# Patient Record
Sex: Male | Born: 1985 | Race: White | Hispanic: No | Marital: Single | Smoking: Current every day smoker
Health system: Southern US, Community
[De-identification: ages and names within clinical notes are randomized; demographics above are authoritative.]

## PROBLEM LIST (undated history)

## (undated) DIAGNOSIS — F319 Bipolar disorder, unspecified: Secondary | ICD-10-CM

## (undated) DIAGNOSIS — F32A Depression, unspecified: Secondary | ICD-10-CM

## (undated) DIAGNOSIS — G919 Hydrocephalus, unspecified: Secondary | ICD-10-CM

## (undated) DIAGNOSIS — E119 Type 2 diabetes mellitus without complications: Secondary | ICD-10-CM

## (undated) DIAGNOSIS — R569 Unspecified convulsions: Secondary | ICD-10-CM

## (undated) DIAGNOSIS — F329 Major depressive disorder, single episode, unspecified: Secondary | ICD-10-CM

## (undated) DIAGNOSIS — N200 Calculus of kidney: Secondary | ICD-10-CM

## (undated) DIAGNOSIS — F419 Anxiety disorder, unspecified: Secondary | ICD-10-CM

## (undated) DIAGNOSIS — E1042 Type 1 diabetes mellitus with diabetic polyneuropathy: Secondary | ICD-10-CM

## (undated) DIAGNOSIS — R278 Other lack of coordination: Secondary | ICD-10-CM

## (undated) HISTORY — PX: KIDNEY STONE SURGERY: SHX686

## (undated) HISTORY — DX: Other lack of coordination: R27.8

## (undated) HISTORY — PX: BRAIN SURGERY: SHX531

## (undated) NOTE — *Deleted (*Deleted)
Physical Medicine and Rehabilitation Admission H&P    Chief Complaint  Patient presents with  . Shortness of Breath  . Blood Sugar Problem  : HPI: Mark [name] is a 44 year old right-handed male with history of bipolar disorder, diabetes mellitus with frequent episodes of DKA, history of seizures maintained on Depakote, hydrocephalus, tobacco abuse.  At baseline patient reports being independent.  Presented 09/02/2020 after being found down by roommates.  Cranial CT scan showed persistent marked hydrocephalus involving the third and lateral ventricles with normal fourth ventricle and right frontal ventriculostomy site with drain.  Findings consistent with aqueductal stenosis.  EEG suggestive of severe diffuse encephalopathy no seizure activity.  Patient did require intubation for airway protection.  Admission chemistries potassium 7.0 glucose 646 creatinine 2.25, sodium 130, WBC 33,100, lactic acid 6.9, urine drug screen negative, ammonia level 38, AST 364, ALT 157, alcohol negative.  Neurology follow-up he was extubated 09/04/2020.  He remains on Depakote as prior to admission.  His diet has been advanced to mechanical soft.  Insulin therapy as directed with close monitoring of blood sugars.  His renal function is greatly improved with gentle IV hydration latest creatinine 0.94.  Initially maintained on broad-spectrum antibiotics leukocytosis improved 10,100.  Blood cultures no growth to date.  Therapy evaluations completed and patient was admitted for a comprehensive rehab program.  Review of Systems  Constitutional: Positive for diaphoresis. Negative for chills and fever.  HENT: Negative for hearing loss.   Eyes: Negative for blurred vision and double vision.  Respiratory: Negative for cough.   Cardiovascular: Positive for leg swelling. Negative for chest pain and palpitations.  Gastrointestinal: Positive for constipation. Negative for heartburn, nausea and vomiting.  Genitourinary:  Negative for dysuria, flank pain and hematuria.  Musculoskeletal: Positive for myalgias.  Skin: Negative for rash.  Neurological: Positive for seizures.  Psychiatric/Behavioral: Positive for depression. The patient has insomnia.        Anxiety, bipolar disorder  All other systems reviewed and are negative.  Past Medical History:  Diagnosis Date  . Anxiety   . Bipolar disorder (HCC)   . Depression   . Diabetes mellitus without complication (HCC)   . Diabetic polyneuropathy associated with type 1 diabetes mellitus (HCC)   . Hydrocephalus (HCC)   . Kidney stones   . Seizures (HCC)    Past Surgical History:  Procedure Laterality Date  . KIDNEY STONE SURGERY     Family History  Problem Relation Age of Onset  . Breast cancer Mother    Social History:  reports that he has been smoking cigarettes. He has a 20.00 pack-year smoking history. He has never used smokeless tobacco. He reports current alcohol use. He reports current drug use. Frequency: 7.00 times per week. Drug: Marijuana. Allergies:  Allergies  Allergen Reactions  . Mushroom Extract Complex Hives, Itching and Nausea And Vomiting  . Penicillins Anaphylaxis, Hives and Swelling    Has patient had a PCN reaction causing immediate rash, facial/tongue/throat swelling, SOB or lightheadedness with hypotension: Yes Has patient had a PCN reaction causing severe rash involving mucus membranes or skin necrosis: No Has patient had a PCN reaction that required hospitalization: Yes Has patient had a PCN reaction occurring within the last 10 years: Yes If all of the above answers are "NO", then may proceed with Cephalosporin use. Tolerated ceftriaxone 03/24/20  . Sulfa Antibiotics Anaphylaxis and Hives  . Clindamycin/Lincomycin Hives   Medications Prior to Admission  Medication Sig Dispense Refill  . insulin aspart protamine-  aspart (NOVOLOG MIX 70/30) (70-30) 100 UNIT/ML injection Inject 0.35 mLs (35 Units total) into the skin 2 (two)  times daily with a meal. 10 mL 0  . atorvastatin (LIPITOR) 20 MG tablet Take 1 tablet (20 mg total) by mouth daily. 30 tablet 1  . blood glucose meter kit and supplies KIT Dispense based on patient and insurance preference. Use up to four times daily as directed. (FOR ICD-9 250.00, 250.01). (Patient not taking: Reported on 06/20/2020) 1 each 0  . divalproex (DEPAKOTE) 250 MG DR tablet Take 3 tablets (750 mg total) by mouth every evening. For mood stabilization (Patient not taking: Reported on 06/20/2020) 90 tablet 1  . divalproex (DEPAKOTE) 500 MG DR tablet Take 1 tablet (500 mg total) by mouth daily. For mood stabilization (Patient not taking: Reported on 06/20/2020) 30 tablet 0  . escitalopram (LEXAPRO) 20 MG tablet Take 1 tablet (20 mg total) by mouth daily. For depression (Patient not taking: Reported on 06/20/2020) 30 tablet 0  . gabapentin (NEURONTIN) 100 MG capsule Take 100 mg by mouth 2 (two) times daily. (Patient not taking: Reported on 06/20/2020)    . Insulin Pen Needle (PEN NEEDLES) 31G X 5 MM MISC 1 Container by Does not apply route 4 (four) times daily -  with meals and at bedtime. 100 each 0  . Insulin Syringes, Disposable, U-100 0.5 ML MISC For insulin 100 each 0  . levothyroxine (SYNTHROID) 100 MCG tablet Take 1 tablet (100 mcg total) by mouth daily before breakfast. (Patient not taking: Reported on 06/20/2020) 30 tablet 0  . QUEtiapine (SEROQUEL) 300 MG tablet Take 1 tablet (300 mg total) by mouth at bedtime. For mood control (Patient not taking: Reported on 06/20/2020) 30 tablet 0    Drug Regimen Review Drug regimen was reviewed and remains appropriate with no significant issues identified  Home: Home Living Family/patient expects to be discharged to:: Shelter/Homeless Available Help at Discharge: Friend(s), Available PRN/intermittently (unsure of level of help at discharge) Type of Home: House Additional Comments: pt reporting to PT start of session he is homeless, later in session  reports was living with his mother until she passed approx 6 months ago   Lives With: Other (Comment) (housemate)   Functional History: Prior Function Level of Independence: Independent  Functional Status:  Mobility: Bed Mobility Overal bed mobility: Needs Assistance Bed Mobility: Rolling, Sidelying to Sit Rolling: Mod assist, +2 for physical assistance Sidelying to sit: Mod assist, +2 for physical assistance General bed mobility comments: Max assist trunk and LE management, scooting to EOB. Transfers Overall transfer level: Needs assistance Equipment used: Ambulation equipment used Transfer via Lift Equipment: Stedy Transfers: Sit to/from Stand Sit to Stand: Mod assist, +2 physical assistance Stand pivot transfers: Total assist, +2 physical assistance, +2 safety/equipment General transfer comment: max +2 for power up, hip extension, steadying, and sustained standing via posterior support. Stand x1 from EOB, x1 from stedy seat. Ambulation/Gait General Gait Details: unable this day    ADL: ADL Overall ADL's : Needs assistance/impaired Eating/Feeding: NPO Grooming: Maximal assistance, Sitting, Wash/dry face, Brushing hair Grooming Details (indicate cue type and reason): requires assist to support elbow/forearm for basic face washing task; requires hand over hand assist for combing hair due to decreased fine motor. using LUE to perform today  Upper Body Bathing: Moderate assistance, Sitting Lower Body Bathing: Total assistance, +2 for physical assistance, Bed level Lower Body Bathing Details (indicate cue type and reason): pt. incontinent of large BM, was unaware until reviewed with him.  Upper Body Dressing : Maximal assistance, Sitting, Bed level Upper Body Dressing Details (indicate cue type and reason): max inst. and hand over hand assistance to place arms in sleeves of gown Lower Body Dressing: Total assistance, +2 for physical assistance, Sitting/lateral leans, Sit to/from  stand, +2 for safety/equipment Toilet Transfer: Total assistance, +2 for physical assistance, +2 for safety/equipment, BSC Toilet Transfer Details (indicate cue type and reason): via Stedy, simulated via transfer to recliner  Toileting- Clothing Manipulation and Hygiene: Total assistance, +2 for physical assistance, +2 for safety/equipment, Bed level Functional mobility during ADLs: Maximal assistance, +2 for physical assistance, +2 for safety/equipment, Total assistance  Cognition: Cognition Overall Cognitive Status: No family/caregiver present to determine baseline cognitive functioning Arousal/Alertness: Awake/alert Orientation Level: Oriented to person, Oriented to situation, Disoriented to time, Disoriented to situation Attention: Sustained Sustained Attention: Impaired Sustained Attention Impairment: Verbal basic, Functional basic Memory: Impaired Memory Impairment: Other (comment) (recalled 1/5 words after 3 minute delay) Awareness: Impaired Awareness Impairment: Emergent impairment Problem Solving: Impaired Problem Solving Impairment: Verbal basic Behaviors: Impulsive, Verbal agitation Safety/Judgment: Impaired Cognition Arousal/Alertness: Awake/alert Behavior During Therapy: Flat affect Overall Cognitive Status: No family/caregiver present to determine baseline cognitive functioning General Comments: Pt did not respond to verbal cues alone. He required tactile cues and assistance physically to initiate all tasks. Pt smiles occasionally and laughs appropriately during session.   Physical Exam: Blood pressure 115/71, pulse 80, temperature 98.2 F (36.8 C), temperature source Oral, resp. rate 18, height 6\' 2"  (1.88 m), weight 97.4 kg, SpO2 96 %. Physical Exam Neurological:     Comments: Patient is alert.  Makes eye contact with examiner.  He  needed cues for day of the week but appropriate for year and month.  Follows simple commands.     Results for orders placed or  performed during the hospital encounter of 09/02/20 (from the past 48 hour(s))  Glucose, capillary     Status: Abnormal   Collection Time: 09/10/20 12:11 PM  Result Value Ref Range   Glucose-Capillary 270 (H) 70 - 99 mg/dL    Comment: Glucose reference range applies only to samples taken after fasting for at least 8 hours.  Glucose, capillary     Status: Abnormal   Collection Time: 09/10/20  4:17 PM  Result Value Ref Range   Glucose-Capillary 356 (H) 70 - 99 mg/dL    Comment: Glucose reference range applies only to samples taken after fasting for at least 8 hours.  Glucose, capillary     Status: Abnormal   Collection Time: 09/10/20  8:34 PM  Result Value Ref Range   Glucose-Capillary 247 (H) 70 - 99 mg/dL    Comment: Glucose reference range applies only to samples taken after fasting for at least 8 hours.  Basic metabolic panel     Status: Abnormal   Collection Time: 09/11/20  1:58 AM  Result Value Ref Range   Sodium 136 135 - 145 mmol/L   Potassium 4.2 3.5 - 5.1 mmol/L   Chloride 103 98 - 111 mmol/L   CO2 25 22 - 32 mmol/L   Glucose, Bld 326 (H) 70 - 99 mg/dL    Comment: Glucose reference range applies only to samples taken after fasting for at least 8 hours.   BUN 14 6 - 20 mg/dL   Creatinine, Ser 1.61 0.61 - 1.24 mg/dL   Calcium 9.3 8.9 - 09.6 mg/dL   GFR, Estimated >04 >54 mL/min    Comment: (NOTE) Calculated using the CKD-EPI Creatinine Equation (2021)  Anion gap 8 5 - 15    Comment: Performed at [facility], [address] [place], [place] [address]  CBC     Status: Abnormal   Collection Time: 09/11/20  1:58 AM  Result Value Ref Range   WBC 10.1 4.0 - 10.5 K/uL   RBC 3.93 (L) 4.22 - 5.81 MIL/uL   Hemoglobin 11.8 (L) 13.0 - 17.0 g/dL   HCT 56.2 (L) 39 - 52 %   MCV 89.1 80.0 - 100.0 fL   MCH 30.0 26.0 - 34.0 pg   MCHC 33.7 30.0 - 36.0 g/dL   RDW 13.0 86.5 - 78.4 %   Platelets 383 150 - 400 K/uL   nRBC 0.0 0.0 - 0.2 %    Comment: Performed at [facility], [address], [place], [place] [address]  Magnesium     Status: None   Collection Time: 09/11/20  1:58 AM  Result Value Ref Range   Magnesium 1.7 1.7 - 2.4 mg/dL    Comment: Performed at [facility], [address] [place], [place] [address]  Glucose, capillary     Status: Abnormal   Collection Time: 09/11/20  6:04 AM  Result Value Ref Range   Glucose-Capillary 335 (H) 70 - 99 mg/dL    Comment: Glucose reference range applies only to samples taken after fasting for at least 8 hours.  Glucose, capillary     Status: Abnormal   Collection Time: 09/11/20  8:27 AM  Result Value Ref Range   Glucose-Capillary 433 (H) 70 - 99 mg/dL    Comment: Glucose reference range applies only to samples taken after fasting for at least 8 hours.  Glucose, random     Status: Abnormal   Collection Time: 09/11/20  9:51 AM  Result Value Ref Range   Glucose, Bld 588 (HH) 70 - 99 mg/dL    Comment: Glucose reference range applies only to samples taken after fasting for at least 8 hours. CRITICAL RESULT CALLED TO, READ BACK BY AND VERIFIED WITH: [name],M RN @ 1038 09/11/20 LEONARD,A Performed at [facility], [address], [place], [place] [address]   Glucose, capillary     Status: Abnormal   Collection Time: 09/11/20 10:23 AM  Result Value Ref Range   Glucose-Capillary 468 (H) 70 - 99 mg/dL    Comment: Glucose reference range applies only to samples taken after fasting for at least 8 hours.  Glucose, capillary     Status: Abnormal   Collection Time: 09/11/20  2:02 PM  Result Value Ref Range   Glucose-Capillary 398 (H) 70 - 99 mg/dL    Comment: Glucose reference range applies only to samples taken after fasting for at least 8 hours.  Glucose, capillary     Status: Abnormal   Collection Time: 09/11/20  3:41 PM  Result Value Ref Range   Glucose-Capillary 175 (H) 70 - 99 mg/dL    Comment: Glucose reference range applies only to samples taken after fasting for at least 8 hours.   Glucose, capillary     Status: Abnormal   Collection Time: 09/11/20  6:35 PM  Result Value Ref Range   Glucose-Capillary 128 (H) 70 - 99 mg/dL    Comment: Glucose reference range applies only to samples taken after fasting for at least 8 hours.   Comment 1 Notify RN   Glucose, capillary     Status: Abnormal   Collection Time: 09/11/20  9:16 PM  Result Value Ref  Range   Glucose-Capillary 107 (H) 70 - 99 mg/dL    Comment: Glucose reference range applies only to samples taken after fasting for at least 8 hours.   Comment 1 Notify RN    Comment 2 Document in Chart   Glucose, capillary     Status: None   Collection Time: 09/12/20  6:08 AM  Result Value Ref Range   Glucose-Capillary 87 70 - 99 mg/dL    Comment: Glucose reference range applies only to samples taken after fasting for at least 8 hours.   Comment 1 Notify RN    Comment 2 Document in Chart   Glucose, capillary     Status: Abnormal   Collection Time: 09/12/20  9:13 AM  Result Value Ref Range   Glucose-Capillary 217 (H) 70 - 99 mg/dL    Comment: Glucose reference range applies only to samples taken after fasting for at least 8 hours.   Comment 1 Notify RN    Comment 2 Document in Chart    No results found.     Medical Problem List and Plan: 1.  Altered mental status secondary to metabolic encephalopathy chronic hydrocephalus/acute hypoxic respiratory failure compromised airway and aspiration pneumonitis  -patient may *** shower  -ELOS/Goals: *** 2.  Antithrombotics: -DVT/anticoagulation: SCDs  -antiplatelet therapy: N/A 3. Pain Management: Tylenol as needed 4. Mood: Lexapro 20 mg daily  -antipsychotic agents: Seroquel 300 mg nightly 5. Neuropsych: This patient is not capable of making decisions on his own behalf. 6. Skin/Wound Care: Routine skin checks 7. Fluids/Electrolytes/Nutrition: Routine in and outs with follow-up chemistries 8.  Seizure disorder.  Depakote 500 mg daily and 750 mg nightly 9.  Diabetes  mellitus peripheral neuropathy with multiple episodes of DKA.  NovoLog 70/30 35 units twice daily.  Check blood sugars before meals and at bedtime.  Diabetic teaching 10.  Hypothyroidism.  Synthroid 11.  Hyperlipidemia.  Lipitor 12.  History of tobacco abuse.  Counseling   ***  Charlton Amor, PA-C 09/12/2020

---

## 2004-08-18 ENCOUNTER — Emergency Department: Payer: Self-pay | Admitting: Emergency Medicine

## 2005-04-11 ENCOUNTER — Emergency Department: Payer: Self-pay | Admitting: Unknown Physician Specialty

## 2006-01-16 ENCOUNTER — Emergency Department: Payer: Self-pay | Admitting: Emergency Medicine

## 2006-01-17 ENCOUNTER — Emergency Department: Payer: Self-pay | Admitting: Internal Medicine

## 2006-02-07 ENCOUNTER — Emergency Department: Payer: Self-pay | Admitting: Emergency Medicine

## 2006-03-01 ENCOUNTER — Emergency Department: Payer: Self-pay | Admitting: Emergency Medicine

## 2006-04-28 ENCOUNTER — Emergency Department: Payer: Self-pay | Admitting: Emergency Medicine

## 2006-04-30 ENCOUNTER — Emergency Department: Payer: Self-pay | Admitting: Unknown Physician Specialty

## 2006-09-13 ENCOUNTER — Emergency Department: Payer: Self-pay | Admitting: Emergency Medicine

## 2006-10-30 ENCOUNTER — Emergency Department: Payer: Self-pay

## 2007-01-01 ENCOUNTER — Emergency Department: Payer: Self-pay | Admitting: Emergency Medicine

## 2007-01-09 ENCOUNTER — Emergency Department: Payer: Self-pay | Admitting: Emergency Medicine

## 2007-04-22 ENCOUNTER — Emergency Department: Payer: Self-pay | Admitting: Internal Medicine

## 2007-04-28 ENCOUNTER — Emergency Department: Payer: Self-pay | Admitting: Internal Medicine

## 2007-06-29 ENCOUNTER — Emergency Department: Payer: Self-pay | Admitting: Emergency Medicine

## 2007-07-20 ENCOUNTER — Emergency Department: Payer: Self-pay | Admitting: Emergency Medicine

## 2007-07-30 ENCOUNTER — Emergency Department: Payer: Self-pay | Admitting: Unknown Physician Specialty

## 2007-07-31 ENCOUNTER — Emergency Department: Payer: Self-pay | Admitting: Emergency Medicine

## 2007-08-02 ENCOUNTER — Emergency Department: Payer: Self-pay | Admitting: Emergency Medicine

## 2007-08-04 ENCOUNTER — Emergency Department: Payer: Self-pay | Admitting: Emergency Medicine

## 2007-10-18 ENCOUNTER — Emergency Department: Payer: Self-pay | Admitting: Emergency Medicine

## 2007-11-19 ENCOUNTER — Emergency Department: Payer: Self-pay | Admitting: Emergency Medicine

## 2007-12-06 ENCOUNTER — Emergency Department: Payer: Self-pay | Admitting: Emergency Medicine

## 2008-01-18 ENCOUNTER — Emergency Department: Payer: Self-pay | Admitting: Emergency Medicine

## 2008-03-05 ENCOUNTER — Emergency Department: Payer: Self-pay | Admitting: Unknown Physician Specialty

## 2008-04-07 ENCOUNTER — Emergency Department: Payer: Self-pay | Admitting: Unknown Physician Specialty

## 2008-05-30 ENCOUNTER — Emergency Department: Payer: Self-pay | Admitting: Emergency Medicine

## 2008-06-21 ENCOUNTER — Emergency Department: Payer: Self-pay | Admitting: Emergency Medicine

## 2008-07-04 ENCOUNTER — Emergency Department: Payer: Self-pay | Admitting: Emergency Medicine

## 2008-07-05 ENCOUNTER — Emergency Department: Payer: Self-pay | Admitting: Emergency Medicine

## 2008-07-31 ENCOUNTER — Emergency Department: Payer: Self-pay | Admitting: Emergency Medicine

## 2008-08-01 ENCOUNTER — Emergency Department: Payer: Self-pay | Admitting: Internal Medicine

## 2009-03-23 ENCOUNTER — Emergency Department: Payer: Self-pay | Admitting: Emergency Medicine

## 2009-04-12 ENCOUNTER — Emergency Department: Payer: Self-pay | Admitting: Emergency Medicine

## 2009-05-26 ENCOUNTER — Emergency Department: Payer: Self-pay | Admitting: Emergency Medicine

## 2009-05-30 ENCOUNTER — Emergency Department: Payer: Self-pay | Admitting: Emergency Medicine

## 2009-06-03 ENCOUNTER — Emergency Department: Payer: Self-pay | Admitting: Emergency Medicine

## 2009-06-10 ENCOUNTER — Emergency Department: Payer: Self-pay | Admitting: Emergency Medicine

## 2009-06-14 ENCOUNTER — Emergency Department: Payer: Self-pay

## 2009-06-19 ENCOUNTER — Emergency Department: Payer: Self-pay | Admitting: Unknown Physician Specialty

## 2009-06-28 ENCOUNTER — Emergency Department: Payer: Self-pay | Admitting: Emergency Medicine

## 2009-07-03 ENCOUNTER — Emergency Department: Payer: Self-pay | Admitting: Emergency Medicine

## 2009-07-06 ENCOUNTER — Emergency Department: Payer: Self-pay | Admitting: Emergency Medicine

## 2009-07-06 ENCOUNTER — Emergency Department: Payer: Self-pay | Admitting: Unknown Physician Specialty

## 2009-07-28 ENCOUNTER — Emergency Department: Payer: Self-pay | Admitting: Emergency Medicine

## 2009-08-06 ENCOUNTER — Emergency Department: Payer: Self-pay | Admitting: Emergency Medicine

## 2009-08-09 ENCOUNTER — Emergency Department: Payer: Self-pay | Admitting: Emergency Medicine

## 2009-08-18 ENCOUNTER — Ambulatory Visit: Payer: Self-pay | Admitting: Urology

## 2009-08-19 ENCOUNTER — Emergency Department: Payer: Self-pay | Admitting: Unknown Physician Specialty

## 2009-08-22 ENCOUNTER — Emergency Department: Payer: Self-pay | Admitting: Emergency Medicine

## 2009-09-12 ENCOUNTER — Emergency Department: Payer: Self-pay | Admitting: Emergency Medicine

## 2009-10-02 ENCOUNTER — Emergency Department: Payer: Self-pay | Admitting: Unknown Physician Specialty

## 2009-11-15 ENCOUNTER — Emergency Department: Payer: Self-pay | Admitting: Emergency Medicine

## 2009-11-18 ENCOUNTER — Emergency Department: Payer: Self-pay | Admitting: Emergency Medicine

## 2009-11-28 ENCOUNTER — Emergency Department: Payer: Self-pay | Admitting: Emergency Medicine

## 2009-12-10 ENCOUNTER — Emergency Department: Payer: Self-pay | Admitting: Emergency Medicine

## 2010-01-19 ENCOUNTER — Emergency Department: Payer: Self-pay | Admitting: Emergency Medicine

## 2010-02-14 ENCOUNTER — Emergency Department: Payer: Self-pay | Admitting: Emergency Medicine

## 2010-03-05 ENCOUNTER — Emergency Department: Payer: Self-pay | Admitting: Internal Medicine

## 2010-03-30 ENCOUNTER — Emergency Department: Payer: Self-pay | Admitting: Emergency Medicine

## 2010-04-10 ENCOUNTER — Emergency Department: Payer: Self-pay | Admitting: Emergency Medicine

## 2010-04-12 ENCOUNTER — Emergency Department: Payer: Self-pay | Admitting: Emergency Medicine

## 2010-07-12 ENCOUNTER — Emergency Department: Payer: Self-pay | Admitting: Emergency Medicine

## 2010-08-12 ENCOUNTER — Emergency Department: Payer: Self-pay | Admitting: Emergency Medicine

## 2010-08-13 ENCOUNTER — Emergency Department: Payer: Self-pay | Admitting: Emergency Medicine

## 2010-08-18 ENCOUNTER — Emergency Department: Payer: Self-pay | Admitting: Emergency Medicine

## 2010-08-19 ENCOUNTER — Emergency Department: Payer: Self-pay | Admitting: Unknown Physician Specialty

## 2010-09-07 ENCOUNTER — Emergency Department: Payer: Self-pay | Admitting: Emergency Medicine

## 2010-09-13 ENCOUNTER — Emergency Department: Payer: Self-pay | Admitting: Emergency Medicine

## 2010-09-19 ENCOUNTER — Emergency Department: Payer: Self-pay | Admitting: Emergency Medicine

## 2010-09-24 ENCOUNTER — Inpatient Hospital Stay: Payer: Self-pay | Admitting: Internal Medicine

## 2010-09-26 ENCOUNTER — Emergency Department: Payer: Self-pay | Admitting: Emergency Medicine

## 2010-10-03 ENCOUNTER — Emergency Department: Payer: Self-pay | Admitting: Emergency Medicine

## 2010-10-05 ENCOUNTER — Emergency Department: Payer: Self-pay

## 2010-10-09 ENCOUNTER — Emergency Department: Payer: Self-pay | Admitting: Unknown Physician Specialty

## 2010-10-12 ENCOUNTER — Emergency Department: Payer: Self-pay | Admitting: Emergency Medicine

## 2010-10-23 ENCOUNTER — Emergency Department: Payer: Self-pay | Admitting: Emergency Medicine

## 2010-11-11 ENCOUNTER — Emergency Department: Payer: Self-pay | Admitting: Unknown Physician Specialty

## 2010-11-25 ENCOUNTER — Inpatient Hospital Stay: Payer: Self-pay | Admitting: *Deleted

## 2010-12-10 ENCOUNTER — Emergency Department: Payer: Self-pay | Admitting: Emergency Medicine

## 2010-12-11 ENCOUNTER — Emergency Department: Payer: Self-pay | Admitting: Emergency Medicine

## 2010-12-18 ENCOUNTER — Emergency Department: Payer: Self-pay | Admitting: Emergency Medicine

## 2011-01-22 ENCOUNTER — Emergency Department: Payer: Self-pay | Admitting: Emergency Medicine

## 2011-03-11 ENCOUNTER — Emergency Department: Payer: Self-pay | Admitting: Emergency Medicine

## 2011-03-18 ENCOUNTER — Emergency Department: Payer: Self-pay | Admitting: Emergency Medicine

## 2011-03-26 ENCOUNTER — Emergency Department: Payer: Self-pay | Admitting: Emergency Medicine

## 2011-04-21 ENCOUNTER — Emergency Department: Payer: Self-pay | Admitting: Unknown Physician Specialty

## 2011-05-10 ENCOUNTER — Emergency Department: Payer: Self-pay | Admitting: Emergency Medicine

## 2011-05-11 ENCOUNTER — Emergency Department: Payer: Self-pay | Admitting: *Deleted

## 2011-06-06 ENCOUNTER — Emergency Department: Payer: Self-pay | Admitting: *Deleted

## 2011-06-12 ENCOUNTER — Emergency Department: Payer: Self-pay | Admitting: Emergency Medicine

## 2011-06-15 ENCOUNTER — Emergency Department: Payer: Self-pay | Admitting: *Deleted

## 2011-09-15 ENCOUNTER — Inpatient Hospital Stay: Payer: Self-pay | Admitting: Internal Medicine

## 2011-10-13 ENCOUNTER — Emergency Department: Payer: Self-pay | Admitting: *Deleted

## 2011-12-16 ENCOUNTER — Emergency Department: Payer: Self-pay | Admitting: Emergency Medicine

## 2012-02-10 ENCOUNTER — Inpatient Hospital Stay: Payer: Self-pay | Admitting: Internal Medicine

## 2012-02-10 LAB — URINALYSIS, COMPLETE
Bacteria: NONE SEEN
Bilirubin,UR: NEGATIVE
Squamous Epithelial: <1
WBC UR: 1 /HPF (ref 0–5)

## 2012-02-10 LAB — BASIC METABOLIC PANEL
Chloride: 92 mmol/L — ABNORMAL LOW (ref 98–107)
Creatinine: 1.69 mg/dL — ABNORMAL HIGH (ref 0.60–1.30)
EGFR (African American): >60
EGFR (Non-African Amer.): 54 — ABNORMAL LOW
Glucose: 579 mg/dL (ref 65–99)
Osmolality: 290 (ref 275–301)
Potassium: 5.5 mmol/L — ABNORMAL HIGH (ref 3.5–5.1)
Sodium: 130 mmol/L — ABNORMAL LOW (ref 136–145)

## 2012-02-10 LAB — CBC
HGB: 16 g/dL (ref 13.0–18.0)
MCV: 101 fL — ABNORMAL HIGH (ref 80–100)
Platelet: 335 10*3/uL (ref 150–440)
RBC: 5.09 10*6/uL (ref 4.40–5.90)

## 2012-02-11 LAB — DRUG SCREEN, URINE
Amphetamines, Ur Screen: NEGATIVE (ref ?–1000)
Barbiturates, Ur Screen: NEGATIVE (ref ?–200)
Benzodiazepine, Ur Scrn: NEGATIVE (ref ?–200)
Cannabinoid 50 Ng, Ur ~~LOC~~: POSITIVE (ref ?–50)
Cocaine Metabolite,Ur ~~LOC~~: NEGATIVE (ref ?–300)
Opiate, Ur Screen: POSITIVE (ref ?–300)
Tricyclic, Ur Screen: NEGATIVE (ref ?–1000)

## 2012-02-11 LAB — COMPREHENSIVE METABOLIC PANEL
Alkaline Phosphatase: 88 U/L (ref 50–136)
Anion Gap: 12 (ref 7–16)
BUN: 9 mg/dL (ref 7–18)
Bilirubin,Total: 0.5 mg/dL (ref 0.2–1.0)
Calcium, Total: 8.1 mg/dL — ABNORMAL LOW (ref 8.5–10.1)
Chloride: 106 mmol/L (ref 98–107)
Creatinine: 1.25 mg/dL (ref 0.60–1.30)
EGFR (African American): >60
Osmolality: 277 (ref 275–301)
Potassium: 3.7 mmol/L (ref 3.5–5.1)
SGPT (ALT): 17 U/L
Sodium: 137 mmol/L (ref 136–145)

## 2012-02-11 LAB — CBC WITH DIFFERENTIAL/PLATELET
Basophil #: 0.1 10*3/uL (ref 0.0–0.1)
Basophil %: 0.9 %
Eosinophil %: 0.8 %
HCT: 37.2 % — ABNORMAL LOW (ref 40.0–52.0)
HGB: 12.4 g/dL — ABNORMAL LOW (ref 13.0–18.0)
Lymphocyte #: 2.9 10*3/uL (ref 1.0–3.6)
Lymphocyte %: 21.3 %
MCH: 31.3 pg (ref 26.0–34.0)
MCHC: 33.4 g/dL (ref 32.0–36.0)
MCV: 94 fL (ref 80–100)
Monocyte #: 0.8 x10 3/mm (ref 0.2–1.0)
Neutrophil #: 9.9 10*3/uL — ABNORMAL HIGH (ref 1.4–6.5)
RDW: 12.7 % (ref 11.5–14.5)
WBC: 13.8 10*3/uL — ABNORMAL HIGH (ref 3.8–10.6)

## 2012-02-12 LAB — CBC WITH DIFFERENTIAL/PLATELET
Basophil %: 0.2 %
Eosinophil #: 0.2 10*3/uL (ref 0.0–0.7)
Eosinophil %: 1.7 %
HCT: 37 % — ABNORMAL LOW (ref 40.0–52.0)
HGB: 12.5 g/dL — ABNORMAL LOW (ref 13.0–18.0)
Lymphocyte #: 2.6 10*3/uL (ref 1.0–3.6)
MCH: 31.7 pg (ref 26.0–34.0)
MCHC: 33.9 g/dL (ref 32.0–36.0)
Monocyte #: 0.5 x10 3/mm (ref 0.2–1.0)
Monocyte %: 5.5 %
Neutrophil #: 6.5 10*3/uL (ref 1.4–6.5)
Neutrophil %: 66 %
Platelet: 165 10*3/uL (ref 150–440)
RDW: 12.8 % (ref 11.5–14.5)
WBC: 9.8 10*3/uL (ref 3.8–10.6)

## 2012-02-12 LAB — LIPID PANEL
HDL Cholesterol: 36 mg/dL — ABNORMAL LOW (ref 40–60)
Triglycerides: 332 mg/dL — ABNORMAL HIGH (ref 0–200)

## 2012-02-12 LAB — HEMOGLOBIN A1C: Hemoglobin A1C: 10.3 % — ABNORMAL HIGH (ref 4.2–6.3)

## 2012-02-12 LAB — GLUCOSE, RANDOM: Glucose: 298 mg/dL — ABNORMAL HIGH (ref 65–99)

## 2012-02-12 LAB — CALCIUM: Calcium, Total: 8.5 mg/dL (ref 8.5–10.1)

## 2012-02-20 ENCOUNTER — Inpatient Hospital Stay: Payer: Self-pay | Admitting: Internal Medicine

## 2012-02-20 LAB — BASIC METABOLIC PANEL
Anion Gap: 6 — ABNORMAL LOW (ref 7–16)
BUN: 16 mg/dL (ref 7–18)
Calcium, Total: 8.6 mg/dL (ref 8.5–10.1)
Chloride: 95 mmol/L — ABNORMAL LOW (ref 98–107)
Creatinine: 0.87 mg/dL (ref 0.60–1.30)
Glucose: 231 mg/dL — ABNORMAL HIGH (ref 65–99)
Osmolality: 271 (ref 275–301)
Sodium: 131 mmol/L — ABNORMAL LOW (ref 136–145)

## 2012-02-20 LAB — COMPREHENSIVE METABOLIC PANEL
BUN: 32 mg/dL — ABNORMAL HIGH (ref 7–18)
Bilirubin,Total: 0.4 mg/dL (ref 0.2–1.0)
Co2: 26 mmol/L (ref 21–32)
Creatinine: 1.14 mg/dL (ref 0.60–1.30)
EGFR (African American): >60
EGFR (Non-African Amer.): >60
Potassium: 4.9 mmol/L (ref 3.5–5.1)
SGPT (ALT): 40 U/L
Total Protein: 8.6 g/dL — ABNORMAL HIGH (ref 6.4–8.2)

## 2012-02-20 LAB — DRUG SCREEN, URINE
Barbiturates, Ur Screen: NEGATIVE (ref ?–200)
Cocaine Metabolite,Ur ~~LOC~~: NEGATIVE (ref ?–300)
MDMA (Ecstasy)Ur Screen: NEGATIVE (ref ?–500)
Tricyclic, Ur Screen: NEGATIVE (ref ?–1000)

## 2012-02-20 LAB — URINALYSIS, COMPLETE
Bacteria: NONE SEEN
Blood: NEGATIVE
Glucose,UR: >=500 mg/dL (ref 0–75)
Leukocyte Esterase: NEGATIVE
Nitrite: NEGATIVE
Ph: 7 (ref 4.5–8.0)
Protein: NEGATIVE
Specific Gravity: 1.026 (ref 1.003–1.030)

## 2012-02-20 LAB — CBC
HCT: 44.9 % (ref 40.0–52.0)
MCHC: 32.3 g/dL (ref 32.0–36.0)
MCV: 100 fL (ref 80–100)
WBC: 7.8 10*3/uL (ref 3.8–10.6)

## 2012-02-21 LAB — BASIC METABOLIC PANEL
Anion Gap: 6 — ABNORMAL LOW (ref 7–16)
BUN: 10 mg/dL (ref 7–18)
Calcium, Total: 8.5 mg/dL (ref 8.5–10.1)
Co2: 27 mmol/L (ref 21–32)
Creatinine: 0.71 mg/dL (ref 0.60–1.30)
EGFR (African American): >60
Glucose: 212 mg/dL — ABNORMAL HIGH (ref 65–99)
Osmolality: 272 (ref 275–301)
Sodium: 133 mmol/L — ABNORMAL LOW (ref 136–145)

## 2012-02-21 LAB — CBC WITH DIFFERENTIAL/PLATELET
Basophil %: 0.2 %
Eosinophil #: 0.2 10*3/uL (ref 0.0–0.7)
Eosinophil %: 2.5 %
HCT: 34.9 % — ABNORMAL LOW (ref 40.0–52.0)
Lymphocyte %: 37 %
MCH: 32.1 pg (ref 26.0–34.0)
MCHC: 33.6 g/dL (ref 32.0–36.0)
MCV: 95 fL (ref 80–100)
Monocyte #: 0.6 x10 3/mm (ref 0.2–1.0)
Monocyte %: 8.1 %
Neutrophil %: 52.2 %
Platelet: 244 10*3/uL (ref 150–440)
RDW: 13.3 % (ref 11.5–14.5)

## 2012-02-21 LAB — HEMOGLOBIN A1C: Hemoglobin A1C: 10.7 % — ABNORMAL HIGH (ref 4.2–6.3)

## 2012-02-22 LAB — LIPID PANEL
Cholesterol: 220 mg/dL — ABNORMAL HIGH (ref 0–200)
HDL Cholesterol: 45 mg/dL (ref 40–60)
Triglycerides: 207 mg/dL — ABNORMAL HIGH (ref 0–200)
VLDL Cholesterol, Calc: 41 mg/dL — ABNORMAL HIGH (ref 5–40)

## 2012-03-14 ENCOUNTER — Inpatient Hospital Stay: Payer: Self-pay | Admitting: Internal Medicine

## 2012-03-14 LAB — CBC WITH DIFFERENTIAL/PLATELET
HCT: 40.8 % (ref 40.0–52.0)
HGB: 13.3 g/dL (ref 13.0–18.0)
Lymphocyte %: 10.9 %
MCHC: 32.6 g/dL (ref 32.0–36.0)
MCV: 95 fL (ref 80–100)
Monocyte #: 1 x10 3/mm (ref 0.2–1.0)
Neutrophil #: 14.7 10*3/uL — ABNORMAL HIGH (ref 1.4–6.5)
Neutrophil %: 82.7 %
RDW: 13.6 % (ref 11.5–14.5)
WBC: 17.7 10*3/uL — ABNORMAL HIGH (ref 3.8–10.6)

## 2012-03-14 LAB — COMPREHENSIVE METABOLIC PANEL
Albumin: 3.7 g/dL (ref 3.4–5.0)
Alkaline Phosphatase: 116 U/L (ref 50–136)
Anion Gap: 10 (ref 7–16)
Calcium, Total: 9.3 mg/dL (ref 8.5–10.1)
Co2: 25 mmol/L (ref 21–32)
EGFR (African American): >60
Glucose: 385 mg/dL — ABNORMAL HIGH (ref 65–99)
Potassium: 4.2 mmol/L (ref 3.5–5.1)
Sodium: 132 mmol/L — ABNORMAL LOW (ref 136–145)
Total Protein: 8.8 g/dL — ABNORMAL HIGH (ref 6.4–8.2)

## 2012-03-15 LAB — BASIC METABOLIC PANEL
Calcium, Total: 8.6 mg/dL (ref 8.5–10.1)
Chloride: 96 mmol/L — ABNORMAL LOW (ref 98–107)
Co2: 28 mmol/L (ref 21–32)
Creatinine: 0.58 mg/dL — ABNORMAL LOW (ref 0.60–1.30)
EGFR (African American): >60
Glucose: 265 mg/dL — ABNORMAL HIGH (ref 65–99)
Osmolality: 271 (ref 275–301)
Potassium: 3.9 mmol/L (ref 3.5–5.1)

## 2012-03-15 LAB — HEPATIC FUNCTION PANEL A (ARMC)
Bilirubin, Direct: 0.1 mg/dL (ref 0.00–0.20)
Bilirubin,Total: 0.5 mg/dL (ref 0.2–1.0)
SGPT (ALT): 10 U/L — ABNORMAL LOW

## 2012-03-15 LAB — CBC WITH DIFFERENTIAL/PLATELET
Basophil #: 0 10*3/uL (ref 0.0–0.1)
Eosinophil %: 1.6 %
HCT: 36.9 % — ABNORMAL LOW (ref 40.0–52.0)
HGB: 12.4 g/dL — ABNORMAL LOW (ref 13.0–18.0)
Lymphocyte %: 19.6 %
MCH: 31.4 pg (ref 26.0–34.0)
WBC: 11.7 10*3/uL — ABNORMAL HIGH (ref 3.8–10.6)

## 2012-03-16 LAB — BASIC METABOLIC PANEL
Anion Gap: 9 (ref 7–16)
BUN: 6 mg/dL — ABNORMAL LOW (ref 7–18)
Calcium, Total: 8.7 mg/dL (ref 8.5–10.1)
Chloride: 100 mmol/L (ref 98–107)
Co2: 28 mmol/L (ref 21–32)
Creatinine: 0.61 mg/dL (ref 0.60–1.30)
EGFR (African American): >60
EGFR (Non-African Amer.): >60
Glucose: 124 mg/dL — ABNORMAL HIGH (ref 65–99)
Osmolality: 273 (ref 275–301)
Potassium: 3.9 mmol/L (ref 3.5–5.1)
Sodium: 137 mmol/L (ref 136–145)

## 2012-03-16 LAB — CBC WITH DIFFERENTIAL/PLATELET
Basophil #: 0 10*3/uL (ref 0.0–0.1)
Basophil %: 0.2 %
Eosinophil #: 0.2 10*3/uL (ref 0.0–0.7)
Eosinophil %: 2.5 %
HCT: 34.4 % — ABNORMAL LOW (ref 40.0–52.0)
HGB: 11.8 g/dL — ABNORMAL LOW (ref 13.0–18.0)
Lymphocyte #: 2.2 10*3/uL (ref 1.0–3.6)
Lymphocyte %: 31.2 %
MCH: 31.8 pg (ref 26.0–34.0)
MCHC: 34.3 g/dL (ref 32.0–36.0)
MCV: 93 fL (ref 80–100)
Monocyte %: 6.5 %
Neutrophil #: 4.1 10*3/uL (ref 1.4–6.5)
Neutrophil %: 59.6 %
Platelet: 313 10*3/uL (ref 150–440)
RDW: 13.3 % (ref 11.5–14.5)
WBC: 6.9 10*3/uL (ref 3.8–10.6)

## 2012-03-18 LAB — PROT IMMUNOELECTROPHORES(ARMC)

## 2012-03-19 LAB — WOUND CULTURE

## 2012-04-20 ENCOUNTER — Inpatient Hospital Stay: Payer: Self-pay | Admitting: Internal Medicine

## 2012-04-20 LAB — CBC WITH DIFFERENTIAL/PLATELET
Basophil #: 0.1 10*3/uL (ref 0.0–0.1)
Basophil %: 0.5 %
Eosinophil #: 0.2 10*3/uL (ref 0.0–0.7)
Eosinophil %: 1.8 %
HCT: 39.4 % — ABNORMAL LOW (ref 40.0–52.0)
HGB: 12.5 g/dL — ABNORMAL LOW (ref 13.0–18.0)
Lymphocyte #: 2.1 10*3/uL (ref 1.0–3.6)
Lymphocyte %: 15.5 %
MCH: 31.1 pg (ref 26.0–34.0)
MCHC: 31.7 g/dL — ABNORMAL LOW (ref 32.0–36.0)
MCV: 98 fL (ref 80–100)
Monocyte #: 1.1 x10 3/mm — ABNORMAL HIGH (ref 0.2–1.0)
Monocyte %: 7.8 %
WBC: 13.5 10*3/uL — ABNORMAL HIGH (ref 3.8–10.6)

## 2012-04-20 LAB — COMPREHENSIVE METABOLIC PANEL
Alkaline Phosphatase: 93 U/L (ref 50–136)
Anion Gap: 24 — ABNORMAL HIGH (ref 7–16)
BUN: 24 mg/dL — ABNORMAL HIGH (ref 7–18)
Bilirubin,Total: 0.5 mg/dL (ref 0.2–1.0)
Chloride: 91 mmol/L — ABNORMAL LOW (ref 98–107)
Co2: 15 mmol/L — ABNORMAL LOW (ref 21–32)
EGFR (African American): >60
EGFR (Non-African Amer.): >60
Glucose: 684 mg/dL (ref 65–99)
Osmolality: 297 (ref 275–301)
Potassium: 4.7 mmol/L (ref 3.5–5.1)
SGOT(AST): 14 U/L — ABNORMAL LOW (ref 15–37)

## 2012-04-20 LAB — DRUG SCREEN, URINE
Barbiturates, Ur Screen: NEGATIVE (ref ?–200)
Cocaine Metabolite,Ur ~~LOC~~: NEGATIVE (ref ?–300)
MDMA (Ecstasy)Ur Screen: NEGATIVE (ref ?–500)
Methadone, Ur Screen: NEGATIVE (ref ?–300)
Tricyclic, Ur Screen: NEGATIVE (ref ?–1000)

## 2012-04-20 LAB — URINALYSIS, COMPLETE
Bilirubin,UR: NEGATIVE
Glucose,UR: >=500 mg/dL (ref 0–75)
Ph: 6 (ref 4.5–8.0)
Protein: NEGATIVE
Specific Gravity: 1.021 (ref 1.003–1.030)
WBC UR: <1 /HPF (ref 0–5)

## 2012-04-20 LAB — PROTIME-INR: Prothrombin Time: 12.8 secs (ref 11.5–14.7)

## 2012-04-21 LAB — CBC WITH DIFFERENTIAL/PLATELET
Basophil %: 0.6 %
Eosinophil %: 2.7 %
HGB: 10.5 g/dL — ABNORMAL LOW (ref 13.0–18.0)
Lymphocyte %: 26.5 %
MCH: 29.4 pg (ref 26.0–34.0)
MCHC: 31.5 g/dL — ABNORMAL LOW (ref 32.0–36.0)
MCV: 93 fL (ref 80–100)
Monocyte %: 8 %
Neutrophil #: 6.7 10*3/uL — ABNORMAL HIGH (ref 1.4–6.5)
Neutrophil %: 62.2 %
Platelet: 366 10*3/uL (ref 150–440)
RBC: 3.56 10*6/uL — ABNORMAL LOW (ref 4.40–5.90)
WBC: 10.8 10*3/uL — ABNORMAL HIGH (ref 3.8–10.6)

## 2012-04-21 LAB — COMPREHENSIVE METABOLIC PANEL
Anion Gap: 7 (ref 7–16)
BUN: 11 mg/dL (ref 7–18)
Calcium, Total: 8.4 mg/dL — ABNORMAL LOW (ref 8.5–10.1)
Co2: 26 mmol/L (ref 21–32)
EGFR (Non-African Amer.): >60
Osmolality: 270 (ref 275–301)
Potassium: 3.4 mmol/L — ABNORMAL LOW (ref 3.5–5.1)
SGOT(AST): 13 U/L — ABNORMAL LOW (ref 15–37)
Sodium: 136 mmol/L (ref 136–145)

## 2012-04-22 LAB — CBC WITH DIFFERENTIAL/PLATELET
Basophil #: 0 10*3/uL (ref 0.0–0.1)
Basophil %: 0.4 %
Eosinophil #: 0.3 10*3/uL (ref 0.0–0.7)
Eosinophil %: 2.7 %
HCT: 32.8 % — ABNORMAL LOW (ref 40.0–52.0)
HGB: 10.9 g/dL — ABNORMAL LOW (ref 13.0–18.0)
Lymphocyte %: 24.8 %
MCH: 31.3 pg (ref 26.0–34.0)
Monocyte #: 0.8 x10 3/mm (ref 0.2–1.0)
Neutrophil #: 6.3 10*3/uL (ref 1.4–6.5)
Neutrophil %: 64.3 %
RBC: 3.5 10*6/uL — ABNORMAL LOW (ref 4.40–5.90)

## 2012-04-22 LAB — FERRITIN: Ferritin (ARMC): 188 ng/mL (ref 8–388)

## 2012-04-22 LAB — IRON AND TIBC
Iron Saturation: 19 %
Iron: 41 ug/dL — ABNORMAL LOW (ref 65–175)

## 2012-06-17 ENCOUNTER — Inpatient Hospital Stay: Payer: Self-pay | Admitting: Internal Medicine

## 2012-06-17 LAB — BASIC METABOLIC PANEL
Anion Gap: 17 — ABNORMAL HIGH (ref 7–16)
Anion Gap: 10 (ref 7–16)
BUN: 22 mg/dL — ABNORMAL HIGH (ref 7–18)
BUN: 16 mg/dL (ref 7–18)
Calcium, Total: 9.8 mg/dL (ref 8.5–10.1)
Calcium, Total: 9.8 mg/dL (ref 8.5–10.1)
Chloride: 97 mmol/L — ABNORMAL LOW (ref 98–107)
Co2: 18 mmol/L — ABNORMAL LOW (ref 21–32)
Co2: 19 mmol/L — ABNORMAL LOW (ref 21–32)
Co2: 25 mmol/L (ref 21–32)
Creatinine: 1.33 mg/dL — ABNORMAL HIGH (ref 0.60–1.30)
EGFR (African American): >60
EGFR (African American): >60
EGFR (Non-African Amer.): >60
EGFR (Non-African Amer.): 60 — ABNORMAL LOW
Glucose: 927 mg/dL (ref 65–99)
Glucose: 255 mg/dL — ABNORMAL HIGH (ref 65–99)
Osmolality: 297 (ref 275–301)
Osmolality: 283 (ref 275–301)
Potassium: 3.7 mmol/L (ref 3.5–5.1)
Potassium: 4.1 mmol/L (ref 3.5–5.1)
Sodium: 123 mmol/L — ABNORMAL LOW (ref 136–145)
Sodium: 132 mmol/L — ABNORMAL LOW (ref 136–145)

## 2012-06-17 LAB — CBC
HCT: 42.1 % (ref 40.0–52.0)
HGB: 13.4 g/dL (ref 13.0–18.0)
MCH: 31 pg (ref 26.0–34.0)
MCV: 98 fL (ref 80–100)
Platelet: 377 10*3/uL (ref 150–440)
RBC: 4.32 10*6/uL — ABNORMAL LOW (ref 4.40–5.90)
WBC: 18.4 10*3/uL — ABNORMAL HIGH (ref 3.8–10.6)

## 2012-06-17 LAB — URINALYSIS, COMPLETE
Leukocyte Esterase: NEGATIVE
Nitrite: NEGATIVE
Ph: 5 (ref 4.5–8.0)
Protein: NEGATIVE
RBC,UR: NONE SEEN /HPF (ref 0–5)
WBC UR: NONE SEEN /HPF (ref 0–5)

## 2012-06-18 LAB — BASIC METABOLIC PANEL
Anion Gap: 8 (ref 7–16)
BUN: 11 mg/dL (ref 7–18)
Calcium, Total: 9 mg/dL (ref 8.5–10.1)
Chloride: 101 mmol/L (ref 98–107)
Co2: 28 mmol/L (ref 21–32)
Creatinine: 0.75 mg/dL (ref 0.60–1.30)
EGFR (Non-African Amer.): >60
Glucose: 174 mg/dL — ABNORMAL HIGH (ref 65–99)
Osmolality: 277 (ref 275–301)
Potassium: 3.3 mmol/L — ABNORMAL LOW (ref 3.5–5.1)
Sodium: 137 mmol/L (ref 136–145)

## 2012-07-04 LAB — CBC WITH DIFFERENTIAL/PLATELET
Basophil #: 0 10*3/uL (ref 0.0–0.1)
Basophil %: 0.3 %
Eosinophil %: 0.6 %
HCT: 44.3 % (ref 40.0–52.0)
HGB: 14.8 g/dL (ref 13.0–18.0)
Lymphocyte #: 1.8 10*3/uL (ref 1.0–3.6)
Lymphocyte %: 12.7 %
MCH: 32.1 pg (ref 26.0–34.0)
MCV: 96 fL (ref 80–100)
Monocyte %: 2.9 %
Neutrophil #: 12.2 10*3/uL — ABNORMAL HIGH (ref 1.4–6.5)
RBC: 4.6 10*6/uL (ref 4.40–5.90)
WBC: 14.6 10*3/uL — ABNORMAL HIGH (ref 3.8–10.6)

## 2012-07-04 LAB — BASIC METABOLIC PANEL
Creatinine: 1.05 mg/dL (ref 0.60–1.30)
EGFR (African American): >60
EGFR (Non-African Amer.): >60
Osmolality: 284 (ref 275–301)
Sodium: 129 mmol/L — ABNORMAL LOW (ref 136–145)

## 2012-07-05 ENCOUNTER — Inpatient Hospital Stay: Payer: Self-pay | Admitting: Student

## 2012-07-05 LAB — CBC WITH DIFFERENTIAL/PLATELET
Basophil %: 0.2 %
Eosinophil #: 0 10*3/uL (ref 0.0–0.7)
Eosinophil %: 0 %
HCT: 43.6 % (ref 40.0–52.0)
HGB: 14.8 g/dL (ref 13.0–18.0)
Lymphocyte #: 2.3 10*3/uL (ref 1.0–3.6)
Lymphocyte %: 11.7 %
MCH: 31.6 pg (ref 26.0–34.0)
MCHC: 34 g/dL (ref 32.0–36.0)
MCV: 93 fL (ref 80–100)
Neutrophil #: 16.6 10*3/uL — ABNORMAL HIGH (ref 1.4–6.5)
Neutrophil %: 83 %
Platelet: 452 10*3/uL — ABNORMAL HIGH (ref 150–440)
RDW: 13.2 % (ref 11.5–14.5)

## 2012-07-05 LAB — BASIC METABOLIC PANEL
BUN: 19 mg/dL — ABNORMAL HIGH (ref 7–18)
Calcium, Total: 9.4 mg/dL (ref 8.5–10.1)
Creatinine: 1.16 mg/dL (ref 0.60–1.30)
EGFR (African American): >60
EGFR (Non-African Amer.): >60
Glucose: 133 mg/dL — ABNORMAL HIGH (ref 65–99)
Osmolality: 272 (ref 275–301)
Potassium: 4.5 mmol/L (ref 3.5–5.1)
Sodium: 134 mmol/L — ABNORMAL LOW (ref 136–145)

## 2012-07-05 LAB — URINALYSIS, COMPLETE
Blood: NEGATIVE
Ph: 5 (ref 4.5–8.0)
Protein: NEGATIVE
RBC,UR: <1 /HPF (ref 0–5)
WBC UR: <1 /HPF (ref 0–5)

## 2012-07-06 LAB — COMPREHENSIVE METABOLIC PANEL
Albumin: 3.2 g/dL — ABNORMAL LOW (ref 3.4–5.0)
Alkaline Phosphatase: 121 U/L (ref 50–136)
Anion Gap: 7 (ref 7–16)
BUN: 4 mg/dL — ABNORMAL LOW (ref 7–18)
Bilirubin,Total: 0.4 mg/dL (ref 0.2–1.0)
Calcium, Total: 9 mg/dL (ref 8.5–10.1)
Creatinine: 0.89 mg/dL (ref 0.60–1.30)
Glucose: 209 mg/dL — ABNORMAL HIGH (ref 65–99)
Osmolality: 273 (ref 275–301)
Potassium: 4.1 mmol/L (ref 3.5–5.1)
SGPT (ALT): 12 U/L (ref 12–78)
Sodium: 135 mmol/L — ABNORMAL LOW (ref 136–145)
Total Protein: 7.4 g/dL (ref 6.4–8.2)

## 2012-07-06 LAB — CBC WITH DIFFERENTIAL/PLATELET
Basophil %: 0.2 %
Eosinophil #: 0.1 10*3/uL (ref 0.0–0.7)
Eosinophil %: 0.8 %
HCT: 36.2 % — ABNORMAL LOW (ref 40.0–52.0)
HGB: 12.3 g/dL — ABNORMAL LOW (ref 13.0–18.0)
Lymphocyte #: 2.5 10*3/uL (ref 1.0–3.6)
Lymphocyte %: 19.5 %
MCV: 93 fL (ref 80–100)
Monocyte %: 5.8 %
Neutrophil #: 9.5 10*3/uL — ABNORMAL HIGH (ref 1.4–6.5)
Platelet: 298 10*3/uL (ref 150–440)
RBC: 3.88 10*6/uL — ABNORMAL LOW (ref 4.40–5.90)
WBC: 12.8 10*3/uL — ABNORMAL HIGH (ref 3.8–10.6)

## 2012-07-21 ENCOUNTER — Emergency Department: Payer: Self-pay | Admitting: Emergency Medicine

## 2012-08-19 ENCOUNTER — Inpatient Hospital Stay: Payer: Self-pay | Admitting: Internal Medicine

## 2012-08-19 LAB — URINALYSIS, COMPLETE
Bacteria: NONE SEEN
Glucose,UR: >=500 mg/dL (ref 0–75)
Leukocyte Esterase: NEGATIVE
Nitrite: NEGATIVE
Ph: 5 (ref 4.5–8.0)
Protein: NEGATIVE
Specific Gravity: 1.021 (ref 1.003–1.030)
WBC UR: NONE SEEN /HPF (ref 0–5)

## 2012-08-19 LAB — COMPREHENSIVE METABOLIC PANEL
Albumin: 4.1 g/dL (ref 3.4–5.0)
Alkaline Phosphatase: 148 U/L — ABNORMAL HIGH (ref 50–136)
Anion Gap: 29 — ABNORMAL HIGH (ref 7–16)
BUN: 20 mg/dL — ABNORMAL HIGH (ref 7–18)
Bilirubin,Total: 0.6 mg/dL (ref 0.2–1.0)
Co2: 8 mmol/L — CL (ref 21–32)
EGFR (African American): >60
EGFR (Non-African Amer.): >60
Glucose: 750 mg/dL (ref 65–99)
Osmolality: 296 (ref 275–301)
Potassium: 5.3 mmol/L — ABNORMAL HIGH (ref 3.5–5.1)
SGOT(AST): 14 U/L — ABNORMAL LOW (ref 15–37)
SGPT (ALT): 13 U/L (ref 12–78)

## 2012-08-19 LAB — CBC WITH DIFFERENTIAL/PLATELET
Basophil #: 0 10*3/uL (ref 0.0–0.1)
Basophil %: 0.2 %
Eosinophil #: 0 10*3/uL (ref 0.0–0.7)
Eosinophil %: 0.1 %
Lymphocyte #: 1.3 10*3/uL (ref 1.0–3.6)
Lymphocyte %: 5.5 %
MCH: 31.5 pg (ref 26.0–34.0)
MCHC: 31.8 g/dL — ABNORMAL LOW (ref 32.0–36.0)
MCV: 99 fL (ref 80–100)
Monocyte #: 0.3 x10 3/mm (ref 0.2–1.0)
Monocyte %: 1.5 %
Neutrophil #: 21.2 10*3/uL — ABNORMAL HIGH (ref 1.4–6.5)
Neutrophil %: 92.7 %
Platelet: 391 10*3/uL (ref 150–440)
RBC: 4.55 10*6/uL (ref 4.40–5.90)
RDW: 13.1 % (ref 11.5–14.5)
WBC: 22.8 10*3/uL — ABNORMAL HIGH (ref 3.8–10.6)

## 2012-08-20 LAB — BASIC METABOLIC PANEL
Anion Gap: 22 — ABNORMAL HIGH (ref 7–16)
Anion Gap: 12 (ref 7–16)
BUN: 20 mg/dL — ABNORMAL HIGH (ref 7–18)
Calcium, Total: 9.3 mg/dL (ref 8.5–10.1)
Calcium, Total: 9.3 mg/dL (ref 8.5–10.1)
Calcium, Total: 8.7 mg/dL (ref 8.5–10.1)
Chloride: 105 mmol/L (ref 98–107)
Chloride: 110 mmol/L — ABNORMAL HIGH (ref 98–107)
Co2: 14 mmol/L — ABNORMAL LOW (ref 21–32)
Co2: 20 mmol/L — ABNORMAL LOW (ref 21–32)
EGFR (African American): >60
EGFR (Non-African Amer.): 59 — ABNORMAL LOW
EGFR (Non-African Amer.): >60
Glucose: 315 mg/dL — ABNORMAL HIGH (ref 65–99)
Glucose: 211 mg/dL — ABNORMAL HIGH (ref 65–99)
Osmolality: 296 (ref 275–301)
Osmolality: 286 (ref 275–301)
Osmolality: 281 (ref 275–301)
Potassium: 3.9 mmol/L (ref 3.5–5.1)
Potassium: 4 mmol/L (ref 3.5–5.1)

## 2012-08-20 LAB — CBC WITH DIFFERENTIAL/PLATELET
Basophil #: 0.2 10*3/uL — ABNORMAL HIGH (ref 0.0–0.1)
Eosinophil %: 0.1 %
HGB: 13.7 g/dL (ref 13.0–18.0)
Lymphocyte %: 9.8 %
MCH: 31.6 pg (ref 26.0–34.0)
MCV: 92 fL (ref 80–100)
Monocyte #: 1.8 x10 3/mm — ABNORMAL HIGH (ref 0.2–1.0)
Monocyte %: 7.1 %
Platelet: 373 10*3/uL (ref 150–440)
RBC: 4.34 10*6/uL — ABNORMAL LOW (ref 4.40–5.90)
RDW: 12.2 % (ref 11.5–14.5)
WBC: 25.8 10*3/uL — ABNORMAL HIGH (ref 3.8–10.6)

## 2012-08-21 LAB — VANCOMYCIN, TROUGH: Vancomycin, Trough: 12 ug/mL (ref 10–20)

## 2012-08-22 LAB — CBC WITH DIFFERENTIAL/PLATELET
Basophil #: 0 10*3/uL (ref 0.0–0.1)
Basophil %: 0.3 %
Eosinophil #: 0.1 10*3/uL (ref 0.0–0.7)
HCT: 37.4 % — ABNORMAL LOW (ref 40.0–52.0)
HGB: 13.2 g/dL (ref 13.0–18.0)
MCH: 32.1 pg (ref 26.0–34.0)
MCHC: 35.3 g/dL (ref 32.0–36.0)
MCV: 91 fL (ref 80–100)
Monocyte #: 0.5 x10 3/mm (ref 0.2–1.0)
Neutrophil #: 4.6 10*3/uL (ref 1.4–6.5)
Neutrophil %: 62.4 %
RDW: 12.6 % (ref 11.5–14.5)

## 2012-08-24 LAB — CULTURE, BLOOD (SINGLE)

## 2012-09-06 ENCOUNTER — Inpatient Hospital Stay: Payer: Self-pay | Admitting: Internal Medicine

## 2012-09-06 LAB — BASIC METABOLIC PANEL
BUN: 14 mg/dL (ref 7–18)
Chloride: 101 mmol/L (ref 98–107)
Creatinine: 0.96 mg/dL (ref 0.60–1.30)
Osmolality: 273 (ref 275–301)
Potassium: 4 mmol/L (ref 3.5–5.1)
Sodium: 134 mmol/L — ABNORMAL LOW (ref 136–145)

## 2012-09-06 LAB — COMPREHENSIVE METABOLIC PANEL
Albumin: 4.7 g/dL (ref 3.4–5.0)
Alkaline Phosphatase: 130 U/L (ref 50–136)
Bilirubin,Total: 0.9 mg/dL (ref 0.2–1.0)
Calcium, Total: 9.8 mg/dL (ref 8.5–10.1)
Co2: 13 mmol/L — ABNORMAL LOW (ref 21–32)
Creatinine: 1.55 mg/dL — ABNORMAL HIGH (ref 0.60–1.30)
EGFR (African American): >60
EGFR (Non-African Amer.): >60
Glucose: 839 mg/dL (ref 65–99)
Osmolality: 298 (ref 275–301)
SGPT (ALT): 15 U/L (ref 12–78)

## 2012-09-06 LAB — URINALYSIS, COMPLETE
Bacteria: NONE SEEN
Bilirubin,UR: NEGATIVE
Leukocyte Esterase: NEGATIVE
Nitrite: NEGATIVE
Protein: NEGATIVE
RBC,UR: NONE SEEN /HPF (ref 0–5)
WBC UR: NONE SEEN /HPF (ref 0–5)

## 2012-09-06 LAB — CBC
HCT: 47.2 % (ref 40.0–52.0)
HGB: 14.7 g/dL (ref 13.0–18.0)
MCH: 31.4 pg (ref 26.0–34.0)
MCHC: 31.2 g/dL — ABNORMAL LOW (ref 32.0–36.0)
MCV: 101 fL — ABNORMAL HIGH (ref 80–100)
RDW: 13.5 % (ref 11.5–14.5)

## 2012-09-06 LAB — TSH: Thyroid Stimulating Horm: 4.83 u[IU]/mL — ABNORMAL HIGH

## 2012-09-06 LAB — CK TOTAL AND CKMB (NOT AT ARMC)
CK, Total: 41 U/L (ref 35–232)
CK-MB: <0.5 ng/mL — ABNORMAL LOW (ref 0.5–3.6)

## 2012-09-06 LAB — TROPONIN I: Troponin-I: <0.02 ng/mL

## 2012-09-07 LAB — BASIC METABOLIC PANEL
BUN: 11 mg/dL (ref 7–18)
Calcium, Total: 9 mg/dL (ref 8.5–10.1)
Chloride: 99 mmol/L (ref 98–107)
EGFR (African American): >60
Glucose: 202 mg/dL — ABNORMAL HIGH (ref 65–99)
Potassium: 3.7 mmol/L (ref 3.5–5.1)
Sodium: 132 mmol/L — ABNORMAL LOW (ref 136–145)

## 2012-09-07 LAB — CBC WITH DIFFERENTIAL/PLATELET
Basophil #: 0 x10 3/mm 3
Basophil %: 0.4 %
Eosinophil #: 0.2 x10 3/mm 3
Eosinophil %: 1.7 %
HCT: 37.3 % — ABNORMAL LOW
HGB: 12.1 g/dL — ABNORMAL LOW
Lymphocyte %: 26.4 %
Lymphs Abs: 3.1 x10 3/mm 3
MCH: 30.3 pg
MCHC: 32.4 g/dL
MCV: 93 fL
Monocyte #: 0.8 "x10 3/mm "
Monocyte %: 7.1 %
Neutrophil #: 7.5 x10 3/mm 3 — ABNORMAL HIGH
Neutrophil %: 64.4 %
Platelet: 335 x10 3/mm 3
RBC: 4 x10 6/mm 3 — ABNORMAL LOW
RDW: 13.3 %
WBC: 11.6 x10 3/mm 3 — ABNORMAL HIGH

## 2012-09-08 LAB — URINE CULTURE

## 2012-09-12 LAB — CULTURE, BLOOD (SINGLE)

## 2012-10-23 ENCOUNTER — Inpatient Hospital Stay: Payer: Self-pay | Admitting: Family Medicine

## 2012-10-23 LAB — URINALYSIS, COMPLETE
Bacteria: NONE SEEN
Bilirubin,UR: NEGATIVE
Glucose,UR: >=500 mg/dL (ref 0–75)
Nitrite: NEGATIVE
RBC,UR: 1 /HPF (ref 0–5)
Specific Gravity: 1.023 (ref 1.003–1.030)
Squamous Epithelial: NONE SEEN
WBC UR: 1 /HPF (ref 0–5)

## 2012-10-23 LAB — COMPREHENSIVE METABOLIC PANEL
Bilirubin,Total: 0.8 mg/dL (ref 0.2–1.0)
Calcium, Total: 9.5 mg/dL (ref 8.5–10.1)
Chloride: 90 mmol/L — ABNORMAL LOW (ref 98–107)
Co2: 9 mmol/L — CL (ref 21–32)
Creatinine: 1.8 mg/dL — ABNORMAL HIGH (ref 0.60–1.30)
EGFR (African American): 58 — ABNORMAL LOW
Potassium: 5.1 mmol/L (ref 3.5–5.1)
SGPT (ALT): 17 U/L (ref 12–78)
Sodium: 132 mmol/L — ABNORMAL LOW (ref 136–145)
Total Protein: 9.3 g/dL — ABNORMAL HIGH (ref 6.4–8.2)

## 2012-10-23 LAB — BASIC METABOLIC PANEL
Anion Gap: 10 (ref 7–16)
Calcium, Total: 9.5 mg/dL (ref 8.5–10.1)
Chloride: 104 mmol/L (ref 98–107)
Co2: 13 mmol/L — ABNORMAL LOW (ref 21–32)
Co2: 24 mmol/L (ref 21–32)
Creatinine: 1.63 mg/dL — ABNORMAL HIGH (ref 0.60–1.30)
Creatinine: 1.6 mg/dL — ABNORMAL HIGH (ref 0.60–1.30)
EGFR (Non-African Amer.): 56 — ABNORMAL LOW
EGFR (Non-African Amer.): 58 — ABNORMAL LOW
Glucose: 524 mg/dL (ref 65–99)
Osmolality: 302 (ref 275–301)
Osmolality: 286 (ref 275–301)
Potassium: 4.1 mmol/L (ref 3.5–5.1)
Potassium: 3 mmol/L — ABNORMAL LOW (ref 3.5–5.1)
Sodium: 135 mmol/L — ABNORMAL LOW (ref 136–145)
Sodium: 138 mmol/L (ref 136–145)

## 2012-10-23 LAB — CBC WITH DIFFERENTIAL/PLATELET
Basophil #: 0.1 10*3/uL (ref 0.0–0.1)
Basophil %: 0.3 %
Eosinophil #: 0 10*3/uL (ref 0.0–0.7)
Eosinophil %: 0 %
HCT: 49.6 % (ref 40.0–52.0)
Lymphocyte #: 1.6 10*3/uL (ref 1.0–3.6)
Lymphocyte %: 6.8 %
MCH: 31.5 pg (ref 26.0–34.0)
MCHC: 31.6 g/dL — ABNORMAL LOW (ref 32.0–36.0)
MCV: 100 fL (ref 80–100)
Neutrophil #: 21.1 10*3/uL — ABNORMAL HIGH (ref 1.4–6.5)
Neutrophil %: 90.3 %
Platelet: 356 10*3/uL (ref 150–440)

## 2012-10-24 LAB — COMPREHENSIVE METABOLIC PANEL
Albumin: 3.9 g/dL (ref 3.4–5.0)
Anion Gap: 15 (ref 7–16)
BUN: 23 mg/dL — ABNORMAL HIGH (ref 7–18)
Bilirubin,Total: 0.7 mg/dL (ref 0.2–1.0)
Calcium, Total: 9.2 mg/dL (ref 8.5–10.1)
Chloride: 100 mmol/L (ref 98–107)
Creatinine: 1.32 mg/dL — ABNORMAL HIGH (ref 0.60–1.30)
EGFR (African American): >60
EGFR (Non-African Amer.): >60
Glucose: 335 mg/dL — ABNORMAL HIGH (ref 65–99)
Potassium: 3.9 mmol/L (ref 3.5–5.1)
SGOT(AST): 11 U/L — ABNORMAL LOW (ref 15–37)
SGPT (ALT): 13 U/L (ref 12–78)
Sodium: 136 mmol/L (ref 136–145)
Total Protein: 7.5 g/dL (ref 6.4–8.2)

## 2012-10-24 LAB — CBC WITH DIFFERENTIAL/PLATELET
Basophil #: 0.1 10*3/uL (ref 0.0–0.1)
Basophil %: 0.3 %
Eosinophil %: 0.1 %
HCT: 40.7 % (ref 40.0–52.0)
HGB: 13.2 g/dL (ref 13.0–18.0)
Lymphocyte %: 12 %
Monocyte #: 1.3 x10 3/mm — ABNORMAL HIGH (ref 0.2–1.0)
Monocyte %: 5.3 %
Neutrophil #: 19.7 10*3/uL — ABNORMAL HIGH (ref 1.4–6.5)
Platelet: 250 10*3/uL (ref 150–440)
RBC: 4.37 10*6/uL — ABNORMAL LOW (ref 4.40–5.90)
WBC: 24 10*3/uL — ABNORMAL HIGH (ref 3.8–10.6)

## 2012-10-24 LAB — URINE CULTURE

## 2012-10-24 LAB — MAGNESIUM: Magnesium: 1.8 mg/dL

## 2012-11-28 ENCOUNTER — Inpatient Hospital Stay: Payer: Self-pay | Admitting: Internal Medicine

## 2012-11-28 LAB — DIFFERENTIAL
Basophil #: 0.1 10*3/uL (ref 0.0–0.1)
Eosinophil #: 0 10*3/uL (ref 0.0–0.7)
Lymphocyte #: 2.7 10*3/uL (ref 1.0–3.6)
Monocyte #: 0.7 x10 3/mm (ref 0.2–1.0)
Neutrophil #: 23.5 10*3/uL — ABNORMAL HIGH (ref 1.4–6.5)

## 2012-11-28 LAB — COMPREHENSIVE METABOLIC PANEL
Albumin: 4.8 g/dL (ref 3.4–5.0)
Alkaline Phosphatase: 170 U/L — ABNORMAL HIGH (ref 50–136)
Anion Gap: 30 — ABNORMAL HIGH (ref 7–16)
BUN: 27 mg/dL — ABNORMAL HIGH (ref 7–18)
Bilirubin,Total: 0.8 mg/dL (ref 0.2–1.0)
Calcium, Total: 9.4 mg/dL (ref 8.5–10.1)
Chloride: 87 mmol/L — ABNORMAL LOW (ref 98–107)
Creatinine: 1.52 mg/dL — ABNORMAL HIGH (ref 0.60–1.30)
EGFR (African American): >60
Glucose: 695 mg/dL (ref 65–99)
Osmolality: 293 (ref 275–301)
Potassium: 4.5 mmol/L (ref 3.5–5.1)
SGOT(AST): 13 U/L — ABNORMAL LOW (ref 15–37)
SGPT (ALT): 17 U/L (ref 12–78)
Sodium: 127 mmol/L — ABNORMAL LOW (ref 136–145)

## 2012-11-28 LAB — CBC
HCT: 50.2 % (ref 40.0–52.0)
HGB: 16.4 g/dL (ref 13.0–18.0)
MCH: 31.4 pg (ref 26.0–34.0)
MCV: 96 fL (ref 80–100)
Platelet: 366 10*3/uL (ref 150–440)
RBC: 5.22 10*6/uL (ref 4.40–5.90)
RDW: 12.7 % (ref 11.5–14.5)

## 2012-11-29 LAB — CBC WITH DIFFERENTIAL/PLATELET
Basophil #: 0 10*3/uL (ref 0.0–0.1)
Eosinophil %: 0.1 %
HGB: 13.3 g/dL (ref 13.0–18.0)
Lymphocyte #: 2.7 10*3/uL (ref 1.0–3.6)
MCH: 31.2 pg (ref 26.0–34.0)
Neutrophil #: 16.4 10*3/uL — ABNORMAL HIGH (ref 1.4–6.5)
Platelet: 268 10*3/uL (ref 150–440)
RBC: 4.27 10*6/uL — ABNORMAL LOW (ref 4.40–5.90)
WBC: 20.4 10*3/uL — ABNORMAL HIGH (ref 3.8–10.6)

## 2012-11-29 LAB — BASIC METABOLIC PANEL
BUN: 15 mg/dL (ref 7–18)
Calcium, Total: 8.1 mg/dL — ABNORMAL LOW (ref 8.5–10.1)
Chloride: 103 mmol/L (ref 98–107)
Co2: 16 mmol/L — ABNORMAL LOW (ref 21–32)
Creatinine: 1.18 mg/dL (ref 0.60–1.30)
EGFR (Non-African Amer.): >60
Glucose: 98 mg/dL (ref 65–99)
Osmolality: 269 (ref 275–301)
Potassium: 3.8 mmol/L (ref 3.5–5.1)
Sodium: 134 mmol/L — ABNORMAL LOW (ref 136–145)

## 2012-11-29 LAB — MAGNESIUM: Magnesium: 1.6 mg/dL — ABNORMAL LOW

## 2012-11-29 LAB — HEMOGLOBIN A1C: Hemoglobin A1C: 11.5 % — ABNORMAL HIGH (ref 4.2–6.3)

## 2013-02-06 ENCOUNTER — Emergency Department: Payer: Self-pay | Admitting: Emergency Medicine

## 2013-02-06 LAB — CBC
HGB: 15.8 g/dL (ref 13.0–18.0)
MCH: 32.4 pg (ref 26.0–34.0)
MCHC: 33.9 g/dL (ref 32.0–36.0)
MCV: 96 fL (ref 80–100)
Platelet: 298 10*3/uL (ref 150–440)
RBC: 4.88 10*6/uL (ref 4.40–5.90)
RDW: 13.1 % (ref 11.5–14.5)

## 2013-02-06 LAB — COMPREHENSIVE METABOLIC PANEL
Albumin: 4.4 g/dL (ref 3.4–5.0)
Alkaline Phosphatase: 123 U/L (ref 50–136)
BUN: 12 mg/dL (ref 7–18)
Chloride: 99 mmol/L (ref 98–107)
Co2: 29 mmol/L (ref 21–32)
Creatinine: 0.94 mg/dL (ref 0.60–1.30)
EGFR (African American): >60
EGFR (Non-African Amer.): >60
Glucose: 399 mg/dL — ABNORMAL HIGH (ref 65–99)
Osmolality: 281 (ref 275–301)
Potassium: 5.1 mmol/L (ref 3.5–5.1)
Sodium: 132 mmol/L — ABNORMAL LOW (ref 136–145)

## 2013-05-17 ENCOUNTER — Emergency Department: Payer: Self-pay | Admitting: Emergency Medicine

## 2013-05-22 ENCOUNTER — Inpatient Hospital Stay: Payer: Self-pay | Admitting: Internal Medicine

## 2013-05-22 LAB — COMPREHENSIVE METABOLIC PANEL
Albumin: 4.7 g/dL (ref 3.4–5.0)
Alkaline Phosphatase: 158 U/L — ABNORMAL HIGH (ref 50–136)
BUN: 26 mg/dL — ABNORMAL HIGH (ref 7–18)
Bilirubin,Total: 0.6 mg/dL (ref 0.2–1.0)
Calcium, Total: 10.1 mg/dL (ref 8.5–10.1)
Chloride: 88 mmol/L — ABNORMAL LOW (ref 98–107)
Creatinine: 1.37 mg/dL — ABNORMAL HIGH (ref 0.60–1.30)
EGFR (Non-African Amer.): >60
Glucose: 691 mg/dL (ref 65–99)
Potassium: 5.4 mmol/L — ABNORMAL HIGH (ref 3.5–5.1)
SGOT(AST): 12 U/L — ABNORMAL LOW (ref 15–37)
SGPT (ALT): 14 U/L (ref 12–78)
Sodium: 127 mmol/L — ABNORMAL LOW (ref 136–145)

## 2013-05-22 LAB — CBC
HCT: 48 % (ref 40.0–52.0)
MCHC: 32.3 g/dL (ref 32.0–36.0)
MCV: 100 fL (ref 80–100)
Platelet: 470 10*3/uL — ABNORMAL HIGH (ref 150–440)
WBC: 26.2 10*3/uL — ABNORMAL HIGH (ref 3.8–10.6)

## 2013-05-22 LAB — DRUG SCREEN, URINE
Amphetamines, Ur Screen: NEGATIVE (ref ?–1000)
Barbiturates, Ur Screen: NEGATIVE (ref ?–200)
Benzodiazepine, Ur Scrn: NEGATIVE (ref ?–200)
Opiate, Ur Screen: NEGATIVE (ref ?–300)
Phencyclidine (PCP) Ur S: NEGATIVE (ref ?–25)
Tricyclic, Ur Screen: NEGATIVE (ref ?–1000)

## 2013-05-22 LAB — BASIC METABOLIC PANEL
Anion Gap: 13 (ref 7–16)
Chloride: 106 mmol/L (ref 98–107)
Co2: 16 mmol/L — ABNORMAL LOW (ref 21–32)
Creatinine: 1 mg/dL (ref 0.60–1.30)
EGFR (Non-African Amer.): >60
Glucose: 158 mg/dL — ABNORMAL HIGH (ref 65–99)
Potassium: 4.4 mmol/L (ref 3.5–5.1)

## 2013-05-22 LAB — URINALYSIS, COMPLETE
Bilirubin,UR: NEGATIVE
Leukocyte Esterase: NEGATIVE
Nitrite: NEGATIVE
Ph: 5 (ref 4.5–8.0)
Specific Gravity: 1.02 (ref 1.003–1.030)
WBC UR: <1 /HPF (ref 0–5)

## 2013-05-22 LAB — PHOSPHORUS: Phosphorus: 7.6 mg/dL — ABNORMAL HIGH (ref 2.5–4.9)

## 2013-05-22 LAB — HEMOGLOBIN A1C: Hemoglobin A1C: 11.2 % — ABNORMAL HIGH (ref 4.2–6.3)

## 2013-05-22 LAB — BETA-HYDROXYBUTYRIC ACID: Beta-Hydroxybutyrate: >46 mg/dL (ref 0.2–2.8)

## 2013-05-22 LAB — MAGNESIUM: Magnesium: 2.5 mg/dL — ABNORMAL HIGH

## 2013-05-22 LAB — LIPASE, BLOOD: Lipase: 69 U/L — ABNORMAL LOW (ref 73–393)

## 2013-05-23 LAB — CBC WITH DIFFERENTIAL/PLATELET
Basophil %: 0.1 %
Eosinophil #: 0.2 10*3/uL (ref 0.0–0.7)
HCT: 34.3 % — ABNORMAL LOW (ref 40.0–52.0)
HGB: 12.1 g/dL — ABNORMAL LOW (ref 13.0–18.0)
Lymphocyte #: 3.1 10*3/uL (ref 1.0–3.6)
Lymphocyte %: 23.3 %
MCHC: 35.4 g/dL (ref 32.0–36.0)
MCV: 93 fL (ref 80–100)
Monocyte #: 0.8 x10 3/mm (ref 0.2–1.0)
Neutrophil #: 9.1 10*3/uL — ABNORMAL HIGH (ref 1.4–6.5)
Neutrophil %: 69 %
Platelet: 298 10*3/uL (ref 150–440)
RBC: 3.69 10*6/uL — ABNORMAL LOW (ref 4.40–5.90)
RDW: 12.7 % (ref 11.5–14.5)

## 2013-05-23 LAB — BASIC METABOLIC PANEL
Anion Gap: 8 (ref 7–16)
Calcium, Total: 8.7 mg/dL (ref 8.5–10.1)
Chloride: 102 mmol/L (ref 98–107)
Co2: 24 mmol/L (ref 21–32)
EGFR (Non-African Amer.): >60
Osmolality: 270 (ref 275–301)

## 2013-06-22 ENCOUNTER — Emergency Department: Payer: Self-pay | Admitting: Emergency Medicine

## 2013-06-22 LAB — URINALYSIS, COMPLETE
Glucose,UR: >=500 mg/dL (ref 0–75)
Squamous Epithelial: NONE SEEN
WBC UR: <1 /HPF (ref 0–5)

## 2013-06-22 LAB — COMPREHENSIVE METABOLIC PANEL
Albumin: 4 g/dL (ref 3.4–5.0)
Anion Gap: 19 — ABNORMAL HIGH (ref 7–16)
BUN: 17 mg/dL (ref 7–18)
Bilirubin,Total: 1.1 mg/dL — ABNORMAL HIGH (ref 0.2–1.0)
Chloride: 92 mmol/L — ABNORMAL LOW (ref 98–107)
Co2: 15 mmol/L — ABNORMAL LOW (ref 21–32)
Creatinine: 1.05 mg/dL (ref 0.60–1.30)
EGFR (African American): >60
Glucose: 800 mg/dL (ref 65–99)
SGOT(AST): 15 U/L (ref 15–37)
SGPT (ALT): 15 U/L (ref 12–78)
Total Protein: 7.6 g/dL (ref 6.4–8.2)

## 2013-06-22 LAB — BASIC METABOLIC PANEL
Anion Gap: 15 (ref 7–16)
Co2: 15 mmol/L — ABNORMAL LOW (ref 21–32)
Creatinine: 1.05 mg/dL (ref 0.60–1.30)
EGFR (African American): >60
EGFR (Non-African Amer.): >60
Glucose: 451 mg/dL — ABNORMAL HIGH (ref 65–99)
Osmolality: 291 (ref 275–301)
Potassium: 4.2 mmol/L (ref 3.5–5.1)

## 2013-06-22 LAB — CBC
HCT: 42.3 % (ref 40.0–52.0)
HGB: 14.3 g/dL (ref 13.0–18.0)
MCHC: 33.8 g/dL (ref 32.0–36.0)
Platelet: 231 10*3/uL (ref 150–440)
WBC: 11.1 10*3/uL — ABNORMAL HIGH (ref 3.8–10.6)

## 2013-07-09 ENCOUNTER — Inpatient Hospital Stay: Payer: Self-pay | Admitting: Internal Medicine

## 2013-07-09 LAB — URINALYSIS, COMPLETE
Bacteria: NONE SEEN
Bilirubin,UR: NEGATIVE
Glucose,UR: >=500 mg/dL (ref 0–75)
Nitrite: NEGATIVE
Ph: 5 (ref 4.5–8.0)
Protein: 30
RBC,UR: 3 /HPF (ref 0–5)
Specific Gravity: 1.022 (ref 1.003–1.030)
Squamous Epithelial: <1
WBC UR: <1 /HPF (ref 0–5)

## 2013-07-09 LAB — DRUG SCREEN, URINE
Amphetamines, Ur Screen: NEGATIVE (ref ?–1000)
Cannabinoid 50 Ng, Ur ~~LOC~~: NEGATIVE (ref ?–50)
Cocaine Metabolite,Ur ~~LOC~~: NEGATIVE (ref ?–300)
Methadone, Ur Screen: NEGATIVE (ref ?–300)
Opiate, Ur Screen: NEGATIVE (ref ?–300)
Phencyclidine (PCP) Ur S: NEGATIVE (ref ?–25)
Tricyclic, Ur Screen: NEGATIVE (ref ?–1000)

## 2013-07-09 LAB — BASIC METABOLIC PANEL
Anion Gap: 9 (ref 7–16)
BUN: 17 mg/dL (ref 7–18)
Calcium, Total: 9.7 mg/dL (ref 8.5–10.1)
Calcium, Total: 9.7 mg/dL (ref 8.5–10.1)
Chloride: 102 mmol/L (ref 98–107)
Chloride: 102 mmol/L (ref 98–107)
Co2: 18 mmol/L — ABNORMAL LOW (ref 21–32)
Creatinine: 1.3 mg/dL (ref 0.60–1.30)
Creatinine: 1.32 mg/dL — ABNORMAL HIGH (ref 0.60–1.30)
EGFR (African American): >60
EGFR (Non-African Amer.): >60
EGFR (Non-African Amer.): >60
Glucose: 270 mg/dL — ABNORMAL HIGH (ref 65–99)
Glucose: 85 mg/dL (ref 65–99)
Osmolality: 277 (ref 275–301)
Osmolality: 267 (ref 275–301)
Potassium: 3.8 mmol/L (ref 3.5–5.1)
Sodium: 133 mmol/L — ABNORMAL LOW (ref 136–145)

## 2013-07-09 LAB — COMPREHENSIVE METABOLIC PANEL
Albumin: 4.2 g/dL (ref 3.4–5.0)
Alkaline Phosphatase: 149 U/L — ABNORMAL HIGH (ref 50–136)
Anion Gap: 23 — ABNORMAL HIGH (ref 7–16)
BUN: 17 mg/dL (ref 7–18)
Bilirubin,Total: 1 mg/dL (ref 0.2–1.0)
Calcium, Total: 9.7 mg/dL (ref 8.5–10.1)
Osmolality: 289 (ref 275–301)
SGOT(AST): 23 U/L (ref 15–37)
Sodium: 129 mmol/L — ABNORMAL LOW (ref 136–145)
Total Protein: 8.2 g/dL (ref 6.4–8.2)

## 2013-07-09 LAB — LIPASE, BLOOD: Lipase: 39 U/L — ABNORMAL LOW (ref 73–393)

## 2013-07-09 LAB — CBC
HCT: 46.1 % (ref 40.0–52.0)
HGB: 15 g/dL (ref 13.0–18.0)
MCH: 32.6 pg (ref 26.0–34.0)
MCHC: 32.6 g/dL (ref 32.0–36.0)
MCV: 100 fL (ref 80–100)
RDW: 12.7 % (ref 11.5–14.5)
WBC: 15.4 10*3/uL — ABNORMAL HIGH (ref 3.8–10.6)

## 2013-07-09 LAB — BETA-HYDROXYBUTYRIC ACID: Beta-Hydroxybutyrate: >46 mg/dL (ref 0.2–2.8)

## 2013-07-10 LAB — BASIC METABOLIC PANEL
Anion Gap: 8 (ref 7–16)
Co2: 24 mmol/L (ref 21–32)
EGFR (Non-African Amer.): >60
Osmolality: 273 (ref 275–301)
Potassium: 3.7 mmol/L (ref 3.5–5.1)

## 2013-07-10 LAB — CBC WITH DIFFERENTIAL/PLATELET
Basophil #: 0 10*3/uL (ref 0.0–0.1)
Basophil %: 0.2 %
HCT: 37.4 % — ABNORMAL LOW (ref 40.0–52.0)
MCH: 33.2 pg (ref 26.0–34.0)
MCHC: 34.8 g/dL (ref 32.0–36.0)
Monocyte %: 7.8 %
Neutrophil %: 60.4 %
Platelet: 266 10*3/uL (ref 150–440)
WBC: 8.5 10*3/uL (ref 3.8–10.6)

## 2013-08-06 ENCOUNTER — Emergency Department: Payer: Self-pay | Admitting: Emergency Medicine

## 2013-08-06 LAB — CBC WITH DIFFERENTIAL/PLATELET
Basophil #: 0 10*3/uL (ref 0.0–0.1)
Basophil %: 0.5 %
HCT: 43.5 % (ref 40.0–52.0)
HGB: 14.8 g/dL (ref 13.0–18.0)
MCH: 33.4 pg (ref 26.0–34.0)
Monocyte #: 0.3 x10 3/mm (ref 0.2–1.0)
Neutrophil %: 77 %
Platelet: 291 10*3/uL (ref 150–440)
RBC: 4.43 10*6/uL (ref 4.40–5.90)
WBC: 10.2 10*3/uL (ref 3.8–10.6)

## 2013-08-06 LAB — URINALYSIS, COMPLETE
Bacteria: NONE SEEN
Hyaline Cast: 2
Leukocyte Esterase: NEGATIVE
Nitrite: NEGATIVE
Ph: 5 (ref 4.5–8.0)
Squamous Epithelial: 1

## 2013-08-06 LAB — BASIC METABOLIC PANEL
Anion Gap: 23 — ABNORMAL HIGH (ref 7–16)
Calcium, Total: 9.5 mg/dL (ref 8.5–10.1)
Chloride: 93 mmol/L — ABNORMAL LOW (ref 98–107)
Co2: 19 mmol/L — ABNORMAL LOW (ref 21–32)
Creatinine: 0.98 mg/dL (ref 0.60–1.30)
Creatinine: 1.08 mg/dL (ref 0.60–1.30)
EGFR (African American): >60
EGFR (African American): >60
EGFR (Non-African Amer.): >60
Glucose: 481 mg/dL — ABNORMAL HIGH (ref 65–99)
Osmolality: 282 (ref 275–301)
Osmolality: 279 (ref 275–301)
Potassium: 4.7 mmol/L (ref 3.5–5.1)
Sodium: 133 mmol/L — ABNORMAL LOW (ref 136–145)

## 2013-08-08 ENCOUNTER — Inpatient Hospital Stay: Payer: Self-pay | Admitting: Internal Medicine

## 2013-08-08 LAB — COMPREHENSIVE METABOLIC PANEL
Albumin: 4.7 g/dL (ref 3.4–5.0)
Alkaline Phosphatase: 149 U/L — ABNORMAL HIGH (ref 50–136)
Anion Gap: 19 — ABNORMAL HIGH (ref 7–16)
BUN: 12 mg/dL (ref 7–18)
Calcium, Total: 9.7 mg/dL (ref 8.5–10.1)
Chloride: 95 mmol/L — ABNORMAL LOW (ref 98–107)
Co2: 16 mmol/L — ABNORMAL LOW (ref 21–32)
EGFR (African American): >60
EGFR (Non-African Amer.): >60
Glucose: 639 mg/dL (ref 65–99)
Potassium: 4.7 mmol/L (ref 3.5–5.1)
SGOT(AST): 12 U/L — ABNORMAL LOW (ref 15–37)
SGPT (ALT): 16 U/L (ref 12–78)
Sodium: 130 mmol/L — ABNORMAL LOW (ref 136–145)
Total Protein: 8.4 g/dL — ABNORMAL HIGH (ref 6.4–8.2)

## 2013-08-08 LAB — URINALYSIS, COMPLETE
Bilirubin,UR: NEGATIVE
Glucose,UR: >=500 mg/dL (ref 0–75)
Leukocyte Esterase: NEGATIVE
Nitrite: NEGATIVE
Ph: 6 (ref 4.5–8.0)
Protein: NEGATIVE
RBC,UR: <1 /HPF (ref 0–5)
Specific Gravity: 1.025 (ref 1.003–1.030)
Squamous Epithelial: NONE SEEN

## 2013-08-08 LAB — CBC WITH DIFFERENTIAL/PLATELET
Basophil #: 0.1 10*3/uL (ref 0.0–0.1)
Basophil %: 0.6 %
Eosinophil #: 0 10*3/uL (ref 0.0–0.7)
Eosinophil %: 0.1 %
HCT: 42.3 % (ref 40.0–52.0)
HGB: 14.1 g/dL (ref 13.0–18.0)
Lymphocyte #: 1.7 10*3/uL (ref 1.0–3.6)
Lymphocyte %: 10 %
MCH: 33.3 pg (ref 26.0–34.0)
MCV: 100 fL (ref 80–100)
Monocyte #: 1 x10 3/mm (ref 0.2–1.0)
Monocyte %: 5.7 %
Neutrophil %: 83.6 %
Platelet: 314 10*3/uL (ref 150–440)
RBC: 4.24 10*6/uL — ABNORMAL LOW (ref 4.40–5.90)
WBC: 17.2 10*3/uL — ABNORMAL HIGH (ref 3.8–10.6)

## 2013-08-09 LAB — COMPREHENSIVE METABOLIC PANEL
Albumin: 3.5 g/dL (ref 3.4–5.0)
BUN: 7 mg/dL (ref 7–18)
Bilirubin,Total: 0.4 mg/dL (ref 0.2–1.0)
Chloride: 108 mmol/L — ABNORMAL HIGH (ref 98–107)
Co2: 23 mmol/L (ref 21–32)
Creatinine: 0.95 mg/dL (ref 0.60–1.30)
EGFR (African American): >60
EGFR (Non-African Amer.): >60
Glucose: 196 mg/dL — ABNORMAL HIGH (ref 65–99)
SGOT(AST): 11 U/L — ABNORMAL LOW (ref 15–37)
SGPT (ALT): 12 U/L (ref 12–78)
Sodium: 138 mmol/L (ref 136–145)
Total Protein: 6.9 g/dL (ref 6.4–8.2)

## 2013-08-09 LAB — CBC WITH DIFFERENTIAL/PLATELET
Basophil #: 0 10*3/uL (ref 0.0–0.1)
Basophil %: 0.4 %
Eosinophil #: 0 10*3/uL (ref 0.0–0.7)
Eosinophil %: 0.4 %
HGB: 12.1 g/dL — ABNORMAL LOW (ref 13.0–18.0)
Lymphocyte #: 2.2 10*3/uL (ref 1.0–3.6)
Lymphocyte %: 19.2 %
Monocyte #: 0.6 x10 3/mm (ref 0.2–1.0)
Neutrophil #: 8.5 10*3/uL — ABNORMAL HIGH (ref 1.4–6.5)
Neutrophil %: 74.3 %
Platelet: 256 10*3/uL (ref 150–440)
RDW: 13.8 % (ref 11.5–14.5)

## 2013-08-09 LAB — HEMOGLOBIN A1C: Hemoglobin A1C: 11 % — ABNORMAL HIGH (ref 4.2–6.3)

## 2013-08-12 ENCOUNTER — Emergency Department: Payer: Self-pay

## 2013-08-12 LAB — BASIC METABOLIC PANEL
Anion Gap: 12 (ref 7–16)
BUN: 12 mg/dL (ref 7–18)
BUN: 9 mg/dL (ref 7–18)
Calcium, Total: 8.8 mg/dL (ref 8.5–10.1)
Co2: 15 mmol/L — ABNORMAL LOW (ref 21–32)
Creatinine: 1.24 mg/dL (ref 0.60–1.30)
Creatinine: 1.32 mg/dL — ABNORMAL HIGH (ref 0.60–1.30)
EGFR (African American): >60
EGFR (African American): >60
EGFR (Non-African Amer.): >60
EGFR (Non-African Amer.): >60
Glucose: 931 mg/dL (ref 65–99)
Glucose: 381 mg/dL — ABNORMAL HIGH (ref 65–99)
Osmolality: 303 (ref 275–301)
Potassium: 4.6 mmol/L (ref 3.5–5.1)
Potassium: 3.4 mmol/L — ABNORMAL LOW (ref 3.5–5.1)

## 2013-08-12 LAB — CBC
HCT: 42.7 % (ref 40.0–52.0)
HGB: 13.7 g/dL (ref 13.0–18.0)
MCH: 33.2 pg (ref 26.0–34.0)
MCHC: 32.1 g/dL (ref 32.0–36.0)
Platelet: 257 10*3/uL (ref 150–440)
RBC: 4.13 10*6/uL — ABNORMAL LOW (ref 4.40–5.90)

## 2013-08-12 LAB — URINALYSIS, COMPLETE
Bacteria: NONE SEEN
Bilirubin,UR: NEGATIVE
Blood: NEGATIVE
Glucose,UR: >=500 mg/dL (ref 0–75)
Nitrite: NEGATIVE
Ph: 6 (ref 4.5–8.0)
RBC,UR: <1 /HPF (ref 0–5)
WBC UR: <1 /HPF (ref 0–5)

## 2013-08-12 LAB — TROPONIN I
Troponin-I: <0.02 ng/mL
Troponin-I: <0.02 ng/mL

## 2013-08-13 ENCOUNTER — Inpatient Hospital Stay: Payer: Self-pay | Admitting: Internal Medicine

## 2013-08-13 LAB — CBC
HCT: 42.8 % (ref 40.0–52.0)
MCH: 33.2 pg (ref 26.0–34.0)
MCV: 101 fL — ABNORMAL HIGH (ref 80–100)
Platelet: 339 10*3/uL (ref 150–440)
WBC: 15.3 10*3/uL — ABNORMAL HIGH (ref 3.8–10.6)

## 2013-08-13 LAB — URINALYSIS, COMPLETE
Bacteria: NONE SEEN
Bilirubin,UR: NEGATIVE
Glucose,UR: >=500 mg/dL (ref 0–75)
Nitrite: NEGATIVE
Ph: 6 (ref 4.5–8.0)
RBC,UR: NONE SEEN /HPF (ref 0–5)
Specific Gravity: 1.021 (ref 1.003–1.030)
Squamous Epithelial: NONE SEEN

## 2013-08-13 LAB — COMPREHENSIVE METABOLIC PANEL
Albumin: 4 g/dL (ref 3.4–5.0)
Alkaline Phosphatase: 132 U/L (ref 50–136)
Anion Gap: 24 — ABNORMAL HIGH (ref 7–16)
BUN: 12 mg/dL (ref 7–18)
Bilirubin,Total: 0.6 mg/dL (ref 0.2–1.0)
Chloride: 97 mmol/L — ABNORMAL LOW (ref 98–107)
Co2: 11 mmol/L — ABNORMAL LOW (ref 21–32)
EGFR (Non-African Amer.): >60
Glucose: 547 mg/dL (ref 65–99)
Osmolality: 289 (ref 275–301)
Sodium: 132 mmol/L — ABNORMAL LOW (ref 136–145)
Total Protein: 7.4 g/dL (ref 6.4–8.2)

## 2013-08-13 LAB — DRUG SCREEN, URINE
Amphetamines, Ur Screen: NEGATIVE (ref ?–1000)
Barbiturates, Ur Screen: NEGATIVE (ref ?–200)
Cannabinoid 50 Ng, Ur ~~LOC~~: NEGATIVE (ref ?–50)
MDMA (Ecstasy)Ur Screen: NEGATIVE (ref ?–500)
Methadone, Ur Screen: NEGATIVE (ref ?–300)
Opiate, Ur Screen: NEGATIVE (ref ?–300)
Phencyclidine (PCP) Ur S: NEGATIVE (ref ?–25)

## 2013-08-13 LAB — CULTURE, BLOOD (SINGLE)

## 2013-08-13 LAB — BASIC METABOLIC PANEL
BUN: 9 mg/dL (ref 7–18)
Co2: 16 mmol/L — ABNORMAL LOW (ref 21–32)
EGFR (Non-African Amer.): >60
Osmolality: 277 (ref 275–301)

## 2013-08-13 LAB — CLOSTRIDIUM DIFFICILE BY PCR

## 2013-08-13 LAB — OCCULT BLOOD X 1 CARD TO LAB, STOOL: Occult Blood, Feces: NEGATIVE

## 2013-08-13 LAB — TSH: Thyroid Stimulating Horm: 2.02 u[IU]/mL

## 2013-08-14 LAB — LIPID PANEL
Cholesterol: 150 mg/dL (ref 0–200)
HDL Cholesterol: 33 mg/dL — ABNORMAL LOW (ref 40–60)
Ldl Cholesterol, Calc: 73 mg/dL (ref 0–100)
Triglycerides: 221 mg/dL — ABNORMAL HIGH (ref 0–200)
VLDL Cholesterol, Calc: 44 mg/dL — ABNORMAL HIGH (ref 5–40)

## 2013-08-14 LAB — COMPREHENSIVE METABOLIC PANEL
Albumin: 3 g/dL — ABNORMAL LOW (ref 3.4–5.0)
Anion Gap: 6 — ABNORMAL LOW (ref 7–16)
Creatinine: 1.1 mg/dL (ref 0.60–1.30)
EGFR (Non-African Amer.): >60
Glucose: 163 mg/dL — ABNORMAL HIGH (ref 65–99)
Osmolality: 274 (ref 275–301)
Potassium: 3.2 mmol/L — ABNORMAL LOW (ref 3.5–5.1)
SGOT(AST): 13 U/L — ABNORMAL LOW (ref 15–37)
Sodium: 136 mmol/L (ref 136–145)
Total Protein: 5.9 g/dL — ABNORMAL LOW (ref 6.4–8.2)

## 2013-08-14 LAB — CBC WITH DIFFERENTIAL/PLATELET
Basophil %: 0.2 %
Eosinophil #: 0.2 10*3/uL (ref 0.0–0.7)
Eosinophil %: 1.9 %
HCT: 37.6 % — ABNORMAL LOW (ref 40.0–52.0)
HGB: 13 g/dL (ref 13.0–18.0)
Lymphocyte #: 3.7 10*3/uL — ABNORMAL HIGH (ref 1.0–3.6)
Lymphocyte %: 33.9 %
MCHC: 34.5 g/dL (ref 32.0–36.0)
MCV: 97 fL (ref 80–100)
Monocyte #: 0.6 x10 3/mm (ref 0.2–1.0)
Monocyte %: 5.5 %
Neutrophil #: 6.4 10*3/uL (ref 1.4–6.5)
Neutrophil %: 58.5 %
Platelet: 263 10*3/uL (ref 150–440)
RBC: 3.9 10*6/uL — ABNORMAL LOW (ref 4.40–5.90)
RDW: 13.5 % (ref 11.5–14.5)
WBC: 11 10*3/uL — ABNORMAL HIGH (ref 3.8–10.6)

## 2013-08-14 LAB — BASIC METABOLIC PANEL
BUN: 6 mg/dL — ABNORMAL LOW (ref 7–18)
Chloride: 108 mmol/L — ABNORMAL HIGH (ref 98–107)
Potassium: 3.4 mmol/L — ABNORMAL LOW (ref 3.5–5.1)
Sodium: 138 mmol/L (ref 136–145)

## 2013-08-15 LAB — STOOL CULTURE

## 2013-08-16 LAB — BASIC METABOLIC PANEL
Anion Gap: 6 — ABNORMAL LOW (ref 7–16)
BUN: 9 mg/dL (ref 7–18)
Calcium, Total: 8.9 mg/dL (ref 8.5–10.1)
Chloride: 106 mmol/L (ref 98–107)
Co2: 31 mmol/L (ref 21–32)
Creatinine: 0.64 mg/dL (ref 0.60–1.30)
EGFR (African American): >60
EGFR (Non-African Amer.): >60
Glucose: 58 mg/dL — ABNORMAL LOW (ref 65–99)
Osmolality: 281 (ref 275–301)
Potassium: 3.2 mmol/L — ABNORMAL LOW (ref 3.5–5.1)
Sodium: 143 mmol/L (ref 136–145)

## 2013-08-30 ENCOUNTER — Emergency Department: Payer: Self-pay | Admitting: Emergency Medicine

## 2013-08-30 LAB — COMPREHENSIVE METABOLIC PANEL
Albumin: 4.3 g/dL (ref 3.4–5.0)
Alkaline Phosphatase: 135 U/L (ref 50–136)
BUN: 20 mg/dL — ABNORMAL HIGH (ref 7–18)
Bilirubin,Total: 0.7 mg/dL (ref 0.2–1.0)
Chloride: 91 mmol/L — ABNORMAL LOW (ref 98–107)
Co2: 19 mmol/L — ABNORMAL LOW (ref 21–32)
EGFR (Non-African Amer.): >60
Potassium: 5.1 mmol/L (ref 3.5–5.1)
SGOT(AST): 17 U/L (ref 15–37)
Sodium: 122 mmol/L — ABNORMAL LOW (ref 136–145)

## 2013-08-30 LAB — CBC
HGB: 14.5 g/dL (ref 13.0–18.0)
MCH: 33.2 pg (ref 26.0–34.0)
MCHC: 32.3 g/dL (ref 32.0–36.0)
WBC: 8.9 10*3/uL (ref 3.8–10.6)

## 2013-08-30 LAB — URINALYSIS, COMPLETE
Bacteria: NONE SEEN
Bilirubin,UR: NEGATIVE
Leukocyte Esterase: NEGATIVE
Nitrite: NEGATIVE
WBC UR: NONE SEEN /HPF (ref 0–5)

## 2013-08-31 ENCOUNTER — Inpatient Hospital Stay: Payer: Self-pay | Admitting: Internal Medicine

## 2013-08-31 LAB — DRUG SCREEN, URINE
Barbiturates, Ur Screen: NEGATIVE (ref ?–200)
Cannabinoid 50 Ng, Ur ~~LOC~~: NEGATIVE (ref ?–50)
Opiate, Ur Screen: NEGATIVE (ref ?–300)
Phencyclidine (PCP) Ur S: NEGATIVE (ref ?–25)
Tricyclic, Ur Screen: NEGATIVE (ref ?–1000)

## 2013-08-31 LAB — URINALYSIS, COMPLETE
Bacteria: NONE SEEN
Glucose,UR: >=500 mg/dL (ref 0–75)
Hyaline Cast: 2
Leukocyte Esterase: NEGATIVE
Nitrite: NEGATIVE
Ph: 5 (ref 4.5–8.0)
Protein: 30
RBC,UR: 19 /HPF (ref 0–5)
Squamous Epithelial: <1
WBC UR: 1 /HPF (ref 0–5)

## 2013-08-31 LAB — BASIC METABOLIC PANEL
Anion Gap: 20 — ABNORMAL HIGH (ref 7–16)
BUN: 21 mg/dL — ABNORMAL HIGH (ref 7–18)
BUN: 17 mg/dL (ref 7–18)
Calcium, Total: 8.8 mg/dL (ref 8.5–10.1)
Chloride: 110 mmol/L — ABNORMAL HIGH (ref 98–107)
Chloride: 109 mmol/L — ABNORMAL HIGH (ref 98–107)
Creatinine: 1.5 mg/dL — ABNORMAL HIGH (ref 0.60–1.30)
EGFR (African American): >60
EGFR (African American): >60
EGFR (Non-African Amer.): >60
EGFR (Non-African Amer.): >60
Glucose: 252 mg/dL — ABNORMAL HIGH (ref 65–99)
Glucose: 131 mg/dL — ABNORMAL HIGH (ref 65–99)
Osmolality: 291 (ref 275–301)
Osmolality: 279 (ref 275–301)
Potassium: 3.9 mmol/L (ref 3.5–5.1)
Potassium: 3.9 mmol/L (ref 3.5–5.1)
Sodium: 140 mmol/L (ref 136–145)

## 2013-08-31 LAB — CBC
HGB: 15.1 g/dL (ref 13.0–18.0)
MCHC: 32.1 g/dL (ref 32.0–36.0)
MCV: 104 fL — ABNORMAL HIGH (ref 80–100)
RDW: 13.6 % (ref 11.5–14.5)
WBC: 25.4 10*3/uL — ABNORMAL HIGH (ref 3.8–10.6)

## 2013-08-31 LAB — COMPREHENSIVE METABOLIC PANEL
Alkaline Phosphatase: 154 U/L — ABNORMAL HIGH (ref 50–136)
BUN: 23 mg/dL — ABNORMAL HIGH (ref 7–18)
Bilirubin,Total: 0.5 mg/dL (ref 0.2–1.0)
Calcium, Total: 9.5 mg/dL (ref 8.5–10.1)
Chloride: 94 mmol/L — ABNORMAL LOW (ref 98–107)
Creatinine: 1.73 mg/dL — ABNORMAL HIGH (ref 0.60–1.30)
Glucose: 680 mg/dL (ref 65–99)
Osmolality: 297 (ref 275–301)
SGOT(AST): 14 U/L — ABNORMAL LOW (ref 15–37)
SGPT (ALT): 15 U/L (ref 12–78)
Sodium: 130 mmol/L — ABNORMAL LOW (ref 136–145)
Total Protein: 8.2 g/dL (ref 6.4–8.2)

## 2013-09-01 LAB — BASIC METABOLIC PANEL
Anion Gap: 9 (ref 7–16)
BUN: 14 mg/dL (ref 7–18)
BUN: 12 mg/dL (ref 7–18)
Calcium, Total: 8.8 mg/dL (ref 8.5–10.1)
Chloride: 108 mmol/L — ABNORMAL HIGH (ref 98–107)
Chloride: 106 mmol/L (ref 98–107)
Creatinine: 1.14 mg/dL (ref 0.60–1.30)
Creatinine: 1.17 mg/dL (ref 0.60–1.30)
EGFR (African American): >60
EGFR (Non-African Amer.): >60
EGFR (Non-African Amer.): >60
Glucose: 134 mg/dL — ABNORMAL HIGH (ref 65–99)
Glucose: 162 mg/dL — ABNORMAL HIGH (ref 65–99)
Osmolality: 276 (ref 275–301)
Potassium: 3.9 mmol/L (ref 3.5–5.1)
Potassium: 3.9 mmol/L (ref 3.5–5.1)
Sodium: 135 mmol/L — ABNORMAL LOW (ref 136–145)

## 2013-09-01 LAB — CBC WITH DIFFERENTIAL/PLATELET
Eosinophil %: 0.4 %
HCT: 41.3 % (ref 40.0–52.0)
HGB: 14 g/dL (ref 13.0–18.0)
MCH: 33.4 pg (ref 26.0–34.0)
Monocyte #: 1 x10 3/mm (ref 0.2–1.0)
Monocyte %: 6.3 %
Neutrophil #: 11.6 10*3/uL — ABNORMAL HIGH (ref 1.4–6.5)
Neutrophil %: 71.2 %
RBC: 4.19 10*6/uL — ABNORMAL LOW (ref 4.40–5.90)

## 2013-09-15 ENCOUNTER — Inpatient Hospital Stay: Payer: Self-pay | Admitting: Internal Medicine

## 2013-09-15 LAB — URINALYSIS, COMPLETE
Bacteria: NONE SEEN
Bilirubin,UR: NEGATIVE
Ketone: NEGATIVE
Leukocyte Esterase: NEGATIVE
Nitrite: NEGATIVE
Ph: 7 (ref 4.5–8.0)
WBC UR: <1 /HPF (ref 0–5)

## 2013-09-15 LAB — COMPREHENSIVE METABOLIC PANEL
Albumin: 4.2 g/dL (ref 3.4–5.0)
Anion Gap: 10 (ref 7–16)
Bilirubin,Total: 0.4 mg/dL (ref 0.2–1.0)
Calcium, Total: 9.4 mg/dL (ref 8.5–10.1)
Chloride: 90 mmol/L — ABNORMAL LOW (ref 98–107)
EGFR (African American): >60
EGFR (Non-African Amer.): >60
Osmolality: 312 (ref 275–301)
Potassium: 5.5 mmol/L — ABNORMAL HIGH (ref 3.5–5.1)
Sodium: 124 mmol/L — ABNORMAL LOW (ref 136–145)
Total Protein: 7.5 g/dL (ref 6.4–8.2)

## 2013-09-15 LAB — CBC
HGB: 13.4 g/dL (ref 13.0–18.0)
MCH: 33.4 pg (ref 26.0–34.0)
MCHC: 31.1 g/dL — ABNORMAL LOW (ref 32.0–36.0)
MCV: 107 fL — ABNORMAL HIGH (ref 80–100)
Platelet: 252 10*3/uL (ref 150–440)
WBC: 5.8 10*3/uL (ref 3.8–10.6)

## 2013-09-15 LAB — BASIC METABOLIC PANEL
Anion Gap: 7 (ref 7–16)
Chloride: 105 mmol/L (ref 98–107)
Co2: 28 mmol/L (ref 21–32)
Creatinine: 0.71 mg/dL (ref 0.60–1.30)
EGFR (African American): >60
Osmolality: 282 (ref 275–301)
Sodium: 140 mmol/L (ref 136–145)

## 2013-09-16 LAB — BASIC METABOLIC PANEL
Glucose: 178 mg/dL — ABNORMAL HIGH (ref 65–99)
Osmolality: 273 (ref 275–301)
Potassium: 3.7 mmol/L (ref 3.5–5.1)
Sodium: 135 mmol/L — ABNORMAL LOW (ref 136–145)

## 2013-10-08 ENCOUNTER — Inpatient Hospital Stay: Payer: Self-pay | Admitting: Internal Medicine

## 2013-10-08 LAB — BASIC METABOLIC PANEL
Anion Gap: 9 (ref 7–16)
BUN: 12 mg/dL (ref 7–18)
Calcium, Total: 8.8 mg/dL (ref 8.5–10.1)
Chloride: 108 mmol/L — ABNORMAL HIGH (ref 98–107)
Co2: 20 mmol/L — ABNORMAL LOW (ref 21–32)
EGFR (Non-African Amer.): >60
Osmolality: 277 (ref 275–301)
Potassium: 3.7 mmol/L (ref 3.5–5.1)

## 2013-10-08 LAB — CBC
HCT: 48.4 % (ref 40.0–52.0)
HGB: 15.5 g/dL (ref 13.0–18.0)
MCHC: 32.1 g/dL (ref 32.0–36.0)
MCV: 100 fL (ref 80–100)
Platelet: 505 10*3/uL — ABNORMAL HIGH (ref 150–440)

## 2013-10-08 LAB — COMPREHENSIVE METABOLIC PANEL
Albumin: 4.3 g/dL (ref 3.4–5.0)
BUN: 17 mg/dL (ref 7–18)
Creatinine: 1.25 mg/dL (ref 0.60–1.30)
EGFR (African American): >60
EGFR (Non-African Amer.): >60
Glucose: 435 mg/dL — ABNORMAL HIGH (ref 65–99)
Potassium: 4.5 mmol/L (ref 3.5–5.1)
SGPT (ALT): 18 U/L (ref 12–78)
Sodium: 132 mmol/L — ABNORMAL LOW (ref 136–145)

## 2013-10-08 LAB — URINALYSIS, COMPLETE
Leukocyte Esterase: NEGATIVE
Nitrite: NEGATIVE
RBC,UR: 148 /HPF (ref 0–5)
Specific Gravity: 1.018 (ref 1.003–1.030)
Squamous Epithelial: NONE SEEN

## 2013-10-08 LAB — BETA-HYDROXYBUTYRIC ACID: Beta-Hydroxybutyrate: >46 mg/dL (ref 0.2–2.8)

## 2013-10-08 LAB — LIPASE, BLOOD: Lipase: 51 U/L — ABNORMAL LOW (ref 73–393)

## 2013-10-08 LAB — HEMOGLOBIN A1C: Hemoglobin A1C: 9.7 % — ABNORMAL HIGH (ref 4.2–6.3)

## 2013-10-08 LAB — TROPONIN I: Troponin-I: <0.02 ng/mL

## 2013-10-09 LAB — COMPREHENSIVE METABOLIC PANEL
Albumin: 3.2 g/dL — ABNORMAL LOW (ref 3.4–5.0)
Alkaline Phosphatase: 97 U/L
Bilirubin,Total: 0.5 mg/dL (ref 0.2–1.0)
Calcium, Total: 8.7 mg/dL (ref 8.5–10.1)
Creatinine: 0.95 mg/dL (ref 0.60–1.30)
EGFR (Non-African Amer.): >60
Glucose: 112 mg/dL — ABNORMAL HIGH (ref 65–99)
Osmolality: 270 (ref 275–301)
SGOT(AST): 15 U/L (ref 15–37)
SGPT (ALT): 12 U/L (ref 12–78)
Sodium: 135 mmol/L — ABNORMAL LOW (ref 136–145)

## 2013-10-09 LAB — TSH: Thyroid Stimulating Horm: 1.17 u[IU]/mL

## 2013-10-09 LAB — CBC WITH DIFFERENTIAL/PLATELET
Basophil #: 0 10*3/uL (ref 0.0–0.1)
Basophil %: 0.3 %
Eosinophil %: 1.5 %
HCT: 37.5 % — ABNORMAL LOW (ref 40.0–52.0)
Lymphocyte #: 3 10*3/uL (ref 1.0–3.6)
Lymphocyte %: 28.2 %
MCHC: 34 g/dL (ref 32.0–36.0)
Monocyte #: 0.9 x10 3/mm (ref 0.2–1.0)
Monocyte %: 8.3 %
Neutrophil #: 6.6 10*3/uL — ABNORMAL HIGH (ref 1.4–6.5)
Neutrophil %: 61.7 %
WBC: 10.7 10*3/uL — ABNORMAL HIGH (ref 3.8–10.6)

## 2013-10-12 ENCOUNTER — Inpatient Hospital Stay: Payer: Self-pay | Admitting: Internal Medicine

## 2013-10-12 LAB — CBC WITH DIFFERENTIAL/PLATELET
Basophil %: 0.3 %
Eosinophil #: 0 10*3/uL (ref 0.0–0.7)
Eosinophil %: 0 %
HCT: 46.9 % (ref 40.0–52.0)
Lymphocyte #: 1.1 10*3/uL (ref 1.0–3.6)
MCH: 32 pg (ref 26.0–34.0)
MCV: 103 fL — ABNORMAL HIGH (ref 80–100)
Monocyte #: 0.5 x10 3/mm (ref 0.2–1.0)
Monocyte %: 3 %
Neutrophil %: 90.7 %
Platelet: 399 10*3/uL (ref 150–440)
RBC: 4.54 10*6/uL (ref 4.40–5.90)
RDW: 13.5 % (ref 11.5–14.5)
WBC: 17.7 10*3/uL — ABNORMAL HIGH (ref 3.8–10.6)

## 2013-10-12 LAB — COMPREHENSIVE METABOLIC PANEL
Anion Gap: 29 — ABNORMAL HIGH (ref 7–16)
BUN: 24 mg/dL — ABNORMAL HIGH (ref 7–18)
Bilirubin,Total: 0.6 mg/dL (ref 0.2–1.0)
Calcium, Total: 9.6 mg/dL (ref 8.5–10.1)
Chloride: 94 mmol/L — ABNORMAL LOW (ref 98–107)
Co2: 7 mmol/L — CL (ref 21–32)
EGFR (African American): >60
Osmolality: 299 (ref 275–301)
Potassium: 5.6 mmol/L — ABNORMAL HIGH (ref 3.5–5.1)

## 2013-10-12 LAB — URINALYSIS, COMPLETE
Bilirubin,UR: NEGATIVE
Glucose,UR: >=500 mg/dL (ref 0–75)
Nitrite: NEGATIVE
Ph: 5 (ref 4.5–8.0)
RBC,UR: 1 /HPF (ref 0–5)
Squamous Epithelial: NONE SEEN
WBC UR: 2 /HPF (ref 0–5)

## 2013-10-12 LAB — BASIC METABOLIC PANEL
Anion Gap: 20 — ABNORMAL HIGH (ref 7–16)
BUN: 26 mg/dL — ABNORMAL HIGH (ref 7–18)
Co2: 15 mmol/L — ABNORMAL LOW (ref 21–32)
EGFR (African American): >60
EGFR (Non-African Amer.): 53 — ABNORMAL LOW
Glucose: 337 mg/dL — ABNORMAL HIGH (ref 65–99)
Potassium: 4.5 mmol/L (ref 3.5–5.1)
Sodium: 139 mmol/L (ref 136–145)

## 2013-10-13 LAB — BASIC METABOLIC PANEL
Anion Gap: 7 (ref 7–16)
BUN: 18 mg/dL (ref 7–18)
Chloride: 107 mmol/L (ref 98–107)
Co2: 23 mmol/L (ref 21–32)
Creatinine: 1.42 mg/dL — ABNORMAL HIGH (ref 0.60–1.30)
EGFR (African American): >60
Glucose: 143 mg/dL — ABNORMAL HIGH (ref 65–99)
Osmolality: 278 (ref 275–301)
Potassium: 3.8 mmol/L (ref 3.5–5.1)

## 2013-10-13 LAB — CBC WITH DIFFERENTIAL/PLATELET
Basophil #: 0 10*3/uL (ref 0.0–0.1)
Basophil #: 0.1 10*3/uL (ref 0.0–0.1)
Eosinophil #: 0 10*3/uL (ref 0.0–0.7)
Eosinophil #: 0.1 10*3/uL (ref 0.0–0.7)
Eosinophil %: 0 %
Eosinophil %: 0.9 %
HCT: 40.1 % (ref 40.0–52.0)
HGB: 13.3 g/dL (ref 13.0–18.0)
Lymphocyte #: 1.9 10*3/uL (ref 1.0–3.6)
Lymphocyte #: 1.9 10*3/uL (ref 1.0–3.6)
Lymphocyte %: 14.8 %
MCH: 31.8 pg (ref 26.0–34.0)
MCHC: 33.2 g/dL (ref 32.0–36.0)
MCHC: 32.9 g/dL (ref 32.0–36.0)
Monocyte #: 1.3 x10 3/mm — ABNORMAL HIGH (ref 0.2–1.0)
Monocyte #: 0.8 x10 3/mm (ref 0.2–1.0)
Monocyte %: 7.9 %
Monocyte %: 6.2 %
Neutrophil #: 12.8 10*3/uL — ABNORMAL HIGH (ref 1.4–6.5)
Neutrophil %: 77.7 %
Platelet: 335 10*3/uL (ref 150–440)
Platelet: 244 10*3/uL (ref 150–440)
RBC: 4.16 10*6/uL — ABNORMAL LOW (ref 4.40–5.90)
RDW: 12.8 % (ref 11.5–14.5)
WBC: 12.7 10*3/uL — ABNORMAL HIGH (ref 3.8–10.6)

## 2013-10-13 LAB — CREATININE, SERUM
Creatinine: 1.1 mg/dL (ref 0.60–1.30)
EGFR (African American): >60
EGFR (Non-African Amer.): >60

## 2013-10-13 LAB — HEMOGLOBIN A1C: Hemoglobin A1C: 9.4 % — ABNORMAL HIGH (ref 4.2–6.3)

## 2013-10-14 ENCOUNTER — Emergency Department: Payer: Self-pay | Admitting: Emergency Medicine

## 2013-10-14 LAB — COMPREHENSIVE METABOLIC PANEL
Alkaline Phosphatase: 109 U/L
Anion Gap: 8 (ref 7–16)
BUN: 6 mg/dL — ABNORMAL LOW (ref 7–18)
Bilirubin,Total: 0.6 mg/dL (ref 0.2–1.0)
Calcium, Total: 9 mg/dL (ref 8.5–10.1)
Chloride: 99 mmol/L (ref 98–107)
Co2: 27 mmol/L (ref 21–32)
EGFR (Non-African Amer.): >60
Glucose: 336 mg/dL — ABNORMAL HIGH (ref 65–99)
Osmolality: 279 (ref 275–301)
Potassium: 3.6 mmol/L (ref 3.5–5.1)
SGOT(AST): 21 U/L (ref 15–37)
SGPT (ALT): 16 U/L (ref 12–78)

## 2013-10-14 LAB — SALICYLATE LEVEL: Salicylates, Serum: <1.7 mg/dL

## 2013-10-14 LAB — CBC
HCT: 40.4 % (ref 40.0–52.0)
MCH: 30.5 pg (ref 26.0–34.0)
MCV: 96 fL (ref 80–100)
Platelet: 216 10*3/uL (ref 150–440)
RDW: 12.7 % (ref 11.5–14.5)
WBC: 6.5 10*3/uL (ref 3.8–10.6)

## 2013-10-14 LAB — ETHANOL: Ethanol %: <0.003 % (ref 0.000–0.080)

## 2013-10-14 LAB — TSH: Thyroid Stimulating Horm: 3.03 u[IU]/mL

## 2013-10-15 LAB — DRUG SCREEN, URINE
Amphetamines, Ur Screen: NEGATIVE (ref ?–1000)
Barbiturates, Ur Screen: NEGATIVE (ref ?–200)
Benzodiazepine, Ur Scrn: NEGATIVE (ref ?–200)
Methadone, Ur Screen: POSITIVE (ref ?–300)
Opiate, Ur Screen: NEGATIVE (ref ?–300)
Phencyclidine (PCP) Ur S: NEGATIVE (ref ?–25)
Tricyclic, Ur Screen: NEGATIVE (ref ?–1000)

## 2013-10-15 LAB — URINALYSIS, COMPLETE
Bilirubin,UR: NEGATIVE
Blood: NEGATIVE
Glucose,UR: >=500 mg/dL (ref 0–75)
Leukocyte Esterase: NEGATIVE
Protein: NEGATIVE
RBC,UR: <1 /HPF (ref 0–5)
Squamous Epithelial: NONE SEEN

## 2013-11-26 ENCOUNTER — Inpatient Hospital Stay: Payer: Self-pay | Admitting: Internal Medicine

## 2013-11-26 LAB — BASIC METABOLIC PANEL
ANION GAP: 8 (ref 7–16)
Anion Gap: 4 — ABNORMAL LOW (ref 7–16)
BUN: 17 mg/dL (ref 7–18)
BUN: 14 mg/dL (ref 7–18)
CALCIUM: 8.2 mg/dL — AB (ref 8.5–10.1)
CALCIUM: 8.5 mg/dL (ref 8.5–10.1)
CHLORIDE: 103 mmol/L (ref 98–107)
CHLORIDE: 106 mmol/L (ref 98–107)
CO2: 21 mmol/L (ref 21–32)
CREATININE: 1 mg/dL (ref 0.60–1.30)
Co2: 24 mmol/L (ref 21–32)
Creatinine: 1.36 mg/dL — ABNORMAL HIGH (ref 0.60–1.30)
EGFR (African American): >60
EGFR (Non-African Amer.): >60
EGFR (Non-African Amer.): >60
GLUCOSE: 201 mg/dL — AB (ref 65–99)
Glucose: 76 mg/dL (ref 65–99)
Osmolality: 272 (ref 275–301)
Osmolality: 267 (ref 275–301)
Potassium: 3.6 mmol/L (ref 3.5–5.1)
Potassium: 4.1 mmol/L (ref 3.5–5.1)
Sodium: 132 mmol/L — ABNORMAL LOW (ref 136–145)
Sodium: 134 mmol/L — ABNORMAL LOW (ref 136–145)

## 2013-11-26 LAB — COMPREHENSIVE METABOLIC PANEL
Albumin: 5.1 g/dL — ABNORMAL HIGH (ref 3.4–5.0)
Alkaline Phosphatase: 154 U/L — ABNORMAL HIGH
Anion Gap: 25 — ABNORMAL HIGH (ref 7–16)
BUN: 26 mg/dL — AB (ref 7–18)
Bilirubin,Total: 0.6 mg/dL (ref 0.2–1.0)
CHLORIDE: 91 mmol/L — AB (ref 98–107)
CO2: 13 mmol/L — AB (ref 21–32)
Calcium, Total: 10 mg/dL (ref 8.5–10.1)
Creatinine: 1.91 mg/dL — ABNORMAL HIGH (ref 0.60–1.30)
GFR CALC AF AMER: 54 — AB
GFR CALC NON AF AMER: 46 — AB
Glucose: 789 mg/dL (ref 65–99)
Osmolality: 302 (ref 275–301)
POTASSIUM: 4.9 mmol/L (ref 3.5–5.1)
SGOT(AST): 16 U/L (ref 15–37)
SGPT (ALT): 12 U/L (ref 12–78)
Sodium: 129 mmol/L — ABNORMAL LOW (ref 136–145)
Total Protein: 9.3 g/dL — ABNORMAL HIGH (ref 6.4–8.2)

## 2013-11-26 LAB — CBC
HCT: 47.4 % (ref 40.0–52.0)
HGB: 15.2 g/dL (ref 13.0–18.0)
MCH: 32.6 pg (ref 26.0–34.0)
MCHC: 32.1 g/dL (ref 32.0–36.0)
MCV: 102 fL — ABNORMAL HIGH (ref 80–100)
Platelet: 398 10*3/uL (ref 150–440)
RBC: 4.67 10*6/uL (ref 4.40–5.90)
RDW: 13.5 % (ref 11.5–14.5)
WBC: 25.2 10*3/uL — ABNORMAL HIGH (ref 3.8–10.6)

## 2013-11-26 LAB — URINALYSIS, COMPLETE
BACTERIA: NONE SEEN
Bilirubin,UR: NEGATIVE
Leukocyte Esterase: NEGATIVE
NITRITE: NEGATIVE
PH: 5 (ref 4.5–8.0)
Protein: 30
RBC,UR: 1 /HPF (ref 0–5)
SPECIFIC GRAVITY: 1.02 (ref 1.003–1.030)
Squamous Epithelial: NONE SEEN
WBC UR: 1 /HPF (ref 0–5)

## 2013-11-27 LAB — BASIC METABOLIC PANEL
Anion Gap: 9 (ref 7–16)
Anion Gap: 9 (ref 7–16)
BUN: 12 mg/dL (ref 7–18)
BUN: 10 mg/dL (ref 7–18)
CHLORIDE: 98 mmol/L (ref 98–107)
CO2: 20 mmol/L — AB (ref 21–32)
Calcium, Total: 8.5 mg/dL (ref 8.5–10.1)
Calcium, Total: 8.8 mg/dL (ref 8.5–10.1)
Chloride: 98 mmol/L (ref 98–107)
Co2: 22 mmol/L (ref 21–32)
Creatinine: 1.29 mg/dL (ref 0.60–1.30)
Creatinine: 1.19 mg/dL (ref 0.60–1.30)
EGFR (African American): >60
GLUCOSE: 244 mg/dL — AB (ref 65–99)
Glucose: 386 mg/dL — ABNORMAL HIGH (ref 65–99)
OSMOLALITY: 271 (ref 275–301)
Osmolality: 266 (ref 275–301)
POTASSIUM: 4.8 mmol/L (ref 3.5–5.1)
POTASSIUM: 3.6 mmol/L (ref 3.5–5.1)
Sodium: 127 mmol/L — ABNORMAL LOW (ref 136–145)
Sodium: 129 mmol/L — ABNORMAL LOW (ref 136–145)

## 2013-11-27 LAB — CBC WITH DIFFERENTIAL/PLATELET
BASOS ABS: 0 10*3/uL (ref 0.0–0.1)
Basophil %: 0.2 %
EOS ABS: 0.1 10*3/uL (ref 0.0–0.7)
EOS PCT: 0.5 %
HCT: 38.5 % — AB (ref 40.0–52.0)
HGB: 12.8 g/dL — ABNORMAL LOW (ref 13.0–18.0)
Lymphocyte #: 2.9 10*3/uL (ref 1.0–3.6)
Lymphocyte %: 18.6 %
MCH: 32.4 pg (ref 26.0–34.0)
MCHC: 33.1 g/dL (ref 32.0–36.0)
MCV: 98 fL (ref 80–100)
MONOS PCT: 5.1 %
Monocyte #: 0.8 x10 3/mm (ref 0.2–1.0)
Neutrophil #: 11.8 10*3/uL — ABNORMAL HIGH (ref 1.4–6.5)
Neutrophil %: 75.6 %
Platelet: 275 10*3/uL (ref 150–440)
RBC: 3.93 10*6/uL — ABNORMAL LOW (ref 4.40–5.90)
RDW: 13.4 % (ref 11.5–14.5)
WBC: 15.7 10*3/uL — ABNORMAL HIGH (ref 3.8–10.6)

## 2013-11-28 ENCOUNTER — Emergency Department: Payer: Self-pay | Admitting: Emergency Medicine

## 2013-11-28 LAB — CBC
HCT: 41 % (ref 40.0–52.0)
HGB: 13.5 g/dL (ref 13.0–18.0)
MCH: 31.9 pg (ref 26.0–34.0)
MCHC: 32.8 g/dL (ref 32.0–36.0)
MCV: 97 fL (ref 80–100)
Platelet: 279 10*3/uL (ref 150–440)
RBC: 4.22 10*6/uL — AB (ref 4.40–5.90)
RDW: 13.3 % (ref 11.5–14.5)
WBC: 10 10*3/uL (ref 3.8–10.6)

## 2013-11-28 LAB — COMPREHENSIVE METABOLIC PANEL
ALBUMIN: 3.8 g/dL (ref 3.4–5.0)
AST: 16 U/L (ref 15–37)
Alkaline Phosphatase: 104 U/L
Anion Gap: 7 (ref 7–16)
BUN: 12 mg/dL (ref 7–18)
Bilirubin,Total: 0.4 mg/dL (ref 0.2–1.0)
CALCIUM: 9.1 mg/dL (ref 8.5–10.1)
CHLORIDE: 97 mmol/L — AB (ref 98–107)
CREATININE: 1.02 mg/dL (ref 0.60–1.30)
Co2: 26 mmol/L (ref 21–32)
EGFR (African American): >60
EGFR (Non-African Amer.): >60
Glucose: 455 mg/dL — ABNORMAL HIGH (ref 65–99)
Osmolality: 280 (ref 275–301)
Potassium: 3.9 mmol/L (ref 3.5–5.1)
SGPT (ALT): 9 U/L — ABNORMAL LOW (ref 12–78)
Sodium: 130 mmol/L — ABNORMAL LOW (ref 136–145)
Total Protein: 7.3 g/dL (ref 6.4–8.2)

## 2013-11-28 LAB — URINALYSIS, COMPLETE
BILIRUBIN, UR: NEGATIVE
BLOOD: NEGATIVE
Bacteria: NEGATIVE
Glucose,UR: >=500 mg/dL (ref 0–75)
Leukocyte Esterase: NEGATIVE
Nitrite: NEGATIVE
PROTEIN: NEGATIVE
Ph: 6 (ref 4.5–8.0)
SQUAMOUS EPITHELIAL: NONE SEEN
Specific Gravity: 1.027 (ref 1.003–1.030)

## 2013-11-28 LAB — DRUG SCREEN, URINE

## 2013-11-28 LAB — LIPASE, BLOOD: LIPASE: 105 U/L (ref 73–393)

## 2013-11-28 LAB — BETA-HYDROXYBUTYRIC ACID: Beta-Hydroxybutyrate: 19 mg/dL — ABNORMAL HIGH (ref 0.2–2.8)

## 2013-11-28 LAB — ETHANOL: Ethanol: <3 mg/dL

## 2013-12-01 LAB — CULTURE, BLOOD (SINGLE)

## 2014-01-19 ENCOUNTER — Inpatient Hospital Stay: Payer: Self-pay | Admitting: Internal Medicine

## 2014-01-19 LAB — URINALYSIS, COMPLETE
BACTERIA: NONE SEEN
Glucose,UR: >=500 mg/dL (ref 0–75)
Leukocyte Esterase: NEGATIVE
Nitrite: NEGATIVE
PH: 6 (ref 4.5–8.0)
PROTEIN: NEGATIVE
Specific Gravity: 1.022 (ref 1.003–1.030)
Squamous Epithelial: NONE SEEN
WBC UR: NONE SEEN /HPF (ref 0–5)

## 2014-01-19 LAB — COMPREHENSIVE METABOLIC PANEL
Albumin: 3.8 g/dL (ref 3.4–5.0)
Alkaline Phosphatase: 110 U/L
Anion Gap: 16 (ref 7–16)
BUN: 20 mg/dL — ABNORMAL HIGH (ref 7–18)
Bilirubin,Total: 0.7 mg/dL (ref 0.2–1.0)
CALCIUM: 8.2 mg/dL — AB (ref 8.5–10.1)
Chloride: 93 mmol/L — ABNORMAL LOW (ref 98–107)
Co2: 17 mmol/L — ABNORMAL LOW (ref 21–32)
Creatinine: 1.22 mg/dL (ref 0.60–1.30)
EGFR (African American): >60
Glucose: 924 mg/dL (ref 65–99)
Osmolality: 302 (ref 275–301)
POTASSIUM: 5.5 mmol/L — AB (ref 3.5–5.1)
SGOT(AST): 11 U/L — ABNORMAL LOW (ref 15–37)
SGPT (ALT): 11 U/L — ABNORMAL LOW (ref 12–78)
Sodium: 126 mmol/L — ABNORMAL LOW (ref 136–145)
TOTAL PROTEIN: 7.1 g/dL (ref 6.4–8.2)

## 2014-01-19 LAB — CBC
HCT: 41.3 % (ref 40.0–52.0)
HGB: 13 g/dL (ref 13.0–18.0)
MCH: 32.7 pg (ref 26.0–34.0)
MCHC: 31.5 g/dL — ABNORMAL LOW (ref 32.0–36.0)
MCV: 104 fL — AB (ref 80–100)
PLATELETS: 224 10*3/uL (ref 150–440)
RBC: 3.97 10*6/uL — ABNORMAL LOW (ref 4.40–5.90)
RDW: 14.4 % (ref 11.5–14.5)
WBC: 7.9 10*3/uL (ref 3.8–10.6)

## 2014-01-20 LAB — BASIC METABOLIC PANEL
Anion Gap: 8 (ref 7–16)
BUN: 15 mg/dL (ref 7–18)
CALCIUM: 8.2 mg/dL — AB (ref 8.5–10.1)
CO2: 25 mmol/L (ref 21–32)
CREATININE: 1.27 mg/dL (ref 0.60–1.30)
Chloride: 102 mmol/L (ref 98–107)
EGFR (Non-African Amer.): >60
GLUCOSE: 303 mg/dL — AB (ref 65–99)
Osmolality: 282 (ref 275–301)
Potassium: 3.5 mmol/L (ref 3.5–5.1)
SODIUM: 135 mmol/L — AB (ref 136–145)

## 2014-02-12 ENCOUNTER — Inpatient Hospital Stay: Payer: Self-pay | Admitting: Student

## 2014-02-12 LAB — URINALYSIS, COMPLETE
BLOOD: NEGATIVE
Bacteria: NONE SEEN
Bilirubin,UR: NEGATIVE
Glucose,UR: >=500 mg/dL (ref 0–75)
Leukocyte Esterase: NEGATIVE
Nitrite: NEGATIVE
PH: 6 (ref 4.5–8.0)
Protein: NEGATIVE
RBC,UR: NONE SEEN /HPF (ref 0–5)
SPECIFIC GRAVITY: 1.023 (ref 1.003–1.030)
Squamous Epithelial: NONE SEEN
WBC UR: NONE SEEN /HPF (ref 0–5)

## 2014-02-12 LAB — CBC WITH DIFFERENTIAL/PLATELET
BASOS PCT: 0.3 %
Basophil #: 0 10*3/uL (ref 0.0–0.1)
EOS PCT: 1.4 %
Eosinophil #: 0.1 10*3/uL (ref 0.0–0.7)
HCT: 46.4 % (ref 40.0–52.0)
HGB: 13.9 g/dL (ref 13.0–18.0)
LYMPHS ABS: 1.2 10*3/uL (ref 1.0–3.6)
Lymphocyte %: 18.3 %
MCH: 32.6 pg (ref 26.0–34.0)
MCHC: 30 g/dL — ABNORMAL LOW (ref 32.0–36.0)
MCV: 109 fL — AB (ref 80–100)
Monocyte #: 0.4 x10 3/mm (ref 0.2–1.0)
Monocyte %: 5.9 %
NEUTROS PCT: 74.1 %
Neutrophil #: 4.7 10*3/uL (ref 1.4–6.5)
Platelet: 205 10*3/uL (ref 150–440)
RBC: 4.27 10*6/uL — AB (ref 4.40–5.90)
RDW: 14.3 % (ref 11.5–14.5)
WBC: 6.3 10*3/uL (ref 3.8–10.6)

## 2014-02-12 LAB — BASIC METABOLIC PANEL
Anion Gap: 8 (ref 7–16)
BUN: 12 mg/dL (ref 7–18)
Calcium, Total: 9.3 mg/dL (ref 8.5–10.1)
Chloride: 87 mmol/L — ABNORMAL LOW (ref 98–107)
Co2: 26 mmol/L (ref 21–32)
Creatinine: 1.46 mg/dL — ABNORMAL HIGH (ref 0.60–1.30)
EGFR (Non-African Amer.): >60
GLUCOSE: 1314 mg/dL — AB (ref 65–99)
OSMOLALITY: 311 (ref 275–301)
POTASSIUM: 5.4 mmol/L — AB (ref 3.5–5.1)
Sodium: 121 mmol/L — ABNORMAL LOW (ref 136–145)

## 2014-02-13 LAB — CBC WITH DIFFERENTIAL/PLATELET
BASOS ABS: 0.1 10*3/uL (ref 0.0–0.1)
BASOS PCT: 0.6 %
Eosinophil #: 0.1 10*3/uL (ref 0.0–0.7)
Eosinophil %: 1.4 %
HCT: 42.7 % (ref 40.0–52.0)
HGB: 14 g/dL (ref 13.0–18.0)
LYMPHS PCT: 27.4 %
Lymphocyte #: 2.7 10*3/uL (ref 1.0–3.6)
MCH: 32.3 pg (ref 26.0–34.0)
MCHC: 32.9 g/dL (ref 32.0–36.0)
MCV: 98 fL (ref 80–100)
MONO ABS: 0.6 x10 3/mm (ref 0.2–1.0)
MONOS PCT: 5.8 %
Neutrophil #: 6.5 10*3/uL (ref 1.4–6.5)
Neutrophil %: 64.8 %
PLATELETS: 263 10*3/uL (ref 150–440)
RBC: 4.35 10*6/uL — ABNORMAL LOW (ref 4.40–5.90)
RDW: 13.3 % (ref 11.5–14.5)
WBC: 10 10*3/uL (ref 3.8–10.6)

## 2014-02-13 LAB — BASIC METABOLIC PANEL
ANION GAP: 7 (ref 7–16)
BUN: 10 mg/dL (ref 7–18)
CALCIUM: 9.6 mg/dL (ref 8.5–10.1)
CREATININE: 1.11 mg/dL (ref 0.60–1.30)
Chloride: 102 mmol/L (ref 98–107)
Co2: 27 mmol/L (ref 21–32)
Glucose: 350 mg/dL — ABNORMAL HIGH (ref 65–99)
Osmolality: 285 (ref 275–301)
POTASSIUM: 3.3 mmol/L — AB (ref 3.5–5.1)
Sodium: 136 mmol/L (ref 136–145)

## 2014-04-20 ENCOUNTER — Inpatient Hospital Stay: Payer: Self-pay | Admitting: Internal Medicine

## 2014-04-20 LAB — COMPREHENSIVE METABOLIC PANEL
AST: 10 U/L — AB (ref 15–37)
Albumin: 4.5 g/dL (ref 3.4–5.0)
Alkaline Phosphatase: 147 U/L — ABNORMAL HIGH
Anion Gap: 33 — ABNORMAL HIGH (ref 7–16)
BUN: 19 mg/dL — AB (ref 7–18)
Bilirubin,Total: 0.6 mg/dL (ref 0.2–1.0)
CALCIUM: 9.6 mg/dL (ref 8.5–10.1)
CO2: 11 mmol/L — AB (ref 21–32)
Chloride: 92 mmol/L — ABNORMAL LOW (ref 98–107)
Creatinine: 1.37 mg/dL — ABNORMAL HIGH (ref 0.60–1.30)
EGFR (African American): >60
EGFR (Non-African Amer.): >60
GLUCOSE: 727 mg/dL — AB (ref 65–99)
Osmolality: 309 (ref 275–301)
Potassium: 4.7 mmol/L (ref 3.5–5.1)
SGPT (ALT): 15 U/L (ref 12–78)
SODIUM: 136 mmol/L (ref 136–145)
TOTAL PROTEIN: 8.7 g/dL — AB (ref 6.4–8.2)

## 2014-04-20 LAB — BASIC METABOLIC PANEL
ANION GAP: 8 (ref 7–16)
Anion Gap: 13 (ref 7–16)
BUN: 16 mg/dL (ref 7–18)
BUN: 9 mg/dL (ref 7–18)
CALCIUM: 9.1 mg/dL (ref 8.5–10.1)
CHLORIDE: 104 mmol/L (ref 98–107)
Calcium, Total: 8.2 mg/dL — ABNORMAL LOW (ref 8.5–10.1)
Chloride: 108 mmol/L — ABNORMAL HIGH (ref 98–107)
Co2: 20 mmol/L — ABNORMAL LOW (ref 21–32)
Co2: 22 mmol/L (ref 21–32)
Creatinine: 1.33 mg/dL — ABNORMAL HIGH (ref 0.60–1.30)
Creatinine: 1.16 mg/dL (ref 0.60–1.30)
EGFR (African American): >60
EGFR (Non-African Amer.): >60
GLUCOSE: 158 mg/dL — AB (ref 65–99)
Glucose: 157 mg/dL — ABNORMAL HIGH (ref 65–99)
OSMOLALITY: 270 (ref 275–301)
Osmolality: 286 (ref 275–301)
Potassium: 3.6 mmol/L (ref 3.5–5.1)
Potassium: 3.1 mmol/L — ABNORMAL LOW (ref 3.5–5.1)
Sodium: 141 mmol/L (ref 136–145)
Sodium: 134 mmol/L — ABNORMAL LOW (ref 136–145)

## 2014-04-20 LAB — URINALYSIS, COMPLETE
Bacteria: NONE SEEN
Bilirubin,UR: NEGATIVE
Glucose,UR: >=500 mg/dL (ref 0–75)
Leukocyte Esterase: NEGATIVE
NITRITE: NEGATIVE
PH: 6 (ref 4.5–8.0)
PROTEIN: NEGATIVE
RBC,UR: 1 /HPF (ref 0–5)
SPECIFIC GRAVITY: 1.023 (ref 1.003–1.030)
SQUAMOUS EPITHELIAL: NONE SEEN
WBC UR: 1 /HPF (ref 0–5)

## 2014-04-20 LAB — CBC
HCT: 48.5 % (ref 40.0–52.0)
HGB: 14.9 g/dL (ref 13.0–18.0)
MCH: 31.6 pg (ref 26.0–34.0)
MCHC: 30.8 g/dL — ABNORMAL LOW (ref 32.0–36.0)
MCV: 103 fL — ABNORMAL HIGH (ref 80–100)
PLATELETS: 315 10*3/uL (ref 150–440)
RBC: 4.73 10*6/uL (ref 4.40–5.90)
RDW: 12.7 % (ref 11.5–14.5)
WBC: 18 10*3/uL — ABNORMAL HIGH (ref 3.8–10.6)

## 2014-04-20 LAB — LIPASE, BLOOD: Lipase: 51 U/L — ABNORMAL LOW (ref 73–393)

## 2014-04-21 LAB — CBC WITH DIFFERENTIAL/PLATELET
Basophil #: 0.1 10*3/uL (ref 0.0–0.1)
Basophil %: 0.5 %
EOS ABS: 0.2 10*3/uL (ref 0.0–0.7)
EOS PCT: 1.5 %
HCT: 36.5 % — AB (ref 40.0–52.0)
HGB: 12.4 g/dL — AB (ref 13.0–18.0)
LYMPHS PCT: 24.3 %
Lymphocyte #: 3.9 10*3/uL — ABNORMAL HIGH (ref 1.0–3.6)
MCH: 32.9 pg (ref 26.0–34.0)
MCHC: 33.8 g/dL (ref 32.0–36.0)
MCV: 97 fL (ref 80–100)
MONO ABS: 1 x10 3/mm (ref 0.2–1.0)
Monocyte %: 6.2 %
NEUTROS ABS: 10.7 10*3/uL — AB (ref 1.4–6.5)
NEUTROS PCT: 67.5 %
PLATELETS: 220 10*3/uL (ref 150–440)
RBC: 3.76 10*6/uL — ABNORMAL LOW (ref 4.40–5.90)
RDW: 12.4 % (ref 11.5–14.5)
WBC: 15.9 10*3/uL — ABNORMAL HIGH (ref 3.8–10.6)

## 2014-04-21 LAB — BASIC METABOLIC PANEL
Anion Gap: 9 (ref 7–16)
BUN: 7 mg/dL (ref 7–18)
CALCIUM: 8.3 mg/dL — AB (ref 8.5–10.1)
CHLORIDE: 106 mmol/L (ref 98–107)
CO2: 20 mmol/L — AB (ref 21–32)
CREATININE: 1.03 mg/dL (ref 0.60–1.30)
Glucose: 163 mg/dL — ABNORMAL HIGH (ref 65–99)
Osmolality: 272 (ref 275–301)
Potassium: 3.5 mmol/L (ref 3.5–5.1)
Sodium: 135 mmol/L — ABNORMAL LOW (ref 136–145)

## 2014-05-18 ENCOUNTER — Inpatient Hospital Stay: Payer: Self-pay | Admitting: Internal Medicine

## 2014-05-18 LAB — COMPREHENSIVE METABOLIC PANEL
ALBUMIN: 3.7 g/dL (ref 3.4–5.0)
ANION GAP: 12 (ref 7–16)
Alkaline Phosphatase: 96 U/L
BUN: 14 mg/dL (ref 7–18)
Bilirubin,Total: 0.9 mg/dL (ref 0.2–1.0)
CALCIUM: 8.6 mg/dL (ref 8.5–10.1)
CHLORIDE: 92 mmol/L — AB (ref 98–107)
CREATININE: 1.26 mg/dL (ref 0.60–1.30)
Co2: 24 mmol/L (ref 21–32)
EGFR (African American): >60
EGFR (Non-African Amer.): >60
Glucose: 910 mg/dL (ref 65–99)
Osmolality: 303 (ref 275–301)
POTASSIUM: 4.7 mmol/L (ref 3.5–5.1)
SGOT(AST): 10 U/L — ABNORMAL LOW (ref 15–37)
SGPT (ALT): 14 U/L
SODIUM: 128 mmol/L — AB (ref 136–145)
TOTAL PROTEIN: 7.3 g/dL (ref 6.4–8.2)

## 2014-05-18 LAB — CBC WITH DIFFERENTIAL/PLATELET
Basophil #: 0 10*3/uL (ref 0.0–0.1)
Basophil %: 0.4 %
EOS PCT: 0.7 %
Eosinophil #: 0 10*3/uL (ref 0.0–0.7)
HCT: 41.4 % (ref 40.0–52.0)
HGB: 13.4 g/dL (ref 13.0–18.0)
LYMPHS ABS: 1.1 10*3/uL (ref 1.0–3.6)
Lymphocyte %: 15.3 %
MCH: 33.2 pg (ref 26.0–34.0)
MCHC: 32.4 g/dL (ref 32.0–36.0)
MCV: 103 fL — AB (ref 80–100)
MONO ABS: 0.3 x10 3/mm (ref 0.2–1.0)
Monocyte %: 4.3 %
Neutrophil #: 5.6 10*3/uL (ref 1.4–6.5)
Neutrophil %: 79.3 %
PLATELETS: 226 10*3/uL (ref 150–440)
RBC: 4.04 10*6/uL — AB (ref 4.40–5.90)
RDW: 13.4 % (ref 11.5–14.5)
WBC: 7 10*3/uL (ref 3.8–10.6)

## 2014-05-18 LAB — URINALYSIS, COMPLETE
BILIRUBIN, UR: NEGATIVE
BLOOD: NEGATIVE
Bacteria: NONE SEEN
Glucose,UR: >=500 mg/dL (ref 0–75)
LEUKOCYTE ESTERASE: NEGATIVE
Nitrite: NEGATIVE
PH: 7 (ref 4.5–8.0)
PROTEIN: NEGATIVE
RBC, UR: NONE SEEN /HPF (ref 0–5)
Specific Gravity: 1.022 (ref 1.003–1.030)
Squamous Epithelial: NONE SEEN
WBC UR: NONE SEEN /HPF (ref 0–5)

## 2014-05-19 LAB — CBC WITH DIFFERENTIAL/PLATELET
BASOS ABS: 0 10*3/uL (ref 0.0–0.1)
Basophil %: 0.4 %
EOS ABS: 0.1 10*3/uL (ref 0.0–0.7)
Eosinophil %: 2 %
HCT: 38.4 % — AB (ref 40.0–52.0)
HGB: 12.9 g/dL — ABNORMAL LOW (ref 13.0–18.0)
LYMPHS PCT: 32.3 %
Lymphocyte #: 2.3 10*3/uL (ref 1.0–3.6)
MCH: 32.9 pg (ref 26.0–34.0)
MCHC: 33.6 g/dL (ref 32.0–36.0)
MCV: 98 fL (ref 80–100)
MONO ABS: 0.6 x10 3/mm (ref 0.2–1.0)
Monocyte %: 7.8 %
NEUTROS PCT: 57.5 %
Neutrophil #: 4.1 10*3/uL (ref 1.4–6.5)
PLATELETS: 223 10*3/uL (ref 150–440)
RBC: 3.92 10*6/uL — AB (ref 4.40–5.90)
RDW: 13 % (ref 11.5–14.5)
WBC: 7.1 10*3/uL (ref 3.8–10.6)

## 2014-05-19 LAB — BASIC METABOLIC PANEL
Anion Gap: 6 — ABNORMAL LOW (ref 7–16)
BUN: 10 mg/dL (ref 7–18)
CREATININE: 0.86 mg/dL (ref 0.60–1.30)
Calcium, Total: 8.4 mg/dL — ABNORMAL LOW (ref 8.5–10.1)
Chloride: 102 mmol/L (ref 98–107)
Co2: 29 mmol/L (ref 21–32)
EGFR (African American): >60
EGFR (Non-African Amer.): >60
Glucose: 209 mg/dL — ABNORMAL HIGH (ref 65–99)
Osmolality: 279 (ref 275–301)
POTASSIUM: 3.2 mmol/L — AB (ref 3.5–5.1)
Sodium: 137 mmol/L (ref 136–145)

## 2014-05-19 LAB — MAGNESIUM: Magnesium: 1.6 mg/dL — ABNORMAL LOW

## 2014-05-31 LAB — COMPREHENSIVE METABOLIC PANEL
ALBUMIN: 4.4 g/dL (ref 3.4–5.0)
ALT: 17 U/L
Alkaline Phosphatase: 121 U/L — ABNORMAL HIGH
Anion Gap: 16 (ref 7–16)
BUN: 18 mg/dL (ref 7–18)
Bilirubin,Total: 1.3 mg/dL — ABNORMAL HIGH (ref 0.2–1.0)
CALCIUM: 9.3 mg/dL (ref 8.5–10.1)
CHLORIDE: 86 mmol/L — AB (ref 98–107)
CREATININE: 1.68 mg/dL — AB (ref 0.60–1.30)
Co2: 21 mmol/L (ref 21–32)
EGFR (African American): >60
EGFR (Non-African Amer.): 54 — ABNORMAL LOW
Glucose: 1039 mg/dL (ref 65–99)
Osmolality: 302 (ref 275–301)
Potassium: 4.9 mmol/L (ref 3.5–5.1)
SGOT(AST): 16 U/L (ref 15–37)
Sodium: 123 mmol/L — ABNORMAL LOW (ref 136–145)
Total Protein: 8.4 g/dL — ABNORMAL HIGH (ref 6.4–8.2)

## 2014-05-31 LAB — URINALYSIS, COMPLETE
BACTERIA: NONE SEEN
BILIRUBIN, UR: NEGATIVE
Blood: NEGATIVE
Glucose,UR: >=500 mg/dL (ref 0–75)
LEUKOCYTE ESTERASE: NEGATIVE
NITRITE: NEGATIVE
Ph: 6 (ref 4.5–8.0)
Protein: NEGATIVE
RBC,UR: NONE SEEN /HPF (ref 0–5)
Specific Gravity: 1.026 (ref 1.003–1.030)
Squamous Epithelial: NONE SEEN
WBC UR: <1 /HPF (ref 0–5)

## 2014-05-31 LAB — CBC
HCT: 47.9 % (ref 40.0–52.0)
HGB: 15 g/dL (ref 13.0–18.0)
MCH: 32.8 pg (ref 26.0–34.0)
MCHC: 31.3 g/dL — ABNORMAL LOW (ref 32.0–36.0)
MCV: 105 fL — ABNORMAL HIGH (ref 80–100)
Platelet: 274 10*3/uL (ref 150–440)
RBC: 4.56 10*6/uL (ref 4.40–5.90)
RDW: 13.7 % (ref 11.5–14.5)
WBC: 9.1 10*3/uL (ref 3.8–10.6)

## 2014-06-01 ENCOUNTER — Inpatient Hospital Stay: Payer: Self-pay | Admitting: Internal Medicine

## 2014-06-01 LAB — BASIC METABOLIC PANEL
ANION GAP: 5 — AB (ref 7–16)
Anion Gap: 8 (ref 7–16)
BUN: 11 mg/dL (ref 7–18)
BUN: 12 mg/dL (ref 7–18)
CHLORIDE: 100 mmol/L (ref 98–107)
CREATININE: 0.93 mg/dL (ref 0.60–1.30)
Calcium, Total: 8.8 mg/dL (ref 8.5–10.1)
Calcium, Total: 8.6 mg/dL (ref 8.5–10.1)
Chloride: 101 mmol/L (ref 98–107)
Co2: 27 mmol/L (ref 21–32)
Co2: 28 mmol/L (ref 21–32)
Creatinine: 0.96 mg/dL (ref 0.60–1.30)
EGFR (African American): >60
EGFR (African American): >60
EGFR (Non-African Amer.): >60
GLUCOSE: 265 mg/dL — AB (ref 65–99)
Glucose: 191 mg/dL — ABNORMAL HIGH (ref 65–99)
OSMOLALITY: 275 (ref 275–301)
Osmolality: 277 (ref 275–301)
Potassium: 3.5 mmol/L (ref 3.5–5.1)
Potassium: 3.7 mmol/L (ref 3.5–5.1)
SODIUM: 134 mmol/L — AB (ref 136–145)
Sodium: 135 mmol/L — ABNORMAL LOW (ref 136–145)

## 2014-06-01 LAB — MRSA PCR SCREENING

## 2014-06-02 LAB — CBC WITH DIFFERENTIAL/PLATELET
Basophil #: 0 10*3/uL
Basophil %: 0.4 %
Eosinophil #: 0.3 10*3/uL
Eosinophil %: 3 %
HCT: 39.3 % — ABNORMAL LOW
HGB: 13.5 g/dL
Lymphocyte %: 35.3 %
Lymphs Abs: 3 10*3/uL
MCH: 33.3 pg
MCHC: 34.5 g/dL
MCV: 97 fL
Monocyte #: 0.4 10*3/uL
Monocyte %: 4.7 %
Neutrophil #: 4.8 10*3/uL
Neutrophil %: 56.6 %
Platelet: 223 10*3/uL
RBC: 4.06 x10 6/mm 3 — ABNORMAL LOW
RDW: 13.3 %
WBC: 8.6 10*3/uL

## 2014-06-02 LAB — BASIC METABOLIC PANEL WITH GFR
Anion Gap: 9
BUN: 9 mg/dL
Calcium, Total: 8.1 mg/dL — ABNORMAL LOW
Chloride: 104 mmol/L
Co2: 25 mmol/L
Creatinine: 0.72 mg/dL
EGFR (African American): >60
EGFR (Non-African Amer.): >60
Glucose: 214 mg/dL — ABNORMAL HIGH
Osmolality: 281
Potassium: 3.5 mmol/L
Sodium: 138 mmol/L

## 2014-06-02 LAB — MAGNESIUM: MAGNESIUM: 1.6 mg/dL — AB

## 2014-06-04 ENCOUNTER — Inpatient Hospital Stay: Payer: Self-pay | Admitting: Internal Medicine

## 2014-06-04 LAB — CBC
HCT: 44 % (ref 40.0–52.0)
HGB: 13.7 g/dL (ref 13.0–18.0)
MCH: 32.2 pg (ref 26.0–34.0)
MCHC: 31.1 g/dL — ABNORMAL LOW (ref 32.0–36.0)
MCV: 104 fL — ABNORMAL HIGH (ref 80–100)
Platelet: 240 10*3/uL (ref 150–440)
RBC: 4.25 10*6/uL — AB (ref 4.40–5.90)
RDW: 13.6 % (ref 11.5–14.5)
WBC: 7.5 10*3/uL (ref 3.8–10.6)

## 2014-06-04 LAB — URINALYSIS, COMPLETE
BLOOD: NEGATIVE
Bacteria: NONE SEEN
Bilirubin,UR: NEGATIVE
Glucose,UR: >=500 mg/dL (ref 0–75)
Ketone: NEGATIVE
Leukocyte Esterase: NEGATIVE
Nitrite: NEGATIVE
PH: 7 (ref 4.5–8.0)
PROTEIN: NEGATIVE
RBC,UR: <1 /HPF (ref 0–5)
SPECIFIC GRAVITY: 1.018 (ref 1.003–1.030)
Squamous Epithelial: NONE SEEN
WBC UR: NONE SEEN /HPF (ref 0–5)

## 2014-06-04 LAB — COMPREHENSIVE METABOLIC PANEL
ANION GAP: 7 (ref 7–16)
Albumin: 4.5 g/dL (ref 3.4–5.0)
Alkaline Phosphatase: 109 U/L
BUN: 14 mg/dL (ref 7–18)
Bilirubin,Total: 0.5 mg/dL (ref 0.2–1.0)
CHLORIDE: 86 mmol/L — AB (ref 98–107)
CREATININE: 1.34 mg/dL — AB (ref 0.60–1.30)
Calcium, Total: 9.5 mg/dL (ref 8.5–10.1)
Co2: 26 mmol/L (ref 21–32)
EGFR (Non-African Amer.): >60
Glucose: 1105 mg/dL (ref 65–99)
Osmolality: 297 (ref 275–301)
Potassium: 5.9 mmol/L — ABNORMAL HIGH (ref 3.5–5.1)
SGOT(AST): 6 U/L — ABNORMAL LOW (ref 15–37)
SGPT (ALT): 15 U/L
SODIUM: 119 mmol/L — AB (ref 136–145)
Total Protein: 8.3 g/dL — ABNORMAL HIGH (ref 6.4–8.2)

## 2014-06-04 LAB — BASIC METABOLIC PANEL
Anion Gap: 10 (ref 7–16)
BUN: 14 mg/dL (ref 7–18)
CALCIUM: 9 mg/dL (ref 8.5–10.1)
CHLORIDE: 95 mmol/L — AB (ref 98–107)
CO2: 28 mmol/L (ref 21–32)
CREATININE: 1.14 mg/dL (ref 0.60–1.30)
EGFR (Non-African Amer.): >60
Glucose: 685 mg/dL (ref 65–99)
Osmolality: 299 (ref 275–301)
Potassium: 4.4 mmol/L (ref 3.5–5.1)
Sodium: 133 mmol/L — ABNORMAL LOW (ref 136–145)

## 2014-06-04 LAB — TROPONIN I: Troponin-I: <0.02 ng/mL

## 2014-06-05 LAB — BASIC METABOLIC PANEL
ANION GAP: 5 — AB (ref 7–16)
BUN: 8 mg/dL (ref 7–18)
CALCIUM: 9 mg/dL (ref 8.5–10.1)
CO2: 29 mmol/L (ref 21–32)
CREATININE: 0.84 mg/dL (ref 0.60–1.30)
Chloride: 101 mmol/L (ref 98–107)
EGFR (African American): >60
EGFR (Non-African Amer.): >60
Glucose: 217 mg/dL — ABNORMAL HIGH (ref 65–99)
Osmolality: 275 (ref 275–301)
Potassium: 3.8 mmol/L (ref 3.5–5.1)
Sodium: 135 mmol/L — ABNORMAL LOW (ref 136–145)

## 2014-06-05 LAB — CBC WITH DIFFERENTIAL/PLATELET
BASOS ABS: 0 10*3/uL (ref 0.0–0.1)
BASOS PCT: 0.6 %
EOS ABS: 0.3 10*3/uL (ref 0.0–0.7)
Eosinophil %: 3.9 %
HCT: 40.5 % (ref 40.0–52.0)
HGB: 13.8 g/dL (ref 13.0–18.0)
LYMPHS ABS: 2.5 10*3/uL (ref 1.0–3.6)
Lymphocyte %: 33.4 %
MCH: 33.2 pg (ref 26.0–34.0)
MCHC: 34 g/dL (ref 32.0–36.0)
MCV: 98 fL (ref 80–100)
MONO ABS: 0.5 x10 3/mm (ref 0.2–1.0)
Monocyte %: 6.3 %
NEUTROS PCT: 55.8 %
Neutrophil #: 4.2 10*3/uL (ref 1.4–6.5)
Platelet: 249 10*3/uL (ref 150–440)
RBC: 4.14 10*6/uL — AB (ref 4.40–5.90)
RDW: 13.3 % (ref 11.5–14.5)
WBC: 7.6 10*3/uL (ref 3.8–10.6)

## 2014-07-15 ENCOUNTER — Inpatient Hospital Stay: Payer: Self-pay | Admitting: Internal Medicine

## 2014-07-15 LAB — DRUG SCREEN, URINE

## 2014-07-15 LAB — CBC
HCT: 46.1 % (ref 40.0–52.0)
HGB: 14.5 g/dL (ref 13.0–18.0)
MCH: 32.4 pg (ref 26.0–34.0)
MCHC: 31.6 g/dL — ABNORMAL LOW (ref 32.0–36.0)
MCV: 103 fL — ABNORMAL HIGH (ref 80–100)
Platelet: 357 10*3/uL (ref 150–440)
RBC: 4.49 10*6/uL (ref 4.40–5.90)
RDW: 13.8 % (ref 11.5–14.5)
WBC: 23.1 10*3/uL — ABNORMAL HIGH (ref 3.8–10.6)

## 2014-07-15 LAB — URINALYSIS, COMPLETE
BILIRUBIN, UR: NEGATIVE
Bacteria: NONE SEEN
Glucose,UR: >=500 mg/dL (ref 0–75)
LEUKOCYTE ESTERASE: NEGATIVE
Nitrite: NEGATIVE
PH: 5 (ref 4.5–8.0)
Protein: NEGATIVE
RBC,UR: <1 /HPF (ref 0–5)
SPECIFIC GRAVITY: 1.022 (ref 1.003–1.030)
Squamous Epithelial: <1

## 2014-07-15 LAB — CBC WITH DIFFERENTIAL/PLATELET
BASOS PCT: 0.1 %
Basophil #: 0 10*3/uL (ref 0.0–0.1)
EOS ABS: 0 10*3/uL (ref 0.0–0.7)
Eosinophil %: 0.1 %
HCT: 37.6 % — ABNORMAL LOW (ref 40.0–52.0)
HGB: 12.4 g/dL — ABNORMAL LOW (ref 13.0–18.0)
LYMPHS ABS: 3.3 10*3/uL (ref 1.0–3.6)
LYMPHS PCT: 16.8 %
MCH: 31.5 pg (ref 26.0–34.0)
MCHC: 33 g/dL (ref 32.0–36.0)
MCV: 96 fL (ref 80–100)
MONO ABS: 1.4 x10 3/mm — AB (ref 0.2–1.0)
MONOS PCT: 7 %
Neutrophil #: 14.8 10*3/uL — ABNORMAL HIGH (ref 1.4–6.5)
Neutrophil %: 76 %
Platelet: 290 10*3/uL (ref 150–440)
RBC: 3.94 10*6/uL — AB (ref 4.40–5.90)
RDW: 13 % (ref 11.5–14.5)
WBC: 19.5 10*3/uL — AB (ref 3.8–10.6)

## 2014-07-15 LAB — COMPREHENSIVE METABOLIC PANEL
ANION GAP: 27 — AB (ref 7–16)
Albumin: 4.7 g/dL (ref 3.4–5.0)
Alkaline Phosphatase: 135 U/L — ABNORMAL HIGH
BILIRUBIN TOTAL: 0.8 mg/dL (ref 0.2–1.0)
BUN: 25 mg/dL — ABNORMAL HIGH (ref 7–18)
CREATININE: 1.67 mg/dL — AB (ref 0.60–1.30)
Calcium, Total: 9.2 mg/dL (ref 8.5–10.1)
Chloride: 84 mmol/L — ABNORMAL LOW (ref 98–107)
Co2: 11 mmol/L — ABNORMAL LOW (ref 21–32)
GFR CALC NON AF AMER: 52 — AB
Glucose: 960 mg/dL (ref 65–99)
OSMOLALITY: 298 (ref 275–301)
POTASSIUM: 5.7 mmol/L — AB (ref 3.5–5.1)
SGOT(AST): 8 U/L — ABNORMAL LOW (ref 15–37)
SGPT (ALT): 14 U/L
Sodium: 122 mmol/L — ABNORMAL LOW (ref 136–145)
TOTAL PROTEIN: 8.3 g/dL — AB (ref 6.4–8.2)

## 2014-07-15 LAB — BASIC METABOLIC PANEL
ANION GAP: 17 — AB (ref 7–16)
Anion Gap: 8 (ref 7–16)
BUN: 21 mg/dL — ABNORMAL HIGH (ref 7–18)
BUN: 14 mg/dL (ref 7–18)
CALCIUM: 8.2 mg/dL — AB (ref 8.5–10.1)
CHLORIDE: 100 mmol/L (ref 98–107)
CO2: 24 mmol/L (ref 21–32)
Calcium, Total: 8.5 mg/dL (ref 8.5–10.1)
Chloride: 101 mmol/L (ref 98–107)
Co2: 16 mmol/L — ABNORMAL LOW (ref 21–32)
Creatinine: 1.44 mg/dL — ABNORMAL HIGH (ref 0.60–1.30)
Creatinine: 1.16 mg/dL (ref 0.60–1.30)
EGFR (African American): >60
Glucose: 315 mg/dL — ABNORMAL HIGH (ref 65–99)
Glucose: 158 mg/dL — ABNORMAL HIGH (ref 65–99)
OSMOLALITY: 270 (ref 275–301)
Osmolality: 281 (ref 275–301)
POTASSIUM: 3.6 mmol/L (ref 3.5–5.1)
Potassium: 3.2 mmol/L — ABNORMAL LOW (ref 3.5–5.1)
SODIUM: 133 mmol/L — AB (ref 136–145)
SODIUM: 133 mmol/L — AB (ref 136–145)

## 2014-07-17 LAB — URINALYSIS, COMPLETE
BACTERIA: NONE SEEN
BILIRUBIN, UR: NEGATIVE
Leukocyte Esterase: NEGATIVE
Nitrite: NEGATIVE
PH: 5 (ref 4.5–8.0)
Protein: NEGATIVE
Specific Gravity: 1.022 (ref 1.003–1.030)
WBC UR: NONE SEEN /HPF (ref 0–5)

## 2014-07-17 LAB — CBC
HCT: 48.4 % (ref 40.0–52.0)
HGB: 15.2 g/dL (ref 13.0–18.0)
MCH: 32.1 pg (ref 26.0–34.0)
MCHC: 31.3 g/dL — ABNORMAL LOW (ref 32.0–36.0)
MCV: 103 fL — AB (ref 80–100)
PLATELETS: 387 10*3/uL (ref 150–440)
RBC: 4.72 10*6/uL (ref 4.40–5.90)
RDW: 13.7 % (ref 11.5–14.5)
WBC: 18 10*3/uL — ABNORMAL HIGH (ref 3.8–10.6)

## 2014-07-17 LAB — COMPREHENSIVE METABOLIC PANEL
ALT: 18 U/L
AST: 16 U/L (ref 15–37)
Albumin: 4.4 g/dL (ref 3.4–5.0)
Alkaline Phosphatase: 151 U/L — ABNORMAL HIGH
Anion Gap: 30 — ABNORMAL HIGH (ref 7–16)
BUN: 26 mg/dL — ABNORMAL HIGH (ref 7–18)
Bilirubin,Total: 0.8 mg/dL (ref 0.2–1.0)
CO2: 10 mmol/L — AB (ref 21–32)
Calcium, Total: 9.3 mg/dL (ref 8.5–10.1)
Chloride: 89 mmol/L — ABNORMAL LOW (ref 98–107)
Creatinine: 1.36 mg/dL — ABNORMAL HIGH (ref 0.60–1.30)
EGFR (African American): >60
Glucose: 795 mg/dL (ref 65–99)
OSMOLALITY: 302 (ref 275–301)
POTASSIUM: 5.4 mmol/L — AB (ref 3.5–5.1)
Sodium: 129 mmol/L — ABNORMAL LOW (ref 136–145)
TOTAL PROTEIN: 8.6 g/dL — AB (ref 6.4–8.2)

## 2014-07-17 LAB — LIPASE, BLOOD: Lipase: 55 U/L — ABNORMAL LOW (ref 73–393)

## 2014-07-18 ENCOUNTER — Inpatient Hospital Stay: Payer: Self-pay | Admitting: Internal Medicine

## 2014-07-18 LAB — BASIC METABOLIC PANEL
ANION GAP: 19 — AB (ref 7–16)
Anion Gap: 11 (ref 7–16)
Anion Gap: 12 (ref 7–16)
BUN: 24 mg/dL — ABNORMAL HIGH (ref 7–18)
BUN: 18 mg/dL (ref 7–18)
BUN: 13 mg/dL (ref 7–18)
CALCIUM: 8.9 mg/dL (ref 8.5–10.1)
CHLORIDE: 108 mmol/L — AB (ref 98–107)
CREATININE: 1.42 mg/dL — AB (ref 0.60–1.30)
CREATININE: 0.95 mg/dL (ref 0.60–1.30)
Calcium, Total: 8.4 mg/dL — ABNORMAL LOW (ref 8.5–10.1)
Calcium, Total: 8.1 mg/dL — ABNORMAL LOW (ref 8.5–10.1)
Chloride: 105 mmol/L (ref 98–107)
Chloride: 107 mmol/L (ref 98–107)
Co2: 16 mmol/L — ABNORMAL LOW (ref 21–32)
Co2: 18 mmol/L — ABNORMAL LOW (ref 21–32)
Co2: 16 mmol/L — ABNORMAL LOW (ref 21–32)
Creatinine: 1.2 mg/dL (ref 0.60–1.30)
EGFR (African American): >60
EGFR (African American): >60
EGFR (African American): >60
EGFR (Non-African Amer.): >60
GLUCOSE: 232 mg/dL — AB (ref 65–99)
Glucose: 254 mg/dL — ABNORMAL HIGH (ref 65–99)
Glucose: 134 mg/dL — ABNORMAL HIGH (ref 65–99)
OSMOLALITY: 292 (ref 275–301)
OSMOLALITY: 278 (ref 275–301)
OSMOLALITY: 278 (ref 275–301)
POTASSIUM: 3.9 mmol/L (ref 3.5–5.1)
Potassium: 4.2 mmol/L (ref 3.5–5.1)
Potassium: 4.3 mmol/L (ref 3.5–5.1)
SODIUM: 140 mmol/L (ref 136–145)
SODIUM: 135 mmol/L — AB (ref 136–145)
Sodium: 137 mmol/L (ref 136–145)

## 2014-07-19 LAB — CBC WITH DIFFERENTIAL/PLATELET
Basophil #: 0 10*3/uL (ref 0.0–0.1)
Basophil %: 0.1 %
Eosinophil #: 0.1 10*3/uL (ref 0.0–0.7)
Eosinophil %: 1.3 %
HCT: 39.9 % — ABNORMAL LOW (ref 40.0–52.0)
HGB: 13.3 g/dL (ref 13.0–18.0)
LYMPHS PCT: 28.2 %
Lymphocyte #: 2.7 10*3/uL (ref 1.0–3.6)
MCH: 32.7 pg (ref 26.0–34.0)
MCHC: 33.2 g/dL (ref 32.0–36.0)
MCV: 99 fL (ref 80–100)
Monocyte #: 0.4 x10 3/mm (ref 0.2–1.0)
Monocyte %: 4.7 %
Neutrophil #: 6.4 10*3/uL (ref 1.4–6.5)
Neutrophil %: 65.7 %
PLATELETS: 243 10*3/uL (ref 150–440)
RBC: 4.06 10*6/uL — AB (ref 4.40–5.90)
RDW: 13.4 % (ref 11.5–14.5)
WBC: 9.7 10*3/uL (ref 3.8–10.6)

## 2014-07-19 LAB — BASIC METABOLIC PANEL
Anion Gap: 14 (ref 7–16)
BUN: 10 mg/dL (ref 7–18)
CO2: 18 mmol/L — AB (ref 21–32)
Calcium, Total: 8.1 mg/dL — ABNORMAL LOW (ref 8.5–10.1)
Chloride: 101 mmol/L (ref 98–107)
Creatinine: 0.9 mg/dL (ref 0.60–1.30)
EGFR (Non-African Amer.): >60
Glucose: 438 mg/dL — ABNORMAL HIGH (ref 65–99)
OSMOLALITY: 284 (ref 275–301)
Potassium: 4.4 mmol/L (ref 3.5–5.1)
SODIUM: 133 mmol/L — AB (ref 136–145)

## 2015-01-06 ENCOUNTER — Inpatient Hospital Stay: Payer: Self-pay | Admitting: Internal Medicine

## 2015-01-20 ENCOUNTER — Emergency Department: Admit: 2015-01-20 | Disposition: A | Discharge status: Home / Self Care | Payer: Self-pay | Admitting: Student

## 2015-01-20 LAB — CBC
HCT: 47.3 % (ref 40.0–52.0)
HGB: 15.2 g/dL (ref 13.0–18.0)
MCH: 32.7 pg (ref 26.0–34.0)
MCHC: 32.1 g/dL (ref 32.0–36.0)
MCV: 102 fL — ABNORMAL HIGH (ref 80–100)
Platelet: 313 10*3/uL (ref 150–440)
RBC: 4.64 10*6/uL (ref 4.40–5.90)
RDW: 12.9 % (ref 11.5–14.5)
WBC: 9.4 10*3/uL (ref 3.8–10.6)

## 2015-01-20 LAB — COMPREHENSIVE METABOLIC PANEL
ALBUMIN: 4.7 g/dL
ANION GAP: 12 (ref 7–16)
AST: 30 U/L
Alkaline Phosphatase: 123 U/L
BUN: 12 mg/dL
Bilirubin,Total: 0.9 mg/dL
CALCIUM: 9.3 mg/dL
Chloride: 91 mmol/L — ABNORMAL LOW
Co2: 20 mmol/L — ABNORMAL LOW
Creatinine: 1.22 mg/dL
EGFR (African American): >60
EGFR (Non-African Amer.): >60
GLUCOSE: 847 mg/dL — AB
POTASSIUM: 4.3 mmol/L
SGPT (ALT): 16 U/L — ABNORMAL LOW
Sodium: 123 mmol/L — ABNORMAL LOW
Total Protein: 8.1 g/dL

## 2015-01-20 LAB — URINALYSIS, COMPLETE
Bacteria: NONE SEEN
Bilirubin,UR: NEGATIVE
Blood: NEGATIVE
Glucose,UR: >=500 mg/dL (ref 0–75)
LEUKOCYTE ESTERASE: NEGATIVE
Nitrite: NEGATIVE
PROTEIN: NEGATIVE
Ph: 6 (ref 4.5–8.0)
RBC, UR: NONE SEEN /HPF (ref 0–5)
Specific Gravity: 1.023 (ref 1.003–1.030)
Squamous Epithelial: NONE SEEN
WBC UR: <1 /HPF (ref 0–5)

## 2015-01-20 LAB — LIPASE, BLOOD: LIPASE: 44 U/L

## 2015-01-20 LAB — BETA-HYDROXYBUTYRIC ACID: BETA-HYDROXYBUTYRATE: 0.37 mmol/L — AB

## 2015-01-20 LAB — MAGNESIUM: Magnesium: 2 mg/dL

## 2015-01-20 LAB — LACTIC ACID, PLASMA: Lactic Acid, Venous: 4.1 mmol/L

## 2015-02-08 NOTE — Op Note (Signed)
PATIENT NAME:  Mark [name], Mark [name] MR#:  [id number] DATE OF BIRTH:  11-26-1985  DATE OF PROCEDURE:  08/22/2012  PREOPERATIVE DIAGNOSIS: Left buttock abscess.  POSTOPERATIVE DIAGNOSIS:  Left buttock abscess.  PROCEDURE:  Incision and drainage of buttock abscess.  SURGEON:  Tiney Rougealph Ely, MD  ANESTHESIA:  Monitored anesthetic care.  OPERATIVE PROCEDURE:  With the patient in the supine position after the induction of appropriate intravenous sedation, the patient was then moved into the lateral position. The abscess was prepped with Betadine and draped with sterile towels. It was opened bluntly. There did not appear to be any evidence of any retained packing. The area was explored and appeared to track laterally. A counter incision was not performed as the abscess cavity did not appear to be that big.  A Penrose drain was placed to ensure that the skin remained separated.   The drain was secured with 3-0 nylon. Sterile dressings were applied. The patient was returned to the recovery room having tolerated the procedure well. Sponge, instrument, and needle counts were correct times two in the operating room.    ____________________________ Quentin Orealph L. Ely III, MD rle:bjt D: 08/22/2012 10:23:45 ET T: 08/22/2012 10:34:31 ET JOB#: [id number]  cc: Quentin Orealph L. Ely III, MD, <Dictator> Quentin OreALPH L ELY MD ELECTRONICALLY SIGNED 08/22/2012 18:15

## 2015-02-08 NOTE — H&P (Signed)
PATIENT NAME:  Mark [name], CHENAULT MR#:  [id number] DATE OF BIRTH:  [birth year]  DATE OF ADMISSION:  06/17/2012  PRIMARY CARE PHYSICIAN: None. Scheduled to see [facility], but was unable to see them due to transportation issues.   REQUESTING PHYSICIAN: Governor Rooks, M.D.   CHIEF COMPLAINT: Nausea, vomiting, and fall.   HISTORY OF PRESENT ILLNESS: The patient is a 29 year old male with a known history of type 2 diabetes who ran out of insulin for about five days, started having nausea and vomiting for the last few days but was able to manage himself. He started feeling worse this morning. His mouth was hurting. He fell twice as he was feeling very dizzy and blacked out. He came down to the Emergency Department as he was feeling terrible. While in the ED he was found to be in DKA with blood sugar of 927 with anion gap of 21. He is being admitted for further evaluation and management.   PAST MEDICAL HISTORY:    1. Type 2 diabetes on insulin drip.  2. Chronic low back pain.  3. Hyperlipidemia.  4. Hypertension.  5. Bipolar disorder. 6. History of methicillin-resistant Staphylococcus aureus. 7. Nephrolithiasis.   ALLERGIES: Penicillin, Augmentin, and sulfa.   SOCIAL HISTORY: He smokes 1 pack of cigarettes daily for at least 10 to 15 years. No alcohol use.   FAMILY HISTORY: Positive for diabetes and hypertension.   REVIEW OF SYSTEMS: CONSTITUTIONAL: No fever. Positive fatigue and weakness. EYES: No blurred or double vision. ENT: No tinnitus or ear pain. RESPIRATORY: No cough, wheezing or hemoptysis. CARDIOVASCULAR: No chest pain, orthopnea, or edema. GASTROINTESTINAL: Positive for nausea and vomiting. No diarrhea or abdominal pain. GENITOURINARY: No dysuria or hematuria. ENDOCRINE: Positive for uncontrolled diabetes. HEMATOLOGY: No anemia or easy bruising. SKIN: No rash or lesion. MUSCULOSKELETAL: No arthritis. Positive for muscle ache. NEUROLOGIC: No tingling, numbness, or weakness.  PSYCHOLOGIC: No history of anxiety or depression.   MEDICATION AT HOME: Novolin 70/30, fifty-five units subcutaneous in the morning, thirty-five units subcutaneous every evening.   PHYSICAL EXAMINATION:  VITAL SIGNS: Temperature 98.2, heart rate 114 per minute, respirations 16 per minute, blood pressure 156/90. He s saturating 97% on room air.   GENERAL: The patient is a 26-yer-old male lying in the bed comfortably without any acute distress.   EYES: Pupils equal, round, reactive to light and accommodation. No scleral icterus. Extraocular muscles intact.   HEENT: Head atraumatic, normocephalic. Oropharynx and nasopharynx clear.   NECK: Supple. No jugular venous distention. No thyroid enlargement or tenderness.   LUNGS: Clear to auscultation bilaterally. No wheezing, rales, rhonchi, or crepitation.   ABDOMEN: Soft, nontender, nondistended. Bowel sounds present. No organomegaly or mass.   EXTREMITIES: No pedal edema, cyanosis, or clubbing.   NEUROLOGIC: Nonfocal examination. Cranial nerves III through XII intact. Muscle strength 5/5 in extremities. Sensation intact.   PSYCHIATRIC: The patient is oriented to time, place and person x3.   SKIN:  He has some red rash, seems kind of bug-bite and surrounding erythema. There is no open wound. Similar healing lesions on his left buttock.   LABORATORY, RADIOLOGICAL AND DIAGNOSTIC DATA: Sodium 123, potassium 4.1, chloride 84, CO2 18, BUN 22, creatinine 1.55, blood pressure 927. Anion gap of 21. CBC showed white count of 18.4, otherwise within normal limits. Urinalysis is negative. PH of 7.24, pCO2 35.   IMPRESSION AND PLAN:  1. DKA. Will start him on insulin drip. DKA protocol. Consult endocrinology, dietitian and also diabetic educator. Switch him over to  subcutaneous insulin once anion gap is closed. Monitor BMP every four hours.  2. Pseudohyponatremia, likely due to uncontrolled blood sugar. Will monitor basic metabolic panel every four hours.  Aggressive IV hydration with normal saline.  3. Acute renal failure, likely prerenal. Avoid any nephrotoxins. Hydrate him with IV fluids. Monitor renal function.  4. Leukocytosis, likely due to diabetic ketoacidosis. We will monitor his white count. Urinalysis is negative. He does not have any other infectious signs or symptoms. He does not have any signs of infection signs or symptoms. He does have some rash on his extremities and the buttocks which do not seem infected. We will get MRSA nasal swab to make sure whether he is a carrier as he does report history of methicillin-resistant Staphylococcus aureus.  5. Tobacco abuse. The patient smokes about 1 pack of cigarettes daily for the last several years, at least 10 to 15 years. He does report need of nicotine patch and will order one. Also, provide smoking cessation material. Smoking cessation counseling was provided for about three minutes. He denies any intention of quitting in the near future.   TOTAL TIME TAKING CARE OF THIS PATIENT including co-ordination of care and review of records and discussion with ED physician: 55 minutes.   ____________________________ Ellamae SiaVipul S. Sherryll BurgerShah, MD vss:ap D: 06/17/2012 15:03:42 ET T: 06/17/2012 15:49:51 ET JOB#: [id number]  cc: Samauri Kellenberger S. Sherryll BurgerShah, MD, <Dictator> [facility] Ellamae SiaVIPUL S [facility] SeriesHAH MD ELECTRONICALLY SIGNED 06/17/2012 18:41

## 2015-02-08 NOTE — Discharge Summary (Signed)
PATIENT NAME:  Mark [name], Mark [name] MR#:  [id number] DATE OF BIRTH:  [birth year]  DATE OF ADMISSION:  06/17/2012 DATE OF DISCHARGE:  06/18/2012  FINAL DIAGNOSES: 1. Diabetic ketoacidosis.  2. Pseudohyponatremia.  3. Acute renal failure.  4. Leukocytosis.  5. Tobacco abuse.   MEDICATIONS ON DISCHARGE:  70/30 insulin, 50 units with supper, 35 units with breakfast subcutaneous injection.   DIET: Carbohydrate-controlled diet, regular consistency.   ACTIVITY: Activity as tolerated.   FOLLOWUP:  He can follow up at the [facility] if he chooses to do so. Follow up in 1 to 2 weeks with the [facility].   REASON FOR ADMISSION: The patient was admitted with nausea, vomiting, and fall.   HISTORY OF PRESENT ILLNESS: 29 year old man with type 2 diabetes ran out of his insulin for five days. He had nausea and vomiting, started to feel worse. He came to the ER, was in DKA with a blood sugar 927, anion gap of 21. He was admitted to the Critical Care Unit for insulin drip.  Endocrine consultation was done by Dr. Tedd SiasSolum. The patient had pseudohyponatremia from the DKA and acute renal failure and was given IV fluid hydration.   LABORATORY AND RADIOLOGICAL DATA: First blood glucose fingerstick greater than 600. Sugar on the chemistry was 927, BUN 22, creatinine 1.55, sodium 123, potassium 4.1, chloride 84, CO2 18, calcium 9.8, anion gap 21. White blood cell count 18.4, hemoglobin and hematocrit 13.4 and 42.1, platelet count 377.  Urinalysis:  2+ ketones. ABG: venous pH of 7.24. Creatinine upon discharge 0.75. Glucose upon discharge 180.    HOSPITAL COURSE PER PROBLEM LIST:  1. Diabetic ketoacidosis: The patient was put on insulin drip protocol, was switched over to Lantus by the nursing staff on conversion once the gap closed and sugars remained low. For cost reasons the patient was switched back to his 70/30 insulin twice a day. I told him that he must take his insulin or he will end up back  in the hospital. The patient does have transportation issues, so I am not sure if he will follow up, but he is able to buy insulin over-the-counter in order to control his diabetes. Overall prognosis is poor with lack of compliance with medications, high risk for complications in the future.  2. Pseudohyponatremia: Improved with IV fluid hydration and controlling of the sugars.  3. Acute renal failure: Creatinine improved upon discharge.  4. Leukocytosis: Most likely secondary to the diabetic ketoacidosis.   5. Tobacco abuse: The patient refused a nicotine patch.   TIME SPENT ON DISCHARGE: 35 minutes.   ____________________________ Herschell Dimesichard J. Renae GlossWieting, MD rjw:bjt D: 06/18/2012 16:39:30 ET T: 06/19/2012 12:25:15 ET JOB#: [id number]  cc: Herschell Dimesichard J. Renae GlossWieting, MD, <Dictator> [facility] Salley ScarletICHARD J Shaquelle Hernon MD ELECTRONICALLY SIGNED 06/28/2012 14:36

## 2015-02-08 NOTE — Consult Note (Signed)
PATIENT NAME:  Mark [name], Mark [name] MR#:  [id number] DATE OF BIRTH:  12-27-1985  DATE OF CONSULTATION:  08/21/2012  REFERRING PHYSICIAN:   Shaune Pollack, MD CONSULTING PHYSICIAN:  Rosalyn Gess. Romario Tith, MD  REASON FOR CONSULTATION: Buttock abscess in DKA.   HISTORY OF PRESENT ILLNESS: The patient is a 29 year old white man with a past history significant for type 1 diabetes and bipolar disorder who was admitted on 08/19/2012 with nausea, vomiting, and malaise. The patient had run out of his insulin several days earlier and began having symptoms of the nausea and vomiting. He had been having pain in his left buttock as well. He has history of noncompliance with his insulin and multiple episodes of DKA. He also reports multiple boils on the buttock and was diagnosed with MRSA in 2009. On admission his white count was 22,000. He underwent incision and drainage in the ER and the cultures are currently growing Staphylococcus aureus. Blood cultures are growing a gram-positive rod and coagulase-negative staph. He is currently on vancomycin. He states he had some fevers and chills but these have resolved. He still has pain in the left buttock. His nausea and vomiting symptoms have resolved.   ALLERGIES: Augmentin, penicillin and sulfa.   PAST MEDICAL HISTORY:  1. Type 1 diabetes.  2. Bipolar disorder.  3. Hypercholesterolemia.  4. Hypertension.  5. MRSA in 2009.  6. Nephrolithiasis status post lithotripsy.   SOCIAL HISTORY: The patient lives with his mother. He smokes half pack cigarettes per day. No alcohol. No injecting drug use. He has a dog and a cat at home, but has not had no scratches or bites from the animals.   FAMILY HISTORY: Positive for diabetes and hypertension.   REVIEW OF SYSTEMS: GENERAL: Positive fevers, chills, and malaise. General weakness. HEENT: No headaches. No sinus congestion. No sore throat. NECK: No stiffness. No swollen glands. RESPIRATORY: No cough or shortness of breath. No sputum  production. CARDIAC: No chest pains or palpitations. GI: Positive nausea and vomiting. No abdominal pain. No change in his bowels. GENITOURINARY: No urinary symptoms. MUSCULOSKELETAL: He had generalized malaise and achiness but no focal pain. NEUROLOGIC: No focal weakness. SKIN: Positive redness, swelling over pain in the left buttock area. PSYCHIATRIC: No complaints. He was somewhat confused on admission, but this has improved. All other systems are negative.   PHYSICAL EXAMINATION:   VITAL SIGNS: T-max 98.6, T-current 97.9, pulse 73, blood pressure 116/75, and saturation 100% on room air.   GENERAL: [name] white man in no acute distress.   HEENT: Normocephalic, atraumatic. Pupils are equal and reactive to light. Extraocular motion intact. Sclerae, conjunctivae, and lids are without evidence for emboli or petechiae. Oropharynx shows and no erythema or exudate and gums are in poor condition.  NECK: Supple. Full range of motion. Midline trachea. No lymphadenopathy. No thyromegaly.   LUNGS: Clear to auscultation bilaterally with good air movement. No focal consolidation.   HEART: Regular rate and rhythm without murmur, rub, or gallop.   ABDOMEN: Soft, nontender, and nondistended. No hepatosplenomegaly. No hernia is noted.   EXTREMITIES: No evidence for tenosynovitis.   SKIN: He has multiple scars over both buttocks and lower back consistent with prior abscesses. There is an area on the left buttock that appears to have a fresh incision. The area was tender but there was no significant erythema. There was some firmness too but no fluctuance.   NEUROLOGIC: The patient was awake and interactive, moving all four extremities.   PSYCHIATRIC: Mood and affect appeared  normal.   LABS/RADIOLOGIC STUDIES: BUN 14, creatinine 1.25, bicarbonate 20, and anion gap 12. White count yesterday was 25.8 with a hemoglobin 13.7 and platelet count of 373 and ANC of 21.3. White count on admission was  22.8.  Wound culture from and incision and drainage performed in the emergency room is growing Staphylococcus aureus, sensitivities of which are pending.   One set of blood cultures is growing a coagulase negative staph in the aerobic bottle only. The other is growing a gram-positive rod in the aerobic bottle only.   Urinalysis had greater than 500 glucose, 2+ ketones 1+ blood, negative nitrites, negative leukocyte esterase, and no white cells were seen.   Chest x-ray from admission shows no infiltrates.   IMPRESSION: This is a 29 year old white man with a history of type 1 diabetes and recurrent DKA admitted with left buttock Staphylococcus aureus abscess, DKA and coagulase-negative staph and gram-positive rods in the blood cultures.   RECOMMENDATIONS:  1. He ran out of insulin and went into DKA. This may have been triggered by the abscess or his poor glucose control could have allowed the abscess to progress faster. His blood sugar is now under control and he is out of DKA.  2. He has a history of MRSA. We will continue vancomycin until the isolate is identified. We will likely be able to change him to p.o. therapy tomorrow.  3. We will place on contact isolation until the sensitivities return.  4. The abscess area is still tender but there is no fluctuance or significant erythema.  5. His blood cultures are growing gram-positive rods and coagulase-negative Staphylococcus. These are likely contaminants and do not require any treatment.   This is a moderately complex infectious disease consult. Thank you very much for involving me in Mr. Tawana ScaleWilson's care.  ____________________________ Rosalyn GessMichael E. Bradie Sangiovanni, MD meb:slb D: 08/21/2012 14:28:56 ET     T: 08/21/2012 15:19:13 ET        JOB#: [id number] cc: Rosalyn GessMichael E. Marynell Bies, MD, <Dictator> Doc E Bevan Disney MD ELECTRONICALLY SIGNED 08/22/2012 10:41

## 2015-02-08 NOTE — Op Note (Signed)
PATIENT NAME:  Mark [name], Mark [name] MR#:  [id number] DATE OF BIRTH:  1986-03-08  DATE OF PROCEDURE:  07/05/2012  PREOPERATIVE DIAGNOSIS:  Buttock abscesses times two.  POSTOPERATIVE DIAGNOSIS:  Buttock abscesses times two.  PROCEDURE PERFORMED: Bedside incision and drainage of left 1 x 2-cm buttock abscess and 1x1-cm buttock abscess.  ANESTHESIA: 50 mL of 1% lidocaine with epinephrine and 4 mg of morphine IV.    ESTIMATED BLOOD LOSS:  Minimal.  SPECIMENS: None.   COMPLICATIONS: None.   INDICATION FOR SURGERY: [name] is a pleasant and unfortunate 29 year old male who has a history of multiple buttock abscesses who comes in now with diabetic ketoacidosis. He complains of two areas on his buttocks which have been bothering him and thus having evaluated him I felt that his buttock abscesses needed drainage.   DETAILS OF PROCEDURE:  The patient was evaluated, informed consent was obtained. He was then laid supine on his ER table. Dry chucks were placed and he was given morphine. He abscesses were then prepped with Betadine. I then numbed the abscess cavity and the skin surrounding with 1% lidocaine with epinephrine. I then placed sterile gauze and incised into the cavities. There was small amount of purulence from each cavity. These were then irrigated and packed with 1% iodoform gauze. There were no immediate complications. Needle, sponge, and instrument counts were correct at the end of the procedure. I was present for the entirety of the procedure.    ____________________________ Si Raiderhristopher A. Annalyse Langlais, MD cal:bjt D:  07/05/2012 11:16:51 ET          T: 07/05/2012 12:08:18 ET          JOB#: [id number]  Cristal DeerHRISTOPHER A Janneth Krasner MD ELECTRONICALLY SIGNED 07/05/2012 15:02

## 2015-02-08 NOTE — Consult Note (Signed)
Impression: 29yo WM w/ h/o type I DM, recurrent DKA admitted with left buttock Staph aureus abscess, DKA and CNS and GPR in blood cultures.  He ran out of insulin and went into DKA.  This may have been triggered by the abscess or his poor glucose control could have allowed the abscess to progress faster.  His BS is now under control and he is out of DKA. He has a history of Methacillin Resistant Staph aureus.  Will continue vanco until the isolate is identified.  Will likely be able to change to po therapy tomorrow. Will place on contact isolation until the sensitivities return. The abscess area is still tender, but there is no fluctuance or significant erythema. His BCx are growing GPR and CNS.  These are likely contaminants and do not require treatment.  Electronic Signatures: Jaeven Wanzer, Rosalyn GessMichael E (MD)  (Signed on 31-Oct-13 14:21)  Authored  Last Updated: 31-Oct-13 14:21 by Ciara Kagan, Rosalyn GessMichael E (MD)

## 2015-02-08 NOTE — Discharge Summary (Signed)
PATIENT NAME:  Mark [name], Mark [name] MR#:  [id number] DATE OF BIRTH:  [birth year]  DATE OF ADMISSION:  07/05/2012 DATE OF DISCHARGE:  07/07/2012   CONSULTANTS:  1. Dr. Guss Bunde from Psychiatry  2. Dr. Juliann Pulse from Surgery    CHIEF COMPLAINT: Nausea, vomiting.   DISCHARGE DIAGNOSES:  1. Diabetic ketoacidosis secondary to medication noncompliance with poor diet compliance while in the hospital.   2. Leukocytosis from buttock abscess, status post I and D.  3. Hyponatremia.  4. Bipolar disorder, not on any medications.  5. History of MRSA.  6. History of nephrolithiasis, status post lithotripsy.  7. Chronic low back pain.   DISCHARGE MEDICATIONS:  1. Doxycycline 100 mg p.o. q.12 hours for nine days.  2. Tylenol 650 mg q.4 hours p.r.n. for pain.  3. Humulin 70/30 55 units sub-Q in the a.m. and 35 units sub-Q in the p.m.   DISPOSITION: Discharge home with home nurse for wound care.  TREATMENTS: Back out gauze wick awake approximately 0.5 cm per day and apply dressing changes daily.   CODE STATUS: The patient is a FULL CODE.   DIET: Diabetic low fat, low cholesterol without any concentrated sweets.   ACTIVITY: As tolerated.   FOLLOW-UP: Please follow-up with [facility] within a week.   DISCHARGE INSTRUCTIONS:  1. Please stop smoking.  2. Take your insulin.   3. Follow-up with your doctor as discussed multiple times.   HISTORY OF PRESENT ILLNESS: For full details of the history and physical, please see the dictation on 07/05/2012 by Dr. Judithann Sheen. Briefly, this is a 29 year old male with diabetes, medical noncompliance, hypertension, and history of bipolar disorder who presented with elevated blood sugars and was noted to be in DKA, dehydrated, having arthralgia and myalgia. He was admitted to CCU with DKA protocol and admitted to the hospitalist service.   SIGNIFICANT LABS AND IMAGING: Initial blood glucose was 575 with potassium of 5.2, sodium 129, anion gap 25. LFTs on September  15th albumin 3.2, AST 10, otherwise within normal limits. Initial WBC 14.6, peak WBC of 20, last WBC of 12.8 as of yesterday. Last sodium of 135. Last blood glucose of 117 this morning. Venous blood gas pH of 7.15 and pCO2 of 21.   HOSPITAL COURSE: The patient was admitted to the hospitalist service with DKA protocol and started on an insulin drip. The patient was also started on some IV fluids and did well and insulin drip was transitioned into his insulin 70/30. The patient has had multiple admissions this year for DKA. The patient appears to be noncompliant and stopped taking medications and provided multiple reasons for not taking them including cost, not able to follow with physician, inability to afford yet he does smoke cigarettes and can afford the cigarettes, as well as showed severe dietary indiscretion while he was in the hospital resorting to drinking sodas regularly even when he was counseled not to several times. The patient was also noted to have leukocytosis and found to have buttock abscesses and Surgery was consulted and the patient underwent incision and drainage. One of them appeared to be more of a sebaceous cyst, however, had a deep cavity. Currently iodoform gauze is applied and he will be discharged with home nurse for wound care. Tobacco cessation was strongly recommended again. His hyponatremia likely is from the elevated blood glucose and did trend lower with resolution of the DKA. At this point he will be discharged with a home nurse, counseled to take his medications, stop smoking, and  follow with [facility] which he has shown noncompliance with in the past.   DISPOSITION: Home with home health.   TOTAL TIME SPENT: 40 minutes.   CODE STATUS: The patient is FULL CODE.  ____________________________ Mark EatonShayiq Javeria Briski, MD sa:drc D: 07/07/2012 11:49:18 ET T: 07/07/2012 12:00:15 ET JOB#: [id number]  cc: Mark EatonShayiq Jc Veron, MD, <Dictator> [facility] Saverio Kader  MD ELECTRONICALLY SIGNED 07/11/2012 14:22

## 2015-02-08 NOTE — Discharge Summary (Signed)
PATIENT NAME:  Mark [name], Mark [name] MR#:  [id number] DATE OF BIRTH:  Oct 25, 1985  DATE OF ADMISSION:  09/06/2012 DATE OF DISCHARGE:  09/08/2012  DISPOSITION:  To [facility] whenever a bed is available. [facility]. We do not have neurosurgery here.   DISCHARGE DIAGNOSES:  1. Diabetic ketoacidosis with hyperkalemia, hyponatremia, and acute renal failure, resolved.  2. Periodontal disease.  3. Obstructive hydrocephalus.  4. Diabetes mellitus type 1.   MEDICATIONS:  1. Cipro 500 mg p.o. [name]. 8 hours.  2. Percocet 5/325 mg every six hours as needed for pain. 3. Cipro 500 mg p.o. b.i.d.  4. Novolin 70/30 29 units subq b.i.d.  [facility] Hampton AbbotBhowmik [facility].   LABORATORY, DIAGNOSTIC AND RADIOLOGICAL DATA: Blood glucose on admission  more than 600, troponin less than 0.02, TSH 4.83, which is slightly up. WBC 19.3, hemoglobin 14.7, hematocrit 47.2, platelets 528.  Electrolytes on admission: Sodium 126, potassium 6, chloride 88, bicarbonate 13. BUN  23, creatinine 1.55, glucose 839, anion gap of 25. The patient's chest x-ray showed no acute abnormality. Blood cultures have been negative. CT of the maxillofacial area showed dental disease, severe hydrocephalus, cervical adenopathy. CT of the head showed severe hydrocephalus, fourth ventricle not dilated, third ventricle not dilated. Cerebral aqueduct stenosis cannot be excluded.  Electrolytes 11/16: Sodium 134, potassium 4, chloride 101, bicarbonate 21, BUN 14, creatinine 0.96, glucose 182. WBC yesterday 11.6, hemoglobin 12.1, hematocrit 37.2, platelets 335.  CONSULTATIONS: None.     HOSPITAL COURSE: The patient is a 29 year old male with multiple medical admissions to this hospital for DKA.  He came in with chief complaint that he had diabetic ketoacidosis.  The patient was admitted in November for left buttock abscess and diabetic ketoacidosis  at that time. It was drained and he was discharged with doxycycline. Cultures from buttock showed methicillin-resistant Staphylococcus aureus. The patient finished doxycycline. The patient was not taking insulin because he ran out of insulin for the past three days, so he was admitted for diabetic ketoacidosis. The patient had diabetic ketoacidosis secondary to not taking insulin. The patient was started on insulin drip along with fluids. He also had poor dentition with pain in the right upper molar area. CT showed no abscess but dental caries. He was started on Cipro and Flagyl. The patient still has pain, getting Percocet, but WBC is down. Blood cultures have been negative.  The patient needs dental work-up.  [facility].  The patient has acute renal failure, hyponatremia, hyperkalemia due to diabetic ketoacidosis. All that resolved.   The patient had an incidental finding of hydrocephalus, but upon questioning him further he said he has been having a headache for a week. He also said that he has no blurred vision.  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 Ideally  would do the consultation but because of his poor compliance with no insurance issues and a relatively young age, [facility].  The patient would also benefit [facility]. The patient's condition is stable.  TIME SPENT ON DISCHARGE PREPARATION: More than 30 minutes.    ____________________________ Katha HammingSnehalatha Priscilla Finklea, MD sk:bjt D: 09/08/2012 14:54:06 ET T: 09/08/2012 15:35:56 ET JOB#: [id number]  cc: Katha HammingSnehalatha Jerrick Farve, MD, <Dictator> Katha HammingSNEHALATHA Ovida Delagarza MD ELECTRONICALLY SIGNED 10/07/2012 14:55

## 2015-02-08 NOTE — H&P (Signed)
PATIENT NAME:  Mark [name], Mark [name] MR#:  [id number] DATE OF BIRTH:  1986-04-12  DATE OF ADMISSION:  09/06/2012  PRIMARY CARE PHYSICIAN: None REFERRING PHYSICIAN: Dr. Enedina Finner  CHIEF COMPLAINT: "I think I have DKA."   HISTORY OF PRESENT ILLNESS: The patient is a 29 year old Caucasian male with past medical history of long-standing type 1 diabetes melitis, medication nonadherence, bipolar disorder, hyperlipidemia, tobacco abuse, hypertension, recurrent MRSA soft tissue skin infections, history of nephrolithiasis with history of lithotripsy, chronic back pain who presented to [facility] with DKA. He reports to me that he was recently discharged from [facility] on 08/22/2012. At that point in time he presented with DKA as well as a left buttock abscess. He was prescribed doxycycline upon discharge and reports that he completed the course of this. In addition, he reports that he recently ran out of his insulin 70/30. He reports that he had a recent funeral to go to in [place], [place] and he had a to pay a friend to be taken down to the funeral. After having paid to go down to the funeral he stated that he did not have money for his insulin 70/30. He reports that he has been out of his insulin 70/30 for the past three days. Upon presentation here his initial blood glucose was found to be 839. He had hyperkalemia with a serum potassium of 6.0. Serum bicarbonate was quite low at 13. Anion gap was quite high at 25. Urine showed 2+ ketones. These findings are consistent with DKA. Patient reports that his left buttock abscess healed quite well. This was confirmed on exam. When asked about any other potential infections patient reports that he has had some right facial swelling and has a number of dental caries. Patient was found to have point tenderness in the right maxillofacial area. He has quite high white count at 19,300. His pH was quite low at 7.14, pCO2 was also  low at 31. The patient was started on insulin drip and was receiving IV fluid hydration in the Emergency Department. We were called subsequently for admission.   PAST MEDICAL HISTORY:  1. Diabetes mellitus type 1.  2. Hyperlipidemia.  3. Tobacco abuse.  4. Hypertension.  5. History of recurrent MRSA soft tissue infections.  6. Nephrolithiasis status post lithotripsy in the past.  7. Chronic back pain.  8. Bipolar disorder, patient not on treatment.  9. Recent left buttock abscess status post incision and drainage, treated with doxycycline as an outpatient.   ALLERGIES: Augmentin XR, penicillin, and sulfa drugs.   MEDICATIONS: Medications from discharge on 08/22/2012 included:  1. Humulin 70/30, 55 units in the morning and 35 units at bedtime, which the patient has been out of for the past three days.  2. Percocet 10/325, 1 tablet p.o. every six hours p.r.n. which were given enough for three days.  3. Doxycycline 100 mg p.o. q.12 hours for 10 days which the patient states that he completed   SOCIAL HISTORY: Patient lives in [place]. He is single. He has no children. He reports that he is trying to apply for disability but has no income at this time. He has active tobacco abuse and smokes 3/4 of a pack of cigarettes per day. He denies alcohol or illicit drug use.   FAMILY HISTORY: Mother is alive and has history of breast cancer and also has history of coronary artery disease. He reports that his father is alive, however, he does not know his history.  REVIEW OF SYSTEMS: CONSTITUTIONAL: Patient denies fevers but reports he has had some chills. EYES: Denies diplopia, blurry vision. HENT: Reports right maxillofacial swelling. Denies epistaxis, sore throat. CARDIOVASCULAR: Denies chest pain. Does have some palpitations at present. RESPIRATORY: Denies cough, shortness of breath, hemoptysis. GASTROINTESTINAL: Has had some nausea and vomiting. Denies diarrhea. Has some lower abdominal pain.  GENITOURINARY: Denies dysuria. Has had some polyuria. MUSCULOSKELETAL: Has history of chronic low back pain. INTEGUMENTARY: Had a recent left buttock abscess, which is healing. NEUROLOGIC: Denies focal weakness. Does have numbness in his feet. PSYCHIATRIC: Has history of bipolar disorder. ENDOCRINE: Endorses polyuria and polydipsia. Has history of long-standing type 1 diabetes mellitus. HEMATOLOGIC/LYMPHATIC: Denies easy bruisability, bleeding, or swollen lymph nodes. ALLERGY/IMMUNOLOGIC: Denies seasonal allergies or history of immunodeficiency.   PHYSICAL EXAMINATION:  VITAL SIGNS: Temperature 98.2, pulse 133, respirations 20, blood pressure 136/86, pulse oximetry 96% on room air.   GENERAL: Disheveled appearing Caucasian male currently in no acute distress.   HEENT: Normocephalic, atraumatic. Extraocular movements are intact. Pupils are equal, round, reactive to light. No scleral icterus. Conjunctivae are pink. No epistaxis noted. Gross hearing intact. There is right maxillofacial swelling noted. There is tenderness to touch over the right maxillofacial region. In regards to his dentition he has quite poor dentition. He has a number of dental caries. There is obvious decay in the right upper canine region.   NECK: Supple and without JVD.   LUNGS: Clear to auscultation bilaterally with normal respiratory effort.   CARDIOVASCULAR: S1, S2. Patient noted to be tachycardic. No murmurs or rubs appreciated.   ABDOMEN: Soft. There is lower abdominal tenderness noted. No rebound or guarding. No gross organomegaly appreciated.   EXTREMITIES: No clubbing, cyanosis, or edema.   BUTTOCKS: The old left buttock abscess appears to be healing quite well. No other obvious buttock abscess at this time.   NEUROLOGICAL: Patient is alert, oriented to time, person, place. Strength is 5/5 in both upper and lower extremities.   GENITOURINARY: No suprapubic tenderness noted at this time.   MUSCULOSKELETAL: No joint  redness, swelling or tenderness appreciated.   PSYCHIATRIC: Patient with appropriate affect and appears to have some insight into his current illness.   LABORATORY, DIAGNOSTIC AND RADIOLOGICAL DATA: Sodium 126, potassium 6, chloride 88, CO2 13, BUN 23, creatinine 1.55, glucose 839. LFTs show total protein 9.2, albumin 4.7, total bilirubin 0.9, alkaline phosphatase 130, AST 16, ALT 15. Troponin less than 0.02. CBC shows WBC 19.3, hemoglobin 14.7, hematocrit 47, platelets 528. Urinalysis shows urine glucose greater than 500 mg/dL, no protein in the urine, no RBCs noted on urinalysis, no WBCs, specific gravity was noted as being 1.022, ketones were 2+. ABG shows pH 7.14, pCO2 31, however, this was a venous sample.  IMPRESSION AND PLAN: This is a 29 year old Caucasian male with past medical history of type 1 diabetes mellitus, bipolar disorder, hyperlipidemia, tobacco abuse, hypertension, history of recurrent MRSA soft tissue skin infections, nephrolithiasis status post lithotripsy, chronic back pain, recent left buttock abscess status post incision and drainage and subsequent outpatient treatment with doxycycline 100 mg p.o. b.i.d. x10 days who presents to [facility] with recurrent DKA. Patient ran out of his insulin three days ago.  1. Diabetic ketoacidosis. The patient reports to me that he ran out of his insulin three days ago. He did not have money to pay for this as he states that he was taken to a funeral in [place] and had to pay a friend to take him down. He  subsequently did not have money to pay for his insulin 70/30. Patient's blood sugar is quite high at 839 on metabolic profile. The patient will be started on DKA protocol and he will be admitted to the Critical Care Unit. He will require close monitoring of his electrolytes. Once his anion gap is closed he can be transitioned to subcutaneous insulin. Patient was again counseled on the importance of regularly taking his  insulin as prescribed.  2. Right maxillofacial swelling. The patient has a number of dental caries. He had point tenderness in the right maxillofacial region therefore we will obtain CT scan of the maxillofacial region without contrast given his acute renal failure. To treat any underlying infection we will start the patient on Cipro and Flagyl as the patient is unable to take penicillin or Augmentin given history of allergy. Depending on findings on the CT scan we may need to consider oral maxillofacial surgery consultation versus ENT consultation.  3. Acute renal failure most likely due to DKA. Will provide the patient with IV fluid hydration and will follow the patient's renal function. Obviously no indication for dialysis at this time.  4. Hyperkalemia likely induced by his underlying acidosis. This should improve with use of insulin drip. As above we will be following electrolytes closely.  5. Recent left buttock abscess status post incision and drainage. The patient had this as an issue during his last admission. The patient underwent left buttock abscess incision and drainage. I inspected this area and there was no obvious recurrent abscess or residual abscess in this area. This area, however, will need periodic observation.  6. CODE STATUS is FULL.  7. Deep vein thrombosis prophylaxis. Will provide the patient heparin 5000 units sub-Q q.8 hours.   TIME SPENT: One hour.   ____________________________ Lennox Pippins, MD mnl:cms D: 09/06/2012 17:05:34 ET T: 09/07/2012 07:49:48 ET  JOB#: [id number] cc: Lennox Pippins, MD, <Dictator> Ria Comment Makinlee Awwad MD ELECTRONICALLY SIGNED 10/04/2012 20:35

## 2015-02-08 NOTE — Consult Note (Signed)
Chief Complaint and History:   Referring Physician Dr Sherryll BurgerShah    Chief Complaint uncontrolled diabetes   Allergies:  Sulfa drugs: Rash  Augmentin XR: Itching, SOB, Hives  Penicillin: Swelling  Assessment/Plan:   Assessment/Plan Patinet was seen, examined, and chart reviewed. He is a chornically non-compliant type 1 diabetic admitted with DKA due to not taking his insulin. He claims he cannot afford insulin or cannot get transportation to go and buy it. Out patient DM regimen was 70/30 Mix 35 units at b-fast and 50 units at supper. Sugars improving on IV insulin.  A/ 1. DKA 2. Uncontrolled type 1 DM 3. Tobacco dependence  P/ Continue as you are with IV insulin and then transition per protocol to SQ insulin. Tomorrow I can assist with a change to 70/30 Mix, as we should try and assess his out-pt regimen and titrate thoses doses if need to give reasonable sugar control.  Counseled patient on the importance of taking medications to avoid DKA, diabetes complications including heart attack, stroke, coma, and death.  Full consult has been dictated.   Electronic Signatures: Raj JanusSolum, [name] M (MD)  (Signed 27-Aug-13 17:29)  Authored: Chief Complaint and History, ALLERGIES, Assessment/Plan   Last Updated: 27-Aug-13 17:29 by Raj JanusSolum, [name] M (MD)

## 2015-02-08 NOTE — H&P (Signed)
    Subjective/Chief Complaint Left buttock abscess s/p I and D in ER on 10/29, persistent pain, possible retained gauze    History of Present Illness Ms. Mark [name] is an unfortunate 29 yo diabetic male who has been admitted multiple times for diabetic ketoacidosis.  I have incised and drained multiple buttock abscesses on him in the past.  He presented to the ER with nausea/vomiting and elevated BG on 10/27.  He had [name] left buttock abscess drained at that time in the ER and he says that he believes that they packed gauze and he has not had it removed.  It is still tender to palpation and he would not tolerate removal at bedside earlier today.  Otherwise is doing better.    Past History Diabetes Bipolar disorder Hypercholesterolemia HTN H/o MRSA Recurrent soft tissue abscesses H/o kidney stones s/p lithotripsy    Past Medical Health Hypertension, Diabetes Mellitus, Smoking   Past Med/Surgical Hx:  IDDM:   Chronic Back Pain:   bipolar:   Hypercholesterolemia:   HTN:   MRSA: wound cullture  kidney stones:   Lithotripsy:   ALLERGIES:  Sulfa drugs: Rash  Augmentin XR: Itching, SOB, Hives  Penicillin: Swelling    Medications Tylenol Heparin Zofran Percocet Benzonatate Novolin SSI Prilosec Vancomycin   Family and Social History:   Family History Hypertension  Diabetes Mellitus    Social History positive  tobacco, positive ETOH, negative Illicit drugs, 0.5 ppd    Place of Living Home   Review of Systems:   Subjective/Chief Complaint Left buttock pain    Fever/Chills No    Cough No    Sputum No    Abdominal Pain No    Diarrhea No    Constipation No    Nausea/Vomiting No    SOB/DOE No    Chest Pain No    Dysuria No    Medications/Allergies Reviewed Medications/Allergies reviewed   Physical Exam:   GEN no acute distress    HEENT pink conjunctivae, PERRL    NECK No masses    RESP normal resp effort  no use of accessory muscles  wheezing  rhonchi   crackles    CARD regular rate  no murmur  no thrills  No LE edema    ABD denies tenderness  positive liver/spleen enlargement  no hernia  soft  normal BS  + approx 8 x 8 cm left buttock abscess with closed site of prior drainage, tender to palpation    LYMPH negative neck, negative axillae    EXTR negative cyanosis/clubbing, negative edema    SKIN No rashes, No ulcers, No overlying skin changes    NEURO cranial nerves intact, negative rigidity, negative tremor, follows commands    PSYCH [name]+O to time, place, person, good insight     Assessment/Admission Diagnosis [name] is [name] pleasant 29 yo M with [name] history of multiple abscesses s/p left buttock abscess removal. Still tender, not draining, concern for retained foreign body.  Will not tolerate exam and I and D at bedside    Plan To or in am for I and D of buttock abscess and removal of possible retained foreign body.   Electronic Signatures: Mark [name], Mark [name] (MD)  (Signed 31-Oct-13 22:59)  Authored: CHIEF COMPLAINT and HISTORY, PAST MEDICAL/SURGIAL HISTORY, ALLERGIES, OTHER MEDICATIONS, FAMILY AND SOCIAL HISTORY, REVIEW OF SYSTEMS, PHYSICAL EXAM, ASSESSMENT AND PLAN   Last Updated: 31-Oct-13 22:59 by Mark [name], Mark [name] (MD)

## 2015-02-08 NOTE — H&P (Signed)
PATIENT NAME:  Mark [name], Mark [name] MR#:  [id number] DATE OF BIRTH:  June 28, 1986  DATE OF ADMISSION:  07/05/2012  REFERRING PHYSICIAN: Dr. Zenda Alpers  FAMILY PHYSICIAN: Non-local  REASON FOR ADMISSION: Diabetic ketoacidosis.   HISTORY OF PRESENT ILLNESS: The patient is a 29 year old male with a history of type 2 diabetes on insulin and medical noncompliance.  He also has a history of hypertension and bipolar disorder. He has not been taking his insulin as directed.  He presents to the Emergency Room with a 2 to 3-day history of abdominal pain, nausea, vomiting, and elevated blood sugars, having diffuse arthralgias and myalgias. In the Emergency Room, the patient was noted to be dehydrated with hyperglycemia and an anion gap of 25 consistent with diabetic ketoacidosis. He is now admitted for further evaluation. He has been having nausea and vomiting.   PAST MEDICAL/SURGICAL HISTORY:  1. Bipolar disorder.  2. Type 2 diabetes, on insulin.  3. Chronic low back pain.  4. Hyperlipidemia.  5. Benign hypertension.  6. History of MRSA.  7. History of nephrolithiasis status post lithotripsy.   MEDICATIONS:  Humulin 70/30, 55 units subcutaneous q. a.m. and 35 units subcutaneous q. p.m.   ALLERGIES: Penicillin, sulfa, and Augmentin.   SOCIAL HISTORY: The patient smokes at least 1/2 pack per day. Denies alcohol abuse.   FAMILY HISTORY: Positive for diabetes and hypertension.   REVIEW OF SYSTEMS: CONSTITUTIONAL: No fever or change in weight. EYES: No blurred or double vision. No glaucoma. ENT: No tinnitus or hearing loss. No nasal discharge or bleeding. No difficulty swallowing. RESPIRATORY: No cough or wheezing. Denies hemoptysis. No painful respiration. CARDIOVASCULAR: No chest pain or orthopnea. No palpitations or syncope. GI: No diarrhea. No change in bowel habits. Denies rectal bleeding.  GU: No dysuria or hematuria. No incontinence. ENDOCRINE: The patient has had polyuria and polydipsia. No heat or cold  intolerance. HEMATOLOGIC: The patient denies anemia, easy bruising, or bleeding. LYMPHATIC: No swollen glands. MUSCULOSKELETAL: The patient has pain in his back and knees. Denies pain in his neck, shoulders, or hips. No gout. NEUROLOGIC: No numbness, although he does have weakness. Denies migraines, stroke, or seizures. PSYCH: The patient denies anxiety, insomnia, or depression.   PHYSICAL EXAMINATION:  GENERAL: The patient is in no acute distress.   VITAL SIGNS: Vital signs are remarkable for a blood pressure of 119/94 with a heart rate of 107 and respiratory rate of 22. He is afebrile.   HEENT: Normocephalic, atraumatic. Pupils equally round and reactive to light and accommodation. Extraocular movements are intact. Sclerae are anicteric. Conjunctivae are clear.  Oropharynx is dry but clear. Dentition is poor.   NECK: Supple without jugular venous distention or bruits. No adenopathy or thyromegaly is noted.   LUNGS: Clear to auscultation and percussion without wheezes, rales, or rhonchi. No dullness.   CARDIAC: Rapid rate with a regular rhythm. Normal S1 and S2. No significant rubs, murmurs, or gallops. PMI is nondisplaced. Chest wall is nontender.   ABDOMEN: Diffusely tender with no rebound or guarding. Normoactive bowel sounds. No organomegaly or masses were appreciated. No hernias or bruits were noted.   EXTREMITIES: Without clubbing, cyanosis, or edema. Pulses were 2+ bilaterally.   SKIN: Warm and dry without rash or lesions.   NEUROLOGIC: Cranial nerves II through XII grossly intact. Deep tendon reflexes were symmetric. Motor and sensory exams nonfocal.   PSYCH: The patient was alert and oriented to person, place, and time. He was cooperative and used good judgment.   LABORATORY, DIAGNOSTIC, AND RADIOLOGICAL  DATA: Urinalysis was unremarkable except for 2+ ketones. Glucose was 517 with a BUN of 19 a creatinine of 1.05 with a GFR of greater than 60. Sodium was 129 with a potassium of  5.2, and anion gap of 25. White count was 14.6 with a hemoglobin of 14.8.   ASSESSMENT:  1. Diabetic ketoacidosis.  2. Abdominal pain.  3. Nausea and vomiting.  4. Dehydration.  5. Hyponatremia.  6. Hyperkalemia.  7. Bipolar disorder.  8. Tachycardia.  9. Benign hypertension.   PLAN: The patient will be admitted to the Intensive Care Unit on IV fluids as well as an insulin drip. We will monitor his sugars hourly and adjust the drip as needed. We will use Zofran for nausea as needed. We will begin nicotine patch and empiric beta blocker therapy. We will consult psychiatry for his bipolar disorder. Clear liquid diet for now. Further treatment and evaluation will depend upon the patient's progress.   TOTAL TIME SPENT ON THIS PATIENT: 45 minutes.     ____________________________ Duane LopeJeffrey D. Judithann SheenSparks, MD jds:bjt D: 07/05/2012 01:06:54 ET T: 07/05/2012 07:58:52 ET JOB#: [id number]  cc: Duane LopeJeffrey D. Judithann SheenSparks, MD, <Dictator> Aidan Moten Rodena Medin Zayveon Raschke MD ELECTRONICALLY SIGNED 07/07/2012 20:33

## 2015-02-08 NOTE — Consult Note (Signed)
PATIENT NAME:  Mark [name], SHARTZER MR#:  [id number] DATE OF BIRTH:  03/23/1986  DATE OF CONSULTATION:  06/17/2012  REFERRING PHYSICIAN:  Delfino Lovett, MD  CONSULTING PHYSICIAN:  A. Wendall Mola, MD  CHIEF COMPLAINT: Diabetic ketoacidosis.   HISTORY OF PRESENT ILLNESS: This is a 29 year old male with a history of type I diabetes admitted today with diabetic ketoacidosis. His presenting blood sugar was 927, bicarb 18, anion gap 21, with 2+ urinary ketones. Additionally, he had an elevated creatinine of 1.55 and an elevated white count of 18.4. He was treated with IV insulin and IV fluids and blood sugars have been improving and most recently sugar was 353. He is on a regular diet. He initially had nausea and vomiting but those issues have resolved. He has had a history of recurrent diabetic ketoacidosis typically attributed to noncompliance with his insulin regimen. He claims his outpatient regimen is mix of 70/30 and he takes a varying dose, typically 35 units with breakfast and 50 units with supper, but sometimes more or less. He attributes noncompliance due to difficulty with transportation as well as difficulty with finances and purchasing the insulin. He purchases it over-the-counter. He has not ever had outpatient diabetes follow-up. He tells me he was enrolled at the [facility] and had several appointments scheduled but was never able to make those appointments. Currently he complains of mouth pain. He states his dentition is very poor and he needs some dental work. He also complains of blurred vision. He has had polyuria and increased thirst. Polyuria seems to be improving as blood sugars come down. He believes his weight is down a bit, average weight is 230 and currently we have him at 160 which is a weight loss of 70 pounds. He complains of headache. He denies chest pain.   PAST MEDICAL HISTORY:  1. Type I diabetes.  2. Bipolar disorder.  3. Hypertension.  4. Hyperlipidemia.   SOCIAL  HISTORY: The patient lives with his mother. She is disabled and has a history of breast cancer. He denies alcohol use. He smokes about 1 pack per day of cigarettes. He rarely uses alcohol.   FAMILY HISTORY: Positive for diabetes in several aunts and uncles. Mother had breast cancer.   REVIEW OF SYSTEMS: GENERAL: He has had weight loss. No fevers. HEENT: He has had blurred vision. He has oral pain. NECK: Denies neck pain or dysphagia. CARDIAC: Denies chest pain or palpitations. PULMONARY: Denies cough or shortness of breath. ABDOMEN: He had nausea and vomiting, this is improved. Appetite is fair. EXTREMITIES: He denies lower extremity swelling. SKIN: He denies rash or pruritus. NEUROLOGICAL: He denies tremor or falls or headache. HEME: Denies easy bruisability or recent bleeding. ENDOCRINE: Denies heat or cold intolerance.   PHYSICAL EXAMINATION:   VITAL SIGNS: Height 71 inches, weight 160 pounds, BMI 21.1, temperature 98.2, pulse 118, respirations 15, blood pressure 148/75, pulse oximetry 96%.   GENERAL: This is a thin white male in no acute distress.   HEENT: Extraocular movements are intact. Oropharynx is clear. Dentition is poor.   NECK: Supple. No thyroid bruit.   CARDIAC: Tachycardic without murmur.   PULMONARY: Clear to auscultation bilaterally. No wheeze.   ABDOMEN: Diffusely soft, nontender, nondistended.   EXTREMITIES: No edema is present.   NEUROLOGIC: No sensory deficits are present. No tremors present.   SKIN: No rash or dermatopathy is present.   PSYCHIATRIC: Alert and oriented x3, calm and cooperative.   LABORATORY, DIAGNOSTIC, AND RADIOLOGICAL DATA: Labs today at  3 p.m. blood glucose 255, BUN 19, creatinine 1.56, sodium 136, potassium 3.7, bicarb 19. 03/15/2012 hemoglobin A1c 10.4%.   ASSESSMENT:  1. Diabetic ketoacidosis.  2. Longstanding uncontrolled diabetes.  3. Medication noncompliance.  4. Tobacco dependence.   RECOMMENDATIONS:  1. Agree with current plan  of treating with IV insulin and then transition to sub-Q insulin once stable. It would be reasonable to start with a regimen of 70/30 mix 35 units in the morning and 50 units at supper rather than transition to basal-bolus therapy. Instead we could titrate his mix insulin to the best of our ability to get him started once he leaves here as he likely will continue this outside the hospital at least for a short while.  2. Counseled him on the importance of compliance. Recommended regular outpatient follow-up. Counseled that he will have lifelong diabetes and this is a typically outpatient managed disease and typically well managed with regular use of medications and clinic follow-up.  3. Recommended tobacco cessation.   I will follow along with you.   ____________________________ A. Wendall MolaMelissa Equan Cogbill, MD ams:drc D: 06/17/2012 17:25:11 ET T: 06/18/2012 09:44:13 ET JOB#: [id number]  cc: A. Wendall MolaMelissa Sharlie Shreffler, MD, <Dictator> Macy MisA. MELISSA Loreley Schwall MD ELECTRONICALLY SIGNED 06/21/2012 11:11

## 2015-02-08 NOTE — Consult Note (Signed)
PATIENT NAME:  Mark [name], Mark [name] DATE OF BIRTH:  July 17, 1986  DATE OF CONSULTATION:  07/05/2012  REFERRING PHYSICIAN:   CONSULTING PHYSICIAN:  Cristal Deerhristopher A. Kathaleen Dudziak, MD  REASON FOR ADMISSION: Recurrent buttock abscesses.  HISTORY OF PRESENT ILLNESS: [name] is a pleasant 29 year old male who has a history of type 2 diabetes with medical noncompliance. He presents with abdominal pain, nausea, vomiting, and blood sugars in the 500s and a large anion gap. He was admitted for resuscitation and for blood sugar control. I was being asked to evaluate his buttock abscesses. According to him he had history of multiple axillary and buttock abscesses. He says that he had one on his bottom that expressed purulence earlier and has another one on his opposite side of buttocks which is tender and appears to be ready to rupture as well. Otherwise, no fevers, chills, night sweats, shortness of breath, cough, current abdominal pain, nausea, vomiting or dysuria, hematuria.   PAST MEDICAL HISTORY:  1. Bipolar.  2. Type 2 diabetes on insulin.  3. History of recurrent axillary and buttock abscesses.   4. Chronic low back pain.  5. Hyperlipidemia.  6. Hypertension.  7. MRSA.  8. History of nephrolithiasis.   HOME MEDICATIONS: Humulin 70/30.   ALLERGIES: Penicillin, sulfa, and Augmentin.   SOCIAL HISTORY: Half pack a day smoker. Denies alcohol abuse.   FAMILY HISTORY: Diabetes and hypertension.   REVIEW OF SYSTEMS: As above, a total 12 point review of systems was obtained. Pertinent positives and negatives are above.   PHYSICAL EXAMINATION:  VITAL SIGNS: Temperature 98.1, pulse 96, blood pressure 120/75, respirations 22, pulse oximetry 99.   GENERAL: No acute distress, alert and oriented x3.   HEAD: Normocephalic, atraumatic.   EYES: No scleral icterus. No conjunctivitis.   FACE: Normal external nose, normal external ears.   NECK: No obvious masses visualized.   CHEST: Lungs  clear to auscultation, moving air well.   HEART: Regular rate and rhythm. No murmurs rubs or gallops  ABDOMEN: Soft, nontender, nondistended.   EXTREMITIES: Moves all extremities well, strength 5/5 all 4 extremities  BUTTOCKS: There is approximately at his upper left gluteal cleft a 1 x 2 cm erythematous area with a punctum at the tip which appears to have been draining. He also has a fluctuant nodule on his right buttocks more caudate to the previous which appears to have a thin covering which is about ready to rupture. The left is more tender than the right but the right is slightly tender. There are no obvious other lesions.    LABORATORY, RADIOLOGICAL AND DIAGNOSTIC DATA: Labs are significant this morning for a white cell count of 20, a creatinine of 1.16, blood sugars are better controlled at 133.   ASSESSMENT AND PLAN: [name] is an unfortunate 29 year old admitted for diabetic ketoacidosis who appears to have bilateral buttock abscesses. I have incised and drained these. See attached procedure note. There was minimal purulence but there was a sizable cavity and the right one appeared to essentially be an infected sebaceous cyst because it did appear that sebum had come out. I have packed these with iodoform gauze. I will slowly remove the gauze over the next few days. Please keep these dry. Please call if any questions.  I appreciate the opportunity to consult on this interesting patient.   ____________________________ Si Raiderhristopher A. Raquan Iannone, MD cal:cms D: 07/05/2012 11:22:11 ET T: 07/05/2012 12:10:30 ET JOB#: [id number]  cc: Cristal Deerhristopher A. Luba Matzen, MD, <Dictator> Jarvis NewcomerHRISTOPHER A Shealyn Sean MD  ELECTRONICALLY SIGNED 07/05/2012 15:01

## 2015-02-08 NOTE — H&P (Signed)
PATIENT NAME:  Mark [name], Mark [name] MR#:  [id number] DATE OF BIRTH:  [birth year]  DATE OF ADMISSION:  08/19/2012  PRIMARY CARE PHYSICIAN: None  CHIEF COMPLAINT: Nausea, vomiting, not feeling well.   HISTORY OF PRESENT ILLNESS: Mark [name] is a 29 year old Caucasian gentleman with history of type 1 diabetes who has history of diabetes requiring insulin and significant history of medical noncompliance. He has history of hypertension and bipolar disorder. Patient was recently discharged 09/16 with similar symptoms when he came in with ketoacidosis and comes in today with nausea, vomiting and elevated sugars more than 600. He was found again to be in diabetic ketoacidosis. Patient is being admitted with diabetic ketoacidosis. He has not been able to take his insulin for the last 4 to 5 days since he ran out of it. He is being admitted for further evaluation and management. Patient is on IV fluids. He is going to be started on insulin drip. Next, in the Emergency Room patient was found to have elevated white count of 22,000. He has history of multiple buttock abscesses and recurrence in the past; the most recent one underwent incision and drainage by Dr. Rexene Edison on 07/05/2012. He was at that time discharged on 10 days treatment of doxycycline. Patient has history of MRSA listed for 2009. Currently he has an abscess on his left buttock which is going to be incised by ER physician. Patient denies any fever.   PAST MEDICAL HISTORY: 1. Type 1 diabetes.  2. Bipolar disorder. Patient not on any medication.  3. Hypercholesterolemia.  4. Tobacco abuse.  5. Hypertension.  6. History of MRSA infection September 2009.   7. History of kidney stones status post lithotripsy in the past.  8. Chronic back pain.   MEDICATIONS: He is supposed to be on insulin Humulin 70/30, 55 units in the morning and 35 units in the evening.   ALLERGIES: Augmentin XR, penicillin and sulfa.   SOCIAL HISTORY: Smokes about 1/2 pack a  day. Denies alcohol use or any other drug use.   FAMILY HISTORY: Positive for hypertension and diabetes.   REVIEW OF SYSTEMS: CONSTITUTIONAL: No fever. Positive for fatigue and weakness. EYES: No blurred or double vision or glaucoma. ENT: No tinnitus, ear pain, hearing loss. RESPIRATORY: No cough, wheeze, hemoptysis, or dyspnea. CARDIOVASCULAR: No chest pain, orthopnea, edema. GASTROINTESTINAL: Positive for nausea, vomiting. No history of diarrhea, abdominal pain, gastroesophageal reflux disease or rectal bleeding. GENITOURINARY: No dysuria, hematuria. ENDOCRINE: No polyuria or nocturia. HEMATOLOGY: No anemia or easy bruising. SKIN: Positive for multiple abscesses over the buttock. There is about a 3 to 4 cm abscess on the left buttock. NEUROLOGICAL: No cerebrovascular accident, transient ischemic attack. PSYCH: Positive for bipolar disorder. All other systems reviewed and negative.   PHYSICAL EXAMINATION:  GENERAL: Patient is awake, alert, oriented x3. He is agitated, verbally abusive and in mild to moderate distress secondary to vomiting.   VITAL SIGNS: He is afebrile, pulse 122, blood pressure 147/94, sats 93% on room air.    HEENT: Atraumatic, normocephalic. Pupils are equal, round, and reactive to light and accommodation. Extraocular movements intact. Oral mucosa is dry.   NECK: Supple. No JVD. No carotid bruit.   RESPIRATORY: Clear to auscultation bilaterally. No rales, rhonchi, respiratory distress, or labored breathing.   CARDIOVASCULAR: Both the heart sounds are normal. Tachycardia present. No murmur heard. PMI not lateralized. Chest nontender.   EXTREMITIES: Good pedal pulses, good femoral pulses. No lower extremity edema.   ABDOMEN: Soft, benign, nontender. No organomegaly. Positive bowel  sounds.   NEUROLOGIC: Grossly intact cranial nerves II through XII. No motor or sensory deficit.   SKIN: Patient does about a 3 to 4 cm abscess on the left buttock and he has got some  hyperpigmented scarring on the buttocks bilaterally due to old abscesses in the past.   PSYCH: Patient is awake. He is alert, oriented x3. He is somewhat irritable and verbally abusive.   LABORATORY, DIAGNOSTIC AND RADIOLOGICAL DATA: Urinalysis positive for 2+ ketones, more than 500 glucose. pH venous 7.12, pCO2 22. WBC count 22,000, glucose 750, sodium 128, potassium 5.3, bicarbonate 8. Anion gap 29. Creatinine 1.44   ASSESSMENT AND PLAN: 29 year old Mark [name] with history of type 1 diabetes, bipolar disorder, hypertension who has significant history of medical noncompliance comes in with:  1. Acute recurrent diabetic ketoacidosis due to noncompliance, non affordability of the insulin. Patient has not taken insulin for four to five days. Will admit patient to Intensive Care Unit. Keep patient n.p.o. except medications. Continue IV fluids for hydration. Patient is already started on insulin drip, will continue it at this time. Check metabolic panel after four to five hours and one in the morning and adjust fluids accordingly.  2. Pseudohyponatremia due to uncontrolled sugars. Will continue IV fluids and insulin drip.  3. Hyperkalemia in the setting of diabetic ketoacidosis. Will continue to monitor MET B closely.  4. Acute renal failure due to nausea, vomiting in the setting of DKA. Will continue IV hydration aggressively.  5. Systemic inflammatory response syndrome due to recurrent buttock abscesses. Patient presents with tachycardia, leukocytosis, has one large on the left buttock which will be incised and drained by ER M.D. Will send aerobic, anaerobic cultures. Send blood cultures I will start patient on vancomycin given his recurrence of buttock abscesses. Most treatment is September 2013 was by doxycycline. He has had MRSA positive in 2009.   6. Tobacco abuse. Patient refused nicotine patch, continues to smoke. Smoking cessation advised about three minutes spent. Patient does not appear to be  motivated.  7. History of bipolar disorder, not on any medications.   8. Deep vein thrombosis prophylaxis with heparin.  9. Further work-up according to patient's clinical course. Hospital admission plan was discussed with patient. No family members were present in the Emergency Room.   CRITICAL TIME SPENT: 50 minutes.  ____________________________ Hart Rochester Posey Pronto, MD sap:cms D: 08/19/2012 20:21:27 ET T: 08/20/2012 05:54:37 ET  JOB#: [id number] Tia Gelb A Taronda Comacho MD ELECTRONICALLY SIGNED 08/23/2012 12:51

## 2015-02-08 NOTE — Consult Note (Signed)
PATIENT NAME:  Mark [name], Mark [name] MR#:  [id number] DATE OF BIRTH:  [birth year]  DATE OF CONSULTATION:  07/05/2012  REFERRING PHYSICIAN:   CONSULTING PHYSICIAN:  Alesandra Smart K. 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 Go find out."  The patient had worked for them one and a half years and they let him go.  The patient is single, never married, and lives with his mother who is 3 years old.  The patient comes to [facility] Emergency Room with a chief complaint of "diabetes problems".  HISTORY OF PRESENT ILLNESS:  The patient reports that he started having vomiting, unable to keep anything down, and he knew that he had leg pains in relation to the same and he has not been taking insulin as recommended for his diabetes and so he called the ambulance and they brought him for admission.  The patient reports that he is not able to afford the medications and so he has not been taking his medications and has been noncompliant.  He knew that he has complications from the same.    PAST PSYCHIATRIC HISTORY:  History of inpatient hospitalization on psychiatry twice before for depression.  First inpatient hospitalization on psychiatry was at age 58 years for one month for depression.  Second inpatient hospitalization on psychiatry was for six weeks at [facility] when he was 29 years old for depression.  History of suicide attempt once when he slashed his wrists. He reports that he was followed by various doctors and psychiatrists at various mental health clinics and was last seen several years ago and was on several medications which include Prozac, Celexa, and Seroquel. None of the medications that they prescribed were helpful.  He reports that consultation, ventilating feelings, and therapy helped but he is not able to  afford to go for the same.    ALCOHOL AND DRUGS:  Denied.  MENTAL STATUS EXAMINATION:  The patient is dressed in hospital clothes.  Alert and oriented to place, person, and time, fully aware of the situation that brought him for admission to [facility].  Affect is appropriate with his mood, which is low and down and depressed.  He admits that he does feel depressed and feels low and down.  Admits feeling hopeless and helpless about his situation and life in general.  He admits feeling worthless and useless about his life in general.  He denies any suicidal or homicidal ideas or plans and wants to get help.  No evidence of psychosis.  Denies auditory or visual hallucinations.  Denies hearing voices or seeing things.  Does admit feeling paranoid about people in general and people around.  Denies thought insertion or thought control.  Denies having any grandiose ideas.  Memory is intact.  Recall is good.  He could spell the word "world" forward and backward without any problems.  He could name the capital of [place], the capital of the [place], name the current president and previous few presidents.  Abstract interpretation is good. Does admit feeling low and down, admits that ventilation of feelings helps but he is not able to afford the same.  Insight and judgment guarded.  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  Occupational, financial problems leading  to depression.  Recommend no medications at this time as the patient refuses to take any antidepressants as they have not helped him in the past.  The patient wishes to get counseling to help coping skills and also help ventilate his feelings so that he can talk to somebody.  The patient reported, "All I want to do is talk to somebody."   Recommend that the patient go to counseling for diabetes and issues related to the same after discharge from the  hospital at the local mental health clinic or local diabetes clinic.  Social services is to be contacted to find out resources in the community where he can go for therapy on a regular basis where he can get support and coping skills in dealing with diabetes and complications and issues related to the same.   ____________________________ Jannet MantisSurya K. Guss Bundehalla, MD skc:bjt D: 07/05/2012 18:11:02 ET T: 07/06/2012 08:04:12 ET JOB#: [id number]  cc: Monika SalkSurya K. Guss Bundehalla, MD, <Dictator> Beau FannySURYA K Nyliah Nierenberg MD ELECTRONICALLY SIGNED 07/06/2012 18:14

## 2015-02-08 NOTE — Discharge Summary (Signed)
PATIENT NAME:  Mark [name], Larenzo Q MR#:  [id number] DATE OF BIRTH:  26-Jan-1986  DATE OF ADMISSION:  08/19/2012 DATE OF DISCHARGE:  08/22/2012  PRIMARY CARE PHYSICIAN: None local.  CONSULTANTS: Tiney Rougealph Ely, MD  DISCHARGE DIAGNOSES:  1. Diabetic ketoacidosis. 2. Uncontrolled diabetes. 3. Left buttock abscess.  4. Leukocytosis possibly due to reaction.  5. Acute renal failure.   PROCEDURE: Left buttock abscess incision and drainage.   CONDITION: Stable.   CODE STATUS: FULL CODE.   HOME MEDICATIONS:  1. Humulin 70/30 subcutaneous 55 units in the morning and 35 units at bedtime. 2. Percocet 325 mg/10 mg p.o. tablets 1 tablet every six hours p.r.n. for three days.  3. Doxycycline 100 mg p.o. every 12 hours for 10 days.   DIET: ADA diet.   ACTIVITY: As tolerated.   FOLLOW-UP CARE: Follow-up with PCP within 1 to 2 weeks. Follow-up with Dr. Michela PitcherEly within one week. The patient also needs wound care nurse to teach wound care dressing.   REASON FOR ADMISSION: Nausea and vomiting, not feeling well.  HOSPITAL COURSE: The patient is a 29 year old Caucasian male with history of diabetes type-1, significant history of medical noncompliance, hypertension, and bipolar disorder who presented to the ED with nausea and  vomiting. The patient was noted to have blood sugar more than 600 and again in diabetic ketoacidosis status. The patient has not been able to take his insulin for the last 4 to 5 days before admission since he ran out of it. The patient has history for multiple buttock abscesses and recurrence in the past and history of MRSA. Currently the patient has an abscess on his left buttock and got incision and drainage in the ED. For detailed history and physical examination, please refer to the admission note dictated by Dr. Enedina FinnerSona Patel.   1. The patient was admitted to the Critical Care Unit for DKA and buttock abscess. The patient's white count was 22,000, glucose 750, sodium 128, potassium 5.3,  bicarbonate 8, anion gap 29, and creatinine 1.44. Urinalysis showed 2 plus ketones, pH 7.12, and pCO2 22. The patient was treated with insulin drip, NPO, and IV fluids in the Critical Care Unit. Blood sugar became under-controlled so the patient was converted to Novolin 70/30 50 units before breakfast and 35 units before supper. The insulin drip was discontinued. The patient's sugar was controlled after converting to Novolin 70/30. However, since the patient did not feel well, he did not eat his meal and developed hypoglycemia yesterday. Blood sugar decreased to about 40s. He was treated with D50 and a snack. Blood sugar became normal. The patient's blood sugar today is 146.  2. Buttock abscess, SIRS, and bacteremia. The patient got incision and drainage in the ED. However, the patient still feels something inside the buttock abscess area so wound care nurse changed the dressing for the patient and she is not sure if there is dressing inside so Dr. Michela PitcherEly evaluated the patient who did incision and drainage again and did not find any packing inside. The patient's blood culture is positive for MRSA, so Dr. Leavy CellaBlocker suggested to continue vancomycin and change to doxycycline today and then continue doxycycline for 10 days. The patient's white count decreased to 7.4 today.  3. Acute renal failure. The patient has been treated with IV fluid support. Renal function became normal.           The patient is clinically stable and will be discharged to home today. I discussed the patient's discharge plan with the  patient, the case manager, and the nurse. I advised the patient to be compliant with medication. We will give Humulin, doxycycline, and Percocet prescriptions.   TIME SPENT: About 42 minutes.  ____________________________ Shaune Pollack, MD qc:slb D: 08/22/2012 16:02:18 ET T: 08/23/2012 11:34:43 ET JOB#: [id number]  cc: Shaune Pollack, MD, <Dictator> Carmie End, MD Shaune Pollack MD ELECTRONICALLY SIGNED  08/27/2012 16:55

## 2015-02-11 NOTE — Discharge Summary (Signed)
PATIENT NAME:  Mark [name], Mark [name] MR#:  [id number] DATE OF BIRTH:  03-09-1986  DATE OF ADMISSION:  10/08/2013  DATE OF DISCHARGE:  10/09/2013  ADMISSION DIAGNOSES: Diabetic ketoacidosis.  DISCHARGE DIAGNOSES:  1.  Diabetic ketoacidosis.   2.  Acute bronchitis.  3.  Tobacco dependence.   CONSULTATIONS: None.   LABORATORIES AT DISCHARGE: White blood cells 10, hemoglobin 12.8, hematocrit 37.5, platelets 308. Sodium 135, potassium 3.6, chloride 106, bicarb 24, BUN 9, creatinine 0.95, glucose is 112, calcium 8.7. Bilirubin 0.5, alk phos 97, ALT 12, AST 15, total protein 6.5, albumin 3.2. Troponins negative.   HOSPITAL COURSE: [name] male who presented in DKA, with nausea and vomiting. For further details, please refer to the H and P.   1.  Diabetic ketoacidosis. The patient was admitted to ICU, started on insulin drip. His BMP was checked, as per DKA protocol, he was weaned off the insulin drip. His sugars have improved. The patient will be arranged for outpatient diabetes education as well as Dr. Gabriel Carina for further evaluation of his medications and diabetic needs.   2.  Sinus tachycardia. Improved with IV fluids.   3.  Smoker.  The patient does not want a patch at discharge. He does not want to quit.   4.  Acute bronchitis. The patient will continue Levaquin for 3 more days.   5.  Leukocytosis. Improved; this is likely secondary to stress DKA.   DISCHARGE MEDICATIONS: 1.  Insulin 70/30, 65 units in the morning, 45 at bedtime.  2.  Levaquin 500 mg q. 24 hours x 3 days.   INSTRUCTIONS: Discharge diet:  ADA diet. Discharge activity as tolerated. Discharge referral: Diabetes education and Dr. Gabriel Carina.    TIME SPENT: Approximately 35 minutes.   The patient is medically stable for discharge.    ____________________________ Christon Parada P. Benjie Karvonen, MD spm:mr D: 10/09/2013 14:21:08 ET T: 10/09/2013 22:53:03 ET JOB#: [id number]  cc: Natayah Warmack P. Benjie Karvonen, MD, <Dictator> A. Lavone Orn, MD  Jaxxson Cavanah P  Oluwatomiwa Kinyon MD ELECTRONICALLY SIGNED 10/15/2013 2:01

## 2015-02-11 NOTE — Consult Note (Signed)
PATIENT NAME:  Mark [name], Mark [name] MR#:  [id number] DATE OF BIRTH:  [birth year]  DATE OF CONSULTATION:  10/15/2013  REFERRING PHYSICIAN:  Sharman Cheek, MD CONSULTING PHYSICIAN:  Ardeen Fillers. Garnetta Buddy, MD  REASON FOR CONSULTATION:  "I wasn't meant to be here."   HISTORY OF PRESENT ILLNESS:  The patient is a 29 year old single Caucasian male who was discharged from the medical floor on 10/13/2013, and he presented to the ED after being discharged and was waiting for the right home, but then no ride showed up, he came back to the waiting area and stated that he is having suicidal thoughts. He does not have a plan. He stated that he blurted out to get attention and was not meant to be here. He reported that he wants to now go back home to take care of his elderly mother, who is 71 years old and in a wheelchair with lung cancer. During my evaluation, the patient was noted to be lying in the bed. He reported that he does not want to be in the hospital as he was waiting for the ride and since the ride did not show up, he wants to go back home. He reported that he does not have any history of mental illness and does not feel depressed, anxious and does not have any mood or anxiety symptoms. He reported that he wants to go home and wants to take care of his mother. Reported that his mother lives by herself at this time. He is feeling sorry and is excited about going home. The patient stated that he is applying for disability and is waiting for the same. He currently denied having any suicidal or homicidal ideations or plans.   PAST PSYCHIATRIC HISTORY:  The patient reported that he does not take any psychotropic medications. He has never attempted suicide and has never been in a psychiatric hospital.   PAST MEDICAL HISTORY: 1.  Type 1 diabetes.  2.  Hypertension.  3.  Hyperlipidemia.   FAMILY HISTORY:  Positive for diabetes in several family members of the family.   SOCIAL HISTORY:  The patient currently lives  with his mother. He has been smoking since the early teenage years. He denies any use of alcohol but has been drinking one or twice a month.   PAST SURGICAL HISTORY:  None.   ALLERGIES:  AUGMENTIN, PENICILLIN AND SULFA DRUGS.   HOME MEDICATIONS:  Insulin 70/30, 65 units in the morning and 45 units at night. He buys over-the-counter insulin at [employer].   PHYSICAL EXAMINATION:  VITAL SIGNS:  Temperature 96, pulse 81, respirations 16, blood pressure 113/66.  LABORATORY, DIAGNOSTIC, AND RADIOLOGICAL DATA:   Glucose 281, BUN 6, creatinine 0.82, sodium 134, potassium 3.6, chloride 99, bicarbonate 27, anion gap 8, phosphorous 9.0, magnesium 1.8, hemoglobin A1c 9.4. UDS is positive for methadone. WBC 6.5, RBC 4.20, platelet count is 215, MCV 96, RDW is 12.7.   MENTAL STATUS EXAMINATION:  The patient is a disheveled-appearing male who was lying in the bed. He appears somewhat tired as his blood sugar is poorly controlled. He maintained fair eye contact. His mood was fine. Affect was congruent. Thought process was logical, goal-directed. Thought content was nondelusional. He currently denied having any suicidal or homicidal ideations or plans. He denied having any perceptual disturbances. He reported that he is happy that he will be discharged back home. He is currently living with his mother. He contracted for safety.   DIAGNOSTIC IMPRESSION:  AXIS I: Mood disorder, not otherwise  specified.  AXIS II: None.  AXIS III: Please review the medical history.   TREATMENT PLAN:  I discussed the patient at length about the discharge plan and advised him that the patient can be discharged home if he will call a ride who can take him back home as he does not have a ride to go back home. He demonstrated understanding. The patient will not be allowed to walk back home at this time due to his history of diabetic ketoacidosis and high-risk of having ulcers in his feet. The patient is unhappy with this decision, but he  will not be allowed to walk back home in this cold weather. He will wait for the ride in the ED. I will discontinue the involuntary commitment at this time.   Thank you for allowing me to participate in the care of this patient.   ____________________________ Ardeen FillersUzma S. Garnetta BuddyFaheem, MD usf:jm D: 10/15/2013 11:59:00 ET T: 10/15/2013 13:12:23 ET JOB#: [id number]  cc: Ardeen FillersUzma S. Garnetta BuddyFaheem, MD, <Dictator> Rhunette CroftUZMA S Shannon Balthazar MD ELECTRONICALLY SIGNED 10/19/2013 9:31

## 2015-02-11 NOTE — Discharge Summary (Signed)
PATIENT NAME:  Mark [name], Mark [name] MR#:  [id number] DATE OF BIRTH:  [birth year]  DICTATION ENDS HERE  ____________________________ Shaune PollackQing Jaanvi Fizer, MD qc:np D: 07/10/2013 15:47:00 ET T: 07/10/2013 17:06:05 ET JOB#: [id number]  cc: Shaune PollackQing Jaquell Seddon, MD, <Dictator> Shaune PollackQING Johanna Stafford MD ELECTRONICALLY SIGNED 07/10/2013 17:26

## 2015-02-11 NOTE — H&P (Signed)
PATIENT NAME:  Mark [name], Draper Q MR#:  [id number] DATE OF BIRTH:  14-Oct-1986  DATE OF ADMISSION:  05/22/2013  PRIMARY CARE PHYSICIAN:  None.  He is trying to get on Medicaid.   CHIEF COMPLAINT: Nausea, vomiting.   HISTORY OF PRESENT ILLNESS: This is a 29 year old man with multiple admissions in the past for diabetic ketoacidosis secondary to noncompliance with insulin. The past 3 days, he has not taken his insulin. He did get it this morning and took a shot, but he was still nauseous and vomiting since 8:00 a.m. This morning he also had some blood tinge in the vomit. He does have some cramping abdominal pain and some diarrhea. In the ER, he was found to be in diabetic ketoacidosis with a pH of 7.07, elevated white count of 26.2, sugar 691, sodium was down at 127, potassium up to 5.4, CO2 down 10. Hospitalist services were contacted for further evaluation.   PAST MEDICAL HISTORY: Diabetes, tooth abscess, history of kidney stone, severe [facility].   PAST SURGICAL HISTORY: Lithotripsy and abscesses that needed drainage.   ALLERGIES: PENICILLIN AND SULFA.   MEDICATIONS: Include Tylenol, 70/30 insulin 60 units in the a.m., 55 units in the p.m.   SOCIAL HISTORY: Smokes half pack per day. No alcohol. No drug use. Currently not working.   FAMILY HISTORY: Mother with breast cancer now with lung cancer. Father unknown history, but they do know COPD.  Sister with a sacrococcyx teratoma.  REVIEW OF SYSTEMS:  CONSTITUTIONAL: Positive for chills. Positive for sweats. Positive for weight loss. Positive for fatigue.  EYES: Positive for blurry vision.  EARS, NOSE, MOUTH, AND THROAT: Born with a left ear closed,  positive for runny nose. No sore throat.  CARDIOVASCULAR: Positive for chest pain.  RESPIRATORY: Positive for shortness of breath. No cough. No sputum. No hemoptysis.  GASTROINTESTINAL: Positive for nausea. Positive for vomiting. Positive for hematemesis. Positive for abdominal  cramping positive for diarrhea.  GENITOURINARY: No burning on urination or hematuria.  MUSCULOSKELETAL: No joint pain or muscle pain.  INTEGUMENT: No rashes or eruptions.  NEUROLOGIC: No fainting or blackouts.  PSYCHIATRIC: No anxiety or depression.  ENDOCRINE: No thyroid problems.  HEMATOLOGIC/LYMPHATIC: No anemia, no easy bruising or bleeding.   PHYSICAL EXAMINATION: VITAL SIGNS: Temperature 97.7, pulse 122, respirations 18, blood pressure 148/76, pulse oximetry 100%.  GENERAL: No respiratory distress.  EYES: Conjunctivae and lids normal. Pupils equal, round, and reactive to light. Extraocular muscles intact. No nystagmus.  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 Nasal mucosa: No erythema.  Throat: No erythema, no exudate seen. Poor dentition with swelling of the right upper gum.  NECK: No JVD. No bruits. No lymphadenopathy. No thyromegaly. No thyroid nodules palpated.  RESPIRATORY:  Lungs clear to auscultation. No use of accessory muscles to breathe. No rhonchi, rales, or wheeze heard.  CARDIOVASCULAR: S1 and S2, tachycardic. No gallops, rubs or murmurs heard. Carotid upstroke 2+ bilaterally. No bruits.  EXTREMITIES: Dorsalis pedis pulses 2+ bilaterally and no edema of the lower extremity. ABDOMEN: Soft. Slight tenderness in the epigastric area. No organomegaly/splenomegaly. Normoactive bowel sounds. No masses felt.  LYMPHATIC: No lymph nodes in the neck.  MUSCULOSKELETAL: No clubbing, edema or cyanosis.  SKIN: No ulcers or lesions seen.  NEUROLOGIC: Cranial nerves II through XII grossly intact. Deep tendon reflexes 1+ bilateral lower extremities.  PSYCHIATRIC: The patient is oriented to person, place and time.  LABORATORY AND RADIOLOGICAL DATA: Urine toxicology negative. Urinalysis 3+ blood, 2+ ketones, greater than 500 mg/dL of glucose. I added on a hemoglobin A1c which is 11.2.  Lipase 69. Beta hydroxybutyrate greater than 46, phosphorus 7.6, magnesium 2.5, glucose 691, BUN 26, creatinine 1.37, sodium 127, potassium 5.4, chloride 88, CO2 of 10, calcium 10.1, alkaline phosphatase 158, ALT 14, AST 12. White blood cell count 26.2, hemoglobin and hematocrit 15.5 and 48.0, platelet count of 470, venous pH 7.07.   EKG: Sinus tachycardia, no acute ST-T wave changes.   ASSESSMENT AND PLAN: 1.  Diabetic ketoacidosis. We will admit to the intensive care unit. Put on hyperglycemia protocol with insulin drip and continue to monitor sugars q.1 hour and adjust insulin based on that. Give vigorous IV fluid with 2 liters wide open.  2.  Nausea and vomiting, slight hematemesis. We will put on a Protonix, get serial hemoglobins, and guaiac stools.  3.  Hyponatremia secondary to diabetic ketoacidosis, will improve with sugars coming down.  4.  Hyperkalemia. Will watch potassium closely. Should come down with the diabetic ketoacidosis.  5.  Systemic inflammatory response syndrome with tachycardia and leukocytosis. Will hold off on antibiotics at this point likely secondary to diabetic ketoacidosis, nausea and vomiting.  6.  Tobacco abuse. Smoking cessation counseling done 3 minutes by me. Nicotine patch applied.  7.  Tooth abscess.  We will put on IV clindamycin at this point. We will need to follow up at the [facility] dental clinic.  8.  Severe hydrocephalus. Follow up with [facility] neurosurgery.  9.  Overall prognosis poor. Insight poor. The patient must take his insulin or else he will keep on ending up in the hospital.   TIME SPENT ON ADMISSION: 50 minutes of critical care time    ____________________________ Herschell Dimes. Renae Gloss, MD rjw:dp D: 05/22/2013 16:00:33 ET T: 05/22/2013 16:14:58 ET JOB#: [id number]  cc: Herschell Dimes. Renae Gloss, MD, <Dictator> Salley Scarlet MD ELECTRONICALLY SIGNED 06/05/2013 16:31

## 2015-02-11 NOTE — Consult Note (Signed)
[name] NAME:  Mark [name], Mark [name] [id number] OF BIRTH:  06/01/1986 OF CONSULTATION:  08/31/2013 PHYSICIAN: Krystal Eaton, MDPHYSICIAN:  Kristine Linea, MD FOR CONSULTATION: To evaluate for bipolar disorder.  IDENTIFYING DATA: Mark [name] is a 29 year old man with a history of depression and diabetes.  COMPLAINT: "I do my best."  OF PRESENT ILLNESS:  Mark [name] has a history of depression. During his recent hospitalization for DKA, Dr. Toni Amend started Mark [name] on Celexa. Unfortunately, Mark [name] could not afford it. He is in Mark hospital for DKA again. Apparently, Mark [name] has not been able to afford Mark insulin either. He has no income and even with Medicaid he needs 50 dollars a month to pay for his medications. He applied for disability but was refused and is in Mark process of appealing. He has no income and his mother, who is now diagnosed with cancer, pays for his medications. Recently Mark family experienced financial difficulties. He believes that next month could be a little better financially. Mark [name] reports that he is able to manage Mark insulin when it is available. Maintaining proper diet in Mark face of food shortage is also a problem.  Mark [name] reports many symptoms of depression with poor sleep, decreased interest, feelings of guilt, hopelessness, worthlessness, poor energy, memory and concentration, anhedonia and social isolation. He denies suicidal ideation but frequently feels overwhelmed and powerless. He denies excessive anxiety, psychosis or symptoms suggestive of bipolar mania. In Mark past, he was diagnosed with bipolar illness but there is no history of manias. He denies alcohol or illicit substance use. PSYCHIATRIC HISTORY: There is one psychiatric hospitalization when a teenager for "wild behavior". There were no other hospitalizations. He was reportedly diagnosed with bipolar disorder when he was an adolescent and was treated with antidepressants and Risperdal. This was  at Mark time when he was using substances, mostly cannabis. He does not drink and does not use drugs regularly now.  PSYCHIATRIC HISTORY: None reported.  MEDICAL HISTORY: Insulin-dependent diabetes with frequent DKA, Hydrocephalus, history of nephrolithiasis.    MEDICATIONS: Insulin Levemir 35 Units daily, SSI, Pentoprazole 40 mg daily, Lovenox 40 mg s.c. daily.    AUGMENTIN, PENICILLIN AND SULFA DRUGS.  HISTORY: He lives with his ailing mother and a dog. He is Mark youngest of 4 children. He does not have any income. He has applied for disability and was refused once already. He is in Mark process of appealing.  OF SYSTEMS:  No fevers or chills. No weight changes.  No double or blurred vision. No hearing loss. No shortness of breath or cough. No chest pain or orthopnea. No abdominal pain, nausea, vomiting or diarrhea. No incontinence or frequency. No heat or cold intolerance. No anemia or easy bruising. No acne or rash. No muscle or joint pain. No tingling or weakness.  See history of present illness for details.  EXAMINATION:SIGNS: Blood pressure 114/53, pulse 128, respirations 17, temperature 99.2. This is a well developed young male in no acute distress. rest of Mark physical examination is deferred to his primary attending.  DATA: Chemistries: blood glucose 680, BUN 23, Cr 1.73, Na 130, K 5.5. LFTs are within normal limits except AP 154. Blood alcohol level not done. Urine tox screen negative for substances. CBC within normal limits except WBC 25.4. Urinalysis is not suggestive of urinary tract infection.  STATUS EXAMINATION: Mark [name] is alert and oriented to person, place, time and situation. He is pleasant, polite and cooperative. He maintains good eye contact. He is adequately groomed. He  wears a hospital gown. His speech is soft. Mood is "sad" with full affect. Thought process: Logical and goal oriented. Thought content: He denies suicidal or homicidal ideation. There are no psychotic symptoms.  His cognition is grossly intact. His insight and judgment are fair.   DIAGNOSES: I:   Major depressive episode, severe, recurrent. II:  Deferred.  III:  Diabetic ketoacidosis, insulin-dependent diabetes, hydrocephalus.  IV:  Mental and physical illness, financial, access to care, primary support, sick mother.  V:  GAF 45.    1. Mood: Bipolar disorder seems unlikely. No antidepressant is going to solve his problems and will only add to Mark cost. Providing access to medications could be life saving.  Hydrocephalus: In November of 2013 Mark [name] was diagnosed with significant ventriculomegaly still present in October 2014. [facility] but Mark family has no transportation to make necessary appointments. Is there a way [facility]?    3. I will follow along.  Electronic Signatures: Kristine LineaPucilowska, Kendelle Schweers (MD)  (Signed on 11-Nov-14 04:31)  Authored  Last Updated: 11-Nov-14 04:31 by Kristine LineaPucilowska, Ariatna Jester (MD)

## 2015-02-11 NOTE — H&P (Signed)
PATIENT NAME:  Mark [name], Mark [name] MR#:  [id number] DATE OF BIRTH:  [birth year]  DATE OF ADMISSION:  09/15/2013  REFERRING PHYSICIAN: Dr. Zenda Alpers.   PRIMARY CARE PHYSICIAN: None.   CHIEF COMPLAINT: Nausea and vomiting.   HISTORY OF PRESENT ILLNESS: This is a 29 year old Caucasian gentleman with past medical history of diabetes that is insulin-dependent, who is presenting with a two-day duration of nausea, vomiting, as well as polyuria. States he has been not taking his insulin for at least two days secondary to inability to afford his medications. Of note, he was recently admitted earlier this month for similar symptoms, found to be in DKA at that time. On arrival he was found to have a glucose greater than 1200. He has thus far received 3 liters of normal saline and then subsequently started on insulin drip. He is without complaint other than polyuria, polydipsia. Currently, his nausea and vomiting are controlled.  REVIEW OF SYSTEMS:  CONSTITUTIONAL: Denies fever, fatigue, weakness, pain.  EYES: Denies blurred vision, double vision, eye pain.  EARS, NOSE, THROAT: Denies tinnitus, ear pain, hearing loss.  RESPIRATORY: Denies cough, wheeze, shortness of breath.  CARDIOVASCULAR: Denies chest pain, palpitations, edema.  GASTROINTESTINAL: Positive for nausea, vomiting. Denies abdominal pain, hematemesis.  GENITOURINARY: Denies dysuria, hematuria. Positive for polyuria.  ENDOCRINE: Positive for polydipsia. Denies thyroid problems.  HEMATOLOGY AND LYMPHATIC: Denies easy bruising or bleeding.  SKIN: Denies rash or lesions.  MUSCULOSKELETAL: Denies pain in neck, back, shoulder, knees or hips, any arthritic symptoms.  NEUROLOGIC: Denies paralysis, paresthesias.  PSYCHIATRIC: No anxiety or depressive symptoms.  Otherwise, full review of systems performed by me is negative.   PAST MEDICAL HISTORY: Diabetes insulin-dependent, hypertension, hyperlipidemia and bipolar disorder.   SOCIAL HISTORY: Every day  smoker, denies any alcohol or tobacco usage. Lives with his mother.   FAMILY HISTORY: Positive for diabetes.   ALLERGIES: AUGMENTIN, PENICILLIN AND SULFA DRUGS.   HOME MEDICATIONS: Include 70/30 insulin 45 units at night, 65 units in the morning.   PHYSICAL EXAMINATION: VITAL SIGNS: Temperature 98.4, heart rate 80, respirations 20, blood pressure 133/71, saturating 96% on room air. Weight 95.3 kg, BMI 27.  GENERAL: Well-nourished, well-developed, Caucasian gentleman who is currently in no acute distress.  HEAD: Normocephalic, atraumatic.  EYES: Pupils equal, round, reactive to light. Extraocular muscles intact. No scleral icterus.  MOUTH: Dry mucosal membranes. Dentition intact. No abscess noted.  EARS, NOSE, AND THROAT: Throat clear, without exudates. No external lesions.  NECK: Supple. No thyromegaly. No nodules. No JVD.  PULMONARY: Clear to auscultation bilaterally without wheeze, rubs or rhonchi. No use of accessory muscles. Good respiratory effort.  CHEST: Nontender to palpation.  CARDIOVASCULAR: S1, S2, regular rate and rhythm. No murmurs, rubs, or gallops. No edema. Pedal pulses 2+ bilaterally.  GASTROINTESTINAL: Soft, nontender, nondistended. No masses. Positive bowel sounds. No hepatosplenomegaly.  MUSCULOSKELETAL: No swelling, clubbing or edema. Range of motion full in all extremities.  NEUROLOGIC: Cranial nerves II through XII intact. No gross focal neurological deficits. Sensation intact. Reflexes intact.  SKIN: No ulcerations, lesions, rashes or cyanosis. Does have multiple tattoos. Skin is warm and dry. Turgor is intact.  PSYCHIATRIC: Mood and affect within normal limits. The patient awake, alert, oriented x3. Insight and judgment intact though poor.   LABORATORY DATA: Sodium 124, potassium 5.5, chloride 90, bicarbonate 24, BUN 15, creatinine 1.49, glucose 1202. LFTs: Alkaline phosphatase 131, remainder of LFTs within normal limits. WBC 5.8, hemoglobin 13.4, hematocrit 43.2,  platelets 252. Urinalysis negative for evidence of infection; however, glucosuria  greater than 500.   ASSESSMENT AND PLAN: [name] Caucasian gentleman with history of diabetes, which is insulin-dependent, presenting with nausea and vomiting, found to have glucose greater than 1200 because he has not been taking his medications in over two days. 1.  HHS. He has received 3 liters normal saline thus far, as well as started on insulin drip 0.1 unit//kg/hour. We will continue insulin drip at 0.1 unit/kg/hour per hour as well as initiate normal saline at 250 mL with Accu-Cheks q.1 hour, MP q.4 hour with a goal blood glucose 250 to 300. When reaching glucose of 300, will change IV fluids to D5 half normal saline at 200 mL/hour and will change insulin drip to 0.5 units/kg/hour until he is able to tolerate p.o. When he can tolerate p.o., we can change his insulin to long acting and then turning off his insulin drip hour after that dose. He has no evidence of infection; this is most likely caused by medical noncompliance.  2.  Hyponatremia. IV fluid hydration. 3.  Elevated alkaline phosphatase of unclear etiology. Will follow the trend. May need a right upper quadrant ultrasound, but he is completely asymptomatic and nontender.  4.  Deep venous thrombosis prophylaxis with heparin subcutaneous.   CODE STATUS: The patient is full code.   CRITICAL CARE TIME SPENT: 45 minutes.    ____________________________ Cletis Athensavid K. Hower, MD dkh:cg D: 09/15/2013 04:30:28 ET T: 09/15/2013 05:03:21 ET JOB#: [id number]  cc: Cletis Athensavid K. Hower, MD, <Dictator> DAVID Synetta ShadowK HOWER MD ELECTRONICALLY SIGNED 09/15/2013 23:23

## 2015-02-11 NOTE — Discharge Summary (Signed)
PATIENT NAME:  Mark [name], Mark [name] MR#:  [id number] DATE OF BIRTH:  1986-02-19  DATE OF ADMISSION:  11/28/2012 DATE OF DISCHARGE:  11/29/2012  PR[facility]IC[facility]  FINAL DIAGNOSES:  1.  Diabetic ketoacidosis.  2.  Severe hydrocephalus.  3.  Pseudohyponatremia.  4.  Acute renal failure.  5.  Systemic inflammatory response syndrome.  6.  Tobacco abuse.  7.  Bipolar disorder.   MEDICATIONS ON DISCHARGE: Include: 70/30 insulin 55 units subcutaneous injection in the morning; 70/30 insulin 35 units subcutaneous injection in the evening.   ACTIVITY: As tolerated.   diet: 1800 ADA diet.   referrals: To [facility] in 1 to 2 months. Follow up  with your medical doctor in 1 to 2 weeks.   HOSPITAL COURSE:  The patient was admitted November 29, 2011,  with nausea, vomiting, high sugars.   HISTORY OF PRESENT ILLNESS: [name] man with uncontrolled diabetes, multiple episodes of DKAs, numerous hospitalizations secondary to noncompliance with insulin regimen. He states that he ran out of insulin three days ago. He was found to have elevated blood sugars, more than 600. He was tachycardic, in acute renal failure, leukocytosis, and hospitalist services were contacted for evaluation. He was admitted to the Critical Care Unit, started on the hyperglycemia protocol with insulin drip and IV fluid hydration.   Laboratory and radiological data during the hospital course included for a sugar greater than 600, white blood cell count 26.9, hemoglobin 16.4 and hematocrit 50.2, platelet count of 366. Glucose 695, BUN 27, creatinine 1.52, sodium 127, potassium 4.5, chloride 87, CO2 of 10, calcium 9.4. Liver function tests normal range. Total protein up at 9.2. ABG showed a pH of 7.10, pCO2 of 23, pO2 of 103, oxygen saturation 99.3. Chest x-ray showed no acute abnormality. Hemoglobin A1c 11.5.  Creatinine came down to 1.18. White count improved to 20.4. An MRI of the brain showed hydrocephalus,  which is severe, stable, dilation of the third and lateral ventricles. Fourth ventricle is normal. No mass in the region of the cerebral aqueduct.  Neurosurgical consultation recommended.   Hospital course per problem list:  1.  For the patient's diabetic ketoacidosis, the patient was converted off the insulin drip, was given Lantus insulin, but I think he can go back to his 70/30 insulin secondary to cost.  Regular regimen recommended, 55 units in the a.m. and 35 units in the p.m. This he can get over-the-counter, but I did write him a script for this. The care manager actually filled this script for him. This gentleman comes in to the hospital numerous times. I think just restarting his medications, then he is able to go home. Noncompliance is his biggest issue.  2.  Severe hydrocephalus: I did speak with the neurosurgeon resident on-call, [facility] Apparently he was hospitalized there and they recommended followup within 4 to 6 months at the neurosurgical service. He did have the hydrocephalus on imaging tests there.  Since it has been within the timeframe that they recommended, he still has 1 to 2 months to get to this followup appointment.  Dr. 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 The patient states that he will follow up. I do not have any neurosurgical services here at this hospital. I did walk him around and he was wide-based gait but was steady without any walking devices. No urgent need for transfer at this  point in time since this patient has had this for a long period of time.  3.  Pseudohyponatremia: This was secondary to elevated glucose. Repeat sodium 134 upon discharge.  4.  Acute renal failure: This improved to a creatinine of 1.18 and a GFR of greater than 60.  5.  Systemic inflammatory response syndrome: Could be secondary to nausea, vomiting secondary to DKA. No signs of  infection were seen. No antibiotics were given.  6.  Tobacco abuse: Smoking cessation counseling done during the hospitalization.  7.  Bipolar: Not on any medication for this.   The patient was discharged on November 29, 2012; computers were down at that point in time so the instructions were handwritten and note was handwritten.  Dictation needed to be done on the next day when the computers were up in order to have complete coverage of the hospital stay.   Time Spent ON discharge: On November 29, 2012, over 40 minutes.    ____________________________ Herschell Dimes. Renae Gloss, MD rjw:th D: 11/30/2012 14:39:42 ET T: 11/30/2012 16:41:08 ET JOB#: [id number]  cc: Herschell Dimes. Renae Gloss, MD, <Dictator> Salley Scarlet MD ELECTRONICALLY SIGNED 12/10/2012 13:32

## 2015-02-11 NOTE — Discharge Summary (Signed)
PATIENT NAME:  Mark [name], Trueman Q MR#:  [id number] DATE OF BIRTH:  1986/08/07  DISCHARGE DIAGNOSES:  1.  Diabetic ketoacidosis, resolved. 2.  Type 1 diabetes mellitus.  3.  Depression. 4.  Tobacco abuse.   DISCHARGE MEDICATIONS:  1.  Celexa 20 mg daily.  2.  Cleocin  300 mg every 6 hours.  3.  Insulin NPH 70/30 at 60 units daily before breakfast, 40 units at bedtime.  4.  The patient received clindamycin until November 5th, that is 10 days .   CONSULTATION: With Dr. Toni Amendlapacs from psychiatry.   HOSPITAL COURSE: The patient is a 29 year old male with diabetes mellitus, type 1, frequent admissions for DKA, admitted on the 23rd. The patient was discharged recently on the 19th October, went home, came back because of problem controlling his blood sugars, included nausea, vomiting and feverish sensation. The patient says that he did not get enough food to eat from the 19th of October to the 22nd, so the patient came in with elevated sugars with nausea and vomiting. The patient says that at the end of the month he does not have enough money to buy food. Lives with the mother and the patient came in because of high sugars.   The patient had a history of syncope and also had some cough. Glucose 547, bicarb 11, and the patient was admitted for DKA with hyponatremia. Admitted to the hospitalist service with DKA and started insulin drip, admitted to ICU. Initial sugars were more than 600. EKG showed sinus tachycardia, anion gap was 20 on admission. Sodium 128. On Chem-7, sugars were 931.   The patient was started on IV fluids. Sodium nicely improved to 136. Potassium was low at 4.6 which was  replaced. Magnesium 1.62, replaced. Urine tox was negative. The patient was started on antibiotics for bronchitis. The patient started on Levaquin.   He also had some tooth pain, a bad tooth, dentition. The patient, Levaquin helped him but because Levaquin is expensive, he was discharged on clindamycin. Got diarrhea  stool. C. difficile was negative.   The patient was seen by Dr. Toni Amendlapacs for depression, started on Celexa, and also seen by case manager for helping with financial situation. The patient lives with the mom, and the patient advised to cut down smoking and use the money for food. The patient continues to get connected with therapist and community, people to support him.   The patient discharged with ( home insulinregimen along with clindamycin for his bronchitis and tooth infection. Also I have advised him to follow up with dentist.   Depression. The patient started on Celexa. Discharge vital signs stable. The patient went home in stable condition.   TIME SPENT ON DISCHARGE PREPARATION: More than 30 minutes.   DISCHARGE VITAL SIGNS: Temperature 98.2. Heart 74, blood pressure 122/70, sats 98% on room air.   ____________________________ Katha HammingSnehalatha Sebrena Engh, MD sk:np D: 08/18/2013 22:14:00 ET T: 08/18/2013 22:48:07 ET JOB#: [id number]   cc: Katha HammingSnehalatha Kahner Yanik, MD, <Dictator> Katha HammingSNEHALATHA Kailon Treese MD ELECTRONICALLY SIGNED 09/03/2013 21:56

## 2015-02-11 NOTE — Discharge Summary (Signed)
PATIENT NAME:  Mark [name], Mark [name] MR#:  [id number] DATE OF BIRTH:  12/20/1985  DATE OF ADMISSION:  10/12/2013 DATE OF DISCHARGE:  10/13/2013  PRIMARY CARE PHYSICIAN:  Nonlocal.  DISCHARGE DIAGNOSES: 1.  Diabetic ketoacidosis. 2.  Uncontrolled diabetes 1.  3.  Acute renal failure.  4.  Leukocytosis.   CONDITION: Stable.   CODE STATUS: FULL CODE.   HOME MEDICATION:  NPH 70/30, 65 units once a day in the morning and 45 units  at bedtime.   DIET: Low fat, low cholesterol, ADA diet.   ACTIVITY: As tolerated.   FOLLOW-UP CARE: Follow with PCP within 1 to 2 weeks. Compliance to medications.   REASON FOR ADMISSION: Severe nausea and vomiting.   HOSPITAL COURSE: The patient is a 29 year old Caucasian male with a history of multiple admissions with DKA, uncontrolled, diabetes 1,  presented to the ED with severe nausea and vomiting.  The patient was noted to have DKA in the ED and has been treated with insulin drip and aggressive IV fluid support. For detailed history and physical examination, please refer to the admission note dictated by Dr. Mordecai MaesSanchez.   On admission date, the patient's BUN 24, creatinine 1.54, blood sugar 717, sodium 130, potassium 5.6, bicarbonate 7. Anion gap 29. WBC 16, hemoglobin 14.6, pH of 7.02, bicarbonate 19. Chest x-ray did not show any significant abnormality.   Diabetic ketoacidosis. The patient was admitted for Critical Care Unit. After admission, the patient has been treated with insulin drip and aggressive IV fluid support. The patient's anion gap closed to 7 this morning. In addition the patient's blood sugar has been around 100 on insulin drip. We resumed the patient's diet and stopped the insulin drip and gave the patient NovoLog 70/30 with home doses.  So far, the patient's blood sugar has been controlled. The last blood sugar is 75. The patient has no complaint.  He tolerated diet well. We repeated his creatinine just now, it decreased to 1.1 and WBC decreased to  12.4. The patient's vital signs are stable.   PHYSICAL EXAMINATION: He is unremarkable. He is clinically stable.  He will be discharged to home today.  Otherwise, the patient is in compliance to medication. I gave prescription to the patient. I discussed the discharge plan with the patient, nurse and case manager.   TIME SPENT: About 36 minutes.   ____________________________ Shaune PollackQing Qais Jowers, MD qc:dp D: 10/13/2013 14:49:09 ET T: 10/13/2013 15:06:26 ET JOB#: [id number]  cc: Shaune PollackQing Aldwin Micalizzi, MD, <Dictator> Shaune PollackQING Isel Skufca MD ELECTRONICALLY SIGNED 10/13/2013 17:06

## 2015-02-11 NOTE — H&P (Signed)
PATIENT NAME:  Mark [name], Glenard Q MR#:  [id number] DATE OF BIRTH:  October 09, 1986  PRIMARY CARE PHYSICIAN: He does not have one.   CHIEF COMPLAINT: Intractable nausea, vomiting and acute diabetic ketoacidosis.   HISTORY OF PRESENT ILLNESS: This is a 29 year old male who presents to the hospital due to ongoing nausea and vomiting now for the past 2 days. The patient said that he has not been feeling well now for the past 2 days. Despite not feeling well, the patient has been taking his insulin, he says. The patient also complains of shortness of breath and a cough but nonproductive. His shortness of breath is even on minimal exertion. He admits to multiple episodes of vomiting over the past 2 days which has  been consistent with nonbloody and bilious in nature. He has been able to eat a little bit but also has not been able to keep any fluids down.   The patient presented to the ER as his symptoms are not improving, and he was noted to be severely hyperglycemic and noted to be in acute diabetic ketoacidosis. Hospitalist services were contacted for further treatment and evaluation.   REVIEW OF SYSTEMS:  CONSTITUTIONAL: No documented fever. No weight gain or weight loss.  EYES: No blurred or double vision.  ENT: No tinnitus. No postnasal drip. No redness of the oropharynx.  RESPIRATORY: Positive cough. No wheeze. No hemoptysis. Positive dyspnea.  CARDIOVASCULAR: No chest pain. No orthopnea. No palpitations. No syncope.  GASTROINTESTINAL: Positive nausea. Positive vomiting. No diarrhea. Positive generalized abdominal pain. No melena or hematochezia.  GENITOURINARY: No dysuria or hematuria.  ENDOCRINE: No polyuria or nocturia. No heat or cold intolerance.  HEMATOLOGIC: No anemia. No bruising. No bleeding.  INTEGUMENTARY: No rashes. No lesions.  MUSCULOSKELETAL: No arthritis. No swelling. No gout.  NEUROLOGIC: No numbness or tingling. No ataxia. No seizure activity.  PSYCHIATRIC: No anxiety. No insomnia.  No ADD.   PAST MEDICAL HISTORY: Consistent with diabetes, history of DKA, history of medical noncompliance, tobacco abuse.   ALLERGIES: AUGMENTIN, PENICILLIN, AND SULFA DRUGS.   SOCIAL HISTORY: Does smoke about 1/2 pack per day. Has been smoking for the past 15 years. Occasional alcohol use. No illicit drug abuse although did say he did smoke marijuana 6 months ago.   FAMILY HISTORY: Mother has breast cancer and lung cancer. She lives with the patient. The patient does not recall anything on his father's history.   CURRENT MEDICATIONS: He is on Humulin 70/30 at 60 units in the morning and 45 units at bedtime.   PHYSICAL EXAMINATION:  VITAL SIGNS: Temperature is 98.1. Pulse 110, respirations 18, blood pressure 150/67, sats 96% on room air.  GENERAL: A pleasant-appearing male but in no apparent distress.  HEENT: Atraumatic, normocephalic. Extraocular muscles are intact. Pupils are equal and reactive to light. Sclerae anicteric. No conjunctival injection. No pharyngeal erythema.  NECK: Supple. There is no jugular venous distention. No bruits. No lymphadenopathy. No thyromegaly.  HEART: Regular rate and rhythm. Tachycardic. No murmurs. No rubs. No clicks.  LUNGS: He has coarse rhonchi and wheezing diffusely. Negative use of accessory muscles and no dullness to percussion. Good air entry bilaterally.  ABDOMEN: Soft, flat, nontender, nondistended. Has good bowel sounds. No hepatosplenomegaly appreciated.  EXTREMITIES: No evidence of any cyanosis, clubbing, or peripheral edema. Has +2 pedal and radial pulses bilaterally.  NEUROLOGICAL: Alert, awake and oriented x3 with no focal motor or sensory deficits appreciated bilaterally.  SKIN: Moist and warm with no rashes appreciated.  LYMPHATIC: There is no  cervical or axillary lymphadenopathy.   LABORATORY DATA: Serum glucose of 608, BUN 17, creatinine 1.1, sodium 129, potassium 5.1, chloride 95, bicarbonate 11, anion gap of 23. LFTs are within normal  limits. Troponin less than 0.02. White cell count of 15.4, hemoglobin 15, hematocrit 46.1, platelet count 307. ABG showed a pH of 7.17, pCO2 25, pO2 153 , sats 99%.   The patient did have a chest x-ray done which showed findings consistent with reactive airway disease or COPD with superimposed acute bronchitis.   ASSESSMENT AND PLAN: This is a 29 year old male with a history of diabetes, diabetic ketoacidosis, medical noncompliance, and tobacco abuse who presents to the hospital due to intractable nausea, vomiting and noted to be in acute diabetic ketoacidosis.  1.  Acute diabetic ketoacidosis. Most likely this is secondary to medical noncompliance. I will start the patient on aggressive IV fluids, also start the patient on insulin drip. Follow serial metabolic panel's  until anion gap closes. Will replace his potassium once it falls below 4.5. Will check a hemoglobin A1c and follow him clinically.  2.  Intractable nausea and vomiting. This is likely related to the acute diabetic ketoacidosis. Continue supportive care with intravenous fluids, antiemetics for now.  3.  Hyponatremia. This is likely pseudohyponatremia secondary to hyperglycemia. It should correct as the sugars are improved. Will follow his sodium.  4.  Leukocytosis. This is likely stress-mediated from diabetic ketoacidosis. I will follow his white cell count as his diabetic ketoacidosis improves. Hold off on antibiotics for now.  5.  Shortness of breath. This is likely secondary to acute bronchitis. The patient's chest x-ray shows no evidence of acute pneumonia. I will place the patient on p.r.n. DuoNebs. I would recommend starting the patient on a prednisone taper once his diabetic ketoacidosis resolves. I will start the patient on a Z-Pak for now.   CODE STATUS: FULL CODE.   CRITICAL CARE TIME SPENT: 50 minutes.    ____________________________ Rolly Pancake. Cherlynn Kaiser, MD vjs:np D: 07/09/2013 14:39:51 ET T: 07/09/2013 15:04:21  ET JOB#: [id number]  cc: Rolly Pancake. Cherlynn Kaiser, MD, <Dictator> Houston Siren MD ELECTRONICALLY SIGNED 07/09/2013 17:41

## 2015-02-11 NOTE — Discharge Summary (Signed)
PATIENT NAME:  Mark [name], [name] MR#:  [id number] DATE OF BIRTH:  1986-03-09  DATE OF ADMISSION:  05/22/2013 DATE OF DISCHARGE:  05/23/2013  PRESENTING COMPLAINTS: Vomiting and elevated sugars.   DISCHARGE DIAGNOSES: 1.  Acute diabetic ketoacidosis, secondary to noncompliance with insulin.  2.  Nausea and vomiting, resolved.  3. Leukocytosis; suspected, secondary to diabetic ketoacidosis and reactive from vomiting likely due to stress.  4.  Poor dental hygiene, with suspected gingivitis and possible tooth cavities in the teeth.  5.  Type 1 diabetes, insulin-dependent.   MEDICATIONS AT DISCHARGE: 1.  Insulin 70/30, 65 units before breakfast, 45 units at bedtime.  2.  Tylenol 325, 2 tablets q.4 as needed.  3.  Cleocin 300 mg, 2 capsules every eight hours for poor dental hygiene.   FOLLOWUP: With your dentist as soon as possible.   Carbohydrate-controlled diet.   FOLLOWUP: With your PCP in 1 to 2 weeks.   Tereasa CoopMichael Cartwright is a 29 year old Caucasian gentleman known to our service from previous many similar admissions in the past, came in with nausea, vomiting and was admitted with:   1.  Acute diabetic ketoacidosis, recurrent, due to noncompliance to insulin. The patient received an IV insulin drip, IV fluids, and anion gap closed. Sugars better. He was changed to 70/30, his home dose, prior to discharge.  2.  Nausea and vomiting: IV fluids were given; antiemetics were given as well. The patient was tolerating a regular diet prior to discharge.  3.  Hyponatremia, secondary to diabetic ketoacidosis; improved with insulin drip.  4.  Hyperkalemia, resolved.  5.  Systemic inflammatory response syndrome, (SIRS) with tachycardia and leukocytosis due to diabetic ketoacidosis, and the patient also has poor dentition with gingivitis and possible dental caries. He was started on p.o. clindamycin; asked to follow up with his dentist.  6.  Smoking cessation was advised.   Hospital stay otherwise  remained stable.   THE PATIENT REMAINED A FULL CODE.   Time spent: Forty minutes.     ____________________________ Wylie HailSona A. Allena KatzPatel, MD sap:dm D: 05/24/2013 06:44:58 ET T: 05/24/2013 07:32:59 ET JOB#: [id number]  cc: Toretto Tingler A. Allena KatzPatel, MD, <Dictator> Willow OraSONA A Keatin Benham MD ELECTRONICALLY SIGNED 06/11/2013 7:29

## 2015-02-11 NOTE — H&P (Signed)
PATIENT NAME:  Mark [name], Mark [name] MR#:  [id number] DATE OF BIRTH:  January 04, 1986  DATE OF ADMISSION:  10/23/2012  REASON FOR ADMISSION: Nausea, vomiting, abdominal pain, dehydration. The patient says that he is in diabetic ketoacidosis.   HISTORY OF PRESENT ILLNESS: The patient is a 29 year old gentleman with history of type 1 diabetes with a problem of noncompliance, bipolar disorder, hyperlipidemia, tobacco abuse, hypertension, recurrent MRSA infections, history of nephrolithiasis in the past, chronic back pain, who comes today with DKA due to not taking his medications for 3 to 4 days. The patient states that last time he took his medication was about 3 or 4 days ago. He is not able to afford them because he wanted to buy presents for Christmas. He has been feeling very tired, started with nausea and mild abdominal pain this morning, and his legs feel like they are on fire with pain that is burning sensation. He has been taking 55 units of 70/30 in the morning and 35 units at night. He states that he was able to afford them for a month, although looking at the records he was discharged with free medication the last time that he was here in November which should have lasted for a month, so that medication is gone. At this moment, his results are back showing a glucose which is 858, creatinine of 1.8, anion gap of 33, and a CO2 of 9.0. He has received 2 boluses of NS, and insulin drip has been ordered. He has an elevated white blood count which is likely to be reactive. There is no sign of infection, and he states he has not had any upper respiratory infections or other issues. He is in acute kidney injury. His creatinine is around 0.9 on baseline, today is 1.8. UA shows increased protein but no signs of infection. His chest x-ray did not show any new infiltrate. The patient was admitted to the intensive care unit for DKA protocol.   REVIEW OF SYSTEMS: A 12-system review of systems is done.  CONSTITUTIONAL: The  patient denies any fevers or chills. He denies any significant weight loss or weight gain. He states he has been eating normally; he is just not using his insulin.  EYES: Denies any diplopia, blurry vision or pain in his eyes.  ENT: No difficulty swallowing. No epistaxis. No sore throat. No tinnitus.  CARDIOVASCULAR: Negative chest pain. Negative palpitations. Negative edema.  RESPIRATORY: No cough. No shortness of breath. No hemoptysis. No sputum. GASTROINTESTINAL: The patient states he had some nausea and epigastric pain which is usual whenever he has DKA. No melena. No hematemesis. No changes in his bowel habits. GENITOURINARY: No dysuria. No hematuria. Positive polyuria.  MUSCULOSKELETAL: Positive low back pain which is chronic, also significant pain on his feet and legs which is burning due to neuropathy.  SKIN: No new rashes. No new abscesses.  NEUROLOGIC: No focal weakness. No significant headaches. Positive burning pains due to neuropathy.  PSYCHIATRIC: The patient has a bipolar diagnosis, but he does not take medications. No signs of mania at this moment.  ENDOCRINE: Positive polyuria, polydipsia. Positive type 1 diabetes, noncompliant,  and blood sugars in the 800s.  HEMATOLOGIC/LYMPHATIC: No easy bruising. No swollen lymph nodes.  ALLERGIES: The patient denies any seasonal allergies.   PAST MEDICAL HISTORY: 1. Type 1 diabetes.  2. Ongoing tobacco abuse.  3. Hyperlipidemia.  4. Hypertension.  5. History of MRSA.  6. Bipolar disorder, not adhering to medications.  7. Nephrolithiasis status post lithotripsy.  PAST SURGICAL HISTORY: Positive lithotripsies but no other major surgeries.   ALLERGIES: AUGMENTIN, PENICILLIN AND SULFA DRUGS.   CURRENT MEDICATIONS: Humulin 70/30, 55 units in the morning, 35 units at bedtime. The patient denies taking any other medications.   SOCIAL HISTORY: [place] He is single. He has a sister who also has type 1 diabetes.  He is applying for disability. He smokes about  to 3/4 pack a day. He has been trying to cut down; smoking cessation education given. The patient does not want to quit smoking. He actually wants to get a cigarette from me.   FAMILY HISTORY: Positive with breast cancer in his mom.  The patient says that he is taking care of her because she is really sick. There is also history of coronary disease in her.  His father apparently is healthy. His sister has diabetes as well.     PHYSICAL EXAMINATION: VITAL SIGNS: Blood pressure 144/80, pulse 123, respirations 20, temperature 98.2, oxygen saturation 97% on room air.  GENERAL: The patient is alert, oriented x3, acutely ill-looking, dehydrated-looking. He is comfortable on bed.  HEENT: Pupils are equal and reactive. Extraocular movements are intact. Mucosa are dry. No oral lesions. No oropharyngeal exudates.  NECK: Supple. No JVD. No thyromegaly. No adenopathy. Normal range of motion. No carotid bruits. No rigidity.  CARDIOVASCULAR: Regular rate and rhythm, tachycardic. No murmurs, rubs, or gallops. No displacement of PMI. No tenderness to palpation of the anterior chest wall.  LUNGS: Clear without any wheezing or crepitus. No use of accessory muscles.  ABDOMEN: Soft, mildly tender to palpation on the epigastric area but no rebound or guarding. Bowel movements are positive.  GENITAL: Deferred.  EXTREMITIES: No edema, no cyanosis, no clubbing. Tenderness to palpation of anterior areas of the leg pretibial area due to neuropathy.  SKIN: Very dry skin, peeling on the lower extremities with some scratches on the anterior chest. No signs of infection. No abscesses at this moment. No rashes or petechiae.  PSYCHIATRIC: The patient has bipolar disorder. At this moment he seems to be stable, alert and oriented x 3. No signs of mania at this moment.  NEUROLOGIC: Cranial nerves II through XII are intact. No focal findings. Strength is 5 out of 5 in 4 extremities.   LYMPHATICS: Negative for lymphadenopathy in the neck or supraclavicular areas.  MUSCULOSKELETAL: No significant joint effusions or joint deformity.   LABORATORY, DIAGNOSTIC AND RADIOLOGICAL DATA:  Glucose is 858. Creatinine is 1.8; his normal creatinine is 0.9. His chloride is 90, CO2 is 9 and an anion gap of 33.0. LFTs: Mild increase of alkaline phosphatase at 181, AST is low. Total protein 9.3. White count is 23,000. Hemoglobin is 15, platelets are 356. UA: Positive for protein, negative for white blood cells or red blood cells.   Chest x-ray: Normal.  EKG: Sinus tachycardia. There is likely right ventricle or right atrium enlargement with peaked P waves.   ASSESSMENT/PLAN: The patient is a [age] year old noncompliant diabetic type I who comes with DKJA. He also has history of bipolar disorder, hypertension. He does not take any medications for this. He also has history of MRSA abscesses which, at this moment, are not active. He is admitted for treatment of DKA.  1. Diabetes, ketoacidosis: The patient has elevation of glucose in the 800s. He had an anion gap of 33 and a pCO2 of 9.0. The patient was admitted on an insulin drip. We are going to check serial BMPs. The heparin drip  is going to be stopped once his anion gap closes and his blood sugars are better. Continue IV fluids due to dehydration at 250 mL/h, as per protocol, and change to D5 half NS whenever the patient reaches blood sugars below 250. The patient is going to be started on his home dose of insulin as soon as the drip is over, and a sliding scale is going to be initiated as well. At this moment, we have to work on getting medications for the patient as an outpatient so he can continue taking them and not get into DKA. There is no sign of infection that will be treated, and the DKA mostly is noncompliance. The patient is admitted to the critical care unit.  2. Hypertension: Stable.  3. Proteinuria: The patient will benefit from an ACE  inhibitor, although he will not take it.  4. Bipolar disorder: Continued to reinforce the need of medication, but the patient does not want to take it.  5. Elevated white blood count: Likely related to his DKA. No signs of infection. No MRSA abscesses at this moment.  6. Acute kidney injury: Likely due to dehydration. The patient looks very dehydrated. He has received 2 liters of NS, and now he is going to be on half-rate NS due to the DKA protocol. We are going to follow up on this in the morning.   7. Deep vein thrombosis prophylaxis: Heparin.  8. Gastrointestinal prophylaxis: Protonix.  9. Pain: We are going to use Percocet and consider adding on gabapentin, although the patient says that he might not take it as an outpatient.   CODE STATUS: The patient is a FULL CODE.     TIME SPENT: About 45 minutes with this admission, critical care time 45 minutes.    ____________________________ Felipa Furnace, MD rsg:cb D: 10/23/2012 10:43:57 ET T: 10/23/2012 11:07:23 ET JOB#: [id number]  cc: Felipa Furnace, MD, <Dictator> Talya Quain Juanda Chance MD ELECTRONICALLY SIGNED 10/31/2012 13:36

## 2015-02-11 NOTE — Discharge Summary (Signed)
PATIENT NAME:  Mark [name], Mark [name] MR#:  [id number] DATE OF BIRTH:  1985/12/19  DATE OF ADMISSION:  08/08/2013 DATE OF DISCHARGE:  08/09/2013  PRESENTING COMPLAINTS: Elevated sugars.   DISCHARGE DIAGNOSES: 1. Acute diabetic ketoacidosis, secondary to medical noncompliance.  2. Poor dental hygiene.  3. Type 1 diabetes.   CODE STATUS: FULL CODE.   MEDICATIONS: 1. Acetaminophen 325, 2 tablets every four hours as needed.  2. Insulin 70/30 Novolin 40 units at bedtime, 16 units at breakfast.  DIET:  Carbohydrate -controlled diet.   FOLLOWUP: With [facility] in 1 to 2 weeks.   LABORATORY DATA: At discharge: Glucose is 71., BUN is 7, creatinine 0.95, sodium is 138, potassium is 3.6. A1c is 11.0.   CHEST X-RAY: No acute cardiopulmonary disease.   Blood cultures negative in 18 to 24 hours.   Tereasa CoopMichael Yarbrough is 29 year old Caucasian gentleman with history of type 1 diabetes, well known to our service from previous many admissions due to DKA, secondary to medical noncompliance, comes in with:  1. Acute recurrent DKA. The patient states he ran out of his insulin 3 to 4 days ago. He was started on IV fluids with aggressive hydration and IV insulin drip. His anion gap gap closed. The patient is off insulin drip. He is started back on his insulin home dose Novolin 70/30. He tolerated p.o. diet prior to discharge.  2. Pseudohyponatremia due to hypoglycemia, resolved.  3. Leukocytosis likely due to DKA.  4. Poor dental hygiene. The patient refuses to take any antibiotics. He is recommended to follow up with a dentist.  5. Tobacco abuse, counseled. The patient refuses to listen.   Hospital stay otherwise remained stable.   CODE STATUS: THE PATIENT REMAINED A FULL CODE.   TIME SPENT: 40 minutes.   ____________________________ Wylie HailSona A. Allena KatzPatel, MD sap:sg D: 08/10/2013 07:25:17 ET T: 08/10/2013 07:51:27 ET JOB#: [id number]  cc: Hermann Dottavio A. Allena KatzPatel, MD, <Dictator> Willow OraSONA A Emlyn Maves MD ELECTRONICALLY  SIGNED 08/16/2013 10:50

## 2015-02-11 NOTE — H&P (Signed)
PATIENT NAME:  Mark [name], Mark [name] MR#:  [id number] DATE OF BIRTH:  January 20, 1986  DATE OF ADMISSION:  08/31/2013  REFERRING PHYSICIAN: Dr. Thomasene Lot.  PRIMARY CARE PHYSICIAN: None.  CHIEF COMPLAINT: Nausea, vomiting, not feeling good.   HISTORY OF PRESENT ILLNESS: The patient is a 29 year old Caucasian male with history of insulin-dependent diabetes mellitus, neuropathy and bipolar disorder who has had multiple hospitalizations in the last several months for DKA. He came into the hospital yesterday and was noted to have DKA it looks like with sugars close to 950 and anion gap metabolic acidosis. He was treated and apparently in the ER was discharged and was given some insulin as he had stated that he had run out of his insulin for 4 days. However, he left the ER apparently after he requested an ambulance which he did not get as a ride for his home. He got upset and he just walked out of the hospital without taking his insulin per staff. He came in today after not feeling well. He has been having nausea and vomiting, cannot keep any food down, and also has not had any insulin. He is noted to have again severe DKA and hospitalist services were contacted for further evaluation and management.   PAST MEDICAL HISTORY:  1.  Ventriculomegaly and he states that he is going to have some kind of a shunt procedure in the next 3 or 4 months. 2.  Nephrolithiasis status post lithotripsy in the past.  3.  Diabetes.  4.  Multiple DKA.  5.  Neuropathy.  6.  Bipolar, per chart. 7.  Hypertension. 8.  Hyperlipidemia, per chart.  9.  Depression.  MEDICATIONS: Tylenol p.r.n., insulin NPH 70/30, 45 units at bedtime and 65 units in the morning, and also he was recently started on Celexa but he stated that he did not have any money and did not take. He stated that he has been having some issues with his accounts and could not afford insulin and has not taken any insulin for 4 or 5 days or so.   ALLERGIES: SULFA AND  PENICILLIN. HE STATED THAT HE ALSO HAD A RASH WITH CLINDAMYCIN.   FAMILY HISTORY: Diabetes on the father's side. Mother had breast cancer. Sister with some bone marrow cancer.   SOCIAL HISTORY: Still smokes, occasional alcohol per chart, is unemployed and waiting for disability, lives with his mom and he states he takes care of her.   REVIEW OF SYSTEMS:  CONSTITUTIONAL: Has had feeling of hot and cold, fatigue, weakness.  EYES: Has had blurry vision for 4 or 5 days.  ENT: No tinnitus or hearing loss. No sore throat or runny nose. Has poor teeth any he states he does not want to talk about it.  RESPIRATORY: Has a smoker's cough he states, sometimes productive. No shortness of breath or wheezing. No painful respirations.  CARDIOVASCULAR: No chest pain or swelling in the legs. No dyspnea on exertion.  GASTROINTESTINAL: Positive for nausea, vomiting and epigastric pain for 4 to 5 days and has been vomiting up to 5 times a day, cannot keep food down. No bloody stools or dark stools.  GENITOURINARY: Has polyuria and polydipsia.  HEMATOLOGIC AND LYMPHATIC: Denies anemia or easy bruising.  SKIN: No rashes.  MUSCULOSKELETAL: Has some chronic leg pain.  NEUROLOGIC: Has pin and needle sensation in his feet bilaterally. No focal weakness or numbness.  PSYCHIATRIC: Has a history of depression and bipolar.   PHYSICAL EXAMINATION: VITAL SIGNS: Temperature on arrival 98.9, pulse rate  122 on admission, respiratory rate 18, blood pressure on arrival 107/63, O2 sat 97% on room air.  GENERAL: The patient is an unkempt-looking young male, lying in bed, no obvious distress.  HEENT: Normocephalic, atraumatic. Pupils are equal and reactive. Anicteric sclerae. Extraocular muscles intact. Dry mucous membranes. Poor dentition.  NECK: Supple. No thyroid tenderness. No cervical lymphadenopathy.  HEART: S1 and S2, tachycardic. No significant murmurs, rubs or gallops.  LUNGS: Clear to auscultation without wheezing,  rhonchi or rales.  ABDOMEN: Soft, nontender and nondistended. Hyperactive bowel sounds. No organomegaly appreciated.  EXTREMITIES: No significant lower extremity edema.  SKIN: No obvious rashes. The patient has multiple tattoos.  NEUROLOGIC: Cranial nerves II through XII grossly intact. Strength is 5/5 in all extremities. Sensation is intact to light touch in the upper and lower extremities.  PSYCHIATRIC: Awake, alert, oriented, cooperative.   LABORATORY AND DIAGNOSTICS: Glucose 680, BUN 23, creatinine 1.73, sodium 130, potassium 5.5. Serum CO2 was 10. Alk phos 154. U-tox negative. White count today is 25.4, yesterday it 8.9. Today hemoglobin is 15.1 and platelets are 467, which is higher than yesterday. Also yesterday BUN was 20, creatinine was 1.26 and sodium of 122. Urinalysis: 2+ blood and ketones. No nitrites or leukocyte esterase, 19 RBCs and 1 WBC.   Yesterday ABG showed pH of 7.27.   Today EKG shows sinus tach, rate of 124, left atrial enlargement and also some peaked T waves in V2 and V3.   ASSESSMENT AND PLAN: We have a 29 year old male with history of multiple episodes of diabetic ketoacidosis, ventriculomegaly, bipolar depression and history of noncompliance who again appears to be noncompliant with insulin and comes in with severe diabetic ketoacidosis with anion gap metabolic acidosis, acute renal failure, hyponatremia and hyperkalemia. He appears to be severely dehydrated with a bump in his BUN and creatinine from yesterday and sugars of close to 700. At this point, he will be admitted to the CCU with aggressive IV fluid resuscitation and insulin drip as diabetic ketoacidosis again appears to be secondary to noncompliance. He stated that he did not have a ride to go pick up and refill his insulin, which I do not know if this is the case, but perhaps case management can look into finding ways such as perhaps home health nurse or some kind of a delivery system so he has accessed to his  insulin, but at this point we will give him also some insulin R as his insulin drip has not been started yet. Would follow with frequent BMPs and look for anion gap closure. The patient does have hyperkalemia and some peaked T waves. Hopefully with insulin drip the potassium should improve and also we will go ahead and order some calcium and an amp of bicarb and recheck the potassium in a few hours. Hopefully with insulin drip the potassium should improve. He does have acute renal failure likely secondary to intravascular volume depletion, volume loss, severe diabetic ketoacidosis and poor p.o. intake. Would go ahead and give the patient some normal saline bolus and put him on 200 mL of normal saline. He has significant systemic antiinflammatory response syndrome criteria with leukocytosis and tachycardia, but I believe the systemic antiinflammatory response syndrome is likely reactive secondary to severe diabetic ketoacidosis and dehydration. His urinalysis is clear. He does have some cough and I will go ahead and order an x-ray of the chest, but he states that his cough is more chronic and he is a smoker and I would hold on starting him  on some antibiotics at this point before looking at the x-ray. He does have mild hyponatremia, but I think that is more of a pseudotype hyponatremia in the setting of hyperglycemia. In regards to his depression and repetitive behavior would also obtain a psych consult. He was recently started on Celexa per psychiatry, but I do not know if he can afford that and perhaps there are other alternatives. I do not know if theses repetitive diabetic ketoacidosis episodes are secondary to psych issues and bipolar depression and hopefully psych can give [place] some insight into that. Of note, his hemoglobin A1c was 11 from October 19. Overall the patient is critically ill and at high risk of cardiopulmonary complications. The patient is FULL CODE.   TOTAL TIME SPENT: 65 minutes.    ____________________________ Vivien Presto, MD sa:sb D: 08/31/2013 13:24:31 ET T: 08/31/2013 13:47:08 ET JOB#: [id number]  cc: Vivien Presto, MD, <Dictator> Vivien Presto MD ELECTRONICALLY SIGNED 09/10/2013 7:04

## 2015-02-11 NOTE — Discharge Summary (Signed)
PATIENT NAME:  Mark [name], Mark [name] MR#:  [id number] DATE OF BIRTH:  08-19-1986  DATE OF ADMISSION:  10/24/2012 DATE OF DISCHARGE:  10/24/2012  The patient left AMA (against medical advice).  HOSPITAL COURSE: The patient was admitted with a history of feeling really bad, nauseous with epigastric pain and being in DKA. At the moment of the admission, the patient had acute kidney failure, anion gap of 33, CO2 of 9. Received 2 boluses of NS, and he was put on insulin drip. After that, he was transferred to the ICU, where he remained overnight until his anion gap closed and his blood sugar normalized. By the morning of the 3rd, his blood sugar was 80, he was transferred to the floor on his current insulin dosage. He received a dosage of Lantus, which we stopped, and continued his insulin NovoLog. At that moment, since he received the Lantus, we only gave him 50% of his regular dosage, which was originally 55 of 70/30 NovoLog, and we gave him 27. His blood sugar at some point during the day was back to 500. The patient stated that he was having a sore throat. Due to his sore throat, we checked his throat. It looked very good. There were no signs of exudate or infection. It was a little bit irritated on the posterior wall due to all the vomiting that he was doing the previous day. He stated that it was the vomiting that caused him to have sore throat. I offered him to test him for strep throat or other infection, and he said "no, it is not necessary, I don't want to." Then, during the day, he started to feel a little bit bad and started complaining about not being cared for in an appropriate manner because nobody has looked into his throat. As I mentioned above, I looked into his throat in the morning and offered him several treatments, and he refused them.   The patient is bipolar, noncompliant, and always was looking for an exit to the hospital. He decided to leave AMA, so I sent a prescription for his insulin to  the pharmacy, although he was on DKA because he was not able to afford his prescription. I hope he is able to get this filled. I was not able to stop him from going out of the hospital.    ____________________________ Felipa Furnaceoberto Sanchez Gutierrez, MD rsg:OSi D: 10/24/2012 21:03:31 ET T: 10/25/2012 11:47:48 ET JOB#: [id number]  cc: Felipa Furnaceoberto Sanchez Gutierrez, MD, <Dictator> Lethia Donlon Juanda ChanceSANCHEZ GUTIERRE MD ELECTRONICALLY SIGNED 10/31/2012 13:37

## 2015-02-11 NOTE — H&P (Signed)
PATIENT NAME:  Derek JackWILSON, Savion Q MR#:  [id number] DATE OF BIRTH:  12-06-1985  DATE OF ADMISSION:  08/13/2013  PRIMARY CARE PHYSICIAN: None.    HISTORY OF PRESENT ILLNESS:  The patient is a 29 year old Caucasian male with a past medical history significant for history of insulin-dependent diabetes mellitus, likely diabetes mellitus type 1 for the past 4 years, history of neuropathy, bipolar disorder, hypertension, hyperlipidemia, who presents to the hospital with feeling sick over the past 1 week. Apparently he has been seen in the Emergency Room for a few times (Dictation Anomaly)  starting 08/06/2013, October 18, 19th, 22nd and now 08/13/2013.  He tells me that he has been having problems with intermittent nausea and vomiting. His sugars are up and down. He admits to having some lower extremity pain, but he feels that it is likely due to neuropathy. A few days ago, he syncopized and hit his left knee and has been having problems with that. He admits to  feeling feverish and chilly, and fatigued and weak for the past 1 week. Admits to having some weight loss, 10 pounds in the past 1 week. Admits to some blurring of vision. Admits to cough with green-brownish phlegm, as well as wheezes, as well as shortness of breath. He admits to an  arrhythmia and episodes of syncope. He admits to nausea, vomiting, diarrhea, brown-yellow-greenish phlegm with cough, feeling feverish and chilly and not feeling well. He decided to come to the Emergency Room again on the 08/13/2013. In the Emergency Room, he was noted to be in DKA with bicarbonate level of only 11. His blood glucose levels were also running at 550s. He tells me that he does not have much to eat at home and just feels lightheaded and dizzy. He had CT scan of his head done after his fall, 08/17/2013. At that time, it showed just massive ventriculomegaly which was unchanged, but no acute intracranial abnormality. Over the past 1 week, he has been sent back and forth  from home and he would come back to the Emergency Room for further evaluation.    PAST MEDICAL HISTORY:  Significant for ventriculomegaly, for which [facility] and that is in the works; history of kidney stones,  status post lithotripsy in the past; history of diabetes mellitus, insulin-dependent, likely diabetes mellitus type 1 for the past 4 years with neuropathy, but not nephropathy or problems with eyes; history of bipolar disorder; hypertension; hyperlipidemia.   MEDICATIONS: The patient is on Tylenol 325 mg capsules, 2 capsules every 4 hours as needed; insulin 70/30 at 40 units at bedtime and 60 units in the morning. He tells me that he did not take his insulin today, since he did not feel well.   PAST SURGICAL HISTORY: [facility], but not yet done.   ALLERGIES:  SULFA, (Dictation Anomaly)Augmentin, AS WELL AS PENICILLIN.  FAMILY HISTORY: Diabetes mellitus on father's side.  The patient's mother had breast cancer as well as lung cancer.  She is a smoker.  The patient's sister has bone marrow cancer.   SOCIAL HISTORY: The patient is single, no children. He smokes half pack a day for the past 13 years. Lives with this mother. Occasional alcohol use. He used to work in [employer] in 2011, but now he is not working and is helping his mother and waiting for his disability.   REVIEW OF SYSTEMS:  CONSTITUTIONAL:  Positive for feeling feverish and chilly, fatigue  and weak for the past 1 week. Pains in his legs and especially now since he injured his left knee. He has been having pain, weight loss of approximately 10 pounds in the past 1 week, sinus congestion, but he feels this is due to smoking. Admits to having some cough, as well as wheezes and phlegm production with greenish-brownish phlegm, also dyspnea and arrhythmias, episodes of syncope a few days ago, nausea, vomiting, intermittent diarrhea, as well as abdominal pains in the upper  abdomen for the past 1-1/2 weeks. He admits to having black stools 3 days ago, which he describes as diarrheal stool; however, no fresh  rectal bleeding was noted. (Dictation Anomaly)GI tract, no dysuria, nocturia, frequency, incontinence. He admits of having some achiness, and weakness in his muscles, all body,  also (Dictation Anomaly) increased urinary output. He denies any weight gain.  EYES: Denies any double vision, glaucoma or cataracts.  EARS, NOSE, THROAT: Denies any tinnitus, allergies, epistaxis, sinus pain, dentures, difficulty swallowing.  RESPIRATORY:  Denies any hemoptysis, asthma, COPD.  CARDIOVASCULAR: Denies any chest pain, orthopnea, edema, palpitations.  GASTROINTESTINAL: Denies any hematemesis, rectal bleeding, change in bowel habits.  GENITOURINARY: (Dictation Anomaly) Denies dysuria, hematuria, frequency, incontinence.   ENDOCRINE: Denies any polydipsia, thyroid problems, heat or cold intolerance, or thirst.  HEMATOLOGIC: Denies anemia, easy bruising, bleeding, swollen glands.  SKIN: Denies any acne, rashes, change in moles.  MUSCULOSKELETAL: Denies arthritis, cramps, swelling, gout.  NEUROLOGIC: No numbness, epilepsy or tremor.  PSYCHIATRY:  Denies anxiety, insomnia or depression.   PHYSICAL EXAMINATION: VITAL SIGNS: On arrival to the hospital, temperature was 98.3, pulse 118, respirations 18, blood pressure 133/65, saturation was 97% on room air.  GENERAL: This is a well-developed, well-nourished Caucasian male in mild to moderate distress, sitting on the stretcher.  HEENT: Pupils are equal, react to light. Extraocular movements are intact. No icterus or conjunctivitis. He has normal hearing. No pharyngeal erythema. Mucosa is moist. NECK: No masses. Supple, nontender. Thyroid is not enlarged. No adenopathy. No JVD or carotid bruits bilaterally. Full range of motion.  LUNGS: Rhonchi as well as rales were heard bilaterally,  somewhat diminished breath sounds; otherwise, no  wheezing, no labored inspirations or increased effort to breath. No dullness to percussion. Not in overt respiratory distress.  CARDIOVASCULAR: S1, S2 appreciated. No murmurs, gallops or rubs noted. PMI not lateralized.   CHEST:  Nontender to palpation.  EXTREMITIES: 1+ pedal pulses. No lower extremity edema, calf tenderness or cyanosis was noted.  ABDOMEN: Soft, nontender. Bowel sounds were present. No hepatosplenomegaly or masses were noted.  RECTAL: Deferred.  MUSCLE STRENGTH: Able to move all extremities. No cyanosis, degenerative joint disease or kyphosis.  SKIN: Did not reveal any rashes, lesions, erythema, nodularity or induration. Multiple tattoos were noted. The skin was otherwise warm and dry to palpation.  LYMPHATIC: No adenopathy in the cervical region.  NEUROLOGIC: Cranial nerves grossly intact. Sensory is intact. No dysarthria or aphasia. The patient is alert, oriented to time, person and place, cooperative. Memory is good.  PSYCHIATRIC: No significant confusion, agitation or depression noted. The patient is somewhat anxious.   LABORATORY, DIAGNOSTIC AND RADIOLOGIC DATA:  Done in the Emergency Room today, 08/13/2013, showed glucose of 547, sodium 132, bicarbonate 11, lipase level of 46. Liver enzymes were normal. The patient's urine drug screen was negative. White cell count was elevated to 15.3, hemoglobin 14.1, platelet count 339. Red blood cell, size is high at 101.   Urinalysis: Colorless, clear urine. More than 500 glucose. Negative  for bilirubin; 2+ ketones. Specific gravity 1.021; pH was 6.0; negative for blood, protein, nitrites or leukocyte esterase. No red blood cells, less than 1 white blood cell. No bacteria or epithelial cells. Mucus was present.   EKG was not done today; however, it was performed on 08/12/2013. At that time, the patient had sinus tach at 112 beats per minute, otherwise normal EKG. Nonspecific abnormalities were apparently noted in the past.   The patient  had a few x-rays done: Chest x-ray, portable single view, 08/08/2013, revealed no acute cardiopulmonary disease.   CT scan of the head, 08/12/2013, without contrast showed massive ventriculomegaly, which was unchanged. No acute intracranial abnormalities were noted.   The patient had left knee AP and lateral, 08/13/2013, showed no osseous injury in left knee.   ASSESSMENT AND PLAN: 1.  Diabetic ketoacidosis.  Admit the patient to critical care unit for insulin drip. Will continue checking the patient's glucose every 1 hour and BMP every 4 hours, following bicarbonate level. Will initiate the patient on diabetic diet when his bicarbonate level is 18 to 20.  2.  Hyponatremia due to severe dehydration. Continue the patient on IV fluids with normal saline.  3.  Acute bronchitis. Initiate the patient on Levaquin. Get sputum cultures.  4.  Elevated white blood cell count. Follow with antibiotic therapy.  5.  Tobacco abuse. Nicotine replacement therapy discussed with the patient for approximately 4 minutes.  6.  History of hypertension. The patient is hypotensive at this time. We will not initiate any blood pressure medication.  7.  History of hyperlipidemia. We will check the patient's lipid panel.  8.  History of bipolar disorder. The patient would benefit from psych evaluation. He is not on any (Dictation Anomaly) psychiatric medications.   9. Syncope. Very likely due to acidosis, as well as possibly hypotension. Will follow the patient and will get orthostatic vital signs.  TIME SPENT: One hour.    ____________________________ Katharina Caper, MD rv:cc D: 08/13/2013 17:38:00 ET T: 08/13/2013 18:18:20 ET JOB#: [id number]  cc: Katharina Caper, MD, <Dictator>   Elly Haffey MD ELECTRONICALLY SIGNED 09/16/2013 17:30

## 2015-02-11 NOTE — Consult Note (Signed)
Brief Consult Note: Diagnosis: major depression.   Patient was seen by consultant.   Consult note dictated.   Orders entered.   Comments: Psychiatry: Patient seen. Note dictated and orders entered. Patient is experienceing multiple symptoms of depression. Not, however, suicidal. Has major life stresses. Will start celexa 20mg  a day for depression. Advise follow up referral to [facility] or [facility].  Electronic Signatures: Audery Amellapacs, Ashika Apuzzo T (MD)  (Signed 24-Oct-14 19:06)  Authored: Brief Consult Note   Last Updated: 24-Oct-14 19:06 by Audery Amellapacs, Quantel Mcinturff T (MD)

## 2015-02-11 NOTE — Consult Note (Signed)
Brief Consult Note: Diagnosis: major depression.   Patient was seen by consultant.   Consult note dictated.   Recommend further assessment or treatment.   Comments: [name] returns for another DKA. He has no money to buy insulin or antidepressant. He has no income and $50/month for his medication is more than the family can afford. Patient is experienceing multiple symptoms of depression but is not suicidal.   PLAN: 1. No antidepressant is going to solve his problems and will add to the cost.   2. The patient believes that next month his financial situation will improve.   3. I will follow along.  Electronic Signatures: Kristine LineaPucilowska, Jolanta (MD)  (Signed [id number]-Nov-14 18:08)  Authored: Brief Consult Note   Last Updated: 10-Nov-14 18:08 by Kristine LineaPucilowska, Jolanta (MD)

## 2015-02-11 NOTE — Discharge Summary (Signed)
PATIENT NAME:  Mark [name], Mark [name] MR#:  [id number] DATE OF BIRTH:  1986/02/15  DATE OF ADMISSION:  08/31/2013 DATE OF DISCHARGE:  09/01/2013  PRIMARY CARE PHYSICIAN: None local.  CONSULTING PHYSICIAN: Dr. Jennet MaduroPucilowska, behavior medicine physician.   DISCHARGE DIAGNOSES:   1.  Diabetic ketoacidosis.  2.  Acute renal failure. 3.  Leukocytosis due to reaction.  4.  Major depressive episode, severe and recurrent.   CODE STATUS: Full code.   CONDITION: Stable.   HOME MEDICATIONS:  1.  Acetaminophen 325 mg p.o. 2 tablets every 4 hours p.r.n.  2.  NPH 70/30, 45 units subcutaneous at bedtime, 65 units subcutaneous in the morning.   DIET: Low-sodium, low-fat, low-cholesterol diet.   ACTIVITY: As tolerated.   FOLLOWUP CARE: Follow up with PCP within 1 to 2 weeks. Follow up with Dr. Jennet MaduroPucilowska within 1 to 2 weeks.   REASON FOR ADMISSION: Nausea, vomiting, not feeling good.   HOSPITAL COURSE: The patient is a 29 year old Caucasian male with a history of diabetes 1, neuropathy, bipolar disorder and recurrent DKA, presented to the ED with nausea, vomiting and not feeling well. The patient came to the hospital the day before this admission and noted to have DKA. The patient was treated in the ED and was discharged to home with insulin, but the patient forgot to get insulin. The patient came back for nausea, vomiting and not feeling good. The patient was diagnosed with DKA. For detailed history and physical examination, please refer to the admission note dictated by Dr. Jacques NavyAhmadzia. On admission date, the patient's laboratory data showed glucose of 680, BUN 23, creatinine 1.73, sodium 130, potassium 5.5, bicarbonate 10. White count 25.4. Urinalysis was negative. The patient was admitted for DKA and acute renal failure. After admission, the patient was placed on insulin drip with aggressive  IV fluid support. The patient's anion gap decreased to normal range, so insulin drip was discontinued early this  morning. The patient was given Novolin 70/30, 25 units in the morning. The patient's last blood sugar was 75. The patient has no symptoms. He ate breakfast well and no symptoms. I advised the patient diet control and compliance to medication.   Dr. Jennet MaduroPucilowska evaluated the patient. She suggested the patient has major depressive episode, severe and recurrent, and also the patient has bipolar mood, severe and recurrent. She suggested no antidepressant is going to solve this problem and will only add to the cost.   The patient also has hydrocephalus, for which the [facility] 3 to 4 months.   The patient has no complaints. Vital signs are stable. He is clinically stable. He will be discharged to home today. I discussed the patient's discharge plan with the patient, nurse and case manager.   TIME SPENT: About 37 minutes.   ____________________________ Mark PollackQing Amory Simonetti, MD qc:jcm D: 09/01/2013 13:26:25 ET T: 09/01/2013 14:39:15 ET JOB#: [id number]  cc: Mark PollackQing Naoki Migliaccio, MD, <Dictator> Mark PollackQING Kurtis Anastasia MD ELECTRONICALLY SIGNED 09/03/2013 16:06

## 2015-02-11 NOTE — Discharge Summary (Signed)
PATIENT NAME:  Mark [name], Mark [name] MR#:  [id number] DATE OF BIRTH:  04/04/1986  PRIMARY CARE PHYSICIAN: Nonlocal.  DISCHARGE DIAGNOSES: Acute diabetic ketoacidosis, uncontrolled diabetes, and acute bronchitis.   CONDITION: Stable.   CODE STATUS: FULL CODE.  DATE  HOME MEDICATIONS: Humulin 70/30 subcu 60 units in the morning, 45 units at bedtime.   DIET: ADA diet.   ACTIVITY: As tolerated.   FOLLOW-UP CARE: Follow with PCP within 1 to 2 weeks.   REASON FOR ADMISSION: Intractable nausea and vomiting.   HOSPITAL COURSE: The patient is a [age] year old Caucasian male with a history of diabetes,  DKA, medical noncompliance, tobacco abuse, who came to the ED for not feeling well for the past 2 days. In addition, the patient has shortness of breath with nonproductive cough. Also has nausea, vomiting. In the ED, the patient noted to have severe hyperglycemia and DKA.   For detailed history and physical examination, please refer to the admission note dictated by Dr. Cherlynn KaiserSainani.   LABORATORY DATA ON ADMISSION DATE: Glucose 608, BUN 17, creatinine 1.1, sodium 129, potassium 5.1, chloride 95, bicarb 11, anion gap 23, troponin less than 0.02. WBC 15.4, hemoglobin 15. ABG showed pH 7.17, pCO2 25, pO2 153.   Chest x-ray showed reactive air disease of COPD with a superimposed acute bronchitis.   ASSESSMENT AND PLAN: Diabetic ketoacidosis and uncontrolled diabetes. The patient was admitted to critical care unit with insulin drip. In addition, the patient has been treated with aggressive IV fluid support. The patient's anion gap close to normal range at 8. Sodium increased to 133. Since the patient's anion gap is close to normal range and blood sugar is under controlled with insulin drip, the patient's insulin drip was discontinued. The patient was treated with Lantus 59 units last night. The patient's blood sugar today is 167, 235, and 245. In addition to Lantus, the patient has been treated with sliding scale. The  patient tolerated diet well. No complaints. Vital signs are stable. Also WBC decreased to 8.5.   DISPOSITION: The patient is clinically stable and will be discharged to home today.   I discussed the patient's discharge plan with the patient, nurse, and case manager; in addition, I have advised the patient's compliance to his diabetes medication.   TIME SPENT: About 37 minutes.    ____________________________ Shaune PollackQing Sharmin Foulk, MD qc:np D: 07/10/2013 14:25:45 ET T: 07/10/2013 15:39:55 ET JOB#: [id number]  cc: Shaune PollackQing Veleta Yamamoto, MD, <Dictator> Shaune PollackQING Lolly Glaus MD ELECTRONICALLY SIGNED 07/14/2013 11:17

## 2015-02-11 NOTE — H&P (Signed)
PATIENT NAME:  Mark [name], [name] MR#:  [id number] DATE OF BIRTH:  Feb 10, 1986  DATE OF ADMISSION:  11/28/2012  REFERRING PHYSICIAN: Dr. Brien MatesBraud.   PRIMARY CARE PHYSICIAN: None.   CHIEF COMPLAINT: Nausea, vomiting, high sugars.   HISTORY OF PRESENT ILLNESS: The patient is a 29 year old Caucasian male with uncontrolled diabetes and multiple DKAs in the past, well known to our service with multiple hospitalizations in the last year for DKA noncompliance with insulin regimen, history of MRSA,  bipolar, who presents with nausea, vomiting, and elevated sugars. The patient stated that four days ago his glucometer battery ran out and it has been pretty much nonfunctional and he ran out of his insulin three days ago. Sugars, he states, were pretty high and that is why he came in. He has been having nausea and vomiting today of up to 15 times. He started to also have some indigestion today after vomiting started. He has had polyuria, polydipsia, and chills without any fevers. On arrival he was noted to have blood sugars of more than 600, tachycardic, and an anion gap metabolic acidosis, renal failure, and leukocytosis; and hospitalist service was contacted for further evaluation and management.   PAST MEDICAL HISTORY:  1.  Diabetes with multiple admissions for DKA.  2.  Ongoing tobacco abuse.  3.  Hyperlipidemia.  4.  History of elevated blood pressure per chart.  5.  History of methicillin-resistant Staphylococcus aureus.  6.  Boils and abscesses in the past.  7.  History of bipolar disorder not on any medications.  8.  History of nephrolithiasis status post lithotripsy.  9.  History of severe hydrocephalus, which he states [facility] November for further followup, which he has not followed up since, but he is planning on upon discharge.   ALLERGIES TO Augmentin, penicillin, and sulfa.   CURRENT MEDICATIONS: Insulin 70/30 fifty-five units in the morning and 35 units at  bedtime.   SOCIAL HISTORY: Still smokes about 1/2 pack a day. No alcohol or drug use.   FAMILY HISTORY: Positive for breast cancer in mom as well as coronary artery disease; sister with diabetes.   Review of systems:  CONSTITUTIONAL: No fever. Positive for fatigue, weakness, and some chills.  EYES: Positive for chronic blurry vision.  EARS, NOSE, AND THROAT: No tinnitus or hearing loss. No snoring.  RESPIRATORY: Dry cough without wheezing, shortness of breath or chronic obstructive pulmonary disease.  CARDIOVASCULAR: Denies chest pain, orthopnea or swelling in the legs.  GASTROINTESTINAL: Positive for nausea and vomiting as above. Epigastric, abdominal pain, and some indigestion today.  GENITOURINARY: Denies dysuria, hematuria.  No incontinence.  ENDOCRINE: Positive for polyuria and nocturia.  HEME/LYMPH: No anemia or easy bruising.  SKIN: No new rashes.  NEUROLOGIC: Global weakness.  MUSCULOSKELETAL: Has been having muscle pains in the lower extremities.  PSYCHIATRIC: Is bipolar, not taking any medications.   PHYSICAL EXAMINATION:  VITAL SIGNS: Temperature on arrival was 97.8, pulse rate of 123, respiratory rate 22, blood pressure 117/72, oxygen sat 100% on room air.  GENERAL: The patient is a well-developed Caucasian male lying in bed, somewhat belligerent with the staff.  HEAD, EYES, EARS, NOSE, AND THROAT: Normocephalic, atraumatic. Pupils are equal and reactive. Anicteric sclerae. Extraocular muscles intact. Very dry mucous membranes and very poor dentition.  NECK: Supple. No thyroid tenderness or cervical lymphadenopathy.  CARDIOVASCULAR: S1, S2, tachycardic. No murmurs, rubs, or gallops.  LUNGS: Clear to auscultation without wheezing or rhonchi. Good air entry.  ABDOMEN: Soft.  Positive for tenderness, more generalized, but increased epigastric. Positive bowel sounds in all quadrants. No organomegaly appreciated.  EXTREMITIES: No significant lower extremity edema.  SKIN: No  obvious rashes.  MUSCULOSKELETAL: Has some point tenderness to muscle palpitations in the thigh area.  NEUROLOGIC: Cranial nerves II through XII grossly intact. Strength is 5/5 in all extremities. Sensation is intact to light touch.  PSYCH: Awake, alert, oriented, cooperative, anxious, belligerent to the staff.   labS: Initial glucose over 600.  Of note, he has had elevated blood sugars on January 3rd, as well.  On the BMP, blood sugar was 695, BUN was 27, creatinine 1.52, sodium 127. Chloride 87, serum CO2 of 10, anion gap 30, alkaline phosphatase 170, AST 13, total protein 9.2. WBC 26.9, hemoglobin 16.4, platelets 366. ABG showing pH of 7.1, pCO2 of 23, pO2 of 103.  Chest x-ray pending. UA pending.   ASSESSMENT AND PLAN: We have a 29 year old Caucasian male with multiple admissions for diabetic ketoacidosis in the setting of insulin noncompliance, diabetes type 1, ongoing tobacco abuse, history of methicillin-resistant Staphylococcus aureus, abscesses and boils in the past with severe hydrocephalus noted first in November, who then was transferred to [facility] at that time for further care, who presents with recurrent diabetic ketoacidosis. The patient has ran out of insulin again and presents with anion gap metabolic acidosis, a significant elevation of blood sugars, renal failure. At this point, we would admit the patient to the Critical Care Unit, give her one time dose of insulin aspart and resume insulin drip with aggressive IV fluid resuscitation. He has had 3 liters of IV fluids and is still tachycardic. Would check frequent BMPs, monitor electrolytes, frequent blood glucose checks, and replete lytes as needed. Would follow the DKA protocol pathway and change the fluids to D5 one half once blood sugars are less than 250 or so. He does have severe dehydration based on exam as well as hemoconcentration. We would start normal saline at 200 mL per hour and p.r.n. bolus if needed and monitor renal function. The  patient also has had some gastrointestinal losses secondary to nausea and vomiting. He does have SIRS criteria for tachycardia and leukocytosis but no clear source at this point. Although he has a cough, he has good oxygen sats.  Cough is dry. He has no shortness of breath.  No urinary tract infection or skin issues at this point. We would also check an x-ray of the chest and get a manual differential. Of note, he has not followed up with [facility] for his severe hydronephrosis seen on a CAT scan in November.  He states he has dizzy spells but neurologically he is intact at this point. I have discussed proper followup with him. Initially, he said he will follow up within a month, then two months, and then later said he will follow up after discharge. To me, he does not have a clear plan and we have to see if the hydrocephalus is improving. We would obtain an MRI of the brain and get frequent neuro checks. I strongly encouraged followup with [facility]. He still smokes tobacco and he was counseled for three minutes and he does want a patch, which we will order. He has not been started on an bipolar medications but he stated that he has been following with the social services and perhaps medications might be started soon for him. We will start him on heparin for deep vein thrombosis prophylaxis. The patient is full code.  Total Time Spent: Fifty-five minutes of critical care time.     ____________________________ Krystal Eaton, MD sa:th D: 11/28/2012 20:26:18 ET T: 11/29/2012 09:48:16 ET JOB#: [id number]  cc: Krystal Eaton, MD, <Dictator> Krystal Eaton MD ELECTRONICALLY SIGNED 12/08/2012 15:48

## 2015-02-11 NOTE — Consult Note (Signed)
PATIENT NAME:  Mark [name], Mark [name] MR#:  [id number] DATE OF BIRTH:  1986-01-24  DATE OF CONSULTATION:  08/14/2013  CONSULTING PHYSICIAN:  Audery Amel, MD  IDENTIFYING INFORMATION AND REASON FOR CONSULT: [name] man with a history of diabetes, in the hospital for diabetic ketoacidosis and bronchitis. Consultation for possibility of bipolar disorder.   HISTORY OF PRESENT ILLNESS:  Information obtained from the patient and the chart. The patient is here in the hospital for the latest in a series of admissions with diabetic ketoacidosis. He reports that he has a lot of difficulty controlling his blood sugars. He does manage to get his insulin, but his food supply at home is inconsistent, and despite his best effort, he is still having occasions with spikes up into the 600s. Additionally, he reports that his mood stays down and depressed most of the time. He has a sense of hopelessness about his life. Very frustrated about his medical problems. Also feels overwhelmed at the stresses, including the fact that his mother is dying of cancer, he has no current income, he feels like his family will not help him with anything. He sleeps poorly and his energy level is very low. Takes little interest in much of anything. Has little social activity or interaction outside of his mother and his dog. He denies any suicidal ideation, although he says at times he wishes that he were dead and feels like other people would be better off without him. He denies any psychotic symptoms. The patient says that he does have an anger problem and sometimes will lose his temper and break things. During these episodes, he feels bad consistently.  He does not describe any episodes of euphoria or elevated mood or anything that sounds like a mania. He has cut way back on his substance use, and says he has not smoked pot in several months. He is not currently taking any psychiatric medicine or getting any psychiatric treatment.   PAST  PSYCHIATRIC HISTORY: At age 89, he was hospitalized for wild behavior. He was diagnosed at some point with bipolar disorder when he was an adolescent and was treated with antidepressants and also with Risperdal. He was not very compliant and says he never felt medicines helped him. He has not had any psychiatric hospitalizations or treatment since being an adult. No history of psychotic symptoms. The diagnosis of bipolar disorder seems to be rather vaguely arrived at and only when he was an adolescent.   SUBSTANCE ABUSE HISTORY: As an adolescent, he smoked a lot of pot and had significant substance abuse, but says he has cut way back on it. Does not drink and does not use drugs regularly now.   PAST MEDICAL HISTORY: Insulin-dependent diabetes with multiple episodes of DKA. Recent bronchitis.   SOCIAL HISTORY: Does not have an income. He has applied for disability, but has not gotten it yet. Lives with his mother, who is very sick with cancer. He is the youngest of 4 children. The others are all daughters. He has no contact with his biological father. He does not have a girlfriend, is not in any relationship.   CURRENT MEDICATIONS: Levaquin 750 mg IV q.24 hours, Protonix 40 mg IV every day, insulin drip and standing insulin.   ALLERGIES: AUGMENTIN, PENICILLIN AND SULFA DRUGS.   REVIEW OF SYSTEMS: Depressed mood, sadness, feeling of hopelessness. No active suicidal ideation. No hallucinations. Feels tired, run down, very hungry currently.   MENTAL STATUS EXAMINATION: Disheveled gentleman, looks younger than his stated  age, cooperative with the interview. Good eye contact, normal psychomotor activity. Speech normal in rate, tone and volume. Thoughts are lucid. He shows evidence of an ability to take care of himself and manage normal phone calls and to advocate for himself, which is all to the good. Mood is stated as being kind of depressed. Thoughts generally are lucid. No sign of delusional thinking.  Denies auditory or visual hallucinations. Passive thoughts of death, but no active plan to harm himself or wish to kill himself. No homicidal ideation. Judgment and insight are pretty good. Intelligence: Probably average.   LABORATORY RESULTS: Lots of labs done due to his medical condition. Glucose obviously very elevated. He had a drug screen done a couple of days ago that was all negative.   ASSESSMENT: [name] man with multiple symptoms of major depression. Major stresses including illness and lots of social problems. No evidence of psychosis. Past diagnosis of bipolar disorder, no longer should be considered valid in the transition from early adolescence to adulthood. Does not give a history that would be suggestive of bipolar disorder as an adult. Diagnosis currently  probably major depression.   TREATMENT PLAN: I have suggested to him that we start an antidepressant, such as citalopram 20 mg a day to start with. Counseling done including education about how long it takes antidepressants to work and potential side effects. Encouragement to him to continue applying for disability. Supportive therapy. No need for suicide precautions. I am glad to see that social work was involved with him. Anything that can be done to help with his social situation and financial situation would be a real boon.   DIAGNOSIS, PRINCIPAL AND PRIMARY: AXIS I:  Major depressive episode, severe, recurrent.   SECONDARY DIAGNOSES: AXIS I: No further.  AXIS II: No diagnosis.  AXIS III: Diabetic ketoacidosis, insulin-dependent diabetes, bronchitis.  AXIS IV: Severe, financial problems and illness of his mother and of himself.  AXIS V: Functioning at time of evaluation, 33.     ____________________________ Audery AmelJohn T. Clapacs, MD jtc:dmm D: 08/14/2013 19:15:06 ET T: 08/14/2013 21:04:32 ET JOB#: [id number]  cc: Audery AmelJohn T. Clapacs, MD, <Dictator> Audery AmelJOHN T CLAPACS MD ELECTRONICALLY SIGNED 08/14/2013 22:39

## 2015-02-11 NOTE — H&P (Signed)
PATIENT NAME:  Mark [name], Mark [name] MR#:  [id number] DATE OF BIRTH:  Mar 13, 1986  DATE OF ADMISSION:  08/08/2013  REFERRING PHYSICIAN: Dr. Darnelle Catalan.   FAMILY PHYSICIAN: Nonlocal.   REASON FOR ADMISSION: Diabetic ketoacidosis.   HISTORY OF PRESENT ILLNESS: The patient is a 29 year old male with a history of type 1 diabetes, bipolar disorder and nephrolithiasis who presents to the Emergency Room with nausea, vomiting, headache and severe mouth pain. In the Emergency Room, the patient was noted to be in DKA with a severe mouth infection. He is now admitted for further evaluation.   PAST MEDICAL HISTORY: 1.  Type 1 diabetes, on insulin.  2.  Chronic back pain.  3.  Bipolar disorder. 4.  Hyperlipidemia.  5.  Benign hypertension.  6.  History of MRSA. 7.  Nephrolithiasis.   MEDICATIONS ON ADMISSION: Humulin 70/30, 60 units subcutaneous q.a.m. and 45 units subcutaneous q.p.m.   ALLERGIES: AUGMENTIN, SULFA AND PENICILLIN.   SOCIAL HISTORY: The patient has a long-standing history of smoking and smokes 1 pack per day. Denies alcohol abuse.   FAMILY HISTORY: Positive for diabetes, hypertension, coronary artery disease, seizures and stroke.   REVIEW OF SYSTEMS:    CONSTITUTIONAL: No fever or change in weight.  EYES: No blurred or double vision. No glaucoma.  EARS, NOSE, THROAT: No tinnitus or hearing loss. No nasal discharge or bleeding. No difficulty swallowing.  RESPIRATORY: Some cough but no wheezing. Denies hemoptysis. No painful respiration.  CARDIOVASCULAR: No chest pain or orthopnea. No palpitations or syncope.  GASTROINTESTINAL: No abdominal pain or diarrhea. No change in bowel habits.  GENITOURINARY: No dysuria or hematuria. No incontinence.  ENDOCRINE: No polyuria or polydipsia. No heat or cold intolerance.  HEMATOLOGIC: The patient denies anemia, easy bruising or bleeding.  LYMPHATIC: No swollen glands.  MUSCULOSKELETAL: The patient denies pain in his neck, back, shoulders, knees or  hips. No gout.  NEUROLOGIC: No numbness or weakness. Denies migraines or stroke.  PSYCHIATRIC: The patient denies anxiety, insomnia or depression.   PHYSICAL EXAMINATION: GENERAL: The patient is in no acute distress.  VITAL SIGNS: Remarkable for a blood pressure of 115/67 with a heart rate of 95, respiratory rate of 20. Temperature 98.  HEENT: Normocephalic, atraumatic. Pupils equally round and reactive to light and accommodation. Extraocular movements are intact. Sclerae are anicteric. Conjunctivae are clear. Oropharynx is clear, but severely poor dentition is noted with abscesses noted on the left lower teeth.  NECK: Supple, with some shotty cervical adenopathy.  LUNGS: Scattered rhonchi without wheezes. No rales. No dullness. Respiratory effort is normal.  CARDIAC: Rapid rate with a regular rhythm. Normal S1 and S2. No significant rubs, murmurs or gallops. PMI is nondisplaced. Chest wall is nontender.  ABDOMEN: Soft, nontender, with normoactive bowel sounds. No organomegaly or masses were appreciated. No hernias or bruits were noted.  EXTREMITIES: Without clubbing, cyanosis or edema. Pulses were 2+ bilaterally.  SKIN: Warm and dry without rash or lesions.  NEUROLOGIC: Cranial nerves II through XII grossly intact. Deep tendon reflexes were symmetric. Motor and sensory exams nonfocal.  PSYCHIATRIC: Revealed a patient who is alert and oriented to person, place and time. Was cooperative and used good judgment.   LABORATORY DATA: Glucose was 639 with a BUN of 12, creatinine 1.15 with a sodium of 130 and a potassium of 4.7, with GFR of greater than 60 and an anion gap of 19. Urinalysis was unremarkable.   ASSESSMENT: 1.  Diabetic ketoacidosis with anion gap.  2.  Hyponatremia.  3.  Severe  dental infection.  4.  Mouth pain.  5.  Bipolar disorder.  6.  Benign hypertension, stable.  7.  History of nephrolithiasis.   PLAN: The patient will be admitted to the Intensive Care Unit on an insulin  drip and IV normal saline. Will follow his sugars according to the protocol and wean the drip as tolerated. Will send off blood cultures and begin IV antibiotics with Magic mouthwash. Will obtain a dental consult because of his mouth infection. Will also obtain psych consult because of his history of bipolar disorder. Will use Ativan in the meantime as needed for agitation. Follow up routine labs in the morning. Clear liquid diet for now. Further treatment and evaluation will depend upon the patient's progress.   Total time spent on this patient was 50 minutes.    ____________________________ Duane LopeJeffrey D. Judithann SheenSparks, MD jds:jm D: 08/08/2013 19:04:55 ET T: 08/08/2013 20:31:14 ET JOB#: [id number]  cc: Duane LopeJeffrey D. Judithann SheenSparks, MD, <Dictator> Katarzyna Wolven Rodena Medin Brenson Hartman MD ELECTRONICALLY SIGNED 08/10/2013 8:12

## 2015-02-11 NOTE — H&P (Signed)
PATIENT NAME:  Mark [name], Mark [name] MR#:  [id number] DATE OF BIRTH:  05-08-1986  DATE OF ADMISSION:  10/12/2013  REASON FOR ADMISSION: Severe nausea, vomiting; patient with DKA.    REFERRING PHYSICIAN: Dr. Janalyn Harderavid Kaminski.   HISTORY OF PRESENT ILLNESS: This is a 29 year old gentleman who has been admitted on many multiple occasions due to DKA. The patient keeps telling me that he is compliant with treatment, and he has been using his insulin 70/30, 45 units at night and 65 units in the morning, but he continues to have these issues. He denies any signs of infection. He denies any alcohol use, drug use. He states he is being his normal self. He has not been dehydrated up until recently.  The patient states that within a month he has been feeling bad, nauseous, but everything started to get worse up until the last 3 days. He has been vomiting multiple occasions, he states more than 20 a day, and today has been again more than 20. The patient says he is not able to hold anything down, not solids. Fluids stay down for a little while, and then he is vomiting within 10 to 20 minutes. The patient denies any bloody stools. He denies any significant fever or cough. No dysuria or hematuria, and he states that his heart rate is just pounding. He has been breathing pretty fast, and he noticed that, and this is likely secondary to his acidosis. The patient is admitted for treatment of his DKA.   REVIEW OF SYSTEMS:   CONSTITUTIONAL: No fever. Positive fatigue. Positive weakness. No significant weight loss or weight gain.  EYES: No double vision. Positive blurry vision. No inflammation.  EARS, NOSE, THROAT: No difficulty swallowing or tinnitus.  RESPIRATORY: No cough. No wheezing. No hemoptysis. Positive tachypnea. The patient states that he is a smoker. He smokes 1/2 to 1 pack a day. Smoking cessation has been given to the patient for over 3 minutes. The patient states he is not ready to quit. He does not have any  significant COPD that he can tell.  CARDIOVASCULAR: No chest pain, orthopnea. Positive palpitations. Positive tachycardia.  GASTROINTESTINAL: Positive nausea, positive vomiting, as mentioned above. Positive mild epigastric pain. No guarding. No jaundice. No rectal bleeding or melena.  GENITOURINARY: No dysuria, hematuria, changes in frequency. He says he always urinates a lot, always has polyuria.  ENDOCRINE: No polyuria, polydipsia, polyphagia, cold or heat intolerance.  HEMATOLOGIC AND LYMPHATIC: No anemia, easy bruising or bleeding.  SKIN: No rashes or petechiae.  MUSCULOSKELETAL: No significant neck pain or back pain.  NEUROLOGIC: No numbness or tingling, or CVA.  PSYCHIATRIC: No insomnia or depression. No suicide thoughts.   PAST MEDICAL HISTORY: 1.  Type 1 diabetes.  2.  Hypertension.  3.  Hyperlipidemia.  4.  Bipolar disorder.   FAMILY HISTORY: Positive for diabetes in multiple members of his family.   SOCIAL HISTORY: The patient smokes 1/2 to 1 pack a day. He has been smoking since his early teenage years. He denies any alcohol. He says that he drinks one beer once a month. He lives with his mother.   PAST SURGICAL HISTORY: None.   ALLERGIES: AUGMENTIN, PENICILLIN, and SULFA DRUGS.   HOME MEDICATIONS: 70/30, 65 units in the morning, 45 units at night. He buys over-the-counter insulin at [employer].   PHYSICAL EXAMINATION: VITAL SIGNS: Blood pressure is 112/70, pulse is 125 to 135, respirations 22 to 24, temperature 98, pulse ox 95% on room air.  GENERAL: The patient is  alert, oriented x 3. Mild distress due to DKA, tachypneic.  HEENT:  His pupils are equal and reactive. Extraocular movements are intact. Mucosa dry. Anicteric sclerae, pink conjunctivae. No oral lesions. No oropharyngeal exudates.  NECK: Supple. No JVD. No thyromegaly. No adenopathy. No carotid bruits.  CARDIOVASCULAR: Tachycardic. No murmurs, rubs or gallops. Regular rate and rhythm. The patient has no  displacement of PMI. No tenderness to palpation of wall  LUNGS: Clear, without any wheezing or crepitus. No use of accessory muscles.  ABDOMEN: Soft. Mildly tender to palpation in epigastric area, but no rebound or guarding. No hepatosplenomegaly.  GENITAL: Exam deferred.  EXTREMITIES: No edema, cyanosis or clubbing.  VASCULAR: Pulses +2. Capillary refill less than 3.  SKIN: No rashes, petechiae, or new lesions.  LYMPHATIC: Negative for lymphadenopathy in neck or supraclavicular areas.  NEUROLOGIC: Cranial nerves II to XII intact. No focal findings. His strength is 5/5 in 4 extremities.  PSYCHIATRIC: No significant depression or agitation.   RESULTS:  Blood sugar is 717, BUN 24, creatinine 1.54, sodium is 130, potassium 5.6, CO2 is 7, anion gap is 29. His total protein is 8.6, AST is 143. Other LFTs within normal limits. White count is 1700, hemoglobin 14.6, platelet count 399. Urinalysis: No signs of urinary tract infection. pH is 7.02, pCO2 is 19, PO2 is 115. His chest x-ray did not show any significant abnormalities. His EKG shows sinus tachycardia, no significant ST depression or elevation.   ASSESSMENT AND PLAN: This is a 29 year old gentleman who has been admitted on multiple occasions with diabetic ketoacidosis. At this moment, his DKA is severe. He is severely acidotic, with a pH of 7 and a CO2 serum less than 7. We are going to continue IV fluid resuscitation. I am going to increase his IV fluids to 200 mL an hour. Put him on an insulin drip for DKA protocol. His potassium is 5.6. At this moment, we are going to just repeat it in the next hour. No need for correction at this moment unless it persists above 5. The patient is going to be monitored closely under telemetry. In CCU, he has significant tachycardia, he looks very dehydrated, needs a lot of volume expansion. Add on IV fluids with bicarbonate, if his serum CO2 remains low, despite expansion of volume. His white count is slightly  elevated at 17.7, but this is likely due to hemoconcentration, and DKA. The patient was given smoking cessation counseling for over 3 minutes. The patient is critically ill. I am going to admit him to the CCU. Critical care time over 50 minutes.    ____________________________ Felipa Furnace, MD rsg:mr D: 10/12/2013 19:19:27 ET T: 10/12/2013 20:03:43 ET JOB#: [id number]  cc: Felipa Furnace, MD, <Dictator> Kalinda Romaniello Juanda Chance MD ELECTRONICALLY SIGNED 10/18/2013 12:48

## 2015-02-11 NOTE — Discharge Summary (Signed)
PATIENT NAME:  Mark [name], Mark [name] MR#:  [id number] DATE OF BIRTH:  08-May-1986  DATE OF ADMISSION:  09/15/2013 DATE OF DISCHARGE:  09/16/2013  ADMISSION DIAGNOSIS: Hyperosmolar nonketotic hyperglycemia.   DISCHARGE DIAGNOSES:  1.  Hyperosmolar nonketotic hyperglycemia.  2.  Hyponatremia.  3.  Tobacco abuse.   CONSULTATIONS: None.   LABORATORIES AT DISCHARGE: Sodium 135, potassium 3.7, chloride 103, bicarb 26, BUN 8, creatinine 0.72. Glucose is 178.   HOSPITAL COURSE: This is a 29 year old male who was brought in for hyperosmolar nonketotic hyperglycemia. For further details, please refer to the H and P.  1.  Hyperosmolar nonketotic hyperglycemia. The patient was given 3 liters of fluids, placed on an insulin drip. His glucose was monitored carefully. As he improved, his insulin drip was stopped. The IV fluids were also discontinued. He says that he ran out of his outpatient medications. He will restart on his outpatient medications. He also says he has a primary care physician, but he does not recall the name, so he will follow up with that MD as an outpatient.  2.  Hyponatremia, secondary to problem #1, which improved.  3.  Tobacco abuse. The patient was counseled and he did not want to quit. He does not want a nicotine patch.  4.  Acute renal failure from dehydration, which improved with IV fluids.   DISCHARGE MEDICATIONS: 70/30 at 45 units in the morning and 65 units at night.   DISCHARGE DIET: ADA diet.   DISCHARGE ACTIVITY: As tolerated.   DISCHARGE FOLLOW-UP: The patient says he has a primary care physician. He does not recall the name. He will follow up with that physician.   TIME SPENT: Approximately 35 minutes.   The patient is medically stable for discharge.    ____________________________ Renetta Suman P. Juliene PinaMody, MD spm:np D: 09/16/2013 14:11:55 ET T: 09/16/2013 15:50:09 ET JOB#: [id number]  cc: Tiamarie Furnari P. Juliene PinaMody, MD, <Dictator> Janyth ContesSITAL P Zaquan Duffner MD ELECTRONICALLY SIGNED 09/16/2013  21:23

## 2015-02-12 NOTE — Discharge Summary (Signed)
PATIENT NAME:  Mark [name], Ramesh Q MR#:  [id number] DATE OF BIRTH:  10-19-1986  DATE OF ADMISSION:  07/15/2014 DATE OF DISCHARGE:  07/15/2014  ADMISSION DIAGNOSIS: Diabetic ketoacidosis.   DISCHARGE DIAGNOSES:  1.  Diabetic ketoacidosis.  2.  Noncompliance.  3.  Leukocytosis.    PERTINENT LABORATORIES AT DISCHARGE:  1.  Sodium 132, potassium 3.6, chloride 101, bicarbonate 24, BUN 14, creatinine 1.16, glucose is 158.  2.  White blood cells 19.5, hemoglobin 12.4, hematocrit 38. Platelets are 290,000.  3.  Chest x-ray showed no cardiopulmonary disease and no change.   HOSPITAL COURSE: This is a 29 year old male who presented to the hospital on September 24 in DKA. For further details please refer to the H and P.   1.  DKA. The patient has had several hospitalizations for DKA, he was treated with DKA protocol using the CCU protocol. The DKA was secondary to medical noncompliance. His DKA has resolved as mentioned above. He was placed back on his outpatient dose of medications as well as diet.  2.  Leukocytosis from DKA, which is improved.  3.  Noncompliance. We have encouraged the patient to be compliant with his medications and take initiative for his own health care. I talked to the patient's mother as well. Case management approached me and said that the patient has utilized all of his resources here at the hospital, we have help him several times in the past, he does not follow up with any of the scheduled appointment that the case managers have diligently scheduled for him. The patient himself says that he did mail in some information regarding assistance with medications. I asked him that he should follow up on this and call the place that he actually mailed this information to and to see where he is on the waiting list for medical assistance.   DISCHARGE MEDICATIONS:  Insulin 70/30, 65 units at bedtime and 45 units in the morning.  DISCHARGE DIET: ADA diet.   DISCHARGE ACTIVITY: As  tolerated.   DISCHARGE FOLLOWUP: The patient will need to find a primary care physician and follow up within the next 1-2 weeks.  Again medical noncompliance is a big issue for this patient. I reiterated the fact that the patient should take initiative of his own health care  and should follow up with a physician. The patient is stable for discharge.   TIME SPENT: Approximately 45 minutes.    ____________________________ Janyth ContesSital P. Juliene PinaMody, MD spm:bu D: 07/15/2014 11:45:00 ET T: 07/15/2014 13:01:58 ET JOB#: [id number]  cc: Samantha Ragen P. Juliene PinaMody, MD, <Dictator> Rocquel Askren P Antonio Creswell MD ELECTRONICALLY SIGNED 07/15/2014 21:01

## 2015-02-12 NOTE — Consult Note (Signed)
PATIENT NAME:  Mark [name], Mark [name] MR#:  [id number] DATE OF BIRTH:  1986/07/15  DATE OF CONSULTATION:  07/18/2014  CONSULTING PHYSICIAN:  Arvel Oquinn K. Mandel Seiden, MD  SUBJECTIVE: The patient was seen in [facility]. The patient is a 29 year old, white male who is not employed, not married and lives with his mother who is 31 years old. The patient, according to the information obtained from the staff, has diabetes and he is noncompliant with medications and does not take medications as prescribed. He gets into ketoacidosis and comes here for help. The patient reports that he has no insurance and he does not get followup appointments with any physicians and he tries to buy insulin over-the-counter because that is the only way and the cheaper way to get the same.   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 He is not being followed by any psychiatrist at this time.  SOCIAL HISTORY: Alcohol and drugs denied.   MENTAL STATUS: The patient is alert and oriented to place, person and time. Calm, pleasant and cooperative. No agitation. Denies feeling depressed. Denies feeling hopeless or helpless. No psychosis. Does not appear to be responding to internal stimuli. When he was confronted about his behavior of peeing all over, he reported that "accidents happen at any time." He denies any ideas or plans to hurt himself or others. Insight and judgment are guarded. Impulse control is poor.   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 Recommend follow up with social services so that he can get medication and this can be monitored with home health services, or being in a structured facility where medications can be monitored.    ____________________________ Jannet MantisSurya K. Guss Bundehalla, MD skc:TT D: 07/18/2014 12:08:07 ET T: 07/18/2014 14:40:02  ET JOB#: [id number]  cc: Monika SalkSurya K. Guss Bundehalla, MD, <Dictator> Beau FannySURYA K Desman Polak MD ELECTRONICALLY SIGNED 08/06/2014 14:25

## 2015-02-12 NOTE — Discharge Summary (Signed)
PATIENT NAME:  Mark [name], Mark [name] MR#:  [id number] DATE OF BIRTH:  [birth year]  DATE OF ADMISSION:  05/18/2014 DATE OF DISCHARGE:  05/19/2014  PRESENTING COMPLAINT: Elevated sugars.   DISCHARGE DIAGNOSES: 1.  Hyperosmolar nonketotic hyperglycemia due to medical noncompliance with running out of insulin.  2.  Type 1 diabetes. 3.  Tobacco abuse.   DISCHARGE MEDICATIONS:  1.  Insulin 70/30 45 units once in the morning and 65 units at bedtime.  2.  Gabapentin 300 mg 1 capsule b.i.d.   DISCHARGE INSTRUCTIONS: Follow up with your primary care physician in 1 to 2 weeks.   DISCHARGE DIET: Carbohydrate-controlled diet.   HOSPITAL COURSE: Tereasa CoopMichael Goodner is a 29 year old Caucasian gentleman well known to our service from previous many admissions who comes in with:  1.  Severe hyperglycemia which was this time suspected to be hyperosmolar, hyperglycemic, nonketotic syndrome. His sugars were in the 900s. He received aggressive IV fluids and insulin drip. Sugars were at baseline. Prior to discharge he was resumed back on his insulin 70/30. The patient was counseled about importance not to interrupt his insulin treatment.  2.  Nausea and vomiting, resolved.  3.  Hyponatremia, appeared to be pseudohyponatremia.  4.  Tobacco abuse, counseled on cessation.   Hospital stay otherwise remained stable. The patient remained a FULL.  TIME SPENT: 40 minutes.   ____________________________ Wylie HailSona A. Allena KatzPatel, MD sap:sb D: 05/21/2014 07:47:50 ET T: 05/21/2014 08:29:00 ET JOB#: [id number]  cc: Gage Treiber A. Allena KatzPatel, MD, <Dictator> Willow OraSONA A Layana Konkel MD ELECTRONICALLY SIGNED 06/02/2014 11:37

## 2015-02-12 NOTE — H&P (Signed)
PATIENT NAME:  Mark [name], Mark [name] MR#:  [id number] DATE OF BIRTH:  06-Feb-1986  DATE OF ADMISSION:  05/18/2014  REFERRING PHYISICNA:  Dr. Delman Kitten.  PRIMARY CARE PHYSICIAN:  None.   CHIEF COMPLAINT:  Elevated blood sugar, nausea, vomiting.   HISTORY OF PRESENT ILLNESS:  This is a 29 year old male with known history of diabetes mellitus type 1 with multiple hospital admissions in the past secondary to hyperglycemia, DKA/hyperosmolar hyperglycemic nonketotic syndrome, the patient presents as his fingersticks were too high to be read at home, which he presented to the ED, the patient reports he ran out of his insulin yesterday, as he reports some nausea, vomiting, as well denies fever, chills, denies cough, productive sputum as well.  The patient's blood sugar on BMP was 910, but the patient had normal anion gap with CO2 level of 24, osmolality was 303, hospitalist requested to admit the patient for hyperosmolar hyperglycemic nonketotic syndrome.  The patient given multiple fluid boluses in ED and started on insulin drip.   PAST MEDICAL HISTORY:  Multiple admissions for DKA, hyperglycemia in the past.  Last admission was in June.   ALLERGIES:  AUGMENTIN, CLINDAMYCIN, PENICILLIN, SULFA.   SOCIAL HISTORY:  Continues to smoke half pack per day.  No alcohol.  No drugs.  Lives at home with his mom.   FAMILY HISTORY:  Significant for lung cancer and diabetes.   PAST SURGICAL HISTORY:  None.   HOME MEDICATIONS:  The patient currently is out of his medication, but he is on gabapentin 300 mg oral 2 times a day, insulin NPH 65 subQ units at bedtime and 45 subQ units in the morning.   REVIEW OF SYSTEMS: CONSTITUTIONAL:  Denies fever, chills.  Reports fatigue, weakness, weight gain, weight loss.  EYES:  Denies blurred vision, inflammation.  EARS, NOSE, AND THROAT:  Denies tinnitus, ear pain, hearing loss.  RESPIRATORY:  Denies cough, wheezing, shortness of breath.  CARDIOVASCULAR:  Denies chest pain,  orthopnea, syncope.  GASTROINTESTINAL:  Reports nausea, vomiting for the last 24 hours.  No coffee-ground emesis.  No melena.  No bright red blood per rectum.  GENITOURINARY:  Denies dysuria, hematuria, renal colic.  ENDOCRINE:  Denies polyuria, polydipsia, heat or cold intolerance.  HEMATOLOGY:  Denies anemia, easy bruising, bleeding diathesis.  INTEGUMENT:  Denies acne, rash or skin lesion.  MUSCULOSKELETAL:  Denies any arthritis or gout.  Reports cramps.  NEUROLOGIC:  Denies any focal deficits, tingling, numbness, dizziness, or vertigo.  PSYCHIATRIC:  Denies anxiety, insomnia, depression.   PHYSICAL EXAMINATION: VITAL SIGNS:  Temperature 98, pulse 90, respiratory rate 18, blood pressure 132/84, saturating 97% on room air.  GENERAL:  Well-nourished male, looks comfortable in bed, in no apparent distress.  HEENT:  Head atraumatic, normocephalic.  Pupils are equal and reactive to light.  Pink conjunctivae.  Anicteric sclerae.  Dry oral mucosa.  NECK:  Supple.  No thyromegaly.  No JVD.  CHEST:  Good air entry bilaterally.  No wheezing, rales, rhonchi.  CARDIOVASCULAR:  S1, S2 heard.  No rubs, murmurs, or gallops.  ABDOMEN:  Soft, nontender, nondistended.  Bowel sounds present.  EXTREMITIES:  No edema.  No clubbing.  No cyanosis.  Pedal pulses +2 bilaterally.  PSYCHIATRIC:  Appropriate affect.  Awake, alert x 3.  Intact judgment and insight.  NEUROLOGIC:  Cranial nerves grossly intact.  Motor 5 out of 5.  No focal deficits.  SKIN:  Slightly decreased skin turgor.  No rash.   PERTINENT LABORATORY DATA:  Glucose 910, BUN 14,  creatinine 1.26, sodium 128, potassium 4.7, chloride 92, CO2 24, ALT 14, AST 10, alk phos 96.  White blood cells 7, hemoglobin 13.4, hematocrit 41.4, platelets 226,000.  Urinalysis significant for a glucose urea, negative for UTI, pH 7.33.   ASSESSMENT AND PLAN: 1.  Hyperglycemia, The patient's pH is more than 7.3, has elevated osmolality level, CO2 within normal limits.   This is hyperosmolar hyperglycemic nonketotic syndrome, the patient will be started on an insulin drip, aggressive intravenous fluid hydration.  We will monitor electrolytes frequently, replace as needed, when the patient's blood sugar is controlled we will resume him back on his NPH.  The patient was counseled about the importance not to interrupt his insulin treatment.  2.  Nausea and vomiting.  We will continue with as needed Zofran.  3.  Tobacco abuse.  The patient was counseled.  4.  Hyponatremia.  This is pseudohyponatremia within normal limits once corrected to glucose level.  5.  Deep vein thrombosis prophylaxis.  SubQ heparin.  6.  CODE STATUS:  FULL CODE.   Total time spent on admission and patient care 55 minutes.     ____________________________ Albertine Patricia, MD dse:ea D: 05/19/2014 00:45:42 ET T: 05/19/2014 00:57:30 ET JOB#: [id number]  cc: Albertine Patricia, MD, <Dictator> Ersa Delaney Graciela Husbands MD ELECTRONICALLY SIGNED 05/26/2014 2:32

## 2015-02-12 NOTE — Discharge Summary (Signed)
PATIENT NAME:  Mark [name], Harout Q MR#:  [id number] DATE OF BIRTH:  Dec 13, 1985  DATE OF ADMISSION:  04/20/2014 DATE OF DISCHARGE:  04/23/2014  PRIMARY CARE PHYSICIAN: None.  DISCHARGE DIAGNOSES: 1. Diabetic ketoacidosis, resolved.  2. Diabetes mellitus type 2. 3. Diabetic neuropathy.  4. Nausea and vomiting.  5. Acute renal failure due to diabetic ketoacidosis, resolved.  DISCHARGE MEDICATIONS: Insulin NPH 70/30. Patient is on 44 units in the morning and 65 units at bedtime.  Neurontin 300 mg p.o. b.i.d.   HOSPITAL COURSE: [name] male came in because of abdominal pain, nausea and vomiting. The patient has a history of diabetes mellitus type 1; has a history of multiple episodes of DKA and was here in the hospital multiple times. He has had nausea, vomiting, abdominal pain and the patient's blood sugar was 727 at home, anion gap of 33, and the patient's pH is 7.1. He was admitted to ICU for DKA, started on IV fluids and also IV insulin drip. The patient's nausea and vomiting nicely resolved and the patient's anion gap was closed and we changed the insulin drip to his NPH 70/30 and the patient was moved to a regular floor. The patient right now feels much better. Usually at the end of the month, he runs out of his insulin and he does not have any more money to buy insulin, so that is when he comes to the hospital and he said that he cannot afford the insulin and he buys usually over the counter insulin and he said he is going to buy and adjust that insulin. Regarding neuropathy, we started him on Neurontin and he said it is working for him. I gave a prescription for Neurontin.   CONDITION: Stable.   TIME SPENT ON DISCHARGE PREPARATION: More than 30 minutes.   LABORATORY DATA: During the hospital stay: Blood sugar was 727, sodium 136, potassium 4.7, chloride 92, bicarb 11, BUN 19, creatinine 1.37. LFTs within normal limits. WBC 18, hemoglobin 14.2, hematocrit 48.3, platelets 315,000.  The  patient wbc  repeat was 13 on June 30th.     ____________________________ Katha HammingSnehalatha Blakeleigh Domek, MD sk:lm D: 04/23/2014 10:23:36 ET T: 04/23/2014 20:55:14 ET JOB#: [id number]  cc: Katha HammingSnehalatha Tashianna Broome, MD, <Dictator> Katha HammingSNEHALATHA Doria Fern MD ELECTRONICALLY SIGNED 05/06/2014 19:54

## 2015-02-12 NOTE — Discharge Summary (Signed)
PATIENT NAME:  Mark [name], Mark [name] MR#:  [id number] DATE OF BIRTH:  [birth year]  DATE OF ADMISSION:  11/26/2013 DATE OF DISCHARGE:  11/27/2013  ADMISSION DIAGNOSIS: Diabetic ketoacidosis.   DISCHARGE DIAGNOSES: 1.  Diabetic ketoacidosis secondary to noncompliance with medication and unable to obtain his medications due to financial reasons.  2.  Leukocytosis secondary to diabetic ketoacidosis. 3.  Acute renal failure secondary to diabetic ketoacidosis. 4.  Hyponatremia secondary to diabetic ketoacidosis.  HOSPITAL COURSE: This is a 29 year old male who is well known to the hospitalist service who presents frequently for medical noncompliance and running out of his medication who presented with DKA. For further details, please refer to the H and P.  1.  DKA. The patient was admitted to the hospitalist service. BMP was checked frequently. He was placed on an insulin drip. This is discontinued as his anion gap closed. He was placed on his outpatient medications, encouraged to follow up with his primary care physician, [facility] or [facility]. I did call case management to help with his disposition plan. The patient says he has no transport to these clinics and she will provide some bus information so the patient can take a bus to the clinic. 2.  Acute renal failure due to dehydration and DKA, which resolved.  3.  Hyponatremia secondary to elevated blood sugars and DKA. 4.  Leukocytosis. Stress mediated from DKA. Blood cultures negative to date.   DISCHARGE MEDICATIONS: Insulin 70/30, 65 units in the morning and 45 units in the p.m.   DISCHARGE DIET: ADA.  DISCHARGE ACTIVITY: As tolerated.  DISCHARGE FOLLOWUP: The patient will follow up with [facility] or [facility].   TIME SPENT: Approximately 40 minutes.   ____________________________ Janyth ContesSital P. Juliene PinaMody, MD spm:sb D: 11/27/2013 12:05:32 ET T: 11/27/2013 12:34:31 ET JOB#: [id number]  cc: Natale Thoma P.  Juliene PinaMody, MD, <Dictator> [facility] Preslynn Bier P Ryson Bacha MD ELECTRONICALLY SIGNED 11/27/2013 13:04

## 2015-02-12 NOTE — H&P (Signed)
PATIENT NAME:  Mark [name], Mark [name] MR#:  [id number] DATE OF BIRTH:  05-29-1986  DATE OF ADMISSION:  02/12/2014  PRIMARY DOCTOR:  None.   EMERGENCY ROOM PHYSICIAN:  Dr. Minna Antis.   CHIEF COMPLAINT:  Nausea, vomiting.   HISTORY OF PRESENT ILLNESS:  The patient is a 29 year old male with history of diabetes mellitus type I who comes to the hospital multiple times with the same kind of presentation, happened to have nausea, vomiting since this morning associated with shaking.  The patient said he lost a vial  of insulin while he was trying to get the insulin vial  from the refrigerator.  His hands were shaky and he dropped the insulin vial  two days ago.  The patient not taking insulin for the last two days and had a lot of nausea and vomiting today.  Denies any diarrhea.  No cough.  No fever.  The patient's blood sugar was high according to glucometer at home.  In the ER, the patient was found to have blood sugar of 1314 with an anion gap of 8.  The patient right now feels hungry and wants a diet soda.   PAST MEDICAL HISTORY:  Significant for a history of multiple admissions for hyperosmolar nonketotic state and also DKA.  Recent admission being on March 31st.  He was here on March 31st and discharged on April 1st.  Bipolar disorder not otherwise specified.   ALLERGIES:  AUGMENTIN, CLINDAMYCIN AND PENICILLINS AND SULFA DRUGS.   SOCIAL HISTORY:  Continues to smoke and smokes about 10 cigars a day.  No alcohol.  No drugs.  Lives with the mother.    FAMILY HISTORY:  Significant for lung cancer and diabetes.   PAST SURGICAL HISTORY:  None.   HOME MEDICATIONS:  Includes insulin 70/30.  He was discharged the last time on April 1st with insulin 70/30, 45 units at bedtime and 65 units in the morning.   REVIEW OF SYSTEMS: CONSTITUTIONAL:  He has no fever.  No fatigue.  EYES:  No blurred vision.  EARS, NOSE, THROAT:  No tinnitus.  No epistaxis.  No difficulty swallowing.  RESPIRATORY:  No cough.   No wheezing.  CARDIOVASCULAR:  No chest pain, orthopnea, no PND. GASTROINTESTINAL:  No nausea.  No vomiting.  No abdominal pain.  GENITOURINARY:  No dysuria.  ENDOCRINE:  No polyuria or nocturia.  He does have uncontrolled diabetes.  HEMATOLOGIC:  No anemia.  INTEGUMENTARY:  No skin rashes.  MUSCULOSKELETAL:  No joint pain.  NEUROLOGIC:  No numbness or weakness.  PSYCHIATRIC:  History of bipolar disorder.   PHYSICAL EXAMINATION: VITAL SIGNS:  Temperature 98.2, heart rate 90, blood pressure 132/60, sats 95% on room air.  GENERAL:  The patient is alert, awake, oriented, 29 year old male not in distress, responds to questions appropriately. HEAD:  Atraumatic, normocephalic.  EYES:  Pupils equally reacting to light.  Extraocular movements are intact.  EARS, NOSE, THROAT:  No tympanic membrane congestion.  No turbinate hypertrophy.  No oropharyngeal erythema. NECK:  Supple.  No JVD.  No carotid bruit.   RESPIRATORY:  Good respiratory effort.  Clear to auscultation.  No wheeze noted.  CARDIOVASCULAR:  S1, S2 regular.  No murmurs.  No gallops.  GASTROINTESTINAL:  Abdomen is nontender, nondistended.  Bowel sounds present.  The patient has no organomegaly.  MUSCULOSKELETAL:  Able to move extremities x 4.  SKIN:  Warm and dry.  LYMPHATIC:  No lymphadenopathy.  NEUROLOGICAL:  Cranial nerves II through XII intact.  Power 5  out of 5 upper and lower extremities.  Sensation intact.  DTRs 2+ bilaterally.  PSYCHIATRIC:  Mood and affect are within normal limits.   LABORATORY DATA:  Glucose 1314, BUN 12, creatinine 1.45, sodium is 121, potassium 5.4, chloride 87, bicarb 26, BUN 12, creatinine 1.46.  WBC 6.3, hemoglobin 13.9, hematocrit 46.4, platelets 205.  PH is 7.30, pCO2 is 46.    ASSESSMENT AND PLAN:   1.  This patient is a 29 year old male patient with history of diabetes, medical noncompliance, previous admissions significant for diabetic ketoacidosis, comes in with nausea, vomiting, uncontrolled  blood sugars, found to have hyperosmolar nonketotic state secondary to noncompliance.  The patient ran out of medication three days ago.  Start the patient on insulin drip, along with diabetic ketoacidosis protocol and aggressive hydration.  Continue to check BMP every eight hours and replace the potassium if it falls below 4.5.  2.  Acute renal failure secondary to dehydration and hyperosmolar state.  Continue fluids and check labs in the morning.  3.  Hyponatremia secondary to hyperglycemia.  Continue with fluids.  4.  Gastrointestinal prophylaxis with proton pump inhibitors.  5.  The patient has nausea, we will give Zofran for him.   TIME SPENT:  About 60 minutes.     ____________________________ Katha HammingSnehalatha Lanea Vankirk, MD sk:ea D: 02/12/2014 21:30:04 ET T: 02/12/2014 23:09:40 ET JOB#: [id number]  cc: Katha HammingSnehalatha Caleigha Zale, MD, <Dictator> Katha HammingSNEHALATHA Mayra Jolliffe MD ELECTRONICALLY SIGNED 03/16/2014 19:30

## 2015-02-12 NOTE — Discharge Summary (Signed)
PATIENT NAME:  Mark [name], Coyt Q MR#:  [id number] DATE OF BIRTH:  [birth year]  DATE OF ADMISSION:  02/12/2014 DATE OF DISCHARGE:  02/13/2014  CHIEF COMPLAINT: Nausea, vomiting.  DISCHARGE DIAGNOSES: 1. Hyperosmolar, nonketotic state due to insulin noncompliance.  2. Acute renal failure due to dehydration and hyperosmolar state, resolved.  3. Pseudohyponatremia, corrected.  4. Hyperkalemia, improved.  5. History of diabetes with noncompliance.  6. History of multiple diabetic ketoacidosis.  7. History of bipolar disorder, otherwise not specified.   DISCHARGE MEDICATIONS: Insulin NPH 70/30, 65 units in the morning and 45 units at bedtime.   DIET: Regular ADA diet.   ACTIVITY: As tolerated.   FOLLOWUP: Please follow with PCP with [facility] within 1 to 2 weeks. If you do not have an appointment, please make one.   DISPOSITION: Home.   SIGNIFICANT LABORATORY AND IMAGING: Initial glucose 1314, BUN 12, creatinine 1.46, sodium 121, potassium 5.4. Initial white count of 6.3. UA did not suggest an infection.   HISTORY OF PRESENT ILLNESS AND HOSPITAL COURSE: For full details of H and P, please see the dictation on April 24, by Dr. Luberta MutterKonidena, but briefly, this is a 29 year old, well known to our service with history of diabetes with multiple admissions for hyperosmolar, nonketotic state in DKA, who was just discharged on April 1st. He presented to the hospital with the above chief complaint after he states he accidentally broke his insulin vial a couple of days before admission and was without insulin. He was noted to have blood sugars in excess of 1300 and renal failure and hyperkalemia. He was admitted to the ICU with IV fluid resuscitation and insulin drip. The potassium did correct. He was converted from insulin drip to his outpatient regimen. His sugars have significantly improved; last three sticks are 159, 142 and 114. His acute renal failure has resolved. His pseudohyponatremia has resolved as  well with the correction of the blood sugars. He does not have any significant nausea, vomiting, tolerated diet and at this point, we will discharge.   PHYSICAL EXAMINATION: VITAL SIGNS: On the day of discharge, temperature is 98.4, pulse 86, respiratory rate 12, blood pressure 121/45, oxygen saturation 95%.  GENERAL: The patient is a well-developed male lying in bed, unkempt. HEART: Normal S1, S2. Not tachycardic.  LUNGS: Clear.  ABDOMEN: Soft, nontender.  EXTREMITIES: No pitting edema. The patient has some tattoos.   At this point, he will be discharged with outpatient followup as dictated above.   TOTAL TIME SPENT: About 40 minutes.   ____________________________ Mark EatonShayiq Idell Hissong, MD sa:dr D: 02/13/2014 16:03:58 ET T: 02/14/2014 03:24:14 ET JOB#: [id number]  cc: Mark EatonShayiq Joaopedro Eschbach, MD, <Dictator> Mark EatonSHAYIQ Stefanie Hodgens MD ELECTRONICALLY SIGNED 02/18/2014 14:34

## 2015-02-12 NOTE — H&P (Signed)
PATIENT NAME:  Mark [name], Mark [name] MR#:  [id number] DATE OF BIRTH:  10-24-1985  DATE OF ADMISSION:  06/04/2014  PRIMARY CARE PHYSICIAN: [facility]   HISTORY OF PRESENT ILLNESS: The patient is 29 year old Caucasian male with past medical history significant for history of DKA, hyperosmolar state and admission for the same on August 11th through the 13th, discharged yesterday, presents back to the hospital with complaints of nausea and vomiting which started yesterday and continued today. Apparently the patient could not afford insulin and he was not using insulin after discharge from the hospital yesterday. He started having recurrent nausea and vomiting as well as left upper quadrant abdominal pain so he presented back to the hospital, to the Emergency Room, for further evaluation where he was noted to have significant hyponatremia with sodium level of 119 and glucose level of 1105. He is complaining of lower extremity cramps as well as abdominal pain as well as nausea and vomiting and wants to be admitted. However, he tells me that he would like to drink some fluids and he prefers diet soda. He is not sure why he is having abdominal pains and he does not no of any alleviating or aggravating factors.   PAST MEDICAL HISTORY: Significant for history of multiple admissions for DKA, hyperosmolar state, last admission from August 11th to the 13th, discharged yesterday, abdominal pain with nausea and vomiting, history of renal failure in the past as well as ongoing tobacco abuse.   MEDICATIONS: The patient was discharged on NovoLog mix 70/30, 45 units at bedtime and 65 units in the morning, nicotine patch 21 mg topically daily, omeprazole 40 mg p.o. daily and Zofran 4 mg every 8 hours as needed. It is unclear which medications he is taking at all at present.   ALLERGIES: AUGMENTIN, CLINDAMYCIN, PENICILLIN, SULFA.   SOCIAL HISTORY: Smokes 1/2 pack a day, has been smoking for 15 years. No alcohol or drug  abuse. Lives at home with his mother.   FAMILY HISTORY: Significant for lung cancer as well as diabetes, according to medical records.  PAST SURGICAL HISTORY: None.  REVIEW OF SYSTEMS: The patient admits of feeling chilly, especially with nausea and vomiting, some fatigue and weakness, admits of intermittent left upper quadrant abdominal pains as well as weight loss, tells me that he lost approximately 20 pounds in the past 1 week, admits of having some blurring of vision, yellowish-brownish phlegm with cough as well as intermittent wheezing. Has been having this dark phlegm for the past few months. Admits of intermittent arrhythmias and palpitations as well as nausea, vomiting and left upper quadrant abdominal pain with no alleviating or aggravating factors. Admits of having urinary incontinence as well as urinating a lot and has to wake up at nighttime at least 4 or 5 times at night to urinate.  CONSTITUTIONAL: Denies any high fever, weight gain. EYES: Denies double vision, glaucoma or cataracts.  EARS, NOSE, THROAT: Denies tinnitus, allergies, epistaxis, sinus pain, dentures or difficulty swallowing. RESPIRATORY: Denies any hemoptysis, asthma or COPD. CARDIOVASCULAR: Denies chest pain, arrhythmia, palpitations, edema or syncope.  GASTROINTESTINAL: Denies any diarrhea, rectal bleeding, change in bowel habits.  GENITOURINARY: Denies dysuria, hematuria, frequency. ENDOCRINOLOGY: Denies polydipsia. Admits of nocturia. Denies any thyroid problems, heat or cold intolerance or thirst.  HEMATOLOGIC: Denies anemia, easy bruising or bleeding or swollen glands.  SKIN: Denies any acne, rash, lesions or change in moles.  MUSCULOSKELETAL: Denies arthritis, cramps, swelling. NEUROLOGIC: No numbness, epilepsy or tremor.  PSYCHIATRY: Denies anxiety, insomnia, or  depression.   PHYSICAL EXAMINATION: VITAL SIGNS: On arrival to the hospital, temperature was 98.6, pulse 100, respiration 18, blood pressure 127/91,  saturation 96% on room air.  GENERAL: This is well-developed, well-nourished Caucasian male in no significant distress, sitting on the stretcher.  HEENT: Pupils are equal and reactive to light. Extraocular movements intact. No conjunctivitis. Has normal hearing. No pharyngeal erythema. Mucosa is dry.  NECK: No masses. Supple and nontender. Thyroid not enlarged. No adenopathy. No JVD or carotid bruits bilaterally. Full range of motion.  LUNGS: Clear to auscultation in all fields. No rales, rhonchi or diminished breath sounds, trouble, labored inspirations, increased effort, dullness to percussion or overt respiratory distress.  CARDIOVASCULAR: S1, S2 appreciated. Rhythm was regular. PMI not lateralized. Chest is nontender to palpation. 1+ pedal pulses.  EXTREMITIES: No lower extremity edema, calf tenderness or cyanosis was noted.  ABDOMEN: Soft and nontender, minimal discomfort was noted with no rebound or guarding in the left upper quadrant. No hepatosplenomegaly or masses were noted.  RECTAL: Deferred.  MUSCLE STRENGTH: Able to move all extremities. No cyanosis, degenerative joint disease or kyphosis. Gait was not tested.  SKIN: Did not reveal any rashes, lesions, erythema, nodularity or induration. It was warm and dry to palpation.  LYMPHATIC: No adenopathy in the cervical region. The patient does have multiple tattoos. No adenopathy in the cervical region.  NEUROLOGIC: Cranial nerves grossly intact. Sensory is intact. No dysarthria or aphasia. The patient is alert and oriented to time, person and place, cooperative, poor judgment. Memory is not impaired. No significant confusion, agitation or depression was noted.  DIAGNOSTIC DATA: BMP showed a glucose level of 1,105, creatinine 1.34, sodium 119, potassium 5.9; otherwise, BMP was unremarkable. The patient's liver enzymes were remarkable for a total protein of 8.3, otherwise unremarkable. The patient's white blood cell count was normal at 7.5,  hemoglobin 13.7, platelet count 240,000, and MCV was high at 104. Urinalysis: Colorless, clear urine. More than 500 glucose. Negative for bilirubin or ketones. Specific gravity 1.018. PH was 7. Negative for blood, protein, nitrites or leukocyte esterase. Less than 1 red blood cell. No white blood cells. No bacteria was seen or epithelial cells.   EKG is not done.   Radiologic studies are not done.  ASSESSMENT AND PLAN: 1.  Diabetes mellitus with hyperosmolar state. Admit the patient to the medical floor. Start him on insulin IV drip as well as IV fluid administration. We will continue therapy. We will get social service consultation.  2.  Renal insufficiency. Continue IV fluids. Get creatinine in the morning.  3.  Hyponatremia, likely pseudohyponatremia due to hyperglycemia as well as significant dehydration. We will continue IV fluids. We will follow sodium level in the morning.  4.  Nausea and vomiting, questionable gastritis. We will initiate the patient on PPIs. We will follow symptoms with therapy. The patient may benefit from EGD if his symptoms continue. 5.  Tobacco abuse. Discussed with the patient for 5 minutes. Nicotine replacement therapy with patch will be initiated. He is agreeable for that.   TIME SPENT: 50 minutes.   ____________________________ Katharina Caper, MD rv:sb D: 06/04/2014 13:35:51 ET T: 06/04/2014 14:08:19 ET JOB#: [id number]  cc: Katharina Caper, MD, <Dictator> [facility] Chablis Losh MD ELECTRONICALLY SIGNED 06/13/2014 22:50

## 2015-02-12 NOTE — H&P (Signed)
PATIENT NAME:  Mark [name], Mark [name] MR#:  [id number] DATE OF BIRTH:  April 11, 1986  DATE OF ADMISSION:  11/26/2013  PRIMARY CARE PHYSICIAN:  Does not have one.   CHIEF COMPLAINT:  Elevated blood sugars and nausea and vomiting.   HISTORY OF PRESENT ILLNESS:  This is a 29 year old male who presents to the hospital due to uncontrolled blood sugars and having increasing nausea, vomiting over the past 3 to 4 days. The patient apparently is on insulin, but ran out of it about 3 to 4 days ago. His blood sugars have been running critically high although he has not really been checking it at home. He presented to the hospital, was noted to have significantly elevated blood sugars and noted to have an anion gap and noted to be in acute diabetic ketoacidosis. The hospitalist services were contacted for further treatment and evaluation. The patient denies any fevers, any chills, any abdominal pain, diarrhea, any other associated symptoms presently.   REVIEW OF SYSTEMS: CONSTITUTIONAL:  No documented fever. No weight gain or weight loss.  EYES:  No blurred or double vision.  ENT:  No tinnitus. No postnasal drip. No redness of the oropharynx.  RESPIRATORY:  No cough, no wheeze, hemoptysis, no dyspnea.  CARDIOVASCULAR:  No chest pain. No orthopnea. No palpitations. No syncope.  GASTROINTESTINAL:  Positive nausea. Positive vomiting. No diarrhea. No abdominal pain. No melena or hematochezia.  GENITOURINARY:  No dysuria, no hematuria.  ENDOCRINE:  No polyuria or nocturia. Heat or cold intolerance.   HEMATOLOGIC:  No anemia, no bruising, no bleeding.  INTEGUMENTARY:  No rashes. No lesions.  MUSCULOSKELETAL:  No arthritis. No swelling. No gout.  NEUROLOGIC:  No numbness or tingling. No ataxia. No seizure activity.  PSYCHIATRIC:  No anxiety, no insomnia, no ADD.   PAST MEDICAL HISTORY:  Consistent with type 1 diabetes, a history of previous DKA and medical noncompliance.   ALLERGIES:  AUGMENTIN, PENICILLIN and SULFA  DRUGS.   SOCIAL HISTORY:  Does smoke about a pack per day, has been smoking for the past 20+ years. No alcohol abuse. No illicit drug abuse. Lives at home with his mother.  FAMILY HISTORY:  The patient's mother is alive. She has a history of lung cancer and breast cancer. Father is healthy.   CURRENT MEDICATIONS:  Insulin 70/30, 65 units in the morning and 45 units in the evening.   PHYSICAL EXAMINATION: VITAL SIGNS:  Temperature 98.6, pulse 120, respirations 20, blood pressure 120/60, sats 96% on room air.  GENERAL:  A pleasant-appearing male in no apparent distress.  HEENT:  Atraumatic, normocephalic. Extraocular muscles are intact. Pupils equal and reactive to light. Sclerae anicteric. No conjunctival injection. No pharyngeal erythema.  NECK:  Supple. No jugular venous distention, no bruits, no lymphadenopathy, no thyromegaly.  HEART:  Regular rate and rhythm, tachycardic. No murmurs. No rubs. No clicks.  LUNGS:  Clear to auscultation bilaterally. No rales or rhonchi. No wheezes.  ABDOMEN:  Soft, flat, nontender, nondistended. Has good bowel sounds. No hepatosplenomegaly appreciated.  EXTREMITIES:  No evidence of any cyanosis, clubbing or peripheral edema. Has +2 pedal and radial pulses bilaterally.  NEUROLOGICAL:  Alert, awake and oriented x 3 with no focal motor or sensory deficits appreciated bilaterally.  SKIN:  Moist and warm with no rashes appreciated.  LYMPHATIC:  No cervical or axillary lymphadenopathy.   LABORATORY DATA:  Serum glucose of 79, BUN 26, creatinine 1.9, sodium 129, potassium 4.9, chloride 91, bicarb 13, anion gap is 25. LFTs are within normal limits.  White cell count 25.2, hemoglobin 15.2, hematocrit 47.4, platelet count 398. ABG showed a pH of 7.11, pCO2 of 23, pO2 of 109, sats 99%.   The patient did have a chest x-ray done which showed no evidence of any acute cardiopulmonary disease.   ASSESSMENT AND PLAN:  This is a 29 year old male with a history of diabetes,  medical noncompliance, a previous history of diabetic ketoacidosis, presents to the hospital with nausea, vomiting and uncontrolled blood sugars and noted to be in acute diabetic ketoacidosis.    1.  Diabetic ketoacidosis. This is likely secondary to medical noncompliance as the patient ran out of his meds about 3 to 4 days ago. For now, I will start the patient on an insulin drip, diabetic ketoacidosis protocol. Give him aggressive IV fluids. Follow serial MET-B's until his anion gap closes. We will replace his potassium once it falls below 4.5 and follow him clinically.  2.  Acute renal failure. This is likely in the setting of dehydration and from significant hyperglycemia. I will hydrate him with IV fluids, follow his BUN and creatinine and urine output. 3.  Hyponatremia. This is likely pseudohyponatremia secondary to the severe hyperglycemia. It should correct once his blood sugars are corrected, too.  4.  Leukocytosis. I suspect this is likely stress-mediated, no evidence of any acute infectious source. Hold off any antibiotics at this time. Blood cultures have been drawn. We will follow them up. For now, we will follow white cell count in the morning.   CODE STATUS:  The patient is a FULL CODE.   CRITICAL CARE TIME SPENT WITH THE ADMISSION: 50 minutes.   ____________________________ Belia Heman. Verdell Carmine, MD vjs:jm D: 11/26/2013 14:52:25 ET T: 11/26/2013 15:15:42 ET JOB#: [id number]  cc: Belia Heman. Verdell Carmine, MD, <Dictator> Henreitta Leber MD ELECTRONICALLY SIGNED 12/05/2013 17:54

## 2015-02-12 NOTE — H&P (Signed)
PATIENT NAME:  Mark [name], Xai Q MR#:  [id number] DATE OF BIRTH:  25-Jun-1986  DATE OF ADMISSION:  04/20/2014.  PRIMARY PHYSICIAN:  None.   EMERGENCY ROOM PHYSICIAN:  Antony BlackbirdJames Kinard, MD.  CHIEF COMPLAINT: Nausea, vomiting, abdominal pain.   HISTORY OF PRESENT ILLNESS: The patient is a 29 year old male patient with history of diabetes mellitus type I, who has come to the hospital multiple times with hyperglycemia, DKA, comes back today for the same with nausea, vomiting and abdominal pain. The patient started to have nausea, vomiting and abdominal pain since yesterday.  The patient ran out of his insulin 2 days ago. According to him his sugars were high and he had to take extra doses of insulin so he ran out of insulin 2 days ago. Blood sugar here in the ER was 727 with anion gap metabolic acidosis with anion gap of 33 and pH of 7.12.  The patient recently was here in April and was discharged on April 25.  The patient denies any cough, fever.   PAST MEDICAL HISTORY: Significant for multiple admissions for DKA previously.  His last admission was in April.   ALLERGIES:  AUGMENTIN, CLINDAMYCIN, PENICILLIN, AND SULFA.   SOCIAL HISTORY: Continues to smoke about 10 cigarettes a day. No alcohol. No drugs. Lives with his mother.   FAMILY HISTORY: Significant for lung cancer and diabetes.   PAST SURGICAL HISTORY: None.   HOME MEDICATIONS: According to his last discharge summary:  NPH 70/30, he takes 65 units in the morning and 45 units at bedtime.   REVIEW OF SYSTEMS: CONSTITUTIONAL:  The patient complains of fatigue and generalized weakness. Denies any fever.  EYES: No blurred vision.  ENT: No tinnitus. No epistaxis. No difficulty swallowing.  RESPIRATORY:  The patient has no cough. No wheezing.  CARDIOVASCULAR: No chest pain. No orthopnea.  GASTROINTESTINAL: Has nausea, vomiting and abdominal pain since yesterday.  GENITOURINARY: No dysuria.  ENDOCRINE: No polyuria or nocturia. He does have  history of DKA and frequent admissions for DKA.  INTEGUMENTARY: No skin rashes.  MUSCULOSKELETAL: No rash. NEUROLOGIC: No numbness or weakness or dysarthria.  PSYCHIATRIC: No anxiety or insomnia.   PHYSICAL EXAMINATION:   VITAL SIGNS:  Temperature 98.2, heart rate heart rate 103, blood pressure 133/81, oxygen saturation 98% on room air.  GENERAL: The patient is alert, awake, oriented.  [name] male, not in distress, answers questions appropriately.  HEAD: Normocephalic, atraumatic.  EYES: Pupils equal, reacting to light. Extraocular muscles are intact.  ENT: No tympanic membrane congestion. No turbinate hypertrophy. No oropharyngeal erythema.  NECK: Supple. No JVD. No carotid bruit.  RESPIRATORY:  Clear to auscultation. No wheeze. No rales.  CARDIOVASCULAR: S1, S2 regular, slightly tachycardic. Cardiac palpation within normal limits. Pulses are equal at carotid, femoral, and dorsalis pedis. No pedal edema.  GASTROINTESTINAL: Right abdominal tenderness in epigastric region. No rebound tenderness. No hernias. Bowel sounds present in all 4 quadrants. No rebound tenderness.  MUSCULOSKELETAL: The patient's extremities move x4. No tenderness or effusion.  SKIN: Inspection is within normal limits. There slightly decreased skin turgor.  LYPMHATIC:  No lymphadenopathy.  VASCULAR:  Good pedal pulses.  NEUROLOGIC: Alert, awake, oriented. Cranial nerves II-XII are intact.  EXTREMITIES: Power 5/5 in upper and lower extremities, motor and sensory intact.  PSYCHIATRIC: Mood and affect are within normal limits   LABORATORY DATA: Sodium 136, potassium 4.7, chloride 92, bicarbonate 11, BUN 19, creatinine 1.37, glucose 727.  LFTs within normal limits, anion gap 33. Lipase 51, WBC 18 hemoglobin 14.9,  hematocrit 48.5, platelets 315,000, venous pH 7.12, pCO2 28.   The patient's urine is clear, no leukocyte esterase.   ASSESSMENT AND PLAN:  The patient is a 29 year old male patient with type 1 diabetes  presenting with diabetic ketoacidosis.  1.  Diabetic ketoacidosis.  The patient is admitted to Critical Care Unit. Start IV fluids, normal saline at 250 mL/hour and start insulin drip at 0.1 unit per kg per hour. Check  (acucheck every one hour.  The patient's BMP will be checked 6 hours. The patient's IV fluids will be changed D5/half at 200 per hour if his sugars are less than 200. The patient will be converted back to his insulin 70/30 when anion gap closes.  2.  Acute renal failure secondary to diabetic ketoacidosis.  Continue IV fluids.  3.  Nausea, vomiting, abdominal pain, likely secondary to be diabetic ketoacidosis. Continue fluids and continue Zofran and PPIs.  4.  Deep vein thrombosis prophylaxis with early ambulation and subcutaneous heparin.  5.  Leukocytosis. No evidence of infection at this time so watch closely and follow the trend.   TIME SPENT ON HISTORY AND PHYSICAL:  About 60 minutes.    ____________________________ Katha Hamming, MD sk:lt D: 04/20/2014 10:29:10 ET T: 04/20/2014 11:28:19 ET JOB#: [id number]  cc: Katha Hamming, MD, <Dictator> Katha Hamming MD ELECTRONICALLY SIGNED 05/06/2014 19:53

## 2015-02-12 NOTE — Discharge Summary (Signed)
PATIENT NAME:  Mark [name], Mark [name] MR#:  [id number] DATE OF BIRTH:  Nov 04, 1985  DATE OF ADMISSION:  06/04/2014 DATE OF DISCHARGE:  06/06/2014  PRIMARY CARE PHYSICIAN: [facility].  CHIEF COMPLAINT AT THE TIME OF ADMISSION: High blood sugar.   ADMITTING DIAGNOSES: Diabetes mellitus with hyperosmolar state, acute renal insufficiency, hyponatremia.   DISCHARGE DIAGNOSES:  1. Diabetes mellitus with hyperosmolar is resolved.  2. Insulin-dependent diabetes mellitus. The patient is very noncompliant and not cooperate.  3. Acute renal insufficiency resolved with IV fluids.  4. Pseudohyponatremia from hyperglycemia, resolved.  5. Nausea, vomiting resolved.  6. Tobacco abuse. Counseled patient for 5 minutes and advised to take over-the-counter nicotine patch.   CONSULTATIONS: None.   PROCEDURES: None.   BRIEF HISTORY AND HOSPITAL COURSE: The patient is a 29 year old Caucasian male with a past medical history of DKA and multiple admissions in the past few weeks with similar complaint of hyperglycemia, was just recently admitted from August 11th through August 13th. The patient was discharged and came back to the hospital with nausea and vomiting. Please review history and physical for details. The patient states that he could not afford insulin. The patient was admitted to the intensive care unit. He was placed on IV fluid hydration and insulin drip.   During the hospital course, the patient's condition clinically significantly improved by next day. Insulin drip was weaned off and patient's IV fluids were changed to D5 half normal with 20 of K. The patient's home dose of insulin was resumed. He was transferred to stepdown unit. Subsequently, his renal insufficiency, nausea, vomiting were resolved. Case management consult was placed regarding medical assistance. In the interim, the patient was found to be the uncooperative to the hospital staff. He was refusing DVT prophylaxis and Protonix. He left the  building for some time, and came back. Before leaving the hospital building he pulled his IV line also. The patient is reporting that he does not have insurance, he does not have income. He cannot afford any medications. He is also stating that he does not have transportation to go to doctors' offices. However, he is very demanding and asking for pain medication and threatening the hospital staff regarding the pain medications. Case management was consulted and Novolin 70/30, his home medication, is arranged through our hospital pharmacy. The patient's vital signs are stable and he is getting discharged under stable conditions.   DISCHARGE MEDICATIONS: Novolin 70/30 at 45 units subcutaneous once daily in a.m. and 55 units subcutaneous daily at bedtime. Insulin syringes were also arranged by the case Production designer, theatre/television/filmmanager and Child psychotherapistsocial worker. Nicotine patch 20 mg topically once daily.   ACTIVITY: As tolerated.   DIET: ADA diet.   FOLLOWUP: Follow up with [facility] in 1 week. The patient is to call and make an appointment. The patient was also provided information with the outpatient diabetic clinic for followup. The patient was counseled to quit smoking.   LABORATORY AND IMAGING STUDIES: On August 15th: BUN 8, creatinine 0.84, sodium 135, potassium 3.8, chloride 101, CO2 of 29. GFR greater than 60. Anion gap 5. Serum osmolality and calcium are normal. Troponin less than 0.02. CBC is normal.   Plan of care was discussed in detail with the patient. RN Trey PaulaJeff was present during the discussion.  TOTAL TIME SPENT ON DISCHARGE: 45 minutes.    ____________________________ Ramonita LabAruna Tiquan Bouch, MD ag:lt D: 06/06/2014 14:53:24 ET T: 06/06/2014 15:31:03 ET JOB#: [id number]  cc: Ramonita LabAruna Vania Rosero, MD, <Dictator> Ramonita LabARUNA Jaythen Hamme MD ELECTRONICALLY SIGNED 06/10/2014  16:10 

## 2015-02-12 NOTE — Discharge Summary (Signed)
PATIENT NAME:  Mark [name], [name] MR#:  [id number] DATE OF BIRTH:  03/26/1986  DATE OF ADMISSION:  01/19/2014 DATE OF DISCHARGE:  01/20/2014  ADMITTING DIAGNOSIS: Nausea and vomiting.   DISCHARGE DIAGNOSES: 1.  Nausea and vomiting due to diabetic ketoacidosis, now resolved.  2.  Medication noncompliance.  3.  Hyponatremia due to hyperglycemia.  4.  History of bipolar disorder.  5.  Hyperkalemia, now resolved.   LABORATORIES: Admitting WBC 7.9, hemoglobin 13, platelet count 224,000. Urinalysis was negative. LFTs were normal. BMP: Glucose 924, BUN 20, creatinine 1.22, sodium 126, potassium 5.5, chloride 93, CO2 17. Anion gap 16. Calcium 8.2.   HOSPITAL COURSE: Please refer to H and P done by the admitting physician. The patient is a 29 year old white male who is very familiar to us with his multiple admissions. The patient refuses to see a physician. We have made multiple attempts to get him an appointment at [facility], but he has never shown up there. Apparently, he states that he is getting over the counter insulin from [employer] that is prescribed for the animals, who presented with complaint of nausea and vomiting for 2 days duration. As previously, he is noted to be in DKA again. He was placed on aggressive IV fluids, insulin drip, after that treatment overnight his anion gap resolved. His nausea and vomiting is resolved and at this point he is stable for discharge.   DISCHARGE MEDICATIONS: Insulin 70/30 45 units at bedtime and 65 units in the morning.   DIET: Carbohydrate-controlled.  ACTIVITY: As tolerated.   DISCHARGE FOLLOWUP: The patient is strongly recommended to follow with a primary MD.  TIME SPENT ON DISCHARGE: 32 minutes.  ____________________________ Lacie ScottsShreyang H. Allena KatzPatel, MD shp:sb D: 01/21/2014 08:41:35 ET T: 01/21/2014 08:59:23 ET JOB#: [id number]  cc: Aquan Kope H. Allena KatzPatel, MD, <Dictator> Charise CarwinSHREYANG H Alise Calais MD ELECTRONICALLY SIGNED 01/29/2014 8:49

## 2015-02-12 NOTE — H&P (Signed)
PATIENT NAME:  Mark [name], Mark [name] MR#:  [id number] DATE OF BIRTH:  1986/03/28  DATE OF ADMISSION:  10/08/2013  PRIMARY CARE PHYSICIAN: None.   HISTORY OF PRESENT ILLNESS:  The patient is a 29 year old Caucasian male with past medical history significant for a history of diabetes mellitus type 1 who presents to the hospital with complaints of nausea, vomiting. According to the patient, he was doing well up until approximately two days ago when he started having nausea and vomiting. He is able to drink some, but he is not able to hold it down. He is having also upper abdominal pain for the past one day. He is describing this pain as intermittent, mostly coming before he needs to vomit and somewhat gets better after he vomits.  He denies any fevers or chills; however, admits to having significant sweats.  Denies any diarrheal stools. He in fact, feels constipated. He did not have any bowel movements over the past two days. He denies any vomiting with blood or bloody stools or black or tarry-looking stools. He admits of feeling fatigued. He feels that he lost some weight. He also admits of having some blurring of vision and palpitations. On arrival to the Emergency Room, he was tachycardia with heart rate of 150 and his lab studies revealed that he is acidotic. He was in DKA, and hospitalist services were contacted for admission. Now after a few liters of normal saline infusion, his heart rate is in the 120s and hospitalist services were contacted for admission.   PAST MEDICAL HISTORY: Significant for history of diabetes mellitus type 1, hypertension, hyperlipidemia, bipolar disorder.   SOCIAL HISTORY: He smokes daily. He denies alcohol, tobacco abuse. Lives with his mother.   FAMILY HISTORY: Positive for diabetes.   ALLERGIES:  AUGMENTIN, PENICILLIN, AS WELL AS SULFA DRUGS.   HOME MEDICATIONS:  Insulin 70/30 65 units in the morning and 45 units at night. His hemoglobin [facility]; however, the patient does not know exactly know how high or low it was. Hemoglobin A1c was checked our hospital in August 2014 and at that time it was 11.2   REVIEW OF SYSTEMS:  CONSTITUTIONAL:  Positive for feeling fatigued and weak. No fevers. He  admits of having some weight loss, some blurring of vision. Admits to having some cough with yellowish phlegm production. Denies any significant difference of  his phlegm over the past few weeks. Admits of having palpitations, denies any chest pains. Admits of having abdominal pains, nausea, vomiting, but no diarrhea, some sweats, especially with vomiting. Denies any hematemesis or hematochezia. Denies fevers or chills.   EYES: No double vision, glaucoma or cataracts.  EARS, NOSE, THROAT:  No tinnitus, allergies, difficulty swallowing.  LUNGS:  Denies any wheezes, asthma, chronic obstructive pulmonary disease.  CARDIOVASCULAR: Denies chest pains, orthopnea, edema, arrhythmias or presyncopal feeling.  GASTROINTESTINAL:  Admit to abdominal pain. Denies any hematemesis. He denies any (Dictation Anomaly)  pain.  GENITOURINARY: Denies any dysuria, hematuria, frequency, incontinence, admits of having increased frequency of urination prior to today.  Today he has somewhat decreased frequency of urination.  MUSCULOSKELETAL: Denies arthritis, cramps, or swelling.  NEUROLOGIC:   No numbness, epilepsy, tremors.  PSYCHIATRIC: Denies anxiety, insomnia or depression.   PHYSICAL EXAMINATION: VITAL SIGNS: On arrival to the hospital, the patient's temperature was 97.6. Pulse was 130, respiration 72, blood pressure 118/78, saturation was 98% on room air.  GENERAL: This is a well-developed, well-nourished, pale  and uncomfortable with dry mucosa young  gentleman sitting on  the stretcher.  HEENT: His pupils equal, reactive to light. Extraocular movements are intact.  No icterus or conjunctivitis.  He has  normal hearing. No pharyngeal erythema.  Mucosa is very dry.  NECK: No masses, nontender. Thyroid is not enlarged. No adenopathy. No JVD or carotid bruits bilaterally. Full range of motion.  LUNGS: Diminished breath sounds as well as rhonchi were heard. A few wheezes were heard at bases, especially on the left side and chest nontender to palpation.  EXTREMITIES: 1+ pedal pulses, no lower extremity edema, clubbing or cyanosis was noted.  CARDIOVASCULAR: S1, S2 appreciated. No rubs, murmurs.  PMI not displaced. Chest is nontender to palpation.  ABDOMEN: Soft and minimally tender in the epigastrium area, but no rebound or guarding were noted. No hepatosplenomegaly or masses were noted.  RECTAL: Deferred.  MUSCULOSKELATAL: The patient was able to move all extremities. No cyanosis, degenerative joint disease or kyphosis. Gait is not tested.   SKIN: Did not reveal any rashes, lesions, erythema, nodularity or induration. It was warm and dry to palpation.   LYMPHATIC: No adenopathy in the cervical region.  NEUROLOGICAL: Cranial nerves grossly intact. Sensory is intact. No dysarthria or aphasia.  PSYCHIATRIC:  The is alert, oriented to time, person and place, cooperative. Memory is good. No significant confusion, agitation or depression noted.   LABORATORY AND RADIOLOGICAL DATA: BMP reveals a glucose of 435. BUN and creatinine were within normal limits, sodium 132, potassium 5.4, bicarbonate level of 112, anion gap 20. Beta hydroxybutyrate is more than 46. Magnesium level is normal to 2.2. The patient's total protein was 8.9, alkaline phosphatase 154 enzymes were normal. White blood cell count is up to 21.7, hemoglobin was 15.5, platelet count was 105.   Urinalysis: Straw colored urine, more than 500 glucose, negative for bilirubin, 2+ ketones, specific gravity 1.018 and pH was 5.0, 3+ blood, 30 mg/dL protein, negative for nitrites, leukocyte esterase.  148. Red blood cells,  2 white blood cells , no bacteria and no epithelial cells, pH was 7.16 on  venous test and, pCO2 was 35.   EKG showed sinus tachy at 113 beats per minute, normal axis, possible left atrial enlargement according to EKG criteria. No acute ST-T changes were noted.   Radiologic Studies: None done.   ASSESSMENT AND PLAN: 1.  Diabetic ketoacidosis. Admit the patient to Critical Care Unit start him on IV drip, as well as IV fluids at a high rate. Get hemoglobin A1c, we will check his blood glucose levels frequently get, diabetic teaching as well as dietary consultation.  2.  Nausea, vomiting. Start clear liquids and the patient's bicarbonate level is between 18 to 20. We will continue proton pump inhibitor IV for now as well as ice chips if needed.   3.  Leukocytosis likely stress related, however, we could not rule out bronchitis symptoms. We will follow with antibiotic therapy.  4.  Acute bronchitis versus, smoker's cough. We will get sputum cultures and will start the patient on Levaquin.  5.  Abdominal pain. We will get lipase level.  6.  Tobacco abuse. Nicotine replacement therapy will be initiated. Discussed with the patient quitting issues for  approximately 3 to 4 minutes.   TIME SPENT: 50 minutes   ____________________________ Katharina Caper, MD rv:cc D: 10/08/2013 17:54:24 ET T: 10/08/2013 19:23:34 ET JOB#: [id number]  cc: Katharina Caper, MD, <Dictator> Kendyll Huettner MD ELECTRONICALLY SIGNED 10/30/2013 16:31

## 2015-02-12 NOTE — H&P (Signed)
PATIENT NAME:  Mark [name], Ettore Q MR#:  [id number] DATE OF BIRTH:  17-Dec-1985  DATE OF ADMISSION:  07/18/2014  REFERRING PHYSICIAN: Rebecka ApleyAllison P. Webster, MD  PRIMARY CARE PHYSICIAN: [facility].   CHIEF COMPLAINT: Nausea, vomiting, abdominal pain.   HISTORY OF PRESENT ILLNESS: This is a 29 year old Caucasian gentleman with a past medical history of type 1 diabetes, originally diagnosed around age 29, as well as a history of medical noncompliance, presenting with nausea, vomiting, abdominal pain. He was recently admitted in the hospital. Please refer to my history and physical performed on 07/15/2014. He at that time was admitted with DK, placed on an insulin drip, his gap closed, placed on long-acting insulin and subsequently discharged. He states he was discharged "without shoes or insulin," and thus he has not taken any insulin and comes back with similar symptoms, once again complaining of nausea, vomiting, abdominal pain. Describes abdominal pain, periumbilical, nonradiating, 4/10 in intensity, with associated nausea and vomiting. Denies fevers, chills, further symptomatology.  REVIEW OF SYSTEMS:  CONSTITUTIONAL: Denies fevers, chills, fatigue, or weakness.  EYES: Denies blurry vision, double vision, or eye pain. EARS: Denies tinnitus, ear pain, hearing loss.  RESPIRATORY: Denies cough, wheeze, shortness of breath.  CARDIOVASCULAR: Denies chest pain, palpitations, edema.  GASTROINTESTINAL: Positive for nausea, vomiting, abdominal pain as described above.  GENITOURINARY: Denies dysuria, hematuria.  ENDOCRINE: Denies nocturia or thyroid problems. HEMATOLOGIC AND LYMPHATIC: Denies easy bruising, bleeding.  SKIN: Denies rash or lesion.  MUSCULOSKELETAL: Denies pain in neck, back, shoulder, knees, hips, or arthritic symptoms.  NEUROLOGIC: Denies paralysis, paresthesias.  PSYCHIATRIC: Denies anxiety or depressive symptoms.  Otherwise, full review of systems performed by me is negative.    PAST MEDICAL HISTORY: Type 1 diabetes, originally diagnosed around age 29, multiple hospitalization for DKA secondary to medical noncompliance, most recent hospitalization 2 days ago, as well as history of tobacco abuse.   SOCIAL HISTORY: Continued daily tobacco usage. Denies any alcohol or drug use.   FAMILY HISTORY: Positive for diabetes.   ALLERGIES: AUGMENTIN, CLINDAMYCIN, PENICILLIN, AS WELL AS SULFA DRUGS.   HOME MEDICATIONS: Insulin 70/30, 45 units in the morning, 65 units in the evening.  PHYSICAL EXAMINATION:  VITAL SIGNS: Temperature 98.2, heart rate 120, respirations 22, blood pressure 163/94, saturating 90% on room air. Weight 95.3 kg, BMI 27.  GENERAL: Somewhat disheveled Caucasian gentleman, currently in no acute distress.  HEAD: Normocephalic, atraumatic.  EYES: Pupils equal, round, reactive to light. Extraocular muscles intact. No scleral icterus.  MOUTH: Dry mucosal membrane. Dentition poor but intact. No abscess noted. EARS, NOSE, THROAT: Clear without exudates. No external lesions.  NECK: Supple. No thyromegaly or nodules. No JVD.  PULMONARY: Clear to auscultation bilaterally without wheezes, rales, or rhonchi. No use of accessory muscles. Good respiratory effort.  CHEST: Nontender to palpation. CARDIOVASCULAR: S1, S2, regular rate and rhythm. No murmurs, rubs, or gallops. No edema. Pedal pulses 2+ bilaterally. GASTROINTESTINAL: Soft, nontender, nondistended. No masses. Positive bowel sounds. No hepatosplenomegaly. MUSCULOSKELETAL: No swelling, clubbing, or edema. Range of motion full in all extremities.  NEUROLOGIC: Cranial nerves II through XII intact. No gross focal neurological deficits. Sensation intact. Reflexes intact. SKIN: No ulcerations, lesions, or rashes noted. Skin warm and dry. Turgor intact. PSYCHIATRIC: Mood and affect within normal limits. Awake, alert, oriented x 3. Insight and judgment intact.   LABORATORY DATA: Sodium 129, potassium 5.4, chloride  89, bicarbonate 10, anion gap of 30, BUN 26, creatinine 1.36, glucose 795. LFTs: Total protein 8.6, alkaline phosphatase 151, otherwise within normal  limits. WBC 18, hemoglobin 15.2, platelets of 387,000. Urinalysis negative for evidence of infection. VBG performed, pH of 7.1.   ASSESSMENT AND PLAN: [name] Caucasian gentleman with a history of type 1 diabetes, originally diagnosed at age 48, with a multitude of admissions for diabetic ketoacidosis secondary to medical noncompliance, most recent performed by myself 2 days ago. He was apparently discharged from [facility] with "no shoes or insulin." Since that time he has taken no insulin, thus re-presenting in diabetic ketoacidosis. 1.  Diabetic ketoacidosis. Will admit to the CCU, provide intravenous fluid hydration with normal saline 250 mL an hour. Initiate insulin drip at 0.1 units/kg per hour. Fingersticks q. 1 hour. BMP q. 4 hours. When glucose less than 200, will change intravenous fluids to D5 in half normal saline at 200 mL an hour and decrease insulin drip to 0.05 units/kg per hour. When the gap closes, administer subcutaneous insulin 0.5 to 0.8 units/kg per day or his home basal insulin which is 70/30, 45 units in the a.m., 65 units in the p.m.; whatever is greater, and then discontinue insulin drip 1 hour after that. In addition, we will also add 20 mEq of potassium to his intravenous fluids whenever his potassium level falls between 3.3 and 5.3. Currently it does not. 2.  Venous thromboembolism prophylaxis with heparin subcutaneous.  CODE STATUS: The patient is full code.  CRITICAL CARE TIME SPENT: 45 minutes.    ____________________________ Cletis Athens. Jaimey Franchini, MD dkh:ST D: 07/18/2014 00:15:33 ET T: 07/18/2014 01:31:49 ET JOB#: [id number]  cc: Cletis Athens. Even Budlong, MD, <Dictator> Amaziah Ghosh Synetta Shadow MD ELECTRONICALLY SIGNED 07/18/2014 23:39

## 2015-02-12 NOTE — H&P (Signed)
PATIENT NAME:  Mark [name], REAP MR#:  [id number] DATE OF BIRTH:  [birth year]  DATE OF ADMISSION:  06/01/2014  REFERRING PHYSICIAN: [name] R. Fanny Bien, MD  PRIMARY CARE PHYSICIAN: None.   CHIEF COMPLAINT: Elevated blood sugar, nausea, vomiting.   HISTORY OF PRESENT ILLNESS: This is a 29 year old male who is known to [place] with multiple admissions for hyperglycemia. The patient was recently discharged on 05/19/2014 for hyperglycemia episodes. The patient had multiple admissions in the past for DKA/hyperosmolar hyperglycemic nonketotic syndrome. The patient presents as his fingersticks were too high at home to read; reports he has been out of his insulin for the last 48 hours due to financial reason, as he had to pay an electricity bill where he could not afford to buy his insulin any more. The patient reports some nausea and vomiting. Denies fever, chills, fatigue, dysuria, cough productive of sputum. Upon presentation, the patient blood glucose was 1039, with acute renal failure with creatinine of 1.68 with pseudo hyponatremia, with sodium of 123. The patient was given Lispro x 2, 10 mg each, subcutaneous, as well, in the Emergency Room, and was given fluid boluses. The patient's anion gap was 16, and his CO2 was 21.  Hospitalist requested to admit the patient for hyperosmolar hyperglycemic nonketotic syndrome.   PAST MEDICAL HISTORY:  1.  Multiple admissions for DKA, hyperglycemia, and hyperosmolar hyperglycemic nonketotic syndrome in the past.  2.  Tobacco abuse. 3.  Type 1 diabetes mellitus.   ALLERGIES: AUGMENTIN, CLINDAMYCIN, PENICILLIN, SULFA.   SOCIAL HISTORY: Continues to smoke half pack per day. No alcohol. No drugs. Lives at home with his mother.   FAMILY HISTORY: Significant for lung cancer and diabetes.   PAST SURGICAL HISTORY: None.   HOME MEDICATIONS: The patient currently is not taking any home medications. He ran out of his medications but he is supposed to be on insulin 70/30, 45  units once in the morning and 65 at bedtime; gabapentin 300 mg oral b.i.d.  REVIEW OF SYSTEMS: CONSTITUTIONAL: Denies fever, chills. Reports fatigue, weakness. Denies weight gain, weight loss.  EYES: Denies blurry vision, double vision, inflammation, glaucoma.  ENT: Denies tinnitus, ear pain, hearing loss, epistaxis.  RESPIRATORY: Denies cough, wheezing, hemoptysis, shortness of breath.  CARDIOVASCULAR: Denies chest pain, orthopnea, syncope.  GASTROINTESTINAL: Reports nausea, vomiting. Denies coffee-ground emesis, diarrhea, bright red blood per rectum, melena.  GENITOURINARY: Denies dysuria. Reports polyuria. Denies renal colic.  ENDOCRINE: Reports history of diabetes. Denies heat or cold intolerance. Denies polydipsia. Reports polyuria. HEMATOLOGIC: Denies anemia, easy bruising, bleeding, diathesis.  INTEGUMENT: Denies acne, rash, or skin lesion.  MUSCULOSKELETAL: Denies any arthritis, gout, or cramps.  NEUROLOGIC: Denies any history of CVA, TIA, any focal deficits, tingling, numbness. 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  PSYCHIATRIC: Denies anxiety, insomnia, or depression.   PHYSICAL EXAMINATION:  VITAL SIGNS: Temperature 97.3, pulse 90, respiratory rate 18, blood pressure 122/78, saturating 98% on room air.  GENERAL: Well-nourished male who looks comfortable in bed, in no apparent distress.  HEENT: Head atraumatic, normocephalic. Pupils equal, reactive to light. Pink conjunctivae. Anicteric sclerae. Moist oral mucosa.  NECK: Supple. No thyromegaly. No JVD. No carotid bruits. No lymphadenopathy.  CHEST: Good air entry bilaterally. No wheezing, rales, rhonchi. No use of accessory muscles.  CARDIOVASCULAR: S1, S2 heard. No rubs, murmur, or gallops.  ABDOMEN: Soft, nontender, nondistended. Bowel sounds present. No hepatosplenomegaly.  EXTREMITIES: No edema. No clubbing. No cyanosis. Good capillary refill. Pedal pulses +2 bilaterally.  PSYCHIATRIC: Appropriate affect. Awake, alert x 3. Intact  judgment and  insight and insight. Neurology: Cranial nerve  grossly intact. Motor 5/5. No focal deficits. Sensation symmetrical and intact to light touch.  MUSCULOSKELETAL: No joint effusion or erythema.  SKIN: Delayed skin turgor, dry.   PERTINENT LABORATORY DATA: Glucose 1039, BUN 18, creatinine 1.68, sodium 123, potassium 4.9, chloride 86, CO2 of 21. White blood cells 9.1, hemoglobin 15, hematocrit 47.9, platelet 274,000.   ASSESSMENT AND PLAN:  1.  Hyperglycemia. The patient presents with osmolality of 302, but no anion gap and CO2 within normal limits. Much of this is due to hyperosmolar hyperglycemic nonketotic syndrome. The patient will be started on insulin drip. Continue with aggressive intravenous fluid hydration. We will monitor his electrolytes frequently. We will check BNP in the a.m. and one in the afternoon and we will replete electrolytes as needed. Once the blood sugar is controlled, the patient will be changed back to his home NPH. Had a lengthy discussion with the patient, about medication compliance and importance to continue insulin and explained side effects of noncompliance.  2.  Nausea and vomiting. This is due to his hyperglycemia. We will continue with as-needed Zofran.  3.  Tobacco abuse. The patient was counseled. We will start him on nicotine patch. 4.  Acute renal failure. This is due to volume depletion and dehydration. We will start the patient on aggressive intravenous fluid hydration.  5.  Deep vein thrombosis prophylaxis. Subcutaneous heparin.  CODE STATUS: Full code.  TOTAL TIME SPENT ON ADMISSION AND PATIENT CARE: 55 minutes.   ____________________________ Starleen Armsawood S. Avey Mcmanamon, MD dse:sk D: 06/01/2014 01:10:21 ET T: 06/01/2014 02:10:08 ET JOB#: [id number]  cc: Starleen Armsawood S. Aloise Copus, MD, <Dictator> Emonii Wienke Teena IraniS Jillayne Witte MD ELECTRONICALLY SIGNED 06/01/2014 3:29

## 2015-02-12 NOTE — Discharge Summary (Signed)
PATIENT NAME:  Mark [name], Mark [name] MR#:  [id number] DATE OF BIRTH:  1986/06/21  DATE OF ADMISSION:  07/18/2014 DATE OF DISCHARGE:  07/20/2014  DISCHARGE DIAGNOSES:  1. Diabetic ketoacidosis, resolved. 2. Medication noncompliance.   SECONDARY DIAGNOSES: 1. Diabetes mellitus.  2. Substance abuse.  3. Tobacco abuse.   CONSULTATIONS: Psychiatry, Dr. Mordecai RasmussenJohn Clapacs.   PROCEDURES/RADIOLOGY: None.   HISTORY AND SHORT HOSPITAL COURSE: The patient is a 29 year old diabetic, who has had multiple admissions for diabetic ketoacidosis due to medication noncompliance and was admitted one more time for the same issue with diabetic ketoacidosis on  27th of September, was started on insulin drip for diabetic ketoacidosis protocol. Please see Dr. Deatra Inaavid Hower's dictated history and physical for further details. Once his ketoacidosis was resolved, he was transitioned to the floor care. He was counseled very strongly to make sure he continues taking his medication. Last time he was discharged he did have all the insulin free available at his clinic made sure by care management, although he never went to pick it up. This time this was delivered at his bedside with 4 vials of insulin, and he was agreeable to take it for now. He also agreed to pick up his prescription as he has his check of Social Security coming up on the 3rd of October when he usually takes care of his monthly medications and pays his bills.  He was feeling much better and was discharged home on the 29th of September in stable condition.   PHYSICAL EXAMINATION: On the date of discharge, his vital signs are as follows:  VITAL SIGNS: Temperature 97.5, heart rate 73 per minute, respirations 18 per minute, blood pressure 123-76.  He was saturating 98% on room air. Pertinent physical examination on the date of discharge:  CARDIOVASCULAR: S1, S2 normal. No murmurs, rubs, or gallops.  LUNGS: Clear to auscultation bilaterally. No wheezing, rales, rhonchi, or  crepitation.  ABDOMEN: Soft, benign.  NEUROLOGIC: Nonfocal examination. All other physical examination remained at baseline.   DISCHARGE MEDICATIONS: Insulin 70/30, 65 subcutaneously at bedtime and 45 units subcutaneously every morning.   DISCHARGE DIET: 1800 ADA.   DISCHARGE ACTIVITY: As tolerated.   DISCHARGE INSTRUCTIONS AND FOLLOWUP: The patient was instructed to follow up at [facility] in 1 to 2 weeks. He was also instructed to follow up at the diabetes clinic at [facility] and try to avoid being readmitted to the hospital and make sure he takes all of his medications and follows with his doctor.   TOTAL TIME DISCHARGING THIS PATIENT: 45 minutes.   Please note, the patient remains at very high risk for readmission due to his psychosocial issues and poor medication compliance or following with anyone as an outpatient.    ____________________________ Ellamae SiaVipul S. Sherryll BurgerShah, MD vss:hh D: 07/22/2014 23:18:20 ET T: 07/22/2014 23:48:45 ET JOB#: [id number]  cc: Tallie Hevia S. Sherryll BurgerShah, MD, <Dictator> Audery AmelJohn T. Clapacs, MD [facility]  Ellamae SiaVIPUL S [facility] MD ELECTRONICALLY SIGNED 07/27/2014 15:48

## 2015-02-12 NOTE — H&P (Signed)
PATIENT NAME:  Mark [name], Mark [name] MR#:  [id number] DATE OF BIRTH:  11/11/1985  DATE OF ADMISSION:  01/19/2014  REFERRING PHYSICIAN: Dr. Lenard Lance.   PRIMARY CARE PHYSICIAN: None.   CHIEF COMPLAINT: Nausea and vomiting.    HISTORY OF PRESENT ILLNESS: [name] Caucasian gentleman with past medical history of type 1 diabetes, presenting with nausea and vomiting. He describes not having any insulin for 2 days' duration. Now having nausea and vomiting of 1 day duration with associated abdominal pain described as cramping abdominal pain, periumbilical, nonradiating, intensity 4 to 5 out of 10. No worsening or relieving factors. Had a few episodes of emesis, nonbloody, nonbilious. On arrival to the Emergency Department, noted to have glucose of 924. Currently no further complaints.   REVIEW OF SYSTEMS:   CONSTITUTIONAL: Denies fever, fatigue, weakness.  EYES: Denies blurred vision, double vision, eye pain.  EARS, NOSE, THROAT: Denies tinnitus, ear pain, hearing loss.  RESPIRATORY: Denies cough, wheeze, shortness of breath.  CARDIOVASCULAR: Denies chest pain, palpitations, edema.  GASTROINTESTINAL: Positive for nausea and vomiting. Denies any diarrhea. Positive for abdominal pain.  GENITOURINARY: Denies dysuria or hematuria.  ENDOCRINE: Denies nocturia or thyroid problems.  HEMATOLOGIC AND LYMPHATIC: Denies easy bruising or bleeding.  SKIN: Denies rash or lesion.  MUSCULOSKELETAL: Denies pain in the neck, back, shoulders, knees, hips or arthritic symptoms.  NEUROLOGIC: Denies paralysis, paresthesias.  PSYCHIATRIC: Denies anxiety or depressive symptoms.   Otherwise, full review of systems performed by me is negative.   PAST MEDICAL HISTORY: Type 1 diabetes originally diagnosed at age 13, has had previous episodes of diabetic ketoacidosis requiring hospitalization, also states has history of bipolar disorder not otherwise specified.   SOCIAL HISTORY: Positive for tobacco abuse. Denies any  alcohol or drug use.   FAMILY HISTORY: Positive for lung cancer as well as diabetes.   ALLERGIES: AUGMENTIN, CLINDAMYCIN, PENICILLIN AND SULFA DRUGS.   HOME MEDICATIONS: Include insulin 70/30 45 units at bedtime and 65 units in the morning.   PHYSICAL EXAMINATION:  VITAL SIGNS: Temperature 98.3, heart rate 101, respirations 14, blood pressure 140/67, saturating 96% on room air. Weight 102.1 kg, BMI 28.9.  GENERAL: Well-nourished, well-developed, Caucasian gentleman who appears somewhat disheveled, though in no acute distress.  HEAD: Normocephalic, atraumatic.  EYES: Pupils equal, round and reactive to light. Extraocular muscles intact. No scleral icterus.  MOUTH: Dry mucosal membranes. Dentition intact. No abscess noted.  EARS, NOSE, THROAT: Throat is clear without exudates. No external lesions.  NECK: Supple. No thyromegaly. No nodules. No JVD.  PULMONARY: Clear to auscultation bilaterally without wheeze, rubs or rhonchi. No use of accessory muscles. Good respiratory effort.   CHEST: Nontender to palpation.  CARDIOVASCULAR: S1, S2, regular rate and rhythm. No murmurs, rubs or gallops. No edema. Pedal pulses 2+ bilaterally.  GASTROINTESTINAL: Soft, nontender, nondistended. No masses. Positive bowel sounds. No hepatosplenomegaly.  MUSCULOSKELETAL: No swelling, clubbing, edema. Range of motion full in all extremities.  NEUROLOGIC: Cranial nerves II through XII intact. No gross focal neurological deficits. Sensation intact. Reflexes intact.  SKIN: No ulcerations, lesions, rash or cyanosis. Skin warm, dry. Turgor intact.  PSYCHIATRIC: Mood and affect within normal limits. The patient is awake, alert and oriented x 3. Insight and judgment intact.   LABORATORY DATA: Sodium 126, potassium5.5, chloride 96, bicarb 17, anion gap of 16, BUN 20, creatinine 1.22, glucose 924. LFTs within normal limits. WBC 7.9, hemoglobin 13, platelets of 224. Urinalysis negative for evidence of infection.   ASSESSMENT  AND PLAN: [name] Caucasian gentleman with  history of type 1 diabetes, presenting with diabetic ketoacidosis.  1. Diabetic ketoacidosis: Admit to critical care unit. Run intravenous fluid normal saline at 250 mL an hour. Insulin drip at 0.1 units/kg per hour. Fingersticks q.1 hour. BMP q.4 hours. When glucose is less than 200, will change intravenous fluid to D5 half normal saline at 200 mL an hour. Decrease insulin drip to 0.05 units/kg per hour at that time. When anion gap closes, will administer subcutaneous insulin at 0.5 to 0.8 units/kg per day and discontinue insulin drip 1 hour after.  2. His potassium is greater than 5.3. He does not require any potassium supplementation at this time. If it, however, does drop on the next BMP, will add 20 mEq to the intravenous fluid.  3. Venous thromboembolism prophylaxis: Heparin subcutaneous.   The patient is FULL CODE.   CRITICAL CARE TIME SPENT: 45 minutes.   ____________________________ Cletis Athensavid K. Hower, MD dkh:gb D: 01/19/2014 22:54:10 ET T: 01/20/2014 00:06:38 ET JOB#: [id number]  cc: Cletis Athensavid K. Hower, MD, <Dictator> DAVID Synetta ShadowK HOWER MD ELECTRONICALLY SIGNED 01/20/2014 1:07

## 2015-02-12 NOTE — Discharge Summary (Signed)
PATIENT NAME:  Mark [name], Mark [name] MR#:  [id number] DATE OF BIRTH:  29-Feb-1986  DATE OF ADMISSION:  06/01/2014 DATE OF DISCHARGE:  06/03/2014  PRIMARY CARE PHYSICIAN:  Will be at the [facility].   REFERRAL: [facility].   FINAL DIAGNOSES: 1.  Hyperosmolar state.  2.  Abdominal pain, nausea, and vomiting.  3.  Acute renal failure, which improved.  4.  Tobacco abuse.   MEDICATIONS ON DISCHARGE: Include, NovoLog Mix 70/30 of 45 units subcutaneous injection at bedtime, 65 units subcutaneous injection during the day; nicotine patch 21 mg chest wall film one patch daily; omeprazole 40 mg daily, Zofran 4 mg every 8 hours as needed for nausea and vomiting.   DIET: Carbohydrate-controlled diet, regular consistency.   FOLLOW-UP: At the [facility] 1 to 2 weeks with the [facility].   HOSPITAL COURSE: The patient was admitted 06/01/2014, and discharged 06/03/2014; came in with elevated blood sugar, nausea, vomiting; admitted with hyperglycemia, hyperosmolar state. The patient was given IV fluid hydration, started on insulin drip, and then switch back to his usual 70/30. Patient ran out of his insulin he states.   LABORATORY AND RADIOLOGICAL DATA DURING THE HOSPITAL COURSE: Included a glucose of 1039, BUN 18, creatinine 1.68, sodium 123, potassium 4.9, chloride 86, CO2 of 21, calcium 9.3; liver function tests, total bilirubin 1.3, alkaline phosphatase 121, ALT 17, AST 16; white blood cell count 9.1, hemoglobin and hematocrit 15.0 and 47.9, platelet count of 274,000, urinalysis glucose greater than 500, glucose greater than 600.  EKG, normal sinus rhythm, anterior infarct, age undetermined; a repeat chemistry on the 11th showed a glucose 191; BUN 11, creatinine 0.93, sodium 135, potassium 3.5, chloride 100, CO2 of 27, calcium 8.8; white blood cell count upon discharge 8.6, hemoglobin 13.5, platelet count of 223,000, glucose 0.72, potassium 3.5.   HOSPITAL COURSE PER  PROBLEM LIST:  1.  For the hyperosmolar state with diabetes. The patient states that he runs out of insulin; whether  it is him running out or noncompliance, he comes into the hospital way too often. We will refer to the [facility] for better control and help out with his medications. Manager did set him up with vials of insulin, I also wrote a script for his glucometer. His overall prognosis is poor since he keeps on coming in with the same issue.  2.  Abdominal pain, nausea, and vomiting. The patient stated he wanted to figure out what his abdominal pain was all about. I told him I can do a CAT scan, if he did not eat breakfast; the patient then proceeded with eating breakfast, I guess his abdominal pain was not that bad. He was discharged home after eating breakfast.  3.  Acute renal failure. This improved with intravenous fluid hydration.  4.  Tobacco abuse. Smoking cessation counseling done on presentation, patient was given a nicotine patch to go home with.  5.  Hyponatremia secondary to hyperosmolar coma.   TIME SPENT ON DISCHARGE:  Was 35 minutes.    ____________________________ Herschell Dimesichard J. Renae GlossWieting, MD rjw:nt D: 06/03/2014 14:08:23 ET T: 06/03/2014 15:32:46 ET JOB#: [id number]  cc: Herschell Dimesichard J. Renae GlossWieting, MD, <Dictator> [facility] Salley ScarletICHARD J Elise Gladden MD ELECTRONICALLY SIGNED 06/25/2014 15:09

## 2015-02-12 NOTE — H&P (Signed)
PATIENT NAME:  Mark [name], Mark [name] MR#:  [id number] DATE OF BIRTH:  1986-07-03  DATE OF ADMISSION:  07/15/2014  REFERRING PHYSICIAN: Enedina Finner. Manson Passey, MD   PRIMARY CARE PHYSICIAN: [facility].   CHIEF COMPLAINT: Nausea, vomiting, abdominal pain.   HISTORY OF PRESENT ILLNESS:  [name] Caucasian gentleman with a past medical history of type 1 diabetes originally diagnosed around age 18 as well as a history of medical noncompliance presenting with nausea, vomiting, abdominal pain. States that he has had decreased dosages of insulin for the last 2 days in total secondary to monetary issues. He is actually unsure of the exact amount he has been taking. He states that he usually takes "3 vials" however he has only been taking 2 vials lately once again secondary to monetary issues, the last full dose of insulin about 2 to 3 days ago, now presenting with nausea, vomiting, abdominal pain. Describes abdominal pain as crampy, periumbilical, nonradiating, 4/10 intensity with as well as associated denies blurred vision. Denies fevers, chills, further symptomatology.   REVIEW OF SYSTEMS:  CONSTITUTIONAL: Denies fevers, chills. Positive for fatigue, weakness.  EYES: Positive for blurred vision. Denies double vision, eye pain.  EARS, NOSE, THROAT: Denies tinnitus, ear pain, hearing loss.  RESPIRATORY: Denies cough, wheeze, shortness of breath.  CARDIOVASCULAR: Denies chest pain, palpitations, edema.  GASTROINTESTINAL: Positive for nausea, vomiting, abdominal pain as described above.  GENITOURINARY: Denies dysuria, hematuria.  ENDOCRINE: Positive for increased urination as well as thirst.   HEMATOLOGY/LYMPHATICS: Denies easy bruising, bleeding.  SKIN: Denies rashes or lesions.  MUSCULOSKELETAL: Denies pain in neck, back, shoulder, knees, hips or arthritic symptoms.  NEUROLOGIC: Denies paralysis, paresthesias.  PSYCHIATRIC: Denies anxiety or depressive symptoms.   Otherwise, full review of systems  performed by me is negative.   PAST MEDICAL HISTORY: Type 1 diabetes originally diagnosed around age 55, multiple hospitalizations with DKA secondary to medical noncompliance, as well as tobacco abuse.   SOCIAL HISTORY: Continued daily tobacco usage. Denies any alcohol or drug usage.   FAMILY HISTORY: Positive for diabetes.   ALLERGIES: AUGMENTIN, CLINDAMYCIN, PENICILLIN AND SULFA DRUGS.   HOME MEDICATIONS: Include insulin 70/30 requiring 45 units in the morning, 65 units in the evening.   PHYSICAL EXAMINATION:  VITAL SIGNS: Temperature 98, heart rate 116, respirations 20, blood pressure 117/70, saturating 99% on room air. Weight 95.3 kg. BMI 27.  GENERAL: Somewhat disheveled Caucasian gentleman currently in no acute distress.  HEAD: Normocephalic, atraumatic.  EYES: Pupils equal, round, reactive to light. Extraocular muscles intact. No scleral icterus.  MOUTH: Dry mucosal membrane. Dentition intact. No abscess noted.  EARS, NOSE, THROAT: Clear without exudates. No external lesions.  NECK: Supple. No thyromegaly. No nodules. No JVD.  PULMONARY: Clear to auscultation bilaterally without wheezes, rubs, rhonchi. No use of accessory muscles. Good respiratory effort.  CHEST: Nontender to palpation.  CARDIOVASCULAR: S1, S2, tachycardic without murmurs, rubs, gallops. No edema. Pedal pulses 2+ bilaterally.  GASTROINTESTINAL: Soft, nontender, nondistended. No masses. Positive bowel sounds. No hepatosplenomegaly.  MUSCULOSKELETAL: No swelling, clubbing, or edema. Range of motion full in all extremities.  NEUROLOGIC: Cranial nerves II through XII intact. No gross focal neurologic deficits. Sensation intact.  Reflexes intact.    SKIN: No ulceration, lesions, rash, cyanosis. Skin warm, dry. Turgor intact  PSYCHIATRIC: Mood and affect within normal limits. The patient is awake, alert, oriented x3. Insight, judgment intact.   LABORATORY DATA: Sodium 122, potassium 5.7, chloride 88, bicarbonate of 11,  anion gap of 27, BUN 25, creatinine 1.67,  glucose 960. LFTs: Total protein 8.3, alkaline phosphatase 135, AST 3, otherwise, within normal limits. WBC of 23.1, hemoglobin 14.5, platelets of 357. Urinalysis negative for evidence of infection, however, positive for 2+ ketones.   ASSESSMENT AND PLAN: [name] gentleman with history of type 1 diabetes originally diagnosed around age 29 with a history of medical noncompliance requiring multiple admissions secondary to diabetic ketoacidosis presenting with diabetic ketoacidosis.   1.  Diabetic ketoacidosis.  We will admit to CCU under step-down status. Provide IV fluid hydration with normal saline 250 mL an hour as well as the initiation of insulin starting insulin drip at 0.1 units/kg per hour.  We will follow fingersticks q.1 hour, BMP q.4 hours. When glucose is less than 200, we will change IV fluids to D5 half normal saline with 20 mEq running at 200 mL an hour and decrease insulin drip at that time to 0.05 units/kg per hour. When anion gap closes, we will administer his subcutaneous insulin 0.5 to 0.8 units/kg per day and discontinue insulin drip about 1 hour after that. In addition to the fluids mentioned above, we will add 20 mEq of potassiumto the IV fluids when potassium is between 3.3 and 5.3 and continue to follow this with BMPs as needed.  2.  Venous thromboembolism prophylaxis, with heparin subcutaneous.  3.  The patient is full code.   CRITICAL CARE TIME SPENT: 45 minutes   ____________________________ Cletis Athensavid K. Mayetta Castleman, MD dkh:AT D: 07/15/2014 02:03:08 ET T: 07/15/2014 03:01:10 ET JOB#: [id number]  cc: Cletis Athensavid K. Rheanna Sergent, MD, <Dictator> Karisa Nesser Synetta ShadowK Nikki Glanzer MD ELECTRONICALLY SIGNED 07/15/2014 20:32

## 2015-02-12 NOTE — Consult Note (Signed)
PATIENT NAME:  Mark [name], Mark [name] MR#:  [id number] DATE OF BIRTH:  1986/01/16  DATE OF CONSULTATION:  07/19/2014  REFERRING PHYSICIAN:   CONSULTING PHYSICIAN:  Audery AmelJohn T. Coriana Angello, MD  IDENTIFYING INFORMATION AND REASON FOR CONSULTATION: [name] man currently in the hospital for diabetic ketoacidosis. Consult out of concern about his noncompliance with treatment.   HISTORY OF PRESENT ILLNESS: Information obtained from the patient and the chart. The patient states that this is his fourth time in a row coming into the hospital with diabetic ketoacidosis. He is aware that this is happening and states that recently he and his mother have fallen on hard times. They have had a lot of difficulties with finances. He has not been able to get his insulin filled as usual and as a result has come into the hospital in diabetic ketoacidosis. He says that he has hoped that he could get some outpatient insulin from his inpatient hospitalizations, but that so far this has not happened. He says that his mood currently is feeling all right. Denies significant depression. Does have some difficulty chronically with sleep at home. Appetite is okay. Denies auditory or visual hallucinations. Denies suicidal or homicidal ideation. Denies racing thoughts, grandiosity or any symptoms of mania. The patient is able to articulate an understanding of diabetic ketoacidosis, most importantly that it is a potentially fatal condition and is preventable in most cases by close adherence to prescribed treatment of diabetes.   PAST PSYCHIATRIC HISTORY: The patient has a distant history of a diagnosis of bipolar disorder. The patient says that he believes that that was based around anger management problems that he had in the past. He said all of those of have cleared up ever since his mother has had cancer and he has had to take care of her at home. He has not been getting any outpatient psychiatric treatment or taking any psychiatric medicine  in a long time. It does not look like in any of the recent admissions, there had been previous concern about psychiatric illness. He denies any past history of suicide attempts. Denies having been violent in the past. Admits that he has a history of marijuana abuse.   SUBSTANCE ABUSE HISTORY: Continues to use marijuana on a regular basis. Says it is the only thing that makes him really happy. Denies that he is abusing any other drugs. Denies alcohol use.   SOCIAL HISTORY: The patient lives with his mother. She has cancer and he says she requires a significant amount of care from him. He is the primary caretaker for her. This is his main emotional attachment. He does have several older siblings who sometimes come by and provide some assistance as well. Otherwise limited social activity.   FAMILY HISTORY: No known family history of mental illness.   PAST MEDICAL HISTORY: The patient has insulin-dependent diabetes and has had multiple diabetic ketoacidosis hospitalizations, as well as other complications from diabetes.   CURRENT MEDICATIONS: Currently in the hospital receiving insulin 40 units 70/30 mix at suppertime along with sliding scale insulin, Zofran p.r.n. for nausea.   ALLERGIES: AUGMENTIN, CLINDAMYCIN, PENICILLIN AND SULFA DRUGS.   REVIEW OF SYSTEMS: Denies depression. Denies suicidal or homicidal ideation. Denies hallucinations. Denies mania or racing thoughts. Has some chronic difficulty sleeping. Nausea is improving. Has been able to eat a little bit better today. Less pain in the abdomen. Less dizzy. Has been able to get up and move around. Otherwise, full review of systems negative.   MENTAL STATUS EXAMINATION: A  somewhat disheveled gentleman who looks his stated age. He was cooperative with the interview. Good eye contact. Normal psychomotor activity. Speech normal rate, tone and volume. Affect is euthymic, reactive, appropriate. Mood is stated as being okay. Thoughts are lucid without  any loosening of associations. No sign of delusions. Denies auditory or visual hallucinations. Denies suicidal or homicidal ideation. Judgment and insight currently improved. Short and long-term memory intact. Able to remember 3/3 objects immediately and at 3 minutes.   LABORATORY RESULTS: Multiple laboratories obtained during his hospitalization. On admission, lipase low, creatinine elevated at 1.36, glucose on admission 795. This has come down gradually as would be expected. Multiple repeat drawings of the chemistry panels. Continues to run some high glucoses today, up in the 400s.   ASSESSMENT: [name] man with diabetes with repeated episodes of diabetic ketoacidosis. Today, he is lucid, alert, oriented and does not show any signs of delirium. Denies symptoms of major depression or mania. Denies any psychotic symptoms. The patient's rationale for his repeated admissions with DKA are understandable, even if perhaps not consistent with the decisions everyone would make. He does have no insurance and no way to pay for insulin. I  refrained from pointing out to him that perhaps money spent on marijuana could be better spent on insulin. Nevertheless, there does not seem to be any reason to think that he is suicidal or intentionally trying to have DKA. He understands that this is a very dangerous condition. He tells me that if he had his insulin he would certainly use it regularly. He was asked about his urinating in an adult diaper earlier. He tells me that when he had first been admitted he was unable to control his urination because of his medical condition. Condom catheter was not working. He did transiently require an adult diaper. He tells me very clearly that he has no preference to wear adult diapers and has no intention of continuing to do so at home. The patient does seem to have some dependent features which is a pretty common issue in some patients with chronic severe medical illnesses. He does  not appear to have, however, a major axis I disorder.   TREATMENT PLAN: Supportive and educational counseling. Encouraged him to make use of all resources to try and get some insulin and some way to check his blood sugars at home. Repeated education about diabetes. No indication for any psychiatric medicine or any psychiatric treatment at this point.   DIAGNOSIS, PRINCIPAL AND PRIMARY:  AXIS I: Adjustment disorder with disturbance of mood.   SECONDARY DIAGNOSES:  AXIS I: Marijuana abuse.  AXIS II: Dependent features.    ____________________________ Audery Amel, MD jtc:TT D: 07/19/2014 16:51:45 ET T: 07/19/2014 17:12:46 ET JOB#: [id number]  cc: Audery Amel, MD, <Dictator> Audery Amel MD ELECTRONICALLY SIGNED 07/29/2014 10:33

## 2015-02-12 NOTE — Consult Note (Signed)
Brief Consult Note: Diagnosis: adjustment disorder.   Patient was seen by consultant.   Consult note dictated.   Comments: Psychiatry: Consult for this gentleman with insulin-dependent diabetes and multiple hospitalizations for diabetic ketoacidosis.  Concern about noncompliance with treatment.  Current examination does not support any specific psychiatric diagnosis.  Patient is calm and pleasant in conversation.  Appears to be rational.  Exhibits a appropriate understanding of his illness.  Full note completed.  Chart reviewed.  No recommendation for any specific psychiatric treatment at this point.  Electronic Signatures: Roxana Lai, Jackquline DenmarkJohn T (MD)  (Signed 28-Sep-15 16:42)  Authored: Brief Consult Note   Last Updated: 28-Sep-15 16:42 by Audery Amellapacs, Shontez Sermon T (MD)

## 2015-02-13 NOTE — Discharge Summary (Signed)
PATIENT NAME:  Mark [name], Mark [name] MR#:  [id number] DATE OF BIRTH:  [birth year]  DATE OF ADMISSION:  02/10/2012 DATE OF DISCHARGE:  02/12/2012  PRIMARY CARE PHYSICIAN: None at the time of admission. The patient is being referred to the [facility] for establishment and continuation of medical care.    REASON FOR ADMISSION: Back pain, nausea, vomiting and diabetic ketoacidosis.   DISCHARGE DIAGNOSES:  1. Diabetic ketoacidosis secondary to medication noncompliance.  2. Medication noncompliance.  3. Acute renal failure, prerenal etiology, resolved.  4. Leukocytosis, felt to be stress induced, resolved.  5. Hypocalcemia, mild, resolved.  6. History of hypertension, however, the patient has been persistently normotensive during this hospitalization without any blood pressure medications and also not taking any blood pressure medication at home. 7. Dyslipidemia with hypercholesterolemia and hypertriglyceridemia for which the patient has been started on a trial of diet and exercise control.  8. Insulin-dependent diabetes mellitus presenting with diabetic ketoacidosis with poorly controlled diabetes mellitus. Hemoglobin A1c 10.3 due to medication noncompliance. 9. Chronic back pain. 10. History of narcotic abuse.  11. Tobacco abuse.  12. Bipolar disorder. 13. 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 14. History of nephrolithiasis.  15. History of diabetic ketoacidosis in the past, approximately five episodes in the past per the patient. 16. History of substance abuse.   CONSULTATIONS: None.   DISCHARGE DISPOSITION: Home.    DISCHARGE MEDICATIONS:  1. Novolin 70/30, take 50 units subcutaneous q.a.m. and 35 units subcutaneous q. p.m.  2. Aspirin 81 mg p.o. daily. 3. Tylenol 325 mg 1 to 2 tablets p.o. every 4 to 6 hours p.r.n. pain.   DISCHARGE CONDITION: Improved, stable.   DISCHARGE ACTIVITY: As tolerated.   DISCHARGE DIET: Low sodium, ADA, low fat,  low cholesterol, low triglyceride.   DISCHARGE INSTRUCTIONS:  1. Take medications as prescribed.  2. Return to the Emergency Department for recurrence of symptoms or for any development of abdominal pain, nausea, vomiting, fevers, chills or worsening of chronic back pain.   FOLLOW-UP INSTRUCTIONS: Follow-up at [facility] within 1 to 2 weeks.   PROCEDURES: Portable chest x-ray 02/10/2012: No acute cardiopulmonary abnormalities are noted.   PERTINENT LABORATORY DATA: Serum glucose 579 on admission with BUN 21, creatinine 1.69, sodium 130, potassium 5.5, anion gap 29, bicarbonate of 9. Repeat BMP 02/10/2012 with serum glucose 176, BUN 9, creatinine 1.25, sodium 137, potassium 3.7, bicarbonate 19, anion gap 12. Serum calcium 9.1 on admission, 8.1 on 02/11/2012, normal at 8.5 on 02/12/2012. Lipid panel: total cholesterol 232, triglycerides 332, HDL 36, LDL 130. Hemoglobin A1c 10.3. Liver function tests on admission normal. CBC on admission normal except for WBC of 18.7. WBC 9.8 on the day of discharge. Urine drug screen was positive for opiates and cannabinoids. Urinalysis on admission greater than 500 mg/dl glucose, 2+ ketones with 30 mg/dL of protein, negative nitrite, leukocyte esterase, 1 WBC, no bacteria.   BRIEF HISTORY/HOSPITAL COURSE: The patient is a 29 year old male with past medical history of insulin-dependent diabetes mellitus with recurrent episodes of diabetic ketoacidosis due to medication noncompliance, substance and tobacco abuse, hypertension, hyperlipidemia, bipolar disorder, nephrolithiasis, chronic back pain, who presents to the Emergency Department for complaints of nausea, vomiting and back pain. Please see dictated admission history and physical for pertinent details surrounding the onset of this hospitalization. Please see below for further details.  1. Diabetic ketoacidosis, as evidenced by profoundly elevated serum glucose levels in addition to anion gap metabolic acidosis  and ketones in his urine consistent  with diabetic ketoacidosis for which the patient was admitted to the Critical Care Unit with management of his diabetic ketoacidosis in standard fashion with initially being maintained n.p.o. and starting him on insulin drip as well as IV fluids. He was also given some antiemetics and pain control. Thereafter, his serum glucose levels became much better controlled. His nausea and vomiting have resolved. His back pain has significantly improved and he is without any back pain at the time of discharge. He does have a history of chronic back pain. His diabetic ketoacidosis was due to medication noncompliance as he stated that he had not taken his insulin in approximately a week. Once his anion gap had closed, he was transitioned off insulin drip onto subcutaneous insulin and was also started on a diet. He has been restarted back on 70/30, the dose that he was prescribed in the past and sugars have been much better controlled thereafter. He was also given some sliding scale insulin once he was off the insulin drip. He will need close outpatient follow-up and management and was advised to check his sugars often and was advised to not miss his insulin. He was strongly counseled and advised to remain compliant and adherent with his prescribed insulin regimen. Due to poorly controlled diabetes mellitus as evidenced by hemoglobin A1c of 10.3 as well as hypercholesterolemia and previous history of hypertension, he has been started on aspirin for primary prevention. Another risk factor will be tobacco abuse. He was noted to be dyslipidemic with hypercholesterolemia and hypertriglyceridemia for which the patient wishes to start a trial of diet and exercise control before being started on medications which was reasonable. He was advised to remain adherent to a low fat, low cholesterol, and low triglyceride diet.  2. History of hypertension. The patient has been persistently normotensive  throughout the entirety of this hospitalization, off of any blood pressure medications. Therefore, he has not been started on any antihypertensives at the time of discharge and he was advised a low sodium diet and will need close outpatient follow-up in regards to blood pressure management.  3. Leukocytosis, felt to be stress induced from diabetic ketoacidosis. The patient was afebrile on admission. There are no obvious signs or source of infection. Urinalysis was not indicative of urinary tract infection and chest x-ray was benign. He denied any fevers or chills. He was initially placed on empiric antibiotics, but infectious in etiology of his leukocytosis felt to be less likely at this time and WBC count has normalized. Therefore, his antibiotics have been discontinued at the time of discharge.  4. Acute renal failure, of prerenal etiology from volume depletion caused by vomiting and decreased p.o. intake due to diabetic ketoacidosis and after rehydration with IV fluids, his renal function has normalized and he is nonoliguric.  5. Narcotic abuse. The patient stated that he was buying Percocet off the streets from his friends and was taking these medications for his chronic back pain and he denied any intravenous drug use or abuse. I had counseled and advised the patient to take only those medications which are prescribed to him by his primary care physician and stated to him that he should not be buying any narcotics off the streets and he was recommended p.r.n. Tylenol at the time of discharge for pain control. He was without any back pain or any pain whatsoever at the time of discharge.  6. Hypocalcemia, mild with normal albumin level and calcium was supplemented with calcium carbonate and thereafter serum calcium level  had normalized.  7. Chronic back pain, stable. The patient was without any back pain once his diabetic ketoacidosis had resolved and he is without any back pain at the time of discharge.  Therefore, he was advised to stop taking narcotics and only use p.r.n. Tylenol.  8. Tobacco abuse. The patient was strongly counseled on the importance of smoking cessation.   On 02/12/2012, the patient was hemodynamically stable and without any back pain, nausea or vomiting and his sugars were much better controlled and his renal function and WBC count had normalized and he was felt to be stable for discharge home with close outpatient follow-up to which the patient was agreeable.  TIME SPENT ON DISCHARGE: Greater than 30 minutes.   ____________________________ Elon Alas, MD knl:ap D: 02/16/2012 12:06:24 ET T: 02/17/2012 11:46:36 ET JOB#: [id number]  cc: Elon Alas, MD, <Dictator> [facility] Elon Alas MD ELECTRONICALLY SIGNED 02/19/2012 18:52

## 2015-02-13 NOTE — Discharge Summary (Signed)
PATIENT NAME:  Mark [name], Mark [name] MR#:  [id number] DATE OF BIRTH:  1986/05/24  DATE OF ADMISSION:  02/20/2012 DATE OF DISCHARGE:  02/22/2012   ADMITTING DIAGNOSES: Nausea, vomiting, and hyperglycemia.   DISCHARGE DIAGNOSES:  1. Nausea, vomiting, hyperglycemia due to patient running out of insulin with severe hyperglycemia, now blood glucose much improved.  2. Diabetes, insulin dependent, very poorly controlled due to medication noncompliance.  3. Chronic back pain.  4. Bipolar disorder.   5. History of hypertension but blood pressure has been normal.  6. Hyperlipidemia.  7. History of MRSA.  8. History of nephrolithiasis.  9. History of substance abuse.  10. Medical noncompliance.  ADMITTING LABS: Glucose 875, BUN 32, creatinine 1.14, sodium 137, potassium 4.9, chloride 100, CO2 26, anion gap 11. LFTs total protein 8.6, otherwise LFTs were normal. WBC 7.8, hemoglobin 14.5, platelet count 317. Hemoglobin A1c 10.7. Fasting lipid panel total cholesterol 220, triglycerides 207, HDL 45, LDL 130. TUDS positive for THC. Admitting WBC count 7.8, hemoglobin 14.5, platelet count 317.   HOSPITAL COURSE: Please see history and physical done by me. The patient is a 29 year old white male with recurrent admissions for DKA, hyperglycemia who is on 70/30 insulin. The patient presented to the ED with complaint of nausea and vomiting. In the ED he was noted to have a blood glucose of 875. Initially the patient told me that he did not take his evening dose of insulin and then subsequently he tells me that he did not have the money to buy the insulin. The patient also has [facility] for medical follow-up and he has not kept them. The patient was started on insulin drip and subsequently switched to 70/30. He did not have evidence of DKA but did have severe hypertriglyceridemia. The patient's blood sugars have improved. I have encouraged him to take his  medications and try to follow-up. At this time he is stable for discharge.   DISCHARGE MEDICATIONS:  1. 70/30 50 units sub-[name] [name].a.m. and 35 units in the evening. 2. Aspirin 81 mg 1 tab daily. 3. Tylenol 650 1 to 2 tabs [name].4 to 6 p.r.n. pain.   DIET: ADA, low fat.   ACTIVITY: As tolerated.   TIMEFRAME FOR FOLLOW-UP: [facility]. The patient needs to make sure he follows up at the [facility] and is to make sure he takes his medications. Will provide him with some insulin for short-term from the hospital.   TIME SPENT: 35 minutes.   ____________________________ Lacie ScottsShreyang H. Allena KatzPatel, MD shp:drc D: 02/22/2012 08:54:00 ET T: 02/22/2012 13:33:04 ET JOB#: [id number]  cc: Haddy Mullinax H. Allena KatzPatel, MD, <Dictator> [facility] Charise CarwinSHREYANG H Elpidia Karn MD ELECTRONICALLY SIGNED 02/22/2012 15:27

## 2015-02-13 NOTE — Discharge Summary (Signed)
PATIENT NAME:  Mark [name], Mark [name] MR#:  [id number] DATE OF BIRTH:  08/26/1986  DATE OF ADMISSION:  03/14/2012 DATE OF DISCHARGE:  03/17/2012  ADMITTING DIAGNOSIS: Pain in the buttocks region with abscesses.    DISCHARGE DIAGNOSES:  1. Multiple buttock abscesses status post incision and drainage of multiple buttock abscesses, status post drain placed to the left large buttock abscess.  2. Medication noncompliance.  3. Diabetes type 1 with very poor diabetes control.  4. Tobacco abuse.  5. Bipolar disorder.  6. Hyponatremia due to volume depletion, now normalized.  7. Multiple admissions for diabetic ketoacidosis.   CONSULTANT: Dr. Excell Seltzerooper.   PROCEDURE: Incision and drainage of buttock abscess.   LABORATORY, DIAGNOSTIC AND RADIOLOGICAL DATA: BMP: Glucose 385, BUN 9, creatinine 0.86, sodium 132, potassium 4.2, chloride 97, CO2 25, hemoglobin A1c 10.4. LFTs showed total protein 8.8, albumin 3.7, AST 10. With hydration total protein came down to 6.8. WBC on presentation 17.7, hemoglobin 13.3. Most recent WBC on 05/26 was 6.9. Blood cultures: No growth. Wound culture shows unidentified organism heavy growth.   HOSPITAL COURSE: Please refer to history and physical done by the admitting physician. Patient is a 29 year old white male who has been admitted multiple times due to noncompliance with his diabetic regimen presented to the ED with complaint of having pain in his buttocks region with multiple boils. Patient had incision and drainage done in the ED. We were asked to admit the patient. Based on Dr. Mathews RobinsonsWieting's evaluation who admitted the patient felt that he needed further incision and drainage therefore surgical consult was obtained. Dr. Excell Seltzerooper came and saw the patient and did an incision and drainage of abscesses and put a drain in one of the larger abscesses. Patient was continued on antibiotics. His wound is improving and has been cleared by Dr. Excell Seltzerooper for discharge with a follow up with him to  remove the drain.   DISCHARGE MEDICATIONS:  1. Aspirin 81, 1 tab p.o. daily.  2. Tylenol 650 [name].6 p.r.n. pain.  3. Novolin 70/30, 35 units in the morning. 4. Novolin 70/30, 50 units at bedtime.  5. Clindamycin 600 p.o. [name].8 x7 days. 6. Percocet 5/325 p.o. [name].6 p.r.n. pain.  7. Motrin 400 p.o. [name].8 p.r.n. pain. 8. Cipro 500 mg p.o. [name].12 x7 days.  DRESSING CARE: Place ABD pad to buttocks daily.   HOME OXYGEN: None.   DIET: ADA diet, low fat.   ACTIVITY: As tolerated.   TIMEFRAME FOR FOLLOW UP: 1 to 2 weeks with Dr. Excell Seltzerooper for follow up for the drain removal.   TIME SPENT: 35 minutes.   ____________________________ Lacie ScottsShreyang H. Allena KatzPatel, MD shp:cms D: 03/17/2012 12:07:19 ET T: 03/18/2012 10:04:44 ET JOB#: [id number]  cc: Pranit Owensby H. Allena KatzPatel, MD, <Dictator> Charise CarwinSHREYANG H Docie Abramovich MD ELECTRONICALLY SIGNED 03/20/2012 13:37

## 2015-02-13 NOTE — H&P (Signed)
PATIENT NAME:  Mark [name], Mark [name] MR#:  [id number] DATE OF BIRTH:  05-25-1986  DATE OF ADMISSION:  04/20/2012  REFERRING PHYSICIAN: Maricela Bo, MD  FAMILY PHYSICIAN: None local.   REASON FOR ADMISSION: Intractable nausea and vomiting with dehydration and diabetic ketoacidosis.   HISTORY OF PRESENT ILLNESS: The patient is a 29 year old male with a history of type 2 diabetes on insulin as well as chronic pain and bipolar disorder. The patient was recently hospitalized apparently in [place] approximately 1 to 2 weeks ago with pneumonia and respiratory failure requiring intubation. He had DKA at the time. He presents now with nausea, vomiting, diarrhea, and dehydration. He has had blurry vision and increased thirst. In the Emergency Room, the patient was found to have a blood sugar greater than 600 with an anion gap consistent with diabetic ketoacidosis. He is now admitted for further evaluation.   PAST MEDICAL HISTORY:  1. Type 2 diabetes, on insulin.  2. Chronic low back pain.  3. Hyperlipidemia.  4. Benign hypertension.  5. Bipolar disorder.  6. History of MRSA.  7. Nephrolithiasis.  8. Recent pneumonia with respiratory failure requiring intubation.   MEDICATIONS:  1. Aspirin 81 mg p.o. daily.  2. Clindamycin 600 mg p.o. every 8 hours.  3. Novolin 70/30 insulin 35 units subcutaneous every a.m. and 50 units subcutaneous every p.m.  4. Percocet 5/325 mg one p.o. every 6 hours p.r.n. pain.   ALLERGIES: Penicillin, Augmentin, and sulfa.   SOCIAL HISTORY: The patient does smoke. Denies alcohol abuse.   FAMILY HISTORY: Positive for diabetes and hypertension.   REVIEW OF SYSTEMS: CONSTITUTIONAL: The patient does admit to low-grade fever. Denies change in weight. EYES: Has had blurred vision but no glaucoma. ENT: No tinnitus or hearing loss. No nasal discharge or bleeding. No difficulty swallowing. RESPIRATORY: No cough or wheezing. Denies hemoptysis. CARDIOVASCULAR: No chest pain or  orthopnea. No syncope although he does have palpitations. GI: Nausea, vomiting, and diarrhea as per history of present illness. Some generalized abdominal pain. GU: No dysuria or hematuria. No incontinence. ENDOCRINE: No polyuria or polydipsia. No heat or cold intolerance. HEMATOLOGIC: The patient denies anemia, easy bruising, or bleeding. LYMPHATIC: No swollen glands. MUSCULOSKELETAL: The patient denies pain in his neck, back, shoulders, knees, or hips. No gout. NEUROLOGIC: The patient has had numbness and weakness. Denies migraines, stroke, or seizures. PSYCH: The patient denies anxiety, insomnia, or depression.   PHYSICAL EXAMINATION:   GENERAL: The patient is in no acute distress.   VITAL SIGNS: Currently remarkable for a blood pressure of 116/61 with a heart rate of 97 and a respiratory rate of 18. He is afebrile.   HEENT: Normocephalic, atraumatic. Pupils are equally round and reactive to light and accommodation. Extraocular movements are intact. Sclerae are anicteric. Conjunctivae are clear. Oropharynx is dry but clear.   NECK: Supple without jugular venous distention or bruits. No adenopathy or thyromegaly was noted.   LUNGS: Basilar rhonchi without wheezes or rales. No dullness.   CARDIAC: Regular rate and rhythm with normal S1 and S2. No significant rubs, murmurs, or gallops. PMI is nondisplaced. Chest wall is nontender.   ABDOMEN: Soft and nontender with normoactive bowel sounds. No organomegaly or masses were appreciated. No hernias or bruits were noted.   EXTREMITIES: No clubbing, cyanosis or edema. Pulses were 2+ bilaterally.   SKIN: Warm and dry without rash or lesions.   NEUROLOGIC: Cranial nerves II through XII grossly intact. Deep tendon reflexes were symmetric. Motor and sensory exam is nonfocal.  PSYCH: The patient was alert and oriented to person, place, and time. He was cooperative and used good judgment.   LABORATORY DATA: White count was 13.5 with a hemoglobin of  12.5. Glucose was 684 with a BUN of 24 and a creatinine of 1.09 with a sodium of 130 and an anion gap of 24. His albumin was 3.2. 2+ ketones in the urine. pH on venous stick was 7.27.   ASSESSMENT:  1. Diabetic ketoacidosis.  2. Intractable nausea and vomiting with dehydration.  3. Hyponatremia.  4. Low albumin consistent with mild malnutrition.  5. Unspecified anemia.  6. Diarrhea.  7. Current antibiotic use with recent pneumonia and respiratory failure.   PLAN: The patient will be admitted to Intensive Care Unit on IV fluids and an insulin drip. We will monitor his sugars hourly and wean the drip as tolerated. We will send off stool for C. difficile and blood cultures. We will continue IV antibiotics. We will check a chest x-ray at this time given his recent history of respiratory failure and pneumonia. We will follow his oxygenation closely. We will use Zofran as needed for nausea and vomiting. Follow-up routine            labs in the morning. We will obtain diabetic teaching for the patient while he is in the hospital. Further treatment and evaluation will depend upon the patient's progress.   TOTAL TIME SPENT ON THIS PATIENT: 50 minutes.  ____________________________ Duane LopeJeffrey D. Judithann SheenSparks, MD jds:slb D: 04/20/2012 12:34:43 ET T: 04/20/2012 13:04:32 ET JOB#: [id number]  cc: Duane LopeJeffrey D. Judithann SheenSparks, MD, <Dictator> Aritza Brunet Rodena Medin Briahna Pescador MD ELECTRONICALLY SIGNED 04/20/2012 14:59

## 2015-02-13 NOTE — Op Note (Signed)
PATIENT NAME:  Mark [name], Kellan Q MR#:  [id number] DATE OF BIRTH:  August 20, 1986  DATE OF PROCEDURE:  03/15/2012  PREOPERATIVE DIAGNOSIS: Multiple buttock abscesses.   POSTOPERATIVE DIAGNOSES: Multiple buttock abscesses.    PROCEDURE: Incision and drainage of multiple buttock abscesses.   SURGEON: Dionne Miloichard Cooper, MD   ANESTHESIA: Monitored anesthesia care.   INDICATION: This is a patient with multiple buttock abscesses. He has had these happen in the past as well. Preoperatively, we discussed the rationale for surgery, the options of observation, risk of bleeding, infection, recurrence, and open wound. He understood and agreed to proceed.   FINDINGS: On the left buttock a rather large abscess, singular in nature.  On the right buttock, there were two abscesses smaller and more superficial.   DRAINS: Drains were placed in the left buttock and the larger of the right buttock abscesses. The smaller right buttock abscess was not drained with a Penrose drain.   DESCRIPTION OF PROCEDURE: The patient was induced to monitored anesthesia care in a right lateral recumbent position which was well padded. He was prepped and draped in a sterile fashion. An incision was performed first on the left buttock large abscess. Cultures were taken, and the abscess cavity was probed and inspected; and then a quarter-inch Penrose drain was placed into the cavity, tied in with 3-0 nylon. Attention was turned to the right buttock where both abscesses were incised. No debridement was performed on any of these, only drainage. A quarter-inch Penrose was placed into the larger cavity and tied in with 3-0 nylon. The smaller one was not drained with  a Penrose but just left open. ABD pads were placed. He tolerated the procedure well. Sponge, lap and needle counts were correct. He was taken to the recovery room in stable condition to be admitted for continued care.  ____________________________ Adah Salvageichard E. Excell Seltzerooper,  MD rec:cbb D: 03/15/2012 09:25:25 ET T: 03/15/2012 15:06:17 ET JOB#: [id number] Lattie HawICHARD E COOPER MD ELECTRONICALLY SIGNED 03/16/2012 8:33

## 2015-02-13 NOTE — H&P (Signed)
PATIENT NAME:  Mark [name], Sebert Q MR#:  [id number] DATE OF BIRTH:  09/08/1986  DATE OF ADMISSION:  03/14/2012  PRIMARY CARE PHYSICIAN: None.   REASON FOR ADMISSION: Asked to admit a patient for abscesses on the buttock.   CHIEF COMPLAINT: Pain in the buttock area and boils.    HISTORY OF PRESENT ILLNESS: The patient is a 29 year old man with recurrent severe boils on the buttock, going on now for the last three days, started on the left side and now over on the right side. He feels that they are very large, very painful. He came to the Emergency Room for further evaluation. He does feel dizzy and feels faint. Pain is nine out of 10 in intensity. He cannot get comfortable. He can't sit on them. The ER physician asked me to admit him medically. I asked if they needed to be drained, and they said no. When I did see the patient, I looked at them and they will need surgical drainage. EKG was ordered by me. The patient in the Emergency Room was found to have an elevated white count but no fever. Hospitalist Services were contacted for admission.   PAST MEDICAL HISTORY:  1. Diabetes. 2. Kidney stones. 3. Bipolar disorder. 4. Recurrent boils.   PAST SURGICAL HISTORY: Lithotripsy.   ALLERGIES: Sulfa and penicillin.   MEDICATIONS:  1. 70/30 insulin, 35 units in the a.m., 50 in the p.m.  2. Tylenol p.r.n.   FAMILY HISTORY: Mother with breast cancer. Sister with a bone marrow cancer and another sister with asthma. Father unknown.   SOCIAL HISTORY: He has smoked 3/4 pack per day for the last 12 years. No alcohol. No drug use. He is trying to get Disability.   REVIEW OF SYSTEMS: CONSTITUTIONAL: Negative for fever, negative for chills. Negative for sweats. Positive for fatigue. No weight gain. No weight loss. EYES: No eye issues. 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 No trouble swallowing. CARDIOVASCULAR: No chest pain. Positive for palpitations. RESPIRATORY: Positive for occasional  shortness of breath. Occasional wheeze. No sputum. No hemoptysis. GASTROINTESTINAL: No nausea. No vomiting. No abdominal pain, no diarrhea, no bright red blood per rectum. No melena. GENITOURINARY: No burning on urination. No hematuria. MUSCULOSKELETAL: Positive for joint pains and buttock pain. INTEGUMENTARY: Positive for boils on the buttock. NEUROLOGICAL: Feels faint. PSYCHIATRIC: Positive for bipolar. ENDOCRINE: No thyroid problems. HEMATOLOGIC/LYMPHATIC: No anemia. No easy bruising or bleeding.   PHYSICAL EXAMINATION:  VITAL SIGNS: Temperature 98.8, pulse tachycardic, listed as 133, respirations 20, blood pressure 121/58, pulse oximetry 96% on room air.   GENERAL: No respiratory distress.   HEENT: Eyes: Conjunctivae and lids normal. Pupils are equal, round, and reactive to light. Extraocular muscles are intact. No nystagmus. Ears, nose, mouth, and throat: Left ear closed. Right ear tympanic membrane blocked by wax. Nasal mucosa no erythema. Throat no erythema. No exudate seen. Lips and gums no lesions. Poor dentition.   NECK: No JVD. No bruits. No lymphadenopathy. No thyromegaly. No thyroid nodules palpated.   RESPIRATORY: Lungs are positive wheeze to auscultation. No rhonchi or rales heard. No  use of accessory muscles to breathe.   CARDIOVASCULAR: S1, S2 tachycardic. No gallops, rubs, or murmurs heard. Carotid upstroke 2+ bilaterally. No bruits. Dorsalis pedis pulses 2+ bilaterally. No edema of the lower extremity.   ABDOMEN: Soft and nontender. No organomegaly/splenomegaly. Normoactive bowel sounds. No masses felt.   LYMPHATIC: Positive lymph nodes in the groin, none in the neck.   MUSCULOSKELETAL: No clubbing, edema,  or cyanosis.   SKIN: Positive for buttock abscesses bilaterally. On each buttock, there were two large abscesses that are in need of drainage. Other smaller ones are also felt.   NEUROLOGICAL: Cranial nerves II through XII are grossly intact. Deep tendon reflexes are 1+  bilateral lower extremities.   PSYCHIATRIC: The patient is oriented to person, place, and time.   LABORATORY, DIAGNOSTIC AND RADIOLOGICAL DATA:  White blood cell count 17.7, hemoglobin and hematocrit 13.3 and 40.8, platelet count of 394. Glucose 395, BUN 9, creatinine 0.86, sodium 132, potassium 4.2, chloride 97, CO2 9.3.  Liver function tests: Total protein up at 8.8.   ASSESSMENT AND PLAN:  1. Abscess with leukocytosis and severe pain: I spoke with Dr. Excell Seltzer, who will take the patient to the Operating Room tomorrow morning since he ate today. I will add IV Cipro to the Zyvox that was already started in the Emergency Room. I will continue that. I will get an EKG stat. Tachycardia could be related to pain, but this could also be systemic inflammatory response syndrome.  2. Diabetes, uncontrolled: I will check a hemoglobin A1c. Since the patient is going to the OR tomorrow, we will hold the morning insulin and decrease the evening dose of insulin a little bit and cover with sliding scale. I would rather have the sugars on the higher side than too low.  3. Tobacco abuse: Smoking cessation counseling done three minutes by me. The patient does have a wheeze. We will give albuterol inhaler and a nicotine patch.  4. Bipolar: Not on any medications for this.  5. Hyponatremia: Could be secondary to dehydration. Give IV fluid hydration.  6. Elevated total protein: Could be secondary to dehydration. We will hydrate and recheck again in the a.m. and check a serum protein electrophoresis.   The case was discussed with Surgery.  Dr. Excell Seltzer is to take him to the Operating Room tomorrow.    TIME SPENT ON ADMISSION: 55 minutes.    ____________________________ Herschell Dimes. Renae Gloss, MD rjw:cbb D: 03/14/2012 16:02:17 ET T: 03/14/2012 16:51:41 ET JOB#: [id number]  cc: Herschell Dimes. Renae Gloss, MD, <Dictator>  No primary care physician  Salley Scarlet MD ELECTRONICALLY SIGNED 03/23/2012 14:05

## 2015-02-13 NOTE — H&P (Signed)
PATIENT NAME:  Mark [name], Mark [name] MR#:  [id number] DATE OF BIRTH:  01-Jun-1986  DATE OF ADMISSION:  02/10/2012  REFERRING PHYSICIAN: Dr. Clemens Catholicagsdale  FAMILY PHYSICIAN: None   REASON FOR ADMISSION: Back pain with nausea, vomiting, and diabetic ketoacidosis.   HISTORY OF PRESENT ILLNESS: The patient is a 29 year old male with a significant history of insulin-dependent diabetes, chronic back pain, and kidney stones as well as a history of substance abuse.  He was in prison recently. He has been off his insulin for a week due to financial reasons.  He presents to the Emergency Room with intractable nausea, vomiting, and back pain. In the Emergency Room, the patient was noted to be in diabetic ketoacidosis. He is now admitted for further evaluation.   PAST MEDICAL HISTORY:  1. Chronic low back pain.  2. Bipolar disorder.  3. Benign hypertension.  4. Hyperlipidemia.  5. Insulin-dependent diabetes mellitus.  6. History of MRSA.  7. Nephrolithiasis.   MEDICATIONS:  1. The patient is supposed to be on the Novolin 70/30, 50 units subcutaneous [name]. a.m. and 35 units subcutaneous [name]. p.m.  2. The patient states that he uses Percocet that he buys off the street as needed for pain.   ALLERGIES: Tylenol, penicillin, sulfa, and Augmentin. Ultram causes nausea.   SOCIAL HISTORY: The patient has a history of both alcohol and tobacco abuse.   FAMILY HISTORY: Positive for hypertension and diabetes.   REVIEW OF SYSTEMS: CONSTITUTIONAL: No fever or change in weight. EYES: No blurred or double vision. No glaucoma. ENT: No tinnitus or hearing loss. No nasal discharge or bleeding. No difficulty swallowing. RESPIRATORY: No cough or wheezing. No painful respiration. CARDIOVASCULAR: No chest pain or palpitations. No syncope. GI: No diarrhea. No change in bowel habits. GU: No dysuria. No hematuria. No incontinence. ENDOCRINE: No polyuria or polydipsia. No heat or cold intolerance. HEMATOLOGIC: The patient denies anemia, easy  bruising, or bleeding. LYMPHATIC: No swollen glands. MUSCULOSKELETAL: The patient has pain in his back but none in his neck or shoulders. No knee pain. No gout. NEUROLOGIC: No numbness although he does complain of being weak. No migraines, stroke, or seizures. PSYCH: The patient denies anxiety, insomnia, or depression.   PHYSICAL EXAMINATION:  GENERAL: The patient is acutely ill-appearing, in moderate distress.   VITAL SIGNS: Vital signs remarkable for a blood pressure of 154/83 with a heart rate of 108 and a respiratory rate of 20. He is afebrile.   HEENT: Normocephalic, atraumatic. Pupils equally round and reactive to light and accommodation. Extraocular movements are intact. Sclerae are anicteric. Conjunctivae are clear.  Oropharynx is dry but clear.   NECK: Supple without jugular venous distention or bruits. No adenopathy or thyromegaly is noted.   LUNGS: Clear to auscultation and percussion without wheezes, rales, or rhonchi. No dullness.   CARDIAC: Rapid rate with a regular rhythm. Normal S1 and S2. No significant rubs, murmurs, or gallops. PMI is nondisplaced. Chest wall is nontender.   ABDOMEN: Soft but diffusely tender. No rebound or guarding. Normoactive bowel sounds. No organomegaly or mass was appreciated. No hernias or bruits were noted.   EXTREMITIES: Without clubbing, cyanosis, or edema. Pulses were 2+ bilaterally.   SKIN: Warm and dry without rash or lesions.   NEUROLOGIC: Cranial nerves II through XII grossly intact. Deep tendon reflexes were symmetric. Motor and sensory examination was nonfocal.   PSYCH: The patient was alert and oriented to person, place, and time. He was cooperative and used good judgment.   LABORATORY, DIAGNOSTIC, AND RADIOLOGICAL  DATA: White count was 18.7 with a hemoglobin of 16. Urinalysis revealed 1+ blood with no bacteria. Glucose was 579 with a BUN of 21 and a creatinine of 1.69 with a sodium of 130 and a potassium of 5.5. Anion gap was 29. GFR  was 54.   ASSESSMENT:  1. Diabetic ketoacidosis.  2. Medical noncompliance.  3. Intractable nausea and vomiting.  4. Acute on chronic back pain.  5. Dehydration with renal insufficiency.  6. Hyponatremia.  7. Hyperkalemia.   PLAN: The patient will be admitted to the Intensive Care Unit on an insulin drip with IV normal saline. We will follow his sugars [name]. 1 hour and adjust the drip as needed. We will resume his b.i.d. insulin schedule once he is able to come off the drip.  We will use morphine for pain and Zofran for nausea. We will check a chest x-ray at this time because of his back pain. We will also begin empiric antibiotics. Follow up routine labs in the morning. Further treatment and evaluation will depend upon the patient's progress.   TOTAL TIME SPENT ON ADMISSION: 50 minutes.   ____________________________ Duane Lope Judithann Sheen, MD jds:bjt D: 02/10/2012 15:52:51 ET T: 02/10/2012 16:27:43 ET JOB#: [id number]  cc: Duane Lope. Judithann Sheen, MD, <Dictator> Marvella Jenning Rodena Medin MD ELECTRONICALLY SIGNED 02/10/2012 19:52

## 2015-02-13 NOTE — H&P (Signed)
PATIENT NAME:  Mark [name], Mark [name] MR#:  [id number] DATE OF BIRTH:  03-27-1986  DATE OF ADMISSION:  02/20/2012  PRIMARY CARE PHYSICIAN: He is supposed to go to the [facility].  ED REFERRING PHYSICIAN: Joseph Art, MD  CHIEF COMPLAINT: Nausea, vomiting, and hyperglycemia.   HISTORY OF PRESENT ILLNESS: The patient is a 29 year old white male with diabetes type 1 who is on insulin and has medical noncompliance, he has been admitted numerous times for DKA, who reports that he could not find his insulin yesterday evening and then throughout the night he started becoming nauseous and throwing up. Therefore, he called EMS. When he arrived here, he was noted to have a blood glucose around 875. The patient reports also that since last night he has been urinating a lot and has been feeling thirsty. He complains of subjective fevers and chills. He denies any chest pain. He reports that he is having some shortness of breath. He denies any abdominal pain or diarrhea. When asked about whether he checks his blood glucose he reports that he has no glucose strips but has a Glucometer.   PAST MEDICAL HISTORY:  1. Chronic back pain.  2. Bipolar disorder. 3. Insulin-dependent diabetes.  4. Hypertension.  5. Hyperlipidemia.  6. History of MRSA.  7. History of nephrolithiasis. 8. History of substance abuse. 9. Medical noncompliance.   ALLERGIES: Augmentin, penicillin, sulfa, Tylenol, and Ultram.   CURRENT MEDICATIONS:  1. Aspirin 81 mg one tab p.o. daily.  2. Insulin 70/30, 50 units in the morning and 35 units in the evening. 3. Tylenol 1 to 2 tabs every four hours p.r.n.   SOCIAL HISTORY: The patient has a history of alcohol and tobacco abuse, but reports he has not been drinking. He continues to smoke.   FAMILY HISTORY: Positive for hypertension and diabetes.   REVIEW OF SYSTEMS: CONSTITUTIONAL: Complains of subjective fever, fatigue, and  weakness. Has chronic back pain. Denies any weight loss or  weight gain. EYES: No blurred or double vision. No redness. No inflammation. No glaucoma. No cataracts. ENT: No tinnitus. No ear pain. No hearing loss. No allergies, seasonal or year-round. No epistaxis. No nasal discharge. No snoring. No postnasal drip. No sinus pain. No redness of the oropharynx. No difficulty swallowing. RESPIRATORY: Denies any cough, wheezing, or hemoptysis. No chronic obstructive pulmonary disease. No tuberculosis. CARDIOVASCULAR: No chest pain. No orthopnea. No edema. No arrhythmia. GASTROINTESTINAL: Complains of nausea and vomiting. No abdominal pain. No hematemesis. No melena. No gastroesophageal reflux disease. No irritable bowel syndrome. GENITOURINARY: Denies any dysuria, hematuria, renal calculus, or frequency. ENDOCRINE: Complains of polyuria and nocturia. Denies any thyroid problems. No increase in sweating, heat or cold intolerance. HEME/LYMPH: Denies anemia, easy bruisability, or bleeding. SKIN: No acne. No rash. No changes in mole, hair or skin. MUSCULOSKELETAL: Has chronic back pain. Denies any erythema or gout. NEUROLOGIC: No numbness. No weakness. No cerebrovascular accident. No transient ischemic attack. No seizures. PSYCHIATRIC: Has history of bipolar disorder. Denies anxiety or insomnia.   PHYSICAL EXAMINATION:   VITAL SIGNS: Temperature 97.8, pulse 94, respirations 14, blood pressure 120/69, and O2 96%.   GENERAL: The patient is a 29 year old unkempt male in no acute distress.   HEENT: Head atraumatic, normocephalic. Pupils are equal, round, and reactive to light and accommodation. Extraocular movements intact. There is no conjunctival pallor. No scleral icterus. Oropharynx is dry. There is no exudate.   NECK: No thyromegaly. No carotid bruits.   CARDIOVASCULAR: Regular rate and rhythm. No murmurs, rubs, clicks,  or gallops. PMI is not displaced.   LUNGS: Clear to auscultation bilaterally without any rales, rhonchi, or wheezing. No accessory muscle usage.    ABDOMEN: Soft, nontender, and nondistended. Positive bowel sounds x4. There is no hepatosplenomegaly.   EXTREMITIES: No clubbing, cyanosis, or edema.   SKIN: No rash.   LYMPHATICS: No lymph nodes palpable.   MUSCULOSKELETAL: There is no swelling or erythema.   VASCULAR: Good DP and PT pulses.   PSYCHIATRIC: Not anxious or depressed.   NEUROLOGICAL: Awake, alert, and oriented x3. No focal deficits.  LYMPH: No lymph nodes palpable.  LABS/STUDIES: In the ED glucose was 875, BUN 32, creatinine 1.14, sodium 137, potassium 4.9, chloride 100, CO2 26, and anion gap 11. LFTs: Total protein 8.6, otherwise LFTs were normal. WBC 7.8, hemoglobin 14.5, and platelet count 317.   Urinalysis showed nitrites negative, leukocytes negative.   ASSESSMENT AND PLAN: The patient is a 29 year old white male with history of medication noncompliance and recurrent admissions to the hospital who presents with nausea and vomiting and noted to have severe hyperglycemia,  1. Severe hyperglycemia with blood sugars in the 800s due to medical noncompliance, dietary indiscretion. At this time, the patient will need insulin drip to decrease his blood glucose due to severity of the elevation. We will start him on insulin drip, give him aggressive IV fluids. Once his blood glucose is less than 250, we will go ahead and restart his 70/30. I will also get a dietary consult. We will get case manager to assist with discharge planning.  2. History of hypertension. Blood pressure is currently normal. He is not on any antihypertensives. We will follow his blood pressure.  3. Medication noncompliance. The patient is encouraged to follow-up with a MD on discharge at the [facility].  4. Bipolar disorder. Currently not on any treatment. The patient currently has no symptoms of mania or depression.  5. History of hyperlipidemia. Not on any treatment. At this time, we will not repeat a fasting lipid panel until his blood sugars  are lower because his triglycerides will be affected by the hyperglycemia falsely.  6. Nicotine addiction. The patient is counseled and recommended to start the nicotine patch as needed.   TIME SPENT: 40 minutes. ____________________________ Lacie ScottsShreyang H. Allena KatzPatel, MD shp:slb D: 02/20/2012 10:00:09 ET T: 02/20/2012 10:12:10 ET JOB#: [id number]  cc: Deliliah Spranger H. Allena KatzPatel, MD, <Dictator> [facility] Charise CarwinSHREYANG H Chase Arnall MD ELECTRONICALLY SIGNED 02/21/2012 8:17

## 2015-02-13 NOTE — Consult Note (Signed)
Brief Consult Note: Diagnosis: multiple buttock abscesses.   Patient was seen by consultant.   Consult note dictated.   Recommend to proceed with surgery or procedure.   Orders entered.   Discussed with Attending MD.   Comments: Pt ate today, will proceed to OR in am for  I&D of mult buttock abscesses.  Electronic Signatures: Lattie Hawooper, Yehonatan Grandison E (MD)  (Signed [phone]-May-13 15:48)  Authored: Brief Consult Note   Last Updated: 24-May-13 15:48 by Lattie Hawooper, Nathan Moctezuma E (MD)

## 2015-02-13 NOTE — Consult Note (Signed)
PATIENT NAME:  Mark [name], [name] MR#:  [id number] DATE OF BIRTH:  June 09, 1986  DATE OF CONSULTATION:  03/14/2012  REFERRING PHYSICIAN:   Alford Highlandichard Wieting, MD   CONSULTING PHYSICIAN:  Adah Salvageichard E. Excell Seltzerooper, MD  CHIEF COMPLAINT: Buttock abscesses.   HISTORY OF PRESENT ILLNESS: The patient is a 29 year old Caucasian male patient with a history of buttock abscesses in the past that have been drained surgically. He presents with approximately one week of worsening buttock pain with low-grade fevers. He has had episodes like this before. He denies nausea, vomiting, or weight loss.   PAST MEDICAL HISTORY: Diabetes.   PAST SURGICAL HISTORY: Drainage of buttock abscesses.   ALLERGIES: Augmentin, penicillin and sulfa.   MEDICATIONS: Insulin and aspirin.   FAMILY HISTORY: Noncontributory.   SOCIAL HISTORY: He is unemployed. He smokes about 1 pack of cigarettes per day. He does not drink much alcohol.   REVIEW OF SYSTEMS: Ten-system review is performed and negative with the exception of that mentioned in the history of present illness. Also of note, the patient ate lunch today.   PHYSICAL EXAMINATION:  GENERAL: The patient smells of smoke.   VITAL SIGNS: Temperature 98.8, pulse 133, respirations 20, blood pressure 121/58, 96% room air saturation. Pain scale of 7, BMI of 28.   HEENT: No scleral icterus. Poor dentition.   NECK: No palpable neck nodes.  CHEST: Clear to auscultation.   CARDIAC: Regular rate and rhythm.   ABDOMEN: Soft, nontender.   EXTREMITIES: Without edema. Calves are nontender.   NEUROLOGICAL: Grossly intact.   INTEGUMENT: The buttock shows multiple scars as well as a large abscess on the left and two smaller abscesses on the right buttock, neither of which are spontaneously draining but are quite indurated and tender and erythematous.   LABORATORY DATA: Laboratory values demonstrate a white blood cell count of 17.7, hemoglobin and hematocrit of 13 and 41, with a platelet  count of 394. Glucose is 385, sodium 132, chloride of 97.   ASSESSMENT AND PLAN: This is a patient with bilateral buttock abscesses. He will require incision and drainage. His electrolytes require correction at this point, and he ate lunch. Therefore, I have planned incision and drainage tomorrow morning. I discussed this with Dr. Renae GlossWieting, who is admitting the patient and who is starting antibiotics on him. The patient was understanding of this plan.   ____________________________ Adah Salvageichard E. Excell Seltzerooper, MD rec:cbb D: 03/14/2012 16:09:23 ET T: 03/15/2012 11:38:21 ET JOB#: [id number]  cc: Adah Salvageichard E. Excell Seltzerooper, MD, <Dictator> Lattie HawICHARD E Riad Wagley MD ELECTRONICALLY SIGNED 03/15/2012 14:04

## 2015-02-13 NOTE — Discharge Summary (Signed)
PATIENT NAME:  Mark [name], Mark [name] MR#:  [id number] DATE OF BIRTH:  October 20, 1986  DATE OF ADMISSION:  04/20/2012 DATE OF DISCHARGE:  04/22/2012  PRIMARY CARE PHYSICIAN: [facility]   REASON FOR ADMISSION: Nausea and vomiting.   DISCHARGE DIAGNOSES:  1. Diabetic ketoacidosis due to medication noncompliance and running out of insulin.  2. Leukocytosis, resolved and felt to be stress induced.  3. History of hypertension. 4. History of dyslipidemia with hypercholesterolemia and hypertriglyceridemia.  5. History of insulin-dependent diabetes mellitus with recent hemoglobin A1c of 10.3 indicating poor control. 6. Pseudohyponatremia due to hyperglycemia.  7. Bilateral lower extremity numbness, pain, and tingling felt to be due to diabetic neuropathy.  8. Tobacco abuse. 9. Recently diagnosed pneumonia which has clinically resolved and also radiographically resolved.  10. Anemia.  11. History of bipolar disorder. 12. History of nephrolithiasis. 13. History of chronic back pain. 14. History of substance abuse.  15. History of medication noncompliance.  16. Previous history of diabetic ketoacidosis requiring hospital admission.   CONSULTANTS: None.   DISCHARGE DISPOSITION: Home.  DISCHARGE MEDICATIONS: 1. Insulin 70/30, 35 units subcutaneously in the a.m. and 50 units subcutaneously every p.m.  2. Aspirin 81 mg p.o. daily.  3. Neurontin 300 mg p.o. twice a day. 4. Tylenol 325 mg 1 to 2 tablets p.o. every 4 to 6 hours p.r.n. pain.   DISCHARGE CONDITION: Improved, stable.   DISCHARGE ACTIVITY: As tolerated.   DISCHARGE DIET: Low sodium, ADA, low fat, low cholesterol, low triglyceride.   DISCHARGE INSTRUCTIONS:  1. Take medications as prescribed.  2. Return to the emergency department for recurrence of symptoms or recurrence of nausea, vomiting, abdominal pain, fever or chills.  FOLLOWUP INSTRUCTIONS: Followup with the [facility] within 1 to 2 weeks. The patient needs repeat  hemoglobin and hematocrit check within one week.   PROCEDURES: Chest x-ray PA and lateral on 04/20/2012: No acute cardiopulmonary abnormalities are noted. There are no infiltrates noted.  PERTINENT LABORATORY DATA: Sodium 130 on admission with serum glucose 684 on admission with serum glucose 76 from 04/21/2012 and sodium 136 on 04/21/2012.  Serum ketones were positive from 04/20/2012 and the patient had an anion gap of 24 at that time with serum bicarbonate of 15 and anion gap 7 with serum bicarbonate 26 from 04/21/2012.   Urine drug screen was negative.  CBC on admission: WBC 13.5, hemoglobin 12.5, hematocrit 39.4, and platelets 457.  CBC from 04/22/2012: WBC 9.7, hemoglobin 10.9, hematocrit 32.8, and platelets 404.  Blood cultures x2 from 04/20/2012: No growth to date.   BRIEF HISTORY/HOSPITAL COURSE: The patient is a 29 year old male with past medical history of insulin-dependent diabetes mellitus and previous hospital admissions for DKA due to medication noncompliance and running out of his insulin with history of dyslipidemia with hypercholesterolemia and hypertriglyceridemia, substance abuse and tobacco abuse, bipolar disorder, nephrolithiasis, chronic back pain, and recently diagnosed pneumonia who presented to the emergency department with complaints of nausea and vomiting. Please see the dictated admission history and physical for pertinent details surrounding the onset of this hospitalization and please see below for further details. 1. DKA - as evidenced by profoundly elevated serum glucose levels with anion gap metabolic acidosis consistent with diabetic ketoacidosis due to medication noncompliance as the patient stated that he had missed and ran out of his insulin over the past week prior to hospital admission. He was admitted to the Critical Care Unit and diabetic ketoacidosis was managed in standard fashion by initially keeping the patient on IV  fluids and IV insulin drip and  thereafter his sugars became much better controlled and his nausea and vomiting had eventually resolved. He was initially maintained n.p.o. except for medications. Once his anion gap had closed, he was transitioned off IV insulin drip back onto subcutaneous insulin. He was started back on his insulin regimen which he was supposed to have been taking at home consisting of Novolin 70/30 and he was strongly counseled and advised to remain compliant and adherent to his prescribed insulin regimen. He was also advised that he will need close outpatient follow up with the [facility] for continuation of medical care and also to renew his prescription for insulin and the remainder of his prescribed medications. After DKA had resolved, he was transferred out of the Intensive Care Unit to the medical floor. His sugars have stabilized.  2. He had leukocytosis and this was felt to be stress induced from DKA and he has remained afebrile and WBC count has normalized at the time of discharge. There was recently diagnosed with pneumonia, but there is no evidence of pneumonia at this time as he had no shortness of breath, cough, fever, or chills and radiographically there were no infiltrates or abnormalities noted on chest x-ray. Urinalysis was not indicative of a urinary tract infection. He was initially started on empiric IV antibiotics in the ER, but infectious cause of leukocytosis is felt to be less likely and thereafter his antibiotics were stopped/discontinued. Blood cultures do not reveal any growth to date.  3. History of hypertension - the patient has been persistently normotensive off medications, noted during last hospitalization as well and also during this hospitalization and he is on any blood pressure medications at home and his blood pressure continues to run normal off of any blood pressure medications and he will need close outpatient follow-up in regards to blood pressure management and monitoring.   4. Dyslipidemia with hypercholesterolemia and hypertriglyceridemia - the patient wishes to continue trial of diet and exercise control before being started on medications and he has been advised to adhere to a low fat, low cholesterol, low triglyceride diet. 5. Insulin-dependent diabetes mellitus - poorly controlled with recent hemoglobin A1c of 10.3, due to medication noncompliance for which the patient was strongly counseled. He has also been started on aspirin for primary prevention of myocardial infarction and CVA given concomitant hyperlipidemia and tobacco abuse.  6. Pseudohyponatremia - this is due to hyperglycemia and serum sodium level is normal at the time of discharge. His corrected sodium level was within normal limits for his level of hyperglycemia.  7. Bilateral lower extremity pain, numbness, tingling - which is intermittent and felt to be due to neuropathy from poorly controlled diabetes mellitus and the patient was started on Neurontin and he showed good response and at the time of discharge he is no longer having pain or numbness or tingling in his legs or feet. Therefore, he has been given a prescription for Neurontin upon hospital discharge.  8. Tobacco abuse - the patient was strongly counseled on the importance of smoking cessation.  9. Anemia - this is a new diagnosis. There may have also been a dilutional component and suspect he may have possible anemia of chronic disease and there were no indications for blood transfusion during this hospitalization as the patient has been asymptomatic and hemodynamically stable and his anemia can be further worked up and evaluated and managed as an outpatient by his primary physician at the [facility]. The patient  was in agreement with this plan. The patient actually preferred having anemia worked up in the outpatient setting rather than in the inpatient setting. There is low suspicion for active bleeding at this time. The patient was advised  by myself to return to the ER immediately if he experiences any weakness, fatigue, lethargy, malaise, shortness of breath, chest pain, presyncope, or feeling faint and he verbally agreed to do so if he experienced any of these symptoms and he was also advised to return to the ER if he experiences any blood in his stool or dark or black stools or any hemoptysis, hematemesis, or any bleeding complications whatsoever to which the patient was agreeable.   On 03/23/2012, the patient was hemodynamically stable and without any pain, nausea or vomiting and he had no evidence of diabetic ketoacidosis and is felt to be stable for discharge home with close outpatient follow-up to which the patient is agreeable.   TIME SPENT ON DISCHARGE: Greater than 30 minutes.  ___________________________ Elon Alas, MD knl:slb D: 04/26/2012 19:16:25 ET     T: 04/28/2012 13:03:33 ET        JOB#: [id number] cc: Elon Alas, MD, <Dictator> [facility] Elon Alas MD ELECTRONICALLY SIGNED 05/02/2012 17:34

## 2015-02-20 NOTE — H&P (Signed)
PATIENT NAME:  Mark [name], Otniel Q MR#:  [id number] DATE OF BIRTH:  February 09, 1986  DATE OF ADMISSION:  01/06/2015  PRIMARY CARE PROVIDER: None.   EMERGENCY DEPARTMENT REFERRING DOCTOR:  Dr. Mindi JunkerGottlieb.  CHIEF COMPLAINT: Nausea, vomiting, abdominal pain, ran out of his insulin.   HISTORY OF PRESENT ILLNESS: The patient is a 29 year old well known to our service with recurrent admissions who has a history of medication noncompliance, and has refused to go to the [facility] who presents with nausea and vomiting. States that he ran out of his insulin a few days ago because his mother has cancer and she bought the chemotherapy and he was not able to buy his insulin. The patient otherwise denies any chest pain, fever, chills. Denies any urinary symptoms.   PAST MEDICAL HISTORY: Diabetes type 1 with recurrent DKA, diagnosis of diabetes at age 29, history of medication noncompliance.  History of tobacco abuse.   SOCIAL HISTORY: Smokes 1 pack per day, denies alcohol or drug use.   FAMILY HISTORY: Positive for diabetes.  ALLERGIES:  AUGMENTIN, CLINDAMYCIN, PENICILLIN AS WELL AS SULFA DRUGS.   HOME MEDICATIONS: He is on insulin 70/30 45 units in the morning and 65 units in the evening.   REVIEW OF SYSTEMS: CONSTITUTIONAL: Denies any fevers, chills. No fatigue, no significant weakness.  EYES: No blurred or double vision. No eye pain. EARS: Denies any tinnitus, ear pain, or hearing loss.  RESPIRATORY: Denies any cough, wheezing, shortness of breath.  CARDIOVASCULAR: Denies any chest pain, palpitations, edema. GASTROINTESTINAL:  Complains of nausea, vomiting, abdominal pain.   GENITOURINARY: Denies any dysuria or hematuria.  ENDOCRINE: Denies any nocturia or polyuria.  HEMATOLOGY AND LYMPHATIC: Denies any easy bruisability or bleeding.  SKIN: Denies any rash or lesion.  MUSCULOSKELETAL: Denies any pain in the neck, back, or shoulder.  NEUROLOGIC: No CVA, TIA, or seizures.  PSYCHIATRIC: No anxiety,  no depression.   PHYSICAL EXAMINATION:  VITAL SIGNS: Temperature 98.4, pulse 94, respirations 15, blood pressure 151/138, O2 of 98%.  GENERAL: The patient is a well-developed male.  HEENT: Head atraumatic, normocephalic. Pupils equally round, reactive to light and accommodation. There is no conjunctival pallor. Nasal exam shows no drainage or ulceration.   Oropharynx is dry.  Eyes:  Pupils equally round, reactive to light and accommodation. There is no conjunctival pallor.  Ear, nose, and throat clear without any exudate.  No external ear lesions. No drainage.  NECK:  Supple, no thyromegaly, no JVD.  CARDIOVASCULAR: Regular rate and rhythm. No murmurs, rubs, clicks, or gallops.  LUNGS: Clear to auscultation bilaterally without any rales, wheezing, or rhonchi.  ABDOMEN: Soft, nontender, nondistended. Positive bowel sounds x 4.  MUSCULOSKELETAL: There is no erythema or swelling.  VASCULAR: Good DP, PT pulses.  PSYCHIATRIC: Not anxious or depressed. NEUROLOGIC:  Awake, alert, oriented x 3.  No focal deficits. LYMPH NODES:  Nonpalpable.   LABORATORY EVALUATIONS:  Glucose 944, BUN 26, creatinine 1.32, sodium 124, potassium 5.0, chloride 90, CO2 is 21, anion gap 13. LFTs are normal except a slightly low ALT.  ASSESSMENT AND PLAN:  1.  The patient is a 29 year old with history of type 1 diabetes, medication noncompliance, who presents with nausea, vomiting, abdominal pain, noted to have very high blood glucose in the 900 range. His anion gap is normal, however, his CO2 is slightly low. He likely has low-grade diabetic ketoacidosis, as well as severe hyperglycemia.  At this time, patient will be on aggressive IV fluids. He will be started on an  insulin drip. We will check his BMP every 6 hours and will start him on an insulin drip. Once his blood sugars are less than 200, we can restart his 70/30 at 65 units in the evening and the insulin drip can be discontinued. We will monitor his potassium, add  potassium to the IV fluids as needed.  2.  Nicotine addiction. The patient given smoking cessation, 4 minutes spent on smoking cessation. Strongly recommend the patient stop smoking. He is not interested in quitting. I offered him nicotine patch.  He is not interested in that.   Time spent on this patient's H and P is 50 minutes of critical care time.    ____________________________ Lacie Scotts Allena Katz, MD shp:sp D: 01/06/2015 15:28:06 ET T: 01/06/2015 16:35:05 ET JOB#: [id number]  cc: Nikeya Maxim H. Allena Katz, MD, <Dictator> Charise Carwin MD ELECTRONICALLY SIGNED 01/21/2015 14:11

## 2015-02-20 NOTE — Discharge Summary (Signed)
PATIENT NAME:  Mark [name], Artur Q MR#:  [id number] DATE OF BIRTH:  [birth year]  DATE OF ADMISSION:  01/06/2015 DATE OF DISCHARGE:  01/07/2015  DISCHARGE DIAGNOSES:  1.  Diabetic ketoacidosis, noncompliance to medication.  2.  Toothache.   CONDITION ON DISCHARGE: Stable.   MEDICATIONS ON DISCHARGE: NovoLog mix 70/30 subcutaneous 45 units subcutaneous once a day at bedtime.   DIET: Advised to have carbohydrate-controlled ADA diet and diet consistency regular.   ACTIVITY: As tolerated.   TIMEFRAME TO FOLLOWUP: Within 1-2 weeks in dentist office and 2-4 weeks with PMD.   HISTORY OF PRESENTING ILLNESS: [name] well known to the service with recurrent admissions who has a history of medication noncompliance and referred to go to [facility]. Says that he ran out of his insulin a few days ago, as his mother has cancer and he has to buy chemotherapy medications for her, so he did not have the money to buy insulin and so ended up in the Emergency Room with his high sugar with complaint of nausea, vomiting, abdominal pain.   HOSPITAL COURSE AND STAY:  1.  The patient was found to have DKA. This was due to noncompliance. It resolved with insulin drip. Care manager helped in arranging insulin for him.  2.  Toothache. The patient has poor dental hygiene. Advised to follow with dentist in the office.   3.  Elevated TSH. It may be due to stress of DKA. Need to check his baseline, in his normal health and advised to follow with PMD in a few days.   IMPORTANT LABORATORY RESULTS IN THE HOSPITAL ADMISSION: Glucose level 944 on admission, BUN 26, creatinine 1.32, sodium 124, potassium 5, chloride 90, CO2 of 21. WBC 12.7, hemoglobin 14.5, platelet 290,000, MCV is 103. Urinalysis is grossly negative. ABG: PH 7.35, pCO2 of 38, and pO2 of 55, and FiO2 21. TSH was 14.704 in the hospital.   TOTAL TIME SPENT ON THIS DISCHARGE: 35 minutes.    ____________________________ Hope PigeonVaibhavkumar G. Elisabeth PigeonVachhani,  MD vgv:bm D: 01/11/2015 17:30:59 ET T: 01/12/2015 02:52:46 ET JOB#: [id number]  cc: Hope PigeonVaibhavkumar G. Elisabeth PigeonVachhani, MD, <Dictator> Altamese DillingVAIBHAVKUMAR Onix Jumper MD ELECTRONICALLY SIGNED 01/27/2015 0:19

## 2015-10-23 ENCOUNTER — Emergency Department: Payer: Self-pay

## 2015-10-23 ENCOUNTER — Inpatient Hospital Stay
Admission: EM | Admit: 2015-10-23 | Discharge: 2015-10-24 | DRG: 638 | Disposition: A | Discharge status: Home / Self Care | Payer: Self-pay | Attending: Internal Medicine | Admitting: Internal Medicine

## 2015-10-23 DIAGNOSIS — E876 Hypokalemia: Secondary | ICD-10-CM | POA: Diagnosis present

## 2015-10-23 DIAGNOSIS — E875 Hyperkalemia: Secondary | ICD-10-CM | POA: Diagnosis present

## 2015-10-23 DIAGNOSIS — E1010 Type 1 diabetes mellitus with ketoacidosis without coma: Principal | ICD-10-CM | POA: Diagnosis present

## 2015-10-23 DIAGNOSIS — N289 Disorder of kidney and ureter, unspecified: Secondary | ICD-10-CM | POA: Diagnosis present

## 2015-10-23 DIAGNOSIS — Z803 Family history of malignant neoplasm of breast: Secondary | ICD-10-CM

## 2015-10-23 DIAGNOSIS — E1110 Type 2 diabetes mellitus with ketoacidosis without coma: Secondary | ICD-10-CM | POA: Diagnosis present

## 2015-10-23 DIAGNOSIS — Z881 Allergy status to other antibiotic agents status: Secondary | ICD-10-CM

## 2015-10-23 DIAGNOSIS — Z794 Long term (current) use of insulin: Secondary | ICD-10-CM

## 2015-10-23 DIAGNOSIS — F17210 Nicotine dependence, cigarettes, uncomplicated: Secondary | ICD-10-CM | POA: Diagnosis present

## 2015-10-23 DIAGNOSIS — Z880 Allergy status to penicillin: Secondary | ICD-10-CM

## 2015-10-23 DIAGNOSIS — E871 Hypo-osmolality and hyponatremia: Secondary | ICD-10-CM | POA: Diagnosis present

## 2015-10-23 DIAGNOSIS — E860 Dehydration: Secondary | ICD-10-CM | POA: Diagnosis present

## 2015-10-23 DIAGNOSIS — Z87442 Personal history of urinary calculi: Secondary | ICD-10-CM

## 2015-10-23 DIAGNOSIS — Z882 Allergy status to sulfonamides status: Secondary | ICD-10-CM

## 2015-10-23 DIAGNOSIS — Z9114 Patient's other noncompliance with medication regimen: Secondary | ICD-10-CM

## 2015-10-23 HISTORY — DX: Hydrocephalus, unspecified: G91.9

## 2015-10-23 HISTORY — DX: Type 2 diabetes mellitus without complications: E11.9

## 2015-10-23 LAB — BASIC METABOLIC PANEL
Anion gap: 8 (ref 5–15)
Anion gap: 8 (ref 5–15)
BUN: 19 mg/dL (ref 6–20)
BUN: 14 mg/dL (ref 6–20)
CALCIUM: 9.4 mg/dL (ref 8.9–10.3)
CHLORIDE: 100 mmol/L — AB (ref 101–111)
CO2: 19 mmol/L — AB (ref 22–32)
CO2: 27 mmol/L (ref 22–32)
CREATININE: 1.31 mg/dL — AB (ref 0.61–1.24)
CREATININE: 0.87 mg/dL (ref 0.61–1.24)
Calcium: 9.3 mg/dL (ref 8.9–10.3)
Chloride: 105 mmol/L (ref 101–111)
GFR calc Af Amer: >60 mL/min (ref >60)
GFR calc non Af Amer: >60 mL/min (ref >60)
GFR calc non Af Amer: >60 mL/min (ref >60)
GLUCOSE: 141 mg/dL — AB (ref 65–99)
Glucose, Bld: 499 mg/dL — ABNORMAL HIGH (ref 65–99)
POTASSIUM: 3.3 mmol/L — AB (ref 3.5–5.1)
Potassium: 3.1 mmol/L — ABNORMAL LOW (ref 3.5–5.1)
SODIUM: 127 mmol/L — AB (ref 135–145)
Sodium: 140 mmol/L (ref 135–145)

## 2015-10-23 LAB — CBC
HCT: 43.6 % (ref 40.0–52.0)
HCT: 39.4 % — ABNORMAL LOW (ref 40.0–52.0)
HEMOGLOBIN: 14.1 g/dL (ref 13.0–18.0)
HEMOGLOBIN: 13.1 g/dL (ref 13.0–18.0)
MCH: 32.9 pg (ref 26.0–34.0)
MCH: 32.3 pg (ref 26.0–34.0)
MCHC: 32.4 g/dL (ref 32.0–36.0)
MCHC: 33.2 g/dL (ref 32.0–36.0)
MCV: 101.5 fL — AB (ref 80.0–100.0)
MCV: 97.3 fL (ref 80.0–100.0)
PLATELETS: 357 10*3/uL (ref 150–440)
Platelets: 342 10*3/uL (ref 150–440)
RBC: 4.29 MIL/uL — ABNORMAL LOW (ref 4.40–5.90)
RBC: 4.05 MIL/uL — AB (ref 4.40–5.90)
RDW: 13 % (ref 11.5–14.5)
RDW: 12.9 % (ref 11.5–14.5)
WBC: 10 10*3/uL (ref 3.8–10.6)
WBC: 13.8 10*3/uL — ABNORMAL HIGH (ref 3.8–10.6)

## 2015-10-23 LAB — COMPREHENSIVE METABOLIC PANEL
ALK PHOS: 138 U/L — AB (ref 38–126)
ALT: 18 U/L (ref 17–63)
AST: 22 U/L (ref 15–41)
Albumin: 5.2 g/dL — ABNORMAL HIGH (ref 3.5–5.0)
Anion gap: 18 — ABNORMAL HIGH (ref 5–15)
BUN: 21 mg/dL — AB (ref 6–20)
CHLORIDE: 88 mmol/L — AB (ref 101–111)
CO2: 20 mmol/L — AB (ref 22–32)
Calcium: 9.3 mg/dL (ref 8.9–10.3)
Creatinine, Ser: 1.5 mg/dL — ABNORMAL HIGH (ref 0.61–1.24)
GFR calc Af Amer: >60 mL/min (ref >60)
GFR calc non Af Amer: >60 mL/min (ref >60)
Glucose, Bld: 1140 mg/dL (ref 65–99)
Potassium: 6 mmol/L — ABNORMAL HIGH (ref 3.5–5.1)
SODIUM: 126 mmol/L — AB (ref 135–145)
Total Bilirubin: 1.9 mg/dL — ABNORMAL HIGH (ref 0.3–1.2)
Total Protein: 8.3 g/dL — ABNORMAL HIGH (ref 6.5–8.1)

## 2015-10-23 LAB — CREATININE, SERUM
CREATININE: 1.29 mg/dL — AB (ref 0.61–1.24)
GFR calc Af Amer: >60 mL/min (ref >60)
GFR calc non Af Amer: >60 mL/min (ref >60)

## 2015-10-23 LAB — GLUCOSE, CAPILLARY
GLUCOSE-CAPILLARY: 274 mg/dL — AB (ref 65–99)
GLUCOSE-CAPILLARY: 44 mg/dL — AB (ref 65–99)
GLUCOSE-CAPILLARY: 75 mg/dL (ref 65–99)
GLUCOSE-CAPILLARY: 76 mg/dL (ref 65–99)
Glucose-Capillary: 415 mg/dL — ABNORMAL HIGH (ref 65–99)
Glucose-Capillary: 252 mg/dL — ABNORMAL HIGH (ref 65–99)
Glucose-Capillary: 210 mg/dL — ABNORMAL HIGH (ref 65–99)
Glucose-Capillary: 152 mg/dL — ABNORMAL HIGH (ref 65–99)
Glucose-Capillary: 110 mg/dL — ABNORMAL HIGH (ref 65–99)
Glucose-Capillary: 168 mg/dL — ABNORMAL HIGH (ref 65–99)
Glucose-Capillary: 88 mg/dL (ref 65–99)
Glucose-Capillary: 63 mg/dL — ABNORMAL LOW (ref 65–99)

## 2015-10-23 LAB — MRSA PCR SCREENING: MRSA BY PCR: NEGATIVE

## 2015-10-23 LAB — URINALYSIS COMPLETE WITH MICROSCOPIC (ARMC ONLY)
BACTERIA UA: NONE SEEN
Bilirubin Urine: NEGATIVE
Glucose, UA: >500 mg/dL — AB
Hgb urine dipstick: NEGATIVE
Leukocytes, UA: NEGATIVE
Nitrite: NEGATIVE
PH: 6 (ref 5.0–8.0)
Protein, ur: NEGATIVE mg/dL
SQUAMOUS EPITHELIAL / LPF: NONE SEEN
Specific Gravity, Urine: 1.024 (ref 1.005–1.030)

## 2015-10-23 LAB — LIPASE, BLOOD: LIPASE: 17 U/L (ref 11–51)

## 2015-10-23 MED ORDER — ONDANSETRON HCL 4 MG/2ML IJ SOLN: 4.0000 mg | Freq: Four times a day (QID) | INTRAMUSCULAR | Status: DC | PRN

## 2015-10-23 MED ORDER — NICOTINE 21 MG/24HR TD PT24
21.0000 mg | MEDICATED_PATCH | Freq: Every day | TRANSDERMAL | Status: DC
  Administered 2015-10-23 – 2015-10-24 (×2): 21 mg via TRANSDERMAL
  Filled 2015-10-23 (×2): qty 1

## 2015-10-23 MED ORDER — ONDANSETRON HCL 4 MG/2ML IJ SOLN
4.0000 mg | Freq: Once | INTRAMUSCULAR | Status: AC
  Administered 2015-10-23: 4 mg via INTRAVENOUS
  Filled 2015-10-23: qty 2

## 2015-10-23 MED ORDER — FAMOTIDINE IN NACL 20-0.9 MG/50ML-% IV SOLN
20.0000 mg | INTRAVENOUS | Status: DC
  Administered 2015-10-23 – 2015-10-24 (×2): 20 mg via INTRAVENOUS
  Filled 2015-10-23 (×3): qty 50

## 2015-10-23 MED ORDER — SODIUM CHLORIDE 0.9 % IJ SOLN
3.0000 mL | Freq: Two times a day (BID) | INTRAMUSCULAR | Status: DC
  Administered 2015-10-24: 3 mL via INTRAVENOUS

## 2015-10-23 MED ORDER — ACETAMINOPHEN 650 MG RE SUPP: 650.0000 mg | Freq: Four times a day (QID) | RECTAL | Status: DC | PRN

## 2015-10-23 MED ORDER — SODIUM CHLORIDE 0.9 % IV BOLUS (SEPSIS)
1000.0000 mL | Freq: Once | INTRAVENOUS | Status: AC
  Administered 2015-10-23: 1000 mL via INTRAVENOUS

## 2015-10-23 MED ORDER — DEXTROSE-NACL 5-0.45 % IV SOLN
INTRAVENOUS | Status: DC
  Administered 2015-10-23: 16:00:00 via INTRAVENOUS

## 2015-10-23 MED ORDER — ENOXAPARIN SODIUM 40 MG/0.4ML ~~LOC~~ SOLN
40.0000 mg | SUBCUTANEOUS | Status: DC
  Administered 2015-10-23 – 2015-10-24 (×2): 40 mg via SUBCUTANEOUS
  Filled 2015-10-23 (×3): qty 0.4

## 2015-10-23 MED ORDER — INSULIN ASPART 100 UNIT/ML ~~LOC~~ SOLN: 0.0000 [IU] | Freq: Every day | SUBCUTANEOUS | Status: DC

## 2015-10-23 MED ORDER — INSULIN ASPART PROT & ASPART (70-30 MIX) 100 UNIT/ML ~~LOC~~ SUSP
45.0000 [IU] | Freq: Every day | SUBCUTANEOUS | Status: DC
  Filled 2015-10-23: qty 45

## 2015-10-23 MED ORDER — DEXTROSE-NACL 5-0.45 % IV SOLN: INTRAVENOUS | Status: DC

## 2015-10-23 MED ORDER — IPRATROPIUM-ALBUTEROL 0.5-2.5 (3) MG/3ML IN SOLN: 3.0000 mL | Freq: Four times a day (QID) | RESPIRATORY_TRACT | Status: DC | PRN

## 2015-10-23 MED ORDER — POTASSIUM CHLORIDE IN NACL 20-0.9 MEQ/L-% IV SOLN
INTRAVENOUS | Status: DC
  Administered 2015-10-23: 50 mL via INTRAVENOUS
  Filled 2015-10-23 (×2): qty 1000

## 2015-10-23 MED ORDER — INSULIN ASPART 100 UNIT/ML ~~LOC~~ SOLN
SUBCUTANEOUS | Status: AC
  Administered 2015-10-23: 20 [IU] via INTRAVENOUS
  Filled 2015-10-23: qty 20

## 2015-10-23 MED ORDER — ACETAMINOPHEN 325 MG PO TABS: 650.0000 mg | ORAL_TABLET | Freq: Four times a day (QID) | ORAL | Status: DC | PRN

## 2015-10-23 MED ORDER — ONDANSETRON HCL 4 MG PO TABS: 4.0000 mg | ORAL_TABLET | Freq: Four times a day (QID) | ORAL | Status: DC | PRN

## 2015-10-23 MED ORDER — INSULIN ASPART PROT & ASPART (70-30 MIX) 100 UNIT/ML ~~LOC~~ SUSP: 65.0000 [IU] | Freq: Once | SUBCUTANEOUS | Status: DC

## 2015-10-23 MED ORDER — SODIUM CHLORIDE 0.9 % IV SOLN
1000.0000 mL | Freq: Once | INTRAVENOUS | Status: AC
  Administered 2015-10-23: 1000 mL via INTRAVENOUS

## 2015-10-23 MED ORDER — INSULIN ASPART 100 UNIT/ML ~~LOC~~ SOLN
0.0000 [IU] | Freq: Three times a day (TID) | SUBCUTANEOUS | Status: DC
  Administered 2015-10-24: 2 [IU] via SUBCUTANEOUS
  Filled 2015-10-23: qty 2

## 2015-10-23 MED ORDER — SODIUM CHLORIDE 0.9 % IV SOLN
INTRAVENOUS | Status: DC
  Administered 2015-10-23: 14:00:00 via INTRAVENOUS

## 2015-10-23 MED ORDER — INSULIN ASPART 100 UNIT/ML ~~LOC~~ SOLN
20.0000 [IU] | Freq: Once | SUBCUTANEOUS | Status: AC
  Administered 2015-10-23: 20 [IU] via INTRAVENOUS
  Filled 2015-10-23: qty 20

## 2015-10-23 MED ORDER — INSULIN ASPART PROT & ASPART (70-30 MIX) 100 UNIT/ML ~~LOC~~ SUSP
65.0000 [IU] | Freq: Every day | SUBCUTANEOUS | Status: DC
  Administered 2015-10-23: 65 [IU] via SUBCUTANEOUS
  Filled 2015-10-23: qty 65

## 2015-10-23 MED ORDER — DIPHENHYDRAMINE HCL 25 MG PO CAPS
50.0000 mg | ORAL_CAPSULE | Freq: Every evening | ORAL | Status: DC | PRN
  Administered 2015-10-24: 50 mg via ORAL
  Filled 2015-10-23: qty 2

## 2015-10-23 MED ORDER — SODIUM CHLORIDE 0.9 % IV SOLN
INTRAVENOUS | Status: DC
  Administered 2015-10-23: 5.8 [IU]/h via INTRAVENOUS
  Administered 2015-10-23: 7.1 [IU]/h via INTRAVENOUS
  Administered 2015-10-23: 6 [IU]/h via INTRAVENOUS
  Administered 2015-10-23: 1.5 [IU]/h via INTRAVENOUS
  Administered 2015-10-23: 3.7 [IU]/h via INTRAVENOUS
  Administered 2015-10-23: 5.4 [IU]/h via INTRAVENOUS
  Administered 2015-10-23: 4.3 [IU]/h via INTRAVENOUS
  Administered 2015-10-23: 10.4 [IU]/h via INTRAVENOUS
  Filled 2015-10-23: qty 2.5

## 2015-10-23 NOTE — Plan of Care (Signed)
Called lab needing metb to review potassium level

## 2015-10-23 NOTE — ED Notes (Signed)
Glucose level 1,140. MD aware.

## 2015-10-23 NOTE — Clinical Social Work Note (Signed)
Clinical Social Work Assessment  Patient Details  Name: Derek JackMichael Q Maddalena MRN: [id number] Date of Birth: 05/11/1986  Date of referral:  10/23/15               Reason for consult:  Medication Concerns                Permission sought to share information with:    Permission granted to share information::     Name::        Agency::     Relationship::     Contact Information:     Housing/Transportation Living arrangements for the past 2 months:  Single Family Home Source of Information:  Patient Patient Interpreter Needed:  None Criminal Activity/Legal Involvement Pertinent to Current Situation/Hospitalization:    Significant Relationships:  Parents Lives with:  Parents Do you feel safe going back to the place where you live?  Yes Need for family participation in patient care:  No (Coment)  Care giving concerns:  None at this time   Office managerocial Worker assessment / plan:  CSW consult due to patient not able to get meds. CSW in to assess patient for medical needs and concerns.  Patient was pleasant and engaged in conversation with CSW.  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  Patient last worked 2010, states he is unable to stand up.  Patient has applied for disability but was denied.  Only house hold income is his mother's SSA of $780 per month.   Patients states [facility]. This is a struggle with their limited income.     Per patient he ran out of insulin as he had to double up due to wanting to enjoy some of the holiday food that he should not have eaten.  Patient states he does not have a PCP, states he has no money to get to the appointments as he cannot afford to take a taxi.    CSW discussed the importance of having a PCP.  Patient is open to receiving information and resources.  CSW provided patient with a community resources guide, list of medical clinic with medication assistance and an  application for [facility].   Employment status:  Disabled (Comment on whether or not currently receiving Disability), Unemployed Insurance information:  Self Pay (Medicaid Pending) PT Recommendations:  Not assessed at this time Information / Referral to community resources:  Other (Comment Required) (community resources, medical clinics list for PCP, & application for [facility])  Patient/Family's Response to care:  Patient was appreciative of information provided by CSW and will follow up.   Patient/Family's Understanding of and Emotional Response to Diagnosis, Current Treatment, and Prognosis:  Patient understands that he is under continued medical work up at this time.  Once medically stable he will discharge home.  Emotional Assessment Appearance:  Appears stated age Attitude/Demeanor/Rapport:    Affect (typically observed):  Accepting, Adaptable, Pleasant, Calm Orientation:  Oriented to Self, Oriented to Place, Oriented to  Time, Oriented to Situation Alcohol / Substance use:  Tobacco Use Psych involvement (Current and /or in the community):  No (Comment)  Discharge Needs  Concerns to be addressed:  Lack of Support, Compliance Issues Concerns, Medication Concerns Readmission within the last 30 days:  No Current discharge risk:  Chronically ill, Lack of support system Barriers to Discharge:  Inadequate or no insurance, Continued Medical Work up   [employer], [employer] RxDeborah H,  LCSW 10/23/2015, 4:06 PM

## 2015-10-23 NOTE — Progress Notes (Signed)
Spoke to Dr. Clint GuyHower that patient wants benadryl for sleep. MD will put order in.

## 2015-10-23 NOTE — Progress Notes (Signed)
FSBS 44, 1 40z can regular soda given and will recheck in 15 minutes.

## 2015-10-23 NOTE — Progress Notes (Signed)
FSBS up to 76. Will continue to monitor. On q 1 hour accuchecks until midnight.

## 2015-10-23 NOTE — ED Notes (Signed)
Pt came via EMS from home c/o high blood sugar. Pt is insulin dependent diabetic and has not had insulin since Christmas Eve due to cost. Pt presents with vomiting, increased thirst, and increased urine output. Pt sts vomiting started 2 days ago.

## 2015-10-23 NOTE — ED Provider Notes (Signed)
[facility] Emergency Department Provider Note  ____________________________________________  Time seen: On arrival, via EMS  I have reviewed the triage vital signs and the nursing notes.   HISTORY  Chief Complaint Hyperglycemia    HPI Mark [name] is a 30 y.o. male who presents with complaints of nausea and vomiting and feels that his sugar is high. He has a history of multiple admissions for diabetic ketoacidosis. He has not had his insulin since Christmas Eve apparently because of financial reasons. He denies fevers chills. He has had a mild cough. He denies dysuria. No abdominal pain.     Past Medical History  Diagnosis Date  . Diabetes mellitus without complication [facility])     Patient Active Problem List   Diagnosis Date Noted  . Diabetic keto-acidosis (HCC) 10/23/2015    Past Surgical History  Procedure Laterality Date  . Kidney stone surgery      Current Outpatient Rx  Name  Route  Sig  Dispense  Refill  . insulin NPH-regular Human (NOVOLIN 70/30) (70-30) 100 UNIT/ML injection   Subcutaneous   Inject 45-65 Units into the skin See admin instructions. Inject 45 units subcutaneous every morning and inject 65 units subcutaneous every night at bedtime.           Allergies Penicillins; Sulfa antibiotics; and Clindamycin/lincomycin  No family history on file.  Social History Social History  Substance Use Topics  . Smoking status: Current Every Day Smoker    Types: Cigarettes  . Smokeless tobacco: None  . Alcohol Use: Yes     Comment: ocasional     Review of Systems  Constitutional: Negative for fever. Eyes: Negative for visual changes. ENT: Negative for sore throat Cardiovascular: Negative for chest pain. Respiratory: Negative for shortness of breath. For cough Gastrointestinal: Nausea for nausea Genitourinary: Negative for dysuria. Musculoskeletal: Negative for back pain. Skin: Negative for rash. Neurological: Negative  for headaches or focal weakness Psychiatric: No anxiety    ____________________________________________   PHYSICAL EXAM:  VITAL SIGNS: BP 109/70 mmHg  Pulse 98  Temp(Src) 98.9 F (37.2 C) (Oral)  Resp 14  Ht 6\' 2"  (1.88 m)  Wt 104.327 kg  BMI 29.52 kg/m2  SpO2 95%   ED Triage Vitals  Enc Vitals Group     BP --      Pulse --      Resp --      Temp --      Temp src --      SpO2 --      Weight --      Height --      Head Cir --      Peak Flow --      Pain Score --      Pain Loc --      Pain Edu? --      Excl. in GC? --    Constitutional: Alert and oriented. Disheveled but no acute distress Eyes: Conjunctivae are normal.  ENT   Head: Normocephalic and atraumatic.   Mouth/Throat: Mucous membranes are moist. Cardiovascular: Tachycardic, regular rhythm. Normal and symmetric distal pulses are present in all extremities. No murmurs, rubs, or gallops. Respiratory: Normal respiratory effort without tachypnea nor retractions. Breath sounds are clear and equal bilaterally.  Gastrointestinal: Soft and non-tender in all quadrants. No distention. There is no CVA tenderness. Genitourinary: deferred Musculoskeletal: Nontender with normal range of motion in all extremities. No lower extremity tenderness nor edema. Neurologic:  Normal speech and language. No gross focal  neurologic deficits are appreciated. Skin:  Skin is warm, dry and intact. No rash noted. Psychiatric: Mood and affect are normal. Patient exhibits appropriate insight and judgment.  ____________________________________________    LABS (pertinent positives/negatives)  Labs Reviewed  CBC - Abnormal; Notable for the following:    RBC 4.29 (*)    MCV 101.5 (*)    All other components within normal limits  COMPREHENSIVE METABOLIC PANEL - Abnormal; Notable for the following:    Sodium 126 (*)    Potassium 6.0 (*)    Chloride 88 (*)    CO2 20 (*)    Glucose, Bld 1140 (*)    BUN 21 (*)    Creatinine,  Ser 1.50 (*)    Total Protein 8.3 (*)    Albumin 5.2 (*)    Alkaline Phosphatase 138 (*)    Total Bilirubin 1.9 (*)    Anion gap 18 (*)    All other components within normal limits  BLOOD GAS, VENOUS - Abnormal; Notable for the following:    pCO2, Ven 38 (*)    Bicarbonate 20.5 (*)    Acid-base deficit 4.8 (*)    All other components within normal limits  URINALYSIS COMPLETEWITH MICROSCOPIC ([facility] ONLY) - Abnormal; Notable for the following:    Color, Urine COLORLESS (*)    APPearance CLEAR (*)    Glucose, UA >500 (*)    Ketones, ur 1+ (*)    All other components within normal limits  CBC  CREATININE, SERUM  CBG MONITORING, ED    ____________________________________________   EKG  ED ECG REPORT I, Jene Every, the attending physician, personally viewed and interpreted this ECG.  Date: 10/23/2015 EKG Time: 8:26 AM Rate: 98 Rhythm: normal sinus rhythm QRS Axis: normal Intervals: normal ST/T Wave abnormalities: normal Conduction Disutrbances: none Narrative Interpretation: unremarkable   ____________________________________________    RADIOLOGY I have personally reviewed any xrays that were ordered on this patient: Chest x-ray unremarkable  ____________________________________________   PROCEDURES  Procedure(s) performed: none  Critical Care performed: yes  CRITICAL CARE Performed by: Jene Every   Total critical care time: 35 minutes  Critical care time was exclusive of separately billable procedures and treating other patients.  Critical care was necessary to treat or prevent imminent or life-threatening deterioration.  Critical care was time spent personally by me on the following activities: development of treatment plan with patient and/or surrogate as well as nursing, discussions with consultants, evaluation of patient's response to treatment, examination of patient, obtaining history from patient or surrogate, ordering and performing  treatments and interventions, ordering and review of laboratory studies, ordering and review of radiographic studies, pulse oximetry and re-evaluation of patient's condition.  ____________________________________________   INITIAL IMPRESSION / ASSESSMENT AND PLAN / ED COURSE  Pertinent labs & imaging results that were available during my care of the patient were reviewed by me and considered in my medical decision making (see chart for details).  Patient presents with nausea vomiting and elevated blood glucose. He reports he feels like he is in DKA. He should know because he has had multiple admissions for this in the past. He denies fevers or chills. No abdominal pain. No dysuria. Mild cough. We will start normal saline IV bolus, given Zofran IV check labs and ABG and chest x-ray urine and reevaluate  Patient's glucose is over 1000. He is acidotic with an elevated anion gap, most consistent with DKA. We will give another liter of fluid and then start insulin drip.  I will  Admit the patient to the medicine service ____________________________________________   FINAL CLINICAL IMPRESSION(S) / ED DIAGNOSES  Final diagnoses:  Diabetic ketoacidosis without coma associated with type 1 diabetes mellitus (HCC)     Jene Everyobert Gerhardt Gleed, MD 10/23/15 1014

## 2015-10-23 NOTE — H&P (Signed)
[facility] [place] [facility]   PATIENT NAME: Mark [name]    MR#:  [id number]  DATE OF BIRTH:  08/28/1986  DATE OF ADMISSION:  10/23/2015  PRIMARY CARE PHYSICIAN: No PCP Per Patient   REQUESTING/REFERRING PHYSICIAN: Jene Everyobert Kinner  CHIEF COMPLAINT:   Chief Complaint  Patient presents with  . Hyperglycemia    HISTORY OF PRESENT ILLNESS:  Mark [name]  is a 30 y.o. male with a known history of diabetes and noncompliance with insulin regimen. He states that he had required more insulin over the holidays and he ran out. [facility] but could not afford [facility]. He has not taken his insulin since Christmas Eve. He's been vomiting constantly and unable to keep anything down. He's had increased urination and blurry vision and very fatigued. In the ER, he was found to be in diabetic ketoacidosis with a glucose greater than thousand and an anion gap.  PAST MEDICAL HISTORY:   Past Medical History  Diagnosis Date  . Diabetes mellitus without complication (HCC)   . Hydrocephalus     PAST SURGICAL HISTORY:   Past Surgical History  Procedure Laterality Date  . Kidney stone surgery      SOCIAL HISTORY:   Social History  Substance Use Topics  . Smoking status: Current Every Day Smoker -- 1.00 packs/day    Types: Cigarettes  . Smokeless tobacco: Not on file  . Alcohol Use: No     Comment: ocasional     FAMILY HISTORY:   Family History  Problem Relation Age of Onset  . Breast cancer Mother     DRUG ALLERGIES:   Allergies  Allergen Reactions  . Penicillins Anaphylaxis, Hives and Swelling    Has patient had a PCN reaction causing immediate rash, facial/tongue/throat swelling, SOB or lightheadedness with hypotension: Yes Has patient had a PCN reaction causing severe rash involving mucus membranes or skin necrosis: No Has patient had a PCN reaction that required hospitalization Yes Has patient had a PCN reaction  occurring within the last 10 years: Yes If all of the above answers are "NO", then may proceed with Cephalosporin use.  . Sulfa Antibiotics Anaphylaxis and Hives  . Clindamycin/Lincomycin Hives    REVIEW OF SYSTEMS:  CONSTITUTIONAL: No fever. Positive for fatigue. Positive for weight loss EYES: Positive for blurry vision  EARS, NOSE, AND THROAT: No tinnitus or ear pain. No sore throat RESPIRATORY: Positive for cough and shortness of breath, no wheezing or hemoptysis.  CARDIOVASCULAR: No chest pain, orthopnea, edema.  GASTROINTESTINAL: Positive for nausea, vomiting, and abdominal pain. No blood in bowel movements. No diarrhea or constipation GENITOURINARY: No dysuria, hematuria.  ENDOCRINE: No polyuria, nocturia,  HEMATOLOGY: No anemia, easy bruising or bleeding SKIN: No rash or lesion. MUSCULOSKELETAL: No joint pain or arthritis.   NEUROLOGIC: No tingling, numbness, weakness.  PSYCHIATRY: Positive for depression.   MEDICATIONS AT HOME:   Prior to Admission medications   Medication Sig Start Date End Date Taking? Authorizing Provider  insulin NPH-regular Human (NOVOLIN 70/30) (70-30) 100 UNIT/ML injection Inject 45-65 Units into the skin See admin instructions. Inject 45 units subcutaneous every morning and inject 65 units subcutaneous every night at bedtime. 09/19/13  Yes Historical Provider, MD      VITAL SIGNS:  Blood pressure 109/70, pulse 98, temperature 98.9 F (37.2 C), temperature source Oral, resp. rate 14, height 6\' 2"  (1.88 m), weight 104.327 kg (230 lb), SpO2 95 %.  PHYSICAL EXAMINATION:  GENERAL:  30 y.o.-year-old patient lying in the bed with no acute distress.  EYES: Pupils equal, round, reactive to light and accommodation. No scleral icterus. Extraocular muscles intact.  HEENT: Head atraumatic, normocephalic. Oropharynx and nasopharynx clear.  NECK:  Supple, no jugular venous distention. No thyroid enlargement, no tenderness.  LUNGS: Decreased breath sounds  bilaterally, no wheezing or rales, scattered rhonchi. No use of accessory muscles of respiration.  CARDIOVASCULAR: S1, S2 normal. No murmurs, rubs, or gallops.  ABDOMEN: Soft, slight tenderness epigastric area, nondistended. Bowel sounds present. No organomegaly or mass.  EXTREMITIES: No pedal edema, cyanosis, or clubbing.  NEUROLOGIC: Cranial nerves II through XII are intact. Muscle strength 5/5 in all extremities. Sensation intact. Gait not checked.  PSYCHIATRIC: The patient is alert and oriented x 3.  SKIN: No rash, lesion, or ulcer.   LABORATORY PANEL:   CBC  Recent Labs Lab 10/23/15 0829  WBC 10.0  HGB 14.1  HCT 43.6  PLT 357   ------------------------------------------------------------------------------------------------------------------  Chemistries   Recent Labs Lab 10/23/15 0829  NA 126*  K 6.0*  CL 88*  CO2 20*  GLUCOSE 1140*  BUN 21*  CREATININE 1.50*  CALCIUM 9.3  AST 22  ALT 18  ALKPHOS 138*  BILITOT 1.9*   ------------------------------------------------------------------------------------------------------------------   RADIOLOGY:  Dg Chest Portable 1 View  10/23/2015  CLINICAL DATA:  Weakness. EXAM: PORTABLE CHEST 1 VIEW COMPARISON:  July 15, 2014. FINDINGS: The heart size and mediastinal contours are within normal limits. Both lungs are clear. No pneumothorax or pleural effusion is noted. The visualized skeletal structures are unremarkable. IMPRESSION: No acute cardiopulmonary abnormality seen. Electronically Signed   By: Lupita Raider, M.D.   On: 10/23/2015 09:00    EKG:   Normal sinus rhythm 98 bpm, early repolarization   IMPRESSION AND PLAN:   1. Diabetic ketoacidosis secondary to noncompliance with insulin regimen. He is well known to our service in the past. Admit to CCU stepdown and keep nothing by mouth. Give 20 units of insulin IV stat and start insulin drip and follow hyperglycemia protocol. On conversion will have to do his  70/30 insulin 45 units in the morning and 65 units in the p.m. 2. Hyperkalemia- this should improve with IV fluid hydration and insulin drip. Serial BMPs. 3. Hyponatremia- this is secondary to diabetic ketoacidosis and should improve with hydration and controlling sugars 4. History of hydrocephalus follows at [facility]surgery. I have not seen a appointment in a while in the care everywhere section of Epic 5. Tobacco abuse smoking cessation counseling done 3 minutes by me and nicotine patch applied. 6. Renal insufficiency and dehydration. Hydrate. 7. Increased total bilirubin likely secondary from nausea vomiting and not eating. 8. Intractable nausea vomiting and abdominal pain- supportive care. I will also check a lipase.  All the records are reviewed and case discussed with ED provider. Management plans discussed with the patient, and he is  in agreement.  CODE STATUS: full code  TOTAL TIME TAKING CARE OF THIS PATIENT: 50 minutes, admission to the CCU stepdown on this critically ill patient.Renae Gloss, Duke Salvia.D on 10/23/2015 at 11:01 AM  Between 7am to 6pm - Pager - [phone]  After 6pm call admission pager [phone]  [facility]  Office  [phone]  CC: Primary care physician; No PCP Per Patient

## 2015-10-24 LAB — CBC
HEMATOCRIT: 36.3 % — AB (ref 40.0–52.0)
HEMOGLOBIN: 12.7 g/dL — AB (ref 13.0–18.0)
MCH: 33.8 pg (ref 26.0–34.0)
MCHC: 34.9 g/dL (ref 32.0–36.0)
MCV: 97 fL (ref 80.0–100.0)
Platelets: 291 10*3/uL (ref 150–440)
RBC: 3.74 MIL/uL — ABNORMAL LOW (ref 4.40–5.90)
RDW: 12.5 % (ref 11.5–14.5)
WBC: 11 10*3/uL — AB (ref 3.8–10.6)

## 2015-10-24 LAB — BASIC METABOLIC PANEL
ANION GAP: 7 (ref 5–15)
BUN: 9 mg/dL (ref 6–20)
CHLORIDE: 102 mmol/L (ref 101–111)
CO2: 26 mmol/L (ref 22–32)
Calcium: 8.8 mg/dL — ABNORMAL LOW (ref 8.9–10.3)
Creatinine, Ser: 0.71 mg/dL (ref 0.61–1.24)
GFR calc Af Amer: >60 mL/min (ref >60)
Glucose, Bld: 94 mg/dL (ref 65–99)
POTASSIUM: 2.9 mmol/L — AB (ref 3.5–5.1)
SODIUM: 135 mmol/L (ref 135–145)

## 2015-10-24 LAB — GLUCOSE, CAPILLARY
GLUCOSE-CAPILLARY: 145 mg/dL — AB (ref 65–99)
GLUCOSE-CAPILLARY: 409 mg/dL — AB (ref 65–99)
GLUCOSE-CAPILLARY: 99 mg/dL (ref 65–99)
Glucose-Capillary: 585 mg/dL (ref 65–99)
Glucose-Capillary: >600 mg/dL (ref 65–99)
Glucose-Capillary: 157 mg/dL — ABNORMAL HIGH (ref 65–99)

## 2015-10-24 LAB — MAGNESIUM: Magnesium: 1.6 mg/dL — ABNORMAL LOW (ref 1.7–2.4)

## 2015-10-24 LAB — POTASSIUM: Potassium: 3.9 mmol/L (ref 3.5–5.1)

## 2015-10-24 LAB — GLUCOSE, RANDOM: Glucose, Bld: 443 mg/dL — ABNORMAL HIGH (ref 65–99)

## 2015-10-24 MED ORDER — INSULIN ASPART PROT & ASPART (70-30 MIX) 100 UNIT/ML ~~LOC~~ SUSP: 35.0000 [IU] | Freq: Two times a day (BID) | SUBCUTANEOUS | Status: DC

## 2015-10-24 MED ORDER — KCL IN DEXTROSE-NACL 20-5-0.45 MEQ/L-%-% IV SOLN
INTRAVENOUS | Status: DC
  Administered 2015-10-24: 02:00:00 via INTRAVENOUS
  Filled 2015-10-24 (×4): qty 1000

## 2015-10-24 MED ORDER — MAGNESIUM SULFATE 2 GM/50ML IV SOLN
2.0000 g | Freq: Once | INTRAVENOUS | Status: AC
  Administered 2015-10-24: 2 g via INTRAVENOUS
  Filled 2015-10-24: qty 50

## 2015-10-24 MED ORDER — NICOTINE 21 MG/24HR TD PT24: 21.0000 mg | MEDICATED_PATCH | Freq: Every day | TRANSDERMAL | Status: DC

## 2015-10-24 MED ORDER — POTASSIUM CHLORIDE CRYS ER 20 MEQ PO TBCR
40.0000 meq | EXTENDED_RELEASE_TABLET | ORAL | Status: AC
  Administered 2015-10-24 (×2): 40 meq via ORAL
  Filled 2015-10-24 (×2): qty 2

## 2015-10-24 MED ORDER — INSULIN NPH ISOPHANE & REGULAR (70-30) 100 UNIT/ML ~~LOC~~ SUSP: 45.0000 [IU] | SUBCUTANEOUS | Status: DC

## 2015-10-24 MED ORDER — INSULIN ASPART 100 UNIT/ML ~~LOC~~ SOLN
15.0000 [IU] | Freq: Once | SUBCUTANEOUS | Status: AC
  Administered 2015-10-24: 15 [IU] via SUBCUTANEOUS
  Filled 2015-10-24: qty 15

## 2015-10-24 NOTE — Progress Notes (Signed)
MD order to discharge.  Per Dr Juliene PinaMody okay to discharge after one time insulin given.  Reviewed discharge instructions with pt.  Pt  Verbalized understanding

## 2015-10-24 NOTE — Progress Notes (Addendum)
Inpatient Diabetes Program Recommendations  AACE/ADA: New Consensus Statement on Inpatient Glycemic Control (2015)  Target Ranges:  Prepandial:   less than 140 mg/dL      Peak postprandial:   less than 180 mg/dL (1-2 hours)      Critically ill patients:  140 - 180 mg/dL  Results for Mark [name], Mark [name] (MRN [id number]) as of 10/24/2015 10:19  Ref. Range 10/23/2015 21:14 10/23/2015 21:38 10/23/2015 22:26 10/23/2015 23:22 10/24/2015 00:06 10/24/2015 04:31 10/24/2015 07:35  Glucose-Capillary Latest Ref Range: 65-99 mg/dL 44 (LL) 76 75 63 (L) 99 145 (H) 157 (H)  Results for Mark [name], Mark [name] (MRN [id number]) as of 10/24/2015 10:19  Ref. Range 10/23/2015 08:29  Glucose Latest Ref Range: 65-99 mg/dL [id number] (HH)   Review of Glycemic Control  Outpatient Diabetes medications: 70/30 45 units QAM, 70/30 65 units QPM Current orders for Inpatient glycemic control: 70/30 45 units QAM, 70/30 65 units QPM, Novolog 0-9 units TID with meals, Novolog 0-5 units HS  Inpatient Diabetes Program Recommendations: Insulin - Basal: Patient received 70/30 65 units at 18:26 on 10/23/15 at time of transition off IV insulin drip. Glucose down to 44 mg/dl at 27:2521:14 on 3/6/641/1/17 likely from too much 70/30. Fasting glucose 157 mg/dl this morning. If patient is eating, recommend decreasing 70/30 to 35 units BID (which will provide 49 units for basal and 21 units for meal coverage per 24 hours).  Thanks, Orlando PennerMarie Aubrionna Istre, RN, MSN, CDE Diabetes Coordinator Inpatient Diabetes Program [phone] (Team Pager from 8am to 5pm) [phone] ([facility] office) [phone] [facility] office) [phone] [name] office)

## 2015-10-24 NOTE — Progress Notes (Signed)
Fbs back down to 62. Called MD to see if patient can be restarted on D5. Also explained patient received the 65 units of 70/30 before this shift started and has been drinking sodas and juice, following the hypoglycemic protocol. Dr. Clint GuyHower will enter order for IVF.

## 2015-10-24 NOTE — Progress Notes (Signed)
CRITICAL VALUE ALERT  Critical value received:  K 2.9  Date of notification:  10/24/2015  Time of notification:  0348  Critical value read back: yes  Nurse who received alert:  Carma LairFelicia Daisee Centner RN  MD notified (1st page):  0400   Responding MD:  Sudini  Time MD responded:  0400  Inquired if pt taking po, will add supplement.

## 2015-10-24 NOTE — Care Management (Signed)
[name] unable to arrange a ride home. Taxi voucher issued. [facility] Lelon MastLorrie Holt at [facility]. [facility] application, Medication Management, and resource information given to [name] during this visit. Discharge to home today.  Gwenette GreetBrenda S Oma Marzan RN MSN CCM Care Management [phone]

## 2015-10-24 NOTE — Progress Notes (Signed)
Pharmacy Consult for Electrolyte Monitoring and Replacement  Allergies  Allergen Reactions  . Penicillins Anaphylaxis, Hives and Swelling    Has patient had a PCN reaction causing immediate rash, facial/tongue/throat swelling, SOB or lightheadedness with hypotension: Yes Has patient had a PCN reaction causing severe rash involving mucus membranes or skin necrosis: No Has patient had a PCN reaction that required hospitalization Yes Has patient had a PCN reaction occurring within the last 10 years: Yes If all of the above answers are "NO", then may proceed with Cephalosporin use.  . Sulfa Antibiotics Anaphylaxis and Hives  . Clindamycin/Lincomycin Hives    Patient Measurements: Height: 5\' 8"  (172.7 cm) Weight: 227 lb (102.967 kg) IBW/kg (Calculated) : 68.4  Vital Signs: Temp: 98.6 F (37 C) (01/02 1214) Temp Source: Oral (01/02 1214) BP: 119/75 mmHg (01/02 1214) Pulse Rate: 84 (01/02 1214) Intake/Output from previous day: 01/01 0701 - 01/02 0700 In: 1623.8 [P.O.:1000; I.V.:623.8] Out: 2200 [Urine:2200] Intake/Output from this shift: Total I/O In: 290 [P.O.:240; IV Piggyback:50] Out: 425 [Urine:225; Stool:200]  Labs:  Recent Labs  10/23/15 0829 10/23/15 1240 10/24/15 0241  WBC 10.0 13.8* 11.0*  HGB 14.1 13.1 12.7*  HCT 43.6 39.4* 36.3*  PLT 357 342 291     Recent Labs  10/23/15 0829 10/23/15 1240 10/23/15 1700 10/24/15 0241  NA 126* 127* 140 135  K 6.0* 3.3* 3.1* 2.9*  CL 88* 100* 105 102  CO2 20* 19* 27 26  GLUCOSE 1140* 499* 141* 94  BUN 21* 19 14 9   CREATININE 1.50* 1.31*  1.29* 0.87 0.71  CALCIUM 9.3 9.3 9.4 8.8*  MG  --   --   --  1.6*  PROT 8.3*  --   --   --   ALBUMIN 5.2*  --   --   --   AST 22  --   --   --   ALT 18  --   --   --   ALKPHOS 138*  --   --   --   BILITOT 1.9*  --   --   --    Estimated Creatinine Clearance: 155.6 mL/min (by C-G formula based on Cr of 0.71).    Recent Labs  10/24/15 0006 10/24/15 0431 10/24/15 0735   GLUCAP 99 145* 157*    Medical History: Past Medical History  Diagnosis Date  . Diabetes mellitus without complication (HCC)   . Hydrocephalus     Medications:  Scheduled:  . enoxaparin (LOVENOX) injection  40 mg Subcutaneous Q24H  . famotidine (PEPCID) IV  20 mg Intravenous Q24H  . insulin aspart  0-5 Units Subcutaneous QHS  . insulin aspart  0-9 Units Subcutaneous TID WC  . insulin aspart protamine- aspart  35 Units Subcutaneous BID WC  . nicotine  21 mg Transdermal Daily  . sodium chloride  3 mL Intravenous Q12H   Infusions:    Assessment: Pharmacy consulted to assist in replacing electrolytes in this 30 y/o M with DKA.   K= 2.9, Mg= 1.6  Plan:  Will replace magnesium with magnesium sulfate 2 g iv once. Patient is receiving about 36 meq of K daily in IVF. In addition patient has orders for KCl 40 meq x 2 this AM. Will recheck K at 1200. Will not order am labs at this point as patient has discharge orders.   Mark [name], Mark [name] D 10/24/2015,12:38 PM

## 2015-10-24 NOTE — Discharge Summary (Addendum)
[facility] - [place] at [facility]   PATIENT NAME: Mark CoopMichael [name]    MR#:  [id number]  DATE OF BIRTH:  10/29/1985  DATE OF ADMISSION:  10/23/2015 ADMITTING PHYSICIAN: Alford Highlandichard Wieting, MD  DATE OF DISCHARGE: 10/24/2015  PRIMARY CARE PHYSICIAN: No PCP Per Patient    ADMISSION DIAGNOSIS:  Diabetic ketoacidosis without coma associated with type 1 diabetes mellitus (HCC) [E10.10]  DISCHARGE DIAGNOSIS:  Active Problems:   Diabetic keto-acidosis (HCC)   SECONDARY DIAGNOSIS:   Past Medical History  Diagnosis Date  . Diabetes mellitus without complication (HCC)   . Hydrocephalus     HOSPITAL COURSE:   30 year old male with type 1 diabetes on insulin who presented in mild DKA. For further details please refer the H&P.   1. DKA: This is secondary to noncompliance with insulin management. Diabetes coordinator was consulted during this admission. She recommends discharging the patient on insulin 70/33 5 units twice a day. He was initially presented DKA protocol. His DKA has resolved. He is tolerating his diet and blood sugars are acceptable. Patient reports that he is not able to drive and therefore cannot get to the drugstore to get his medications until he has a ride and this is what led him to be hyperglycemic.   2. Hypokalemia and magnesium: This was repleted prior to discharge.  3. Hyponatremia: This is secondary to DKA and improved with IV fluids.  4. Renal insufficiency: The secondary dehydration and DKA.  DISCHARGE CONDITIONS AND DIET:  Stable for discharge home on a diabetic diet  CONSULTS OBTAINED:  Treatment Team:  Alford Highlandichard Wieting, MD  DRUG ALLERGIES:   Allergies  Allergen Reactions  . Penicillins Anaphylaxis, Hives and Swelling    Has patient had a PCN reaction causing immediate rash, facial/tongue/throat swelling, SOB or lightheadedness with hypotension: Yes Has patient had a PCN reaction causing severe rash involving mucus membranes or  skin necrosis: No Has patient had a PCN reaction that required hospitalization Yes Has patient had a PCN reaction occurring within the last 10 years: Yes If all of the above answers are "NO", then may proceed with Cephalosporin use.  . Sulfa Antibiotics Anaphylaxis and Hives  . Clindamycin/Lincomycin Hives    DISCHARGE MEDICATIONS:   Current Discharge Medication List    START taking these medications   Details  insulin aspart protamine- aspart (NOVOLOG MIX 70/30) (70-30) 100 UNIT/ML injection Inject 0.35 mLs (35 Units total) into the skin 2 (two) times daily with a meal. Qty: 10 mL, Refills: 11    nicotine (NICODERM CQ - DOSED IN MG/24 HOURS) 21 mg/24hr patch Place 1 patch (21 mg total) onto the skin daily. Qty: 28 patch, Refills: 0      STOP taking these medications     insulin NPH-regular Human (NOVOLIN 70/30) (70-30) 100 UNIT/ML injection               Today   CHIEF COMPLAINT:  Patient is doing well this point. Patient reports no blurred vision or headache. No abdominal pain.   VITAL SIGNS:  Blood pressure 117/68, pulse 87, temperature 98.4 F (36.9 C), temperature source Oral, resp. rate 16, height 5\' 8"  (1.727 m), weight 102.967 kg (227 lb), SpO2 98 %.   REVIEW OF SYSTEMS:  Review of Systems  Constitutional: Negative for fever, chills and malaise/fatigue.  HENT: Negative for sore throat.   Eyes: Negative for blurred vision.  Respiratory: Negative for cough, hemoptysis, shortness of breath and wheezing.   Cardiovascular: Negative for chest pain, palpitations  and leg swelling.  Gastrointestinal: Negative for nausea, vomiting, abdominal pain, diarrhea and blood in stool.  Genitourinary: Negative for dysuria.  Musculoskeletal: Negative for back pain.  Neurological: Negative for dizziness, tremors and headaches.  Endo/Heme/Allergies: Does not bruise/bleed easily.     PHYSICAL EXAMINATION:  GENERAL:  30 y.o.-year-old patient lying in the bed with no acute  distress.  NECK:  Supple, no jugular venous distention. No thyroid enlargement, no tenderness.  LUNGS: Normal breath sounds bilaterally, no wheezing, rales,rhonchi  No use of accessory muscles of respiration.  CARDIOVASCULAR: S1, S2 normal. No murmurs, rubs, or gallops.  ABDOMEN: Soft, non-tender, non-distended. Bowel sounds present. No organomegaly or mass.  EXTREMITIES: No pedal edema, cyanosis, or clubbing.  PSYCHIATRIC: The patient is alert and oriented x 3.  SKIN: No obvious rash, lesion, or ulcer.   DATA REVIEW:   CBC  Recent Labs Lab 10/24/15 0241  WBC 11.0*  HGB 12.7*  HCT 36.3*  PLT 291    Chemistries   Recent Labs Lab 10/23/15 0829  10/24/15 0241 10/24/15 1218  NA 126*  < > 135  --   K 6.0*  < > 2.9* 3.9  CL 88*  < > 102  --   CO2 20*  < > 26  --   GLUCOSE 1140*  < > 94  --   BUN 21*  < > 9  --   CREATININE 1.50*  < > 0.71  --   CALCIUM 9.3  < > 8.8*  --   MG  --   --  1.6*  --   AST 22  --   --   --   ALT 18  --   --   --   ALKPHOS 138*  --   --   --   BILITOT 1.9*  --   --   --   < > = values in this interval not displayed.  Cardiac Enzymes No results for input(s): TROPONINI in the last 168 hours.  Microbiology Results  @[email or web address] @  RADIOLOGY:  Dg Chest Portable 1 View  10/23/2015  CLINICAL DATA:  Weakness. EXAM: PORTABLE CHEST 1 VIEW COMPARISON:  July 15, 2014. FINDINGS: The heart size and mediastinal contours are within normal limits. Both lungs are clear. No pneumothorax or pleural effusion is noted. The visualized skeletal structures are unremarkable. IMPRESSION: No acute cardiopulmonary abnormality seen. Electronically Signed   By: Lupita Raider, M.D.   On: 10/23/2015 09:00      Management plans discussed with the patient and he is in agreement. Stable for discharge home  Patient should follow up with M.D. in 1 week  CODE STATUS:     Code Status Orders        Start     Ordered   10/23/15 1012  Full code   Continuous      10/23/15 1012      TOTAL TIME TAKING CARE OF THIS PATIENT: 35 minutes.    Note: This dictation was prepared with Dragon dictation along with smaller phrase technology. Any transcriptional errors that result from this process are unintentional.  Caley Volkert M.D on 10/24/2015 at 2:30 PM  Between 7am to 6pm - Pager - [phone] After 6pm go to [email or web address] - password [email or web address]  [facility] [facility]  Office  [phone]  CC: Primary care physician; No PCP Per Patient

## 2015-10-24 NOTE — Clinical Social Work Note (Signed)
LCSW met with patient. He reports that he has attempted to apply for SSDI 3x and was denied. He explained his Mom does assist with his support and picks up 3 vials of insulin per month at [facility].  Patient is aggreeable to [facility]. LCSW with interview completed, Med Mg[facility] Application is complete It was explained that patient to follow up when discharged. He was hoping he could leave with a vial from here. It will be up to his medication/nurse team.  Enis Slipper LCSW [phone]

## 2015-10-25 LAB — BLOOD GAS, VENOUS
ACID-BASE DEFICIT: 4.8 mmol/L — AB (ref 0.0–2.0)
BICARBONATE: 20.5 meq/L — AB (ref 21.0–28.0)
PATIENT TEMPERATURE: 37
pCO2, Ven: 38 mmHg — ABNORMAL LOW (ref 44.0–60.0)
pH, Ven: 7.34 (ref 7.320–7.430)

## 2015-11-24 ENCOUNTER — Encounter: Payer: Self-pay | Admitting: Emergency Medicine

## 2015-11-24 ENCOUNTER — Inpatient Hospital Stay: Payer: Self-pay

## 2015-11-24 ENCOUNTER — Inpatient Hospital Stay
Admission: EM | Admit: 2015-11-24 | Discharge: 2015-11-25 | DRG: 638 | Disposition: A | Discharge status: Home / Self Care | Payer: Self-pay | Attending: Internal Medicine | Admitting: Internal Medicine

## 2015-11-24 DIAGNOSIS — F17210 Nicotine dependence, cigarettes, uncomplicated: Secondary | ICD-10-CM | POA: Diagnosis present

## 2015-11-24 DIAGNOSIS — R109 Unspecified abdominal pain: Secondary | ICD-10-CM

## 2015-11-24 DIAGNOSIS — E1110 Type 2 diabetes mellitus with ketoacidosis without coma: Secondary | ICD-10-CM | POA: Diagnosis present

## 2015-11-24 DIAGNOSIS — Z880 Allergy status to penicillin: Secondary | ICD-10-CM

## 2015-11-24 DIAGNOSIS — N179 Acute kidney failure, unspecified: Secondary | ICD-10-CM | POA: Diagnosis present

## 2015-11-24 DIAGNOSIS — Z79899 Other long term (current) drug therapy: Secondary | ICD-10-CM

## 2015-11-24 DIAGNOSIS — Z881 Allergy status to other antibiotic agents status: Secondary | ICD-10-CM

## 2015-11-24 DIAGNOSIS — Z882 Allergy status to sulfonamides status: Secondary | ICD-10-CM

## 2015-11-24 DIAGNOSIS — Z794 Long term (current) use of insulin: Secondary | ICD-10-CM

## 2015-11-24 DIAGNOSIS — Z9114 Patient's other noncompliance with medication regimen: Secondary | ICD-10-CM

## 2015-11-24 DIAGNOSIS — E871 Hypo-osmolality and hyponatremia: Secondary | ICD-10-CM | POA: Diagnosis present

## 2015-11-24 DIAGNOSIS — E1010 Type 1 diabetes mellitus with ketoacidosis without coma: Principal | ICD-10-CM

## 2015-11-24 DIAGNOSIS — E860 Dehydration: Secondary | ICD-10-CM | POA: Diagnosis present

## 2015-11-24 LAB — BASIC METABOLIC PANEL
ANION GAP: 11 (ref 5–15)
BUN: 20 mg/dL (ref 6–20)
CALCIUM: 9.5 mg/dL (ref 8.9–10.3)
CHLORIDE: 102 mmol/L (ref 101–111)
CO2: 22 mmol/L (ref 22–32)
Creatinine, Ser: 1.15 mg/dL (ref 0.61–1.24)
GFR calc non Af Amer: >60 mL/min (ref >60)
Glucose, Bld: 179 mg/dL — ABNORMAL HIGH (ref 65–99)
POTASSIUM: 3.6 mmol/L (ref 3.5–5.1)
Sodium: 135 mmol/L (ref 135–145)

## 2015-11-24 LAB — GLUCOSE, CAPILLARY
GLUCOSE-CAPILLARY: 355 mg/dL — AB (ref 65–99)
GLUCOSE-CAPILLARY: 155 mg/dL — AB (ref 65–99)
GLUCOSE-CAPILLARY: 155 mg/dL — AB (ref 65–99)
Glucose-Capillary: >600 mg/dL (ref 65–99)
Glucose-Capillary: 234 mg/dL — ABNORMAL HIGH (ref 65–99)
Glucose-Capillary: 150 mg/dL — ABNORMAL HIGH (ref 65–99)
Glucose-Capillary: 203 mg/dL — ABNORMAL HIGH (ref 65–99)
Glucose-Capillary: 198 mg/dL — ABNORMAL HIGH (ref 65–99)
Glucose-Capillary: 163 mg/dL — ABNORMAL HIGH (ref 65–99)

## 2015-11-24 LAB — URINALYSIS COMPLETE WITH MICROSCOPIC (ARMC ONLY)
BILIRUBIN URINE: NEGATIVE
GLUCOSE, UA: 500 mg/dL — AB
Hgb urine dipstick: NEGATIVE
LEUKOCYTES UA: NEGATIVE
NITRITE: NEGATIVE
Protein, ur: NEGATIVE mg/dL
SPECIFIC GRAVITY, URINE: 1.005 (ref 1.005–1.030)
SQUAMOUS EPITHELIAL / LPF: NONE SEEN
pH: 5 (ref 5.0–8.0)

## 2015-11-24 LAB — CBC
HCT: 44.3 % (ref 40.0–52.0)
Hemoglobin: 14.3 g/dL (ref 13.0–18.0)
MCH: 32.5 pg (ref 26.0–34.0)
MCHC: 32.3 g/dL (ref 32.0–36.0)
MCV: 100.9 fL — AB (ref 80.0–100.0)
PLATELETS: 422 10*3/uL (ref 150–440)
RBC: 4.39 MIL/uL — ABNORMAL LOW (ref 4.40–5.90)
RDW: 13.7 % (ref 11.5–14.5)
WBC: 16.1 10*3/uL — ABNORMAL HIGH (ref 3.8–10.6)

## 2015-11-24 LAB — BLOOD GAS, VENOUS
ACID-BASE DEFICIT: 13 mmol/L — AB (ref 0.0–2.0)
BICARBONATE: 14.5 meq/L — AB (ref 21.0–28.0)
FIO2: 21
PATIENT TEMPERATURE: 37
PCO2 VEN: 38 mmHg — AB (ref 44.0–60.0)
pH, Ven: 7.19 — CL (ref 7.320–7.430)

## 2015-11-24 LAB — LIPASE, BLOOD: LIPASE: 15 U/L (ref 11–51)

## 2015-11-24 LAB — COMPREHENSIVE METABOLIC PANEL
ALT: 17 U/L (ref 17–63)
ANION GAP: 22 — AB (ref 5–15)
AST: 18 U/L (ref 15–41)
Albumin: 4.7 g/dL (ref 3.5–5.0)
Alkaline Phosphatase: 128 U/L — ABNORMAL HIGH (ref 38–126)
BUN: 23 mg/dL — ABNORMAL HIGH (ref 6–20)
CHLORIDE: 92 mmol/L — AB (ref 101–111)
CO2: 15 mmol/L — AB (ref 22–32)
Calcium: 9.4 mg/dL (ref 8.9–10.3)
Creatinine, Ser: 1.54 mg/dL — ABNORMAL HIGH (ref 0.61–1.24)
GFR, EST NON AFRICAN AMERICAN: 59 mL/min — AB (ref >60)
Glucose, Bld: 769 mg/dL (ref 65–99)
POTASSIUM: 4.8 mmol/L (ref 3.5–5.1)
SODIUM: 129 mmol/L — AB (ref 135–145)
Total Bilirubin: 1.8 mg/dL — ABNORMAL HIGH (ref 0.3–1.2)
Total Protein: 8.5 g/dL — ABNORMAL HIGH (ref 6.5–8.1)

## 2015-11-24 LAB — MRSA PCR SCREENING: MRSA BY PCR: NEGATIVE

## 2015-11-24 MED ORDER — SODIUM CHLORIDE 0.9 % IV SOLN
INTRAVENOUS | Status: DC
  Administered 2015-11-24: 14:00:00 via INTRAVENOUS

## 2015-11-24 MED ORDER — ONDANSETRON HCL 4 MG/2ML IJ SOLN
4.0000 mg | INTRAMUSCULAR | Status: DC | PRN
  Administered 2015-11-24 – 2015-11-25 (×3): 4 mg via INTRAVENOUS
  Filled 2015-11-24 (×3): qty 2

## 2015-11-24 MED ORDER — ENOXAPARIN SODIUM 40 MG/0.4ML ~~LOC~~ SOLN
40.0000 mg | SUBCUTANEOUS | Status: DC
  Administered 2015-11-24: 40 mg via SUBCUTANEOUS
  Filled 2015-11-24 (×2): qty 0.4

## 2015-11-24 MED ORDER — SODIUM CHLORIDE 0.9 % IV BOLUS (SEPSIS)
1000.0000 mL | Freq: Once | INTRAVENOUS | Status: AC
  Administered 2015-11-24: 1000 mL via INTRAVENOUS

## 2015-11-24 MED ORDER — SODIUM CHLORIDE 0.9 % IV SOLN
INTRAVENOUS | Status: DC
Start: 2015-11-24 — End: 2015-11-24

## 2015-11-24 MED ORDER — PNEUMOCOCCAL VAC POLYVALENT 25 MCG/0.5ML IJ INJ: 0.5000 mL | INJECTION | INTRAMUSCULAR | Status: DC

## 2015-11-24 MED ORDER — MORPHINE SULFATE (PF) 4 MG/ML IV SOLN
4.0000 mg | Freq: Once | INTRAVENOUS | Status: AC
  Administered 2015-11-24: 4 mg via INTRAVENOUS
  Filled 2015-11-24: qty 1

## 2015-11-24 MED ORDER — POTASSIUM CHLORIDE 10 MEQ/100ML IV SOLN
10.0000 meq | INTRAVENOUS | Status: AC
  Administered 2015-11-24 (×2): 10 meq via INTRAVENOUS
  Filled 2015-11-24 (×2): qty 100

## 2015-11-24 MED ORDER — SODIUM CHLORIDE 0.9 % IV SOLN
INTRAVENOUS | Status: DC
  Administered 2015-11-24: 10.8 [IU]/h via INTRAVENOUS
  Filled 2015-11-24: qty 2.5

## 2015-11-24 MED ORDER — MORPHINE SULFATE (PF) 4 MG/ML IV SOLN
INTRAVENOUS | Status: AC
  Administered 2015-11-24: 4 mg
  Filled 2015-11-24: qty 1

## 2015-11-24 MED ORDER — INSULIN ASPART PROT & ASPART (70-30 MIX) 100 UNIT/ML ~~LOC~~ SUSP
35.0000 [IU] | Freq: Two times a day (BID) | SUBCUTANEOUS | Status: DC
  Administered 2015-11-24 – 2015-11-25 (×2): 35 [IU] via SUBCUTANEOUS
  Filled 2015-11-24 (×2): qty 35

## 2015-11-24 MED ORDER — ONDANSETRON HCL 4 MG/2ML IJ SOLN
4.0000 mg | Freq: Once | INTRAMUSCULAR | Status: AC
  Administered 2015-11-24: 4 mg via INTRAVENOUS
  Filled 2015-11-24: qty 2

## 2015-11-24 MED ORDER — DEXTROSE-NACL 5-0.45 % IV SOLN
INTRAVENOUS | Status: DC
  Administered 2015-11-24: 14:00:00 via INTRAVENOUS

## 2015-11-24 MED ORDER — MORPHINE SULFATE (PF) 2 MG/ML IV SOLN
0.5000 mg | Freq: Four times a day (QID) | INTRAVENOUS | Status: DC | PRN
  Administered 2015-11-24 (×2): 0.5 mg via INTRAVENOUS
  Filled 2015-11-24 (×2): qty 1

## 2015-11-24 MED ORDER — MORPHINE SULFATE (PF) 4 MG/ML IV SOLN
4.0000 mg | Freq: Once | INTRAVENOUS | Status: AC
  Administered 2015-11-24: 4 mg via INTRAVENOUS

## 2015-11-24 MED ORDER — INSULIN ASPART 100 UNIT/ML ~~LOC~~ SOLN
0.0000 [IU] | SUBCUTANEOUS | Status: DC
  Administered 2015-11-24 – 2015-11-25 (×2): 3 [IU] via SUBCUTANEOUS
  Administered 2015-11-25: 8 [IU] via SUBCUTANEOUS
  Administered 2015-11-25: 5 [IU] via SUBCUTANEOUS
  Administered 2015-11-25: 11 [IU] via SUBCUTANEOUS
  Filled 2015-11-24: qty 8
  Filled 2015-11-24: qty 11
  Filled 2015-11-24: qty 5
  Filled 2015-11-24 (×2): qty 3

## 2015-11-24 MED ORDER — PROMETHAZINE HCL 25 MG/ML IJ SOLN
INTRAMUSCULAR | Status: AC
  Administered 2015-11-24: 25 mg
  Filled 2015-11-24: qty 1

## 2015-11-24 NOTE — ED Notes (Signed)
Pt awaiting ICU bed at this time, continue to monitor.

## 2015-11-24 NOTE — Progress Notes (Signed)
Re-paged Dr. Juliene Pina about patient's pain and nausea (and previous vomiting). MD ordered 0.5 mg of morphine every  6 hours prn, 4 mg of zofran every four hours prn, and a portable abdominal (KUB) x-ray for patient due to patient's continued pain.

## 2015-11-24 NOTE — ED Notes (Signed)
Report given to Lauryn, RN.  

## 2015-11-24 NOTE — ED Notes (Signed)
RN attempted x 2 to start second IV, pt became agitated, cursing at RN, stated "Yall don't know what you're doing, you're making everything worse" RN explained to pt that second IV needed for pt's current condition and ICU placement. Pt states "i don't care, I don't want it and i don't need it" RN verbalized understanding at this time

## 2015-11-24 NOTE — Progress Notes (Signed)
Spoke with MD on the phone about patient's BMET results with CO2 22 and Anion Gap 11. CBGs in 150s. Patient normally takes 70/30 insulin at home so MD ordered 35 units of 70/30 bid, CBGs every 4 hours with sliding scale coverage, and a carb modified diet for patient. RN also paged Diabetes Coordinator per MD (and RN request) to get second confirmation about patient's insulin coverage. Diabetes Coordinator confirmed with RN to not cover carbs with glucostabilizer since patient to receive 70/30 insulin and to make sure patient has tray.

## 2015-11-24 NOTE — ED Provider Notes (Signed)
[facility] Emergency Department Provider Note  Time seen: 7:13 AM  I have reviewed the triage vital signs and the nursing notes.   HISTORY  Chief Complaint Hyperglycemia    HPI Mark [name] is a 30 y.o. male with type 1 diabetes, presents the emergency department with abdominal pain and nausea and vomiting. According to the patient he ran out of insulin yesterday morning. Since running out of insulin he states he has felt dehydrated, urinating more than normal, today he awoke with diffuse abdominal cramping/discomfort, nausea and vomiting so came to the emergency department for evaluation. Patient states he has not noticed blood sugar has been as the battery died in his glucometer last month. Patient states he ran out of insulin yesterday morning, and cannot give more insulin until tomorrow when they receive his mother social security check. Describes abdominal pain as moderate diffuse cramping. Describes nausea and vomiting as moderate.     Past Medical History  Diagnosis Date  . Diabetes mellitus without complication (HCC)   . Hydrocephalus     Patient Active Problem List   Diagnosis Date Noted  . Diabetic keto-acidosis (HCC) 10/23/2015    Past Surgical History  Procedure Laterality Date  . Kidney stone surgery      Current Outpatient Rx  Name  Route  Sig  Dispense  Refill  . insulin aspart protamine- aspart (NOVOLOG MIX 70/30) (70-30) 100 UNIT/ML injection   Subcutaneous   Inject 0.35 mLs (35 Units total) into the skin 2 (two) times daily with a meal.   10 mL   11   . nicotine (NICODERM CQ - DOSED IN MG/24 HOURS) 21 mg/24hr patch   Transdermal   Place 1 patch (21 mg total) onto the skin daily.   28 patch   0     Allergies Penicillins; Sulfa antibiotics; and Clindamycin/lincomycin  Family History  Problem Relation Age of Onset  . Breast cancer Mother     Social History Social History  Substance Use Topics  . Smoking status:  Current Every Day Smoker -- 1.00 packs/day    Types: Cigarettes  . Smokeless tobacco: Not on file  . Alcohol Use: No     Comment: ocasional     Review of Systems Constitutional: Negative for fever. Cardiovascular: Negative for chest pain. Respiratory: Negative for shortness of breath. Gastrointestinal: Positive for diffuse abdominal discomfort, nausea and vomiting. Negative diarrhea. Genitourinary: Negative for dysuria. Positive urinary frequency. Musculoskeletal: Negative for back pain. Neurological: Negative for headache 10-point ROS otherwise negative.  ____________________________________________   PHYSICAL EXAM:  Constitutional: Alert and oriented. Well appearing and in no distress. Eyes: Normal exam ENT   Head: Normocephalic and atraumatic.   Mouth/Throat: Mucous membranes are moist. Cardiovascular: Normal rate, regular rhythm. No murmur Respiratory: Normal respiratory effort without tachypnea nor retractions. Breath sounds are clear and equal bilaterally. No wheezes/rales/rhonchi. Gastrointestinal: Soft, mild diffuse abdominal tenderness. No rebound or guarding. No distention. Musculoskeletal: Nontender with normal range of motion in all extremities.  Neurologic:  Normal speech and language. No gross focal neurologic deficits Skin:  Skin is warm, dry and intact.  Psychiatric: Mood and affect are normal. Speech and behavior are normal.  ____________________________________________    INITIAL IMPRESSION / ASSESSMENT AND PLAN / ED COURSE  Pertinent labs & imaging results that were available during my care of the patient were reviewed by me and considered in my medical decision making (see chart for details).  Patient presents the emergency department with diffuse abdominal cramping/discomfort, nausea  and vomiting. Patient is type I diabetic and has been out of insulin since yesterday morning, approximately 24 hours. We will check labs, IV hydrate, treat pain and  nausea while awaiting lab results.  Patient's labs are consistent with significantly elevated blood glucose, anion gap of 22, 2+ ketones in his urine. We will start the patient on an insulin infusion and admitted to the hospital. We will check a VBG as well. Clinical picture most consistent with diabetic ketoacidosis.  CRITICAL CARE Performed by: Minna Antis   Total critical care time: 30 minutes  Critical care time was exclusive of separately billable procedures and treating other patients.  Critical care was necessary to treat or prevent imminent or life-threatening deterioration.  Critical care was time spent personally by me on the following activities: development of treatment plan with patient and/or surrogate as well as nursing, discussions with consultants, evaluation of patient's response to treatment, examination of patient, obtaining history from patient or surrogate, ordering and performing treatments and interventions, ordering and review of laboratory studies, ordering and review of radiographic studies, pulse oximetry and re-evaluation of patient's condition.   ____________________________________________   FINAL CLINICAL IMPRESSION(S) / ED DIAGNOSES  Hyperglycemia Diabetic ketoacidosis  Minna Antis, MD 11/24/15 [phone]

## 2015-11-24 NOTE — Progress Notes (Signed)
Patient vomited again despite zofran, MD paged.

## 2015-11-24 NOTE — Care Management (Signed)
Patient admitted frequently for DKA due to compliance issues.  He has been set up at the [facility] to receive free meds and does not pick them up.  Says that his insulin dose was increased and he ran out of his insulin and did not go to get it refilled.  Contacted [facility] and patient last filled his novolg 70/30.  Spoke with [facility] and it is reported that all of patient's prescriptions are on hold.  Insulin was last obtained 11/2013.  There is a script that was called i 10/24/2015 but has not been picked up.

## 2015-11-24 NOTE — ED Notes (Signed)
Pharmacy notified for insulin drip.

## 2015-11-24 NOTE — ED Notes (Signed)
Pt to ed via ems from home with high blood sugar. Pt last took his insulin yesterday. Also c/o n/v and abd pain. Ems reports vss. Pt a/ox3 on arrival to ed, edp at bedside on arrival.

## 2015-11-24 NOTE — H&P (Signed)
[facility] - [place] at [facility]   PATIENT NAME: Mark [name]    MR#:  [id number]  DATE OF BIRTH:  [birth year]  DATE OF ADMISSION:  11/24/2015  PRIMARY CARE PHYSICIAN: No PCP Per Patient   REQUESTING/REFERRING PHYSICIAN: Dr Lenard Lance  CHIEF COMPLAINT:  Elevated blood sugars and nausea  HISTORY OF PRESENT ILLNESS:  Mark [name]  is a 30 y.o. male with a known history of insulin requiring diabetes  who presents with above complaint. Patient is well-known to the hospital service. His last admission was 1 month ago for DKA. He reports that he has had to use more insulin than usual and ran out of his medications. He is supposed to pick up his insulin from [facility] tomorrow. He is noted to be in DKA in the emergency room. He has received 2 L of normal saline and will be placed on insulin drip.  PAST MEDICAL HISTORY:   Past Medical History  Diagnosis Date  . Diabetes mellitus without complication (HCC)   . Hydrocephalus     PAST SURGICAL HISTORY:   Past Surgical History  Procedure Laterality Date  . Kidney stone surgery      SOCIAL HISTORY:   Social History  Substance Use Topics  . Smoking status: Current Every Day Smoker -- 1.00 packs/day    Types: Cigarettes  . Smokeless tobacco: Not on file  . Alcohol Use: No     Comment: ocasional     FAMILY HISTORY:   Family History  Problem Relation Age of Onset  . Breast cancer Mother     DRUG ALLERGIES:   Allergies  Allergen Reactions  . Penicillins Anaphylaxis, Hives and Swelling    Has patient had a PCN reaction causing immediate rash, facial/tongue/throat swelling, SOB or lightheadedness with hypotension: Yes Has patient had a PCN reaction causing severe rash involving mucus membranes or skin necrosis: No Has patient had a PCN reaction that required hospitalization Yes Has patient had a PCN reaction occurring within the last 10 years: Yes If all of the above answers are "NO", then may  proceed with Cephalosporin use.  . Sulfa Antibiotics Anaphylaxis and Hives  . Clindamycin/Lincomycin Hives     REVIEW OF SYSTEMS:  CONSTITUTIONAL: No fever, fatigue or weakness.  EYES: No blurred or double vision.  EARS, NOSE, AND THROAT: No tinnitus or ear pain.  RESPIRATORY: No cough, shortness of breath, wheezing or hemoptysis.  CARDIOVASCULAR: No chest pain, orthopnea, edema.  GASTROINTESTINAL: ++ nausea, vomiting,NO diarrhea or abdominal pain.  GENITOURINARY: No dysuria, hematuria.  ENDOCRINE: ++ polyuria, NO nocturia,  HEMATOLOGY: No anemia, easy bruising or bleeding SKIN: No rash or lesion. MUSCULOSKELETAL: No joint pain or arthritis.   NEUROLOGIC: No tingling, numbness, weakness.  PSYCHIATRY: No anxiety or depression.   MEDICATIONS AT HOME:   Prior to Admission medications   Medication Sig Start Date End Date Taking? Authorizing Provider  insulin aspart protamine- aspart (NOVOLOG MIX 70/30) (70-30) 100 UNIT/ML injection  65 units in the morning and 45 units at night  10/24/15  Yes Dima Mini, MD  nicotine (NICODERM CQ - DOSED IN MG/24 HOURS) 21 mg/24hr patch Place 1 patch (21 mg total) onto the skin daily. 10/24/15  Yes Adrian Saran, MD      VITAL SIGNS:  Blood pressure 138/63, pulse 120, temperature 97.9 F (36.6 C), temperature source Oral, resp. rate 20, height  (1.88 m), weight 104.327 kg (230 lb), SpO2 96 %.  PHYSICAL EXAMINATION:  GENERAL:  30 y.o.-year-old patient lying  in the bed with no acute distress.  EYES: Pupils equal, round, reactive to light and accommodation. No scleral icterus. Extraocular muscles intact.  HEENT: Head atraumatic, normocephalic. Oropharynx and nasopharynx clear.  NECK:  Supple, no jugular venous distention. No thyroid enlargement, no tenderness.  LUNGS: Normal breath sounds bilaterally, no wheezing, rales,rhonchi or crepitation. No use of accessory muscles of respiration.  CARDIOVASCULAR: S1, S2 normal. No murmurs, rubs, or gallops.   ABDOMEN: Soft, nontender, nondistended. Bowel sounds present. No organomegaly or mass.  EXTREMITIES: No pedal edema, cyanosis, or clubbing.  NEUROLOGIC: Cranial nerves II through XII are grossly intact. No focal deficits. PSYCHIATRIC: The patient is alert and oriented x 3.  SKIN: No obvious rash, lesion, or ulcer.   LABORATORY PANEL:   CBC  Recent Labs Lab 11/24/15 0716  WBC 16.1*  HGB 14.3  HCT 44.3  PLT 422   ------------------------------------------------------------------------------------------------------------------  Chemistries   Recent Labs Lab 11/24/15 0716  NA 129*  K 4.8  CL 92*  CO2 15*  GLUCOSE 769*  BUN 23*  CREATININE 1.54*  CALCIUM 9.4  AST 18  ALT 17  ALKPHOS 128*  BILITOT 1.8*   ------------------------------------------------------------------------------------------------------------------  Cardiac Enzymes No results for input(s): TROPONINI in the last 168 hours. ------------------------------------------------------------------------------------------------------------------  RADIOLOGY:  No results found.  EKG:    IMPRESSION AND PLAN:    30 year old male with history of insulin requiring diabetes who presents in DKA.    1. DKA: Patient has a significant elevation in blood glucose, anion gap of 22 and 2+ ketones in urine CONSISTENT with DKA. Patient was given 2 L of normal saline in the emergency room. I will continue with aggressive hydration and insulin drip. He will be placed on DKA protocol. BMP every 4 hours add Accu-Cheks every hour. Patient is currently nothing by mouth. Once blood glucose level is less than 200 patient may be switched from normal saline to D5. Once anion gap closes and CO2 is greater than 18, patient will be initiated onto his home regimen of insulin and will be able to eat a diet.   2. Hyponatremia: This is pseudohyponatremia from elevated blood sugar.  3. Acute kidney injury: This is from DKA and  dehydration. Continue IV fluids and repeat BMP in a.m.  4. Leukocytosis: This is due to dehydration and  artificially elevated. Peak CBC in a.m. No signs of infection. All the records are reviewed and case discussed with ED provider. Management plans discussed with the patient and he is in agreement.  CODE STATUS: FULL  Critical care TOTAL TIME TAKING CARE OF THIS PATIENT: 50 minutes. s   Hjalmar Ballengee M.D on 11/24/2015 at 10:27 AM  Between 7am to 6pm - Pager - [phone] After 6pm go to [email or web address] - password [email or web address] [facility]  [place] [facility]  Office  [phone]  CC: Primary care physician; No PCP Per Patient

## 2015-11-24 NOTE — Progress Notes (Signed)
Spoke with MD about patient's nausea and vomiting despite previous zofran dose. Read x-ray results to MD. MD ordered one time extra dose of zofran 4 mg.

## 2015-11-24 NOTE — Progress Notes (Signed)
Paged Admitting MD, Dr. Juliene Pina, about patient's pain level (10/10 abdominal pain) and vomiting and continued nausea.

## 2015-11-25 LAB — HEMOGLOBIN A1C: HEMOGLOBIN A1C: 9.2 % — AB (ref 4.0–6.0)

## 2015-11-25 LAB — GLUCOSE, CAPILLARY
GLUCOSE-CAPILLARY: 153 mg/dL — AB (ref 65–99)
GLUCOSE-CAPILLARY: 216 mg/dL — AB (ref 65–99)
GLUCOSE-CAPILLARY: 263 mg/dL — AB (ref 65–99)
Glucose-Capillary: 226 mg/dL — ABNORMAL HIGH (ref 65–99)
Glucose-Capillary: 338 mg/dL — ABNORMAL HIGH (ref 65–99)

## 2015-11-25 LAB — BASIC METABOLIC PANEL
Anion gap: 7 (ref 5–15)
BUN: 9 mg/dL (ref 6–20)
CHLORIDE: 98 mmol/L — AB (ref 101–111)
CO2: 27 mmol/L (ref 22–32)
CREATININE: 0.72 mg/dL (ref 0.61–1.24)
Calcium: 9 mg/dL (ref 8.9–10.3)
Glucose, Bld: 164 mg/dL — ABNORMAL HIGH (ref 65–99)
POTASSIUM: 3.9 mmol/L (ref 3.5–5.1)
SODIUM: 132 mmol/L — AB (ref 135–145)

## 2015-11-25 MED ORDER — LIVING WELL WITH DIABETES BOOK
Freq: Once | Status: AC
  Administered 2015-11-25: 13:00:00
  Filled 2015-11-25: qty 1

## 2015-11-25 MED ORDER — INSULIN ASPART PROT & ASPART (70-30 MIX) 100 UNIT/ML ~~LOC~~ SUSP: 35.0000 [IU] | Freq: Two times a day (BID) | SUBCUTANEOUS | Status: DC

## 2015-11-25 NOTE — Discharge Summary (Signed)
Mark [name], 30 y.o., DOB 08-12-1986, MRN [id number]. Admission date: 11/24/2015 Discharge Date 11/25/2015 Primary MD No PCP Per Patient Admitting Physician Adrian Saran, MD  Admission Diagnosis  Diabetic ketoacidosis without coma associated with type 1 diabetes mellitus (HCC) [E10.10]  Discharge Diagnosis   Active Problems:   DKA (diabetic ketoacidoses) [facility]) Medical noncompliance        Hospital Course patient is a 30 year old male with recurrent admissions to the hospital for DKA due to med noncompliance presented with nausea and elevated blood sugars. He was admitted to the ICU given IV fluids placed on insulin drip. Patient's anion gap resolved his blood sugars are stable he was tolerating lunch well be stable for discharge.            Consults  None  Significant Tests:  See full reports for all details    Dg Abd Portable 1v  11/24/2015  CLINICAL DATA:  Increase generalized abdominal pain for the past 2-3 days. EXAM: PORTABLE ABDOMEN - 1 VIEW COMPARISON:  CT abdomen and pelvis 10/09/2010. FINDINGS: Mild motion artifact. Gas is present throughout small and large bowel to the level of the rectum. No dilated loops of bowel are seen to suggest obstruction. Limited evaluation for free air on this supine study. Small calcification versus artifact projecting over the lateral right hepatic lobe/posterior eleventh rib. IMPRESSION: Nonobstructed bowel-gas pattern. Electronically Signed   By: Sebastian Ache M.D.   On: 11/24/2015 15:15       Today   Subjective:   [name]  Feels ok no complaits  Objective:   Blood pressure 122/72, pulse 103, temperature 98.5 F (36.9 C), temperature source Oral, resp. rate 14, height  (1.88 m), weight 103.1 kg (227 lb 4.7 oz), SpO2 96 %.  .  Intake/Output Summary (Last 24 hours) at 11/25/15 1429 Last data filed at 11/25/15 1020  Gross per 24 hour  Intake 1741.96 ml  Output   1750 ml  Net  -8.04 ml    Exam VITAL SIGNS: Blood  pressure 122/72, pulse 103, temperature 98.5 F (36.9 C), temperature source Oral, resp. rate 14, height  (1.88 m), weight 103.1 kg (227 lb 4.7 oz), SpO2 96 %.  GENERAL:  30 y.o.-year-old patient lying in the bed with no acute distress.  EYES: Pupils equal, round, reactive to light and accommodation. No scleral icterus. Extraocular muscles intact.  HEENT: Head atraumatic, normocephalic. Oropharynx and nasopharynx clear.  NECK:  Supple, no jugular venous distention. No thyroid enlargement, no tenderness.  LUNGS: Normal breath sounds bilaterally, no wheezing, rales,rhonchi or crepitation. No use of accessory muscles of respiration.  CARDIOVASCULAR: S1, S2 normal. No murmurs, rubs, or gallops.  ABDOMEN: Soft, nontender, nondistended. Bowel sounds present. No organomegaly or mass.  EXTREMITIES: No pedal edema, cyanosis, or clubbing.  NEUROLOGIC: Cranial nerves II through XII are intact. Muscle strength 5/5 in all extremities. Sensation intact. Gait not checked.  PSYCHIATRIC: The patient is alert and oriented x 3.  SKIN: No obvious rash, lesion, or ulcer.   Data Review     CBC w Diff: Lab Results  Component Value Date   WBC 16.1* 11/24/2015   WBC 9.4 01/20/2015   HGB 14.3 11/24/2015   HGB 15.2 01/20/2015   HCT 44.3 11/24/2015   HCT 47.3 01/20/2015   PLT 422 11/24/2015   PLT 313 01/20/2015   LYMPHOPCT 28.2 07/19/2014   MONOPCT 4.7 07/19/2014   EOSPCT 1.3 07/19/2014   BASOPCT 0.1 07/19/2014   CMP: Lab Results  Component Value  Date   NA 132* 11/25/2015   NA 123* 01/20/2015   K 3.9 11/25/2015   K 4.3 01/20/2015   CL 98* 11/25/2015   CL 91* 01/20/2015   CO2 27 11/25/2015   CO2 20* 01/20/2015   BUN 9 11/25/2015   BUN 12 01/20/2015   CREATININE 0.72 11/25/2015   CREATININE 1.22 01/20/2015   PROT 8.5* 11/24/2015   PROT 8.1 01/20/2015   ALBUMIN 4.7 11/24/2015   ALBUMIN 4.7 01/20/2015   BILITOT 1.8* 11/24/2015   BILITOT 0.9 01/20/2015   ALKPHOS 128* 11/24/2015   ALKPHOS  123 01/20/2015   AST 18 11/24/2015   AST 30 01/20/2015   ALT 17 11/24/2015   ALT 16* 01/20/2015  .  Micro Results Recent Results (from the past 240 hour(s))  MRSA PCR Screening     Status: None   Collection Time: 11/24/15  2:15 PM  Result Value Ref Range Status   MRSA by PCR NEGATIVE NEGATIVE Final    Comment:        The GeneXpert MRSA Assay (FDA approved for NASAL specimens only), is one component of a comprehensive MRSA colonization surveillance program. It is not intended to diagnose MRSA infection nor to guide or monitor treatment for MRSA infections.         Code Status Orders        Start     Ordered   11/24/15 1357  Full code   Continuous     11/24/15 1356    Code Status History    Date Active Date Inactive Code Status Order ID Comments User Context   10/23/2015 10:12 AM 10/24/2015  8:27 PM Full Code [id number]  Alford Highland, MD ED          Follow-up Information    Follow up with pcp In 7 days.      Discharge Medications     Medication List    TAKE these medications        insulin aspart protamine- aspart (70-30) 100 UNIT/ML injection  Commonly known as:  NOVOLOG MIX 70/30  Inject 0.35 mLs (35 Units total) into the skin 2 (two) times daily with a meal.     nicotine 21 mg/24hr patch  Commonly known as:  NICODERM CQ - dosed in mg/24 hours  Place 1 patch (21 mg total) onto the skin daily.           Total Time in preparing paper work, data evaluation and todays exam - 35 minutes  Auburn Bilberry M.D on 11/25/2015 at 2:29 [facility]  [facility]   Office  [phone]

## 2015-11-25 NOTE — Care Management (Signed)
Care Manager spoke with patient prior to his discharge and informed patient that [facility] has one vial of insulin that he could use in stock.  He will go to the clinic and get it at discharge.  Patient is adamant that "I get all of my over the counter drugs like clock work.  I get three vials of my insulin.  I do not need a prescription- I just get it.He denies that he is noncompliant with his insulin regime.

## 2015-11-25 NOTE — Progress Notes (Signed)
Inpatient Diabetes Program Recommendations  AACE/ADA: New Consensus Statement on Inpatient Glycemic Control (2015)  Target Ranges:  Prepandial:   less than 140 mg/dL      Peak postprandial:   less than 180 mg/dL (1-2 hours)      Critically ill patients:  140 - 180 mg/dL  Results for Mark [name], Mark [name] (MRN [id number]) as of 11/25/2015 09:26  Ref. Range 11/24/2015 16:32 11/24/2015 17:30 11/24/2015 18:34 11/24/2015 19:35 11/25/2015 00:13 11/25/2015 03:49 11/25/2015 08:02  Glucose-Capillary Latest Ref Range: 65-99 mg/dL 629 (H) 528 (H)  41/32 35 units 198 (H)   163 (H)  Novolog 3 units 263 (H)  Novolog 8 units 226 (H)  Novolog 5 units 338 (H)  Novolog 11 units  70/30 35 units  Results for Mark [name], Mark [name] (MRN [id number]) as of 11/25/2015 09:26  Ref. Range 11/24/2015 07:16  Glucose Latest Ref Range: 65-99 mg/dL 366 (HH)   Review of Glycemic Control  Outpatient Diabetes medications: 70/30 35 units BID Current orders for Inpatient glycemic control: 70/30 35 units BID, Novolog 0-15 units TID with meals, Novolog 0-5 units QHS  Inpatient Diabetes Program Recommendations: Insulin - Basal: Please consider increasing 70/30 to 40 units BID (with breakfast and supper).  NOTE: Patient admitted with DKA and was on an insulin drip which was transitioned off yesterday afternoon. Patient received 70/30 35 units at 17:26 on 11/24/15 and Glucose 338 mg/dl this morning.  Thanks, [name], RN, MSN, CDE Diabetes Coordinator Inpatient Diabetes Program [phone] (Team Pager from 8am to 5pm) [phone] ([facility] office) [phone] [facility] office) [phone] [facility] office)'

## 2015-11-25 NOTE — Progress Notes (Addendum)
Pt discharge instructions reviewed. Pt stated he did not want to discuss discharge instructions. Nurse attempted to finish discharge instructions when Pt tried to remove IV. Nurse was able to convince Pt to allow nurse to remove IV. IV removed with catheter intact. Pt signed discharge instructions and pt himself a cab. [facility] notified of pt discharge.  Pt dressed himself and stated he would not be wheeled to any waiting area because he called his own cab. Pt walked out of ICU with pt's clothing. Prescription was handed to pt and he was encouraged to pick up prescription.

## 2015-11-25 NOTE — Discharge Instructions (Signed)
°  DIET:  Diabetic diet  DISCHARGE CONDITION:  Stable  ACTIVITY:  Activity as tolerated  OXYGEN:  Home Oxygen: No.   Oxygen Delivery: room air  DISCHARGE LOCATION:  home    ADDITIONAL DISCHARGE INSTRUCTION:take ur insulin as prescribed   If you experience worsening of your admission symptoms, develop shortness of breath, life threatening emergency, suicidal or homicidal thoughts you must seek medical attention immediately by calling 911 or calling your MD immediately  if symptoms less severe.  You Must read complete instructions/literature along with all the possible adverse reactions/side effects for all the Medicines you take and that have been prescribed to you. Take any new Medicines after you have completely understood and accpet all the possible adverse reactions/side effects.   Please note  You were cared for by a hospitalist during your hospital stay. If you have any questions about your discharge medications or the care you received while you were in the hospital after you are discharged, you can call the unit and asked to speak with the hospitalist on call if the hospitalist that took care of you is not available. Once you are discharged, your primary care physician will handle any further medical issues. Please note that NO REFILLS for any discharge medications will be authorized once you are discharged, as it is imperative that you return to your primary care physician (or establish a relationship with a primary care physician if you do not have one) for your aftercare needs so that they can reassess your need for medications and monitor your lab values.

## 2015-11-26 ENCOUNTER — Ambulatory Visit (HOSPITAL_COMMUNITY)
Admission: AD | Admit: 2015-11-26 | Discharge: 2015-11-26 | Disposition: A | Discharge status: Home / Self Care | Payer: Self-pay | Source: Other Acute Inpatient Hospital | Attending: Emergency Medicine | Admitting: Emergency Medicine

## 2015-11-26 ENCOUNTER — Emergency Department
Admission: EM | Admit: 2015-11-26 | Discharge: 2015-11-26 | Disposition: A | Discharge status: Other Acute Inpatient Hospital | Payer: Self-pay | Attending: Emergency Medicine | Admitting: Emergency Medicine

## 2015-11-26 ENCOUNTER — Encounter: Payer: Self-pay | Admitting: Emergency Medicine

## 2015-11-26 ENCOUNTER — Emergency Department: Payer: Self-pay

## 2015-11-26 DIAGNOSIS — R42 Dizziness and giddiness: Secondary | ICD-10-CM | POA: Insufficient documentation

## 2015-11-26 DIAGNOSIS — R739 Hyperglycemia, unspecified: Secondary | ICD-10-CM | POA: Insufficient documentation

## 2015-11-26 DIAGNOSIS — R51 Headache: Secondary | ICD-10-CM | POA: Insufficient documentation

## 2015-11-26 DIAGNOSIS — R112 Nausea with vomiting, unspecified: Secondary | ICD-10-CM

## 2015-11-26 DIAGNOSIS — R1033 Periumbilical pain: Secondary | ICD-10-CM

## 2015-11-26 DIAGNOSIS — E1010 Type 1 diabetes mellitus with ketoacidosis without coma: Secondary | ICD-10-CM | POA: Insufficient documentation

## 2015-11-26 DIAGNOSIS — Z794 Long term (current) use of insulin: Secondary | ICD-10-CM | POA: Insufficient documentation

## 2015-11-26 DIAGNOSIS — Z880 Allergy status to penicillin: Secondary | ICD-10-CM | POA: Insufficient documentation

## 2015-11-26 DIAGNOSIS — R509 Fever, unspecified: Secondary | ICD-10-CM | POA: Insufficient documentation

## 2015-11-26 DIAGNOSIS — F17210 Nicotine dependence, cigarettes, uncomplicated: Secondary | ICD-10-CM | POA: Insufficient documentation

## 2015-11-26 DIAGNOSIS — R079 Chest pain, unspecified: Secondary | ICD-10-CM | POA: Insufficient documentation

## 2015-11-26 LAB — URINALYSIS COMPLETE WITH MICROSCOPIC (ARMC ONLY)
BILIRUBIN URINE: NEGATIVE
Glucose, UA: >500 mg/dL — AB
HGB URINE DIPSTICK: NEGATIVE
Leukocytes, UA: NEGATIVE
Nitrite: NEGATIVE
PH: 6 (ref 5.0–8.0)
PROTEIN: NEGATIVE mg/dL
SQUAMOUS EPITHELIAL / LPF: NONE SEEN
Specific Gravity, Urine: 1.021 (ref 1.005–1.030)

## 2015-11-26 LAB — CBC
HCT: 43.6 % (ref 40.0–52.0)
Hemoglobin: 14.5 g/dL (ref 13.0–18.0)
MCH: 33.9 pg (ref 26.0–34.0)
MCHC: 33.1 g/dL (ref 32.0–36.0)
MCV: 102.2 fL — ABNORMAL HIGH (ref 80.0–100.0)
PLATELETS: 442 10*3/uL — AB (ref 150–440)
RBC: 4.27 MIL/uL — AB (ref 4.40–5.90)
RDW: 13.9 % (ref 11.5–14.5)
WBC: 19 10*3/uL — AB (ref 3.8–10.6)

## 2015-11-26 LAB — BASIC METABOLIC PANEL
Anion gap: 27 — ABNORMAL HIGH (ref 5–15)
BUN: 22 mg/dL — ABNORMAL HIGH (ref 6–20)
CALCIUM: 9.4 mg/dL (ref 8.9–10.3)
CO2: 12 mmol/L — ABNORMAL LOW (ref 22–32)
CREATININE: 1.38 mg/dL — AB (ref 0.61–1.24)
Chloride: 94 mmol/L — ABNORMAL LOW (ref 101–111)
GFR calc non Af Amer: >60 mL/min (ref >60)
Glucose, Bld: 723 mg/dL (ref 65–99)
Potassium: 5.5 mmol/L — ABNORMAL HIGH (ref 3.5–5.1)
SODIUM: 133 mmol/L — AB (ref 135–145)

## 2015-11-26 LAB — BLOOD GAS, VENOUS
Acid-base deficit: 18.2 mmol/L — ABNORMAL HIGH (ref 0.0–2.0)
Bicarbonate: 10.2 mEq/L — ABNORMAL LOW (ref 21.0–28.0)
PATIENT TEMPERATURE: 37
PCO2 VEN: 32 mmHg — AB (ref 44.0–60.0)
PH VEN: 7.11 — AB (ref 7.320–7.430)

## 2015-11-26 LAB — GLUCOSE, CAPILLARY
GLUCOSE-CAPILLARY: 468 mg/dL — AB (ref 65–99)
GLUCOSE-CAPILLARY: 301 mg/dL — AB (ref 65–99)
Glucose-Capillary: >600 mg/dL (ref 65–99)

## 2015-11-26 LAB — HEPATIC FUNCTION PANEL
ALBUMIN: 4.6 g/dL (ref 3.5–5.0)
ALK PHOS: 116 U/L (ref 38–126)
ALT: 17 U/L (ref 17–63)
AST: 19 U/L (ref 15–41)
BILIRUBIN TOTAL: 1.6 mg/dL — AB (ref 0.3–1.2)
Bilirubin, Direct: 0.2 mg/dL (ref 0.1–0.5)
Indirect Bilirubin: 1.4 mg/dL — ABNORMAL HIGH (ref 0.3–0.9)
TOTAL PROTEIN: 8.4 g/dL — AB (ref 6.5–8.1)

## 2015-11-26 LAB — LIPASE, BLOOD: LIPASE: 15 U/L (ref 11–51)

## 2015-11-26 LAB — TROPONIN I: Troponin I: <0.03 ng/mL (ref <0.031)

## 2015-11-26 MED ORDER — SODIUM CHLORIDE 0.9 % IV BOLUS (SEPSIS)
1000.0000 mL | Freq: Once | INTRAVENOUS | Status: AC
  Administered 2015-11-26: 1000 mL via INTRAVENOUS

## 2015-11-26 MED ORDER — SODIUM CHLORIDE 0.9 % IV SOLN
INTRAVENOUS | Status: DC
  Administered 2015-11-26: 5.4 [IU]/h via INTRAVENOUS
  Filled 2015-11-26: qty 2.5

## 2015-11-26 MED ORDER — MORPHINE SULFATE (PF) 4 MG/ML IV SOLN
4.0000 mg | Freq: Once | INTRAVENOUS | Status: AC
  Administered 2015-11-26: 4 mg via INTRAVENOUS
  Filled 2015-11-26: qty 1

## 2015-11-26 MED ORDER — METOCLOPRAMIDE HCL 5 MG/ML IJ SOLN
10.0000 mg | Freq: Once | INTRAMUSCULAR | Status: AC
  Administered 2015-11-26: 10 mg via INTRAVENOUS
  Filled 2015-11-26: qty 2

## 2015-11-26 MED ORDER — ONDANSETRON HCL 4 MG/2ML IJ SOLN
INTRAMUSCULAR | Status: AC
  Administered 2015-11-26: 4 mg via INTRAVENOUS
  Filled 2015-11-26: qty 2

## 2015-11-26 MED ORDER — ONDANSETRON HCL 4 MG/2ML IJ SOLN
4.0000 mg | Freq: Once | INTRAMUSCULAR | Status: AC | PRN
  Administered 2015-11-26: 4 mg via INTRAVENOUS

## 2015-11-26 NOTE — ED Notes (Signed)
MD at bedside. 

## 2015-11-26 NOTE — ED Notes (Signed)
Pt presents to ED via EMS from personal home with c/o of hyperglycemia (too high). EMS states pt was recently discharged yesterday from [facility] and has continued to experience progressing worsening sx. EMS states pt has a hx of presenting sx and non-compliance. Pt arrived to ER self-inducing vomiting with fingers. Pt states this helps presenting sx. Pt states he has lower abdominal pain 10/10. Pt arrived to ER alert and oriented x4.

## 2015-11-26 NOTE — ED Notes (Signed)
Continuing to make patient aware of fall risk regarding presenting sx and NPO status, pt refusing medical advice and continuing to drink water from water sink.

## 2015-11-26 NOTE — ED Notes (Signed)
Patient made aware of NPO status. Pt noted walking to water fountain and drinking water. MD made aware.

## 2015-11-26 NOTE — ED Notes (Addendum)
Pt continues to drink from bedside water sink, aware of negative consequences.

## 2015-11-26 NOTE — ED Provider Notes (Signed)
[facility] Emergency Department Provider Note  ____________________________________________  Time seen: Approximately 4:01 AM  I have reviewed the triage vital signs and the nursing notes.   HISTORY  Chief Complaint Hyperglycemia    HPI Mark [name] is a 30 y.o. male who comes into the hospital today with vomiting, abdominal pain and elevated blood sugar. The patient was admitted to the hospital in the ICU for DKA and discharged home today. The patient reports that he was not feeling well and still having stomach issues when he was discharged. He reports that he has been vomiting all day long. The patient reports he called the hospital 90 minutes ago to try to get his insulin but he reports that he has not been receiving help. The patient reports that he could not take the bottle of insulin that was offered the ICU because the taxi would not allow him to wait. According to the previous ICU note the patient stated he had enough insulin at home. The patient reports that this same abdominal pain he had when he was admitted previously. He reports that he has been vomiting a yellowish color. He reports that he's had some chest pain and shortness of breath as well as some headache dizziness and fever. During the exam the patient says can we hurry this along because he just needs some insulin. The patient rates his pain as a 10/10 in intensity.   Past Medical History  Diagnosis Date  . Diabetes mellitus without complication (HCC)   . Hydrocephalus     Patient Active Problem List   Diagnosis Date Noted  . DKA (diabetic ketoacidoses) (HCC) 11/24/2015  . Diabetic keto-acidosis (HCC) 10/23/2015    Past Surgical History  Procedure Laterality Date  . Kidney stone surgery      Current Outpatient Rx  Name  Route  Sig  Dispense  Refill  . insulin aspart protamine- aspart (NOVOLOG MIX 70/30) (70-30) 100 UNIT/ML injection   Subcutaneous   Inject 0.35 mLs (35 Units  total) into the skin 2 (two) times daily with a meal. Patient not taking: Reported on 11/26/2015   10 mL   11   . nicotine (NICODERM CQ - DOSED IN MG/24 HOURS) 21 mg/24hr patch   Transdermal   Place 1 patch (21 mg total) onto the skin daily. Patient not taking: Reported on 11/26/2015   28 patch   0     Allergies Penicillins; Sulfa antibiotics; and Clindamycin/lincomycin  Family History  Problem Relation Age of Onset  . Breast cancer Mother     Social History Social History  Substance Use Topics  . Smoking status: Current Every Day Smoker -- 1.00 packs/day    Types: Cigarettes  . Smokeless tobacco: None  . Alcohol Use: No     Comment: ocasional     Review of Systems Constitutional: No fever/chills Eyes: No visual changes. ENT: No sore throat. Cardiovascular:  chest pain. Respiratory:  shortness of breath. Gastrointestinal: Abdominal pain nausea and vomiting Genitourinary: Negative for dysuria. Musculoskeletal: Negative for back pain. Skin: Negative for rash. Neurological: Headache, dizziness and lightheadedness  10-point ROS otherwise negative.  ____________________________________________   PHYSICAL EXAM:  VITAL SIGNS: ED Triage Vitals  Enc Vitals Group     BP 11/26/15 0323 102/73 mmHg     Pulse Rate 11/26/15 0323 60     Resp 11/26/15 0323 26     Temp 11/26/15 0323 97.8 F (36.6 C)     Temp Source 11/26/15 0323 Oral  SpO2 11/26/15 0323 95 %     Weight 11/26/15 0323 218 lb (98.884 kg)     Height 11/26/15 0323  (1.88 m)     Head Cir --      Peak Flow --      Pain Score 11/26/15 0326 10     Pain Loc --      Pain Edu? --      Excl. in GC? --     Constitutional: Alert and oriented. Well appearing and in moderate distress. Eyes: Conjunctivae are normal. PERRL. EOMI. Head: Atraumatic. Nose: No congestion/rhinnorhea. Mouth/Throat: Mucous membranes are moist.  Oropharynx non-erythematous. Cardiovascular: Normal rate, regular rhythm. Grossly  normal heart sounds.  Good peripheral circulation. Respiratory: Normal respiratory effort.  No retractions. Lungs CTAB. Gastrointestinal: Soft with diffuse tenderness to palpation. No distention. Musculoskeletal: No lower extremity tenderness nor edema.  Neurologic:  Normal speech and language.  Skin:  Skin is warm, dry and intact. Psychiatric: Mood and affect are normal.   ____________________________________________   LABS (all labs ordered are listed, but only abnormal results are displayed)  Labs Reviewed  BASIC METABOLIC PANEL - Abnormal; Notable for the following:    Sodium 133 (*)    Potassium 5.5 (*)    Chloride 94 (*)    CO2 12 (*)    Glucose, Bld 723 (*)    BUN 22 (*)    Creatinine, Ser 1.38 (*)    Anion gap 27 (*)    All other components within normal limits  CBC - Abnormal; Notable for the following:    WBC 19.0 (*)    RBC 4.27 (*)    MCV 102.2 (*)    Platelets 442 (*)    All other components within normal limits  URINALYSIS COMPLETEWITH MICROSCOPIC ([facility] ONLY) - Abnormal; Notable for the following:    Color, Urine COLORLESS (*)    APPearance CLEAR (*)    Glucose, UA >500 (*)    Ketones, ur 2+ (*)    Bacteria, UA RARE (*)    All other components within normal limits  GLUCOSE, CAPILLARY - Abnormal; Notable for the following:    Glucose-Capillary >600 (*)    All other components within normal limits  BLOOD GAS, VENOUS - Abnormal; Notable for the following:    pH, Ven 7.11 (*)    pCO2, Ven 32 (*)    Bicarbonate 10.2 (*)    Acid-base deficit 18.2 (*)    All other components within normal limits  HEPATIC FUNCTION PANEL - Abnormal; Notable for the following:    Total Protein 8.4 (*)    Total Bilirubin 1.6 (*)    Indirect Bilirubin 1.4 (*)    All other components within normal limits  GLUCOSE, CAPILLARY - Abnormal; Notable for the following:    Glucose-Capillary >600 (*)    All other components within normal limits  GLUCOSE, CAPILLARY - Abnormal; Notable  for the following:    Glucose-Capillary >600 (*)    All other components within normal limits  LIPASE, BLOOD  TROPONIN I  CBG MONITORING, ED   ____________________________________________  EKG  ED ECG REPORT I, Rebecka Apley, the attending physician, personally viewed and interpreted this ECG.   Date: 11/26/2015  EKG Time: 431  Rate: 127  Rhythm: sinus tachycardia  Axis: normal  Intervals:none  ST&T Change: none  ____________________________________________  RADIOLOGY  CXR: negative portable chest ____________________________________________   PROCEDURES  Procedure(s) performed: None  Critical Care performed: Yes, see critical care note(s)  CRITICAL  CARE Performed by: Lucrezia Europe P   Total critical care time: 30 minutes  Critical care time was exclusive of separately billable procedures and treating other patients.  Critical care was necessary to treat or prevent imminent or life-threatening deterioration.  Critical care was time spent personally by me on the following activities: development of treatment plan with patient and/or surrogate as well as nursing, discussions with consultants, evaluation of patient's response to treatment, examination of patient, obtaining history from patient or surrogate, ordering and performing treatments and interventions, ordering and review of laboratory studies, ordering and review of radiographic studies, pulse oximetry and re-evaluation of patient's condition.  ____________________________________________   INITIAL IMPRESSION / ASSESSMENT AND PLAN / ED COURSE  Pertinent labs & imaging results that were available during my care of the patient were reviewed by me and considered in my medical decision making (see chart for details).  This is a 30 year old male who comes into the hospital with a history of type 1 diabetes who was admitted to the hospital in the intensive care unit and comes back in today. The  patient does have a history of noncompliance and according to the ICU offered him insulin and he turned it down saying that he had multiple bottles at home. I will give the patient a liter of normal saline while he awaits the results of his blood work. The patient is drinking water from the sink although we have informed him that he should not. The patient has been vomiting due to the water.  After looking at the blood work appears that the patient has DKA. I did start the patient on insulin drip and give him another liter of normal saline. As we do not have any beds in the hospital that are capable of caring for the patient I will have to transfer him to another facility. I contacted [facility] as the patient reports that he would prefer to go to [facility]. [facility] reports that they will accept the patient and recommends continuing to hydrate the patient with IV fluids. The patient did receive a dose of morphine as well as Reglan for his pain and his vomiting. The patient was accepted to the medicine intensive care unit. He will be transferred by [facility]. ____________________________________________   FINAL CLINICAL IMPRESSION(S) / ED DIAGNOSES  Final diagnoses:  Diabetic ketoacidosis without coma associated with type 1 diabetes mellitus (HCC)  Non-intractable vomiting with nausea, vomiting of unspecified type  Periumbilical abdominal pain      Rebecka Apley, MD 11/26/15 [id number]

## 2015-11-26 NOTE — ED Notes (Addendum)
Pt continues to drink from bedside water sink, aware of negative consequences.  

## 2015-11-29 LAB — GLUCOSE, CAPILLARY: GLUCOSE-CAPILLARY: 235 mg/dL — AB (ref 65–99)

## 2015-12-21 ENCOUNTER — Encounter: Payer: Self-pay | Admitting: Emergency Medicine

## 2015-12-21 ENCOUNTER — Emergency Department
Admission: EM | Admit: 2015-12-21 | Discharge: 2015-12-21 | Disposition: A | Discharge status: Other Acute Inpatient Hospital | Payer: Self-pay | Attending: Emergency Medicine | Admitting: Emergency Medicine

## 2015-12-21 ENCOUNTER — Emergency Department: Payer: Self-pay

## 2015-12-21 ENCOUNTER — Ambulatory Visit (HOSPITAL_COMMUNITY)
Admission: AD | Admit: 2015-12-21 | Discharge: 2015-12-21 | Disposition: A | Discharge status: Home / Self Care | Payer: MEDICAID | Source: Other Acute Inpatient Hospital | Attending: Emergency Medicine | Admitting: Emergency Medicine

## 2015-12-21 DIAGNOSIS — Z794 Long term (current) use of insulin: Secondary | ICD-10-CM | POA: Insufficient documentation

## 2015-12-21 DIAGNOSIS — Z880 Allergy status to penicillin: Secondary | ICD-10-CM | POA: Insufficient documentation

## 2015-12-21 DIAGNOSIS — E1310 Other specified diabetes mellitus with ketoacidosis without coma: Secondary | ICD-10-CM | POA: Insufficient documentation

## 2015-12-21 DIAGNOSIS — E0810 Diabetes mellitus due to underlying condition with ketoacidosis without coma: Secondary | ICD-10-CM | POA: Insufficient documentation

## 2015-12-21 DIAGNOSIS — R112 Nausea with vomiting, unspecified: Secondary | ICD-10-CM | POA: Insufficient documentation

## 2015-12-21 DIAGNOSIS — F17210 Nicotine dependence, cigarettes, uncomplicated: Secondary | ICD-10-CM | POA: Insufficient documentation

## 2015-12-21 DIAGNOSIS — M79659 Pain in unspecified thigh: Secondary | ICD-10-CM | POA: Insufficient documentation

## 2015-12-21 DIAGNOSIS — L0291 Cutaneous abscess, unspecified: Secondary | ICD-10-CM | POA: Insufficient documentation

## 2015-12-21 LAB — BASIC METABOLIC PANEL
ANION GAP: 29 — AB (ref 5–15)
ANION GAP: 24 — AB (ref 5–15)
ANION GAP: 16 — AB (ref 5–15)
BUN: 28 mg/dL — AB (ref 6–20)
BUN: 30 mg/dL — ABNORMAL HIGH (ref 6–20)
BUN: 23 mg/dL — ABNORMAL HIGH (ref 6–20)
CALCIUM: 9.1 mg/dL (ref 8.9–10.3)
CALCIUM: 8.5 mg/dL — AB (ref 8.9–10.3)
CALCIUM: 8.6 mg/dL — AB (ref 8.9–10.3)
CHLORIDE: 102 mmol/L (ref 101–111)
CO2: 9 mmol/L — ABNORMAL LOW (ref 22–32)
CO2: 8 mmol/L — AB (ref 22–32)
CO2: 13 mmol/L — ABNORMAL LOW (ref 22–32)
Chloride: 95 mmol/L — ABNORMAL LOW (ref 101–111)
Chloride: 111 mmol/L (ref 101–111)
Creatinine, Ser: 2.14 mg/dL — ABNORMAL HIGH (ref 0.61–1.24)
Creatinine, Ser: 2.17 mg/dL — ABNORMAL HIGH (ref 0.61–1.24)
Creatinine, Ser: 1.67 mg/dL — ABNORMAL HIGH (ref 0.61–1.24)
GFR calc Af Amer: 46 mL/min — ABNORMAL LOW (ref >60)
GFR calc non Af Amer: 39 mL/min — ABNORMAL LOW (ref >60)
GFR, EST AFRICAN AMERICAN: 45 mL/min — AB (ref >60)
GFR, EST NON AFRICAN AMERICAN: 39 mL/min — AB (ref >60)
GFR, EST NON AFRICAN AMERICAN: 53 mL/min — AB (ref >60)
GLUCOSE: 853 mg/dL — AB (ref 65–99)
GLUCOSE: 353 mg/dL — AB (ref 65–99)
Glucose, Bld: 916 mg/dL (ref 65–99)
POTASSIUM: 6.2 mmol/L — AB (ref 3.5–5.1)
POTASSIUM: 5.5 mmol/L — AB (ref 3.5–5.1)
POTASSIUM: 3.8 mmol/L (ref 3.5–5.1)
SODIUM: 133 mmol/L — AB (ref 135–145)
SODIUM: 140 mmol/L (ref 135–145)
Sodium: 134 mmol/L — ABNORMAL LOW (ref 135–145)

## 2015-12-21 LAB — URINALYSIS COMPLETE WITH MICROSCOPIC (ARMC ONLY)
BACTERIA UA: NONE SEEN
Bilirubin Urine: NEGATIVE
LEUKOCYTES UA: NEGATIVE
NITRITE: NEGATIVE
Protein, ur: NEGATIVE mg/dL
SPECIFIC GRAVITY, URINE: 1.021 (ref 1.005–1.030)
Squamous Epithelial / LPF: NONE SEEN
WBC, UA: NONE SEEN WBC/hpf (ref 0–5)
pH: 5 (ref 5.0–8.0)

## 2015-12-21 LAB — GLUCOSE, CAPILLARY
GLUCOSE-CAPILLARY: 596 mg/dL — AB (ref 65–99)
GLUCOSE-CAPILLARY: 482 mg/dL — AB (ref 65–99)
Glucose-Capillary: >600 mg/dL (ref 65–99)
Glucose-Capillary: 324 mg/dL — ABNORMAL HIGH (ref 65–99)
Glucose-Capillary: 278 mg/dL — ABNORMAL HIGH (ref 65–99)
Glucose-Capillary: 212 mg/dL — ABNORMAL HIGH (ref 65–99)

## 2015-12-21 LAB — CBC
HEMATOCRIT: 43.5 % (ref 40.0–52.0)
HEMOGLOBIN: 13.3 g/dL (ref 13.0–18.0)
MCH: 32.6 pg (ref 26.0–34.0)
MCHC: 30.5 g/dL — AB (ref 32.0–36.0)
MCV: 107.1 fL — ABNORMAL HIGH (ref 80.0–100.0)
Platelets: 446 10*3/uL — ABNORMAL HIGH (ref 150–440)
RBC: 4.06 MIL/uL — ABNORMAL LOW (ref 4.40–5.90)
RDW: 14.7 % — AB (ref 11.5–14.5)
WBC: 27.8 10*3/uL — AB (ref 3.8–10.6)

## 2015-12-21 LAB — BETA-HYDROXYBUTYRIC ACID: Beta-Hydroxybutyric Acid: >8 mmol/L — ABNORMAL HIGH (ref 0.05–0.27)

## 2015-12-21 MED ORDER — VANCOMYCIN HCL IN DEXTROSE 1-5 GM/200ML-% IV SOLN
1000.0000 mg | Freq: Once | INTRAVENOUS | Status: AC
  Administered 2015-12-21: 1000 mg via INTRAVENOUS
  Filled 2015-12-21: qty 200

## 2015-12-21 MED ORDER — INSULIN REGULAR HUMAN 100 UNIT/ML IJ SOLN
INTRAMUSCULAR | Status: DC
  Administered 2015-12-21: 5.4 [IU]/h via INTRAVENOUS
  Filled 2015-12-21: qty 2.5

## 2015-12-21 MED ORDER — DEXTROSE-NACL 5-0.45 % IV SOLN
INTRAVENOUS | Status: DC
  Administered 2015-12-21: 17:00:00 via INTRAVENOUS

## 2015-12-21 MED ORDER — SODIUM CHLORIDE 0.9 % IV SOLN
Freq: Once | INTRAVENOUS | Status: AC
  Administered 2015-12-21: 16:00:00 via INTRAVENOUS

## 2015-12-21 MED ORDER — SODIUM CHLORIDE 0.9 % IV SOLN
Freq: Once | INTRAVENOUS | Status: AC
  Administered 2015-12-21: 12:00:00 via INTRAVENOUS

## 2015-12-21 NOTE — ED Notes (Addendum)
Pt arrived via EMS for complaints nausea,vomiting and elevated blood sugar. Pt reports has been out of insulin for three days. EMS reports VSS, CBG reads high. Pt alert and oriented on arrival. Pt noted with strong fruity odor to breath.

## 2015-12-21 NOTE — ED Notes (Signed)
Patient asking for a diet sprite.  Informed the patient that his sugar is still elevated and he is on a insulin drip to help bring it down.  Informed him that water would be a better choice at this time.  Patient states "get the fuck out of my room and go get me what I want.  This is why I should have gone to [facility]".  Informed him I would speak with the MD.

## 2015-12-21 NOTE — ED Provider Notes (Signed)
[facility] Emergency Department Provider Note  ____________________________________________  Time seen: Approximately 11:57 AM  I have reviewed the triage vital signs and the nursing notes.   HISTORY  Chief Complaint Nausea; hypergylcemia ; and Emesis    HPI Mark [name] is a 30 y.o. male who reports he's been out of his insulin for 3 days. He's been vomiting since yesterday. And after vomiting developed pain in the left side of his chest. It hurts to push on the left side of his ribs. Patient's smells ketotic on arrival. He is breathing fast. He says his belly doesn't hurt. Patient reports his mother is on her death bed and he cannot get her money to get his insulin. Patient has been here repeatedly in DKA. Patient denies any fever or cough or other complaints except for above vomiting and pain in the left side of his chest.   Past Medical History  Diagnosis Date  . Diabetes mellitus without complication (HCC)   . Hydrocephalus     Patient Active Problem List   Diagnosis Date Noted  . DKA (diabetic ketoacidoses) (HCC) 11/24/2015  . Diabetic keto-acidosis (HCC) 10/23/2015    Past Surgical History  Procedure Laterality Date  . Kidney stone surgery      Current Outpatient Rx  Name  Route  Sig  Dispense  Refill  . insulin aspart protamine- aspart (NOVOLOG MIX 70/30) (70-30) 100 UNIT/ML injection   Subcutaneous   Inject 0.35 mLs (35 Units total) into the skin 2 (two) times daily with a meal.   10 mL   11   . nicotine (NICODERM CQ - DOSED IN MG/24 HOURS) 21 mg/24hr patch   Transdermal   Place 1 patch (21 mg total) onto the skin daily. Patient not taking: Reported on 11/26/2015   28 patch   0     Allergies Penicillins; Sulfa antibiotics; and Clindamycin/lincomycin  Family History  Problem Relation Age of Onset  . Breast cancer Mother     Social History Social History  Substance Use Topics  . Smoking status: Current Every Day Smoker  -- 1.00 packs/day    Types: Cigarettes  . Smokeless tobacco: None  . Alcohol Use: No     Comment: ocasional     Review of Systems Constitutional: No fever/chills Eyes: No visual changes. ENT: No sore throat. Cardiovascular: See history of present illness Respiratory: Denies shortness of breath. Gastrointestinal: No abdominal pain.  No nausea, no vomiting.  No diarrhea.  No constipation. Genitourinary: Negative for dysuria. Musculoskeletal: Negative for back pain. Skin: Negative for rash. Neurological: Negative for headaches, focal weakness or numbness.  10-point ROS otherwise negative.  ____________________________________________   PHYSICAL EXAM:  VITAL SIGNS: ED Triage Vitals  Enc Vitals Group     BP 12/21/15 1030 131/63 mmHg     Pulse Rate 12/21/15 1031 129     Resp 12/21/15 1030 24     Temp 12/21/15 1031 98.6 F (37 C)     Temp Source 12/21/15 1031 Oral     SpO2 12/21/15 1031 95 %     Weight 12/21/15 1031 230 lb (104.327 kg)     Height 12/21/15 1031  (1.88 m)     Head Cir --      Peak Flow --      Pain Score 12/21/15 1033 7     Pain Loc --      Pain Edu? --      Excl. in GC? --     Constitutional:  Alert and oriented. Well appearing and in no acute distress. Eyes: Conjunctivae are normal. PERRL. EOMI. Head: Atraumatic. Nose: No congestion/rhinnorhea. Mouth/Throat: Mucous membranes are moist.  Oropharynx non-erythematous. Neck: No stridor Cardiovascular: Normal rate, regular rhythm. Grossly normal heart sounds.  Good peripheral circulation. Respiratory: Normal respiratory effort.  No retractions. Lungs CTAB. Chest wall is remarkable for tenderness on palpation of the left side of her chest and axillary line and he also has some skin lesions there that look like healing skin abscesses about 3-4 tender in the area around the abscesses Gastrointestinal: Soft and nontender. No distention. No abdominal bruits. No CVA tenderness. Musculoskeletal: Patient  complains of some crampy pain in his thighs. Neurologic:  Normal speech and language. No gross focal neurologic deficits are appreciated. No gait instability. Skin:  Skin is warm, dry and intact. No rash noted. Psychiatric: Mood and affect are normal. Speech and behavior are normal.  ____________________________________________   LABS (all labs ordered are listed, but only abnormal results are displayed)  Labs Reviewed  GLUCOSE, CAPILLARY - Abnormal; Notable for the following:    Glucose-Capillary >600 (*)    All other components within normal limits  BASIC METABOLIC PANEL - Abnormal; Notable for the following:    Sodium 133 (*)    Potassium 6.2 (*)    Chloride 95 (*)    CO2 9 (*)    Glucose, Bld 916 (*)    BUN 28 (*)    Creatinine, Ser 2.14 (*)    GFR calc non Af Amer 39 (*)    GFR calc Af Amer 46 (*)    Anion gap 29 (*)    All other components within normal limits  CBC - Abnormal; Notable for the following:    WBC 27.8 (*)    RBC 4.06 (*)    MCV 107.1 (*)    MCHC 30.5 (*)    RDW 14.7 (*)    Platelets 446 (*)    All other components within normal limits  URINALYSIS COMPLETEWITH MICROSCOPIC ([facility] ONLY) - Abnormal; Notable for the following:    Color, Urine STRAW (*)    APPearance CLEAR (*)    Glucose, UA >500 (*)    Ketones, ur 2+ (*)    Hgb urine dipstick 1+ (*)    All other components within normal limits  BLOOD GAS, ARTERIAL - Abnormal; Notable for the following:    pH, Arterial 7.16 (*)    pCO2 arterial 19 (*)    pO2, Arterial 116 (*)    Bicarbonate 6.8 (*)    Acid-base deficit 19.9 (*)    All other components within normal limits  BETA-HYDROXYBUTYRIC ACID - Abnormal; Notable for the following:    Beta-Hydroxybutyric Acid >8.00 (*)    All other components within normal limits  BASIC METABOLIC PANEL - Abnormal; Notable for the following:    Sodium 134 (*)    Potassium 5.5 (*)    CO2 8 (*)    Glucose, Bld 853 (*)    BUN 30 (*)    Creatinine, Ser 2.17 (*)     Calcium 8.5 (*)    GFR calc non Af Amer 39 (*)    GFR calc Af Amer 45 (*)    Anion gap 24 (*)    All other components within normal limits  GLUCOSE, CAPILLARY - Abnormal; Notable for the following:    Glucose-Capillary >600 (*)    All other components within normal limits  GLUCOSE, CAPILLARY - Abnormal; Notable for the following:  Glucose-Capillary 596 (*)    All other components within normal limits  GLUCOSE, CAPILLARY - Abnormal; Notable for the following:    Glucose-Capillary 482 (*)    All other components within normal limits  GLUCOSE, CAPILLARY - Abnormal; Notable for the following:    Glucose-Capillary 324 (*)    All other components within normal limits  BASIC METABOLIC PANEL  CBG MONITORING, ED   ____________________________________________  EKG   ____________________________________________  RADIOLOGY  Chest x-ray read by me as normal waiting for the radiologist report ____________________________________________   PROCEDURES Pa  Transfer patient is lying in bed he has been sipping fluids which she insisted on doing his sugars coming down slowly his remains awake alert oriented 3tient is getting normal saline IV. We will start an insulin drip. During his blood gas and then we don't have any ICU beds currently available and patient asked to go to [facility] anyway soon as I get the blood gas we will begin making arrangements to transfer him. Because of the patient's left-sided rib pain and the skin abscesses I will begin him on some vancomycin ____________________________________________   INITIAL IMPRESSION / ASSESSMENT AND PLAN / ED COURSE  Pertinent labs & imaging results that were available during my care of the patient were reviewed by me and considered in my medical decision making (see chart for details). Blood gas shows a pH 7.16 CO2 of 1902 of 116 [facility] has no beds we have no beds in the ICU I [facility] bed but lost it while I was talking to them [facility] has no beds in [place] has no beds. At [facility] had one bed open up we will transfer the patient to determine regional which is also call [facility] Critical care time one and a half hours including phone calls to multiple different hospitals and repeated evaluations of the patient's electrolytes etc. ____________________________________________   FINAL CLINICAL IMPRESSION(S) / ED DIAGNOSES  Final diagnoses:  Diabetic ketoacidosis without coma associated with diabetes mellitus due to underlying condition [facility])      Arnaldo Natal, MD 12/21/15 [phone]

## 2015-12-21 NOTE — ED Notes (Signed)
Dr. Darnelle Catalan notified of blood glucose 853

## 2015-12-21 NOTE — ED Notes (Signed)
CK added on to existing specimen, lab notified.

## 2015-12-21 NOTE — ED Notes (Signed)
Blood Glu = 482

## 2015-12-21 NOTE — ED Notes (Signed)
Dr. Darnelle Catalan notified of pH 7.16, pCO2 19, pO2 116, HCO3 6.8, acid-base deficit 19.9

## 2015-12-21 NOTE — ED Notes (Signed)
Dr. Darnelle Catalan notified of glucose 916

## 2015-12-21 NOTE — Care Management Note (Signed)
Case Management Note  Patient Details  Name: Mark [name] MRN: [id number] Date of Birth: [birth year]  Subjective/Objective:     Spoke to patient about recent transfer to [facility], and he states although he does have a relationship with [facility] and medication managaement clinic, he has missed the last appt. He had. He states this is because his mother is very ill/ on her deathbed with Hospice at home. I have explained that we are in a bed crunch again and may have to transfer him out, he is amenable and asking for transfer out. I have let him know I will notify the MD, but that we still have to work him up. I have let the MD know of the patient request.               Action/Plan:   Expected Discharge Date:                  Expected Discharge Plan:     In-House Referral:     Discharge planning Services     Post Acute Care Choice:    Choice offered to:     DME Arranged:    DME Agency:     HH Arranged:    HH Agency:     Status of Service:     Medicare Important Message Given:    Date Medicare IM Given:    Medicare IM give by:    Date Additional Medicare IM Given:    Additional Medicare Important Message give by:     If discussed at Long Length of Stay Meetings, dates discussed:    Additional Comments:  Berna Bue, RN 12/21/2015, 12:31 PM

## 2015-12-21 NOTE — ED Notes (Signed)
Dr. Darnelle Catalan reassessing patient prior to departure.

## 2015-12-21 NOTE — ED Notes (Addendum)
Bed assignment [facility]  cc22 Report # [phone] [address], [address]

## 2016-01-02 LAB — BLOOD GAS, ARTERIAL
Acid-base deficit: 19.9 mmol/L — ABNORMAL HIGH (ref 0.0–2.0)
Bicarbonate: 6.8 mEq/L — ABNORMAL LOW (ref 21.0–28.0)
O2 Saturation: 97.2 %
Patient temperature: 37
pCO2 arterial: 19 mmHg — CL (ref 32.0–48.0)
pH, Arterial: 7.16 — CL (ref 7.350–7.450)
pO2, Arterial: 116 mmHg — ABNORMAL HIGH (ref 83.0–108.0)

## 2016-01-27 ENCOUNTER — Telehealth: Payer: Self-pay

## 2016-01-27 NOTE — Telephone Encounter (Signed)
[facility]  Casimiro NeedleMichael has not been established with [facility].  I gave him information and link to get application and eligibility requirements from our website to get him established.

## 2016-02-24 ENCOUNTER — Emergency Department
Admission: EM | Admit: 2016-02-24 | Discharge: 2016-02-25 | Disposition: A | Discharge status: Home / Self Care | Payer: Self-pay | Attending: Emergency Medicine | Admitting: Emergency Medicine

## 2016-02-24 DIAGNOSIS — F17210 Nicotine dependence, cigarettes, uncomplicated: Secondary | ICD-10-CM | POA: Insufficient documentation

## 2016-02-24 DIAGNOSIS — F1193 Opioid use, unspecified with withdrawal: Secondary | ICD-10-CM | POA: Insufficient documentation

## 2016-02-24 DIAGNOSIS — E1165 Type 2 diabetes mellitus with hyperglycemia: Secondary | ICD-10-CM | POA: Insufficient documentation

## 2016-02-24 DIAGNOSIS — R739 Hyperglycemia, unspecified: Secondary | ICD-10-CM

## 2016-02-24 DIAGNOSIS — R45851 Suicidal ideations: Secondary | ICD-10-CM | POA: Insufficient documentation

## 2016-02-24 DIAGNOSIS — F1120 Opioid dependence, uncomplicated: Secondary | ICD-10-CM

## 2016-02-24 LAB — CBC
HEMATOCRIT: 40.5 % (ref 40.0–52.0)
Hemoglobin: 13.4 g/dL (ref 13.0–18.0)
MCH: 30.7 pg (ref 26.0–34.0)
MCHC: 33.2 g/dL (ref 32.0–36.0)
MCV: 92.6 fL (ref 80.0–100.0)
PLATELETS: 307 10*3/uL (ref 150–440)
RBC: 4.38 MIL/uL — AB (ref 4.40–5.90)
RDW: 12.7 % (ref 11.5–14.5)
WBC: 12.3 10*3/uL — AB (ref 3.8–10.6)

## 2016-02-24 LAB — COMPREHENSIVE METABOLIC PANEL
ALBUMIN: 4.4 g/dL (ref 3.5–5.0)
ALT: 14 U/L — ABNORMAL LOW (ref 17–63)
AST: 21 U/L (ref 15–41)
Alkaline Phosphatase: 96 U/L (ref 38–126)
Anion gap: 13 (ref 5–15)
BILIRUBIN TOTAL: 1.5 mg/dL — AB (ref 0.3–1.2)
BUN: 17 mg/dL (ref 6–20)
CHLORIDE: 99 mmol/L — AB (ref 101–111)
CO2: 20 mmol/L — ABNORMAL LOW (ref 22–32)
Calcium: 9 mg/dL (ref 8.9–10.3)
Creatinine, Ser: 1.25 mg/dL — ABNORMAL HIGH (ref 0.61–1.24)
GFR calc Af Amer: >60 mL/min (ref >60)
GFR calc non Af Amer: >60 mL/min (ref >60)
GLUCOSE: 624 mg/dL — AB (ref 65–99)
POTASSIUM: 4.9 mmol/L (ref 3.5–5.1)
Sodium: 132 mmol/L — ABNORMAL LOW (ref 135–145)
Total Protein: 7.5 g/dL (ref 6.5–8.1)

## 2016-02-24 LAB — URINALYSIS COMPLETE WITH MICROSCOPIC (ARMC ONLY)
BACTERIA UA: NONE SEEN
Bilirubin Urine: NEGATIVE
Glucose, UA: >500 mg/dL — AB
Leukocytes, UA: NEGATIVE
Nitrite: NEGATIVE
PROTEIN: NEGATIVE mg/dL
SQUAMOUS EPITHELIAL / LPF: NONE SEEN
Specific Gravity, Urine: 1.021 (ref 1.005–1.030)
pH: 6 (ref 5.0–8.0)

## 2016-02-24 LAB — GLUCOSE, CAPILLARY
GLUCOSE-CAPILLARY: 542 mg/dL — AB (ref 65–99)
GLUCOSE-CAPILLARY: 301 mg/dL — AB (ref 65–99)

## 2016-02-24 LAB — URINE DRUG SCREEN, QUALITATIVE (ARMC ONLY)
AMPHETAMINES, UR SCREEN: NOT DETECTED
Barbiturates, Ur Screen: NOT DETECTED
Benzodiazepine, Ur Scrn: NOT DETECTED
Cannabinoid 50 Ng, Ur ~~LOC~~: NOT DETECTED
Cocaine Metabolite,Ur ~~LOC~~: NOT DETECTED
MDMA (ECSTASY) UR SCREEN: NOT DETECTED
METHADONE SCREEN, URINE: NOT DETECTED
Opiate, Ur Screen: NOT DETECTED
Phencyclidine (PCP) Ur S: NOT DETECTED
TRICYCLIC, UR SCREEN: POSITIVE — AB

## 2016-02-24 LAB — BLOOD GAS, VENOUS
Acid-base deficit: 2.2 mmol/L — ABNORMAL HIGH (ref 0.0–2.0)
Bicarbonate: 23.7 mEq/L (ref 21.0–28.0)
O2 Saturation: 54.9 %
PCO2 VEN: 44 mmHg (ref 44.0–60.0)
PH VEN: 7.34 (ref 7.320–7.430)
Patient temperature: 37
pO2, Ven: 31 mmHg (ref 31.0–45.0)

## 2016-02-24 LAB — ACETAMINOPHEN LEVEL

## 2016-02-24 LAB — ETHANOL

## 2016-02-24 LAB — SALICYLATE LEVEL: Salicylate Lvl: <4 mg/dL (ref 2.8–30.0)

## 2016-02-24 MED ORDER — INSULIN ASPART 100 UNIT/ML ~~LOC~~ SOLN
10.0000 [IU] | Freq: Once | SUBCUTANEOUS | Status: AC
  Administered 2016-02-24: 10 [IU] via INTRAVENOUS
  Filled 2016-02-24: qty 10

## 2016-02-24 MED ORDER — INSULIN ASPART 100 UNIT/ML ~~LOC~~ SOLN
0.0000 [IU] | SUBCUTANEOUS | Status: DC
  Filled 2016-02-24: qty 0.24

## 2016-02-24 MED ORDER — PROMETHAZINE HCL 25 MG PO TABS
25.0000 mg | ORAL_TABLET | Freq: Once | ORAL | Status: AC
  Administered 2016-02-24: 25 mg via ORAL
  Filled 2016-02-24: qty 1

## 2016-02-24 MED ORDER — LORAZEPAM 2 MG PO TABS
2.0000 mg | ORAL_TABLET | Freq: Once | ORAL | Status: AC
  Administered 2016-02-24: 2 mg via ORAL

## 2016-02-24 MED ORDER — SODIUM CHLORIDE 0.9 % IV BOLUS (SEPSIS)
1000.0000 mL | Freq: Once | INTRAVENOUS | Status: AC
  Administered 2016-02-24: 1000 mL via INTRAVENOUS

## 2016-02-24 MED ORDER — CLONIDINE HCL 0.1 MG PO TABS
0.1000 mg | ORAL_TABLET | Freq: Once | ORAL | Status: AC
  Administered 2016-02-24: 0.1 mg via ORAL
  Filled 2016-02-24: qty 1

## 2016-02-24 MED ORDER — LORAZEPAM 2 MG PO TABS
ORAL_TABLET | ORAL | Status: AC
  Administered 2016-02-24: 2 mg via ORAL
  Filled 2016-02-24: qty 1

## 2016-02-24 NOTE — ED Notes (Signed)
Pt sates that he is addicted to pills; pt last took oxycodone last night. Unprescribed pills. Pt states he has sweats in AM upon awakening, chills. Pt eating and drinking upon arrival to ER. Denies SI, denies HI. No hx of going through detox.

## 2016-02-24 NOTE — ED Notes (Signed)
Pt CBG at home 180.

## 2016-02-24 NOTE — Progress Notes (Signed)
Report received from April, RN.  

## 2016-02-24 NOTE — ED Notes (Signed)
Pt presents stating that he is addicted to percocet and he is very depressed. He states he began taking percocet 3 months ago for a dental problem and has been buying them on the streets. Pt desires "help with the pills" but did not affirm that he wants to quit taking the pills. Pt alert & oriented with NAD noted.

## 2016-02-24 NOTE — ED Notes (Signed)
Pt watching tv in no acute distress.  

## 2016-02-24 NOTE — BH Assessment (Signed)
Per [place] at [facility] they do have open beds.  Write rfaxed a referral to [phone].  Writer consulted with the ER MD regarding the referral.

## 2016-02-24 NOTE — Progress Notes (Signed)
Patient is currently sitting calmly in a chair in the dayroom speaking with a Engineer, materialssecurity officer.Will continue to monitor.

## 2016-02-24 NOTE — ED Notes (Signed)
Pt dressed out by male staff and security.

## 2016-02-24 NOTE — ED Notes (Signed)
Report to [place], rn

## 2016-02-24 NOTE — Progress Notes (Signed)
Patient entered BHU cursing and yelling upset that he has been admitted to unit. Patient began trying to break the glass with his fist to the nurse's station and attempted to pick up a chair in the dayroom. Patient began yelling and cursing at staff while yelling "get me the f**ck out of here." Patient began hitting his fist against and throwing his shoulder against various doors in BHU. Dr. Derrill KayGoodman and additional security called for safety measures.Will continue to monitor for safety.

## 2016-02-24 NOTE — ED Notes (Signed)
Pt to bhu before 2300 blood sugar obtained, report to cynthia, rn. Aram BeechamCynthia, rn ackowledges need for fsbs when pt arrives to bhu.

## 2016-02-24 NOTE — ED Notes (Signed)
Security and police moving pt to bhu at this time.

## 2016-02-24 NOTE — ED Notes (Signed)
TTS speaking with pt.  

## 2016-02-24 NOTE — ED Provider Notes (Addendum)
[facility] Emergency Department Provider Note  Time seen: 6:04 PM  I have reviewed the triage vital signs and the nursing notes.   HISTORY  Chief Complaint Addiction Problem    HPI Mark GONGAWARE is a 30 y.o. male with a past medical history of diabetes, presents the emergency department hoping to detox off opioids. According to the patient he has been using Percocet several times a day for the past 3 months. He has been buying them off the street. He states he last used Percocet last night. He really wishes to detox but states he feels like his heart is racing, he is having abdominal cramping, was hoping we could help him with detox. He has never tried detoxing in the past. Denies alcohol or other drug use. Denies any medical complaints currently besides a fast heart rate. Patient's last use was last night.     Past Medical History  Diagnosis Date  . Diabetes mellitus without complication (HCC)   . Hydrocephalus     Patient Active Problem List   Diagnosis Date Noted  . DKA (diabetic ketoacidoses) (HCC) 11/24/2015  . Diabetic keto-acidosis (HCC) 10/23/2015    Past Surgical History  Procedure Laterality Date  . Kidney stone surgery      Current Outpatient Rx  Name  Route  Sig  Dispense  Refill  . insulin aspart protamine- aspart (NOVOLOG MIX 70/30) (70-30) 100 UNIT/ML injection   Subcutaneous   Inject 0.35 mLs (35 Units total) into the skin 2 (two) times daily with a meal.   10 mL   11   . nicotine (NICODERM CQ - DOSED IN MG/24 HOURS) 21 mg/24hr patch   Transdermal   Place 1 patch (21 mg total) onto the skin daily. Patient not taking: Reported on 11/26/2015   28 patch   0     Allergies Penicillins; Sulfa antibiotics; and Clindamycin/lincomycin  Family History  Problem Relation Age of Onset  . Breast cancer Mother     Social History Social History  Substance Use Topics  . Smoking status: Current Every Day Smoker -- 1.00 packs/day     Types: Cigarettes  . Smokeless tobacco: None  . Alcohol Use: No     Comment: ocasional     Review of Systems Constitutional: Negative for fever. Cardiovascular: Negative for chest pain.Fast heart rate. Respiratory: Negative for shortness of breath. Gastrointestinal: Mild abdominal cramping. Positive for nausea. Negative for vomiting or diarrhea. Genitourinary: Negative for dysuria. Musculoskeletal: Negative for back pain Neurological: Negative for headache 10-point ROS otherwise negative.  ____________________________________________   PHYSICAL EXAM:  VITAL SIGNS: ED Triage Vitals  Enc Vitals Group     BP 02/24/16 1639 141/96 mmHg     Pulse Rate 02/24/16 1639 111     Resp 02/24/16 1639 18     Temp 02/24/16 1639 99 F (37.2 C)     Temp Source 02/24/16 1639 Oral     SpO2 02/24/16 1639 97 %     Weight 02/24/16 1639 240 lb (108.863 kg)     Height 02/24/16 1639  (1.88 m)     Head Cir --      Peak Flow --      Pain Score 02/24/16 1640 8     Pain Loc --      Pain Edu? --      Excl. in GC? --     Constitutional: Alert and oriented. Well appearing and in no distress. Eyes: Normal exam ENT   Head:  Normocephalic and atraumatic   Mouth/Throat: Mucous membranes are moist. Cardiovascular: Normal rate, regular rhythm. No murmur Respiratory: Normal respiratory effort without tachypnea nor retractions. Breath sounds are clear  Gastrointestinal: Soft and nontender. No distention.  Musculoskeletal: Nontender with normal range of motion in all extremities. Neurologic:  Normal speech and language. No gross focal neurologic deficits  Skin:  Skin is warm, dry and intact.  Psychiatric: Mood and affect are normal. Denies SI or HI. Does state mild depression.  ____________________________________________    INITIAL IMPRESSION / ASSESSMENT AND PLAN / ED COURSE  Pertinent labs & imaging results that were available during my care of the patient were reviewed by me and  considered in my medical decision making (see chart for details).  Patient presents the emergency department with opioid withdrawal hoping to detox. Patient overall appears well mild tachycardia around 100-110 bpm. No abdominal tenderness. Patient's last opioid use was last night. We will dose Phenergan and clonidine in the emergency department. We will discuss with PTT has for possible [facility] treatment. Patient denies any SI or HI.  Asians blood glucose has resulted significantly elevated. Patient states she has not used any insulin since last night for unknown reasons. We will check a VBG, begin IV hydration, and administer insulin.  VBG pH is within normal limits along with a normal anion gap. No signs of DKA at this time. We will continue IV fluids and insulin to decrease the patient's blood glucose. I discussed with the behavioral health nurse, they will work on placing the patient at [facility] if possible.  [facility] is not able to accept the patient as he does not currently have insulin with him or her glucometer. Patient states he lost his insulin 2 days ago and does not know where it is. I discussed with the patient I can prescribe him more insulin but he will need to get it filled on his own as we cannot provide him with insulin from the emergency department. Patient then states he's going to go home and kill himself, by hanging himself. At this point I believe the patient to be medically clear. We will involuntarily commit him to the hospital to be evaluated by psychiatry.   Patient became upset when we moved him to the behavioral health unit. Stating he wanted to get [facility] however no one was able to obtain the patient's insulin or glucometer for the patient. Given the patient suicidal ideation expressed to myself I placed the patient under involuntary commitment. Patient now states he did not mean that he had no other options as he is homeless. Patient states he is sorry he really needs help with his opioid  abuse. Given these conflicting statements and discussed with the patient having psychiatry see him tomorrow, we will keep him under an involuntary commitment until he can see evaluated by psychiatry, they feel the patient could be treated by [facility] at that time then he will arrange someone to get his insulin and glucometer. Patient is agreeable, I discuss he needs to remain calm and cooperative in the emergency department, the patient is agreeable to this as well.   ----------------------------------------- 12:08 AM on 02/25/2016 -----------------------------------------  Patient now states he does not wish to stay in the emergency department. Denies any suicidal intent or ideation. States he just said that because he is homeless and did not want to be discharged. He has now reconsidered and states he wants to be discharged she does not want to stay in the emergency department overnight. We  will resend the patient's ABC as he is calm, cooperative currently denies any SI or HI and states he just said that so we wouldn't discharge him, but now he wishes to be discharged. At this time I believe the patient is medically cleared for discharge. I discussed with the patient he can follow-up with [facility] once he is able to obtain his insulin and glucometer which she states are currently with an acquaintance ane he is going to try to go get it tonight. I offered to prescribe the patient insulin, he states he already has a he just needs to go get it. I'm discharging the patient from the emergency department this time.  ____________________________________________   FINAL CLINICAL IMPRESSION(S) / ED DIAGNOSES  Opioid withdrawal Opioid use disorder Hyperglycemia Suicidal ideation  Minna AntisKevin Ayah Cozzolino, MD 02/24/16 [phone]  Minna AntisKevin Tulani Kidney, MD 02/24/16 2358  Minna AntisKevin Cheetara Hoge, MD 02/25/16 [id number]

## 2016-02-24 NOTE — ED Notes (Signed)
Pt ringing call bell approx every 2-5 minutes for small requests.

## 2016-02-25 NOTE — Discharge Instructions (Signed)
You have been seen in the emergency department today for an elderly blood sugar and it opioid use disorder. As we have discussed please call the number provided for [facility] once you're able to obtain your insulin a glucometer and they may be able to help you. Return to the emergency department for any acute medical concerns.   Hyperglycemia High blood sugar (hyperglycemia) means that the level of sugar in your blood is higher than it should be. Signs of high blood sugar include:  Feeling thirsty.  Frequent peeing (urinating).  Feeling tired or sleepy.  Dry mouth.  Vision changes.  Feeling weak.  Feeling hungry but losing weight.  Numbness and tingling in your hands or feet.  Headache. When you ignore these signs, your blood sugar may keep going up. These problems may get worse, and other problems may begin. HOME CARE  Check your blood sugars as told by your doctor. Write down the numbers with the date and time.  Take the right amount of insulin or diabetes pills at the right time. Write down the dose with date and time.  Refill your insulin or diabetes pills before running out.  Watch what you eat. Follow your meal plan.  Drink liquids without sugar, such as water. Check with your doctor if you have kidney or heart disease.  Follow your doctor's orders for exercise. Exercise at the same time of day.  Keep your doctor's appointments. GET HELP RIGHT AWAY IF:   You have trouble thinking or are confused.  You have fast breathing with fruity smelling breath.  You pass out (faint).  You have 2 to 3 days of high blood sugars and you do not know why.  You have chest pain.  You are feeling sick to your stomach (nauseous) or throwing up (vomiting).  You have sudden vision changes. MAKE SURE YOU:   Understand these instructions.  Will watch your condition.  Will get help right away if you are not doing well or get worse.   This information is not intended to replace  advice given to you by your health care provider. Make sure you discuss any questions you have with your health care provider.   Document Released: 08/05/2009 Document Revised: 10/29/2014 Document Reviewed: 06/14/2015 Elsevier Interactive Patient Education 2016 Elsevier Inc.  Opioid Use Disorder Opioid use disorder is a mental disorder. It is the continued nonmedical use of opioids in spite of risks to health and well-being. Misused opioids include the street drug heroin. They also include pain medicines such as morphine, hydrocodone, oxycodone, and fentanyl. Opioids are very addictive. People who misuse opioids get an exaggerated feeling of well-being. Opioid use disorder often disrupts activities at home, work, or school. It may cause mental or physical problems.  A family history of opioid use disorder puts you at higher risk of it. People with opioid use disorder often misuse other drugs or have mental illness such as depression, posttraumatic stress disorder, or antisocial personality disorder. They also are at risk of suicide and death from overdose. SIGNS AND SYMPTOMS  Signs and symptoms of opioid use disorder include:  Use of opioids in larger amounts or over a longer period than intended.  Unsuccessful attempts to cut down or control opioid use.  A lot of time spent obtaining, using, or recovering from the effects of opioids.  A strong desire or urge to use opioids (craving).  Continued use of opioids in spite of major problems at work, school, or home because of use.  Continued  use of opioids in spite of relationship problems because of use.  Giving up or cutting down on important life activities because of opioid use.  Use of opioids over and over in situations when it is physically hazardous, such as driving a car.  Continued use of opioids in spite of a physical problem that is likely related to use. Physical problems can include:  Severe constipation.  Poor  nutrition.  Infertility.  Tuberculosis.  Aspiration pneumonia.  Infections such as human immunodeficiency virus (HIV) and hepatitis (from injecting opioids).  Continued use of opioids in spite of a mental problem that is likely related to use. Mental problems can include:  Depression.  Anxiety.  Hallucinations.  Sleep problems.  Loss of sexual function.  Need to use more and more opioids to get the same effect, or lessened effect over time with use of the same amount (tolerance).  Having withdrawal symptoms when opioid use is stopped, or using opioids to reduce or avoid withdrawal symptoms. Withdrawal symptoms include:  Depressed, anxious, or irritable mood.  Nausea, vomiting, diarrhea, or intestinal cramping.  Muscle aches or spasms.  Excessive tearing or runny nose.  Dilated pupils, sweating, or hairs standing on end.  Yawning.  Fever, raised blood pressure, or fast pulse.  Restlessness or trouble sleeping. This does not apply to people taking opioids for medical reasons only. DIAGNOSIS Opioid use disorder is diagnosed by your health care provider. You may be asked questions about your opioid use and and how it affects your life. A physical exam may be done. A drug screen may be ordered. You may be referred to a mental health professional. The diagnosis of opioid use disorder requires at least two symptoms within 12 months. The type of opioid use disorder you have depends on the number of signs and symptoms you have. The type may be:  Mild. Two or three signs and symptoms.   Moderate. Four or five signs and symptoms.   Severe. Six or more signs and symptoms. TREATMENT  Treatment is usually provided by mental health professionals with training in substance use disorders.The following options are available:  Detoxification.This is the first step in treatment for withdrawal. It is medically supervised withdrawal with the use of medicines. These medicines lessen  withdrawal symptoms. They also raise the chance of becoming opioid free.  Counseling, also known as talk therapy. Talk therapy addresses the reasons you use opioids. It also addresses ways to keep you from using again (relapse). The goals of talk therapy are to avoid relapse by:  Identifying and avoiding triggers for use.  Finding healthy ways to cope with stress.  Learning how to handle cravings.  Support groups. Support groups provide emotional support, advice, and guidance.  A medicine that blocks opioid receptors in your brain. This medicine can reduce opioid cravings that lead to relapse. This medicine also blocks the desired opioid effect when relapse occurs.  Opioids that are taken by mouth in place of the misused opioid (opioid maintenance treatment). These medicines satisfy cravings but are safer than commonly misused opioids. This often is the best option for people who continue to relapse with other treatments. HOME CARE INSTRUCTIONS   Take medicines only as directed by your health care provider.  Check with your health care provider before starting new medicines.  Keep all follow-up visits as directed by your health care provider. SEEK MEDICAL CARE IF:  You are not able to take your medicines as directed.  Your symptoms get worse. SEEK IMMEDIATE MEDICAL  CARE IF:  You have serious thoughts about hurting yourself or others.  You may have taken an overdose of opioids. FOR MORE INFORMATION  National Institute on Drug Abuse: http://www.price-smith.com/  Substance Abuse and Mental Health Services Administration: SkateOasis.com.pt   This information is not intended to replace advice given to you by your health care provider. Make sure you discuss any questions you have with your health care provider.   Document Released: 08/05/2007 Document Revised: 10/29/2014 Document Reviewed: 10/21/2013 Elsevier Interactive Patient Education [employer]

## 2016-02-25 NOTE — Progress Notes (Signed)
Patient requesting to be discharged, MD notified.

## 2016-02-25 NOTE — Progress Notes (Signed)
Discharge instructions reviewed. Patient denies SI, questions or concerns at this time.Patient escorted with belongings to lobby and discharged in stable condition.

## 2016-11-11 ENCOUNTER — Encounter: Payer: Self-pay | Admitting: Emergency Medicine

## 2016-11-11 ENCOUNTER — Emergency Department
Admission: EM | Admit: 2016-11-11 | Discharge: 2016-11-11 | Disposition: A | Discharge status: Home / Self Care | Payer: Self-pay | Attending: Emergency Medicine | Admitting: Emergency Medicine

## 2016-11-11 DIAGNOSIS — E1165 Type 2 diabetes mellitus with hyperglycemia: Secondary | ICD-10-CM | POA: Insufficient documentation

## 2016-11-11 DIAGNOSIS — Z794 Long term (current) use of insulin: Secondary | ICD-10-CM | POA: Insufficient documentation

## 2016-11-11 DIAGNOSIS — Z79899 Other long term (current) drug therapy: Secondary | ICD-10-CM | POA: Insufficient documentation

## 2016-11-11 DIAGNOSIS — F17210 Nicotine dependence, cigarettes, uncomplicated: Secondary | ICD-10-CM | POA: Insufficient documentation

## 2016-11-11 DIAGNOSIS — R739 Hyperglycemia, unspecified: Secondary | ICD-10-CM

## 2016-11-11 LAB — BASIC METABOLIC PANEL
ANION GAP: 7 (ref 5–15)
BUN: 12 mg/dL (ref 6–20)
CALCIUM: 9.4 mg/dL (ref 8.9–10.3)
CO2: 27 mmol/L (ref 22–32)
Chloride: 100 mmol/L — ABNORMAL LOW (ref 101–111)
Creatinine, Ser: 1 mg/dL (ref 0.61–1.24)
GFR calc Af Amer: >60 mL/min (ref >60)
GLUCOSE: 437 mg/dL — AB (ref 65–99)
POTASSIUM: 3.9 mmol/L (ref 3.5–5.1)
SODIUM: 134 mmol/L — AB (ref 135–145)

## 2016-11-11 LAB — BLOOD GAS, VENOUS
Acid-Base Excess: 2.7 mmol/L — ABNORMAL HIGH (ref 0.0–2.0)
Bicarbonate: 27.9 mmol/L (ref 20.0–28.0)
O2 SAT: 81.2 %
PCO2 VEN: 44 mmHg (ref 44.0–60.0)
PH VEN: 7.41 (ref 7.250–7.430)
PO2 VEN: 45 mmHg (ref 32.0–45.0)
Patient temperature: 37

## 2016-11-11 LAB — CBC
HEMATOCRIT: 41.1 % (ref 40.0–52.0)
HEMOGLOBIN: 14.2 g/dL (ref 13.0–18.0)
MCH: 31.3 pg (ref 26.0–34.0)
MCHC: 34.6 g/dL (ref 32.0–36.0)
MCV: 90.4 fL (ref 80.0–100.0)
Platelets: 273 10*3/uL (ref 150–440)
RBC: 4.55 MIL/uL (ref 4.40–5.90)
RDW: 13.8 % (ref 11.5–14.5)
WBC: 10.8 10*3/uL — AB (ref 3.8–10.6)

## 2016-11-11 LAB — GLUCOSE, CAPILLARY
GLUCOSE-CAPILLARY: 289 mg/dL — AB (ref 65–99)
Glucose-Capillary: 381 mg/dL — ABNORMAL HIGH (ref 65–99)

## 2016-11-11 MED ORDER — POTASSIUM CHLORIDE CRYS ER 20 MEQ PO TBCR
20.0000 meq | EXTENDED_RELEASE_TABLET | Freq: Once | ORAL | Status: AC
  Administered 2016-11-11: 20 meq via ORAL
  Filled 2016-11-11: qty 1

## 2016-11-11 MED ORDER — ONDANSETRON HCL 4 MG/2ML IJ SOLN
4.0000 mg | Freq: Once | INTRAMUSCULAR | Status: AC
  Administered 2016-11-11: 4 mg via INTRAVENOUS
  Filled 2016-11-11: qty 2

## 2016-11-11 MED ORDER — SODIUM CHLORIDE 0.9 % IV BOLUS (SEPSIS)
1000.0000 mL | Freq: Once | INTRAVENOUS | Status: AC
  Administered 2016-11-11: 1000 mL via INTRAVENOUS

## 2016-11-11 MED ORDER — INSULIN ASPART 100 UNIT/ML ~~LOC~~ SOLN
5.0000 [IU] | Freq: Once | SUBCUTANEOUS | Status: AC
  Administered 2016-11-11: 5 [IU] via INTRAVENOUS
  Filled 2016-11-11: qty 5

## 2016-11-11 NOTE — ED Triage Notes (Signed)
[facility]. Per patient his meter read "high", EMS reports cbg of 492. Patient states that he has not missed any doses of insulin. Patient recently discharged from [facility] with DKA.

## 2016-11-11 NOTE — ED Provider Notes (Signed)
[facility] Emergency Department Provider Note  ____________________________________________   First MD Initiated Contact with Patient 11/11/16 1721     (approximate)  I have reviewed the triage vital signs and the nursing notes.   HISTORY  Chief Complaint Hyperglycemia   HPI Mark [name] is a 31 y.o. male with a history of diabetes and hydrocephalus who is presenting to the emergency department today with high blood sugar. He says that he took his blood sugar homeless shelter and it read high. EMS rated as 492 on their glucometer. The patient says that he has nausea but no other symptoms. Denies any pain. Says that he has been compliant with his insulin dosing and has not missed any doses. He also says that he has not eaten an excessive amount of sweet foods. He says that his sugar "just does this sometimes." was recently admitted to [facility] for DKA.   Past Medical History:  Diagnosis Date  . Diabetes mellitus without complication (HCC)   . Hydrocephalus     Patient Active Problem List   Diagnosis Date Noted  . DKA (diabetic ketoacidoses) (HCC) 11/24/2015  . Diabetic keto-acidosis (HCC) 10/23/2015    Past Surgical History:  Procedure Laterality Date  . KIDNEY STONE SURGERY      Prior to Admission medications   Medication Sig Start Date End Date Taking? Authorizing Provider  insulin aspart protamine- aspart (NOVOLOG MIX 70/30) (70-30) 100 UNIT/ML injection Inject 0.35 mLs (35 Units total) into the skin 2 (two) times daily with a meal. 11/25/15   Auburn Bilberry, MD  nicotine (NICODERM CQ - DOSED IN MG/24 HOURS) 21 mg/24hr patch Place 1 patch (21 mg total) onto the skin daily. Patient not taking: Reported on 11/26/2015 10/24/15   Adrian Saran, MD    Allergies Penicillins; Sulfa antibiotics; and Clindamycin/lincomycin  Family History  Problem Relation Age of Onset  . Breast cancer Mother     Social History Social History  Substance Use  Topics  . Smoking status: Current Every Day Smoker    Packs/day: 1.00    Types: Cigarettes  . Smokeless tobacco: Never Used  . Alcohol use No     Comment: ocasional     Review of Systems Constitutional: No fever/chills Eyes: No visual changes. ENT: No sore throat. Cardiovascular: Denies chest pain. Respiratory: Denies shortness of breath. Gastrointestinal: No abdominal pain.  no vomiting.  No diarrhea.  No constipation. Genitourinary: Negative for dysuria. Musculoskeletal: Negative for back pain. Skin: Negative for rash. Neurological: Negative for headaches, focal weakness or numbness.  10-point ROS otherwise negative.  ____________________________________________   PHYSICAL EXAM:  VITAL SIGNS: ED Triage Vitals  Enc Vitals Group     BP 11/11/16 1718 128/78     Pulse Rate 11/11/16 1718 84     Resp 11/11/16 1718 16     Temp 11/11/16 1718 98.6 F (37 C)     Temp Source 11/11/16 1718 Oral     SpO2 11/11/16 1718 97 %     Weight 11/11/16 1719 229 lb 9.6 oz (104.1 kg)     Height --      Head Circumference --      Peak Flow --      Pain Score 11/11/16 1719 5     Pain Loc --      Pain Edu? --      Excl. in GC? --     Constitutional: Alert and oriented. Well appearing and in no acute distress. Eyes: Conjunctivae are normal.  PERRL. EOMI. Head: Atraumatic. Nose: No congestion/rhinnorhea. Mouth/Throat: Mucous membranes are moist.   Neck: No stridor.   Cardiovascular: Normal rate, regular rhythm. Grossly normal heart sounds.  Good peripheral circulation. Respiratory: Normal respiratory effort.  No retractions. Lungs CTAB. Gastrointestinal: Soft with mild and diffuse ttp. No distention.  Musculoskeletal: No lower extremity tenderness nor edema.  No joint effusions. Neurologic:  Normal speech and language. No gross focal neurologic deficits are appreciated.  Skin:  Skin is warm, dry and intact. No rash noted. Psychiatric: Mood and affect are normal. Speech and behavior are  normal.  ____________________________________________   LABS (all labs ordered are listed, but only abnormal results are displayed)  Labs Reviewed  GLUCOSE, CAPILLARY - Abnormal; Notable for the following:       Result Value   Glucose-Capillary 381 (*)    All other components within normal limits  BASIC METABOLIC PANEL - Abnormal; Notable for the following:    Sodium 134 (*)    Chloride 100 (*)    Glucose, Bld 437 (*)    All other components within normal limits  CBC - Abnormal; Notable for the following:    WBC 10.8 (*)    All other components within normal limits  BLOOD GAS, VENOUS - Abnormal; Notable for the following:    Acid-Base Excess 2.7 (*)    All other components within normal limits  GLUCOSE, CAPILLARY - Abnormal; Notable for the following:    Glucose-Capillary 289 (*)    All other components within normal limits  URINALYSIS, COMPLETE (UACMP) WITH MICROSCOPIC  CBG MONITORING, ED  CBG MONITORING, ED   ____________________________________________  EKG   ____________________________________________  RADIOLOGY   ____________________________________________   PROCEDURES  Procedure(s) performed:   Procedures  Critical Care performed:   ____________________________________________   INITIAL IMPRESSION / ASSESSMENT AND PLAN / ED COURSE  Pertinent labs & imaging results that were available during my care of the patient were reviewed by me and considered in my medical decision making (see chart for details).  ----------------------------------------- 7:15 PM on 11/11/2016 -----------------------------------------  Patient's glucose down to 289. No signs of DKA on his lab work. Patient says that he is feeling improved. Says that his abdominal pain has been chronic and unchanged. White blood cell count is chronically elevated and actually lower than his last several draws here in the emergency department. He says that he has plenty of insulin/diabetes  medications at home. [facility] where he [facility] had management in the past. He is understandable plan for discharge and willing to comply.      ____________________________________________   FINAL CLINICAL IMPRESSION(S) / ED DIAGNOSES  Upper glycemia.    NEW MEDICATIONS STARTED DURING THIS VISIT:  New Prescriptions   No medications on file     Note:  This document was prepared using Dragon voice recognition software and may include unintentional dictation errors.    Myrna Blazeravid Matthew Jhon Mallozzi, MD 11/11/16 [phone]

## 2016-11-11 NOTE — ED Notes (Signed)
Pt. Needs ride to shelter.

## 2017-07-13 ENCOUNTER — Emergency Department: Payer: Self-pay

## 2017-07-13 ENCOUNTER — Emergency Department
Admission: EM | Admit: 2017-07-13 | Discharge: 2017-07-13 | Disposition: A | Discharge status: Home / Self Care | Payer: Self-pay | Attending: Emergency Medicine | Admitting: Emergency Medicine

## 2017-07-13 DIAGNOSIS — Z9114 Patient's other noncompliance with medication regimen: Secondary | ICD-10-CM | POA: Insufficient documentation

## 2017-07-13 DIAGNOSIS — E1165 Type 2 diabetes mellitus with hyperglycemia: Secondary | ICD-10-CM | POA: Insufficient documentation

## 2017-07-13 DIAGNOSIS — Z91148 Patient's other noncompliance with medication regimen for other reason: Secondary | ICD-10-CM

## 2017-07-13 DIAGNOSIS — R739 Hyperglycemia, unspecified: Secondary | ICD-10-CM

## 2017-07-13 DIAGNOSIS — N200 Calculus of kidney: Secondary | ICD-10-CM

## 2017-07-13 DIAGNOSIS — Z794 Long term (current) use of insulin: Secondary | ICD-10-CM | POA: Insufficient documentation

## 2017-07-13 DIAGNOSIS — F17210 Nicotine dependence, cigarettes, uncomplicated: Secondary | ICD-10-CM | POA: Insufficient documentation

## 2017-07-13 LAB — BLOOD GAS, VENOUS
Acid-base deficit: 1.5 mmol/L (ref 0.0–2.0)
BICARBONATE: 22.9 mmol/L (ref 20.0–28.0)
O2 SAT: 85.7 %
PCO2 VEN: 37 mmHg — AB (ref 44.0–60.0)
PO2 VEN: 51 mmHg — AB (ref 32.0–45.0)
Patient temperature: 37
pH, Ven: 7.4 (ref 7.250–7.430)

## 2017-07-13 LAB — BASIC METABOLIC PANEL
Anion gap: 12 (ref 5–15)
BUN: 24 mg/dL — AB (ref 6–20)
CALCIUM: 9 mg/dL (ref 8.9–10.3)
CO2: 23 mmol/L (ref 22–32)
CREATININE: 1.02 mg/dL (ref 0.61–1.24)
Chloride: 93 mmol/L — ABNORMAL LOW (ref 101–111)
GFR calc non Af Amer: >60 mL/min (ref >60)
GLUCOSE: 715 mg/dL — AB (ref 65–99)
Potassium: 5.8 mmol/L — ABNORMAL HIGH (ref 3.5–5.1)
Sodium: 128 mmol/L — ABNORMAL LOW (ref 135–145)

## 2017-07-13 LAB — URINALYSIS, COMPLETE (UACMP) WITH MICROSCOPIC
Bacteria, UA: NONE SEEN
Bilirubin Urine: NEGATIVE
Ketones, ur: 5 mg/dL — AB
Leukocytes, UA: NEGATIVE
Nitrite: NEGATIVE
Protein, ur: NEGATIVE mg/dL
SPECIFIC GRAVITY, URINE: 1.022 (ref 1.005–1.030)
SQUAMOUS EPITHELIAL / LPF: NONE SEEN
pH: 6 (ref 5.0–8.0)

## 2017-07-13 LAB — CBC
HCT: 41.3 % (ref 40.0–52.0)
Hemoglobin: 13.7 g/dL (ref 13.0–18.0)
MCH: 32.6 pg (ref 26.0–34.0)
MCHC: 33.2 g/dL (ref 32.0–36.0)
MCV: 98.2 fL (ref 80.0–100.0)
PLATELETS: 293 10*3/uL (ref 150–440)
RBC: 4.21 MIL/uL — AB (ref 4.40–5.90)
RDW: 14.4 % (ref 11.5–14.5)
WBC: 10.2 10*3/uL (ref 3.8–10.6)

## 2017-07-13 LAB — GLUCOSE, CAPILLARY
Glucose-Capillary: >600 mg/dL (ref 65–99)
Glucose-Capillary: 276 mg/dL — ABNORMAL HIGH (ref 65–99)

## 2017-07-13 LAB — BETA-HYDROXYBUTYRIC ACID: Beta-Hydroxybutyric Acid: 2.4 mmol/L — ABNORMAL HIGH (ref 0.05–0.27)

## 2017-07-13 MED ORDER — SODIUM CHLORIDE 0.9 % IV BOLUS (SEPSIS)
1000.0000 mL | Freq: Once | INTRAVENOUS | Status: AC
  Administered 2017-07-13: 1000 mL via INTRAVENOUS

## 2017-07-13 MED ORDER — INSULIN ASPART 100 UNIT/ML ~~LOC~~ SOLN
20.0000 [IU] | Freq: Once | SUBCUTANEOUS | Status: AC
  Administered 2017-07-13: 20 [IU] via SUBCUTANEOUS
  Filled 2017-07-13: qty 1

## 2017-07-13 MED ORDER — INSULIN ASPART PROT & ASPART (70-30 MIX) 100 UNIT/ML ~~LOC~~ SUSP: 35.0000 [IU] | Freq: Two times a day (BID) | SUBCUTANEOUS | 0 refills | Status: DC

## 2017-07-13 MED ORDER — HALOPERIDOL LACTATE 5 MG/ML IJ SOLN
2.5000 mg | Freq: Once | INTRAMUSCULAR | Status: AC
  Administered 2017-07-13: 2.5 mg via INTRAVENOUS
  Filled 2017-07-13: qty 1

## 2017-07-13 NOTE — ED Notes (Signed)
CGB is not WNL. RN, Celine Mans has been notified.

## 2017-07-13 NOTE — ED Provider Notes (Signed)
[facility] Emergency Department Provider Note  ____________________________________________   First MD Initiated Contact with Patient 07/13/17 1941     (approximate)  I have reviewed the triage vital signs and the nursing notes.   HISTORY  Chief Complaint Hyperglycemia and Hematuria   HPI Mark [name] is a 31 y.o. male who self presents to the emergency department with with generalized malaise and fatigue. The patient says he was at work tonight when he began to feel nauseated lightheaded and weak since she came to the emergency department. He has a long-standing history of type 2 diabetes for which she is insulin-dependent. He ran out of his insulin 2 days ago because he cannot afford it. He does report polyuria and polydipsia. His symptoms of being gradual onset and slowly progressive. He also reports hematuria with no dysuria. No back pain. No fevers or chills. No flank pain.   Past Medical History:  Diagnosis Date  . Diabetes mellitus without complication (HCC)   . Hydrocephalus     Patient Active Problem List   Diagnosis Date Noted  . DKA (diabetic ketoacidoses) (HCC) 11/24/2015  . Diabetic keto-acidosis (HCC) 10/23/2015    Past Surgical History:  Procedure Laterality Date  . KIDNEY STONE SURGERY      Prior to Admission medications   Medication Sig Start Date End Date Taking? Authorizing Provider  insulin aspart protamine- aspart (NOVOLOG MIX 70/30) (70-30) 100 UNIT/ML injection Inject 0.35 mLs (35 Units total) into the skin 2 (two) times daily with a meal. 07/13/17   Merrily Brittle, MD  nicotine (NICODERM CQ - DOSED IN MG/24 HOURS) 21 mg/24hr patch Place 1 patch (21 mg total) onto the skin daily. Patient not taking: Reported on 11/26/2015 10/24/15   Adrian Saran, MD    Allergies Penicillins; Sulfa antibiotics; and Clindamycin/lincomycin  Family History  Problem Relation Age of Onset  . Breast cancer Mother     Social History Social  History  Substance Use Topics  . Smoking status: Current Every Day Smoker    Packs/day: 1.00    Types: Cigarettes  . Smokeless tobacco: Never Used  . Alcohol use No     Comment: ocasional     Review of Systems Constitutional: No fever/chills Eyes: No visual changes. ENT: No sore throat. Cardiovascular: Denies chest pain. Respiratory: Denies shortness of breath. Gastrointestinal: No abdominal pain.  Positive nausea, no vomiting.  No diarrhea.  No constipation. Genitourinary: Positive for hematuria Musculoskeletal: Negative for back pain. Skin: Negative for rash. Neurological: Negative for headaches, focal weakness or numbness.   ____________________________________________   PHYSICAL EXAM:  VITAL SIGNS: ED Triage Vitals  Enc Vitals Group     BP 07/13/17 1931 (!) 144/68     Pulse Rate 07/13/17 1931 (!) 115     Resp 07/13/17 1931 20     Temp 07/13/17 1931 98.9 F (37.2 C)     Temp Source 07/13/17 1931 Oral     SpO2 07/13/17 1931 97 %     Weight 07/13/17 1932 240 lb (108.9 kg)     Height 07/13/17 1932  (1.88 m)     Head Circumference --      Peak Flow --      Pain Score 07/13/17 1931 7     Pain Loc --      Pain Edu? --      Excl. in GC? --     Constitutional: Alert and oriented 4 disheveled no ketones on his breath normal respiratory rate Eyes:  PERRL EOMI. Head: Atraumatic. Nose: No congestion/rhinnorhea. Mouth/Throat: No trismus Neck: No stridor.   Cardiovascular: Tachycardic rate, regular rhythm. Grossly normal heart sounds.  Good peripheral circulation. Respiratory: Normal respiratory effort.  No retractions. Lungs CTAB and moving good air Gastrointestinal: Soft nondistended nontender no rebound no guarding or peritonitis no McBurney's tenderness no costovertebral tenderness Musculoskeletal: No lower extremity edema   Neurologic:  Normal speech and language. No gross focal neurologic deficits are appreciated. Skin:  Skin is warm, dry and intact. No rash  noted. Psychiatric: Mood and affect are normal. Speech and behavior are normal.    ____________________________________________   DIFFERENTIAL includes but not limited to  Diabetic ketoacidosis, HHS, asymptomatic hyperglycemia, dehydration, kidney stone ____________________________________________   LABS (all labs ordered are listed, but only abnormal results are displayed)  Labs Reviewed  BASIC METABOLIC PANEL - Abnormal; Notable for the following:       Result Value   Sodium 128 (*)    Potassium 5.8 (*)    Chloride 93 (*)    Glucose, Bld 715 (*)    BUN 24 (*)    All other components within normal limits  CBC - Abnormal; Notable for the following:    RBC 4.21 (*)    All other components within normal limits  URINALYSIS, COMPLETE (UACMP) WITH MICROSCOPIC - Abnormal; Notable for the following:    Color, Urine STRAW (*)    APPearance CLEAR (*)    Glucose, UA >=500 (*)    Hgb urine dipstick LARGE (*)    Ketones, ur 5 (*)    All other components within normal limits  GLUCOSE, CAPILLARY - Abnormal; Notable for the following:    Glucose-Capillary >600 (*)    All other components within normal limits  BLOOD GAS, VENOUS - Abnormal; Notable for the following:    pCO2, Ven 37 (*)    pO2, Ven 51.0 (*)    All other components within normal limits  BETA-HYDROXYBUTYRIC ACID - Abnormal; Notable for the following:    Beta-Hydroxybutyric Acid 2.40 (*)    All other components within normal limits  GLUCOSE, CAPILLARY - Abnormal; Notable for the following:    Glucose-Capillary >600 (*)    All other components within normal limits  GLUCOSE, CAPILLARY - Abnormal; Notable for the following:    Glucose-Capillary 276 (*)    All other components within normal limits  CBG MONITORING, ED    Blood work reviewed and interpreted by me: Elevated blood glucose although normal anion gap and no signs of diabetic ketoacidosis. He does have hematuria although no signs of infection. The white blood  cells in his urine are likely secondary to a reactive process __________________________________________  EKG   ____________________________________________  RADIOLOGY  CT scan shows proximal 6 mm right-sided kidney stone obstruction ____________________________________________   PROCEDURES  Procedure(s) performed: no  Procedures  Critical Care performed: no  Observation: no ____________________________________________   INITIAL IMPRESSION / ASSESSMENT AND PLAN / ED COURSE  Pertinent labs & imaging results that were available during my care of the patient were reviewed by me and considered in my medical decision making (see chart for details).  The patient arrives with elevated blood glucose and tachycardic although no ketones on his breath and kussmal breathing. We'll start with 2 L of fluid and reevaluate. Regarding his hematuria he'll require a CT scan to evaluate for kidney stone.  The patient's blood glucose came back at 7:15 although with a normal anion gap. He is given 20 units of insulin subcutaneously we  will continue to monitor.  The patient's CT scan confirms a proximal right-sided kidney stone with no obstruction. I appreciate he does have some white blood cells in his urine however this is likely reactive as he is nitrite negative bacteria and she has no symptoms of pyelonephritis. His blood glucose is improved after fluids and insulin and I will refill his prescription as well as provided him information for low cost medications. He is discharged home in improved condition.      ____________________________________________   FINAL CLINICAL IMPRESSION(S) / ED DIAGNOSES  Final diagnoses:  Hyperglycemia  Noncompliance with medication regimen  Kidney stone      NEW MEDICATIONS STARTED DURING THIS VISIT:  Current Discharge Medication List       Note:  This document was prepared using Dragon voice recognition software and may include unintentional  dictation errors.     Merrily Brittle, MD 07/13/17 [phone]

## 2017-07-13 NOTE — ED Notes (Signed)
Patient was given an urinal per request. NAD. NS infusing without difficulty.

## 2017-07-13 NOTE — Discharge Instructions (Addendum)
Today your blood sugar was extremely high, however you were not in diabetic ketoacidosis. You also had a kidney stone, however it is not causing any issues with your kidney. The most important thing free to do is to follow-up with your primary care physician tomorrow for recheck. Please return to the emergency department for any concerns.  It was a pleasure to take care of you today, and thank you for coming to our emergency department.  If you have any questions or concerns before leaving please ask the nurse to grab me and I'm more than happy to go through your aftercare instructions again.  If you were prescribed any opioid pain medication today such as Norco, Vicodin, Percocet, morphine, hydrocodone, or oxycodone please make sure you do not drive when you are taking this medication as it can alter your ability to drive safely.  If you have any concerns once you are home that you are not improving or are in fact getting worse before you can make it to your follow-up appointment, please do not hesitate to call 911 and come back for further evaluation.  Merrily Brittle, MD  Results for orders placed or performed during the hospital encounter of 07/13/17  Basic metabolic panel  Result Value Ref Range   Sodium 128 (L) 135 - 145 mmol/L   Potassium 5.8 (H) 3.5 - 5.1 mmol/L   Chloride 93 (L) 101 - 111 mmol/L   CO2 23 22 - 32 mmol/L   Glucose, Bld 715 (HH) 65 - 99 mg/dL   BUN 24 (H) 6 - 20 mg/dL   Creatinine, Ser 5.40 0.61 - 1.24 mg/dL   Calcium 9.0 8.9 - 98.1 mg/dL   GFR calc non Af Amer >60 >60 mL/min   GFR calc Af Amer >60 >60 mL/min   Anion gap 12 5 - 15  CBC  Result Value Ref Range   WBC 10.2 3.8 - 10.6 K/uL   RBC 4.21 (L) 4.40 - 5.90 MIL/uL   Hemoglobin 13.7 13.0 - 18.0 g/dL   HCT 19.1 47.8 - 29.5 %   MCV 98.2 80.0 - 100.0 fL   MCH 32.6 26.0 - 34.0 pg   MCHC 33.2 32.0 - 36.0 g/dL   RDW 62.1 30.8 - 65.7 %   Platelets 293 150 - 440 K/uL  Urinalysis, Complete w Microscopic  Result  Value Ref Range   Color, Urine STRAW (A) YELLOW   APPearance CLEAR (A) CLEAR   Specific Gravity, Urine 1.022 1.005 - 1.030   pH 6.0 5.0 - 8.0   Glucose, UA >=500 (A) NEGATIVE mg/dL   Hgb urine dipstick LARGE (A) NEGATIVE   Bilirubin Urine NEGATIVE NEGATIVE   Ketones, ur 5 (A) NEGATIVE mg/dL   Protein, ur NEGATIVE NEGATIVE mg/dL   Nitrite NEGATIVE NEGATIVE   Leukocytes, UA NEGATIVE NEGATIVE   RBC / HPF 6-30 0 - 5 RBC/hpf   WBC, UA TOO NUMEROUS TO COUNT 0 - 5 WBC/hpf   Bacteria, UA NONE SEEN NONE SEEN   Squamous Epithelial / LPF NONE SEEN NONE SEEN  Glucose, capillary  Result Value Ref Range   Glucose-Capillary >600 (HH) 65 - 99 mg/dL  Blood gas, venous  Result Value Ref Range   pH, Ven 7.40 7.250 - 7.430   pCO2, Ven 37 (L) 44.0 - 60.0 mmHg   pO2, Ven 51.0 (H) 32.0 - 45.0 mmHg   Bicarbonate 22.9 20.0 - 28.0 mmol/L   Acid-base deficit 1.5 0.0 - 2.0 mmol/L   O2 Saturation 85.7 %  Patient temperature 37.0    Collection site LINE    Sample type VENOUS   Beta-hydroxybutyric acid  Result Value Ref Range   Beta-Hydroxybutyric Acid 2.40 (H) 0.05 - 0.27 mmol/L  Glucose, capillary  Result Value Ref Range   Glucose-Capillary >600 (HH) 65 - 99 mg/dL  Glucose, capillary  Result Value Ref Range   Glucose-Capillary 276 (H) 65 - 99 mg/dL   Ct Renal Stone Study  Result Date: 07/13/2017 CLINICAL DATA:  Right lower quadrant pain 1 week. EXAM: CT ABDOMEN AND PELVIS WITHOUT CONTRAST TECHNIQUE: Multidetector CT imaging of the abdomen and pelvis was performed following the standard protocol without IV contrast. COMPARISON:  10/09/2010 FINDINGS: Lower chest: No acute abnormality. Hepatobiliary: Within normal. Pancreas: Within normal. Spleen: Within normal. Adrenals/Urinary Tract: Adrenal glands are normal. Kidneys are normal in size with mild bilateral nephrolithiasis. No significant hydronephrosis. There is a 6- 7 mm stone over the right renal pelvis near the UPJ. Ureters and bladder are normal.  Stomach/Bowel: Stomach and small bowel are normal. Appendix is normal. Colon is normal. Vascular/Lymphatic: Within normal. Reproductive: Within normal. Other: No free fluid or focal inflammatory change. Musculoskeletal: Minimal degenerate change of the spine and hips. IMPRESSION: Mild bilateral nephrolithiasis. 6-7 mm stone over the right renal pelvis near the UPJ. No significant obstruction. No ureteral stones. Electronically Signed   By: Elberta Fortis M.D.   On: 07/13/2017 21:09

## 2017-07-13 NOTE — ED Triage Notes (Signed)
Patient reports he has been out of his insulin for 2 days. Pt POCT CBG > 600 in triage.   Patient c/o nausea, emesis, abdominal pain.    Patient also c/o hematuria. Pt reports he passed a kidney stone 1 1/2 weeks ago, reports hx of the same.

## 2017-07-13 NOTE — ED Notes (Signed)
Pt says he just walked from [employer] to ED; c/o hyperglycemia and hematuria; pt says he's out of his diabetes medication and test strips for some time and so he is unable to report any glucose readings at home; "I know it's high"; pt assisted to wheelchair upon arrival; talking in complete coherent sentences

## 2017-07-13 NOTE — ED Notes (Signed)
Patient was given another Diet Coke. Patient is alert and oriented. Patient states he doesn't have a ride home.

## 2017-07-13 NOTE — ED Notes (Signed)
Dr. Lamont Snowball aware of blood sugar of 715.

## 2017-07-14 NOTE — ED Notes (Addendum)
Reviewed d/c instructions, follow-up care, prescription. Pt given information on medication assistance programs. Pt verbalized understanding.

## 2017-08-18 ENCOUNTER — Inpatient Hospital Stay
Admission: EM | Admit: 2017-08-18 | Discharge: 2017-08-22 | DRG: 638 | Disposition: A | Discharge status: Other Acute Inpatient Hospital | Payer: Medicaid Other | Attending: Internal Medicine | Admitting: Internal Medicine

## 2017-08-18 ENCOUNTER — Encounter: Payer: Self-pay | Admitting: Emergency Medicine

## 2017-08-18 DIAGNOSIS — F17210 Nicotine dependence, cigarettes, uncomplicated: Secondary | ICD-10-CM | POA: Diagnosis present

## 2017-08-18 DIAGNOSIS — Z880 Allergy status to penicillin: Secondary | ICD-10-CM

## 2017-08-18 DIAGNOSIS — Z915 Personal history of self-harm: Secondary | ICD-10-CM

## 2017-08-18 DIAGNOSIS — R45851 Suicidal ideations: Secondary | ICD-10-CM | POA: Diagnosis present

## 2017-08-18 DIAGNOSIS — F1910 Other psychoactive substance abuse, uncomplicated: Secondary | ICD-10-CM | POA: Diagnosis present

## 2017-08-18 DIAGNOSIS — E1010 Type 1 diabetes mellitus with ketoacidosis without coma: Principal | ICD-10-CM | POA: Diagnosis present

## 2017-08-18 DIAGNOSIS — Z818 Family history of other mental and behavioral disorders: Secondary | ICD-10-CM

## 2017-08-18 DIAGNOSIS — F332 Major depressive disorder, recurrent severe without psychotic features: Secondary | ICD-10-CM | POA: Diagnosis present

## 2017-08-18 DIAGNOSIS — E860 Dehydration: Secondary | ICD-10-CM | POA: Diagnosis present

## 2017-08-18 DIAGNOSIS — F418 Other specified anxiety disorders: Secondary | ICD-10-CM | POA: Diagnosis present

## 2017-08-18 DIAGNOSIS — R112 Nausea with vomiting, unspecified: Secondary | ICD-10-CM

## 2017-08-18 DIAGNOSIS — E871 Hypo-osmolality and hyponatremia: Secondary | ICD-10-CM

## 2017-08-18 DIAGNOSIS — E10649 Type 1 diabetes mellitus with hypoglycemia without coma: Secondary | ICD-10-CM | POA: Diagnosis present

## 2017-08-18 DIAGNOSIS — D72829 Elevated white blood cell count, unspecified: Secondary | ICD-10-CM | POA: Diagnosis present

## 2017-08-18 DIAGNOSIS — Z590 Homelessness: Secondary | ICD-10-CM

## 2017-08-18 DIAGNOSIS — E876 Hypokalemia: Secondary | ICD-10-CM | POA: Diagnosis present

## 2017-08-18 DIAGNOSIS — Z9119 Patient's noncompliance with other medical treatment and regimen: Secondary | ICD-10-CM

## 2017-08-18 DIAGNOSIS — E1110 Type 2 diabetes mellitus with ketoacidosis without coma: Secondary | ICD-10-CM | POA: Diagnosis present

## 2017-08-18 DIAGNOSIS — F0390 Unspecified dementia without behavioral disturbance: Secondary | ICD-10-CM | POA: Diagnosis present

## 2017-08-18 DIAGNOSIS — Z882 Allergy status to sulfonamides status: Secondary | ICD-10-CM

## 2017-08-18 DIAGNOSIS — R109 Unspecified abdominal pain: Secondary | ICD-10-CM

## 2017-08-18 DIAGNOSIS — T383X6A Underdosing of insulin and oral hypoglycemic [antidiabetic] drugs, initial encounter: Secondary | ICD-10-CM | POA: Diagnosis present

## 2017-08-18 DIAGNOSIS — F333 Major depressive disorder, recurrent, severe with psychotic symptoms: Secondary | ICD-10-CM

## 2017-08-18 DIAGNOSIS — Z881 Allergy status to other antibiotic agents status: Secondary | ICD-10-CM

## 2017-08-18 LAB — COMPREHENSIVE METABOLIC PANEL
ALBUMIN: 4.2 g/dL (ref 3.5–5.0)
ALK PHOS: 173 U/L — AB (ref 38–126)
ALT: 110 U/L — ABNORMAL HIGH (ref 17–63)
AST: 98 U/L — ABNORMAL HIGH (ref 15–41)
Anion gap: 25 — ABNORMAL HIGH (ref 5–15)
BILIRUBIN TOTAL: 2.7 mg/dL — AB (ref 0.3–1.2)
BUN: 23 mg/dL — AB (ref 6–20)
CO2: 10 mmol/L — ABNORMAL LOW (ref 22–32)
Calcium: 8.9 mg/dL (ref 8.9–10.3)
Chloride: 90 mmol/L — ABNORMAL LOW (ref 101–111)
Creatinine, Ser: 1.4 mg/dL — ABNORMAL HIGH (ref 0.61–1.24)
GFR calc Af Amer: >60 mL/min (ref >60)
GFR calc non Af Amer: >60 mL/min (ref >60)
GLUCOSE: 922 mg/dL — AB (ref 65–99)
POTASSIUM: 4.7 mmol/L (ref 3.5–5.1)
Sodium: 125 mmol/L — ABNORMAL LOW (ref 135–145)
TOTAL PROTEIN: 7.4 g/dL (ref 6.5–8.1)

## 2017-08-18 LAB — URINE DRUG SCREEN, QUALITATIVE (ARMC ONLY)
AMPHETAMINES, UR SCREEN: NOT DETECTED
BENZODIAZEPINE, UR SCRN: NOT DETECTED
Barbiturates, Ur Screen: NOT DETECTED
CANNABINOID 50 NG, UR ~~LOC~~: POSITIVE — AB
Cocaine Metabolite,Ur ~~LOC~~: NOT DETECTED
MDMA (ECSTASY) UR SCREEN: NOT DETECTED
Methadone Scn, Ur: NOT DETECTED
OPIATE, UR SCREEN: NOT DETECTED
PHENCYCLIDINE (PCP) UR S: NOT DETECTED
Tricyclic, Ur Screen: NOT DETECTED

## 2017-08-18 LAB — BASIC METABOLIC PANEL
ANION GAP: 23 — AB (ref 5–15)
ANION GAP: 13 (ref 5–15)
BUN: 19 mg/dL (ref 6–20)
BUN: 14 mg/dL (ref 6–20)
CHLORIDE: 102 mmol/L (ref 101–111)
CHLORIDE: 101 mmol/L (ref 101–111)
CO2: 11 mmol/L — ABNORMAL LOW (ref 22–32)
CO2: 19 mmol/L — ABNORMAL LOW (ref 22–32)
Calcium: 8.9 mg/dL (ref 8.9–10.3)
Calcium: 8.7 mg/dL — ABNORMAL LOW (ref 8.9–10.3)
Creatinine, Ser: 1.38 mg/dL — ABNORMAL HIGH (ref 0.61–1.24)
Creatinine, Ser: 1.07 mg/dL (ref 0.61–1.24)
GFR calc Af Amer: >60 mL/min (ref >60)
GFR calc Af Amer: >60 mL/min (ref >60)
Glucose, Bld: 157 mg/dL — ABNORMAL HIGH (ref 65–99)
Glucose, Bld: 116 mg/dL — ABNORMAL HIGH (ref 65–99)
POTASSIUM: 4.6 mmol/L (ref 3.5–5.1)
POTASSIUM: 3.8 mmol/L (ref 3.5–5.1)
SODIUM: 136 mmol/L (ref 135–145)
SODIUM: 133 mmol/L — AB (ref 135–145)

## 2017-08-18 LAB — URINALYSIS, COMPLETE (UACMP) WITH MICROSCOPIC
BILIRUBIN URINE: NEGATIVE
Bacteria, UA: NONE SEEN
Ketones, ur: 80 mg/dL — AB
LEUKOCYTES UA: NEGATIVE
NITRITE: NEGATIVE
PROTEIN: NEGATIVE mg/dL
Specific Gravity, Urine: 1.022 (ref 1.005–1.030)
pH: 6 (ref 5.0–8.0)

## 2017-08-18 LAB — BLOOD GAS, VENOUS
Acid-base deficit: 16.1 mmol/L — ABNORMAL HIGH (ref 0.0–2.0)
Bicarbonate: 10.2 mmol/L — ABNORMAL LOW (ref 20.0–28.0)
FIO2: 0.21
O2 SAT: 92.5 %
PATIENT TEMPERATURE: 37
PO2 VEN: 80 mmHg — AB (ref 32.0–45.0)
pCO2, Ven: 26 mmHg — ABNORMAL LOW (ref 44.0–60.0)
pH, Ven: 7.2 — ABNORMAL LOW (ref 7.250–7.430)

## 2017-08-18 LAB — GLUCOSE, CAPILLARY
GLUCOSE-CAPILLARY: 122 mg/dL — AB (ref 65–99)
GLUCOSE-CAPILLARY: 187 mg/dL — AB (ref 65–99)
GLUCOSE-CAPILLARY: 247 mg/dL — AB (ref 65–99)
GLUCOSE-CAPILLARY: 391 mg/dL — AB (ref 65–99)
Glucose-Capillary: >600 mg/dL (ref 65–99)
Glucose-Capillary: >600 mg/dL (ref 65–99)
Glucose-Capillary: 187 mg/dL — ABNORMAL HIGH (ref 65–99)
Glucose-Capillary: 130 mg/dL — ABNORMAL HIGH (ref 65–99)

## 2017-08-18 LAB — CBC WITH DIFFERENTIAL/PLATELET
BASOS ABS: 0.1 10*3/uL (ref 0–0.1)
BASOS PCT: 0 %
EOS ABS: 0.1 10*3/uL (ref 0–0.7)
EOS PCT: 0 %
HCT: 45.4 % (ref 40.0–52.0)
HEMOGLOBIN: 14.3 g/dL (ref 13.0–18.0)
LYMPHS ABS: 2.5 10*3/uL (ref 1.0–3.6)
Lymphocytes Relative: 12 %
MCH: 32.9 pg (ref 26.0–34.0)
MCHC: 31.4 g/dL — ABNORMAL LOW (ref 32.0–36.0)
MCV: 104.7 fL — ABNORMAL HIGH (ref 80.0–100.0)
Monocytes Absolute: 0.7 10*3/uL (ref 0.2–1.0)
Monocytes Relative: 3 %
NEUTROS PCT: 85 %
Neutro Abs: 18.4 10*3/uL — ABNORMAL HIGH (ref 1.4–6.5)
PLATELETS: 427 10*3/uL (ref 150–440)
RBC: 4.34 MIL/uL — AB (ref 4.40–5.90)
RDW: 16.2 % — ABNORMAL HIGH (ref 11.5–14.5)
WBC: 21.7 10*3/uL — ABNORMAL HIGH (ref 3.8–10.6)

## 2017-08-18 LAB — PHOSPHORUS: PHOSPHORUS: 1.9 mg/dL — AB (ref 2.5–4.6)

## 2017-08-18 LAB — ETHANOL: Alcohol, Ethyl (B): <10 mg/dL (ref <10)

## 2017-08-18 LAB — MRSA PCR SCREENING: MRSA by PCR: NEGATIVE

## 2017-08-18 LAB — MAGNESIUM: MAGNESIUM: 1.8 mg/dL (ref 1.7–2.4)

## 2017-08-18 LAB — BETA-HYDROXYBUTYRIC ACID

## 2017-08-18 MED ORDER — ONDANSETRON HCL 4 MG PO TABS: 4.0000 mg | ORAL_TABLET | Freq: Four times a day (QID) | ORAL | Status: DC | PRN

## 2017-08-18 MED ORDER — NICOTINE 21 MG/24HR TD PT24
21.0000 mg | MEDICATED_PATCH | Freq: Every day | TRANSDERMAL | Status: DC
Start: 2017-08-18 — End: 2017-08-22
  Administered 2017-08-18 – 2017-08-22 (×5): 21 mg via TRANSDERMAL
  Filled 2017-08-18 (×6): qty 1

## 2017-08-18 MED ORDER — DOCUSATE SODIUM 100 MG PO CAPS
100.0000 mg | ORAL_CAPSULE | Freq: Two times a day (BID) | ORAL | Status: DC
  Administered 2017-08-18 – 2017-08-22 (×7): 100 mg via ORAL
  Filled 2017-08-18 (×8): qty 1

## 2017-08-18 MED ORDER — SODIUM CHLORIDE 0.9 % IV BOLUS (SEPSIS)
1000.0000 mL | Freq: Once | INTRAVENOUS | Status: AC
  Administered 2017-08-18: 1000 mL via INTRAVENOUS

## 2017-08-18 MED ORDER — ACETAMINOPHEN 650 MG RE SUPP: 650.0000 mg | Freq: Four times a day (QID) | RECTAL | Status: DC | PRN

## 2017-08-18 MED ORDER — ONDANSETRON HCL 4 MG/2ML IJ SOLN
4.0000 mg | Freq: Once | INTRAMUSCULAR | Status: AC
  Administered 2017-08-18: 4 mg via INTRAVENOUS

## 2017-08-18 MED ORDER — SODIUM CHLORIDE 0.9 % IV SOLN
INTRAVENOUS | Status: DC
  Administered 2017-08-18 – 2017-08-19 (×2): via INTRAVENOUS

## 2017-08-18 MED ORDER — HEPARIN SODIUM (PORCINE) 5000 UNIT/ML IJ SOLN
5000.0000 [IU] | Freq: Three times a day (TID) | INTRAMUSCULAR | Status: DC
  Administered 2017-08-18 – 2017-08-22 (×10): 5000 [IU] via SUBCUTANEOUS
  Filled 2017-08-18 (×11): qty 1

## 2017-08-18 MED ORDER — GABAPENTIN 300 MG PO CAPS
600.0000 mg | ORAL_CAPSULE | Freq: Three times a day (TID) | ORAL | Status: DC
  Administered 2017-08-18 – 2017-08-22 (×11): 600 mg via ORAL
  Filled 2017-08-18 (×11): qty 2

## 2017-08-18 MED ORDER — SODIUM CHLORIDE 0.9 % IV SOLN: INTRAVENOUS | Status: DC

## 2017-08-18 MED ORDER — ONDANSETRON HCL 4 MG/2ML IJ SOLN
INTRAMUSCULAR | Status: AC
  Administered 2017-08-18: 4 mg via INTRAVENOUS
  Filled 2017-08-18: qty 2

## 2017-08-18 MED ORDER — BISACODYL 10 MG RE SUPP: 10.0000 mg | Freq: Every day | RECTAL | Status: DC | PRN

## 2017-08-18 MED ORDER — ALUM & MAG HYDROXIDE-SIMETH 200-200-20 MG/5ML PO SUSP
30.0000 mL | ORAL | Status: DC | PRN
  Administered 2017-08-18: 30 mL via ORAL
  Filled 2017-08-18 (×2): qty 30

## 2017-08-18 MED ORDER — LORAZEPAM 2 MG/ML IJ SOLN: 0.5000 mg | INTRAMUSCULAR | Status: DC | PRN

## 2017-08-18 MED ORDER — DEXTROSE-NACL 5-0.45 % IV SOLN
INTRAVENOUS | Status: DC
  Administered 2017-08-18: 21:00:00 via INTRAVENOUS

## 2017-08-18 MED ORDER — PANTOPRAZOLE SODIUM 40 MG IV SOLR
40.0000 mg | Freq: Two times a day (BID) | INTRAVENOUS | Status: DC
  Administered 2017-08-18: 40 mg via INTRAVENOUS
  Filled 2017-08-18: qty 40

## 2017-08-18 MED ORDER — IPRATROPIUM-ALBUTEROL 0.5-2.5 (3) MG/3ML IN SOLN
3.0000 mL | Freq: Four times a day (QID) | RESPIRATORY_TRACT | Status: DC
  Administered 2017-08-18 – 2017-08-19 (×2): 3 mL via RESPIRATORY_TRACT
  Filled 2017-08-18 (×2): qty 3

## 2017-08-18 MED ORDER — SODIUM CHLORIDE 0.9 % IV SOLN
INTRAVENOUS | Status: DC
  Administered 2017-08-18: 5.4 [IU]/h via INTRAVENOUS
  Filled 2017-08-18: qty 1

## 2017-08-18 MED ORDER — ONDANSETRON HCL 4 MG/2ML IJ SOLN
4.0000 mg | Freq: Four times a day (QID) | INTRAMUSCULAR | Status: DC | PRN
  Administered 2017-08-18: 4 mg via INTRAVENOUS
  Filled 2017-08-18: qty 2

## 2017-08-18 MED ORDER — ACETAMINOPHEN 325 MG PO TABS
650.0000 mg | ORAL_TABLET | Freq: Four times a day (QID) | ORAL | Status: DC | PRN
  Administered 2017-08-18: 650 mg via ORAL
  Filled 2017-08-18: qty 2

## 2017-08-18 MED ORDER — DIPHENHYDRAMINE HCL 50 MG PO CAPS
50.0000 mg | ORAL_CAPSULE | Freq: Every evening | ORAL | Status: DC | PRN
  Administered 2017-08-18: 50 mg via ORAL
  Filled 2017-08-18 (×2): qty 1

## 2017-08-18 NOTE — ED Notes (Signed)
Patient give ice chips. Ok with Dr. Alphonzo LemmingsMcshane.

## 2017-08-18 NOTE — ED Notes (Signed)
After completing IV access patient kicked extra IV supplies off the stretcher and on to the ground. Patient states, "ops. You will have to pick that up".

## 2017-08-18 NOTE — ED Notes (Addendum)
Patient pacing in room. Patient yelling "I'm about to fucking leave". Patient verbally de-escalated by this RN. Patient notified and educated on plan of care. This RN states "I am going to start an IV". Patient goes into the bathroom and shuts the door. MD notified.

## 2017-08-18 NOTE — ED Provider Notes (Addendum)
[facility] Emergency Department Provider Note  ____________________________________________   I have reviewed the triage vital signs and the nursing notes.   HISTORY  Chief Complaint Hyperglycemia    HPI Mark [name] is a 31 y.o. male who presents today complaining of vomiting for 3 days he is a poorly compliant substance abuser with a history of type 1 diabetes who has been intermittently homeless he states he is now homeless at this time but he ran out of his is on 3 days ago has been vomiting ever since.  Denies fever or any focal pains.  Denies headache or stiff neck denies chest pain or shortness of breath, states he just out of his insulin and feels that he is in DKA.  No melena no bright red blood per rectum no hematemesis nothing makes symptoms better nothing makes his symptoms worse been going on for 2-3 days, no other associated symptoms     Past Medical History:  Diagnosis Date  . Diabetes mellitus without complication (HCC)   . Hydrocephalus     Patient Active Problem List   Diagnosis Date Noted  . DKA (diabetic ketoacidoses) (HCC) 11/24/2015  . Diabetic keto-acidosis (HCC) 10/23/2015    Past Surgical History:  Procedure Laterality Date  . KIDNEY STONE SURGERY      Prior to Admission medications   Medication Sig Start Date End Date Taking? Authorizing Provider  insulin aspart protamine- aspart (NOVOLOG MIX 70/30) (70-30) 100 UNIT/ML injection Inject 0.35 mLs (35 Units total) into the skin 2 (two) times daily with a meal. 07/13/17   Merrily Brittleifenbark, Neil, MD  nicotine (NICODERM CQ - DOSED IN MG/24 HOURS) 21 mg/24hr patch Place 1 patch (21 mg total) onto the skin daily. Patient not taking: Reported on 11/26/2015 10/24/15   Adrian SaranMody, Sital, MD    Allergies Penicillins; Sulfa antibiotics; and Clindamycin/lincomycin  Family History  Problem Relation Age of Onset  . Breast cancer Mother     Social History Social History  Substance Use Topics   . Smoking status: Current Every Day Smoker    Packs/day: 1.00    Types: Cigarettes  . Smokeless tobacco: Never Used  . Alcohol use No     Comment: ocasional     Review of Systems Constitutional: No fever/chills Eyes: No visual changes. ENT: No sore throat. No stiff neck no neck pain Cardiovascular: Denies chest pain. Respiratory: Denies shortness of breath. Gastrointestinal:   Positive vomiting.  No diarrhea.  No constipation. Genitourinary: Negative for dysuria. Musculoskeletal: Negative lower extremity swelling Skin: Negative for rash. Neurological: Negative for severe headaches, focal weakness or numbness.   ____________________________________________   PHYSICAL EXAM:  VITAL SIGNS: ED Triage Vitals  Enc Vitals Group     BP 08/18/17 1420 140/81     Pulse Rate 08/18/17 1420 (!) 111     Resp 08/18/17 1420 15     Temp 08/18/17 1420 98.2 F (36.8 C)     Temp Source 08/18/17 1420 Oral     SpO2 08/18/17 1420 98 %     Weight 08/18/17 1422 240 lb (108.9 kg)     Height 08/18/17 1422 6\' 2"  (1.88 m)     Head Circumference --      Peak Flow --      Pain Score 08/18/17 1420 7     Pain Loc --      Pain Edu? --      Excl. in GC? --     Constitutional: Alert and oriented. Well appearing and  in no acute distress. Eyes: Conjunctivae are normal Head: Atraumatic HEENT: No congestion/rhinnorhea. Mucous membranes are dry.  Oropharynx non-erythematous Neck:   Nontender with no meningismus, no masses, no stridor Cardiovascular: Mild tachycardia noted regular rhythm. Grossly normal heart sounds.  Good peripheral circulation. Respiratory: Normal respiratory effort.  No retractions. Lungs CTAB. Abdominal: Soft and nontender. No distention. No guarding no rebound Back:  There is no focal tenderness or step off.  there is no midline tenderness there are no lesions noted. there is no CVA tenderness Musculoskeletal: No lower extremity tenderness, no upper extremity tenderness. No joint  effusions, no DVT signs strong distal pulses no edema Neurologic:  Normal speech and language. No gross focal neurologic deficits are appreciated.  Skin:  Skin is warm, dry and intact. No rash noted. Psychiatric: Mood and affect are normal. Speech and behavior are normal.  ____________________________________________   LABS (all labs ordered are listed, but only abnormal results are displayed)  Labs Reviewed  BLOOD GAS, VENOUS - Abnormal; Notable for the following:       Result Value   pH, Ven 7.20 (*)    pCO2, Ven 26 (*)    pO2, Ven 80.0 (*)    Bicarbonate 10.2 (*)    Acid-base deficit 16.1 (*)    All other components within normal limits  CBC WITH DIFFERENTIAL/PLATELET - Abnormal; Notable for the following:    WBC 21.7 (*)    RBC 4.34 (*)    MCV 104.7 (*)    MCHC 31.4 (*)    RDW 16.2 (*)    Neutro Abs 18.4 (*)    All other components within normal limits  URINALYSIS, COMPLETE (UACMP) WITH MICROSCOPIC - Abnormal; Notable for the following:    Color, Urine COLORLESS (*)    APPearance CLEAR (*)    Glucose, UA >=500 (*)    Hgb urine dipstick MODERATE (*)    Ketones, ur 80 (*)    Squamous Epithelial / LPF 0-5 (*)    All other components within normal limits  COMPREHENSIVE METABOLIC PANEL  BETA-HYDROXYBUTYRIC ACID  URINE DRUG SCREEN, QUALITATIVE ([facility] ONLY)  ETHANOL    Pertinent labs  results that were available during my care of the patient were reviewed by me and considered in my medical decision making (see chart for details). ____________________________________________  EKG  I personally interpreted any EKGs ordered by me or triage  ____________________________________________  RADIOLOGY  Pertinent labs & imaging results that were available during my care of the patient were reviewed by me and considered in my medical decision making (see chart for details). If possible, patient and/or family made aware of any abnormal  findings. ____________________________________________    PROCEDURES  Procedure(s) performed: None  Procedures  Critical Care performed: CRITICAL CARE Performed by: Jeanmarie Plant   Total critical care time: 40 minutes  Critical care time was exclusive of separately billable procedures and treating other patients.  Critical care was necessary to treat or prevent imminent or life-threatening deterioration.  Critical care was time spent personally by me on the following activities: development of treatment plan with patient and/or surrogate as well as nursing, discussions with consultants, evaluation of patient's response to treatment, examination of patient, obtaining history from patient or surrogate, ordering and performing treatments and interventions, ordering and review of laboratory studies, ordering and review of radiographic studies, pulse oximetry and re-evaluation of patient's condition.   ____________________________________________   INITIAL IMPRESSION / ASSESSMENT AND PLAN / ED COURSE  Pertinent labs & imaging results that  were available during my care of the patient were reviewed by me and considered in my medical decision making (see chart for details).  Patient here with evidence of DKA vomiting for several days, persistent, all in the context of noncompliance with his insulin, again, has been admitted for DKA in the past.  Blood work shows mild to moderate acidemia on the ABG with a lower bicarb, white count elevated, no obvious source of infection this is most likely secondary to noncompliance, however would we will continue to observe him here.  No evidence of sepsis, very slight tachycardia.  Patient very poorly kempt, he states he is not homeless but he certainly does not appear to be taking good care of himself.  Unfortunately, he will require admission to the hospital for his DKA, perhaps social worker talk to him at that time     ____________________________________________   FINAL CLINICAL IMPRESSION(S) / ED DIAGNOSES  Final diagnoses:  None      This chart was dictated using voice recognition software.  Despite best efforts to proofread,  errors can occur which can change meaning.      Jeanmarie Plant, MD 08/18/17 1547    Jeanmarie Plant, MD 08/18/17 [phone]

## 2017-08-18 NOTE — ED Triage Notes (Signed)
Patient presents to ED via [facility] from home with c/o hyperglycemia. Patient is a type 1 diabetic, normally takes novolog at home. Patient has been out of his insulin for 3 days. Patient states, "I don't have transportation to get my medication". Patient vomiting in triage. EMS report CBG of 370.

## 2017-08-18 NOTE — H&P (Signed)
History and Physical    Mark [name] [id number] DOB: 09/14/1986 DOA: 08/18/2017  Referring physician: Dr. Alphonzo LemmingsMcShane PCP: Patient, No Pcp Per  Specialists: none  Chief Complaint: abdominal pain with N/V  HPI: Mark [name] is a 31 y.o. male has a past medical history significant for DM, non-compliance, and DKA here with abdominal pain and N/V. Sugar>900 in ER with acidosis. Has not been compliant with insulin at home. He is now admitted. No fever. Denies CP or SOB. No diarrhea. Has had poor appetite.  Review of Systems: The patient denies fever, weight loss,, vision loss, decreased hearing, hoarseness, chest pain, syncope, dyspnea on exertion, peripheral edema, balance deficits, hemoptysis, melena, hematochezia, severe indigestion/heartburn, hematuria, incontinence, genital sores, muscle weakness, suspicious skin lesions, transient blindness, difficulty walking, depression, unusual weight change, abnormal bleeding, enlarged lymph nodes, angioedema, and breast masses.   Past Medical History:  Diagnosis Date  . Diabetes mellitus without complication (HCC)   . Hydrocephalus    Past Surgical History:  Procedure Laterality Date  . KIDNEY STONE SURGERY     Social History:  reports that he has been smoking Cigarettes.  He has been smoking about 1.00 pack per day. He has never used smokeless tobacco. He reports that he does not drink alcohol or use drugs.  Allergies  Allergen Reactions  . Penicillins Anaphylaxis, Hives and Swelling    Has patient had a PCN reaction causing immediate rash, facial/tongue/throat swelling, SOB or lightheadedness with hypotension: Yes Has patient had a PCN reaction causing severe rash involving mucus membranes or skin necrosis: No Has patient had a PCN reaction that required hospitalization Yes Has patient had a PCN reaction occurring within the last 10 years: Yes If all of the above answers are "NO", then may proceed with Cephalosporin use.  . Sulfa  Antibiotics Anaphylaxis and Hives  . Clindamycin/Lincomycin Hives    Family History  Problem Relation Age of Onset  . Breast cancer Mother     Prior to Admission medications   Medication Sig Start Date End Date Taking? Authorizing Provider  insulin glargine (LANTUS) 100 UNIT/ML injection Inject 50 Units into the skin at bedtime.    Yes [provider]  insulin aspart protamine- aspart (NOVOLOG MIX 70/30) (70-30) 100 UNIT/ML injection Inject 0.35 mLs (35 Units total) into the skin 2 (two) times daily with a meal. 07/13/17   Merrily Brittleifenbark, Neil, MD  nicotine (NICODERM CQ - DOSED IN MG/24 HOURS) 21 mg/24hr patch Place 1 patch (21 mg total) onto the skin daily. Patient not taking: Reported on 11/26/2015 10/24/15   Adrian SaranMody, Sital, MD   Physical Exam: Vitals:   08/18/17 1420 08/18/17 1422 08/18/17 1539  BP: 140/81  (!) 108/58  Pulse: (!) 111  (!) 106  Resp: 15  17  Temp: 98.2 F (36.8 C)    TempSrc: Oral    SpO2: 98%  100%  Weight:  108.9 kg (240 lb)   Height:  6\' 2"  (1.88 m)      General:  No apparent distress, WDWN, [place]/AT  Eyes: PERRL, EOMI, no scleral icterus, conjunctiva clear  ENT:  Dry oropharynx without exudate, TM's benign, dentition fair  Neck: supple, no lymphadenopathy. No bruits or thyromegaly  Cardiovascular: rapid rate regular rhythm without MRG; 2+ peripheral pulses, no JVD, no peripheral edema  Respiratory: CTA biL, good air movement without wheezing, rhonchi or crackled. Respiratory effort normal  Abdomen: soft, non tender to palpation, positive bowel sounds, no guarding, no rebound  Skin: no rashes or lesions  Musculoskeletal: normal bulk and tone, no joint swelling  Psychiatric: normal mood and affect, A&OX3  Neurologic: CN 2-12 grossly intact, Motor strength 5/5 in all 4 groups with symmetric DTR's and non-focal sensory exam  Labs on Admission:  Basic Metabolic Panel:  Recent Labs Lab 08/18/17 1454  NA 125*  K 4.7  CL 90*  CO2 10*  GLUCOSE  922*  BUN 23*  CREATININE 1.40*  CALCIUM 8.9   Liver Function Tests:  Recent Labs Lab 08/18/17 1454  AST 98*  ALT 110*  ALKPHOS 173*  BILITOT 2.7*  PROT 7.4  ALBUMIN 4.2   No results for input(s): LIPASE, AMYLASE in the last 168 hours. No results for input(s): AMMONIA in the last 168 hours. CBC:  Recent Labs Lab 08/18/17 1454  WBC 21.7*  NEUTROABS 18.4*  HGB 14.3  HCT 45.4  MCV 104.7*  PLT 427   Cardiac Enzymes: No results for input(s): CKTOTAL, CKMB, CKMBINDEX, TROPONINI in the last 168 hours.  BNP (last 3 results) No results for input(s): BNP in the last 8760 hours.  ProBNP (last 3 results) No results for input(s): PROBNP in the last 8760 hours.  CBG:  Recent Labs Lab 08/18/17 1546  GLUCAP >600*    Radiological Exams on Admission: No results found.  EKG: Independently reviewed.  Assessment/Plan Principal Problem:   DKA (diabetic ketoacidoses) (HCC) Active Problems:   Nausea & vomiting   Abdominal pain   Hyponatremia   Will admit to Stepdown on insulin drip and follow sugars hourly. Begin IV NS. Consult Endocrinology. Repeat labs in AM.  Diet: low carb Fluids: NS@125  DVT Prophylaxis: SQ Heparin  Code Status: FULL  Family Communication: none  Disposition Plan: home  Time spent: 50 min

## 2017-08-19 DIAGNOSIS — F333 Major depressive disorder, recurrent, severe with psychotic symptoms: Secondary | ICD-10-CM

## 2017-08-19 DIAGNOSIS — F332 Major depressive disorder, recurrent severe without psychotic features: Secondary | ICD-10-CM

## 2017-08-19 LAB — COMPREHENSIVE METABOLIC PANEL
ALT: 78 U/L — ABNORMAL HIGH (ref 17–63)
AST: 68 U/L — AB (ref 15–41)
Albumin: 3.1 g/dL — ABNORMAL LOW (ref 3.5–5.0)
Alkaline Phosphatase: 123 U/L (ref 38–126)
Anion gap: 11 (ref 5–15)
BUN: 10 mg/dL (ref 6–20)
CHLORIDE: 102 mmol/L (ref 101–111)
CO2: 21 mmol/L — AB (ref 22–32)
Calcium: 8.3 mg/dL — ABNORMAL LOW (ref 8.9–10.3)
Creatinine, Ser: 0.88 mg/dL (ref 0.61–1.24)
Glucose, Bld: 106 mg/dL — ABNORMAL HIGH (ref 65–99)
POTASSIUM: 3.4 mmol/L — AB (ref 3.5–5.1)
SODIUM: 134 mmol/L — AB (ref 135–145)
Total Bilirubin: 0.9 mg/dL (ref 0.3–1.2)
Total Protein: 5.8 g/dL — ABNORMAL LOW (ref 6.5–8.1)

## 2017-08-19 LAB — GLUCOSE, CAPILLARY
GLUCOSE-CAPILLARY: 102 mg/dL — AB (ref 65–99)
GLUCOSE-CAPILLARY: 106 mg/dL — AB (ref 65–99)
GLUCOSE-CAPILLARY: 212 mg/dL — AB (ref 65–99)
Glucose-Capillary: 137 mg/dL — ABNORMAL HIGH (ref 65–99)
Glucose-Capillary: 151 mg/dL — ABNORMAL HIGH (ref 65–99)
Glucose-Capillary: 237 mg/dL — ABNORMAL HIGH (ref 65–99)
Glucose-Capillary: 250 mg/dL — ABNORMAL HIGH (ref 65–99)
Glucose-Capillary: 79 mg/dL (ref 65–99)

## 2017-08-19 LAB — CBC
HEMATOCRIT: 34.1 % — AB (ref 40.0–52.0)
Hemoglobin: 12.1 g/dL — ABNORMAL LOW (ref 13.0–18.0)
MCH: 34.1 pg — ABNORMAL HIGH (ref 26.0–34.0)
MCHC: 35.6 g/dL (ref 32.0–36.0)
MCV: 95.9 fL (ref 80.0–100.0)
Platelets: 266 10*3/uL (ref 150–440)
RBC: 3.56 MIL/uL — AB (ref 4.40–5.90)
RDW: 14.9 % — ABNORMAL HIGH (ref 11.5–14.5)
WBC: 15 10*3/uL — AB (ref 3.8–10.6)

## 2017-08-19 MED ORDER — INSULIN ASPART 100 UNIT/ML ~~LOC~~ SOLN
0.0000 [IU] | Freq: Three times a day (TID) | SUBCUTANEOUS | Status: DC
  Administered 2017-08-19 (×2): 3 [IU] via SUBCUTANEOUS
  Administered 2017-08-20 – 2017-08-21 (×2): 9 [IU] via SUBCUTANEOUS
  Administered 2017-08-21: 3 [IU] via SUBCUTANEOUS
  Administered 2017-08-22: 2 [IU] via SUBCUTANEOUS
  Administered 2017-08-22: 1 [IU] via SUBCUTANEOUS
  Filled 2017-08-19 (×7): qty 1

## 2017-08-19 MED ORDER — INSULIN ASPART 100 UNIT/ML ~~LOC~~ SOLN
0.0000 [IU] | Freq: Every day | SUBCUTANEOUS | Status: DC
  Administered 2017-08-21: 3 [IU] via SUBCUTANEOUS
  Filled 2017-08-19: qty 1

## 2017-08-19 MED ORDER — IPRATROPIUM-ALBUTEROL 0.5-2.5 (3) MG/3ML IN SOLN: 3.0000 mL | RESPIRATORY_TRACT | Status: DC | PRN

## 2017-08-19 MED ORDER — INSULIN ASPART 100 UNIT/ML ~~LOC~~ SOLN
2.0000 [IU] | SUBCUTANEOUS | Status: DC
  Administered 2017-08-19: 6 [IU] via SUBCUTANEOUS
  Filled 2017-08-19: qty 1

## 2017-08-19 MED ORDER — OXYCODONE-ACETAMINOPHEN 5-325 MG PO TABS
1.0000 | ORAL_TABLET | Freq: Four times a day (QID) | ORAL | Status: DC | PRN
  Administered 2017-08-20 – 2017-08-22 (×8): 1 via ORAL
  Filled 2017-08-19 (×8): qty 1

## 2017-08-19 MED ORDER — TRAZODONE HCL 100 MG PO TABS
100.0000 mg | ORAL_TABLET | Freq: Every day | ORAL | Status: DC
  Administered 2017-08-19 – 2017-08-21 (×4): 100 mg via ORAL
  Filled 2017-08-19 (×4): qty 1

## 2017-08-19 MED ORDER — PANTOPRAZOLE SODIUM 40 MG PO TBEC: 40.0000 mg | DELAYED_RELEASE_TABLET | Freq: Two times a day (BID) | ORAL | Status: DC

## 2017-08-19 MED ORDER — POTASSIUM CHLORIDE 20 MEQ PO PACK
40.0000 meq | PACK | Freq: Once | ORAL | Status: AC
Start: 2017-08-19 — End: 2017-08-19
  Administered 2017-08-19: 40 meq via ORAL
  Filled 2017-08-19: qty 2

## 2017-08-19 MED ORDER — INSULIN GLARGINE 100 UNIT/ML ~~LOC~~ SOLN
35.0000 [IU] | SUBCUTANEOUS | Status: DC
  Administered 2017-08-19: 35 [IU] via SUBCUTANEOUS
  Filled 2017-08-19: qty 0.35

## 2017-08-19 MED ORDER — DEXTROSE 10 % IV SOLN: INTRAVENOUS | Status: DC | PRN

## 2017-08-19 MED ORDER — INSULIN GLARGINE 100 UNIT/ML ~~LOC~~ SOLN
38.0000 [IU] | Freq: Every day | SUBCUTANEOUS | Status: DC
  Administered 2017-08-20 – 2017-08-21 (×2): 38 [IU] via SUBCUTANEOUS
  Filled 2017-08-19 (×4): qty 0.38

## 2017-08-19 MED ORDER — INSULIN ASPART 100 UNIT/ML ~~LOC~~ SOLN
4.0000 [IU] | Freq: Three times a day (TID) | SUBCUTANEOUS | Status: DC
  Administered 2017-08-19 – 2017-08-20 (×3): 4 [IU] via SUBCUTANEOUS
  Filled 2017-08-19 (×3): qty 1

## 2017-08-19 MED ORDER — LIVING WELL WITH DIABETES BOOK
Freq: Once | Status: AC
  Administered 2017-08-19: 16:00:00
  Filled 2017-08-19: qty 1

## 2017-08-19 MED ORDER — INSULIN GLARGINE 100 UNIT/ML ~~LOC~~ SOLN: 38.0000 [IU] | Freq: Every day | SUBCUTANEOUS | Status: DC

## 2017-08-19 NOTE — Progress Notes (Signed)
[facility] pt will transport via wheelchair, no supp O2 needed, VSS, no complaints at this time, chart, meds, and belongings to transport with him

## 2017-08-19 NOTE — Care Management Note (Signed)
Case Management Note  Patient Details  Name: Mark [name] MRN: [id number] Date of Birth: 02/18/1986  Subjective/Objective:                 CM consult present to address medication needs.  This patient has had many referrals to [facility]. He has not complied with application process foe either clinic.  Patient does not actively participate with his plan of care referrals - which would meet his needs for medical follow up and obtaining his medications.  Action/Plan:      Expected Discharge Date:                  Expected Discharge Plan:     In-House Referral:     Discharge planning Services     Post Acute Care Choice:    Choice offered to:     DME Arranged:    DME Agency:     HH Arranged:    HH Agency:     Status of Service:     If discussed at [employer] Length of Stay Meetings, dates discussed:    Additional Comments:  Mark [name], Mark Schnitzler R, RN 08/19/2017, 8:42 AM

## 2017-08-19 NOTE — Progress Notes (Signed)
Inpatient Diabetes Program Recommendations  AACE/ADA: New Consensus Statement on Inpatient Glycemic Control (2015)  Target Ranges:  Prepandial:   less than 140 mg/dL      Peak postprandial:   less than 180 mg/dL (1-2 hours)      Critically ill patients:  140 - 180 mg/dL   Results for Mark [name], Mark [name] (MRN [id number]) as of 08/19/2017 09:14  Ref. Range 08/19/2017 00:35 08/19/2017 02:25 08/19/2017 03:37 08/19/2017 04:45 08/19/2017 07:19  Glucose-Capillary Latest Ref Range: 65 - 99 mg/dL 409151 (H) 811102 (H) 914106 (H)  Lantus 35 units @ 3:44 237 (H)  Novolog 6 units @ 4:50 79  Results for Mark [name], Mark [name] (MRN [id number]) as of 08/19/2017 09:14  Ref. Range 08/18/2017 14:54  Glucose Latest Ref Range: 65 - 99 mg/dL 086922 [facility](HH)   Results for Mark [name], Mark [name] (MRN [id number]) as of 08/19/2017 09:14  Ref. Range 11/25/2015 11:30  Hemoglobin A1C Latest Ref Range: 4.0 - 6.0 % 9.2 (H)   Review of Glycemic Control  Outpatient Diabetes medications: Lantus 50 units QHS, 7/30 35 units BID Current orders for Inpatient glycemic control: Lantus 35 units Q24H, Novolog 2-6 units Q4H  Inpatient Diabetes Program Recommendations: Correction (SSI): Please consider discontinuing current Novolog 2-6 units Q4H and ordering CBGs ACHS with Novolog 0-9 units TID with meals and Novolog 0-5 units QHS. Insulin - Meal Coverage: Please consider ordering Novolog 4 units TID with meals for meal coverage if patient eats at least 50% of meal. HgbA1C: Please add on an A1C to blood in lab to evaluate glycemic control over the past 2-3 months.  NOTE: Noted CM note on 08/19/17 "This patient has had many referrals to [facility]. He has not complied with application process foe either clinic.  Patient does not actively participate with his plan of care referrals - which would meet his needs for medical follow up and obtaining his medications."  Thanks, Orlando PennerMarie Miquan Tandon, RN, MSN, CDE Diabetes  Coordinator Inpatient Diabetes Program [phone] (Team Pager from 8am to 5pm)

## 2017-08-19 NOTE — Progress Notes (Signed)
Per Dr Belia HemanKasa, DC continuous fluids as patient has adequate oral intake.

## 2017-08-19 NOTE — Progress Notes (Signed)
[facility] - [place] [facility]   PATIENT NAME: Mark [name]    MR#:  [id number]  DATE OF BIRTH:  September 26, 1986  SUBJECTIVE:  CHIEF COMPLAINT:   Chief Complaint  Patient presents with  . Hyperglycemia   Better abdominal pain and nausea, no vomiting. Off insulin drip. REVIEW OF SYSTEMS:  Review of Systems  Constitutional: Positive for malaise/fatigue. Negative for chills and fever.  HENT: Negative for sore throat.   Eyes: Negative for blurred vision and double vision.  Respiratory: Negative for cough, hemoptysis, shortness of breath, wheezing and stridor.   Cardiovascular: Negative for chest pain, palpitations, orthopnea and leg swelling.  Gastrointestinal: Positive for abdominal pain, diarrhea and nausea. Negative for blood in stool, melena and vomiting.  Genitourinary: Negative for dysuria, flank pain and hematuria.  Musculoskeletal: Negative for back pain and joint pain.  Neurological: Positive for weakness. Negative for dizziness, sensory change, focal weakness, seizures, loss of consciousness and headaches.  Endo/Heme/Allergies: Negative for polydipsia.  Psychiatric/Behavioral: Positive for depression. The patient is nervous/anxious.     DRUG ALLERGIES:   Allergies  Allergen Reactions  . Penicillins Anaphylaxis, Hives and Swelling    Has patient had a PCN reaction causing immediate rash, facial/tongue/throat swelling, SOB or lightheadedness with hypotension: Yes Has patient had a PCN reaction causing severe rash involving mucus membranes or skin necrosis: No Has patient had a PCN reaction that required hospitalization Yes Has patient had a PCN reaction occurring within the last 10 years: Yes If all of the above answers are "NO", then may proceed with Cephalosporin use.  . Sulfa Antibiotics Anaphylaxis and Hives  . Clindamycin/Lincomycin Hives   VITALS:  Blood pressure 97/66, pulse 86, temperature 97.8 F (36.6 C), temperature source Oral, resp.  rate 15, height 6\' 2"  (1.88 m), weight 194 lb 10.7 oz (88.3 kg), SpO2 95 %. PHYSICAL EXAMINATION:  Physical Exam  Constitutional: He is oriented to person, place, and time and well-developed, well-nourished, and in no distress.  HENT:  Head: Normocephalic.  Mouth/Throat: Oropharynx is clear and moist.  Eyes: Pupils are equal, round, and reactive to light. Conjunctivae and EOM are normal. No scleral icterus.  Neck: Normal range of motion. Neck supple. No JVD present. No tracheal deviation present.  Cardiovascular: Normal rate, regular rhythm and normal heart sounds.  Exam reveals no gallop.   No murmur heard. Pulmonary/Chest: Effort normal and breath sounds normal. No respiratory distress. He has no wheezes. He has no rales.  Abdominal: Soft. Bowel sounds are normal. He exhibits no distension. There is tenderness. There is no rebound.  Musculoskeletal: Normal range of motion. He exhibits no edema or tenderness.  Neurological: He is alert and oriented to person, place, and time. No cranial nerve deficit.  Skin: No rash noted. No erythema.  Psychiatric: Affect normal.   LABORATORY PANEL:  Male CBC  Recent Labs Lab 08/19/17 0324  WBC 15.0*  HGB 12.1*  HCT 34.1*  PLT 266   ------------------------------------------------------------------------------------------------------------------ Chemistries   Recent Labs Lab 08/18/17 2323 08/19/17 0324  NA 133* 134*  K 3.8 3.4*  CL 101 102  CO2 19* 21*  GLUCOSE 116* 106*  BUN 14 10  CREATININE 1.07 0.88  CALCIUM 8.7* 8.3*  MG 1.8  --   AST  --  68*  ALT  --  78*  ALKPHOS  --  123  BILITOT  --  0.9   RADIOLOGY:  No results found. ASSESSMENT AND PLAN:    DKA (diabetic ketoacidoses) Resolved. Off insulin drip.  T1DM, Lantus 38 units daily and sliding scale.  Nausea & vomiting and Abdominal pain. Due to DKA, improving. Hyponatremia, improving. Treated with normal saline IV and follow-up BMP. Hypokalemia. Give potassium  supplement and follow-up level. Leukocytosis. Due to reaction. Improving. Depression and anxiety. Follow-up with psych consult. Tobacco abuse. Smoking cessation was counseled for 4 minutes.  All the records are reviewed and case discussed with Care Management/Social Worker. Management plans discussed with the patient, family and they are in agreement.  CODE STATUS: Full Code  TOTAL TIME TAKING CARE OF THIS PATIENT: 37 minutes.   More than 50% of the time was spent in counseling/coordination of care: YES  POSSIBLE D/C IN 2 DAYS, DEPENDING ON CLINICAL CONDITION.   Shaune Pollackhen, Channelle Bottger M.D on 08/19/2017 at 12:44 PM  Between 7am to 6pm - Pager - [phone]  After 6pm go to [email or web address] - Therapist, nutritionalpassword [email or web address]  [facility]  [facility]

## 2017-08-19 NOTE — Consult Note (Signed)
[facility] Face-to-Face Psychiatry Consult   Reason for Consult: Consult for 31 year old man with a past history of depression and severe medical problems.  Question about depression Referring Physician: Sparks Patient Identification: WASHINGTON WHEDBEE MRN:  [id number] Principal Diagnosis: Severe recurrent major depression without psychotic features [facility]) Diagnosis:   Patient Active Problem List   Diagnosis Date Noted  . Severe recurrent major depression without psychotic features ([place]) [F33.2] 08/19/2017  . Nausea & vomiting [R11.2] 08/18/2017  . Abdominal pain [R10.9] 08/18/2017  . Hyponatremia [E87.1] 08/18/2017  . DKA (diabetic ketoacidoses) ([place]) [E13.10] 11/24/2015  . Diabetic keto-acidosis ([place]) [E13.10] 10/23/2015    Total Time spent with patient: 1 hour  Subjective:   CHASON MCIVER is a 31 y.o. male patient admitted with "I am not feeling good at all".  HPI: Patient interviewed chart reviewed.  31 year old man with a history of poorly controlled diabetes came into the hospital with extraordinarily high blood sugars and diabetic ketoacidosis.  Expressed some symptoms of depression.  On interview today the patient states that his mood has been very down and low for 1-2 months.  He has had multiple losses this year.  His mother died last year after an extended illness, he lost his most recent job in a manner that he thinks was unfair, and a few days ago a friend of his died of a narcotic overdose.  Patient says he feels sad and negative most of the time.  Feels hopeless about his situation.  He is currently staying in a boardinghouse and he does very little but sit around staring at the walls.  He has thoughts about wishing he would die but says he has no intention of doing anything actively to harm himself.  Denies hallucinations.  Currently not receiving any specific psychiatric treatment.  Smokes marijuana occasionally when he can get it but denies other drug abuse.  Patient has very  minimal support and does not get disability.  Social history: Currently not working.  Has been living in a boardinghouse but because he is running out of money he thinks he will have to go to the shelter.  Looking back over old notes, there have been references to the patient considering applying for disability for at least the past 5 years.  Evidently he has never seriously pursued it despite his severe medical problems.  Patient has a sister with whom he has some contact but apparently he is does not see her as being able to provide serious support for him.  Medical history: Patient has diabetes and is dependent on insulin.  He admits that he had been completely off of insulin for several days before coming into the hospital because he ran out of it.  Looking back over several old notes it looks like providers have made an effort to refill his medicines but the patient has come up with excuses and reasons why he cannot go and pick it up.  He has a long history of multiple hospitalizations for diabetic ketoacidosis with poor efforts at taking care of his sugars.  Substance abuse history: Smokes weed.  Otherwise does not have major substance abuse issues.  Past Psychiatric History: The last few years his psychiatric consultations were mostly around his capacity and behavior with his diabetes but he does have a well established past history of depression.  He had several admissions when he was much younger.  He has at least one past suicide attempt by wrist cutting.  He has been tried on several medicines  including Prozac Celexa Seroquel and Risperdal that he can remember.  He denies in a blanket manner that any medicines were ever of any help for him although he then admits that may be Klonopin was good.  Risk to Self: Is patient at risk for suicide?: No Risk to Others:   Prior Inpatient Therapy:   Prior Outpatient Therapy:    Past Medical History:  Past Medical History:  Diagnosis Date  . Diabetes  mellitus without complication ([place])   . Hydrocephalus     Past Surgical History:  Procedure Laterality Date  . KIDNEY STONE SURGERY     Family History:  Family History  Problem Relation Age of Onset  . Breast cancer Mother    Family Psychiatric  History: Positive for depression Social History:  History  Alcohol Use No    Comment: ocasional      History  Drug Use No    Social History   Social History  . Marital status: Single    Spouse name: N/A  . Number of children: N/A  . Years of education: N/A   Social History Main Topics  . Smoking status: Current Every Day Smoker    Packs/day: 1.00    Types: Cigarettes  . Smokeless tobacco: Never Used  . Alcohol use No     Comment: ocasional   . Drug use: No  . Sexual activity: Not Asked   Other Topics Concern  . None   Social History Narrative  . None   Additional Social History:    Allergies:   Allergies  Allergen Reactions  . Penicillins Anaphylaxis, Hives and Swelling    Has patient had a PCN reaction causing immediate rash, facial/tongue/throat swelling, SOB or lightheadedness with hypotension: Yes Has patient had a PCN reaction causing severe rash involving mucus membranes or skin necrosis: No Has patient had a PCN reaction that required hospitalization Yes Has patient had a PCN reaction occurring within the last 10 years: Yes If all of the above answers are "NO", then may proceed with Cephalosporin use.  . Sulfa Antibiotics Anaphylaxis and Hives  . Clindamycin/Lincomycin Hives    Labs:  Results for orders placed or performed during the hospital encounter of 08/18/17 (from the past 48 hour(s))  Blood gas, venous     Status: Abnormal   Collection Time: 08/18/17  2:32 PM  Result Value Ref Range   FIO2 0.21    pH, Ven 7.20 (L) 7.250 - 7.430   pCO2, Ven 26 (L) 44.0 - 60.0 mmHg   pO2, Ven 80.0 (H) 32.0 - 45.0 mmHg   Bicarbonate 10.2 (L) 20.0 - 28.0 mmol/L   Acid-base deficit 16.1 (H) 0.0 - 2.0 mmol/L   O2  Saturation 92.5 %   Patient temperature 37.0    Collection site VEIN    Sample type VENOUS   Urinalysis, Complete w Microscopic     Status: Abnormal   Collection Time: 08/18/17  2:32 PM  Result Value Ref Range   Color, Urine COLORLESS (A) YELLOW   APPearance CLEAR (A) CLEAR   Specific Gravity, Urine 1.022 1.005 - 1.030   pH 6.0 5.0 - 8.0   Glucose, UA >=500 (A) NEGATIVE mg/dL   Hgb urine dipstick MODERATE (A) NEGATIVE   Bilirubin Urine NEGATIVE NEGATIVE   Ketones, ur 80 (A) NEGATIVE mg/dL   Protein, ur NEGATIVE NEGATIVE mg/dL   Nitrite NEGATIVE NEGATIVE   Leukocytes, UA NEGATIVE NEGATIVE   RBC / HPF 0-5 0 - 5 RBC/hpf   WBC, UA  0-5 0 - 5 WBC/hpf   Bacteria, UA NONE SEEN NONE SEEN   Squamous Epithelial / LPF 0-5 (A) NONE SEEN  Comprehensive metabolic panel     Status: Abnormal   Collection Time: 08/18/17  2:54 PM  Result Value Ref Range   Sodium 125 (L) 135 - 145 mmol/L   Potassium 4.7 3.5 - 5.1 mmol/L   Chloride 90 (L) 101 - 111 mmol/L   CO2 10 (L) 22 - 32 mmol/L   Glucose, Bld 922 (HH) 65 - 99 mg/dL    Comment: CRITICAL RESULT CALLED TO, READ BACK BY AND VERIFIED WITH EMMA HUNTER 08/18/17 @ 1602  MLK    BUN 23 (H) 6 - 20 mg/dL   Creatinine, Ser 1.40 (H) 0.61 - 1.24 mg/dL   Calcium 8.9 8.9 - 10.3 mg/dL   Total Protein 7.4 6.5 - 8.1 g/dL   Albumin 4.2 3.5 - 5.0 g/dL   AST 98 (H) 15 - 41 U/L   ALT 110 (H) 17 - 63 U/L   Alkaline Phosphatase 173 (H) 38 - 126 U/L   Total Bilirubin 2.7 (H) 0.3 - 1.2 mg/dL   GFR calc non Af Amer >60 >60 mL/min   GFR calc Af Amer >60 >60 mL/min    Comment: (NOTE) The eGFR has been calculated using the CKD EPI equation. This calculation has not been validated in all clinical situations. eGFR's persistently <60 mL/min signify possible Chronic Kidney Disease.    Anion gap 25 (H) 5 - 15  CBC with Differential     Status: Abnormal   Collection Time: 08/18/17  2:54 PM  Result Value Ref Range   WBC 21.7 (H) 3.8 - 10.6 K/uL   RBC 4.34 (L) 4.40 -  5.90 MIL/uL   Hemoglobin 14.3 13.0 - 18.0 g/dL   HCT 45.4 40.0 - 52.0 %   MCV 104.7 (H) 80.0 - 100.0 fL   MCH 32.9 26.0 - 34.0 pg   MCHC 31.4 (L) 32.0 - 36.0 g/dL   RDW 16.2 (H) 11.5 - 14.5 %   Platelets 427 150 - 440 K/uL   Neutrophils Relative % 85 %   Neutro Abs 18.4 (H) 1.4 - 6.5 K/uL   Lymphocytes Relative 12 %   Lymphs Abs 2.5 1.0 - 3.6 K/uL   Monocytes Relative 3 %   Monocytes Absolute 0.7 0.2 - 1.0 K/uL   Eosinophils Relative 0 %   Eosinophils Absolute 0.1 0 - 0.7 K/uL   Basophils Relative 0 %   Basophils Absolute 0.1 0 - 0.1 K/uL  Beta-hydroxybutyric acid     Status: Abnormal   Collection Time: 08/18/17  2:54 PM  Result Value Ref Range   Beta-Hydroxybutyric Acid >8.00 (H) 0.05 - 0.27 mmol/L    Comment: RESULT CONFIRMED BY MANUAL DILUTION  Urine Drug Screen, Qualitative     Status: Abnormal   Collection Time: 08/18/17  2:54 PM  Result Value Ref Range   Tricyclic, Ur Screen NONE DETECTED NONE DETECTED   Amphetamines, Ur Screen NONE DETECTED NONE DETECTED   MDMA (Ecstasy)Ur Screen NONE DETECTED NONE DETECTED   Cocaine Metabolite,Ur [place] NONE DETECTED NONE DETECTED   Opiate, Ur Screen NONE DETECTED NONE DETECTED   Phencyclidine (PCP) Ur S NONE DETECTED NONE DETECTED   Cannabinoid 50 Ng, Ur [place] POSITIVE (A) NONE DETECTED   Barbiturates, Ur Screen NONE DETECTED NONE DETECTED   Benzodiazepine, Ur Scrn NONE DETECTED NONE DETECTED   Methadone Scn, Ur NONE DETECTED NONE DETECTED    Comment: (NOTE) 100  Tricyclics, urine               Cutoff 1000 ng/mL 200  Amphetamines, urine             Cutoff 1000 ng/mL 300  MDMA (Ecstasy), urine           Cutoff 500 ng/mL 400  Cocaine Metabolite, urine       Cutoff 300 ng/mL 500  Opiate, urine                   Cutoff 300 ng/mL 600  Phencyclidine (PCP), urine      Cutoff 25 ng/mL 700  Cannabinoid, urine              Cutoff 50 ng/mL 800  Barbiturates, urine             Cutoff 200 ng/mL 900  Benzodiazepine, urine           Cutoff 200  ng/mL 1000 Methadone, urine                Cutoff 300 ng/mL 1100 1200 The urine drug screen provides only a preliminary, unconfirmed 1300 analytical test result and should not be used for non-medical 1400 purposes. Clinical consideration and professional judgment should 1500 be applied to any positive drug screen result due to possible 1600 interfering substances. A more specific alternate chemical method 1700 must be used in order to obtain a confirmed analytical result.  1800 Gas chromato graphy / mass spectrometry (GC/MS) is the preferred 1900 confirmatory method.   Ethanol     Status: None   Collection Time: 08/18/17  2:54 PM  Result Value Ref Range   Alcohol, Ethyl (B) <10 <10 mg/dL    Comment:        LOWEST DETECTABLE LIMIT FOR SERUM ALCOHOL IS 10 mg/dL FOR MEDICAL PURPOSES ONLY   Glucose, capillary     Status: Abnormal   Collection Time: 08/18/17  3:46 PM  Result Value Ref Range   Glucose-Capillary >600 (HH) 65 - 99 mg/dL  Glucose, capillary     Status: Abnormal   Collection Time: 08/18/17  4:43 PM  Result Value Ref Range   Glucose-Capillary >600 (HH) 65 - 99 mg/dL  MRSA PCR Screening     Status: None   Collection Time: 08/18/17  5:22 PM  Result Value Ref Range   MRSA by PCR NEGATIVE NEGATIVE    Comment:        The GeneXpert MRSA Assay (FDA approved for NASAL specimens only), is one component of a comprehensive MRSA colonization surveillance program. It is not intended to diagnose MRSA infection nor to guide or monitor treatment for MRSA infections.   Glucose, capillary     Status: Abnormal   Collection Time: 08/18/17  5:30 PM  Result Value Ref Range   Glucose-Capillary >600 (HH) 65 - 99 mg/dL  Glucose, capillary     Status: Abnormal   Collection Time: 08/18/17  6:33 PM  Result Value Ref Range   Glucose-Capillary 391 (H) 65 - 99 mg/dL  Glucose, capillary     Status: Abnormal   Collection Time: 08/18/17  7:34 PM  Result Value Ref Range   Glucose-Capillary  247 (H) 65 - 99 mg/dL   Comment 1 Notify RN    Comment 2 Document in Chart   Basic metabolic panel     Status: Abnormal   Collection Time: 08/18/17  7:52 PM  Result Value Ref Range   Sodium 136 135 - 145 mmol/L  Comment: RESULTS VERIFIED BY REPEAT TESTING   Potassium 4.6 3.5 - 5.1 mmol/L   Chloride 102 101 - 111 mmol/L   CO2 11 (L) 22 - 32 mmol/L   Glucose, Bld 157 (H) 65 - 99 mg/dL   BUN 19 6 - 20 mg/dL   Creatinine, Ser 1.38 (H) 0.61 - 1.24 mg/dL   Calcium 8.9 8.9 - 10.3 mg/dL   GFR calc non Af Amer >60 >60 mL/min   GFR calc Af Amer >60 >60 mL/min    Comment: (NOTE) The eGFR has been calculated using the CKD EPI equation. This calculation has not been validated in all clinical situations. eGFR's persistently <60 mL/min signify possible Chronic Kidney Disease.    Anion gap 23 (H) 5 - 15  Glucose, capillary     Status: Abnormal   Collection Time: 08/18/17  8:32 PM  Result Value Ref Range   Glucose-Capillary 187 (H) 65 - 99 mg/dL  Glucose, capillary     Status: Abnormal   Collection Time: 08/18/17  9:33 PM  Result Value Ref Range   Glucose-Capillary 187 (H) 65 - 99 mg/dL  Glucose, capillary     Status: Abnormal   Collection Time: 08/18/17 10:36 PM  Result Value Ref Range   Glucose-Capillary 130 (H) 65 - 99 mg/dL  Basic metabolic panel     Status: Abnormal   Collection Time: 08/18/17 11:23 PM  Result Value Ref Range   Sodium 133 (L) 135 - 145 mmol/L   Potassium 3.8 3.5 - 5.1 mmol/L   Chloride 101 101 - 111 mmol/L   CO2 19 (L) 22 - 32 mmol/L   Glucose, Bld 116 (H) 65 - 99 mg/dL   BUN 14 6 - 20 mg/dL   Creatinine, Ser 1.07 0.61 - 1.24 mg/dL   Calcium 8.7 (L) 8.9 - 10.3 mg/dL   GFR calc non Af Amer >60 >60 mL/min   GFR calc Af Amer >60 >60 mL/min    Comment: (NOTE) The eGFR has been calculated using the CKD EPI equation. This calculation has not been validated in all clinical situations. eGFR's persistently <60 mL/min signify possible Chronic Kidney Disease.     Anion gap 13 5 - 15  Magnesium     Status: None   Collection Time: 08/18/17 11:23 PM  Result Value Ref Range   Magnesium 1.8 1.7 - 2.4 mg/dL  Phosphorus     Status: Abnormal   Collection Time: 08/18/17 11:23 PM  Result Value Ref Range   Phosphorus 1.9 (L) 2.5 - 4.6 mg/dL  Glucose, capillary     Status: Abnormal   Collection Time: 08/18/17 11:39 PM  Result Value Ref Range   Glucose-Capillary 122 (H) 65 - 99 mg/dL  Glucose, capillary     Status: Abnormal   Collection Time: 08/19/17 12:35 AM  Result Value Ref Range   Glucose-Capillary 151 (H) 65 - 99 mg/dL  Glucose, capillary     Status: Abnormal   Collection Time: 08/19/17  2:25 AM  Result Value Ref Range   Glucose-Capillary 102 (H) 65 - 99 mg/dL   Comment 1 Notify RN    Comment 2 Document in Chart   Comprehensive metabolic panel     Status: Abnormal   Collection Time: 08/19/17  3:24 AM  Result Value Ref Range   Sodium 134 (L) 135 - 145 mmol/L   Potassium 3.4 (L) 3.5 - 5.1 mmol/L   Chloride 102 101 - 111 mmol/L   CO2 21 (L) 22 - 32 mmol/L  Glucose, Bld 106 (H) 65 - 99 mg/dL   BUN 10 6 - 20 mg/dL   Creatinine, Ser 0.88 0.61 - 1.24 mg/dL   Calcium 8.3 (L) 8.9 - 10.3 mg/dL   Total Protein 5.8 (L) 6.5 - 8.1 g/dL   Albumin 3.1 (L) 3.5 - 5.0 g/dL   AST 68 (H) 15 - 41 U/L   ALT 78 (H) 17 - 63 U/L   Alkaline Phosphatase 123 38 - 126 U/L   Total Bilirubin 0.9 0.3 - 1.2 mg/dL   GFR calc non Af Amer >60 >60 mL/min   GFR calc Af Amer >60 >60 mL/min    Comment: (NOTE) The eGFR has been calculated using the CKD EPI equation. This calculation has not been validated in all clinical situations. eGFR's persistently <60 mL/min signify possible Chronic Kidney Disease.    Anion gap 11 5 - 15  CBC     Status: Abnormal   Collection Time: 08/19/17  3:24 AM  Result Value Ref Range   WBC 15.0 (H) 3.8 - 10.6 K/uL   RBC 3.56 (L) 4.40 - 5.90 MIL/uL   Hemoglobin 12.1 (L) 13.0 - 18.0 g/dL   HCT 34.1 (L) 40.0 - 52.0 %   MCV 95.9 80.0 - 100.0  fL   MCH 34.1 (H) 26.0 - 34.0 pg   MCHC 35.6 32.0 - 36.0 g/dL   RDW 14.9 (H) 11.5 - 14.5 %   Platelets 266 150 - 440 K/uL  Glucose, capillary     Status: Abnormal   Collection Time: 08/19/17  3:37 AM  Result Value Ref Range   Glucose-Capillary 106 (H) 65 - 99 mg/dL  Glucose, capillary     Status: Abnormal   Collection Time: 08/19/17  4:45 AM  Result Value Ref Range   Glucose-Capillary 237 (H) 65 - 99 mg/dL  Glucose, capillary     Status: None   Collection Time: 08/19/17  7:19 AM  Result Value Ref Range   Glucose-Capillary 79 65 - 99 mg/dL  Glucose, capillary     Status: Abnormal   Collection Time: 08/19/17 11:23 AM  Result Value Ref Range   Glucose-Capillary 250 (H) 65 - 99 mg/dL  Glucose, capillary     Status: Abnormal   Collection Time: 08/19/17  4:57 PM  Result Value Ref Range   Glucose-Capillary 212 (H) 65 - 99 mg/dL   Comment 1 Notify RN     Current Facility-Administered Medications  Medication Dose Route Frequency Provider Last Rate Last Dose  . acetaminophen (TYLENOL) tablet 650 mg  650 mg Oral Q6H PRN Idelle Crouch, MD   650 mg at 08/18/17 1755   Or  . acetaminophen (TYLENOL) suppository 650 mg  650 mg Rectal Q6H PRN Idelle Crouch, MD      . alum & mag hydroxide-simeth (MAALOX/MYLANTA) 200-200-20 MG/5ML suspension 30 mL  30 mL Oral Q4H PRN Idelle Crouch, MD   30 mL at 08/18/17 1848  . bisacodyl (DULCOLAX) suppository 10 mg  10 mg Rectal Daily PRN Idelle Crouch, MD      . diphenhydrAMINE (BENADRYL) capsule 50 mg  50 mg Oral QHS PRN Mikael Spray, NP   50 mg at 08/18/17 2158  . docusate sodium (COLACE) capsule 100 mg  100 mg Oral BID Idelle Crouch, MD   100 mg at 08/18/17 2145  . gabapentin (NEURONTIN) capsule 600 mg  600 mg Oral TID Idelle Crouch, MD   600 mg at 08/19/17 1643  . heparin injection  5,000 Units  5,000 Units Subcutaneous Q8H Idelle Crouch, MD   5,000 Units at 08/19/17 1333  . insulin aspart (novoLOG) injection 0-5 Units  0-5  Units Subcutaneous QHS Kasa, Kurian, MD      . insulin aspart (novoLOG) injection 0-9 Units  0-9 Units Subcutaneous TID WC Flora Lipps, MD   3 Units at 08/19/17 1720  . insulin aspart (novoLOG) injection 4 Units  4 Units Subcutaneous TID WC Flora Lipps, MD   4 Units at 08/19/17 1720  . [START ON 08/20/2017] insulin glargine (LANTUS) injection 38 Units  38 Units Subcutaneous Daily Demetrios Loll, MD      . ipratropium-albuterol (DUONEB) 0.5-2.5 (3) MG/3ML nebulizer solution 3 mL  3 mL Nebulization Q4H PRN Flora Lipps, MD      . LORazepam (ATIVAN) injection 0.5 mg  0.5 mg Intravenous Q4H PRN Idelle Crouch, MD      . nicotine (NICODERM CQ - dosed in mg/24 hours) patch 21 mg  21 mg Transdermal Daily Idelle Crouch, MD   21 mg at 08/19/17 6720  . ondansetron (ZOFRAN) tablet 4 mg  4 mg Oral Q6H PRN Idelle Crouch, MD       Or  . ondansetron [facility]) injection 4 mg  4 mg Intravenous Q6H PRN Idelle Crouch, MD   4 mg at 08/18/17 1755  . oxyCODONE-acetaminophen (PERCOCET/ROXICET) 5-325 MG per tablet 1 tablet  1 tablet Oral Q6H PRN Demetrios Loll, MD      . traZODone (DESYREL) tablet 100 mg  100 mg Oral QHS Dorene Sorrow S, NP   100 mg at 08/19/17 0119    Musculoskeletal: Strength & Muscle Tone: decreased Gait & Station: unsteady Patient leans: N/A  Psychiatric Specialty Exam: Physical Exam  Nursing note and vitals reviewed. Constitutional: He appears well-developed and well-nourished.  HENT:  Head: Normocephalic and atraumatic.  Eyes: Pupils are equal, round, and reactive to light. Conjunctivae are normal.  Neck: Normal range of motion.  Cardiovascular: Regular rhythm and normal heart sounds.   Respiratory: Effort normal. No respiratory distress.  GI: Soft.  Musculoskeletal: Normal range of motion.  Neurological: He is alert.  Skin: Skin is warm and dry.  Psychiatric: His affect is blunt. His speech is delayed. He is slowed and withdrawn. Cognition and memory are impaired. He  expresses impulsivity. He exhibits a depressed mood. He expresses suicidal ideation. He expresses no suicidal plans.    Review of Systems  Constitutional: Negative.   HENT: Negative.   Eyes: Negative.   Respiratory: Negative.   Cardiovascular: Negative.   Gastrointestinal: Negative.   Musculoskeletal: Negative.   Skin: Negative.   Neurological: Negative.   Psychiatric/Behavioral: Positive for depression and suicidal ideas. Negative for hallucinations, memory loss and substance abuse. The patient is nervous/anxious and has insomnia.     Blood pressure 106/70, pulse 63, temperature 98.3 F (36.8 C), temperature source Oral, resp. rate 14, height 6' 2"  (1.88 m), weight 88.3 kg (194 lb 10.7 oz), SpO2 100 %.Body mass index is 24.99 kg/m.  General Appearance: Disheveled  Eye Contact:  Minimal  Speech:  Slow  Volume:  Decreased  Mood:  Depressed  Affect:  Constricted  Thought Process:  Goal Directed  Orientation:  Full (Time, Place, and Person)  Thought Content:  Rumination  Suicidal Thoughts:  Yes.  without intent/plan  Homicidal Thoughts:  No  Memory:  Immediate;   Good Recent;   Fair Remote;   Fair  Judgement:  Impaired  Insight:  Shallow  Psychomotor  Activity:  Decreased  Concentration:  Concentration: Fair  Recall:  [employer] of Knowledge:  Fair  Language:  Fair  Akathisia:  No  Handed:  Right  AIMS (if indicated):     Assets:  Desire for Improvement  ADL's:  Impaired  Cognition:  Impaired,  Mild  Sleep:        Treatment Plan Summary: Plan 31 year old man with major depression severe recurrent without psychotic features.  Very passive personality.  I suggested to him that with his current symptoms I think it would be appropriate to admit him to the psychiatric ward.  Patient initially refused out right saying that the ward here at our hospital was "a dungeon".  By the end of our conversation he seemed to be reconsidering this a little bit although he still was coming  up with excuses.  I do not think he meets commitment criteria as he is not actively threatening to kill himself and is currently calm and rational.  I offered to start him on an antidepressant specifically mirtazapine but he would not commit to agreeing to that either.  I am therefore going to defer starting any medicine.  I will check in with him again tomorrow.  If he were to agree to psychiatric admission and we had a bed available I would be happy to admit him downstairs.  Otherwise I will rediscuss medication with him and refer him to outpatient treatment.  Disposition: Recommend psychiatric Inpatient admission when medically cleared.  Alethia Berthold, MD 08/19/2017 8:46 PM

## 2017-08-19 NOTE — Progress Notes (Signed)
Inpatient Diabetes Program Recommendations  AACE/ADA: New Consensus Statement on Inpatient Glycemic Control (2015)  Target Ranges:  Prepandial:   less than 140 mg/dL      Peak postprandial:   less than 180 mg/dL (1-2 hours)      Critically ill patients:  140 - 180 mg/dL   Results for Mark [name], Dredyn Q (MRN [id number]) as of 08/19/2017 13:56  Ref. Range 08/18/2017 22:36 08/18/2017 23:39 08/19/2017 00:35 08/19/2017 02:25 08/19/2017 03:37 08/19/2017 04:45 08/19/2017 07:19 08/19/2017 11:23  Glucose-Capillary Latest Ref Range: 65 - 99 mg/dL 409130 (H) 811122 (H) 914151 (H) 102 (H) 106 (H) 237 (H) 79 250 (H)  Results for Mark [name], Joshva Q (MRN [id number]) as of 08/19/2017 13:56  Ref. Range 08/18/2017 14:54  Glucose Latest Ref Range: 65 - 99 mg/dL 086922 (HH)   Review of Glycemic Control  Diabetes history: DM2 Outpatient Diabetes medications: Lantus 50 units QHS, Novolog 35 units QID Current orders for Inpatient glycemic control: Lantus 38 units daily, Novolog 0-9 units TID with meals, Novolog 0-5 units QHS, Novolog 4 units TID with meals  NOTE: Spoke with patient about diabetes and home regimen for diabetes control. Patient reports that he does not have any insurance nor does he have a PCP at this time. Patient states [facility] and was able to get medical care and medication through program there. Patient states that he has ran out of Lantus and Novolog insulin which he was given through the program. He ran out of both insulins about 4-5 days ago. Inquired about glucose monitoring and patient states he has not checked his glucose in over a year but he reports that he has a glucometer at home but NO test strips. Patient states that he can tell if his sugar is high or low by how he feels. Inquired about prior A1C and patient reports that he does not recall his last A1C value.  Discussed glucose and A1C goals. Discussed importance of checking CBGs and maintaining good CBG control to prevent long-term  and short-term complications. Explained importance of checking glucose in order to know exactly what his glucose is and how glucose monitoring helps doctors to know what insulin adjustments to make. Explained how hyperglycemia leads to damage within blood vessels which lead to the common complications seen with uncontrolled diabetes. Stressed to the patient the importance of improving glycemic control to prevent further complications from uncontrolled diabetes. Discussed impact of nutrition, exercise, stress, sickness, and medications on diabetes control.Inquired about if CM has talked with patient about [facility] and [facility]. Patient stated that someone did talk to him about both. Patient stated that [facility] and get his medications but he can not go to the [facility]. Inquired about why he can not go to the [facility] and patient states that he does not have any transportation. Explained that at discharge, [facility] could only provide him with 30 day supply of medications. He would need to fill out the application to see if he qualifies for Medication Assistance and that he will have to have a doctor to provide prescriptions for medications and therefore he would need to see a doctor at the [facility] regularly. Encouraged patient to start checking his glucose and to take gluocometer with him to doctor appointments for follow up. Patient states that he will try to work on getting help with transportation so he could follow up at the [facility].  Patient  verbalized understanding of information discussed and he states that he has no further questions at this time related to diabetes.  [facility] and was told that they have Lantus (pens) and Humalog (vials). At time of discharge, please provide patient with Rx for: test strips, Lantus pens, Humalog vials, insulin pen needles, and insulin  syringes.  Thanks, [name], RN, MSN, CDE Diabetes Coordinator Inpatient Diabetes Program [phone] (Team Pager)

## 2017-08-19 NOTE — Progress Notes (Signed)
Initial Nutrition Assessment  DOCUMENTATION CODES:   Not applicable  INTERVENTION:  Provided patient with section of [employer] that provides information on food resources available in the county including [employer]), [employer], and food pantries. Encouraged use of community resources after discharge.  Encouraged intake of three well-balanced meals daily. Discussed importance of obtaining adequate calories and protein to prevent further unintentional weight loss or loss of lean body mass.  NUTRITION DIAGNOSIS:   Inadequate oral intake related to social / environmental circumstances (limited access to food) as evidenced by per patient/family report, 15.2 percent weight loss over 9 months.  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   Spoke with patient at bedside. He was resting in dark room, but woke up for assessment. Patient reports he has not been eating well PTA. He eats "when he can." Confirms he has some difficulty obtaining food at home. When able he shops for food at [employer] and has some income from [employer]. He does not currently have Food Stamps or any other food resources. He is amenable to getting information on food resources in the community and confirms he stays in [place]. He reports he is hungry and has been eating well here. He does not want an oral nutrition supplement. Denies any current N/V, abdominal pain, constipation/diarrhea, difficulty chewing/swallowing.  UBW 250 lbs. Patient reports he has had weight loss, but he did not realize it until he got weighed  on admission. Per review of weight history in chart patient was only 227 lbs in 2017. On 11/11/2016 he was 229.6 lbs. He has lost 34.9 lbs (15.2% body weight) over the past 9 months, which is significant for time frame.  Medications reviewed and include: Colace, Novolog 0-9 units TID, Novolog 0-5 units QHS, Novolog 4 units TID.  Labs reviewed: CBG 79-237, Sodium 134, Potassium 3.4, CO2 21.  I/O: 1750 mL UOP yesterday; 6 occurrences emesis yesterday  Discussed with RN.  Patient is at risk for malnutrition in setting of social/environmental circumstances, but does not meet criteria at this time.  NUTRITION - FOCUSED PHYSICAL EXAM:    Most Recent Value  Orbital Region  No depletion  Upper Arm Region  No depletion  Thoracic and Lumbar Region  No depletion  Buccal Region  No depletion  Temple Region  No depletion  Clavicle Bone Region  No depletion  Clavicle and Acromion Bone Region  No depletion  Scapular Bone Region  No depletion  Dorsal Hand  No depletion  Patellar Region  No depletion  Anterior Thigh Region  No depletion  Posterior Calf Region  No depletion  Edema (RD Assessment)  None  Hair  Reviewed  Eyes  Reviewed  Mouth  Reviewed [poor dentition]  Skin  Reviewed  Nails  Reviewed       Diet Order:  Diet Carb Modified Fluid consistency: Thin; Room service appropriate? Yes  EDUCATION NEEDS:   Education needs have been addressed  Skin:  Skin Assessment: Reviewed RN Assessment (no issues)  Last BM:  PTA (08/17/2017 per chart)  Height:   Ht Readings from  Last 1 Encounters:  08/18/17 6\' 2"  (1.88 m)    Weight:   Wt Readings from Last 1 Encounters:  08/18/17 194 lb 10.7 oz (88.3 kg)    Ideal Body Weight:  86.4 kg  BMI:  Body mass index is 24.99 kg/m.  Estimated Nutritional Needs:   Kcal:  2280-2500 (MSJ x 1.2-1.3)  Protein:  90-105 grams (1-1.2 grams/kg)  Fluid:  2.6-3 L/day (30-35 mL/kg)  Helane RimaLeanne Glennon Kopko, MS, RD, LDN Office: [phone] Pager:  [phone] After Hours/Weekend Pager: [phone]

## 2017-08-19 NOTE — Consult Note (Signed)
PULMONARY / CRITICAL CARE MEDICINE   Name: Mark JackMichael Q Chenevert MRN: [id number] DOB: 05/19/1986    ADMISSION DATE:  08/18/2017   CONSULTATION DATE:  08/18/2017  REFERRING MD:  Dr. Judithann SheenSparks  REASON: Diabetic ketoacidosis   CHIEF COMPLAINT: Hypoglycemia   HISTORY OF PRESENT ILLNESS:  This is a 31 year old Caucasian male with a history of type 1 diabetes, polysubstance abuse and not adherence who presented to the ED via EMS with complaints of hypoglycemia. Patient stated that he ran out of his insulin and felt that he could control his diabetes with diet alone. Also complained of vomiting 3 days. His blood sugar in the ED was 922 mg/dL, his CO2 was 10 and his anion gap was 25. He was admitted for further management of DKA. Patient is complaining of mild nausea, but reports significant improvement and vomiting.  PAST MEDICAL HISTORY :  He  has a past medical history of Diabetes mellitus without complication (HCC) and Hydrocephalus.  PAST SURGICAL HISTORY: He  has a past surgical history that includes Kidney stone surgery.  Allergies  Allergen Reactions  . Penicillins Anaphylaxis, Hives and Swelling    Has patient had a PCN reaction causing immediate rash, facial/tongue/throat swelling, SOB or lightheadedness with hypotension: Yes Has patient had a PCN reaction causing severe rash involving mucus membranes or skin necrosis: No Has patient had a PCN reaction that required hospitalization Yes Has patient had a PCN reaction occurring within the last 10 years: Yes If all of the above answers are "NO", then may proceed with Cephalosporin use.  . Sulfa Antibiotics Anaphylaxis and Hives  . Clindamycin/Lincomycin Hives    No current facility-administered medications on file prior to encounter.    Current Outpatient Prescriptions on File Prior to Encounter  Medication Sig  . insulin aspart protamine- aspart (NOVOLOG MIX 70/30) (70-30) 100 UNIT/ML injection Inject 0.35 mLs (35 Units total) into  the skin 2 (two) times daily with a meal.  . nicotine (NICODERM CQ - DOSED IN MG/24 HOURS) 21 mg/24hr patch Place 1 patch (21 mg total) onto the skin daily. (Patient not taking: Reported on 11/26/2015)    FAMILY HISTORY:  His indicated that his mother is alive. He indicated that his father is alive.    SOCIAL HISTORY: He  reports that he has been smoking Cigarettes.  He has been smoking about 1.00 pack per day. He has never used smokeless tobacco. He reports that he does not drink alcohol or use drugs.  REVIEW OF SYSTEMS:   Constitutional: Negative for fever and chills.  HENT: Negative for congestion and rhinorrhea.  Eyes: Negative for redness and visual disturbance.  Respiratory: Negative for shortness of breath and wheezing.  Cardiovascular: Negative for chest pain and palpitations.  Gastrointestinal: Positive for nausea , vomiting and abdominal pain , but negative for Loose stools Genitourinary: Negative for dysuria and urgency.  Endocrine: Positive polyuria, polyphagia but negative for heat intolerance Musculoskeletal: Negative for myalgias and arthralgias.  Skin: Negative for pallor and wound.  Neurological: Negative for dizziness and headaches   SUBJECTIVE:   VITAL SIGNS: BP (!) 142/88   Pulse (!) 110   Temp 97.8 F (36.6 C)   Resp 19   Ht 6\' 2"  (1.88 m)   Wt 194 lb 10.7 oz (88.3 kg)   SpO2 100%   BMI 24.99 kg/m   HEMODYNAMICS:    VENTILATOR SETTINGS:    INTAKE / OUTPUT: I/O last 3 completed shifts: In: 1564.2 [P.O.:200; I.V.:364.2; IV Piggyback:1000] Out: 900 [Urine:900]  PHYSICAL  EXAMINATION: General:  Well-nourished, well-developed, no acute distress Neuro:  Alert and oriented 4, cranial nerves intact HEENT:  PERRLA, no JVD, trachea midline Cardiovascular:  RRR, S1, S2, no murmur, regurg or gallop, no edema Lungs:  Bilateral breath sounds, no wheezing Abdomen:  Soft, nontender, positive bowel sounds in all 4 quadrants Musculoskeletal:  Positive range  of motion in upper and lower extremities, no deformities Skin:  Warm and dry  LABS:  BMET  Recent Labs Lab 08/18/17 1952 08/18/17 2323 08/19/17 0324  NA 136 133* 134*  K 4.6 3.8 3.4*  CL 102 101 102  CO2 11* 19* 21*  BUN 19 14 10   CREATININE 1.38* 1.07 0.88  GLUCOSE 157* 116* 106*    Electrolytes  Recent Labs Lab 08/18/17 1952 08/18/17 2323 08/19/17 0324  CALCIUM 8.9 8.7* 8.3*  MG  --  1.8  --   PHOS  --  1.9*  --     CBC  Recent Labs Lab 08/18/17 1454 08/19/17 0324  WBC 21.7* 15.0*  HGB 14.3 12.1*  HCT 45.4 34.1*  PLT 427 266    Coag's No results for input(s): APTT, INR in the last 168 hours.  Sepsis Markers No results for input(s): LATICACIDVEN, PROCALCITON, O2SATVEN in the last 168 hours.  ABG No results for input(s): PHART, PCO2ART, PO2ART in the last 168 hours.  Liver Enzymes  Recent Labs Lab 08/18/17 1454 08/19/17 0324  AST 98* 68*  ALT 110* 78*  ALKPHOS 173* 123  BILITOT 2.7* 0.9  ALBUMIN 4.2 3.1*    Cardiac Enzymes No results for input(s): TROPONINI, PROBNP in the last 168 hours.  Glucose  Recent Labs Lab 08/18/17 2236 08/18/17 2339 08/19/17 0035 08/19/17 0225 08/19/17 0337 08/19/17 0445  GLUCAP 130* 122* 151* 102* 106* 237*    Imaging No results found.  SIGNIFICANT EVENTS: 10/28>admitted  LINES/TUBES: Peripheral IVs  DISCUSSION: 31 year old Caucasian male presenting with DKA secondary to nonadherence  ASSESSMENT Diabetic ketoacidosis Hyponatremia Medication nonadherence. Hypokalemia   PLAN Hemodynamic monitoring per ICU protocol DKA protocol. IV fluids Keep nothing by mouth except for clearly quits and low carb, low to fluids Monitor and correct electrolytes Care management consult for medication assistance  FAMILY  - Updates: Family at bedside. Updated when available  - Inter-disciplinary family meet or Palliative Care meeting due by:  day 7   Melayna Robarts S. [facility] ANP-BC Pulmonary and Critical  Care Medicine [facility] Pager [phone] or [phone]  08/19/2017, 5:02 AM

## 2017-08-20 DIAGNOSIS — F332 Major depressive disorder, recurrent severe without psychotic features: Secondary | ICD-10-CM | POA: Diagnosis not present

## 2017-08-20 LAB — BASIC METABOLIC PANEL
ANION GAP: 12 (ref 5–15)
BUN: 21 mg/dL — AB (ref 6–20)
CHLORIDE: 94 mmol/L — AB (ref 101–111)
CO2: 21 mmol/L — AB (ref 22–32)
Calcium: 8.7 mg/dL — ABNORMAL LOW (ref 8.9–10.3)
Creatinine, Ser: 0.85 mg/dL (ref 0.61–1.24)
GFR calc Af Amer: >60 mL/min (ref >60)
GFR calc non Af Amer: >60 mL/min (ref >60)
GLUCOSE: 393 mg/dL — AB (ref 65–99)
POTASSIUM: 4.1 mmol/L (ref 3.5–5.1)
Sodium: 127 mmol/L — ABNORMAL LOW (ref 135–145)

## 2017-08-20 LAB — GLUCOSE, CAPILLARY
GLUCOSE-CAPILLARY: 96 mg/dL (ref 65–99)
GLUCOSE-CAPILLARY: 111 mg/dL — AB (ref 65–99)
Glucose-Capillary: 238 mg/dL — ABNORMAL HIGH (ref 65–99)
Glucose-Capillary: 376 mg/dL — ABNORMAL HIGH (ref 65–99)
Glucose-Capillary: 182 mg/dL — ABNORMAL HIGH (ref 65–99)

## 2017-08-20 LAB — CBC
HEMATOCRIT: 37.2 % — AB (ref 40.0–52.0)
Hemoglobin: 12.6 g/dL — ABNORMAL LOW (ref 13.0–18.0)
MCH: 33 pg (ref 26.0–34.0)
MCHC: 34 g/dL (ref 32.0–36.0)
MCV: 97.1 fL (ref 80.0–100.0)
Platelets: 238 10*3/uL (ref 150–440)
RBC: 3.83 MIL/uL — AB (ref 4.40–5.90)
RDW: 15.1 % — ABNORMAL HIGH (ref 11.5–14.5)
WBC: 8.1 10*3/uL (ref 3.8–10.6)

## 2017-08-20 LAB — MAGNESIUM: Magnesium: 1.6 mg/dL — ABNORMAL LOW (ref 1.7–2.4)

## 2017-08-20 LAB — PHOSPHORUS: Phosphorus: 2.8 mg/dL (ref 2.5–4.6)

## 2017-08-20 LAB — HIV ANTIBODY (ROUTINE TESTING W REFLEX): HIV Screen 4th Generation wRfx: NONREACTIVE

## 2017-08-20 MED ORDER — MAGNESIUM SULFATE 2 GM/50ML IV SOLN
2.0000 g | Freq: Once | INTRAVENOUS | Status: AC
  Administered 2017-08-20: 2 g via INTRAVENOUS
  Filled 2017-08-20: qty 50

## 2017-08-20 MED ORDER — INSULIN ASPART 100 UNIT/ML ~~LOC~~ SOLN: 6.0000 [IU] | Freq: Three times a day (TID) | SUBCUTANEOUS | Status: DC

## 2017-08-20 MED ORDER — MIRTAZAPINE 15 MG PO TABS
7.5000 mg | ORAL_TABLET | Freq: Every day | ORAL | Status: DC
  Administered 2017-08-20: 7.5 mg via ORAL
  Filled 2017-08-20: qty 1

## 2017-08-20 NOTE — Progress Notes (Signed)
Inpatient Diabetes Program Recommendations  AACE/ADA: New Consensus Statement on Inpatient Glycemic Control (2015)  Target Ranges:  Prepandial:   less than 140 mg/dL      Peak postprandial:   less than 180 mg/dL (1-2 hours)      Critically ill patients:  140 - 180 mg/dL   Results for Derek JackWILSON, Dene Q (MRN [id number]) as of 08/20/2017 07:38  Ref. Range 08/19/2017 07:19 08/19/2017 11:23 08/19/2017 16:57 08/19/2017 20:59 08/20/2017 02:18  Glucose-Capillary Latest Ref Range: 65 - 99 mg/dL 79 784250 (H) 696212 (H) 295137 (H) 238 (H)   Review of Glycemic Control  Diabetes history: DM2 Outpatient Diabetes medications: Lantus 50 units QHS, Novolog 35 units QID Current orders for Inpatient glycemic control: Lantus 38 units daily, Novolog 0-9 units TID with meals, Novolog 0-5 units QHS, Novolog 4 units TID with meals  Inpatient Diabetes Program Recommendations:  Insulin - Basal: Noted Lantus increased to 38 units on 10/29 to start this morning. Insulin - Meal Coverage: Please consider increasing meal coverage to Novolog 6 units TID with meals. HgbA1C: Please add an A1C to blood in lab to evaluate glycemic control over the past 2-3 months.  Thanks, Orlando PennerMarie Sher Shampine, RN, MSN, CDE Diabetes Coordinator Inpatient Diabetes Program [phone] (Team Pager from 8am to 5pm)

## 2017-08-20 NOTE — Consult Note (Signed)
[place] [facility] Consult   Reason for Consult: Follow-up consult 31 year old man with history of major depression and diabetes Referring Physician: Vallarie Mare Patient Identification: Mark [name] MRN:  [id number] Principal Diagnosis: Severe recurrent major depression without psychotic features [facility]) Diagnosis:   Patient Active Problem List   Diagnosis Date Noted  . Severe recurrent major depression without psychotic features ([place]) [F33.2] 08/19/2017  . Nausea & vomiting [R11.2] 08/18/2017  . Abdominal pain [R10.9] 08/18/2017  . Hyponatremia [E87.1] 08/18/2017  . DKA (diabetic ketoacidoses) ([place]) [E13.10] 11/24/2015  . Diabetic keto-acidosis ([place]) [E13.10] 10/23/2015    Total Time spent with patient: 30 minutes  Subjective:   Mark [name] is a 31 y.o. male patient admitted with "I am still not feeling good".  HPI: Patient interviewed.  Continues to complain of depressed mood.  Passive suicidal thoughts without plan.  No complaints of current psychotic symptoms.  Patient is still feeling hopeless negative and down.  Physically still feeling weak and tired.  Blood sugars still running intermittently very high.  Poor sleep at night.  Poor appetite.  Patient remains socially isolated with no clear place to live  Family history: Positive for depression  Substance abuse history: None reported of any current substance abuse  Past Psychiatric History: Past history of multiple episodes of major depression and suicidality  Risk to Self: Is patient at risk for suicide?: No Risk to Others:   Prior Inpatient Therapy:   Prior Outpatient Therapy:    Past Medical History:  Past Medical History:  Diagnosis Date  . Diabetes mellitus without complication ([place])   . Hydrocephalus     Past Surgical History:  Procedure Laterality Date  . KIDNEY STONE SURGERY     Family History:  Family History  Problem Relation Age of Onset  . Breast cancer Mother    Family Psychiatric   History: Family history positive for depression Social History:  History  Alcohol Use No    Comment: ocasional      History  Drug Use No    Social History   Social History  . Marital status: Single    Spouse name: N/A  . Number of children: N/A  . Years of education: N/A   Social History Main Topics  . Smoking status: Current Every Day Smoker    Packs/day: 1.00    Types: Cigarettes  . Smokeless tobacco: Never Used  . Alcohol use No     Comment: ocasional   . Drug use: No  . Sexual activity: Not Asked   Other Topics Concern  . None   Social History Narrative  . None   Additional Social History:    Allergies:   Allergies  Allergen Reactions  . Penicillins Anaphylaxis, Hives and Swelling    Has patient had a PCN reaction causing immediate rash, facial/tongue/throat swelling, SOB or lightheadedness with hypotension: Yes Has patient had a PCN reaction causing severe rash involving mucus membranes or skin necrosis: No Has patient had a PCN reaction that required hospitalization Yes Has patient had a PCN reaction occurring within the last 10 years: Yes If all of the above answers are "NO", then may proceed with Cephalosporin use.  . Sulfa Antibiotics Anaphylaxis and Hives  . Clindamycin/Lincomycin Hives    Labs:  Results for orders placed or performed during the hospital encounter of 08/18/17 (from the past 48 hour(s))  MRSA PCR Screening     Status: None   Collection Time: 08/18/17  5:22 PM  Result Value Ref Range  MRSA by PCR NEGATIVE NEGATIVE    Comment:        The GeneXpert MRSA Assay (FDA approved for NASAL specimens only), is one component of a comprehensive MRSA colonization surveillance program. It is not intended to diagnose MRSA infection nor to guide or monitor treatment for MRSA infections.   Glucose, capillary     Status: Abnormal   Collection Time: 08/18/17  5:30 PM  Result Value Ref Range   Glucose-Capillary >600 (HH) 65 - 99 mg/dL   Glucose, capillary     Status: Abnormal   Collection Time: 08/18/17  6:33 PM  Result Value Ref Range   Glucose-Capillary 391 (H) 65 - 99 mg/dL  Glucose, capillary     Status: Abnormal   Collection Time: 08/18/17  7:34 PM  Result Value Ref Range   Glucose-Capillary 247 (H) 65 - 99 mg/dL   Comment 1 Notify RN    Comment 2 Document in Chart   Basic metabolic panel     Status: Abnormal   Collection Time: 08/18/17  7:52 PM  Result Value Ref Range   Sodium 136 135 - 145 mmol/L    Comment: RESULTS VERIFIED BY REPEAT TESTING   Potassium 4.6 3.5 - 5.1 mmol/L   Chloride 102 101 - 111 mmol/L   CO2 11 (L) 22 - 32 mmol/L   Glucose, Bld 157 (H) 65 - 99 mg/dL   BUN 19 6 - 20 mg/dL   Creatinine, Ser 1.38 (H) 0.61 - 1.24 mg/dL   Calcium 8.9 8.9 - 10.3 mg/dL   GFR calc non Af Amer >60 >60 mL/min   GFR calc Af Amer >60 >60 mL/min    Comment: (NOTE) The eGFR has been calculated using the CKD EPI equation. This calculation has not been validated in all clinical situations. eGFR's persistently <60 mL/min signify possible Chronic Kidney Disease.    Anion gap 23 (H) 5 - 15  Glucose, capillary     Status: Abnormal   Collection Time: 08/18/17  8:32 PM  Result Value Ref Range   Glucose-Capillary 187 (H) 65 - 99 mg/dL  Glucose, capillary     Status: Abnormal   Collection Time: 08/18/17  9:33 PM  Result Value Ref Range   Glucose-Capillary 187 (H) 65 - 99 mg/dL  Glucose, capillary     Status: Abnormal   Collection Time: 08/18/17 10:36 PM  Result Value Ref Range   Glucose-Capillary 130 (H) 65 - 99 mg/dL  Basic metabolic panel     Status: Abnormal   Collection Time: 08/18/17 11:23 PM  Result Value Ref Range   Sodium 133 (L) 135 - 145 mmol/L   Potassium 3.8 3.5 - 5.1 mmol/L   Chloride 101 101 - 111 mmol/L   CO2 19 (L) 22 - 32 mmol/L   Glucose, Bld 116 (H) 65 - 99 mg/dL   BUN 14 6 - 20 mg/dL   Creatinine, Ser 1.07 0.61 - 1.24 mg/dL   Calcium 8.7 (L) 8.9 - 10.3 mg/dL   GFR calc non Af Amer >60  >60 mL/min   GFR calc Af Amer >60 >60 mL/min    Comment: (NOTE) The eGFR has been calculated using the CKD EPI equation. This calculation has not been validated in all clinical situations. eGFR's persistently <60 mL/min signify possible Chronic Kidney Disease.    Anion gap 13 5 - 15  Magnesium     Status: None   Collection Time: 08/18/17 11:23 PM  Result Value Ref Range   Magnesium 1.8 1.7 -  2.4 mg/dL  Phosphorus     Status: Abnormal   Collection Time: 08/18/17 11:23 PM  Result Value Ref Range   Phosphorus 1.9 (L) 2.5 - 4.6 mg/dL  Glucose, capillary     Status: Abnormal   Collection Time: 08/18/17 11:39 PM  Result Value Ref Range   Glucose-Capillary 122 (H) 65 - 99 mg/dL  Glucose, capillary     Status: Abnormal   Collection Time: 08/19/17 12:35 AM  Result Value Ref Range   Glucose-Capillary 151 (H) 65 - 99 mg/dL  Glucose, capillary     Status: Abnormal   Collection Time: 08/19/17  2:25 AM  Result Value Ref Range   Glucose-Capillary 102 (H) 65 - 99 mg/dL   Comment 1 Notify RN    Comment 2 Document in Chart   Comprehensive metabolic panel     Status: Abnormal   Collection Time: 08/19/17  3:24 AM  Result Value Ref Range   Sodium 134 (L) 135 - 145 mmol/L   Potassium 3.4 (L) 3.5 - 5.1 mmol/L   Chloride 102 101 - 111 mmol/L   CO2 21 (L) 22 - 32 mmol/L   Glucose, Bld 106 (H) 65 - 99 mg/dL   BUN 10 6 - 20 mg/dL   Creatinine, Ser 0.88 0.61 - 1.24 mg/dL   Calcium 8.3 (L) 8.9 - 10.3 mg/dL   Total Protein 5.8 (L) 6.5 - 8.1 g/dL   Albumin 3.1 (L) 3.5 - 5.0 g/dL   AST 68 (H) 15 - 41 U/L   ALT 78 (H) 17 - 63 U/L   Alkaline Phosphatase 123 38 - 126 U/L   Total Bilirubin 0.9 0.3 - 1.2 mg/dL   GFR calc non Af Amer >60 >60 mL/min   GFR calc Af Amer >60 >60 mL/min    Comment: (NOTE) The eGFR has been calculated using the CKD EPI equation. This calculation has not been validated in all clinical situations. eGFR's persistently <60 mL/min signify possible Chronic Kidney Disease.     Anion gap 11 5 - 15  CBC     Status: Abnormal   Collection Time: 08/19/17  3:24 AM  Result Value Ref Range   WBC 15.0 (H) 3.8 - 10.6 K/uL   RBC 3.56 (L) 4.40 - 5.90 MIL/uL   Hemoglobin 12.1 (L) 13.0 - 18.0 g/dL   HCT 34.1 (L) 40.0 - 52.0 %   MCV 95.9 80.0 - 100.0 fL   MCH 34.1 (H) 26.0 - 34.0 pg   MCHC 35.6 32.0 - 36.0 g/dL   RDW 14.9 (H) 11.5 - 14.5 %   Platelets 266 150 - 440 K/uL  HIV antibody (Routine Testing)     Status: None   Collection Time: 08/19/17  3:24 AM  Result Value Ref Range   HIV Screen 4th Generation wRfx Non Reactive Non Reactive    Comment: (NOTE) Performed At: [facility] [address] [place], [place] [id number] Lindon Romp MD YO:[id number]   Glucose, capillary     Status: Abnormal   Collection Time: 08/19/17  3:37 AM  Result Value Ref Range   Glucose-Capillary 106 (H) 65 - 99 mg/dL  Glucose, capillary     Status: Abnormal   Collection Time: 08/19/17  4:45 AM  Result Value Ref Range   Glucose-Capillary 237 (H) 65 - 99 mg/dL  Glucose, capillary     Status: None   Collection Time: 08/19/17  7:19 AM  Result Value Ref Range   Glucose-Capillary 79 65 - 99 mg/dL  Glucose, capillary  Status: Abnormal   Collection Time: 08/19/17 11:23 AM  Result Value Ref Range   Glucose-Capillary 250 (H) 65 - 99 mg/dL  Glucose, capillary     Status: Abnormal   Collection Time: 08/19/17  4:57 PM  Result Value Ref Range   Glucose-Capillary 212 (H) 65 - 99 mg/dL   Comment 1 Notify RN   Glucose, capillary     Status: Abnormal   Collection Time: 08/19/17  8:59 PM  Result Value Ref Range   Glucose-Capillary 137 (H) 65 - 99 mg/dL  Glucose, capillary     Status: Abnormal   Collection Time: 08/20/17  2:18 AM  Result Value Ref Range   Glucose-Capillary 238 (H) 65 - 99 mg/dL   Comment 1 Notify RN   Glucose, capillary     Status: Abnormal   Collection Time: 08/20/17  7:52 AM  Result Value Ref Range   Glucose-Capillary 376 (H) 65 - 99 mg/dL   Comment 1  Notify RN   CBC     Status: Abnormal   Collection Time: 08/20/17  8:18 AM  Result Value Ref Range   WBC 8.1 3.8 - 10.6 K/uL   RBC 3.83 (L) 4.40 - 5.90 MIL/uL   Hemoglobin 12.6 (L) 13.0 - 18.0 g/dL   HCT 37.2 (L) 40.0 - 52.0 %   MCV 97.1 80.0 - 100.0 fL   MCH 33.0 26.0 - 34.0 pg   MCHC 34.0 32.0 - 36.0 g/dL   RDW 15.1 (H) 11.5 - 14.5 %   Platelets 238 150 - 440 K/uL  Basic metabolic panel     Status: Abnormal   Collection Time: 08/20/17  8:18 AM  Result Value Ref Range   Sodium 127 (L) 135 - 145 mmol/L   Potassium 4.1 3.5 - 5.1 mmol/L   Chloride 94 (L) 101 - 111 mmol/L   CO2 21 (L) 22 - 32 mmol/L   Glucose, Bld 393 (H) 65 - 99 mg/dL   BUN 21 (H) 6 - 20 mg/dL   Creatinine, Ser 0.85 0.61 - 1.24 mg/dL   Calcium 8.7 (L) 8.9 - 10.3 mg/dL   GFR calc non Af Amer >60 >60 mL/min   GFR calc Af Amer >60 >60 mL/min    Comment: (NOTE) The eGFR has been calculated using the CKD EPI equation. This calculation has not been validated in all clinical situations. eGFR's persistently <60 mL/min signify possible Chronic Kidney Disease.    Anion gap 12 5 - 15  Phosphorus     Status: None   Collection Time: 08/20/17  8:18 AM  Result Value Ref Range   Phosphorus 2.8 2.5 - 4.6 mg/dL  Magnesium     Status: Abnormal   Collection Time: 08/20/17  8:18 AM  Result Value Ref Range   Magnesium 1.6 (L) 1.7 - 2.4 mg/dL  Glucose, capillary     Status: None   Collection Time: 08/20/17 11:37 AM  Result Value Ref Range   Glucose-Capillary 96 65 - 99 mg/dL   Comment 1 Notify RN   Glucose, capillary     Status: Abnormal   Collection Time: 08/20/17  4:46 PM  Result Value Ref Range   Glucose-Capillary 111 (H) 65 - 99 mg/dL    Current Facility-Administered Medications  Medication Dose Route Frequency Provider Last Rate Last Dose  . acetaminophen (TYLENOL) tablet 650 mg  650 mg Oral Q6H PRN Idelle Crouch, MD   650 mg at 08/18/17 1755   Or  . acetaminophen (TYLENOL) suppository 650 mg  650 mg Rectal Q6H  PRN Idelle Crouch, MD      . alum & mag hydroxide-simeth (MAALOX/MYLANTA) 200-200-20 MG/5ML suspension 30 mL  30 mL Oral Q4H PRN Idelle Crouch, MD   30 mL at 08/18/17 1848  . bisacodyl (DULCOLAX) suppository 10 mg  10 mg Rectal Daily PRN Idelle Crouch, MD      . diphenhydrAMINE (BENADRYL) capsule 50 mg  50 mg Oral QHS PRN Mikael Spray, NP   50 mg at 08/18/17 2158  . docusate sodium (COLACE) capsule 100 mg  100 mg Oral BID Idelle Crouch, MD   100 mg at 08/20/17 0914  . gabapentin (NEURONTIN) capsule 600 mg  600 mg Oral TID Idelle Crouch, MD   600 mg at 08/20/17 1559  . heparin injection 5,000 Units  5,000 Units Subcutaneous Q8H Idelle Crouch, MD   5,000 Units at 08/20/17 1559  . insulin aspart (novoLOG) injection 0-5 Units  0-5 Units Subcutaneous QHS Flora Lipps, MD      . insulin aspart (novoLOG) injection 0-9 Units  0-9 Units Subcutaneous TID WC Flora Lipps, MD   9 Units at 08/20/17 0808  . insulin aspart (novoLOG) injection 6 Units  6 Units Subcutaneous TID WC Demetrios Loll, MD      . insulin glargine (LANTUS) injection 38 Units  38 Units Subcutaneous Daily Demetrios Loll, MD   38 Units at 08/20/17 0810  . ipratropium-albuterol (DUONEB) 0.5-2.5 (3) MG/3ML nebulizer solution 3 mL  3 mL Nebulization Q4H PRN Flora Lipps, MD      . LORazepam (ATIVAN) injection 0.5 mg  0.5 mg Intravenous Q4H PRN Idelle Crouch, MD      . nicotine (NICODERM CQ - dosed in mg/24 hours) patch 21 mg  21 mg Transdermal Daily Idelle Crouch, MD   21 mg at 08/20/17 0916  . ondansetron (ZOFRAN) tablet 4 mg  4 mg Oral Q6H PRN Idelle Crouch, MD       Or  . ondansetron [facility]) injection 4 mg  4 mg Intravenous Q6H PRN Idelle Crouch, MD   4 mg at 08/18/17 1755  . oxyCODONE-acetaminophen (PERCOCET/ROXICET) 5-325 MG per tablet 1 tablet  1 tablet Oral Q6H PRN Demetrios Loll, MD   1 tablet at 08/20/17 1559  . traZODone (DESYREL) tablet 100 mg  100 mg Oral QHS Dorene Sorrow S, NP   100 mg at  08/19/17 2115    Musculoskeletal: Strength & Muscle Tone: within normal limits Gait & Station: normal Patient leans: N/A  Psychiatric Specialty Exam: Physical Exam  Nursing note and vitals reviewed. Constitutional: He appears well-developed and well-nourished.  HENT:  Head: Normocephalic and atraumatic.  Eyes: Pupils are equal, round, and reactive to light. Conjunctivae are normal.  Neck: Normal range of motion.  Cardiovascular: Regular rhythm and normal heart sounds.   Respiratory: Effort normal and breath sounds normal. No respiratory distress.  GI: Soft. He exhibits no distension.  Musculoskeletal: Normal range of motion.  Neurological: He is alert.  Skin: Skin is warm and dry.  Psychiatric: Judgment normal. His affect is blunt. His speech is delayed. He is slowed. Cognition and memory are normal. He expresses suicidal ideation. He expresses no suicidal plans.    Review of Systems  Constitutional: Negative.   HENT: Negative.   Eyes: Negative.   Respiratory: Negative.   Cardiovascular: Negative.   Gastrointestinal: Negative.   Musculoskeletal: Negative.   Skin: Negative.   Neurological: Negative.   Psychiatric/Behavioral: Positive for depression,  memory loss and suicidal ideas. Negative for hallucinations and substance abuse. The patient is nervous/anxious and has insomnia.     Blood pressure 140/84, pulse 85, temperature 98.1 F (36.7 C), temperature source Oral, resp. rate 18, height 6' 2" (1.88 m), weight 93.9 kg (207 lb 0.2 oz), SpO2 98 %.Body mass index is 26.58 kg/m.  General Appearance: Disheveled  Eye Contact:  Fair  Speech:  Normal Rate  Volume:  Normal  Mood:  Dysphoric  Affect:  Depressed  Thought Process:  Goal Directed  Orientation:  Full (Time, Place, and Person)  Thought Content:  Logical  Suicidal Thoughts:  Yes.  without intent/plan  Homicidal Thoughts:  No  Memory:  Immediate;   Fair Recent;   Fair Remote;   Fair  Judgement:  Fair  Insight:   Fair  Psychomotor Activity:  Decreased  Concentration:  Concentration: Fair  Recall:  [employer] of Knowledge:  Fair  Language:  Fair  Akathisia:  No  Handed:  Right  AIMS (if indicated):     Assets:  Desire for Improvement Resilience  ADL's:  Impaired  Cognition:  Impaired,  Mild  Sleep:        Treatment Plan Summary: Daily contact with patient to assess and evaluate symptoms and progress in treatment, Medication management and Plan 31 year old man with major depression recurrent severe and diabetes.  Patient is being kept on the medical unit for another day for stabilization of blood sugars.  On interview today he states that he would be agreeable andWilling to go to the psychiatry ward for further stabilization.  Case reviewed with TTS.  I am going to start him on mirtazapine low-dose and we will keep an eye on him with a plan for likely transfer tomorrow  Disposition: Recommend psychiatric Inpatient admission when medically cleared.  Alethia Berthold, MD 08/20/2017 5:00 PM

## 2017-08-20 NOTE — Clinical Social Work Note (Signed)
CSW has not opened case at this time. Patient is continuing to receive medical workup and has been evaluated by psychiatry and recommendation has been made for patient to transfer to Behavioral Med when medically clear. [place] SpanielMonica Anylah [name] MSW,LCSW [phone]

## 2017-08-20 NOTE — Progress Notes (Signed)
[facility] [place] [facility]   PATIENT NAME: Mark [name]    MR#:  [id number]  DATE OF BIRTH:  1986-05-07  SUBJECTIVE:  CHIEF COMPLAINT:   Chief Complaint  Patient presents with  . Hyperglycemia   No abdominal pain and nausea, no vomiting. REVIEW OF SYSTEMS:  Review of Systems  Constitutional: Positive for malaise/fatigue. Negative for chills and fever.  HENT: Negative for sore throat.   Eyes: Negative for blurred vision and double vision.  Respiratory: Negative for cough, hemoptysis, shortness of breath, wheezing and stridor.   Cardiovascular: Negative for chest pain, palpitations, orthopnea and leg swelling.  Gastrointestinal: Negative for abdominal pain, blood in stool, diarrhea, melena, nausea and vomiting.  Genitourinary: Negative for dysuria, flank pain and hematuria.  Musculoskeletal: Negative for back pain and joint pain.  Neurological: Positive for weakness. Negative for dizziness, sensory change, focal weakness, seizures, loss of consciousness and headaches.  Endo/Heme/Allergies: Negative for polydipsia.  Psychiatric/Behavioral: Positive for depression. The patient is nervous/anxious.     DRUG ALLERGIES:   Allergies  Allergen Reactions  . Penicillins Anaphylaxis, Hives and Swelling    Has patient had a PCN reaction causing immediate rash, facial/tongue/throat swelling, SOB or lightheadedness with hypotension: Yes Has patient had a PCN reaction causing severe rash involving mucus membranes or skin necrosis: No Has patient had a PCN reaction that required hospitalization Yes Has patient had a PCN reaction occurring within the last 10 years: Yes If all of the above answers are "NO", then may proceed with Cephalosporin use.  . Sulfa Antibiotics Anaphylaxis and Hives  . Clindamycin/Lincomycin Hives   VITALS:  Blood pressure 140/84, pulse 85, temperature 98.1 F (36.7 C), temperature source Oral, resp. rate 18, height 6\' 2"  (1.88 m), weight  207 lb 0.2 oz (93.9 kg), SpO2 98 %. PHYSICAL EXAMINATION:  Physical Exam  Constitutional: He is oriented to person, place, and time and well-developed, well-nourished, and in no distress.  HENT:  Head: Normocephalic.  Mouth/Throat: Oropharynx is clear and moist.  Eyes: Pupils are equal, round, and reactive to light. Conjunctivae and EOM are normal. No scleral icterus.  Neck: Normal range of motion. Neck supple. No JVD present. No tracheal deviation present.  Cardiovascular: Normal rate, regular rhythm and normal heart sounds.  Exam reveals no gallop.   No murmur heard. Pulmonary/Chest: Effort normal and breath sounds normal. No respiratory distress. He has no wheezes. He has no rales.  Abdominal: Soft. Bowel sounds are normal. He exhibits no distension. There is tenderness. There is no rebound.  Musculoskeletal: Normal range of motion. He exhibits no edema or tenderness.  Neurological: He is alert and oriented to person, place, and time. No cranial nerve deficit.  Skin: No rash noted. No erythema.  Psychiatric: Affect normal.   LABORATORY PANEL:  Male CBC  Recent Labs Lab 08/20/17 0818  WBC 8.1  HGB 12.6*  HCT 37.2*  PLT 238   ------------------------------------------------------------------------------------------------------------------ Chemistries   Recent Labs Lab 08/19/17 0324 08/20/17 0818  NA 134* 127*  K 3.4* 4.1  CL 102 94*  CO2 21* 21*  GLUCOSE 106* 393*  BUN 10 21*  CREATININE 0.88 0.85  CALCIUM 8.3* 8.7*  MG  --  1.6*  AST 68*  --   ALT 78*  --   ALKPHOS 123  --   BILITOT 0.9  --    RADIOLOGY:  No results found. ASSESSMENT AND PLAN:    DKA (diabetic ketoacidoses) Resolved. Off insulin drip.  T1DM with hypoglycemia, Lantus 38  units daily, increase NovoLog to  6 units before meals and sliding scale.  Nausea & vomiting and Abdominal pain. Due to DKA, improved. Hyponatremia due to hyperglycemia. Treated with normal saline IV and follow-up  BMP.  Hypokalemia. Improved with potassium supplement. Leukocytosis. Due to reaction. Improved. Depression and anxiety. Per Dr. Toni Amendlapacs, defer starting any medication, reevaluate today. Tobacco abuse. Smoking cessation was counseled for 4 minutes.  All the records are reviewed and case discussed with Care Management/Social Worker. Management plans discussed with the patient, family and they are in agreement.  CODE STATUS: Full Code  TOTAL TIME TAKING CARE OF THIS PATIENT: 33 minutes.   More than 50% of the time was spent in counseling/coordination of care: YES  POSSIBLE D/C IN 1-2 DAYS, DEPENDING ON CLINICAL CONDITION.   Shaune Pollackhen, Cordie Beazley M.D on 08/20/2017 at 2:58 PM  Between 7am to 6pm - Pager - [phone]  After 6pm go to [email or web address] - Therapist, nutritionalpassword EPAS [facility]  [facility] [place] [facility]

## 2017-08-21 DIAGNOSIS — F332 Major depressive disorder, recurrent severe without psychotic features: Secondary | ICD-10-CM | POA: Diagnosis not present

## 2017-08-21 LAB — BASIC METABOLIC PANEL
Anion gap: 8 (ref 5–15)
BUN: 19 mg/dL (ref 6–20)
CALCIUM: 9 mg/dL (ref 8.9–10.3)
CO2: 26 mmol/L (ref 22–32)
Chloride: 100 mmol/L — ABNORMAL LOW (ref 101–111)
Creatinine, Ser: 0.75 mg/dL (ref 0.61–1.24)
GFR calc Af Amer: >60 mL/min (ref >60)
Glucose, Bld: 305 mg/dL — ABNORMAL HIGH (ref 65–99)
POTASSIUM: 4.4 mmol/L (ref 3.5–5.1)
SODIUM: 134 mmol/L — AB (ref 135–145)

## 2017-08-21 LAB — GLUCOSE, CAPILLARY
GLUCOSE-CAPILLARY: 205 mg/dL — AB (ref 65–99)
GLUCOSE-CAPILLARY: 120 mg/dL — AB (ref 65–99)
Glucose-Capillary: 398 mg/dL — ABNORMAL HIGH (ref 65–99)
Glucose-Capillary: 295 mg/dL — ABNORMAL HIGH (ref 65–99)

## 2017-08-21 LAB — MAGNESIUM: MAGNESIUM: 1.7 mg/dL (ref 1.7–2.4)

## 2017-08-21 MED ORDER — MIRTAZAPINE 7.5 MG PO TABS: 7.5000 mg | ORAL_TABLET | Freq: Every day | ORAL | Status: DC

## 2017-08-21 MED ORDER — TRAZODONE HCL 100 MG PO TABS: 100.0000 mg | ORAL_TABLET | Freq: Every day | ORAL | Status: DC

## 2017-08-21 MED ORDER — INSULIN GLARGINE 100 UNIT/ML ~~LOC~~ SOLN: 38.0000 [IU] | Freq: Every day | SUBCUTANEOUS | 11 refills | Status: DC

## 2017-08-21 MED ORDER — MIRTAZAPINE 15 MG PO TABS
15.0000 mg | ORAL_TABLET | Freq: Every day | ORAL | Status: DC
  Administered 2017-08-21: 15 mg via ORAL
  Filled 2017-08-21: qty 1

## 2017-08-21 NOTE — Progress Notes (Signed)
Inpatient Diabetes Program Recommendations  AACE/ADA: New Consensus Statement on Inpatient Glycemic Control (2015)  Target Ranges:  Prepandial:   less than 140 mg/dL      Peak postprandial:   less than 180 mg/dL (1-2 hours)      Critically ill patients:  140 - 180 mg/dL   Lab Results  Component Value Date   GLUCAP 182 (H) 08/20/2017   HGBA1C 9.2 (H) 11/25/2015    Review of Glycemic Control  Results for Mark [name], [name] (MRN [id number]) as of 08/21/2017 08:20  Ref. Range 08/20/2017 07:52 08/20/2017 11:37 08/20/2017 16:46 08/20/2017 21:17 08/21/2017 07:57  Glucose-Capillary Latest Ref Range: 65 - 99 mg/dL 409376 (H) 96 811111 (H) 914182 (H) 205 (H)    Diabetes history:DM2 Outpatient Diabetes medications: Lantus 50 units QHS, Novolog 35 units QID  Current orders for Inpatient glycemic control: Lantus 38 units daily, Novolog 0-9 units TID with meals, Novolog 0-5 units QHS, Novolog 6 units TID with meals  Inpatient Diabetes Program Recommendations: This patient received no insulin at either lunch or supper yesterday but there is documentation that he ate 100% at lunch.   Patient cannot remember if he ate at all yesterday although MD note states he has a poor appetite.  Patient denies poor appetite.  Consider holding mealtime Novolog today- I will follow. Continue Novolog correction as ordered.  Spoke with dining services to accurately document intake so we can make a more informed medication recommendation.    Per ADA recommendations "consider performing an A1C on all patients with diabetes or hyperglycemia admitted to the hospital if not performed in the prior 3 months".   Susette RacerJulie Celester Morgan, RN, BA, MHA, CDE Diabetes Coordinator Inpatient Diabetes Program  [phone] (Team Pager) [phone] [facility] Office) 08/21/2017 8:30 AM

## 2017-08-21 NOTE — Care Management (Signed)
Patient to discharge to behavorial medication.  RNCM will notify behavorial RNCM of potential need for medication assistance.

## 2017-08-21 NOTE — Discharge Summary (Addendum)
[facility] [place] [facility]   PATIENT NAME: Mark [name]    MR#:  [id number]  DATE OF BIRTH:  12/05/1985  DATE OF ADMISSION:  08/18/2017 ADMITTING PHYSICIAN: Marguarite ArbourJeffrey D Sparks, MD  DATE OF DISCHARGE: 10/31/20118  PRIMARY CARE PHYSICIAN: Patient, No Pcp Per    ADMISSION DIAGNOSIS:  Diabetic ketoacidosis without coma associated with type 1 diabetes mellitus (HCC) [E10.10]  DISCHARGE DIAGNOSIS:  Principal Problem:   Severe recurrent major depression without psychotic features (HCC) Active Problems:   DKA (diabetic ketoacidoses) (HCC)   Nausea & vomiting   Abdominal pain   Hyponatremia   SECONDARY DIAGNOSIS:   Past Medical History:  Diagnosis Date  . Diabetes mellitus without complication (HCC)   . Hydrocephalus     HOSPITAL COURSE:  31 year old male with known diabetes who presents in DKA.  1.  DKA: Patient was admitted to the stepdown unit on DKA protocol.  DKA has resolved.  He will continue his Lantus.  He was evaluated by diabetes nurse.  2.  Hypokalemia: This is improved with potassium supplementation  3.  Leukocytosis due to dehydration and this is improved 4.  Depression anxiety: Patient is on Remeron and trazodone as per psychiatry consultation.  He will need further psychiatric evaluation and will be discharged to behavioral health.  5. Tobacco dependence: Patient is encouraged to quit smoking. Counseling was provided for 4 minutes.    DISCHARGE CONDITIONS AND DIET:   Stable for discharge on diabetic diet  CONSULTS OBTAINED:  Treatment Team:  Clapacs, Jackquline DenmarkJohn T, MD  DRUG ALLERGIES:   Allergies  Allergen Reactions  . Penicillins Anaphylaxis, Hives and Swelling    Has patient had a PCN reaction causing immediate rash, facial/tongue/throat swelling, SOB or lightheadedness with hypotension: Yes Has patient had a PCN reaction causing severe rash involving mucus membranes or skin necrosis: No Has patient had a PCN reaction that  required hospitalization Yes Has patient had a PCN reaction occurring within the last 10 years: Yes If all of the above answers are "NO", then may proceed with Cephalosporin use.  . Sulfa Antibiotics Anaphylaxis and Hives  . Clindamycin/Lincomycin Hives    DISCHARGE MEDICATIONS:   Current Discharge Medication List    START taking these medications   Details  mirtazapine (REMERON) 15 MG tablet Take 1 tablet (15 mg total) by mouth at bedtime.    traZODone (DESYREL) 100 MG tablet Take 1 tablet (100 mg total) by mouth at bedtime.      CONTINUE these medications which have CHANGED   Details  insulin glargine (LANTUS) 100 UNIT/ML injection Inject 0.38 mLs (38 Units total) into the skin at bedtime. Qty: 10 mL, Refills: 11      CONTINUE these medications which have NOT CHANGED   Details  nicotine (NICODERM CQ - DOSED IN MG/24 HOURS) 21 mg/24hr patch Place 1 patch (21 mg total) onto the skin daily. Qty: 28 patch, Refills: 0      STOP taking these medications     insulin aspart (NOVOLOG) 100 UNIT/ML injection           Today   CHIEF COMPLAINT:  No acute issues overnight.   VITAL SIGNS:  Blood pressure 116/70, pulse 90, temperature 97.9 F (36.6 C), temperature source Oral, resp. rate 20, height 6\' 2"  (1.88 m), weight 91.9 kg (202 lb 9.6 oz), SpO2 100 %.   REVIEW OF SYSTEMS:  Review of Systems  Constitutional: Negative.  Negative for chills, fever and malaise/fatigue.  HENT: Negative.  Negative  for ear discharge, ear pain, hearing loss, nosebleeds and sore throat.   Eyes: Negative.  Negative for blurred vision and pain.  Respiratory: Negative.  Negative for cough, hemoptysis, shortness of breath and wheezing.   Cardiovascular: Negative.  Negative for chest pain, palpitations and leg swelling.  Gastrointestinal: Negative.  Negative for abdominal pain, blood in stool, diarrhea, nausea and vomiting.  Genitourinary: Negative.  Negative for dysuria.  Musculoskeletal:  Negative.  Negative for back pain.  Skin: Negative.   Neurological: Negative for dizziness, tremors, speech change, focal weakness, seizures and headaches.  Endo/Heme/Allergies: Negative.  Does not bruise/bleed easily.  Psychiatric/Behavioral: Positive for depression. Negative for hallucinations and suicidal ideas.     PHYSICAL EXAMINATION:  GENERAL:  31 y.o.-year-old patient lying in the bed with no acute distress.  NECK:  Supple, no jugular venous distention. No thyroid enlargement, no tenderness.  LUNGS: Normal breath sounds bilaterally, no wheezing, rales,rhonchi  No use of accessory muscles of respiration.  CARDIOVASCULAR: S1, S2 normal. No murmurs, rubs, or gallops.  ABDOMEN: Soft, non-tender, non-distended. Bowel sounds present. No organomegaly or mass.  EXTREMITIES: No pedal edema, cyanosis, or clubbing.  PSYCHIATRIC: The patient is alert and oriented x 3.  SKIN: No obvious rash, lesion, or ulcer.   DATA REVIEW:   CBC  Recent Labs Lab 08/20/17 0818  WBC 8.1  HGB 12.6*  HCT 37.2*  PLT 238    Chemistries   Recent Labs Lab 08/19/17 0324  08/21/17 0450  NA 134*  < > 134*  K 3.4*  < > 4.4  CL 102  < > 100*  CO2 21*  < > 26  GLUCOSE 106*  < > 305*  BUN 10  < > 19  CREATININE 0.88  < > 0.75  CALCIUM 8.3*  < > 9.0  MG  --   < > 1.7  AST 68*  --   --   ALT 78*  --   --   ALKPHOS 123  --   --   BILITOT 0.9  --   --   < > = values in this interval not displayed.  Cardiac Enzymes No results for input(s): TROPONINI in the last 168 hours.  Microbiology Results  @MICRORSLT48 @  RADIOLOGY:  No results found.    Current Discharge Medication List    START taking these medications   Details  mirtazapine (REMERON) 15 MG tablet Take 1 tablet (15 mg total) by mouth at bedtime.    traZODone (DESYREL) 100 MG tablet Take 1 tablet (100 mg total) by mouth at bedtime.      CONTINUE these medications which have CHANGED   Details  insulin glargine (LANTUS) 100  UNIT/ML injection Inject 0.38 mLs (38 Units total) into the skin at bedtime. Qty: 10 mL, Refills: 11      CONTINUE these medications which have NOT CHANGED   Details  nicotine (NICODERM CQ - DOSED IN MG/24 HOURS) 21 mg/24hr patch Place 1 patch (21 mg total) onto the skin daily. Qty: 28 patch, Refills: 0      STOP taking these medications     insulin aspart (NOVOLOG) 100 UNIT/ML injection            Management plans discussed with the patient and he is in agreement. Stable for discharge   Patient should follow up with psych  CODE STATUS:     Code Status Orders        Start     Ordered   08/18/17 1726  Full code  Continuous     08/18/17 1725    Code Status History    Date Active Date Inactive Code Status Order ID Comments User Context   11/24/2015  1:56 PM 11/25/2015  6:28 PM Full Code [id number]  Adrian Saran, MD Inpatient   10/23/2015 10:12 AM 10/24/2015  8:27 PM Full Code [id number]  Alford Highland, MD ED      TOTAL TIME TAKING CARE OF THIS PATIENT: 38 minutes.    Note: This dictation was prepared with Dragon dictation along with smaller phrase technology. Any transcriptional errors that result from this process are unintentional.  Jacinta Penalver M.D on 08/22/2017 at 8:09 AM  Between 7am to 6pm - Pager - [phone] After 6pm go to [email or web address] - password [email or web address]  [facility]  Office  [phone]  CC: Primary care physician; Patient, No Pcp Per

## 2017-08-21 NOTE — Consult Note (Signed)
[facility]iat[facility] Consult   Reason for Consult: Follow-up 31 year old man with major depression.  Patient has been on the medical unit for stabilization of his diabetic ketoacidosis and blood sugars.  According to notes he has been judged medically stable and ready for discharge from a medical standpoint.  Patient today tells me his mood is still down depressed and negative.  Suicidal ideation without specific plan.  General hopelessness.  Affect is flat blunted and withdrawn. Referring Physician:  Mody Patient Identification: Mark [name] MRN:  [id number] Principal Diagnosis: Severe recurrent major depression without psychotic features [facility]) Diagnosis:   Patient Active Problem List   Diagnosis Date Noted  . Severe recurrent major depression without psychotic features ([place]) [F33.2] 08/19/2017  . Nausea & vomiting [R11.2] 08/18/2017  . Abdominal pain [R10.9] 08/18/2017  . Hyponatremia [E87.1] 08/18/2017  . DKA (diabetic ketoacidoses) ([place]) [E13.10] 11/24/2015  . Diabetic keto-acidosis ([place]) [E13.10] 10/23/2015    Total Time spent with patient: 30 minutes  Subjective:   Mark [name] is a 31 y.o. male patient admitted with patient was admitted with severely abnormal blood sugars and diabetic ketoacidosis.  He was observed also be depressed and has given me a history consistent with severe recurrent major depression.  Patient is lacking resources and is not receiving outpatient follow-up.  High risk on multiple levels for suicide and poor outcome.  Has been compliant with treatment in the hospital..  HPI: See note above.  Came into the hospital with progressive dementia.  Patient has been cooperative with treatment and has been agreeable to the idea of admission to the psychiatric ward  Past Psychiatric History: Long-standing depression and multiple prior hospitalizations.  Positive for past suicide attempts.  Recent noncompliance with medication and treatment  Risk to Self:  Is patient at risk for suicide?: No Risk to Others:   Prior Inpatient Therapy:   Prior Outpatient Therapy:    Past Medical History:  Past Medical History:  Diagnosis Date  . Diabetes mellitus without complication ([place])   . Hydrocephalus     Past Surgical History:  Procedure Laterality Date  . KIDNEY STONE SURGERY     Family History:  Family History  Problem Relation Age of Onset  . Breast cancer Mother    Family Psychiatric  History: Positive for depression Social History:  History  Alcohol Use No    Comment: ocasional      History  Drug Use No    Social History   Social History  . Marital status: Single    Spouse name: N/A  . Number of children: N/A  . Years of education: N/A   Social History Main Topics  . Smoking status: Current Every Day Smoker    Packs/day: 1.00    Types: Cigarettes  . Smokeless tobacco: Never Used  . Alcohol use No     Comment: ocasional   . Drug use: No  . Sexual activity: Not Asked   Other Topics Concern  . None   Social History Narrative  . None   Additional Social History:    Allergies:   Allergies  Allergen Reactions  . Penicillins Anaphylaxis, Hives and Swelling    Has patient had a PCN reaction causing immediate rash, facial/tongue/throat swelling, SOB or lightheadedness with hypotension: Yes Has patient had a PCN reaction causing severe rash involving mucus membranes or skin necrosis: No Has patient had a PCN reaction that required hospitalization Yes Has patient had a PCN reaction occurring within the last 10 years: Yes  If all of the above answers are "NO", then may proceed with Cephalosporin use.  . Sulfa Antibiotics Anaphylaxis and Hives  . Clindamycin/Lincomycin Hives    Labs:  Results for orders placed or performed during the hospital encounter of 08/18/17 (from the past 48 hour(s))  Glucose, capillary     Status: Abnormal   Collection Time: 08/19/17  4:57 PM  Result Value Ref Range   Glucose-Capillary 212  (H) 65 - 99 mg/dL   Comment 1 Notify RN   Glucose, capillary     Status: Abnormal   Collection Time: 08/19/17  8:59 PM  Result Value Ref Range   Glucose-Capillary 137 (H) 65 - 99 mg/dL  Glucose, capillary     Status: Abnormal   Collection Time: 08/20/17  2:18 AM  Result Value Ref Range   Glucose-Capillary 238 (H) 65 - 99 mg/dL   Comment 1 Notify RN   Glucose, capillary     Status: Abnormal   Collection Time: 08/20/17  7:52 AM  Result Value Ref Range   Glucose-Capillary 376 (H) 65 - 99 mg/dL   Comment 1 Notify RN   CBC     Status: Abnormal   Collection Time: 08/20/17  8:18 AM  Result Value Ref Range   WBC 8.1 3.8 - 10.6 K/uL   RBC 3.83 (L) 4.40 - 5.90 MIL/uL   Hemoglobin 12.6 (L) 13.0 - 18.0 g/dL   HCT 37.2 (L) 40.0 - 52.0 %   MCV 97.1 80.0 - 100.0 fL   MCH 33.0 26.0 - 34.0 pg   MCHC 34.0 32.0 - 36.0 g/dL   RDW 15.1 (H) 11.5 - 14.5 %   Platelets 238 150 - 440 K/uL  Basic metabolic panel     Status: Abnormal   Collection Time: 08/20/17  8:18 AM  Result Value Ref Range   Sodium 127 (L) 135 - 145 mmol/L   Potassium 4.1 3.5 - 5.1 mmol/L   Chloride 94 (L) 101 - 111 mmol/L   CO2 21 (L) 22 - 32 mmol/L   Glucose, Bld 393 (H) 65 - 99 mg/dL   BUN 21 (H) 6 - 20 mg/dL   Creatinine, Ser 0.85 0.61 - 1.24 mg/dL   Calcium 8.7 (L) 8.9 - 10.3 mg/dL   GFR calc non Af Amer >60 >60 mL/min   GFR calc Af Amer >60 >60 mL/min    Comment: (NOTE) The eGFR has been calculated using the CKD EPI equation. This calculation has not been validated in all clinical situations. eGFR's persistently <60 mL/min signify possible Chronic Kidney Disease.    Anion gap 12 5 - 15  Phosphorus     Status: None   Collection Time: 08/20/17  8:18 AM  Result Value Ref Range   Phosphorus 2.8 2.5 - 4.6 mg/dL  Magnesium     Status: Abnormal   Collection Time: 08/20/17  8:18 AM  Result Value Ref Range   Magnesium 1.6 (L) 1.7 - 2.4 mg/dL  Glucose, capillary     Status: None   Collection Time: 08/20/17 11:37 AM   Result Value Ref Range   Glucose-Capillary 96 65 - 99 mg/dL   Comment 1 Notify RN   Glucose, capillary     Status: Abnormal   Collection Time: 08/20/17  4:46 PM  Result Value Ref Range   Glucose-Capillary 111 (H) 65 - 99 mg/dL  Glucose, capillary     Status: Abnormal   Collection Time: 08/20/17  9:17 PM  Result Value Ref Range   Glucose-Capillary 182 (  H) 65 - 99 mg/dL  Magnesium     Status: None   Collection Time: 08/21/17  4:50 AM  Result Value Ref Range   Magnesium 1.7 1.7 - 2.4 mg/dL  Basic metabolic panel     Status: Abnormal   Collection Time: 08/21/17  4:50 AM  Result Value Ref Range   Sodium 134 (L) 135 - 145 mmol/L   Potassium 4.4 3.5 - 5.1 mmol/L   Chloride 100 (L) 101 - 111 mmol/L   CO2 26 22 - 32 mmol/L   Glucose, Bld 305 (H) 65 - 99 mg/dL   BUN 19 6 - 20 mg/dL   Creatinine, Ser 0.75 0.61 - 1.24 mg/dL   Calcium 9.0 8.9 - 10.3 mg/dL   GFR calc non Af Amer >60 >60 mL/min   GFR calc Af Amer >60 >60 mL/min    Comment: (NOTE) The eGFR has been calculated using the CKD EPI equation. This calculation has not been validated in all clinical situations. eGFR's persistently <60 mL/min signify possible Chronic Kidney Disease.    Anion gap 8 5 - 15  Glucose, capillary     Status: Abnormal   Collection Time: 08/21/17  7:57 AM  Result Value Ref Range   Glucose-Capillary 205 (H) 65 - 99 mg/dL   Comment 1 Notify RN   Glucose, capillary     Status: Abnormal   Collection Time: 08/21/17 11:50 AM  Result Value Ref Range   Glucose-Capillary 120 (H) 65 - 99 mg/dL   Comment 1 Notify RN     Current Facility-Administered Medications  Medication Dose Route Frequency Provider Last Rate Last Dose  . acetaminophen (TYLENOL) tablet 650 mg  650 mg Oral Q6H PRN Idelle Crouch, MD   650 mg at 08/18/17 1755   Or  . acetaminophen (TYLENOL) suppository 650 mg  650 mg Rectal Q6H PRN Idelle Crouch, MD      . alum & mag hydroxide-simeth (MAALOX/MYLANTA) 200-200-20 MG/5ML suspension 30  mL  30 mL Oral Q4H PRN Idelle Crouch, MD   30 mL at 08/18/17 1848  . bisacodyl (DULCOLAX) suppository 10 mg  10 mg Rectal Daily PRN Idelle Crouch, MD      . diphenhydrAMINE (BENADRYL) capsule 50 mg  50 mg Oral QHS PRN Mikael Spray, NP   50 mg at 08/18/17 2158  . docusate sodium (COLACE) capsule 100 mg  100 mg Oral BID Idelle Crouch, MD   100 mg at 08/21/17 1109  . gabapentin (NEURONTIN) capsule 600 mg  600 mg Oral TID Idelle Crouch, MD   600 mg at 08/21/17 1526  . heparin injection 5,000 Units  5,000 Units Subcutaneous Q8H Idelle Crouch, MD   5,000 Units at 08/21/17 0459  . insulin aspart (novoLOG) injection 0-5 Units  0-5 Units Subcutaneous QHS Kasa, Kurian, MD      . insulin aspart (novoLOG) injection 0-9 Units  0-9 Units Subcutaneous TID WC Flora Lipps, MD   3 Units at 08/21/17 [phone]  . insulin glargine (LANTUS) injection 38 Units  38 Units Subcutaneous Daily Demetrios Loll, MD   38 Units at 08/20/17 0810  . ipratropium-albuterol (DUONEB) 0.5-2.5 (3) MG/3ML nebulizer solution 3 mL  3 mL Nebulization Q4H PRN Flora Lipps, MD      . LORazepam (ATIVAN) injection 0.5 mg  0.5 mg Intravenous Q4H PRN Idelle Crouch, MD      . mirtazapine (REMERON) tablet 7.5 mg  7.5 mg Oral QHS Vernita Tague, Madie Reno, MD  7.5 mg at 08/20/17 2127  . nicotine (NICODERM CQ - dosed in mg/24 hours) patch 21 mg  21 mg Transdermal Daily Idelle Crouch, MD   21 mg at 08/21/17 1112  . ondansetron (ZOFRAN) tablet 4 mg  4 mg Oral Q6H PRN Idelle Crouch, MD       Or  . ondansetron [facility]) injection 4 mg  4 mg Intravenous Q6H PRN Idelle Crouch, MD   4 mg at 08/18/17 1755  . oxyCODONE-acetaminophen (PERCOCET/ROXICET) 5-325 MG per tablet 1 tablet  1 tablet Oral Q6H PRN Demetrios Loll, MD   1 tablet at 08/21/17 1526  . traZODone (DESYREL) tablet 100 mg  100 mg Oral QHS Dorene Sorrow S, NP   100 mg at 08/20/17 2127    Musculoskeletal: Strength & Muscle Tone: within normal limits Gait & Station:  normal Patient leans: N/A  Psychiatric Specialty Exam: Physical Exam  Nursing note and vitals reviewed. Constitutional: He appears well-developed and well-nourished.  HENT:  Head: Normocephalic and atraumatic.  Eyes: Pupils are equal, round, and reactive to light. Conjunctivae are normal.  Neck: Normal range of motion.  Cardiovascular: Regular rhythm and normal heart sounds.   Respiratory: Effort normal. No respiratory distress.  GI: Soft.  Musculoskeletal: Normal range of motion.  Neurological: He is alert.  Skin: Skin is warm and dry.  Psychiatric: Judgment normal. His affect is blunt. His speech is delayed. He is slowed. Cognition and memory are normal. He exhibits a depressed mood. He expresses suicidal ideation. He expresses no suicidal plans.    Review of Systems  Constitutional: Negative.   HENT: Negative.   Eyes: Negative.   Respiratory: Negative.   Cardiovascular: Negative.   Gastrointestinal: Negative.   Musculoskeletal: Negative.   Skin: Negative.   Neurological: Negative.   Psychiatric/Behavioral: Positive for depression and suicidal ideas. Negative for hallucinations, memory loss and substance abuse. The patient is nervous/anxious and has insomnia.     Blood pressure 130/72, pulse 80, temperature 98.3 F (36.8 C), temperature source Oral, resp. rate 16, height 6' 2"  (0.17 m), weight 93.9 kg (207 lb 0.2 oz), SpO2 99 %.Body mass index is 26.58 kg/m.  General Appearance: Disheveled  Eye Contact:  Fair  Speech:  Slow  Volume:  Decreased  Mood:  Depressed and Dysphoric  Affect:  Constricted  Thought Process:  Coherent  Orientation:  Full (Time, Place, and Person)  Thought Content:  Logical  Suicidal Thoughts:  Yes.  without intent/plan  Homicidal Thoughts:  No  Memory:  Immediate;   Fair Recent;   Fair Remote;   Fair  Judgement:  Fair  Insight:  Fair  Psychomotor Activity:  Decreased  Concentration:  Concentration: Fair  Recall:  [employer] of Knowledge:   Fair  Language:  Fair  Akathisia:  No  Handed:  Right  AIMS (if indicated):     Assets:  Desire for Improvement Resilience  ADL's:  Intact  Cognition:  Impaired,  Mild  Sleep:        Treatment Plan Summary: Daily contact with patient to assess and evaluate symptoms and progress in treatment, Medication management and Plan 31 year old man with severe recurrent major depression with some melancholic features and suicidal ideation who lacks resources are no established outpatient care.  Patient is at high risk for suicide.  He has reasonably good insight and is agreeable to treatment.  Unfortunately nursing staff levels prohibit admission downstairs today.  Patient was very disappointed about this but is willing to stay in  the hospital awaiting admission.  Case reviewed with TTS.  I will go ahead and put in transfer orders so that if a bed becomes available tomorrow he will be able to go as soon as possible.  Increased dose of mirtazapine slightly for depression today.  Disposition: Recommend psychiatric Inpatient admission when medically cleared. Supportive therapy provided about ongoing stressors.  Alethia Berthold, MD 08/21/2017 3:36 PM

## 2017-08-22 ENCOUNTER — Inpatient Hospital Stay
Admission: AD | Admit: 2017-08-22 | Discharge: 2017-08-29 | DRG: 885 | Disposition: A | Discharge status: Home / Self Care | Payer: No Typology Code available for payment source | Source: Intra-hospital | Attending: Psychiatry | Admitting: Psychiatry

## 2017-08-22 DIAGNOSIS — E1065 Type 1 diabetes mellitus with hyperglycemia: Secondary | ICD-10-CM | POA: Diagnosis present

## 2017-08-22 DIAGNOSIS — F17210 Nicotine dependence, cigarettes, uncomplicated: Secondary | ICD-10-CM | POA: Diagnosis present

## 2017-08-22 DIAGNOSIS — R45851 Suicidal ideations: Secondary | ICD-10-CM | POA: Diagnosis present

## 2017-08-22 DIAGNOSIS — F329 Major depressive disorder, single episode, unspecified: Secondary | ICD-10-CM | POA: Diagnosis present

## 2017-08-22 DIAGNOSIS — E1040 Type 1 diabetes mellitus with diabetic neuropathy, unspecified: Secondary | ICD-10-CM | POA: Diagnosis present

## 2017-08-22 DIAGNOSIS — F333 Major depressive disorder, recurrent, severe with psychotic symptoms: Secondary | ICD-10-CM | POA: Diagnosis present

## 2017-08-22 DIAGNOSIS — F332 Major depressive disorder, recurrent severe without psychotic features: Principal | ICD-10-CM

## 2017-08-22 DIAGNOSIS — G4700 Insomnia, unspecified: Secondary | ICD-10-CM | POA: Diagnosis present

## 2017-08-22 DIAGNOSIS — Z794 Long term (current) use of insulin: Secondary | ICD-10-CM

## 2017-08-22 DIAGNOSIS — Z915 Personal history of self-harm: Secondary | ICD-10-CM | POA: Diagnosis not present

## 2017-08-22 LAB — GLUCOSE, CAPILLARY
Glucose-Capillary: 153 mg/dL — ABNORMAL HIGH (ref 65–99)
Glucose-Capillary: 131 mg/dL — ABNORMAL HIGH (ref 65–99)
Glucose-Capillary: 409 mg/dL — ABNORMAL HIGH (ref 65–99)
Glucose-Capillary: 182 mg/dL — ABNORMAL HIGH (ref 65–99)

## 2017-08-22 MED ORDER — GABAPENTIN 300 MG PO CAPS: 600.0000 mg | ORAL_CAPSULE | Freq: Three times a day (TID) | ORAL | Status: DC

## 2017-08-22 MED ORDER — INSULIN ASPART 100 UNIT/ML ~~LOC~~ SOLN
0.0000 [IU] | Freq: Every day | SUBCUTANEOUS | Status: DC
  Administered 2017-08-23 – 2017-08-24 (×2): 5 [IU] via SUBCUTANEOUS
  Administered 2017-08-25 – 2017-08-26 (×2): 2 [IU] via SUBCUTANEOUS
  Administered 2017-08-28: 4 [IU] via SUBCUTANEOUS
  Filled 2017-08-22 (×5): qty 1

## 2017-08-22 MED ORDER — DIPHENHYDRAMINE HCL 25 MG PO CAPS: 50.0000 mg | ORAL_CAPSULE | Freq: Every evening | ORAL | Status: DC | PRN

## 2017-08-22 MED ORDER — ACETAMINOPHEN 650 MG RE SUPP
650.0000 mg | Freq: Four times a day (QID) | RECTAL | Status: DC | PRN
  Filled 2017-08-22: qty 1

## 2017-08-22 MED ORDER — ACETAMINOPHEN 325 MG PO TABS: 650.0000 mg | ORAL_TABLET | Freq: Four times a day (QID) | ORAL | Status: DC | PRN

## 2017-08-22 MED ORDER — ONDANSETRON HCL 4 MG/2ML IJ SOLN: 4.0000 mg | Freq: Four times a day (QID) | INTRAMUSCULAR | Status: DC | PRN

## 2017-08-22 MED ORDER — DOCUSATE SODIUM 100 MG PO CAPS
100.0000 mg | ORAL_CAPSULE | Freq: Two times a day (BID) | ORAL | Status: DC
  Administered 2017-08-22 – 2017-08-29 (×14): 100 mg via ORAL
  Filled 2017-08-22 (×16): qty 1

## 2017-08-22 MED ORDER — HYDROXYZINE HCL 50 MG PO TABS
50.0000 mg | ORAL_TABLET | Freq: Three times a day (TID) | ORAL | Status: DC | PRN
  Administered 2017-08-24 – 2017-08-29 (×7): 50 mg via ORAL
  Filled 2017-08-22 (×8): qty 1

## 2017-08-22 MED ORDER — GABAPENTIN 300 MG PO CAPS
900.0000 mg | ORAL_CAPSULE | Freq: Three times a day (TID) | ORAL | Status: DC
  Administered 2017-08-22 – 2017-08-25 (×8): 900 mg via ORAL
  Filled 2017-08-22 (×8): qty 3

## 2017-08-22 MED ORDER — INSULIN ASPART 100 UNIT/ML ~~LOC~~ SOLN
3.0000 [IU] | Freq: Three times a day (TID) | SUBCUTANEOUS | Status: DC
  Administered 2017-08-22 – 2017-08-24 (×6): 3 [IU] via SUBCUTANEOUS
  Filled 2017-08-22 (×6): qty 1

## 2017-08-22 MED ORDER — ALUM & MAG HYDROXIDE-SIMETH 200-200-20 MG/5ML PO SUSP: 30.0000 mL | ORAL | Status: DC | PRN

## 2017-08-22 MED ORDER — OLANZAPINE 5 MG PO TABS
5.0000 mg | ORAL_TABLET | Freq: Four times a day (QID) | ORAL | Status: DC | PRN
  Administered 2017-08-22 – 2017-08-28 (×6): 5 mg via ORAL
  Filled 2017-08-22 (×6): qty 1

## 2017-08-22 MED ORDER — ONDANSETRON HCL 4 MG PO TABS
4.0000 mg | ORAL_TABLET | Freq: Four times a day (QID) | ORAL | Status: DC | PRN
  Filled 2017-08-22: qty 1

## 2017-08-22 MED ORDER — BISACODYL 10 MG RE SUPP
10.0000 mg | Freq: Every day | RECTAL | Status: DC | PRN
  Filled 2017-08-22: qty 1

## 2017-08-22 MED ORDER — MAGNESIUM HYDROXIDE 400 MG/5ML PO SUSP: 30.0000 mL | Freq: Every day | ORAL | Status: DC | PRN

## 2017-08-22 MED ORDER — MIRTAZAPINE 15 MG PO TABS: 15.0000 mg | ORAL_TABLET | Freq: Every day | ORAL | Status: DC

## 2017-08-22 MED ORDER — TRAZODONE HCL 100 MG PO TABS
100.0000 mg | ORAL_TABLET | Freq: Every day | ORAL | Status: DC
  Administered 2017-08-22 – 2017-08-26 (×5): 100 mg via ORAL
  Filled 2017-08-22 (×5): qty 1

## 2017-08-22 MED ORDER — NICOTINE 21 MG/24HR TD PT24
21.0000 mg | MEDICATED_PATCH | Freq: Every day | TRANSDERMAL | Status: DC
  Administered 2017-08-23 – 2017-08-29 (×7): 21 mg via TRANSDERMAL
  Filled 2017-08-22 (×7): qty 1

## 2017-08-22 MED ORDER — MIRTAZAPINE 15 MG PO TABS
15.0000 mg | ORAL_TABLET | Freq: Every day | ORAL | Status: DC
  Administered 2017-08-22 – 2017-08-26 (×5): 15 mg via ORAL
  Filled 2017-08-22 (×5): qty 1

## 2017-08-22 MED ORDER — INSULIN ASPART 100 UNIT/ML ~~LOC~~ SOLN
0.0000 [IU] | Freq: Three times a day (TID) | SUBCUTANEOUS | Status: DC
  Administered 2017-08-22 – 2017-08-23 (×2): 9 [IU] via SUBCUTANEOUS
  Administered 2017-08-23: 3 [IU] via SUBCUTANEOUS
  Administered 2017-08-24: 9 [IU] via SUBCUTANEOUS
  Administered 2017-08-24: 7 [IU] via SUBCUTANEOUS
  Administered 2017-08-24: 3 [IU] via SUBCUTANEOUS
  Administered 2017-08-25: 9 [IU] via SUBCUTANEOUS
  Administered 2017-08-26: 1 [IU] via SUBCUTANEOUS
  Administered 2017-08-26: 3 [IU] via SUBCUTANEOUS
  Administered 2017-08-26: 9 [IU] via SUBCUTANEOUS
  Administered 2017-08-27: 7 [IU] via SUBCUTANEOUS
  Administered 2017-08-27: 3 [IU] via SUBCUTANEOUS
  Administered 2017-08-28: 9 [IU] via SUBCUTANEOUS
  Administered 2017-08-29: 2 [IU] via SUBCUTANEOUS
  Administered 2017-08-29: 1 [IU] via SUBCUTANEOUS
  Filled 2017-08-22 (×10): qty 1

## 2017-08-22 NOTE — BH Assessment (Signed)
Pt pending bmu bed assignment.

## 2017-08-22 NOTE — Progress Notes (Signed)
Patient admitted from medical floor.Patient was little unsteady on his feet,states "I am like this because  I was in the bed for few days."Patient got upset that there is no TV in his room.Patient started yelling and cursing.Asking staff "give me a gun I can shoot myself."Tried to calm down and made comfortable patient in the room.Skin assessment and body search done.No contraband found.

## 2017-08-22 NOTE — H&P (Signed)
Psychiatric Admission Assessment Adult  Patient Identification: ALA CAPRI MRN:  [id number] Date of Evaluation:  08/22/2017 Chief Complaint:  depression Principal Diagnosis: Severe recurrent major depression without psychotic features [facility]) Diagnosis:   Patient Active Problem List   Diagnosis Date Noted  . MDD (major depressive disorder) [F32.9] 08/22/2017  . Severe recurrent major depression without psychotic features ([place]) [F33.2] 08/19/2017  . Nausea & vomiting [R11.2] 08/18/2017  . Abdominal pain [R10.9] 08/18/2017  . Hyponatremia [E87.1] 08/18/2017  . DKA (diabetic ketoacidoses) ([place]) [E13.10] 11/24/2015  . Diabetic keto-acidosis ([place]) [E13.10] 10/23/2015   History of Present Illness: 31 yo male with long history of depression and suicidal thoughts. He initially presented to the ED for extremely elevated blood sugars and found to be in DKA and was admitted medically. Psychiatry was consulted for depression and SI and recommended inpatient admission. Pt is very irritable when he arrives on the unit. He states that he was getting percocet on the unit and is demanding this. HE is also angry because there is not TV in each room. He states, "This is an awful hospital. [facility] had TVs. This place wants to make me kill myself even more." Pt states that he has been extremely depressed for at least 5-6 years and cannot remember a time that he was happy. He states that he has suicidal thoughts "Everyday." He states that depression has been worsening for the past year after his mother passed away from breast cancer. He states that he was the primary caregiver for her and has been grieving that. He states that his best friend passed away  Last week by a heroin overdose. He states that he has no other support or family. He states that he has been isolating and states, "I just want to be alone. I don't need friends." He states, "I might as well just kill myself. I'm scared that I will really do it when I  get out." He does not have a plan of how he would do it at this time. Pt states that he has tried multiple anti-depressants and mood stabilizer in the past and nothing has helped "except sedatives like Klonopin." He states that has trouble sleeping and wakes up a lot with nightmares about his friend who died. He denies AH or VH. He reports very vague history of manic symptoms. He states that he was diagnosis with bipolar disorder when he was 12 because "I was out of control and rebelling and running away all the time." He reports "too many to count" inpatient hospitalizations for suicidal thoughts. He also reports many suicide attempts in the past including cutting his wrists in 2008. He does not have any outpatient providers. Pt is very irritable and perseverative on medications and wanting narcotics for pain. While he states that he does not want to live he is extremely worried about his blood sugars and wanting them to be controlled. He would be open to looking into therapy when he leaves the hospital.   Associated Signs/Symptoms: Depression Symptoms:  depressed mood, anhedonia, insomnia, psychomotor retardation, fatigue, feelings of worthlessness/guilt, difficulty concentrating, hopelessness, suicidal thoughts without plan, (Hypo) Manic Symptoms:  denies Anxiety Symptoms:  Excessive Worry, Psychotic Symptoms:  Denies PTSD Symptoms: Negative Total Time spent with patient: 45 minutes  Past Psychiatric History: Pt reports history of bipolar disorder at age 38. He does not have outpatient providers or therapist. He reports "too many to count" hospitalizations for suicidal thoughts. Last was 1 year ago at [facility]. He reports multiple suicide  attempts in the past including cutting his wrists in 2008. He reports trying "every medication possible" in the past. But unable to name.   Is the patient at risk to self? Yes.    Has the patient been a risk to self in the past 6 months? Yes.    Has the patient  been a risk to self within the distant past? Yes.    Is the patient a risk to others? No.  Has the patient been a risk to others in the past 6 months? No.  Has the patient been a risk to others within the distant past? No.   Alcohol Screening: 1. How often do you have a drink containing alcohol?: Monthly or less 2. How many drinks containing alcohol do you have on a typical day when you are drinking?: 1 or 2 3. How often do you have six or more drinks on one occasion?: Never Preliminary Score: 0 4. How often during the last year have you found that you were not able to stop drinking once you had started?: Never 5. How often during the last year have you failed to do what was normally expected from you becasue of drinking?: Never 6. How often during the last year have you needed a first drink in the morning to get yourself going after a heavy drinking session?: Never 7. How often during the last year have you had a feeling of guilt of remorse after drinking?: Never 8. How often during the last year have you been unable to remember what happened the night before because you had been drinking?: Never 9. Have you or someone else been injured as a result of your drinking?: No 10. Has a relative or friend or a doctor or another health worker been concerned about your drinking or suggested you cut down?: No Alcohol Use Disorder Identification Test Final Score (AUDIT): 1 Brief Intervention: AUDIT score less than 7 or less-screening does not suggest unhealthy drinking-brief intervention not indicated Substance Abuse History in the last 12 months:  Yes.  , daily marijuana use. Denies other drug use and denies alcohol use Consequences of Substance Abuse: Negative Previous Psychotropic Medications: Yes  Psychological Evaluations: Yes  Past Medical History:  Past Medical History:  Diagnosis Date  . Diabetes mellitus without complication ([place])   . Hydrocephalus     Past Surgical History:  Procedure  Laterality Date  . KIDNEY STONE SURGERY     Family History:  Family History  Problem Relation Age of Onset  . Breast cancer Mother    Family Psychiatric  History: Unknown Tobacco Screening: Have you used any form of tobacco in the last 30 days? (Cigarettes, Smokeless Tobacco, Cigars, and/or Pipes): Yes Tobacco use, Select all that apply: 5 or more cigarettes per day Are you interested in Tobacco Cessation Medications?: No, patient refused Counseled patient on smoking cessation including recognizing danger situations, developing coping skills and basic information about quitting provided: Yes Social History:  From [place]. He currently lives in a boarding house where he pays rent. He has been there for 8 months and enjoys it there. He is in the process of applying for disability. 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 He denies having any support system.  Allergies:   Allergies  Allergen Reactions  . Penicillins Anaphylaxis, Hives and Swelling    Has patient had a PCN reaction causing immediate rash, facial/tongue/throat swelling, SOB or lightheadedness with hypotension: Yes Has patient had a PCN reaction  causing severe rash involving mucus membranes or skin necrosis: No Has patient had a PCN reaction that required hospitalization Yes Has patient had a PCN reaction occurring within the last 10 years: Yes If all of the above answers are "NO", then may proceed with Cephalosporin use.  . Sulfa Antibiotics Anaphylaxis and Hives  . Clindamycin/Lincomycin Hives   Lab Results:  Results for orders placed or performed during the hospital encounter of 08/18/17 (from the past 48 hour(s))  Glucose, capillary     Status: Abnormal   Collection Time: 08/20/17  4:46 PM  Result Value Ref Range   Glucose-Capillary 111 (H) 65 - 99 mg/dL  Glucose, capillary     Status: Abnormal   Collection Time: 08/20/17  9:17 PM  Result Value Ref Range   Glucose-Capillary 182 (H) 65 -  99 mg/dL  Magnesium     Status: None   Collection Time: 08/21/17  4:50 AM  Result Value Ref Range   Magnesium 1.7 1.7 - 2.4 mg/dL  Basic metabolic panel     Status: Abnormal   Collection Time: 08/21/17  4:50 AM  Result Value Ref Range   Sodium 134 (L) 135 - 145 mmol/L   Potassium 4.4 3.5 - 5.1 mmol/L   Chloride 100 (L) 101 - 111 mmol/L   CO2 26 22 - 32 mmol/L   Glucose, Bld 305 (H) 65 - 99 mg/dL   BUN 19 6 - 20 mg/dL   Creatinine, Ser 0.75 0.61 - 1.24 mg/dL   Calcium 9.0 8.9 - 10.3 mg/dL   GFR calc non Af Amer >60 >60 mL/min   GFR calc Af Amer >60 >60 mL/min    Comment: (NOTE) The eGFR has been calculated using the CKD EPI equation. This calculation has not been validated in all clinical situations. eGFR's persistently <60 mL/min signify possible Chronic Kidney Disease.    Anion gap 8 5 - 15  Glucose, capillary     Status: Abnormal   Collection Time: 08/21/17  7:57 AM  Result Value Ref Range   Glucose-Capillary 205 (H) 65 - 99 mg/dL   Comment 1 Notify RN   Glucose, capillary     Status: Abnormal   Collection Time: 08/21/17 11:50 AM  Result Value Ref Range   Glucose-Capillary 120 (H) 65 - 99 mg/dL   Comment 1 Notify RN   Glucose, capillary     Status: Abnormal   Collection Time: 08/21/17  4:39 PM  Result Value Ref Range   Glucose-Capillary 398 (H) 65 - 99 mg/dL   Comment 1 Notify RN   Glucose, capillary     Status: Abnormal   Collection Time: 08/21/17  9:07 PM  Result Value Ref Range   Glucose-Capillary 295 (H) 65 - 99 mg/dL  Glucose, capillary     Status: Abnormal   Collection Time: 08/22/17  7:49 AM  Result Value Ref Range   Glucose-Capillary 153 (H) 65 - 99 mg/dL   Comment 1 Notify RN   Glucose, capillary     Status: Abnormal   Collection Time: 08/22/17 11:32 AM  Result Value Ref Range   Glucose-Capillary 131 (H) 65 - 99 mg/dL    Blood Alcohol level:  Lab Results  Component Value Date   ETH <10 08/18/2017   ETH <5 31/51/7616    Metabolic Disorder Labs:   Lab Results  Component Value Date   HGBA1C 9.2 (H) 11/25/2015   No results found for: PROLACTIN Lab Results  Component Value Date   CHOL 150 08/14/2013  TRIG 221 (H) 08/14/2013   HDL 33 (L) 08/14/2013   VLDL 44 (H) 08/14/2013   LDLCALC 73 08/14/2013   LDLCALC 134 (H) 02/22/2012    Current Medications: Current Facility-Administered Medications  Medication Dose Route Frequency Provider Last Rate Last Dose  . acetaminophen (TYLENOL) tablet 650 mg  650 mg Oral Q6H PRN Clapacs, Madie Reno, MD       Or  . acetaminophen (TYLENOL) suppository 650 mg  650 mg Rectal Q6H PRN Clapacs, Madie Reno, MD      . acetaminophen (TYLENOL) tablet 650 mg  650 mg Oral Q6H PRN Clapacs, [name] T, MD      . alum & mag hydroxide-simeth (MAALOX/MYLANTA) 200-200-20 MG/5ML suspension 30 mL  30 mL Oral Q4H PRN Clapacs, [name] T, MD      . alum & mag hydroxide-simeth (MAALOX/MYLANTA) 200-200-20 MG/5ML suspension 30 mL  30 mL Oral Q4H PRN Jaeshawn Silvio, Tyson Babinski, MD      . bisacodyl (DULCOLAX) suppository 10 mg  10 mg Rectal Daily PRN Clapacs, [name] T, MD      . diphenhydrAMINE (BENADRYL) capsule 50 mg  50 mg Oral QHS PRN Clapacs, [name] T, MD      . docusate sodium (COLACE) capsule 100 mg  100 mg Oral BID Clapacs, [name] T, MD   100 mg at 08/22/17 1641  . gabapentin (NEURONTIN) capsule 900 mg  900 mg Oral TID Marylin Crosby, MD   900 mg at 08/22/17 1643  . hydrOXYzine (ATARAX/VISTARIL) tablet 50 mg  50 mg Oral TID PRN Landree Fernholz, Tyson Babinski, MD      . insulin aspart (novoLOG) injection 0-5 Units  0-5 Units Subcutaneous QHS Clapacs, [name] T, MD      . insulin aspart (novoLOG) injection 0-9 Units  0-9 Units Subcutaneous TID WC Clapacs, Madie Reno, MD   9 Units at 08/22/17 1642  . insulin aspart (novoLOG) injection 3 Units  3 Units Subcutaneous TID WC Abria Vannostrand, Tyson Babinski, MD   3 Units at 08/22/17 1643  . magnesium hydroxide (MILK OF MAGNESIA) suspension 30 mL  30 mL Oral Daily PRN Kamille Toomey, Tyson Babinski, MD      . mirtazapine (REMERON) tablet 15 mg  15 mg Oral QHS  Clapacs, [name] T, MD      . nicotine (NICODERM CQ - dosed in mg/24 hours) patch 21 mg  21 mg Transdermal Daily Clapacs, [name] T, MD      . OLANZapine (ZYPREXA) tablet 5 mg  5 mg Oral Q6H PRN Crew Goren, Tyson Babinski, MD   5 mg at 08/22/17 1641  . ondansetron (ZOFRAN) tablet 4 mg  4 mg Oral Q6H PRN Clapacs, [name] T, MD      . traZODone (DESYREL) tablet 100 mg  100 mg Oral QHS Clapacs, Madie Reno, MD       PTA Medications: Prescriptions Prior to Admission  Medication Sig Dispense Refill Last Dose  . insulin glargine (LANTUS) 100 UNIT/ML injection Inject 0.38 mLs (38 Units total) into the skin at bedtime. 10 mL 11   . mirtazapine (REMERON) 15 MG tablet Take 1 tablet (15 mg total) by mouth at bedtime.     . nicotine (NICODERM CQ - DOSED IN MG/24 HOURS) 21 mg/24hr patch Place 1 patch (21 mg total) onto the skin daily. (Patient not taking: Reported on 11/26/2015) 28 patch 0 Not Taking at Unknown time  . traZODone (DESYREL) 100 MG tablet Take 1 tablet (100 mg total) by mouth at bedtime.       Musculoskeletal: Strength &  Muscle Tone: within normal limits Gait & Station: normal Patient leans: N/A  Psychiatric Specialty Exam: Physical Exam  Nursing note and vitals reviewed.   Review of Systems  All other systems reviewed and are negative.   Blood pressure 118/78, pulse 96, temperature 98.2 F (36.8 C), temperature source Oral, resp. rate 18, height 6' 2"  (1.88 m), weight 90.7 kg (200 lb), SpO2 97 %.Body mass index is 25.68 kg/m.  General Appearance: Disheveled  Eye Contact:  Poor  Speech:  Clear and Coherent  Volume:  Decreased  Mood:  Depressed and Irritable  Affect:  Constricted  Thought Process:  Goal Directed  Orientation:  Full (Time, Place, and Person)  Thought Content:  Negative  Suicidal Thoughts:  Yes.  without intent/plan  Homicidal Thoughts:  No  Memory:  Immediate;   Poor  Judgement:  Fair  Insight:  Fair  Psychomotor Activity:  Normal  Concentration:  Concentration: Poor  Recall:  [employer] of Knowledge:  Fair  Language:  Fair  Akathisia:  No      Assets:  Communication Skills Social Support  ADL's:  Intact  Cognition:  WNL  Sleep:       Treatment Plan Summary: 31 yo male with hsitory of depression admitted due to worsening depression and SI. Pt reports chronic history of these symptoms that have been exacerbated by the death of his mother last year and the recent death of his best friend a week ago. He has had several hospitalizations and has been on several medications and states that nothing has ever been helpful. He reports diagnosis of bipolar disorder at age 33 but only reports very vague history of manic symptoms. Pt was started on Remeron while on the medication floor. He is very medication seeking for narcotics and ambivalent about psychiatric medications because he states he has tried everything. We agreed to increase Gabapentin to help with pain and anxiety.   Plan: 1. MDD -Continue Remeron 15 mg qhs  2. Anxiety -Increase gabapentin to 900 mg TID  3. Neuropathic pain -Increase Gabapentin to 900 mg TID  4. Type I diabetes -Diabetes care manager on board and adjusting insulin  5. Insomnia -Trazodone 100 mg qhs  6. Social/Dispo-he lives at boarding house where he pays weekly rent. He states that he does not have family or friends that are supportive.   Observation Level/Precautions:  15 minute checks  Laboratory:  Done on medical floor  Psychotherapy:    Medications:    Consultations:    Discharge Concerns:    Estimated LOS:5-7 days  Other:     Physician Treatment Plan for Primary Diagnosis: Severe recurrent major depression without psychotic features ([place]) Long Term Goal(s): Improvement in symptoms so as ready for discharge  Short Term Goals: Ability to verbalize feelings will improve and Ability to disclose and discuss suicidal ideas  Physician Treatment Plan for Secondary Diagnosis: Active Problems:   Severe recurrent major depression  without psychotic features ([place])   MDD (major depressive disorder)  Long Term Goal(s): Improvement in symptoms so as ready for discharge  Short Term Goals: Ability to identify changes in lifestyle to reduce recurrence of condition will improve, Ability to verbalize feelings will improve, Ability to maintain clinical measurements within normal limits will improve and Compliance with prescribed medications will improve  I certify that inpatient services furnished can reasonably be expected to improve the patient's condition.    Marylin Crosby, MD 11/1/20184:45 PM

## 2017-08-22 NOTE — Tx Team (Signed)
Initial Treatment Plan 08/22/2017 4:55 PM Mark JackMichael Q Rowles AVW:[id number]    PATIENT STRESSORS: Health problems Occupational concerns Traumatic event   PATIENT STRENGTHS: Capable of independent living Communication skills Work skills   PATIENT IDENTIFIED PROBLEMS: Suicidal ideations 08/22/2017  Major depression 08/22/2017                   DISCHARGE CRITERIA:  Ability to meet basic life and health needs Adequate post-discharge living arrangements Medical problems require only outpatient monitoring  PRELIMINARY DISCHARGE PLAN: Attend aftercare/continuing care group Return to previous living arrangement  PATIENT/FAMILY INVOLVEMENT: This treatment plan has been presented to and reviewed with the patient, Mark [name], and/or family member,   The patient and family have been given the opportunity to ask questions and make suggestions.  Leonarda SalonGigi George Christel Bai, RN 08/22/2017, 4:55 PM

## 2017-08-22 NOTE — Progress Notes (Signed)
Inpatient Diabetes Program Recommendations  AACE/ADA: New Consensus Statement on Inpatient Glycemic Control (2015)  Target Ranges:  Prepandial:   less than 140 mg/dL      Peak postprandial:   less than 180 mg/dL (1-2 hours)      Critically ill patients:  140 - 180 mg/dL   Lab Results  Component Value Date   GLUCAP 295 (H) 08/21/2017   HGBA1C 9.2 (H) 11/25/2015    Review of Glycemic Control  Results for Mark [name], Rohin Q (MRN [id number]) as of 08/22/2017 07:38  Ref. Range 08/20/2017 21:17 08/21/2017 07:57 08/21/2017 11:50 08/21/2017 16:39 08/21/2017 21:07  Glucose-Capillary Latest Ref Range: 65 - 99 mg/dL 562182 (H) 130205 (H) 865120 (H) 398 (H) 295 (H)    Diabetes history:DM2 Outpatient Diabetes medications: Lantus 50 units QHS, Novolog 35 units QID  Current orders for Inpatient glycemic control: Lantus 38 units daily, Novolog 0-9 units TID with meals, Novolog 0-5 units QHS,   Inpatient Diabetes Program Recommendations: Please order Novolog 3 units tid and continue Novolog correction as ordered.  (did not receive correction Novolog at 11:50am for lunch- pre-supper CBG 398mg /dl indicative that he needs Novolog at each meal.    Per ADA recommendations "consider performing an A1C on all patients with diabetes or hyperglycemia admitted to the hospital if not performed in the prior 3 months".  Susette RacerJulie Willey Due, RN, BA, MHA, CDE Diabetes Coordinator Inpatient Glycemic Control Team [phone] (Team Pager) [phone] [facility] Office) 08/22/2017 7:39 AM

## 2017-08-22 NOTE — BH Assessment (Signed)
[facility] by Dr. Toni Amendlapacs.  Attending Physician will be Dr. Johnella MoloneyMcNew.   Patient [facility] to room 303, [facility] [place].   Intake Paper Work has been signed and placed on patient chart.  Pt RN Almira Coaster(Gina) has been informed of pt admission. Pt access [facility](Carla) informed for pre-admit.

## 2017-08-22 NOTE — BHH Suicide Risk Assessment (Signed)
[facility]  Current Mental Status:   Depressed, suicidal Loss Factors:   Loss of best friend last week Historical Factors:   long history of depression and multiple suicide attempts Risk Reduction Factors:   medications  Total Time spent with patient: 45 minutes Principal Problem: Severe recurrent major depression without psychotic features ([facility]) Diagnosis:   Patient Active Problem List   Diagnosis Date Noted  . MDD (major depressive disorder) [F32.9] 08/22/2017  . Severe recurrent major depression without psychotic features ([facility]) [F33.2] 08/19/2017  . Nausea & vomiting [R11.2] 08/18/2017  . Abdominal pain [R10.9] 08/18/2017  . Hyponatremia [E87.1] 08/18/2017  . DKA (diabetic ketoacidoses) ([facility]) [E13.10] 11/24/2015  . Diabetic keto-acidosis ([facility]) [E13.10] 10/23/2015   Subjective Data: See H&P  Continued Clinical Symptoms:  Alcohol Use Disorder Identification Test Final Score (AUDIT): 1 The "Alcohol Use Disorders Identification Test", Guidelines for Use in Primary Care, Second Edition.  World Science writerHealth Organization [facility](WHO). Score between 0-7:  no or low risk or alcohol related problems. Score between 8-15:  moderate risk of alcohol related problems. Score between 16-19:  high risk of alcohol related problems. Score 20 or above:  warrants further diagnostic evaluation for alcohol dependence and treatment.   CLINICAL FACTORS:   Depression:   Anhedonia Hopelessness Insomnia   Musculoskeletal: Strength & Muscle Tone: within normal limits Gait & Station: normal Patient leans: n/a  Psychiatric Specialty Exam: Physical Exam  Nursing note and vitals reviewed.   ROS  Blood pressure 118/78, pulse 96, temperature 98.2 F (36.8 C), temperature source Oral, resp. rate 18, height 6\' 2"  (1.88 m), weight 90.7 kg (200 lb), SpO2 97 %.Body mass index is 25.68 kg/m.  See H&P      COGNITIVE FEATURES THAT CONTRIBUTE TO RISK:  Closed-mindedness    SUICIDE RISK:    Severe:  Frequent, intense, and enduring suicidal ideation, specific plan, no subjective intent, but some objective markers of intent (i.e., choice of lethal method), the method is accessible, some limited preparatory behavior, evidence of impaired self-control, severe dysphoria/symptomatology, multiple risk factors present, and few if any protective factors, particularly a lack of social support.  PLAN OF CARE:  -Medication adjustments  I certify that inpatient services furnished can reasonably be expected to improve the patient's condition.   Haskell RilingHolly R Aryan Sparks, MD 08/22/2017, 5:40 PM

## 2017-08-23 LAB — CBC WITH DIFFERENTIAL/PLATELET
BASOS ABS: 0.1 10*3/uL (ref 0–0.1)
BASOS PCT: 1 %
EOS ABS: 0.3 10*3/uL (ref 0–0.7)
EOS PCT: 4 %
HCT: 44.9 % (ref 40.0–52.0)
HEMOGLOBIN: 15.3 g/dL (ref 13.0–18.0)
LYMPHS PCT: 26 %
Lymphs Abs: 2.1 10*3/uL (ref 1.0–3.6)
MCH: 33.3 pg (ref 26.0–34.0)
MCHC: 34 g/dL (ref 32.0–36.0)
MCV: 97.8 fL (ref 80.0–100.0)
MONO ABS: 0.5 10*3/uL (ref 0.2–1.0)
Monocytes Relative: 6 %
Neutro Abs: 5.1 10*3/uL (ref 1.4–6.5)
Neutrophils Relative %: 63 %
PLATELETS: 283 10*3/uL (ref 150–440)
RBC: 4.59 MIL/uL (ref 4.40–5.90)
RDW: 15.1 % — ABNORMAL HIGH (ref 11.5–14.5)
WBC: 8 10*3/uL (ref 3.8–10.6)

## 2017-08-23 LAB — T4, FREE: FREE T4: 0.77 ng/dL (ref 0.61–1.12)

## 2017-08-23 LAB — LIPID PANEL
CHOLESTEROL: 340 mg/dL — AB (ref 0–200)
HDL: 81 mg/dL (ref >40)
LDL Cholesterol: 216 mg/dL — ABNORMAL HIGH (ref 0–99)
Total CHOL/HDL Ratio: 4.2 RATIO
Triglycerides: 217 mg/dL — ABNORMAL HIGH (ref <150)
VLDL: 43 mg/dL — ABNORMAL HIGH (ref 0–40)

## 2017-08-23 LAB — GLUCOSE, CAPILLARY
GLUCOSE-CAPILLARY: 521 mg/dL — AB (ref 65–99)
GLUCOSE-CAPILLARY: 232 mg/dL — AB (ref 65–99)
GLUCOSE-CAPILLARY: 183 mg/dL — AB (ref 65–99)
GLUCOSE-CAPILLARY: 270 mg/dL — AB (ref 65–99)
Glucose-Capillary: 64 mg/dL — ABNORMAL LOW (ref 65–99)

## 2017-08-23 LAB — BASIC METABOLIC PANEL
ANION GAP: 12 (ref 5–15)
BUN: 26 mg/dL — AB (ref 6–20)
CALCIUM: 9.8 mg/dL (ref 8.9–10.3)
CO2: 24 mmol/L (ref 22–32)
Chloride: 95 mmol/L — ABNORMAL LOW (ref 101–111)
Creatinine, Ser: 0.82 mg/dL (ref 0.61–1.24)
GFR calc Af Amer: >60 mL/min (ref >60)
GLUCOSE: 474 mg/dL — AB (ref 65–99)
POTASSIUM: 4.8 mmol/L (ref 3.5–5.1)
SODIUM: 131 mmol/L — AB (ref 135–145)

## 2017-08-23 LAB — BLOOD GAS, ARTERIAL
ACID-BASE DEFICIT: 4.6 mmol/L — AB (ref 0.0–2.0)
BICARBONATE: 20.4 mmol/L (ref 20.0–28.0)
FIO2: 0.21
O2 Saturation: 97 %
PH ART: 7.35 (ref 7.350–7.450)
Patient temperature: 37
pCO2 arterial: 37 mmHg (ref 32.0–48.0)
pO2, Arterial: 95 mmHg (ref 83.0–108.0)

## 2017-08-23 LAB — TSH: TSH: 20.961 u[IU]/mL — AB (ref 0.350–4.500)

## 2017-08-23 MED ORDER — INSULIN GLARGINE 100 UNIT/ML ~~LOC~~ SOLN
38.0000 [IU] | Freq: Every day | SUBCUTANEOUS | Status: DC
  Administered 2017-08-23: 38 [IU] via SUBCUTANEOUS
  Filled 2017-08-23 (×2): qty 0.38

## 2017-08-23 MED ORDER — INSULIN GLARGINE 100 UNIT/ML ~~LOC~~ SOLN
38.0000 [IU] | Freq: Every day | SUBCUTANEOUS | Status: DC
  Filled 2017-08-23: qty 0.38

## 2017-08-23 MED ORDER — VENLAFAXINE HCL ER 75 MG PO CP24
75.0000 mg | ORAL_CAPSULE | Freq: Every day | ORAL | Status: DC
  Administered 2017-08-23 – 2017-08-24 (×2): 75 mg via ORAL
  Filled 2017-08-23 (×2): qty 1

## 2017-08-23 MED ORDER — INSULIN GLARGINE 100 UNIT/ML ~~LOC~~ SOLN: 38.0000 [IU] | Freq: Every day | SUBCUTANEOUS | Status: DC

## 2017-08-23 MED ORDER — INSULIN ASPART 100 UNIT/ML IV SOLN
20.0000 [IU] | Freq: Once | INTRAVENOUS | Status: AC
  Administered 2017-08-23: 20 [IU] via INTRAVENOUS
  Filled 2017-08-23: qty 0.2

## 2017-08-23 NOTE — Progress Notes (Signed)
Recreation Therapy Notes  Date: 11.02.18  Time: 3:00pm  Location: Craft room  Behavioral response: Did not attend  Group Type: Art/Craft  Participation level: Did not attend  Communication: Patient did not attend group.  Comments: N/A  Preet Mangano LRT/CTRS        Saria Haran 08/23/2017 4:14 PM

## 2017-08-23 NOTE — Progress Notes (Signed)
Inpatient Diabetes Program Recommendations  AACE/ADA: New Consensus Statement on Inpatient Glycemic Control (2015)  Target Ranges:  Prepandial:   less than 140 mg/dL      Peak postprandial:   less than 180 mg/dL (1-2 hours)      Critically ill patients:  140 - 180 mg/dL   Results for Mark [name], Mark [name] (MRN [id number]) as of 08/23/2017 09:18  Ref. Range 08/21/2017 07:57 08/21/2017 11:50 08/21/2017 16:39 08/21/2017 21:07  Glucose-Capillary Latest Ref Range: 65 - 99 mg/dL 409205 (H)  3 units Novolog 120 (H)  0 units Novolog 398 (H)  9 units Novolog 295 (H)  3 units Novolog +   38 units Lantus   Results for Mark [name], Mark [name] (MRN [id number]) as of 08/23/2017 09:18  Ref. Range 08/22/2017 07:49 08/22/2017 11:32 08/22/2017 16:36 08/22/2017 21:12  Glucose-Capillary Latest Ref Range: 65 - 99 mg/dL 956153 (H)  2 units Novolog 131 (H)  1 unit Novolog  409 (H)  12 units Novolog 182 (H)  NO Lantus given    Results for Mark [name], Mark [name] (MRN [id number]) as of 08/23/2017 09:18  Ref. Range 08/23/2017 07:19  Glucose-Capillary Latest Ref Range: 65 - 99 mg/dL 469521 (HH)  12 units Novolog    Home DM Meds: Lantus 50 units QHS         Novolog 35 units QID  Current Insulin Orders: Lantus 38 units daily      Novolog Sensitive Correction Scale/ SSI (0-9 units) TID AC + HS        Novolog 3 units TID with meals      MD- Note patient did NOT receive any Lantus yesterday.  Not sure why??  This is likely the reason pt's CBG was extremely elevated this AM: 521 mg/dl.  BMET from 6:30am shows CO2 OK at 24 and Anion Gap OK at 12.  Note Lantus to start this AM and patient given Novolog SSI and Novolog Meal Coverage this AM.      --Will follow patient during hospitalization--  Ambrose FinlandJeannine Johnston Shantelle Alles RN, MSN, CDE Diabetes Coordinator Inpatient Glycemic Control Team Team Pager: [phone] (8a-5p)

## 2017-08-23 NOTE — Plan of Care (Signed)
Problem: Activity: Goal: Sleeping patterns will improve Outcome: Progressing Patient is monitored while sleeping every 15 minutes with out disturbance   Problem: Coping: Goal: Ability to demonstrate self-control will improve Outcome: Progressing Blood sugar has improved and controled with sliding scale and patient is responding well to treatment

## 2017-08-23 NOTE — Progress Notes (Signed)
Patient's BS 522 this a.m. On-call MD notified of such. Per Dr. Daleen Boavi, give the 3 units scheduled plus the 9 units of sliding scale coverage and contact the hospitalist. Dr. Johnella MoloneyMcNew also notified of BS reading. The hospital on call MD came down to assess patient. Lantus 38 units QAM ordered and administered. Patient up ad lib with steady gait. Complaint with meals and medication. Will continue to monitor and notify MD if any acute changes occur.

## 2017-08-23 NOTE — Plan of Care (Signed)
Problem: Role Relationship: Goal: Ability to demonstrate positive changes in social behaviors and relationships will improve Outcome: Not Progressing Patient does not interact well with staff nor peers  Problem: Self-Concept: Goal: Ability to verbalize positive feelings about self will improve Outcome: Not Progressing Patient does not verbalize positive feelings about himself. Goal: Level of anxiety will decrease Outcome: Progressing Patient's anxiety level is improving.
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
## 2017-08-23 NOTE — Plan of Care (Signed)
Problem: Activity: Goal: Sleeping patterns will improve Outcome: Progressing Patient slept for Estimated Hours of 7.15; Precautionary checks every 15 minutes for safety maintained, room free of safety hazards, patient sustains no injury or falls during this shift.    

## 2017-08-23 NOTE — Tx Team (Addendum)
Interdisciplinary Treatment and Diagnostic Plan Update  08/23/2017 Time of Session: 1100 Mark [name] MRN: [id number]  Principal Diagnosis: Severe recurrent major depression without psychotic features [facility])  Secondary Diagnoses: Principal Problem:   Severe recurrent major depression without psychotic features ([place]) Active Problems:   MDD (major depressive disorder)   Current Medications:  Current Facility-Administered Medications  Medication Dose Route Frequency Provider Last Rate Last Dose  . acetaminophen (TYLENOL) tablet 650 mg  650 mg Oral Q6H PRN Clapacs, Madie Reno, MD       Or  . acetaminophen (TYLENOL) suppository 650 mg  650 mg Rectal Q6H PRN Clapacs, Madie Reno, MD      . acetaminophen (TYLENOL) tablet 650 mg  650 mg Oral Q6H PRN Clapacs, [name] T, MD      . alum & mag hydroxide-simeth (MAALOX/MYLANTA) 200-200-20 MG/5ML suspension 30 mL  30 mL Oral Q4H PRN Clapacs, [name] T, MD      . alum & mag hydroxide-simeth (MAALOX/MYLANTA) 200-200-20 MG/5ML suspension 30 mL  30 mL Oral Q4H PRN McNew, Tyson Babinski, MD      . bisacodyl (DULCOLAX) suppository 10 mg  10 mg Rectal Daily PRN Clapacs, [name] T, MD      . diphenhydrAMINE (BENADRYL) capsule 50 mg  50 mg Oral QHS PRN Clapacs, [name] T, MD      . docusate sodium (COLACE) capsule 100 mg  100 mg Oral BID Clapacs, Madie Reno, MD   100 mg at 08/23/17 0741  . gabapentin (NEURONTIN) capsule 900 mg  900 mg Oral TID Marylin Crosby, MD   900 mg at 08/23/17 0740  . hydrOXYzine (ATARAX/VISTARIL) tablet 50 mg  50 mg Oral TID PRN McNew, Tyson Babinski, MD      . insulin aspart (novoLOG) injection 0-5 Units  0-5 Units Subcutaneous QHS Clapacs, [name] T, MD      . insulin aspart (novoLOG) injection 0-9 Units  0-9 Units Subcutaneous TID WC Clapacs, Madie Reno, MD   9 Units at 08/23/17 0740  . insulin aspart (novoLOG) injection 3 Units  3 Units Subcutaneous TID WC McNew, Tyson Babinski, MD   3 Units at 08/23/17 0740  . [START ON 08/24/2017] insulin glargine (LANTUS) injection 38 Units  38  Units Subcutaneous QHS McNew, Holly R, MD      . magnesium hydroxide (MILK OF MAGNESIA) suspension 30 mL  30 mL Oral Daily PRN McNew, Holly R, MD      . mirtazapine (REMERON) tablet 15 mg  15 mg Oral QHS Clapacs, Madie Reno, MD   15 mg at 08/22/17 2109  . nicotine (NICODERM CQ - dosed in mg/24 hours) patch 21 mg  21 mg Transdermal Daily Clapacs, Madie Reno, MD   21 mg at 08/23/17 0741  . OLANZapine (ZYPREXA) tablet 5 mg  5 mg Oral Q6H PRN Marylin Crosby, MD   5 mg at 08/22/17 1641  . ondansetron (ZOFRAN) tablet 4 mg  4 mg Oral Q6H PRN Clapacs, [name] T, MD      . traZODone (DESYREL) tablet 100 mg  100 mg Oral QHS Clapacs, Madie Reno, MD   100 mg at 08/22/17 2109  . venlafaxine XR (EFFEXOR-XR) 24 hr capsule 75 mg  75 mg Oral Q breakfast McNew, Tyson Babinski, MD       PTA Medications: Prescriptions Prior to Admission  Medication Sig Dispense Refill Last Dose  . insulin glargine (LANTUS) 100 UNIT/ML injection Inject 0.38 mLs (38 Units total) into the skin at bedtime. 10 mL 11   .  mirtazapine (REMERON) 15 MG tablet Take 1 tablet (15 mg total) by mouth at bedtime.     . nicotine (NICODERM CQ - DOSED IN MG/24 HOURS) 21 mg/24hr patch Place 1 patch (21 mg total) onto the skin daily. (Patient not taking: Reported on 11/26/2015) 28 patch 0 Not Taking at Unknown time  . traZODone (DESYREL) 100 MG tablet Take 1 tablet (100 mg total) by mouth at bedtime.       Patient Stressors: Health problems Occupational concerns Traumatic event  Patient Strengths: Capable of independent living Communication skills Work skills  Treatment Modalities: Medication Management, Group therapy, Case management,  1 to 1 session with clinician, Psychoeducation, Recreational therapy.   Physician Treatment Plan for Primary Diagnosis: Severe recurrent major depression without psychotic features ([place]) Long Term Goal(s): Improvement in symptoms so as ready for discharge Improvement in symptoms so as ready for discharge   Short Term Goals:  Ability to verbalize feelings will improve Ability to disclose and discuss suicidal ideas Ability to identify changes in lifestyle to reduce recurrence of condition will improve Ability to verbalize feelings will improve Ability to maintain clinical measurements within normal limits will improve Compliance with prescribed medications will improve  Medication Management: Evaluate patient's response, side effects, and tolerance of medication regimen.  Therapeutic Interventions: 1 to 1 sessions, Unit Group sessions and Medication administration.  Evaluation of Outcomes: Not Met  Physician Treatment Plan for Secondary Diagnosis: Principal Problem:   Severe recurrent major depression without psychotic features ([place]) Active Problems:   MDD (major depressive disorder)  Long Term Goal(s): Improvement in symptoms so as ready for discharge Improvement in symptoms so as ready for discharge   Short Term Goals: Ability to verbalize feelings will improve Ability to disclose and discuss suicidal ideas Ability to identify changes in lifestyle to reduce recurrence of condition will improve Ability to verbalize feelings will improve Ability to maintain clinical measurements within normal limits will improve Compliance with prescribed medications will improve     Medication Management: Evaluate patient's response, side effects, and tolerance of medication regimen.  Therapeutic Interventions: 1 to 1 sessions, Unit Group sessions and Medication administration.  Evaluation of Outcomes: Not Met   RN Treatment Plan for Primary Diagnosis: Severe recurrent major depression without psychotic features ([place]) Long Term Goal(s): Knowledge of disease and therapeutic regimen to maintain health will improve  Short Term Goals: Ability to verbalize feelings will improve, Ability to identify and develop effective coping behaviors will improve and Compliance with prescribed medications will improve  Medication  Management: RN will administer medications as ordered by provider, will assess and evaluate patient's response and provide education to patient for prescribed medication. RN will report any adverse and/or side effects to prescribing provider.  Therapeutic Interventions: 1 on 1 counseling sessions, Psychoeducation, Medication administration, Evaluate responses to treatment, Monitor vital signs and CBGs as ordered, Perform/monitor CIWA, COWS, AIMS and Fall Risk screenings as ordered, Perform wound care treatments as ordered.  Evaluation of Outcomes: Not Met   LCSW Treatment Plan for Primary Diagnosis: Severe recurrent major depression without psychotic features ([place]) Long Term Goal(s): Safe transition to appropriate next level of care at discharge, Engage patient in therapeutic group addressing interpersonal concerns.  Short Term Goals: Engage patient in aftercare planning with referrals and resources, Increase social support and Increase skills for wellness and recovery  Therapeutic Interventions: Assess for all discharge needs, 1 to 1 time with Social worker, Explore available resources and support systems, Assess for adequacy in community support network, Educate family  and significant other(s) on suicide prevention, Complete Psychosocial Assessment, Interpersonal group therapy.  Evaluation of Outcomes: Not Met   Progress in Treatment: Attending groups: No. Participating in groups: No. Taking medication as prescribed: Yes. Toleration medication: Yes. Family/Significant other contact made: No, will contact:  when given permission Patient understands diagnosis: Yes. Discussing patient identified problems/goals with staff: Yes. Medical problems stabilized or resolved: Yes. Denies suicidal/homicidal ideation: Yes. Issues/concerns per patient self-inventory: No. Other: none  New problem(s) identified: No, Describe:  none  New Short Term/Long Term Goal(s):Pt goal: I need my medication  changed.  Discharge Plan or Barriers: CSW assessing for appropriate plan.  Reason for Continuation of Hospitalization: Depression Medication stabilization  Estimated Length of Stay: 5-7 days.  Recreational Therapy: Patient Stressors: N/A Patient Goal: STG-The Patient will attend and participate in Recreation Therapy Group Sessions x5 days.   Attendees: Patient: Mark [name] 08/23/2017   Physician: Dr Wonda Olds, MD 08/23/2017   Nursing: Elige Radon, RN 08/23/2017   RN Care Manager: 08/23/2017   Social Worker: Lurline Idol, LCSW 08/23/2017   Recreational Therapist: Roanna Epley, LRT/CTRS 08/23/2017   Other:  08/23/2017   Other:  08/23/2017   Other: 08/23/2017        Scribe for Treatment Team: Joanne Chars, [place] 08/23/2017 11:37 AM

## 2017-08-23 NOTE — Progress Notes (Signed)
Recreation Therapy Notes  Date: 11.02.18  Time: 9:30 am  Location: Craft Room  Behavioral response: N/A  Intervention Topic: Stress  Discussion/Intervention: Patient did not attend group.  Clinical Observations/Feedback:  Patient did not attend group.  Aneesa Romey LRT/CTRS       Jjesus Dingley 08/23/2017 11:48 AM

## 2017-08-23 NOTE — Progress Notes (Signed)
Recreation Therapy Notes  INPATIENT RECREATION THERAPY ASSESSMENT  Patient Details Name: Mark [name] MRN: [id number] DOB: 07/31/1986 Today's Date: 08/23/2017  Patient Stressors:  (refused) Patient refused assessment. Coping Skills:    (refused)  Personal Challenges:  (refused)  Leisure Interests (2+):   (refused)  Awareness of Community Resources:    Patient refused  Community Resources:    Patient refused  Current Use:   Patient refused  If no, Barriers?:   Patient refused  Patient Strengths:  Patient refused  Patient Identified Areas of Improvement:  Patient refused  Current Recreation Participation:  Patient refused  Patient Goal for Hospitalization:  Patient refused  [place]  [place]  [place]  Patient refused   Current SI (including self-harm):   (Patient refused)  Current HI:   (Patient refused)  Consent to Intern Participation:  (Patient refused)   Juliana Boling 08/23/2017, 2:07 PM

## 2017-08-23 NOTE — Consult Note (Signed)
Medical Consultation  Mark [name]:[id number] DOB: 10-Sep-1986 DOA: 08/22/2017 PCP: Patient, No Pcp Per   Requesting physician: dr Toni Amend Date of consultation: 08/23/2017 Reason for consultation: elevated BS  Impression/Recommendations  31 y/o male with DM, tobaccoe dependence and depression admitted to Psych unti for depression.  1. Diabetes/hyperglycemia: HE was NOT GIVEN LANTUS as instructed on discharge summary. Start lantus now and continue SSI BMP does not show evidence of DKA Diabetes Nurse consult  2. Tobacco dependence: Patient is encouraged to quit smoking. Counseling was provided for 4 minutes.  3. Depression : As per Psych    Chief Complaint: elevated BS  HPI:  31 year old male with history diabetes who is transferred to behavioral health for treatment for depression. Hospital service was consulted due to elevated blood sugar. It is noted the patient did not receive Lantus last night.  Review of Systems  Constitutional: Negative for fever, chills weight loss HENT: Negative for ear pain, nosebleeds, congestion, facial swelling, rhinorrhea, neck pain, neck stiffness and ear discharge.   Respiratory: Negative for cough, shortness of breath, wheezing  Cardiovascular: Negative for chest pain, palpitations and leg swelling.  Gastrointestinal: Negative for heartburn, abdominal pain, vomiting, diarrhea or consitpation Genitourinary: Negative for dysuria, urgency, frequency, hematuria Musculoskeletal: Negative for back pain or joint pain Neurological: Negative for dizziness, seizures, syncope, focal weakness,  numbness and headaches.  Hematological: Does not bruise/bleed easily.  Psychiatric/Behavioral: Positive for depression   Past Medical History:  Diagnosis Date  . Diabetes mellitus without complication (HCC)   . Hydrocephalus    Past Surgical History:  Procedure Laterality Date  . KIDNEY STONE SURGERY     Social History:  reports that he has been smoking  Cigarettes.  He has been smoking about 1.00 pack per day. He has never used smokeless tobacco. He reports that he does not drink alcohol or use drugs.  Allergies  Allergen Reactions  . Penicillins Anaphylaxis, Hives and Swelling    Has patient had a PCN reaction causing immediate rash, facial/tongue/throat swelling, SOB or lightheadedness with hypotension: Yes Has patient had a PCN reaction causing severe rash involving mucus membranes or skin necrosis: No Has patient had a PCN reaction that required hospitalization Yes Has patient had a PCN reaction occurring within the last 10 years: Yes If all of the above answers are "NO", then may proceed with Cephalosporin use.  . Sulfa Antibiotics Anaphylaxis and Hives  . Clindamycin/Lincomycin Hives   Family History  Problem Relation Age of Onset  . Breast cancer Mother     Prior to Admission medications   Medication Sig Start Date End Date Taking? Authorizing Provider  insulin glargine (LANTUS) 100 UNIT/ML injection Inject 0.38 mLs (38 Units total) into the skin at bedtime. 08/21/17   Adrian Saran, MD  mirtazapine (REMERON) 15 MG tablet Take 1 tablet (15 mg total) by mouth at bedtime. 08/22/17   Adrian Saran, MD  nicotine (NICODERM CQ - DOSED IN MG/24 HOURS) 21 mg/24hr patch Place 1 patch (21 mg total) onto the skin daily. Patient not taking: Reported on 11/26/2015 10/24/15   Adrian Saran, MD  traZODone (DESYREL) 100 MG tablet Take 1 tablet (100 mg total) by mouth at bedtime. 08/21/17   Adrian Saran, MD    Physical Exam: Blood pressure 107/83, pulse 79, temperature 98.4 F (36.9 C), temperature source Oral, resp. rate 18, height 6\' 2"  (1.88 m), weight 90.7 kg (200 lb), SpO2 99 %. @VITALS2 @ Filed Weights   08/22/17 1543  Weight: 90.7 kg (200 lb)  Intake/Output Summary (Last 24 hours) at 08/23/17 0816 Last data filed at 08/22/17 1738  Gross per 24 hour  Intake              240 ml  Output                0 ml  Net              240 ml      Constitutional: Appears well-developed and well-nourished. No distress. HENT: Normocephalic. Marland Kitchen. Oropharynx is clear and moist.  Eyes: Conjunctivae and EOM are normal. PERRLA, no scleral icterus.  Neck: Normal ROM. Neck supple. No JVD. No tracheal deviation. CVS: RRR, S1/S2 +, no murmurs, no gallops, no carotid bruit.  Pulmonary: Effort and breath sounds normal, no stridor, rhonchi, wheezes, rales.  Abdominal: Soft. BS +,  no distension, tenderness, rebound or guarding.  Musculoskeletal: Normal range of motion. No edema and no tenderness.  Neuro: Alert. CN 2-12 grossly intact. No focal deficits. Skin: Skin is warm and dry. No rash noted. Psychiatric: Normal mood and affect.    Labs  Basic Metabolic Panel:  Recent Labs Lab 08/20/17 0818 08/21/17 0450  NA 127* 134*  K 4.1 4.4  CL 94* 100*  CO2 21* 26  GLUCOSE 393* 305*  BUN 21* 19  CREATININE 0.85 0.75  CALCIUM 8.7* 9.0  MG 1.6* 1.7  PHOS 2.8  --    Liver Function Tests:  Recent Labs Lab 08/19/17 0324  AST 68*  ALT 78*  ALKPHOS 123  BILITOT 0.9  PROT 5.8*  ALBUMIN 3.1*   No results for input(s): LIPASE, AMYLASE in the last 168 hours.  CBC:  Recent Labs Lab 08/18/17 1454  08/20/17 0818  WBC 21.7*  < > 8.1  NEUTROABS 18.4*  --   --   HGB 14.3  < > 12.6*  HCT 45.4  < > 37.2*  MCV 104.7*  < > 97.1  PLT 427  < > 238  < > = values in this interval not displayed. Cardiac Enzymes: No results for input(s): CKTOTAL, CKMB, CKMBINDEX, TROPONINI in the last 168 hours. BNP: Invalid input(s): POCBNP CBG:  Recent Labs Lab 08/23/17 0719  GLUCAP 521*    Radiological Exams: No results found.  EKG: none   Thank you for allowing me to participate in the care of your patient. We will continue to follow.   Note: This dictation was prepared with Dragon dictation along with smaller phrase technology. Any transcriptional errors that result from this process are unintentional.  Time spent: 40  minutes  Jovanka Westgate, MD

## 2017-08-23 NOTE — Progress Notes (Signed)
CBG=521, Oncoming Nurse aware; patient said, "I don't care... That's your problem..." Efforts to dissuade patient from using many packets of sugar in his coffee unsuccessful; this patient was admitted for DKA, considered stable prior to his admission to BMU.

## 2017-08-23 NOTE — Progress Notes (Signed)
[facility] MD Progress Note  08/23/2017 2:00 PM OBE Mark [name]  MRN:  [id number] Subjective:  Pt states that he continues to be depressed and hopeless. He states that he "wants things to go my way for once." We processed through what kinds of things would he like to see in his life and he states, "I want to get on disability that would help a lot." He states that his sister has been helping him with that. He states that he does not think any medications will be helpful because he has tried many of them and they have never worked. We discussed that therapy will be a big piece of it and he was understanding of this. He is less irritable towards this Probation officer today but still demanding towards the nursing staff at times. He states that he did not sleep well last night because he had to urinate so much. He was seen napping through the morning. He continues to endorse SI with no plan at this time. He states that his sister Suanne Marker is supportive. I did attempt to contact her and left her a message to call back. Pt lives at a boarding house currently.  BS was extremely elevated this morning. IM came to evaluate and pt did not receive Lantus injection yesterday as it was ordered for day time and did not get on the unit until later. Pt was also using multiple sugar packets for his coffee and would not respond to encouragement to use less sugar.   Principal Problem: Severe recurrent major depression without psychotic features ([place]) Diagnosis:   Patient Active Problem List   Diagnosis Date Noted  . MDD (major depressive disorder) [F32.9] 08/22/2017  . Severe recurrent major depression without psychotic features ([place]) [F33.2] 08/19/2017  . Nausea & vomiting [R11.2] 08/18/2017  . Abdominal pain [R10.9] 08/18/2017  . Hyponatremia [E87.1] 08/18/2017  . DKA (diabetic ketoacidoses) ([place]) [E13.10] 11/24/2015  . Diabetic keto-acidosis ([place]) [E13.10] 10/23/2015   Total Time spent with patient: 20 minutes  Past Psychiatric  History: See H&P  Past Medical History:  Past Medical History:  Diagnosis Date  . Diabetes mellitus without complication ([place])   . Hydrocephalus     Past Surgical History:  Procedure Laterality Date  . KIDNEY STONE SURGERY     Family History:  Family History  Problem Relation Age of Onset  . Breast cancer Mother    Family Psychiatric  History: See H&P Social History:  History  Alcohol Use No    Comment: ocasional      History  Drug Use No    Social History   Social History  . Marital status: Single    Spouse name: N/A  . Number of children: N/A  . Years of education: N/A   Social History Main Topics  . Smoking status: Current Every Day Smoker    Packs/day: 1.00    Types: Cigarettes  . Smokeless tobacco: Never Used  . Alcohol use No     Comment: ocasional   . Drug use: No  . Sexual activity: Not Asked   Other Topics Concern  . None   Social History Narrative  . None   Sleep: Poor  Appetite:  Fair  Current Medications: Current Facility-Administered Medications  Medication Dose Route Frequency Provider Last Rate Last Dose  . acetaminophen (TYLENOL) tablet 650 mg  650 mg Oral Q6H PRN Clapacs, [name] T, MD       Or  . acetaminophen (TYLENOL) suppository 650 mg  650 mg Rectal Q6H  PRN Clapacs, Madie Reno, MD      . acetaminophen (TYLENOL) tablet 650 mg  650 mg Oral Q6H PRN Clapacs, [name] T, MD      . alum & mag hydroxide-simeth (MAALOX/MYLANTA) 200-200-20 MG/5ML suspension 30 mL  30 mL Oral Q4H PRN Clapacs, [name] T, MD      . alum & mag hydroxide-simeth (MAALOX/MYLANTA) 200-200-20 MG/5ML suspension 30 mL  30 mL Oral Q4H PRN Sonali Wivell, Tyson Babinski, MD      . bisacodyl (DULCOLAX) suppository 10 mg  10 mg Rectal Daily PRN Clapacs, [name] T, MD      . diphenhydrAMINE (BENADRYL) capsule 50 mg  50 mg Oral QHS PRN Clapacs, [name] T, MD      . docusate sodium (COLACE) capsule 100 mg  100 mg Oral BID Clapacs, Madie Reno, MD   100 mg at 08/23/17 0741  . gabapentin (NEURONTIN) capsule 900 mg   900 mg Oral TID Marylin Crosby, MD   900 mg at 08/23/17 1155  . hydrOXYzine (ATARAX/VISTARIL) tablet 50 mg  50 mg Oral TID PRN Mackynzie Woolford, Tyson Babinski, MD      . insulin aspart (novoLOG) injection 0-5 Units  0-5 Units Subcutaneous QHS Clapacs, [name] T, MD      . insulin aspart (novoLOG) injection 0-9 Units  0-9 Units Subcutaneous TID WC Clapacs, Madie Reno, MD   3 Units at 08/23/17 1154  . insulin aspart (novoLOG) injection 3 Units  3 Units Subcutaneous TID WC Moe Brier, Tyson Babinski, MD   3 Units at 08/23/17 1158  . [START ON 08/24/2017] insulin glargine (LANTUS) injection 38 Units  38 Units Subcutaneous QHS Apryll Hinkle R, MD      . magnesium hydroxide (MILK OF MAGNESIA) suspension 30 mL  30 mL Oral Daily PRN Janica Eldred R, MD      . mirtazapine (REMERON) tablet 15 mg  15 mg Oral QHS Clapacs, Madie Reno, MD   15 mg at 08/22/17 2109  . nicotine (NICODERM CQ - dosed in mg/24 hours) patch 21 mg  21 mg Transdermal Daily Clapacs, Madie Reno, MD   21 mg at 08/23/17 0741  . OLANZapine (ZYPREXA) tablet 5 mg  5 mg Oral Q6H PRN Marylin Crosby, MD   5 mg at 08/22/17 1641  . ondansetron (ZOFRAN) tablet 4 mg  4 mg Oral Q6H PRN Clapacs, [name] T, MD      . traZODone (DESYREL) tablet 100 mg  100 mg Oral QHS Clapacs, Madie Reno, MD   100 mg at 08/22/17 2109  . venlafaxine XR (EFFEXOR-XR) 24 hr capsule 75 mg  75 mg Oral Q breakfast Kari Montero, Tyson Babinski, MD   75 mg at 08/23/17 1155    Lab Results:  Results for orders placed or performed during the hospital encounter of 08/22/17 (from the past 48 hour(s))  Glucose, capillary     Status: Abnormal   Collection Time: 08/22/17  4:36 PM  Result Value Ref Range   Glucose-Capillary 409 (H) 65 - 99 mg/dL  Glucose, capillary     Status: Abnormal   Collection Time: 08/22/17  9:12 PM  Result Value Ref Range   Glucose-Capillary 182 (H) 65 - 99 mg/dL  Lipid panel     Status: Abnormal   Collection Time: 08/23/17  6:36 AM  Result Value Ref Range   Cholesterol 340 (H) 0 - 200 mg/dL   Triglycerides 217 (H) <150  mg/dL   HDL 81 >40 mg/dL   Total CHOL/HDL Ratio 4.2 RATIO   VLDL 43 (  H) 0 - 40 mg/dL   LDL Cholesterol 216 (H) 0 - 99 mg/dL    Comment:        Total Cholesterol/HDL:CHD Risk Coronary Heart Disease Risk Table                     Men   Women  1/2 Average Risk   3.4   3.3  Average Risk       5.0   4.4  2 X Average Risk   9.6   7.1  3 X Average Risk  23.4   11.0        Use the calculated Patient Ratio above and the CHD Risk Table to determine the patient's CHD Risk.        ATP III CLASSIFICATION (LDL):  <100     mg/dL   Optimal  100-129  mg/dL   Near or Above                    Optimal  130-159  mg/dL   Borderline  160-189  mg/dL   High  >190     mg/dL   Very High   TSH     Status: Abnormal   Collection Time: 08/23/17  6:36 AM  Result Value Ref Range   TSH 20.961 (H) 0.350 - 4.500 uIU/mL    Comment: Performed by a 3rd Generation assay with a functional sensitivity of <=0.01 uIU/mL.  Basic metabolic panel     Status: Abnormal   Collection Time: 08/23/17  6:36 AM  Result Value Ref Range   Sodium 131 (L) 135 - 145 mmol/L   Potassium 4.8 3.5 - 5.1 mmol/L   Chloride 95 (L) 101 - 111 mmol/L   CO2 24 22 - 32 mmol/L   Glucose, Bld 474 (H) 65 - 99 mg/dL   BUN 26 (H) 6 - 20 mg/dL   Creatinine, Ser 0.82 0.61 - 1.24 mg/dL   Calcium 9.8 8.9 - 10.3 mg/dL   GFR calc non Af Amer >60 >60 mL/min   GFR calc Af Amer >60 >60 mL/min    Comment: (NOTE) The eGFR has been calculated using the CKD EPI equation. This calculation has not been validated in all clinical situations. eGFR's persistently <60 mL/min signify possible Chronic Kidney Disease.    Anion gap 12 5 - 15  CBC with Differential/Platelet     Status: Abnormal   Collection Time: 08/23/17  6:36 AM  Result Value Ref Range   WBC 8.0 3.8 - 10.6 K/uL   RBC 4.59 4.40 - 5.90 MIL/uL   Hemoglobin 15.3 13.0 - 18.0 g/dL   HCT 44.9 40.0 - 52.0 %   MCV 97.8 80.0 - 100.0 fL   MCH 33.3 26.0 - 34.0 pg   MCHC 34.0 32.0 - 36.0 g/dL   RDW  15.1 (H) 11.5 - 14.5 %   Platelets 283 150 - 440 K/uL   Neutrophils Relative % 63 %   Neutro Abs 5.1 1.4 - 6.5 K/uL   Lymphocytes Relative 26 %   Lymphs Abs 2.1 1.0 - 3.6 K/uL   Monocytes Relative 6 %   Monocytes Absolute 0.5 0.2 - 1.0 K/uL   Eosinophils Relative 4 %   Eosinophils Absolute 0.3 0 - 0.7 K/uL   Basophils Relative 1 %   Basophils Absolute 0.1 0 - 0.1 K/uL  T4, free     Status: None   Collection Time: 08/23/17  6:36 AM  Result Value Ref Range  Free T4 0.77 0.61 - 1.12 ng/dL    Comment: (NOTE) Biotin ingestion may interfere with free T4 tests. If the results are inconsistent with the TSH level, previous test results, or the clinical presentation, then consider biotin interference. If needed, order repeat testing after stopping biotin.   Glucose, capillary     Status: Abnormal   Collection Time: 08/23/17  7:19 AM  Result Value Ref Range   Glucose-Capillary 521 (HH) 65 - 99 mg/dL  Blood gas, arterial     Status: Abnormal   Collection Time: 08/23/17  8:07 AM  Result Value Ref Range   FIO2 0.21    pH, Arterial 7.35 7.350 - 7.450   pCO2 arterial 37 32.0 - 48.0 mmHg   pO2, Arterial 95 83.0 - 108.0 mmHg   Bicarbonate 20.4 20.0 - 28.0 mmol/L   Acid-base deficit 4.6 (H) 0.0 - 2.0 mmol/L   O2 Saturation 97.0 %   Patient temperature 37.0    Collection site RIGHT BRACHIAL    Sample type ARTERIAL DRAW    Allens test (pass/fail) PASS PASS  Glucose, capillary     Status: Abnormal   Collection Time: 08/23/17 11:22 AM  Result Value Ref Range   Glucose-Capillary 232 (H) 65 - 99 mg/dL    Blood Alcohol level:  Lab Results  Component Value Date   ETH <10 08/18/2017   ETH <5 54/00/8676    Metabolic Disorder Labs: Lab Results  Component Value Date   HGBA1C 9.2 (H) 11/25/2015   No results found for: PROLACTIN Lab Results  Component Value Date   CHOL 340 (H) 08/23/2017   TRIG 217 (H) 08/23/2017   HDL 81 08/23/2017   CHOLHDL 4.2 08/23/2017   VLDL 43 (H) 08/23/2017    LDLCALC 216 (H) 08/23/2017   LDLCALC 73 08/14/2013    Physical Findings:  Musculoskeletal: Strength & Muscle Tone: within normal limits Gait & Station: normal Patient leans: N/A  Psychiatric Specialty Exam: Physical Exam  Nursing note and vitals reviewed.   Review of Systems  All other systems reviewed and are negative.   Blood pressure 107/83, pulse 79, temperature 98.4 F (36.9 C), temperature source Oral, resp. rate 18, height _0  (1.88 m), weight 90.7 kg (200 lb), SpO2 99 %.Body mass index is 25.68 kg/m.  General Appearance: Disheveled  Eye Contact:  Poor  Speech:  Clear and Coherent  Volume:  Decreased  Mood:  Depressed and Irritable  Affect:  Depressed  Thought Process:  Goal Directed  Orientation:  Full (Time, Place, and Person)  Thought Content:  Negative  Suicidal Thoughts:  Yes.  with intent/plan  Homicidal Thoughts:  No  Memory:  Immediate;   Fair  Judgement:  Fair  Insight:  Fair  Psychomotor Activity:  Negative and NA  Concentration:  Concentration: Poor  Recall:  [employer] of Knowledge:  Fair  Language:  Fair  Akathisia:  No      Assets:  Communication Skills Resilience  ADL's:  Intact  Cognition:  WNL  Sleep:  Number of Hours: 7.15     Treatment Plan Summary: 31 yo male with history of depression. Pt continues to feel depressed and have suicidal thoughts with no plan at this time. He is hopeless that nothing will help him. He reports that he has felt depressed for many years and has had chronic suicidal thoughts. He agrees to a trial of Effexor in addition to continuing Remeron at this time.   Depression -Start Effexor XR 75 mg daily -Continue  Remeron 15 mg daily  Anxiety -Continue Gabapentin 900 mg TID  Insomnia -Continue Trazodone 100 mg qhs  Type I diabetes -Lantus 38 units qhs -Apart 3 units TID and sliding scale  Neuropathy -Continue Gabapentin 900 mg TID  Social/Dispo-he lives at boarding house where he pays weekly rent.  I attempted to call his sister who he identifies as his support system and left message-Rhonda [phone]    Marylin Crosby, MD 08/23/2017, 2:00 PM

## 2017-08-23 NOTE — [facility] Group Notes (Signed)
[facility] Group Notes:  (Nursing/MHT/Case Management/Adjunct)  Date:  08/23/2017  Time:  4:38 AM  Type of Therapy:  Psychoeducational Skills  Participation Level:  Did Not Attend  Summary of Progress/Problems:  Mark MilroyLaquanda Y Yanette [name] 08/23/2017, 4:38 AM

## 2017-08-23 NOTE — Progress Notes (Signed)
Patient ID: Mark [name], male   DOB: 07/12/1986, 31 y.o.   MRN: [id number]  Observed in room, unpleasant on approach, irritable, guarded, dismissive, refused to answer questions and was left alone. Refused to come out for POCT, CBG=182 obtained at bedside; medications given at this time.

## 2017-08-24 LAB — GLUCOSE, CAPILLARY
GLUCOSE-CAPILLARY: 250 mg/dL — AB (ref 65–99)
GLUCOSE-CAPILLARY: 221 mg/dL — AB (ref 65–99)
Glucose-Capillary: 347 mg/dL — ABNORMAL HIGH (ref 65–99)
Glucose-Capillary: 305 mg/dL — ABNORMAL HIGH (ref 65–99)

## 2017-08-24 MED ORDER — VENLAFAXINE HCL ER 75 MG PO CP24
150.0000 mg | ORAL_CAPSULE | Freq: Every day | ORAL | Status: DC
  Administered 2017-08-25 – 2017-08-29 (×5): 150 mg via ORAL
  Filled 2017-08-24 (×5): qty 2

## 2017-08-24 MED ORDER — INSULIN ASPART 100 UNIT/ML ~~LOC~~ SOLN
10.0000 [IU] | Freq: Three times a day (TID) | SUBCUTANEOUS | Status: DC
  Administered 2017-08-24 – 2017-08-25 (×3): 10 [IU] via SUBCUTANEOUS
  Filled 2017-08-24 (×2): qty 1

## 2017-08-24 MED ORDER — INSULIN GLARGINE 100 UNIT/ML ~~LOC~~ SOLN
45.0000 [IU] | Freq: Every day | SUBCUTANEOUS | Status: DC
  Administered 2017-08-24: 45 [IU] via SUBCUTANEOUS
  Filled 2017-08-24 (×2): qty 0.45

## 2017-08-24 NOTE — Plan of Care (Signed)
Problem: Education: Goal: Knowledge of [place] [facility] Education information/materials will improve Outcome: Progressing Patient Verbalized Goal: Emotional status will improve Outcome: Progressing Patient stated that he "felt better" during assessment.  Goal: Verbalization of understanding the information provided will improve Outcome: Progressing Patient verbalized

## 2017-08-24 NOTE — Progress Notes (Signed)
[facility] [place] [facility]   PATIENT NAME: Mark [name]    MR#:  [id number]  DATE OF BIRTH:  10/13/1986  SUBJECTIVE:   Patient was admitted to behavioral medicine secondary to depression with suicidal ideations. Hospitalist services were contacted for management of his diabetes. Patient complaining of increased thirst. Blood sugars have been somewhat labile.  REVIEW OF SYSTEMS:    Review of Systems  Constitutional: Negative for chills and fever.  HENT: Negative for congestion and tinnitus.   Eyes: Negative for blurred vision and double vision.  Respiratory: Negative for cough, shortness of breath and wheezing.   Cardiovascular: Negative for chest pain, orthopnea and PND.  Gastrointestinal: Negative for abdominal pain, diarrhea, nausea and vomiting.  Genitourinary: Negative for dysuria and hematuria.  Neurological: Negative for dizziness, sensory change and focal weakness.  All other systems reviewed and are negative.   Nutrition: Carb control Tolerating Diet: Yes Tolerating PT: Ambulatory  DRUG ALLERGIES:   Allergies  Allergen Reactions  . Penicillins Anaphylaxis, Hives and Swelling    Has patient had a PCN reaction causing immediate rash, facial/tongue/throat swelling, SOB or lightheadedness with hypotension: Yes Has patient had a PCN reaction causing severe rash involving mucus membranes or skin necrosis: No Has patient had a PCN reaction that required hospitalization Yes Has patient had a PCN reaction occurring within the last 10 years: Yes If all of the above answers are "NO", then may proceed with Cephalosporin use.  . Sulfa Antibiotics Anaphylaxis and Hives  . Clindamycin/Lincomycin Hives    VITALS:  Blood pressure 129/79, pulse (!) 105, temperature 98.8 F (37.1 C), temperature source Oral, resp. rate 18, height 6\' 2"  (1.88 m), weight 90.7 kg (200 lb), SpO2 99 %.  PHYSICAL EXAMINATION:   Physical Exam  GENERAL:  31 y.o.-year-old  patient sitting up in bed in NAD.  EYES: Pupils equal, round, reactive to light and accommodation. No scleral icterus. Extraocular muscles intact.  HEENT: Head atraumatic, normocephalic. Oropharynx and nasopharynx clear.  NECK:  Supple, no jugular venous distention. No thyroid enlargement, no tenderness.  LUNGS: Normal breath sounds bilaterally, no wheezing, rales, rhonchi. No use of accessory muscles of respiration.  CARDIOVASCULAR: S1, S2 normal. No murmurs, rubs, or gallops.  ABDOMEN: Soft, nontender, nondistended. Bowel sounds present. No organomegaly or mass.  EXTREMITIES: No cyanosis, clubbing or edema b/l.    NEUROLOGIC: Cranial nerves II through XII are intact. No focal Motor or sensory deficits b/l.   PSYCHIATRIC: The patient is alert and oriented x 3.  SKIN: No obvious rash, lesion, or ulcer.    LABORATORY PANEL:   CBC  Recent Labs Lab 08/23/17 0636  WBC 8.0  HGB 15.3  HCT 44.9  PLT 283   ------------------------------------------------------------------------------------------------------------------  Chemistries   Recent Labs Lab 08/19/17 0324  08/21/17 0450 08/23/17 0636  NA 134*  < > 134* 131*  K 3.4*  < > 4.4 4.8  CL 102  < > 100* 95*  CO2 21*  < > 26 24  GLUCOSE 106*  < > 305* 474*  BUN 10  < > 19 26*  CREATININE 0.88  < > 0.75 0.82  CALCIUM 8.3*  < > 9.0 9.8  MG  --   < > 1.7  --   AST 68*  --   --   --   ALT 78*  --   --   --   ALKPHOS 123  --   --   --   BILITOT 0.9  --   --   --   < > =  values in this interval not displayed. ------------------------------------------------------------------------------------------------------------------  Cardiac Enzymes No results for input(s): TROPONINI in the last 168 hours. ------------------------------------------------------------------------------------------------------------------  RADIOLOGY:  No results found.   ASSESSMENT AND PLAN:   31 year old male with past medical history of diabetes,  tobacco abuse, depression admitted to behavioral medicine due to suicidal ideations and depression.  1. Diabetes type 2 without complication with hyperglycemia-blood sugars are still somewhat labile. -We will increase his Lantus, increase his NovoLog coverage with meals, continue sliding scale insulin for now. -No evidence of DKA. Appreciated diabetes coordinator input.  2. Depression with suicidal ideations-continue further care as per psychiatry. -Continue Zyprexa, Remeron, Effexor, trazodone  3. Neuropathy-continue gabapentin.  4. Tobacco abuse-continue nicotine patch.     All the records are reviewed and case discussed with Care Management/Social Worker. Management plans discussed with the patient, family and they are in agreement.  CODE STATUS: Full  DVT Prophylaxis: Ambulatory  TOTAL TIME TAKING CARE OF THIS PATIENT: 30 minutes.   POSSIBLE D/C IN 2-3 DAYS, DEPENDING ON CLINICAL CONDITION.   Houston Siren M.D on 08/24/2017 at 12:23 PM  Between 7am to 6pm - Pager - [phone]  After 6pm go to [email or web address] - Social research officer, government  [facility] [place] [facility]  Office  [phone]  CC: Primary care physician; Patient, No Pcp Per

## 2017-08-24 NOTE — Progress Notes (Signed)
Data: Patient is alert and oriented, reports no AVH, and has denies SI/HI at this time. Patient is appropriate for mood and affect this morning, patient also has periods of agitation and anger. Patient has no physical complaints and a pain rating of 0/10. Patient reports "good" sleep for 8H25M, appetite is "fair". Patient rates depression "10/10" , Feelings of hopelessness "10/10" and Anxiety "9/10" Patients goal for today is "not to flip out."  Patient is frequently at nursing station door with demands, patient voiced that he was "aggitated" with staff because "they are telling me what I can and cannot have."  Action:  Q x 15 minute observation checks were completed for safety, Patient is on moderate fall risk. Patient was provided with education on medications. Patient educated on DM management and diet. Patient seen by medical physician to discuss insulin management. Patient was offered support and encouragement. Patient was given scheduled medications. Patient  was encourage to attend groups, participate in unit activities and continue with plan of care.    Response: Patient is compliant with medications, patient attended groups/unit activites. Patient has no complaints at this time. Patient is receptive to treatment and safety maintained on unit.

## 2017-08-24 NOTE — Progress Notes (Signed)
[facility] MD Progress Note  08/24/2017 2:29 PM Mark [name]  MRN:  [id number]  Subjective:   We had a long discussion with Mark [name] about his diabetes. He is asking for pain medication, the same he received in the ER. He is on neurontin here. He apparently had [facility] for his diabetes but lost it and needs to reapply. He has been out of Lantus for several months using Novolog that he obtaines from [place] without prescription. He is still anxious and depressed and does not believe any medications are helpful. Poor sleep due to polyuria.  Treatment plan. We will continue Remeron, Effexor and prn Zyprexa. Medicine input is apprecaited.  Social/disposition. He will return to his boarding house. He will follow up with [facility].  Principal Problem: Severe recurrent major depression without psychotic features ([place]) Diagnosis:   Patient Active Problem List   Diagnosis Date Noted  . MDD (major depressive disorder) [F32.9] 08/22/2017  . Severe recurrent major depression without psychotic features ([place]) [F33.2] 08/19/2017  . Nausea & vomiting [R11.2] 08/18/2017  . Abdominal pain [R10.9] 08/18/2017  . Hyponatremia [E87.1] 08/18/2017  . DKA (diabetic ketoacidoses) ([place]) [E13.10] 11/24/2015  . Diabetic keto-acidosis ([place]) [E13.10] 10/23/2015   Total Time spent with patient: 20 minutes  Past Psychiatric History: depression, mood instability.  Past Medical History:  Past Medical History:  Diagnosis Date  . Diabetes mellitus without complication ([place])   . Hydrocephalus     Past Surgical History:  Procedure Laterality Date  . KIDNEY STONE SURGERY     Family History:  Family History  Problem Relation Age of Onset  . Breast cancer Mother    Family Psychiatric  History: none reported. Social History:  History  Alcohol Use No    Comment: ocasional      History  Drug Use No    Social History   Social History  . Marital status: Single    Spouse name: N/A  . Number of children:  N/A  . Years of education: N/A   Social History Main Topics  . Smoking status: Current Every Day Smoker    Packs/day: 1.00    Types: Cigarettes  . Smokeless tobacco: Never Used  . Alcohol use No     Comment: ocasional   . Drug use: No  . Sexual activity: Not Asked   Other Topics Concern  . None   Social History Narrative  . None   Additional Social History:                         Sleep: Poor  Appetite:  Fair  Current Medications: Current Facility-Administered Medications  Medication Dose Route Frequency Provider Last Rate Last Dose  . acetaminophen (TYLENOL) tablet 650 mg  650 mg Oral Q6H PRN Clapacs, [name] T, MD      . alum & mag hydroxide-simeth (MAALOX/MYLANTA) 200-200-20 MG/5ML suspension 30 mL  30 mL Oral Q4H PRN Clapacs, [name] T, MD      . bisacodyl (DULCOLAX) suppository 10 mg  10 mg Rectal Daily PRN Clapacs, [name] T, MD      . diphenhydrAMINE (BENADRYL) capsule 50 mg  50 mg Oral QHS PRN Clapacs, [name] T, MD      . docusate sodium (COLACE) capsule 100 mg  100 mg Oral BID Clapacs, Madie Reno, MD   100 mg at 08/24/17 0727  . gabapentin (NEURONTIN) capsule 900 mg  900 mg Oral TID McNew, Tyson Babinski, MD   900 mg  at 08/24/17 1145  . hydrOXYzine (ATARAX/VISTARIL) tablet 50 mg  50 mg Oral TID PRN Marylin Crosby, MD   50 mg at 08/24/17 1146  . insulin aspart (novoLOG) injection 0-5 Units  0-5 Units Subcutaneous QHS Clapacs, Madie Reno, MD   5 Units at 08/23/17 2213  . insulin aspart (novoLOG) injection 0-9 Units  0-9 Units Subcutaneous TID WC Clapacs, Madie Reno, MD   7 Units at 08/24/17 1145  . insulin aspart (novoLOG) injection 10 Units  10 Units Subcutaneous TID WC Sainani, Vivek J, MD      . insulin glargine (LANTUS) injection 45 Units  45 Units Subcutaneous QHS Sainani, Vivek J, MD      . magnesium hydroxide (MILK OF MAGNESIA) suspension 30 mL  30 mL Oral Daily PRN McNew, Tyson Babinski, MD      . mirtazapine (REMERON) tablet 15 mg  15 mg Oral QHS Clapacs, Madie Reno, MD   15 mg at 08/23/17  2128  . nicotine (NICODERM CQ - dosed in mg/24 hours) patch 21 mg  21 mg Transdermal Daily Clapacs, [name] T, MD   21 mg at 08/24/17 0730  . OLANZapine (ZYPREXA) tablet 5 mg  5 mg Oral Q6H PRN Marylin Crosby, MD   5 mg at 08/22/17 1641  . ondansetron (ZOFRAN) tablet 4 mg  4 mg Oral Q6H PRN Clapacs, [name] T, MD      . traZODone (DESYREL) tablet 100 mg  100 mg Oral QHS Clapacs, Madie Reno, MD   100 mg at 08/23/17 2128  . venlafaxine XR (EFFEXOR-XR) 24 hr capsule 75 mg  75 mg Oral Q breakfast McNew, Tyson Babinski, MD   75 mg at 08/24/17 9323    Lab Results:  Results for orders placed or performed during the hospital encounter of 08/22/17 (from the past 48 hour(s))  Glucose, capillary     Status: Abnormal   Collection Time: 08/22/17  4:36 PM  Result Value Ref Range   Glucose-Capillary 409 (H) 65 - 99 mg/dL  Glucose, capillary     Status: Abnormal   Collection Time: 08/22/17  9:12 PM  Result Value Ref Range   Glucose-Capillary 182 (H) 65 - 99 mg/dL  Lipid panel     Status: Abnormal   Collection Time: 08/23/17  6:36 AM  Result Value Ref Range   Cholesterol 340 (H) 0 - 200 mg/dL   Triglycerides 217 (H) <150 mg/dL   HDL 81 >40 mg/dL   Total CHOL/HDL Ratio 4.2 RATIO   VLDL 43 (H) 0 - 40 mg/dL   LDL Cholesterol 216 (H) 0 - 99 mg/dL    Comment:        Total Cholesterol/HDL:CHD Risk Coronary Heart Disease Risk Table                     Men   Women  1/2 Average Risk   3.4   3.3  Average Risk       5.0   4.4  2 X Average Risk   9.6   7.1  3 X Average Risk  23.4   11.0        Use the calculated Patient Ratio above and the CHD Risk Table to determine the patient's CHD Risk.        ATP III CLASSIFICATION (LDL):  <100     mg/dL   Optimal  100-129  mg/dL   Near or Above  Optimal  130-159  mg/dL   Borderline  160-189  mg/dL   High  >190     mg/dL   Very High   TSH     Status: Abnormal   Collection Time: 08/23/17  6:36 AM  Result Value Ref Range   TSH 20.961 (H) 0.350 - 4.500  uIU/mL    Comment: Performed by a 3rd Generation assay with a functional sensitivity of <=0.01 uIU/mL.  Basic metabolic panel     Status: Abnormal   Collection Time: 08/23/17  6:36 AM  Result Value Ref Range   Sodium 131 (L) 135 - 145 mmol/L   Potassium 4.8 3.5 - 5.1 mmol/L   Chloride 95 (L) 101 - 111 mmol/L   CO2 24 22 - 32 mmol/L   Glucose, Bld 474 (H) 65 - 99 mg/dL   BUN 26 (H) 6 - 20 mg/dL   Creatinine, Ser 0.82 0.61 - 1.24 mg/dL   Calcium 9.8 8.9 - 10.3 mg/dL   GFR calc non Af Amer >60 >60 mL/min   GFR calc Af Amer >60 >60 mL/min    Comment: (NOTE) The eGFR has been calculated using the CKD EPI equation. This calculation has not been validated in all clinical situations. eGFR's persistently <60 mL/min signify possible Chronic Kidney Disease.    Anion gap 12 5 - 15  CBC with Differential/Platelet     Status: Abnormal   Collection Time: 08/23/17  6:36 AM  Result Value Ref Range   WBC 8.0 3.8 - 10.6 K/uL   RBC 4.59 4.40 - 5.90 MIL/uL   Hemoglobin 15.3 13.0 - 18.0 g/dL   HCT 44.9 40.0 - 52.0 %   MCV 97.8 80.0 - 100.0 fL   MCH 33.3 26.0 - 34.0 pg   MCHC 34.0 32.0 - 36.0 g/dL   RDW 15.1 (H) 11.5 - 14.5 %   Platelets 283 150 - 440 K/uL   Neutrophils Relative % 63 %   Neutro Abs 5.1 1.4 - 6.5 K/uL   Lymphocytes Relative 26 %   Lymphs Abs 2.1 1.0 - 3.6 K/uL   Monocytes Relative 6 %   Monocytes Absolute 0.5 0.2 - 1.0 K/uL   Eosinophils Relative 4 %   Eosinophils Absolute 0.3 0 - 0.7 K/uL   Basophils Relative 1 %   Basophils Absolute 0.1 0 - 0.1 K/uL  T4, free     Status: None   Collection Time: 08/23/17  6:36 AM  Result Value Ref Range   Free T4 0.77 0.61 - 1.12 ng/dL    Comment: (NOTE) Biotin ingestion may interfere with free T4 tests. If the results are inconsistent with the TSH level, previous test results, or the clinical presentation, then consider biotin interference. If needed, order repeat testing after stopping biotin.   Glucose, capillary     Status:  Abnormal   Collection Time: 08/23/17  7:19 AM  Result Value Ref Range   Glucose-Capillary 521 (HH) 65 - 99 mg/dL  Blood gas, arterial     Status: Abnormal   Collection Time: 08/23/17  8:07 AM  Result Value Ref Range   FIO2 0.21    pH, Arterial 7.35 7.350 - 7.450   pCO2 arterial 37 32.0 - 48.0 mmHg   pO2, Arterial 95 83.0 - 108.0 mmHg   Bicarbonate 20.4 20.0 - 28.0 mmol/L   Acid-base deficit 4.6 (H) 0.0 - 2.0 mmol/L   O2 Saturation 97.0 %   Patient temperature 37.0    Collection site RIGHT BRACHIAL  Sample type ARTERIAL DRAW    Allens test (pass/fail) PASS PASS  Glucose, capillary     Status: Abnormal   Collection Time: 08/23/17 11:22 AM  Result Value Ref Range   Glucose-Capillary 232 (H) 65 - 99 mg/dL  Glucose, capillary     Status: Abnormal   Collection Time: 08/23/17  4:43 PM  Result Value Ref Range   Glucose-Capillary 64 (L) 65 - 99 mg/dL  Glucose, capillary     Status: Abnormal   Collection Time: 08/23/17  5:36 PM  Result Value Ref Range   Glucose-Capillary 183 (H) 65 - 99 mg/dL  Glucose, capillary     Status: Abnormal   Collection Time: 08/23/17 10:06 PM  Result Value Ref Range   Glucose-Capillary 270 (H) 65 - 99 mg/dL  Glucose, capillary     Status: Abnormal   Collection Time: 08/24/17  7:02 AM  Result Value Ref Range   Glucose-Capillary 347 (H) 65 - 99 mg/dL  Glucose, capillary     Status: Abnormal   Collection Time: 08/24/17 11:41 AM  Result Value Ref Range   Glucose-Capillary 305 (H) 65 - 99 mg/dL    Blood Alcohol level:  Lab Results  Component Value Date   ETH <10 08/18/2017   ETH <5 81/85/6314    Metabolic Disorder Labs: Lab Results  Component Value Date   HGBA1C 9.2 (H) 11/25/2015   No results found for: PROLACTIN Lab Results  Component Value Date   CHOL 340 (H) 08/23/2017   TRIG 217 (H) 08/23/2017   HDL 81 08/23/2017   CHOLHDL 4.2 08/23/2017   VLDL 43 (H) 08/23/2017   LDLCALC 216 (H) 08/23/2017   LDLCALC 73 08/14/2013    Physical  Findings: AIMS:  , ,  ,  ,    CIWA:    COWS:     Musculoskeletal: Strength & Muscle Tone: within normal limits Gait & Station: normal Patient leans: N/A  Psychiatric Specialty Exam: Physical Exam  Nursing note and vitals reviewed. Psychiatric: His speech is normal and behavior is normal. Thought content normal. His mood appears anxious. His affect is blunt. Cognition and memory are normal. He expresses impulsivity. He exhibits a depressed mood.    Review of Systems  Neurological: Positive for sensory change.  Psychiatric/Behavioral: Positive for depression. The patient is nervous/anxious.   All other systems reviewed and are negative.   Blood pressure 129/79, pulse (!) 105, temperature 98.8 F (37.1 C), temperature source Oral, resp. rate 18, height 6' 2"  (1.88 m), weight 90.7 kg (200 lb), SpO2 99 %.Body mass index is 25.68 kg/m.  General Appearance: Casual  Eye Contact:  Good  Speech:  Clear and Coherent  Volume:  Normal  Mood:  Anxious and Depressed  Affect:  Blunt  Thought Process:  Goal Directed and Descriptions of Associations: Intact  Orientation:  Full (Time, Place, and Person)  Thought Content:  WDL  Suicidal Thoughts:  Yes.  with intent/plan  Homicidal Thoughts:  No  Memory:  Immediate;   Fair Recent;   Fair Remote;   Fair  Judgement:  Poor  Insight:  Lacking  Psychomotor Activity:  Decreased  Concentration:  Concentration: Fair and Attention Span: Fair  Recall:  [employer] of Knowledge:  Fair  Language:  Fair  Akathisia:  No  Handed:  Right  AIMS (if indicated):     Assets:  Communication Skills Desire for Improvement Housing Resilience Social Support  ADL's:  Intact  Cognition:  WNL  Sleep:  Number of Hours: 8.3  Treatment Plan Summary:  Daily contact with patient to assess and evaluate symptoms and progress in treatment and Medication management   31 yo male with history of depression. Pt continues to feel depressed and have suicidal  thoughts with no plan at this time. He is hopeless that nothing will help him. He reports that he has felt depressed for many years and has had chronic suicidal thoughts. He agrees to a trial of Effexor in addition to continuing Remeron at this time. Still depressed, suicidal and somatic. Tolerates medications well.  Depression -Increase Effexor to 150 mg daily -Continue Remeron 15 mg daily -Continue as needed Zyprexa  Anxiety -Continue Gabapentin 900 mg TID  Insomnia -Continue Trazodone 100 mg qhs  Type I diabetes -Lantus 45 units QHS per medicine consult -Novolog 10 units with meal -SSI, ADA diet  Neuropathy -Continue Gabapentin 900 mg TID  Social/Dispo -he lives at boarding house where he pays weekly rent. I attempted to call his sister who he identifies as his support system and left message-Rhonda [phone] -patient needs referral to our free pharmacy  Orson Slick, MD 08/24/2017, 2:29 PM

## 2017-08-24 NOTE — [facility] Group Notes (Signed)
[facility] LCSW Group Therapy 08/24/2017 1:15pm  Type of Therapy: Group Therapy- Feelings Around Discharge & Establishing a Supportive Framework  Participation Level:  ACTIVE  Description of Group:   What is a supportive framework? What does it look like feel like and how do I discern it from and unhealthy non-supportive network? Learn how to cope when supports are not helpful and don't support you. Discuss what to do when your family/friends are not supportive.  Summary of Patient Progress; Patient actively participated in group discussion. He reports he feels "a little worse". He states he is going through some family issues. Patient believes that an unhealthy non-supportive network is usually  people that take you away from your goals.    Therapeutic Modalities:   Cognitive Behavioral Therapy Person-Centered Therapy Motivational Interviewing   Zubin Pontillo  CUEBAS-COLON, LCWAS

## 2017-08-24 NOTE — [facility] Group Notes (Signed)
[facility] Group Notes:  (Nursing/MHT/Case Management/Adjunct)  Date:  08/24/2017  Time:  4:57 AM  Type of Therapy:  Psychoeducational Skills  Participation Level:  Did Not Attend  Summary of Progress/Problems:  Mark MilroyLaquanda Y Daphane [name] 08/24/2017, 4:57 AM

## 2017-08-24 NOTE — [facility] Group Notes (Signed)
[facility] Group Notes:  (Nursing/MHT/Case Management/Adjunct)  Date:  08/24/2017  Time:  11:03 PM  Type of Therapy:  Psychoeducational Skills  Participation Level:  None  Participation Quality:  Inattentive  Affect:  Appropriate  Cognitive:  Appropriate  Insight:  None  Engagement in Group:  None  Modes of Intervention:  Discussion, Socialization and Support  Summary of Progress/Problems:  Chancy MilroyLaquanda Y Luigi Stuckey 08/24/2017, 11:03 PM

## 2017-08-25 LAB — GLUCOSE, CAPILLARY
GLUCOSE-CAPILLARY: 114 mg/dL — AB (ref 65–99)
GLUCOSE-CAPILLARY: 371 mg/dL — AB (ref 65–99)
GLUCOSE-CAPILLARY: 228 mg/dL — AB (ref 65–99)
Glucose-Capillary: 65 mg/dL (ref 65–99)
Glucose-Capillary: 110 mg/dL — ABNORMAL HIGH (ref 65–99)

## 2017-08-25 MED ORDER — INSULIN ASPART 100 UNIT/ML ~~LOC~~ SOLN
12.0000 [IU] | Freq: Three times a day (TID) | SUBCUTANEOUS | Status: DC
  Administered 2017-08-25 – 2017-08-26 (×3): 12 [IU] via SUBCUTANEOUS
  Filled 2017-08-25: qty 1

## 2017-08-25 MED ORDER — INSULIN GLARGINE 100 UNIT/ML ~~LOC~~ SOLN
40.0000 [IU] | Freq: Every day | SUBCUTANEOUS | Status: DC
  Filled 2017-08-25: qty 0.4

## 2017-08-25 MED ORDER — GABAPENTIN 400 MG PO CAPS
1200.0000 mg | ORAL_CAPSULE | Freq: Three times a day (TID) | ORAL | Status: DC
  Administered 2017-08-25 – 2017-08-29 (×13): 1200 mg via ORAL
  Filled 2017-08-25 (×13): qty 3

## 2017-08-25 MED ORDER — INSULIN GLARGINE 100 UNIT/ML ~~LOC~~ SOLN
42.0000 [IU] | Freq: Every day | SUBCUTANEOUS | Status: DC
  Administered 2017-08-25 – 2017-08-27 (×3): 42 [IU] via SUBCUTANEOUS
  Filled 2017-08-25 (×4): qty 0.42

## 2017-08-25 NOTE — Progress Notes (Signed)
Patient attends groups; appetite good; communicates needs and wants to staff; denies SI, HI and hallucinations. Patient has chronic pain in bilateral lower extremities due to diabetes which has been long standing. Patient remains compliant with medications. Patient can be anxious and aggressive at times.

## 2017-08-25 NOTE — [facility] Group Notes (Signed)
LCSW Group Therapy Note  08/25/2017 1:15pm  Type of Therapy/Topic:  Group Therapy:  Balance in Life  Participation Level:  Active  Description of Group:    This group will address the concept of balance and how it feels and looks when one is unbalanced. Patients will be encouraged to process areas in their lives that are out of balance and identify reasons for remaining unbalanced. Facilitators will guide patients in utilizing problem-solving interventions to address and correct the stressor making their life unbalanced. Understanding and applying boundaries will be explored and addressed for obtaining and maintaining a balanced life. Patients will be encouraged to explore ways to assertively make their unbalanced needs known to significant others in their lives, using other group members and facilitator for support and feedback.  Therapeutic Goals: 1. Patient will identify two or more emotions or situations they have that consume much of in their lives. 2. Patient will identify signs/triggers that life has become out of balance:  3. Patient will identify two ways to set boundaries in order to achieve balance in their lives:  4. Patient will demonstrate ability to communicate their needs through discussion and/or role plays  Summary of Patient Progress: Pt was present for the duration of the group. Pt reports that he feels his life is currently unbalanced. Pt went on to explain that he has never felt balance in his life because his life has always been unpleasant. Pt reports that he worries excessively and struggles with anxiety which makes it difficult for him to experience balance.    Therapeutic Modalities:   Cognitive Behavioral Therapy Solution-Focused Therapy Assertiveness Training  Mark JordanLynn B Bethany Cumming, MSW, LCSWA 08/25/2017 2:36 PM

## 2017-08-25 NOTE — Progress Notes (Signed)
[facility] [place] [facility]   PATIENT NAME: Mark [name]    MR#:  [id number]  DATE OF BIRTH:  January 07, 1986  SUBJECTIVE:   Patient was admitted to behavioral medicine secondary to depression with suicidal ideations. Hospitalist services were contacted for management of his diabetes. BS improved since yesterday.  No other acute events overnight.   REVIEW OF SYSTEMS:    Review of Systems  Constitutional: Negative for chills and fever.  HENT: Negative for congestion and tinnitus.   Eyes: Negative for blurred vision and double vision.  Respiratory: Negative for cough, shortness of breath and wheezing.   Cardiovascular: Negative for chest pain, orthopnea and PND.  Gastrointestinal: Negative for abdominal pain, diarrhea, nausea and vomiting.  Genitourinary: Negative for dysuria and hematuria.  Neurological: Negative for dizziness, sensory change and focal weakness.  All other systems reviewed and are negative.   Nutrition: Carb control Tolerating Diet: Yes Tolerating PT: Ambulatory  DRUG ALLERGIES:   Allergies  Allergen Reactions  . Penicillins Anaphylaxis, Hives and Swelling    Has patient had a PCN reaction causing immediate rash, facial/tongue/throat swelling, SOB or lightheadedness with hypotension: Yes Has patient had a PCN reaction causing severe rash involving mucus membranes or skin necrosis: No Has patient had a PCN reaction that required hospitalization Yes Has patient had a PCN reaction occurring within the last 10 years: Yes If all of the above answers are "NO", then may proceed with Cephalosporin use.  . Sulfa Antibiotics Anaphylaxis and Hives  . Clindamycin/Lincomycin Hives    VITALS:  Blood pressure 127/77, pulse 90, temperature 98.1 F (36.7 C), temperature source Oral, resp. rate 18, height 6\' 2"  (1.88 m), weight 90.7 kg (200 lb), SpO2 100 %.  PHYSICAL EXAMINATION:   Physical Exam  GENERAL:  31 y.o.-year-old patient sitting up in bed  in NAD.  EYES: Pupils equal, round, reactive to light and accommodation. No scleral icterus. Extraocular muscles intact.  HEENT: Head atraumatic, normocephalic. Oropharynx and nasopharynx clear.  NECK:  Supple, no jugular venous distention. No thyroid enlargement, no tenderness.  LUNGS: Normal breath sounds bilaterally, no wheezing, rales, rhonchi. No use of accessory muscles of respiration.  CARDIOVASCULAR: S1, S2 normal. No murmurs, rubs, or gallops.  ABDOMEN: Soft, nontender, nondistended. Bowel sounds present. No organomegaly or mass.  EXTREMITIES: No cyanosis, clubbing or edema b/l.    NEUROLOGIC: Cranial nerves II through XII are intact. No focal Motor or sensory deficits b/l.   PSYCHIATRIC: The patient is alert and oriented x 3.  SKIN: No obvious rash, lesion, or ulcer.    LABORATORY PANEL:   CBC Recent Labs  Lab 08/23/17 0636  WBC 8.0  HGB 15.3  HCT 44.9  PLT 283   ------------------------------------------------------------------------------------------------------------------  Chemistries  Recent Labs  Lab 08/19/17 0324  08/21/17 0450 08/23/17 0636  NA 134*   < > 134* 131*  K 3.4*   < > 4.4 4.8  CL 102   < > 100* 95*  CO2 21*   < > 26 24  GLUCOSE 106*   < > 305* 474*  BUN 10   < > 19 26*  CREATININE 0.88   < > 0.75 0.82  CALCIUM 8.3*   < > 9.0 9.8  MG  --    < > 1.7  --   AST 68*  --   --   --   ALT 78*  --   --   --   ALKPHOS 123  --   --   --  BILITOT 0.9  --   --   --    < > = values in this interval not displayed.   ------------------------------------------------------------------------------------------------------------------  Cardiac Enzymes No results for input(s): TROPONINI in the last 168 hours. ------------------------------------------------------------------------------------------------------------------  RADIOLOGY:  No results found.   ASSESSMENT AND PLAN:   [age] year old male with past medical history of diabetes, tobacco abuse,  depression admitted to behavioral medicine due to suicidal ideations and depression.  1. Diabetes type 2 without complication with hyperglycemia- BS improved since yesterday.  - a.m. BS was in the 60's. Will decrease Lantus to 42 units as per Diabetes Coordinator, cont. SSI and will advance Novolog to 12 units TID w/ meals.  - cont. Carb control diet. Social work to help with getting his INsulin prescriptions upon discharge.   2. Depression with suicidal ideations-continue further care as per psychiatry. -Continue Zyprexa, Remeron, Effexor, trazodone  3. Neuropathy-continue gabapentin.  4. Tobacco abuse-continue nicotine patch.  Discussed w/ Dr. Jennet MaduroPucilowska and will sign off for now.  Call back if any further help needed.    All the records are reviewed and case discussed with Care Management/Social Worker. Management plans discussed with the patient, family and they are in agreement.  CODE STATUS: Full  DVT Prophylaxis: Ambulatory  TOTAL TIME TAKING CARE OF THIS PATIENT: 25 minutes.   POSSIBLE D/C IN 1-2 DAYS, DEPENDING ON CLINICAL CONDITION.   Houston SirenSAINANI,Spurgeon Gancarz J M.D on 08/25/2017 at 12:03 PM  Between 7am to 6pm - Pager - [phone]  After 6pm go to [email or web address] - Social research officer, governmentpassword EPAS [facility]  [facility] [place] [facility]  Office  [phone]  CC: Primary care physician; Patient, No Pcp Per

## 2017-08-25 NOTE — Progress Notes (Signed)
Pt. Turned on light for assistance.  Pt. Requested new sheet and change of clothing.  Pt. States he urinated some in bed.  Pt. Able to ambulate without assistance.  Pt. was able to take clothes to washing machine.

## 2017-08-25 NOTE — Plan of Care (Signed)
Problem:Engagement in activities will improve.  Goal: Patient will participate in groups  Outcome: Patient able to attend group meetings today and interact positively with other patients.

## 2017-08-25 NOTE — Progress Notes (Signed)
Patient called for change of bed sheets stating that he accidentally wet the bed, he is stable and responsive no further issues noted.

## 2017-08-25 NOTE — Progress Notes (Signed)
Results for Mark [name], [name] (MRN [id number]) as of 08/25/2017 10:53  Ref. Range 08/24/2017 11:41 08/24/2017 16:24 08/24/2017 21:16 08/25/2017 06:58 08/25/2017 07:18  Glucose-Capillary Latest Ref Range: 65 - 99 mg/dL 409305 (H) 811250 (H) 914221 (H) 65 114 (H)  Noted that fasting blood sugar was 65 mg/dl.  Recommend decreasing Lantus to 42 units daily.  Will continue to monitor blood sugars while in the hospital.   Smith MinceKendra Riyaan Heroux RN BSN CDE Diabetes Coordinator Pager: [phone]  8am-5pm

## 2017-08-25 NOTE — Progress Notes (Signed)
[facility] Progress Note  08/25/2017 6:36 PM Mark [name]  MRN:  [id number]  Subjective:  Mark [name] is much calmer today. He no longer asks for pain medications and is not as upset about his glucose control. Spoke with Dr. Cherlynn KaiserSainani who adjusted insulin again. The patient denies any symptoms of depression, anxiety or psychosis. He is not suicidal or homicidal. He hopes to be discharged tomorrow.  Treatment plan. We will continue Remeron, and Effexor 150 mg. Insulin dose was adjusted per medicine consultant.   Social/disposition. He will return to boarding house. Follow up with [facility]. Needs medication management application.  Principal Problem: Severe recurrent major depression without psychotic features (HCC) Diagnosis:   Patient Active Problem List   Diagnosis Date Noted  . MDD (major depressive disorder) [F32.9] 08/22/2017  . Severe recurrent major depression without psychotic features (HCC) [F33.2] 08/19/2017  . Nausea & vomiting [R11.2] 08/18/2017  . Abdominal pain [R10.9] 08/18/2017  . Hyponatremia [E87.1] 08/18/2017  . DKA (diabetic ketoacidoses) (HCC) [E13.10] 11/24/2015  . Diabetic keto-acidosis (HCC) [E13.10] 10/23/2015   Total Time spent with patient: 20 minutes  Past Psychiatric History: deression.  Past Medical History:  Past Medical History:  Diagnosis Date  . Diabetes mellitus without complication (HCC)   . Hydrocephalus     Past Surgical History:  Procedure Laterality Date  . KIDNEY STONE SURGERY     Family History:  Family History  Problem Relation Age of Onset  . Breast cancer Mother    Family Psychiatric  History: See H&P Social History:  Social History   Substance and Sexual Activity  Alcohol Use No   Comment: ocasional      Social History   Substance and Sexual Activity  Drug Use No    Social History   Socioeconomic History  . Marital status: Single    Spouse name: None  . Number of children: None  . Years of education: None  . Highest  education level: None  Social Needs  . Financial resource strain: None  . Food insecurity - worry: None  . Food insecurity - inability: None  . Transportation needs - medical: None  . Transportation needs - non-medical: None  Occupational History  . None  Tobacco Use  . Smoking status: Current Every Day Smoker    Packs/day: 1.00    Types: Cigarettes  . Smokeless tobacco: Never Used  Substance and Sexual Activity  . Alcohol use: No    Comment: ocasional   . Drug use: No  . Sexual activity: None  Other Topics Concern  . None  Social History Narrative  . None   Additional Social History:                         Sleep: Fair  Appetite:  Fair  Current Medications: Current Facility-Administered Medications  Medication Dose Route Frequency Provider Last Rate Last Dose  . acetaminophen (TYLENOL) tablet 650 mg  650 mg Oral Q6H PRN Clapacs, [name] T, MD      . alum & mag hydroxide-simeth (MAALOX/MYLANTA) 200-200-20 MG/5ML suspension 30 mL  30 mL Oral Q4H PRN Clapacs, [name] T, MD      . bisacodyl (DULCOLAX) suppository 10 mg  10 mg Rectal Daily PRN Clapacs, [name] T, MD      . diphenhydrAMINE (BENADRYL) capsule 50 mg  50 mg Oral QHS PRN Clapacs, [name] T, MD      . docusate sodium (COLACE) capsule 100 mg  100 mg Oral BID  Clapacs, Jackquline Denmark, MD   100 mg at 08/25/17 1605  . gabapentin (NEURONTIN) capsule 1,200 mg  1,200 mg Oral TID Jhoanna Heyde B, MD   1,200 mg at 08/25/17 1605  . hydrOXYzine (ATARAX/VISTARIL) tablet 50 mg  50 mg Oral TID PRN Haskell Riling, MD   50 mg at 08/24/17 1146  . insulin aspart (novoLOG) injection 0-5 Units  0-5 Units Subcutaneous QHS Clapacs, Jackquline Denmark, MD   5 Units at 08/24/17 2121  . insulin aspart (novoLOG) injection 0-9 Units  0-9 Units Subcutaneous TID WC Clapacs, Jackquline Denmark, MD   9 Units at 08/25/17 1131  . insulin aspart (novoLOG) injection 12 Units  12 Units Subcutaneous TID WC Houston Siren, MD   12 Units at 08/25/17 1603  . insulin glargine  (LANTUS) injection 42 Units  42 Units Subcutaneous QHS Sainani, Vivek J, MD      . magnesium hydroxide (MILK OF MAGNESIA) suspension 30 mL  30 mL Oral Daily PRN McNew, Ileene Hutchinson, MD      . mirtazapine (REMERON) tablet 15 mg  15 mg Oral QHS Clapacs, Jackquline Denmark, MD   15 mg at 08/24/17 2120  . nicotine (NICODERM CQ - dosed in mg/24 hours) patch 21 mg  21 mg Transdermal Daily Clapacs, Jackquline Denmark, MD   21 mg at 08/25/17 0754  . OLANZapine (ZYPREXA) tablet 5 mg  5 mg Oral Q6H PRN Haskell Riling, MD   5 mg at 08/25/17 1605  . ondansetron (ZOFRAN) tablet 4 mg  4 mg Oral Q6H PRN Clapacs, [name] T, MD      . traZODone (DESYREL) tablet 100 mg  100 mg Oral QHS Clapacs, Jackquline Denmark, MD   100 mg at 08/24/17 2120  . venlafaxine XR (EFFEXOR-XR) 24 hr capsule 150 mg  150 mg Oral Q breakfast Avyonna Wagoner B, MD   150 mg at 08/25/17 0745    Lab Results:  Results for orders placed or performed during the hospital encounter of 08/22/17 (from the past 48 hour(s))  Glucose, capillary     Status: Abnormal   Collection Time: 08/23/17 10:06 PM  Result Value Ref Range   Glucose-Capillary 270 (H) 65 - 99 mg/dL  Glucose, capillary     Status: Abnormal   Collection Time: 08/24/17  7:02 AM  Result Value Ref Range   Glucose-Capillary 347 (H) 65 - 99 mg/dL  Glucose, capillary     Status: Abnormal   Collection Time: 08/24/17 11:41 AM  Result Value Ref Range   Glucose-Capillary 305 (H) 65 - 99 mg/dL  Glucose, capillary     Status: Abnormal   Collection Time: 08/24/17  4:24 PM  Result Value Ref Range   Glucose-Capillary 250 (H) 65 - 99 mg/dL  Glucose, capillary     Status: Abnormal   Collection Time: 08/24/17  9:16 PM  Result Value Ref Range   Glucose-Capillary 221 (H) 65 - 99 mg/dL  Glucose, capillary     Status: None   Collection Time: 08/25/17  6:58 AM  Result Value Ref Range   Glucose-Capillary 65 65 - 99 mg/dL  Glucose, capillary     Status: Abnormal   Collection Time: 08/25/17  7:18 AM  Result Value Ref Range    Glucose-Capillary 114 (H) 65 - 99 mg/dL  Glucose, capillary     Status: Abnormal   Collection Time: 08/25/17 11:28 AM  Result Value Ref Range   Glucose-Capillary 371 (H) 65 - 99 mg/dL  Glucose, capillary  Status: Abnormal   Collection Time: 08/25/17  4:01 PM  Result Value Ref Range   Glucose-Capillary 110 (H) 65 - 99 mg/dL    Blood Alcohol level:  Lab Results  Component Value Date   ETH <10 08/18/2017   ETH <5 02/24/2016    Metabolic Disorder Labs: Lab Results  Component Value Date   HGBA1C 9.2 (H) 11/25/2015   No results found for: PROLACTIN Lab Results  Component Value Date   CHOL 340 (H) 08/23/2017   TRIG 217 (H) 08/23/2017   HDL 81 08/23/2017   CHOLHDL 4.2 08/23/2017   VLDL 43 (H) 08/23/2017   LDLCALC 216 (H) 08/23/2017   LDLCALC 73 08/14/2013    Physical Findings: AIMS:  , ,  ,  ,    CIWA:    COWS:     Musculoskeletal: Strength & Muscle Tone: within normal limits Gait & Station: normal Patient leans: N/A  Psychiatric Specialty Exam: Physical Exam  Nursing note and vitals reviewed. Psychiatric: He has a normal mood and affect. His speech is normal and behavior is normal. Thought content normal. Cognition and memory are normal. He expresses impulsivity.    Review of Systems  Neurological: Negative.   Psychiatric/Behavioral: Negative.   All other systems reviewed and are negative.   Blood pressure 127/77, pulse 90, temperature 98.1 F (36.7 C), temperature source Oral, resp. rate 18, height 6\' 2"  (1.88 m), weight 90.7 kg (200 lb), SpO2 100 %.Body mass index is 25.68 kg/m.  General Appearance: Casual  Eye Contact:  Good  Speech:  Clear and Coherent  Volume:  Normal  Mood:  Euthymic  Affect:  Appropriate  Thought Process:  Goal Directed and Descriptions of Associations: Intact  Orientation:  Full (Time, Place, and Person)  Thought Content:  WDL  Suicidal Thoughts:  No  Homicidal Thoughts:  No  Memory:  Immediate;   Fair Recent;   Fair Remote;    Fair  Judgement:  Impaired  Insight:  Shallow  Psychomotor Activity:  Normal  Concentration:  Concentration: Fair and Attention Span: Fair  Recall:  [employer] of Knowledge:  Fair  Language:  Fair  Akathisia:  No  Handed:  Right  AIMS (if indicated):     Assets:  Communication Skills Desire for Improvement Housing Resilience Social Support  ADL's:  Intact  Cognition:  WNL  Sleep:  Number of Hours: 7.15     Treatment Plan Summary: Daily contact with patient to assess and evaluate symptoms and progress in treatment and Medication management   31 yo male with history of depression. Pt continues to feel depressed and have suicidal thoughts with no plan at this time. He is hopeless that nothing will help him. He reports that he has felt depressed for many years and has had chronic suicidal thoughts. He agrees to a trial of Effexor in addition to continuing Remeron at this time. Still depressed, suicidal and somatic. Tolerates medications well.  Depression -Continue Effexor 150 mg daily  -Continue Remeron 15 mg daily -Continue as needed Zyprexa  Anxiety -Increase Gabapentin to 1200 mg TID  Insomnia -Continue Trazodone 100 mg qhs  Type I diabetes -Lantus 45 units QHS per medicine consult -Novolog 10 units with meal -SSI, ADA diet  Neuropathy -Increase Gabapentin to 1200 mg TID   Social/Dispo -he lives at boarding house where he pays weekly rent. I attempted to call his sister who he identifies as his support system and left message-Rhonda [phone] -patient needs referral to our free pharmacy  Kristine Linea, MD 08/25/2017, 6:36 PM

## 2017-08-26 LAB — GLUCOSE, CAPILLARY
Glucose-Capillary: 380 mg/dL — ABNORMAL HIGH (ref 65–99)
Glucose-Capillary: 236 mg/dL — ABNORMAL HIGH (ref 65–99)
Glucose-Capillary: 147 mg/dL — ABNORMAL HIGH (ref 65–99)
Glucose-Capillary: 219 mg/dL — ABNORMAL HIGH (ref 65–99)

## 2017-08-26 MED ORDER — INSULIN ASPART 100 UNIT/ML ~~LOC~~ SOLN
15.0000 [IU] | Freq: Three times a day (TID) | SUBCUTANEOUS | Status: DC
  Administered 2017-08-26 – 2017-08-29 (×6): 15 [IU] via SUBCUTANEOUS
  Filled 2017-08-26 (×4): qty 1

## 2017-08-26 NOTE — Progress Notes (Signed)
[facility] LCSW Group Therapy Note  Date/Time:08/26/17, 0930  Type of Therapy and Topic:  Group Therapy:  Overcoming Obstacles  Participation Level:  active  Description of Group:    In this group patients will be encouraged to explore what they see as obstacles to their own wellness and recovery. They will be guided to discuss their thoughts, feelings, and behaviors related to these obstacles. The group will process together ways to cope with barriers, with attention given to specific choices patients can make. Each patient will be challenged to identify changes they are motivated to make in order to overcome their obstacles. This group will be process-oriented, with patients participating in exploration of their own experiences as well as giving and receiving support and challenge from other group members.  Therapeutic Goals: 1. Patient will identify personal and current obstacles as they relate to admission. 2. Patient will identify barriers that currently interfere with their wellness or overcoming obstacles.  3. Patient will identify feelings, thought process and behaviors related to these barriers. 4. Patient will identify two changes they are willing to make to overcome these obstacles:    Summary of Patient Progress: Pt identified his ability to cope with mental health issues, grief/loss, and a lost job as current obstacles he is dealing with.  Pt was active participant in group discussion about difficult obstacles, how to not lose hope, and possible steps to take to overcome these obstacles.      Therapeutic Modalities:   Cognitive Behavioral Therapy Solution Focused Therapy Motivational Interviewing Relapse Prevention Therapy  Daleen SquibbGreg Jourden Gilson, LCSW

## 2017-08-26 NOTE — Progress Notes (Signed)
D: Affect cheerful Compliant  With unit programing . Interacting with his peers and staff  Patient stated slept good last night .Stated appetite is fair and energy level low. Stated concentration is poor . Stated on Depression scale  9, hopeless 10 and anxiety 10 .( low 0-10 high) Denies suicidal  homicidal ideations  .  No auditory hallucinations  No pain concerns . Appropriate ADL'S. Interacting with peers and staff.  Behavior on unit incongruent with statements above. Patient cheerful , appetite good , Recognize medications   A: Encourage patient participation with unit programming . Instruction  Given on  Medication , verbalize understanding. R: Voice no other concerns. Staff continue to monitor

## 2017-08-26 NOTE — BH Assessment (Signed)
This nurse was able to talk to patient in room.  Pt. States "I went to all group meeting today and feels like they are helping.  Pt. States "I feel I am getting better control of my diabetes while here".  Pt. States he wants more control over pych. meds  Pt. Appears to be in better more cooperative mood today and more willing to engage in conversation.

## 2017-08-26 NOTE — Plan of Care (Signed)
Attending  unit programing  voice no concerns around sleep , verbalize understanding of  information given from staff regarding [facility]  Mood less high  strung patient  thinking more logical. No acting out   on  unit . Noted to use coping skills  No isues around safety . Patient know to call staff for assistance . Feeling better about self doing hygiene.  Deceased anxiety

## 2017-08-26 NOTE — [facility] Suicide Risk Assessment (Signed)
[facility] INPATIENT:  Family/Significant Other Suicide Prevention Education  Suicide Prevention Education:  Education Completed; Blain PaisRhonda Gowin, sister, [phone], has been identified by the patient as the family member/significant other with whom the patient will be residing, and identified as the person(s) who will aid the patient in the event of a mental health crisis (suicidal ideations/suicide attempt).  With written consent from the patient, the family member/significant other has been provided the following suicide prevention education, prior to the and/or following the discharge of the patient.  The suicide prevention education provided includes the following:  Suicide risk factors  Suicide prevention and interventions  National Suicide Hotline telephone number  [facility] assessment telephone number  [facility] Assistance 911  [facility] and/or [facility]idential [facility] telephone number  Request made of family/significant other to:  Remove weapons (e.g., guns, rifles, knives), all items previously/currently identified as safety concern.  No guns in pt home, per [place].  Remove drugs/medications (over-the-counter, prescriptions, illicit drugs), all items previously/currently identified as a safety concern. Bjorn LoserRhonda does not believe pt has any excess medications.  The family member/significant other verbalizes understanding of the suicide prevention education information provided.  The family member/significant other agrees to remove the items of safety concern listed above.  Bjorn LoserRhonda reports she is currently at a medical rehab facility herself due to some health issues she is having.  She reports that pt has been "lost" since their mother died.  Pt lived with her and took care of her.  Pt has significant anxiety and trouble sleeping.  She talks to him on the phone frequently.  [facility] and she wants him to get follow up  afterwards.  Lorri FrederickWierda, Mekiah Wahler Jon, LCSW 08/26/2017, 2:17 PM

## 2017-08-26 NOTE — Progress Notes (Signed)
[facility]  08/26/2017 2:32 PM Derek JackMichael Q Tonelli  MRN:  [id number] Subjective:  Pt states that he is feeling slightly better. He has been getting out of his room and going to groups which he found helpful. He is taking care of himself better by showering and eating better. He admits that he wasn't eating too much outside of the hospital. He goes to the shelter for lunch and goes to food pantries but his food does not last him that long. He states that he is starting to feeling slightly more hopeful and "I want to really give things a try." He states that he is interested in therapy and is motivated to do what it takes to get better. He states that he hopes the medications will be helpful. We discussed that the medications can take up to 6 weeks to start working and he is understanding of this. He states that he has an upcoming court hearing that he needs to take care of. We looked up when his date is and it is in January. He states that he will probably choose to do the time which he thinks will be 45 days instead of probation because probation is much longer. He states that he has done time in jail and is not worried about it. It is for misdemeanor possession of marijuana. Pt is much less somatic today and states that he feels gabapentin has helped slightly for pain. This was increased to max dose recent and he is glad about this. He has been social with peers and seen laughing and smiling with them in the milieu. He does appear slightly brighter in affect today. He states that he has applied for disability and is hoping that he gets this. He also is expecting a check from unemployment soon. He enjoys the boarding house that he lives in. He rents a room in a big house and likes the people he lives with. He states that his sister is very supportive and they are close and they rely on each other for support.  Pt reports passive SI and states, "I don't really want to live but I also am scared to die and I  want to give living a try." He states that he is nervous that his blood sugars will be very high when he gets out and he won't notice because he is used to living so high. He states, "I'm really scared that next time my blood sugars go that high that I might die." He was encouraged to regularly check his sugars at home to monitor this and also it will be very important to see a PCP. He did meet with Lorella NimrodHarvey with [facility] and states that the meeting went very well and is looking forward [facility].  He denies, VH. He was able to identify that he enjoys volunteering at homeless shelters and really likes helping people and wants to continue this.   Principal Problem: Severe recurrent major depression without psychotic features (HCC) Diagnosis:   Patient Active Problem List   Diagnosis Date Noted  . MDD (major depressive disorder) [F32.9] 08/22/2017  . Severe recurrent major depression without psychotic features (HCC) [F33.2] 08/19/2017  . Nausea & vomiting [R11.2] 08/18/2017  . Abdominal pain [R10.9] 08/18/2017  . Hyponatremia [E87.1] 08/18/2017  . DKA (diabetic ketoacidoses) (HCC) [E13.10] 11/24/2015  . Diabetic keto-acidosis (HCC) [E13.10] 10/23/2015   Total Time spent with patient: 20 minutes  Past Psychiatric History: See H&P  Past Medical  History:  Past Medical History:  Diagnosis Date  . Diabetes mellitus without complication (HCC)   . Hydrocephalus     Past Surgical History:  Procedure Laterality Date  . KIDNEY STONE SURGERY     Family History:  Family History  Problem Relation Age of Onset  . Breast cancer Mother    Family Psychiatric  History: See H&P Social History:  Social History   Substance and Sexual Activity  Alcohol Use No   Comment: ocasional      Social History   Substance and Sexual Activity  Drug Use No    Social History   Socioeconomic History  . Marital status: Single    Spouse name: None  . Number of children: None  . Years of  education: None  . Highest education level: None  Social Needs  . Financial resource strain: None  . Food insecurity - worry: None  . Food insecurity - inability: None  . Transportation needs - medical: None  . Transportation needs - non-medical: None  Occupational History  . None  Tobacco Use  . Smoking status: Current Every Day Smoker    Packs/day: 1.00    Types: Cigarettes  . Smokeless tobacco: Never Used  Substance and Sexual Activity  . Alcohol use: No    Comment: ocasional   . Drug use: No  . Sexual activity: None  Other Topics Concern  . None  Social History Narrative  . None    Sleep: Good  Appetite:  Improving  Current Medications: Current Facility-Administered Medications  Medication Dose Route Frequency Provider Last Rate Last Dose  . acetaminophen (TYLENOL) tablet 650 mg  650 mg Oral Q6H PRN Clapacs, [name] T, MD      . alum & mag hydroxide-simeth (MAALOX/MYLANTA) 200-200-20 MG/5ML suspension 30 mL  30 mL Oral Q4H PRN Clapacs, [name] T, MD      . bisacodyl (DULCOLAX) suppository 10 mg  10 mg Rectal Daily PRN Clapacs, [name] T, MD      . diphenhydrAMINE (BENADRYL) capsule 50 mg  50 mg Oral QHS PRN Clapacs, [name] T, MD      . docusate sodium (COLACE) capsule 100 mg  100 mg Oral BID Clapacs, Jackquline Denmark, MD   100 mg at 08/26/17 0805  . gabapentin (NEURONTIN) capsule 1,200 mg  1,200 mg Oral TID Pucilowska, Jolanta B, MD   1,200 mg at 08/26/17 1145  . hydrOXYzine (ATARAX/VISTARIL) tablet 50 mg  50 mg Oral TID PRN Haskell Riling, MD   50 mg at 08/26/17 0829  . insulin aspart (novoLOG) injection 0-5 Units  0-5 Units Subcutaneous QHS Clapacs, Jackquline Denmark, MD   2 Units at 08/25/17 2206  . insulin aspart (novoLOG) injection 0-9 Units  0-9 Units Subcutaneous TID WC Clapacs, Jackquline Denmark, MD   3 Units at 08/26/17 1143  . insulin aspart (novoLOG) injection 15 Units  15 Units Subcutaneous TID WC Chalisa Kobler R, MD      . insulin glargine (LANTUS) injection 42 Units  42 Units Subcutaneous QHS  Houston Siren, MD   42 Units at 08/25/17 2207  . magnesium hydroxide (MILK OF MAGNESIA) suspension 30 mL  30 mL Oral Daily PRN Daley Gosse, Ileene Hutchinson, MD      . mirtazapine (REMERON) tablet 15 mg  15 mg Oral QHS Clapacs, Jackquline Denmark, MD   15 mg at 08/25/17 2206  . nicotine (NICODERM CQ - dosed in mg/24 hours) patch 21 mg  21 mg Transdermal Daily Clapacs, Jackquline Denmark, MD  21 mg at 08/26/17 0823  . OLANZapine (ZYPREXA) tablet 5 mg  5 mg Oral Q6H PRN Haskell Riling, MD   5 mg at 08/25/17 1605  . ondansetron (ZOFRAN) tablet 4 mg  4 mg Oral Q6H PRN Clapacs, [name] T, MD      . traZODone (DESYREL) tablet 100 mg  100 mg Oral QHS Clapacs, Jackquline Denmark, MD   100 mg at 08/25/17 2205  . venlafaxine XR (EFFEXOR-XR) 24 hr capsule 150 mg  150 mg Oral Q breakfast Pucilowska, Jolanta B, MD   150 mg at 08/26/17 0805    Lab Results:  Results for orders placed or performed during the hospital encounter of 08/22/17 (from the past 48 hour(s))  Glucose, capillary     Status: Abnormal   Collection Time: 08/24/17  4:24 PM  Result Value Ref Range   Glucose-Capillary 250 (H) 65 - 99 mg/dL  Glucose, capillary     Status: Abnormal   Collection Time: 08/24/17  9:16 PM  Result Value Ref Range   Glucose-Capillary 221 (H) 65 - 99 mg/dL  Glucose, capillary     Status: None   Collection Time: 08/25/17  6:58 AM  Result Value Ref Range   Glucose-Capillary 65 65 - 99 mg/dL  Glucose, capillary     Status: Abnormal   Collection Time: 08/25/17  7:18 AM  Result Value Ref Range   Glucose-Capillary 114 (H) 65 - 99 mg/dL  Glucose, capillary     Status: Abnormal   Collection Time: 08/25/17 11:28 AM  Result Value Ref Range   Glucose-Capillary 371 (H) 65 - 99 mg/dL  Glucose, capillary     Status: Abnormal   Collection Time: 08/25/17  4:01 PM  Result Value Ref Range   Glucose-Capillary 110 (H) 65 - 99 mg/dL  Glucose, capillary     Status: Abnormal   Collection Time: 08/25/17  9:37 PM  Result Value Ref Range   Glucose-Capillary 228 (H) 65 - 99  mg/dL  Glucose, capillary     Status: Abnormal   Collection Time: 08/26/17  7:57 AM  Result Value Ref Range   Glucose-Capillary 380 (H) 65 - 99 mg/dL   Comment 1 Document in Chart   Glucose, capillary     Status: Abnormal   Collection Time: 08/26/17 11:40 AM  Result Value Ref Range   Glucose-Capillary 236 (H) 65 - 99 mg/dL   Comment 1 Document in Chart     Blood Alcohol level:  Lab Results  Component Value Date   ETH <10 08/18/2017   ETH <5 02/24/2016    Metabolic Disorder Labs: Lab Results  Component Value Date   HGBA1C 9.2 (H) 11/25/2015   No results found for: PROLACTIN Lab Results  Component Value Date   CHOL 340 (H) 08/23/2017   TRIG 217 (H) 08/23/2017   HDL 81 08/23/2017   CHOLHDL 4.2 08/23/2017   VLDL 43 (H) 08/23/2017   LDLCALC 216 (H) 08/23/2017   LDLCALC 73 08/14/2013    Physical Findings: AIMS:  , ,  ,  ,    CIWA:    COWS:     Musculoskeletal: Strength & Muscle Tone: within normal limits Gait & Station: normal Patient leans: N/A  Psychiatric Specialty Exam: Physical Exam  Nursing note and vitals reviewed.   Review of Systems  All other systems reviewed and are negative.   Blood pressure 127/65, pulse 83, temperature 98.2 F (36.8 C), temperature source Oral, resp. rate 18, height 6\' 2"  (1.88 m), weight 90.7 kg (  200 lb), SpO2 100 %.Body mass index is 25.68 kg/m.  General Appearance: Casual  Eye Contact:  Good  Speech:  Clear and Coherent  Volume:  Normal  Mood:  Depressed  Affect:  Congruent  Thought Process:  Coherent  Orientation:  Full (Time, Place, and Person)  Thought Content:  Negative  Suicidal Thoughts:  Yes.  without intent/plan  Homicidal Thoughts:  No  Memory:  Immediate;   Fair  Judgement:  Fair  Insight:  Fair  Psychomotor Activity:  Normal  Concentration:  Concentration: Fair  Recall:  [employer] of Knowledge:  Fair  Language:  Fair  Akathisia:  No      Assets:  Communication Skills Desire for Improvement  ADL's:   Intact  Cognition:  WNL  Sleep:  Number of Hours: 7.15     Treatment Plan Summary: 31 yo male with history of depression admitted for SI. Pt reports slight improvement in mood and starting to gain back some hope for the future. Affect is appearing brighter. He is attending groups and socializing with peers. He is tolerating Effexor and Remeron and is hoping this will be effective. Somatic complaints are improving and less focused on this.   Depression -Continue Effexor XR 150 mg daily. This was increased over the weekend -Continue Remeron 15 mg qhs  Anxiety -Continue Gabapentin 1200 mg TID. This was increased over the weekend  Type I diabetes -Lantus was decreased to 42 Units  -Novolog increased to 15 units TID if pt eats at least 50 percent of meal  Insomnia -Continue trazodone 100 mg qhs  Neuropathy -Continue Gabapentin 1200 mg TID  Social/Dispo -He lives at boarding house where he pays weekly rent and will return there on discharge. He will follow up with [facility]. He identifies his sister, Bjorn Loser as his support system.  -He will need referral to free pharmacy  Haskell Riling, MD 08/26/2017, 2:32 PM

## 2017-08-26 NOTE — Progress Notes (Signed)
Recreation Therapy Notes  Date: 11.05.18  Time: 1:00 pm   Location: Craft Room  Behavioral response: Appropriate  Intervention Topic: Problem solving   Discussion/Intervention: Group content on today was focused on problem solving. The group described what problem solving is. Patients expressed how problems affect them and how they deal with problems. Individuals identified healthy ways to deal with problems. Patients explained what normally happens to them when they do not deal with problems. The group expressed reoccurring problems for them. The group participated in the intervention "Put the story together" with their peers and worked together to put a story that was broken up in the correct order.  Clinical Observations/Feedback:  Patient came to group and stated that when problem solving it is important to talk it out. Individual expressed making a pro and con list can be a positive way to solve problems. He was positive with peers and staff while engaged in group.   Teagan Heidrick LRT/CTRS         Makeda Peeks 08/26/2017 2:04 PM

## 2017-08-26 NOTE — Progress Notes (Signed)
Recreation Therapy Notes  Date: 11.05.18  Time: 3:00pm  Location: Craft room  Behavioral response: Appropriate  Group Type: Art/Craft,Game  Participation level: Active  Communication: Patient was social with peers and staff.  Comments: N/A  Samadhi Mahurin LRT/CTRS        Mark [name] 08/26/2017 4:09 PM 

## 2017-08-26 NOTE — Progress Notes (Signed)
Inpatient Diabetes Program Recommendations  AACE/ADA: New Consensus Statement on Inpatient Glycemic Control (2015)  Target Ranges:  Prepandial:   less than 140 mg/dL      Peak postprandial:   less than 180 mg/dL (1-2 hours)      Critically ill patients:  140 - 180 mg/dL   Results for Derek JackWILSON, Thorin Q (MRN [id number]) as of 08/26/2017 13:27  Ref. Range 08/25/2017 06:58 08/25/2017 07:18 08/25/2017 11:28 08/25/2017 16:01 08/25/2017 21:37  Glucose-Capillary Latest Ref Range: 65 - 99 mg/dL 65 409114 (H) 811371 (H) 914110 (H) 228 (H)   Results for Derek JackWILSON, Sonia Q (MRN [id number]) as of 08/26/2017 13:27  Ref. Range 08/26/2017 07:57 08/26/2017 11:40  Glucose-Capillary Latest Ref Range: 65 - 99 mg/dL 086380 (H) 578236 (H)    Home DM Meds: Lantus 50 units QHS                             Novolog 35 units QID  Current Insulin Orders: Lantus 42 units QHS                                       Novolog Sensitive Correction Scale/ SSI (0-9 units) TID AC + HS                                                    Novolog 12 units TID with meals      MD- Note patient was Hypoglycemic yesterday AM.  Lantus reduced slightly to 42 units QHS last PM.  Note that Novolog Meal Coverage increased to 12 units TID yesterday at 12pm.  Still having issues with postprandial hyperglycemia.  Please consider increasing Novolog Meal Coverage to: Novolog 15 units TID with meals (hold if pt eats <50% of meal)      --Will follow patient during hospitalization--  Ambrose FinlandJeannine Johnston Christe Tellez RN, MSN, CDE Diabetes Coordinator Inpatient Glycemic Control Team Team Pager: [phone] (8a-5p)

## 2017-08-27 LAB — GLUCOSE, CAPILLARY
GLUCOSE-CAPILLARY: 309 mg/dL — AB (ref 65–99)
GLUCOSE-CAPILLARY: 73 mg/dL (ref 65–99)
GLUCOSE-CAPILLARY: 181 mg/dL — AB (ref 65–99)
Glucose-Capillary: 125 mg/dL — ABNORMAL HIGH (ref 65–99)
Glucose-Capillary: 344 mg/dL — ABNORMAL HIGH (ref 65–99)
Glucose-Capillary: 216 mg/dL — ABNORMAL HIGH (ref 65–99)

## 2017-08-27 MED ORDER — MIRTAZAPINE 15 MG PO TABS
30.0000 mg | ORAL_TABLET | Freq: Every day | ORAL | Status: DC
  Administered 2017-08-27 – 2017-08-28 (×2): 30 mg via ORAL
  Filled 2017-08-27 (×2): qty 2

## 2017-08-27 MED ORDER — ZOLPIDEM TARTRATE 5 MG PO TABS
5.0000 mg | ORAL_TABLET | Freq: Every day | ORAL | Status: DC
  Administered 2017-08-27: 5 mg via ORAL
  Filled 2017-08-27: qty 1

## 2017-08-27 MED ORDER — TRAZODONE HCL 100 MG PO TABS: 100.0000 mg | ORAL_TABLET | Freq: Every evening | ORAL | Status: DC | PRN

## 2017-08-27 NOTE — [facility] Group Notes (Signed)
[facility] Group Notes:  (Nursing/MHT/Case Management/Adjunct)  Date:  08/27/2017  Time:  5:54 AM  Type of Therapy:  Psychoeducational Skills  Participation Level:  Active  Participation Quality:  Appropriate, Attentive and Sharing  Affect:  Appropriate  Cognitive:  Appropriate  Insight:  Appropriate and Good  Engagement in Group:  Engaged  Modes of Intervention:  Discussion, Socialization and Support  Summary of Progress/Problems:  Chancy MilroyLaquanda Y Arnold Depinto 08/27/2017, 5:54 AM

## 2017-08-27 NOTE — Plan of Care (Signed)
Patient was little irritable this morning because he did not have a good sleep.Patient is working on his discharge plan to have control over his anger and anxiety.

## 2017-08-27 NOTE — Progress Notes (Signed)
Recreation Therapy Notes  Date: 11.06.18  Time: 2:00pm  Location: Craft Room  Behavioral response: Appropriate  Intervention Topic: Anger Management  Discussion/Intervention: Group content on today was focused on anger management. The group defined anger and reasons they become angry. Individuals expressed negative way they have dealt with anger in the past. Patients stated some positive ways they could deal with anger in the future. The group described how anger can affect your health and daily plans. Individuals participated in the intervention "Score your anger" where they had a chance to answer questions about themselves and get a score of their anger.   Patient came to group and stated that anger is an emotion. He expressed that when he is angry he normally draws to calm down. Individual was positive with his peers and staff while in group.   Jotham Ahn LRT/CTRS        Leatrice Parilla 08/27/2017 4:13 PM

## 2017-08-27 NOTE — Progress Notes (Signed)
Inpatient Diabetes Program Recommendations  AACE/ADA: New Consensus Statement on Inpatient Glycemic Control (2015)  Target Ranges:  Prepandial:   less than 140 mg/dL      Peak postprandial:   less than 180 mg/dL (1-2 hours)      Critically ill patients:  140 - 180 mg/dL   Results for Mark [name], Mark [name] (MRN [id number]) as of 08/27/2017 13:12  Ref. Range 08/27/2017 07:11 08/27/2017 08:54 08/27/2017 11:29  Glucose-Capillary Latest Ref Range: 65 - 99 mg/dL 409125 (H) 73 811344 (H)    Review of Glycemic Control  Diabetes history: DM 2 Home DM Meds:Lantus 50 units QHS Novolog 35 units QID  Current Insulin Orders:Lantus 42 units QHS Novolog Sensitive Correction Scale/ SSI (0-9 units) TID AC + HS Novolog 15 units TID with meals     Insulin appropriately not given this am based on glucose levels (73 mg/dl). Glucose increased to 344 mg/dl at lunchtime and 0% of meal recorded in chart. Patient more than likely ate. Will watch to see trends after this dose of Novolog for lunchtime. No changes in regimen at this time.  Thanks,  Christena DeemShannon Johnye Kist RN, MSN, [facility] Inpatient Diabetes Coordinator Team Pager [phone] (8a-5p)

## 2017-08-27 NOTE — [facility] Group Notes (Signed)
LCSW Group Therapy Note 08/27/2017 9:00am  Type of Therapy and Topic:  Group Therapy:  Setting Goals  Participation Level:  Active  Description of Group: In this process group, patients discussed using strengths to work toward goals and address challenges.  Patients identified two positive things about themselves and one goal they were working on.  Patients were given the opportunity to share openly and support each other's plan for self-empowerment.  The group discussed the value of gratitude and were encouraged to have a daily reflection of positive characteristics or circumstances.  Patients were encouraged to identify a plan to utilize their strengths to work on current challenges and goals.  Therapeutic Goals 1. Patient will verbalize personal strengths/positive qualities and relate how these can assist with achieving desired personal goals 2. Patients will verbalize affirmation of peers plans for personal change and goal setting 3. Patients will explore the value of gratitude and positive focus as related to successful achievement of goals 4. Patients will verbalize a plan for regular reinforcement of personal positive qualities and circumstances.  Summary of Patient Progress:  Pt able to meet therapeutic goals listed above. His goal is to "get some rest as he has been up all night and doesn't feel well"     Therapeutic Modalities Cognitive Behavioral Therapy Motivational Interviewing    Glennon MacSara P Landon Truax, LCSW 08/27/2017 10:40 AM

## 2017-08-27 NOTE — Plan of Care (Signed)
  Progressing Activity: Interest or engagement in activities will improve 08/27/2017 2205 - Progressing by Addison Naegelieynolds, Jennett Tarbell I, RN Sleeping patterns will improve 08/27/2017 2205 - Progressing by Berkley Harveyeynolds, Harmoni Lucus I, RN Note Patient requested sleep medications Education: Knowledge of [facility] [facility] Education information/materials will improve 08/27/2017 2205 - Progressing by Addison Naegelieynolds, Culley Hedeen I, RN Emotional status will improve 08/27/2017 2205 - Progressing by Addison Naegelieynolds, Chelbi Herber I, RN Mental status will improve 08/27/2017 2205 - Progressing by Addison Naegelieynolds, Shahin Knierim I, RN Verbalization of understanding the information provided will improve 08/27/2017 2205 - Progressing by Addison Naegelieynolds, Pixie Burgener I, RN Coping: Ability to verbalize frustrations and anger appropriately will improve 08/27/2017 2205 - Progressing by Berkley Harveyeynolds, Pavneet Markwood I, RN Note Patient verbalized frustration with staff Ability to demonstrate self-control will improve 08/27/2017 2205 - Progressing by Berkley Harveyeynolds, Vee Bahe I, RN Health Behavior/Discharge Planning: Identification of resources available to assist in meeting health care needs will improve 08/27/2017 2205 - Progressing by Addison Naegelieynolds, Samanda Buske I, RN Compliance with treatment plan for underlying cause of condition will improve 08/27/2017 2205 - Progressing by Addison Naegelieynolds, Akbar Sacra I, RN Physical Regulation: Ability to maintain clinical measurements within normal limits will improve 08/27/2017 2205 - Progressing by Addison Naegelieynolds, Hrishikesh Hoeg I, RN Safety: Periods of time without injury will increase 08/27/2017 2205 - Progressing by Addison Naegelieynolds, Lee Kuang I, RN Role Relationship: Ability to demonstrate positive changes in social behaviors and relationships will improve 08/27/2017 2205 - Progressing by Addison Naegelieynolds, Luisalberto Beegle I, RN Self-Concept: Ability to verbalize positive feelings about self will improve 08/27/2017 2205 - Progressing by Addison Naegelieynolds, Latrenda Irani I, RN Level of anxiety will decrease 08/27/2017 2205 - Progressing by Berkley Harveyeynolds,  Sol Englert I, RN

## 2017-08-27 NOTE — [facility] Group Notes (Signed)
[facility] Group Notes:  (Nursing/MHT/Case Management/Adjunct)  Date:  08/27/2017  Time:  9:59 PM  Type of Therapy:  Group Therapy  Participation Level:  Active  Participation Quality:  Appropriate  Affect:  Appropriate  Cognitive:  Alert  Insight:  Good  Engagement in Group:  Engaged  Modes of Intervention:  Support  Summary of Progress/Problems:  Mark [name] 08/27/2017, 9:59 PM

## 2017-08-27 NOTE — Progress Notes (Signed)
D: Pt denies SI/HI/AVH. Pt is pleasant and cooperative. Patient has complaints of restlessness and interrupted sleep. Patient educated on new medications and DM control through diet.  A: Q x 15 minute observation checks were completed for safety, moderate fall precausions. Patient was provided with education on medications. Patient was offered support and encouragement. Patient was given scheduled medications. Patient  was encourage to attend groups, participate in unit activities and continue with plan of care.   R: Patient is complaint with medication. Patient has no complaints at this time. Patient  receptive to treatment and safety maintained on unit.

## 2017-08-27 NOTE — [facility] Group Notes (Signed)
[facility] Group Notes:  (Nursing/MHT/Case Management/Adjunct)  Date:  08/27/2017  Time:  2:07 PM  Type of Therapy:  Psychoeducational Skills  Participation Level:  Active  Participation Quality:  Appropriate  Affect:  Appropriate  Cognitive:  Appropriate  Insight:  Appropriate  Engagement in Group:  Engaged  Modes of Intervention:  Discussion and Education  Summary of Progress/Problems:  Mark [name] M Mark [name] 08/27/2017, 2:07 PM

## 2017-08-27 NOTE — Progress Notes (Signed)
[facility] MD Progress Note  08/27/2017 2:20 PM Mark [name]  MRN:  [id number]   Subjective:  Pt states that he is "about the same." He states that he is still feeling depressed with "no difference in mood." Despite this, pt does appear to have brighter affect and is much more social with peers and attending groups and participating. He feels that groups have been helpful. He states that he wants to discharge soon but is also anxious about this because he "wants something to go right." he states that he applied for disability and is hoping to get this. He does have unemployment and thinks he has a check waiting for him. He is excited to meet with Lorella Nimrod on Friday and states, "he is a really good guy." He is happy that Lorella Nimrod brought him some shoes. Pt states that he is still not sleeping well at all. He wakes up multiple times to go to the bathroom and can't get back to sleep. He states that he is feeling exhausted during the day due to this. He endorses passive SI with no plan. He states, "These thoughts are always there." He is upset because he wrote on his paper yesterday that he has high levels of suicidal thoughts and his nurse questioned him. HE states that he did not understand the question and explained to her that he always has suicidal thoughts and this is not a new thing. Pt states that he will feel ready to discharge on Thursday so he will be able to meet with Lorella Nimrod the next day.   Principal Problem: Severe recurrent major depression without psychotic features (HCC) Diagnosis:   Patient Active Problem List   Diagnosis Date Noted  . MDD (major depressive disorder) [F32.9] 08/22/2017  . Severe recurrent major depression without psychotic features (HCC) [F33.2] 08/19/2017  . Nausea & vomiting [R11.2] 08/18/2017  . Abdominal pain [R10.9] 08/18/2017  . Hyponatremia [E87.1] 08/18/2017  . DKA (diabetic ketoacidoses) (HCC) [E13.10] 11/24/2015  . Diabetic keto-acidosis (HCC) [E13.10] 10/23/2015    Total Time spent with patient: 20 minutes  Past Psychiatric History: See H&P  Past Medical History:  Past Medical History:  Diagnosis Date  . Diabetes mellitus without complication (HCC)   . Hydrocephalus     Past Surgical History:  Procedure Laterality Date  . KIDNEY STONE SURGERY     Family History:  Family History  Problem Relation Age of Onset  . Breast cancer Mother    Family Psychiatric  History: See H&P Social History:  Social History   Substance and Sexual Activity  Alcohol Use No   Comment: ocasional      Social History   Substance and Sexual Activity  Drug Use No    Social History   Socioeconomic History  . Marital status: Single    Spouse name: None  . Number of children: None  . Years of education: None  . Highest education level: None  Social Needs  . Financial resource strain: None  . Food insecurity - worry: None  . Food insecurity - inability: None  . Transportation needs - medical: None  . Transportation needs - non-medical: None  Occupational History  . None  Tobacco Use  . Smoking status: Current Every Day Smoker    Packs/day: 1.00    Types: Cigarettes  . Smokeless tobacco: Never Used  Substance and Sexual Activity  . Alcohol use: No    Comment: ocasional   . Drug use: No  . Sexual activity: None  Other Topics  Concern  . None  Social History Narrative  . None    Sleep: Poor  Appetite:  Fair  Current Medications: Current Facility-Administered Medications  Medication Dose Route Frequency Provider Last Rate Last Dose  . acetaminophen (TYLENOL) tablet 650 mg  650 mg Oral Q6H PRN Clapacs, [name] T, MD      . alum & mag hydroxide-simeth (MAALOX/MYLANTA) 200-200-20 MG/5ML suspension 30 mL  30 mL Oral Q4H PRN Clapacs, [name] T, MD      . bisacodyl (DULCOLAX) suppository 10 mg  10 mg Rectal Daily PRN Clapacs, [name] T, MD      . diphenhydrAMINE (BENADRYL) capsule 50 mg  50 mg Oral QHS PRN Clapacs, [name] T, MD      . docusate sodium  (COLACE) capsule 100 mg  100 mg Oral BID Clapacs, Jackquline DenmarkJohn T, MD   100 mg at 08/27/17 0854  . gabapentin (NEURONTIN) capsule 1,200 mg  1,200 mg Oral TID Pucilowska, Jolanta B, MD   1,200 mg at 08/27/17 1132  . hydrOXYzine (ATARAX/VISTARIL) tablet 50 mg  50 mg Oral TID PRN Haskell RilingMcNew, Holly R, MD   50 mg at 08/26/17 1753  . insulin aspart (novoLOG) injection 0-5 Units  0-5 Units Subcutaneous QHS Clapacs, Jackquline DenmarkJohn T, MD   2 Units at 08/26/17 2122  . insulin aspart (novoLOG) injection 0-9 Units  0-9 Units Subcutaneous TID WC Clapacs, Jackquline DenmarkJohn T, MD   7 Units at 08/27/17 1134  . insulin aspart (novoLOG) injection 15 Units  15 Units Subcutaneous TID WC McNew, Ileene HutchinsonHolly R, MD   15 Units at 08/27/17 1134  . insulin glargine (LANTUS) injection 42 Units  42 Units Subcutaneous QHS Houston SirenSainani, Vivek J, MD   42 Units at 08/26/17 2121  . magnesium hydroxide (MILK OF MAGNESIA) suspension 30 mL  30 mL Oral Daily PRN McNew, Ileene HutchinsonHolly R, MD      . mirtazapine (REMERON) tablet 30 mg  30 mg Oral QHS McNew, Holly R, MD      . nicotine (NICODERM CQ - dosed in mg/24 hours) patch 21 mg  21 mg Transdermal Daily Clapacs, Jackquline DenmarkJohn T, MD   21 mg at 08/27/17 0857  . OLANZapine (ZYPREXA) tablet 5 mg  5 mg Oral Q6H PRN Haskell RilingMcNew, Holly R, MD   5 mg at 08/26/17 2120  . ondansetron (ZOFRAN) tablet 4 mg  4 mg Oral Q6H PRN Clapacs, [name] T, MD      . traZODone (DESYREL) tablet 100 mg  100 mg Oral QHS PRN McNew, Ileene HutchinsonHolly R, MD      . venlafaxine XR (EFFEXOR-XR) 24 hr capsule 150 mg  150 mg Oral Q breakfast Pucilowska, Jolanta B, MD   150 mg at 08/27/17 0853  . zolpidem (AMBIEN) tablet 5 mg  5 mg Oral QHS McNew, Ileene HutchinsonHolly R, MD        Lab Results:  Results for orders placed or performed during the hospital encounter of 08/22/17 (from the past 48 hour(s))  Glucose, capillary     Status: Abnormal   Collection Time: 08/25/17  4:01 PM  Result Value Ref Range   Glucose-Capillary 110 (H) 65 - 99 mg/dL  Glucose, capillary     Status: Abnormal   Collection Time: 08/25/17  9:37  PM  Result Value Ref Range   Glucose-Capillary 228 (H) 65 - 99 mg/dL  Glucose, capillary     Status: Abnormal   Collection Time: 08/26/17  7:57 AM  Result Value Ref Range   Glucose-Capillary 380 (H) 65 - 99 mg/dL  Comment 1 Document in Chart   Glucose, capillary     Status: Abnormal   Collection Time: 08/26/17 11:40 AM  Result Value Ref Range   Glucose-Capillary 236 (H) 65 - 99 mg/dL   Comment 1 Document in Chart   Glucose, capillary     Status: Abnormal   Collection Time: 08/26/17  4:28 PM  Result Value Ref Range   Glucose-Capillary 147 (H) 65 - 99 mg/dL  Glucose, capillary     Status: Abnormal   Collection Time: 08/26/17  9:12 PM  Result Value Ref Range   Glucose-Capillary 219 (H) 65 - 99 mg/dL  Glucose, capillary     Status: Abnormal   Collection Time: 08/27/17  2:15 AM  Result Value Ref Range   Glucose-Capillary 309 (H) 65 - 99 mg/dL   Comment 1 Notify RN   Glucose, capillary     Status: Abnormal   Collection Time: 08/27/17  7:11 AM  Result Value Ref Range   Glucose-Capillary 125 (H) 65 - 99 mg/dL   Comment 1 Notify RN   Glucose, capillary     Status: None   Collection Time: 08/27/17  8:54 AM  Result Value Ref Range   Glucose-Capillary 73 65 - 99 mg/dL  Glucose, capillary     Status: Abnormal   Collection Time: 08/27/17 11:29 AM  Result Value Ref Range   Glucose-Capillary 344 (H) 65 - 99 mg/dL    Blood Alcohol level:  Lab Results  Component Value Date   ETH <10 08/18/2017   ETH <5 02/24/2016    Metabolic Disorder Labs: Lab Results  Component Value Date   HGBA1C 9.2 (H) 11/25/2015   No results found for: PROLACTIN Lab Results  Component Value Date   CHOL 340 (H) 08/23/2017   TRIG 217 (H) 08/23/2017   HDL 81 08/23/2017   CHOLHDL 4.2 08/23/2017   VLDL 43 (H) 08/23/2017   LDLCALC 216 (H) 08/23/2017   LDLCALC 73 08/14/2013    Physical Findings: AIMS:  , ,  ,  ,    CIWA:    COWS:     Musculoskeletal: Strength & Muscle Tone: within normal  limits Gait & Station: normal Patient leans: N/A  Psychiatric Specialty Exam: Physical Exam  Nursing note and vitals reviewed.   Review of Systems  All other systems reviewed and are negative.   Blood pressure 122/74, pulse 80, temperature 98.2 F (36.8 C), temperature source Oral, resp. rate 18, height 6\' 2"  (1.88 m), weight 90.7 kg (200 lb), SpO2 99 %.Body mass index is 25.68 kg/m.  General Appearance: Casual  Eye Contact:  Fair  Speech:  Clear and Coherent  Volume:  Normal  Mood:  Depressed  Affect:  Congruent but brighter than on admission  Thought Process:  Coherent  Orientation:  Full (Time, Place, and Person)  Thought Content:  Negative  Suicidal Thoughts:  Yes.  without intent/plan  Homicidal Thoughts:  No  Memory:  Immediate;   Fair  Judgement:  Fair  Insight:  Fair  Psychomotor Activity:  Negative  Concentration:  Concentration: Fair  Recall:  [employer] of Knowledge:  Fair  Language:  Fair  Akathisia:  No      Assets:  Communication Skills Desire for Improvement Resilience  ADL's:  Intact  Cognition:  WNL  Sleep:  Number of Hours: 5     Treatment Plan Summary: 31 yo male with history of depression admitted due to SI. Pt reports no change in mood however yesterday he did say  he felt slightly better. His affect does appear much brighter than on admission and he is much less irritable. He is participating in groups and has found them very helpful. He reports passive SI with no plan or intent. He Is future oriented and looking forward to meeting harvey with [facility] on Friday and feels this will be helpful. He is tolerating Effexor and Remeron. He is still sleeping poorly and this has affected his mood.  MDD -Continue Effexor XR 150 mg daily -Increase Remeron to 30 mg daily  Anxiety -Continue Gabapentin 1200 mg TID  Type I Diabetes -Diabetes care coordinators on board -Continue Lantus 42 units -Continue Novolog 15 units TID if pt eats at least 50 percent of  meal  Insomnia -Start Ambien 5 mg qhs -trazodone prn if Ambien not effective  Neuropathy -Continue Gabapentin 1200 mg TID  Social/Dispo -He lives at boarding house where he pays weekly rent and will return there on discharge. He will follow up with [facility]. He identifies his sister, Bjorn LoserRhonda as his support system.  -He will need referral to free pharmacy    Haskell RilingHolly R McNew, MD 08/27/2017, 2:20 PM

## 2017-08-27 NOTE — Progress Notes (Signed)
Recreation Therapy Notes  Date: 11.06.18  Time: 3:00pm  Location: Craft room  Behavioral response: Appropriate  Group Type: Art/Craft  Participation level: Active  Communication: Patient was social with peers and staff.  Comments: N/A  Kennette Cuthrell LRT/CTRS        Brok Stocking 08/27/2017 4:32 PM

## 2017-08-28 LAB — GLUCOSE, CAPILLARY
Glucose-Capillary: 56 mg/dL — ABNORMAL LOW (ref 65–99)
Glucose-Capillary: 62 mg/dL — ABNORMAL LOW (ref 65–99)
Glucose-Capillary: 132 mg/dL — ABNORMAL HIGH (ref 65–99)
Glucose-Capillary: 371 mg/dL — ABNORMAL HIGH (ref 65–99)
Glucose-Capillary: 59 mg/dL — ABNORMAL LOW (ref 65–99)
Glucose-Capillary: 69 mg/dL (ref 65–99)
Glucose-Capillary: 110 mg/dL — ABNORMAL HIGH (ref 65–99)
Glucose-Capillary: 342 mg/dL — ABNORMAL HIGH (ref 65–99)

## 2017-08-28 MED ORDER — MIRTAZAPINE 30 MG PO TABS: 30.0000 mg | ORAL_TABLET | Freq: Every day | ORAL | 2 refills | Status: DC

## 2017-08-28 MED ORDER — GABAPENTIN 400 MG PO CAPS: 1200.0000 mg | ORAL_CAPSULE | Freq: Three times a day (TID) | ORAL | 2 refills | Status: DC

## 2017-08-28 MED ORDER — TRAZODONE HCL 100 MG PO TABS: 100.0000 mg | ORAL_TABLET | Freq: Every evening | ORAL | 2 refills | Status: DC | PRN

## 2017-08-28 MED ORDER — INSULIN ASPART 100 UNIT/ML ~~LOC~~ SOLN: 0.0000 [IU] | Freq: Every day | SUBCUTANEOUS | 0 refills | Status: DC

## 2017-08-28 MED ORDER — INSULIN ASPART 100 UNIT/ML ~~LOC~~ SOLN: 0.0000 [IU] | Freq: Three times a day (TID) | SUBCUTANEOUS | 0 refills | Status: DC

## 2017-08-28 MED ORDER — INSULIN GLARGINE 100 UNIT/ML ~~LOC~~ SOLN: 38.0000 [IU] | Freq: Every day | SUBCUTANEOUS | 1 refills | Status: DC

## 2017-08-28 MED ORDER — VENLAFAXINE HCL ER 150 MG PO CP24: 150.0000 mg | ORAL_CAPSULE | Freq: Every day | ORAL | 2 refills | Status: DC

## 2017-08-28 MED ORDER — ZOLPIDEM TARTRATE 5 MG PO TABS
10.0000 mg | ORAL_TABLET | Freq: Every day | ORAL | Status: DC
  Administered 2017-08-28: 10 mg via ORAL
  Filled 2017-08-28: qty 2

## 2017-08-28 MED ORDER — INSULIN GLARGINE 100 UNIT/ML ~~LOC~~ SOLN: 38.0000 [IU] | Freq: Every day | SUBCUTANEOUS | 0 refills | Status: DC

## 2017-08-28 MED ORDER — INSULIN ASPART 100 UNIT/ML ~~LOC~~ SOLN: 15.0000 [IU] | Freq: Three times a day (TID) | SUBCUTANEOUS | 0 refills | Status: DC

## 2017-08-28 MED ORDER — ZOLPIDEM TARTRATE 10 MG PO TABS: 10.0000 mg | ORAL_TABLET | Freq: Every day | ORAL | 1 refills | Status: DC

## 2017-08-28 MED ORDER — INSULIN GLARGINE 100 UNIT/ML ~~LOC~~ SOLN
38.0000 [IU] | Freq: Every day | SUBCUTANEOUS | Status: DC
  Administered 2017-08-28: 38 [IU] via SUBCUTANEOUS
  Filled 2017-08-28 (×2): qty 0.38

## 2017-08-28 NOTE — Progress Notes (Signed)
Inpatient Diabetes Program Recommendations  AACE/ADA: New Consensus Statement on Inpatient Glycemic Control (2015)  Target Ranges:  Prepandial:   less than 140 mg/dL      Peak postprandial:   less than 180 mg/dL (1-2 hours)      Critically ill patients:  140 - 180 mg/dL   Results for Mark [name], Mark [name] (MRN [id number]) as of 08/28/2017 09:23  Ref. Range 08/27/2017 07:11 08/27/2017 08:54 08/27/2017 11:29 08/27/2017 16:38 08/27/2017 21:06 08/28/2017 07:04 08/28/2017 07:22 08/28/2017 07:42  Glucose-Capillary Latest Ref Range: 65 - 99 mg/dL 782125 (H) 73 956344 (H) 213216 (H) 181 (H) 56 (L) 62 (L) 132 (H)   Review of Glycemic Control  Diabetes history: DM 2 Home DM Meds:Lantus 50 units QHS Novolog 35 units QID  Current Insulin Orders:Lantus42units QHS Novolog Sensitive Correction Scale/ SSI (0-9 units) TID AC + HS Novolog15units TID with meals  Inpatient Diabetes Program Recommendations:    Hypoglycemia with fasting glucose this am. Please consider decreasing Lantus to 38 units. Continue same meal coverage dose.  Due to hypoglycemia, meal coverage will most likely be held, expect to see elevation at lunchtime due to absence of meal coverage, which would be appropriately held due to hypoglycemia this am.   Thanks,  Christena DeemShannon Kelcie Currie RN, MSN, [facility] Inpatient Diabetes Coordinator Team Pager [phone] (8a-5p)

## 2017-08-28 NOTE — Progress Notes (Signed)
[facility] MD Progress Note  08/28/2017 1:37 PM Mark [name]  MRN:  [id number] Subjective:  Pt states that he is doing pretty well. He states that he is getting frustrated on the unit because he does not like having rules and being told what to do. He is looking forward to discharging tomorrow. He is going to have a friend that he met on the unit stay with him through the weekend and likes to help friends out. He felt Ambien was effective through the night but would like to increase the dose slightly. He has been tolerating all medications well with no side effects. Increase in Gabapentin has helped with neuropathy. He continues to endorse very passive SI with no plan or intent. He states that at baseline he always has passive SI. He is future oriented and is looking forward to meeting Lanae Boast with [facility] on Friday. He denies AH, VH. Affect is brighter than on admission. He has been active and participating in groups and found them helpful.   Principal Problem: Severe recurrent major depression without psychotic features ([place]) Diagnosis:   Patient Active Problem List   Diagnosis Date Noted  . MDD (major depressive disorder) [F32.9] 08/22/2017  . Severe recurrent major depression without psychotic features ([place]) [F33.2] 08/19/2017  . Nausea & vomiting [R11.2] 08/18/2017  . Abdominal pain [R10.9] 08/18/2017  . Hyponatremia [E87.1] 08/18/2017  . DKA (diabetic ketoacidoses) ([place]) [E13.10] 11/24/2015  . Diabetic keto-acidosis ([place]) [E13.10] 10/23/2015   Total Time spent with patient: 20 minutes  Past Psychiatric History: See H&P  Past Medical History:  Past Medical History:  Diagnosis Date  . Diabetes mellitus without complication ([place])   . Hydrocephalus     Past Surgical History:  Procedure Laterality Date  . KIDNEY STONE SURGERY     Family History:  Family History  Problem Relation Age of Onset  . Breast cancer Mother    Family Psychiatric  History: See H&P Social History:  Social History    Substance and Sexual Activity  Alcohol Use No   Comment: ocasional      Social History   Substance and Sexual Activity  Drug Use No    Social History   Socioeconomic History  . Marital status: Single    Spouse name: None  . Number of children: None  . Years of education: None  . Highest education level: None  Social Needs  . Financial resource strain: None  . Food insecurity - worry: None  . Food insecurity - inability: None  . Transportation needs - medical: None  . Transportation needs - non-medical: None  Occupational History  . None  Tobacco Use  . Smoking status: Current Every Day Smoker    Packs/day: 1.00    Types: Cigarettes  . Smokeless tobacco: Never Used  Substance and Sexual Activity  . Alcohol use: No    Comment: ocasional   . Drug use: No  . Sexual activity: None  Other Topics Concern  . None  Social History Narrative  . None   Sleep: Fair  Appetite:  Fair  Current Medications: Current Facility-Administered Medications  Medication Dose Route Frequency Provider Last Rate Last Dose  . acetaminophen (TYLENOL) tablet 650 mg  650 mg Oral Q6H PRN Clapacs, [name] T, MD      . alum & mag hydroxide-simeth (MAALOX/MYLANTA) 200-200-20 MG/5ML suspension 30 mL  30 mL Oral Q4H PRN Clapacs, [name] T, MD      . bisacodyl (DULCOLAX) suppository 10 mg  10 mg Rectal Daily  PRN Clapacs, Madie Reno, MD      . diphenhydrAMINE (BENADRYL) capsule 50 mg  50 mg Oral QHS PRN Clapacs, [name] T, MD      . docusate sodium (COLACE) capsule 100 mg  100 mg Oral BID Clapacs, Madie Reno, MD   100 mg at 08/28/17 0856  . gabapentin (NEURONTIN) capsule 1,200 mg  1,200 mg Oral TID Pucilowska, Jolanta B, MD   1,200 mg at 08/28/17 1110  . hydrOXYzine (ATARAX/VISTARIL) tablet 50 mg  50 mg Oral TID PRN Marylin Crosby, MD   50 mg at 08/28/17 1110  . insulin aspart (novoLOG) injection 0-5 Units  0-5 Units Subcutaneous QHS Clapacs, Madie Reno, MD   2 Units at 08/26/17 2122  . insulin aspart (novoLOG)  injection 0-9 Units  0-9 Units Subcutaneous TID WC Clapacs, Madie Reno, MD   9 Units at 08/28/17 1152  . insulin aspart (novoLOG) injection 15 Units  15 Units Subcutaneous TID WC McNew, Tyson Babinski, MD   15 Units at 08/28/17 1153  . insulin glargine (LANTUS) injection 38 Units  38 Units Subcutaneous QHS McNew, Holly R, MD      . magnesium hydroxide (MILK OF MAGNESIA) suspension 30 mL  30 mL Oral Daily PRN McNew, Holly R, MD      . mirtazapine (REMERON) tablet 30 mg  30 mg Oral QHS McNew, Tyson Babinski, MD   30 mg at 08/27/17 2131  . nicotine (NICODERM CQ - dosed in mg/24 hours) patch 21 mg  21 mg Transdermal Daily Clapacs, Madie Reno, MD   21 mg at 08/28/17 0857  . OLANZapine (ZYPREXA) tablet 5 mg  5 mg Oral Q6H PRN McNew, Tyson Babinski, MD   5 mg at 08/28/17 1110  . ondansetron (ZOFRAN) tablet 4 mg  4 mg Oral Q6H PRN Clapacs, [name] T, MD      . traZODone (DESYREL) tablet 100 mg  100 mg Oral QHS PRN McNew, Tyson Babinski, MD      . venlafaxine XR (EFFEXOR-XR) 24 hr capsule 150 mg  150 mg Oral Q breakfast Pucilowska, Jolanta B, MD   150 mg at 08/28/17 0856  . zolpidem (AMBIEN) tablet 10 mg  10 mg Oral QHS McNew, Tyson Babinski, MD        Lab Results:  Results for orders placed or performed during the hospital encounter of 08/22/17 (from the past 48 hour(s))  Glucose, capillary     Status: Abnormal   Collection Time: 08/26/17  4:28 PM  Result Value Ref Range   Glucose-Capillary 147 (H) 65 - 99 mg/dL  Glucose, capillary     Status: Abnormal   Collection Time: 08/26/17  9:12 PM  Result Value Ref Range   Glucose-Capillary 219 (H) 65 - 99 mg/dL  Glucose, capillary     Status: Abnormal   Collection Time: 08/27/17  2:15 AM  Result Value Ref Range   Glucose-Capillary 309 (H) 65 - 99 mg/dL   Comment 1 Notify RN   Glucose, capillary     Status: Abnormal   Collection Time: 08/27/17  7:11 AM  Result Value Ref Range   Glucose-Capillary 125 (H) 65 - 99 mg/dL   Comment 1 Notify RN   Glucose, capillary     Status: None   Collection Time:  08/27/17  8:54 AM  Result Value Ref Range   Glucose-Capillary 73 65 - 99 mg/dL  Glucose, capillary     Status: Abnormal   Collection Time: 08/27/17 11:29 AM  Result Value Ref Range  Glucose-Capillary 344 (H) 65 - 99 mg/dL  Glucose, capillary     Status: Abnormal   Collection Time: 08/27/17  4:38 PM  Result Value Ref Range   Glucose-Capillary 216 (H) 65 - 99 mg/dL  Glucose, capillary     Status: Abnormal   Collection Time: 08/27/17  9:06 PM  Result Value Ref Range   Glucose-Capillary 181 (H) 65 - 99 mg/dL   Comment 1 Notify RN   Glucose, capillary     Status: Abnormal   Collection Time: 08/28/17  7:04 AM  Result Value Ref Range   Glucose-Capillary 56 (L) 65 - 99 mg/dL   Comment 1 Notify RN   Glucose, capillary     Status: Abnormal   Collection Time: 08/28/17  7:22 AM  Result Value Ref Range   Glucose-Capillary 62 (L) 65 - 99 mg/dL   Comment 1 Notify RN   Glucose, capillary     Status: Abnormal   Collection Time: 08/28/17  7:42 AM  Result Value Ref Range   Glucose-Capillary 132 (H) 65 - 99 mg/dL  Glucose, capillary     Status: Abnormal   Collection Time: 08/28/17 11:14 AM  Result Value Ref Range   Glucose-Capillary 371 (H) 65 - 99 mg/dL    Blood Alcohol level:  Lab Results  Component Value Date   ETH <10 08/18/2017   ETH <5 82/50/0370    Metabolic Disorder Labs: Lab Results  Component Value Date   HGBA1C 9.2 (H) 11/25/2015   No results found for: PROLACTIN Lab Results  Component Value Date   CHOL 340 (H) 08/23/2017   TRIG 217 (H) 08/23/2017   HDL 81 08/23/2017   CHOLHDL 4.2 08/23/2017   VLDL 43 (H) 08/23/2017   LDLCALC 216 (H) 08/23/2017   LDLCALC 73 08/14/2013    Physical Findings: AIMS:  , ,  ,  ,    CIWA:    COWS:     Musculoskeletal: Strength & Muscle Tone: within normal limits Gait & Station: normal Patient leans: N/A  Psychiatric Specialty Exam: Physical Exam  Nursing note and vitals reviewed.   Review of Systems  All other systems  reviewed and are negative.   Blood pressure 132/86, pulse 81, temperature 97.8 F (36.6 C), temperature source Oral, resp. rate 18, height 6' 2"  (1.88 m), weight 90.7 kg (200 lb), SpO2 100 %.Body mass index is 25.68 kg/m.  General Appearance: Casual  Eye Contact:  Fair  Speech:  Clear and Coherent  Volume:  Normal  Mood:  Euthymic  Affect:  Constricted  Thought Process:  Coherent  Orientation:  Full (Time, Place, and Person)  Thought Content:  Negative  Suicidal Thoughts:  Yes.  without intent/plan  Homicidal Thoughts:  No  Memory:  Immediate;   Fair  Judgement:  Fair  Insight:  Fair  Psychomotor Activity:  Normal  Concentration:  Concentration: Fair  Recall:  [employer] of Knowledge:  Fair  Language:  Fair  Akathisia:  No      Assets:  Communication Skills Desire for Improvement Social Support  ADL's:  Intact  Cognition:  WNL  Sleep:  Number of Hours: 5     Treatment Plan Summary: 31 yo male with history of depression admitted due to [place]. Mood has been improving and pt appears brighter than on admission. He has been social and participating in groups and feels he has been getting a lot out of them. He continues to report passive SI with no plan or intent and feels they  are at baseline. He is future oriented and looks forward to meeting with Lanae Boast on Friday.   MDD -Continue Effexor XR 150 mg daily -Continue Remeron 30 mg daily-this was increased last night  Anxiety -continue Gabapentin 1200 mg TID  Type I Diabetes -Decrease Lantus to 38 units qhs -Continue Novolog 15 units TID  Insomnia -Increase Ambien to 10 mg qhs  Neuropathy -Continue Gabapentin 1200 mg TID  Social/Dispo -Lives at boarding house and will return there upon discharge. He will follow up with [facility] on Friday. He will be referred tot he free pharmacy and be given 7 day supply of medications from our pharmacy. Likely discharge tomorrow.   Marylin Crosby, MD 08/28/2017, 1:37 PM

## 2017-08-28 NOTE — Progress Notes (Signed)
Recreation Therapy Notes  Date: 11.07.18  Time: 3:00pm  Location: Outside  Behavioral response: Appropriate  Group Type: Leisure  Participation level: Active  Communication: Patient was social with peers and staff.  Comments: N/A  Mark [name] LRT/CTRS        Mark [name] 08/28/2017 4:02 PM

## 2017-08-28 NOTE — Progress Notes (Signed)
Recreation Therapy Notes  Date: 11.07.18  Time: 1:00pm   Location: Craft Room  Behavioral response: Appropriate  Intervention Topic: Goals  Discussion/Intervention: Group content on today was focused on goals. Patients described what goals are and how they define goals. Individuals expressed how they go about setting goals and reaching them. The group identified how important goals are and if they make short term goals to reach long term goals. Patients described how many goals they work on at a time and what affects them not reaching their goal. Individuals described how much time they put into planning and obtaining their goals. The group participated in the intervention "My Goal Board" and made personal goal boards to help them achieve their goal. Clinical Observations/Feedback:  Patient came to group and stated his goal was to get in touch with peer support and get things straight. He expressed that he normally make a mental note of things that he needs to get done when he has goals for himself. Patient was social with peers and staff while attending group.   Filomeno Cromley LRT/CTRS         Kember Boch 08/28/2017 2:14 PM

## 2017-08-28 NOTE — Plan of Care (Signed)
Patient continues to have passive si, he states he is just hopeless. That there is no reason to go on.

## 2017-08-28 NOTE — Progress Notes (Signed)
Reported off Hypoglycemic event to on coming nurse. Will continue protocol.

## 2017-08-28 NOTE — Progress Notes (Signed)
  [facility] Discharge [facility] :  Will you be returning to the same living situation after discharge:  Yes,    At discharge, do you have transportation home?: Yes,    Do you have the ability to pay for your medications: Yes,  Referral made for Medication management clinic  Release of information consent forms completed and in the chart;  Patient's signature needed at discharge.  Patient to Follow up at: Follow-up Information    [facility] Follow up on 08/30/2017.   Why:  Unk PintoHarvey Bryant, Peer Support Services, will pick you up at 7:00am on Friday, 08/30/17, and bring you to your aftercare appointment. Contact information: [address] [place] [address] [phone]        [facility] Follow up on 08/31/2017.   Specialty:  General Practice Why:  You have an appointment at 8:40 am. Please bring proof of income and your discharge paperwork to this appointment. Contact information: [address] [place] [address] [phone]           Next level of care provider has [facility] to [facility] Link:no  Safety Planning and Suicide Prevention discussed: Yes,     Have you used any form of tobacco in the last 30 days? (Cigarettes, Smokeless Tobacco, Cigars, and/or Pipes): Yes  Has patient been referred to the Quitline?: Patient refused referral  Patient has been referred for addiction treatment: Yes  Glennon MacSara P Quantavius Humm, LCSW 08/28/2017, 3:20 PM

## 2017-08-28 NOTE — Progress Notes (Signed)
Hypoglycemic Event  CBG: 56  Treatment: 15 GM carbohydrate snack  Symptoms: Shaky  Follow-up CBG: Time:0720 CBG Result:62  Possible Reasons for Event: Unknown  Comments/MD notified: Monitored    Berkley HarveySlade I Leveda Kendrix

## 2017-08-29 LAB — GLUCOSE, CAPILLARY
Glucose-Capillary: 197 mg/dL — ABNORMAL HIGH (ref 65–99)
Glucose-Capillary: 148 mg/dL — ABNORMAL HIGH (ref 65–99)

## 2017-08-29 NOTE — Plan of Care (Signed)
Patient discharged per MD order. Patient denies any thoughts of self harm at this time.

## 2017-08-29 NOTE — Discharge Summary (Signed)
Physician Discharge Summary Note  Patient:  Mark [name] is an 31 y.o., male MRN:  [id number] DOB:  12-03-1985 Patient phone:  [phone] (home)  Patient address:   [address] [place] [address],  Total Time spent with patient: 20 minutes Plus 20 minutes of medication reconciliation, discharge planning, and discharge documentation  Date of Admission:  08/22/2017 Date of Discharge: 08/29/17  Reason for Admission:  Depression and SI.   Principal Problem: Severe recurrent major depression without psychotic features [facility]) Discharge Diagnoses: Patient Active Problem List   Diagnosis Date Noted  . MDD (major depressive disorder) [F32.9] 08/22/2017  . Severe recurrent major depression without psychotic features (HCC) [F33.2] 08/19/2017  . Nausea & vomiting [R11.2] 08/18/2017  . Abdominal pain [R10.9] 08/18/2017  . Hyponatremia [E87.1] 08/18/2017  . DKA (diabetic ketoacidoses) (HCC) [E13.10] 11/24/2015  . Diabetic keto-acidosis (HCC) [E13.10] 10/23/2015    Past Psychiatric History: See H&P  Past Medical History:  Past Medical History:  Diagnosis Date  . Diabetes mellitus without complication (HCC)   . Hydrocephalus     Past Surgical History:  Procedure Laterality Date  . KIDNEY STONE SURGERY     Family History:  Family History  Problem Relation Age of Onset  . Breast cancer Mother    Family Psychiatric  History: See H&P Social History:  Social History   Substance and Sexual Activity  Alcohol Use No   Comment: ocasional      Social History   Substance and Sexual Activity  Drug Use No    Social History   Socioeconomic History  . Marital status: Single    Spouse name: None  . Number of children: None  . Years of education: None  . Highest education level: None  Social Needs  . Financial resource strain: None  . Food insecurity - worry: None  . Food insecurity - inability: None  . Transportation needs - medical: None  . Transportation needs -  non-medical: None  Occupational History  . None  Tobacco Use  . Smoking status: Current Every Day Smoker    Packs/day: 1.00    Types: Cigarettes  . Smokeless tobacco: Never Used  Substance and Sexual Activity  . Alcohol use: No    Comment: ocasional   . Drug use: No  . Sexual activity: None  Other Topics Concern  . None  Social History Narrative  . None    Hospital Course:  Pt was started on Remeron while on the medication floor and this was titrated up to 30 mg daily. He was also started on Effexor XR and titrated to 150 mg daily. He tolerated these medications well and actually had a much brighter affect by day of discharge. He complained of chronic insomnia that really affected his mood. He was started on Ambien and this was very helpful. Pt has diabetic neuropathy and Gabapentin was titrated up to 1200 mg TID and this was also helpful for anxiety. Diabetes care coordinators were on board to help adjust his insulin and this was managed throughout his stay. On day of discharge, pt was bright, euthymic, denying SI or any thoughts of self harm. He was future oriented and stated that he looked forward to meeting Lorella Nimrod with [facility] tomorrow. We also discussed that he has a PCP appointment on Saturday which he plans to attend. He was referred to the free pharmacy and he was grateful for this. He has a friend coming to stay with him this weekend. Pt appeared much less  depressed than on admission and was much more hopeful. He was active and participated in all groups and really got a lot out of them.   The patient is at low risk of imminent suicide. Patient denied thoughts, intent, or plan for harm to self or others, expressed significant future orientation, and expressed an ability to mobilize assistance for his needs. he is presently void of any contributing psychiatric symptoms, cognitive difficulties, or substance use which would elevate his risk for lethality. Chronic risk for lethality is elevated  in light of personality traits, and learned helplessness and poor coping skills. The chronic risk is presently mitigated by his ongoing [facility] and [facility] and mobilization of support from family and friends. Chronic risk may elevate if he experiences any significant loss or worsening of symptoms, which can be managed and monitored through outpatient providers. At this time, acute risk for lethality is low and he is stable for ongoing outpatient management.   Modifiable risk factors were addressed during this hospitalization through appropriate pharmacotherapy and establishment of outpatient follow-up treatment. Some risk factors for suicide are situational (i.e. Unstable housing) or related personality pathology (i.e. Poor coping mechanisms) and thus cannot be further mitigated by continued hospitalization in this setting.   The patient demonstrates the following risk factors for suicide: Chronic risk factors for suicide include: psychiatric disorder of depression. Acute risk factors for suicide include: recent discharge from inpatient psychiatry. Protective factors for this patient include: positive therapeutic relationship, hope for the future and life satisfaction. Considering these factors, the overall suicide risk at this point appears to be low. Patient is appropriate for outpatient follow up.   Musculoskeletal: Strength & Muscle Tone: within normal limits Gait & Station: normal Patient leans: N/A  Psychiatric Specialty Exam: Physical Exam  Nursing note and vitals reviewed.   Review of Systems  All other systems reviewed and are negative.   Blood pressure 119/75, pulse 73, temperature 98.2 F (36.8 C), resp. rate 18, height 6\' 2"  (1.88 m), weight 90.7 kg (200 lb), SpO2 100 %.Body mass index is 25.68 kg/m.  General Appearance: Casual  Eye Contact:  Good  Speech:  Clear and Coherent  Volume:  Normal  Mood:  Euthymic  Affect:  Congruent  Thought Process:  Goal  Directed  Orientation:  Full (Time, Place, and Person)  Thought Content:  Negative  Suicidal Thoughts:  No  Homicidal Thoughts:  No  Memory:  Immediate;   Fair  Judgement:  Fair  Insight:  Fair  Psychomotor Activity:  Normal  Concentration:  Concentration: Fair  Recall:  [employer]Fair  Fund of Knowledge:  Fair  Language:  Fair  Akathisia:  No      Assets:  Communication Skills Desire for Improvement Resilience Social Support  ADL's:  Intact  Cognition:  WNL  Sleep:  Number of Hours: 6.15     Have you used any form of tobacco in the last 30 days? (Cigarettes, Smokeless Tobacco, Cigars, and/or Pipes): Yes  Has this patient used any form of tobacco in the last 30 days? (Cigarettes, Smokeless Tobacco, Cigars, and/or Pipes) Yes, Yes, A prescription for an FDA-approved tobacco cessation medication was offered at discharge and the patient refused  Blood Alcohol level:  Lab Results  Component Value Date   [facility] 08/18[facility]2017   ETH <5 02/24/2016    Metabolic Disorder Labs:  Lab Results  Component Value Date   HGBA1C 9.2 (H) 11/25/2015   No results found for: PROLACTIN Lab Results  Component  Value Date   CHOL 340 (H) 08/23/2017   TRIG 217 (H) 08/23/2017   HDL 81 08/23/2017   CHOLHDL 4.2 08/23/2017   VLDL 43 (H) 08/23/2017   LDLCALC 216 (H) 08/23/2017   LDLCALC 73 08/14/2013    See Psychiatric Specialty Exam and Suicide Risk Assessment completed by Attending Physician prior to discharge.  Discharge destination:  Home  Is patient on multiple antipsychotic therapies at discharge:  No   Has Patient had three or more failed trials of antipsychotic monotherapy by history:  No  Recommended Plan for Multiple Antipsychotic Therapies: NA  Discharge Instructions    Increase activity slowly   Complete by:  As directed      Allergies as of 08/29/2017      Reactions   Penicillins Anaphylaxis, Hives, Swelling   Has patient had a PCN reaction causing immediate rash,  facial/tongue/throat swelling, SOB or lightheadedness with hypotension: Yes Has patient had a PCN reaction causing severe rash involving mucus membranes or skin necrosis: No Has patient had a PCN reaction that required hospitalization Yes Has patient had a PCN reaction occurring within the last 10 years: Yes If all of the above answers are "NO", then may proceed with Cephalosporin use.   Sulfa Antibiotics Anaphylaxis, Hives   Clindamycin/lincomycin Hives      Medication List    STOP taking these medications   nicotine 21 mg/24hr patch Commonly known as:  NICODERM CQ - dosed in mg/24 hours     TAKE these medications     Indication  gabapentin 400 MG capsule Commonly known as:  NEURONTIN Take 3 capsules (1,200 mg total) 3 (three) times daily by mouth.  Indication:  Neuropathic Pain   insulin aspart 100 UNIT/ML injection Commonly known as:  novoLOG Inject 0-9 Units 3 (three) times daily with meals into the skin. Sliding Scale CBG 70 - 120: 0 units  CBG 121 - 150: 1 unit  CBG 151 - 200: 2 units  CBG 201 - 250: 3 units  CBG 251 - 300: 5 units  CBG 301 - 350: 7 units  CBG 351 - 400 9 units  Indication:  Insulin-Dependent Diabetes   insulin aspart 100 UNIT/ML injection Commonly known as:  novoLOG Inject 0-5 Units at bedtime into the skin. Bedtime Sliding Scale  CBG 70 - 120: 0 units  CBG 121 - 150: 0 units  CBG 151 - 200: 0 units  CBG 201 - 250: 2 units  CBG 251 - 300: 3 units  CBG 301 - 350: 4 units  CBG 351 - 400: 5 units  Indication:  Insulin-Dependent Diabetes   insulin aspart 100 UNIT/ML injection Commonly known as:  novoLOG Inject 15 Units 3 (three) times daily with meals into the skin. Do not take if you eat less than 50% of you meal  Indication:  Insulin-Dependent Diabetes   insulin glargine 100 UNIT/ML injection Commonly known as:  LANTUS Inject 0.38 mLs (38 Units total) at bedtime into the skin.  Indication:  Insulin-Dependent Diabetes   mirtazapine 30 MG  tablet Commonly known as:  REMERON Take 1 tablet (30 mg total) at bedtime by mouth. What changed:    medication strength  how much to take  Indication:  Major Depressive Disorder   traZODone 100 MG tablet Commonly known as:  DESYREL Take 1 tablet (100 mg total) at bedtime as needed by mouth for sleep. What changed:    when to take this  reasons to take this  Indication:  Trouble Sleeping  venlafaxine XR 150 MG 24 hr capsule Commonly known as:  EFFEXOR-XR Take 1 capsule (150 mg total) daily with breakfast by mouth.  Indication:  Major Depressive Disorder   zolpidem 10 MG tablet Commonly known as:  AMBIEN Take 1 tablet (10 mg total) at bedtime by mouth.  Indication:  Trouble Sleeping      Follow-up Information    [facility] Follow up on 08/30/2017.   Why:  Unk PintoHarvey Bryant, Peer Support Services, will pick you up at 7:00am on Friday, 08/30/17, and bring you to your aftercare appointment. Contact information: [address] [place] [address] [phone]        [facility] Follow up on 08/31/2017.   Specialty:  General Practice Why:  You have an appointment at 8:40 am. Please bring proof of income and your discharge paperwork to this appointment. Contact information: [address] [place] [address] [phone]           Follow-up recommendations:  [facility] with [facility] Signed: Haskell RilingHolly R Avett Reineck, MD 08/29/2017, 10:42 AM

## 2017-08-29 NOTE — Progress Notes (Signed)
Inpatient Diabetes Program Recommendations  AACE/ADA: New Consensus Statement on Inpatient Glycemic Control (2015)  Target Ranges:  Prepandial:   less than 140 mg/dL      Peak postprandial:   less than 180 mg/dL (1-2 hours)      Critically ill patients:  140 - 180 mg/dL   Lab Results  Component Value Date   GLUCAP 197 (H) 08/29/2017   HGBA1C 9.2 (H) 11/25/2015    Review of Glycemic Control  Results for Mark [name], [name] (MRN [id number]) as of 08/29/2017 08:53  Ref. Range 08/28/2017 16:20 08/28/2017 16:22 08/28/2017 17:10 08/28/2017 20:26 08/29/2017 07:03  Glucose-Capillary Latest Ref Range: 65 - 99 mg/dL 69 59 (L) 132110 (H) 440342 (H) 197 (H)    Diabetes history:DM 2 Home DM Meds:Lantus 50 units QHS Novolog 35 units QID  Current Insulin Orders:Lantus38units QHS Novolog Sensitive Correction Scale/ SSI (0-9 units) TID AC + HS Novolog15units TID with meals  Inpatient Diabetes Program Recommendations:  Agree with current orders. Give insulin regularly and timely for best CBG control.  Susette RacerJulie Mindel Friscia, RN, BA, MHA, CDE Diabetes Coordinator Inpatient Diabetes Program  [phone] (Team Pager) [phone] [facility] Office) 08/29/2017 8:58 AM

## 2017-08-29 NOTE — Progress Notes (Signed)
Discharge Note: Patient discharged to courtesy car transport. Medications and prescriptions provided. Medication education completed with patient and all questions from the patient have been addressed and answered. Belongings returned (cell phone, keys, clothing). AVS and follow up appointments reviewed with patient and verbalizes understanding of materials. Bus pass provided. Denies SI/HI and AVH at this time. Patient safe and ambulatory at time of discharge.

## 2017-08-29 NOTE — Plan of Care (Signed)
Patient slept for Estimated Hours of 6.15; Precautionary checks every 15 minutes for safety maintained, room free of safety hazards, patient sustains no injury or falls during this shift.  

## 2017-08-29 NOTE — Progress Notes (Signed)
Recreation Therapy Notes  INPATIENT RECREATION TR PLAN  Patient Details Name: BENICIO MANNA MRN: [id number] DOB: June 07, 1986 Today's Date: 08/29/2017  Rec Therapy Plan Is patient appropriate for Therapeutic Recreation?: Yes Treatment times per week: at least 3 Estimated Length of Stay: 5-7 days TR Treatment/Interventions: Group participation (Comment)(Appropriate participation in recreation therapy tx.)  Discharge Criteria Pt will be discharged from therapy if:: Discharged Treatment plan/goals/alternatives discussed and agreed upon by:: Patient/family  Discharge Summary Short term goals set: The Patient will attend and participate in Recreation Therapy Group Sessions x5 days.  Short term goals met: Complete Progress toward goals comments: Groups attended Which groups?: Anger management, Goal setting(Problem Solving) Reason goals not met: N/A Therapeutic equipment acquired: N/A Reason patient discharged from therapy: Discharge from hospital Pt/family agrees with progress & goals achieved: Yes Date patient discharged from therapy: 08/29/17   Darrelyn Morro 08/29/2017, 2:47 PM

## 2017-08-29 NOTE — [facility] Suicide Risk Assessment (Signed)
[facility]   Principal Problem: Severe recurrent major depression without psychotic features [facility](HCC) Discharge Diagnoses:  Patient Active Problem List   Diagnosis Date Noted  . MDD (major depressive disorder) [F32.9] 08/22/2017  . Severe recurrent major depression without psychotic features (HCC) [F33.2] 08/19/2017  . Nausea & vomiting [R11.2] 08/18/2017  . Abdominal pain [R10.9] 08/18/2017  . Hyponatremia [E87.1] 08/18/2017  . DKA (diabetic ketoacidoses) (HCC) [E13.10] 11/24/2015  . Diabetic keto-acidosis (HCC) [E13.10] 10/23/2015    Total Time spent with patient: 20 minutes  Musculoskeletal: Strength & Muscle Tone: within normal limits Gait & Station: normal Patient leans: N/A  Psychiatric Specialty Exam: ROS  Blood pressure 119/75, pulse 73, temperature 98.2 F (36.8 C), resp. rate 18, height 6\' 2"  (1.88 m), weight 90.7 kg (200 lb), SpO2 100 %.Body mass index is 25.68 kg/m.   Demographic Factors:  Male, Low socioeconomic status and Unemployed  Loss Factors: Decline in physical health  Historical Factors: Impulsivity  Risk Reduction Factors:   Positive therapeutic relationship and Positive coping skills or problem solving skills  Continued Clinical Symptoms:  Depression:   Anhedonia, apathy  Cognitive Features That Contribute To Risk:  None    Suicide Risk:  Mild:  Suicidal ideation of limited frequency, intensity, duration, and specificity.  There are no identifiable plans, no associated intent, mild dysphoria and related symptoms, good self-control (both objective and subjective assessment), few other risk factors, and identifiable protective factors, including available and accessible social support.  Follow-up Information    [facility] Follow up on 08/30/2017.   Why:  Unk PintoHarvey Bryant, Peer Support Services, will pick you up at 7:00am on Friday, 08/30/17, and bring you to your aftercare appointment. Contact  information: [address] [place] [address] [phone]        [facility] Follow up on 08/31/2017.   Specialty:  General Practice Why:  You have an appointment at 8:40 am. Please bring proof of income and your discharge paperwork to this appointment. Contact information: [address] [place] [address] [phone]           Plan Of Care/Follow-up recommendations:  Follow up With [facility]  Haskell RilingHolly R McNew, MD 08/29/2017, 10:41 AM

## 2017-08-29 NOTE — Progress Notes (Signed)
Patient ID: Mark [name], male   DOB: 12/20/1985, 31 y.o.   MRN: [id number] Irritable, angry, hostile, guarded, grumpy, poorly kept appearance, staff splitting behavior, CBG=342 "I don't want to talk to you .Marland Kitchen.Marland Kitchen.Marland Kitchen."

## 2017-09-08 ENCOUNTER — Other Ambulatory Visit: Payer: Self-pay

## 2017-09-08 ENCOUNTER — Emergency Department
Admission: EM | Admit: 2017-09-08 | Discharge: 2017-09-08 | Disposition: A | Discharge status: Home / Self Care | Payer: Self-pay | Attending: Emergency Medicine | Admitting: Emergency Medicine

## 2017-09-08 DIAGNOSIS — F17210 Nicotine dependence, cigarettes, uncomplicated: Secondary | ICD-10-CM | POA: Insufficient documentation

## 2017-09-08 DIAGNOSIS — E1142 Type 2 diabetes mellitus with diabetic polyneuropathy: Secondary | ICD-10-CM | POA: Insufficient documentation

## 2017-09-08 DIAGNOSIS — Z794 Long term (current) use of insulin: Secondary | ICD-10-CM | POA: Insufficient documentation

## 2017-09-08 DIAGNOSIS — R45851 Suicidal ideations: Secondary | ICD-10-CM | POA: Insufficient documentation

## 2017-09-08 DIAGNOSIS — Z79899 Other long term (current) drug therapy: Secondary | ICD-10-CM | POA: Insufficient documentation

## 2017-09-08 HISTORY — DX: Calculus of kidney: N20.0

## 2017-09-08 LAB — URINALYSIS, COMPLETE (UACMP) WITH MICROSCOPIC
BACTERIA UA: NONE SEEN
BILIRUBIN URINE: NEGATIVE
Glucose, UA: >=500 mg/dL — AB
KETONES UR: NEGATIVE mg/dL
Leukocytes, UA: NEGATIVE
Nitrite: NEGATIVE
PH: 6 (ref 5.0–8.0)
PROTEIN: NEGATIVE mg/dL
SQUAMOUS EPITHELIAL / LPF: NONE SEEN
Specific Gravity, Urine: 1.01 (ref 1.005–1.030)

## 2017-09-08 LAB — URINE DRUG SCREEN, QUALITATIVE (ARMC ONLY)
AMPHETAMINES, UR SCREEN: NOT DETECTED
BENZODIAZEPINE, UR SCRN: NOT DETECTED
Barbiturates, Ur Screen: NOT DETECTED
COCAINE METABOLITE, UR ~~LOC~~: NOT DETECTED
Cannabinoid 50 Ng, Ur ~~LOC~~: POSITIVE — AB
MDMA (Ecstasy)Ur Screen: NOT DETECTED
Methadone Scn, Ur: NOT DETECTED
OPIATE, UR SCREEN: NOT DETECTED
PHENCYCLIDINE (PCP) UR S: NOT DETECTED
Tricyclic, Ur Screen: NOT DETECTED

## 2017-09-08 LAB — COMPREHENSIVE METABOLIC PANEL
ALBUMIN: 4.1 g/dL (ref 3.5–5.0)
ALK PHOS: 119 U/L (ref 38–126)
ALT: 33 U/L (ref 17–63)
AST: 47 U/L — AB (ref 15–41)
Anion gap: 10 (ref 5–15)
BILIRUBIN TOTAL: 0.7 mg/dL (ref 0.3–1.2)
BUN: 8 mg/dL (ref 6–20)
CALCIUM: 8.9 mg/dL (ref 8.9–10.3)
CO2: 26 mmol/L (ref 22–32)
Chloride: 102 mmol/L (ref 101–111)
Creatinine, Ser: 0.73 mg/dL (ref 0.61–1.24)
GFR calc Af Amer: >60 mL/min (ref >60)
GFR calc non Af Amer: >60 mL/min (ref >60)
GLUCOSE: 204 mg/dL — AB (ref 65–99)
Potassium: 4.4 mmol/L (ref 3.5–5.1)
Sodium: 138 mmol/L (ref 135–145)
TOTAL PROTEIN: 7.5 g/dL (ref 6.5–8.1)

## 2017-09-08 LAB — CBC
HEMATOCRIT: 44.1 % (ref 40.0–52.0)
HEMOGLOBIN: 14.7 g/dL (ref 13.0–18.0)
MCH: 32.7 pg (ref 26.0–34.0)
MCHC: 33.4 g/dL (ref 32.0–36.0)
MCV: 98.1 fL (ref 80.0–100.0)
Platelets: 393 10*3/uL (ref 150–440)
RBC: 4.5 MIL/uL (ref 4.40–5.90)
RDW: 14.6 % — AB (ref 11.5–14.5)
WBC: 12.5 10*3/uL — AB (ref 3.8–10.6)

## 2017-09-08 LAB — ACETAMINOPHEN LEVEL: Acetaminophen (Tylenol), Serum: <10 ug/mL — ABNORMAL LOW (ref 10–30)

## 2017-09-08 LAB — GLUCOSE, CAPILLARY: GLUCOSE-CAPILLARY: 99 mg/dL (ref 65–99)

## 2017-09-08 LAB — SALICYLATE LEVEL: Salicylate Lvl: <7 mg/dL (ref 2.8–30.0)

## 2017-09-08 LAB — ETHANOL: Alcohol, Ethyl (B): <10 mg/dL (ref <10)

## 2017-09-08 MED ORDER — GABAPENTIN 400 MG PO CAPS: 1200.0000 mg | ORAL_CAPSULE | Freq: Three times a day (TID) | ORAL | 0 refills | Status: DC

## 2017-09-08 MED ORDER — ZOLPIDEM TARTRATE 10 MG PO TABS: 10.0000 mg | ORAL_TABLET | Freq: Every day | ORAL | Status: DC

## 2017-09-08 MED ORDER — INSULIN GLARGINE 100 UNIT/ML ~~LOC~~ SOLN: 38.0000 [IU] | Freq: Every day | SUBCUTANEOUS | Status: DC

## 2017-09-08 MED ORDER — TRAZODONE HCL 100 MG PO TABS: 100.0000 mg | ORAL_TABLET | Freq: Every evening | ORAL | Status: DC | PRN

## 2017-09-08 MED ORDER — GABAPENTIN 400 MG PO CAPS
1200.0000 mg | ORAL_CAPSULE | Freq: Three times a day (TID) | ORAL | Status: DC
  Administered 2017-09-08: 1200 mg via ORAL
  Filled 2017-09-08 (×2): qty 3

## 2017-09-08 MED ORDER — INSULIN ASPART 100 UNIT/ML ~~LOC~~ SOLN: 0.0000 [IU] | Freq: Every day | SUBCUTANEOUS | Status: DC

## 2017-09-08 MED ORDER — INSULIN ASPART 100 UNIT/ML ~~LOC~~ SOLN: 15.0000 [IU] | Freq: Three times a day (TID) | SUBCUTANEOUS | Status: DC

## 2017-09-08 MED ORDER — VENLAFAXINE HCL ER 150 MG PO CP24: 150.0000 mg | ORAL_CAPSULE | Freq: Every day | ORAL | Status: DC

## 2017-09-08 MED ORDER — INSULIN ASPART 100 UNIT/ML ~~LOC~~ SOLN: 0.0000 [IU] | Freq: Three times a day (TID) | SUBCUTANEOUS | Status: DC

## 2017-09-08 NOTE — ED Notes (Signed)
SOC on screen. 

## 2017-09-08 NOTE — ED Notes (Signed)
Pt was brought to first nurse desk by EMS and was told he would need to wait for another patient to finish getting registered before he could be registered. At this point pt asked this RN, "would it make me more of an emergency if I said I wanted to kill myself." This RN then asked pt if he was suicidal and pt verbalized that he wanted to kill himself. No further questions asked since pt was in lobby at this time.

## 2017-09-08 NOTE — ED Notes (Signed)
EDP at bedside  

## 2017-09-08 NOTE — ED Triage Notes (Addendum)
Pt comes into the ED via EMS from the homeless [facility] with c/o neuropathy pain, states he has been prescribed gabapentin but it is not helping with the pain. Pt states he has fallen about 5 times today due to the pain and numbness in BL leg,. Incontinent of urine due to unable to get the BR in time . Pt expresses thoughts of SI without intent.

## 2017-09-08 NOTE — ED Notes (Signed)
Pt dressed out in paper scrubs, belongings stored

## 2017-09-08 NOTE — ED Notes (Signed)
Pt states when he tried to get out of bed this AM his legs were too weak and burning and itching, hx of neuropathy, pt states SI with a vague plan, denies HI, states hx of SI attempt and when asked how pt pointed to wrist where he has scars, given diet coke, asking for warm blanket, denies any etoh or drugs

## 2017-09-08 NOTE — ED Notes (Signed)
[facility]  [phone]

## 2017-09-08 NOTE — ED Provider Notes (Addendum)
[facility] Emergency Department Provider Note  ____________________________________________  Time seen: Approximately 4:06 PM  I have reviewed the triage vital signs and the nursing notes.   HISTORY  Chief Complaint Leg Pain and Suicidal  Level 5 Caveat: Portions of the History and Physical are unable to be obtained due to patient being a poor historian   HPI Mark [name] is a 31 y.o. male with a history of diabetes, peripheral neuropathy, and depression, who complains of suicidal ideation with a plan to cut his wrists. He reports a history of suicide attempt by cutting his wrists. He has not actually harmed himself yet today. He reports is been off his medicines for the past 2 days and today he "just gave up." He states that he wants to die because he has nothing to live for. He is not willing to go into detail about any of that at this time.Symptoms are constant for the past day. No aggravating or alleviating factors that I can ascertain. Moderate to severe in intensity   Past Medical History:  Diagnosis Date  . Diabetes mellitus without complication (HCC)   . Hydrocephalus   . Kidney stones      Patient Active Problem List   Diagnosis Date Noted  . MDD (major depressive disorder) 08/22/2017  . Severe recurrent major depression without psychotic features (HCC) 08/19/2017  . Nausea & vomiting 08/18/2017  . Abdominal pain 08/18/2017  . Hyponatremia 08/18/2017  . DKA (diabetic ketoacidoses) (HCC) 11/24/2015  . Diabetic keto-acidosis (HCC) 10/23/2015     Past Surgical History:  Procedure Laterality Date  . KIDNEY STONE SURGERY       Prior to Admission medications   Medication Sig Start Date End Date Taking? Authorizing Provider  gabapentin (NEURONTIN) 400 MG capsule Take 3 capsules (1,200 mg total) 3 (three) times daily by mouth. 08/28/17   McNew, Ileene HutchinsonHolly R, MD  insulin aspart (NOVOLOG) 100 UNIT/ML injection Inject 0-9 Units 3 (three) times  daily with meals into the skin. Sliding Scale CBG 70 - 120: 0 units  CBG 121 - 150: 1 unit  CBG 151 - 200: 2 units  CBG 201 - 250: 3 units  CBG 251 - 300: 5 units  CBG 301 - 350: 7 units  CBG 351 - 400 9 units 08/28/17   McNew, Ileene HutchinsonHolly R, MD  insulin aspart (NOVOLOG) 100 UNIT/ML injection Inject 0-5 Units at bedtime into the skin. Bedtime Sliding Scale  CBG 70 - 120: 0 units  CBG 121 - 150: 0 units  CBG 151 - 200: 0 units  CBG 201 - 250: 2 units  CBG 251 - 300: 3 units  CBG 301 - 350: 4 units  CBG 351 - 400: 5 units 08/28/17   McNew, Ileene HutchinsonHolly R, MD  insulin aspart (NOVOLOG) 100 UNIT/ML injection Inject 15 Units 3 (three) times daily with meals into the skin. Do not take if you eat less than 50% of you meal 08/28/17 09/27/17  McNew, Ileene HutchinsonHolly R, MD  insulin glargine (LANTUS) 100 UNIT/ML injection Inject 0.38 mLs (38 Units total) at bedtime into the skin. 08/28/17 09/27/17  McNew, Ileene HutchinsonHolly R, MD  mirtazapine (REMERON) 30 MG tablet Take 1 tablet (30 mg total) at bedtime by mouth. 08/28/17   McNew, Ileene HutchinsonHolly R, MD  traZODone (DESYREL) 100 MG tablet Take 1 tablet (100 mg total) at bedtime as needed by mouth for sleep. 08/28/17   McNew, Ileene HutchinsonHolly R, MD  venlafaxine XR (EFFEXOR-XR) 150 MG 24 hr capsule Take 1 capsule (  150 mg total) daily with breakfast by mouth. 08/29/17   McNew, Ileene Hutchinson, MD  zolpidem (AMBIEN) 10 MG tablet Take 1 tablet (10 mg total) at bedtime by mouth. 08/28/17   McNew, Ileene Hutchinson, MD     Allergies Penicillins; Sulfa antibiotics; and Clindamycin/lincomycin   Family History  Problem Relation Age of Onset  . Breast cancer Mother     Social History Social History   Tobacco Use  . Smoking status: Current Every Day Smoker    Packs/day: 1.00    Types: Cigarettes  . Smokeless tobacco: Never Used  Substance Use Topics  . Alcohol use: No    Comment: ocasional   . Drug use: No    Review of Systems  Constitutional:   No fever or chills.  ENT:   No sore throat. No rhinorrhea. Cardiovascular:   No  chest pain or syncope. Respiratory:   No dyspnea or cough. Gastrointestinal:   Negative for abdominal pain, vomiting and diarrhea.  Musculoskeletal:   Chronic peripheral neuropathy due to diabetes  All other systems reviewed and are negative except as documented above in ROS and HPI.  ____________________________________________   PHYSICAL EXAM:  VITAL SIGNS: ED Triage Vitals  Enc Vitals Group     BP 09/08/17 1351 124/89     Pulse Rate 09/08/17 1351 87     Resp 09/08/17 1351 18     Temp 09/08/17 1351 97.9 F (36.6 C)     Temp Source 09/08/17 1351 Oral     SpO2 09/08/17 1351 100 %     Weight 09/08/17 1352 220 lb (99.8 kg)     Height 09/08/17 1352 6\' 2"  (1.88 m)     Head Circumference --      Peak Flow --      Pain Score 09/08/17 1351 7     Pain Loc --      Pain Edu? --      Excl. in GC? --     Vital signs reviewed, nursing assessments reviewed.   Constitutional:   Alert and oriented. Well appearing and in no distress. Eyes:   No scleral icterus.  EOMI. No nystagmus. No conjunctival pallor. PERRL. ENT   Head:   Normocephalic and atraumatic.   Nose:   No congestion/rhinnorhea.    Mouth/Throat:  Moist mucous membranes. Diffuse dental decay..    Neck:   No meningismus. Full ROM. Hematological/Lymphatic/Immunilogical:   No cervical lymphadenopathy. Cardiovascular:   RRR. Symmetric bilateral radial and DP pulses.  No murmurs.  Respiratory:   Normal respiratory effort without tachypnea/retractions. Breath sounds are clear and equal bilaterally. No wheezes/rales/rhonchi. Gastrointestinal:   Soft and nontender. Non distended. There is no CVA tenderness.  No rebound, rigidity, or guarding. Genitourinary:   deferred Musculoskeletal:   Normal range of motion in all extremities. No joint effusions.  No lower extremity tenderness.  No edema. Neurologic:   Normal speech and language.  Motor grossly intact. No gross focal neurologic deficits are appreciated.  Skin:     Skin is warm, dry and intact. No rash noted.  No petechiae, purpura, or bullae. No wounds  ____________________________________________    LABS (pertinent positives/negatives) (all labs ordered are listed, but only abnormal results are displayed) Labs Reviewed  COMPREHENSIVE METABOLIC PANEL - Abnormal; Notable for the following components:      Result Value   Glucose, Bld 204 (*)    AST 47 (*)    All other components within normal limits  ACETAMINOPHEN LEVEL - Abnormal; Notable for the  following components:   Acetaminophen (Tylenol), Serum <10 (*)    All other components within normal limits  CBC - Abnormal; Notable for the following components:   WBC 12.5 (*)    RDW 14.6 (*)    All other components within normal limits  URINE DRUG SCREEN, QUALITATIVE ([facility] ONLY) - Abnormal; Notable for the following components:   Cannabinoid 50 Ng, Ur [place] POSITIVE (*)    All other components within normal limits  URINALYSIS, COMPLETE (UACMP) WITH MICROSCOPIC - Abnormal; Notable for the following components:   Color, Urine YELLOW (*)    APPearance CLEAR (*)    Glucose, UA >=500 (*)    Hgb urine dipstick MODERATE (*)    All other components within normal limits  ETHANOL  SALICYLATE LEVEL   ____________________________________________   EKG    ____________________________________________    RADIOLOGY  No results found.  ____________________________________________   PROCEDURES Procedures  ____________________________________________     CLINICAL IMPRESSION / ASSESSMENT AND PLAN / ED COURSE  Pertinent labs & imaging results that were available during my care of the patient were reviewed by me and considered in my medical decision making (see chart for details).   Is well-appearing no acute distress, medically stable, presents with suicidal ideation with a plan, history of suicide attempt, off meds, high risk for suicide. Involuntary commitment initiated pending psychiatry  evaluation here at the hospital. I'll continue his medications including his gabapentin for chronic neuropathic pain, insulin, and antidepressants.       ----------------------------------------- 5:35 PM on 09/08/2017 -----------------------------------------  Discussed with Dr. Maricela BoSprague of psychiatry after his evaluation of the patient. Feels the patient is psychiatrically stable. He is medically stable with normal vital signs. Psychiatry has reverse the IVC and recommends discharge. I will refill the patient's gabapentin for control of his neuropathy. ____________________________________________   FINAL CLINICAL IMPRESSION(S) / ED DIAGNOSES    Final diagnoses:  Suicidal ideation  Type 2 diabetes mellitus with diabetic polyneuropathy, with long-term current use of insulin (HCC)      This SmartLink is deprecated. Use AVSMEDLIST instead to display the medication list for a patient.   Portions of this note were generated with dragon dictation software. Dictation errors may occur despite best attempts at proofreading.    Sharman CheekStafford, Fradel Baldonado, MD 09/08/17 1617    Sharman CheekStafford, Brytni Dray, MD 09/08/17 [phone]

## 2017-09-08 NOTE — ED Notes (Signed)
Pt resting in bed, eyes closed, resp even and unlabored

## 2017-09-09 ENCOUNTER — Encounter: Payer: Self-pay | Admitting: Emergency Medicine

## 2017-09-09 ENCOUNTER — Other Ambulatory Visit: Payer: Self-pay

## 2017-09-09 ENCOUNTER — Inpatient Hospital Stay
Admission: EM | Admit: 2017-09-09 | Discharge: 2017-09-10 | DRG: 918 | Disposition: A | Discharge status: Other Acute Inpatient Hospital | Payer: Self-pay | Attending: Internal Medicine | Admitting: Internal Medicine

## 2017-09-09 DIAGNOSIS — E162 Hypoglycemia, unspecified: Secondary | ICD-10-CM

## 2017-09-09 DIAGNOSIS — F333 Major depressive disorder, recurrent, severe with psychotic symptoms: Secondary | ICD-10-CM | POA: Diagnosis present

## 2017-09-09 DIAGNOSIS — Z882 Allergy status to sulfonamides status: Secondary | ICD-10-CM

## 2017-09-09 DIAGNOSIS — R42 Dizziness and giddiness: Secondary | ICD-10-CM

## 2017-09-09 DIAGNOSIS — R109 Unspecified abdominal pain: Secondary | ICD-10-CM | POA: Diagnosis present

## 2017-09-09 DIAGNOSIS — Z881 Allergy status to other antibiotic agents status: Secondary | ICD-10-CM

## 2017-09-09 DIAGNOSIS — Z590 Homelessness: Secondary | ICD-10-CM

## 2017-09-09 DIAGNOSIS — Z9119 Patient's noncompliance with other medical treatment and regimen: Secondary | ICD-10-CM

## 2017-09-09 DIAGNOSIS — Z794 Long term (current) use of insulin: Secondary | ICD-10-CM

## 2017-09-09 DIAGNOSIS — Z803 Family history of malignant neoplasm of breast: Secondary | ICD-10-CM

## 2017-09-09 DIAGNOSIS — F332 Major depressive disorder, recurrent severe without psychotic features: Secondary | ICD-10-CM | POA: Diagnosis present

## 2017-09-09 DIAGNOSIS — Z87442 Personal history of urinary calculi: Secondary | ICD-10-CM

## 2017-09-09 DIAGNOSIS — E160 Drug-induced hypoglycemia without coma: Secondary | ICD-10-CM | POA: Diagnosis present

## 2017-09-09 DIAGNOSIS — R45851 Suicidal ideations: Secondary | ICD-10-CM

## 2017-09-09 DIAGNOSIS — Z818 Family history of other mental and behavioral disorders: Secondary | ICD-10-CM

## 2017-09-09 DIAGNOSIS — Z79899 Other long term (current) drug therapy: Secondary | ICD-10-CM

## 2017-09-09 DIAGNOSIS — Z880 Allergy status to penicillin: Secondary | ICD-10-CM

## 2017-09-09 DIAGNOSIS — E876 Hypokalemia: Secondary | ICD-10-CM | POA: Diagnosis present

## 2017-09-09 DIAGNOSIS — F17210 Nicotine dependence, cigarettes, uncomplicated: Secondary | ICD-10-CM | POA: Diagnosis present

## 2017-09-09 DIAGNOSIS — E1110 Type 2 diabetes mellitus with ketoacidosis without coma: Secondary | ICD-10-CM | POA: Diagnosis present

## 2017-09-09 DIAGNOSIS — T383X2A Poisoning by insulin and oral hypoglycemic [antidiabetic] drugs, intentional self-harm, initial encounter: Principal | ICD-10-CM

## 2017-09-09 LAB — COMPREHENSIVE METABOLIC PANEL
ALBUMIN: 3.8 g/dL (ref 3.5–5.0)
ALT: 38 U/L (ref 17–63)
ANION GAP: 11 (ref 5–15)
AST: 62 U/L — ABNORMAL HIGH (ref 15–41)
Alkaline Phosphatase: 102 U/L (ref 38–126)
BUN: 9 mg/dL (ref 6–20)
CO2: 24 mmol/L (ref 22–32)
Calcium: 9.1 mg/dL (ref 8.9–10.3)
Chloride: 104 mmol/L (ref 101–111)
Creatinine, Ser: 0.81 mg/dL (ref 0.61–1.24)
GFR calc non Af Amer: >60 mL/min (ref >60)
GLUCOSE: 49 mg/dL — AB (ref 65–99)
POTASSIUM: 3.4 mmol/L — AB (ref 3.5–5.1)
SODIUM: 139 mmol/L (ref 135–145)
TOTAL PROTEIN: 7.1 g/dL (ref 6.5–8.1)
Total Bilirubin: 0.4 mg/dL (ref 0.3–1.2)

## 2017-09-09 LAB — CBC WITH DIFFERENTIAL/PLATELET
BASOS PCT: 0 %
Basophils Absolute: 0 10*3/uL (ref 0–0.1)
EOS ABS: 0 10*3/uL (ref 0–0.7)
EOS PCT: 0 %
HCT: 39.8 % — ABNORMAL LOW (ref 40.0–52.0)
Hemoglobin: 13.5 g/dL (ref 13.0–18.0)
Lymphocytes Relative: 14 %
Lymphs Abs: 1.7 10*3/uL (ref 1.0–3.6)
MCH: 32.8 pg (ref 26.0–34.0)
MCHC: 33.8 g/dL (ref 32.0–36.0)
MCV: 97.1 fL (ref 80.0–100.0)
MONO ABS: 0.5 10*3/uL (ref 0.2–1.0)
MONOS PCT: 4 %
Neutro Abs: 10.1 10*3/uL — ABNORMAL HIGH (ref 1.4–6.5)
Neutrophils Relative %: 82 %
Platelets: 346 10*3/uL (ref 150–440)
RBC: 4.1 MIL/uL — ABNORMAL LOW (ref 4.40–5.90)
RDW: 14.4 % (ref 11.5–14.5)
WBC: 12.3 10*3/uL — ABNORMAL HIGH (ref 3.8–10.6)

## 2017-09-09 LAB — BLOOD GAS, VENOUS
Acid-Base Excess: 4.8 mmol/L — ABNORMAL HIGH (ref 0.0–2.0)
BICARBONATE: 32 mmol/L — AB (ref 20.0–28.0)
PATIENT TEMPERATURE: 37
PCO2 VEN: 58 mmHg (ref 44.0–60.0)
PO2 VEN: UNDETERMINED mmHg (ref 32.0–45.0)
pH, Ven: 7.35 (ref 7.250–7.430)

## 2017-09-09 LAB — URINALYSIS, COMPLETE (UACMP) WITH MICROSCOPIC
BACTERIA UA: NONE SEEN
BILIRUBIN URINE: NEGATIVE
KETONES UR: NEGATIVE mg/dL
LEUKOCYTES UA: NEGATIVE
NITRITE: NEGATIVE
PROTEIN: NEGATIVE mg/dL
SQUAMOUS EPITHELIAL / LPF: NONE SEEN
Specific Gravity, Urine: 1.015 (ref 1.005–1.030)
pH: 5 (ref 5.0–8.0)

## 2017-09-09 LAB — GLUCOSE, CAPILLARY
GLUCOSE-CAPILLARY: 50 mg/dL — AB (ref 65–99)
GLUCOSE-CAPILLARY: 65 mg/dL (ref 65–99)
GLUCOSE-CAPILLARY: 51 mg/dL — AB (ref 65–99)
GLUCOSE-CAPILLARY: 218 mg/dL — AB (ref 65–99)
Glucose-Capillary: 230 mg/dL — ABNORMAL HIGH (ref 65–99)
Glucose-Capillary: 38 mg/dL — CL (ref 65–99)
Glucose-Capillary: 61 mg/dL — ABNORMAL LOW (ref 65–99)
Glucose-Capillary: 56 mg/dL — ABNORMAL LOW (ref 65–99)

## 2017-09-09 LAB — MAGNESIUM: MAGNESIUM: 1.9 mg/dL (ref 1.7–2.4)

## 2017-09-09 LAB — MRSA PCR SCREENING: MRSA BY PCR: NEGATIVE

## 2017-09-09 MED ORDER — ACETAMINOPHEN 650 MG RE SUPP: 650.0000 mg | Freq: Four times a day (QID) | RECTAL | Status: DC | PRN

## 2017-09-09 MED ORDER — POLYETHYLENE GLYCOL 3350 17 G PO PACK: 17.0000 g | PACK | Freq: Every day | ORAL | Status: DC | PRN

## 2017-09-09 MED ORDER — ACETAMINOPHEN 325 MG PO TABS: 650.0000 mg | ORAL_TABLET | Freq: Four times a day (QID) | ORAL | Status: DC | PRN

## 2017-09-09 MED ORDER — DEXTROSE 10 % IV SOLN
INTRAVENOUS | Status: DC
  Administered 2017-09-09: 100 mL via INTRAVENOUS

## 2017-09-09 MED ORDER — ONDANSETRON HCL 4 MG/2ML IJ SOLN: 4.0000 mg | Freq: Four times a day (QID) | INTRAMUSCULAR | Status: DC | PRN

## 2017-09-09 MED ORDER — VENLAFAXINE HCL ER 75 MG PO CP24
150.0000 mg | ORAL_CAPSULE | Freq: Every day | ORAL | Status: DC
  Administered 2017-09-09: 150 mg via ORAL
  Filled 2017-09-09: qty 2

## 2017-09-09 MED ORDER — ONDANSETRON HCL 4 MG PO TABS: 4.0000 mg | ORAL_TABLET | Freq: Four times a day (QID) | ORAL | Status: DC | PRN

## 2017-09-09 MED ORDER — ENOXAPARIN SODIUM 40 MG/0.4ML ~~LOC~~ SOLN
40.0000 mg | SUBCUTANEOUS | Status: DC
  Administered 2017-09-09: 40 mg via SUBCUTANEOUS
  Filled 2017-09-09: qty 0.4

## 2017-09-09 MED ORDER — KCL IN DEXTROSE-NACL 20-5-0.9 MEQ/L-%-% IV SOLN
INTRAVENOUS | Status: DC
  Administered 2017-09-09: 21:00:00 via INTRAVENOUS
  Filled 2017-09-09 (×4): qty 1000

## 2017-09-09 MED ORDER — SODIUM CHLORIDE 0.9% FLUSH
3.0000 mL | Freq: Two times a day (BID) | INTRAVENOUS | Status: DC
  Administered 2017-09-09 – 2017-09-10 (×2): 3 mL via INTRAVENOUS

## 2017-09-09 MED ORDER — DEXTROSE 50 % IV SOLN
INTRAVENOUS | Status: AC
  Filled 2017-09-09: qty 50

## 2017-09-09 MED ORDER — DEXTROSE 5 % AND 0.45 % NACL IV BOLUS
1000.0000 mL | Freq: Once | INTRAVENOUS | Status: AC
  Administered 2017-09-09: 1000 mL via INTRAVENOUS
  Filled 2017-09-09: qty 1000

## 2017-09-09 MED ORDER — MIRTAZAPINE 15 MG PO TABS
30.0000 mg | ORAL_TABLET | Freq: Every day | ORAL | Status: DC
  Administered 2017-09-09: 30 mg via ORAL
  Filled 2017-09-09: qty 2

## 2017-09-09 MED ORDER — DEXTROSE 50 % IV SOLN
1.0000 | Freq: Once | INTRAVENOUS | Status: AC
  Administered 2017-09-09: 50 mL via INTRAVENOUS
  Filled 2017-09-09: qty 50

## 2017-09-09 MED ORDER — INSULIN ASPART 100 UNIT/ML ~~LOC~~ SOLN: 0.0000 [IU] | Freq: Three times a day (TID) | SUBCUTANEOUS | Status: DC

## 2017-09-09 MED ORDER — POTASSIUM CHLORIDE 20 MEQ PO PACK
10.0000 meq | PACK | Freq: Once | ORAL | Status: AC
  Administered 2017-09-09: 10 meq via ORAL
  Filled 2017-09-09: qty 1

## 2017-09-09 MED ORDER — DEXTROSE 50 % IV SOLN
1.0000 | Freq: Once | INTRAVENOUS | Status: AC
  Administered 2017-09-09: 50 mL via INTRAVENOUS

## 2017-09-09 MED ORDER — TRAZODONE HCL 100 MG PO TABS
100.0000 mg | ORAL_TABLET | Freq: Every evening | ORAL | Status: DC | PRN
  Administered 2017-09-09: 100 mg via ORAL
  Filled 2017-09-09: qty 1

## 2017-09-09 NOTE — H&P (Signed)
[employer] [facility] [place] [facility]   PATIENT NAME: Mark [name]    MR#:  [id number]  DATE OF BIRTH:  03/04/1986  DATE OF ADMISSION:  09/09/2017  PRIMARY CARE PHYSICIAN: Patient, No Pcp Per   REQUESTING/REFERRING PHYSICIAN:   CHIEF COMPLAINT:   Chief Complaint  Patient presents with  . Dizziness    HISTORY OF PRESENT ILLNESS: Mark [name]  is a 31 y.o. male with a known history of frequent hospital admissions to the emergency room/hospital regarding DKA, admitted today for acute dizziness/lightheadedness earlier this morning after intentional overdose with insulin-patient took 50 units of Lantus, in the emergency room patient was treated appropriately with plans for possible discharge, however patient took insulin which was on his person with-unknown dose, blood sugars dropped to 36, behavioral health/psychiatry to see patient while in house-recommended inpatient admission to psychiatric facility after patient is medically stable, patient placed on D10, patient seen in emergency room, patient states that he wants to die, patient is now being admitted for acute hypoglycemia secondary to intentional suicide attempt with insulin injection, patient's medications were then taken from the patient.  PAST MEDICAL HISTORY:   Past Medical History:  Diagnosis Date  . Diabetes mellitus without complication (HCC)   . Hydrocephalus   . Kidney stones     PAST SURGICAL HISTORY:  Past Surgical History:  Procedure Laterality Date  . KIDNEY STONE SURGERY      SOCIAL HISTORY:  Social History   Tobacco Use  . Smoking status: Current Every Day Smoker    Packs/day: 1.00    Types: Cigarettes  . Smokeless tobacco: Never Used  Substance Use Topics  . Alcohol use: No    Comment: ocasional     FAMILY HISTORY:  Family History  Problem Relation Age of Onset  . Breast cancer Mother     DRUG ALLERGIES:  Allergies  Allergen Reactions  . Penicillins Anaphylaxis, Hives and  Swelling    Has patient had a PCN reaction causing immediate rash, facial/tongue/throat swelling, SOB or lightheadedness with hypotension: Yes Has patient had a PCN reaction causing severe rash involving mucus membranes or skin necrosis: No Has patient had a PCN reaction that required hospitalization Yes Has patient had a PCN reaction occurring within the last 10 years: Yes If all of the above answers are "NO", then may proceed with Cephalosporin use.  . Sulfa Antibiotics Anaphylaxis and Hives  . Clindamycin/Lincomycin Hives    REVIEW OF SYSTEMS: Poor historian, patient states that he just wants to die  CONSTITUTIONAL: No fever, fatigue or weakness.  EYES: No blurred or double vision.  EARS, NOSE, AND THROAT: No tinnitus or ear pain.  RESPIRATORY: No cough, shortness of breath, wheezing or hemoptysis.  CARDIOVASCULAR: No chest pain, orthopnea, edema.  GASTROINTESTINAL: No nausea, vomiting, diarrhea or abdominal pain.  GENITOURINARY: No dysuria, hematuria.  ENDOCRINE: No polyuria, nocturia,  HEMATOLOGY: No anemia, easy bruising or bleeding SKIN: No rash or lesion. MUSCULOSKELETAL: No joint pain or arthritis.   NEUROLOGIC: No tingling, numbness, weakness.  PSYCHIATRY: Depression, suicidal ideation  MEDICATIONS AT HOME:  Prior to Admission medications   Medication Sig Start Date End Date Taking? Authorizing Provider  gabapentin (NEURONTIN) 400 MG capsule Take 3 capsules (1,200 mg total) 3 (three) times daily by mouth. 09/08/17  Yes Sharman CheekStafford, Phillip, MD  insulin aspart (NOVOLOG) 100 UNIT/ML injection Inject 0-9 Units 3 (three) times daily with meals into the skin. Sliding Scale CBG 70 - 120: 0 units  CBG 121 - 150: 1  unit  CBG 151 - 200: 2 units  CBG 201 - 250: 3 units  CBG 251 - 300: 5 units  CBG 301 - 350: 7 units  CBG 351 - 400 9 units 08/28/17  Yes McNew, Ileene HutchinsonHolly R, MD  insulin glargine (LANTUS) 100 UNIT/ML injection Inject 0.38 mLs (38 Units total) at bedtime into the skin.  08/28/17 09/27/17 Yes McNew, Ileene HutchinsonHolly R, MD  mirtazapine (REMERON) 30 MG tablet Take 1 tablet (30 mg total) at bedtime by mouth. 08/28/17  Yes McNew, Ileene HutchinsonHolly R, MD  traZODone (DESYREL) 100 MG tablet Take 1 tablet (100 mg total) at bedtime as needed by mouth for sleep. 08/28/17  Yes McNew, Ileene HutchinsonHolly R, MD  venlafaxine XR (EFFEXOR-XR) 150 MG 24 hr capsule Take 1 capsule (150 mg total) daily with breakfast by mouth. 08/29/17  Yes McNew, Ileene HutchinsonHolly R, MD  insulin aspart (NOVOLOG) 100 UNIT/ML injection Inject 0-5 Units at bedtime into the skin. Bedtime Sliding Scale  CBG 70 - 120: 0 units  CBG 121 - 150: 0 units  CBG 151 - 200: 0 units  CBG 201 - 250: 2 units  CBG 251 - 300: 3 units  CBG 301 - 350: 4 units  CBG 351 - 400: 5 units Patient not taking: Reported on 09/09/2017 08/28/17   McNew, Ileene HutchinsonHolly R, MD  insulin aspart (NOVOLOG) 100 UNIT/ML injection Inject 15 Units 3 (three) times daily with meals into the skin. Do not take if you eat less than 50% of you meal Patient not taking: Reported on 09/09/2017 08/28/17 09/27/17  Haskell RilingMcNew, Holly R, MD  zolpidem (AMBIEN) 10 MG tablet Take 1 tablet (10 mg total) at bedtime by mouth. Patient not taking: Reported on 09/09/2017 08/28/17   McNew, Ileene HutchinsonHolly R, MD      PHYSICAL EXAMINATION:   VITAL SIGNS: Blood pressure 130/81, pulse 96, temperature 98 F (36.7 C), temperature source Oral, resp. rate 16, height 6\' 2"  (1.88 m), weight 99.8 kg (220 lb), SpO2 99 %.  GENERAL:  31 y.o.-year-old patient lying in the bed with no acute distress.  Nontoxic-appearing depression, suicidal ideation EYES: Pupils equal, round, reactive to light and accommodation. No scleral icterus. Extraocular muscles intact.  HEENT: Head atraumatic, normocephalic. Oropharynx and nasopharynx clear.  NECK:  Supple, no jugular venous distention. No thyroid enlargement, no tenderness.  LUNGS: Normal breath sounds bilaterally, no wheezing, rales,rhonchi or crepitation. No use of accessory muscles of respiration.   CARDIOVASCULAR: S1, S2 normal. No murmurs, rubs, or gallops.  ABDOMEN: Soft, nontender, nondistended. Bowel sounds present. No organomegaly or mass.  EXTREMITIES: No pedal edema, cyanosis, or clubbing.  NEUROLOGIC: Cranial nerves II through XII are intact. MAEZS. Gait not checked.  PSYCHIATRIC: Depression with suicidal ideation SKIN: No obvious rash, lesion, or ulcer.   LABORATORY PANEL:   CBC Recent Labs  Lab 09/08/17 1356 09/09/17 0732  WBC 12.5* 12.3*  HGB 14.7 13.5  HCT 44.1 39.8*  PLT 393 346  MCV 98.1 97.1  MCH 32.7 32.8  MCHC 33.4 33.8  RDW 14.6* 14.4  LYMPHSABS  --  1.7  MONOABS  --  0.5  EOSABS  --  0.0  BASOSABS  --  0.0   ------------------------------------------------------------------------------------------------------------------  Chemistries  Recent Labs  Lab 09/08/17 1356 09/09/17 0732  NA 138 139  K 4.4 3.4*  CL 102 104  CO2 26 24  GLUCOSE 204* 49*  BUN 8 9  CREATININE 0.73 0.81  CALCIUM 8.9 9.1  AST 47* 62*  ALT 33 38  ALKPHOS 119 102  BILITOT 0.7 0.4   ------------------------------------------------------------------------------------------------------------------ estimated creatinine clearance is 163.7 mL/min (by C-G formula based on SCr of 0.81 mg/dL). ------------------------------------------------------------------------------------------------------------------ No results for input(s): TSH, T4TOTAL, T3FREE, THYROIDAB in the last 72 hours.  Invalid input(s): FREET3   Coagulation profile No results for input(s): INR, PROTIME in the last 168 hours. ------------------------------------------------------------------------------------------------------------------- No results for input(s): DDIMER in the last 72 hours. -------------------------------------------------------------------------------------------------------------------  Cardiac Enzymes No results for input(s): CKMB, TROPONINI, MYOGLOBIN in the last 168  hours.  Invalid input(s): CK ------------------------------------------------------------------------------------------------------------------ Invalid input(s): POCBNP  ---------------------------------------------------------------------------------------------------------------  Urinalysis    Component Value Date/Time   COLORURINE YELLOW (A) 09/09/2017 0907   APPEARANCEUR CLEAR (A) 09/09/2017 0907   APPEARANCEUR Clear 01/20/2015 2136   LABSPEC 1.015 09/09/2017 0907   LABSPEC 1.023 01/20/2015 2136   PHURINE 5.0 09/09/2017 0907   GLUCOSEU >=500 (A) 09/09/2017 0907   GLUCOSEU >=500 01/20/2015 2136   HGBUR SMALL (A) 09/09/2017 0907   BILIRUBINUR NEGATIVE 09/09/2017 0907   BILIRUBINUR Negative 01/20/2015 2136   KETONESUR NEGATIVE 09/09/2017 0907   PROTEINUR NEGATIVE 09/09/2017 0907   NITRITE NEGATIVE 09/09/2017 0907   LEUKOCYTESUR NEGATIVE 09/09/2017 0907   LEUKOCYTESUR Negative 01/20/2015 2136     RADIOLOGY: No results found.  EKG: Orders placed or performed during the hospital encounter of 09/09/17  . ED EKG  . ED EKG  . EKG 12-Lead  . EKG 12-Lead    IMPRESSION AND PLAN: 1 acute hypoglycemia Secondary to intentional suicide with long-acting insulin/Lantus Noted patient refusing to eat Admit to stepdown unit for close observation, hold insulin regiment, will continue IV fluids with dextrose, check blood sugar every 2 hours, neuro checks per routine, fall precautions, suicide precautions, low-dose sliding scale insulin for now, and continue close medical monitoring  2 acute on chronic major depression with acute suicidal attempt, intentional with long-acting insulin Continue IVC, sitter at the bedside, suicide precautions, psychiatry input appreciated-for inpatient psychiatric admission once stable, and check urine drug screen  3 chronic diabetes mellitus type 2, insulin-dependent Noted history of DKA, noncompliance with medical monitoring Plan of care as stated  above  4 acute hypokalemia Replete with IV fluids, check magnesium level, and repeat BMP in the morning  Full code Condition guarded Prognosis fair DVT prophylaxis with Lovenox subcu Disposition to inpatient psychiatric facility in 1-2 days once medically stable    All the records are reviewed and case discussed with ED provider. Management plans discussed with the patient, family and they are in agreement.  CODE STATUS: Code Status History    Date Active Date Inactive Code Status Order ID Comments User Context   08/22/2017 16:05 08/29/2017 16:13 Full Code [id number]  Haskell Riling, MD Inpatient   08/22/2017 15:18 08/22/2017 16:05 Full Code [id number]  Audery Amel, MD Inpatient   08/22/2017 15:18 08/22/2017 15:18 Full Code [id number]  Audery Amel, MD Inpatient   08/18/2017 17:25 08/22/2017 15:15 Full Code [id number]  Marguarite Arbour, MD Inpatient   11/24/2015 13:56 11/25/2015 18:28 Full Code [id number]  Adrian Saran, MD Inpatient   10/23/2015 10:12 10/24/2015 20:27 Full Code [id number]  Alford Highland, MD ED       TOTAL TIME TAKING CARE OF THIS PATIENT: 40 minutes.    Evelena Asa Wesam Gearhart M.D on 09/09/2017   Between 7am to 6pm - Pager - [phone]  After 6pm go to [email or web address] - password [email or web address] [facility]  [facility]  Office  [phone]  CC: Primary care physician; Patient, No Pcp Per   Note: This dictation was prepared with  Dragon dictation along with smaller Company secretary. Any transcriptional errors that result from this process are unintentional.

## 2017-09-09 NOTE — ED Notes (Signed)
Pt blood sugar checked by this tech first one was 36; and second was 38

## 2017-09-09 NOTE — ED Provider Notes (Signed)
[facility] 2 Emergency Department Provider Note    None    (approximate)  I have reviewed the triage vital signs and the nursing notes.   HISTORY  Chief Complaint Dizziness    HPI Mark [name] is a 31 y.o. male with a history of insulin-dependent diabetes as well as psychiatric illness presents to the ER with chief complaint of dizziness and lightheadedness particularly worse after standing and taking a few steps.  Patient is coming from homeless shelter.  EMS was called and found that his blood sugar was 26.  Patient has had similar symptoms in the past secondary to low blood sugar.  he states that he has had "normal appetite" but has not had access to food as the supplies are limited at the homeless shelter when not there he is responsible for his own food.  Denies any chest pain.  No numbness or tingling.  Denies any shortness of breath.  No SI or HI.  No changes to his medications.  Past Medical History:  Diagnosis Date  . Diabetes mellitus without complication (HCC)   . Hydrocephalus   . Kidney stones    Family History  Problem Relation Age of Onset  . Breast cancer Mother    Past Surgical History:  Procedure Laterality Date  . KIDNEY STONE SURGERY     Patient Active Problem List   Diagnosis Date Noted  . MDD (major depressive disorder) 08/22/2017  . Severe recurrent major depression without psychotic features (HCC) 08/19/2017  . Nausea & vomiting 08/18/2017  . Abdominal pain 08/18/2017  . Hyponatremia 08/18/2017  . DKA (diabetic ketoacidoses) (HCC) 11/24/2015  . Diabetic keto-acidosis (HCC) 10/23/2015      Prior to Admission medications   Medication Sig Start Date End Date Taking? Authorizing Provider  gabapentin (NEURONTIN) 400 MG capsule Take 3 capsules (1,200 mg total) 3 (three) times daily by mouth. 09/08/17  Yes Sharman Cheek, MD  insulin aspart (NOVOLOG) 100 UNIT/ML injection Inject 0-9 Units 3 (three) times daily with  meals into the skin. Sliding Scale CBG 70 - 120: 0 units  CBG 121 - 150: 1 unit  CBG 151 - 200: 2 units  CBG 201 - 250: 3 units  CBG 251 - 300: 5 units  CBG 301 - 350: 7 units  CBG 351 - 400 9 units 08/28/17  Yes McNew, Ileene Hutchinson, MD  insulin glargine (LANTUS) 100 UNIT/ML injection Inject 0.38 mLs (38 Units total) at bedtime into the skin. 08/28/17 09/27/17 Yes McNew, Ileene Hutchinson, MD  mirtazapine (REMERON) 30 MG tablet Take 1 tablet (30 mg total) at bedtime by mouth. 08/28/17  Yes McNew, Ileene Hutchinson, MD  traZODone (DESYREL) 100 MG tablet Take 1 tablet (100 mg total) at bedtime as needed by mouth for sleep. 08/28/17  Yes McNew, Ileene Hutchinson, MD  venlafaxine XR (EFFEXOR-XR) 150 MG 24 hr capsule Take 1 capsule (150 mg total) daily with breakfast by mouth. 08/29/17  Yes McNew, Ileene Hutchinson, MD  insulin aspart (NOVOLOG) 100 UNIT/ML injection Inject 0-5 Units at bedtime into the skin. Bedtime Sliding Scale  CBG 70 - 120: 0 units  CBG 121 - 150: 0 units  CBG 151 - 200: 0 units  CBG 201 - 250: 2 units  CBG 251 - 300: 3 units  CBG 301 - 350: 4 units  CBG 351 - 400: 5 units Patient not taking: Reported on 09/09/2017 08/28/17   McNew, Ileene Hutchinson, MD  insulin aspart (NOVOLOG) 100 UNIT/ML injection Inject 15 Units  3 (three) times daily with meals into the skin. Do not take if you eat less than 50% of you meal Patient not taking: Reported on 09/09/2017 08/28/17 09/27/17  Haskell RilingMcNew, Holly R, MD  zolpidem (AMBIEN) 10 MG tablet Take 1 tablet (10 mg total) at bedtime by mouth. Patient not taking: Reported on 09/09/2017 08/28/17   Haskell RilingMcNew, Holly R, MD    Allergies Penicillins; Sulfa antibiotics; and Clindamycin/lincomycin    Social History Social History   Tobacco Use  . Smoking status: Current Every Day Smoker    Packs/day: 1.00    Types: Cigarettes  . Smokeless tobacco: Never Used  Substance Use Topics  . Alcohol use: No    Comment: ocasional   . Drug use: Yes    Types: Marijuana    Comment: "as often as I can"    Review  of Systems Patient denies headaches, rhinorrhea, blurry vision, numbness, shortness of breath, chest pain, edema, cough, abdominal pain, nausea, vomiting, diarrhea, dysuria, fevers, rashes or hallucinations unless otherwise stated above in HPI. ____________________________________________   PHYSICAL EXAM:  VITAL SIGNS: Vitals:   09/09/17 0930 09/09/17 1141  BP: 125/86 130/81  Pulse: 76 96  Resp: 13 16  Temp:  98 F (36.7 C)  SpO2: 100% 99%    Constitutional: Alert and oriented.  in no acute distress. Eyes: Conjunctivae are normal.  Head: Atraumatic. Nose: No congestion/rhinnorhea. Mouth/Throat: Mucous membranes are moist. Poor dentition  Neck: No stridor. Painless ROM.  Cardiovascular: Normal rate, regular rhythm. Grossly normal heart sounds.  Good peripheral circulation. Respiratory: Normal respiratory effort.  No retractions. Lungs CTAB. Gastrointestinal: Soft and nontender. No distention. No abdominal bruits. No CVA tenderness. Musculoskeletal: No lower extremity tenderness nor edema.  No joint effusions. Neurologic:  Normal speech and language. No gross focal neurologic deficits are appreciated. No facial droop Skin:  Skin is warm, dry and intact. No rash noted. Psychiatric: Mood and affect are normal. Speech and behavior are normal.  ____________________________________________   LABS (all labs ordered are listed, but only abnormal results are displayed)  Results for orders placed or performed during the hospital encounter of 09/09/17 (from the past 24 hour(s))  Glucose, capillary     Status: Abnormal   Collection Time: 09/09/17  7:31 AM  Result Value Ref Range   Glucose-Capillary 50 (L) 65 - 99 mg/dL  CBC with Differential/Platelet     Status: Abnormal   Collection Time: 09/09/17  7:32 AM  Result Value Ref Range   WBC 12.3 (H) 3.8 - 10.6 K/uL   RBC 4.10 (L) 4.40 - 5.90 MIL/uL   Hemoglobin 13.5 13.0 - 18.0 g/dL   HCT 16.139.8 (L) 09.640.0 - 04.552.0 %   MCV 97.1 80.0 - 100.0  fL   MCH 32.8 26.0 - 34.0 pg   MCHC 33.8 32.0 - 36.0 g/dL   RDW 40.914.4 81.111.5 - 91.414.5 %   Platelets 346 150 - 440 K/uL   Neutrophils Relative % 82 %   Neutro Abs 10.1 (H) 1.4 - 6.5 K/uL   Lymphocytes Relative 14 %   Lymphs Abs 1.7 1.0 - 3.6 K/uL   Monocytes Relative 4 %   Monocytes Absolute 0.5 0.2 - 1.0 K/uL   Eosinophils Relative 0 %   Eosinophils Absolute 0.0 0 - 0.7 K/uL   Basophils Relative 0 %   Basophils Absolute 0.0 0 - 0.1 K/uL  Comprehensive metabolic panel     Status: Abnormal   Collection Time: 09/09/17  7:32 AM  Result Value Ref Range  Sodium 139 135 - 145 mmol/L   Potassium 3.4 (L) 3.5 - 5.1 mmol/L   Chloride 104 101 - 111 mmol/L   CO2 24 22 - 32 mmol/L   Glucose, Bld 49 (L) 65 - 99 mg/dL   BUN 9 6 - 20 mg/dL   Creatinine, Ser 1.610.81 0.61 - 1.24 mg/dL   Calcium 9.1 8.9 - 09.610.3 mg/dL   Total Protein 7.1 6.5 - 8.1 g/dL   Albumin 3.8 3.5 - 5.0 g/dL   AST 62 (H) 15 - 41 U/L   ALT 38 17 - 63 U/L   Alkaline Phosphatase 102 38 - 126 U/L   Total Bilirubin 0.4 0.3 - 1.2 mg/dL   GFR calc non Af Amer >60 >60 mL/min   GFR calc Af Amer >60 >60 mL/min   Anion gap 11 5 - 15  Blood gas, venous     Status: Abnormal   Collection Time: 09/09/17  7:32 AM  Result Value Ref Range   pH, Ven 7.35 7.250 - 7.430   pCO2, Ven 58 44.0 - 60.0 mmHg   pO2, Ven UNABLE TO CALCULATE. 32.0 - 45.0 mmHg   Bicarbonate 32.0 (H) 20.0 - 28.0 mmol/L   Acid-Base Excess 4.8 (H) 0.0 - 2.0 mmol/L   Patient temperature 37.0    Collection site VEIN    Sample type VENIPUNCTURE   Urinalysis, Complete w Microscopic     Status: Abnormal   Collection Time: 09/09/17  9:07 AM  Result Value Ref Range   Color, Urine YELLOW (A) YELLOW   APPearance CLEAR (A) CLEAR   Specific Gravity, Urine 1.015 1.005 - 1.030   pH 5.0 5.0 - 8.0   Glucose, UA >=500 (A) NEGATIVE mg/dL   Hgb urine dipstick SMALL (A) NEGATIVE   Bilirubin Urine NEGATIVE NEGATIVE   Ketones, ur NEGATIVE NEGATIVE mg/dL   Protein, ur NEGATIVE NEGATIVE  mg/dL   Nitrite NEGATIVE NEGATIVE   Leukocytes, UA NEGATIVE NEGATIVE   RBC / HPF 6-30 0 - 5 RBC/hpf   WBC, UA 0-5 0 - 5 WBC/hpf   Bacteria, UA NONE SEEN NONE SEEN   Squamous Epithelial / LPF NONE SEEN NONE SEEN   Mucus PRESENT    Ca Oxalate Crys, UA PRESENT   Glucose, capillary     Status: Abnormal   Collection Time: 09/09/17  9:38 AM  Result Value Ref Range   Glucose-Capillary 230 (H) 65 - 99 mg/dL   ____________________________________________  EKG My review and personal interpretation at Time: 7:38   Indication: dizziness  Rate: 80  Rhythm: sinus Axis: normal Other: no stemi, normal intervals,  ____________________________________________ ____________________________________________   PROCEDURES  Procedure(s) performed:  Procedures    Critical Care performed: no ____________________________________________   INITIAL IMPRESSION / ASSESSMENT AND PLAN / ED COURSE  Pertinent labs & imaging results that were available during my care of the patient were reviewed by me and considered in my medical decision making (see chart for details).  DDX: hypoglycemia, dehydration, orthostasis, electrolyte abnormality, medication noncompliance, CVA, hydrocephalus, dysrhythmia  Mark [name] is a 31 y.o. who presents to the ED with lightheadedness and dizziness consistent with acute hypoglycemia as described above.  Currently the patient is well-appearing he is tolerating oral hydration.  Does appear slightly dehydrated therefore we will give IV fluids.  We will check blood work for the above differential.  This not clinically consistent with CVA worsening hydrocephalus or dysrhythmia.  The patient will be placed on continuous pulse oximetry and telemetry for monitoring.  Laboratory  evaluation will be sent to evaluate for the above complaints.     Clinical Course as of Sep 10 1503  Mon Sep 09, 2017  9562 Blood sugar is now over 200.  Patient remains in no acute distress.  Neuro exam  is nonfocal.  Likely secondary to decreased oral intake with continuation of his insulin.  There is no evidence of DKA or HHS.  He denies any SI or HI.  This does not appear consistent with an intentional overdose.  Will give referral to PCP for further titration of management of his insulin.  Have discussed with the patient and available family all diagnostics and treatments performed thus far and all questions were answered to the best of my ability. The patient demonstrates understanding and agreement with plan.   [PR]  1146 Patient now stating he wants to kill himself.  I have a low suspicion for this though patient does have a history of depression.  I do suspect this is most likely secondary to patient being homeless and refusing to go back to the homeless shelter.  Will consult psychiatry further evaluation.  [PR]  1251 Patient's blood sugar did drop and therefore I do suspect some component of intentional insulin injection.  Patient still tolerating oral hydration.  We will continue to monitor.  [PR]    Clinical Course User Index [PR] Willy Eddy, MD   ----------------------------------------- 3:05 PM on 09/09/2017 -----------------------------------------  Patient with persistent low blood sugar but mentating appropriately.  I do suspect patient has intentionally overdosed on insulin therefore will IVC.  Will give additional dose of D50.  Patient is also refusing to eat and becoming more agitated with staff.  ____________________________________________   FINAL CLINICAL IMPRESSION(S) / ED DIAGNOSES  Final diagnoses:  Hypoglycemia  Dizziness  Suicidal ideation      NEW MEDICATIONS STARTED DURING THIS VISIT:  This SmartLink is deprecated. Use AVSMEDLIST instead to display the medication list for a patient.   Note:  This document was prepared using Dragon voice recognition software and may include unintentional dictation errors.    Willy Eddy, MD 09/09/17  [phone]

## 2017-09-09 NOTE — Consult Note (Signed)
Name: Mark [name] MRN: [id number] DOB: 07/27/1986    ADMISSION DATE:  09/09/2017 CONSULTATION DATE: 09/09/2017  REFERRING MD : Dr. Katheren [name]  CHIEF COMPLAINT: Intentional Overdose   BRIEF PATIENT DESCRIPTION:  31 yo male admitted 11/19 following intentional overdose after taking unknown dose of insulin injection developing hypoglycemia   SIGNIFICANT EVENTS  11[facility]   STUDIES:  None   HISTORY OF PRESENT ILLNESS:   This is a 31 yo male with a PMH of Kidney Stones, Hydrocephalus, Multiple Suicide Attempts, Frequent hospital admissions with DKA, and Diabetes Mellitus.  He [facility] ER [facility]/19 from a homeless shelter with dizziness/lightheadedness following intentional overdose of 50 units of subcutaneous Lantus CBG's 30's, he was treated in the ER with plans for discharge.  However, unable to discharge pt per ER notes the pt had additional insulin in his possession and took an unknown dose in the ER in another suicide attempt resulting is hypoglycemia.  [facility] team for further workup and treatment PCCM consulted.   PAST MEDICAL HISTORY :   has a past medical history of Diabetes mellitus without complication (HCC), Hydrocephalus, and Kidney stones.  has a past surgical history that includes Kidney stone surgery. Prior to Admission medications   Medication Sig Start Date End Date Taking? Authorizing Provider  gabapentin (NEURONTIN) 400 MG capsule Take 3 capsules (1,200 mg total) 3 (three) times daily by mouth. 09/08/17  Yes Sharman CheekStafford, Phillip, MD  insulin aspart (NOVOLOG) 100 UNIT/ML injection Inject 0-9 Units 3 (three) times daily with meals into the skin. Sliding Scale CBG 70 - 120: 0 units  CBG 121 - 150: 1 unit  CBG 151 - 200: 2 units  CBG 201 - 250: 3 units  CBG 251 - 300: 5 units  CBG 301 - 350: 7 units  CBG 351 - 400 9 units 08/28/17  Yes Mark [name], Mark HutchinsonHolly R, MD  insulin glargine (LANTUS) 100  UNIT/ML injection Inject 0.38 mLs (38 Units total) at bedtime into the skin. 08/28/17 09/27/17 Yes Mark [name], Mark HutchinsonHolly R, MD  mirtazapine (REMERON) 30 MG tablet Take 1 tablet (30 mg total) at bedtime by mouth. 08/28/17  Yes Mark [name], Mark HutchinsonHolly R, MD  traZODone (DESYREL) 100 MG tablet Take 1 tablet (100 mg total) at bedtime as needed by mouth for sleep. 08/28/17  Yes Mark [name], Mark HutchinsonHolly R, MD  venlafaxine XR (EFFEXOR-XR) 150 MG 24 hr capsule Take 1 capsule (150 mg total) daily with breakfast by mouth. 08/29/17  Yes Mark [name], Mark HutchinsonHolly R, MD  insulin aspart (NOVOLOG) 100 UNIT/ML injection Inject 0-5 Units at bedtime into the skin. Bedtime Sliding Scale  CBG 70 - 120: 0 units  CBG 121 - 150: 0 units  CBG 151 - 200: 0 units  CBG 201 - 250: 2 units  CBG 251 - 300: 3 units  CBG 301 - 350: 4 units  CBG 351 - 400: 5 units Patient not taking: Reported on 09/09/2017 08/28/17   Mark [name], Mark HutchinsonHolly R, MD  insulin aspart (NOVOLOG) 100 UNIT/ML injection Inject 15 Units 3 (three) times daily with meals into the skin. Do not take if you eat less than 50% of you meal Patient not taking: Reported on 09/09/2017 08/28/17 09/27/17  Mark [name], Mark R, MD  zolpidem (AMBIEN) 10 MG tablet Take 1 tablet (10 mg total) at bedtime by mouth. Patient not taking: Reported on 09/09/2017 08/28/17   Mark [name], Mark R, MD   Allergies  Allergen Reactions  . Penicillins Anaphylaxis,  Hives and Swelling    Has patient had a PCN reaction causing immediate rash, facial/tongue/throat swelling, SOB or lightheadedness with hypotension: Yes Has patient had a PCN reaction causing severe rash involving mucus membranes or skin necrosis: No Has patient had a PCN reaction that required hospitalization Yes Has patient had a PCN reaction occurring within the last 10 years: Yes If all of the above answers are "NO", then may proceed with Cephalosporin use.  . Sulfa Antibiotics Anaphylaxis and Hives  . Clindamycin/Lincomycin Hives    FAMILY HISTORY:  family history includes Breast cancer  in his mother. SOCIAL HISTORY:  reports that he has been smoking cigarettes.  He has been smoking about 1.00 pack per day. he has never used smokeless tobacco. He reports that he uses drugs. Drug: Marijuana. He reports that he does not drink alcohol.  REVIEW OF SYSTEMS: Positives in BOLD  Constitutional: Negative for fever, chills, weight loss, malaise/fatigue and diaphoresis.  Psych: suicidal ideation and attempt  HENT: Negative for hearing loss, ear pain, nosebleeds, congestion, sore throat, neck pain, tinnitus and ear discharge.   Eyes: Negative for blurred vision, double vision, photophobia, pain, discharge and redness.  Respiratory: Negative for cough, hemoptysis, sputum production, shortness of breath, wheezing and stridor.   Cardiovascular: Negative for chest pain, palpitations, orthopnea, claudication, leg swelling and PND.  Gastrointestinal: Negative for heartburn, nausea, vomiting, abdominal pain, diarrhea, constipation, blood in stool and melena.  Genitourinary: Negative for dysuria, urgency, frequency, hematuria and flank pain.  Musculoskeletal: Negative for myalgias, back pain, joint pain and falls.  Skin: Negative for itching and rash.  Neurological: Negative for dizziness, tingling, tremors, sensory change, speech change, focal weakness, seizures, loss of consciousness, weakness and headaches.  Endo/Heme/Allergies: Negative for environmental allergies and polydipsia. Does not bruise/bleed easily.  SUBJECTIVE:  Pt cussing stating "I was not trying to kill myself yelling get out of my room"   VITAL SIGNS: Temp:  [98 F (36.7 C)-98.3 F (36.8 C)] 98.3 F (36.8 C) (11/19 1939) Pulse Rate:  [76-96] 83 (11/19 1939) Resp:  [13-17] 17 (11/19 1939) BP: (118-136)/(76-92) 118/76 (11/19 1939) SpO2:  [99 %-100 %] 99 % (11/19 1939) Weight:  [99.8 kg (220 lb)] 99.8 kg (220 lb) (11/19 0734)  PHYSICAL EXAMINATION: General: well developed, well nourished, NAD  Neuro: alert and  oriented, follows commands  HEENT: supple, no JVD  Cardiovascular: nsr, s1s2, no M/[name]/G Lungs: clear throughout, even, non labored  Abdomen: +BS x4, soft, non tender, non distended  Musculoskeletal: normal bulk and tone, no edema Skin: intact no rashes or lesions   Recent Labs  Lab 09/08/17 1356 09/09/17 0732  NA 138 139  K 4.4 3.4*  CL 102 104  CO2 26 24  BUN 8 9  CREATININE 0.73 0.81  GLUCOSE 204* 49*   Recent Labs  Lab 09/08/17 1356 09/09/17 0732  HGB 14.7 13.5  HCT 44.1 39.8*  WBC 12.5* 12.3*  PLT 393 346   No results found.  ASSESSMENT / PLAN: Intentional Insulin Overdose  Hypoglycemia secondary to insulin overdose  Hypokalemia  Hx: Depression and Diabetes Mellitus  P: D5W1/2NS with 20 meq K+ @75  ml/hr CBG's q2hr for now Follow hypoglycemic protocol  Encourage po intake  Hold outpatient insulin for now  Suicide and Fall precautions Sitter at bedside at all times Psychiatry consulted appreciate input Urine drug screen pending  Continuous telemetry monitoring  Trend BMP Replace electrolytes as indicated Monitor UOP   Sonda Rumbleana Blakeney, AGNP  Pulmonary/Critical Care Pager [phone] (please enter 7  digits) PCCM Consult Pager [phone] (please enter 7 digits)

## 2017-09-09 NOTE — Discharge Instructions (Signed)
Please follow-up with PCP return for any additional questions or concerns.  On nights you not able to get as much food I would recommend you decrease your nightly Lantus injection to 30 units to keep yourself from bottoming out and low sugars.

## 2017-09-09 NOTE — ED Triage Notes (Signed)
Pt from [facility]. EMS called out b/c pt dizzy. CBG was 26 before EMS arrived, pt given coffee and cbg 76 upon their arrival. Pt c/o dizziness. Pt arrives with skin abrasion to left arm from a fall last night. Pt had urinated on himself before arriva. Pt alert & oriented with NAD noted.

## 2017-09-09 NOTE — Progress Notes (Signed)
Bag of belongings gone through with Cassell ClementAmber Collins, RN. 3 bottles of gabapentin removed and sent to pharmacy for storage. No other medications in with belongings, only insulin syringes.

## 2017-09-09 NOTE — ED Notes (Signed)
ED Is the patient under IVC or is there intent for IVC:  No - voluntary   Is the patient medically cleared: Yes.   Is there vacancy in the ED BHU: Yes.   Is the population mix appropriate for patient: Yes.   Is the patient awaiting placement in inpatient or outpatient setting:    Has the patient had a psychiatric consult: Yes.   - [facility] is getting ready to evaluate  Survey of unit performed for contraband, proper placement and condition of furniture, tampering with fixtures in bathroom, shower, and each patient room: Yes.  ; Findings:  APPEARANCE/BEHAVIOR Calm and cooperative NEURO ASSESSMENT Orientation: oriented x3  Denies pain Hallucinations: No.None noted (Hallucinations) Speech: Normal Gait: normal RESPIRATORY ASSESSMENT Even  Unlabored respirations  CARDIOVASCULAR ASSESSMENT Pulses equal   regular rate  Skin warm and dry   GASTROINTESTINAL ASSESSMENT no GI complaint EXTREMITIES Full ROM  PLAN OF CARE Provide calm/safe environment. Vital signs assessed twice daily. ED BHU Assessment once each 12-hour shift. Collaborate with TTS when available or as condition indicates. Assure the ED provider has rounded once each shift. Provide and encourage hygiene. Provide redirection as needed. Assess for escalating behavior; address immediately and inform ED provider.  Assess family dynamic and appropriateness for visitation as needed: Yes.  ; If necessary, describe findings:  Educate the patient/family about BHU procedures/visitation: Yes.  ; If necessary, describe findings:

## 2017-09-09 NOTE — ED Notes (Signed)
Pt yelled and cursed " Im not F*cking entertaining you, I dont care how f*cking prim and proper you are aint nobody f*cking talking to you" at this tech while pt was talking to the ods officer; amy, rn amd greg, rn came to talk to pt to inform to please stop cursing and yelling. This tech moved in front room 25 to stay out of pts sight.

## 2017-09-09 NOTE — ED Notes (Signed)
Meal tray placed on pt's bed, pt advised of same. Pt states he doesn't want it, he just wants to die.

## 2017-09-09 NOTE — ED Notes (Signed)
BEHAVIORAL HEALTH ROUNDING Patient sleeping: No. Patient alert and oriented: yes Behavior appropriate: Yes.  ; If no, describe:  Nutrition and fluids offered: yes Toileting and hygiene offered: Yes  Sitter present: q15 minute observations and security  monitoring Law enforcement present: Yes  ODS  

## 2017-09-09 NOTE — ED Notes (Signed)
Psychiatrist is currently at the bedside

## 2017-09-09 NOTE — ED Notes (Signed)
He can be heard stating to the doctor  "it don't matter if you make me leave today, tomorrow or in days -* it is not going to change anything - I will be killing myself the day you make me leave."

## 2017-09-09 NOTE — ED Notes (Signed)
When reviewing D/C instructions pt became angry asking repeatedly what he is supposed to do with his stuff when he gets on the bus. Pt then voices suicidal ideation with a plan. Pt states "I just want to fucking die, I don't care about no one or nothing." When asked about a plan pt states "when I get back to the homeless shelter I'm going to find the sharpest thing I can and just start stabbing the fuck out of my stomach until I die". Pt voices repeatedly he does not want to go back to the homeless shelter at this time. MD aware, [facility], Amy, RN made aware, Tammy SoursGreg, Charge RN made aware. Pt placed into purple scrubs by [name], RN and this RN. Pt's clothes, cell phone, wallet, and a big trash bag that patient came in with last night taken.

## 2017-09-09 NOTE — ED Notes (Addendum)

## 2017-09-09 NOTE — ED Provider Notes (Signed)
The patient has been in our emergency department for 10 hours and has persistently low blood sugars.  He gave himself 50 units of Lantus prior to arrival in an unknown dose while here in our emergency department.  At this point he is refusing to eat and his blood sugars remain dangerously low.  He is involuntarily committed.  He requires D10 drip to keep his blood sugars up as well as inpatient admission for monitoring given the long duration of action of Lantus.  CRITICAL CARE Performed by: Merrily BrittleNeil Deina Lipsey   Total critical care time: 30 minutes  Critical care time was exclusive of separately billable procedures and treating other patients.  Critical care was necessary to treat or prevent imminent or life-threatening deterioration.  Critical care was time spent personally by me on the following activities: development of treatment plan with patient and/or surrogate as well as nursing, discussions with consultants, evaluation of patient's response to treatment, examination of patient, obtaining history from patient or surrogate, ordering and performing treatments and interventions, ordering and review of laboratory studies, ordering and review of radiographic studies, pulse oximetry and re-evaluation of patient's condition.    Merrily Brittleifenbark, Bruce Churilla, MD 09/09/17 [phone]

## 2017-09-09 NOTE — ED Notes (Signed)
Pt given a bus pass to go back to the homeless shelter. Pt also given blue paper scrubs and mesh underwear due patient reports of his clothes being wet.

## 2017-09-09 NOTE — ED Notes (Addendum)
Pt had ods officer throw his food tray away; this tech gave pt another food tray and explained the importance of eating when blood sugar is dropping; pt stated " wouldn't that defeat the purpose of being here at the hospital."

## 2017-09-09 NOTE — Progress Notes (Addendum)
Inpatient Diabetes Program Recommendations  AACE/ADA: New Consensus Statement on Inpatient Glycemic Control (2015)  Target Ranges:  Prepandial:   less than 140 mg/dL      Peak postprandial:   less than 180 mg/dL (1-2 hours)      Critically ill patients:  140 - 180 mg/dL   Lab Results  Component Value Date   GLUCAP 230 (H) 09/09/2017   HGBA1C 9.2 (H) 11/25/2015    Review of Glycemic ControlResults for Mark [name], Mark [name] (MRN [id number]) as of 09/09/2017 10:24  Ref. Range 09/08/2017 16:10 09/09/2017 07:31 09/09/2017 09:38  Glucose-Capillary Latest Ref Range: 65 - 99 mg/dL 99 50 (L) 409230 (H)    Diabetes history:  Outpatient Diabetes medications: Lantus 38 units [name] HS, Novolog 15 units tid with meals Current orders for Inpatient glycemic control:  None Inpatient Diabetes Program Recommendations:    Referral received due to low blood sugars.  It appears that patient was on basal insulin prior to admit which likely has contributed to low blood sugar this morning.  Spoke with RN.  She states that patient is being d/c'd back to homeless shelter today.  May consider reducing Lantus to 30 units daily. Based on medication reconciliation, patient was not taking Novolog prior to admit.    Patient will need close follow-up with PCP and close monitoring of blood sugars to prevent hypoglycemia.   Will follow.  Thanks, Beryl MeagerJenny Jaylani Mcguinn, RN, BC-ADM Inpatient Diabetes Coordinator Pager [phone] (8a-5p)

## 2017-09-09 NOTE — ED Notes (Signed)
Attempted to call report - Mark MuirJamie - charge CCU states that we must give them some time before she can approve him -   He has ambulated to and from the Br with a steady gait  NAD assessed

## 2017-09-09 NOTE — ED Notes (Signed)
Pt given breakfast tray

## 2017-09-09 NOTE — ED Notes (Signed)
Pt verbalizing to me  "its okay of that psychiatrist comes to talk to me but when he makes me leave then I am going to stab myself with a knife - this will be all y'alls fault - you better not send me out or I will kill myself and then it will be on y'all."

## 2017-09-09 NOTE — ED Notes (Signed)
Pt given soda to due to cbg 50

## 2017-09-09 NOTE — ED Notes (Signed)
Pt is refusing to eat food tray this tech gave him; pt has laid the food tray on the floor beside bed; will continue to monitor pt and check blood glucose at 2 pm

## 2017-09-09 NOTE — Consult Note (Signed)
Mark [name]   Reason for [name]: [name] for 31 year old man with a history of diabetes and depression Referring Physician: Quentin Cornwall Patient Identification: Mark [name] MRN:  [id number] Principal Diagnosis: Severe recurrent major depression without psychotic features [facility]) Diagnosis:   Patient Active Problem List   Diagnosis Date Noted  . MDD (major depressive disorder) [F32.9] 08/22/2017  . Severe recurrent major depression without psychotic features ([place]) [F33.2] 08/19/2017  . Nausea & vomiting [R11.2] 08/18/2017  . Abdominal pain [R10.9] 08/18/2017  . Hyponatremia [E87.1] 08/18/2017  . DKA (diabetic ketoacidoses) ([place]) [E13.10] 11/24/2015  . Diabetic keto-acidosis ([place]) [E13.10] 10/23/2015    Total Time spent with patient: 1 hour  Subjective:   Mark [name] is a 31 y.o. male patient admitted with "I am going to kill myself".  HPI: 31 year old man presented to the emergency room initially with physical complaints but when discharge was discussed told the emergency room physician that he intended to kill himself.  On interview with me the patient says that his life is awful, he has lost everything, he has absolutely nothing to live for, and that he absolutely intends to kill himself.  He states that if he leaves the emergency room he will go find the first sharp object he can find and stab himself in the abdomen with it.  He says he will do this because he has lost everything.  Since his last discharge here he no longer is living in his boardinghouse.  He has been staying at the shelter.  He has his medication but it sounds like he is been haphazard taking care of his illness.  Denies substance abuse.  Sleep is poor.  Mood is irritable and angry.  Does not report delusions or hallucinations.  No homicidal ideation.  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  He used to support himself by working but currently does not have a  job.  He is convinced that he will never be able to keep a job again.  Not staying anywhere except the shelter right now.  Very few supports.  Medical history: Patient has diabetes which she does a chronically poor job taking care of as a result has had multiple episodes of diabetic ketoacidosis  Substance abuse history: Denies regular alcohol or drug abuse  Past Psychiatric History: Patient has a past history of depression and a past history of suicide attempts.  Past history of hospitalization and treatment for depression.  Just discharged from the hospital about a week ago.  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  Risk to Self: Is patient at risk for suicide?: No Risk to Others:   Prior Inpatient Therapy:   Prior Outpatient Therapy:    Past Medical History:  Past Medical History:  Diagnosis Date  . Diabetes mellitus without complication ([place])   . Hydrocephalus   . Kidney stones     Past Surgical History:  Procedure Laterality Date  . KIDNEY STONE SURGERY     Family History:  Family History  Problem Relation Age of Onset  . Breast cancer Mother    Family Psychiatric  History: Positive for depression Social History:  Social History   Substance and Sexual Activity  Alcohol Use No   Comment: ocasional      Social History   Substance and Sexual Activity  Drug Use Yes  . Types: Marijuana   Comment: "as often as I can"    Social History   Socioeconomic History  .  Marital status: Single    Spouse name: None  . Number of children: None  . Years of education: None  . Highest education level: None  Social Needs  . Financial resource strain: None  . Food insecurity - worry: None  . Food insecurity - inability: None  . Transportation needs - medical: None  . Transportation needs - non-medical: None  Occupational History  . None  Tobacco Use  . Smoking status: Current Every Day Smoker    Packs/day: 1.00    Types: Cigarettes  .  Smokeless tobacco: Never Used  Substance and Sexual Activity  . Alcohol use: No    Comment: ocasional   . Drug use: Yes    Types: Marijuana    Comment: "as often as I can"  . Sexual activity: None  Other Topics Concern  . None  Social History Narrative  . None   Additional Social History:    Allergies:   Allergies  Allergen Reactions  . Penicillins Anaphylaxis, Hives and Swelling    Has patient had a PCN reaction causing immediate rash, facial/tongue/throat swelling, SOB or lightheadedness with hypotension: Yes Has patient had a PCN reaction causing severe rash involving mucus membranes or skin necrosis: No Has patient had a PCN reaction that required hospitalization Yes Has patient had a PCN reaction occurring within the last 10 years: Yes If all of the above answers are "NO", then may proceed with Cephalosporin use.  . Sulfa Antibiotics Anaphylaxis and Hives  . Clindamycin/Lincomycin Hives    Labs:  Results for orders placed or performed during the hospital encounter of 09/09/17 (from the past 48 hour(s))  Glucose, capillary     Status: Abnormal   Collection Time: 09/09/17  7:31 AM  Result Value Ref Range   Glucose-Capillary 50 (L) 65 - 99 mg/dL  CBC with Differential/Platelet     Status: Abnormal   Collection Time: 09/09/17  7:32 AM  Result Value Ref Range   WBC 12.3 (H) 3.8 - 10.6 K/uL   RBC 4.10 (L) 4.40 - 5.90 MIL/uL   Hemoglobin 13.5 13.0 - 18.0 g/dL   HCT 39.8 (L) 40.0 - 52.0 %   MCV 97.1 80.0 - 100.0 fL   MCH 32.8 26.0 - 34.0 pg   MCHC 33.8 32.0 - 36.0 g/dL   RDW 14.4 11.5 - 14.5 %   Platelets 346 150 - 440 K/uL   Neutrophils Relative % 82 %   Neutro Abs 10.1 (H) 1.4 - 6.5 K/uL   Lymphocytes Relative 14 %   Lymphs Abs 1.7 1.0 - 3.6 K/uL   Monocytes Relative 4 %   Monocytes Absolute 0.5 0.2 - 1.0 K/uL   Eosinophils Relative 0 %   Eosinophils Absolute 0.0 0 - 0.7 K/uL   Basophils Relative 0 %   Basophils Absolute 0.0 0 - 0.1 K/uL  Comprehensive  metabolic panel     Status: Abnormal   Collection Time: 09/09/17  7:32 AM  Result Value Ref Range   Sodium 139 135 - 145 mmol/L   Potassium 3.4 (L) 3.5 - 5.1 mmol/L   Chloride 104 101 - 111 mmol/L   CO2 24 22 - 32 mmol/L   Glucose, Bld 49 (L) 65 - 99 mg/dL   BUN 9 6 - 20 mg/dL   Creatinine, Ser 0.81 0.61 - 1.24 mg/dL   Calcium 9.1 8.9 - 10.3 mg/dL   Total Protein 7.1 6.5 - 8.1 g/dL   Albumin 3.8 3.5 - 5.0 g/dL   AST 62 (H)  15 - 41 U/L   ALT 38 17 - 63 U/L   Alkaline Phosphatase 102 38 - 126 U/L   Total Bilirubin 0.4 0.3 - 1.2 mg/dL   GFR calc non Af Amer >60 >60 mL/min   GFR calc Af Amer >60 >60 mL/min    Comment: (NOTE) The eGFR has been calculated using the CKD EPI equation. This calculation has not been validated in all clinical situations. eGFR's persistently <60 mL/min signify possible Chronic Kidney Disease.    Anion gap 11 5 - 15  Blood gas, venous     Status: Abnormal   Collection Time: 09/09/17  7:32 AM  Result Value Ref Range   pH, Ven 7.35 7.250 - 7.430   pCO2, Ven 58 44.0 - 60.0 mmHg   pO2, Ven UNABLE TO CALCULATE. 32.0 - 45.0 mmHg   Bicarbonate 32.0 (H) 20.0 - 28.0 mmol/L   Acid-Base Excess 4.8 (H) 0.0 - 2.0 mmol/L   Patient temperature 37.0    Collection site VEIN    Sample type VENIPUNCTURE   Urinalysis, Complete w Microscopic     Status: Abnormal   Collection Time: 09/09/17  9:07 AM  Result Value Ref Range   Color, Urine YELLOW (A) YELLOW   APPearance CLEAR (A) CLEAR   Specific Gravity, Urine 1.015 1.005 - 1.030   pH 5.0 5.0 - 8.0   Glucose, UA >=500 (A) NEGATIVE mg/dL   Hgb urine dipstick SMALL (A) NEGATIVE   Bilirubin Urine NEGATIVE NEGATIVE   Ketones, ur NEGATIVE NEGATIVE mg/dL   Protein, ur NEGATIVE NEGATIVE mg/dL   Nitrite NEGATIVE NEGATIVE   Leukocytes, UA NEGATIVE NEGATIVE   RBC / HPF 6-30 0 - 5 RBC/hpf   WBC, UA 0-5 0 - 5 WBC/hpf   Bacteria, UA NONE SEEN NONE SEEN   Squamous Epithelial / LPF NONE SEEN NONE SEEN   Mucus PRESENT    Ca  Oxalate Crys, UA PRESENT   Glucose, capillary     Status: Abnormal   Collection Time: 09/09/17  9:38 AM  Result Value Ref Range   Glucose-Capillary 230 (H) 65 - 99 mg/dL  Glucose, capillary     Status: None   Collection Time: 09/09/17 12:47 PM  Result Value Ref Range   Glucose-Capillary 65 65 - 99 mg/dL   Comment 1 Notify RN   Glucose, capillary     Status: Abnormal   Collection Time: 09/09/17  2:03 PM  Result Value Ref Range   Glucose-Capillary 36 (LL) 65 - 99 mg/dL   Comment 1 Notify RN    Comment 2 Call MD NNP PA CNM   Glucose, capillary     Status: Abnormal   Collection Time: 09/09/17  2:04 PM  Result Value Ref Range   Glucose-Capillary 38 (LL) 65 - 99 mg/dL   Comment 1 Notify RN    Comment 2 Document in Chart    Comment 3 Repeat Test     No current facility-administered medications for this encounter.    Current Outpatient Medications  Medication Sig Dispense Refill  . gabapentin (NEURONTIN) 400 MG capsule Take 3 capsules (1,200 mg total) 3 (three) times daily by mouth. 270 capsule 0  . insulin aspart (NOVOLOG) 100 UNIT/ML injection Inject 0-9 Units 3 (three) times daily with meals into the skin. Sliding Scale CBG 70 - 120: 0 units  CBG 121 - 150: 1 unit  CBG 151 - 200: 2 units  CBG 201 - 250: 3 units  CBG 251 - 300: 5 units  CBG  301 - 350: 7 units  CBG 351 - 400 9 units 10 mL 0  . insulin glargine (LANTUS) 100 UNIT/ML injection Inject 0.38 mLs (38 Units total) at bedtime into the skin. 11.4 mL 0  . mirtazapine (REMERON) 30 MG tablet Take 1 tablet (30 mg total) at bedtime by mouth. 30 tablet 2  . traZODone (DESYREL) 100 MG tablet Take 1 tablet (100 mg total) at bedtime as needed by mouth for sleep. 30 tablet 2  . venlafaxine XR (EFFEXOR-XR) 150 MG 24 hr capsule Take 1 capsule (150 mg total) daily with breakfast by mouth. 30 capsule 2  . insulin aspart (NOVOLOG) 100 UNIT/ML injection Inject 0-5 Units at bedtime into the skin. Bedtime Sliding Scale  CBG 70 - 120: 0  units  CBG 121 - 150: 0 units  CBG 151 - 200: 0 units  CBG 201 - 250: 2 units  CBG 251 - 300: 3 units  CBG 301 - 350: 4 units  CBG 351 - 400: 5 units (Patient not taking: Reported on 09/09/2017) 10 mL 0  . insulin aspart (NOVOLOG) 100 UNIT/ML injection Inject 15 Units 3 (three) times daily with meals into the skin. Do not take if you eat less than 50% of you meal (Patient not taking: Reported on 09/09/2017) 13.5 mL 0  . zolpidem (AMBIEN) 10 MG tablet Take 1 tablet (10 mg total) at bedtime by mouth. (Patient not taking: Reported on 09/09/2017) 30 tablet 1    Musculoskeletal: Strength & Muscle Tone: within normal limits Gait & Station: normal Patient leans: N/A  Psychiatric Specialty Exam: Physical Exam  Nursing note and vitals reviewed. Constitutional: He appears well-developed and well-nourished.  HENT:  Head: Normocephalic and atraumatic.  Eyes: Conjunctivae are normal. Pupils are equal, round, and reactive to light.  Neck: Normal range of motion.  Cardiovascular: Normal heart sounds.  Respiratory: Effort normal and breath sounds normal.  GI: Soft.  Musculoskeletal: Normal range of motion.  Neurological: He is alert.  Skin: Skin is warm and dry.  Psychiatric: His speech is normal. His affect is angry. He is agitated. He is not aggressive and not combative. He expresses impulsivity and inappropriate judgment. He exhibits a depressed mood. He expresses suicidal ideation. He expresses suicidal plans. He exhibits abnormal recent memory.    Review of Systems  Constitutional: Negative.   HENT: Negative.   Eyes: Negative.   Respiratory: Negative.   Cardiovascular: Negative.   Gastrointestinal: Positive for abdominal pain.  Musculoskeletal: Negative.   Skin: Negative.   Neurological: Negative.   Psychiatric/Behavioral: Positive for depression and suicidal ideas. Negative for hallucinations, memory loss and substance abuse. The patient is nervous/anxious. The patient does not have  insomnia.     Blood pressure 130/81, pulse 96, temperature 98 F (36.7 C), temperature source Oral, resp. rate 16, height 6' 2"  (1.88 m), weight 99.8 kg (220 lb), SpO2 99 %.Body mass index is 28.25 kg/m.  General Appearance: Disheveled  Eye Contact:  Good  Speech:  Clear and Coherent  Volume:  Increased  Mood:  Depressed and Irritable  Affect:  Depressed  Thought Process:  Goal Directed  Orientation:  Full (Time, Place, and Person)  Thought Content:  Illogical, Rumination and Tangential  Suicidal Thoughts:  Yes.  with intent/plan  Homicidal Thoughts:  No  Memory:  Immediate;   Fair Recent;   Fair Remote;   Fair  Judgement:  Poor  Insight:  Shallow  Psychomotor Activity:  Normal  Concentration:  Concentration: Fair  Recall:  Fair  Fund of Knowledge:  Fair  Language:  Fair  Akathisia:  No  Handed:  Right  AIMS (if indicated):     Assets:  Desire for Improvement  ADL's:  Impaired  Cognition:  WNL  Sleep:        Treatment Plan Summary: Daily contact with patient to assess and evaluate symptoms and progress in treatment, Medication management and Plan 31 year old man who is currently behaving in a very angry and self-destructive manner.  Very passive aggressive behavior.  Insisting that he will kill himself if we release him from the emergency room.  On the other hand, patient's history and behavior and multiple stresses indicate that he is at high risk for suicide and other self injury meaning that they are actually probably is a reasonably high chance that he would hurt himself if released from the emergency room.  Specialist on call saw the patient earlier and deemed him to be "stable" but at this time the patient seems quite irritable and angry and demanding.  Unlikely to be safe if discharged.  Made attempts to engage him in therapeutic conversation with minimal benefit.  We currently do not have any beds available as far as I know and I will recommend he be referred out to other  hospitals for admission.  Disposition: Recommend psychiatric Inpatient admission when medically cleared. Supportive therapy provided about ongoing stressors.  Alethia Berthold, MD 09/09/2017 2:46 PM

## 2017-09-09 NOTE — ED Notes (Signed)
This RN informed that pt has deceased mothers ashes in belongings bag. Pt asked if he had family to come get and per pt, he does not have anyone to come and get remains.

## 2017-09-09 NOTE — ED Notes (Signed)
Pt given ginger ale.

## 2017-09-09 NOTE — Progress Notes (Signed)
eLink Physician-Brief Progress Note Patient Name: Mark JackMichael Q [name] DOB: 07/01/1986 MRN: [id number]   Date of Service  09/09/2017  HPI/Events of Note  31 yo male s/p intentional insulin OD with subsequent hypoglycemia. PCCM already managing the patient in the ICU. VSS. Last blood glucose = 218.  eICU Interventions  No new orders.      Intervention Category Evaluation Type: New Patient Evaluation  Lenell AntuSommer,Samona [place] 09/09/2017, 9:17 PM

## 2017-09-10 ENCOUNTER — Other Ambulatory Visit: Payer: Self-pay

## 2017-09-10 ENCOUNTER — Inpatient Hospital Stay
Admission: AD | Admit: 2017-09-10 | Discharge: 2017-09-26 | DRG: 885 | Disposition: A | Discharge status: Home / Self Care | Payer: No Typology Code available for payment source | Attending: Psychiatry | Admitting: Psychiatry

## 2017-09-10 ENCOUNTER — Encounter: Payer: Self-pay | Admitting: Internal Medicine

## 2017-09-10 DIAGNOSIS — Z882 Allergy status to sulfonamides status: Secondary | ICD-10-CM

## 2017-09-10 DIAGNOSIS — T383X2A Poisoning by insulin and oral hypoglycemic [antidiabetic] drugs, intentional self-harm, initial encounter: Secondary | ICD-10-CM | POA: Diagnosis present

## 2017-09-10 DIAGNOSIS — Z794 Long term (current) use of insulin: Secondary | ICD-10-CM | POA: Diagnosis not present

## 2017-09-10 DIAGNOSIS — F333 Major depressive disorder, recurrent, severe with psychotic symptoms: Principal | ICD-10-CM | POA: Diagnosis present

## 2017-09-10 DIAGNOSIS — F332 Major depressive disorder, recurrent severe without psychotic features: Secondary | ICD-10-CM | POA: Diagnosis present

## 2017-09-10 DIAGNOSIS — Z881 Allergy status to other antibiotic agents status: Secondary | ICD-10-CM | POA: Diagnosis not present

## 2017-09-10 DIAGNOSIS — Z590 Homelessness: Secondary | ICD-10-CM | POA: Diagnosis not present

## 2017-09-10 DIAGNOSIS — E10649 Type 1 diabetes mellitus with hypoglycemia without coma: Secondary | ICD-10-CM | POA: Diagnosis present

## 2017-09-10 DIAGNOSIS — F17210 Nicotine dependence, cigarettes, uncomplicated: Secondary | ICD-10-CM | POA: Diagnosis present

## 2017-09-10 DIAGNOSIS — R270 Ataxia, unspecified: Secondary | ICD-10-CM | POA: Diagnosis present

## 2017-09-10 DIAGNOSIS — Z79899 Other long term (current) drug therapy: Secondary | ICD-10-CM

## 2017-09-10 DIAGNOSIS — G911 Obstructive hydrocephalus: Secondary | ICD-10-CM | POA: Diagnosis present

## 2017-09-10 DIAGNOSIS — E119 Type 2 diabetes mellitus without complications: Secondary | ICD-10-CM

## 2017-09-10 DIAGNOSIS — F419 Anxiety disorder, unspecified: Secondary | ICD-10-CM | POA: Diagnosis present

## 2017-09-10 DIAGNOSIS — G4700 Insomnia, unspecified: Secondary | ICD-10-CM | POA: Diagnosis present

## 2017-09-10 DIAGNOSIS — R278 Other lack of coordination: Secondary | ICD-10-CM | POA: Diagnosis present

## 2017-09-10 DIAGNOSIS — Z880 Allergy status to penicillin: Secondary | ICD-10-CM

## 2017-09-10 DIAGNOSIS — Z765 Malingerer [conscious simulation]: Secondary | ICD-10-CM

## 2017-09-10 DIAGNOSIS — Z87442 Personal history of urinary calculi: Secondary | ICD-10-CM

## 2017-09-10 DIAGNOSIS — E1065 Type 1 diabetes mellitus with hyperglycemia: Secondary | ICD-10-CM | POA: Diagnosis present

## 2017-09-10 DIAGNOSIS — Z9119 Patient's noncompliance with other medical treatment and regimen: Secondary | ICD-10-CM | POA: Diagnosis not present

## 2017-09-10 LAB — GLUCOSE, CAPILLARY
GLUCOSE-CAPILLARY: 150 mg/dL — AB (ref 65–99)
GLUCOSE-CAPILLARY: 156 mg/dL — AB (ref 65–99)
GLUCOSE-CAPILLARY: 135 mg/dL — AB (ref 65–99)
GLUCOSE-CAPILLARY: 98 mg/dL (ref 65–99)
GLUCOSE-CAPILLARY: 92 mg/dL (ref 65–99)
GLUCOSE-CAPILLARY: 102 mg/dL — AB (ref 65–99)
Glucose-Capillary: 103 mg/dL — ABNORMAL HIGH (ref 65–99)
Glucose-Capillary: 106 mg/dL — ABNORMAL HIGH (ref 65–99)
Glucose-Capillary: 112 mg/dL — ABNORMAL HIGH (ref 65–99)
Glucose-Capillary: 125 mg/dL — ABNORMAL HIGH (ref 65–99)
Glucose-Capillary: 149 mg/dL — ABNORMAL HIGH (ref 65–99)
Glucose-Capillary: 207 mg/dL — ABNORMAL HIGH (ref 65–99)
Glucose-Capillary: 222 mg/dL — ABNORMAL HIGH (ref 65–99)
Glucose-Capillary: 36 mg/dL — CL (ref 65–99)
Glucose-Capillary: 79 mg/dL (ref 65–99)
Glucose-Capillary: 89 mg/dL (ref 65–99)
Glucose-Capillary: 92 mg/dL (ref 65–99)
Glucose-Capillary: 97 mg/dL (ref 65–99)
Glucose-Capillary: 99 mg/dL (ref 65–99)

## 2017-09-10 LAB — CBC WITH DIFFERENTIAL/PLATELET
BASOS PCT: 1 %
Basophils Absolute: 0 10*3/uL (ref 0–0.1)
EOS ABS: 0.3 10*3/uL (ref 0–0.7)
Eosinophils Relative: 4 %
HCT: 36.8 % — ABNORMAL LOW (ref 40.0–52.0)
HEMOGLOBIN: 12.6 g/dL — AB (ref 13.0–18.0)
Lymphocytes Relative: 34 %
Lymphs Abs: 2.9 10*3/uL (ref 1.0–3.6)
MCH: 33.7 pg (ref 26.0–34.0)
MCHC: 34.1 g/dL (ref 32.0–36.0)
MCV: 98.8 fL (ref 80.0–100.0)
MONO ABS: 0.5 10*3/uL (ref 0.2–1.0)
MONOS PCT: 6 %
NEUTROS ABS: 4.7 10*3/uL (ref 1.4–6.5)
NEUTROS PCT: 55 %
Platelets: 300 10*3/uL (ref 150–440)
RBC: 3.73 MIL/uL — ABNORMAL LOW (ref 4.40–5.90)
RDW: 15 % — AB (ref 11.5–14.5)
WBC: 8.5 10*3/uL (ref 3.8–10.6)

## 2017-09-10 LAB — BASIC METABOLIC PANEL
Anion gap: 5 (ref 5–15)
BUN: 6 mg/dL (ref 6–20)
CHLORIDE: 109 mmol/L (ref 101–111)
CO2: 26 mmol/L (ref 22–32)
CREATININE: 0.66 mg/dL (ref 0.61–1.24)
Calcium: 8.4 mg/dL — ABNORMAL LOW (ref 8.9–10.3)
GFR calc non Af Amer: >60 mL/min (ref >60)
Glucose, Bld: 98 mg/dL (ref 65–99)
POTASSIUM: 3.8 mmol/L (ref 3.5–5.1)
SODIUM: 140 mmol/L (ref 135–145)

## 2017-09-10 MED ORDER — TRAZODONE HCL 100 MG PO TABS
100.0000 mg | ORAL_TABLET | Freq: Every evening | ORAL | Status: DC | PRN
  Administered 2017-09-10: 100 mg via ORAL
  Filled 2017-09-10: qty 1

## 2017-09-10 MED ORDER — INSULIN ASPART 100 UNIT/ML ~~LOC~~ SOLN
0.0000 [IU] | Freq: Three times a day (TID) | SUBCUTANEOUS | Status: DC
  Administered 2017-09-11: 20 [IU] via SUBCUTANEOUS
  Administered 2017-09-12: 11 [IU] via SUBCUTANEOUS
  Administered 2017-09-12: 2 [IU] via SUBCUTANEOUS
  Administered 2017-09-12: 8 [IU] via SUBCUTANEOUS
  Administered 2017-09-13: 15 [IU] via SUBCUTANEOUS
  Administered 2017-09-13: 3 [IU] via SUBCUTANEOUS
  Filled 2017-09-10 (×2): qty 1

## 2017-09-10 MED ORDER — POLYETHYLENE GLYCOL 3350 17 G PO PACK: 17.0000 g | PACK | Freq: Every day | ORAL | Status: DC | PRN

## 2017-09-10 MED ORDER — INSULIN ASPART 100 UNIT/ML ~~LOC~~ SOLN
0.0000 [IU] | Freq: Three times a day (TID) | SUBCUTANEOUS | Status: DC
  Administered 2017-09-10: 5 [IU] via SUBCUTANEOUS
  Filled 2017-09-10: qty 1

## 2017-09-10 MED ORDER — INSULIN ASPART 100 UNIT/ML ~~LOC~~ SOLN: 0.0000 [IU] | Freq: Every day | SUBCUTANEOUS | Status: DC

## 2017-09-10 MED ORDER — VENLAFAXINE HCL ER 75 MG PO CP24
150.0000 mg | ORAL_CAPSULE | Freq: Every day | ORAL | Status: DC
  Administered 2017-09-12 – 2017-09-13 (×2): 150 mg via ORAL
  Filled 2017-09-10 (×3): qty 2

## 2017-09-10 MED ORDER — INFLUENZA VAC SPLIT QUAD 0.5 ML IM SUSY: 0.5000 mL | PREFILLED_SYRINGE | INTRAMUSCULAR | Status: DC

## 2017-09-10 MED ORDER — INSULIN GLARGINE 100 UNIT/ML ~~LOC~~ SOLN
15.0000 [IU] | Freq: Every day | SUBCUTANEOUS | Status: DC
  Administered 2017-09-10: 15 [IU] via SUBCUTANEOUS
  Filled 2017-09-10 (×2): qty 0.15

## 2017-09-10 MED ORDER — MAGNESIUM HYDROXIDE 400 MG/5ML PO SUSP: 30.0000 mL | Freq: Every day | ORAL | Status: DC | PRN

## 2017-09-10 MED ORDER — ACETAMINOPHEN 325 MG PO TABS
650.0000 mg | ORAL_TABLET | Freq: Four times a day (QID) | ORAL | Status: DC | PRN
  Administered 2017-09-17: 650 mg via ORAL
  Filled 2017-09-10: qty 2

## 2017-09-10 MED ORDER — INSULIN GLARGINE 100 UNIT/ML ~~LOC~~ SOLN
15.0000 [IU] | Freq: Every day | SUBCUTANEOUS | Status: DC
  Administered 2017-09-11: 15 [IU] via SUBCUTANEOUS
  Filled 2017-09-10: qty 0.15

## 2017-09-10 MED ORDER — ALUM & MAG HYDROXIDE-SIMETH 200-200-20 MG/5ML PO SUSP: 30.0000 mL | ORAL | Status: DC | PRN

## 2017-09-10 MED ORDER — MIRTAZAPINE 15 MG PO TABS
30.0000 mg | ORAL_TABLET | Freq: Every day | ORAL | Status: DC
  Administered 2017-09-10 – 2017-09-25 (×16): 30 mg via ORAL
  Filled 2017-09-10 (×16): qty 2

## 2017-09-10 MED ORDER — NICOTINE 21 MG/24HR TD PT24
21.0000 mg | MEDICATED_PATCH | Freq: Every day | TRANSDERMAL | Status: DC
  Administered 2017-09-12 – 2017-09-26 (×13): 21 mg via TRANSDERMAL
  Filled 2017-09-10 (×15): qty 1

## 2017-09-10 MED ORDER — KCL IN DEXTROSE-NACL 40-5-0.45 MEQ/L-%-% IV SOLN
INTRAVENOUS | Status: DC
  Filled 2017-09-10: qty 1000

## 2017-09-10 MED ORDER — INSULIN GLARGINE 100 UNIT/ML ~~LOC~~ SOLN: 38.0000 [IU] | SUBCUTANEOUS | 0 refills | Status: DC

## 2017-09-10 MED ORDER — INSULIN ASPART 100 UNIT/ML ~~LOC~~ SOLN
0.0000 [IU] | Freq: Every day | SUBCUTANEOUS | Status: DC
Start: 2017-09-10 — End: 2017-09-14
  Administered 2017-09-10: 2 [IU] via SUBCUTANEOUS
  Administered 2017-09-12: 5 [IU] via SUBCUTANEOUS
  Filled 2017-09-10 (×2): qty 1

## 2017-09-10 NOTE — Plan of Care (Signed)
Patient medically cleared today and ready for admission to Behavioral health. IV fluids stopped and patient transitioned back onto insulin regimen. Patient denies any pain this shift and denies any needs. Patient denies any suicidal or homicidal ideations to this nurse, however patient is IVC ed and per Pysch MD did verbalized these thoughts with plan to doctor. Patient has bee calm and cooperative, 1:1 sitter and bedside for continuous monitoring. VSS. [facility].

## 2017-09-10 NOTE — Progress Notes (Signed)
[facility]   PATIENT NAME: Mark [name]    MR#:  [id number]  DATE OF BIRTH:  01/11/1986  SUBJECTIVE:  CHIEF COMPLAINT:   Chief Complaint  Patient presents with  . Dizziness   Patient laying in bed.  Flat affect.  REVIEW OF SYSTEMS:    Review of Systems  Constitutional: Positive for malaise/fatigue. Negative for chills and fever.  HENT: Negative for sore throat.   Eyes: Negative for blurred vision, double vision and pain.  Respiratory: Negative for cough, hemoptysis, shortness of breath and wheezing.   Cardiovascular: Negative for chest pain, palpitations, orthopnea and leg swelling.  Gastrointestinal: Negative for abdominal pain, constipation, diarrhea, heartburn, nausea and vomiting.  Genitourinary: Negative for dysuria and hematuria.  Musculoskeletal: Negative for back pain and joint pain.  Skin: Negative for rash.  Neurological: Negative for sensory change, speech change, focal weakness and headaches.  Endo/Heme/Allergies: Does not bruise/bleed easily.  Psychiatric/Behavioral: Negative for depression. The patient is not nervous/anxious.     DRUG ALLERGIES:   Allergies  Allergen Reactions  . Penicillins Anaphylaxis, Hives and Swelling    Has patient had a PCN reaction causing immediate rash, facial/tongue/throat swelling, SOB or lightheadedness with hypotension: Yes Has patient had a PCN reaction causing severe rash involving mucus membranes or skin necrosis: No Has patient had a PCN reaction that required hospitalization Yes Has patient had a PCN reaction occurring within the last 10 years: Yes If all of the above answers are "NO", then may proceed with Cephalosporin use.  . Sulfa Antibiotics Anaphylaxis and Hives  . Clindamycin/Lincomycin Hives    VITALS:  Blood pressure 107/72, pulse 63, temperature 98.1 F (36.7 C), temperature source Oral, resp. rate 10, height 6\' 2"  (1.88 m), weight 97.1 kg (214 lb 1.1 oz), SpO2 98  %.  PHYSICAL EXAMINATION:   Physical Exam  GENERAL:  31 y.o.-year-old patient lying in the bed with no acute distress.  EYES: Pupils equal, round, reactive to light and accommodation. No scleral icterus. Extraocular muscles intact.  HEENT: Head atraumatic, normocephalic. Oropharynx and nasopharynx clear.  NECK:  Supple, no jugular venous distention. No thyroid enlargement, no tenderness.  LUNGS: Normal breath sounds bilaterally, no wheezing, rales, rhonchi. No use of accessory muscles of respiration.  CARDIOVASCULAR: S1, S2 normal. No murmurs, rubs, or gallops.  ABDOMEN: Soft, nontender, nondistended. Bowel sounds present. No organomegaly or mass.  EXTREMITIES: No cyanosis, clubbing or edema b/l.    NEUROLOGIC: Cranial nerves II through XII are intact. No focal Motor or sensory deficits b/l.   PSYCHIATRIC: The patient is alert and oriented x 3.  SKIN: No obvious rash, lesion, or ulcer.   LABORATORY PANEL:   CBC Recent Labs  Lab 09/10/17 0428  WBC 8.5  HGB 12.6*  HCT 36.8*  PLT 300   ------------------------------------------------------------------------------------------------------------------ Chemistries  Recent Labs  Lab 09/09/17 0732 09/09/17 2012 09/10/17 0428  NA 139  --  140  K 3.4*  --  3.8  CL 104  --  109  CO2 24  --  26  GLUCOSE 49*  --  98  BUN 9  --  6  CREATININE 0.81  --  0.66  CALCIUM 9.1  --  8.4*  MG  --  1.9  --   AST 62*  --   --   ALT 38  --   --   ALKPHOS 102  --   --   BILITOT 0.4  --   --    ------------------------------------------------------------------------------------------------------------------  Cardiac Enzymes No results for input(s): TROPONINI in the last 168 hours. ------------------------------------------------------------------------------------------------------------------  RADIOLOGY:  No results found.   ASSESSMENT AND PLAN:   * Hypoglycemia due to intentional insulin overdose Blood sugars are improved.  Stop  D5.  Restart lower dose of home insulin to prevent patient going into DKA. Accu-Cheks.  *Depression with suicidal ideation.  Transfer to inpatient behavioral unit  His hypokalemia has resolved  All the records are reviewed and case discussed with Care Management/Social Worker Management plans discussed with the patient, family and they are in agreement.  CODE STATUS: FULL CODE  DVT Prophylaxis: SCDs  TOTAL TIME TAKING CARE OF THIS PATIENT: 30 minutes.   POSSIBLE D/C IN 1-2 DAYS, DEPENDING ON CLINICAL CONDITION.  Molinda BailiffSrikar R Raymond Azure M.D on 09/10/2017 at 12:43 PM  Between 7am to 6pm - Pager - [phone]  After 6pm go to [email or web address] - password [email or web address]  [facility] [place] [facility]  Office  [phone]  CC: Primary care physician; Patient, No Pcp Per  Note: This dictation was prepared with Dragon dictation along with smaller phrase technology. Any transcriptional errors that result from this process are unintentional.

## 2017-09-10 NOTE — Tx Team (Signed)
Initial Treatment Plan 09/10/2017 11:14 PM Mark JackMichael Q Colquitt ZOX:[id number]    PATIENT STRESSORS: Health problems Loss of Mother Medication change or noncompliance Substance abuse   PATIENT STRENGTHS: Average or above average intelligence Capable of independent living   PATIENT IDENTIFIED PROBLEMS: Mood Instabilty  Ineffective Coping Skills  [place]  Medication NonCompliance  DM-I             DISCHARGE CRITERIA:  Adequate post-discharge living arrangements Improved stabilization in mood, thinking, and/or behavior Motivation to continue treatment in a less acute level of care Reduction of life-threatening or endangering symptoms to within safe limits Verbal commitment to aftercare and medication compliance  PRELIMINARY DISCHARGE PLAN: Outpatient therapy Placement in alternative living arrangements  PATIENT/FAMILY INVOLVEMENT: This treatment plan has been presented to and reviewed with the patient, Mark [name],.  The patient have been given the opportunity to ask questions and make suggestions.  Cleotis NipperAbiodun T Rosalinda Seaman, RN 09/10/2017, 11:14 PM

## 2017-09-10 NOTE — Consult Note (Signed)
[facility] Face-to-Face Psychiatry Consult   Reason for Consult: Consult for 31 year old man who was transferred to the intensive care unit yesterday evening after his blood sugar had dropped.  He had been in the emergency room being held for psychiatric admission at the time Referring Physician:  [place] Patient Identification: Mark [name] MRN:  [id number] Principal Diagnosis: Severe recurrent major depression without psychotic features [facility]) Diagnosis:   Patient Active Problem List   Diagnosis Date Noted  . Hypoglycemia [E16.2] 09/09/2017  . MDD (major depressive disorder) [F32.9] 08/22/2017  . Severe recurrent major depression without psychotic features ([place]) [F33.2] 08/19/2017  . Nausea & vomiting [R11.2] 08/18/2017  . Abdominal pain [R10.9] 08/18/2017  . Hyponatremia [E87.1] 08/18/2017  . DKA (diabetic ketoacidoses) ([place]) [E13.10] 11/24/2015  . Diabetic keto-acidosis ([place]) [E13.10] 10/23/2015    Total Time spent with patient: 1 hour  Subjective:   Mark [name] is a 31 y.o. male patient admitted with "I do not feel good" patient interviewed.  Chart reviewed.Marland Kitchen  HPI: Patient reviewed was seen in the emergency room yesterday where I had evaluated him for suicidal thoughts.  See yesterday's note.  At that time he was very dramatically stating that he would kill himself without any possibility of changing his mind.  Subsequently apparently his blood sugar was found to be dropping and dropped down into life-threatening range.  From what I can see in the chart it appears that at least someone thought it likely that he had injected himself intentionally with excessive insulin.  I asked him about that today and he absolutely denies it very angrily.  He still however reports being very depressed and having suicidal thoughts.  Not reporting hallucinations.  Not sleeping well.  Eating a little bit better so he is a little more cooperative with treatment.  Blood sugars appear to be stabilizing  somewhat.  All other labs appear to be stabilizing.  Social history: Patient emphasizes how little social support he has.  His mother died not long ago and that was his last contact with anyone supportive in the world.  He does not get disability and cannot hold a job and has no place to live.  Medical history: Patient has diabetes insulin-dependent and a long history of poor self-care that has led to multiple admissions for diabetic ketoacidosis.  Substance abuse history: Denies alcohol or drug abuse  Past Psychiatric History: Patient has been seen for depression previously and in fact was admitted to the psychiatric service just earlier this month.  He was discharged with signs of improvement but rapidly came back to the hospital after losing his room at the boardinghouse and not functioning well at the shelter.  He does have a history of suicidal behavior and threats in the past.  History of noncompliance.  Risk to Self: Is patient at risk for suicide?: No Risk to Others:   Prior Inpatient Therapy:   Prior Outpatient Therapy:    Past Medical History:  Past Medical History:  Diagnosis Date  . Diabetes mellitus without complication ([place])   . Hydrocephalus   . Kidney stones     Past Surgical History:  Procedure Laterality Date  . KIDNEY STONE SURGERY     Family History:  Family History  Problem Relation Age of Onset  . Breast cancer Mother    Family Psychiatric  History: Depression Social History:  Social History   Substance and Sexual Activity  Alcohol Use No   Comment: ocasional      Social History  Substance and Sexual Activity  Drug Use Yes  . Types: Marijuana   Comment: "as often as I can"    Social History   Socioeconomic History  . Marital status: Single    Spouse name: None  . Number of children: None  . Years of education: None  . Highest education level: None  Social Needs  . Financial resource strain: None  . Food insecurity - worry: None  . Food  insecurity - inability: None  . Transportation needs - medical: None  . Transportation needs - non-medical: None  Occupational History  . None  Tobacco Use  . Smoking status: Current Every Day Smoker    Packs/day: 1.00    Types: Cigarettes  . Smokeless tobacco: Never Used  Substance and Sexual Activity  . Alcohol use: No    Comment: ocasional   . Drug use: Yes    Types: Marijuana    Comment: "as often as I can"  . Sexual activity: None  Other Topics Concern  . None  Social History Narrative  . None   Additional Social History:    Allergies:   Allergies  Allergen Reactions  . Penicillins Anaphylaxis, Hives and Swelling    Has patient had a PCN reaction causing immediate rash, facial/tongue/throat swelling, SOB or lightheadedness with hypotension: Yes Has patient had a PCN reaction causing severe rash involving mucus membranes or skin necrosis: No Has patient had a PCN reaction that required hospitalization Yes Has patient had a PCN reaction occurring within the last 10 years: Yes If all of the above answers are "NO", then may proceed with Cephalosporin use.  . Sulfa Antibiotics Anaphylaxis and Hives  . Clindamycin/Lincomycin Hives    Labs:  Results for orders placed or performed during the hospital encounter of 09/09/17 (from the past 48 hour(s))  Glucose, capillary     Status: Abnormal   Collection Time: 09/09/17  7:31 AM  Result Value Ref Range   Glucose-Capillary 50 (L) 65 - 99 mg/dL  CBC with Differential/Platelet     Status: Abnormal   Collection Time: 09/09/17  7:32 AM  Result Value Ref Range   WBC 12.3 (H) 3.8 - 10.6 K/uL   RBC 4.10 (L) 4.40 - 5.90 MIL/uL   Hemoglobin 13.5 13.0 - 18.0 g/dL   HCT 39.8 (L) 40.0 - 52.0 %   MCV 97.1 80.0 - 100.0 fL   MCH 32.8 26.0 - 34.0 pg   MCHC 33.8 32.0 - 36.0 g/dL   RDW 14.4 11.5 - 14.5 %   Platelets 346 150 - 440 K/uL   Neutrophils Relative % 82 %   Neutro Abs 10.1 (H) 1.4 - 6.5 K/uL   Lymphocytes Relative 14 %    Lymphs Abs 1.7 1.0 - 3.6 K/uL   Monocytes Relative 4 %   Monocytes Absolute 0.5 0.2 - 1.0 K/uL   Eosinophils Relative 0 %   Eosinophils Absolute 0.0 0 - 0.7 K/uL   Basophils Relative 0 %   Basophils Absolute 0.0 0 - 0.1 K/uL  Comprehensive metabolic panel     Status: Abnormal   Collection Time: 09/09/17  7:32 AM  Result Value Ref Range   Sodium 139 135 - 145 mmol/L   Potassium 3.4 (L) 3.5 - 5.1 mmol/L   Chloride 104 101 - 111 mmol/L   CO2 24 22 - 32 mmol/L   Glucose, Bld 49 (L) 65 - 99 mg/dL   BUN 9 6 - 20 mg/dL   Creatinine, Ser 0.81 0.61 - 1.24  mg/dL   Calcium 9.1 8.9 - 10.3 mg/dL   Total Protein 7.1 6.5 - 8.1 g/dL   Albumin 3.8 3.5 - 5.0 g/dL   AST 62 (H) 15 - 41 U/L   ALT 38 17 - 63 U/L   Alkaline Phosphatase 102 38 - 126 U/L   Total Bilirubin 0.4 0.3 - 1.2 mg/dL   GFR calc non Af Amer >60 >60 mL/min   GFR calc Af Amer >60 >60 mL/min    Comment: (NOTE) The eGFR has been calculated using the CKD EPI equation. This calculation has not been validated in all clinical situations. eGFR's persistently <60 mL/min signify possible Chronic Kidney Disease.    Anion gap 11 5 - 15  Blood gas, venous     Status: Abnormal   Collection Time: 09/09/17  7:32 AM  Result Value Ref Range   pH, Ven 7.35 7.250 - 7.430   pCO2, Ven 58 44.0 - 60.0 mmHg   pO2, Ven UNABLE TO CALCULATE. 32.0 - 45.0 mmHg   Bicarbonate 32.0 (H) 20.0 - 28.0 mmol/L   Acid-Base Excess 4.8 (H) 0.0 - 2.0 mmol/L   Patient temperature 37.0    Collection site VEIN    Sample type VENIPUNCTURE   Urinalysis, Complete w Microscopic     Status: Abnormal   Collection Time: 09/09/17  9:07 AM  Result Value Ref Range   Color, Urine YELLOW (A) YELLOW   APPearance CLEAR (A) CLEAR   Specific Gravity, Urine 1.015 1.005 - 1.030   pH 5.0 5.0 - 8.0   Glucose, UA >=500 (A) NEGATIVE mg/dL   Hgb urine dipstick SMALL (A) NEGATIVE   Bilirubin Urine NEGATIVE NEGATIVE   Ketones, ur NEGATIVE NEGATIVE mg/dL   Protein, ur NEGATIVE  NEGATIVE mg/dL   Nitrite NEGATIVE NEGATIVE   Leukocytes, UA NEGATIVE NEGATIVE   RBC / HPF 6-30 0 - 5 RBC/hpf   WBC, UA 0-5 0 - 5 WBC/hpf   Bacteria, UA NONE SEEN NONE SEEN   Squamous Epithelial / LPF NONE SEEN NONE SEEN   Mucus PRESENT    Ca Oxalate Crys, UA PRESENT   Glucose, capillary     Status: Abnormal   Collection Time: 09/09/17  9:38 AM  Result Value Ref Range   Glucose-Capillary 230 (H) 65 - 99 mg/dL  Glucose, capillary     Status: None   Collection Time: 09/09/17 12:47 PM  Result Value Ref Range   Glucose-Capillary 65 65 - 99 mg/dL   Comment 1 Notify RN   Glucose, capillary     Status: Abnormal   Collection Time: 09/09/17  2:03 PM  Result Value Ref Range   Glucose-Capillary 36 (LL) 65 - 99 mg/dL   Comment 1 Notify RN    Comment 2 Call MD NNP PA CNM    Comment 3 REPEATED TO VERIFY, CHARGE CREDITED     Comment: Performed at [facility], [address] [place], [place] [address]  Glucose, capillary     Status: Abnormal   Collection Time: 09/09/17  2:04 PM  Result Value Ref Range   Glucose-Capillary 38 (LL) 65 - 99 mg/dL   Comment 1 Notify RN    Comment 2 Document in Chart    Comment 3 Repeat Test   Glucose, capillary     Status: Abnormal   Collection Time: 09/09/17  2:50 PM  Result Value Ref Range   Glucose-Capillary 61 (L) 65 - 99 mg/dL   Comment 1 Notify RN   Glucose, capillary  Status: Abnormal   Collection Time: 09/09/17  4:11 PM  Result Value Ref Range   Glucose-Capillary 51 (L) 65 - 99 mg/dL  Glucose, capillary     Status: Abnormal   Collection Time: 09/09/17  6:17 PM  Result Value Ref Range   Glucose-Capillary 56 (L) 65 - 99 mg/dL  Glucose, capillary     Status: Abnormal   Collection Time: 09/09/17  7:15 PM  Result Value Ref Range   Glucose-Capillary 218 (H) 65 - 99 mg/dL  MRSA PCR Screening     Status: None   Collection Time: 09/09/17  8:06 PM  Result Value Ref Range   MRSA by PCR NEGATIVE NEGATIVE    Comment:        The GeneXpert MRSA  Assay (FDA approved for NASAL specimens only), is one component of a comprehensive MRSA colonization surveillance program. It is not intended to diagnose MRSA infection nor to guide or monitor treatment for MRSA infections.   Magnesium     Status: None   Collection Time: 09/09/17  8:12 PM  Result Value Ref Range   Magnesium 1.9 1.7 - 2.4 mg/dL  Glucose, capillary     Status: Abnormal   Collection Time: 09/09/17  8:15 PM  Result Value Ref Range   Glucose-Capillary 150 (H) 65 - 99 mg/dL  Glucose, capillary     Status: Abnormal   Collection Time: 09/09/17  9:56 PM  Result Value Ref Range   Glucose-Capillary 156 (H) 65 - 99 mg/dL  Glucose, capillary     Status: Abnormal   Collection Time: 09/10/17 12:02 AM  Result Value Ref Range   Glucose-Capillary 125 (H) 65 - 99 mg/dL  Glucose, capillary     Status: Abnormal   Collection Time: 09/10/17  2:39 AM  Result Value Ref Range   Glucose-Capillary 112 (H) 65 - 99 mg/dL  Glucose, capillary     Status: None   Collection Time: 09/10/17  4:06 AM  Result Value Ref Range   Glucose-Capillary 97 65 - 99 mg/dL  Basic metabolic panel     Status: Abnormal   Collection Time: 09/10/17  4:28 AM  Result Value Ref Range   Sodium 140 135 - 145 mmol/L   Potassium 3.8 3.5 - 5.1 mmol/L   Chloride 109 101 - 111 mmol/L   CO2 26 22 - 32 mmol/L   Glucose, Bld 98 65 - 99 mg/dL   BUN 6 6 - 20 mg/dL   Creatinine, Ser 0.66 0.61 - 1.24 mg/dL   Calcium 8.4 (L) 8.9 - 10.3 mg/dL   GFR calc non Af Amer >60 >60 mL/min   GFR calc Af Amer >60 >60 mL/min    Comment: (NOTE) The eGFR has been calculated using the CKD EPI equation. This calculation has not been validated in all clinical situations. eGFR's persistently <60 mL/min signify possible Chronic Kidney Disease.    Anion gap 5 5 - 15  CBC with Differential/Platelet     Status: Abnormal   Collection Time: 09/10/17  4:28 AM  Result Value Ref Range   WBC 8.5 3.8 - 10.6 K/uL   RBC 3.73 (L) 4.40 - 5.90  MIL/uL   Hemoglobin 12.6 (L) 13.0 - 18.0 g/dL   HCT 36.8 (L) 40.0 - 52.0 %   MCV 98.8 80.0 - 100.0 fL   MCH 33.7 26.0 - 34.0 pg   MCHC 34.1 32.0 - 36.0 g/dL   RDW 15.0 (H) 11.5 - 14.5 %   Platelets 300 150 - 440 K/uL  Neutrophils Relative % 55 %   Neutro Abs 4.7 1.4 - 6.5 K/uL   Lymphocytes Relative 34 %   Lymphs Abs 2.9 1.0 - 3.6 K/uL   Monocytes Relative 6 %   Monocytes Absolute 0.5 0.2 - 1.0 K/uL   Eosinophils Relative 4 %   Eosinophils Absolute 0.3 0 - 0.7 K/uL   Basophils Relative 1 %   Basophils Absolute 0.0 0 - 0.1 K/uL  Glucose, capillary     Status: None   Collection Time: 09/10/17  5:00 AM  Result Value Ref Range   Glucose-Capillary 99 65 - 99 mg/dL  Glucose, capillary     Status: Abnormal   Collection Time: 09/10/17  6:20 AM  Result Value Ref Range   Glucose-Capillary 106 (H) 65 - 99 mg/dL  Glucose, capillary     Status: Abnormal   Collection Time: 09/10/17  7:00 AM  Result Value Ref Range   Glucose-Capillary 103 (H) 65 - 99 mg/dL   Comment 1 Document in Chart   Glucose, capillary     Status: Abnormal   Collection Time: 09/10/17  7:58 AM  Result Value Ref Range   Glucose-Capillary 135 (H) 65 - 99 mg/dL  Glucose, capillary     Status: None   Collection Time: 09/10/17  9:13 AM  Result Value Ref Range   Glucose-Capillary 98 65 - 99 mg/dL  Glucose, capillary     Status: None   Collection Time: 09/10/17 10:17 AM  Result Value Ref Range   Glucose-Capillary 92 65 - 99 mg/dL  Glucose, capillary     Status: Abnormal   Collection Time: 09/10/17 11:04 AM  Result Value Ref Range   Glucose-Capillary 102 (H) 65 - 99 mg/dL  Glucose, capillary     Status: None   Collection Time: 09/10/17 12:00 PM  Result Value Ref Range   Glucose-Capillary 92 65 - 99 mg/dL  Glucose, capillary     Status: None   Collection Time: 09/10/17 12:59 PM  Result Value Ref Range   Glucose-Capillary 89 65 - 99 mg/dL    Current Facility-Administered Medications  Medication Dose Route  Frequency Provider Last Rate Last Dose  . acetaminophen (TYLENOL) tablet 650 mg  650 mg Oral Q6H PRN Salary, Montell D, MD       Or  . acetaminophen (TYLENOL) suppository 650 mg  650 mg Rectal Q6H PRN Salary, Montell D, MD      . enoxaparin (LOVENOX) injection 40 mg  40 mg Subcutaneous Q24H Salary, Montell D, MD   40 mg at 09/09/17 2100  . insulin aspart (novoLOG) injection 0-15 Units  0-15 Units Subcutaneous TID WC Wilhelmina Mcardle, MD      . insulin aspart (novoLOG) injection 0-5 Units  0-5 Units Subcutaneous QHS Wilhelmina Mcardle, MD      . insulin glargine (LANTUS) injection 15 Units  15 Units Subcutaneous Daily Wilhelmina Mcardle, MD   15 Units at 09/10/17 1122  . mirtazapine (REMERON) tablet 30 mg  30 mg Oral QHS Salary, Holly Bodily D, MD   30 mg at 09/09/17 2059  . ondansetron (ZOFRAN) injection 4 mg  4 mg Intravenous Q6H PRN Salary, Montell D, MD      . polyethylene glycol (MIRALAX / GLYCOLAX) packet 17 g  17 g Oral Daily PRN Salary, Montell D, MD      . sodium chloride flush (NS) 0.9 % injection 3 mL  3 mL Intravenous Q12H Salary, Montell D, MD   3 mL at 09/10/17 1124  .  traZODone (DESYREL) tablet 100 mg  100 mg Oral QHS PRN Loney Hering D, MD   100 mg at 09/09/17 2104  . venlafaxine XR (EFFEXOR-XR) 24 hr capsule 150 mg  150 mg Oral Q breakfast Salary, Montell D, MD   150 mg at 09/09/17 2059    Musculoskeletal: Strength & Muscle Tone: within normal limits Gait & Station: unsteady Patient leans: N/A  Psychiatric Specialty Exam: Physical Exam  Nursing note and vitals reviewed. Constitutional: He appears well-developed and well-nourished.  HENT:  Head: Normocephalic and atraumatic.  Eyes: Conjunctivae are normal. Pupils are equal, round, and reactive to light.  Neck: Normal range of motion.  Cardiovascular: Regular rhythm and normal heart sounds.  Respiratory: Effort normal and breath sounds normal. No respiratory distress.  GI: Soft.  Musculoskeletal: Normal range of motion.   Neurological: He is alert.  Skin: Skin is warm and dry.  Psychiatric: His affect is angry and blunt. He is slowed and withdrawn. Cognition and memory are impaired. He expresses impulsivity and inappropriate judgment. He exhibits a depressed mood. He expresses suicidal ideation.    Review of Systems  Constitutional: Negative.   HENT: Negative.   Eyes: Negative.   Respiratory: Negative.   Cardiovascular: Negative.   Gastrointestinal: Negative.   Musculoskeletal: Negative.   Skin: Negative.   Neurological: Negative.   Psychiatric/Behavioral: Positive for depression and suicidal ideas. Negative for hallucinations, memory loss and substance abuse. The patient has insomnia. The patient is not nervous/anxious.     Blood pressure 111/71, pulse 69, temperature 98.1 F (36.7 C), temperature source Oral, resp. rate 11, height _0  (1.88 m), weight 97.1 kg (214 lb 1.1 oz), SpO2 95 %.Body mass index is 27.48 kg/m.  General Appearance: Disheveled  Eye Contact:  Fair  Speech:  Garbled and Slow  Volume:  Decreased  Mood:  Depressed, Dysphoric and Irritable  Affect:  Congruent  Thought Process:  Disorganized  Orientation:  Full (Time, Place, and Person)  Thought Content:  Illogical, Rumination and Tangential  Suicidal Thoughts:  Yes.  with intent/plan  Homicidal Thoughts:  No  Memory:  Immediate;   Good Recent;   Fair Remote;   Fair  Judgement:  Intact  Insight:  Shallow  Psychomotor Activity:  Decreased  Concentration:  Concentration: Poor  Recall:  [employer] of Knowledge:  Fair  Language:  Fair  Akathisia:  No  Handed:  Right  AIMS (if indicated):     Assets:  Desire for Improvement  ADL's:  Impaired  Cognition:  Impaired,  Mild  Sleep:        Treatment Plan Summary: Daily contact with patient to assess and evaluate symptoms and progress in treatment, Medication management and Plan As far as his depression he had been restarted on his antidepressant medicine.  That medicine  will continue.  He also currently has a sitter because of his suicidality.  That was assessed and judged to need to be continued.  As far as his diabetes he appears to be coming under control.  Patient has been educated.  He will continue on current regimen once he gets downstairs I would recommend a medicine consult  to continue with his treatment of his diabetes.  Case reviewed with TTS and nursing.  IVC reviewed.  Bed hopefully will be available later today.  Disposition: Recommend psychiatric Inpatient admission when medically cleared. Supportive therapy provided about ongoing stressors.  Alethia Berthold, MD 09/10/2017 4:07 PM

## 2017-09-10 NOTE — BH Assessment (Signed)
[facility] by Dr. Toni Amendlapacs.  Attending Physician will be Dr. Jennet MaduroPucilowska.   [facility] 303[facility] [place].

## 2017-09-10 NOTE — Clinical Social Work Note (Signed)
CSW consulted for homeless issues. Patient has been a resident at the homeless shelter for some time now and will return to the homeless shelter at discharge. Please re consult CSW if needed but at this time there are no needs. Mark [name] MSW,LCSW [phone]

## 2017-09-10 NOTE — Progress Notes (Signed)
Hypoglycemia has resolved. I initiated Lantus at a much reduced dose with sliding scale insulin to prevent the development of DKA. I have placed orders for transfer to MedSurg floor with a sitter. Plan is for him to be transferred to behavioral health when a bed is available. After transfer out of the ICU, PCCM will sign off. Please call if we can be of further assistance    Mark Fischeravid Nayib Remer, MD PCCM service Mobile [phone] Pager [phone] 09/10/2017 3:01 PM

## 2017-09-10 NOTE — Progress Notes (Signed)
CBG 125 at this time glucometer not transferring patient results. Previous value 156 at 2200. Continues D5 with potassium

## 2017-09-11 DIAGNOSIS — E119 Type 2 diabetes mellitus without complications: Secondary | ICD-10-CM

## 2017-09-11 LAB — GLUCOSE, CAPILLARY
GLUCOSE-CAPILLARY: 282 mg/dL — AB (ref 65–99)
GLUCOSE-CAPILLARY: 228 mg/dL — AB (ref 65–99)
GLUCOSE-CAPILLARY: 412 mg/dL — AB (ref 65–99)
GLUCOSE-CAPILLARY: 488 mg/dL — AB (ref 65–99)
Glucose-Capillary: 111 mg/dL — ABNORMAL HIGH (ref 65–99)

## 2017-09-11 LAB — LIPID PANEL
Cholesterol: 217 mg/dL — ABNORMAL HIGH (ref 0–200)
HDL: 49 mg/dL (ref >40)
LDL CALC: 141 mg/dL — AB (ref 0–99)
Total CHOL/HDL Ratio: 4.4 RATIO
Triglycerides: 135 mg/dL (ref <150)
VLDL: 27 mg/dL (ref 0–40)

## 2017-09-11 LAB — HEMOGLOBIN A1C
HEMOGLOBIN A1C: 8.7 % — AB (ref 4.8–5.6)
Mean Plasma Glucose: 202.99 mg/dL

## 2017-09-11 LAB — TSH: TSH: 11.747 u[IU]/mL — AB (ref 0.350–4.500)

## 2017-09-11 MED ORDER — INSULIN GLARGINE 100 UNIT/ML ~~LOC~~ SOLN
10.0000 [IU] | Freq: Once | SUBCUTANEOUS | Status: AC
  Administered 2017-09-11: 10 [IU] via SUBCUTANEOUS
  Filled 2017-09-11: qty 0.1

## 2017-09-11 MED ORDER — GLUCERNA SHAKE PO LIQD
237.0000 mL | Freq: Three times a day (TID) | ORAL | Status: DC
  Administered 2017-09-11 – 2017-09-25 (×17): 237 mL via ORAL

## 2017-09-11 MED ORDER — GABAPENTIN 400 MG PO CAPS
1200.0000 mg | ORAL_CAPSULE | Freq: Three times a day (TID) | ORAL | Status: DC
  Administered 2017-09-11 – 2017-09-26 (×43): 1200 mg via ORAL
  Filled 2017-09-11 (×43): qty 3

## 2017-09-11 MED ORDER — ZOLPIDEM TARTRATE 5 MG PO TABS
10.0000 mg | ORAL_TABLET | Freq: Every day | ORAL | Status: DC
  Administered 2017-09-11 – 2017-09-16 (×6): 10 mg via ORAL
  Filled 2017-09-11 (×6): qty 2

## 2017-09-11 MED ORDER — GABAPENTIN 300 MG PO CAPS: 300.0000 mg | ORAL_CAPSULE | Freq: Three times a day (TID) | ORAL | Status: DC

## 2017-09-11 MED ORDER — INSULIN GLARGINE 100 UNIT/ML ~~LOC~~ SOLN
30.0000 [IU] | Freq: Every day | SUBCUTANEOUS | Status: DC
  Administered 2017-09-12 – 2017-09-13 (×2): 30 [IU] via SUBCUTANEOUS
  Filled 2017-09-11 (×2): qty 0.3

## 2017-09-11 MED ORDER — INSULIN GLARGINE 100 UNIT/ML ~~LOC~~ SOLN: 36.0000 [IU] | Freq: Every day | SUBCUTANEOUS | Status: DC

## 2017-09-11 MED ORDER — DIVALPROEX SODIUM 500 MG PO DR TAB
500.0000 mg | DELAYED_RELEASE_TABLET | Freq: Three times a day (TID) | ORAL | Status: DC
  Administered 2017-09-11 – 2017-09-25 (×41): 500 mg via ORAL
  Filled 2017-09-11 (×42): qty 1

## 2017-09-11 MED ORDER — INSULIN GLARGINE 100 UNIT/ML ~~LOC~~ SOLN
20.0000 [IU] | SUBCUTANEOUS | Status: DC
  Filled 2017-09-11: qty 0.2

## 2017-09-11 MED ORDER — HALOPERIDOL 5 MG PO TABS
5.0000 mg | ORAL_TABLET | Freq: Two times a day (BID) | ORAL | Status: DC
  Administered 2017-09-11 – 2017-09-26 (×29): 5 mg via ORAL
  Filled 2017-09-11 (×10): qty 1
  Filled 2017-09-11: qty 3
  Filled 2017-09-11 (×2): qty 1
  Filled 2017-09-11: qty 2.5
  Filled 2017-09-11: qty 1
  Filled 2017-09-11: qty 3
  Filled 2017-09-11 (×2): qty 1
  Filled 2017-09-11: qty 2.5
  Filled 2017-09-11 (×7): qty 1
  Filled 2017-09-11: qty 3
  Filled 2017-09-11 (×2): qty 1
  Filled 2017-09-11: qty 3
  Filled 2017-09-11 (×2): qty 1

## 2017-09-11 NOTE — Progress Notes (Signed)
Patient is very defiant and refusing all medications. Patient refuses to eat meals, shower or get out of bed. Dr, Jennet MaduroPucilowska notified due to patient refusing all medications including insulin.

## 2017-09-11 NOTE — [facility] Suicide Risk Assessment (Signed)
[facility] INPATIENT:  Family/Significant Other Suicide Prevention Education  Suicide Prevention Education: Pt declined to involve supports in his treatment. SPE was completed with pt and SPE pamphlet was reviewed. Pt reports not having access to weapons/other means of self harm in the community.   Patient Refusal for Family/Significant Other Suicide Prevention Education: The patient Mark [name] has refused to provide written consent for family/significant other to be provided Family/Significant Other Suicide Prevention Education during admission and/or prior to discharge.  Physician notified.  Heidi DachKelsey Malikah Lakey, LCSW 09/11/2017, 10:22 AM

## 2017-09-11 NOTE — Tx Team (Addendum)
Interdisciplinary Treatment and Diagnostic Plan Update  09/11/2017 Time of Session: 1055 Mark [name] MRN: [id number]  Principal Diagnosis: Severe recurrent major depression without psychotic features [facility])  Secondary Diagnoses: Principal Problem:   Severe recurrent major depression without psychotic features ([place]) Active Problems:   Diabetes ([place])   Current Medications:  Current Facility-Administered Medications  Medication Dose Route Frequency Provider Last Rate Last Dose  . acetaminophen (TYLENOL) tablet 650 mg  650 mg Oral Q6H PRN Clapacs, [name] T, MD      . alum & mag hydroxide-simeth (MAALOX/MYLANTA) 200-200-20 MG/5ML suspension 30 mL  30 mL Oral Q4H PRN Clapacs, [name] T, MD      . feeding supplement (GLUCERNA SHAKE) (GLUCERNA SHAKE) liquid 237 mL  237 mL Oral TID BM Pucilowska, Jolanta B, MD      . Influenza vac split quadrivalent PF (FLUARIX) injection 0.5 mL  0.5 mL Intramuscular Tomorrow-1000 Pucilowska, Jolanta B, MD      . insulin aspart (novoLOG) injection 0-15 Units  0-15 Units Subcutaneous TID WC Clapacs, [name] T, MD      . insulin aspart (novoLOG) injection 0-5 Units  0-5 Units Subcutaneous QHS Clapacs, Madie Reno, MD   2 Units at 09/10/17 2329  . insulin glargine (LANTUS) injection 20 Units  20 Units Subcutaneous STAT Sudini, Alveta Heimlich, MD      . Derrill Memo ON 09/12/2017] insulin glargine (LANTUS) injection 36 Units  36 Units Subcutaneous Daily Sudini, Srikar, MD      . magnesium hydroxide (MILK OF MAGNESIA) suspension 30 mL  30 mL Oral Daily PRN Clapacs, [name] T, MD      . mirtazapine (REMERON) tablet 30 mg  30 mg Oral QHS Clapacs, Madie Reno, MD   30 mg at 09/10/17 2332  . nicotine (NICODERM CQ - dosed in mg/24 hours) patch 21 mg  21 mg Transdermal Daily Pucilowska, Jolanta B, MD      . polyethylene glycol (MIRALAX / GLYCOLAX) packet 17 g  17 g Oral Daily PRN Clapacs, [name] T, MD      . traZODone (DESYREL) tablet 100 mg  100 mg Oral QHS PRN Clapacs, Madie Reno, MD   100 mg at 09/10/17  2333  . venlafaxine XR (EFFEXOR-XR) 24 hr capsule 150 mg  150 mg Oral Q breakfast Clapacs, Madie Reno, MD       PTA Medications: Medications Prior to Admission  Medication Sig Dispense Refill Last Dose  . gabapentin (NEURONTIN) 400 MG capsule Take 3 capsules (1,200 mg total) 3 (three) times daily by mouth. 270 capsule 0 09/09/2017 at 0630  . insulin aspart (NOVOLOG) 100 UNIT/ML injection Inject 0-9 Units 3 (three) times daily with meals into the skin. Sliding Scale CBG 70 - 120: 0 units  CBG 121 - 150: 1 unit  CBG 151 - 200: 2 units  CBG 201 - 250: 3 units  CBG 251 - 300: 5 units  CBG 301 - 350: 7 units  CBG 351 - 400 9 units 10 mL 0 PRN at PRN  . insulin aspart (NOVOLOG) 100 UNIT/ML injection Inject 0-5 Units at bedtime into the skin. Bedtime Sliding Scale  CBG 70 - 120: 0 units  CBG 121 - 150: 0 units  CBG 151 - 200: 0 units  CBG 201 - 250: 2 units  CBG 251 - 300: 3 units  CBG 301 - 350: 4 units  CBG 351 - 400: 5 units (Patient not taking: Reported on 09/09/2017) 10 mL 0 Not Taking at Unknown time  . insulin aspart (  NOVOLOG) 100 UNIT/ML injection Inject 15 Units 3 (three) times daily with meals into the skin. Do not take if you eat less than 50% of you meal (Patient not taking: Reported on 09/09/2017) 13.5 mL 0 Not Taking at Unknown time  . insulin glargine (LANTUS) 100 UNIT/ML injection Inject 0.38 mLs (38 Units total) into the skin every morning. 11.4 mL 0   . mirtazapine (REMERON) 30 MG tablet Take 1 tablet (30 mg total) at bedtime by mouth. 30 tablet 2 09/08/2017 at 2000  . traZODone (DESYREL) 100 MG tablet Take 1 tablet (100 mg total) at bedtime as needed by mouth for sleep. 30 tablet 2 09/08/2017 at 2000  . venlafaxine XR (EFFEXOR-XR) 150 MG 24 hr capsule Take 1 capsule (150 mg total) daily with breakfast by mouth. 30 capsule 2 09/08/2017 at 0800  . zolpidem (AMBIEN) 10 MG tablet Take 1 tablet (10 mg total) at bedtime by mouth. (Patient not taking: Reported on 09/09/2017) 30 tablet 1  Completed Course at Unknown time    Patient Stressors: Health problems Loss of Mother Medication change or noncompliance Substance abuse  Patient Strengths: Average or above average intelligence Capable of independent living  Treatment Modalities: Medication Management, Group therapy, Case management,  1 to 1 session with clinician, Psychoeducation, Recreational therapy.   Physician Treatment Plan for Primary Diagnosis: Severe recurrent major depression without psychotic features ([place]) Long Term Goal(s): Improvement in symptoms so as ready for discharge Improvement in symptoms so as ready for discharge   Short Term Goals: Ability to identify changes in lifestyle to reduce recurrence of condition will improve Ability to verbalize feelings will improve Ability to disclose and discuss suicidal ideas Ability to demonstrate self-control will improve Ability to identify and develop effective coping behaviors will improve Ability to maintain clinical measurements within normal limits will improve Compliance with prescribed medications will improve Ability to identify triggers associated with substance abuse/mental health issues will improve NA  Medication Management: Evaluate patient's response, side effects, and tolerance of medication regimen.  Therapeutic Interventions: 1 to 1 sessions, Unit Group sessions and Medication administration.  Evaluation of Outcomes: Not Met  Physician Treatment Plan for Secondary Diagnosis: Principal Problem:   Severe recurrent major depression without psychotic features ([place]) Active Problems:   Diabetes ([place])  Long Term Goal(s): Improvement in symptoms so as ready for discharge Improvement in symptoms so as ready for discharge   Short Term Goals: Ability to identify changes in lifestyle to reduce recurrence of condition will improve Ability to verbalize feelings will improve Ability to disclose and discuss suicidal ideas Ability to demonstrate  self-control will improve Ability to identify and develop effective coping behaviors will improve Ability to maintain clinical measurements within normal limits will improve Compliance with prescribed medications will improve Ability to identify triggers associated with substance abuse/mental health issues will improve NA     Medication Management: Evaluate patient's response, side effects, and tolerance of medication regimen.  Therapeutic Interventions: 1 to 1 sessions, Unit Group sessions and Medication administration.  Evaluation of Outcomes: Not Met   RN Treatment Plan for Primary Diagnosis: Severe recurrent major depression without psychotic features ([place]) Long Term Goal(s): Knowledge of disease and therapeutic regimen to maintain health will improve  Short Term Goals: Ability to verbalize feelings will improve, Ability to identify and develop effective coping behaviors will improve and Compliance with prescribed medications will improve  Medication Management: RN will administer medications as ordered by provider, will assess and evaluate patient's response and provide education to patient  for prescribed medication. RN will report any adverse and/or side effects to prescribing provider.  Therapeutic Interventions: 1 on 1 counseling sessions, Psychoeducation, Medication administration, Evaluate responses to treatment, Monitor vital signs and CBGs as ordered, Perform/monitor CIWA, COWS, AIMS and Fall Risk screenings as ordered, Perform wound care treatments as ordered.  Evaluation of Outcomes: Not Met   LCSW Treatment Plan for Primary Diagnosis: Severe recurrent major depression without psychotic features ([place]) Long Term Goal(s): Safe transition to appropriate next level of care at discharge, Engage patient in therapeutic group addressing interpersonal concerns.  Short Term Goals: Engage patient in aftercare planning with referrals and resources, Increase social support and Increase  skills for wellness and recovery  Therapeutic Interventions: Assess for all discharge needs, 1 to 1 time with Social worker, Explore available resources and support systems, Assess for adequacy in community support network, Educate family and significant other(s) on suicide prevention, Complete Psychosocial Assessment, Interpersonal group therapy.  Evaluation of Outcomes: Not Met   Progress in Treatment: Attending groups: No. Participating in groups: No. Taking medication as prescribed: Yes. Toleration medication: Yes. Family/Significant other contact made: No, will contact:  pt refused consent Patient understands diagnosis: Yes. Discussing patient identified problems/goals with staff: No. Medical problems stabilized or resolved: Yes. Denies suicidal/homicidal ideation: Yes. Issues/concerns per patient self-inventory: No. Other: none  New problem(s) identified: No, Describe:  none  New Short Term/Long Term Goal(s): Pt refused to attend meeting.  Discharge Plan or Barriers: CSW assessing for appropriate plan.  Reason for Continuation of Hospitalization: Depression Medication stabilization  Estimated Length of Stay: 5-7 days.  Recreational Therapy: Patient Stressors: Family, Relationship, Death, Friends, Work, School Patient Goal: Patient will identify 3 benefits of self-care x7 days.   Attendees: Patient: 09/11/2017   Physician: Dr. Bary Leriche, MD 09/11/2017   Nursing: Polly Cobia, RN 09/11/2017   RN Care Manager: 09/11/2017   Social Worker: Lurline Idol, LCSW 09/11/2017   Recreational Therapist: Roanna Epley, LRT/CTRS 09/11/2017  Other:  09/11/2017   Other:  09/11/2017   Other: 09/11/2017      Scribe for Treatment Team: Joanne Chars, [place] 09/11/2017 1:02 PM

## 2017-09-11 NOTE — Plan of Care (Signed)
Patient slept for Estimated Hours of 4.15; Precautionary checks every 15 minutes for safety maintained, room free of safety hazards, patient sustains no injury or falls during this shift.

## 2017-09-11 NOTE — Progress Notes (Signed)
Recreation Therapy Notes   Date: 11.21.18  Time: 9:30 am  Location: Craft Room  Behavioral response: N/A  Intervention Topic: Leisure  Discussion/Intervention: Patient did not attend group. Clinical Observations/Feedback:  Patient did not attend group. Zerick Prevette LRT/CTRS         Taos Tapp 09/11/2017 11:38 AM

## 2017-09-11 NOTE — Progress Notes (Signed)
D: Patient reports passive SI, denies HI/AVH. Patient presents with agitation, anxiety, depression, helpless/hopeless affect is blunted and depressed during assessment. Patient has no physical complaints at this time and denies pain. Patient states "I am just done man, I just want it to end, I have nothing left."   A: Patient was assessed by this nurse. Patient CBG monitored. Patient contracts for safety verbally. Patient encouraged to report any thoughts of SI/HI/AVH to staff.  Patient received scheduled medications. Q x 15 minute observation checks were completed for safety, patient is on high fall risk precausions. Patient was provided with verbal education on provided medications. Patient care plan was reviewed. Patient was offered support and encouragement. Patient was encourage to attend groups, participate in unit activities and continue with plan of care.   R: Patient adheres with scheduled medication. Patient has no complaints of pain at this time. Patient is receptive to treatment and safety maintained on unit. Patient had an estimated 7.15 hours of restful sleep.

## 2017-09-11 NOTE — Progress Notes (Signed)
Patient ID: Mark [name], male   DOB: 03/15/1986, 31 y.o.   MRN: [id number] Admission Note: BIB EMS after attempted suicide by insulin overdose that led to severe hypoglycemic events requiring ICU admission; UDS+ Cannabinoid. Patient, after medically cleared and stable, declared/threatened by a suicide mission statement to kill himself by stabbing if discharged back to the community. IVC'd, [facility]; he was last discharged by Dr. Johnella MoloneyMcNew in 11//8/18 to his boardinghouse to be f/u with [facility], "I did not follow up ..." Patient rated his depression and anxiety as 8/10, mood and affect irritable and angry. CBG=282, denied SI/SIB/HI/AVH, placed on high falls risk for a recent fall prior to this admission and for unsteady gait, now using assistive device - wheelchair to move about.  Skin assessment and Contraband Search Completed; gross skin intact except for multiple tattoo sites; contraband search completed by Gilman SchmidtSlades, RN, Aurther Lofterry, RN and me. No contraband found on patient and in his belongings. "I have my mom with me, I want to make sure that she is okay.." referring to his mother remains in his belongings, "she is all I got.Marland Kitchen.Marland Kitchen."

## 2017-09-11 NOTE — Progress Notes (Signed)
Chaplain received an OR for a pt in [place] who presented suicidal thoughts. [place] met pt in pt's consult Rm. Pt told [place] that he lost his mother to death, his house, and his job. Pt states he has nothing to live for. Pt further states and I quote, "I am going to kill myself as soon as I live this place." Chaplain asked pt to clarify to him if he had a plan set of how he will kill himself. Pt responded, "I will kill myself," but never presented a precise plan on how he will kill himself. Pt appeared angry, down and uncooperative chaplain's encounter with him. [place] to follow up with pt as needed.    09/11/17 1600  Clinical Encounter Type  Visited With Patient  Visit Type Initial;Psychological support;Other (Comment)  Referral From Nurse  Consult/Referral To Chaplain  Spiritual Encounters  Spiritual Needs Other (Comment)

## 2017-09-11 NOTE — [facility] Counselor (Signed)
CSW attempted to meet with pt to complete psychosocial assessment. Pt was observed laying in bed, and would not respond to CSW when prompted. Pt was observed moving his feet, but would not respond when addressed by CSW. CSW will reattempt meeting with pt to complete PSA.   Heidi DachKelsey Jordi Kamm, MSW, LCSW 09/11/2017 9:17 AM

## 2017-09-11 NOTE — Discharge Summary (Signed)
[facility] - [place] at [facility]   PATIENT NAME: Mark CoopMichael [name]    MR#:  [id number]  DATE OF BIRTH:  03/22/1986  DATE OF ADMISSION:  09/09/2017 ADMITTING PHYSICIAN: Evelena AsaMontell D Salary, MD  DATE OF DISCHARGE: 09/10/2017  9:49 PM  PRIMARY CARE PHYSICIAN: Patient, No Pcp Per   ADMISSION DIAGNOSIS:  Suicidal ideation [R45.851] Dizziness [R42] Hypoglycemia [E16.2] Insulin overdose, intentional self-harm, initial encounter (HCC) [T38.3X2A]  DISCHARGE DIAGNOSIS:  Principal Problem:   Severe recurrent major depression without psychotic features (HCC) Active Problems:   Diabetic keto-acidosis (HCC)   Abdominal pain   Hypoglycemia   SECONDARY DIAGNOSIS:   Past Medical History:  Diagnosis Date  . Diabetes mellitus without complication (HCC)   . Hydrocephalus   . Kidney stones      ADMITTING HISTORY  HISTORY OF PRESENT ILLNESS: Mark CoopMichael [name]  is a 31 y.o. male with a known history of frequent hospital admissions to the emergency room/hospital regarding DKA, admitted today for acute dizziness/lightheadedness earlier this morning after intentional overdose with insulin-patient took 50 units of Lantus, in the emergency room patient was treated appropriately with plans for possible discharge, however patient took insulin which was on his person with-unknown dose, blood sugars dropped to 36, behavioral health/psychiatry to see patient while in house-recommended inpatient admission to psychiatric facility after patient is medically stable, patient placed on D10, patient seen in emergency room, patient states that he wants to die, patient is now being admitted for acute hypoglycemia secondary to intentional suicide attempt with insulin injection, patient's medications were then taken from the patient.    HOSPITAL COURSE:   *Severe hypoglycemia due to intentional overdose of Lantus in a diabetic patient with suicidal ideation. Patient was admitted to ICU after giving D50.   Continued on D5.  Accu-Chek showed blood sugars have improved.  Later his D5 was stopped restarted on lower dose of Lantus.  Patient is doing well.  Psychiatry saw the patient and suggested admission to behavioral health unit where he was transferred.  The  CONSULTS OBTAINED:  Treatment Team:  Pccm, Raymond GurneyArmc-Glidden, MD Shari ProwsPucilowska, Jolanta B, MD Clapacs, Jackquline DenmarkJohn T, MD  DRUG ALLERGIES:   Allergies  Allergen Reactions  . Penicillins Anaphylaxis, Hives and Swelling    Has patient had a PCN reaction causing immediate rash, facial/tongue/throat swelling, SOB or lightheadedness with hypotension: Yes Has patient had a PCN reaction causing severe rash involving mucus membranes or skin necrosis: No Has patient had a PCN reaction that required hospitalization Yes Has patient had a PCN reaction occurring within the last 10 years: Yes If all of the above answers are "NO", then may proceed with Cephalosporin use.  . Sulfa Antibiotics Anaphylaxis and Hives  . Clindamycin/Lincomycin Hives    DISCHARGE MEDICATIONS:   Discharge Medication List as of 09/09/2017  9:46 AM    CONTINUE these medications which have NOT CHANGED   Details  gabapentin (NEURONTIN) 400 MG capsule Take 3 capsules (1,200 mg total) 3 (three) times daily by mouth., Starting Sun 09/08/2017, Print    !! insulin aspart (NOVOLOG) 100 UNIT/ML injection Inject 0-9 Units 3 (three) times daily with meals into the skin. Sliding Scale CBG 70 - 120: 0 units  CBG 121 - 150: 1 unit  CBG 151 - 200: 2 units  CBG 201 - 250: 3 units  CBG 251 - 300: 5 units  CBG 301 - 350: 7 units  CBG 351 - 400 9 units, Sta rting Wed 08/28/2017, Print    mirtazapine (REMERON) 30  MG tablet Take 1 tablet (30 mg total) at bedtime by mouth., Starting Wed 08/28/2017, Print    traZODone (DESYREL) 100 MG tablet Take 1 tablet (100 mg total) at bedtime as needed by mouth for sleep., Starting Wed 08/28/2017, Print    venlafaxine XR (EFFEXOR-XR) 150 MG 24 hr capsule Take 1  capsule (150 mg total) daily with breakfast by mouth., Starting Thu 08/29/2017, Print    insulin glargine (LANTUS) 100 UNIT/ML injection Inject 0.38 mLs (38 Units total) at bedtime into the skin., Starting Wed 08/28/2017, Until Fri 09/27/2017, Print    !! insulin aspart (NOVOLOG) 100 UNIT/ML injection Inject 0-5 Units at bedtime into the skin. Bedtime Sliding Scale  CBG 70 - 120: 0 units  CBG 121 - 150: 0 units  CBG 151 - 200: 0 units  CBG 201 - 250: 2 units  CBG 251 - 300: 3 units  CBG 301 - 350: 4 units  CBG 351 - 400: 5 units, Starting Wed  08/28/2017, Print    !! insulin aspart (NOVOLOG) 100 UNIT/ML injection Inject 15 Units 3 (three) times daily with meals into the skin. Do not take if you eat less than 50% of you meal, Starting Wed 08/28/2017, Until Fri 09/27/2017, Print    zolpidem (AMBIEN) 10 MG tablet Take 1 tablet (10 mg total) at bedtime by mouth., Starting Wed 08/28/2017, Print     !! - Potential duplicate medications found. Please discuss with provider.      Today   VITAL SIGNS:  Blood pressure 125/77, pulse 65, temperature 98.5 F (36.9 C), resp. rate 11, height 6\' 2"  (1.88 m), weight 97.1 kg (214 lb 1.1 oz), SpO2 97 %.  I/O:    Intake/Output Summary (Last 24 hours) at 09/11/2017 1312 Last data filed at 09/10/2017 2137 Gross per 24 hour  Intake 360 ml  Output 1150 ml  Net -790 ml    PHYSICAL EXAMINATION:  Physical Exam  GENERAL:  31 y.o.-year-old patient lying in the bed with no acute distress.  LUNGS: Normal breath sounds bilaterally, no wheezing, rales,rhonchi or crepitation. No use of accessory muscles of respiration.  CARDIOVASCULAR: S1, S2 normal. No murmurs, rubs, or gallops.  ABDOMEN: Soft, non-tender, non-distended. Bowel sounds present. No organomegaly or mass.  NEUROLOGIC: Moves all 4 extremities. PSYCHIATRIC: The patient is alert and oriented x 3.  SKIN: No obvious rash, lesion, or ulcer.   DATA REVIEW:   CBC Recent Labs  Lab 09/10/17 0428   WBC 8.5  HGB 12.6*  HCT 36.8*  PLT 300    Chemistries  Recent Labs  Lab 09/09/17 0732 09/09/17 2012 09/10/17 0428  NA 139  --  140  K 3.4*  --  3.8  CL 104  --  109  CO2 24  --  26  GLUCOSE 49*  --  98  BUN 9  --  6  CREATININE 0.81  --  0.66  CALCIUM 9.1  --  8.4*  MG  --  1.9  --   AST 62*  --   --   ALT 38  --   --   ALKPHOS 102  --   --   BILITOT 0.4  --   --     Cardiac Enzymes No results for input(s): TROPONINI in the last 168 hours.  Microbiology Results  Results for orders placed or performed during the hospital encounter of 09/09/17  MRSA PCR Screening     Status: None   Collection Time: 09/09/17  8:06 PM  Result  Value Ref Range Status   MRSA by PCR NEGATIVE NEGATIVE Final    Comment:        The GeneXpert MRSA Assay (FDA approved for NASAL specimens only), is one component of a comprehensive MRSA colonization surveillance program. It is not intended to diagnose MRSA infection nor to guide or monitor treatment for MRSA infections.     RADIOLOGY:  No results found.  Follow up with PCP in 1 week.  Management plans discussed with the patient, family and they are in agreement.  CODE STATUS:  Code Status History    Date Active Date Inactive Code Status Order ID Comments User Context   09/10/2017 22:30 09/10/2017 22:30 Full Code [id number]  Audery Amel, MD Inpatient   09/09/2017 20:04 09/10/2017 21:53 Full Code [id number]  Bertrum Sol, MD Inpatient   08/22/2017 16:05 08/29/2017 16:13 Full Code [id number]  Haskell Riling, MD Inpatient   08/22/2017 15:18 08/22/2017 16:05 Full Code [id number]  Audery Amel, MD Inpatient   08/22/2017 15:18 08/22/2017 15:18 Full Code [id number]  Audery Amel, MD Inpatient   08/18/2017 17:25 08/22/2017 15:15 Full Code [id number]  Marguarite Arbour, MD Inpatient   11/24/2015 13:56 11/25/2015 18:28 Full Code [id number]  Adrian Saran, MD Inpatient   10/23/2015 10:12 10/24/2015 20:27 Full Code [id number]  Alford Highland, MD ED       TOTAL TIME TAKING CARE OF THIS PATIENT ON DAY OF DISCHARGE: more than 30 minutes.   Molinda Bailiff Konnor Jorden M.D on 09/11/2017 at 1:12 PM  Between 7am to 6pm - Pager - [phone]  After 6pm go to [email or web address] - password [email or web address]  [facility] [place] [facility]  Office  [phone]  CC: Primary care physician; Patient, No Pcp Per  Note: This dictation was prepared with Dragon dictation along with smaller phrase technology. Any transcriptional errors that result from this process are unintentional.

## 2017-09-11 NOTE — Progress Notes (Addendum)
Patient known from ICU admissions.  Received 15 units lantus earlier at 12 PM Will increase daily lantus to 30 units from tomorrow. He takes 45 units at home( not sure if this is true). Give 10 units lantus now. Continue SSI.  Full consult to follow

## 2017-09-11 NOTE — Plan of Care (Signed)
  Progressing Activity: Sleeping patterns will improve 09/11/2017 2018 - Progressing by Berkley Harveyeynolds, Zayvian Mcmurtry I, RN Education: Ability to make informed decisions regarding treatment will improve 09/11/2017 2018 - Progressing by Addison Naegelieynolds, Nastasia Kage I, RN Self-Concept: Ability to verbalize positive feelings about self will improve 09/11/2017 2018 - Progressing by Berkley Harveyeynolds, Pranathi Winfree I, RN

## 2017-09-11 NOTE — Consult Note (Addendum)
[employer] - [place] at [facility]   PATIENT NAME: Mark [name]    MR#:  [id number]  DATE OF BIRTH:  03-14-1986  DATE OF ADMISSION:  09/10/2017  PRIMARY CARE PHYSICIAN: Patient, No Pcp Per   CONSULT REQUESTING/REFERRING PHYSICIAN: Dr. Fredia Beets  REASON FOR CONSULT: Uncontrolled diabetes  CHIEF COMPLAINT:  No chief complaint on file.   HISTORY OF PRESENT ILLNESS:  Mark [name]  is a 31 y.o. male with a known history of diabetes mellitus, noncompliance, bipolar disorder admitted to behavioral health unit with suicidal attempt with insulin overdose.  Patient known from recent ICU admission.  Patient unable to provide exact dose of his Levemir at home.  He has told me waiting fingers of 38 units, 45 units, 50 units.  Today morning he refused to take the 15 units ordered.  Later he agreed to take this at lunch.  Patient was admitted to ICU due to insulin overdose with hypoglycemia.  He has been refusing to eat or take his medications today. Blood sugars uncontrolled at greater than 400.  PAST MEDICAL HISTORY:   Past Medical History:  Diagnosis Date  . Diabetes mellitus without complication (HCC)   . Hydrocephalus   . Kidney stones     PAST SURGICAL HISTOIRY:   Past Surgical History:  Procedure Laterality Date  . KIDNEY STONE SURGERY      SOCIAL HISTORY:   Social History   Tobacco Use  . Smoking status: Current Every Day Smoker    Packs/day: 1.00    Types: Cigarettes  . Smokeless tobacco: Never Used  Substance Use Topics  . Alcohol use: No    Comment: ocasional     FAMILY HISTORY:   Family History  Problem Relation Age of Onset  . Breast cancer Mother     DRUG ALLERGIES:   Allergies  Allergen Reactions  . Penicillins Anaphylaxis, Hives and Swelling    Has patient had a PCN reaction causing immediate rash, facial/tongue/throat swelling, SOB or lightheadedness with hypotension: Yes Has patient had a PCN reaction causing severe rash  involving mucus membranes or skin necrosis: No Has patient had a PCN reaction that required hospitalization Yes Has patient had a PCN reaction occurring within the last 10 years: Yes If all of the above answers are "NO", then may proceed with Cephalosporin use.  . Sulfa Antibiotics Anaphylaxis and Hives  . Clindamycin/Lincomycin Hives    REVIEW OF SYSTEMS:   ROS  CONSTITUTIONAL: No fever, fatigue or weakness.  EYES: No blurred or double vision.  EARS, NOSE, AND THROAT: No tinnitus or ear pain.  RESPIRATORY: No cough, shortness of breath, wheezing or hemoptysis.  CARDIOVASCULAR: No chest pain, orthopnea, edema.  GASTROINTESTINAL: No nausea, vomiting, diarrhea or abdominal pain.  GENITOURINARY: No dysuria, hematuria.  ENDOCRINE: No polyuria, nocturia,  HEMATOLOGY: No anemia, easy bruising or bleeding SKIN: No rash or lesion. MUSCULOSKELETAL: No joint pain or arthritis.   NEUROLOGIC: No tingling, numbness, weakness.  PSYCHIATRY: No anxiety or depression.   MEDICATIONS AT HOME:   Prior to Admission medications   Medication Sig Start Date End Date Taking? Authorizing Provider  gabapentin (NEURONTIN) 400 MG capsule Take 3 capsules (1,200 mg total) 3 (three) times daily by mouth. 09/08/17   Sharman Cheek, MD  insulin aspart (NOVOLOG) 100 UNIT/ML injection Inject 0-9 Units 3 (three) times daily with meals into the skin. Sliding Scale CBG 70 - 120: 0 units  CBG 121 - 150: 1 unit  CBG 151 - 200: 2 units  CBG 201 -  250: 3 units  CBG 251 - 300: 5 units  CBG 301 - 350: 7 units  CBG 351 - 400 9 units 08/28/17   McNew, Ileene HutchinsonHolly R, MD  insulin aspart (NOVOLOG) 100 UNIT/ML injection Inject 0-5 Units at bedtime into the skin. Bedtime Sliding Scale  CBG 70 - 120: 0 units  CBG 121 - 150: 0 units  CBG 151 - 200: 0 units  CBG 201 - 250: 2 units  CBG 251 - 300: 3 units  CBG 301 - 350: 4 units  CBG 351 - 400: 5 units Patient not taking: Reported on 09/09/2017 08/28/17   McNew, Ileene HutchinsonHolly R, MD   insulin aspart (NOVOLOG) 100 UNIT/ML injection Inject 15 Units 3 (three) times daily with meals into the skin. Do not take if you eat less than 50% of you meal Patient not taking: Reported on 09/09/2017 08/28/17 09/27/17  Haskell RilingMcNew, Holly R, MD  insulin glargine (LANTUS) 100 UNIT/ML injection Inject 0.38 mLs (38 Units total) into the skin every morning. 09/10/17 10/10/17  Milagros LollSudini, Mariah Harn, MD  mirtazapine (REMERON) 30 MG tablet Take 1 tablet (30 mg total) at bedtime by mouth. 08/28/17   McNew, Ileene HutchinsonHolly R, MD  traZODone (DESYREL) 100 MG tablet Take 1 tablet (100 mg total) at bedtime as needed by mouth for sleep. 08/28/17   McNew, Ileene HutchinsonHolly R, MD  venlafaxine XR (EFFEXOR-XR) 150 MG 24 hr capsule Take 1 capsule (150 mg total) daily with breakfast by mouth. 08/29/17   McNew, Ileene HutchinsonHolly R, MD  zolpidem (AMBIEN) 10 MG tablet Take 1 tablet (10 mg total) at bedtime by mouth. Patient not taking: Reported on 09/09/2017 08/28/17   Haskell RilingMcNew, Holly R, MD      VITAL SIGNS:  Blood pressure 126/83, pulse 88, temperature 98.4 F (36.9 C), temperature source Oral, resp. rate 18, height 6\' 2"  (1.88 m), weight 97.1 kg (214 lb), SpO2 99 %.  PHYSICAL EXAMINATION:  GENERAL:  31 y.o.-year-old patient lying in the bed with no acute distress.  EYES: Pupils equal, round, reactive to light and accommodation. No scleral icterus. Extraocular muscles intact.  HEENT: Head atraumatic, normocephalic. Oropharynx and nasopharynx clear.  NECK:  Supple, no jugular venous distention. No thyroid enlargement, no tenderness.  LUNGS: Normal breath sounds bilaterally, no wheezing, rales,rhonchi or crepitation. No use of accessory muscles of respiration.  CARDIOVASCULAR: S1, S2 normal. No murmurs, rubs, or gallops.  ABDOMEN: Soft, nontender, nondistended. Bowel sounds present. No organomegaly or mass.  EXTREMITIES: No pedal edema, cyanosis, or clubbing.  NEUROLOGIC: Cranial nerves II through XII are intact. Muscle strength 5/5 in all extremities. Sensation  intact. Gait not checked.  PSYCHIATRIC: The patient is alert and oriented x 3. Flat affect SKIN: No obvious rash, lesion, or ulcer.   LABORATORY PANEL:   CBC Recent Labs  Lab 09/10/17 0428  WBC 8.5  HGB 12.6*  HCT 36.8*  PLT 300   ------------------------------------------------------------------------------------------------------------------  Chemistries  Recent Labs  Lab 09/09/17 0732 09/09/17 2012 09/10/17 0428  NA 139  --  140  K 3.4*  --  3.8  CL 104  --  109  CO2 24  --  26  GLUCOSE 49*  --  98  BUN 9  --  6  CREATININE 0.81  --  0.66  CALCIUM 9.1  --  8.4*  MG  --  1.9  --   AST 62*  --   --   ALT 38  --   --   ALKPHOS 102  --   --  BILITOT 0.4  --   --    ------------------------------------------------------------------------------------------------------------------  Cardiac Enzymes No results for input(s): TROPONINI in the last 168 hours. ------------------------------------------------------------------------------------------------------------------  RADIOLOGY:  No results found.  EKG:   Orders placed or performed during the hospital encounter of 09/09/17  . ED EKG  . ED EKG  . EKG 12-Lead  . EKG 12-Lead    IMPRESSION AND PLAN:   *Uncontrolled diabetes mellitus with hyperglycemia.  Will give additional dose of 10 units Lantus to 15 units he had received earlier.  Continue sliding scale insulin.  The patient refusing to eat would be at high risk for hypoglycemia.  Will increase Lantus  tomorrow if blood sugars remain uncontrolled.  Would titrate up slowly as his home dose is unknown.  Blood sugars will likely remain in control over the next 2 days.  Counseled patient regarding being compliant with insulin and diet.  * Severe depression with suicidal ideation.  Management as per psychiatry.*    All the records are reviewed and case discussed with Consulting provider. Management plans discussed with the patient and they are in  agreement.  TOTAL TIME TAKING CARE OF THIS PATIENT: 40 minutes.    Orie FishermanSrikar R Arian Murley M.D on 09/11/2017 at 6:19 PM  Between 7am to 6pm - Pager - [phone]  After 6pm go to [email or web address] - password [email or web address] [facility]  [facility] [place] [facility]  Office  [phone]  CC: Primary care Physician: Patient, No Pcp Per     Note: This dictation was prepared with Dragon dictation along with smaller phrase technology. Any transcriptional errors that result from this process are unintentional.

## 2017-09-11 NOTE — Progress Notes (Signed)
This morning patient refused to take all medications. Dr. Jennet MaduroPucilowska was notified. Dr. Jennet MaduroPucilowska contacted the hospitalist. Dr. Elpidio AnisSudini rounded on the patient. Patient is now compliant with medications. Blood glucose 488 at 16:04. RN called Dr. Elpidio AnisSudini who informed this RN to give 20 units of novolog. Patient received 20 units of novolog before eating dinner. Patient now more active in the Dayroom with other peers. Patient denies SI, HI and AVH. Patient has received 25 Units of Lantus on first shift.

## 2017-09-11 NOTE — Progress Notes (Signed)
Recreation Therapy Notes   Date: 11.21.2018  Time: 3:00pm  Location: Craft room  Behavioral response: Appropriate, Restless  Group Type: Craft  Participation level: Moderate  Communication: Patient was social with peers and staff.  Comments: N/A  Jacari Kirsten LRT/CTRS        Wilberta Dorvil 09/11/2017 4:24 PM

## 2017-09-11 NOTE — H&P (Signed)
Psychiatric Admission Assessment Adult  Patient Identification: Mark [name] MRN:  [id number] Date of Evaluation:  09/11/2017 Chief Complaint:  Major Deprission Principal Diagnosis: Severe recurrent major depression without psychotic features [facility]) Diagnosis:   Patient Active Problem List   Diagnosis Date Noted  . Diabetes (HCC) [E11.9] 09/11/2017  . Hypoglycemia [E16.2] 09/09/2017  . MDD (major depressive disorder) [F32.9] 08/22/2017  . Severe recurrent major depression without psychotic features (HCC) [F33.2] 08/19/2017  . Nausea & vomiting [R11.2] 08/18/2017  . Abdominal pain [R10.9] 08/18/2017  . Hyponatremia [E87.1] 08/18/2017  . DKA (diabetic ketoacidoses) (HCC) [E13.10] 11/24/2015  . Diabetic keto-acidosis (HCC) [E13.10] 10/23/2015   History of Present Illness:   Identifying data. Mark [name] is a 31 year old male with a history of depression.  Chief complaint. "I did not take my medications."  History of present illness. Information was obtained from the patient and the chart. The patient was transferred from ICU where he was admitted for hypoglycemia. We suspected that he overdosed on Insulin but the patient adamantly and angrily denies stating tha to the contrary, he has not been taking medivations. He can not explain how he ended up in the hospital "someone called". He was marginally cooperative on medical floor but since transfer to psychiatry, he refuses food, drink or any medications. He is irritable and uncooperative. He "wants to die". He understands that we will treat him in the hospital even against his will and comments "you got to do what you got to do". He warms up a little as we go on. He reports all symptoms of depression with poor sleep, decreased appetite, anhedonia, feeling of hopelessness and helplessness, poor energy and concentration, crying spells and suicidal ideation. He faces multiple severe stressors. He was recently discharged from [facility] to his boarding  house but lost this housing three days later unable to pay the rent. Hd has been staying at the homeless shelter. He has not been compliant with treatment. He denies psychotic symptoms or symptoms suggestive of bipolar mania. Reports severe anxiety. No substance use.  Past psychiatric history. He was diagnosed with bipolar disorder at the age of 15. There were multiple hospitalizations and medication trial. There is history of treatment noncompliance. There were several suicide attempts including by cutting.   Family psychiatric history. None reported.  Social history. 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 He has no income, insurance, housing or social support. His siter who has been somewhat helpful suffers health problems herself. The patient lost his mother recently. He carries her ashes with him and was making sure that the urn was in his possession on admission.   Total Time spent with patient: 1 hour  Is the patient at risk to self? Yes.    Has the patient been a risk to self in the past 6 months? Yes.    Has the patient been a risk to self within the distant past? Yes.    Is the patient a risk to others? No.  Has the patient been a risk to others in the past 6 months? No.  Has the patient been a risk to others within the distant past? No.   Prior Inpatient Therapy:   Prior Outpatient Therapy:    Alcohol Screening: 1. How often do you have a drink containing alcohol?: Never 2. How many drinks containing alcohol do you have on a typical day when you are drinking?: 1 or 2 3. How often do you have six or  more drinks on one occasion?: Never AUDIT-C Score: 0 4. How often during the last year have you found that you were not able to stop drinking once you had started?: Never 5. How often during the last year have you failed to do what was normally expected from you becasue of drinking?: Never 6. How often during the last year have you needed a first  drink in the morning to get yourself going after a heavy drinking session?: Never 7. How often during the last year have you had a feeling of guilt of remorse after drinking?: Never 8. How often during the last year have you been unable to remember what happened the night before because you had been drinking?: Never 9. Have you or someone else been injured as a result of your drinking?: No 10. Has a relative or friend or a doctor or another health worker been concerned about your drinking or suggested you cut down?: No Alcohol Use Disorder Identification Test Final Score (AUDIT): 0 Intervention/Follow-up: Brief Advice Substance Abuse History in the last 12 months:  No. Consequences of Substance Abuse: NA Previous Psychotropic Medications: Yes  Psychological Evaluations: No  Past Medical History:  Past Medical History:  Diagnosis Date  . Diabetes mellitus without complication (HCC)   . Hydrocephalus   . Kidney stones     Past Surgical History:  Procedure Laterality Date  . KIDNEY STONE SURGERY     Family History:  Family History  Problem Relation Age of Onset  . Breast cancer Mother     Tobacco Screening: Have you used any form of tobacco in the last 30 days? (Cigarettes, Smokeless Tobacco, Cigars, and/or Pipes): Yes Tobacco use, Select all that apply: 5 or more cigarettes per day Are you interested in Tobacco Cessation Medications?: Yes, will notify MD for an order(Nicotine 21 mg recommended) Counseled patient on smoking cessation including recognizing danger situations, developing coping skills and basic information about quitting provided: Yes Social History:  Social History   Substance and Sexual Activity  Alcohol Use No   Comment: ocasional      Social History   Substance and Sexual Activity  Drug Use Yes  . Types: Marijuana   Comment: "as often as I can"    Additional Social History:                           Allergies:   Allergies  Allergen  Reactions  . Penicillins Anaphylaxis, Hives and Swelling    Has patient had a PCN reaction causing immediate rash, facial/tongue/throat swelling, SOB or lightheadedness with hypotension: Yes Has patient had a PCN reaction causing severe rash involving mucus membranes or skin necrosis: No Has patient had a PCN reaction that required hospitalization Yes Has patient had a PCN reaction occurring within the last 10 years: Yes If all of the above answers are "NO", then may proceed with Cephalosporin use.  . Sulfa Antibiotics Anaphylaxis and Hives  . Clindamycin/Lincomycin Hives   Lab Results:  Results for orders placed or performed during the hospital encounter of 09/10/17 (from the past 48 hour(s))  Glucose, capillary     Status: Abnormal   Collection Time: 09/10/17 10:29 PM  Result Value Ref Range   Glucose-Capillary 282 (H) 65 - 99 mg/dL   Comment 1 Notify RN   Lipid panel     Status: Abnormal   Collection Time: 09/11/17  7:32 AM  Result Value Ref Range   Cholesterol 217 (H) 0 -  200 mg/dL   Triglycerides 962 <952 mg/dL   HDL 49 >84 mg/dL   Total CHOL/HDL Ratio 4.4 RATIO   VLDL 27 0 - 40 mg/dL   LDL Cholesterol 132 (H) 0 - 99 mg/dL    Comment:        Total Cholesterol/HDL:CHD Risk Coronary Heart Disease Risk Table                     Men   Women  1/2 Average Risk   3.4   3.3  Average Risk       5.0   4.4  2 X Average Risk   9.6   7.1  3 X Average Risk  23.4   11.0        Use the calculated Patient Ratio above and the CHD Risk Table to determine the patient's CHD Risk.        ATP III CLASSIFICATION (LDL):  <100     mg/dL   Optimal  440-102  mg/dL   Near or Above                    Optimal  130-159  mg/dL   Borderline  725-366  mg/dL   High  >440     mg/dL   Very High   TSH     Status: Abnormal   Collection Time: 09/11/17  7:32 AM  Result Value Ref Range   TSH 11.747 (H) 0.350 - 4.500 uIU/mL    Comment: Performed by a 3rd Generation assay with a functional sensitivity of  <=0.01 uIU/mL.  Glucose, capillary     Status: Abnormal   Collection Time: 09/11/17  7:37 AM  Result Value Ref Range   Glucose-Capillary 228 (H) 65 - 99 mg/dL   Comment 1 Notify RN     Blood Alcohol level:  Lab Results  Component Value Date   ETH <10 09/08/2017   ETH <10 08/18/2017    Metabolic Disorder Labs:  Lab Results  Component Value Date   HGBA1C 9.2 (H) 11/25/2015   No results found for: PROLACTIN Lab Results  Component Value Date   CHOL 217 (H) 09/11/2017   TRIG 135 09/11/2017   HDL 49 09/11/2017   CHOLHDL 4.4 09/11/2017   VLDL 27 09/11/2017   LDLCALC 141 (H) 09/11/2017   LDLCALC 216 (H) 08/23/2017    Current Medications: Current Facility-Administered Medications  Medication Dose Route Frequency Provider Last Rate Last Dose  . acetaminophen (TYLENOL) tablet 650 mg  650 mg Oral Q6H PRN Clapacs, [name] T, MD      . alum & mag hydroxide-simeth (MAALOX/MYLANTA) 200-200-20 MG/5ML suspension 30 mL  30 mL Oral Q4H PRN Clapacs, [name] T, MD      . feeding supplement (GLUCERNA SHAKE) (GLUCERNA SHAKE) liquid 237 mL  237 mL Oral TID BM Carmelo Reidel B, MD      . Influenza vac split quadrivalent PF (FLUARIX) injection 0.5 mL  0.5 mL Intramuscular Tomorrow-1000 Raijon Lindfors B, MD      . insulin aspart (novoLOG) injection 0-15 Units  0-15 Units Subcutaneous TID WC Clapacs, [name] T, MD      . insulin aspart (novoLOG) injection 0-5 Units  0-5 Units Subcutaneous QHS Clapacs, Jackquline Denmark, MD   2 Units at 09/10/17 2329  . insulin glargine (LANTUS) injection 20 Units  20 Units Subcutaneous STAT Sudini, Wardell Heath, MD      . Melene Muller ON 09/12/2017] insulin glargine (LANTUS) injection 36 Units  36 Units Subcutaneous  Daily Sudini, Forensic scientistrikar, MD      . magnesium hydroxide (MILK OF MAGNESIA) suspension 30 mL  30 mL Oral Daily PRN Clapacs, [name] T, MD      . mirtazapine (REMERON) tablet 30 mg  30 mg Oral QHS Clapacs, Jackquline DenmarkJohn T, MD   30 mg at 09/10/17 2332  . nicotine (NICODERM CQ - dosed in mg/24  hours) patch 21 mg  21 mg Transdermal Daily Ceirra Belli B, MD      . polyethylene glycol (MIRALAX / GLYCOLAX) packet 17 g  17 g Oral Daily PRN Clapacs, [name] T, MD      . traZODone (DESYREL) tablet 100 mg  100 mg Oral QHS PRN Clapacs, Jackquline DenmarkJohn T, MD   100 mg at 09/10/17 2333  . venlafaxine XR (EFFEXOR-XR) 24 hr capsule 150 mg  150 mg Oral Q breakfast Clapacs, Jackquline DenmarkJohn T, MD       PTA Medications: Medications Prior to Admission  Medication Sig Dispense Refill Last Dose  . gabapentin (NEURONTIN) 400 MG capsule Take 3 capsules (1,200 mg total) 3 (three) times daily by mouth. 270 capsule 0 09/09/2017 at 0630  . insulin aspart (NOVOLOG) 100 UNIT/ML injection Inject 0-9 Units 3 (three) times daily with meals into the skin. Sliding Scale CBG 70 - 120: 0 units  CBG 121 - 150: 1 unit  CBG 151 - 200: 2 units  CBG 201 - 250: 3 units  CBG 251 - 300: 5 units  CBG 301 - 350: 7 units  CBG 351 - 400 9 units 10 mL 0 PRN at PRN  . insulin aspart (NOVOLOG) 100 UNIT/ML injection Inject 0-5 Units at bedtime into the skin. Bedtime Sliding Scale  CBG 70 - 120: 0 units  CBG 121 - 150: 0 units  CBG 151 - 200: 0 units  CBG 201 - 250: 2 units  CBG 251 - 300: 3 units  CBG 301 - 350: 4 units  CBG 351 - 400: 5 units (Patient not taking: Reported on 09/09/2017) 10 mL 0 Not Taking at Unknown time  . insulin aspart (NOVOLOG) 100 UNIT/ML injection Inject 15 Units 3 (three) times daily with meals into the skin. Do not take if you eat less than 50% of you meal (Patient not taking: Reported on 09/09/2017) 13.5 mL 0 Not Taking at Unknown time  . insulin glargine (LANTUS) 100 UNIT/ML injection Inject 0.38 mLs (38 Units total) into the skin every morning. 11.4 mL 0   . mirtazapine (REMERON) 30 MG tablet Take 1 tablet (30 mg total) at bedtime by mouth. 30 tablet 2 09/08/2017 at 2000  . traZODone (DESYREL) 100 MG tablet Take 1 tablet (100 mg total) at bedtime as needed by mouth for sleep. 30 tablet 2 09/08/2017 at 2000  .  venlafaxine XR (EFFEXOR-XR) 150 MG 24 hr capsule Take 1 capsule (150 mg total) daily with breakfast by mouth. 30 capsule 2 09/08/2017 at 0800  . zolpidem (AMBIEN) 10 MG tablet Take 1 tablet (10 mg total) at bedtime by mouth. (Patient not taking: Reported on 09/09/2017) 30 tablet 1 Completed Course at Unknown time    Musculoskeletal: Strength & Muscle Tone: within normal limits Gait & Station: normal Patient leans: N/A  Psychiatric Specialty Exam: I reviewed physical exam performed on medical floor and agree with the findings. Physical Exam  Nursing note and vitals reviewed. Psychiatric: His affect is angry. His speech is rapid and/or pressured. He is withdrawn. Cognition and memory are impaired. He expresses impulsivity. He expresses suicidal ideation. He  expresses suicidal plans.    Review of Systems  Constitutional: Positive for malaise/fatigue and weight loss.  Neurological: Negative.   Psychiatric/Behavioral: Positive for depression and suicidal ideas.  All other systems reviewed and are negative.   Blood pressure 126/83, pulse 88, temperature 98.4 F (36.9 C), temperature source Oral, resp. rate 18, height 6\' 2"  (1.88 m), weight 97.1 kg (214 lb), SpO2 99 %.Body mass index is 27.48 kg/m.  See SRA                                                  Sleep:  Number of Hours: 4.15    Treatment Plan Summary: Daily contact with patient to assess and evaluate symptoms and progress in treatment and Medication management   Mr. Susman is a 31 year old male with a history of depression,, type 1 diabetes and ttreatment noncompliance transferred from ICU where he was hospitalized for severe hypoglycemia from presumed insulin overdose.  #Suicidal ideation -the patient wants to die following discharge -able to contract for safety in the hospital  #Mood -restart Effexor 150 mg daily -restart Remeron 30 mg nightly  #Diabetes -Lantus 15 units daily, ADA diet, SSI,  blood glucose monitoring -refused food and insulin today -blood glucose 451at 12:00 -offer Glucerna -medicine consult is greatly appreciated  #Insomnia -Trazodone 100 mg nightly  #Social -patient has no income, health insurance, housing or support  #Disposition -TBE    Observation Level/Precautions:  15 minute checks  Laboratory:  CBC Chemistry Profile UDS UA  Psychotherapy:    Medications:    Consultations:    Discharge Concerns:    Estimated LOS:  Other:     Physician Treatment Plan for Primary Diagnosis: Severe recurrent major depression without psychotic features (HCC) Long Term Goal(s): Improvement in symptoms so as ready for discharge  Short Term Goals: Ability to identify changes in lifestyle to reduce recurrence of condition will improve, Ability to verbalize feelings will improve, Ability to disclose and discuss suicidal ideas, Ability to demonstrate self-control will improve, Ability to identify and develop effective coping behaviors will improve, Ability to maintain clinical measurements within normal limits will improve, Compliance with prescribed medications will improve and Ability to identify triggers associated with substance abuse/mental health issues will improve  Physician Treatment Plan for Secondary Diagnosis: Principal Problem:   Severe recurrent major depression without psychotic features (HCC) Active Problems:   Diabetes (HCC)  Long Term Goal(s): Improvement in symptoms so as ready for discharge  Short Term Goals: NA  I certify that inpatient services furnished can reasonably be expected to improve the patient's condition.    Kristine Linea, MD 11/21/201812:27 PM

## 2017-09-11 NOTE — [facility] Suicide Risk Assessment (Signed)
[facility]   Nursing information obtained from:    Demographic factors:    Current Mental Status:    Loss Factors:    Historical Factors:    Risk Reduction Factors:     Total Time spent with patient: 1 hour Principal Problem: Severe recurrent major depression without psychotic features [facility](HCC) Diagnosis:   Patient Active Problem List   Diagnosis Date Noted  . Diabetes (HCC) [E11.9] 09/11/2017  . Hypoglycemia [E16.2] 09/09/2017  . MDD (major depressive disorder) [F32.9] 08/22/2017  . Severe recurrent major depression without psychotic features (HCC) [F33.2] 08/19/2017  . Nausea & vomiting [R11.2] 08/18/2017  . Abdominal pain [R10.9] 08/18/2017  . Hyponatremia [E87.1] 08/18/2017  . DKA (diabetic ketoacidoses) (HCC) [E13.10] 11/24/2015  . Diabetic keto-acidosis (HCC) [E13.10] 10/23/2015   Subjective Data: overdose  Continued Clinical Symptoms:  Alcohol Use Disorder Identification Test Final Score (AUDIT): 0 The "Alcohol Use Disorders Identification Test", Guidelines for Use in Primary Care, Second Edition.  World Science writerHealth Organization [facility](WHO). Score between 0-7:  no or low risk or alcohol related problems. Score between 8-15:  moderate risk of alcohol related problems. Score between 16-19:  high risk of alcohol related problems. Score 20 or above:  warrants further diagnostic evaluation for alcohol dependence and treatment.   CLINICAL FACTORS:   Depression:   Hopelessness Impulsivity Severe Previous Psychiatric Diagnoses and Treatments Medical Diagnoses and Treatments/Surgeries   Musculoskeletal: Strength & Muscle Tone: within normal limits Gait & Station: normal Patient leans: N/A  Psychiatric Specialty Exam: Physical Exam  Nursing note and vitals reviewed. Psychiatric: His affect is angry and labile. His speech is rapid and/or pressured. He is withdrawn. Cognition and memory are impaired. He expresses impulsivity. He expresses suicidal ideation.  He expresses suicidal plans.    Review of Systems  Constitutional: Positive for malaise/fatigue and weight loss.  Neurological: Negative.   Psychiatric/Behavioral: Positive for depression and suicidal ideas. The patient is nervous/anxious.   All other systems reviewed and are negative.   Blood pressure 126/83, pulse 88, temperature 98.4 F (36.9 C), temperature source Oral, resp. rate 18, height 6\' 2"  (1.88 m), weight 97.1 kg (214 lb), SpO2 99 %.Body mass index is 27.48 kg/m.  General Appearance: Disheveled  Eye Contact:  None  Speech:  Pressured  Volume:  Increased  Mood:  Angry, Dysphoric and Irritable  Affect:  Congruent  Thought Process:  Goal Directed and Descriptions of Associations: Intact  Orientation:  Full (Time, Place, and Person)  Thought Content:  WDL  Suicidal Thoughts:  Yes.  with intent/plan  Homicidal Thoughts:  No  Memory:  Immediate;   Fair Recent;   Fair Remote;   Fair  Judgement:  Poor  Insight:  Lacking  Psychomotor Activity:  Psychomotor Retardation  Concentration:  Concentration: Fair and Attention Span: Fair  Recall:  FiservFair  Fund of Knowledge:  Fair  Language:  Fair  Akathisia:  No  Handed:  Right  AIMS (if indicated):     Assets:  Communication Skills  ADL's:  Impaired  Cognition:  Impaired,  Mild  Sleep:  Number of Hours: 4.15      COGNITIVE FEATURES THAT CONTRIBUTE TO RISK:  None    SUICIDE RISK:   Extreme:  Frequent, intense, and enduring suicidal ideation, specific plans, clear subjective and objective intent, impaired self-control, severe dysphoria/symptomatology, many risk factors and no protective factors.  PLAN OF CARE: hospital admission, medication management, discharge planning.  Mark [name] is a 31 year old male with a history of depression,, type  1 diabetes and ttreatment noncompliance transferred from ICU where he was hospitalized for severe hypoglycemia from presumed insulin overdose.  #Suicidal ideation -the patient wants  to die following discharge -able to contract for safety in the hospital  #Mood -restart Effexor 150 mg daily -restart Remeron 30 mg nightly  #Diabetes -Lantus 15 units daily, ADA diet, SSI, blood glucose monitoring -refused food and insulin today -blood glucose 451at 12:00 -medicine consult is greatly appreciated  #Insomnia -Trazodone 100 mg nightly  #Social -patient has no income, health insurance, housing or support  #Disposition -TBE   I certify that inpatient services furnished can reasonably be expected to improve the patient's condition.   Kristine LineaJolanta Loribeth Katich, MD 09/11/2017, 12:18 PM
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
## 2017-09-12 DIAGNOSIS — F332 Major depressive disorder, recurrent severe without psychotic features: Secondary | ICD-10-CM

## 2017-09-12 LAB — GLUCOSE, CAPILLARY
GLUCOSE-CAPILLARY: 415 mg/dL — AB (ref 65–99)
GLUCOSE-CAPILLARY: 451 mg/dL — AB (ref 65–99)
Glucose-Capillary: 322 mg/dL — ABNORMAL HIGH (ref 65–99)
Glucose-Capillary: 266 mg/dL — ABNORMAL HIGH (ref 65–99)
Glucose-Capillary: 142 mg/dL — ABNORMAL HIGH (ref 65–99)

## 2017-09-12 LAB — GLUCOSE, RANDOM: Glucose, Bld: 427 mg/dL — ABNORMAL HIGH (ref 65–99)

## 2017-09-12 MED ORDER — HYDROXYZINE HCL 25 MG PO TABS
25.0000 mg | ORAL_TABLET | ORAL | Status: DC | PRN
  Administered 2017-09-12 – 2017-09-25 (×15): 25 mg via ORAL
  Filled 2017-09-12 (×15): qty 1

## 2017-09-12 NOTE — Progress Notes (Signed)
Patient up ad lib with steady gait, A&O x 3 with steady gait. Compliant with medication and meals. Present in the milieu with appropriate but selective interaction with his peers. Complaints of anxiety to nurse, MD notified of such with new medication ordered. BS will be monitored with coverage provided if needed. Denies SI/HI/AVH. Milieu remains safe with q 15 minute safety checks. 

## 2017-09-12 NOTE — [facility] Group Notes (Signed)
[facility] Group Notes:  (Nursing/MHT/Case Management/Adjunct)  Date:  09/12/2017  Time:  1:22 AM  Type of Therapy:  Group Therapy  Participation Level:  Did Not Attend  Participation Quality: Summary of Progress/Problems:  Mark [name] 09/12/2017, 1:22 AM

## 2017-09-12 NOTE — Progress Notes (Signed)
[facility] MD Progress Note  09/12/2017 3:22 PM Mark [name]  MRN:  [id number] Subjective: Patient states that he feels a little better, but it is a slow process.  He also takes him a while to comprehend stuff.  Endorses that he would kill himself when he gets discharged from the hospital because he has nothing to live for.  Has lost his job, lost his mom and lost his house.  Endorses that he has been through a unique combination of psychosocial circumstances which leaves him nothing to look forward to.  He tells me that he is on the edge all the time and would be thankful if I could put him on Vistaril.  Since sleep has been difficult.  And also sadness of mood, hopelessness and inability to enjoy any activity.  Tells me he does not have a plan but will kill himself when he gets out of here. Principal Problem: Severe recurrent major depression without psychotic features (HCC) Diagnosis:   Patient Active Problem List   Diagnosis Date Noted  . Diabetes (HCC) [E11.9] 09/11/2017  . Hypoglycemia [E16.2] 09/09/2017  . MDD (major depressive disorder) [F32.9] 08/22/2017  . Severe recurrent major depression without psychotic features (HCC) [F33.2] 08/19/2017  . Nausea & vomiting [R11.2] 08/18/2017  . Abdominal pain [R10.9] 08/18/2017  . Hyponatremia [E87.1] 08/18/2017  . DKA (diabetic ketoacidoses) (HCC) [E13.10] 11/24/2015  . Diabetic keto-acidosis (HCC) [E13.10] 10/23/2015   Total Time spent with patient: 30 minutes  Past Psychiatric History: He was diagnosed with bipolar disorder at the age of 30. There were multiple hospitalizations and medication trial. There is history of treatment noncompliance. There were several suicide attempts including by cutting.     Past Medical History:  Past Medical History:  Diagnosis Date  . Diabetes mellitus without complication (HCC)   . Hydrocephalus   . Kidney stones     Past Surgical History:  Procedure Laterality Date  . KIDNEY STONE SURGERY      Family History:  Family History  Problem Relation Age of Onset  . Breast cancer Mother    Family Psychiatric  History:  Social History:  Social History   Substance and Sexual Activity  Alcohol Use No   Comment: ocasional      Social History   Substance and Sexual Activity  Drug Use Yes  . Types: Marijuana   Comment: "as often as I can"    Social History   Socioeconomic History  . Marital status: Single    Spouse name: None  . Number of children: None  . Years of education: None  . Highest education level: None  Social Needs  . Financial resource strain: None  . Food insecurity - worry: None  . Food insecurity - inability: None  . Transportation needs - medical: None  . Transportation needs - non-medical: None  Occupational History  . None  Tobacco Use  . Smoking status: Current Every Day Smoker    Packs/day: 1.00    Types: Cigarettes  . Smokeless tobacco: Never Used  Substance and Sexual Activity  . Alcohol use: No    Comment: ocasional   . Drug use: Yes    Types: Marijuana    Comment: "as often as I can"  . Sexual activity: None  Other Topics Concern  . None  Social History Narrative  . None   Additional Social History:  Sleep: Poor  Appetite:  Poor  Current Medications: Current Facility-Administered Medications  Medication Dose Route Frequency Provider Last Rate Last Dose  . acetaminophen (TYLENOL) tablet 650 mg  650 mg Oral Q6H PRN Clapacs, [name] T, MD      . alum & mag hydroxide-simeth (MAALOX/MYLANTA) 200-200-20 MG/5ML suspension 30 mL  30 mL Oral Q4H PRN Clapacs, [name] T, MD      . divalproex (DEPAKOTE) DR tablet 500 mg  500 mg Oral Q8H Pucilowska, Jolanta B, MD   500 mg at 09/12/17 1437  . feeding supplement (GLUCERNA SHAKE) (GLUCERNA SHAKE) liquid 237 mL  237 mL Oral TID BM Pucilowska, Jolanta B, MD   237 mL at 09/12/17 1415  . gabapentin (NEURONTIN) capsule 1,200 mg  1,200 mg Oral TID Pucilowska, Jolanta B,  MD   1,200 mg at 09/12/17 1139  . haloperidol (HALDOL) tablet 5 mg  5 mg Oral BID Pucilowska, Jolanta B, MD   5 mg at 09/12/17 0843  . hydrOXYzine (ATARAX/VISTARIL) tablet 25 mg  25 mg Oral Q4H PRN Aundria Rud, MD   25 mg at 09/12/17 1139  . Influenza vac split quadrivalent PF (FLUARIX) injection 0.5 mL  0.5 mL Intramuscular Tomorrow-1000 Pucilowska, Jolanta B, MD      . insulin aspart (novoLOG) injection 0-15 Units  0-15 Units Subcutaneous TID WC Clapacs, Jackquline Denmark, MD   8 Units at 09/12/17 1145  . insulin aspart (novoLOG) injection 0-5 Units  0-5 Units Subcutaneous QHS Clapacs, Jackquline Denmark, MD   2 Units at 09/10/17 2329  . insulin glargine (LANTUS) injection 30 Units  30 Units Subcutaneous Daily Milagros Loll, MD   30 Units at 09/12/17 0749  . magnesium hydroxide (MILK OF MAGNESIA) suspension 30 mL  30 mL Oral Daily PRN Clapacs, [name] T, MD      . mirtazapine (REMERON) tablet 30 mg  30 mg Oral QHS Clapacs, [name] T, MD   30 mg at 09/11/17 2126  . nicotine (NICODERM CQ - dosed in mg/24 hours) patch 21 mg  21 mg Transdermal Daily Pucilowska, Jolanta B, MD   21 mg at 09/12/17 0748  . polyethylene glycol (MIRALAX / GLYCOLAX) packet 17 g  17 g Oral Daily PRN Clapacs, [name] T, MD      . venlafaxine XR (EFFEXOR-XR) 24 hr capsule 150 mg  150 mg Oral Q breakfast Clapacs, Jackquline Denmark, MD   150 mg at 09/12/17 0748  . zolpidem (AMBIEN) tablet 10 mg  10 mg Oral QHS Pucilowska, Jolanta B, MD   10 mg at 09/11/17 2126    Lab Results:  Results for orders placed or performed during the hospital encounter of 09/10/17 (from the past 48 hour(s))  Glucose, capillary     Status: Abnormal   Collection Time: 09/10/17 10:29 PM  Result Value Ref Range   Glucose-Capillary 282 (H) 65 - 99 mg/dL   Comment 1 Notify RN   Hemoglobin A1c     Status: Abnormal   Collection Time: 09/11/17  7:32 AM  Result Value Ref Range   Hgb A1c MFr Bld 8.7 (H) 4.8 - 5.6 %    Comment: (NOTE) Pre diabetes:          5.7%-6.4% Diabetes:               >6.4% Glycemic control for   <7.0% adults with diabetes    Mean Plasma Glucose 202.99 mg/dL    Comment: Performed at [facility], [address] [place], [place] [address]  Lipid  panel     Status: Abnormal   Collection Time: 09/11/17  7:32 AM  Result Value Ref Range   Cholesterol 217 (H) 0 - 200 mg/dL   Triglycerides 161135 <096<150 mg/dL   HDL 49 >04>40 mg/dL   Total CHOL/HDL Ratio 4.4 RATIO   VLDL 27 0 - 40 mg/dL   LDL Cholesterol 540141 (H) 0 - 99 mg/dL    Comment:        Total Cholesterol/HDL:CHD Risk Coronary Heart Disease Risk Table                     Men   Women  1/2 Average Risk   3.4   3.3  Average Risk       5.0   4.4  2 X Average Risk   9.6   7.1  3 X Average Risk  23.4   11.0        Use the calculated Patient Ratio above and the CHD Risk Table to determine the patient's CHD Risk.        ATP III CLASSIFICATION (LDL):  <100     mg/dL   Optimal  [id number]  mg/dL   Near or Above                    Optimal  130-159  mg/dL   Borderline  [id number]  mg/dL   High  >621>190     mg/dL   Very High   TSH     Status: Abnormal   Collection Time: 09/11/17  7:32 AM  Result Value Ref Range   TSH 11.747 (H) 0.350 - 4.500 uIU/mL    Comment: Performed by a 3rd Generation assay with a functional sensitivity of <=0.01 uIU/mL.  Glucose, capillary     Status: Abnormal   Collection Time: 09/11/17  7:37 AM  Result Value Ref Range   Glucose-Capillary 228 (H) 65 - 99 mg/dL   Comment 1 Notify RN   Glucose, capillary     Status: Abnormal   Collection Time: 09/11/17 12:10 PM  Result Value Ref Range   Glucose-Capillary 412 (H) 65 - 99 mg/dL  Glucose, capillary     Status: Abnormal   Collection Time: 09/11/17  4:08 PM  Result Value Ref Range   Glucose-Capillary 488 (H) 65 - 99 mg/dL   Comment 1 Notify RN   Glucose, capillary     Status: Abnormal   Collection Time: 09/11/17  9:03 PM  Result Value Ref Range   Glucose-Capillary 111 (H) 65 - 99 mg/dL  Glucose, capillary     Status: Abnormal    Collection Time: 09/12/17  6:47 AM  Result Value Ref Range   Glucose-Capillary 322 (H) 65 - 99 mg/dL  Glucose, capillary     Status: Abnormal   Collection Time: 09/12/17 11:43 AM  Result Value Ref Range   Glucose-Capillary 266 (H) 65 - 99 mg/dL    Blood Alcohol level:  Lab Results  Component Value Date   ETH <10 09/08/2017   ETH <10 08/18/2017    Metabolic Disorder Labs: Lab Results  Component Value Date   HGBA1C 8.7 (H) 09/11/2017   MPG 202.99 09/11/2017   No results found for: PROLACTIN Lab Results  Component Value Date   CHOL 217 (H) 09/11/2017   TRIG 135 09/11/2017   HDL 49 09/11/2017   CHOLHDL 4.4 09/11/2017   VLDL 27 09/11/2017   LDLCALC 141 (H) 09/11/2017   LDLCALC 216 (H) 08/23/2017    Physical  Findings: AIMS:  , ,  ,  ,    CIWA:    COWS:     Musculoskeletal: Strength & Muscle Tone: within normal limits Gait & Station: normal Patient leans: N/A  Psychiatric Specialty Exam: Physical Exam  ROS  Blood pressure (!) 102/57, pulse 71, temperature 98.6 F (37 C), temperature source Oral, resp. rate 18, height 6\' 2"  (1.88 m), weight 214 lb (97.1 kg), SpO2 99 %.Body mass index is 27.48 kg/m.  General Appearance: Disheveled  Eye Contact:  Poor  Speech:  Slow  Volume:  Decreased  Mood:  Depressed  Affect:  Constricted  Thought Process:  Coherent  Orientation:  Full (Time, Place, and Person)  Thought Content:  Logical  Suicidal Thoughts:  Yes.  with intent/plan  Homicidal Thoughts:  No  Memory:  NA  Judgement:  Poor  Insight:  Shallow  Psychomotor Activity:  Decreased  AIMS (if indicated):     Assets:  Others:  Minimal  ADL's:  Intact  Cognition: Not tested  Sleep:  Number of Hours: 7.15     Treatment Plan Summary: Daily contact with patient to assess and evaluate symptoms and progress in treatment and Medication management\  [name] is a 31 year old male with a history of depression,, type 1 diabetes and ttreatment noncompliance transferred  from ICU where he was hospitalized for severe hypoglycemia from presumed insulin overdose.  His blood glucose continues to have a between 200-400.  #Suicidal ideation -the patient wants to die following discharge -able to contract for safety in the hospital -Could consider electroconvulsive therapy if there is no resolution in suicidal ideation.  #Mood -Continue Effexor 150 mg daily -Continue Remeron 30 mg nightly  #Diabetes -Lantus 15 units daily, ADA diet, SSI, blood glucose monitoring -refused food and insulin today -blood glucose 451at 12:00 -offer Glucerna -medicine consult is greatly appreciated  #Insomnia -Trazodone 100 mg nightly  #Social -patient has no income, health insurance, housing or support  #Disposition -TBE   Aundria RudGopalkumar Laurielle Selmon, MD 09/12/2017, 3:22 PM

## 2017-09-12 NOTE — Progress Notes (Signed)
Up in the dayroom much of the shift. Endorses anxiety and depression rated 9/10. Admits to passive SI , no HI or AVH. CS at 2100 was 415 and on repeat it was 451. Novolog 5units was given.  MD notified, Stat lab draw ordered. Result is 427. Urine for ketones was ordered. Staff and peers report that the pt eats a lot of food. He drank 3 cartons of milk after seeing that his blood sugar was 415. Pt was instructed to stop eating for now. Is in bed sleeping at this time. Remains on routine obs for safety.

## 2017-09-12 NOTE — Plan of Care (Signed)
Patient up ad lib with steady gait, A&O x 3 with steady gait. Compliant with medication and meals. Present in the milieu with appropriate but selective interaction with his peers. Complaints of anxiety to nurse, MD notified of such with new medication ordered. BS will be monitored with coverage provided if needed. Denies SI/HI/AVH. Milieu remains safe with q 15 minute safety checks.

## 2017-09-13 LAB — KETONES, URINE: Ketones, ur: 5 mg/dL — AB

## 2017-09-13 LAB — GLUCOSE, CAPILLARY
GLUCOSE-CAPILLARY: 397 mg/dL — AB (ref 65–99)
GLUCOSE-CAPILLARY: 170 mg/dL — AB (ref 65–99)
Glucose-Capillary: 252 mg/dL — ABNORMAL HIGH (ref 65–99)
Glucose-Capillary: 167 mg/dL — ABNORMAL HIGH (ref 65–99)

## 2017-09-13 MED ORDER — INSULIN GLARGINE 100 UNIT/ML ~~LOC~~ SOLN
38.0000 [IU] | Freq: Every day | SUBCUTANEOUS | Status: DC
  Administered 2017-09-14: 38 [IU] via SUBCUTANEOUS
  Filled 2017-09-13: qty 0.38

## 2017-09-13 MED ORDER — INSULIN GLARGINE 100 UNIT/ML ~~LOC~~ SOLN
36.0000 [IU] | Freq: Every day | SUBCUTANEOUS | Status: DC
  Filled 2017-09-13: qty 0.36

## 2017-09-13 MED ORDER — VENLAFAXINE HCL ER 75 MG PO CP24
225.0000 mg | ORAL_CAPSULE | Freq: Every day | ORAL | Status: DC
  Administered 2017-09-14 – 2017-09-20 (×7): 225 mg via ORAL
  Filled 2017-09-13 (×7): qty 3

## 2017-09-13 NOTE — [facility] Group Notes (Signed)
[facility] Group Notes:  (Nursing/MHT/Case Management/Adjunct)  Date:  09/13/2017  Time:  6:26 PM  Type of Therapy:  Psychoeducational Skills  Participation Level:  Did Not Attend  Mark [name] 09/13/2017, 6:26 PM   

## 2017-09-13 NOTE — Progress Notes (Signed)
Recreation Therapy Notes  Date: 11.23.18  Time: 1:00 pm  Location: Craft Room  Behavioral response: N/A  Intervention Topic: Communication   Discussion/Intervention: Patient did not attend group. Clinical Observations/Feedback:  Patient did not attend group.  Lorean Ekstrand LRT/CTRS         [facility] 09/13/2017 1:52 PM

## 2017-09-13 NOTE — Progress Notes (Signed)
Patient was sad and depressed.  Patient was isolative but did come to dayroom to eat lunch and dinner.   Due to his carb modified diet, patient carb intake was limited causing patient to become upset and threaten to refuse all further meals.  Therapeutic discussion between patient and staff helped patient gain understanding of the reason behind the diet modification.

## 2017-09-13 NOTE — [facility] Group Notes (Signed)
[facility] Group Notes:  (Nursing/MHT/Case Management/Adjunct)  Date:  09/13/2017  Time:  9:23 PM  Type of Therapy:  Group Therapy  Participation Level:  Minimal  Participation Quality:  Appropriate  Affect:  Appropriate  Cognitive:  Appropriate  Insight:  Good  Engagement in Group:  Engaged  Modes of Intervention:  Support  Summary of Progress/Problems:  Mark [name] 09/13/2017, 9:23 PM

## 2017-09-13 NOTE — Progress Notes (Signed)
Inpatient Diabetes Program Recommendations  AACE/ADA: New Consensus Statement on Inpatient Glycemic Control (2015)  Target Ranges:  Prepandial:   less than 140 mg/dL      Peak postprandial:   less than 180 mg/dL (1-2 hours)      Critically ill patients:  140 - 180 mg/dL   Lab Results  Component Value Date   GLUCAP 167 (H) 09/13/2017   HGBA1C 8.7 (H) 09/11/2017    Review of Glycemic ControlResults for Mark [name], Derelle Q (MRN [id number]) as of 09/13/2017 14:19  Ref. Range 09/12/2017 06:47 09/12/2017 11:43 09/12/2017 16:24 09/12/2017 21:09 09/12/2017 21:15 09/13/2017 06:39 09/13/2017 11:51  Glucose-Capillary Latest Ref Range: 65 - 99 mg/dL 409322 (H) 811266 (H) 914142 (H) 415 (H) 451 (H) 252 (H) 167 (H)   Diabetes history: DM 2 Outpatient Diabetes medications: Lantus 38 units q HS, Novolog 15 units tid with meals Current orders for Inpatient glycemic control:  Lantus 36 units daily, Novolog moderate tid with meals and HS  Inpatient Diabetes Program Recommendations:    Note wide fluctuations in blood sugars.  Patient likely needs resumption of Novolog with meals.  Consider restarting Novolog 6 units tid with meals (hold if patient eats less than 50% or if he refuses meal).  This will hopefully help cover CHO intake.  Agree with increase in Lantus to 36 units.  May [facility] "Hospitalist Consult" [facility]    Thanks, Beryl MeagerJenny Tonie Elsey, RN, BC-ADM Inpatient Diabetes Coordinator Pager [phone] (8a-5p)

## 2017-09-13 NOTE — Progress Notes (Signed)
Visible in the milieu, has an irritable edge at times. Urine for ketones sent to the lab.At snack time foods with high carbs were named. Asked that he limit his intake. Argued that what he eats has nothing to do with his blood sugar. CS was 170 and no insulin was given. Pt ate a moderate amount of carbs in spite of our conversation. Endorses anxiety and depression and rates them 7/10. (9/10 yesterday). Still has suicidal thoughts "all the time". Contracts for safety. Continues with unsteady gate. No other physical symptoms. Remains on routine obs for safety.

## 2017-09-13 NOTE — Progress Notes (Signed)
Recreation Therapy Notes  INPATIENT RECREATION THERAPY ASSESSMENT  Patient Details Name: Mark [name] MRN: [id number] DOB: 12/02/1985 Today's Date: 09/13/2017  Patient Stressors: Family, Relationship, Death, Friends, Work, School  Coping Skills:   Isolate  Personal Challenges: Anger  Leisure Interests (2+):  Art - Draw(Sleep)  Awareness of Community Resources:  Product managerYes  Community Resources:  Other (Comment)([facility], Shelter)  Current Use: Yes  If no, Barriers?:    Patient Strengths:  N/A  Patient Identified Areas of Improvement:  Everything  Current Recreation Participation:  N/A  Patient Goal for Hospitalization:  Get the help I need.   [place]   County of Residence:     Current SI (including self-harm):  No  Current HI:  No  Consent to Intern Participation: N/A   Elyanna Wallick 09/13/2017, 9:09 AM

## 2017-09-13 NOTE — [facility] Group Notes (Signed)
[facility] LCSW Group Therapy Note  Date/Time: 09/13/17, 0930  Type of Therapy and Topic:  Group Therapy:  Feelings around Relapse and Recovery  Participation Level:  Did Not Attend   Mood:  Description of Group:    Patients in this group will discuss emotions they experience before and after a relapse. They will process how experiencing these feelings, or avoidance of experiencing them, relates to having a relapse. Facilitator will guide patients to explore emotions they have related to recovery. Patients will be encouraged to process which emotions are more powerful. They will be guided to discuss the emotional reaction significant others in their lives may have to patients' relapse or recovery. Patients will be assisted in exploring ways to respond to the emotions of others without this contributing to a relapse.  Therapeutic Goals: 1. Patient will identify two or more emotions that lead to relapse for them:  2. Patient will identify two emotions that result when they relapse:  3. Patient will identify two emotions related to recovery:  4. Patient will demonstrate ability to communicate their needs through discussion and/or role plays.   Summary of Patient Progress:     Therapeutic Modalities:   Cognitive Behavioral Therapy Solution-Focused Therapy Assertiveness Training Relapse Prevention Therapy  Greg Shawnika Pepin, LCSW       

## 2017-09-13 NOTE — Progress Notes (Addendum)
[facility] MD Progress Note  09/13/2017 1:10 PM Mark [name]  MRN:  [id number] Subjective: Patient endorses that he feels down and depressed.  Does not feel like getting out of his bed.  Skipped breakfast this morning.  Last night's blood glucose was in the 400s.  Nursing staff reported that patient binges at night and this consequently resulted in elevated blood glucose.  Tells me today that he will kill himself once he gets out of the hospital.  Denies any side effects from the venlafaxine or mirtazapine. Principal Problem: Severe recurrent major depression without psychotic features (HCC) Diagnosis:   Patient Active Problem List   Diagnosis Date Noted  . Diabetes (HCC) [E11.9] 09/11/2017  . Hypoglycemia [E16.2] 09/09/2017  . MDD (major depressive disorder) [F32.9] 08/22/2017  . Severe recurrent major depression without psychotic features (HCC) [F33.2] 08/19/2017  . Nausea & vomiting [R11.2] 08/18/2017  . Abdominal pain [R10.9] 08/18/2017  . Hyponatremia [E87.1] 08/18/2017  . DKA (diabetic ketoacidoses) (HCC) [E13.10] 11/24/2015  . Diabetic keto-acidosis (HCC) [E13.10] 10/23/2015   Total Time spent with patient: 30 minutes  Past Psychiatric History: He was diagnosed with bipolar disorder at the age of 65. There were multiple hospitalizations and medication trial. There is history of treatment noncompliance. There were several suicide attempts including by cutting.     Past Medical History:  Past Medical History:  Diagnosis Date  . Diabetes mellitus without complication (HCC)   . Hydrocephalus   . Kidney stones     Past Surgical History:  Procedure Laterality Date  . KIDNEY STONE SURGERY     Family History:  Family History  Problem Relation Age of Onset  . Breast cancer Mother    Family Psychiatric  History:  Social History:  Social History   Substance and Sexual Activity  Alcohol Use No   Comment: ocasional      Social History   Substance and Sexual Activity  Drug  Use Yes  . Types: Marijuana   Comment: "as often as I can"    Social History   Socioeconomic History  . Marital status: Single    Spouse name: None  . Number of children: None  . Years of education: None  . Highest education level: None  Social Needs  . Financial resource strain: None  . Food insecurity - worry: None  . Food insecurity - inability: None  . Transportation needs - medical: None  . Transportation needs - non-medical: None  Occupational History  . None  Tobacco Use  . Smoking status: Current Every Day Smoker    Packs/day: 1.00    Types: Cigarettes  . Smokeless tobacco: Never Used  Substance and Sexual Activity  . Alcohol use: No    Comment: ocasional   . Drug use: Yes    Types: Marijuana    Comment: "as often as I can"  . Sexual activity: None  Other Topics Concern  . None  Social History Narrative  . None   Additional Social History:                         Sleep: Poor  Appetite:  Poor  Current Medications: Current Facility-Administered Medications  Medication Dose Route Frequency Provider Last Rate Last Dose  . acetaminophen (TYLENOL) tablet 650 mg  650 mg Oral Q6H PRN Clapacs, [name] T, MD      . alum & mag hydroxide-simeth (MAALOX/MYLANTA) 200-200-20 MG/5ML suspension 30 mL  30 mL Oral Q4H PRN Clapacs, [name] T,  MD      . divalproex (DEPAKOTE) DR tablet 500 mg  500 mg Oral Q8H Pucilowska, Jolanta B, MD   500 mg at 09/13/17 0620  . feeding supplement (GLUCERNA SHAKE) (GLUCERNA SHAKE) liquid 237 mL  237 mL Oral TID BM Pucilowska, Jolanta B, MD   237 mL at 09/12/17 1415  . gabapentin (NEURONTIN) capsule 1,200 mg  1,200 mg Oral TID Pucilowska, Jolanta B, MD   1,200 mg at 09/13/17 0859  . haloperidol (HALDOL) tablet 5 mg  5 mg Oral BID Pucilowska, Jolanta B, MD   5 mg at 09/13/17 0859  . hydrOXYzine (ATARAX/VISTARIL) tablet 25 mg  25 mg Oral Q4H PRN Aundria Rud, MD   25 mg at 09/12/17 2104  . Influenza vac split quadrivalent PF (FLUARIX)  injection 0.5 mL  0.5 mL Intramuscular Tomorrow-1000 Pucilowska, Jolanta B, MD      . insulin aspart (novoLOG) injection 0-15 Units  0-15 Units Subcutaneous TID WC Clapacs, Jackquline Denmark, MD   3 Units at 09/13/17 1154  . insulin aspart (novoLOG) injection 0-5 Units  0-5 Units Subcutaneous QHS Clapacs, Jackquline Denmark, MD   5 Units at 09/12/17 2133  . [START ON 09/14/2017] insulin glargine (LANTUS) injection 36 Units  36 Units Subcutaneous Daily Sudini, Srikar, MD      . magnesium hydroxide (MILK OF MAGNESIA) suspension 30 mL  30 mL Oral Daily PRN Clapacs, [name] T, MD      . mirtazapine (REMERON) tablet 30 mg  30 mg Oral QHS Clapacs, Jackquline Denmark, MD   30 mg at 09/12/17 2104  . nicotine (NICODERM CQ - dosed in mg/24 hours) patch 21 mg  21 mg Transdermal Daily Pucilowska, Jolanta B, MD   21 mg at 09/13/17 0900  . polyethylene glycol (MIRALAX / GLYCOLAX) packet 17 g  17 g Oral Daily PRN Clapacs, [name] T, MD      . venlafaxine XR (EFFEXOR-XR) 24 hr capsule 150 mg  150 mg Oral Q breakfast Clapacs, [name] T, MD   150 mg at 09/13/17 0900  . zolpidem (AMBIEN) tablet 10 mg  10 mg Oral QHS Pucilowska, Jolanta B, MD   10 mg at 09/12/17 2104    Lab Results:  Results for orders placed or performed during the hospital encounter of 09/10/17 (from the past 48 hour(s))  Glucose, capillary     Status: Abnormal   Collection Time: 09/11/17  4:08 PM  Result Value Ref Range   Glucose-Capillary 488 (H) 65 - 99 mg/dL   Comment 1 Notify RN   Glucose, capillary     Status: Abnormal   Collection Time: 09/11/17  9:03 PM  Result Value Ref Range   Glucose-Capillary 111 (H) 65 - 99 mg/dL  Glucose, capillary     Status: Abnormal   Collection Time: 09/12/17  6:47 AM  Result Value Ref Range   Glucose-Capillary 322 (H) 65 - 99 mg/dL  Glucose, capillary     Status: Abnormal   Collection Time: 09/12/17 11:43 AM  Result Value Ref Range   Glucose-Capillary 266 (H) 65 - 99 mg/dL  Glucose, capillary     Status: Abnormal   Collection Time: 09/12/17   4:24 PM  Result Value Ref Range   Glucose-Capillary 142 (H) 65 - 99 mg/dL  Glucose, capillary     Status: Abnormal   Collection Time: 09/12/17  9:09 PM  Result Value Ref Range   Glucose-Capillary 415 (H) 65 - 99 mg/dL  Glucose, capillary     Status: Abnormal  Collection Time: 09/12/17  9:15 PM  Result Value Ref Range   Glucose-Capillary 451 (H) 65 - 99 mg/dL  Glucose, random     Status: Abnormal   Collection Time: 09/12/17 10:40 PM  Result Value Ref Range   Glucose, Bld 427 (H) 65 - 99 mg/dL  Glucose, capillary     Status: Abnormal   Collection Time: 09/13/17  6:39 AM  Result Value Ref Range   Glucose-Capillary 252 (H) 65 - 99 mg/dL  Glucose, capillary     Status: Abnormal   Collection Time: 09/13/17 11:51 AM  Result Value Ref Range   Glucose-Capillary 167 (H) 65 - 99 mg/dL    Blood Alcohol level:  Lab Results  Component Value Date   ETH <10 09/08/2017   ETH <10 08/18/2017    Metabolic Disorder Labs: Lab Results  Component Value Date   HGBA1C 8.7 (H) 09/11/2017   MPG 202.99 09/11/2017   No results found for: PROLACTIN Lab Results  Component Value Date   CHOL 217 (H) 09/11/2017   TRIG 135 09/11/2017   HDL 49 09/11/2017   CHOLHDL 4.4 09/11/2017   VLDL 27 09/11/2017   LDLCALC 141 (H) 09/11/2017   LDLCALC 216 (H) 08/23/2017    Physical Findings: AIMS:  , ,  ,  ,    CIWA:    COWS:     Musculoskeletal: Strength & Muscle Tone: within normal limits Gait & Station: normal Patient leans: N/A  Psychiatric Specialty Exam: Physical Exam  ROS  Blood pressure 98/64, pulse 75, temperature 97.6 F (36.4 C), temperature source Oral, resp. rate 18, height 6\' 2"  (1.88 m), weight 214 lb (97.1 kg), SpO2 99 %.Body mass index is 27.48 kg/m.  General Appearance: Disheveled  Eye Contact:  Poor  Speech:  Slow  Volume:  Decreased  Mood:  Depressed  Affect:  Constricted  Thought Process:  Coherent  Orientation:  Full (Time, Place, and Person)  Thought Content:  Logical   Suicidal Thoughts:  Yes.  with intent/plan  Homicidal Thoughts:  No  Memory:  NA  Judgement:  Poor  Insight:  Shallow  Psychomotor Activity:  Decreased  AIMS (if indicated):     Assets:  Others:  Minimal  ADL's:  Intact  Cognition: Not tested  Sleep:  Number of Hours: 6.3     Treatment Plan Summary: Daily contact with patient to assess and evaluate symptoms and progress in treatment and Medication management\  Mark [name] is a 31 year old male with a history of depression,, type 1 diabetes and ttreatment noncompliance transferred from ICU where he was hospitalized for severe hypoglycemia from presumed insulin overdose.  His blood glucose continues to have a between 200-400.  #Suicidal ideation -the patient wants to die following discharge -able to contract for safety in the hospital -Could consider electroconvulsive therapy if there is no resolution in suicidal ideation with venlafaxine mirtazapine combination.  #Mood -Increase Effexor dose to 225 mg from tomorrow -Continue Remeron 30 mg nightly  #Diabetes -Lantus increased to 38 units daily from 36 units which is consistent with a outpatient dosing, ADA diet, SSI, blood glucose monitoring -Diabetes coordinator consulted for optimization of Lantus dosing. -Nursing advised to provide sliding scale with correction even if patient skips the meals. -NovoLog insulin 0-15 units thrice daily, hold if patient does not eat a meal or eats less than 50% of his meal.  #Insomnia -Trazodone 100 mg nightly  #Social -patient has no income, health insurance, housing or support  #Disposition -TBE   Aundria RudGopalkumar Nataliah Hatlestad,  MD 09/13/2017, 1:10 PM

## 2017-09-14 LAB — URINE DRUGS OF ABUSE SCREEN W ALC, ROUTINE (REF LAB)
Amphetamines, Urine: NEGATIVE ng/mL
BARBITURATE, UR: NEGATIVE ng/mL
BENZODIAZEPINE QUANT UR: NEGATIVE ng/mL
COCAINE (METAB.): NEGATIVE ng/mL
ETHANOL U, QUAN: NEGATIVE %
Methadone Screen, Urine: NEGATIVE ng/mL
Opiate Quant, Ur: NEGATIVE ng/mL
PROPOXYPHENE, URINE: NEGATIVE ng/mL
Phencyclidine, Ur: NEGATIVE ng/mL

## 2017-09-14 LAB — PANEL 799049
CANNABINOID GC/MS UR: POSITIVE — AB
CARBOXY THC GC/MS CONF: 346 ng/mL

## 2017-09-14 LAB — GLUCOSE, CAPILLARY
GLUCOSE-CAPILLARY: 266 mg/dL — AB (ref 65–99)
GLUCOSE-CAPILLARY: 302 mg/dL — AB (ref 65–99)
Glucose-Capillary: 363 mg/dL — ABNORMAL HIGH (ref 65–99)
Glucose-Capillary: 264 mg/dL — ABNORMAL HIGH (ref 65–99)

## 2017-09-14 LAB — T4: T4 TOTAL: 4.9 ug/dL (ref 4.5–12.0)

## 2017-09-14 MED ORDER — INSULIN ASPART 100 UNIT/ML ~~LOC~~ SOLN
5.0000 [IU] | Freq: Three times a day (TID) | SUBCUTANEOUS | Status: DC
  Administered 2017-09-14 (×2): 5 [IU] via SUBCUTANEOUS
  Filled 2017-09-14: qty 1

## 2017-09-14 MED ORDER — INSULIN ASPART 100 UNIT/ML ~~LOC~~ SOLN
6.0000 [IU] | Freq: Three times a day (TID) | SUBCUTANEOUS | Status: DC
  Administered 2017-09-14 – 2017-09-25 (×23): 6 [IU] via SUBCUTANEOUS
  Filled 2017-09-14 (×16): qty 1

## 2017-09-14 MED ORDER — INSULIN GLARGINE 100 UNIT/ML ~~LOC~~ SOLN
42.0000 [IU] | Freq: Every day | SUBCUTANEOUS | Status: DC
  Administered 2017-09-15 – 2017-09-17 (×3): 42 [IU] via SUBCUTANEOUS
  Filled 2017-09-14 (×3): qty 0.42

## 2017-09-14 NOTE — Progress Notes (Signed)
[facility] MD Progress Note  09/14/2017 3:21 PM Mark [name]  MRN:  [id number] Subjective: Patient endorses that he could not sleep well last night and stayed in bed till 10 AM this morning.  Took his Lantus dose late this morning.  Endorses that his mood continues to be sad, but has begun to start feeling a little better.  I communicated to him about the dose increase in venlafaxine to 225 mg.  Continues to endorse suicidal ideation. Principal Problem: Severe recurrent major depression without psychotic features (HCC) Diagnosis:   Patient Active Problem List   Diagnosis Date Noted  . Diabetes (HCC) [E11.9] 09/11/2017  . Hypoglycemia [E16.2] 09/09/2017  . MDD (major depressive disorder) [F32.9] 08/22/2017  . Severe recurrent major depression without psychotic features (HCC) [F33.2] 08/19/2017  . Nausea & vomiting [R11.2] 08/18/2017  . Abdominal pain [R10.9] 08/18/2017  . Hyponatremia [E87.1] 08/18/2017  . DKA (diabetic ketoacidoses) (HCC) [E13.10] 11/24/2015  . Diabetic keto-acidosis (HCC) [E13.10] 10/23/2015   Total Time spent with patient: 30 minutes  Past Psychiatric History: He was diagnosed with bipolar disorder at the age of 70. There were multiple hospitalizations and medication trial. There is history of treatment noncompliance. There were several suicide attempts including by cutting.     Past Medical History:  Past Medical History:  Diagnosis Date  . Diabetes mellitus without complication (HCC)   . Hydrocephalus   . Kidney stones     Past Surgical History:  Procedure Laterality Date  . KIDNEY STONE SURGERY     Family History:  Family History  Problem Relation Age of Onset  . Breast cancer Mother    Family Psychiatric  History:  Social History:  Social History   Substance and Sexual Activity  Alcohol Use No   Comment: ocasional      Social History   Substance and Sexual Activity  Drug Use Yes  . Types: Marijuana   Comment: "as often as I can"    Social  History   Socioeconomic History  . Marital status: Single    Spouse name: None  . Number of children: None  . Years of education: None  . Highest education level: None  Social Needs  . Financial resource strain: None  . Food insecurity - worry: None  . Food insecurity - inability: None  . Transportation needs - medical: None  . Transportation needs - non-medical: None  Occupational History  . None  Tobacco Use  . Smoking status: Current Every Day Smoker    Packs/day: 1.00    Types: Cigarettes  . Smokeless tobacco: Never Used  Substance and Sexual Activity  . Alcohol use: No    Comment: ocasional   . Drug use: Yes    Types: Marijuana    Comment: "as often as I can"  . Sexual activity: None  Other Topics Concern  . None  Social History Narrative  . None   Additional Social History:                         Sleep: Poor  Appetite:  Poor  Current Medications: Current Facility-Administered Medications  Medication Dose Route Frequency Provider Last Rate Last Dose  . acetaminophen (TYLENOL) tablet 650 mg  650 mg Oral Q6H PRN Clapacs, [name] T, MD      . alum & mag hydroxide-simeth (MAALOX/MYLANTA) 200-200-20 MG/5ML suspension 30 mL  30 mL Oral Q4H PRN Clapacs, Jackquline Denmark, MD      . divalproex (DEPAKOTE) DR  tablet 500 mg  500 mg Oral Q8H Pucilowska, Jolanta B, MD   500 mg at 09/14/17 1435  . feeding supplement (GLUCERNA SHAKE) (GLUCERNA SHAKE) liquid 237 mL  237 mL Oral TID BM Pucilowska, Jolanta B, MD   237 mL at 09/12/17 1415  . gabapentin (NEURONTIN) capsule 1,200 mg  1,200 mg Oral TID Pucilowska, Jolanta B, MD   1,200 mg at 09/14/17 1200  . haloperidol (HALDOL) tablet 5 mg  5 mg Oral BID Pucilowska, Jolanta B, MD   5 mg at 09/14/17 1127  . hydrOXYzine (ATARAX/VISTARIL) tablet 25 mg  25 mg Oral Q4H PRN Aundria Rudakesh, Sally Menard, MD   25 mg at 09/14/17 1132  . Influenza vac split quadrivalent PF (FLUARIX) injection 0.5 mL  0.5 mL Intramuscular Tomorrow-1000 Pucilowska, Jolanta  B, MD      . insulin aspart (novoLOG) injection 6 Units  6 Units Subcutaneous TID WC Aundria Rudakesh, Evana Runnels, MD      . Melene Muller[START ON 09/15/2017] insulin glargine (LANTUS) injection 42 Units  42 Units Subcutaneous Daily Sudini, Srikar, MD      . magnesium hydroxide (MILK OF MAGNESIA) suspension 30 mL  30 mL Oral Daily PRN Clapacs, [name] T, MD      . mirtazapine (REMERON) tablet 30 mg  30 mg Oral QHS Clapacs, Jackquline DenmarkJohn T, MD   30 mg at 09/13/17 2106  . nicotine (NICODERM CQ - dosed in mg/24 hours) patch 21 mg  21 mg Transdermal Daily Pucilowska, Jolanta B, MD   21 mg at 09/14/17 1136  . polyethylene glycol (MIRALAX / GLYCOLAX) packet 17 g  17 g Oral Daily PRN Clapacs, [name] T, MD      . venlafaxine XR (EFFEXOR-XR) 24 hr capsule 225 mg  225 mg Oral Q breakfast Aundria Rudakesh, Sarahgrace Broman, MD   225 mg at 09/14/17 1127  . zolpidem (AMBIEN) tablet 10 mg  10 mg Oral QHS Pucilowska, Jolanta B, MD   10 mg at 09/13/17 2106    Lab Results:  Results for orders placed or performed during the hospital encounter of 09/10/17 (from the past 48 hour(s))  Glucose, capillary     Status: Abnormal   Collection Time: 09/12/17  4:24 PM  Result Value Ref Range   Glucose-Capillary 142 (H) 65 - 99 mg/dL  Ketones, urine     Status: Abnormal   Collection Time: 09/12/17  8:31 PM  Result Value Ref Range   Ketones, ur 5 (A) NEGATIVE mg/dL  Glucose, capillary     Status: Abnormal   Collection Time: 09/12/17  9:09 PM  Result Value Ref Range   Glucose-Capillary 415 (H) 65 - 99 mg/dL  Glucose, capillary     Status: Abnormal   Collection Time: 09/12/17  9:15 PM  Result Value Ref Range   Glucose-Capillary 451 (H) 65 - 99 mg/dL  Glucose, random     Status: Abnormal   Collection Time: 09/12/17 10:40 PM  Result Value Ref Range   Glucose, Bld 427 (H) 65 - 99 mg/dL  Glucose, capillary     Status: Abnormal   Collection Time: 09/13/17  6:39 AM  Result Value Ref Range   Glucose-Capillary 252 (H) 65 - 99 mg/dL  T4     Status: None   Collection  Time: 09/13/17  6:51 AM  Result Value Ref Range   T4, Total 4.9 4.5 - 12.0 ug/dL    Comment: (NOTE) Performed At: [facility] [address] [place], [place] [id number] Jolene SchimkeNagendra Sanjai MD NW:[id number]:[phone]   Glucose, capillary  Status: Abnormal   Collection Time: 09/13/17 11:51 AM  Result Value Ref Range   Glucose-Capillary 167 (H) 65 - 99 mg/dL  Glucose, capillary     Status: Abnormal   Collection Time: 09/13/17  4:35 PM  Result Value Ref Range   Glucose-Capillary 397 (H) 65 - 99 mg/dL  Glucose, capillary     Status: Abnormal   Collection Time: 09/13/17  9:04 PM  Result Value Ref Range   Glucose-Capillary 170 (H) 65 - 99 mg/dL  Glucose, capillary     Status: Abnormal   Collection Time: 09/14/17  7:07 AM  Result Value Ref Range   Glucose-Capillary 363 (H) 65 - 99 mg/dL   Comment 1 Notify RN   Glucose, capillary     Status: Abnormal   Collection Time: 09/14/17 11:26 AM  Result Value Ref Range   Glucose-Capillary 264 (H) 65 - 99 mg/dL   Comment 1 Notify RN     Blood Alcohol level:  Lab Results  Component Value Date   ETH <10 09/08/2017   ETH <10 08/18/2017    Metabolic Disorder Labs: Lab Results  Component Value Date   HGBA1C 8.7 (H) 09/11/2017   MPG 202.99 09/11/2017   No results found for: PROLACTIN Lab Results  Component Value Date   CHOL 217 (H) 09/11/2017   TRIG 135 09/11/2017   HDL 49 09/11/2017   CHOLHDL 4.4 09/11/2017   VLDL 27 09/11/2017   LDLCALC 141 (H) 09/11/2017   LDLCALC 216 (H) 08/23/2017    Physical Findings: AIMS:  , ,  ,  ,    CIWA:    COWS:     Musculoskeletal: Strength & Muscle Tone: within normal limits Gait & Station: normal Patient leans: N/A  Psychiatric Specialty Exam: Physical Exam  ROS  Blood pressure 120/70, pulse 89, temperature 98.6 F (37 C), temperature source Oral, resp. rate 18, height 6\' 2"  (1.88 m), weight 214 lb (97.1 kg), SpO2 98 %.Body mass index is 27.48 kg/m.  General Appearance: Disheveled  Eye  Contact:  Poor  Speech:  Slow  Volume:  Decreased  Mood:  Depressed  Affect:  Constricted  Thought Process:  Coherent  Orientation:  Full (Time, Place, and Person)  Thought Content:  Logical  Suicidal Thoughts:  Yes.  with intent/plan  Homicidal Thoughts:  No  Memory:  NA  Judgement:  Poor  Insight:  Shallow  Psychomotor Activity:  Decreased  AIMS (if indicated):     Assets:  Others:  Minimal  ADL's:  Intact  Cognition: Not tested  Sleep:  Number of Hours: 6     Treatment Plan Summary: Daily contact with patient to assess and evaluate symptoms and progress in treatment and Medication management\  Mark [name] is a 31 year old male with a history of MDD, severe with suicidal ideation, type 1 diabetes and treatment noncompliance transferred from ICU where he was hospitalized for severe hypoglycemia from presumed insulin overdose.  Blood sugars have been controlled relatively with his new regimen which approximates his outpatient insulin regimen closely.  Lantus insulin increased to 38 units with scheduled NovoLog 6 units dosing with meals.  Venlafaxine increased to 225 mg  #Suicidal ideation -the patient wants to die following discharge -able to contract for safety in the hospital -Could consider electroconvulsive therapy if there is no resolution in suicidal ideation with venlafaxine mirtazapine combination.  #Mood -Continue Effexor dose at 225 mg  -Continue Remeron 30 mg nightly  #Diabetes -Lantus increased to 38 units daily from 36 units which  is consistent with a outpatient dosing, ADA diet, SSI, blood glucose monitoring -Appreciate recommendations from diabetes coordinator. -NovoLog insulin 6 units thrice daily as scheduled, hold if patient does not eat a meal or eats less than 50% of his meal. -D/C sliding scale  #Insomnia -Trazodone 100 mg nightly -Ambien 10 mg at bedtime  #Social -patient has no income, health insurance, housing or  support  #Disposition -TBE   Aundria Rud, MD 09/14/2017, 3:21 PM

## 2017-09-14 NOTE — [facility] Group Notes (Signed)
[facility] Group Notes:  (Nursing/MHT/Case Management/Adjunct)  Date:  09/14/2017  Time:  10:21 PM  Type of Therapy:  Psychoeducational Skills  Participation Level:  Minimal  Participation Quality:  Appropriate  Affect:  Appropriate  Cognitive:  Appropriate  Insight:  Appropriate  Engagement in Group:  Engaged  Modes of Intervention:  Discussion, Socialization and Support  Summary of Progress/Problems:  Sansone SingerJustin  [name] 09/14/2017, 10:21 PM

## 2017-09-14 NOTE — Progress Notes (Signed)
[facility] MD Progress Note  09/14/2017 3:26 PM Mark [name]  MRN:  [id number] Subjective: Patient endorses that he could not sleep well last night and stayed in bed till 10 AM this morning.  Took his Lantus dose late this morning.  Endorses that his mood continues to be sad, but has begun to start feeling a little better.  I communicated to him about the dose increase in venlafaxine to 225 mg.  Continues to endorse suicidal ideation. Principal Problem: Severe recurrent major depression without psychotic features (HCC) Diagnosis:   Patient Active Problem List   Diagnosis Date Noted  . Diabetes (HCC) [E11.9] 09/11/2017  . Hypoglycemia [E16.2] 09/09/2017  . MDD (major depressive disorder) [F32.9] 08/22/2017  . Severe recurrent major depression without psychotic features (HCC) [F33.2] 08/19/2017  . Nausea & vomiting [R11.2] 08/18/2017  . Abdominal pain [R10.9] 08/18/2017  . Hyponatremia [E87.1] 08/18/2017  . DKA (diabetic ketoacidoses) (HCC) [E13.10] 11/24/2015  . Diabetic keto-acidosis (HCC) [E13.10] 10/23/2015   Total Time spent with patient: 30 minutes  Past Psychiatric History: He was diagnosed with bipolar disorder at the age of 42. There were multiple hospitalizations and medication trial. There is history of treatment noncompliance. There were several suicide attempts including by cutting.     Past Medical History:  Past Medical History:  Diagnosis Date  . Diabetes mellitus without complication (HCC)   . Hydrocephalus   . Kidney stones     Past Surgical History:  Procedure Laterality Date  . KIDNEY STONE SURGERY     Family History:  Family History  Problem Relation Age of Onset  . Breast cancer Mother    Family Psychiatric  History:  Social History:  Social History   Substance and Sexual Activity  Alcohol Use No   Comment: ocasional      Social History   Substance and Sexual Activity  Drug Use Yes  . Types: Marijuana   Comment: "as often as I can"    Social  History   Socioeconomic History  . Marital status: Single    Spouse name: None  . Number of children: None  . Years of education: None  . Highest education level: None  Social Needs  . Financial resource strain: None  . Food insecurity - worry: None  . Food insecurity - inability: None  . Transportation needs - medical: None  . Transportation needs - non-medical: None  Occupational History  . None  Tobacco Use  . Smoking status: Current Every Day Smoker    Packs/day: 1.00    Types: Cigarettes  . Smokeless tobacco: Never Used  Substance and Sexual Activity  . Alcohol use: No    Comment: ocasional   . Drug use: Yes    Types: Marijuana    Comment: "as often as I can"  . Sexual activity: None  Other Topics Concern  . None  Social History Narrative  . None   Additional Social History:                         Sleep: Poor  Appetite:  Poor  Current Medications: Current Facility-Administered Medications  Medication Dose Route Frequency Provider Last Rate Last Dose  . acetaminophen (TYLENOL) tablet 650 mg  650 mg Oral Q6H PRN Clapacs, [name] T, MD      . alum & mag hydroxide-simeth (MAALOX/MYLANTA) 200-200-20 MG/5ML suspension 30 mL  30 mL Oral Q4H PRN Clapacs, Jackquline Denmark, MD      . divalproex (DEPAKOTE) DR  tablet 500 mg  500 mg Oral Q8H Pucilowska, Jolanta B, MD   500 mg at 09/14/17 1435  . feeding supplement (GLUCERNA SHAKE) (GLUCERNA SHAKE) liquid 237 mL  237 mL Oral TID BM Pucilowska, Jolanta B, MD   237 mL at 09/12/17 1415  . gabapentin (NEURONTIN) capsule 1,200 mg  1,200 mg Oral TID Pucilowska, Jolanta B, MD   1,200 mg at 09/14/17 1200  . haloperidol (HALDOL) tablet 5 mg  5 mg Oral BID Pucilowska, Jolanta B, MD   5 mg at 09/14/17 1127  . hydrOXYzine (ATARAX/VISTARIL) tablet 25 mg  25 mg Oral Q4H PRN Aundria Rudakesh, Benz Vandenberghe, MD   25 mg at 09/14/17 1132  . Influenza vac split quadrivalent PF (FLUARIX) injection 0.5 mL  0.5 mL Intramuscular Tomorrow-1000 Pucilowska, Jolanta  B, MD      . insulin aspart (novoLOG) injection 6 Units  6 Units Subcutaneous TID WC Aundria Rudakesh, Nazair Fortenberry, MD      . Melene Muller[START ON 09/15/2017] insulin glargine (LANTUS) injection 42 Units  42 Units Subcutaneous Daily Sudini, Srikar, MD      . magnesium hydroxide (MILK OF MAGNESIA) suspension 30 mL  30 mL Oral Daily PRN Clapacs, [name] T, MD      . mirtazapine (REMERON) tablet 30 mg  30 mg Oral QHS Clapacs, Jackquline DenmarkJohn T, MD   30 mg at 09/13/17 2106  . nicotine (NICODERM CQ - dosed in mg/24 hours) patch 21 mg  21 mg Transdermal Daily Pucilowska, Jolanta B, MD   21 mg at 09/14/17 1136  . polyethylene glycol (MIRALAX / GLYCOLAX) packet 17 g  17 g Oral Daily PRN Clapacs, [name] T, MD      . venlafaxine XR (EFFEXOR-XR) 24 hr capsule 225 mg  225 mg Oral Q breakfast Aundria Rudakesh, Birt Reinoso, MD   225 mg at 09/14/17 1127  . zolpidem (AMBIEN) tablet 10 mg  10 mg Oral QHS Pucilowska, Jolanta B, MD   10 mg at 09/13/17 2106    Lab Results:  Results for orders placed or performed during the hospital encounter of 09/10/17 (from the past 48 hour(s))  Glucose, capillary     Status: Abnormal   Collection Time: 09/12/17  4:24 PM  Result Value Ref Range   Glucose-Capillary 142 (H) 65 - 99 mg/dL  Ketones, urine     Status: Abnormal   Collection Time: 09/12/17  8:31 PM  Result Value Ref Range   Ketones, ur 5 (A) NEGATIVE mg/dL  Glucose, capillary     Status: Abnormal   Collection Time: 09/12/17  9:09 PM  Result Value Ref Range   Glucose-Capillary 415 (H) 65 - 99 mg/dL  Glucose, capillary     Status: Abnormal   Collection Time: 09/12/17  9:15 PM  Result Value Ref Range   Glucose-Capillary 451 (H) 65 - 99 mg/dL  Glucose, random     Status: Abnormal   Collection Time: 09/12/17 10:40 PM  Result Value Ref Range   Glucose, Bld 427 (H) 65 - 99 mg/dL  Glucose, capillary     Status: Abnormal   Collection Time: 09/13/17  6:39 AM  Result Value Ref Range   Glucose-Capillary 252 (H) 65 - 99 mg/dL  T4     Status: None   Collection  Time: 09/13/17  6:51 AM  Result Value Ref Range   T4, Total 4.9 4.5 - 12.0 ug/dL    Comment: (NOTE) Performed At: [facility] [address] [place], [place] [id number] Jolene SchimkeNagendra Sanjai MD VH:[id number]:[phone]   Glucose, capillary  Status: Abnormal   Collection Time: 09/13/17 11:51 AM  Result Value Ref Range   Glucose-Capillary 167 (H) 65 - 99 mg/dL  Glucose, capillary     Status: Abnormal   Collection Time: 09/13/17  4:35 PM  Result Value Ref Range   Glucose-Capillary 397 (H) 65 - 99 mg/dL  Glucose, capillary     Status: Abnormal   Collection Time: 09/13/17  9:04 PM  Result Value Ref Range   Glucose-Capillary 170 (H) 65 - 99 mg/dL  Glucose, capillary     Status: Abnormal   Collection Time: 09/14/17  7:07 AM  Result Value Ref Range   Glucose-Capillary 363 (H) 65 - 99 mg/dL   Comment 1 Notify RN   Glucose, capillary     Status: Abnormal   Collection Time: 09/14/17 11:26 AM  Result Value Ref Range   Glucose-Capillary 264 (H) 65 - 99 mg/dL   Comment 1 Notify RN     Blood Alcohol level:  Lab Results  Component Value Date   ETH <10 09/08/2017   ETH <10 08/18/2017    Metabolic Disorder Labs: Lab Results  Component Value Date   HGBA1C 8.7 (H) 09/11/2017   MPG 202.99 09/11/2017   No results found for: PROLACTIN Lab Results  Component Value Date   CHOL 217 (H) 09/11/2017   TRIG 135 09/11/2017   HDL 49 09/11/2017   CHOLHDL 4.4 09/11/2017   VLDL 27 09/11/2017   LDLCALC 141 (H) 09/11/2017   LDLCALC 216 (H) 08/23/2017    Physical Findings: AIMS:  , ,  ,  ,    CIWA:    COWS:     Musculoskeletal: Strength & Muscle Tone: within normal limits Gait & Station: normal Patient leans: N/A  Psychiatric Specialty Exam: Physical Exam  GI: There is no guarding.    ROS  Blood pressure 120/70, pulse 89, temperature 98.6 F (37 C), temperature source Oral, resp. rate 18, height 6\' 2"  (1.88 m), weight 214 lb (97.1 kg), SpO2 98 %.Body mass index is 27.48 kg/m.  General  Appearance: Disheveled  Eye Contact:  Poor  Speech:  Slow  Volume:  Decreased  Mood:  Depressed  Affect:  Constricted  Thought Process:  Coherent  Orientation:  Full (Time, Place, and Person)  Thought Content:  Logical  Suicidal Thoughts:  Yes.  with intent/plan  Homicidal Thoughts:  No  Memory:  NA  Judgement:  Poor  Insight:  Shallow  Psychomotor Activity:  Decreased  AIMS (if indicated):     Assets:  Others:  Minimal  ADL's:  Intact  Cognition: Not tested  Sleep:  Number of Hours: 6     Treatment Plan Summary: Daily contact with patient to assess and evaluate symptoms and progress in treatment and Medication management\  Mark [name] is a 31 year old male with a history of MDD, severe with suicidal ideation, type 1 diabetes and treatment noncompliance transferred from ICU where he was hospitalized for severe hypoglycemia from presumed insulin overdose.  Blood sugars have been controlled relatively with his new regimen which approximates his outpatient insulin regimen closely.  Lantus insulin increased to 38 units with scheduled NovoLog 6 units dosing with meals.  Venlafaxine increased to 225 mg  #Suicidal ideation -the patient wants to die following discharge -able to contract for safety in the hospital -Could consider electroconvulsive therapy if there is no resolution in suicidal ideation with venlafaxine mirtazapine combination.  #Mood -Continue Effexor dose at 225 mg  -Continue Remeron 30 mg nightly  #Diabetes (A1c of  8.7) -Lantus increased to 38 units daily from 36 units which is consistent with a outpatient dosing, ADA diet, SSI, blood glucose monitoring -Appreciate recommendations from diabetes coordinator. -NovoLog insulin 6 units thrice daily as scheduled, hold if patient does not eat a meal or eats less than 50% of his meal. -D/C sliding scale  #Insomnia -Trazodone 100 mg nightly -Ambien 10 mg at bedtime  #Abnormal TSH (11.747) -T4 is in the upper normal  limit. -We will consult hospitalist on whether he needs thyroxine supplementation.  #Social -patient has no income, health insurance, housing or support  #Disposition -TBE   Aundria RudGopalkumar Jaidon Sponsel, MD 09/14/2017, 3:26 PM

## 2017-09-14 NOTE — Plan of Care (Signed)
Patient is functioning at an adequate level and understands his treatment plan/information provided to him. Patient is compliant with treatment plan/medication regimen. Patient has been free from injury during this visit and has remained safe on the unit at this time. Patient's vital signs have are stabilized at this time. Patient has been participating in some unit activities and interacting well with other members on the unit. Patient endorses passive SI without a plan and denies HI/AVH at this time. Patient's clinical measurements are within normal limits at this time besides maintaining his CBG. Patient has remained free of infection and any other complications. Patient nutrition has been maintained. Patient complains of not getting enough sleep last night so he wants to rest. Patient rates his anxiety level as a "9/10" and states that "everything" contributes to his anxiety. Patient states that "exercising helps sometimes" when it comes to his anxiety and depression.

## 2017-09-14 NOTE — [facility] Group Notes (Signed)
09/14/2017 1:15pm  Type of Therapy and Topic: Group Therapy: Feelings Around Returning Home & Establishing a Supportive Framework and Supporting Oneself When Supports Not Available  Participation Level: Did Not Attend    Virgilio BellingNNIA  CUEBAS-COLON, LCSWA 09/14/2017 12:48 PM

## 2017-09-14 NOTE — Progress Notes (Signed)
Visible in the milieu, social with peers. Continues to endorse anxiety and depression rated 8/10. Admits to thoughts of harming himself but has no plan and is able to contract for safety. HS blood sugar was 302. Rakesh notified. No orders given. Was med compliant, asked for trazodone for sleep. Settled for atarax since there is no order for it. States he has trouble sleeping. Was irritable at times but pleasant mostly. In bed at this time.

## 2017-09-14 NOTE — Progress Notes (Signed)
D- Patient alert and oriented. Patient states that he did not sleep well last night and all he wanted to do this morning is sleep. Patient presents in a groggy/depressed mood and states that "waking up" makes him depressed. Patient endorses passive SI without a plan stating that  Patient denies, HI, AVH, and pain at this time. Patient reports that he is in pain on his self-inventory stating that his pain is in his legs, but patient doesn't express to this writer these complaints.    A- Scheduled medications administered to patient, per MD orders. Support and encouragement provided.  Routine safety checks conducted every 15 minutes.  Patient informed to notify staff with problems or concerns.  R- No adverse drug reactions noted. Patient contracts for safety at this time. Patient compliant with medications and treatment plan. Patient receptive, calm, and cooperative. Patient interacts well with others on the unit.  Patient remains safe at this time.

## 2017-09-15 LAB — GLUCOSE, CAPILLARY
Glucose-Capillary: 250 mg/dL — ABNORMAL HIGH (ref 65–99)
Glucose-Capillary: 266 mg/dL — ABNORMAL HIGH (ref 65–99)
Glucose-Capillary: 323 mg/dL — ABNORMAL HIGH (ref 65–99)
Glucose-Capillary: 392 mg/dL — ABNORMAL HIGH (ref 65–99)
Glucose-Capillary: 406 mg/dL — ABNORMAL HIGH (ref 65–99)

## 2017-09-15 MED ORDER — TRAZODONE HCL 100 MG PO TABS
100.0000 mg | ORAL_TABLET | Freq: Every day | ORAL | Status: DC
  Administered 2017-09-15 – 2017-09-16 (×2): 100 mg via ORAL
  Filled 2017-09-15 (×2): qty 1

## 2017-09-15 NOTE — Plan of Care (Signed)
  Activity: Sleeping patterns will improve 09/15/2017 1837 - Progressing by Angela AdamBeaudry, Aiyonna Lucado E, RN   Education: Knowledge of [facility] Education information/materials will improve 09/15/2017 1837 - Progressing by Angela AdamBeaudry, Wendell Fiebig E, RN

## 2017-09-15 NOTE — Progress Notes (Addendum)
[facility] MD Progress Note  09/15/2017 11:09 AM CASPIAN [name]  MRN:  [id number] Subjective: Patient continues to have difficulty sleeping at night after increasing dose of venlafaxine to 225 mg.  Took his morning medications today.  Has not voiced suicidal ideation to me for 2 days.  His blood sugars oscillate between 200s and 300s.  Endorses being sad, but starting to feel a little better compared to before.  Is not able to tell me where he would go after discharge. Has been seen to be interacting with patients outside his room.  Has been walking about in the hallway as well and more interactive with staff than before. Principal Problem: Severe recurrent major depression without psychotic features (HCC) Diagnosis:   Patient Active Problem List   Diagnosis Date Noted  . Diabetes (HCC) [E11.9] 09/11/2017  . Hypoglycemia [E16.2] 09/09/2017  . MDD (major depressive disorder) [F32.9] 08/22/2017  . Severe recurrent major depression without psychotic features (HCC) [F33.2] 08/19/2017  . Nausea & vomiting [R11.2] 08/18/2017  . Abdominal pain [R10.9] 08/18/2017  . Hyponatremia [E87.1] 08/18/2017  . DKA (diabetic ketoacidoses) (HCC) [E13.10] 11/24/2015  . Diabetic keto-acidosis (HCC) [E13.10] 10/23/2015   Total Time spent with patient: 30 minutes  Past Psychiatric History: He was diagnosed with bipolar disorder at the age of 76. There were multiple hospitalizations and medication trial. There is history of treatment noncompliance. There were several suicide attempts including by cutting.     Past Medical History:  Past Medical History:  Diagnosis Date  . Diabetes mellitus without complication (HCC)   . Hydrocephalus   . Kidney stones     Past Surgical History:  Procedure Laterality Date  . KIDNEY STONE SURGERY     Family History:  Family History  Problem Relation Age of Onset  . Breast cancer Mother    Family Psychiatric  History:  Social History:  Social History   Substance and  Sexual Activity  Alcohol Use No   Comment: ocasional      Social History   Substance and Sexual Activity  Drug Use Yes  . Types: Marijuana   Comment: "as often as I can"    Social History   Socioeconomic History  . Marital status: Single    Spouse name: None  . Number of children: None  . Years of education: None  . Highest education level: None  Social Needs  . Financial resource strain: None  . Food insecurity - worry: None  . Food insecurity - inability: None  . Transportation needs - medical: None  . Transportation needs - non-medical: None  Occupational History  . None  Tobacco Use  . Smoking status: Current Every Day Smoker    Packs/day: 1.00    Types: Cigarettes  . Smokeless tobacco: Never Used  Substance and Sexual Activity  . Alcohol use: No    Comment: ocasional   . Drug use: Yes    Types: Marijuana    Comment: "as often as I can"  . Sexual activity: None  Other Topics Concern  . None  Social History Narrative  . None   Additional Social History:                         Sleep: Poor  Appetite:  Poor  Current Medications: Current Facility-Administered Medications  Medication Dose Route Frequency Provider Last Rate Last Dose  . acetaminophen (TYLENOL) tablet 650 mg  650 mg Oral Q6H PRN Clapacs, Jackquline Denmark, MD      .  alum & mag hydroxide-simeth (MAALOX/MYLANTA) 200-200-20 MG/5ML suspension 30 mL  30 mL Oral Q4H PRN Clapacs, [name] T, MD      . divalproex (DEPAKOTE) DR tablet 500 mg  500 mg Oral Q8H Pucilowska, Jolanta B, MD   500 mg at 09/15/17 0552  . feeding supplement (GLUCERNA SHAKE) (GLUCERNA SHAKE) liquid 237 mL  237 mL Oral TID BM Pucilowska, Jolanta B, MD   237 mL at 09/14/17 2119  . gabapentin (NEURONTIN) capsule 1,200 mg  1,200 mg Oral TID Pucilowska, Jolanta B, MD   1,200 mg at 09/15/17 1051  . haloperidol (HALDOL) tablet 5 mg  5 mg Oral BID Pucilowska, Jolanta B, MD   5 mg at 09/15/17 1052  . hydrOXYzine (ATARAX/VISTARIL) tablet 25 mg   25 mg Oral Q4H PRN Aundria Rud, MD   25 mg at 09/14/17 2210  . Influenza vac split quadrivalent PF (FLUARIX) injection 0.5 mL  0.5 mL Intramuscular Tomorrow-1000 Pucilowska, Jolanta B, MD      . insulin aspart (novoLOG) injection 6 Units  6 Units Subcutaneous TID WC Aundria Rud, MD   6 Units at 09/14/17 1658  . insulin glargine (LANTUS) injection 42 Units  42 Units Subcutaneous Daily Milagros Loll, MD   42 Units at 09/15/17 1102  . magnesium hydroxide (MILK OF MAGNESIA) suspension 30 mL  30 mL Oral Daily PRN Clapacs, [name] T, MD      . mirtazapine (REMERON) tablet 30 mg  30 mg Oral QHS Clapacs, [name] T, MD   30 mg at 09/14/17 2119  . nicotine (NICODERM CQ - dosed in mg/24 hours) patch 21 mg  21 mg Transdermal Daily Pucilowska, Jolanta B, MD   21 mg at 09/15/17 1052  . polyethylene glycol (MIRALAX / GLYCOLAX) packet 17 g  17 g Oral Daily PRN Clapacs, [name] T, MD      . venlafaxine XR (EFFEXOR-XR) 24 hr capsule 225 mg  225 mg Oral Q breakfast Aundria Rud, MD   225 mg at 09/15/17 1051  . zolpidem (AMBIEN) tablet 10 mg  10 mg Oral QHS Pucilowska, Jolanta B, MD   10 mg at 09/14/17 2119    Lab Results:  Results for orders placed or performed during the hospital encounter of 09/10/17 (from the past 48 hour(s))  Glucose, capillary     Status: Abnormal   Collection Time: 09/13/17 11:51 AM  Result Value Ref Range   Glucose-Capillary 167 (H) 65 - 99 mg/dL  Glucose, capillary     Status: Abnormal   Collection Time: 09/13/17  4:35 PM  Result Value Ref Range   Glucose-Capillary 397 (H) 65 - 99 mg/dL  Glucose, capillary     Status: Abnormal   Collection Time: 09/13/17  9:04 PM  Result Value Ref Range   Glucose-Capillary 170 (H) 65 - 99 mg/dL  Glucose, capillary     Status: Abnormal   Collection Time: 09/14/17  7:07 AM  Result Value Ref Range   Glucose-Capillary 363 (H) 65 - 99 mg/dL   Comment 1 Notify RN   Glucose, capillary     Status: Abnormal   Collection Time: 09/14/17 11:26  AM  Result Value Ref Range   Glucose-Capillary 264 (H) 65 - 99 mg/dL   Comment 1 Notify RN   Glucose, capillary     Status: Abnormal   Collection Time: 09/14/17  4:55 PM  Result Value Ref Range   Glucose-Capillary 266 (H) 65 - 99 mg/dL  Glucose, capillary     Status: Abnormal   Collection  Time: 09/14/17  9:26 PM  Result Value Ref Range   Glucose-Capillary 302 (H) 65 - 99 mg/dL  Glucose, capillary     Status: Abnormal   Collection Time: 09/15/17  6:46 AM  Result Value Ref Range   Glucose-Capillary 250 (H) 65 - 99 mg/dL    Blood Alcohol level:  Lab Results  Component Value Date   ETH <10 09/08/2017   ETH <10 08/18/2017    Metabolic Disorder Labs: Lab Results  Component Value Date   HGBA1C 8.7 (H) 09/11/2017   MPG 202.99 09/11/2017   No results found for: PROLACTIN Lab Results  Component Value Date   CHOL 217 (H) 09/11/2017   TRIG 135 09/11/2017   HDL 49 09/11/2017   CHOLHDL 4.4 09/11/2017   VLDL 27 09/11/2017   LDLCALC 141 (H) 09/11/2017   LDLCALC 216 (H) 08/23/2017    Physical Findings: AIMS:  , ,  ,  ,    CIWA:    COWS:     Musculoskeletal: Strength & Muscle Tone: within normal limits Gait & Station: normal Patient leans: N/A  Psychiatric Specialty Exam: Physical Exam  ROS  Blood pressure 102/71, pulse 89, temperature 98 F (36.7 C), temperature source Oral, resp. rate 18, height 6\' 2"  (1.88 m), weight 214 lb (97.1 kg), SpO2 98 %.Body mass index is 27.48 kg/m.  General Appearance: Disheveled  Eye Contact:  Poor  Speech:  Slow  Volume:  Decreased  Mood: Dysthymic  Affect:  Constricted  Thought Process:  Coherent  Orientation:  Full (Time, Place, and Person)  Thought Content:  Logical  Suicidal Thoughts: Transient and fleeting but still present  Homicidal Thoughts:  No  Memory:  NA  Judgement:  Poor  Insight:  Shallow  Psychomotor Activity: Normal  AIMS (if indicated):     Assets:  Others:  Minimal  ADL's:  Intact  Cognition: Not tested   Sleep:  Number of Hours: 7     Treatment Plan Summary: Daily contact with patient to assess and evaluate symptoms and progress in treatment and Medication management\  [name] is a 31 year old male with a history of MDD, severe with suicidal ideation, type 1 diabetes and treatment noncompliance transferred from ICU where he was hospitalized for severe hypoglycemia from presumed insulin overdose.  Blood sugars have been controlled relatively with his new regimen which approximates his outpatient insulin regimen closely.  Lantus insulin increased to 38 units with scheduled NovoLog 6 units dosing with meals.  Venlafaxine increased to 225 mg  #Suicidal ideation -Has had reduction in severity of suicidal ideation after admission. -able to contract for safety in the hospital -Could consider electroconvulsive therapy if there is no resolution in suicidal ideation with venlafaxine mirtazapine combination.  #Mood -Continue Effexor dose at 225 mg  -Continue Remeron 30 mg nightly -Continue Depakote 500 mg 3 times daily. -Valproic acid level tomorrow morning.  #Diabetes (A1c of 8.7) -Lantus increased to 38 units daily from 36 units which is consistent with a outpatient dosing, ADA diet, SSI, blood glucose monitoring -Appreciate recommendations from diabetes coordinator. -NovoLog insulin 6 units thrice daily as scheduled, hold if patient does not eat a meal or eats less than 50% of his meal. -D/C sliding scale  #Insomnia -Unfortunately patient has not been getting trazodone at night.  Has been getting only Ambien.  Restarted on trazodone 100 mg at night in addition to the Ambien (09/15/17) -Ambien 10 mg at bedtime  #Abnormal TSH (11.747) -T4 is in the upper normal limit. -We will  consult hospitalist on whether he needs thyroxine supplementation.  #Social -patient has no income, health insurance, housing or support  #Disposition -TBE   Aundria RudGopalkumar Lexxi Koslow, MD 09/15/2017, 11:09  AM

## 2017-09-15 NOTE — [facility] Group Notes (Signed)
09/15/2017 1:15pm  Type of Therapy and Topic: Group Therapy: Holding on to Grudges   Participation Level: Did Not Attend   Description of Group:  In this group patients will be asked to explore and define a grudge. Patients will be guided to discuss their thoughts, feelings, and reasons as to why people have grudges. Patients will process the impact grudges have on daily life and identify thoughts and feelings related to holding grudges. Facilitator will challenge patients to identify ways to let go of grudges and the benefits this provides. Patients will be confronted to address why one struggles letting go of grudges. Lastly, patients will identify feelings and thoughts related to what life would look like without grudges. This group will be process-oriented, with patients participating in exploration of their own experiences, giving and receiving support, and processing challenge from other group members.  Therapeutic Goals:  1. Patient will identify specific grudges related to their personal life.  2. Patient will identify feelings, thoughts, and beliefs around grudges.  3. Patient will identify how one releases grudges appropriately.  4. Patient will identify situations where they could have let go of the grudge, but instead chose to hold on.   Summary of Patient Progress:   Therapeutic Modalities:  Cognitive Behavioral Therapy  Solution Focused Therapy  Motivational Interviewing  Brief Therapy   Scottlyn Mchaney  CUEBAS-COLON, LCSWA 09/15/2017 11:36 AM

## 2017-09-15 NOTE — Progress Notes (Signed)
D: Patient continues to report poor sleep at night and is sleeping during the day.  Patient becomes irritable at times when he is asked to come get his meds.  He will come get them and has been compliant with them.  Patient has passive thoughts of self harm, however, he contracts for safety on the unit.  Patient appears anxious and irritable at times, however, becomes talkative after a while.  Patient is homeless at this time.   A: Continue to monitor medication management and MD orders.  Safety checks completed every 15 minutes per protocol.  Offer support and encouragement as needed. R: Patient is receptive to staff; his behavior is appropriate.

## 2017-09-15 NOTE — Progress Notes (Signed)
Notified Dr. Kathy Breachakesh of CBG of 406 @ 1630.  No new orders placed.  MD asked that 6 U of novolog standing be given.  No additional insulin to be given.  Re-evaluate tomorrow.

## 2017-09-16 LAB — GLUCOSE, CAPILLARY
GLUCOSE-CAPILLARY: 38 mg/dL — AB (ref 65–99)
GLUCOSE-CAPILLARY: 132 mg/dL — AB (ref 65–99)
GLUCOSE-CAPILLARY: 228 mg/dL — AB (ref 65–99)
GLUCOSE-CAPILLARY: 246 mg/dL — AB (ref 65–99)
Glucose-Capillary: 226 mg/dL — ABNORMAL HIGH (ref 65–99)

## 2017-09-16 LAB — VALPROIC ACID LEVEL: Valproic Acid Lvl: 93 ug/mL (ref 50.0–100.0)

## 2017-09-16 NOTE — Progress Notes (Signed)
Was pleasant on contact, Endorses anxiety and depression rated 6/10. Is social with select peers and was visible in the milieu. CS at 2030 was 323. Pt reminded to limit his intake. Was seen eat several crackers, cereal and milk. Is not compliant with his diet though he agrees to decrease carb intake.  Pt was assisted to bed via wc at 2230. He was asleep in the dayroom. Remains on routine obs for safety.

## 2017-09-16 NOTE — Progress Notes (Addendum)
At 2330 pt pulled his BR alarm and stated he had fallen. Pt was found by staff sitting on the BR floor. Stated he fell trying to urinate. States he hit his head "a little", hehad no clo pain, no redness or bump was noted and there were no other injuries. VSS, blood sugar was 352. He was not sedated at this time. NVS are WNL.  Dr Kathy Breachakesh was notified, advised to monitor the pt closely. A bed alarm was applied. VS as per protocol are stable. Pt is asleep at this time and Q15 minute obs continue.   0130 VSS, pupils are equal. Pt is asleep. Opened his eyes on command. Skin is warm and dry. 0530 VSS, awakens easily, is alert and oriented x4. Skin is warm and dry. PERL. Reminded the pt that he has the bed alarm on and not to get up alone. No clo pain.

## 2017-09-16 NOTE — Progress Notes (Signed)
Inpatient Diabetes Program Recommendations  AACE/ADA: New Consensus Statement on Inpatient Glycemic Control (2015)  Target Ranges:  Prepandial:   less than 140 mg/dL      Peak postprandial:   less than 180 mg/dL (1-2 hours)      Critically ill patients:  140 - 180 mg/dL    Results for Mark [name], Hendry Q (MRN [id number]) as of 09/16/2017 11:42  Ref. Range 09/15/2017 06:46 09/15/2017 11:31 09/15/2017 16:25 09/15/2017 20:51 09/15/2017 23:36  Glucose-Capillary Latest Ref Range: 65 - 99 mg/dL 119250 (H)    Novolog held 266 (H)  6 units Novolog +  42 units Lantus  406 (H)  6 units Novolog 323 (H) 392 (H)   Results for Mark [name], [name] (MRN [id number]) as of 09/16/2017 11:42  Ref. Range 09/16/2017 06:34 09/16/2017 11:24  Glucose-Capillary Latest Ref Range: 65 - 99 mg/dL 130226 (H)  6 units Novolog +   42 units Lantus 38 (LL)    Home DM Meds: Lantus 38 units QHS       Novolog 15 units TID  Current Insulin Orders: Lantus 42 units daily       Novolog 6 units TID       MD- Note patient with extremely labile CBGs.  CBGs elevated all day yesterday.  Now patient with severe Hypoglycemia at 11am today.    Please consider the following adjustments:  1. Start back Novolog Sensitive Correction Scale/ SSI (0-9 units) TID AC + HS (Use Glycemic Control Order set)  2. Reduce Lantus slightly to 40 units daily      --Will follow patient during hospitalization--  Ambrose FinlandJeannine Johnston Jillane Po RN, MSN, CDE Diabetes Coordinator Inpatient Glycemic Control Team Team Pager: [phone] (8a-5p)

## 2017-09-16 NOTE — Plan of Care (Signed)
Patient verbalized suicidal thoughts.Contracts for safety in the unit.

## 2017-09-16 NOTE — Progress Notes (Addendum)
CSW and Cassie, Peer Support at [facility], [phone], met with pt to discuss options.  Cassie spoke to [employer], who is saying pt cannot return.  Cassie also spoke to [employer] and this is a potential option for pt.  He can only be on 2 psychotropic medicines and there are a lot of rules there.  Cassie can pick pt up and transport him as soon as tomorrow if this works out.  CSW and pt called [employer], [phone].  We were directed to call [facility], [phone].  Mr. Nyoka Cowden reports he just admitted 2 people, does not have a bed, but he spoke to pt and asked that we call back at 4pm.  He is going to "move some people around" and may be able to make room for pt. Winferd Humphrey, MSW, LCSW Clinical Social Worker 09/16/2017 12:46 PM   CSW and pt called [facility].  Mr. Nyoka Cowden asked more questions and appears to be working on the situation but again asked the pt to call him back tomorrow at [place] left message with Cassie from [facility] with update that we would look to discharge tomorrow if the housing comes through. Winferd Humphrey, MSW, LCSW Clinical Social Worker 09/16/2017 4:16 PM

## 2017-09-16 NOTE — Progress Notes (Signed)
Patient is irritable and depressed this morning.Patient verbalized "I am suicidal I am going to stab myself when I get out from here."Contracts safety in the hospital.Patints mood is brighter by evening.Visible in the milieu.Appropriate with staff & peers.Compliant with medications.Appetite and energy level good.Support and encouragement given.

## 2017-09-16 NOTE — [facility] Group Notes (Signed)
[facility] Group Notes:  (Nursing/MHT/Case Management/Adjunct)  Date:  09/16/2017  Time:  2:59 PM  Type of Therapy:  Psychoeducational Skills  Participation Level:  Did Not Attend  Mark [name] [facility] 09/16/2017, 2:59 PM

## 2017-09-16 NOTE — Progress Notes (Addendum)
[facility]  09/16/2017 4:20 PM Mark [name]  MRN:  [id number]  Subjective:   Mark [name] is still depressed, withdrawn and suicidal. Blood sugar low this morning. Patient does not engage.  Treatment plan. We continue haldol 5 mg twice daily, Depakote, Neurontin, Remeron 30 mg nightly and higher dose of Effexor 225 mg daily.   Social/disposition. He is homeless, jobless and uninsured. He has no social support. He is not allowed to return to the homeless shelter. [place] [facility]  Principal Problem: Severe recurrent major depression without psychotic features (HCC) Diagnosis:   Patient Active Problem List   Diagnosis Date Noted  . Diabetes (HCC) [E11.9] 09/11/2017  . Hypoglycemia [E16.2] 09/09/2017  . MDD (major depressive disorder) [F32.9] 08/22/2017  . Severe recurrent major depression without psychotic features (HCC) [F33.2] 08/19/2017  . Nausea & vomiting [R11.2] 08/18/2017  . Abdominal pain [R10.9] 08/18/2017  . Hyponatremia [E87.1] 08/18/2017  . DKA (diabetic ketoacidoses) (HCC) [E13.10] 11/24/2015  . Diabetic keto-acidosis (HCC) [E13.10] 10/23/2015   Total Time spent with patient: 20 minutes  Past Psychiatric History: bipolar depressiion  Past Medical History:  Past Medical History:  Diagnosis Date  . Diabetes mellitus without complication (HCC)   . Hydrocephalus   . Kidney stones     Past Surgical History:  Procedure Laterality Date  . KIDNEY STONE SURGERY     Family History:  Family History  Problem Relation Age of Onset  . Breast cancer Mother    Family Psychiatric  History: bipolar Social History:  Social History   Substance and Sexual Activity  Alcohol Use No   Comment: ocasional      Social History   Substance and Sexual Activity  Drug Use Yes  . Types: Marijuana   Comment: "as often as I can"    Social History   Socioeconomic History  . Marital status: Single     Spouse name: None  . Number of children: None  . Years of education: None  . Highest education level: None  Social Needs  . Financial resource strain: None  . Food insecurity - worry: None  . Food insecurity - inability: None  . Transportation needs - medical: None  . Transportation needs - non-medical: None  Occupational History  . None  Tobacco Use  . Smoking status: Current Every Day Smoker    Packs/day: 1.00    Types: Cigarettes  . Smokeless tobacco: Never Used  Substance and Sexual Activity  . Alcohol use: No    Comment: ocasional   . Drug use: Yes    Types: Marijuana    Comment: "as often as I can"  . Sexual activity: None  Other Topics Concern  . None  Social History Narrative  . None   Additional Social History:                         Sleep: Fair  Appetite:  Fair  Current Medications: Current Facility-Administered Medications  Medication Dose Route Frequency Provider Last Rate Last Dose  . acetaminophen (TYLENOL) tablet 650 mg  650 mg Oral Q6H PRN Clapacs, [name] T, MD      . alum & mag hydroxide-simeth (MAALOX/MYLANTA) 200-200-20 MG/5ML suspension 30 mL  30 mL Oral Q4H PRN Clapacs, [name] T, MD      . divalproex (DEPAKOTE) DR tablet 500 mg  500 mg Oral Q8H Arpi Diebold B, MD   500  mg at 09/16/17 1229  . feeding supplement (GLUCERNA SHAKE) (GLUCERNA SHAKE) liquid 237 mL  237 mL Oral TID BM Garrie Woodin B, MD   237 mL at 09/14/17 2119  . gabapentin (NEURONTIN) capsule 1,200 mg  1,200 mg Oral TID Yerachmiel Spinney B, MD   1,200 mg at 09/16/17 1202  . haloperidol (HALDOL) tablet 5 mg  5 mg Oral BID Linkon Siverson B, MD   5 mg at 09/16/17 0809  . hydrOXYzine (ATARAX/VISTARIL) tablet 25 mg  25 mg Oral Q4H PRN Aundria Rud, MD   25 mg at 09/15/17 2134  . Influenza vac split quadrivalent PF (FLUARIX) injection 0.5 mL  0.5 mL Intramuscular Tomorrow-1000 Anzley Dibbern B, MD      . insulin aspart (novoLOG) injection 6 Units  6 Units  Subcutaneous TID WC Aundria Rud, MD   6 Units at 09/16/17 0809  . insulin glargine (LANTUS) injection 42 Units  42 Units Subcutaneous Daily Milagros Loll, MD   42 Units at 09/16/17 [phone]  . magnesium hydroxide (MILK OF MAGNESIA) suspension 30 mL  30 mL Oral Daily PRN Clapacs, [name] T, MD      . mirtazapine (REMERON) tablet 30 mg  30 mg Oral QHS Clapacs, Jackquline Denmark, MD   30 mg at 09/15/17 2133  . nicotine (NICODERM CQ - dosed in mg/24 hours) patch 21 mg  21 mg Transdermal Daily Erin Obando B, MD   21 mg at 09/16/17 0812  . polyethylene glycol (MIRALAX / GLYCOLAX) packet 17 g  17 g Oral Daily PRN Clapacs, [name] T, MD      . traZODone (DESYREL) tablet 100 mg  100 mg Oral QHS Aundria Rud, MD   100 mg at 09/15/17 2133  . venlafaxine XR (EFFEXOR-XR) 24 hr capsule 225 mg  225 mg Oral Q breakfast Aundria Rud, MD   225 mg at 09/16/17 0809  . zolpidem (AMBIEN) tablet 10 mg  10 mg Oral QHS Tinita Brooker B, MD   10 mg at 09/15/17 2134    Lab Results:  Results for orders placed or performed during the hospital encounter of 09/10/17 (from the past 48 hour(s))  Glucose, capillary     Status: Abnormal   Collection Time: 09/14/17  4:55 PM  Result Value Ref Range   Glucose-Capillary 266 (H) 65 - 99 mg/dL  Glucose, capillary     Status: Abnormal   Collection Time: 09/14/17  9:26 PM  Result Value Ref Range   Glucose-Capillary 302 (H) 65 - 99 mg/dL  Glucose, capillary     Status: Abnormal   Collection Time: 09/15/17  6:46 AM  Result Value Ref Range   Glucose-Capillary 250 (H) 65 - 99 mg/dL  Glucose, capillary     Status: Abnormal   Collection Time: 09/15/17 11:31 AM  Result Value Ref Range   Glucose-Capillary 266 (H) 65 - 99 mg/dL  Glucose, capillary     Status: Abnormal   Collection Time: 09/15/17  4:25 PM  Result Value Ref Range   Glucose-Capillary 406 (H) 65 - 99 mg/dL   Comment 1 Notify RN    Comment 2 Document in Chart   Glucose, capillary     Status: Abnormal    Collection Time: 09/15/17  8:51 PM  Result Value Ref Range   Glucose-Capillary 323 (H) 65 - 99 mg/dL  Glucose, capillary     Status: Abnormal   Collection Time: 09/15/17 11:36 PM  Result Value Ref Range   Glucose-Capillary 392 (H) 65 - 99 mg/dL  Glucose,  capillary     Status: Abnormal   Collection Time: 09/16/17  6:34 AM  Result Value Ref Range   Glucose-Capillary 226 (H) 65 - 99 mg/dL  Valproic acid level     Status: None   Collection Time: 09/16/17  7:31 AM  Result Value Ref Range   Valproic Acid Lvl 93 50.0 - 100.0 ug/mL  Glucose, capillary     Status: Abnormal   Collection Time: 09/16/17 11:24 AM  Result Value Ref Range   Glucose-Capillary 38 (LL) 65 - 99 mg/dL  Glucose, capillary     Status: Abnormal   Collection Time: 09/16/17 12:28 PM  Result Value Ref Range   Glucose-Capillary 132 (H) 65 - 99 mg/dL    Blood Alcohol level:  Lab Results  Component Value Date   ETH <10 09/08/2017   ETH <10 08/18/2017    Metabolic Disorder Labs: Lab Results  Component Value Date   HGBA1C 8.7 (H) 09/11/2017   MPG 202.99 09/11/2017   No results found for: PROLACTIN Lab Results  Component Value Date   CHOL 217 (H) 09/11/2017   TRIG 135 09/11/2017   HDL 49 09/11/2017   CHOLHDL 4.4 09/11/2017   VLDL 27 09/11/2017   LDLCALC 141 (H) 09/11/2017   LDLCALC 216 (H) 08/23/2017    Physical Findings: AIMS:  , ,  ,  ,    CIWA:    COWS:     Musculoskeletal: Strength & Muscle Tone: within normal limits Gait & Station: normal Patient leans: N/A  Psychiatric Specialty Exam: Physical Exam  Nursing note and vitals reviewed. Psychiatric: His speech is normal. Judgment normal. His affect is blunt. He is slowed and withdrawn. Cognition and memory are normal. He exhibits a depressed mood. He expresses suicidal ideation.    Review of Systems  Neurological: Negative.   Psychiatric/Behavioral: Positive for depression and suicidal ideas. The patient has insomnia.   All other systems  reviewed and are negative.   Blood pressure 123/74, pulse 65, temperature 98 F (36.7 C), temperature source Oral, resp. rate 20, height 6\' 2"  (1.88 m), weight 97.1 kg (214 lb), SpO2 96 %.Body mass index is 27.48 kg/m.  General Appearance: Casual  Eye Contact:  Good  Speech:  Clear and Coherent  Volume:  Normal  Mood:  Depressed and Hopeless  Affect:  Blunt  Thought Process:  Goal Directed and Descriptions of Associations: Intact  Orientation:  Full (Time, Place, and Person)  Thought Content:  WDL  Suicidal Thoughts:  Yes.  with intent/plan  Homicidal Thoughts:  No  Memory:  Immediate;   Fair Recent;   Fair Remote;   Fair  Judgement:  Poor  Insight:  Lacking  Psychomotor Activity:  Normal  Concentration:  Concentration: Fair and Attention Span: Fair  Recall:  [employer] of Knowledge:  Fair  Language:  Fair  Akathisia:  No  Handed:  Right  AIMS (if indicated):     Assets:  Communication Skills Desire for Improvement Resilience  ADL's:  Intact  Cognition:  WNL  Sleep:  Number of Hours: 6.15     Treatment Plan Summary: Daily contact with patient to assess and evaluate symptoms and progress in treatment and Medication management   Mark [name] is a 31 year old male with a history of MDD, severe with suicidal ideation, type 1 diabetes and treatment noncompliance transferred from ICU where he was hospitalized for severe hypoglycemia from presumed insulin overdose.   #Suicidal ideation -the patient wants to die following discharge -able to contract for  safety in the hospital -Could consider electroconvulsive therapy if there is no resolution in suicidal ideation with venlafaxine mirtazapine combination.  #Mood -continue Haldol 5 mg BID -continue Depakote 500 mg TID, VPA level 93 -Continue Effexor dose at 225 mg  -Continue Remeron 30 mg nightly  #Diabetes -Lantus increased to 38 units daily from 36 units which is consistent with a outpatient dosing, ADA diet, SSI,  blood glucose monitoring, BG low this morning -Appreciate recommendations from diabetes coordinator. -NovoLog insulin 6 units thrice daily as scheduled, hold if patient does not eat a meal or eats less than 50% of his meal -diabetes nurse coordinator consult  #Insomnia -Trazodone 100 mg nightly -Ambien 10 mg at bedtime  #Social -patient has no income, health insurance, housing or support  #Disposition -TBE    Kristine LineaJolanta Myca Perno, MD 09/16/2017, 4:20 PM

## 2017-09-16 NOTE — Progress Notes (Signed)
Recreation Therapy Notes    Date: 11.26.18    Time: 1:00pm    Location: Craft Room    Behavioral response:  N/A   Intervention Topic: Values    Discussion/Intervention:  Patient did not attend group.   Clinical Observations/Feedback:   Patient did not attend group.   Jafeth Mustin LRT/CTRS         Atticus Lemberger 09/16/2017 1:51 PM

## 2017-09-16 NOTE — Tx Team (Signed)
Interdisciplinary Treatment and Diagnostic Plan Update  09/16/2017 Time of Session: 1030 Mark [name] MRN: [id number]  Principal Diagnosis: Severe recurrent major depression without psychotic features [name])  Secondary Diagnoses: Principal Problem:   Severe recurrent major depression without psychotic features ([name]) Active Problems:   Diabetes ([name])   Current Medications:  Current Facility-Administered Medications  Medication Dose Route Frequency Provider Last Rate Last Dose  . acetaminophen (TYLENOL) tablet 650 mg  650 mg Oral Q6H PRN Clapacs, [name] T, MD      . alum & mag hydroxide-simeth (MAALOX/MYLANTA) 200-200-20 MG/5ML suspension 30 mL  30 mL Oral Q4H PRN Clapacs, [name] T, MD      . divalproex (DEPAKOTE) DR tablet 500 mg  500 mg Oral Q8H Pucilowska, Jolanta B, MD   500 mg at 09/16/17 0524  . feeding supplement (GLUCERNA SHAKE) (GLUCERNA SHAKE) liquid 237 mL  237 mL Oral TID BM Pucilowska, Jolanta B, MD   237 mL at 09/14/17 2119  . gabapentin (NEURONTIN) capsule 1,200 mg  1,200 mg Oral TID Pucilowska, Jolanta B, MD   1,200 mg at 09/16/17 0809  . haloperidol (HALDOL) tablet 5 mg  5 mg Oral BID Pucilowska, Jolanta B, MD   5 mg at 09/16/17 0809  . hydrOXYzine (ATARAX/VISTARIL) tablet 25 mg  25 mg Oral Q4H PRN Ramond Dial, MD   25 mg at 09/15/17 2134  . Influenza vac split quadrivalent PF (FLUARIX) injection 0.5 mL  0.5 mL Intramuscular Tomorrow-1000 Pucilowska, Jolanta B, MD      . insulin aspart (novoLOG) injection 6 Units  6 Units Subcutaneous TID WC Ramond Dial, MD   6 Units at 09/16/17 0809  . insulin glargine (LANTUS) injection 42 Units  42 Units Subcutaneous Daily Hillary Bow, MD   42 Units at 09/16/17 [id number]  . magnesium hydroxide (MILK OF MAGNESIA) suspension 30 mL  30 mL Oral Daily PRN Clapacs, [name] T, MD      . mirtazapine (REMERON) tablet 30 mg  30 mg Oral QHS Clapacs, Madie Reno, MD   30 mg at 09/15/17 2133  . nicotine (NICODERM CQ - dosed in mg/24 hours) patch  21 mg  21 mg Transdermal Daily Pucilowska, Jolanta B, MD   21 mg at 09/16/17 0812  . polyethylene glycol (MIRALAX / GLYCOLAX) packet 17 g  17 g Oral Daily PRN Clapacs, [name] T, MD      . traZODone (DESYREL) tablet 100 mg  100 mg Oral QHS Ramond Dial, MD   100 mg at 09/15/17 2133  . venlafaxine XR (EFFEXOR-XR) 24 hr capsule 225 mg  225 mg Oral Q breakfast Ramond Dial, MD   225 mg at 09/16/17 0809  . zolpidem (AMBIEN) tablet 10 mg  10 mg Oral QHS Pucilowska, Jolanta B, MD   10 mg at 09/15/17 2134   PTA Medications: Medications Prior to Admission  Medication Sig Dispense Refill Last Dose  . gabapentin (NEURONTIN) 400 MG capsule Take 3 capsules (1,200 mg total) 3 (three) times daily by mouth. 270 capsule 0 09/09/2017 at 0630  . insulin aspart (NOVOLOG) 100 UNIT/ML injection Inject 0-9 Units 3 (three) times daily with meals into the skin. Sliding Scale CBG 70 - 120: 0 units  CBG 121 - 150: 1 unit  CBG 151 - 200: 2 units  CBG 201 - 250: 3 units  CBG 251 - 300: 5 units  CBG 301 - 350: 7 units  CBG 351 - 400 9 units 10 mL 0 PRN at PRN  . insulin aspart (NOVOLOG) 100  UNIT/ML injection Inject 0-5 Units at bedtime into the skin. Bedtime Sliding Scale  CBG 70 - 120: 0 units  CBG 121 - 150: 0 units  CBG 151 - 200: 0 units  CBG 201 - 250: 2 units  CBG 251 - 300: 3 units  CBG 301 - 350: 4 units  CBG 351 - 400: 5 units (Patient not taking: Reported on 09/09/2017) 10 mL 0 Not Taking at Unknown time  . insulin aspart (NOVOLOG) 100 UNIT/ML injection Inject 15 Units 3 (three) times daily with meals into the skin. Do not take if you eat less than 50% of you meal (Patient not taking: Reported on 09/09/2017) 13.5 mL 0 Not Taking at Unknown time  . insulin glargine (LANTUS) 100 UNIT/ML injection Inject 0.38 mLs (38 Units total) into the skin every morning. 11.4 mL 0   . mirtazapine (REMERON) 30 MG tablet Take 1 tablet (30 mg total) at bedtime by mouth. 30 tablet 2 09/08/2017 at 2000  . traZODone  (DESYREL) 100 MG tablet Take 1 tablet (100 mg total) at bedtime as needed by mouth for sleep. 30 tablet 2 09/08/2017 at 2000  . venlafaxine XR (EFFEXOR-XR) 150 MG 24 hr capsule Take 1 capsule (150 mg total) daily with breakfast by mouth. 30 capsule 2 09/08/2017 at 0800  . zolpidem (AMBIEN) 10 MG tablet Take 1 tablet (10 mg total) at bedtime by mouth. (Patient not taking: Reported on 09/09/2017) 30 tablet 1 Completed Course at Unknown time    Patient Stressors: Health problems Loss of Mother Medication change or noncompliance Substance abuse  Patient Strengths: Average or above average intelligence Capable of independent living  Treatment Modalities: Medication Management, Group therapy, Case management,  1 to 1 session with clinician, Psychoeducation, Recreational therapy.   Physician Treatment Plan for Primary Diagnosis: Severe recurrent major depression without psychotic features ([name]) Long Term Goal(s): Improvement in symptoms so as ready for discharge Improvement in symptoms so as ready for discharge   Short Term Goals: Ability to identify changes in lifestyle to reduce recurrence of condition will improve Ability to verbalize feelings will improve Ability to disclose and discuss suicidal ideas Ability to demonstrate self-control will improve Ability to identify and develop effective coping behaviors will improve Ability to maintain clinical measurements within normal limits will improve Compliance with prescribed medications will improve Ability to identify triggers associated with substance abuse/mental health issues will improve NA  Medication Management: Evaluate patient's response, side effects, and tolerance of medication regimen.  Therapeutic Interventions: 1 to 1 sessions, Unit Group sessions and Medication administration.  Evaluation of Outcomes: Not Met  Physician Treatment Plan for Secondary Diagnosis: Principal Problem:   Severe recurrent major depression without  psychotic features ([name]) Active Problems:   Diabetes ([name])  Long Term Goal(s): Improvement in symptoms so as ready for discharge Improvement in symptoms so as ready for discharge   Short Term Goals: Ability to identify changes in lifestyle to reduce recurrence of condition will improve Ability to verbalize feelings will improve Ability to disclose and discuss suicidal ideas Ability to demonstrate self-control will improve Ability to identify and develop effective coping behaviors will improve Ability to maintain clinical measurements within normal limits will improve Compliance with prescribed medications will improve Ability to identify triggers associated with substance abuse/mental health issues will improve NA     Medication Management: Evaluate patient's response, side effects, and tolerance of medication regimen.  Therapeutic Interventions: 1 to 1 sessions, Unit Group sessions and Medication administration.  Evaluation of Outcomes: Not  Met   RN Treatment Plan for Primary Diagnosis: Severe recurrent major depression without psychotic features ([place]) Long Term Goal(s): Knowledge of disease and therapeutic regimen to maintain health will improve  Short Term Goals: Ability to verbalize feelings will improve, Ability to identify and develop effective coping behaviors will improve and Compliance with prescribed medications will improve  Medication Management: RN will administer medications as ordered by provider, will assess and evaluate patient's response and provide education to patient for prescribed medication. RN will report any adverse and/or side effects to prescribing provider.  Therapeutic Interventions: 1 on 1 counseling sessions, Psychoeducation, Medication administration, Evaluate responses to treatment, Monitor vital signs and CBGs as ordered, Perform/monitor CIWA, COWS, AIMS and Fall Risk screenings as ordered, Perform wound care treatments as ordered.  Evaluation of  Outcomes: Not Met   LCSW Treatment Plan for Primary Diagnosis: Severe recurrent major depression without psychotic features ([place]) Long Term Goal(s): Safe transition to appropriate next level of care at discharge, Engage patient in therapeutic group addressing interpersonal concerns.  Short Term Goals: Engage patient in aftercare planning with referrals and resources, Increase social support and Increase skills for wellness and recovery  Therapeutic Interventions: Assess for all discharge needs, 1 to 1 time with Social worker, Explore available resources and support systems, Assess for adequacy in community support network, Educate family and significant other(s) on suicide prevention, Complete Psychosocial Assessment, Interpersonal group therapy.  Evaluation of Outcomes: Not Met   Progress in Treatment: Attending groups: No. Participating in groups: No. Taking medication as prescribed: Yes. Toleration medication: Yes. Family/Significant other contact made: No, will contact:  pt refused consent Patient understands diagnosis: Yes. Discussing patient identified problems/goals with staff: No. Medical problems stabilized or resolved: Yes. Denies suicidal/homicidal ideation: Yes. Issues/concerns per patient self-inventory: No. Other: none  New problem(s) identified: No, Describe:  none  New Short Term/Long Term Goal(s): Pt refused to attend meeting.  Discharge Plan or Barriers: Housing is currently a barrier for discharge. Pt is unsure if he is able to return to the shelter upon discharge. CSW will continue to assess for appropriate discharge plan.   Reason for Continuation of Hospitalization: Depression Medication stabilization  Estimated Length of Stay: 1-2 days  Recreational Therapy: Patient Stressors: Family, Relationship, Death, Friends, Work, School Patient Goal: Patient will identify 3 benefits of self-care x7 days.   Attendees: Patient: 09/16/2017  Physician: Dr.  Bary Leriche, MD 09/16/2017  Nursing: Elige Radon, RN 09/16/2017  RN Care Manager: 09/16/2017  Social Worker: Lurline Idol, LCSW 09/16/2017  Recreational Therapist:  09/16/2017  Other: Alden Hipp, LCSW 09/16/2017  Other:  09/16/2017  Other: 09/16/2017     Scribe for Treatment Team: Alden Hipp, LCSW 09/16/2017 10:55 AM

## 2017-09-16 NOTE — [facility] Group Notes (Signed)
LCSW Group Therapy Note   09/16/2017 9:30am   Type of Therapy and Topic:  Group Therapy:  Overcoming Obstacles   Participation Level:  Did Not Attend   Description of Group:    In this group patients will be encouraged to explore what they see as obstacles to their own wellness and recovery. They will be guided to discuss their thoughts, feelings, and behaviors related to these obstacles. The group will process together ways to cope with barriers, with attention given to specific choices patients can make. Each patient will be challenged to identify changes they are motivated to make in order to overcome their obstacles. This group will be process-oriented, with patients participating in exploration of their own experiences as well as giving and receiving support and challenge from other group members.   Therapeutic Goals: 1. Patient will identify personal and current obstacles as they relate to admission. 2. Patient will identify barriers that currently interfere with their wellness or overcoming obstacles.  3. Patient will identify feelings, thought process and behaviors related to these barriers. 4. Patient will identify two changes they are willing to make to overcome these obstacles:      Summary of Patient Progress      Therapeutic Modalities:   Cognitive Behavioral Therapy Solution Focused Therapy Motivational Interviewing Relapse Prevention Therapy  Glennon MacSara P Jearlean Demauro, LCSW 09/16/2017 4:31 PM

## 2017-09-17 LAB — GLUCOSE, CAPILLARY
GLUCOSE-CAPILLARY: 333 mg/dL — AB (ref 65–99)
GLUCOSE-CAPILLARY: 58 mg/dL — AB (ref 65–99)
GLUCOSE-CAPILLARY: 95 mg/dL (ref 65–99)
Glucose-Capillary: 218 mg/dL — ABNORMAL HIGH (ref 65–99)
Glucose-Capillary: 34 mg/dL — CL (ref 65–99)
Glucose-Capillary: 346 mg/dL — ABNORMAL HIGH (ref 65–99)
Glucose-Capillary: 482 mg/dL — ABNORMAL HIGH (ref 65–99)
Glucose-Capillary: 238 mg/dL — ABNORMAL HIGH (ref 65–99)

## 2017-09-17 MED ORDER — INSULIN GLARGINE 100 UNIT/ML ~~LOC~~ SOLN
36.0000 [IU] | Freq: Every day | SUBCUTANEOUS | Status: DC
  Administered 2017-09-18: 36 [IU] via SUBCUTANEOUS
  Filled 2017-09-17 (×2): qty 0.36

## 2017-09-17 MED ORDER — INSULIN GLARGINE 100 UNIT/ML ~~LOC~~ SOLN: 42.0000 [IU] | Freq: Every day | SUBCUTANEOUS | 11 refills | Status: DC

## 2017-09-17 MED ORDER — BLOOD GLUCOSE MONITOR KIT: PACK | 0 refills | Status: DC

## 2017-09-17 MED ORDER — GABAPENTIN 400 MG PO CAPS: 1200.0000 mg | ORAL_CAPSULE | Freq: Three times a day (TID) | ORAL | 1 refills | Status: DC

## 2017-09-17 MED ORDER — INSULIN ASPART 100 UNIT/ML ~~LOC~~ SOLN
0.0000 [IU] | Freq: Every day | SUBCUTANEOUS | Status: DC
  Administered 2017-09-17: 3 [IU] via SUBCUTANEOUS
  Administered 2017-09-19: 4 [IU] via SUBCUTANEOUS
  Administered 2017-09-20: 5 [IU] via SUBCUTANEOUS
  Administered 2017-09-22: 4 [IU] via SUBCUTANEOUS
  Administered 2017-09-24: 2 [IU] via SUBCUTANEOUS
  Administered 2017-09-24: 5 [IU] via SUBCUTANEOUS
  Filled 2017-09-17 (×3): qty 1

## 2017-09-17 MED ORDER — HALOPERIDOL 5 MG PO TABS: 5.0000 mg | ORAL_TABLET | Freq: Two times a day (BID) | ORAL | 1 refills | Status: DC

## 2017-09-17 MED ORDER — TRAZODONE HCL 100 MG PO TABS
200.0000 mg | ORAL_TABLET | Freq: Every day | ORAL | Status: DC
  Administered 2017-09-17 – 2017-09-25 (×9): 200 mg via ORAL
  Filled 2017-09-17 (×9): qty 2

## 2017-09-17 MED ORDER — INSULIN ASPART 100 UNIT/ML ~~LOC~~ SOLN
0.0000 [IU] | Freq: Three times a day (TID) | SUBCUTANEOUS | Status: DC
  Administered 2017-09-18: 9 [IU] via SUBCUTANEOUS
  Administered 2017-09-18: 2 [IU] via SUBCUTANEOUS
  Administered 2017-09-18: 1 [IU] via SUBCUTANEOUS
  Administered 2017-09-19: 2 [IU] via SUBCUTANEOUS
  Administered 2017-09-19: 7 [IU] via SUBCUTANEOUS
  Administered 2017-09-19 – 2017-09-20 (×2): 3 [IU] via SUBCUTANEOUS
  Administered 2017-09-20: 7 [IU] via SUBCUTANEOUS
  Administered 2017-09-21: 1 [IU] via SUBCUTANEOUS
  Administered 2017-09-21: 2 [IU] via SUBCUTANEOUS
  Administered 2017-09-22: 7 [IU] via SUBCUTANEOUS
  Administered 2017-09-22: 3 [IU] via SUBCUTANEOUS
  Administered 2017-09-23: 5 [IU] via SUBCUTANEOUS
  Administered 2017-09-23: 1 [IU] via SUBCUTANEOUS
  Administered 2017-09-23 – 2017-09-24 (×2): 2 [IU] via SUBCUTANEOUS
  Administered 2017-09-24: 5 [IU] via SUBCUTANEOUS
  Administered 2017-09-25: 2 [IU] via SUBCUTANEOUS
  Filled 2017-09-17 (×10): qty 1

## 2017-09-17 MED ORDER — VENLAFAXINE HCL ER 75 MG PO CP24: 225.0000 mg | ORAL_CAPSULE | Freq: Every day | ORAL | 1 refills | Status: DC

## 2017-09-17 MED ORDER — TRAZODONE HCL 100 MG PO TABS: 100.0000 mg | ORAL_TABLET | Freq: Every evening | ORAL | 2 refills | Status: DC | PRN

## 2017-09-17 MED ORDER — INSULIN ASPART 100 UNIT/ML ~~LOC~~ SOLN: 6.0000 [IU] | Freq: Three times a day (TID) | SUBCUTANEOUS | 11 refills | Status: DC

## 2017-09-17 NOTE — Progress Notes (Signed)
Inpatient Diabetes Program Recommendations  AACE/ADA: New Consensus Statement on Inpatient Glycemic Control (2015)  Target Ranges:  Prepandial:   less than 140 mg/dL      Peak postprandial:   less than 180 mg/dL (1-2 hours)      Critically ill patients:  140 - 180 mg/dL   Lab Results  Component Value Date   GLUCAP 34 (LL) 09/17/2017   HGBA1C 8.7 (H) 09/11/2017    Inpatient Diabetes Program Recommendations:     Recommend Lantus 38 units qhs and Novolog 15 units tid with meals- do not take Novolog if you are not eating the meal.  Although it would be ideal to haver this patient leave the hospital with appropriate supplies, the likelihood of him going to purchase any are remote.  Patient should bring his meter to his next MD visit so appropriate strips and lancets can be given to him. ([facility] and [facility] both have there own meters if his is not appropriate)  Susette RacerJulie Merrin Mcvicker, RN, BA, [place], CDE Diabetes Coordinator Inpatient Diabetes Program  [phone] (Team Pager) [phone] [facility] Office) 09/17/2017 11:38 AM

## 2017-09-17 NOTE — Discharge Summary (Addendum)
Physician Discharge Summary Note  Patient:  Mark [name] is an 31 y.o., male MRN:  [id number] DOB:  Mar 03, 1986 Patient phone:  [phone] (home)  Patient address:   [address] [place] [address],  Total Time spent with patient: 30 minutes  Date of Admission:  09/10/2017 Date of Discharge: 09/26/2017  Reason for Admission:  Overdose  Identifying data. Mark [name] is a 31 year old male with a history of depression.  Chief complaint. "I did not take my medications."  History of present illness. Information was obtained from the patient and the chart. The patient was transferred from ICU where he was admitted for hypoglycemia. We suspected that he overdosed on Insulin but the patient adamantly and angrily denies stating tha to the contrary, he has not been taking medivations. He can not explain how he ended up in the hospital "someone called". He was marginally cooperative on medical floor but since transfer to psychiatry, he refuses food, drink or any medications. He is irritable and uncooperative. He "wants to die". He understands that we will treat him in the hospital even against his will and comments "you got to do what you got to do". He warms up a little as we go on. He reports all symptoms of depression with poor sleep, decreased appetite, anhedonia, feeling of hopelessness and helplessness, poor energy and concentration, crying spells and suicidal ideation. He faces multiple severe stressors. He was recently discharged from [facility] to his boarding house but lost this housing three days later unable to pay the rent. [name] has been staying at the homeless shelter. He has not been compliant with treatment. He denies psychotic symptoms or symptoms suggestive of bipolar mania. Reports severe anxiety. No substance use.  Past psychiatric history. He was diagnosed with bipolar disorder at the age of 20. There were multiple hospitalizations and medication trial. There is history of  treatment noncompliance. There were several suicide attempts including by cutting.   Family psychiatric history. None reported.  Social history. 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 He has no income, insurance, housing or social support. His siter who has been somewhat helpful suffers health problems herself. The patient lost his mother recently. He carries her ashes with him and was making sure that the urn was in his possession on admission.   Principal Problem: Severe recurrent major depression with psychotic features [facility]) Discharge Diagnoses: Patient Active Problem List   Diagnosis Date Noted  . Severe recurrent major depression with psychotic features ([place]) [F33.3] 08/19/2017    Priority: High  . Diabetes ([place]) [E11.9] 09/11/2017    Priority: Medium  . 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 Obstructive hydrocephalus [G91.1] 09/19/2017  . Hypoglycemia [E16.2] 09/09/2017  . MDD (major depressive disorder) [F32.9] 08/22/2017  . Nausea & vomiting [R11.2] 08/18/2017  . Abdominal pain [R10.9] 08/18/2017  . Hyponatremia [E87.1] 08/18/2017  . DKA (diabetic ketoacidoses) ([place]) [E13.10] 11/24/2015  . Diabetic keto-acidosis ([place]) [E13.10] 10/23/2015    Past Medical History:  Past Medical History:  Diagnosis Date  . Diabetes mellitus without complication ([place])   . Hydrocephalus   . Kidney stones     Past Surgical History:  Procedure Laterality Date  . KIDNEY STONE SURGERY     Family History:  Family History  Problem Relation Age of Onset  . Breast cancer Mother    Social History:  Social History   Substance and Sexual Activity  Alcohol Use No   Comment: ocasional  Social History   Substance and Sexual Activity  Drug Use Yes  . Types: Marijuana   Comment: "as often as I can"    Social History   Socioeconomic History  . Marital status: Single    Spouse name: None  . Number of children: None  . Years of education: None  .  Highest education level: None  Social Needs  . Financial resource strain: None  . Food insecurity - worry: None  . Food insecurity - inability: None  . Transportation needs - medical: None  . Transportation needs - non-medical: None  Occupational History  . None  Tobacco Use  . Smoking status: Current Every Day Smoker    Packs/day: 1.00    Types: Cigarettes  . Smokeless tobacco: Never Used  Substance and Sexual Activity  . Alcohol use: No    Comment: ocasional   . Drug use: Yes    Types: Marijuana    Comment: "as often as I can"  . Sexual activity: None  Other Topics Concern  . None  Social History Narrative  . None    Hospital Course:    Mark [name] is a 32 year old male with a history ofMDD, severe with suicidal ideation, type 1 diabetes and treatment noncompliance transferred from ICU where he was hospitalized for hypoglycemia from presumed insulin overdose which the patient denies.  Depressed.The patient is no longer actively suicidal. He has chronic, passive thought of suicide without a plan when overwhelmed with his difficult social situation.  #Suicidal ideation, resoled  #Mood -continue 100 mg of Haldol decanoate injection, next on 10/23/2017 -continue Effexor 300 mg daily -continue Neurontin 1,200 mg TID  #Diabetes, poorly controlled -Lantus38units at night -NovoLog insulin6units thrice daily as with meals, hold if patient does not eat a meal or eats less than 50% of his meal  #Insomnia -Trazodone 100 mg nightly  #Social -patient has no income, health insurance, housing or support -application for SSDI completed in the hospital but waiting time is 3-4 months -patient does not qualify for Medicaid  #Falls, truncal ataxia, hydrocephalus -Head Ct scan revealedSevere obstructive hydrocephalus unchanged from 2013. Findings consistent with aqueductal stenosis. -No intervention recommended by consulting neurosurgeon -patient work with physical therapy  here, they recommended SNF but there is no payer source -rolling walker was provided at discharge  #Disposition -the patient was discharged to the homeless shelter in [place] as local shelter has no beds -follow up at [facility]  Physical Findings: AIMS: Facial and Oral Movements Muscles of Facial Expression: None, normal Lips and Perioral Area: None, normal Jaw: None, normal Tongue: None, normal,Extremity Movements Upper (arms, wrists, hands, fingers): None, normal Lower (legs, knees, ankles, toes): None, normal, Trunk Movements Neck, shoulders, hips: None, normal, Overall Severity Severity of abnormal movements (highest score from questions above): None, normal Incapacitation due to abnormal movements: None, normal Patient's awareness of abnormal movements (rate only patient's report): No Awareness, Dental Status Current problems with teeth and/or dentures?: No Does patient usually wear dentures?: No  CIWA:    COWS:     Musculoskeletal: Strength & Muscle Tone: within normal limits Gait & Station: unsteady, ataxic, broad based Patient leans: N/A  Psychiatric Specialty Exam: Physical Exam  Nursing note and vitals reviewed. Psychiatric: His speech is normal and behavior is normal. Thought content normal. His mood appears anxious. Cognition and memory are normal. He expresses impulsivity.    Review of Systems  Musculoskeletal: Positive for falls.  Neurological: Positive for headaches.  Psychiatric/Behavioral: Positive for depression.  All other  systems reviewed and are negative.   Blood pressure 104/62, pulse 73, temperature 97.9 F (36.6 C), temperature source Oral, resp. rate 20, height 6' 2"  (1.88 m), weight 97.1 kg (214 lb), SpO2 99 %.Body mass index is 27.48 kg/m.  General Appearance: Casual  Eye Contact:  Good  Speech:  Clear and Coherent  Volume:  Normal  Mood:  Anxious  Affect:  Appropriate  Thought Process:  Goal Directed and Descriptions of Associations:  Intact  Orientation:  Full (Time, Place, and Person)  Thought Content:  WDL  Suicidal Thoughts:  No  Homicidal Thoughts:  No  Memory:  Immediate;   Fair Recent;   Fair Remote;   Fair  Judgement:  Impaired  Insight:  Shallow  Psychomotor Activity:  Normal  Concentration:  Concentration: Fair and Attention Span: Fair  Recall:  [employer] of Knowledge:  Fair  Language:  Fair  Akathisia:  No  Handed:  Right  AIMS (if indicated):     Assets:  Communication Skills Desire for Improvement Resilience  ADL's:  Intact  Cognition:  WNL  Sleep:  Number of Hours: 6.3     Have you used any form of tobacco in the last 30 days? (Cigarettes, Smokeless Tobacco, Cigars, and/or Pipes): Yes  Has this patient used any form of tobacco in the last 30 days? (Cigarettes, Smokeless Tobacco, Cigars, and/or Pipes) Yes, Yes, A prescription for an FDA-approved tobacco cessation medication was offered at discharge and the patient refused  Blood Alcohol level:  Lab Results  Component Value Date   [facility] <10 09/08/2017   ETH <10 16/07/9603    Metabolic Disorder Labs:  Lab Results  Component Value Date   HGBA1C 8.7 (H) 09/11/2017   MPG 202.99 09/11/2017   No results found for: PROLACTIN Lab Results  Component Value Date   CHOL 217 (H) 09/11/2017   TRIG 135 09/11/2017   HDL 49 09/11/2017   CHOLHDL 4.4 09/11/2017   VLDL 27 09/11/2017   LDLCALC 141 (H) 09/11/2017   LDLCALC 216 (H) 08/23/2017    See Psychiatric Specialty Exam and Suicide Risk Assessment completed by Attending Physician prior to discharge.  Discharge destination:  Other:  homeless shelter  Is patient on multiple antipsychotic therapies at discharge:  No   Has Patient had three or more failed trials of antipsychotic monotherapy by history:  No  Recommended Plan for Multiple Antipsychotic Therapies: NA  Discharge Instructions    Diet - low sodium heart healthy   Complete by:  As directed    Diet - low sodium heart healthy    Complete by:  As directed    Diet - low sodium heart healthy   Complete by:  As directed    Increase activity slowly   Complete by:  As directed    Increase activity slowly   Complete by:  As directed    Increase activity slowly   Complete by:  As directed      Allergies as of 09/26/2017      Reactions   Penicillins Anaphylaxis, Hives, Swelling   Has patient had a PCN reaction causing immediate rash, facial/tongue/throat swelling, SOB or lightheadedness with hypotension: Yes Has patient had a PCN reaction causing severe rash involving mucus membranes or skin necrosis: No Has patient had a PCN reaction that required hospitalization Yes Has patient had a PCN reaction occurring within the last 10 years: Yes If all of the above answers are "NO", then may proceed with Cephalosporin use.   Sulfa Antibiotics  Anaphylaxis, Hives   Clindamycin/lincomycin Hives      Medication List    STOP taking these medications   mirtazapine 30 MG tablet Commonly known as:  REMERON   zolpidem 10 MG tablet Commonly known as:  AMBIEN     TAKE these medications     Indication  blood glucose meter kit and supplies Kit Dispense based on patient and insurance preference. Use up to four times daily as directed. (FOR ICD-9 250.00, 250.01).  Indication:  type 1 diabetes   gabapentin 400 MG capsule Commonly known as:  NEURONTIN Take 3 capsules (1,200 mg total) by mouth 3 (three) times daily.  Indication:  Neuropathic Pain   haloperidol decanoate 100 MG/ML injection Commonly known as:  HALDOL DECANOATE Inject 1 mL (100 mg total) into the muscle every 28 (twenty-eight) days.  Indication:  Psychosis   insulin aspart 100 UNIT/ML injection Commonly known as:  novoLOG Inject 6 Units into the skin 3 (three) times daily with meals. What changed:    how much to take  additional instructions  Another medication with the same name was removed. Continue taking this medication, and follow the directions  you see here.  Indication:  Insulin-Dependent Diabetes   insulin glargine 100 UNIT/ML injection Commonly known as:  LANTUS Inject 0.38 mLs (38 Units total) into the skin every morning.  Indication:  Insulin-Dependent Diabetes   traZODone 100 MG tablet Commonly known as:  DESYREL Take 1 tablet (100 mg total) by mouth at bedtime as needed for sleep.  Indication:  Trouble Sleeping   venlafaxine XR 150 MG 24 hr capsule Commonly known as:  EFFEXOR-XR Take 2 capsules (300 mg total) by mouth daily with breakfast. What changed:  how much to take  Indication:  Major Depressive Disorder            Durable Medical Equipment  (From admission, onward)        Start     Ordered   09/25/17 1417  For home use only DME Walker rolling  Once    Question:  Patient needs a walker to treat with the following condition  Answer:  Falls   09/25/17 1416     Follow-up Information    [place] Follow up on 09/17/2017.   Why:  Cassie, Interior and spatial designer, at [facility], will pick you up and resume services immediately. Contact information: [place] [address] [place], [place] Follow up on 09/24/2017.   Why:  Your appointment is at 11:40 with Catarina Hartshorn NP. Please bring a copy of your discharge paperwork with you. Contact information: [place] [address] [phone]           Follow-up recommendations:  Activity:  as tolerated Diet:  low sodium heart healthy ADA diet Other:  keep follow up appointment  Comments:    Signed: Orson Slick, MD 09/26/2017, 11:38 AM

## 2017-09-17 NOTE — Progress Notes (Signed)
D:Pt denies AVH and denies HI. Pt. Reports suicidal ideations stating, "I want to kill myself when I discharge". When asked if he had a plan for discharge he stated that, "I want to stab myself in the stomach with a knife". Pt. Contracts for safety saying, "I won't try to do anything here, I feel safe here". Pt is upon interaction presents flat and blunted. Pt. Complains of mismanagement of his diabetic medications. Consult ordered to see patient about diabetes. Pt. Complains about hopelessness. Pt. Reports feeling worse today than yesterday. Pt. During snacks and meal times declines to follow dietary recommendations and consumes excessive carbohydrates spiking blood sugar levels. Pt. Education on dietary recommendations given.   A: Q x 15 minute observation checks were completed for safety. Patient was provided with education. Patient was given ordered medications. Patient  was encourage to attend groups, participate in unit activities and continue with plan of care.   R:Patient is complaint with medication and unit procedures. Pt. Frequently seen in the milieu interacting with peers and socializing attending unit activities.            Patient slept for Estimated Hours of 6; Precautionary checks every 15 minutes for safety maintained, room free of safety hazards, patient sustains no injury or falls during this shift.

## 2017-09-17 NOTE — [facility] Group Notes (Signed)
[facility] Group Notes:  (Nursing/MHT/Case Management/Adjunct)  Date:  09/17/2017  Time:  9:08 PM  Type of Therapy:  Psychoeducational Skills  Participation Level:  Active  Participation Quality:  Appropriate and Attentive  Affect:  Anxious and Appropriate  Cognitive:  Appropriate  Insight:  Appropriate and Good  Engagement in Group:  Engaged  Modes of Intervention:  Discussion and Education  Summary of Progress/Problems:  Judene CompanionMary  Elania Crowl 09/17/2017, 9:08 PM

## 2017-09-17 NOTE — Progress Notes (Signed)
Recreation Therapy Notes          Mark [name] 09/17/2017 4:40 PM

## 2017-09-17 NOTE — Progress Notes (Signed)
[facility]  09/17/2017 2:52 PM Mark [name]  MRN:  [id number]  Subjective:   [name] had a long conversation with me last night to discuss discharge plan. He was accepted to [facility] homeless shelter, his only housing option. He completed interview with the staff member at the shelter and agreed with the plan. In the afternoon however, he refused to go explaining that he will not be able to apply for disability as this is a "workEconomist" program. When explained that this is not a good reason to stay in the hospital and that he may lose housing opportunity he stated cursing, yelling, calling names. He states that he does not care anymore and will kill himself if discharged. This morning, he took Novolog but did not eat breakfast causing his sugar to drop.  Treatment plan. We will not discharge today. We will continue current regimen. Treatment team meeting tomorrow to formulate a plan for this patient who is malingering.  Social/disposition. It is unlijkely that the patient will be accepted to the shelter. He is out of options.  Principal Problem: Severe recurrent major depression without psychotic features (HCC) Diagnosis:   Patient Active Problem List   Diagnosis Date Noted  . Diabetes (HCC) [E11.9] 09/11/2017  . Hypoglycemia [E16.2] 09/09/2017  . MDD (major depressive disorder) [F32.9] 08/22/2017  . Severe recurrent major depression without psychotic features (HCC) [F33.2] 08/19/2017  . Nausea & vomiting [R11.2] 08/18/2017  . Abdominal pain [R10.9] 08/18/2017  . Hyponatremia [E87.1] 08/18/2017  . DKA (diabetic ketoacidoses) (HCC) [E13.10] 11/24/2015  . Diabetic keto-acidosis (HCC) [E13.10] 10/23/2015   Total Time spent with patient: 30 minutes  Past Psychiatric History: mood instability  Past Medical History:  Past Medical History:  Diagnosis Date  . Diabetes mellitus without complication (HCC)   . Hydrocephalus   . Kidney stones     Past Surgical  History:  Procedure Laterality Date  . KIDNEY STONE SURGERY     Family History:  Family History  Problem Relation Age of Onset  . Breast cancer Mother    Family Psychiatric  History: bipolar disorder Social History:  Social History   Substance and Sexual Activity  Alcohol Use No   Comment: ocasional      Social History   Substance and Sexual Activity  Drug Use Yes  . Types: Marijuana   Comment: "as often as I can"    Social History   Socioeconomic History  . Marital status: Single    Spouse name: None  . Number of children: None  . Years of education: None  . Highest education level: None  Social Needs  . Financial resource strain: None  . Food insecurity - worry: None  . Food insecurity - inability: None  . Transportation needs - medical: None  . Transportation needs - non-medical: None  Occupational History  . None  Tobacco Use  . Smoking status: Current Every Day Smoker    Packs/day: 1.00    Types: Cigarettes  . Smokeless tobacco: Never Used  Substance and Sexual Activity  . Alcohol use: No    Comment: ocasional   . Drug use: Yes    Types: Marijuana    Comment: "as often as I can"  . Sexual activity: None  Other Topics Concern  . None  Social History Narrative  . None   Additional Social History:  Sleep: Fair  Appetite:  Fair  Current Medications: Current Facility-Administered Medications  Medication Dose Route Frequency Provider Last Rate Last Dose  . acetaminophen (TYLENOL) tablet 650 mg  650 mg Oral Q6H PRN Clapacs, [name] T, MD      . alum & mag hydroxide-simeth (MAALOX/MYLANTA) 200-200-20 MG/5ML suspension 30 mL  30 mL Oral Q4H PRN Clapacs, [name] T, MD      . divalproex (DEPAKOTE) DR tablet 500 mg  500 mg Oral Q8H Lantz Hermann B, MD   500 mg at 09/17/17 0602  . feeding supplement (GLUCERNA SHAKE) (GLUCERNA SHAKE) liquid 237 mL  237 mL Oral TID BM Geremy Rister B, MD   237 mL at 09/16/17 2117  .  gabapentin (NEURONTIN) capsule 1,200 mg  1,200 mg Oral TID Manuela Halbur B, MD   1,200 mg at 09/17/17 1254  . haloperidol (HALDOL) tablet 5 mg  5 mg Oral BID Brenda Samano B, MD   5 mg at 09/17/17 0747  . hydrOXYzine (ATARAX/VISTARIL) tablet 25 mg  25 mg Oral Q4H PRN Aundria Rud, MD   25 mg at 09/16/17 1708  . Influenza vac split quadrivalent PF (FLUARIX) injection 0.5 mL  0.5 mL Intramuscular Tomorrow-1000 Art Levan B, MD      . insulin aspart (novoLOG) injection 6 Units  6 Units Subcutaneous TID WC Aundria Rud, MD   6 Units at 09/17/17 0748  . insulin glargine (LANTUS) injection 42 Units  42 Units Subcutaneous Daily Milagros Loll, MD   42 Units at 09/17/17 0748  . magnesium hydroxide (MILK OF MAGNESIA) suspension 30 mL  30 mL Oral Daily PRN Clapacs, [name] T, MD      . mirtazapine (REMERON) tablet 30 mg  30 mg Oral QHS Clapacs, Jackquline Denmark, MD   30 mg at 09/16/17 2103  . nicotine (NICODERM CQ - dosed in mg/24 hours) patch 21 mg  21 mg Transdermal Daily Letcher Schweikert B, MD   21 mg at 09/17/17 0749  . polyethylene glycol (MIRALAX / GLYCOLAX) packet 17 g  17 g Oral Daily PRN Clapacs, [name] T, MD      . traZODone (DESYREL) tablet 100 mg  100 mg Oral QHS Aundria Rud, MD   100 mg at 09/16/17 2103  . venlafaxine XR (EFFEXOR-XR) 24 hr capsule 225 mg  225 mg Oral Q breakfast Aundria Rud, MD   225 mg at 09/17/17 0747  . zolpidem (AMBIEN) tablet 10 mg  10 mg Oral QHS Shavelle Runkel B, MD   10 mg at 09/16/17 2103    Lab Results:  Results for orders placed or performed during the hospital encounter of 09/10/17 (from the past 48 hour(s))  Glucose, capillary     Status: Abnormal   Collection Time: 09/15/17  4:25 PM  Result Value Ref Range   Glucose-Capillary 406 (H) 65 - 99 mg/dL   Comment 1 Notify RN    Comment 2 Document in Chart   Glucose, capillary     Status: Abnormal   Collection Time: 09/15/17  8:51 PM  Result Value Ref Range    Glucose-Capillary 323 (H) 65 - 99 mg/dL  Glucose, capillary     Status: Abnormal   Collection Time: 09/15/17 11:36 PM  Result Value Ref Range   Glucose-Capillary 392 (H) 65 - 99 mg/dL  Glucose, capillary     Status: Abnormal   Collection Time: 09/16/17  6:34 AM  Result Value Ref Range   Glucose-Capillary 226 (H) 65 - 99 mg/dL  Valproic acid level  Status: None   Collection Time: 09/16/17  7:31 AM  Result Value Ref Range   Valproic Acid Lvl 93 50.0 - 100.0 ug/mL  Glucose, capillary     Status: Abnormal   Collection Time: 09/16/17 11:24 AM  Result Value Ref Range   Glucose-Capillary 38 (LL) 65 - 99 mg/dL  Glucose, capillary     Status: Abnormal   Collection Time: 09/16/17 12:28 PM  Result Value Ref Range   Glucose-Capillary 132 (H) 65 - 99 mg/dL  Glucose, capillary     Status: Abnormal   Collection Time: 09/16/17  4:20 PM  Result Value Ref Range   Glucose-Capillary 228 (H) 65 - 99 mg/dL   Comment 1 Notify RN   Glucose, capillary     Status: Abnormal   Collection Time: 09/16/17  8:49 PM  Result Value Ref Range   Glucose-Capillary 246 (H) 65 - 99 mg/dL   Comment 1 Notify RN    Comment 2 Document in Chart   Glucose, capillary     Status: Abnormal   Collection Time: 09/17/17  1:21 AM  Result Value Ref Range   Glucose-Capillary 333 (H) 65 - 99 mg/dL  Glucose, capillary     Status: Abnormal   Collection Time: 09/17/17  7:03 AM  Result Value Ref Range   Glucose-Capillary 218 (H) 65 - 99 mg/dL  Glucose, capillary     Status: Abnormal   Collection Time: 09/17/17 11:01 AM  Result Value Ref Range   Glucose-Capillary 39 (LL) 65 - 99 mg/dL   Comment 1 Repeat Test   Glucose, capillary     Status: Abnormal   Collection Time: 09/17/17 11:07 AM  Result Value Ref Range   Glucose-Capillary 34 (LL) 65 - 99 mg/dL   Comment 1 Document in Chart    Comment 2 Repeat Test   Glucose, capillary     Status: Abnormal   Collection Time: 09/17/17 11:30 AM  Result Value Ref Range    Glucose-Capillary 58 (L) 65 - 99 mg/dL  Glucose, capillary     Status: None   Collection Time: 09/17/17 11:43 AM  Result Value Ref Range   Glucose-Capillary 95 65 - 99 mg/dL    Blood Alcohol level:  Lab Results  Component Value Date   ETH <10 09/08/2017   ETH <10 08/18/2017    Metabolic Disorder Labs: Lab Results  Component Value Date   HGBA1C 8.7 (H) 09/11/2017   MPG 202.99 09/11/2017   No results found for: PROLACTIN Lab Results  Component Value Date   CHOL 217 (H) 09/11/2017   TRIG 135 09/11/2017   HDL 49 09/11/2017   CHOLHDL 4.4 09/11/2017   VLDL 27 09/11/2017   LDLCALC 141 (H) 09/11/2017   LDLCALC 216 (H) 08/23/2017    Physical Findings: AIMS: Facial and Oral Movements Muscles of Facial Expression: None, normal Lips and Perioral Area: None, normal Jaw: None, normal Tongue: None, normal,Extremity Movements Upper (arms, wrists, hands, fingers): None, normal Lower (legs, knees, ankles, toes): None, normal, Trunk Movements Neck, shoulders, hips: None, normal, Overall Severity Severity of abnormal movements (highest score from questions above): None, normal Incapacitation due to abnormal movements: None, normal Patient's awareness of abnormal movements (rate only patient's report): No Awareness, Dental Status Current problems with teeth and/or dentures?: No Does patient usually wear dentures?: No  CIWA:    COWS:     Musculoskeletal: Strength & Muscle Tone: within normal limits Gait & Station: normal Patient leans: N/A  Psychiatric Specialty Exam: Physical Exam  Nursing note  and vitals reviewed. Psychiatric: His affect is angry and labile. His speech is rapid and/or pressured. He is agitated. Cognition and memory are normal. He expresses impulsivity. He expresses suicidal ideation.    Review of Systems  Neurological: Negative.   Psychiatric/Behavioral: Negative.   All other systems reviewed and are negative.   Blood pressure 128/71, pulse 71, temperature  98.4 F (36.9 C), temperature source Oral, resp. rate 16, height 6\' 2"  (1.88 m), weight 97.1 kg (214 lb), SpO2 100 %.Body mass index is 27.48 kg/m.  General Appearance: Fairly Groomed  Eye Contact:  Good  Speech:  Pressured  Volume:  Increased  Mood:  Angry and Irritable  Affect:  Congruent  Thought Process:  Goal Directed and Descriptions of Associations: Intact  Orientation:  Full (Time, Place, and Person)  Thought Content:  WDL  Suicidal Thoughts:  Yes.  with intent/plan  Homicidal Thoughts:  No  Memory:  Immediate;   Fair Recent;   Fair Remote;   Fair  Judgement:  Impaired  Insight:  Lacking  Psychomotor Activity:  Normal  Concentration:  Concentration: Fair and Attention Span: Fair  Recall:  [employer] of Knowledge:  Fair  Language:  Fair  Akathisia:  No  Handed:  Right  AIMS (if indicated):     Assets:  Communication Skills Desire for Improvement Resilience  ADL's:  Intact  Cognition:  WNL  Sleep:  Number of Hours: 6.15     Treatment Plan Summary: Daily contact with patient to assess and evaluate symptoms and progress in treatment and Medication management   Mark [name] is a 31 year old male with a history ofMDD, severe with suicidal ideation, type 1 diabetes and treatment noncompliance transferred from ICU where he was hospitalized for severe hypoglycemia from presumed insulin overdose.  #Suicidal ideation -the patient threatens to kill himself if discharged -able to contract for safety in the hospital  #Mood -continue Haldol 5 mg BID -continue Depakote 500 mg TID, VPA level 93, check ammonia level -ContinueEffexor dose at225 mg  -Continue Remeron 30 mg nightly -if discharged to the homeless shelter he may only take 2 psychotropic medications  #Diabetes, poorly controlled -Lantus decresed to 36 units, ADA diet, SSI, blood glucose monitoring, BG low this morning -Appreciate recommendations from diabetes coordinator. -NovoLog insulin6units thrice  daily as scheduled, hold if patient does not eat a meal or eats less than 50% of his meal  #Insomnia -Trazodone 100 mg nightly  #Social -patient has no income, health insurance, housing or support  #R/O malingering -will meet with treatment team to formulate a plan  #Disposition -[facility]        Kristine Linea, MD 09/17/2017, 2:52 PM

## 2017-09-17 NOTE — B[facility] Group Notes (Signed)
[facility] Group Notes:  (Nursing/MHT/Case Management/Adjunct)  Date:  09/17/2017  Time:  4:03 AM  Type of Therapy:  Psychoeducational Skills  Participation Level:  Active  Participation Quality:  Appropriate  Affect:  Appropriate  Cognitive:  Appropriate  Insight:  Appropriate  Engagement in Group:  Limited  Modes of Intervention:  Discussion, Socialization and Support  Summary of Progress/Problems:  Mark SingerJustin  Mark [name] 09/17/2017, 4:03 AM

## 2017-09-17 NOTE — Plan of Care (Signed)
Pt. Sleeping better per sleep reports. Pt. Able to remain safe while on the unit. Pt. Able to contract for safety stating he can remain safe on the unit. Pt. Reports Emotional status and mental statuses are not progressing. Pt. Reports feeling worse today. Pt. Reports SI with plan for discharge. Pt. Reports, "I want to stab myself in the stomach with a knife".

## 2017-09-17 NOTE — Progress Notes (Signed)
Inpatient Diabetes Program Recommendations  AACE/ADA: New Consensus Statement on Inpatient Glycemic Control (2015)  Target Ranges:  Prepandial:   less than 140 mg/dL      Peak postprandial:   less than 180 mg/dL (1-2 hours)      Critically ill patients:  140 - 180 mg/dL   Lab Results  Component Value Date   GLUCAP 218 (H) 09/17/2017   HGBA1C 8.7 (H) 09/11/2017    Review of Glycemic Control  Results for Mark [name], Mark [name] (MRN [id number]) as of 09/17/2017 08:04  Ref. Range 09/16/2017 12:28 09/16/2017 16:20 09/16/2017 20:49 09/17/2017 01:21 09/17/2017 07:03  Glucose-Capillary Latest Ref Range: 65 - 99 mg/dL 409132 (H) 811228 (H) 914246 (H) 333 (H) 218 (H)   Home DM Meds: Lantus 38 units QHS                             Novolog 15 units TID  Current Insulin Orders: Lantus 42 units daily                                        Novolog 6 units TID    Low blood sugar yesterday- likely because he only ate 50% of his meal and received meal coverage insulin (should be held if patient eats 50% or less)  Please consider the following adjustments:  1. Start back Novolog Sensitive Correction Scale/ SSI (0-9 units) TID AC + Novolog 0-5 units QHS (Use Glycemic Control Order set)  2. Please be sure staff are giving insulin appropriately- following guidelines.  If sliding scale (correction insulin) is being given- it must be given within 1 hour of the blood sugar being checked), meal time Novolog should only be given if he eats more than 50%.  Susette Racer.Caprisha Bridgett, RN, BA, MHA, CDE Diabetes Coordinator Inpatient Diabetes Program  [phone] (Team Pager) [phone] [facility] Office) 09/17/2017 8:07 AM

## 2017-09-17 NOTE — Progress Notes (Addendum)
Patient was found sitting on the floor after an unwitnessed fall. Patient states that "I was getting up to go get a snack and I fell". Patient reports that he didn't hit his head, patient states "I fell on my tail bone" and rates his pain level as a "5/10" but does not request any pain medication from this writer at this time. Patient states that "I'm fine, I don't feel dizzy, my tail bone just hurts". Gait remains unsteady and staff assisted him into a wheelchair. Patient was given instructions to call staff for his needs. Dr. Jennet MaduroPucilowska, MD was notified on 09/17/2017 at 11:15 am. Patient vital signs after fall were: blood pressure 122/74, temperature 98 degrees Farenheit, respirations 17, oxygen saturation 100% on room air, pulse rate 104 and CBG 39. CBG was taken again and patient's CBG had dropped to 34. Patient reports that he did not eat breakfast this morning after being given his morning dose of insulin. Patient was given carbohydrates (graham crackers, peanut butter, orange juice) and CBG was taken after waiting fifteen minutes and results were as follows: 58. More carbohydrates (banana, orange juice) were given to the patient and after fifteen minutes of waiting CBG was taken again and the results were as follows: 95.

## 2017-09-17 NOTE — Progress Notes (Addendum)
Pt. At 0120 found in his room confused and appearing disoriented. This Clinical research associatewriter had the patient take a seat as vital signs were gathered along with blood sugar CBG to make sure patient was stable. Pt. Vital signs WDL and blood sugar not hypoglycemic. Pt. Urinated on himself attempting to use the bathroom and this writer with BHT performed linen changes and helped the patient change and put on fresh cloths. This Clinical research associatewriter performed orientation assessment and confirmed Alert and oriented x4 with no pain. Pt. Denies pain. Will continue to monitor.

## 2017-09-17 NOTE — Progress Notes (Signed)
Recreation Therapy Notes    Date: 11.27.18  Time: 1:00 pm  Location: Craft Room  Behavioral response: N/A  Intervention Topic: Self- esteem  Discussion/Intervention: Patient did not attend group.   Clinical Observations/Feedback:  Patient did not attend group. Yaslene Lindamood LRT/CTRS      Abelardo Seidner 09/17/2017 2:19 PM

## 2017-09-17 NOTE — BHH Group Notes (Signed)
09/17/2017 9:30am  Type of Therapy/Topic:  Group Therapy:  Feelings about Diagnosis  Participation Level:  Did Not Attend   Description of Group:   This group will allow patients to explore their thoughts and feelings about diagnoses they have received. Patients will be guided to explore their level of understanding and acceptance of these diagnoses. Facilitator will encourage patients to process their thoughts and feelings about the reactions of others to their diagnosis and will guide patients in identifying ways to discuss their diagnosis with significant others in their lives. This group will be process-oriented, with patients participating in exploration of their own experiences, giving and receiving support, and processing challenge from other group members.   Therapeutic Goals: 1. Patient will demonstrate understanding of diagnosis as evidenced by identifying two or more symptoms of the disorder 2. Patient will be able to express two feelings regarding the diagnosis 3. Patient will demonstrate their ability to communicate their needs through discussion and/or role play  Summary of Patient Progress:  Did not attend    Therapeutic Modalities:   Cognitive Behavioral Therapy Brief Therapy Feelings Identification    Mark ShearsCassandra  Tedrick Port, LCSW 09/17/2017 10:34 AM

## 2017-09-17 NOTE — [facility] Suicide Risk Assessment (Signed)
[facility]   Principal Problem: Severe recurrent major depression without psychotic features [facility](HCC) Discharge Diagnoses:  Patient Active Problem List   Diagnosis Date Noted  . Diabetes (HCC) [E11.9] 09/11/2017  . Hypoglycemia [E16.2] 09/09/2017  . MDD (major depressive disorder) [F32.9] 08/22/2017  . Severe recurrent major depression without psychotic features (HCC) [F33.2] 08/19/2017  . Nausea & vomiting [R11.2] 08/18/2017  . Abdominal pain [R10.9] 08/18/2017  . Hyponatremia [E87.1] 08/18/2017  . DKA (diabetic ketoacidoses) (HCC) [E13.10] 11/24/2015  . Diabetic keto-acidosis (HCC) [E13.10] 10/23/2015    Total Time spent with patient: 30 minutes  Musculoskeletal: Strength & Muscle Tone: within normal limits Gait & Station: normal Patient leans: N/A  Psychiatric Specialty Exam: Review of Systems  Neurological: Negative.   Psychiatric/Behavioral: Positive for depression.  All other systems reviewed and are negative.   Blood pressure (!) 93/53, pulse 70, temperature 97.8 F (36.6 C), temperature source Oral, resp. rate 16, height 6\' 2"  (1.88 m), weight 97.1 kg (214 lb), SpO2 98 %.Body mass index is 27.48 kg/m.  General Appearance: Fairly Groomed  Patent attorneyye Contact::  Good  Speech:  Clear and Coherent409  Volume:  Normal  Mood:  Depressed  Affect:  Blunt  Thought Process:  Goal Directed and Descriptions of Associations: Intact  Orientation:  Full (Time, Place, and Person)  Thought Content:  WDL  Suicidal Thoughts:  No  Homicidal Thoughts:  No  Memory:  Immediate;   Fair Recent;   Fair Remote;   Fair  Judgement:  Impaired  Insight:  Shallow  Psychomotor Activity:  Normal  Concentration:  Fair  Recall:  [employer]Fair  Fund of Knowledge:Fair  Language: Fair  Akathisia:  No  Handed:  Right  AIMS (if indicated):     Assets:  Communication Skills Desire for Improvement Resilience  Sleep:  Number of Hours: 6.15  Cognition: WNL  ADL's:  Intact   Mental  Status Per Nursing Assessment::   On Admission:     Demographic Factors:  Male, Caucasian, Low socioeconomic status and Unemployed  Loss Factors: Loss of significant relationship, Decline in physical health and Financial problems/change in socioeconomic status  Historical Factors: Prior suicide attempts, Family history of mental illness or substance abuse and Impulsivity  Risk Reduction Factors:   Sense of responsibility to family and Positive therapeutic relationship  Continued Clinical Symptoms:  Depression:   Severe Previous Psychiatric Diagnoses and Treatments Medical Diagnoses and Treatments/Surgeries  Cognitive Features That Contribute To Risk:  None    Suicide Risk:  Minimal: No identifiable suicidal ideation.  Patients presenting with no risk factors but with morbid ruminations; may be classified as minimal risk based on the severity of the depressive symptoms  Follow-up Information    [facility] Follow up on 09/17/2017.   Why:  Cassie, Office managereer Support Services, at [facility], will pick you up and resume services immediately. Contact information: [address] [place] [address] [phone]           Plan Of Care/Follow-up recommendations:  Activity:  as tolerated Diet:  heart healthy ADA diet Other:  keep follow up appointments  Kristine LineaJolanta Damieon Armendariz, MD 09/17/2017, 10:40 AM

## 2017-09-17 NOTE — Progress Notes (Signed)
  [facility] :  Will you be returning to the same living situation after discharge:  No. Pt will discharge to [facility].  At discharge, do you have transportation home?: Yes,  Pt's peer support specialist, Cassie, from [facility] is picking pt up upon discharge. Do you have the ability to pay for your medications: Yes,  [facility]  Release of information consent forms completed and in the chart;  Patient's signature needed at discharge.  Patient to Follow up at: Follow-up Information    [facility] Follow up on 09/17/2017.   Why:  Cassie, Office managereer Support Services[facility] at [facility], will pick you up and resume services immediately. Contact information: [address] [place] [address] [phone]           Next level of care provider [facility] Link:no  Safety Planning and Suicide Prevention discussed: Yes,  with pt. Pt declined to involve other supports.  Have you used any form of tobacco in the last 30 days? (Cigarettes, Smokeless Tobacco, Cigars, and/or Pipes): Yes  Has patient been referred to the Quitline?: Patient refused referral  Patient has been referred for addiction treatment: Yes, referred to [facility].  Heidi DachKelsey Ceniya Fowers, LCSW 09/17/2017, 10:30 AM

## 2017-09-17 NOTE — [facility] Group Notes (Signed)
LCSW Group Therapy Note 09/17/2017 9:00am  Type of Therapy and Topic:  Group Therapy:  Setting Goals  Participation Level:  Did Not Attend  Description of Group: In this process group, patients discussed using strengths to work toward goals and address challenges.  Patients identified two positive things about themselves and one goal they were working on.  Patients were given the opportunity to share openly and support each other's plan for self-empowerment.  The group discussed the value of gratitude and were encouraged to have a daily reflection of positive characteristics or circumstances.  Patients were encouraged to identify a plan to utilize their strengths to work on current challenges and goals.  Therapeutic Goals 1. Patient will verbalize personal strengths/positive qualities and relate how these can assist with achieving desired personal goals 2. Patients will verbalize affirmation of peers plans for personal change and goal setting 3. Patients will explore the value of gratitude and positive focus as related to successful achievement of goals 4. Patients will verbalize a plan for regular reinforcement of personal positive qualities and circumstances.  Summary of Patient Progress:       Therapeutic Modalities Cognitive Behavioral Therapy Motivational Interviewing    Glennon MacSara P Gloriana Piltz, LCSW 09/17/2017 12:27 PM

## 2017-09-17 NOTE — Progress Notes (Signed)
CSW contacted pt's peer specialist, Cassie with [facility] at  [phone] to inform her that pt's discharge has been postponed due to pt's behavior. Cassie expressed understanding of this information and reported that she would be coming to see pt tonight during visiting hours. CSW will continue to coordinate with pt's Peer Specialist as needed.   CSW also cotnacted Courtney HeysWilliam Green at [facility] at [phone] to inform him that pt would not discharged today. Mr. Chilton SiGreen reported, "I can't promise he's going to have a bed tomorrow. I was ready for him to come today." CSW expressed understanding and will continue to coordinate with Courtney HeysWilliam Green as needed for further discharge planning.   Heidi DachKelsey Assyria Morreale, MSW, LCSW 09/17/2017 3:12 PM

## 2017-09-17 NOTE — Progress Notes (Signed)
App[facility] for this patient  For 09/24/17 @ 11:40 pm. Added to AV summary.

## 2017-09-18 LAB — GLUCOSE, CAPILLARY
GLUCOSE-CAPILLARY: 158 mg/dL — AB (ref 65–99)
GLUCOSE-CAPILLARY: 197 mg/dL — AB (ref 65–99)
Glucose-Capillary: 39 mg/dL — CL (ref 65–99)
Glucose-Capillary: 138 mg/dL — ABNORMAL HIGH (ref 65–99)
Glucose-Capillary: 369 mg/dL — ABNORMAL HIGH (ref 65–99)
Glucose-Capillary: 174 mg/dL — ABNORMAL HIGH (ref 65–99)

## 2017-09-18 NOTE — Tx Team (Signed)
Interdisciplinary Treatment and Diagnostic Plan Update  09/18/2017 Time of Session: 1030 Mark [name] MRN: [id number]  Principal Diagnosis: Severe recurrent major depression without psychotic features [facility])  Secondary Diagnoses: Principal Problem:   Severe recurrent major depression without psychotic features ([place]) Active Problems:   Diabetes ([place])   Current Medications:  Current Facility-Administered Medications  Medication Dose Route Frequency Provider Last Rate Last Dose  . acetaminophen (TYLENOL) tablet 650 mg  650 mg Oral Q6H PRN Clapacs, Madie Reno, MD   650 mg at 09/17/17 2127  . alum & mag hydroxide-simeth (MAALOX/MYLANTA) 200-200-20 MG/5ML suspension 30 mL  30 mL Oral Q4H PRN Clapacs, [name] T, MD      . divalproex (DEPAKOTE) DR tablet 500 mg  500 mg Oral Q8H Pucilowska, Jolanta B, MD   500 mg at 09/18/17 0859  . feeding supplement (GLUCERNA SHAKE) (GLUCERNA SHAKE) liquid 237 mL  237 mL Oral TID BM Pucilowska, Jolanta B, MD   237 mL at 09/17/17 2000  . gabapentin (NEURONTIN) capsule 1,200 mg  1,200 mg Oral TID Pucilowska, Jolanta B, MD   1,200 mg at 09/18/17 0900  . haloperidol (HALDOL) tablet 5 mg  5 mg Oral BID Pucilowska, Jolanta B, MD   5 mg at 09/18/17 0900  . hydrOXYzine (ATARAX/VISTARIL) tablet 25 mg  25 mg Oral Q4H PRN Ramond Dial, MD   25 mg at 09/16/17 1708  . Influenza vac split quadrivalent PF (FLUARIX) injection 0.5 mL  0.5 mL Intramuscular Tomorrow-1000 Pucilowska, Jolanta B, MD      . insulin aspart (novoLOG) injection 0-5 Units  0-5 Units Subcutaneous QHS Pucilowska, Jolanta B, MD   3 Units at 09/17/17 2130  . insulin aspart (novoLOG) injection 0-9 Units  0-9 Units Subcutaneous TID WC Pucilowska, Jolanta B, MD   1 Units at 09/18/17 0859  . insulin aspart (novoLOG) injection 6 Units  6 Units Subcutaneous TID WC Ramond Dial, MD   6 Units at 09/18/17 0900  . insulin glargine (LANTUS) injection 36 Units  36 Units Subcutaneous QHS Pucilowska, Jolanta B, MD       . magnesium hydroxide (MILK OF MAGNESIA) suspension 30 mL  30 mL Oral Daily PRN Clapacs, [name] T, MD      . mirtazapine (REMERON) tablet 30 mg  30 mg Oral QHS Clapacs, [name] T, MD   30 mg at 09/17/17 2127  . nicotine (NICODERM CQ - dosed in mg/24 hours) patch 21 mg  21 mg Transdermal Daily Pucilowska, Jolanta B, MD   21 mg at 09/18/17 0900  . polyethylene glycol (MIRALAX / GLYCOLAX) packet 17 g  17 g Oral Daily PRN Clapacs, [name] T, MD      . traZODone (DESYREL) tablet 200 mg  200 mg Oral QHS Pucilowska, Jolanta B, MD   200 mg at 09/17/17 2127  . venlafaxine XR (EFFEXOR-XR) 24 hr capsule 225 mg  225 mg Oral Q breakfast Ramond Dial, MD   225 mg at 09/18/17 0900   PTA Medications: Medications Prior to Admission  Medication Sig Dispense Refill Last Dose  . insulin aspart (NOVOLOG) 100 UNIT/ML injection Inject 0-9 Units 3 (three) times daily with meals into the skin. Sliding Scale CBG 70 - 120: 0 units  CBG 121 - 150: 1 unit  CBG 151 - 200: 2 units  CBG 201 - 250: 3 units  CBG 251 - 300: 5 units  CBG 301 - 350: 7 units  CBG 351 - 400 9 units 10 mL 0 PRN at PRN  . insulin  aspart (NOVOLOG) 100 UNIT/ML injection Inject 0-5 Units at bedtime into the skin. Bedtime Sliding Scale  CBG 70 - 120: 0 units  CBG 121 - 150: 0 units  CBG 151 - 200: 0 units  CBG 201 - 250: 2 units  CBG 251 - 300: 3 units  CBG 301 - 350: 4 units  CBG 351 - 400: 5 units (Patient not taking: Reported on 09/09/2017) 10 mL 0 Not Taking at Unknown time  . insulin aspart (NOVOLOG) 100 UNIT/ML injection Inject 15 Units 3 (three) times daily with meals into the skin. Do not take if you eat less than 50% of you meal (Patient not taking: Reported on 09/09/2017) 13.5 mL 0 Not Taking at Unknown time  . insulin glargine (LANTUS) 100 UNIT/ML injection Inject 0.38 mLs (38 Units total) into the skin every morning. 11.4 mL 0   . mirtazapine (REMERON) 30 MG tablet Take 1 tablet (30 mg total) at bedtime by mouth. 30 tablet 2  09/08/2017 at 2000  . venlafaxine XR (EFFEXOR-XR) 150 MG 24 hr capsule Take 1 capsule (150 mg total) daily with breakfast by mouth. 30 capsule 2 09/08/2017 at 0800  . zolpidem (AMBIEN) 10 MG tablet Take 1 tablet (10 mg total) at bedtime by mouth. (Patient not taking: Reported on 09/09/2017) 30 tablet 1 Completed Course at Unknown time    Patient Stressors: Health problems Loss of Mother Medication change or noncompliance Substance abuse  Patient Strengths: Average or above average intelligence Capable of independent living  Treatment Modalities: Medication Management, Group therapy, Case management,  1 to 1 session with clinician, Psychoeducation, Recreational therapy.   Physician Treatment Plan for Primary Diagnosis: Severe recurrent major depression without psychotic features ([place]) Long Term Goal(s): Improvement in symptoms so as ready for discharge Improvement in symptoms so as ready for discharge   Short Term Goals: Ability to identify changes in lifestyle to reduce recurrence of condition will improve Ability to verbalize feelings will improve Ability to disclose and discuss suicidal ideas Ability to demonstrate self-control will improve Ability to identify and develop effective coping behaviors will improve Ability to maintain clinical measurements within normal limits will improve Compliance with prescribed medications will improve Ability to identify triggers associated with substance abuse/mental health issues will improve NA  Medication Management: Evaluate patient's response, side effects, and tolerance of medication regimen.  Therapeutic Interventions: 1 to 1 sessions, Unit Group sessions and Medication administration.  Evaluation of Outcomes: Not Met  Physician Treatment Plan for Secondary Diagnosis: Principal Problem:   Severe recurrent major depression without psychotic features ([place]) Active Problems:   Diabetes ([place])  Long Term Goal(s): Improvement in  symptoms so as ready for discharge Improvement in symptoms so as ready for discharge   Short Term Goals: Ability to identify changes in lifestyle to reduce recurrence of condition will improve Ability to verbalize feelings will improve Ability to disclose and discuss suicidal ideas Ability to demonstrate self-control will improve Ability to identify and develop effective coping behaviors will improve Ability to maintain clinical measurements within normal limits will improve Compliance with prescribed medications will improve Ability to identify triggers associated with substance abuse/mental health issues will improve NA     Medication Management: Evaluate patient's response, side effects, and tolerance of medication regimen.  Therapeutic Interventions: 1 to 1 sessions, Unit Group sessions and Medication administration.  Evaluation of Outcomes: Not Met   RN Treatment Plan for Primary Diagnosis: Severe recurrent major depression without psychotic features ([place]) Long Term Goal(s): Knowledge of disease and  therapeutic regimen to maintain health will improve  Short Term Goals: Ability to verbalize feelings will improve, Ability to identify and develop effective coping behaviors will improve and Compliance with prescribed medications will improve  Medication Management: RN will administer medications as ordered by provider, will assess and evaluate patient's response and provide education to patient for prescribed medication. RN will report any adverse and/or side effects to prescribing provider.  Therapeutic Interventions: 1 on 1 counseling sessions, Psychoeducation, Medication administration, Evaluate responses to treatment, Monitor vital signs and CBGs as ordered, Perform/monitor CIWA, COWS, AIMS and Fall Risk screenings as ordered, Perform wound care treatments as ordered.  Evaluation of Outcomes: Not Met   LCSW Treatment Plan for Primary Diagnosis: Severe recurrent major depression  without psychotic features ([place]) Long Term Goal(s): Safe transition to appropriate next level of care at discharge, Engage patient in therapeutic group addressing interpersonal concerns.  Short Term Goals: Engage patient in aftercare planning with referrals and resources, Increase social support and Increase skills for wellness and recovery  Therapeutic Interventions: Assess for all discharge needs, 1 to 1 time with Social worker, Explore available resources and support systems, Assess for adequacy in community support network, Educate family and significant other(s) on suicide prevention, Complete Psychosocial Assessment, Interpersonal group therapy.  Evaluation of Outcomes: Not Met   Progress in Treatment: Attending groups: No. Participating in groups: No. Taking medication as prescribed: Yes. Toleration medication: Yes. Family/Significant other contact made: No, will contact:  pt refused consent Patient understands diagnosis: Yes. Discussing patient identified problems/goals with staff: No. Medical problems stabilized or resolved: Yes. Denies suicidal/homicidal ideation: Yes. Issues/concerns per patient self-inventory: No. Other: none  New problem(s) identified: No, Describe:  none  New Short Term/Long Term Goal(s): Pt reported that his goal is to "have a plan and have somewhere to live."   Discharge Plan or Barriers: Housing remains a barrier to discharge. Medication management has also presented a problem for pt's safety outside of the hospital. Pt continues to work towards his treatment plans.   Reason for Continuation of Hospitalization: Depression Medication stabilization  Estimated Length of Stay: 1-2 days  Recreational Therapy: Patient Stressors: Family, Relationship, Death, Friends, Work, School Patient Goal: Patient will identify 3 benefits of self-care x7 days.   Attendees: Patient: Mark [name] 09/18/2017  Physician: Dr. Bary Leriche, MD 09/18/2017  Nursing:  Tami Lin, RN 09/18/2017  RN Care Manager: 09/18/2017  Social Worker: Lurline Idol, LCSW 09/18/2017  Recreational Therapist:  09/18/2017  Other: Alden Hipp, LCSW 09/18/2017  Other: Darin Engels, [place] 09/18/2017  Other: 09/18/2017     Scribe for Treatment Team: Alden Hipp, LCSW 09/18/2017 11:33 AM

## 2017-09-18 NOTE — [facility] Group Notes (Signed)
09/18/2017 9:30am  Type of Therapy/Topic:  Group Therapy:  Emotion Regulation  Participation Level:  Active   Description of Group:   The purpose of this group is to assist patients in learning to regulate negative emotions and experience positive emotions. Patients will be guided to discuss ways in which they have been vulnerable to their negative emotions. These vulnerabilities will be juxtaposed with experiences of positive emotions or situations, and patients will be challenged to use positive emotions to combat negative ones. Special emphasis will be placed on coping with negative emotions in conflict situations, and patients will process healthy conflict resolution skills.  Therapeutic Goals: 1. Patient will identify two positive emotions or experiences to reflect on in order to balance out negative emotions 2. Patient will label two or more emotions that they find the most difficult to experience 3. Patient will demonstrate positive conflict resolution skills through discussion and/or role plays  Summary of Patient Progress: Stayed entire time, actively engaged. Mark NeedleMichael states that he is "feeling concerned about getting discharged to the streets".  He was able to identify negative emotions that he experiences and people such as his family who assist him in regulating those emotions when he is in need.  Mood was good.  Therapeutic Modalities:   Cognitive Behavioral Therapy Feelings Identification Dialectical Behavioral Therapy   Johny ShearsCassandra  Tamu Golz, LCSW 09/18/2017 11:24 AM

## 2017-09-18 NOTE — Progress Notes (Signed)
Patient's gait is unsteady and observed to be shuffling. This Clinical research associatewriter asked the patient if he needs the wheelchair and patient stated "no, my joints are just stiff from laying down". This writer continues to advise patient to notify staff if he feels dizzy and feels like he may fall.

## 2017-09-18 NOTE — Progress Notes (Signed)
Hypoglycemic Event  CBG: 39  Treatment: 15 grams   Symptoms: asymptomatic   Follow-up CBG: Time: 11:30 CBG Result: 58  Possible Reasons for Event: Patient received medication and then refused to eat breakfast.  Comments/MD notified: 11:15 am; This Clinical research associatewriter gave an additional 15 grams to patient and CBG was rechecked at 11:40 am and the result was 95.    Laurice Kimmons

## 2017-09-18 NOTE — Progress Notes (Signed)
Patient ID: Mark [name], male   DOB: 10/27/1985, 31 y.o.   MRN: [id number] Disheveled, poor body maintenance, poor hygiene, flat, sad, depressed affect and mood, hopeless, feel safe here on the unit, "[facility] that[facility]. [facility] at least for 3 days until I can pick up my disability check." CBG=238 @ bedtime, c/o 5/10 "tailbone" pain, received Tylenol 650 mg PO PRN; no neuro deficit, wc provided after bedtime medication, encouraged to call for help prior to getting OOB.

## 2017-09-18 NOTE — Progress Notes (Signed)
Patient experienced another fall this morning on the unit in the day room. Patient states that "I was getting coffee and turned around and tripped over her wheelchair" regarding another patient on the unit. Patient states that "I spilled coffee on my left foot" and rates his pain level as a "9/10". Patient's left foot is light pink/red in color. Patient's vitals were taken after fall and were stable. Patient's CBG was 158. Patient was advised to use a wheelchair to help prevent another fall.

## 2017-09-18 NOTE — Progress Notes (Signed)
Patient was seen ambulating on the unit down to the day room and this writer asked the patient why wasn't he in the wheelchair and patient states "It's better for me not to use it". This Clinical research associatewriter make it clear that the wheelchair is for a precaution and to help prevent another fall and patient stated "I'm not going to fall and if I do I'm not going to sue the hospital".

## 2017-09-18 NOTE — [facility] Group Notes (Signed)
[facility] Group Notes:  (Nursing/MHT/Case Management/Adjunct)  Date:  09/18/2017  Time:  9:31 PM  Type of Therapy:  Group Therapy  Participation Level:  Active  Participation Quality:  Appropriate  Affect:  Appropriate  Cognitive:  Alert  Insight:  Good  Engagement in Group:  Engaged  Modes of Intervention:  Support  Summary of Progress/Problems:  Mayra NeerJackie L [name] 09/18/2017, 9:31 PM

## 2017-09-18 NOTE — Progress Notes (Signed)
D- Patient alert and oriented. Patient presents in a sad/depressed mood. Patient states that his anxiety is at a "10/10" and his depression is at a "7/10 because of my situation". Patient endorses passive SI without a plan at this time, but does agree to come to staff if this changes. Patient denies HI, AVH, and pain at this time.  A- Scheduled medications administered to patient, per MD orders. Support and encouragement provided.  Routine safety checks conducted every 15 minutes.  Patient informed to notify staff with problems or concerns.  R- No adverse drug reactions noted. Patient contracts for safety at this time. Patient compliant with medications and treatment plan. Patient receptive, calm, and cooperative. Patient interacts well with others on the unit.  Patient remains safe at this time.

## 2017-09-18 NOTE — Progress Notes (Signed)
Recreation Therapy Notes  Date: 11.28.18  Time: 1:00pm  Location: Craft Room  Behavioral response: N/A  Intervention Topic: Stress  Discussion/Intervention: Patient did not attend group. Clinical Observations/Feedback:  Patient did not attend group.  Lucresha Dismuke LRT/CTRS         Domingue Coltrain 09/18/2017 2:36 PM

## 2017-09-18 NOTE — Progress Notes (Signed)
[facility] MD Progress Note  09/18/2017 1:50 PM MATH BRAZIE  MRN:  [id number]  Subjective:   Mr. Golladay torpedoed his discharge yesterday yelling in the hallway that he will kill himself if discharged. He met with treatment team today to discuss discharge options. He now reluctantly agrees to [facility] but it is unclear if they still accept him. Patient still threatens suicide and becomes agitated and rude easily. He will not survive one day at the shelter with this type of behavior.  Treatment plan. We will continue all his medications of Haldol, Effexor, Neurontin, Depakote, remeron and Trazodone. He is only allowed two psychotropic medications at the shelter. I will ask for second opinion.  Social/disposition. The patient is homeless with no resources or support.  Principal Problem: Severe recurrent major depression without psychotic features ([place]) Diagnosis:   Patient Active Problem List   Diagnosis Date Noted  . Diabetes ([place]) [E11.9] 09/11/2017  . Hypoglycemia [E16.2] 09/09/2017  . MDD (major depressive disorder) [F32.9] 08/22/2017  . Severe recurrent major depression without psychotic features ([place]) [F33.2] 08/19/2017  . Nausea & vomiting [R11.2] 08/18/2017  . Abdominal pain [R10.9] 08/18/2017  . Hyponatremia [E87.1] 08/18/2017  . DKA (diabetic ketoacidoses) ([place]) [E13.10] 11/24/2015  . Diabetic keto-acidosis ([place]) [E13.10] 10/23/2015   Total Time spent with patient: 30 minutes  Past Psychiatric History: bipolar disorder  Past Medical History:  Past Medical History:  Diagnosis Date  . Diabetes mellitus without complication ([place])   . Hydrocephalus   . Kidney stones     Past Surgical History:  Procedure Laterality Date  . KIDNEY STONE SURGERY     Family History:  Family History  Problem Relation Age of Onset  . Breast cancer Mother    Family Psychiatric  History: bipolar Social History:  Social History   Substance and Sexual Activity   Alcohol Use No   Comment: ocasional      Social History   Substance and Sexual Activity  Drug Use Yes  . Types: Marijuana   Comment: "as often as I can"    Social History   Socioeconomic History  . Marital status: Single    Spouse name: None  . Number of children: None  . Years of education: None  . Highest education level: None  Social Needs  . Financial resource strain: None  . Food insecurity - worry: None  . Food insecurity - inability: None  . Transportation needs - medical: None  . Transportation needs - non-medical: None  Occupational History  . None  Tobacco Use  . Smoking status: Current Every Day Smoker    Packs/day: 1.00    Types: Cigarettes  . Smokeless tobacco: Never Used  Substance and Sexual Activity  . Alcohol use: No    Comment: ocasional   . Drug use: Yes    Types: Marijuana    Comment: "as often as I can"  . Sexual activity: None  Other Topics Concern  . None  Social History Narrative  . None   Additional Social History:                         Sleep: Fair  Appetite:  Fair  Current Medications: Current Facility-Administered Medications  Medication Dose Route Frequency Provider Last Rate Last Dose  . acetaminophen (TYLENOL) tablet 650 mg  650 mg Oral Q6H PRN Clapacs, Madie Reno, MD   650 mg at 09/17/17 2127  . alum & mag hydroxide-simeth (MAALOX/MYLANTA) 200-200-20 MG/5ML  suspension 30 mL  30 mL Oral Q4H PRN Clapacs, [name] T, MD      . divalproex (DEPAKOTE) DR tablet 500 mg  500 mg Oral Q8H Paddy Neis B, MD   500 mg at 09/18/17 0859  . feeding supplement (GLUCERNA SHAKE) (GLUCERNA SHAKE) liquid 237 mL  237 mL Oral TID BM Ezrael Sam B, MD   237 mL at 09/17/17 2000  . gabapentin (NEURONTIN) capsule 1,200 mg  1,200 mg Oral TID Falon Flinchum B, MD   1,200 mg at 09/18/17 1253  . haloperidol (HALDOL) tablet 5 mg  5 mg Oral BID Sole Lengacher B, MD   5 mg at 09/18/17 0900  . hydrOXYzine (ATARAX/VISTARIL) tablet  25 mg  25 mg Oral Q4H PRN Ramond Dial, MD   25 mg at 09/16/17 1708  . Influenza vac split quadrivalent PF (FLUARIX) injection 0.5 mL  0.5 mL Intramuscular Tomorrow-1000 Rossie Bretado B, MD      . insulin aspart (novoLOG) injection 0-5 Units  0-5 Units Subcutaneous QHS Safina Huard B, MD   3 Units at 09/17/17 2130  . insulin aspart (novoLOG) injection 0-9 Units  0-9 Units Subcutaneous TID WC Evangelina Delancey B, MD   9 Units at 09/18/17 1258  . insulin aspart (novoLOG) injection 6 Units  6 Units Subcutaneous TID WC Ramond Dial, MD   6 Units at 09/18/17 1256  . insulin glargine (LANTUS) injection 36 Units  36 Units Subcutaneous QHS Alegria Dominique B, MD      . magnesium hydroxide (MILK OF MAGNESIA) suspension 30 mL  30 mL Oral Daily PRN Clapacs, [name] T, MD      . mirtazapine (REMERON) tablet 30 mg  30 mg Oral QHS Clapacs, [name] T, MD   30 mg at 09/17/17 2127  . nicotine (NICODERM CQ - dosed in mg/24 hours) patch 21 mg  21 mg Transdermal Daily Samarion Ehle B, MD   21 mg at 09/18/17 0900  . polyethylene glycol (MIRALAX / GLYCOLAX) packet 17 g  17 g Oral Daily PRN Clapacs, [name] T, MD      . traZODone (DESYREL) tablet 200 mg  200 mg Oral QHS Jezabel Lecker B, MD   200 mg at 09/17/17 2127  . venlafaxine XR (EFFEXOR-XR) 24 hr capsule 225 mg  225 mg Oral Q breakfast Ramond Dial, MD   225 mg at 09/18/17 0900    Lab Results:  Results for orders placed or performed during the hospital encounter of 09/10/17 (from the past 48 hour(s))  Glucose, capillary     Status: Abnormal   Collection Time: 09/16/17  4:20 PM  Result Value Ref Range   Glucose-Capillary 228 (H) 65 - 99 mg/dL   Comment 1 Notify RN   Glucose, capillary     Status: Abnormal   Collection Time: 09/16/17  8:49 PM  Result Value Ref Range   Glucose-Capillary 246 (H) 65 - 99 mg/dL   Comment 1 Notify RN    Comment 2 Document in Chart   Glucose, capillary     Status: Abnormal   Collection Time:  09/17/17  1:21 AM  Result Value Ref Range   Glucose-Capillary 333 (H) 65 - 99 mg/dL  Glucose, capillary     Status: Abnormal   Collection Time: 09/17/17  7:03 AM  Result Value Ref Range   Glucose-Capillary 218 (H) 65 - 99 mg/dL  Glucose, capillary     Status: Abnormal   Collection Time: 09/17/17 11:01 AM  Result Value Ref Range   Glucose-Capillary  39 (LL) 65 - 99 mg/dL   Comment 1 Repeat Test    Comment 2 CHARGE CREDITED     Comment: Performed at [facility], [place] [address] [place], [place] [address]  Glucose, capillary     Status: Abnormal   Collection Time: 09/17/17 11:07 AM  Result Value Ref Range   Glucose-Capillary 34 (LL) 65 - 99 mg/dL   Comment 1 Document in Chart    Comment 2 Repeat Test   Glucose, capillary     Status: Abnormal   Collection Time: 09/17/17 11:30 AM  Result Value Ref Range   Glucose-Capillary 58 (L) 65 - 99 mg/dL  Glucose, capillary     Status: None   Collection Time: 09/17/17 11:43 AM  Result Value Ref Range   Glucose-Capillary 95 65 - 99 mg/dL  Glucose, capillary     Status: Abnormal   Collection Time: 09/17/17  3:39 PM  Result Value Ref Range   Glucose-Capillary 346 (H) 65 - 99 mg/dL  Glucose, capillary     Status: Abnormal   Collection Time: 09/17/17  5:59 PM  Result Value Ref Range   Glucose-Capillary 482 (H) 65 - 99 mg/dL  Glucose, capillary     Status: Abnormal   Collection Time: 09/17/17  8:15 PM  Result Value Ref Range   Glucose-Capillary 238 (H) 65 - 99 mg/dL   Comment 1 Notify RN   Glucose, capillary     Status: Abnormal   Collection Time: 09/18/17  7:18 AM  Result Value Ref Range   Glucose-Capillary 138 (H) 65 - 99 mg/dL   Comment 1 Notify RN   Glucose, capillary     Status: Abnormal   Collection Time: 09/18/17 10:31 AM  Result Value Ref Range   Glucose-Capillary 158 (H) 65 - 99 mg/dL  Glucose, capillary     Status: Abnormal   Collection Time: 09/18/17 12:52 PM  Result Value Ref Range   Glucose-Capillary 369 (H) 65 - 99  mg/dL    Blood Alcohol level:  Lab Results  Component Value Date   ETH <10 09/08/2017   ETH <10 32/54/9826    Metabolic Disorder Labs: Lab Results  Component Value Date   HGBA1C 8.7 (H) 09/11/2017   MPG 202.99 09/11/2017   No results found for: PROLACTIN Lab Results  Component Value Date   CHOL 217 (H) 09/11/2017   TRIG 135 09/11/2017   HDL 49 09/11/2017   CHOLHDL 4.4 09/11/2017   VLDL 27 09/11/2017   LDLCALC 141 (H) 09/11/2017   LDLCALC 216 (H) 08/23/2017    Physical Findings: AIMS: Facial and Oral Movements Muscles of Facial Expression: None, normal Lips and Perioral Area: None, normal Jaw: None, normal Tongue: None, normal,Extremity Movements Upper (arms, wrists, hands, fingers): None, normal Lower (legs, knees, ankles, toes): None, normal, Trunk Movements Neck, shoulders, hips: None, normal, Overall Severity Severity of abnormal movements (highest score from questions above): None, normal Incapacitation due to abnormal movements: None, normal Patient's awareness of abnormal movements (rate only patient's report): No Awareness, Dental Status Current problems with teeth and/or dentures?: No Does patient usually wear dentures?: No  CIWA:    COWS:     Musculoskeletal: Strength & Muscle Tone: decreased Gait & Station: normal, unsteady Patient leans: N/A  Psychiatric Specialty Exam: Physical Exam  Nursing note and vitals reviewed. Psychiatric: His affect is angry, labile and inappropriate. His speech is rapid and/or pressured. He is agitated. Cognition and memory are normal. He expresses impulsivity. He expresses suicidal ideation.  Review of Systems  Musculoskeletal: Positive for falls.  Neurological: Negative.   Psychiatric/Behavioral: Positive for depression and suicidal ideas.  All other systems reviewed and are negative.   Blood pressure 125/75, pulse 78, temperature 98 F (36.7 C), temperature source Oral, resp. rate 17, height _0  (1.88 m),  weight 97.1 kg (214 lb), SpO2 100 %.Body mass index is 27.48 kg/m.  General Appearance: Fairly Groomed  Eye Contact:  Good  Speech:  Pressured  Volume:  Increased  Mood:  Angry and Irritable  Affect:  Congruent  Thought Process:  Goal Directed and Descriptions of Associations: Intact  Orientation:  Full (Time, Place, and Person)  Thought Content:  WDL  Suicidal Thoughts:  Yes.  with intent/plan  Homicidal Thoughts:  No  Memory:  Immediate;   Fair Recent;   Fair Remote;   Fair  Judgement:  Poor  Insight:  Lacking  Psychomotor Activity:  Restlessness  Concentration:  Concentration: Fair and Attention Span: Fair  Recall:  [employer] of Knowledge:  Fair  Language:  Fair  Akathisia:  No  Handed:  Right  AIMS (if indicated):     Assets:  Communication Skills Desire for Improvement Resilience  ADL's:  Intact  Cognition:  WNL  Sleep:  Number of Hours: 7     Treatment Plan Summary: Daily contact with patient to assess and evaluate symptoms and progress in treatment and Medication management   Mr. Heatwole is a 31 year old male with a history ofMDD, severe with suicidal ideation, type 1 diabetes and treatment noncompliance transferred from ICU where he was hospitalized for severe hypoglycemia from presumed insulin overdose.  #Suicidal ideation -the patient threatens to kill himself if discharged -able to contract for safety in the hospital  #Mood -continue Haldol 5 mg BID -continue Depakote 500 mg TID, VPA level 93, will check ammonia level -ContinueEffexor dose at225 mg  -Continue Remeron 30 mg nightly -if discharged to the homeless shelter he may only take 2 psychotropic medications  #Diabetes, poorly controlled -Lantus decresed to 36 units, ADA diet, SSI, blood glucose monitoring, BG low this morning -Appreciate recommendations from diabetes coordinator. -NovoLog insulin6units thrice daily as scheduled, hold if patient does not eat a meal or eats less than 50% of  his meal  #Insomnia -Trazodone 100 mg nightly  #Social -patient has no income, health insurance, housing or support  #Falls -PT consult  #R/O malingering -will meet with treatment team to formulate a plan  #Disposition -TBE    Orson Slick, MD 09/18/2017, 1:50 PM

## 2017-09-19 ENCOUNTER — Inpatient Hospital Stay: Payer: No Typology Code available for payment source

## 2017-09-19 DIAGNOSIS — G911 Obstructive hydrocephalus: Secondary | ICD-10-CM | POA: Diagnosis present

## 2017-09-19 LAB — GLUCOSE, CAPILLARY
Glucose-Capillary: 305 mg/dL — ABNORMAL HIGH (ref 65–99)
Glucose-Capillary: 161 mg/dL — ABNORMAL HIGH (ref 65–99)
Glucose-Capillary: 238 mg/dL — ABNORMAL HIGH (ref 65–99)
Glucose-Capillary: 323 mg/dL — ABNORMAL HIGH (ref 65–99)

## 2017-09-19 MED ORDER — INSULIN GLARGINE 100 UNIT/ML ~~LOC~~ SOLN
38.0000 [IU] | Freq: Every day | SUBCUTANEOUS | Status: DC
  Administered 2017-09-19 – 2017-09-25 (×7): 38 [IU] via SUBCUTANEOUS
  Filled 2017-09-19 (×8): qty 0.38

## 2017-09-19 NOTE — Plan of Care (Signed)
Patient states he has passive si, no plan currently. He is weak and reinforced to use wheelchair. No hi, avh. He is cooperative with physical therapy staff. He is med compliant.

## 2017-09-19 NOTE — Plan of Care (Signed)
Patient slept for Estimated Hours of 6.15; Precautionary checks every 15 minutes for safety maintained, room free of safety hazards, patient sustains no injury or falls during this shift.  

## 2017-09-19 NOTE — Progress Notes (Signed)
Recreation Therapy Notes          Shamarion Coots 09/19/2017 2:12 PM

## 2017-09-19 NOTE — Progress Notes (Signed)
Recreation Therapy Notes         Mark [name] 09/19/2017 3:57 PM 

## 2017-09-19 NOTE — Progress Notes (Signed)
D:Pt denies Homicidal ideation and denies AVH, but indorses passive SI. Pt. States, "I want to just kill myself when I leave here, but when I'm here I don't", "I don't want to hurt myself while I'm here". Pt. Contracts verbally for safety.  Pt. Upon interaction is cooperative and appropriate with speaking to this Clinical research associatewriter.  Pt. Complains of low levels of anxiety and depression, "about a 5 for both".   A: Q x 15 minute observation checks were completed for safety. Patient was provided with education. Patient was given scheduled medications. Patient  was encourage to attend groups, participate in unit activities and continue with plan of care. Pt. Frequently seen in the milieu socializing with peers and participating in unit activities. Pt. Reports feeling, "better" today. Pt. Nutrition reports show pt. Is eating adequate. Pt. This evening refused his shake and continues to refuse this evening the recommendations to utilize assistive device given by PT. Pt. Education on remaining free from falls while on the unit given.    R:Patient is complaint with medications and unit procedures.            Patient slept for Estimated Hours of 6.30; Precautionary checks every 15 minutes for safety maintained, room free of safety hazards, patient sustains no injury or falls during this shift.

## 2017-09-19 NOTE — Progress Notes (Signed)
Patient ID: Mark [name], male   DOB: 03/02/1986, 31 y.o.   MRN: [id number] Neurological intact post fall re-assessment, continues to be noncompliant with assistive device, observed (high risk behaviors/potential for injury) pushing and runing with the wheel chair, his pant coming off, irritable and closed the doors on us when re-directed; he will go to [facility] on discharge; mixed mood and affect; pleasant and irritable, refused to follow directions. CBG=197 at bedtime. Patient wet the bed overnight, assisted with making his bed. He will not attempt suicide while is here but can not promise to keep himself from self harm after discharge.

## 2017-09-19 NOTE — Consult Note (Signed)
Referring Physician:  Audery Amellapacs, [name] T, MD [address] [place], [place] [address]  Primary Physician:  Patient, No Pcp Per  Chief Complaint:  Truncal ataxia, hydrocephalus  History of Present Illness: 09/19/2017 Mark [name] is a 31 y.o. male who [facility] for suicidal ideation secondary to major depression.  He has been admitted to the behavioral health ward.  He has had a functional decline in his mobility, so imaging was performed that showed hydrocephalus, prompting neurosurgical consultation.  On my examination, Mark [name] says he has been having headaches for several years.  He reports some blurry vision that is longstanding.  He denies current nausea or vomiting.  His headaches are low level, and do not wake him at night.  It does not seem that they have changed recently.  Over an unclear period of time, he has had functional decline that has now limited his balance.    Of note, he had a recent suicide attempt, and has very poor social support.  He is currently homeless.   Review of Systems:  A 10 point review of systems is negative, except for the pertinent positives and negatives detailed in the HPI.  Past Medical History: Past Medical History:  Diagnosis Date  . Diabetes mellitus without complication ([facility])   . Hydrocephalus   . Kidney stones     Past Surgical History: Past Surgical History:  Procedure Laterality Date  . KIDNEY STONE SURGERY      Allergies: Allergies as of 09/10/2017 - Review Complete 09/10/2017  Allergen Reaction Noted  . Penicillins Anaphylaxis, Hives, and Swelling 10/23/2015  . Sulfa antibiotics Anaphylaxis and Hives 10/23/2015  . Clindamycin/lincomycin Hives 10/23/2015    Medications:  Current Facility-Administered Medications:  .  acetaminophen (TYLENOL) tablet 650 mg, 650 mg, Oral, Q6H PRN, Clapacs, Jackquline DenmarkJohn T, MD, 650 mg at 09/17/17 2127 .  alum & mag hydroxide-simeth (MAALOX/MYLANTA)  200-200-20 MG/5ML suspension 30 mL, 30 mL, Oral, Q4H PRN, Clapacs, [name] T, MD .  divalproex (DEPAKOTE) DR tablet 500 mg, 500 mg, Oral, Q8H, Pucilowska, Jolanta B, MD, 500 mg at 09/19/17 0903 .  feeding supplement (GLUCERNA SHAKE) (GLUCERNA SHAKE) liquid 237 mL, 237 mL, Oral, TID BM, Pucilowska, Jolanta B, MD, 237 mL at 09/19/17 1038 .  gabapentin (NEURONTIN) capsule 1,200 mg, 1,200 mg, Oral, TID, Pucilowska, Jolanta B, MD, 1,200 mg at 09/19/17 1223 .  haloperidol (HALDOL) tablet 5 mg, 5 mg, Oral, BID, Pucilowska, Jolanta B, MD, 5 mg at 09/19/17 0902 .  hydrOXYzine (ATARAX/VISTARIL) tablet 25 mg, 25 mg, Oral, Q4H PRN, Aundria Rudakesh, Gopalkumar, MD, 25 mg at 09/16/17 1708 .  Influenza vac split quadrivalent PF (FLUARIX) injection 0.5 mL, 0.5 mL, Intramuscular, Tomorrow-1000, Pucilowska, Jolanta B, MD .  insulin aspart (novoLOG) injection 0-5 Units, 0-5 Units, Subcutaneous, QHS, Pucilowska, Jolanta B, MD, 3 Units at 09/17/17 2130 .  insulin aspart (novoLOG) injection 0-9 Units, 0-9 Units, Subcutaneous, TID WC, Pucilowska, Jolanta B, MD, 2 Units at 09/19/17 1223 .  insulin aspart (novoLOG) injection 6 Units, 6 Units, Subcutaneous, TID WC, Aundria Rudakesh, Gopalkumar, MD, 6 Units at 09/19/17 1224 .  insulin glargine (LANTUS) injection 38 Units, 38 Units, Subcutaneous, QHS, Pucilowska, Jolanta B, MD .  magnesium hydroxide (MILK OF MAGNESIA) suspension 30 mL, 30 mL, Oral, Daily PRN, Clapacs, [name] T, MD .  mirtazapine (REMERON) tablet 30 mg, 30 mg, Oral, QHS, Clapacs, [name] T, MD, 30 mg at 09/18/17 2145 .  nicotine (NICODERM CQ - dosed in mg/24 hours) patch  21 mg, 21 mg, Transdermal, Daily, Pucilowska, Jolanta B, MD, 21 mg at 09/19/17 0905 .  polyethylene glycol (MIRALAX / GLYCOLAX) packet 17 g, 17 g, Oral, Daily PRN, Clapacs, [name] T, MD .  traZODone (DESYREL) tablet 200 mg, 200 mg, Oral, QHS, Pucilowska, Jolanta B, MD, 200 mg at 09/18/17 2145 .  venlafaxine XR (EFFEXOR-XR) 24 hr capsule 225 mg, 225 mg, Oral, Q breakfast,  Aundria Rudakesh, Gopalkumar, MD, 225 mg at 09/19/17 [id number]   Social History: Social History   Tobacco Use  . Smoking status: Current Every Day Smoker    Packs/day: 1.00    Types: Cigarettes  . Smokeless tobacco: Never Used  Substance Use Topics  . Alcohol use: No    Comment: ocasional   . Drug use: Yes    Types: Marijuana    Comment: "as often as I can"    Family Medical History: Family History  Problem Relation Age of Onset  . Breast cancer Mother     Physical Examination: Vitals:   09/18/17 2030 09/19/17 0700  BP: 120/68 113/65  Pulse: 86 73  Resp: 18 20  Temp: 97.6 F (36.4 C) 97.8 F (36.6 C)  SpO2: 97% 98%     General: Patient is well developed, well nourished, calm, collected, and in no apparent distress.  Psychiatric: Patient is non-anxious currently.  Head:  Pupils equal, round, and reactive to light.  ENT:  Oral mucosa appears well hydrated. Dentition is poor  Neck:   Supple.  Full range of motion.  Respiratory: Patient is breathing without any difficulty.  Extremities: No edema.  Vascular: Palpable pulses in dorsal pedal vessels.  Skin:   On exposed skin, there are no abnormal skin lesions.  NEUROLOGICAL:  General: In no acute distress.   Awake, alert, oriented to person, place, and time.  Pupils equal round and reactive to light.  Facial tone is symmetric.  Tongue protrusion is midline.  There is no pronator drift.  ROM of spine: full.  Palpation of spine: nontender.    Strength: Side Biceps Triceps Deltoid Interossei Grip Wrist Ext. Wrist Flex.  R 5 5 5 5 5 5 5   L 5 5 5 5 5 5 5    Side Iliopsoas Quads Hamstring PF DF EHL  R 5 5 5 5 5 5   L 5 5 5 5 5 5    Reflexes are 2+ and symmetric at the biceps, triceps, brachioradialis, patella and achilles.   Bilateral upper and lower extremity sensation is intact to light touch and pin prick.  Clonus is not present.  Toes are down-going.  Gait is abnormal and wide-based.  Cannot perform tandem gait.   Hoffman's is absent.  RAM are mildly slowed.    Imaging: CT Head 09/19/17 IMPRESSION: Severe obstructive hydrocephalus unchanged from 2013. Findings consistent with aqueductal stenosis. Neurosurgical consultation recommended.   Electronically Signed   By: Marlan Palauharles  [name] M.D.   On: 09/19/2017 10:10  I have personally reviewed the images and agree with the above interpretation.  Assessment and Plan: Mark [name] is a pleasant 31 y.o. 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  This is chronic, and has been present for at least 5 years.  I suspect he has had this since birth.    At this point, I do not think intervention is likely to improve his symptoms.  He has a multitude of social challenges and mental health challenges that are severely impacting his life.  I would not recommend intervention on his hydrocephalus currently.  When he is discharged, I am happy to see him at the [facility] in follow-up.  [phone] for appointment    Annia Friendly K. Myer Haff MD, MPHS Dept. of Neurosurgery

## 2017-09-19 NOTE — Progress Notes (Signed)
CSW spoke to Courtney HeysWilliam Green, [facility], [phone], and gave him update on pt discharge being delayed.  He has filled the bed that was available.  CSW will call back on Monday to check bed availability. Garner NashGregory Chaquetta Schlottman, MSW, LCSW Clinical Social Worker 09/19/2017 12:27 PM

## 2017-09-19 NOTE — [facility] Group Notes (Signed)
09/19/2017  Time: 0930  Type of Therapy/Topic:  Group Therapy:  Balance in Life  Participation Level:  Did Not Attend  Description of Group:   This group will address the concept of balance and how it feels and looks when one is unbalanced. Patients will be encouraged to process areas in their lives that are out of balance and identify reasons for remaining unbalanced. Facilitators will guide patients in utilizing problem-solving interventions to address and correct the stressor making their life unbalanced. Understanding and applying boundaries will be explored and addressed for obtaining and maintaining a balanced life. Patients will be encouraged to explore ways to assertively make their unbalanced needs known to significant others in their lives, using other group members and facilitator for support and feedback.  Therapeutic Goals: 1. Patient will identify two or more emotions or situations they have that consume much of in their lives. 2. Patient will identify signs/triggers that life has become out of balance:  3. Patient will identify two ways to set boundaries in order to achieve balance in their lives:  4. Patient will demonstrate ability to communicate their needs through discussion and/or role plays  Summary of Patient Progress: Pt was invited to attend group but chose not to attend. CSW will continue to encourage pt to attend groups throughout his admission.    Therapeutic Modalities:   Cognitive Behavioral Therapy Solution-Focused Therapy Assertiveness Training   Heidi DachKelsey Bekki Tavenner, MSW, LCSW 09/19/2017 10:26 AM

## 2017-09-19 NOTE — Evaluation (Signed)
Physical Therapy Evaluation Patient Details Name: Mark [name] MRN: [id number] DOB: 11/16/1985 Today's Date: 09/19/2017   History of Present Illness  [name] is a 31 year old male with a history ofMDD, severe with suicidal ideation, type 1 diabetes and treatment noncompliance transferred from ICU where he was hospitalized for severe hypoglycemia from presumed insulin overdose.CT head revealed severe obstructive hydrocephalus unchanged since 2013.  Neuro consult pending.     Clinical Impression  Pt admitted with above diagnosis. Pt currently with functional limitations due to the deficits listed below (see PT Problem List). BLE strength grossly 4/5 with MMT and gross motor coordination testing WNL.  However, pt presents with ataxic gait with forward trunk lean while ambulating, placing him at an increased fall risk, requiring min assist several times while ambulating 50 ft to prevent LOB and fall. Given pt's current mobility status, recommending SNF at d/c; however, pt without insurance and current plan is to d/c to shelter.  Voicemail left with CSW regarding rehab recommendations and hopeful that CSW will be able to provide any resources available at d/c for therapy. [facility] may be an option but the patient would need assist with transportation to the clinic.  Appreciate CSW input.  Pt will benefit from skilled PT to increase their independence and safety with mobility to allow discharge to the venue listed below.      Follow Up Recommendations SNF;Supervision for mobility/OOB    Equipment Recommendations  Rolling walker with 5" wheels    Recommendations for Other Services       Precautions / Restrictions Precautions Precautions: Fall Restrictions Weight Bearing Restrictions: No      Mobility  Bed Mobility Overal bed mobility: Independent             General bed mobility comments: No physical assist or cues needed.  Pt performs independently.    Transfers Overall transfer level: Needs assistance Equipment used: None Transfers: Sit to/from Stand Sit to Stand: Min guard         General transfer comment: Pt stood from bed slowly and with mild instability, requiring close min guard assist.  To sit he demonstrates poor eccentric control.   Ambulation/Gait Ambulation/Gait assistance: Min assist Ambulation Distance (Feet): 50 Feet Assistive device: Rolling walker (2 wheeled);None Gait Pattern/deviations: Ataxic;Shuffle;Decreased stride length;Antalgic;Trunk flexed;Staggering left;Staggering right Gait velocity: decreased Gait velocity interpretation: Below normal speed for age/gender General Gait Details: Pt ambulates initially without RW demonstrating anterior trunk lean and shuffling steps with ataxic gait, requiring min assist at times due to LOB anterior, L, and R.  Mild improvement with introduction of RW as pt pushes it too far ahead despite verbal cues and assist for management of RW.  Pt reports BLE fatigue after ambulating 30 ft.   Stairs            Wheelchair Mobility    Modified Rankin (Stroke Patients Only)       Balance Overall balance assessment: Needs assistance Sitting-balance support: No upper extremity supported;Feet supported Sitting balance-Leahy Scale: Fair Sitting balance - Comments: After supine>sit at start of session the pt demonstrates good postural control without signs of instability; however, after ambulating and return to bed the pt demonstrates a posterior lean and LOB posteriorly x2 with ability to correct but with much effort.                                      Pertinent Vitals/Pain  Pain Assessment: No/denies pain    Home Living Family/patient expects to be discharged to:: Shelter/Homeless                 Additional Comments: Pt was recently living at a boarding house but can no longer pay rent.  Plan is to d/c to a [facility].     Prior  Function Level of Independence: Independent         Comments: Pt able to ambulate without AD.  No falls before just PTA.       Hand Dominance        Extremity/Trunk Assessment   Upper Extremity Assessment Upper Extremity Assessment: Overall WFL for tasks assessed    Lower Extremity Assessment Lower Extremity Assessment: (BLE strength grossly 4/5)       Communication   Communication: No difficulties  Cognition Arousal/Alertness: Awake/alert Behavior During Therapy: Flat affect Overall Cognitive Status: Within Functional Limits for tasks assessed                                        General Comments General comments (skin integrity, edema, etc.): BLE strength grossly 4/5 with MMT and gross motor coordination testing WNL.  However, pt presents with ataxic gait with forward trunk lean while ambulating, placing his at an increased fall risk, requiring min assist several times while ambulating 50 ft.     Exercises     Assessment/Plan    PT Assessment Patient needs continued PT services  PT Problem List Decreased strength;Decreased activity tolerance;Decreased balance;Decreased knowledge of use of DME;Decreased safety awareness       PT Treatment Interventions DME instruction;Stair training;Gait training;Functional mobility training;Therapeutic activities;Balance training;Therapeutic exercise;Neuromuscular re-education;Patient/family education;Wheelchair mobility training    PT Goals (Current goals can be found in the Care Plan section)  Acute Rehab PT Goals Patient Stated Goal: none stated PT Goal Formulation: With patient Time For Goal Achievement: 10/03/17 Potential to Achieve Goals: Good    Frequency Min 2X/week   Barriers to discharge Other (comment) Current plan is to d/c to shelter where he will not have assist    Co-evaluation               AM-PAC PT "6 Clicks" Daily Activity  Outcome Measure Difficulty turning over in bed  (including adjusting bedclothes, sheets and blankets)?: None Difficulty moving from lying on back to sitting on the side of the bed? : A Little Difficulty sitting down on and standing up from a chair with arms (e.g., wheelchair, bedside commode, etc,.)?: A Little Help needed moving to and from a bed to chair (including a wheelchair)?: A Little Help needed walking in hospital room?: A Lot Help needed climbing 3-5 steps with a railing? : A Lot 6 Click Score: 17    End of Session Equipment Utilized During Treatment: Gait belt Activity Tolerance: Patient limited by fatigue Patient left: in bed;with nursing/sitter in room Nurse Communication: Mobility status PT Visit Diagnosis: Unsteadiness on feet (R26.81);Repeated falls (R29.6);Other abnormalities of gait and mobility (R26.89);History of falling (Z91.81);Difficulty in walking, not elsewhere classified (R26.2)    Time: [id number] PT Time Calculation (min) (ACUTE ONLY): 13 min   Charges:   PT Evaluation $PT Eval Moderate Complexity: 1 Mod     PT G Codes:   PT G-Codes **NOT FOR INPATIENT CLASS** Functional Assessment Tool Used: AM-PAC 6 Clicks Basic Mobility;Clinical judgement Functional Limitation: Mobility: Walking and moving around  Mobility: Walking and Moving Around Current Status [phone]): At least 40 percent but less than 60 percent impaired, limited or restricted Mobility: Walking and Moving Around Goal Status [phone]): At least 1 percent but less than 20 percent impaired, limited or restricted    Encarnacion Chu PT, DPT 09/19/2017, 11:28 AM

## 2017-09-19 NOTE — Progress Notes (Signed)
[facility] Progress Note  09/19/2017 10:59 AM Mark [name]  MRN:  [id number]  Subjective:   [name] still complains of depression today but there are no behavioral problems. He is cool, collected and apologetic about his behavior yesterday. He worked well with PT during assessment today. The patient has been falling during this admission and is using a wheel chair now. Head CT scan revealed old obstructive hydrocephalus. Spoke with Dr. Myer HaffYarbrough, neurosurgery, who will see the patient.   Treatment plan. We will continue Depakote, Haldol, Effexor, Remeron, Trazodone and Neurontin for depression and mood stabilization. Dose of Lantus was adjusted to 38 units at night.  Social/disposition. The patient is homeless with no resources or support. On Monday, 12/3, he will meet with financial services representative to apply for SSDI and Medicaid.   Principal Problem: Severe recurrent major depression without psychotic features (HCC) Diagnosis:   Patient Active Problem List   Diagnosis Date Noted  . Obstructive hydrocephalus [G91.1] 09/19/2017  . Diabetes (HCC) [E11.9] 09/11/2017  . Hypoglycemia [E16.2] 09/09/2017  . MDD (major depressive disorder) [F32.9] 08/22/2017  . Severe recurrent major depression without psychotic features (HCC) [F33.2] 08/19/2017  . Nausea & vomiting [R11.2] 08/18/2017  . Abdominal pain [R10.9] 08/18/2017  . Hyponatremia [E87.1] 08/18/2017  . DKA (diabetic ketoacidoses) (HCC) [E13.10] 11/24/2015  . Diabetic keto-acidosis (HCC) [E13.10] 10/23/2015   Total Time spent with patient: 30 minutes  Past Psychiatric History: bipolar disorder.  Past Medical History:  Past Medical History:  Diagnosis Date  . Diabetes mellitus without complication (HCC)   . Hydrocephalus   . Kidney stones     Past Surgical History:  Procedure Laterality Date  . KIDNEY STONE SURGERY     Family History:  Family History  Problem Relation Age of Onset  . Breast cancer Mother     Family Psychiatric  History: bipolar disorder. Social History:  Social History   Substance and Sexual Activity  Alcohol Use No   Comment: ocasional      Social History   Substance and Sexual Activity  Drug Use Yes  . Types: Marijuana   Comment: "as often as I can"    Social History   Socioeconomic History  . Marital status: Single    Spouse name: None  . Number of children: None  . Years of education: None  . Highest education level: None  Social Needs  . Financial resource strain: None  . Food insecurity - worry: None  . Food insecurity - inability: None  . Transportation needs - medical: None  . Transportation needs - non-medical: None  Occupational History  . None  Tobacco Use  . Smoking status: Current Every Day Smoker    Packs/day: 1.00    Types: Cigarettes  . Smokeless tobacco: Never Used  Substance and Sexual Activity  . Alcohol use: No    Comment: ocasional   . Drug use: Yes    Types: Marijuana    Comment: "as often as I can"  . Sexual activity: None  Other Topics Concern  . None  Social History Narrative  . None   Additional Social History:                         Sleep: Fair  Appetite:  Poor  Current Medications: Current Facility-Administered Medications  Medication Dose Route Frequency Provider Last Rate Last Dose  . acetaminophen (TYLENOL) tablet 650 mg  650 mg Oral Q6H PRN Clapacs, Jackquline DenmarkJohn T, MD  650 mg at 09/17/17 2127  . alum & mag hydroxide-simeth (MAALOX/MYLANTA) 200-200-20 MG/5ML suspension 30 mL  30 mL Oral Q4H PRN Clapacs, [name] T, MD      . divalproex (DEPAKOTE) DR tablet 500 mg  500 mg Oral Q8H Josefina Rynders B, MD   500 mg at 09/19/17 0903  . feeding supplement (GLUCERNA SHAKE) (GLUCERNA SHAKE) liquid 237 mL  237 mL Oral TID BM Najae Filsaime B, MD   237 mL at 09/19/17 1038  . gabapentin (NEURONTIN) capsule 1,200 mg  1,200 mg Oral TID Leniya Breit B, MD   1,200 mg at 09/19/17 0902  . haloperidol (HALDOL)  tablet 5 mg  5 mg Oral BID Mats Jeanlouis B, MD   5 mg at 09/19/17 0902  . hydrOXYzine (ATARAX/VISTARIL) tablet 25 mg  25 mg Oral Q4H PRN Aundria Rudakesh, Gopalkumar, MD   25 mg at 09/16/17 1708  . Influenza vac split quadrivalent PF (FLUARIX) injection 0.5 mL  0.5 mL Intramuscular Tomorrow-1000 Samiya Mervin B, MD      . insulin aspart (novoLOG) injection 0-5 Units  0-5 Units Subcutaneous QHS Justyne Roell B, MD   3 Units at 09/17/17 2130  . insulin aspart (novoLOG) injection 0-9 Units  0-9 Units Subcutaneous TID WC Tremel Setters B, MD   7 Units at 09/19/17 0904  . insulin aspart (novoLOG) injection 6 Units  6 Units Subcutaneous TID WC Aundria Rudakesh, Gopalkumar, MD   6 Units at 09/18/17 1705  . insulin glargine (LANTUS) injection 38 Units  38 Units Subcutaneous QHS Derwood Becraft B, MD      . magnesium hydroxide (MILK OF MAGNESIA) suspension 30 mL  30 mL Oral Daily PRN Clapacs, [name] T, MD      . mirtazapine (REMERON) tablet 30 mg  30 mg Oral QHS Clapacs, Jackquline DenmarkJohn T, MD   30 mg at 09/18/17 2145  . nicotine (NICODERM CQ - dosed in mg/24 hours) patch 21 mg  21 mg Transdermal Daily Grafton Warzecha B, MD   21 mg at 09/19/17 0905  . polyethylene glycol (MIRALAX / GLYCOLAX) packet 17 g  17 g Oral Daily PRN Clapacs, [name] T, MD      . traZODone (DESYREL) tablet 200 mg  200 mg Oral QHS Eleshia Wooley B, MD   200 mg at 09/18/17 2145  . venlafaxine XR (EFFEXOR-XR) 24 hr capsule 225 mg  225 mg Oral Q breakfast Aundria Rudakesh, Gopalkumar, MD   225 mg at 09/19/17 [id number]    Lab Results:  Results for orders placed or performed during the hospital encounter of 09/10/17 (from the past 48 hour(s))  Glucose, capillary     Status: Abnormal   Collection Time: 09/17/17 11:01 AM  Result Value Ref Range   Glucose-Capillary 39 (LL) 65 - 99 mg/dL   Comment 1 Repeat Test    Comment 2 CHARGE CREDITED     Comment: Performed at [facility], [address] [place], [place] [address]  Glucose, capillary      Status: Abnormal   Collection Time: 09/17/17 11:07 AM  Result Value Ref Range   Glucose-Capillary 34 (LL) 65 - 99 mg/dL   Comment 1 Document in Chart    Comment 2 Repeat Test   Glucose, capillary     Status: Abnormal   Collection Time: 09/17/17 11:30 AM  Result Value Ref Range   Glucose-Capillary 58 (L) 65 - 99 mg/dL  Glucose, capillary     Status: None   Collection Time: 09/17/17 11:43 AM  Result Value Ref  Range   Glucose-Capillary 95 65 - 99 mg/dL  Glucose, capillary     Status: Abnormal   Collection Time: 09/17/17  3:39 PM  Result Value Ref Range   Glucose-Capillary 346 (H) 65 - 99 mg/dL  Glucose, capillary     Status: Abnormal   Collection Time: 09/17/17  5:59 PM  Result Value Ref Range   Glucose-Capillary 482 (H) 65 - 99 mg/dL  Glucose, capillary     Status: Abnormal   Collection Time: 09/17/17  8:15 PM  Result Value Ref Range   Glucose-Capillary 238 (H) 65 - 99 mg/dL   Comment 1 Notify RN   Glucose, capillary     Status: Abnormal   Collection Time: 09/18/17  7:18 AM  Result Value Ref Range   Glucose-Capillary 138 (H) 65 - 99 mg/dL   Comment 1 Notify RN   Glucose, capillary     Status: Abnormal   Collection Time: 09/18/17 10:31 AM  Result Value Ref Range   Glucose-Capillary 158 (H) 65 - 99 mg/dL  Glucose, capillary     Status: Abnormal   Collection Time: 09/18/17 12:52 PM  Result Value Ref Range   Glucose-Capillary 369 (H) 65 - 99 mg/dL  Glucose, capillary     Status: Abnormal   Collection Time: 09/18/17  4:27 PM  Result Value Ref Range   Glucose-Capillary 174 (H) 65 - 99 mg/dL   Comment 1 Notify RN   Glucose, capillary     Status: Abnormal   Collection Time: 09/18/17  8:36 PM  Result Value Ref Range   Glucose-Capillary 197 (H) 65 - 99 mg/dL  Glucose, capillary     Status: Abnormal   Collection Time: 09/19/17  7:14 AM  Result Value Ref Range   Glucose-Capillary 305 (H) 65 - 99 mg/dL   Comment 1 Notify RN     Blood Alcohol level:  Lab Results  Component  Value Date   ETH <10 09/08/2017   ETH <10 08/18/2017    Metabolic Disorder Labs: Lab Results  Component Value Date   HGBA1C 8.7 (H) 09/11/2017   MPG 202.99 09/11/2017   No results found for: PROLACTIN Lab Results  Component Value Date   CHOL 217 (H) 09/11/2017   TRIG 135 09/11/2017   HDL 49 09/11/2017   CHOLHDL 4.4 09/11/2017   VLDL 27 09/11/2017   LDLCALC 141 (H) 09/11/2017   LDLCALC 216 (H) 08/23/2017    Physical Findings: AIMS: Facial and Oral Movements Muscles of Facial Expression: None, normal Lips and Perioral Area: None, normal Jaw: None, normal Tongue: None, normal,Extremity Movements Upper (arms, wrists, hands, fingers): None, normal Lower (legs, knees, ankles, toes): None, normal, Trunk Movements Neck, shoulders, hips: None, normal, Overall Severity Severity of abnormal movements (highest score from questions above): None, normal Incapacitation due to abnormal movements: None, normal Patient's awareness of abnormal movements (rate only patient's report): No Awareness, Dental Status Current problems with teeth and/or dentures?: No Does patient usually wear dentures?: No  CIWA:    COWS:     Musculoskeletal: Strength & Muscle Tone: decreased Gait & Station: unsteady, ataxic Patient leans: Backward  Psychiatric Specialty Exam: Physical Exam  Nursing note and vitals reviewed. Psychiatric: His speech is normal. Thought content normal. His affect is blunt. He is withdrawn. Cognition and memory are impaired. He expresses impulsivity.    Review of Systems  Musculoskeletal: Positive for falls.  Neurological: Positive for weakness and headaches.  Psychiatric/Behavioral: Positive for depression.  All other systems reviewed and are negative.  Blood pressure 113/65, pulse 73, temperature 97.8 F (36.6 C), temperature source Oral, resp. rate 20, height 6\' 2"  (1.88 m), weight 97.1 kg (214 lb), SpO2 98 %.Body mass index is 27.48 kg/m.  General Appearance: Fairly  Groomed  Eye Contact:  Good  Speech:  Clear and Coherent  Volume:  Normal  Mood:  Depressed  Affect:  Blunt  Thought Process:  Goal Directed and Descriptions of Associations: Intact  Orientation:  Full (Time, Place, and Person)  Thought Content:  WDL  Suicidal Thoughts:  Yes.  without intent/plan  Homicidal Thoughts:  No  Memory:  Immediate;   Fair Recent;   Fair Remote;   Fair  Judgement:  Impaired  Insight:  Shallow  Psychomotor Activity:  Decreased  Concentration:  Concentration: Fair and Attention Span: Fair  Recall:  [employer] of Knowledge:  Fair  Language:  Fair  Akathisia:  No  Handed:  Right  AIMS (if indicated):     Assets:  Communication Skills Desire for Improvement Resilience  ADL's:  Intact  Cognition:  WNL  Sleep:  Number of Hours: 6.15     Treatment Plan Summary: Daily contact with patient to assess and evaluate symptoms and progress in treatment and Medication management   Mark [name] is a 31 year old male with a history ofMDD, severe with suicidal ideation, type 1 diabetes and treatment noncompliance transferred from ICU where he was hospitalized for severe hypoglycemia from presumed insulin overdose.  #Suicidal ideation -patient able to contract for safety in the hospital  #Mood -continue Haldol 5 mg BID -continue Depakote 500 mg TID, VPA level 93 -ContinueEffexor225 mg daily -Continue Remeron 30 mg nightly -if discharged to the homeless shelter he may only take 2 psychotropic medications  #Diabetes, poorly controlled -Lantusincreased to 38 units, ADA diet, SSI, blood glucose monitoring -Appreciate recommendations from diabetes coordinator. -NovoLog insulin6units thrice daily as scheduled, hold if patient does not eat a meal or eats less than 50% of his meal  #Insomnia -Trazodone 100 mg nightly  #Social -patient has no income, health insurance, housing or support -will apply for SSDI and Medicaid on Monday with  help  #Falls -PT consult is appreciated -Head Ct scan revealed Severe obstructive hydrocephalus unchanged from 2013. Findings consistent with aqueductal stenosis.  -Neurosurgical consultation pending  #Disposition -TBE  Kristine Linea, MD 09/19/2017, 10:59 AM

## 2017-09-19 NOTE — Progress Notes (Signed)
CSW spoke with with Elita Booneachel Wade from financial counseling at [facility] [phone].  She has been in contact with Bonita QuinLinda at [facility], [phone], who is willing to meet with pt to initiate disability application.  CSW spoke with pt, who gave permission for CSW to contact [facility].  Bonita QuinLinda at [facility] said they can meet with pt at the financial counseling office to start the process on Monday, 12/3, at 10am.  CSW to make arrangements. Garner NashGregory Facundo Allemand, MSW, LCSW Clinical Social Worker 09/19/2017 10:09 AM

## 2017-09-19 NOTE — Progress Notes (Signed)
Inpatient Diabetes Program Recommendations  AACE/ADA: New Consensus Statement on Inpatient Glycemic Control (2015)  Target Ranges:  Prepandial:   less than 140 mg/dL      Peak postprandial:   less than 180 mg/dL (1-2 hours)      Critically ill patients:  140 - 180 mg/dL   Lab Results  Component Value Date   GLUCAP 305 (H) 09/19/2017   HGBA1C 8.7 (H) 09/11/2017    Review of Glycemic Control  Results for Mark [name], Mark Q (MRN [id number]) as of 09/19/2017 08:42  Ref. Range 09/18/2017 10:31 09/18/2017 12:52 09/18/2017 16:27 09/18/2017 20:36 09/19/2017 07:14  Glucose-Capillary Latest Ref Range: 65 - 99 mg/dL 829158 (H) 562369 (H) 130174 (H) 197 (H) 305 (H)    Home DM Meds:Lantus 38 units QHS Novolog 15 units TID  Current Insulin Orders:Lantus 36 units daily, Novolog 6 units TID, Novolog Sensitive Correction Scale/ SSI (0-9 units) TID AC + Novolog 0-5 units QHS  No changes recommended based on recent history.  At discharge consider Novolog 2 units at meal time if he skips a meal or eats less than 50%, 6 units Novolog if he eats more than 50% of a meal. Consider Lantus 38 units qhs.   Susette RacerJulie Harshil Cavallaro, RN, BA, MHA, CDE Diabetes Coordinator Inpatient Diabetes Program  [phone] (Team Pager) [phone] [facility] Office) 09/19/2017 8:43 AM

## 2017-09-19 NOTE — Plan of Care (Signed)
Pt. Able to remain safe while on the unit. Pt. Able to verbally contract for safety and states, "I don't want to hurt myself while I'm here". Pt. When asked states today that he feels, "better" than he did yesterday. Pt. Sleep reports indicate he is sleeping normally. Pt. Able to discuss emotions to this Clinical research associatewriter.

## 2017-09-20 DIAGNOSIS — R278 Other lack of coordination: Secondary | ICD-10-CM | POA: Diagnosis present

## 2017-09-20 LAB — GLUCOSE, CAPILLARY
GLUCOSE-CAPILLARY: 218 mg/dL — AB (ref 65–99)
GLUCOSE-CAPILLARY: 95 mg/dL (ref 65–99)
GLUCOSE-CAPILLARY: 314 mg/dL — AB (ref 65–99)
Glucose-Capillary: 383 mg/dL — ABNORMAL HIGH (ref 65–99)

## 2017-09-20 MED ORDER — VENLAFAXINE HCL ER 75 MG PO CP24
300.0000 mg | ORAL_CAPSULE | Freq: Every day | ORAL | Status: DC
  Administered 2017-09-21 – 2017-09-26 (×5): 300 mg via ORAL
  Filled 2017-09-20 (×6): qty 4

## 2017-09-20 NOTE — Progress Notes (Signed)
[facility]  09/20/2017 2:07 PM Mark [name]  MRN:  [id number]  Subjective:   Mark [name] is still depressed and unable to contract for safety upon discharge. He has been in bed most of the time as walking became increasingly problematic due to truncal ataxia and falls. Sleeps well. No side effects from medications. No group participation. Diabetes better controlled.  Treatment plan. We will continue all his medications: Haldol, Depakote, Effexor, Remeron, Neurontin for depression and mood stabilization. No need to adjust insulin today.  Social/disposition. Homeless, with no benefits or support. Meeting with financial services on Monday at 10:00 amd to apply for SSDI and Medicaid.  Principal Problem: Severe recurrent major depression without psychotic features (HCC) Diagnosis:   Patient Active Problem List   Diagnosis Date Noted  . 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 Obstructive hydrocephalus [G91.1] 09/19/2017  . Diabetes (HCC) [E11.9] 09/11/2017  . Hypoglycemia [E16.2] 09/09/2017  . MDD (major depressive disorder) [F32.9] 08/22/2017  . Severe recurrent major depression without psychotic features (HCC) [F33.2] 08/19/2017  . Nausea & vomiting [R11.2] 08/18/2017  . Abdominal pain [R10.9] 08/18/2017  . Hyponatremia [E87.1] 08/18/2017  . DKA (diabetic ketoacidoses) (HCC) [E13.10] 11/24/2015  . Diabetic keto-acidosis (HCC) [E13.10] 10/23/2015   Total Time spent with patient: 20 minutes  Past Psychiatric History: bipolar disorder  Past Medical History:  Past Medical History:  Diagnosis Date  . Diabetes mellitus without complication (HCC)   . Hydrocephalus   . Kidney stones     Past Surgical History:  Procedure Laterality Date  . KIDNEY STONE SURGERY     Family History:  Family History  Problem Relation Age of Onset  . Breast cancer Mother    Family Psychiatric  History: none reported Social History:  Social History   Substance and Sexual Activity  Alcohol  Use No   Comment: ocasional      Social History   Substance and Sexual Activity  Drug Use Yes  . Types: Marijuana   Comment: "as often as I can"    Social History   Socioeconomic History  . Marital status: Single    Spouse name: None  . Number of children: None  . Years of education: None  . Highest education level: None  Social Needs  . Financial resource strain: None  . Food insecurity - worry: None  . Food insecurity - inability: None  . Transportation needs - medical: None  . Transportation needs - non-medical: None  Occupational History  . None  Tobacco Use  . Smoking status: Current Every Day Smoker    Packs/day: 1.00    Types: Cigarettes  . Smokeless tobacco: Never Used  Substance and Sexual Activity  . Alcohol use: No    Comment: ocasional   . Drug use: Yes    Types: Marijuana    Comment: "as often as I can"  . Sexual activity: None  Other Topics Concern  . None  Social History Narrative  . None   Additional Social History:                         Sleep: Fair  Appetite:  Poor  Current Medications: Current Facility-Administered Medications  Medication Dose Route Frequency Provider Last Rate Last Dose  . acetaminophen (TYLENOL) tablet 650 mg  650 mg Oral Q6H PRN Clapacs, Jackquline DenmarkJohn T, MD   650 mg at 09/17/17 2127  . alum & mag hydroxide-simeth (MAALOX/MYLANTA) 200-200-20 MG/5ML suspension 30 mL  30 mL  Oral Q4H PRN Clapacs, [name] T, MD      . divalproex (DEPAKOTE) DR tablet 500 mg  500 mg Oral Q8H Leesha Veno B, MD   500 mg at 09/20/17 0611  . feeding supplement (GLUCERNA SHAKE) (GLUCERNA SHAKE) liquid 237 mL  237 mL Oral TID BM Jazsmine Macari B, MD   237 mL at 09/20/17 1000  . gabapentin (NEURONTIN) capsule 1,200 mg  1,200 mg Oral TID Tayelor Osborne B, MD   1,200 mg at 09/20/17 1157  . haloperidol (HALDOL) tablet 5 mg  5 mg Oral BID Dhani Dannemiller B, MD   5 mg at 09/20/17 0818  . hydrOXYzine (ATARAX/VISTARIL) tablet 25 mg  25  mg Oral Q4H PRN Aundria Rudakesh, Gopalkumar, MD   25 mg at 09/16/17 1708  . Influenza vac split quadrivalent PF (FLUARIX) injection 0.5 mL  0.5 mL Intramuscular Tomorrow-1000 Jamiesha Victoria B, MD      . insulin aspart (novoLOG) injection 0-5 Units  0-5 Units Subcutaneous QHS Auston Halfmann B, MD   4 Units at 09/19/17 2115  . insulin aspart (novoLOG) injection 0-9 Units  0-9 Units Subcutaneous TID WC Varetta Chavers B, MD   3 Units at 09/20/17 0826  . insulin aspart (novoLOG) injection 6 Units  6 Units Subcutaneous TID WC Aundria Rudakesh, Gopalkumar, MD   6 Units at 09/19/17 1708  . insulin glargine (LANTUS) injection 38 Units  38 Units Subcutaneous QHS Symphony Demuro B, MD   38 Units at 09/19/17 2111  . magnesium hydroxide (MILK OF MAGNESIA) suspension 30 mL  30 mL Oral Daily PRN Clapacs, [name] T, MD      . mirtazapine (REMERON) tablet 30 mg  30 mg Oral QHS Clapacs, Jackquline DenmarkJohn T, MD   30 mg at 09/19/17 2111  . nicotine (NICODERM CQ - dosed in mg/24 hours) patch 21 mg  21 mg Transdermal Daily Yadir Zentner B, MD   21 mg at 09/20/17 0820  . polyethylene glycol (MIRALAX / GLYCOLAX) packet 17 g  17 g Oral Daily PRN Clapacs, [name] T, MD      . traZODone (DESYREL) tablet 200 mg  200 mg Oral QHS Naziya Hegwood B, MD   200 mg at 09/19/17 2111  . venlafaxine XR (EFFEXOR-XR) 24 hr capsule 225 mg  225 mg Oral Q breakfast Aundria Rudakesh, Gopalkumar, MD   225 mg at 09/20/17 0818    Lab Results:  Results for orders placed or performed during the hospital encounter of 09/10/17 (from the past 48 hour(s))  Glucose, capillary     Status: Abnormal   Collection Time: 09/18/17  4:27 PM  Result Value Ref Range   Glucose-Capillary 174 (H) 65 - 99 mg/dL   Comment 1 Notify RN   Glucose, capillary     Status: Abnormal   Collection Time: 09/18/17  8:36 PM  Result Value Ref Range   Glucose-Capillary 197 (H) 65 - 99 mg/dL  Glucose, capillary     Status: Abnormal   Collection Time: 09/19/17  7:14 AM  Result Value Ref Range    Glucose-Capillary 305 (H) 65 - 99 mg/dL   Comment 1 Notify RN   Glucose, capillary     Status: Abnormal   Collection Time: 09/19/17 11:33 AM  Result Value Ref Range   Glucose-Capillary 161 (H) 65 - 99 mg/dL  Glucose, capillary     Status: Abnormal   Collection Time: 09/19/17  4:27 PM  Result Value Ref Range   Glucose-Capillary 238 (H) 65 - 99 mg/dL   Comment 1 Notify  RN   Glucose, capillary     Status: Abnormal   Collection Time: 09/19/17  9:06 PM  Result Value Ref Range   Glucose-Capillary 323 (H) 65 - 99 mg/dL  Glucose, capillary     Status: Abnormal   Collection Time: 09/20/17  7:01 AM  Result Value Ref Range   Glucose-Capillary 218 (H) 65 - 99 mg/dL  Glucose, capillary     Status: None   Collection Time: 09/20/17 11:31 AM  Result Value Ref Range   Glucose-Capillary 95 65 - 99 mg/dL   Comment 1 Notify RN     Blood Alcohol level:  Lab Results  Component Value Date   ETH <10 09/08/2017   ETH <10 08/18/2017    Metabolic Disorder Labs: Lab Results  Component Value Date   HGBA1C 8.7 (H) 09/11/2017   MPG 202.99 09/11/2017   No results found for: PROLACTIN Lab Results  Component Value Date   CHOL 217 (H) 09/11/2017   TRIG 135 09/11/2017   HDL 49 09/11/2017   CHOLHDL 4.4 09/11/2017   VLDL 27 09/11/2017   LDLCALC 141 (H) 09/11/2017   LDLCALC 216 (H) 08/23/2017    Physical Findings: AIMS: Facial and Oral Movements Muscles of Facial Expression: None, normal Lips and Perioral Area: None, normal Jaw: None, normal Tongue: None, normal,Extremity Movements Upper (arms, wrists, hands, fingers): None, normal Lower (legs, knees, ankles, toes): None, normal, Trunk Movements Neck, shoulders, hips: None, normal, Overall Severity Severity of abnormal movements (highest score from questions above): None, normal Incapacitation due to abnormal movements: None, normal Patient's awareness of abnormal movements (rate only patient's report): No Awareness, Dental Status Current  problems with teeth and/or dentures?: No Does patient usually wear dentures?: No  CIWA:    COWS:     Musculoskeletal: Strength & Muscle Tone: decreased Gait & Station: unsteady, ataxic, broad based Patient leans: Backward  Psychiatric Specialty Exam: Physical Exam  Nursing note and vitals reviewed. Psychiatric: His speech is normal. His affect is blunt. He is slowed and withdrawn. Cognition and memory are normal. He expresses impulsivity. He expresses suicidal ideation.    Review of Systems  Musculoskeletal: Positive for falls.  Neurological: Positive for weakness and headaches.  Psychiatric/Behavioral: Positive for depression and suicidal ideas.  All other systems reviewed and are negative.   Blood pressure 98/60, pulse 67, temperature 98 F (36.7 C), temperature source Oral, resp. rate 18, height 6\' 2"  (1.88 m), weight 97.1 kg (214 lb), SpO2 98 %.Body mass index is 27.48 kg/m.  General Appearance: Disheveled  Eye Contact:  Minimal  Speech:  Slow  Volume:  Decreased  Mood:  Depressed and Hopeless  Affect:  Blunt  Thought Process:  Goal Directed and Descriptions of Associations: Intact  Orientation:  Full (Time, Place, and Person)  Thought Content:  WDL  Suicidal Thoughts:  Yes.  with intent/plan  Homicidal Thoughts:  No  Memory:  Immediate;   Fair Recent;   Fair Remote;   Fair  Judgement:  Poor  Insight:  Lacking  Psychomotor Activity:  Psychomotor Retardation  Concentration:  Concentration: Fair and Attention Span: Fair  Recall:  [employer] of Knowledge:  Fair  Language:  Fair  Akathisia:  No  Handed:  Right  AIMS (if indicated):     Assets:  Communication Skills Desire for Improvement Resilience  ADL's:  Intact  Cognition:  WNL  Sleep:  Number of Hours: 6.15     Treatment Plan Summary: Daily contact with patient to assess and evaluate symptoms and  progress in treatment and Medication management   Mark [name] is a 31 year old male with a history ofMDD,  severe with suicidal ideation, type 1 diabetes and treatment noncompliance transferred from ICU where he was hospitalized for hypoglycemia from presumed insulin overdose which the patient denies. He is still suicidal and depressed.  #Suicidal ideation -patient able to contract for safety in the hospital  #Mood -continue Haldol 5 mg BID -continue Depakote 500 mg TID, VPA level 93 -increase Effexor to 300 mg daily -Continue Remeron 30 mg nightly -if discharged to the homeless shelter he may only take 2 psychotropic medications  #Diabetes, poorly controlled -Lantusincreased to 38 units, ADA diet, SSI, blood glucose monitoring -Appreciate recommendations from diabetes coordinator. -NovoLog insulin6units thrice daily as scheduled, hold if patient does not eat a meal or eats less than 50% of his meal  #Insomnia -Trazodone 100 mg nightly  #Social -patient has no income, health insurance, housing or support -will apply for SSDI and Medicaid on Monday with help  #Falls, truncal ataxia, hydrocephalus -PT consult is appreciated -Neurosurgery consult is appreciated -Head Ct scan revealed Severe obstructive hydrocephalus unchanged from 2013. Findings consistent with aqueductal stenosis.   #Disposition -TBE   Kristine Linea, MD 09/20/2017, 2:07 PM

## 2017-09-20 NOTE — Progress Notes (Signed)
Patient presented with 2 pm depakote. He states im not taking that til you find my wallet. He was told that every effort was made to find the wallet. This Clinical research associatewriter called several units to try to see if it was there in belongings. He states he is missing 20 dollars, his wallet and ID. He not states he is on a hunger strike and refusing all medications. He is helped to his room. Related to falling and being unsteady on his feet after refusing to use the wheel chair. In his room he apologized and states I'm just mad at the world right now. I really need my ID.

## 2017-09-20 NOTE — Progress Notes (Signed)
Recreation Therapy Notes    Date: 09/20/2017  Time: 1:00 pm   Location: Craft Room  Behavioral response: Appropriate  Intervention Topic: Problem Solving  Discussion/Intervention: Group content on today was focused on problem solving. The group described what problem solving is. Patients expressed how problems affect them and how they deal with problems. Individuals identified healthy ways to deal with problems. Patients explained what normally happens to them when they do not deal with problems. The group expressed reoccurring problems for them. The group participated in the intervention "Word Scramble" with their peers and experienced dealing with problems alone versus asking for help with a problem.  Clinical Observations/Feedback:  Patient came to group and described problem solving as solving issues. He stated that he meditates when he has a problem. While in group he was social with peers and staff and participated in the intervention.   Mark [name] LRT/CTRS        Mark [name] 09/20/2017 1:54 PM

## 2017-09-20 NOTE — Progress Notes (Signed)
Inpatient Diabetes Program Recommendations  AACE/ADA: New Consensus Statement on Inpatient Glycemic Control (2015)  Target Ranges:  Prepandial:   less than 140 mg/dL      Peak postprandial:   less than 180 mg/dL (1-2 hours)      Critically ill patients:  140 - 180 mg/dL   Lab Results  Component Value Date   GLUCAP 218 (H) 09/20/2017   HGBA1C 8.7 (H) 09/11/2017    Review of Glycemic Control  Results for Mark [name], Riyaan Q (MRN [id number]) as of 09/20/2017 08:30  Ref. Range 09/19/2017 07:14 09/19/2017 11:33 09/19/2017 16:27 09/19/2017 21:06 09/20/2017 07:01  Glucose-Capillary Latest Ref Range: 65 - 99 mg/dL 829305 (H) 562161 (H) 130238 (H) 323 (H) 218 (H)     Home DM Meds:Lantus 38 units QHS Novolog 15 units TID  Current Insulin Orders:Lantus 38 units daily, Novolog 6 units TID, Novolog Sensitive Correction Scale/ SSI (0-9 units) TID AC +Novolog 0-5 units QHS  No changes recommended based on recent history. Although CBG are slightly elevated- we have avoided hypoglycemia the last few days.  Staff- please ensure (per policy) that correction Novolog (0-9 units tid) is given within 1 hour of the blood sugars being taken- this will ensure accuracy of the dose and decrease the risk of hypoglycemia.   At discharge consider Novolog 2 units at meal time if he skips a meal or eats less than 50%, 8 units Novolog if he eats more than 50% of a meal. Consider Lantus 38 units qhs.   Susette RacerJulie Aseret Hoffman, RN, BA, MHA, CDE Diabetes Coordinator Inpatient Diabetes Program  [phone] (Team Pager) [phone] [facility] Office) 09/20/2017 8:33 AM

## 2017-09-20 NOTE — Plan of Care (Signed)
Patient has passive si, no plan states I'm safe in here. He is unsteady with his gait. He does have a pin alarm on but is alert enough to remove when he wants to get up. He is med compliant. No behavioral issues. Bs have been decent considering he skips breakfast. Will continue to monitor.

## 2017-09-20 NOTE — Progress Notes (Signed)
D: Pt-passive SI/ HI- contracts for safety. Pt is agitated with Clinical research associatewriter. Pt stated he wanted his Ambien tonight , pt was informed that he has not taken it in 4 days. Pt became irritable stating he did not want Clinical research associatewriter as his nurse. Pt was informed that when he ambulates he needs to use the wheelchair. Pt stated he understood, but was seen later walking around. Pt care was given to the other nurse on the unit due to the pt not responding to writer .   A: Pt was offered support and encouragement.  Pt was encourage to attend groups. Q 15 minute checks were done for safety.   R:Pt attends groups  Pt is taking medication. Pt receptive to treatment and safety maintained on unit.

## 2017-09-20 NOTE — [facility] Group Notes (Signed)
[facility] Group Notes:  (Nursing/MHT/Case Management/Adjunct)  Date:  09/20/2017  Time:  9:13 PM  Type of Therapy:  Evening Wrap-up Group  Participation Level:  Minimal  Participation Quality:  Attentive  Affect:  Flat  Cognitive: Drowsy   Insight:  Minimal  Engagement in Group:  Minimal  Modes of Intervention:  Discussion  Summary of Progress/Problems:  Mark MorrowChelsea Nanta [name] 09/20/2017, 9:13 PM

## 2017-09-20 NOTE — [facility] Group Notes (Signed)
[facility] Group Notes:  (Nursing/MHT/Case Management/Adjunct)  Date:  09/19/2017  Time:  7:27 PM  Type of Therapy:  Psychoeducational Skills  Participation Level:  Active  Participation Quality:  Appropriate, Attentive and Sharing  Affect:  Appropriate  Cognitive:  Appropriate  Insight:  Appropriate and Good  Engagement in Group:  Engaged  Modes of Intervention:  Discussion, Socialization and Support  Summary of Progress/Problems:  Mark MilroyLaquanda Y Tally [name] 09/20/2017, 7:27 PM

## 2017-09-21 LAB — GLUCOSE, CAPILLARY
GLUCOSE-CAPILLARY: 78 mg/dL (ref 65–99)
GLUCOSE-CAPILLARY: 167 mg/dL — AB (ref 65–99)
Glucose-Capillary: 145 mg/dL — ABNORMAL HIGH (ref 65–99)
Glucose-Capillary: 198 mg/dL — ABNORMAL HIGH (ref 65–99)

## 2017-09-21 NOTE — Progress Notes (Signed)
Inpatient Diabetes Program Recommendations  AACE/ADA: New Consensus Statement on Inpatient Glycemic Control (2015)  Target Ranges:  Prepandial:   less than 140 mg/dL      Peak postprandial:   less than 180 mg/dL (1-2 hours)      Critically ill patients:  140 - 180 mg/dL   Lab Results  Component Value Date   GLUCAP 78 09/21/2017   HGBA1C 8.7 (H) 09/11/2017    Review of Glycemic Control  Results for Mark [name], Brace Q (MRN [id number]) as of 09/21/2017 07:55  Ref. Range 09/20/2017 07:01 09/20/2017 11:31 09/20/2017 16:39 09/20/2017 20:12 09/21/2017 06:54  Glucose-Capillary Latest Ref Range: 65 - 99 mg/dL 409218 (H) 95 811314 (H) 914383 (H) 78    Home DM Meds:Lantus 38 units QHS Novolog 15 units TID  Current Insulin Orders:Lantus38units daily,Novolog 6 units TID,Novolog Sensitive Correction Scale/ SSI (0-9 units) TID AC +Novolog 0-5 units QHS  FBS 78 mg/dl this morning.  I believe it is a result of no mealtime insulin being given at breakfast or  lunch yesterday , which caused high supper blood sugars.  This resulted in the RN needing to give a larger dose of correction at  Supper and hs, resulting in the lower number this am.    It is critical that this patient takes his mealtime insulin if he eats- he makes NO insulin.  We do not hold mealtime insulin (Novolog 6 units tid) if he eats.   Staff- please ensure (per policy) that correction Novolog (0-9 units tid) is given within 1 hour of the blood sugars being taken- this will ensure accuracy of the dose and decrease the risk of hypoglycemia.   At discharge consider Novolog 2 units at meal time if he skips a meal or eats less than 50%, 8 units Novolog if he eats more than 50% of a meal. Consider Lantus 38 units qhs  Susette RacerJulie Hezikiah Retzloff, RN, [place], [place]HA, CDE Diabetes Coordinator Inpatient Diabetes Program  [phone] (Team Pager) [phone] [facility] Office) 09/21/2017 8:09 AM

## 2017-09-21 NOTE — Progress Notes (Addendum)
Up in the dayroom, continues to endorse anxiety and depression rated 6/10, and some passive suicidal thoughts. Continues to refuse to use the w/c and needed 2 assist to get to his room after having gotten his HS meds. Pt was asking for ambien for sleep tonight. CS was 167. Attended group and had snack. Bed alarm is on. Remains on routine obs for safety. 0410 Incontinent of a large amount of urine.

## 2017-09-21 NOTE — Progress Notes (Signed)
[facility] MD Progress Note  09/21/2017 10:26 AM [name]  MRN:  [name]  Subjective:   Mark [name] states that he is still depressed.  He did not sleep well "because it's a hospital."  Per nursing he still dislikes using the wheelchair and tries to walk.  I talked to him about using the wheelchair and he states he doesn't like it, he thinks his gait is fine and it's just standing still that is problematic for him.  We review the risks of falls.  He states his mood is 7/10 today, which is improved, but he is still unable to contract for safety upon discharge.   Treatment plan. We will continue all his medications: Haldol, Depakote, Effexor, Remeron, Neurontin for depression and mood stabilization. No need to adjust insulin today.  Social/disposition. Homeless, with no benefits or support. Meeting with financial services on Monday at 10:00 amd to apply for SSDI and Medicaid.  Principal Problem: Severe recurrent major depression without psychotic features (HCC) Diagnosis:   Patient Active Problem List   Diagnosis Date Noted  . 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 Obstructive hydrocephalus [G91.1] 09/19/2017  . Diabetes (HCC) [E11.9] 09/11/2017  . Hypoglycemia [E16.2] 09/09/2017  . MDD (major depressive disorder) [F32.9] 08/22/2017  . Severe recurrent major depression without psychotic features (HCC) [F33.2] 08/19/2017  . Nausea & vomiting [R11.2] 08/18/2017  . Abdominal pain [R10.9] 08/18/2017  . Hyponatremia [E87.1] 08/18/2017  . DKA (diabetic ketoacidoses) (HCC) [E13.10] 11/24/2015  . Diabetic keto-acidosis (HCC) [E13.10] 10/23/2015   Total Time spent with patient: 20 minutes  Past Psychiatric History: bipolar disorder  Past Medical History:  Past Medical History:  Diagnosis Date  . Diabetes mellitus without complication (HCC)   . Hydrocephalus   . Kidney stones     Past Surgical History:  Procedure Laterality Date  . KIDNEY STONE SURGERY     Family History:  Family  History  Problem Relation Age of Onset  . Breast cancer Mother    Family Psychiatric  History: none reported Social History:  Social History   Substance and Sexual Activity  Alcohol Use No   Comment: ocasional      Social History   Substance and Sexual Activity  Drug Use Yes  . Types: Marijuana   Comment: "as often as I can"    Social History   Socioeconomic History  . Marital status: Single    Spouse name: None  . Number of children: None  . Years of education: None  . Highest education level: None  Social Needs  . Financial resource strain: None  . Food insecurity - worry: None  . Food insecurity - inability: None  . Transportation needs - medical: None  . Transportation needs - non-medical: None  Occupational History  . None  Tobacco Use  . Smoking status: Current Every Day Smoker    Packs/day: 1.00    Types: Cigarettes  . Smokeless tobacco: Never Used  Substance and Sexual Activity  . Alcohol use: No    Comment: ocasional   . Drug use: Yes    Types: Marijuana    Comment: "as often as I can"  . Sexual activity: None  Other Topics Concern  . None  Social History Narrative  . None   Additional Social History:                         Sleep: Fair  Appetite:  Poor  Current Medications: Current Facility-Administered Medications  Medication  Dose Route Frequency Provider Last Rate Last Dose  . acetaminophen (TYLENOL) tablet 650 mg  650 mg Oral Q6H PRN Clapacs, Jackquline Denmark, MD   650 mg at 09/17/17 2127  . alum & mag hydroxide-simeth (MAALOX/MYLANTA) 200-200-20 MG/5ML suspension 30 mL  30 mL Oral Q4H PRN Clapacs, [name] T, MD      . divalproex (DEPAKOTE) DR tablet 500 mg  500 mg Oral Q8H Pucilowska, Jolanta B, MD   500 mg at 09/21/17 0614  . feeding supplement (GLUCERNA SHAKE) (GLUCERNA SHAKE) liquid 237 mL  237 mL Oral TID BM Pucilowska, Jolanta B, MD   237 mL at 09/20/17 2019  . gabapentin (NEURONTIN) capsule 1,200 mg  1,200 mg Oral TID Pucilowska,  Jolanta B, MD   1,200 mg at 09/21/17 0811  . haloperidol (HALDOL) tablet 5 mg  5 mg Oral BID Pucilowska, Jolanta B, MD   5 mg at 09/21/17 0811  . hydrOXYzine (ATARAX/VISTARIL) tablet 25 mg  25 mg Oral Q4H PRN Aundria Rud, MD   25 mg at 09/21/17 0814  . Influenza vac split quadrivalent PF (FLUARIX) injection 0.5 mL  0.5 mL Intramuscular Tomorrow-1000 Pucilowska, Jolanta B, MD      . insulin aspart (novoLOG) injection 0-5 Units  0-5 Units Subcutaneous QHS Pucilowska, Jolanta B, MD   5 Units at 09/20/17 2131  . insulin aspart (novoLOG) injection 0-9 Units  0-9 Units Subcutaneous TID WC Pucilowska, Jolanta B, MD   Stopped at 09/21/17 0824  . insulin aspart (novoLOG) injection 6 Units  6 Units Subcutaneous TID WC Aundria Rud, MD   Stopped at 09/21/17 [id number]  . insulin glargine (LANTUS) injection 38 Units  38 Units Subcutaneous QHS Pucilowska, Jolanta B, MD   38 Units at 09/20/17 2130  . magnesium hydroxide (MILK OF MAGNESIA) suspension 30 mL  30 mL Oral Daily PRN Clapacs, [name] T, MD      . mirtazapine (REMERON) tablet 30 mg  30 mg Oral QHS Clapacs, Jackquline Denmark, MD   30 mg at 09/20/17 2131  . nicotine (NICODERM CQ - dosed in mg/24 hours) patch 21 mg  21 mg Transdermal Daily Pucilowska, Jolanta B, MD   21 mg at 09/21/17 0814  . polyethylene glycol (MIRALAX / GLYCOLAX) packet 17 g  17 g Oral Daily PRN Clapacs, [name] T, MD      . traZODone (DESYREL) tablet 200 mg  200 mg Oral QHS Pucilowska, Jolanta B, MD   200 mg at 09/20/17 2131  . venlafaxine XR (EFFEXOR-XR) 24 hr capsule 300 mg  300 mg Oral Q breakfast Pucilowska, Jolanta B, MD   300 mg at 09/21/17 [id number]    Lab Results:  Results for orders placed or performed during the hospital encounter of 09/10/17 (from the past 48 hour(s))  Glucose, capillary     Status: Abnormal   Collection Time: 09/19/17 11:33 AM  Result Value Ref Range   Glucose-Capillary 161 (H) 65 - 99 mg/dL  Glucose, capillary     Status: Abnormal   Collection Time: 09/19/17  4:27  PM  Result Value Ref Range   Glucose-Capillary 238 (H) 65 - 99 mg/dL   Comment 1 Notify RN   Glucose, capillary     Status: Abnormal   Collection Time: 09/19/17  9:06 PM  Result Value Ref Range   Glucose-Capillary 323 (H) 65 - 99 mg/dL  Glucose, capillary     Status: Abnormal   Collection Time: 09/20/17  7:01 AM  Result Value Ref Range   Glucose-Capillary 218 (H) 65 -  99 mg/dL  Glucose, capillary     Status: None   Collection Time: 09/20/17 11:31 AM  Result Value Ref Range   Glucose-Capillary 95 65 - 99 mg/dL   Comment 1 Notify RN   Glucose, capillary     Status: Abnormal   Collection Time: 09/20/17  4:39 PM  Result Value Ref Range   Glucose-Capillary 314 (H) 65 - 99 mg/dL  Glucose, capillary     Status: Abnormal   Collection Time: 09/20/17  8:12 PM  Result Value Ref Range   Glucose-Capillary 383 (H) 65 - 99 mg/dL   Comment 1 Notify RN   Glucose, capillary     Status: None   Collection Time: 09/21/17  6:54 AM  Result Value Ref Range   Glucose-Capillary 78 65 - 99 mg/dL   Comment 1 Notify RN     Blood Alcohol level:  Lab Results  Component Value Date   ETH <10 09/08/2017   ETH <10 08/18/2017    Metabolic Disorder Labs: Lab Results  Component Value Date   HGBA1C 8.7 (H) 09/11/2017   MPG 202.99 09/11/2017   No results found for: PROLACTIN Lab Results  Component Value Date   CHOL 217 (H) 09/11/2017   TRIG 135 09/11/2017   HDL 49 09/11/2017   CHOLHDL 4.4 09/11/2017   VLDL 27 09/11/2017   LDLCALC 141 (H) 09/11/2017   LDLCALC 216 (H) 08/23/2017    Physical Findings: AIMS: Facial and Oral Movements Muscles of Facial Expression: None, normal Lips and Perioral Area: None, normal Jaw: None, normal Tongue: None, normal,Extremity Movements Upper (arms, wrists, hands, fingers): None, normal Lower (legs, knees, ankles, toes): None, normal, Trunk Movements Neck, shoulders, hips: None, normal, Overall Severity Severity of abnormal movements (highest score from  questions above): None, normal Incapacitation due to abnormal movements: None, normal Patient's awareness of abnormal movements (rate only patient's report): No Awareness, Dental Status Current problems with teeth and/or dentures?: No Does patient usually wear dentures?: No  CIWA:    COWS:     Musculoskeletal: Strength & Muscle Tone: decreased Gait & Station: unsteady, ataxic, broad based Patient leans: Backward  Psychiatric Specialty Exam: Physical Exam  Nursing note and vitals reviewed. Psychiatric: His speech is normal. His affect is blunt. He is slowed and withdrawn. Cognition and memory are normal.    Review of Systems  Musculoskeletal: Positive for falls.  Neurological: Positive for weakness and headaches.  Psychiatric/Behavioral: Positive for depression and suicidal ideas.  All other systems reviewed and are negative.   Blood pressure 113/66, pulse 78, temperature 97.8 F (36.6 C), temperature source Oral, resp. rate 18, height 6\' 2"  (1.88 m), weight 97.1 kg (214 lb), SpO2 98 %.Body mass index is 27.48 kg/m.  General Appearance: Disheveled  Eye Contact:  Fair  Speech:  Slow  Volume:  Decreased  Mood:  Depressed  Affect:  Blunt  Thought Process:  Goal Directed and Descriptions of Associations: Intact  Orientation:  Full (Time, Place, and Person)  Thought Content:  WDL  Suicidal Thoughts:  Yes.  with intent/plan  Homicidal Thoughts:  No  Memory:  Immediate;   Fair Recent;   Fair Remote;   Fair  Judgement:  Poor  Insight:  Lacking  Psychomotor Activity:  Psychomotor Retardation  Concentration:  Concentration: Fair and Attention Span: Fair  Recall:  [employer]Fair  Fund of Knowledge:  Fair  Language:  Fair  Akathisia:  No  Handed:  Right  AIMS (if indicated):     Assets:  Communication Skills Desire  for Improvement Resilience  ADL's:  Intact  Cognition:  WNL  Sleep:  Number of Hours: 6.45     Treatment Plan Summary: Daily contact with patient to assess and  evaluate symptoms and progress in treatment and Medication management   Mark [name] is a 31 year old male with a history ofMDD, severe with suicidal ideation, type 1 diabetes and treatment noncompliance transferred from ICU where he was hospitalized for hypoglycemia from presumed insulin overdose which the patient denies. He is still suicidal and depressed.  #Suicidal ideation -patient able to contract for safety in the hospital  #Mood -continue Haldol 5 mg BID -continue Depakote 500 mg TID, VPA level 93 -continue Effexor 300 mg daily -Continue Remeron 30 mg nightly -if discharged to the homeless shelter he may only take 2 psychotropic medications  #Diabetes, poorly controlled -Lantusincreased to 38 units, ADA diet, SSI, blood glucose monitoring -Appreciate recommendations from diabetes coordinator. -NovoLog insulin6units thrice daily as scheduled, hold if patient does not eat a meal or eats less than 50% of his meal  #Insomnia -Trazodone 100 mg nightly  #Social -patient has no income, health insurance, housing or support -will apply for SSDI and Medicaid on Monday with help  #Falls, truncal ataxia, hydrocephalus -PT consult is appreciated -Neurosurgery consult is appreciated -Head Ct scan revealed Severe obstructive hydrocephalus unchanged from 2013. Findings consistent with aqueductal stenosis.   #Disposition -TBE   Mark LameLauren M Ayumi Wangerin, MD 09/21/2017, 10:26 AM

## 2017-09-21 NOTE — [facility] Group Notes (Signed)
09/21/2017 1:15pm  Type of Therapy and Topic:  Group Therapy:  Healthy Self Image and Positive Change  Participation Level:  Active   Description of Group:  In this group, patients will compare and contrast their current "I am...." statements to the visions they identify as desirable for their lives.  Patients discuss fears and how they can make positive changes in their cognitions that will positively impact their behaviors.  Facilitator played a motivational 3-minute speech and patients were left with the task of thinking about what "I am...." statements they can start using in their lives immediately.  Therapeutic Goals: 1. Patient will state their current self-perception as expressed in an "I Am" statement 2. Patient will contrast this with their desired vision for their live 3. Patient will identify 3 fears that negatively impact their behavior 4. Patient will discuss cognitive distortions that stem from their fears 5. Patient will verbalize statements that challenge their cognitive distortions  Summary of Patient Progress:  The patient voiced his feelings about being in the hospital. He expressed in an "I am" statement, " I am a pretty good drawer although it does not do anything for me" He identified not being accepted by others as one of his biggest fears. Pt stated that he is trying to stay positive. Pt fully engaged in group discussion.      Therapeutic Modalities Cognitive Behavioral Therapy Motivational Interviewing  Avnoor Koury  CUEBAS-COLON, LCSW 09/21/2017 11:30 AM

## 2017-09-21 NOTE — Plan of Care (Signed)
Patient continues to endorse passive SI with no plan but does contract for safety at this time with staff. Patient rates his anxiety level as a "6/10" and his depression as a "7/10" because "I don't know where I'm going". Patient also reports that "my situation" has a lot to do with why he's feeling this way. Patient has been encouraged to use wheelchair because of his unsteady gait, in which he does use the wheelchair occasionally but sometimes prefers to walk even though staff continues to encourage patient to use this assistive device.

## 2017-09-21 NOTE — Progress Notes (Signed)
D- Patient alert and oriented. Patient presents in a groggy/depressed mood. Patient states that he didn't sleep well last night. Patient continues to endorse passive SI without a plan. Patient rates his depression as a "7/10" and his anxiety as a "6/10" because "I don't know where I'm going". Patient has been ordered to use a wheelchair because of his unsteady gait, which he does use occasionally. Staff continues to encourage the use of the wheelchair but patient states "it's easier without it". Staff keeps encouraging patient and educating patient on the importance of it's use and that it's for his safety. Patient denies HI, AVH, and pain at this time.  A- Scheduled medications administered to patient, per MD orders. Support and encouragement provided.  Routine safety checks conducted every 15 minutes.  Patient informed to notify staff with problems or concerns.  R- No adverse drug reactions noted. Patient contracts for safety at this time. Patient compliant with medications and treatment plan. Patient receptive, calm, and cooperative. Patient interacts well with others on the unit.  Patient remains safe at this time.

## 2017-09-21 NOTE — [facility] Group Notes (Signed)
[facility] Group Notes:  (Nursing/MHT/Case Management/Adjunct)  Date:  09/21/2017  Time:  9:15 PM  Type of Therapy:  Group Therapy  Participation Level:  Active  Participation Quality:  Appropriate  Affect:  Appropriate  Cognitive:  Appropriate  Insight:  Good  Engagement in Group:  Engaged  Modes of Intervention:  Support  Summary of Progress/Problems:  Mark NeerJackie L Jacquese Hackman 09/21/2017, 9:15 PM

## 2017-09-22 LAB — GLUCOSE, CAPILLARY
GLUCOSE-CAPILLARY: 75 mg/dL (ref 65–99)
GLUCOSE-CAPILLARY: 310 mg/dL — AB (ref 65–99)
Glucose-Capillary: 220 mg/dL — ABNORMAL HIGH (ref 65–99)
Glucose-Capillary: 306 mg/dL — ABNORMAL HIGH (ref 65–99)

## 2017-09-22 NOTE — Progress Notes (Addendum)
Pleasant on contact, some spontaneous smiles.Denies SI, HI or AVH.Mark [name].  Visible and social in the milieu with peers, watching TV. Attended group.  2200 Pt was unsteady and was  assisted to the floor by peers and staff. He denied any injury.

## 2017-09-22 NOTE — Progress Notes (Signed)
D- Patient alert and oriented. Patient presents very agitated this morning because staff continues to encourage him to use the wheelchair. Patient states "I don't care what doctor writes an order, I'm not getting in it". Patient also states "how will my legs get stronger if I'm in a wheelchair". Patient reports that his depression and anxiety level is at a "9/10" because staff keeps telling him he needs to use the wheelchair. Patient continues to endorse passive SI without a plan reporting he has thoughts to hurt himself. Patient denies HI, AVH, and pain at this time.  A- Scheduled medications administered to patient, per MD orders. Support and encouragement provided.  Routine safety checks conducted every 15 minutes.  Patient informed to notify staff with problems or concerns.  R- No adverse drug reactions noted. Patient contracts for safety at this time. Patient compliant with medications and treatment plan. Patient receptive, calm, and cooperative. Patient interacts well with others on the unit.  Patient remains safe at this time.

## 2017-09-22 NOTE — [facility] Group Notes (Signed)
09/22/2017 1:15pm  Type of Therapy and Topic: Group Therapy: Feelings Around Returning Home & Establishing a Supportive Framework and Supporting Oneself When Supports Not Available  Participation Level: Minimal  Description of Group:  Patients first processed thoughts and feelings about upcoming discharge. These included fears of upcoming changes, lack of change, new living environments, judgements and expectations from others and overall stigma of mental health issues. The group then discussed the definition of a supportive framework, what that looks and feels like, and how do to discern it from an unhealthy non-supportive network. The group identified different types of supports as well as what to do when your family/friends are less than helpful or unavailable  Therapeutic Goals  1. Patient will identify one healthy supportive network that they can use at discharge. 2. Patient will identify one factor of a supportive framework and how to tell it from an unhealthy network. 3. Patient able to identify one coping skill to use when they do not have positive supports from others. 4. Patient will demonstrate ability to communicate their needs through discussion and/or role plays.  Summary of Patient Progress: Pt shared his concerns about being discharge and not having a place to stay. Pt left group after sharing his concerns.    Therapeutic Modalities Cognitive Behavioral Therapy Motivational Interviewing   Francisco Ostrovsky  CUEBAS-COLON, LCSW 09/22/2017 12:01 PM

## 2017-09-22 NOTE — Plan of Care (Signed)
Patient states that he slept fine last night. Patient denies any HI/AVH at this time. Patient verbalizes understanding of information given to him about treatment plan even though he has no desire to use the wheelchair to help with his unsteady gait. Patient has been free from injury for a few days now and states that he wants to build up his strength. Patient continues to endorse passive SI at this time without a plan. Patient is safe on the unit.

## 2017-09-22 NOTE — Progress Notes (Addendum)
[facility] MD Progress Note  09/22/2017 1:51 PM Mark [name]  MRN:  [id number]  Subjective:   Mark [name] states his his mood is poor.  He states he feels hopeless without a job and income.  Endorses passive SI but does not have a plan.  Also does not want to walk with the walker or wheelchair, states it's not for him , "if I fall that's on me." States his sleep was just okay, because he does not want to be here..   Treatment plan. We will continue all his medications: Haldol, Depakote, Effexor, Remeron, Neurontin for depression and mood stabilization. No need to adjust insulin today.  Social/disposition. Homeless, with no benefits or support. Meeting with financial services on Monday at 10:00 amd to apply for SSDI and Medicaid.  Principal Problem: Severe recurrent major depression without psychotic features (HCC) Diagnosis:   Patient Active Problem List   Diagnosis Date Noted  . 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 Obstructive hydrocephalus [G91.1] 09/19/2017  . Diabetes (HCC) [E11.9] 09/11/2017  . Hypoglycemia [E16.2] 09/09/2017  . MDD (major depressive disorder) [F32.9] 08/22/2017  . Severe recurrent major depression without psychotic features (HCC) [F33.2] 08/19/2017  . Nausea & vomiting [R11.2] 08/18/2017  . Abdominal pain [R10.9] 08/18/2017  . Hyponatremia [E87.1] 08/18/2017  . DKA (diabetic ketoacidoses) (HCC) [E13.10] 11/24/2015  . Diabetic keto-acidosis (HCC) [E13.10] 10/23/2015   Total Time spent with patient: 20 minutes  Past Psychiatric History: bipolar disorder  Past Medical History:  Past Medical History:  Diagnosis Date  . Diabetes mellitus without complication (HCC)   . Hydrocephalus   . Kidney stones     Past Surgical History:  Procedure Laterality Date  . KIDNEY STONE SURGERY     Family History:  Family History  Problem Relation Age of Onset  . Breast cancer Mother    Family Psychiatric  History: none reported Social History:  Social History    Substance and Sexual Activity  Alcohol Use No   Comment: ocasional      Social History   Substance and Sexual Activity  Drug Use Yes  . Types: Marijuana   Comment: "as often as I can"    Social History   Socioeconomic History  . Marital status: Single    Spouse name: None  . Number of children: None  . Years of education: None  . Highest education level: None  Social Needs  . Financial resource strain: None  . Food insecurity - worry: None  . Food insecurity - inability: None  . Transportation needs - medical: None  . Transportation needs - non-medical: None  Occupational History  . None  Tobacco Use  . Smoking status: Current Every Day Smoker    Packs/day: 1.00    Types: Cigarettes  . Smokeless tobacco: Never Used  Substance and Sexual Activity  . Alcohol use: No    Comment: ocasional   . Drug use: Yes    Types: Marijuana    Comment: "as often as I can"  . Sexual activity: None  Other Topics Concern  . None  Social History Narrative  . None   Additional Social History:                         Sleep: Fair  Appetite:  Poor  Current Medications: Current Facility-Administered Medications  Medication Dose Route Frequency Provider Last Rate Last Dose  . acetaminophen (TYLENOL) tablet 650 mg  650 mg Oral Q6H PRN Clapacs, Jackquline Denmark, MD  650 mg at 09/17/17 2127  . alum & mag hydroxide-simeth (MAALOX/MYLANTA) 200-200-20 MG/5ML suspension 30 mL  30 mL Oral Q4H PRN Clapacs, [name] T, MD      . divalproex (DEPAKOTE) DR tablet 500 mg  500 mg Oral Q8H Yahye Siebert B, MD   500 mg at 09/22/17 0606  . feeding supplement (GLUCERNA SHAKE) (GLUCERNA SHAKE) liquid 237 mL  237 mL Oral TID BM Sharisse Rantz B, MD   237 mL at 09/20/17 2019  . gabapentin (NEURONTIN) capsule 1,200 mg  1,200 mg Oral TID Mycal Conde B, MD   1,200 mg at 09/22/17 1219  . haloperidol (HALDOL) tablet 5 mg  5 mg Oral BID Ellean Firman B, MD   5 mg at 09/22/17 0833  .  hydrOXYzine (ATARAX/VISTARIL) tablet 25 mg  25 mg Oral Q4H PRN Aundria Rud, MD   25 mg at 09/21/17 0814  . Influenza vac split quadrivalent PF (FLUARIX) injection 0.5 mL  0.5 mL Intramuscular Tomorrow-1000 Kaleigha Chamberlin B, MD      . insulin aspart (novoLOG) injection 0-5 Units  0-5 Units Subcutaneous QHS Candida Vetter B, MD   5 Units at 09/20/17 2131  . insulin aspart (novoLOG) injection 0-9 Units  0-9 Units Subcutaneous TID WC Madilyne Tadlock B, MD   Stopped at 09/22/17 1217  . insulin aspart (novoLOG) injection 6 Units  6 Units Subcutaneous TID WC Aundria Rud, MD   Stopped at 09/22/17 1218  . insulin glargine (LANTUS) injection 38 Units  38 Units Subcutaneous QHS Purity Irmen B, MD   38 Units at 09/21/17 2109  . magnesium hydroxide (MILK OF MAGNESIA) suspension 30 mL  30 mL Oral Daily PRN Clapacs, [name] T, MD      . mirtazapine (REMERON) tablet 30 mg  30 mg Oral QHS Clapacs, Jackquline Denmark, MD   30 mg at 09/21/17 2109  . nicotine (NICODERM CQ - dosed in mg/24 hours) patch 21 mg  21 mg Transdermal Daily Branndon Tuite B, MD   21 mg at 09/22/17 0845  . polyethylene glycol (MIRALAX / GLYCOLAX) packet 17 g  17 g Oral Daily PRN Clapacs, [name] T, MD      . traZODone (DESYREL) tablet 200 mg  200 mg Oral QHS Dylana Shaw B, MD   200 mg at 09/21/17 2109  . venlafaxine XR (EFFEXOR-XR) 24 hr capsule 300 mg  300 mg Oral Q breakfast Maryori Weide B, MD   300 mg at 09/22/17 1610    Lab Results:  Results for orders placed or performed during the hospital encounter of 09/10/17 (from the past 48 hour(s))  Glucose, capillary     Status: Abnormal   Collection Time: 09/20/17  4:39 PM  Result Value Ref Range   Glucose-Capillary 314 (H) 65 - 99 mg/dL  Glucose, capillary     Status: Abnormal   Collection Time: 09/20/17  8:12 PM  Result Value Ref Range   Glucose-Capillary 383 (H) 65 - 99 mg/dL   Comment 1 Notify RN   Glucose, capillary     Status: None   Collection  Time: 09/21/17  6:54 AM  Result Value Ref Range   Glucose-Capillary 78 65 - 99 mg/dL   Comment 1 Notify RN   Glucose, capillary     Status: Abnormal   Collection Time: 09/21/17 11:31 AM  Result Value Ref Range   Glucose-Capillary 145 (H) 65 - 99 mg/dL   Comment 1 Notify RN    Comment 2 Document in Chart   Glucose, capillary  Status: Abnormal   Collection Time: 09/21/17  4:13 PM  Result Value Ref Range   Glucose-Capillary 198 (H) 65 - 99 mg/dL   Comment 1 Notify RN   Glucose, capillary     Status: Abnormal   Collection Time: 09/21/17  9:05 PM  Result Value Ref Range   Glucose-Capillary 167 (H) 65 - 99 mg/dL  Glucose, capillary     Status: Abnormal   Collection Time: 09/22/17  7:11 AM  Result Value Ref Range   Glucose-Capillary 220 (H) 65 - 99 mg/dL   Comment 1 Notify RN   Glucose, capillary     Status: None   Collection Time: 09/22/17 11:23 AM  Result Value Ref Range   Glucose-Capillary 75 65 - 99 mg/dL    Blood Alcohol level:  Lab Results  Component Value Date   ETH <10 09/08/2017   ETH <10 08/18/2017    Metabolic Disorder Labs: Lab Results  Component Value Date   HGBA1C 8.7 (H) 09/11/2017   MPG 202.99 09/11/2017   No results found for: PROLACTIN Lab Results  Component Value Date   CHOL 217 (H) 09/11/2017   TRIG 135 09/11/2017   HDL 49 09/11/2017   CHOLHDL 4.4 09/11/2017   VLDL 27 09/11/2017   LDLCALC 141 (H) 09/11/2017   LDLCALC 216 (H) 08/23/2017    Physical Findings: AIMS: Facial and Oral Movements Muscles of Facial Expression: None, normal Lips and Perioral Area: None, normal Jaw: None, normal Tongue: None, normal,Extremity Movements Upper (arms, wrists, hands, fingers): None, normal Lower (legs, knees, ankles, toes): None, normal, Trunk Movements Neck, shoulders, hips: None, normal, Overall Severity Severity of abnormal movements (highest score from questions above): None, normal Incapacitation due to abnormal movements: None, normal Patient's  awareness of abnormal movements (rate only patient's report): No Awareness, Dental Status Current problems with teeth and/or dentures?: No Does patient usually wear dentures?: No  CIWA:    COWS:     Musculoskeletal: Strength & Muscle Tone: decreased Gait & Station: unsteady, ataxic, broad based Patient leans: Backward  Psychiatric Specialty Exam: Physical Exam  Nursing note and vitals reviewed. Psychiatric: His speech is normal. His affect is blunt. He is slowed and withdrawn. Cognition and memory are normal.    Review of Systems  Musculoskeletal: Positive for falls.  Neurological: Positive for weakness and headaches.  Psychiatric/Behavioral: Positive for depression and suicidal ideas.  All other systems reviewed and are negative.   Blood pressure (!) 98/56, pulse 72, temperature 97.8 F (36.6 C), temperature source Oral, resp. rate 18, height 6\' 2"  (1.88 m), weight 97.1 kg (214 lb), SpO2 98 %.Body mass index is 27.48 kg/m.  General Appearance: Disheveled  Eye Contact:  Fair  Speech:  Slow  Volume:  Decreased  Mood:  Depressed  Affect:  Blunt  Thought Process:  Goal Directed and Descriptions of Associations: Intact  Orientation:  Full (Time, Place, and Person)  Thought Content:  WDL  Suicidal Thoughts:  Yes.  with intent/plan  Homicidal Thoughts:  No  Memory:  Immediate;   Fair Recent;   Fair Remote;   Fair  Judgement:  Poor  Insight:  Lacking  Psychomotor Activity:  Psychomotor Retardation  Concentration:  Concentration: Fair and Attention Span: Fair  Recall:  [employer]  Fund of Knowledge:  Fair  Language:  Fair  Akathisia:  No  Handed:  Right  AIMS (if indicated):     Assets:  Communication Skills Desire for Improvement Resilience  ADL's:  Intact  Cognition:  WNL  Sleep:  Number of Hours: 7.3     Treatment Plan Summary: Daily contact with patient to assess and evaluate symptoms and progress in treatment and Medication management   Mark [name] is a 31 year old  male with a history ofMDD, severe with suicidal ideation, type 1 diabetes and treatment noncompliance transferred from ICU where he was hospitalized for hypoglycemia from presumed insulin overdose which the patient denies. He is still suicidal and depressed.  #Suicidal ideation -patient able to contract for safety in the hospital  #Mood -continue Haldol 5 mg BID -continue Depakote 500 mg TID, VPA level 93 -continue Effexor 300 mg daily -Continue Remeron 30 mg nightly -if discharged to the homeless shelter he may only take 2 psychotropic medications  #Diabetes, poorly controlled -Lantusincreased to 38 units, ADA diet, SSI, blood glucose monitoring -Appreciate recommendations from diabetes coordinator. -NovoLog insulin6units thrice daily as scheduled, hold if patient does not eat a meal or eats less than 50% of his meal  #Insomnia -Trazodone 100 mg nightly  #Social -patient has no income, health insurance, housing or support -will apply for SSDI and Medicaid on Monday with help  #Falls, truncal ataxia, hydrocephalus -PT consult is appreciated -Neurosurgery consult is appreciated -Head Ct scan revealed Severe obstructive hydrocephalus unchanged from 2013. Findings consistent with aqueductal stenosis.   #Disposition -TBE  I certify that the services received since the previous certification/recertification were and continue to be medically necessary as the treatment provided can be reasonably expected to improve the patient's condition; the medical record documents that the services furnished were intensive treatment services or their equivalent services, and this patient continues to need, on a daily basis, active treatment furnished directly by or requiring the supervision of inpatient psychiatric personnel.    Cindee LameLauren M Isbell, MD 09/22/2017, 1:51 PM

## 2017-09-22 NOTE — Progress Notes (Signed)
D:Patient remains to have unsteady gait  Walking  In halls .  Stated to Clinical research associatewriter he doesn't need  to use the wheelchair. A: Limited insight into his safety , continues to refuse assistance from staff . Patient remains on high fall. R: staff continue to monitor patient .

## 2017-09-22 NOTE — Progress Notes (Signed)
Patient denies SI at this time. This Clinical research associatewriter asked patient if he was just denying SI so that he wouldn't have to be observed any longer and the patient states "no, I just want to rest". This Clinical research associatewriter informed patient to notify staff of any changes and concerns that he may have. Patient agrees and contracts for safety at this time.

## 2017-09-22 NOTE — Progress Notes (Addendum)
D: Patient expresses to this writer SI with plan "to tear off something from the receptacles to use and cut myself with it". Patient states that he was in group discussing his options for placement and when someone stated that [place] may go [place] or be out on the streets "I just shut down". This Clinical research associatewriter asked him if he had anywhere to go and patient states that "my friends and family won't allow me to stay there". [facility] but doesn't know how he'll fulfill their work obligations.   A: Support and encouragement provided. Routine safety checks conducted every 15 minutes. Patient is visible on the unit in the day room. Patient informed to notify staff with problems or concerns.  R: Patient calm and cooperative. Patient remains safe on the unit at this time.

## 2017-09-23 LAB — GLUCOSE, CAPILLARY
GLUCOSE-CAPILLARY: 122 mg/dL — AB (ref 65–99)
Glucose-Capillary: 165 mg/dL — ABNORMAL HIGH (ref 65–99)
Glucose-Capillary: 272 mg/dL — ABNORMAL HIGH (ref 65–99)
Glucose-Capillary: 213 mg/dL — ABNORMAL HIGH (ref 65–99)

## 2017-09-23 NOTE — Progress Notes (Signed)
[facility] MD Progress Note  09/23/2017 10:26 PM GALVIN AVERSA  MRN:  [id number]  Subjective:    Mr. Godlewski continues to feel depressed and dispirited. He still has suicidal ideation. He applied for SSD today with help. The process will take several months even when successful. Medicaid eligibility is also reevaluated. The patient realized that his walet went missing during current admission as well. He is pretty upset about it.  Treatment plan. We will continue Haldol, Depakote, remron, Effexor and Neurontin for depression, psychosis and mood  stabilization.  Social/disposition. The patient has no resources or support. He uses a walker or a wheel chair now. Thisprecludes discharge to the homeless shelter.  Principal Problem: Severe recurrent major depression without psychotic features (HCC) Diagnosis:   Patient Active Problem List   Diagnosis Date Noted  . 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 Obstructive hydrocephalus [G91.1] 09/19/2017  . Diabetes (HCC) [E11.9] 09/11/2017  . Hypoglycemia [E16.2] 09/09/2017  . MDD (major depressive disorder) [F32.9] 08/22/2017  . Severe recurrent major depression without psychotic features (HCC) [F33.2] 08/19/2017  . Nausea & vomiting [R11.2] 08/18/2017  . Abdominal pain [R10.9] 08/18/2017  . Hyponatremia [E87.1] 08/18/2017  . DKA (diabetic ketoacidoses) (HCC) [E13.10] 11/24/2015  . Diabetic keto-acidosis (HCC) [E13.10] 10/23/2015   Total Time spent with patient: 30 minutes  Past Psychiatric History: bipolar disorder.  Past Medical History:  Past Medical History:  Diagnosis Date  . Diabetes mellitus without complication (HCC)   . Hydrocephalus   . Kidney stones     Past Surgical History:  Procedure Laterality Date  . KIDNEY STONE SURGERY     Family History:  Family History  Problem Relation Age of Onset  . Breast cancer Mother    Family Psychiatric  History:  None reported. Social History:  Social History   Substance and Sexual  Activity  Alcohol Use No   Comment: ocasional      Social History   Substance and Sexual Activity  Drug Use Yes  . Types: Marijuana   Comment: "as often as I can"    Social History   Socioeconomic History  . Marital status: Single    Spouse name: None  . Number of children: None  . Years of education: None  . Highest education level: None  Social Needs  . Financial resource strain: None  . Food insecurity - worry: None  . Food insecurity - inability: None  . Transportation needs - medical: None  . Transportation needs - non-medical: None  Occupational History  . None  Tobacco Use  . Smoking status: Current Every Day Smoker    Packs/day: 1.00    Types: Cigarettes  . Smokeless tobacco: Never Used  Substance and Sexual Activity  . Alcohol use: No    Comment: ocasional   . Drug use: Yes    Types: Marijuana    Comment: "as often as I can"  . Sexual activity: None  Other Topics Concern  . None  Social History Narrative  . None   Additional Social History:                         Sleep: Fair  Appetite:  Fair  Current Medications: Current Facility-Administered Medications  Medication Dose Route Frequency Provider Last Rate Last Dose  . acetaminophen (TYLENOL) tablet 650 mg  650 mg Oral Q6H PRN Clapacs, Jackquline Denmark, MD   650 mg at 09/17/17 2127  . alum & mag hydroxide-simeth (MAALOX/MYLANTA) 200-200-20 MG/5ML suspension 30  mL  30 mL Oral Q4H PRN Clapacs, [name] T, MD      . divalproex (DEPAKOTE) DR tablet 500 mg  500 mg Oral Q8H Solita Macadam B, MD   500 mg at 09/23/17 2148  . feeding supplement (GLUCERNA SHAKE) (GLUCERNA SHAKE) liquid 237 mL  237 mL Oral TID BM Alik Mawson B, MD   237 mL at 09/23/17 2148  . gabapentin (NEURONTIN) capsule 1,200 mg  1,200 mg Oral TID Creola Krotz B, MD   1,200 mg at 09/23/17 1713  . haloperidol (HALDOL) tablet 5 mg  5 mg Oral BID Qusay Villada B, MD   5 mg at 09/23/17 1713  . hydrOXYzine  (ATARAX/VISTARIL) tablet 25 mg  25 mg Oral Q4H PRN Aundria Rudakesh, Gopalkumar, MD   25 mg at 09/23/17 2152  . Influenza vac split quadrivalent PF (FLUARIX) injection 0.5 mL  0.5 mL Intramuscular Tomorrow-1000 Emmamarie Kluender B, MD      . insulin aspart (novoLOG) injection 0-5 Units  0-5 Units Subcutaneous QHS Merdith Boyd B, MD   4 Units at 09/22/17 2130  . insulin aspart (novoLOG) injection 0-9 Units  0-9 Units Subcutaneous TID WC Maryl Blalock B, MD   1 Units at 09/23/17 1714  . insulin aspart (novoLOG) injection 6 Units  6 Units Subcutaneous TID WC Coleson Kant B, MD   6 Units at 09/23/17 1714  . insulin glargine (LANTUS) injection 38 Units  38 Units Subcutaneous QHS Kristinia Leavy B, MD   38 Units at 09/23/17 2152  . magnesium hydroxide (MILK OF MAGNESIA) suspension 30 mL  30 mL Oral Daily PRN Clapacs, [name] T, MD      . mirtazapine (REMERON) tablet 30 mg  30 mg Oral QHS Clapacs, Jackquline DenmarkJohn T, MD   30 mg at 09/23/17 2148  . nicotine (NICODERM CQ - dosed in mg/24 hours) patch 21 mg  21 mg Transdermal Daily Kevontae Burgoon B, MD   21 mg at 09/23/17 0752  . polyethylene glycol (MIRALAX / GLYCOLAX) packet 17 g  17 g Oral Daily PRN Clapacs, [name] T, MD      . traZODone (DESYREL) tablet 200 mg  200 mg Oral QHS Ponce Skillman B, MD   200 mg at 09/23/17 2147  . venlafaxine XR (EFFEXOR-XR) 24 hr capsule 300 mg  300 mg Oral Q breakfast Neilani Duffee B, MD   300 mg at 09/23/17 0751    Lab Results:  Results for orders placed or performed during the hospital encounter of 09/10/17 (from the past 48 hour(s))  Glucose, capillary     Status: Abnormal   Collection Time: 09/22/17  7:11 AM  Result Value Ref Range   Glucose-Capillary 220 (H) 65 - 99 mg/dL   Comment 1 Notify RN   Glucose, capillary     Status: None   Collection Time: 09/22/17 11:23 AM  Result Value Ref Range   Glucose-Capillary 75 65 - 99 mg/dL  Glucose, capillary     Status: Abnormal   Collection Time: 09/22/17   4:24 PM  Result Value Ref Range   Glucose-Capillary 306 (H) 65 - 99 mg/dL   Comment 1 Notify RN   Glucose, capillary     Status: Abnormal   Collection Time: 09/22/17  9:25 PM  Result Value Ref Range   Glucose-Capillary 310 (H) 65 - 99 mg/dL  Glucose, capillary     Status: Abnormal   Collection Time: 09/23/17  6:58 AM  Result Value Ref Range   Glucose-Capillary 165 (H) 65 - 99 mg/dL  Comment 1 Notify RN   Glucose, capillary     Status: Abnormal   Collection Time: 09/23/17 11:30 AM  Result Value Ref Range   Glucose-Capillary 272 (H) 65 - 99 mg/dL  Glucose, capillary     Status: Abnormal   Collection Time: 09/23/17  4:25 PM  Result Value Ref Range   Glucose-Capillary 122 (H) 65 - 99 mg/dL  Glucose, capillary     Status: Abnormal   Collection Time: 09/23/17  9:07 PM  Result Value Ref Range   Glucose-Capillary 213 (H) 65 - 99 mg/dL    Blood Alcohol level:  Lab Results  Component Value Date   ETH <10 09/08/2017   ETH <10 08/18/2017    Metabolic Disorder Labs: Lab Results  Component Value Date   HGBA1C 8.7 (H) 09/11/2017   MPG 202.99 09/11/2017   No results found for: PROLACTIN Lab Results  Component Value Date   CHOL 217 (H) 09/11/2017   TRIG 135 09/11/2017   HDL 49 09/11/2017   CHOLHDL 4.4 09/11/2017   VLDL 27 09/11/2017   LDLCALC 141 (H) 09/11/2017   LDLCALC 216 (H) 08/23/2017    Physical Findings: AIMS: Facial and Oral Movements Muscles of Facial Expression: None, normal Lips and Perioral Area: None, normal Jaw: None, normal Tongue: None, normal,Extremity Movements Upper (arms, wrists, hands, fingers): None, normal Lower (legs, knees, ankles, toes): None, normal, Trunk Movements Neck, shoulders, hips: None, normal, Overall Severity Severity of abnormal movements (highest score from questions above): None, normal Incapacitation due to abnormal movements: None, normal Patient's awareness of abnormal movements (rate only patient's report): No Awareness,  Dental Status Current problems with teeth and/or dentures?: No Does patient usually wear dentures?: No  CIWA:    COWS:     Musculoskeletal: Strength & Muscle Tone: within normal limits Gait & Station: normal Patient leans: N/A  Psychiatric Specialty Exam: Physical Exam  Nursing note and vitals reviewed. Psychiatric: His speech is normal. He is withdrawn. Cognition and memory are normal. He expresses impulsivity. He exhibits a depressed mood. He expresses suicidal ideation.    Review of Systems  Musculoskeletal: Positive for falls.  Neurological: Positive for weakness and headaches.  Psychiatric/Behavioral: Positive for depression and suicidal ideas.  All other systems reviewed and are negative.   Blood pressure 106/60, pulse 63, temperature 97.7 F (36.5 C), temperature source Oral, resp. rate 18, height 6\' 2"  (1.88 m), weight 97.1 kg (214 lb), SpO2 98 %.Body mass index is 27.48 kg/m.  General Appearance: Fairly Groomed  Eye Contact:  Good  Speech:  Clear and Coherent  Volume:  Normal  Mood:  Depressed and Dysphoric  Affect:  Blunt  Thought Process:  Goal Directed and Descriptions of Associations: Intact  Orientation:  Full (Time, Place, and Person)  Thought Content:  WDL  Suicidal Thoughts:  Yes.  with intent/plan  Homicidal Thoughts:  No  Memory:  Immediate;   Fair Recent;   Fair Remote;   Fair  Judgement:  Poor  Insight:  Lacking  Psychomotor Activity:  Psychomotor Retardation  Concentration:  Concentration: Fair and Attention Span: Fair  Recall:  [employer]Fair  Fund of Knowledge:  Fair  Language:  Fair  Akathisia:  No  Handed:  Right  AIMS (if indicated):     Assets:  Communication Skills Desire for Improvement Resilience Social Support  ADL's:  Intact  Cognition:  WNL  Sleep:  Number of Hours: 7     Treatment Plan Summary: Daily contact with patient to assess and evaluate symptoms and  progress in treatment and Medication management   Mr. Millspaugh is a  31 year old male with a history ofMDD, severe with suicidal ideation, type 1 diabetes and treatment noncompliance transferred from ICU where he was hospitalized for hypoglycemia from presumed insulin overdose which the patient denies. He is still suicidal and depressed.  #Suicidal ideation -patientable to contract for safety in the hospital  #Mood -continue Haldol 5 mg BID -continue Depakote 500 mg TID, VPA level 93 -continue Effexor 300 mg daily -Continue Remeron 30 mg nightly -if discharged to the homeless shelter he may only take 2 psychotropic medications  #Diabetes, poorly controlled -Lantus38units, ADA diet, SSI, blood glucose monitoring -Appreciate recommendations from diabetes coordinator. -NovoLog insulin6units thrice daily as scheduled, hold if patient does not eat a meal or eats less than 50% of his meal  #Insomnia -Trazodone 100 mg nightly  #Social -patient has no income, health insurance, housing or support -will apply for SSDI and Medicaid on Monday with help  #Falls, truncal ataxia, hydrocephalus -PT consultis appreciated -Neurosurgery consult is appreciated -Head Ct scan revealedSevere obstructive hydrocephalus unchanged from 2013. Findings consistent with aqueductal stenosis.  #Disposition -TBE      Kristine Linea, MD 09/23/2017, 10:26 PM

## 2017-09-23 NOTE — Progress Notes (Signed)
Spoke with patient's RN. Expressed safety concerns to RN, Social Work and MD about taking patient off the unit to [place].

## 2017-09-23 NOTE — Progress Notes (Signed)
Inpatient Diabetes Program Recommendations  AACE/ADA: New Consensus Statement on Inpatient Glycemic Control (2015)  Target Ranges:  Prepandial:   less than 140 mg/dL      Peak postprandial:   less than 180 mg/dL (1-2 hours)      Critically ill patients:  140 - 180 mg/dL   Results for HANZEL, PIZZO (MRN [id number]) as of 09/23/2017 09:22  Ref. Range 09/22/2017 07:11 09/22/2017 11:23 09/22/2017 16:24 09/22/2017 21:25 09/23/2017 06:58  Glucose-Capillary Latest Ref Range: 65 - 99 mg/dL 220 (H)  Novolog 3 units 75 306 (H)  Novolog 13 units 310 (H)  Novolog 4 units 165 (H)  Novolog 3 units   Review of Glycemic Control  Outpatient Diabetes medications: Lantus 38 units QHS, Novolog 15 units TID with meals Current orders for Inpatient glycemic control: Lantus 38 units QHS, Novolog 0-9 units TID with meals, Novolog 0-5 units QHS, Novolog 6 units TID with meals for meal coverage  Inpatient Diabetes Program Recommendations:  Insulin - Meal Coverage: NURSING: Please encourage patient to eat all meals and administer meal coverage insulin as ordered when parameters are met.  Parameters for meal coverage insulin are:  Add to correction insulin in the same syringe. ** Assumes patient eating 50 % or more of meal ** * Do NOT give if CBG < 80 or if patient is on premixed insulin. * Do NOT give if meal intake is less than 50 %. * Give correction and meal coverage insulin with or immediately after the meal.  NOTE: In reviewing chart, noted patient did not eat breakfast on 09/22/17 so no meal coverage needed for breakfast. Patient at 100% of lunch but meal coverage was not given (pre-meal glucose 75 mg/dl). As a result of eating 100% and no insulin coverage to cover, glucose up to 306 mg/dl prior to supper. Patient did receive meal coverage for supper on 09/22/17 and CBG up to 310 mg/dl at bedtime. Modified meal coverage order to add in parameters. No changes recommended with insulin orders at this  time.  Thanks, Barnie Alderman, RN, MSN, CDE Diabetes Coordinator Inpatient Diabetes Program [phone] (Team Pager from 8am to 5pm)

## 2017-09-23 NOTE — [facility] Group Notes (Signed)
09/23/2017 0930   Type of Therapy and Topic:  Group Therapy:  Overcoming Obstacles   Participation Level:  Minimal   Description of Group:   In this group patients will be encouraged to explore what they see as obstacles to their own wellness and recovery. They will be guided to discuss their thoughts, feelings, and behaviors related to these obstacles. The group will process together ways to cope with barriers, with attention given to specific choices patients can make. Each patient will be challenged to identify changes they are motivated to make in order to overcome their obstacles. This group will be process-oriented, with patients participating in exploration of their own experiences, giving and receiving support, and processing challenge from other group members.   Therapeutic Goals: 1. Patient will identify personal and current obstacles as they relate to admission. 2. Patient will identify barriers that currently interfere with their wellness or overcoming obstacles.  3. Patient will identify feelings, thought process and behaviors related to these barriers. 4. Patient will identify two changes they are willing to make to overcome these obstacles:      Summary of Patient Progress  Pt attended group for the first ten minutes. During this time, pt reported that he was feeling "kind of encouraged because I have a meeting to get my disability stuff started today." Pt was also able to identify the obstacle that brought him into the hospital was, "my own head and expressing how I feel." However, when another group member validated pt, pt became agitated and stated, "Okay. I'm done." Pt then left group room. Pt continues to work towards his tx goals but has not yet reached them.    Therapeutic Modalities:   Cognitive Behavioral Therapy Solution Focused Therapy Motivational Interviewing Relapse Prevention Therapy  Heidi DachKelsey Kadedra Vanaken, MSW, LCSW 09/23/2017 10:16 AM

## 2017-09-23 NOTE — Tx Team (Signed)
Interdisciplinary Treatment and Diagnostic Plan Update  09/23/2017 Time of Session: 1030 Mark [name] MRN: [id number]  Principal Diagnosis: Severe recurrent major depression without psychotic features [facility])  Secondary Diagnoses: Principal Problem:   Severe recurrent major depression without psychotic features ([place]) Active Problems:   Diabetes ([place])   Obstructive hydrocephalus   Truncal ataxia   Current Medications:  Current Facility-Administered Medications  Medication Dose Route Frequency Provider Last Rate Last Dose  . acetaminophen (TYLENOL) tablet 650 mg  650 mg Oral Q6H PRN Clapacs, Madie Reno, MD   650 mg at 09/17/17 2127  . alum & mag hydroxide-simeth (MAALOX/MYLANTA) 200-200-20 MG/5ML suspension 30 mL  30 mL Oral Q4H PRN Clapacs, [name] T, MD      . divalproex (DEPAKOTE) DR tablet 500 mg  500 mg Oral Q8H Pucilowska, Jolanta B, MD   500 mg at 09/23/17 0450  . feeding supplement (GLUCERNA SHAKE) (GLUCERNA SHAKE) liquid 237 mL  237 mL Oral TID BM Pucilowska, Jolanta B, MD   237 mL at 09/20/17 2019  . gabapentin (NEURONTIN) capsule 1,200 mg  1,200 mg Oral TID Pucilowska, Jolanta B, MD   1,200 mg at 09/23/17 0751  . haloperidol (HALDOL) tablet 5 mg  5 mg Oral BID Pucilowska, Jolanta B, MD   5 mg at 09/23/17 0752  . hydrOXYzine (ATARAX/VISTARIL) tablet 25 mg  25 mg Oral Q4H PRN Ramond Dial, MD   25 mg at 09/22/17 2132  . Influenza vac split quadrivalent PF (FLUARIX) injection 0.5 mL  0.5 mL Intramuscular Tomorrow-1000 Pucilowska, Jolanta B, MD      . insulin aspart (novoLOG) injection 0-5 Units  0-5 Units Subcutaneous QHS Pucilowska, Jolanta B, MD   4 Units at 09/22/17 2130  . insulin aspart (novoLOG) injection 0-9 Units  0-9 Units Subcutaneous TID WC Pucilowska, Jolanta B, MD   2 Units at 09/23/17 0753  . insulin aspart (novoLOG) injection 6 Units  6 Units Subcutaneous TID WC Pucilowska, Jolanta B, MD   6 Units at 09/22/17 1706  . insulin glargine (LANTUS) injection 38 Units  38  Units Subcutaneous QHS Pucilowska, Jolanta B, MD   38 Units at 09/22/17 2131  . magnesium hydroxide (MILK OF MAGNESIA) suspension 30 mL  30 mL Oral Daily PRN Clapacs, [name] T, MD      . mirtazapine (REMERON) tablet 30 mg  30 mg Oral QHS Clapacs, Madie Reno, MD   30 mg at 09/22/17 2132  . nicotine (NICODERM CQ - dosed in mg/24 hours) patch 21 mg  21 mg Transdermal Daily Pucilowska, Jolanta B, MD   21 mg at 09/23/17 0752  . polyethylene glycol (MIRALAX / GLYCOLAX) packet 17 g  17 g Oral Daily PRN Clapacs, [name] T, MD      . traZODone (DESYREL) tablet 200 mg  200 mg Oral QHS Pucilowska, Jolanta B, MD   200 mg at 09/22/17 2132  . venlafaxine XR (EFFEXOR-XR) 24 hr capsule 300 mg  300 mg Oral Q breakfast Pucilowska, Jolanta B, MD   300 mg at 09/23/17 0751   PTA Medications: Medications Prior to Admission  Medication Sig Dispense Refill Last Dose  . insulin aspart (NOVOLOG) 100 UNIT/ML injection Inject 0-9 Units 3 (three) times daily with meals into the skin. Sliding Scale CBG 70 - 120: 0 units  CBG 121 - 150: 1 unit  CBG 151 - 200: 2 units  CBG 201 - 250: 3 units  CBG 251 - 300: 5 units  CBG 301 - 350: 7 units  CBG 351 -  400 9 units 10 mL 0 PRN at PRN  . insulin aspart (NOVOLOG) 100 UNIT/ML injection Inject 0-5 Units at bedtime into the skin. Bedtime Sliding Scale  CBG 70 - 120: 0 units  CBG 121 - 150: 0 units  CBG 151 - 200: 0 units  CBG 201 - 250: 2 units  CBG 251 - 300: 3 units  CBG 301 - 350: 4 units  CBG 351 - 400: 5 units (Patient not taking: Reported on 09/09/2017) 10 mL 0 Not Taking at Unknown time  . insulin aspart (NOVOLOG) 100 UNIT/ML injection Inject 15 Units 3 (three) times daily with meals into the skin. Do not take if you eat less than 50% of you meal (Patient not taking: Reported on 09/09/2017) 13.5 mL 0 Not Taking at Unknown time  . insulin glargine (LANTUS) 100 UNIT/ML injection Inject 0.38 mLs (38 Units total) into the skin every morning. 11.4 mL 0   . mirtazapine (REMERON) 30 MG  tablet Take 1 tablet (30 mg total) at bedtime by mouth. 30 tablet 2 09/08/2017 at 2000  . venlafaxine XR (EFFEXOR-XR) 150 MG 24 hr capsule Take 1 capsule (150 mg total) daily with breakfast by mouth. 30 capsule 2 09/08/2017 at 0800  . zolpidem (AMBIEN) 10 MG tablet Take 1 tablet (10 mg total) at bedtime by mouth. (Patient not taking: Reported on 09/09/2017) 30 tablet 1 Completed Course at Unknown time    Patient Stressors: Health problems Loss of Mother Medication change or noncompliance Substance abuse  Patient Strengths: Average or above average intelligence Capable of independent living  Treatment Modalities: Medication Management, Group therapy, Case management,  1 to 1 session with clinician, Psychoeducation, Recreational therapy.   Physician Treatment Plan for Primary Diagnosis: Severe recurrent major depression without psychotic features ([place]) Long Term Goal(s): Improvement in symptoms so as ready for discharge Improvement in symptoms so as ready for discharge   Short Term Goals: Ability to identify changes in lifestyle to reduce recurrence of condition will improve Ability to verbalize feelings will improve Ability to disclose and discuss suicidal ideas Ability to demonstrate self-control will improve Ability to identify and develop effective coping behaviors will improve Ability to maintain clinical measurements within normal limits will improve Compliance with prescribed medications will improve Ability to identify triggers associated with substance abuse/mental health issues will improve NA  Medication Management: Evaluate patient's response, side effects, and tolerance of medication regimen.  Therapeutic Interventions: 1 to 1 sessions, Unit Group sessions and Medication administration.  Evaluation of Outcomes: Not Met  Physician Treatment Plan for Secondary Diagnosis: Principal Problem:   Severe recurrent major depression without psychotic features ([place]) Active  Problems:   Diabetes ([place])   Obstructive hydrocephalus   Truncal ataxia  Long Term Goal(s): Improvement in symptoms so as ready for discharge Improvement in symptoms so as ready for discharge   Short Term Goals: Ability to identify changes in lifestyle to reduce recurrence of condition will improve Ability to verbalize feelings will improve Ability to disclose and discuss suicidal ideas Ability to demonstrate self-control will improve Ability to identify and develop effective coping behaviors will improve Ability to maintain clinical measurements within normal limits will improve Compliance with prescribed medications will improve Ability to identify triggers associated with substance abuse/mental health issues will improve NA     Medication Management: Evaluate patient's response, side effects, and tolerance of medication regimen.  Therapeutic Interventions: 1 to 1 sessions, Unit Group sessions and Medication administration.  Evaluation of Outcomes: Not Met   RN  Treatment Plan for Primary Diagnosis: Severe recurrent major depression without psychotic features ([place]) Long Term Goal(s): Knowledge of disease and therapeutic regimen to maintain health will improve  Short Term Goals: Ability to verbalize feelings will improve, Ability to identify and develop effective coping behaviors will improve and Compliance with prescribed medications will improve  Medication Management: RN will administer medications as ordered by provider, will assess and evaluate patient's response and provide education to patient for prescribed medication. RN will report any adverse and/or side effects to prescribing provider.  Therapeutic Interventions: 1 on 1 counseling sessions, Psychoeducation, Medication administration, Evaluate responses to treatment, Monitor vital signs and CBGs as ordered, Perform/monitor CIWA, COWS, AIMS and Fall Risk screenings as ordered, Perform wound care treatments as  ordered.  Evaluation of Outcomes: Not Met   LCSW Treatment Plan for Primary Diagnosis: Severe recurrent major depression without psychotic features ([place]) Long Term Goal(s): Safe transition to appropriate next level of care at discharge, Engage patient in therapeutic group addressing interpersonal concerns.  Short Term Goals: Engage patient in aftercare planning with referrals and resources, Increase social support and Increase skills for wellness and recovery  Therapeutic Interventions: Assess for all discharge needs, 1 to 1 time with Social worker, Explore available resources and support systems, Assess for adequacy in community support network, Educate family and significant other(s) on suicide prevention, Complete Psychosocial Assessment, Interpersonal group therapy.  Evaluation of Outcomes: Not Met   Progress in Treatment: Attending groups: Yes. Mark [name] attends groups at times.  Participating in groups: Not regularly, but Mark [name] will participate when prompted by CSW.  Taking medication as prescribed: Yes. Toleration medication: Yes. Family/Significant other contact made: No, will contact:  Mark [name] refused consent Patient understands diagnosis: Yes. Discussing patient identified problems/goals with staff: No. Medical problems stabilized or resolved: Yes. Denies suicidal/homicidal ideation: Yes. Issues/concerns per patient self-inventory: No. Other: none  New problem(s) identified: No, Describe:  none  New Short Term/Long Term Goal(s): Mark [name] reported that his goal is to "have a plan and have somewhere to live."   Discharge Plan or Barriers: Housing remains a barrier to discharge. Medication management has also presented a problem for Mark [name]'s safety outside of the hospital. Mark [name] continues to work towards his treatment plans.   Reason for Continuation of Hospitalization: Depression Medication stabilization  Estimated Length of Stay: 1-2 days  Recreational Therapy: Patient Stressors: Family,  Relationship, Death, Friends, Work, School Patient Goal: Patient will identify 3 benefits of self-care x7 days.   Attendees: Patient:  09/23/2017 11:16 AM  Physician: Dr. Bary Leriche, MD 09/23/2017 11:16 AM  Nursing: Ledell Noss, RN 09/23/2017 11:16 AM  RN Care Manager: 09/23/2017 11:16 AM  Social Worker:  Alden Hipp, LCSW 09/23/2017 11:16 AM  Recreational Therapist: Roanna Epley, CTRS-LRT 09/23/2017 11:16 AM  Other: Darin Engels, [place] 09/23/2017 11:16 AM  Other:  09/23/2017 11:16 AM  Other: 09/23/2017 11:16 AM      Scribe for Treatment Team: Alden Hipp, LCSW 09/23/2017 11:26 AM

## 2017-09-23 NOTE — Progress Notes (Signed)
CSW and Cecille Rubin from [facility] met with pt and completed disability paperwork.  Process takes 4-6 months, per Cecille Rubin.  Pt provided his sister as contact person after he leaves the hospital.  CSW also spoke to Lizbeth Bark from financial counseling regarding medicaid application.  She will reevaluate his situation in light of the neurology consult from last week and see if medicaid eligibility looks possible. Winferd Humphrey, MSW, LCSW Clinical Social Worker 09/23/2017 10:50 AM

## 2017-09-23 NOTE — Plan of Care (Signed)
Patient up on the unit after breakfast. He did refuse breakfast. He denies si, currently. But expresses it often with no plan. He is med compliant. No voices or hallucinations. Will continue to monitor.

## 2017-09-23 NOTE — Progress Notes (Signed)
Physical Therapy Treatment Patient Details Name: Mark [name] MRN: [id number] DOB: 12/12/1985 Today's Date: 09/23/2017    History of Present Illness [name] is a 31 year old male with a history ofMDD, severe with suicidal ideation, type 1 diabetes and treatment noncompliance transferred from ICU where he was hospitalized for severe hypoglycemia from presumed insulin overdose.CT head revealed severe obstructive hydrocephalus unchanged since 2013.  Neuro consult pending.     PT Comments    Pt presents with deficits in transfers, gait, and balance, but is progressing towards goals.  Pt continues to present with poor eccentric control during stand to sit but improved with practice and after anterior weight shifting activities.  Pt ambulates initially without RW demonstrating min anterior trunk lean and shuffling steps with ataxic gait, requiring close SBA/CGA.  Notable improvement with RW with pt admitting has been reluctant to use the RW secondary to stigma but reports has reconsidered that secondary to instability with gait and will use it short term.  Pt will benefit from PT services in a SNF setting upon discharge to safely address above deficits for decreased caregiver assistance and eventual return to PLOF.       Follow Up Recommendations  SNF;Supervision for mobility/OOB     Equipment Recommendations  Rolling walker with 5" wheels    Recommendations for Other Services       Precautions / Restrictions Precautions Precautions: Fall Restrictions Weight Bearing Restrictions: No    Mobility  Bed Mobility Overal bed mobility: Independent                Transfers Overall transfer level: Needs assistance Equipment used: None;Rolling walker (2 wheeled) Transfers: Sit to/from Stand Sit to Stand: Supervision         General transfer comment: Pt continues to present with poor eccentric control during stand to sit but improved with practice and after anterior weight  shifting activities  Ambulation/Gait Ambulation/Gait assistance: Supervision Ambulation Distance (Feet): 125 Feet Assistive device: None;Rolling walker (2 wheeled) Gait Pattern/deviations: Step-through pattern;Decreased step length - right;Decreased step length - left;Ataxic;Staggering right;Staggering left   Gait velocity interpretation: <1.8 ft/sec, indicative of risk for recurrent falls General Gait Details: Pt ambulates initially without RW demonstrating anterior trunk lean and shuffling steps with ataxic gait, requiring close SBA/CGA.  Notable improvement with RW with pt admitting has been reluctant to use the RW secondary to stigma but reports has reconsidered that secondary to instability with gait and will use it short term.       Stairs            Wheelchair Mobility    Modified Rankin (Stroke Patients Only)       Balance Overall balance assessment: Needs assistance Sitting-balance support: Feet supported;No upper extremity supported Sitting balance-Leahy Scale: Good     Standing balance support: No upper extremity supported Standing balance-Leahy Scale: Poor Standing balance comment: Posterior instability in standing during static standing balance training but did improve somewhat after standing anterior weight shifting activities.                            Cognition Arousal/Alertness: Awake/alert Behavior During Therapy: WFL for tasks assessed/performed Overall Cognitive Status: Within Functional Limits for tasks assessed                                        Exercises Other Exercises Other  Exercises: Seated and standing anterior weight shifting activities to facilitate improved eccentric control during sitting and improved standing balance. Other Exercises: Static standing balance training with feet apart, together, and semi-tandem with eyes open/closed and head still/head turns with poor stability progressing to fair during  session.      General Comments        Pertinent Vitals/Pain Pain Assessment: 0-10 Pain Score: 6  Pain Location: BLEs, chronic neuropathy pain per pt Pain Descriptors / Indicators: Aching Pain Intervention(s): Premedicated before session;Monitored during session;Limited activity within patient's tolerance    Home Living                      Prior Function            PT Goals (current goals can now be found in the care plan section) Progress towards PT goals: Progressing toward goals    Frequency    Min 2X/week      PT Plan Current plan remains appropriate    Co-evaluation              AM-PAC PT "6 Clicks" Daily Activity  Outcome Measure                   End of Session Equipment Utilized During Treatment: Gait belt Activity Tolerance: Patient tolerated treatment well Patient left: in bed Nurse Communication: Mobility status PT Visit Diagnosis: Unsteadiness on feet (R26.81);Repeated falls (R29.6);Other abnormalities of gait and mobility (R26.89);History of falling (Z91.81);Difficulty in walking, not elsewhere classified (R26.2)     Time: 1135-1200 PT Time Calculation (min) (ACUTE ONLY): 25 min  Charges:  $Therapeutic Exercise: 8-22 mins $Therapeutic Activity: 8-22 mins                    G Codes:       Elly Modena. Scott Sencere Symonette PT, DPT 09/23/17, 12:22 PM

## 2017-09-23 NOTE — Progress Notes (Signed)
Recreation Therapy Notes   Date: 12.03.2018  Time: 1:00pm  Location: Craft Room  Behavioral response: Appropriate  Intervention Topic: Emotions   Discussion/Intervention:    Clinical Observations/Feedback:    Jamin Humphries LRT/CTRS         Jayveion Stalling 09/23/2017 1:47 PM

## 2017-09-23 NOTE — [facility] Group Notes (Signed)
[facility] Group Notes:  (Nursing/MHT/Case Management/Adjunct)  Date:  09/23/2017  Time:  2:54 PM  Type of Therapy:  Psychoeducational Skills  Participation Level:  Minimal    Mark Farberamela M Moses [name] 09/23/2017, 2:54 PM

## 2017-09-24 LAB — GLUCOSE, CAPILLARY
Glucose-Capillary: 263 mg/dL — ABNORMAL HIGH (ref 65–99)
Glucose-Capillary: 107 mg/dL — ABNORMAL HIGH (ref 65–99)
Glucose-Capillary: 180 mg/dL — ABNORMAL HIGH (ref 65–99)
Glucose-Capillary: 230 mg/dL — ABNORMAL HIGH (ref 65–99)

## 2017-09-24 NOTE — Progress Notes (Signed)
Patient is well rested without any disturbances last night respiration is normal, checked every 15 minutes for safety. Denies any suicidal thoughts, affect is bright, patient endorses occasional  Visual hallucinations no bizarre behaviors, thought content is appropriate, socialize with peers and participate in groups and assertive no distress noted at this time.

## 2017-09-24 NOTE — Progress Notes (Addendum)
Per Elita Booneachel Wade, financial counseling: Mr Mark [name] still does not have the medical criteria to be deemed disabled.  [facility] an application for SSI income.  However, I can complete an application for (basic) Medicaid in which [name] is only eligible for [facility].  It will provide him a basic physical and behavioral health benefits through [facility].  Later: Correction: [employer] is only for reipients who are eligible for for full Medicaid coverage so I will not be submitting an application. I was hopping he would receive [employer] but it is not a benefit he is eligible for at this time.  Maybe Aurther Lofterry can be of assistance. Garner NashGregory Ryllie Nieland, MSW, LCSW Clinical Social Worker 09/24/2017 9:55 AM    CSW spoke with Willy EddyShuvon Thompson of [facility] and updated her.  She will speak with her supervisor about any options. Garner NashGregory Chyane Greer, MSW, LCSW Clinical Social Worker 09/24/2017 12:38 PM   CSW spoke with Wandra MannanZack Brooks, Asst Director about pt not being eligible for medicaid and the update on the SSI application.  He is going to contact Elita BooneRachel Wade in financial services to clarify the situation./ Garner NashGregory Tyriana Helmkamp, MSW, LCSW Clinical Social Worker 09/24/2017 2:13 PM

## 2017-09-24 NOTE — Plan of Care (Signed)
Patient verbalized that he is having suicidal thoughts all the time.Contracts for safety.No irritable behaviors noted.

## 2017-09-24 NOTE — Progress Notes (Signed)
Patient verbalized that he is having suicidal thoughts because he don't think his life is going to be any better.No irritable behaviors noted today.Patient is receptive and appropriate with staff and peers.Gait is unsteady.Uses walker at times.Compliant with medications.Appetite and energy level good.Attended groups.Support and encouragement given.

## 2017-09-24 NOTE — [facility] Group Notes (Signed)
09/24/2017 9:30am  Type of Therapy/Topic:  Group Therapy:  Feelings about Diagnosis  Participation Level:  Did Not Attend   Description of Group:   This group will allow patients to explore their thoughts and feelings about diagnoses they have received. Patients will be guided to explore their level of understanding and acceptance of these diagnoses. Facilitator will encourage patients to process their thoughts and feelings about the reactions of others to their diagnosis and will guide patients in identifying ways to discuss their diagnosis with significant others in their lives. This group will be process-oriented, with patients participating in exploration of their own experiences, giving and receiving support, and processing challenge from other group members.   Therapeutic Goals: 1. Patient will demonstrate understanding of diagnosis as evidenced by identifying two or more symptoms of the disorder 2. Patient will be able to express two feelings regarding the diagnosis 3. Patient will demonstrate their ability to communicate their needs through discussion and/or role play  Summary of Patient Progress: Did not attend      Therapeutic Modalities:   Cognitive Behavioral Therapy Brief Therapy Feelings Identification    Johny ShearsCassandra  Maxamillian Tienda, LCSW 09/24/2017 10:15 AM

## 2017-09-24 NOTE — Progress Notes (Addendum)
Inpatient Diabetes Program Recommendations  AACE/ADA: New Consensus Statement on Inpatient Glycemic Control (2015)  Target Ranges:  Prepandial:   less than 140 mg/dL      Peak postprandial:   less than 180 mg/dL (1-2 hours)      Critically ill patients:  140 - 180 mg/dL   Results for Mark [name], Mark [name] (MRN [id number]) as of 09/24/2017 08:17  Ref. Range 09/23/2017 06:58 09/23/2017 11:30 09/23/2017 16:25 09/23/2017 21:07 09/24/2017 06:58  Glucose-Capillary Latest Ref Range: 65 - 99 mg/dL 086165 (H)  Novolog 2 units 272 (H)  Novolog 11 units 122 (H)  Novolog 7 units 213 (H)  Lantus 38 units 263 (H)  Novolog 5 units @ 6:44 am  Novolog 11 units @ 7:54 (meal cov and SSI)   Review of Glycemic Control  Outpatient Diabetes medications: Lantus 38 units QHS, Novolog 15 units TID with meals Current orders for Inpatient glycemic control: Lantus 38 units QHS, Novolog 6 units TID with meals, Novolog 0-9 units TID with meals, Novolog 0-5 units QHS  Inpatient Diabetes Program Recommendations: Insulin - Basal: Please consider increasing Lantus to 40 units QHS. Insulin - Meal Coverage: Please consider increasing meal coverage to Novolog 8 units TID with meals if patient eats at least 50% of meals. If meal coverage is increased as recommended, please start with supper today.  NOTE: In reviewing the chart, noted glucose at bedtime on 12/3 was 213 mg/dl but no Novolog correction was given as ordered at that time (based on scale, patient should have received Novolog 2 units). Noted Novolog 5 units was given by night shift at 6:44 am and charted under Novolog bedtime correction on MAR. According to the chart, patient has  received Novolog 11 units this morning at 7:54 am (5 units for correction and 6 units for meal coverage). Patient is at risk for hypoglycemia this morning as a result of getting a total of Novolog 16 units over a hour time frame. Called unit and spoke with Gigi, RN regarding patient receiving a  total of Novolog 16 units this morning so close together. Asked to be alert to potential for patient to experience hypoglycemia this morning.   Thanks, Orlando PennerMarie Katesha Eichel, RN, MSN, CDE Diabetes Coordinator Inpatient Diabetes Program [phone] (Team Pager from 8am to 5pm)

## 2017-09-24 NOTE — Plan of Care (Signed)
Patient able to self report feelings. Patient oriented to unit. Progressing Activity: Sleeping patterns will improve 09/24/2017 2315 - Progressing by Addison Naegelieynolds, Makenze Ellett I, RN Self-Concept: Ability to verbalize positive feelings about self will improve 09/24/2017 2315 - Progressing by Berkley Harveyeynolds, Vanna Sailer I, RN Education: Knowledge of [facility] Education information/materials will improve 09/24/2017 2315 - Progressing by Berkley Harveyeynolds, Aedon Deason I, RN Emotional status will improve 09/24/2017 2315 - Progressing by Berkley Harveyeynolds, Leylany Nored I, RN

## 2017-09-24 NOTE — [facility] Group Notes (Signed)
Goals Group Date/Time: 09/24/2017 9:00 AM Type of Therapy and Topic: Group Therapy: Goals Group: SMART Goals   Participation Level: Moderate  Description of Group:    The purpose of a daily goals group is to assist and guide patients in setting recovery/wellness-related goals. The objective is to set goals as they relate to the crisis in which they were admitted. Patients will be using SMART goal modalities to set measurable goals. Characteristics of realistic goals will be discussed and patients will be assisted in setting and processing how one will reach their goal. Facilitator will also assist patients in applying interventions and coping skills learned in psycho-education groups to the SMART goal and process how one will achieve defined goal.   Therapeutic Goals:   -Patients will develop and document one goal related to or their crisis in which brought them into treatment.  -Patients will be guided by LCSW using SMART goal setting modality in how to set a measurable, attainable, realistic and time sensitive goal.  -Patients will process barriers in reaching goal.  -Patients will process interventions in how to overcome and successful in reaching goal.   Patient's Goal: to relax and not let people get to me.   Therapeutic Modalities:  Motivational Interviewing  Research officer, political partyCognitive Behavioral Therapy  Crisis Intervention Model  SMART goals setting   Mark SquibbGreg Rodert [name], KentuckyLCSW

## 2017-09-24 NOTE — Progress Notes (Signed)
[facility]  09/24/2017 11:31 AM Mark [name]  MRN:  [id number]  Subjective:   [name] is utterly hopeless and dispirited today. According to our financial services, he does not qualify for Medicaid and they refused to help him with the application. His walet still missing. He is suicidal.  Treatment plan. We weill continue Haldol, Depakote, Effexor and Remeron for depression and mood stabilization.  Social/disposition. SSDI application was completed yesterday. There is still no safe discharge plan.  Principal Problem: Severe recurrent major depression without psychotic features (HCC) Diagnosis:   Patient Active Problem List   Diagnosis Date Noted  . 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 Obstructive hydrocephalus [G91.1] 09/19/2017  . Diabetes (HCC) [E11.9] 09/11/2017  . Hypoglycemia [E16.2] 09/09/2017  . MDD (major depressive disorder) [F32.9] 08/22/2017  . Severe recurrent major depression without psychotic features (HCC) [F33.2] 08/19/2017  . Nausea & vomiting [R11.2] 08/18/2017  . Abdominal pain [R10.9] 08/18/2017  . Hyponatremia [E87.1] 08/18/2017  . DKA (diabetic ketoacidoses) (HCC) [E13.10] 11/24/2015  . Diabetic keto-acidosis (HCC) [E13.10] 10/23/2015   Total Time spent with patient: 20 minutes  Past Psychiatric History: bipolar disorder  Past Medical History:  Past Medical History:  Diagnosis Date  . Diabetes mellitus without complication (HCC)   . Hydrocephalus   . Kidney stones     Past Surgical History:  Procedure Laterality Date  . KIDNEY STONE SURGERY     Family History:  Family History  Problem Relation Age of Onset  . Breast cancer Mother    Family Psychiatric  History: none reported Social History:  Social History   Substance and Sexual Activity  Alcohol Use No   Comment: ocasional      Social History   Substance and Sexual Activity  Drug Use Yes  . Types: Marijuana   Comment: "as often as I can"    Social History    Socioeconomic History  . Marital status: Single    Spouse name: None  . Number of children: None  . Years of education: None  . Highest education level: None  Social Needs  . Financial resource strain: None  . Food insecurity - worry: None  . Food insecurity - inability: None  . Transportation needs - medical: None  . Transportation needs - non-medical: None  Occupational History  . None  Tobacco Use  . Smoking status: Current Every Day Smoker    Packs/day: 1.00    Types: Cigarettes  . Smokeless tobacco: Never Used  Substance and Sexual Activity  . Alcohol use: No    Comment: ocasional   . Drug use: Yes    Types: Marijuana    Comment: "as often as I can"  . Sexual activity: None  Other Topics Concern  . None  Social History Narrative  . None   Additional Social History:                         Sleep: Fair  Appetite:  Fair  Current Medications: Current Facility-Administered Medications  Medication Dose Route Frequency Provider Last Rate Last Dose  . acetaminophen (TYLENOL) tablet 650 mg  650 mg Oral Q6H PRN Clapacs, Jackquline DenmarkJohn T, MD   650 mg at 09/17/17 2127  . alum & mag hydroxide-simeth (MAALOX/MYLANTA) 200-200-20 MG/5ML suspension 30 mL  30 mL Oral Q4H PRN Clapacs, [name] T, MD      . divalproex (DEPAKOTE) DR tablet 500 mg  500 mg Oral Q8H Ayala Ribble B, MD  500 mg at 09/24/17 0650  . feeding supplement (GLUCERNA SHAKE) (GLUCERNA SHAKE) liquid 237 mL  237 mL Oral TID BM Kerron Sedano B, MD   237 mL at 09/24/17 1113  . gabapentin (NEURONTIN) capsule 1,200 mg  1,200 mg Oral TID Elvina Bosch B, MD   1,200 mg at 09/24/17 0755  . haloperidol (HALDOL) tablet 5 mg  5 mg Oral BID Tayjah Lobdell B, MD   5 mg at 09/24/17 0755  . hydrOXYzine (ATARAX/VISTARIL) tablet 25 mg  25 mg Oral Q4H PRN Aundria Rud, MD   25 mg at 09/23/17 2152  . Influenza vac split quadrivalent PF (FLUARIX) injection 0.5 mL  0.5 mL Intramuscular Tomorrow-1000  Beatrice Sehgal B, MD      . insulin aspart (novoLOG) injection 0-5 Units  0-5 Units Subcutaneous QHS Odette Watanabe B, MD   5 Units at 09/24/17 0644  . insulin aspart (novoLOG) injection 0-9 Units  0-9 Units Subcutaneous TID WC Vera Wishart B, MD   5 Units at 09/24/17 0754  . insulin aspart (novoLOG) injection 6 Units  6 Units Subcutaneous TID WC Jorey Dollard B, MD   6 Units at 09/24/17 0754  . insulin glargine (LANTUS) injection 38 Units  38 Units Subcutaneous QHS Jovita Persing B, MD   38 Units at 09/23/17 2152  . magnesium hydroxide (MILK OF MAGNESIA) suspension 30 mL  30 mL Oral Daily PRN Clapacs, [name] T, MD      . mirtazapine (REMERON) tablet 30 mg  30 mg Oral QHS Clapacs, Jackquline Denmark, MD   30 mg at 09/23/17 2148  . nicotine (NICODERM CQ - dosed in mg/24 hours) patch 21 mg  21 mg Transdermal Daily Abygayle Deltoro B, MD   21 mg at 09/23/17 0752  . polyethylene glycol (MIRALAX / GLYCOLAX) packet 17 g  17 g Oral Daily PRN Clapacs, [name] T, MD      . traZODone (DESYREL) tablet 200 mg  200 mg Oral QHS Janeka Libman B, MD   200 mg at 09/23/17 2147  . venlafaxine XR (EFFEXOR-XR) 24 hr capsule 300 mg  300 mg Oral Q breakfast Iniko Robles B, MD   300 mg at 09/24/17 0754    Lab Results:  Results for orders placed or performed during the hospital encounter of 09/10/17 (from the past 48 hour(s))  Glucose, capillary     Status: Abnormal   Collection Time: 09/22/17  4:24 PM  Result Value Ref Range   Glucose-Capillary 306 (H) 65 - 99 mg/dL   Comment 1 Notify RN   Glucose, capillary     Status: Abnormal   Collection Time: 09/22/17  9:25 PM  Result Value Ref Range   Glucose-Capillary 310 (H) 65 - 99 mg/dL  Glucose, capillary     Status: Abnormal   Collection Time: 09/23/17  6:58 AM  Result Value Ref Range   Glucose-Capillary 165 (H) 65 - 99 mg/dL   Comment 1 Notify RN   Glucose, capillary     Status: Abnormal   Collection Time: 09/23/17 11:30 AM  Result  Value Ref Range   Glucose-Capillary 272 (H) 65 - 99 mg/dL  Glucose, capillary     Status: Abnormal   Collection Time: 09/23/17  4:25 PM  Result Value Ref Range   Glucose-Capillary 122 (H) 65 - 99 mg/dL  Glucose, capillary     Status: Abnormal   Collection Time: 09/23/17  9:07 PM  Result Value Ref Range   Glucose-Capillary 213 (H) 65 - 99 mg/dL  Glucose,  capillary     Status: Abnormal   Collection Time: 09/24/17  6:58 AM  Result Value Ref Range   Glucose-Capillary 263 (H) 65 - 99 mg/dL    Blood Alcohol level:  Lab Results  Component Value Date   ETH <10 09/08/2017   ETH <10 08/18/2017    Metabolic Disorder Labs: Lab Results  Component Value Date   HGBA1C 8.7 (H) 09/11/2017   MPG 202.99 09/11/2017   No results found for: PROLACTIN Lab Results  Component Value Date   CHOL 217 (H) 09/11/2017   TRIG 135 09/11/2017   HDL 49 09/11/2017   CHOLHDL 4.4 09/11/2017   VLDL 27 09/11/2017   LDLCALC 141 (H) 09/11/2017   LDLCALC 216 (H) 08/23/2017    Physical Findings: AIMS: Facial and Oral Movements Muscles of Facial Expression: None, normal Lips and Perioral Area: None, normal Jaw: None, normal Tongue: None, normal,Extremity Movements Upper (arms, wrists, hands, fingers): None, normal Lower (legs, knees, ankles, toes): None, normal, Trunk Movements Neck, shoulders, hips: None, normal, Overall Severity Severity of abnormal movements (highest score from questions above): None, normal Incapacitation due to abnormal movements: None, normal Patient's awareness of abnormal movements (rate only patient's report): No Awareness, Dental Status Current problems with teeth and/or dentures?: No Does patient usually wear dentures?: No  CIWA:    COWS:     Musculoskeletal: Strength & Muscle Tone: decreased Gait & Station: unsteady Patient leans: Backward  Psychiatric Specialty Exam: Physical Exam  Nursing note and vitals reviewed. Psychiatric: His affect is blunt. His speech is  delayed. He is slowed and withdrawn. Cognition and memory are normal. He expresses impulsivity. He exhibits a depressed mood. He expresses suicidal ideation.    Review of Systems  Musculoskeletal: Positive for falls.  Neurological: Positive for weakness and headaches.  Psychiatric/Behavioral: Positive for depression and suicidal ideas.  All other systems reviewed and are negative.   Blood pressure 119/76, pulse 78, temperature 98 F (36.7 C), temperature source Oral, resp. rate 20, height 6\' 2"  (1.88 m), weight 97.1 kg (214 lb), SpO2 100 %.Body mass index is 27.48 kg/m.  General Appearance: Disheveled  Eye Contact:  Minimal  Speech:  Slow  Volume:  Decreased  Mood:  Depressed and Hopeless  Affect:  Blunt  Thought Process:  Goal Directed and Descriptions of Associations: Intact  Orientation:  Full (Time, Place, and Person)  Thought Content:  WDL  Suicidal Thoughts:  Yes.  with intent/plan  Homicidal Thoughts:  No  Memory:  Immediate;   Fair Recent;   Fair Remote;   Fair  Judgement:  Impaired  Insight:  Lacking  Psychomotor Activity:  Psychomotor Retardation  Concentration:  Concentration: Fair and Attention Span: Fair  Recall:  [employer] of Knowledge:  Fair  Language:  Fair  Akathisia:  No  Handed:  Right  AIMS (if indicated):     Assets:  Communication Skills Desire for Improvement Resilience Social Support  ADL's:  Intact  Cognition:  WNL  Sleep:  Number of Hours: 7     Treatment Plan Summary: Daily contact with patient to assess and evaluate symptoms and progress in treatment and Medication management   Mr. Gladson is a 31 year old male with a history ofMDD, severe with suicidal ideation, type 1 diabetes and treatment noncompliance transferred from ICU where he was hospitalized for hypoglycemia from presumed insulin overdose which the patient denies. He is still suicidal and depressed.  #Suicidal ideation -patientable to contract for safety in the  hospital  #Mood -continue Haldol  5 mg BID -continue Depakote 500 mg TID, VPA level 93. Recheck VPA level and ammonia -continue Effexor 300 mg daily -Continue Remeron 30 mg nightly -if discharged to the homeless shelter he may only take 2 psychotropic medications  #Diabetes, poorly controlled -Lantus38units, ADA diet, SSI, blood glucose monitoring -Appreciate recommendations from diabetes coordinator. -NovoLog insulin6units thrice daily as scheduled, hold if patient does not eat a meal or eats less than 50% of his meal  #Insomnia -Trazodone 100 mg nightly  #Social -patient has no income, health insurance, housing or support -will apply for SSDI and Medicaid on Monday with help  #Falls, truncal ataxia, hydrocephalus -PT consultis appreciated -Neurosurgery consult is appreciated -Head Ct scan revealedSevere obstructive hydrocephalus unchanged from 2013. Findings consistent with aqueductal stenosis.  #Disposition -TBE       Kristine LineaJolanta Jadier Rockers, MD 09/24/2017, 11:31 AM

## 2017-09-24 NOTE — Progress Notes (Signed)
Recreation Therapy Notes  Date: 12.04.2018  Time: 1:00pm  Location: Craft Room  Behavioral response: N/A  Intervention Topic: Goals  Discussion/Intervention: Patient did not attend group. Clinical Observations/Feedback:  Patient did not attend group.  Kameryn Davern LRT/CTRS         Giorgio Chabot 09/24/2017 2:20 PM

## 2017-09-24 NOTE — Progress Notes (Signed)
D: Patient reports passive SI and hopelessness, denies HI/AVH. Patient has complaint of minor anxiety elevation and insomnia. Patient is seen in milieu interacting with peers.   A: Patient was assessed by this nurse. Patient verbally contracts for safety. Patient encouraged to report any thoughts of SI/HI/AVH to staff.  Patient received scheduled medications. Q x 15 minute observation checks were completed for safety, patient is on high risk fall precautions. Patient was provided with verbal education on provided medications. Patient care plan was reviewed. Patient was offered support and encouragement. Patient was encourage to attend groups, participate in unit activities and continue with plan of care.   R: Patient adheres with scheduled medication. Patient responded well to PRN medications and symptoms were relieved. Patient has no complaints of pain at this time. Patient is receptive to treatment and safety maintained on unit. Patient had an estimated 6 hours 30 minutes of restful sleep.

## 2017-09-25 LAB — AMMONIA: AMMONIA: 23 umol/L (ref 9–35)

## 2017-09-25 LAB — GLUCOSE, CAPILLARY
Glucose-Capillary: 120 mg/dL — ABNORMAL HIGH (ref 65–99)
Glucose-Capillary: 113 mg/dL — ABNORMAL HIGH (ref 65–99)
Glucose-Capillary: 191 mg/dL — ABNORMAL HIGH (ref 65–99)
Glucose-Capillary: 194 mg/dL — ABNORMAL HIGH (ref 65–99)

## 2017-09-25 LAB — VALPROIC ACID LEVEL: VALPROIC ACID LVL: 32 ug/mL — AB (ref 50.0–100.0)

## 2017-09-25 MED ORDER — HALOPERIDOL DECANOATE 100 MG/ML IM SOLN: 100.0000 mg | INTRAMUSCULAR | 1 refills | Status: DC

## 2017-09-25 MED ORDER — HALOPERIDOL DECANOATE 100 MG/ML IM SOLN
100.0000 mg | INTRAMUSCULAR | Status: DC
  Administered 2017-09-25: 100 mg via INTRAMUSCULAR
  Filled 2017-09-25: qty 1

## 2017-09-25 MED ORDER — VENLAFAXINE HCL ER 150 MG PO CP24: 300.0000 mg | ORAL_CAPSULE | Freq: Every day | ORAL | 1 refills | Status: DC

## 2017-09-25 MED ORDER — INSULIN GLARGINE 100 UNIT/ML ~~LOC~~ SOLN: 38.0000 [IU] | SUBCUTANEOUS | 0 refills | Status: DC

## 2017-09-25 NOTE — [facility] Group Notes (Signed)
  09/25/2017  Time: 0930  Type of Therapy/Topic:  Group Therapy:  Emotion Regulation  Participation Level:  Did Not Attend   Description of Group:    The purpose of this group is to assist patients in learning to regulate negative emotions and experience positive emotions. Patients will be guided to discuss ways in which they have been vulnerable to their negative emotions. These vulnerabilities will be juxtaposed with experiences of positive emotions or situations, and patients will be challenged to use positive emotions to combat negative ones. Special emphasis will be placed on coping with negative emotions in conflict situations, and patients will process healthy conflict resolution skills.  Therapeutic Goals: 1. Patient will identify two positive emotions or experiences to reflect on in order to balance out negative emotions 2. Patient will label two or more emotions that they find the most difficult to experience 3. Patient will demonstrate positive conflict resolution skills through discussion and/or role plays  Summary of Patient Progress: Pt was invited to attend group but chose not to attend. CSW will continue to encourage pt to attend group throughout their admission.       Therapeutic Modalities:   Cognitive Behavioral Therapy Feelings Identification Dialectical Behavioral Therapy   Heidi DachKelsey Ambriana Selway, MSW, LCSW 09/25/2017 10:13 AM

## 2017-09-25 NOTE — Progress Notes (Signed)
Physical Therapy Treatment Patient Details Name: Mark [name] MRN: [id number] DOB: 12/27/1985 Today's Date: 09/25/2017    History of Present Illness [name] is a 31 year old male with a history ofMDD, severe with suicidal ideation, type 1 diabetes and treatment noncompliance transferred from ICU where he was hospitalized for severe hypoglycemia from presumed insulin overdose.CT head revealed severe obstructive hydrocephalus unchanged since 2013.  Neuro consult pending.  Assessment includes truncal ataxia with hydrocephalus.     PT Comments    Pt continues to present with deficits in transfers, gait, and balance but is making good progress with therapy.  Pt presented with improved concentric and eccentric control this session during sit to/from stand transfers with fair carryover for increased trunk flexion especially during stand to sit. Pt reports has been using RW for ambulation and presented with grossly improved stability during amb this date without LOB.  Pt did require mod verbal and visual cues for amb closer to RW and to stay within RW during turns with decreased speed for improved safety.  Pt presented with significantly improved static and dynamic balance this session with no instances of posterior LOB.  Overall pt remains at a relatively high risk for falls but is responding well to rehab and would benefit from continued skilled PT services and 24/hr supervision with mobility upon discharge from hospital.       Follow Up Recommendations  SNF;Supervision for mobility/OOB     Equipment Recommendations  Rolling walker with 5" wheels    Recommendations for Other Services       Precautions / Restrictions Precautions Precautions: Fall Restrictions Weight Bearing Restrictions: No    Mobility  Bed Mobility Overal bed mobility: Independent             General bed mobility comments: No physical assist or cues needed.  Pt performs independently.   Transfers Overall  transfer level: Needs assistance Equipment used: Rolling walker (2 wheeled) Transfers: Sit to/from Stand Sit to Stand: Supervision         General transfer comment: Improved concentric and eccentric control this session with fair carryover for increased trunk flexion especially during stand to sit.  Ambulation/Gait Ambulation/Gait assistance: Supervision Ambulation Distance (Feet): 150 Feet x 2 Assistive device: Rolling walker (2 wheeled) Gait Pattern/deviations: Step-through pattern;Decreased step length - right;Decreased step length - left;Ataxic   Gait velocity interpretation: <1.8 ft/sec, indicative of risk for recurrent falls General Gait Details: Pt reports has been using RW for ambulation; grossly improved stability during amb this date without LOB.  Mod verbal and visual cues given for sequencing with gait and during turns for amb closer to RW while walking and within RW during turns.    Stairs            Wheelchair Mobility    Modified Rankin (Stroke Patients Only)       Balance Overall balance assessment: Needs assistance Sitting-balance support: Feet supported;No upper extremity supported Sitting balance-Leahy Scale: Good     Standing balance support: No upper extremity supported;During functional activity Standing balance-Leahy Scale: Fair Standing balance comment: Significantly improved static and dynamic balance this session with no instances of posterior LOB.                            Cognition Arousal/Alertness: Awake/alert Behavior During Therapy: WFL for tasks assessed/performed Overall Cognitive Status: Within Functional Limits for tasks assessed  Exercises Other Exercises Other Exercises: Sit to stand 2 x 5 with emphasis on controlled eccentric phase and min cues for increased anterior weight shift Other Exercises: Static standing balance training with feet apart, together, and  semi-tandem with eyes open/closed and head still/head turns with fair stability throughout. Other Exercises: Dynamic standing balance training with reaching outside BOS with feet apart and feet together with fair stability throuhout.    General Comments        Pertinent Vitals/Pain Pain Assessment: No/denies pain    Home Living                      Prior Function            PT Goals (current goals can now be found in the care plan section) Progress towards PT goals: Progressing toward goals    Frequency    Min 2X/week      PT Plan Current plan remains appropriate    Co-evaluation              AM-PAC PT "6 Clicks" Daily Activity  Outcome Measure                   End of Session Equipment Utilized During Treatment: Gait belt Activity Tolerance: Patient tolerated treatment well Patient left: in bed Nurse Communication: Mobility status PT Visit Diagnosis: Unsteadiness on feet (R26.81);Repeated falls (R29.6);Other abnormalities of gait and mobility (R26.89);History of falling (Z91.81);Difficulty in walking, not elsewhere classified (R26.2)     Time: [id number] PT Time Calculation (min) (ACUTE ONLY): 25 min  Charges:  $Gait Training: 8-22 mins $Therapeutic Exercise: 8-22 mins                    G Codes:       Elly Modena. Scott Shandora Koogler PT, DPT 09/25/17, 2:08 PM

## 2017-09-25 NOTE — [facility] Group Notes (Signed)
[facility] Group Notes:  (Nursing/MHT/Case Management/Adjunct)  Date:  09/25/2017  Time:  9:42 PM  Type of Therapy:  Group Therapy  Participation Level:  Active  Participation Quality:  Appropriate  Affect:  Appropriate  Cognitive:  Appropriate  Insight:  Good  Engagement in Group:  Engaged  Modes of Intervention:  Support  Summary of Progress/Problems:  Mayra NeerJackie L Jamell Laymon 09/25/2017, 9:42 PM

## 2017-09-25 NOTE — Progress Notes (Signed)
Patient refused all morning medications as well as his sliding scale insulin. Patient also refused breakfast.

## 2017-09-25 NOTE — Progress Notes (Signed)
D- Patient alert and oriented. Patient presents in an agitated/aggravated mood. Patient didn't want to be bothered and refused all morning medications. Patient continues to endorse passive SI without a plan. Patient denies HI, AVH, and pain at this time.    A- Scheduled medications administered to patient during the second part of the day, per MD orders. Support and encouragement provided.  Routine safety checks conducted every 15 minutes.  Patient informed to notify staff with problems or concerns.  R- No adverse drug reactions noted. Patient contracts for safety at this time. Patient compliant with medications and treatment plan. Patient receptive, calm, and cooperative. Patient interacts well with others on the unit.  Patient remains safe at this time.

## 2017-09-25 NOTE — Progress Notes (Addendum)
[facility] Progress [facility]  09/26/2017 11:54 AM Mark [name]  MRN:  [id number]  Subjective:   [name] seems a little better today. He denies suicidal ideation and was able to participate in discharge planning. We [facility]. He worked well with PT today and shows improvement. Still, continuous rehabilitation following discharge is recommended. Patient has no resources.  Treatment plan. The patient has been refusing medications periodically. His VPA level from therapeutic fell to 32 today. I will discontinue Depakote. We will continue Haldol, Effexor and Remeron.  Social/disposition. Discharge to the shelter. Patient does not qualify for Medicaid. SSDI application submitted but waiting time is apparently 3-4 months.   Principal Problem: Severe recurrent major depression with psychotic features [facility](HCC) Diagnosis:   Patient Active Problem List   Diagnosis Date Noted  . 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 Diabetes (HCC) [E11.9] 09/11/2017    Priority: Medium  . 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 Obstructive hydrocephalus [G91.1] 09/19/2017  . Hypoglycemia [E16.2] 09/09/2017  . MDD (major depressive disorder) [F32.9] 08/22/2017  . Nausea & vomiting [R11.2] 08/18/2017  . Abdominal pain [R10.9] 08/18/2017  . Hyponatremia [E87.1] 08/18/2017  . DKA (diabetic ketoacidoses) (HCC) [E13.10] 11/24/2015  . Diabetic keto-acidosis (HCC) [E13.10] 10/23/2015   Total Time spent with patient: 30 minutes  Past Psychiatric History: depression.  Past Medical History:  Past Medical History:  Diagnosis Date  . Diabetes mellitus without complication (HCC)   . Hydrocephalus   . Kidney stones     Past Surgical History:  Procedure Laterality Date  . KIDNEY STONE SURGERY     Family History:  Family History  Problem Relation Age of Onset  . Breast cancer Mother    Family Psychiatric   History: none reported. Social History:  Social History   Substance and Sexual Activity  Alcohol Use No   Comment: ocasional      Social History   Substance and Sexual Activity  Drug Use Yes  . Types: Marijuana   Comment: "as often as I can"    Social History   Socioeconomic History  . Marital status: Single    Spouse name: None  . Number of children: None  . Years of education: None  . Highest education level: None  Social Needs  . Financial resource strain: None  . Food insecurity - worry: None  . Food insecurity - inability: None  . Transportation needs - medical: None  . Transportation needs - non-medical: None  Occupational History  . None  Tobacco Use  . Smoking status: Current Every Day Smoker    Packs/day: 1.00    Types: Cigarettes  . Smokeless tobacco: Never Used  Substance and Sexual Activity  . Alcohol use: No    Comment: ocasional   . Drug use: Yes    Types: Marijuana    Comment: "as often as I can"  . Sexual activity: None  Other Topics Concern  . None  Social History Narrative  . None   Additional Social History:  Specify valuables returned: belt brown                      Sleep: Fair  Appetite:  Fair  Current Medications: Current Facility-Administered Medications  Medication Dose Route Frequency Provider Last Rate Last Dose  . acetaminophen (TYLENOL) tablet 650 mg  650 mg Oral Q6H PRN Clapacs, Jackquline DenmarkJohn T, MD  650 mg at 09/17/17 2127  . alum & mag hydroxide-simeth (MAALOX/MYLANTA) 200-200-20 MG/5ML suspension 30 mL  30 mL Oral Q4H PRN Clapacs, [name] T, MD      . feeding supplement (GLUCERNA SHAKE) (GLUCERNA SHAKE) liquid 237 mL  237 mL Oral TID BM Glynnis Gavel B, MD   237 mL at 09/25/17 1030  . gabapentin (NEURONTIN) capsule 1,200 mg  1,200 mg Oral TID Erica Richwine B, MD   1,200 mg at 09/26/17 1010  . haloperidol (HALDOL) tablet 5 mg  5 mg Oral BID Sheily Lineman B, MD   5 mg at 09/26/17 1011  . haloperidol  decanoate (HALDOL DECANOATE) 100 MG/ML injection 100 mg  100 mg Intramuscular Q28 days Brezlyn Manrique B, MD   100 mg at 09/25/17 1745  . hydrOXYzine (ATARAX/VISTARIL) tablet 25 mg  25 mg Oral Q4H PRN Aundria Rudakesh, Gopalkumar, MD   25 mg at 09/25/17 2133  . Influenza vac split quadrivalent PF (FLUARIX) injection 0.5 mL  0.5 mL Intramuscular Tomorrow-1000 Holy Battenfield B, MD      . insulin aspart (novoLOG) injection 0-5 Units  0-5 Units Subcutaneous QHS Camilah Spillman B, MD   2 Units at 09/24/17 2102  . insulin aspart (novoLOG) injection 0-9 Units  0-9 Units Subcutaneous TID WC Sedona Wenk B, MD   2 Units at 09/25/17 1719  . insulin aspart (novoLOG) injection 6 Units  6 Units Subcutaneous TID WC Kiah Vanalstine B, MD   6 Units at 09/25/17 1718  . insulin glargine (LANTUS) injection 38 Units  38 Units Subcutaneous QHS Faizah Kandler B, MD   38 Units at 09/25/17 2131  . magnesium hydroxide (MILK OF MAGNESIA) suspension 30 mL  30 mL Oral Daily PRN Clapacs, [name] T, MD      . mirtazapine (REMERON) tablet 30 mg  30 mg Oral QHS Clapacs, Jackquline DenmarkJohn T, MD   30 mg at 09/25/17 2131  . nicotine (NICODERM CQ - dosed in mg/24 hours) patch 21 mg  21 mg Transdermal Daily Avalie Oconnor B, MD   21 mg at 09/26/17 1011  . polyethylene glycol (MIRALAX / GLYCOLAX) packet 17 g  17 g Oral Daily PRN Clapacs, [name] T, MD      . traZODone (DESYREL) tablet 200 mg  200 mg Oral QHS Adaliz Dobis B, MD   200 mg at 09/25/17 2131  . venlafaxine XR (EFFEXOR-XR) 24 hr capsule 300 mg  300 mg Oral Q breakfast Lamara Brecht B, MD   300 mg at 09/26/17 1011    Lab Results:  Results for orders placed or performed during the hospital encounter of 09/10/17 (from the past 48 hour(s))  Glucose, capillary     Status: Abnormal   Collection Time: 09/24/17  4:26 PM  Result Value Ref Range   Glucose-Capillary 180 (H) 65 - 99 mg/dL  Glucose, capillary     Status: Abnormal   Collection Time: 09/24/17  8:59 PM   Result Value Ref Range   Glucose-Capillary 230 (H) 65 - 99 mg/dL   Comment 1 Notify RN   Glucose, capillary     Status: Abnormal   Collection Time: 09/25/17  7:00 AM  Result Value Ref Range   Glucose-Capillary 120 (H) 65 - 99 mg/dL   Comment 1 Notify RN   Valproic acid level     Status: Abnormal   Collection Time: 09/25/17  7:04 AM  Result Value Ref Range   Valproic Acid Lvl 32 (L) 50.0 - 100.0 ug/mL  Ammonia  Status: None   Collection Time: 09/25/17  7:04 AM  Result Value Ref Range   Ammonia 23 9 - 35 umol/L  Glucose, capillary     Status: Abnormal   Collection Time: 09/25/17 11:21 AM  Result Value Ref Range   Glucose-Capillary 113 (H) 65 - 99 mg/dL   Comment 1 Notify RN   Glucose, capillary     Status: Abnormal   Collection Time: 09/25/17  4:31 PM  Result Value Ref Range   Glucose-Capillary 191 (H) 65 - 99 mg/dL  Glucose, capillary     Status: Abnormal   Collection Time: 09/25/17  9:23 PM  Result Value Ref Range   Glucose-Capillary 194 (H) 65 - 99 mg/dL  Glucose, capillary     Status: Abnormal   Collection Time: 09/26/17  7:00 AM  Result Value Ref Range   Glucose-Capillary 210 (H) 65 - 99 mg/dL    Blood Alcohol level:  Lab Results  Component Value Date   ETH <10 09/08/2017   ETH <10 08/18/2017    Metabolic Disorder Labs: Lab Results  Component Value Date   HGBA1C 8.7 (H) 09/11/2017   MPG 202.99 09/11/2017   No results found for: PROLACTIN Lab Results  Component Value Date   CHOL 217 (H) 09/11/2017   TRIG 135 09/11/2017   HDL 49 09/11/2017   CHOLHDL 4.4 09/11/2017   VLDL 27 09/11/2017   LDLCALC 141 (H) 09/11/2017   LDLCALC 216 (H) 08/23/2017    Physical Findings: AIMS: Facial and Oral Movements Muscles of Facial Expression: None, normal Lips and Perioral Area: None, normal Jaw: None, normal Tongue: None, normal,Extremity Movements Upper (arms, wrists, hands, fingers): None, normal Lower (legs, knees, ankles, toes): None, normal, Trunk  Movements Neck, shoulders, hips: None, normal, Overall Severity Severity of abnormal movements (highest score from questions above): None, normal Incapacitation due to abnormal movements: None, normal Patient's awareness of abnormal movements (rate only patient's report): No Awareness, Dental Status Current problems with teeth and/or dentures?: No Does patient usually wear dentures?: No  CIWA:    COWS:     Musculoskeletal: Strength & Muscle Tone: within normal limits Gait & Station: normal Patient leans: N/A  Psychiatric Specialty Exam: Physical Exam  Nursing note and vitals reviewed. Psychiatric: His speech is normal and behavior is normal. Thought content normal. His mood appears anxious. Cognition and memory are normal. He expresses impulsivity.    Review of Systems  Musculoskeletal: Positive for falls.  Neurological: Positive for weakness.  Psychiatric/Behavioral: Positive for depression. The patient is nervous/anxious.   All other systems reviewed and are negative.   Blood pressure 104/62, pulse 73, temperature 97.9 F (36.6 C), temperature source Oral, resp. rate 20, height 6\' 2"  (1.88 m), weight 97.1 kg (214 lb), SpO2 99 %.Body mass index is 27.48 kg/m.  General Appearance: Casual  Eye Contact:  Good  Speech:  Clear and Coherent  Volume:  Normal  Mood:  Anxious  Affect:  Appropriate  Thought Process:  Goal Directed and Descriptions of Associations: Intact  Orientation:  Full (Time, Place, and Person)  Thought Content:  WDL  Suicidal Thoughts:  No  Homicidal Thoughts:  No  Memory:  Immediate;   Fair Recent;   Fair Remote;   Fair  Judgement:  Impaired  Insight:  Present  Psychomotor Activity:  Normal  Concentration:  Concentration: Fair and Attention Span: Fair  Recall:  [employer] of Knowledge:  Fair  Language:  Fair  Akathisia:  No  Handed:  Right  AIMS (  if indicated):     Assets:  Communication Skills Desire for Improvement Resilience Social Support   ADL's:  Intact  Cognition:  WNL  Sleep:  Number of Hours: 6.3     Treatment Plan Summary: Daily contact with patient to assess and evaluate symptoms and progress in treatment and Medication management   Mark [name] is a 31 year old male with a history ofMDD, severe with suicidal ideation, type 1 diabetes and treatment noncompliance transferred from ICU where he was hospitalized for hypoglycemia from presumed insulin overdose which the patient denies. He is still suicidal and depressed.  #Suicidal ideation -patientable to contract for safety in the hospital  #Mood -continue Haldol 5 mg BID -discontinue Depakote  -continue Effexor 300 mg daily -Continue Remeron 30 mg nightly -if discharged to the homeless shelter he may only take 2 psychotropic medications  #Diabetes, poorly controlled -Lantus38units, ADA diet, SSI, blood glucose monitoring -Appreciate recommendations from diabetes coordinator. -NovoLog insulin6units thrice daily as scheduled, hold if patient does not eat a meal or eats less than 50% of his meal  #Insomnia -Trazodone 100 mg nightly  #Social -patient has no income, health insurance, housing or support -will apply for SSDI and Medicaid on Monday with help  #Falls, truncal ataxia, hydrocephalus -PT consultis appreciated -Neurosurgery consult is appreciated -Head Ct scan revealedSevere obstructive hydrocephalus unchanged from 2013. Findings consistent with aqueductal stenosis.  #Disposition -[facility]      Kristine Linea, MD 09/26/2017, 11:54 AM

## 2017-09-25 NOTE — Plan of Care (Signed)
Patient presents in a agitated mood refusing his morning medications not wanting to be bothered. Patient continues to endorse passive SI without a plan. Patient verbalizes understanding of the general information that have been given to him. Patient has remained free from injury and remains safe on the unit at this time.

## 2017-09-25 NOTE — [facility] Group Notes (Signed)
[facility] Group Notes:  (Nursing/MHT/Case Management/Adjunct)  Date:  09/25/2017  Time:  1:52 AM  Type of Therapy:  Psychoeducational Skills  Participation Level:  Active  Participation Quality:  Appropriate  Affect:  Appropriate  Cognitive:  Appropriate  Insight:  Appropriate  Engagement in Group:  Engaged  Modes of Intervention:  Discussion, Socialization and Support  Summary of Progress/Problems:  Peine SingerJustin  Ladonte Verstraete 09/25/2017, 1:52 AM

## 2017-09-25 NOTE — Progress Notes (Addendum)
Recreation Therapy Notes  Date: 12.05.2018  Time: 1:00pm   Location: Craft Room  Behavioral response: N/A  Intervention Topic: Communication  Discussion/Intervention: Patient did not attend group.  Clinical Observations/Feedback:  Patient did not attend group. Melanie Pellot LRT/CTRS         Mark [name] 09/25/2017 1:55 PM

## 2017-09-25 NOTE — Progress Notes (Signed)
Recreation Therapy Notes  Date: 12.05.2018  Time: 3:00pm  Location: Craft room  Behavioral response: Appropriate  Group Type: Art/Craft  Participation level: Active  Communication: Patient was social with peers and staff.  Comments: N/A  Jorah Hua LRT/CTRS        Joyel Chenette 09/25/2017 4:17 PM

## 2017-09-26 ENCOUNTER — Encounter: Payer: Self-pay | Admitting: Emergency Medicine

## 2017-09-26 ENCOUNTER — Other Ambulatory Visit: Payer: Self-pay

## 2017-09-26 ENCOUNTER — Emergency Department
Admission: EM | Admit: 2017-09-26 | Discharge: 2017-10-03 | Disposition: A | Discharge status: Home / Self Care | Payer: No Typology Code available for payment source | Attending: Emergency Medicine | Admitting: Emergency Medicine

## 2017-09-26 DIAGNOSIS — E119 Type 2 diabetes mellitus without complications: Secondary | ICD-10-CM | POA: Insufficient documentation

## 2017-09-26 DIAGNOSIS — R739 Hyperglycemia, unspecified: Secondary | ICD-10-CM

## 2017-09-26 DIAGNOSIS — R45851 Suicidal ideations: Secondary | ICD-10-CM | POA: Insufficient documentation

## 2017-09-26 DIAGNOSIS — F609 Personality disorder, unspecified: Secondary | ICD-10-CM

## 2017-09-26 DIAGNOSIS — Z79899 Other long term (current) drug therapy: Secondary | ICD-10-CM | POA: Insufficient documentation

## 2017-09-26 DIAGNOSIS — Z794 Long term (current) use of insulin: Secondary | ICD-10-CM | POA: Insufficient documentation

## 2017-09-26 DIAGNOSIS — F329 Major depressive disorder, single episode, unspecified: Secondary | ICD-10-CM | POA: Diagnosis present

## 2017-09-26 DIAGNOSIS — F17210 Nicotine dependence, cigarettes, uncomplicated: Secondary | ICD-10-CM | POA: Insufficient documentation

## 2017-09-26 DIAGNOSIS — G911 Obstructive hydrocephalus: Secondary | ICD-10-CM | POA: Diagnosis present

## 2017-09-26 DIAGNOSIS — F32A Depression, unspecified: Secondary | ICD-10-CM

## 2017-09-26 DIAGNOSIS — F333 Major depressive disorder, recurrent, severe with psychotic symptoms: Secondary | ICD-10-CM | POA: Insufficient documentation

## 2017-09-26 LAB — URINE DRUG SCREEN, QUALITATIVE (ARMC ONLY)
Amphetamines, Ur Screen: NOT DETECTED
Barbiturates, Ur Screen: NOT DETECTED
Benzodiazepine, Ur Scrn: NOT DETECTED
CANNABINOID 50 NG, UR ~~LOC~~: NOT DETECTED
COCAINE METABOLITE, UR ~~LOC~~: NOT DETECTED
MDMA (ECSTASY) UR SCREEN: NOT DETECTED
Methadone Scn, Ur: NOT DETECTED
Opiate, Ur Screen: NOT DETECTED
PHENCYCLIDINE (PCP) UR S: NOT DETECTED
Tricyclic, Ur Screen: NOT DETECTED

## 2017-09-26 LAB — SALICYLATE LEVEL: Salicylate Lvl: <7 mg/dL (ref 2.8–30.0)

## 2017-09-26 LAB — CBC
HCT: 41.2 % (ref 40.0–52.0)
HEMOGLOBIN: 13.8 g/dL (ref 13.0–18.0)
MCH: 33.3 pg (ref 26.0–34.0)
MCHC: 33.4 g/dL (ref 32.0–36.0)
MCV: 99.6 fL (ref 80.0–100.0)
Platelets: 230 10*3/uL (ref 150–440)
RBC: 4.14 MIL/uL — AB (ref 4.40–5.90)
RDW: 13.6 % (ref 11.5–14.5)
WBC: 9.1 10*3/uL (ref 3.8–10.6)

## 2017-09-26 LAB — URINALYSIS, COMPLETE (UACMP) WITH MICROSCOPIC
BACTERIA UA: NONE SEEN
BILIRUBIN URINE: NEGATIVE
Glucose, UA: >=500 mg/dL — AB
Ketones, ur: NEGATIVE mg/dL
Leukocytes, UA: NEGATIVE
NITRITE: NEGATIVE
PROTEIN: NEGATIVE mg/dL
SPECIFIC GRAVITY, URINE: 1.017 (ref 1.005–1.030)
Squamous Epithelial / LPF: NONE SEEN
pH: 7 (ref 5.0–8.0)

## 2017-09-26 LAB — COMPREHENSIVE METABOLIC PANEL
ALT: 19 U/L (ref 17–63)
AST: 27 U/L (ref 15–41)
Albumin: 4.4 g/dL (ref 3.5–5.0)
Alkaline Phosphatase: 124 U/L (ref 38–126)
Anion gap: 12 (ref 5–15)
BUN: 16 mg/dL (ref 6–20)
CHLORIDE: 93 mmol/L — AB (ref 101–111)
CO2: 27 mmol/L (ref 22–32)
CREATININE: 0.99 mg/dL (ref 0.61–1.24)
Calcium: 9.6 mg/dL (ref 8.9–10.3)
GFR calc Af Amer: >60 mL/min (ref >60)
GFR calc non Af Amer: >60 mL/min (ref >60)
GLUCOSE: 632 mg/dL — AB (ref 65–99)
Potassium: 4.6 mmol/L (ref 3.5–5.1)
SODIUM: 132 mmol/L — AB (ref 135–145)
Total Bilirubin: 0.3 mg/dL (ref 0.3–1.2)
Total Protein: 7.6 g/dL (ref 6.5–8.1)

## 2017-09-26 LAB — ETHANOL: Alcohol, Ethyl (B): <10 mg/dL (ref <10)

## 2017-09-26 LAB — GLUCOSE, CAPILLARY
GLUCOSE-CAPILLARY: 580 mg/dL — AB (ref 65–99)
GLUCOSE-CAPILLARY: 271 mg/dL — AB (ref 65–99)
Glucose-Capillary: 210 mg/dL — ABNORMAL HIGH (ref 65–99)
Glucose-Capillary: 415 mg/dL — ABNORMAL HIGH (ref 65–99)
Glucose-Capillary: 365 mg/dL — ABNORMAL HIGH (ref 65–99)

## 2017-09-26 LAB — ACETAMINOPHEN LEVEL: Acetaminophen (Tylenol), Serum: <10 ug/mL — ABNORMAL LOW (ref 10–30)

## 2017-09-26 MED ORDER — MIRTAZAPINE 15 MG PO TABS
30.0000 mg | ORAL_TABLET | Freq: Every day | ORAL | Status: DC
  Administered 2017-09-26 – 2017-09-30 (×5): 30 mg via ORAL
  Filled 2017-09-26 (×4): qty 2

## 2017-09-26 MED ORDER — INSULIN ASPART 100 UNIT/ML ~~LOC~~ SOLN
6.0000 [IU] | Freq: Three times a day (TID) | SUBCUTANEOUS | Status: DC
  Administered 2017-09-26 – 2017-10-03 (×15): 6 [IU] via SUBCUTANEOUS
  Filled 2017-09-26 (×16): qty 1

## 2017-09-26 MED ORDER — MIRTAZAPINE 15 MG PO TABS
ORAL_TABLET | ORAL | Status: AC
  Filled 2017-09-26: qty 2

## 2017-09-26 MED ORDER — GABAPENTIN 400 MG PO CAPS
1200.0000 mg | ORAL_CAPSULE | Freq: Three times a day (TID) | ORAL | Status: DC
  Administered 2017-09-27 – 2017-10-03 (×19): 1200 mg via ORAL
  Filled 2017-09-26 (×2): qty 3
  Filled 2017-09-26: qty 4
  Filled 2017-09-26 (×3): qty 3
  Filled 2017-09-26: qty 4
  Filled 2017-09-26 (×8): qty 3
  Filled 2017-09-26: qty 4
  Filled 2017-09-26 (×8): qty 3

## 2017-09-26 MED ORDER — GABAPENTIN 600 MG PO TABS
ORAL_TABLET | ORAL | Status: AC
  Administered 2017-09-26: 1200 mg
  Filled 2017-09-26: qty 2

## 2017-09-26 MED ORDER — TRAZODONE HCL 100 MG PO TABS
100.0000 mg | ORAL_TABLET | Freq: Every day | ORAL | Status: DC
  Administered 2017-09-26 – 2017-10-02 (×7): 100 mg via ORAL
  Filled 2017-09-26 (×7): qty 1

## 2017-09-26 MED ORDER — VENLAFAXINE HCL ER 75 MG PO CP24
300.0000 mg | ORAL_CAPSULE | Freq: Every day | ORAL | Status: DC
  Administered 2017-09-27 – 2017-10-03 (×7): 300 mg via ORAL
  Filled 2017-09-26 (×2): qty 4
  Filled 2017-09-26: qty 2
  Filled 2017-09-26: qty 4
  Filled 2017-09-26: qty 2
  Filled 2017-09-26: qty 8
  Filled 2017-09-26 (×3): qty 4

## 2017-09-26 MED ORDER — SODIUM CHLORIDE 0.9 % IV BOLUS (SEPSIS)
1000.0000 mL | Freq: Once | INTRAVENOUS | Status: AC
  Administered 2017-09-26: 1000 mL via INTRAVENOUS

## 2017-09-26 MED ORDER — INSULIN GLARGINE 100 UNIT/ML ~~LOC~~ SOLN
38.0000 [IU] | Freq: Every day | SUBCUTANEOUS | Status: DC
  Administered 2017-09-26 – 2017-10-02 (×7): 38 [IU] via SUBCUTANEOUS
  Filled 2017-09-26 (×11): qty 0.38

## 2017-09-26 MED ORDER — INSULIN ASPART 100 UNIT/ML ~~LOC~~ SOLN
SUBCUTANEOUS | Status: AC
  Filled 2017-09-26: qty 1

## 2017-09-26 NOTE — ED Provider Notes (Addendum)
-----------------------------------------   7:34 AM on 09/27/2017 -----------------------------------------  Dr. Toni Amendlapacs saw the patient yesterday (note is in the system), and the patient is reportedly being referred out for placement.  Calm and cooperative at this time.    ----------------------------------------- 11:36 PM on 09/26/2017 -----------------------------------------  Patient with insomnia.  Rolm GalaErik Banker(RN) [facility], verified that the patient has been getting Remeron 30 mg PO QHS for at least the last three days while he was [facility] [place] [facility].  I looked in the psychiatrist's discharge summary and do not see it mentioned.  However, it is possible he is having some "rebound" insomnia if he has been on Remeron for awhile, so I ordered it at the same dose he was getting while inpatient in [facility].   [name] RoseForbach, Quantay Zaremba, MD 09/26/17 [phone]    [name] RoseForbach, Yahsir Wickens, MD 09/27/17 [phone]

## 2017-09-26 NOTE — Progress Notes (Signed)
  [facility] :  Will you be returning to the same living situation after discharge:  No.Pt was not able to return to previous placement. Pt has been [facility] [place].  At discharge, do you have transportation home?: Yes,  [name]  Do you have the ability to pay for your medications: No. [facility] will [facility]   Release of information consent forms completed and in the chart;  Patient's signature needed at discharge.  Patient to Follow up at: Follow-up Information    Services, [facility] Follow up in 7 day(s).   Why:  Please walk [facility] to [facility] Monday-Friday, 8:30AM-3:30PM.  Contact information: [address] [place] [address] [phone]        Heidi Dachraig, Kailea Dannemiller, LCSW Follow up.   Specialty:  Licensed Clinical Social Chief of StaffWorker       [facility] Care Coordinator Follow up.   Why:  Alan RipperShuvan Thompson will [place] [facility] in order to help facilitate transferring services.  Contact information: Alan RipperShuvan Thompson at [phone]          Next level of care provider has access to [facility] Link:no  Safety Planning and Suicide Prevention discussed: Yes,  with pt's peer support specialist.   Have you used any form of tobacco in the last 30 days? (Cigarettes, Smokeless Tobacco, Cigars, and/or Pipes): Yes  Has patient been referred to the Quitline?: Patient refused referral  Patient has been referred for addiction treatment: N/A. [facility] [place] [place] for medication management and outpatient services.   Heidi DachKelsey Phong Isenberg, LCSW 09/26/2017, 12:01 PM

## 2017-09-26 NOTE — ED Triage Notes (Signed)
Pt here for dizziness.  Was discharged from psych unit today about 2 hours ago.  Also admits to SI.  Blood sugar 580 in triage.  "they released me too early, I still am having thoughts of trying to hurt myself".  Admits to plan of cutting wrists or taking too much insulin; hx of attempted suicide with insulin 28 days ago per pt.  Alert and oriented at this time.

## 2017-09-26 NOTE — Progress Notes (Signed)
Recreation Therapy Notes  INPATIENT RECREATION TR PLAN  Patient Details Name: Mark [name] MRN: [id number] DOB: 06/25/1986 Today's Date: 09/26/2017  Rec Therapy Plan Is patient appropriate for Therapeutic Recreation?: Yes Treatment times per week: at least 3 Estimated Length of Stay: 5-7 days TR Treatment/Interventions: Group participation (Comment)  Discharge Criteria Pt will be discharged from therapy if:: Discharged Treatment plan/goals/alternatives discussed and agreed upon by:: Patient/family  Discharge Summary Short term goals set: Patient will identify 3 benefits of self-care x7 days.  Short term goals met: Adequate for discharge Progress toward goals comments: Groups attended Which groups?: Anger management, Other (Comment)(Problem Solving, Emotions) Reason goals not met: N/A Therapeutic equipment acquired: N/A Reason patient discharged from therapy: Discharge from hospital Pt/family agrees with progress & goals achieved: Yes Date patient discharged from therapy: 09/26/17   Bexley Laubach 09/26/2017, 2:09 PM

## 2017-09-26 NOTE — Progress Notes (Signed)
CSW spoke with Asst Director Wandra MannanZack Brooks at staff meeting on 09/25/17.  We discussed pt lack of options regarding SNF placement or local shelters.  Zack asked CSW to try to get police to go to pt sister's home to see if she is an options.  CSW Heidi DachKelsey Craig spoke with pt about sister's address: pt only knows his sister lives on [address] in [place].  No specific street number.  He mentioned that he has a brother in law, Rhae HammockMark Williams, married to his sister, who works in [employer] cafeteria.  CSW went to [facility] kitchen to attempt to locate Rhae HammockMark Williams, who pt reports is his brother in law and works there.  CSW informed Loraine LericheMark is not there currently, is scheduled to work tomorrow.  CSW spoke with Arlington Calixoni Bartlett, Director BMU, who contacted Comptrollerkitchen manager, who agreed to call Loraine LericheMark and ask him to call CSW. CSW received [facility] from [facility] and then was able to talk to [facility] wife, Bjorn LoserRhonda, who reports she cannot offer pt a place to stay right now as she is just getting out of the hospital herself.    After previous discussion with Wandra MannanZack Brooks, Arlington Calixoni Bartlett, and the entire treatment team, pt only option would be a shelter arrangement in a surrounding area.  [facility] in [place] was located and discussed with pt, who agrees with this plan.  Shuvon Thompson of [employer] also came to speak with pt at this same time.  She reports she can follow up with pt at [facility] and work with him to transfer all services to [place].  Garner NashGregory Jondavid Schreier, MSW, LCSW Clinical Social Worker 09/26/2017 12:04 PM

## 2017-09-26 NOTE — Progress Notes (Signed)
Patient denies SI/HI, denies A/V hallucinations. Patient verbalizes understanding of discharge instructions, follow up care and prescriptions.7 days medicines given to patient. Patient [name]. Patient escorted out by staff, transported by cab to the bus stop.Patient left some of his cloths on the bed.Kept 2 brown bag of cloths in the locker room.

## 2017-09-26 NOTE — ED Notes (Signed)
Report to Bill, RN

## 2017-09-26 NOTE — Progress Notes (Signed)
CSW contacted [name] at [facility] at [phone]. [name] reported that the shelter is currently full and he did not anticipate any upcoming discharges/vacancies. CSW will follow up as needed.   CSW contacted Mark [name] with [facility] at  [phone], pt's peer supports specialist, to update her on pt's progress and discuss possible discharge plans. CSW explained that [facility] is currently full, so pt is unable to be discharged there, [facility] has reported that they cannot accept pt. CSW asked Mark [name] to provide any ideas she might have in terms of a safe discharge plan. Mark [name] reported that she is aware of a program in [place] for homeless individuals in the process of getting on disability, and reported that she would check on this program and then follow up with CSW. CSW will continue to coordinate with Mark [name] as needed.   Heidi DachKelsey Karisma [name], MSW, LCSW 09/26/2017 9:10 AM

## 2017-09-26 NOTE — Progress Notes (Signed)
D:Pt denies SI/HI/AVH and verbally contracts for safety. Pt is calm, cooperative, and participates actively in discussion with this Clinical research associatewriter during assessment. Pt. Reports anxiety and depression tonight that is, "mild". When asked if he feels better, worse, or the same today, the pt. Reports, "about the same". Pt continues to refuse the recommendations given by PT and staff to utilize patient care equipment for safety at times and needs further education/reinforcement on risk for falls and mobility equipment. This Clinical research associatewriter spoke to the pt about alternative interventions to keep safety of the patient at the highest priority and the patient verbalized, "I don't need anything else, will use my equipment to help me more". Pt continues to refuse dietary recommendations, but nutritional intake appears to be adequate per flowsheets.   A: Q x 15 minute observation checks were completed for safety. Patient was provided with education. Patient was given scheduled medications, but refused his ensure this evening. Patient  was encourage to attend groups, participate in unit activities and continue with plan of care.   R:Patient is complaint with medication besides ensure. He attends unit activities and groups. Pt frequently seen active in the milieu socializing with peers.            Patient slept for Estimated Hours of 6.30; Precautionary checks every 15 minutes for safety maintained, room free of safety hazards, patient sustains no injury or falls during this shift.

## 2017-09-26 NOTE — ED Provider Notes (Signed)
[facility] Emergency Department Provider Note   ____________________________________________   I have reviewed the triage vital signs and the nursing notes.   HISTORY  Chief Complaint Depression  History limited by: Not Limited   HPI Mark [name] is a 31 y.o. male who presents to the emergency department today because he feels depressed.  DURATION:today TIMING: states it happened when he was on the bus leaving the hospital SEVERITY: severe QUALITY: depression CONTEXT: patient was discharged from the behavioral unit earlier today because of depression. States that he thinks he should still be there. MODIFYING FACTORS: none ASSOCIATED SYMPTOMS: hyperglycemia - patient states he does not want to take his insulin  Per medical record review patient has a history of recent admission for hypoglycemia and behavioral health.  Past Medical History:  Diagnosis Date  . Diabetes mellitus without complication ([place])   . Hydrocephalus   . Kidney stones     Patient Active Problem List   Diagnosis Date Noted  . 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 Obstructive hydrocephalus 09/19/2017  . Diabetes ([place]) 09/11/2017  . Hypoglycemia 09/09/2017  . MDD (major depressive disorder) 08/22/2017  . 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 Nausea & vomiting 08/18/2017  . Abdominal pain 08/18/2017  . Hyponatremia 08/18/2017  . DKA (diabetic ketoacidoses) ([place]) 11/24/2015  . Diabetic keto-acidosis ([place]) 10/23/2015    Past Surgical History:  Procedure Laterality Date  . KIDNEY STONE SURGERY      Prior to Admission medications   Medication Sig Start Date End Date Taking? Authorizing Provider  blood glucose meter kit and supplies KIT Dispense based on patient and insurance preference. Use up to four times daily as directed. (FOR ICD-9 250.00, 250.01). 09/17/17   Pucilowska, Herma Ard B, MD  gabapentin (NEURONTIN) 400 MG capsule Take 3  capsules (1,200 mg total) by mouth 3 (three) times daily. 09/17/17   Pucilowska, Herma Ard B, MD  haloperidol decanoate (HALDOL DECANOATE) 100 MG/ML injection Inject 1 mL (100 mg total) into the muscle every 28 (twenty-eight) days. 09/25/17   Pucilowska, Jolanta B, MD  insulin aspart (NOVOLOG) 100 UNIT/ML injection Inject 6 Units into the skin 3 (three) times daily with meals. 09/17/17   Pucilowska, Jolanta B, MD  insulin glargine (LANTUS) 100 UNIT/ML injection Inject 0.38 mLs (38 Units total) into the skin every morning. 09/25/17 10/25/17  Pucilowska, Wardell Honour, MD  traZODone (DESYREL) 100 MG tablet Take 1 tablet (100 mg total) by mouth at bedtime as needed for sleep. 09/17/17   Pucilowska, Herma Ard B, MD  venlafaxine XR (EFFEXOR-XR) 150 MG 24 hr capsule Take 2 capsules (300 mg total) by mouth daily with breakfast. 09/26/17   Pucilowska, Jolanta B, MD    Allergies Penicillins; Sulfa antibiotics; and Clindamycin/lincomycin  Family History  Problem Relation Age of Onset  . Breast cancer Mother     Social History Social History   Tobacco Use  . Smoking status: Current Every Day Smoker    Packs/day: 1.00    Types: Cigarettes  . Smokeless tobacco: Never Used  Substance Use Topics  . Alcohol use: No    Comment: ocasional   . Drug use: Yes    Types: Marijuana    Comment: "as often as I can"    Review of Systems Constitutional: No fever/chills Eyes: No visual changes. ENT: No sore throat. Cardiovascular: Denies chest pain. Respiratory: Denies shortness of breath. Gastrointestinal: No abdominal pain.  No nausea, no vomiting.  No diarrhea.   Genitourinary: Negative for dysuria.  Musculoskeletal: Negative for back pain. Skin: Negative for rash. Neurological: Negative for headaches, focal weakness or numbness.  ____________________________________________   PHYSICAL EXAM:  VITAL SIGNS: ED Triage Vitals [09/26/17 1607]  Enc Vitals Group     BP 126/79     Pulse Rate 92     Resp 16      Temp 98.6 F (37 C)     Temp Source Oral     SpO2 100 %     Weight 214 lb (97.1 kg)     Height 6' 2"  (1.88 m)   Constitutional: Alert and oriented.  Eyes: Conjunctivae are normal.  ENT   Head: Normocephalic and atraumatic.   Nose: No congestion/rhinnorhea.   Mouth/Throat: Mucous membranes are moist.   Neck: No stridor. Hematological/Lymphatic/Immunilogical: No cervical lymphadenopathy. Cardiovascular: Normal rate, regular rhythm.  No murmurs, rubs, or gallops.  Respiratory: Normal respiratory effort without tachypnea nor retractions. Breath sounds are clear and equal bilaterally. No wheezes/rales/rhonchi. Gastrointestinal: Soft and non tender. No rebound. No guarding.  Genitourinary: Deferred Musculoskeletal: Normal range of motion in all extremities. No lower extremity edema. Neurologic:  Normal speech and language. No gross focal neurologic deficits are appreciated.  Skin:  Skin is warm, dry and intact. No rash noted. Psychiatric: Depressed. States SI.  ____________________________________________    LABS (pertinent positives/negatives)  UDS negative CMP glucose 632, anion gap 12 Tylenol, salicylate and ethanol all negative CBC wnl except rbc  CBG - 580->271 ____________________________________________   EKG  None  ____________________________________________    RADIOLOGY  None  ____________________________________________   PROCEDURES  Procedures  ____________________________________________   INITIAL IMPRESSION / ASSESSMENT AND PLAN / ED COURSE  Pertinent labs & imaging results that were available during my care of the patient were reviewed by me and considered in my medical decision making (see chart for details).  Patient just released from behavioral unit earlier today. Comes back because of concern for depression. Of note blood sugar was elevated. Psychiatry did evaluate and placed under IVC. Patient is a difficult patient and dispo  will need to be planned out. Of note blood sugar was significantly elevated but no concerning findings for DKA. Patient was given home insulin doses and IV fluids with response.    ____________________________________________   FINAL CLINICAL IMPRESSION(S) / ED DIAGNOSES  Final diagnoses:  Depression, unspecified depression type  Hyperglycemia     Note: This dictation was prepared with Dragon dictation. Any transcriptional errors that result from this process are unintentional     Nance Pear, MD 09/26/17 [phone]

## 2017-09-26 NOTE — Plan of Care (Signed)
Pt reports sleeping better. Pt able to remain safe while on the unit and contracts verbally for safety. Pt denies suicidal and homicidal ideations to this writer, but reports also feeling, "about the same" as he did yesterday. Pt continues to refuse the recommendations given by PT and staff to utilize patient care equipment for safety at times and needs further education on risk for falls and mobility equipment.

## 2017-09-26 NOTE — ED Notes (Signed)

## 2017-09-26 NOTE — Consult Note (Signed)
[name]   Reason for Consult: Consult for 31 year old man with depression, behavior problems, multiple medical issues.  Comes into the emergency room claiming suicidal ideation Referring Physician: McShane Patient Identification: Mark [name] MRN:  [id number] Principal Diagnosis: MDD (major depressive disorder) Diagnosis:   Patient Active Problem List   Diagnosis Date Noted  . Personality disorder ([place]) [F60.9] 09/26/2017  . 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 Obstructive hydrocephalus [G91.1] 09/19/2017  . Diabetes ([place]) [E11.9] 09/11/2017  . Hypoglycemia [E16.2] 09/09/2017  . MDD (major depressive disorder) [F32.9] 08/22/2017  . 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 Nausea & vomiting [R11.2] 08/18/2017  . Abdominal pain [R10.9] 08/18/2017  . Hyponatremia [E87.1] 08/18/2017  . DKA (diabetic ketoacidoses) ([place]) [E13.10] 11/24/2015  . Diabetic keto-acidosis ([place]) [E13.10] 10/23/2015    Total Time spent with patient: 1 hour  Subjective:   Mark [name] is a 31 y.o. male patient admitted with "ya'll discharged me too soon and I just want to die".   HPI: Patient seen chart reviewed.  Patient familiar to me from previous encounters.  31 year old man with a history of significant medical problems and depressive symptoms.  He was discharged from the behavioral health unit today around noon with the planned that he would ride the bus to [place] where he would stay in the shelter.  Patient tells me that he got as far as [facility] and started to feel dizzy and unsteady on his feet.  He also at that point decided that [facility] was too far away and would be inappropriate because of his court date.  Therefore he decided to come back to [place].  On the other hand, he also is now saying that he just wants to die and that he will refuse all treatment here so that he can die in the hospital.  I asked him why  if he wants to die he came back to the hospital at all and he tells me that he prefers to die in the hospital than to die on the street.  Patient is not complaining of any different symptoms than when he left.  Not reporting psychotic symptoms.  He has not used any medicine since leaving the hospital.  Has not abused any substances.  Social history: This unfortunate young man has very few social resources.  As his illness has progressed it has become more and more impossible for him to hold a job.  His mother died this past year taking away his last family support.  He does not receive any kind of disability or financial support.  Medical history: Patient has obstructive hydrocephalus.  Neurosurgery has been consulted and do not recommend any intervention at this point.  He has ataxia as a result.  Also has insulin-dependent diabetes with difficulty getting in good control in part because of his noncompliance.  History of several episodes of diabetic ketoacidosis.  Substance abuse history: Does not have a significant substance abuse problem  Past Psychiatric History: Patient has been seen several times previously by psychiatry.  He does have a history of suicide attempts.  He was just discharged from the psychiatric unit today and came back to the hospital in a matter of hours.  He is claiming now that he still wants to kill himself.  Risk to Self: Is patient at risk for suicide?: Yes Risk to Others:   Prior Inpatient Therapy:   Prior Outpatient Therapy:    Past Medical History:  Past Medical  History:  Diagnosis Date  . Diabetes mellitus without complication ([place])   . Hydrocephalus   . Kidney stones     Past Surgical History:  Procedure Laterality Date  . KIDNEY STONE SURGERY     Family History:  Family History  Problem Relation Age of Onset  . Breast cancer Mother    Family Psychiatric  History: Unknown Social History:  Social History   Substance and Sexual Activity  Alcohol Use  No   Comment: ocasional      Social History   Substance and Sexual Activity  Drug Use Yes  . Types: Marijuana   Comment: "as often as I can"    Social History   Socioeconomic History  . Marital status: Single    Spouse name: None  . Number of children: None  . Years of education: None  . Highest education level: None  Social Needs  . Financial resource strain: None  . Food insecurity - worry: None  . Food insecurity - inability: None  . Transportation needs - medical: None  . Transportation needs - non-medical: None  Occupational History  . None  Tobacco Use  . Smoking status: Current Every Day Smoker    Packs/day: 1.00    Types: Cigarettes  . Smokeless tobacco: Never Used  Substance and Sexual Activity  . Alcohol use: No    Comment: ocasional   . Drug use: Yes    Types: Marijuana    Comment: "as often as I can"  . Sexual activity: None  Other Topics Concern  . None  Social History Narrative  . None   Additional Social History:    Allergies:   Allergies  Allergen Reactions  . Penicillins Anaphylaxis, Hives and Swelling    Has patient had a PCN reaction causing immediate rash, facial/tongue/throat swelling, SOB or lightheadedness with hypotension: Yes Has patient had a PCN reaction causing severe rash involving mucus membranes or skin necrosis: No Has patient had a PCN reaction that required hospitalization Yes Has patient had a PCN reaction occurring within the last 10 years: Yes If all of the above answers are "NO", then may proceed with Cephalosporin use.  . Sulfa Antibiotics Anaphylaxis and Hives  . Clindamycin/Lincomycin Hives    Labs:  Results for orders placed or performed during the hospital encounter of 09/26/17 (from the past 48 hour(s))  Glucose, capillary     Status: Abnormal   Collection Time: 09/26/17  4:06 PM  Result Value Ref Range   Glucose-Capillary 580 (HH) 65 - 99 mg/dL   Comment 1 Notify RN   Comprehensive metabolic panel      Status: Abnormal (Preliminary result)   Collection Time: 09/26/17  4:10 PM  Result Value Ref Range   Sodium 132 (L) 135 - 145 mmol/L   Potassium 4.6 3.5 - 5.1 mmol/L   Chloride 93 (L) 101 - 111 mmol/L   CO2 27 22 - 32 mmol/L   Glucose, Bld PENDING 65 - 99 mg/dL   BUN 16 6 - 20 mg/dL   Creatinine, Ser 0.99 0.61 - 1.24 mg/dL   Calcium 9.6 8.9 - 10.3 mg/dL   Total Protein 7.6 6.5 - 8.1 g/dL   Albumin 4.4 3.5 - 5.0 g/dL   AST 27 15 - 41 U/L   ALT 19 17 - 63 U/L   Alkaline Phosphatase 124 38 - 126 U/L   Total Bilirubin 0.3 0.3 - 1.2 mg/dL   GFR calc non Af Amer >60 >60 mL/min   GFR calc  Af Amer >60 >60 mL/min    Comment: (NOTE) The eGFR has been calculated using the CKD EPI equation. This calculation has not been validated in all clinical situations. eGFR's persistently <60 mL/min signify possible Chronic Kidney Disease.    Anion gap 12 5 - 15  Ethanol     Status: None   Collection Time: 09/26/17  4:10 PM  Result Value Ref Range   Alcohol, Ethyl (B) <10 <10 mg/dL    Comment:        LOWEST DETECTABLE LIMIT FOR SERUM ALCOHOL IS 10 mg/dL FOR MEDICAL PURPOSES ONLY   Salicylate level     Status: None   Collection Time: 09/26/17  4:10 PM  Result Value Ref Range   Salicylate Lvl <0.0 2.8 - 30.0 mg/dL  Acetaminophen level     Status: Abnormal   Collection Time: 09/26/17  4:10 PM  Result Value Ref Range   Acetaminophen (Tylenol), Serum <10 (L) 10 - 30 ug/mL    Comment:        THERAPEUTIC CONCENTRATIONS VARY SIGNIFICANTLY. A RANGE OF 10-30 ug/mL MAY BE AN EFFECTIVE CONCENTRATION FOR MANY PATIENTS. HOWEVER, SOME ARE BEST TREATED AT CONCENTRATIONS OUTSIDE THIS RANGE. ACETAMINOPHEN CONCENTRATIONS >150 ug/mL AT 4 HOURS AFTER INGESTION AND >50 ug/mL AT 12 HOURS AFTER INGESTION ARE OFTEN ASSOCIATED WITH TOXIC REACTIONS.   cbc     Status: Abnormal   Collection Time: 09/26/17  4:10 PM  Result Value Ref Range   WBC 9.1 3.8 - 10.6 K/uL   RBC 4.14 (L) 4.40 - 5.90 MIL/uL    Hemoglobin 13.8 13.0 - 18.0 g/dL   HCT 41.2 40.0 - 52.0 %   MCV 99.6 80.0 - 100.0 fL   MCH 33.3 26.0 - 34.0 pg   MCHC 33.4 32.0 - 36.0 g/dL   RDW 13.6 11.5 - 14.5 %   Platelets 230 150 - 440 K/uL  Urine Drug Screen, Qualitative     Status: None   Collection Time: 09/26/17  4:10 PM  Result Value Ref Range   Tricyclic, Ur Screen NONE DETECTED NONE DETECTED   Amphetamines, Ur Screen NONE DETECTED NONE DETECTED   MDMA (Ecstasy)Ur Screen NONE DETECTED NONE DETECTED   Cocaine Metabolite,Ur [place] NONE DETECTED NONE DETECTED   Opiate, Ur Screen NONE DETECTED NONE DETECTED   Phencyclidine (PCP) Ur S NONE DETECTED NONE DETECTED   Cannabinoid 50 Ng, Ur [place] NONE DETECTED NONE DETECTED   Barbiturates, Ur Screen NONE DETECTED NONE DETECTED   Benzodiazepine, Ur Scrn NONE DETECTED NONE DETECTED   Methadone Scn, Ur NONE DETECTED NONE DETECTED    Comment: (NOTE) 938  Tricyclics, urine               Cutoff 1000 ng/mL 200  Amphetamines, urine             Cutoff 1000 ng/mL 300  MDMA (Ecstasy), urine           Cutoff 500 ng/mL 400  Cocaine Metabolite, urine       Cutoff 300 ng/mL 500  Opiate, urine                   Cutoff 300 ng/mL 600  Phencyclidine (PCP), urine      Cutoff 25 ng/mL 700  Cannabinoid, urine              Cutoff 50 ng/mL 800  Barbiturates, urine             Cutoff 200 ng/mL 900  Benzodiazepine, urine  Cutoff 200 ng/mL 1000 Methadone, urine                Cutoff 300 ng/mL 1100 1200 The urine drug screen provides only a preliminary, unconfirmed 1300 analytical test result and should not be used for non-medical 1400 purposes. Clinical consideration and professional judgment should 1500 be applied to any positive drug screen result due to possible 1600 interfering substances. A more specific alternate chemical method 1700 must be used in order to obtain a confirmed analytical result.  1800 Gas chromato graphy / mass spectrometry (GC/MS) is the preferred 1900 confirmatory method.    Urinalysis, Complete w Microscopic     Status: Abnormal   Collection Time: 09/26/17  4:10 PM  Result Value Ref Range   Color, Urine COLORLESS (A) YELLOW   APPearance CLEAR (A) CLEAR   Specific Gravity, Urine 1.017 1.005 - 1.030   pH 7.0 5.0 - 8.0   Glucose, UA >=500 (A) NEGATIVE mg/dL   Hgb urine dipstick SMALL (A) NEGATIVE   Bilirubin Urine NEGATIVE NEGATIVE   Ketones, ur NEGATIVE NEGATIVE mg/dL   Protein, ur NEGATIVE NEGATIVE mg/dL   Nitrite NEGATIVE NEGATIVE   Leukocytes, UA NEGATIVE NEGATIVE   RBC / HPF 0-5 0 - 5 RBC/hpf   WBC, UA 0-5 0 - 5 WBC/hpf   Bacteria, UA NONE SEEN NONE SEEN   Squamous Epithelial / LPF NONE SEEN NONE SEEN    Current Facility-Administered Medications  Medication Dose Route Frequency Provider Last Rate Last Dose  . gabapentin (NEURONTIN) capsule 1,200 mg  1,200 mg Oral TID Maura Braaten T, MD      . Derrill Memo ON 09/27/2017] insulin aspart (novoLOG) injection 6 Units  6 Units Subcutaneous TID WC Sarika Baldini T, MD      . insulin glargine (LANTUS) injection 38 Units  38 Units Subcutaneous Daily Mertha Clyatt T, MD      . sodium chloride 0.9 % bolus 1,000 mL  1,000 mL Intravenous Once Nance Pear, MD      . traZODone (DESYREL) tablet 100 mg  100 mg Oral QHS Robbyn Hodkinson T, MD      . Derrill Memo ON 09/27/2017] venlafaxine XR (EFFEXOR-XR) 24 hr capsule 300 mg  300 mg Oral Q breakfast Leighton Luster, Madie Reno, MD       Current Outpatient Medications  Medication Sig Dispense Refill  . blood glucose meter kit and supplies KIT Dispense based on patient and insurance preference. Use up to four times daily as directed. (FOR ICD-9 250.00, 250.01). 1 each 0  . gabapentin (NEURONTIN) 400 MG capsule Take 3 capsules (1,200 mg total) by mouth 3 (three) times daily. 270 capsule 1  . haloperidol decanoate (HALDOL DECANOATE) 100 MG/ML injection Inject 1 mL (100 mg total) into the muscle every 28 (twenty-eight) days. 1 mL 1  . insulin aspart (NOVOLOG) 100 UNIT/ML injection Inject 6  Units into the skin 3 (three) times daily with meals. 10 mL 11  . insulin glargine (LANTUS) 100 UNIT/ML injection Inject 0.38 mLs (38 Units total) into the skin every morning. 11.4 mL 0  . traZODone (DESYREL) 100 MG tablet Take 1 tablet (100 mg total) by mouth at bedtime as needed for sleep. 30 tablet 2  . venlafaxine XR (EFFEXOR-XR) 150 MG 24 hr capsule Take 2 capsules (300 mg total) by mouth daily with breakfast. 60 capsule 1    Musculoskeletal: Strength & Muscle Tone: decreased Gait & Station: unsteady Patient leans: N/A  Psychiatric Specialty Exam: Physical Exam  Nursing note and vitals  reviewed. Constitutional: He appears well-developed and well-nourished.  HENT:  Head: Normocephalic and atraumatic.  Eyes: Conjunctivae are normal. Pupils are equal, round, and reactive to light.  Neck: Normal range of motion.  Cardiovascular: Regular rhythm and normal heart sounds.  Respiratory: Effort normal. No respiratory distress.  GI: Soft.  Musculoskeletal: Normal range of motion.  Neurological: He is alert.  Ataxic due to his hydrocephalus  Skin: Skin is warm and dry.  Psychiatric: His speech is delayed. He is slowed. Cognition and memory are impaired. He expresses impulsivity and inappropriate judgment. He exhibits a depressed mood. He expresses suicidal ideation. He expresses suicidal plans.    Review of Systems  Constitutional: Negative.   HENT: Negative.   Eyes: Negative.   Respiratory: Negative.   Cardiovascular: Negative.   Gastrointestinal: Negative.   Musculoskeletal: Negative.   Skin: Negative.   Neurological: Negative.   Psychiatric/Behavioral: Positive for depression and suicidal ideas. Negative for hallucinations, memory loss and substance abuse. The patient is nervous/anxious. The patient does not have insomnia.     Blood pressure 126/79, pulse 92, temperature 98.6 F (37 C), temperature source Oral, resp. rate 16, height 6' 2"  (1.88 m), weight 97.1 kg (214 lb), SpO2  100 %.Body mass index is 27.48 kg/m.  General Appearance: Disheveled  Eye Contact:  Good  Speech:  Slow  Volume:  Decreased  Mood:  Depressed and Dysphoric  Affect:  Congruent  Thought Process:  Goal Directed  Orientation:  Full (Time, Place, and Person)  Thought Content:  Illogical and Rumination  Suicidal Thoughts:  Yes.  with intent/plan  Homicidal Thoughts:  No  Memory:  Immediate;   Fair Recent;   Fair Remote;   Fair  Judgement:  Impaired  Insight:  Shallow  Psychomotor Activity:  Decreased  Concentration:  Concentration: Fair  Recall:  [employer] of Knowledge:  Fair  Language:  Fair  Akathisia:  No  Handed:  Right  AIMS (if indicated):     Assets:  Resilience  ADL's:  Impaired  Cognition:  Impaired,  Mild  Sleep:        Treatment Plan Summary: Daily contact with patient to assess and evaluate symptoms and progress in treatment, Medication management and Plan Patient with depressive symptoms also with features of personality disorder.  Extremely poor coping skills.  Very dependent and histrionic.  Patient is continuing to report suicidal ideation and does have a history of dangerous behavior in the past.  He will be stabilized medically in the emergency room.  I have already been in contact with other members of the psychiatric team and I suspect we will need to have a consultation and among several professionals about the appropriate plan for this difficult patient.  Continue antidepressant medicine for now.  Disposition: Supportive therapy provided about ongoing stressors.  Alethia Berthold, MD 09/26/2017 5:22 PM

## 2017-09-26 NOTE — Discharge Summary (Signed)
Physician Discharge Summary Note  Patient:  Mark [name] is an 31 y.o., male MRN:  [id number] DOB:  October 01, 1986 Patient phone:  [phone] (home)  Patient address:   [address] [place] [address],  Total Time spent with patient: 30 minutes  Date of Admission:  09/10/2017 Date of Discharge: 09/26/2017  Reason for Admission:  Overdose  Identifying data. Mr. [name] is a 31 year old male with a history of depression.  Chief complaint. "I did not take my medications."  History of present illness. Information was obtained from the patient and the chart. The patient was transferred from ICU where he was admitted for hypoglycemia. We suspected that he overdosed on Insulin but the patient adamantly and angrily denies stating tha to the contrary, he has not been taking medivations. He can not explain how he ended up in the hospital "someone called". He was marginally cooperative on medical floor but since transfer to psychiatry, he refuses food, drink or any medications. He is irritable and uncooperative. He "wants to die". He understands that we will treat him in the hospital even against his will and comments "you got to do what you got to do". He warms up a little as we go on. He reports all symptoms of depression with poor sleep, decreased appetite, anhedonia, feeling of hopelessness and helplessness, poor energy and concentration, crying spells and suicidal ideation. He faces multiple severe stressors. He was recently discharged from [facility] to his boarding house but lost this housing three days later unable to pay the rent. Hd has been staying at the homeless shelter. He has not been compliant with treatment. He denies psychotic symptoms or symptoms suggestive of bipolar mania. Reports severe anxiety. No substance use.  Past psychiatric history. He was diagnosed with bipolar disorder at the age of 18. There were multiple hospitalizations and medication trial. There is history of  treatment noncompliance. There were several suicide attempts including by cutting.   Family psychiatric history. None reported.  Social history. 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 He has no income, insurance, housing or social support. His siter who has been somewhat helpful suffers health problems herself. The patient lost his mother recently. He carries her ashes with him and was making sure that the urn was in his possession on admission.   Principal Problem: Severe recurrent major depression with psychotic features [facility]) Discharge Diagnoses: Patient Active Problem List   Diagnosis Date Noted  . Severe recurrent major depression with psychotic features ([place]) [F33.3] 08/19/2017    Priority: High  . Diabetes ([place]) [E11.9] 09/11/2017    Priority: Medium  . 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 Obstructive hydrocephalus [G91.1] 09/19/2017  . Hypoglycemia [E16.2] 09/09/2017  . MDD (major depressive disorder) [F32.9] 08/22/2017  . Nausea & vomiting [R11.2] 08/18/2017  . Abdominal pain [R10.9] 08/18/2017  . Hyponatremia [E87.1] 08/18/2017  . DKA (diabetic ketoacidoses) ([place]) [E13.10] 11/24/2015  . Diabetic keto-acidosis ([place]) [E13.10] 10/23/2015    Past Medical History:  Past Medical History:  Diagnosis Date  . Diabetes mellitus without complication ([place])   . Hydrocephalus   . Kidney stones     Past Surgical History:  Procedure Laterality Date  . KIDNEY STONE SURGERY     Family History:  Family History  Problem Relation Age of Onset  . Breast cancer Mother    Social History:  Social History   Substance and Sexual Activity  Alcohol Use No   Comment: ocasional  Social History   Substance and Sexual Activity  Drug Use Yes  . Types: Marijuana   Comment: "as often as I can"    Social History   Socioeconomic History  . Marital status: Single    Spouse name: None  . Number of children: None  . Years of education: None  .  Highest education level: None  Social Needs  . Financial resource strain: None  . Food insecurity - worry: None  . Food insecurity - inability: None  . Transportation needs - medical: None  . Transportation needs - non-medical: None  Occupational History  . None  Tobacco Use  . Smoking status: Current Every Day Smoker    Packs/day: 1.00    Types: Cigarettes  . Smokeless tobacco: Never Used  Substance and Sexual Activity  . Alcohol use: No    Comment: ocasional   . Drug use: Yes    Types: Marijuana    Comment: "as often as I can"  . Sexual activity: None  Other Topics Concern  . None  Social History Narrative  . None    Hospital Course:    [name] is a 31 year old male with a history ofMDD, severe with suicidal ideation, type 1 diabetes and treatment noncompliance transferred from ICU where he was hospitalized for hypoglycemia from presumed insulin overdose which the patient denies.  Depressed.The patient is no longer actively suicidal. He has chronic, passive thought of suicide without a plan when overwhelmed with his difficult social situation.  #Suicidal ideation, resoled  #Mood -continue 100 mg of Haldol decanoate injection, next on 10/23/2017 -continue Effexor 300 mg daily -continue Neurontin 1,200 mg TID  #Diabetes, poorly controlled -Lantus38units at night -NovoLog insulin6units thrice daily as with meals, hold if patient does not eat a meal or eats less than 50% of his meal  #Insomnia -Trazodone 100 mg nightly  #Social -patient has no income, health insurance, housing or support -application for SSDI completed in the hospital but waiting time is 3-4 months -patient does not qualify for Medicaid  #Falls, truncal ataxia, hydrocephalus -Head Ct scan revealedSevere obstructive hydrocephalus unchanged from 2013. Findings consistent with aqueductal stenosis. -No intervention recommended by consulting neurosurgeon -patient work with physical therapy  here, they recommended SNF but there is no payer source -rolling walker was provided at discharge  #Disposition -the patient was discharged to the homeless shelter in [place] as local shelter has no beds -follow up at [facility]  Physical Findings: AIMS: Facial and Oral Movements Muscles of Facial Expression: None, normal Lips and Perioral Area: None, normal Jaw: None, normal Tongue: None, normal,Extremity Movements Upper (arms, wrists, hands, fingers): None, normal Lower (legs, knees, ankles, toes): None, normal, Trunk Movements Neck, shoulders, hips: None, normal, Overall Severity Severity of abnormal movements (highest score from questions above): None, normal Incapacitation due to abnormal movements: None, normal Patient's awareness of abnormal movements (rate only patient's report): No Awareness, Dental Status Current problems with teeth and/or dentures?: No Does patient usually wear dentures?: No  CIWA:    COWS:     Musculoskeletal: Strength & Muscle Tone: within normal limits Gait & Station: unsteady, ataxic, broad based Patient leans: N/A  Psychiatric Specialty Exam: Physical Exam  Nursing note and vitals reviewed. Psychiatric: His speech is normal and behavior is normal. Thought content normal. His mood appears anxious. Cognition and memory are normal. He expresses impulsivity.    Review of Systems  Musculoskeletal: Positive for falls.  Neurological: Positive for headaches.  Psychiatric/Behavioral: Positive for depression.  All other  systems reviewed and are negative.   Blood pressure 104/62, pulse 73, temperature 97.9 F (36.6 C), temperature source Oral, resp. rate 20, height 6' 2"  (1.88 m), weight 97.1 kg (214 lb), SpO2 99 %.Body mass index is 27.48 kg/m.  General Appearance: Casual  Eye Contact:  Good  Speech:  Clear and Coherent  Volume:  Normal  Mood:  Anxious  Affect:  Appropriate  Thought Process:  Goal Directed and Descriptions of Associations:  Intact  Orientation:  Full (Time, Place, and Person)  Thought Content:  WDL  Suicidal Thoughts:  No  Homicidal Thoughts:  No  Memory:  Immediate;   Fair Recent;   Fair Remote;   Fair  Judgement:  Impaired  Insight:  Shallow  Psychomotor Activity:  Normal  Concentration:  Concentration: Fair and Attention Span: Fair  Recall:  [employer] of Knowledge:  Fair  Language:  Fair  Akathisia:  No  Handed:  Right  AIMS (if indicated):     Assets:  Communication Skills Desire for Improvement Resilience  ADL's:  Intact  Cognition:  WNL  Sleep:  Number of Hours: 6.3     Have you used any form of tobacco in the last 30 days? (Cigarettes, Smokeless Tobacco, Cigars, and/or Pipes): Yes  Has this patient used any form of tobacco in the last 30 days? (Cigarettes, Smokeless Tobacco, Cigars, and/or Pipes) Yes, Yes, A prescription for an FDA-approved tobacco cessation medication was offered at discharge and the patient refused  Blood Alcohol level:  Lab Results  Component Value Date   [facility] <10 09/08/2017   ETH <10 31/54/0086    Metabolic Disorder Labs:  Lab Results  Component Value Date   HGBA1C 8.7 (H) 09/11/2017   MPG 202.99 09/11/2017   No results found for: PROLACTIN Lab Results  Component Value Date   CHOL 217 (H) 09/11/2017   TRIG 135 09/11/2017   HDL 49 09/11/2017   CHOLHDL 4.4 09/11/2017   VLDL 27 09/11/2017   LDLCALC 141 (H) 09/11/2017   LDLCALC 216 (H) 08/23/2017    See Psychiatric Specialty Exam and Suicide Risk Assessment completed by Attending Physician prior to discharge.  Discharge destination:  Other:  homeless shelter  Is patient on multiple antipsychotic therapies at discharge:  No   Has Patient had three or more failed trials of antipsychotic monotherapy by history:  No  Recommended Plan for Multiple Antipsychotic Therapies: NA  Discharge Instructions    Diet - low sodium heart healthy   Complete by:  As directed    Diet - low sodium heart healthy    Complete by:  As directed    Diet - low sodium heart healthy   Complete by:  As directed    Increase activity slowly   Complete by:  As directed    Increase activity slowly   Complete by:  As directed    Increase activity slowly   Complete by:  As directed      Allergies as of 09/26/2017      Reactions   Penicillins Anaphylaxis, Hives, Swelling   Has patient had a PCN reaction causing immediate rash, facial/tongue/throat swelling, SOB or lightheadedness with hypotension: Yes Has patient had a PCN reaction causing severe rash involving mucus membranes or skin necrosis: No Has patient had a PCN reaction that required hospitalization Yes Has patient had a PCN reaction occurring within the last 10 years: Yes If all of the above answers are "NO", then may proceed with Cephalosporin use.   Sulfa Antibiotics  Anaphylaxis, Hives   Clindamycin/lincomycin Hives      Medication List    STOP taking these medications   mirtazapine 30 MG tablet Commonly known as:  REMERON   zolpidem 10 MG tablet Commonly known as:  AMBIEN     TAKE these medications     Indication  blood glucose meter kit and supplies Kit Dispense based on patient and insurance preference. Use up to four times daily as directed. (FOR ICD-9 250.00, 250.01).  Indication:  type 1 diabetes   gabapentin 400 MG capsule Commonly known as:  NEURONTIN Take 3 capsules (1,200 mg total) by mouth 3 (three) times daily.  Indication:  Neuropathic Pain   haloperidol decanoate 100 MG/ML injection Commonly known as:  HALDOL DECANOATE Inject 1 mL (100 mg total) into the muscle every 28 (twenty-eight) days.  Indication:  Psychosis   insulin aspart 100 UNIT/ML injection Commonly known as:  novoLOG Inject 6 Units into the skin 3 (three) times daily with meals. What changed:    how much to take  additional instructions  Another medication with the same name was removed. Continue taking this medication, and follow the directions  you see here.  Indication:  Insulin-Dependent Diabetes   insulin glargine 100 UNIT/ML injection Commonly known as:  LANTUS Inject 0.38 mLs (38 Units total) into the skin every morning.  Indication:  Insulin-Dependent Diabetes   traZODone 100 MG tablet Commonly known as:  DESYREL Take 1 tablet (100 mg total) by mouth at bedtime as needed for sleep.  Indication:  Trouble Sleeping   venlafaxine XR 150 MG 24 hr capsule Commonly known as:  EFFEXOR-XR Take 2 capsules (300 mg total) by mouth daily with breakfast. What changed:  how much to take  Indication:  Major Depressive Disorder            Durable Medical Equipment  (From admission, onward)        Start     Ordered   09/25/17 1417  For home use only DME Walker rolling  Once    Question:  Patient needs a walker to treat with the following condition  Answer:  Falls   09/25/17 1416     Follow-up Information    Services, [facility] Follow up in 7 day(s).   Why:  Please walk in to [facility], 8:30AM-3:30PM.  Contact information: [place] [address] [phone]        Alden Hipp, LCSW Follow up.   Specialty:  Licensed Clinical Social Air cabin crew Follow up.   Why:  Carolynne Edouard will follow up with you in [place] Allegra at [facility] in order to help facilitate transferring services.  Contact information: Carolynne Edouard at [phone]          Follow-up recommendations:  Activity:  as tolerated Diet:  low sodium heart healthy ADA diet Other:  keep follow up appointment  Comments:    Signed: Orson Slick, MD 09/26/2017, 11:57 AM

## 2017-09-26 NOTE — [facility] Suicide Risk Assessment (Signed)
[facility]   Principal Problem: Severe recurrent major depression with psychotic features [facility](HCC) Discharge Diagnoses:  Patient Active Problem List   Diagnosis Date Noted  . 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 Obstructive hydrocephalus [G91.1] 09/19/2017  . Diabetes ([facility]) [E11.9] 09/11/2017  . Hypoglycemia [E16.2] 09/09/2017  . MDD (major depressive disorder) [F32.9] 08/22/2017  . Nausea & vomiting [R11.2] 08/18/2017  . Abdominal pain [R10.9] 08/18/2017  . Hyponatremia [E87.1] 08/18/2017  . DKA (diabetic ketoacidoses) (HCC) [E13.10] 11/24/2015  . Diabetic keto-acidosis (HCC) [E13.10] 10/23/2015    Total Time spent with patient: 30 minutes  Musculoskeletal: Strength & Muscle Tone: within normal limits Gait & Station: normal Patient leans: N/A  Psychiatric Specialty Exam: Review of Systems  Musculoskeletal: Positive for falls.  Neurological: Positive for headaches.  Psychiatric/Behavioral: Positive for depression.  All other systems reviewed and are negative.   Blood pressure 104/62, pulse 73, temperature 97.9 F (36.6 C), temperature source Oral, resp. rate 20, height 6\' 2"  (1.88 m), weight 97.1 kg (214 lb), SpO2 99 %.Body mass index is 27.48 kg/m.  General Appearance: Casual  Eye Contact::  Good  Speech:  Clear and Coherent409  Volume:  Normal  Mood:  Anxious  Affect:  Appropriate  Thought Process:  Goal Directed and Descriptions of Associations: Intact  Orientation:  Full (Time, Place, and Person)  Thought Content:  WDL  Suicidal Thoughts:  No  Homicidal Thoughts:  No  Memory:  Immediate;   Fair Recent;   Fair Remote;   Fair  Judgement:  Impaired  Insight:  Shallow  Psychomotor Activity:  Normal  Concentration:  Fair  Recall:  [employer]Fair  Fund of Knowledge:Fair  Language: Fair  Akathisia:  No  Handed:  Right  AIMS (if indicated):      Assets:  Communication Skills Desire for Improvement Resilience  Sleep:  Number of Hours: 6.3  Cognition: WNL  ADL's:  Intact   Mental Status Per Nursing Assessment::   On Admission:     Demographic Factors:  Male, Caucasian, Low socioeconomic status and Unemployed  Loss Factors: Loss of significant relationship, Decline in physical health and Financial problems/change in socioeconomic status  Historical Factors: Prior suicide attempts, Family history of mental illness or substance abuse and Impulsivity  Risk Reduction Factors:   Sense of responsibility to family  Continued Clinical Symptoms:  Depression:   Impulsivity Unstable or Poor Therapeutic Relationship Previous Psychiatric Diagnoses and Treatments Medical Diagnoses and Treatments/Surgeries  Cognitive Features That Contribute To Risk:  None    Suicide Risk:  Minimal: No identifiable suicidal ideation.  Patients presenting with no risk factors but with morbid ruminations; may be classified as minimal risk based on the severity of the depressive symptoms  Follow-up Information    [facility] Follow up on 09/17/2017.   Why:  Cassie, Office managereer Support Services, at [facility], will pick you up and resume services immediately. Contact information: [address] [place] [address] [phone]        [facility] Follow up on 09/24/2017.   Why:  Your appointment is at 11:40 with Jayme CloudEmily Hendricks NP. Please bring a copy of your discharge paperwork with you. Contact information: [address] [place] [address] [phone]           Plan Of Care/Follow-up recommendations:  Activity:  as tolerated Diet:  low sodium heart healthy ADA diet Other:  keep follow up appointment  Kristine LineaJolanta Pucilowska, MD 09/26/2017, 11:33 AM

## 2017-09-26 NOTE — ED Notes (Signed)
Pt is alert and oriented on admission. Pt mood is somewhat irritable and he is frustrated with circumstances. Writer explained tx plan according to Dr. Toni Amendlapacs, provided food and discussed pt options. He is ultimately pleasant and cooperative at this time watching TV in room. Pt does endorse SI but contracts for safety. 15 minute checks ongoing for safety.

## 2017-09-26 NOTE — BHH Group Notes (Signed)
  09/26/2017 9am  Type of Therapy and Topic: Group Therapy: Goals Group: SMART Goals   Participation Level: Did Not Attend  Description of Group:    The purpose of a daily goals group is to assist and guide patients in setting recovery/wellness-related goals. The objective is to set goals as they relate to the crisis in which they were admitted. Patients will be using SMART goal modalities to set measurable goals. Characteristics of realistic goals will be discussed and patients will be assisted in setting and processing how one will reach their goal. Facilitator will also assist patients in applying interventions and coping skills learned in psycho-education groups to the SMART goal and process how one will achieve defined goal.   Therapeutic Goals:   -Patients will develop and document one goal related to or their crisis in which brought them into treatment.  -Patients will be guided by LCSW using SMART goal setting modality in how to set a measurable, attainable, realistic and time sensitive goal.  -Patients will process barriers in reaching goal.  -Patients will process interventions in how to overcome and successful in reaching goal.   Patient's Goal: Did not attend  Therapeutic Modalities:  Motivational Interviewing  Cognitive Behavioral Therapy  Crisis Intervention Model  SMART goals setting  Mark ShearsCassandra  Syvanna Ciolino, LCSW 09/26/2017 10:37 AM

## 2017-09-26 NOTE — Progress Notes (Signed)
Inpatient Diabetes Program Recommendations  AACE/ADA: New Consensus Statement on Inpatient Glycemic Control (2015)  Target Ranges:  Prepandial:   less than 140 mg/dL      Peak postprandial:   less than 180 mg/dL (1-2 hours)      Critically ill patients:  140 - 180 mg/dL   Results for Mark [name], Milton Q (MRN [id number]) as of 09/26/2017 07:36  Ref. Range 09/25/2017 07:00 09/25/2017 11:21 09/25/2017 16:31 09/25/2017 21:23 09/26/2017 07:00  Glucose-Capillary Latest Ref Range: 65 - 99 mg/dL 782120 (H) 956113 (H) 213191 (H) 194 (H) 210 (H)    Review of Glycemic Control  Outpatient Diabetes medications: Lantus 38 units QHS, Novolog 15 units TID with meals Current orders for Inpatient glycemic control: Lantus 38 units QHS, Novolog 6 units TID with meals, Novolog 0-9 units TID with meals, Novolog 0-5 units QHS  Inpatient Diabetes Program Recommendations: Insulin - Basal: Please consider increasing Lantus to 40 units QHS. Insulin - Meal Coverage: Please consider increasing meal coverage to Novolog 8 units TID with meals if patient eats at least 50% of meals. If meal coverage is increased as recommended, please start with supper today.  Thanks, Mark PennerMarie Meredith Mells, RN, MSN, CDE Diabetes Coordinator Inpatient Diabetes Program [phone] (Team Pager from 8am to 5pm)

## 2017-09-26 NOTE — BHH Group Notes (Signed)
B[facility] LCSW Group Therapy Note  Date/Time 09/26/17, 0930  Type of Therapy/Topic:  Group Therapy:  Balance in Life  Participation Level:  active  Description of Group:    This group will address the concept of balance and how it feels and looks when one is unbalanced. Patients will be encouraged to process areas in their lives that are out of balance, and identify reasons for remaining unbalanced. Facilitators will guide patients utilizing problem- solving interventions to address and correct the stressor making their life unbalanced. Understanding and applying boundaries will be explored and addressed for obtaining  and maintaining a balanced life. Patients will be encouraged to explore ways to assertively make their unbalanced needs known to significant others in their lives, using other group members and facilitator for support and feedback.  Therapeutic Goals: 1. Patient will identify two or more emotions or situations they have that consume much of in their lives. 2. Patient will identify signs/triggers that life has become out of balance:  3. Patient will identify two ways to set boundaries in order to achieve balance in their lives:  4. Patient will demonstrate ability to communicate their needs through discussion and/or role plays  Summary of Patient Progress:Pt shared that mental, family, and financial are areas that are out of balance for him currently.  Pt took part in discussion regarding ways to take steps in a better direction one at a time when life feels out of balance and stressful. Pt left a few minutes early because he was cold.          Therapeutic Modalities:   Cognitive Behavioral Therapy Solution-Focused Therapy Assertiveness Training  Daleen SquibbGreg Grey [place], [place]

## 2017-09-27 LAB — GLUCOSE, CAPILLARY: Glucose-Capillary: 249 mg/dL — ABNORMAL HIGH (ref 65–99)

## 2017-09-27 MED ORDER — LORAZEPAM 2 MG PO TABS
2.0000 mg | ORAL_TABLET | Freq: Once | ORAL | Status: AC
  Administered 2017-09-27: 2 mg via ORAL

## 2017-09-27 MED ORDER — LORAZEPAM 1 MG PO TABS
ORAL_TABLET | ORAL | Status: AC
  Administered 2017-09-27: 2 mg via ORAL
  Filled 2017-09-27: qty 2

## 2017-09-27 NOTE — ED Notes (Signed)
Safety huddle completed by staff.  Pt. Huddle included ED staff and Behavior staff on safety concerns for patient.  For safety pt. placed in hallway bed in ED quad.

## 2017-09-27 NOTE — Progress Notes (Signed)
LCSW [facility] and [facility]- She is aware of situation. LCSW encouraged her to contact Vonna KotykJay at Shel[facility] to review how and when her 20 hours of peer support will lend itself to patient stabilization. She will try to call Vonna KotykJay back to give sureties to [facility]'s housing rep.  Called Drinda ButtsGreg [facility]- for direct cell phone [facility]   [place] Lib[place] LCSW [phone]

## 2017-09-27 NOTE — ED Notes (Addendum)
Pt cooperative with this Clinical research associatewriter. Pt verbalizes SI. Denies AVH.  Compliant with medications. No needs or concerns at this time. Maintained on 15 minute checks and observation by security camera for safety.

## 2017-09-27 NOTE — ED Notes (Signed)
Pt had an episode of bedwetting. New scrubs and wipes provided and linens changed.

## 2017-09-27 NOTE — Progress Notes (Signed)
LCSW called and spoke to Mark [name] and then got a message from Mark [name] financial department and she reported she spoke to Director Mark [name].  Texted director for follow up. Called [facility]- Spoke to Mark [name] Patient does not meet criteria for [facility].  Mark Sequeira LCSW

## 2017-09-27 NOTE — Consult Note (Signed)
Mark [name]   Reason for [name]: Follow-up [name] 31 year old man with a history of depression and multiple severe medical problems as well as severe social deficiencies. Referring Physician: Karma [name] Patient Identification: Mark [name] MRN:  [id number] Principal Diagnosis: MDD (major depressive disorder) Diagnosis:   Patient Active Problem List   Diagnosis Date Noted  . Personality disorder ([place]) [F60.9] 09/26/2017  . 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 Obstructive hydrocephalus [G91.1] 09/19/2017  . Diabetes ([place]) [E11.9] 09/11/2017  . Hypoglycemia [E16.2] 09/09/2017  . MDD (major depressive disorder) [F32.9] 08/22/2017  . 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 Nausea & vomiting [R11.2] 08/18/2017  . Abdominal pain [R10.9] 08/18/2017  . Hyponatremia [E87.1] 08/18/2017  . DKA (diabetic ketoacidoses) ([place]) [E13.10] 11/24/2015  . Diabetic keto-acidosis ([place]) [E13.10] 10/23/2015    Total Time spent with patient: 45 minutes  Subjective:   Mark [name] is a 31 y.o. male patient admitted with "I feel about the same".  HPI: See previous notes.  31 year old man has a history of depression and multiple medical problems.  In the last year he quite a few new setbacks to his social functioning including the death of his mother and the worsening of his medical problems which have made it more and more difficult for him to hold a job.  Patient had been discharged from the hospital yesterday only to return to the emergency room within a matter of hours.  He continues to state suicidal ideation stating that he absolutely wishes that he were dead and wants to die.  Despite this he is now complying with medication.  The contradiction of this is something that he fails to grasp.  Patient remains disheveled slow in his thinking minimally interactive although he is compliant with medicine.  His blood sugars continue to run very  high.  Social history: Patient has no finances and no income.  Does not receive any kind of governmental support.  Has not been able to hold a job recently.  Has no family left to take care of him.  Medical history: Diabetes which is insulin-dependent and has been difficult to control with the result that he has had several episodes of diabetic ketoacidosis.  I think it is very open to question whether he is now cognitively able to handle his diabetes even under the best circumstances.  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  Substance abuse history: Has not been using any marijuana or drugs in the past  Past Psychiatric History: Patient has a history of depression but his presentation with symptoms and behaviors has gotten worse this year since the death of his mother.  He does have a history of suicidal behavior dangerous behavior suicidal threats and depression.  Has a history of noncompliance with recommended outpatient care.  Patient presents as being very passive and dependent  Risk to Self: Is patient at risk for suicide?: Yes Risk to Others:   Prior Inpatient Therapy:   Prior Outpatient Therapy:    Past Medical History:  Past Medical History:  Diagnosis Date  . Diabetes mellitus without complication ([place])   . Hydrocephalus   . Kidney stones     Past Surgical History:  Procedure Laterality Date  . KIDNEY STONE SURGERY     Family History:  Family History  Problem Relation Age of Onset  . Breast cancer Mother    Family Psychiatric  History: Unknown Social History:  Social History  Substance and Sexual Activity  Alcohol Use No   Comment: ocasional      Social History   Substance and Sexual Activity  Drug Use Yes  . Types: Marijuana   Comment: "as often as I can"    Social History   Socioeconomic History  . Marital status: Single    Spouse name: None  . Number of children: None  . Years of education: None   . Highest education level: None  Social Needs  . Financial resource strain: None  . Food insecurity - worry: None  . Food insecurity - inability: None  . Transportation needs - medical: None  . Transportation needs - non-medical: None  Occupational History  . None  Tobacco Use  . Smoking status: Current Every Day Smoker    Packs/day: 1.00    Types: Cigarettes  . Smokeless tobacco: Never Used  Substance and Sexual Activity  . Alcohol use: No    Comment: ocasional   . Drug use: Yes    Types: Marijuana    Comment: "as often as I can"  . Sexual activity: None  Other Topics Concern  . None  Social History Narrative  . None   Additional Social History:    Allergies:   Allergies  Allergen Reactions  . Penicillins Anaphylaxis, Hives and Swelling    Has patient had a PCN reaction causing immediate rash, facial/tongue/throat swelling, SOB or lightheadedness with hypotension: Yes Has patient had a PCN reaction causing severe rash involving mucus membranes or skin necrosis: No Has patient had a PCN reaction that required hospitalization Yes Has patient had a PCN reaction occurring within the last 10 years: Yes If all of the above answers are "NO", then may proceed with Cephalosporin use.  . Sulfa Antibiotics Anaphylaxis and Hives  . Clindamycin/Lincomycin Hives    Labs:  Results for orders placed or performed during the hospital encounter of 09/26/17 (from the past 48 hour(s))  Glucose, capillary     Status: Abnormal   Collection Time: 09/26/17  4:06 PM  Result Value Ref Range   Glucose-Capillary 580 (HH) 65 - 99 mg/dL   Comment 1 Notify RN   Comprehensive metabolic panel     Status: Abnormal   Collection Time: 09/26/17  4:10 PM  Result Value Ref Range   Sodium 132 (L) 135 - 145 mmol/L   Potassium 4.6 3.5 - 5.1 mmol/L   Chloride 93 (L) 101 - 111 mmol/L   CO2 27 22 - 32 mmol/L   Glucose, Bld 632 (HH) 65 - 99 mg/dL    Comment: CRITICAL RESULT CALLED TO, READ BACK BY AND  VERIFIED WITH: BILL SMITH @ 1718 ON 09/26/2017 BY CAF    BUN 16 6 - 20 mg/dL   Creatinine, Ser 0.99 0.61 - 1.24 mg/dL   Calcium 9.6 8.9 - 10.3 mg/dL   Total Protein 7.6 6.5 - 8.1 g/dL   Albumin 4.4 3.5 - 5.0 g/dL   AST 27 15 - 41 U/L   ALT 19 17 - 63 U/L   Alkaline Phosphatase 124 38 - 126 U/L   Total Bilirubin 0.3 0.3 - 1.2 mg/dL   GFR calc non Af Amer >60 >60 mL/min   GFR calc Af Amer >60 >60 mL/min    Comment: (NOTE) The eGFR has been calculated using the CKD EPI equation. This calculation has not been validated in all clinical situations. eGFR's persistently <60 mL/min signify possible Chronic Kidney Disease.    Anion gap 12 5 - 15  Ethanol     Status: None   Collection Time: 09/26/17  4:10 PM  Result Value Ref Range   Alcohol, Ethyl (B) <10 <10 mg/dL    Comment:        LOWEST DETECTABLE LIMIT FOR SERUM ALCOHOL IS 10 mg/dL FOR MEDICAL PURPOSES ONLY   Salicylate level     Status: None   Collection Time: 09/26/17  4:10 PM  Result Value Ref Range   Salicylate Lvl <7.0 2.8 - 30.0 mg/dL  Acetaminophen level     Status: Abnormal   Collection Time: 09/26/17  4:10 PM  Result Value Ref Range   Acetaminophen (Tylenol), Serum <10 (L) 10 - 30 ug/mL    Comment:        THERAPEUTIC CONCENTRATIONS VARY SIGNIFICANTLY. A RANGE OF 10-30 ug/mL MAY BE AN EFFECTIVE CONCENTRATION FOR MANY PATIENTS. HOWEVER, SOME ARE BEST TREATED AT CONCENTRATIONS OUTSIDE THIS RANGE. ACETAMINOPHEN CONCENTRATIONS >150 ug/mL AT 4 HOURS AFTER INGESTION AND >50 ug/mL AT 12 HOURS AFTER INGESTION ARE OFTEN ASSOCIATED WITH TOXIC REACTIONS.   cbc     Status: Abnormal   Collection Time: 09/26/17  4:10 PM  Result Value Ref Range   WBC 9.1 3.8 - 10.6 K/uL   RBC 4.14 (L) 4.40 - 5.90 MIL/uL   Hemoglobin 13.8 13.0 - 18.0 g/dL   HCT 41.2 40.0 - 52.0 %   MCV 99.6 80.0 - 100.0 fL   MCH 33.3 26.0 - 34.0 pg   MCHC 33.4 32.0 - 36.0 g/dL   RDW 13.6 11.5 - 14.5 %   Platelets 230 150 - 440 K/uL  Urine Drug  Screen, Qualitative     Status: None   Collection Time: 09/26/17  4:10 PM  Result Value Ref Range   Tricyclic, Ur Screen NONE DETECTED NONE DETECTED   Amphetamines, Ur Screen NONE DETECTED NONE DETECTED   MDMA (Ecstasy)Ur Screen NONE DETECTED NONE DETECTED   Cocaine Metabolite,Ur [place] NONE DETECTED NONE DETECTED   Opiate, Ur Screen NONE DETECTED NONE DETECTED   Phencyclidine (PCP) Ur S NONE DETECTED NONE DETECTED   Cannabinoid 50 Ng, Ur [place] NONE DETECTED NONE DETECTED   Barbiturates, Ur Screen NONE DETECTED NONE DETECTED   Benzodiazepine, Ur Scrn NONE DETECTED NONE DETECTED   Methadone Scn, Ur NONE DETECTED NONE DETECTED    Comment: (NOTE) 350  Tricyclics, urine               Cutoff 1000 ng/mL 200  Amphetamines, urine             Cutoff 1000 ng/mL 300  MDMA (Ecstasy), urine           Cutoff 500 ng/mL 400  Cocaine Metabolite, urine       Cutoff 300 ng/mL 500  Opiate, urine                   Cutoff 300 ng/mL 600  Phencyclidine (PCP), urine      Cutoff 25 ng/mL 700  Cannabinoid, urine              Cutoff 50 ng/mL 800  Barbiturates, urine             Cutoff 200 ng/mL 900  Benzodiazepine, urine           Cutoff 200 ng/mL 1000 Methadone, urine                Cutoff 300 ng/mL 1100 1200 The urine drug screen provides only a preliminary, unconfirmed 1300 analytical test result and should  not be used for non-medical 1400 purposes. Clinical consideration and professional judgment should 1500 be applied to any positive drug screen result due to possible 1600 interfering substances. A more specific alternate chemical method 1700 must be used in order to obtain a confirmed analytical result.  1800 Gas chromato graphy / mass spectrometry (GC/MS) is the preferred 1900 confirmatory method.   Urinalysis, Complete w Microscopic     Status: Abnormal   Collection Time: 09/26/17  4:10 PM  Result Value Ref Range   Color, Urine COLORLESS (A) YELLOW   APPearance CLEAR (A) CLEAR   Specific Gravity, Urine  1.017 1.005 - 1.030   pH 7.0 5.0 - 8.0   Glucose, UA >=500 (A) NEGATIVE mg/dL   Hgb urine dipstick SMALL (A) NEGATIVE   Bilirubin Urine NEGATIVE NEGATIVE   Ketones, ur NEGATIVE NEGATIVE mg/dL   Protein, ur NEGATIVE NEGATIVE mg/dL   Nitrite NEGATIVE NEGATIVE   Leukocytes, UA NEGATIVE NEGATIVE   RBC / HPF 0-5 0 - 5 RBC/hpf   WBC, UA 0-5 0 - 5 WBC/hpf   Bacteria, UA NONE SEEN NONE SEEN   Squamous Epithelial / LPF NONE SEEN NONE SEEN  Glucose, capillary     Status: Abnormal   Collection Time: 09/26/17  6:55 PM  Result Value Ref Range   Glucose-Capillary 415 (H) 65 - 99 mg/dL  Glucose, capillary     Status: Abnormal   Collection Time: 09/26/17  7:38 PM  Result Value Ref Range   Glucose-Capillary 365 (H) 65 - 99 mg/dL  Glucose, capillary     Status: Abnormal   Collection Time: 09/26/17  8:59 PM  Result Value Ref Range   Glucose-Capillary 271 (H) 65 - 99 mg/dL    Current Facility-Administered Medications  Medication Dose Route Frequency Provider Last Rate Last Dose  . gabapentin (NEURONTIN) capsule 1,200 mg  1,200 mg Oral TID Sofhia Ulibarri T, MD   1,200 mg at 09/27/17 0946  . insulin aspart (novoLOG) injection 6 Units  6 Units Subcutaneous TID WC Tasia Liz, Madie Reno, MD   6 Units at 09/27/17 [phone]  . insulin glargine (LANTUS) injection 38 Units  38 Units Subcutaneous Daily Jissell Trafton, Madie Reno, MD   38 Units at 09/26/17 2104  . mirtazapine (REMERON) tablet 30 mg  30 mg Oral QHS Hinda Kehr, MD   30 mg at 09/26/17 2339  . traZODone (DESYREL) tablet 100 mg  100 mg Oral QHS Merlean Pizzini, Madie Reno, MD   100 mg at 09/26/17 2149  . venlafaxine XR (EFFEXOR-XR) 24 hr capsule 300 mg  300 mg Oral Q breakfast Acel Natzke, Madie Reno, MD   300 mg at 09/27/17 4818   Current Outpatient Medications  Medication Sig Dispense Refill  . blood glucose meter kit and supplies KIT Dispense based on patient and insurance preference. Use up to four times daily as directed. (FOR ICD-9 250.00, 250.01). 1 each 0  . gabapentin  (NEURONTIN) 400 MG capsule Take 3 capsules (1,200 mg total) by mouth 3 (three) times daily. 270 capsule 1  . haloperidol decanoate (HALDOL DECANOATE) 100 MG/ML injection Inject 1 mL (100 mg total) into the muscle every 28 (twenty-eight) days. 1 mL 1  . insulin aspart (NOVOLOG) 100 UNIT/ML injection Inject 6 Units into the skin 3 (three) times daily with meals. 10 mL 11  . insulin glargine (LANTUS) 100 UNIT/ML injection Inject 0.38 mLs (38 Units total) into the skin every morning. 11.4 mL 0  . traZODone (DESYREL) 100 MG tablet Take 1 tablet (100 mg total)  by mouth at bedtime as needed for sleep. 30 tablet 2  . venlafaxine XR (EFFEXOR-XR) 150 MG 24 hr capsule Take 2 capsules (300 mg total) by mouth daily with breakfast. 60 capsule 1    Musculoskeletal: Strength & Muscle Tone: decreased Gait & Station: unsteady Patient leans: N/A  Psychiatric Specialty Exam: Physical Exam  Nursing note and vitals reviewed. Constitutional: He appears well-developed and well-nourished.  HENT:  Head: Normocephalic and atraumatic.  Eyes: Conjunctivae are normal. Pupils are equal, round, and reactive to light.  Neck: Normal range of motion.  Cardiovascular: Regular rhythm and normal heart sounds.  Respiratory: Effort normal. No respiratory distress.  GI: Soft.  Musculoskeletal: Normal range of motion.  Neurological: He is alert.  Skin: Skin is warm and dry.  Psychiatric: His affect is blunt. His speech is delayed. He is slowed. Cognition and memory are impaired. He expresses impulsivity and inappropriate judgment. He exhibits a depressed mood. He expresses suicidal ideation. He expresses no suicidal plans. He exhibits abnormal recent memory.    Review of Systems  Constitutional: Negative.   HENT: Negative.   Eyes: Negative.   Respiratory: Negative.   Cardiovascular: Negative.   Gastrointestinal: Negative.   Musculoskeletal: Negative.   Skin: Negative.   Neurological: Negative.   Psychiatric/Behavioral:  Positive for depression, memory loss and suicidal ideas. Negative for hallucinations and substance abuse. The patient is nervous/anxious and has insomnia.     Blood pressure 108/78, pulse 89, temperature 97.6 F (36.4 C), temperature source Oral, resp. rate 18, height 6' 2"  (1.88 m), weight 97.1 kg (214 lb), SpO2 98 %.Body mass index is 27.48 kg/m.  General Appearance: Disheveled  Eye Contact:  Fair  Speech:  Slow  Volume:  Decreased  Mood:  Depressed and Dysphoric  Affect:  Constricted and Depressed  Thought Process:  Disorganized  Orientation:  Full (Time, Place, and Person)  Thought Content:  Illogical  Suicidal Thoughts:  Yes.  with intent/plan  Homicidal Thoughts:  No  Memory:  Immediate;   Fair Recent;   Poor Remote;   Fair  Judgement:  Impaired  Insight:  Shallow  Psychomotor Activity:  Decreased  Concentration:  Concentration: Poor  Recall:  Poor  Fund of Knowledge:  Fair  Language:  Fair  Akathisia:  No  Handed:  Right  AIMS (if indicated):     Assets:  Desire for Improvement  ADL's:  Impaired  Cognition:  Impaired,  Mild  Sleep:        Treatment Plan Summary: Daily contact with patient to assess and evaluate symptoms and progress in treatment, Medication management and Plan 31 year old man with depressed mood behavior problems passive that he severe medical problems.  Patient presents a difficult situation for the hospital and staff.  Because of his illnesses there is some consensus that he would be best served in perhaps a skilled nursing facility or at least a higher level of care and that he lacks much in the way of ability to take care of himself.  Unfortunately he has no funding source whatsoever.  Also a lot of his behavior has alienated him from local shelters.  Patient continues to voice suicidal ideation and while there appears to be at least some degree of secondary gain involved with that as he also has a history of carrying through on this and could  potentially die from as little as missed management of his diabetes.  There is a consensus right now among multiple professionals that admission to the inpatient unit again would  not be immediately productive.  We are working on coming up with a management strategy between social work physicians and nurses.  Meanwhile he will be managed in the emergency room continue current medicine with attempts to get his sugars under control.  Disposition: See notes above  Alethia Berthold, MD 09/27/2017 12:21 PM

## 2017-09-27 NOTE — ED Notes (Signed)
Patient asleep in room. No noted distress or abnormal behavior. Will continue 15 minute checks and observation by security cameras for safety. 

## 2017-09-27 NOTE — Progress Notes (Signed)
CSW contacted Conservator, museum/galleryJai [name] at [facility] where pt has previously stayed. Mark [name] has previously said pt cannot return.  CSW explained the situation and asked Mark [name] if he would reconsider due to pt extreme lack of options.  Mark [name] reports that pt ruined 4 mattresses at the facility during his last stay due to incontinence.  They are not allowed to use plastic mattress covers and pt was completely soaking these mattresses with urine.  In addition, pt fell over in the kitchen once and Mark [name] concerned about him being unstable and falling again.  Mark [name] reports pt walked around the shelter with a 2 liter bottle of coke all the time and does not appear interested in managing his diabetes.  Nevertheless, Mark [name] agreed to review pt with his staff to see what they could do.  Mark [name] also reported that there is a new program: [facility] [employer].  [facility], which is for people like Mark NeedleMichael with chronic problems with housing and other issues.  There is a referral process and he will look into how to set that up.  He will call back early afternoon.  CSW texted Mark [name] around [place].  Mark [name] responded later and said after reviewing pt with his team, he cannot accept him.  CSW asked about [employer] but has not received a response yet.  CSW asked Mark [name] to also communicate with Mark [name], ED CSW. Mark [name], MSW, LCSW Clinical Social Worker 09/27/2017 3:43 PM

## 2017-09-27 NOTE — ED Notes (Signed)
Pt sitting in hallway bed yelling and cursing at staff and stating that he wants to "get the F out of here, I want to go back to where I was" RN Danelle Earthlyoel at pt bedside at this time

## 2017-09-27 NOTE — Progress Notes (Signed)
Inpatient Diabetes Program Recommendations  AACE/ADA: New Consensus Statement on Inpatient Glycemic Control (2015)  Target Ranges:  Prepandial:   less than 140 mg/dL      Peak postprandial:   less than 180 mg/dL (1-2 hours)      Critically ill patients:  140 - 180 mg/dL   Lab Results  Component Value Date   GLUCAP 271 (H) 09/26/2017   HGBA1C 8.7 (H) 09/11/2017    Review of Glycemic ControlResults for Mark JackWILSON, Mark Q (MRN [id number]) as of 09/27/2017 09:30  Ref. Range 09/26/2017 07:00 09/26/2017 16:06 09/26/2017 18:55 09/26/2017 19:38 09/26/2017 20:59  Glucose-Capillary Latest Ref Range: 65 - 99 mg/dL 409210 (H) 811580 (HH) 914415 (H) 365 (H) 271 (H)   Diabetes history: Type 2 DM Outpatient Diabetes medications:  Novolog 6 units tid with meals, Lantus 38 units q AM Current orders for Inpatient glycemic control:  Lantus 38 units daily, Novolog 6 units tid with meals  Inpatient Diabetes Program Recommendations:   Note readmission-IVC.  Please add Novolog sensitive tid with meals and HS while in the hospital to correct elevated blood sugars.   Thanks, Beryl MeagerJenny Larysa Pall, RN, BC-ADM Inpatient Diabetes Coordinator Pager [phone] (8a-5p)

## 2017-09-27 NOTE — ED Notes (Signed)
Pt. Requested to use bathroom before going to ED quad.  Pt. Escorted via wheelchair to [facility].  Pt. Able to get up from wheelchair unassisted and without difficulty.  Pt. Urinated in toleit and washed hands and sat back in wheelchair.

## 2017-09-27 NOTE — Progress Notes (Addendum)
LCSW reviewed at length with [facility]- SW and Lore[facility]vey from [facility]- his discharge plan.   LCSW will connect with [facility]  Quintella BatonShevan Thompson [phone] Called and left message and not available until Monday Dec 10th  LCSW called Financial Aid office Called and left detailed message for Marlene LardRhonda Lambert [phone]  LCSW [facility]Jay [facility]- Left message awaiting call back  [facility] reports he will follow patient and this worker has agreed to advise him when patient is discharged.  CSW Tammy SoursGreg reported [facility] was [facility] who is pt Brother in Social workerlaw, and his wife ( patient sister) spoke to sister directly and not able to assist her brother at all. ( Her own medical issues)  Texted and advised assistant SW director Wandra MannanZack Brooks of current patient situation.  Will continue to await call backs to see what can be done.   Delta Air LinesClaudine Lakechia Nay LCSW [phone]

## 2017-09-27 NOTE — ED Notes (Signed)
Pt. Transported with escort to ED quad 20 Hallway.

## 2017-09-27 NOTE — ED Notes (Signed)
Pt. Called this nurse in to say.  "I don't want to be in this unit, I want to go to a different unit".  Pt. States "it hurts my back to get out of this bed and the bed is not comfortable".  Pt. Proceeded to walk backwards to shower door and slide down to floor.  Pt. Denies injury but wanted assistance to stand.  Pt. Walked back to room to change clothing.

## 2017-09-27 NOTE — ED Notes (Signed)
IVC PENDING  CONSULT ?

## 2017-09-27 NOTE — ED Notes (Signed)
Pt. Requested change of clothing.  Pt. Stated " I wet my underwear and pants".  Pt. Given change of clothing.

## 2017-09-28 ENCOUNTER — Emergency Department: Payer: Self-pay

## 2017-09-28 LAB — GLUCOSE, CAPILLARY
GLUCOSE-CAPILLARY: 147 mg/dL — AB (ref 65–99)
GLUCOSE-CAPILLARY: 242 mg/dL — AB (ref 65–99)
Glucose-Capillary: 60 mg/dL — ABNORMAL LOW (ref 65–99)
Glucose-Capillary: 284 mg/dL — ABNORMAL HIGH (ref 65–99)

## 2017-09-28 MED ORDER — ARIPIPRAZOLE 5 MG PO TABS
5.0000 mg | ORAL_TABLET | Freq: Every day | ORAL | Status: DC
  Administered 2017-09-28 – 2017-09-29 (×2): 5 mg via ORAL
  Filled 2017-09-28 (×4): qty 1

## 2017-09-28 MED ORDER — GABAPENTIN 600 MG PO TABS
ORAL_TABLET | ORAL | Status: AC
  Filled 2017-09-28: qty 2

## 2017-09-28 MED ORDER — LORAZEPAM 1 MG PO TABS
1.0000 mg | ORAL_TABLET | Freq: Once | ORAL | Status: AC
  Administered 2017-09-28: 1 mg via ORAL
  Filled 2017-09-28: qty 1

## 2017-09-28 MED ORDER — CHLORPROMAZINE HCL 25 MG PO TABS: 25.0000 mg | ORAL_TABLET | Freq: Once | ORAL | Status: DC

## 2017-09-28 MED ORDER — DIAZEPAM 5 MG PO TABS
5.0000 mg | ORAL_TABLET | Freq: Once | ORAL | Status: AC
  Administered 2017-09-28: 5 mg via ORAL
  Filled 2017-09-28: qty 1

## 2017-09-28 MED ORDER — IBUPROFEN 600 MG PO TABS
600.0000 mg | ORAL_TABLET | Freq: Once | ORAL | Status: AC
  Administered 2017-09-28: 600 mg via ORAL
  Filled 2017-09-28: qty 1

## 2017-09-28 MED ORDER — CLONAZEPAM 0.5 MG PO TABS
0.5000 mg | ORAL_TABLET | Freq: Every day | ORAL | Status: DC
  Administered 2017-09-28 – 2017-10-01 (×4): 0.5 mg via ORAL
  Filled 2017-09-28 (×4): qty 1

## 2017-09-28 MED ORDER — IBUPROFEN 600 MG PO TABS: 600.0000 mg | ORAL_TABLET | Freq: Once | ORAL | Status: DC

## 2017-09-28 MED ORDER — NICOTINE 21 MG/24HR TD PT24
21.0000 mg | MEDICATED_PATCH | Freq: Once | TRANSDERMAL | Status: AC
  Administered 2017-09-28: 21 mg via TRANSDERMAL

## 2017-09-28 MED ORDER — NICOTINE 21 MG/24HR TD PT24
MEDICATED_PATCH | TRANSDERMAL | Status: AC
  Administered 2017-09-28: 21 mg via TRANSDERMAL
  Filled 2017-09-28: qty 1

## 2017-09-28 NOTE — ED Notes (Signed)
Pt banging head against side rail on purpose

## 2017-09-28 NOTE — ED Notes (Signed)
Patient given Diet Coke by this NT

## 2017-09-28 NOTE — ED Notes (Signed)
Patient up to restroom no assistance needed asked NT to tell Felicia RN he needed medicine for anxiety

## 2017-09-28 NOTE — ED Notes (Signed)
BEHAVIORAL HEALTH ROUNDING  Patient sleeping: No.  Patient alert and oriented: yes  Behavior appropriate: Yes. ; If no, describe:  Nutrition and fluids offered: Yes  Toileting and hygiene offered: Yes  Sitter present: not applicable, Q 15 min safety rounds and observation.  Law enforcement present: Yes ODS  

## 2017-09-28 NOTE — ED Notes (Signed)
Patient beating on side rail when asked why he stated in attempts to hurt himself

## 2017-09-28 NOTE — ED Notes (Signed)
Pt continues to scream and cursing at staff. Pt refusing all medication and food at this time

## 2017-09-28 NOTE — ED Notes (Signed)
ENVIRONMENTAL ASSESSMENT  Potentially harmful objects out of patient reach: Yes.  Personal belongings secured: Yes.  Patient dressed in hospital provided attire only: Yes.  Plastic bags out of patient reach: Yes.  Patient care equipment (cords, cables, call bells, lines, and drains) shortened, removed, or accounted for: Yes.  Equipment and supplies removed from bottom of stretcher: Yes.  Potentially toxic materials out of patient reach: Yes.  Sharps container removed or out of patient reach: Yes.   BEHAVIORAL HEALTH ROUNDING Patient sleeping: Yes.   Patient alert and oriented: not applicable SLEEPING Behavior appropriate: Yes.  ; If no, describe: SLEEPING Nutrition and fluids offered: No SLEEPING Toileting and hygiene offered: NoSLEEPING Sitter present: not applicable, Q 15 min safety rounds and observation. Law enforcement present: Yes ODS  ED BHU PLACEMENT JUSTIFICATION  Is the patient under IVC or is there intent for IVC: Yes.  Is the patient medically cleared: Yes.  Is there vacancy in the ED BHU: Yes.  Is the population mix appropriate for patient: Yes.  Is the patient awaiting placement in inpatient or outpatient setting: Yes.  Has the patient had a psychiatric consult: Yes.  Survey of unit performed for contraband, proper placement and condition of furniture, tampering with fixtures in bathroom, shower, and each patient room: Yes. ; Findings: All clear  APPEARANCE/BEHAVIOR  Restless but cooperative at this time and adequate rapport can be established  NEURO ASSESSMENT  Orientation: time, place and person  Hallucinations: Denies  Speech: Normal  Gait: normal  RESPIRATORY ASSESSMENT  WNL  CARDIOVASCULAR ASSESSMENT  WNL  GASTROINTESTINAL ASSESSMENT  WNL  EXTREMITIES  WNL  PLAN OF CARE  Provide calm/safe environment. Vital signs assessed TID. ED BHU Assessment once each 12-hour shift. Collaborate with TTS daily or as condition indicates. Assure the ED provider has  rounded once each shift. Provide and encourage hygiene. Provide redirection as needed. Assess for escalating behavior; address immediately and inform ED provider.  Assess family dynamic and appropriateness for visitation as needed: Yes. ; If necessary, describe findings:  Educate the patient/family about BHU procedures/visitation: Yes. ; If necessary, describe findings: Pt is calm and cooperative at this time. Pt understanding and accepting of unit procedures/rules. Will continue to monitor with Q 15 min safety rounds and observation.

## 2017-09-28 NOTE — ED Notes (Signed)
Patient asked to get medicine for agitation this NT [facility]

## 2017-09-28 NOTE — ED Notes (Signed)
Patient continues to be verbally abusive to staff using profanity

## 2017-09-28 NOTE — ED Notes (Signed)
Felicia RN aware that patient is beating hand on siderail

## 2017-09-28 NOTE — ED Notes (Signed)
CBG performed by this NT [facility]

## 2017-09-28 NOTE — ED Notes (Signed)
Dr Pershing ProudSchaevitz informed of patients continued co's. MD requested this RN to give the patient his night time medications early. Will give now.

## 2017-09-28 NOTE — ED Notes (Signed)
Patient [facility] on side of bed [facility]; Report given to oncoming NT

## 2017-09-28 NOTE — ED Notes (Signed)
Patient back from x-ray 

## 2017-09-28 NOTE — ED Provider Notes (Signed)
-----------------------------------------   5:45 PM on 09/28/2017 -----------------------------------------   Blood pressure 111/69, pulse 88, temperature 98.4 F (36.9 C), temperature source Oral, resp. rate 18, height 6\' 2"  (1.88 m), weight 97.1 kg (214 lb), SpO2 97 %.  Patient is agitated and cursing.  Punching his bed with his hand.  Requesting to be evaluated again by psychiatry.     Myrna BlazerSchaevitz, Bing Duffey Matthew, MD 09/28/17 [phone]

## 2017-09-28 NOTE — ED Provider Notes (Signed)
-----------------------------------------   7:15 AM on 09/28/2017 -----------------------------------------   Blood pressure 118/72, pulse 88, temperature 98.4 F (36.9 C), temperature source Oral, resp. rate 16, height 6\' 2"  (1.88 m), weight 97.1 kg (214 lb), SpO2 99 %.  The patient had no acute events since last update.  Calm and cooperative at this time.  Disposition is pending Psychiatry/Behavioral Medicine team recommendations.     Rebecka ApleyWebster, Allison P, MD 09/28/17 [phone]

## 2017-09-28 NOTE — ED Provider Notes (Signed)
Reconsulted the psychiatrist and they are now recommending the patient be admitted to inpatient psychiatric unit.  We will continue the involuntary commitment.  Medications ordered.   Myrna BlazerSchaevitz, David Matthew, MD 09/28/17 [phone]

## 2017-09-28 NOTE — ED Notes (Signed)
BEHAVIORAL HEALTH ROUNDING  Patient sleeping: No.  Patient alert and oriented: yes  Behavior appropriate: Yes. ; If no, describe:  Nutrition and fluids offered: Yes  Toileting and hygiene offered: Yes  Sitter present: not applicable, Q 15 min safety rounds and observation.  Law enforcement present: Yes ODS  Pt continues to be restless. Pacing. Reports the valium he had earlier is not helping his discomfort. Pt describes it as a muscle spasm. Will let Dr Pershing ProudSchaevitz know.

## 2017-09-28 NOTE — ED Notes (Signed)
Assumed care of pt from NT [place]; Patient awake resting comfortably on bed when rounds were made

## 2017-09-28 NOTE — ED Notes (Signed)
Pt eating lunch tray  

## 2017-09-28 NOTE — ED Notes (Signed)
Patient to X ray at this time.

## 2017-09-28 NOTE — ED Notes (Signed)
Pt continues to be uncooperative at this time, yelling that "[place] a piece [place]." MD reordered psych consult for reevaluation

## 2017-09-28 NOTE — ED Notes (Signed)
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

## 2017-09-28 NOTE — ED Notes (Signed)
Patient refused dinner tray.  

## 2017-09-28 NOTE — ED Notes (Signed)
Pt quite agitated at not having tv, denies any safety concerns or urinating anywhere in the BHU except in the toilet; pt angry and won't contract for safety and resists rails up and fall precautions, pt wants to "fire me", charge RN notifed

## 2017-09-28 NOTE — ED Notes (Signed)
Pt requesting thorazine but now refusing. Pt states I want "valium or a percocet." MD notified of pts request

## 2017-09-28 NOTE — ED Notes (Addendum)
cbg 60. md notified. Pt given meal tray w/ juice

## 2017-09-28 NOTE — ED Provider Notes (Signed)
-----------------------------------------   4:49 PM on 09/28/2017 -----------------------------------------   Blood pressure 111/69, pulse 88, temperature 98.4 F (36.9 C), temperature source Oral, resp. rate 18, height 6\' 2"  (1.88 m), weight 97.1 kg (214 lb), SpO2 97 %.  The patient had no acute events since last update.  Calm and cooperative at this time.   Patient complaining of right hip pain and says he has been able to walk but only barely ever since falling yesterday.  However, the patient is also complaining of chronic hip pain.  Will given 600 mg of ibuprofen we will x-ray the patient's right hip.     Myrna BlazerSchaevitz, David Matthew, MD 09/28/17 [phone]

## 2017-09-28 NOTE — ED Notes (Signed)
Pt asking to speak to doctor. Md notified. Dr. Don PerkingVeronese talking to pt

## 2017-09-28 NOTE — ED Notes (Signed)
Pt acting out, screaming, cursing at [employer].

## 2017-09-28 NOTE — ED Notes (Signed)
BEHAVIORAL HEALTH ROUNDING Patient sleeping: Yes.   Patient alert and oriented: not applicable SLEEPING Behavior appropriate: Yes.  ; If no, describe: SLEEPING Nutrition and fluids offered: No SLEEPING Toileting and hygiene offered: NoSLEEPING Sitter present: not applicable, Q 15 min safety rounds and observation. Law enforcement present: Yes ODS 

## 2017-09-28 NOTE — ED Notes (Signed)
Patient agitated at this time using profanity toward RN Sunny SchleinFelicia calling her very inappropriate names

## 2017-09-28 NOTE — ED Notes (Signed)
Pt requesting med for anxiety, MD notified

## 2017-09-28 NOTE — ED Notes (Signed)
Pt c/o right hip pain, md notified. No new orders at this time

## 2017-09-28 NOTE — ED Notes (Signed)
Called pharmacy for [employer]

## 2017-09-28 NOTE — ED Notes (Signed)
Patient up to bathroom returned to room sitting on side of bed complaining of hip pain; Felicia RN notified

## 2017-09-28 NOTE — ED Notes (Signed)
Patient is IVC and is pending inpatient admission. 

## 2017-09-29 LAB — GLUCOSE, CAPILLARY
GLUCOSE-CAPILLARY: 122 mg/dL — AB (ref 65–99)
GLUCOSE-CAPILLARY: 74 mg/dL (ref 65–99)
Glucose-Capillary: 398 mg/dL — ABNORMAL HIGH (ref 65–99)
Glucose-Capillary: 329 mg/dL — ABNORMAL HIGH (ref 65–99)

## 2017-09-29 MED ORDER — DIAZEPAM 5 MG PO TABS
5.0000 mg | ORAL_TABLET | Freq: Once | ORAL | Status: AC
  Administered 2017-09-29: 5 mg via ORAL
  Filled 2017-09-29: qty 1

## 2017-09-29 MED ORDER — ZIPRASIDONE MESYLATE 20 MG IM SOLR
INTRAMUSCULAR | Status: AC
  Administered 2017-09-29: 20 mg via INTRAMUSCULAR
  Filled 2017-09-29: qty 20

## 2017-09-29 MED ORDER — ZIPRASIDONE MESYLATE 20 MG IM SOLR
20.0000 mg | Freq: Once | INTRAMUSCULAR | Status: AC
  Administered 2017-09-29: 20 mg via INTRAMUSCULAR

## 2017-09-29 MED ORDER — LORAZEPAM 2 MG PO TABS: 2.0000 mg | ORAL_TABLET | Freq: Once | ORAL | Status: DC

## 2017-09-29 MED ORDER — NICOTINE 21 MG/24HR TD PT24
21.0000 mg | MEDICATED_PATCH | Freq: Once | TRANSDERMAL | Status: AC
  Administered 2017-09-29: 21 mg via TRANSDERMAL
  Filled 2017-09-29: qty 1

## 2017-09-29 MED ORDER — LORAZEPAM 2 MG PO TABS
2.0000 mg | ORAL_TABLET | Freq: Once | ORAL | Status: DC
  Filled 2017-09-29: qty 1

## 2017-09-29 NOTE — ED Notes (Signed)
BEHAVIORAL HEALTH ROUNDING  Patient sleeping: No.  Patient alert and oriented: yes  Behavior appropriate: Yes. ; If no, describe:  Nutrition and fluids offered: Yes  Toileting and hygiene offered: Yes  Sitter present: not applicable, Q 15 min safety rounds and observation.  Law enforcement present: Yes ODS  

## 2017-09-29 NOTE — BHH Counselor (Signed)
[facility]Anne and they do not have any male beds for psychotic patients at this time.

## 2017-09-29 NOTE — ED Notes (Signed)
Patient lying supine on stretcher watching tv. Even and non labored respirations noted.

## 2017-09-29 NOTE — ED Notes (Signed)
Patient is eating the contents of his breakfast tray at this time. 
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
## 2017-09-29 NOTE — BHH Counselor (Signed)
Referral information for Psychiatric Hospitalization faxed to;      [facility] ([phone] or [phone])   [facility] ([phone]),    [facility] ([phone]),    [facility] ([phone]),    [facility] ([phone]),    [facility] ([phone]),     [facility] ([phone]).     

## 2017-09-29 NOTE — ED Notes (Signed)
Patient requesting different bed. ED charge RN, Tammy SoursGreg notified. Patient offered newer stretcher. Patient in agreement with plan. Stretchers switched out by this Charity fundraiserN, assisted by Lafonda Mossesiana, EDT.

## 2017-09-29 NOTE — ED Notes (Signed)
ENVIRONMENTAL ASSESSMENT  Potentially harmful objects out of patient reach: Yes.  Personal belongings secured: Yes.  Patient dressed in hospital provided attire only: Yes.  Plastic bags out of patient reach: Yes.  Patient care equipment (cords, cables, call bells, lines, and drains) shortened, removed, or accounted for: Yes.  Equipment and supplies removed from bottom of stretcher: Yes.  Potentially toxic materials out of patient reach: Yes.  Sharps container removed or out of patient reach: Yes.   BEHAVIORAL HEALTH ROUNDING  Patient sleeping: No.  Patient alert and oriented: yes  Behavior appropriate: Yes. ; If no, describe:  Nutrition and fluids offered: Yes  Toileting and hygiene offered: Yes  Sitter present: not applicable, Q 15 min safety rounds and observation.  Law enforcement present: Yes ODS  ED BHU PLACEMENT JUSTIFICATION  Is the patient under IVC or is there intent for IVC: Yes.  Is the patient medically cleared: Yes.  Is there vacancy in the ED BHU: Yes.  Is the population mix appropriate for patient: no.  Is the patient awaiting placement in inpatient or outpatient setting: Yes.  Has the patient had a psychiatric consult: Yes.  Survey of unit performed for contraband, proper placement and condition of furniture, tampering with fixtures in bathroom, shower, and each patient room: Yes. ; Findings: All clear  APPEARANCE/BEHAVIOR  calm, cooperative and adequate rapport can be established  NEURO ASSESSMENT  Orientation: time, place and person  Hallucinations: No.None noted (Hallucinations)  Speech: Normal  Gait: unsteady RESPIRATORY ASSESSMENT  WNL  CARDIOVASCULAR ASSESSMENT  WNL  GASTROINTESTINAL ASSESSMENT  WNL  EXTREMITIES  WNL  PLAN OF CARE  Provide calm/safe environment. Vital signs assessed TID. ED BHU Assessment once each 12-hour shift. Collaborate with TTS daily or as condition indicates. Assure the ED provider has rounded once each shift. Provide and  encourage hygiene. Provide redirection as needed. Assess for escalating behavior; address immediately and inform ED provider.  Assess family dynamic and appropriateness for visitation as needed: Yes. ; If necessary, describe findings:  Educate the patient/family about BHU procedures/visitation: Yes. ; If necessary, describe findings: Pt is calm and cooperative at this time. Pt understanding and accepting of unit procedures/rules. Will continue to monitor with Q 15 min safety rounds and observation.

## 2017-09-29 NOTE — ED Notes (Signed)
Patient has taken off nicotine patch. Requesting new patch. See orders.

## 2017-09-29 NOTE — ED Notes (Signed)
Patient in agreement for IM injection of geodon per order.

## 2017-09-29 NOTE — ED Notes (Signed)
BEHAVIORAL HEALTH ROUNDING Patient sleeping: Yes.   Patient alert and oriented: not applicable SLEEPING Behavior appropriate: Yes.  ; If no, describe: SLEEPING Nutrition and fluids offered: No SLEEPING Toileting and hygiene offered: NoSLEEPING Sitter present: not applicable, Q 15 min safety rounds and observation. Law enforcement present: Yes ODS 

## 2017-09-29 NOTE — ED Provider Notes (Signed)
-----------------------------------------   3:31 PM on 09/29/2017 -----------------------------------------  Patient has become increasingly agitated and verbally aggressive.  Patient is attempting to leave his room is arguing with staff is threatening to punch staff if they talk to him.  Given the patient's increasing agitation we will dose IM Geodon.  This was discussed with the patient who is agreeable to the IM injection.   Minna AntisPaduchowski, Cailie Bosshart, MD 09/29/17 [phone]

## 2017-09-29 NOTE — ED Notes (Signed)
Patient was offered to take a shower this morning. Patient stated that he washed up last night and that he did not want to take a shower this morning.
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
## 2017-09-29 NOTE — ED Provider Notes (Signed)
-----------------------------------------   7:42 AM on 09/29/2017 -----------------------------------------   Blood pressure 105/66, pulse 68, temperature 97.8 F (36.6 C), temperature source Oral, resp. rate 16, height 6\' 2"  (1.88 m), weight 97.1 kg (214 lb), SpO2 99 %.  The patient had no acute events since last update.  Calm and cooperative at this time.  Disposition is pending Psychiatry/Behavioral Medicine team recommendations.     Merrily Brittleifenbark, Chantelle Verdi, MD 09/29/17 [phone]

## 2017-09-29 NOTE — ED Notes (Signed)
Patient ambulatory to restroom with 1 staff assist. Patient calm and cooperative at this time.
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
## 2017-09-29 NOTE — ED Notes (Signed)
Patient requesting "something for anxiety". MD notified. See orders.

## 2017-09-30 LAB — GLUCOSE, CAPILLARY
GLUCOSE-CAPILLARY: 223 mg/dL — AB (ref 65–99)
GLUCOSE-CAPILLARY: 133 mg/dL — AB (ref 65–99)

## 2017-09-30 MED ORDER — QUETIAPINE FUMARATE 25 MG PO TABS
100.0000 mg | ORAL_TABLET | Freq: Three times a day (TID) | ORAL | Status: DC
  Administered 2017-09-30 – 2017-10-03 (×9): 100 mg via ORAL
  Filled 2017-09-30 (×9): qty 4

## 2017-09-30 NOTE — ED Notes (Signed)
Gave patient diet ginger ale and pack of graham crackers.

## 2017-09-30 NOTE — ED Notes (Signed)
Gave patient graham crackers. 

## 2017-09-30 NOTE — ED Notes (Signed)
Resumed care from [place], [place]. Pt resting comfortably; NAD noted. Will continue to monitor.

## 2017-09-30 NOTE — Progress Notes (Signed)
Inpatient Diabetes Program Recommendations  AACE/ADA: New Consensus Statement on Inpatient Glycemic Control (2015)  Target Ranges:  Prepandial:   less than 140 mg/dL      Peak postprandial:   less than 180 mg/dL (1-2 hours)      Critically ill patients:  140 - 180 mg/dL    Results for Mark [name], Yazan Q (MRN [id number]) as of 09/30/2017 10:39  Ref. Range 09/29/2017 08:29 09/29/2017 13:05 09/29/2017 18:14 09/29/2017 19:33 09/29/2017 22:04  Glucose-Capillary Latest Ref Range: 65 - 99 mg/dL 409122 (H) 74 >811>600 (HH) 914398 (H) 329 (H)   Results for Mark [name], Silus Q (MRN [id number]) as of 09/30/2017 10:39  Ref. Range 09/30/2017 08:27  Glucose-Capillary Latest Ref Range: 65 - 99 mg/dL 086223 (H)    Home DM Meds: Lantus 38 units daily       Novolog 6 units TID  Current Insulin Orders: Lantus 38 units daily                 Novolog 6 units TID      MD- Note patient refused Insulin and Food this AM per nursing notes.  Please consider starting Novolog Moderate Correction Scale/ SSI (0-15 units) TID AC + HS while patient holding in ED.  Use Glycemic Control Order set         --Will follow patient during hospitalization--  Ambrose FinlandJeannine Johnston Adele Milson RN, MSN, CDE Diabetes Coordinator Inpatient Glycemic Control Team Team Pager: [phone] (8a-5p)

## 2017-09-30 NOTE — ED Notes (Signed)
Gave patient a food tray and diet coke. Patient was very pleasant.

## 2017-09-30 NOTE — ED Notes (Signed)
BEHAVIORAL HEALTH ROUNDING  Patient sleeping: No.  Patient alert and oriented: yes  Behavior appropriate: Yes. ; If no, describe:  Nutrition and fluids offered: Yes  Toileting and hygiene offered: Yes  Sitter present: not applicable, Q 15 min safety rounds and observation.  Law enforcement present: Yes ODS  

## 2017-09-30 NOTE — ED Notes (Signed)
Pt expressing frustration at being in the room for so long. He is under the impression that others have been moved downstairs today. This nurse assured him that no one has moved from this area today and that the likelihood of anyone moving is slim due to the weather and staffing situation here and at most facilities. [facility] and was told that is unlikely due to the fact that he is a fall risk since he has neuropathy and has hx of falls. Pt accepts the fact that he is going to be here for the rest of the day at least.

## 2017-09-30 NOTE — ED Notes (Signed)
Patient resting to talk with charge nurse at this time called charge nurse Stephaine .

## 2017-09-30 NOTE — Consult Note (Signed)
[facility]iatry Consult   Reason for Consult: Follow-up consult for 31 year old man with mood symptoms and behavior problems Referring Physician: [place] Patient Identification: Mark [name] MRN:  [id number] Principal Diagnosis: MDD (major depressive disorder) Diagnosis:   Patient Active Problem List   Diagnosis Date Noted  . Personality disorder ([place]) [F60.9] 09/26/2017  . 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 Obstructive hydrocephalus [G91.1] 09/19/2017  . Diabetes ([place]) [E11.9] 09/11/2017  . Hypoglycemia [E16.2] 09/09/2017  . MDD (major depressive disorder) [F32.9] 08/22/2017  . 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 Nausea & vomiting [R11.2] 08/18/2017  . Abdominal pain [R10.9] 08/18/2017  . Hyponatremia [E87.1] 08/18/2017  . DKA (diabetic ketoacidoses) ([place]) [E13.10] 11/24/2015  . Diabetic keto-acidosis ([place]) [E13.10] 10/23/2015    Total Time spent with patient: 30 minutes  Subjective:   Mark [name] is a 31 y.o. male patient admitted with "what you doing with me".  HPI: Follow-up note for this 31 year old man with behavior problems hydrocephalus cognitive impairment major social problems.  Patient has been in the emergency room over the weekend.  Treatment team had discussed him last week and felt that he was not appropriate for inpatient hospitalization.  Patient has been disruptive tearing up his room.  Makes threats to kill himself if we do not admit him to the hospital.  Maintains irritable affect.  Past Psychiatric History: History of suicide attempts history of impulsive agitated behavior  Risk to Self: Suicidal Ideation: Yes-Currently Present Suicidal Intent: Yes-Currently Present Is patient at risk for suicide?: Yes Suicidal Plan?: Yes-Currently Present Specify Current Suicidal Plan: not take his insulin Access to Means: Yes Specify Access to Suicidal Means: he can stop taking his insulin.  He is  diabetic. What has been your use of drugs/alcohol within the last 12 months?: none How many times?: 5 Other Self Harm Risks: none Triggers for Past Attempts: Unpredictable Intentional Self Injurious Behavior: Cutting Comment - Self Injurious Behavior: cut self in a suicide attempt Risk to Others: Homicidal Ideation: No Thoughts of Harm to Others: No Current Homicidal Intent: No Current Homicidal Plan: No Access to Homicidal Means: No Identified Victim: NA History of harm to others?: No Assessment of Violence: None Noted Violent Behavior Description: none Does patient have access to weapons?: No Criminal Charges Pending?: No Does patient have a court date: No Prior Inpatient Therapy: Prior Inpatient Therapy: Yes Prior Therapy Dates: numerous, most recent November 2018 Prior Therapy Facilty/Provider(s): [facility], [facility], [facility] Reason for Treatment: depression and psychosis Prior Outpatient Therapy: Prior Outpatient Therapy: Yes Prior Therapy Dates: current Prior Therapy Facilty/Provider(s): [facility] Reason for Treatment: schizoaffective disorder Does patient have an ACCT team?: No Does patient have Intensive In-House Services?  : No Does patient have [facility] services? : No Does patient have P4CC services?: No  Past Medical History:  Past Medical History:  Diagnosis Date  . Diabetes mellitus without complication ([place])   . Hydrocephalus   . Kidney stones     Past Surgical History:  Procedure Laterality Date  . KIDNEY STONE SURGERY     Family History:  Family History  Problem Relation Age of Onset  . Breast cancer Mother    Family Psychiatric  History: None identified Social History:  Social History   Substance and Sexual Activity  Alcohol Use No   Comment: ocasional      Social History   Substance and Sexual Activity  Drug Use Yes  . Types: Marijuana   Comment: "as often as I  can"    Social History   Socioeconomic History  . Marital status: Single    Spouse  name: None  . Number of children: None  . Years of education: None  . Highest education level: None  Social Needs  . Financial resource strain: None  . Food insecurity - worry: None  . Food insecurity - inability: None  . Transportation needs - medical: None  . Transportation needs - non-medical: None  Occupational History  . None  Tobacco Use  . Smoking status: Current Every Day Smoker    Packs/day: 1.00    Types: Cigarettes  . Smokeless tobacco: Never Used  Substance and Sexual Activity  . Alcohol use: No    Comment: ocasional   . Drug use: Yes    Types: Marijuana    Comment: "as often as I can"  . Sexual activity: None  Other Topics Concern  . None  Social History Narrative  . None   Additional Social History:    Allergies:   Allergies  Allergen Reactions  . Penicillins Anaphylaxis, Hives and Swelling    Has patient had a PCN reaction causing immediate rash, facial/tongue/throat swelling, SOB or lightheadedness with hypotension: Yes Has patient had a PCN reaction causing severe rash involving mucus membranes or skin necrosis: No Has patient had a PCN reaction that required hospitalization Yes Has patient had a PCN reaction occurring within the last 10 years: Yes If all of the above answers are "NO", then may proceed with Cephalosporin use.  . Sulfa Antibiotics Anaphylaxis and Hives  . Clindamycin/Lincomycin Hives    Labs:  Results for orders placed or performed during the hospital encounter of 09/26/17 (from the past 48 hour(s))  Glucose, capillary     Status: Abnormal   Collection Time: 09/28/17  8:42 PM  Result Value Ref Range   Glucose-Capillary 284 (H) 65 - 99 mg/dL  Glucose, capillary     Status: Abnormal   Collection Time: 09/29/17  8:29 AM  Result Value Ref Range   Glucose-Capillary 122 (H) 65 - 99 mg/dL  Glucose, capillary     Status: None   Collection Time: 09/29/17  1:05 PM  Result Value Ref Range   Glucose-Capillary 74 65 - 99 mg/dL  Glucose,  capillary     Status: Abnormal   Collection Time: 09/29/17  6:14 PM  Result Value Ref Range   Glucose-Capillary >600 (HH) 65 - 99 mg/dL  Glucose, capillary     Status: Abnormal   Collection Time: 09/29/17  7:33 PM  Result Value Ref Range   Glucose-Capillary 398 (H) 65 - 99 mg/dL  Glucose, capillary     Status: Abnormal   Collection Time: 09/29/17 10:04 PM  Result Value Ref Range   Glucose-Capillary 329 (H) 65 - 99 mg/dL  Glucose, capillary     Status: Abnormal   Collection Time: 09/30/17  8:27 AM  Result Value Ref Range   Glucose-Capillary 223 (H) 65 - 99 mg/dL  Glucose, capillary     Status: Abnormal   Collection Time: 09/30/17 12:38 PM  Result Value Ref Range   Glucose-Capillary 133 (H) 65 - 99 mg/dL    Current Facility-Administered Medications  Medication Dose Route Frequency Provider Last Rate Last Dose  . ARIPiprazole (ABILIFY) tablet 5 mg  5 mg Oral QHS Orbie Pyo, MD   5 mg at 09/29/17 2031  . chlorproMAZINE (THORAZINE) tablet 25 mg  25 mg Oral Once Orbie Pyo, MD      . clonazePAM (  KLONOPIN) tablet 0.5 mg  0.5 mg Oral QHS Schaevitz, Randall An, MD   0.5 mg at 09/29/17 2031  . gabapentin (NEURONTIN) capsule 1,200 mg  1,200 mg Oral TID Aeon Koors, Madie Reno, MD   1,200 mg at 09/30/17 0951  . insulin aspart (novoLOG) injection 6 Units  6 Units Subcutaneous TID WC Tyree Fluharty, Madie Reno, MD   6 Units at 09/30/17 1708  . insulin glargine (LANTUS) injection 38 Units  38 Units Subcutaneous Daily Kenlie Seki, Madie Reno, MD   38 Units at 09/29/17 2035  . LORazepam (ATIVAN) tablet 2 mg  2 mg Oral Once Darel Hong, MD      . mirtazapine (REMERON) tablet 30 mg  30 mg Oral QHS Hinda Kehr, MD   30 mg at 09/29/17 2034  . traZODone (DESYREL) tablet 100 mg  100 mg Oral QHS Josalin Carneiro, Madie Reno, MD   100 mg at 09/29/17 2033  . venlafaxine XR (EFFEXOR-XR) 24 hr capsule 300 mg  300 mg Oral Q breakfast Angila Wombles, Madie Reno, MD   300 mg at 09/30/17 0865   Current Outpatient Medications   Medication Sig Dispense Refill  . gabapentin (NEURONTIN) 400 MG capsule Take 3 capsules (1,200 mg total) by mouth 3 (three) times daily. 270 capsule 1  . haloperidol (HALDOL) 5 MG tablet Take 5 mg by mouth 2 (two) times daily.    . insulin aspart (NOVOLOG) 100 UNIT/ML injection Inject 6 Units into the skin 3 (three) times daily with meals. 10 mL 11  . insulin glargine (LANTUS) 100 UNIT/ML injection Inject 0.38 mLs (38 Units total) into the skin every morning. 11.4 mL 0  . mirtazapine (REMERON) 30 MG tablet Take 30 mg by mouth at bedtime.    . traZODone (DESYREL) 100 MG tablet Take 1 tablet (100 mg total) by mouth at bedtime as needed for sleep. 30 tablet 2  . venlafaxine XR (EFFEXOR-XR) 75 MG 24 hr capsule Take 225 mg by mouth daily with breakfast.    . zolpidem (AMBIEN) 10 MG tablet Take 10 mg by mouth at bedtime as needed for sleep.    . blood glucose meter kit and supplies KIT Dispense based on patient and insurance preference. Use up to four times daily as directed. (FOR ICD-9 250.00, 250.01). 1 each 0  . haloperidol decanoate (HALDOL DECANOATE) 100 MG/ML injection Inject 1 mL (100 mg total) into the muscle every 28 (twenty-eight) days. (Patient not taking: Reported on 09/28/2017) 1 mL 1  . venlafaxine XR (EFFEXOR-XR) 150 MG 24 hr capsule Take 2 capsules (300 mg total) by mouth daily with breakfast. (Patient not taking: Reported on 09/28/2017) 60 capsule 1    Musculoskeletal: Strength & Muscle Tone: within normal limits Gait & Station: normal Patient leans: N/A  Psychiatric Specialty Exam: Physical Exam  Nursing note and vitals reviewed. Constitutional: He appears well-developed and well-nourished.  HENT:  Head: Normocephalic and atraumatic.  Eyes: Conjunctivae are normal. Pupils are equal, round, and reactive to light.  Neck: Normal range of motion.  Cardiovascular: Regular rhythm and normal heart sounds.  Respiratory: Effort normal. No respiratory distress.  GI: Soft.   Musculoskeletal: Normal range of motion.  Neurological: He is alert.  Skin: Skin is warm and dry.  Psychiatric: His affect is labile and inappropriate. His speech is tangential. He is agitated. Cognition and memory are impaired. He expresses impulsivity. He expresses suicidal ideation.    Review of Systems  Constitutional: Negative.   HENT: Negative.   Eyes: Negative.   Respiratory: Negative.  Cardiovascular: Negative.   Gastrointestinal: Negative.   Musculoskeletal: Negative.   Skin: Negative.   Neurological: Negative.   Psychiatric/Behavioral: Positive for depression and suicidal ideas. Negative for hallucinations, memory loss and substance abuse. The patient is nervous/anxious. The patient does not have insomnia.     Blood pressure 129/87, pulse 93, temperature 98.1 F (36.7 C), temperature source Oral, resp. rate 16, height 6' 2"  (1.88 m), weight 97.1 kg (214 lb), SpO2 100 %.Body mass index is 27.48 kg/m.  General Appearance: Disheveled  Eye Contact:  Fair  Speech:  Slow  Volume:  Decreased  Mood:  Dysphoric  Affect:  Depressed and Inappropriate  Thought Process:  Goal Directed  Orientation:  Full (Time, Place, and Person)  Thought Content:  Illogical  Suicidal Thoughts:  Yes.  with intent/plan  Homicidal Thoughts:  Yes.  without intent/plan  Memory:  Immediate;   Fair Recent;   Fair Remote;   Fair  Judgement:  Impaired  Insight:  Shallow  Psychomotor Activity:  Decreased  Concentration:  Concentration: Poor  Recall:  Poor  Fund of Knowledge:  Poor  Language:  Fair  Akathisia:  No  Handed:  Right  AIMS (if indicated):     Assets:  Desire for Improvement  ADL's:  Impaired  Cognition:  Impaired,  Mild  Sleep:        Treatment Plan Summary: Daily contact with patient to assess and evaluate symptoms and progress in treatment, Medication management and Plan 31 year old man with behavioral disturbance noncompliance mood symptoms.  Unclear what the best option for  this patient is.  Weather conditions have disrupted our ability to have a full discussion as a psych team but I anticipate we will be discussing this within the next day or so.  Continue current medicine and monitoring.  Disposition: Patient does not meet criteria for psychiatric inpatient admission.  Alethia Berthold, MD 09/30/2017 6:46 PM

## 2017-09-30 NOTE — ED Notes (Addendum)
BEHAVIORAL HEALTH ROUNDING Patient sleeping: Yes.   Patient alert and oriented: not applicable SLEEPING Behavior appropriate: Yes.  ; If no, describe: SLEEPING Nutrition and fluids offered: No SLEEPING Toileting and hygiene offered: NoSLEEPING Sitter present: not applicable, Q 15 min safety rounds and observation. Law enforcement present: Yes ODS 

## 2017-09-30 NOTE — ED Notes (Signed)
Pt ate all his dinner; asked for and received another diet coke

## 2017-09-30 NOTE — ED Notes (Signed)
BEHAVIORAL HEALTH ROUNDING Patient sleeping: Yes.   Patient alert and oriented: not applicable SLEEPING Behavior appropriate: Yes.  ; If no, describe: SLEEPING Nutrition and fluids offered: No SLEEPING Toileting and hygiene offered: NoSLEEPING Sitter present: not applicable, Q 15 min safety rounds and observation. Law enforcement present: Yes ODS 

## 2017-09-30 NOTE — ED Notes (Signed)
Patient standing in doorway wanting to get transferred to [facility] notified nurse [name] and charge nurse [name] ,explained to patient they both were busy in another room at this time,patient in room hitting his hands on the walls.

## 2017-09-30 NOTE — ED Notes (Signed)
Patient standing in doorway request to talk charge nurse

## 2017-09-30 NOTE — ED Notes (Signed)
Gave patient a diet coke.

## 2017-09-30 NOTE — ED Notes (Signed)
Pt refused food and insulin. States he is not going to eat or take medications until he is released/sent out. Pt angry, hostile, insulting to this nurse and NT. Pt given the opportunity to change his mind; then he was left alone so he can gather himself. Pt shouting obscenities down the hallway. Will attempt breakfast and insulin injection later.

## 2017-09-30 NOTE — ED Provider Notes (Signed)
-----------------------------------------   6:45 AM on 09/30/2017 -----------------------------------------   Blood pressure 102/76, pulse 80, temperature 98.1 F (36.7 C), temperature source Oral, resp. rate 16, height 6\' 2"  (1.88 m), weight 97.1 kg (214 lb), SpO2 100 %.  The patient had no acute events since last update.  Calm and cooperative at this time.  The patient was referred to many hospitals and is awaiting acceptance.     Rebecka ApleyWebster, Quanta Robertshaw P, MD 09/30/17 [phone]

## 2017-10-01 DIAGNOSIS — F332 Major depressive disorder, recurrent severe without psychotic features: Secondary | ICD-10-CM

## 2017-10-01 LAB — GLUCOSE, CAPILLARY
GLUCOSE-CAPILLARY: 195 mg/dL — AB (ref 65–99)
Glucose-Capillary: 82 mg/dL (ref 65–99)
Glucose-Capillary: 303 mg/dL — ABNORMAL HIGH (ref 65–99)

## 2017-10-01 MED ORDER — MIRTAZAPINE 15 MG PO TABS
45.0000 mg | ORAL_TABLET | Freq: Every day | ORAL | Status: DC
  Administered 2017-10-01 – 2017-10-02 (×2): 45 mg via ORAL
  Filled 2017-10-01 (×2): qty 3

## 2017-10-01 NOTE — ED Notes (Signed)
Pt given new shirt because he spilled apple sauce on his.

## 2017-10-01 NOTE — ED Notes (Signed)
Sent message to pharmacy regarding missing medication.

## 2017-10-01 NOTE — ED Notes (Signed)
TT[facility]  She reports that Casimiro NeedleMichael has a GafferCare Coordinator. His Care Coordinator is Betsy PriesChervonne Johnson [phone]- [phone].

## 2017-10-01 NOTE — ED Notes (Signed)
Pt demanding to leave.  States since they wont let him go downstairs, hes ready to walk out.  Pt wants to speak to Dr. Toni Amendlapacs immed for discharge.  Pt states he will sleep on the street or under a bridge if necessary, but does not want to stay any longer.  Pt advised if he attempts to leave, he will be subdued by security.  Pt told it was very cold out and to stay the night since he already had his sleep meds and speak to Dr. Toni Amendlapacs in the morning about discharge.  Pt agreed, and asked for sandwich tray.  Pt given sandwich and drink, resting in his room.

## 2017-10-01 NOTE — ED Notes (Signed)
Patient refused lunch

## 2017-10-01 NOTE — ED Provider Notes (Signed)
-----------------------------------------   6:23 AM on 10/01/2017 -----------------------------------------   Blood pressure 113/68, pulse 62, temperature 98.2 F (36.8 C), resp. rate 16, height 6\' 2"  (1.88 m), weight 97.1 kg (214 lb), SpO2 98 %.  The patient had no acute events since last update.  Sleeping at this time.  Disposition is pending Psychiatry/Behavioral Medicine team recommendations.     Irean HongSung, Jade J, MD 10/01/17 [phone]

## 2017-10-01 NOTE — ED Notes (Signed)
Patient assigned to appropriate care area. Patient oriented to unit/care area: Informed that, for their safety, care areas are designed for safety and monitored by security cameras at all times; and visiting hours explained to patient. Patient verbalizes understanding, and verbal contract for safety obtained. 

## 2017-10-01 NOTE — BH Assessment (Addendum)
This Clinical research associatewriter called [facility] and follow up with Onalee Huaavid about pt's referral. Per Onalee Huaavid, no beds are available and client on a waiting list.    This writer followed up with the following hospitals to check the status of patient's referral :    [facility] [phone] or [phone]): per Gala Murdochanisha patient denied due to acuity  [facility] [phone]):  Per Darlene patient referral hasn't been reviewed.   [facility] [phone]): per Johann CapersLaTanya patient denied due to unstable diabetes.  [facility] [phone]): per Jill AlexandersJustin denied due to medical acuity   [facility] [phone]): per Wilkie AyeKristy patient denied due to no insurance   [facility] [phone]): per Thayer Ohmhris referred not in the system and needs to be resent  [facility] [phone]: left voicemail for nurse Amanda Peaoni Joseph for return call.

## 2017-10-01 NOTE — ED Notes (Signed)
Pt given meal tray.

## 2017-10-01 NOTE — ED Notes (Signed)
Pt given last diet coke and verbalized understanding.

## 2017-10-01 NOTE — Consult Note (Signed)
[name] Face-to-Face Psychiatry Consult   Reason for Consult: Consult follow-up 31 year old man with history of multiple medical issues as well as severe social deficits. Referring Physician: Joni Fears Patient Identification: Mark [name] MRN:  [id number] Principal Diagnosis: MDD (major depressive disorder) Diagnosis:   Patient Active Problem List   Diagnosis Date Noted  . Personality disorder ([place]) [F60.9] 09/26/2017  . 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 Obstructive hydrocephalus [G91.1] 09/19/2017  . Diabetes ([place]) [E11.9] 09/11/2017  . Hypoglycemia [E16.2] 09/09/2017  . MDD (major depressive disorder) [F32.9] 08/22/2017  . 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 Nausea & vomiting [R11.2] 08/18/2017  . Abdominal pain [R10.9] 08/18/2017  . Hyponatremia [E87.1] 08/18/2017  . DKA (diabetic ketoacidoses) ([place]) [E13.10] 11/24/2015  . Diabetic keto-acidosis ([employer]) [E13.10] 10/23/2015    Total Time spent with patient: 30 minutes  Subjective:   Mark [name] is a 31 y.o. male patient admitted with "I guess I am okay".  HPI: Patient interviewed chart reviewed.  See previous notes.  This is a follow-up for this 31 year old man with a history of multiple medical problems and severe social deficits.  He has no place to live no family support and no financing at all.  He has known severe medical problems including his diabetes and hydrocephalus.  Today the patient is not specifically describing depression although he says he still does not feel all that good.  He is not making any threats to kill himself.  Does not appear to be psychotic.  Has been cooperative with treatment more.  Updated discussion: Today the psychiatric services committee for review of difficult dispositions met to discuss this patient.  Several psychiatrists on the service all of whom are familiar with the patient's case.  There was agreement that this patient has benefited as  much as is likely to happen from inpatient psychiatric treatment and at this point is primarily in need of placement at outpatient care.  The decision not to admit him to the psychiatric ward was confirmed and endorsed by everyone at the meeting.  Past Psychiatric History: Patient has a history of self injury of suicidal threats of mood instability also of some cognitive impairment probably at least in part related to his hydrocephalus  Risk to Self: Suicidal Ideation: Yes-Currently Present Suicidal Intent: Yes-Currently Present Is patient at risk for suicide?: Yes Suicidal Plan?: Yes-Currently Present Specify Current Suicidal Plan: not take his insulin Access to Means: Yes Specify Access to Suicidal Means: he can stop taking his insulin.  He is diabetic. What has been your use of drugs/alcohol within the last 12 months?: none How many times?: 5 Other Self Harm Risks: none Triggers for Past Attempts: Unpredictable Intentional Self Injurious Behavior: Cutting Comment - Self Injurious Behavior: cut self in a suicide attempt Risk to Others: Homicidal Ideation: No Thoughts of Harm to Others: No Current Homicidal Intent: No Current Homicidal Plan: No Access to Homicidal Means: No Identified Victim: NA History of harm to others?: No Assessment of Violence: None Noted Violent Behavior Description: none Does patient have access to weapons?: No Criminal Charges Pending?: No Does patient have a court date: No Prior Inpatient Therapy: Prior Inpatient Therapy: Yes Prior Therapy Dates: numerous, most recent November 2018 Prior Therapy Facilty/Provider(s): [place], [facility], [facility] Reason for Treatment: depression and psychosis Prior Outpatient Therapy: Prior Outpatient Therapy: Yes Prior Therapy Dates: current Prior Therapy Facilty/Provider(s): [facility] Reason for Treatment: schizoaffective disorder Does patient have an ACCT team?: No Does patient have Intensive In-House  Services?  : No Does  patient have [facility] services? : No Does patient have P4CC services?: No  Past Medical History:  Past Medical History:  Diagnosis Date  . Diabetes mellitus without complication ([place])   . Hydrocephalus   . Kidney stones     Past Surgical History:  Procedure Laterality Date  . KIDNEY STONE SURGERY     Family History:  Family History  Problem Relation Age of Onset  . Breast cancer Mother    Family Psychiatric  History: Unknown Social History:  Social History   Substance and Sexual Activity  Alcohol Use No   Comment: ocasional      Social History   Substance and Sexual Activity  Drug Use Yes  . Types: Marijuana   Comment: "as often as I can"    Social History   Socioeconomic History  . Marital status: Single    Spouse name: None  . Number of children: None  . Years of education: None  . Highest education level: None  Social Needs  . Financial resource strain: None  . Food insecurity - worry: None  . Food insecurity - inability: None  . Transportation needs - medical: None  . Transportation needs - non-medical: None  Occupational History  . None  Tobacco Use  . Smoking status: Current Every Day Smoker    Packs/day: 1.00    Types: Cigarettes  . Smokeless tobacco: Never Used  Substance and Sexual Activity  . Alcohol use: No    Comment: ocasional   . Drug use: Yes    Types: Marijuana    Comment: "as often as I can"  . Sexual activity: None  Other Topics Concern  . None  Social History Narrative  . None   Additional Social History:    Allergies:   Allergies  Allergen Reactions  . Penicillins Anaphylaxis, Hives and Swelling    Has patient had a PCN reaction causing immediate rash, facial/tongue/throat swelling, SOB or lightheadedness with hypotension: Yes Has patient had a PCN reaction causing severe rash involving mucus membranes or skin necrosis: No Has patient had a PCN reaction that required hospitalization Yes Has patient had a PCN reaction  occurring within the last 10 years: Yes If all of the above answers are "NO", then may proceed with Cephalosporin use.  . Sulfa Antibiotics Anaphylaxis and Hives  . Clindamycin/Lincomycin Hives    Labs:  Results for orders placed or performed during the hospital encounter of 09/26/17 (from the past 48 hour(s))  Glucose, capillary     Status: Abnormal   Collection Time: 09/29/17  6:14 PM  Result Value Ref Range   Glucose-Capillary >600 (HH) 65 - 99 mg/dL  Glucose, capillary     Status: Abnormal   Collection Time: 09/29/17  7:33 PM  Result Value Ref Range   Glucose-Capillary 398 (H) 65 - 99 mg/dL  Glucose, capillary     Status: Abnormal   Collection Time: 09/29/17 10:04 PM  Result Value Ref Range   Glucose-Capillary 329 (H) 65 - 99 mg/dL  Glucose, capillary     Status: Abnormal   Collection Time: 09/30/17  8:27 AM  Result Value Ref Range   Glucose-Capillary 223 (H) 65 - 99 mg/dL  Glucose, capillary     Status: Abnormal   Collection Time: 09/30/17 12:38 PM  Result Value Ref Range   Glucose-Capillary 133 (H) 65 - 99 mg/dL  Glucose, capillary     Status: None   Collection Time: 10/01/17 10:27 AM  Result Value  Ref Range   Glucose-Capillary 82 65 - 99 mg/dL   Comment 1 Notify RN   Glucose, capillary     Status: Abnormal   Collection Time: 10/01/17  2:09 PM  Result Value Ref Range   Glucose-Capillary 195 (H) 65 - 99 mg/dL   Comment 1 Notify RN     Current Facility-Administered Medications  Medication Dose Route Frequency Provider Last Rate Last Dose  . chlorproMAZINE (THORAZINE) tablet 25 mg  25 mg Oral Once Orbie Pyo, MD      . clonazePAM Bobbye [place]) tablet 0.5 mg  0.5 mg Oral QHS Orbie Pyo, MD   0.5 mg at 09/30/17 2118  . gabapentin (NEURONTIN) capsule 1,200 mg  1,200 mg Oral TID Zakeria Kulzer, Madie Reno, MD   1,200 mg at 10/01/17 1625  . insulin aspart (novoLOG) injection 6 Units  6 Units Subcutaneous TID WC Maison Kestenbaum, Madie Reno, MD   6 Units at 10/01/17 1437   . insulin glargine (LANTUS) injection 38 Units  38 Units Subcutaneous Daily Mariela Rex, Madie Reno, MD   38 Units at 09/30/17 2156  . LORazepam (ATIVAN) tablet 2 mg  2 mg Oral Once Darel Hong, MD      . mirtazapine (REMERON) tablet 45 mg  45 mg Oral QHS Naleah Kofoed T, MD      . QUEtiapine (SEROQUEL) tablet 100 mg  100 mg Oral TID Jhamir Pickup, Madie Reno, MD   100 mg at 10/01/17 1625  . traZODone (DESYREL) tablet 100 mg  100 mg Oral QHS Jeanell Mangan, Madie Reno, MD   100 mg at 09/30/17 2120  . venlafaxine XR (EFFEXOR-XR) 24 hr capsule 300 mg  300 mg Oral Q breakfast Valia Wingard T, MD   300 mg at 10/01/17 1135   Current Outpatient Medications  Medication Sig Dispense Refill  . gabapentin (NEURONTIN) 400 MG capsule Take 3 capsules (1,200 mg total) by mouth 3 (three) times daily. 270 capsule 1  . haloperidol (HALDOL) 5 MG tablet Take 5 mg by mouth 2 (two) times daily.    . insulin aspart (NOVOLOG) 100 UNIT/ML injection Inject 6 Units into the skin 3 (three) times daily with meals. 10 mL 11  . insulin glargine (LANTUS) 100 UNIT/ML injection Inject 0.38 mLs (38 Units total) into the skin every morning. 11.4 mL 0  . mirtazapine (REMERON) 30 MG tablet Take 30 mg by mouth at bedtime.    . traZODone (DESYREL) 100 MG tablet Take 1 tablet (100 mg total) by mouth at bedtime as needed for sleep. 30 tablet 2  . venlafaxine XR (EFFEXOR-XR) 75 MG 24 hr capsule Take 225 mg by mouth daily with breakfast.    . zolpidem (AMBIEN) 10 MG tablet Take 10 mg by mouth at bedtime as needed for sleep.    . blood glucose meter kit and supplies KIT Dispense based on patient and insurance preference. Use up to four times daily as directed. (FOR ICD-9 250.00, 250.01). 1 each 0  . haloperidol decanoate (HALDOL DECANOATE) 100 MG/ML injection Inject 1 mL (100 mg total) into the muscle every 28 (twenty-eight) days. (Patient not taking: Reported on 09/28/2017) 1 mL 1  . venlafaxine XR (EFFEXOR-XR) 150 MG 24 hr capsule Take 2 capsules (300 mg total)  by mouth daily with breakfast. (Patient not taking: Reported on 09/28/2017) 60 capsule 1    Musculoskeletal: Strength & Muscle Tone: within normal limits Gait & Station: unsteady Patient leans: N/A  Psychiatric Specialty Exam: Physical Exam  Nursing note and vitals reviewed. Constitutional: He appears  well-developed and well-nourished.  HENT:  Head: Normocephalic and atraumatic.  Eyes: Conjunctivae are normal. Pupils are equal, round, and reactive to light.  Neck: Normal range of motion.  Cardiovascular: Regular rhythm and normal heart sounds.  Respiratory: Effort normal. No respiratory distress.  GI: Soft.  Musculoskeletal: Normal range of motion.  Neurological: He is alert.  Skin: Skin is warm and dry.  Psychiatric: His affect is blunt. His speech is delayed. He is slowed. Thought content is not paranoid. Cognition and memory are impaired. He expresses impulsivity. He expresses no homicidal and no suicidal ideation.    Review of Systems  Constitutional: Negative.   HENT: Negative.   Eyes: Negative.   Respiratory: Negative.   Cardiovascular: Negative.   Gastrointestinal: Negative.   Musculoskeletal: Negative.   Skin: Negative.   Neurological: Negative.   Psychiatric/Behavioral: Positive for depression and memory loss. Negative for hallucinations, substance abuse and suicidal ideas. The patient is not nervous/anxious and does not have insomnia.     Blood pressure 113/75, pulse 82, temperature 98 F (36.7 C), temperature source Oral, resp. rate 16, height 6' 2"  (1.88 m), weight 97.1 kg (214 lb), SpO2 98 %.Body mass index is 27.48 kg/m.  General Appearance: Disheveled  Eye Contact:  Fair  Speech:  Slow  Volume:  Decreased  Mood:  Euthymic  Affect:  Constricted  Thought Process:  Goal Directed  Orientation:  Full (Time, Place, and Person)  Thought Content:  Logical  Suicidal Thoughts:  No  Homicidal Thoughts:  No  Memory:  Immediate;   Fair Recent;   Fair Remote;    Fair  Judgement:  Impaired  Insight:  Shallow  Psychomotor Activity:  Decreased  Concentration:  Concentration: Poor  Recall:  [employer] of Knowledge:  Fair  Language:  Fair  Akathisia:  No  Handed:  Right  AIMS (if indicated):     Assets:  Desire for Improvement  ADL's:  Impaired  Cognition:  Impaired,  Mild  Sleep:        Treatment Plan Summary: Medication management and Plan Yesterday I had added Seroquel to the patient's medication because of some agitated behavior.  He appears to be tolerating this and has calm down today.  Not expressing acute depression or suicidality.  Blood sugars also appear to be improved today.  At this point I think that the patient is no longer in need of psychiatric admission.  So far we have had no success in referrals to outpatient facilities.  I am going to discontinue the involuntary commitment and we will continue to try to work with social work on some kind of placement options.  Disposition: Patient does not meet criteria for psychiatric inpatient admission. Supportive therapy provided about ongoing stressors.  Alethia Berthold, MD 10/01/2017 5:39 PM

## 2017-10-01 NOTE — ED Notes (Signed)
Pt given diet coke. 

## 2017-10-02 ENCOUNTER — Other Ambulatory Visit: Payer: Self-pay

## 2017-10-02 LAB — GLUCOSE, CAPILLARY
GLUCOSE-CAPILLARY: 175 mg/dL — AB (ref 65–99)
Glucose-Capillary: 103 mg/dL — ABNORMAL HIGH (ref 65–99)
Glucose-Capillary: 97 mg/dL (ref 65–99)
Glucose-Capillary: 117 mg/dL — ABNORMAL HIGH (ref 65–99)

## 2017-10-02 MED ORDER — CLONAZEPAM 0.5 MG PO TABS
ORAL_TABLET | ORAL | Status: AC
  Filled 2017-10-02: qty 1

## 2017-10-02 MED ORDER — CLONAZEPAM 0.5 MG PO TABS
0.5000 mg | ORAL_TABLET | Freq: Three times a day (TID) | ORAL | Status: DC
  Administered 2017-10-02 – 2017-10-03 (×4): 0.5 mg via ORAL
  Filled 2017-10-02 (×3): qty 1

## 2017-10-02 MED ORDER — NICOTINE 21 MG/24HR TD PT24
21.0000 mg | MEDICATED_PATCH | Freq: Once | TRANSDERMAL | Status: AC
  Administered 2017-10-02: 21 mg via TRANSDERMAL
  Filled 2017-10-02: qty 1

## 2017-10-02 NOTE — ED Notes (Signed)
Breakfast tray given to patient.

## 2017-10-02 NOTE — ED Notes (Signed)
Pt standing in doorway. This tech ask pt if he needed anything and he stated he needs to see his nurse. I informed pt that nurse was getting report and it would be awhile and maybe I could help him. Pt refused to tell me what he wanted to talk to the RN about. Pt still standing in doorway staring at the nurses. Morrie SheldonAshley, RN is talking ot pt at this time.

## 2017-10-02 NOTE — ED Notes (Signed)
Pt given supper tray.

## 2017-10-02 NOTE — ED Notes (Signed)
DIET GINGER WAS GIVEN TO PATIENT.

## 2017-10-02 NOTE — ED Notes (Signed)
Pt requesting nicotine patch, new order obtained from Dr. Cyril LoosenKinner. Pt had old patch on and was removed.

## 2017-10-02 NOTE — ED Notes (Signed)
Pt ambulated to bathroom with assistance.

## 2017-10-02 NOTE — ED Notes (Signed)
Pt given additional pb and grahams.

## 2017-10-02 NOTE — ED Provider Notes (Signed)
-----------------------------------------   2:35 AM on 10/02/2017 -----------------------------------------   Blood pressure 113/75, pulse 82, temperature 98 F (36.7 C), temperature source Oral, resp. rate 16, height 6\' 2"  (1.88 m), weight 97.1 kg (214 lb), SpO2 98 %.  The patient had no acute events since last update.  Calm and cooperative at this time.  Disposition is pending Psychiatry/Behavioral Medicine team recommendations.     Merrily Brittleifenbark, Aymen Widrig, MD 10/02/17 [phone]

## 2017-10-02 NOTE — ED Notes (Signed)
Pt requesting admission downstairs, readvised of DR Clapacs plan of care.

## 2017-10-02 NOTE — ED Notes (Signed)
Pt ambulated to the bathroom with the help of Misty StanleyLisa, ED Tech and returned to his bed without difficulty.

## 2017-10-02 NOTE — ED Notes (Signed)
Pt requesting [place] sandwich tray, reminded of conversation earlier regarding dinner.   Pt offered crackers and PB and was given.

## 2017-10-02 NOTE — ED Notes (Signed)
Went to talk to patient who was standing in the doorway, pt asking about his Klonopin, informed him that I asked the MD about it and it is up to the provider to make the decision. Pt was cooperative and calm and sat back down on the bed.  Informed him that the next shift nurse would be giving him his dose of Gabapentin this afternoon.

## 2017-10-02 NOTE — ED Notes (Signed)
MD Clapacs meeting with pt per pt request for additional meds.

## 2017-10-02 NOTE — Consult Note (Signed)
[facility] Consult   Reason for Consult: This is a follow-up note for this 31 year old man with a history of hydrocephalus with cognitive impairment mood symptoms irritability and reports of suicidal ideation. Referring Physician: Joni Fears Patient Identification: Mark [name] MRN:  [id number] Principal Diagnosis: Obstructive hydrocephalus Diagnosis:   Patient Active Problem List   Diagnosis Date Noted  . Personality disorder ([place]) [F60.9] 09/26/2017  . 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 Obstructive hydrocephalus [G91.1] 09/19/2017  . Diabetes ([place]) [E11.9] 09/11/2017  . Hypoglycemia [E16.2] 09/09/2017  . MDD (major depressive disorder) [F32.9] 08/22/2017  . 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 Nausea & vomiting [R11.2] 08/18/2017  . Abdominal pain [R10.9] 08/18/2017  . Hyponatremia [E87.1] 08/18/2017  . DKA (diabetic ketoacidoses) ([place]) [E13.10] 11/24/2015  . Diabetic keto-acidosis ([place]) [E13.10] 10/23/2015    Total Time spent with patient: 30 minutes  Subjective:   ESTEPHAN [name] is a 31 y.o. male patient admitted with "I am getting pretty irritated".  HPI: Patient interviewed chart reviewed.  Case reviewed with nursing as well.  31 year old man with medical issues including uncontrolled diabetes and untreated hydrocephalus as well as multiple major social losses.  He is currently in the emergency room after returning almost immediately from his most recent psychiatric discharge.  See previous notes.  Patient's presentation today does not seem greatly different from what he has had on other previous encounters.  Multiple members of the psychiatric team have discussed his case and agreed that inpatient hospitalization is not likely to add any more benefit or lead to any better outcome.  This unfortunate man mostly needs a safe and secure place to stay and be taken care of.  Unfortunately he remains irritable and  makes suicidal threats which clearly seem to come and go in intensity depending on his immediate demands and how well they are being met.  Today he is insisting that he should be given more clonazepam.  Social history: Documented previously.  Family all deceased or estranged.  No income no financial support  Past Psychiatric History: Patient does have a history of self injury but also a history mostly of poor management of his diabetes and suicidal threats when his mood is irritated and his needs are not being met  Risk to Self: Suicidal Ideation: Yes-Currently Present Suicidal Intent: Yes-Currently Present Is patient at risk for suicide?: Yes Suicidal Plan?: Yes-Currently Present Specify Current Suicidal Plan: not take his insulin Access to Means: Yes Specify Access to Suicidal Means: he can stop taking his insulin.  He is diabetic. What has been your use of drugs/alcohol within the last 12 months?: none How many times?: 5 Other Self Harm Risks: none Triggers for Past Attempts: Unpredictable Intentional Self Injurious Behavior: Cutting Comment - Self Injurious Behavior: cut self in a suicide attempt Risk to Others: Homicidal Ideation: No Thoughts of Harm to Others: No Current Homicidal Intent: No Current Homicidal Plan: No Access to Homicidal Means: No Identified Victim: NA History of harm to others?: No Assessment of Violence: None Noted Violent Behavior Description: none Does patient have access to weapons?: No Criminal Charges Pending?: No Does patient have a court date: No Prior Inpatient Therapy: Prior Inpatient Therapy: Yes Prior Therapy Dates: numerous, most recent November 2018 Prior Therapy Facilty/Provider(s): [facility], [facility], [facility] Reason for Treatment: depression and psychosis Prior Outpatient Therapy: Prior Outpatient Therapy: Yes Prior Therapy Dates: current Prior Therapy Facilty/Provider(s): [facility] Reason for Treatment: schizoaffective disorder Does patient have  an ACCT  team?: No Does patient have Intensive In-House Services?  : No Does patient have [facility] services? : No Does patient have P4CC services?: No  Past Medical History:  Past Medical History:  Diagnosis Date  . Diabetes mellitus without complication ([place])   . Hydrocephalus   . Kidney stones     Past Surgical History:  Procedure Laterality Date  . KIDNEY STONE SURGERY     Family History:  Family History  Problem Relation Age of Onset  . Breast cancer Mother    Family Psychiatric  History: None known Social History:  Social History   Substance and Sexual Activity  Alcohol Use No   Comment: ocasional      Social History   Substance and Sexual Activity  Drug Use Yes  . Types: Marijuana   Comment: "as often as I can"    Social History   Socioeconomic History  . Marital status: Single    Spouse name: None  . Number of children: None  . Years of education: None  . Highest education level: None  Social Needs  . Financial resource strain: None  . Food insecurity - worry: None  . Food insecurity - inability: None  . Transportation needs - medical: None  . Transportation needs - non-medical: None  Occupational History  . None  Tobacco Use  . Smoking status: Current Every Day Smoker    Packs/day: 1.00    Types: Cigarettes  . Smokeless tobacco: Never Used  Substance and Sexual Activity  . Alcohol use: No    Comment: ocasional   . Drug use: Yes    Types: Marijuana    Comment: "as often as I can"  . Sexual activity: None  Other Topics Concern  . None  Social History Narrative  . None   Additional Social History:    Allergies:   Allergies  Allergen Reactions  . Penicillins Anaphylaxis, Hives and Swelling    Has patient had a PCN reaction causing immediate rash, facial/tongue/throat swelling, SOB or lightheadedness with hypotension: Yes Has patient had a PCN reaction causing severe rash involving mucus membranes or skin necrosis: No Has patient had a  PCN reaction that required hospitalization Yes Has patient had a PCN reaction occurring within the last 10 years: Yes If all of the above answers are "NO", then may proceed with Cephalosporin use.  . Sulfa Antibiotics Anaphylaxis and Hives  . Clindamycin/Lincomycin Hives    Labs:  Results for orders placed or performed during the hospital encounter of 09/26/17 (from the past 48 hour(s))  Glucose, capillary     Status: None   Collection Time: 10/01/17 10:27 AM  Result Value Ref Range   Glucose-Capillary 82 65 - 99 mg/dL   Comment 1 Notify RN   Glucose, capillary     Status: Abnormal   Collection Time: 10/01/17  2:09 PM  Result Value Ref Range   Glucose-Capillary 195 (H) 65 - 99 mg/dL   Comment 1 Notify RN   Glucose, capillary     Status: Abnormal   Collection Time: 10/01/17  6:09 PM  Result Value Ref Range   Glucose-Capillary 303 (H) 65 - 99 mg/dL   Comment 1 Notify RN    Comment 2 Document in Chart   Glucose, capillary     Status: Abnormal   Collection Time: 10/02/17  8:20 AM  Result Value Ref Range   Glucose-Capillary 175 (H) 65 - 99 mg/dL   Comment 1 Notify RN   Glucose, capillary  Status: Abnormal   Collection Time: 10/02/17  1:40 PM  Result Value Ref Range   Glucose-Capillary 103 (H) 65 - 99 mg/dL   Comment 1 Notify RN   Glucose, capillary     Status: None   Collection Time: 10/02/17  5:10 PM  Result Value Ref Range   Glucose-Capillary 97 65 - 99 mg/dL    Current Facility-Administered Medications  Medication Dose Route Frequency Provider Last Rate Last Dose  . chlorproMAZINE (THORAZINE) tablet 25 mg  25 mg Oral Once Orbie Pyo, MD      . clonazePAM Bobbye [place]) tablet 0.5 mg  0.5 mg Oral TID Jianni Batten, Madie Reno, MD   0.5 mg at 10/02/17 1630  . gabapentin (NEURONTIN) capsule 1,200 mg  1,200 mg Oral TID Levada Bowersox T, MD   1,200 mg at 10/02/17 1630  . insulin aspart (novoLOG) injection 6 Units  6 Units Subcutaneous TID WC Alphus Zeck, Madie Reno, MD   6 Units at  10/02/17 1340  . insulin glargine (LANTUS) injection 38 Units  38 Units Subcutaneous Daily Manisha Cancel, Madie Reno, MD   38 Units at 10/01/17 2218  . LORazepam (ATIVAN) tablet 2 mg  2 mg Oral Once Darel Hong, MD      . mirtazapine (REMERON) tablet 45 mg  45 mg Oral QHS Ashlie Mcmenamy T, MD   45 mg at 10/01/17 2217  . nicotine (NICODERM CQ - dosed in mg/24 hours) patch 21 mg  21 mg Transdermal Once Lavonia Drafts, MD   21 mg at 10/02/17 1041  . QUEtiapine (SEROQUEL) tablet 100 mg  100 mg Oral TID Tameko Halder, Madie Reno, MD   100 mg at 10/02/17 1629  . traZODone (DESYREL) tablet 100 mg  100 mg Oral QHS Kaesen Rodriguez, Madie Reno, MD   100 mg at 10/01/17 2209  . venlafaxine XR (EFFEXOR-XR) 24 hr capsule 300 mg  300 mg Oral Q breakfast Canaan Prue T, MD   300 mg at 10/02/17 1003   Current Outpatient Medications  Medication Sig Dispense Refill  . gabapentin (NEURONTIN) 400 MG capsule Take 3 capsules (1,200 mg total) by mouth 3 (three) times daily. 270 capsule 1  . haloperidol (HALDOL) 5 MG tablet Take 5 mg by mouth 2 (two) times daily.    . insulin aspart (NOVOLOG) 100 UNIT/ML injection Inject 6 Units into the skin 3 (three) times daily with meals. 10 mL 11  . insulin glargine (LANTUS) 100 UNIT/ML injection Inject 0.38 mLs (38 Units total) into the skin every morning. 11.4 mL 0  . mirtazapine (REMERON) 30 MG tablet Take 30 mg by mouth at bedtime.    . traZODone (DESYREL) 100 MG tablet Take 1 tablet (100 mg total) by mouth at bedtime as needed for sleep. 30 tablet 2  . venlafaxine XR (EFFEXOR-XR) 75 MG 24 hr capsule Take 225 mg by mouth daily with breakfast.    . zolpidem (AMBIEN) 10 MG tablet Take 10 mg by mouth at bedtime as needed for sleep.    . blood glucose meter kit and supplies KIT Dispense based on patient and insurance preference. Use up to four times daily as directed. (FOR ICD-9 250.00, 250.01). 1 each 0  . haloperidol decanoate (HALDOL DECANOATE) 100 MG/ML injection Inject 1 mL (100 mg total) into the muscle  every 28 (twenty-eight) days. (Patient not taking: Reported on 09/28/2017) 1 mL 1  . venlafaxine XR (EFFEXOR-XR) 150 MG 24 hr capsule Take 2 capsules (300 mg total) by mouth daily with breakfast. (Patient not taking: Reported on  09/28/2017) 60 capsule 1    Musculoskeletal: Strength & Muscle Tone: within normal limits Gait & Station: normal Patient leans: N/A  Psychiatric Specialty Exam: Physical Exam  Nursing note and vitals reviewed. Constitutional: He appears well-developed and well-nourished.  HENT:  Head: Normocephalic and atraumatic.  Eyes: Conjunctivae are normal. Pupils are equal, round, and reactive to light.  Neck: Normal range of motion.  Cardiovascular: Regular rhythm and normal heart sounds.  Respiratory: Effort normal. No respiratory distress.  GI: Soft. He exhibits no distension.  Musculoskeletal: Normal range of motion.  Neurological: He is alert.  Skin: Skin is warm and dry.  Psychiatric: His affect is labile. His speech is tangential. He is agitated. Cognition and memory are impaired. He expresses impulsivity. He expresses suicidal ideation. He expresses no suicidal plans. He exhibits abnormal recent memory.    Review of Systems  Constitutional: Negative.   HENT: Negative.   Eyes: Negative.   Respiratory: Negative.   Cardiovascular: Negative.   Gastrointestinal: Negative.   Musculoskeletal: Negative.   Skin: Negative.   Neurological: Negative.   Psychiatric/Behavioral: Positive for depression, memory loss and suicidal ideas. Negative for hallucinations and substance abuse. The patient is nervous/anxious and has insomnia.     Blood pressure 118/80, pulse 80, temperature 98 F (36.7 C), temperature source Oral, resp. rate 18, height 6' 2"  (1.88 m), weight 97.1 kg (214 lb), SpO2 98 %.Body mass index is 27.48 kg/m.  General Appearance: Disheveled  Eye Contact:  Fair  Speech:  Slow  Volume:  Decreased  Mood:  Dysphoric  Affect:  Constricted  Thought Process:   Goal Directed  Orientation:  Full (Time, Place, and Person)  Thought Content:  Illogical and Rumination  Suicidal Thoughts:  Yes.  without intent/plan  Homicidal Thoughts:  No  Memory:  Immediate;   Fair Recent;   Fair Remote;   Fair  Judgement:  Impaired  Insight:  Shallow  Psychomotor Activity:  Restlessness  Concentration:  Concentration: Poor  Recall:  [employer] of Knowledge:  Fair  Language:  Fair  Akathisia:  No  Handed:  Right  AIMS (if indicated):     Assets:  Desire for Improvement  ADL's:  Intact  Cognition:  Impaired,  Mild  Sleep:        Treatment Plan Summary: Daily contact with patient to assess and evaluate symptoms and progress in treatment, Medication management and Plan I had started the patient on Seroquel the other day in hopes that that would tone down his mood and irritability but he appears to have rapidly gotten used to it and to still be irritated.  If anything I think this supports the assessment that inpatient hospitalization and psychiatric treatment is probably not likely to make for much long-term change in his presentation.  I am agreeable to increasing his dose of clonazepam to 1/2 mg 3 times a day which also may help with his constant agitation.  Once again multiple members of the psychiatric team of discussed the patient and are in agreement that inpatient hospitalization would be of no benefit and possibly contraindicated for this patient.  We are working on trying to find an appropriate placement for him given his unfortunate social situation.  Reviewed case with social work.  No other change besides increasing the Klonopin.  Disposition: Patient does not meet criteria for psychiatric inpatient admission. Supportive therapy provided about ongoing stressors.  Alethia Berthold, MD 10/02/2017 5:56 PM

## 2017-10-02 NOTE — ED Notes (Signed)
Pt would like to take a shower later tonight. Pt cooperative and calm and took medications without any issues.

## 2017-10-02 NOTE — ED Notes (Addendum)
Pt heard yelling in his room "someone come help me". Arrived at room, pt sitting on floor between bed and sink.  Pt states he fell getting back to the bed.  Pt states "I also fell in the bathroom". Pt observed ambulating unassisted to the bathroom several times today without difficulty.  Pt has non-slip socks on as well.  Pt also had 2 full cups of drink where he fell that were not spilled when patient was found.  Dr. Cyril LoosenKinner notified. Pt states he fell because of his neuropathy.  States "I want my meds" also.  I went to ask patient what meds he was looking for and states he needs gabapentin and also wants to get klonopin during the day as well not just at night.

## 2017-10-02 NOTE — ED Notes (Signed)
ED BHU PLACEMENT JUSTIFICATION Is the patient under IVC or is there intent for IVC: Yes.   Is the patient medically cleared: Yes.   Is there vacancy in the ED BHU: Yes.   Is the population mix appropriate for patient: Yes.   Is the patient awaiting placement in inpatient or outpatient setting: Yes.   Has the patient had a psychiatric consult: Yes.   Survey of unit performed for contraband, proper placement and condition of furniture, tampering with fixtures in bathroom, shower, and each patient room: Yes.  ; Findings: Environment Secured.  APPEARANCE/BEHAVIOR calm, cooperative and adequate rapport can be established NEURO ASSESSMENT Orientation: time, place and person Hallucinations: No.None noted (Hallucinations) Speech: Normal Gait: unsteady RESPIRATORY ASSESSMENT Normal expansion.  Clear to auscultation.  No rales, rhonchi, or wheezing., No chest wall tenderness., No kyphosis or scoliosis. CARDIOVASCULAR ASSESSMENT regular rate and rhythm, S1, S2 normal, no murmur, click, rub or gallop GASTROINTESTINAL ASSESSMENT soft, nontender, BS WNL, no r/g EXTREMITIES normal strength, tone, and muscle mass, no deformities, no erythema, induration, or nodules, no evidence of joint effusion, no evidence of joint instability PLAN OF CARE Provide calm/safe environment. Vital signs assessed twice daily. ED BHU Assessment once each 12-hour shift. Collaborate with intake RN daily or as condition indicates. Assure the ED provider has rounded once each shift. Provide and encourage hygiene. Provide redirection as needed. Assess for escalating behavior; address immediately and inform ED provider.  Assess family dynamic and appropriateness for visitation as needed: No.; If necessary, describe findings: family dynamics were  Educate the patient/family about BHU procedures/visitation: Yes.  ; If necessary, describe findings: Pt understands the rules.

## 2017-10-02 NOTE — Progress Notes (Signed)
Inpatient Diabetes Program Recommendations  AACE/ADA: New Consensus Statement on Inpatient Glycemic Control (2015)  Target Ranges:  Prepandial:   less than 140 mg/dL      Peak postprandial:   less than 180 mg/dL (1-2 hours)      Critically ill patients:  140 - 180 mg/dL   Results for Derek JackWILSON, Martice Q (MRN [id number]) as of 10/02/2017 09:51  Ref. Range 10/01/2017 10:27 10/01/2017 14:09 10/01/2017 18:09  Glucose-Capillary Latest Ref Range: 65 - 99 mg/dL 82 562195 (H) 130303 (H)   Results for Derek JackWILSON, Labaron Q (MRN [id number]) as of 10/02/2017 09:51  Ref. Range 10/02/2017 08:20  Glucose-Capillary Latest Ref Range: 65 - 99 mg/dL 295175 (H)    Home DM Meds: Lantus 38 units daily                             Novolog 6 units TID  Current Insulin Orders: Lantus 38 units daily                                       Novolog 6 units TID      MD- Please consider starting Novolog Moderate Correction Scale/ SSI (0-15 units) TID AC + HS while patient holding in ED.  Use Glycemic Control Order set    --Will follow patient during hospitalization--  Ambrose FinlandJeannine Johnston Autymn Omlor RN, MSN, CDE Diabetes Coordinator Inpatient Glycemic Control Team Team Pager: [phone] (8a-5p)

## 2017-10-02 NOTE — ED Notes (Signed)
Pt requesting additional doses of klonopin during the day, informed Dr. Cyril LoosenKinner.

## 2017-10-02 NOTE — ED Notes (Addendum)
Pt standing in doorway, asked to return to room by tech. Pt refused, begins hitting head on doorway, redirected to bed by this RN, instructed that must not harm self if requesting additional accommodations from psych MD, it is our obligation to keep pt safe.

## 2017-10-02 NOTE — ED Notes (Addendum)
Pt threw dinner away, when asked by this RN "because I don't want to eat that". This RN informed pt there will be no more meal trays until breakfast, pt verbalizes understanding.

## 2017-10-03 LAB — GLUCOSE, CAPILLARY
GLUCOSE-CAPILLARY: 152 mg/dL — AB (ref 65–99)
GLUCOSE-CAPILLARY: 153 mg/dL — AB (ref 65–99)
GLUCOSE-CAPILLARY: 382 mg/dL — AB (ref 65–99)

## 2017-10-03 NOTE — ED Notes (Signed)
Offered patient shower patient states he will take one after lunch.

## 2017-10-03 NOTE — ED Notes (Signed)
BEHAVIORAL HEALTH ROUNDING Patient sleeping: No. Patient alert and oriented: yes Behavior appropriate: Yes.  ; If no, describe:  Nutrition and fluids offered: yes Toileting and hygiene offered: Yes  Sitter present: q15 minute observations and security  monitoring Law enforcement present: Yes  ODS  

## 2017-10-03 NOTE — ED Notes (Signed)
I awakened him from sleep to check his CBG - introduced myself to him  NAD assessed   [facility]   Continue to monitor

## 2017-10-03 NOTE — Consult Note (Signed)
[facility]   Reason for Consult: Consult for patient with chronic mood instability and multiple medical problems remaining in the emergency room Referring Physician: Joni Fears Patient Identification: Mark [name] MRN:  [id number] Principal Diagnosis: Obstructive hydrocephalus Diagnosis:   Patient Active Problem List   Diagnosis Date Noted  . Personality disorder (Mark [name]) [F60.9] 09/26/2017  . 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 Obstructive hydrocephalus [G91.1] 09/19/2017  . Diabetes (Mark [name]) [E11.9] 09/11/2017  . Hypoglycemia [E16.2] 09/09/2017  . MDD (major depressive disorder) [F32.9] 08/22/2017  . 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 Nausea & vomiting [R11.2] 08/18/2017  . Abdominal pain [R10.9] 08/18/2017  . Hyponatremia [E87.1] 08/18/2017  . DKA (diabetic ketoacidoses) (Mark [name]) [E13.10] 11/24/2015  . Diabetic keto-acidosis (Mark [name]) [E13.10] 10/23/2015    Total Time spent with patient: 30 minutes  Subjective:   Mark [name] is a 31 y.o. male patient admitted with "I am okay I guess".  HPI: Patient seen today chart reviewed.  Case reviewed with social work and emergency room staff.  31 year old man with a history of some degree of poor cognitive functioning hydrocephalus diabetes and severe social impairment patient has been in the emergency room for several days because of a lack of appropriate resources for disposition.  Today the patient has no new complaints.  He tells me that he still feels angry and suicidal but his affect actually seemed calmer and more appropriate today.  Past Psychiatric History: Patient has a history of mood instability.  History of suicidal threats.  History of poor self-care  Risk to Self: Suicidal Ideation: Yes-Currently Present Suicidal Intent: Yes-Currently Present Is patient at risk for suicide?: Yes Suicidal Plan?: Yes-Currently Present Specify Current Suicidal Plan: not  take his insulin Access to Means: Yes Specify Access to Suicidal Means: he can stop taking his insulin.  He is diabetic. What has been your use of drugs/alcohol within the last 12 months?: none How many times?: 5 Other Self Harm Risks: none Triggers for Past Attempts: Unpredictable Intentional Self Injurious Behavior: Cutting Comment - Self Injurious Behavior: cut self in a suicide attempt Risk to Others: Homicidal Ideation: No Thoughts of Harm to Others: No Current Homicidal Intent: No Current Homicidal Plan: No Access to Homicidal Means: No Identified Victim: NA History of harm to others?: No Assessment of Violence: None Noted Violent Behavior Description: none Does patient have access to weapons?: No Criminal Charges Pending?: No Does patient have a court date: No Prior Inpatient Therapy: Prior Inpatient Therapy: Yes Prior Therapy Dates: numerous, most recent November 2018 Prior Therapy Facilty/Provider(s): [facility], [facility], [facility] Reason for Treatment: depression and psychosis Prior Outpatient Therapy: Prior Outpatient Therapy: Yes Prior Therapy Dates: current Prior Therapy Facilty/Provider(s): [facility] Reason for Treatment: schizoaffective disorder Does patient have an ACCT team?: No Does patient have Intensive In-House Services?  : No Does patient have [facility] services? : No Does patient have P4CC services?: No  Past Medical History:  Past Medical History:  Diagnosis Date  . Diabetes mellitus without complication (Mark [name])   . Hydrocephalus   . Kidney stones     Past Surgical History:  Procedure Laterality Date  . KIDNEY STONE SURGERY     Family History:  Family History  Problem Relation Age of Onset  . Breast cancer Mother    Family Psychiatric  History: None Social History:  Social History   Substance and Sexual Activity  Alcohol Use No   Comment: ocasional      Social History  Substance and Sexual Activity  Drug Use Yes  . Types: Marijuana   Comment:  "as often as I can"    Social History   Socioeconomic History  . Marital status: Single    Spouse name: None  . Number of children: None  . Years of education: None  . Highest education level: None  Social Needs  . Financial resource strain: None  . Food insecurity - worry: None  . Food insecurity - inability: None  . Transportation needs - medical: None  . Transportation needs - non-medical: None  Occupational History  . None  Tobacco Use  . Smoking status: Current Every Day Smoker    Packs/day: 1.00    Types: Cigarettes  . Smokeless tobacco: Never Used  Substance and Sexual Activity  . Alcohol use: No    Comment: ocasional   . Drug use: Yes    Types: Marijuana    Comment: "as often as I can"  . Sexual activity: None  Other Topics Concern  . None  Social History Narrative  . None   Additional Social History:    Allergies:   Allergies  Allergen Reactions  . Penicillins Anaphylaxis, Hives and Swelling    Has patient had a PCN reaction causing immediate rash, facial/tongue/throat swelling, SOB or lightheadedness with hypotension: Yes Has patient had a PCN reaction causing severe rash involving mucus membranes or skin necrosis: No Has patient had a PCN reaction that required hospitalization Yes Has patient had a PCN reaction occurring within the last 10 years: Yes If all of the above answers are "NO", then may proceed with Cephalosporin use.  . Sulfa Antibiotics Anaphylaxis and Hives  . Clindamycin/Lincomycin Hives    Labs:  Results for orders placed or performed during the hospital encounter of 09/26/17 (from the past 48 hour(s))  Glucose, capillary     Status: Abnormal   Collection Time: 10/02/17  8:20 AM  Result Value Ref Range   Glucose-Capillary 175 (H) 65 - 99 mg/dL   Comment 1 Notify RN   Glucose, capillary     Status: Abnormal   Collection Time: 10/02/17  1:40 PM  Result Value Ref Range   Glucose-Capillary 103 (H) 65 - 99 mg/dL   Comment 1 Notify RN    Glucose, capillary     Status: None   Collection Time: 10/02/17  5:10 PM  Result Value Ref Range   Glucose-Capillary 97 65 - 99 mg/dL  Glucose, capillary     Status: Abnormal   Collection Time: 10/02/17  9:35 PM  Result Value Ref Range   Glucose-Capillary 117 (H) 65 - 99 mg/dL  Glucose, capillary     Status: Abnormal   Collection Time: 10/03/17  7:11 AM  Result Value Ref Range   Glucose-Capillary 152 (H) 65 - 99 mg/dL  Glucose, capillary     Status: Abnormal   Collection Time: 10/03/17 12:25 PM  Result Value Ref Range   Glucose-Capillary 153 (H) 65 - 99 mg/dL  Glucose, capillary     Status: Abnormal   Collection Time: 10/03/17  6:09 PM  Result Value Ref Range   Glucose-Capillary 382 (H) 65 - 99 mg/dL    Current Facility-Administered Medications  Medication Dose Route Frequency Provider Last Rate Last Dose  . chlorproMAZINE (THORAZINE) tablet 25 mg  25 mg Oral Once Orbie Pyo, MD      . clonazePAM Bobbye Charleston) tablet 0.5 mg  0.5 mg Oral TID Alianis Trimmer, Madie Reno, MD   0.5 mg at 10/03/17 1811  .  gabapentin (NEURONTIN) capsule 1,200 mg  1,200 mg Oral TID Emmalene Kattner, Madie Reno, MD   1,200 mg at 10/03/17 1809  . insulin aspart (novoLOG) injection 6 Units  6 Units Subcutaneous TID WC Roselia Snipe, Madie Reno, MD   6 Units at 10/03/17 1810  . insulin glargine (LANTUS) injection 38 Units  38 Units Subcutaneous Daily Forrest Jaroszewski, Madie Reno, MD   38 Units at 10/02/17 2131  . LORazepam (ATIVAN) tablet 2 mg  2 mg Oral Once Darel Hong, MD      . mirtazapine (REMERON) tablet 45 mg  45 mg Oral QHS Raeanna Soberanes, Madie Reno, MD   45 mg at 10/02/17 2129  . QUEtiapine (SEROQUEL) tablet 100 mg  100 mg Oral TID Sheral Pfahler, Madie Reno, MD   100 mg at 10/03/17 1811  . traZODone (DESYREL) tablet 100 mg  100 mg Oral QHS Keyari Kleeman, Madie Reno, MD   100 mg at 10/02/17 2128  . venlafaxine XR (EFFEXOR-XR) 24 hr capsule 300 mg  300 mg Oral Q breakfast Brave Dack T, MD   300 mg at 10/03/17 1216   Current Outpatient Medications  Medication  Sig Dispense Refill  . gabapentin (NEURONTIN) 400 MG capsule Take 3 capsules (1,200 mg total) by mouth 3 (three) times daily. 270 capsule 1  . haloperidol (HALDOL) 5 MG tablet Take 5 mg by mouth 2 (two) times daily.    . insulin aspart (NOVOLOG) 100 UNIT/ML injection Inject 6 Units into the skin 3 (three) times daily with meals. 10 mL 11  . insulin glargine (LANTUS) 100 UNIT/ML injection Inject 0.38 mLs (38 Units total) into the skin every morning. 11.4 mL 0  . mirtazapine (REMERON) 30 MG tablet Take 30 mg by mouth at bedtime.    . traZODone (DESYREL) 100 MG tablet Take 1 tablet (100 mg total) by mouth at bedtime as needed for sleep. 30 tablet 2  . venlafaxine XR (EFFEXOR-XR) 75 MG 24 hr capsule Take 225 mg by mouth daily with breakfast.    . zolpidem (AMBIEN) 10 MG tablet Take 10 mg by mouth at bedtime as needed for sleep.    . blood glucose meter kit and supplies KIT Dispense based on patient and insurance preference. Use up to four times daily as directed. (FOR ICD-9 250.00, 250.01). 1 each 0  . haloperidol decanoate (HALDOL DECANOATE) 100 MG/ML injection Inject 1 mL (100 mg total) into the muscle every 28 (twenty-eight) days. (Patient not taking: Reported on 09/28/2017) 1 mL 1  . venlafaxine XR (EFFEXOR-XR) 150 MG 24 hr capsule Take 2 capsules (300 mg total) by mouth daily with breakfast. (Patient not taking: Reported on 09/28/2017) 60 capsule 1    Musculoskeletal: Strength & Muscle Tone: within normal limits Gait & Station: normal Patient leans: N/A  Psychiatric Specialty Exam: Physical Exam  Nursing note and vitals reviewed. Constitutional: He appears well-developed and well-nourished.  HENT:  Head: Normocephalic and atraumatic.  Eyes: Conjunctivae are normal. Pupils are equal, round, and reactive to light.  Neck: Normal range of motion.  Cardiovascular: Regular rhythm and normal heart sounds.  Respiratory: Effort normal. No respiratory distress.  GI: Soft.  Musculoskeletal: Normal  range of motion.  Neurological: He is alert.  Skin: Skin is warm and dry.  Psychiatric: His mood appears anxious. His affect is blunt. His speech is delayed. He is slowed. Thought content is not paranoid. Cognition and memory are impaired. He expresses impulsivity. He expresses suicidal ideation. He expresses no homicidal ideation. He expresses no suicidal plans.    Review  of Systems  Constitutional: Negative.   HENT: Negative.   Eyes: Negative.   Respiratory: Negative.   Cardiovascular: Negative.   Gastrointestinal: Negative.   Musculoskeletal: Negative.   Skin: Negative.   Neurological: Negative.   Psychiatric/Behavioral: Positive for depression and suicidal ideas. Negative for hallucinations, memory loss and substance abuse. The patient is nervous/anxious and has insomnia.     Blood pressure 129/73, pulse 82, temperature 98 F (36.7 C), temperature source Oral, resp. rate 16, height 6' 2"  (1.88 m), weight 97.1 kg (214 lb), SpO2 98 %.Body mass index is 27.48 kg/m.  General Appearance: Disheveled  Eye Contact:  Fair  Speech:  Slow  Volume:  Decreased  Mood:  Dysphoric  Affect:  Constricted  Thought Process:  Goal Directed  Orientation:  Full (Time, Place, and Person)  Thought Content:  Illogical  Suicidal Thoughts:  Yes.  without intent/plan  Homicidal Thoughts:  No  Memory:  Immediate;   Fair Recent;   Fair Remote;   Fair  Judgement:  Impaired  Insight:  Lacking  Psychomotor Activity:  Decreased  Concentration:  Concentration: Fair  Recall:  [employer] of Knowledge:  Fair  Language:  Fair  Akathisia:  No  Handed:  Right  AIMS (if indicated):     Assets:  Desire for Improvement  ADL's:  Impaired  Cognition:  Impaired,  Mild  Sleep:        Treatment Plan Summary: Plan Mr. Dilauro seems to be a little better on the more frequent dose of Klonopin.  Also taking the Seroquel and tolerating that well.  He still has poor insight and judgment and is not very cooperative  with coming up with an appropriate plan.  We have been discussing this with multiple team members.  Patient has no financial resources at all.  We may try to contact [employer] for some assistance as well.  Working on possibly trying to refer him to a homeless shelter.  No change in current medicine as of today.  Supportive counseling and review of treatment plan.  I have allowed the involuntary commitment to expire.  I do not think he is really committable and it is clear that he wants to be here receiving treatment at this time.  Disposition: Patient does not meet criteria for psychiatric inpatient admission. Supportive therapy provided about ongoing stressors.  Alethia Berthold, MD 10/03/2017 6:29 PM

## 2017-10-03 NOTE — ED Notes (Signed)
Patient sitting in chair watching tv.

## 2017-10-03 NOTE — ED Notes (Signed)

## 2017-10-03 NOTE — Clinical Social Work Note (Signed)
CSW contacted [place] [name] at [facility] [phone]), who stated the shelter is currently full. Mark [name] stated to call tomorrow, as there may be a bed to come open.   CSW contacted [facility] in [name] [phone]) and spoke with Mark [name], who stated their shelter is full, but they have white falg shelters available for those who come [address] by 6:30pm. CSW checked PART bus schedule and next PART bus to come to [facility] wouldn't be until 5:39pm. [facility] Coord[facility] [name] [phone]) who stated she would reach out to pt's [facility] Peer Support Specialist Mark [name] at [phone]. CSW to arrange transport for Friday. CSW continuing to follow for discharge needs.   Mark [name], Mark MajorsLCSWA, [facility] Clinical Social [name]  Emergency Department [phone]

## 2017-10-03 NOTE — ED Notes (Signed)
meds administered as ordered - he continues to cuss at [employer] - calling her a bitch, etc  Pt is in NAD  Continue to monitor

## 2017-10-03 NOTE — ED Notes (Signed)
Pt is in his room and at the door stating that he wants to leave. Told the ed tech to get his clothes. Pt then asks if his IVC has run out? This RN informed pt that he was voluntary. Pt then states he wants 7 days of his meds filled before he leaves. Informed pt that we do not provide that service. Pt has a bag of medicine in his belongings. Pt has bottles of meds and needles for his insulin. Verified in the computer, pt only missing klonopin. Pt also has pages of prescriptions from this facility that he never got filled. Told pt that he needed to fill those prescriptions. Pt then states he wants to stay. Items removed from room and this RN told pt that if he stayed he would behave himself and that cursing, threatening, flipping off staff, laying in the floor, or being a nuisance in any way would not be tolerated and that any aggression towards the staff would result in the pt leaving the facility and if necessary in handcuffs and taken to jail with felony charges. Pt verbalized understanding.

## 2017-10-03 NOTE — ED Notes (Signed)
Per ed tech, pt has laid in the floor of the bathroom and is cursing at staff and refusing to get up out of the floor.

## 2017-10-03 NOTE — ED Notes (Signed)
BEHAVIORAL HEALTH ROUNDING Patient sleeping: Yes.   Patient alert and oriented: eyes closed  Appears to be asleep Behavior appropriate: Yes.  ; If no, describe:  Nutrition and fluids offered: Yes  Toileting and hygiene offered: sleeping Sitter present: q 15 minute observations and security monitoring Law enforcement present: yes  ODS 

## 2017-10-03 NOTE — Discharge Instructions (Signed)
Return to the ER immediately for any new or worsening symptoms, or any thoughts of wanting to hurt yourself or anyone else.  You can follow-up with your regular psychiatrist or via the provided referral.

## 2017-10-03 NOTE — ED Notes (Signed)
ivc papers needs to be renewed today

## 2017-10-03 NOTE — ED Notes (Signed)
PT  VOL  NOW PER  DR  CLAPACS  PENDING  PLACEMENT

## 2017-10-03 NOTE — ED Notes (Signed)
Diet ginger ale and a pack of ranch dressing given to patient.

## 2017-10-03 NOTE — ED Provider Notes (Signed)
-----------------------------------------   6:00 AM on 10/03/2017 -----------------------------------------   Blood pressure 116/72, pulse 76, temperature 98.1 F (36.7 C), temperature source Oral, resp. rate 18, height 6\' 2"  (1.88 m), weight 97.1 kg (214 lb), SpO2 98 %.  The patient had no acute events since last update.  Calm and cooperative at this time.  Disposition is pending Psychiatry/Behavioral Medicine team recommendations.     Merrily Brittleifenbark, Strider Vallance, MD 10/03/17 0600

## 2017-10-03 NOTE — Progress Notes (Signed)
Spoke with Dr. Toni Amendlapacs about documentation of patient's suicidal ideation, suicidal intent, risk for suicide, suicidal plan. Dr. Toni Amendlapacs stated he did not think the patient was at risk for suicide, and would be ok to leave if he would like. Dr. Toni Amendlapacs said he would change his documentation when he got home.

## 2017-10-03 NOTE — ED Notes (Signed)
ED Is the patient under IVC or is there intent for IVC: Yes.   Is the patient medically cleared: Yes.   Is there vacancy in the ED BHU: Yes.   Is the population mix appropriate for patient: Yes.   Is the patient awaiting placement in inpatient or outpatient setting: Yes.   Has the patient had a psychiatric consult: Yes.   Survey of unit performed for contraband, proper placement and condition of furniture, tampering with fixtures in bathroom, shower, and each patient room: Yes.  ; Findings:  APPEARANCE/BEHAVIOR Calm and cooperative NEURO ASSESSMENT Orientation: oriented x3  Denies pain Hallucinations: No.None noted (Hallucinations) Speech: Normal Gait: normal RESPIRATORY ASSESSMENT Even  Unlabored respirations  CARDIOVASCULAR ASSESSMENT Pulses equal   regular rate  Skin warm and dry   GASTROINTESTINAL ASSESSMENT no GI complaint EXTREMITIES Full ROM  PLAN OF CARE Provide calm/safe environment. Vital signs assessed twice daily. ED BHU Assessment once each 12-hour shift. Collaborate with TTS when available or as condition indicates. Assure the ED provider has rounded once each shift. Provide and encourage hygiene. Provide redirection as needed. Assess for escalating behavior; address immediately and inform ED provider.  Assess family dynamic and appropriateness for visitation as needed: Yes.  ; If necessary, describe findings:  Educate the patient/family about BHU procedures/visitation: Yes.  ; If necessary, describe findings:

## 2017-10-03 NOTE — ED Notes (Signed)
Recliner removed from room. Explained to pt that he did not require a recliner and hospital bed. Pt protested but agreed. Recliner removed from room.

## 2017-10-03 NOTE — ED Notes (Signed)
Pt asked for phone so he can call his friend to come and pick him up.

## 2017-10-03 NOTE — ED Provider Notes (Signed)
-----------------------------------------   8:44 PM on 10/03/2017 -----------------------------------------  Patient now states that he would like to leave.  Patient was previously evaluated this afternoon and cleared by Dr. Toni Amendlapacs, and his IVC was rescinded.  There is no evidence of danger to self or others at this time, and the patient is safe for discharge home.  We will discharge per the patient's wishes.   Dionne BucySiadecki, Hanako Tipping, MD 10/03/17 2044

## 2017-10-03 NOTE — ED Notes (Signed)
Offered patient shower again , patient  sates he can't  because his medication has him sleepy notified nurse Amy T.

## 2017-10-04 NOTE — Consult Note (Signed)
Psychiatry: This is a follow-up note regarding this patient who was released from the emergency room yesterday. I had noted on him yesterday when he was still in the emergency room. Later in the evening I was contacted and informed by emergency room staff that the patient was requesting to leave. Patient's involuntary commitment had been allowed to expire earlier in the day.  As noted previously this patient has been difficult and there has been conversation among the psychiatric physician staff regarding appropriate treatment. Patient did not appear to be a candidate for admission to the inpatient psychiatric ward. His behavior in the emergency room has still been intermittently belligerent although he clearly has enough control over it to aim his behavior only at certain people. He has made statements about having suicidal ideation but at the same time clearly makes statements indicating positive specific plans for the future and has not attempted to kill himself in the emergency room. Patient had understood that we were working on trying to find a shelter that would take him.  I spoke with nursing before the patient left the emergency room last night. I did not feel that he met commitment criteria or that there were grounds for forcing him to continue to stay. Patient is aware of his medical problems and the risks and benefits of treatment. Patient had the capacity to make decisions about leaving the hospital and about his further medical treatment.

## 2017-10-08 ENCOUNTER — Other Ambulatory Visit: Payer: Self-pay

## 2017-10-08 ENCOUNTER — Emergency Department: Payer: Self-pay

## 2017-10-08 ENCOUNTER — Encounter: Payer: Self-pay | Admitting: Emergency Medicine

## 2017-10-08 ENCOUNTER — Emergency Department
Admission: EM | Admit: 2017-10-08 | Discharge: 2017-10-08 | Disposition: A | Discharge status: Home / Self Care | Payer: Self-pay | Attending: Emergency Medicine | Admitting: Emergency Medicine

## 2017-10-08 DIAGNOSIS — F17210 Nicotine dependence, cigarettes, uncomplicated: Secondary | ICD-10-CM | POA: Insufficient documentation

## 2017-10-08 DIAGNOSIS — F329 Major depressive disorder, single episode, unspecified: Secondary | ICD-10-CM | POA: Insufficient documentation

## 2017-10-08 DIAGNOSIS — F333 Major depressive disorder, recurrent, severe with psychotic symptoms: Secondary | ICD-10-CM | POA: Insufficient documentation

## 2017-10-08 DIAGNOSIS — R451 Restlessness and agitation: Secondary | ICD-10-CM | POA: Insufficient documentation

## 2017-10-08 DIAGNOSIS — R739 Hyperglycemia, unspecified: Secondary | ICD-10-CM

## 2017-10-08 DIAGNOSIS — Z794 Long term (current) use of insulin: Secondary | ICD-10-CM | POA: Insufficient documentation

## 2017-10-08 DIAGNOSIS — Z79899 Other long term (current) drug therapy: Secondary | ICD-10-CM | POA: Insufficient documentation

## 2017-10-08 DIAGNOSIS — F609 Personality disorder, unspecified: Secondary | ICD-10-CM | POA: Insufficient documentation

## 2017-10-08 DIAGNOSIS — E1165 Type 2 diabetes mellitus with hyperglycemia: Secondary | ICD-10-CM | POA: Insufficient documentation

## 2017-10-08 DIAGNOSIS — F341 Dysthymic disorder: Secondary | ICD-10-CM

## 2017-10-08 DIAGNOSIS — E119 Type 2 diabetes mellitus without complications: Secondary | ICD-10-CM | POA: Insufficient documentation

## 2017-10-08 LAB — CBC
HCT: 39.2 % — ABNORMAL LOW (ref 40.0–52.0)
Hemoglobin: 13.3 g/dL (ref 13.0–18.0)
MCH: 32.6 pg (ref 26.0–34.0)
MCHC: 34.1 g/dL (ref 32.0–36.0)
MCV: 95.7 fL (ref 80.0–100.0)
PLATELETS: 299 10*3/uL (ref 150–440)
RBC: 4.1 MIL/uL — ABNORMAL LOW (ref 4.40–5.90)
RDW: 12.4 % (ref 11.5–14.5)
WBC: 8.2 10*3/uL (ref 3.8–10.6)

## 2017-10-08 LAB — COMPREHENSIVE METABOLIC PANEL
ALBUMIN: 4.2 g/dL (ref 3.5–5.0)
ALT: 16 U/L — AB (ref 17–63)
AST: 21 U/L (ref 15–41)
Alkaline Phosphatase: 109 U/L (ref 38–126)
Anion gap: 9 (ref 5–15)
BILIRUBIN TOTAL: 0.5 mg/dL (ref 0.3–1.2)
BUN: 7 mg/dL (ref 6–20)
CHLORIDE: 100 mmol/L — AB (ref 101–111)
CO2: 27 mmol/L (ref 22–32)
CREATININE: 0.84 mg/dL (ref 0.61–1.24)
Calcium: 9.1 mg/dL (ref 8.9–10.3)
GFR calc Af Amer: >60 mL/min (ref >60)
GLUCOSE: 353 mg/dL — AB (ref 65–99)
POTASSIUM: 3.9 mmol/L (ref 3.5–5.1)
Sodium: 136 mmol/L (ref 135–145)
TOTAL PROTEIN: 7.5 g/dL (ref 6.5–8.1)

## 2017-10-08 LAB — URINALYSIS, COMPLETE (UACMP) WITH MICROSCOPIC
Bacteria, UA: NONE SEEN
Bilirubin Urine: NEGATIVE
Glucose, UA: >=500 mg/dL — AB
Ketones, ur: NEGATIVE mg/dL
Leukocytes, UA: NEGATIVE
Nitrite: NEGATIVE
PROTEIN: NEGATIVE mg/dL
SPECIFIC GRAVITY, URINE: 1.02 (ref 1.005–1.030)
SQUAMOUS EPITHELIAL / LPF: NONE SEEN
pH: 7 (ref 5.0–8.0)

## 2017-10-08 LAB — GLUCOSE, CAPILLARY
GLUCOSE-CAPILLARY: 252 mg/dL — AB (ref 65–99)
GLUCOSE-CAPILLARY: 176 mg/dL — AB (ref 65–99)

## 2017-10-08 MED ORDER — BLOOD GLUCOSE MONITOR KIT: PACK | 0 refills | Status: DC

## 2017-10-08 MED ORDER — SODIUM CHLORIDE 0.9 % IV BOLUS (SEPSIS)
1000.0000 mL | Freq: Once | INTRAVENOUS | Status: AC
  Administered 2017-10-08: 1000 mL via INTRAVENOUS

## 2017-10-08 MED ORDER — INSULIN ASPART 100 UNIT/ML ~~LOC~~ SOLN: 6.0000 [IU] | Freq: Three times a day (TID) | SUBCUTANEOUS | 11 refills | Status: DC

## 2017-10-08 MED ORDER — INSULIN ASPART 100 UNIT/ML ~~LOC~~ SOLN
8.0000 [IU] | Freq: Once | SUBCUTANEOUS | Status: AC
  Administered 2017-10-08: 8 [IU] via INTRAVENOUS
  Filled 2017-10-08: qty 1

## 2017-10-08 MED ORDER — INSULIN GLARGINE 100 UNIT/ML ~~LOC~~ SOLN: 38.0000 [IU] | SUBCUTANEOUS | 0 refills | Status: DC

## 2017-10-08 NOTE — ED Notes (Signed)
Pt given meal tray and walked out to lobby.

## 2017-10-08 NOTE — ED Provider Notes (Signed)
[facility] Emergency Department Provider Note  Time seen: 8:19 PM  I have reviewed the triage vital signs and the nursing notes.   HISTORY  Chief Complaint Mental Health Problem    HPI Mark [name] is a 31 y.o. male with a past medical history of diabetes, personality disorder, who presents to the emergency department for suicidal ideation.  Patient was seen by myself earlier today for high blood sugar, upon attempted discharge the patient then states suicidal ideation.  Patient has been seen and evaluated by psychiatry just hours previously and believed to be safe to be discharged home.  Patient was seen by Education officer, museum given information for local shelters as well as a taxicab voucher to the shelter.  Here the patient is claiming that nobody helped him last time and that he had no way to get to the shelter, even though the patient received a taxi voucher to the shelter.  Patient was speaking to the nurse, when I went to evaluate the patient he became acutely agitated saying that nobody helps him and he needs to leave this "shitty hospital"and go somewhere where they can help him.  Patient then stood up and began ripping his paper scrubs often states he is leaving.  Will not allow physical examination.  Past Medical History:  Diagnosis Date  . Diabetes mellitus without complication ([place])   . Hydrocephalus   . Kidney stones     Patient Active Problem List   Diagnosis Date Noted  . Dysthymia 10/08/2017  . Personality disorder ([place]) 09/26/2017  . 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 Obstructive hydrocephalus 09/19/2017  . Diabetes ([place]) 09/11/2017  . Hypoglycemia 09/09/2017  . MDD (major depressive disorder) 08/22/2017  . 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 Nausea & vomiting 08/18/2017  . Abdominal pain 08/18/2017  . Hyponatremia 08/18/2017  . DKA (diabetic ketoacidoses) ([place]) 11/24/2015  . Diabetic keto-acidosis ([place])  10/23/2015    Past Surgical History:  Procedure Laterality Date  . KIDNEY STONE SURGERY      Prior to Admission medications   Medication Sig Start Date End Date Taking? Authorizing Provider  blood glucose meter kit and supplies KIT Dispense based on patient and insurance preference. Use up to four times daily as directed. (FOR ICD-9 250.00, 250.01). 10/08/17   Harvest Dark, MD  gabapentin (NEURONTIN) 400 MG capsule Take 3 capsules (1,200 mg total) by mouth 3 (three) times daily. 09/17/17   Pucilowska, Herma Ard B, MD  haloperidol decanoate (HALDOL DECANOATE) 100 MG/ML injection Inject 1 mL (100 mg total) into the muscle every 28 (twenty-eight) days. 09/25/17   Pucilowska, Herma Ard B, MD  hydrOXYzine (ATARAX/VISTARIL) 25 MG tablet Take 1 tablet by mouth daily. 08/21/16   [provider]  insulin aspart (NOVOLOG) 100 UNIT/ML injection Inject 6 Units into the skin 3 (three) times daily with meals. 10/08/17   Harvest Dark, MD  insulin glargine (LANTUS) 100 UNIT/ML injection Inject 0.38 mLs (38 Units total) into the skin every morning. 10/08/17 11/07/17  Harvest Dark, MD  mirtazapine (REMERON) 30 MG tablet Take 30 mg by mouth at bedtime.    [provider]  traZODone (DESYREL) 100 MG tablet Take 1 tablet (100 mg total) by mouth at bedtime as needed for sleep. 09/17/17   Pucilowska, Herma Ard B, MD  venlafaxine XR (EFFEXOR-XR) 150 MG 24 hr capsule Take 2 capsules (300 mg total) by mouth daily with breakfast. 09/26/17   Pucilowska, Jolanta B, MD  zolpidem (AMBIEN) 10 MG tablet  Take 10 mg by mouth at bedtime as needed for sleep.    [provider]    Allergies  Allergen Reactions  . Penicillins Anaphylaxis, Hives and Swelling    Has patient had a PCN reaction causing immediate rash, facial/tongue/throat swelling, SOB or lightheadedness with hypotension: Yes Has patient had a PCN reaction causing severe rash involving mucus membranes or skin necrosis: No Has  patient had a PCN reaction that required hospitalization Yes Has patient had a PCN reaction occurring within the last 10 years: Yes If all of the above answers are "NO", then may proceed with Cephalosporin use.  . Sulfa Antibiotics Anaphylaxis and Hives  . Clindamycin/Lincomycin Hives    Family History  Problem Relation Age of Onset  . Breast cancer Mother     Social History Social History   Tobacco Use  . Smoking status: Current Every Day Smoker    Packs/day: 1.00    Types: Cigarettes  . Smokeless tobacco: Never Used  Substance Use Topics  . Alcohol use: No    Comment: ocasional   . Drug use: Yes    Types: Marijuana    Comment: "as often as I can"    Review of Systems Unable to obtain review of systems questioning due to patient cooperation  ____________________________________________   PHYSICAL EXAM:  VITAL SIGNS: ED Triage Vitals  Enc Vitals Group     BP 10/08/17 2001 134/81     Pulse Rate 10/08/17 2001 71     Resp 10/08/17 2001 18     Temp 10/08/17 2001 98.8 F (37.1 C)     Temp Source 10/08/17 2001 Oral     SpO2 10/08/17 2001 100 %     Weight 10/08/17 2001 214 lb (97.1 kg)     Height 10/08/17 2001 6' 2"  (1.88 m)     Head Circumference --      Peak Flow --      Pain Score 10/08/17 2000 0     Pain Loc --      Pain Edu? --      Excl. in [place]? --    Visual physical examination only. Constitutional: Alert, agitated. Respiratory: No respiratory distress.  Speaking without difficulty in full sentences Musculoskeletal: Ambulates without difficulty moves all extremities well. Neurologic:  Normal speech and language.  Ambulates without difficulty moves all extremities.  No gross deficits Skin: Skin is normal in color. Psychiatric: Patient initially calm, then when questioned became agitated saying that he does not want to stay here and wants to leave  ____________________________________________  [place] [facility] [place] / ED  COURSE  Pertinent labs & imaging results that were available during my care of the patient were reviewed by me and considered in my medical decision making (see chart for details).  Patient presents back to the emergency department stating suicidal thoughts.  Patient states he did not leave the emergency department he has waited in the lobby and then check back in.  Patient said he had no way to get to the shelter, although I personally was in the room and the patient received a taxicab voucher just hours ago to go to the shelter.  Patient stated to the nurse that he has no vertigo and is asking just to spend the night in the emergency department.  Patient became acutely agitated ripped his scrubs off and stated he is leaving this hospital.  The police are involved, security officers are with the patient, [place] police are in route to the  hospital to take the patient to a shelter.  At this time the patient has seen psychiatry just hours ago and was cleared from a psychiatric standpoint.  Patient has been here numerous times for similar complaints in the past.  I had the patient see social work while he was here just hours ago, refused information for the local shelters, refused open-door clinic information.  However during the end of that ER evaluation the patient was agreeable to go to the shelter but apparently did not do so.  Patient became acutely agitated prior to allowing physical exam.  ____________________________________________   FINAL CLINICAL IMPRESSION(S) / ED DIAGNOSES  Agitation    Harvest Dark, MD 10/08/17 2031

## 2017-10-08 NOTE — ED Notes (Signed)
Pt updated on Xray results and has requested a taxi to the homeless shelter. Pt is alert and oriented and has been able to keep down a diet coke. Pt updated on housing options in the area. Pt is alert and oriented x 4 and ambulatory.

## 2017-10-08 NOTE — ED Notes (Signed)
This RN over to speak with pt. Pt was discharged at 1800 with a [place] tray, a cab voucher to the shelter as he requested. Pt then checked back in stating he was suicidal. Pt was just evaluated by Dr Toni Amendlapacs and cleared. Pt has been malingering in the ER over the last few weeks. I asked pt why he was back in the ER. Pt stated that "they told me to check back in". I advised pt that staff offer checking in to everyone. I asked pt what he would like us to do. Pt stated he had nowhere to go. Read the notes to pt from the prvious RN who had d/c him 2 hours prior that he requested a cab voucher to the homeless shelter. Pt then said he had no way to get there. Told pt we had a voucher for him. Pt then states I have nowhere to go. Advised pt he requested to go to shelter. Advised pt that we have given him RX, resources, placement options, food, shelter and medical care and we do not know hwat else he would like us to do for him. Pt is becoming upset and says "I don't want anything." I asked pt if he would like his clothing. Pt stated "Well at least let me stay the night." Told pt that he could stay in the lobby." Pt then said he was done talking to this RN. Advised pt that I was his nurse. Pt then ripped his shirt off. This RN walks away in attempt to deesculate. Pt then gets up and goes to bathrrom yelling and cursing.

## 2017-10-08 NOTE — ED Provider Notes (Signed)
[facility] Emergency Department Provider Note  Time seen: 3:30 PM  I have reviewed the triage vital signs and the nursing notes.   HISTORY  Chief Complaint Hyperglycemia    HPI Mark [name] is a 31 y.o. male with a past medical history of diabetes, on insulin, depression, personality disorder, presents to the emergency department for high blood sugar.  According to the patient he has not taken insulin in her psychiatric medications in 3 days, told EMS 1 and half weeks.  Patient also states he has not been checking his blood glucose at home.  The patient denies any nausea, vomiting, diarrhea.  Denies dysuria, fever, cough or congestion.  Overall the patient appears well, lying in bed, no distress, answering questions appropriately.   Past Medical History:  Diagnosis Date  . Diabetes mellitus without complication ([place])   . Hydrocephalus   . Kidney stones     Patient Active Problem List   Diagnosis Date Noted  . Personality disorder ([place]) 09/26/2017  . 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 Obstructive hydrocephalus 09/19/2017  . Diabetes ([place]) 09/11/2017  . Hypoglycemia 09/09/2017  . MDD (major depressive disorder) 08/22/2017  . 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 Nausea & vomiting 08/18/2017  . Abdominal pain 08/18/2017  . Hyponatremia 08/18/2017  . DKA (diabetic ketoacidoses) ([place]) 11/24/2015  . Diabetic keto-acidosis ([place]) 10/23/2015    Past Surgical History:  Procedure Laterality Date  . KIDNEY STONE SURGERY      Prior to Admission medications   Medication Sig Start Date End Date Taking? Authorizing Provider  blood glucose meter kit and supplies KIT Dispense based on patient and insurance preference. Use up to four times daily as directed. (FOR ICD-9 250.00, 250.01). 09/17/17   Pucilowska, Herma Ard B, MD  gabapentin (NEURONTIN) 400 MG capsule Take 3 capsules (1,200 mg total) by mouth 3 (three) times daily.  09/17/17   Pucilowska, Jolanta B, MD  haloperidol (HALDOL) 5 MG tablet Take 5 mg by mouth 2 (two) times daily.    [provider]  haloperidol decanoate (HALDOL DECANOATE) 100 MG/ML injection Inject 1 mL (100 mg total) into the muscle every 28 (twenty-eight) days. Patient not taking: Reported on 09/28/2017 09/25/17   Pucilowska, Herma Ard B, MD  insulin aspart (NOVOLOG) 100 UNIT/ML injection Inject 6 Units into the skin 3 (three) times daily with meals. 09/17/17   Pucilowska, Jolanta B, MD  insulin glargine (LANTUS) 100 UNIT/ML injection Inject 0.38 mLs (38 Units total) into the skin every morning. 09/25/17 10/25/17  Pucilowska, Herma Ard B, MD  mirtazapine (REMERON) 30 MG tablet Take 30 mg by mouth at bedtime.    [provider]  traZODone (DESYREL) 100 MG tablet Take 1 tablet (100 mg total) by mouth at bedtime as needed for sleep. 09/17/17   Pucilowska, Herma Ard B, MD  venlafaxine XR (EFFEXOR-XR) 150 MG 24 hr capsule Take 2 capsules (300 mg total) by mouth daily with breakfast. Patient not taking: Reported on 09/28/2017 09/26/17   Pucilowska, Herma Ard B, MD  venlafaxine XR (EFFEXOR-XR) 75 MG 24 hr capsule Take 225 mg by mouth daily with breakfast.    [provider]  zolpidem (AMBIEN) 10 MG tablet Take 10 mg by mouth at bedtime as needed for sleep.    [provider]    Allergies  Allergen Reactions  . Penicillins Anaphylaxis, Hives and Swelling    Has patient had a PCN reaction causing immediate rash, facial/tongue/throat swelling, SOB or lightheadedness with hypotension: Yes Has  patient had a PCN reaction causing severe rash involving mucus membranes or skin necrosis: No Has patient had a PCN reaction that required hospitalization Yes Has patient had a PCN reaction occurring within the last 10 years: Yes If all of the above answers are "NO", then may proceed with Cephalosporin use.  . Sulfa Antibiotics Anaphylaxis and Hives  . Clindamycin/Lincomycin Hives     Family History  Problem Relation Age of Onset  . Breast cancer Mother     Social History Social History   Tobacco Use  . Smoking status: Current Every Day Smoker    Packs/day: 1.00    Types: Cigarettes  . Smokeless tobacco: Never Used  Substance Use Topics  . Alcohol use: No    Comment: ocasional   . Drug use: Yes    Types: Marijuana    Comment: "as often as I can"    Review of Systems Constitutional: Negative for fever. ENT: Negative for congestion Cardiovascular: Negative for chest pain. Respiratory: Negative for shortness of breath. Gastrointestinal: Negative for abdominal pain, vomiting and diarrhea. Genitourinary: Negative for dysuria Neurological: Negative for headache All other ROS negative  ____________________________________________   PHYSICAL EXAM:  VITAL SIGNS: ED Triage Vitals  Enc Vitals Group     BP 10/08/17 1503 132/81     Pulse Rate 10/08/17 1503 60     Resp 10/08/17 1503 16     Temp 10/08/17 1503 98.3 F (36.8 C)     Temp Source 10/08/17 1503 Oral     SpO2 10/08/17 1503 99 %     Weight 10/08/17 1506 214 lb (97.1 kg)     Height 10/08/17 1506 6' 2"  (1.88 m)     Head Circumference --      Peak Flow --      Pain Score 10/08/17 1502 6     Pain Loc --      Pain Edu? --      Excl. in [place]? --    Constitutional: Alert and oriented. Well appearing and in no distress. Eyes: Normal exam ENT   Head: Normocephalic and atraumatic.   Mouth/Throat: Dry mucous membranes. Cardiovascular: Normal rate, regular rhythm. No murmur Respiratory: Normal respiratory effort without tachypnea nor retractions. Breath sounds are clear  Gastrointestinal: Soft and nontender. No distention. Musculoskeletal: Nontender with normal range of motion in all extremities. Neurologic:  Normal speech and language. No gross focal neurologic deficits  Skin:  Skin is warm, dry and intact.  Psychiatric: Mood and affect are normal.    INITIAL IMPRESSION / ASSESSMENT  AND PLAN / ED COURSE  Pertinent labs & imaging results that were available during my care of the patient were reviewed by me and considered in my medical decision making (see chart for details).  Patient presents to the emergency department for elevated blood glucose although the patient has not been checking it at home.  States he has been out of his medication for 3 days, including insulin.  Differential would include hyperglycemia, DKA.  We will check labs, VBG, IV hydrate while awaiting lab results.  Overall the patient appears well, no distress we will continue to closely monitor in the emergency department.  Patient's labs have resulted with a normal anion gap, largely normal pH.  Blood glucose is elevated 353 we will dose IV insulin and continue with IV hydration.  However at this time it does not appear that the patient is in DKA.  I discussed this with the patient as well as likelihood of discharge once  his blood sugar has declined.  I offered to refill the patient's medications for him as well as he states he has lost them all.  However upon discussing this with the patient he states "If you discharge me that I am going to kill myself."When I started discussing this further the patient admits that he is homeless and has nowhere to go.  I will have Education officer, museum as well as psychiatry see the patient.  However patient has recently been in the emergency department for psychiatric reasons was discharged from the ER last week and at that time it was not thought that an inpatient admission would be beneficial for the patient given his recurrent suicidal threats and medical noncompliance.  Patient has been seen by psychiatry and they believe the patient is safe for discharge home from a psychiatric standpoint.  I placed a consult in for social work.  Social worker has seen the patient, attempted to offer resources to the patient such as open door clinic.  Patient states he is not interested and does not  want the resources provided.  States he wants to leave the hospital.  Patient's blood glucose on last check is 252.  Patient's blood glucose is now down to 170.  Patient is requesting an x-ray of his right elbow.  States he fell 2 days ago landing on the right elbow.  Have examined the elbow it appears normal no significant swelling or edema, good range of motion but he does complain of pain with range of motion we will obtain an x-ray as a precaution.  Patient stated he wishes to be discharged after the x-ray.  He has already had the nurse remove his IV.  X-ray is negative. ____________________________________________   FINAL CLINICAL IMPRESSION(S) / ED DIAGNOSES  Hyperglycemia    Harvest Dark, MD 10/08/17 1740

## 2017-10-08 NOTE — ED Notes (Signed)
Pt has taken himself off the monitor and has taken out IV. Pt reported to staff that he was going to leave. Pt walking around room when RN entered. RN asked pt to stay and allow blood glucose to be checked again. Pt agreed and reports he does not want to leave but the social worker made him mad. Pt then continues to state that his right elbow is hurting very badly and he wants to have it xrayed. MD made aware. No deformity noted no color change and no decrease in mobility at this time.

## 2017-10-08 NOTE — Consult Note (Signed)
[facility] Face-to-Face Psychiatry Consult   Reason for Consult: Consult for 31 year old man with a history of mood disorder and behavior problems.  Made suicidal statements in the emergency room. Referring Physician: Paduchowski Patient Identification: Mark [name] MRN:  [id number] Principal Diagnosis: Personality disorder [facility]) Diagnosis:   Patient Active Problem List   Diagnosis Date Noted  . Dysthymia [F34.1] 10/08/2017  . Personality disorder ([place]) [F60.9] 09/26/2017  . 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 Obstructive hydrocephalus [G91.1] 09/19/2017  . Diabetes ([place]) [E11.9] 09/11/2017  . Hypoglycemia [E16.2] 09/09/2017  . MDD (major depressive disorder) [F32.9] 08/22/2017  . 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 Nausea & vomiting [R11.2] 08/18/2017  . Abdominal pain [R10.9] 08/18/2017  . Hyponatremia [E87.1] 08/18/2017  . DKA (diabetic ketoacidoses) ([place]) [E13.10] 11/24/2015  . Diabetic keto-acidosis ([place]) [E13.10] 10/23/2015    Total Time spent with patient: 30 minutes  Subjective:   Mark [name] is a 31 y.o. male patient admitted with "I just do not know what else to do".  HPI: Patient interviewed chart reviewed.  Patient well-known to me from prior encounters.  Case reviewed with emergency room physician and social work.  31 year old man who was brought to the emergency room from a local store where he was passing out from complications of his diabetes.  Blood sugar is extremely high.  Patient had not been compliant with any of his medical regimen.  Patient was being worked up and treated for his diabetes when he told the intake physician that he had suicidal ideation.  I met with the patient.  He was last seen in the emergency room several days ago when he left [place].  Today it turns out that he has been able to find places to stay in the last several days.  Patient appears to have been taking care of at least  basic hygiene.  He is awake alert oriented and interactive.  Patient irritably tells me that this time he is really definitely going to kill himself because there is nothing else that he can do with his life.  I tried to go over with him some of the other options that he had and suggest that we have a conversation or that he talk with social work about some of the social options.  Patient remains defiant about his hopelessness.  No evidence of substance abuse.  Medical history: Diabetes dependent on insulin.  History of hydrocephalus with consequent cognitive problems ambulation problems and enuresis.  Substance abuse history: Generally does not appear to have an active substance abuse issue  Social history: Patient is homeless and has no financial support.  His mother died this year and she had been his primary support for years.  Patient has had difficulty holding down a job because of his medical problems.  Past Psychiatric History: Patient has been seen by psychiatry several times before.  He has been an inpatient and been seen in the emergency room in total service.  Patient was the subject of a discussion by the psychiatric physicians team ad hoc ethics committee to discuss difficult dispositions.  Patient shows no signs of benefiting from inpatient psychiatric treatment and tends to be disruptive to the milieu.  He has a constant pattern of rejecting help which does not seem to be coming out of depression but out of irritability and anger.  Patient is not psychotic.  He does have a history of doing things that could potentially harm him in the  past.  Had not recently actually tried to kill himself.  Has not been compliant with and not responded to psychiatric medicine.  Risk to Self: Is patient at risk for suicide?: Yes("I'm feeling a little bit like that") Risk to Others:   Prior Inpatient Therapy:   Prior Outpatient Therapy:    Past Medical History:  Past Medical History:  Diagnosis Date   . Diabetes mellitus without complication ([place])   . Hydrocephalus   . Kidney stones     Past Surgical History:  Procedure Laterality Date  . KIDNEY STONE SURGERY     Family History:  Family History  Problem Relation Age of Onset  . Breast cancer Mother    Family Psychiatric  History: Unknown Social History:  Social History   Substance and Sexual Activity  Alcohol Use No   Comment: ocasional      Social History   Substance and Sexual Activity  Drug Use Yes  . Types: Marijuana   Comment: "as often as I can"    Social History   Socioeconomic History  . Marital status: Single    Spouse name: None  . Number of children: None  . Years of education: None  . Highest education level: None  Social Needs  . Financial resource strain: None  . Food insecurity - worry: None  . Food insecurity - inability: None  . Transportation needs - medical: None  . Transportation needs - non-medical: None  Occupational History  . None  Tobacco Use  . Smoking status: Current Every Day Smoker    Packs/day: 1.00    Types: Cigarettes  . Smokeless tobacco: Never Used  Substance and Sexual Activity  . Alcohol use: No    Comment: ocasional   . Drug use: Yes    Types: Marijuana    Comment: "as often as I can"  . Sexual activity: None  Other Topics Concern  . None  Social History Narrative  . None   Additional Social History:    Allergies:   Allergies  Allergen Reactions  . Penicillins Anaphylaxis, Hives and Swelling    Has patient had a PCN reaction causing immediate rash, facial/tongue/throat swelling, SOB or lightheadedness with hypotension: Yes Has patient had a PCN reaction causing severe rash involving mucus membranes or skin necrosis: No Has patient had a PCN reaction that required hospitalization Yes Has patient had a PCN reaction occurring within the last 10 years: Yes If all of the above answers are "NO", then may proceed with Cephalosporin use.  . Sulfa Antibiotics  Anaphylaxis and Hives  . Clindamycin/Lincomycin Hives    Labs:  Results for orders placed or performed during the hospital encounter of 10/08/17 (from the past 48 hour(s))  CBC     Status: Abnormal   Collection Time: 10/08/17  3:04 PM  Result Value Ref Range   WBC 8.2 3.8 - 10.6 K/uL   RBC 4.10 (L) 4.40 - 5.90 MIL/uL   Hemoglobin 13.3 13.0 - 18.0 g/dL   HCT 39.2 (L) 40.0 - 52.0 %   MCV 95.7 80.0 - 100.0 fL   MCH 32.6 26.0 - 34.0 pg   MCHC 34.1 32.0 - 36.0 g/dL   RDW 12.4 11.5 - 14.5 %   Platelets 299 150 - 440 K/uL  Comprehensive metabolic panel     Status: Abnormal   Collection Time: 10/08/17  3:04 PM  Result Value Ref Range   Sodium 136 135 - 145 mmol/L   Potassium 3.9 3.5 - 5.1 mmol/L  Chloride 100 (L) 101 - 111 mmol/L   CO2 27 22 - 32 mmol/L   Glucose, Bld 353 (H) 65 - 99 mg/dL   BUN 7 6 - 20 mg/dL   Creatinine, Ser 0.84 0.61 - 1.24 mg/dL   Calcium 9.1 8.9 - 10.3 mg/dL   Total Protein 7.5 6.5 - 8.1 g/dL   Albumin 4.2 3.5 - 5.0 g/dL   AST 21 15 - 41 U/L   ALT 16 (L) 17 - 63 U/L   Alkaline Phosphatase 109 38 - 126 U/L   Total Bilirubin 0.5 0.3 - 1.2 mg/dL   GFR calc non Af Amer >60 >60 mL/min   GFR calc Af Amer >60 >60 mL/min    Comment: (NOTE) The eGFR has been calculated using the CKD EPI equation. This calculation has not been validated in all clinical situations. eGFR's persistently <60 mL/min signify possible Chronic Kidney Disease.    Anion gap 9 5 - 15  Urinalysis, Complete w Microscopic     Status: Abnormal   Collection Time: 10/08/17  3:04 PM  Result Value Ref Range   Color, Urine STRAW (A) YELLOW   APPearance CLEAR (A) CLEAR   Specific Gravity, Urine 1.020 1.005 - 1.030   pH 7.0 5.0 - 8.0   Glucose, UA >=500 (A) NEGATIVE mg/dL   Hgb urine dipstick SMALL (A) NEGATIVE   Bilirubin Urine NEGATIVE NEGATIVE   Ketones, ur NEGATIVE NEGATIVE mg/dL   Protein, ur NEGATIVE NEGATIVE mg/dL   Nitrite NEGATIVE NEGATIVE   Leukocytes, UA NEGATIVE NEGATIVE   RBC /  HPF 6-30 0 - 5 RBC/hpf   WBC, UA 0-5 0 - 5 WBC/hpf   Bacteria, UA NONE SEEN NONE SEEN   Squamous Epithelial / LPF NONE SEEN NONE SEEN  Blood gas, venous     Status: Abnormal (Preliminary result)   Collection Time: 10/08/17  3:22 PM  Result Value Ref Range   FIO2 PENDING    Delivery systems PENDING    pH, Ven 7.34 7.250 - 7.430   pCO2, Ven 55 44.0 - 60.0 mmHg   pO2, Ven PENDING 32.0 - 45.0 mmHg   Bicarbonate 29.7 (H) 20.0 - 28.0 mmol/L   Acid-Base Excess 2.6 (H) 0.0 - 2.0 mmol/L   O2 Saturation PENDING %   Patient temperature 37.0    Collection site VEIN    Sample type VEIN   Glucose, capillary     Status: Abnormal   Collection Time: 10/08/17  4:21 PM  Result Value Ref Range   Glucose-Capillary 252 (H) 65 - 99 mg/dL    No current facility-administered medications for this encounter.    Current Outpatient Medications  Medication Sig Dispense Refill  . gabapentin (NEURONTIN) 400 MG capsule Take 3 capsules (1,200 mg total) by mouth 3 (three) times daily. 270 capsule 1  . haloperidol decanoate (HALDOL DECANOATE) 100 MG/ML injection Inject 1 mL (100 mg total) into the muscle every 28 (twenty-eight) days. 1 mL 1  . hydrOXYzine (ATARAX/VISTARIL) 25 MG tablet Take 1 tablet by mouth daily.    . insulin aspart (NOVOLOG) 100 UNIT/ML injection Inject 6 Units into the skin 3 (three) times daily with meals. 10 mL 11  . insulin glargine (LANTUS) 100 UNIT/ML injection Inject 0.38 mLs (38 Units total) into the skin every morning. 11.4 mL 0  . mirtazapine (REMERON) 30 MG tablet Take 30 mg by mouth at bedtime.    . traZODone (DESYREL) 100 MG tablet Take 1 tablet (100 mg total) by mouth at bedtime as  needed for sleep. 30 tablet 2  . venlafaxine XR (EFFEXOR-XR) 150 MG 24 hr capsule Take 2 capsules (300 mg total) by mouth daily with breakfast. 60 capsule 1  . zolpidem (AMBIEN) 10 MG tablet Take 10 mg by mouth at bedtime as needed for sleep.    . blood glucose meter kit and supplies KIT Dispense based  on patient and insurance preference. Use up to four times daily as directed. (FOR ICD-9 250.00, 250.01). 1 each 0    Musculoskeletal: Strength & Muscle Tone: within normal limits Gait & Station: unsteady Patient leans: N/A  Psychiatric Specialty Exam: Physical Exam  Nursing note and vitals reviewed. Constitutional: He appears well-developed and well-nourished.  HENT:  Head: Normocephalic and atraumatic.  Eyes: Conjunctivae are normal. Pupils are equal, round, and reactive to light.  Neck: Normal range of motion.  Cardiovascular: Regular rhythm and normal heart sounds.  Respiratory: Effort normal. No respiratory distress.  GI: Soft.  Musculoskeletal: Normal range of motion.  Neurological: He is alert.  Skin: Skin is warm and dry.  Psychiatric: His speech is normal. His affect is blunt. He is not agitated and not aggressive. Thought content is not paranoid. Cognition and memory are impaired. He expresses impulsivity. He expresses suicidal ideation. He expresses no homicidal ideation.    Review of Systems  Constitutional: Negative.   HENT: Negative.   Eyes: Negative.   Respiratory: Negative.   Cardiovascular: Negative.   Gastrointestinal: Negative.   Musculoskeletal: Negative.   Skin: Negative.   Neurological: Negative.   Psychiatric/Behavioral: Positive for depression and suicidal ideas. Negative for hallucinations, memory loss and substance abuse. The patient is nervous/anxious and has insomnia.     Blood pressure (!) 135/97, pulse 60, temperature 98.3 F (36.8 C), temperature source Oral, resp. rate 16, height _0  (1.88 m), weight 97.1 kg (214 lb), SpO2 99 %.Body mass index is 27.48 kg/m.  General Appearance: Casual  Eye Contact:  Good  Speech:  Clear and Coherent  Volume:  Normal  Mood:  Anxious and Dysphoric  Affect:  Congruent  Thought Process:  Goal Directed  Orientation:  Full (Time, Place, and Person)  Thought Content:  Rumination and Tangential  Suicidal  Thoughts:  Yes.  without intent/plan  Homicidal Thoughts:  No  Memory:  Immediate;   Fair Recent;   Fair Remote;   Fair  Judgement:  Impaired  Insight:  Shallow  Psychomotor Activity:  Decreased  Concentration:  Concentration: Poor  Recall:  [employer] of Knowledge:  Fair  Language:  Fair  Akathisia:  No  Handed:  Right  AIMS (if indicated):     Assets:  Resilience  ADL's:  Intact  Cognition:  Impaired,  Mild  Sleep:        Treatment Plan Summary: Plan This is a 31 year old man with a history of mood disorder behavior disorder poor coping skills.  He only announced that he was having suicidal ideation after he came in for medical problems and was stabilized.  He had been making threats about suicide several days ago when he last left the emergency room but has not tried to kill himself in the interim.  Patient has a history of attention seeking behavior and rejecting and attempts to help him and find solutions.  Social work is already aware of the patient and has made an effort to engage him in conversation about ways that he can help himself which he has again rejected.  Patient is not likely to benefit from inpatient hospitalization.  He is at elevated chronic risk of suicidality and has been educated about this and strongly encouraged to follow-up with local mental health care multiple times.  Does not require however at this point involuntary commitment or admission to our unit.  Disposition: Patient does not meet criteria for psychiatric inpatient admission. Supportive therapy provided about ongoing stressors. Discussed crisis plan, support from social network, calling 911, coming to the Emergency Department, and calling Suicide Hotline.  Alethia Berthold, MD 10/08/2017 4:48 PM
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
## 2017-10-08 NOTE — ED Notes (Signed)
Returned to pt his discharge instructions and medication prescriptions that were found lying on ground in ED lobby

## 2017-10-08 NOTE — Clinical Social Work Note (Addendum)
CSW met with pt at bedside to address social work consult. Pt has been to the hospital 8 times within the last 6 months. Pt is his own guardian. Pt is homeless and has been staying with a friend. CSW introduced self. CSW attempted to discuss pt's medication, but pt refused. Pt does not have Medicaid or income. CSW asked pt if he has followed up with [facility] or [facility] and pt stated no. CSW offered resources for pt to connect with [facility] and [facility] and pt stated he "did not need anything" from Education officer, museum. Pt became agitated. Pt threatened to rip his IV out of his arm because pt states "I'm ready to get out of here."  CSW informed pt CSW will let EDP know. CSW updated EDP. Psych MD aware. Pt is aware of resources for shelter. CSW signing off as no further Social Work needs identified.    Oretha Ellis, Latanya Presser, [place] Social Worker Emergency Department [phone]

## 2017-10-08 NOTE — ED Notes (Signed)
Pt is in the bathroom banging on walls and yelling and cursing. [employer] called and officers are enroute.

## 2017-10-08 NOTE — ED Notes (Signed)
Security escorts pt through EMS bay doors and is to stay with him until [employer] arrives. House supervisor aware.
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
## 2017-10-09 LAB — BLOOD GAS, VENOUS
Acid-Base Excess: 2.6 mmol/L — ABNORMAL HIGH (ref 0.0–2.0)
BICARBONATE: 29.7 mmol/L — AB (ref 20.0–28.0)
PCO2 VEN: 55 mmHg (ref 44.0–60.0)
PH VEN: 7.34 (ref 7.250–7.430)
Patient temperature: 37

## 2017-10-17 ENCOUNTER — Emergency Department
Admission: EM | Admit: 2017-10-17 | Discharge: 2017-10-18 | Disposition: A | Discharge status: Home / Self Care | Payer: No Typology Code available for payment source | Attending: Emergency Medicine | Admitting: Emergency Medicine

## 2017-10-17 ENCOUNTER — Encounter: Payer: Self-pay | Admitting: Intensive Care

## 2017-10-17 DIAGNOSIS — E119 Type 2 diabetes mellitus without complications: Secondary | ICD-10-CM | POA: Insufficient documentation

## 2017-10-17 DIAGNOSIS — Z91199 Patient's noncompliance with other medical treatment and regimen due to unspecified reason: Secondary | ICD-10-CM

## 2017-10-17 DIAGNOSIS — F17210 Nicotine dependence, cigarettes, uncomplicated: Secondary | ICD-10-CM | POA: Insufficient documentation

## 2017-10-17 DIAGNOSIS — F329 Major depressive disorder, single episode, unspecified: Secondary | ICD-10-CM | POA: Insufficient documentation

## 2017-10-17 DIAGNOSIS — E139 Other specified diabetes mellitus without complications: Secondary | ICD-10-CM

## 2017-10-17 DIAGNOSIS — Z9119 Patient's noncompliance with other medical treatment and regimen: Secondary | ICD-10-CM

## 2017-10-17 DIAGNOSIS — F609 Personality disorder, unspecified: Secondary | ICD-10-CM

## 2017-10-17 DIAGNOSIS — R45851 Suicidal ideations: Secondary | ICD-10-CM | POA: Insufficient documentation

## 2017-10-17 DIAGNOSIS — F333 Major depressive disorder, recurrent, severe with psychotic symptoms: Secondary | ICD-10-CM | POA: Diagnosis present

## 2017-10-17 LAB — COMPREHENSIVE METABOLIC PANEL
ALK PHOS: 129 U/L — AB (ref 38–126)
ALT: 13 U/L — AB (ref 17–63)
AST: 23 U/L (ref 15–41)
Albumin: 4.9 g/dL (ref 3.5–5.0)
Anion gap: 8 (ref 5–15)
BILIRUBIN TOTAL: 0.5 mg/dL (ref 0.3–1.2)
BUN: 14 mg/dL (ref 6–20)
CALCIUM: 9.8 mg/dL (ref 8.9–10.3)
CO2: 31 mmol/L (ref 22–32)
CREATININE: 0.9 mg/dL (ref 0.61–1.24)
Chloride: 98 mmol/L — ABNORMAL LOW (ref 101–111)
Glucose, Bld: 154 mg/dL — ABNORMAL HIGH (ref 65–99)
Potassium: 3.3 mmol/L — ABNORMAL LOW (ref 3.5–5.1)
Sodium: 137 mmol/L (ref 135–145)
Total Protein: 8.5 g/dL — ABNORMAL HIGH (ref 6.5–8.1)

## 2017-10-17 LAB — SALICYLATE LEVEL

## 2017-10-17 LAB — CBC
HCT: 42.2 % (ref 40.0–52.0)
Hemoglobin: 14.6 g/dL (ref 13.0–18.0)
MCH: 33 pg (ref 26.0–34.0)
MCHC: 34.6 g/dL (ref 32.0–36.0)
MCV: 95.6 fL (ref 80.0–100.0)
PLATELETS: 289 10*3/uL (ref 150–440)
RBC: 4.42 MIL/uL (ref 4.40–5.90)
RDW: 12 % (ref 11.5–14.5)
WBC: 10.9 10*3/uL — ABNORMAL HIGH (ref 3.8–10.6)

## 2017-10-17 LAB — ETHANOL

## 2017-10-17 LAB — URINE DRUG SCREEN, QUALITATIVE (ARMC ONLY)
Amphetamines, Ur Screen: NOT DETECTED
BARBITURATES, UR SCREEN: NOT DETECTED
Benzodiazepine, Ur Scrn: NOT DETECTED
CANNABINOID 50 NG, UR ~~LOC~~: POSITIVE — AB
COCAINE METABOLITE, UR ~~LOC~~: NOT DETECTED
MDMA (Ecstasy)Ur Screen: NOT DETECTED
Methadone Scn, Ur: NOT DETECTED
Opiate, Ur Screen: NOT DETECTED
PHENCYCLIDINE (PCP) UR S: NOT DETECTED
TRICYCLIC, UR SCREEN: NOT DETECTED

## 2017-10-17 LAB — GLUCOSE, CAPILLARY
GLUCOSE-CAPILLARY: 150 mg/dL — AB (ref 65–99)
GLUCOSE-CAPILLARY: 495 mg/dL — AB (ref 65–99)
GLUCOSE-CAPILLARY: 422 mg/dL — AB (ref 65–99)

## 2017-10-17 LAB — ACETAMINOPHEN LEVEL: Acetaminophen (Tylenol), Serum: <10 ug/mL — ABNORMAL LOW (ref 10–30)

## 2017-10-17 MED ORDER — TRAZODONE HCL 100 MG PO TABS
100.0000 mg | ORAL_TABLET | Freq: Every day | ORAL | Status: DC
  Administered 2017-10-17: 100 mg via ORAL
  Filled 2017-10-17: qty 1

## 2017-10-17 MED ORDER — VENLAFAXINE HCL ER 75 MG PO CP24
150.0000 mg | ORAL_CAPSULE | Freq: Every day | ORAL | Status: DC
  Administered 2017-10-18: 150 mg via ORAL
  Filled 2017-10-17: qty 2

## 2017-10-17 MED ORDER — MIRTAZAPINE 15 MG PO TABS
45.0000 mg | ORAL_TABLET | Freq: Every day | ORAL | Status: DC
  Administered 2017-10-17: 45 mg via ORAL
  Filled 2017-10-17: qty 3

## 2017-10-17 MED ORDER — INSULIN ASPART 100 UNIT/ML ~~LOC~~ SOLN
6.0000 [IU] | Freq: Three times a day (TID) | SUBCUTANEOUS | Status: DC
  Administered 2017-10-18 (×2): 6 [IU] via SUBCUTANEOUS
  Filled 2017-10-17 (×2): qty 1

## 2017-10-17 MED ORDER — INSULIN ASPART 100 UNIT/ML ~~LOC~~ SOLN
8.0000 [IU] | Freq: Once | SUBCUTANEOUS | Status: AC
  Administered 2017-10-17: 8 [IU] via SUBCUTANEOUS
  Filled 2017-10-17: qty 1

## 2017-10-17 MED ORDER — INSULIN GLARGINE 100 UNIT/ML ~~LOC~~ SOLN
38.0000 [IU] | Freq: Every day | SUBCUTANEOUS | Status: DC
  Filled 2017-10-17: qty 0.38

## 2017-10-17 MED ORDER — GABAPENTIN 600 MG PO TABS
ORAL_TABLET | ORAL | Status: AC
  Filled 2017-10-17: qty 2

## 2017-10-17 MED ORDER — GABAPENTIN 300 MG PO CAPS
1200.0000 mg | ORAL_CAPSULE | Freq: Three times a day (TID) | ORAL | Status: DC
  Administered 2017-10-17 – 2017-10-18 (×2): 1200 mg via ORAL
  Filled 2017-10-17: qty 4

## 2017-10-17 NOTE — ED Triage Notes (Signed)
Patient brought in by PD. [place] police officers received call from homeless shelter in reference to a man with a  Pair of scissors. Upon arrival officers found patient with a  Pair of scissors stating he wanted to kill himself. [facility]. Patient reports he still wants to kill himself at this time. Patient feels helpless due to recently becoming homeless due to his friend kicking him out. Patient reports being a smoker and partaking in marijuana. Patient denies wanting to hurt others at this time but sometimes wants to.

## 2017-10-17 NOTE — ED Notes (Signed)
Psychiatrist is at his bedside consulting with him - NAD observed

## 2017-10-17 NOTE — ED Notes (Signed)
PT  IVC  SEEN  BY  DR  CLAPACS  PENDING  SOCIAL  WORK  CONSULT  IN  THE  AM

## 2017-10-17 NOTE — Consult Note (Signed)
[facility] Consult   Reason for Consult: Consult for 31 year old man with a history of recurrent behavior problems depressive symptoms and medical problems and major social difficulties comes to the emergency room after claiming to be suicidal at the homeless shelter Referring Physician: Paduchowski Patient Identification: Mark [name] MRN:  [id number] Principal Diagnosis: Personality disorder [facility]) Diagnosis:   Patient Active Problem List   Diagnosis Date Noted  . Noncompliance [Z91.19] 10/17/2017  . Dysthymia [F34.1] 10/08/2017  . Personality disorder ([place]) [F60.9] 09/26/2017  . 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 Obstructive hydrocephalus [G91.1] 09/19/2017  . Diabetes ([place]) [E11.9] 09/11/2017  . Hypoglycemia [E16.2] 09/09/2017  . MDD (major depressive disorder) [F32.9] 08/22/2017  . 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 Nausea & vomiting [R11.2] 08/18/2017  . Abdominal pain [R10.9] 08/18/2017  . Hyponatremia [E87.1] 08/18/2017  . DKA (diabetic ketoacidoses) ([place]) [E13.10] 11/24/2015  . Diabetic keto-acidosis ([place]) [E13.10] 10/23/2015    Total Time spent with patient: 1 hour  Subjective:   Mark [name] is a 31 y.o. male patient admitted with "I am fed up".  HPI: Patient interviewed chart reviewed.  Patient well known from previous encounters.  31 year old man was brought from the shelter today.  He says he had been there for less than a day.  He claims that he was watching television when he saw a television commercial that made him sad.  When he saw this he went and got a pair of scissors and poked himself in the abdomen although when he shows me his abdomen there is no sign of any cut.  Apparently he then and went and told the staff at the shelter and they brought him over here and at the same time told him he was banned from the shelter.  It has been a couple weeks since we saw Mark [name].  He claims that he  has been living outdoors and that time although that seems a little hard to believe.  He will not go into any more detail.  As usual he says that he is severely depressed and really wants to die and thinks he will definitely kill himself.  He says he has been taking care of his diabetes and taking his insulin but has taken none of his other medicine and has not gone for any mental health treatment.  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  Social history: Unfortunate situation that he does not receive any disability or have any benefits.  No longer able to work apparently.  His mother passed away this year and has left him without any family support.  Medical history: History of hydrocephalus also chronic diabetes with multiple episodes of noncompliance and presentation with diabetic ketoacidosis.  Substance abuse history: Recurrent use of marijuana denies other drug use.  Past Psychiatric History: Patient has had multiple encounters psychiatrically and has had previous admissions.  On previous admissions to the psychiatric ward he has been disruptive argumentative non-cooperative and very resistant to any kind of treatment.  Does not follow up with recommended outpatient treatment.  He does have a history of suicidal threats and self injuring behavior in the past.  No evidence of any history of psychosis.  Probably has some cognitive problems chronically.  Risk to Self: Is patient at risk for suicide?: Yes Risk to Others:   Prior Inpatient Therapy:   Prior Outpatient Therapy:    Past Medical History:  Past Medical History:  Diagnosis Date  . Diabetes mellitus without complication (  [place])   . Hydrocephalus   . Kidney stones     Past Surgical History:  Procedure Laterality Date  . KIDNEY STONE SURGERY     Family History:  Family History  Problem Relation Age of Onset  . Breast cancer Mother    Family Psychiatric  History: Unknown Social History:  Social History   Substance and Sexual Activity   Alcohol Use No   Comment: ocasional      Social History   Substance and Sexual Activity  Drug Use Yes  . Types: Marijuana   Comment: "as often as I can"    Social History   Socioeconomic History  . Marital status: Single    Spouse name: None  . Number of children: None  . Years of education: None  . Highest education level: None  Social Needs  . Financial resource strain: None  . Food insecurity - worry: None  . Food insecurity - inability: None  . Transportation needs - medical: None  . Transportation needs - non-medical: None  Occupational History  . None  Tobacco Use  . Smoking status: Current Every Day Smoker    Packs/day: 1.00    Types: Cigarettes  . Smokeless tobacco: Never Used  Substance and Sexual Activity  . Alcohol use: No    Comment: ocasional   . Drug use: Yes    Types: Marijuana    Comment: "as often as I can"  . Sexual activity: None  Other Topics Concern  . None  Social History Narrative  . None   Additional Social History:    Allergies:   Allergies  Allergen Reactions  . Penicillins Anaphylaxis, Hives and Swelling    Has patient had a PCN reaction causing immediate rash, facial/tongue/throat swelling, SOB or lightheadedness with hypotension: Yes Has patient had a PCN reaction causing severe rash involving mucus membranes or skin necrosis: No Has patient had a PCN reaction that required hospitalization Yes Has patient had a PCN reaction occurring within the last 10 years: Yes If all of the above answers are "NO", then may proceed with Cephalosporin use.  . Sulfa Antibiotics Anaphylaxis and Hives  . Clindamycin/Lincomycin Hives    Labs:  Results for orders placed or performed during the hospital encounter of 10/17/17 (from the past 48 hour(s))  Comprehensive metabolic panel     Status: Abnormal   Collection Time: 10/17/17  2:14 PM  Result Value Ref Range   Sodium 137 135 - 145 mmol/L   Potassium 3.3 (L) 3.5 - 5.1 mmol/L   Chloride 98  (L) 101 - 111 mmol/L   CO2 31 22 - 32 mmol/L   Glucose, Bld 154 (H) 65 - 99 mg/dL   BUN 14 6 - 20 mg/dL   Creatinine, Ser 0.90 0.61 - 1.24 mg/dL   Calcium 9.8 8.9 - 10.3 mg/dL   Total Protein 8.5 (H) 6.5 - 8.1 g/dL   Albumin 4.9 3.5 - 5.0 g/dL   AST 23 15 - 41 U/L   ALT 13 (L) 17 - 63 U/L   Alkaline Phosphatase 129 (H) 38 - 126 U/L   Total Bilirubin 0.5 0.3 - 1.2 mg/dL   GFR calc non Af Amer >60 >60 mL/min   GFR calc Af Amer >60 >60 mL/min    Comment: (NOTE) The eGFR has been calculated using the CKD EPI equation. This calculation has not been validated in all clinical situations. eGFR's persistently <60 mL/min signify possible Chronic Kidney Disease.    Anion gap 8 5 -  15    Comment: Performed at [facility], [address] [place], [place] [address]  Ethanol     Status: None   Collection Time: 10/17/17  2:14 PM  Result Value Ref Range   Alcohol, Ethyl (B) <10 <10 mg/dL    Comment:        LOWEST DETECTABLE LIMIT FOR SERUM ALCOHOL IS 10 mg/dL FOR MEDICAL PURPOSES ONLY Performed at [facility], [address] [place], [place] [address]   Salicylate level     Status: None   Collection Time: 10/17/17  2:14 PM  Result Value Ref Range   Salicylate Lvl <8.8 2.8 - 30.0 mg/dL    Comment: Performed at [facility], [address] [place], [place] [address]  Acetaminophen level     Status: Abnormal   Collection Time: 10/17/17  2:14 PM  Result Value Ref Range   Acetaminophen (Tylenol), Serum <10 (L) 10 - 30 ug/mL    Comment:        THERAPEUTIC CONCENTRATIONS VARY SIGNIFICANTLY. A RANGE OF 10-30 ug/mL MAY BE AN EFFECTIVE CONCENTRATION FOR MANY PATIENTS. HOWEVER, SOME ARE BEST TREATED AT CONCENTRATIONS OUTSIDE THIS RANGE. ACETAMINOPHEN CONCENTRATIONS >150 ug/mL AT 4 HOURS AFTER INGESTION AND >50 ug/mL AT 12 HOURS AFTER INGESTION ARE OFTEN ASSOCIATED WITH TOXIC REACTIONS. Performed at [facility], [address] [place],  [place] [address]   cbc     Status: Abnormal   Collection Time: 10/17/17  2:14 PM  Result Value Ref Range   WBC 10.9 (H) 3.8 - 10.6 K/uL   RBC 4.42 4.40 - 5.90 MIL/uL   Hemoglobin 14.6 13.0 - 18.0 g/dL   HCT 42.2 40.0 - 52.0 %   MCV 95.6 80.0 - 100.0 fL   MCH 33.0 26.0 - 34.0 pg   MCHC 34.6 32.0 - 36.0 g/dL   RDW 12.0 11.5 - 14.5 %   Platelets 289 150 - 440 K/uL    Comment: Performed at [facility], [address], [place],  [address]  Urine Drug Screen, Qualitative     Status: Abnormal   Collection Time: 10/17/17  2:14 PM  Result Value Ref Range   Tricyclic, Ur Screen NONE DETECTED NONE DETECTED   Amphetamines, Ur Screen NONE DETECTED NONE DETECTED   MDMA (Ecstasy)Ur Screen NONE DETECTED NONE DETECTED   Cocaine Metabolite,Ur [place] NONE DETECTED NONE DETECTED   Opiate, Ur Screen NONE DETECTED NONE DETECTED   Phencyclidine (PCP) Ur S NONE DETECTED NONE DETECTED   Cannabinoid 50 Ng, Ur [place] POSITIVE (A) NONE DETECTED   Barbiturates, Ur Screen NONE DETECTED NONE DETECTED   Benzodiazepine, Ur Scrn NONE DETECTED NONE DETECTED   Methadone Scn, Ur NONE DETECTED NONE DETECTED    Comment: (NOTE) Tricyclics + metabolites, urine    Cutoff 1000 ng/mL Amphetamines + metabolites, urine  Cutoff 1000 ng/mL MDMA (Ecstasy), urine              Cutoff 500 ng/mL Cocaine Metabolite, urine          Cutoff 300 ng/mL Opiate + metabolites, urine        Cutoff 300 ng/mL Phencyclidine (PCP), urine         Cutoff 25 ng/mL Cannabinoid, urine                 Cutoff 50 ng/mL Barbiturates + metabolites, urine  Cutoff 200 ng/mL Benzodiazepine, urine              Cutoff 200 ng/mL Methadone, urine  Cutoff 300 ng/mL The urine drug screen provides only a preliminary, unconfirmed analytical test result and should not be used for non-medical purposes. Clinical consideration and professional judgment should be applied to any positive drug screen result due to possible interfering substances. A  more specific alternate chemical method must be used in order to obtain a confirmed analytical result. Gas chromatography / mass spectrometry (GC/MS) is the preferred confirmat ory method. Performed at [facility], [address] [place], [place] [address]   Glucose, capillary     Status: Abnormal   Collection Time: 10/17/17  2:46 PM  Result Value Ref Range   Glucose-Capillary 150 (H) 65 - 99 mg/dL   Comment 1 Notify RN     No current facility-administered medications for this encounter.    Current Outpatient Medications  Medication Sig Dispense Refill  . blood glucose meter kit and supplies KIT Dispense based on patient and insurance preference. Use up to four times daily as directed. (FOR ICD-9 250.00, 250.01). 1 each 0  . gabapentin (NEURONTIN) 400 MG capsule Take 3 capsules (1,200 mg total) by mouth 3 (three) times daily. 270 capsule 1  . haloperidol decanoate (HALDOL DECANOATE) 100 MG/ML injection Inject 1 mL (100 mg total) into the muscle every 28 (twenty-eight) days. 1 mL 1  . hydrOXYzine (ATARAX/VISTARIL) 25 MG tablet Take 1 tablet by mouth daily.    . insulin aspart (NOVOLOG) 100 UNIT/ML injection Inject 6 Units into the skin 3 (three) times daily with meals. 10 mL 11  . insulin glargine (LANTUS) 100 UNIT/ML injection Inject 0.38 mLs (38 Units total) into the skin every morning. 11.4 mL 0  . mirtazapine (REMERON) 30 MG tablet Take 30 mg by mouth at bedtime.    . traZODone (DESYREL) 100 MG tablet Take 1 tablet (100 mg total) by mouth at bedtime as needed for sleep. 30 tablet 2  . venlafaxine XR (EFFEXOR-XR) 150 MG 24 hr capsule Take 2 capsules (300 mg total) by mouth daily with breakfast. 60 capsule 1  . zolpidem (AMBIEN) 10 MG tablet Take 10 mg by mouth at bedtime as needed for sleep.      Musculoskeletal: Strength & Muscle Tone: within normal limits Gait & Station: normal Patient leans: N/A  Psychiatric Specialty Exam: Physical Exam  Nursing note and vitals  reviewed. Constitutional: He appears well-developed and well-nourished.  HENT:  Head: Normocephalic and atraumatic.  Eyes: Conjunctivae are normal. Pupils are equal, round, and reactive to light.  Neck: Normal range of motion.  Cardiovascular: Regular rhythm and normal heart sounds.  Respiratory: Effort normal. No respiratory distress.  GI: Soft.  Musculoskeletal: Normal range of motion.  Neurological: He is alert.  Skin: Skin is warm and dry.  Psychiatric: His speech is delayed. He is slowed. Cognition and memory are impaired. He expresses impulsivity and inappropriate judgment. He exhibits a depressed mood. He expresses suicidal ideation. He expresses no suicidal plans.    Review of Systems  Constitutional: Negative.   HENT: Negative.   Eyes: Negative.   Respiratory: Negative.   Cardiovascular: Negative.   Gastrointestinal: Negative.   Musculoskeletal: Negative.   Skin: Negative.   Neurological: Negative.   Psychiatric/Behavioral: Positive for depression, substance abuse and suicidal ideas. Negative for hallucinations and memory loss. The patient is nervous/anxious and has insomnia.     Blood pressure 123/83, pulse 90, temperature 98.9 F (37.2 C), temperature source Oral, resp. rate 12, height 6' 2"  (1.88 m), weight 220 lb (99.8 kg), SpO2 99 %.Body mass index is  28.25 kg/m.  General Appearance: Casual  Eye Contact:  Fair  Speech:  Slow  Volume:  Decreased  Mood:  Depressed  Affect:  Constricted  Thought Process:  Goal Directed  Orientation:  Full (Time, Place, and Person)  Thought Content:  Illogical, Rumination and Tangential  Suicidal Thoughts:  Yes.  without intent/plan  Homicidal Thoughts:  No  Memory:  Immediate;   Fair Recent;   Fair Remote;   Fair  Judgement:  Impaired  Insight:  Shallow  Psychomotor Activity:  Decreased  Concentration:  Concentration: Fair  Recall:  [employer] of Knowledge:  Fair  Language:  Fair  Akathisia:  No  Handed:  Right  AIMS (if  indicated):     Assets:  Desire for Improvement Resilience  ADL's:  Intact  Cognition:  Impaired,  Mild  Sleep:        Treatment Plan Summary: Daily contact with patient to assess and evaluate symptoms and progress in treatment, Medication management and Plan 32 year old man with a history of disruptive behavior noncompliance chronic mood symptoms.  Frequent threats to kill himself.  Has not followed through on injuring himself recently.  Patient is passively noncompliant with recommended treatment and attempts to help him.  The ethics committee of the psychiatric service has met and discussed the patient and agreed that inpatient hospitalization is unlikely to be effective.  At this point we will continue usual outpatient medicine and management of diabetes.  I have asked social work to speak with him and they should be available tomorrow.  Reassess tomorrow.  Disposition: Patient does not meet criteria for psychiatric inpatient admission. Supportive therapy provided about ongoing stressors.  Alethia Berthold, MD 10/17/2017 5:23 PM

## 2017-10-17 NOTE — ED Notes (Signed)
Dr. Toni Amendlapacs at pt bedside to speak with pt

## 2017-10-17 NOTE — ED Provider Notes (Signed)
-----------------------------------------   10:09 PM on 10/17/2017 -----------------------------------------  Patient has been seen by psychiatry, they do not believe the patient meets admission criteria for psychiatry.  However psychiatry would like the patient to be seen by social work.  Dr. Toni Amendlapacs called the social worker left a voicemail, but they were not able to see the patient today.  I have placed a social work consult for the social worker to see in the morning.  I do not believe psychiatry was aware that the patient was under an IVC, the IVC is not been rescinded but we will discussed with psychiatry tomorrow for IVC discontinuation and social work consultation.   Minna AntisPaduchowski, Hakeen Shipes, MD 10/17/17 2210

## 2017-10-17 NOTE — ED Notes (Signed)
Ambulatory to the BR with a steady gait  NAD assessed  continue to monitor

## 2017-10-17 NOTE — ED Notes (Signed)
Informed MD Paduchowski of patient's POCT CBG

## 2017-10-17 NOTE — ED Provider Notes (Signed)
[facility] Emergency Department Provider Note       Time seen: ----------------------------------------- 2:29 PM on 10/17/2017 -----------------------------------------   I have reviewed the triage vital signs and the nursing notes.  HISTORY   Chief Complaint No chief complaint on file.    HPI Mark [name] is a 31 y.o. male with a history of diabetes, renal colic, depression, DKA who presents to the ED for possible suicidal ideation.  Police officers received a call from the homeless shelter in reference to a man with a pair of scissors.  Officers arrived to find him with a pair of scissors stating he want to kill himself.  He is brought to the ER for evaluation.  Patient states he still wants to kill himself at this time.  Patient feels helpless due to recently becoming homeless.  He does not want to hurt anyone else.  Past Medical History:  Diagnosis Date  . Diabetes mellitus without complication ([facility])   . Hydrocephalus   . Kidney stones     Patient Active Problem List   Diagnosis Date Noted  . Dysthymia 10/08/2017  . Personality disorder ([facility]) 09/26/2017  . 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 Obstructive hydrocephalus 09/19/2017  . Diabetes ([facility]) 09/11/2017  . Hypoglycemia 09/09/2017  . MDD (major depressive disorder) 08/22/2017  . Severe recurrent major depression with psychotic features ([facility]) 08/19/2017  . Nausea & vomiting 08/18/2017  . Abdominal pain 08/18/2017  . Hyponatremia 08/18/2017  . DKA (diabetic ketoacidoses) ([facility]) 11/24/2015  . Diabetic keto-acidosis ([facility]) 10/23/2015    Past Surgical History:  Procedure Laterality Date  . KIDNEY STONE SURGERY      Allergies Penicillins; Sulfa antibiotics; and Clindamycin/lincomycin  Social History Social History   Tobacco Use  . Smoking status: Current Every Day Smoker    Packs/day: 1.00    Types: Cigarettes  . Smokeless tobacco: Never Used  Substance Use Topics  . Alcohol use: No    Comment: ocasional   . Drug use: Yes    Types: Marijuana    Comment: "as often as I can"    Review of Systems Constitutional: Negative for fever. Cardiovascular: Negative for chest pain. Respiratory: Negative for shortness of breath. Gastrointestinal: Negative for abdominal pain, vomiting and diarrhea. Genitourinary: Negative for dysuria. Musculoskeletal: Negative for back pain. Skin: Negative for rash. Neurological: Negative for headaches, focal weakness or numbness. Psychiatric: Positive for suicidal ideation  All systems negative/normal/unremarkable except as stated in the HPI  ____________________________________________   PHYSICAL EXAM:  VITAL SIGNS: ED Triage Vitals [10/17/17 1416]  Enc Vitals Group     BP 123/83     Pulse Rate 90     Resp 12     Temp 98.9 F (37.2 C)     Temp Source Oral     SpO2 99 %     Weight 220 lb (99.8 kg)     Height 6\' 2"  (1.88 m)     Head Circumference      Peak Flow      Pain Score      Pain Loc      Pain Edu?      Excl. in GC?     Constitutional: Alert and oriented. Well appearing and in no distress. Eyes: Conjunctivae are normal. Normal extraocular movements. ENT   Head: Normocephalic and atraumatic.   Nose: No congestion/rhinnorhea.   Mouth/Throat: Mucous membranes are moist.   Neck: No stridor. Cardiovascular: Normal rate, regular rhythm. No murmurs, rubs, or gallops. Respiratory: Normal respiratory effort  without tachypnea nor retractions. Breath sounds are clear and equal bilaterally. No wheezes/rales/rhonchi. Gastrointestinal: Soft and nontender. Normal bowel sounds Musculoskeletal: Nontender with normal range of motion in extremities. No lower extremity tenderness nor edema. Neurologic:  Normal speech and language. No gross focal neurologic deficits are appreciated.  Skin:  Skin is warm, dry and intact. No rash noted. Psychiatric: Mood and affect are normal. Speech and behavior are normal.  Patient admits  to suicidal thoughts ___________________________________________  ED COURSE:  As part of my medical decision making, I reviewed the following data within the electronic MEDICAL RECORD NUMBER History obtained from family if available, nursing notes, old chart and ekg, as well as notes from prior ED visits. Patient presented for suicidal ideation, we will assess with labs and imaging as indicated at this time.   Procedures ____________________________________________   LABS (pertinent positives/negatives)  Labs Reviewed  COMPREHENSIVE METABOLIC PANEL  ETHANOL  SALICYLATE LEVEL  ACETAMINOPHEN LEVEL  CBC  URINE DRUG SCREEN, QUALITATIVE ([facility] ONLY)   ____________________________________________  DIFFERENTIAL DIAGNOSIS   Suicidal ideation, depression, malingering, homelessness, substance abuse  FINAL ASSESSMENT AND PLAN  suicidal ideation   Plan: Patient had presented for suicidal thought. Patient's labs are unremarkable.  He is stable for psychiatric evaluation and disposition.   Emily FilbertWilliams, Ching Rabideau E, MD   Note: This note was generated in part or whole with voice recognition software. Voice recognition is usually quite accurate but there are transcription errors that can and very often do occur. I apologize for any typographical errors that were not detected and corrected.     Emily FilbertWilliams, Sentoria Brent E, MD 10/17/17 [phone]

## 2017-10-17 NOTE — ED Notes (Signed)
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
## 2017-10-17 NOTE — ED Notes (Signed)
MD Paduchowski informed of patient's POCT CBG. No new orders at this time. RN will continue to monitor

## 2017-10-17 NOTE — ED Notes (Signed)
Patient had episode of urinary incontinence. Patient cleaned and changed. Bedding changed.

## 2017-10-17 NOTE — ED Notes (Signed)
BEHAVIORAL HEALTH ROUNDING Patient sleeping: No. Patient alert and oriented: yes Behavior appropriate: Yes.  ; If no, describe:  Nutrition and fluids offered: yes Toileting and hygiene offered: Yes  Sitter present: q15 minute observations and security  monitoring Law enforcement present: Yes  ODS  

## 2017-10-18 LAB — GLUCOSE, CAPILLARY
GLUCOSE-CAPILLARY: 203 mg/dL — AB (ref 65–99)
Glucose-Capillary: 312 mg/dL — ABNORMAL HIGH (ref 65–99)
Glucose-Capillary: 184 mg/dL — ABNORMAL HIGH (ref 65–99)

## 2017-10-18 MED ORDER — INSULIN GLARGINE 100 UNIT/ML ~~LOC~~ SOLN
15.0000 [IU] | Freq: Once | SUBCUTANEOUS | Status: AC
  Administered 2017-10-18: 15 [IU] via SUBCUTANEOUS
  Filled 2017-10-18: qty 0.15

## 2017-10-18 NOTE — Progress Notes (Signed)
LCSW interviewed breifly with patient who ended consult as he threatened to self harm if I continued to question him. LCSW did obtain verbal consent to speak to his sister Ms Erskine SquibbRhonda Williams- Number not in service and I could call Cassie from [facility] [phone]. Spoke with [facility] peer Environmental managersupport worker and she has agreed to meet with him at [employer]. LCSW called [facility] and left a voice mail message for Maxie BetterJay Baker [phone] [facility]. Awaiting call back.  According to notes patient is IVC- but does not impatient criteria/ and has no medical issues for admission.  Delta Air LinesClaudine Seletha Zimmermann LCSW [phone]

## 2017-10-18 NOTE — Progress Notes (Signed)
Inpatient Diabetes Program Recommendations  AACE/ADA: New Consensus Statement on Inpatient Glycemic Control (2015)  Target Ranges:  Prepandial:   less than 140 mg/dL      Peak postprandial:   less than 180 mg/dL (1-2 hours)      Critically ill patients:  140 - 180 mg/dL   Results for Mark [name], [name] (MRN [id number]) as of 10/18/2017 12:16  Ref. Range 10/17/2017 14:46 10/17/2017 22:01 10/17/2017 23:18 10/18/2017 06:22 10/18/2017 08:56  Glucose-Capillary Latest Ref Range: 65 - 99 mg/dL 409150 (H) 811495 (H) 914422 (H) 203 (H) 312 (H)    Admit with: Suicidal  History: DM, Homelessness  Home DM Meds: Lantus 38 units daily       Novolog 6 units daily  Current Insulin Orders: Lantus 15 units X 1 dose this AM      Novolog 6 units TID      MD- Note patient to receive 15 units Lantus today (one time order).  Please consider the following:  1. Start Lantus 20 units daily (50% total home dose)  2. Start Novolog Moderate Correction Scale/ SSI (0-15 units) TID AC + HS  (Use Glycemic Control Order set)     --Will follow patient during hospitalization--  Ambrose FinlandJeannine Johnston Wileen Duncanson RN, MSN, CDE Diabetes Coordinator Inpatient Glycemic Control Team Team Pager: [phone] (8a-5p)

## 2017-10-18 NOTE — Progress Notes (Signed)
LCSW met with patient who appeared to be better rested and cooperative. He was informed his peer support worker from [place] with [place] and that patient was to call her once he was at shelter. LCSW discussed the part bus and local bus ticket that will assist in transporting him to [place]. He is familiar with resources in  [place]  LCSW received call from [place] his peer support worker and her supervisor will not allow her or any staff to transport this patient. She has agreed to meet the patient once he is placed in the community and he agreed to call her.   Patient was encouraged to follow up with his DSS worker to see where his SSI and medicaid application stands. It was explained as he is competent and  information will only be provided to himself. ( Patient stated he understood) Numbers were provided to patient. Patient stated he is not suicidal or homicidal at this time. Patient shook my hand and I provided his EDRN with all supporting documents.  Consulted with EDRN and EDP and patient will discharge and leave the lobby area.  [employer] LCSW [phone]

## 2017-10-18 NOTE — ED Notes (Signed)
This RN witnessed patient sit down on floor of bathroom while opening door. Patient stating he fell. NT verified no fall heard (NT next to bathroom door). MD Rifenbark notified.

## 2017-10-18 NOTE — ED Notes (Signed)
Patient had episode of urinary incontinence. Patient cleaned. Patient placed in incontinence brief. New pants and socks placed on patient. Patient's linen changed.

## 2017-10-18 NOTE — ED Notes (Signed)
CBG blood glucose 203.

## 2017-10-18 NOTE — Clinical Social Work Note (Signed)
Clinical Social Work Assessment  Patient Details  Name: Mark [name] MRN: [id number] Date of Birth: 04/28/1986  Date of referral:  10/18/17               Reason for consult:  Housing Concerns/Homelessness, Frequent Admissions / ED Visits                Permission sought to share information with:  Family Supports Permission granted to share information::  Yes, Verbal Permission Granted  Name::     Erskine SquibbRhonda Williams sister [phone] ( not in service)  Agency:: [facility] Cassie  Relationship:: Peer support Designer, fashion/clothingworker    Contact Information:   [phone]  Housing/Transportation Living arrangements for the past 2 months:  No permanent address, Homeless Source of Information:  Patient Patient Interpreter Needed:  None Criminal Activity/Legal Involvement Pertinent to Current Situation/Hospitalization:  No - Comment as needed Significant Relationships:  None Lives with:  Self Do you feel safe going back to the place where you live?  No Need for family participation in patient care:  No (Coment)  Care giving concerns: patient reports he isnt allowed to stay with sister.   Social Worker assessment / plan: LCSW introduced myself to patient who was very reluctant to talk to me. He reports he has no place to go and he will kill himself. He would not reveal a plan and stated I would stop him. I explained we would like him to be safe in the community. He stated he is attached to [facility] Cassie. He gave verbal consent to speak to his sister and [facility]. I called the number for his sister and its disconnected. Called [facility] and spoke to Redwayassie who has made several attempts to support patient in the community. [facility] and [facility] ( awaiting call back) Patient has no income or insurance. He has applied for SSI. 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 Patient ended our assessment. He was angry and agitated and started to threaten with self  harm. Employment status:  Engineer, maintenanceUnemployed Insurance information:   None PT Recommendations:  Not assessed at this time Information / Referral to community resources:     Patient/Family's Response to care: Unable to reach sister ( number not connected)   Patient/Family's Understanding of and Emotional Response to Diagnosis, Current Treatment, and Prognosis: He reports he is suicidal and if discharged he will kill himself  Emotional Assessment Appearance:  Disheveled Attitude/Demeanor/Rapport:    Affect (typically observed):  Angry, Agitated Orientation:  Oriented to Self, Oriented to Place, Oriented to  Time, Oriented to Situation Alcohol / Substance use:  Not Applicable Psych involvement (Current and /or in the community):  Yes (Comment)([facility] )  Discharge Needs  Concerns to be addressed:  Care Coordination, Basic Needs Readmission within the last 30 days:  Yes Current discharge risk:  Inadequate Financial Supports, Psychiatric Illness, Homeless Barriers to Discharge:  Other(Patient does not follow up with community supports)   Cheron SchaumannBandi, Kenleigh Toback M, LCSW 10/18/2017, 7:48 AM

## 2017-10-18 NOTE — Progress Notes (Signed)
As per Dr Marton Redwoodlapacs LCSW and ED Psychiatrist consulted. Patient is to discharge and to follow up with his peer support worker Cassie with [facility]. Spoke with Cassie and her plan is to come to hospital to engage with patient. I explained that both [facility] have [facility] this patient and he will need to access [place] resources, She will call me when she is on her way.   Will review resources in [place]- LCSW will provide him with addresses for shelter in [facility], [facility] and [facility] [place]. I have left requests for admissions staff to call me back and continue to await call backs today.   2 Bus tickets and Part bus pass so [place].  Delta Air LinesClaudine Marissah Vandemark LCSW [phone]

## 2017-10-18 NOTE — ED Provider Notes (Signed)
Dr. Mat Carnelay packs on the patient and cleared him he will go and meet his P her counselor go [facility] [place] [facility]   Arnaldo NatalMalinda, Paul F, MD 10/18/17 [phone]

## 2017-10-18 NOTE — ED Provider Notes (Signed)
-----------------------------------------   7:18 AM on 10/18/2017 -----------------------------------------   Blood pressure 120/73, pulse 68, temperature (!) 97.4 F (36.3 C), temperature source Oral, resp. rate 16, height 6\' 2"  (1.88 m), weight 99.8 kg (220 lb), SpO2 99 %.  The patient had no acute events since last update.  Calm and cooperative at this time.  Disposition is pending Psychiatry/Behavioral Medicine team recommendations.     Arnaldo NatalMalinda, Alixandrea Milleson F, MD 10/18/17 [id number]

## 2017-10-18 NOTE — Discharge Instructions (Signed)
I'll [place] [facility] [place] Product/process development scientistworker Cassie for [employer] and [facility]. Please return as needed.

## 2017-10-18 NOTE — Consult Note (Signed)
[facility] Consult   Reason for Consult: Consult for 31 year old man with a history of recurrent behavior problems depressive symptoms and medical problems and major social difficulties comes to the emergency room after claiming to be suicidal at the homeless shelter Referring Physician: Paduchowski Patient Identification: Mark [name] MRN:  [id number] Principal Diagnosis: Personality disorder [facility]) Diagnosis:   Patient Active Problem List   Diagnosis Date Noted  . Noncompliance [Z91.19] 10/17/2017  . Dysthymia [F34.1] 10/08/2017  . Personality disorder ([place]) [F60.9] 09/26/2017  . 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 Obstructive hydrocephalus [G91.1] 09/19/2017  . Diabetes ([place]) [E11.9] 09/11/2017  . Hypoglycemia [E16.2] 09/09/2017  . MDD (major depressive disorder) [F32.9] 08/22/2017  . 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 Nausea & vomiting [R11.2] 08/18/2017  . Abdominal pain [R10.9] 08/18/2017  . Hyponatremia [E87.1] 08/18/2017  . DKA (diabetic ketoacidoses) ([place]) [E13.10] 11/24/2015  . Diabetic keto-acidosis ([place]) [E13.10] 10/23/2015    Total Time spent with patient: 20 minutes  Subjective:   Mark [name] is a 31 y.o. male patient admitted with "I am fed up".  Updated note for Friday the 28th.  Patient seen.  Case reviewed with social work and emergency room physician.  Patient has made statements in the last day still about having suicidal thoughts but has been cooperative with treatment and has not been acting out.  Affect seems to have calm down.  On conversation today he is not reporting acute suicidal intent or plan and has been more cooperative with social work.  HPI: Patient interviewed chart reviewed.  Patient well known from previous encounters.  31 year old man was brought from the shelter today.  He says he had been there for less than a day.  He claims that he was watching television when he saw  a television commercial that made him sad.  When he saw this he went and got a pair of scissors and poked himself in the abdomen although when he shows me his abdomen there is no sign of any cut.  Apparently he then and went and told the staff at the shelter and they brought him over here and at the same time told him he was banned from the shelter.  It has been a couple weeks since we saw Mark [name].  He claims that he has been living outdoors and that time although that seems a little hard to believe.  He will not go into any more detail.  As usual he says that he is severely depressed and really wants to die and thinks he will definitely kill himself.  He says he has been taking care of his diabetes and taking his insulin but has taken none of his other medicine and has not gone for any mental health treatment.  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  Social history: Unfortunate situation that he does not receive any disability or have any benefits.  No longer able to work apparently.  His mother passed away this year and has left him without any family support.  Medical history: History of hydrocephalus also chronic diabetes with multiple episodes of noncompliance and presentation with diabetic ketoacidosis.  Substance abuse history: Recurrent use of marijuana denies other drug use.  Past Psychiatric History: Patient has had multiple encounters psychiatrically and has had previous admissions.  On previous admissions to the psychiatric ward he has been disruptive argumentative non-cooperative and very resistant to any kind of treatment.  Does not follow up with recommended outpatient treatment.  He does have a history of suicidal threats and self injuring behavior in the past.  No evidence of any history of psychosis.  Probably has some cognitive problems chronically.  Risk to Self: Suicidal Ideation: Yes-Currently Present Suicidal Intent: No Is patient at risk for suicide?: No Suicidal Plan?: No Specify  Current Suicidal Plan: n/a Access to Means: No Specify Access to Suicidal Means: n/a What has been your use of drugs/alcohol within the last 12 months?: Cannabis How many times?: 5 Other Self Harm Risks: Reports of none Triggers for Past Attempts: Unpredictable Intentional Self Injurious Behavior: None Risk to Others: Homicidal Ideation: No Thoughts of Harm to Others: No Current Homicidal Intent: No Current Homicidal Plan: No Access to Homicidal Means: No Identified Victim: Reports of none History of harm to others?: No Assessment of Violence: None Noted Violent Behavior Description: Reports of none Does patient have access to weapons?: No Criminal Charges Pending?: No Does patient have a court date: No Prior Inpatient Therapy: Prior Inpatient Therapy: Yes Prior Therapy Dates: numerous, most recent November 2018 Prior Therapy Facilty/Provider(s): [place], [facility], [facility] Reason for Treatment: depression and psychosis Prior Outpatient Therapy: Prior Outpatient Therapy: Yes Prior Therapy Dates: current Prior Therapy Facilty/Provider(s): [facility] Reason for Treatment: Schizoaffective Disorder Does patient have an ACCT team?: No Does patient have Intensive In-House Services?  : No Does patient have [facility] services? : No Does patient have P4CC services?: No  Past Medical History:  Past Medical History:  Diagnosis Date  . Diabetes mellitus without complication ([place])   . Hydrocephalus   . Kidney stones     Past Surgical History:  Procedure Laterality Date  . KIDNEY STONE SURGERY     Family History:  Family History  Problem Relation Age of Onset  . Breast cancer Mother    Family Psychiatric  History: Unknown Social History:  Social History   Substance and Sexual Activity  Alcohol Use No   Comment: ocasional      Social History   Substance and Sexual Activity  Drug Use Yes  . Types: Marijuana   Comment: "as often as I can"    Social History   Socioeconomic History   . Marital status: Single    Spouse name: None  . Number of children: None  . Years of education: None  . Highest education level: None  Social Needs  . Financial resource strain: None  . Food insecurity - worry: None  . Food insecurity - inability: None  . Transportation needs - medical: None  . Transportation needs - non-medical: None  Occupational History  . None  Tobacco Use  . Smoking status: Current Every Day Smoker    Packs/day: 1.00    Types: Cigarettes  . Smokeless tobacco: Never Used  Substance and Sexual Activity  . Alcohol use: No    Comment: ocasional   . Drug use: Yes    Types: Marijuana    Comment: "as often as I can"  . Sexual activity: None  Other Topics Concern  . None  Social History Narrative  . None   Additional Social History:    Allergies:   Allergies  Allergen Reactions  . Penicillins Anaphylaxis, Hives and Swelling    Has patient had a PCN reaction causing immediate rash, facial/tongue/throat swelling, SOB or lightheadedness with hypotension: Yes Has patient had a PCN reaction causing severe rash involving mucus membranes or skin necrosis: No Has patient had a PCN reaction that required hospitalization Yes Has patient had a PCN reaction occurring  within the last 10 years: Yes If all of the above answers are "NO", then may proceed with Cephalosporin use.  . Sulfa Antibiotics Anaphylaxis and Hives  . Clindamycin/Lincomycin Hives    Labs:  Results for orders placed or performed during the hospital encounter of 10/17/17 (from the past 48 hour(s))  Comprehensive metabolic panel     Status: Abnormal   Collection Time: 10/17/17  2:14 PM  Result Value Ref Range   Sodium 137 135 - 145 mmol/L   Potassium 3.3 (L) 3.5 - 5.1 mmol/L   Chloride 98 (L) 101 - 111 mmol/L   CO2 31 22 - 32 mmol/L   Glucose, Bld 154 (H) 65 - 99 mg/dL   BUN 14 6 - 20 mg/dL   Creatinine, Ser 0.90 0.61 - 1.24 mg/dL   Calcium 9.8 8.9 - 10.3 mg/dL   Total Protein 8.5 (H) 6.5  - 8.1 g/dL   Albumin 4.9 3.5 - 5.0 g/dL   AST 23 15 - 41 U/L   ALT 13 (L) 17 - 63 U/L   Alkaline Phosphatase 129 (H) 38 - 126 U/L   Total Bilirubin 0.5 0.3 - 1.2 mg/dL   GFR calc non Af Amer >60 >60 mL/min   GFR calc Af Amer >60 >60 mL/min    Comment: (NOTE) The eGFR has been calculated using the CKD EPI equation. This calculation has not been validated in all clinical situations. eGFR's persistently <60 mL/min signify possible Chronic Kidney Disease.    Anion gap 8 5 - 15    Comment: Performed at [facility], [address] [place], [place] [address]  Ethanol     Status: None   Collection Time: 10/17/17  2:14 PM  Result Value Ref Range   Alcohol, Ethyl (B) <10 <10 mg/dL    Comment:        LOWEST DETECTABLE LIMIT FOR SERUM ALCOHOL IS 10 mg/dL FOR MEDICAL PURPOSES ONLY Performed at [facility], [address] [place], [place] [address]   Salicylate level     Status: None   Collection Time: 10/17/17  2:14 PM  Result Value Ref Range   Salicylate Lvl <7.8 2.8 - 30.0 mg/dL    Comment: Performed at [facility], [address] [place], [place] [address]  Acetaminophen level     Status: Abnormal   Collection Time: 10/17/17  2:14 PM  Result Value Ref Range   Acetaminophen (Tylenol), Serum <10 (L) 10 - 30 ug/mL    Comment:        THERAPEUTIC CONCENTRATIONS VARY SIGNIFICANTLY. A RANGE OF 10-30 ug/mL MAY BE AN EFFECTIVE CONCENTRATION FOR MANY PATIENTS. HOWEVER, SOME ARE BEST TREATED AT CONCENTRATIONS OUTSIDE THIS RANGE. ACETAMINOPHEN CONCENTRATIONS >150 ug/mL AT 4 HOURS AFTER INGESTION AND >50 ug/mL AT 12 HOURS AFTER INGESTION ARE OFTEN ASSOCIATED WITH TOXIC REACTIONS. Performed at [facility], [address] [place], [place] [address]   cbc     Status: Abnormal   Collection Time: 10/17/17  2:14 PM  Result Value Ref Range   WBC 10.9 (H) 3.8 - 10.6 K/uL   RBC 4.42 4.40 - 5.90 MIL/uL   Hemoglobin 14.6 13.0 - 18.0 g/dL   HCT  42.2 40.0 - 52.0 %   MCV 95.6 80.0 - 100.0 fL   MCH 33.0 26.0 - 34.0 pg   MCHC 34.6 32.0 - 36.0 g/dL   RDW 12.0 11.5 - 14.5 %   Platelets 289 150 - 440 K/uL    Comment: Performed at [employer]  [facility], [address] [place], [place] [address]  Urine Drug Screen, Qualitative     Status: Abnormal   Collection Time: 10/17/17  2:14 PM  Result Value Ref Range   Tricyclic, Ur Screen NONE DETECTED NONE DETECTED   Amphetamines, Ur Screen NONE DETECTED NONE DETECTED   MDMA (Ecstasy)Ur Screen NONE DETECTED NONE DETECTED   Cocaine Metabolite,Ur [place] NONE DETECTED NONE DETECTED   Opiate, Ur Screen NONE DETECTED NONE DETECTED   Phencyclidine (PCP) Ur S NONE DETECTED NONE DETECTED   Cannabinoid 50 Ng, Ur  POSITIVE (A) NONE DETECTED   Barbiturates, Ur Screen NONE DETECTED NONE DETECTED   Benzodiazepine, Ur Scrn NONE DETECTED NONE DETECTED   Methadone Scn, Ur NONE DETECTED NONE DETECTED    Comment: (NOTE) Tricyclics + metabolites, urine    Cutoff 1000 ng/mL Amphetamines + metabolites, urine  Cutoff 1000 ng/mL MDMA (Ecstasy), urine              Cutoff 500 ng/mL Cocaine Metabolite, urine          Cutoff 300 ng/mL Opiate + metabolites, urine        Cutoff 300 ng/mL Phencyclidine (PCP), urine         Cutoff 25 ng/mL Cannabinoid, urine                 Cutoff 50 ng/mL Barbiturates + metabolites, urine  Cutoff 200 ng/mL Benzodiazepine, urine              Cutoff 200 ng/mL Methadone, urine                   Cutoff 300 ng/mL The urine drug screen provides only a preliminary, unconfirmed analytical test result and should not be used for non-medical purposes. Clinical consideration and professional judgment should be applied to any positive drug screen result due to possible interfering substances. A more specific alternate chemical method must be used in order to obtain a confirmed analytical result. Gas chromatography / mass spectrometry (GC/MS) is the preferred confirmat ory method. Performed at  [facility], [address] [place], [place] [address]   Glucose, capillary     Status: Abnormal   Collection Time: 10/17/17  2:46 PM  Result Value Ref Range   Glucose-Capillary 150 (H) 65 - 99 mg/dL   Comment 1 Notify RN   Glucose, capillary     Status: Abnormal   Collection Time: 10/17/17 10:01 PM  Result Value Ref Range   Glucose-Capillary 495 (H) 65 - 99 mg/dL  Glucose, capillary     Status: Abnormal   Collection Time: 10/17/17 11:18 PM  Result Value Ref Range   Glucose-Capillary 422 (H) 65 - 99 mg/dL  Glucose, capillary     Status: Abnormal   Collection Time: 10/18/17  6:22 AM  Result Value Ref Range   Glucose-Capillary 203 (H) 65 - 99 mg/dL   Comment 1 Notify RN    Comment 2 Document in Chart   Glucose, capillary     Status: Abnormal   Collection Time: 10/18/17  8:56 AM  Result Value Ref Range   Glucose-Capillary 312 (H) 65 - 99 mg/dL  Glucose, capillary     Status: Abnormal   Collection Time: 10/18/17 12:38 PM  Result Value Ref Range   Glucose-Capillary 184 (H) 65 - 99 mg/dL    No current facility-administered medications for this encounter.    Current Outpatient Medications  Medication Sig Dispense Refill  . gabapentin (NEURONTIN) 400 MG capsule Take 3 capsules (1,200  mg total) by mouth 3 (three) times daily. 270 capsule 1  . haloperidol decanoate (HALDOL DECANOATE) 100 MG/ML injection Inject 1 mL (100 mg total) into the muscle every 28 (twenty-eight) days. 1 mL 1  . hydrOXYzine (ATARAX/VISTARIL) 25 MG tablet Take 1 tablet by mouth daily.    . insulin aspart (NOVOLOG) 100 UNIT/ML injection Inject 6 Units into the skin 3 (three) times daily with meals. 10 mL 11  . insulin glargine (LANTUS) 100 UNIT/ML injection Inject 0.38 mLs (38 Units total) into the skin every morning. 11.4 mL 0  . mirtazapine (REMERON) 30 MG tablet Take 30 mg by mouth at bedtime.    . traZODone (DESYREL) 100 MG tablet Take 1 tablet (100 mg total) by mouth at bedtime as needed for  sleep. 30 tablet 2  . venlafaxine XR (EFFEXOR-XR) 150 MG 24 hr capsule Take 2 capsules (300 mg total) by mouth daily with breakfast. 60 capsule 1  . zolpidem (AMBIEN) 10 MG tablet Take 10 mg by mouth at bedtime as needed for sleep.    . blood glucose meter kit and supplies KIT Dispense based on patient and insurance preference. Use up to four times daily as directed. (FOR ICD-9 250.00, 250.01). 1 each 0    Musculoskeletal: Strength & Muscle Tone: within normal limits Gait & Station: normal Patient leans: N/A  Psychiatric Specialty Exam: Physical Exam  Nursing note and vitals reviewed. Constitutional: He appears well-developed and well-nourished.  HENT:  Head: Normocephalic and atraumatic.  Eyes: Conjunctivae are normal. Pupils are equal, round, and reactive to light.  Neck: Normal range of motion.  Cardiovascular: Regular rhythm and normal heart sounds.  Respiratory: Effort normal. No respiratory distress.  GI: Soft.  Musculoskeletal: Normal range of motion.  Neurological: He is alert.  Skin: Skin is warm and dry.  Psychiatric: His speech is delayed. He is slowed. Cognition and memory are impaired. He expresses impulsivity and inappropriate judgment. He exhibits a depressed mood. He expresses suicidal ideation. He expresses no suicidal plans.    Review of Systems  Constitutional: Negative.   HENT: Negative.   Eyes: Negative.   Respiratory: Negative.   Cardiovascular: Negative.   Gastrointestinal: Negative.   Musculoskeletal: Negative.   Skin: Negative.   Neurological: Negative.   Psychiatric/Behavioral: Positive for depression, substance abuse and suicidal ideas. Negative for hallucinations and memory loss. The patient is nervous/anxious and has insomnia.     Blood pressure 116/74, pulse 68, temperature 98.6 F (37 C), temperature source Oral, resp. rate 18, height _0  (1.88 m), weight 220 lb (99.8 kg), SpO2 100 %.Body mass index is 28.25 kg/m.  General Appearance: Casual   Eye Contact:  Fair  Speech:  Slow  Volume:  Decreased  Mood:  Depressed  Affect:  Constricted  Thought Process:  Goal Directed  Orientation:  Full (Time, Place, and Person)  Thought Content:  Illogical, Rumination and Tangential  Suicidal Thoughts:  Yes.  without intent/plan  Homicidal Thoughts:  No  Memory:  Immediate;   Fair Recent;   Fair Remote;   Fair  Judgement:  Impaired  Insight:  Shallow  Psychomotor Activity:  Decreased  Concentration:  Concentration: Fair  Recall:  [employer] of Knowledge:  Fair  Language:  Fair  Akathisia:  No  Handed:  Right  AIMS (if indicated):     Assets:  Desire for Improvement Resilience  ADL's:  Intact  Cognition:  Impaired,  Mild  Sleep:        Treatment Plan Summary: Daily  contact with patient to assess and evaluate symptoms and progress in treatment, Medication management and Plan As noted previously this gentleman is not likely to benefit from inpatient hospitalization.  He makes frequent suicidal statements but has not carried through on them and clearly seems to be manipulating for secondary gain.  Patient has been instructed about multiple options of shelters in the area and has been referred back to outpatient treatment and has a caseworker who will be assisting him.  He is off of involuntary commitment and being released today.  Patient had no complaints about the plan and was cooperative.  Case reviewed with TTS ER physician and social work.  Disposition: Patient does not meet criteria for psychiatric inpatient admission. Supportive therapy provided about ongoing stressors.  Alethia Berthold, MD 10/18/2017 7:54 PM

## 2017-10-18 NOTE — ED Notes (Signed)
Pt transported via [facility] to the bus stop.

## 2017-10-18 NOTE — ED Notes (Signed)
CBG : 312 

## 2017-10-18 NOTE — ED Notes (Signed)
Social Worker at bedside.

## 2017-10-18 NOTE — Progress Notes (Signed)
LCSW received a call from Mark [name] and this patient is not able to return to the shelter [facility] nor is able to go to rescue mission. He locked himself in  bathroom yesterday at shelter with scissors and pills attempting to take medication to kill himself. He was supported several times by shelter staff and encouraged to call his Production designer, theatre/television/filmcommunity  Worker.  Vonna KotykJay will have DSS worker call LCSW to update patients status on medicaid and SSI application. Will advise ED staff  [facility] Asc LLCClaudine Eliyas Suddreth LCSW [phone]

## 2017-11-07 ENCOUNTER — Ambulatory Visit (HOSPITAL_COMMUNITY)
Admission: RE | Admit: 2017-11-07 | Discharge: 2017-11-07 | Disposition: A | Discharge status: Home / Self Care | Payer: Self-pay | Attending: Psychiatry | Admitting: Psychiatry

## 2017-11-07 DIAGNOSIS — F4321 Adjustment disorder with depressed mood: Secondary | ICD-10-CM | POA: Insufficient documentation

## 2017-11-07 NOTE — H&P (Signed)
Behavioral Health Medical Screening Exam  Mark [name] is an 32 y.o. male patient [facility] to [facility]; brought in by social worker intern with complaints of suicidal ideation.  Patient states that [facility] has [facility] [place].  Patient states that he has no transportation and is unable to get to an outpatient services if not walking distance.  Patient stated that he did have a knife on him but was informed that [facility] if he turned the knife in.  Stated that there was no struggle and he was not trying to kill himself.  Patient denies suicidal/homicidal/self-harm ideation, psychosis, and paranoia.  Patient needing outpatient services that he is able to walk to.    Total Time spent with patient: 45 minutes  Psychiatric Specialty Exam: Physical Exam  Vitals reviewed. Constitutional: He is oriented to person, place, and time. He appears well-developed.  Neck: Normal range of motion. Neck supple.  Cardiovascular: Normal rate and regular rhythm.  Respiratory: Effort normal and breath sounds normal.  Musculoskeletal: Normal range of motion.  Neurological: He is alert and oriented to person, place, and time.  Skin: Skin is warm and dry.    Review of Systems  Psychiatric/Behavioral: Positive for depression. Negative for hallucinations and suicidal ideas. Substance abuse: THC. The patient is not nervous/anxious and does not have insomnia.     Blood pressure 119/69, pulse 83, temperature 98.6 F (37 C), resp. rate 18, SpO2 100 %.There is no height or weight on file to calculate BMI.  General Appearance: Casual  Eye Contact:  Good  Speech:  Clear and Coherent and Normal Rate  Volume:  Normal  Mood:  Depressed  Affect:  Appropriate  Thought Process:  Coherent and Goal Directed  Orientation:  Full (Time, Place, and Person)  Thought Content:  Denies hallucinations, delusions, and paranoia  Suicidal Thoughts:  No  Homicidal Thoughts:  No   Memory:  Immediate;   Fair Recent;   Fair Remote;   Fair  Judgement:  Intact  Insight:  Fair  Psychomotor Activity:  Normal  Concentration: Concentration: Good and Attention Span: Good  Recall:  Good  Fund of Knowledge:Fair  Language: Good  Akathisia:  No  Handed:  Right  AIMS (if indicated):     Assets:  Communication Skills Desire for Improvement  Sleep:       Musculoskeletal: Strength & Muscle Tone: within normal limits Gait & Station: normal Patient leans: N/A  Blood pressure 119/69, pulse 83, temperature 98.6 F (37 C), resp. rate 18, SpO2 100 %.  Recommendations:  Referral to [facility], which [facility]   Based on my evaluation the patient does not appear to have an emergency medical condition.   Disposition: No evidence of imminent risk to self or others at present.   Patient does not meet criteria for psychiatric inpatient admission. Discussed crisis plan, support from social network, calling 911, coming to the Emergency Department, and calling Suicide Hotline.  Shuvon Rankin, NP 11/07/2017, 6:24 PM
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
## 2017-11-07 NOTE — BH Assessment (Signed)
[facility] Assessment Progress Note  This Clinical research associatewriter received a call from Hope PigeonJames Pierce [phone]) [facility] [place].  He reports that this pt presents at their facility today with a knife, threatening to stab himself in the abdomen.  After the knife was secured the pt continued to endorse SI with plan to run into traffic.  Mr Jeanella Crazeierce adds that pt presented at [facility] yesterday complaining of SI, but pt was discharged.  He reports that an intern will be transporting pt to us.  After placing several notification calls to triage [facility] and [facility], I found that pt had presented [facility], where I had also called, notifying TTS staff.  At 15:15 I called back to Mr Jeanella Crazeierce, notifying him of pt's arrival, adding that pt will need to sign Consent to Release Information to him in order for any further details to be divulged.  Doylene Canninghomas Aubriana Ravelo, [place] Behavioral Health Coordinator [phone]

## 2017-12-06 ENCOUNTER — Encounter (HOSPITAL_COMMUNITY): Payer: Self-pay | Admitting: Emergency Medicine

## 2017-12-06 ENCOUNTER — Emergency Department (HOSPITAL_COMMUNITY)
Admission: EM | Admit: 2017-12-06 | Discharge: 2017-12-07 | Disposition: A | Discharge status: Home / Self Care | Payer: Self-pay | Attending: Emergency Medicine | Admitting: Emergency Medicine

## 2017-12-06 ENCOUNTER — Other Ambulatory Visit: Payer: Self-pay

## 2017-12-06 DIAGNOSIS — F4321 Adjustment disorder with depressed mood: Secondary | ICD-10-CM | POA: Diagnosis present

## 2017-12-06 DIAGNOSIS — F17210 Nicotine dependence, cigarettes, uncomplicated: Secondary | ICD-10-CM | POA: Insufficient documentation

## 2017-12-06 DIAGNOSIS — F332 Major depressive disorder, recurrent severe without psychotic features: Secondary | ICD-10-CM | POA: Insufficient documentation

## 2017-12-06 DIAGNOSIS — F1290 Cannabis use, unspecified, uncomplicated: Secondary | ICD-10-CM | POA: Insufficient documentation

## 2017-12-06 DIAGNOSIS — R45851 Suicidal ideations: Secondary | ICD-10-CM | POA: Insufficient documentation

## 2017-12-06 DIAGNOSIS — Z79899 Other long term (current) drug therapy: Secondary | ICD-10-CM | POA: Insufficient documentation

## 2017-12-06 DIAGNOSIS — E119 Type 2 diabetes mellitus without complications: Secondary | ICD-10-CM | POA: Insufficient documentation

## 2017-12-06 DIAGNOSIS — Z794 Long term (current) use of insulin: Secondary | ICD-10-CM | POA: Insufficient documentation

## 2017-12-06 LAB — COMPREHENSIVE METABOLIC PANEL
ALT: 17 U/L (ref 17–63)
AST: 22 U/L (ref 15–41)
Albumin: 4.3 g/dL (ref 3.5–5.0)
Alkaline Phosphatase: 92 U/L (ref 38–126)
Anion gap: 9 (ref 5–15)
BUN: 12 mg/dL (ref 6–20)
CHLORIDE: 105 mmol/L (ref 101–111)
CO2: 27 mmol/L (ref 22–32)
CREATININE: 0.81 mg/dL (ref 0.61–1.24)
Calcium: 9.6 mg/dL (ref 8.9–10.3)
GFR calc Af Amer: >60 mL/min (ref >60)
GFR calc non Af Amer: >60 mL/min (ref >60)
Glucose, Bld: 77 mg/dL (ref 65–99)
POTASSIUM: 3.5 mmol/L (ref 3.5–5.1)
SODIUM: 141 mmol/L (ref 135–145)
Total Bilirubin: 0.3 mg/dL (ref 0.3–1.2)
Total Protein: 7.4 g/dL (ref 6.5–8.1)

## 2017-12-06 LAB — CBC
HCT: 37 % — ABNORMAL LOW (ref 39.0–52.0)
HEMOGLOBIN: 12.9 g/dL — AB (ref 13.0–17.0)
MCH: 31.9 pg (ref 26.0–34.0)
MCHC: 34.9 g/dL (ref 30.0–36.0)
MCV: 91.4 fL (ref 78.0–100.0)
PLATELETS: 353 10*3/uL (ref 150–400)
RBC: 4.05 MIL/uL — AB (ref 4.22–5.81)
RDW: 11.9 % (ref 11.5–15.5)
WBC: 10.4 10*3/uL (ref 4.0–10.5)

## 2017-12-06 LAB — RAPID URINE DRUG SCREEN, HOSP PERFORMED
AMPHETAMINES: NOT DETECTED
BENZODIAZEPINES: NOT DETECTED
Barbiturates: NOT DETECTED
Cocaine: NOT DETECTED
Opiates: NOT DETECTED
TETRAHYDROCANNABINOL: NOT DETECTED

## 2017-12-06 LAB — CBG MONITORING, ED
GLUCOSE-CAPILLARY: 56 mg/dL — AB (ref 65–99)
GLUCOSE-CAPILLARY: 54 mg/dL — AB (ref 65–99)
GLUCOSE-CAPILLARY: 71 mg/dL (ref 65–99)
Glucose-Capillary: 280 mg/dL — ABNORMAL HIGH (ref 65–99)

## 2017-12-06 LAB — ACETAMINOPHEN LEVEL: Acetaminophen (Tylenol), Serum: <10 ug/mL — ABNORMAL LOW (ref 10–30)

## 2017-12-06 LAB — ETHANOL

## 2017-12-06 LAB — SALICYLATE LEVEL

## 2017-12-06 MED ORDER — INSULIN GLARGINE 100 UNIT/ML ~~LOC~~ SOLN
38.0000 [IU] | SUBCUTANEOUS | Status: DC
  Administered 2017-12-07: 38 [IU] via SUBCUTANEOUS
  Filled 2017-12-06: qty 0.38

## 2017-12-06 MED ORDER — ONDANSETRON HCL 4 MG PO TABS: 4.0000 mg | ORAL_TABLET | Freq: Three times a day (TID) | ORAL | Status: DC | PRN

## 2017-12-06 MED ORDER — TRAZODONE HCL 100 MG PO TABS
100.0000 mg | ORAL_TABLET | Freq: Every evening | ORAL | Status: DC | PRN
  Administered 2017-12-06: 100 mg via ORAL
  Filled 2017-12-06: qty 1

## 2017-12-06 MED ORDER — INSULIN ASPART PROT & ASPART (70-30 MIX) 100 UNIT/ML ~~LOC~~ SUSP
6.0000 [IU] | SUBCUTANEOUS | Status: DC
  Filled 2017-12-06: qty 10

## 2017-12-06 MED ORDER — ALUM & MAG HYDROXIDE-SIMETH 200-200-20 MG/5ML PO SUSP: 30.0000 mL | Freq: Four times a day (QID) | ORAL | Status: DC | PRN

## 2017-12-06 MED ORDER — INSULIN ASPART 100 UNIT/ML ~~LOC~~ SOLN
12.0000 [IU] | Freq: Three times a day (TID) | SUBCUTANEOUS | Status: DC
  Administered 2017-12-07: 12 [IU] via SUBCUTANEOUS
  Filled 2017-12-06: qty 1

## 2017-12-06 MED ORDER — NICOTINE 21 MG/24HR TD PT24
21.0000 mg | MEDICATED_PATCH | Freq: Every day | TRANSDERMAL | Status: DC
  Administered 2017-12-06 – 2017-12-07 (×2): 21 mg via TRANSDERMAL
  Filled 2017-12-06 (×2): qty 1

## 2017-12-06 MED ORDER — INSULIN ASPART 100 UNIT/ML ~~LOC~~ SOLN
6.0000 [IU] | SUBCUTANEOUS | Status: AC
  Administered 2017-12-06: 6 [IU] via SUBCUTANEOUS
  Filled 2017-12-06: qty 1

## 2017-12-06 MED ORDER — ACETAMINOPHEN 325 MG PO TABS
650.0000 mg | ORAL_TABLET | ORAL | Status: DC | PRN
  Administered 2017-12-06: 650 mg via ORAL
  Filled 2017-12-06: qty 2

## 2017-12-06 MED ORDER — VENLAFAXINE HCL ER 75 MG PO CP24
300.0000 mg | ORAL_CAPSULE | Freq: Every day | ORAL | Status: DC
  Administered 2017-12-07: 300 mg via ORAL
  Filled 2017-12-06: qty 4

## 2017-12-06 MED ORDER — ZOLPIDEM TARTRATE 5 MG PO TABS
5.0000 mg | ORAL_TABLET | Freq: Every evening | ORAL | Status: DC | PRN
  Administered 2017-12-06: 5 mg via ORAL
  Filled 2017-12-06: qty 1

## 2017-12-06 MED ORDER — INSULIN ASPART 100 UNIT/ML FLEXPEN
12.0000 [IU] | PEN_INJECTOR | Freq: Three times a day (TID) | SUBCUTANEOUS | Status: DC
  Filled 2017-12-06: qty 3

## 2017-12-06 NOTE — BH Assessment (Addendum)
Assessment Note  Mark [name] is an 32 y.o. male that presents this date voluntary with thoughts of self harm with a plan to overdose on his insulin. Patient states he is currently homeless and just "tired of living." Patient renders limited history and is a poor historian. Patient is observed to be very drowsy and states "you already have all of this." Patient denies any H/I or AVH. Patient is oriented to time/place and is noted to be irritable during the assessment. Patient admits to ongoing Cannabis use but is vague in reference to details or last use. Patient does report he has upcoming possession charges on 01/02/18. Patient denies having a current OP provider or being on any MH medications. When asked to identify current symptoms patient states "depression, heartache and life." Patient admits to 4 prior attempts at self harm but again is vague in reference to details, hospital or time frame. Per note review patient has been admitted to several facilities in 2018 to include [facility] and [facility] presenting with similar symptoms associated with thoughts of self harm. Note review this date states, "Patient is known to have a history of malingering, who has been seen at this hospital and multiple local EDs with suicidal ideation and hyperglycemia. He is homeless living at  homeless shelter. He is here with suicidal ideation. He was discharged from a local ED earlier today for evaluation of hyperglycemia and malingering. This is his fourth ER visit within this week. Patient report he has been feeling suicidal ongoing for the past week. He states he has no desire to live. He tries to harm himself by cutting his left wrist a week ago. He admits he has been seen at an outside hospital for his symptoms but states that "they did not do anything for me". He is here requesting help with his suicidal ideation. He admits that he is homeless and does not know his way around. He did admit receiving prescription for his medication  but unable to fill it. He denies having homicidal ideation, auditory or visual hallucination. Patient states he has not been taking his psychiatric medication for more than a month". Case was staffed with Shaune PollackLord DNP who recommended patient be monitored and observed for safety. Patient will be seen by psychiatry in the a.m.     Diagnosis: F33.2 MDD recurrent severe without psychotic features, Cannabis use  Past Medical History:  Past Medical History:  Diagnosis Date  . Diabetes mellitus without complication (HCC)   . Hydrocephalus   . Kidney stones     Past Surgical History:  Procedure Laterality Date  . KIDNEY STONE SURGERY      Family History:  Family History  Problem Relation Age of Onset  . Breast cancer Mother     Social History:  reports that he has been smoking cigarettes.  He has been smoking about 1.00 pack per day. he has never used smokeless tobacco. He reports that he uses drugs. Drug: Marijuana. He reports that he does not drink alcohol.  Additional Social History:  Alcohol / Drug Use Pain Medications: see PTA meds Prescriptions: see PTA meds Over the Counter: see PTA meds History of alcohol / drug use?: Yes Longest period of sobriety (when/how long): unknown Negative Consequences of Use: Legal Withdrawal Symptoms: (Denies) Substance #1 Name of Substance 1: Cannabis 1 - Age of First Use: Unknown 1 - Amount (size/oz): Pt states amounts vary 1 - Frequency: Pt states three or more times a week 1 - Duration: Unknown pt states "  years" 1 - Last Use / Amount: Pt cannot recall pt is neg this date  CIWA: CIWA-Ar BP: 131/82 Pulse Rate: 69 COWS:    Allergies:  Allergies  Allergen Reactions  . Mushroom Extract Complex Hives, Itching and Nausea And Vomiting  . Penicillins Anaphylaxis, Hives and Swelling    Has patient had a PCN reaction causing immediate rash, facial/tongue/throat swelling, SOB or lightheadedness with hypotension: Yes Has patient had a PCN reaction  causing severe rash involving mucus membranes or skin necrosis: No Has patient had a PCN reaction that required hospitalization Yes Has patient had a PCN reaction occurring within the last 10 years: Yes If all of the above answers are "NO", then may proceed with Cephalosporin use.  . Sulfa Antibiotics Anaphylaxis and Hives  . Clindamycin/Lincomycin Hives    Home Medications:  (Not in a hospital admission)  OB/GYN Status:  No LMP for male patient.  General Assessment Data [facility] of Assessment: [facility] TTS Assessment: In system Is this a Tele or Face-to-Face Assessment?: Face-to-Face Is this an Initial Assessment or a Re-assessment for this encounter?: Initial Assessment Marital status: Single Maiden name: NA Is patient pregnant?: No Pregnancy Status: No Living Arrangements: Alone(Homeless) Can pt return to current living arrangement?: Yes Admission Status: Voluntary Is patient capable of signing voluntary admission?: Yes Referral Source: Self/Family/Friend Insurance type: Self Pay  Medical Screening Exam [facility] Walk-in ONLY) Medical Exam completed: Yes  Crisis Care Plan Living Arrangements: Alone(Homeless) Legal Guardian: (NA) Name of Psychiatrist: None Name of Therapist: None  Education Status Is patient currently in school?: No Current Grade: (NA) Highest grade of school patient has completed: (12) Name of school: (NA) Contact person: (NA)  Risk to self with the past 6 months Suicidal Ideation: Yes-Currently Present Has patient been a risk to self within the past 6 months prior to admission? : No Suicidal Intent: Yes-Currently Present Has patient had any suicidal intent within the past 6 months prior to admission? : No Is patient at risk for suicide?: Yes Suicidal Plan?: Yes-Currently Present Has patient had any suicidal plan within the past 6 months prior to admission? : No Specify Current Suicidal Plan: Overdose Access to Means: Yes Specify Access to Suicidal  Means: Pt states will overdose on Insulin What has been your use of drugs/alcohol within the last 12 months?: Current use Previous Attempts/Gestures: Yes How many times?: 4 Other Self Harm Risks: Unknown Triggers for Past Attempts: Unknown Intentional Self Injurious Behavior: None Family Suicide History: No Recent stressful life event(s): Other (Comment)(Legal, Homeless) Persecutory voices/beliefs?: No Depression: Yes Depression Symptoms: Feeling worthless/self pity Substance abuse history and/or treatment for substance abuse?: No Suicide prevention information given to non-admitted patients: Not applicable  Risk to Others within the past 6 months Homicidal Ideation: No Does patient have any lifetime risk of violence toward others beyond the six months prior to admission? : No Thoughts of Harm to Others: No Current Homicidal Intent: No Current Homicidal Plan: No Access to Homicidal Means: No Identified Victim: NA History of harm to others?: No Assessment of Violence: None Noted Violent Behavior Description: NA Does patient have access to weapons?: No Criminal Charges Pending?: Yes Describe Pending Criminal Charges: Possession Does patient have a court date: Yes Court Date: 01/03/18 Is patient on probation?: No  Psychosis Hallucinations: None noted Delusions: None noted  Mental Status Report Appearance/Hygiene: In scrubs Eye Contact: Poor Motor Activity: Freedom of movement Speech: Soft, Slow Level of Consciousness: Drowsy Mood: Depressed Affect: Flat Anxiety Level: Minimal Thought Processes: Coherent,  Relevant Judgement: Unimpaired Orientation: Person, Place, Time Obsessive Compulsive Thoughts/Behaviors: None  Cognitive Functioning Concentration: Decreased Memory: Recent Intact, Remote Intact IQ: Average Insight: Poor Impulse Control: Poor Appetite: Fair Weight Loss: 0 Weight Gain: 0 Sleep: No Change Total Hours of Sleep: 7 Vegetative Symptoms:  None  ADLScreening [facility] Assessment Services) Patient's cognitive ability adequate to safely complete daily activities?: Yes Patient able to express need for assistance with ADLs?: Yes Independently performs ADLs?: Yes (appropriate for developmental age)  Prior Inpatient Therapy Prior Inpatient Therapy: Yes Prior Therapy Dates: 2018,2017 Prior Therapy Facilty/Provider(s): [facility], [facility] Reason for Treatment: MH isssues  Prior Outpatient Therapy Prior Outpatient Therapy: No Prior Therapy Dates: NA Prior Therapy Facilty/Provider(s): NA Reason for Treatment: NA Does patient have an ACCT team?: No Does patient have Intensive In-House Services?  : No Does patient have [facility] services? : No Does patient have P4CC services?: No  ADL Screening (condition at time of admission) Patient's cognitive ability adequate to safely complete daily activities?: Yes Is the patient deaf or have difficulty hearing?: No Does the patient have difficulty seeing, even when wearing glasses/contacts?: No Does the patient have difficulty concentrating, remembering, or making decisions?: No Patient able to express need for assistance with ADLs?: Yes Does the patient have difficulty dressing or bathing?: No Independently performs ADLs?: Yes (appropriate for developmental age) Does the patient have difficulty walking or climbing stairs?: No Weakness of Legs: None Weakness of Arms/Hands: None  Home Assistive Devices/Equipment Home Assistive Devices/Equipment: None  Therapy Consults (therapy consults require a physician order) PT Evaluation Needed: No OT Evalulation Needed: No SLP Evaluation Needed: No Abuse/Neglect Assessment (Assessment to be complete while patient is alone) Physical Abuse: Denies Verbal Abuse: Denies Sexual Abuse: Denies Exploitation of patient/patient's resources: Denies Self-Neglect: Denies Values / Beliefs Cultural Requests During Hospitalization: None Spiritual Requests During  Hospitalization: None Consults Spiritual Care Consult Needed: No Social Work Consult Needed: No Merchant navy officer (For Healthcare) Does Patient Have a Medical Advance Directive?: No Would patient like information on creating a medical advance directive?: No - Patient declined    Additional Information 1:1 In Past 12 Months?: No CIRT Risk: No Elopement Risk: No Does patient have medical clearance?: Yes     Disposition: Case was staffed with Shaune Pollack DNP who recommended patient be monitored and observed for safety. Patient will be seen by psychiatry in the a.m.   Disposition Initial Assessment Completed for this Encounter: Yes Disposition of Patient: Other dispositions Other disposition(s): Other (Comment)(Pt will be monitored and observed)  On Site Evaluation by:   Reviewed with Physician:    Alfredia Ferguson 12/06/2017 6:31 PM

## 2017-12-06 NOTE — ED Notes (Signed)
Pt eating dinner

## 2017-12-06 NOTE — ED Notes (Signed)
Pt eating sandwich, cheese stick, orange juice, and diet coke.

## 2017-12-06 NOTE — ED Notes (Signed)
CBG 56 mg/dl, Pt asymptomatic. Orange juice provided. Will continue to monitor.

## 2017-12-06 NOTE — ED Notes (Signed)
Pt cbg 54 mg/dl, OJ provided, pt asymptomatic, will continue to monitor.

## 2017-12-06 NOTE — ED Notes (Signed)
Pt has 3 bags of belongings by 1-8 nurses' station.

## 2017-12-06 NOTE — ED Notes (Signed)
Bed: [facility] Expected date:  Expected time:  Means of arrival:  Comments: 

## 2017-12-06 NOTE — ED Notes (Signed)
SBAR Report received from previous nurse. Pt received calm and visible on unit. Pt denies current HI, A/V H, but endorse pain 6/10, anxiety 7/10, and depression 9/10  at this time, but appears otherwise stable and free of distress. Pt reminded of camera surveillance, q 15 min rounds, and rules of the milieu. Will continue to assess.

## 2017-12-06 NOTE — ED Provider Notes (Signed)
[facility] DEPT Provider Note   CSN: [id number] Arrival date & time: 12/06/17  1322     History   Chief Complaint Chief Complaint  Patient presents with  . Suicidal    HPI Mark [name] is a 32 y.o. male.  HPI   32 year old male with known history of malingering, who has been seen at this hospital and multiple local EDs with suicidal ideation and hyperglycemia.  He is homeless living at  homeless shelter.  He is here with suicidal ideation.  He was discharged from a local ED earlier today for evaluation of hyperglycemia and malingering.  This is his fourth ER visit within this week.  Patient report he has been feeling suicidal ongoing for the past week.  He states he has no desire to live.  He tries to harm himself by cutting his left wrist a week ago.  He admits he has been seen at an outside hospital for his symptoms but states that "they did not do anything for me".  He is here requesting help with his suicidal ideation.  He admits that he is homeless and does not know his way around.  He did admit receiving prescription for his medication but unable to fill it.  He denies having homicidal ideation, auditory or visual hallucination.  Patient states he have not been taking his psychiatric medication for more than a month.  He is eating more, and sleeping less.  Report using marijuana 3 weeks ago but no recent alcohol use or other drug use.  Past Medical History:  Diagnosis Date  . Diabetes mellitus without complication ([place])   . Hydrocephalus   . Kidney stones     Patient Active Problem List   Diagnosis Date Noted  . Noncompliance 10/17/2017  . Dysthymia 10/08/2017  . Personality disorder ([place]) 09/26/2017  . 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 Obstructive hydrocephalus 09/19/2017  . Diabetes ([place]) 09/11/2017  . Hypoglycemia 09/09/2017  . MDD (major depressive disorder) 08/22/2017  . Severe recurrent major depression with psychotic features ([place])  08/19/2017  . Nausea & vomiting 08/18/2017  . Abdominal pain 08/18/2017  . Hyponatremia 08/18/2017  . DKA (diabetic ketoacidoses) ([place]) 11/24/2015  . Diabetic keto-acidosis ([place]) 10/23/2015    Past Surgical History:  Procedure Laterality Date  . KIDNEY STONE SURGERY         Home Medications    Prior to Admission medications   Medication Sig Start Date End Date Taking? Authorizing Provider  blood glucose meter kit and supplies KIT Dispense based on patient and insurance preference. Use up to four times daily as directed. (FOR ICD-9 250.00, 250.01). 10/08/17   Harvest Dark, MD  gabapentin (NEURONTIN) 400 MG capsule Take 3 capsules (1,200 mg total) by mouth 3 (three) times daily. 09/17/17   Pucilowska, Herma Ard B, MD  haloperidol decanoate (HALDOL DECANOATE) 100 MG/ML injection Inject 1 mL (100 mg total) into the muscle every 28 (twenty-eight) days. 09/25/17   Pucilowska, Herma Ard B, MD  hydrOXYzine (ATARAX/VISTARIL) 25 MG tablet Take 1 tablet by mouth daily. 08/21/16   [provider]  insulin aspart (NOVOLOG) 100 UNIT/ML injection Inject 6 Units into the skin 3 (three) times daily with meals. 10/08/17   Harvest Dark, MD  insulin glargine (LANTUS) 100 UNIT/ML injection Inject 0.38 mLs (38 Units total) into the skin every morning. 10/08/17 11/07/17  Harvest Dark, MD  mirtazapine (REMERON) 30 MG tablet Take 30 mg by mouth at bedtime.    [provider]  traZODone (DESYREL) 100  MG tablet Take 1 tablet (100 mg total) by mouth at bedtime as needed for sleep. 09/17/17   Pucilowska, Herma Ard B, MD  venlafaxine XR (EFFEXOR-XR) 150 MG 24 hr capsule Take 2 capsules (300 mg total) by mouth daily with breakfast. 09/26/17   Pucilowska, Jolanta B, MD  zolpidem (AMBIEN) 10 MG tablet Take 10 mg by mouth at bedtime as needed for sleep.    [provider]    Family History Family History  Problem Relation Age of Onset  . Breast cancer Mother     Social  History Social History   Tobacco Use  . Smoking status: Current Every Day Smoker    Packs/day: 1.00    Types: Cigarettes  . Smokeless tobacco: Never Used  Substance Use Topics  . Alcohol use: No    Comment: ocasional   . Drug use: Yes    Types: Marijuana    Comment: "as often as I can"     Allergies   Penicillins; Sulfa antibiotics; and Clindamycin/lincomycin   Review of Systems Review of Systems  All other systems reviewed and are negative.    Physical Exam Updated Vital Signs BP 116/70 (BP Location: Right Arm)   Pulse 88   Temp 98.7 F (37.1 C) (Oral)   Resp 18   Ht 6' 2"  (1.88 m)   Wt 99.8 kg (220 lb)   SpO2 98%   BMI 28.25 kg/m   Physical Exam  Constitutional:  Disheveled appearance no acute distress  HENT:  Premature balding, poor dentition  Cardiovascular: Normal rate and regular rhythm.  Pulmonary/Chest: Effort normal and breath sounds normal.  Abdominal: Soft. There is no tenderness.  Neurological: He is alert.  Psychiatric: His speech is normal. His affect is blunt. He is agitated. Thought content is not paranoid. He expresses impulsivity. He expresses suicidal ideation. He expresses no homicidal ideation.  Nursing note and vitals reviewed.    ED Treatments / Results  Labs (all labs ordered are listed, but only abnormal results are displayed) Labs Reviewed  ACETAMINOPHEN LEVEL - Abnormal; Notable for the following components:      Result Value   Acetaminophen (Tylenol), Serum <10 (*)    All other components within normal limits  CBC - Abnormal; Notable for the following components:   RBC 4.05 (*)    Hemoglobin 12.9 (*)    HCT 37.0 (*)    All other components within normal limits  COMPREHENSIVE METABOLIC PANEL  ETHANOL  SALICYLATE LEVEL  RAPID URINE DRUG SCREEN, HOSP PERFORMED    EKG  EKG Interpretation None       Radiology No results found.  Procedures Procedures (including critical care time)  Medications Ordered in  ED Medications  acetaminophen (TYLENOL) tablet 650 mg (not administered)  zolpidem (AMBIEN) tablet 5 mg (not administered)  ondansetron (ZOFRAN) tablet 4 mg (not administered)  alum & mag hydroxide-simeth (MAALOX/MYLANTA) 200-200-20 MG/5ML suspension 30 mL (not administered)  nicotine (NICODERM CQ - dosed in mg/24 hours) patch 21 mg (not administered)     Initial Impression / Assessment and Plan / ED Course  I have reviewed the triage vital signs and the nursing notes.  Pertinent labs & imaging results that were available during my care of the patient were reviewed by me and considered in my medical decision making (see chart for details).     BP 116/70 (BP Location: Right Arm)   Pulse 88   Temp 98.7 F (37.1 C) (Oral)   Resp 18   Ht 6' 2"  (  1.88 m)   Wt 99.8 kg (220 lb)   SpO2 98%   BMI 28.25 kg/m    Final Clinical Impressions(s) / ED Diagnoses   Final diagnoses:  Suicidal ideation    ED Discharge Orders    None     2:17 PM Patient with history of uncontrolled diabetes, malingering, here with suicidal ideation without any specific pain.  Obtained report trying to cut his wrist a week ago.  He has a superficial skin tear noted to the left wrist without signs of infection.  Upon review of prior notes, patient has been seen at an outside ED several times within the past week, most recent was this morning.  At this time, will consult TTS for further management.  3:37 PM Patient is medically cleared.  TTS will evaluate patient and will determine disposition.   Domenic Moras, PA-C 12/06/17 1537    Valarie Merino, MD 12/06/17 [phone]

## 2017-12-06 NOTE — BH Assessment (Signed)
[facility] Assessment Progress Note Case was staffed with Lord DNP who recommended patient be monitored and observed for safety. Patient will be seen by psychiatry in the a.m.        

## 2017-12-06 NOTE — ED Triage Notes (Signed)
Per EMS pt complaint of SI without plan for a week "just tired of life;" pt DMI; took insulin but has not eaten. Has not taken psych medication for a month.

## 2017-12-06 NOTE — ED Notes (Signed)
[id number] Expected date:  Expected time:  Means of arrival:  Comments: Margo AyeHall A

## 2017-12-06 NOTE — ED Notes (Signed)
Pt admitted to room #43. Pt reports he is currently homeless. Pt presents with sad affect. Endorsing SI. Denies HI/AVH. Pt identifies "life" as stressor. Encouragement and support provided. Special checks q 15 mins in place for safety, video monitoring in place. Will continue to monitor.

## 2017-12-07 ENCOUNTER — Encounter (HOSPITAL_COMMUNITY): Payer: Self-pay | Admitting: Emergency Medicine

## 2017-12-07 ENCOUNTER — Emergency Department (HOSPITAL_COMMUNITY)
Admission: EM | Admit: 2017-12-07 | Discharge: 2017-12-07 | Disposition: A | Discharge status: Home / Self Care | Payer: Self-pay | Attending: Emergency Medicine | Admitting: Emergency Medicine

## 2017-12-07 ENCOUNTER — Emergency Department (HOSPITAL_COMMUNITY): Payer: Self-pay

## 2017-12-07 ENCOUNTER — Other Ambulatory Visit: Payer: Self-pay

## 2017-12-07 ENCOUNTER — Emergency Department (HOSPITAL_COMMUNITY)
Admission: EM | Admit: 2017-12-07 | Discharge: 2017-12-23 | Disposition: A | Discharge status: Home / Self Care | Payer: Self-pay | Attending: Emergency Medicine | Admitting: Emergency Medicine

## 2017-12-07 ENCOUNTER — Encounter (HOSPITAL_COMMUNITY): Payer: Self-pay | Admitting: *Deleted

## 2017-12-07 DIAGNOSIS — Y93E1 Activity, personal bathing and showering: Secondary | ICD-10-CM | POA: Insufficient documentation

## 2017-12-07 DIAGNOSIS — Z79899 Other long term (current) drug therapy: Secondary | ICD-10-CM | POA: Insufficient documentation

## 2017-12-07 DIAGNOSIS — F4321 Adjustment disorder with depressed mood: Secondary | ICD-10-CM | POA: Diagnosis present

## 2017-12-07 DIAGNOSIS — G911 Obstructive hydrocephalus: Secondary | ICD-10-CM | POA: Insufficient documentation

## 2017-12-07 DIAGNOSIS — R450 Nervousness: Secondary | ICD-10-CM

## 2017-12-07 DIAGNOSIS — Z794 Long term (current) use of insulin: Secondary | ICD-10-CM | POA: Insufficient documentation

## 2017-12-07 DIAGNOSIS — F17210 Nicotine dependence, cigarettes, uncomplicated: Secondary | ICD-10-CM

## 2017-12-07 DIAGNOSIS — W01198A Fall on same level from slipping, tripping and stumbling with subsequent striking against other object, initial encounter: Secondary | ICD-10-CM | POA: Insufficient documentation

## 2017-12-07 DIAGNOSIS — Y9389 Activity, other specified: Secondary | ICD-10-CM | POA: Insufficient documentation

## 2017-12-07 DIAGNOSIS — Y999 Unspecified external cause status: Secondary | ICD-10-CM | POA: Insufficient documentation

## 2017-12-07 DIAGNOSIS — Y92239 Unspecified place in hospital as the place of occurrence of the external cause: Secondary | ICD-10-CM | POA: Insufficient documentation

## 2017-12-07 DIAGNOSIS — F1290 Cannabis use, unspecified, uncomplicated: Secondary | ICD-10-CM

## 2017-12-07 DIAGNOSIS — Q030 Malformations of aqueduct of Sylvius: Secondary | ICD-10-CM | POA: Insufficient documentation

## 2017-12-07 DIAGNOSIS — E119 Type 2 diabetes mellitus without complications: Secondary | ICD-10-CM | POA: Insufficient documentation

## 2017-12-07 DIAGNOSIS — W19XXXA Unspecified fall, initial encounter: Secondary | ICD-10-CM

## 2017-12-07 DIAGNOSIS — Q039 Congenital hydrocephalus, unspecified: Secondary | ICD-10-CM

## 2017-12-07 DIAGNOSIS — R45851 Suicidal ideations: Secondary | ICD-10-CM | POA: Insufficient documentation

## 2017-12-07 DIAGNOSIS — F419 Anxiety disorder, unspecified: Secondary | ICD-10-CM

## 2017-12-07 DIAGNOSIS — F314 Bipolar disorder, current episode depressed, severe, without psychotic features: Secondary | ICD-10-CM | POA: Diagnosis present

## 2017-12-07 DIAGNOSIS — F329 Major depressive disorder, single episode, unspecified: Secondary | ICD-10-CM | POA: Insufficient documentation

## 2017-12-07 DIAGNOSIS — R4587 Impulsiveness: Secondary | ICD-10-CM

## 2017-12-07 DIAGNOSIS — Y929 Unspecified place or not applicable: Secondary | ICD-10-CM | POA: Insufficient documentation

## 2017-12-07 DIAGNOSIS — Y998 Other external cause status: Secondary | ICD-10-CM | POA: Insufficient documentation

## 2017-12-07 DIAGNOSIS — S0990XA Unspecified injury of head, initial encounter: Secondary | ICD-10-CM | POA: Insufficient documentation

## 2017-12-07 LAB — CBC
HEMATOCRIT: 39.4 % (ref 39.0–52.0)
Hemoglobin: 13.4 g/dL (ref 13.0–17.0)
MCH: 31.3 pg (ref 26.0–34.0)
MCHC: 34 g/dL (ref 30.0–36.0)
MCV: 92.1 fL (ref 78.0–100.0)
PLATELETS: 417 10*3/uL — AB (ref 150–400)
RBC: 4.28 MIL/uL (ref 4.22–5.81)
RDW: 11.9 % (ref 11.5–15.5)
WBC: 10.1 10*3/uL (ref 4.0–10.5)

## 2017-12-07 LAB — COMPREHENSIVE METABOLIC PANEL
ALK PHOS: 95 U/L (ref 38–126)
ALT: 16 U/L — ABNORMAL LOW (ref 17–63)
AST: 25 U/L (ref 15–41)
Albumin: 4.1 g/dL (ref 3.5–5.0)
Anion gap: 12 (ref 5–15)
BUN: 11 mg/dL (ref 6–20)
CALCIUM: 9.4 mg/dL (ref 8.9–10.3)
CO2: 23 mmol/L (ref 22–32)
Chloride: 102 mmol/L (ref 101–111)
Creatinine, Ser: 1.02 mg/dL (ref 0.61–1.24)
GLUCOSE: 304 mg/dL — AB (ref 65–99)
POTASSIUM: 3.7 mmol/L (ref 3.5–5.1)
Sodium: 137 mmol/L (ref 135–145)
TOTAL PROTEIN: 7.6 g/dL (ref 6.5–8.1)
Total Bilirubin: 0.7 mg/dL (ref 0.3–1.2)

## 2017-12-07 LAB — RAPID URINE DRUG SCREEN, HOSP PERFORMED
Amphetamines: NOT DETECTED
Barbiturates: NOT DETECTED
Benzodiazepines: NOT DETECTED
COCAINE: NOT DETECTED
Opiates: NOT DETECTED
Tetrahydrocannabinol: NOT DETECTED

## 2017-12-07 LAB — ETHANOL

## 2017-12-07 LAB — ACETAMINOPHEN LEVEL

## 2017-12-07 LAB — SALICYLATE LEVEL

## 2017-12-07 LAB — CBG MONITORING, ED
Glucose-Capillary: 487 mg/dL — ABNORMAL HIGH (ref 65–99)
Glucose-Capillary: 330 mg/dL — ABNORMAL HIGH (ref 65–99)

## 2017-12-07 MED ORDER — ZOLPIDEM TARTRATE 5 MG PO TABS: 5.0000 mg | ORAL_TABLET | Freq: Every evening | ORAL | Status: DC | PRN

## 2017-12-07 MED ORDER — GABAPENTIN 300 MG PO CAPS
300.0000 mg | ORAL_CAPSULE | Freq: Two times a day (BID) | ORAL | Status: DC
  Filled 2017-12-07: qty 1

## 2017-12-07 NOTE — ED Provider Notes (Signed)
[facility] DEPARTMENT Provider Note   CSN: [id number] Arrival date & time: 12/07/17  1610     History   Chief Complaint Chief Complaint  Patient presents with  . Fall    HPI Mark [name] is a 32 y.o. male who presents with a head injury. PMH significant for hx if bipolar d/o, malingering, homelessness, aggressive behavior, IDDM with non-compliance, and hx of hydrocephalus. He states that he was discharged from [facility] this morning and was sent to [facility] however they do not have any beds. He states he stood up from a chair and tripped and fell backwards and hit his head on a window ledge. The top of his head hurts and he feels dizzy. Additionally he states he had "fluid taken off his brain" 1-2 weeks ago. He thinks it was done at [facility] but is not sure because his brain is "foggy". He states he plans to go to [facility] because there are beds at a shelter there. No LOC. He is not on blood thinners and reports current compliance with his insulin.  HPI  Past Medical History:  Diagnosis Date  . Diabetes mellitus without complication ([place])   . Hydrocephalus   . Kidney stones     Patient Active Problem List   Diagnosis Date Noted  . Adjustment disorder with depressed mood 12/07/2017  . Noncompliance 10/17/2017  . Dysthymia 10/08/2017  . Personality disorder ([place]) 09/26/2017  . 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 Obstructive hydrocephalus 09/19/2017  . Diabetes ([place]) 09/11/2017  . Hypoglycemia 09/09/2017  . MDD (major depressive disorder) 08/22/2017  . Severe recurrent major depression with psychotic features ([place]) 08/19/2017  . Nausea & vomiting 08/18/2017  . Abdominal pain 08/18/2017  . Hyponatremia 08/18/2017  . DKA (diabetic ketoacidoses) ([place]) 11/24/2015  . Diabetic keto-acidosis ([place]) 10/23/2015    Past Surgical History:  Procedure Laterality Date  . KIDNEY STONE SURGERY         Home Medications    Prior to Admission  medications   Medication Sig Start Date End Date Taking? Authorizing Provider  blood glucose meter kit and supplies KIT Dispense based on patient and insurance preference. Use up to four times daily as directed. (FOR ICD-9 250.00, 250.01). Patient not taking: Reported on 12/06/2017 10/08/17   Harvest Dark, MD  gabapentin (NEURONTIN) 400 MG capsule Take 3 capsules (1,200 mg total) by mouth 3 (three) times daily. Patient not taking: Reported on 12/06/2017 09/17/17   Pucilowska, Herma Ard B, MD  haloperidol decanoate (HALDOL DECANOATE) 100 MG/ML injection Inject 1 mL (100 mg total) into the muscle every 28 (twenty-eight) days. Patient not taking: Reported on 12/06/2017 09/25/17   Pucilowska, Herma Ard B, MD  insulin aspart (NOVOLOG) 100 UNIT/ML FlexPen Inject 12 Units into the skin 3 (three) times daily before meals. 11/08/16   [provider]  insulin glargine (LANTUS) 100 UNIT/ML injection Inject 0.38 mLs (38 Units total) into the skin every morning. 10/08/17 12/06/17  Harvest Dark, MD  traZODone (DESYREL) 100 MG tablet Take 1 tablet (100 mg total) by mouth at bedtime as needed for sleep. Patient not taking: Reported on 12/06/2017 09/17/17   Clovis Fredrickson, MD  venlafaxine XR (EFFEXOR-XR) 150 MG 24 hr capsule Take 2 capsules (300 mg total) by mouth daily with breakfast. Patient not taking: Reported on 12/06/2017 09/26/17   Clovis Fredrickson, MD    Family History Family History  Problem Relation Age of Onset  . Breast cancer Mother  Social History Social History   Tobacco Use  . Smoking status: Current Every Day Smoker    Packs/day: 1.00    Types: Cigarettes  . Smokeless tobacco: Never Used  Substance Use Topics  . Alcohol use: No    Comment: ocasional   . Drug use: Yes    Types: Marijuana    Comment: "as often as I can"     Allergies   Mushroom extract complex; Penicillins; Sulfa antibiotics; and Clindamycin/lincomycin   Review of Systems Review of  Systems  Musculoskeletal: Positive for gait problem. Negative for neck pain.  Skin: Negative for wound.  Neurological: Positive for dizziness and headaches. Negative for weakness.     Physical Exam Updated Vital Signs BP 123/81   Pulse 86   Temp 98.1 F (36.7 C) (Oral)   Resp 16   SpO2 97%   Physical Exam  Constitutional: He is oriented to person, place, and time. He appears well-developed and well-nourished. No distress.  HENT:  Head: Normocephalic and atraumatic.  Right Ear: Hearing, tympanic membrane, external ear and ear canal normal.  No obvious signs of trauma  Eyes: Conjunctivae are normal. Pupils are equal, round, and reactive to light. Right eye exhibits no discharge. Left eye exhibits no discharge. No scleral icterus.  Neck: Normal range of motion.  Cardiovascular: Normal rate.  Pulmonary/Chest: Effort normal. No respiratory distress.  Abdominal: He exhibits no distension.  Neurological: He is alert and oriented to person, place, and time.  Lying on stretcher in NAD. GCS 15. Speaks in a clear voice. Cranial nerves II through XII grossly intact. 5/5 strength in all extremities. Sensation fully intact.  Bilateral finger-to-nose intact. Ambulatory    Skin: Skin is warm and dry.  Psychiatric: He has a normal mood and affect. His behavior is normal.  Nursing note and vitals reviewed.    ED Treatments / Results  Labs (all labs ordered are listed, but only abnormal results are displayed) Labs Reviewed - No data to display  EKG  EKG Interpretation None       Radiology Ct Head Wo Contrast  Result Date: 12/07/2017 CLINICAL DATA:  Pain after fall. EXAM: CT HEAD WITHOUT CONTRAST TECHNIQUE: Contiguous axial images were obtained from the base of the skull through the vertex without intravenous contrast. COMPARISON:  September 19, 2017. FINDINGS: Brain: No subdural, epidural, or subarachnoid hemorrhage. Severely dilated lateral and third ventricles, not significantly  changed. The third ventricle is dilated. The fourth ventricle is normal. 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 No acute cortical ischemia or infarct. No midline shift. Cerebellum, brainstem, and basal cisterns are stable. Vascular: No hyperdense vessel or unexpected calcification. Skull: Normal. Negative for fracture or focal lesion. Sinuses/Orbits: Mucosal thickening in the left maxillary sinus. Paranasal sinuses, middle ears, and mastoid air cells otherwise normal. Other: None. IMPRESSION: 1. No posttraumatic changes intracranially. 2. 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 The degree of dilatation is unchanged since November of 2018 and November 2013. I suspect the patient has already had previous neuro surgical consultation. If not, such consultation is recommended. 3. No other acute abnormalities. Electronically Signed   By: Dorise Bullion III M.D   On: 12/07/2017 18:58    Procedures Procedures (including critical care time)  Medications Ordered in ED Medications - No data to display   Initial Impression / Assessment and Plan / ED Course  I have reviewed the triage vital signs and the nursing notes.  Pertinent labs & imaging  results that were available during my care of the patient were reviewed by me and considered in my medical decision making (see chart for details).  32 year old male with reported head injury.  He claims he had fluid drawn off his brain 1-2 weeks ago at [facility].  I cannot see any record of this and suspect he is malingering because he does not have a ride to [place].  He is trying to get to a shelter there because they have beds.  His vital signs are normal.  His neuro exam is unremarkable.  Due to his unreliability, a CT of his head was obtained which shows stable hydrocephalus.  He was given a follow-up with neurosurgery.  Return precautions were given.  Final Clinical Impressions(s) / ED  Diagnoses   Final diagnoses:  Fall, initial encounter  Hydrocephalus associated with congenital aqueduct stenosis [facility])    ED Discharge Orders    None       Recardo Evangelist, PA-C 12/07/17 1934    Gareth Morgan, MD 12/08/17 2108

## 2017-12-07 NOTE — ED Notes (Signed)
Security wanded Pt.  

## 2017-12-07 NOTE — [facility] Suicide Risk Assessment (Signed)
Suicide Risk Assessment  Discharge Assessment   [facility]   Principal Problem: Adjustment disorder with depressed mood Discharge Diagnoses:  Patient Active Problem List   Diagnosis Date Noted  . Adjustment disorder with depressed mood [F43.21] 12/07/2017  . Noncompliance [Z91.19] 10/17/2017  . Dysthymia [F34.1] 10/08/2017  . Personality disorder (HCC) [F60.9] 09/26/2017  . 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 Obstructive hydrocephalus [G91.1] 09/19/2017  . Diabetes (HCC) [E11.9] 09/11/2017  . Hypoglycemia [E16.2] 09/09/2017  . MDD (major depressive disorder) [F32.9] 08/22/2017  . Severe recurrent major depression with psychotic features (HCC) [F33.3] 08/19/2017  . Nausea & vomiting [R11.2] 08/18/2017  . Abdominal pain [R10.9] 08/18/2017  . Hyponatremia [E87.1] 08/18/2017  . DKA (diabetic ketoacidoses) (HCC) [E13.10] 11/24/2015  . Diabetic keto-acidosis (HCC) [E13.10] 10/23/2015    Total Time spent with patient: 45 minutes  Musculoskeletal: Strength & Muscle Tone: within normal limits Gait & Station: normal Patient leans: N/A  Psychiatric Specialty Exam: Physical Exam  Constitutional: He is oriented to person, place, and time. He appears well-developed and well-nourished.  HENT:  Head: Normocephalic.  Respiratory: Effort normal.  Musculoskeletal: Normal range of motion.  Neurological: He is alert and oriented to person, place, and time.  Psychiatric: His speech is normal. Thought content normal. His mood appears anxious. His affect is angry. He is agitated. Cognition and memory are normal. He expresses impulsivity. He exhibits a depressed mood.   Review of Systems  Psychiatric/Behavioral: Positive for depression. Negative for hallucinations, memory loss, substance abuse and suicidal ideas. The patient is nervous/anxious. The patient does not have insomnia.   All other systems reviewed and are negative.  Blood pressure 110/67, pulse 77,  temperature 98.5 F (36.9 C), temperature source Oral, resp. rate 18, height 6\' 2"  (1.88 m), weight 99.8 kg (220 lb), SpO2 99 %.Body mass index is 28.25 kg/m. General Appearance: Disheveled Eye Contact:  Fair Speech:  Clear and Coherent Volume:  Normal Mood:  Angry, Anxious and Irritable Affect:  Congruent Thought Process:  Coherent and Goal Directed Orientation:  Full (Time, Place, and Person) Thought Content:  Illogical Suicidal Thoughts:  No Homicidal Thoughts:  No Memory:  Immediate;   Good Recent;   Good Remote;   Fair Judgement:  Poor Insight:  Lacking Psychomotor Activity:  Normal Concentration:  Concentration: Good and Attention Span: Good Recall:  Good Fund of Knowledge:  Good Language:  Good Akathisia:  No Handed:  Right AIMS (if indicated):    Assets:  Communication Skills ADL's:  Intact Cognition:  WNL   Mental Status Per Nursing Assessment::   On Admission:   Suicidal ideation  Demographic Factors:  Male, Caucasian, Low socioeconomic status and Unemployed  Loss Factors: Financial problems/change in socioeconomic status  Historical Factors: Impulsivity  Risk Reduction Factors:   Sense of responsibility to family  Continued Clinical Symptoms:  Severe Anxiety and/or Agitation Depression:   Impulsivity  Cognitive Features That Contribute To Risk:  Closed-mindedness    Suicide Risk:  Minimal: No identifiable suicidal ideation.  Patients presenting with no risk factors but with morbid ruminations; may be classified as minimal risk based on the severity of the depressive symptoms    Plan Of Care/Follow-up recommendations:  Activity:  as tolerated Diet:  Heart Healthy  Laveda AbbeLaurie Britton Toby Ayad, NP 12/07/2017, 11:05 AM

## 2017-12-07 NOTE — ED Triage Notes (Signed)
Pt arrived by [facility] for a fall backwards and hit back of his head. No loc. Has pain to his head. No distress noted at triage.
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
## 2017-12-07 NOTE — ED Notes (Signed)
This nurse notified EDP of CBG 487 mg/dl CBG. Instructed to give ordered insulin and recheck after breakfast. Will continue to monitor.
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
## 2017-12-07 NOTE — ED Notes (Signed)
Pt unable to void at this timel

## 2017-12-07 NOTE — ED Notes (Signed)
Pt found wandering in hall, when ask what he was doing, pt states he is looking for something to kill himself with

## 2017-12-07 NOTE — Discharge Instructions (Signed)
Please follow up with Neurosurgery Return if worsening

## 2017-12-07 NOTE — ED Notes (Signed)
Pt d/c home per MD order. Pt verbally aggressive, threatening, [employer] present. Pt refusing VS. Pt uninterested in discharge summary. Homeless shelter resources provided. Hostile, "I don't want that shit." Bus pass provided per pt request. Personal property returned. Pt ambulatory off unit with [employer].

## 2017-12-07 NOTE — ED Notes (Signed)
Dr. Dalene SeltzerSchlossman aware of patient and possible need for IVC

## 2017-12-08 ENCOUNTER — Encounter (HOSPITAL_COMMUNITY): Payer: Self-pay | Admitting: Behavioral Health

## 2017-12-08 LAB — CBG MONITORING, ED
GLUCOSE-CAPILLARY: 273 mg/dL — AB (ref 65–99)
GLUCOSE-CAPILLARY: 406 mg/dL — AB (ref 65–99)
GLUCOSE-CAPILLARY: 423 mg/dL — AB (ref 65–99)
Glucose-Capillary: 84 mg/dL (ref 65–99)
Glucose-Capillary: 364 mg/dL — ABNORMAL HIGH (ref 65–99)
Glucose-Capillary: 125 mg/dL — ABNORMAL HIGH (ref 65–99)

## 2017-12-08 MED ORDER — INSULIN ASPART 100 UNIT/ML ~~LOC~~ SOLN
12.0000 [IU] | Freq: Three times a day (TID) | SUBCUTANEOUS | Status: DC
  Filled 2017-12-08: qty 1

## 2017-12-08 MED ORDER — GABAPENTIN 300 MG PO CAPS
1200.0000 mg | ORAL_CAPSULE | Freq: Three times a day (TID) | ORAL | Status: DC
  Administered 2017-12-08 – 2017-12-23 (×44): 1200 mg via ORAL
  Filled 2017-12-08 (×17): qty 3
  Filled 2017-12-08: qty 4
  Filled 2017-12-08 (×12): qty 3
  Filled 2017-12-08: qty 4
  Filled 2017-12-08 (×12): qty 3

## 2017-12-08 MED ORDER — TRAZODONE HCL 100 MG PO TABS
100.0000 mg | ORAL_TABLET | Freq: Every evening | ORAL | Status: DC | PRN
  Administered 2017-12-08 – 2017-12-21 (×13): 100 mg via ORAL
  Filled 2017-12-08 (×14): qty 1

## 2017-12-08 MED ORDER — INSULIN DETEMIR 100 UNIT/ML ~~LOC~~ SOLN
38.0000 [IU] | Freq: Every day | SUBCUTANEOUS | Status: DC
  Administered 2017-12-08 – 2017-12-22 (×13): 38 [IU] via SUBCUTANEOUS
  Filled 2017-12-08 (×20): qty 0.38

## 2017-12-08 MED ORDER — ZIPRASIDONE MESYLATE 20 MG IM SOLR
20.0000 mg | Freq: Once | INTRAMUSCULAR | Status: AC
  Administered 2017-12-08: 20 mg via INTRAMUSCULAR
  Filled 2017-12-08: qty 20

## 2017-12-08 MED ORDER — INSULIN ASPART 100 UNIT/ML ~~LOC~~ SOLN
0.0000 [IU] | Freq: Three times a day (TID) | SUBCUTANEOUS | Status: DC
  Administered 2017-12-08: 15 [IU] via SUBCUTANEOUS
  Administered 2017-12-08: 2 [IU] via SUBCUTANEOUS
  Administered 2017-12-09: 3 [IU] via SUBCUTANEOUS
  Administered 2017-12-09: 11 [IU] via SUBCUTANEOUS
  Administered 2017-12-09: 3 [IU] via SUBCUTANEOUS
  Administered 2017-12-10: 5 [IU] via SUBCUTANEOUS
  Administered 2017-12-10 – 2017-12-11 (×2): 11 [IU] via SUBCUTANEOUS
  Administered 2017-12-11: 8 [IU] via SUBCUTANEOUS
  Administered 2017-12-12: 11 [IU] via SUBCUTANEOUS
  Administered 2017-12-12: 8 [IU] via SUBCUTANEOUS
  Administered 2017-12-13: 15 [IU] via SUBCUTANEOUS
  Administered 2017-12-13: 8 [IU] via SUBCUTANEOUS
  Administered 2017-12-14: 3 [IU] via SUBCUTANEOUS
  Administered 2017-12-15: 15 [IU] via SUBCUTANEOUS
  Administered 2017-12-16: 11 [IU] via SUBCUTANEOUS
  Administered 2017-12-17: 2 [IU] via SUBCUTANEOUS
  Administered 2017-12-17: 5 [IU] via SUBCUTANEOUS
  Administered 2017-12-17: 2 [IU] via SUBCUTANEOUS
  Administered 2017-12-18: 8 [IU] via SUBCUTANEOUS
  Administered 2017-12-18: 3 [IU] via SUBCUTANEOUS
  Administered 2017-12-18: 2 [IU] via SUBCUTANEOUS
  Administered 2017-12-19: 5 [IU] via SUBCUTANEOUS
  Administered 2017-12-19: 15 [IU] via SUBCUTANEOUS
  Administered 2017-12-20: 11 [IU] via SUBCUTANEOUS
  Administered 2017-12-20: 8 [IU] via SUBCUTANEOUS
  Administered 2017-12-21: 5 [IU] via SUBCUTANEOUS
  Administered 2017-12-21: 11 [IU] via SUBCUTANEOUS
  Administered 2017-12-22: 15 [IU] via SUBCUTANEOUS
  Administered 2017-12-22: 11 [IU] via SUBCUTANEOUS
  Administered 2017-12-23 (×2): 5 [IU] via SUBCUTANEOUS
  Filled 2017-12-08 (×33): qty 1

## 2017-12-08 MED ORDER — LORAZEPAM 2 MG/ML IJ SOLN
1.0000 mg | Freq: Once | INTRAMUSCULAR | Status: AC
  Administered 2017-12-08: 1 mg via INTRAVENOUS
  Filled 2017-12-08: qty 1

## 2017-12-08 MED ORDER — INSULIN ASPART 100 UNIT/ML ~~LOC~~ SOLN
0.0000 [IU] | Freq: Every day | SUBCUTANEOUS | Status: DC
  Administered 2017-12-08 – 2017-12-11 (×2): 5 [IU] via SUBCUTANEOUS
  Administered 2017-12-12: 3 [IU] via SUBCUTANEOUS
  Administered 2017-12-14: 4 [IU] via SUBCUTANEOUS
  Administered 2017-12-15 – 2017-12-16 (×2): 3 [IU] via SUBCUTANEOUS
  Administered 2017-12-17: 2 [IU] via SUBCUTANEOUS
  Administered 2017-12-18: 5 [IU] via SUBCUTANEOUS
  Administered 2017-12-19: 4 [IU] via SUBCUTANEOUS
  Administered 2017-12-20: 2 [IU] via SUBCUTANEOUS
  Administered 2017-12-21 – 2017-12-22 (×2): 4 [IU] via SUBCUTANEOUS
  Filled 2017-12-08 (×11): qty 1

## 2017-12-08 MED ORDER — INSULIN GLARGINE 100 UNIT/ML ~~LOC~~ SOLN: 35.0000 [IU] | Freq: Every day | SUBCUTANEOUS | Status: DC

## 2017-12-08 MED ORDER — VENLAFAXINE HCL ER 150 MG PO CP24
300.0000 mg | ORAL_CAPSULE | Freq: Every day | ORAL | Status: DC
  Administered 2017-12-08 – 2017-12-23 (×15): 300 mg via ORAL
  Filled 2017-12-08 (×17): qty 2

## 2017-12-08 MED ORDER — INSULIN GLARGINE 100 UNIT/ML ~~LOC~~ SOLN
38.0000 [IU] | Freq: Every day | SUBCUTANEOUS | Status: DC
  Filled 2017-12-08: qty 0.38

## 2017-12-08 MED ORDER — NICOTINE 21 MG/24HR TD PT24
21.0000 mg | MEDICATED_PATCH | Freq: Every day | TRANSDERMAL | Status: DC
  Administered 2017-12-08 – 2017-12-23 (×16): 21 mg via TRANSDERMAL
  Filled 2017-12-08 (×15): qty 1

## 2017-12-08 MED ORDER — INSULIN ASPART 100 UNIT/ML ~~LOC~~ SOLN
8.0000 [IU] | Freq: Once | SUBCUTANEOUS | Status: AC
  Administered 2017-12-08: 8 [IU] via SUBCUTANEOUS
  Filled 2017-12-08: qty 1

## 2017-12-08 NOTE — ED Notes (Signed)
Pt CBG 84, notified Becky Banker(RN).

## 2017-12-08 NOTE — ED Notes (Signed)
IVC papers - Off-Duty Officer corrected papers - Section B completed d/t was not completed initially. [facility], copy sent to Medical Records, original placed in folder for Magistrate, and all 3 sets on clipboard.

## 2017-12-08 NOTE — ED Notes (Signed)
Per Dr Eudelia Bunchardama, change Novolog 12 units TID to sliding scale. Requested for Pharmacy to adjust and to review pt's Lantus dosage d/t pt's CBG 84 this am.

## 2017-12-08 NOTE — ED Notes (Signed)
Pt asking staff "Do y'all think I'm faking that I'm having trouble walking?" Advised pt no - that we are concerned d/t his unsteady gait. States "This isn't new".

## 2017-12-08 NOTE — ED Notes (Signed)
Checked CBG 423 RN Kim informed

## 2017-12-08 NOTE — ED Notes (Addendum)
RN encouraged pt to ambulate to bathroom rather than using urinal - states "I'm unsteady on my feet". Asked pt if he has a chronic condition that would cause this - denies - states "I just get like this sometimes". Pt denies requiring any type of assistive device for ambulating. Pt noted to be irritable - states "I don't want you as my nurse". Pt then calmed after much discussion w/RN. Pt has on Fall Risk bracelet - Fall Risk sign placed on door.

## 2017-12-08 NOTE — ED Notes (Signed)
[name] aware [facility] need [facility].

## 2017-12-08 NOTE — ED Notes (Signed)
Pt out of shower and back in room.  Pt ambulating without any problems.

## 2017-12-08 NOTE — ED Provider Notes (Signed)
[facility] EMERGENCY DEPARTMENT Provider Note   CSN: [id number] Arrival date & time: 12/07/17  2054     History   Chief Complaint Chief Complaint  Patient presents with  . Suicidal    HPI Mark [name] is a 32 y.o. male.  HPI   32 year old male with a history of bipolar, cannabis use, malingering, diabetes, obstructive hydrocephalus, who had just been discharged from the emergency department after concern for fall, presents with concern for suicidal ideation.  Patient had been discharged, and had asked for taxi vouchers and was told that we were unable to provide these.  Reports that he was out trying to find the bus stop, and could not find it, and became frustrated, and felt he had no reason to live.  Reports he has severe suicidal ideation.  [employer] saw patient walking in front of a moving vehicle (he was not struck) and then they saw him picking up cans, and slitting his wrists with them.  Patient reports that he wants to kill himself.  Reports that he has been off of his medications for the last month and a half.  Reports that he now has severe suicidal ideation, and that if he leaves, he will kill himself.  Reports that he recently been discharged for similar things.  Reports that eventually he will be discharged out to the world and he says he will kill himself then and "the blood will be on all of your hands."    Patient has been evaluated at other facilities, which note prior histories of suspected malingering, had reported previously that he had SI and then recanted saying he was lying. He has a history of medication noncompliance and dissatisfaction of living at a shelter and has been suspected of malingering in the past due to secondary gain of housing.  Patient did not report SI at ED visit prior to his discharge, until he was just seen by [employer] reporting SI and slitting his wrists.  Reports also that he plans to commit suicide by not taking his insulin. Reports he  does not want to live.   Past Medical History:  Diagnosis Date  . Diabetes mellitus without complication ([place])   . Hydrocephalus   . Kidney stones     Patient Active Problem List   Diagnosis Date Noted  . Adjustment disorder with depressed mood 12/07/2017  . Noncompliance 10/17/2017  . Dysthymia 10/08/2017  . Personality disorder ([place]) 09/26/2017  . 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 Obstructive hydrocephalus 09/19/2017  . Diabetes ([place]) 09/11/2017  . Hypoglycemia 09/09/2017  . MDD (major depressive disorder) 08/22/2017  . Severe recurrent major depression with psychotic features ([place]) 08/19/2017  . Nausea & vomiting 08/18/2017  . Abdominal pain 08/18/2017  . Hyponatremia 08/18/2017  . DKA (diabetic ketoacidoses) ([place]) 11/24/2015  . Diabetic keto-acidosis ([place]) 10/23/2015    Past Surgical History:  Procedure Laterality Date  . KIDNEY STONE SURGERY         Home Medications    Prior to Admission medications   Medication Sig Start Date End Date Taking? Authorizing Provider  blood glucose meter kit and supplies KIT Dispense based on patient and insurance preference. Use up to four times daily as directed. (FOR ICD-9 250.00, 250.01). Patient not taking: Reported on 12/06/2017 10/08/17   Harvest Dark, MD  gabapentin (NEURONTIN) 400 MG capsule Take 3 capsules (1,200 mg total) by mouth 3 (three) times daily. Patient not taking: Reported on 12/06/2017 09/17/17   Clovis Fredrickson, MD  haloperidol decanoate (HALDOL DECANOATE) 100 MG/ML injection Inject 1 mL (100 mg total) into the muscle every 28 (twenty-eight) days. Patient not taking: Reported on 12/06/2017 09/25/17   Pucilowska, Herma Ard B, MD  insulin aspart (NOVOLOG) 100 UNIT/ML FlexPen Inject 12 Units into the skin 3 (three) times daily before meals. 11/08/16   [provider]  insulin glargine (LANTUS) 100 UNIT/ML injection Inject 0.38 mLs (38 Units total) into the skin every morning. 10/08/17 12/06/17  Harvest Dark, MD  traZODone (DESYREL) 100 MG tablet Take 1 tablet (100 mg total) by mouth at bedtime as needed for sleep. Patient not taking: Reported on 12/06/2017 09/17/17   Clovis Fredrickson, MD  venlafaxine XR (EFFEXOR-XR) 150 MG 24 hr capsule Take 2 capsules (300 mg total) by mouth daily with breakfast. Patient not taking: Reported on 12/06/2017 09/26/17   Clovis Fredrickson, MD    Family History Family History  Problem Relation Age of Onset  . Breast cancer Mother     Social History Social History   Tobacco Use  . Smoking status: Current Every Day Smoker    Packs/day: 1.00    Types: Cigarettes  . Smokeless tobacco: Never Used  Substance Use Topics  . Alcohol use: No    Comment: ocasional   . Drug use: Yes    Types: Marijuana    Comment: "as often as I can"     Allergies   Mushroom extract complex; Penicillins; Sulfa antibiotics; and Clindamycin/lincomycin   Review of Systems Review of Systems  Constitutional: Negative for fever.  HENT: Negative for sore throat.   Eyes: Negative for visual disturbance.  Respiratory: Negative for shortness of breath.   Cardiovascular: Negative for chest pain.  Gastrointestinal: Negative for abdominal pain.  Genitourinary: Negative for difficulty urinating.  Musculoskeletal: Negative for back pain and neck stiffness.  Skin: Negative for rash.  Neurological: Negative for syncope and headaches.  Psychiatric/Behavioral: Positive for self-injury and suicidal ideas.     Physical Exam Updated Vital Signs BP 132/88 (BP Location: Right Arm)   Pulse (!) 107   Temp 98.9 F (37.2 C) (Oral)   Resp 20   SpO2 98%   Physical Exam  Constitutional: He is oriented to person, place, and time. He appears well-developed and well-nourished. No distress.  HENT:  Head: Normocephalic and atraumatic.  Eyes: Conjunctivae and EOM are normal.  Neck: Normal range of motion.  Cardiovascular: Normal rate, regular rhythm, normal heart sounds and  intact distal pulses. Exam reveals no gallop and no friction rub.  No murmur heard. Pulmonary/Chest: Effort normal and breath sounds normal. No respiratory distress. He has no wheezes. He has no rales.  Abdominal: Soft. He exhibits no distension. There is no tenderness. There is no guarding.  Musculoskeletal: He exhibits no edema.  Neurological: He is alert and oriented to person, place, and time.  Skin: Skin is warm and dry. He is not diaphoretic.  Superficial abrasion to left wrist  Psychiatric: He is agitated. He expresses suicidal ideation. He expresses suicidal plans.  Nursing note and vitals reviewed.    ED Treatments / Results  Labs (all labs ordered are listed, but only abnormal results are displayed) Labs Reviewed  COMPREHENSIVE METABOLIC PANEL - Abnormal; Notable for the following components:      Result Value   Glucose, Bld 304 (*)    ALT 16 (*)    All other components within normal limits  ACETAMINOPHEN LEVEL - Abnormal; Notable for the following components:   Acetaminophen (Tylenol), Serum <10 (*)  All other components within normal limits  CBC - Abnormal; Notable for the following components:   Platelets 417 (*)    All other components within normal limits  ETHANOL  SALICYLATE LEVEL  RAPID URINE DRUG SCREEN, HOSP PERFORMED    EKG  EKG Interpretation None       Radiology Ct Head Wo Contrast  Result Date: 12/07/2017 CLINICAL DATA:  Pain after fall. EXAM: CT HEAD WITHOUT CONTRAST TECHNIQUE: Contiguous axial images were obtained from the base of the skull through the vertex without intravenous contrast. COMPARISON:  September 19, 2017. FINDINGS: Brain: No subdural, epidural, or subarachnoid hemorrhage. Severely dilated lateral and third ventricles, not significantly changed. The third ventricle is dilated. The fourth ventricle is normal. 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 No acute cortical ischemia or infarct. No midline shift.  Cerebellum, brainstem, and basal cisterns are stable. Vascular: No hyperdense vessel or unexpected calcification. Skull: Normal. Negative for fracture or focal lesion. Sinuses/Orbits: Mucosal thickening in the left maxillary sinus. Paranasal sinuses, middle ears, and mastoid air cells otherwise normal. Other: None. IMPRESSION: 1. No posttraumatic changes intracranially. 2. 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 The degree of dilatation is unchanged since November of 2018 and November 2013. I suspect the patient has already had previous neuro surgical consultation. If not, such consultation is recommended. 3. No other acute abnormalities. Electronically Signed   By: Dorise Bullion III M.D   On: 12/07/2017 18:58    Procedures Procedures (including critical care time)  Medications Ordered in ED Medications  gabapentin (NEURONTIN) capsule 1,200 mg (1,200 mg Oral Given 12/08/17 0041)  insulin aspart (novoLOG) injection 12 Units (not administered)  insulin glargine (LANTUS) injection 38 Units (not administered)  traZODone (DESYREL) tablet 100 mg (100 mg Oral Given 12/08/17 0042)  venlafaxine XR (EFFEXOR-XR) 24 hr capsule 300 mg (not administered)     Initial Impression / Assessment and Plan / ED Course  I have reviewed the triage vital signs and the nursing notes.  Pertinent labs & imaging results that were available during my care of the patient were reviewed by me and considered in my medical decision making (see chart for details).    32 year old male with a history of bipolar, cannabis use, malingering, diabetes, obstructive hydrocephalus, who had just been discharged from the emergency department after concern for fall, presents with concern for suicidal ideation.  Patient had been discharged, and had asked for taxi vouchers and was told that we were unable to provide these.  Reports that he was out trying to find the bus stop, and could not find it,  and became frustrated, and felt he had no reason to live.  [employer] saw patient walking in front of a moving vehicle (he was not struck) and then they saw him picking up cans, and slitting his wrists with them. He is medically cleared.   Patient with active suicidal ideation, plan, threats, and attempt. I have IVCd him and placed consult to TTS.     Final Clinical Impressions(s) / ED Diagnoses   Final diagnoses:  Suicidal ideation    ED Discharge Orders    None       Gareth Morgan, MD 12/08/17 [phone]

## 2017-12-08 NOTE — ED Notes (Signed)
Pt asking x 2 "What time to do we order dinner?" Advised pt at 1500. Pt then stated he was still hungry. Advised pt he may have snack at 1500. Pt voiced understanding.

## 2017-12-08 NOTE — ED Notes (Signed)
Pt eating snack at this time.  No complaints voiced.  Sitter remains at bedside

## 2017-12-08 NOTE — ED Notes (Signed)
Pt ate almost all of his breakfast. Pt sitting in bed resting at this time.

## 2017-12-08 NOTE — ED Notes (Signed)
Dr Rubin PayorPickering aware of pt's gait - advised RN to continue to observe and advise.

## 2017-12-08 NOTE — ED Notes (Signed)
CBG 406  Dr. Shyrl NumbersNanavanti made aware.  Order received for Novolog 8 unitis SQ  Will recheck CBG at 22:00

## 2017-12-08 NOTE — ED Notes (Signed)
Pt eating snacks given.

## 2017-12-08 NOTE — ED Notes (Signed)
Pt ambulated to bathroom and back to room w/assistance x 2. States he normally walks the way he ambulated - shaky and shuffling his feet. When encouraged pt to lift feet, states "I'm trying to. Don't y'all understand?".

## 2017-12-08 NOTE — BH Assessment (Signed)
Tele Assessment Note   Patient Name: Mark [name] MRN: [id number] Referring Physician: Dr. Dorris Carnes. [place] Location of Patient: EDP Location of Provider: Behavioral Health TTS Department  Mark [name] is a 32 y.o. male who presented to [facility] on 12/07/17 with complaint of suicidal ideation and other depressive symptoms.  Pt was last assessed by TTS on 12/06/17.  At that time, Pt presented with suicidal ideation with plan and intent.  Pt was held overnight and discharged the following day.  Pt is homeless and lives in a shelter.  He stated that he has been suicidal for over a year, and that recently, he decided to kill himself by either refusing to take his insulin (Pt is diabetic) or walking into traffic.  Per IVC petition (completed by EDP), Pt tried to walk into traffic on 12/07/17.  Pt could not identify specific triggers, although he stated that he is in conflict with his sisters.  Pt endorsed the following symptoms:  Suicidal ideation with plan to stop taking medication or walk into traffic, despondency, irritability, hopelessness, and feelings of worthlessness.  Pt stated that ''thisis my last chance.'' He reported that he if does not get help, he will kill himself.  Pt denied current psychiatric or therapy services.  Pt also reported that he attempted suicide four times in the past.  A review of the notes indicates that Pt may malinger.  Pt also endorsed use of marijuana, although he stated that he has not used in over a month.  During assessment, Pt presented as alert and oriented.  He had good eye contact and was cooperative.  Pt was dressed in scrubs and presented as appropriately groomed.  Mood was sad.  Affect was sad and irritable.  Pt endorsed suicidal ideation with plan and other depressive symptoms.  Pt denied hallucination and homicidal ideation.  Pt endorsed recent self-injury -- cutting his wrist (no sutures/stitches required).  Pt's Pt's speech was normal in rate, rhythm, and volume.   Pt's thought processes were within normal range, and thought content was logical.  There was no evidence of delusion.  Pt's memory and concentration were intact.  Insight and judgment were fair. Impulse control was poor.  Consulted with Julian Hy, NP who recommended that Pt be observed and stabilized and re-evaluated in the AM.  Diagnosis: F33.2 MDD Recurrent, Severe, w/o psychotic features  Past Medical History:  Past Medical History:  Diagnosis Date  . Diabetes mellitus without complication ([facility])   . Hydrocephalus   . Kidney stones     Past Surgical History:  Procedure Laterality Date  . KIDNEY STONE SURGERY      Family History:  Family History  Problem Relation Age of Onset  . Breast cancer Mother     Social History:  reports that he has been smoking cigarettes.  He has been smoking about 1.00 pack per day. he has never used smokeless tobacco. He reports that he uses drugs. Drug: Marijuana. He reports that he does not drink alcohol.  Additional Social History:  Alcohol / Drug Use Pain Medications: See MAR Prescriptions: See MAR Over the Counter: See MAR History of alcohol / drug use?: Yes Substance #1 Name of Substance 1: Marijuana 1 - Last Use / Amount: Pt stated over a month ago  CIWA: CIWA-Ar BP: 117/90 Pulse Rate: 94 COWS:    Allergies:  Allergies  Allergen Reactions  . Mushroom Extract Complex Hives, Itching and Nausea And Vomiting  . Penicillins Anaphylaxis, Hives and Swelling  Has patient had a PCN reaction causing immediate rash, facial/tongue/throat swelling, SOB or lightheadedness with hypotension: Yes Has patient had a PCN reaction causing severe rash involving mucus membranes or skin necrosis: No Has patient had a PCN reaction that required hospitalization Yes Has patient had a PCN reaction occurring within the last 10 years: Yes If all of the above answers are "NO", then may proceed with Cephalosporin use.  . Sulfa Antibiotics Anaphylaxis and  Hives  . Clindamycin/Lincomycin Hives    Home Medications:  (Not in a hospital admission)  OB/GYN Status:  No LMP for male patient.  General Assessment Data [facility] Assessment: In system Is this a Tele or Face-to-Face Assessment?: Tele Assessment Is this an Initial Assessment or a Re-assessment for this encounter?: Initial Assessment Marital status: Single Living Arrangements: Other (Comment), Alone(Homeless) Can pt return to current living arrangement?: Yes Admission Status: Involuntary Is patient capable of signing voluntary admission?: Yes Referral Source: Self/Family/Friend Insurance type: self-pay     Crisis Care Plan Living Arrangements: Other (Comment), Alone(Homeless) Name of Psychiatrist: None Name of Therapist: None  Education Status Is patient currently in school?: No  Risk to self with the past 6 months Suicidal Ideation: Yes-Currently Present Has patient been a risk to self within the past 6 months prior to admission? : No Suicidal Intent: Yes-Currently Present Has patient had any suicidal intent within the past 6 months prior to admission? : No Is patient at risk for suicide?: No Suicidal Plan?: Yes-Currently Present Has patient had any suicidal plan within the past 6 months prior to admission? : No Specify Current Suicidal Plan: stop taking insulin; walk into traffic Access to Means: Yes Specify Access to Suicidal Means: insulin What has been your use of drugs/alcohol within the last 12 months?: Marijuana Previous Attempts/Gestures: Yes How many times?: (4) Triggers for Past Attempts: Unknown Intentional Self Injurious Behavior: Cutting Comment - Self Injurious Behavior: Pt stated that he cut his wrists on 12/07/17 Family Suicide History: No Recent stressful life event(s): Legal Issues, Other (Comment)(Homeless) Persecutory voices/beliefs?: No Depression: Yes Depression Symptoms: Feeling worthless/self pity, Feeling  angry/irritable Substance abuse history and/or treatment for substance abuse?: Yes Suicide prevention information given to non-admitted patients: Not applicable  Risk to Others within the past 6 months Homicidal Ideation: No Does patient have any lifetime risk of violence toward others beyond the six months prior to admission? : No Thoughts of Harm to Others: No Current Homicidal Intent: No Current Homicidal Plan: No Access to Homicidal Means: No History of harm to others?: No Assessment of Violence: None Noted Does patient have access to weapons?: No Criminal Charges Pending?: Yes Describe Pending Criminal Charges: Possession Does patient have a court date: Yes Court Date: 01/03/18 Is patient on probation?: No  Psychosis Hallucinations: None noted Delusions: None noted  Mental Status Report Appearance/Hygiene: In scrubs Eye Contact: Fair Motor Activity: Unsteady, Freedom of movement Speech: Logical/coherent Level of Consciousness: Alert Mood: Irritable, Sad Affect: Appropriate to circumstance Anxiety Level: None Thought Processes: Relevant, Coherent Judgement: Partial Orientation: Person, Place, Time Obsessive Compulsive Thoughts/Behaviors: None  Cognitive Functioning Concentration: Fair Memory: Remote Intact, Recent Intact IQ: Average Insight: Poor Impulse Control: Poor Appetite: Fair Sleep: No Change Total Hours of Sleep: 7 Vegetative Symptoms: None  ADLScreening [facility] Assessment Services) Patient's cognitive ability adequate to safely complete daily activities?: Yes Patient able to express need for assistance with ADLs?: Yes Independently performs ADLs?: Yes (appropriate for developmental age)  Prior Inpatient Therapy Prior Inpatient Therapy: Yes  Prior Therapy Dates: 2018,2017 Prior Therapy Facilty/Provider(s): [facility], [facility] Reason for Treatment: MH isssues  Prior Outpatient Therapy Prior Outpatient Therapy: No Prior Therapy Dates: NA Prior Therapy  Facilty/Provider(s): NA Reason for Treatment: NA Does patient have an ACCT team?: No Does patient have Intensive In-House Services?  : No Does patient have [facility] services? : No Does patient have P4CC services?: No  ADL Screening (condition at time of admission) Patient's cognitive ability adequate to safely complete daily activities?: Yes Is the patient deaf or have difficulty hearing?: No Does the patient have difficulty seeing, even when wearing glasses/contacts?: No Does the patient have difficulty concentrating, remembering, or making decisions?: No Patient able to express need for assistance with ADLs?: Yes Does the patient have difficulty dressing or bathing?: No Independently performs ADLs?: Yes (appropriate for developmental age) Does the patient have difficulty walking or climbing stairs?: No Weakness of Legs: None Weakness of Arms/Hands: None  Home Assistive Devices/Equipment Home Assistive Devices/Equipment: None  Therapy Consults (therapy consults require a physician order) PT Evaluation Needed: No OT Evalulation Needed: No SLP Evaluation Needed: No Abuse/Neglect Assessment (Assessment to be complete while patient is alone) Abuse/Neglect Assessment Can Be Completed: Yes Physical Abuse: Denies Verbal Abuse: Denies Sexual Abuse: Denies Exploitation of patient/patient's resources: Denies Self-Neglect: Denies Values / Beliefs Cultural Requests During Hospitalization: None Spiritual Requests During Hospitalization: None Consults Spiritual Care Consult Needed: No Social Work Consult Needed: No      Additional Information 1:1 In Past 12 Months?: No CIRT Risk: No Elopement Risk: No Does patient have medical clearance?: Yes     Disposition:  Disposition Initial Assessment Completed for this Encounter: Yes Disposition of Patient: Other dispositions Other disposition(s): Other (Comment)(Observe overnight, stabilize, re-eval in AM)  This service was provided  via telemedicine using a 2-way, interactive audio and video technology.  Names of all persons participating in this telemedicine service and their role in this encounter. Name: Mark [name] Role: Patient             Earline Mayotte 12/08/2017 6:24 PM

## 2017-12-08 NOTE — ED Notes (Signed)
Pt continues to use the urinal. Encouraged pt to ambulate next time he needs to void d/t inpt placement. States "Ma'am, I just fell before I came in here and I don't want to walk". After much encouragement, voiced understanding and agreement.

## 2017-12-09 DIAGNOSIS — Z590 Homelessness: Secondary | ICD-10-CM

## 2017-12-09 DIAGNOSIS — R4587 Impulsiveness: Secondary | ICD-10-CM

## 2017-12-09 DIAGNOSIS — R450 Nervousness: Secondary | ICD-10-CM

## 2017-12-09 DIAGNOSIS — F332 Major depressive disorder, recurrent severe without psychotic features: Secondary | ICD-10-CM

## 2017-12-09 DIAGNOSIS — F419 Anxiety disorder, unspecified: Secondary | ICD-10-CM

## 2017-12-09 DIAGNOSIS — R45851 Suicidal ideations: Secondary | ICD-10-CM

## 2017-12-09 LAB — CBG MONITORING, ED
GLUCOSE-CAPILLARY: 292 mg/dL — AB (ref 65–99)
Glucose-Capillary: 159 mg/dL — ABNORMAL HIGH (ref 65–99)
Glucose-Capillary: 174 mg/dL — ABNORMAL HIGH (ref 65–99)
Glucose-Capillary: 326 mg/dL — ABNORMAL HIGH (ref 65–99)

## 2017-12-09 NOTE — ED Notes (Signed)
Pt requesting IM injection of Geodon.

## 2017-12-09 NOTE — ED Provider Notes (Signed)
Pt is awaiting psych placement.  Vitals:   12/09/17 0620 12/09/17 1206  BP: 113/65 125/82  Pulse: 69 65  Resp: 18 18  Temp: 98.3 F (36.8 C) 97.8 F (36.6 C)  SpO2: 97% 98%    Medically stable at this time.   Linwood DibblesKnapp, Honesty Menta, MD 12/09/17 1321

## 2017-12-09 NOTE — Consult Note (Signed)
[facility] Tele-Psych Consult   Reason for Consult:  Suicidal ideation Referring Physician:  EDP Patient Identification: Mark [name] MRN:  [id number] Principal Diagnosis: MDD (major depressive disorder) Diagnosis:   Patient Active Problem List   Diagnosis Date Noted  . Adjustment disorder with depressed mood [F43.21] 12/07/2017  . Noncompliance [Z91.19] 10/17/2017  . Dysthymia [F34.1] 10/08/2017  . Personality disorder ([place]) [F60.9] 09/26/2017  . 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 Obstructive hydrocephalus [G91.1] 09/19/2017  . Diabetes ([place]) [E11.9] 09/11/2017  . Hypoglycemia [E16.2] 09/09/2017  . MDD (major depressive disorder) [F32.9] 08/22/2017  . 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 Nausea & vomiting [R11.2] 08/18/2017  . Abdominal pain [R10.9] 08/18/2017  . Hyponatremia [E87.1] 08/18/2017  . DKA (diabetic ketoacidoses) ([place]) [E13.10] 11/24/2015  . Diabetic keto-acidosis ([place]) [E13.10] 10/23/2015    Total Time spent with patient: 30 minutes  Subjective:   Mark [name] is a 32 y.o. male patient admitted with reports of severe depression and psychosocial stressors.   HPI:    Per initial Tele Assessment Note by Krystal Eaton, Counselor 12/08/2017:   Mark [name] is a 32 y.o. male who presented to [facility] on [facility]/07[facility]17 with complaint of suicidal ideation and other depressive symptoms.  Pt was last assessed by TTS on 12/06/17.  At that time, Pt presented with suicidal ideation with plan and intent.  Pt was held overnight and discharged the following day.  Pt is homeless and lives in a shelter.  He stated that he has been suicidal for over a year, and that recently, he decided to kill himself by either refusing to take his insulin (Pt is diabetic) or walking into traffic.  Per IVC petition (completed by EDP), Pt tried to walk into traffic on 12/07/17.  Pt could not identify specific triggers, although he stated that he is in  conflict with his sisters.  Pt endorsed the following symptoms:  Suicidal ideation with plan to stop taking medication or walk into traffic, despondency, irritability, hopelessness, and feelings of worthlessness.  Pt stated that ''thisis my last chance.'' He reported that he if does not get help, he will kill himself.  Pt denied current psychiatric or therapy services.  Pt also reported that he attempted suicide four times in the past.  A review of the notes indicates that Pt may malinger.  Pt also endorsed use of marijuana, although he stated that he has not used in over a month.  Per am psych evaluation:   Patient continues to report severe depressive symptoms. States "I have thought about taking all my insulin. I lost my job and my mother died last year. I am just fed up with life. I am done. I want to steal a knife from [place] and stab myself to death. I will do that if I leave here. I have had multiple past attempts. I have tried to overdose and cut myself." Patient was recently discharged from the [facility] with resources but returns with active suicidal ideation. He is unable to contract for his safety at this time. Mark [name] was found by the police using cans to cut his wrist prior to admission and is currently under an IVC. According to chart review was last inpatient at [facility] in December 2018. Patient reports inability to follow up due to having "No car, nothing. I can't get anywhere like a clinic." Discussed case with Dr. Dwyane Dee. Recommend inpatient treatment due to high risk for serious self harm with reported plan.  Past Psychiatric History: As above  Risk to Self: Reports multiple suicidal plans to overdose on insulin or walk in front of a car  Risk to Others: None Prior Inpatient Therapy: Prior Inpatient Therapy: Yes Prior Therapy Dates: 2018,2017 Prior Therapy Facilty/Provider(s): [facility] Reason for Treatment: MH isssues Prior Outpatient Therapy: Prior Outpatient Therapy: No Prior Therapy  Dates: NA Prior Therapy Facilty/Provider(s): NA Reason for Treatment: NA Does patient have an ACCT team?: No Does patient have Intensive In-House Services?  : No Does patient have [facility] services? : No Does patient have P4CC services?: No  Past Medical History:  Past Medical History:  Diagnosis Date  . Diabetes mellitus without complication ([place])   . Hydrocephalus   . Kidney stones     Past Surgical History:  Procedure Laterality Date  . KIDNEY STONE SURGERY     Family History:  Family History  Problem Relation Age of Onset  . Breast cancer Mother    Family Psychiatric  History: Unknown Social History:  Social History   Substance and Sexual Activity  Alcohol Use No   Comment: occassional      Social History   Substance and Sexual Activity  Drug Use Yes  . Types: Marijuana   Comment: ''I haven't used in over a month"    Social History   Socioeconomic History  . Marital status: Single    Spouse name: None  . Number of children: None  . Years of education: None  . Highest education level: None  Social Needs  . Financial resource strain: None  . Food insecurity - worry: None  . Food insecurity - inability: None  . Transportation needs - medical: None  . Transportation needs - non-medical: None  Occupational History  . None  Tobacco Use  . Smoking status: Current Every Day Smoker    Packs/day: 1.00    Types: Cigarettes  . Smokeless tobacco: Never Used  Substance and Sexual Activity  . Alcohol use: No    Comment: occassional   . Drug use: Yes    Types: Marijuana    Comment: ''I haven't used in over a month"  . Sexual activity: No  Other Topics Concern  . None  Social History Narrative  . None   Additional Social History:    Allergies:   Allergies  Allergen Reactions  . Mushroom Extract Complex Hives, Itching and Nausea And Vomiting  . Penicillins Anaphylaxis, Hives and Swelling    Has patient had a PCN reaction causing immediate rash,  facial/tongue/throat swelling, SOB or lightheadedness with hypotension: Yes Has patient had a PCN reaction causing severe rash involving mucus membranes or skin necrosis: No Has patient had a PCN reaction that required hospitalization Yes Has patient had a PCN reaction occurring within the last 10 years: Yes If all of the above answers are "NO", then may proceed with Cephalosporin use.  . Sulfa Antibiotics Anaphylaxis and Hives  . Clindamycin/Lincomycin Hives    Labs:  Results for orders placed or performed during the hospital encounter of 12/07/17 (from the past 48 hour(s))  Comprehensive metabolic panel     Status: Abnormal   Collection Time: 12/07/17  9:32 PM  Result Value Ref Range   Sodium 137 135 - 145 mmol/L   Potassium 3.7 3.5 - 5.1 mmol/L   Chloride 102 101 - 111 mmol/L   CO2 23 22 - 32 mmol/L   Glucose, Bld 304 (H) 65 - 99 mg/dL   BUN 11 6 - 20 mg/dL   Creatinine,  Ser 1.02 0.61 - 1.24 mg/dL   Calcium 9.4 8.9 - 10.3 mg/dL   Total Protein 7.6 6.5 - 8.1 g/dL   Albumin 4.1 3.5 - 5.0 g/dL   AST 25 15 - 41 U/L   ALT 16 (L) 17 - 63 U/L   Alkaline Phosphatase 95 38 - 126 U/L   Total Bilirubin 0.7 0.3 - 1.2 mg/dL   GFR calc non Af Amer >60 >60 mL/min   GFR calc Af Amer >60 >60 mL/min    Comment: (NOTE) The eGFR has been calculated using the CKD EPI equation. This calculation has not been validated in all clinical situations. eGFR's persistently <60 mL/min signify possible Chronic Kidney Disease.    Anion gap 12 5 - 15    Comment: Performed at [facility] [address], [place], [place] [address]  Ethanol     Status: None   Collection Time: 12/07/17  9:32 PM  Result Value Ref Range   Alcohol, Ethyl (B) <10 <10 mg/dL    Comment:        LOWEST DETECTABLE LIMIT FOR SERUM ALCOHOL IS 10 mg/dL FOR MEDICAL PURPOSES ONLY Performed at [facility], [place] [address], [place], [place] [address]   Salicylate level     Status: None   Collection Time: 12/07/17  9:32 PM   Result Value Ref Range   Salicylate Lvl <0.8 2.8 - 30.0 mg/dL    Comment: Performed at [facility] [address], [place], [place] [address]  Acetaminophen level     Status: Abnormal   Collection Time: 12/07/17  9:32 PM  Result Value Ref Range   Acetaminophen (Tylenol), Serum <10 (L) 10 - 30 ug/mL    Comment: Performed at [facility] [address] [place], [place] [address]  cbc     Status: Abnormal   Collection Time: 12/07/17  9:32 PM  Result Value Ref Range   WBC 10.1 4.0 - 10.5 K/uL   RBC 4.28 4.22 - 5.81 MIL/uL   Hemoglobin 13.4 13.0 - 17.0 g/dL   HCT 39.4 39.0 - 52.0 %   MCV 92.1 78.0 - 100.0 fL   MCH 31.3 26.0 - 34.0 pg   MCHC 34.0 30.0 - 36.0 g/dL   RDW 11.9 11.5 - 15.5 %   Platelets 417 (H) 150 - 400 K/uL    Comment: Performed at [facility] [address] [place],  [address]  Rapid urine drug screen (hospital performed)     Status: None   Collection Time: 12/07/17 11:18 PM  Result Value Ref Range   Opiates NONE DETECTED NONE DETECTED   Cocaine NONE DETECTED NONE DETECTED   Benzodiazepines NONE DETECTED NONE DETECTED   Amphetamines NONE DETECTED NONE DETECTED   Tetrahydrocannabinol NONE DETECTED NONE DETECTED   Barbiturates NONE DETECTED NONE DETECTED    Comment: (NOTE) DRUG SCREEN FOR MEDICAL PURPOSES ONLY.  IF CONFIRMATION IS NEEDED FOR ANY PURPOSE, NOTIFY LAB WITHIN 5 DAYS. LOWEST DETECTABLE LIMITS FOR URINE DRUG SCREEN Drug Class                     Cutoff (ng/mL) Amphetamine and metabolites    1000 Barbiturate and metabolites    200 Benzodiazepine                 580 Tricyclics and metabolites     300 Opiates and metabolites        300 Cocaine and metabolites  300 THC                            50 Performed at [facility],  [address] [place], [place] [address]   CBG monitoring, ED     Status: Abnormal   Collection Time: 12/08/17 12:42 AM  Result Value Ref Range   Glucose-Capillary 273 (H) 65 - 99  mg/dL  CBG monitoring, ED     Status: None   Collection Time: 12/08/17  7:29 AM  Result Value Ref Range   Glucose-Capillary 84 65 - 99 mg/dL   Comment 1 Notify RN    Comment 2 Document in Chart   POC CBG, ED     Status: Abnormal   Collection Time: 12/08/17 11:38 AM  Result Value Ref Range   Glucose-Capillary 364 (H) 65 - 99 mg/dL   Comment 1 Notify RN   POC CBG, ED     Status: Abnormal   Collection Time: 12/08/17  4:35 PM  Result Value Ref Range   Glucose-Capillary 125 (H) 65 - 99 mg/dL   Comment 1 Notify RN   CBG monitoring, ED     Status: Abnormal   Collection Time: 12/08/17  9:01 PM  Result Value Ref Range   Glucose-Capillary 406 (H) 65 - 99 mg/dL  POC CBG, ED     Status: Abnormal   Collection Time: 12/08/17 10:19 PM  Result Value Ref Range   Glucose-Capillary 423 (H) 65 - 99 mg/dL   Comment 1 Notify RN   CBG monitoring, ED     Status: Abnormal   Collection Time: 12/09/17 12:33 AM  Result Value Ref Range   Glucose-Capillary 292 (H) 65 - 99 mg/dL  CBG monitoring, ED     Status: Abnormal   Collection Time: 12/09/17  6:22 AM  Result Value Ref Range   Glucose-Capillary 159 (H) 65 - 99 mg/dL    Current Facility-Administered Medications  Medication Dose Route Frequency Provider Last Rate Last Dose  . gabapentin (NEURONTIN) capsule 1,200 mg  1,200 mg Oral TID Larene Pickett, PA-C   1,200 mg at 12/08/17 2207  . insulin aspart (novoLOG) injection 0-15 Units  0-15 Units Subcutaneous TID WC Cardama, Grayce Sessions, MD   3 Units at 12/09/17 [id number]  . insulin aspart (novoLOG) injection 0-5 Units  0-5 Units Subcutaneous QHS Fatima Blank, MD   5 Units at 12/08/17 2327  . insulin detemir (LEVEMIR) injection 38 Units  38 Units Subcutaneous QHS Fatima Blank, MD   38 Units at 12/08/17 2208  . nicotine (NICODERM CQ - dosed in mg/24 hours) patch 21 mg  21 mg Transdermal Daily Cardama, Grayce Sessions, MD   21 mg at 12/08/17 1017  . traZODone (DESYREL) tablet 100 mg  100 mg  Oral QHS PRN Larene Pickett, PA-C   100 mg at 12/08/17 0042  . venlafaxine XR (EFFEXOR-XR) 24 hr capsule 300 mg  300 mg Oral Q breakfast Larene Pickett, PA-C   300 mg at 12/09/17 5701   Current Outpatient Medications  Medication Sig Dispense Refill  . diphenhydramine-acetaminophen (TYLENOL PM) 25-500 MG TABS tablet Take 2 tablets by mouth at bedtime as needed (for sleep).    . gabapentin (NEURONTIN) 400 MG capsule Take 3 capsules (1,200 mg total) by mouth 3 (three) times daily. 270 capsule 1  . HUMALOG 100 UNIT/ML injection Inject 12 Units into the skin 3 (three) times daily with meals.  0  .  LEVEMIR 100 UNIT/ML injection Inject 38 Units into the skin at bedtime.  0  . traZODone (DESYREL) 100 MG tablet Take 1 tablet (100 mg total) by mouth at bedtime as needed for sleep. 30 tablet 2  . venlafaxine XR (EFFEXOR-XR) 150 MG 24 hr capsule Take 2 capsules (300 mg total) by mouth daily with breakfast. 60 capsule 1  . blood glucose meter kit and supplies KIT Dispense based on patient and insurance preference. Use up to four times daily as directed. (FOR ICD-9 250.00, 250.01). (Patient not taking: Reported on 12/06/2017) 1 each 0  . haloperidol decanoate (HALDOL DECANOATE) 100 MG/ML injection Inject 1 mL (100 mg total) into the muscle every 28 (twenty-eight) days. (Patient not taking: Reported on 12/06/2017) 1 mL 1  . insulin aspart (NOVOLOG) 100 UNIT/ML FlexPen Inject 12 Units into the skin 3 (three) times daily before meals.      Musculoskeletal:  Unable to assess via camera    Psychiatric Specialty Exam: Physical Exam  Constitutional: He is oriented to person, place, and time. He appears well-developed and well-nourished.  HENT:  Head: Normocephalic.  Respiratory: Effort normal.  Musculoskeletal: Normal range of motion.  Neurological: He is alert and oriented to person, place, and time.  Psychiatric: Thought content normal. His mood appears anxious. His speech is delayed. He is withdrawn.  Cognition and memory are normal. He expresses impulsivity. He exhibits a depressed mood.    Review of Systems  Psychiatric/Behavioral: Positive for depression. Negative for hallucinations, memory loss, substance abuse and suicidal ideas. The patient is nervous/anxious. The patient does not have insomnia.   All other systems reviewed and are negative.   Blood pressure 113/65, pulse 69, temperature 98.3 F (36.8 C), temperature source Oral, resp. rate 18, SpO2 97 %.There is no height or weight on file to calculate BMI.  General Appearance: Disheveled  Eye Contact:  Fair  Speech:  Clear and Coherent  Volume:  Normal  Mood:  Dysphoric and Hopeless  Affect:  Congruent  Thought Process:  Coherent and Goal Directed  Orientation:  Full (Time, Place, and Person)  Thought Content:  WDL  Suicidal Thoughts:  Yes.  with intent/plan  Homicidal Thoughts:  No  Memory:  Immediate;   Good Recent;   Good Remote;   Fair  Judgement:  Poor  Insight:  Lacking  Psychomotor Activity:  Normal  Concentration:  Concentration: Good and Attention Span: Good  Recall:  Good  Fund of Knowledge:  Good  Language:  Good  Akathisia:  No  Handed:  Right  AIMS (if indicated):     Assets:  Communication Skills  ADL's:  Intact  Cognition:  WNL  Sleep:        Treatment Plan Summary: Plan Major Depressive Disorder . Admit inpatient due to active suicidal ideation.   Disposition: Recommend psychiatric Inpatient admission when medically cleared. Supportive therapy provided about ongoing stressors. Discussed crisis plan, support from social network, calling 911, coming to the Emergency Department, and calling Suicide Hotline.  Elmarie Shiley, NP 12/09/2017 10:07 AM

## 2017-12-09 NOTE — ED Notes (Signed)
Pt ambulated to restroom with stand-by assist by sitter

## 2017-12-09 NOTE — Progress Notes (Signed)
Pt. meets criteria for inpatient treatment per Fransisca KaufmannLaura Davis NP and Dr. Lucianne MussKumar.  Referred out to the following hospitals:  [facility]     [facility]  [facility]  [facility]  [facility]  [facility]  [facility]  [facility]        Disposition CSW will continue to follow for placement.  Timmothy EulerJean T. Kaylyn LimSutter, MSW, LCSWA Disposition Clinical Social Work [phone] (cell) [phone] (office)

## 2017-12-09 NOTE — ED Triage Notes (Signed)
PT incont.  Of urine  While sleeping.

## 2017-12-10 LAB — CBG MONITORING, ED
GLUCOSE-CAPILLARY: 339 mg/dL — AB (ref 65–99)
GLUCOSE-CAPILLARY: 500 mg/dL — AB (ref 65–99)
GLUCOSE-CAPILLARY: 244 mg/dL — AB (ref 65–99)
Glucose-Capillary: 365 mg/dL — ABNORMAL HIGH (ref 65–99)
Glucose-Capillary: 500 mg/dL — ABNORMAL HIGH (ref 65–99)
Glucose-Capillary: 323 mg/dL — ABNORMAL HIGH (ref 65–99)
Glucose-Capillary: 338 mg/dL — ABNORMAL HIGH (ref 65–99)

## 2017-12-10 MED ORDER — INSULIN ASPART 100 UNIT/ML ~~LOC~~ SOLN
20.0000 [IU] | Freq: Once | SUBCUTANEOUS | Status: AC
  Administered 2017-12-10: 20 [IU] via SUBCUTANEOUS
  Filled 2017-12-10: qty 1

## 2017-12-10 MED ORDER — ZIPRASIDONE MESYLATE 20 MG IM SOLR
20.0000 mg | Freq: Once | INTRAMUSCULAR | Status: AC
  Administered 2017-12-10: 20 mg via INTRAMUSCULAR
  Filled 2017-12-10: qty 20

## 2017-12-10 MED ORDER — LORAZEPAM 2 MG/ML IJ SOLN
1.0000 mg | Freq: Once | INTRAMUSCULAR | Status: AC
  Administered 2017-12-10: 1 mg via INTRAMUSCULAR
  Filled 2017-12-10: qty 1

## 2017-12-10 MED ORDER — STERILE WATER FOR INJECTION IJ SOLN
INTRAMUSCULAR | Status: AC
  Filled 2017-12-10: qty 10

## 2017-12-10 NOTE — ED Triage Notes (Signed)
PT waited while Sitter went to get the ice water he requested to get out of bed. Pt unsteady ambulation and needs one assist to ambulate to bath room.

## 2017-12-10 NOTE — ED Notes (Addendum)
Pt ambulated with one person assist to bed, pt had unsteady gait

## 2017-12-10 NOTE — ED Notes (Signed)
Pt increasingly agitated.  Increasingly belligerent to staff. EDP Rancour advised. Orders received

## 2017-12-10 NOTE — B[facility] Counselor (Signed)
Patient re-assessed this a.m., patient report feeling depressed, anxious, and agitated. Patient could not recall source of emotional distress, then he expressed he feels it's because he's no longer speaking with his family. Patient report him and his older sister got into an agreement before he came into the hospital. Patient report his sister got upset with him when he called her husband on his job asking if he could live with them. Patient report his sister became upset due to her husband answered the phone while he was at work and almost lost his job.  Patient continues to endorse suicidal ideations with a plan to stop taking his insulin. Patient also report unresolved grief of losing his mother last year and having no direction in life. Report today he's going to refuse to stop taking his insulin, stating "it's my choice."  Patient report he has nothing to live for no kids, no wife, strained relationship with sisters and [place] patient report, " he does not give a shit about me."  SI with plan to stop taking insulin. Denies HI and AVH.

## 2017-12-10 NOTE — ED Notes (Signed)
Pt called out.  Bed is wet. Incontinence. Pt given clean scrubs, allowed to shower, bed linens changed.

## 2017-12-10 NOTE — ED Notes (Addendum)
Pt too lethargic due to geodon and ativan for PO meds, will continue to monitor, visualize chest rise and fall. Attempting to wake pt opt will open eyes for a second and fall back asleep. Unable to keep awake at this time

## 2017-12-10 NOTE — ED Notes (Signed)
Pt stating he is feeling more anxiety and having more SI thoughts, pt asked if he can be given anymore medications to help calm him. RN informing provider at this time

## 2017-12-10 NOTE — ED Triage Notes (Signed)
After Pt completed TTS ,Pt reported he felt like hurting someone. Pt did not have a name.

## 2017-12-10 NOTE — ED Notes (Signed)
Pt willingly took IM injection of geodon and ativan to left anterior thigh without protest

## 2017-12-10 NOTE — ED Notes (Addendum)
Carb Modified Fluid Consistency Thin Diet Ordered for Dinner.

## 2017-12-11 LAB — COMPREHENSIVE METABOLIC PANEL
ALK PHOS: 104 U/L (ref 38–126)
ALT: 16 U/L — ABNORMAL LOW (ref 17–63)
ANION GAP: 16 — AB (ref 5–15)
AST: 25 U/L (ref 15–41)
Albumin: 3.8 g/dL (ref 3.5–5.0)
BUN: 20 mg/dL (ref 6–20)
CALCIUM: 9.7 mg/dL (ref 8.9–10.3)
CHLORIDE: 93 mmol/L — AB (ref 101–111)
CO2: 21 mmol/L — AB (ref 22–32)
Creatinine, Ser: 1.37 mg/dL — ABNORMAL HIGH (ref 0.61–1.24)
GFR calc Af Amer: >60 mL/min (ref >60)
GFR calc non Af Amer: >60 mL/min (ref >60)
GLUCOSE: 500 mg/dL — AB (ref 65–99)
Potassium: 3.9 mmol/L (ref 3.5–5.1)
SODIUM: 130 mmol/L — AB (ref 135–145)
Total Bilirubin: 1.1 mg/dL (ref 0.3–1.2)
Total Protein: 7.2 g/dL (ref 6.5–8.1)

## 2017-12-11 LAB — BASIC METABOLIC PANEL
Anion gap: 9 (ref 5–15)
BUN: 16 mg/dL (ref 6–20)
CHLORIDE: 100 mmol/L — AB (ref 101–111)
CO2: 26 mmol/L (ref 22–32)
Calcium: 8.9 mg/dL (ref 8.9–10.3)
Creatinine, Ser: 1.14 mg/dL (ref 0.61–1.24)
GFR calc Af Amer: >60 mL/min (ref >60)
GLUCOSE: 290 mg/dL — AB (ref 65–99)
POTASSIUM: 4 mmol/L (ref 3.5–5.1)
Sodium: 135 mmol/L (ref 135–145)

## 2017-12-11 LAB — CBC WITH DIFFERENTIAL/PLATELET
BASOS ABS: 0 10*3/uL (ref 0.0–0.1)
Basophils Relative: 0 %
EOS ABS: 0.1 10*3/uL (ref 0.0–0.7)
Eosinophils Relative: 1 %
HCT: 41.1 % (ref 39.0–52.0)
HEMOGLOBIN: 14 g/dL (ref 13.0–17.0)
LYMPHS PCT: 20 %
Lymphs Abs: 2.4 10*3/uL (ref 0.7–4.0)
MCH: 31.3 pg (ref 26.0–34.0)
MCHC: 34.1 g/dL (ref 30.0–36.0)
MCV: 91.7 fL (ref 78.0–100.0)
Monocytes Absolute: 0.4 10*3/uL (ref 0.1–1.0)
Monocytes Relative: 3 %
NEUTROS ABS: 8.9 10*3/uL — AB (ref 1.7–7.7)
NEUTROS PCT: 76 %
PLATELETS: 350 10*3/uL (ref 150–400)
RBC: 4.48 MIL/uL (ref 4.22–5.81)
RDW: 12.2 % (ref 11.5–15.5)
WBC: 11.8 10*3/uL — ABNORMAL HIGH (ref 4.0–10.5)

## 2017-12-11 LAB — CBG MONITORING, ED
GLUCOSE-CAPILLARY: 351 mg/dL — AB (ref 65–99)
Glucose-Capillary: 564 mg/dL (ref 65–99)
Glucose-Capillary: 451 mg/dL — ABNORMAL HIGH (ref 65–99)
Glucose-Capillary: 365 mg/dL — ABNORMAL HIGH (ref 65–99)
Glucose-Capillary: 286 mg/dL — ABNORMAL HIGH (ref 65–99)
Glucose-Capillary: 257 mg/dL — ABNORMAL HIGH (ref 65–99)
Glucose-Capillary: 376 mg/dL — ABNORMAL HIGH (ref 65–99)

## 2017-12-11 MED ORDER — LORAZEPAM 2 MG/ML IJ SOLN
1.0000 mg | Freq: Once | INTRAMUSCULAR | Status: AC
  Administered 2017-12-11: 1 mg via INTRAMUSCULAR
  Filled 2017-12-11: qty 1

## 2017-12-11 MED ORDER — ZIPRASIDONE MESYLATE 20 MG IM SOLR
10.0000 mg | Freq: Once | INTRAMUSCULAR | Status: AC
  Administered 2017-12-12: 10 mg via INTRAMUSCULAR
  Filled 2017-12-11 (×2): qty 20

## 2017-12-11 MED ORDER — LORAZEPAM 2 MG/ML IJ SOLN
2.0000 mg | Freq: Once | INTRAMUSCULAR | Status: AC
  Administered 2017-12-11: 2 mg via INTRAMUSCULAR
  Filled 2017-12-11: qty 1

## 2017-12-11 MED ORDER — INSULIN ASPART 100 UNIT/ML ~~LOC~~ SOLN
15.0000 [IU] | Freq: Once | SUBCUTANEOUS | Status: AC
  Administered 2017-12-11: 15 [IU] via SUBCUTANEOUS

## 2017-12-11 MED ORDER — SODIUM CHLORIDE 0.9 % IV BOLUS (SEPSIS)
2000.0000 mL | Freq: Once | INTRAVENOUS | Status: AC
Start: 2017-12-11 — End: 2017-12-11
  Administered 2017-12-11: 2000 mL via INTRAVENOUS

## 2017-12-11 MED ORDER — HALOPERIDOL LACTATE 5 MG/ML IJ SOLN
5.0000 mg | Freq: Once | INTRAMUSCULAR | Status: AC
  Administered 2017-12-11: 5 mg via INTRAMUSCULAR
  Filled 2017-12-11: qty 1

## 2017-12-11 NOTE — ED Notes (Signed)
After insertion of IV patient becoming increasingly agitated, states "you can't stop me from killing myself. I came here for help, it is messed up you won't let me kill myself".

## 2017-12-11 NOTE — ED Provider Notes (Signed)
  Physical Exam  BP 109/65 (BP Location: Left Arm)   Pulse 74   Temp 98.3 F (36.8 C) (Oral)   Resp 17   SpO2 95%   Physical Exam  ED Course/Procedures     Procedures  MDM  I was called around 7:30 am regarding hyperglycemia. Patient has hx of DM and has been refusing his levemir consistently. He is IVC by previous provider and is suppose to go to psych facility. Has some vomiting as well. Given 15 U regular insulin around 7 am. Since he is agitated, will give IM ativan, haldol. I told the nurse that he is under IVC and cannot refuse appropriate medical interventions. Plan to get CBC, CMP, give 2 L NS bolus.   9 am CMP showed CR 1.4, CO2 21, glucose 500, AG 16. I think likely from dehydration. His CBG dec to 365 after 2 L NS bolus. Will recheck BMP.  11 am Repeat BMP showed glucose 290 with AG of 9. I don't think he is in DKA. Elevated AG earlier likely from dehydration. He is on premeal insulin and has levemir ordered. He remains medically stable for psych placement. Told the nurse that he is under IVC and can't refuse his levemir. No indication for medical admission currently.       Charlynne PanderYao, Malky Rudzinski Hsienta, MD 12/11/17 1901

## 2017-12-11 NOTE — ED Notes (Signed)
Pt drinking water; began vomiting up water; reports feeling like his blood sugar is high; CBG 564

## 2017-12-11 NOTE — ED Notes (Signed)
Pt eating meal tray 

## 2017-12-11 NOTE — ED Notes (Signed)
Patient vomiting and nauseas this morning, continues to attempt to refuse medications. Attending aware, obtained order for lab testing.

## 2017-12-11 NOTE — ED Notes (Signed)
Pt stating he is going to "freak out and needs medications" pt is feeling anxious. Alerted physician .Ordered ativan and gave trazodone as well

## 2017-12-11 NOTE — ED Notes (Signed)
Pt continues to report high anxiety and difficulty sleeping. Requesting remeron and vistaril to help him sleep (prescriptions for medications found in belongings bag in locker). Pt has been spoken to by MD and previous nursing staff in regards to why he cannot receive medication request (within 8 hours of geodon and multiple QT prolongation drugs). Pt reports understanding at this time, redirected back to room, currently watching TV

## 2017-12-11 NOTE — ED Notes (Signed)
Patient repeatedly getting out of bed stating "I don't feel relaxed. Haldol don't do shit to me. I need geodon"

## 2017-12-11 NOTE — ED Triage Notes (Signed)
PT standing at room door and refusing meds.

## 2017-12-11 NOTE — Progress Notes (Addendum)
Pt. Continues to meets criteria for inpatient treatment per Assunta FoundShuvon Rankin, NP.  Referred out for a second time to the following hospitals:  [facility]     [facility]  [facility]  [facility]  [facility]  [facility]  [facility]  [facility]  [facility]        Disposition CSW will continue to follow for placement  Timmothy EulerJean T. Kaylyn LimSutter, MSW, LCSWA Disposition Clinical Social Work [phone] (cell) [phone] (office)

## 2017-12-11 NOTE — ED Triage Notes (Signed)
PT agitated and demanding diet sodas. Pt was informed of the 500 blood sugars . Pt instructed his blood sugar has been high and he has been in Keto acidossis which is life  threaing.

## 2017-12-11 NOTE — ED Notes (Signed)
Patient sleeping peacefully at this time.

## 2017-12-11 NOTE — ED Notes (Signed)
Meal tray at bedside.  

## 2017-12-11 NOTE — ED Triage Notes (Signed)
Pt refusing Ativan ,[employer] and [facility] security at door . Pt compliant when seeing [employer] at door.

## 2017-12-12 LAB — CBG MONITORING, ED
GLUCOSE-CAPILLARY: 227 mg/dL — AB (ref 65–99)
GLUCOSE-CAPILLARY: 263 mg/dL — AB (ref 65–99)
Glucose-Capillary: 62 mg/dL — ABNORMAL LOW (ref 65–99)
Glucose-Capillary: 292 mg/dL — ABNORMAL HIGH (ref 65–99)
Glucose-Capillary: 320 mg/dL — ABNORMAL HIGH (ref 65–99)

## 2017-12-12 MED ORDER — STERILE WATER FOR INJECTION IJ SOLN
INTRAMUSCULAR | Status: AC
  Administered 2017-12-12: 10 mL
  Filled 2017-12-12: qty 10

## 2017-12-12 MED ORDER — ZIPRASIDONE MESYLATE 20 MG IM SOLR
10.0000 mg | Freq: Once | INTRAMUSCULAR | Status: AC
  Administered 2017-12-12: 10 mg via INTRAMUSCULAR
  Filled 2017-12-12: qty 20

## 2017-12-12 MED ORDER — ZIPRASIDONE HCL 20 MG PO CAPS
20.0000 mg | ORAL_CAPSULE | Freq: Two times a day (BID) | ORAL | Status: DC
  Administered 2017-12-12 – 2017-12-23 (×20): 20 mg via ORAL
  Filled 2017-12-12 (×21): qty 1

## 2017-12-12 MED ORDER — STERILE WATER FOR INJECTION IJ SOLN
INTRAMUSCULAR | Status: AC
  Administered 2017-12-12: 03:00:00
  Filled 2017-12-12: qty 10

## 2017-12-12 NOTE — Progress Notes (Signed)
Patient continues to meet inpatient criteria.  Disposition CSW re-referred to the following:  [facility]  [facility]  [facility]  [facility]  [facility]  [facility]  [facility]  [facility]  [facility]   Disposition CSW will continue to follow for placement.  Timmothy EulerJean T. Kaylyn LimSutter, MSW, LCSWA Disposition Clinical Social Work [phone] (cell) [phone] (office)

## 2017-12-12 NOTE — Progress Notes (Signed)
Inpatient Diabetes Program Recommendations  AACE/ADA: New Consensus Statement on Inpatient Glycemic Control (2015)  Target Ranges:  Prepandial:   less than 140 mg/dL      Peak postprandial:   less than 180 mg/dL (1-2 hours)      Critically ill patients:  140 - 180 mg/dL   Lab Results  Component Value Date   GLUCAP 62 (L) 12/12/2017   HGBA1C 8.7 (H) 09/11/2017   Review of Glycemic Control  Diabetes history: DM Outpatient Diabetes medications: Levemir 38 units, Humalog 12 units tid Current orders for Inpatient glycemic control: Levemir 38 units, Novolog Moderate Correction 0-15 units tid + Novolog HS scale 0-5 units  Inpatient Diabetes Program Recommendations:    Fasting glucose 62 this am. Patient agreed to take Levemir last night. Consider decreasing Levemir dose to 32 units.  Thanks,  Christena DeemShannon Ramiz Turpin RN, MSN, BC-ADM, [facility] Inpatient Diabetes Coordinator Team Pager [phone] (8a-5p)

## 2017-12-12 NOTE — ED Notes (Signed)
CBG 63 - Patient awake and alert. Eating Breakfast at this time

## 2017-12-12 NOTE — ED Notes (Signed)
CBG 292 

## 2017-12-12 NOTE — ED Notes (Signed)
Pt reports he feels more suicidal.

## 2017-12-12 NOTE — ED Notes (Signed)
TTS at the bedside. 

## 2017-12-12 NOTE — ED Notes (Signed)
Pt. Reports feeling very suicidal and felt that he was "going to lose it soon". Medication given. Will continue to monitor.

## 2017-12-12 NOTE — BH Assessment (Signed)
Pt reassessed. Pt reports he still feels suicidal with a plan to overdose if he is discharged. Pt denies HI/AH/VH/SA. Pt reported he didn't sleep or eat well. Pt has very flat affect. Pt continues to meet inpatient criteria.

## 2017-12-12 NOTE — ED Notes (Signed)
MEAL TRAY AT BEDSIDE

## 2017-12-12 NOTE — ED Notes (Signed)
Ordered pt's lunch

## 2017-12-13 ENCOUNTER — Encounter (HOSPITAL_COMMUNITY): Payer: Self-pay | Admitting: Registered Nurse

## 2017-12-13 LAB — CBG MONITORING, ED
GLUCOSE-CAPILLARY: 236 mg/dL — AB (ref 65–99)
GLUCOSE-CAPILLARY: 382 mg/dL — AB (ref 65–99)
GLUCOSE-CAPILLARY: 174 mg/dL — AB (ref 65–99)
GLUCOSE-CAPILLARY: 200 mg/dL — AB (ref 65–99)
Glucose-Capillary: 270 mg/dL — ABNORMAL HIGH (ref 65–99)
Glucose-Capillary: 275 mg/dL — ABNORMAL HIGH (ref 65–99)
Glucose-Capillary: 50 mg/dL — ABNORMAL LOW (ref 65–99)
Glucose-Capillary: 82 mg/dL (ref 65–99)
Glucose-Capillary: 85 mg/dL (ref 65–99)

## 2017-12-13 NOTE — ED Notes (Signed)
Pts CBG 39, pt A&O x4, CBG rechecked with reading 50, pt given orange juice and will recheck

## 2017-12-13 NOTE — ED Notes (Signed)
Carb Modified Diet Ordered for Lunch.

## 2017-12-13 NOTE — Consult Note (Signed)
  Tele Assessment   Mark JackMichael Q Sowell, 32 y.o., male [facility] with complaints of suicidal ideation with plan/intent  Patient seen via telepsych by this provider; chart reviewed and consulted with Dr. Lucianne MussKumar on 12/13/17.  On evaluation Mark [name] reports that he continues to have suicidal ideation and is unable to contract for safety.  "I'm just tired of my entire life.  I just want to kill myself.  I don't care what yall do; I don't have no effort.  I don't have nothing.  My mom died a year ago; I been homeless for a year; I got three sisters but we are not close."  Patient states that when ever he is discharged he would need to find shelter some where. Patient denies psychosis, paranoia, and homicidal ideation.  During evaluation Mark JackMichael Q Mengel is alert/oriented x 4; calm/cooperative with depressed, flat affect.  He does not appear to be responding to internal/external stimuli.  Patient denies homicidal ideation, psychosis, and paranoia; but continues to endorse suicidal ideation with plan/intent; and unable to contract for safety.    Recommendations:  Continue to monitor for stabilization; Continue to look for inpatient psychiatric treatment  Disposition: Recommend psychiatric Inpatient admission when medically cleared.    Dashawn Golda B. Bevin Das, NP

## 2017-12-13 NOTE — Progress Notes (Signed)
[name]  [employer] Auth# [id number]  Timmothy EulerJean T. Kaylyn LimSutter, MSW, LCSWA Disposition Clinical Social Work [phone] (cell) [phone] (office)

## 2017-12-13 NOTE — Progress Notes (Signed)
Inpatient Diabetes Program Recommendations  AACE/ADA: New Consensus Statement on Inpatient Glycemic Control (2015)  Target Ranges:  Prepandial:   less than 140 mg/dL      Peak postprandial:   less than 180 mg/dL (1-2 hours)      Critically ill patients:  140 - 180 mg/dL   Results for Mark [name], [name] (MRN [id number]) as of 12/13/2017 11:56  Ref. Range 12/12/2017 21:48 12/13/2017 01:56 12/13/2017 06:26 12/13/2017 11:45 12/13/2017 11:50  Glucose-Capillary Latest Ref Range: 65 - 99 mg/dL 962263 (H) 952236 (H) 841382 (H) 39 (LL) 50 (L)   Review of Glycemic Control  Diabetes history: DM Outpatient Diabetes medications: Levemir 38 units, Humalog 12 units tid with meals Current orders for Inpatient glycemic control: Levemir 38 units, Novolog Moderate Correction 0-15 units tid, Novolog HS scale 0-5 units  Inpatient Diabetes Program Recommendations:    Patient takes meal coverage at home.  Glucose increases after meals here.  Consider decreasing Correction scale to Novolog Sensitive Correction 0-9 units, add Novolog 4 units tid with meals if patient consumes at least 50% of meals and glucose is at least 80 mg/dl.  Thanks,  Christena DeemShannon Chaley Castellanos RN, MSN, BC-ADM, [facility] Inpatient Diabetes Coordinator Team Pager [phone] (8a-5p)

## 2017-12-13 NOTE — ED Notes (Signed)
Patient was given a Malawiurkey Sandwich to eat, but Refused to Eat it Stated he didn't want it. He wanted to wait til his tray come for Lunch.

## 2017-12-13 NOTE — ED Notes (Signed)
Sitter just came out the room and stated pt is refusing to eat his supper. Insulin was given prior to the patient refusing to eat his meal.

## 2017-12-13 NOTE — ED Notes (Signed)
Patient was given a snack and drink. 

## 2017-12-13 NOTE — ED Notes (Signed)
Patient in shower at this time w/ a Research scientist (physical sciences)Trained sitter.

## 2017-12-13 NOTE — ED Notes (Addendum)
Pt agreeing to eat now d/t somewhat decr'd CBG. Didn't give any novolog insulin, per protocol. Holding ordered levemir until pt is able to eat something.

## 2017-12-14 LAB — CBG MONITORING, ED
Glucose-Capillary: 151 mg/dL — ABNORMAL HIGH (ref 65–99)
Glucose-Capillary: 149 mg/dL — ABNORMAL HIGH (ref 65–99)
Glucose-Capillary: 119 mg/dL — ABNORMAL HIGH (ref 65–99)
Glucose-Capillary: 350 mg/dL — ABNORMAL HIGH (ref 65–99)

## 2017-12-14 MED ORDER — HYDROXYZINE HCL 25 MG PO TABS
25.0000 mg | ORAL_TABLET | Freq: Two times a day (BID) | ORAL | Status: DC
  Administered 2017-12-14 – 2017-12-23 (×17): 25 mg via ORAL
  Filled 2017-12-14 (×17): qty 1

## 2017-12-14 NOTE — ED Notes (Signed)
Pt requesting that when this RN orders breakfast, he is able to add an additional order of eggs and bacon. Explained that food orders are limited to two standard options by current policy. Pt frustrated. "It's not enough!" I offered to get him a sandwich bag as well if the morning's breakfast didn't suit him, but reinforced that we couldn't do what he's asking per policy. Pt replied, "No, that sandwich tastes like ass."   Pt asking to speak with Charge RN regarding same. States "that's not right. I've gotten it before." Pt refusing to discuss with me further. [place]ittany, Consulting civil engineerCharge RN busy at this time. Asked Suzy BouchardKaren N, RN to speak with pt and reinforce. Clydie BraunKaren spoke with pt, who became more agitated. "FUCK this STUPID hospital. This is BULLSHIT. It makes me want to kill myself even more." Pt stating that he wants to leave. Informed pt that he was IVC'd and that leaving was not an option at this time. Pt up to doorway, agitated. Security and [employer] present at this time.

## 2017-12-14 NOTE — ED Notes (Signed)
IVC papers renewed - served - [facility], copy sent to [facility] Records, original placed in folder for Magistrate, and all 3 copies on clipboard.

## 2017-12-14 NOTE — [facility] Counselor (Signed)
Met with pt via telemed for reassessment. Pt reports he continues to have suicidal ideation but denies HI and sx of psychosis. Pt reports he is doing well in ED but he would like more food due to the diabetic meals not making him feel full. Inpatient psychiatric tx continues to be disposition plan.

## 2017-12-14 NOTE — ED Notes (Signed)
Pt requested to speak w/RN. Pt asking for med to "calm me down" and "I need more food. You're not giving me enough". Advised pt RN spoke w/[facility] Counselor who advised will request NP, [facility], to recommend meds and that the food he is being given is for CHO Mod diet. Pt states "then you'll have to call security back here because I'm going to go off. I'm hungry!". Security and Administrator, artsff-Duty Officer in w/pt.

## 2017-12-14 NOTE — ED Notes (Addendum)
Pt initially refusing insulin - stating "I don't want it. I want to die". Advised pt he is under IVC. Pt then agreed for med to be administered. Pt asking for Neurontin - advised pt will be given w/1000 meds.

## 2017-12-14 NOTE — ED Notes (Signed)
Pt noted to be irritable - states he wants to order new lunch tray d/t does not like what he was given. Advised pt that is the only CHO Mod diet option for the day. Pt states "I'm not going to eat it. I'll just starve". Offered pt Malawiturkey sandwich x 2 - refused. Meal tray removed from room. Advised pt he may have it back if he changes his mind. Voiced understanding. Pt then yells when RN leaves room, "This is bullshit!". RN requested for pt to lower his voice and allowed pt to vent feelings/concerns. Offered pt meal tray back or Malawiturkey sandwich - continues to refuse. States "I'm tempted to just get up and run out". Advised pt he is under IVC and that [employer] will bring him back and also that he c/o's bil leg weakness. States "I'm getting my strength back in my legs". Encouraged pt to ambulate to bathroom rather than using urinal - states "I'll try".

## 2017-12-14 NOTE — ED Notes (Signed)
Patient was given 3 cups of Diet Coke w/ Ice and Sugar Free Cookies for Snack and A Carb Modified Diet ordered for Lunch.

## 2017-12-14 NOTE — ED Notes (Signed)
Pt spilled urine on the bed and floor. Clean sheets put on bed and pt walked to bathroom to change scrubs with sitter. Pt in unsteady and dragging feet while ambulating.

## 2017-12-14 NOTE — ED Notes (Signed)
Pt given Atarax for c/o irritable. States "That's no better than Benadryl". Pt took med. Pt then began c/o Sitter - requesting for another Sitter to be assigned to him. Advised pt no as Sitters are assigned by Staffing Office and RN has been observing Best boyitter performing her job well. Pt states "She makes me irritable and I'm going to go off". Allowed pt to vent feelings/concerns. States felt better after discussing w/RN.

## 2017-12-14 NOTE — ED Notes (Signed)
Pt refused for VS's to be taken by International Business MachinesSitter.

## 2017-12-14 NOTE — ED Notes (Signed)
Pt complains that he can't eat the entree from his dinner tray b/c "it's too tough to chew." Agreed to get pt a sandwich meal and diet coke instead.

## 2017-12-14 NOTE — [facility] Counselor (Signed)
Called to arrange telepsych reassessment for pt but he is sleeping at this time. Will retry around dinnertime.

## 2017-12-14 NOTE — ED Notes (Signed)
Pt requesting "something to drink other than water." Reminded pt of guidelines regarding meal and snack times, and that he had just had a diet coke as part of his snack time. Offered to get a cup of ice water. Pt refused.

## 2017-12-14 NOTE — Progress Notes (Signed)
CS[facility].  He did not have referral for this pt.  Referral remade, [facility]. Garner NashGregory Fabien Travelstead, MSW, LCSW Clinical Social Worker 12/14/2017 12:44 PM

## 2017-12-14 NOTE — ED Notes (Signed)
Dr Hyacinth MeekerMiller completed new set of IVC papers - faxed to Magistrate - verified receipt.

## 2017-12-14 NOTE — ED Notes (Signed)
Carb Modified Diet ordered for Dinner. 

## 2017-12-14 NOTE — ED Notes (Signed)
States "I feel even more suicidal today".

## 2017-12-14 NOTE — ED Notes (Signed)
Pt awake now - TTS machine placed in room for Re-TTS to be performed.

## 2017-12-14 NOTE — ED Notes (Signed)
Patient was given a snack and drink. 

## 2017-12-15 LAB — CBG MONITORING, ED
GLUCOSE-CAPILLARY: 86 mg/dL (ref 65–99)
GLUCOSE-CAPILLARY: 100 mg/dL — AB (ref 65–99)
GLUCOSE-CAPILLARY: 513 mg/dL — AB (ref 65–99)
GLUCOSE-CAPILLARY: 497 mg/dL — AB (ref 65–99)
GLUCOSE-CAPILLARY: 256 mg/dL — AB (ref 65–99)

## 2017-12-15 MED ORDER — BACITRACIN ZINC 500 UNIT/GM EX OINT
TOPICAL_OINTMENT | Freq: Two times a day (BID) | CUTANEOUS | Status: DC
  Administered 2017-12-15: 09:00:00 via TOPICAL
  Administered 2017-12-17 – 2017-12-19 (×4): 1 via TOPICAL
  Administered 2017-12-20: 10:00:00 via TOPICAL

## 2017-12-15 MED ORDER — ZIPRASIDONE MESYLATE 20 MG IM SOLR
10.0000 mg | Freq: Once | INTRAMUSCULAR | Status: AC
  Administered 2017-12-15: 10 mg via INTRAMUSCULAR
  Filled 2017-12-15: qty 20

## 2017-12-15 MED ORDER — STERILE WATER FOR INJECTION IJ SOLN
INTRAMUSCULAR | Status: AC
  Administered 2017-12-15: 1.2 mL
  Filled 2017-12-15: qty 10

## 2017-12-15 MED ORDER — MAGNESIUM CITRATE PO SOLN
1.0000 | Freq: Once | ORAL | Status: AC
  Administered 2017-12-15: 1 via ORAL
  Filled 2017-12-15: qty 296

## 2017-12-15 NOTE — ED Notes (Signed)
Pt voided in urinal - spilled some urine on floor - notified staff - apologized to staff. Pt asking for Diet Coke to take w/meds. Advised pt may have water. Pt noted to be calm, cooperative. Pt asked if he may have 3 Diet Coke's w/his dinner - advised pt yes.

## 2017-12-15 NOTE — [facility] Counselor (Signed)
Clinician placed call to re-assess pt; pt was unavailable due to being asleep because he had been given his geodon injection.

## 2017-12-15 NOTE — ED Notes (Addendum)
Assisted pt w/sitting up in bed as requested. Pt appears to be getting sleepy. Pt denies any c/o pain w/right hand - Dr Particia NearingHaviland aware of pt's skin tears noted to knuckles on right hand - advised will assess soon. Pt aware - states "It's OK". States "I'm just tired of being here. I want to leave". Also continues to state he is SI.

## 2017-12-15 NOTE — ED Notes (Signed)
Dinner tray delivered to pt. Pt given Caff-Free Diet Coke x 3 as requested. Pt asking for insulin to be given before he eats. Advised pt will administer when he eats d/t pt states he wants to die - wants blood sugar to either drop or go very high. Pt cursing at staff. Encouraged pt to please display appropriate behavior as he had said he would do this am. States "I'm trying but they're getting in my business" - referring to Goodrich CorporationSitters.

## 2017-12-15 NOTE — ED Notes (Signed)
Pt's CBG 513 - Pt ate 2 packs of graham crackers x 1 hr prior to checking CBG. Will recheck when dinner tray arrives.

## 2017-12-15 NOTE — ED Notes (Addendum)
Pt noted to be yelling, cursing - stating he wanted another breakfast tray. Advised pt unable to do so and offered pt Malawiturkey sandwich. Pt refused. Breakfast tray removed from room d/t pt pushed bedside table across the room. RN asked pt to please lower his voice and as soon as shift report was completed, RN will address his needs if needed. Pt voiced understanding then when RN would leave the room, pt would be begin yelling and cursing "I hate this fucking place!" "Fuck you, you Cunt!" Pt slammed the door to his room. Security standing by and Dr Particia NearingHaviland aware - new order received for Geodon 10mg  IM - given. Pt initially stating he refused. Pt sat on bed as requested by RN and tolerated injection well. Pt allowed RN to obtain his CBG and VS's. Pt stated he would eat Malawiturkey sandwich if RN would place extra Malawiturkey on sandwich. This was given along w/Diet Coke and coffee as requested. Pt had also punched the wall in his room several times w/his right fist. Skin tears noted to knuckles - red - no bleeding noted. Pt able to wiggle his fingers/hand/wrist w/o difficulty. Pt denies any c/o pain.

## 2017-12-15 NOTE — ED Notes (Signed)
Pt stood beside of bed and used urinal rather than ambulating to bathroom. States the urge came too quickly for him to make it to the bathroom. Stated he will try next time.

## 2017-12-15 NOTE — ED Notes (Signed)
Dr Lynelle DoctorKnapp aware of CBG 513 - order received for upon recheck, if continues to be above 400, administer 400 sliding scale dose.

## 2017-12-15 NOTE — ED Notes (Signed)
Refusing vitals

## 2017-12-15 NOTE — ED Notes (Addendum)
Sheet placed over pt's body for comfort.

## 2017-12-15 NOTE — ED Notes (Signed)
Eating snack given - dinner order request received - Malawiturkey burger entree.

## 2017-12-15 NOTE — ED Notes (Addendum)
Dr Particia NearingHaviland assessed pt's hand - noted w/no swelling, redness, drainage. Order received for Bacitracin Ointment to be applied.

## 2017-12-15 NOTE — ED Notes (Signed)
Pt states he would like his night time meds at this time-Monique,RN

## 2017-12-15 NOTE — ED Provider Notes (Signed)
I was called by pt's nurse this morning as he was agitated and screaming.  He was threatening the staff.  I ordered 10 mg geodon that was given.  Pt is now asleep and resting comfortably.  He did hit the wall in his room and sustained abrasions to his right hand.  I looked at his hand and there is no swelling.  Before falling asleep, the nurse noted he had no trouble moving his hand.  I don't think he will need an xray, just some local treatment for the abrasions.   Jacalyn LefevreHaviland, Chelsea Pedretti, MD 12/15/17 [phone]

## 2017-12-15 NOTE — ED Notes (Signed)
Requesting something to help his bowels move.  States I haven't had a bowel movement since I came in here.  Orders received.

## 2017-12-15 NOTE — ED Notes (Signed)
Pt tried to refuse insulin but was advised RN could not let him refuse due to CBG reading; Pt given insulin at time of Geodon shot; pt took shoots with [employer] and security present put was calm and corroborative,RN

## 2017-12-15 NOTE — ED Notes (Signed)
Patient found by sitter to have remote control in room.  Removed by sitter.  Explained that per policy remote could not stay in the room.  When asked to turn it in through remote in room.  Cursing and yelling out.

## 2017-12-15 NOTE — ED Notes (Signed)
Pt has ambulated to bathroom x 3 - states has had 2 BM's and feels better. Pt noted to be slow w/ambulation - standby assistance needed d/t unsteady gait. When pt got up from bed and was slamming his exam room door this am - no difficulty w/ambulation noted.

## 2017-12-15 NOTE — ED Notes (Addendum)
Pt now awake - states feels much better. States "I had to use the urinal because I still feel dizzy from the medicine". States will attempt to ambulate to bathroom w/next urge to void.

## 2017-12-15 NOTE — ED Notes (Signed)
Patient refused to get vitals done by Nurse Tech.

## 2017-12-15 NOTE — ED Notes (Signed)
Patient states "I am really going to commit suicide just to get out of here".

## 2017-12-15 NOTE — ED Notes (Signed)
Pt has been verbally abusive to RN and states he does not want medications due to RN only offering one snack and one drink; Pt threw cookies out the door and was told he will not receive them back drink was removed at well; Pt states several times he does not want to take his night meds; pt continues to yell out to staff "F**K Y**"-Monique,RN

## 2017-12-16 LAB — CBG MONITORING, ED
GLUCOSE-CAPILLARY: 39 mg/dL — AB (ref 65–99)
GLUCOSE-CAPILLARY: 92 mg/dL (ref 65–99)
GLUCOSE-CAPILLARY: 166 mg/dL — AB (ref 65–99)
GLUCOSE-CAPILLARY: 118 mg/dL — AB (ref 65–99)
GLUCOSE-CAPILLARY: 317 mg/dL — AB (ref 65–99)
GLUCOSE-CAPILLARY: 260 mg/dL — AB (ref 65–99)

## 2017-12-16 LAB — BASIC METABOLIC PANEL
ANION GAP: 9 (ref 5–15)
BUN: 9 mg/dL (ref 6–20)
CHLORIDE: 99 mmol/L — AB (ref 101–111)
CO2: 26 mmol/L (ref 22–32)
Calcium: 9.1 mg/dL (ref 8.9–10.3)
Creatinine, Ser: 1.1 mg/dL (ref 0.61–1.24)
GFR calc Af Amer: >60 mL/min (ref >60)
GFR calc non Af Amer: >60 mL/min (ref >60)
Glucose, Bld: 264 mg/dL — ABNORMAL HIGH (ref 65–99)
POTASSIUM: 4.1 mmol/L (ref 3.5–5.1)
Sodium: 134 mmol/L — ABNORMAL LOW (ref 135–145)

## 2017-12-16 MED ORDER — LORAZEPAM 2 MG/ML IJ SOLN
2.0000 mg | Freq: Once | INTRAMUSCULAR | Status: AC
  Administered 2017-12-16: 2 mg via INTRAMUSCULAR
  Filled 2017-12-16: qty 1

## 2017-12-16 MED ORDER — STERILE WATER FOR INJECTION IJ SOLN
INTRAMUSCULAR | Status: AC
  Administered 2017-12-16: 10 mL
  Filled 2017-12-16: qty 10

## 2017-12-16 MED ORDER — ZIPRASIDONE MESYLATE 20 MG IM SOLR
10.0000 mg | Freq: Once | INTRAMUSCULAR | Status: AC
  Administered 2017-12-16: 10 mg via INTRAMUSCULAR
  Filled 2017-12-16: qty 20

## 2017-12-16 NOTE — ED Notes (Addendum)
Though the Falls mat has been at his bedside for several hours, pt suddenly seems annoyed at it's presence. Asks "why is that there?" I explain that after his reported fall earlier today, hospital policy calls for us to put into place several preventative measures to ensure that he doesn't suffer an injury if similar happens again. Pt states "I don't care what the fuck the policy is. That thing's coming up." As pt gets out of bed to ambulate to bathroom, he first folds up the Falls mat and places it in the corner of the room. This RN places it back on the floor as he returns from bathroom. Pt again gets out of bed to pick up mat, fold it up, and place it in the corner. Pt states, "You can put it down, but as soon as you turn the corner, I'm going to pick it right back up." Falls mat placed back on floor at bedside.  Woody, CN notified and Security contacted.

## 2017-12-16 NOTE — Progress Notes (Signed)
Called [facility] to set up tele-assessment this morning for the patient. Was informed by RN that patient had received geodon this morning and was currently sedated. Per report patient has continued to be physically and verbally aggressive with staff. Also per report blood sugar has been very elevated due to frequent requests from the patient for diet coke and coffee with sweetners. At this time will continue to seek inpatient psychiatric treatment due to emotional lability.

## 2017-12-16 NOTE — ED Notes (Signed)
Pt calls out to RN; RN observes pt on the floor on the side of the bed; Sitter states patient sat himself on the floor; pt tells RN that he did not sit himself on the floor but he fail while getting up to turn the TV; Sitter tried to help patient up but patient refused help; RN observed pt standing himself back up and getting back in bed without assistance; vitals rechecked; No obvious signs of injury; Pt continues to refuse to take shower when offered; pt states he is unable to stand therefore can not take shower; Sitter offered to help pt to shower but pt refused-Monique,RN

## 2017-12-16 NOTE — ED Triage Notes (Signed)
Pt had sitter ask for insuline without checking his CBG . Pt shouting from room I need my insuline. SN explained his CBG needed to be checked first. Pt shouted again I need my insuline. Pt agreed to have CBG done before insuline .

## 2017-12-16 NOTE — ED Triage Notes (Signed)
CBG-317 

## 2017-12-16 NOTE — ED Notes (Signed)
Offered pt a pack of graham crackers, sugar-free cookies, and diet coke for snack. Pt asking for two packs of graham crackers. Told pt that he could have one with his diet coke now, and another a little later if his CBG wasn't extremely elevated. Pt frustrated, replied that he only wanted his diet coke now. A few minutes later, pt called out, asking for his graham crackers as well.

## 2017-12-16 NOTE — ED Notes (Signed)
Carb Modified Diet  Ordered for Dinner. 

## 2017-12-16 NOTE — ED Triage Notes (Signed)
Pt awake and at lunch.

## 2017-12-16 NOTE — Progress Notes (Signed)
Disposition CSW contacted [facility] to determine status of referral.  [facility] requested a repeat BMP and three days of patient glucose levels.  Glucose levels faxed and BMP requested from Irwin BrakemanAudrey, M, RN.  Timmothy EulerJean T. Kaylyn LimSutter, MSW, LCSWA Disposition Clinical Social Work [phone] (cell) [phone] (office)

## 2017-12-16 NOTE — ED Triage Notes (Signed)
Pt had a diet coke with lunch and CBG 118 . Pt was brovided with 2nd diet coke and voice understanding he could have another coke till dinner.

## 2017-12-16 NOTE — ED Triage Notes (Signed)
Pt asked for diet coke and one diet coke was given to him.Pt had coffee and milk . Pt then wanted a second cup of coffee with 2 artifical sweeter . Pt standing at door demanding a coffee . Security called . Pt refusing PO meds.

## 2017-12-16 NOTE — ED Triage Notes (Signed)
Pt reported to have been found on floor by previous shift. Fall sign on door Pt has yellow socks and bracelet . Pt refusing fall mat for floor .

## 2017-12-17 LAB — CBG MONITORING, ED
GLUCOSE-CAPILLARY: 123 mg/dL — AB (ref 65–99)
GLUCOSE-CAPILLARY: 125 mg/dL — AB (ref 65–99)
GLUCOSE-CAPILLARY: 211 mg/dL — AB (ref 65–99)
Glucose-Capillary: 249 mg/dL — ABNORMAL HIGH (ref 65–99)

## 2017-12-17 MED ORDER — OLANZAPINE 5 MG PO TBDP
5.0000 mg | ORAL_TABLET | Freq: Two times a day (BID) | ORAL | Status: DC | PRN
  Administered 2017-12-17 – 2017-12-19 (×3): 5 mg via ORAL
  Filled 2017-12-17 (×3): qty 1

## 2017-12-17 NOTE — BH Assessment (Signed)
Pt is calm and cooperative. He says he doesn't [facility] to [facility] RN had correctly told him that sevearal hospitals were declining him due to his aggression in ED. Pt asking if [facility] has televisions in the rooms. He endorses SI. He says he wants to see his deceased mother who died last year from metastatic breast cancer. He denies HI and [facility]. No delusions noted. Pt continues to meet inpatient treatment criteria.   Evette Cristalaroline Paige Durrel Mcnee, [place] Therapeutic Triage Specialist

## 2017-12-17 NOTE — ED Notes (Signed)
Pt has ambulated to bathroom and back to room x 2 today. Pt noted w/shuffling gait - states this is normal for him.

## 2017-12-17 NOTE — Progress Notes (Signed)
Per Selena BattenKim, patient is "[facility]" [facility].  Melbourne Abtsatia Micah Barnier, MSW, LCSWA Clinical social worker in disposition [facility], TTS Office [phone] and [phone] 12/17/2017 6:47 PM

## 2017-12-17 NOTE — Progress Notes (Signed)
CSW spoke with Judeth CornfieldJean, LCS[facility] and informed her that per MD (Dr. Mervyn SkeetersA) disposition could look into [place] taking pt. Carney BernJean confirmed that she would look into [facility] at this time.     Claude MangesKierra S. Sharine Cadle, MSW, LCSW-A Emergency Department Clinical Social Worker [phone]

## 2017-12-17 NOTE — ED Notes (Signed)
Snack given to pt as requested d/t pt was sleeping during snack time.

## 2017-12-17 NOTE — ED Notes (Signed)
Pt noted w/urinal at bedside and fall risk mat on floor beside of bed.

## 2017-12-17 NOTE — ED Notes (Signed)
Pt now eating his dinner

## 2017-12-17 NOTE — ED Notes (Signed)
Caralyn GuileLynssey, AC, [facility] - aware pt has not had re-TTS since 12/15/17 - advised [facility] to perform.

## 2017-12-17 NOTE — ED Notes (Signed)
Re-TTS being performed.  

## 2017-12-17 NOTE — ED Notes (Signed)
Carb modified diet breakfast tray ordered.

## 2017-12-17 NOTE — ED Notes (Signed)
Pt. Ambulated to bathroom. Pt. Had evidence of being wet. Pt. Offered a fresh pair of scrubs and to be cleaned up and pt. Denied offers. Will continue to monitor.

## 2017-12-17 NOTE — ED Notes (Signed)
While pt ambulating back from bathroom, pt looked around the pod and asked, "Man, where's all the sitters at?" Let pt know that we were short-staffed at the moment and didn't have sitters for everyone. Pt replied, "Man, I need to get out of here. What's even going on? Are they even trying to find me a place?" I looked through chart notes and communicated several of the facilities that [facility], waiting to hear back. Explained that this was one of the reasons we've been so concerned about getting his CBG levels under control, since many facilities wouldn't accept a pt whose CBG is dangerously uncontrolled and potentially causing more serious medical issues. Pt noted understanding and thanked Charity fundraiserN for sharing information.

## 2017-12-17 NOTE — ED Notes (Signed)
Pt states he is trying to keep himself calm w/tv but feels needs Geodon dose increased. Pt aware Zyprexa ordered - given.

## 2017-12-17 NOTE — ED Notes (Signed)
Relieved sitter from duties. Pt in bed watching tv.  Area all secure.

## 2017-12-17 NOTE — ED Notes (Signed)
Pt just finished his dinner tray and now eating "his 9 o clock snack"

## 2017-12-17 NOTE — ED Notes (Signed)
Cat[facility], [facility], [facility] to perform re-TTS.

## 2017-12-17 NOTE — ED Notes (Signed)
Sitter advised pt asked to speak w/RN. Prior to RN entering room, pt noted to be cursing - stating he was tired of waiting. When RN spoke w/pt - asking [facility]. States no one spoke w/him from there yesterday. States he wants to know how much longer he will be in ED. Advised [facility]. Pt stated [facility]. Advised pt d/t the behavior he has been displaying, other facilities will decline taking him. Pt began cursing and talking loudly. Bedside table removed from room for safety. RN advised pt if he displays appropriate behavior, this is documented and may assist him w/being accepted to there facilities. If he chooses to continue to be verbally abusive w/staff, he may be in ED longer [facility]. Pt stated he "will be good" and asked for bedside table to be brought back into room - RN placed table at bedside and advised pt will let him know when [facility] is ready to perform his re-TTS. Voiced appreciation and understanding.

## 2017-12-18 ENCOUNTER — Encounter (HOSPITAL_COMMUNITY): Payer: Self-pay | Admitting: Registered Nurse

## 2017-12-18 DIAGNOSIS — F17210 Nicotine dependence, cigarettes, uncomplicated: Secondary | ICD-10-CM

## 2017-12-18 DIAGNOSIS — R45851 Suicidal ideations: Secondary | ICD-10-CM | POA: Insufficient documentation

## 2017-12-18 DIAGNOSIS — F1290 Cannabis use, unspecified, uncomplicated: Secondary | ICD-10-CM

## 2017-12-18 DIAGNOSIS — F314 Bipolar disorder, current episode depressed, severe, without psychotic features: Secondary | ICD-10-CM | POA: Diagnosis present

## 2017-12-18 LAB — CBG MONITORING, ED
GLUCOSE-CAPILLARY: 147 mg/dL — AB (ref 65–99)
Glucose-Capillary: 154 mg/dL — ABNORMAL HIGH (ref 65–99)
Glucose-Capillary: 292 mg/dL — ABNORMAL HIGH (ref 65–99)
Glucose-Capillary: 399 mg/dL — ABNORMAL HIGH (ref 65–99)

## 2017-12-18 NOTE — ED Notes (Signed)
Patient back to room with sitter.

## 2017-12-18 NOTE — ED Notes (Signed)
Patient has been very pleasant tonight; Pt has taken all medications and has not made any unnecessary request from staff-Monique,RN

## 2017-12-18 NOTE — ED Notes (Signed)
Patient up for shower with sitter.

## 2017-12-18 NOTE — ED Notes (Signed)
[facility] request repeat BMP to be faxed to [phone] Atten: Romanie; admission still pending until results have been recieved-Monique,RN

## 2017-12-18 NOTE — Consult Note (Addendum)
Telepsych Consultation   Reason for Consult:  Suicidal ideation Referring Physician:  Davonna Belling, MD Location of Patient: [facility] Location of Provider: [facility]  Patient Identification: Mark [name] MRN:  [id number] Principal Diagnosis: Major depressive disorder, recurrent severe without psychotic features [facility]) Diagnosis:   Patient Active Problem List   Diagnosis Date Noted  . Suicidal ideation [R45.851]   . Major depressive disorder, recurrent severe without psychotic features ([place]) [F33.2]   . Adjustment disorder with depressed mood [F43.21] 12/07/2017  . Noncompliance [Z91.19] 10/17/2017  . Dysthymia [F34.1] 10/08/2017  . Personality disorder ([place]) [F60.9] 09/26/2017  . 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 Obstructive hydrocephalus [G91.1] 09/19/2017  . Diabetes ([place]) [E11.9] 09/11/2017  . Hypoglycemia [E16.2] 09/09/2017  . MDD (major depressive disorder) [F32.9] 08/22/2017  . Severe recurrent major depression with psychotic features ([place]) [F33.3] 08/19/2017  . Nausea & vomiting [R11.2] 08/18/2017  . Abdominal pain [R10.9] 08/18/2017  . Hyponatremia [E87.1] 08/18/2017  . DKA (diabetic ketoacidoses) ([place]) [E13.10] 11/24/2015  . Diabetic keto-acidosis ([place]) [E13.10] 10/23/2015    Total Time spent with patient: 45 minutes    Per previous note by this provider Subjective:   Mark [name] is a 32 y.o. male patient [facility] with complaints of suicidal ideation with plan/intent  On previous telepsych assessment: 12/13/17 Complaints of suicidal ideation with plan and unable to contract for safety.  Patient informed mother passed a year ago; has other family but was close to no one and had no support system.  Patient also homeless and nowhere to go.    HPI:  Mark [name], 32 y.o., male patient reassessed via telepsych by this provider; chart reviewed and consulted with Dr. Parke Poisson on 12/18/17.  On evaluation Mark [name] reports  "I'm still suicidal.  If you discharge me I'm going to kill myself."  Patient states that he does not feel safe to go home.  "I don't feel like I've had enough treatment.  There is no time on when I will feel better."  Patient denies homicidal ideation, psychosis, and paranoia.  Patient states that he is eating without difficulty but not sleep well.  "The Trazodone is not helping."       Past Psychiatric History: MDD  Risk to Self: Suicidal Ideation: Yes-Currently Present Suicidal Intent: Yes-Currently Present Is patient at risk for suicide?: No Suicidal Plan?: Yes-Currently Present Specify Current Suicidal Plan: stop taking insulin; walk into traffic Access to Means: Yes Specify Access to Suicidal Means: insulin What has been your use of drugs/alcohol within the last 12 months?: Marijuana How many times?: (4) Triggers for Past Attempts: Unknown Intentional Self Injurious Behavior: Cutting Comment - Self Injurious Behavior: Pt stated that he cut his wrists on 12/07/17 Risk to Others: Homicidal Ideation: No Thoughts of Harm to Others: No Current Homicidal Intent: No Current Homicidal Plan: No Access to Homicidal Means: No History of harm to others?: No Assessment of Violence: None Noted Does patient have access to weapons?: No Criminal Charges Pending?: Yes Describe Pending Criminal Charges: Possession Does patient have a court date: Yes Court Date: 01/03/18 Prior Inpatient Therapy: Prior Inpatient Therapy: Yes Prior Therapy Dates: 2018,2017 Prior Therapy Facilty/Provider(s): [place], [facility] Reason for Treatment: MH isssues Prior Outpatient Therapy: Prior Outpatient Therapy: No Prior Therapy Dates: NA Prior Therapy Facilty/Provider(s): NA Reason for Treatment: NA Does patient have an ACCT team?: No Does patient have Intensive In-House Services?  : No Does patient have [facility] services? : No Does patient have P4CC services?:  No  Past Medical History:  Past Medical History:   Diagnosis Date  . Diabetes mellitus without complication ([place])   . Hydrocephalus   . Kidney stones     Past Surgical History:  Procedure Laterality Date  . KIDNEY STONE SURGERY     Family History:  Family History  Problem Relation Age of Onset  . Breast cancer Mother    Family Psychiatric  History: Unaware Social History:  Social History   Substance and Sexual Activity  Alcohol Use No   Comment: occassional      Social History   Substance and Sexual Activity  Drug Use Yes  . Types: Marijuana   Comment: ''I haven't used in over a month"    Social History   Socioeconomic History  . Marital status: Single    Spouse name: None  . Number of children: None  . Years of education: None  . Highest education level: None  Social Needs  . Financial resource strain: None  . Food insecurity - worry: None  . Food insecurity - inability: None  . Transportation needs - medical: None  . Transportation needs - non-medical: None  Occupational History  . None  Tobacco Use  . Smoking status: Current Every Day Smoker    Packs/day: 1.00    Types: Cigarettes  . Smokeless tobacco: Never Used  Substance and Sexual Activity  . Alcohol use: No    Comment: occassional   . Drug use: Yes    Types: Marijuana    Comment: ''I haven't used in over a month"  . Sexual activity: No  Other Topics Concern  . None  Social History Narrative  . None   Additional Social History:    Allergies:   Allergies  Allergen Reactions  . Mushroom Extract Complex Hives, Itching and Nausea And Vomiting  . Penicillins Anaphylaxis, Hives and Swelling    Has patient had a PCN reaction causing immediate rash, facial/tongue/throat swelling, SOB or lightheadedness with hypotension: Yes Has patient had a PCN reaction causing severe rash involving mucus membranes or skin necrosis: No Has patient had a PCN reaction that required hospitalization Yes Has patient had a PCN reaction occurring within the last 10  years: Yes If all of the above answers are "NO", then may proceed with Cephalosporin use.  . Sulfa Antibiotics Anaphylaxis and Hives  . Clindamycin/Lincomycin Hives    Labs:  Results for orders placed or performed during the hospital encounter of 12/07/17 (from the past 48 hour(s))  Basic metabolic panel     Status: Abnormal   Collection Time: 12/16/17  4:12 PM  Result Value Ref Range   Sodium 134 (L) 135 - 145 mmol/L   Potassium 4.1 3.5 - 5.1 mmol/L   Chloride 99 (L) 101 - 111 mmol/L   CO2 26 22 - 32 mmol/L   Glucose, Bld 264 (H) 65 - 99 mg/dL   BUN 9 6 - 20 mg/dL   Creatinine, Ser 1.10 0.61 - 1.24 mg/dL   Calcium 9.1 8.9 - 10.3 mg/dL   GFR calc non Af Amer >60 >60 mL/min   GFR calc Af Amer >60 >60 mL/min    Comment: (NOTE) The eGFR has been calculated using the CKD EPI equation. This calculation has not been validated in all clinical situations. eGFR's persistently <60 mL/min signify possible Chronic Kidney Disease.    Anion gap 9 5 - 15    Comment: Performed at [facility] [address], [place], [place] [address]  CBG  monitoring, ED     Status: Abnormal   Collection Time: 12/16/17  5:59 PM  Result Value Ref Range   Glucose-Capillary 317 (H) 65 - 99 mg/dL  CBG monitoring, ED     Status: Abnormal   Collection Time: 12/16/17 10:25 PM  Result Value Ref Range   Glucose-Capillary 260 (H) 65 - 99 mg/dL  CBG monitoring, ED     Status: Abnormal   Collection Time: 12/17/17  7:11 AM  Result Value Ref Range   Glucose-Capillary 123 (H) 65 - 99 mg/dL  CBG monitoring, ED     Status: Abnormal   Collection Time: 12/17/17 11:36 AM  Result Value Ref Range   Glucose-Capillary 125 (H) 65 - 99 mg/dL  CBG monitoring, ED     Status: Abnormal   Collection Time: 12/17/17  4:04 PM  Result Value Ref Range   Glucose-Capillary 249 (H) 65 - 99 mg/dL  CBG monitoring, ED     Status: Abnormal   Collection Time: 12/17/17  8:26 PM  Result Value Ref Range   Glucose-Capillary 211 (H) 65 -  99 mg/dL  CBG monitoring, ED     Status: Abnormal   Collection Time: 12/18/17  7:21 AM  Result Value Ref Range   Glucose-Capillary 154 (H) 65 - 99 mg/dL  CBG monitoring, ED     Status: Abnormal   Collection Time: 12/18/17 11:48 AM  Result Value Ref Range   Glucose-Capillary 147 (H) 65 - 99 mg/dL    Medications:  Current Facility-Administered Medications  Medication Dose Route Frequency Provider Last Rate Last Dose  . bacitracin ointment   Topical BID Isla Pence, MD   1 application at 16/10/96 2114  . gabapentin (NEURONTIN) capsule 1,200 mg  1,200 mg Oral TID Larene Pickett, PA-C   1,200 mg at 12/18/17 0454  . hydrOXYzine (ATARAX/VISTARIL) tablet 25 mg  25 mg Oral BID Okonkwo, Justina A, NP   25 mg at 12/18/17 0921  . insulin aspart (novoLOG) injection 0-15 Units  0-15 Units Subcutaneous TID WC Cardama, Grayce Sessions, MD   2 Units at 12/18/17 1203  . insulin aspart (novoLOG) injection 0-5 Units  0-5 Units Subcutaneous QHS Fatima Blank, MD   2 Units at 12/17/17 2114  . insulin detemir (LEVEMIR) injection 38 Units  38 Units Subcutaneous QHS Fatima Blank, MD   38 Units at 12/17/17 2115  . nicotine (NICODERM CQ - dosed in mg/24 hours) patch 21 mg  21 mg Transdermal Daily Cardama, Grayce Sessions, MD   21 mg at 12/18/17 0920  . OLANZapine zydis (ZYPREXA) disintegrating tablet 5 mg  5 mg Oral BID PRN Okonkwo, Justina A, NP   5 mg at 12/18/17 1055  . traZODone (DESYREL) tablet 100 mg  100 mg Oral QHS PRN Larene Pickett, PA-C   100 mg at 12/17/17 2114  . venlafaxine XR (EFFEXOR-XR) 24 hr capsule 300 mg  300 mg Oral Q breakfast Larene Pickett, PA-C   300 mg at 12/18/17 0757  . ziprasidone (GEODON) capsule 20 mg  20 mg Oral BID WC Pattricia Boss, MD   20 mg at 12/18/17 0981   Current Outpatient Medications  Medication Sig Dispense Refill  . diphenhydramine-acetaminophen (TYLENOL PM) 25-500 MG TABS tablet Take 2 tablets by mouth at bedtime as needed (for sleep).    .  gabapentin (NEURONTIN) 400 MG capsule Take 3 capsules (1,200 mg total) by mouth 3 (three) times daily. 270 capsule 1  . HUMALOG 100 UNIT/ML injection Inject 12 Units  into the skin 3 (three) times daily with meals.  0  . LEVEMIR 100 UNIT/ML injection Inject 38 Units into the skin at bedtime.  0  . traZODone (DESYREL) 100 MG tablet Take 1 tablet (100 mg total) by mouth at bedtime as needed for sleep. 30 tablet 2  . venlafaxine XR (EFFEXOR-XR) 150 MG 24 hr capsule Take 2 capsules (300 mg total) by mouth daily with breakfast. 60 capsule 1  . blood glucose meter kit and supplies KIT Dispense based on patient and insurance preference. Use up to four times daily as directed. (FOR ICD-9 250.00, 250.01). (Patient not taking: Reported on 12/06/2017) 1 each 0  . haloperidol decanoate (HALDOL DECANOATE) 100 MG/ML injection Inject 1 mL (100 mg total) into the muscle every 28 (twenty-eight) days. (Patient not taking: Reported on 12/06/2017) 1 mL 1  . insulin aspart (NOVOLOG) 100 UNIT/ML FlexPen Inject 12 Units into the skin 3 (three) times daily before meals.      Musculoskeletal: Strength & Muscle Tone: within normal limits Gait & Station: normal Patient leans: N/A  Psychiatric Specialty Exam: Physical Exam  ROS  Blood pressure 117/72, pulse (!) 54, temperature 98.4 F (36.9 C), temperature source Oral, resp. rate 18, SpO2 99 %.There is no height or weight on file to calculate BMI.  General Appearance: Casual  Eye Contact:  Good  Speech:  Clear and Coherent and Normal Rate  Volume:  Normal  Mood:  Depressed  Affect:  Depressed  Thought Process:  Coherent and Goal Directed  Orientation:  Full (Time, Place, and Person)  Thought Content:  Denies hallucinations, delusions, and paranoia  Suicidal Thoughts:  Yes.  with intent/plan  Still not contracting for safety and states that he will kill himself if he is discharged  Homicidal Thoughts:  No  Memory:  Immediate;   Good Recent;   Good Remote;   Good   Judgement:  Poor  Insight:  Lacking and Shallow  Psychomotor Activity:  Normal  Concentration:  Concentration: Fair and Attention Span: Fair  Recall:  Good  Fund of Knowledge:  Fair  Language:  Good  Akathisia:  No  Handed:  Right  AIMS (if indicated):     Assets:  Communication Skills Desire for Improvement  ADL's:  Intact  Cognition:  WNL  Sleep:        Treatment Plan Summary: Daily contact with patient to assess and evaluate symptoms and progress in treatment, Medication management and Plan Inpatient psychiatric treatment  Disposition: Recommend psychiatric Inpatient admission when medically cleared.  This service was provided via telemedicine using a 2-way, interactive audio and video technology.  Names of all persons participating in this telemedicine service and their role in this encounter. Name: Earleen Newport NP Role: Telepsych  Name: Dr Parke Poisson Role: Psychiatrist  Name: Glendale Chard Role: Patient   Name:  Role:     Earleen Newport, NP 12/18/2017 3:02 PM  Agree with NP Assessment

## 2017-12-18 NOTE — Progress Notes (Incomplete)
Per Romani, the doctor is requesting the last 3 days of Bood sugar and a repeat test for the BMP.  [facility] RN to be informed.

## 2017-12-19 LAB — BASIC METABOLIC PANEL
Anion gap: 11 (ref 5–15)
BUN: 10 mg/dL (ref 6–20)
CHLORIDE: 100 mmol/L — AB (ref 101–111)
CO2: 23 mmol/L (ref 22–32)
Calcium: 9.1 mg/dL (ref 8.9–10.3)
Creatinine, Ser: 1.1 mg/dL (ref 0.61–1.24)
GFR calc non Af Amer: >60 mL/min (ref >60)
Glucose, Bld: 421 mg/dL — ABNORMAL HIGH (ref 65–99)
POTASSIUM: 4.7 mmol/L (ref 3.5–5.1)
SODIUM: 134 mmol/L — AB (ref 135–145)

## 2017-12-19 LAB — CBG MONITORING, ED
GLUCOSE-CAPILLARY: 117 mg/dL — AB (ref 65–99)
GLUCOSE-CAPILLARY: 284 mg/dL — AB (ref 65–99)
GLUCOSE-CAPILLARY: 336 mg/dL — AB (ref 65–99)
Glucose-Capillary: 370 mg/dL — ABNORMAL HIGH (ref 65–99)

## 2017-12-19 MED ORDER — LORAZEPAM 2 MG/ML IJ SOLN
1.0000 mg | Freq: Four times a day (QID) | INTRAMUSCULAR | Status: DC | PRN
  Administered 2017-12-22 (×2): 1 mg via INTRAMUSCULAR
  Filled 2017-12-19 (×2): qty 1

## 2017-12-19 MED ORDER — ZIPRASIDONE MESYLATE 20 MG IM SOLR: 20.0000 mg | Freq: Four times a day (QID) | INTRAMUSCULAR | Status: DC | PRN

## 2017-12-19 NOTE — ED Notes (Signed)
TTS being done 

## 2017-12-19 NOTE — ED Notes (Signed)
Patient has been pleasant with staff all night; Pt expresses concern about placement; Pt states [facility] if he had a choice and wanted his wishes document in chart; pt still refuses to take daily shower-Monique,RN

## 2017-12-19 NOTE — ED Notes (Signed)
Faxed updated BMP and last 3 days of CBG [name] upon request for further evaluation of placement; [facility] at this time-Monique,RN

## 2017-12-19 NOTE — ED Notes (Signed)
Pt's dinner tray arrived. 

## 2017-12-19 NOTE — ED Notes (Signed)
Last days  CBG and last BMET [facility] , refaxed

## 2017-12-19 NOTE — ED Notes (Signed)
gOT CONFIRMATION that [facility] got fax of BMET and CBGs

## 2017-12-19 NOTE — ED Triage Notes (Signed)
Pt's meal arrived and [facility] was not enough food on the plate to fill him up. Pt ask and was given a diet coke with his dinner. CBG 370 at dinner.

## 2017-12-19 NOTE — ED Notes (Signed)
At this time, pt redirectable, provided w/ 2 packs of graham crackers as he did not eat provided dinner. Will hold prn meds at this time and admin only as needed. Pt resting quietly in bed and watching TV

## 2017-12-19 NOTE — [facility] Counselor (Signed)
Pt reports SI with no plan but states he will kill himself if D/C. Pt states he has been suicidal for a year. Pt denies SI attempts.  Pt denies HI/AVH. Pt reports SA.  Shuvon, NP continues to recommends inpatient treatment.  Wolfgang PhoenixBrandi Mikenzie Mccannon, [facility] Triage Specialist

## 2017-12-19 NOTE — ED Notes (Signed)
Currently waiting for [facility] to review and confirm placement for patient.

## 2017-12-19 NOTE — ED Notes (Signed)
Pt refusing to eat provided meal tray at this time due to no fork. Explained to pt why we could not provide a fork as protocol, offered second spoon, pt refused. Pt dumped beverage on floor and stated "this hospital is a fucking joke" and called RN multiple names and escalated. Pt standing at nurses station, instructed pt to return to room. Plan to obtain order for prn meds for agitation.

## 2017-12-19 NOTE — ED Notes (Signed)
Carb modified dinner tray ordered 

## 2017-12-19 NOTE — ED Notes (Signed)
Pt wanted to close bathroom door. Nurse Tech (NT) told pt that she would close the door. Pt wanted to close the door himself and became agitated, when the NT explained that for his safety that she cannot close the door completely. Pt stated that he did not like the NT and asked for another NT. Pt stated that if he did not get another Tech, "there would be problems". Pt informed that the RN would be back in a few minutes and would be informed. Pt went to the bathroom without incident.

## 2017-12-20 LAB — CBG MONITORING, ED
GLUCOSE-CAPILLARY: 219 mg/dL — AB (ref 65–99)
Glucose-Capillary: 221 mg/dL — ABNORMAL HIGH (ref 65–99)
Glucose-Capillary: 312 mg/dL — ABNORMAL HIGH (ref 65–99)
Glucose-Capillary: 112 mg/dL — ABNORMAL HIGH (ref 65–99)
Glucose-Capillary: 274 mg/dL — ABNORMAL HIGH (ref 65–99)

## 2017-12-20 NOTE — [facility] Counselor (Signed)
Patient has been referred for a psychiatric consult with Dr. Sharma CovertNorman.

## 2017-12-20 NOTE — ED Notes (Signed)
Pt given snack and diet coke.

## 2017-12-20 NOTE — ED Notes (Signed)
Carb Modified Diet has been ordered for Mark LeeDinner.

## 2017-12-20 NOTE — ED Notes (Signed)
Pt provided meal Sitter at bedside Pt immediately endorsed SI upon my entry to room

## 2017-12-20 NOTE — ED Triage Notes (Signed)
Pt ambulated to bathroom without assistance 

## 2017-12-20 NOTE — Progress Notes (Addendum)
Inpatient Diabetes Program Recommendations  AACE/ADA: New Consensus Statement on Inpatient Glycemic Control (2015)  Target Ranges:  Prepandial:   less than 140 mg/dL      Peak postprandial:   less than 180 mg/dL (1-2 hours)      Critically ill patients:  140 - 180 mg/dL   Results for Derek JackWILSON, Kevonte Q (MRN [id number]) as of 12/20/2017 10:25  Ref. Range 12/19/2017 07:25 12/19/2017 11:47 12/19/2017 18:15 12/19/2017 21:05 12/20/2017 06:10 12/20/2017 08:00  Glucose-Capillary Latest Ref Range: 65 - 99 mg/dL 782117 (H) 956284 (H) 213370 (H) 336 (H) 221 (H) 312 (H)   Review of Glycemic Control  Diabetes history: DM Outpatient Diabetes medications: Levemir 38 units, Humalog 12 units tid with meals Current orders for Inpatient glycemic control: Levemir 38 units, Novolog Moderate Correction 0-15 units tid, Novolog HS scale 0-5 units  Inpatient Diabetes Program Recommendations:    Patient takes meal coverage at home.  Glucose increases into the 300's after meals here.  Consider adding Novolog 4 units tid with meals if patient consumes at least 50% of meals and glucose is at least 80 mg/dl.  Thanks,  Christena DeemShannon Anniemae Haberkorn RN, MSN, BC-ADM, [facility] Inpatient Diabetes Coordinator Team Pager [phone] (8a-5p)

## 2017-12-20 NOTE — ED Notes (Signed)
Carb Modified Diet ordered for Dinner. 

## 2017-12-21 DIAGNOSIS — E1140 Type 2 diabetes mellitus with diabetic neuropathy, unspecified: Secondary | ICD-10-CM

## 2017-12-21 LAB — CBG MONITORING, ED
GLUCOSE-CAPILLARY: 318 mg/dL — AB (ref 65–99)
Glucose-Capillary: 105 mg/dL — ABNORMAL HIGH (ref 65–99)
Glucose-Capillary: 246 mg/dL — ABNORMAL HIGH (ref 65–99)
Glucose-Capillary: 314 mg/dL — ABNORMAL HIGH (ref 65–99)

## 2017-12-21 NOTE — ED Notes (Signed)
Sitting up in chair in room watching tv.

## 2017-12-21 NOTE — ED Notes (Signed)
Patient sleeping at this time, A snack will be given when patient wake up.

## 2017-12-21 NOTE — ED Notes (Addendum)
Dr Elsie SaasJonnalagadda in w/pt - order received to d/c Zyprexa d/t pt is diabetic.

## 2017-12-21 NOTE — ED Notes (Signed)
Pt ambulated to nurses' desk asking for Diet Coke. Advised will give him one d/t dinner tray has arrived. Voiced understanding.

## 2017-12-21 NOTE — ED Notes (Signed)
Pt asking if he may be given bottle of insulin prior to being d/c'd tomorrow or Mon. Advised pt will ask Case Manager in am.

## 2017-12-21 NOTE — ED Notes (Signed)
Pt continues to state he is in agreement w[facility] if [facility] or [facility] if approved on Mon (12/23/17).

## 2017-12-21 NOTE — ED Notes (Signed)
Patient cbg was 314, Nurse Kriste BasqueBecky was informed.

## 2017-12-21 NOTE — ED Notes (Signed)
CBG 318 

## 2017-12-21 NOTE — Consult Note (Signed)
[facility] Consult   Reason for Consult:  Depression,suicidal ideation and homelss Referring Physician:  Dr. Lockie Pares Patient Identification: Mark [name] MRN:  [id number] Principal Diagnosis: Major depressive disorder, recurrent severe without psychotic features [facility]) Diagnosis:   Patient Active Problem List   Diagnosis Date Noted  . Suicidal ideation [R45.851]   . Major depressive disorder, recurrent severe without psychotic features ([place]) [F33.2]   . Adjustment disorder with depressed mood [F43.21] 12/07/2017  . Noncompliance [Z91.19] 10/17/2017  . Dysthymia [F34.1] 10/08/2017  . Personality disorder ([place]) [F60.9] 09/26/2017  . 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 Obstructive hydrocephalus [G91.1] 09/19/2017  . Diabetes ([place]) [E11.9] 09/11/2017  . Hypoglycemia [E16.2] 09/09/2017  . MDD (major depressive disorder) [F32.9] 08/22/2017  . Severe recurrent major depression with psychotic features ([place]) [F33.3] 08/19/2017  . Nausea & vomiting [R11.2] 08/18/2017  . Abdominal pain [R10.9] 08/18/2017  . Hyponatremia [E87.1] 08/18/2017  . DKA (diabetic ketoacidoses) ([place]) [E13.10] 11/24/2015  . Diabetic keto-acidosis ([place]) [E13.10] 10/23/2015    Total Time spent with patient: 1 hour  Subjective:   Mark [name] is a 32 y.o. male patient admitted with depression and suicide ideation.  HPI:  Patient seen, chart reviewed and case discussed with staff RN. Patient appeared calm and cooperative and stayed in his bed and able to walk to the bathroom without assistance. He has ADL's independent. He is awake, alert, oriented x 3. He has been compliant with his medications, able to eat all his meals and sleeping fine. He has occasional frustation and tantrums with health techs. Patient is homeless with depression, diabetic neuropathy and on and off in homeless shelters, stays on streets when weather is warm. 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 He has no reported emotional or behavioral problems over 48 hours. He understood that hospital can not keep him longer than his acute medical or psych needs. Upon chart review in Care Everywhere it is noted that Pt has been going from hospital to hospital. Pt has secondary gain in the manner of a place to sleep and food. This provider and staff RN agree that he has been malingering emotional problems for secondary gains while being in emergency department.     Past Psychiatric History: Reportedly he was in [facility], [facility] in the last 45 days and [facility] hospitals over two years ago.  Risk to Self: None Risk to Others: None Prior Inpatient Therapy: Prior Inpatient Therapy: Yes Prior Therapy Dates: 2018,2017 Prior Therapy Facilty/Provider(s): [facility], [facility] Reason for Treatment: MH isssues Prior Outpatient Therapy: Prior Outpatient Therapy: No Prior Therapy Dates: NA Prior Therapy Facilty/Provider(s): NA Reason for Treatment: NA Does patient have an ACCT team?: No Does patient have Intensive In-House Services?  : No Does patient have [facility] services? : No Does patient have P4CC services?: No  Past Medical History:  Past Medical History:  Diagnosis Date  . Diabetes mellitus without complication ([place])   . Hydrocephalus   . Kidney stones     Past Surgical History:  Procedure Laterality Date  . KIDNEY STONE SURGERY     Family History:  Family History  Problem Relation Age of Onset  . Breast cancer Mother    Family Psychiatric  History: His mother was deceased reportedly cancer and died in hospice care and he lost the apartment due to not able to pay about two years ago.  Social History:  Social History   Substance and Sexual Activity  Alcohol Use No   Comment: occassional      Social History   Substance and Sexual Activity  Drug Use Yes  . Types: Marijuana   Comment: ''I haven't used in over a month"    Social  History   Socioeconomic History  . Marital status: Single    Spouse name: None  . Number of children: None  . Years of education: None  . Highest education level: None  Social Needs  . Financial resource strain: None  . Food insecurity - worry: None  . Food insecurity - inability: None  . Transportation needs - medical: None  . Transportation needs - non-medical: None  Occupational History  . None  Tobacco Use  . Smoking status: Current Every Day Smoker    Packs/day: 1.00    Types: Cigarettes  . Smokeless tobacco: Never Used  Substance and Sexual Activity  . Alcohol use: No    Comment: occassional   . Drug use: Yes    Types: Marijuana    Comment: ''I haven't used in over a month"  . Sexual activity: No  Other Topics Concern  . None  Social History Narrative  . None   Additional Social History:    Allergies:   Allergies  Allergen Reactions  . Mushroom Extract Complex Hives, Itching and Nausea And Vomiting  . Penicillins Anaphylaxis, Hives and Swelling    Has patient had a PCN reaction causing immediate rash, facial/tongue/throat swelling, SOB or lightheadedness with hypotension: Yes Has patient had a PCN reaction causing severe rash involving mucus membranes or skin necrosis: No Has patient had a PCN reaction that required hospitalization Yes Has patient had a PCN reaction occurring within the last 10 years: Yes If all of the above answers are "NO", then may proceed with Cephalosporin use.  . Sulfa Antibiotics Anaphylaxis and Hives  . Clindamycin/Lincomycin Hives    Labs:  Results for orders placed or performed during the hospital encounter of 12/07/17 (from the past 48 hour(s))  CBG monitoring, ED     Status: Abnormal   Collection Time: 12/19/17  6:15 PM  Result Value Ref Range   Glucose-Capillary 370 (H) 65 - 99 mg/dL  CBG monitoring, ED     Status: Abnormal   Collection Time: 12/19/17  9:05 PM  Result Value Ref Range   Glucose-Capillary 336 (H) 65 - 99  mg/dL  CBG monitoring, ED     Status: Abnormal   Collection Time: 12/20/17  6:10 AM  Result Value Ref Range   Glucose-Capillary 221 (H) 65 - 99 mg/dL   Comment 1 Notify RN   CBG monitoring, ED     Status: Abnormal   Collection Time: 12/20/17  8:00 AM  Result Value Ref Range   Glucose-Capillary 312 (H) 65 - 99 mg/dL  CBG monitoring, ED     Status: Abnormal   Collection Time: 12/20/17 12:06 PM  Result Value Ref Range   Glucose-Capillary 112 (H) 65 - 99 mg/dL   Comment 1 Notify RN    Comment 2 Document in Chart   CBG monitoring, ED     Status: Abnormal   Collection Time: 12/20/17  5:52 PM  Result Value Ref Range   Glucose-Capillary 274 (H) 65 - 99 mg/dL   Comment 1 Notify RN    Comment 2 Document in Chart   CBG monitoring, ED     Status: Abnormal   Collection Time: 12/20/17 10:09 PM  Result Value  Ref Range   Glucose-Capillary 219 (H) 65 - 99 mg/dL  CBG monitoring, ED     Status: Abnormal   Collection Time: 12/21/17  6:16 AM  Result Value Ref Range   Glucose-Capillary 105 (H) 65 - 99 mg/dL    Current Facility-Administered Medications  Medication Dose Route Frequency Provider Last Rate Last Dose  . gabapentin (NEURONTIN) capsule 1,200 mg  1,200 mg Oral TID Larene Pickett, PA-C   1,200 mg at 12/21/17 1028  . hydrOXYzine (ATARAX/VISTARIL) tablet 25 mg  25 mg Oral BID Okonkwo, Justina A, NP   25 mg at 12/21/17 1028  . insulin aspart (novoLOG) injection 0-15 Units  0-15 Units Subcutaneous TID WC Cardama, Grayce Sessions, MD   8 Units at 12/20/17 1802  . insulin aspart (novoLOG) injection 0-5 Units  0-5 Units Subcutaneous QHS Fatima Blank, MD   2 Units at 12/20/17 2231  . insulin detemir (LEVEMIR) injection 38 Units  38 Units Subcutaneous QHS Fatima Blank, MD   38 Units at 12/20/17 2232  . LORazepam (ATIVAN) injection 1 mg  1 mg Intramuscular Q6H PRN Carmin Muskrat, MD      . nicotine (NICODERM CQ - dosed in mg/24 hours) patch 21 mg  21 mg Transdermal Daily Cardama,  Grayce Sessions, MD   21 mg at 12/20/17 1000  . traZODone (DESYREL) tablet 100 mg  100 mg Oral QHS PRN Larene Pickett, PA-C   100 mg at 12/20/17 2237  . venlafaxine XR (EFFEXOR-XR) 24 hr capsule 300 mg  300 mg Oral Q breakfast Larene Pickett, PA-C   300 mg at 12/21/17 0745  . ziprasidone (GEODON) capsule 20 mg  20 mg Oral BID WC Pattricia Boss, MD   20 mg at 12/21/17 0745  . ziprasidone (GEODON) injection 20 mg  20 mg Intramuscular Q6H PRN Carmin Muskrat, MD       Current Outpatient Medications  Medication Sig Dispense Refill  . diphenhydramine-acetaminophen (TYLENOL PM) 25-500 MG TABS tablet Take 2 tablets by mouth at bedtime as needed (for sleep).    . gabapentin (NEURONTIN) 400 MG capsule Take 3 capsules (1,200 mg total) by mouth 3 (three) times daily. 270 capsule 1  . HUMALOG 100 UNIT/ML injection Inject 12 Units into the skin 3 (three) times daily with meals.  0  . LEVEMIR 100 UNIT/ML injection Inject 38 Units into the skin at bedtime.  0  . traZODone (DESYREL) 100 MG tablet Take 1 tablet (100 mg total) by mouth at bedtime as needed for sleep. 30 tablet 2  . venlafaxine XR (EFFEXOR-XR) 150 MG 24 hr capsule Take 2 capsules (300 mg total) by mouth daily with breakfast. 60 capsule 1  . blood glucose meter kit and supplies KIT Dispense based on patient and insurance preference. Use up to four times daily as directed. (FOR ICD-9 250.00, 250.01). (Patient not taking: Reported on 12/06/2017) 1 each 0  . haloperidol decanoate (HALDOL DECANOATE) 100 MG/ML injection Inject 1 mL (100 mg total) into the muscle every 28 (twenty-eight) days. (Patient not taking: Reported on 12/06/2017) 1 mL 1  . insulin aspart (NOVOLOG) 100 UNIT/ML FlexPen Inject 12 Units into the skin 3 (three) times daily before meals.      Musculoskeletal: Strength & Muscle Tone: within normal limits Gait & Station: normal Patient leans: N/A  Psychiatric Specialty Exam: Physical Exam  Constitutional: He is oriented to person,  place, and time. He appears well-developed and well-nourished.  HENT:  Head: Normocephalic.  Respiratory: Effort normal.  Musculoskeletal: Normal range of motion.  Neurological: He is alert and oriented to person, place, and time.  Psychiatric: His speech is normal. Thought content normal. His mood appears anxious. His affect is angry. He is agitated. Cognition and memory are normal. He expresses impulsivity. He exhibits a depressed mood.    Review of Systems  Psychiatric/Behavioral: Positive for depression. Negative for hallucinations, memory loss, substance abuse and suicidal ideas. The patient is nervous/anxious. The patient does not have insomnia.   All other systems reviewed and are negative.   Blood pressure 105/60, pulse 65, temperature 98.3 F (36.8 C), temperature source Oral, resp. rate 16, SpO2 97 %.There is no height or weight on file to calculate BMI.  General Appearance: Disheveled  Eye Contact:  Fair  Speech:  Clear and Coherent  Volume:  Normal  Mood:  Depressed and frustration  Affect:  Congruent  Thought Process:  Coherent and Goal Directed  Orientation:  Full (Time, Place, and Person)  Thought Content:  Illogical  Suicidal Thoughts:  No, contracted for safety as long as he has place to live.  Homicidal Thoughts:  No  Memory:  Immediate;   Good Recent;   Good Remote;   Fair  Judgement:  Intact  Insight:  Fair  Psychomotor Activity:  Normal  Concentration:  Concentration: Good and Attention Span: Good  Recall:  Good  Fund of Knowledge:  Good  Language:  Good  Akathisia:  No  Handed:  Right  AIMS (if indicated):     Assets:  Communication Skills  ADL's:  Intact  Cognition:  WNL  Sleep:        Treatment Plan Summary:  Plan Adjustment disorder with depressed mood, chronic major depressive disorder and non compliance with treatment/ and homeless  Discharge Home / homeless shelters Follow up with [facility] services for out patient treatment Take all  medications as prescribed  Disposition: May contact with case management regarding referral to rescue mission - patient is willing to be discharged to rescue mission. No evidence of imminent risk to self or others at present.   Patient does not meet criteria for psychiatric inpatient admission. Supportive therapy provided about ongoing stressors. Discussed crisis plan, support from social network, calling 911, coming to the Emergency Department, and calling Suicide Hotline.  Ambrose Finland, MD 12/21/2017 12:37 PM

## 2017-12-21 NOTE — ED Notes (Signed)
Ordered Breakfast Tray  

## 2017-12-21 NOTE — Progress Notes (Signed)
[facility] information and hours of operation faxed to pt.   Trula SladeHeather Smart, MSW, LCSW Clinical Social Worker 12/21/2017 2:52 PM

## 2017-12-21 NOTE — ED Notes (Signed)
[place] [facility] [place] [facility] [place], [facility]. Pt states he was d/c'd from [facility] because I was pissing in the bed because of my diabetes. They also don't like to have needles there but I have to have them for my diabetes". [facility] and cannot go back. Pt states "I don't want to be stepping on anybody's toes though by being here until Monday". Advised pt Herbert SetaHeather will be faxing info re: [facility] who can assist him w/using their address in order for pt to complete paperwork for disability. Also advised pt [facility] is open on Sat/Sun 8a-12p so will attempt to contact them in the am. Pt voices agreement w/tx plan.

## 2017-12-21 NOTE — ED Notes (Signed)
Mark [name], AC, aware of consult to psychiatry.

## 2017-12-21 NOTE — BH Assessment (Signed)
TTS Reassessment: When asked how he was, patient states: "I still feel the same way," eluding to the fact that he states that he still feels suicidal.  When asked what his plan would be he states: "I would get a knife and stab myself in the stomach."  Patient states that he still feels depressed and somewhat anxious.  States that he did not sleep well last night and he states that he has been awake since 4:30 am.  Patient states that he has minimal support and he is homeless.  He states that he is in the process of applying for disability.  Patient states that he is unable to contract for safety outside of the hospital.  Will continue to seek placement for patient.

## 2017-12-21 NOTE — ED Notes (Signed)
Pt given Diet Coke x 2 and sugar free cookies for snack as requested.

## 2017-12-21 NOTE — ED Notes (Signed)
Carb Modified Diet was ordered for Dinner. 

## 2017-12-21 NOTE — ED Notes (Signed)
Pt states "I will try" when discussing possibly being [facility] to [facility]. Herbert SetaHeather, SW, [facility], aware. Pt sitting in chair in room watching. Pt ambulated to bathroom and back to room - improving steady gait noted. States "I need help walking sometimes because of my diabetic neuropathy". Pt states he has a current ID in his wallet which is locked up w/Security.

## 2017-12-22 LAB — CBG MONITORING, ED
GLUCOSE-CAPILLARY: 304 mg/dL — AB (ref 65–99)
Glucose-Capillary: 148 mg/dL — ABNORMAL HIGH (ref 65–99)
Glucose-Capillary: 98 mg/dL (ref 65–99)
Glucose-Capillary: 315 mg/dL — ABNORMAL HIGH (ref 65–99)
Glucose-Capillary: 400 mg/dL — ABNORMAL HIGH (ref 65–99)

## 2017-12-22 NOTE — ED Notes (Signed)
2nd breakfast tray delivered to pt d/t unable to eat 1st meal d/t french bread noted to be too hard.

## 2017-12-22 NOTE — Care Management (Addendum)
ED CM [facility] [place] both Homeless Shelters are full to their capacity.  Contacted Bennita Curtain [phone] ext [phone] with  [facility], awaiting a return call.

## 2017-12-22 NOTE — ED Notes (Signed)
Pt aware Case Manager is assisting w/homeless shelter or other place to go and insulin. Pt voices appreciation. States he does not feel SI if he has a place to go.

## 2017-12-22 NOTE — ED Notes (Addendum)
Pt called [facility] [phone]- [phone] - and left a message. Pt also attempting to call [facility] - [phone]. [place] 01[place]02[place]18 so wants to stay nearby.

## 2017-12-22 NOTE — ED Notes (Signed)
Ativan given as requested. Pt noted to be cursing - stating he is frustrated. States he wants to leave tomorrow but wants to make sure he wants to make sure he has a place to go to. Also states tomorrow is his birthday.

## 2017-12-22 NOTE — ED Notes (Signed)
Patient denies pain and is resting comfortably.  

## 2017-12-22 NOTE — ED Notes (Signed)
Pt noted to be irritable - refused for NT to obtain VS's. RN was able to obtain VS's from pt. Pt states he is worried about possibly leaving tomorrow and having to go to jail after his court date on 01/02/18. States he wants to go to jail instead of having to be on probation.

## 2017-12-22 NOTE — ED Notes (Signed)
Pt eating snack given. 

## 2017-12-22 NOTE — ED Notes (Signed)
Pt eating snack given - Caff-Free Diet Coke and sugar-free cookies.

## 2017-12-22 NOTE — ED Notes (Signed)
At 420 pt woke up and stated he "am wet."  Pt ambulated to the bathroom w/ Sitter w/o difficulties.  RN changed linens.  While waiting for pt to finish using the bathroom RN and sitter heard a "thump" RN and sitter opened door and found pt sitting against the wall.  Pt stated he was using the bathroom and lost his balance.  He reported that his back was sore "where I fell" rating the pain a 5/10.  No obvious signs of injury. Pt got up and walked back to room w/ assistance.  RN notified both Dr. Patria Maneampos and PA Allyne GeeSanders along w/ Charge RN.  Vitals and CBG stable.

## 2017-12-22 NOTE — ED Provider Notes (Signed)
8:02 AM asked to see patient due to a fall in the middle the night.  Patient states he has neuropathy in his legs with frequent falls.  He states that he hit his thoracic back on the wall.  He states the pain is improving in his back.  He denies any weakness or numbness in his extremities.  He is comfortable and well-appearing.  On exam he has some mild paraspinal tenderness of his mid and upper thoracic back.  He does not have any step-offs, deformities, or ecchymosis.  My suspicion of a significant injury such as fracture or spinal emergency is quite low on this ground-level fall.  Offered symptomatic treatment such as ibuprofen and Tylenol or ice but he declines all of these.  Will reassess if he develops worsening or new back pain.   Pricilla LovelessGoldston, Carvell Hoeffner, MD 12/22/17 [phone]

## 2017-12-22 NOTE — ED Notes (Addendum)
Pt aware CM to assist w/him getting his insulin and CM and SW will assist w/[facility].

## 2017-12-22 NOTE — Care Management (Signed)
ED CM will leave handoff for daytime ED CM and CSW to follow up with shelter bed in the am.

## 2017-12-22 NOTE — ED Notes (Signed)
Pt on phone - attempting to call his sister.

## 2017-12-22 NOTE — ED Notes (Signed)
Dr Criss AlvineGoldston in w/pt assessing c/o back pain when fell in bathroom earlier this am.

## 2017-12-23 ENCOUNTER — Emergency Department (HOSPITAL_COMMUNITY): Payer: Self-pay

## 2017-12-23 LAB — CBG MONITORING, ED
GLUCOSE-CAPILLARY: 210 mg/dL — AB (ref 65–99)
GLUCOSE-CAPILLARY: 227 mg/dL — AB (ref 65–99)

## 2017-12-23 NOTE — ED Notes (Signed)
This RN had just stepped out of a patient's room and loud "thud" heard coming from shower room. Went to assess noise and found that pt had slipped and fallen in shower. This RN was not even aware pt was showering at the time. Upon assessment, pt a&ox4 and denied LOC. Pt c/o pain to posterior head, stated his head hit arm rail on wall during fall. Lying supine, VS: 134/87 - 105HR - 100% Pt c/o dizziness when sitting, denies neck pain. Dr. Rubin PayorPickering notified immediately, came to assess. Per Pickering, no collar for now. Will CT head STAT.

## 2017-12-23 NOTE — ED Provider Notes (Signed)
  Physical Exam  BP 106/65 (BP Location: Right Arm)   Pulse (!) 51   Temp 99.2 F (37.3 C) (Oral)   Resp 18   SpO2 97%   Physical Exam Mild tenderness to occipital area.  Slight midline cervical tenderness.  Actually has good range of motion however in the cervical spine. ED Course/Procedures     Procedures  MDM   Called to see patient in psychiatric ER.  Patient fell while he was in the shower.  States that he was trying to dry off and fell backwards hitting his head.  Complaining of pain in his head and neck.  States he "almost passed out".  Continues to have pain in the back of his head.  Will get head CT and cervical spine CT.  Head.CT and cervical spine CT have returned.  No acute changes.  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

## 2017-12-23 NOTE — Progress Notes (Signed)
Snack given to Pt.

## 2017-12-23 NOTE — ED Notes (Signed)
Breakfast tray ordered 

## 2017-12-23 NOTE — Progress Notes (Signed)
Pt awake and ask sitter to warm breakfast tray. Sitter explained to Pt that the tray cold not be warmed because it had already enter room. However, this would be a courtesy. When Pt returned with tray, Pt stated to sitter, " I do not see why you can not warm the next tray", sitter explained to Pt again that this was a one time courtesy and the next tray can not be warmed.

## 2017-12-23 NOTE — Discharge Instructions (Signed)
Follow-up with the resources given. 

## 2017-12-23 NOTE — Progress Notes (Signed)
Mark [name], Cheree DittoGraham Crackers and [employer]iet Coke given to Pt.

## 2017-12-23 NOTE — Progress Notes (Signed)
CSW consulted for transportation [facility]. CSW spoke with RN and was informed that pt is in need of transportation to this destination. CSW offered taxi vouchure to pt and later was informed that pt declined needing transportationas pt would have a friend to pick pt up and transport to this destination. At this time CSW has left voucher with RN just in case pt's ride doe snot show up to pick pt up.     Claude MangesKierra S. Taisley Mordan, MSW, LCSW-A Emergency Department Clinical Social Worker [phone]

## 2017-12-23 NOTE — ED Notes (Signed)
Pt given all d/c papers and belongings. Wheeled to waiting room, given taxi voucher to transport directly [facility]

## 2017-12-23 NOTE — ED Notes (Addendum)
Pt spoke with Courtney HeysWilliam Green at [facility] about having a room to stay tonight. Confirmed that a bed is available for him. Pt is Psych-cleared, IVC papers have been rescinded. D/c papers to follow. Pt's friend will take him to [facility] after d/c.

## 2017-12-23 NOTE — ED Notes (Signed)
Pt begging for leftover cobbler from another pt that didn't eat it. Told pt that it wasn't a good idea since his blood sugar was probably going to be high. Pt states "Well, you have insulin, and can treat it, why not?"

## 2017-12-23 NOTE — ED Notes (Signed)
Patient transported to CT 

## 2017-12-29 ENCOUNTER — Encounter: Payer: Self-pay | Admitting: Emergency Medicine

## 2017-12-29 ENCOUNTER — Other Ambulatory Visit: Payer: Self-pay

## 2017-12-29 ENCOUNTER — Inpatient Hospital Stay
Admission: EM | Admit: 2017-12-29 | Discharge: 2017-12-31 | DRG: 638 | Disposition: A | Discharge status: Home / Self Care | Payer: Medicaid Other | Attending: Internal Medicine | Admitting: Internal Medicine

## 2017-12-29 DIAGNOSIS — Z9114 Patient's other noncompliance with medication regimen: Secondary | ICD-10-CM

## 2017-12-29 DIAGNOSIS — Z79891 Long term (current) use of opiate analgesic: Secondary | ICD-10-CM

## 2017-12-29 DIAGNOSIS — Z87442 Personal history of urinary calculi: Secondary | ICD-10-CM

## 2017-12-29 DIAGNOSIS — F1290 Cannabis use, unspecified, uncomplicated: Secondary | ICD-10-CM | POA: Diagnosis present

## 2017-12-29 DIAGNOSIS — N289 Disorder of kidney and ureter, unspecified: Secondary | ICD-10-CM | POA: Diagnosis present

## 2017-12-29 DIAGNOSIS — F314 Bipolar disorder, current episode depressed, severe, without psychotic features: Secondary | ICD-10-CM | POA: Diagnosis present

## 2017-12-29 DIAGNOSIS — F4321 Adjustment disorder with depressed mood: Secondary | ICD-10-CM | POA: Diagnosis present

## 2017-12-29 DIAGNOSIS — Z881 Allergy status to other antibiotic agents status: Secondary | ICD-10-CM

## 2017-12-29 DIAGNOSIS — Z880 Allergy status to penicillin: Secondary | ICD-10-CM

## 2017-12-29 DIAGNOSIS — Z915 Personal history of self-harm: Secondary | ICD-10-CM

## 2017-12-29 DIAGNOSIS — Z79899 Other long term (current) drug therapy: Secondary | ICD-10-CM

## 2017-12-29 DIAGNOSIS — F609 Personality disorder, unspecified: Secondary | ICD-10-CM | POA: Diagnosis present

## 2017-12-29 DIAGNOSIS — F332 Major depressive disorder, recurrent severe without psychotic features: Secondary | ICD-10-CM | POA: Diagnosis present

## 2017-12-29 DIAGNOSIS — F607 Dependent personality disorder: Secondary | ICD-10-CM | POA: Diagnosis present

## 2017-12-29 DIAGNOSIS — E1010 Type 1 diabetes mellitus with ketoacidosis without coma: Secondary | ICD-10-CM

## 2017-12-29 DIAGNOSIS — Z91018 Allergy to other foods: Secondary | ICD-10-CM

## 2017-12-29 DIAGNOSIS — E1110 Type 2 diabetes mellitus with ketoacidosis without coma: Secondary | ICD-10-CM | POA: Diagnosis present

## 2017-12-29 DIAGNOSIS — E1310 Other specified diabetes mellitus with ketoacidosis without coma: Secondary | ICD-10-CM

## 2017-12-29 DIAGNOSIS — F17210 Nicotine dependence, cigarettes, uncomplicated: Secondary | ICD-10-CM | POA: Diagnosis present

## 2017-12-29 DIAGNOSIS — F341 Dysthymic disorder: Secondary | ICD-10-CM | POA: Diagnosis present

## 2017-12-29 DIAGNOSIS — Z794 Long term (current) use of insulin: Secondary | ICD-10-CM

## 2017-12-29 DIAGNOSIS — Z888 Allergy status to other drugs, medicaments and biological substances status: Secondary | ICD-10-CM

## 2017-12-29 HISTORY — DX: Depression, unspecified: F32.A

## 2017-12-29 HISTORY — DX: Major depressive disorder, single episode, unspecified: F32.9

## 2017-12-29 LAB — BASIC METABOLIC PANEL
ANION GAP: 30 — AB (ref 5–15)
Anion gap: 17 — ABNORMAL HIGH (ref 5–15)
Anion gap: 13 (ref 5–15)
BUN: 37 mg/dL — ABNORMAL HIGH (ref 6–20)
BUN: 29 mg/dL — ABNORMAL HIGH (ref 6–20)
BUN: 22 mg/dL — ABNORMAL HIGH (ref 6–20)
CALCIUM: 9.1 mg/dL (ref 8.9–10.3)
CALCIUM: 9.1 mg/dL (ref 8.9–10.3)
CHLORIDE: 96 mmol/L — AB (ref 101–111)
CO2: 10 mmol/L — AB (ref 22–32)
CO2: 17 mmol/L — ABNORMAL LOW (ref 22–32)
CO2: 21 mmol/L — AB (ref 22–32)
CREATININE: 1.49 mg/dL — AB (ref 0.61–1.24)
CREATININE: 1.17 mg/dL (ref 0.61–1.24)
Calcium: 9.9 mg/dL (ref 8.9–10.3)
Chloride: 100 mmol/L — ABNORMAL LOW (ref 101–111)
Chloride: 103 mmol/L (ref 101–111)
Creatinine, Ser: 2.11 mg/dL — ABNORMAL HIGH (ref 0.61–1.24)
GFR calc non Af Amer: 39 mL/min — ABNORMAL LOW (ref >60)
GFR, EST AFRICAN AMERICAN: 45 mL/min — AB (ref >60)
GFR, EST NON AFRICAN AMERICAN: 60 mL/min — AB (ref >60)
GLUCOSE: 798 mg/dL — AB (ref 65–99)
GLUCOSE: 396 mg/dL — AB (ref 65–99)
GLUCOSE: 204 mg/dL — AB (ref 65–99)
Potassium: 4 mmol/L (ref 3.5–5.1)
Potassium: 4.2 mmol/L (ref 3.5–5.1)
Potassium: 3.7 mmol/L (ref 3.5–5.1)
Sodium: 136 mmol/L (ref 135–145)
Sodium: 134 mmol/L — ABNORMAL LOW (ref 135–145)
Sodium: 137 mmol/L (ref 135–145)

## 2017-12-29 LAB — GLUCOSE, CAPILLARY
GLUCOSE-CAPILLARY: 153 mg/dL — AB (ref 65–99)
GLUCOSE-CAPILLARY: 192 mg/dL — AB (ref 65–99)
GLUCOSE-CAPILLARY: 194 mg/dL — AB (ref 65–99)
Glucose-Capillary: >600 mg/dL (ref 65–99)
Glucose-Capillary: 366 mg/dL — ABNORMAL HIGH (ref 65–99)
Glucose-Capillary: 433 mg/dL — ABNORMAL HIGH (ref 65–99)
Glucose-Capillary: 229 mg/dL — ABNORMAL HIGH (ref 65–99)
Glucose-Capillary: 207 mg/dL — ABNORMAL HIGH (ref 65–99)
Glucose-Capillary: 246 mg/dL — ABNORMAL HIGH (ref 65–99)
Glucose-Capillary: 254 mg/dL — ABNORMAL HIGH (ref 65–99)

## 2017-12-29 LAB — URINALYSIS, COMPLETE (UACMP) WITH MICROSCOPIC
BACTERIA UA: NONE SEEN
Bilirubin Urine: NEGATIVE
KETONES UR: 80 mg/dL — AB
Leukocytes, UA: NEGATIVE
Nitrite: NEGATIVE
PROTEIN: NEGATIVE mg/dL
SQUAMOUS EPITHELIAL / LPF: NONE SEEN
Specific Gravity, Urine: 1.021 (ref 1.005–1.030)
pH: 5 (ref 5.0–8.0)

## 2017-12-29 LAB — CBC
HEMATOCRIT: 50.1 % (ref 40.0–52.0)
HEMOGLOBIN: 15.9 g/dL (ref 13.0–18.0)
MCH: 30.9 pg (ref 26.0–34.0)
MCHC: 31.7 g/dL — AB (ref 32.0–36.0)
MCV: 97.2 fL (ref 80.0–100.0)
Platelets: 338 10*3/uL (ref 150–440)
RBC: 5.15 MIL/uL (ref 4.40–5.90)
RDW: 13.8 % (ref 11.5–14.5)
WBC: 18.6 10*3/uL — ABNORMAL HIGH (ref 3.8–10.6)

## 2017-12-29 LAB — MRSA PCR SCREENING: MRSA by PCR: NEGATIVE

## 2017-12-29 MED ORDER — SODIUM CHLORIDE 0.9 % IV SOLN
INTRAVENOUS | Status: DC
  Filled 2017-12-29: qty 1

## 2017-12-29 MED ORDER — SODIUM CHLORIDE 0.9 % IV SOLN
INTRAVENOUS | Status: DC
  Administered 2017-12-29: 150 mL/h via INTRAVENOUS
  Administered 2017-12-30 – 2017-12-31 (×4): via INTRAVENOUS

## 2017-12-29 MED ORDER — INSULIN DETEMIR 100 UNIT/ML ~~LOC~~ SOLN
38.0000 [IU] | Freq: Every day | SUBCUTANEOUS | Status: DC
  Administered 2017-12-29: 38 [IU] via SUBCUTANEOUS
  Filled 2017-12-29 (×3): qty 0.38

## 2017-12-29 MED ORDER — SODIUM CHLORIDE 0.9 % IV BOLUS (SEPSIS)
1000.0000 mL | Freq: Once | INTRAVENOUS | Status: AC
  Administered 2017-12-29: 1000 mL via INTRAVENOUS

## 2017-12-29 MED ORDER — DEXTROSE-NACL 5-0.45 % IV SOLN: INTRAVENOUS | Status: DC

## 2017-12-29 MED ORDER — INSULIN REGULAR HUMAN 100 UNIT/ML IJ SOLN
INTRAMUSCULAR | Status: DC
  Administered 2017-12-29: 5.4 [IU]/h via INTRAVENOUS
  Filled 2017-12-29: qty 1

## 2017-12-29 MED ORDER — POTASSIUM CHLORIDE 10 MEQ/100ML IV SOLN
10.0000 meq | INTRAVENOUS | Status: AC
  Administered 2017-12-29 (×2): 10 meq via INTRAVENOUS
  Filled 2017-12-29 (×2): qty 100

## 2017-12-29 MED ORDER — SODIUM CHLORIDE 0.9 % IV BOLUS (SEPSIS)
1000.0000 mL | Freq: Once | INTRAVENOUS | Status: AC
Start: 2017-12-29 — End: 2017-12-29
  Administered 2017-12-29: 1000 mL via INTRAVENOUS

## 2017-12-29 MED ORDER — INSULIN ASPART 100 UNIT/ML ~~LOC~~ SOLN
0.0000 [IU] | SUBCUTANEOUS | Status: DC
  Administered 2017-12-30: 3 [IU] via SUBCUTANEOUS
  Administered 2017-12-30: 4 [IU] via SUBCUTANEOUS
  Filled 2017-12-29 (×2): qty 1

## 2017-12-29 MED ORDER — DEXTROSE-NACL 5-0.45 % IV SOLN
INTRAVENOUS | Status: DC
  Administered 2017-12-29: 125 mL/h via INTRAVENOUS
  Administered 2017-12-29: 16:00:00 via INTRAVENOUS

## 2017-12-29 MED ORDER — SODIUM CHLORIDE 0.9 % IV SOLN: INTRAVENOUS | Status: AC

## 2017-12-29 NOTE — ED Notes (Signed)
Patient advised that he couldn't eat anything at this time.

## 2017-12-29 NOTE — ED Notes (Signed)
Unable to call report to ICU. Bed assignment changed.

## 2017-12-29 NOTE — ED Provider Notes (Signed)
[facility] Emergency Department Provider Note   ____________________________________________   I have reviewed the triage vital signs and the nursing notes.   HISTORY  Chief Complaint Hyperglycemia   History limited by: Not Limited   HPI Mark [name] is a 32 y.o. male who presents to the emergency department today because of concerns for high blood sugar.  He states that his sugars been high for the past couple of days.  He does have a history of insulin-dependent diabetes.  Frequent hospitalizations and ER visits for hyperglycemia. He claims that he has been taking his insulin as prescribed over the past couple of days however he is concerned because his insulin is expired that it is not working.  He has had associated nausea and vomiting. He denies any fevers.   Per medical record review patient has a history of DM  Past Medical History:  Diagnosis Date  . Diabetes mellitus without complication ([place])   . Hydrocephalus   . Kidney stones     Patient Active Problem List   Diagnosis Date Noted  . Suicidal ideation   . Major depressive disorder, recurrent severe without psychotic features ([name])   . Adjustment disorder with depressed mood 12/07/2017  . Noncompliance 10/17/2017  . Dysthymia 10/08/2017  . Personality disorder ([name]) 09/26/2017  . 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 Obstructive hydrocephalus 09/19/2017  . Diabetes ([place]) 09/11/2017  . Hypoglycemia 09/09/2017  . MDD (major depressive disorder) 08/22/2017  . Severe recurrent major depression with psychotic features ([name]) 08/19/2017  . Nausea & vomiting 08/18/2017  . Abdominal pain 08/18/2017  . Hyponatremia 08/18/2017  . DKA (diabetic ketoacidoses) ([name]) 11/24/2015  . Diabetic keto-acidosis ([name]) 10/23/2015    Past Surgical History:  Procedure Laterality Date  . KIDNEY STONE SURGERY      Prior to Admission medications   Medication Sig Start Date End Date Taking? Authorizing Provider   blood glucose meter kit and supplies KIT Dispense based on patient and insurance preference. Use up to four times daily as directed. (FOR ICD-9 250.00, 250.01). Patient not taking: Reported on 12/06/2017 10/08/17   Harvest Dark, MD  diphenhydramine-acetaminophen (TYLENOL PM) 25-500 MG TABS tablet Take 2 tablets by mouth at bedtime as needed (for sleep).    [provider]  gabapentin (NEURONTIN) 400 MG capsule Take 3 capsules (1,200 mg total) by mouth 3 (three) times daily. 09/17/17   Pucilowska, Herma Ard B, MD  haloperidol decanoate (HALDOL DECANOATE) 100 MG/ML injection Inject 1 mL (100 mg total) into the muscle every 28 (twenty-eight) days. Patient not taking: Reported on 12/06/2017 09/25/17   Pucilowska, Herma Ard B, MD  HUMALOG 100 UNIT/ML injection Inject 12 Units into the skin 3 (three) times daily with meals. 12/03/17   [provider]  insulin aspart (NOVOLOG) 100 UNIT/ML FlexPen Inject 12 Units into the skin 3 (three) times daily before meals. 11/08/16   [provider]  LEVEMIR 100 UNIT/ML injection Inject 38 Units into the skin at bedtime. 12/03/17   [provider]  traZODone (DESYREL) 100 MG tablet Take 1 tablet (100 mg total) by mouth at bedtime as needed for sleep. 09/17/17   Pucilowska, Herma Ard B, MD  venlafaxine XR (EFFEXOR-XR) 150 MG 24 hr capsule Take 2 capsules (300 mg total) by mouth daily with breakfast. 09/26/17   Pucilowska, Wardell Honour, MD    Allergies Mushroom extract complex; Penicillins; Sulfa antibiotics; and Clindamycin/lincomycin  Family History  Problem Relation Age of Onset  . Breast cancer Mother     Social  History Social History   Tobacco Use  . Smoking status: Current Every Day Smoker    Packs/day: 1.00    Types: Cigarettes  . Smokeless tobacco: Never Used  Substance Use Topics  . Alcohol use: No    Comment: occassional   . Drug use: Yes    Types: Marijuana    Comment: ''I haven't used in over a month"    Review  of Systems Constitutional: No fever/chills Eyes: No visual changes. ENT: No sore throat. Cardiovascular: Denies chest pain. Respiratory: Denies shortness of breath. Gastrointestinal: Positive for nausea.  Genitourinary: Negative for dysuria. Musculoskeletal: Negative for back pain. Skin: Negative for rash. Neurological: Negative for headaches, focal weakness or numbness.  ____________________________________________   PHYSICAL EXAM:  VITAL SIGNS: ED Triage Vitals  Enc Vitals Group     BP 12/29/17 1056 125/83     Pulse Rate 12/29/17 1056 (!) 116     Resp 12/29/17 1056 18     Temp 12/29/17 1056 98.2 F (36.8 C)     Temp Source 12/29/17 1056 Oral     SpO2 12/29/17 1056 98 %     Weight 12/29/17 1057 220 lb (99.8 kg)     Height 12/29/17 1057 6' 2"  (1.88 m)     Head Circumference --      Peak Flow --      Pain Score 12/29/17 1057 0   Constitutional: Alert and oriented. Well appearing and in no distress. Eyes: Conjunctivae are normal.  ENT   Head: Normocephalic and atraumatic.   Nose: No congestion/rhinnorhea.   Mouth/Throat: Mucous membranes are moist.   Neck: No stridor. Hematological/Lymphatic/Immunilogical: No cervical lymphadenopathy. Cardiovascular: Tachycardic, regular rhythm.  No murmurs, rubs, or gallops.  Respiratory: Normal respiratory effort without tachypnea nor retractions. Breath sounds are clear and equal bilaterally. No wheezes/rales/rhonchi. Gastrointestinal: Soft and non tender. No rebound. No guarding.  Genitourinary: Deferred Musculoskeletal: Normal range of motion in all extremities. No lower extremity edema. Neurologic:  Normal speech and language. No gross focal neurologic deficits are appreciated.  Skin:  Skin is warm, dry and intact. No rash noted. Psychiatric: Mood and affect are normal. Speech and behavior are normal. Patient exhibits appropriate insight and judgment.  ____________________________________________    LABS (pertinent  positives/negatives)  CBC wbc 18.6, hgb 15.9, plt 338 BMP glu 7998, cr 2.11, anion gap 30 UA ketones 80  ____________________________________________   EKG  None  ____________________________________________    RADIOLOGY  None  ____________________________________________   PROCEDURES  Procedures  CRITICAL CARE Performed by: Nance Pear   Total critical care time: 30 minutes  Critical care time was exclusive of separately billable procedures and treating other patients.  Critical care was necessary to treat or prevent imminent or life-threatening deterioration.  Critical care was time spent personally by me on the following activities: development of treatment plan with patient and/or surrogate as well as nursing, discussions with consultants, evaluation of patient's response to treatment, examination of patient, obtaining history from patient or surrogate, ordering and performing treatments and interventions, ordering and review of laboratory studies, ordering and review of radiographic studies, pulse oximetry and re-evaluation of patient's condition.  ____________________________________________   INITIAL IMPRESSION / ASSESSMENT AND PLAN / ED COURSE  Pertinent labs & imaging results that were available during my care of the patient were reviewed by me and considered in my medical decision making (see chart for details).  Patient presented to the emergency department today because of concerns for elevated blood sugar.  Patient does have history  of insulin dependent diabetes.  Blood sugar was elevated greater than 700.  Anion gap of 30.  He did have ketones in his urine.  Even before VBG comes back I do feel like patient is in DKA.  Patient was written for insulin drip.  Will plan on admission to the hospital service. Discussed findings and plan with patient.    ____________________________________________   FINAL CLINICAL IMPRESSION(S) / ED  DIAGNOSES  Final diagnoses:  Diabetic ketoacidosis without coma associated with other specified diabetes mellitus ([place])     Note: This dictation was prepared with Dragon dictation. Any transcriptional errors that result from this process are unintentional    Nance Pear, MD 12/29/17 1216

## 2017-12-29 NOTE — ED Triage Notes (Signed)
Pt arrived via POV with friend Herbert SetaHeather who the patient is staying with.   Herbert SetaHeather- [phone] Tasia CatchingsCraig [phone]  Pt states that he checked his blood sugar this morning and was high on his meter. Pt states he has been taking insulin and states he thinks the insulin was expired. Pt [facility].

## 2017-12-29 NOTE — H&P (Signed)
[place] [facility] [place] NAME: Luigi Stuckey    MR#:  [id number]  DATE OF BIRTH:  Sep 25, 1986  DATE OF ADMISSION:  12/29/2017  PRIMARY CARE PHYSICIAN: Patient, No Pcp Per   REQUESTING/REFERRING PHYSICIAN:   CHIEF COMPLAINT:   Chief Complaint  Patient presents with  . Hyperglycemia    HISTORY OF PRESENT ILLNESS: Momodou Consiglio  is a 32 y.o. male with a known history of diabetes mellitus insulin-dependent, nephrolithiasis presented to the emergency room with elevated blood sugar.  The blood sugar was 798 mg/dL.  Patient also had some nausea and vomitings.  Patient says he has tried the insulin medication at home but says he has been in and out of hospital and hence the dose was inadequate.  Has polyuria and polyphagia.  He was started on IV insulin drip in the emergency room for control of blood sugars.  No complaints of any chest pain, shortness of breath.  PAST MEDICAL HISTORY:   Past Medical History:  Diagnosis Date  . Diabetes mellitus without complication ([place])   . Hydrocephalus   . Kidney stones     PAST SURGICAL HISTORY:  Past Surgical History:  Procedure Laterality Date  . KIDNEY STONE SURGERY      SOCIAL HISTORY:  Social History   Tobacco Use  . Smoking status: Current Every Day Smoker    Packs/day: 1.00    Types: Cigarettes  . Smokeless tobacco: Never Used  Substance Use Topics  . Alcohol use: No    Comment: occassional     FAMILY HISTORY:  Family History  Problem Relation Age of Onset  . Breast cancer Mother     DRUG ALLERGIES:  Allergies  Allergen Reactions  . Mushroom Extract Complex Hives, Itching and Nausea And Vomiting  . Penicillins Anaphylaxis, Hives and Swelling    Has patient had a PCN reaction causing immediate rash, facial/tongue/throat swelling, SOB or lightheadedness with hypotension: Yes Has patient had a PCN reaction causing severe rash involving mucus membranes or skin necrosis: No Has  patient had a PCN reaction that required hospitalization Yes Has patient had a PCN reaction occurring within the last 10 years: Yes If all of the above answers are "NO", then may proceed with Cephalosporin use.  . Sulfa Antibiotics Anaphylaxis and Hives  . Clindamycin/Lincomycin Hives    REVIEW OF SYSTEMS:   CONSTITUTIONAL: No fever, fatigue or weakness.  EYES: No blurred or double vision.  EARS, NOSE, AND THROAT: No tinnitus or ear pain.  RESPIRATORY: No cough, shortness of breath, wheezing or hemoptysis.  CARDIOVASCULAR: No chest pain, orthopnea, edema.  GASTROINTESTINAL: Has nausea, vomiting,  No diarrhea or abdominal pain.  GENITOURINARY: No dysuria, hematuria.  ENDOCRINE: Has polyuria, nocturia,  HEMATOLOGY: No anemia, easy bruising or bleeding SKIN: No rash or lesion. MUSCULOSKELETAL: No joint pain or arthritis.   NEUROLOGIC: No tingling, numbness, weakness.  PSYCHIATRY: No anxiety or depression.   MEDICATIONS AT HOME:  Prior to Admission medications   Medication Sig Start Date End Date Taking? Authorizing Provider  diphenhydramine-acetaminophen (TYLENOL PM) 25-500 MG TABS tablet Take 2 tablets by mouth at bedtime as needed (for sleep).   Yes [provider]  gabapentin (NEURONTIN) 400 MG capsule Take 3 capsules (1,200 mg total) by mouth 3 (three) times daily. 09/17/17  Yes Pucilowska, Jolanta B, MD  traZODone (DESYREL) 100 MG tablet Take 1 tablet (100 mg total) by mouth at bedtime as needed for sleep. 09/17/17  Yes Pucilowska, Wardell Honour, MD  venlafaxine XR (EFFEXOR-XR) 150 MG 24 hr capsule Take 2 capsules (300 mg total) by mouth daily with breakfast. 09/26/17  Yes Pucilowska, Jolanta B, MD  blood glucose meter kit and supplies KIT Dispense based on patient and insurance preference. Use up to four times daily as directed. (FOR ICD-9 250.00, 250.01). Patient not taking: Reported on 12/06/2017 10/08/17   Harvest Dark, MD  haloperidol decanoate (HALDOL DECANOATE) 100  MG/ML injection Inject 1 mL (100 mg total) into the muscle every 28 (twenty-eight) days. Patient not taking: Reported on 12/06/2017 09/25/17   Pucilowska, Herma Ard B, MD  HUMALOG 100 UNIT/ML injection Inject 12 Units into the skin 3 (three) times daily with meals. 12/03/17   [provider]  insulin aspart (NOVOLOG) 100 UNIT/ML FlexPen Inject 12 Units into the skin 3 (three) times daily before meals. 11/08/16   [provider]  LEVEMIR 100 UNIT/ML injection Inject 38 Units into the skin at bedtime. 12/03/17   [provider]      PHYSICAL EXAMINATION:   VITAL SIGNS: Blood pressure 118/85, pulse 94, temperature 98.2 F (36.8 C), temperature source Oral, resp. rate 16, height _0  (1.88 m), weight 99.8 kg (220 lb), SpO2 100 %.  GENERAL:  32 y.o.-year-old patient lying in the bed with no acute distress.  EYES: Pupils equal, round, reactive to light and accommodation. No scleral icterus. Extraocular muscles intact.  HEENT: Head atraumatic, normocephalic. Oropharynx dry and nasopharynx clear.  NECK:  Supple, no jugular venous distention. No thyroid enlargement, no tenderness.  LUNGS: Normal breath sounds bilaterally, no wheezing, rales,rhonchi or crepitation. No use of accessory muscles of respiration.  CARDIOVASCULAR: S1, S2 normal. No murmurs, rubs, or gallops.  ABDOMEN: Soft, nontender, nondistended. Bowel sounds present. No organomegaly or mass.  EXTREMITIES: No pedal edema, cyanosis, or clubbing.  NEUROLOGIC: Cranial nerves II through XII are intact. Muscle strength 5/5 in all extremities. Sensation intact. Gait not checked.  PSYCHIATRIC: The patient is alert and oriented x 3.  SKIN: No obvious rash, lesion, or ulcer.   LABORATORY PANEL:   CBC Recent Labs  Lab 12/29/17 1107  WBC 18.6*  HGB 15.9  HCT 50.1  PLT 338  MCV 97.2  MCH 30.9  MCHC 31.7*  RDW 13.8    ------------------------------------------------------------------------------------------------------------------  Chemistries  Recent Labs  Lab 12/29/17 1107  NA 136  K 4.0  CL 96*  CO2 10*  GLUCOSE 798*  BUN 37*  CREATININE 2.11*  CALCIUM 9.9   ------------------------------------------------------------------------------------------------------------------ estimated creatinine clearance is 62.2 mL/min (A) (by C-G formula based on SCr of 2.11 mg/dL (H)). ------------------------------------------------------------------------------------------------------------------ No results for input(s): TSH, T4TOTAL, T3FREE, THYROIDAB in the last 72 hours.  Invalid input(s): FREET3   Coagulation profile No results for input(s): INR, PROTIME in the last 168 hours. ------------------------------------------------------------------------------------------------------------------- No results for input(s): DDIMER in the last 72 hours. -------------------------------------------------------------------------------------------------------------------  Cardiac Enzymes No results for input(s): CKMB, TROPONINI, MYOGLOBIN in the last 168 hours.  Invalid input(s): CK ------------------------------------------------------------------------------------------------------------------ Invalid input(s): POCBNP  ---------------------------------------------------------------------------------------------------------------  Urinalysis    Component Value Date/Time   COLORURINE STRAW (A) 12/29/2017 1145   APPEARANCEUR CLEAR (A) 12/29/2017 1145   APPEARANCEUR Clear 01/20/2015 2136   LABSPEC 1.021 12/29/2017 1145   LABSPEC 1.023 01/20/2015 2136   PHURINE 5.0 12/29/2017 1145   GLUCOSEU >=500 (A) 12/29/2017 1145   GLUCOSEU >=500 01/20/2015 2136   HGBUR MODERATE (A) 12/29/2017 1145   BILIRUBINUR NEGATIVE 12/29/2017 1145   BILIRUBINUR Negative 01/20/2015 2136   KETONESUR 80 (A) 12/29/2017 1145    PROTEINUR NEGATIVE  12/29/2017 1145   NITRITE NEGATIVE 12/29/2017 1145   LEUKOCYTESUR NEGATIVE 12/29/2017 1145   LEUKOCYTESUR Negative 01/20/2015 2136     RADIOLOGY: No results found.  EKG: Orders placed or performed during the hospital encounter of 09/09/17  . ED EKG  . ED EKG  . EKG 12-Lead  . EKG 12-Lead    IMPRESSION AND PLAN: 32 year old male patient with history of insulin-dependent diabetes mellitus presented to the emergency room with nausea, vomiting and elevated blood sugar.  1.  Diabetic ketoacidosis  Admit patient to ICU inpatient service IV insulin drip for blood sugar control  2.Increased anion gap metabolic acidosis IV insulin drip Frequent blood sugars every hourly Follow-up anion gap  3.Nausea and vomiting Antiemetics  4.  Acute renal insufficiency IV fluid hydration   All the records are reviewed and case discussed with ED provider. Management plans discussed with the patient, family and they are in agreement.  CODE STATUS:FULL CODE Code Status History    Date Active Date Inactive Code Status Order ID Comments User Context   12/06/2017 14:19 12/07/2017 14:44 Full Code [id number]  Domenic Moras, PA-C ED   09/10/2017 22:30 09/26/2017 15:54 Full Code [id number]  Gonzella Lex, MD Inpatient   09/10/2017 22:30 09/10/2017 22:30 Full Code [id number]  Gonzella Lex, MD Inpatient   09/09/2017 20:04 09/10/2017 21:53 Full Code [id number]  Gorden Harms, MD Inpatient   08/22/2017 16:05 08/29/2017 16:13 Full Code [id number]  Marylin Crosby, MD Inpatient   08/22/2017 15:18 08/22/2017 16:05 Full Code [id number]  Gonzella Lex, MD Inpatient   08/22/2017 15:18 08/22/2017 15:18 Full Code [id number]  Gonzella Lex, MD Inpatient   08/18/2017 17:25 08/22/2017 15:15 Full Code [id number]  Idelle Crouch, MD Inpatient   11/24/2015 13:56 11/25/2015 18:28 Full Code [id number]  Bettey Costa, MD Inpatient   10/23/2015 10:12 10/24/2015 20:27 Full Code [id number]  Loletha Grayer, MD ED        TOTAL CRITICAL CARE TIME TAKING CARE OF THIS PATIENT: 55 minutes.    Saundra Shelling M.D on 12/29/2017 at 12:42 PM  Between 7am to 6pm - Pager - [phone]  After 6pm go to [email or web address] - password [email or web address] [place] [facility]  Office  [phone]  CC: Primary care physician; Patient, No Pcp Per

## 2017-12-29 NOTE — ED Notes (Signed)
Dr. Derrill KayGoodman aware of blood glucose of 798.

## 2017-12-29 NOTE — Progress Notes (Signed)
Admitted from ED for DKA. Noncompliant with insulin regiment.

## 2017-12-29 NOTE — Consult Note (Signed)
[facility] Consultation   ASSESSMENT/PLAN   Recurrent DKA. Place on DKA protocol, fluid resuscitation, insulin infusion, close management of electrolytes. Acid-base status entirely consistent with DKA. Patient's anion gap of 30, delta gap of 18, calculated starting bicarbonate 28 consistent with a mild initial metabolic alkalosis most likely contraction with subsequent anion gap metabolic acidosis secondary to ketosis. Elevated white count most likely reactionary. Patient has no critical manifestations of infection. We'll be available to assist with management. Patient is on DKA protocol, hemodynamically stable in no acute distress.   INTAKE / OUTPUT:  Intake/Output Summary (Last 24 hours) at 12/29/2017 1454 Last data filed at 12/29/2017 1342 Gross per 24 hour  Intake 2000 ml  Output -  Net 2000 ml     Name: Mark [name] MRN: [id number] DOB: [birth year]    ADMISSION DATE:  12/29/2017 CONSULTATION DATE:  12/29/2017  REFERRING MD :  Hospitalist Service  CHIEF COMPLAINT:  Nausea Vomiting   HISTORY OF PRESENT ILLNESS:  Mark [name] is a 32 year old gentleman with a past medical history remarkable for nephrolithiasis, hydrocephalus, multiple admissions for DKA. Patient states that most of this is his fault he has difficulty with obtaining medications and sometimes does not take his medicines. Presented with nausea, vomiting and recurrent symptoms of DKA. When he presented to the emergency department he had a blood sugar of 798, and anion gap of 30, a white count of 18.6, serum bicarbonate of 10, positive urine ketones Was placed on DKA protocol and admitted to the intensive care unit. Presently he is awake, alert, states he is feeling better in no acute distress.  PAST MEDICAL HISTORY :  Past Medical History:  Diagnosis Date  . Diabetes mellitus without complication ([place])   . Hydrocephalus   . Kidney stones    Past Surgical History:  Procedure Laterality Date    . KIDNEY STONE SURGERY     Prior to Admission medications   Medication Sig Start Date End Date Taking? Authorizing Provider  diphenhydramine-acetaminophen (TYLENOL PM) 25-500 MG TABS tablet Take 2 tablets by mouth at bedtime as needed (for sleep).   Yes [provider]  gabapentin (NEURONTIN) 400 MG capsule Take 3 capsules (1,200 mg total) by mouth 3 (three) times daily. 09/17/17  Yes Pucilowska, Jolanta B, MD  traZODone (DESYREL) 100 MG tablet Take 1 tablet (100 mg total) by mouth at bedtime as needed for sleep. 09/17/17  Yes Pucilowska, Jolanta B, MD  venlafaxine XR (EFFEXOR-XR) 150 MG 24 hr capsule Take 2 capsules (300 mg total) by mouth daily with breakfast. 09/26/17  Yes Pucilowska, Jolanta B, MD  blood glucose meter kit and supplies KIT Dispense based on patient and insurance preference. Use up to four times daily as directed. (FOR ICD-9 250.00, 250.01). Patient not taking: Reported on 12/06/2017 10/08/17   Harvest Dark, MD  haloperidol decanoate (HALDOL DECANOATE) 100 MG/ML injection Inject 1 mL (100 mg total) into the muscle every 28 (twenty-eight) days. Patient not taking: Reported on 12/06/2017 09/25/17   Pucilowska, Herma Ard B, MD  HUMALOG 100 UNIT/ML injection Inject 12 Units into the skin 3 (three) times daily with meals. 12/03/17   [provider]  insulin aspart (NOVOLOG) 100 UNIT/ML FlexPen Inject 12 Units into the skin 3 (three) times daily before meals. 11/08/16   [provider]  LEVEMIR 100 UNIT/ML injection Inject 38 Units into the skin at bedtime. 12/03/17   [provider]   Allergies  Allergen Reactions  . Mushroom Extract Complex Hives, Itching  and Nausea And Vomiting  . Penicillins Anaphylaxis, Hives and Swelling    Has patient had a PCN reaction causing immediate rash, facial/tongue/throat swelling, SOB or lightheadedness with hypotension: Yes Has patient had a PCN reaction causing severe rash involving mucus membranes or skin  necrosis: No Has patient had a PCN reaction that required hospitalization Yes Has patient had a PCN reaction occurring within the last 10 years: Yes If all of the above answers are "NO", then may proceed with Cephalosporin use.  . Sulfa Antibiotics Anaphylaxis and Hives  . Clindamycin/Lincomycin Hives    FAMILY HISTORY:  Family History  Problem Relation Age of Onset  . Breast cancer Mother    SOCIAL HISTORY:  reports that he has been smoking cigarettes.  He has been smoking about 1.00 pack per day. he has never used smokeless tobacco. He reports that he uses drugs. Drug: Marijuana. He reports that he does not drink alcohol.  REVIEW OF SYSTEMS:   Constitutional: Feels well. Cardiovascular: No chest pain.  Pulmonary: Denies dyspnea.   The remainder of systems were reviewed and were found to be negative other than what is documented in the HPI.    VITAL SIGNS: Temp:  [98.2 F (36.8 C)] 98.2 F (36.8 C) (03/10 1415) Pulse Rate:  [85-116] 85 (03/10 1415) Resp:  [12-20] 12 (03/10 1415) BP: (102-130)/(67-85) 109/67 (03/10 1415) SpO2:  [98 %-100 %] 100 % (03/10 1415) Weight:  [95.6 kg (210 lb 12.2 oz)-99.8 kg (220 lb)] 95.6 kg (210 lb 12.2 oz) (03/10 1415) HEMODYNAMICS:   VENTILATOR SETTINGS:   INTAKE / OUTPUT:  Intake/Output Summary (Last 24 hours) at 12/29/2017 1454 Last data filed at 12/29/2017 1342 Gross per 24 hour  Intake 2000 ml  Output -  Net 2000 ml    Physical Examination:   VS: BP 109/67   Pulse 85   Temp 98.2 F (36.8 C) (Oral)   Resp 12   Ht 6' 2" (1.88 m)   Wt 95.6 kg (210 lb 12.2 oz)   SpO2 100%   BMI 27.06 kg/m   General Appearance: No distress  Neuro:without focal findings, mental status, speech normal,. HEENT: Poor oral dentition, flat neck veins, trachea midline, no thyromegaly Pulmonary: normal breath sounds., diaphragmatic excursion normal. Cardiovascular regular rate and rhythm  Abdomen: Benign, Soft, non-tender, No masses,  hepatosplenomegaly, No lymphadenopathy Renal:  No costovertebral tenderness  Endoc: No evident thyromegaly, no signs of acromegaly. Skin:   warm, no rashes, no ecchymosis  Extremities: normal, no cyanosis, clubbing, no edema, warm with normal capillary refill.    LABS: Reviewed   LABORATORY PANEL:   CBC Recent Labs  Lab 12/29/17 1107  WBC 18.6*  HGB 15.9  HCT 50.1  PLT 338    Chemistries  Recent Labs  Lab 12/29/17 1107  NA 136  K 4.0  CL 96*  CO2 10*  GLUCOSE 798*  BUN 37*  CREATININE 2.11*  CALCIUM 9.9    Recent Labs  Lab 12/23/17 0614 12/23/17 1137 12/29/17 1053 12/29/17 1221 12/29/17 1333 12/29/17 1419  GLUCAP 210* 227* >600* >600* 433* 366*   No results for input(s): PHART, PCO2ART, PO2ART in the last 168 hours. No results for input(s): AST, ALT, ALKPHOS, BILITOT, ALBUMIN in the last 168 hours.  Cardiac Enzymes No results for input(s): TROPONINI in the last 168 hours.  RADIOLOGY:  No results found.   Jadynn Epping DO   12/29/2017, 2:54 PM

## 2017-12-29 NOTE — ED Notes (Addendum)
Patient given [place] Mug for water per patient's request and per Dr. Derrill KayGoodman. Patient unable to void at this time.

## 2017-12-30 DIAGNOSIS — F609 Personality disorder, unspecified: Secondary | ICD-10-CM

## 2017-12-30 LAB — BASIC METABOLIC PANEL
Anion gap: 8 (ref 5–15)
Anion gap: 8 (ref 5–15)
BUN: 14 mg/dL (ref 6–20)
BUN: 11 mg/dL (ref 6–20)
CHLORIDE: 106 mmol/L (ref 101–111)
CHLORIDE: 105 mmol/L (ref 101–111)
CO2: 21 mmol/L — ABNORMAL LOW (ref 22–32)
CO2: 22 mmol/L (ref 22–32)
CREATININE: 0.83 mg/dL (ref 0.61–1.24)
Calcium: 8.4 mg/dL — ABNORMAL LOW (ref 8.9–10.3)
Calcium: 8.4 mg/dL — ABNORMAL LOW (ref 8.9–10.3)
Creatinine, Ser: 0.92 mg/dL (ref 0.61–1.24)
GFR calc Af Amer: >60 mL/min (ref >60)
GFR calc non Af Amer: >60 mL/min (ref >60)
GFR calc non Af Amer: >60 mL/min (ref >60)
GLUCOSE: 257 mg/dL — AB (ref 65–99)
Glucose, Bld: 99 mg/dL (ref 65–99)
POTASSIUM: 3.2 mmol/L — AB (ref 3.5–5.1)
POTASSIUM: 3.7 mmol/L (ref 3.5–5.1)
Sodium: 135 mmol/L (ref 135–145)
Sodium: 135 mmol/L (ref 135–145)

## 2017-12-30 LAB — URINE DRUG SCREEN, QUALITATIVE (ARMC ONLY)
AMPHETAMINES, UR SCREEN: NOT DETECTED
BENZODIAZEPINE, UR SCRN: NOT DETECTED
Barbiturates, Ur Screen: NOT DETECTED
Cannabinoid 50 Ng, Ur ~~LOC~~: POSITIVE — AB
Cocaine Metabolite,Ur ~~LOC~~: NOT DETECTED
MDMA (ECSTASY) UR SCREEN: NOT DETECTED
METHADONE SCREEN, URINE: NOT DETECTED
Opiate, Ur Screen: NOT DETECTED
PHENCYCLIDINE (PCP) UR S: NOT DETECTED
Tricyclic, Ur Screen: NOT DETECTED

## 2017-12-30 LAB — GLUCOSE, CAPILLARY
GLUCOSE-CAPILLARY: 104 mg/dL — AB (ref 65–99)
GLUCOSE-CAPILLARY: 60 mg/dL — AB (ref 65–99)
GLUCOSE-CAPILLARY: 271 mg/dL — AB (ref 65–99)
Glucose-Capillary: 121 mg/dL — ABNORMAL HIGH (ref 65–99)
Glucose-Capillary: 155 mg/dL — ABNORMAL HIGH (ref 65–99)
Glucose-Capillary: 254 mg/dL — ABNORMAL HIGH (ref 65–99)
Glucose-Capillary: 325 mg/dL — ABNORMAL HIGH (ref 65–99)

## 2017-12-30 LAB — MAGNESIUM: MAGNESIUM: 1.5 mg/dL — AB (ref 1.7–2.4)

## 2017-12-30 LAB — PHOSPHORUS: Phosphorus: 2.7 mg/dL (ref 2.5–4.6)

## 2017-12-30 MED ORDER — INSULIN ASPART 100 UNIT/ML ~~LOC~~ SOLN
0.0000 [IU] | Freq: Three times a day (TID) | SUBCUTANEOUS | Status: DC
  Administered 2017-12-30: 7 [IU] via SUBCUTANEOUS
  Administered 2017-12-31 (×2): 1 [IU] via SUBCUTANEOUS
  Filled 2017-12-30 (×3): qty 1

## 2017-12-30 MED ORDER — TRAZODONE HCL 100 MG PO TABS: 100.0000 mg | ORAL_TABLET | Freq: Every evening | ORAL | Status: DC | PRN

## 2017-12-30 MED ORDER — VENLAFAXINE HCL ER 75 MG PO CP24
300.0000 mg | ORAL_CAPSULE | Freq: Every day | ORAL | Status: DC
  Administered 2017-12-30 – 2017-12-31 (×2): 300 mg via ORAL
  Filled 2017-12-30 (×2): qty 4

## 2017-12-30 MED ORDER — GABAPENTIN 400 MG PO CAPS
1200.0000 mg | ORAL_CAPSULE | Freq: Three times a day (TID) | ORAL | Status: DC
  Administered 2017-12-30 – 2017-12-31 (×4): 1200 mg via ORAL
  Filled 2017-12-30 (×4): qty 3

## 2017-12-30 MED ORDER — POTASSIUM CHLORIDE CRYS ER 20 MEQ PO TBCR
60.0000 meq | EXTENDED_RELEASE_TABLET | Freq: Once | ORAL | Status: AC
  Administered 2017-12-30: 60 meq via ORAL
  Filled 2017-12-30: qty 3

## 2017-12-30 MED ORDER — INSULIN ASPART 100 UNIT/ML ~~LOC~~ SOLN
0.0000 [IU] | Freq: Three times a day (TID) | SUBCUTANEOUS | Status: DC
  Administered 2017-12-30 (×2): 5 [IU] via SUBCUTANEOUS
  Filled 2017-12-30 (×2): qty 1

## 2017-12-30 MED ORDER — MAGNESIUM SULFATE 2 GM/50ML IV SOLN
2.0000 g | Freq: Once | INTRAVENOUS | Status: AC
  Administered 2017-12-30: 2 g via INTRAVENOUS
  Filled 2017-12-30: qty 50

## 2017-12-30 MED ORDER — INSULIN DETEMIR 100 UNIT/ML ~~LOC~~ SOLN
35.0000 [IU] | Freq: Every day | SUBCUTANEOUS | Status: DC
  Administered 2017-12-30: 35 [IU] via SUBCUTANEOUS
  Filled 2017-12-30 (×2): qty 0.35

## 2017-12-30 MED ORDER — POTASSIUM CHLORIDE 20 MEQ PO PACK
60.0000 meq | PACK | Freq: Once | ORAL | Status: AC
  Administered 2017-12-30: 60 meq via ORAL
  Filled 2017-12-30: qty 3

## 2017-12-30 NOTE — Progress Notes (Signed)
Pt transferred to unit 2C room 227 via wheelchair.  [facility].

## 2017-12-30 NOTE — Clinical Social Work Note (Addendum)
Clinical Social Work Assessment  Patient Details  Name: Mark [name] MRN: [id number] Date of Birth: 10/21/1986  Date of referral:  12/30/17               Reason for consult:  Housing Concerns/Homelessness                Permission sought to share information with:    Permission granted to share information::     Name::        Agency::     Relationship::     Contact Information:     Housing/Transportation Living arrangements for the past 2 months:    Source of Information:  Patient Patient Interpreter Needed:  None Criminal Activity/Legal Involvement Pertinent to Current Situation/Hospitalization:  No - Comment as needed Significant Relationships:  Friend Lives with:  Friends Do you feel safe going back to the place where you live?  Yes Need for family participation in patient care:  No (Coment)  Care giving concerns:  None   Office managerocial Worker assessment / plan:  CSW received consult for homelessness. CSW reviewed medical record and patient recently [facility] where he had been for approximately 2 weeks under IVC. Patient at that time had been threatening to kill himself and had a plan. Patient was subsequently taken off of IVC and [facility] to [facility]. Patient informs CSW that he went straight there via a friend's transport and they told him the bed was already filled. Patient stayed at a friend's house after that. Patient states he has been banned from the [facility] because of his frequent suicidal ideation episodes. Patient informed CSW that he will be able to go back to his friend's house at discharge from the hospital but knows he cannot stay there long term. CSW encouraged patient to complete his medicaid application process and once he is approved for medicaid a group home can be looked into. Patient verbalized understanding.  Due to patient's flat affect and recent ED stay, CSW requested the nurse obtain an order for a psych  consult.  Employment status:  Disabled (Comment on whether or not currently receiving Disability) Insurance information:  Self Pay (Medicaid Pending) PT Recommendations:    Information / Referral to community resources:     Patient/Family's Response to care:  Patient expressed appreciation for CSW visit.  Patient/Family's Understanding of and Emotional Response to Diagnosis, Current Treatment, and Prognosis:  Patient did not present on this admission with suicidal ideation and is on medical floor due to diabetes.  Emotional Assessment Appearance:  Appears older than stated age Attitude/Demeanor/Rapport:    Affect (typically observed):  Calm, Flat Orientation:  Oriented to Self, Oriented to Place, Oriented to  Time, Oriented to Situation Alcohol / Substance use:  Not Applicable Psych involvement (Current and /or in the community):  Yes (Comment)  Discharge Needs  Concerns to be addressed:  No discharge needs identified Readmission within the last 30 days:  Yes Current discharge risk:  Homeless Barriers to Discharge:  No Barriers Identified   York SpanielMonica Thomasenia Dowse, LCSW 12/30/2017, 11:03 AM

## 2017-12-30 NOTE — Progress Notes (Signed)
Per Dr. Esaw GrandchildVachanni okay to place order for potassium tablets 60meq as pt did not drink liquid ordered this AM.

## 2017-12-30 NOTE — Progress Notes (Addendum)
Inpatient Diabetes Program Recommendations  AACE/ADA: New Consensus Statement on Inpatient Glycemic Control (2015)  Target Ranges:  Prepandial:   less than 140 mg/dL      Peak postprandial:   less than 180 mg/dL (1-2 hours)      Critically ill patients:  140 - 180 mg/dL  Results for Derek JackWILSON, Jaydian Q (MRN [id number]) as of 12/30/2017 07:44  Ref. Range 12/29/2017 21:47 12/29/2017 22:44 12/29/2017 23:47 12/30/2017 00:35 12/30/2017 04:39 12/30/2017 07:32  Glucose-Capillary Latest Ref Range: 65 - 99 mg/dL 409192 (H)  Levemir 38 units @ 21:01 194 (H) 153 (H) 155 (H)  Novolog 4 units 121 (H)  Novolog 3 units 60 (L)   Results for Derek JackWILSON, Tilmon Q (MRN [id number]) as of 12/30/2017 07:44  Ref. Range 12/29/2017 10:53 12/29/2017 12:21 12/29/2017 13:33 12/29/2017 14:19 12/29/2017 16:18 12/29/2017 17:41 12/29/2017 19:38 12/29/2017 20:41  Glucose-Capillary Latest Ref Range: 65 - 99 mg/dL >956>600 (HH) >213>600 (HH) 086433 (H) 366 (H) 229 (H) 207 (H) 246 (H) 254 (H)   Review of Glycemic Control  Outpatient Diabetes medications: Levemir 38 units QHS, Novolog 12 units TID with meals Current orders for Inpatient glycemic control: Levemir 38 units QHS, Novolog 0-20 units Q4H  Inpatient Diabetes Program Recommendations: Correction (SSI): Please consider decreasing Novolog correction scale to Novolog 0-9 units and change frequency to ACHS. Insulin - Meal Coverage: Please consider ordering Novolog 5 units TID with meals for meal coverage if patient eats at least 50% of meals.  Addendum 12/30/17@13 :24-Spoke with patient about diabetes and home regimen for diabetes control. Patient reports that he does not have any insurance and no PCP for DM management. Inquired about Medicaid (showing in chart) and patient states that he does not think he has Medicaid any longer.  Patient states that he was taking Levemir 38 units QHS and Novolog (or Humalog) 12 units TID with meals as an outpatient for diabetes control.  Patient states that the insulin he  did have was "not any good" and he will need to get prescriptions for insulin at time of discharge as well as medication assistance.  Inquired about where he would store insulin after discharge. Patient states that he will be going back to his friend's house for short term and he will be able to keep insulin refrigerated there.  Patient states that at times he has taken Novolin 70/30 (purchased over the counter) or whichever other insulins he is given by family or friends.  Patient has not been checking his glucose and he reports that he needs a battery for his glucometer. Encouraged patient to go purchase or ask a family/friend to purchase a battery for his glucometer for him.   Discussed glucose and A1C goals. Discussed importance of checking CBGs and maintaining good CBG control to prevent long-term and short-term complications. Explained how hyperglycemia leads to damage within blood vessels which lead to the common complications seen with uncontrolled diabetes. Stressed to the patient the importance of improving glycemic control to prevent further complications from uncontrolled diabetes. Encouraged patient to keep insulins stored correctly, take insulin as prescribed, and check his glucose 3-4 times per day (before meals and at bedtime). Explained that he will need to establish care with a doctor to ensure he can get his insulins as prescribed. Also explained that a doctor can help him with insulin adjustments if needed. Informed patient that Case Manager should be seeing him for medication needs and follow up. Noted patient has already been seen by Child psychotherapistocial Worker.  Patient verbalized understanding of  information discussed and he states that he has no further questions at this time related to diabetes.  NOTED Levemir was decreased from 38 to 35 units today. Glucose of 60 mg/dl this morning at 4:54. Anticipate hypoglycemia this morning due to getting Novolog correction with Resistant scale Q4H.  Recommend  increasing Levemir back up to 38 units QHS. Also, patient needs to be ordered Novolog meal coverage and Novolog bedtime correction scale.  Thanks, [name], RN, MSN, CDE Diabetes Coordinator Inpatient Diabetes Program [phone] (Team Pager from 8am to 5pm)

## 2017-12-30 NOTE — Progress Notes (Signed)
[facility] [facility] [place] [facility]   PATIENT NAME: Mark [name]    MR#:  [id number]  DATE OF BIRTH:  October 01, 1986  SUBJECTIVE:  CHIEF COMPLAINT:   Chief Complaint  Patient presents with  . Hyperglycemia    Came with DKA. Blood sugar came under control this time.  He was recently in ER in [facility] ER for depression. No complains now.  REVIEW OF SYSTEMS:  CONSTITUTIONAL: No fever, fatigue or weakness.  EYES: No blurred or double vision.  EARS, NOSE, AND THROAT: No tinnitus or ear pain.  RESPIRATORY: No cough, shortness of breath, wheezing or hemoptysis.  CARDIOVASCULAR: No chest pain, orthopnea, edema.  GASTROINTESTINAL: No nausea, vomiting, diarrhea or abdominal pain.  GENITOURINARY: No dysuria, hematuria.  ENDOCRINE: No polyuria, nocturia,  HEMATOLOGY: No anemia, easy bruising or bleeding SKIN: No rash or lesion. MUSCULOSKELETAL: No joint pain or arthritis.   NEUROLOGIC: No tingling, numbness, weakness.  PSYCHIATRY: No anxiety or depression.   ROS  DRUG ALLERGIES:   Allergies  Allergen Reactions  . Mushroom Extract Complex Hives, Itching and Nausea And Vomiting  . Penicillins Anaphylaxis, Hives and Swelling    Has patient had a PCN reaction causing immediate rash, facial/tongue/throat swelling, SOB or lightheadedness with hypotension: Yes Has patient had a PCN reaction causing severe rash involving mucus membranes or skin necrosis: No Has patient had a PCN reaction that required hospitalization Yes Has patient had a PCN reaction occurring within the last 10 years: Yes If all of the above answers are "NO", then may proceed with Cephalosporin use.  . Sulfa Antibiotics Anaphylaxis and Hives  . Clindamycin/Lincomycin Hives    VITALS:  Blood pressure 114/71, pulse 67, temperature 98.9 F (37.2 C), temperature source Oral, resp. rate 20, height 6\' 2"  (1.88 m), weight 101.4 kg (223 lb 8.7 oz), SpO2 97 %.  PHYSICAL EXAMINATION:  GENERAL:  32  y.o.-year-old patient lying in the bed with no acute distress.  EYES: Pupils equal, round, reactive to light and accommodation. No scleral icterus. Extraocular muscles intact.  HEENT: Head atraumatic, normocephalic. Oropharynx and nasopharynx clear.  NECK:  Supple, no jugular venous distention. No thyroid enlargement, no tenderness.  LUNGS: Normal breath sounds bilaterally, no wheezing, rales,rhonchi or crepitation. No use of accessory muscles of respiration.  CARDIOVASCULAR: S1, S2 normal. No murmurs, rubs, or gallops.  ABDOMEN: Soft, nontender, nondistended. Bowel sounds present. No organomegaly or mass.  EXTREMITIES: No pedal edema, cyanosis, or clubbing.  NEUROLOGIC: Cranial nerves II through XII are intact. Muscle strength 5/5 in all extremities. Sensation intact. Gait not checked.  PSYCHIATRIC: The patient is alert and oriented x 3.  SKIN: No obvious rash, lesion, or ulcer.   Physical Exam LABORATORY PANEL:   CBC Recent Labs  Lab 12/29/17 1107  WBC 18.6*  HGB 15.9  HCT 50.1  PLT 338   ------------------------------------------------------------------------------------------------------------------  Chemistries  Recent Labs  Lab 12/30/17 0502 12/30/17 1149  NA 135 135  K 3.2* 3.7  CL 106 105  CO2 21* 22  GLUCOSE 99 257*  BUN 14 11  CREATININE 0.92 0.83  CALCIUM 8.4* 8.4*  MG 1.5*  --    ------------------------------------------------------------------------------------------------------------------  Cardiac Enzymes No results for input(s): TROPONINI in the last 168 hours. ------------------------------------------------------------------------------------------------------------------  RADIOLOGY:  No results found.  ASSESSMENT AND PLAN:   Principal Problem:   Personality disorder (HCC) Active Problems:   DKA (diabetic ketoacidoses) (HCC)   Dysthymia   Major depressive disorder, recurrent severe without psychotic features (HCC)   32 year old male  patient with history of insulin-dependent diabetes mellitus presented to the emergency room with nausea, vomiting and elevated blood sugar.  1.  Diabetic ketoacidosis  IV insulin drip for blood sugar control Now improved, given 38 U lantus last night, and blood sugar was 60 this AM, decrease dose of insuline.  2.Increased anion gap metabolic acidosis IV insulin drip Improved.  3.Nausea and vomiting Antiemetics  4.  Acute renal insufficiency IV fluid hydration, Improved.  5. Depression   Psych eval.    All the records are reviewed and case discussed with Care Management/Social Workerr. Management plans discussed with the patient, family and they are in agreement.  CODE STATUS: full.  TOTAL TIME TAKING CARE OF THIS PATIENT: 35 minutes.    POSSIBLE D/C IN 1-2 DAYS, DEPENDING ON CLINICAL CONDITION.   Altamese DillingVaibhavkumar Heinrich Fertig M.D on 12/30/2017   Between 7am to 6pm - Pager - [phone]  After 6pm go to [email or web address] - password [email or web address]  [facility]  Office  [phone]  CC: Primary care physician; Patient, No Pcp Per  Note: This dictation was prepared with Dragon dictation along with smaller phrase technology. Any transcriptional errors that result from this process are unintentional.

## 2017-12-30 NOTE — Consult Note (Signed)
[facility] Consult   Reason for Consult: Consult for 32 year old man known to the psychiatric service with a history of chronic suicidal ideation Referring Physician: Marthann Schiller Patient Identification: Mark [name] MRN:  [id number] Principal Diagnosis: Personality disorder [facility]) Diagnosis:   Patient Active Problem List   Diagnosis Date Noted  . Suicidal ideation [R45.851]   . Major depressive disorder, recurrent severe without psychotic features ([place]) [F33.2]   . Adjustment disorder with depressed mood [F43.21] 12/07/2017  . Noncompliance [Z91.19] 10/17/2017  . Dysthymia [F34.1] 10/08/2017  . Personality disorder ([place]) [F60.9] 09/26/2017  . 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 Obstructive hydrocephalus [G91.1] 09/19/2017  . Diabetes ([place]) [E11.9] 09/11/2017  . Hypoglycemia [E16.2] 09/09/2017  . MDD (major depressive disorder) [F32.9] 08/22/2017  . Severe recurrent major depression with psychotic features ([place]) [F33.3] 08/19/2017  . Nausea & vomiting [R11.2] 08/18/2017  . Abdominal pain [R10.9] 08/18/2017  . Hyponatremia [E87.1] 08/18/2017  . DKA (diabetic ketoacidoses) ([place]) [E13.10] 11/24/2015  . Diabetic keto-acidosis ([place]) [E13.10] 10/23/2015    Total Time spent with patient: 1 hour  Subjective:   Mark [name] is a 32 y.o. male patient admitted with patient seen chart reviewed.  Patient known from multiple previous encounters.  32 year old man with diabetes multiple medical problems and a history of depression.  He was at the [facility] emergency room for several days and was just released a couple days ago.  At that time he was voicing suicidal ideation.  He came into our emergency room this time with primary complaint of medical issues with diabetic ketoacidosis and blood sugar almost 800.  On interview today the patient says that he could feel himself having very high blood sugars.  He was feeling sick to his stomach and exhausted and could not eat.   Patient said his mood has been low as usual but that he has been able to sleep okay and that he has not had any suicidal thoughts the last few days.  He says he is staying now with a friend and has a stable place to stay.  Denies that he has been drinking or using any drugs.  Denies any psychotic symptoms.  Social history: Sad situation of a man who has no real financial resources and has burned bridges at shelters.  Turns out that he does have a friend at least at the moment who is letting him stay with him.  Medical history: Diabetes insulin-dependent prone to frequent episodes of diabetic ketoacidosis.  Also history of hydrocephalus.  Substance abuse history: Not drinking not abusing any drugs.  HPI: Patient has a history as noted above.  Long history of multiple psychiatric visits.  Has threatened suicide many times in the past.  Frequently has threatened suicide only to become uncooperative and show little benefit from psychiatric treatment.  Has many features of dependent personality disorder.  He does have a past history of suicide attempts as well.  Past Psychiatric History: See past history and the note above  Risk to Self: Is patient at risk for suicide?: No Risk to Others:   Prior Inpatient Therapy:   Prior Outpatient Therapy:    Past Medical History:  Past Medical History:  Diagnosis Date  . Depression   . Diabetes mellitus without complication ([place])   . Hydrocephalus   . Kidney stones     Past Surgical History:  Procedure Laterality Date  . KIDNEY STONE SURGERY     Family History:  Family History  Problem Relation Age of Onset  .  Breast cancer Mother    Family Psychiatric  History: None Social History:  Social History   Substance and Sexual Activity  Alcohol Use No   Comment: occassional      Social History   Substance and Sexual Activity  Drug Use Yes  . Types: Marijuana   Comment: ''I haven't used in over a month"    Social History   Socioeconomic  History  . Marital status: Single    Spouse name: None  . Number of children: None  . Years of education: None  . Highest education level: None  Social Needs  . Financial resource strain: None  . Food insecurity - worry: None  . Food insecurity - inability: None  . Transportation needs - medical: None  . Transportation needs - non-medical: None  Occupational History  . None  Tobacco Use  . Smoking status: Current Every Day Smoker    Packs/day: 1.00    Types: Cigarettes  . Smokeless tobacco: Never Used  Substance and Sexual Activity  . Alcohol use: No    Comment: occassional   . Drug use: Yes    Types: Marijuana    Comment: ''I haven't used in over a month"  . Sexual activity: No  Other Topics Concern  . None  Social History Narrative  . None   Additional Social History:    Allergies:   Allergies  Allergen Reactions  . Mushroom Extract Complex Hives, Itching and Nausea And Vomiting  . Penicillins Anaphylaxis, Hives and Swelling    Has patient had a PCN reaction causing immediate rash, facial/tongue/throat swelling, SOB or lightheadedness with hypotension: Yes Has patient had a PCN reaction causing severe rash involving mucus membranes or skin necrosis: No Has patient had a PCN reaction that required hospitalization Yes Has patient had a PCN reaction occurring within the last 10 years: Yes If all of the above answers are "NO", then may proceed with Cephalosporin use.  . Sulfa Antibiotics Anaphylaxis and Hives  . Clindamycin/Lincomycin Hives    Labs:  Results for orders placed or performed during the hospital encounter of 12/29/17 (from the past 48 hour(s))  Glucose, capillary     Status: Abnormal   Collection Time: 12/29/17 10:53 AM  Result Value Ref Range   Glucose-Capillary >600 (HH) 65 - 99 mg/dL  Basic metabolic panel     Status: Abnormal   Collection Time: 12/29/17 11:07 AM  Result Value Ref Range   Sodium 136 135 - 145 mmol/L    Comment: LYTES REPEATED,  SNJ   Potassium 4.0 3.5 - 5.1 mmol/L   Chloride 96 (L) 101 - 111 mmol/L   CO2 10 (L) 22 - 32 mmol/L   Glucose, Bld 798 (HH) 65 - 99 mg/dL    Comment: CRITICAL RESULT CALLED TO, READ BACK BY AND VERIFIED WITH SONJIA WEAVER AT 1137 ON 12/29/17 BY SNJ    BUN 37 (H) 6 - 20 mg/dL   Creatinine, Ser 2.11 (H) 0.61 - 1.24 mg/dL   Calcium 9.9 8.9 - 10.3 mg/dL   GFR calc non Af Amer 39 (L) >60 mL/min   GFR calc Af Amer 45 (L) >60 mL/min    Comment: (NOTE) The eGFR has been calculated using the CKD EPI equation. This calculation has not been validated in all clinical situations. eGFR's persistently <60 mL/min signify possible Chronic Kidney Disease.    Anion gap 30 (H) 5 - 15    Comment: Performed at [facility], [address] [place], [place] [address]  CBC     Status: Abnormal   Collection Time: 12/29/17 11:07 AM  Result Value Ref Range   WBC 18.6 (H) 3.8 - 10.6 K/uL   RBC 5.15 4.40 - 5.90 MIL/uL   Hemoglobin 15.9 13.0 - 18.0 g/dL   HCT 50.1 40.0 - 52.0 %   MCV 97.2 80.0 - 100.0 fL   MCH 30.9 26.0 - 34.0 pg   MCHC 31.7 (L) 32.0 - 36.0 g/dL   RDW 13.8 11.5 - 14.5 %   Platelets 338 150 - 440 K/uL    Comment: Performed at [facility], [address] [place], [place] [address]  Urinalysis, Complete w Microscopic     Status: Abnormal   Collection Time: 12/29/17 11:45 AM  Result Value Ref Range   Color, Urine STRAW (A) YELLOW   APPearance CLEAR (A) CLEAR   Specific Gravity, Urine 1.021 1.005 - 1.030   pH 5.0 5.0 - 8.0   Glucose, UA >=500 (A) NEGATIVE mg/dL   Hgb urine dipstick MODERATE (A) NEGATIVE   Bilirubin Urine NEGATIVE NEGATIVE   Ketones, ur 80 (A) NEGATIVE mg/dL   Protein, ur NEGATIVE NEGATIVE mg/dL   Nitrite NEGATIVE NEGATIVE   Leukocytes, UA NEGATIVE NEGATIVE   RBC / HPF 0-5 0 - 5 RBC/hpf   WBC, UA 0-5 0 - 5 WBC/hpf   Bacteria, UA NONE SEEN NONE SEEN   Squamous Epithelial / LPF NONE SEEN NONE SEEN   Mucus PRESENT     Comment: Performed at [facility], [address] [place], [place] [address]  Glucose, capillary     Status: Abnormal   Collection Time: 12/29/17 12:21 PM  Result Value Ref Range   Glucose-Capillary >600 (HH) 65 - 99 mg/dL   Comment 1 Document in Chart    Comment 2 Call MD NNP PA CNM   Glucose, capillary     Status: Abnormal   Collection Time: 12/29/17  1:33 PM  Result Value Ref Range   Glucose-Capillary 433 (H) 65 - 99 mg/dL  MRSA PCR Screening     Status: None   Collection Time: 12/29/17  2:18 PM  Result Value Ref Range   MRSA by PCR NEGATIVE NEGATIVE    Comment:        The GeneXpert MRSA Assay (FDA approved for NASAL specimens only), is one component of a comprehensive MRSA colonization surveillance program. It is not intended to diagnose MRSA infection nor to guide or monitor treatment for MRSA infections. Performed at [facility], [place],  [address]   Glucose, capillary     Status: Abnormal   Collection Time: 12/29/17  2:19 PM  Result Value Ref Range   Glucose-Capillary 366 (H) 65 - 99 mg/dL  Basic metabolic panel     Status: Abnormal   Collection Time: 12/29/17  3:07 PM  Result Value Ref Range   Sodium 134 (L) 135 - 145 mmol/L   Potassium 4.2 3.5 - 5.1 mmol/L   Chloride 100 (L) 101 - 111 mmol/L   CO2 17 (L) 22 - 32 mmol/L   Glucose, Bld 396 (H) 65 - 99 mg/dL   BUN 29 (H) 6 - 20 mg/dL   Creatinine, Ser 1.49 (H) 0.61 - 1.24 mg/dL   Calcium 9.1 8.9 - 10.3 mg/dL   GFR calc non Af Amer 60 (L) >60 mL/min   GFR calc Af Amer >60 >60 mL/min    Comment: (NOTE) The eGFR has been calculated using the CKD EPI equation. This  calculation has not been validated in all clinical situations. eGFR's persistently <60 mL/min signify possible Chronic Kidney Disease.    Anion gap 17 (H) 5 - 15    Comment: Performed at [facility], [address] [place], [place] [address]  Glucose, capillary     Status: Abnormal   Collection Time: 12/29/17  4:18 PM   Result Value Ref Range   Glucose-Capillary 229 (H) 65 - 99 mg/dL  Glucose, capillary     Status: Abnormal   Collection Time: 12/29/17  5:41 PM  Result Value Ref Range   Glucose-Capillary 207 (H) 65 - 99 mg/dL  Basic metabolic panel     Status: Abnormal   Collection Time: 12/29/17  6:41 PM  Result Value Ref Range   Sodium 137 135 - 145 mmol/L   Potassium 3.7 3.5 - 5.1 mmol/L   Chloride 103 101 - 111 mmol/L   CO2 21 (L) 22 - 32 mmol/L   Glucose, Bld 204 (H) 65 - 99 mg/dL   BUN 22 (H) 6 - 20 mg/dL   Creatinine, Ser 1.17 0.61 - 1.24 mg/dL   Calcium 9.1 8.9 - 10.3 mg/dL   GFR calc non Af Amer >60 >60 mL/min   GFR calc Af Amer >60 >60 mL/min    Comment: (NOTE) The eGFR has been calculated using the CKD EPI equation. This calculation has not been validated in all clinical situations. eGFR's persistently <60 mL/min signify possible Chronic Kidney Disease.    Anion gap 13 5 - 15    Comment: Performed at [facility], [address] [place], [place] [address]  Glucose, capillary     Status: Abnormal   Collection Time: 12/29/17  7:38 PM  Result Value Ref Range   Glucose-Capillary 246 (H) 65 - 99 mg/dL  Glucose, capillary     Status: Abnormal   Collection Time: 12/29/17  8:41 PM  Result Value Ref Range   Glucose-Capillary 254 (H) 65 - 99 mg/dL  Glucose, capillary     Status: Abnormal   Collection Time: 12/29/17  9:47 PM  Result Value Ref Range   Glucose-Capillary 192 (H) 65 - 99 mg/dL  Glucose, capillary     Status: Abnormal   Collection Time: 12/29/17 10:44 PM  Result Value Ref Range   Glucose-Capillary 194 (H) 65 - 99 mg/dL  Glucose, capillary     Status: Abnormal   Collection Time: 12/29/17 11:47 PM  Result Value Ref Range   Glucose-Capillary 153 (H) 65 - 99 mg/dL  Urine Drug Screen, Qualitative ([facility] only)     Status: Abnormal   Collection Time: 12/30/17 12:06 AM  Result Value Ref Range   Tricyclic, Ur Screen NONE DETECTED NONE DETECTED   Amphetamines, Ur  Screen NONE DETECTED NONE DETECTED   MDMA (Ecstasy)Ur Screen NONE DETECTED NONE DETECTED   Cocaine Metabolite,Ur [place] NONE DETECTED NONE DETECTED   Opiate, Ur Screen NONE DETECTED NONE DETECTED   Phencyclidine (PCP) Ur S NONE DETECTED NONE DETECTED   Cannabinoid 50 Ng, Ur [place] POSITIVE (A) NONE DETECTED   Barbiturates, Ur Screen NONE DETECTED NONE DETECTED   Benzodiazepine, Ur Scrn NONE DETECTED NONE DETECTED   Methadone Scn, Ur NONE DETECTED NONE DETECTED    Comment: (NOTE) Tricyclics + metabolites, urine    Cutoff 1000 ng/mL Amphetamines + metabolites, urine  Cutoff 1000 ng/mL MDMA (Ecstasy), urine              Cutoff 500 ng/mL Cocaine Metabolite, urine  Cutoff 300 ng/mL Opiate + metabolites, urine        Cutoff 300 ng/mL Phencyclidine (PCP), urine         Cutoff 25 ng/mL Cannabinoid, urine                 Cutoff 50 ng/mL Barbiturates + metabolites, urine  Cutoff 200 ng/mL Benzodiazepine, urine              Cutoff 200 ng/mL Methadone, urine                   Cutoff 300 ng/mL The urine drug screen provides only a preliminary, unconfirmed analytical test result and should not be used for non-medical purposes. Clinical consideration and professional judgment should be applied to any positive drug screen result due to possible interfering substances. A more specific alternate chemical method must be used in order to obtain a confirmed analytical result. Gas chromatography / mass spectrometry (GC/MS) is the preferred confirmat ory method. Performed at [facility], [address] [place], [place] [address]   Glucose, capillary     Status: Abnormal   Collection Time: 12/30/17 12:35 AM  Result Value Ref Range   Glucose-Capillary 155 (H) 65 - 99 mg/dL  Glucose, capillary     Status: Abnormal   Collection Time: 12/30/17  4:39 AM  Result Value Ref Range   Glucose-Capillary 121 (H) 65 - 99 mg/dL  Basic metabolic panel     Status: Abnormal   Collection Time: 12/30/17   5:02 AM  Result Value Ref Range   Sodium 135 135 - 145 mmol/L   Potassium 3.2 (L) 3.5 - 5.1 mmol/L   Chloride 106 101 - 111 mmol/L   CO2 21 (L) 22 - 32 mmol/L   Glucose, Bld 99 65 - 99 mg/dL   BUN 14 6 - 20 mg/dL   Creatinine, Ser 0.92 0.61 - 1.24 mg/dL   Calcium 8.4 (L) 8.9 - 10.3 mg/dL   GFR calc non Af Amer >60 >60 mL/min   GFR calc Af Amer >60 >60 mL/min    Comment: (NOTE) The eGFR has been calculated using the CKD EPI equation. This calculation has not been validated in all clinical situations. eGFR's persistently <60 mL/min signify possible Chronic Kidney Disease.    Anion gap 8 5 - 15    Comment: Performed at [facility], [address] [place], [place] [address]  Magnesium     Status: Abnormal   Collection Time: 12/30/17  5:02 AM  Result Value Ref Range   Magnesium 1.5 (L) 1.7 - 2.4 mg/dL    Comment: Performed at [facility], [address] [place], [place] [address]  Phosphorus     Status: None   Collection Time: 12/30/17  5:02 AM  Result Value Ref Range   Phosphorus 2.7 2.5 - 4.6 mg/dL    Comment: Performed at [facility], [address] [place], [place] [address]  Glucose, capillary     Status: Abnormal   Collection Time: 12/30/17  7:32 AM  Result Value Ref Range   Glucose-Capillary 60 (L) 65 - 99 mg/dL  Glucose, capillary     Status: Abnormal   Collection Time: 12/30/17  8:10 AM  Result Value Ref Range   Glucose-Capillary 104 (H) 65 - 99 mg/dL  Glucose, capillary     Status: Abnormal   Collection Time: 12/30/17 11:32 AM  Result Value Ref Range   Glucose-Capillary 271 (H) 65 - 99 mg/dL  Basic metabolic  panel     Status: Abnormal   Collection Time: 12/30/17 11:49 AM  Result Value Ref Range   Sodium 135 135 - 145 mmol/L   Potassium 3.7 3.5 - 5.1 mmol/L   Chloride 105 101 - 111 mmol/L   CO2 22 22 - 32 mmol/L   Glucose, Bld 257 (H) 65 - 99 mg/dL   BUN 11 6 - 20 mg/dL   Creatinine, Ser 0.83 0.61 - 1.24 mg/dL   Calcium 8.4  (L) 8.9 - 10.3 mg/dL   GFR calc non Af Amer >60 >60 mL/min   GFR calc Af Amer >60 >60 mL/min    Comment: (NOTE) The eGFR has been calculated using the CKD EPI equation. This calculation has not been validated in all clinical situations. eGFR's persistently <60 mL/min signify possible Chronic Kidney Disease.    Anion gap 8 5 - 15    Comment: Performed at [facility], [address] [place], [place] [address]  Glucose, capillary     Status: Abnormal   Collection Time: 12/30/17  4:43 PM  Result Value Ref Range   Glucose-Capillary 254 (H) 65 - 99 mg/dL    Current Facility-Administered Medications  Medication Dose Route Frequency Provider Last Rate Last Dose  . 0.9 %  sodium chloride infusion   Intravenous Continuous Mikael Spray, NP 125 mL/hr at 12/30/17 1347    . gabapentin (NEURONTIN) capsule 1,200 mg  1,200 mg Oral TID Vaughan Basta, MD   1,200 mg at 12/30/17 1656  . insulin aspart (novoLOG) injection 0-9 Units  0-9 Units Subcutaneous TID WC Vaughan Basta, MD   5 Units at 12/30/17 1656  . insulin detemir (LEVEMIR) injection 35 Units  35 Units Subcutaneous QHS Vaughan Basta, MD      . traZODone (DESYREL) tablet 100 mg  100 mg Oral QHS PRN Vaughan Basta, MD      . venlafaxine XR (EFFEXOR-XR) 24 hr capsule 300 mg  300 mg Oral Q breakfast Vaughan Basta, MD   300 mg at 12/30/17 1656    Musculoskeletal: Strength & Muscle Tone: within normal limits Gait & Station: normal Patient leans: N/A  Psychiatric Specialty Exam: Physical Exam  Nursing note and vitals reviewed. Constitutional: He appears well-developed and well-nourished.  HENT:  Head: Normocephalic and atraumatic.  Eyes: Conjunctivae are normal. Pupils are equal, round, and reactive to light.  Neck: Normal range of motion.  Cardiovascular: Regular rhythm and normal heart sounds.  Respiratory: Effort normal. No respiratory distress.  GI: Soft.  Musculoskeletal:  Normal range of motion.  Neurological: He is alert.  Skin: Skin is warm and dry.  Psychiatric: His affect is blunt. His speech is delayed. He is slowed. Thought content is not paranoid. Cognition and memory are normal. He expresses impulsivity. He expresses no homicidal and no suicidal ideation.    Review of Systems  Constitutional: Negative.   HENT: Negative.   Eyes: Negative.   Respiratory: Negative.   Cardiovascular: Negative.   Gastrointestinal: Positive for abdominal pain and nausea.  Musculoskeletal: Negative.   Skin: Negative.   Neurological: Negative.   Psychiatric/Behavioral: Positive for depression. Negative for hallucinations, memory loss, substance abuse and suicidal ideas. The patient is not nervous/anxious and does not have insomnia.     Blood pressure 108/62, pulse 62, temperature 98 F (36.7 C), temperature source Oral, resp. rate 18, height _0  (1.88 m), weight 101.4 kg (223 lb 8.7 oz), SpO2 100 %.Body mass index is 28.7 kg/m.  General Appearance: Disheveled  Eye Contact:  Fair  Speech:  Normal Rate  Volume:  Normal  Mood:  Dysphoric  Affect:  Congruent  Thought Process:  Goal Directed  Orientation:  Full (Time, Place, and Person)  Thought Content:  Logical  Suicidal Thoughts:  No  Homicidal Thoughts:  No  Memory:  Immediate;   Fair Recent;   Fair Remote;   Fair  Judgement:  Fair  Insight:  Fair  Psychomotor Activity:  Normal  Concentration:  Concentration: Fair  Recall:  [employer] of Knowledge:  Fair  Language:  Fair  Akathisia:  No  Handed:  Right  AIMS (if indicated):     Assets:  Communication Skills Housing Resilience  ADL's:  Intact  Cognition:  Impaired,  Mild  Sleep:        Treatment Plan Summary: Daily contact with patient to assess and evaluate symptoms and progress in treatment, Medication management and Plan The patient with a history of chronic recurrent depression.  To my examination today he appears to be as good or better than  I have ever seen him before.  He is denying any suicidal ideation.  He is cooperative with treatment.  He appears to be lucid and was interacting in a mature and appropriate manner.  Does not appear to need psychiatric hospitalization.  I agree with continuing the Effexor as currently prescribed.  If he is going to be staying in town he definitely should be following up with [facility] which he has been referred to in the past.  I will follow up just as needed but no medicine change for now.  Disposition: No evidence of imminent risk to self or others at present.   Patient does not meet criteria for psychiatric inpatient admission. Supportive therapy provided about ongoing stressors.  Alethia Berthold, MD 12/30/2017 8:08 PM

## 2017-12-31 LAB — GLUCOSE, CAPILLARY
Glucose-Capillary: 175 mg/dL — ABNORMAL HIGH (ref 65–99)
Glucose-Capillary: 147 mg/dL — ABNORMAL HIGH (ref 65–99)
Glucose-Capillary: 137 mg/dL — ABNORMAL HIGH (ref 65–99)

## 2017-12-31 MED ORDER — INSULIN DETEMIR 100 UNIT/ML FLEXPEN: 38.0000 [IU] | Freq: Every day | SUBCUTANEOUS | 0 refills | Status: DC

## 2017-12-31 MED ORDER — GABAPENTIN 400 MG PO CAPS: 1200.0000 mg | ORAL_CAPSULE | Freq: Three times a day (TID) | ORAL | 0 refills | Status: DC

## 2017-12-31 MED ORDER — INSULIN PEN NEEDLE 30G X 8 MM MISC: 1.0000 | 0 refills | Status: DC | PRN

## 2017-12-31 MED ORDER — INSULIN ASPART 100 UNIT/ML FLEXPEN: 20.0000 [IU] | PEN_INJECTOR | Freq: Three times a day (TID) | SUBCUTANEOUS | 0 refills | Status: DC

## 2017-12-31 NOTE — Discharge Instructions (Signed)
Diabetic diet  Activity as before

## 2017-12-31 NOTE — Progress Notes (Signed)
Inpatient Diabetes Program Recommendations  AACE/ADA: New Consensus Statement on Inpatient Glycemic Control (2015)  Target Ranges:  Prepandial:   less than 140 mg/dL      Peak postprandial:   less than 180 mg/dL (1-2 hours)      Critically ill patients:  140 - 180 mg/dL  Results for Mark [name], [place] Q (MRN [id number]) as of 12/31/2017 07:32  Ref. Range 12/30/2017 08:10 12/30/2017 11:32 12/30/2017 16:43 12/30/2017 21:07 12/31/2017 02:49  Glucose-Capillary Latest Ref Range: 65 - 99 mg/dL 409104 (H) 811271 (H) 914254 (H) 325 (H) 175 (H)    Review of Glycemic Control  Outpatient Diabetes medications: Levemir 38 units QHS, Novolog 12 units TID with meals Current orders for Inpatient glycemic control: Levemir 35 units QHS, Novolog 0-9 units ACHS  Inpatient Diabetes Program Recommendations: Insulin-Basal: Please consider increasing Levemir to 38 units QHS. Insulin - Meal Coverage: Post prandial glucose is consistently elevated. Please consider ordering Novolog 6 units TID with meals for meal coverage if patient eats at least 50% of meals.  Thanks, Orlando PennerMarie Izan Miron, RN, MSN, CDE Diabetes Coordinator Inpatient Diabetes Program [phone] (Team Pager from 8am to 5pm)

## 2017-12-31 NOTE — Progress Notes (Signed)
Mark [name]  A and O x 4. VSS. Pt tolerating diet well. No complaints of pain or nausea. IV removed intact, prescriptions given. Insulins retrieved from medication management. Pt voiced understanding of discharge instructions with no further questions. Pt discharged via wheelchair with nurse aide.  Lynann Bologna MSN, RN-BC  Allergies as of 12/31/2017      Reactions   Mushroom Extract Complex Hives, Itching, Nausea And Vomiting   Penicillins Anaphylaxis, Hives, Swelling   Has patient had a PCN reaction causing immediate rash, facial/tongue/throat swelling, SOB or lightheadedness with hypotension: Yes Has patient had a PCN reaction causing severe rash involving mucus membranes or skin necrosis: No Has patient had a PCN reaction that required hospitalization Yes Has patient had a PCN reaction occurring within the last 10 years: Yes If all of the above answers are "NO", then may proceed with Cephalosporin use.   Sulfa Antibiotics Anaphylaxis, Hives   Clindamycin/lincomycin Hives      Medication List    STOP taking these medications   HUMALOG 100 UNIT/ML injection Generic drug:  insulin lispro     TAKE these medications   blood glucose meter kit and supplies Kit Dispense based on patient and insurance preference. Use up to four times daily as directed. (FOR ICD-9 250.00, 250.01).   diphenhydramine-acetaminophen 25-500 MG Tabs tablet Commonly known as:  TYLENOL PM Take 2 tablets by mouth at bedtime as needed (for sleep).   gabapentin 400 MG capsule Commonly known as:  NEURONTIN Take 3 capsules (1,200 mg total) by mouth 3 (three) times daily.   haloperidol decanoate 100 MG/ML injection Commonly known as:  HALDOL DECANOATE Inject 1 mL (100 mg total) into the muscle every 28 (twenty-eight) days.   insulin aspart 100 UNIT/ML FlexPen Commonly known as:  NOVOLOG Inject 20 Units into the skin 3 (three) times daily before meals. What changed:  how much to take   insulin detemir 100  unit/ml Soln Commonly known as:  LEVEMIR Inject 0.38 mLs (38 Units total) into the skin at bedtime. What changed:  medication strength   Insulin Pen Needle 30G X 8 MM Misc Commonly known as:  NOVOFINE Inject 10 each into the skin as needed.   traZODone 100 MG tablet Commonly known as:  DESYREL Take 1 tablet (100 mg total) by mouth at bedtime as needed for sleep.   venlafaxine XR 150 MG 24 hr capsule Commonly known as:  EFFEXOR-XR Take 2 capsules (300 mg total) by mouth daily with breakfast.       Vitals:   12/31/17 0848 12/31/17 1427  BP: 125/82 128/82  Pulse: 67 63  Resp:    Temp: 97.8 F (36.6 C) 98.4 F (36.9 C)  SpO2: 98% 98%

## 2017-12-31 NOTE — Care Management (Signed)
Patient to discharge today.  Patient will be staying at a friends house after discharge.  States he has been staying there a week.  Patient [facility]  Patient has failed to complete application and provide financial information to either Medication Management  Or [facility].  RNCM confirmed with Medication Management  That patient did pick up his insulin in December of 2018, and again did not complete required information.  They will provide his insulin at discharge.  Patient confirms he has his glucometer.  Patient provided with another application to Medication Management  And [facility].  RNCM encouraged patient to complete required documentation.  RNCM informed patient that if he has free access to medical care, and free access to his insulin he needs to utilizes these resources/be accountable for his part,  rather then not following up with MD, running out of insulin, and presenting to ED.  RNCM signing off.

## 2018-01-02 NOTE — Discharge Summary (Signed)
[facility] [place] NAME: Mark [name]    MR#:  [id number]  DATE OF BIRTH:  [birth year]  DATE OF ADMISSION:  12/29/2017 ADMITTING PHYSICIAN: Saundra Shelling, MD  DATE OF DISCHARGE: 12/31/2017  4:25 PM  PRIMARY CARE PHYSICIAN: Patient, No Pcp Per   ADMISSION DIAGNOSIS:  Diabetic ketoacidosis without coma associated with other specified diabetes mellitus ([place]) [E13.10]  DISCHARGE DIAGNOSIS:  Principal Problem:   Personality disorder ([place]) Active Problems:   DKA (diabetic ketoacidoses) ([place])   Dysthymia   Major depressive disorder, recurrent severe without psychotic features ([place])   SECONDARY DIAGNOSIS:   Past Medical History:  Diagnosis Date  . Depression   . Diabetes mellitus without complication ([place])   . Hydrocephalus   . Kidney stones      ADMITTING HISTORY  HISTORY OF PRESENT ILLNESS: Mark [name]  is a 32 y.o. male with a known history of diabetes mellitus insulin-dependent, nephrolithiasis presented to the emergency room with elevated blood sugar.  The blood sugar was 798 mg/dL.  Patient also had some nausea and vomitings.  Patient says he has tried the insulin medication at home but says he has been in and out of hospital and hence the dose was inadequate.  Has polyuria and polyphagia.  He was started on IV insulin drip in the emergency room for control of blood sugars.  No complaints of any chest pain, shortness of breath.  HOSPITAL COURSE:   *Uncontrolled diabetes mellitus with hyperglycemia and DKA.  Patient ran out of his insulin medication.  He was admitted to ICU on DKA protocol with IV fluids and insulin.  Patient improved well and was transitioned to Lantus along with sliding scale insulin.  Blood sugars are much improved by day of discharge.  Patient is tolerating diet. He is given prescriptions for his Lantus and NovoLog which are being filled at the med management clinic.  *Nausea and vomiting due to DKA have  resolved.  *Chronic depression.  Psychiatry has seen the patient.  No suicidal ideation.  Did not need any inpatient psychiatric treatment.  Home medications were continued.  Patient stable.  Patient is being discharged home in stable condition [facility]  CONSULTS OBTAINED:  Treatment Team:  Hermelinda Dellen, DO Clapacs, Madie Reno, MD  DRUG ALLERGIES:   Allergies  Allergen Reactions  . Mushroom Extract Complex Hives, Itching and Nausea And Vomiting  . Penicillins Anaphylaxis, Hives and Swelling    Has patient had a PCN reaction causing immediate rash, facial/tongue/throat swelling, SOB or lightheadedness with hypotension: Yes Has patient had a PCN reaction causing severe rash involving mucus membranes or skin necrosis: No Has patient had a PCN reaction that required hospitalization Yes Has patient had a PCN reaction occurring within the last 10 years: Yes If all of the above answers are "NO", then may proceed with Cephalosporin use.  . Sulfa Antibiotics Anaphylaxis and Hives  . Clindamycin/Lincomycin Hives    DISCHARGE MEDICATIONS:   Allergies as of 12/31/2017      Reactions   Mushroom Extract Complex Hives, Itching, Nausea And Vomiting   Penicillins Anaphylaxis, Hives, Swelling   Has patient had a PCN reaction causing immediate rash, facial/tongue/throat swelling, SOB or lightheadedness with hypotension: Yes Has patient had a PCN reaction causing severe rash involving mucus membranes or skin necrosis: No Has patient had a PCN reaction that required hospitalization Yes Has patient had a PCN reaction occurring within the last 10 years: Yes If all  of the above answers are "NO", then may proceed with Cephalosporin use.   Sulfa Antibiotics Anaphylaxis, Hives   Clindamycin/lincomycin Hives      Medication List    STOP taking these medications   HUMALOG 100 UNIT/ML injection Generic drug:  insulin lispro     TAKE these medications   blood glucose meter  kit and supplies Kit Dispense based on patient and insurance preference. Use up to four times daily as directed. (FOR ICD-9 250.00, 250.01).   diphenhydramine-acetaminophen 25-500 MG Tabs tablet Commonly known as:  TYLENOL PM Take 2 tablets by mouth at bedtime as needed (for sleep).   gabapentin 400 MG capsule Commonly known as:  NEURONTIN Take 3 capsules (1,200 mg total) by mouth 3 (three) times daily.   haloperidol decanoate 100 MG/ML injection Commonly known as:  HALDOL DECANOATE Inject 1 mL (100 mg total) into the muscle every 28 (twenty-eight) days.   insulin aspart 100 UNIT/ML FlexPen Commonly known as:  NOVOLOG Inject 20 Units into the skin 3 (three) times daily before meals. What changed:  how much to take   insulin detemir 100 unit/ml Soln Commonly known as:  LEVEMIR Inject 0.38 mLs (38 Units total) into the skin at bedtime. What changed:  medication strength   Insulin Pen Needle 30G X 8 MM Misc Commonly known as:  NOVOFINE Inject 10 each into the skin as needed.   traZODone 100 MG tablet Commonly known as:  DESYREL Take 1 tablet (100 mg total) by mouth at bedtime as needed for sleep.   venlafaxine XR 150 MG 24 hr capsule Commonly known as:  EFFEXOR-XR Take 2 capsules (300 mg total) by mouth daily with breakfast.       Today   VITAL SIGNS:  Blood pressure 128/82, pulse 63, temperature 98.4 F (36.9 C), temperature source Oral, resp. rate 18, height 6' 2"  (1.88 m), weight 101.4 kg (223 lb 8.7 oz), SpO2 98 %.  I/O:  No intake or output data in the 24 hours ending 01/02/18 1134  PHYSICAL EXAMINATION:  Physical Exam  GENERAL:  32 y.o.-year-old patient lying in the bed with no acute distress.  LUNGS: Normal breath sounds bilaterally, no wheezing, rales,rhonchi or crepitation. No use of accessory muscles of respiration.  CARDIOVASCULAR: S1, S2 normal. No murmurs, rubs, or gallops.  ABDOMEN: Soft, non-tender, non-distended. Bowel sounds present. No  organomegaly or mass.  NEUROLOGIC: Moves all 4 extremities. PSYCHIATRIC: The patient is alert and oriented x 3.  SKIN: No obvious rash, lesion, or ulcer.   DATA REVIEW:   CBC Recent Labs  Lab 12/29/17 1107  WBC 18.6*  HGB 15.9  HCT 50.1  PLT 338    Chemistries  Recent Labs  Lab 12/30/17 0502 12/30/17 1149  NA 135 135  K 3.2* 3.7  CL 106 105  CO2 21* 22  GLUCOSE 99 257*  BUN 14 11  CREATININE 0.92 0.83  CALCIUM 8.4* 8.4*  MG 1.5*  --     Cardiac Enzymes No results for input(s): TROPONINI in the last 168 hours.  Microbiology Results  Results for orders placed or performed during the hospital encounter of 12/29/17  MRSA PCR Screening     Status: None   Collection Time: 12/29/17  2:18 PM  Result Value Ref Range Status   MRSA by PCR NEGATIVE NEGATIVE Final    Comment:        The GeneXpert MRSA Assay (FDA approved for NASAL specimens only), is one component of a comprehensive MRSA colonization surveillance program.  It is not intended to diagnose MRSA infection nor to guide or monitor treatment for MRSA infections. Performed at [facility], [address], [place], [place] [address]     RADIOLOGY:  No results found.  Follow up with PCP in 1 week.  Management plans discussed with the patient, family and they are in agreement.  CODE STATUS:  Code Status History    Date Active Date Inactive Code Status Order ID Comments User Context   12/29/2017 14:15 12/31/2017 19:30 Full Code [id number]  Saundra Shelling, MD Inpatient   12/06/2017 14:19 12/07/2017 14:44 Full Code [id number]  Domenic Moras, PA-C ED   09/10/2017 22:30 09/26/2017 15:54 Full Code [id number]  Gonzella Lex, MD Inpatient   09/10/2017 22:30 09/10/2017 22:30 Full Code [id number]  Gonzella Lex, MD Inpatient   09/09/2017 20:04 09/10/2017 21:53 Full Code [id number]  Gorden Harms, MD Inpatient   08/22/2017 16:05 08/29/2017 16:13 Full Code [id number]  Marylin Crosby, MD Inpatient   08/22/2017  15:18 08/22/2017 16:05 Full Code [id number]  Gonzella Lex, MD Inpatient   08/22/2017 15:18 08/22/2017 15:18 Full Code [id number]  Gonzella Lex, MD Inpatient   08/18/2017 17:25 08/22/2017 15:15 Full Code [id number]  Idelle Crouch, MD Inpatient   11/24/2015 13:56 11/25/2015 18:28 Full Code [id number]  Bettey Costa, MD Inpatient   10/23/2015 10:12 10/24/2015 20:27 Full Code [id number]  Loletha Grayer, MD ED      TOTAL TIME TAKING CARE OF THIS PATIENT ON DAY OF DISCHARGE: more than 30 minutes.   Neita Carp M.D on 01/02/2018 at 11:34 AM  Between 7am to 6pm - Pager - [phone]  After 6pm go to [email or web address] - password [email or web address] [facility]  Office  [phone]  CC: Primary care physician; Patient, No Pcp Per  Note: This dictation was prepared with Dragon dictation along with smaller phrase technology. Any transcriptional errors that result from this process are unintentional.

## 2018-01-26 ENCOUNTER — Encounter: Payer: Self-pay | Admitting: Emergency Medicine

## 2018-01-26 ENCOUNTER — Emergency Department
Admission: EM | Admit: 2018-01-26 | Discharge: 2018-01-26 | Disposition: A | Discharge status: Home / Self Care | Payer: Self-pay | Attending: Emergency Medicine | Admitting: Emergency Medicine

## 2018-01-26 DIAGNOSIS — Z79899 Other long term (current) drug therapy: Secondary | ICD-10-CM | POA: Insufficient documentation

## 2018-01-26 DIAGNOSIS — R45851 Suicidal ideations: Secondary | ICD-10-CM | POA: Insufficient documentation

## 2018-01-26 DIAGNOSIS — F17210 Nicotine dependence, cigarettes, uncomplicated: Secondary | ICD-10-CM | POA: Insufficient documentation

## 2018-01-26 DIAGNOSIS — Z794 Long term (current) use of insulin: Secondary | ICD-10-CM | POA: Insufficient documentation

## 2018-01-26 DIAGNOSIS — E1165 Type 2 diabetes mellitus with hyperglycemia: Secondary | ICD-10-CM | POA: Insufficient documentation

## 2018-01-26 DIAGNOSIS — R739 Hyperglycemia, unspecified: Secondary | ICD-10-CM

## 2018-01-26 LAB — URINALYSIS, COMPLETE (UACMP) WITH MICROSCOPIC
BILIRUBIN URINE: NEGATIVE
Bacteria, UA: NONE SEEN
Glucose, UA: >=500 mg/dL — AB
Ketones, ur: NEGATIVE mg/dL
Leukocytes, UA: NEGATIVE
NITRITE: NEGATIVE
PROTEIN: NEGATIVE mg/dL
SPECIFIC GRAVITY, URINE: 1.018 (ref 1.005–1.030)
pH: 7 (ref 5.0–8.0)

## 2018-01-26 LAB — BASIC METABOLIC PANEL
ANION GAP: 9 (ref 5–15)
BUN: 11 mg/dL (ref 6–20)
CO2: 25 mmol/L (ref 22–32)
Calcium: 9 mg/dL (ref 8.9–10.3)
Chloride: 100 mmol/L — ABNORMAL LOW (ref 101–111)
Creatinine, Ser: 1.25 mg/dL — ABNORMAL HIGH (ref 0.61–1.24)
Glucose, Bld: 670 mg/dL (ref 65–99)
Potassium: 4.8 mmol/L (ref 3.5–5.1)
Sodium: 134 mmol/L — ABNORMAL LOW (ref 135–145)

## 2018-01-26 LAB — GLUCOSE, CAPILLARY
GLUCOSE-CAPILLARY: 275 mg/dL — AB (ref 65–99)
GLUCOSE-CAPILLARY: 358 mg/dL — AB (ref 65–99)
GLUCOSE-CAPILLARY: 184 mg/dL — AB (ref 65–99)
Glucose-Capillary: >600 mg/dL (ref 65–99)
Glucose-Capillary: >600 mg/dL (ref 65–99)
Glucose-Capillary: 423 mg/dL — ABNORMAL HIGH (ref 65–99)

## 2018-01-26 LAB — CBC
HCT: 41.8 % (ref 40.0–52.0)
HEMOGLOBIN: 13.7 g/dL (ref 13.0–18.0)
MCH: 30.8 pg (ref 26.0–34.0)
MCHC: 32.9 g/dL (ref 32.0–36.0)
MCV: 93.7 fL (ref 80.0–100.0)
Platelets: 266 10*3/uL (ref 150–440)
RBC: 4.46 MIL/uL (ref 4.40–5.90)
RDW: 14.6 % — ABNORMAL HIGH (ref 11.5–14.5)
WBC: 7.6 10*3/uL (ref 3.8–10.6)

## 2018-01-26 MED ORDER — SODIUM CHLORIDE 0.9 % IV BOLUS
1000.0000 mL | Freq: Once | INTRAVENOUS | Status: AC
  Administered 2018-01-26: 1000 mL via INTRAVENOUS

## 2018-01-26 MED ORDER — SODIUM CHLORIDE 0.9 % IV BOLUS
1000.0000 mL | Freq: Once | INTRAVENOUS | Status: AC
Start: 2018-01-26 — End: 2018-01-26
  Administered 2018-01-26: 1000 mL via INTRAVENOUS

## 2018-01-26 MED ORDER — SODIUM CHLORIDE 0.9 % IV SOLN
1000.0000 mL | Freq: Once | INTRAVENOUS | Status: AC
Start: 2018-01-26 — End: 2018-01-26
  Administered 2018-01-26: 1000 mL via INTRAVENOUS

## 2018-01-26 MED ORDER — INSULIN ASPART 100 UNIT/ML ~~LOC~~ SOLN: 10.0000 [IU] | Freq: Once | SUBCUTANEOUS | Status: DC

## 2018-01-26 MED ORDER — INSULIN ASPART 100 UNIT/ML ~~LOC~~ SOLN
10.0000 [IU] | Freq: Once | SUBCUTANEOUS | Status: AC
  Administered 2018-01-26: 10 [IU] via SUBCUTANEOUS
  Filled 2018-01-26: qty 1

## 2018-01-26 MED ORDER — INSULIN ASPART 100 UNIT/ML ~~LOC~~ SOLN
6.0000 [IU] | Freq: Once | SUBCUTANEOUS | Status: AC
  Administered 2018-01-26: 6 [IU] via SUBCUTANEOUS

## 2018-01-26 MED ORDER — INSULIN ASPART 100 UNIT/ML ~~LOC~~ SOLN
SUBCUTANEOUS | Status: AC
  Filled 2018-01-26: qty 1

## 2018-01-26 MED ORDER — INSULIN ASPART 100 UNIT/ML ~~LOC~~ SOLN
SUBCUTANEOUS | Status: AC
  Administered 2018-01-26: 6 [IU] via SUBCUTANEOUS
  Filled 2018-01-26: qty 1

## 2018-01-26 MED ORDER — INSULIN ASPART 100 UNIT/ML ~~LOC~~ SOLN
10.0000 [IU] | Freq: Once | SUBCUTANEOUS | Status: AC
  Administered 2018-01-26: 10 [IU] via SUBCUTANEOUS

## 2018-01-26 NOTE — ED Provider Notes (Signed)
Patient seen by tele-psychiatry and IVC rescinded and cleared for discharge   Jene EveryKinner, Lydia Meng, MD 01/26/18 1456

## 2018-01-26 NOTE — ED Notes (Signed)
Told pt that he was being d/c. Pt states that he cannot afford insulin and free clinic is closed on Sunday. Pt also states "I am feeling suicidal since [place] 2 weeks ago". Also reports feeling suicidal x2 years since "mom passed away". MD and charge RN notified. Will initiate suicide precautions

## 2018-01-26 NOTE — ED Triage Notes (Signed)
Patient states that he ran out of his short acting insulin today. Patient states that he normally takes it three times a day and has only had the morning dose. Patient states that he started vomiting, frequent urination and pain with urination this afternoon.

## 2018-01-26 NOTE — ED Notes (Signed)
Date and time results received: 01/26/18 03:20 (use smartphrase ".now" to insert current time)  Test: glucose Critical Value: 670  Name of Provider Notified: Dr. Dolores FrameSung  Orders Received? Or Actions Taken?: New order received for NS bolus

## 2018-01-26 NOTE — ED Provider Notes (Signed)
FS BG about 450 after eating lunch. Per care everywhere, pt is on sliding scale insulin which indicated 10 units aspart for FS > 400.  Will give insulin and discharge. Pt can pick up his free refill of insulin tomorrow. He's not acidotic   Mark [name], Naser Schuld, MD 01/26/18 1527

## 2018-01-26 NOTE — ED Notes (Signed)
[facility]. Pt dressed in paper scrubs and belongings removed per policy.

## 2018-01-26 NOTE — ED Notes (Signed)
When this RN notified patient he was being discharged patient began shouting and cussing and stated, "this hospital is a piece of shit.  I"m going to go home and kill myself."  MD aware of statement.  SOC was aware of similar statements and felt patient was safe to be discharged.  Patient stomped on his sandwich he had been given prior to leaving the ED.  Patient refused to sign for discharge and refused to take his paperwork.  Patient given his home belongings.  Patient verbalized understanding to follow up with PCP and to get his insulin tomorrow.

## 2018-01-26 NOTE — ED Notes (Signed)
Report received from Noel RN 

## 2018-01-26 NOTE — ED Provider Notes (Signed)
[facility] Emergency Department Provider Note   ____________________________________________    I have reviewed the triage vital signs and the nursing notes.   HISTORY  Chief Complaint Hyperglycemia     HPI Mark [name] is a 32 y.o. male who complains of hyperglycemia.  Patient reports he ran out of his insulin yesterday, he is followed at the medication clinic and is able to get more but has not done so yet.  Complains of mild nausea, feeling weak with some blurry vision.  He reports these are symptoms that he usually gets when his glucose gets out of control.  Denies fevers or chills.  No dysuria, positive polyuria, no cough or shortness of breath.   Past Medical History:  Diagnosis Date  . Depression   . Diabetes mellitus without complication ([place])   . Hydrocephalus   . Kidney stones     Patient Active Problem List   Diagnosis Date Noted  . Suicidal ideation   . Major depressive disorder, recurrent severe without psychotic features ([place])   . Adjustment disorder with depressed mood 12/07/2017  . Noncompliance 10/17/2017  . Dysthymia 10/08/2017  . Personality disorder ([place]) 09/26/2017  . 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 Obstructive hydrocephalus 09/19/2017  . Diabetes ([place]) 09/11/2017  . Hypoglycemia 09/09/2017  . MDD (major depressive disorder) 08/22/2017  . Severe recurrent major depression with psychotic features ([place]) 08/19/2017  . Nausea & vomiting 08/18/2017  . Abdominal pain 08/18/2017  . Hyponatremia 08/18/2017  . DKA (diabetic ketoacidoses) ([place]) 11/24/2015  . Diabetic keto-acidosis ([place]) 10/23/2015    Past Surgical History:  Procedure Laterality Date  . KIDNEY STONE SURGERY      Prior to Admission medications   Medication Sig Start Date End Date Taking? Authorizing Provider  blood glucose meter kit and supplies KIT Dispense based on patient and insurance preference. Use up to four times daily as directed. (FOR ICD-9  250.00, 250.01). Patient not taking: Reported on 12/06/2017 10/08/17   Harvest Dark, MD  diphenhydramine-acetaminophen (TYLENOL PM) 25-500 MG TABS tablet Take 2 tablets by mouth at bedtime as needed (for sleep).    [provider]  gabapentin (NEURONTIN) 400 MG capsule Take 3 capsules (1,200 mg total) by mouth 3 (three) times daily. 12/31/17   Hillary Bow, MD  haloperidol decanoate (HALDOL DECANOATE) 100 MG/ML injection Inject 1 mL (100 mg total) into the muscle every 28 (twenty-eight) days. Patient not taking: Reported on 12/06/2017 09/25/17   Pucilowska, Herma Ard B, MD  insulin aspart (NOVOLOG) 100 UNIT/ML FlexPen Inject 20 Units into the skin 3 (three) times daily before meals. 12/31/17   Sudini, Alveta Heimlich, MD  insulin detemir (LEVEMIR) 100 unit/ml SOLN Inject 0.38 mLs (38 Units total) into the skin at bedtime. 12/31/17   Hillary Bow, MD  Insulin Pen Needle (NOVOFINE) 30G X 8 MM MISC Inject 10 each into the skin as needed. 12/31/17   Hillary Bow, MD  traZODone (DESYREL) 100 MG tablet Take 1 tablet (100 mg total) by mouth at bedtime as needed for sleep. 09/17/17   Pucilowska, Herma Ard B, MD  venlafaxine XR (EFFEXOR-XR) 150 MG 24 hr capsule Take 2 capsules (300 mg total) by mouth daily with breakfast. 09/26/17   Pucilowska, Wardell Honour, MD     Allergies Mushroom extract complex; Penicillins; Sulfa antibiotics; and Clindamycin/lincomycin  Family History  Problem Relation Age of Onset  . Breast cancer Mother     Social History Social History   Tobacco Use  . Smoking status: Current Every Day  Smoker    Packs/day: 1.00    Types: Cigarettes  . Smokeless tobacco: Never Used  Substance Use Topics  . Alcohol use: Not on file    Comment: occassional   . Drug use: Yes    Types: Marijuana    Comment: ''I haven't used in over a month"    Review of Systems  Constitutional: No fever/chills Eyes: No visual changes.  ENT: No sore throat. Cardiovascular: Denies chest  pain. Respiratory: Denies shortness of breath. Gastrointestinal: As above Genitourinary: Negative for dysuria. Musculoskeletal: Mild leg cramping bilaterally Skin: Negative for rash. Neurological: Negative for headaches   ____________________________________________   PHYSICAL EXAM:  VITAL SIGNS: ED Triage Vitals  Enc Vitals Group     BP 01/26/18 0234 (!) 142/87     Pulse Rate 01/26/18 0234 86     Resp 01/26/18 0234 18     Temp 01/26/18 0234 99 F (37.2 C)     Temp Source 01/26/18 0234 Oral     SpO2 01/26/18 0234 97 %     Weight 01/26/18 0235 102.1 kg (225 lb)     Height 01/26/18 0235 1.88 m (6' 2" )     Head Circumference --      Peak Flow --      Pain Score 01/26/18 0235 0     Pain Loc --      Pain Edu? --      Excl. in [place]? --     Constitutional: Alert and oriented. No acute distress. Pleasant and interactive Eyes: Conjunctivae are normal.   Nose: No congestion/rhinnorhea. Mouth/Throat: Mucous membranes are dry Neck:  Painless ROM Cardiovascular: Normal rate, regular rhythm. Grossly normal heart sounds.  Good peripheral circulation. Respiratory: Normal respiratory effort.  No retractions. Lungs CTAB. Gastrointestinal: Soft and nontender. No distention.  No CVA tenderness. Genitourinary: deferred Musculoskeletal:  Warm and well perfused Neurologic:  Normal speech and language. No gross focal neurologic deficits are appreciated.  Skin:  Skin is warm, dry and intact. No rash noted. Psychiatric: Mood and affect are normal. Speech and behavior are normal.  ____________________________________________   LABS (all labs ordered are listed, but only abnormal results are displayed)  Labs Reviewed  BASIC METABOLIC PANEL - Abnormal; Notable for the following components:      Result Value   Sodium 134 (*)    Chloride 100 (*)    Glucose, Bld 670 (*)    Creatinine, Ser 1.25 (*)    All other components within normal limits  CBC - Abnormal; Notable for the following  components:   RDW 14.6 (*)    All other components within normal limits  URINALYSIS, COMPLETE (UACMP) WITH MICROSCOPIC - Abnormal; Notable for the following components:   Color, Urine COLORLESS (*)    APPearance CLEAR (*)    Glucose, UA >=500 (*)    Hgb urine dipstick SMALL (*)    Squamous Epithelial / LPF 0-5 (*)    All other components within normal limits  GLUCOSE, CAPILLARY - Abnormal; Notable for the following components:   Glucose-Capillary >600 (*)    All other components within normal limits  GLUCOSE, CAPILLARY - Abnormal; Notable for the following components:   Glucose-Capillary >600 (*)    All other components within normal limits  GLUCOSE, CAPILLARY - Abnormal; Notable for the following components:   Glucose-Capillary 275 (*)    All other components within normal limits  CBG MONITORING, ED  CBG MONITORING, ED   ____________________________________________  EKG  None ____________________________________________  RADIOLOGY  ____________________________________________   PROCEDURES  Procedure(s) performed: No  Procedures   Critical Care performed: No ____________________________________________   INITIAL IMPRESSION / ASSESSMENT AND PLAN / ED COURSE  Pertinent labs & imaging results that were available during my care of the patient were reviewed by me and considered in my medical decision making (see chart for details).  Patient well-appearing in no acute distress, presents with hyperglycemia.  Glucose is 671 BMP, anion gap is 9.  Overall well-appearing in no acute distress.  Will treat with IV fluids, insulin subcutaneously and reevaluate  ----------------------------------------- 10:10 AM on 01/26/2018 -----------------------------------------  After treatment glucose down to 275, discharge arranged however now the patient is telling us that he wants to die and that if he leaves the hospital he "does not know what might happen "  Involuntary  commitment papers filed, tele-psych consulted    ____________________________________________   FINAL CLINICAL IMPRESSION(S) / ED DIAGNOSES  Final diagnoses:  Hyperglycemia  Suicidal ideation        Note:  This document was prepared using Dragon voice recognition software and may include unintentional dictation errors.    Lavonia Drafts, MD 01/26/18 1148

## 2018-01-27 ENCOUNTER — Telehealth: Payer: Self-pay | Admitting: Pharmacist

## 2018-01-27 ENCOUNTER — Other Ambulatory Visit: Payer: Self-pay

## 2018-01-27 ENCOUNTER — Emergency Department
Admission: EM | Admit: 2018-01-27 | Discharge: 2018-01-27 | Disposition: A | Discharge status: Home / Self Care | Payer: Medicaid Other | Attending: Emergency Medicine | Admitting: Emergency Medicine

## 2018-01-27 ENCOUNTER — Encounter: Payer: Self-pay | Admitting: Emergency Medicine

## 2018-01-27 DIAGNOSIS — Z760 Encounter for issue of repeat prescription: Secondary | ICD-10-CM | POA: Insufficient documentation

## 2018-01-27 DIAGNOSIS — F17210 Nicotine dependence, cigarettes, uncomplicated: Secondary | ICD-10-CM | POA: Insufficient documentation

## 2018-01-27 DIAGNOSIS — Z79899 Other long term (current) drug therapy: Secondary | ICD-10-CM | POA: Insufficient documentation

## 2018-01-27 DIAGNOSIS — E119 Type 2 diabetes mellitus without complications: Secondary | ICD-10-CM | POA: Insufficient documentation

## 2018-01-27 DIAGNOSIS — E860 Dehydration: Secondary | ICD-10-CM

## 2018-01-27 DIAGNOSIS — Z794 Long term (current) use of insulin: Secondary | ICD-10-CM | POA: Insufficient documentation

## 2018-01-27 DIAGNOSIS — F329 Major depressive disorder, single episode, unspecified: Secondary | ICD-10-CM | POA: Insufficient documentation

## 2018-01-27 LAB — CBC
HEMATOCRIT: 43.3 % (ref 40.0–52.0)
HEMOGLOBIN: 14.4 g/dL (ref 13.0–18.0)
MCH: 30.3 pg (ref 26.0–34.0)
MCHC: 33.2 g/dL (ref 32.0–36.0)
MCV: 91.3 fL (ref 80.0–100.0)
Platelets: 292 10*3/uL (ref 150–440)
RBC: 4.74 MIL/uL (ref 4.40–5.90)
RDW: 14.1 % (ref 11.5–14.5)
WBC: 12.5 10*3/uL — AB (ref 3.8–10.6)

## 2018-01-27 LAB — BASIC METABOLIC PANEL
Anion gap: 6 (ref 5–15)
BUN: 14 mg/dL (ref 6–20)
CHLORIDE: 108 mmol/L (ref 101–111)
CO2: 24 mmol/L (ref 22–32)
Calcium: 9.6 mg/dL (ref 8.9–10.3)
Creatinine, Ser: 1.03 mg/dL (ref 0.61–1.24)
GFR calc Af Amer: >60 mL/min (ref >60)
GFR calc non Af Amer: >60 mL/min (ref >60)
Glucose, Bld: 122 mg/dL — ABNORMAL HIGH (ref 65–99)
POTASSIUM: 3.7 mmol/L (ref 3.5–5.1)
SODIUM: 138 mmol/L (ref 135–145)

## 2018-01-27 LAB — BLOOD GAS, VENOUS
Acid-base deficit: 4.7 mmol/L — ABNORMAL HIGH (ref 0.0–2.0)
Bicarbonate: 19.4 mmol/L — ABNORMAL LOW (ref 20.0–28.0)
O2 Saturation: 72.9 %
PATIENT TEMPERATURE: 37
pCO2, Ven: 32 mmHg — ABNORMAL LOW (ref 44.0–60.0)
pH, Ven: 7.39 (ref 7.250–7.430)
pO2, Ven: 39 mmHg (ref 32.0–45.0)

## 2018-01-27 LAB — BETA-HYDROXYBUTYRIC ACID: Beta-Hydroxybutyric Acid: 0.2 mmol/L (ref 0.05–0.27)

## 2018-01-27 LAB — TROPONIN I: Troponin I: <0.03 ng/mL (ref <0.03)

## 2018-01-27 MED ORDER — BLOOD GLUCOSE MONITOR KIT: PACK | 0 refills | Status: DC

## 2018-01-27 MED ORDER — SODIUM CHLORIDE 0.9 % IV BOLUS
1000.0000 mL | Freq: Once | INTRAVENOUS | Status: AC
  Administered 2018-01-27: 1000 mL via INTRAVENOUS

## 2018-01-27 MED ORDER — INSULIN ASPART 100 UNIT/ML FLEXPEN: 20.0000 [IU] | PEN_INJECTOR | Freq: Three times a day (TID) | SUBCUTANEOUS | 0 refills | Status: DC

## 2018-01-27 MED ORDER — INSULIN DETEMIR 100 UNIT/ML FLEXPEN: 38.0000 [IU] | Freq: Every day | SUBCUTANEOUS | 0 refills | Status: DC

## 2018-01-27 NOTE — Telephone Encounter (Signed)
Pt came in today about 25 minutes ago and wanted to get meds filled. Front desk explained that he did not have refills and that we must have prescriptions. She also explained about the information needed for eligibility [facility]. Pt got very upset and became rude and then stormed out yelling obscenities. Security was called when the pt went out to a parked car and then re-entered the builiding/office. Pt was calmer the second time and we explained that we could help set him up to [facility] and patient was given a packet for eligibility. Security arrived about 8 minutes after pt had left the building.  Denice Paradisehristan Holt, PharmD, RPh [facility] [phone]

## 2018-01-27 NOTE — ED Triage Notes (Signed)
Here for insulin prescription. Forgot to get yesterday when here

## 2018-01-27 NOTE — Discharge Instructions (Addendum)
Please make an appointment to follow-up with primary care within 2 days for recheck and return to the emergency department for any concerns.  It was a pleasure to take care of you today, and thank you for coming to our emergency department.  If you have any questions or concerns before leaving please ask the nurse to grab me and I'm more than happy to go through your aftercare instructions again.  If you were prescribed any opioid pain medication today such as Norco, Vicodin, Percocet, morphine, hydrocodone, or oxycodone please make sure you do not drive when you are taking this medication as it can alter your ability to drive safely.  If you have any concerns once you are home that you are not improving or are in fact getting worse before you can make it to your follow-up appointment, please do not hesitate to call 911 and come back for further evaluation.  Mark BrittleNeil Mariajose Mow, MD  Results for orders placed or performed during the hospital encounter of 01/27/18  Basic metabolic panel  Result Value Ref Range   Sodium 138 135 - 145 mmol/L   Potassium 3.7 3.5 - 5.1 mmol/L   Chloride 108 101 - 111 mmol/L   CO2 24 22 - 32 mmol/L   Glucose, Bld 122 (H) 65 - 99 mg/dL   BUN 14 6 - 20 mg/dL   Creatinine, Ser 1.611.03 0.61 - 1.24 mg/dL   Calcium 9.6 8.9 - 09.610.3 mg/dL   GFR calc non Af Amer >60 >60 mL/min   GFR calc Af Amer >60 >60 mL/min   Anion gap 6 5 - 15  CBC  Result Value Ref Range   WBC 12.5 (H) 3.8 - 10.6 K/uL   RBC 4.74 4.40 - 5.90 MIL/uL   Hemoglobin 14.4 13.0 - 18.0 g/dL   HCT 04.543.3 40.940.0 - 81.152.0 %   MCV 91.3 80.0 - 100.0 fL   MCH 30.3 26.0 - 34.0 pg   MCHC 33.2 32.0 - 36.0 g/dL   RDW 91.414.1 78.211.5 - 95.614.5 %   Platelets 292 150 - 440 K/uL  Troponin I  Result Value Ref Range   Troponin I <0.03 <0.03 ng/mL  Blood gas, venous  Result Value Ref Range   pH, Ven 7.39 7.250 - 7.430   pCO2, Ven 32 (L) 44.0 - 60.0 mmHg   pO2, Ven 39.0 32.0 - 45.0 mmHg   Bicarbonate 19.4 (L) 20.0 - 28.0 mmol/L   Acid-base deficit 4.7 (H) 0.0 - 2.0 mmol/L   O2 Saturation 72.9 %   Patient temperature 37.0    Collection site VEIN    Sample type VENOUS   Beta-hydroxybutyric acid  Result Value Ref Range   Beta-Hydroxybutyric Acid 0.20 0.05 - 0.27 mmol/L   No results found.

## 2018-01-27 NOTE — ED Provider Notes (Signed)
[facility] Emergency Department Provider Note  ____________________________________________   First MD Initiated Contact with Patient 01/27/18 1633     (approximate)  I have reviewed the triage vital signs and the nursing notes.   HISTORY  Chief Complaint Medication Refill   HPI Mark [name] is a 32 y.o. male who self presents to the emergency department requesting medication refill.  He was seen in our emergency department last night for hyperglycemia and was discharged to the street.  He said that when he went to the pharmacy to pick up his prescription he did not have any refills left.  He comes back to the emergency department with no particular complaints.  He says he does feel slightly lightheaded and dehydrated.  He reports polyuria and polydipsia.  He does admit to smoking marijuana last night.  He last used the last bit of his insulin early this morning around 3 AM.  His symptoms have been gradual onset slowly progressive and are now mild to moderate severity.  They are improved with insulin and worsened when he does not have insulin.  He denies cocaine or methamphetamine ingestion.  Past Medical History:  Diagnosis Date  . Depression   . Diabetes mellitus without complication ([place])   . Hydrocephalus   . Kidney stones     Patient Active Problem List   Diagnosis Date Noted  . Suicidal ideation   . Major depressive disorder, recurrent severe without psychotic features ([place])   . Adjustment disorder with depressed mood 12/07/2017  . Noncompliance 10/17/2017  . Dysthymia 10/08/2017  . Personality disorder ([place]) 09/26/2017  . 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 Obstructive hydrocephalus 09/19/2017  . Diabetes ([place]) 09/11/2017  . Hypoglycemia 09/09/2017  . MDD (major depressive disorder) 08/22/2017  . Severe recurrent major depression with psychotic features ([place]) 08/19/2017  . Nausea & vomiting 08/18/2017  . Abdominal pain 08/18/2017  .  Hyponatremia 08/18/2017  . DKA (diabetic ketoacidoses) ([place]) 11/24/2015  . Diabetic keto-acidosis ([place]) 10/23/2015    Past Surgical History:  Procedure Laterality Date  . KIDNEY STONE SURGERY      Prior to Admission medications   Medication Sig Start Date End Date Taking? Authorizing Provider  blood glucose meter kit and supplies KIT Dispense based on patient and insurance preference. Use up to four times daily as directed. (FOR ICD-9 250.00, 250.01). 01/27/18   Darel Hong, MD  diphenhydramine-acetaminophen (TYLENOL PM) 25-500 MG TABS tablet Take 2 tablets by mouth at bedtime as needed (for sleep).    [provider]  gabapentin (NEURONTIN) 400 MG capsule Take 3 capsules (1,200 mg total) by mouth 3 (three) times daily. 12/31/17   Hillary Bow, MD  haloperidol decanoate (HALDOL DECANOATE) 100 MG/ML injection Inject 1 mL (100 mg total) into the muscle every 28 (twenty-eight) days. Patient not taking: Reported on 12/06/2017 09/25/17   Pucilowska, Herma Ard B, MD  insulin aspart (NOVOLOG) 100 UNIT/ML FlexPen Inject 20 Units into the skin 3 (three) times daily before meals. 01/27/18   Darel Hong, MD  insulin detemir (LEVEMIR) 100 unit/ml SOLN Inject 0.38 mLs (38 Units total) into the skin at bedtime. 01/27/18   Darel Hong, MD  Insulin Pen Needle (NOVOFINE) 30G X 8 MM MISC Inject 10 each into the skin as needed. 12/31/17   Hillary Bow, MD  traZODone (DESYREL) 100 MG tablet Take 1 tablet (100 mg total) by mouth at bedtime as needed for sleep. 09/17/17   Pucilowska, Jolanta B, MD  venlafaxine XR (EFFEXOR-XR) 150 MG 24  hr capsule Take 2 capsules (300 mg total) by mouth daily with breakfast. 09/26/17   Pucilowska, Wardell Honour, MD    Allergies Mushroom extract complex; Penicillins; Sulfa antibiotics; and Clindamycin/lincomycin  Family History  Problem Relation Age of Onset  . Breast cancer Mother     Social History Social History   Tobacco Use  . Smoking status: Current Every  Day Smoker    Packs/day: 1.00    Types: Cigarettes  . Smokeless tobacco: Never Used  Substance Use Topics  . Alcohol use: Not on file    Comment: occassional   . Drug use: Yes    Types: Marijuana    Comment: ''I haven't used in over a month"    Review of Systems Constitutional: No fever/chills Eyes: No visual changes. ENT: No sore throat. Cardiovascular: Denies chest pain. Respiratory: Denies shortness of breath. Gastrointestinal: No abdominal pain.  No nausea, no vomiting.  No diarrhea.  No constipation. Genitourinary: Negative for dysuria. Musculoskeletal: Negative for back pain. Skin: Negative for rash. Neurological: Negative for headaches, focal weakness or numbness.   ____________________________________________   PHYSICAL EXAM:  VITAL SIGNS: ED Triage Vitals  Enc Vitals Group     BP 01/27/18 1640 (!) 94/58     Pulse Rate 01/27/18 1640 (!) 145     Resp 01/27/18 1639 18     Temp 01/27/18 1639 98.7 F (37.1 C)     Temp Source 01/27/18 1639 Oral     SpO2 01/27/18 1640 96 %     Weight 01/27/18 1639 225 lb (102.1 kg)     Height 01/27/18 1639 _0  (1.88 m)     Head Circumference --      Peak Flow --      Pain Score 01/27/18 1639 0     Pain Loc --      Pain Edu? --      Excl. in [place]? --     Constitutional: Alert and oriented x4 malodorous although no acute distress Eyes: PERRL EOMI. Head: Atraumatic. Nose: No congestion/rhinnorhea. Mouth/Throat: No trismus Neck: No stridor.   Cardiovascular: Tachycardic rate, regular rhythm. Grossly normal heart sounds.  Good peripheral circulation. Respiratory: Normal respiratory effort.  No retractions. Lungs CTAB and moving good air Gastrointestinal: Soft nontender Musculoskeletal: No lower extremity edema   Neurologic:  Normal speech and language. No gross focal neurologic deficits are appreciated. Skin:  Skin is warm, dry and intact. No rash noted. Psychiatric: Mood and affect are normal. Speech and behavior are  normal.    ____________________________________________   DIFFERENTIAL includes but not limited to  Diabetic ketoacidosis, HHS, dehydration, medication noncompliance ____________________________________________   LABS (all labs ordered are listed, but only abnormal results are displayed)  Labs Reviewed  BASIC METABOLIC PANEL - Abnormal; Notable for the following components:      Result Value   Glucose, Bld 122 (*)    All other components within normal limits  CBC - Abnormal; Notable for the following components:   WBC 12.5 (*)    All other components within normal limits  BLOOD GAS, VENOUS - Abnormal; Notable for the following components:   pCO2, Ven 32 (*)    Bicarbonate 19.4 (*)    Acid-base deficit 4.7 (*)    All other components within normal limits  TROPONIN I  BETA-HYDROXYBUTYRIC ACID    Lab work reviewed by me with slightly elevated white count which is nonspecific No evidence of DKA __________________________________________  EKG  ED ECG REPORT I, Darel Hong, the attending physician,  personally viewed and interpreted this ECG.  Date: 01/27/2018 EKG Time:  Rate: 115 Rhythm: normal sinus rhythm QRS Axis: normal Intervals: normal ST/T Wave abnormalities: normal Narrative Interpretation: no evidence of acute ischemia  ____________________________________________  RADIOLOGY   ____________________________________________   PROCEDURES  Procedure(s) performed: no  Procedures  Critical Care performed: no  Observation: no ____________________________________________   INITIAL IMPRESSION / ASSESSMENT AND PLAN / ED COURSE  Pertinent labs & imaging results that were available during my care of the patient were reviewed by me and considered in my medical decision making (see chart for details).  The patient's triage heart rate was 145 however on EKG it was 115 sinus tachycardia.  He is clinically well-appearing with no obvious evidence of  diabetic ketoacidosis.  I do suspect some component of dehydration.  Broad labs and a liter of fluid are pending.     After fluid hydration the patient's heart rate is come down.  His labs are reassuring and he has no evidence of diabetic ketoacidosis.  I will refill his medications and discharge him to the community.  He verbalizes understanding and agreement with the plan. ____________________________________________   FINAL CLINICAL IMPRESSION(S) / ED DIAGNOSES  Final diagnoses:  Medication refill  Dehydration      NEW MEDICATIONS STARTED DURING THIS VISIT:  Discharge Medication List as of 01/27/2018  7:07 PM       Note:  This document was prepared using Dragon voice recognition software and may include unintentional dictation errors.     Darel Hong, MD 01/30/18 [phone]

## 2018-01-27 NOTE — ED Provider Notes (Deleted)
[facility] Emergency Department Provider Note  ____________________________________________   First MD Initiated Contact with Patient 01/27/18 1633     (approximate)  I have reviewed the triage vital signs and the nursing notes.   HISTORY  Chief Complaint Medication Refill   HPI Mark [name] is a 32 y.o. male who comes to the emergency department requesting a refill of his long-acting insulin as well as a short acting insulin.  He was seen in our emergency department last night and was discharged, however he did not receive refills of his insulins yesterday and when he went to refill his medications today he did not have any refills left.  He has no acute complaints at this time.  Past Medical History:  Diagnosis Date  . Depression   . Diabetes mellitus without complication ([place])   . Hydrocephalus   . Kidney stones     Patient Active Problem List   Diagnosis Date Noted  . Suicidal ideation   . Major depressive disorder, recurrent severe without psychotic features ([place])   . Adjustment disorder with depressed mood 12/07/2017  . Noncompliance 10/17/2017  . Dysthymia 10/08/2017  . Personality disorder ([place]) 09/26/2017  . 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 Obstructive hydrocephalus 09/19/2017  . Diabetes ([place]) 09/11/2017  . Hypoglycemia 09/09/2017  . MDD (major depressive disorder) 08/22/2017  . Severe recurrent major depression with psychotic features ([place]) 08/19/2017  . Nausea & vomiting 08/18/2017  . Abdominal pain 08/18/2017  . Hyponatremia 08/18/2017  . DKA (diabetic ketoacidoses) ([place]) 11/24/2015  . Diabetic keto-acidosis ([place]) 10/23/2015    Past Surgical History:  Procedure Laterality Date  . KIDNEY STONE SURGERY      Prior to Admission medications   Medication Sig Start Date End Date Taking? Authorizing Provider  blood glucose meter kit and supplies KIT Dispense based on patient and insurance preference. Use up to four times daily as  directed. (FOR ICD-9 250.00, 250.01). 01/27/18   Darel Hong, MD  diphenhydramine-acetaminophen (TYLENOL PM) 25-500 MG TABS tablet Take 2 tablets by mouth at bedtime as needed (for sleep).    [provider]  gabapentin (NEURONTIN) 400 MG capsule Take 3 capsules (1,200 mg total) by mouth 3 (three) times daily. 12/31/17   Hillary Bow, MD  haloperidol decanoate (HALDOL DECANOATE) 100 MG/ML injection Inject 1 mL (100 mg total) into the muscle every 28 (twenty-eight) days. Patient not taking: Reported on 12/06/2017 09/25/17   Pucilowska, Herma Ard B, MD  insulin aspart (NOVOLOG) 100 UNIT/ML FlexPen Inject 20 Units into the skin 3 (three) times daily before meals. 01/27/18   Darel Hong, MD  insulin detemir (LEVEMIR) 100 unit/ml SOLN Inject 0.38 mLs (38 Units total) into the skin at bedtime. 01/27/18   Darel Hong, MD  Insulin Pen Needle (NOVOFINE) 30G X 8 MM MISC Inject 10 each into the skin as needed. 12/31/17   Hillary Bow, MD  traZODone (DESYREL) 100 MG tablet Take 1 tablet (100 mg total) by mouth at bedtime as needed for sleep. 09/17/17   Pucilowska, Herma Ard B, MD  venlafaxine XR (EFFEXOR-XR) 150 MG 24 hr capsule Take 2 capsules (300 mg total) by mouth daily with breakfast. 09/26/17   Pucilowska, Wardell Honour, MD    Allergies Mushroom extract complex; Penicillins; Sulfa antibiotics; and Clindamycin/lincomycin  Family History  Problem Relation Age of Onset  . Breast cancer Mother     Social History Social History   Tobacco Use  . Smoking status: Current Every Day Smoker    Packs/day: 1.00  Types: Cigarettes  . Smokeless tobacco: Never Used  Substance Use Topics  . Alcohol use: Not on file    Comment: occassional   . Drug use: Yes    Types: Marijuana    Comment: ''I haven't used in over a month"    Review of Systems Constitutional: No fever/chills Cardiovascular: Denies chest pain. Respiratory: Denies shortness of breath. Gastrointestinal: No abdominal pain.     Neurological: Negative for headaches   ____________________________________________   PHYSICAL EXAM:  VITAL SIGNS: ED Triage Vitals  Enc Vitals Group     BP      Pulse      Resp      Temp      Temp src      SpO2      Weight      Height      Head Circumference      Peak Flow      Pain Score      Pain Loc      Pain Edu?      Excl. in [place]?     Constitutional: Well-appearing no acute distress Head: Atraumatic. Nose: No congestion/rhinnorhea. Mouth/Throat: No trismus Neck: No stridor.   Respiratory: Normal respiratory effort.  No retractions. Neurologic:No gross focal neurologic deficits are appreciated.  Skin:  Skin is warm, dry and intact. No rash noted.    ____________________________________________  LABS (all labs ordered are listed, but only abnormal results are displayed)  Labs Reviewed - No data to display   __________________________________________  EKG   ____________________________________________  RADIOLOGY   ____________________________________________   DIFFERENTIAL includes but not limited to  Medication refill   PROCEDURES  Procedure(s) performed: no  Procedures  Critical Care performed: no  Observation: no ____________________________________________   INITIAL IMPRESSION / ASSESSMENT AND PLAN / ED COURSE  Pertinent labs & imaging results that were available during my care of the patient were reviewed by me and considered in my medical decision making (see chart for details).  The patient has no acute medical complaints.  He was seen and evaluated less than 24 hours ago.  Insulin as well as home testing supplies refilled.  Discharged home in good condition with return precautions.      ____________________________________________   FINAL CLINICAL IMPRESSION(S) / ED DIAGNOSES  Final diagnoses:  Medication refill      NEW MEDICATIONS STARTED DURING THIS VISIT:  Current Discharge Medication List        Note:  This document was prepared using Dragon voice recognition software and may include unintentional dictation errors.      Darel Hong, MD 01/27/18 1640

## 2018-01-27 NOTE — ED Notes (Signed)
Md paged social work. No one is on call at this time.

## 2018-01-27 NOTE — ED Notes (Signed)
ED Provider at bedside. 

## 2018-01-29 ENCOUNTER — Ambulatory Visit: Payer: Medicaid Other | Admitting: Pharmacy Technician

## 2018-01-29 DIAGNOSIS — Z79899 Other long term (current) drug therapy: Secondary | ICD-10-CM

## 2018-01-30 NOTE — Progress Notes (Signed)
Met with patient completed financial assistance application for [facility] due to recent ED visit.  Patient agreed to be responsible for gathering financial information and forwarding to appropriate department in [facility].    Completed [facility] application and contract.  Patient agreed to all terms of the [facility] contract.    Patient to receive medication assistance at [facility] through 2019, as long as eligibility criteria continues to be met.    Provided patient with Civil engineer, contracting based on his particular needs.    Lantus Prescription Application completed with patient.  Forwarded to [facility] for signature.  Upon receipt of signed application from provider, Lantus Prescription Application will be submitted to [employer].  [facility]

## 2018-02-08 ENCOUNTER — Emergency Department
Admission: EM | Admit: 2018-02-08 | Discharge: 2018-02-08 | Disposition: A | Discharge status: Home / Self Care | Payer: Medicaid Other | Attending: Emergency Medicine | Admitting: Emergency Medicine

## 2018-02-08 ENCOUNTER — Other Ambulatory Visit: Payer: Self-pay

## 2018-02-08 ENCOUNTER — Encounter: Payer: Self-pay | Admitting: Emergency Medicine

## 2018-02-08 DIAGNOSIS — Z765 Malingerer [conscious simulation]: Secondary | ICD-10-CM | POA: Insufficient documentation

## 2018-02-08 DIAGNOSIS — Z5900 Homelessness unspecified: Secondary | ICD-10-CM

## 2018-02-08 DIAGNOSIS — F329 Major depressive disorder, single episode, unspecified: Secondary | ICD-10-CM | POA: Insufficient documentation

## 2018-02-08 DIAGNOSIS — E1110 Type 2 diabetes mellitus with ketoacidosis without coma: Secondary | ICD-10-CM | POA: Insufficient documentation

## 2018-02-08 DIAGNOSIS — Z79899 Other long term (current) drug therapy: Secondary | ICD-10-CM | POA: Insufficient documentation

## 2018-02-08 DIAGNOSIS — F17210 Nicotine dependence, cigarettes, uncomplicated: Secondary | ICD-10-CM | POA: Insufficient documentation

## 2018-02-08 LAB — COMPREHENSIVE METABOLIC PANEL
ALBUMIN: 4.3 g/dL (ref 3.5–5.0)
ALK PHOS: 83 U/L (ref 38–126)
ALT: 15 U/L — ABNORMAL LOW (ref 17–63)
AST: 36 U/L (ref 15–41)
Anion gap: 7 (ref 5–15)
BILIRUBIN TOTAL: 0.6 mg/dL (ref 0.3–1.2)
BUN: 7 mg/dL (ref 6–20)
CALCIUM: 9 mg/dL (ref 8.9–10.3)
CO2: 24 mmol/L (ref 22–32)
Chloride: 105 mmol/L (ref 101–111)
Creatinine, Ser: 0.88 mg/dL (ref 0.61–1.24)
GFR calc Af Amer: >60 mL/min (ref >60)
GFR calc non Af Amer: >60 mL/min (ref >60)
GLUCOSE: 299 mg/dL — AB (ref 65–99)
Potassium: 4 mmol/L (ref 3.5–5.1)
Sodium: 136 mmol/L (ref 135–145)
TOTAL PROTEIN: 7.1 g/dL (ref 6.5–8.1)

## 2018-02-08 LAB — URINE DRUG SCREEN, QUALITATIVE (ARMC ONLY)
Amphetamines, Ur Screen: NOT DETECTED
BARBITURATES, UR SCREEN: NOT DETECTED
BENZODIAZEPINE, UR SCRN: NOT DETECTED
Cannabinoid 50 Ng, Ur ~~LOC~~: POSITIVE — AB
Cocaine Metabolite,Ur ~~LOC~~: NOT DETECTED
MDMA (Ecstasy)Ur Screen: NOT DETECTED
Methadone Scn, Ur: NOT DETECTED
OPIATE, UR SCREEN: NOT DETECTED
PHENCYCLIDINE (PCP) UR S: NOT DETECTED
Tricyclic, Ur Screen: NOT DETECTED

## 2018-02-08 LAB — CBC
HEMATOCRIT: 38.4 % — AB (ref 40.0–52.0)
Hemoglobin: 13.1 g/dL (ref 13.0–18.0)
MCH: 31.6 pg (ref 26.0–34.0)
MCHC: 34 g/dL (ref 32.0–36.0)
MCV: 93 fL (ref 80.0–100.0)
Platelets: 289 10*3/uL (ref 150–440)
RBC: 4.13 MIL/uL — ABNORMAL LOW (ref 4.40–5.90)
RDW: 14.8 % — AB (ref 11.5–14.5)
WBC: 8.3 10*3/uL (ref 3.8–10.6)

## 2018-02-08 LAB — GLUCOSE, CAPILLARY: Glucose-Capillary: 295 mg/dL — ABNORMAL HIGH (ref 65–99)

## 2018-02-08 LAB — ACETAMINOPHEN LEVEL: Acetaminophen (Tylenol), Serum: <10 ug/mL — ABNORMAL LOW (ref 10–30)

## 2018-02-08 LAB — ETHANOL: Alcohol, Ethyl (B): <10 mg/dL (ref <10)

## 2018-02-08 LAB — SALICYLATE LEVEL: Salicylate Lvl: <7 mg/dL (ref 2.8–30.0)

## 2018-02-08 NOTE — ED Notes (Signed)
BEHAVIORAL HEALTH ROUNDING  Patient sleeping: No.  Patient alert and oriented: yes  Behavior appropriate: Yes. ; If no, describe:  Nutrition and fluids offered: Yes  Toileting and hygiene offered: Yes  Sitter present: not applicable, Q 15 min safety rounds and observation.  Law enforcement present: Yes ODS  

## 2018-02-08 NOTE — ED Notes (Signed)

## 2018-02-08 NOTE — ED Notes (Signed)
[facility]. Pt taken to consult room and understands the process of talking with the doctor on the camera.

## 2018-02-08 NOTE — ED Notes (Signed)
This RN attempted to go through the discharge process with pt who became upset over being discharged. Pt asking to sleep in the lobby for the night and pt was told he could as long as he was not disruptive and pt is agreeable. Pt refused vital signs or review of discharge papers.

## 2018-02-08 NOTE — ED Notes (Signed)
MD at bedside. 

## 2018-02-08 NOTE — ED Provider Notes (Signed)
[facility] Emergency Department Provider Note   ____________________________________________   I have reviewed the triage vital signs and the nursing notes.   HISTORY  Chief Complaint No where to go  History limited by: Not Limited   HPI Mark [name] is a 32 y.o. male who presents to the emergency department today because he states he was kicked out of his friend's house where he has been staying at the shelter when taken.  He states he has nowhere to go.  He then secondarily stated he also has been feeling suicidal.  States this started this morning.  He has been thinking about not taking his insulin.  He denies any recent illness.   Per medical record review patient has a history of Depression, DM.  Past Medical History:  Diagnosis Date  . Depression   . Diabetes mellitus without complication ([place])   . Hydrocephalus   . Kidney stones     Patient Active Problem List   Diagnosis Date Noted  . Suicidal ideation   . Major depressive disorder, recurrent severe without psychotic features ([place])   . Adjustment disorder with depressed mood 12/07/2017  . Noncompliance 10/17/2017  . Dysthymia 10/08/2017  . Personality disorder ([place]) 09/26/2017  . 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 Obstructive hydrocephalus 09/19/2017  . Diabetes ([place]) 09/11/2017  . Hypoglycemia 09/09/2017  . MDD (major depressive disorder) 08/22/2017  . Severe recurrent major depression with psychotic features ([place]) 08/19/2017  . Nausea & vomiting 08/18/2017  . Abdominal pain 08/18/2017  . Hyponatremia 08/18/2017  . DKA (diabetic ketoacidoses) ([place]) 11/24/2015  . Diabetic keto-acidosis ([place]) 10/23/2015    Past Surgical History:  Procedure Laterality Date  . KIDNEY STONE SURGERY      Prior to Admission medications   Medication Sig Start Date End Date Taking? Authorizing Provider  blood glucose meter kit and supplies KIT Dispense based on patient and insurance preference. Use up to  four times daily as directed. (FOR ICD-9 250.00, 250.01). 01/27/18   Darel Hong, MD  diphenhydramine-acetaminophen (TYLENOL PM) 25-500 MG TABS tablet Take 2 tablets by mouth at bedtime as needed (for sleep).    [provider]  gabapentin (NEURONTIN) 400 MG capsule Take 3 capsules (1,200 mg total) by mouth 3 (three) times daily. 12/31/17   Hillary Bow, MD  haloperidol decanoate (HALDOL DECANOATE) 100 MG/ML injection Inject 1 mL (100 mg total) into the muscle every 28 (twenty-eight) days. Patient not taking: Reported on 12/06/2017 09/25/17   Pucilowska, Herma Ard B, MD  insulin aspart (NOVOLOG) 100 UNIT/ML FlexPen Inject 20 Units into the skin 3 (three) times daily before meals. 01/27/18   Darel Hong, MD  insulin detemir (LEVEMIR) 100 unit/ml SOLN Inject 0.38 mLs (38 Units total) into the skin at bedtime. 01/27/18   Darel Hong, MD  Insulin Pen Needle (NOVOFINE) 30G X 8 MM MISC Inject 10 each into the skin as needed. 12/31/17   Hillary Bow, MD  traZODone (DESYREL) 100 MG tablet Take 1 tablet (100 mg total) by mouth at bedtime as needed for sleep. 09/17/17   Pucilowska, Herma Ard B, MD  venlafaxine XR (EFFEXOR-XR) 150 MG 24 hr capsule Take 2 capsules (300 mg total) by mouth daily with breakfast. 09/26/17   Pucilowska, Wardell Honour, MD    Allergies Mushroom extract complex; Penicillins; Sulfa antibiotics; and Clindamycin/lincomycin  Family History  Problem Relation Age of Onset  . Breast cancer Mother     Social History Social History   Tobacco Use  . Smoking status: Current Every  Day Smoker    Packs/day: 0.75    Types: Cigarettes  . Smokeless tobacco: Never Used  Substance Use Topics  . Alcohol use: Not on file    Comment: occassional   . Drug use: Yes    Types: Marijuana    Comment: ''I haven't used in over a month"    Review of Systems Constitutional: No fever/chills Eyes: No visual changes. ENT: No sore throat. Cardiovascular: Denies chest pain. Respiratory:  Denies shortness of breath. Gastrointestinal: No abdominal pain.  No nausea, no vomiting.  No diarrhea.   Genitourinary: Negative for dysuria. Musculoskeletal: Negative for back pain. Skin: Negative for rash. Neurological: Negative for headaches, focal weakness or numbness.  ____________________________________________   PHYSICAL EXAM:  VITAL SIGNS: ED Triage Vitals  Enc Vitals Group     BP 02/08/18 1753 114/72     Pulse Rate 02/08/18 1753 (!) 105     Resp 02/08/18 1753 20     Temp 02/08/18 1753 98.8 F (37.1 C)     Temp Source 02/08/18 1753 Oral     SpO2 02/08/18 1753 96 %     Weight 02/08/18 1754 225 lb (102.1 kg)     Height 02/08/18 1754 6' 2"  (1.88 m)     Head Circumference --      Peak Flow --      Pain Score 02/08/18 1754 0   Constitutional: Alert and oriented. Eyes: Conjunctivae are normal.  ENT   Head: Normocephalic and atraumatic.   Nose: No congestion/rhinnorhea.   Mouth/Throat: Mucous membranes are moist.   Neck: No stridor. Hematological/Lymphatic/Immunilogical: No cervical lymphadenopathy. Cardiovascular: Normal rate, regular rhythm.  No murmurs, rubs, or gallops.  Respiratory: Normal respiratory effort without tachypnea nor retractions. Breath sounds are clear and equal bilaterally. No wheezes/rales/rhonchi. Gastrointestinal: Soft and non tender. No rebound. No guarding.  Genitourinary: Deferred Musculoskeletal: Normal range of motion in all extremities. No lower extremity edema. Neurologic:  Normal speech and language. No gross focal neurologic deficits are appreciated.  Skin:  Skin is warm, dry and intact. No rash noted. Psychiatric: Mood and affect are normal. Speech and behavior are normal. Patient exhibits appropriate insight and judgment.  ____________________________________________    LABS (pertinent positives/negatives)  CMP wnl except for glu 299, alt 15 CBC wbc 8.3, hgb 74.8, plt 270 Salicylate <7.8, alcohol <10, acetaminophen  <10 UDS cannabinoid positive ____________________________________________   EKG  None  ____________________________________________    RADIOLOGY  None  ____________________________________________   PROCEDURES  Procedures  ____________________________________________   INITIAL IMPRESSION / ASSESSMENT AND PLAN / ED COURSE  Pertinent labs & imaging results that were available during my care of the patient were reviewed by me and considered in my medical decision making (see chart for details).  Patient here with initial complaint of no where else to go. Also complaining of depression. Patient is well known to the emergency department. Was evaluated by telepsychiatrist who diagnosed malingering and homelessness. Does not think he meets IVC criteria and does not require inpatient admission. Will discharge. Discussed with patient he is welcome to wait in lobby for SW.  ____________________________________________   FINAL CLINICAL IMPRESSION(S) / ED DIAGNOSES  Final diagnoses:  Malingering  Homelessness     Note: This dictation was prepared with Dragon dictation. Any transcriptional errors that result from this process are unintentional     Nance Pear, MD 02/08/18 2303

## 2018-02-08 NOTE — ED Notes (Signed)

## 2018-02-08 NOTE — ED Triage Notes (Signed)
States was kicked out of friends house where he was living. States he does not have anywhere else to go "so suicide is my best option." States he has a plan of "slicing my wrists".  Denies having done anything to harm self at this point. States his other option was to overdose on insulin or not take his insulin.

## 2018-02-08 NOTE — Discharge Instructions (Addendum)
Please seek medical attention and help for any thoughts about wanting to harm yourself, harm others, any concerning change in behavior, severe depression, inappropriate drug use or any other new or concerning symptoms. ° °

## 2018-02-11 ENCOUNTER — Telehealth: Payer: Self-pay | Admitting: Pharmacist

## 2018-02-11 NOTE — Telephone Encounter (Signed)
02/11/2018 1:22:32 PM - Lantus Solostar  02/11/18 Faxed [employer] application for Lantus Solostar Inject 35 units under the skin once a day at bedtime, #3.Forde RadonAJ

## 2018-02-16 ENCOUNTER — Encounter: Payer: Self-pay | Admitting: Emergency Medicine

## 2018-02-16 ENCOUNTER — Other Ambulatory Visit: Payer: Self-pay

## 2018-02-16 ENCOUNTER — Emergency Department
Admission: EM | Admit: 2018-02-16 | Discharge: 2018-02-20 | Disposition: A | Discharge status: Other Acute Inpatient Hospital | Payer: Medicaid Other | Attending: Emergency Medicine | Admitting: Emergency Medicine

## 2018-02-16 DIAGNOSIS — R451 Restlessness and agitation: Secondary | ICD-10-CM

## 2018-02-16 DIAGNOSIS — F332 Major depressive disorder, recurrent severe without psychotic features: Secondary | ICD-10-CM | POA: Insufficient documentation

## 2018-02-16 DIAGNOSIS — Z794 Long term (current) use of insulin: Secondary | ICD-10-CM | POA: Insufficient documentation

## 2018-02-16 DIAGNOSIS — R45851 Suicidal ideations: Secondary | ICD-10-CM

## 2018-02-16 DIAGNOSIS — Z79899 Other long term (current) drug therapy: Secondary | ICD-10-CM | POA: Insufficient documentation

## 2018-02-16 DIAGNOSIS — Z9119 Patient's noncompliance with other medical treatment and regimen: Secondary | ICD-10-CM

## 2018-02-16 DIAGNOSIS — Z91199 Patient's noncompliance with other medical treatment and regimen due to unspecified reason: Secondary | ICD-10-CM

## 2018-02-16 DIAGNOSIS — E119 Type 2 diabetes mellitus without complications: Secondary | ICD-10-CM

## 2018-02-16 DIAGNOSIS — F4321 Adjustment disorder with depressed mood: Secondary | ICD-10-CM | POA: Diagnosis present

## 2018-02-16 DIAGNOSIS — F609 Personality disorder, unspecified: Secondary | ICD-10-CM | POA: Diagnosis present

## 2018-02-16 DIAGNOSIS — F17210 Nicotine dependence, cigarettes, uncomplicated: Secondary | ICD-10-CM | POA: Insufficient documentation

## 2018-02-16 LAB — CBC
HCT: 42.3 % (ref 40.0–52.0)
Hemoglobin: 14.6 g/dL (ref 13.0–18.0)
MCH: 32 pg (ref 26.0–34.0)
MCHC: 34.5 g/dL (ref 32.0–36.0)
MCV: 92.9 fL (ref 80.0–100.0)
PLATELETS: 304 10*3/uL (ref 150–440)
RBC: 4.56 MIL/uL (ref 4.40–5.90)
RDW: 14.4 % (ref 11.5–14.5)
WBC: 10.2 10*3/uL (ref 3.8–10.6)

## 2018-02-16 LAB — GLUCOSE, CAPILLARY
GLUCOSE-CAPILLARY: 475 mg/dL — AB (ref 65–99)
GLUCOSE-CAPILLARY: 509 mg/dL — AB (ref 65–99)
GLUCOSE-CAPILLARY: 277 mg/dL — AB (ref 65–99)
Glucose-Capillary: >600 mg/dL (ref 65–99)
Glucose-Capillary: >600 mg/dL (ref 65–99)
Glucose-Capillary: 385 mg/dL — ABNORMAL HIGH (ref 65–99)
Glucose-Capillary: 102 mg/dL — ABNORMAL HIGH (ref 65–99)

## 2018-02-16 LAB — URINE DRUG SCREEN, QUALITATIVE (ARMC ONLY)
AMPHETAMINES, UR SCREEN: NOT DETECTED
Barbiturates, Ur Screen: NOT DETECTED
Benzodiazepine, Ur Scrn: NOT DETECTED
CANNABINOID 50 NG, UR ~~LOC~~: POSITIVE — AB
COCAINE METABOLITE, UR ~~LOC~~: NOT DETECTED
MDMA (Ecstasy)Ur Screen: NOT DETECTED
METHADONE SCREEN, URINE: NOT DETECTED
Opiate, Ur Screen: NOT DETECTED
Phencyclidine (PCP) Ur S: NOT DETECTED
TRICYCLIC, UR SCREEN: NOT DETECTED

## 2018-02-16 LAB — COMPREHENSIVE METABOLIC PANEL
ALK PHOS: 88 U/L (ref 38–126)
ALT: 16 U/L — AB (ref 17–63)
AST: 23 U/L (ref 15–41)
Albumin: 4.6 g/dL (ref 3.5–5.0)
Anion gap: 10 (ref 5–15)
BUN: 11 mg/dL (ref 6–20)
CALCIUM: 9.2 mg/dL (ref 8.9–10.3)
CO2: 24 mmol/L (ref 22–32)
CREATININE: 0.95 mg/dL (ref 0.61–1.24)
Chloride: 101 mmol/L (ref 101–111)
Glucose, Bld: 477 mg/dL — ABNORMAL HIGH (ref 65–99)
Potassium: 4 mmol/L (ref 3.5–5.1)
Sodium: 135 mmol/L (ref 135–145)
TOTAL PROTEIN: 7.8 g/dL (ref 6.5–8.1)
Total Bilirubin: 0.9 mg/dL (ref 0.3–1.2)

## 2018-02-16 LAB — ETHANOL: Alcohol, Ethyl (B): <10 mg/dL (ref <10)

## 2018-02-16 LAB — ACETAMINOPHEN LEVEL: Acetaminophen (Tylenol), Serum: <10 ug/mL — ABNORMAL LOW (ref 10–30)

## 2018-02-16 LAB — SALICYLATE LEVEL

## 2018-02-16 MED ORDER — GABAPENTIN 300 MG PO CAPS
1200.0000 mg | ORAL_CAPSULE | Freq: Three times a day (TID) | ORAL | Status: DC
  Administered 2018-02-16 – 2018-02-20 (×12): 1200 mg via ORAL
  Filled 2018-02-16: qty 3
  Filled 2018-02-16 (×6): qty 4
  Filled 2018-02-16 (×2): qty 3
  Filled 2018-02-16: qty 4
  Filled 2018-02-16 (×3): qty 3

## 2018-02-16 MED ORDER — TRAZODONE HCL 100 MG PO TABS
100.0000 mg | ORAL_TABLET | Freq: Every evening | ORAL | Status: DC | PRN
  Administered 2018-02-16: 100 mg via ORAL
  Filled 2018-02-16: qty 1

## 2018-02-16 MED ORDER — INSULIN ASPART 100 UNIT/ML ~~LOC~~ SOLN
10.0000 [IU] | Freq: Once | SUBCUTANEOUS | Status: AC
Start: 2018-02-16 — End: 2018-02-16
  Administered 2018-02-16: 10 [IU] via INTRAVENOUS
  Filled 2018-02-16: qty 1

## 2018-02-16 MED ORDER — SODIUM CHLORIDE 0.9 % IV BOLUS
1000.0000 mL | Freq: Once | INTRAVENOUS | Status: AC
  Administered 2018-02-16: 1000 mL via INTRAVENOUS

## 2018-02-16 MED ORDER — VENLAFAXINE HCL ER 75 MG PO CP24
300.0000 mg | ORAL_CAPSULE | Freq: Every day | ORAL | Status: DC
  Administered 2018-02-17 – 2018-02-20 (×4): 300 mg via ORAL
  Filled 2018-02-16: qty 4
  Filled 2018-02-16: qty 2
  Filled 2018-02-16 (×3): qty 4

## 2018-02-16 MED ORDER — NICOTINE 14 MG/24HR TD PT24
14.0000 mg | MEDICATED_PATCH | Freq: Once | TRANSDERMAL | Status: AC
  Administered 2018-02-16: 14 mg via TRANSDERMAL
  Filled 2018-02-16: qty 1

## 2018-02-16 MED ORDER — INSULIN DETEMIR 100 UNIT/ML ~~LOC~~ SOLN
38.0000 [IU] | Freq: Every day | SUBCUTANEOUS | Status: DC
  Administered 2018-02-16: 38 [IU] via SUBCUTANEOUS
  Filled 2018-02-16 (×2): qty 0.38

## 2018-02-16 MED ORDER — INSULIN ASPART 100 UNIT/ML ~~LOC~~ SOLN
20.0000 [IU] | Freq: Three times a day (TID) | SUBCUTANEOUS | Status: DC
  Administered 2018-02-16: 20 [IU] via SUBCUTANEOUS
  Filled 2018-02-16: qty 1

## 2018-02-16 MED ORDER — SODIUM CHLORIDE 0.9 % IV SOLN
1000.0000 mL | Freq: Once | INTRAVENOUS | Status: AC
  Administered 2018-02-16: 1000 mL via INTRAVENOUS

## 2018-02-16 MED ORDER — VENLAFAXINE HCL ER 150 MG PO CP24
150.0000 mg | ORAL_CAPSULE | Freq: Every day | ORAL | Status: DC
  Administered 2018-02-16: 150 mg via ORAL
  Filled 2018-02-16: qty 1

## 2018-02-16 MED ORDER — INSULIN ASPART 100 UNIT/ML ~~LOC~~ SOLN
10.0000 [IU] | Freq: Once | SUBCUTANEOUS | Status: AC
  Administered 2018-02-16: 10 [IU] via SUBCUTANEOUS
  Filled 2018-02-16: qty 1

## 2018-02-16 NOTE — ED Provider Notes (Signed)
[facility] Emergency Department Provider Note   ____________________________________________   First MD Initiated Contact with Patient 02/16/18 0142     (approximate)  I have reviewed the triage vital signs and the nursing notes.   HISTORY  Chief Complaint Suicidal    HPI ISAMU TRAMMEL is a 32 y.o. male with a history of bipolar disorder who comes into the hospital today with some suicidal ideation.  He reports that he has been having these thoughts for some time.  He reports that every time he comes into the hospital he gets sent home.  The patient reports that this is the last straw.  He denies any drinking or doing any drugs.  Looking at the patient's notes he was offered a ride to [place] where he could undergo treatment a week ago when he was seen here.  The patient left before he received his bus ticket to [facility] and states that his friend was unable to take him.  He states that he has been taking his medicine but he has been out of his psych meds for the past 3 months.  The patient is here today for evaluation of his symptoms.   Past Medical History:  Diagnosis Date  . Depression   . Diabetes mellitus without complication ([place])   . Hydrocephalus   . Kidney stones     Patient Active Problem List   Diagnosis Date Noted  . Suicidal ideation   . Major depressive disorder, recurrent severe without psychotic features ([place])   . Adjustment disorder with depressed mood 12/07/2017  . Noncompliance 10/17/2017  . Dysthymia 10/08/2017  . Personality disorder ([place]) 09/26/2017  . 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 Obstructive hydrocephalus 09/19/2017  . Diabetes ([place]) 09/11/2017  . Hypoglycemia 09/09/2017  . MDD (major depressive disorder) 08/22/2017  . Severe recurrent major depression with psychotic features ([place]) 08/19/2017  . Nausea & vomiting 08/18/2017  . Abdominal pain 08/18/2017  . Hyponatremia 08/18/2017  . DKA (diabetic ketoacidoses) ([place])  11/24/2015  . Diabetic keto-acidosis ([place]) 10/23/2015    Past Surgical History:  Procedure Laterality Date  . KIDNEY STONE SURGERY      Prior to Admission medications   Medication Sig Start Date End Date Taking? Authorizing Provider  blood glucose meter kit and supplies KIT Dispense based on patient and insurance preference. Use up to four times daily as directed. (FOR ICD-9 250.00, 250.01). 01/27/18   Darel Hong, MD  diphenhydramine-acetaminophen (TYLENOL PM) 25-500 MG TABS tablet Take 2 tablets by mouth at bedtime as needed (for sleep).    [provider]  gabapentin (NEURONTIN) 400 MG capsule Take 3 capsules (1,200 mg total) by mouth 3 (three) times daily. 12/31/17   Hillary Bow, MD  haloperidol decanoate (HALDOL DECANOATE) 100 MG/ML injection Inject 1 mL (100 mg total) into the muscle every 28 (twenty-eight) days. Patient not taking: Reported on 12/06/2017 09/25/17   Pucilowska, Herma Ard B, MD  insulin aspart (NOVOLOG) 100 UNIT/ML FlexPen Inject 20 Units into the skin 3 (three) times daily before meals. 01/27/18   Darel Hong, MD  insulin detemir (LEVEMIR) 100 unit/ml SOLN Inject 0.38 mLs (38 Units total) into the skin at bedtime. 01/27/18   Darel Hong, MD  Insulin Pen Needle (NOVOFINE) 30G X 8 MM MISC Inject 10 each into the skin as needed. 12/31/17   Hillary Bow, MD  traZODone (DESYREL) 100 MG tablet Take 1 tablet (100 mg total) by mouth at bedtime as needed for sleep. 09/17/17   Pucilowska, Jolanta B,  MD  venlafaxine XR (EFFEXOR-XR) 150 MG 24 hr capsule Take 2 capsules (300 mg total) by mouth daily with breakfast. 09/26/17   Pucilowska, Wardell Honour, MD    Allergies Mushroom extract complex; Penicillins; Sulfa antibiotics; and Clindamycin/lincomycin  Family History  Problem Relation Age of Onset  . Breast cancer Mother     Social History Social History   Tobacco Use  . Smoking status: Current Every Day Smoker    Packs/day: 0.75    Types: Cigarettes  .  Smokeless tobacco: Never Used  Substance Use Topics  . Alcohol use: Not on file    Comment: occassional   . Drug use: Yes    Types: Marijuana    Comment: smoked 2-3 days ago    Review of Systems  Constitutional: No fever/chills Eyes: No visual changes. ENT: No sore throat. Cardiovascular: Denies chest pain. Respiratory: Denies shortness of breath. Gastrointestinal: No abdominal pain.  No nausea, no vomiting.  No diarrhea.  No constipation. Genitourinary: Negative for dysuria. Musculoskeletal: Negative for back pain. Skin: Negative for rash. Neurological: Negative for headaches, focal weakness or numbness. Psych: Suicidal ideation  ____________________________________________   PHYSICAL EXAM:  VITAL SIGNS: ED Triage Vitals  Enc Vitals Group     BP 02/16/18 0040 126/72     Pulse Rate 02/16/18 0040 (!) 117     Resp --      Temp 02/16/18 0040 98.1 F (36.7 C)     Temp Source 02/16/18 0040 Oral     SpO2 02/16/18 0040 98 %     Weight 02/16/18 0040 230 lb (104.3 kg)     Height 02/16/18 0040 '6\' 2"'$  (1.88 m)     Head Circumference --      Peak Flow --      Pain Score 02/16/18 0109 0     Pain Loc --      Pain Edu? --      Excl. in [place]? --     Constitutional: Alert and oriented. Mildly agitated and in no acute distress. Eyes: Conjunctivae are normal. PERRL. EOMI. Head: Atraumatic. Nose: No congestion/rhinnorhea. Mouth/Throat: Mucous membranes are moist.  Oropharynx non-erythematous. Cardiovascular: Tachycardia, regular rhythm. Grossly normal heart sounds.  Good peripheral circulation. Respiratory: Normal respiratory effort.  No retractions. Lungs CTAB. Gastrointestinal: Soft and nontender. No distention. Positive bowel sounds Musculoskeletal: No lower extremity tenderness nor edema.   Neurologic:  Normal speech and language.  Skin:  Skin is warm, dry and intact.  Psychiatric: Mood and affect are normal.   ____________________________________________   LABS (all labs  ordered are listed, but only abnormal results are displayed)  Labs Reviewed  COMPREHENSIVE METABOLIC PANEL - Abnormal; Notable for the following components:      Result Value   Glucose, Bld 477 (*)    ALT 16 (*)    All other components within normal limits  ACETAMINOPHEN LEVEL - Abnormal; Notable for the following components:   Acetaminophen (Tylenol), Serum <10 (*)    All other components within normal limits  URINE DRUG SCREEN, QUALITATIVE ([facility] ONLY) - Abnormal; Notable for the following components:   Cannabinoid 50 Ng, Ur [place] POSITIVE (*)    All other components within normal limits  GLUCOSE, CAPILLARY - Abnormal; Notable for the following components:   Glucose-Capillary 475 (*)    All other components within normal limits  ETHANOL  SALICYLATE LEVEL  CBC  CBG MONITORING, ED   ____________________________________________  EKG  nonw ____________________________________________  RADIOLOGY  ED MD interpretation:  none  Official radiology  report(s): No results found.  ____________________________________________   PROCEDURES  Procedure(s) performed: None  Procedures  Critical Care performed: No  ____________________________________________   INITIAL IMPRESSION / ASSESSMENT AND PLAN / ED COURSE  As part of my medical decision making, I reviewed the following data within the electronic MEDICAL RECORD NUMBER Notes from prior ED visits and [place] Controlled Substance Database   This is a 32 year old male with a history of bipolar disorder who comes into the hospital today with suicidal ideation.  The patient has been seen 15 times over the last 6 months.  We did check some blood work on the patient and his blood sugar was found to be 475.  His ethanol level is negative and his remaining blood work is also negative.  The patient was brought in under involuntary commitment.  After seeing the patient he became agitated and started saying that he wanted to leave.  We did  inform him that he would need to be seen by the specialist on-call for further evaluation.  He did come down and go to sleep but he initially refused any fluids, stated that he did not want any water and reports that we would not give him any insulin.     The patient was seen by the specialist on-call and he felt that the patient's threat of suicide was legitimate given that he was homeless and did not have much to live for.  He does understand that the patient does have some history of manipulation but is concerned and does recommend inpatient treatment for the patient.  He also recommends starting the patient on Effexor 150 mg in the morning and Haldol twice a day.  The patient will be seen by TTS. ____________________________________________   FINAL CLINICAL IMPRESSION(S) / ED DIAGNOSES  Final diagnoses:  Suicidal ideation  Agitation     ED Discharge Orders    None       Note:  This document was prepared using Dragon voice recognition software and may include unintentional dictation errors.    Loney Hering, MD 02/16/18 0700

## 2018-02-16 NOTE — ED Notes (Signed)
Patient awake alert and oriented, warm and dry, in no acute distress. Patient denies HI, AVH and pain. Patient does say he is "a little bit" SI. Patient made aware of Q15 minute rounds and Psychologist, counselling presence for their safety. Patient instructed to come to me with needs or concerns.

## 2018-02-16 NOTE — ED Notes (Signed)
Pt moved to 19 H bed. Pt has remained cooperative. Maintained on 15 minute checks and observation by security camera for safety.

## 2018-02-16 NOTE — ED Notes (Signed)

## 2018-02-16 NOTE — ED Notes (Addendum)
Hourly rounding reveals patient sleeping in hall bed. No complaints, stable, in no acute distress. Q15 minute rounds and monitoring via Rover and Officer to continue.  

## 2018-02-16 NOTE — ED Notes (Signed)
Hourly rounding reveals patient sleeping in hall. No complaints, stable, in no acute distress. Q15 minute rounds and monitoring via [employer] to continue.

## 2018-02-16 NOTE — ED Notes (Addendum)
Report from Amy B. RN. Patient sleeping, respirations regular and unlabored. Q15 minute rounds and observation by Rover and Officer to continue. 

## 2018-02-16 NOTE — ED Notes (Signed)
BEHAVIORAL HEALTH ROUNDING Patient sleeping: Yes.   Patient alert and oriented: not applicable SLEEPING Behavior appropriate: Yes.  ; If no, describe: SLEEPING Nutrition and fluids offered: No SLEEPING Toileting and hygiene offered: NoSLEEPING Sitter present: not applicable, Q 15 min safety rounds and observation. Law enforcement present: Yes ODS 

## 2018-02-16 NOTE — ED Notes (Addendum)
Hourly rounding reveals patient in room. No complaints, stable, in no acute distress. Q15 minute rounds and monitoring via Rover and Officer to continue.   

## 2018-02-16 NOTE — ED Notes (Signed)
Meal tray given 

## 2018-02-16 NOTE — ED Triage Notes (Addendum)
Pt was brought to ED by [employer] officer Gwendel Hanson for suicidal thoughts; IVC papers with officer; pt says he posted something on social media asking people for suggestions on how to kill himself; pt calm and cooperative; flat affect; pt says he's been out of his psych meds for 2-3 months

## 2018-02-16 NOTE — ED Notes (Signed)
Pt cooperative with ordered IV fluids and insulin. Maintained on 15 minute checks and observation by security camera for safety.

## 2018-02-16 NOTE — BH Assessment (Signed)
Assessment Note  Mark [name] is an 32 y.o. male who presents to the ER due to having SI, with plan of overdosing.  Patient states he has dealt with depression for an extended period of time. Patient is well known within the [facility] for frequent visits to the ER with similar presentation. He is also known to engage in self-injurious behaviors as means to be brought to the ER.  During the interview the patient was calm, cooperative and pleasant. He was able to provide appropriate answers to the questions.  Diagnosis: Depression  Past Medical History:  Past Medical History:  Diagnosis Date  . Depression   . Diabetes mellitus without complication ([facility])   . Hydrocephalus   . Kidney stones     Past Surgical History:  Procedure Laterality Date  . KIDNEY STONE SURGERY      Family History:  Family History  Problem Relation Age of Onset  . Breast cancer Mother     Social History:  reports that he has been smoking cigarettes.  He has been smoking about 0.75 packs per day. He has never used smokeless tobacco. He reports that he has current or past drug history. Drug: Marijuana. His alcohol history is not on file.  Additional Social History:  Alcohol / Drug Use Pain Medications: See PTA Prescriptions: See PTA Over the Counter: See PTA History of alcohol / drug use?: Yes Longest period of sobriety (when/how long): unknown Negative Consequences of Use: Legal Withdrawal Symptoms: (n/a)  CIWA: CIWA-Ar BP: 129/84 Pulse Rate: 94 COWS:    Allergies:  Allergies  Allergen Reactions  . Mushroom Extract Complex Hives, Itching and Nausea And Vomiting  . Penicillins Anaphylaxis, Hives and Swelling    Has patient had a PCN reaction causing immediate rash, facial/tongue/throat swelling, SOB or lightheadedness with hypotension: Yes Has patient had a PCN reaction causing severe rash involving mucus membranes or skin necrosis: No Has patient had a PCN reaction that required  hospitalization Yes Has patient had a PCN reaction occurring within the last 10 years: Yes If all of the above answers are "NO", then may proceed with Cephalosporin use.  . Sulfa Antibiotics Anaphylaxis and Hives  . Clindamycin/Lincomycin Hives    Home Medications:  (Not in a hospital admission)  OB/GYN Status:  No LMP for male patient.  General Assessment Data Location of Assessment: [facility] ED TTS Assessment: In system Is this a Tele or Face-to-Face Assessment?: Face-to-Face Is this an Initial Assessment or a Re-assessment for this encounter?: Initial Assessment Marital status: Single Maiden name: n/a Is patient pregnant?: No Living Arrangements: Alone Can pt return to current living arrangement?: Yes Admission Status: Involuntary Is patient capable of signing voluntary admission?: No(Under IVC) Referral Source: Self/Family/Friend Insurance type: Medicaid  Medical Screening Exam [facility] Walk-in ONLY) Medical Exam completed: Yes  Crisis Care Plan Living Arrangements: Alone Legal Guardian: Other:(Self) Name of Psychiatrist: Reports of none Name of Therapist: Reports of none  Education Status Is patient currently in school?: No Is the patient employed, unemployed or receiving disability?: Unemployed  Risk to self with the past 6 months Suicidal Ideation: Yes-Currently Present Has patient been a risk to self within the past 6 months prior to admission? : No Suicidal Intent: No Has patient had any suicidal intent within the past 6 months prior to admission? : No Is patient at risk for suicide?: No Suicidal Plan?: Yes-Currently Present Has patient had any suicidal plan within the past 6 months prior to admission? : Yes Specify Current Suicidal  Plan: Overdose on medication Access to Means: No What has been your use of drugs/alcohol within the last 12 months?: Reports of none Previous Attempts/Gestures: Yes How many times?: 2 Other Self Harm Risks: Reports of none Triggers  for Past Attempts: Other (Comment), Other personal contacts Intentional Self Injurious Behavior: None Family Suicide History: No Recent stressful life event(s): Other (Comment), Conflict (Comment)(Homeless) Persecutory voices/beliefs?: No Depression: Yes Depression Symptoms: Isolating, Guilt, Feeling angry/irritable Substance abuse history and/or treatment for substance abuse?: Yes Suicide prevention information given to non-admitted patients: Not applicable  Risk to Others within the past 6 months Homicidal Ideation: No Does patient have any lifetime risk of violence toward others beyond the six months prior to admission? : No Thoughts of Harm to Others: No Current Homicidal Intent: No Current Homicidal Plan: No Access to Homicidal Means: No Identified Victim: Reports of none History of harm to others?: No Assessment of Violence: None Noted Violent Behavior Description: Reports of none Does patient have access to weapons?: No Criminal Charges Pending?: No Does patient have a court date: No Is patient on probation?: No  Psychosis Hallucinations: None noted Delusions: None noted  Mental Status Report Appearance/Hygiene: Unremarkable, In scrubs Eye Contact: Fair Motor Activity: Freedom of movement Speech: Logical/coherent, Unremarkable Level of Consciousness: Alert Mood: Depressed, Sad Affect: Depressed, Sad Anxiety Level: Minimal Thought Processes: Coherent, Relevant Judgement: Unimpaired Orientation: Person, Place, Time, Situation, Appropriate for developmental age Obsessive Compulsive Thoughts/Behaviors: Minimal  Cognitive Functioning Concentration: Normal Memory: Recent Intact, Remote Intact Is patient IDD: No Is patient DD?: No Insight: Fair Impulse Control: Poor Appetite: Fair Have you had any weight changes? : No Change Sleep: No Change Total Hours of Sleep: 8 Vegetative Symptoms: None  ADLScreening [facility] Assessment Services) Patient's cognitive ability  adequate to safely complete daily activities?: Yes Patient able to express need for assistance with ADLs?: Yes Independently performs ADLs?: Yes (appropriate for developmental age)  Prior Inpatient Therapy Prior Inpatient Therapy: Yes Prior Therapy Dates: Multiple Hospitalizations Prior Therapy Facilty/Provider(s): [facility] Reason for Treatment: Depression  Prior Outpatient Therapy Prior Outpatient Therapy: No Does patient have an ACCT team?: No Does patient have Intensive In-House Services?  : No Does patient have [facility] services? : No Does patient have P4CC services?: No  ADL Screening (condition at time of admission) Patient's cognitive ability adequate to safely complete daily activities?: Yes Is the patient deaf or have difficulty hearing?: No Does the patient have difficulty seeing, even when wearing glasses/contacts?: No Does the patient have difficulty concentrating, remembering, or making decisions?: No Patient able to express need for assistance with ADLs?: Yes Does the patient have difficulty dressing or bathing?: No Independently performs ADLs?: Yes (appropriate for developmental age) Does the patient have difficulty walking or climbing stairs?: No Weakness of Legs: None Weakness of Arms/Hands: None  Home Assistive Devices/Equipment Home Assistive Devices/Equipment: None  Therapy Consults (therapy consults require a physician order) PT Evaluation Needed: No OT Evalulation Needed: No SLP Evaluation Needed: No Abuse/Neglect Assessment (Assessment to be complete while patient is alone) Abuse/Neglect Assessment Can Be Completed: Yes Physical Abuse: Denies Verbal Abuse: Denies Sexual Abuse: Denies Exploitation of patient/patient's resources: Denies Self-Neglect: Denies Values / Beliefs Cultural Requests During Hospitalization: None Spiritual Requests During Hospitalization: None Consults Spiritual Care Consult Needed: No Social Work Consult Needed: No          Child/Adolescent Assessment Running Away Risk: Denies(Patient is an adult)  Disposition:  Disposition Initial Assessment Completed for this Encounter: Yes  On Site Evaluation by:  Reviewed with Physician:    Lilyan Gilford MS, LCAS, LPC, NCC, CCSI Therapeutic Triage Specialist 02/16/2018 3:24 PM

## 2018-02-17 DIAGNOSIS — F4321 Adjustment disorder with depressed mood: Secondary | ICD-10-CM

## 2018-02-17 LAB — GLUCOSE, CAPILLARY
GLUCOSE-CAPILLARY: 51 mg/dL — AB (ref 65–99)
GLUCOSE-CAPILLARY: 264 mg/dL — AB (ref 65–99)
Glucose-Capillary: 115 mg/dL — ABNORMAL HIGH (ref 65–99)
Glucose-Capillary: 327 mg/dL — ABNORMAL HIGH (ref 65–99)
Glucose-Capillary: 412 mg/dL — ABNORMAL HIGH (ref 65–99)

## 2018-02-17 MED ORDER — HYDROXYZINE HCL 25 MG PO TABS
50.0000 mg | ORAL_TABLET | Freq: Three times a day (TID) | ORAL | Status: DC | PRN
  Administered 2018-02-17: 50 mg via ORAL
  Filled 2018-02-17: qty 2

## 2018-02-17 MED ORDER — INSULIN DETEMIR 100 UNIT/ML ~~LOC~~ SOLN
19.0000 [IU] | Freq: Every day | SUBCUTANEOUS | Status: DC
  Administered 2018-02-17 – 2018-02-19 (×3): 19 [IU] via SUBCUTANEOUS
  Filled 2018-02-17 (×4): qty 0.19

## 2018-02-17 MED ORDER — INSULIN ASPART 100 UNIT/ML ~~LOC~~ SOLN
0.0000 [IU] | Freq: Three times a day (TID) | SUBCUTANEOUS | Status: DC
  Administered 2018-02-17: 7 [IU] via SUBCUTANEOUS
  Filled 2018-02-17: qty 1

## 2018-02-17 MED ORDER — INSULIN ASPART 100 UNIT/ML ~~LOC~~ SOLN: 0.0000 [IU] | Freq: Every day | SUBCUTANEOUS | Status: DC

## 2018-02-17 MED ORDER — NICOTINE 14 MG/24HR TD PT24
14.0000 mg | MEDICATED_PATCH | Freq: Once | TRANSDERMAL | Status: AC
  Administered 2018-02-17: 14 mg via TRANSDERMAL
  Filled 2018-02-17: qty 1

## 2018-02-17 MED ORDER — QUETIAPINE FUMARATE 25 MG PO TABS
100.0000 mg | ORAL_TABLET | ORAL | Status: DC | PRN
  Administered 2018-02-17 – 2018-02-20 (×4): 100 mg via ORAL
  Filled 2018-02-17 (×4): qty 4

## 2018-02-17 MED ORDER — INSULIN ASPART 100 UNIT/ML ~~LOC~~ SOLN
7.0000 [IU] | Freq: Once | SUBCUTANEOUS | Status: AC
  Administered 2018-02-17: 7 [IU] via SUBCUTANEOUS

## 2018-02-17 MED ORDER — INSULIN ASPART 100 UNIT/ML ~~LOC~~ SOLN
0.0000 [IU] | Freq: Three times a day (TID) | SUBCUTANEOUS | Status: DC
  Administered 2018-02-17: 20 [IU] via SUBCUTANEOUS
  Administered 2018-02-18: 4 [IU] via SUBCUTANEOUS
  Administered 2018-02-18: 3 [IU] via SUBCUTANEOUS
  Administered 2018-02-18: 4 [IU] via SUBCUTANEOUS
  Administered 2018-02-19: 7 [IU] via SUBCUTANEOUS
  Administered 2018-02-19: 4 [IU] via SUBCUTANEOUS
  Administered 2018-02-19: 7 [IU] via SUBCUTANEOUS
  Administered 2018-02-20: 20 [IU] via SUBCUTANEOUS
  Filled 2018-02-17 (×8): qty 1

## 2018-02-17 NOTE — ED Notes (Signed)
Hourly rounding reveals patient sleeping in hall bed. No complaints, stable, in no acute distress. Q15 minute rounds and monitoring via Rover and Officer to continue.  

## 2018-02-17 NOTE — ED Notes (Signed)
Patient in dayroom, had been pacing and states that if He leaves He will come back with a knife in His stomach, Dr. Toni Amend Ordered seroquel, patient is safe at this time, will continue to monitor q 15 minute checks and camera surveillance in progress for safety.

## 2018-02-17 NOTE — ED Notes (Signed)
IVC/  PENDING  PLACEMENT 

## 2018-02-17 NOTE — ED Notes (Signed)
Hourly rounding reveals patient sleeping in room. No complaints, stable, in no acute distress. Q15 minute rounds and monitoring via Security Cameras to continue. 

## 2018-02-17 NOTE — ED Notes (Signed)
Report to include Situation, Background, Assessment, and Recommendations received from [facility]. Patient alert and oriented, warm and dry, in no acute distress. Patient denies HI, AVH and pain. Pt. states he has "a little" SI. Patient made aware of Q15 minute rounds and security cameras for their safety. Patient instructed to come to me with needs or concerns.

## 2018-02-17 NOTE — ED Notes (Signed)
Nurse talked with Patient and he states that he is depressed and having si thoughts, no plan at this time, Patient is homeless at this time and has no supportive family, He states that he would like to go to [employer] in [place] when He leaves here, Nurse will continue to monitor, q 15 minute checks and camera surveillance in progress for safety.

## 2018-02-17 NOTE — ED Notes (Signed)
Hourly rounding reveals patient in room. No complaints, stable, in no acute distress. Q15 minute rounds and monitoring via Security Cameras to continue. 

## 2018-02-17 NOTE — Consult Note (Signed)
Psychiatry: Addendum to previous note.  After speaking to the patient about discharge planning the patient lost his temper and became extremely agitated.  He began screaming over and over that he would kill himself and was very graphic in his description of how he would do it.  Made it very clear that he wanted me to feel responsible for this.  Attempted to argue about bed status.  Patient became so hostile that it became inappropriate to try and escort him out of the emergency room.  Agreed to allow him to sleep here tonight and be reassessed tomorrow while continuing current medicine.  Meanwhile I have adjusted his insulin sliding scale upward.  I have also been told by security staff that they are aware of outstanding warrants for the patient.  Follow-up tomorrow.

## 2018-02-17 NOTE — ED Notes (Signed)
Pt said he has a court date today and asked staff to fax the court notification that he is in the hospital. He was told that policy is to provide a letter but not to fax. Pt became irate and asked to speak to charge RN, who affirmed policy. Pt was further agitated and said that he would be going to jail as a result. Staff offered to make one phone call to the court, which was unsuccessful. Pt was angry that staff would not make numerous phone calls on his behalf and demanded to leave.   Pt was told by this Clinical research associate and security/law enforcement that he was under IVC. He said he could still leave. ED MD made aware of pt's desire to leave. Pt told by this writer that he is to be assessed by psychiatrist. Pt demanded to see psychiatrist immediately so he could be released now. He was told that wasn't possible. Pt is unhappy but calm now.

## 2018-02-17 NOTE — ED Notes (Signed)
Report called to [employer] in [place].

## 2018-02-17 NOTE — Progress Notes (Signed)
Inpatient Diabetes Program Recommendations  AACE/ADA: New Consensus Statement on Inpatient Glycemic Control (2015)  Target Ranges:  Prepandial:   less than 140 mg/dL      Peak postprandial:   less than 180 mg/dL (1-2 hours)      Critically ill patients:  140 - 180 mg/dL   Lab Results  Component Value Date   GLUCAP 115 (H) 02/17/2018   HGBA1C 8.7 (H) 09/11/2017    Review of Glycemic ControlResults for Mark [name], Mark [name] (MRN [id number]) as of 02/17/2018 10:35  Ref. Range 02/16/2018 11:12 02/16/2018 12:37 02/16/2018 16:36 02/16/2018 20:58 02/17/2018 07:57 02/17/2018 08:22  Glucose-Capillary Latest Ref Range: 65 - 99 mg/dL >191 (HH) 478 (H) 295 (H) 102 (H) 51 (L) 115 (H)    Diabetes history: Type 2 DM  Outpatient Diabetes medications:  Levemir 38 units q HS, Novolog 20 units tid with meals  Current orders for Inpatient glycemic control:  Levemir 19 units q HS, Novolog sensitive tid with meals and HS  Inpatient Diabetes Program Recommendations:    Note Levemir reduced due to low fasting CBG this morning.  Patient also received a total of 30 units of Novolog on 4/28 as well.  Consider increasing Levemir back to 28 units q HS and add Novolog meal coverage 5 units tid with meals (hold if patient eats less than 50%).   Thanks,  Beryl Meager, RN, BC-ADM Inpatient Diabetes Coordinator Pager [phone] (8a-5p)

## 2018-02-17 NOTE — ED Notes (Signed)
CBG recheck of 115 reported to MD.

## 2018-02-17 NOTE — ED Provider Notes (Signed)
-----------------------------------------   12:37 AM on 02/17/2018 -----------------------------------------   Blood pressure 124/81, pulse 88, temperature 98.1 F (36.7 C), temperature source Oral, resp. rate 20, height  (1.88 m), weight 104.3 kg (230 lb), SpO2 96 %.  The patient had no acute events since last update.  Calm and cooperative at this time.  Disposition is pending Psychiatry/Behavioral Medicine team recommendations.     Myrna Blazer, MD 02/17/18 [phone]
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
## 2018-02-17 NOTE — ED Notes (Signed)
Pt given sandwich tray and drink. 

## 2018-02-17 NOTE — ED Notes (Signed)
Nurse talked with Dr Toni Amend regarding Patient's blood sugar being 424 and he ordered 7 units of insulin one time dose, nurse will recheck blood sugar within the next hour.

## 2018-02-17 NOTE — ED Notes (Addendum)
Discussed patients CBG with Dr. Lamont Snowball. No additional medication ordered.

## 2018-02-17 NOTE — ED Notes (Signed)
Pt stated "When I get out of here I am going to [employer] to steal a knife and kill myself."

## 2018-02-18 LAB — GLUCOSE, CAPILLARY
GLUCOSE-CAPILLARY: 196 mg/dL — AB (ref 65–99)
GLUCOSE-CAPILLARY: 423 mg/dL — AB (ref 65–99)
GLUCOSE-CAPILLARY: 467 mg/dL — AB (ref 65–99)
Glucose-Capillary: 124 mg/dL — ABNORMAL HIGH (ref 65–99)
Glucose-Capillary: 413 mg/dL — ABNORMAL HIGH (ref 65–99)

## 2018-02-18 NOTE — ED Notes (Signed)
Hourly rounding reveals patient sleeping in room. No complaints, stable, in no acute distress. Q15 minute rounds and monitoring via Security Cameras to continue. 

## 2018-02-18 NOTE — ED Notes (Signed)
Patient verbalized passive SI.Contracts for safety.Denies HI and AVH.No aggressive behaviors noted.Safety maintained with 15 min checks and camera surveillance.

## 2018-02-18 NOTE — ED Notes (Signed)
IVC/Pending Placement 

## 2018-02-18 NOTE — ED Notes (Signed)
Hourly rounding reveals patient sleeping in room. No complaints, stable, in no acute distress. Q15 minute rounds and monitoring via Rover and Officer to continue.  

## 2018-02-18 NOTE — ED Notes (Signed)
Hold on discharge.

## 2018-02-18 NOTE — Discharge Instructions (Signed)
You have been seen in the emergency department for a  psychiatric concern. You have been evaluated both medically as well as psychiatrically. Please follow-up with your outpatient resources provided. Return to the emergency department for any worsening symptoms, or any thoughts of hurting yourself or anyone else so that we may attempt to help you. 

## 2018-02-18 NOTE — ED Provider Notes (Addendum)
-----------------------------------------   8:17 PM on 02/18/2018 -----------------------------------------  Per [facility] nurse patient has become agitated and is requesting to leave the emergency department.  Patient is voluntary currently.  Was taken off IVC yesterday per psychiatric notes.  Does not meet inpatient admission criteria.  As the patient is voluntary and wishing to leave we will allow the patient to be discharged from the emergency department.   Minna Antis, MD 02/18/18 2018   Patient now wanting to stay.  Glucose is elevated we will dose his nighttime Levemir.  We will recheck in the morning.   Minna Antis, MD 02/18/18 2150

## 2018-02-18 NOTE — ED Notes (Signed)
IVC/  PENDING  PLACEMENT 

## 2018-02-18 NOTE — ED Notes (Signed)
Consulted with Dr. Lenard Lance concerning CBG results. He advises to do Levemir and recheck CBG in AM.

## 2018-02-18 NOTE — ED Notes (Signed)
PT IVC/ PENDING PLACEMENT  

## 2018-02-18 NOTE — ED Notes (Addendum)
Approached patient about not urinating in floor of rest room repeatedly . Pt. Became upset about our request and cursed at this nurse. Told patient since he is here voluntarily if he would like he can leave. Patient states he wants to leave. Communicated with EDP Paduchowsky who will finish up discharge.

## 2018-02-18 NOTE — ED Notes (Signed)
Report to include Situation, Background, Assessment, and Recommendations received from Lynita Lombard RN. Patient alert and oriented, warm and dry, in no acute distress. Patient denies HI, AVH and pain. Patient states he has "a little" SI without plan. Patient made aware of Q15 minute rounds and security cameras for their safety. Patient instructed to come to me with needs or concerns.

## 2018-02-19 LAB — GLUCOSE, CAPILLARY
GLUCOSE-CAPILLARY: 203 mg/dL — AB (ref 65–99)
Glucose-Capillary: >600 mg/dL (ref 65–99)
Glucose-Capillary: 195 mg/dL — ABNORMAL HIGH (ref 65–99)
Glucose-Capillary: 224 mg/dL — ABNORMAL HIGH (ref 65–99)

## 2018-02-19 MED ORDER — NICOTINE 14 MG/24HR TD PT24
14.0000 mg | MEDICATED_PATCH | Freq: Once | TRANSDERMAL | Status: DC
Start: 2018-02-19 — End: 2018-02-20
  Filled 2018-02-19: qty 1

## 2018-02-19 NOTE — ED Notes (Signed)
Pt. Sleeping in room.

## 2018-02-19 NOTE — ED Notes (Signed)
Hourly rounding reveals patient sleeping in room. No complaints, stable, in no acute distress. Q15 minute rounds and monitoring via Security Cameras to continue. 

## 2018-02-19 NOTE — ED Notes (Signed)
Pt given diet coke. Pt resting in bed watching tv.

## 2018-02-19 NOTE — ED Notes (Signed)
Pt to nurses station for medication. Another patient began cursing at him. Pt became agitated and began cursing back. Pt took medication and returned to room. Maintained on 15 minute checks and observation by security camera for safety.

## 2018-02-19 NOTE — Progress Notes (Signed)
Inpatient Diabetes Program Recommendations  AACE/ADA: New Consensus Statement on Inpatient Glycemic Control (2015)  Target Ranges:  Prepandial:   less than 140 mg/dL      Peak postprandial:   less than 180 mg/dL (1-2 hours)      Critically ill patients:  140 - 180 mg/dL   Lab Results  Component Value Date   GLUCAP 195 (H) 02/19/2018   HGBA1C 8.7 (H) 09/11/2017    Review of Glycemic Control  Diabetes history: Type 2 DM  Outpatient Diabetes medications:  Levemir 38 units q HS, Novolog 20 units tid with meals  Current orders for Inpatient glycemic control:  Levemir 19 units q HS, Novolog resistant tid with meals   Inpatient Diabetes Program Recommendations:   -Increase Levemir back to 28 units q HS -add Novolog meal coverage 5 units tid with meals (hold if patient eats less than 50%)  Thank you, Darel Hong E. Vanderbilt Ranieri, RN, MSN, CDE  Diabetes Coordinator Inpatient Glycemic Control Team Team Pager [phone] (8am-5pm) 02/19/2018 10:32 AM

## 2018-02-19 NOTE — ED Provider Notes (Signed)
-----------------------------------------   6:15 AM on 02/19/2018 -----------------------------------------   Blood pressure 115/66, pulse 80, temperature 98.3 F (36.8 C), temperature source Oral, resp. rate 16, height 1.88 m ( ), weight 104.3 kg (230 lb), SpO2 100 %.  The patient had no acute events since last update.  Calm and cooperative at this time.  The patient is voluntary and has active arrest warrants.  He initially said he wanted to leave the department but then changed his mind.  If he decides to leave, he may do so but [employer] Department should be notified prior to his discharge in case they want to follow-up on the warrants.     [name] Rose, MD 02/19/18 [phone]

## 2018-02-19 NOTE — ED Notes (Signed)
Patient resting quietly in room. No noted distress or abnormal behaviors noted. Will continue 15 minute checks and observation by security camera for safety. 

## 2018-02-19 NOTE — ED Notes (Signed)
IVC/Pending placement 

## 2018-02-19 NOTE — ED Notes (Signed)
Pt given meal tray and diet coke.

## 2018-02-19 NOTE — ED Notes (Signed)
Pt. Up using bathroom, pt. Returned to room with steady gait.  Pt. Requested and was given a packet of graham crackers.

## 2018-02-20 ENCOUNTER — Other Ambulatory Visit: Payer: Self-pay

## 2018-02-20 ENCOUNTER — Emergency Department
Admission: EM | Admit: 2018-02-20 | Discharge: 2018-02-20 | Payer: Medicaid Other | Attending: Emergency Medicine | Admitting: Emergency Medicine

## 2018-02-20 DIAGNOSIS — E1165 Type 2 diabetes mellitus with hyperglycemia: Secondary | ICD-10-CM | POA: Insufficient documentation

## 2018-02-20 DIAGNOSIS — R739 Hyperglycemia, unspecified: Secondary | ICD-10-CM

## 2018-02-20 DIAGNOSIS — Z794 Long term (current) use of insulin: Secondary | ICD-10-CM | POA: Insufficient documentation

## 2018-02-20 DIAGNOSIS — F17210 Nicotine dependence, cigarettes, uncomplicated: Secondary | ICD-10-CM | POA: Insufficient documentation

## 2018-02-20 DIAGNOSIS — Z79899 Other long term (current) drug therapy: Secondary | ICD-10-CM | POA: Insufficient documentation

## 2018-02-20 LAB — GLUCOSE, CAPILLARY
GLUCOSE-CAPILLARY: 97 mg/dL (ref 65–99)
Glucose-Capillary: 352 mg/dL — ABNORMAL HIGH (ref 65–99)
Glucose-Capillary: 462 mg/dL — ABNORMAL HIGH (ref 65–99)
Glucose-Capillary: 555 mg/dL (ref 65–99)
Glucose-Capillary: 464 mg/dL — ABNORMAL HIGH (ref 65–99)
Glucose-Capillary: 422 mg/dL — ABNORMAL HIGH (ref 65–99)

## 2018-02-20 LAB — BASIC METABOLIC PANEL
ANION GAP: 13 (ref 5–15)
BUN: 19 mg/dL (ref 6–20)
CALCIUM: 9.4 mg/dL (ref 8.9–10.3)
CO2: 24 mmol/L (ref 22–32)
Chloride: 96 mmol/L — ABNORMAL LOW (ref 101–111)
Creatinine, Ser: 1.03 mg/dL (ref 0.61–1.24)
GFR calc non Af Amer: >60 mL/min (ref >60)
Glucose, Bld: 523 mg/dL (ref 65–99)
Potassium: 4.4 mmol/L (ref 3.5–5.1)
Sodium: 133 mmol/L — ABNORMAL LOW (ref 135–145)

## 2018-02-20 MED ORDER — SODIUM CHLORIDE 0.9 % IV BOLUS
1000.0000 mL | Freq: Once | INTRAVENOUS | Status: AC
  Administered 2018-02-20: 1000 mL via INTRAVENOUS

## 2018-02-20 MED ORDER — VENLAFAXINE HCL ER 150 MG PO CP24: 300.0000 mg | ORAL_CAPSULE | Freq: Every day | ORAL | 0 refills | Status: DC

## 2018-02-20 MED ORDER — INSULIN DETEMIR 100 UNIT/ML ~~LOC~~ SOLN: 19.0000 [IU] | Freq: Every day | SUBCUTANEOUS | 1 refills | Status: DC

## 2018-02-20 MED ORDER — INSULIN ASPART 100 UNIT/ML ~~LOC~~ SOLN
12.0000 [IU] | Freq: Once | SUBCUTANEOUS | Status: AC
  Administered 2018-02-20: 12 [IU] via SUBCUTANEOUS

## 2018-02-20 MED ORDER — INSULIN ASPART 100 UNIT/ML ~~LOC~~ SOLN
8.0000 [IU] | Freq: Once | SUBCUTANEOUS | Status: AC
  Administered 2018-02-20: 8 [IU] via INTRAVENOUS
  Filled 2018-02-20: qty 1

## 2018-02-20 MED ORDER — INSULIN ASPART 100 UNIT/ML ~~LOC~~ SOLN
SUBCUTANEOUS | Status: AC
  Filled 2018-02-20: qty 1

## 2018-02-20 MED ORDER — QUETIAPINE FUMARATE 100 MG PO TABS: 100.0000 mg | ORAL_TABLET | ORAL | 0 refills | Status: DC | PRN

## 2018-02-20 MED ORDER — GABAPENTIN 400 MG PO CAPS: 1200.0000 mg | ORAL_CAPSULE | Freq: Three times a day (TID) | ORAL | 0 refills | Status: DC

## 2018-02-20 MED ORDER — TRAZODONE HCL 100 MG PO TABS: 100.0000 mg | ORAL_TABLET | Freq: Every evening | ORAL | 0 refills | Status: DC | PRN

## 2018-02-20 MED ORDER — INSULIN ASPART 100 UNIT/ML ~~LOC~~ SOLN: 0.0000 [IU] | Freq: Three times a day (TID) | SUBCUTANEOUS | 1 refills | Status: DC

## 2018-02-20 NOTE — ED Notes (Signed)
Test: glucose Critical Value: 523  Name of Provider Notified: [email or web address]

## 2018-02-20 NOTE — ED Notes (Signed)
Pt has spoken with Dr. Toni Amend and understands he will be discharged. Interacting with peer and staff in dayroom. Maintained on 15 minute checks and observation by security camera for safety.

## 2018-02-20 NOTE — ED Notes (Addendum)
Pt sent here for med clearance for DKA ruleout prior to [facility] jail. Nurse at jail unwilling to use pt own insulin he brought with him,. Pt in NAD, denies N/V, skin warm and dry, does not appear to be in DKA and states that he is not. Officer at bedside

## 2018-02-20 NOTE — BH Assessment (Signed)
Per Dr. Shary Key patient will be discharged today.

## 2018-02-20 NOTE — ED Notes (Signed)
Spoke with Susie, nurse at [facility] regarding pt glucose status continuous trend down. Pt has remained asymptomatic throughout stay, cleared for d/c by EDP. VSS. Patient at baseline. Contact number left for nurses to call this RN back with questions. Understanding verbalized. Pt d/c in custody with [employer] officer.   Pt. Verbalizes understanding of d/c instructions, medications, and follow-up. VS stable and pain controlled per pt.  Pt. In NAD at time of d/c and denies further concerns regarding this visit. Pt. Stable at the time of departure from the unit, departing unit by the safest and most appropriate manner per that pt condition and limitations with all belongings accounted for. Pt advised to return to the ED at any time for emergent concerns, or for new/worsening symptoms. D/C consent not signed d/t patient custody status and lack of free will.

## 2018-02-20 NOTE — ED Provider Notes (Signed)
-----------------------------------------   6:40 AM on 02/20/2018 -----------------------------------------   Blood pressure 115/66, pulse 80, temperature 98.3 F (36.8 C), temperature source Oral, resp. rate 16, height  (1.88 m), weight 104.3 kg (230 lb), SpO2 100 %.  The patient had no acute events since last update.  Calm and cooperative at this time.  Disposition is pending Psychiatry/Behavioral Medicine team recommendations.     Irean Hong, MD 02/20/18 [phone]

## 2018-02-20 NOTE — ED Notes (Signed)
Student PA at bedside talking with pt at this time

## 2018-02-20 NOTE — ED Notes (Signed)
Pt is making calls to local homeless shelters.

## 2018-02-20 NOTE — ED Notes (Signed)
Patient resting quietly in room. No noted distress or abnormal behaviors noted. Will continue 15 minute checks and observation by security camera for safety. 

## 2018-02-20 NOTE — ED Notes (Signed)
Pt compliant with VS and medications. No behavioral issues at this time. Maintained on 15 minute checks and observation by security camera for safety.

## 2018-02-20 NOTE — ED Notes (Addendum)
Pt refused VS at departure. PT in NAD, ambualtory. Pt upset because he is being discharged.MD aware. This RN has given pt bus voucher  at discharge and told pt to go to shelter. PT verbalizes d/c understanding of RX given and bus voucher . Pt waiting for courtesy vehicle in lobby, first RN aware

## 2018-02-20 NOTE — ED Notes (Signed)
Pt requesting not to be discharged because he is still having thoughts of hurting himself.  Pt administered PRN Seroquel per his request for anxiety. Pt is taking a shower. Calm and cooperative. Maintained on 15 minute checks and observation by security camera for safety.

## 2018-02-20 NOTE — ED Notes (Signed)
Per social worker pt able to go to homeless shelter in person to see if there are beds available when discharged

## 2018-02-20 NOTE — ED Triage Notes (Signed)
Pt here for medical clearance states had fsbs checked at the jail at it was 495.

## 2018-02-20 NOTE — ED Notes (Signed)
Pt dressing into personal clothes at this time

## 2018-02-20 NOTE — ED Provider Notes (Addendum)
[facility] Emergency Department Provider Note  ____________________________________________   I have reviewed the triage vital signs and the nursing notes. Where available I have reviewed prior notes and, if possible and indicated, outside hospital notes.    HISTORY  Chief Complaint Medical Clearance    HPI Mark [name] is a 32 y.o. male  poorly controlled diabetes had a Swiss Roll and candy after discharge from this department, is now in police custody for reasons unclear to me, and they noted his sugar is high he is asymptomatic with that. happened after consuming sugary treats.      Past Medical History:  Diagnosis Date  . Depression   . Diabetes mellitus without complication ([place])   . Hydrocephalus   . Kidney stones     Patient Active Problem List   Diagnosis Date Noted  . Suicidal ideation   . Major depressive disorder, recurrent severe without psychotic features ([name])   . Adjustment disorder with depressed mood 12/07/2017  . Noncompliance 10/17/2017  . Dysthymia 10/08/2017  . Personality disorder ([place]) 09/26/2017  . 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 Obstructive hydrocephalus 09/19/2017  . Diabetes ([place]) 09/11/2017  . Hypoglycemia 09/09/2017  . MDD (major depressive disorder) 08/22/2017  . Severe recurrent major depression with psychotic features ([place]) 08/19/2017  . Nausea & vomiting 08/18/2017  . Abdominal pain 08/18/2017  . Hyponatremia 08/18/2017  . DKA (diabetic ketoacidoses) ([place]) 11/24/2015  . Diabetic keto-acidosis ([place]) 10/23/2015    Past Surgical History:  Procedure Laterality Date  . KIDNEY STONE SURGERY      Prior to Admission medications   Medication Sig Start Date End Date Taking? Authorizing Provider  blood glucose meter kit and supplies KIT Dispense based on patient and insurance preference. Use up to four times daily as directed. (FOR ICD-9 250.00, 250.01). 01/27/18   Darel Hong, MD   diphenhydramine-acetaminophen (TYLENOL PM) 25-500 MG TABS tablet Take 2 tablets by mouth at bedtime as needed (for sleep).    [provider]  gabapentin (NEURONTIN) 400 MG capsule Take 3 capsules (1,200 mg total) by mouth 3 (three) times daily. 02/20/18   Clapacs, Madie Reno, MD  haloperidol decanoate (HALDOL DECANOATE) 100 MG/ML injection Inject 1 mL (100 mg total) into the muscle every 28 (twenty-eight) days. Patient not taking: Reported on 12/06/2017 09/25/17   Pucilowska, Herma Ard B, MD  insulin aspart (NOVOLOG) 100 UNIT/ML injection Inject 0-20 Units into the skin 3 (three) times daily with meals. 02/20/18   Clapacs, Madie Reno, MD  insulin detemir (LEVEMIR) 100 UNIT/ML injection Inject 0.19 mLs (19 Units total) into the skin at bedtime. 02/20/18   Clapacs, Madie Reno, MD  Insulin Pen Needle (NOVOFINE) 30G X 8 MM MISC Inject 10 each into the skin as needed. 12/31/17   Hillary Bow, MD  QUEtiapine (SEROQUEL) 100 MG tablet Take 1 tablet (100 mg total) by mouth every 4 (four) hours as needed (anxiety). 02/20/18   Clapacs, Madie Reno, MD  traZODone (DESYREL) 100 MG tablet Take 1 tablet (100 mg total) by mouth at bedtime as needed for sleep. 02/20/18   Clapacs, Madie Reno, MD  venlafaxine XR (EFFEXOR-XR) 150 MG 24 hr capsule Take 2 capsules (300 mg total) by mouth daily with breakfast. 02/20/18   Clapacs, Madie Reno, MD    Allergies Mushroom extract complex; Penicillins; Sulfa antibiotics; and Clindamycin/lincomycin  Family History  Problem Relation Age of Onset  . Breast cancer Mother     Social History Social History   Tobacco Use  . Smoking  status: Current Every Day Smoker    Packs/day: 0.75    Types: Cigarettes  . Smokeless tobacco: Never Used  Substance Use Topics  . Alcohol use: Not on file    Comment: occassional   . Drug use: Yes    Types: Marijuana    Comment: smoked 2-3 days ago    Review of Systems Constitutional: No fever/chills Eyes: No visual changes. ENT: No sore throat. No stiff neck no  neck pain Cardiovascular: Denies chest pain. Respiratory: Denies shortness of breath. Gastrointestinal:   no vomiting.  No diarrhea.  No constipation. Genitourinary: Negative for dysuria. Musculoskeletal: Negative lower extremity swelling Skin: Negative for rash. Neurological: Negative for severe headaches, focal weakness or numbness.   ____________________________________________   PHYSICAL EXAM:  VITAL SIGNS: ED Triage Vitals  Enc Vitals Group     BP 02/20/18 1935 (!) 127/95     Pulse Rate 02/20/18 1935 (!) 108     Resp 02/20/18 1935 20     Temp 02/20/18 1935 99.3 F (37.4 C)     Temp Source 02/20/18 1935 Oral     SpO2 02/20/18 1935 97 %     Weight 02/20/18 1937 230 lb (104.3 kg)     Height 02/20/18 1937 6' 2"  (1.88 m)     Head Circumference --      Peak Flow --      Pain Score 02/20/18 1936 0     Pain Loc --      Pain Edu? --      Excl. in [place]? --     Constitutional: Alert and oriented. Well appearing and in no acute distress. Eyes: Conjunctivae are normal Head: Atraumatic HEENT: No congestion/rhinnorhea. Mucous membranes are moist.  Oropharynx non-erythematous Neck:   Nontender with no meningismus, no masses, no stridor Cardiovascular: Normal rate, regular rhythm. Grossly normal heart sounds.  Good peripheral circulation. Respiratory: Normal respiratory effort.  No retractions. Lungs CTAB. Abdominal: Soft and nontender. No distention. No guarding no rebound Back:  There is no focal tenderness or step off.  there is no midline tenderness there are no lesions noted. there is no CVA tenderness Musculoskeletal: No lower extremity tenderness, no upper extremity tenderness. No joint effusions, no DVT signs strong distal pulses no edema Neurologic:  Normal speech and language. No gross focal neurologic deficits are appreciated.  Skin:  Skin is warm, dry and intact. No rash noted. Psychiatric: Mood and affect are normal. Speech and behavior are  normal.  ____________________________________________   LABS (all labs ordered are listed, but only abnormal results are displayed)  Labs Reviewed  GLUCOSE, CAPILLARY - Abnormal; Notable for the following components:      Result Value   Glucose-Capillary 462 (*)    All other components within normal limits  BASIC METABOLIC PANEL    Pertinent labs  results that were available during my care of the patient were reviewed by me and considered in my medical decision making (see chart for details). ____________________________________________  EKG  I personally interpreted any EKGs ordered by me or triage  ____________________________________________  RADIOLOGY  Pertinent labs & imaging results that were available during my care of the patient were reviewed by me and considered in my medical decision making (see chart for details). If possible, patient and/or family made aware of any abnormal findings.  No results found. ____________________________________________    PROCEDURES  Procedure(s) performed: None  Procedures  Critical Care performed: None  ____________________________________________   INITIAL IMPRESSION / ASSESSMENT AND PLAN / ED COURSE  Pertinent labs & imaging results that were available during my care of the patient were reviewed by me and considered in my medical decision making (see chart for details).  patient here for asymptomatic hyperglycemia. Low suspicion for DKA etc., we'll give him insulin IV fluid check and make sure he does not have an anion gap. His sugar down, and sent him back to jail. Patient in no acute distress  ----------------------------------------- 9:08 PM on 02/20/2018 -----------------------------------------  no evidence of DKA no anion gap, patient receiving IV fluid. I did initially give him 8 units of insulin but upon reviewing the patient's usual and sternal requirements with the pharmacist it turns out that really for this  level of sugar 24 units is what he normally gets. We don't want a bottom him out if he is going back to jail so give him another 12 units which give a total 20 to still somewhat less than normally gets but with the fluid is my hope that we can start bringing sugar down. Patient admits eating a great deal of sugar and candy after he was discharged and this included candy and Swiss rolls, so certainly not unusual to find elevated sugar in a diabetic patient is noncompliant with his insulin and eating candy.  ----------------------------------------- 11:14 PM on 02/20/2018 -----------------------------------------  Unclear what the goal is. He has no sx or signs of acute disease. We have given him 20 units of insulin and his sugars are trending downward. They were going up when he came in. This is directly attributable to eating large amounts of carbs just before arrival.  The prison has a nurse and can check and follow bgm as well as [place]. We will d/w prison nurse if possible what they are comfortable with. No evidence of dka, but we did give him 2 l of ivf and 20 of insulin here, per his usual sliding scale. Marland Kitchen   /b ----------------------------------------- 11:21 PM on 02/20/2018 -----------------------------------------  D/w prison nurse, they feel very comfortable taking him. Will dc.    ____________________________________________   FINAL CLINICAL IMPRESSION(S) / ED DIAGNOSES  Final diagnoses:  None      This chart was dictated using voice recognition software.  Despite best efforts to proofread,  errors can occur which can change meaning.      Schuyler Amor, MD 02/20/18 2012    Schuyler Amor, MD 02/20/18 2110    Schuyler Amor, MD 02/20/18 2315    Schuyler Amor, MD 02/20/18 2322

## 2018-02-20 NOTE — Consult Note (Signed)
[facility] Consult   Reason for Consult: Follow-up consult 32 year old man with history of chronic behavior problems and multiple medical issues who has been in the emergency room Referring Physician: Corky Downs Patient Identification: LADALE SHERBURN MRN:  [id number] Principal Diagnosis: Adjustment disorder with depressed mood Diagnosis:   Patient Active Problem List   Diagnosis Date Noted  . Suicidal ideation [R45.851]   . Major depressive disorder, recurrent severe without psychotic features ([place]) [F33.2]   . Adjustment disorder with depressed mood [F43.21] 12/07/2017  . Noncompliance [Z91.19] 10/17/2017  . Dysthymia [F34.1] 10/08/2017  . Personality disorder ([place]) [F60.9] 09/26/2017  . 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 Obstructive hydrocephalus [G91.1] 09/19/2017  . Diabetes ([place]) [E11.9] 09/11/2017  . Hypoglycemia [E16.2] 09/09/2017  . MDD (major depressive disorder) [F32.9] 08/22/2017  . Severe recurrent major depression with psychotic features ([place]) [F33.3] 08/19/2017  . Nausea & vomiting [R11.2] 08/18/2017  . Abdominal pain [R10.9] 08/18/2017  . Hyponatremia [E87.1] 08/18/2017  . DKA (diabetic ketoacidoses) ([place]) [E13.10] 11/24/2015  . Diabetic keto-acidosis ([place]) [E13.10] 10/23/2015    Total Time spent with patient: 30 minutes  Subjective:   EMILE KYLLO is a 32 y.o. male patient admitted with "I do not think I am ready to go yet".  HPI: Reassessment of this patient very well known to the psychiatry service.  He has been in the emergency room for several days now.  Last time I tried to assess him face-to-face he was screaming at me that he would kill himself.  This delayed discharge planning.  Since then the patient has mostly been withdrawn to his bedroom watching television eating with poor hygiene.  He is awake and alert and not delirious.  When confronted he will still vaguely talk about being suicidal but when it comes to making plans he is clearly  able to come up with thoughts and plans for trying to take care of himself in the community.  Patient appears to be at his baseline and as reported previously is very unlikely to benefit from inpatient treatment.  His blood sugars in general have improved  Past Psychiatric History: Multiple hospitalizations multiple attempts to work with this patient with minimal improvement in large part because of his lack of motivation and unwillingness to follow up seriously  Risk to Self: Suicidal Ideation: Yes-Currently Present Suicidal Intent: No Is patient at risk for suicide?: No Suicidal Plan?: Yes-Currently Present Specify Current Suicidal Plan: Overdose on medication Access to Means: No What has been your use of drugs/alcohol within the last 12 months?: Reports of none How many times?: 2 Other Self Harm Risks: Reports of none Triggers for Past Attempts: Other (Comment), Other personal contacts Intentional Self Injurious Behavior: None Risk to Others: Homicidal Ideation: No Thoughts of Harm to Others: No Current Homicidal Intent: No Current Homicidal Plan: No Access to Homicidal Means: No Identified Victim: Reports of none History of harm to others?: No Assessment of Violence: None Noted Violent Behavior Description: Reports of none Does patient have access to weapons?: No Criminal Charges Pending?: No Does patient have a court date: No Prior Inpatient Therapy: Prior Inpatient Therapy: Yes Prior Therapy Dates: Multiple Hospitalizations Prior Therapy Facilty/Provider(s): [facility] Reason for Treatment: Depression Prior Outpatient Therapy: Prior Outpatient Therapy: No Does patient have an ACCT team?: No Does patient have Intensive In-House Services?  : No Does patient have [facility] services? : No Does patient have P4CC services?: No  Past Medical History:  Past Medical History:  Diagnosis Date  .  Depression   . Diabetes mellitus without complication ([place])   . Hydrocephalus   .  Kidney stones     Past Surgical History:  Procedure Laterality Date  . KIDNEY STONE SURGERY     Family History:  Family History  Problem Relation Age of Onset  . Breast cancer Mother    Family Psychiatric  History: None known Social History:  Social History   Substance and Sexual Activity  Alcohol Use Not on file   Comment: occassional      Social History   Substance and Sexual Activity  Drug Use Yes  . Types: Marijuana   Comment: smoked 2-3 days ago    Social History   Socioeconomic History  . Marital status: Single    Spouse name: Not on file  . Number of children: Not on file  . Years of education: Not on file  . Highest education level: Not on file  Occupational History  . Not on file  Social Needs  . Financial resource strain: Not on file  . Food insecurity:    Worry: Not on file    Inability: Not on file  . Transportation needs:    Medical: Not on file    Non-medical: Not on file  Tobacco Use  . Smoking status: Current Every Day Smoker    Packs/day: 0.75    Types: Cigarettes  . Smokeless tobacco: Never Used  Substance and Sexual Activity  . Alcohol use: Not on file    Comment: occassional   . Drug use: Yes    Types: Marijuana    Comment: smoked 2-3 days ago  . Sexual activity: Not on file  Lifestyle  . Physical activity:    Days per week: Not on file    Minutes per session: Not on file  . Stress: Not on file  Relationships  . Social connections:    Talks on phone: Not on file    Gets together: Not on file    Attends religious service: Not on file    Active member of club or organization: Not on file    Attends meetings of clubs or organizations: Not on file    Relationship status: Not on file  Other Topics Concern  . Not on file  Social History Narrative  . Not on file   Additional Social History:    Allergies:   Allergies  Allergen Reactions  . Mushroom Extract Complex Hives, Itching and Nausea And Vomiting  . Penicillins  Anaphylaxis, Hives and Swelling    Has patient had a PCN reaction causing immediate rash, facial/tongue/throat swelling, SOB or lightheadedness with hypotension: Yes Has patient had a PCN reaction causing severe rash involving mucus membranes or skin necrosis: No Has patient had a PCN reaction that required hospitalization Yes Has patient had a PCN reaction occurring within the last 10 years: Yes If all of the above answers are "NO", then may proceed with Cephalosporin use.  . Sulfa Antibiotics Anaphylaxis and Hives  . Clindamycin/Lincomycin Hives    Labs:  Results for orders placed or performed during the hospital encounter of 02/16/18 (from the past 48 hour(s))  Glucose, capillary     Status: Abnormal   Collection Time: 02/18/18  6:29 PM  Result Value Ref Range   Glucose-Capillary >600 (HH) 65 - 99 mg/dL    Comment: QUESTIONABLE RESULTS - CHARGE CREDITED Performed at [facility] [address], [place], [place] [address]   Glucose, capillary     Status: Abnormal  Collection Time: 02/18/18  6:32 PM  Result Value Ref Range   Glucose-Capillary 413 (H) 65 - 99 mg/dL   Comment 1 Notify RN   Glucose, capillary     Status: Abnormal   Collection Time: 02/18/18  6:34 PM  Result Value Ref Range   Glucose-Capillary 423 (H) 65 - 99 mg/dL   Comment 1 Notify RN   Glucose, capillary     Status: Abnormal   Collection Time: 02/18/18  9:13 PM  Result Value Ref Range   Glucose-Capillary 467 (H) 65 - 99 mg/dL  Glucose, capillary     Status: Abnormal   Collection Time: 02/19/18  8:06 AM  Result Value Ref Range   Glucose-Capillary 195 (H) 65 - 99 mg/dL  Glucose, capillary     Status: Abnormal   Collection Time: 02/19/18 11:39 AM  Result Value Ref Range   Glucose-Capillary 203 (H) 65 - 99 mg/dL  Glucose, capillary     Status: Abnormal   Collection Time: 02/19/18  4:32 PM  Result Value Ref Range   Glucose-Capillary 224 (H) 65 - 99 mg/dL  Glucose, capillary     Status: Abnormal    Collection Time: 02/20/18  8:10 AM  Result Value Ref Range   Glucose-Capillary 352 (H) 65 - 99 mg/dL  Glucose, capillary     Status: None   Collection Time: 02/20/18 11:54 AM  Result Value Ref Range   Glucose-Capillary 97 65 - 99 mg/dL    Current Facility-Administered Medications  Medication Dose Route Frequency Provider Last Rate Last Dose  . gabapentin (NEURONTIN) capsule 1,200 mg  1,200 mg Oral TID Lavonia Drafts, MD   1,200 mg at 02/20/18 0910  . hydrOXYzine (ATARAX/VISTARIL) tablet 50 mg  50 mg Oral TID PRN Clapacs, Madie Reno, MD   50 mg at 02/17/18 1508  . insulin aspart (novoLOG) injection 0-20 Units  0-20 Units Subcutaneous TID WC Clapacs, Madie Reno, MD   20 Units at 02/20/18 0818  . insulin detemir (LEVEMIR) injection 19 Units  19 Units Subcutaneous QHS Alfred Levins, [place], MD   19 Units at 02/19/18 2139  . nicotine (NICODERM CQ - dosed in mg/24 hours) patch 14 mg  14 mg Transdermal Once Clapacs, [name] T, MD      . QUEtiapine (SEROQUEL) tablet 100 mg  100 mg Oral Q4H PRN Clapacs, Madie Reno, MD   100 mg at 02/20/18 1130  . traZODone (DESYREL) tablet 100 mg  100 mg Oral QHS PRN Lavonia Drafts, MD   100 mg at 02/16/18 2135  . venlafaxine XR (EFFEXOR-XR) 24 hr capsule 300 mg  300 mg Oral Q breakfast Lavonia Drafts, MD   300 mg at 02/20/18 1191   Current Outpatient Medications  Medication Sig Dispense Refill  . blood glucose meter kit and supplies KIT Dispense based on patient and insurance preference. Use up to four times daily as directed. (FOR ICD-9 250.00, 250.01). 1 each 0  . diphenhydramine-acetaminophen (TYLENOL PM) 25-500 MG TABS tablet Take 2 tablets by mouth at bedtime as needed (for sleep).    . gabapentin (NEURONTIN) 400 MG capsule Take 3 capsules (1,200 mg total) by mouth 3 (three) times daily. 90 capsule 0  . haloperidol decanoate (HALDOL DECANOATE) 100 MG/ML injection Inject 1 mL (100 mg total) into the muscle every 28 (twenty-eight) days. (Patient not taking: Reported on 12/06/2017)  1 mL 1  . insulin aspart (NOVOLOG) 100 UNIT/ML injection Inject 0-20 Units into the skin 3 (three) times daily with meals. 10 mL 1  .  insulin detemir (LEVEMIR) 100 UNIT/ML injection Inject 0.19 mLs (19 Units total) into the skin at bedtime. 10 mL 1  . Insulin Pen Needle (NOVOFINE) 30G X 8 MM MISC Inject 10 each into the skin as needed. 100 each 0  . QUEtiapine (SEROQUEL) 100 MG tablet Take 1 tablet (100 mg total) by mouth every 4 (four) hours as needed (anxiety). 60 tablet 0  . traZODone (DESYREL) 100 MG tablet Take 1 tablet (100 mg total) by mouth at bedtime as needed for sleep. 30 tablet 0  . venlafaxine XR (EFFEXOR-XR) 150 MG 24 hr capsule Take 2 capsules (300 mg total) by mouth daily with breakfast. 60 capsule 0    Musculoskeletal: Strength & Muscle Tone: within normal limits Gait & Station: normal Patient leans: N/A  Psychiatric Specialty Exam: Physical Exam  Nursing note and vitals reviewed. Constitutional: He appears well-developed and well-nourished.  HENT:  Head: Normocephalic and atraumatic.  Eyes: Pupils are equal, round, and reactive to light. Conjunctivae are normal.  Neck: Normal range of motion.  Cardiovascular: Regular rhythm and normal heart sounds.  Respiratory: Effort normal.  GI: Soft.  Musculoskeletal: Normal range of motion.  Neurological: He is alert.  Skin: Skin is warm and dry.  Psychiatric: He has a normal mood and affect. His speech is normal and behavior is normal. Judgment normal. Cognition and memory are normal. He expresses suicidal ideation. He expresses no suicidal plans.    Review of Systems  Constitutional: Negative.   HENT: Negative.   Eyes: Negative.   Respiratory: Negative.   Cardiovascular: Negative.   Gastrointestinal: Negative.   Musculoskeletal: Negative.   Skin: Negative.   Neurological: Negative.   Psychiatric/Behavioral: Positive for depression and suicidal ideas. Negative for hallucinations, memory loss and substance abuse. The  patient is not nervous/anxious and does not have insomnia.     Blood pressure 124/79, pulse 75, temperature 98.3 F (36.8 C), temperature source Oral, resp. rate 18, height 6' 2"  (1.88 m), weight 104.3 kg (230 lb), SpO2 99 %.Body mass index is 29.53 kg/m.  General Appearance: Disheveled  Eye Contact:  Good  Speech:  Clear and Coherent  Volume:  Decreased  Mood:  Dysphoric  Affect:  Congruent  Thought Process:  Goal Directed  Orientation:  Full (Time, Place, and Person)  Thought Content:  Logical  Suicidal Thoughts:  Yes.  without intent/plan  Homicidal Thoughts:  No  Memory:  Immediate;   Fair Recent;   Fair Remote;   Fair  Judgement:  Fair  Insight:  Fair  Psychomotor Activity:  Decreased  Concentration:  Concentration: Fair  Recall:  [employer] of Knowledge:  Fair  Language:  Fair  Akathisia:  No  Handed:  Right  AIMS (if indicated):     Assets:  Desire for Improvement  ADL's:  Impaired  Cognition:  WNL  Sleep:        Treatment Plan Summary: Medication management and Plan This is a 32 year old man well-known to the psychiatry service.  The general assessment of this patient has been that despite his chronic symptoms and chronic statements of suicidality he does not benefit from inpatient psychiatric treatment but has been using the hospital as a way of avoiding making any progress towards improving his own independence or following up with outpatient treatment.  Patient may always have some risk of self injury or impulsive behavior but is not acutely dangerous and is not psychotic at this point and is showing signs of seeking out a place to stay.  I have offered to give him prescriptions for his basic medicines once again.  As always he is to follow-up with a local mental health system.  Case reviewed with ER physician and nursing.  Disposition: Patient does not meet criteria for psychiatric inpatient admission.  Alethia Berthold, MD 02/20/2018 12:24 PM

## 2018-02-20 NOTE — ED Notes (Signed)
PT IVC/ PENDING PLACEMENT  

## 2018-02-21 LAB — GLUCOSE, CAPILLARY: Glucose-Capillary: 517 mg/dL (ref 65–99)

## 2018-03-04 ENCOUNTER — Other Ambulatory Visit: Payer: Self-pay

## 2018-03-04 ENCOUNTER — Encounter: Payer: Self-pay | Admitting: Emergency Medicine

## 2018-03-04 ENCOUNTER — Emergency Department
Admission: EM | Admit: 2018-03-04 | Discharge: 2018-03-04 | Disposition: A | Discharge status: Home / Self Care | Payer: Medicaid Other | Attending: Emergency Medicine | Admitting: Emergency Medicine

## 2018-03-04 DIAGNOSIS — E11649 Type 2 diabetes mellitus with hypoglycemia without coma: Secondary | ICD-10-CM | POA: Insufficient documentation

## 2018-03-04 DIAGNOSIS — F609 Personality disorder, unspecified: Secondary | ICD-10-CM

## 2018-03-04 DIAGNOSIS — F32A Depression, unspecified: Secondary | ICD-10-CM

## 2018-03-04 DIAGNOSIS — E119 Type 2 diabetes mellitus without complications: Secondary | ICD-10-CM

## 2018-03-04 DIAGNOSIS — F314 Bipolar disorder, current episode depressed, severe, without psychotic features: Secondary | ICD-10-CM | POA: Diagnosis present

## 2018-03-04 DIAGNOSIS — F329 Major depressive disorder, single episode, unspecified: Secondary | ICD-10-CM

## 2018-03-04 DIAGNOSIS — Z79899 Other long term (current) drug therapy: Secondary | ICD-10-CM | POA: Insufficient documentation

## 2018-03-04 DIAGNOSIS — E162 Hypoglycemia, unspecified: Secondary | ICD-10-CM

## 2018-03-04 DIAGNOSIS — Z794 Long term (current) use of insulin: Secondary | ICD-10-CM | POA: Insufficient documentation

## 2018-03-04 DIAGNOSIS — F17210 Nicotine dependence, cigarettes, uncomplicated: Secondary | ICD-10-CM | POA: Insufficient documentation

## 2018-03-04 DIAGNOSIS — R4182 Altered mental status, unspecified: Secondary | ICD-10-CM

## 2018-03-04 LAB — GLUCOSE, CAPILLARY
GLUCOSE-CAPILLARY: 238 mg/dL — AB (ref 65–99)
GLUCOSE-CAPILLARY: 218 mg/dL — AB (ref 65–99)
Glucose-Capillary: 21 mg/dL — CL (ref 65–99)
Glucose-Capillary: 57 mg/dL — ABNORMAL LOW (ref 65–99)
Glucose-Capillary: 265 mg/dL — ABNORMAL HIGH (ref 65–99)
Glucose-Capillary: 345 mg/dL — ABNORMAL HIGH (ref 65–99)

## 2018-03-04 LAB — ETHANOL

## 2018-03-04 LAB — URINALYSIS, COMPLETE (UACMP) WITH MICROSCOPIC
Bacteria, UA: NONE SEEN
Bilirubin Urine: NEGATIVE
GLUCOSE, UA: 50 mg/dL — AB
HGB URINE DIPSTICK: NEGATIVE
KETONES UR: NEGATIVE mg/dL
LEUKOCYTES UA: NEGATIVE
NITRITE: NEGATIVE
PH: 6 (ref 5.0–8.0)
Protein, ur: NEGATIVE mg/dL
Specific Gravity, Urine: 1.011 (ref 1.005–1.030)
Squamous Epithelial / LPF: NONE SEEN (ref 0–5)

## 2018-03-04 LAB — CBC WITH DIFFERENTIAL/PLATELET
BASOS ABS: 0 10*3/uL (ref 0–0.1)
Basophils Relative: 0 %
EOS PCT: 1 %
Eosinophils Absolute: 0.2 10*3/uL (ref 0–0.7)
HEMATOCRIT: 41.5 % (ref 40.0–52.0)
HEMOGLOBIN: 14.1 g/dL (ref 13.0–18.0)
LYMPHS ABS: 2.7 10*3/uL (ref 1.0–3.6)
LYMPHS PCT: 16 %
MCH: 31.2 pg (ref 26.0–34.0)
MCHC: 34 g/dL (ref 32.0–36.0)
MCV: 91.9 fL (ref 80.0–100.0)
Monocytes Absolute: 1.3 10*3/uL — ABNORMAL HIGH (ref 0.2–1.0)
Monocytes Relative: 8 %
NEUTROS ABS: 13.1 10*3/uL — AB (ref 1.4–6.5)
NEUTROS PCT: 75 %
PLATELETS: 350 10*3/uL (ref 150–440)
RBC: 4.52 MIL/uL (ref 4.40–5.90)
RDW: 13.7 % (ref 11.5–14.5)
WBC: 17.3 10*3/uL — AB (ref 3.8–10.6)

## 2018-03-04 LAB — COMPREHENSIVE METABOLIC PANEL
ALK PHOS: 96 U/L (ref 38–126)
ALT: 14 U/L — AB (ref 17–63)
ANION GAP: 6 (ref 5–15)
AST: 35 U/L (ref 15–41)
Albumin: 4.7 g/dL (ref 3.5–5.0)
BUN: 7 mg/dL (ref 6–20)
CALCIUM: 9.4 mg/dL (ref 8.9–10.3)
CHLORIDE: 105 mmol/L (ref 101–111)
CO2: 28 mmol/L (ref 22–32)
CREATININE: 0.83 mg/dL (ref 0.61–1.24)
Glucose, Bld: 23 mg/dL — CL (ref 65–99)
Potassium: 3.1 mmol/L — ABNORMAL LOW (ref 3.5–5.1)
Sodium: 139 mmol/L (ref 135–145)
Total Bilirubin: 0.6 mg/dL (ref 0.3–1.2)
Total Protein: 8.4 g/dL — ABNORMAL HIGH (ref 6.5–8.1)

## 2018-03-04 LAB — URINE DRUG SCREEN, QUALITATIVE (ARMC ONLY)
AMPHETAMINES, UR SCREEN: NOT DETECTED
Barbiturates, Ur Screen: NOT DETECTED
Benzodiazepine, Ur Scrn: NOT DETECTED
CANNABINOID 50 NG, UR ~~LOC~~: POSITIVE — AB
Cocaine Metabolite,Ur ~~LOC~~: NOT DETECTED
MDMA (ECSTASY) UR SCREEN: NOT DETECTED
METHADONE SCREEN, URINE: NOT DETECTED
Opiate, Ur Screen: NOT DETECTED
Phencyclidine (PCP) Ur S: NOT DETECTED
Tricyclic, Ur Screen: NOT DETECTED

## 2018-03-04 LAB — ACETAMINOPHEN LEVEL: Acetaminophen (Tylenol), Serum: <10 ug/mL — ABNORMAL LOW (ref 10–30)

## 2018-03-04 LAB — SALICYLATE LEVEL

## 2018-03-04 MED ORDER — DEXTROSE-NACL 5-0.45 % IV SOLN
INTRAVENOUS | Status: DC
  Administered 2018-03-04: 06:00:00 via INTRAVENOUS

## 2018-03-04 MED ORDER — DEXTROSE 50 % IV SOLN
INTRAVENOUS | Status: AC
  Administered 2018-03-04: 50 mL via INTRAVENOUS
  Filled 2018-03-04: qty 50

## 2018-03-04 MED ORDER — DEXTROSE 50 % IV SOLN
1.0000 | Freq: Once | INTRAVENOUS | Status: AC
  Administered 2018-03-04: 50 mL via INTRAVENOUS

## 2018-03-04 MED ORDER — ACETAMINOPHEN 500 MG PO TABS
1000.0000 mg | ORAL_TABLET | Freq: Once | ORAL | Status: AC
  Administered 2018-03-04: 1000 mg via ORAL

## 2018-03-04 MED ORDER — ACETAMINOPHEN 500 MG PO TABS
ORAL_TABLET | ORAL | Status: AC
  Administered 2018-03-04: 1000 mg via ORAL
  Filled 2018-03-04: qty 2

## 2018-03-04 MED ORDER — LORAZEPAM 2 MG/ML IJ SOLN
0.5000 mg | Freq: Once | INTRAMUSCULAR | Status: AC
  Administered 2018-03-04: 0.5 mg via INTRAVENOUS
  Filled 2018-03-04: qty 1

## 2018-03-04 NOTE — ED Notes (Addendum)
Pt more alert.  NAD.  Will change into burgundy scrubs once no longer medical.  Will check sugar again around 1100 am and make sure is staying up.

## 2018-03-04 NOTE — ED Triage Notes (Signed)
Pt brought to Rm 25 from front door. Pt drooling and moaning. Pt well know to staff and suspect his blood sugar is low. FSBS obtained and reads 21.

## 2018-03-04 NOTE — ED Provider Notes (Addendum)
-----------------------------------------   11:05 AM on 03/04/2018 -----------------------------------------  Patient's blood glucose have been elevated now since 7 AM.  Patient is now eating.  I believe the patient is safe for medical clearance at this time awaiting psychiatric evaluation.   Minna Antis, MD 03/04/18 1106  ----------------------------------------- 11:34 AM on 03/04/2018 -----------------------------------------  Patient has been seen by psychiatry, they do not believe the patient is a threat to himself or anyone else at this time.  Patient has adequate resources and believe the patient is safe for discharge home.  Patient is medically stable we will discharge from the emergency department.    Minna Antis, MD 03/04/18 1134

## 2018-03-04 NOTE — Progress Notes (Addendum)
Inpatient Diabetes Program Recommendations  AACE/ADA: New Consensus Statement on Inpatient Glycemic Control (2015)  Target Ranges:  Prepandial:   less than 140 mg/dL      Peak postprandial:   less than 180 mg/dL (1-2 hours)      Critically ill patients:  140 - 180 mg/dL   Results for Mark [name], Mark [name] (MRN [id number]) as of 03/04/2018 10:39  Ref. Range 03/04/2018 04:24 03/04/2018 04:27 03/04/2018 05:23 03/04/2018 07:10 03/04/2018 07:49 03/04/2018 08:47  Glucose-Capillary Latest Ref Range: 65 - 99 mg/dL 21 (LL) 25 (LL) 57 (L) 238 (H) 218 (H) 265 (H)    Admit with: Hypoglycemia and Altered Mental Status/ Depression  History: DM, Depression  Home DM Meds: Levemir 19 units QHS       Novolog 0-20 units TID per SSI  Current Insulin Orders: None      Note patient told MD in the ED that he thinks he may have accidentally given himself too much insulin or given the wrong insulin last PM.  This is patient's 14th visit to the ED so far since January of this year.  [facility] and was assisted in filing for assistance at that clinic to get medications.  Continues to use ED as a source for his primary care.  Was counseled at length about the importance of glucose control by the DM Coordinator on 12/30/2017.     MD- Per nursing notes, pt ate breakfast this AM.  CBG now elevated to 265 mg/dl at 7:82NF.  If patient is to stay in the ED, please consider the following insulin orders:  1. Start Levemir 19 units QHS (start tonight)  2. Novolog Moderate Correction Scale/ SSI (0-15 units) TID AC + HS (Use Glycemic Control Order set)     --Will follow patient during hospitalization--  Ambrose Finland RN, MSN, CDE Diabetes Coordinator Inpatient Glycemic Control Team Team Pager: [phone] (8a-5p)

## 2018-03-04 NOTE — Consult Note (Signed)
[facility] Consult   Reason for Consult: Consult for 32 year old man with a history of mood and behavior problems who came into the hospital with altered mental status Referring Physician: Paduchowski Patient Identification: Mark [name] MRN:  [id number] Principal Diagnosis: Personality disorder [facility]) Diagnosis:   Patient Active Problem List   Diagnosis Date Noted  . Suicidal ideation [R45.851]   . Major depressive disorder, recurrent severe without psychotic features ([place]) [F33.2]   . Adjustment disorder with depressed mood [F43.21] 12/07/2017  . Noncompliance [Z91.19] 10/17/2017  . Dysthymia [F34.1] 10/08/2017  . Personality disorder ([place]) [F60.9] 09/26/2017  . 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 Obstructive hydrocephalus [G91.1] 09/19/2017  . Diabetes ([place]) [E11.9] 09/11/2017  . Hypoglycemia [E16.2] 09/09/2017  . MDD (major depressive disorder) [F32.9] 08/22/2017  . Severe recurrent major depression with psychotic features ([place]) [F33.3] 08/19/2017  . Nausea & vomiting [R11.2] 08/18/2017  . Abdominal pain [R10.9] 08/18/2017  . Hyponatremia [E87.1] 08/18/2017  . DKA (diabetic ketoacidoses) ([place]) [E13.10] 11/24/2015  . Diabetic keto-acidosis ([place]) [E13.10] 10/23/2015    Total Time spent with patient: 1 hour  Subjective:   Mark [name] is a 32 y.o. male patient admitted with "I do not even remember".  HPI: Patient seen chart reviewed.  32 year old man well known from multiple prior encounters.  Patient came to the emergency room with altered mental status.  Found to be hypoglycemic.  Has subsequently been revived.  Documentation says that the patient never made any suicidal statements since coming into the emergency room this time.  On interview the patient was easily arousable.  Tells me that he has been using his insulin but that sometimes he gets confused and might have taken too much.  His mood continues to be chronically down and irritable.  He denies  any suicidal plan or intent.  No evidence psychotic symptoms.  Has not followed up with any recommended mental health treatment.  Denies substance abuse.  Social history: Patient has very limited resources.  Mother's passed away sometime ago and since then he is really not been able to take care of himself but also has not followed up with applying for disability despite multiple recommendations.  Medical history: Diabetes mellitus insulin-dependent frequent episodes of DKA and abnormal blood sugars.  Also has hydrocephalus.  Substance abuse history: Does not drink or abuse drugs  Past Psychiatric History: Past history of chronic mood instability depression and mood swings.  Frequently threatens to kill himself.  He has taken excessive insulin and done other things to hurt himself in the past usually with clear intention to gain hospitalization or attention.  Patient has had psychiatric hospitalizations in the past without clear benefit.  Tends to be disruptive on the unit and has no history of following up appropriately with outpatient treatment.  Risk to Self: Suicidal Ideation: No-Not Currently/Within Last 6 Months Suicidal Intent: No-Not Currently/Within Last 6 Months Is patient at risk for suicide?: No Suicidal Plan?: No-Not Currently/Within Last 6 Months Specify Current Suicidal Plan: None current Access to Means: No What has been your use of drugs/alcohol within the last 12 months?: use of marijuana How many times?: 10(Unsure of how many times) Other Self Harm Risks: denied Triggers for Past Attempts: Unknown Intentional Self Injurious Behavior: None Risk to Others: Homicidal Ideation: No Thoughts of Harm to Others: No Current Homicidal Intent: No Current Homicidal Plan: No Access to Homicidal Means: No Identified Victim: None identified History of harm to others?: No Assessment of Violence: None Noted Does patient  have access to weapons?: Yes (Comment)(Knives) Criminal Charges  Pending?: Yes Describe Pending Criminal Charges: Marijuana Misdomeanor Does patient have a court date: Yes Court Date: 04/14/18 Prior Inpatient Therapy: Prior Inpatient Therapy: Yes Prior Therapy Dates: multiple times between 1998 to 2019 Prior Therapy Facilty/Provider(s): [facility], [facility], [facility] Reason for Treatment: Bipolar Disorder, Depression, Suicidal ideals Prior Outpatient Therapy: Prior Outpatient Therapy: No Does patient have an ACCT team?: No Does patient have Intensive In-House Services?  : No Does patient have [facility] services? : No Does patient have P4CC services?: No  Past Medical History:  Past Medical History:  Diagnosis Date  . Depression   . Diabetes mellitus without complication ([place])   . Hydrocephalus   . Kidney stones     Past Surgical History:  Procedure Laterality Date  . KIDNEY STONE SURGERY     Family History:  Family History  Problem Relation Age of Onset  . Breast cancer Mother    Family Psychiatric  History: None known Social History:  Social History   Substance and Sexual Activity  Alcohol Use Not on file   Comment: occassional      Social History   Substance and Sexual Activity  Drug Use Yes  . Types: Marijuana   Comment: smoked 2-3 days ago    Social History   Socioeconomic History  . Marital status: Single    Spouse name: Not on file  . Number of children: Not on file  . Years of education: Not on file  . Highest education level: Not on file  Occupational History  . Not on file  Social Needs  . Financial resource strain: Not on file  . Food insecurity:    Worry: Not on file    Inability: Not on file  . Transportation needs:    Medical: Not on file    Non-medical: Not on file  Tobacco Use  . Smoking status: Current Every Day Smoker    Packs/day: 0.75    Types: Cigarettes  . Smokeless tobacco: Never Used  Substance and Sexual Activity  . Alcohol use: Not on file    Comment: occassional   . Drug use: Yes    Types: Marijuana     Comment: smoked 2-3 days ago  . Sexual activity: Not on file  Lifestyle  . Physical activity:    Days per week: Not on file    Minutes per session: Not on file  . Stress: Not on file  Relationships  . Social connections:    Talks on phone: Not on file    Gets together: Not on file    Attends religious service: Not on file    Active member of club or organization: Not on file    Attends meetings of clubs or organizations: Not on file    Relationship status: Not on file  Other Topics Concern  . Not on file  Social History Narrative  . Not on file   Additional Social History:    Allergies:   Allergies  Allergen Reactions  . Mushroom Extract Complex Hives, Itching and Nausea And Vomiting  . Penicillins Anaphylaxis, Hives and Swelling    Has patient had a PCN reaction causing immediate rash, facial/tongue/throat swelling, SOB or lightheadedness with hypotension: Yes Has patient had a PCN reaction causing severe rash involving mucus membranes or skin necrosis: No Has patient had a PCN reaction that required hospitalization Yes Has patient had a PCN reaction occurring within the last 10 years: Yes If all of the above answers  are "NO", then may proceed with Cephalosporin use.  . Sulfa Antibiotics Anaphylaxis and Hives  . Clindamycin/Lincomycin Hives    Labs:  Results for orders placed or performed during the hospital encounter of 03/04/18 (from the past 48 hour(s))  Glucose, capillary     Status: Abnormal   Collection Time: 03/04/18  4:24 AM  Result Value Ref Range   Glucose-Capillary 21 (LL) 65 - 99 mg/dL   Comment 1 Notify RN    Comment 2 Document in Chart   Glucose, capillary     Status: Abnormal   Collection Time: 03/04/18  4:27 AM  Result Value Ref Range   Glucose-Capillary 25 (LL) 65 - 99 mg/dL   Comment 1 Notify RN    Comment 2 Document in Chart    Comment 3 Call MD NNP PA CNM   CBC with Differential     Status: Abnormal   Collection Time: 03/04/18  4:30 AM   Result Value Ref Range   WBC 17.3 (H) 3.8 - 10.6 K/uL   RBC 4.52 4.40 - 5.90 MIL/uL   Hemoglobin 14.1 13.0 - 18.0 g/dL   HCT 41.5 40.0 - 52.0 %   MCV 91.9 80.0 - 100.0 fL   MCH 31.2 26.0 - 34.0 pg   MCHC 34.0 32.0 - 36.0 g/dL   RDW 13.7 11.5 - 14.5 %   Platelets 350 150 - 440 K/uL   Neutrophils Relative % 75 %   Neutro Abs 13.1 (H) 1.4 - 6.5 K/uL   Lymphocytes Relative 16 %   Lymphs Abs 2.7 1.0 - 3.6 K/uL   Monocytes Relative 8 %   Monocytes Absolute 1.3 (H) 0.2 - 1.0 K/uL   Eosinophils Relative 1 %   Eosinophils Absolute 0.2 0 - 0.7 K/uL   Basophils Relative 0 %   Basophils Absolute 0.0 0 - 0.1 K/uL    Comment: Performed at [facility], [address] [place], [place] [address]  Comprehensive metabolic panel     Status: Abnormal   Collection Time: 03/04/18  4:30 AM  Result Value Ref Range   Sodium 139 135 - 145 mmol/L   Potassium 3.1 (L) 3.5 - 5.1 mmol/L   Chloride 105 101 - 111 mmol/L   CO2 28 22 - 32 mmol/L   Glucose, Bld 23 (LL) 65 - 99 mg/dL    Comment: CRITICAL RESULT CALLED TO, READ BACK BY AND VERIFIED WITH ANN CALES ON 03/04/18 AT 0454 JAG    BUN 7 6 - 20 mg/dL   Creatinine, Ser 0.83 0.61 - 1.24 mg/dL   Calcium 9.4 8.9 - 10.3 mg/dL   Total Protein 8.4 (H) 6.5 - 8.1 g/dL   Albumin 4.7 3.5 - 5.0 g/dL   AST 35 15 - 41 U/L   ALT 14 (L) 17 - 63 U/L   Alkaline Phosphatase 96 38 - 126 U/L   Total Bilirubin 0.6 0.3 - 1.2 mg/dL   GFR calc non Af Amer >60 >60 mL/min   GFR calc Af Amer >60 >60 mL/min    Comment: (NOTE) The eGFR has been calculated using the CKD EPI equation. This calculation has not been validated in all clinical situations. eGFR's persistently <60 mL/min signify possible Chronic Kidney Disease.    Anion gap 6 5 - 15    Comment: Performed at [facility], [address] [place], [place] [address]  Ethanol     Status: None   Collection Time: 03/04/18  4:30 AM  Result Value Ref Range  Alcohol, Ethyl (B) <10 <10 mg/dL     Comment:        LOWEST DETECTABLE LIMIT FOR SERUM ALCOHOL IS 10 mg/dL FOR MEDICAL PURPOSES ONLY Performed at [facility], [address] [place], [place] [address]   Acetaminophen level     Status: Abnormal   Collection Time: 03/04/18  4:30 AM  Result Value Ref Range   Acetaminophen (Tylenol), Serum <10 (L) 10 - 30 ug/mL    Comment:        THERAPEUTIC CONCENTRATIONS VARY SIGNIFICANTLY. A RANGE OF 10-30 ug/mL MAY BE AN EFFECTIVE CONCENTRATION FOR MANY PATIENTS. HOWEVER, SOME ARE BEST TREATED AT CONCENTRATIONS OUTSIDE THIS RANGE. ACETAMINOPHEN CONCENTRATIONS >150 ug/mL AT 4 HOURS AFTER INGESTION AND >50 ug/mL AT 12 HOURS AFTER INGESTION ARE OFTEN ASSOCIATED WITH TOXIC REACTIONS. Performed at [facility], [address] [place], [place] [address]   Salicylate level     Status: None   Collection Time: 03/04/18  4:30 AM  Result Value Ref Range   Salicylate Lvl <4.4 2.8 - 30.0 mg/dL    Comment: Performed at [facility], [address] [place], [place] [address]  Urine Drug Screen, Qualitative     Status: Abnormal   Collection Time: 03/04/18  4:30 AM  Result Value Ref Range   Tricyclic, Ur Screen NONE DETECTED NONE DETECTED   Amphetamines, Ur Screen NONE DETECTED NONE DETECTED   MDMA (Ecstasy)Ur Screen NONE DETECTED NONE DETECTED   Cocaine Metabolite,Ur [place] NONE DETECTED NONE DETECTED   Opiate, Ur Screen NONE DETECTED NONE DETECTED   Phencyclidine (PCP) Ur S NONE DETECTED NONE DETECTED   Cannabinoid 50 Ng, Ur [place] POSITIVE (A) NONE DETECTED   Barbiturates, Ur Screen NONE DETECTED NONE DETECTED   Benzodiazepine, Ur Scrn NONE DETECTED NONE DETECTED   Methadone Scn, Ur NONE DETECTED NONE DETECTED    Comment: (NOTE) Tricyclics + metabolites, urine    Cutoff 1000 ng/mL Amphetamines + metabolites, urine  Cutoff 1000 ng/mL MDMA (Ecstasy), urine              Cutoff 500 ng/mL Cocaine Metabolite, urine          Cutoff 300 ng/mL Opiate + metabolites,  urine        Cutoff 300 ng/mL Phencyclidine (PCP), urine         Cutoff 25 ng/mL Cannabinoid, urine                 Cutoff 50 ng/mL Barbiturates + metabolites, urine  Cutoff 200 ng/mL Benzodiazepine, urine              Cutoff 200 ng/mL Methadone, urine                   Cutoff 300 ng/mL The urine drug screen provides only a preliminary, unconfirmed analytical test result and should not be used for non-medical purposes. Clinical consideration and professional judgment should be applied to any positive drug screen result due to possible interfering substances. A more specific alternate chemical method must be used in order to obtain a confirmed analytical result. Gas chromatography / mass spectrometry (GC/MS) is the preferred confirmat ory method. Performed at [facility], [address] [place], [place] [address]   Urinalysis, Complete w Microscopic     Status: Abnormal   Collection Time: 03/04/18  4:30 AM  Result Value Ref Range   Color, Urine YELLOW (A) YELLOW   APPearance CLEAR (A) CLEAR   Specific Gravity, Urine 1.011 1.005 - 1.030  pH 6.0 5.0 - 8.0   Glucose, UA 50 (A) NEGATIVE mg/dL   Hgb urine dipstick NEGATIVE NEGATIVE   Bilirubin Urine NEGATIVE NEGATIVE   Ketones, ur NEGATIVE NEGATIVE mg/dL   Protein, ur NEGATIVE NEGATIVE mg/dL   Nitrite NEGATIVE NEGATIVE   Leukocytes, UA NEGATIVE NEGATIVE   RBC / HPF 0-5 0 - 5 RBC/hpf   WBC, UA 0-5 0 - 5 WBC/hpf   Bacteria, UA NONE SEEN NONE SEEN   Squamous Epithelial / LPF NONE SEEN 0 - 5   Mucus PRESENT    Ca Oxalate Crys, UA PRESENT     Comment: Performed at [facility], [address] [place], [place] [address]  Glucose, capillary     Status: Abnormal   Collection Time: 03/04/18  5:23 AM  Result Value Ref Range   Glucose-Capillary 57 (L) 65 - 99 mg/dL  Glucose, capillary     Status: Abnormal   Collection Time: 03/04/18  7:10 AM  Result Value Ref Range   Glucose-Capillary 238 (H) 65 - 99 mg/dL   Glucose, capillary     Status: Abnormal   Collection Time: 03/04/18  7:49 AM  Result Value Ref Range   Glucose-Capillary 218 (H) 65 - 99 mg/dL  Glucose, capillary     Status: Abnormal   Collection Time: 03/04/18  8:47 AM  Result Value Ref Range   Glucose-Capillary 265 (H) 65 - 99 mg/dL  Glucose, capillary     Status: Abnormal   Collection Time: 03/04/18 10:58 AM  Result Value Ref Range   Glucose-Capillary 345 (H) 65 - 99 mg/dL    No current facility-administered medications for this encounter.    Current Outpatient Medications  Medication Sig Dispense Refill  . blood glucose meter kit and supplies KIT Dispense based on patient and insurance preference. Use up to four times daily as directed. (FOR ICD-9 250.00, 250.01). 1 each 0  . diphenhydramine-acetaminophen (TYLENOL PM) 25-500 MG TABS tablet Take 2 tablets by mouth at bedtime as needed (for sleep).    . gabapentin (NEURONTIN) 400 MG capsule Take 3 capsules (1,200 mg total) by mouth 3 (three) times daily. 90 capsule 0  . haloperidol decanoate (HALDOL DECANOATE) 100 MG/ML injection Inject 1 mL (100 mg total) into the muscle every 28 (twenty-eight) days. (Patient not taking: Reported on 12/06/2017) 1 mL 1  . insulin aspart (NOVOLOG) 100 UNIT/ML injection Inject 0-20 Units into the skin 3 (three) times daily with meals. 10 mL 1  . insulin detemir (LEVEMIR) 100 UNIT/ML injection Inject 0.19 mLs (19 Units total) into the skin at bedtime. 10 mL 1  . Insulin Pen Needle (NOVOFINE) 30G X 8 MM MISC Inject 10 each into the skin as needed. 100 each 0  . QUEtiapine (SEROQUEL) 100 MG tablet Take 1 tablet (100 mg total) by mouth every 4 (four) hours as needed (anxiety). 60 tablet 0  . traZODone (DESYREL) 100 MG tablet Take 1 tablet (100 mg total) by mouth at bedtime as needed for sleep. 30 tablet 0  . venlafaxine XR (EFFEXOR-XR) 150 MG 24 hr capsule Take 2 capsules (300 mg total) by mouth daily with breakfast. 60 capsule 0     Musculoskeletal: Strength & Muscle Tone: within normal limits Gait & Station: normal Patient leans: N/A  Psychiatric Specialty Exam: Physical Exam  Nursing note and vitals reviewed. Constitutional: He appears well-developed and well-nourished.  HENT:  Head: Normocephalic and atraumatic.  Eyes: Pupils are equal, round, and reactive to light. Conjunctivae are normal.  Neck: Normal  range of motion.  Cardiovascular: Regular rhythm and normal heart sounds.  Respiratory: Effort normal. No respiratory distress.  GI: Soft.  Musculoskeletal: Normal range of motion.  Neurological: He is alert.  Skin: Skin is warm and dry.  Psychiatric: His affect is blunt. His speech is delayed. He is slowed. Thought content is not paranoid. Cognition and memory are impaired. He expresses impulsivity. He expresses no homicidal and no suicidal ideation.    Review of Systems  Constitutional: Negative.   HENT: Negative.   Eyes: Negative.   Respiratory: Negative.   Cardiovascular: Negative.   Gastrointestinal: Negative.   Musculoskeletal: Negative.   Skin: Negative.   Neurological: Negative.   Psychiatric/Behavioral: Positive for memory loss. Negative for depression, hallucinations, substance abuse and suicidal ideas. The patient is nervous/anxious and has insomnia.     Blood pressure (!) 131/92, pulse 89, temperature 98.5 F (36.9 C), temperature source Oral, resp. rate 20, height 6' 2"  (1.88 m), weight 104.3 kg (230 lb), SpO2 96 %.Body mass index is 29.53 kg/m.  General Appearance: Disheveled  Eye Contact:  Minimal  Speech:  Slow  Volume:  Decreased  Mood:  Dysphoric  Affect:  Congruent  Thought Process:  Goal Directed  Orientation:  Full (Time, Place, and Person)  Thought Content:  Rumination  Suicidal Thoughts:  No  Homicidal Thoughts:  No  Memory:  Immediate;   Fair Recent;   Fair Remote;   Fair  Judgement:  Impaired  Insight:  Shallow  Psychomotor Activity:  Decreased   Concentration:  Concentration: Fair  Recall:  [employer] of Knowledge:  Fair  Language:  Fair  Akathisia:  No  Handed:  Right  AIMS (if indicated):     Assets:  Resilience  ADL's:  Impaired  Cognition:  Impaired,  Mild  Sleep:        Treatment Plan Summary: Plan 32 year old man with a history of mood instability depression agitated behavior and a lot of behavior typical of dependent and borderline personality disorder who comes into the hospital with altered mental status due to his diabetes.  He has not made any suicidal threats or been agitated.  On interview the patient denies any suicidal ideation.  Patient is calm and has been cooperative.  Does not require IV C and does not require inpatient treatment.  Strongly encourage the patient to as "hates follow-up with [facility] and get to work on his disability.  Case reviewed with ER physician and TTS.  Disposition: No evidence of imminent risk to self or others at present.   Patient does not meet criteria for psychiatric inpatient admission. Supportive therapy provided about ongoing stressors. Discussed crisis plan, support from social network, calling 911, coming to the Emergency Department, and calling Suicide Hotline.  Alethia Berthold, MD 03/04/2018 3:21 PM

## 2018-03-04 NOTE — Discharge Instructions (Addendum)
Follow-up with a primary care doctor soon as possible for recheck and reevaluation.  Please check your blood glucose 4 times daily before each meal and before bedtime.  Dose your insulin as prescribed.  Please do not dose insulin if you are not going to be eating.

## 2018-03-04 NOTE — ED Notes (Addendum)
Pt is alert at this time talking in full and complete sentences with no difficulty. Denies atrying to harm himself tonight. Given [place] sandwich tray and milk.

## 2018-03-04 NOTE — ED Notes (Signed)
Report received 

## 2018-03-04 NOTE — ED Notes (Signed)
IV to right forearm removed with catheter intact.
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
## 2018-03-04 NOTE — ED Notes (Signed)
Pt drowsy but wakes to voice. Denies SI. Unlabored. NAD. Rails up X 2 for safety.  Able to answer all questions.

## 2018-03-04 NOTE — ED Notes (Signed)
Attempted to call heather patients friend for ride home as he will be discharged.  Unable to reach. Will try again

## 2018-03-04 NOTE — ED Provider Notes (Signed)
[facility] Emergency Department Provider Note   ____________________________________________   First MD Initiated Contact with Patient 03/04/18 0424     (approximate)  I have reviewed the triage vital signs and the nursing notes.   HISTORY  Chief Complaint Hypoglycemia and Altered Mental Status  Level 5 caveat: Limited by decreased mentation   HPI Mark [name] is a 32 y.o. male brought to the ED from home by friend with a chief complaint of altered mental status.  Patient is an insulin-dependent diabetic who thinks he may have accidentally either given himself too much insulin or given the wrong insulin tonight.  Denies intentional overdose.  Blood sugar on arrival was 21.  After an amp of D50 patient is now alert and talkative.  States he is depressed without suicidal ideation.  Denies HI/AH/VH.  Patient had recent behavioral medicine stay in the ED.  Would like to speak with a psychiatrist this time.  Denies recent fever, chills, chest pain, shortness of breath, abdominal pain, nausea, vomiting or diarrhea.  Denies recent travel or trauma.   Past Medical History:  Diagnosis Date  . Depression   . Diabetes mellitus without complication ([place])   . Hydrocephalus   . Kidney stones     Patient Active Problem List   Diagnosis Date Noted  . Suicidal ideation   . Major depressive disorder, recurrent severe without psychotic features ([place])   . Adjustment disorder with depressed mood 12/07/2017  . Noncompliance 10/17/2017  . Dysthymia 10/08/2017  . Personality disorder ([place]) 09/26/2017  . 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 Obstructive hydrocephalus 09/19/2017  . Diabetes ([place]) 09/11/2017  . Hypoglycemia 09/09/2017  . MDD (major depressive disorder) 08/22/2017  . Severe recurrent major depression with psychotic features ([place]) 08/19/2017  . Nausea & vomiting 08/18/2017  . Abdominal pain 08/18/2017  . Hyponatremia 08/18/2017  . DKA (diabetic ketoacidoses)  ([place]) 11/24/2015  . Diabetic keto-acidosis ([place]) 10/23/2015    Past Surgical History:  Procedure Laterality Date  . KIDNEY STONE SURGERY      Prior to Admission medications   Medication Sig Start Date End Date Taking? Authorizing Provider  blood glucose meter kit and supplies KIT Dispense based on patient and insurance preference. Use up to four times daily as directed. (FOR ICD-9 250.00, 250.01). 01/27/18   Darel Hong, MD  diphenhydramine-acetaminophen (TYLENOL PM) 25-500 MG TABS tablet Take 2 tablets by mouth at bedtime as needed (for sleep).    [provider]  gabapentin (NEURONTIN) 400 MG capsule Take 3 capsules (1,200 mg total) by mouth 3 (three) times daily. 02/20/18   Clapacs, Madie Reno, MD  haloperidol decanoate (HALDOL DECANOATE) 100 MG/ML injection Inject 1 mL (100 mg total) into the muscle every 28 (twenty-eight) days. Patient not taking: Reported on 12/06/2017 09/25/17   Pucilowska, Herma Ard B, MD  insulin aspart (NOVOLOG) 100 UNIT/ML injection Inject 0-20 Units into the skin 3 (three) times daily with meals. 02/20/18   Clapacs, Madie Reno, MD  insulin detemir (LEVEMIR) 100 UNIT/ML injection Inject 0.19 mLs (19 Units total) into the skin at bedtime. 02/20/18   Clapacs, Madie Reno, MD  Insulin Pen Needle (NOVOFINE) 30G X 8 MM MISC Inject 10 each into the skin as needed. 12/31/17   Hillary Bow, MD  QUEtiapine (SEROQUEL) 100 MG tablet Take 1 tablet (100 mg total) by mouth every 4 (four) hours as needed (anxiety). 02/20/18   Clapacs, Madie Reno, MD  traZODone (DESYREL) 100 MG tablet Take 1 tablet (100 mg total) by mouth  at bedtime as needed for sleep. 02/20/18   Clapacs, Madie Reno, MD  venlafaxine XR (EFFEXOR-XR) 150 MG 24 hr capsule Take 2 capsules (300 mg total) by mouth daily with breakfast. 02/20/18   Clapacs, Madie Reno, MD    Allergies Mushroom extract complex; Penicillins; Sulfa antibiotics; and Clindamycin/lincomycin  Family History  Problem Relation Age of Onset  . Breast cancer Mother      Social History Social History   Tobacco Use  . Smoking status: Current Every Day Smoker    Packs/day: 0.75    Types: Cigarettes  . Smokeless tobacco: Never Used  Substance Use Topics  . Alcohol use: Not on file    Comment: occassional   . Drug use: Yes    Types: Marijuana    Comment: smoked 2-3 days ago    Review of Systems  Constitutional: Positive for decreased mentation.  No fever/chills. Eyes: No visual changes. ENT: No sore throat. Cardiovascular: Denies chest pain. Respiratory: Denies shortness of breath. Gastrointestinal: No abdominal pain.  No nausea, no vomiting.  No diarrhea.  No constipation. Genitourinary: Negative for dysuria. Musculoskeletal: Negative for back pain. Skin: Negative for rash. Neurological: Negative for headaches, focal weakness or numbness. Psychiatric:Positive for depression.  ____________________________________________   PHYSICAL EXAM:  VITAL SIGNS: ED Triage Vitals  Enc Vitals Group     BP      Pulse      Resp      Temp      Temp src      SpO2      Weight      Height      Head Circumference      Peak Flow      Pain Score      Pain Loc      Pain Edu?      Excl. in [place]?     Constitutional: Alert and oriented.  Disheveled appearing and in no acute distress. Eyes: Conjunctivae are normal. PERRL. EOMI. Head: Atraumatic. Nose: No congestion/rhinnorhea. Mouth/Throat: Mucous membranes are moist.  Oropharynx non-erythematous. Neck: No stridor.   Cardiovascular: Normal rate, regular rhythm. Grossly normal heart sounds.  Good peripheral circulation. Respiratory: Normal respiratory effort.  No retractions. Lungs CTAB. Gastrointestinal: Soft and nontender. No distention. No abdominal bruits. No CVA tenderness. Musculoskeletal: No lower extremity tenderness nor edema.  No joint effusions. Neurologic:  Normal speech and language. No gross focal neurologic deficits are appreciated. No gait instability. Skin:  Skin is warm, dry  and intact. No rash noted. Psychiatric: Mood and affect are flat and tearful. Speech and behavior are normal.  ____________________________________________   LABS (all labs ordered are listed, but only abnormal results are displayed)  Labs Reviewed  GLUCOSE, CAPILLARY - Abnormal; Notable for the following components:      Result Value   Glucose-Capillary 21 (*)    All other components within normal limits  CBC WITH DIFFERENTIAL/PLATELET - Abnormal; Notable for the following components:   WBC 17.3 (*)    Neutro Abs 13.1 (*)    Monocytes Absolute 1.3 (*)    All other components within normal limits  COMPREHENSIVE METABOLIC PANEL - Abnormal; Notable for the following components:   Potassium 3.1 (*)    Glucose, Bld 23 (*)    Total Protein 8.4 (*)    ALT 14 (*)    All other components within normal limits  ACETAMINOPHEN LEVEL - Abnormal; Notable for the following components:   Acetaminophen (Tylenol), Serum <10 (*)    All other components within  normal limits  URINE DRUG SCREEN, QUALITATIVE ([facility] ONLY) - Abnormal; Notable for the following components:   Cannabinoid 50 Ng, Ur [place] POSITIVE (*)    All other components within normal limits  URINALYSIS, COMPLETE (UACMP) WITH MICROSCOPIC - Abnormal; Notable for the following components:   Color, Urine YELLOW (*)    APPearance CLEAR (*)    Glucose, UA 50 (*)    All other components within normal limits  GLUCOSE, CAPILLARY - Abnormal; Notable for the following components:   Glucose-Capillary 25 (*)    All other components within normal limits  GLUCOSE, CAPILLARY - Abnormal; Notable for the following components:   Glucose-Capillary 57 (*)    All other components within normal limits  GLUCOSE, CAPILLARY - Abnormal; Notable for the following components:   Glucose-Capillary 238 (*)    All other components within normal limits  ETHANOL  SALICYLATE LEVEL   ____________________________________________  EKG  ED ECG REPORT I, Daymen Hassebrock  J, the attending physician, personally viewed and interpreted this ECG.   Date: 03/04/2018  EKG Time: 0438  Rate: 112  Rhythm: sinus tachycardia  Axis: Normal  Intervals:none  ST&T Change: Nonspecific  ____________________________________________  RADIOLOGY  ED MD interpretation: None  Official radiology report(s): No results found.  ____________________________________________   PROCEDURES  Procedure(s) performed: None  Procedures  Critical Care performed: No  ____________________________________________   INITIAL IMPRESSION / ASSESSMENT AND PLAN / ED COURSE  As part of my medical decision making, I reviewed the following data within the [place] notes reviewed and incorporated, Labs reviewed, EKG interpreted, Old chart reviewed, A consult was requested and obtained from this/these consultant(s) Psychiatry and Notes from prior ED visits   32 year old male with insulin-dependent diabetes, major depression who presents with altered mentation secondary to hypoglycemia.  Differential diagnosis includes but is not limited to infection, metabolic, toxicological etiologies, etc.  Patient denies intentional overdose.  Denies active SI although he is tearful and depressed.  Will check screening lab work, monitor blood sugars closely, consult psychiatry to evaluate patient in the emergency department.  Patient will remain voluntary and contracts for safety.   Clinical Course as of Mar 04 713  Tue Mar 04, 2018  0529 Blood sugar 57 after D50 and meal tray.  Patient alert and mentating.  Will infuse D5 half-normal saline.  Will recheck blood sugar in 45 minutes after completion of that infusion.  If blood sugar remains low, anticipate hospitalization for persistent hypoglycemia.   [JS]  0321 IV Ativan was given for patient's tearfulness.  He has finished the D5 infusion.  Blood sugar approximately 10 minutes after completion of the infusion is 238.   Will repeat in about 45 minutes.  If patient keeps up his blood sugar, he may be medically cleared for psychiatric evaluation and disposition.  Care transferred to Dr. Kerman Passey.   [JS]    Clinical Course User Index [JS] Paulette Blanch, MD     ____________________________________________   FINAL CLINICAL IMPRESSION(S) / ED DIAGNOSES  Final diagnoses:  Hypoglycemia  Depression, unspecified depression type  Altered mental status, unspecified altered mental status type     ED Discharge Orders    None       Note:  This document was prepared using Dragon voice recognition software and may include unintentional dictation errors.    Paulette Blanch, MD 03/04/18 [id number]

## 2018-03-04 NOTE — ED Notes (Addendum)
Pt becoming more alert. Pt brought to ED by friend sho reports she heard him making a noise and she went to check on him and he was acting strange so she got him into the car and brought him here. Friends name is Neale Burly and her number is [phone]. States she will pick him up when discharged if we call her.

## 2018-03-04 NOTE — ED Notes (Signed)
Called again and heather answered. She will come to pick pt up.

## 2018-03-04 NOTE — ED Notes (Addendum)
Pt ate all breakfast. Resting comfortably. No needs at this time. Remains to deny SI/HI. Waiting for psych consult r/t depression only.

## 2018-03-05 LAB — GLUCOSE, CAPILLARY: Glucose-Capillary: 25 mg/dL — CL (ref 65–99)

## 2018-04-22 ENCOUNTER — Emergency Department
Admission: EM | Admit: 2018-04-22 | Discharge: 2018-04-23 | Disposition: A | Discharge status: Home / Self Care | Payer: Medicaid Other | Attending: Emergency Medicine | Admitting: Emergency Medicine

## 2018-04-22 ENCOUNTER — Emergency Department
Admission: EM | Admit: 2018-04-22 | Discharge: 2018-04-22 | Disposition: A | Discharge status: Home / Self Care | Payer: Medicaid Other | Attending: Emergency Medicine | Admitting: Emergency Medicine

## 2018-04-22 ENCOUNTER — Encounter: Payer: Self-pay | Admitting: *Deleted

## 2018-04-22 ENCOUNTER — Other Ambulatory Visit: Payer: Self-pay

## 2018-04-22 DIAGNOSIS — E1165 Type 2 diabetes mellitus with hyperglycemia: Secondary | ICD-10-CM | POA: Insufficient documentation

## 2018-04-22 DIAGNOSIS — F609 Personality disorder, unspecified: Secondary | ICD-10-CM | POA: Diagnosis present

## 2018-04-22 DIAGNOSIS — R739 Hyperglycemia, unspecified: Secondary | ICD-10-CM

## 2018-04-22 DIAGNOSIS — R42 Dizziness and giddiness: Secondary | ICD-10-CM | POA: Insufficient documentation

## 2018-04-22 DIAGNOSIS — R45851 Suicidal ideations: Secondary | ICD-10-CM | POA: Insufficient documentation

## 2018-04-22 DIAGNOSIS — F17210 Nicotine dependence, cigarettes, uncomplicated: Secondary | ICD-10-CM | POA: Insufficient documentation

## 2018-04-22 DIAGNOSIS — Z79899 Other long term (current) drug therapy: Secondary | ICD-10-CM | POA: Insufficient documentation

## 2018-04-22 DIAGNOSIS — Z794 Long term (current) use of insulin: Secondary | ICD-10-CM | POA: Insufficient documentation

## 2018-04-22 DIAGNOSIS — F4321 Adjustment disorder with depressed mood: Secondary | ICD-10-CM | POA: Diagnosis present

## 2018-04-22 DIAGNOSIS — E1110 Type 2 diabetes mellitus with ketoacidosis without coma: Secondary | ICD-10-CM | POA: Diagnosis present

## 2018-04-22 LAB — CBC WITH DIFFERENTIAL/PLATELET
BASOS PCT: 1 %
Basophils Absolute: 0 10*3/uL (ref 0–0.1)
EOS ABS: 0.1 10*3/uL (ref 0–0.7)
Eosinophils Relative: 1 %
HCT: 39.1 % — ABNORMAL LOW (ref 40.0–52.0)
HEMOGLOBIN: 13.3 g/dL (ref 13.0–18.0)
LYMPHS ABS: 1.4 10*3/uL (ref 1.0–3.6)
Lymphocytes Relative: 24 %
MCH: 30.9 pg (ref 26.0–34.0)
MCHC: 34 g/dL (ref 32.0–36.0)
MCV: 90.9 fL (ref 80.0–100.0)
MONOS PCT: 5 %
Monocytes Absolute: 0.3 10*3/uL (ref 0.2–1.0)
NEUTROS PCT: 69 %
Neutro Abs: 4 10*3/uL (ref 1.4–6.5)
Platelets: 216 10*3/uL (ref 150–440)
RBC: 4.3 MIL/uL — ABNORMAL LOW (ref 4.40–5.90)
RDW: 13.2 % (ref 11.5–14.5)
WBC: 5.7 10*3/uL (ref 3.8–10.6)

## 2018-04-22 LAB — URINALYSIS, COMPLETE (UACMP) WITH MICROSCOPIC
BACTERIA UA: NONE SEEN
BILIRUBIN URINE: NEGATIVE
Glucose, UA: >=500 mg/dL — AB
KETONES UR: NEGATIVE mg/dL
LEUKOCYTES UA: NEGATIVE
NITRITE: NEGATIVE
PH: 6 (ref 5.0–8.0)
Protein, ur: NEGATIVE mg/dL
SPECIFIC GRAVITY, URINE: 1.027 (ref 1.005–1.030)
Squamous Epithelial / LPF: NONE SEEN (ref 0–5)

## 2018-04-22 LAB — BASIC METABOLIC PANEL
Anion gap: 9 (ref 5–15)
BUN: 15 mg/dL (ref 6–20)
CALCIUM: 9 mg/dL (ref 8.9–10.3)
CHLORIDE: 103 mmol/L (ref 98–111)
CO2: 26 mmol/L (ref 22–32)
CREATININE: 0.97 mg/dL (ref 0.61–1.24)
GFR calc non Af Amer: >60 mL/min (ref >60)
Glucose, Bld: 402 mg/dL — ABNORMAL HIGH (ref 70–99)
Potassium: 3.9 mmol/L (ref 3.5–5.1)
SODIUM: 138 mmol/L (ref 135–145)

## 2018-04-22 LAB — GLUCOSE, CAPILLARY
GLUCOSE-CAPILLARY: 337 mg/dL — AB (ref 70–99)
Glucose-Capillary: 370 mg/dL — ABNORMAL HIGH (ref 70–99)

## 2018-04-22 MED ORDER — INSULIN DETEMIR 100 UNIT/ML ~~LOC~~ SOLN: 19.0000 [IU] | Freq: Every day | SUBCUTANEOUS | 1 refills | Status: DC

## 2018-04-22 MED ORDER — BLOOD GLUCOSE MONITOR KIT: PACK | 0 refills | Status: DC

## 2018-04-22 MED ORDER — QUETIAPINE FUMARATE 100 MG PO TABS: 100.0000 mg | ORAL_TABLET | ORAL | 0 refills | Status: DC | PRN

## 2018-04-22 MED ORDER — INSULIN ASPART 100 UNIT/ML ~~LOC~~ SOLN: 0.0000 [IU] | Freq: Three times a day (TID) | SUBCUTANEOUS | 1 refills | Status: DC

## 2018-04-22 MED ORDER — INSULIN PEN NEEDLE 30G X 8 MM MISC: 1.0000 | 0 refills | Status: DC | PRN

## 2018-04-22 MED ORDER — INSULIN ASPART 100 UNIT/ML ~~LOC~~ SOLN
8.0000 [IU] | Freq: Once | SUBCUTANEOUS | Status: AC
  Administered 2018-04-22: 8 [IU] via SUBCUTANEOUS
  Filled 2018-04-22: qty 1

## 2018-04-22 MED ORDER — VENLAFAXINE HCL ER 150 MG PO CP24: 300.0000 mg | ORAL_CAPSULE | Freq: Every day | ORAL | 0 refills | Status: DC

## 2018-04-22 MED ORDER — SODIUM CHLORIDE 0.9 % IV BOLUS: 1000.0000 mL | Freq: Once | INTRAVENOUS | Status: DC

## 2018-04-22 NOTE — ED Provider Notes (Signed)
[facility] Emergency Department Provider Note _______________   First MD Initiated Contact with Patient 04/22/18 2342     (approximate)  I have reviewed the triage vital signs and the nursing notes.   HISTORY  Chief Complaint Medication Refill    HPI Mark [name] is a 32 y.o. male below list of chronic medical conditions including diabetes mellitus and depression presents to the emergency department with suicidal ideation.  Patient was seen in the emergency department earlier today secondary to hyperglycemia.   Past Medical History:  Diagnosis Date  . Depression   . Diabetes mellitus without complication ([place])   . Hydrocephalus   . Kidney stones     Patient Active Problem List   Diagnosis Date Noted  . Suicidal ideation   . Major depressive disorder, recurrent severe without psychotic features ([place])   . Adjustment disorder with depressed mood 12/07/2017  . Noncompliance 10/17/2017  . Dysthymia 10/08/2017  . Personality disorder ([place]) 09/26/2017  . 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 Obstructive hydrocephalus 09/19/2017  . Diabetes ([place]) 09/11/2017  . Hypoglycemia 09/09/2017  . MDD (major depressive disorder) 08/22/2017  . Severe recurrent major depression with psychotic features ([place]) 08/19/2017  . Nausea & vomiting 08/18/2017  . Abdominal pain 08/18/2017  . Hyponatremia 08/18/2017  . DKA (diabetic ketoacidoses) ([place]) 11/24/2015  . Diabetic keto-acidosis ([place]) 10/23/2015    Past Surgical History:  Procedure Laterality Date  . KIDNEY STONE SURGERY      Prior to Admission medications   Medication Sig Start Date End Date Taking? Authorizing Provider  blood glucose meter kit and supplies KIT Dispense based on patient and insurance preference. Use up to four times daily as directed. (FOR ICD-9 250.00, 250.01). 04/22/18   Schaevitz, Randall An, MD  diphenhydramine-acetaminophen (TYLENOL PM) 25-500 MG TABS tablet Take 2 tablets by mouth at  bedtime as needed (for sleep).    [provider]  gabapentin (NEURONTIN) 400 MG capsule Take 3 capsules (1,200 mg total) by mouth 3 (three) times daily. 02/20/18   Clapacs, Madie Reno, MD  haloperidol decanoate (HALDOL DECANOATE) 100 MG/ML injection Inject 1 mL (100 mg total) into the muscle every 28 (twenty-eight) days. Patient not taking: Reported on 12/06/2017 09/25/17   Pucilowska, Herma Ard B, MD  insulin aspart (NOVOLOG) 100 UNIT/ML injection Inject 0-20 Units into the skin 3 (three) times daily with meals. 04/22/18   Schaevitz, Randall An, MD  insulin detemir (LEVEMIR) 100 UNIT/ML injection Inject 0.19 mLs (19 Units total) into the skin at bedtime. 04/22/18   Schaevitz, Randall An, MD  Insulin Pen Needle (NOVOFINE) 30G X 8 MM MISC Inject 10 each into the skin as needed. 04/22/18   Schaevitz, Randall An, MD  QUEtiapine (SEROQUEL) 100 MG tablet Take 1 tablet (100 mg total) by mouth every 4 (four) hours as needed (anxiety). 04/22/18   Schaevitz, Randall An, MD  traZODone (DESYREL) 100 MG tablet Take 1 tablet (100 mg total) by mouth at bedtime as needed for sleep. 02/20/18   Clapacs, Madie Reno, MD  venlafaxine XR (EFFEXOR-XR) 150 MG 24 hr capsule Take 2 capsules (300 mg total) by mouth daily with breakfast. 04/22/18   Clearnce Hasten, Randall An, MD    Allergies Mushroom extract complex; Penicillins; Sulfa antibiotics; and Clindamycin/lincomycin  Family History  Problem Relation Age of Onset  . Breast cancer Mother     Social History Social History   Tobacco Use  . Smoking status: Current Every Day Smoker    Packs/day: 0.75  Types: Cigarettes  . Smokeless tobacco: Never Used  Substance Use Topics  . Alcohol use: Never    Frequency: Never    Comment: occassional   . Drug use: Yes    Types: Marijuana    Comment: smoked 2-3 days ago    Review of Systems Constitutional: No fever/chills Eyes: No visual changes. ENT: No sore throat. Cardiovascular: Denies chest pain. Respiratory:  Denies shortness of breath. Gastrointestinal: No abdominal pain.  No nausea, no vomiting.  No diarrhea.  No constipation. Genitourinary: Negative for dysuria. Musculoskeletal: Negative for neck pain.  Negative for back pain. Integumentary: Negative for rash. Neurological: Negative for headaches, focal weakness or numbness. Psychiatric:Positive for suicidal ideation   ____________________________________________   PHYSICAL EXAM:  VITAL SIGNS: ED Triage Vitals  Enc Vitals Group     BP 04/22/18 2310 108/68     Pulse Rate 04/22/18 2310 93     Resp 04/22/18 2310 18     Temp 04/22/18 2310 98.6 F (37 C)     Temp Source 04/22/18 2310 Oral     SpO2 04/22/18 2310 99 %     Weight 04/22/18 2313 108.9 kg (240 lb)     Height 04/22/18 2313 1.88 m (6' 2")     Head Circumference --      Peak Flow --      Pain Score 04/22/18 2313 0     Pain Loc --      Pain Edu? --      Excl. in [place]? --     Constitutional: Alert and oriented. Well appearing and in no acute distress. Eyes: Conjunctivae are normal.  Head: Atraumatic. Mouth/Throat: Mucous membranes are moist. Oropharynx non-erythematous. Neck: No stridor.   Cardiovascular: Normal rate, regular rhythm. Good peripheral circulation. Grossly normal heart sounds. Respiratory: Normal respiratory effort.  No retractions. Lungs CTAB. Gastrointestinal: Soft and nontender. No distention.  Musculoskeletal: No lower extremity tenderness nor edema. No gross deformities of extremities. Neurologic:  Normal speech and language. No gross focal neurologic deficits are appreciated.  Skin:  Skin is warm, dry and intact. No rash noted. Psychiatric: Mood and affect are normal. Speech and behavior are normal.    Procedures   ____________________________________________   INITIAL IMPRESSION / ASSESSMENT AND PLAN / ED COURSE  As part of my medical decision making, I reviewed the following data within the electronic medical record:    32-year-old male  with above-stated history and physical exam presenting with suicidal ideation with previous suicide attempt via cutting his wrists.  Patient awaiting psychiatry consultation at this time. ____________________________________________  FINAL CLINICAL IMPRESSION(S) / ED DIAGNOSES  Final diagnoses:  Suicidal ideation  Hyperglycemia     MEDICATIONS GIVEN DURING THIS VISIT:  Medications  insulin aspart (novoLOG) injection 8 Units (has no administration in time range)     ED Discharge Orders    None       Note:  This document was prepared using Dragon voice recognition software and may include unintentional dictation errors.    , Maynard N, MD 04/22/18 2352  

## 2018-04-22 NOTE — ED Triage Notes (Signed)
Pt brought in via ems.  Pt was seen here earlier today.  Pt requesting a medication refill of insulin,  Pt alert.  Speech clear.

## 2018-04-22 NOTE — ED Triage Notes (Signed)
Pt arrived via [facility] with c/o Hyperglycemia. EMS states pt was picked up from [facility]. EMS states pt was let out of jail today and did not get his insulin this morning. EMS states BS was 365 and on arrival was 370.

## 2018-04-22 NOTE — ED Notes (Addendum)
Patient is being dressed out by male tech and all belongings taken and locked up because patient states he know wants to kill himself. Patient states he did not like the way EMS talked to him and he feels disrespected.

## 2018-04-22 NOTE — ED Provider Notes (Signed)
[facility] Emergency Department Provider Note ____________________________________________   First MD Initiated Contact with Patient 04/22/18 1518     (approximate)  I have reviewed the triage vital signs and the nursing notes.   HISTORY  Chief Complaint Hyperglycemia  HPI Mark [name] is a 32 y.o. male with a history of diabetes and depression was presenting to the emergency department today with lightheadedness and elevated glucose.  He says that he was just released from jail where he was given a different insulin than what he is used to.  He says that he was walking from the jail when he started feeling lightheaded.  Denies any chest pain or shortness of breath.  Says that he gets a sensation like this when his sugar runs high.  Denies any burning with urination or fever.  Denies any nausea or vomiting.  Says that he has all of his medications in [place].  Past Medical History:  Diagnosis Date  . Depression   . Diabetes mellitus without complication ([place])   . Hydrocephalus   . Kidney stones     Patient Active Problem List   Diagnosis Date Noted  . Suicidal ideation   . Major depressive disorder, recurrent severe without psychotic features ([place])   . Adjustment disorder with depressed mood 12/07/2017  . Noncompliance 10/17/2017  . Dysthymia 10/08/2017  . Personality disorder ([place]) 09/26/2017  . 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 Obstructive hydrocephalus 09/19/2017  . Diabetes ([place]) 09/11/2017  . Hypoglycemia 09/09/2017  . MDD (major depressive disorder) 08/22/2017  . Severe recurrent major depression with psychotic features ([place]) 08/19/2017  . Nausea & vomiting 08/18/2017  . Abdominal pain 08/18/2017  . Hyponatremia 08/18/2017  . DKA (diabetic ketoacidoses) ([place]) 11/24/2015  . Diabetic keto-acidosis ([place]) 10/23/2015    Past Surgical History:  Procedure Laterality Date  . KIDNEY STONE SURGERY      Prior to Admission medications     Medication Sig Start Date End Date Taking? Authorizing Provider  blood glucose meter kit and supplies KIT Dispense based on patient and insurance preference. Use up to four times daily as directed. (FOR ICD-9 250.00, 250.01). 01/27/18   Darel Hong, MD  diphenhydramine-acetaminophen (TYLENOL PM) 25-500 MG TABS tablet Take 2 tablets by mouth at bedtime as needed (for sleep).    [provider]  gabapentin (NEURONTIN) 400 MG capsule Take 3 capsules (1,200 mg total) by mouth 3 (three) times daily. 02/20/18   Clapacs, Madie Reno, MD  haloperidol decanoate (HALDOL DECANOATE) 100 MG/ML injection Inject 1 mL (100 mg total) into the muscle every 28 (twenty-eight) days. Patient not taking: Reported on 12/06/2017 09/25/17   Pucilowska, Herma Ard B, MD  insulin aspart (NOVOLOG) 100 UNIT/ML injection Inject 0-20 Units into the skin 3 (three) times daily with meals. 02/20/18   Clapacs, Madie Reno, MD  insulin detemir (LEVEMIR) 100 UNIT/ML injection Inject 0.19 mLs (19 Units total) into the skin at bedtime. 02/20/18   Clapacs, Madie Reno, MD  Insulin Pen Needle (NOVOFINE) 30G X 8 MM MISC Inject 10 each into the skin as needed. 12/31/17   Hillary Bow, MD  QUEtiapine (SEROQUEL) 100 MG tablet Take 1 tablet (100 mg total) by mouth every 4 (four) hours as needed (anxiety). 02/20/18   Clapacs, Madie Reno, MD  traZODone (DESYREL) 100 MG tablet Take 1 tablet (100 mg total) by mouth at bedtime as needed for sleep. 02/20/18   Clapacs, Madie Reno, MD  venlafaxine XR (EFFEXOR-XR) 150 MG 24 hr capsule Take 2 capsules (300  mg total) by mouth daily with breakfast. 02/20/18   Clapacs, Madie Reno, MD    Allergies Mushroom extract complex; Penicillins; Sulfa antibiotics; and Clindamycin/lincomycin  Family History  Problem Relation Age of Onset  . Breast cancer Mother     Social History Social History   Tobacco Use  . Smoking status: Current Every Day Smoker    Packs/day: 0.75    Types: Cigarettes  . Smokeless tobacco: Never Used  Substance  Use Topics  . Alcohol use: Not on file    Comment: occassional   . Drug use: Yes    Types: Marijuana    Comment: smoked 2-3 days ago    Review of Systems  Constitutional: No fever/chills Eyes: No visual changes. ENT: No sore throat. Cardiovascular: Denies chest pain. Respiratory: Denies shortness of breath. Gastrointestinal: No abdominal pain.  No nausea, no vomiting.  No diarrhea.  No constipation. Genitourinary: Negative for dysuria. Musculoskeletal: Negative for back pain. Skin: Negative for rash. Neurological: Negative for headaches, focal weakness or numbness.   ____________________________________________   PHYSICAL EXAM:  VITAL SIGNS: ED Triage Vitals  Enc Vitals Group     BP 04/22/18 1516 136/89     Pulse Rate 04/22/18 1516 88     Resp 04/22/18 1516 16     Temp 04/22/18 1516 98.1 F (36.7 C)     Temp Source 04/22/18 1516 Oral     SpO2 04/22/18 1516 99 %     Weight 04/22/18 1517 240 lb (108.9 kg)     Height 04/22/18 1517 6' 2"  (1.88 m)     Head Circumference --      Peak Flow --      Pain Score 04/22/18 1517 0     Pain Loc --      Pain Edu? --      Excl. in [place]? --     Constitutional: Alert and oriented. Well appearing and in no acute distress. Eyes: Conjunctivae are normal.  Head: Atraumatic. Nose: No congestion/rhinnorhea. Mouth/Throat: Mucous membranes are moist.  Neck: No stridor.   Cardiovascular: Normal rate, regular rhythm. Grossly normal heart sounds.  Respiratory: Normal respiratory effort.  No retractions. Lungs CTAB. Gastrointestinal: Soft and nontender. No distention.  Musculoskeletal: No lower extremity tenderness nor edema.  No joint effusions. Neurologic:  Normal speech and language. No gross focal neurologic deficits are appreciated. Skin:  Skin is warm, dry and intact. No rash noted. Psychiatric: Mood and affect are normal. Speech and behavior are normal.  ____________________________________________   LABS (all labs ordered are  listed, but only abnormal results are displayed)  Labs Reviewed  GLUCOSE, CAPILLARY - Abnormal; Notable for the following components:      Result Value   Glucose-Capillary 370 (*)    All other components within normal limits  URINALYSIS, COMPLETE (UACMP) WITH MICROSCOPIC - Abnormal; Notable for the following components:   Color, Urine YELLOW (*)    APPearance CLEAR (*)    Glucose, UA >=500 (*)    Hgb urine dipstick SMALL (*)    All other components within normal limits  CBC WITH DIFFERENTIAL/PLATELET - Abnormal; Notable for the following components:   RBC 4.30 (*)    HCT 39.1 (*)    All other components within normal limits  BASIC METABOLIC PANEL - Abnormal; Notable for the following components:   Glucose, Bld 402 (*)    All other components within normal limits  GLUCOSE, CAPILLARY - Abnormal; Notable for the following components:   Glucose-Capillary 337 (*)  All other components within normal limits   ____________________________________________  EKG   ____________________________________________  RADIOLOGY   ____________________________________________   PROCEDURES  Procedure(s) performed:   Procedures  Critical Care performed:   ____________________________________________   INITIAL IMPRESSION / ASSESSMENT AND PLAN / ED COURSE  Pertinent labs & imaging results that were available during my care of the patient were reviewed by me and considered in my medical decision making (see chart for details).  DDX: Hyperglycemia, dehydration, electrolyte abnormality, renal failure As part of my medical decision making, I reviewed the following data within the [facility] Notes from prior ED visits  ----------------------------------------- 4:30 PM on 04/22/2018 -----------------------------------------  Patient without evidence of DKA.  Appears to be improving with his glucose level with fluids.  No further complaints about any lightheadedness or  dizziness.  Patient says that he will be unable to get his medications in [place].  I told him that I would be able to print them out repeat prescriptions so that he may pick them up with either a pharmacy or medication management.  He is amenable to this plan.  He appears well.  Will be discharged at this time. ____________________________________________   FINAL CLINICAL IMPRESSION(S) / ED DIAGNOSES  Hyperglycemia.    NEW MEDICATIONS STARTED DURING THIS VISIT:  New Prescriptions   No medications on file     Note:  This document was prepared using Dragon voice recognition software and may include unintentional dictation errors.     Orbie Pyo, MD 04/22/18 1630

## 2018-04-22 NOTE — ED Notes (Signed)
Pt given RX for all of his meds and instructions to go directly across the street to the pharmacy to have them filled. Pt asked if they were still open. Told pt they should be if he goes straight over. Pt given instructions to drink plenty of water and take his meds. Pt verbalized understanding. Pt given a diet soda to go and a bus pass.

## 2018-04-22 NOTE — ED Notes (Signed)
Pt walked up to stat desk and states "I am feeling very suicidal right now". Charge nurse made aware pt taken to 19H.

## 2018-04-22 NOTE — ED Notes (Signed)
[facility]

## 2018-04-22 NOTE — ED Notes (Addendum)
Pt drinking 2l fanta soda in waiting room, pt instructed not to drink it and he stated "it does not matter now".

## 2018-04-23 DIAGNOSIS — F609 Personality disorder, unspecified: Secondary | ICD-10-CM

## 2018-04-23 LAB — GLUCOSE, CAPILLARY
Glucose-Capillary: 330 mg/dL — ABNORMAL HIGH (ref 70–99)
Glucose-Capillary: 392 mg/dL — ABNORMAL HIGH (ref 70–99)
Glucose-Capillary: 471 mg/dL — ABNORMAL HIGH (ref 70–99)
Glucose-Capillary: 488 mg/dL — ABNORMAL HIGH (ref 70–99)

## 2018-04-23 LAB — CBC WITH DIFFERENTIAL/PLATELET
BASOS PCT: 0 %
Basophils Absolute: 0 10*3/uL (ref 0–0.1)
Eosinophils Absolute: 0.2 10*3/uL (ref 0–0.7)
Eosinophils Relative: 3 %
HEMATOCRIT: 41.6 % (ref 40.0–52.0)
HEMOGLOBIN: 14.1 g/dL (ref 13.0–18.0)
Lymphocytes Relative: 19 %
Lymphs Abs: 1.6 10*3/uL (ref 1.0–3.6)
MCH: 31.5 pg (ref 26.0–34.0)
MCHC: 33.8 g/dL (ref 32.0–36.0)
MCV: 93.2 fL (ref 80.0–100.0)
Monocytes Absolute: 0.4 10*3/uL (ref 0.2–1.0)
Monocytes Relative: 4 %
NEUTROS ABS: 6.2 10*3/uL (ref 1.4–6.5)
NEUTROS PCT: 74 %
Platelets: 221 10*3/uL (ref 150–440)
RBC: 4.47 MIL/uL (ref 4.40–5.90)
RDW: 13.4 % (ref 11.5–14.5)
WBC: 8.5 10*3/uL (ref 3.8–10.6)

## 2018-04-23 LAB — BASIC METABOLIC PANEL
ANION GAP: 10 (ref 5–15)
BUN: 17 mg/dL (ref 6–20)
CALCIUM: 9.3 mg/dL (ref 8.9–10.3)
CO2: 24 mmol/L (ref 22–32)
Chloride: 97 mmol/L — ABNORMAL LOW (ref 98–111)
Creatinine, Ser: 1.05 mg/dL (ref 0.61–1.24)
Glucose, Bld: 728 mg/dL (ref 70–99)
Potassium: 5.4 mmol/L — ABNORMAL HIGH (ref 3.5–5.1)
Sodium: 131 mmol/L — ABNORMAL LOW (ref 135–145)

## 2018-04-23 LAB — URINALYSIS, COMPLETE (UACMP) WITH MICROSCOPIC
BACTERIA UA: NONE SEEN
BILIRUBIN URINE: NEGATIVE
Glucose, UA: >=500 mg/dL — AB
Hgb urine dipstick: NEGATIVE
KETONES UR: 20 mg/dL — AB
Leukocytes, UA: NEGATIVE
NITRITE: NEGATIVE
Protein, ur: NEGATIVE mg/dL
SPECIFIC GRAVITY, URINE: 1.017 (ref 1.005–1.030)
Squamous Epithelial / LPF: NONE SEEN (ref 0–5)
WBC UA: NONE SEEN WBC/hpf (ref 0–5)
pH: 6 (ref 5.0–8.0)

## 2018-04-23 MED ORDER — SODIUM CHLORIDE 0.9 % IV SOLN
Freq: Once | INTRAVENOUS | Status: AC
  Administered 2018-04-23: 15:00:00 via INTRAVENOUS

## 2018-04-23 MED ORDER — INSULIN GLARGINE 100 UNIT/ML ~~LOC~~ SOLN
45.0000 [IU] | Freq: Once | SUBCUTANEOUS | Status: AC
  Administered 2018-04-23: 45 [IU] via SUBCUTANEOUS
  Filled 2018-04-23: qty 0.45

## 2018-04-23 MED ORDER — INSULIN ASPART 100 UNIT/ML ~~LOC~~ SOLN
10.0000 [IU] | Freq: Once | SUBCUTANEOUS | Status: AC
  Administered 2018-04-23: 10 [IU] via INTRAVENOUS
  Filled 2018-04-23: qty 1

## 2018-04-23 MED ORDER — INSULIN ASPART 100 UNIT/ML ~~LOC~~ SOLN
8.0000 [IU] | Freq: Once | SUBCUTANEOUS | Status: AC
  Administered 2018-04-23: 8 [IU] via SUBCUTANEOUS
  Filled 2018-04-23: qty 1

## 2018-04-23 MED ORDER — SODIUM CHLORIDE 0.9 % IV BOLUS
2000.0000 mL | Freq: Once | INTRAVENOUS | Status: AC
  Administered 2018-04-23: 2000 mL via INTRAVENOUS

## 2018-04-23 MED ORDER — INSULIN ASPART 100 UNIT/ML ~~LOC~~ SOLN
8.0000 [IU] | Freq: Once | SUBCUTANEOUS | Status: AC
  Administered 2018-04-23: 8 [IU] via INTRAVENOUS
  Filled 2018-04-23: qty 1

## 2018-04-23 NOTE — ED Notes (Signed)
Nurse notified Dr. Claiborne Billingshat patient's blood sugar reading high, over 600 and He ordered to have Patient moved back to medical and that He was going to order labs and administer iv fluids.

## 2018-04-23 NOTE — ED Notes (Signed)
Patient received lunch tray, no signs of distress, q 15 minute checks and camera surveillance in progress for safety.

## 2018-04-23 NOTE — ED Notes (Signed)
ED BHU PLACEMENT JUSTIFICATION Is the patient under IVC or is there intent for IVC: No. Is the patient medically cleared: Yes.   Is there vacancy in the ED BHU: Yes.   Is the population mix appropriate for patient: Yes.   Is the patient awaiting placement in inpatient or outpatient setting: Yes.   Has the patient had a psychiatric consult: Yes.   Survey of unit performed for contraband, proper placement and condition of furniture, tampering with fixtures in bathroom, shower, and each patient room: Yes.   APPEARANCE/BEHAVIOR adequate rapport can be established NEURO ASSESSMENT Orientation: time, place and person Hallucinations: No.None noted (Hallucinations) Speech: Normal Gait: normal RESPIRATORY ASSESSMENT Normal expansion.  Clear to auscultation.  No rales, rhonchi, or wheezing. CARDIOVASCULAR ASSESSMENT regular rate and rhythm, S1, S2 normal, no murmur, click, rub or gallop GASTROINTESTINAL ASSESSMENT soft, nontender, BS WNL, no r/g EXTREMITIES normal strength, tone, and muscle mass PLAN OF CARE Provide calm/safe environment. Vital signs assessed twice daily. ED BHU Assessment once each 12-hour shift. Collaborate with intake RN daily or as condition indicates. Assure the ED provider has rounded once each shift. Provide and encourage hygiene. Provide redirection as needed. Assess for escalating behavior; address immediately and inform ED provider.  Assess family dynamic and appropriateness for visitation as needed: Yes.   Educate the patient/family about BHU procedures/visitation: Yes.   

## 2018-04-23 NOTE — ED Notes (Signed)
Nurse took Patient breakfast tray, but Patient is wants to sleep, will continue to monitor. q 15 minute checks and camera surveillance in progress for safety.

## 2018-04-23 NOTE — ED Notes (Signed)
Pt presents from BHU 5 for hyperglycemia. Pt is a/o NAD

## 2018-04-23 NOTE — ED Notes (Signed)
Pt stating he is wanting something to eat. Pt informed that per MD Paduchowski he wants his sugar under 300 before he can eat anything. Pt not happy at this time and is yelling cuss words as I exit the room.

## 2018-04-23 NOTE — ED Notes (Signed)
Patient up to bathroom, He is calm and cooperative, nurse gave him a diet coke, He was polite, q 15 minute checks and camera surveillance in progress for safety.

## 2018-04-23 NOTE — ED Notes (Signed)
Patient up to bathroom, no signs of distress.  

## 2018-04-23 NOTE — ED Notes (Signed)
Pt coming out of room after taking IV out yelling for his clothes. Pt advised that I would get his clothes so he could be discharged. Pt still cussing at myself and I then informed pt to not talk to staff that way and that we were getting his stuff together. Pt yelling as he went back into room. BP officer called at this time

## 2018-04-23 NOTE — ED Notes (Signed)
Pharmacy called and informed to please send up medication asap.

## 2018-04-23 NOTE — ED Notes (Signed)
BP officer at bedside

## 2018-04-23 NOTE — ED Notes (Signed)
Pt rang call bell. In to see pt. Pt requesting something to eat and drink. Told pt he could have water. Pt states "I have water, I'm starving." Explained to pt that we are trying to get his blood sugar down and the only fluid he could have at this time was water. Told pt an MD order is needed for food. Asked pt to refrain from excessive use of call bell. Pt has rung out excessively over the last hour for repeat requests and has been seen by 6 staff members. This RN walks out of room to notify primary RN. Pt begins to scream and curse where it can be heard at the nurse's station. Went back in to pt room to explain that the behavior would not be tolerated. Pt is aware of expected behavior in the hospital. Charge RN in to speak with pt.

## 2018-04-23 NOTE — Consult Note (Signed)
Psychiatry: Brief note full note to follow.  Patient is currently calm and at his baseline not agitated not acutely dangerous.  IVC discontinued.  Patient's blood sugar apparently very high and he may moved to the medical service.

## 2018-04-23 NOTE — ED Notes (Signed)
Pt given urinal.

## 2018-04-23 NOTE — ED Notes (Addendum)
Dr. Clapacs talked with patient.  

## 2018-04-23 NOTE — Progress Notes (Signed)
Inpatient Diabetes Program Recommendations  AACE/ADA: New Consensus Statement on Inpatient Glycemic Control (2015)  Target Ranges:  Prepandial:   less than 140 mg/dL      Peak postprandial:   less than 180 mg/dL (1-2 hours)      Critically ill patients:  140 - 180 mg/dL   Results for Mark [name], [name] (MRN [id number]) as of 04/23/2018 10:33  Ref. Range 04/22/2018 15:15 04/22/2018 16:27 04/23/2018 00:04 04/23/2018 07:10  Glucose-Capillary Latest Ref Range: 70 - 99 mg/dL 782370 (H) 956337 (H) 213471 (H) 330 (H)   Review of Glycemic Control  Diabetes history: DM 2 Outpatient Diabetes medications: Levemir 19 units qhs, Novolog 0-20 units tid with meals Current orders for Inpatient glycemic control: None  Inpatient Diabetes Program Recommendations:    Awaiting placement. Glucose in the 300-400 range. Patient has diabetes and takes insulin at home.  Please consider ordering Levemir 15 units, Novolog Moderate Correction 0-15 units tid with meals and Novolog HS scale 0-5 units qhs.  Thanks,  Christena DeemShannon Taylon Coole RN, MSN, BC-ADM, [facility] Inpatient Diabetes Coordinator Team Pager [phone] (8a-5p)

## 2018-04-23 NOTE — ED Notes (Signed)
Pt rings call bell for someone to empty urinal. Pt is ambulatory to the bathroom in his room. Instructed pt he could empty his urinal. Fluids completed.

## 2018-04-23 NOTE — Consult Note (Signed)
[facility] Consult   Reason for Consult: Consult for 32 year old man with a history of chronic mood and behavior problems who reporting suicidal ideation Referring Physician: Joni Fears Patient Identification: Mark [name] MRN:  [id number] Principal Diagnosis: Personality disorder [facility]) Diagnosis:   Patient Active Problem List   Diagnosis Date Noted  . Suicidal ideation [R45.851]   . Major depressive disorder, recurrent severe without psychotic features ([place]) [F33.2]   . Adjustment disorder with depressed mood [F43.21] 12/07/2017  . Noncompliance [Z91.19] 10/17/2017  . Dysthymia [F34.1] 10/08/2017  . Personality disorder ([place]) [F60.9] 09/26/2017  . 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 Obstructive hydrocephalus [G91.1] 09/19/2017  . Diabetes ([place]) [E11.9] 09/11/2017  . Hypoglycemia [E16.2] 09/09/2017  . MDD (major depressive disorder) [F32.9] 08/22/2017  . Severe recurrent major depression with psychotic features ([place]) [F33.3] 08/19/2017  . Nausea & vomiting [R11.2] 08/18/2017  . Abdominal pain [R10.9] 08/18/2017  . Hyponatremia [E87.1] 08/18/2017  . DKA (diabetic ketoacidoses) ([place]) [E13.10] 11/24/2015  . Diabetic keto-acidosis ([place]) [E13.10] 10/23/2015    Total Time spent with patient: 1 hour  Subjective:   Mark [name] is a 32 y.o. male patient admitted with "I just did not have a way to get to where I needed to go".  HPI: Patient seen chart reviewed.  32 year old man very well-known to [place] from previous encounters.  He came to the emergency room yesterday requesting a refill of his diabetes medicine.  Reportedly he was not making significant complaints about his psychiatric symptoms at first.  After his diabetes was addressed and he was discharged from the emergency room he returns stating that he was feeling "very suicidal".  Apparently he was frustrated with the way that people were speaking to him and was getting angry about that.  On interview today the  patient tells me he was frustrated that it was late in the day when he was released and had no way to get to [place].  Patient indicates that his mood is about the same as usual chronically dysphoric irritable and mildly hopeless.  Not reporting current suicidal thoughts or psychotic symptoms.  Patient indicates that he has a plan to go to [place] where supposedly he is going to get involved in some kind of residential program that he has heard about.  Social history: Major social problems.  He just got out of jail after being there for several weeks.  His mother passed away which left him with essentially no social support.  Medical history: Diabetes with very poor control multiple episodes of diabetic ketoacidosis also hydrocephalus  Substance abuse history: Concern about previous overuse of opiates.  Does not typically drink.  Past Psychiatric History: Patient has had multiple presentations to the hospital and psychiatric service because of suicidal threats.  He tends to start voicing "suicidal ideation" when he gets frustrated about something.  Patient does have a history of acting out and self injury.  Some response to psychiatric medicines but more response to just having a structured environment.  Frequent episodes of use of suicidal ideation as a manipulative way to get social needs met.  Very passive aggressive usually in his approach to self-care  Risk to Self:   Risk to Others:   Prior Inpatient Therapy:   Prior Outpatient Therapy:    Past Medical History:  Past Medical History:  Diagnosis Date  . Depression   . Diabetes mellitus without complication ([place])   . Hydrocephalus   . Kidney stones     Past Surgical History:  Procedure Laterality Date  . KIDNEY STONE SURGERY     Family History:  Family History  Problem Relation Age of Onset  . Breast cancer Mother    Family Psychiatric  History: Positive for anxiety Social History:  Social History   Substance and Sexual  Activity  Alcohol Use Never  . Frequency: Never   Comment: occassional      Social History   Substance and Sexual Activity  Drug Use Yes  . Types: Marijuana   Comment: smoked 2-3 days ago    Social History   Socioeconomic History  . Marital status: Single    Spouse name: Not on file  . Number of children: Not on file  . Years of education: Not on file  . Highest education level: Not on file  Occupational History  . Not on file  Social Needs  . Financial resource strain: Not on file  . Food insecurity:    Worry: Not on file    Inability: Not on file  . Transportation needs:    Medical: Not on file    Non-medical: Not on file  Tobacco Use  . Smoking status: Current Every Day Smoker    Packs/day: 0.75    Types: Cigarettes  . Smokeless tobacco: Never Used  Substance and Sexual Activity  . Alcohol use: Never    Frequency: Never    Comment: occassional   . Drug use: Yes    Types: Marijuana    Comment: smoked 2-3 days ago  . Sexual activity: Not on file  Lifestyle  . Physical activity:    Days per week: Not on file    Minutes per session: Not on file  . Stress: Not on file  Relationships  . Social connections:    Talks on phone: Not on file    Gets together: Not on file    Attends religious service: Not on file    Active member of club or organization: Not on file    Attends meetings of clubs or organizations: Not on file    Relationship status: Not on file  Other Topics Concern  . Not on file  Social History Narrative  . Not on file   Additional Social History:    Allergies:   Allergies  Allergen Reactions  . Mushroom Extract Complex Hives, Itching and Nausea And Vomiting  . Penicillins Anaphylaxis, Hives and Swelling    Has patient had a PCN reaction causing immediate rash, facial/tongue/throat swelling, SOB or lightheadedness with hypotension: Yes Has patient had a PCN reaction causing severe rash involving mucus membranes or skin necrosis: No Has  patient had a PCN reaction that required hospitalization Yes Has patient had a PCN reaction occurring within the last 10 years: Yes If all of the above answers are "NO", then may proceed with Cephalosporin use.  . Sulfa Antibiotics Anaphylaxis and Hives  . Clindamycin/Lincomycin Hives    Labs:  Results for orders placed or performed during the hospital encounter of 04/22/18 (from the past 48 hour(s))  Glucose, capillary     Status: Abnormal   Collection Time: 04/23/18 12:04 AM  Result Value Ref Range   Glucose-Capillary 471 (H) 70 - 99 mg/dL   Comment 1 Notify RN    Comment 2 Document in Chart   Glucose, capillary     Status: Abnormal   Collection Time: 04/23/18  7:10 AM  Result Value Ref Range   Glucose-Capillary 330 (H) 70 - 99 mg/dL  Glucose, capillary     Status:  Abnormal   Collection Time: 04/23/18  2:19 PM  Result Value Ref Range   Glucose-Capillary >600 (HH) 70 - 99 mg/dL  Basic metabolic panel     Status: Abnormal   Collection Time: 04/23/18  3:21 PM  Result Value Ref Range   Sodium 131 (L) 135 - 145 mmol/L   Potassium 5.4 (H) 3.5 - 5.1 mmol/L   Chloride 97 (L) 98 - 111 mmol/L    Comment: Please note change in reference range.   CO2 24 22 - 32 mmol/L   Glucose, Bld 728 (HH) 70 - 99 mg/dL    Comment: CRITICAL RESULT CALLED TO, READ BACK BY AND VERIFIED WITH KENDALL MOFFITT 04/23/18 1610 KLW Please note change in reference range.    BUN 17 6 - 20 mg/dL    Comment: Please note change in reference range.   Creatinine, Ser 1.05 0.61 - 1.24 mg/dL   Calcium 9.3 8.9 - 10.3 mg/dL   GFR calc non Af Amer >60 >60 mL/min   GFR calc Af Amer >60 >60 mL/min    Comment: (NOTE) The eGFR has been calculated using the CKD EPI equation. This calculation has not been validated in all clinical situations. eGFR's persistently <60 mL/min signify possible Chronic Kidney Disease.    Anion gap 10 5 - 15    Comment: Performed at [facility], [address] [place], [place]  [address]  CBC with Differential     Status: None   Collection Time: 04/23/18  3:21 PM  Result Value Ref Range   WBC 8.5 3.8 - 10.6 K/uL   RBC 4.47 4.40 - 5.90 MIL/uL   Hemoglobin 14.1 13.0 - 18.0 g/dL   HCT 41.6 40.0 - 52.0 %   MCV 93.2 80.0 - 100.0 fL   MCH 31.5 26.0 - 34.0 pg   MCHC 33.8 32.0 - 36.0 g/dL   RDW 13.4 11.5 - 14.5 %   Platelets 221 150 - 440 K/uL   Neutrophils Relative % 74 %   Neutro Abs 6.2 1.4 - 6.5 K/uL   Lymphocytes Relative 19 %   Lymphs Abs 1.6 1.0 - 3.6 K/uL   Monocytes Relative 4 %   Monocytes Absolute 0.4 0.2 - 1.0 K/uL   Eosinophils Relative 3 %   Eosinophils Absolute 0.2 0 - 0.7 K/uL   Basophils Relative 0 %   Basophils Absolute 0.0 0 - 0.1 K/uL    Comment: Performed at [facility], [address] [place], [place] [address]  Urinalysis, Complete w Microscopic     Status: Abnormal   Collection Time: 04/23/18  3:21 PM  Result Value Ref Range   Color, Urine COLORLESS (A) YELLOW   APPearance CLEAR (A) CLEAR   Specific Gravity, Urine 1.017 1.005 - 1.030   pH 6.0 5.0 - 8.0   Glucose, UA >=500 (A) NEGATIVE mg/dL   Hgb urine dipstick NEGATIVE NEGATIVE   Bilirubin Urine NEGATIVE NEGATIVE   Ketones, ur 20 (A) NEGATIVE mg/dL   Protein, ur NEGATIVE NEGATIVE mg/dL   Nitrite NEGATIVE NEGATIVE   Leukocytes, UA NEGATIVE NEGATIVE   WBC, UA NONE SEEN 0 - 5 WBC/hpf   Bacteria, UA NONE SEEN NONE SEEN   Squamous Epithelial / LPF NONE SEEN 0 - 5    Comment: Performed at [facility], [address] [place], [place] [address]  Glucose, capillary     Status: Abnormal   Collection Time: 04/23/18  4:06 PM  Result Value Ref Range   Glucose-Capillary >600 (HH)  70 - 99 mg/dL  Glucose, capillary     Status: Abnormal   Collection Time: 04/23/18  4:10 PM  Result Value Ref Range   Glucose-Capillary 594 (HH) 70 - 99 mg/dL   Comment 1 Call MD NNP PA CNM   Glucose, capillary     Status: Abnormal   Collection Time: 04/23/18  5:41 PM  Result Value Ref  Range   Glucose-Capillary 488 (H) 70 - 99 mg/dL    No current facility-administered medications for this encounter.    Current Outpatient Medications  Medication Sig Dispense Refill  . blood glucose meter kit and supplies KIT Dispense based on patient and insurance preference. Use up to four times daily as directed. (FOR ICD-9 250.00, 250.01). 1 each 0  . diphenhydramine-acetaminophen (TYLENOL PM) 25-500 MG TABS tablet Take 2 tablets by mouth at bedtime as needed (for sleep).    . gabapentin (NEURONTIN) 400 MG capsule Take 3 capsules (1,200 mg total) by mouth 3 (three) times daily. 90 capsule 0  . haloperidol decanoate (HALDOL DECANOATE) 100 MG/ML injection Inject 1 mL (100 mg total) into the muscle every 28 (twenty-eight) days. (Patient not taking: Reported on 12/06/2017) 1 mL 1  . insulin aspart (NOVOLOG) 100 UNIT/ML injection Inject 0-20 Units into the skin 3 (three) times daily with meals. 10 mL 1  . insulin detemir (LEVEMIR) 100 UNIT/ML injection Inject 0.19 mLs (19 Units total) into the skin at bedtime. 10 mL 1  . Insulin Pen Needle (NOVOFINE) 30G X 8 MM MISC Inject 10 each into the skin as needed. 100 each 0  . QUEtiapine (SEROQUEL) 100 MG tablet Take 1 tablet (100 mg total) by mouth every 4 (four) hours as needed (anxiety). 60 tablet 0  . traZODone (DESYREL) 100 MG tablet Take 1 tablet (100 mg total) by mouth at bedtime as needed for sleep. 30 tablet 0  . venlafaxine XR (EFFEXOR-XR) 150 MG 24 hr capsule Take 2 capsules (300 mg total) by mouth daily with breakfast. 60 capsule 0    Musculoskeletal: Strength & Muscle Tone: within normal limits Gait & Station: normal Patient leans: N/A  Psychiatric Specialty Exam: Physical Exam  Nursing note and vitals reviewed. Constitutional: He appears well-developed and well-nourished.  HENT:  Head: Normocephalic and atraumatic.  Eyes: Pupils are equal, round, and reactive to light. Conjunctivae are normal.  Neck: Normal range of motion.   Cardiovascular: Regular rhythm and normal heart sounds.  Respiratory: Effort normal. No respiratory distress.  GI: Soft.  Musculoskeletal: Normal range of motion.  Neurological: He is alert.  Skin: Skin is warm and dry.  Psychiatric: His affect is blunt. His speech is delayed. He is slowed. Thought content is not paranoid. Cognition and memory are normal. He expresses impulsivity. He expresses no homicidal and no suicidal ideation.    Review of Systems  Constitutional: Negative.   HENT: Negative.   Eyes: Negative.   Respiratory: Negative.   Cardiovascular: Negative.   Gastrointestinal: Negative.   Musculoskeletal: Negative.   Skin: Negative.   Neurological: Negative.   Psychiatric/Behavioral: Positive for depression. Negative for hallucinations, memory loss, substance abuse and suicidal ideas. The patient is nervous/anxious and has insomnia.     Blood pressure 119/79, pulse 69, temperature 98.6 F (37 C), temperature source Oral, resp. rate 18, height _0  (1.88 m), weight 108.9 kg (240 lb), SpO2 99 %.Body mass index is 30.81 kg/m.  General Appearance: Disheveled  Eye Contact:  Fair  Speech:  Clear and Coherent  Volume:  Normal  Mood:  Dysphoric  Affect:  Congruent  Thought Process:  Goal Directed  Orientation:  Full (Time, Place, and Person)  Thought Content:  Logical  Suicidal Thoughts:  No  Homicidal Thoughts:  No  Memory:  Immediate;   Fair Recent;   Fair Remote;   Fair  Judgement:  Impaired  Insight:  Shallow  Psychomotor Activity:  Decreased  Concentration:  Concentration: Poor  Recall:  [employer] of Knowledge:  Fair  Language:  Fair  Akathisia:  No  Handed:  Right  AIMS (if indicated):     Assets:  Desire for Improvement  ADL's:  Impaired  Cognition:  Impaired,  Mild  Sleep:        Treatment Plan Summary: Medication management and Plan 33 year old man with a history of chronic behavior problems.  Patient is now denying any suicidal ideation.  He is  lucid and not acting out with any behavior problems and is able to articulate at least some degree of a reasonable plan for the future.  Patient does not appear to be at elevated risk of dangerousness and he has a history of really not benefiting from inpatient psychiatric treatment.  Discontinued IV C.  Case reviewed with emergency room doctor and TTS.  Patient can be released and needs no medicine as he says he was given his psychiatric medicine when he left jail.  Disposition: No evidence of imminent risk to self or others at present.   Patient does not meet criteria for psychiatric inpatient admission. Supportive therapy provided about ongoing stressors. Discussed crisis plan, support from social network, calling 911, coming to the Emergency Department, and calling Suicide Hotline.  Alethia Berthold, MD 04/23/2018 6:17 PM

## 2018-04-23 NOTE — ED Notes (Signed)
Patient is transferring to room 5 hallway.

## 2018-04-23 NOTE — ED Provider Notes (Addendum)
[facility] Emergency Department Provider Note  Time seen: 3:32 PM  I have reviewed the triage vital signs and the nursing notes.   HISTORY  Chief Complaint Medication Refill    HPI Mark [name] is a 32 y.o. male with a past medical history of diabetes, poor compliance, depression, presents to the emergency department with an elevated blood glucose.  Patient was initially seen in the emergency department yesterday by Dr. Owens Shark at that time he was having suicidal ideation.  Patient was seen earlier yesterday for elevated blood glucose ultimately discharged with prescriptions but did not have anywhere to go so he returned back to the emergency department with suicidal ideation.  Was seen by psychiatry and cleared to be dis-charged home however was noted to have a blood glucose greater than 600.  Nurse notes the patient did drink a 2 L of soda while awaiting psychiatric evaluation.  Here the patient is awake alert oriented he has no complaints.  Negative review of systems.  States he has insulin but it is at his shelter where he is staying in [place] and he has no way to get there.  Denies abdominal pain, nausea vomiting or diarrhea.  No fever.  Patient states he was recently released from jail several days ago.   Past Medical History:  Diagnosis Date  . Depression   . Diabetes mellitus without complication ([place])   . Hydrocephalus   . Kidney stones     Patient Active Problem List   Diagnosis Date Noted  . Suicidal ideation   . Major depressive disorder, recurrent severe without psychotic features ([place])   . Adjustment disorder with depressed mood 12/07/2017  . Noncompliance 10/17/2017  . Dysthymia 10/08/2017  . Personality disorder ([place]) 09/26/2017  . 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 Obstructive hydrocephalus 09/19/2017  . Diabetes ([place]) 09/11/2017  . Hypoglycemia 09/09/2017  . MDD (major depressive disorder) 08/22/2017  . Severe recurrent major depression  with psychotic features ([place]) 08/19/2017  . Nausea & vomiting 08/18/2017  . Abdominal pain 08/18/2017  . Hyponatremia 08/18/2017  . DKA (diabetic ketoacidoses) ([place]) 11/24/2015  . Diabetic keto-acidosis ([place]) 10/23/2015    Past Surgical History:  Procedure Laterality Date  . KIDNEY STONE SURGERY      Prior to Admission medications   Medication Sig Start Date End Date Taking? Authorizing Provider  blood glucose meter kit and supplies KIT Dispense based on patient and insurance preference. Use up to four times daily as directed. (FOR ICD-9 250.00, 250.01). 04/22/18   Schaevitz, Randall An, MD  diphenhydramine-acetaminophen (TYLENOL PM) 25-500 MG TABS tablet Take 2 tablets by mouth at bedtime as needed (for sleep).    [provider]  gabapentin (NEURONTIN) 400 MG capsule Take 3 capsules (1,200 mg total) by mouth 3 (three) times daily. 02/20/18   Clapacs, Madie Reno, MD  haloperidol decanoate (HALDOL DECANOATE) 100 MG/ML injection Inject 1 mL (100 mg total) into the muscle every 28 (twenty-eight) days. Patient not taking: Reported on 12/06/2017 09/25/17   Pucilowska, Herma Ard B, MD  insulin aspart (NOVOLOG) 100 UNIT/ML injection Inject 0-20 Units into the skin 3 (three) times daily with meals. 04/22/18   Schaevitz, Randall An, MD  insulin detemir (LEVEMIR) 100 UNIT/ML injection Inject 0.19 mLs (19 Units total) into the skin at bedtime. 04/22/18   Schaevitz, Randall An, MD  Insulin Pen Needle (NOVOFINE) 30G X 8 MM MISC Inject 10 each into the skin as needed. 04/22/18   Orbie Pyo, MD  QUEtiapine (SEROQUEL) 100  MG tablet Take 1 tablet (100 mg total) by mouth every 4 (four) hours as needed (anxiety). 04/22/18   Schaevitz, Randall An, MD  traZODone (DESYREL) 100 MG tablet Take 1 tablet (100 mg total) by mouth at bedtime as needed for sleep. 02/20/18   Clapacs, Madie Reno, MD  venlafaxine XR (EFFEXOR-XR) 150 MG 24 hr capsule Take 2 capsules (300 mg total) by mouth daily with breakfast.  04/22/18   Clearnce Hasten, Randall An, MD    Allergies  Allergen Reactions  . Mushroom Extract Complex Hives, Itching and Nausea And Vomiting  . Penicillins Anaphylaxis, Hives and Swelling    Has patient had a PCN reaction causing immediate rash, facial/tongue/throat swelling, SOB or lightheadedness with hypotension: Yes Has patient had a PCN reaction causing severe rash involving mucus membranes or skin necrosis: No Has patient had a PCN reaction that required hospitalization Yes Has patient had a PCN reaction occurring within the last 10 years: Yes If all of the above answers are "NO", then may proceed with Cephalosporin use.  . Sulfa Antibiotics Anaphylaxis and Hives  . Clindamycin/Lincomycin Hives    Family History  Problem Relation Age of Onset  . Breast cancer Mother     Social History Social History   Tobacco Use  . Smoking status: Current Every Day Smoker    Packs/day: 0.75    Types: Cigarettes  . Smokeless tobacco: Never Used  Substance Use Topics  . Alcohol use: Never    Frequency: Never    Comment: occassional   . Drug use: Yes    Types: Marijuana    Comment: smoked 2-3 days ago    Review of Systems Constitutional: Negative for fever. Eyes: Negative for visual complaints ENT: Negative for recent illness/congestion Cardiovascular: Negative for chest pain. Respiratory: Negative for shortness of breath. Gastrointestinal: Negative for abdominal pain, vomiting and diarrhea. Genitourinary: Negative for urinary compaints Musculoskeletal: Negative for musculoskeletal complaints Skin: Negative for skin complaints  Neurological: Negative for headache All other ROS negative  ____________________________________________   PHYSICAL EXAM:  VITAL SIGNS: ED Triage Vitals  Enc Vitals Group     BP 04/22/18 2310 108/68     Pulse Rate 04/22/18 2310 93     Resp 04/22/18 2310 18     Temp 04/22/18 2310 98.6 F (37 C)     Temp Source 04/22/18 2310 Oral     SpO2  04/22/18 2310 99 %     Weight 04/22/18 2313 240 lb (108.9 kg)     Height 04/22/18 2313 _0  (1.88 m)     Head Circumference --      Peak Flow --      Pain Score 04/22/18 2313 0     Pain Loc --      Pain Edu? --      Excl. in [place]? --    Constitutional: Alert and oriented. Well appearing and in no distress. Eyes: Normal exam ENT   Head: Normocephalic and atraumatic.   Mouth/Throat: Mucous membranes are moist. Cardiovascular: Normal rate, regular rhythm. No murmur Respiratory: Normal respiratory effort without tachypnea nor retractions. Breath sounds are clear Gastrointestinal: Soft and nontender. No distention. Musculoskeletal: Nontender with normal range of motion in all extremities.  Neurologic:  Normal speech and language. No gross focal neurologic deficits Skin:  Skin is warm, dry and intact.  Psychiatric: Mood and affect are normal.  ____________________________________________   INITIAL IMPRESSION / ASSESSMENT AND PLAN / ED COURSE  Pertinent labs & imaging results that were available during my care  of the patient were reviewed by me and considered in my medical decision making (see chart for details).  Patient presents to the emergency department for an elevated blood glucose.  Patient was seen earlier this morning by Dr. Owens Shark, now being seen by myself.  Patient blood glucose is now greater than 600.  We will recheck labs, dose IV fluids, IV insulin.  Patient's blood glucose is down to 392.  Patient has become very upset, yelling and cursing loudly in the room.  I discussed with the patient that he would be able to go home.  Patient states he has no vertigo.  I discussed that we could try to arrange transport to the home a shelter for him.  Patient became very angry ripped out his IV.  Patient has prescriptions for all his medications.  I discussed with patient following up with medication management.  Patient also states he has medications in [place] and he will try to  get [place].  Again patient very upset at this time yelling cursing requiring security involvement.  Patient now requesting his nighttime Lantus dose before he leaves of 45 units.   ____________________________________________   FINAL CLINICAL IMPRESSION(S) / ED DIAGNOSES  Hyperglycemia    Harvest Dark, MD 04/23/18 1911    Harvest Dark, MD 04/23/18 [phone]

## 2018-04-24 ENCOUNTER — Emergency Department
Admission: EM | Admit: 2018-04-24 | Discharge: 2018-04-24 | Disposition: A | Discharge status: Home / Self Care | Payer: Self-pay | Attending: Emergency Medicine | Admitting: Emergency Medicine

## 2018-04-24 ENCOUNTER — Other Ambulatory Visit: Payer: Self-pay

## 2018-04-24 ENCOUNTER — Encounter: Payer: Self-pay | Admitting: *Deleted

## 2018-04-24 DIAGNOSIS — Z9114 Patient's other noncompliance with medication regimen: Secondary | ICD-10-CM | POA: Insufficient documentation

## 2018-04-24 DIAGNOSIS — E1165 Type 2 diabetes mellitus with hyperglycemia: Secondary | ICD-10-CM | POA: Insufficient documentation

## 2018-04-24 DIAGNOSIS — Z590 Homelessness: Secondary | ICD-10-CM | POA: Insufficient documentation

## 2018-04-24 DIAGNOSIS — F17210 Nicotine dependence, cigarettes, uncomplicated: Secondary | ICD-10-CM | POA: Insufficient documentation

## 2018-04-24 DIAGNOSIS — R739 Hyperglycemia, unspecified: Secondary | ICD-10-CM

## 2018-04-24 DIAGNOSIS — Z794 Long term (current) use of insulin: Secondary | ICD-10-CM | POA: Insufficient documentation

## 2018-04-24 DIAGNOSIS — Z765 Malingerer [conscious simulation]: Secondary | ICD-10-CM | POA: Insufficient documentation

## 2018-04-24 DIAGNOSIS — Z79899 Other long term (current) drug therapy: Secondary | ICD-10-CM | POA: Insufficient documentation

## 2018-04-24 LAB — CBC WITH DIFFERENTIAL/PLATELET
BASOS PCT: 1 %
Basophils Absolute: 0.1 10*3/uL (ref 0–0.1)
EOS ABS: 0.2 10*3/uL (ref 0–0.7)
Eosinophils Relative: 3 %
HEMATOCRIT: 40.5 % (ref 40.0–52.0)
Hemoglobin: 13.8 g/dL (ref 13.0–18.0)
Lymphocytes Relative: 25 %
Lymphs Abs: 2 10*3/uL (ref 1.0–3.6)
MCH: 31.5 pg (ref 26.0–34.0)
MCHC: 34.1 g/dL (ref 32.0–36.0)
MCV: 92.6 fL (ref 80.0–100.0)
MONOS PCT: 5 %
Monocytes Absolute: 0.4 10*3/uL (ref 0.2–1.0)
NEUTROS ABS: 5.3 10*3/uL (ref 1.4–6.5)
Neutrophils Relative %: 66 %
Platelets: 254 10*3/uL (ref 150–440)
RBC: 4.38 MIL/uL — ABNORMAL LOW (ref 4.40–5.90)
RDW: 13.1 % (ref 11.5–14.5)
WBC: 7.9 10*3/uL (ref 3.8–10.6)

## 2018-04-24 LAB — URINALYSIS, COMPLETE (UACMP) WITH MICROSCOPIC
Bacteria, UA: NONE SEEN
Bilirubin Urine: NEGATIVE
HGB URINE DIPSTICK: NEGATIVE
KETONES UR: NEGATIVE mg/dL
LEUKOCYTES UA: NEGATIVE
Nitrite: NEGATIVE
PROTEIN: NEGATIVE mg/dL
Specific Gravity, Urine: 1.02 (ref 1.005–1.030)
pH: 6 (ref 5.0–8.0)

## 2018-04-24 LAB — COMPREHENSIVE METABOLIC PANEL
ALBUMIN: 4.5 g/dL (ref 3.5–5.0)
ALT: 14 U/L (ref 0–44)
ANION GAP: 7 (ref 5–15)
AST: 23 U/L (ref 15–41)
Alkaline Phosphatase: 87 U/L (ref 38–126)
BILIRUBIN TOTAL: 1 mg/dL (ref 0.3–1.2)
BUN: 14 mg/dL (ref 6–20)
CO2: 25 mmol/L (ref 22–32)
Calcium: 9.4 mg/dL (ref 8.9–10.3)
Chloride: 102 mmol/L (ref 98–111)
Creatinine, Ser: 1.07 mg/dL (ref 0.61–1.24)
GFR calc Af Amer: >60 mL/min (ref >60)
GLUCOSE: 658 mg/dL — AB (ref 70–99)
Potassium: 4.3 mmol/L (ref 3.5–5.1)
Sodium: 134 mmol/L — ABNORMAL LOW (ref 135–145)
TOTAL PROTEIN: 7.8 g/dL (ref 6.5–8.1)

## 2018-04-24 LAB — GLUCOSE, CAPILLARY: GLUCOSE-CAPILLARY: 548 mg/dL — AB (ref 70–99)

## 2018-04-24 MED ORDER — INSULIN GLARGINE 100 UNIT/ML ~~LOC~~ SOLN
30.0000 [IU] | Freq: Once | SUBCUTANEOUS | Status: AC
  Administered 2018-04-24: 30 [IU] via SUBCUTANEOUS
  Filled 2018-04-24: qty 0.3

## 2018-04-24 NOTE — ED Provider Notes (Signed)
[facility] Emergency Department Provider Note  ____________________________________________   First MD Initiated Contact with Patient 04/24/18 0315     (approximate)  I have reviewed the triage vital signs and the nursing notes.   HISTORY  Chief Complaint Hyperglycemia   HPI Mark [name] is a 32 y.o. male who self presents to the emergency department requesting a place to spend the night.  He is well-known to our emergency department and to myself personally.  He has a long-standing history of insulin-dependent diabetes mellitus and has been seen in our emergency department multiple times in the last 48 hours.  Social work has been involved and we have attempted to assist him with getting low cost insulin.  He has not had his insulin in about 24 hours.  He says that he is increasingly thirsty and he would like help getting insulin.  He denies chest pain shortness of breath abdominal pain nausea or vomiting.  He denies suicidality.    Past Medical History:  Diagnosis Date  . Depression   . Diabetes mellitus without complication ([place])   . Hydrocephalus   . Kidney stones     Patient Active Problem List   Diagnosis Date Noted  . Suicidal ideation   . Major depressive disorder, recurrent severe without psychotic features ([place])   . Adjustment disorder with depressed mood 12/07/2017  . Noncompliance 10/17/2017  . Dysthymia 10/08/2017  . Personality disorder ([place]) 09/26/2017  . 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 Obstructive hydrocephalus 09/19/2017  . Diabetes ([place]) 09/11/2017  . Hypoglycemia 09/09/2017  . MDD (major depressive disorder) 08/22/2017  . Severe recurrent major depression with psychotic features ([place]) 08/19/2017  . Nausea & vomiting 08/18/2017  . Abdominal pain 08/18/2017  . Hyponatremia 08/18/2017  . DKA (diabetic ketoacidoses) ([place]) 11/24/2015  . Diabetic keto-acidosis ([place]) 10/23/2015    Past Surgical History:  Procedure Laterality  Date  . KIDNEY STONE SURGERY      Prior to Admission medications   Medication Sig Start Date End Date Taking? Authorizing Provider  blood glucose meter kit and supplies KIT Dispense based on patient and insurance preference. Use up to four times daily as directed. (FOR ICD-9 250.00, 250.01). 04/22/18   Schaevitz, Randall An, MD  diphenhydramine-acetaminophen (TYLENOL PM) 25-500 MG TABS tablet Take 2 tablets by mouth at bedtime as needed (for sleep).    [provider]  gabapentin (NEURONTIN) 400 MG capsule Take 3 capsules (1,200 mg total) by mouth 3 (three) times daily. 02/20/18   Clapacs, Madie Reno, MD  haloperidol decanoate (HALDOL DECANOATE) 100 MG/ML injection Inject 1 mL (100 mg total) into the muscle every 28 (twenty-eight) days. Patient not taking: Reported on 12/06/2017 09/25/17   Pucilowska, Herma Ard B, MD  insulin aspart (NOVOLOG) 100 UNIT/ML injection Inject 0-20 Units into the skin 3 (three) times daily with meals. 04/22/18   Schaevitz, Randall An, MD  insulin detemir (LEVEMIR) 100 UNIT/ML injection Inject 0.19 mLs (19 Units total) into the skin at bedtime. 04/22/18   Schaevitz, Randall An, MD  Insulin Pen Needle (NOVOFINE) 30G X 8 MM MISC Inject 10 each into the skin as needed. 04/22/18   Schaevitz, Randall An, MD  QUEtiapine (SEROQUEL) 100 MG tablet Take 1 tablet (100 mg total) by mouth every 4 (four) hours as needed (anxiety). 04/22/18   Schaevitz, Randall An, MD  traZODone (DESYREL) 100 MG tablet Take 1 tablet (100 mg total) by mouth at bedtime as needed for sleep. 02/20/18   Clapacs, Madie Reno, MD  venlafaxine XR (EFFEXOR-XR) 150 MG 24 hr capsule Take 2 capsules (300 mg total) by mouth daily with breakfast. 04/22/18   Clearnce Hasten, Randall An, MD    Allergies Mushroom extract complex; Penicillins; Sulfa antibiotics; and Clindamycin/lincomycin  Family History  Problem Relation Age of Onset  . Breast cancer Mother     Social History Social History   Tobacco Use  . Smoking  status: Current Every Day Smoker    Packs/day: 0.75    Types: Cigarettes  . Smokeless tobacco: Never Used  Substance Use Topics  . Alcohol use: Never    Frequency: Never    Comment: occassional   . Drug use: Yes    Types: Marijuana    Comment: smoked 2-3 days ago    Review of Systems Constitutional: No fever/chills Eyes: No visual changes. ENT: No sore throat. Cardiovascular: Denies chest pain. Respiratory: Denies shortness of breath. Gastrointestinal: No abdominal pain.  No nausea, no vomiting.  No diarrhea.  No constipation. Genitourinary: Negative for dysuria. Musculoskeletal: Negative for back pain. Skin: Negative for rash. Neurological: Negative for headaches, focal weakness or numbness.   ____________________________________________   PHYSICAL EXAM:  VITAL SIGNS: ED Triage Vitals [04/24/18 0100]  Enc Vitals Group     BP (!) 141/76     Pulse Rate (!) 105     Resp 20     Temp 99 F (37.2 C)     Temp Source Oral     SpO2 99 %     Weight 240 lb (108.9 kg)     Height '6\' 2"'$  (1.88 m)     Head Circumference      Peak Flow      Pain Score 0     Pain Loc      Pain Edu?      Excl. in [place]?     Constitutional: Somewhat flat affect nontoxic no diaphoresis Eyes: PERRL EOMI. Head: Atraumatic. Nose: No congestion/rhinnorhea. Mouth/Throat: No trismus Neck: No stridor.   Cardiovascular: Normal rate, regular rhythm. Grossly normal heart sounds.  Good peripheral circulation. Respiratory: Normal respiratory effort.  No retractions. Lungs CTAB and moving good air Gastrointestinal: Soft nontender Musculoskeletal: No lower extremity edema   Neurologic:  Normal speech and language. No gross focal neurologic deficits are appreciated. Skin:  Skin is warm, dry and intact. No rash noted. Psychiatric: Flat affect    ____________________________________________   DIFFERENTIAL includes but not limited to  Hyperglycemia, DKA, HHS,  dehydration ____________________________________________   LABS (all labs ordered are listed, but only abnormal results are displayed)  Labs Reviewed  GLUCOSE, CAPILLARY - Abnormal; Notable for the following components:      Result Value   Glucose-Capillary 548 (*)    All other components within normal limits  CBC WITH DIFFERENTIAL/PLATELET - Abnormal; Notable for the following components:   RBC 4.38 (*)    All other components within normal limits  COMPREHENSIVE METABOLIC PANEL - Abnormal; Notable for the following components:   Sodium 134 (*)    Glucose, Bld 658 (*)    All other components within normal limits  URINALYSIS, COMPLETE (UACMP) WITH MICROSCOPIC - Abnormal; Notable for the following components:   Color, Urine COLORLESS (*)    APPearance CLEAR (*)    Glucose, UA >=500 (*)    All other components within normal limits    Lab work reviewed by me with significant hyperglycemia but no signs of DKA __________________________________________  EKG   ____________________________________________  RADIOLOGY   ____________________________________________   PROCEDURES  Procedure(s) performed:  no  Procedures  Critical Care performed: no  ____________________________________________   INITIAL IMPRESSION / ASSESSMENT AND PLAN / ED COURSE  Pertinent labs & imaging results that were available during my care of the patient were reviewed by me and considered in my medical decision making (see chart for details).   The patient is quite hyperglycemic but overall very well-appearing and ambulating around the room drinking Fanta soda.  Given 30 units of Lantus to cover him for the next 24 hours and he is discharged home with his previous prescriptions.  He verbalizes understanding agreement plan.      ____________________________________________   FINAL CLINICAL IMPRESSION(S) / ED DIAGNOSES  Final diagnoses:  Hyperglycemia  Malingering      NEW MEDICATIONS  STARTED DURING THIS VISIT:  Discharge Medication List as of 04/24/2018  3:18 AM       Note:  This document was prepared using Dragon voice recognition software and may include unintentional dictation errors.     Darel Hong, MD 04/24/18 [phone]

## 2018-04-24 NOTE — ED Notes (Signed)
Pt pleasant and cooperative. States he will go get his insulin this am and had not picked it up before because this emergency room keeps discharging him to late to pick it up. He states then he is going to [place] to the mens shelter he has been staying at

## 2018-04-24 NOTE — ED Notes (Signed)
Pt unable to void at this time. 

## 2018-04-24 NOTE — Discharge Instructions (Signed)
It was a pleasure to take care of you today, and thank you for coming to our emergency department.  If you have any questions or concerns before leaving please ask the nurse to grab me and I'm more than happy to go through your aftercare instructions again.  If you were prescribed any opioid pain medication today such as Norco, Vicodin, Percocet, morphine, hydrocodone, or oxycodone please make sure you do not drive when you are taking this medication as it can alter your ability to drive safely.  If you have any concerns once you are home that you are not improving or are in fact getting worse before you can make it to your follow-up appointment, please do not hesitate to call [phone] and come back for further evaluation.  Merrily BrittleNeil Harrison Zetina, MD  Results for orders placed or performed during the hospital encounter of 04/24/18  Glucose, capillary  Result Value Ref Range   Glucose-Capillary 548 (HH) 70 - 99 mg/dL   Comment 1 Notify RN   CBC with Differential  Result Value Ref Range   WBC 7.9 3.8 - 10.6 K/uL   RBC 4.38 (L) 4.40 - 5.90 MIL/uL   Hemoglobin 13.8 13.0 - 18.0 g/dL   HCT 16.140.5 09.640.0 - 04.552.0 %   MCV 92.6 80.0 - 100.0 fL   MCH 31.5 26.0 - 34.0 pg   MCHC 34.1 32.0 - 36.0 g/dL   RDW 40.913.1 81.111.5 - 91.414.5 %   Platelets 254 150 - 440 K/uL   Neutrophils Relative % 66 %   Neutro Abs 5.3 1.4 - 6.5 K/uL   Lymphocytes Relative 25 %   Lymphs Abs 2.0 1.0 - 3.6 K/uL   Monocytes Relative 5 %   Monocytes Absolute 0.4 0.2 - 1.0 K/uL   Eosinophils Relative 3 %   Eosinophils Absolute 0.2 0 - 0.7 K/uL   Basophils Relative 1 %   Basophils Absolute 0.1 0 - 0.1 K/uL  Comprehensive metabolic panel  Result Value Ref Range   Sodium 134 (L) 135 - 145 mmol/L   Potassium 4.3 3.5 - 5.1 mmol/L   Chloride 102 98 - 111 mmol/L   CO2 25 22 - 32 mmol/L   Glucose, Bld 658 (HH) 70 - 99 mg/dL   BUN 14 6 - 20 mg/dL   Creatinine, Ser 7.821.07 0.61 - 1.24 mg/dL   Calcium 9.4 8.9 - 95.610.3 mg/dL   Total Protein 7.8 6.5 - 8.1 g/dL    Albumin 4.5 3.5 - 5.0 g/dL   AST 23 15 - 41 U/L   ALT 14 0 - 44 U/L   Alkaline Phosphatase 87 38 - 126 U/L   Total Bilirubin 1.0 0.3 - 1.2 mg/dL   GFR calc non Af Amer >60 >60 mL/min   GFR calc Af Amer >60 >60 mL/min   Anion gap 7 5 - 15  Urinalysis, Complete w Microscopic  Result Value Ref Range   Color, Urine COLORLESS (A) YELLOW   APPearance CLEAR (A) CLEAR   Specific Gravity, Urine 1.020 1.005 - 1.030   pH 6.0 5.0 - 8.0   Glucose, UA >=500 (A) NEGATIVE mg/dL   Hgb urine dipstick NEGATIVE NEGATIVE   Bilirubin Urine NEGATIVE NEGATIVE   Ketones, ur NEGATIVE NEGATIVE mg/dL   Protein, ur NEGATIVE NEGATIVE mg/dL   Nitrite NEGATIVE NEGATIVE   Leukocytes, UA NEGATIVE NEGATIVE   RBC / HPF 0-5 0 - 5 RBC/hpf   WBC, UA 0-5 0 - 5 WBC/hpf   Bacteria, UA NONE SEEN NONE SEEN  Squamous Epithelial / LPF 0-5 0 - 5

## 2018-04-24 NOTE — ED Triage Notes (Signed)
Pt ambulatory to triage.  Pt states his blood sugar is high tonight.  No n/v  Pt alert.

## 2018-04-25 LAB — GLUCOSE, CAPILLARY: GLUCOSE-CAPILLARY: 594 mg/dL — AB (ref 70–99)

## 2018-04-25 MED ORDER — NICOTINE 21 MG/24HR TD PT24
1.00 | MEDICATED_PATCH | TRANSDERMAL | Status: DC
Start: 2018-04-26 — End: 2018-04-25

## 2018-04-25 MED ORDER — INSULIN LISPRO 100 UNIT/ML ~~LOC~~ SOLN
.00 | SUBCUTANEOUS | Status: DC
Start: 2018-04-25 — End: 2018-04-25

## 2018-04-25 MED ORDER — LORAZEPAM 2 MG/ML IJ SOLN
1.00 | INTRAMUSCULAR | Status: DC
Start: ? — End: 2018-04-25

## 2018-04-25 MED ORDER — DEXTROSE 50 % IV SOLN
25.00 | INTRAVENOUS | Status: DC
Start: ? — End: 2018-04-25

## 2018-04-25 MED ORDER — GENERIC EXTERNAL MEDICATION
2.00 | Status: DC
Start: ? — End: 2018-04-25

## 2018-04-25 MED ORDER — ENOXAPARIN SODIUM 40 MG/0.4ML ~~LOC~~ SOLN
40.00 | SUBCUTANEOUS | Status: DC
Start: 2018-04-26 — End: 2018-04-25

## 2018-04-25 MED ORDER — LORAZEPAM 1 MG PO TABS
1.00 | ORAL_TABLET | ORAL | Status: DC
Start: ? — End: 2018-04-25

## 2018-04-25 MED ORDER — ONDANSETRON HCL 4 MG/2ML IJ SOLN
4.00 | INTRAMUSCULAR | Status: DC
Start: ? — End: 2018-04-25

## 2018-04-25 MED ORDER — HALOPERIDOL 5 MG PO TABS
2.50 | ORAL_TABLET | ORAL | Status: DC
Start: ? — End: 2018-04-25

## 2018-04-25 MED ORDER — TRAZODONE HCL 50 MG PO TABS
100.00 | ORAL_TABLET | ORAL | Status: DC
Start: ? — End: 2018-04-25

## 2018-04-25 MED ORDER — ACETAMINOPHEN 325 MG PO TABS
650.00 | ORAL_TABLET | ORAL | Status: DC
Start: ? — End: 2018-04-25

## 2018-05-16 ENCOUNTER — Telehealth: Payer: Self-pay | Admitting: Pharmacist

## 2018-05-16 NOTE — Telephone Encounter (Signed)
05/16/2018 10:06:42 AM - Lantus Solostar refill  05/16/18 Faxed [employer] refill request for Lantus Solostar pen Inject 35 units into the skin daily at bedtime #2.Forde RadonAJ

## 2018-05-31 ENCOUNTER — Inpatient Hospital Stay
Admission: EM | Admit: 2018-05-31 | Discharge: 2018-06-04 | DRG: 918 | Disposition: A | Discharge status: Home / Self Care | Payer: Self-pay | Attending: Internal Medicine | Admitting: Internal Medicine

## 2018-05-31 ENCOUNTER — Encounter: Payer: Self-pay | Admitting: Emergency Medicine

## 2018-05-31 ENCOUNTER — Other Ambulatory Visit: Payer: Self-pay

## 2018-05-31 DIAGNOSIS — T383X2S Poisoning by insulin and oral hypoglycemic [antidiabetic] drugs, intentional self-harm, sequela: Secondary | ICD-10-CM

## 2018-05-31 DIAGNOSIS — Z79890 Hormone replacement therapy (postmenopausal): Secondary | ICD-10-CM

## 2018-05-31 DIAGNOSIS — R45851 Suicidal ideations: Secondary | ICD-10-CM

## 2018-05-31 DIAGNOSIS — F314 Bipolar disorder, current episode depressed, severe, without psychotic features: Secondary | ICD-10-CM | POA: Diagnosis present

## 2018-05-31 DIAGNOSIS — Z818 Family history of other mental and behavioral disorders: Secondary | ICD-10-CM

## 2018-05-31 DIAGNOSIS — Z79899 Other long term (current) drug therapy: Secondary | ICD-10-CM

## 2018-05-31 DIAGNOSIS — F17210 Nicotine dependence, cigarettes, uncomplicated: Secondary | ICD-10-CM | POA: Diagnosis present

## 2018-05-31 DIAGNOSIS — F332 Major depressive disorder, recurrent severe without psychotic features: Secondary | ICD-10-CM | POA: Diagnosis present

## 2018-05-31 DIAGNOSIS — Z881 Allergy status to other antibiotic agents status: Secondary | ICD-10-CM

## 2018-05-31 DIAGNOSIS — T383X2A Poisoning by insulin and oral hypoglycemic [antidiabetic] drugs, intentional self-harm, initial encounter: Principal | ICD-10-CM | POA: Diagnosis present

## 2018-05-31 DIAGNOSIS — E876 Hypokalemia: Secondary | ICD-10-CM | POA: Diagnosis present

## 2018-05-31 DIAGNOSIS — F419 Anxiety disorder, unspecified: Secondary | ICD-10-CM | POA: Diagnosis present

## 2018-05-31 DIAGNOSIS — Z915 Personal history of self-harm: Secondary | ICD-10-CM

## 2018-05-31 DIAGNOSIS — Z87442 Personal history of urinary calculi: Secondary | ICD-10-CM

## 2018-05-31 DIAGNOSIS — Z880 Allergy status to penicillin: Secondary | ICD-10-CM

## 2018-05-31 DIAGNOSIS — Z882 Allergy status to sulfonamides status: Secondary | ICD-10-CM

## 2018-05-31 DIAGNOSIS — Z590 Homelessness: Secondary | ICD-10-CM

## 2018-05-31 DIAGNOSIS — Z794 Long term (current) use of insulin: Secondary | ICD-10-CM

## 2018-05-31 DIAGNOSIS — E1140 Type 2 diabetes mellitus with diabetic neuropathy, unspecified: Secondary | ICD-10-CM | POA: Diagnosis present

## 2018-05-31 DIAGNOSIS — Z9119 Patient's noncompliance with other medical treatment and regimen: Secondary | ICD-10-CM

## 2018-05-31 DIAGNOSIS — F6089 Other specific personality disorders: Secondary | ICD-10-CM | POA: Diagnosis present

## 2018-05-31 DIAGNOSIS — E162 Hypoglycemia, unspecified: Secondary | ICD-10-CM | POA: Diagnosis present

## 2018-05-31 DIAGNOSIS — E10649 Type 1 diabetes mellitus with hypoglycemia without coma: Secondary | ICD-10-CM | POA: Diagnosis present

## 2018-05-31 DIAGNOSIS — E1040 Type 1 diabetes mellitus with diabetic neuropathy, unspecified: Secondary | ICD-10-CM | POA: Diagnosis present

## 2018-05-31 DIAGNOSIS — F609 Personality disorder, unspecified: Secondary | ICD-10-CM

## 2018-05-31 LAB — GLUCOSE, CAPILLARY
GLUCOSE-CAPILLARY: 55 mg/dL — AB (ref 70–99)
GLUCOSE-CAPILLARY: 86 mg/dL (ref 70–99)
GLUCOSE-CAPILLARY: 44 mg/dL — AB (ref 70–99)
GLUCOSE-CAPILLARY: 58 mg/dL — AB (ref 70–99)
GLUCOSE-CAPILLARY: 94 mg/dL (ref 70–99)
GLUCOSE-CAPILLARY: 66 mg/dL — AB (ref 70–99)
GLUCOSE-CAPILLARY: 165 mg/dL — AB (ref 70–99)
GLUCOSE-CAPILLARY: 232 mg/dL — AB (ref 70–99)
Glucose-Capillary: 32 mg/dL — CL (ref 70–99)
Glucose-Capillary: 104 mg/dL — ABNORMAL HIGH (ref 70–99)
Glucose-Capillary: 47 mg/dL — ABNORMAL LOW (ref 70–99)
Glucose-Capillary: 50 mg/dL — ABNORMAL LOW (ref 70–99)
Glucose-Capillary: 58 mg/dL — ABNORMAL LOW (ref 70–99)
Glucose-Capillary: 55 mg/dL — ABNORMAL LOW (ref 70–99)
Glucose-Capillary: 61 mg/dL — ABNORMAL LOW (ref 70–99)
Glucose-Capillary: 70 mg/dL (ref 70–99)
Glucose-Capillary: 88 mg/dL (ref 70–99)
Glucose-Capillary: 265 mg/dL — ABNORMAL HIGH (ref 70–99)

## 2018-05-31 LAB — ETHANOL: Alcohol, Ethyl (B): 12 mg/dL — ABNORMAL HIGH (ref <10)

## 2018-05-31 LAB — URINE DRUG SCREEN, QUALITATIVE (ARMC ONLY)
Amphetamines, Ur Screen: NOT DETECTED
BARBITURATES, UR SCREEN: NOT DETECTED
Cannabinoid 50 Ng, Ur ~~LOC~~: NOT DETECTED
Cocaine Metabolite,Ur ~~LOC~~: NOT DETECTED
MDMA (Ecstasy)Ur Screen: NOT DETECTED
METHADONE SCREEN, URINE: NOT DETECTED
OPIATE, UR SCREEN: NOT DETECTED
Phencyclidine (PCP) Ur S: NOT DETECTED
Tricyclic, Ur Screen: NOT DETECTED

## 2018-05-31 LAB — COMPREHENSIVE METABOLIC PANEL
ALBUMIN: 4.3 g/dL (ref 3.5–5.0)
ALK PHOS: 94 U/L (ref 38–126)
ALT: 12 U/L (ref 0–44)
ANION GAP: 8 (ref 5–15)
AST: 26 U/L (ref 15–41)
BUN: 13 mg/dL (ref 6–20)
CALCIUM: 9.4 mg/dL (ref 8.9–10.3)
CHLORIDE: 107 mmol/L (ref 98–111)
CO2: 26 mmol/L (ref 22–32)
CREATININE: 1.11 mg/dL (ref 0.61–1.24)
GFR calc Af Amer: >60 mL/min (ref >60)
GFR calc non Af Amer: >60 mL/min (ref >60)
GLUCOSE: 40 mg/dL — AB (ref 70–99)
Potassium: 2.8 mmol/L — ABNORMAL LOW (ref 3.5–5.1)
SODIUM: 141 mmol/L (ref 135–145)
Total Bilirubin: 0.8 mg/dL (ref 0.3–1.2)
Total Protein: 7.6 g/dL (ref 6.5–8.1)

## 2018-05-31 LAB — CBC
HEMATOCRIT: 42.4 % (ref 40.0–52.0)
HEMOGLOBIN: 14.8 g/dL (ref 13.0–18.0)
MCH: 30.2 pg (ref 26.0–34.0)
MCHC: 34.8 g/dL (ref 32.0–36.0)
MCV: 86.7 fL (ref 80.0–100.0)
Platelets: 319 10*3/uL (ref 150–440)
RBC: 4.89 MIL/uL (ref 4.40–5.90)
RDW: 13.2 % (ref 11.5–14.5)
WBC: 9.9 10*3/uL (ref 3.8–10.6)

## 2018-05-31 LAB — SALICYLATE LEVEL: Salicylate Lvl: <7 mg/dL (ref 2.8–30.0)

## 2018-05-31 LAB — MAGNESIUM: Magnesium: 1.9 mg/dL (ref 1.7–2.4)

## 2018-05-31 LAB — ACETAMINOPHEN LEVEL

## 2018-05-31 LAB — MRSA PCR SCREENING: MRSA by PCR: NEGATIVE

## 2018-05-31 MED ORDER — POTASSIUM CHLORIDE CRYS ER 20 MEQ PO TBCR
20.0000 meq | EXTENDED_RELEASE_TABLET | Freq: Two times a day (BID) | ORAL | Status: AC
  Administered 2018-05-31 – 2018-06-01 (×4): 20 meq via ORAL
  Filled 2018-05-31 (×4): qty 1

## 2018-05-31 MED ORDER — INSULIN GLARGINE 100 UNIT/ML ~~LOC~~ SOLN
36.00 | SUBCUTANEOUS | Status: DC
Start: 2018-05-30 — End: 2018-05-31

## 2018-05-31 MED ORDER — HALOPERIDOL 5 MG PO TABS
5.00 | ORAL_TABLET | ORAL | Status: DC
Start: ? — End: 2018-05-31

## 2018-05-31 MED ORDER — DEXTROSE 5 % IV SOLN
Freq: Once | INTRAVENOUS | Status: AC
  Administered 2018-05-31: 10:00:00 via INTRAVENOUS

## 2018-05-31 MED ORDER — NICOTINE POLACRILEX 2 MG MT GUM
2.00 | CHEWING_GUM | OROMUCOSAL | Status: DC
Start: ? — End: 2018-05-31

## 2018-05-31 MED ORDER — DEXTROSE 50 % IV SOLN
25.00 | INTRAVENOUS | Status: DC
Start: ? — End: 2018-05-31

## 2018-05-31 MED ORDER — GABAPENTIN 300 MG PO CAPS
600.0000 mg | ORAL_CAPSULE | Freq: Three times a day (TID) | ORAL | Status: DC
  Administered 2018-05-31 – 2018-06-03 (×10): 600 mg via ORAL
  Filled 2018-05-31 (×10): qty 2

## 2018-05-31 MED ORDER — INSULIN LISPRO 100 UNIT/ML ~~LOC~~ SOLN
.00 | SUBCUTANEOUS | Status: DC
Start: 2018-05-30 — End: 2018-05-31

## 2018-05-31 MED ORDER — DEXTROSE 50 % IV SOLN
INTRAVENOUS | Status: AC
  Filled 2018-05-31: qty 50

## 2018-05-31 MED ORDER — QUETIAPINE FUMARATE 100 MG PO TABS
300.00 | ORAL_TABLET | ORAL | Status: DC
Start: 2018-05-30 — End: 2018-05-31

## 2018-05-31 MED ORDER — ONDANSETRON HCL 4 MG/2ML IJ SOLN: 4.0000 mg | Freq: Four times a day (QID) | INTRAMUSCULAR | Status: DC | PRN

## 2018-05-31 MED ORDER — ONDANSETRON HCL 4 MG PO TABS: 4.0000 mg | ORAL_TABLET | Freq: Four times a day (QID) | ORAL | Status: DC | PRN

## 2018-05-31 MED ORDER — DIPHENHYDRAMINE HCL 50 MG PO CAPS
50.00 | ORAL_CAPSULE | ORAL | Status: DC
Start: ? — End: 2018-05-31

## 2018-05-31 MED ORDER — HYDROXYZINE HCL 25 MG PO TABS: 50.0000 mg | ORAL_TABLET | Freq: Four times a day (QID) | ORAL | Status: DC | PRN

## 2018-05-31 MED ORDER — GABAPENTIN 300 MG PO CAPS: 300.0000 mg | ORAL_CAPSULE | Freq: Three times a day (TID) | ORAL | Status: DC

## 2018-05-31 MED ORDER — DIPHENHYDRAMINE HCL 50 MG/ML IJ SOLN
50.00 | INTRAMUSCULAR | Status: DC
Start: ? — End: 2018-05-31

## 2018-05-31 MED ORDER — POLYETHYLENE GLYCOL 3350 17 G PO PACK
17.00 | PACK | ORAL | Status: DC
Start: 2018-05-31 — End: 2018-05-31

## 2018-05-31 MED ORDER — ENOXAPARIN SODIUM 40 MG/0.4ML ~~LOC~~ SOLN
40.0000 mg | SUBCUTANEOUS | Status: DC
  Administered 2018-05-31 – 2018-06-03 (×4): 40 mg via SUBCUTANEOUS
  Filled 2018-05-31 (×4): qty 0.4

## 2018-05-31 MED ORDER — INSULIN LISPRO 100 UNIT/ML ~~LOC~~ SOLN
.00 | SUBCUTANEOUS | Status: DC
Start: ? — End: 2018-05-31

## 2018-05-31 MED ORDER — NICOTINE 21 MG/24HR TD PT24
1.00 | MEDICATED_PATCH | TRANSDERMAL | Status: DC
Start: 2018-05-31 — End: 2018-05-31

## 2018-05-31 MED ORDER — BUPROPION HCL ER (SR) 150 MG PO TB12
150.0000 mg | ORAL_TABLET | Freq: Every day | ORAL | Status: DC
  Administered 2018-05-31: 150 mg via ORAL
  Filled 2018-05-31: qty 1

## 2018-05-31 MED ORDER — ACETAMINOPHEN 325 MG PO TABS: 650.0000 mg | ORAL_TABLET | Freq: Four times a day (QID) | ORAL | Status: DC | PRN

## 2018-05-31 MED ORDER — HYDROXYZINE PAMOATE 25 MG PO CAPS
50.00 | ORAL_CAPSULE | ORAL | Status: DC
Start: ? — End: 2018-05-31

## 2018-05-31 MED ORDER — SENNOSIDES 8.6 MG PO TABS
2.00 | ORAL_TABLET | ORAL | Status: DC
Start: 2018-05-30 — End: 2018-05-31

## 2018-05-31 MED ORDER — PREGABALIN 75 MG PO CAPS
150.00 | ORAL_CAPSULE | ORAL | Status: DC
Start: 2018-05-30 — End: 2018-05-31

## 2018-05-31 MED ORDER — DEXTROSE 50 % IV SOLN
INTRAVENOUS | Status: AC
  Administered 2018-05-31: 50 mL via INTRAVENOUS
  Filled 2018-05-31: qty 50

## 2018-05-31 MED ORDER — LEVOTHYROXINE SODIUM 50 MCG PO TABS
100.00 | ORAL_TABLET | ORAL | Status: DC
Start: 2018-05-31 — End: 2018-05-31

## 2018-05-31 MED ORDER — DEXTROSE 50 % IV SOLN
50.0000 mL | Freq: Once | INTRAVENOUS | Status: AC
  Administered 2018-05-31: 50 mL via INTRAVENOUS

## 2018-05-31 MED ORDER — BUPROPION HCL ER (XL) 150 MG PO TB24
300.00 | ORAL_TABLET | ORAL | Status: DC
Start: 2018-05-31 — End: 2018-05-31

## 2018-05-31 MED ORDER — GENERIC EXTERNAL MEDICATION
5.00 | Status: DC
Start: ? — End: 2018-05-31

## 2018-05-31 MED ORDER — ACETAMINOPHEN 650 MG RE SUPP: 650.0000 mg | Freq: Four times a day (QID) | RECTAL | Status: DC | PRN

## 2018-05-31 MED ORDER — INSULIN LISPRO 100 UNIT/ML ~~LOC~~ SOLN
17.00 | SUBCUTANEOUS | Status: DC
Start: 2018-05-30 — End: 2018-05-31

## 2018-05-31 MED ORDER — GABAPENTIN 300 MG PO CAPS
300.0000 mg | ORAL_CAPSULE | Freq: Once | ORAL | Status: AC
  Administered 2018-05-31: 300 mg via ORAL
  Filled 2018-05-31: qty 1

## 2018-05-31 MED ORDER — POTASSIUM CL IN DEXTROSE 5% 20 MEQ/L IV SOLN
20.0000 meq | INTRAVENOUS | Status: DC
  Administered 2018-05-31: 20 meq via INTRAVENOUS
  Filled 2018-05-31 (×3): qty 1000

## 2018-05-31 MED ORDER — PREGABALIN 75 MG PO CAPS
150.0000 mg | ORAL_CAPSULE | Freq: Two times a day (BID) | ORAL | Status: DC
  Administered 2018-05-31 – 2018-06-02 (×4): 150 mg via ORAL
  Filled 2018-05-31 (×4): qty 2

## 2018-05-31 MED ORDER — ACETAMINOPHEN 325 MG PO TABS
650.00 | ORAL_TABLET | ORAL | Status: DC
Start: ? — End: 2018-05-31

## 2018-05-31 MED ORDER — ESCITALOPRAM OXALATE 10 MG PO TABS
10.0000 mg | ORAL_TABLET | Freq: Every day | ORAL | Status: DC
  Administered 2018-05-31 – 2018-06-04 (×5): 10 mg via ORAL
  Filled 2018-05-31 (×5): qty 1

## 2018-05-31 NOTE — ED Notes (Signed)
Pt given sandwich tray at this time  

## 2018-05-31 NOTE — ED Provider Notes (Signed)
[facility] Emergency Department Provider Note   ____________________________________________   First MD Initiated Contact with Patient 05/31/18 [phone]     (approximate)  I have reviewed the triage vital signs and the nursing notes.   HISTORY  Chief Complaint Suicidal   HPI Mark [name] is a 32 y.o. male patient reports he took 120 units of insulin as no one will accept him he does not have any word alert live or go he is been turned out were prohibited from going to a number of shelters.  Patient says if he gets any more insulin he will give himself more.  He says there is no point living.  Blood sugar was low when he got here he initially refused to eat but then started eating he is is given D50 started on a D5W drip which is being increased   Past Medical History:  Diagnosis Date  . Depression   . Diabetes mellitus without complication ([place])   . Hydrocephalus   . Kidney stones     Patient Active Problem List   Diagnosis Date Noted  . Suicidal ideation   . Major depressive disorder, recurrent severe without psychotic features ([place])   . Adjustment disorder with depressed mood 12/07/2017  . Noncompliance 10/17/2017  . Dysthymia 10/08/2017  . Personality disorder ([place]) 09/26/2017  . 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 Obstructive hydrocephalus 09/19/2017  . Diabetes ([place]) 09/11/2017  . Hypoglycemia 09/09/2017  . MDD (major depressive disorder) 08/22/2017  . Severe recurrent major depression with psychotic features ([place]) 08/19/2017  . Nausea & vomiting 08/18/2017  . Abdominal pain 08/18/2017  . Hyponatremia 08/18/2017  . DKA (diabetic ketoacidoses) ([place]) 11/24/2015  . Diabetic keto-acidosis ([place]) 10/23/2015    Past Surgical History:  Procedure Laterality Date  . KIDNEY STONE SURGERY      Prior to Admission medications   Medication Sig Start Date End Date Taking? Authorizing Provider  blood glucose meter kit and supplies KIT Dispense based on  patient and insurance preference. Use up to four times daily as directed. (FOR ICD-9 250.00, 250.01). 04/22/18  Yes Schaevitz, Randall An, MD  buPROPion [facility] SR) 150 MG 12 hr tablet Take 150 mg by mouth daily.   Yes [provider]  diphenhydramine-acetaminophen (TYLENOL PM) 25-500 MG TABS tablet Take 2 tablets by mouth at bedtime as needed (for sleep).   Yes [provider]  gabapentin (NEURONTIN) 300 MG capsule Take 300 mg by mouth 3 (three) times daily.   Yes [provider]  hydrOXYzine (ATARAX/VISTARIL) 25 MG tablet Take 50 mg by mouth every 6 (six) hours as needed for anxiety.   Yes [provider]  insulin glargine (LANTUS) 100 UNIT/ML injection Inject 35 Units into the skin at bedtime.   Yes [provider]  insulin lispro (HUMALOG) 100 UNIT/ML injection Inject 18 Units into the skin 3 (three) times daily before meals.   Yes [provider]  Insulin Pen Needle (NOVOFINE) 30G X 8 MM MISC Inject 10 each into the skin as needed. 04/22/18  Yes Schaevitz, Randall An, MD  levothyroxine (SYNTHROID, LEVOTHROID) 100 MCG tablet Take 100 mcg by mouth daily before breakfast.   Yes [provider]  pregabalin (LYRICA) 150 MG capsule Take 150 mg by mouth 2 (two) times daily.   Yes [provider]  gabapentin (NEURONTIN) 400 MG capsule Take 3 capsules (1,200 mg total) by mouth 3 (three) times daily. Patient not taking: Reported on 05/31/2018 02/20/18   Clapacs, Madie Reno, MD  haloperidol decanoate (HALDOL DECANOATE) 100 MG/ML injection Inject 1 mL (100 mg total) into the muscle every 28 (twenty-eight) days. Patient not taking: Reported on 12/06/2017 09/25/17   Pucilowska, Herma Ard B, MD  insulin aspart (NOVOLOG) 100 UNIT/ML injection Inject 0-20 Units into the skin 3 (three) times daily with meals. Patient not taking: Reported on 05/31/2018 04/22/18   Schaevitz, Randall An, MD  insulin detemir (LEVEMIR) 100 UNIT/ML injection Inject 0.19  mLs (19 Units total) into the skin at bedtime. Patient not taking: Reported on 05/31/2018 04/22/18   Orbie Pyo, MD  QUEtiapine (SEROQUEL) 100 MG tablet Take 1 tablet (100 mg total) by mouth every 4 (four) hours as needed (anxiety). Patient not taking: Reported on 05/31/2018 04/22/18   Schaevitz, Randall An, MD  traZODone (DESYREL) 100 MG tablet Take 1 tablet (100 mg total) by mouth at bedtime as needed for sleep. Patient not taking: Reported on 05/31/2018 02/20/18   Clapacs, Madie Reno, MD  venlafaxine XR (EFFEXOR-XR) 150 MG 24 hr capsule Take 2 capsules (300 mg total) by mouth daily with breakfast. Patient not taking: Reported on 05/31/2018 04/22/18   Orbie Pyo, MD    Allergies Mushroom extract complex; Penicillins; Sulfa antibiotics; and Clindamycin/lincomycin  Family History  Problem Relation Age of Onset  . Breast cancer Mother     Social History Social History   Tobacco Use  . Smoking status: Current Every Day Smoker    Packs/day: 0.75    Types: Cigarettes  . Smokeless tobacco: Never Used  Substance Use Topics  . Alcohol use: Never    Frequency: Never    Comment: occassional   . Drug use: Yes    Types: Marijuana    Comment: smoked 2-3 days ago    Review of Systems  Constitutional: No fever/chills Eyes: No visual changes. ENT: No sore throat. Cardiovascular: Denies chest pain. Respiratory: Denies shortness of breath. Gastrointestinal: No abdominal pain.  No nausea, no vomiting.  No diarrhea.  No constipation. Genitourinary: Negative for dysuria. Musculoskeletal: Negative for back pain. Skin: Negative for rash. Neurological: Negative for headaches, focal weakness   ____________________________________________   PHYSICAL EXAM:  VITAL SIGNS: ED Triage Vitals  Enc Vitals Group     BP 05/31/18 0933 (!) 144/84     Pulse Rate 05/31/18 0933 (!) 107     Resp 05/31/18 0933 16     Temp 05/31/18 0933 98.4 F (36.9 C)     Temp Source 05/31/18 0933  Oral     SpO2 05/31/18 0933 96 %     Weight 05/31/18 0927 250 lb (113.4 kg)     Height 05/31/18 0927 6' 2"  (1.88 m)     Head Circumference --      Peak Flow --      Pain Score 05/31/18 0927 8     Pain Loc --      Pain Edu? --      Excl. in [place]? --     Constitutional: Alert and oriented. Well appearing and in no acute distress. Eyes: Conjunctivae are normal. PERRL. EOMI. Head: Atraumatic. Nose: No congestion/rhinnorhea. Mouth/Throat: Mucous membranes are moist.  Oropharynx non-erythematous. Neck: No stridor.   Cardiovascular: Normal rate, regular rhythm. Grossly normal heart sounds.  Good peripheral circulation. Respiratory: Normal respiratory effort.  No retractions. Lungs CTAB. Gastrointestinal: Soft tender all over later in the epigastric area. No distention. No abdominal bruits. No CVA tenderness. Musculoskeletal: No lower extremity tenderness nor edema.  No tenderness in the back to palpation percussion Neurologic:  Normal speech and language. No gross focal neurologic deficits are appreciated. Skin:  Skin is warm, dry and intact. No rash noted. Psychiatric: Mood and affect are normal. Speech and behavior are normal.  ____________________________________________   LABS (all labs ordered are listed, but only abnormal results are displayed)  Labs Reviewed  GLUCOSE, CAPILLARY - Abnormal; Notable for the following components:      Result Value   Glucose-Capillary 27 (*)    All other components within normal limits  GLUCOSE, CAPILLARY - Abnormal; Notable for the following components:   Glucose-Capillary 32 (*)    All other components within normal limits  COMPREHENSIVE METABOLIC PANEL - Abnormal; Notable for the following components:   Potassium 2.8 (*)    Glucose, Bld 40 (*)    All other components within normal limits  ETHANOL - Abnormal; Notable for the following components:   Alcohol, Ethyl (B) 12 (*)    All other components within normal limits  ACETAMINOPHEN LEVEL -  Abnormal; Notable for the following components:   Acetaminophen (Tylenol), Serum <10 (*)    All other components within normal limits  GLUCOSE, CAPILLARY - Abnormal; Notable for the following components:   Glucose-Capillary 104 (*)    All other components within normal limits  GLUCOSE, CAPILLARY - Abnormal; Notable for the following components:   Glucose-Capillary 55 (*)    All other components within normal limits  GLUCOSE, CAPILLARY - Abnormal; Notable for the following components:   Glucose-Capillary 47 (*)    All other components within normal limits  GLUCOSE, CAPILLARY - Abnormal; Notable for the following components:   Glucose-Capillary 58 (*)    All other components within normal limits  GLUCOSE, CAPILLARY - Abnormal; Notable for the following components:   Glucose-Capillary 50 (*)    All other components within normal limits  GLUCOSE, CAPILLARY - Abnormal; Notable for the following components:   Glucose-Capillary 44 (*)    All other components within normal limits  GLUCOSE, CAPILLARY - Abnormal; Notable for the following components:   Glucose-Capillary 55 (*)    All other components within normal limits  GLUCOSE, CAPILLARY - Abnormal; Notable for the following components:   Glucose-Capillary 58 (*)    All other components within normal limits  SALICYLATE LEVEL  CBC  GLUCOSE, CAPILLARY  GLUCOSE, CAPILLARY  URINE DRUG SCREEN, QUALITATIVE ([facility] ONLY)  CBG MONITORING, ED  CBG MONITORING, ED  CBG MONITORING, ED   ____________________________________________  EKG   ____________________________________________  RADIOLOGY  ED MD interpretation:   Official radiology report(s): No results found.  ____________________________________________   PROCEDURES  Procedure(s) performed:   Procedures  Critical Care performed:   ____________________________________________   INITIAL IMPRESSION / ASSESSMENT AND PLAN / ED COURSE  Patient continues to have his blood  sugar dropped if his D5 drip is less than 100.  We are wondering if he may have taken something besides regular human insulin.  We will try to admit him.      ____________________________________________   FINAL CLINICAL IMPRESSION(S) / ED DIAGNOSES  Final diagnoses:  Insulin overdose, intentional self-harm, sequela ([place])  Suicidal ideation     ED Discharge Orders    None       Note:  This document was prepared using Dragon voice recognition software and may include unintentional dictation errors.    Nena Polio, MD 05/31/18 [phone]

## 2018-05-31 NOTE — ED Notes (Signed)
[facility] 

## 2018-05-31 NOTE — ED Notes (Signed)
Per EMS pt told them if he has access to his insulin in the room he will take it.

## 2018-05-31 NOTE — Consult Note (Addendum)
[place] [facility] Consult   Reason for Consult:  Suicidal thoughts Referring Physician:  Daneen Schick, MD Patient Identification: Mark [name] MRN:  [id number] Principal Diagnosis: <principal problem not specified> Diagnosis:   Patient Active Problem List   Diagnosis Date Noted  . Suicidal ideation [R45.851]   . Major depressive disorder, recurrent severe without psychotic features ([place]) [F33.2]   . Adjustment disorder with depressed mood [F43.21] 12/07/2017  . Noncompliance [Z91.19] 10/17/2017  . Dysthymia [F34.1] 10/08/2017  . Personality disorder ([place]) [F60.9] 09/26/2017  . 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 Obstructive hydrocephalus [G91.1] 09/19/2017  . Diabetes ([place]) [E11.9] 09/11/2017  . Hypoglycemia [E16.2] 09/09/2017  . MDD (major depressive disorder) [F32.9] 08/22/2017  . Severe recurrent major depression with psychotic features ([place]) [F33.3] 08/19/2017  . Nausea & vomiting [R11.2] 08/18/2017  . Abdominal pain [R10.9] 08/18/2017  . Hyponatremia [E87.1] 08/18/2017  . DKA (diabetic ketoacidoses) ([place]) [E13.10] 11/24/2015  . Diabetic keto-acidosis ([place]) [E13.10] 10/23/2015    Total Time spent with patient: 45 minutes  Subjective:   Mark [name] is a 32 year old Caucasian male very well-known to [facility] with multiple prior inpatient psychiatric hospitalizations who was admitted to the medicine service fter claiming that he took 120 units of insulin in a suicide attempt  The patient was endorsing suicidal thoughts with a plan to overdose by taking too much of his insulin.  He says he feels completely frustrated with his life.  He is homeless and only recently got released from jail in early July.  Patient has not had a stable living situation in over 2 years.  He has very little social support.  He often is very manipulative, endorsing suicidal thoughts when in the hospital.  He has a history of one cutting his wrist once in the past.  He has been on medications when  he was hospitalized but never follows up for outpatient psychotropic medication management or therapy. He denies any current psychotic symptoms ongoing auditory or visual hallucinations.  No paranoid thoughts or delusions.  Affect was irritable and he does appear to have some bipolar mood swings mood swings that are more consistent with a personality disorder versus bipolar disorder. Toxicology screen was positive for marijuana but negative for any other substances.  In the past, there has been some concern of overuse of opioids.  No history of any heavy alcohol use. He has been banned from the shelter in [place] due to past behavioral problems and his diabetes, per the patient    Past psychiatric history Patient has had multiple prior inpatient psychiatric hospitalizations at [facility], [facility] in [place] for suicidal threats.  He has a history of cutting his wrist and now recently overdose.  He tends to voiced suicidal thoughts when he gets frustrated with his current circumstances.  He has been noncompliant with psychotropic medication management and with therapy in the past.  Substance abuse history: There has been some concern for overuse of the past.  Toxicology screen was positive for marijuana which he minimizes.  He denies any cocaine or stimulant use.  Social history The patient is currently single and is never married.  No children.  He is currently homeless and last lived in the [facility] in [place].  His mother passed away which left him homeless with little social support.  He has not worked in over 3 years.  Legal history: The patient has been arrested multiple times with the last charge being position of marijuana.  He denies any access to guns  Risk to Self:   Yes Risk to Others:  Possibly Prior Inpatient Therapy:  Yes Prior Outpatient Therapy:  Yes  Past Medical History:  Past Medical History:  Diagnosis Date  . Depression   . Diabetes mellitus without complication  ([place])   . Hydrocephalus   . Kidney stones     Past Surgical History:  Procedure Laterality Date  . KIDNEY STONE SURGERY     Family History:  Family History  Problem Relation Age of Onset  . Breast cancer Mother     Social History:  Social History   Substance and Sexual Activity  Alcohol Use Never  . Frequency: Never   Comment: occassional      Social History   Substance and Sexual Activity  Drug Use Yes  . Types: Marijuana   Comment: smoked 2-3 days ago    Social History   Socioeconomic History  . Marital status: Single    Spouse name: Not on file  . Number of children: Not on file  . Years of education: Not on file  . Highest education level: Not on file  Occupational History  . Not on file  Social Needs  . Financial resource strain: Not on file  . Food insecurity:    Worry: Not on file    Inability: Not on file  . Transportation needs:    Medical: Not on file    Non-medical: Not on file  Tobacco Use  . Smoking status: Current Every Day Smoker    Packs/day: 0.75    Years: 20.00    Pack years: 15.00    Types: Cigarettes  . Smokeless tobacco: Never Used  Substance and Sexual Activity  . Alcohol use: Never    Frequency: Never    Comment: occassional   . Drug use: Yes    Types: Marijuana    Comment: smoked 2-3 days ago  . Sexual activity: Not on file  Lifestyle  . Physical activity:    Days per week: Not on file    Minutes per session: Not on file  . Stress: Not on file  Relationships  . Social connections:    Talks on phone: Not on file    Gets together: Not on file    Attends religious service: Not on file    Active member of club or organization: Not on file    Attends meetings of clubs or organizations: Not on file    Relationship status: Not on file  Other Topics Concern  . Not on file  Social History Narrative  . Not on file   Additional Social History:    Allergies:   Allergies  Allergen Reactions  . Mushroom Extract Complex  Hives, Itching and Nausea And Vomiting  . Penicillins Anaphylaxis, Hives and Swelling    Has patient had a PCN reaction causing immediate rash, facial/tongue/throat swelling, SOB or lightheadedness with hypotension: Yes Has patient had a PCN reaction causing severe rash involving mucus membranes or skin necrosis: No Has patient had a PCN reaction that required hospitalization Yes Has patient had a PCN reaction occurring within the last 10 years: Yes If all of the above answers are "NO", then may proceed with Cephalosporin use.  . Sulfa Antibiotics Anaphylaxis and Hives  . Clindamycin/Lincomycin Hives    Labs:  Results for orders placed or performed during the hospital encounter of 05/31/18 (from the past 48 hour(s))  Glucose, capillary     Status: Abnormal   Collection Time: 05/31/18  9:21 AM  Result  Value Ref Range   Glucose-Capillary 27 (LL) 70 - 99 mg/dL   Comment 1 Repeat Test   Glucose, capillary     Status: Abnormal   Collection Time: 05/31/18  9:22 AM  Result Value Ref Range   Glucose-Capillary 32 (LL) 70 - 99 mg/dL   Comment 1 Notify RN   Comprehensive metabolic panel     Status: Abnormal   Collection Time: 05/31/18  9:34 AM  Result Value Ref Range   Sodium 141 135 - 145 mmol/L   Potassium 2.8 (L) 3.5 - 5.1 mmol/L   Chloride 107 98 - 111 mmol/L   CO2 26 22 - 32 mmol/L   Glucose, Bld 40 (LL) 70 - 99 mg/dL    Comment: CRITICAL RESULT CALLED TO, READ BACK BY AND VERIFIED WITH ASHLEY ROSS ON 05/31/18 AT 1018 QSD    BUN 13 6 - 20 mg/dL   Creatinine, Ser 1.11 0.61 - 1.24 mg/dL   Calcium 9.4 8.9 - 10.3 mg/dL   Total Protein 7.6 6.5 - 8.1 g/dL   Albumin 4.3 3.5 - 5.0 g/dL   AST 26 15 - 41 U/L   ALT 12 0 - 44 U/L   Alkaline Phosphatase 94 38 - 126 U/L   Total Bilirubin 0.8 0.3 - 1.2 mg/dL   GFR calc non Af Amer >60 >60 mL/min   GFR calc Af Amer >60 >60 mL/min    Comment: (NOTE) The eGFR has been calculated using the CKD EPI equation. This calculation has not been  validated in all clinical situations. eGFR's persistently <60 mL/min signify possible Chronic Kidney Disease.    Anion gap 8 5 - 15    Comment: Performed at [facility], [address] [place], [place] [address]  Ethanol     Status: Abnormal   Collection Time: 05/31/18  9:34 AM  Result Value Ref Range   Alcohol, Ethyl (B) 12 (H) <10 mg/dL    Comment: (NOTE) Lowest detectable limit for serum alcohol is 10 mg/dL. For medical purposes only. Performed at [facility], [address] [place], [place] [address]   Salicylate level     Status: None   Collection Time: 05/31/18  9:34 AM  Result Value Ref Range   Salicylate Lvl <4.9 2.8 - 30.0 mg/dL    Comment: Performed at [facility], [address] [place], [place] [address]  Acetaminophen level     Status: Abnormal   Collection Time: 05/31/18  9:34 AM  Result Value Ref Range   Acetaminophen (Tylenol), Serum <10 (L) 10 - 30 ug/mL    Comment: (NOTE) Therapeutic concentrations vary significantly. A range of 10-30 ug/mL  may be an effective concentration for many patients. However, some  are best treated at concentrations outside of this range. Acetaminophen concentrations >150 ug/mL at 4 hours after ingestion  and >50 ug/mL at 12 hours after ingestion are often associated with  toxic reactions. Performed at [facility], [address] [place], [place] [address]   cbc     Status: None   Collection Time: 05/31/18  9:34 AM  Result Value Ref Range   WBC 9.9 3.8 - 10.6 K/uL   RBC 4.89 4.40 - 5.90 MIL/uL   Hemoglobin 14.8 13.0 - 18.0 g/dL   HCT 42.4 40.0 - 52.0 %   MCV 86.7 80.0 - 100.0 fL   MCH 30.2 26.0 - 34.0 pg   MCHC 34.8 32.0 - 36.0 g/dL   RDW 13.2 11.5 - 14.5 %  Platelets 319 150 - 440 K/uL    Comment: Performed at [facility], [address] [place], [place] [address]  Magnesium     Status: None   Collection Time: 05/31/18  9:34 AM  Result Value Ref Range    Magnesium 1.9 1.7 - 2.4 mg/dL    Comment: Performed at [facility], [address], [place] [address]  Glucose, capillary     Status: Abnormal   Collection Time: 05/31/18  9:46 AM  Result Value Ref Range   Glucose-Capillary 104 (H) 70 - 99 mg/dL   Comment 1 Notify RN   Glucose, capillary     Status: Abnormal   Collection Time: 05/31/18 10:15 AM  Result Value Ref Range   Glucose-Capillary 55 (L) 70 - 99 mg/dL   Comment 1 Notify RN   Glucose, capillary     Status: Abnormal   Collection Time: 05/31/18 10:45 AM  Result Value Ref Range   Glucose-Capillary 47 (L) 70 - 99 mg/dL   Comment 1 Notify RN   Glucose, capillary     Status: None   Collection Time: 05/31/18 11:15 AM  Result Value Ref Range   Glucose-Capillary 86 70 - 99 mg/dL   Comment 1 Notify RN   Glucose, capillary     Status: Abnormal   Collection Time: 05/31/18 11:43 AM  Result Value Ref Range   Glucose-Capillary 58 (L) 70 - 99 mg/dL   Comment 1 Notify RN   Glucose, capillary     Status: Abnormal   Collection Time: 05/31/18 12:13 PM  Result Value Ref Range   Glucose-Capillary 50 (L) 70 - 99 mg/dL   Comment 1 Notify RN   Glucose, capillary     Status: Abnormal   Collection Time: 05/31/18 12:42 PM  Result Value Ref Range   Glucose-Capillary 44 (LL) 70 - 99 mg/dL   Comment 1 Notify RN   Glucose, capillary     Status: Abnormal   Collection Time: 05/31/18  1:14 PM  Result Value Ref Range   Glucose-Capillary 55 (L) 70 - 99 mg/dL   Comment 1 Notify RN   Glucose, capillary     Status: None   Collection Time: 05/31/18  1:45 PM  Result Value Ref Range   Glucose-Capillary 94 70 - 99 mg/dL   Comment 1 Notify RN   Glucose, capillary     Status: Abnormal   Collection Time: 05/31/18  2:15 PM  Result Value Ref Range   Glucose-Capillary 58 (L) 70 - 99 mg/dL   Comment 1 Notify RN   Glucose, capillary     Status: Abnormal   Collection Time: 05/31/18  2:50 PM  Result Value Ref Range   Glucose-Capillary 66  (L) 70 - 99 mg/dL  Glucose, capillary     Status: Abnormal   Collection Time: 05/31/18  3:15 PM  Result Value Ref Range   Glucose-Capillary 61 (L) 70 - 99 mg/dL   Comment 1 Notify RN   Glucose, capillary     Status: None   Collection Time: 05/31/18  3:47 PM  Result Value Ref Range   Glucose-Capillary 70 70 - 99 mg/dL   Comment 1 Notify RN   MRSA PCR Screening     Status: None   Collection Time: 05/31/18  4:55 PM  Result Value Ref Range   MRSA by PCR NEGATIVE NEGATIVE    Comment:        The GeneXpert MRSA Assay (FDA approved for NASAL specimens only), is one  component of a comprehensive MRSA colonization surveillance program. It is not intended to diagnose MRSA infection nor to guide or monitor treatment for MRSA infections. Performed at [facility], [address] [place], [place] [address]   Glucose, capillary     Status: None   Collection Time: 05/31/18  4:56 PM  Result Value Ref Range   Glucose-Capillary 88 70 - 99 mg/dL  Glucose, capillary     Status: Abnormal   Collection Time: 05/31/18  7:43 PM  Result Value Ref Range   Glucose-Capillary 165 (H) 70 - 99 mg/dL    Current Facility-Administered Medications  Medication Dose Route Frequency Provider Last Rate Last Dose  . acetaminophen (TYLENOL) tablet 650 mg  650 mg Oral Q6H PRN Henreitta Leber, MD       Or  . acetaminophen (TYLENOL) suppository 650 mg  650 mg Rectal Q6H PRN Sainani, Belia Heman, MD      . dextrose 5 % with KCl 20 mEq / L  infusion  20 mEq Intravenous Continuous Henreitta Leber, MD 100 mL/hr at 05/31/18 1804 20 mEq at 05/31/18 1804  . dextrose 50 % solution           . enoxaparin (LOVENOX) injection 40 mg  40 mg Subcutaneous Q24H Sainani, Vivek J, MD      . escitalopram (LEXAPRO) tablet 10 mg  10 mg Oral Daily [name] Mann, MD      . gabapentin (NEURONTIN) capsule 600 mg  600 mg Oral TID Henreitta Leber, MD   600 mg at 05/31/18 1804  . hydrOXYzine (ATARAX/VISTARIL) tablet 50 mg  50 mg  Oral Q6H PRN Henreitta Leber, MD      . ondansetron (ZOFRAN) tablet 4 mg  4 mg Oral Q6H PRN Henreitta Leber, MD       Or  . ondansetron (ZOFRAN) injection 4 mg  4 mg Intravenous Q6H PRN Henreitta Leber, MD      . potassium chloride SA (K-DUR,KLOR-CON) CR tablet 20 mEq  20 mEq Oral BID Henreitta Leber, MD   20 mEq at 05/31/18 1650  . pregabalin (LYRICA) capsule 150 mg  150 mg Oral BID Henreitta Leber, MD        Musculoskeletal: Strength & Muscle Tone: within normal limits Gait & Station: normal Patient leans: N/A  Psychiatric Specialty Exam: Physical Exam  Nursing note and vitals reviewed.   ROS: See H+P  Blood pressure 105/71, pulse 83, temperature 98.2 F (36.8 C), temperature source Oral, resp. rate 16, height 6' 2"  (1.88 m), weight 113.4 kg, SpO2 98 %.Body mass index is 32.1 kg/m.  General Appearance: Casual  Eye Contact:  Fair  Speech:  Loud at times, demanding  Volume:  Normal  Mood:  Depressed  Affect:  Irritable  Thought Process:  Coherent, Goal Directed and Linear  Orientation:  Full (Time, Place, and Person)  Thought Content:  Illogical  Suicidal Thoughts:  Yes.  with intent/plan  Homicidal Thoughts:  No  Memory:  Immediate;   Good  Judgement:  Impaired  Insight:  Lacking  Psychomotor Activity:  Normal  Concentration:  Concentration: Good and Attention Span: Good  Recall:  Good  Fund of Knowledge:  Good  Language:  Good  Akathisia:  No  Handed:  Right  AIMS (if indicated):     Assets:  Communication Skills  ADL's:  Intact  Cognition:  WNL  Sleep:        Treatment Plan Summary:  Mark [name]  is a 32 year old Caucasian male with history of personality disorder as well as recurrent depression who was admitted to the medicine service after taking supposedly 120 units of insulin in a suicide attempt.  At this time, he will remain under involuntary commitment until he is no longer endorsing suicidal thoughts.  The patient remains insistent that if he is  discharged he will kill himself.  He can be extremely manipulative and passive-aggressive with physicians in the hospital.  Major depressive disorder, recurrent, personality disorder: Will discontinue Wellbutrin as it may be contributing to irritability and should not be used in case of seizure risk with extreme blood sugar changes.  We will plan to start Lexapro 10 mg p.o. daily for anxiety depression.  He would greatly benefit from a mood stabilizer but refuses.  We will continue with a one-to-one sitter and he will remain under involuntary commitment.  At this time, the patient will remain under involuntary commitment with a one-to-one sitter secondary to adamantly endorsing suicidal intent in the hospital.  Will reassess on a daily basis.  He will most likely be able to be discharged from the medicine service as he does not greatly benefit from inpatient psychiatric hospitalization.     Daily contact with patient to assess and evaluate symptoms and progress in treatment and Medication management   [name] Mann, MD 05/31/2018 7:45 PM

## 2018-05-31 NOTE — ED Notes (Signed)
Pt up and eating - on his 2nd tray of food.

## 2018-05-31 NOTE — ED Notes (Signed)
Pt given graham crackers, chips and diet coke to drink, Lunch tray also ordered. Pt alert and oriented. Sitter at bedside.

## 2018-05-31 NOTE — H&P (Signed)
[facility] NAME: Mark [name]    MR#:  [id number]  DATE OF BIRTH:  January 19, 1986  DATE OF ADMISSION:  05/31/2018  PRIMARY CARE PHYSICIAN: Patient, No Pcp Per   REQUESTING/REFERRING PHYSICIAN: Dr. Conni Slipper  CHIEF COMPLAINT:   Chief Complaint  Patient presents with  . Suicidal  Hypoglycemia  HISTORY OF PRESENT ILLNESS:  Mark [name]  is a 32 y.o. male with a known history of diabetes, depression, history of nephrolithiasis, previous admissions due to suicide attempt from taking increasing doses of insulin presents to the hospital today complaining of suicidal ideations and taking 120 units of regular insulin earlier this morning.  Patient has been in the ER and is having his fingersticks monitored every hour but despite being on a dextrose drip and getting pulse doses of IV dextrose and eating he continues to be hypoglycemic and therefore hospitalist services contacted for admission.  Patient does admit to suicidal ideations but denies homicidal ideations or any hallucinations.  He denies any other associated symptoms presently.  PAST MEDICAL HISTORY:   Past Medical History:  Diagnosis Date  . Depression   . Diabetes mellitus without complication ([facility])   . Hydrocephalus   . Kidney stones     PAST SURGICAL HISTORY:   Past Surgical History:  Procedure Laterality Date  . KIDNEY STONE SURGERY      SOCIAL HISTORY:   Social History   Tobacco Use  . Smoking status: Current Every Day Smoker    Packs/day: 0.75    Years: 20.00    Pack years: 15.00    Types: Cigarettes  . Smokeless tobacco: Never Used  Substance Use Topics  . Alcohol use: Never    Frequency: Never    Comment: occassional     FAMILY HISTORY:   Family History  Problem Relation Age of Onset  . Breast cancer Mother     DRUG ALLERGIES:   Allergies  Allergen Reactions  . Mushroom Extract Complex Hives, Itching and Nausea And Vomiting  .  Penicillins Anaphylaxis, Hives and Swelling    Has patient had a PCN reaction causing immediate rash, facial/tongue/throat swelling, SOB or lightheadedness with hypotension: Yes Has patient had a PCN reaction causing severe rash involving mucus membranes or skin necrosis: No Has patient had a PCN reaction that required hospitalization Yes Has patient had a PCN reaction occurring within the last 10 years: Yes If all of the above answers are "NO", then may proceed with Cephalosporin use.  . Sulfa Antibiotics Anaphylaxis and Hives  . Clindamycin/Lincomycin Hives    REVIEW OF SYSTEMS:   Review of Systems  Constitutional: Negative for fever and weight loss.  HENT: Negative for congestion, nosebleeds and tinnitus.   Eyes: Negative for blurred vision, double vision and redness.  Respiratory: Negative for cough, hemoptysis and shortness of breath.   Cardiovascular: Negative for chest pain, orthopnea, leg swelling and PND.  Gastrointestinal: Negative for abdominal pain, diarrhea, melena, nausea and vomiting.  Genitourinary: Negative for dysuria, hematuria and urgency.  Musculoskeletal: Negative for falls and joint pain.  Neurological: Negative for dizziness, tingling, sensory change, focal weakness, seizures, weakness and headaches.  Endo/Heme/Allergies: Negative for polydipsia. Does not bruise/bleed easily.  Psychiatric/Behavioral: Positive for depression and suicidal ideas. Negative for memory loss. The patient is not nervous/anxious.     MEDICATIONS AT HOME:   Prior to Admission medications   Medication Sig Start Date End Date Taking? Authorizing Provider  blood glucose meter kit and supplies KIT  Dispense based on patient and insurance preference. Use up to four times daily as directed. (FOR ICD-9 250.00, 250.01). 04/22/18  Yes Schaevitz, Randall An, MD  buPROPion [facility] SR) 150 MG 12 hr tablet Take 150 mg by mouth daily.   Yes [provider]  diphenhydramine-acetaminophen  (TYLENOL PM) 25-500 MG TABS tablet Take 2 tablets by mouth at bedtime as needed (for sleep).   Yes [provider]  gabapentin (NEURONTIN) 300 MG capsule Take 300 mg by mouth 3 (three) times daily.   Yes [provider]  hydrOXYzine (ATARAX/VISTARIL) 25 MG tablet Take 50 mg by mouth every 6 (six) hours as needed for anxiety.   Yes [provider]  insulin glargine (LANTUS) 100 UNIT/ML injection Inject 35 Units into the skin at bedtime.   Yes [provider]  insulin lispro (HUMALOG) 100 UNIT/ML injection Inject 18 Units into the skin 3 (three) times daily before meals.   Yes [provider]  Insulin Pen Needle (NOVOFINE) 30G X 8 MM MISC Inject 10 each into the skin as needed. 04/22/18  Yes Schaevitz, Randall An, MD  levothyroxine (SYNTHROID, LEVOTHROID) 100 MCG tablet Take 100 mcg by mouth daily before breakfast.   Yes [provider]  pregabalin (LYRICA) 150 MG capsule Take 150 mg by mouth 2 (two) times daily.   Yes [provider]  gabapentin (NEURONTIN) 400 MG capsule Take 3 capsules (1,200 mg total) by mouth 3 (three) times daily. Patient not taking: Reported on 05/31/2018 02/20/18   Clapacs, Madie Reno, MD  haloperidol decanoate (HALDOL DECANOATE) 100 MG/ML injection Inject 1 mL (100 mg total) into the muscle every 28 (twenty-eight) days. Patient not taking: Reported on 12/06/2017 09/25/17   Pucilowska, Herma Ard B, MD  insulin aspart (NOVOLOG) 100 UNIT/ML injection Inject 0-20 Units into the skin 3 (three) times daily with meals. Patient not taking: Reported on 05/31/2018 04/22/18   Schaevitz, Randall An, MD  insulin detemir (LEVEMIR) 100 UNIT/ML injection Inject 0.19 mLs (19 Units total) into the skin at bedtime. Patient not taking: Reported on 05/31/2018 04/22/18   Orbie Pyo, MD  QUEtiapine (SEROQUEL) 100 MG tablet Take 1 tablet (100 mg total) by mouth every 4 (four) hours as needed (anxiety). Patient not taking: Reported on  05/31/2018 04/22/18   Schaevitz, Randall An, MD  traZODone (DESYREL) 100 MG tablet Take 1 tablet (100 mg total) by mouth at bedtime as needed for sleep. Patient not taking: Reported on 05/31/2018 02/20/18   Clapacs, Madie Reno, MD  venlafaxine XR (EFFEXOR-XR) 150 MG 24 hr capsule Take 2 capsules (300 mg total) by mouth daily with breakfast. Patient not taking: Reported on 05/31/2018 04/22/18   Orbie Pyo, MD      VITAL SIGNS:  Blood pressure 120/82, pulse (!) 107, temperature 98.4 F (36.9 C), temperature source Oral, resp. rate 16, height 6' 2"  (1.88 m), weight 113.4 kg, SpO2 96 %.  PHYSICAL EXAMINATION:  Physical Exam  GENERAL:  32 y.o.-year-old unkempt patient lying in the bed with no acute distress.  EYES: Pupils equal, round, reactive to light and accommodation. No scleral icterus. Extraocular muscles intact.  HEENT: Head atraumatic, normocephalic. Oropharynx and nasopharynx clear. No oropharyngeal erythema, moist oral mucosa. + poor dentition.  NECK:  Supple, no jugular venous distention. No thyroid enlargement, no tenderness.  LUNGS: Normal breath sounds bilaterally, no wheezing, rales, rhonchi. No use of accessory muscles of respiration.  CARDIOVASCULAR: S1, S2 RRR. No murmurs, rubs, gallops, clicks.  ABDOMEN: Soft, nontender, nondistended. Bowel sounds present.  No organomegaly or mass.  EXTREMITIES: No pedal edema, cyanosis, or clubbing. + 2 pedal & radial pulses b/l.   NEUROLOGIC: Cranial nerves II through XII are intact. No focal Motor or sensory deficits appreciated b/l.   PSYCHIATRIC: The patient is alert and oriented x 3.  SKIN: No obvious rash, lesion, or ulcer.   LABORATORY PANEL:   CBC Recent Labs  Lab 05/31/18 0934  WBC 9.9  HGB 14.8  HCT 42.4  PLT 319   ------------------------------------------------------------------------------------------------------------------  Chemistries  Recent Labs  Lab 05/31/18 0934  NA 141  K 2.8*  CL 107  CO2 26   GLUCOSE 40*  BUN 13  CREATININE 1.11  CALCIUM 9.4  AST 26  ALT 12  ALKPHOS 94  BILITOT 0.8   ------------------------------------------------------------------------------------------------------------------  Cardiac Enzymes No results for input(s): TROPONINI in the last 168 hours. ------------------------------------------------------------------------------------------------------------------  RADIOLOGY:  No results found.   IMPRESSION AND PLAN:   32 year old male with past medical history of diabetes, depression, previous history of suicide attempts due to increasing insulin intake, neuropathy due to diabetes who presents to the hospital due to suicidal ideations and taking 120 units of regular insulin this morning.  1.  Hypoglycemia-secondary to increase intake of insulin due to suicide attempt. -Despite getting dextrose boluses, and being placed on a D5 drip patient continues to be hypoglycemic. - We will continue D5W with potassium.  Follow blood sugars closely.  2.  Depression/suicide attempt- patient has done this in the past.  He had a recent admission in July for similar complaints and reasons.  Patient admits to suicidal ideations.  He does not see a psychiatrist. -Patient is currently under IVC, will get psychiatric consult.  Patient needs to have his meds adjusted. -Continue Wellbutrin.  3.  Hypokalemia- we will place patient on oral potassium supplements, repeat level in the morning. -Check magnesium level.  4.  Diabetic neuropathy-continue gabapentin, Lyrica    All the records are reviewed and case discussed with ED provider. Management plans discussed with the patient, family and they are in agreement.  CODE STATUS: Full code  TOTAL TIME TAKING CARE OF THIS PATIENT: 40 minutes.    Henreitta Leber M.D on 05/31/2018 at 3:08 PM  Between 7am to 6pm - Pager - [phone]  After 6pm go to [email or web address] - password [email or web address]   Office  [phone]  CC: Primary care physician; Patient, No Pcp Per

## 2018-05-31 NOTE — ED Notes (Signed)
bs 94 - dextrose turned back down to 75. Pt ate 2 lunch trays and drank one soda.

## 2018-05-31 NOTE — ED Notes (Signed)
Report received. Pt's current bs 86. Alert, oriented. Will go to quad once medically cleared. Needs dressed out.

## 2018-05-31 NOTE — ED Notes (Signed)
Pt given 2 juices. Easily arousable. Dextrose up to 70 per dr. Marta Lamasmalinida.

## 2018-05-31 NOTE — ED Notes (Signed)
Pt asking if he is IVC, pt notifiied that he was, pt states "good, that buys me a couple of days"

## 2018-05-31 NOTE — ED Notes (Signed)
Pt diaphoretic and c/o feeling dizzy CBG checked was 27, pt able to drink 8 oz of orange juice and given amp of D50. Pt cooperating with ED staff.

## 2018-05-31 NOTE — ED Notes (Signed)
Pt given lunch tray and soda pop and dextrose up to 100 for now.

## 2018-05-31 NOTE — ED Notes (Signed)
Pt given graham crackers and potato chips to eat.

## 2018-05-31 NOTE — ED Notes (Signed)
Dextrose turned back up to 100. Pt given another soda. Eating graham crackers.

## 2018-05-31 NOTE — ED Notes (Signed)
Report given. Pt maintaining glucose around 70 with dextrose maintained at 100. New bag of d5 started.

## 2018-05-31 NOTE — ED Notes (Signed)
1:1 Sitter at bedside at this time.  This RN has remained with patient from arrival to ED until the sitter arrived.

## 2018-05-31 NOTE — ED Notes (Signed)
Patient Belongings:  Patient has a total of 5 bags.  Belongings include: plastic grocery bag with graham crackers, insulin syringes, relion meter with lancets and strips Plastic bag with several cups of juice, Plastic bag with paperwork from [facility], [facility] and composition book. Plastic bag with multiple toiletry items and 2 cell phones with charge cord Plastic garbage bag with clothing, underwear, socks, pants, some clothing which is soiled and has an odor.   Pt has bag of medications that includes insulin that have not been sent to the pharmacy yet due to meds not reconciled by the pharmacy tech.  I informed the pharmacy tech that the medications are with the nurse and will need to be sent to the pharmacy once reconciled.

## 2018-05-31 NOTE — ED Notes (Signed)
Informed Dr. Darnelle CatalanMalinda of blood sugar, new orders received.

## 2018-05-31 NOTE — ED Notes (Signed)
Pt is being dressed out at this time by this EDT, pt belongings consist of: blue boxers, blue jeans, grey and green tennis shoes, white tshirt, green socks, tan belt, brown wallet, pack of pallmall cigarettes, blue lighter

## 2018-05-31 NOTE — ED Triage Notes (Signed)
Pt arrived via EMS with reports of low blood sugar after taking overdose of insulin this morning after eating breakfast.    Per EMS pt states he took 120 units a little bit before 0900 of Humulin in a suicide attempt, pt states he is tired of living like this and has no where else to go.   [facility] at homeless shelter.  Per EMS pt was refusing to eat or drink anything.

## 2018-06-01 LAB — BASIC METABOLIC PANEL
ANION GAP: 6 (ref 5–15)
BUN: 12 mg/dL (ref 6–20)
CALCIUM: 8.8 mg/dL — AB (ref 8.9–10.3)
CO2: 25 mmol/L (ref 22–32)
Chloride: 104 mmol/L (ref 98–111)
Creatinine, Ser: 1 mg/dL (ref 0.61–1.24)
GFR calc Af Amer: >60 mL/min (ref >60)
GLUCOSE: 373 mg/dL — AB (ref 70–99)
Potassium: 4.8 mmol/L (ref 3.5–5.1)
Sodium: 135 mmol/L (ref 135–145)

## 2018-06-01 LAB — GLUCOSE, CAPILLARY
GLUCOSE-CAPILLARY: 458 mg/dL — AB (ref 70–99)
GLUCOSE-CAPILLARY: 357 mg/dL — AB (ref 70–99)
GLUCOSE-CAPILLARY: 87 mg/dL (ref 70–99)
GLUCOSE-CAPILLARY: 48 mg/dL — AB (ref 70–99)
GLUCOSE-CAPILLARY: 62 mg/dL — AB (ref 70–99)
GLUCOSE-CAPILLARY: 63 mg/dL — AB (ref 70–99)
GLUCOSE-CAPILLARY: 148 mg/dL — AB (ref 70–99)
GLUCOSE-CAPILLARY: 166 mg/dL — AB (ref 70–99)
Glucose-Capillary: 363 mg/dL — ABNORMAL HIGH (ref 70–99)
Glucose-Capillary: 317 mg/dL — ABNORMAL HIGH (ref 70–99)
Glucose-Capillary: 420 mg/dL — ABNORMAL HIGH (ref 70–99)
Glucose-Capillary: 237 mg/dL — ABNORMAL HIGH (ref 70–99)
Glucose-Capillary: 105 mg/dL — ABNORMAL HIGH (ref 70–99)

## 2018-06-01 LAB — CBC
HCT: 39.8 % — ABNORMAL LOW (ref 40.0–52.0)
Hemoglobin: 13.8 g/dL (ref 13.0–18.0)
MCH: 30.5 pg (ref 26.0–34.0)
MCHC: 34.7 g/dL (ref 32.0–36.0)
MCV: 87.8 fL (ref 80.0–100.0)
Platelets: 253 10*3/uL (ref 150–440)
RBC: 4.53 MIL/uL (ref 4.40–5.90)
RDW: 13.3 % (ref 11.5–14.5)
WBC: 7.3 10*3/uL (ref 3.8–10.6)

## 2018-06-01 MED ORDER — INSULIN ASPART 100 UNIT/ML ~~LOC~~ SOLN
15.0000 [IU] | Freq: Three times a day (TID) | SUBCUTANEOUS | Status: DC
  Administered 2018-06-01: 15 [IU] via SUBCUTANEOUS
  Filled 2018-06-01: qty 1

## 2018-06-01 MED ORDER — INSULIN GLARGINE 100 UNIT/ML ~~LOC~~ SOLN
25.0000 [IU] | Freq: Every day | SUBCUTANEOUS | Status: DC
  Filled 2018-06-01: qty 0.25

## 2018-06-01 MED ORDER — QUETIAPINE FUMARATE 25 MG PO TABS
50.0000 mg | ORAL_TABLET | Freq: Every day | ORAL | Status: DC
Start: 2018-06-01 — End: 2018-06-03
  Administered 2018-06-01 – 2018-06-02 (×2): 50 mg via ORAL
  Filled 2018-06-01 (×3): qty 2

## 2018-06-01 MED ORDER — INSULIN ASPART 100 UNIT/ML ~~LOC~~ SOLN: 0.0000 [IU] | Freq: Three times a day (TID) | SUBCUTANEOUS | Status: DC

## 2018-06-01 MED ORDER — DIVALPROEX SODIUM 500 MG PO DR TAB
500.0000 mg | DELAYED_RELEASE_TABLET | Freq: Two times a day (BID) | ORAL | Status: DC
  Administered 2018-06-01 – 2018-06-04 (×7): 500 mg via ORAL
  Filled 2018-06-01 (×8): qty 1

## 2018-06-01 MED ORDER — INSULIN ASPART 100 UNIT/ML ~~LOC~~ SOLN
0.0000 [IU] | Freq: Three times a day (TID) | SUBCUTANEOUS | Status: DC
  Administered 2018-06-01: 7 [IU] via SUBCUTANEOUS
  Filled 2018-06-01: qty 1

## 2018-06-01 MED ORDER — INSULIN ASPART 100 UNIT/ML ~~LOC~~ SOLN
0.0000 [IU] | Freq: Every day | SUBCUTANEOUS | Status: DC
  Administered 2018-06-02: 2 [IU] via SUBCUTANEOUS
  Administered 2018-06-03: 3 [IU] via SUBCUTANEOUS
  Filled 2018-06-01 (×2): qty 1

## 2018-06-01 MED ORDER — INSULIN ASPART 100 UNIT/ML ~~LOC~~ SOLN
0.0000 [IU] | Freq: Three times a day (TID) | SUBCUTANEOUS | Status: DC
  Administered 2018-06-01: 5 [IU] via SUBCUTANEOUS
  Administered 2018-06-02 (×2): 15 [IU] via SUBCUTANEOUS
  Filled 2018-06-01 (×3): qty 1

## 2018-06-01 MED ORDER — QUETIAPINE FUMARATE 25 MG PO TABS
50.0000 mg | ORAL_TABLET | Freq: Once | ORAL | Status: AC
  Administered 2018-06-01: 50 mg via ORAL
  Filled 2018-06-01: qty 2

## 2018-06-01 MED ORDER — INSULIN GLARGINE 100 UNIT/ML ~~LOC~~ SOLN
15.0000 [IU] | Freq: Every day | SUBCUTANEOUS | Status: DC
  Filled 2018-06-01: qty 0.15

## 2018-06-01 MED ORDER — INSULIN GLARGINE 100 UNIT/ML ~~LOC~~ SOLN: 20.0000 [IU] | Freq: Every day | SUBCUTANEOUS | Status: DC

## 2018-06-01 MED ORDER — INSULIN ASPART 100 UNIT/ML ~~LOC~~ SOLN
10.0000 [IU] | Freq: Once | SUBCUTANEOUS | Status: AC
  Administered 2018-06-01: 10 [IU] via SUBCUTANEOUS
  Filled 2018-06-01: qty 1

## 2018-06-01 MED ORDER — INSULIN DETEMIR 100 UNIT/ML ~~LOC~~ SOLN
15.0000 [IU] | Freq: Once | SUBCUTANEOUS | Status: AC
Start: 2018-06-01 — End: 2018-06-01
  Administered 2018-06-01: 15 [IU] via SUBCUTANEOUS
  Filled 2018-06-01: qty 0.15

## 2018-06-01 MED ORDER — POTASSIUM CL IN DEXTROSE 5% 20 MEQ/L IV SOLN
20.0000 meq | INTRAVENOUS | Status: DC
  Administered 2018-06-01: 20 meq via INTRAVENOUS
  Filled 2018-06-01: qty 1000

## 2018-06-01 MED ORDER — INSULIN DETEMIR 100 UNIT/ML ~~LOC~~ SOLN: 19.0000 [IU] | Freq: Every day | SUBCUTANEOUS | Status: DC

## 2018-06-01 NOTE — Consult Note (Signed)
[facility] Psychiatry Consult   Reason for Consult:  Suicidal thoughts Referring Physician:  Daneen Schick, MD Patient Identification: Mark [name] MRN:  [id number] Principal Diagnosis: Major Depressive Disorder   Diagnosis:   Patient Active Problem List   Diagnosis Date Noted  . Suicidal ideation [R45.851]   . Major depressive disorder, recurrent severe without psychotic features ([place]) [F33.2]   . Adjustment disorder with depressed mood [F43.21] 12/07/2017  . Noncompliance [Z91.19] 10/17/2017  . Dysthymia [F34.1] 10/08/2017  . Personality disorder ([place]) [F60.9] 09/26/2017  . 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 Obstructive hydrocephalus [G91.1] 09/19/2017  . Diabetes ([place]) [E11.9] 09/11/2017  . Hypoglycemia [E16.2] 09/09/2017  . MDD (major depressive disorder) [F32.9] 08/22/2017  . Severe recurrent major depression with psychotic features ([place]) [F33.3] 08/19/2017  . Nausea & vomiting [R11.2] 08/18/2017  . Abdominal pain [R10.9] 08/18/2017  . Hyponatremia [E87.1] 08/18/2017  . DKA (diabetic ketoacidoses) ([place]) [E13.10] 11/24/2015  . Diabetic keto-acidosis ([place]) [E13.10] 10/23/2015    Total Time spent with patient: 20 minutes  Subjective:   No significant changes today.  The patient continues to threaten suicide if discharged from the hospital.  He says he wants help and come to the hospital for help.  He says he would overdose on his insulin in order to commit suicide.  He continues to blame others for problems in life and current circumstances.  He denies any current psychotic symptoms including auditory or visual hallucinations.  No paranoid thoughts or delusions.  Patient did get Seroquel last night and says he did not sleep well.  He has been compliant with the Lexapro.  He was agreeable to starting Depakote for mood stabilization.     Past psychiatric history Patient has had multiple prior inpatient psychiatric hospitalizations at [facility] in [place] for  suicidal threats.  He has a history of cutting his wrist and now recently overdose.  He tends to voiced suicidal thoughts when he gets frustrated with his current circumstances.  He has been noncompliant with psychotropic medication management and with therapy in the past.  Substance abuse history: There has been some concern for overuse of the past.  Toxicology screen was positive for marijuana which he minimizes.  He denies any cocaine or stimulant use.  Social history The patient is currently single and is never married.  No children.  He is currently homeless and last lived in the [place] in [place].  His mother passed away which left him homeless with little social support.  He has not worked in over 3 years.  Legal history: The patient has been arrested multiple times with the last charge being position of marijuana.  He denies any access to guns   Risk to Self:   Yes Risk to Others:  Possibly Prior Inpatient Therapy:  Yes Prior Outpatient Therapy:  Yes  Past Medical History:  Past Medical History:  Diagnosis Date  . Depression   . Diabetes mellitus without complication ([place])   . Hydrocephalus   . Kidney stones     Past Surgical History:  Procedure Laterality Date  . KIDNEY STONE SURGERY     Family History:  Family History  Problem Relation Age of Onset  . Breast cancer Mother     Social History:  Social History   Substance and Sexual Activity  Alcohol Use Never  . Frequency: Never   Comment: occassional      Social History   Substance and Sexual Activity  Drug Use Yes  . Types: Marijuana  Comment: smoked 2-3 days ago    Social History   Socioeconomic History  . Marital status: Single    Spouse name: Not on file  . Number of children: Not on file  . Years of education: Not on file  . Highest education level: Not on file  Occupational History  . Not on file  Social Needs  . Financial resource strain: Not on file  . Food insecurity:     Worry: Not on file    Inability: Not on file  . Transportation needs:    Medical: Not on file    Non-medical: Not on file  Tobacco Use  . Smoking status: Current Every Day Smoker    Packs/day: 0.75    Years: 20.00    Pack years: 15.00    Types: Cigarettes  . Smokeless tobacco: Never Used  Substance and Sexual Activity  . Alcohol use: Never    Frequency: Never    Comment: occassional   . Drug use: Yes    Types: Marijuana    Comment: smoked 2-3 days ago  . Sexual activity: Not on file  Lifestyle  . Physical activity:    Days per week: Not on file    Minutes per session: Not on file  . Stress: Not on file  Relationships  . Social connections:    Talks on phone: Not on file    Gets together: Not on file    Attends religious service: Not on file    Active member of club or organization: Not on file    Attends meetings of clubs or organizations: Not on file    Relationship status: Not on file  Other Topics Concern  . Not on file  Social History Narrative  . Not on file   Additional Social History:    Allergies:   Allergies  Allergen Reactions  . Mushroom Extract Complex Hives, Itching and Nausea And Vomiting  . Penicillins Anaphylaxis, Hives and Swelling    Has patient had a PCN reaction causing immediate rash, facial/tongue/throat swelling, SOB or lightheadedness with hypotension: Yes Has patient had a PCN reaction causing severe rash involving mucus membranes or skin necrosis: No Has patient had a PCN reaction that required hospitalization Yes Has patient had a PCN reaction occurring within the last 10 years: Yes If all of the above answers are "NO", then may proceed with Cephalosporin use.  . Sulfa Antibiotics Anaphylaxis and Hives  . Clindamycin/Lincomycin Hives    Labs:  Results for orders placed or performed during the hospital encounter of 05/31/18 (from the past 48 hour(s))  Glucose, capillary     Status: Abnormal   Collection Time: 05/31/18  9:21 AM   Result Value Ref Range   Glucose-Capillary 27 (LL) 70 - 99 mg/dL   Comment 1 Repeat Test   Glucose, capillary     Status: Abnormal   Collection Time: 05/31/18  9:22 AM  Result Value Ref Range   Glucose-Capillary 32 (LL) 70 - 99 mg/dL   Comment 1 Notify RN   Comprehensive metabolic panel     Status: Abnormal   Collection Time: 05/31/18  9:34 AM  Result Value Ref Range   Sodium 141 135 - 145 mmol/L   Potassium 2.8 (L) 3.5 - 5.1 mmol/L   Chloride 107 98 - 111 mmol/L   CO2 26 22 - 32 mmol/L   Glucose, Bld 40 (LL) 70 - 99 mg/dL    Comment: CRITICAL RESULT CALLED TO, READ BACK BY AND VERIFIED WITH ASHLEY  ROSS ON 05/31/18 AT 1018 QSD    BUN 13 6 - 20 mg/dL   Creatinine, Ser 1.11 0.61 - 1.24 mg/dL   Calcium 9.4 8.9 - 10.3 mg/dL   Total Protein 7.6 6.5 - 8.1 g/dL   Albumin 4.3 3.5 - 5.0 g/dL   AST 26 15 - 41 U/L   ALT 12 0 - 44 U/L   Alkaline Phosphatase 94 38 - 126 U/L   Total Bilirubin 0.8 0.3 - 1.2 mg/dL   GFR calc non Af Amer >60 >60 mL/min   GFR calc Af Amer >60 >60 mL/min    Comment: (NOTE) The eGFR has been calculated using the CKD EPI equation. This calculation has not been validated in all clinical situations. eGFR's persistently <60 mL/min signify possible Chronic Kidney Disease.    Anion gap 8 5 - 15    Comment: Performed at [facility], [address] [place], [place] [address]  Ethanol     Status: Abnormal   Collection Time: 05/31/18  9:34 AM  Result Value Ref Range   Alcohol, Ethyl (B) 12 (H) <10 mg/dL    Comment: (NOTE) Lowest detectable limit for serum alcohol is 10 mg/dL. For medical purposes only. Performed at [facility], [address] [place], [place] [address]   Salicylate level     Status: None   Collection Time: 05/31/18  9:34 AM  Result Value Ref Range   Salicylate Lvl <1.1 2.8 - 30.0 mg/dL    Comment: Performed at [facility], [address] [place], [place] [address]  Acetaminophen level     Status: Abnormal    Collection Time: 05/31/18  9:34 AM  Result Value Ref Range   Acetaminophen (Tylenol), Serum <10 (L) 10 - 30 ug/mL    Comment: (NOTE) Therapeutic concentrations vary significantly. A range of 10-30 ug/mL  may be an effective concentration for many patients. However, some  are best treated at concentrations outside of this range. Acetaminophen concentrations >150 ug/mL at 4 hours after ingestion  and >50 ug/mL at 12 hours after ingestion are often associated with  toxic reactions. Performed at [facility], [address] [place], [place] [address]   cbc     Status: None   Collection Time: 05/31/18  9:34 AM  Result Value Ref Range   WBC 9.9 3.8 - 10.6 K/uL   RBC 4.89 4.40 - 5.90 MIL/uL   Hemoglobin 14.8 13.0 - 18.0 g/dL   HCT 42.4 40.0 - 52.0 %   MCV 86.7 80.0 - 100.0 fL   MCH 30.2 26.0 - 34.0 pg   MCHC 34.8 32.0 - 36.0 g/dL   RDW 13.2 11.5 - 14.5 %   Platelets 319 150 - 440 K/uL    Comment: Performed at [facility], [address] [place], [place] [address]  Magnesium     Status: None   Collection Time: 05/31/18  9:34 AM  Result Value Ref Range   Magnesium 1.9 1.7 - 2.4 mg/dL    Comment: Performed at [facility], [address] [place], [place] [address]  Glucose, capillary     Status: Abnormal   Collection Time: 05/31/18  9:46 AM  Result Value Ref Range   Glucose-Capillary 104 (H) 70 - 99 mg/dL   Comment 1 Notify RN   Glucose, capillary     Status: Abnormal   Collection Time: 05/31/18 10:15 AM  Result Value Ref Range   Glucose-Capillary 55 (L) 70 - 99 mg/dL  Comment 1 Notify RN   Glucose, capillary     Status: Abnormal   Collection Time: 05/31/18 10:45 AM  Result Value Ref Range   Glucose-Capillary 47 (L) 70 - 99 mg/dL   Comment 1 Notify RN   Glucose, capillary     Status: None   Collection Time: 05/31/18 11:15 AM  Result Value Ref Range   Glucose-Capillary 86 70 - 99 mg/dL   Comment 1 Notify RN   Glucose, capillary     Status:  Abnormal   Collection Time: 05/31/18 11:43 AM  Result Value Ref Range   Glucose-Capillary 58 (L) 70 - 99 mg/dL   Comment 1 Notify RN   Glucose, capillary     Status: Abnormal   Collection Time: 05/31/18 12:13 PM  Result Value Ref Range   Glucose-Capillary 50 (L) 70 - 99 mg/dL   Comment 1 Notify RN   Glucose, capillary     Status: Abnormal   Collection Time: 05/31/18 12:42 PM  Result Value Ref Range   Glucose-Capillary 44 (LL) 70 - 99 mg/dL   Comment 1 Notify RN   Glucose, capillary     Status: Abnormal   Collection Time: 05/31/18  1:14 PM  Result Value Ref Range   Glucose-Capillary 55 (L) 70 - 99 mg/dL   Comment 1 Notify RN   Glucose, capillary     Status: None   Collection Time: 05/31/18  1:45 PM  Result Value Ref Range   Glucose-Capillary 94 70 - 99 mg/dL   Comment 1 Notify RN   Glucose, capillary     Status: Abnormal   Collection Time: 05/31/18  2:15 PM  Result Value Ref Range   Glucose-Capillary 58 (L) 70 - 99 mg/dL   Comment 1 Notify RN   Glucose, capillary     Status: Abnormal   Collection Time: 05/31/18  2:50 PM  Result Value Ref Range   Glucose-Capillary 66 (L) 70 - 99 mg/dL  Glucose, capillary     Status: Abnormal   Collection Time: 05/31/18  3:15 PM  Result Value Ref Range   Glucose-Capillary 61 (L) 70 - 99 mg/dL   Comment 1 Notify RN   Glucose, capillary     Status: None   Collection Time: 05/31/18  3:47 PM  Result Value Ref Range   Glucose-Capillary 70 70 - 99 mg/dL   Comment 1 Notify RN   MRSA PCR Screening     Status: None   Collection Time: 05/31/18  4:55 PM  Result Value Ref Range   MRSA by PCR NEGATIVE NEGATIVE    Comment:        The GeneXpert MRSA Assay (FDA approved for NASAL specimens only), is one component of a comprehensive MRSA colonization surveillance program. It is not intended to diagnose MRSA infection nor to guide or monitor treatment for MRSA infections. Performed at [facility], [address] [place], [place]  [address]   Glucose, capillary     Status: None   Collection Time: 05/31/18  4:56 PM  Result Value Ref Range   Glucose-Capillary 88 70 - 99 mg/dL  Glucose, capillary     Status: Abnormal   Collection Time: 05/31/18  7:43 PM  Result Value Ref Range   Glucose-Capillary 165 (H) 70 - 99 mg/dL  Urine Drug Screen, Qualitative     Status: Abnormal   Collection Time: 05/31/18  8:03 PM  Result Value Ref Range   Tricyclic, Ur Screen NONE DETECTED NONE DETECTED   Amphetamines, Ur Screen  NONE DETECTED NONE DETECTED   MDMA (Ecstasy)Ur Screen NONE DETECTED NONE DETECTED   Cocaine Metabolite,Ur [place] NONE DETECTED NONE DETECTED   Opiate, Ur Screen NONE DETECTED NONE DETECTED   Phencyclidine (PCP) Ur S NONE DETECTED NONE DETECTED   Cannabinoid 50 Ng, Ur [place] NONE DETECTED NONE DETECTED   Barbiturates, Ur Screen NONE DETECTED NONE DETECTED   Benzodiazepine, Ur Scrn TEST NOT PERFORMED, REAGENT NOT AVAILABLE (A) NONE DETECTED   Methadone Scn, Ur NONE DETECTED NONE DETECTED    Comment: (NOTE) Tricyclics + metabolites, urine    Cutoff 1000 ng/mL Amphetamines + metabolites, urine  Cutoff 1000 ng/mL MDMA (Ecstasy), urine              Cutoff 500 ng/mL Cocaine Metabolite, urine          Cutoff 300 ng/mL Opiate + metabolites, urine        Cutoff 300 ng/mL Phencyclidine (PCP), urine         Cutoff 25 ng/mL Cannabinoid, urine                 Cutoff 50 ng/mL Barbiturates + metabolites, urine  Cutoff 200 ng/mL Benzodiazepine, urine              Cutoff 200 ng/mL Methadone, urine                   Cutoff 300 ng/mL The urine drug screen provides only a preliminary, unconfirmed analytical test result and should not be used for non-medical purposes. Clinical consideration and professional judgment should be applied to any positive drug screen result due to possible interfering substances. A more specific alternate chemical method must be used in order to obtain a confirmed analytical result. Gas chromatography / mass  spectrometry (GC/MS) is the preferred confirmat ory method. Performed at [facility], [address] [place], [place] [address]   Glucose, capillary     Status: Abnormal   Collection Time: 05/31/18  9:35 PM  Result Value Ref Range   Glucose-Capillary 232 (H) 70 - 99 mg/dL  Glucose, capillary     Status: Abnormal   Collection Time: 05/31/18 11:07 PM  Result Value Ref Range   Glucose-Capillary 265 (H) 70 - 99 mg/dL  Glucose, capillary     Status: Abnormal   Collection Time: 06/01/18  1:17 AM  Result Value Ref Range   Glucose-Capillary 458 (H) 70 - 99 mg/dL  Glucose, capillary     Status: Abnormal   Collection Time: 06/01/18  3:25 AM  Result Value Ref Range   Glucose-Capillary 357 (H) 70 - 99 mg/dL  Basic metabolic panel     Status: Abnormal   Collection Time: 06/01/18  4:31 AM  Result Value Ref Range   Sodium 135 135 - 145 mmol/L   Potassium 4.8 3.5 - 5.1 mmol/L   Chloride 104 98 - 111 mmol/L   CO2 25 22 - 32 mmol/L   Glucose, Bld 373 (H) 70 - 99 mg/dL   BUN 12 6 - 20 mg/dL   Creatinine, Ser 1.00 0.61 - 1.24 mg/dL   Calcium 8.8 (L) 8.9 - 10.3 mg/dL   GFR calc non Af Amer >60 >60 mL/min   GFR calc Af Amer >60 >60 mL/min    Comment: (NOTE) The eGFR has been calculated using the CKD EPI equation. This calculation has not been validated in all clinical situations. eGFR's persistently <60 mL/min signify possible Chronic Kidney Disease.    Anion gap 6 5 - 15  Comment: Performed at [facility], [address] [place], [place] [address]  CBC     Status: Abnormal   Collection Time: 06/01/18  4:31 AM  Result Value Ref Range   WBC 7.3 3.8 - 10.6 K/uL   RBC 4.53 4.40 - 5.90 MIL/uL   Hemoglobin 13.8 13.0 - 18.0 g/dL   HCT 39.8 (L) 40.0 - 52.0 %   MCV 87.8 80.0 - 100.0 fL   MCH 30.5 26.0 - 34.0 pg   MCHC 34.7 32.0 - 36.0 g/dL   RDW 13.3 11.5 - 14.5 %   Platelets 253 150 - 440 K/uL    Comment: Performed at [facility], [address]  [place], [place] [address]  Glucose, capillary     Status: Abnormal   Collection Time: 06/01/18  5:43 AM  Result Value Ref Range   Glucose-Capillary 363 (H) 70 - 99 mg/dL  Glucose, capillary     Status: Abnormal   Collection Time: 06/01/18  7:35 AM  Result Value Ref Range   Glucose-Capillary 317 (H) 70 - 99 mg/dL  Glucose, capillary     Status: Abnormal   Collection Time: 06/01/18  9:37 AM  Result Value Ref Range   Glucose-Capillary 420 (H) 70 - 99 mg/dL  Glucose, capillary     Status: Abnormal   Collection Time: 06/01/18 11:40 AM  Result Value Ref Range   Glucose-Capillary 237 (H) 70 - 99 mg/dL    Current Facility-Administered Medications  Medication Dose Route Frequency Provider Last Rate Last Dose  . acetaminophen (TYLENOL) tablet 650 mg  650 mg Oral Q6H PRN Henreitta Leber, MD       Or  . acetaminophen (TYLENOL) suppository 650 mg  650 mg Rectal Q6H PRN Henreitta Leber, MD      . divalproex (DEPAKOTE) DR tablet 500 mg  500 mg Oral Q12H [name] Mann, MD      . enoxaparin (LOVENOX) injection 40 mg  40 mg Subcutaneous Q24H Henreitta Leber, MD   40 mg at 05/31/18 2129  . escitalopram (LEXAPRO) tablet 10 mg  10 mg Oral Daily [name] Mann, MD   10 mg at 06/01/18 0856  . gabapentin (NEURONTIN) capsule 600 mg  600 mg Oral TID Henreitta Leber, MD   600 mg at 06/01/18 0856  . hydrOXYzine (ATARAX/VISTARIL) tablet 50 mg  50 mg Oral Q6H PRN Henreitta Leber, MD      . insulin aspart (novoLOG) injection 0-15 Units  0-15 Units Subcutaneous TID WC Gouru, Aruna, MD      . insulin aspart (novoLOG) injection 0-5 Units  0-5 Units Subcutaneous QHS Gouru, Aruna, MD      . insulin aspart (novoLOG) injection 15 Units  15 Units Subcutaneous TID WC Gouru, Aruna, MD      . insulin glargine (LANTUS) injection 25 Units  25 Units Subcutaneous QHS Gouru, Aruna, MD      . ondansetron (ZOFRAN) tablet 4 mg  4 mg Oral Q6H PRN Henreitta Leber, MD       Or  . ondansetron (ZOFRAN) injection 4 mg  4 mg  Intravenous Q6H PRN Sainani, Belia Heman, MD      . potassium chloride SA (K-DUR,KLOR-CON) CR tablet 20 mEq  20 mEq Oral BID Henreitta Leber, MD   20 mEq at 06/01/18 0856  . pregabalin (LYRICA) capsule 150 mg  150 mg Oral BID Henreitta Leber, MD   150 mg at 06/01/18 6433    Musculoskeletal:  Strength & Muscle Tone: within normal limits Gait & Station: normal Patient leans: N/A  Psychiatric Specialty Exam: Physical Exam  Nursing note and vitals reviewed.   ROS : See H+P  Blood pressure 120/78, pulse 71, temperature 98.1 F (36.7 C), temperature source Oral, resp. rate 18, height 6' 2" (1.88 m), weight 113.4 kg, SpO2 97 %.Body mass index is 32.1 kg/m.  General Appearance: Casual  Eye Contact:  Fair  Speech:  Loud and demanding at times  Volume:  Normal  Mood:  Depressed  Affect:  Irritable and argumentative  Thought Process:  Coherent, Goal Directed and Linear  Orientation:  Full (Time, Place, and Person)  Thought Content:  Suicidal thoughts and threats  Suicidal Thoughts:  Yes.  with intent/plan  Homicidal Thoughts:  No  Memory:  Immediate;   Good  Judgement:  Impaired  Insight:  Lacking  Psychomotor Activity:  Normal  Concentration:  Concentration: Good and Attention Span: Good  Recall:  Good  Fund of Knowledge:  Good  Language:  Good  Akathisia:  No  Handed:  Right  AIMS (if indicated):     Assets:  Communication Skills  ADL's:  Intact  Cognition:  WNL  Sleep:        Treatment Plan Summary:  Mark [name] is a 32 year old Caucasian male with history of personality disorder as well as recurrent depression who was admitted to the medicine service after taking supposedly 120 units of insulin in a suicide attempt.  At this time, he will remain under involuntary commitment until he is no longer endorsing suicidal thoughts.  The patient remains insistent that if he is discharged he will kill himself.  He can be extremely manipulative and passive-aggressive with physicians in  the hospital.  Mood Disorder, NOS, personality disorder: We will start Depakote 500 mg p.o. twice daily for mood stabilization and continue on lispro 10 mg p.o. daily for anxiety and depression.  He will continue with a one-to-one sitter and he will remain under involuntary commitment.  At this time, the patient will remain under involuntary commitment with a one-to-one sitter secondary to adamantly endorsing suicidal intent in the hospital.  Will reassess on a daily basis.  He will most likely be able to be discharged from the medicine service as he does not greatly benefit from inpatient psychiatric hospitalization.     Daily contact with patient to assess and evaluate symptoms and progress in treatment and Medication management   [name] Mann, MD 06/01/2018 11:56 AM

## 2018-06-01 NOTE — Progress Notes (Signed)
Chaplain concurs with Dr. Shelda AltesKapur's assessment of patient, including manipulation and threats of self-harm when the patient does not get what he desires. Adding to the patient's narrative, he is experiencing grief/sorrow over the loss of his mother and estrangement from his 3 sisters. Patient has participated in multiple homeless programs, getting banned from several, including [employer]. He expressed a history of aggressive confrontations. Patient is seeking transportation to the [facility] in [place], [place]. It is unclear whether they will accept him due to past behaviors. His medical issues have limited his employment opportunities and he wants to file for SSDI. Patient believes this is possible in [place]. Further, the patient is seeking resources that will lead to shelter and a way to get "out of this funk." Patient reports that he is in a cycle where everything falls apart about every 6 mos. Spiritually, the patient alternates between anger at God and "understanding" God. Patient believes that God will condemn him to hell if he commits suicide, yet says he's "not scared of hell." Patient would benefit from a Social Work consult to determine possible resources and placement after discharge. Chaplain provided active listening, challenging reflections, and encouragement to advocate for himself. Chaplain concluded the encounter with a blessings.

## 2018-06-01 NOTE — Progress Notes (Signed)
Pt complaining of not being able to sleep and FSBS are increasing throughout night. Primary nurse paged and spoke to Dr. Anne HahnWillis. Orders received to decrease fluids to 50 ml an hour along with seroquel 50 mg. Primary nurse to continue to monitor.

## 2018-06-01 NOTE — Progress Notes (Signed)
[facility] [place] [facility]   PATIENT NAME: Mark CoopMichael [name]    MR#:  [id number]  DATE OF BIRTH:  05/07/1986  SUBJECTIVE:  CHIEF COMPLAINT: Patient hypoglycemia improved.  Still reporting suicidal ideation.  No other complaints  REVIEW OF SYSTEMS:  CONSTITUTIONAL: No fever, fatigue or weakness.  EYES: No blurred or double vision.  EARS, NOSE, AND THROAT: No tinnitus or ear pain.  RESPIRATORY: No cough, shortness of breath, wheezing or hemoptysis.  CARDIOVASCULAR: No chest pain, orthopnea, edema.  GASTROINTESTINAL: No nausea, vomiting, diarrhea or abdominal pain.  GENITOURINARY: No dysuria, hematuria.  ENDOCRINE: No polyuria, nocturia,  HEMATOLOGY: No anemia, easy bruising or bleeding SKIN: No rash or lesion. MUSCULOSKELETAL: No joint pain or arthritis.   NEUROLOGIC: No tingling, numbness, weakness.  PSYCHIATRY: Reports suicidal ideation  DRUG ALLERGIES:   Allergies  Allergen Reactions  . Mushroom Extract Complex Hives, Itching and Nausea And Vomiting  . Penicillins Anaphylaxis, Hives and Swelling    Has patient had a PCN reaction causing immediate rash, facial/tongue/throat swelling, SOB or lightheadedness with hypotension: Yes Has patient had a PCN reaction causing severe rash involving mucus membranes or skin necrosis: No Has patient had a PCN reaction that required hospitalization Yes Has patient had a PCN reaction occurring within the last 10 years: Yes If all of the above answers are "NO", then may proceed with Cephalosporin use.  . Sulfa Antibiotics Anaphylaxis and Hives  . Clindamycin/Lincomycin Hives    VITALS:  Blood pressure 126/72, pulse 70, temperature 98.2 F (36.8 C), temperature source Oral, resp. rate 18, height 6\' 2"  (1.88 m), weight 113.4 kg, SpO2 94 %.  PHYSICAL EXAMINATION:  GENERAL:  32 y.o.-year-old patient lying in the bed with no acute distress.  EYES: Pupils equal, round, reactive to light and accommodation. No  scleral icterus. Extraocular muscles intact.  HEENT: Head atraumatic, normocephalic. Oropharynx and nasopharynx clear.  NECK:  Supple, no jugular venous distention. No thyroid enlargement, no tenderness.  LUNGS: Normal breath sounds bilaterally, no wheezing, rales,rhonchi or crepitation. No use of accessory muscles of respiration.  CARDIOVASCULAR: S1, S2 normal. No murmurs, rubs, or gallops.  ABDOMEN: Soft, nontender, nondistended. Bowel sounds present.  EXTREMITIES: No pedal edema, cyanosis, or clubbing.  NEUROLOGIC: Cranial nerves II through XII are intact. Sensation intact. Gait not checked.  PSYCHIATRIC: The patient is alert and oriented x 3.  SKIN: No obvious rash, lesion, or ulcer.    LABORATORY PANEL:   CBC Recent Labs  Lab 06/01/18 0431  WBC 7.3  HGB 13.8  HCT 39.8*  PLT 253   ------------------------------------------------------------------------------------------------------------------  Chemistries  Recent Labs  Lab 05/31/18 0934 06/01/18 0431  NA 141 135  K 2.8* 4.8  CL 107 104  CO2 26 25  GLUCOSE 40* 373*  BUN 13 12  CREATININE 1.11 1.00  CALCIUM 9.4 8.8*  MG 1.9  --   AST 26  --   ALT 12  --   ALKPHOS 94  --   BILITOT 0.8  --    ------------------------------------------------------------------------------------------------------------------  Cardiac Enzymes No results for input(s): TROPONINI in the last 168 hours. ------------------------------------------------------------------------------------------------------------------  RADIOLOGY:  No results found.  EKG:   Orders placed or performed during the hospital encounter of 03/04/18  . ED EKG  . ED EKG  . EKG 12-Lead  . EKG 12-Lead  . EKG    ASSESSMENT AND PLAN:    32 year old male with past medical history of diabetes, depression, previous history of suicide attempts due to increasing insulin intake,  neuropathy due to diabetes who presents to the hospital due to suicidal ideations and  taking 120 units of regular insulin this morning.  1.  Hypoglycemia-secondary to increase intake of insulin due to suicide attempt. -Clinically improving.  Discontinued dextrose drip -Insulin sliding scale and resume Lantus at a lower dose -Monitor sugars closely   2.  Depression/suicide attempt- patient has done this in the past.  He had a recent admission in July for similar complaints and reasons.  Patient admits to suicidal ideations.  He does not see a psychiatrist. -Patient is currently remains IVC -Seen by psychiatrist -Patient was started on Depakote 500 mg twice a day for mood stabilization and plan is to continue on Lexapro 10 mg once daily for anxiety and depression -Psych will follow the patient on daily basis  3.  Hypokalemia and hypomagnesemia Repleted.  Potassium at 4.8 and magnesium at 1.9  4.  Diabetic neuropathy-continue gabapentin, Lyrica     All the records are reviewed and case discussed with Care Management/Social Workerr. Management plans discussed with the patient, family and they are in agreement.  CODE STATUS: fc   TOTAL TIME TAKING CARE OF THIS PATIENT: 34  minutes.   POSSIBLE D/C IN  1-2 DAYS, DEPENDING ON CLINICAL CONDITION.  Note: This dictation was prepared with Dragon dictation along with smaller phrase technology. Any transcriptional errors that result from this process are unintentional.   Ramonita Lab M.D on 06/01/2018 at 9:21 PM  Between 7am to 6pm - Pager - [phone] After 6pm go to [email or web address] - password [email or web address]  [facility]  Office  [phone]  CC: Primary care physician; Patient, No Pcp Per

## 2018-06-02 ENCOUNTER — Encounter: Payer: Self-pay | Admitting: Psychiatry

## 2018-06-02 LAB — GLUCOSE, CAPILLARY
GLUCOSE-CAPILLARY: 27 mg/dL — AB (ref 70–99)
GLUCOSE-CAPILLARY: 370 mg/dL — AB (ref 70–99)
GLUCOSE-CAPILLARY: 32 mg/dL — AB (ref 70–99)
GLUCOSE-CAPILLARY: 88 mg/dL (ref 70–99)
GLUCOSE-CAPILLARY: 165 mg/dL — AB (ref 70–99)
GLUCOSE-CAPILLARY: 245 mg/dL — AB (ref 70–99)
Glucose-Capillary: 351 mg/dL — ABNORMAL HIGH (ref 70–99)
Glucose-Capillary: 320 mg/dL — ABNORMAL HIGH (ref 70–99)

## 2018-06-02 MED ORDER — INSULIN GLARGINE 100 UNIT/ML ~~LOC~~ SOLN
25.0000 [IU] | Freq: Every day | SUBCUTANEOUS | Status: DC
  Administered 2018-06-02: 25 [IU] via SUBCUTANEOUS
  Filled 2018-06-02 (×2): qty 0.25

## 2018-06-02 MED ORDER — INSULIN ASPART 100 UNIT/ML ~~LOC~~ SOLN
10.0000 [IU] | Freq: Three times a day (TID) | SUBCUTANEOUS | Status: DC
  Administered 2018-06-02: 10 [IU] via SUBCUTANEOUS
  Filled 2018-06-02: qty 1

## 2018-06-02 MED ORDER — INSULIN GLARGINE 100 UNIT/ML ~~LOC~~ SOLN
12.0000 [IU] | Freq: Every day | SUBCUTANEOUS | Status: DC
Start: 2018-06-03 — End: 2018-06-03
  Filled 2018-06-02: qty 0.12

## 2018-06-02 NOTE — Progress Notes (Signed)
[facility] [employer] [facility]   PATIENT NAME: Mark CoopMichael [name]    MR#:  [id number]  DATE OF BIRTH:  03/04/1986  SUBJECTIVE:  CHIEF COMPLAINT: Patient refused lunch and became hypoglycemic with Accu-Chek 30, still reporting suicidal ideation.   REVIEW OF SYSTEMS:  CONSTITUTIONAL: No fever, fatigue or weakness.  EYES: No blurred or double vision.  EARS, NOSE, AND THROAT: No tinnitus or ear pain.  RESPIRATORY: No cough, shortness of breath, wheezing or hemoptysis.  CARDIOVASCULAR: No chest pain, orthopnea, edema.  GASTROINTESTINAL: No nausea, vomiting, diarrhea or abdominal pain.  GENITOURINARY: No dysuria, hematuria.  ENDOCRINE: No polyuria, nocturia,  HEMATOLOGY: No anemia, easy bruising or bleeding SKIN: No rash or lesion. MUSCULOSKELETAL: No joint pain or arthritis.   NEUROLOGIC: No tingling, numbness, weakness.  PSYCHIATRY: Reports suicidal ideation  DRUG ALLERGIES:   Allergies  Allergen Reactions  . Mushroom Extract Complex Hives, Itching and Nausea And Vomiting  . Penicillins Anaphylaxis, Hives and Swelling    Has patient had a PCN reaction causing immediate rash, facial/tongue/throat swelling, SOB or lightheadedness with hypotension: Yes Has patient had a PCN reaction causing severe rash involving mucus membranes or skin necrosis: No Has patient had a PCN reaction that required hospitalization Yes Has patient had a PCN reaction occurring within the last 10 years: Yes If all of the above answers are "NO", then may proceed with Cephalosporin use.  . Sulfa Antibiotics Anaphylaxis and Hives  . Clindamycin/Lincomycin Hives    VITALS:  Blood pressure 124/84, pulse 78, temperature 98.4 F (36.9 C), temperature source Oral, resp. rate 18, height 6\' 2"  (1.88 m), weight 113.4 kg, SpO2 94 %.  PHYSICAL EXAMINATION:  GENERAL:  32 y.o.-year-old patient lying in the bed with no acute distress.  EYES: Pupils equal, round, reactive to light and  accommodation. No scleral icterus. Extraocular muscles intact.  HEENT: Head atraumatic, normocephalic. Oropharynx and nasopharynx clear.  NECK:  Supple, no jugular venous distention. No thyroid enlargement, no tenderness.  LUNGS: Normal breath sounds bilaterally, no wheezing, rales,rhonchi or crepitation. No use of accessory muscles of respiration.  CARDIOVASCULAR: S1, S2 normal. No murmurs, rubs, or gallops.  ABDOMEN: Soft, nontender, nondistended. Bowel sounds present.  EXTREMITIES: No pedal edema, cyanosis, or clubbing.  NEUROLOGIC: Cranial nerves II through XII are intact. Sensation intact. Gait not checked.  PSYCHIATRIC: The patient is alert and oriented x 3.  SKIN: No obvious rash, lesion, or ulcer.    LABORATORY PANEL:   CBC Recent Labs  Lab 06/01/18 0431  WBC 7.3  HGB 13.8  HCT 39.8*  PLT 253   ------------------------------------------------------------------------------------------------------------------  Chemistries  Recent Labs  Lab 05/31/18 0934 06/01/18 0431  NA 141 135  K 2.8* 4.8  CL 107 104  CO2 26 25  GLUCOSE 40* 373*  BUN 13 12  CREATININE 1.11 1.00  CALCIUM 9.4 8.8*  MG 1.9  --   AST 26  --   ALT 12  --   ALKPHOS 94  --   BILITOT 0.8  --    ------------------------------------------------------------------------------------------------------------------  Cardiac Enzymes No results for input(s): TROPONINI in the last 168 hours. ------------------------------------------------------------------------------------------------------------------  RADIOLOGY:  No results found.  EKG:   Orders placed or performed during the hospital encounter of 03/04/18  . ED EKG  . ED EKG  . EKG 12-Lead  . EKG 12-Lead  . EKG    ASSESSMENT AND PLAN:    32 year old male with past medical history of diabetes, depression, previous history of suicide attempts due to increasing  insulin intake, neuropathy due to diabetes who presents to the hospital due to  suicidal ideations and taking 120 units of regular insulin this morning.  1.  Hypoglycemia-secondary to increase intake of insulin due to suicide attempt. -The patient became hypoglycemic as refusing to eat, received Lantus 25 units today morning and did not eat lunch, Lantus dose reduced to 12 units -Will resume D5 drip if needed -Insulin sliding scale and Lantus at a lower dose -Monitor sugars closely   2.  Depression/suicide attempt- patient has done this in the past.  He had a recent admission in July for similar complaints and reasons.  Patient admits to suicidal ideations.  He does not see a psychiatrist. -Patient is currently remains IVC -Seen by psychiatrist, patient will be seen by Dr. Jennet MaduroPucilowska today -Patient was started on Depakote 500 mg twice a day for mood stabilization and plan is to continue on Lexapro 10 mg once daily for anxiety and depression -Psych will follow the patient on daily basis  3.  Hypokalemia and hypomagnesemia Repleted.  Potassium at 4.8 and magnesium at 1.9  4.  Diabetic neuropathy-continue gabapentin, Lyrica     All the records are reviewed and case discussed with Care Management/Social Workerr. Management plans discussed with the patient, family and they are in agreement.  CODE STATUS: fc   TOTAL TIME TAKING CARE OF THIS PATIENT: 34  minutes.   POSSIBLE D/C IN  1-2 DAYS, DEPENDING ON CLINICAL CONDITION.  Note: This dictation was prepared with Dragon dictation along with smaller phrase technology. Any transcriptional errors that result from this process are unintentional.   Ramonita LabAruna Ally Knodel M.D on 06/02/2018 at 3:57 PM  Between 7am to 6pm - Pager - [phone] After 6pm go to [email or web address] - password [email or web address]  [facility] [place] [facility]  Office  [phone]  CC: Primary care physician; Patient, No Pcp Per

## 2018-06-02 NOTE — Progress Notes (Signed)
Chaplain followed up on 06/01/2018 visit to convey that nurse Zollie Scalelivia had placed consult orders for social work and case management. Patient was somewhat indifferent about this news, but he does understand the importance of these conversations. Following, the patient conveyed that his desire to commit suicide was still present. Chaplain acknowledged this statement. Chaplain reiterated how anger and aggression would not help his petition for support assistance and shelter. Patient agreed. Chaplain offered emotional support, pastoral presence, and active listening.

## 2018-06-02 NOTE — Progress Notes (Signed)
[facility] MD Progress Note  06/02/2018 5:30 PM Mark [name]  MRN:  [id number]  Subjective:    This is a follow up on a patient admitted to medicine after overdose on insulin who still claims to be suicidal. His suicidal ideation is always conditional; as he has no place to go. Upon his request, I contacted Homeless shelter at [facility] in [place]. He was a client there in July and could return there. His reentry day is Wednesday. I also attempted to contact Renee Logett at the shelter to have this patient considered for 7-day mental health bed. She will not be available until tomorrow.  This patient does not benefity from psychiatric admission. Dr. Nicolasa Ducking started Depakote over the weekend but this patient does not benefit from medications either as he is noncompliant, uninsured and has no resources. He needs assist ence with disability application. This could be done at the shelter.   Principal Problem: Major depressive disorder, recurrent severe without psychotic features ([place]) Diagnosis:   Patient Active Problem List   Diagnosis Date Noted  . Major depressive disorder, recurrent severe without psychotic features ([place]) [F33.2]     Priority: High  . Severe recurrent major depression with psychotic features ([place]) [F33.3] 08/19/2017    Priority: High  . Diabetes ([place]) [E11.9] 09/11/2017    Priority: Medium  . Suicidal ideation [R45.851]   . Adjustment disorder with depressed mood [F43.21] 12/07/2017  . Noncompliance [Z91.19] 10/17/2017  . Dysthymia [F34.1] 10/08/2017  . Personality disorder ([place]) [F60.9] 09/26/2017  . 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 Obstructive hydrocephalus [G91.1] 09/19/2017  . Hypoglycemia [E16.2] 09/09/2017  . MDD (major depressive disorder) [F32.9] 08/22/2017  . Nausea & vomiting [R11.2] 08/18/2017  . Abdominal pain [R10.9] 08/18/2017  . Hyponatremia [E87.1] 08/18/2017  . DKA (diabetic ketoacidoses) ([place]) [E13.10] 11/24/2015  . Diabetic keto-acidosis  ([place]) [E13.10] 10/23/2015   Total Time spent with patient: 30 minutes  Past Psychiatric History: depression  Past Medical History:  Past Medical History:  Diagnosis Date  . Depression   . Diabetes mellitus without complication ([place])   . Hydrocephalus   . Kidney stones     Past Surgical History:  Procedure Laterality Date  . KIDNEY STONE SURGERY     Family History:  Family History  Problem Relation Age of Onset  . Breast cancer Mother    Family Psychiatric  History: depression Social History:  Social History   Substance and Sexual Activity  Alcohol Use Never  . Frequency: Never   Comment: occassional      Social History   Substance and Sexual Activity  Drug Use Yes  . Types: Marijuana   Comment: smoked 2-3 days ago    Social History   Socioeconomic History  . Marital status: Single    Spouse name: Not on file  . Number of children: Not on file  . Years of education: Not on file  . Highest education level: Not on file  Occupational History  . Not on file  Social Needs  . Financial resource strain: Not on file  . Food insecurity:    Worry: Not on file    Inability: Not on file  . Transportation needs:    Medical: Not on file    Non-medical: Not on file  Tobacco Use  . Smoking status: Current Every Day Smoker    Packs/day: 0.75    Years: 20.00    Pack years: 15.00    Types: Cigarettes  . Smokeless tobacco: Never Used  Substance  and Sexual Activity  . Alcohol use: Never    Frequency: Never    Comment: occassional   . Drug use: Yes    Types: Marijuana    Comment: smoked 2-3 days ago  . Sexual activity: Not on file  Lifestyle  . Physical activity:    Days per week: Not on file    Minutes per session: Not on file  . Stress: Not on file  Relationships  . Social connections:    Talks on phone: Not on file    Gets together: Not on file    Attends religious service: Not on file    Active member of club or organization: Not on file    Attends  meetings of clubs or organizations: Not on file    Relationship status: Not on file  Other Topics Concern  . Not on file  Social History Narrative  . Not on file   Additional Social History:                         Sleep: Fair  Appetite:  Fair  Current Medications: Current Facility-Administered Medications  Medication Dose Route Frequency Provider Last Rate Last Dose  . acetaminophen (TYLENOL) tablet 650 mg  650 mg Oral Q6H PRN Henreitta Leber, MD       Or  . acetaminophen (TYLENOL) suppository 650 mg  650 mg Rectal Q6H PRN Henreitta Leber, MD      . divalproex (DEPAKOTE) DR tablet 500 mg  500 mg Oral Q12H [name] Mann, MD   500 mg at 06/02/18 1052  . enoxaparin (LOVENOX) injection 40 mg  40 mg Subcutaneous Q24H Henreitta Leber, MD   40 mg at 06/01/18 2030  . escitalopram (LEXAPRO) tablet 10 mg  10 mg Oral Daily [name] Mann, MD   10 mg at 06/02/18 1051  . gabapentin (NEURONTIN) capsule 600 mg  600 mg Oral TID Henreitta Leber, MD   600 mg at 06/02/18 1052  . hydrOXYzine (ATARAX/VISTARIL) tablet 50 mg  50 mg Oral Q6H PRN Henreitta Leber, MD      . insulin aspart (novoLOG) injection 0-15 Units  0-15 Units Subcutaneous TID WC Nicholes Mango, MD   15 Units at 06/02/18 1225  . insulin aspart (novoLOG) injection 0-5 Units  0-5 Units Subcutaneous QHS Gouru, Aruna, MD      . insulin aspart (novoLOG) injection 10 Units  10 Units Subcutaneous TID WC Nicholes Mango, MD   Stopped at 06/02/18 1314  . [START ON 06/03/2018] insulin glargine (LANTUS) injection 12 Units  12 Units Subcutaneous Daily Gouru, Aruna, MD      . ondansetron (ZOFRAN) tablet 4 mg  4 mg Oral Q6H PRN Henreitta Leber, MD       Or  . ondansetron (ZOFRAN) injection 4 mg  4 mg Intravenous Q6H PRN Henreitta Leber, MD      . QUEtiapine (SEROQUEL) tablet 50 mg  50 mg Oral Corwin Levins, MD   50 mg at 06/01/18 2244    Lab Results:  Results for orders placed or performed during the hospital encounter of 05/31/18  (from the past 48 hour(s))  Glucose, capillary     Status: Abnormal   Collection Time: 05/31/18  7:43 PM  Result Value Ref Range   Glucose-Capillary 165 (H) 70 - 99 mg/dL  Urine Drug Screen, Qualitative     Status: Abnormal   Collection Time: 05/31/18  8:03 PM  Result  Value Ref Range   Tricyclic, Ur Screen NONE DETECTED NONE DETECTED   Amphetamines, Ur Screen NONE DETECTED NONE DETECTED   MDMA (Ecstasy)Ur Screen NONE DETECTED NONE DETECTED   Cocaine Metabolite,Ur [place] NONE DETECTED NONE DETECTED   Opiate, Ur Screen NONE DETECTED NONE DETECTED   Phencyclidine (PCP) Ur S NONE DETECTED NONE DETECTED   Cannabinoid 50 Ng, Ur [place] NONE DETECTED NONE DETECTED   Barbiturates, Ur Screen NONE DETECTED NONE DETECTED   Benzodiazepine, Ur Scrn TEST NOT PERFORMED, REAGENT NOT AVAILABLE (A) NONE DETECTED   Methadone Scn, Ur NONE DETECTED NONE DETECTED    Comment: (NOTE) Tricyclics + metabolites, urine    Cutoff 1000 ng/mL Amphetamines + metabolites, urine  Cutoff 1000 ng/mL MDMA (Ecstasy), urine              Cutoff 500 ng/mL Cocaine Metabolite, urine          Cutoff 300 ng/mL Opiate + metabolites, urine        Cutoff 300 ng/mL Phencyclidine (PCP), urine         Cutoff 25 ng/mL Cannabinoid, urine                 Cutoff 50 ng/mL Barbiturates + metabolites, urine  Cutoff 200 ng/mL Benzodiazepine, urine              Cutoff 200 ng/mL Methadone, urine                   Cutoff 300 ng/mL The urine drug screen provides only a preliminary, unconfirmed analytical test result and should not be used for non-medical purposes. Clinical consideration and professional judgment should be applied to any positive drug screen result due to possible interfering substances. A more specific alternate chemical method must be used in order to obtain a confirmed analytical result. Gas chromatography / mass spectrometry (GC/MS) is the preferred confirmat ory method. Performed at [facility], [address]  [place],  [address]   Glucose, capillary     Status: Abnormal   Collection Time: 05/31/18  9:35 PM  Result Value Ref Range   Glucose-Capillary 232 (H) 70 - 99 mg/dL  Glucose, capillary     Status: Abnormal   Collection Time: 05/31/18 11:07 PM  Result Value Ref Range   Glucose-Capillary 265 (H) 70 - 99 mg/dL  Glucose, capillary     Status: Abnormal   Collection Time: 06/01/18  1:17 AM  Result Value Ref Range   Glucose-Capillary 458 (H) 70 - 99 mg/dL  Glucose, capillary     Status: Abnormal   Collection Time: 06/01/18  3:25 AM  Result Value Ref Range   Glucose-Capillary 357 (H) 70 - 99 mg/dL  Basic metabolic panel     Status: Abnormal   Collection Time: 06/01/18  4:31 AM  Result Value Ref Range   Sodium 135 135 - 145 mmol/L   Potassium 4.8 3.5 - 5.1 mmol/L   Chloride 104 98 - 111 mmol/L   CO2 25 22 - 32 mmol/L   Glucose, Bld 373 (H) 70 - 99 mg/dL   BUN 12 6 - 20 mg/dL   Creatinine, Ser 1.00 0.61 - 1.24 mg/dL   Calcium 8.8 (L) 8.9 - 10.3 mg/dL   GFR calc non Af Amer >60 >60 mL/min   GFR calc Af Amer >60 >60 mL/min    Comment: (NOTE) The eGFR has been calculated using the CKD EPI equation. This calculation has not been validated in all clinical situations. eGFR's persistently <60 mL/min  signify possible Chronic Kidney Disease.    Anion gap 6 5 - 15    Comment: Performed at [facility], [address] [place], [place] [address]  CBC     Status: Abnormal   Collection Time: 06/01/18  4:31 AM  Result Value Ref Range   WBC 7.3 3.8 - 10.6 K/uL   RBC 4.53 4.40 - 5.90 MIL/uL   Hemoglobin 13.8 13.0 - 18.0 g/dL   HCT 39.8 (L) 40.0 - 52.0 %   MCV 87.8 80.0 - 100.0 fL   MCH 30.5 26.0 - 34.0 pg   MCHC 34.7 32.0 - 36.0 g/dL   RDW 13.3 11.5 - 14.5 %   Platelets 253 150 - 440 K/uL    Comment: Performed at [facility], [address] [place], [place] [address]  Glucose, capillary     Status: Abnormal   Collection Time: 06/01/18  5:43 AM  Result Value Ref  Range   Glucose-Capillary 363 (H) 70 - 99 mg/dL  Glucose, capillary     Status: Abnormal   Collection Time: 06/01/18  7:35 AM  Result Value Ref Range   Glucose-Capillary 317 (H) 70 - 99 mg/dL  Glucose, capillary     Status: Abnormal   Collection Time: 06/01/18  9:37 AM  Result Value Ref Range   Glucose-Capillary 420 (H) 70 - 99 mg/dL  Glucose, capillary     Status: Abnormal   Collection Time: 06/01/18 11:40 AM  Result Value Ref Range   Glucose-Capillary 237 (H) 70 - 99 mg/dL  Glucose, capillary     Status: None   Collection Time: 06/01/18  1:46 PM  Result Value Ref Range   Glucose-Capillary 87 70 - 99 mg/dL  Glucose, capillary     Status: Abnormal   Collection Time: 06/01/18  3:51 PM  Result Value Ref Range   Glucose-Capillary 48 (L) 70 - 99 mg/dL  Glucose, capillary     Status: Abnormal   Collection Time: 06/01/18  4:18 PM  Result Value Ref Range   Glucose-Capillary 62 (L) 70 - 99 mg/dL  Glucose, capillary     Status: Abnormal   Collection Time: 06/01/18  5:11 PM  Result Value Ref Range   Glucose-Capillary 63 (L) 70 - 99 mg/dL  Glucose, capillary     Status: Abnormal   Collection Time: 06/01/18  5:55 PM  Result Value Ref Range   Glucose-Capillary 105 (H) 70 - 99 mg/dL  Glucose, capillary     Status: Abnormal   Collection Time: 06/01/18  7:32 PM  Result Value Ref Range   Glucose-Capillary 148 (H) 70 - 99 mg/dL   Comment 1 Notify RN    Comment 2 Document in Chart   Glucose, capillary     Status: Abnormal   Collection Time: 06/01/18  8:34 PM  Result Value Ref Range   Glucose-Capillary 166 (H) 70 - 99 mg/dL   Comment 1 Notify RN    Comment 2 Document in Chart   Glucose, capillary     Status: Abnormal   Collection Time: 06/02/18  8:02 AM  Result Value Ref Range   Glucose-Capillary 370 (H) 70 - 99 mg/dL   Comment 1 Notify RN   Glucose, capillary     Status: Abnormal   Collection Time: 06/02/18 11:40 AM  Result Value Ref Range   Glucose-Capillary 351 (H) 70 - 99 mg/dL    Comment 1 Notify RN   Glucose, capillary     Status: Abnormal   Collection Time: 06/02/18  3:44 PM  Result Value Ref Range   Glucose-Capillary 32 (LL) 70 - 99 mg/dL   Comment 1 Notify RN   Glucose, capillary     Status: None   Collection Time: 06/02/18  4:14 PM  Result Value Ref Range   Glucose-Capillary 88 70 - 99 mg/dL  Glucose, capillary     Status: Abnormal   Collection Time: 06/02/18  5:10 PM  Result Value Ref Range   Glucose-Capillary 165 (H) 70 - 99 mg/dL   Comment 1 Notify RN     Blood Alcohol level:  Lab Results  Component Value Date   ETH 12 (H) 05/31/2018   ETH <10 82/64/1583    Metabolic Disorder Labs: Lab Results  Component Value Date   HGBA1C 8.7 (H) 09/11/2017   MPG 202.99 09/11/2017   No results found for: PROLACTIN Lab Results  Component Value Date   CHOL 217 (H) 09/11/2017   TRIG 135 09/11/2017   HDL 49 09/11/2017   CHOLHDL 4.4 09/11/2017   VLDL 27 09/11/2017   LDLCALC 141 (H) 09/11/2017   LDLCALC 216 (H) 08/23/2017    Physical Findings: AIMS:  , ,  ,  ,    CIWA:    COWS:     Musculoskeletal: Strength & Muscle Tone: within normal limits Gait & Station: normal Patient leans: N/A  Psychiatric Specialty Exam: Physical Exam  Nursing note and vitals reviewed. Psychiatric: He has a normal mood and affect. His speech is normal and behavior is normal. Cognition and memory are normal. He expresses impulsivity. He expresses suicidal ideation.    Review of Systems  Neurological: Negative.   Psychiatric/Behavioral: Positive for suicidal ideas.  All other systems reviewed and are negative.   Blood pressure 124/84, pulse 78, temperature 98.4 F (36.9 C), temperature source Oral, resp. rate 18, height 6' 2"  (1.88 m), weight 113.4 kg, SpO2 94 %.Body mass index is 32.1 kg/m.  General Appearance: Casual  Eye Contact:  Good  Speech:  Clear and Coherent  Volume:  Normal  Mood:  Depressed  Affect:  Appropriate  Thought Process:  Goal Directed and  Descriptions of Associations: Intact  Orientation:  Full (Time, Place, and Person)  Thought Content:  WDL  Suicidal Thoughts:  Yes.  without intent/plan  Homicidal Thoughts:  No  Memory:  Immediate;   Fair Recent;   Fair Remote;   Fair  Judgement:  Poor  Insight:  Lacking  Psychomotor Activity:  Normal  Concentration:  Concentration: Fair and Attention Span: Fair  Recall:  [employer] of Knowledge:  Fair  Language:  Fair  Akathisia:  No  Handed:  Right  AIMS (if indicated):     Assets:  Communication Skills Desire for Improvement Resilience  ADL's:  Intact  Cognition:  WNL  Sleep:        Treatment Plan Summary: Daily contact with patient to assess and evaluate symptoms and progress in treatment and Medication management   PLAN: Please continue sitter. No beds available here. I will continue to try arrange for a bed at the shelter in [place]. Continue current medications.  Psychiatry will follow up.  Orson Slick, MD 06/02/2018, 5:30 PM

## 2018-06-02 NOTE — Progress Notes (Addendum)
Inpatient Diabetes Program Recommendations  AACE/ADA: New Consensus Statement on Inpatient Glycemic Control (2015)  Target Ranges:  Prepandial:   less than 140 mg/dL      Peak postprandial:   less than 180 mg/dL (1-2 hours)      Critically ill patients:  140 - 180 mg/dL    Results for Mark [name], Mark [name] (MRN [id number]) as of 06/02/2018 08:03  Ref. Range 06/01/2018 01:17 06/01/2018 03:25 06/01/2018 05:43 06/01/2018 07:35 06/01/2018 09:37 06/01/2018 11:40 06/01/2018 13:46 06/01/2018 15:51 06/01/2018 16:18 06/01/2018 17:11 06/01/2018 17:55 06/01/2018 19:32 06/01/2018 20:34  Glucose-Capillary Latest Ref Range: 70 - 99 mg/dL 409458 (H) 811357 (H) 914363 (H) 317 (H) 420 (H)  7 units NOVOLOG    237 (H)  10 units NOVOLOG   15 units LEVEMIR at 10am 87  20 units NOVOLOG  48 (L) 62 (L) 63 (L) 105 (H) 148 (H) 166 (H)    Results for Mark [name], Mark [name] (MRN [id number]) as of 06/02/2018 08:03  Ref. Range 06/02/2018 08:02  Glucose-Capillary Latest Ref Range: 70 - 99 mg/dL 086370 (H)     Admit: Suicidal/ Overdose of Insulin  History: DM, Depression  Home DM Meds: Lantus 35 units QHS       Humalog 18 units TID  Current Orders: Novolog Moderate Correction Scale/ SSI (0-15 units) TID AC + HS      Novolog 15 units TID with meals       Note patient admitted with Severe Hypoglycemia due to overdose with insulin (suicide attempt).  CBGs then became extremely elevated.    D5% IVF stopped.  Patient received several large doses of Novolog back to back yesterday and then had another Hypoglycemic event at 4pm yesterday.  CBG 370 mg/dl this AM!     MD- Please consider the following in-hospital insulin adjustments:  1. Restart Lantus 25 units daily--Please give this AM--CBG this AM 370 mg/dl (~57%~75% total home dose)  2. Reduce Novolog Meal Coverage to: Novolog 10 units TID with meals (50% total home dose)  3. Continue Novolog Moderate (0-15 units) SSI     --Will follow patient during  hospitalization--  Ambrose FinlandJeannine Johnston Knolan Simien RN, MSN, CDE Diabetes Coordinator Inpatient Glycemic Control Team Team Pager: [phone] (8a-5p)

## 2018-06-02 NOTE — Plan of Care (Signed)
Resting quietly in room,continues to ask for add. Food although BS is Marine scientistelevated,this writer will continue to educate.Willcontinued planned regimen.sitter at bedside.

## 2018-06-03 LAB — GLUCOSE, CAPILLARY
GLUCOSE-CAPILLARY: 424 mg/dL — AB (ref 70–99)
GLUCOSE-CAPILLARY: 188 mg/dL — AB (ref 70–99)
GLUCOSE-CAPILLARY: 286 mg/dL — AB (ref 70–99)
Glucose-Capillary: 359 mg/dL — ABNORMAL HIGH (ref 70–99)
Glucose-Capillary: 104 mg/dL — ABNORMAL HIGH (ref 70–99)

## 2018-06-03 LAB — HEMOGLOBIN A1C
Hgb A1c MFr Bld: 8.3 % — ABNORMAL HIGH (ref 4.8–5.6)
Mean Plasma Glucose: 192 mg/dL

## 2018-06-03 MED ORDER — INSULIN GLARGINE 100 UNIT/ML ~~LOC~~ SOLN
20.0000 [IU] | Freq: Every day | SUBCUTANEOUS | Status: DC
  Administered 2018-06-03: 20 [IU] via SUBCUTANEOUS
  Filled 2018-06-03 (×2): qty 0.2

## 2018-06-03 MED ORDER — INSULIN ASPART 100 UNIT/ML ~~LOC~~ SOLN
0.0000 [IU] | Freq: Three times a day (TID) | SUBCUTANEOUS | Status: DC
  Administered 2018-06-03: 2 [IU] via SUBCUTANEOUS
  Administered 2018-06-04: 9 [IU] via SUBCUTANEOUS
  Administered 2018-06-04: 5 [IU] via SUBCUTANEOUS
  Filled 2018-06-03 (×3): qty 1

## 2018-06-03 MED ORDER — INSULIN ASPART 100 UNIT/ML ~~LOC~~ SOLN
5.0000 [IU] | Freq: Three times a day (TID) | SUBCUTANEOUS | Status: DC
  Administered 2018-06-03 – 2018-06-04 (×3): 5 [IU] via SUBCUTANEOUS
  Filled 2018-06-03 (×3): qty 1

## 2018-06-03 MED ORDER — QUETIAPINE FUMARATE 25 MG PO TABS
100.0000 mg | ORAL_TABLET | Freq: Every day | ORAL | Status: DC
  Administered 2018-06-03: 100 mg via ORAL
  Filled 2018-06-03: qty 4

## 2018-06-03 MED ORDER — PREGABALIN 75 MG PO CAPS
150.0000 mg | ORAL_CAPSULE | Freq: Two times a day (BID) | ORAL | Status: DC
  Administered 2018-06-03 – 2018-06-04 (×2): 150 mg via ORAL
  Filled 2018-06-03 (×2): qty 2

## 2018-06-03 MED ORDER — INSULIN ASPART 100 UNIT/ML ~~LOC~~ SOLN
20.0000 [IU] | Freq: Once | SUBCUTANEOUS | Status: AC
  Administered 2018-06-03: 20 [IU] via SUBCUTANEOUS
  Filled 2018-06-03: qty 1

## 2018-06-03 MED ORDER — LEVOTHYROXINE SODIUM 100 MCG PO TABS
100.0000 ug | ORAL_TABLET | Freq: Every day | ORAL | Status: DC
  Administered 2018-06-04: 100 ug via ORAL
  Filled 2018-06-03: qty 1

## 2018-06-03 NOTE — Progress Notes (Signed)
[facility] MD Progress Note  06/03/2018 7:15 PM Derek JackMichael Q Kundrat  MRN:  [id number]  Subjective:    [name] insists on feeling suicidal. Yesterday he refused food causing his BS to drop again. He is very much interested in housing opportunity in [place] [facility] where they have a program for homeless patients with mental illness. Left messages with Luster LandsbergRenee [phone], program coordinator.   The patient has been manipulative in the past and always threatens suicide when homeless. Unfortunately, he burned many bridges locally. We do not have a bed available in psychiatry tonight. The problems is that the patient does not benefit from hospitalizations.   Principal Problem: Bipolar I disorder, most recent episode depressed, severe without psychotic features (HCC) Diagnosis:   Patient Active Problem List   Diagnosis Date Noted  . Bipolar I disorder, most recent episode depressed, severe without psychotic features (HCC) [F31.4]     Priority: High  . 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 Diabetes (HCC) [E11.9] 09/11/2017    Priority: Medium  . Suicidal ideation [R45.851]   . Adjustment disorder with depressed mood [F43.21] 12/07/2017  . Noncompliance [Z91.19] 10/17/2017  . Dysthymia [F34.1] 10/08/2017  . Personality disorder (HCC) [F60.9] 09/26/2017  . 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 Obstructive hydrocephalus [G91.1] 09/19/2017  . Hypoglycemia [E16.2] 09/09/2017  . MDD (major depressive disorder) [F32.9] 08/22/2017  . Nausea & vomiting [R11.2] 08/18/2017  . Abdominal pain [R10.9] 08/18/2017  . Hyponatremia [E87.1] 08/18/2017  . DKA (diabetic ketoacidoses) (HCC) [E13.10] 11/24/2015  . Diabetic keto-acidosis (HCC) [E13.10] 10/23/2015   Total Time spent with patient: 20 minutes  Past Psychiatric History: depression, mood instability.  Past Medical History:  Past Medical History:  Diagnosis Date  . Depression    . Diabetes mellitus without complication (HCC)   . Hydrocephalus   . Kidney stones     Past Surgical History:  Procedure Laterality Date  . KIDNEY STONE SURGERY     Family History:  Family History  Problem Relation Age of Onset  . Breast cancer Mother    Family Psychiatric  History: depression. Social History:  Social History   Substance and Sexual Activity  Alcohol Use Never  . Frequency: Never   Comment: occassional      Social History   Substance and Sexual Activity  Drug Use Yes  . Types: Marijuana   Comment: smoked 2-3 days ago    Social History   Socioeconomic History  . Marital status: Single    Spouse name: Not on file  . Number of children: Not on file  . Years of education: Not on file  . Highest education level: Not on file  Occupational History  . Not on file  Social Needs  . Financial resource strain: Not on file  . Food insecurity:    Worry: Not on file    Inability: Not on file  . Transportation needs:    Medical: Not on file    Non-medical: Not on file  Tobacco Use  . Smoking status: Current Every Day Smoker    Packs/day: 0.75    Years: 20.00    Pack years: 15.00    Types: Cigarettes  . Smokeless tobacco: Never Used  Substance and Sexual Activity  . Alcohol use: Never    Frequency: Never    Comment: occassional   . Drug use: Yes    Types: Marijuana    Comment: smoked 2-3 days ago  . Sexual  activity: Not on file  Lifestyle  . Physical activity:    Days per week: Not on file    Minutes per session: Not on file  . Stress: Not on file  Relationships  . Social connections:    Talks on phone: Not on file    Gets together: Not on file    Attends religious service: Not on file    Active member of club or organization: Not on file    Attends meetings of clubs or organizations: Not on file    Relationship status: Not on file  Other Topics Concern  . Not on file  Social History Narrative  . Not on file   Additional Social History:                          Sleep: Fair  Appetite:  Fair  Current Medications: Current Facility-Administered Medications  Medication Dose Route Frequency Provider Last Rate Last Dose  . acetaminophen (TYLENOL) tablet 650 mg  650 mg Oral Q6H PRN Houston Siren, MD       Or  . acetaminophen (TYLENOL) suppository 650 mg  650 mg Rectal Q6H PRN Houston Siren, MD      . divalproex (DEPAKOTE) DR tablet 500 mg  500 mg Oral Q12H Darliss Ridgel, MD   500 mg at 06/03/18 1201  . enoxaparin (LOVENOX) injection 40 mg  40 mg Subcutaneous Q24H Houston Siren, MD   40 mg at 06/02/18 2135  . escitalopram (LEXAPRO) tablet 10 mg  10 mg Oral Daily Darliss Ridgel, MD   10 mg at 06/03/18 1201  . hydrOXYzine (ATARAX/VISTARIL) tablet 50 mg  50 mg Oral Q6H PRN Houston Siren, MD      . insulin aspart (novoLOG) injection 0-5 Units  0-5 Units Subcutaneous QHS Ramonita Lab, MD   2 Units at 06/02/18 2144  . insulin aspart (novoLOG) injection 0-9 Units  0-9 Units Subcutaneous TID WC Ramonita Lab, MD   2 Units at 06/03/18 1201  . insulin aspart (novoLOG) injection 5 Units  5 Units Subcutaneous TID WC Ramonita Lab, MD   5 Units at 06/03/18 1202  . insulin glargine (LANTUS) injection 20 Units  20 Units Subcutaneous Daily Gouru, Aruna, MD   20 Units at 06/03/18 1022  . levothyroxine (SYNTHROID, LEVOTHROID) tablet 100 mcg  100 mcg Oral QAC breakfast Gouru, Aruna, MD      . ondansetron (ZOFRAN) tablet 4 mg  4 mg Oral Q6H PRN Houston Siren, MD       Or  . ondansetron (ZOFRAN) injection 4 mg  4 mg Intravenous Q6H PRN Houston Siren, MD      . pregabalin (LYRICA) capsule 150 mg  150 mg Oral BID Gouru, Aruna, MD      . QUEtiapine (SEROQUEL) tablet 100 mg  100 mg Oral QHS Nahlia Hellmann B, MD        Lab Results:  Results for orders placed or performed during the hospital encounter of 05/31/18 (from the past 48 hour(s))  Glucose, capillary     Status: Abnormal   Collection Time: 06/01/18  7:32 PM   Result Value Ref Range   Glucose-Capillary 148 (H) 70 - 99 mg/dL   Comment 1 Notify RN    Comment 2 Document in Chart   Glucose, capillary     Status: Abnormal   Collection Time: 06/01/18  8:34 PM  Result Value Ref Range   Glucose-Capillary 166 (H)  70 - 99 mg/dL   Comment 1 Notify RN    Comment 2 Document in Chart   Glucose, capillary     Status: Abnormal   Collection Time: 06/02/18  8:02 AM  Result Value Ref Range   Glucose-Capillary 370 (H) 70 - 99 mg/dL   Comment 1 Notify RN   Glucose, capillary     Status: Abnormal   Collection Time: 06/02/18 11:40 AM  Result Value Ref Range   Glucose-Capillary 351 (H) 70 - 99 mg/dL   Comment 1 Notify RN   Glucose, capillary     Status: Abnormal   Collection Time: 06/02/18  3:44 PM  Result Value Ref Range   Glucose-Capillary 32 (LL) 70 - 99 mg/dL   Comment 1 Notify RN   Glucose, capillary     Status: None   Collection Time: 06/02/18  4:14 PM  Result Value Ref Range   Glucose-Capillary 88 70 - 99 mg/dL  Glucose, capillary     Status: Abnormal   Collection Time: 06/02/18  5:10 PM  Result Value Ref Range   Glucose-Capillary 165 (H) 70 - 99 mg/dL   Comment 1 Notify RN   Glucose, capillary     Status: Abnormal   Collection Time: 06/02/18  7:40 PM  Result Value Ref Range   Glucose-Capillary 320 (H) 70 - 99 mg/dL   Comment 1 Notify RN   Glucose, capillary     Status: Abnormal   Collection Time: 06/02/18  9:31 PM  Result Value Ref Range   Glucose-Capillary 245 (H) 70 - 99 mg/dL   Comment 1 Notify RN   Glucose, capillary     Status: Abnormal   Collection Time: 06/03/18  7:37 AM  Result Value Ref Range   Glucose-Capillary 424 (H) 70 - 99 mg/dL   Comment 1 Notify RN   Glucose, capillary     Status: Abnormal   Collection Time: 06/03/18  9:30 AM  Result Value Ref Range   Glucose-Capillary 359 (H) 70 - 99 mg/dL   Comment 1 Notify RN   Glucose, capillary     Status: Abnormal   Collection Time: 06/03/18 11:37 AM  Result Value Ref Range    Glucose-Capillary 188 (H) 70 - 99 mg/dL   Comment 1 Notify RN   Glucose, capillary     Status: Abnormal   Collection Time: 06/03/18  5:47 PM  Result Value Ref Range   Glucose-Capillary 104 (H) 70 - 99 mg/dL   Comment 1 Notify RN     Blood Alcohol level:  Lab Results  Component Value Date   ETH 12 (H) 05/31/2018   ETH <10 03/04/2018    Metabolic Disorder Labs: Lab Results  Component Value Date   HGBA1C 8.3 (H) 06/01/2018   MPG 192 06/01/2018   MPG 202.99 09/11/2017   No results found for: PROLACTIN Lab Results  Component Value Date   CHOL 217 (H) 09/11/2017   TRIG 135 09/11/2017   HDL 49 09/11/2017   CHOLHDL 4.4 09/11/2017   VLDL 27 09/11/2017   LDLCALC 141 (H) 09/11/2017   LDLCALC 216 (H) 08/23/2017    Physical Findings: AIMS:  , ,  ,  ,    CIWA:    COWS:     Musculoskeletal: Strength & Muscle Tone: within normal limits Gait & Station: normal Patient leans: N/A  Psychiatric Specialty Exam: Physical Exam  Nursing note and vitals reviewed. Psychiatric: His speech is normal and behavior is normal. Cognition and memory are normal. He expresses impulsivity.  He exhibits a depressed mood. He expresses suicidal ideation.    Review of Systems  Neurological: Negative.   Psychiatric/Behavioral: Positive for depression and suicidal ideas.  All other systems reviewed and are negative.   Blood pressure 108/77, pulse 89, temperature 98.6 F (37 C), temperature source Oral, resp. rate 18, height 6\' 2"  (1.88 m), weight 113.4 kg, SpO2 96 %.Body mass index is 32.1 kg/m.  General Appearance: Casual  Eye Contact:  Good  Speech:  Clear and Coherent  Volume:  Normal  Mood:  Depressed  Affect:  Appropriate  Thought Process:  Goal Directed and Descriptions of Associations: Intact  Orientation:  Full (Time, Place, and Person)  Thought Content:  WDL  Suicidal Thoughts:  No  Homicidal Thoughts:  No  Memory:  Immediate;   Fair Recent;   Fair Remote;   Fair  Judgement:   Poor  Insight:  Lacking  Psychomotor Activity:  Normal  Concentration:  Concentration: Fair and Attention Span: Fair  Recall:  FiservFair  Fund of Knowledge:  Fair  Language:  Fair  Akathisia:  No  Handed:  Right  AIMS (if indicated):     Assets:  Communication Skills Desire for Improvement Resilience  ADL's:  Intact  Cognition:  WNL  Sleep:        Treatment Plan Summary: Daily contact with patient to assess and evaluate symptoms and progress in treatment and Medication management   [name] is a 32 year old male with a history of depression and diabetes admitted to medical floor after overdose on insulin.  #Suicidal ideation -still endorses SI and refused food to drop his sugars here  #Mood -continue Depakote 500 mg BID -Lexapro 10 mg daily -Seroquel increase to 10 mg nightly  #Social -homeless and without resources hoping to get to [facility] [place] to participate in their mental health program -if accepted, he is no longer suicidal  Kristine LineaJolanta Cora Brierley, MD 06/03/2018, 7:15 PM

## 2018-06-03 NOTE — Progress Notes (Addendum)
Inpatient Diabetes Program Recommendations  AACE/ADA: New Consensus Statement on Inpatient Glycemic Control (2015)  Target Ranges:  Prepandial:   less than 140 mg/dL      Peak postprandial:   less than 180 mg/dL (1-2 hours)      Critically ill patients:  140 - 180 mg/dL   Results for Mark [name], Davontae Q (MRN [id number]) as of 06/03/2018 08:09  Ref. Range 06/02/2018 08:02 06/02/2018 11:40 06/02/2018 15:44 06/02/2018 16:14 06/02/2018 17:10 06/02/2018 19:40 06/02/2018 21:31  Glucose-Capillary Latest Ref Range: 70 - 99 mg/dL 324370 (H)  15 units NOVOLOG  351 (H)  15 units NOVOLOG +  25 units LANTUS  32 (LL) 88 165 (H)  10 units NOVOLOG  320 (H) 245 (H)  2 units NOVOLOG    Results for Mark [name], [name] (MRN [id number]) as of 06/03/2018 08:09  Ref. Range 06/03/2018 07:37  Glucose-Capillary Latest Ref Range: 70 - 99 mg/dL 664424 (H)  20 units NOVOLOG     Admit: Suicidal/ Overdose of Insulin  History: DM, Depression  Home DM Meds: Lantus 35 units QHS                             Humalog 18 units TID  Current Orders: Novolog Moderate Correction Scale/ SSI (0-15 units) TID AC + HS                            Novolog 10 units TID with meals      Lantus 12 units daily      Note patient with severely elevated CBG yesterday AM (CBG 370 mg/dl).    Pt given back to back doses of large doses of Novolog (got a total of 30 units Novolog between 8 and 11:40 yesterday AM).  Severe Hypoglycemia by 3:44pm yesterday afternoon.  Note that Lantus 25 units was also given at 10:51am.  I suspect that the severe Hypoglycemia at 3:44pm yesterday was likely caused by too much Novolog.  Per notes, patient refused to eat lunch yesterday.  Not sure if 12 units Lantus will be enough for this patient as his fasting glucose levels remain very high.  RN also told me that patient had several empty graham cracker wrappers in his room this AM--I wonder if patient ate these crackers prior to AM CBG being  checked??     MD- Please consider the following in-hospital insulin adjustments:  1. Increase Lantus back to 25 units daily (~75% total home dose)  2. Reduce Novolog Meal Coverage: Novolog 5 units TID with meals   3. Reduce Novolog Correction Scale (SSI) to Sensitive scale (0-9 units) TID AC + HS   Addendum 8:45am- Spoke with Dr. Amado CoeGouru by phone to discuss CBGs and situation.  Dr. Amado CoeGouru to make insulin adjustments this AM.    --Will follow patient during hospitalization--  Ambrose FinlandJeannine Johnston Dorianne Perret RN, MSN, CDE Diabetes Coordinator Inpatient Glycemic Control Team Team Pager: [phone] (8a-5p)

## 2018-06-03 NOTE — Care Management (Signed)
RNCM confirmed with Medication Management  On 8/12 that patient completed the financial information and pick up Humalog and Levimer in May 2019.  Patient had Lantus to be pick up on 05/02/18, however did not pick it up.  Per documentation patient has not followed up at [facility] .  RNCM to follow up with patient to determine where he got the Regular insulin that he administered prior to admission

## 2018-06-03 NOTE — Progress Notes (Signed)
Patient called nurse to the room and stated " what's going on with my food, why are they making me eat like this, I don't eat like this outside of here so why is she making me eat like this now, i'm not eating anything else unless they let me eat what I want to eat". Nurse notified Dr. Amado CoeGouru and received order to change diet to regular as the patient is diabetic and will need to eat.

## 2018-06-03 NOTE — Progress Notes (Signed)
[facility] [place] [facility]   PATIENT NAME: Tereasa CoopMichael Wyse    MR#:  [id number]  DATE OF BIRTH:  07/14/1986  SUBJECTIVE:  CHIEF COMPLAINT: Blood sugars are fluctuating because patient is eating erratically and refusing sometimes to eat.  REVIEW OF SYSTEMS:  CONSTITUTIONAL: No fever, fatigue or weakness.  EYES: No blurred or double vision.  EARS, NOSE, AND THROAT: No tinnitus or ear pain.  RESPIRATORY: No cough, shortness of breath, wheezing or hemoptysis.  CARDIOVASCULAR: No chest pain, orthopnea, edema.  GASTROINTESTINAL: No nausea, vomiting, diarrhea or abdominal pain.  GENITOURINARY: No dysuria, hematuria.  ENDOCRINE: No polyuria, nocturia,  HEMATOLOGY: No anemia, easy bruising or bleeding SKIN: No rash or lesion. MUSCULOSKELETAL: No joint pain or arthritis.   NEUROLOGIC: No tingling, numbness, weakness.  PSYCHIATRY: Reports suicidal ideation  DRUG ALLERGIES:   Allergies  Allergen Reactions  . Mushroom Extract Complex Hives, Itching and Nausea And Vomiting  . Penicillins Anaphylaxis, Hives and Swelling    Has patient had a PCN reaction causing immediate rash, facial/tongue/throat swelling, SOB or lightheadedness with hypotension: Yes Has patient had a PCN reaction causing severe rash involving mucus membranes or skin necrosis: No Has patient had a PCN reaction that required hospitalization Yes Has patient had a PCN reaction occurring within the last 10 years: Yes If all of the above answers are "NO", then may proceed with Cephalosporin use.  . Sulfa Antibiotics Anaphylaxis and Hives  . Clindamycin/Lincomycin Hives    VITALS:  Blood pressure 108/77, pulse 89, temperature 98.6 F (37 C), temperature source Oral, resp. rate 18, height 6\' 2"  (1.88 m), weight 113.4 kg, SpO2 96 %.  PHYSICAL EXAMINATION:  GENERAL:  32 y.o.-year-old patient lying in the bed with no acute distress.  EYES: Pupils equal, round, reactive to light and accommodation.  No scleral icterus. Extraocular muscles intact.  HEENT: Head atraumatic, normocephalic. Oropharynx and nasopharynx clear.  NECK:  Supple, no jugular venous distention. No thyroid enlargement, no tenderness.  LUNGS: Normal breath sounds bilaterally, no wheezing, rales,rhonchi or crepitation. No use of accessory muscles of respiration.  CARDIOVASCULAR: S1, S2 normal. No murmurs, rubs, or gallops.  ABDOMEN: Soft, nontender, nondistended. Bowel sounds present.  EXTREMITIES: No pedal edema, cyanosis, or clubbing.  NEUROLOGIC: Cranial nerves II through XII are intact. Sensation intact. Gait not checked.  PSYCHIATRIC: The patient is alert and oriented x 3.  SKIN: No obvious rash, lesion, or ulcer.    LABORATORY PANEL:   CBC Recent Labs  Lab 06/01/18 0431  WBC 7.3  HGB 13.8  HCT 39.8*  PLT 253   ------------------------------------------------------------------------------------------------------------------  Chemistries  Recent Labs  Lab 05/31/18 0934 06/01/18 0431  NA 141 135  K 2.8* 4.8  CL 107 104  CO2 26 25  GLUCOSE 40* 373*  BUN 13 12  CREATININE 1.11 1.00  CALCIUM 9.4 8.8*  MG 1.9  --   AST 26  --   ALT 12  --   ALKPHOS 94  --   BILITOT 0.8  --    ------------------------------------------------------------------------------------------------------------------  Cardiac Enzymes No results for input(s): TROPONINI in the last 168 hours. ------------------------------------------------------------------------------------------------------------------  RADIOLOGY:  No results found.  EKG:   Orders placed or performed during the hospital encounter of 03/04/18  . ED EKG  . ED EKG  . EKG 12-Lead  . EKG 12-Lead  . EKG    ASSESSMENT AND PLAN:    32 year old male with past medical history of diabetes, depression, previous history of suicide attempts due to increasing  insulin intake, neuropathy due to diabetes who presents to the hospital due to suicidal ideations  and taking 120 units of regular insulin this morning.  1.  Hypoglycemia-secondary to increase intake of insulin due to suicide attempt. -Patient with erratic eating and sometimes refusing to eat -Will reduce Lantus to 20 units and change moderate sliding scale to sensitive sliding scale -Will resume D5 drip if needed -Monitor sugars closely   2.  Depression/suicide attempt- patient has done this in the past.  He had a recent admission in July for similar complaints and reasons.  Patient admits to suicidal ideations.  He does not see a psychiatrist. -Patient is currently remains IVC -Seen by psychiatrist Dr. Jennet MaduroPucilowska , trying to arrange for a [facility] [place] -Patient was started on Depakote 500 mg twice a day for mood stabilization and plan is to continue on Lexapro 10 mg once daily for anxiety and depression -Psych will follow the patient on daily basis  3.  Hypokalemia and hypomagnesemia Repleted.  Potassium at 4.8 and magnesium at 1.9  4.  Diabetic neuropathy-continue gabapentin, Lyrica     All the records are reviewed and case discussed with Care Management/Social Workerr. Management plans discussed with the patient, family and they are in agreement.  CODE STATUS: fc   TOTAL TIME TAKING CARE OF THIS PATIENT: 34  minutes.   POSSIBLE D/C IN  1-2 DAYS, DEPENDING ON CLINICAL CONDITION.  Note: This dictation was prepared with Dragon dictation along with smaller phrase technology. Any transcriptional errors that result from this process are unintentional.   Ramonita LabAruna Terriana Barreras M.D on 06/03/2018 at 3:16 PM  Between 7am to 6pm - Pager - [phone] After 6pm go to [email or web address] - password [email or web address]  [facility]  Office  [phone]  CC: Primary care physician; Patient, No Pcp Per

## 2018-06-03 NOTE — Plan of Care (Signed)
Resting quietly in bed,Sitter at bedside.

## 2018-06-04 DIAGNOSIS — F609 Personality disorder, unspecified: Secondary | ICD-10-CM

## 2018-06-04 LAB — GLUCOSE, CAPILLARY
Glucose-Capillary: 364 mg/dL — ABNORMAL HIGH (ref 70–99)
Glucose-Capillary: 280 mg/dL — ABNORMAL HIGH (ref 70–99)

## 2018-06-04 LAB — BASIC METABOLIC PANEL
Anion gap: 8 (ref 5–15)
BUN: 17 mg/dL (ref 6–20)
CO2: 27 mmol/L (ref 22–32)
CREATININE: 1.04 mg/dL (ref 0.61–1.24)
Calcium: 8.8 mg/dL — ABNORMAL LOW (ref 8.9–10.3)
Chloride: 103 mmol/L (ref 98–111)
GFR calc Af Amer: >60 mL/min (ref >60)
Glucose, Bld: 386 mg/dL — ABNORMAL HIGH (ref 70–99)
POTASSIUM: 4.4 mmol/L (ref 3.5–5.1)
Sodium: 138 mmol/L (ref 135–145)

## 2018-06-04 MED ORDER — ESCITALOPRAM OXALATE 10 MG PO TABS: 10.0000 mg | ORAL_TABLET | Freq: Every day | ORAL | 0 refills | Status: DC

## 2018-06-04 MED ORDER — QUETIAPINE FUMARATE 100 MG PO TABS: 100.0000 mg | ORAL_TABLET | Freq: Every day | ORAL | 0 refills | Status: DC

## 2018-06-04 MED ORDER — INSULIN GLARGINE 100 UNIT/ML ~~LOC~~ SOLN: 25.0000 [IU] | Freq: Every day | SUBCUTANEOUS | 0 refills | Status: DC

## 2018-06-04 MED ORDER — ACETAMINOPHEN 325 MG PO TABS: 325.0000 mg | ORAL_TABLET | Freq: Four times a day (QID) | ORAL | Status: DC | PRN

## 2018-06-04 MED ORDER — INSULIN ASPART 100 UNIT/ML ~~LOC~~ SOLN: 5.0000 [IU] | Freq: Three times a day (TID) | SUBCUTANEOUS | 11 refills | Status: DC

## 2018-06-04 MED ORDER — DIVALPROEX SODIUM 500 MG PO DR TAB: 500.0000 mg | DELAYED_RELEASE_TABLET | Freq: Two times a day (BID) | ORAL | 0 refills | Status: DC

## 2018-06-04 MED ORDER — INSULIN ASPART 100 UNIT/ML ~~LOC~~ SOLN: 0.0000 [IU] | Freq: Three times a day (TID) | SUBCUTANEOUS | 0 refills | Status: DC

## 2018-06-04 MED ORDER — INSULIN GLARGINE 100 UNIT/ML ~~LOC~~ SOLN
25.0000 [IU] | Freq: Every day | SUBCUTANEOUS | Status: DC
  Administered 2018-06-04: 25 [IU] via SUBCUTANEOUS
  Filled 2018-06-04 (×2): qty 0.25

## 2018-06-04 MED ORDER — PREGABALIN 150 MG PO CAPS: 150.0000 mg | ORAL_CAPSULE | Freq: Two times a day (BID) | ORAL | 0 refills | Status: DC

## 2018-06-04 NOTE — Clinical Social Work Note (Signed)
Clinical Social Work Assessment  Patient Details  Name: Mark [name] MRN: [id number] Date of Birth: 06/12/1986  Date of referral:  06/04/18               Reason for consult:  Discharge Planning                Permission sought to share information with:    Permission granted to share information::     Name::        Agency::     Relationship::     Contact Information:     Housing/Transportation Living arrangements for the past 2 months:  Homeless Shelter, Homeless Source of Information:  Patient Patient Interpreter Needed:  None Criminal Activity/Legal Involvement Pertinent to Current Situation/Hospitalization:  No - Comment as needed Significant Relationships:  None Lives with:  Self Do you feel safe going back to the place where you live?    Need for family participation in patient care:  No (Coment)  Care giving concerns:  Patient is homeless but bounces around to different homeless shelters and hospitals.   Social Worker assessment / plan:  Patient is well known to the Case Management Department here at [facility]. For several years, patient has on and off again attempted suicide using insulin. Patient used to live on and off with his mother but his mother past away 2 years ago. Patient's behavior was the same prior to his mother passing. After his mother passed it was one less place he could go to live. [place] and all have banned him from returning with the exception of [facility] [address]. Patient becomes verbally and at times physically aggressive at places he goes. A code   Patient presented to hospital with a suicide attempt to overdose on insulin. Psychiatry cleared patient today and rescinded the involuntary commitment. Patient then began complaining about being discharged and that he wanted to [facility] [address] [facility]. He stated "someone" told him that there was a bed  for him there. He became so belligerent with the RN CM that a Security Code had to be called. CSW called the [address] [facility] and was informed that patient was eligible to return (he had been there before) but that he was not guaranteed a bed. He stated that getting a bed is based on a lottery system every night. CSW spoke with Administrator on Call and an [facility] was approved to get him to the shelter in [place] this evening. CSW and the Administrative Coordinator informed patient of this. CSW informed patient that this was the last time that he would be assisted with transportation and that if he returned to the hospital he would need to have a plan for transportation.  Employment status:  Unemployed Health and safety inspectornsurance information:  Self Pay (Medicaid Pending) PT Recommendations:    Information / Referral to community resources:     Patient/Family's Response to care:  Patient was belligerent, agitated, ungrateful.  Patient/Family's Understanding of and Emotional Response to Diagnosis, Current Treatment, and Prognosis:  Patient was belligerent, agitated and ungrateful.  Emotional Assessment Appearance:  Appears stated age Attitude/Demeanor/Rapport:  Complaining, Hostile, Angry, Aggressive (Verbally and/or physically) Affect (typically observed):  Angry, Agitated, Inappropriate, Explosive Orientation:  Oriented to Self, Oriented to Place, Oriented to  Time, Oriented to Situation Alcohol / Substance use:  Not Applicable Psych involvement (Current and /or in the community):  Yes (Comment)  Discharge Needs  Concerns to be addressed:  Homelessness(transportation) Readmission within the last 30 days:  No Current discharge risk:  Homeless Barriers to Discharge:  No Barriers Identified   Mark SpanielMonica Elizah Mierzwa, LCSW 06/04/2018, 4:06 PM

## 2018-06-04 NOTE — Progress Notes (Signed)
Inpatient Diabetes Program Recommendations  AACE/ADA: New Consensus Statement on Inpatient Glycemic Control (2015)  Target Ranges:  Prepandial:   less than 140 mg/dL      Peak postprandial:   less than 180 mg/dL (1-2 hours)      Critically ill patients:  140 - 180 mg/dL   Results for Mark [name], [name] (MRN [id number]) as of 06/04/2018 08:27  Ref. Range 06/03/2018 07:37 06/03/2018 09:30 06/03/2018 11:37 06/03/2018 17:47 06/03/2018 21:32  Glucose-Capillary Latest Ref Range: 70 - 99 mg/dL 409424 (H)  20 units NOVOLOG  359 (H)    20 units LANTUS at 10am 188 (H)  7 units NOVOLOG  104 (H) 286 (H)  3 units NOVOLOG    Results for Mark [name], Mark [name] (MRN [id number]) as of 06/04/2018 08:27  Ref. Range 06/04/2018 07:30  Glucose-Capillary Latest Ref Range: 70 - 99 mg/dL 956364 (H)  14 units NOVOLOG      Admit: Suicidal/ Overdose of Insulin  History: DM, Depression  HomeDM Meds: Lantus 35unitsQHS Humalog 18 unitsTID  Current Orders:Novolog Sensitive Correction Scale/ SSI (0-9 units) TID AC + HS Novolog5 unitsTID with meals                            Lantus 20 units daily       MD- Please consider increasing Lantus to 25 units daily  Continue Current Novolog SSi and Novolog Meal Coverage     --Will follow patient during hospitalization--  Ambrose FinlandJeannine Johnston Angelo Caroll RN, MSN, CDE Diabetes Coordinator Inpatient Glycemic Control Team Team Pager: [phone] (8a-5p)

## 2018-06-04 NOTE — Consult Note (Signed)
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 Suicidal ideation [R45.851]   . Bipolar I disorder, most recent episode depressed, severe without psychotic features ([place]) [F31.4]   . Adjustment disorder with depressed mood [F43.21] 12/07/2017  . Noncompliance [Z91.19] 10/17/2017  . Dysthymia [F34.1] 10/08/2017  . 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 Obstructive hydrocephalus [G91.1] 09/19/2017  . Diabetes ([place]) [E11.9] 09/11/2017  . Hypoglycemia [E16.2] 09/09/2017  . MDD (major depressive disorder) [F32.9] 08/22/2017  . Severe recurrent major depression with psychotic features ([place]) [F33.3] 08/19/2017  . Nausea & vomiting [R11.2] 08/18/2017  . Abdominal pain [R10.9] 08/18/2017  . Hyponatremia [E87.1] 08/18/2017  . DKA (diabetic ketoacidoses) ([place]) [E13.10] 11/24/2015  . Diabetic keto-acidosis ([place]) [E13.10] 10/23/2015    Total Time spent with patient: 1 hour  Subjective:   Mark [name] is a 32 y.o. male patient admitted with "I just need somebody to give me a hand".  HPI: Patient seen chart reviewed.  Case reviewed with nursing and social work and hospitalist.  Patient familiar to me from previous encounters.  32 year old man with chronic mood symptoms who has been in the hospital after intentionally taking excessive insulin.  On interview the patient says that what he primarily wants is assistance getting to [place].  He knows that he cannot stay at the local homeless shelter or the local rescue [facility].  He knows that he can probably get a bed at the [facility] homeless shelter.  Patient is requesting that he be  given financial assistance to get transportation to [place].  He states that if he does not get such assistance he is going to kill himself.  If he did get such assistance however he makes it clear that he has multiple plans for the future.  Intends to continue pursuing getting his disability approved.  Patient is not reporting depression symptoms that are out of the ordinary.  He is not reporting psychotic symptoms.  Not abusing drugs or alcohol.  He has been noncompliant with recommended outpatient psychiatric treatment.  Social history: Patient has very little support.  No finances.  Unable to work.  Has not succeeded in getting disability.  No family that is willing to assist him.  Additionally he has burned her bridges at pretty much every facility set up to help people in need because of his passive aggressive behavior.  Medical history: Type 1 diabetes chronic poor control.  Multiple episodes of DKA.  Substance abuse history: None  Past Psychiatric History: Patient is had multiple visits to the hospital emergency room and has been well examined by psychiatry.  Although he has had mood disorder diagnoses he has never showed consistent compliance with or improvement with medication.  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  He is chronically self-defeating and his behavior passive aggressive in his approach to others.  There have been multiple episodes of him directly threatening to kill himself if some specific assistance is not given to him and then not ever following up on this.  On the other hand he has engaged in self-injurious behavior in the past usually with then  immediately seeking attention at the hospital.  No clear history of ever being truly psychotic no violence to others.  Risk to Self:   Risk to Others:   Prior Inpatient Therapy:   Prior Outpatient Therapy:    Past Medical  History:  Past Medical History:  Diagnosis Date  . Depression   . Diabetes mellitus without complication ([place])   . Hydrocephalus   . Kidney stones     Past Surgical History:  Procedure Laterality Date  . KIDNEY STONE SURGERY     Family History:  Family History  Problem Relation Age of Onset  . Breast cancer Mother    Family Psychiatric  History: None known Social History:  Social History   Substance and Sexual Activity  Alcohol Use Never  . Frequency: Never   Comment: occassional      Social History   Substance and Sexual Activity  Drug Use Yes  . Types: Marijuana   Comment: smoked 2-3 days ago    Social History   Socioeconomic History  . Marital status: Single    Spouse name: Not on file  . Number of children: Not on file  . Years of education: Not on file  . Highest education level: Not on file  Occupational History  . Not on file  Social Needs  . Financial resource strain: Not on file  . Food insecurity:    Worry: Not on file    Inability: Not on file  . Transportation needs:    Medical: Not on file    Non-medical: Not on file  Tobacco Use  . Smoking status: Current Every Day Smoker    Packs/day: 0.75    Years: 20.00    Pack years: 15.00    Types: Cigarettes  . Smokeless tobacco: Never Used  Substance and Sexual Activity  . Alcohol use: Never    Frequency: Never    Comment: occassional   . Drug use: Yes    Types: Marijuana    Comment: smoked 2-3 days ago  . Sexual activity: Not on file  Lifestyle  . Physical activity:    Days per week: Not on file    Minutes per session: Not on file  . Stress: Not on file  Relationships  . Social connections:    Talks on phone: Not on file    Gets together: Not on file    Attends religious service: Not on file    Active member of club or organization: Not on file    Attends meetings of clubs or organizations: Not on file    Relationship status: Not on file  Other Topics Concern  . Not on file  Social  History Narrative  . Not on file   Additional Social History:    Allergies:   Allergies  Allergen Reactions  . Mushroom Extract Complex Hives, Itching and Nausea And Vomiting  . Penicillins Anaphylaxis, Hives and Swelling    Has patient had a PCN reaction causing immediate rash, facial/tongue/throat swelling, SOB or lightheadedness with hypotension: Yes Has patient had a PCN reaction causing severe rash involving mucus membranes or skin necrosis: No Has patient had a PCN reaction that required hospitalization Yes Has patient had a PCN reaction occurring within the last 10 years: Yes If all of the above answers are "NO", then may proceed with Cephalosporin use.  . Sulfa Antibiotics Anaphylaxis and Hives  . Clindamycin/Lincomycin Hives    Labs:  Results for orders placed or performed during the hospital encounter of 05/31/18 (  from the past 48 hour(s))  Glucose, capillary     Status: Abnormal   Collection Time: 06/02/18  7:40 PM  Result Value Ref Range   Glucose-Capillary 320 (H) 70 - 99 mg/dL   Comment 1 Notify RN   Glucose, capillary     Status: Abnormal   Collection Time: 06/02/18  9:31 PM  Result Value Ref Range   Glucose-Capillary 245 (H) 70 - 99 mg/dL   Comment 1 Notify RN   Glucose, capillary     Status: Abnormal   Collection Time: 06/03/18  7:37 AM  Result Value Ref Range   Glucose-Capillary 424 (H) 70 - 99 mg/dL   Comment 1 Notify RN   Glucose, capillary     Status: Abnormal   Collection Time: 06/03/18  9:30 AM  Result Value Ref Range   Glucose-Capillary 359 (H) 70 - 99 mg/dL   Comment 1 Notify RN   Glucose, capillary     Status: Abnormal   Collection Time: 06/03/18 11:37 AM  Result Value Ref Range   Glucose-Capillary 188 (H) 70 - 99 mg/dL   Comment 1 Notify RN   Glucose, capillary     Status: Abnormal   Collection Time: 06/03/18  5:47 PM  Result Value Ref Range   Glucose-Capillary 104 (H) 70 - 99 mg/dL   Comment 1 Notify RN   Glucose, capillary     Status:  Abnormal   Collection Time: 06/03/18  9:32 PM  Result Value Ref Range   Glucose-Capillary 286 (H) 70 - 99 mg/dL  Glucose, capillary     Status: Abnormal   Collection Time: 06/04/18  7:30 AM  Result Value Ref Range   Glucose-Capillary 364 (H) 70 - 99 mg/dL   Comment 1 Notify RN   Basic metabolic panel     Status: Abnormal   Collection Time: 06/04/18  8:01 AM  Result Value Ref Range   Sodium 138 135 - 145 mmol/L   Potassium 4.4 3.5 - 5.1 mmol/L   Chloride 103 98 - 111 mmol/L   CO2 27 22 - 32 mmol/L   Glucose, Bld 386 (H) 70 - 99 mg/dL   BUN 17 6 - 20 mg/dL   Creatinine, Ser 1.04 0.61 - 1.24 mg/dL   Calcium 8.8 (L) 8.9 - 10.3 mg/dL   GFR calc non Af Amer >60 >60 mL/min   GFR calc Af Amer >60 >60 mL/min    Comment: (NOTE) The eGFR has been calculated using the CKD EPI equation. This calculation has not been validated in all clinical situations. eGFR's persistently <60 mL/min signify possible Chronic Kidney Disease.    Anion gap 8 5 - 15    Comment: Performed at [facility], [address] [place], [place] [address]  Glucose, capillary     Status: Abnormal   Collection Time: 06/04/18 11:36 AM  Result Value Ref Range   Glucose-Capillary 280 (H) 70 - 99 mg/dL   Comment 1 Notify RN     Current Facility-Administered Medications  Medication Dose Route Frequency Provider Last Rate Last Dose  . acetaminophen (TYLENOL) tablet 650 mg  650 mg Oral Q6H PRN Henreitta Leber, MD       Or  . acetaminophen (TYLENOL) suppository 650 mg  650 mg Rectal Q6H PRN Henreitta Leber, MD      . divalproex (DEPAKOTE) DR tablet 500 mg  500 mg Oral Q12H [name] Mann, MD   500 mg at 06/04/18 1051  . enoxaparin (LOVENOX) injection 40 mg  40 mg Subcutaneous Q24H Henreitta Leber, MD   40 mg at 06/03/18 2212  . escitalopram (LEXAPRO) tablet 10 mg  10 mg Oral Daily [name] Mann, MD   10 mg at 06/04/18 1051  . hydrOXYzine (ATARAX/VISTARIL) tablet 50 mg  50 mg Oral Q6H PRN Henreitta Leber, MD       . insulin aspart (novoLOG) injection 0-5 Units  0-5 Units Subcutaneous QHS Nicholes Mango, MD   3 Units at 06/03/18 2212  . insulin aspart (novoLOG) injection 0-9 Units  0-9 Units Subcutaneous TID WC Gouru, Aruna, MD   5 Units at 06/04/18 1226  . insulin aspart (novoLOG) injection 5 Units  5 Units Subcutaneous TID WC Gouru, Aruna, MD   5 Units at 06/04/18 1226  . insulin glargine (LANTUS) injection 25 Units  25 Units Subcutaneous Daily Gouru, Aruna, MD   25 Units at 06/04/18 1050  . levothyroxine (SYNTHROID, LEVOTHROID) tablet 100 mcg  100 mcg Oral QAC breakfast Gouru, Aruna, MD   100 mcg at 06/04/18 0812  . ondansetron (ZOFRAN) tablet 4 mg  4 mg Oral Q6H PRN Henreitta Leber, MD       Or  . ondansetron (ZOFRAN) injection 4 mg  4 mg Intravenous Q6H PRN Henreitta Leber, MD      . pregabalin (LYRICA) capsule 150 mg  150 mg Oral BID Gouru, Aruna, MD   150 mg at 06/04/18 1051  . QUEtiapine (SEROQUEL) tablet 100 mg  100 mg Oral QHS Pucilowska, Jolanta B, MD   100 mg at 06/03/18 2211   Current Outpatient Medications  Medication Sig Dispense Refill  . blood glucose meter kit and supplies KIT Dispense based on patient and insurance preference. Use up to four times daily as directed. (FOR ICD-9 250.00, 250.01). 1 each 0  . Insulin Pen Needle (NOVOFINE) 30G X 8 MM MISC Inject 10 each into the skin as needed. 100 each 0  . levothyroxine (SYNTHROID, LEVOTHROID) 100 MCG tablet Take 100 mcg by mouth daily before breakfast.    . acetaminophen (TYLENOL) 325 MG tablet Take 1 tablet (325 mg total) by mouth every 6 (six) hours as needed for mild pain (or Fever >/= 101).    Marland Kitchen divalproex (DEPAKOTE) 500 MG DR tablet Take 1 tablet (500 mg total) by mouth every 12 (twelve) hours. 60 tablet 0  . [START ON 06/05/2018] escitalopram (LEXAPRO) 10 MG tablet Take 1 tablet (10 mg total) by mouth daily. 30 tablet 0  . insulin aspart (NOVOLOG) 100 UNIT/ML injection Inject 0-9 Units into the skin 3 (three) times daily with meals.  CBG < 70: implement hypoglycemia protocol CBG 70 - 120: 0 units CBG 121 - 150: 1 unit CBG 151 - 200: 2 units CBG 201 - 250: 3 units CBG 251 - 300: 5 units CBG 301 - 350: 7 units CBG 351 - 400: 9 units CBG > 400: call MD and obtain STAT lab verification, Do NOT hold if patient is NPO. Sensitive Scale. 100 mL 0  . insulin aspart (NOVOLOG) 100 UNIT/ML injection Inject 5 Units into the skin 3 (three) times daily with meals. 10 mL 11  . [START ON 06/05/2018] insulin glargine (LANTUS) 100 UNIT/ML injection Inject 0.25 mLs (25 Units total) into the skin daily. 2 vial 0  . pregabalin (LYRICA) 150 MG capsule Take 1 capsule (150 mg total) by mouth 2 (two) times daily. 60 capsule 0  . QUEtiapine (SEROQUEL) 100 MG tablet Take 1 tablet (100 mg total) by mouth at  bedtime. 30 tablet 0    Musculoskeletal: Strength & Muscle Tone: within normal limits Gait & Station: normal Patient leans: N/A  Psychiatric Specialty Exam: Physical Exam  Nursing note and vitals reviewed. Constitutional: He appears well-developed and well-nourished.  HENT:  Head: Normocephalic and atraumatic.  Eyes: Pupils are equal, round, and reactive to light. Conjunctivae are normal.  Neck: Normal range of motion.  Cardiovascular: Regular rhythm and normal heart sounds.  Respiratory: Effort normal. No respiratory distress.  GI: Soft.  Musculoskeletal: Normal range of motion.  Neurological: He is alert.  Skin: Skin is warm and dry.  Psychiatric: His speech is normal and behavior is normal. His mood appears anxious. His affect is angry. He is not aggressive, not hyperactive and not combative. Thought content is not paranoid. He expresses impulsivity and inappropriate judgment. He expresses suicidal ideation. He expresses no homicidal ideation. He expresses no suicidal plans.    Review of Systems  Constitutional: Negative.   HENT: Negative.   Eyes: Negative.   Respiratory: Negative.   Cardiovascular: Negative.    Gastrointestinal: Negative.   Musculoskeletal: Negative.   Skin: Negative.   Neurological: Negative.   Psychiatric/Behavioral: Positive for suicidal ideas. Negative for depression, hallucinations, memory loss and substance abuse. The patient is nervous/anxious and has insomnia.     Blood pressure 113/82, pulse 73, temperature 98.4 F (36.9 C), temperature source Oral, resp. rate 18, height 6' 2"  (1.88 m), weight 113.4 kg, SpO2 95 %.Body mass index is 32.1 kg/m.  General Appearance: Disheveled  Eye Contact:  Good  Speech:  Clear and Coherent  Volume:  Normal  Mood:  Dysphoric and Irritable  Affect:  Congruent  Thought Process:  Goal Directed  Orientation:  Full (Time, Place, and Person)  Thought Content:  Illogical  Suicidal Thoughts:  Yes.  without intent/plan  Homicidal Thoughts:  No  Memory:  Immediate;   Fair Recent;   Fair Remote;   Fair  Judgement:  Impaired  Insight:  Shallow  Psychomotor Activity:  Normal  Concentration:  Concentration: Poor  Recall:  [employer] of Knowledge:  Fair  Language:  Fair  Akathisia:  No  Handed:  Right  AIMS (if indicated):     Assets:  Desire for Improvement  ADL's:  Intact  Cognition:  Impaired,  Mild  Sleep:        Treatment Plan Summary: Plan Patient is well-known to the psychiatric service.  His presentation today is identical to what I have seen many times before from him.  He will make statements about how he is going to kill himself and even specify that he intends to go to [place] and get a knife and stab himself however he is very clear that he only will do this if he is not given the specific thing which she desires which is simply a few dollars to ride the bus to [place].  There is no evidence that he truly is severely depressed and no evidence that he is psychotic.  Patient certainly is at chronic risk of self injury but I do not believe that this is a result of a treatable mental illness.  It is the result of his chronic poor  coping skills.  Patient and I do not think would benefit from psychiatric hospitalization and does not meet commitment criteria.  He is at his baseline terms of mental state.  I am discontinuing the involuntary commitment.  I have talked with the patient at some length today similarly to how I have  done in the past trying to point out to him that there could be some other reasonable ways that he could get his needs met.  As usual he refuses this.  He uses his threats of suicide as a way primarily of threatening me or other care providers.  In this situation I again do not think he meets commitment criteria nor does he require inpatient treatment.  Patient reminded that he bears responsibility for his own actions.  Strongly support him in trying to get into some kind of sustained stability with a place to stay.  Case reviewed with treatment staff on the unit as noted.  Disposition: Patient does not meet criteria for psychiatric inpatient admission. Supportive therapy provided about ongoing stressors. Discussed crisis plan, support from social network, calling 911, coming to the Emergency Department, and calling Suicide Hotline.  Alethia Berthold, MD 06/04/2018 6:51 PM

## 2018-06-04 NOTE — Progress Notes (Addendum)
Discharge teaching given to patient, patient verbalized understanding and had no questions. Patient IV removed. [place] will be [place] [facility] to homeless shelter in which the patient has not been guaranteed a bed per social worker report . All patient belongings gathered prior to leaving.

## 2018-06-04 NOTE — Discharge Instructions (Signed)
Follow-up with primary care physician in 4 to 5 days Follow-up with [facility] in 1 week or sooner as needed Follow-up with diabetic clinic in 2 to 3 days

## 2018-06-04 NOTE — Care Management (Signed)
Patient to discharge today.  CSW has spoke with Noreene LarssonJill at [facility] that the hospital is not to provide assistance with any medications at discharge, due to the patient's history.  Patient confirms that he has his glucometer in his belongings. RNCM has confirmed with [facility] that he has already submitted his financial information for the year to be eligible.  Patient will be provided paper perscriptions and will be able to pick up him to pick up at [facility]  Should he choose.

## 2018-06-04 NOTE — Discharge Summary (Signed)
[facility] NAME: Mark [name]    MR#:  [id number]  DATE OF BIRTH:  30-May-1986  DATE OF ADMISSION:  05/31/2018 ADMITTING PHYSICIAN: Henreitta Leber, MD  DATE OF DISCHARGE:  06/04/18   PRIMARY CARE PHYSICIAN: Patient, No Pcp Per    ADMISSION DIAGNOSIS:  Suicidal ideation [R45.851] Insulin overdose, intentional self-harm, sequela ([place]) [T38.3X2S]  DISCHARGE DIAGNOSIS:  Principal Problem:   Bipolar I disorder, most recent episode depressed, severe without psychotic features ([place]) Active Problems:   Hypoglycemia   Suicidal ideation   SECONDARY DIAGNOSIS:   Past Medical History:  Diagnosis Date  . Depression   . Diabetes mellitus without complication ([place])   . Hydrocephalus   . Kidney stones     HOSPITAL COURSE:   HPI  Mark [name]  is a 32 y.o. male with a known history of diabetes, depression, history of nephrolithiasis, previous admissions due to suicide attempt from taking increasing doses of insulin presents to the hospital today complaining of suicidal ideations and taking 120 units of regular insulin earlier this morning.  Patient has been in the ER and is having his fingersticks monitored every hour but despite being on a dextrose drip and getting pulse doses of IV dextrose and eating he continues to be hypoglycemic and therefore hospitalist services contacted for admission.  Patient does admit to suicidal ideations but denies homicidal ideations or any hallucinations.  He denies any other associated symptoms presently.  1. Hypoglycemia-secondary to increase intake of insulin due to suicide attempt. -Patient with erratic eating and sometimes refusing to eat - Lantus dose adjusted to 25 units once daily .  NovoLog sliding scale coverage and will coverage.  Discussed with diabetic coordinator -Reinforced the importance of following the diabetic diet but patient is refusing   2. Depression/suicide attempt-  patient has done this in the past. He had a recent admission in July for similar complaints and reasons. Patient admits to suicidal ideations. He does not see a psychiatrist. -Seen by psychiatrist Dr. Bary Leriche , trying to arrange for a bed at the shelter in [facility], [place] -Patient was started on Depakote 500 mg twice a day for mood stabilization and plan is to continue on Lexapro 10 mg once daily for anxiety and depression -Discussed with Dr. Weber Cooks will resend IVC and patient is to go back to shelter at [facility] -Case management and social worker following regarding transportation  3. Hypokalemia and hypomagnesemia Repleted.  Potassium at 4.8 and magnesium at 1.9  4. Diabetic neuropathy-continue gabapentin,Lyrica  DISCHARGE CONDITIONS:   FAIR  CONSULTS OBTAINED:     PROCEDURES  NONE   DRUG ALLERGIES:   Allergies  Allergen Reactions  . Mushroom Extract Complex Hives, Itching and Nausea And Vomiting  . Penicillins Anaphylaxis, Hives and Swelling    Has patient had a PCN reaction causing immediate rash, facial/tongue/throat swelling, SOB or lightheadedness with hypotension: Yes Has patient had a PCN reaction causing severe rash involving mucus membranes or skin necrosis: No Has patient had a PCN reaction that required hospitalization Yes Has patient had a PCN reaction occurring within the last 10 years: Yes If all of the above answers are "NO", then may proceed with Cephalosporin use.  . Sulfa Antibiotics Anaphylaxis and Hives  . Clindamycin/Lincomycin Hives    DISCHARGE MEDICATIONS:   Allergies as of 06/04/2018      Reactions   Mushroom Extract Complex Hives, Itching, Nausea And Vomiting   Penicillins Anaphylaxis, Hives, Swelling  Has patient had a PCN reaction causing immediate rash, facial/tongue/throat swelling, SOB or lightheadedness with hypotension: Yes Has patient had a PCN reaction causing severe rash involving mucus membranes or skin necrosis:  No Has patient had a PCN reaction that required hospitalization Yes Has patient had a PCN reaction occurring within the last 10 years: Yes If all of the above answers are "NO", then may proceed with Cephalosporin use.   Sulfa Antibiotics Anaphylaxis, Hives   Clindamycin/lincomycin Hives      Medication List    STOP taking these medications   buPROPion 150 MG 12 hr tablet Commonly known as:  WELLBUTRIN SR   diphenhydramine-acetaminophen 25-500 MG Tabs tablet Commonly known as:  TYLENOL PM   gabapentin 300 MG capsule Commonly known as:  NEURONTIN   gabapentin 400 MG capsule Commonly known as:  NEURONTIN   haloperidol decanoate 100 MG/ML injection Commonly known as:  HALDOL DECANOATE   hydrOXYzine 25 MG tablet Commonly known as:  ATARAX/VISTARIL   insulin detemir 100 UNIT/ML injection Commonly known as:  LEVEMIR   insulin lispro 100 UNIT/ML injection Commonly known as:  HUMALOG   traZODone 100 MG tablet Commonly known as:  DESYREL   venlafaxine XR 150 MG 24 hr capsule Commonly known as:  EFFEXOR-XR     TAKE these medications   acetaminophen 325 MG tablet Commonly known as:  TYLENOL Take 1 tablet (325 mg total) by mouth every 6 (six) hours as needed for mild pain (or Fever >/= 101).   blood glucose meter kit and supplies Kit Dispense based on patient and insurance preference. Use up to four times daily as directed. (FOR ICD-9 250.00, 250.01).   divalproex 500 MG DR tablet Commonly known as:  DEPAKOTE Take 1 tablet (500 mg total) by mouth every 12 (twelve) hours.   escitalopram 10 MG tablet Commonly known as:  LEXAPRO Take 1 tablet (10 mg total) by mouth daily. Start taking on:  06/05/2018   insulin aspart 100 UNIT/ML injection Commonly known as:  novoLOG Inject 0-9 Units into the skin 3 (three) times daily with meals. CBG < 70: implement hypoglycemia protocol CBG 70 - 120: 0 units CBG 121 - 150: 1 unit CBG 151 - 200: 2 units CBG 201 - 250: 3 units CBG 251  - 300: 5 units CBG 301 - 350: 7 units CBG 351 - 400: 9 units CBG > 400: call MD and obtain STAT lab verification, Do NOT hold if patient is NPO. Sensitive Scale. What changed:    how much to take  additional instructions   insulin aspart 100 UNIT/ML injection Commonly known as:  novoLOG Inject 5 Units into the skin 3 (three) times daily with meals. What changed:  You were already taking a medication with the same name, and this prescription was added. Make sure you understand how and when to take each.   insulin glargine 100 UNIT/ML injection Commonly known as:  LANTUS Inject 0.25 mLs (25 Units total) into the skin daily. Start taking on:  06/05/2018 What changed:    how much to take  when to take this   Insulin Pen Needle 30G X 8 MM Misc Commonly known as:  NOVOFINE Inject 10 each into the skin as needed.   levothyroxine 100 MCG tablet Commonly known as:  SYNTHROID, LEVOTHROID Take 100 mcg by mouth daily before breakfast.   pregabalin 150 MG capsule Commonly known as:  LYRICA Take 1 capsule (150 mg total) by mouth 2 (two) times daily.   QUEtiapine 100  MG tablet Commonly known as:  SEROQUEL Take 1 tablet (100 mg total) by mouth at bedtime. What changed:    when to take this  reasons to take this        DISCHARGE INSTRUCTIONS:   Follow-up with primary care physician in 4 to 5 days Follow-up with [facility] in 1 week or sooner as needed  DIET:  Diabetic diet  DISCHARGE CONDITION:  Fair  ACTIVITY:  Activity as tolerated  OXYGEN:  Home Oxygen: No.   Oxygen Delivery: room air  DISCHARGE LOCATION:  home -[place]  If you experience worsening of your admission symptoms, develop shortness of breath, life threatening emergency, suicidal or homicidal thoughts you must seek medical attention immediately by calling 911 or calling your MD immediately  if symptoms less severe.  You Must read complete instructions/literature along with all the possible  adverse reactions/side effects for all the Medicines you take and that have been prescribed to you. Take any new Medicines after you have completely understood and accpet all the possible adverse reactions/side effects.   Please note  You were cared for by a hospitalist during your hospital stay. If you have any questions about your discharge medications or the care you received while you were in the hospital after you are discharged, you can call the unit and asked to speak with the hospitalist on call if the hospitalist that took care of you is not available. Once you are discharged, your primary care physician will handle any further medical issues. Please note that NO REFILLS for any discharge medications will be authorized once you are discharged, as it is imperative that you return to your primary care physician (or establish a relationship with a primary care physician if you do not have one) for your aftercare needs so that they can reassess your need for medications and monitor your lab values.     Today  Chief Complaint  Patient presents with  . Suicidal   Patient is feeling fine.  Dr. Weber Cooks is resending IVC and patient wants to go back to [place]  ROS:  CONSTITUTIONAL: Denies fevers, chills. Denies any fatigue, weakness.  EYES: Denies blurry vision, double vision, eye pain. EARS, NOSE, THROAT: Denies tinnitus, ear pain, hearing loss. RESPIRATORY: Denies cough, wheeze, shortness of breath.  CARDIOVASCULAR: Denies chest pain, palpitations, edema.  GASTROINTESTINAL: Denies nausea, vomiting, diarrhea, abdominal pain. Denies bright red blood per rectum. GENITOURINARY: Denies dysuria, hematuria. ENDOCRINE: Denies nocturia or thyroid problems. HEMATOLOGIC AND LYMPHATIC: Denies easy bruising or bleeding. SKIN: Denies rash or lesion. MUSCULOSKELETAL: Denies pain in neck, back, shoulder, knees, hips or arthritic symptoms.  NEUROLOGIC: Denies paralysis, paresthesias.   PSYCHIATRIC: Denies anxiety or depressive symptoms.   VITAL SIGNS:  Blood pressure 113/82, pulse 73, temperature 98.4 F (36.9 C), temperature source Oral, resp. rate 18, height _0  (1.88 m), weight 113.4 kg, SpO2 95 %.  I/O:    Intake/Output Summary (Last 24 hours) at 06/04/2018 1513 Last data filed at 06/04/2018 0659 Gross per 24 hour  Intake 600 ml  Output 254 ml  Net 346 ml    PHYSICAL EXAMINATION:  GENERAL:  32 y.o.-year-old patient lying in the bed with no acute distress.  EYES: Pupils equal, round, reactive to light and accommodation. No scleral icterus. Extraocular muscles intact.  HEENT: Head atraumatic, normocephalic. Oropharynx and nasopharynx clear.  NECK:  Supple, no jugular venous distention. No thyroid enlargement, no tenderness.  LUNGS: Normal breath sounds bilaterally, no wheezing, rales,rhonchi or crepitation. No use of  accessory muscles of respiration.  CARDIOVASCULAR: S1, S2 normal. No murmurs, rubs, or gallops.  ABDOMEN: Soft, non-tender, non-distended. Bowel sounds present.  EXTREMITIES: No pedal edema, cyanosis, or clubbing.  NEUROLOGIC: Cranial nerves II through XII are intact. Muscle strength 5/5 in all extremities. Sensation intact. Gait not checked.  PSYCHIATRIC: The patient is alert and oriented x 3.  SKIN: No obvious rash, lesion, or ulcer.   DATA REVIEW:   CBC Recent Labs  Lab 06/01/18 0431  WBC 7.3  HGB 13.8  HCT 39.8*  PLT 253    Chemistries  Recent Labs  Lab 05/31/18 0934  06/04/18 0801  NA 141   < > 138  K 2.8*   < > 4.4  CL 107   < > 103  CO2 26   < > 27  GLUCOSE 40*   < > 386*  BUN 13   < > 17  CREATININE 1.11   < > 1.04  CALCIUM 9.4   < > 8.8*  MG 1.9  --   --   AST 26  --   --   ALT 12  --   --   ALKPHOS 94  --   --   BILITOT 0.8  --   --    < > = values in this interval not displayed.    Cardiac Enzymes No results for input(s): TROPONINI in the last 168 hours.  Microbiology Results  Results for orders placed  or performed during the hospital encounter of 05/31/18  MRSA PCR Screening     Status: None   Collection Time: 05/31/18  4:55 PM  Result Value Ref Range Status   MRSA by PCR NEGATIVE NEGATIVE Final    Comment:        The GeneXpert MRSA Assay (FDA approved for NASAL specimens only), is one component of a comprehensive MRSA colonization surveillance program. It is not intended to diagnose MRSA infection nor to guide or monitor treatment for MRSA infections. Performed at [facility], [address], [place], [place] [address]     RADIOLOGY:  No results found.  EKG:   Orders placed or performed during the hospital encounter of 03/04/18  . ED EKG  . ED EKG  . EKG 12-Lead  . EKG 12-Lead  . EKG      Management plans discussed with the patient, HE  Is  in agreement.  CODE STATUS:     Code Status Orders  (From admission, onward)         Start     Ordered   05/31/18 1542  Full code  Continuous     05/31/18 1541        Code Status History    Date Active Date Inactive Code Status Order ID Comments User Context   12/29/2017 1415 12/31/2017 1930 Full Code [id number]  Saundra Shelling, MD Inpatient   12/06/2017 1419 12/07/2017 1444 Full Code [id number]  Domenic Moras, PA-C ED   09/10/2017 2230 09/26/2017 1554 Full Code [id number]  Gonzella Lex, MD Inpatient   09/10/2017 2230 09/10/2017 2230 Full Code [id number]  Gonzella Lex, MD Inpatient   09/09/2017 2004 09/10/2017 2153 Full Code [id number]  Gorden Harms, MD Inpatient   08/22/2017 1605 08/29/2017 1613 Full Code [id number]  Marylin Crosby, MD Inpatient   08/22/2017 1518 08/22/2017 1605 Full Code [id number]  Gonzella Lex, MD Inpatient   08/22/2017 1518 08/22/2017 1518 Full Code [id number]  Gonzella Lex, MD Inpatient   08/18/2017 1725 08/22/2017  [place] Full Code [id number]  Idelle Crouch, MD Inpatient   11/24/2015 1356 11/25/2015 1828 Full Code [id number]  Bettey Costa, MD Inpatient   10/23/2015 1012 10/24/2015 2027 Full Code  [id number]  Loletha Grayer, MD ED      TOTAL TIME TAKING CARE OF THIS PATIENT: 41 minutes.   Note: This dictation was prepared with Dragon dictation along with smaller phrase technology. Any transcriptional errors that result from this process are unintentional.   _0 @  on 06/04/2018 at 3:13 PM  Between 7am to 6pm - Pager - [phone]  After 6pm go to [email or web address] - password [email or web address] [facility]  Office  [phone]  CC: Primary care physician; Patient, No Pcp Per

## 2018-07-25 IMAGING — CR DG HIP (WITH OR WITHOUT PELVIS) 2-3V*R*
3 series · 3 of 3 positions shown · non-contrast
Comparison: None.

CLINICAL DATA: Pain following fall

EXAM:
DG HIP (WITH OR WITHOUT PELVIS) 2-3V RIGHT

[pelvis ap]
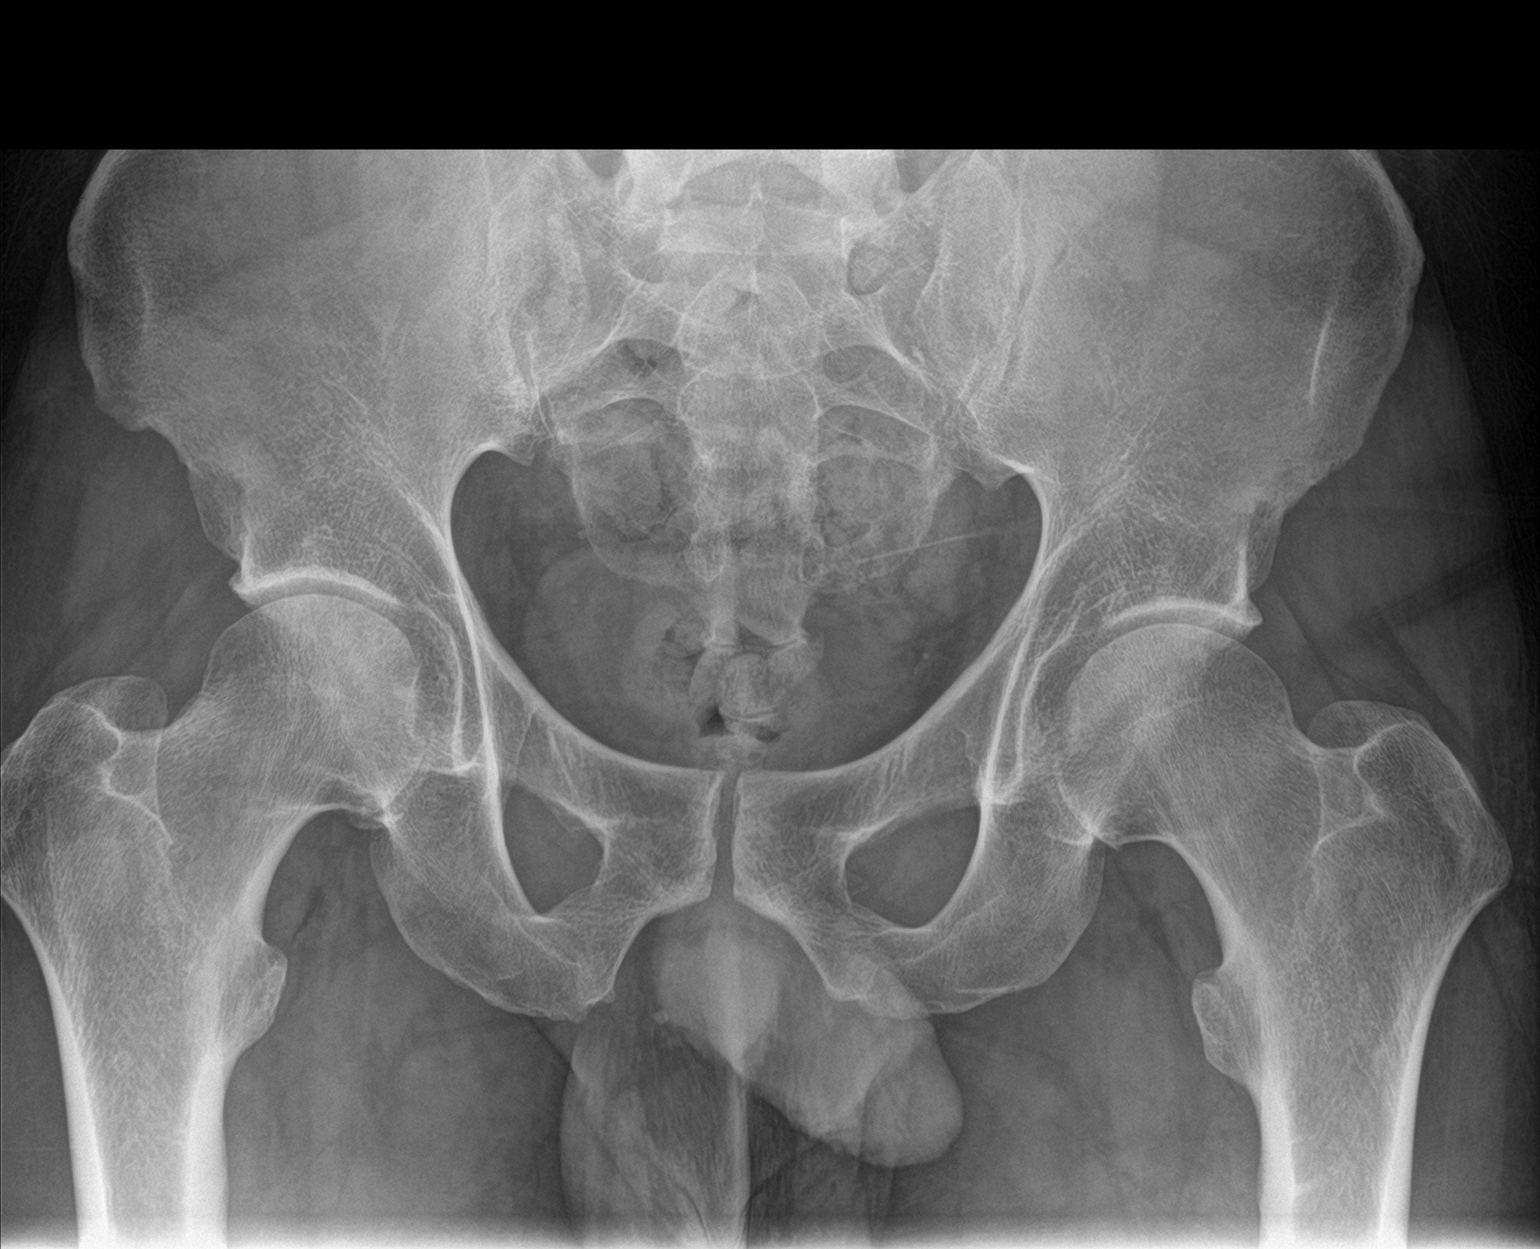

[hip ap]
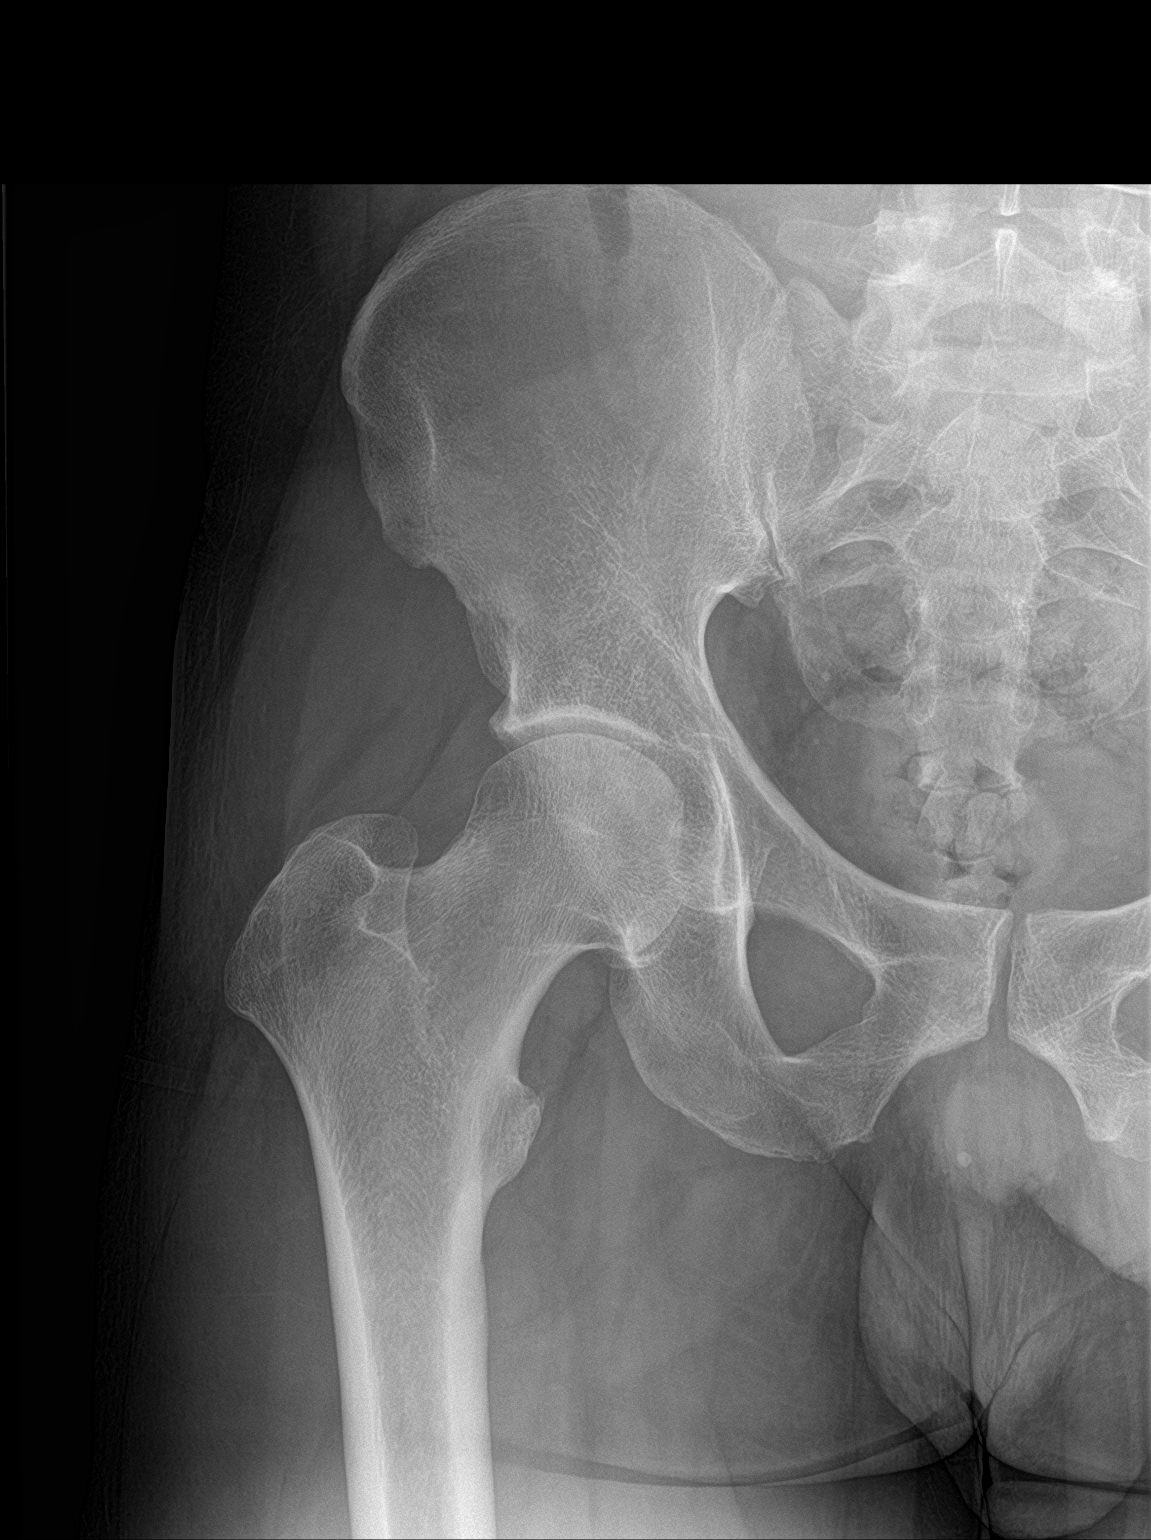

[hip lat]
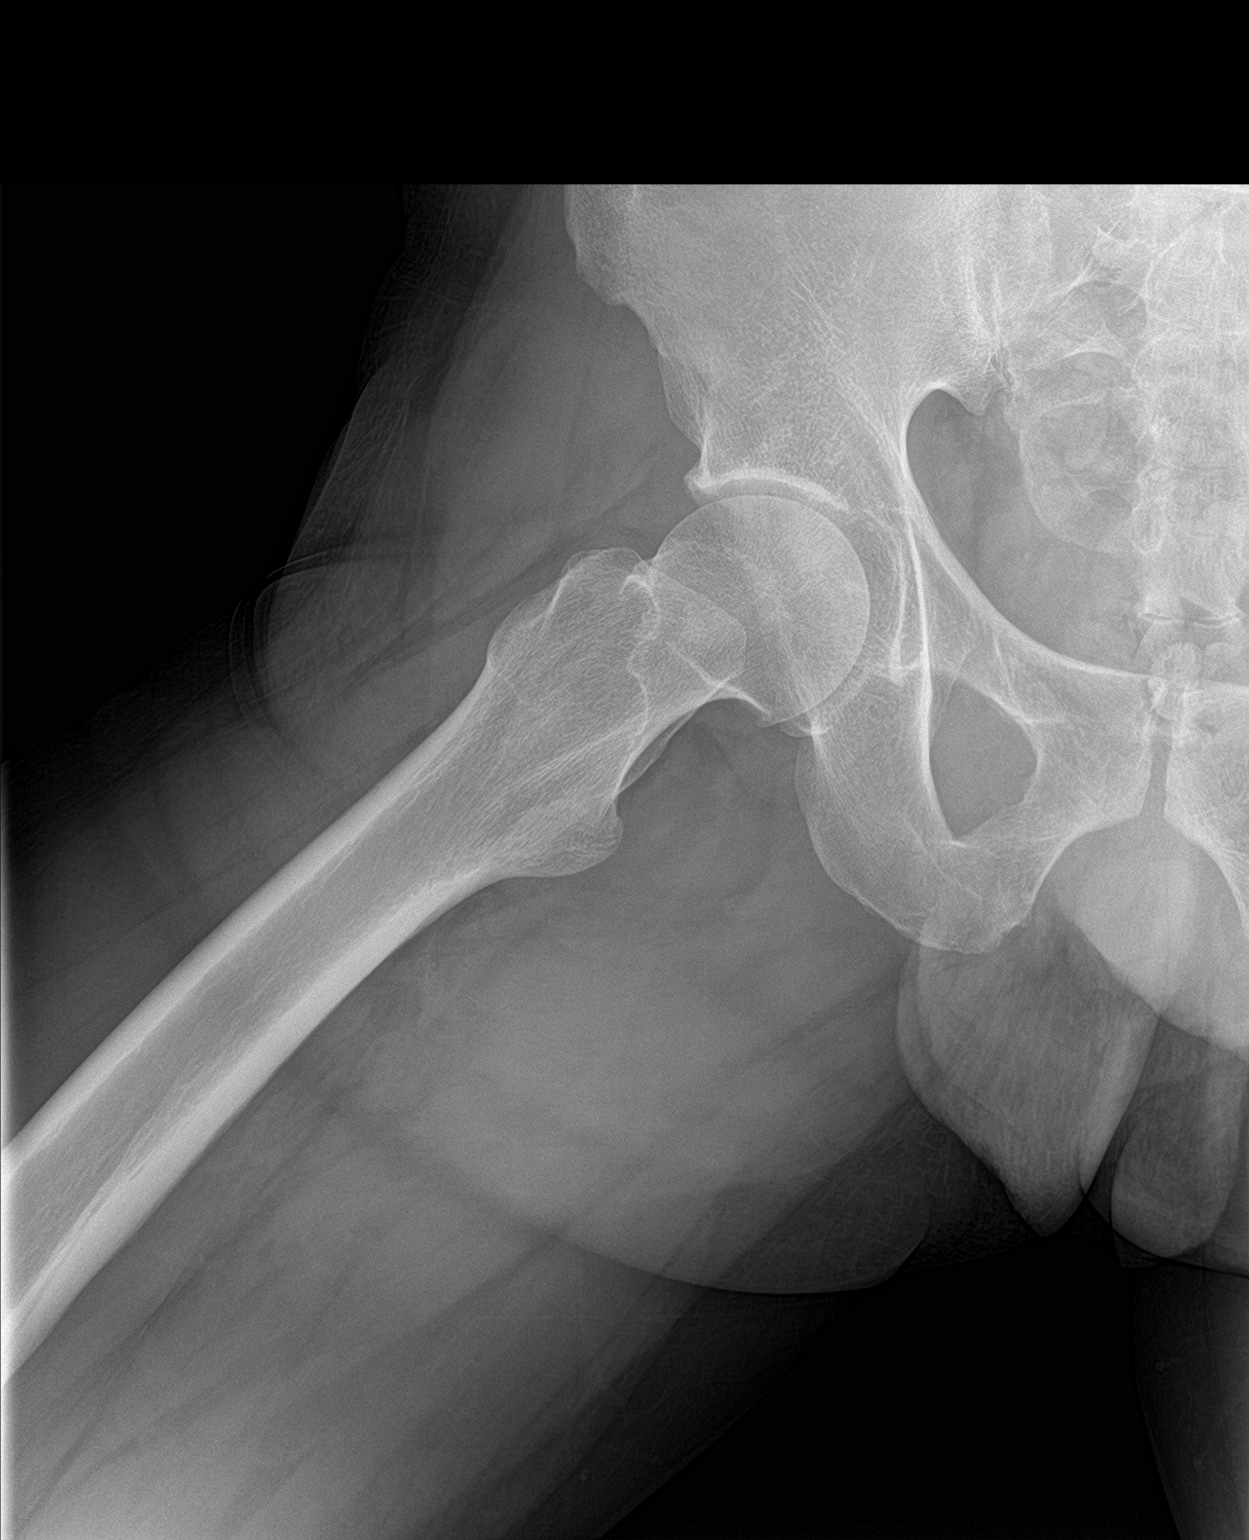

[3 of 3 positions shown; findings below may reference images not displayed]

FINDINGS: Frontal pelvis as well as frontal and lateral right hip images were
obtained. There is no fracture or dislocation. Joint spaces appear
normal. No erosive change.
IMPRESSION: No fracture or dislocation.  No evident arthropathy.

## 2018-08-04 IMAGING — DX DG ELBOW 2V*R*
2 series · 2 of 2 positions shown · non-contrast
Comparison: None.

CLINICAL DATA: Fall, right elbow pain

EXAM:
RIGHT ELBOW - 2 VIEW

[elbow ap]
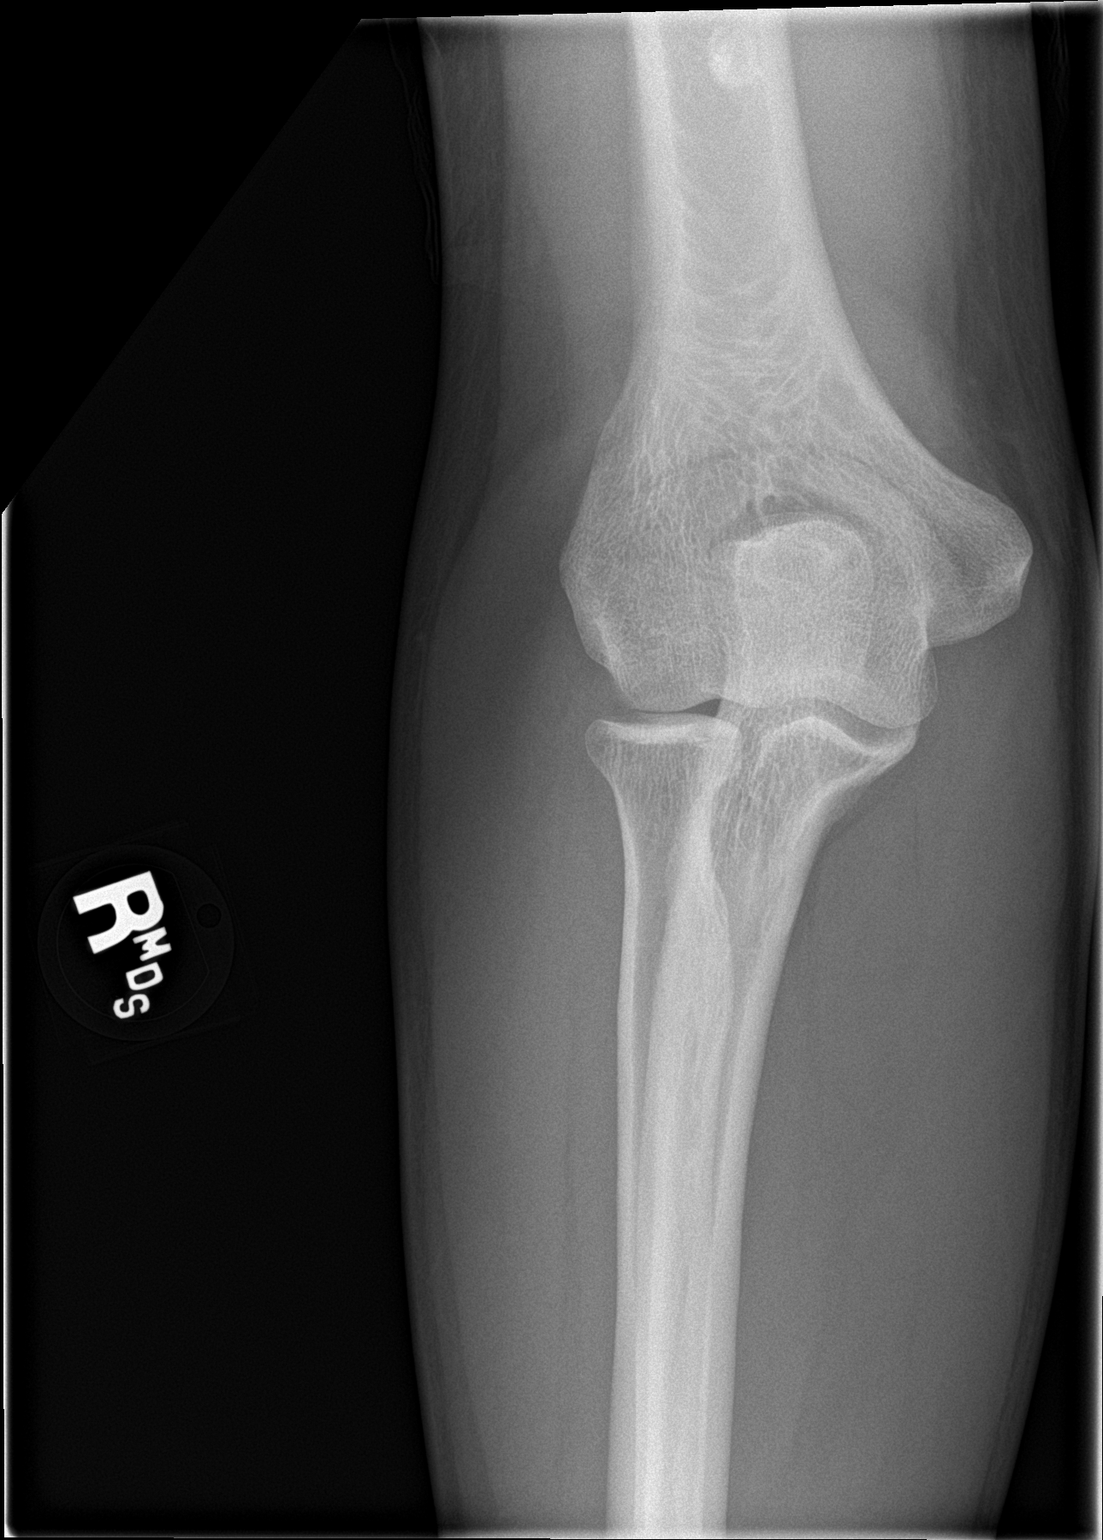

[elbow lat]
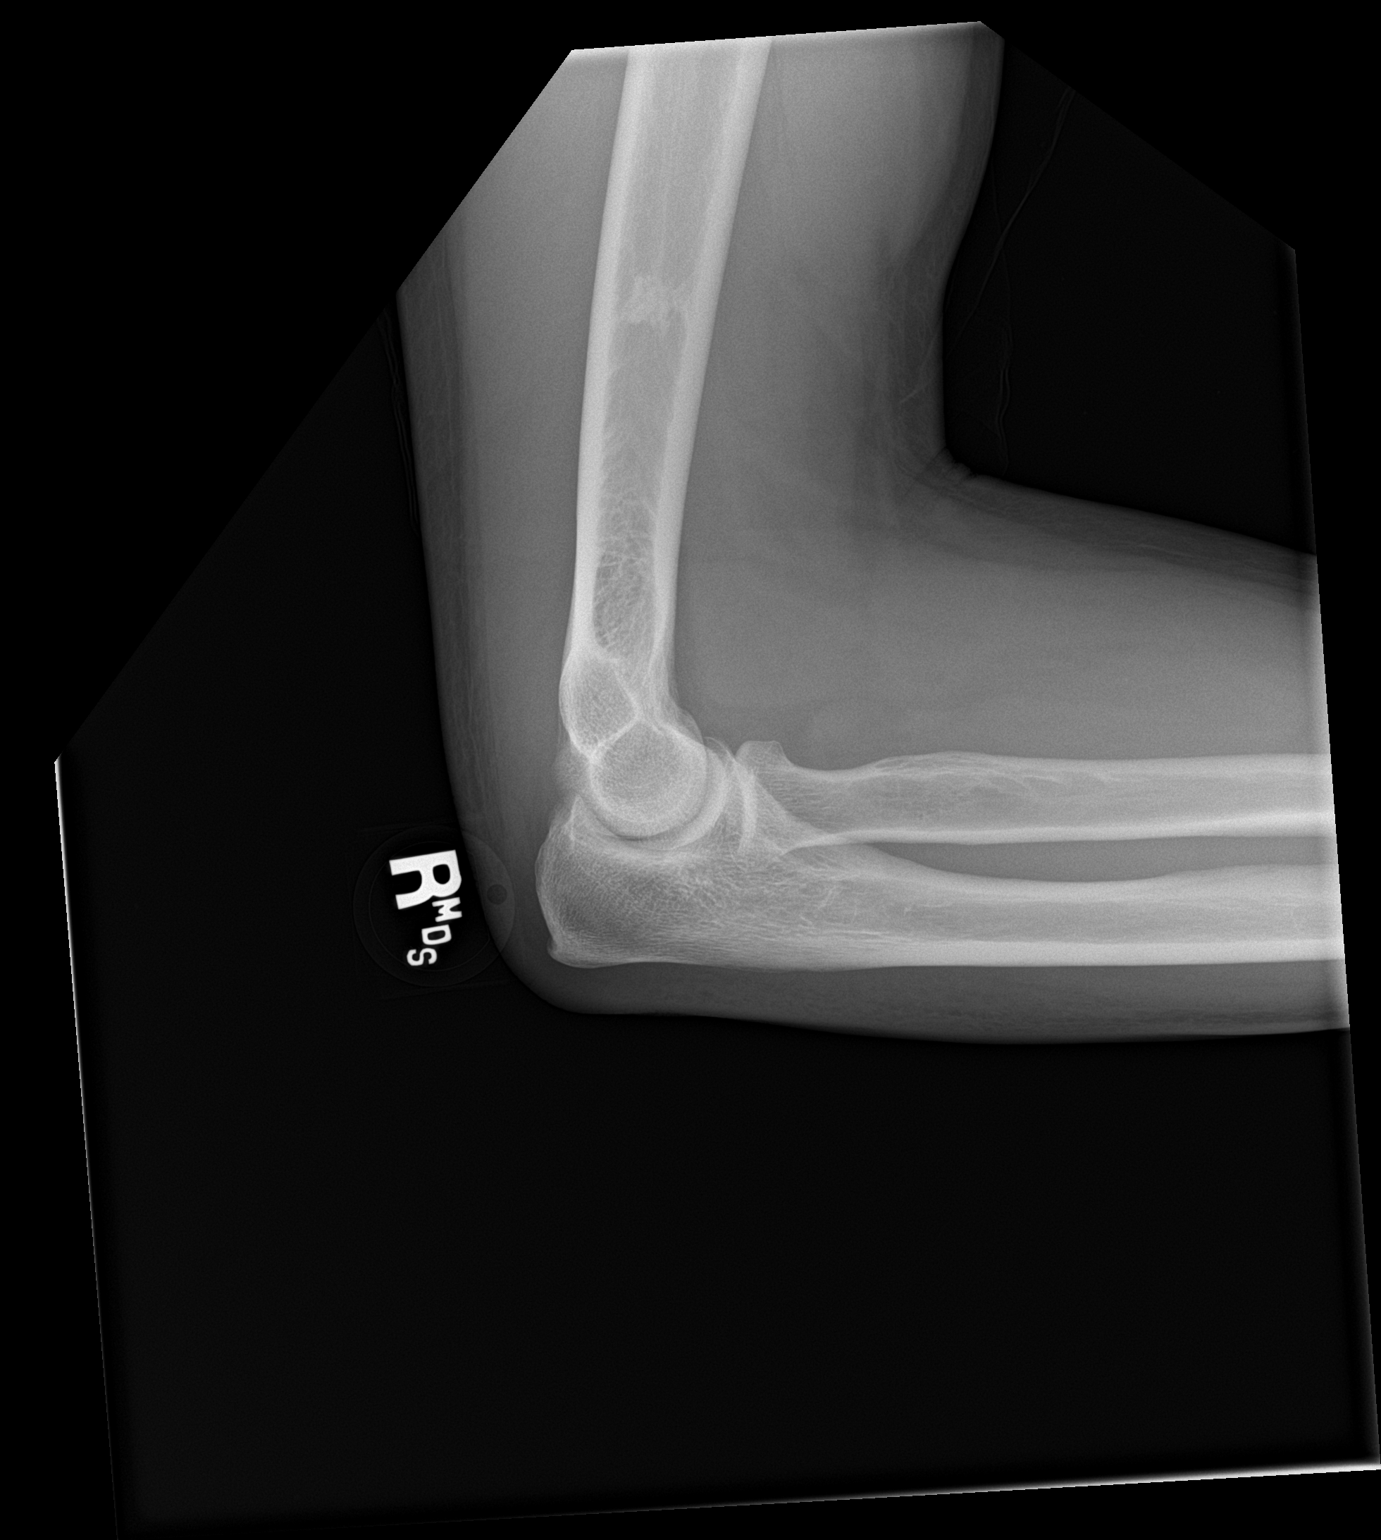

[2 of 2 positions shown; findings below may reference images not displayed]

FINDINGS: There is no evidence of fracture, dislocation, or joint effusion.
There is no evidence of arthropathy or other focal bone abnormality.
Soft tissues are unremarkable.
IMPRESSION: Negative.

## 2019-12-26 ENCOUNTER — Other Ambulatory Visit: Payer: Self-pay

## 2019-12-26 ENCOUNTER — Emergency Department (HOSPITAL_COMMUNITY)
Admission: EM | Admit: 2019-12-26 | Discharge: 2019-12-26 | Disposition: A | Discharge status: Home / Self Care | Payer: Medicaid Other | Attending: Emergency Medicine | Admitting: Emergency Medicine

## 2019-12-26 DIAGNOSIS — F17210 Nicotine dependence, cigarettes, uncomplicated: Secondary | ICD-10-CM | POA: Insufficient documentation

## 2019-12-26 DIAGNOSIS — F319 Bipolar disorder, unspecified: Secondary | ICD-10-CM | POA: Insufficient documentation

## 2019-12-26 DIAGNOSIS — Z79899 Other long term (current) drug therapy: Secondary | ICD-10-CM | POA: Insufficient documentation

## 2019-12-26 DIAGNOSIS — E119 Type 2 diabetes mellitus without complications: Secondary | ICD-10-CM | POA: Insufficient documentation

## 2019-12-26 DIAGNOSIS — G8929 Other chronic pain: Secondary | ICD-10-CM | POA: Insufficient documentation

## 2019-12-26 DIAGNOSIS — Z794 Long term (current) use of insulin: Secondary | ICD-10-CM | POA: Insufficient documentation

## 2019-12-26 DIAGNOSIS — Z760 Encounter for issue of repeat prescription: Secondary | ICD-10-CM | POA: Insufficient documentation

## 2019-12-26 LAB — CBG MONITORING, ED: Glucose-Capillary: 200 mg/dL — ABNORMAL HIGH (ref 70–99)

## 2019-12-26 MED ORDER — INSULIN ASPART 100 UNIT/ML ~~LOC~~ SOLN: 0.0000 [IU] | Freq: Three times a day (TID) | SUBCUTANEOUS | 0 refills | Status: DC

## 2019-12-26 MED ORDER — INSULIN PEN NEEDLE 30G X 8 MM MISC: 1.0000 | 0 refills | Status: DC | PRN

## 2019-12-26 MED ORDER — BLOOD GLUCOSE MONITOR KIT: PACK | 0 refills | Status: DC

## 2019-12-26 MED ORDER — INSULIN ASPART 100 UNIT/ML ~~LOC~~ SOLN: 5.0000 [IU] | Freq: Three times a day (TID) | SUBCUTANEOUS | 11 refills | Status: DC

## 2019-12-26 MED ORDER — INSULIN GLARGINE 100 UNIT/ML ~~LOC~~ SOLN: 25.0000 [IU] | Freq: Every day | SUBCUTANEOUS | 0 refills | Status: DC

## 2019-12-26 MED ORDER — PREGABALIN 150 MG PO CAPS: 150.0000 mg | ORAL_CAPSULE | Freq: Two times a day (BID) | ORAL | 0 refills | Status: DC

## 2019-12-26 NOTE — Progress Notes (Signed)
  MATCH Medication Assistance Card  Name: [name] (MRN): [id number] [facility]: [id number] RX Group: [id number]  Discharge Date:12/26/2019 Expiration Date:01/03/2020  (must be filled within 7 days of discharge)     Dear Mark [name]:  You have been approved to have the prescriptions written by your discharging physician filled through our [facility] (Medication Assistance Through [facility]) program. This program allows for a one-time (no refills) 34-day supply of selected medications for a low copay amount.  The copay is $3.00 per prescription. For instance, if you have one prescription, you will pay $3.00; for two prescriptions, you pay $6.00; for three prescriptions, you pay $9.00; and so on.  Only certain pharmacies are participating in this program with [facility]. You will need to select one of the pharmacies from the attached list and take your prescriptions, this letter, and your photo ID to one of the participating pharmacies.   We are excited that you are able to use the [facility] program to get your medications. These prescriptions must be filled within 7 days of hospital discharge or they will no longer be valid for the [facility] program. Should you have any problems with your prescriptions please contact your case management team member at [phone] or [phone] for /[place].   Thank you,    [facility]

## 2019-12-26 NOTE — Progress Notes (Signed)
TOC CM faxed MATCH to [facility] pharmacy. Pt will have a $3 copay. His Novolog was covered under MATCH. Lyrica was not covered. Made ED provider aware and will have pt pick up a [facility]. Will call pt on Monday, 12/28/2019  with information on appt. Isidoro Donning RN CCM, [facility] ED TOC CM [phone]

## 2019-12-26 NOTE — Progress Notes (Signed)
CSW spoke with [facility] regarding consult and will provide [facility] letter if he has not received MATCH in last year to be up prescription. CSW notes patient provided the following phone number for follow-up [phone]. CSW reviewed MATCH with patient and noted he had no questions about it and understood.

## 2019-12-26 NOTE — Discharge Instructions (Addendum)
Follow up with PCP for reevaluation. As discussed, Novolog prescription has been sent to your pharmacy. You will be called on Monday by the hospital about your Gabapentin prescription and appointment. Return to the ER for new or worsening symptoms.

## 2019-12-26 NOTE — ED Provider Notes (Signed)
Care assumed from [facility], PA-C awaiting for case management for financial assistance with insulin prescription  Spoke to [facility] who have sent his Novolog prescription to his pharmacy through [facility] program. Unable to send Gabapentin because his 500 dollar limit was met. Discussed this with patient. Mark [name] is going to call Monday to see if prescription can be sent to [facility] and schedule an appointment for patient. Patient is agreeable to plan.   Strict ED precautions discussed with patient. Patient states understanding and agrees to plan. Patient discharged home in no acute distress and stable vitals    Karie Kirks 12/26/19 1815    Elnora Morrison, MD 12/28/19 [phone]

## 2019-12-26 NOTE — ED Triage Notes (Addendum)
Per [facility] pt is homeless reports he is out of his insulin and gabapentin x2 days, EMS reports 176/108 not currently taking medications for HTN   HR 98 CBG 172 RR 16 96% RA

## 2019-12-26 NOTE — ED Provider Notes (Signed)
[facility]ERGENCY DEPARTMENT Provider Note   CSN: [id number] Arrival date & time: 12/26/19  1346     History Chief Complaint  Patient presents with  . Medication Refill    Mark [name] is a 34 y.o. male with patient with history of bipolar disorder, diabetes, on insulin, chronic pain from diabetic neuropathy presents for evaluation of needing a prescription.  Patient has been out of his insulin for the last 2 days.  States he is unable to fill his prescription.  Patient states he normally goes up to the pharmacy at [facility] and they prescribe him insulin however he has been unable to afford this recently. Denies fever, chills, N,V, pain, shortness of breath, polyuria, polydipsia, abdominal pain, diarrhea, dysuria, tremors.  No additional aggravating alleviating factors.  History obtained from patient and past medical records.  No interpreter is used.  HPI     Past Medical History:  Diagnosis Date  . Depression   . Diabetes mellitus without complication ([place])   . Hydrocephalus   . Kidney stones     Patient Active Problem List   Diagnosis Date Noted  . Suicidal ideation   . Bipolar I disorder, most recent episode depressed, severe without psychotic features ([place])   . Adjustment disorder with depressed mood 12/07/2017  . Noncompliance 10/17/2017  . Dysthymia 10/08/2017  . Personality disorder in adult [facility]) 09/26/2017  . 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 Obstructive hydrocephalus ([place]) 09/19/2017  . Diabetes ([place]) 09/11/2017  . Hypoglycemia 09/09/2017  . MDD (major depressive disorder) 08/22/2017  . Severe recurrent major depression with psychotic features ([place]) 08/19/2017  . Nausea & vomiting 08/18/2017  . Abdominal pain 08/18/2017  . Hyponatremia 08/18/2017  . DKA (diabetic ketoacidoses) ([place]) 11/24/2015  . Diabetic keto-acidosis ([place]) 10/23/2015    Past Surgical History:  Procedure Laterality Date  . KIDNEY STONE SURGERY         Family  History  Problem Relation Age of Onset  . Breast cancer Mother     Social History   Tobacco Use  . Smoking status: Current Every Day Smoker    Packs/day: 0.75    Years: 20.00    Pack years: 15.00    Types: Cigarettes  . Smokeless tobacco: Never Used  Substance Use Topics  . Alcohol use: Never    Comment: occassional   . Drug use: Yes    Types: Marijuana    Comment: smoked 2-3 days ago    Home Medications Prior to Admission medications   Medication Sig Start Date End Date Taking? Authorizing Provider  acetaminophen (TYLENOL) 325 MG tablet Take 1 tablet (325 mg total) by mouth every 6 (six) hours as needed for mild pain (or Fever >/= 101). 06/04/18   Nicholes Mango, MD  blood glucose meter kit and supplies KIT Dispense based on patient and insurance preference. Use up to four times daily as directed. (FOR ICD-9 250.00, 250.01). 12/26/19   Henderly, Britni A, PA-C  divalproex (DEPAKOTE) 500 MG DR tablet Take 1 tablet (500 mg total) by mouth every 12 (twelve) hours. 06/04/18   Gouru, Illene Silver, MD  escitalopram (LEXAPRO) 10 MG tablet Take 1 tablet (10 mg total) by mouth daily. 06/05/18   Gouru, Illene Silver, MD  insulin aspart (NOVOLOG) 100 UNIT/ML injection Inject 5 Units into the skin 3 (three) times daily with meals. 12/26/19   Henderly, Britni A, PA-C  insulin aspart (NOVOLOG) 100 UNIT/ML injection Inject 0-9 Units into the skin 3 (three) times daily with meals. CBG <  70: implement hypoglycemia protocol CBG 70 - 120: 0 units CBG 121 - 150: 1 unit CBG 151 - 200: 2 units CBG 201 - 250: 3 units CBG 251 - 300: 5 units CBG 301 - 350: 7 units CBG 351 - 400: 9 units CBG > 400: call MD and obtain STAT lab verification, Do NOT hold if patient is NPO. Sensitive Scale. 12/26/19   Henderly, Britni A, PA-C  insulin glargine (LANTUS) 100 UNIT/ML injection Inject 0.25 mLs (25 Units total) into the skin daily. 12/26/19   Henderly, Britni A, PA-C  Insulin Pen Needle (NOVOFINE) 30G X 8 MM MISC Inject 10 each into the skin as  needed. 12/26/19   Henderly, Britni A, PA-C  levothyroxine (SYNTHROID, LEVOTHROID) 100 MCG tablet Take 100 mcg by mouth daily before breakfast.    [provider]  pregabalin (LYRICA) 150 MG capsule Take 1 capsule (150 mg total) by mouth 2 (two) times daily. 12/26/19   Henderly, Britni A, PA-C  QUEtiapine (SEROQUEL) 100 MG tablet Take 1 tablet (100 mg total) by mouth at bedtime. 06/04/18   Nicholes Mango, MD    Allergies    Mushroom extract complex, Penicillins, Sulfa antibiotics, and Clindamycin/lincomycin  Review of Systems   Review of Systems  Constitutional: Negative.   HENT: Negative.   Respiratory: Negative.   Cardiovascular: Negative.   Gastrointestinal: Negative.   Genitourinary: Negative.   Musculoskeletal: Negative.   Skin: Negative.   Neurological: Negative.   All other systems reviewed and are negative.   Physical Exam Updated Vital Signs BP 129/74 (BP Location: Left Arm)   Pulse 88   Temp 97.7 F (36.5 C) (Oral)   Resp 20   SpO2 99%   Physical Exam Vitals and nursing note reviewed.  Constitutional:      General: He is not in acute distress.    Appearance: He is well-developed. He is not ill-appearing, toxic-appearing or diaphoretic.     Comments: Disheveled  HENT:     Head: Normocephalic and atraumatic.     Nose: Nose normal.     Mouth/Throat:     Mouth: Mucous membranes are dry.  Eyes:     Pupils: Pupils are equal, round, and reactive to light.  Cardiovascular:     Rate and Rhythm: Normal rate and regular rhythm.     Pulses: Normal pulses.     Heart sounds: Normal heart sounds.  Pulmonary:     Effort: Pulmonary effort is normal. No respiratory distress.     Breath sounds: Normal breath sounds.  Abdominal:     General: Bowel sounds are normal. There is no distension.     Palpations: Abdomen is soft.  Musculoskeletal:        General: Normal range of motion.     Cervical back: Normal range of motion and neck supple.  Skin:    General: Skin is  warm and dry.  Neurological:     General: No focal deficit present.     Mental Status: He is alert and oriented to person, place, and time.    ED Results / Procedures / Treatments   Labs (all labs ordered are listed, but only abnormal results are displayed) Labs Reviewed  CBG MONITORING, ED - Abnormal; Notable for the following components:      Result Value   Glucose-Capillary 200 (*)    All other components within normal limits    EKG None  Radiology No results found.  Procedures Procedures (including critical care time)  Medications Ordered in  ED Medications - No data to display  ED Course  I have reviewed the triage vital signs and the nursing notes.  Pertinent labs & imaging results that were available during my care of the patient were reviewed by me and considered in my medical decision making (see chart for details).  34 year old for evaluation medication refill.  He has been out of his insulin x2 days.  CBG here in ED 200.  Low suspicion for DKA.  No polyuria, polydipsia, tremor, no clues, breathing on exam.  Also out of his gabapentin which he uses for his chronic neuropathy pain.  Does not appear septic or ill.  Tolerating p.o. intake.  Moves all 4 extremities at difficulty.  We will plan to consult with TOC for assistance with paying for medications.  I have sent in his new prescriptions to [facility] which is the closest pharmacy that patient can pick meds up at.  Care transferred to [facility] who will follow up on [facility] management for patients home meds.  Clinical Course as of Dec 25 1600  Sat Dec 26, 2019  1558 Glucose-Capillary(!): 200 [CA]    Clinical Course User Index [CA] Karie Kirks   MDM Rules/Calculators/A&P                       Final Clinical Impression(s) / ED Diagnoses Final diagnoses:  Medication refill    Rx / DC Orders ED Discharge Orders         Ordered    blood glucose meter kit and supplies KIT     12/26/19 1507     insulin aspart (NOVOLOG) 100 UNIT/ML injection  3 times daily with meals,   Status:  Discontinued     12/26/19 1507    insulin aspart (NOVOLOG) 100 UNIT/ML injection  3 times daily with meals     12/26/19 1507    insulin glargine (LANTUS) 100 UNIT/ML injection  Daily     12/26/19 1507    Insulin Pen Needle (NOVOFINE) 30G X 8 MM MISC  As needed     12/26/19 1507    pregabalin (LYRICA) 150 MG capsule  2 times daily     12/26/19 1507    insulin aspart (NOVOLOG) 100 UNIT/ML injection  3 times daily with meals     12/26/19 1518           Henderly, Britni A, PA-C 12/26/19 1602    Blanchie Dessert, MD 12/27/19 [phone]

## 2019-12-28 NOTE — Progress Notes (Addendum)
12/28/2019 TOC CM attempted call to pt number provide to reach to schedule follow up appt. No answer and unable to leave voice mail. ED provider updated. Isidoro Donning RN CCM, [facility] ED TOC CM [phone]

## 2019-12-29 ENCOUNTER — Other Ambulatory Visit: Payer: Self-pay

## 2019-12-29 ENCOUNTER — Encounter (HOSPITAL_COMMUNITY): Payer: Self-pay | Admitting: Emergency Medicine

## 2019-12-29 ENCOUNTER — Inpatient Hospital Stay (HOSPITAL_COMMUNITY)
Admission: EM | Admit: 2019-12-29 | Discharge: 2019-12-31 | DRG: 638 | Disposition: A | Discharge status: Home / Self Care | Payer: Self-pay | Attending: Internal Medicine | Admitting: Internal Medicine

## 2019-12-29 DIAGNOSIS — Z87442 Personal history of urinary calculi: Secondary | ICD-10-CM

## 2019-12-29 DIAGNOSIS — Z794 Long term (current) use of insulin: Secondary | ICD-10-CM

## 2019-12-29 DIAGNOSIS — E1110 Type 2 diabetes mellitus with ketoacidosis without coma: Principal | ICD-10-CM | POA: Diagnosis present

## 2019-12-29 DIAGNOSIS — E1065 Type 1 diabetes mellitus with hyperglycemia: Secondary | ICD-10-CM | POA: Diagnosis present

## 2019-12-29 DIAGNOSIS — E039 Hypothyroidism, unspecified: Secondary | ICD-10-CM | POA: Diagnosis present

## 2019-12-29 DIAGNOSIS — Z79890 Hormone replacement therapy (postmenopausal): Secondary | ICD-10-CM

## 2019-12-29 DIAGNOSIS — Z20822 Contact with and (suspected) exposure to covid-19: Secondary | ICD-10-CM | POA: Diagnosis present

## 2019-12-29 DIAGNOSIS — F1210 Cannabis abuse, uncomplicated: Secondary | ICD-10-CM | POA: Diagnosis present

## 2019-12-29 DIAGNOSIS — G919 Hydrocephalus, unspecified: Secondary | ICD-10-CM | POA: Diagnosis present

## 2019-12-29 DIAGNOSIS — F17210 Nicotine dependence, cigarettes, uncomplicated: Secondary | ICD-10-CM | POA: Diagnosis present

## 2019-12-29 DIAGNOSIS — E1069 Type 1 diabetes mellitus with other specified complication: Secondary | ICD-10-CM | POA: Diagnosis present

## 2019-12-29 DIAGNOSIS — T383X6A Underdosing of insulin and oral hypoglycemic [antidiabetic] drugs, initial encounter: Secondary | ICD-10-CM | POA: Diagnosis present

## 2019-12-29 DIAGNOSIS — G40909 Epilepsy, unspecified, not intractable, without status epilepticus: Secondary | ICD-10-CM | POA: Diagnosis present

## 2019-12-29 DIAGNOSIS — E870 Hyperosmolality and hypernatremia: Secondary | ICD-10-CM | POA: Diagnosis present

## 2019-12-29 DIAGNOSIS — Z9119 Patient's noncompliance with other medical treatment and regimen: Secondary | ICD-10-CM

## 2019-12-29 DIAGNOSIS — Z5900 Homelessness unspecified: Secondary | ICD-10-CM

## 2019-12-29 DIAGNOSIS — F3130 Bipolar disorder, current episode depressed, mild or moderate severity, unspecified: Secondary | ICD-10-CM | POA: Diagnosis present

## 2019-12-29 DIAGNOSIS — F314 Bipolar disorder, current episode depressed, severe, without psychotic features: Secondary | ICD-10-CM | POA: Diagnosis present

## 2019-12-29 DIAGNOSIS — G9340 Encephalopathy, unspecified: Secondary | ICD-10-CM

## 2019-12-29 DIAGNOSIS — Z91120 Patient's intentional underdosing of medication regimen due to financial hardship: Secondary | ICD-10-CM

## 2019-12-29 DIAGNOSIS — I619 Nontraumatic intracerebral hemorrhage, unspecified: Secondary | ICD-10-CM

## 2019-12-29 DIAGNOSIS — E10649 Type 1 diabetes mellitus with hypoglycemia without coma: Secondary | ICD-10-CM | POA: Diagnosis present

## 2019-12-29 DIAGNOSIS — Z91199 Patient's noncompliance with other medical treatment and regimen due to unspecified reason: Secondary | ICD-10-CM

## 2019-12-29 DIAGNOSIS — F17200 Nicotine dependence, unspecified, uncomplicated: Secondary | ICD-10-CM | POA: Diagnosis present

## 2019-12-29 DIAGNOSIS — Z79899 Other long term (current) drug therapy: Secondary | ICD-10-CM

## 2019-12-29 DIAGNOSIS — E1010 Type 1 diabetes mellitus with ketoacidosis without coma: Secondary | ICD-10-CM

## 2019-12-29 DIAGNOSIS — F1910 Other psychoactive substance abuse, uncomplicated: Secondary | ICD-10-CM | POA: Diagnosis present

## 2019-12-29 HISTORY — DX: Unspecified convulsions: R56.9

## 2019-12-29 LAB — CBG MONITORING, ED
Glucose-Capillary: >600 mg/dL (ref 70–99)
Glucose-Capillary: 502 mg/dL (ref 70–99)
Glucose-Capillary: 468 mg/dL — ABNORMAL HIGH (ref 70–99)
Glucose-Capillary: 398 mg/dL — ABNORMAL HIGH (ref 70–99)
Glucose-Capillary: 231 mg/dL — ABNORMAL HIGH (ref 70–99)
Glucose-Capillary: 180 mg/dL — ABNORMAL HIGH (ref 70–99)
Glucose-Capillary: 153 mg/dL — ABNORMAL HIGH (ref 70–99)
Glucose-Capillary: 151 mg/dL — ABNORMAL HIGH (ref 70–99)
Glucose-Capillary: 149 mg/dL — ABNORMAL HIGH (ref 70–99)

## 2019-12-29 LAB — CBC WITH DIFFERENTIAL/PLATELET
Abs Immature Granulocytes: 0.08 10*3/uL — ABNORMAL HIGH (ref 0.00–0.07)
Basophils Absolute: 0.1 10*3/uL (ref 0.0–0.1)
Basophils Relative: 0 %
Eosinophils Absolute: 0 10*3/uL (ref 0.0–0.5)
Eosinophils Relative: 0 %
HCT: 38.7 % — ABNORMAL LOW (ref 39.0–52.0)
Hemoglobin: 12.1 g/dL — ABNORMAL LOW (ref 13.0–17.0)
Immature Granulocytes: 1 %
Lymphocytes Relative: 7 %
Lymphs Abs: 1 10*3/uL (ref 0.7–4.0)
MCH: 30 pg (ref 26.0–34.0)
MCHC: 31.3 g/dL (ref 30.0–36.0)
MCV: 95.8 fL (ref 80.0–100.0)
Monocytes Absolute: 0.4 10*3/uL (ref 0.1–1.0)
Monocytes Relative: 3 %
Neutro Abs: 11.9 10*3/uL — ABNORMAL HIGH (ref 1.7–7.7)
Neutrophils Relative %: 89 %
Platelets: 389 10*3/uL (ref 150–400)
RBC: 4.04 MIL/uL — ABNORMAL LOW (ref 4.22–5.81)
RDW: 13 % (ref 11.5–15.5)
WBC: 13.4 10*3/uL — ABNORMAL HIGH (ref 4.0–10.5)
nRBC: 0 % (ref 0.0–0.2)

## 2019-12-29 LAB — RAPID URINE DRUG SCREEN, HOSP PERFORMED
Amphetamines: NOT DETECTED
Barbiturates: NOT DETECTED
Benzodiazepines: NOT DETECTED
Cocaine: NOT DETECTED
Opiates: NOT DETECTED
Tetrahydrocannabinol: POSITIVE — AB

## 2019-12-29 LAB — URINALYSIS, ROUTINE W REFLEX MICROSCOPIC
Bacteria, UA: NONE SEEN
Bilirubin Urine: NEGATIVE
Glucose, UA: >=500 mg/dL — AB
Hgb urine dipstick: NEGATIVE
Ketones, ur: 80 mg/dL — AB
Leukocytes,Ua: NEGATIVE
Nitrite: NEGATIVE
Protein, ur: NEGATIVE mg/dL
Specific Gravity, Urine: 1.025 (ref 1.005–1.030)
pH: 5 (ref 5.0–8.0)

## 2019-12-29 LAB — COMPREHENSIVE METABOLIC PANEL
ALT: 19 U/L (ref 0–44)
AST: 26 U/L (ref 15–41)
Albumin: 3.6 g/dL (ref 3.5–5.0)
Alkaline Phosphatase: 92 U/L (ref 38–126)
Anion gap: 24 — ABNORMAL HIGH (ref 5–15)
BUN: 16 mg/dL (ref 6–20)
CO2: 12 mmol/L — ABNORMAL LOW (ref 22–32)
Calcium: 8.7 mg/dL — ABNORMAL LOW (ref 8.9–10.3)
Chloride: 99 mmol/L (ref 98–111)
Creatinine, Ser: 1.78 mg/dL — ABNORMAL HIGH (ref 0.61–1.24)
GFR calc Af Amer: 56 mL/min — ABNORMAL LOW (ref >60)
GFR calc non Af Amer: 48 mL/min — ABNORMAL LOW (ref >60)
Glucose, Bld: 736 mg/dL (ref 70–99)
Potassium: 5.5 mmol/L — ABNORMAL HIGH (ref 3.5–5.1)
Sodium: 135 mmol/L (ref 135–145)
Total Bilirubin: 2 mg/dL — ABNORMAL HIGH (ref 0.3–1.2)
Total Protein: 6.5 g/dL (ref 6.5–8.1)

## 2019-12-29 LAB — BASIC METABOLIC PANEL
Anion gap: 17 — ABNORMAL HIGH (ref 5–15)
Anion gap: 14 (ref 5–15)
BUN: 15 mg/dL (ref 6–20)
BUN: 15 mg/dL (ref 6–20)
CO2: 14 mmol/L — ABNORMAL LOW (ref 22–32)
CO2: 16 mmol/L — ABNORMAL LOW (ref 22–32)
Calcium: 8.7 mg/dL — ABNORMAL LOW (ref 8.9–10.3)
Calcium: 8.7 mg/dL — ABNORMAL LOW (ref 8.9–10.3)
Chloride: 105 mmol/L (ref 98–111)
Chloride: 107 mmol/L (ref 98–111)
Creatinine, Ser: 1.57 mg/dL — ABNORMAL HIGH (ref 0.61–1.24)
Creatinine, Ser: 1.33 mg/dL — ABNORMAL HIGH (ref 0.61–1.24)
GFR calc Af Amer: >60 mL/min (ref >60)
GFR calc Af Amer: >60 mL/min (ref >60)
GFR calc non Af Amer: 56 mL/min — ABNORMAL LOW (ref >60)
GFR calc non Af Amer: >60 mL/min (ref >60)
Glucose, Bld: 254 mg/dL — ABNORMAL HIGH (ref 70–99)
Glucose, Bld: 163 mg/dL — ABNORMAL HIGH (ref 70–99)
Potassium: 4.2 mmol/L (ref 3.5–5.1)
Potassium: 4.3 mmol/L (ref 3.5–5.1)
Sodium: 136 mmol/L (ref 135–145)
Sodium: 137 mmol/L (ref 135–145)

## 2019-12-29 LAB — HEMOGLOBIN A1C
Hgb A1c MFr Bld: 8.4 % — ABNORMAL HIGH (ref 4.8–5.6)
Mean Plasma Glucose: 194.38 mg/dL

## 2019-12-29 LAB — VALPROIC ACID LEVEL: Valproic Acid Lvl: <10 ug/mL — ABNORMAL LOW (ref 50.0–100.0)

## 2019-12-29 LAB — BETA-HYDROXYBUTYRIC ACID: Beta-Hydroxybutyric Acid: 5.91 mmol/L — ABNORMAL HIGH (ref 0.05–0.27)

## 2019-12-29 LAB — HIV ANTIBODY (ROUTINE TESTING W REFLEX): HIV Screen 4th Generation wRfx: NONREACTIVE

## 2019-12-29 LAB — POCT I-STAT EG7
Acid-base deficit: 13 mmol/L — ABNORMAL HIGH (ref 0.0–2.0)
Bicarbonate: 11.7 mmol/L — ABNORMAL LOW (ref 20.0–28.0)
Calcium, Ion: 1.07 mmol/L — ABNORMAL LOW (ref 1.15–1.40)
HCT: 38 % — ABNORMAL LOW (ref 39.0–52.0)
Hemoglobin: 12.9 g/dL — ABNORMAL LOW (ref 13.0–17.0)
O2 Saturation: 97 %
Potassium: 5.4 mmol/L — ABNORMAL HIGH (ref 3.5–5.1)
Sodium: 133 mmol/L — ABNORMAL LOW (ref 135–145)
TCO2: 12 mmol/L — ABNORMAL LOW (ref 22–32)
pCO2, Ven: 22.9 mmHg — ABNORMAL LOW (ref 44.0–60.0)
pH, Ven: 7.317 (ref 7.250–7.430)
pO2, Ven: 91 mmHg — ABNORMAL HIGH (ref 32.0–45.0)

## 2019-12-29 LAB — TSH: TSH: 6.46 u[IU]/mL — ABNORMAL HIGH (ref 0.350–4.500)

## 2019-12-29 MED ORDER — ESCITALOPRAM OXALATE 10 MG PO TABS
10.0000 mg | ORAL_TABLET | Freq: Every day | ORAL | Status: DC
  Administered 2019-12-29 – 2019-12-31 (×3): 10 mg via ORAL
  Filled 2019-12-29 (×3): qty 1

## 2019-12-29 MED ORDER — SODIUM CHLORIDE 0.9 % IV BOLUS
1000.0000 mL | INTRAVENOUS | Status: DC
  Administered 2019-12-29: 1000 mL via INTRAVENOUS

## 2019-12-29 MED ORDER — DEXTROSE 50 % IV SOLN: 0.0000 mL | INTRAVENOUS | Status: DC | PRN

## 2019-12-29 MED ORDER — INSULIN ASPART 100 UNIT/ML ~~LOC~~ SOLN
6.0000 [IU] | Freq: Once | SUBCUTANEOUS | Status: AC
  Administered 2019-12-29: 6 [IU] via INTRAVENOUS

## 2019-12-29 MED ORDER — PREGABALIN 75 MG PO CAPS
150.0000 mg | ORAL_CAPSULE | Freq: Two times a day (BID) | ORAL | Status: DC
  Administered 2019-12-29 – 2019-12-31 (×4): 150 mg via ORAL
  Filled 2019-12-29: qty 1
  Filled 2019-12-29 (×3): qty 2

## 2019-12-29 MED ORDER — INSULIN REGULAR(HUMAN) IN NACL 100-0.9 UT/100ML-% IV SOLN
INTRAVENOUS | Status: DC
  Administered 2019-12-29: 3.5 [IU]/h via INTRAVENOUS
  Filled 2019-12-29: qty 100

## 2019-12-29 MED ORDER — SODIUM CHLORIDE 0.9 % IV BOLUS: 1000.0000 mL | INTRAVENOUS | Status: AC

## 2019-12-29 MED ORDER — DIVALPROEX SODIUM 500 MG PO DR TAB
500.0000 mg | DELAYED_RELEASE_TABLET | Freq: Two times a day (BID) | ORAL | Status: DC
  Administered 2019-12-29 – 2019-12-31 (×4): 500 mg via ORAL
  Filled 2019-12-29: qty 1
  Filled 2019-12-29: qty 2
  Filled 2019-12-29 (×3): qty 1

## 2019-12-29 MED ORDER — NICOTINE 14 MG/24HR TD PT24
14.0000 mg | MEDICATED_PATCH | Freq: Every day | TRANSDERMAL | Status: DC
  Administered 2019-12-30 – 2019-12-31 (×2): 14 mg via TRANSDERMAL
  Filled 2019-12-29 (×2): qty 1

## 2019-12-29 MED ORDER — QUETIAPINE FUMARATE 100 MG PO TABS
100.0000 mg | ORAL_TABLET | Freq: Every day | ORAL | Status: DC
  Administered 2019-12-29 – 2019-12-30 (×2): 100 mg via ORAL
  Filled 2019-12-29 (×2): qty 1

## 2019-12-29 MED ORDER — DEXTROSE-NACL 5-0.45 % IV SOLN: INTRAVENOUS | Status: DC

## 2019-12-29 MED ORDER — SODIUM CHLORIDE 0.9 % IV SOLN: INTRAVENOUS | Status: DC

## 2019-12-29 MED ORDER — INSULIN REGULAR(HUMAN) IN NACL 100-0.9 UT/100ML-% IV SOLN
INTRAVENOUS | Status: DC
  Administered 2019-12-29: 9.5 [IU]/h via INTRAVENOUS
  Filled 2019-12-29: qty 100

## 2019-12-29 MED ORDER — ENOXAPARIN SODIUM 40 MG/0.4ML ~~LOC~~ SOLN
40.0000 mg | SUBCUTANEOUS | Status: DC
  Administered 2019-12-29 – 2019-12-30 (×2): 40 mg via SUBCUTANEOUS
  Filled 2019-12-29 (×2): qty 0.4

## 2019-12-29 MED ORDER — ONDANSETRON HCL 4 MG/2ML IJ SOLN
4.0000 mg | Freq: Once | INTRAMUSCULAR | Status: AC
  Administered 2019-12-29: 4 mg via INTRAVENOUS
  Filled 2019-12-29: qty 2

## 2019-12-29 MED ORDER — LEVOTHYROXINE SODIUM 100 MCG PO TABS
100.0000 ug | ORAL_TABLET | Freq: Every day | ORAL | Status: DC
  Administered 2019-12-30 – 2019-12-31 (×2): 100 ug via ORAL
  Filled 2019-12-29 (×2): qty 1

## 2019-12-29 MED ORDER — LORAZEPAM 2 MG/ML IJ SOLN
1.0000 mg | Freq: Once | INTRAMUSCULAR | Status: AC
  Administered 2019-12-29: 1 mg via INTRAVENOUS
  Filled 2019-12-29: qty 1

## 2019-12-29 MED ORDER — SODIUM CHLORIDE 0.9 % IV BOLUS
1000.0000 mL | Freq: Once | INTRAVENOUS | Status: AC
  Administered 2019-12-29: 1000 mL via INTRAVENOUS

## 2019-12-29 NOTE — ED Notes (Signed)
Pt bedding changed, pt provided new gown, pt made comfortable, and pt provided warm blankets.

## 2019-12-29 NOTE — H&P (Signed)
History and Physical    Mark [name]:[id number] DOB: 27-Nov-1985 DOA: 12/29/2019  PCP: Patient, No Pcp Per Consultants:  None Patient coming from: Homeless; NOK: Marny Lowenstein, [phone]  Chief Complaint:  hyperglycemia  HPI: KINSTON MAGNAN is a 34 y.o. male with medical history significant of seizures; hydrocephalus; and DM presenting with hyperglycemia since he rad out of insulin.   He was seen in the ER for this issue on 3/6 and was not in DKA; he had Novolog called in under the [facility] program and was referred to [facility].  He did not pick up his insulin.  He reports that he was unable to obtain the insulin for unclear reasons.  He thinks his last insulin was in the ER.  He does smoke and uses marijuana regularly but denies alcohol.  He has not other concerns at this time, but would like to have a drink.   ED Course:  Ran out of meds, vomiting several days, homeless, not eating well.  Mild SOB.  Trending towards DKA, glucose 736.  Started on Endotool.  Review of Systems: As per HPI; otherwise review of systems reviewed and negative.   Ambulatory Status:  Ambulates without assistance  Past Medical History:  Diagnosis Date  . Depression   . Diabetes mellitus without complication ([place])   . Hydrocephalus ([place])   . Kidney stones   . Seizures ([place])     Past Surgical History:  Procedure Laterality Date  . KIDNEY STONE SURGERY      Social History   Socioeconomic History  . Marital status: Single    Spouse name: Not on file  . Number of children: Not on file  . Years of education: Not on file  . Highest education level: Not on file  Occupational History  . Not on file  Tobacco Use  . Smoking status: Current Every Day Smoker    Packs/day: 0.75    Years: 20.00    Pack years: 15.00    Types: Cigarettes  . Smokeless tobacco: Never Used  Substance and Sexual Activity  . Alcohol use: Never    Comment: occassional   . Drug use: Yes    Types: Marijuana   Comment: smoked 2-3 days ago  . Sexual activity: Not on file  Other Topics Concern  . Not on file  Social History Narrative  . Not on file   Social Determinants of Health   Financial Resource Strain:   . Difficulty of Paying Living Expenses: Not on file  Food Insecurity:   . Worried About Charity fundraiser in the Last Year: Not on file  . Ran Out of Food in the Last Year: Not on file  Transportation Needs:   . Lack of Transportation (Medical): Not on file  . Lack of Transportation (Non-Medical): Not on file  Physical Activity:   . Days of Exercise per Week: Not on file  . Minutes of Exercise per Session: Not on file  Stress:   . Feeling of Stress : Not on file  Social Connections:   . Frequency of Communication with Friends and Family: Not on file  . Frequency of Social Gatherings with Friends and Family: Not on file  . Attends Religious Services: Not on file  . Active Member of Clubs or Organizations: Not on file  . Attends Archivist Meetings: Not on file  . Marital Status: Not on file  Intimate Partner Violence:   . Fear of Current or Ex-Partner: Not on file  .  Emotionally Abused: Not on file  . Physically Abused: Not on file  . Sexually Abused: Not on file    Allergies  Allergen Reactions  . Mushroom Extract Complex Hives, Itching and Nausea And Vomiting  . Penicillins Anaphylaxis, Hives and Swelling    Has patient had a PCN reaction causing immediate rash, facial/tongue/throat swelling, SOB or lightheadedness with hypotension: Yes Has patient had a PCN reaction causing severe rash involving mucus membranes or skin necrosis: No Has patient had a PCN reaction that required hospitalization Yes Has patient had a PCN reaction occurring within the last 10 years: Yes If all of the above answers are "NO", then may proceed with Cephalosporin use.  . Sulfa Antibiotics Anaphylaxis and Hives  . Clindamycin/Lincomycin Hives    Family History  Problem Relation Age  of Onset  . Breast cancer Mother     Prior to Admission medications   Medication Sig Start Date End Date Taking? Authorizing Provider  acetaminophen (TYLENOL) 325 MG tablet Take 1 tablet (325 mg total) by mouth every 6 (six) hours as needed for mild pain (or Fever >/= 101). 06/04/18   Nicholes Mango, MD  blood glucose meter kit and supplies KIT Dispense based on patient and insurance preference. Use up to four times daily as directed. (FOR ICD-9 250.00, 250.01). 12/26/19   Henderly, Britni A, PA-C  divalproex (DEPAKOTE) 500 MG DR tablet Take 1 tablet (500 mg total) by mouth every 12 (twelve) hours. 06/04/18   Gouru, Illene Silver, MD  escitalopram (LEXAPRO) 10 MG tablet Take 1 tablet (10 mg total) by mouth daily. 06/05/18   Gouru, Illene Silver, MD  insulin aspart (NOVOLOG) 100 UNIT/ML injection Inject 5 Units into the skin 3 (three) times daily with meals. 12/26/19   Henderly, Britni A, PA-C  insulin aspart (NOVOLOG) 100 UNIT/ML injection Inject 0-9 Units into the skin 3 (three) times daily with meals. CBG < 70: implement hypoglycemia protocol CBG 70 - 120: 0 units CBG 121 - 150: 1 unit CBG 151 - 200: 2 units CBG 201 - 250: 3 units CBG 251 - 300: 5 units CBG 301 - 350: 7 units CBG 351 - 400: 9 units CBG > 400: call MD and obtain STAT lab verification, Do NOT hold if patient is NPO. Sensitive Scale. 12/26/19   Henderly, Britni A, PA-C  insulin glargine (LANTUS) 100 UNIT/ML injection Inject 0.25 mLs (25 Units total) into the skin daily. 12/26/19   Henderly, Britni A, PA-C  Insulin Pen Needle (NOVOFINE) 30G X 8 MM MISC Inject 10 each into the skin as needed. 12/26/19   Henderly, Britni A, PA-C  levothyroxine (SYNTHROID, LEVOTHROID) 100 MCG tablet Take 100 mcg by mouth daily before breakfast.    [provider]  pregabalin (LYRICA) 150 MG capsule Take 1 capsule (150 mg total) by mouth 2 (two) times daily. 12/26/19   Henderly, Britni A, PA-C  QUEtiapine (SEROQUEL) 100 MG tablet Take 1 tablet (100 mg total) by mouth at bedtime.  06/04/18   Nicholes Mango, MD    Physical Exam: Vitals:   12/29/19 1157 12/29/19 1159  BP:  123/67  Pulse:  (!) 106  Resp:  (!) 31  Temp:  98.9 F (37.2 C)  TempSrc:  Oral  SpO2:  96%  Weight: 113.4 kg   Height: 6' 2"  (1.88 m)      . General:  Appears calm and comfortable and is NAD, disheveled and unkempt . Eyes:  PERRL, EOMI, normal lids, iris . ENT:  grossly normal hearing, lips &  tongue, mmm; poor dentition . Neck:  no LAD, masses or thyromegaly . Cardiovascular:  RR with mild tachycardia, no m/r/g. No LE edema.  Marland Kitchen Respiratory:   CTA bilaterally with no wheezes/rales/rhonchi.  Mildly increased respiratory effort. . Abdomen:  soft, NT, ND, NABS . Skin:  no rash or induration seen on limited exam . Musculoskeletal:  grossly normal tone BUE/BLE, good ROM, no bony abnormality . Lower extremity:  No LE edema.  Limited foot exam with no ulcerations.  2+ distal pulses. Marland Kitchen Psychiatric:  blunted mood and affect, speech fluent and appropriate, AOx3 . Neurologic:  CN 2-12 grossly intact, moves all extremities in coordinated fashion    Radiological Exams on Admission: No results found.  EKG: Independently reviewed.  Sinus tachycardia with rate 108; no evidence of acute ischemia   Labs on Admission: I have personally reviewed the available labs and imaging studies at the time of the admission.  Pertinent labs:   VBG: 7.317/22.9/91.0/11.7 K+ 5.5 CO2 12 Glucose 736 BUN 16/Creatinine 1.78/GFR 48 Anion gap 24 Bili 2.0 WBC 13.4 Hgb 12.1   Assessment/Plan Principal Problem:   DKA (diabetic ketoacidosis) (HCC) Active Problems:   Noncompliance   Bipolar I disorder, most recent episode depressed, severe without psychotic features ([place])   Homelessness   Marijuana abuse   Tobacco dependence   Seizure disorder ([place])   DKA -Patient with poor baseline control (A1c pending, but prior was 8.3 in 05/2018) -He was seen in the ER on 3/6 and significant effort was made on his behalf  to arrange for outpatient insulin assistance but he did not pick it up -His mild hyperglycemia has since progressed to DKA - normal pH but moderate by anion gap and bicarbonate level -No indication of illness as source -Will admit to SDU with DKA protocol (Endotool) -Would recommend continuing insulin drip at least until morning regardless of rapidity of closure of gap and normalization of labs -K+ slightly increased at time of presentation -IVF at 150 cc/hr, NS until glucose <250 and then decrease rate to 125 and change to D51/2NS -Diabetes coordinator consultation requested  Seizure d/o -Continue Depakote  Bipolar -Continue Lexapro, Depakote, Seroquel  Hypothyroidism -Check TSH -Continue Synthroid at current dose for now  Noncompliance -He was recently in the ER and there was a significant effort to obtain insulin on his behalf but he failed to pick it up -He will need ongoing reinforcement about the importance of compliance  Homelessness -Patient is homeless -[facility] team consult requested  Tobacco dependence -Encourage cessation.   -This was discussed with the patient and should be reviewed on an ongoing basis.   -Patch ordered at patient request.  Marijuana abuse -Cessation encouraged; this should be encouraged on an ongoing basis -UDS ordered   Note: This patient has been tested and is pending for the novel coronavirus COVID-19.  DVT prophylaxis:   Lovenox  Code Status:  Full Family Communication: None present; he did not request that I speak with a family member/friend at the time of admission. Disposition Plan: He is anticipated to d/c to home without [facility] services due to his homelessness, TOC team has been consulted for further assistance. Consults called: DM coordinator; [facility] team Admission status: It is my clinical opinion that referral for OBSERVATION is reasonable and necessary in this patient based on the above information provided. The aforementioned taken together  are felt to place the patient at high risk for further clinical deterioration. However it is anticipated that the patient may be medically stable for  discharge from the hospital within 24 to 48 hours.    Karmen Bongo MD [facility]   How to contact the [facility] Attending or Consulting provider [place] or covering provider during after hours [place], for this patient?  1. Check the care team in [place] and look for a) attending/consulting TRH provider listed and b) the [facility] team listed 2. Log into [email or web address] and use 's universal password to access. If you do not have the password, please contact the hospital operator. 3. Locate the [facility] provider you are looking for under [facility] and page to a number that you can be directly reached. 4. If you still have difficulty reaching the provider, please page the [facility] (Director on Call) for the Hospitalists listed on amion for assistance.   12/29/2019, 4:13 PM

## 2019-12-29 NOTE — ED Provider Notes (Signed)
[facility] EMERGENCY DEPARTMENT Provider Note   CSN: [id number] Arrival date & time: 12/29/19  1151     History Chief Complaint  Patient presents with  . Shortness of Breath  . Emesis  . Dizziness    Mark [name] is a 34 y.o. male.  HPI Patient presents to the emergency department with dizziness and vomiting along with shortness of breath started 3 to 4 days ago.  The patient states has been out of insulin for over 5 days.  Patient states that nothing seems to make his condition better or worse.  Patient states that he has not had any fevers that he is aware of.  The patient denies chest pain, shortness of breath, headache,blurred vision, neck pain, fever, cough, weakness, numbness, dizziness, anorexia, edema, abdominal pain, nausea, vomiting, diarrhea, rash, back pain, dysuria, hematemesis, bloody stool, near syncope, or syncope.  Patient states he has difficulty with getting his medicines due to the fact that he is homeless.    Past Medical History:  Diagnosis Date  . Depression   . Diabetes mellitus without complication ([place])   . Hydrocephalus ([place])   . Kidney stones   . Seizures [facility])     Patient Active Problem List   Diagnosis Date Noted  . Suicidal ideation   . Bipolar I disorder, most recent episode depressed, severe without psychotic features ([place])   . Adjustment disorder with depressed mood 12/07/2017  . Noncompliance 10/17/2017  . Dysthymia 10/08/2017  . Personality disorder in adult [facility]) 09/26/2017  . 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 Obstructive hydrocephalus ([place]) 09/19/2017  . Diabetes ([place]) 09/11/2017  . Hypoglycemia 09/09/2017  . MDD (major depressive disorder) 08/22/2017  . Severe recurrent major depression with psychotic features ([place]) 08/19/2017  . Nausea & vomiting 08/18/2017  . Abdominal pain 08/18/2017  . Hyponatremia 08/18/2017  . DKA (diabetic ketoacidoses) ([place]) 11/24/2015  . Diabetic keto-acidosis ([place]) 10/23/2015    Past  Surgical History:  Procedure Laterality Date  . KIDNEY STONE SURGERY         Family History  Problem Relation Age of Onset  . Breast cancer Mother     Social History   Tobacco Use  . Smoking status: Current Every Day Smoker    Packs/day: 0.75    Years: 20.00    Pack years: 15.00    Types: Cigarettes  . Smokeless tobacco: Never Used  Substance Use Topics  . Alcohol use: Never    Comment: occassional   . Drug use: Yes    Types: Marijuana    Comment: smoked 2-3 days ago    Home Medications Prior to Admission medications   Medication Sig Start Date End Date Taking? Authorizing Provider  acetaminophen (TYLENOL) 325 MG tablet Take 1 tablet (325 mg total) by mouth every 6 (six) hours as needed for mild pain (or Fever >/= 101). 06/04/18   Nicholes Mango, MD  blood glucose meter kit and supplies KIT Dispense based on patient and insurance preference. Use up to four times daily as directed. (FOR ICD-9 250.00, 250.01). 12/26/19   Henderly, Britni A, PA-C  divalproex (DEPAKOTE) 500 MG DR tablet Take 1 tablet (500 mg total) by mouth every 12 (twelve) hours. 06/04/18   Gouru, Illene Silver, MD  escitalopram (LEXAPRO) 10 MG tablet Take 1 tablet (10 mg total) by mouth daily. 06/05/18   Gouru, Illene Silver, MD  insulin aspart (NOVOLOG) 100 UNIT/ML injection Inject 5 Units into the skin 3 (three) times daily with meals. 12/26/19   Henderly, Britni  A, PA-C  insulin aspart (NOVOLOG) 100 UNIT/ML injection Inject 0-9 Units into the skin 3 (three) times daily with meals. CBG < 70: implement hypoglycemia protocol CBG 70 - 120: 0 units CBG 121 - 150: 1 unit CBG 151 - 200: 2 units CBG 201 - 250: 3 units CBG 251 - 300: 5 units CBG 301 - 350: 7 units CBG 351 - 400: 9 units CBG > 400: call MD and obtain STAT lab verification, Do NOT hold if patient is NPO. Sensitive Scale. 12/26/19   Henderly, Britni A, PA-C  insulin glargine (LANTUS) 100 UNIT/ML injection Inject 0.25 mLs (25 Units total) into the skin daily. 12/26/19   Henderly,  Britni A, PA-C  Insulin Pen Needle (NOVOFINE) 30G X 8 MM MISC Inject 10 each into the skin as needed. 12/26/19   Henderly, Britni A, PA-C  levothyroxine (SYNTHROID, LEVOTHROID) 100 MCG tablet Take 100 mcg by mouth daily before breakfast.    [provider]  pregabalin (LYRICA) 150 MG capsule Take 1 capsule (150 mg total) by mouth 2 (two) times daily. 12/26/19   Henderly, Britni A, PA-C  QUEtiapine (SEROQUEL) 100 MG tablet Take 1 tablet (100 mg total) by mouth at bedtime. 06/04/18   Nicholes Mango, MD    Allergies    Mushroom extract complex, Penicillins, Sulfa antibiotics, and Clindamycin/lincomycin  Review of Systems   Review of Systems All other systems negative except as documented in the HPI. All pertinent positives and negatives as reviewed in the HPI. Physical Exam Updated Vital Signs BP 123/67 (BP Location: Right Arm)   Pulse (!) 106   Temp 98.9 F (37.2 C) (Oral)   Resp (!) 31   Ht _0  (1.88 m)   Wt 113.4 kg   SpO2 96%   BMI 32.10 kg/m   Physical Exam Vitals and nursing note reviewed.  Constitutional:      General: He is not in acute distress.    Appearance: He is well-developed.  HENT:     Head: Normocephalic and atraumatic.  Eyes:     Pupils: Pupils are equal, round, and reactive to light.  Cardiovascular:     Rate and Rhythm: Normal rate and regular rhythm.     Heart sounds: Normal heart sounds. No murmur. No friction rub. No gallop.   Pulmonary:     Effort: Pulmonary effort is normal. No respiratory distress.     Breath sounds: Normal breath sounds. No wheezing.  Abdominal:     General: Bowel sounds are normal. There is no distension.     Palpations: Abdomen is soft.     Tenderness: There is no abdominal tenderness.  Musculoskeletal:     Cervical back: Normal range of motion and neck supple.  Skin:    General: Skin is warm and dry.     Capillary Refill: Capillary refill takes less than 2 seconds.     Findings: No erythema or rash.  Neurological:      Mental Status: He is alert and oriented to person, place, and time.     Motor: No abnormal muscle tone.     Coordination: Coordination normal.  Psychiatric:        Behavior: Behavior normal.     ED Results / Procedures / Treatments   Labs (all labs ordered are listed, but only abnormal results are displayed) Labs Reviewed  COMPREHENSIVE METABOLIC PANEL - Abnormal; Notable for the following components:      Result Value   Potassium 5.5 (*)    CO2 12 (*)  Glucose, Bld 736 (*)    Creatinine, Ser 1.78 (*)    Calcium 8.7 (*)    Total Bilirubin 2.0 (*)    GFR calc non Af Amer 48 (*)    GFR calc Af Amer 56 (*)    Anion gap 24 (*)    All other components within normal limits  CBC WITH DIFFERENTIAL/PLATELET - Abnormal; Notable for the following components:   WBC 13.4 (*)    RBC 4.04 (*)    Hemoglobin 12.1 (*)    HCT 38.7 (*)    Neutro Abs 11.9 (*)    Abs Immature Granulocytes 0.08 (*)    All other components within normal limits  URINALYSIS, ROUTINE W REFLEX MICROSCOPIC - Abnormal; Notable for the following components:   Color, Urine STRAW (*)    Glucose, UA >=500 (*)    Ketones, ur 80 (*)    All other components within normal limits  CBG MONITORING, ED - Abnormal; Notable for the following components:   Glucose-Capillary >600 (*)    All other components within normal limits  POCT I-STAT EG7 - Abnormal; Notable for the following components:   pCO2, Ven 22.9 (*)    pO2, Ven 91.0 (*)    Bicarbonate 11.7 (*)    TCO2 12 (*)    Acid-base deficit 13.0 (*)    Sodium 133 (*)    Potassium 5.4 (*)    Calcium, Ion 1.07 (*)    HCT 38.0 (*)    Hemoglobin 12.9 (*)    All other components within normal limits  BETA-HYDROXYBUTYRIC ACID  BETA-HYDROXYBUTYRIC ACID  I-STAT VENOUS BLOOD GAS, ED    EKG EKG Interpretation  Date/Time:  Tuesday December 29 2019 12:03:58 EST Ventricular Rate:  108 PR Interval:    QRS Duration: 88 QT Interval:  333 QTC Calculation: 447 R Axis:   46 Text  Interpretation: Sinus tachycardia Poor R wave progression Confirmed by Pattricia Boss [phone]) on 12/29/2019 12:07:05 PM   Radiology No results found.  Procedures Procedures (including critical care time)  Medications Ordered in ED Medications  insulin regular, human (MYXREDLIN) 100 units/ 100 mL infusion (has no administration in time range)  0.9 %  sodium chloride infusion (has no administration in time range)  dextrose 5 %-0.45 % sodium chloride infusion (has no administration in time range)  dextrose 50 % solution 0-50 mL (has no administration in time range)  sodium chloride 0.9 % bolus 1,000 mL (has no administration in time range)  ondansetron (ZOFRAN) injection 4 mg (4 mg Intravenous Given 12/29/19 1240)  sodium chloride 0.9 % bolus 1,000 mL (1,000 mLs Intravenous New Bag/Given 12/29/19 1242)  insulin aspart (novoLOG) injection 6 Units (6 Units Intravenous Given 12/29/19 1304)    ED Course  I have reviewed the triage vital signs and the nursing notes.  Pertinent labs & imaging results that were available during my care of the patient were reviewed by me and considered in my medical decision making (see chart for details).    MDM Rules/Calculators/A&P                      Patient will need admission to the hospital for what appears to be dehydration and DKA.  Patient's pH at this point is okay but he does have reduced bicarb and his anion gap is 24 along with a blood sugar 736.  Patient started on the glucose stabilizing protocol.  Patient given IV fluids as well.  Told plan and all questions  were answered. Final Clinical Impression(s) / ED Diagnoses Final diagnoses:  None    Rx / DC Orders ED Discharge Orders    None       Dalia Heading, PA-C 12/29/19 1449    Pattricia Boss, MD 12/30/19 1121

## 2019-12-29 NOTE — ED Triage Notes (Signed)
Pt states he has been out of his insulin for 5 days.  Pt has been feeling SOB, dizzy, and vomiting. Pt is homeless and needs help obtaining his meds. Pt is alert and oriented. CBG 548, BP 118/77, HR 110, 100% on room air

## 2019-12-29 NOTE — ED Notes (Signed)
Pt wanting water. Pt at sink drinking out of fountain. Monitoring equipment off.  PA aware Gave pt cup of water per ok PA

## 2019-12-30 DIAGNOSIS — E1110 Type 2 diabetes mellitus with ketoacidosis without coma: Principal | ICD-10-CM

## 2019-12-30 DIAGNOSIS — F17200 Nicotine dependence, unspecified, uncomplicated: Secondary | ICD-10-CM

## 2019-12-30 DIAGNOSIS — G40909 Epilepsy, unspecified, not intractable, without status epilepticus: Secondary | ICD-10-CM

## 2019-12-30 DIAGNOSIS — F314 Bipolar disorder, current episode depressed, severe, without psychotic features: Secondary | ICD-10-CM

## 2019-12-30 DIAGNOSIS — E1065 Type 1 diabetes mellitus with hyperglycemia: Secondary | ICD-10-CM | POA: Diagnosis present

## 2019-12-30 DIAGNOSIS — Z590 Homelessness: Secondary | ICD-10-CM

## 2019-12-30 DIAGNOSIS — Z9119 Patient's noncompliance with other medical treatment and regimen: Secondary | ICD-10-CM

## 2019-12-30 DIAGNOSIS — E1069 Type 1 diabetes mellitus with other specified complication: Secondary | ICD-10-CM | POA: Diagnosis present

## 2019-12-30 DIAGNOSIS — E10649 Type 1 diabetes mellitus with hypoglycemia without coma: Secondary | ICD-10-CM | POA: Diagnosis present

## 2019-12-30 DIAGNOSIS — F1210 Cannabis abuse, uncomplicated: Secondary | ICD-10-CM

## 2019-12-30 LAB — SARS CORONAVIRUS 2 (TAT 6-24 HRS): SARS Coronavirus 2: NEGATIVE

## 2019-12-30 LAB — BASIC METABOLIC PANEL
Anion gap: 11 (ref 5–15)
Anion gap: 11 (ref 5–15)
Anion gap: 9 (ref 5–15)
BUN: 12 mg/dL (ref 6–20)
BUN: 10 mg/dL (ref 6–20)
BUN: 8 mg/dL (ref 6–20)
CO2: 18 mmol/L — ABNORMAL LOW (ref 22–32)
CO2: 19 mmol/L — ABNORMAL LOW (ref 22–32)
CO2: 18 mmol/L — ABNORMAL LOW (ref 22–32)
Calcium: 8.4 mg/dL — ABNORMAL LOW (ref 8.9–10.3)
Calcium: 8.2 mg/dL — ABNORMAL LOW (ref 8.9–10.3)
Calcium: 7.9 mg/dL — ABNORMAL LOW (ref 8.9–10.3)
Chloride: 105 mmol/L (ref 98–111)
Chloride: 107 mmol/L (ref 98–111)
Chloride: 108 mmol/L (ref 98–111)
Creatinine, Ser: 1.19 mg/dL (ref 0.61–1.24)
Creatinine, Ser: 1.14 mg/dL (ref 0.61–1.24)
Creatinine, Ser: 1.07 mg/dL (ref 0.61–1.24)
GFR calc Af Amer: >60 mL/min (ref >60)
GFR calc Af Amer: >60 mL/min (ref >60)
GFR calc Af Amer: >60 mL/min (ref >60)
GFR calc non Af Amer: >60 mL/min (ref >60)
GFR calc non Af Amer: >60 mL/min (ref >60)
GFR calc non Af Amer: >60 mL/min (ref >60)
Glucose, Bld: 144 mg/dL — ABNORMAL HIGH (ref 70–99)
Glucose, Bld: 172 mg/dL — ABNORMAL HIGH (ref 70–99)
Glucose, Bld: 218 mg/dL — ABNORMAL HIGH (ref 70–99)
Potassium: 3.9 mmol/L (ref 3.5–5.1)
Potassium: 3.7 mmol/L (ref 3.5–5.1)
Potassium: 3.8 mmol/L (ref 3.5–5.1)
Sodium: 134 mmol/L — ABNORMAL LOW (ref 135–145)
Sodium: 137 mmol/L (ref 135–145)
Sodium: 135 mmol/L (ref 135–145)

## 2019-12-30 LAB — GLUCOSE, CAPILLARY
Glucose-Capillary: 220 mg/dL — ABNORMAL HIGH (ref 70–99)
Glucose-Capillary: 250 mg/dL — ABNORMAL HIGH (ref 70–99)
Glucose-Capillary: 291 mg/dL — ABNORMAL HIGH (ref 70–99)
Glucose-Capillary: 302 mg/dL — ABNORMAL HIGH (ref 70–99)

## 2019-12-30 LAB — CBG MONITORING, ED: Glucose-Capillary: 139 mg/dL — ABNORMAL HIGH (ref 70–99)

## 2019-12-30 LAB — BETA-HYDROXYBUTYRIC ACID
Beta-Hydroxybutyric Acid: 3.19 mmol/L — ABNORMAL HIGH (ref 0.05–0.27)
Beta-Hydroxybutyric Acid: 2.72 mmol/L — ABNORMAL HIGH (ref 0.05–0.27)

## 2019-12-30 LAB — T4, FREE: Free T4: 0.52 ng/dL — ABNORMAL LOW (ref 0.61–1.12)

## 2019-12-30 LAB — MRSA PCR SCREENING: MRSA by PCR: NEGATIVE

## 2019-12-30 MED ORDER — POTASSIUM CHLORIDE CRYS ER 20 MEQ PO TBCR
40.0000 meq | EXTENDED_RELEASE_TABLET | Freq: Once | ORAL | Status: AC
  Administered 2019-12-30: 40 meq via ORAL
  Filled 2019-12-30: qty 2

## 2019-12-30 MED ORDER — INSULIN ASPART 100 UNIT/ML ~~LOC~~ SOLN
0.0000 [IU] | Freq: Three times a day (TID) | SUBCUTANEOUS | Status: DC
  Administered 2019-12-30: 5 [IU] via SUBCUTANEOUS
  Administered 2019-12-30 (×2): 3 [IU] via SUBCUTANEOUS
  Administered 2019-12-31: 7 [IU] via SUBCUTANEOUS

## 2019-12-30 MED ORDER — SODIUM CHLORIDE 0.9 % IV BOLUS
1000.0000 mL | Freq: Once | INTRAVENOUS | Status: AC
  Administered 2019-12-30: 1000 mL via INTRAVENOUS

## 2019-12-30 MED ORDER — SODIUM CHLORIDE 0.9 % IV BOLUS
500.0000 mL | Freq: Once | INTRAVENOUS | Status: AC
  Administered 2019-12-30: 500 mL via INTRAVENOUS

## 2019-12-30 MED ORDER — INSULIN GLARGINE 100 UNIT/ML ~~LOC~~ SOLN
15.0000 [IU] | Freq: Every day | SUBCUTANEOUS | Status: DC
  Administered 2019-12-30: 15 [IU] via SUBCUTANEOUS
  Filled 2019-12-30 (×2): qty 0.15

## 2019-12-30 MED ORDER — INSULIN ASPART 100 UNIT/ML ~~LOC~~ SOLN
0.0000 [IU] | Freq: Every day | SUBCUTANEOUS | Status: DC
  Administered 2019-12-30: 4 [IU] via SUBCUTANEOUS

## 2019-12-30 MED ORDER — POTASSIUM CHLORIDE CRYS ER 20 MEQ PO TBCR
30.0000 meq | EXTENDED_RELEASE_TABLET | Freq: Once | ORAL | Status: AC
  Administered 2019-12-30: 30 meq via ORAL
  Filled 2019-12-30: qty 1

## 2019-12-30 NOTE — Plan of Care (Signed)

## 2019-12-30 NOTE — Clinical Social Work Note (Signed)
Primary Care Physician appointment has been made with [facility] for January 18, 2020 at 3:10pm. appointment and information placed on AVS.

## 2019-12-30 NOTE — Clinical Social Work Note (Addendum)
Update 2:42pm Chart reviewed and patient was MATCH on 3/6. MATCH letter is effective until 3/14. Will get meds sent to [facility].  Consult received to assist with medication. CSW asked MD to send prescriptions to [facility] pharmacy to check pricing.   MD will send scripts tomorrow upon discharge. CSW will continue to follow.

## 2019-12-30 NOTE — Progress Notes (Signed)
PROGRESS NOTE    Mark [name]  EXH:[id number] DOB: July 08, 1986 DOA: 12/29/2019 PCP: Patient, No Pcp Per   Brief Narrative:  34 year old with history of seizure, hydrocephalus, DM2, noncompliance admitted for hyperglycemia.  Patient was recently here in the ER with similar complaints and was sent home with insulin prescription and match program with patient did not pick up his insulin.  Upon admission he was hypoglycemic which was treated with Endo tool and eventually transition to subcutaneous Lantus.   Assessment & Plan:   Principal Problem:   DKA (diabetic ketoacidosis) (HCC) Active Problems:   Noncompliance   Bipolar I disorder, most recent episode depressed, severe without psychotic features (HCC)   Homelessness   Marijuana abuse   Tobacco dependence   Seizure disorder (HCC)  Diabetic ketoacidosis Insulin-dependent diabetes mellitus type 2, uncontrolled secondary to poor compliance with his insulin -DKA has resolved, will transition patient from insulin drip to subcutaneous Lantus.  We will advance his diet to diabetic.  Monitor electrolytes.  We can hold off on IV fluids as long as his oral intake is adequate. -Diabetic coordinator consulted to help given further education -Have consulted TOC team to help with his medications. -A1c 8.4  History of seizure disorder -Continue Depakote.  Levels are low due to medication noncompliance.  History of bipolar disorder -Resume home medications  Hypothyroidism, uncontrolled -TSH is elevated, but free T4 is low.  He will need his Synthroid.  Medication noncompliance Homelessness -Due to poor social situation, [name] of social support and poor finances he will remain at risk of medication noncompliance and possibly recurrent admissions to the hospital.  Marijuana use -Encouraged to quit using this and instead uses resources to buy his medications.  DVT prophylaxis: Lovenox Code Status: Full code Family Communication:    Disposition Plan:   Patient From= unknown  Patient Anticipated D/C place= likely shelter  Barriers= maintain hospital stay until safe disposition has been figured out for him.  In the meantime we will advance his diet and see how he does with subcutaneous insulin and blood glucose level.   Subjective: No acute events overnight, this morning had some confusion and he pulled out his IV line.  During my examination he was awake alert, tells me he does not remember how he got to the hospital and has been living in the streets.  He also had [name] of resources to obtain his recently filled insulin.  Review of Systems Otherwise negative except as per HPI, including: General: Denies fever, chills, night sweats or unintended weight loss. Resp: Denies cough, wheezing, shortness of breath. Cardiac: Denies chest pain, palpitations, orthopnea, paroxysmal nocturnal dyspnea. GI: Denies abdominal pain, nausea, vomiting, diarrhea or constipation GU: Denies dysuria, frequency, hesitancy or incontinence MS: Denies muscle aches, joint pain or swelling Neuro: Denies headache, neurologic deficits (focal weakness, numbness, tingling), abnormal gait Psych: Denies anxiety, depression, SI/HI/AVH Skin: Denies new rashes or lesions ID: Denies sick contacts, exotic exposures, travel  Examination:  General exam: Appears calm and comfortable, very poor dentition Respiratory system: Clear to auscultation. Respiratory effort normal. Cardiovascular system: S1 & S2 heard, RRR. No JVD, murmurs, rubs, gallops or clicks. No pedal edema. Gastrointestinal system: Abdomen is nondistended, soft and nontender. No organomegaly or masses felt. Normal bowel sounds heard. Central nervous system: Alert and oriented. No focal neurological deficits. Extremities: Symmetric 5 x 5 power. Skin: No rashes, lesions or ulcers Psychiatry: Judgement and insight appear normal. Mood & affect appropriate.     Objective: Vitals:   12/30/19  0036 12/30/19 0141 12/30/19 0300 12/30/19 1002  BP: (!) 101/53 105/69  113/64  Pulse:  91 83 94  Resp: 18 14  (!) 26  Temp:  98.7 F (37.1 C) 98.8 F (37.1 C) 98.7 F (37.1 C)  TempSrc:  Oral Oral Oral  SpO2:  96%  97%  Weight:      Height:        Intake/Output Summary (Last 24 hours) at 12/30/2019 1101 Last data filed at 12/30/2019 0422 Gross per 24 hour  Intake 2142.89 ml  Output 0 ml  Net 2142.89 ml   Filed Weights   12/29/19 1157  Weight: 113.4 kg     Data Reviewed:   CBC: Recent Labs  Lab 12/29/19 1227 12/29/19 1240  WBC 13.4*  --   NEUTROABS 11.9*  --   HGB 12.1* 12.9*  HCT 38.7* 38.0*  MCV 95.8  --   PLT 389  --    Basic Metabolic Panel: Recent Labs  Lab 12/29/19 1820 12/29/19 2010 12/30/19 0033 12/30/19 0404 12/30/19 0801  NA 136 137 134* 137 135  K 4.2 4.3 3.9 3.7 3.8  CL 105 107 105 107 108  CO2 14* 16* 18* 19* 18*  GLUCOSE 254* 163* 144* 172* 218*  BUN 15 15 12 10 8   CREATININE 1.57* 1.33* 1.19 1.14 1.07  CALCIUM 8.7* 8.7* 8.4* 8.2* 7.9*   GFR: Estimated Creatinine Clearance: 127.8 mL/min (by C-G formula based on SCr of 1.07 mg/dL). Liver Function Tests: Recent Labs  Lab 12/29/19 1227  AST 26  ALT 19  ALKPHOS 92  BILITOT 2.0*  PROT 6.5  ALBUMIN 3.6   No results for input(s): LIPASE, AMYLASE in the last 168 hours. No results for input(s): AMMONIA in the last 168 hours. Coagulation Profile: No results for input(s): INR, PROTIME in the last 168 hours. Cardiac Enzymes: No results for input(s): CKTOTAL, CKMB, CKMBINDEX, TROPONINI in the last 168 hours. BNP (last 3 results) No results for input(s): PROBNP in the last 8760 hours. HbA1C: Recent Labs    12/29/19 1820  HGBA1C 8.4*   CBG: Recent Labs  Lab 12/29/19 2030 12/29/19 2135 12/29/19 2234 12/30/19 0010 12/30/19 0951  GLUCAP 153* 151* 149* 139* 220*   Lipid Profile: No results for input(s): CHOL, HDL, LDLCALC, TRIG, CHOLHDL, LDLDIRECT in the last 72 hours. Thyroid  Function Tests: Recent Labs    12/29/19 1820 12/30/19 0801  TSH 6.460*  --   FREET4  --  0.52*   Anemia Panel: No results for input(s): VITAMINB12, FOLATE, FERRITIN, TIBC, IRON, RETICCTPCT in the last 72 hours. Sepsis Labs: No results for input(s): PROCALCITON, LATICACIDVEN in the last 168 hours.  Recent Results (from the past 240 hour(s))  SARS CORONAVIRUS 2 (TAT 6-24 HRS) Nasopharyngeal Nasopharyngeal Swab     Status: None   Collection Time: 12/29/19  4:21 PM   Specimen: Nasopharyngeal Swab  Result Value Ref Range Status   SARS Coronavirus 2 NEGATIVE NEGATIVE Final    Comment: (NOTE) SARS-CoV-2 target nucleic acids are NOT DETECTED. The SARS-CoV-2 RNA is generally detectable in upper and lower respiratory specimens during the acute phase of infection. Negative results do not preclude SARS-CoV-2 infection, do not rule out co-infections with other pathogens, and should not be used as the sole basis for treatment or other patient management decisions. Negative results must be combined with clinical observations, patient history, and epidemiological information. The expected result is Negative. Fact Sheet for Patients: 02/28/20 Fact Sheet for Healthcare Providers: HairSlick.no This test is not  yet approved or cleared by the [place] and  has been authorized for detection and/or diagnosis of SARS-CoV-2 by FDA under an Emergency Use Authorization (EUA). This EUA will remain  in effect (meaning this test can be used) for the duration of the COVID-19 declaration under Section 56 4(b)(1) of the Act, 21 U.S.C. section 360bbb-3(b)(1), unless the authorization is terminated or revoked sooner. Performed at [facility], [place] [address] [place], [place] [address]   MRSA PCR Screening     Status: None   Collection Time: 12/30/19  1:46 AM   Specimen: Nasopharyngeal  Result Value Ref Range Status   MRSA by  PCR NEGATIVE NEGATIVE Final    Comment:        The GeneXpert MRSA Assay (FDA approved for NASAL specimens only), is one component of a comprehensive MRSA colonization surveillance program. It is not intended to diagnose MRSA infection nor to guide or monitor treatment for MRSA infections. Performed at [facility], [place] [address] [place], [place] [address]          Radiology Studies: No results found.      Scheduled Meds: . divalproex  500 mg Oral Q12H  . enoxaparin (LOVENOX) injection  40 mg Subcutaneous Q24H  . escitalopram  10 mg Oral Daily  . insulin aspart  0-5 Units Subcutaneous QHS  . insulin aspart  0-9 Units Subcutaneous TID WC  . insulin glargine  15 Units Subcutaneous QHS  . levothyroxine  100 mcg Oral Q0600  . nicotine  14 mg Transdermal Daily  . pregabalin  150 mg Oral BID  . QUEtiapine  100 mg Oral QHS   Continuous Infusions: . sodium chloride    . dextrose 5 % and 0.45% NaCl 125 mL/hr at 12/30/19 0338  . insulin 0.9 Units/hr (12/30/19 0028)     LOS: 0 days   Time spent= 35 mins    Tykira Wachs Arsenio Loader, MD [facility]  If 7PM-7AM, please contact night-coverage  12/30/2019, 11:01 AM

## 2019-12-30 NOTE — Progress Notes (Signed)
Patient pulled his IV out.  Nurse unable to regain access.  New order for IV team to place IV.

## 2019-12-31 DIAGNOSIS — E1010 Type 1 diabetes mellitus with ketoacidosis without coma: Secondary | ICD-10-CM

## 2019-12-31 LAB — BASIC METABOLIC PANEL
Anion gap: 8 (ref 5–15)
BUN: 6 mg/dL (ref 6–20)
CO2: 23 mmol/L (ref 22–32)
Calcium: 8.7 mg/dL — ABNORMAL LOW (ref 8.9–10.3)
Chloride: 109 mmol/L (ref 98–111)
Creatinine, Ser: 0.98 mg/dL (ref 0.61–1.24)
GFR calc Af Amer: >60 mL/min (ref >60)
GFR calc non Af Amer: >60 mL/min (ref >60)
Glucose, Bld: 307 mg/dL — ABNORMAL HIGH (ref 70–99)
Potassium: 3.9 mmol/L (ref 3.5–5.1)
Sodium: 140 mmol/L (ref 135–145)

## 2019-12-31 LAB — GLUCOSE, CAPILLARY: Glucose-Capillary: 319 mg/dL — ABNORMAL HIGH (ref 70–99)

## 2019-12-31 LAB — MAGNESIUM: Magnesium: 1.7 mg/dL (ref 1.7–2.4)

## 2019-12-31 MED ORDER — PREGABALIN 150 MG PO CAPS: 150.0000 mg | ORAL_CAPSULE | Freq: Two times a day (BID) | ORAL | 0 refills | Status: DC

## 2019-12-31 MED ORDER — DIVALPROEX SODIUM 500 MG PO DR TAB: 500.0000 mg | DELAYED_RELEASE_TABLET | Freq: Two times a day (BID) | ORAL | 0 refills | Status: DC

## 2019-12-31 MED ORDER — INSULIN ASPART 100 UNIT/ML ~~LOC~~ SOLN: 5.0000 [IU] | Freq: Three times a day (TID) | SUBCUTANEOUS | 0 refills | Status: DC

## 2019-12-31 MED ORDER — INSULIN GLARGINE 100 UNIT/ML ~~LOC~~ SOLN: 25.0000 [IU] | Freq: Every day | SUBCUTANEOUS | 0 refills | Status: DC

## 2019-12-31 MED ORDER — INSULIN ASPART 100 UNIT/ML ~~LOC~~ SOLN: SUBCUTANEOUS | 0 refills | Status: DC

## 2019-12-31 MED ORDER — "PEN NEEDLES 3/16"" 31G X 5 MM MISC": 1.0000 | Freq: Three times a day (TID) | 0 refills | Status: DC

## 2019-12-31 MED ORDER — QUETIAPINE FUMARATE 100 MG PO TABS: 100.0000 mg | ORAL_TABLET | Freq: Every day | ORAL | 0 refills | Status: DC

## 2019-12-31 MED ORDER — INSULIN PEN NEEDLE 30G X 8 MM MISC: 1.0000 | 0 refills | Status: DC | PRN

## 2019-12-31 MED ORDER — INSULIN GLARGINE 100 UNIT/ML ~~LOC~~ SOLN
25.0000 [IU] | Freq: Every day | SUBCUTANEOUS | Status: DC
  Filled 2019-12-31: qty 0.25

## 2019-12-31 MED ORDER — INSULIN ASPART 100 UNIT/ML ~~LOC~~ SOLN: 0.0000 [IU] | Freq: Three times a day (TID) | SUBCUTANEOUS | 0 refills | Status: DC

## 2019-12-31 MED ORDER — ESCITALOPRAM OXALATE 10 MG PO TABS: 10.0000 mg | ORAL_TABLET | Freq: Every day | ORAL | 0 refills | Status: DC

## 2019-12-31 MED ORDER — INSULIN STARTER KIT- PEN NEEDLES (ENGLISH): 1.0000 | Freq: Once | 0 refills | Status: AC

## 2019-12-31 MED ORDER — LEVOTHYROXINE SODIUM 100 MCG PO TABS: 100.0000 ug | ORAL_TABLET | Freq: Every day | ORAL | 0 refills | Status: DC

## 2019-12-31 MED ORDER — INSULIN STARTER KIT- PEN NEEDLES (ENGLISH)
1.0000 | Freq: Once | Status: AC
  Administered 2019-12-31: 1
  Filled 2019-12-31: qty 1

## 2019-12-31 MED ORDER — INSULIN ASPART PROT & ASPART (70-30 MIX) 100 UNIT/ML ~~LOC~~ SUSP: 21.0000 [IU] | Freq: Two times a day (BID) | SUBCUTANEOUS | 0 refills | Status: DC

## 2019-12-31 MED ORDER — BLOOD GLUCOSE MONITOR KIT: PACK | 0 refills | Status: DC

## 2019-12-31 MED FILL — ESCITALOPRAM 10 MG TABLET: 10 | 30 days supply | Qty: 30 | Fill #0

## 2019-12-31 MED FILL — TRUE METRIX GLUCOSE TEST ST: 30 days supply | Qty: 100 | Fill #0

## 2019-12-31 MED FILL — QUETIAPINE FUMARATE 100 MG: 100 | 30 days supply | Qty: 30 | Fill #0

## 2019-12-31 MED FILL — PREGABALIN 150 MG CAPS: 150 | 30 days supply | Qty: 60 | Fill #0

## 2019-12-31 MED FILL — NOVOLOG FLEXPEN SYRINGE: 100 | 24 days supply | Qty: 12 | Fill #0

## 2019-12-31 MED FILL — NOVOLOG MIX 70-30 FLEXPEN S: (70-30) 100 | 26 days supply | Qty: 12 | Fill #0

## 2019-12-31 MED FILL — LEVOTHYROXINE SODIUM 100 MC: 100 | 25 days supply | Qty: 25 | Fill #0

## 2019-12-31 MED FILL — TRUE METRIX BLOOD GLUCOSE M: W/DEVICE | 1 days supply | Qty: 1 | Fill #0

## 2019-12-31 MED FILL — PENTIPS 31G X 8 MM MISC: 31G X 8 MM | 25 days supply | Qty: 100 | Fill #0

## 2019-12-31 MED FILL — TRUEplus LANCETS 28G MISC: 30 days supply | Qty: 100 | Fill #0

## 2019-12-31 MED FILL — DIVALPROEX SOD DR 500 MG TA: 500 | 30 days supply | Qty: 60 | Fill #0

## 2019-12-31 NOTE — TOC Transition Note (Addendum)
Transition of Care [facility]) - CM/SW Discharge Note   Patient Details  Name: Mark [name] MRN: [id number] Date of Birth: [birth year]  Transition of Care [facility] CM/SW Contact:  Truddie Hidden, LCSW Phone Number: 12/31/2019, 12:09 PM   Clinical Narrative:     Discharged home. Information provided on homeless shelters. All medications filled at [facility] pharmacy and delivered to room prior to discharge. Prior MATCH was used as it is active until 3/14. Bus pass provided for transportation. PCP appointment made at [facility] for 3/29 at 3:10pm. Information added to AVS. No other needs at this time. Case closed to this CSW.   Final next level of care: Homeless Shelter Barriers to Discharge: Barriers Resolved   Patient Goals and CMS Choice        Discharge Placement                       Discharge Plan and Services                                     Social Determinants of Health (SDOH) Interventions     Readmission Risk Interventions No flowsheet data found.

## 2019-12-31 NOTE — Discharge Summary (Signed)
Physician Discharge Summary  Mark [name] XKG:[id number] DOB: 1986/06/19 DOA: 12/29/2019  PCP: Patient, No Pcp Per  Admit date: 12/29/2019 Discharge date: 12/31/2019  Admitted From: Home Disposition: Home  Recommendations for Outpatient Follow-up:  1. [facility]. 2. 70/30 NovoLog pen given-21 units twice daily, also sliding scale instructions given  Discharge Condition: Stable CODE STATUS: Full code Diet recommendation: Diabetic  Brief/Interim Summary: 34 year old with history of seizure, hydrocephalus, DM2, noncompliance admitted for hyperglycemia.  Patient was recently here in the ER with similar complaints and was sent home with insulin prescription and match program with patient did not pick up his insulin.  Upon admission he was hypoglycemic which was treated with Endo tool and eventually transition to subcutaneous Lantus.  Patient did well in the hospital, upon discharge he was transitioned to his home regimen of NovoLog 70/30 21 units twice daily, sliding scale.  With the help of TOC had in-house pharmacy, medications were provided to him.  For ease of giving him medication, he was given insulin pen.  Diabetic ketoacidosis Insulin-dependent diabetes mellitus type 2, uncontrolled secondary to poor compliance with his insulin -DKA is resolved, he will be transitioned from Lantus to NovoLog 70/30 21 units twice daily with sliding scale which she was on previously but not compliant with it.  Instead of while, he has been given pen so we can easily administer it.  [facility] has been made. -A1c 8.4  History of seizure disorder -Continue Depakote.    Medication refilled  History of bipolar disorder -Resume home medications  Hypothyroidism, uncontrolled -TSH is elevated, but free T4 is low.  He will need his Synthroid.  Medication noncompliance Homelessness -Due to poor social situation, lack  of social support and poor finances he will remain at risk of medication noncompliance and possibly recurrent admissions to the hospital.  Marijuana use -Encouraged to quit using this and instead uses resources to buy his medications.  I have refilled all of his medications in the hospital and he has been given those medications at bedside prior to his discharge.  He also has a follow-up appointment.  If he remains noncompliant, he is at high risk for readmission.  Unfortunately he has a poor social economic status   Discharge Diagnoses:  Principal Problem:   DKA (diabetic ketoacidosis) ([place]) Active Problems:   Noncompliance   Bipolar I disorder, most recent episode depressed, severe without psychotic features ([place])   Homelessness   Marijuana abuse   Tobacco dependence   Seizure disorder ([place])   DKA, type 1 ([place])    Subjective: No complaints, feels okay  Discharge Exam: Vitals:   12/31/19 0400 12/31/19 0829  BP: 108/74 107/70  Pulse: 72 65  Resp:  20  Temp: 98.2 F (36.8 C) 97.7 F (36.5 C)  SpO2: 100% 100%   Vitals:   12/30/19 2157 12/31/19 0053 12/31/19 0400 12/31/19 0829  BP: 120/80 112/79 108/74 107/70  Pulse: 76 69 72 65  Resp:    20  Temp: 98.2 F (36.8 C) 98.7 F (37.1 C) 98.2 F (36.8 C) 97.7 F (36.5 C)  TempSrc: Oral Oral Oral   SpO2: 97% 98% 100% 100%  Weight:      Height:        General: Pt is alert, awake, not in acute distress, poor hygiene, disheveled Cardiovascular: RRR, S1/S2 +, no rubs, no gallops Respiratory: CTA bilaterally, no wheezing, no rhonchi Abdominal: Soft, NT, ND, bowel sounds + Extremities: no  edema, no cyanosis  Discharge Instructions   Allergies as of 12/31/2019      Reactions   Mushroom Extract Complex Hives, Itching, Nausea And Vomiting   Penicillins Anaphylaxis, Hives, Swelling   Has patient had a PCN reaction causing immediate rash, facial/tongue/throat swelling, SOB or lightheadedness with hypotension: Yes Has  patient had a PCN reaction causing severe rash involving mucus membranes or skin necrosis: No Has patient had a PCN reaction that required hospitalization Yes Has patient had a PCN reaction occurring within the last 10 years: Yes If all of the above answers are "NO", then may proceed with Cephalosporin use.   Sulfa Antibiotics Anaphylaxis, Hives   Clindamycin/lincomycin Hives      Medication List    STOP taking these medications   acetaminophen 325 MG tablet Commonly known as: TYLENOL   insulin glargine 100 UNIT/ML injection Commonly known as: LANTUS     TAKE these medications   blood glucose meter kit and supplies Kit Dispense based on patient and insurance preference. Use up to four times daily as directed. (FOR ICD-9 250.00, 250.01).   divalproex 500 MG DR tablet Commonly known as: DEPAKOTE Take 1 tablet (500 mg total) by mouth every 12 (twelve) hours.   escitalopram 10 MG tablet Commonly known as: LEXAPRO Take 1 tablet (10 mg total) by mouth daily.   insulin aspart 100 UNIT/ML injection Commonly known as: novoLOG Blood Sugar: 60-110= none,111-150= 2 unit, 151-200= 4 unit, 201-250= 6 unit, 251-300= 8 unit, 301-350= 10 unit, >350=   12 units Call MD What changed:   how much to take  how to take this  when to take this  additional instructions   insulin aspart protamine- aspart (70-30) 100 UNIT/ML injection Commonly known as: NOVOLOG MIX 70/30 Inject 0.21 mLs (21 Units total) into the skin 2 (two) times daily before a meal. What changed: how much to take   insulin starter kit- pen needles Misc 1 kit by Other route once for 1 dose.   levothyroxine 100 MCG tablet Commonly known as: SYNTHROID Take 1 tablet (100 mcg total) by mouth daily before breakfast.   Pen Needles 3/16" 31G X 5 MM Misc 1 Dose by Does not apply route 3 (three) times daily. What changed:   medication strength  how much to take  how to take this  when to take this  reasons to take  this   pregabalin 150 MG capsule Commonly known as: LYRICA Take 1 capsule (150 mg total) by mouth 2 (two) times daily.   QUEtiapine 100 MG tablet Commonly known as: SEROQUEL Take 1 tablet (100 mg total) by mouth at bedtime.      Follow-up [facility] Follow up on 01/18/2020.   Why: Appointment is January 18, 2020 at 3:10pm. Please arrive 15 mins prior to your appointment.  Contact information: [place] [address] [phone]         Allergies  Allergen Reactions  . Mushroom Extract Complex Hives, Itching and Nausea And Vomiting  . Penicillins Anaphylaxis, Hives and Swelling    Has patient had a PCN reaction causing immediate rash, facial/tongue/throat swelling, SOB or lightheadedness with hypotension: Yes Has patient had a PCN reaction causing severe rash involving mucus membranes or skin necrosis: No Has patient had a PCN reaction that required hospitalization Yes Has patient had a PCN reaction occurring within the last 10 years: Yes If all of the above answers are "NO", then  may proceed with Cephalosporin use.  . Sulfa Antibiotics Anaphylaxis and Hives  . Clindamycin/Lincomycin Hives    You were cared for by a hospitalist during your hospital stay. If you have any questions about your discharge medications or the care you received while you were in the hospital after you are discharged, you can call the unit and asked to speak with the hospitalist on call if the hospitalist that took care of you is not available. Once you are discharged, your primary care physician will handle any further medical issues. Please note that no refills for any discharge medications will be authorized once you are discharged, as it is imperative that you return to your primary care physician (or establish a relationship with a primary care physician if you do not have one) for your aftercare needs so that they can  reassess your need for medications and monitor your lab values.   Procedures/Studies:  No results found.   The results of significant diagnostics from this hospitalization (including imaging, microbiology, ancillary and laboratory) are listed below for reference.     Microbiology: Recent Results (from the past 240 hour(s))  SARS CORONAVIRUS 2 (TAT 6-24 HRS) Nasopharyngeal Nasopharyngeal Swab     Status: None   Collection Time: 12/29/19  4:21 PM   Specimen: Nasopharyngeal Swab  Result Value Ref Range Status   SARS Coronavirus 2 NEGATIVE NEGATIVE Final    Comment: (NOTE) SARS-CoV-2 target nucleic acids are NOT DETECTED. The SARS-CoV-2 RNA is generally detectable in upper and lower respiratory specimens during the acute phase of infection. Negative results do not preclude SARS-CoV-2 infection, do not rule out co-infections with other pathogens, and should not be used as the sole basis for treatment or other patient management decisions. Negative results must be combined with clinical observations, patient history, and epidemiological information. The expected result is Negative. Fact Sheet for Patients: SugarRoll.be Fact Sheet for Healthcare Providers: https://www.woods-mathews.com/ This test is not yet approved or cleared by the [place] FDA and  has been authorized for detection and/or diagnosis of SARS-CoV-2 by FDA under an Emergency Use Authorization (EUA). This EUA will remain  in effect (meaning this test can be used) for the duration of the COVID-19 declaration under Section 56 4(b)(1) of the Act, 21 U.S.C. section 360bbb-3(b)(1), unless the authorization is terminated or revoked sooner. Performed at [facility], [place] [address], [place], [place] [address]   MRSA PCR Screening     Status: None   Collection Time: 12/30/19  1:46 AM   Specimen: Nasopharyngeal  Result Value Ref Range Status   MRSA by PCR NEGATIVE  NEGATIVE Final    Comment:        The GeneXpert MRSA Assay (FDA approved for NASAL specimens only), is one component of a comprehensive MRSA colonization surveillance program. It is not intended to diagnose MRSA infection nor to guide or monitor treatment for MRSA infections. Performed at [facility], [place] [address], [place], [place] [address]      Labs: BNP (last 3 results) No results for input(s): BNP in the last 8760 hours. Basic Metabolic Panel: Recent Labs  Lab 12/29/19 2010 12/30/19 0033 12/30/19 0404 12/30/19 0801 12/31/19 0307  NA 137 134* 137 135 140  K 4.3 3.9 3.7 3.8 3.9  CL 107 105 107 108 109  CO2 16* 18* 19* 18* 23  GLUCOSE 163* 144* 172* 218* 307*  BUN _0 CREATININE 1.33* 1.19 1.14 1.07 0.98  CALCIUM 8.7*  8.4* 8.2* 7.9* 8.7*  MG  --   --   --   --  1.7   Liver Function Tests: Recent Labs  Lab 12/29/19 1227  AST 26  ALT 19  ALKPHOS 92  BILITOT 2.0*  PROT 6.5  ALBUMIN 3.6   No results for input(s): LIPASE, AMYLASE in the last 168 hours. No results for input(s): AMMONIA in the last 168 hours. CBC: Recent Labs  Lab 12/29/19 1227 12/29/19 1240  WBC 13.4*  --   NEUTROABS 11.9*  --   HGB 12.1* 12.9*  HCT 38.7* 38.0*  MCV 95.8  --   PLT 389  --    Cardiac Enzymes: No results for input(s): CKTOTAL, CKMB, CKMBINDEX, TROPONINI in the last 168 hours. BNP: Invalid input(s): POCBNP CBG: Recent Labs  Lab 12/30/19 0951 12/30/19 1148 12/30/19 1605 12/30/19 2156 12/31/19 0749  GLUCAP 220* 250* 291* 302* 319*   D-Dimer No results for input(s): DDIMER in the last 72 hours. Hgb A1c Recent Labs    12/29/19 1820  HGBA1C 8.4*   Lipid Profile No results for input(s): CHOL, HDL, LDLCALC, TRIG, CHOLHDL, LDLDIRECT in the last 72 hours. Thyroid function studies Recent Labs    12/29/19 1820  TSH 6.460*   Anemia work up No results for input(s): VITAMINB12, FOLATE, FERRITIN, TIBC, IRON, RETICCTPCT in the last 72  hours. Urinalysis    Component Value Date/Time   COLORURINE STRAW (A) 12/29/2019 [place] 12/29/2019 1227   APPEARANCEUR Clear 01/20/2015 2136   LABSPEC 1.025 12/29/2019 1227   LABSPEC 1.023 01/20/2015 2136   PHURINE 5.0 12/29/2019 1227   GLUCOSEU >=500 (A) 12/29/2019 1227   GLUCOSEU >=500 01/20/2015 2136   HGBUR NEGATIVE 12/29/2019 1227   BILIRUBINUR NEGATIVE 12/29/2019 1227   BILIRUBINUR Negative 01/20/2015 2136   KETONESUR 80 (A) 12/29/2019 1227   PROTEINUR NEGATIVE 12/29/2019 1227   NITRITE NEGATIVE 12/29/2019 1227   LEUKOCYTESUR NEGATIVE 12/29/2019 1227   LEUKOCYTESUR Negative 01/20/2015 2136   Sepsis Labs Invalid input(s): PROCALCITONIN,  WBC,  LACTICIDVEN Microbiology Recent Results (from the past 240 hour(s))  SARS CORONAVIRUS 2 (TAT 6-24 HRS) Nasopharyngeal Nasopharyngeal Swab     Status: None   Collection Time: 12/29/19  4:21 PM   Specimen: Nasopharyngeal Swab  Result Value Ref Range Status   SARS Coronavirus 2 NEGATIVE NEGATIVE Final    Comment: (NOTE) SARS-CoV-2 target nucleic acids are NOT DETECTED. The SARS-CoV-2 RNA is generally detectable in upper and lower respiratory specimens during the acute phase of infection. Negative results do not preclude SARS-CoV-2 infection, do not rule out co-infections with other pathogens, and should not be used as the sole basis for treatment or other patient management decisions. Negative results must be combined with clinical observations, patient history, and epidemiological information. The expected result is Negative. Fact Sheet for Patients: SugarRoll.be Fact Sheet for Healthcare Providers: https://www.woods-mathews.com/ This test is not yet approved or cleared by the [place] FDA and  has been authorized for detection and/or diagnosis of SARS-CoV-2 by FDA under an Emergency Use Authorization (EUA). This EUA will remain  in effect (meaning this test can be  used) for the duration of the COVID-19 declaration under Section 56 4(b)(1) of the Act, 21 U.S.C. section 360bbb-3(b)(1), unless the authorization is terminated or revoked sooner. Performed at [facility], [place] [address] [place], [place] [address]   MRSA PCR Screening     Status: None   Collection Time: 12/30/19  1:46 AM   Specimen: Nasopharyngeal  Result Value Ref Range Status   MRSA by PCR NEGATIVE NEGATIVE Final    Comment:        The GeneXpert MRSA Assay (FDA approved for NASAL specimens only), is one component of a comprehensive MRSA colonization surveillance program. It is not intended to diagnose MRSA infection nor to guide or monitor treatment for MRSA infections. Performed at [facility], [place] [address] [place], [place] [address]      Time coordinating discharge:  I have spent 35 minutes face to face with the patient and on the ward discussing the patients care, assessment, plan and disposition with other care givers. >50% of the time was devoted counseling the patient about the risks and benefits of treatment/Discharge disposition and coordinating care.   SIGNED:   Damita Lack, MD  [facility] 12/31/2019, 11:04 AM   If 7PM-7AM, please contact night-coverage

## 2019-12-31 NOTE — Plan of Care (Signed)
Care plan goals met. Pt adequate for discharge.  

## 2019-12-31 NOTE — Progress Notes (Signed)
Patient was stable at discharge. I removed his IV. We reviewed the discharge education. Patient verbalized understanding and had no further questions. Insulin regimen was reviewed. How to use his diabetic supplies was reviewed. When to take the next dose of every medication that transition of care pharmacy gave him was reviewed. RN gave pt a bus pass for transportation per Child psychotherapist.  Patient left with prescriptions and belongings in hand.

## 2019-12-31 NOTE — Discharge Instructions (Signed)
Correction Insulin  Correction insulin, also called corrective insulin or a supplemental dose, is a small amount of insulin that can be used to lower your blood sugar (glucose) if it is too high. You may be instructed to check your blood glucose at certain times of the day and to use correction insulin as needed to lower your blood glucose to your target range. Correction insulin is primarily used as part of diabetes management. It may also be prescribed for people who do not have diabetes. What is a correction scale? A correction scale, also called a sliding scale, is prescribed by your health care provider to help you determine when you need correction insulin. Your correction scale is based on your individual treatment goals, and it has two parts:  Ranges of blood glucose levels.  How much correction insulin to give yourself if your blood sugar falls within a certain range. If your blood glucose is in your desired range, you will not need correction insulin and you should take your normal insulin dose. What type of insulin do I need? Your health care provider may prescribe rapid-acting or short-acting insulin for you to use as correction insulin. Rapid-acting insulin:  Starts working quickly, in as little as 5 minutes.  Can last for 3-6 hours.  Works well when taken right before a meal to quickly lower blood glucose. Short-acting insulin:  Starts working in about 30 minutes.  Can last for 6-8 hours.  Should be taken about 30 minutes before you start eating a meal. Talk with your health care provider or pharmacist about which type of correction insulin to take and when to take it. If you use insulin to control your diabetes, you should use correction insulin in addition to the longer-acting (basal) insulin that you normally use. How do I manage my blood glucose with correction insulin? Giving a correction dose  Check your blood glucose as directed by your health care provider.  Use  your correction scale to find the range that your blood glucose is in.  Identify the units of insulin that match your blood glucose range.  Make sure you have food available that you can eat in the next 15-30 minutes, after your correction dose.  Give yourself the dose of correction insulin that your health care provider has prescribed in your correction scale. Always make sure you are using the right type of insulin. ? If your correction insulin is rapid-acting, start eating a meal within 15 minutes after your correction dose to keep your blood glucose from getting too low. ? If your correction insulin is short-acting, start eating a meal within 30 minutes after your correction dose to keep your blood glucose from getting too low. Keeping a blood glucose log   Write down your blood glucose test results and the amount of insulin that you give yourself. Do this every time you check blood glucose or take insulin. Bring this log with you to your medical visits. This information will help your health care provider to manage your medicines.  Note anything that may affect your blood glucose, such as: ? Changes in normal exercise or activity. ? Changes in your normal schedule, such as changes in your sleep routine, going on vacation, changing your diet, or holidays. ? New over-the-counter or prescription medicines. ? Illness, stress, or anxiety. ? Changes in the time that you took your medicine or insulin. ? Changes in your meals, such as skipping a meal, having a late meal, or dining out. ? Eating  things that may affect blood glucose, such as snacks, meal portions that are larger than normal, drinks that contain sugar, or eating less than usual. What do I need to know about hyperglycemia and hypoglycemia? What is hyperglycemia? Hyperglycemia, also called high blood glucose, occurs when blood glucose is too high. Make sure you know the early signs of hyperglycemia, such as:  Increased  thirst.  Hunger.  Feeling very tired.  Needing to urinate more often than usual.  Blurry vision. What is hypoglycemia? Hypoglycemia is also called low blood glucose. Be aware of "stacking" your insulin doses. This happens when you correct a high blood glucose by giving yourself extra insulin too soon after a previous correction dose or mealtime dose. This may cause you to have too much insulin in your body and may put you at risk for hypoglycemia. Hypoglycemia occurs with a blood glucose level at or below 70 mg/dL (3.9 mmol/L). It is important to know the symptoms of hypoglycemia and treat it right away. Always have a 15-gram rapid-acting carbohydrate snack with you to treat low blood glucose. Family members and close friends should also know the symptoms and should understand how to treat hypoglycemia, in case you are not able to treat yourself. What are the symptoms of hypoglycemia? Hypoglycemia symptoms can include:  Hunger.  Anxiety.  Sweating and feeling clammy.  Confusion.  Dizziness or light-headedness.  Sleepiness.  Nausea.  Increased heart rate.  Headache.  Blurry vision.  Jerky movements that you cannot control (seizure).  Nightmares.  Tingling or numbness around the mouth, lips, or tongue.  A change in speech.  Decreased ability to concentrate.  A change in coordination.  Restless sleep.  Tremors or shakes.  Fainting.  Irritability. How do I treat hypoglycemia? If you are alert and able to swallow safely, follow the 15:15 rule:  Take 15 grams of a rapid-acting carbohydrate. Rapid-acting options include: ? 1 tube of glucose gel. ? 3 glucose pills. ? 6-8 pieces of hard candy. ? 4 oz (120 mL) of fruit juice. ? 4 oz (120 mL) of regular (not diet) soda.  Check your blood glucose 15 minutes after you take the carbohydrate. ? If the repeat blood glucose level is still at or below 70 mg/dL (3.9 mmol/L), take 15 grams of a carbohydrate again. ? If  your blood glucose level does not increase above 70 mg/dL (3.9 mmol/L) after 3 tries, seek emergency medical care.  After your blood glucose level returns to normal, eat a meal or a snack within 1 hour.  How do I treat severe hypoglycemia? Severe hypoglycemia is when your blood glucose level is at or below 54 mg/dL (3 mmol/L). Severe hypoglycemia is an emergency. Do not wait to see if the symptoms will go away. Get medical help right away. Call your local emergency services (911 in the [place] Do not drive yourself to the hospital. If you have severe hypoglycemia and you cannot eat or drink, you may need an injection of glucagon. A family member or close friend should learn how to check your blood glucose and how to give you a glucagon injection. Ask your health care provider if you need to have an emergency glucagon injection kit available. Severe hypoglycemia may need to be treated in a hospital. The treatment may include getting glucose through an IV tube. You may also need treatment for the cause of your hypoglycemia. Why do I need correction insulin if I do not have diabetes? If you do not have diabetes, your  health care provider may prescribe insulin because:  Keeping your blood glucose in the target range is important for your overall health.  You are taking medicines that cause your blood glucose to be higher than normal. Contact a health care provider if:  You develop low blood glucose that you are not able to treat yourself.  You have high blood glucose that you are not able to correct with correction insulin.  Your blood glucose is often too low.  You used emergency glucagon to treat low blood glucose. Get help right away if:  You become unresponsive. If this happens, someone else should call emergency services (911 in the [place] right away.  Your blood glucose is lower than 54 mg/dL (3.0 mmol/L).  You become confused or you have trouble thinking clearly.  You have difficulty  breathing. Summary  Correction insulin is a small amount of insulin that can be used to lower your blood sugar (glucose) if it is too high.  Talk with your health care provider or pharmacist about which type of correction insulin to take and when to take it. If you use insulin to control your diabetes, you should use correction insulin in addition to the longer-acting (basal) insulin that you normally use.  You may be instructed to check your blood glucose at certain times of the day and to use correction insulin as needed to lower your blood glucose to your target range. Always keep a log of your blood glucose values and the amount of insulin that you used.  It is important to know the symptoms of hypoglycemia and treat it right away. Always have a 15-gram rapid-acting carbohydrate snack with you to treat low blood glucose. Family members and close friends should also know the symptoms and should understand how to treat hypoglycemia, in case you are not able to treat yourself. This information is not intended to replace advice given to you by your health care provider. Make sure you discuss any questions you have with your health care provider. Document Revised: 10/18/2016 Document Reviewed: 07/06/2016 Elsevier Patient Education  [place].    Type 1 Diabetes Mellitus, Self Care, Adult When you have type 1 diabetes (type 1 diabetes mellitus), you must make sure your blood sugar (glucose) stays in a healthy range. You can do this with:  Insulin.  Nutrition.  Exercise.  Lifestyle changes.  Other medicines, if needed.  Support from your doctors and others. How to stay aware of blood sugar   Check your blood sugar every day, as often as told.  Have your A1c (hemoglobin A1c) level checked two or more times a year. Have it checked more often if your doctor tells you to. Your doctor will set personal treatment goals for you. Generally, you should have these blood sugar  levels:  Before meals (preprandial): 80-130 mg/dL (4.4-7.2 mmol/L).  After meals (postprandial): below 180 mg/dL (10 mmol/L).  A1c level: less than 7%. How to manage high and low blood sugar Signs of high blood sugar High blood sugar is called hyperglycemia. Know the signs of high blood sugar. Signs may include:  Feeling: ? Thirsty. ? Hungry. ? Very tired.  Needing to pee (urinate) more than usual.  Blurry vision. Signs of low blood sugar Low blood sugar is called hypoglycemia. This is when blood sugar is at or below 70 mg/dL (3.9 mmol/L). Signs may include:  Feeling: ? Hungry. ? Worried or nervous (anxious). ? Sweaty and clammy. ? Confused. ? Dizzy. ? Sleepy. ? Sick  to your stomach (nauseous).  Having: ? A fast heartbeat. ? A headache. ? A change in your vision. ? Tingling or no feeling (numbness) around your mouth, lips, or tongue. ? Jerky movements that you cannot control (seizure).  Having trouble with: ? Moving (coordination). ? Sleeping. ? Passing out (fainting). ? Getting upset easily (irritability). Treating low blood sugar To treat low blood sugar, eat or drink something sugary right away. If you can think clearly and swallow safely, follow the 15:15 rule:  Take 15 grams of a fast-acting carb (carbohydrate). Talk with your doctor about how much you should take.  Some fast-acting carbs are: ? Sugar tablets (glucose pills). Take 3-4 pills. ? 6-8 pieces of hard candy. ? 4-6 oz (120-50 mL) of fruit juice. ? 4-6 oz (120-150 mL) of regular (not diet) soda. ? Honey or sugar (1 Tbsp).  Check your blood sugar 15 minutes after you take the carb.  If your blood sugar is still at or below 70 mg/dL (3.9 mmol/L), take 15 grams of a carb again.  If your blood sugar does not go above 70 mg/dL (3.9 mmol/L) after 3 tries, get help right away.  After your blood sugar goes back to normal, eat a meal or a snack within 1 hour. Treating very low blood sugar If your  blood sugar is at or below 54 mg/dL (3 mmol/L), you have very low blood sugar (severe hypoglycemia). This is an emergency. Do not wait to see if the symptoms will go away. Get medical help right away. Call your local emergency services (911 in the [place] If you have very low blood sugar and you cannot eat or drink, you may need a glucagon shot (injection). A family member or friend should learn how to check your blood sugar and how to give you a glucagon shot. Ask your doctor if you need to have a glucagon shot kit at home. Follow these instructions at home: Medicine  Take insulin and diabetes medicines as told.  If your doctor says you should take more or less insulin and medicines, do this exactly as told.  Do not run out of insulin or medicines. Having diabetes can put you at risk for other long-term conditions. These include heart disease and kidney disease. Your doctor may prescribe medicines to help you not have these problems. Food   Make healthy food choices. These include: ? Chicken, fish, egg whites, and beans. ? Oats, whole wheat, bulgur, brown rice, quinoa, and millet. ? Fresh fruits and vegetables. ? Low-fat dairy products. ? Nuts, avocado, olive oil, and canola oil.  Meet with a food specialist (registered dietitian). He or she can help you make an eating plan that is right for you.  Follow instructions from your doctor about what you cannot eat or drink.  Drink enough fluid to keep your pee (urine) pale yellow.  Keep track of carbs that you eat. Do this by reading food labels and learning food serving sizes.  Follow your sick day plan when you cannot eat or drink normally. Make this plan with your doctor so it is ready to use. Activity  Exercise 3 or more times a week.  Do not go more than 2 days without exercising.  Talk with your doctor before you start a new exercise. Your doctor may need to tell you to change: ? How much insulin or medicines you take. ? How  much food you eat. Lifestyle  Do not use any tobacco products. These include cigarettes, chewing  tobacco, and e-cigarettes. If you need help quitting, ask your doctor.  Ask your doctor how much alcohol is safe for you.  Learn to deal with stress. If you need help with this, ask your doctor. Body care   Stay up to date with your shots (immunizations).  Have your eyes and feet checked by a doctor as often as told.  Check your skin and feet every day. Check for cuts, bruises, redness, blisters, or sores.  Brush your teeth and gums two times a day. Floss one or more times a day.  Go to the dentist one or more times every 6 months.  Stay at a healthy weight. General instructions  Take over-the-counter and prescription medicines only as told by your doctor.  Share your diabetes care plan with: ? Your work or school. ? People you live with.  Check your pee (urine) for ketones: ? When you are sick. ? As told by your doctor.  Carry a card or wear jewelry that says you have diabetes.  Keep all follow-up visits as told by your doctor. This is important. Questions to ask your doctor  Do I need to meet with a diabetes educator?  Where can I find a support group for people with diabetes? Where to find more information To learn more about diabetes, visit:  American Diabetes Association: www.diabetes.org  American Association of Diabetes Educators: www.diabeteseducator.org Summary  When you have type 1 diabetes, you must make sure your blood sugar (glucose) stays in a healthy range.  Check your blood sugar every day, as often as told.  Take insulin and diabetes medicines as told.  Keep all follow-up visits as told by your doctor. This is important. This information is not intended to replace advice given to you by your health care provider. Make sure you discuss any questions you have with your health care provider. Document Revised: 05/01/2018 Document Reviewed:  11/11/2015 Elsevier Patient Education  [place].  Hypoglycemia Hypoglycemia is when the sugar (glucose) level in your blood is too low. Signs of low blood sugar may include:  Feeling: ? Hungry. ? Worried or nervous (anxious). ? Sweaty and clammy. ? Confused. ? Dizzy. ? Sleepy. ? Sick to your stomach (nauseous).  Having: ? A fast heartbeat. ? A headache. ? A change in your vision. ? Tingling or no feeling (numbness) around your mouth, lips, or tongue. ? Jerky movements that you cannot control (seizure).  Having trouble with: ? Moving (coordination). ? Sleeping. ? Passing out (fainting). ? Getting upset easily (irritability). Low blood sugar can happen to people who have diabetes and people who do not have diabetes. Low blood sugar can happen quickly, and it can be an emergency. Treating low blood sugar Low blood sugar is often treated by eating or drinking something sugary right away, such as:  Fruit juice, 4-6 oz (120-150 mL).  Regular soda (not diet soda), 4-6 oz (120-150 mL).  Low-fat milk, 4 oz (120 mL).  Several pieces of hard candy.  Sugar or honey, 1 Tbsp (15 mL). Treating low blood sugar if you have diabetes If you can think clearly and swallow safely, follow the 15:15 rule:  Take 15 grams of a fast-acting carb (carbohydrate). Talk with your doctor about how much you should take.  Always keep a source of fast-acting carb with you, such as: ? Sugar tablets (glucose pills). Take 3-4 pills. ? 6-8 pieces of hard candy. ? 4-6 oz (120-150 mL) of fruit juice. ? 4-6 oz (120-150 mL) of  regular (not diet) soda. ? 1 Tbsp (15 mL) honey or sugar.  Check your blood sugar 15 minutes after you take the carb.  If your blood sugar is still at or below 70 mg/dL (3.9 mmol/L), take 15 grams of a carb again.  If your blood sugar does not go above 70 mg/dL (3.9 mmol/L) after 3 tries, get help right away.  After your blood sugar goes back to normal, eat a meal or a  snack within 1 hour.  Treating very low blood sugar If your blood sugar is at or below 54 mg/dL (3 mmol/L), you have very low blood sugar (severe hypoglycemia). This may also cause:  Passing out.  Jerky movements you cannot control (seizure).  Losing consciousness (coma). This is an emergency. Do not wait to see if the symptoms will go away. Get medical help right away. Call your local emergency services (911 in the [place] Do not drive yourself to the hospital. If you have very low blood sugar and you cannot eat or drink, you may need a glucagon shot (injection). A family member or friend should learn how to check your blood sugar and how to give you a glucagon shot. Ask your doctor if you need to have a glucagon shot kit at home. Follow these instructions at home: General instructions  Take over-the-counter and prescription medicines only as told by your doctor.  Stay aware of your blood sugar as told by your doctor.  Limit alcohol intake to no more than 1 drink a day for nonpregnant women and 2 drinks a day for men. One drink equals 12 oz of beer (355 mL), 5 oz of wine (148 mL), or 1 oz of hard liquor (44 mL).  Keep all follow-up visits as told by your doctor. This is important. If you have diabetes:   Follow your diabetes care plan as told by your doctor. Make sure you: ? Know the signs of low blood sugar. ? Take your medicines as told. ? Follow your exercise and meal plan. ? Eat on time. Do not skip meals. ? Check your blood sugar as often as told by your doctor. Always check it before and after exercise. ? Follow your sick day plan when you cannot eat or drink normally. Make this plan ahead of time with your doctor.  Share your diabetes care plan with: ? Your work or school. ? People you live with.  Check your pee (urine) for ketones: ? When you are sick. ? As told by your doctor.  Carry a card or wear jewelry that says you have diabetes. Contact a doctor if:  You  have trouble keeping your blood sugar in your target range.  You have low blood sugar often. Get help right away if:  You still have symptoms after you eat or drink something sugary.  Your blood sugar is at or below 54 mg/dL (3 mmol/L).  You have jerky movements that you cannot control.  You pass out. These symptoms may be an emergency. Do not wait to see if the symptoms will go away. Get medical help right away. Call your local emergency services (911 in the [place] Do not drive yourself to the hospital. Summary  Hypoglycemia happens when the level of sugar (glucose) in your blood is too low.  Low blood sugar can happen to people who have diabetes and people who do not have diabetes. Low blood sugar can happen quickly, and it can be an emergency.  Make sure you know the  signs of low blood sugar and know how to treat it.  Always keep a source of sugar (fast-acting carb) with you to treat low blood sugar. This information is not intended to replace advice given to you by your health care provider. Make sure you discuss any questions you have with your health care provider. Document Revised: 01/29/2019 Document Reviewed: 11/11/2015 Elsevier Patient Education  2020 [employer].

## 2019-12-31 NOTE — Progress Notes (Addendum)
Inpatient Diabetes Program Recommendations  AACE/ADA: New Consensus Statement on Inpatient Glycemic Control (2015)  Target Ranges:  Prepandial:   less than 140 mg/dL      Peak postprandial:   less than 180 mg/dL (1-2 hours)      Critically ill patients:  140 - 180 mg/dL   Lab Results  Component Value Date   GLUCAP 319 (H) 12/31/2019   HGBA1C 8.4 (H) 12/29/2019    Review of Glycemic Control Results for Mark [name], Mark [name] (MRN [id number]) as of 12/31/2019 08:16  Ref. Range 12/30/2019 09:51 12/30/2019 11:48 12/30/2019 16:05 12/30/2019 21:56 12/31/2019 07:49  Glucose-Capillary Latest Ref Range: 70 - 99 mg/dL 220 (H) 250 (H) 291 (H) 302 (H) 319 (H)  Results for Mark [name], Mark [name] (MRN [id number]) as of 12/31/2019 08:16  Ref. Range 12/29/2019 19:19 12/29/2019 20:30 12/29/2019 21:35 12/29/2019 22:34 12/30/2019 00:10  Glucose-Capillary Latest Ref Range: 70 - 99 mg/dL 180 (H) 153 (H) 151 (H) 149 (H) 139 (H)     Diabetes history: DM1(does not make insulin.  Needs correction, basal and meal coverage)  Outpatient Diabetes medications: Novolog 70/30 15 units BID -TID + Lantus 25 units QOD (Patient reported) Current orders for Inpatient glycemic control: Novolog 0-9 TID + 0-5 QHS + Lantus 25 units QD  Inpatient Diabetes Program Recommendations:     Patient is homeless and does not have insurance. 70/30 insulin is what he normally takes.  -please consider Novolog 70/30 21 units BID -continue Novolog 0-9 TID with meals and 0-5 QHS   Note:  Spoke with patient yesterday at bedside.  He is homeless.  Confirmed he takes Novolog 70/30 15 units BID-TID and Lantus 25 units every other day.  Provided him with a meter.  Contacted [facility]; they are going to reach out to [facility] for any samples and make an appointment for him.  Will order insulin starter kit.  Patient needs to have insulins and needles delivered to him prior to DC from [place].  As he is a T1D, he should also use SSI TID with meals.  He was educated to check  his blood sugar AC & HS.  Reviewed hypoglycemia and treatment.    Insulin pen needle order # [id number]     Thank you, Reche Dixon, RN, BSN Diabetes Coordinator Inpatient Diabetes Program [phone] (team pager from 8a-5p)

## 2020-01-01 ENCOUNTER — Emergency Department (HOSPITAL_COMMUNITY)
Admission: EM | Admit: 2020-01-01 | Discharge: 2020-01-01 | Disposition: A | Discharge status: Home / Self Care | Payer: Self-pay | Attending: Emergency Medicine | Admitting: Emergency Medicine

## 2020-01-01 DIAGNOSIS — E860 Dehydration: Secondary | ICD-10-CM

## 2020-01-01 DIAGNOSIS — F17210 Nicotine dependence, cigarettes, uncomplicated: Secondary | ICD-10-CM | POA: Insufficient documentation

## 2020-01-01 DIAGNOSIS — R739 Hyperglycemia, unspecified: Secondary | ICD-10-CM

## 2020-01-01 DIAGNOSIS — E1065 Type 1 diabetes mellitus with hyperglycemia: Secondary | ICD-10-CM | POA: Insufficient documentation

## 2020-01-01 DIAGNOSIS — Z79899 Other long term (current) drug therapy: Secondary | ICD-10-CM | POA: Insufficient documentation

## 2020-01-01 LAB — BASIC METABOLIC PANEL
Anion gap: 18 — ABNORMAL HIGH (ref 5–15)
Anion gap: 14 (ref 5–15)
BUN: 12 mg/dL (ref 6–20)
BUN: 14 mg/dL (ref 6–20)
CO2: 17 mmol/L — ABNORMAL LOW (ref 22–32)
CO2: 19 mmol/L — ABNORMAL LOW (ref 22–32)
Calcium: 8.9 mg/dL (ref 8.9–10.3)
Calcium: 8.6 mg/dL — ABNORMAL LOW (ref 8.9–10.3)
Chloride: 101 mmol/L (ref 98–111)
Chloride: 101 mmol/L (ref 98–111)
Creatinine, Ser: 1.36 mg/dL — ABNORMAL HIGH (ref 0.61–1.24)
Creatinine, Ser: 1.36 mg/dL — ABNORMAL HIGH (ref 0.61–1.24)
GFR calc Af Amer: >60 mL/min (ref >60)
GFR calc Af Amer: >60 mL/min (ref >60)
GFR calc non Af Amer: >60 mL/min (ref >60)
GFR calc non Af Amer: >60 mL/min (ref >60)
Glucose, Bld: 726 mg/dL (ref 70–99)
Glucose, Bld: 510 mg/dL (ref 70–99)
Potassium: 4.3 mmol/L (ref 3.5–5.1)
Potassium: 4.5 mmol/L (ref 3.5–5.1)
Sodium: 136 mmol/L (ref 135–145)
Sodium: 134 mmol/L — ABNORMAL LOW (ref 135–145)

## 2020-01-01 LAB — CBC
HCT: 39.6 % (ref 39.0–52.0)
Hemoglobin: 12.4 g/dL — ABNORMAL LOW (ref 13.0–17.0)
MCH: 30 pg (ref 26.0–34.0)
MCHC: 31.3 g/dL (ref 30.0–36.0)
MCV: 95.9 fL (ref 80.0–100.0)
Platelets: 269 10*3/uL (ref 150–400)
RBC: 4.13 MIL/uL — ABNORMAL LOW (ref 4.22–5.81)
RDW: 13.1 % (ref 11.5–15.5)
WBC: 7.8 10*3/uL (ref 4.0–10.5)
nRBC: 0 % (ref 0.0–0.2)

## 2020-01-01 LAB — URINALYSIS, ROUTINE W REFLEX MICROSCOPIC
Bacteria, UA: NONE SEEN
Bilirubin Urine: NEGATIVE
Glucose, UA: >=500 mg/dL — AB
Hgb urine dipstick: NEGATIVE
Ketones, ur: 20 mg/dL — AB
Leukocytes,Ua: NEGATIVE
Nitrite: NEGATIVE
Protein, ur: NEGATIVE mg/dL
Specific Gravity, Urine: 1.023 (ref 1.005–1.030)
pH: 6 (ref 5.0–8.0)

## 2020-01-01 LAB — CBG MONITORING, ED
Glucose-Capillary: >600 mg/dL (ref 70–99)
Glucose-Capillary: >600 mg/dL (ref 70–99)
Glucose-Capillary: 582 mg/dL (ref 70–99)
Glucose-Capillary: 500 mg/dL — ABNORMAL HIGH (ref 70–99)

## 2020-01-01 MED ORDER — INSULIN ASPART 100 UNIT/ML ~~LOC~~ SOLN
5.0000 [IU] | Freq: Once | SUBCUTANEOUS | Status: AC
  Administered 2020-01-01: 5 [IU] via INTRAVENOUS

## 2020-01-01 MED ORDER — LACTATED RINGERS IV BOLUS
1000.0000 mL | Freq: Once | INTRAVENOUS | Status: AC
  Administered 2020-01-01: 15:00:00 1000 mL via INTRAVENOUS

## 2020-01-01 MED ORDER — ONDANSETRON HCL 4 MG/2ML IJ SOLN
4.0000 mg | Freq: Once | INTRAMUSCULAR | Status: AC
  Administered 2020-01-01: 15:00:00 4 mg via INTRAVENOUS
  Filled 2020-01-01: qty 2

## 2020-01-01 MED ORDER — INSULIN ASPART 100 UNIT/ML ~~LOC~~ SOLN
10.0000 [IU] | Freq: Once | SUBCUTANEOUS | Status: AC
  Administered 2020-01-01: 10 [IU] via INTRAVENOUS

## 2020-01-01 MED ORDER — LACTATED RINGERS IV BOLUS
1000.0000 mL | Freq: Once | INTRAVENOUS | Status: AC
  Administered 2020-01-01: 1000 mL via INTRAVENOUS

## 2020-01-01 NOTE — ED Notes (Signed)
Pt verbalized understanding of discharge instructions and follow up information. Pt states he will monitor his glucose and take his insulin as prescribed. Bus voucher given to pt. Pt ambulatory and steady on his feet.

## 2020-01-01 NOTE — ED Triage Notes (Signed)
Came in via ems; c/o high blood sugar; reported patient is homeless,

## 2020-01-01 NOTE — ED Notes (Signed)
Pt not answering for room

## 2020-01-01 NOTE — ED Provider Notes (Signed)
  Provider Note MRN:  [id number]  Arrival date & time: 01/01/20    ED Course and Medical Decision Making  Assumed care from Dr. Rubin Payor at shift change.  Patient's labs were rechecked after 2 L of fluid and total of 15 units of IV insulin.  Base deficit improving, gap is closed.  Glucose now 500 and will likely continue to decrease over the next couple hours.  No longer with indication for admission, patient has his medications, patient has follow-up with the [facility] health wellness center.  Appropriate for discharge.  Procedures  Final Clinical Impressions(s) / ED Diagnoses     ICD-10-CM   1. Hyperglycemia  R73.9   2. Dehydration  E86.0     ED Discharge Orders    None        Discharge Instructions     You were evaluated in the Emergency Department and after careful evaluation, we did not find any emergent condition requiring admission or further testing in the hospital.  Your exam/testing today is overall reassuring.  Please take your medications as directed and follow-up with the [facility] health community health and wellness center.  Please call the number provided to ask when your appointment is.  Please return to the Emergency Department if you experience any worsening of your condition.  We encourage you to follow up with a primary care provider.  Thank you for allowing [place] to be a part of your care.    Elmer Sow. Pilar Plate, MD [facility] Emergency Medicine [facility] [email or web address]    Sabas Sous, MD 01/01/20 1816

## 2020-01-01 NOTE — Discharge Instructions (Addendum)
You were evaluated in the Emergency Department and after careful evaluation, we did not find any emergent condition requiring admission or further testing in the hospital.  Your exam/testing today is overall reassuring.  Please take your medications as directed and follow-up with the [facility] health community health and wellness center.  Please call the number provided to ask when your appointment is.  Please return to the Emergency Department if you experience any worsening of your condition.  We encourage you to follow up with a primary care provider.  Thank you for allowing [place] to be a part of your care.

## 2020-01-01 NOTE — ED Provider Notes (Signed)
[place] EMERGENCY DEPARTMENT Provider Note   CSN: [id number] Arrival date & time: 01/01/20  1154     History Chief Complaint  Patient presents with  . Hyperglycemia    Mark [name] is a 34 y.o. male.  HPI Patient presents with hyperglycemia.  History of same with DKA.  Discharged yesterday.  Is homeless and has had difficulty getting his medicines, however he was given them yesterday and states he took them this morning.  However found to be in DKA.  States he has been having high sugars and has been throwing up.    Past Medical History:  Diagnosis Date  . Depression   . Diabetes mellitus without complication ([place])   . Hydrocephalus ([place])   . Kidney stones   . Seizures [facility])     Patient Active Problem List   Diagnosis Date Noted  . DKA, type 1 ([place]) 12/30/2019  . DKA (diabetic ketoacidosis) ([place]) 12/29/2019  . Homelessness 12/29/2019  . Marijuana abuse 12/29/2019  . Tobacco dependence 12/29/2019  . Seizure disorder ([place]) 12/29/2019  . Suicidal ideation   . Bipolar I disorder, most recent episode depressed, severe without psychotic features ([place])   . Adjustment disorder with depressed mood 12/07/2017  . Noncompliance 10/17/2017  . Dysthymia 10/08/2017  . Personality disorder in adult [facility]) 09/26/2017  . 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 Obstructive hydrocephalus ([place]) 09/19/2017  . Diabetes ([place]) 09/11/2017  . Hypoglycemia 09/09/2017  . MDD (major depressive disorder) 08/22/2017  . Severe recurrent major depression with psychotic features ([place]) 08/19/2017  . Nausea & vomiting 08/18/2017  . Abdominal pain 08/18/2017  . Hyponatremia 08/18/2017  . DKA (diabetic ketoacidoses) ([place]) 11/24/2015  . Diabetic keto-acidosis ([place]) 10/23/2015    Past Surgical History:  Procedure Laterality Date  . KIDNEY STONE SURGERY         Family History  Problem Relation Age of Onset  . Breast cancer Mother     Social History   Tobacco Use  . Smoking  status: Current Every Day Smoker    Packs/day: 0.75    Years: 20.00    Pack years: 15.00    Types: Cigarettes  . Smokeless tobacco: Never Used  Substance Use Topics  . Alcohol use: Never    Comment: occassional   . Drug use: Yes    Types: Marijuana    Comment: smoked 2-3 days ago    Home Medications Prior to Admission medications   Medication Sig Start Date End Date Taking? Authorizing Provider  blood glucose meter kit and supplies KIT Dispense based on patient and insurance preference. Use up to four times daily as directed. (FOR ICD-9 250.00, 250.01). 12/31/19   Amin, Jeanella Flattery, MD  divalproex (DEPAKOTE) 500 MG DR tablet Take 1 tablet (500 mg total) by mouth every 12 (twelve) hours. 12/31/19   Amin, Jeanella Flattery, MD  escitalopram (LEXAPRO) 10 MG tablet Take 1 tablet (10 mg total) by mouth daily. 12/31/19   Amin, Jeanella Flattery, MD  insulin aspart (NOVOLOG) 100 UNIT/ML injection Blood Sugar: 60-110= none,111-150= 2 unit, 151-200= 4 unit, 201-250= 6 unit, 251-300= 8 unit, 301-350= 10 unit, >350=   12 units Call MD 12/31/19   Damita Lack, MD  insulin aspart protamine- aspart (NOVOLOG MIX 70/30) (70-30) 100 UNIT/ML injection Inject 0.21 mLs (21 Units total) into the skin 2 (two) times daily before a meal. 12/31/19   Amin, Jeanella Flattery, MD  Insulin Pen Needle (PEN NEEDLES 3/16") 31G X 5 MM MISC 1 Dose by  Does not apply route 3 (three) times daily. 12/31/19   Amin, Jeanella Flattery, MD  levothyroxine (SYNTHROID) 100 MCG tablet Take 1 tablet (100 mcg total) by mouth daily before breakfast. 12/31/19   Amin, Jeanella Flattery, MD  pregabalin (LYRICA) 150 MG capsule Take 1 capsule (150 mg total) by mouth 2 (two) times daily. 12/31/19   Amin, Jeanella Flattery, MD  QUEtiapine (SEROQUEL) 100 MG tablet Take 1 tablet (100 mg total) by mouth at bedtime. 12/31/19   Amin, Jeanella Flattery, MD    Allergies    Mushroom extract complex, Penicillins, Sulfa antibiotics, and Clindamycin/lincomycin  Review of Systems     Review of Systems  Constitutional: Positive for appetite change. Negative for fever.  HENT: Negative for congestion.   Cardiovascular: Negative for chest pain.  Gastrointestinal: Positive for nausea and vomiting.  Genitourinary: Negative for flank pain.  Musculoskeletal: Negative for back pain.  Skin: Negative for rash.  Neurological: Negative for weakness.  Psychiatric/Behavioral: Negative for confusion.    Physical Exam Updated Vital Signs BP 121/65   Pulse (!) 110   Temp 98.7 F (37.1 C) (Oral)   Resp 17   Ht '6\' 2"'$  (1.88 m)   Wt 113.4 kg   SpO2 94%   BMI 32.10 kg/m   Physical Exam Vitals and nursing note reviewed.  HENT:     Head: Normocephalic.  Eyes:     Extraocular Movements: Extraocular movements intact.  Cardiovascular:     Rate and Rhythm: Regular rhythm.  Abdominal:     Tenderness: There is no abdominal tenderness.  Musculoskeletal:        General: No tenderness.     Cervical back: Neck supple.  Skin:    General: Skin is warm.     Capillary Refill: Capillary refill takes less than 2 seconds.  Neurological:     Mental Status: He is alert and oriented to person, place, and time.     ED Results / Procedures / Treatments   Labs (all labs ordered are listed, but only abnormal results are displayed) Labs Reviewed  BASIC METABOLIC PANEL - Abnormal; Notable for the following components:      Result Value   CO2 17 (*)    Glucose, Bld 726 (*)    Creatinine, Ser 1.36 (*)    Anion gap 18 (*)    All other components within normal limits  CBC - Abnormal; Notable for the following components:   RBC 4.13 (*)    Hemoglobin 12.4 (*)    All other components within normal limits  URINALYSIS, ROUTINE W REFLEX MICROSCOPIC - Abnormal; Notable for the following components:   Color, Urine STRAW (*)    Glucose, UA >=500 (*)    Ketones, ur 20 (*)    All other components within normal limits  CBG MONITORING, ED - Abnormal; Notable for the following components:    Glucose-Capillary >600 (*)    All other components within normal limits  CBG MONITORING, ED - Abnormal; Notable for the following components:   Glucose-Capillary >600 (*)    All other components within normal limits  CBG MONITORING, ED - Abnormal; Notable for the following components:   Glucose-Capillary 582 (*)    All other components within normal limits    EKG None  Radiology No results found.  Procedures Procedures (including critical care time)  Medications Ordered in ED Medications  lactated ringers bolus 1,000 mL (1,000 mLs Intravenous New Bag/Given 01/01/20 1431)  lactated ringers bolus 1,000 mL (1,000 mLs Intravenous New Bag/Given  01/01/20 1430)  insulin aspart (novoLOG) injection 10 Units (10 Units Intravenous Given 01/01/20 1441)  ondansetron (ZOFRAN) injection 4 mg (4 mg Intravenous Given 01/01/20 1441)    ED Course  I have reviewed the triage vital signs and the nursing notes.  Pertinent labs & imaging results that were available during my care of the patient were reviewed by me and considered in my medical decision making (see chart for details).    MDM Rules/Calculators/A&P                      Patient presents with hyperglycemia is a mild DKA.  Discharge yesterday for same.  Questionable compliance.  Mild tachycardia.  Rather well-appearing.  Will try fluid boluses and IV insulin.  Hopefully will be able to be discharged to the ER but will see how you are response.  Care turned over to Dr. Sedonia Small.  CRITICAL CARE Performed by: Davonna Belling Total critical care time: 30 minutes Critical care time was exclusive of separately billable procedures and treating other patients. Critical care was necessary to treat or prevent imminent or life-threatening deterioration. Critical care was time spent personally by me on the following activities: development of treatment plan with patient and/or surrogate as well as nursing, discussions with consultants, evaluation of  patient's response to treatment, examination of patient, obtaining history from patient or surrogate, ordering and performing treatments and interventions, ordering and review of laboratory studies, ordering and review of radiographic studies, pulse oximetry and re-evaluation of patient's condition.  Final Clinical Impression(s) / ED Diagnoses Final diagnoses:  Diabetic ketoacidosis without coma associated with type 1 diabetes mellitus [facility])    Rx / DC Orders ED Discharge Orders    None       Davonna Belling, MD 01/01/20 1557

## 2020-01-02 ENCOUNTER — Emergency Department (HOSPITAL_COMMUNITY): Payer: Self-pay

## 2020-01-02 ENCOUNTER — Other Ambulatory Visit: Payer: Self-pay

## 2020-01-02 ENCOUNTER — Encounter (HOSPITAL_COMMUNITY): Payer: Self-pay

## 2020-01-02 ENCOUNTER — Emergency Department (HOSPITAL_COMMUNITY)
Admission: EM | Admit: 2020-01-02 | Discharge: 2020-01-05 | Disposition: A | Discharge status: Home / Self Care | Payer: Self-pay | Attending: Emergency Medicine | Admitting: Emergency Medicine

## 2020-01-02 DIAGNOSIS — F1220 Cannabis dependence, uncomplicated: Secondary | ICD-10-CM | POA: Insufficient documentation

## 2020-01-02 DIAGNOSIS — F332 Major depressive disorder, recurrent severe without psychotic features: Secondary | ICD-10-CM | POA: Insufficient documentation

## 2020-01-02 DIAGNOSIS — Y999 Unspecified external cause status: Secondary | ICD-10-CM | POA: Insufficient documentation

## 2020-01-02 DIAGNOSIS — Y929 Unspecified place or not applicable: Secondary | ICD-10-CM | POA: Insufficient documentation

## 2020-01-02 DIAGNOSIS — S61432A Puncture wound without foreign body of left hand, initial encounter: Secondary | ICD-10-CM | POA: Insufficient documentation

## 2020-01-02 DIAGNOSIS — Y939 Activity, unspecified: Secondary | ICD-10-CM | POA: Insufficient documentation

## 2020-01-02 DIAGNOSIS — F4321 Adjustment disorder with depressed mood: Secondary | ICD-10-CM | POA: Diagnosis present

## 2020-01-02 DIAGNOSIS — Z79899 Other long term (current) drug therapy: Secondary | ICD-10-CM | POA: Insufficient documentation

## 2020-01-02 DIAGNOSIS — E876 Hypokalemia: Secondary | ICD-10-CM

## 2020-01-02 DIAGNOSIS — Z23 Encounter for immunization: Secondary | ICD-10-CM | POA: Insufficient documentation

## 2020-01-02 DIAGNOSIS — W01198A Fall on same level from slipping, tripping and stumbling with subsequent striking against other object, initial encounter: Secondary | ICD-10-CM | POA: Insufficient documentation

## 2020-01-02 DIAGNOSIS — R45851 Suicidal ideations: Secondary | ICD-10-CM

## 2020-01-02 DIAGNOSIS — E109 Type 1 diabetes mellitus without complications: Secondary | ICD-10-CM | POA: Insufficient documentation

## 2020-01-02 DIAGNOSIS — F17210 Nicotine dependence, cigarettes, uncomplicated: Secondary | ICD-10-CM | POA: Insufficient documentation

## 2020-01-02 DIAGNOSIS — Z590 Homelessness: Secondary | ICD-10-CM | POA: Insufficient documentation

## 2020-01-02 DIAGNOSIS — Z20822 Contact with and (suspected) exposure to covid-19: Secondary | ICD-10-CM | POA: Insufficient documentation

## 2020-01-02 DIAGNOSIS — F314 Bipolar disorder, current episode depressed, severe, without psychotic features: Secondary | ICD-10-CM | POA: Diagnosis present

## 2020-01-02 LAB — COMPREHENSIVE METABOLIC PANEL
ALT: 19 U/L (ref 0–44)
AST: 34 U/L (ref 15–41)
Albumin: 4 g/dL (ref 3.5–5.0)
Alkaline Phosphatase: 91 U/L (ref 38–126)
Anion gap: 13 (ref 5–15)
BUN: 8 mg/dL (ref 6–20)
CO2: 23 mmol/L (ref 22–32)
Calcium: 9.1 mg/dL (ref 8.9–10.3)
Chloride: 104 mmol/L (ref 98–111)
Creatinine, Ser: 1.05 mg/dL (ref 0.61–1.24)
GFR calc Af Amer: >60 mL/min (ref >60)
GFR calc non Af Amer: >60 mL/min (ref >60)
Glucose, Bld: 306 mg/dL — ABNORMAL HIGH (ref 70–99)
Potassium: 3.2 mmol/L — ABNORMAL LOW (ref 3.5–5.1)
Sodium: 140 mmol/L (ref 135–145)
Total Bilirubin: 0.3 mg/dL (ref 0.3–1.2)
Total Protein: 7.2 g/dL (ref 6.5–8.1)

## 2020-01-02 LAB — RAPID URINE DRUG SCREEN, HOSP PERFORMED
Amphetamines: NOT DETECTED
Barbiturates: NOT DETECTED
Benzodiazepines: NOT DETECTED
Cocaine: NOT DETECTED
Opiates: NOT DETECTED
Tetrahydrocannabinol: POSITIVE — AB

## 2020-01-02 LAB — URINALYSIS, ROUTINE W REFLEX MICROSCOPIC
Bacteria, UA: NONE SEEN
Bilirubin Urine: NEGATIVE
Glucose, UA: 150 mg/dL — AB
Ketones, ur: NEGATIVE mg/dL
Leukocytes,Ua: NEGATIVE
Nitrite: NEGATIVE
Protein, ur: NEGATIVE mg/dL
Specific Gravity, Urine: 1.034 — ABNORMAL HIGH (ref 1.005–1.030)
pH: 6 (ref 5.0–8.0)

## 2020-01-02 LAB — CBC
HCT: 35.6 % — ABNORMAL LOW (ref 39.0–52.0)
Hemoglobin: 11.7 g/dL — ABNORMAL LOW (ref 13.0–17.0)
MCH: 30.5 pg (ref 26.0–34.0)
MCHC: 32.9 g/dL (ref 30.0–36.0)
MCV: 93 fL (ref 80.0–100.0)
Platelets: 280 10*3/uL (ref 150–400)
RBC: 3.83 MIL/uL — ABNORMAL LOW (ref 4.22–5.81)
RDW: 13.2 % (ref 11.5–15.5)
WBC: 7.9 10*3/uL (ref 4.0–10.5)
nRBC: 0 % (ref 0.0–0.2)

## 2020-01-02 LAB — CBG MONITORING, ED: Glucose-Capillary: 210 mg/dL — ABNORMAL HIGH (ref 70–99)

## 2020-01-02 LAB — ACETAMINOPHEN LEVEL: Acetaminophen (Tylenol), Serum: <10 ug/mL — ABNORMAL LOW (ref 10–30)

## 2020-01-02 LAB — SALICYLATE LEVEL: Salicylate Lvl: <7 mg/dL — ABNORMAL LOW (ref 7.0–30.0)

## 2020-01-02 LAB — ETHANOL: Alcohol, Ethyl (B): <10 mg/dL (ref <10)

## 2020-01-02 MED ORDER — INSULIN ASPART PROT & ASPART (70-30 MIX) 100 UNIT/ML ~~LOC~~ SUSP
21.0000 [IU] | Freq: Two times a day (BID) | SUBCUTANEOUS | Status: DC
  Administered 2020-01-03 – 2020-01-04 (×3): 21 [IU] via SUBCUTANEOUS
  Filled 2020-01-02: qty 10

## 2020-01-02 MED ORDER — ESCITALOPRAM OXALATE 10 MG PO TABS
10.0000 mg | ORAL_TABLET | Freq: Every day | ORAL | Status: DC
  Administered 2020-01-03 – 2020-01-05 (×3): 10 mg via ORAL
  Filled 2020-01-02 (×4): qty 1

## 2020-01-02 MED ORDER — DIPHENHYDRAMINE-APAP (SLEEP) 25-500 MG PO TABS: 2.0000 | ORAL_TABLET | Freq: Every day | ORAL | Status: DC

## 2020-01-02 MED ORDER — PREGABALIN 50 MG PO CAPS
150.0000 mg | ORAL_CAPSULE | Freq: Two times a day (BID) | ORAL | Status: DC
  Administered 2020-01-02 – 2020-01-05 (×6): 150 mg via ORAL
  Filled 2020-01-02 (×6): qty 3

## 2020-01-02 MED ORDER — TETANUS-DIPHTH-ACELL PERTUSSIS 5-2.5-18.5 LF-MCG/0.5 IM SUSP
0.5000 mL | Freq: Once | INTRAMUSCULAR | Status: AC
  Administered 2020-01-02: 0.5 mL via INTRAMUSCULAR
  Filled 2020-01-02: qty 0.5

## 2020-01-02 MED ORDER — POTASSIUM CHLORIDE CRYS ER 20 MEQ PO TBCR
40.0000 meq | EXTENDED_RELEASE_TABLET | Freq: Every day | ORAL | Status: AC
  Administered 2020-01-03 – 2020-01-05 (×3): 40 meq via ORAL
  Filled 2020-01-02 (×3): qty 2

## 2020-01-02 MED ORDER — INSULIN ASPART 100 UNIT/ML ~~LOC~~ SOLN
0.0000 [IU] | SUBCUTANEOUS | Status: DC | PRN
  Filled 2020-01-02: qty 0.12

## 2020-01-02 MED ORDER — POTASSIUM CHLORIDE CRYS ER 20 MEQ PO TBCR
40.0000 meq | EXTENDED_RELEASE_TABLET | Freq: Once | ORAL | Status: AC
  Administered 2020-01-02: 40 meq via ORAL
  Filled 2020-01-02: qty 2

## 2020-01-02 MED ORDER — ACETAMINOPHEN 500 MG PO TABS: 1000.0000 mg | ORAL_TABLET | Freq: Every evening | ORAL | Status: DC | PRN

## 2020-01-02 MED ORDER — DIPHENHYDRAMINE HCL 25 MG PO CAPS: 50.0000 mg | ORAL_CAPSULE | Freq: Every evening | ORAL | Status: DC | PRN

## 2020-01-02 MED ORDER — LEVOTHYROXINE SODIUM 100 MCG PO TABS
100.0000 ug | ORAL_TABLET | Freq: Every day | ORAL | Status: DC
  Administered 2020-01-03 – 2020-01-05 (×3): 100 ug via ORAL
  Filled 2020-01-02 (×3): qty 1

## 2020-01-02 MED ORDER — DIVALPROEX SODIUM 500 MG PO DR TAB
500.0000 mg | DELAYED_RELEASE_TABLET | Freq: Two times a day (BID) | ORAL | Status: DC
  Administered 2020-01-02 – 2020-01-05 (×6): 500 mg via ORAL
  Filled 2020-01-02 (×6): qty 1

## 2020-01-02 MED ORDER — QUETIAPINE FUMARATE 100 MG PO TABS
100.0000 mg | ORAL_TABLET | Freq: Every day | ORAL | Status: DC
  Administered 2020-01-02 – 2020-01-04 (×3): 100 mg via ORAL
  Filled 2020-01-02 (×3): qty 1
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
## 2020-01-02 NOTE — ED Notes (Signed)
Pt states that he needs a room with a TV.
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
## 2020-01-03 LAB — SARS CORONAVIRUS 2 (TAT 6-24 HRS): SARS Coronavirus 2: NEGATIVE

## 2020-01-03 LAB — CBG MONITORING, ED
Glucose-Capillary: 493 mg/dL — ABNORMAL HIGH (ref 70–99)
Glucose-Capillary: 461 mg/dL — ABNORMAL HIGH (ref 70–99)
Glucose-Capillary: 330 mg/dL — ABNORMAL HIGH (ref 70–99)
Glucose-Capillary: 336 mg/dL — ABNORMAL HIGH (ref 70–99)

## 2020-01-03 MED ORDER — TRAZODONE HCL 50 MG PO TABS
50.0000 mg | ORAL_TABLET | Freq: Every day | ORAL | Status: DC
  Administered 2020-01-03 – 2020-01-04 (×2): 50 mg via ORAL
  Filled 2020-01-03 (×2): qty 1

## 2020-01-03 MED ORDER — INSULIN ASPART 100 UNIT/ML ~~LOC~~ SOLN
0.0000 [IU] | Freq: Three times a day (TID) | SUBCUTANEOUS | Status: DC
  Administered 2020-01-03: 7 [IU] via SUBCUTANEOUS
  Administered 2020-01-04: 9 [IU] via SUBCUTANEOUS
  Filled 2020-01-03: qty 0.09
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
## 2020-01-03 NOTE — Patient Outreach (Signed)
ED Peer Support Specialist Patient Intake (Complete at intake & 30-60 Day Follow-up)  Name: Mark [name]  MRN: [id number]  Age: 34 y.o.   Date of Admission: 01/03/2020  Intake: Initial Comments:      Primary Reason Admitted: Suicidal ideation   Lab values: Alcohol/ETOH: Not completed Positive UDS? Drug screen not completed Amphetamines: No Barbiturates: No Benzodiazepines: No Cocaine: No Opiates: No Cannabinoids: No  Demographic information: Gender: Male Ethnicity: White Marital Status: Single Insurance Status: Uninsured/Self-pay Ecologist (Work Neurosurgeon, Physicist, medical, etc.: No Lives with: Other (comment) Living situation: Homeless  Reported Patient History: Patient reported health conditions: Other (comment), Diabetes(TBI) Patient aware of HIV and hepatitis status: No  In past year, has patient visited ED for any reason? No  Number of ED visits:    Reason(s) for visit:    In past year, has patient been hospitalized for any reason? No  Number of hospitalizations:    Reason(s) for hospitalization:    In past year, has patient been arrested? No  Number of arrests:    Reason(s) for arrest:    In past year, has patient been incarcerated? No  Number of incarcerations:    Reason(s) for incarceration:    In past year, has patient received medication-assisted treatment? No  In past year, patient received the following treatments: Other (comment)  In past year, has patient received any harm reduction services? No  Did this include any of the following?    In past year, has patient received care from a mental health provider for diagnosis other than SUD? No  In past year, is this first time patient has overdosed? No  Number of past overdoses:    In past year, is this first time patient has been hospitalized for an overdose? No  Number of hospitalizations for overdose(s):    Is patient currently receiving treatment for a  mental health diagnosis? No  Patient reports experiencing difficulty participating in SUD treatment: No    Most important reason(s) for this difficulty?    Has patient received prior services for treatment? No  In past, patient has received services from following agencies:    Plan of Care:  Suggested follow up at these agencies/treatment centers: Other (comment)  Other information:  CPSS met with Pt an was able to complete series of questions and to better understand how to assist Pt. CPSS was made aware that he is homeless and has had a Brain Surgery, Pt stated that he wants to get into a facility that can help him get set up with benefits. CPSS recommended [facility], for a few reasons. Pt agreed to think about it an will let CPSS know something by the AM.     London Sheer, CPSS  01/03/2020 1:32 PM

## 2020-01-04 ENCOUNTER — Encounter (HOSPITAL_COMMUNITY): Payer: Self-pay | Admitting: Registered Nurse

## 2020-01-04 LAB — CBG MONITORING, ED
Glucose-Capillary: 481 mg/dL — ABNORMAL HIGH (ref 70–99)
Glucose-Capillary: 149 mg/dL — ABNORMAL HIGH (ref 70–99)
Glucose-Capillary: 93 mg/dL (ref 70–99)
Glucose-Capillary: 171 mg/dL — ABNORMAL HIGH (ref 70–99)

## 2020-01-04 MED ORDER — INSULIN ASPART 100 UNIT/ML ~~LOC~~ SOLN
0.0000 [IU] | Freq: Every day | SUBCUTANEOUS | Status: DC
  Filled 2020-01-04: qty 0.05

## 2020-01-04 MED ORDER — INSULIN ASPART 100 UNIT/ML ~~LOC~~ SOLN
0.0000 [IU] | Freq: Three times a day (TID) | SUBCUTANEOUS | Status: DC
  Administered 2020-01-04: 2 [IU] via SUBCUTANEOUS
  Administered 2020-01-05: 7 [IU] via SUBCUTANEOUS
  Administered 2020-01-05: 15 [IU] via SUBCUTANEOUS
  Filled 2020-01-04: qty 0.15

## 2020-01-04 MED ORDER — INSULIN ASPART PROT & ASPART (70-30 MIX) 100 UNIT/ML ~~LOC~~ SUSP
11.0000 [IU] | Freq: Once | SUBCUTANEOUS | Status: AC
  Administered 2020-01-04: 11 [IU] via SUBCUTANEOUS
  Filled 2020-01-04: qty 10

## 2020-01-04 MED ORDER — INSULIN ASPART PROT & ASPART (70-30 MIX) 100 UNIT/ML ~~LOC~~ SUSP
16.0000 [IU] | Freq: Once | SUBCUTANEOUS | Status: AC
  Administered 2020-01-04: 16 [IU] via SUBCUTANEOUS
  Filled 2020-01-04: qty 10

## 2020-01-04 MED ORDER — INSULIN ASPART PROT & ASPART (70-30 MIX) 100 UNIT/ML ~~LOC~~ SUSP
32.0000 [IU] | Freq: Two times a day (BID) | SUBCUTANEOUS | Status: DC
  Administered 2020-01-05: 32 [IU] via SUBCUTANEOUS
  Filled 2020-01-04: qty 10

## 2020-01-04 NOTE — ED Notes (Signed)
Woke up and asked for new scrubs and needed a bed change. Urine soaked linen and scrubs. He had urine in the urinal at his bedside and used the bathroom as well. Bed changed and provided clean scrubs. Asked for something to drink and crackers. Told he could have a drink but not a snack at this hour, didn't give food out at 0400 and his sugar has been running high so another reason to limit food. He responded he hated "[facility] [place]" when asked why, states they deny him food when he is hungry. Told him again we were looking out for his well being as well as following unit policy. Did give him a drink, just not food.he did thank staff for changing his linen before he got upset about the food.

## 2020-01-04 NOTE — ED Provider Notes (Addendum)
7:34 AM on January 04, 2020.  The patient appears stable so that the remainder of the MSE may be completed by another provider. 34 year old male history of diabetes who presents today with reports of suicidal thoughts Vitals:   01/03/20 2100 01/04/20 0628  BP: 126/79 104/74  Pulse: 75 60  Resp: 16 16  Temp: 98.6 F (37 C) 98.1 F (36.7 C)  SpO2: 97% 96%   Review of labs reveal hyperglycemia with no evidence of DKA Blood sugars have been running in the 300s Review of records from the consult revealed that patient is recommended for inpatient admission when medically cleared Patient is medically clear for psychiatric admission Discussed with diabetes coordinator and current 7030 and sliding scale discontinued and 7030 increased to 32 units twice daily and sliding scale to moderate resistance coverage    Margarita Grizzle, MD 01/04/20 0982    Margarita Grizzle, MD 01/04/20 8675    Margarita Grizzle, MD 01/05/20 1138

## 2020-01-04 NOTE — Progress Notes (Signed)
Received Tiffany at the change of shift with the sitter at the bedside. He was given a snack and several diet gingerales this PM. His BS was 171 at 2130 hrs. He was compliant with his medications. Later he was irritated when limits were set with the  diet gingierales. He woke up in urine during the night. He showered and his bed linens were changed.

## 2020-01-04 NOTE — ED Notes (Signed)
Pt alert and oriented. Cooperative with care and medication. Pt flat, dull, blunted, irritable guarded. Pt resting comfortably in bed.
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
## 2020-01-04 NOTE — Consult Note (Signed)
[facility] Psych ED Progress Note  01/04/2020 10:33 AM Mark [name]  MRN:  [id number]   Subjective:  Mark [name], 34 y.o., male patient seen via tele psych by this provider, consulted with Dr. Mallie Darting; and chart reviewed on 01/04/20.  On evaluation Mark [name] reports he is feeling the same.  "I'm still having some suicidal thoughts, depression, anxiety, and overall I'm feeling down."  Patient reports he is tolerating his medication, eating without any difficulty, " but I don't feel like I'm getting enough to eat."  Patient also states that he slept okay last night but would've slept better if he was getting more Seroquel.  "I usually get 300-400 mg but they're only giving me 100 mg.  At this time patient denies homicidal ideation, psychosis, paranoia. During evaluation Mark [name] is alert/oriented x 4; calm/cooperative; and mood is congruent with affect.  He does not appear to be responding to internal/external stimuli or delusional thoughts.  Patient denies homicidal ideation, psychosis, and paranoia but continues to endorse suicidal ideation.  Patient answered question appropriately.  Possibility that patient is malingering for secondary gain (homelessness). Patient is also non-compliant with outpatient services and medication.  Medications have been started will monitor for 24 hours and reassess tomorrow.  No beds available at [facility] Observation Unit at this time.     Principal Problem: Adjustment disorder with depressed mood Diagnosis:  Principal Problem:   Adjustment disorder with depressed mood Active Problems:   Bipolar I disorder, most recent episode depressed, severe without psychotic features ([place])  Total Time spent with patient: 30 minutes  Past Psychiatric History: See above  Past Medical History:  Past Medical History:  Diagnosis Date  . Depression   . Diabetes mellitus without complication ([place])   . Hydrocephalus ([place])   . Kidney stones   . Seizures ([place])      Past Surgical History:  Procedure Laterality Date  . KIDNEY STONE SURGERY     Family History:  Family History  Problem Relation Age of Onset  . Breast cancer Mother    Family Psychiatric  History: Unknown Social History:  Social History   Substance and Sexual Activity  Alcohol Use Never   Comment: occassional      Social History   Substance and Sexual Activity  Drug Use Yes  . Types: Marijuana   Comment: smoked 2-3 days ago    Social History   Socioeconomic History  . Marital status: Single    Spouse name: Not on file  . Number of children: Not on file  . Years of education: Not on file  . Highest education level: Not on file  Occupational History  . Not on file  Tobacco Use  . Smoking status: Current Every Day Smoker    Packs/day: 0.75    Years: 20.00    Pack years: 15.00    Types: Cigarettes  . Smokeless tobacco: Never Used  Substance and Sexual Activity  . Alcohol use: Never    Comment: occassional   . Drug use: Yes    Types: Marijuana    Comment: smoked 2-3 days ago  . Sexual activity: Not on file  Other Topics Concern  . Not on file  Social History Narrative  . Not on file   Social Determinants of Health   Financial Resource Strain:   . Difficulty of Paying Living Expenses:   Food Insecurity:   . Worried About Charity fundraiser in the Last Year:   . Ran  Out of Food in the Last Year:   Transportation Needs:   . Lack of Transportation (Medical):   Marland Kitchen Lack of Transportation (Non-Medical):   Physical Activity:   . Days of Exercise per Week:   . Minutes of Exercise per Session:   Stress:   . Feeling of Stress :   Social Connections:   . Frequency of Communication with Friends and Family:   . Frequency of Social Gatherings with Friends and Family:   . Attends Religious Services:   . Active Member of Clubs or Organizations:   . Attends Archivist Meetings:   Marland Kitchen Marital Status:     Sleep: Fair  Appetite:  Good  Current  Medications: Current Facility-Administered Medications  Medication Dose Route Frequency Provider Last Rate Last Admin  . acetaminophen (TYLENOL) tablet 1,000 mg  1,000 mg Oral QHS PRN Wyvonnia Dusky, MD       And  . diphenhydrAMINE (BENADRYL) capsule 50 mg  50 mg Oral QHS PRN Wyvonnia Dusky, MD      . divalproex (DEPAKOTE) DR tablet 500 mg  500 mg Oral Q12H Caccavale, Sophia, PA-C   500 mg at 01/04/20 0939  . escitalopram (LEXAPRO) tablet 10 mg  10 mg Oral Daily Caccavale, Sophia, PA-C   10 mg at 01/04/20 0939  . insulin aspart (novoLOG) injection 0-15 Units  0-15 Units Subcutaneous TID WC Ray, Andee Poles, MD      . insulin aspart (novoLOG) injection 0-5 Units  0-5 Units Subcutaneous QHS Pattricia Boss, MD      . insulin aspart protamine- aspart (NOVOLOG MIX 70/30) injection 32 Units  32 Units Subcutaneous BID WC Ray, Andee Poles, MD      . levothyroxine (SYNTHROID) tablet 100 mcg  100 mcg Oral QAC breakfast Caccavale, Sophia, PA-C   100 mcg at 01/04/20 2778  . potassium chloride SA (KLOR-CON) CR tablet 40 mEq  40 mEq Oral Daily Caccavale, Sophia, PA-C   40 mEq at 01/04/20 0938  . pregabalin (LYRICA) capsule 150 mg  150 mg Oral BID Caccavale, Sophia, PA-C   150 mg at 01/04/20 0938  . QUEtiapine (SEROQUEL) tablet 100 mg  100 mg Oral QHS Caccavale, Sophia, PA-C   100 mg at 01/03/20 2138  . traZODone (DESYREL) tablet 50 mg  50 mg Oral QHS Corena Pilgrim, MD   50 mg at 01/03/20 2138   Current Outpatient Medications  Medication Sig Dispense Refill  . diphenhydramine-acetaminophen (TYLENOL PM) 25-500 MG TABS tablet Take 2 tablets by mouth at bedtime.    . divalproex (DEPAKOTE) 500 MG DR tablet Take 1 tablet (500 mg total) by mouth every 12 (twelve) hours. 60 tablet 0  . escitalopram (LEXAPRO) 10 MG tablet Take 1 tablet (10 mg total) by mouth daily. 30 tablet 0  . insulin aspart (NOVOLOG) 100 UNIT/ML injection Blood Sugar: 60-110= none,111-150= 2 unit, 151-200= 4 unit, 201-250= 6 unit, 251-300= 8  unit, 301-350= 10 unit, >350=   12 units Call MD 10 mL 0  . insulin aspart protamine- aspart (NOVOLOG MIX 70/30) (70-30) 100 UNIT/ML injection Inject 0.21 mLs (21 Units total) into the skin 2 (two) times daily before a meal. 10 mL 0  . levothyroxine (SYNTHROID) 100 MCG tablet Take 1 tablet (100 mcg total) by mouth daily before breakfast. 30 tablet 0  . pregabalin (LYRICA) 150 MG capsule Take 1 capsule (150 mg total) by mouth 2 (two) times daily. 60 capsule 0  . QUEtiapine (SEROQUEL) 100 MG tablet Take 1 tablet (100 mg total)  by mouth at bedtime. 30 tablet 0  . blood glucose meter kit and supplies KIT Dispense based on patient and insurance preference. Use up to four times daily as directed. (FOR ICD-9 250.00, 250.01). 1 each 0  . Insulin Pen Needle (PEN NEEDLES 3/16") 31G X 5 MM MISC 1 Dose by Does not apply route 3 (three) times daily. 100 each 0    Lab Results:  Results for orders placed or performed during the hospital encounter of 01/02/20 (from the past 48 hour(s))  Comprehensive metabolic panel     Status: Abnormal   Collection Time: 01/02/20  8:02 PM  Result Value Ref Range   Sodium 140 135 - 145 mmol/L   Potassium 3.2 (L) 3.5 - 5.1 mmol/L   Chloride 104 98 - 111 mmol/L   CO2 23 22 - 32 mmol/L   Glucose, Bld 306 (H) 70 - 99 mg/dL    Comment: Glucose reference range applies only to samples taken after fasting for at least 8 hours.   BUN 8 6 - 20 mg/dL   Creatinine, Ser 1.05 0.61 - 1.24 mg/dL   Calcium 9.1 8.9 - 10.3 mg/dL   Total Protein 7.2 6.5 - 8.1 g/dL   Albumin 4.0 3.5 - 5.0 g/dL   AST 34 15 - 41 U/L   ALT 19 0 - 44 U/L   Alkaline Phosphatase 91 38 - 126 U/L   Total Bilirubin 0.3 0.3 - 1.2 mg/dL   GFR calc non Af Amer >60 >60 mL/min   GFR calc Af Amer >60 >60 mL/min   Anion gap 13 5 - 15    Comment: Performed at [facility], [place] [address] [place], [place] [address]  Ethanol     Status: None   Collection Time: 01/02/20  8:02 PM  Result Value Ref  Range   Alcohol, Ethyl (B) <10 <10 mg/dL    Comment: (NOTE) Lowest detectable limit for serum alcohol is 10 mg/dL. For medical purposes only. Performed at [facility], [place] [address], [place], [place] [address]   Salicylate level     Status: Abnormal   Collection Time: 01/02/20  8:02 PM  Result Value Ref Range   Salicylate Lvl <3.8 (L) 7.0 - 30.0 mg/dL    Comment: Performed at [facility], [place] [address] [place], [place] [address]  Acetaminophen level     Status: Abnormal   Collection Time: 01/02/20  8:02 PM  Result Value Ref Range   Acetaminophen (Tylenol), Serum <10 (L) 10 - 30 ug/mL    Comment: (NOTE) Therapeutic concentrations vary significantly. A range of 10-30 ug/mL  may be an effective concentration for many patients. However, some  are best treated at concentrations outside of this range. Acetaminophen concentrations >150 ug/mL at 4 hours after ingestion  and >50 ug/mL at 12 hours after ingestion are often associated with  toxic reactions. Performed at [facility], [place] [address], [place], [place] [address]   cbc     Status: Abnormal   Collection Time: 01/02/20  8:02 PM  Result Value Ref Range   WBC 7.9 4.0 - 10.5 K/uL   RBC 3.83 (L) 4.22 - 5.81 MIL/uL   Hemoglobin 11.7 (L) 13.0 - 17.0 g/dL   HCT 35.6 (L) 39.0 - 52.0 %   MCV 93.0 80.0 - 100.0 fL   MCH 30.5 26.0 - 34.0 pg   MCHC 32.9 30.0 - 36.0 g/dL   RDW 13.2 11.5 - 15.5 %  Platelets 280 150 - 400 K/uL   nRBC 0.0 0.0 - 0.2 %    Comment: Performed at [facility], [place] [address], [place], [place] [address]  Rapid urine drug screen (hospital performed)     Status: Abnormal   Collection Time: 01/02/20  8:13 PM  Result Value Ref Range   Opiates NONE DETECTED NONE DETECTED   Cocaine NONE DETECTED NONE DETECTED   Benzodiazepines NONE DETECTED NONE DETECTED   Amphetamines NONE DETECTED NONE DETECTED   Tetrahydrocannabinol POSITIVE (A)  NONE DETECTED   Barbiturates NONE DETECTED NONE DETECTED    Comment: (NOTE) DRUG SCREEN FOR MEDICAL PURPOSES ONLY.  IF CONFIRMATION IS NEEDED FOR ANY PURPOSE, NOTIFY LAB WITHIN 5 DAYS. LOWEST DETECTABLE LIMITS FOR URINE DRUG SCREEN Drug Class                     Cutoff (ng/mL) Amphetamine and metabolites    1000 Barbiturate and metabolites    200 Benzodiazepine                 856 Tricyclics and metabolites     300 Opiates and metabolites        300 Cocaine and metabolites        300 THC                            50 Performed at [facility], [place] [address], [place], [place] [address]   Urinalysis, Routine w reflex microscopic     Status: Abnormal   Collection Time: 01/02/20  8:13 PM  Result Value Ref Range   Color, Urine YELLOW YELLOW   APPearance CLEAR CLEAR   Specific Gravity, Urine 1.034 (H) 1.005 - 1.030   pH 6.0 5.0 - 8.0   Glucose, UA 150 (A) NEGATIVE mg/dL   Hgb urine dipstick SMALL (A) NEGATIVE   Bilirubin Urine NEGATIVE NEGATIVE   Ketones, ur NEGATIVE NEGATIVE mg/dL   Protein, ur NEGATIVE NEGATIVE mg/dL   Nitrite NEGATIVE NEGATIVE   Leukocytes,Ua NEGATIVE NEGATIVE   RBC / HPF 11-20 0 - 5 RBC/hpf   WBC, UA 0-5 0 - 5 WBC/hpf   Bacteria, UA NONE SEEN NONE SEEN    Comment: Performed at [facility], [place] [address], [place],  [address]  CBG monitoring, ED     Status: Abnormal   Collection Time: 01/02/20 10:26 PM  Result Value Ref Range   Glucose-Capillary 210 (H) 70 - 99 mg/dL    Comment: Glucose reference range applies only to samples taken after fasting for at least 8 hours.   Comment 1 Notify RN   SARS CORONAVIRUS 2 (TAT 6-24 HRS) Nasopharyngeal Nasopharyngeal Swab     Status: None   Collection Time: 01/03/20  4:50 AM   Specimen: Nasopharyngeal Swab  Result Value Ref Range   SARS Coronavirus 2 NEGATIVE NEGATIVE    Comment: (NOTE) SARS-CoV-2 target nucleic acids are NOT DETECTED. The SARS-CoV-2 RNA is generally  detectable in upper and lower respiratory specimens during the acute phase of infection. Negative results do not preclude SARS-CoV-2 infection, do not rule out co-infections with other pathogens, and should not be used as the sole basis for treatment or other patient management decisions. Negative results must be combined with clinical observations, patient history, and epidemiological information. The expected result is Negative. Fact Sheet for Patients: SugarRoll.be Fact Sheet for Healthcare Providers: https://www.woods-mathews.com/ This test is not yet approved or cleared  by the [place] and  has been authorized for detection and/or diagnosis of SARS-CoV-2 by FDA under an Emergency Use Authorization (EUA). This EUA will remain  in effect (meaning this test can be used) for the duration of the COVID-19 declaration under Section 56 4(b)(1) of the Act, 21 U.S.C. section 360bbb-3(b)(1), unless the authorization is terminated or revoked sooner. Performed at [facility], [place] [address], [place], [place] [address]   CBG monitoring, ED     Status: Abnormal   Collection Time: 01/03/20  7:30 AM  Result Value Ref Range   Glucose-Capillary 493 (H) 70 - 99 mg/dL    Comment: Glucose reference range applies only to samples taken after fasting for at least 8 hours.  CBG monitoring, ED     Status: Abnormal   Collection Time: 01/03/20  9:32 AM  Result Value Ref Range   Glucose-Capillary 461 (H) 70 - 99 mg/dL    Comment: Glucose reference range applies only to samples taken after fasting for at least 8 hours.  CBG monitoring, ED     Status: Abnormal   Collection Time: 01/03/20 11:35 AM  Result Value Ref Range   Glucose-Capillary 330 (H) 70 - 99 mg/dL    Comment: Glucose reference range applies only to samples taken after fasting for at least 8 hours.  CBG monitoring, ED     Status: Abnormal   Collection Time: 01/03/20  4:11 PM  Result  Value Ref Range   Glucose-Capillary 336 (H) 70 - 99 mg/dL    Comment: Glucose reference range applies only to samples taken after fasting for at least 8 hours.  CBG monitoring, ED     Status: Abnormal   Collection Time: 01/04/20  8:02 AM  Result Value Ref Range   Glucose-Capillary 481 (H) 70 - 99 mg/dL    Comment: Glucose reference range applies only to samples taken after fasting for at least 8 hours.    Blood Alcohol level:  Lab Results  Component Value Date   ETH <10 01/02/2020   ETH 12 (H) 05/31/2018    Physical Findings: AIMS:  , ,  ,  ,    CIWA:    COWS:     Musculoskeletal: Strength & Muscle Tone: within normal limits Gait & Station: normal Patient leans: N/A  Psychiatric Specialty Exam: Physical Exam Vitals and nursing note reviewed. Exam conducted with a chaperone present (Sitter at bedside).  Pulmonary:     Effort: Pulmonary effort is normal.  Psychiatric:        Attention and Perception: Attention and perception normal.        Mood and Affect: Affect is labile.        Speech: Speech normal.        Behavior: Behavior is cooperative.        Thought Content: Thought content is not paranoid or delusional. Thought content includes suicidal ideation. Thought content does not include homicidal ideation. Thought content includes suicidal plan.        Cognition and Memory: Cognition and memory normal.        Judgment: Judgment is impulsive.     Review of Systems  Psychiatric/Behavioral: Positive for suicidal ideas. Negative for confusion, decreased concentration and hallucinations. The patient is nervous/anxious.   All other systems reviewed and are negative.   Blood pressure 104/74, pulse 60, temperature 98.1 F (36.7 C), temperature source Oral, resp. rate 16, SpO2 96 %.There is no height or weight on file to calculate BMI.  General Appearance: Casual  Eye Contact:  Good  Speech:  Clear and Coherent and Normal Rate  Volume:  Normal  Mood:  "The same"   Affect:   Congruent  Thought Process:  Coherent, Goal Directed, Linear and Descriptions of Associations: Intact  Orientation:  Full (Time, Place, and Person)  Thought Content:  WDL  Suicidal Thoughts:  Yes.  with intent/plan  Homicidal Thoughts:  No  Memory:  Immediate;   Good Recent;   Good  Judgement:  Fair  Insight:  Shallow  Psychomotor Activity:  Normal  Concentration:  Concentration: Good and Attention Span: Good  Recall:  Good  Fund of Knowledge:  Fair  Language:  Good  Akathisia:  No  Handed:  Right  AIMS (if indicated):     Assets:  Communication Skills Desire for Improvement  ADL's:  Intact  Cognition:  WNL  Sleep:        Treatment Plan Summary: Daily contact with patient to assess and evaluate symptoms and progress in treatment, Medication management and Plan Reassess in 24 hours.  Possible discharge with outpatient resources  Jesus Poplin, NP 01/04/2020, 10:33 AM

## 2020-01-05 DIAGNOSIS — F4321 Adjustment disorder with depressed mood: Secondary | ICD-10-CM

## 2020-01-05 DIAGNOSIS — F314 Bipolar disorder, current episode depressed, severe, without psychotic features: Secondary | ICD-10-CM

## 2020-01-05 DIAGNOSIS — R45851 Suicidal ideations: Secondary | ICD-10-CM

## 2020-01-05 LAB — CBG MONITORING, ED
Glucose-Capillary: 430 mg/dL — ABNORMAL HIGH (ref 70–99)
Glucose-Capillary: 272 mg/dL — ABNORMAL HIGH (ref 70–99)

## 2020-01-05 MED ORDER — INSULIN ASPART PROT & ASPART (70-30 MIX) 100 UNIT/ML ~~LOC~~ SUSP: 32.0000 [IU] | Freq: Two times a day (BID) | SUBCUTANEOUS | 11 refills | Status: DC

## 2020-01-05 MED ORDER — TRAZODONE HCL 50 MG PO TABS: 50.0000 mg | ORAL_TABLET | Freq: Every day | ORAL | 0 refills | Status: DC

## 2020-01-05 MED FILL — ?TRAZODONE HCL 50 TABS: 50 | 30 days supply | Qty: 30 | Fill #0

## 2020-01-05 NOTE — TOC Initial Note (Addendum)
Transition of Care [facility]) - Initial/Assessment Note    Patient Details  Name: Mark [name] MRN: [id number] Date of Birth: Nov 14, 1985  Transition of [name]) CM/SW Contact:    Elliot Cousin, RN Phone Number: [phone] 01/05/2020, 1:44 PM  Clinical Narrative:                 TOC CM spoke to pt at bedside. Provided clothes for patient from donated clothes closet. Pt states he is currently homeless. Provided pt with information on [facility], [facility]. And [facility]. States he has applied for disability but not sure if the application is still active. He states he will speak to sister to try and use her address as he has not received any of his stimulus checks. States he plans to use the money to help with getting housing. Provided pt with 2 bus passes. A bus pass to use to pick up meds from [facility] free of charge and will be able to continue to use [facility] for his medications long term once established with clinic. Provide pt with brochure [facility] with information on appt time. Explained to qualify for disability he will need to be followed by a PCP. Pt states he lost his medication by accidentally leaving it on bus bench while he went to bathroom. Provided pt with a backpack that he can use to secure belongings.     Expected Discharge Plan: Home/Self Care Barriers to Discharge: No Barriers Identified   Patient Goals and CMS Choice        Expected Discharge Plan and Services Expected Discharge Plan: Home/Self Care In-house Referral: Clinical Social Work Discharge Planning Services: CM Consult, Follow-up appt scheduled, Medication Assistance, Indigent Health Clinic   Living arrangements for the past 2 months: Homeless Expected Discharge Date: 01/05/20                                    Prior Living Arrangements/Services Living arrangements for the past 2 months: Homeless Lives with:: Self Patient language and need for interpreter reviewed::  Yes        Need for Family Participation in Patient Care: No (Comment) Care giver support system in place?: No (comment)   Criminal Activity/Legal Involvement Pertinent to Current Situation/Hospitalization: No - Comment as needed  Activities of Daily Living   ADL Screening (condition at time of admission) Patient's cognitive ability adequate to safely complete daily activities?: Yes Is the patient deaf or have difficulty hearing?: Yes(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.) Does the patient have difficulty seeing, even when wearing glasses/contacts?: Yes Does the patient have difficulty concentrating, remembering, or making decisions?: Yes Patient able to express need for assistance with ADLs?: Yes Does the patient have difficulty dressing or bathing?: Yes Independently performs ADLs?: Yes (appropriate for developmental age) Does the patient have difficulty walking or climbing stairs?: Yes(Pt says he gets dizzy quickly going up steps.) Weakness of Legs: Both(Has used a walker in the past.) Weakness of Arms/Hands: None  Permission Sought/Granted Permission sought to share information with : Case Manager, PCP, Family Supports Permission granted to share information with : Yes, Verbal Permission Granted              Emotional Assessment Appearance:: Appears older than stated age Attitude/Demeanor/Rapport: Engaged   Orientation: : Oriented to Self, Oriented to Place, Oriented to Situation, Oriented to  Time Alcohol / Substance Use: Tobacco Use  Psych Involvement: Yes (comment)  Admission diagnosis:  SI Patient Active Problem List   Diagnosis Date Noted  . DKA, type 1 ([place]) 12/30/2019  . DKA (diabetic ketoacidosis) ([place]) 12/29/2019  . Homelessness 12/29/2019  . Marijuana abuse 12/29/2019  . Tobacco dependence 12/29/2019  . Seizure disorder ([place]) 12/29/2019  . Suicidal ideation   . Bipolar I disorder, most recent episode depressed, severe without psychotic features ([place])   .  Adjustment disorder with depressed mood 12/07/2017  . Noncompliance 10/17/2017  . Dysthymia 10/08/2017  . 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 Obstructive hydrocephalus ([place]) 09/19/2017  . Diabetes ([place]) 09/11/2017  . Hypoglycemia 09/09/2017  . MDD (major depressive disorder) 08/22/2017  . Severe recurrent major depression with psychotic features ([place]) 08/19/2017  . Nausea & vomiting 08/18/2017  . Abdominal pain 08/18/2017  . Hyponatremia 08/18/2017  . DKA (diabetic ketoacidoses) ([place]) 11/24/2015  . Diabetic keto-acidosis ([place]) 10/23/2015   PCP:  Patient, No Pcp Per Pharmacy:   [place], [place] [address] [place] [address] Phone: [phone] Fax: [place], [place] [address] [place] [address] Phone: [phone] Fax: [phone]  [id number] [place], [place] [address] [place] [address] Phone: [phone] Fax: [phone]  [facility] [place], [place] - [address] [place] [address] Phone: [phone] Fax: [phone]     Social Determinants of Health (SDOH) Interventions    Readmission Risk Interventions No flowsheet data found.

## 2020-01-05 NOTE — Discharge Instructions (Signed)
For your mental health needs, you are advised to follow up with [facility].  Call them at your earliest opportunity to schedule an intake appointment:       [facility]      [address]      [place], [place] [address]      [phone]      Crisis number: [phone]  For your shelter needs, you are advised to contact [facility] at your earliest opportunity:       [facility]      [phone]  For other supportive services, contact [facility]:       [facility]      [address]      [place], [place] [address]      [phone] 

## 2020-01-05 NOTE — ED Provider Notes (Signed)
  Physical Exam  BP 102/60 (BP Location: Left Arm)   Pulse 72   Temp 97.9 F (36.6 C) (Oral)   Resp 18   SpO2 96%   Physical Exam  ED Course/Procedures     Procedures  MDM  Called for elevated CBG.  Will give insulin per sliding scale.  Had had insulin adjusted yesterday by diabetes coordinator.       Benjiman Core, MD 01/05/20 [phone]

## 2020-01-05 NOTE — ED Notes (Signed)
Per report this patient has been agitated because we will not give him diet ginger-ale throughout the night.  He is periodically hollering "This Hospital Sucks"  He is extremely irritable on assessment.

## 2020-01-05 NOTE — Consult Note (Signed)
[facility] Psych ED Discharge  01/05/2020 1:05 PM Mark [name]  MRN:  [id number] Principal Problem: Adjustment disorder with depressed mood Discharge Diagnoses: Principal Problem:   Adjustment disorder with depressed mood Active Problems:   Bipolar I disorder, most recent episode depressed, severe without psychotic features [facility])   Subjective: Mark [name], 34 y.o., male patient seen via tele psych for psychiatric follow up consult by this provider, consulted with Dr. Mallie Darting; and chart reviewed on 01/05/20.  On evaluation Mark [name] reports he is feeling better "I don't feel like nothing is helping."  Patient states that he is tolerating his medications, eating without difficulty.  Patient states his biggest stressor is homelessness and would feel better if he has somewhere to stay.   During evaluation Mark [name] is alert/oriented x 4; calm/cooperative; and mood is congruent with affect.  He does not appear to be responding to internal/external stimuli or delusional thoughts.  Patient denies suicidal/self-harm/homicidal ideation, psychosis, and paranoia.  Patient answered question appropriately.  Patient malingering for secondary gain (homelessness) patient has been yelling and cursing staff when he doesn't get what he wants.       Total Time spent with patient: 30 minutes  Past Psychiatric History: See above  Past Medical History:  Past Medical History:  Diagnosis Date  . Depression   . Diabetes mellitus without complication ([place])   . Hydrocephalus ([place])   . Kidney stones   . Seizures ([place])     Past Surgical History:  Procedure Laterality Date  . KIDNEY STONE SURGERY     Family History:  Family History  Problem Relation Age of Onset  . Breast cancer Mother    Family Psychiatric  History: Unaware Social History:  Social History   Substance and Sexual Activity  Alcohol Use Never   Comment: occassional      Social History   Substance and Sexual Activity  Drug  Use Yes  . Types: Marijuana   Comment: smoked 2-3 days ago    Social History   Socioeconomic History  . Marital status: Single    Spouse name: Not on file  . Number of children: Not on file  . Years of education: Not on file  . Highest education level: Not on file  Occupational History  . Not on file  Tobacco Use  . Smoking status: Current Every Day Smoker    Packs/day: 0.75    Years: 20.00    Pack years: 15.00    Types: Cigarettes  . Smokeless tobacco: Never Used  Substance and Sexual Activity  . Alcohol use: Never    Comment: occassional   . Drug use: Yes    Types: Marijuana    Comment: smoked 2-3 days ago  . Sexual activity: Not on file  Other Topics Concern  . Not on file  Social History Narrative  . Not on file   Social Determinants of Health   Financial Resource Strain:   . Difficulty of Paying Living Expenses:   Food Insecurity:   . Worried About Charity fundraiser in the Last Year:   . Arboriculturist in the Last Year:   Transportation Needs:   . Film/video editor (Medical):   Marland Kitchen Lack of Transportation (Non-Medical):   Physical Activity:   . Days of Exercise per Week:   . Minutes of Exercise per Session:   Stress:   . Feeling of Stress :   Social Connections:   . Frequency of Communication with Friends  and Family:   . Frequency of Social Gatherings with Friends and Family:   . Attends Religious Services:   . Active Member of Clubs or Organizations:   . Attends Archivist Meetings:   Marland Kitchen Marital Status:     Has this patient used any form of tobacco in the last 30 days? (Cigarettes, Smokeless Tobacco, Cigars, and/or Pipes) A prescription for an FDA-approved tobacco cessation medication was offered at discharge and the patient refused  Current Medications: Current Facility-Administered Medications  Medication Dose Route Frequency Provider Last Rate Last Admin  . acetaminophen (TYLENOL) tablet 1,000 mg  1,000 mg Oral QHS PRN Wyvonnia Dusky, MD       And  . diphenhydrAMINE (BENADRYL) capsule 50 mg  50 mg Oral QHS PRN Wyvonnia Dusky, MD      . divalproex (DEPAKOTE) DR tablet 500 mg  500 mg Oral Q12H Caccavale, Sophia, PA-C   500 mg at 01/05/20 0943  . escitalopram (LEXAPRO) tablet 10 mg  10 mg Oral Daily Caccavale, Sophia, PA-C   10 mg at 01/05/20 0943  . insulin aspart (novoLOG) injection 0-15 Units  0-15 Units Subcutaneous TID WC Pattricia Boss, MD   7 Units at 01/05/20 1154  . insulin aspart (novoLOG) injection 0-5 Units  0-5 Units Subcutaneous QHS Pattricia Boss, MD      . insulin aspart protamine- aspart (NOVOLOG MIX 70/30) injection 32 Units  32 Units Subcutaneous BID WC Pattricia Boss, MD   32 Units at 01/05/20 0758  . levothyroxine (SYNTHROID) tablet 100 mcg  100 mcg Oral QAC breakfast Caccavale, Sophia, PA-C   100 mcg at 01/05/20 0759  . pregabalin (LYRICA) capsule 150 mg  150 mg Oral BID Caccavale, Sophia, PA-C   150 mg at 01/05/20 0943  . QUEtiapine (SEROQUEL) tablet 100 mg  100 mg Oral QHS Caccavale, Sophia, PA-C   100 mg at 01/04/20 2145  . traZODone (DESYREL) tablet 50 mg  50 mg Oral QHS Corena Pilgrim, MD   50 mg at 01/04/20 2145   Current Outpatient Medications  Medication Sig Dispense Refill  . divalproex (DEPAKOTE) 500 MG DR tablet Take 1 tablet (500 mg total) by mouth every 12 (twelve) hours. 60 tablet 0  . escitalopram (LEXAPRO) 10 MG tablet Take 1 tablet (10 mg total) by mouth daily. 30 tablet 0  . insulin aspart (NOVOLOG) 100 UNIT/ML injection Blood Sugar: 60-110= none,111-150= 2 unit, 151-200= 4 unit, 201-250= 6 unit, 251-300= 8 unit, 301-350= 10 unit, >350=   12 units Call MD 10 mL 0  . levothyroxine (SYNTHROID) 100 MCG tablet Take 1 tablet (100 mcg total) by mouth daily before breakfast. 30 tablet 0  . pregabalin (LYRICA) 150 MG capsule Take 1 capsule (150 mg total) by mouth 2 (two) times daily. 60 capsule 0  . QUEtiapine (SEROQUEL) 100 MG tablet Take 1 tablet (100 mg total) by mouth at bedtime.  30 tablet 0  . blood glucose meter kit and supplies KIT Dispense based on patient and insurance preference. Use up to four times daily as directed. (FOR ICD-9 250.00, 250.01). 1 each 0  . insulin aspart protamine- aspart (NOVOLOG MIX 70/30) (70-30) 100 UNIT/ML injection Inject 0.32 mLs (32 Units total) into the skin 2 (two) times daily with a meal. 10 mL 11  . Insulin Pen Needle (PEN NEEDLES 3/16") 31G X 5 MM MISC 1 Dose by Does not apply route 3 (three) times daily. 100 each 0  . traZODone (DESYREL) 50 MG tablet Take 1 tablet (  50 mg total) by mouth at bedtime. 30 tablet 0   PTA Medications: (Not in a hospital admission)   Musculoskeletal: Strength & Muscle Tone: within normal limits Gait & Station: normal Patient leans: N/A  Psychiatric Specialty Exam: Physical Exam Vitals and nursing note reviewed. Exam conducted with a chaperone present (Sitter at bedside).  Pulmonary:     Effort: Pulmonary effort is normal.  Psychiatric:        Attention and Perception: Attention and perception normal.        Mood and Affect: Anxious: Stable. Depressed: Stable. Affect is labile.        Behavior: Behavior normal. Agitated: on and off when he doesn't get what he wants. Behavior is cooperative.        Thought Content: Thought content is not paranoid or delusional. Thought content does not include homicidal or suicidal (Denies at this time) ideation.        Cognition and Memory: Cognition and memory normal.        Judgment: Judgment is impulsive.     Review of Systems  Psychiatric/Behavioral: Positive for agitation. Negative for confusion, self-injury, sleep disturbance and suicidal ideas.       Patient stating that he is homeless and if had somewhere to live he would feel better.  States that he would like shelter information; has not stayed in a shelter since he's been in [place]   All other systems reviewed and are negative.   Blood pressure 105/68, pulse 73, temperature 98.1 F (36.7 C),  temperature source Oral, resp. rate 18, SpO2 97 %.There is no height or weight on file to calculate BMI.  General Appearance: Casual  Eye Contact:  Good  Speech:  Blocked and Normal Rate  Volume:  Normal  Mood:  "Better"   Affect:  Appropriate and Congruent  Thought Process:  Coherent, Goal Directed and Descriptions of Associations: Intact  Orientation:  Full (Time, Place, and Person)  Thought Content:  WDL  Suicidal Thoughts:  No  Homicidal Thoughts:  No  Memory:  Immediate;   Good Recent;   Good  Judgement:  Intact  Insight:  Present  Psychomotor Activity:  Normal  Concentration:  Concentration: Good and Attention Span: Good  Recall:  Good  Fund of Knowledge:  Fair  Language:  Good  Akathisia:  No  Handed:  Right  AIMS (if indicated):     Assets:  Communication Skills Desire for Improvement  ADL's:  Intact  Cognition:  WNL  Sleep:        Demographic Factors:  Male and Caucasian  Loss Factors: Financial problems/change in socioeconomic status  Historical Factors: Impulsivity  Risk Reduction Factors:   Religious beliefs about death  Continued Clinical Symptoms:  Alcohol/Substance Abuse/Dependencies Previous Psychiatric Diagnoses and Treatments  Cognitive Features That Contribute To Risk:  None    Suicide Risk:  Minimal: No identifiable suicidal ideation.  Patients presenting with no risk factors but with morbid ruminations; may be classified as minimal risk based on the severity of the depressive symptoms  Plan Of Care/Follow-up recommendations:  Activity:  As tolerated Diet:  Heart healthy    Discharge Instructions     For your mental health needs, you are advised to follow up with [facility].  Call them at your earliest opportunity to schedule an intake appointment:       [facility]. [address]      [place], [place] [address]      [phone]  Crisis number: [phone]  For your shelter needs, you are advised to contact [facility] at your earliest opportunity:       [facility]      [phone]  For other supportive services, contact the [place]:       [facility]      [place], [place] [address]      [phone]      Disposition:  Psychiatrically cleared No evidence of imminent risk to self or others at present.   Patient does not meet criteria for psychiatric inpatient admission. Supportive therapy provided about ongoing stressors. Discussed crisis plan, support from social network, calling 911, coming to the Emergency Department, and calling Suicide Hotline.   Jackquline Branca, NP 01/05/2020, 1:05 PM

## 2020-01-05 NOTE — BH Assessment (Signed)
[facility] Assessment Progress Note  Per Shuvon Rankin, FNP, this pt does not require psychiatric hospitalization at this time.  Pt is to be discharged from [facility] with referral information for [facility].  This has been included in pt's discharge instructions, along with information regarding area supportive services for the homeless.  Pt's nurse, Kendal Hymen, has been notified.  Doylene Canning, MA Triage Specialist [phone]
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
## 2020-01-18 ENCOUNTER — Ambulatory Visit: Payer: Self-pay | Attending: Nurse Practitioner | Admitting: Nurse Practitioner

## 2020-01-18 ENCOUNTER — Other Ambulatory Visit: Payer: Self-pay

## 2020-02-01 ENCOUNTER — Emergency Department (HOSPITAL_COMMUNITY)
Admission: EM | Admit: 2020-02-01 | Discharge: 2020-02-01 | Disposition: A | Discharge status: Home / Self Care | Payer: Self-pay | Attending: Emergency Medicine | Admitting: Emergency Medicine

## 2020-02-01 ENCOUNTER — Encounter (HOSPITAL_COMMUNITY): Payer: Self-pay | Admitting: Emergency Medicine

## 2020-02-01 DIAGNOSIS — Z5321 Procedure and treatment not carried out due to patient leaving prior to being seen by health care provider: Secondary | ICD-10-CM | POA: Insufficient documentation

## 2020-02-01 DIAGNOSIS — Z794 Long term (current) use of insulin: Secondary | ICD-10-CM | POA: Insufficient documentation

## 2020-02-01 DIAGNOSIS — R197 Diarrhea, unspecified: Secondary | ICD-10-CM | POA: Insufficient documentation

## 2020-02-01 DIAGNOSIS — E1165 Type 2 diabetes mellitus with hyperglycemia: Secondary | ICD-10-CM | POA: Insufficient documentation

## 2020-02-01 LAB — CBG MONITORING, ED: Glucose-Capillary: 315 mg/dL — ABNORMAL HIGH (ref 70–99)

## 2020-02-01 NOTE — ED Triage Notes (Signed)
[facility] pt from home for hyperglycemia. Ran out of insulin today and doesn't have money to get more. Pt having bilat feet feeling weird and diarrhea x 3 days.  CBG 402 Vitals: 138/70, 110HR, 98% on RA>

## 2020-02-02 ENCOUNTER — Encounter (HOSPITAL_COMMUNITY): Payer: Self-pay | Admitting: Emergency Medicine

## 2020-02-02 ENCOUNTER — Inpatient Hospital Stay (HOSPITAL_COMMUNITY)
Admission: EM | Admit: 2020-02-02 | Discharge: 2020-02-05 | DRG: 638 | Disposition: A | Discharge status: Home / Self Care | Payer: Self-pay | Attending: Internal Medicine | Admitting: Internal Medicine

## 2020-02-02 ENCOUNTER — Other Ambulatory Visit: Payer: Self-pay

## 2020-02-02 DIAGNOSIS — E10649 Type 1 diabetes mellitus with hypoglycemia without coma: Principal | ICD-10-CM | POA: Diagnosis present

## 2020-02-02 DIAGNOSIS — E1010 Type 1 diabetes mellitus with ketoacidosis without coma: Secondary | ICD-10-CM | POA: Diagnosis present

## 2020-02-02 DIAGNOSIS — G40909 Epilepsy, unspecified, not intractable, without status epilepticus: Secondary | ICD-10-CM | POA: Diagnosis present

## 2020-02-02 DIAGNOSIS — Z794 Long term (current) use of insulin: Secondary | ICD-10-CM

## 2020-02-02 DIAGNOSIS — Z20822 Contact with and (suspected) exposure to covid-19: Secondary | ICD-10-CM | POA: Diagnosis present

## 2020-02-02 DIAGNOSIS — F17200 Nicotine dependence, unspecified, uncomplicated: Secondary | ICD-10-CM | POA: Diagnosis present

## 2020-02-02 DIAGNOSIS — F17210 Nicotine dependence, cigarettes, uncomplicated: Secondary | ICD-10-CM | POA: Diagnosis present

## 2020-02-02 DIAGNOSIS — E039 Hypothyroidism, unspecified: Secondary | ICD-10-CM

## 2020-02-02 DIAGNOSIS — E1065 Type 1 diabetes mellitus with hyperglycemia: Secondary | ICD-10-CM

## 2020-02-02 DIAGNOSIS — N179 Acute kidney failure, unspecified: Secondary | ICD-10-CM | POA: Diagnosis present

## 2020-02-02 DIAGNOSIS — E875 Hyperkalemia: Secondary | ICD-10-CM | POA: Diagnosis present

## 2020-02-02 DIAGNOSIS — E860 Dehydration: Secondary | ICD-10-CM | POA: Diagnosis present

## 2020-02-02 DIAGNOSIS — E870 Hyperosmolality and hypernatremia: Secondary | ICD-10-CM

## 2020-02-02 DIAGNOSIS — Z79890 Hormone replacement therapy (postmenopausal): Secondary | ICD-10-CM

## 2020-02-02 DIAGNOSIS — Z9119 Patient's noncompliance with other medical treatment and regimen: Secondary | ICD-10-CM

## 2020-02-02 DIAGNOSIS — E162 Hypoglycemia, unspecified: Secondary | ICD-10-CM | POA: Diagnosis present

## 2020-02-02 DIAGNOSIS — F314 Bipolar disorder, current episode depressed, severe, without psychotic features: Secondary | ICD-10-CM | POA: Diagnosis present

## 2020-02-02 DIAGNOSIS — F315 Bipolar disorder, current episode depressed, severe, with psychotic features: Secondary | ICD-10-CM | POA: Diagnosis present

## 2020-02-02 DIAGNOSIS — E1042 Type 1 diabetes mellitus with diabetic polyneuropathy: Secondary | ICD-10-CM | POA: Diagnosis present

## 2020-02-02 DIAGNOSIS — Z79899 Other long term (current) drug therapy: Secondary | ICD-10-CM

## 2020-02-02 HISTORY — DX: Bipolar disorder, unspecified: F31.9

## 2020-02-02 HISTORY — DX: Type 1 diabetes mellitus with diabetic polyneuropathy: E10.42

## 2020-02-02 LAB — I-STAT CHEM 8, ED
BUN: 6 mg/dL (ref 6–20)
Calcium, Ion: 1.14 mmol/L — ABNORMAL LOW (ref 1.15–1.40)
Chloride: 104 mmol/L (ref 98–111)
Creatinine, Ser: 1 mg/dL (ref 0.61–1.24)
Glucose, Bld: 52 mg/dL — ABNORMAL LOW (ref 70–99)
HCT: 44 % (ref 39.0–52.0)
Hemoglobin: 15 g/dL (ref 13.0–17.0)
Potassium: 3.1 mmol/L — ABNORMAL LOW (ref 3.5–5.1)
Sodium: 140 mmol/L (ref 135–145)
TCO2: 25 mmol/L (ref 22–32)

## 2020-02-02 LAB — URINALYSIS, ROUTINE W REFLEX MICROSCOPIC
Bilirubin Urine: NEGATIVE
Glucose, UA: NEGATIVE mg/dL
Hgb urine dipstick: NEGATIVE
Ketones, ur: NEGATIVE mg/dL
Leukocytes,Ua: NEGATIVE
Nitrite: NEGATIVE
Protein, ur: NEGATIVE mg/dL
Specific Gravity, Urine: 1.005 (ref 1.005–1.030)
pH: 6 (ref 5.0–8.0)

## 2020-02-02 LAB — CBC WITH DIFFERENTIAL/PLATELET
Abs Immature Granulocytes: 0.03 10*3/uL (ref 0.00–0.07)
Basophils Absolute: 0.1 10*3/uL (ref 0.0–0.1)
Basophils Relative: 1 %
Eosinophils Absolute: 0.2 10*3/uL (ref 0.0–0.5)
Eosinophils Relative: 2 %
HCT: 42.8 % (ref 39.0–52.0)
Hemoglobin: 13.9 g/dL (ref 13.0–17.0)
Immature Granulocytes: 0 %
Lymphocytes Relative: 27 %
Lymphs Abs: 2.8 10*3/uL (ref 0.7–4.0)
MCH: 29.3 pg (ref 26.0–34.0)
MCHC: 32.5 g/dL (ref 30.0–36.0)
MCV: 90.1 fL (ref 80.0–100.0)
Monocytes Absolute: 1 10*3/uL (ref 0.1–1.0)
Monocytes Relative: 10 %
Neutro Abs: 6.5 10*3/uL (ref 1.7–7.7)
Neutrophils Relative %: 60 %
Platelets: 350 10*3/uL (ref 150–400)
RBC: 4.75 MIL/uL (ref 4.22–5.81)
RDW: 13.1 % (ref 11.5–15.5)
WBC: 10.5 10*3/uL (ref 4.0–10.5)
nRBC: 0 % (ref 0.0–0.2)

## 2020-02-02 LAB — CBG MONITORING, ED
Glucose-Capillary: 39 mg/dL — CL (ref 70–99)
Glucose-Capillary: 45 mg/dL — ABNORMAL LOW (ref 70–99)
Glucose-Capillary: 62 mg/dL — ABNORMAL LOW (ref 70–99)
Glucose-Capillary: 38 mg/dL — CL (ref 70–99)
Glucose-Capillary: 43 mg/dL — CL (ref 70–99)
Glucose-Capillary: 81 mg/dL (ref 70–99)
Glucose-Capillary: 60 mg/dL — ABNORMAL LOW (ref 70–99)
Glucose-Capillary: 82 mg/dL (ref 70–99)
Glucose-Capillary: 114 mg/dL — ABNORMAL HIGH (ref 70–99)
Glucose-Capillary: 44 mg/dL — CL (ref 70–99)
Glucose-Capillary: 105 mg/dL — ABNORMAL HIGH (ref 70–99)
Glucose-Capillary: 119 mg/dL — ABNORMAL HIGH (ref 70–99)
Glucose-Capillary: 151 mg/dL — ABNORMAL HIGH (ref 70–99)
Glucose-Capillary: 191 mg/dL — ABNORMAL HIGH (ref 70–99)
Glucose-Capillary: 289 mg/dL — ABNORMAL HIGH (ref 70–99)

## 2020-02-02 LAB — MRSA PCR SCREENING: MRSA by PCR: NEGATIVE

## 2020-02-02 LAB — GLUCOSE, CAPILLARY
Glucose-Capillary: 420 mg/dL — ABNORMAL HIGH (ref 70–99)
Glucose-Capillary: 141 mg/dL — ABNORMAL HIGH (ref 70–99)

## 2020-02-02 LAB — SARS CORONAVIRUS 2 (TAT 6-24 HRS): SARS Coronavirus 2: NEGATIVE

## 2020-02-02 MED ORDER — DEXTROSE 50 % IV SOLN
INTRAVENOUS | Status: AC
  Administered 2020-02-02: 25 g
  Filled 2020-02-02: qty 50

## 2020-02-02 MED ORDER — ONDANSETRON HCL 4 MG PO TABS: 4.0000 mg | ORAL_TABLET | Freq: Four times a day (QID) | ORAL | Status: DC | PRN

## 2020-02-02 MED ORDER — INSULIN ASPART 100 UNIT/ML ~~LOC~~ SOLN
15.0000 [IU] | Freq: Once | SUBCUTANEOUS | Status: AC
  Administered 2020-02-02: 15 [IU] via SUBCUTANEOUS

## 2020-02-02 MED ORDER — LEVOTHYROXINE SODIUM 100 MCG PO TABS
100.0000 ug | ORAL_TABLET | Freq: Every day | ORAL | Status: DC
  Administered 2020-02-02 – 2020-02-05 (×4): 100 ug via ORAL
  Filled 2020-02-02 (×5): qty 1

## 2020-02-02 MED ORDER — DEXTROSE 5 % IV SOLN: Freq: Once | INTRAVENOUS | Status: AC

## 2020-02-02 MED ORDER — NICOTINE 14 MG/24HR TD PT24
14.0000 mg | MEDICATED_PATCH | Freq: Every day | TRANSDERMAL | Status: DC
  Administered 2020-02-02 – 2020-02-04 (×3): 14 mg via TRANSDERMAL
  Filled 2020-02-02 (×4): qty 1

## 2020-02-02 MED ORDER — ENOXAPARIN SODIUM 40 MG/0.4ML ~~LOC~~ SOLN
40.0000 mg | SUBCUTANEOUS | Status: DC
  Administered 2020-02-02 – 2020-02-05 (×4): 40 mg via SUBCUTANEOUS
  Filled 2020-02-02 (×4): qty 0.4

## 2020-02-02 MED ORDER — ONDANSETRON HCL 4 MG/2ML IJ SOLN: 4.0000 mg | Freq: Four times a day (QID) | INTRAMUSCULAR | Status: DC | PRN

## 2020-02-02 MED ORDER — TRAZODONE HCL 50 MG PO TABS
50.0000 mg | ORAL_TABLET | Freq: Every day | ORAL | Status: DC
  Administered 2020-02-02 – 2020-02-04 (×3): 50 mg via ORAL
  Filled 2020-02-02 (×3): qty 1

## 2020-02-02 MED ORDER — DEXTROSE 50 % IV SOLN
INTRAVENOUS | Status: AC
  Filled 2020-02-02: qty 50

## 2020-02-02 MED ORDER — INSULIN ASPART 100 UNIT/ML ~~LOC~~ SOLN: 0.0000 [IU] | Freq: Three times a day (TID) | SUBCUTANEOUS | Status: DC

## 2020-02-02 MED ORDER — DIVALPROEX SODIUM 250 MG PO DR TAB
500.0000 mg | DELAYED_RELEASE_TABLET | Freq: Two times a day (BID) | ORAL | Status: DC
  Administered 2020-02-02 – 2020-02-05 (×7): 500 mg via ORAL
  Filled 2020-02-02 (×7): qty 2

## 2020-02-02 MED ORDER — ESCITALOPRAM OXALATE 10 MG PO TABS
10.0000 mg | ORAL_TABLET | Freq: Every day | ORAL | Status: DC
  Administered 2020-02-02 – 2020-02-05 (×4): 10 mg via ORAL
  Filled 2020-02-02 (×4): qty 1

## 2020-02-02 MED ORDER — QUETIAPINE FUMARATE 100 MG PO TABS
100.0000 mg | ORAL_TABLET | Freq: Every day | ORAL | Status: DC
  Administered 2020-02-02 – 2020-02-04 (×3): 100 mg via ORAL
  Filled 2020-02-02: qty 4
  Filled 2020-02-02 (×3): qty 1
  Filled 2020-02-02 (×2): qty 4
  Filled 2020-02-02: qty 1

## 2020-02-02 MED ORDER — PREGABALIN 75 MG PO CAPS
150.0000 mg | ORAL_CAPSULE | Freq: Two times a day (BID) | ORAL | Status: DC
  Administered 2020-02-02 – 2020-02-05 (×7): 150 mg via ORAL
  Filled 2020-02-02: qty 1
  Filled 2020-02-02 (×6): qty 2

## 2020-02-02 MED ORDER — DEXTROSE 10 % IV SOLN: INTRAVENOUS | Status: DC

## 2020-02-02 MED ORDER — ACETAMINOPHEN 650 MG RE SUPP: 650.0000 mg | Freq: Four times a day (QID) | RECTAL | Status: DC | PRN

## 2020-02-02 MED ORDER — DEXTROSE 50 % IV SOLN
1.0000 | Freq: Once | INTRAVENOUS | Status: AC
  Administered 2020-02-02: 50 mL via INTRAVENOUS

## 2020-02-02 MED ORDER — ACETAMINOPHEN 325 MG PO TABS: 650.0000 mg | ORAL_TABLET | Freq: Four times a day (QID) | ORAL | Status: DC | PRN

## 2020-02-02 MED ORDER — POLYETHYLENE GLYCOL 3350 17 G PO PACK: 17.0000 g | PACK | Freq: Every day | ORAL | Status: DC | PRN

## 2020-02-02 NOTE — Progress Notes (Signed)
Patient trasfered from ED to 5W26 via stretcher; alert and oriented x 4; no complaints of pain; IV saline locked in LFA and RAC fluids running in right hand; skin intact, red blanchable on buttocks. Orient patient to room and unit;gave patient care guide; instructed how to use the call bell and  fall risk precautions. Will continue to monitor the patient.

## 2020-02-02 NOTE — ED Notes (Signed)
Pt provided with [place] Sandwich and Soda to consume and raise CBG level; this RN will allow this pt to eat and will reassess CBG at a later time.

## 2020-02-02 NOTE — ED Triage Notes (Signed)
BIB EMS from home. Reports bilateral leg pain, hx of neuropathy. Also has no ate all day, CBG 34 with EMS. A/OX4
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
## 2020-02-02 NOTE — ED Notes (Signed)
This RN checked this Pts CBG; after investigating further, this pt had not eaten food or drank soda due to not being hungry; EDP made aware.

## 2020-02-02 NOTE — ED Notes (Signed)
Pt given sandwich bag and orange juice.  

## 2020-02-02 NOTE — ED Notes (Signed)
CBG 39 upon arrival to ED. Given juice and food.  Pt also reports the only thing he has drank today is alcohol - 3 40's

## 2020-02-02 NOTE — ED Notes (Signed)
Lunch Tray Ordered @ 1026. 

## 2020-02-02 NOTE — Progress Notes (Addendum)
CBG 420 when patient arrived on the floor. MD was informed and patient will receive 15 units Novolog per MD order and IV dextrose was D/C. Will continue to monitor.

## 2020-02-02 NOTE — ED Provider Notes (Signed)
[facility] EMERGENCY DEPARTMENT Provider Note   CSN: [id number] Arrival date & time: 02/02/20  0109     History Chief Complaint  Patient presents with  . Leg Pain    Mark [name] is a 34 y.o. male.  The history is provided by the patient.  Hypoglycemia Initial blood sugar:  39 Blood sugar after intervention:  62 Severity:  Severe Onset quality:  Gradual Timing:  Constant Progression:  Worsening Chronicity:  Recurrent Diabetic status:  Controlled with insulin Current diabetic therapy:  See epic Time since last antidiabetic medication:  13 hours Context: decreased oral intake   Relieved by:  Nothing Ineffective treatments:  None tried Associated symptoms: no altered mental status, no anxiety, no decreased responsiveness, no dizziness, no obesity, no seizures, no shortness of breath, no speech difficulty, no sweats, no tremors, no visual change, no vomiting and no weakness   Risk factors: no kidney disease   Here for chronic BLE pain and noted to be hypoglycemic secondary to decreased oral intake as he is homeless.      Past Medical History:  Diagnosis Date  . Depression   . Diabetes mellitus without complication ([place])   . Hydrocephalus ([place])   . Kidney stones   . Seizures [facility])     Patient Active Problem List   Diagnosis Date Noted  . DKA, type 1 ([place]) 12/30/2019  . DKA (diabetic ketoacidosis) ([place]) 12/29/2019  . Homelessness 12/29/2019  . Marijuana abuse 12/29/2019  . Tobacco dependence 12/29/2019  . Seizure disorder ([place]) 12/29/2019  . Suicidal ideation   . Bipolar I disorder, most recent episode depressed, severe without psychotic features ([place])   . Adjustment disorder with depressed mood 12/07/2017  . Noncompliance 10/17/2017  . Dysthymia 10/08/2017  . Personality disorder in adult [facility]) 09/26/2017  . 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 Obstructive hydrocephalus ([place]) 09/19/2017  . Diabetes ([place]) 09/11/2017  . Hypoglycemia 09/09/2017   . MDD (major depressive disorder) 08/22/2017  . Severe recurrent major depression with psychotic features ([place]) 08/19/2017  . Nausea & vomiting 08/18/2017  . Abdominal pain 08/18/2017  . Hyponatremia 08/18/2017  . DKA (diabetic ketoacidoses) ([place]) 11/24/2015  . Diabetic keto-acidosis ([place]) 10/23/2015    Past Surgical History:  Procedure Laterality Date  . KIDNEY STONE SURGERY         Family History  Problem Relation Age of Onset  . Breast cancer Mother     Social History   Tobacco Use  . Smoking status: Current Every Day Smoker    Packs/day: 0.75    Years: 20.00    Pack years: 15.00    Types: Cigarettes  . Smokeless tobacco: Never Used  Substance Use Topics  . Alcohol use: Never    Comment: occassional   . Drug use: Yes    Types: Marijuana    Comment: smoked 2-3 days ago    Home Medications Prior to Admission medications   Medication Sig Start Date End Date Taking? Authorizing Provider  blood glucose meter kit and supplies KIT Dispense based on patient and insurance preference. Use up to four times daily as directed. (FOR ICD-9 250.00, 250.01). 12/31/19   Amin, Jeanella Flattery, MD  divalproex (DEPAKOTE) 500 MG DR tablet Take 1 tablet (500 mg total) by mouth every 12 (twelve) hours. 12/31/19   Amin, Jeanella Flattery, MD  escitalopram (LEXAPRO) 10 MG tablet Take 1 tablet (10 mg total) by mouth daily. 12/31/19   Amin, Jeanella Flattery, MD  insulin aspart (NOVOLOG) 100 UNIT/ML injection Blood  Sugar: 60-110= none,111-150= 2 unit, 151-200= 4 unit, 201-250= 6 unit, 251-300= 8 unit, 301-350= 10 unit, >350=   12 units Call MD 12/31/19   Damita Lack, MD  insulin aspart protamine- aspart (NOVOLOG MIX 70/30) (70-30) 100 UNIT/ML injection Inject 0.32 mLs (32 Units total) into the skin 2 (two) times daily with a meal. 01/05/20   Davonna Belling, MD  Insulin Pen Needle (PEN NEEDLES 3/16") 31G X 5 MM MISC 1 Dose by Does not apply route 3 (three) times daily. 12/31/19   Amin, Jeanella Flattery, MD   levothyroxine (SYNTHROID) 100 MCG tablet Take 1 tablet (100 mcg total) by mouth daily before breakfast. 12/31/19   Amin, Jeanella Flattery, MD  pregabalin (LYRICA) 150 MG capsule Take 1 capsule (150 mg total) by mouth 2 (two) times daily. 12/31/19   Amin, Jeanella Flattery, MD  QUEtiapine (SEROQUEL) 100 MG tablet Take 1 tablet (100 mg total) by mouth at bedtime. 12/31/19   Amin, Jeanella Flattery, MD  traZODone (DESYREL) 50 MG tablet Take 1 tablet (50 mg total) by mouth at bedtime. 01/05/20   Davonna Belling, MD    Allergies    Mushroom extract complex, Penicillins, Sulfa antibiotics, and Clindamycin/lincomycin  Review of Systems   Review of Systems  Constitutional: Negative for decreased responsiveness and diaphoresis.  HENT: Negative for congestion.   Eyes: Negative for visual disturbance.  Respiratory: Negative for shortness of breath.   Cardiovascular: Negative for chest pain.  Gastrointestinal: Negative for abdominal pain and vomiting.  Genitourinary: Negative for difficulty urinating.  Musculoskeletal: Negative for gait problem.  Skin: Negative for rash.  Neurological: Negative for dizziness, tremors, seizures, speech difficulty and weakness.  Psychiatric/Behavioral: The patient is not nervous/anxious.   All other systems reviewed and are negative.   Physical Exam Updated Vital Signs BP 133/81 (BP Location: Left Arm)   Pulse 97   Temp 97.9 F (36.6 C) (Oral)   Resp 20   SpO2 100%   Physical Exam Vitals and nursing note reviewed.  Constitutional:      General: He is not in acute distress.    Appearance: Normal appearance.  HENT:     Head: Normocephalic and atraumatic.     Nose: Nose normal.  Eyes:     Conjunctiva/sclera: Conjunctivae normal.     Pupils: Pupils are equal, round, and reactive to light.  Cardiovascular:     Rate and Rhythm: Normal rate and regular rhythm.     Pulses: Normal pulses.     Heart sounds: Normal heart sounds.  Pulmonary:     Effort: Pulmonary effort is  normal.     Breath sounds: Normal breath sounds.  Abdominal:     General: Abdomen is flat. Bowel sounds are normal.     Tenderness: There is no abdominal tenderness. There is no guarding.  Musculoskeletal:        General: Normal range of motion.     Cervical back: Normal range of motion and neck supple.  Skin:    General: Skin is warm and dry.     Capillary Refill: Capillary refill takes less than 2 seconds.  Neurological:     General: No focal deficit present.     Mental Status: He is alert and oriented to person, place, and time.     Deep Tendon Reflexes: Reflexes normal.  Psychiatric:        Mood and Affect: Mood normal.        Behavior: Behavior normal.     ED Results / Procedures /  Treatments   Labs (all labs ordered are listed, but only abnormal results are displayed) Results for orders placed or performed during the hospital encounter of 02/02/20  CBC with Differential/Platelet  Result Value Ref Range   WBC 10.5 4.0 - 10.5 K/uL   RBC 4.75 4.22 - 5.81 MIL/uL   Hemoglobin 13.9 13.0 - 17.0 g/dL   HCT 42.8 39.0 - 52.0 %   MCV 90.1 80.0 - 100.0 fL   MCH 29.3 26.0 - 34.0 pg   MCHC 32.5 30.0 - 36.0 g/dL   RDW 13.1 11.5 - 15.5 %   Platelets 350 150 - 400 K/uL   nRBC 0.0 0.0 - 0.2 %   Neutrophils Relative % 60 %   Neutro Abs 6.5 1.7 - 7.7 K/uL   Lymphocytes Relative 27 %   Lymphs Abs 2.8 0.7 - 4.0 K/uL   Monocytes Relative 10 %   Monocytes Absolute 1.0 0.1 - 1.0 K/uL   Eosinophils Relative 2 %   Eosinophils Absolute 0.2 0.0 - 0.5 K/uL   Basophils Relative 1 %   Basophils Absolute 0.1 0.0 - 0.1 K/uL   Immature Granulocytes 0 %   Abs Immature Granulocytes 0.03 0.00 - 0.07 K/uL  Urinalysis, Routine w reflex microscopic  Result Value Ref Range   Color, Urine STRAW (A) YELLOW   APPearance CLEAR CLEAR   Specific Gravity, Urine 1.005 1.005 - 1.030   pH 6.0 5.0 - 8.0   Glucose, UA NEGATIVE NEGATIVE mg/dL   Hgb urine dipstick NEGATIVE NEGATIVE   Bilirubin Urine NEGATIVE  NEGATIVE   Ketones, ur NEGATIVE NEGATIVE mg/dL   Protein, ur NEGATIVE NEGATIVE mg/dL   Nitrite NEGATIVE NEGATIVE   Leukocytes,Ua NEGATIVE NEGATIVE  CBG monitoring, ED  Result Value Ref Range   Glucose-Capillary 39 (LL) 70 - 99 mg/dL  CBG monitoring, ED  Result Value Ref Range   Glucose-Capillary 45 (L) 70 - 99 mg/dL  I-stat chem 8, ED (not at [facility] or [facility])  Result Value Ref Range   Sodium 140 135 - 145 mmol/L   Potassium 3.1 (L) 3.5 - 5.1 mmol/L   Chloride 104 98 - 111 mmol/L   BUN 6 6 - 20 mg/dL   Creatinine, Ser 1.00 0.61 - 1.24 mg/dL   Glucose, Bld 52 (L) 70 - 99 mg/dL   Calcium, Ion 1.14 (L) 1.15 - 1.40 mmol/L   TCO2 25 22 - 32 mmol/L   Hemoglobin 15.0 13.0 - 17.0 g/dL   HCT 44.0 39.0 - 52.0 %  CBG monitoring, ED  Result Value Ref Range   Glucose-Capillary 62 (L) 70 - 99 mg/dL  POC CBG, ED  Result Value Ref Range   Glucose-Capillary 38 (LL) 70 - 99 mg/dL   Comment 1 Notify RN   POC CBG, ED  Result Value Ref Range   Glucose-Capillary 43 (LL) 70 - 99 mg/dL   Comment 1 Notify RN    Comment 2 Call MD NNP PA CNM    Comment 3 Document in Chart    No results found. EKG None  Radiology No results found.  Procedures Procedures (including critical care time)  Medications Ordered in ED Medications - No data to display  ED Course  I have reviewed the triage vital signs and the nursing notes.  Pertinent labs & imaging results that were available during my care of the patient were reviewed by me and considered in my medical decision making (see chart for details).    Awake and alert and taking PO orally.  Was given food and orange juice in triage without relief.  Fed here without improvement  MDM Reviewed: previous chart, nursing note and vitals Interpretation: labs (hypoglycemia persistent ) Total time providing critical care: 75-105 minutes (d10 drip initiated by me ). This excludes time spent performing separately reportable procedures and services. Consults:  admitting MD  CRITICAL CARE Performed by: Reizel Calzada K Bradin Mcadory-Rasch Total critical care time: 75 minutes Critical care time was exclusive of separately billable procedures and treating other patients. Critical care was necessary to treat or prevent imminent or life-threatening deterioration. Critical care was time spent personally by me on the following activities: development of treatment plan with patient and/or surrogate as well as nursing, discussions with consultants, evaluation of patient's response to treatment, examination of patient, obtaining history from patient or surrogate, ordering and performing treatments and interventions, ordering and review of laboratory studies, ordering and review of radiographic studies, pulse oximetry and re-evaluation of patient's condition.   Mark [name] was evaluated in Emergency Department on 02/02/2020 for the symptoms described in the history of present illness. He was evaluated in the context of the global COVID-19 pandemic, which necessitated consideration that the patient might be at risk for infection with the SARS-CoV-2 virus that causes COVID-19. Institutional protocols and algorithms that pertain to the evaluation of patients at risk for COVID-19 are in a state of rapid change based on information released by regulatory bodies including the CDC and federal and state organizations. These policies and algorithms were followed during the patient's care in the ED.  Final Clinical Impression(s) / ED Diagnoses   Admit for persistent hypoglycemia on D10 drip       Kasch Borquez, MD 02/02/20 5001

## 2020-02-02 NOTE — Progress Notes (Signed)
Patient seen and examined and agree with excellent documentation and plan of care according to my partner Dr. Greggory Stallion  He is persistently hypoglycemic-it is unclear whether he understands how to take meds versus not Note also in the past suicidal ideation on multiple meds SSRIs gabapentin and he completely denies this was an attempt at suicide tells me he lives with a friend of his who is an alcoholic He states that he smokes occasionally does not drink    Given an amp of D50 this morning Med review shows he is on 7030 32 U as well as sliding scale specialized regimen at home-it is unclear what he was taking  Since I saw him this morning his blood sugars have come up after being given D50 numerous times as well as a diet  Observe overnight consult diabetic coordinator and ensure safe plan prior to discharge likely 24 to 48 hours

## 2020-02-02 NOTE — Progress Notes (Signed)
Inpatient Diabetes Program Recommendations  AACE/ADA: New Consensus Statement on Inpatient Glycemic Control (2015)  Target Ranges:  Prepandial:   less than 140 mg/dL      Peak postprandial:   less than 180 mg/dL (1-2 hours)      Critically ill patients:  140 - 180 mg/dL   Lab Results  Component Value Date   GLUCAP 114 (H) 02/02/2020   HGBA1C 8.4 (H) 12/29/2019    Review of Glycemic Control  Diabetes history: DM type 1 Outpatient Diabetes medications: 70/30 32 units bid Current orders for Inpatient glycemic control: Novolog 0-6 units tid.  Inpatient Diabetes Program Recommendations:    When glucose trends increase add basal insulin as pt has type 1 DM.  Noted pt states without insulin waiting in ED for refill. Note pt has been in the ED 4 times since march this year. Pt has been set up with [facility] and will be able to get insulin from there. Unsure of status of pt with the clinic. Pt should be able to get insulin $10 at the most.  Pt has been seen in the past by multiple DM coordinators. Not compliant with glucose checks at home. A1c 8.4% is not as high as in the past where he has been around 9%.  Thanks,  Christena Deem RN, MSN, BC-ADM Inpatient Diabetes Coordinator Team Pager [phone] (8a-5p)

## 2020-02-03 DIAGNOSIS — E162 Hypoglycemia, unspecified: Secondary | ICD-10-CM | POA: Diagnosis present

## 2020-02-03 LAB — BASIC METABOLIC PANEL WITH GFR
Anion gap: 14 (ref 5–15)
BUN: 18 mg/dL (ref 6–20)
CO2: 22 mmol/L (ref 22–32)
Calcium: 8.5 mg/dL — ABNORMAL LOW (ref 8.9–10.3)
Chloride: 103 mmol/L (ref 98–111)
Creatinine, Ser: 1.49 mg/dL — ABNORMAL HIGH (ref 0.61–1.24)
GFR calc Af Amer: >60 mL/min
GFR calc non Af Amer: 60 mL/min — ABNORMAL LOW
Glucose, Bld: 433 mg/dL — ABNORMAL HIGH (ref 70–99)
Potassium: 4.2 mmol/L (ref 3.5–5.1)
Sodium: 139 mmol/L (ref 135–145)

## 2020-02-03 LAB — GLUCOSE, CAPILLARY
Glucose-Capillary: >600 mg/dL (ref 70–99)
Glucose-Capillary: 346 mg/dL — ABNORMAL HIGH (ref 70–99)
Glucose-Capillary: 431 mg/dL — ABNORMAL HIGH (ref 70–99)
Glucose-Capillary: 136 mg/dL — ABNORMAL HIGH (ref 70–99)
Glucose-Capillary: 82 mg/dL (ref 70–99)
Glucose-Capillary: 128 mg/dL — ABNORMAL HIGH (ref 70–99)
Glucose-Capillary: 183 mg/dL — ABNORMAL HIGH (ref 70–99)
Glucose-Capillary: 207 mg/dL — ABNORMAL HIGH (ref 70–99)
Glucose-Capillary: 170 mg/dL — ABNORMAL HIGH (ref 70–99)
Glucose-Capillary: 38 mg/dL — CL (ref 70–99)
Glucose-Capillary: 73 mg/dL (ref 70–99)
Glucose-Capillary: 57 mg/dL — ABNORMAL LOW (ref 70–99)
Glucose-Capillary: 66 mg/dL — ABNORMAL LOW (ref 70–99)
Glucose-Capillary: 117 mg/dL — ABNORMAL HIGH (ref 70–99)
Glucose-Capillary: 147 mg/dL — ABNORMAL HIGH (ref 70–99)
Glucose-Capillary: 258 mg/dL — ABNORMAL HIGH (ref 70–99)

## 2020-02-03 LAB — COMPREHENSIVE METABOLIC PANEL
ALT: 15 U/L (ref 0–44)
AST: 19 U/L (ref 15–41)
Albumin: 3.6 g/dL (ref 3.5–5.0)
Alkaline Phosphatase: 97 U/L (ref 38–126)
Anion gap: 12 (ref 5–15)
BUN: 17 mg/dL (ref 6–20)
CO2: 21 mmol/L — ABNORMAL LOW (ref 22–32)
Calcium: 8.6 mg/dL — ABNORMAL LOW (ref 8.9–10.3)
Chloride: 97 mmol/L — ABNORMAL LOW (ref 98–111)
Creatinine, Ser: 1.28 mg/dL — ABNORMAL HIGH (ref 0.61–1.24)
GFR calc Af Amer: >60 mL/min (ref >60)
GFR calc non Af Amer: >60 mL/min (ref >60)
Glucose, Bld: 612 mg/dL (ref 70–99)
Potassium: 6.9 mmol/L (ref 3.5–5.1)
Sodium: 130 mmol/L — ABNORMAL LOW (ref 135–145)
Total Bilirubin: 1 mg/dL (ref 0.3–1.2)
Total Protein: 6.9 g/dL (ref 6.5–8.1)

## 2020-02-03 LAB — CBC WITH DIFFERENTIAL/PLATELET
Abs Immature Granulocytes: 0.03 10*3/uL (ref 0.00–0.07)
Basophils Absolute: 0.1 10*3/uL (ref 0.0–0.1)
Basophils Relative: 1 %
Eosinophils Absolute: 0.3 10*3/uL (ref 0.0–0.5)
Eosinophils Relative: 4 %
HCT: 42.4 % (ref 39.0–52.0)
Hemoglobin: 13.8 g/dL (ref 13.0–17.0)
Immature Granulocytes: 0 %
Lymphocytes Relative: 35 %
Lymphs Abs: 2.9 10*3/uL (ref 0.7–4.0)
MCH: 29 pg (ref 26.0–34.0)
MCHC: 32.5 g/dL (ref 30.0–36.0)
MCV: 89.1 fL (ref 80.0–100.0)
Monocytes Absolute: 0.6 10*3/uL (ref 0.1–1.0)
Monocytes Relative: 7 %
Neutro Abs: 4.5 10*3/uL (ref 1.7–7.7)
Neutrophils Relative %: 53 %
Platelets: 273 10*3/uL (ref 150–400)
RBC: 4.76 MIL/uL (ref 4.22–5.81)
RDW: 13 % (ref 11.5–15.5)
WBC: 8.4 10*3/uL (ref 4.0–10.5)
nRBC: 0 % (ref 0.0–0.2)

## 2020-02-03 LAB — OSMOLALITY: Osmolality: 314 mOsm/kg — ABNORMAL HIGH (ref 275–295)

## 2020-02-03 LAB — MAGNESIUM: Magnesium: 1.7 mg/dL (ref 1.7–2.4)

## 2020-02-03 MED ORDER — INSULIN ASPART 100 UNIT/ML ~~LOC~~ SOLN
0.0000 [IU] | Freq: Three times a day (TID) | SUBCUTANEOUS | Status: DC
  Administered 2020-02-03: 2 [IU] via SUBCUTANEOUS
  Administered 2020-02-03: 3 [IU] via SUBCUTANEOUS

## 2020-02-03 MED ORDER — INSULIN ASPART PROT & ASPART (70-30 MIX) 100 UNIT/ML ~~LOC~~ SUSP
30.0000 [IU] | Freq: Two times a day (BID) | SUBCUTANEOUS | Status: DC
  Administered 2020-02-03: 30 [IU] via SUBCUTANEOUS
  Filled 2020-02-03: qty 10

## 2020-02-03 MED ORDER — INSULIN REGULAR(HUMAN) IN NACL 100-0.9 UT/100ML-% IV SOLN
INTRAVENOUS | Status: DC
  Administered 2020-02-03: 13 [IU]/h via INTRAVENOUS
  Filled 2020-02-03: qty 100

## 2020-02-03 MED ORDER — LACTATED RINGERS IV SOLN: INTRAVENOUS | Status: AC

## 2020-02-03 MED ORDER — SODIUM ZIRCONIUM CYCLOSILICATE 10 G PO PACK
10.0000 g | PACK | Freq: Once | ORAL | Status: AC
  Administered 2020-02-03: 10 g via ORAL
  Filled 2020-02-03: qty 1

## 2020-02-03 MED ORDER — SODIUM CHLORIDE 0.9 % IV BOLUS
1000.0000 mL | Freq: Once | INTRAVENOUS | Status: AC
  Administered 2020-02-03: 1000 mL via INTRAVENOUS

## 2020-02-03 MED ORDER — DEXTROSE-NACL 5-0.45 % IV SOLN: INTRAVENOUS | Status: DC

## 2020-02-03 MED ORDER — INSULIN ASPART 100 UNIT/ML ~~LOC~~ SOLN
10.0000 [IU] | Freq: Once | SUBCUTANEOUS | Status: AC
  Administered 2020-02-03: 10 [IU] via SUBCUTANEOUS

## 2020-02-03 MED ORDER — SODIUM CHLORIDE 0.9 % IV SOLN: INTRAVENOUS | Status: DC

## 2020-02-03 MED ORDER — SODIUM BICARBONATE 8.4 % IV SOLN
50.0000 meq | Freq: Once | INTRAVENOUS | Status: AC
  Administered 2020-02-03: 50 meq via INTRAVENOUS
  Filled 2020-02-03: qty 50

## 2020-02-03 MED ORDER — INSULIN ASPART 100 UNIT/ML ~~LOC~~ SOLN: 0.0000 [IU] | Freq: Three times a day (TID) | SUBCUTANEOUS | Status: DC

## 2020-02-03 MED ORDER — DEXTROSE 50 % IV SOLN: 0.0000 mL | INTRAVENOUS | Status: DC | PRN

## 2020-02-03 MED ORDER — INSULIN ASPART PROT & ASPART (70-30 MIX) 100 UNIT/ML ~~LOC~~ SUSP
20.0000 [IU] | Freq: Two times a day (BID) | SUBCUTANEOUS | Status: DC
  Filled 2020-02-03: qty 10

## 2020-02-03 NOTE — Progress Notes (Signed)
PROGRESS NOTE                                                                                                                                                                                                             Patient Demographics:    Mark [name], is a 34 y.o. male, DOB - 23-Apr-1986, HWE:[id number]  Admit date - 02/02/2020   Admitting Physician Marinda Elk, MD  Outpatient Primary MD for the patient is Patient, No Pcp Per  LOS - 0  Chief Complaint  Patient presents with  . Leg Pain       Brief Narrative  34 year old male with past medical history of diabetes mellitus type 1, bipolar disorder, better polyneuropathy, seizure disorder who presents to [facility] emergency department via EMS after the patient reports that his roommate "found me at home."  Work-up in the ER revealed that he was in DKA and was admitted in the hospital.   Subjective:    Mark [name] today has, No headache, No chest pain, No abdominal pain - No Nausea, No new weakness tingling or numbness, No Cough - SOB.     Assessment  & Plan :     1.  DKA in a patient with DM type I.  Question compliance, poor outpatient control due to hyperglycemia with A1c around 8.5, was treated with IV insulin drip and IV fluids, gap seems to have resolved, will switch off his insulin drip, transition to subcu home dose 70/30 insulin along with sliding scale, still dehydrated and will continue IV fluids.  Will request diabetic and insulin educator to educate the patient.  Monitor electrolytes closely.  2.  Bipolar 1 disorder with psychotic features.  Home medications continued.  Denies any recent stressors, suicidal homicidal.  No acute issues, outpatient PCP and psych follow-up discharge.  3.  Smoking.  Counseled to quit.  4.  Diabetic neuropathy.  Supportive care.  5.  Hypothyroidism.  Home dose Synthroid continued.  Recent TSH was 6.4.  ECP to monitor and adjust.  6. HX of medical  noncompliance.  Counseled.    Family Communication  :  none  Code Status :  Full  Disposition Plan  : Stay in the hospital for treatment of DKA and dehydration  Consults  : Diabetic educator  Procedures  : None  DVT Prophylaxis  :  Lovenox    Lab Results  Component Value Date   PLT 273 02/03/2020    Diet :  Diet Order  Diet Carb Modified Fluid consistency: Thin; Room service appropriate? Yes  Diet effective now               Inpatient Medications Scheduled Meds: . divalproex  500 mg Oral Q12H  . enoxaparin (LOVENOX) injection  40 mg Subcutaneous Q24H  . escitalopram  10 mg Oral Daily  . insulin aspart  0-15 Units Subcutaneous TID WC  . insulin aspart protamine- aspart  30 Units Subcutaneous BID WC  . levothyroxine  100 mcg Oral QAC breakfast  . nicotine  14 mg Transdermal Daily  . pregabalin  150 mg Oral BID  . QUEtiapine  100 mg Oral QHS  . traZODone  50 mg Oral QHS   Continuous Infusions: . lactated ringers 125 mL/hr at 02/03/20 0839   PRN Meds:.dextrose, [DISCONTINUED] ondansetron **OR** ondansetron (ZOFRAN) IV, polyethylene glycol  Antibiotics  :   Anti-infectives (From admission, onward)   None          Objective:   Vitals:   02/03/20 0308 02/03/20 0400 02/03/20 0800 02/03/20 0835  BP: (!) 104/58 111/77 105/75   Pulse: 94 94 (!) 119 (!) 109  Resp: 17 16 16 15   Temp: 98.4 F (36.9 C)  97.8 F (36.6 C)   TempSrc: Oral  Oral   SpO2: 95% 94% 94% 95%  Weight:      Height:        SpO2: 95 %  Wt Readings from Last 3 Encounters:  02/02/20 106.7 kg  01/01/20 113.4 kg  12/29/19 113.4 kg     Intake/Output Summary (Last 24 hours) at 02/03/2020 1212 Last data filed at 02/03/2020 1000 Gross per 24 hour  Intake 2735 ml  Output 1775 ml  Net 960 ml     Physical Exam  Awake Alert, No new F.N deficits, Normal affect [place].AT,PERRAL Supple Neck,No JVD, No cervical lymphadenopathy appriciated.  Symmetrical Chest wall movement, Good  air movement bilaterally, CTAB RRR,No Gallops,Rubs or new Murmurs, No Parasternal Heave +ve B.Sounds, Abd Soft, No tenderness, No organomegaly appriciated, No rebound - guarding or rigidity. No Cyanosis, Clubbing or edema, No new Rash or bruise       Data Review:    Recent Labs  Lab 02/02/20 0206 02/02/20 0216 02/03/20 0346  WBC 10.5  --  8.4  HGB 13.9 15.0 13.8  HCT 42.8 44.0 42.4  PLT 350  --  273  MCV 90.1  --  89.1  MCH 29.3  --  29.0  MCHC 32.5  --  32.5  RDW 13.1  --  13.0  LYMPHSABS 2.8  --  2.9  MONOABS 1.0  --  0.6  EOSABS 0.2  --  0.3  BASOSABS 0.1  --  0.1    Recent Labs  Lab 02/02/20 0216 02/03/20 0346 02/03/20 0611  NA 140 130* 139  K 3.1* 6.9* 4.2  CL 104 97* 103  CO2  --  21* 22  GLUCOSE 52* 612* 433*  BUN 6 17 18   CREATININE 1.00 1.28* 1.49*  CALCIUM  --  8.6* 8.5*  AST  --  19  --   ALT  --  15  --   ALKPHOS  --  97  --   BILITOT  --  1.0  --   ALBUMIN  --  3.6  --   MG  --  1.7  --     Recent Labs  Lab 02/02/20 0500  SARSCOV2NAA NEGATIVE    ------------------------------------------------------------------------------------------------------------------ No results for input(s): CHOL, HDL, LDLCALC, TRIG, CHOLHDL,  LDLDIRECT in the last 72 hours.  Lab Results  Component Value Date   HGBA1C 8.4 (H) 12/29/2019   ------------------------------------------------------------------------------------------------------------------ No results for input(s): TSH, T4TOTAL, T3FREE, THYROIDAB in the last 72 hours.  Invalid input(s): FREET3 ------------------------------------------------------------------------------------------------------------------ No results for input(s): VITAMINB12, FOLATE, FERRITIN, TIBC, IRON, RETICCTPCT in the last 72 hours.  Coagulation profile No results for input(s): INR, PROTIME in the last 168 hours.  No results for input(s): DDIMER in the last 72 hours.  Cardiac Enzymes No results for input(s): CKMB, TROPONINI,  MYOGLOBIN in the last 168 hours.  Invalid input(s): CK ------------------------------------------------------------------------------------------------------------------ No results found for: BNP  Micro Results Recent Results (from the past 240 hour(s))  SARS CORONAVIRUS 2 (TAT 6-24 HRS) Nasopharyngeal Nasopharyngeal Swab     Status: None   Collection Time: 02/02/20  5:00 AM   Specimen: Nasopharyngeal Swab  Result Value Ref Range Status   SARS Coronavirus 2 NEGATIVE NEGATIVE Final    Comment: (NOTE) SARS-CoV-2 target nucleic acids are NOT DETECTED. The SARS-CoV-2 RNA is generally detectable in upper and lower respiratory specimens during the acute phase of infection. Negative results do not preclude SARS-CoV-2 infection, do not rule out co-infections with other pathogens, and should not be used as the sole basis for treatment or other patient management decisions. Negative results must be combined with clinical observations, patient history, and epidemiological information. The expected result is Negative. Fact Sheet for Patients: SugarRoll.be Fact Sheet for Healthcare Providers: https://www.woods-mathews.com/ This test is not yet approved or cleared by the [place] FDA and  has been authorized for detection and/or diagnosis of SARS-CoV-2 by FDA under an Emergency Use Authorization (EUA). This EUA will remain  in effect (meaning this test can be used) for the duration of the COVID-19 declaration under Section 56 4(b)(1) of the Act, 21 U.S.C. section 360bbb-3(b)(1), unless the authorization is terminated or revoked sooner. Performed at [facility], [place] [address], [place], [place] [address]   MRSA PCR Screening     Status: None   Collection Time: 02/02/20  7:54 PM   Specimen: Nasal Mucosa; Nasopharyngeal  Result Value Ref Range Status   MRSA by PCR NEGATIVE NEGATIVE Final    Comment:        The GeneXpert MRSA Assay  (FDA approved for NASAL specimens only), is one component of a comprehensive MRSA colonization surveillance program. It is not intended to diagnose MRSA infection nor to guide or monitor treatment for MRSA infections. Performed at [facility], [place] [address] [place], [place] [address]     Radiology Reports No results found.  Time Spent in minutes  30   Lala Lund M.D on 02/03/2020 at 12:12 PM  To page go to [email or web address] - password [facility]

## 2020-02-03 NOTE — Progress Notes (Signed)
CRITICAL VALUE ALERT  Critical Value:  Glucose 612 and Potassium 6.9  Date & Time Notied:  02/03/20 0451  Provider Notified: Dr. Antionette Char  Orders Received/Actions taken: EKG stat and Insulin drip/endotool ordered.

## 2020-02-03 NOTE — Progress Notes (Signed)
Hypoglycemic Event  CBG: 38  Treatment: 8 oz juice/soda  Symptoms: None  Follow-up CBG: Time:1434 CBG Result: 57  Follow-Up -TIME - 1503 - CBG - 66 - Coke and graham crackers given. - Pt alert and oriented  Possible Reasons for Event: change in medications - insulin drip DC/d  Comments/MD notified:Dr Thedore Mins notified and aware    Mark [name]

## 2020-02-03 NOTE — Progress Notes (Addendum)
Notified Dr. Antionette Char that pt's Stat BMET results following NS bolus: potassium 4.2, glucose 433, creatinine 1.49, calcium 8.5. Dr. Antionette Char stated to continue insulin drip. No new orders given. Will continue to monitor pt. Nelda Marseille, RN

## 2020-02-03 NOTE — Progress Notes (Signed)
Notified Dr. Antionette Char that pt's bed was wet with urine and pt seemed "dazed" so nurse checked CBG. CBG meter stated HI: greater than 600. Put in order for stat glucose lab draw per protocol. Lab had already drew CMET, waiting on glucose result. Dr. Antionette Char put in order for Novolog 10 units X1 and NS bolus. Will continue to monitor pt. Nelda Marseille, RN

## 2020-02-03 NOTE — Progress Notes (Addendum)
Inpatient Diabetes Program Recommendations  AACE/ADA: New Consensus Statement on Inpatient Glycemic Control (2015)  Target Ranges:  Prepandial:   less than 140 mg/dL      Peak postprandial:   less than 180 mg/dL (1-2 hours)      Critically ill patients:  140 - 180 mg/dL   Lab Results  Component Value Date   GLUCAP 82 02/03/2020   HGBA1C 8.4 (H) 12/29/2019    Review of Glycemic Control Results for [place]AN, [place] (MRN [id number]) as of 02/03/2020 09:06  Ref. Range 02/03/2020 03:58 02/03/2020 05:46 02/03/2020 06:23 02/03/2020 07:35 02/03/2020 08:33  Glucose-Capillary Latest Ref Range: 70 - 99 mg/dL >790 (HH) 240 (H) 973 (H) 136 (H) 82    Diabetes history: DM type 1 Outpatient Diabetes medications: 70/30 32 units bid Current orders for Inpatient glycemic control: IV insulin transition to: Novolog 0-15 units TID, Novolog 70/30 30 units BID  Inpatient Diabetes Program Recommendations:    Noted significant hyperglycemia of > 600 mg/dL this AM following treatment for hypoglycemia.   Patient is a type 1, thus requires basal insulin.  Noted plan for transition, will follow. Contacted RN to ensure overlap time with IV insulin following administration of scheduled 70/30.  Per previous DM coordinator note from 4/13, "Noted pt states without insulin waiting in ED for refill. Note pt has been in the ED 4 times since march this year. Pt has been set up with [facility] and will be able to get insulin from there. Unsure of status of pt with the clinic. Pt should be able to get insulin $10 at the most." Transition of care consult placed to ensure status and medications.  Pt has been seen in the past by multiple DM coordinators. Not compliant with glucose checks at home. A1c 8.4% is not as high as in the past where he has been around 9%.  Addendum: Spoke with patient regarding outpatient diabetes management. Patient reports having insulin however took more than needed. Has struggled with diabetes and when  to administer insulin due to lack of food and taking 70/30. Reviewed patient's current A1c of 8.4%. Explained what a A1c is and what it measures. Also reviewed goal A1c with patient, importance of good glucose control @ home, and blood sugar goals. Reviewed patho of DM, need for insulin, role of pancreas, vascular changes and commorbidities. Patient has a meter and check occasionally. Was given a meter at last hospitalization. Reports having supplies he needs as of ED visit from the 12th. However, will need more insulin when this supply is out. Patient has no transportation to get to clinic for follow up and future prescriptions. Patient states, " I will try, I usually pan handle for money". TOC consult placed for SW assistance with food insecurity/transportation resources. Patient has no further questions at this time.   Thanks, Lujean Rave, MSN, RNC-OB Diabetes Coordinator [phone] (8a-5p)

## 2020-02-04 LAB — CBC
HCT: 39.4 % (ref 39.0–52.0)
Hemoglobin: 12.6 g/dL — ABNORMAL LOW (ref 13.0–17.0)
MCH: 28.7 pg (ref 26.0–34.0)
MCHC: 32 g/dL (ref 30.0–36.0)
MCV: 89.7 fL (ref 80.0–100.0)
Platelets: 243 10*3/uL (ref 150–400)
RBC: 4.39 MIL/uL (ref 4.22–5.81)
RDW: 12.8 % (ref 11.5–15.5)
WBC: 8.4 10*3/uL (ref 4.0–10.5)
nRBC: 0 % (ref 0.0–0.2)

## 2020-02-04 LAB — GLUCOSE, CAPILLARY
Glucose-Capillary: 491 mg/dL — ABNORMAL HIGH (ref 70–99)
Glucose-Capillary: 553 mg/dL (ref 70–99)
Glucose-Capillary: 360 mg/dL — ABNORMAL HIGH (ref 70–99)
Glucose-Capillary: 189 mg/dL — ABNORMAL HIGH (ref 70–99)
Glucose-Capillary: 257 mg/dL — ABNORMAL HIGH (ref 70–99)
Glucose-Capillary: 75 mg/dL (ref 70–99)
Glucose-Capillary: 73 mg/dL (ref 70–99)
Glucose-Capillary: 109 mg/dL — ABNORMAL HIGH (ref 70–99)
Glucose-Capillary: 180 mg/dL — ABNORMAL HIGH (ref 70–99)
Glucose-Capillary: 228 mg/dL — ABNORMAL HIGH (ref 70–99)

## 2020-02-04 LAB — BASIC METABOLIC PANEL
Anion gap: 10 (ref 5–15)
BUN: 16 mg/dL (ref 6–20)
CO2: 24 mmol/L (ref 22–32)
Calcium: 8.9 mg/dL (ref 8.9–10.3)
Chloride: 99 mmol/L (ref 98–111)
Creatinine, Ser: 0.96 mg/dL (ref 0.61–1.24)
GFR calc Af Amer: >60 mL/min (ref >60)
GFR calc non Af Amer: >60 mL/min (ref >60)
Glucose, Bld: 578 mg/dL (ref 70–99)
Potassium: 4.6 mmol/L (ref 3.5–5.1)
Sodium: 133 mmol/L — ABNORMAL LOW (ref 135–145)

## 2020-02-04 LAB — HEMOGLOBIN A1C
Hgb A1c MFr Bld: 8.8 % — ABNORMAL HIGH (ref 4.8–5.6)
Mean Plasma Glucose: 206 mg/dL

## 2020-02-04 LAB — MAGNESIUM: Magnesium: 1.7 mg/dL (ref 1.7–2.4)

## 2020-02-04 MED ORDER — DEXTROSE-NACL 5-0.45 % IV SOLN: INTRAVENOUS | Status: DC

## 2020-02-04 MED ORDER — DEXTROSE 50 % IV SOLN: 0.0000 mL | INTRAVENOUS | Status: DC | PRN

## 2020-02-04 MED ORDER — INSULIN REGULAR(HUMAN) IN NACL 100-0.9 UT/100ML-% IV SOLN
INTRAVENOUS | Status: DC
  Administered 2020-02-04: 10.5 [IU]/h via INTRAVENOUS
  Filled 2020-02-04: qty 100

## 2020-02-04 MED ORDER — INSULIN ASPART PROT & ASPART (70-30 MIX) 100 UNIT/ML ~~LOC~~ SUSP
25.0000 [IU] | Freq: Two times a day (BID) | SUBCUTANEOUS | Status: DC
  Administered 2020-02-04 – 2020-02-05 (×3): 25 [IU] via SUBCUTANEOUS

## 2020-02-04 MED ORDER — INSULIN ASPART 100 UNIT/ML ~~LOC~~ SOLN
8.0000 [IU] | Freq: Once | SUBCUTANEOUS | Status: AC
  Administered 2020-02-04: 8 [IU] via SUBCUTANEOUS

## 2020-02-04 MED ORDER — INSULIN ASPART 100 UNIT/ML ~~LOC~~ SOLN
0.0000 [IU] | Freq: Every day | SUBCUTANEOUS | Status: DC
  Administered 2020-02-04: 2 [IU] via SUBCUTANEOUS

## 2020-02-04 MED ORDER — SODIUM CHLORIDE 0.9 % IV SOLN: INTRAVENOUS | Status: DC

## 2020-02-04 MED ORDER — INSULIN ASPART 100 UNIT/ML ~~LOC~~ SOLN
0.0000 [IU] | Freq: Three times a day (TID) | SUBCUTANEOUS | Status: DC
  Administered 2020-02-05: 7 [IU] via SUBCUTANEOUS

## 2020-02-04 MED ORDER — SODIUM CHLORIDE 0.9 % IV BOLUS
500.0000 mL | Freq: Once | INTRAVENOUS | Status: AC
  Administered 2020-02-04: 500 mL via INTRAVENOUS

## 2020-02-04 MED ORDER — INSULIN ASPART 100 UNIT/ML ~~LOC~~ SOLN: 0.0000 [IU] | Freq: Three times a day (TID) | SUBCUTANEOUS | Status: DC

## 2020-02-04 NOTE — Progress Notes (Signed)
Notified Dr. Antionette Char that pt's CBG is 553 after NS bolus and Novolog 8 units. Dr. Antionette Char entered orders for endotool and insulin drip.  Will continue to monitor pt. Nelda Marseille, RN

## 2020-02-04 NOTE — Progress Notes (Signed)
Per MD, Dr. Thedore Mins, verbal orders over the phone -   Have patient eat breakfast After eating - give 25 units of 70/30 insulin and stop insulin drip Retake CBG at 1100 Start sliding scale insulin at noon  Change NS infusion to 75 ml/hr  Pt CBG at 1100 - 75 - MD notified

## 2020-02-04 NOTE — Progress Notes (Signed)
Notified Dr. Antionette Char that pt's bed sheets are wet. Pt is slightly dazed. Checked pt's CBG result: 491. At same time CBG noted to be >600 in chart review, however CBG was only checked once. MD ordered Novolog 8 units x1 and 500cc NS bolus. Will continue to monitor pt. Nelda Marseille, RN

## 2020-02-04 NOTE — Progress Notes (Signed)
PROGRESS NOTE                                                                                                                                                                                                             Patient Demographics:    Mark [name], is a 34 y.o. male, DOB - [birth year], FVC:[id number]  Admit date - 02/02/2020   Admitting Physician Leroy Sea, MD  Outpatient Primary MD for the patient is Patient, No Pcp Per  LOS - 1  Chief Complaint  Patient presents with  . Leg Pain       Brief Narrative  34 year old male with past medical history of diabetes mellitus type 1, bipolar disorder, better polyneuropathy, seizure disorder who presents to [facility] emergency department via EMS after the patient reports that his roommate "found me at home."  Work-up in the ER revealed that he was in DKA and was admitted in the hospital.   Subjective:   Patient in bed, appears comfortable, denies any headache, no fever, no chest pain or pressure, no shortness of breath , no abdominal pain. No focal weakness.   Assessment  & Plan :     1.  DKA in a patient with DM type I.  Question compliance, poor outpatient control due to hyperglycemia with A1c 8.8, was treated with IV insulin drip and IV fluids, gap seems to have resolved, her interest has been switched off however sugars quite erratic, have placed him on 70/30 twice daily he claims he takes it 3 times a day however I doubt he does, sliding scale in place will monitor and adjust as needed.  He is on much lower than home dose 70/30 right now.  Lab Results  Component Value Date   HGBA1C 8.8 (H) 02/03/2020   CBG (last 3)  Recent Labs    02/04/20 0735 02/04/20 0807 02/04/20 1100  GLUCAP 257* 189* 75     2.  Bipolar 1 disorder with psychotic features.  Home medications continued.  Denies any recent stressors, suicidal homicidal.  No acute issues, outpatient PCP and psych follow-up discharge.  3.   Smoking.  Counseled to quit.  4.  Diabetic neuropathy.  Supportive care.  5. Hypothyroidism.  Home dose Synthroid continued.  Recent TSH was 6.4.  ECP to monitor and adjust.  6. HX of medical noncompliance.  Counseled.    Family Communication  :  none  Code Status :  Full  Disposition Plan  : Stay in the  hospital for treatment of DKA and dehydration  Consults  : Diabetic educator  Procedures  : None  DVT Prophylaxis  :  Lovenox    Lab Results  Component Value Date   PLT 243 02/04/2020    Diet :  Diet Order            Diet Carb Modified Fluid consistency: Thin; Room service appropriate? Yes  Diet effective now               Inpatient Medications Scheduled Meds: . divalproex  500 mg Oral Q12H  . enoxaparin (LOVENOX) injection  40 mg Subcutaneous Q24H  . escitalopram  10 mg Oral Daily  . insulin aspart  0-5 Units Subcutaneous QHS  . insulin aspart  0-9 Units Subcutaneous TID WC  . insulin aspart protamine- aspart  25 Units Subcutaneous BID WC  . levothyroxine  100 mcg Oral QAC breakfast  . nicotine  14 mg Transdermal Daily  . pregabalin  150 mg Oral BID  . QUEtiapine  100 mg Oral QHS  . traZODone  50 mg Oral QHS   Continuous Infusions:  PRN Meds:.[DISCONTINUED] ondansetron **OR** ondansetron (ZOFRAN) IV, polyethylene glycol  Antibiotics  :   Anti-infectives (From admission, onward)   None          Objective:   Vitals:   02/04/20 0017 02/04/20 0405 02/04/20 0500 02/04/20 0737  BP: 131/80 105/64  107/76  Pulse: 85 88 (!) 102 87  Resp: 18 14 18 13   Temp: 97.7 F (36.5 C) 97.7 F (36.5 C)  98.5 F (36.9 C)  TempSrc: Oral Oral  Axillary  SpO2: 94% (!) 88% 92% 94%  Weight:      Height:        SpO2: 94 %  Wt Readings from Last 3 Encounters:  02/02/20 106.7 kg  01/01/20 113.4 kg  12/29/19 113.4 kg     Intake/Output Summary (Last 24 hours) at 02/04/2020 1123 Last data filed at 02/04/2020 0938 Gross per 24 hour  Intake 4504.93 ml  Output  2950 ml  Net 1554.93 ml     Physical Exam  Awake Alert, No new F.N deficits, Normal affect [place].AT,PERRAL Supple Neck,No JVD, No cervical lymphadenopathy appriciated.  Symmetrical Chest wall movement, Good air movement bilaterally, CTAB RRR,No Gallops, Rubs or new Murmurs, No Parasternal Heave +ve B.Sounds, Abd Soft, No tenderness, No organomegaly appriciated, No rebound - guarding or rigidity. No Cyanosis, Clubbing or edema, No new Rash or bruise    Data Review:    Recent Labs  Lab 02/02/20 0206 02/02/20 0216 02/03/20 0346 02/04/20 0459  WBC 10.5  --  8.4 8.4  HGB 13.9 15.0 13.8 12.6*  HCT 42.8 44.0 42.4 39.4  PLT 350  --  273 243  MCV 90.1  --  89.1 89.7  MCH 29.3  --  29.0 28.7  MCHC 32.5  --  32.5 32.0  RDW 13.1  --  13.0 12.8  LYMPHSABS 2.8  --  2.9  --   MONOABS 1.0  --  0.6  --   EOSABS 0.2  --  0.3  --   BASOSABS 0.1  --  0.1  --     Recent Labs  Lab 02/02/20 0216 02/03/20 0346 02/03/20 0611 02/03/20 0811 02/04/20 0459  NA 140 130* 139  --  133*  K 3.1* 6.9* 4.2  --  4.6  CL 104 97* 103  --  99  CO2  --  21* 22  --  24  GLUCOSE  52* 612* 433*  --  578*  BUN 6 17 18   --  16  CREATININE 1.00 1.28* 1.49*  --  0.96  CALCIUM  --  8.6* 8.5*  --  8.9  AST  --  19  --   --   --   ALT  --  15  --   --   --   ALKPHOS  --  97  --   --   --   BILITOT  --  1.0  --   --   --   ALBUMIN  --  3.6  --   --   --   MG  --  1.7  --   --  1.7  HGBA1C  --   --   --  8.8*  --     Recent Labs  Lab 02/02/20 0500  SARSCOV2NAA NEGATIVE    ------------------------------------------------------------------------------------------------------------------ No results for input(s): CHOL, HDL, LDLCALC, TRIG, CHOLHDL, LDLDIRECT in the last 72 hours.  Lab Results  Component Value Date   HGBA1C 8.8 (H) 02/03/2020   ------------------------------------------------------------------------------------------------------------------ No results for input(s): TSH, T4TOTAL, T3FREE,  THYROIDAB in the last 72 hours.  Invalid input(s): FREET3 ------------------------------------------------------------------------------------------------------------------ No results for input(s): VITAMINB12, FOLATE, FERRITIN, TIBC, IRON, RETICCTPCT in the last 72 hours.  Coagulation profile No results for input(s): INR, PROTIME in the last 168 hours.  No results for input(s): DDIMER in the last 72 hours.  Cardiac Enzymes No results for input(s): CKMB, TROPONINI, MYOGLOBIN in the last 168 hours.  Invalid input(s): CK ------------------------------------------------------------------------------------------------------------------ No results found for: BNP  Micro Results Recent Results (from the past 240 hour(s))  SARS CORONAVIRUS 2 (TAT 6-24 HRS) Nasopharyngeal Nasopharyngeal Swab     Status: None   Collection Time: 02/02/20  5:00 AM   Specimen: Nasopharyngeal Swab  Result Value Ref Range Status   SARS Coronavirus 2 NEGATIVE NEGATIVE Final    Comment: (NOTE) SARS-CoV-2 target nucleic acids are NOT DETECTED. The SARS-CoV-2 RNA is generally detectable in upper and lower respiratory specimens during the acute phase of infection. Negative results do not preclude SARS-CoV-2 infection, do not rule out co-infections with other pathogens, and should not be used as the sole basis for treatment or other patient management decisions. Negative results must be combined with clinical observations, patient history, and epidemiological information. The expected result is Negative. Fact Sheet for Patients: SugarRoll.be Fact Sheet for Healthcare Providers: https://www.woods-mathews.com/ This test is not yet approved or cleared by the [place] FDA and  has been authorized for detection and/or diagnosis of SARS-CoV-2 by FDA under an Emergency Use Authorization (EUA). This EUA will remain  in effect (meaning this test can be used) for the duration of  the COVID-19 declaration under Section 56 4(b)(1) of the Act, 21 U.S.C. section 360bbb-3(b)(1), unless the authorization is terminated or revoked sooner. Performed at [facility], [place] [address] [place], [place] [address]   MRSA PCR Screening     Status: None   Collection Time: 02/02/20  7:54 PM   Specimen: Nasal Mucosa; Nasopharyngeal  Result Value Ref Range Status   MRSA by PCR NEGATIVE NEGATIVE Final    Comment:        The GeneXpert MRSA Assay (FDA approved for NASAL specimens only), is one component of a comprehensive MRSA colonization surveillance program. It is not intended to diagnose MRSA infection nor to guide or monitor treatment for MRSA infections. Performed at [facility], [place] [address] [place], [place] [address]  Radiology Reports No results found.  Time Spent in minutes  30   Susa Raring M.D on 02/04/2020 at 11:23 AM  To page go to [email or web address] - password [email or web address]

## 2020-02-04 NOTE — Progress Notes (Signed)
Inpatient Diabetes Program Recommendations  AACE/ADA: New Consensus Statement on Inpatient Glycemic Control (2015)  Target Ranges:  Prepandial:   less than 140 mg/dL      Peak postprandial:   less than 180 mg/dL (1-2 hours)      Critically ill patients:  140 - 180 mg/dL   Lab Results  Component Value Date   GLUCAP 189 (H) 02/04/2020   HGBA1C 8.8 (H) 02/03/2020    Review of Glycemic Control Results for Mark [name], Mark [name] (MRN [id number]) as of 02/04/2020 08:41  Ref. Range 02/04/2020 06:18 02/04/2020 07:01 02/04/2020 07:35 02/04/2020 08:07  Glucose-Capillary Latest Ref Range: 70 - 99 mg/dL 130 (HH) 865 (H) 784 (H) 189 (H)   Diabetes history:DM type 1 Outpatient Diabetes medications:70/30 32 units bid Current orders for Inpatient glycemic control:IV insulin transition to: Novolog 0-9 units TID, Novolog 0-5 units QHS Novolog 70/30 25 units BID  Inpatient Diabetes Program Recommendations:  Noted significant hyperglycemia again this AM of > 600 mg/dL. Patient missed PM dose of Novolog 70/30 per MD order.  Assuming this was held due to hypoglycemic event of 38 mg/dL, following transition and administration of Novolog 70/30 30 units dose and Novolog 3 units of correction.  Of note, RN followed orders for transition per protocol and listed orders per MD and patient received a total of 4.2 units during 2 hour transition that was auto adjusted based on total insulin on board.   Doses were reduced for transition today. Will follow.  Patient is a type 1, thus requires basal insulin, thus would not recommend missing doses, rather would decrease dose if patient has another hypo event.   Thanks, Lujean Rave, MSN, CDE, RNC-OB Diabetes Coordinator [phone] (8a-5p)

## 2020-02-04 NOTE — Progress Notes (Signed)
CSW received consult from [facility] regarding patient not having a refrigerator to store his insulin in and suggested [employer] a project referral. CSW spoke with Eunice Blase at [employer] and she reported that their program is only for patients who are homeless and require short term housing for medical recovery. Debbie suggested setting patient up with Dr. Shan Levans at [facility] clinic for medication assistance as well as their congregational nurse program.   Cristobal Goldmann LCSW

## 2020-02-05 LAB — GLUCOSE, CAPILLARY
Glucose-Capillary: 318 mg/dL — ABNORMAL HIGH (ref 70–99)
Glucose-Capillary: 174 mg/dL — ABNORMAL HIGH (ref 70–99)
Glucose-Capillary: 37 mg/dL — CL (ref 70–99)
Glucose-Capillary: 89 mg/dL (ref 70–99)

## 2020-02-05 LAB — BASIC METABOLIC PANEL
Anion gap: 10 (ref 5–15)
BUN: 15 mg/dL (ref 6–20)
CO2: 25 mmol/L (ref 22–32)
Calcium: 8.8 mg/dL — ABNORMAL LOW (ref 8.9–10.3)
Chloride: 104 mmol/L (ref 98–111)
Creatinine, Ser: 0.81 mg/dL (ref 0.61–1.24)
GFR calc Af Amer: >60 mL/min (ref >60)
GFR calc non Af Amer: >60 mL/min (ref >60)
Glucose, Bld: 261 mg/dL — ABNORMAL HIGH (ref 70–99)
Potassium: 4 mmol/L (ref 3.5–5.1)
Sodium: 139 mmol/L (ref 135–145)

## 2020-02-05 LAB — MAGNESIUM: Magnesium: 1.7 mg/dL (ref 1.7–2.4)

## 2020-02-05 MED ORDER — DEXTROSE 50 % IV SOLN
INTRAVENOUS | Status: AC
  Filled 2020-02-05: qty 50

## 2020-02-05 MED ORDER — INSULIN ASPART 100 UNIT/ML ~~LOC~~ SOLN: 0.0000 [IU] | Freq: Three times a day (TID) | SUBCUTANEOUS | 0 refills | Status: DC

## 2020-02-05 MED ORDER — INSULIN ASPART PROT & ASPART (70-30 MIX) 100 UNIT/ML ~~LOC~~ SUSP: 30.0000 [IU] | Freq: Two times a day (BID) | SUBCUTANEOUS | 0 refills | Status: DC

## 2020-02-05 MED ORDER — DEXTROSE 50 % IV SOLN
25.0000 mL | Freq: Once | INTRAVENOUS | Status: AC
  Administered 2020-02-05: 25 mL via INTRAVENOUS

## 2020-02-05 MED FILL — NOVOLOG FLEXPEN SYRINGE: 100 | 15 days supply | Qty: 6 | Fill #0

## 2020-02-05 MED FILL — PENTIPS 31G X 8 MM MISC: 31G X 8 MM | 30 days supply | Qty: 100 | Fill #0

## 2020-02-05 MED FILL — NOVOLOG MIX 70-30 FLEXPEN S: (70-30) 100 | 15 days supply | Qty: 9 | Fill #0

## 2020-02-05 NOTE — Progress Notes (Signed)
Insulin given to pt for d/c. Pt reports not having other medications. Dr. Thedore Mins aware. Pt was provided with all medication last admission on 3/29 and should have enough to get to first appt taht is set up for him. Pt verbalizes understanding that MD can not write for more prescriptions at this time. Pt is taken down in w/c with d/c paperwork, insulin and bus ticket.

## 2020-02-05 NOTE — TOC Progression Note (Signed)
Transition of Care [facility]) - Progression Note    Patient Details  Name: Mark [name] MRN: [id number] Date of Birth: 08/15/1986  Transition of Care [facility]) CM/SW Contact  Ranelle Oyster, Student-Social Work Phone Number: 02/05/2020, 11:14 AM  Clinical Narrative:     CSW spoke with patient at bedside and provided pt with community resources such as housing and food. CSW also gave patient a bus pass for transportation. All questions were answered at this time.    Expected Discharge Plan: Home/Self Care    Expected Discharge Plan and Services Expected Discharge Plan: Home/Self Care       Living arrangements for the past 2 months: Single Family Home Expected Discharge Date: 02/05/20                                     Social Determinants of Health (SDOH) Interventions    Readmission Risk Interventions No flowsheet data found.

## 2020-02-05 NOTE — TOC Initial Note (Addendum)
Transition of Care [facility]) - Initial/Assessment Note    Patient Details  Name: Mark [name] MRN: [id number] Date of Birth: 03/03/1986  Transition of Care [facility]) CM/SW Contact:    Maryclare Labrador, RN Phone Number: 02/05/2020, 8:40 AM  Clinical Narrative:    CM spoke with pt via phone.  Pt informed CM that he has been staying with a friend on the [facility] address listed in [place].  Pt confirms that he will discharge back to friends address at discharge.  Pt informed CM that he does not have a working refrigerator in the home and therefore he had to discard his insulin that needed to be kept cold.  Pt also states he doesn't have a phone and he does not know his roomate's phone number, per pt all the numbers listed in epic are not valid and pt requested no one contact "Heather" list in [place].Marland Kitchen           Expected Discharge Plan: Home/Self Care     Patient Goals and CMS Choice Patient states their goals for this hospitalization and ongoing recovery are:: Pt did not state a specific goal CMS Medicare.gov Compare Post Acute Care list provided to:: Patient Choice offered to / list presented to : Patient  Expected Discharge Plan and Services Expected Discharge Plan: Home/Self Care       Living arrangements for the past 2 months: Single Family Home Expected Discharge Date: 02/05/20                                    Prior Living Arrangements/Services Living arrangements for the past 2 months: Single Family Home Lives with:: Friends Patient language and need for interpreter reviewed:: Yes Do you feel safe going back to the place where you live?: Yes      Need for Family Participation in Patient Care: Yes (Comment) Care giver support system in place?: No (comment)   Criminal Activity/Legal Involvement Pertinent to Current Situation/Hospitalization: No - Comment as needed  Activities of Daily Living Home Assistive Devices/Equipment: None ADL Screening (condition at time  of admission) Patient's cognitive ability adequate to safely complete daily activities?: Yes Is the patient deaf or have difficulty hearing?: No Does the patient have difficulty seeing, even when wearing glasses/contacts?: No Does the patient have difficulty concentrating, remembering, or making decisions?: No Patient able to express need for assistance with ADLs?: Yes Does the patient have difficulty dressing or bathing?: No Independently performs ADLs?: Yes (appropriate for developmental age) Does the patient have difficulty walking or climbing stairs?: No Weakness of Legs: Both Weakness of Arms/Hands: None  Permission Sought/Granted   Permission granted to share information with : Yes, Verbal Permission Granted              Emotional Assessment   Attitude/Demeanor/Rapport: Self-Confident, Gracious Affect (typically observed): Accepting Orientation: : Oriented to Self, Oriented to Place, Oriented to  Time, Oriented to Situation      Admission diagnosis:  Hypoglycemia [E16.2] Patient Active Problem List   Diagnosis Date Noted  . Hypoglycemia 02/03/2020  . Hypothyroidism 02/02/2020  . Diabetic polyneuropathy associated with type 1 diabetes mellitus ([place]) 02/02/2020  . Uncontrolled type 1 diabetes mellitus with hypoglycemia without coma, with long-term current use of insulin ([name]) 12/30/2019  . DKA (diabetic ketoacidosis) ([place]) 12/29/2019  . Homelessness 12/29/2019  . Marijuana abuse 12/29/2019  . Tobacco dependence 12/29/2019  . Seizure disorder ([place]) 12/29/2019  .  Suicidal ideation   . Bipolar I disorder, most recent episode depressed, severe without psychotic features (HCC)   . Adjustment disorder with depressed mood 12/07/2017  . Noncompliance 10/17/2017  . Dysthymia 10/08/2017  . 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 Obstructive hydrocephalus (HCC) 09/19/2017  . MDD (major depressive disorder) 08/22/2017  . 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 Abdominal pain 08/18/2017  . Hyponatremia 08/18/2017  . DKA (diabetic ketoacidoses) (HCC) 11/24/2015  . Diabetic keto-acidosis (HCC) 10/23/2015   PCP:  Patient, No Pcp Per Pharmacy:   [name] [place], [place] - [name] [place] [address] Phone: [phone] Fax: [phone]     Social Determinants of Health (SDOH) Interventions    Readmission Risk Interventions No flowsheet data found.

## 2020-02-05 NOTE — Discharge Summary (Signed)
Mark [name] CHJ:[id number] DOB: 28-Jan-1986 DOA: 02/02/2020  PCP: Patient, No Pcp Per  Admit date: 02/02/2020  Discharge date: 02/05/2020  Admitted From: Homeless   Disposition:  Per SW   Recommendations for Outpatient Follow-up:   Follow up with PCP in 1-2 weeks  PCP Please obtain BMP/CBC, 2 view CXR in 1week,  (see Discharge instructions)   PCP Please follow up on the following pending results:    Home Health: None  Equipment/Devices: None  Consultations: None  Discharge Condition: Stable    CODE STATUS: Full    Diet Recommendation: Heart Healthy Low Carb    Chief Complaint  Patient presents with  . Leg Pain     Brief history of present illness from the day of admission and additional interim summary    34 year old male with past medical history of diabetes mellitus type 1, bipolar disorder,better polyneuropathy, seizure disorder who presents to [facility] emergency department via EMS after the patient reports that his roommate "found me at home."  Work-up in the ER revealed that he was in DKA and was admitted in the hospital.                                                                 Hospital Course   1.  DKA in a patient with DM type I.  Question compliance, poor outpatient control due to hyperglycemia with A1c 8.8, was treated with IV insulin drip and IV fluids, gap seems to have resolved,  glycemic control is tricky as he is brittle however he is stable on 7030 less than home dose along with sliding scale, will be discharged, 1 month supply will be provided, he already has testing supplies, case management assisting with procurement of medications along with transition of care pharmacy, case management also assisting with outpatient PCP follow-up.  Lab Results  Component Value Date   HGBA1C 8.8 (H) 02/03/2020    2.  Bipolar 1 disorder with psychotic features.  Home medications continued.  Denies any recent stressors, suicidal homicidal.  No acute issues, outpatient PCP and psych follow-up discharge as desired.  3.  Smoking.  Counseled to quit.  4.  Diabetic neuropathy.  Supportive care.  5. Hypothyroidism.  Home dose Synthroid continued.  Recent TSH was 6.4.  PCP to monitor and adjust.  6. HX of medical noncompliance.  Counseled.   Discharge diagnosis     Principal Problem:   Uncontrolled type 1 diabetes mellitus with hypoglycemia without coma, with long-term current use of insulin (HCC) Active Problems:   Bipolar I disorder, most recent episode depressed, severe without psychotic features ([place])   Tobacco dependence   Hypothyroidism   Diabetic polyneuropathy associated with type 1 diabetes mellitus ([place])   Hypoglycemia    Discharge instructions    Discharge Instructions  Discharge instructions   Complete by: As directed    Follow with Primary MD in 7 days   Get CBC, CMP, 2 view Chest X ray -  checked next visit within 1 week by Primary MD    Activity: As tolerated with Full fall precautions use walker/cane & assistance as needed  Disposition Home   Diet: Heart Healthy Low Carb  Accuchecks 4 times/day, Once in AM empty stomach and then before each meal. Log in all results and show them to your Prim.MD in 3 days. If any glucose reading is under 80 or above 300 call your Prim MD immidiately. Follow Low glucose instructions for glucose under 80 as instructed.  Special Instructions: If you have smoked or chewed Tobacco  in the last 2 yrs please stop smoking, stop any regular Alcohol  and or any Recreational drug use.  On your next visit with your primary care physician please Get Medicines reviewed and adjusted.  Please request your Prim.MD to go over all Hospital Tests and Procedure/Radiological results at the follow up, please get all  Hospital records sent to your Prim MD by signing hospital release before you go home.  If you experience worsening of your admission symptoms, develop shortness of breath, life threatening emergency, suicidal or homicidal thoughts you must seek medical attention immediately by calling 911 or calling your MD immediately  if symptoms less severe.  You Must read complete instructions/literature along with all the possible adverse reactions/side effects for all the Medicines you take and that have been prescribed to you. Take any new Medicines after you have completely understood and accpet all the possible adverse reactions/side effects.   Increase activity slowly   Complete by: As directed       Discharge Medications   Allergies as of 02/05/2020      Reactions   Mushroom Extract Complex Hives, Itching, Nausea And Vomiting   Penicillins Anaphylaxis, Hives, Swelling   Has patient had a PCN reaction causing immediate rash, facial/tongue/throat swelling, SOB or lightheadedness with hypotension: Yes Has patient had a PCN reaction causing severe rash involving mucus membranes or skin necrosis: No Has patient had a PCN reaction that required hospitalization: Yes Has patient had a PCN reaction occurring within the last 10 years: Yes If all of the above answers are "NO", then may proceed with Cephalosporin use.   Sulfa Antibiotics Anaphylaxis, Hives   Clindamycin/lincomycin Hives      Medication List    TAKE these medications   blood glucose meter kit and supplies Kit Dispense based on patient and insurance preference. Use up to four times daily as directed. (FOR ICD-9 250.00, 250.01).   divalproex 500 MG DR tablet Commonly known as: DEPAKOTE Take 1 tablet (500 mg total) by mouth every 12 (twelve) hours.   escitalopram 10 MG tablet Commonly known as: LEXAPRO Take 1 tablet (10 mg total) by mouth daily.   insulin aspart 100 UNIT/ML injection Commonly known as: novoLOG Inject 0-12 Units  into the skin 3 (three) times daily with meals. Inject 0-12 units into the skin three times a day before meals: Blood Sugar: 60-110= none,111-150= 2 units,  151-200= 4 units,, 201-250= 6 units, 251-300= 8 units, 301-350= 10 units, >350= 12 units and CALL MD What changed:   how much to take  how to take this  when to take this  additional instructions   insulin aspart protamine- aspart (70-30) 100 UNIT/ML injection Commonly known as: NOVOLOG MIX 70/30 Inject 0.3 mLs (30 Units total) into  the skin 2 (two) times daily with a meal. What changed: how much to take   levothyroxine 100 MCG tablet Commonly known as: SYNTHROID Take 1 tablet (100 mcg total) by mouth daily before breakfast.   Pen Needles 3/16" 31G X 5 MM Misc 1 Dose by Does not apply route 3 (three) times daily.   pregabalin 150 MG capsule Commonly known as: LYRICA Take 1 capsule (150 mg total) by mouth 2 (two) times daily.   QUEtiapine 100 MG tablet Commonly known as: SEROQUEL Take 1 tablet (100 mg total) by mouth at bedtime.   traZODone 50 MG tablet Commonly known as: DESYREL Take 1 tablet (50 mg total) by mouth at bedtime.       Follow-up Information    [place]. Schedule an appointment as soon as possible for a visit in 1 week(s).   Contact information: [place] [phone]          Major procedures and Radiology Reports - PLEASE review detailed and final reports thoroughly  -       No results found.  Micro Results     Recent Results (from the past 240 hour(s))  SARS CORONAVIRUS 2 (TAT 6-24 HRS) Nasopharyngeal Nasopharyngeal Swab     Status: None   Collection Time: 02/02/20  5:00 AM   Specimen: Nasopharyngeal Swab  Result Value Ref Range Status   SARS Coronavirus 2 NEGATIVE NEGATIVE Final    Comment: (NOTE) SARS-CoV-2 target nucleic acids are NOT DETECTED. The SARS-CoV-2 RNA is generally detectable in upper and  lower respiratory specimens during the acute phase of infection. Negative results do not preclude SARS-CoV-2 infection, do not rule out co-infections with other pathogens, and should not be used as the sole basis for treatment or other patient management decisions. Negative results must be combined with clinical observations, patient history, and epidemiological information. The expected result is Negative. Fact Sheet for Patients: SugarRoll.be Fact Sheet for Healthcare Providers: https://www.woods-mathews.com/ This test is not yet approved or cleared by the [place] FDA and  has been authorized for detection and/or diagnosis of SARS-CoV-2 by FDA under an Emergency Use Authorization (EUA). This EUA will remain  in effect (meaning this test can be used) for the duration of the COVID-19 declaration under Section 56 4(b)(1) of the Act, 21 U.S.C. section 360bbb-3(b)(1), unless the authorization is terminated or revoked sooner. Performed at [facility], [place] [address] [place], [place] [address]   MRSA PCR Screening     Status: None   Collection Time: 02/02/20  7:54 PM   Specimen: Nasal Mucosa; Nasopharyngeal  Result Value Ref Range Status   MRSA by PCR NEGATIVE NEGATIVE Final    Comment:        The GeneXpert MRSA Assay (FDA approved for NASAL specimens only), is one component of a comprehensive MRSA colonization surveillance program. It is not intended to diagnose MRSA infection nor to guide or monitor treatment for MRSA infections. Performed at [facility], [place] [address] [place], [place] [address]     Today   Subjective    Mark [name] today has no headache,no chest abdominal pain,no new weakness tingling or numbness, feels much better     Objective   Blood pressure 110/61, pulse 68, temperature 97.7 F (36.5 C), temperature source Oral, resp. rate 13, height _0  (1.88 m), weight 106.7 kg, SpO2 97  %.   Intake/Output Summary (Last 24 hours) at 02/05/2020 [phone]  Last data filed at 02/05/2020 0832 Gross per 24 hour  Intake 1440 ml  Output 2620 ml  Net -1180 ml    Exam Awake Alert,   No new F.N deficits, Normal affect [place].AT,PERRAL Supple Neck,No JVD, No cervical lymphadenopathy appriciated.  Symmetrical Chest wall movement, Good air movement bilaterally, CTAB RRR,No Gallops,Rubs or new Murmurs, No Parasternal Heave +ve B.Sounds, Abd Soft, Non tender, No organomegaly appriciated, No rebound -guarding or rigidity. No Cyanosis, Clubbing or edema, No new Rash or bruise   Data Review   CBC w Diff:  Lab Results  Component Value Date   WBC 8.4 02/04/2020   HGB 12.6 (L) 02/04/2020   HGB 15.2 01/20/2015   HCT 39.4 02/04/2020   HCT 47.3 01/20/2015   PLT 243 02/04/2020   PLT 313 01/20/2015   LYMPHOPCT 35 02/03/2020   LYMPHOPCT 28.2 07/19/2014   MONOPCT 7 02/03/2020   MONOPCT 4.7 07/19/2014   EOSPCT 4 02/03/2020   EOSPCT 1.3 07/19/2014   BASOPCT 1 02/03/2020   BASOPCT 0.1 07/19/2014    CMP:  Lab Results  Component Value Date   NA 139 02/05/2020   NA 123 (L) 01/20/2015   K 4.0 02/05/2020   K 4.3 01/20/2015   CL 104 02/05/2020   CL 91 (L) 01/20/2015   CO2 25 02/05/2020   CO2 20 (L) 01/20/2015   BUN 15 02/05/2020   BUN 12 01/20/2015   CREATININE 0.81 02/05/2020   CREATININE 1.22 01/20/2015   PROT 6.9 02/03/2020   PROT 8.1 01/20/2015   ALBUMIN 3.6 02/03/2020   ALBUMIN 4.7 01/20/2015   BILITOT 1.0 02/03/2020   BILITOT 0.9 01/20/2015   ALKPHOS 97 02/03/2020   ALKPHOS 123 01/20/2015   AST 19 02/03/2020   AST 30 01/20/2015   ALT 15 02/03/2020   ALT 16 (L) 01/20/2015  . Lab Results  Component Value Date   HGBA1C 8.8 (H) 02/03/2020      Total Time in preparing paper work, data evaluation and todays exam - 59 minutes  Mark [name] M.D on 02/05/2020 at 9:56 AM  [facility]   Office  [phone]

## 2020-02-05 NOTE — Discharge Instructions (Signed)
Hypoglycemia Hypoglycemia is when the sugar (glucose) level in your blood is too low. Signs of low blood sugar may include:  Feeling: ? Hungry. ? Worried or nervous (anxious). ? Sweaty and clammy. ? Confused. ? Dizzy. ? Sleepy. ? Sick to your stomach (nauseous).  Having: ? A fast heartbeat. ? A headache. ? A change in your vision. ? Tingling or no feeling (numbness) around your mouth, lips, or tongue. ? Jerky movements that you cannot control (seizure).  Having trouble with: ? Moving (coordination). ? Sleeping. ? Passing out (fainting). ? Getting upset easily (irritability). Low blood sugar can happen to people who have diabetes and people who do not have diabetes. Low blood sugar can happen quickly, and it can be an emergency. Treating low blood sugar Low blood sugar is often treated by eating or drinking something sugary right away, such as:  Fruit juice, 4-6 oz (120-150 mL).  Regular soda (not diet soda), 4-6 oz (120-150 mL).  Low-fat milk, 4 oz (120 mL).  Several pieces of hard candy.  Sugar or honey, 1 Tbsp (15 mL). Treating low blood sugar if you have diabetes If you can think clearly and swallow safely, follow the 15:15 rule:  Take 15 grams of a fast-acting carb (carbohydrate). Talk with your doctor about how much you should take.  Always keep a source of fast-acting carb with you, such as: ? Sugar tablets (glucose pills). Take 3-4 pills. ? 6-8 pieces of hard candy. ? 4-6 oz (120-150 mL) of fruit juice. ? 4-6 oz (120-150 mL) of regular (not diet) soda. ? 1 Tbsp (15 mL) honey or sugar.  Check your blood sugar 15 minutes after you take the carb.  If your blood sugar is still at or below 70 mg/dL (3.9 mmol/L), take 15 grams of a carb again.  If your blood sugar does not go above 70 mg/dL (3.9 mmol/L) after 3 tries, get help right away.  After your blood sugar goes back to normal, eat a meal or a snack within 1 hour.  Treating very low blood sugar If  your blood sugar is at or below 54 mg/dL (3 mmol/L), you have very low blood sugar (severe hypoglycemia). This may also cause:  Passing out.  Jerky movements you cannot control (seizure).  Losing consciousness (coma). This is an emergency. Do not wait to see if the symptoms will go away. Get medical help right away. Call your local emergency services (911 in the [place] Do not drive yourself to the hospital. If you have very low blood sugar and you cannot eat or drink, you may need a glucagon shot (injection). A family member or friend should learn how to check your blood sugar and how to give you a glucagon shot. Ask your doctor if you need to have a glucagon shot kit at home. Follow these instructions at home: General instructions  Take over-the-counter and prescription medicines only as told by your doctor.  Stay aware of your blood sugar as told by your doctor.  Limit alcohol intake to no more than 1 drink a day for nonpregnant women and 2 drinks a day for men. One drink equals 12 oz of beer (355 mL), 5 oz of wine (148 mL), or 1 oz of hard liquor (44 mL).  Keep all follow-up visits as told by your doctor. This is important. If you have diabetes:   Follow your diabetes care plan as told by your doctor. Make sure you: ? Know the signs of low blood  sugar. ? Take your medicines as told. ? Follow your exercise and meal plan. ? Eat on time. Do not skip meals. ? Check your blood sugar as often as told by your doctor. Always check it before and after exercise. ? Follow your sick day plan when you cannot eat or drink normally. Make this plan ahead of time with your doctor.  Share your diabetes care plan with: ? Your work or school. ? People you live with.  Check your pee (urine) for ketones: ? When you are sick. ? As told by your doctor.  Carry a card or wear jewelry that says you have diabetes. Contact a doctor if:  You have trouble keeping your blood sugar in your target  range.  You have low blood sugar often. Get help right away if:  You still have symptoms after you eat or drink something sugary.  Your blood sugar is at or below 54 mg/dL (3 mmol/L).  You have jerky movements that you cannot control.  You pass out. These symptoms may be an emergency. Do not wait to see if the symptoms will go away. Get medical help right away. Call your local emergency services (911 in the [place] Do not drive yourself to the hospital. Summary  Hypoglycemia happens when the level of sugar (glucose) in your blood is too low.  Low blood sugar can happen to people who have diabetes and people who do not have diabetes. Low blood sugar can happen quickly, and it can be an emergency.  Make sure you know the signs of low blood sugar and know how to treat it.  Always keep a source of sugar (fast-acting carb) with you to treat low blood sugar. This information is not intended to replace advice given to you by your health care provider. Make sure you discuss any questions you have with your health care provider. Document Revised: 01/29/2019 Document Reviewed: 11/11/2015 Elsevier Patient Education  [place].   Preventing Hypoglycemia Hypoglycemia occurs when the level of sugar (glucose) in the blood is too low. Hypoglycemia can happen in people who do or do not have diabetes (diabetes mellitus). It can develop quickly, and it can be a medical emergency. For most people with diabetes, a blood glucose level below 70 mg/dL (3.9 mmol/L) is considered hypoglycemia. Glucose is a type of sugar that provides the body's main source of energy. Certain hormones (insulin and glucagon) control the level of glucose in the blood. Insulin lowers blood glucose, and glucagon increases blood glucose. Hypoglycemia can result from having too much insulin in the bloodstream, or from not eating enough food that contains glucose. Your risk for hypoglycemia is higher:  If you take insulin or  diabetes medicines to help lower your blood glucose or help your body make more insulin.  If you skip or delay a meal or snack.  If you are ill.  During and after exercise. You can prevent hypoglycemia by working with your health care provider to adjust your meal plan as needed and by taking other precautions. How can hypoglycemia affect me? Mild symptoms Mild hypoglycemia may not cause any symptoms. If you do have symptoms, they may include:  Hunger.  Anxiety.  Sweating and feeling clammy.  Dizziness or feeling light-headed.  Sleepiness.  Nausea.  Increased heart rate.  Headache.  Blurry vision.  Irritability.  Tingling or numbness around the mouth, lips, or tongue.  A change in coordination.  Restless sleep. If mild hypoglycemia is not recognized and treated, it  can quickly become moderate or severe hypoglycemia. Moderate symptoms Moderate hypoglycemia can cause:  Mental confusion and poor judgment.  Behavior changes.  Weakness.  Irregular heartbeat. Severe symptoms Severe hypoglycemia is a medical emergency. It can cause:  Fainting.  Seizures.  Loss of consciousness (coma).  Death. What nutrition changes can be made?  Work with your health care provider or diet and nutrition specialist (dietitian) to make a healthy meal plan that is right for you. Follow your meal plan carefully.  Eat meals at regular times.  If recommended by your health care provider, have snacks between meals.  Donot skip or delay meals or snacks. You can be at risk for hypoglycemia if you are not getting enough carbohydrates. What lifestyle changes can be made?   Work closely with your health care provider to manage your blood glucose. Make sure you know: ? Your goal blood glucose levels. ? How and when to check your blood glucose. ? The symptoms of hypoglycemia. It is important to treat it right away to keep it from becoming severe.  Do not drink alcohol on an empty  stomach.  When you are ill, check your blood glucose more often than usual. Follow your sick day plan whenever you cannot eat or drink normally. Make this plan in advance with your health care provider.  Always check your blood glucose before, during, and after exercise. How is this treated? This condition can often be treated by immediately eating or drinking something that contains sugar, such as:  Fruit juice, 4-6 oz (120-150 mL).  Regular (not diet) soda, 4-6 oz (120-150 mL).  Low-fat milk, 4 oz (120 mL).  Several pieces of hard candy.  Sugar or honey, 1 Tbsp (15 mL). Treating hypoglycemia if you have diabetes If you are alert and able to swallow safely, follow the 15:15 rule:  Take 15 grams of a rapid-acting carbohydrate. Talk with your health care provider about how much you should take.  Rapid-acting options include: ? Glucose pills (take 15 grams). ? 6-8 pieces of hard candy. ? 4-6 oz (120-150 mL) of fruit juice. ? 4-6 oz (120-150 mL) of regular (not diet) soda.  Check your blood glucose 15 minutes after you take the carbohydrate.  If the repeat blood glucose level is still at or below 70 mg/dL (3.9 mmol/L), take 15 grams of a carbohydrate again.  If your blood glucose level does not increase above 70 mg/dL (3.9 mmol/L) after 3 tries, seek emergency medical care.  After your blood glucose level returns to normal, eat a meal or a snack within 1 hour. Treating severe hypoglycemia Severe hypoglycemia is when your blood glucose level is at or below 54 mg/dL (3 mmol/L). Severe hypoglycemia is a medical emergency. Get medical help right away. If you have severe hypoglycemia and you cannot eat or drink, you may need an injection of glucagon. A family member or close friend should learn how to check your blood glucose and how to give you a glucagon injection. Ask your health care provider if you need to have an emergency glucagon injection kit available. Severe hypoglycemia may  need to be treated in a hospital. The treatment may include getting glucose through an IV. You may also need treatment for the cause of your hypoglycemia. Where to find more information  American Diabetes Association: www.diabetes.[employer] of Diabetes and Digestive and Kidney Diseases: [employer] Contact a health care provider if:  You have problems keeping your blood glucose in your target range.  You have frequent episodes of hypoglycemia. Get help right away if:  You continue to have hypoglycemia symptoms after eating or drinking something containing glucose.  Your blood glucose level is at or below 54 mg/dL (3 mmol/L).  You faint.  You have a seizure. These symptoms may represent a serious problem that is an emergency. Do not wait to see if the symptoms will go away. Get medical help right away. Call your local emergency services (911 in the [place] Summary  Know the symptoms of hypoglycemia, and when you are at risk for it (such as during exercise or when you are sick). Check your blood glucose often when you are at risk for hypoglycemia.  Hypoglycemia can develop quickly, and it can be dangerous if it is not treated right away. If you have a history of severe hypoglycemia, make sure you know how to use your glucagon injection kit.  Make sure you know how to treat hypoglycemia. Keep a carbohydrate snack available when you may be at risk for hypoglycemia. This information is not intended to replace advice given to you by your health care provider. Make sure you discuss any questions you have with your health care provider. Document Revised: 01/30/2019 Document Reviewed: 06/05/2017 Elsevier Patient Education  2020 [place] Follow with Primary MD in 7 days   Get CBC, CMP, 2 view Chest X ray -  checked next visit within 1 week by Primary MD    Activity: As tolerated with Full fall precautions use walker/cane & assistance as needed  Disposition Home    Diet: Heart Healthy Low Carb  Accuchecks 4 times/day, Once in AM empty stomach and then before each meal. Log in all results and show them to your Prim.MD in 3 days. If any glucose reading is under 80 or above 300 call your Prim MD immidiately. Follow Low glucose instructions for glucose under 80 as instructed.  Special Instructions: If you have smoked or chewed Tobacco  in the last 2 yrs please stop smoking, stop any regular Alcohol  and or any Recreational drug use.  On your next visit with your primary care physician please Get Medicines reviewed and adjusted.  Please request your Prim.MD to go over all Hospital Tests and Procedure/Radiological results at the follow up, please get all Hospital records sent to your Prim MD by signing hospital release before you go home.  If you experience worsening of your admission symptoms, develop shortness of breath, life threatening emergency, suicidal or homicidal thoughts you must seek medical attention immediately by calling 911 or calling your MD immediately  if symptoms less severe.  You Must read complete instructions/literature along with all the possible adverse reactions/side effects for all the Medicines you take and that have been prescribed to you. Take any new Medicines after you have completely understood and accpet all the possible adverse reactions/side effects.

## 2020-02-05 NOTE — Progress Notes (Signed)
Inpatient Diabetes Program Recommendations  AACE/ADA: New Consensus Statement on Inpatient Glycemic Control (2015)  Target Ranges:  Prepandial:   less than 140 mg/dL      Peak postprandial:   less than 180 mg/dL (1-2 hours)      Critically ill patients:  140 - 180 mg/dL   Lab Results  Component Value Date   GLUCAP 318 (H) 02/05/2020   HGBA1C 8.8 (H) 02/03/2020    Review of Glycemic Control Results for ANDREJ, SPAGNOLI (MRN [id number]) as of 02/05/2020 09:39  Ref. Range 02/04/2020 06:18 02/04/2020 07:01 02/04/2020 07:35 02/04/2020 08:07 02/04/2020 11:00 02/04/2020 12:13 02/04/2020 14:13 02/04/2020 17:31 02/04/2020 21:18 02/05/2020 07:28  Glucose-Capillary Latest Ref Range: 70 - 99 mg/dL 840 (HH) 375 (H) 436 (H) 189 (H) 75 73 109 (H) 180 (H) 228 (H) 318 (H)    Diabetes history:DM type 1 Outpatient Diabetes medications:70/30 32 units bid Current orders for Inpatient glycemic control:IV insulin transition to: Novolog 0-9 units TID, Novolog 0-5 units QHS Novolog 70/30 25 units BID  Inpatient Diabetes Program Recommendations:  Fasting glucose elevated this am.  May consider increasing pm dose only of 70/30 back up to 30 units.    Thanks, Christena Deem RN, MSN, BC-ADM Inpatient Diabetes Coordinator Team Pager [phone] (8a-5p)

## 2020-02-05 NOTE — TOC Transition Note (Addendum)
Transition of Care [facility]) - CM/SW Discharge Note   Patient Details  Name: Mark [name] MRN: [id number] Date of Birth: 07-Oct-1986  [facility] CM/SW Contact:  Cherylann Parr, RN Phone Number: 02/05/2020, 1:10 PM   Clinical Narrative:   Pt requests a bus pass to get home today, however pt told CM that he has the funds to utilize the city bus in the community.  CM spoke with attending about pt not being able to refrigerate insulin - consideration will be taken into account for discharge medicaitons.  CSW will provide bus pass, [facility] and food pantry resources.   CM discussed case with TOC supervisor - referral made to [employer] however pt not accepted because he isn't homeless.      CM confirmed that pt was no show for [facility] 3/29 (set up on last admit).  Pt received his medications free of charge/MATCH on last admit.   Pt in agreement for CM to make [facility] referral.    CM contacted Recardo Evangelist with [phone] to determine if referral can be made for pt without a working telephone number - CM made referral and listed address.  CM made another appt with [facility] and informed pt of specifics.    Pt received MATCH this admit - TOC in agreement for override secondary to previous utilization of this benefit  CM reached out to [employer] (per Beckey Downing with [facility]) with Architect, [name]. Resource brought up to speed on pts needs. Resource will pick up a food box from [employer] and drop it off at the address verfied from  - pt in agreement.  [employer] information placed on AVS. Resource will also assist pt with other community resources as available.    Pt denied all concerns with discharge back home.  CM signing off           Patient Goals and CMS Choice Patient states their goals for this hospitalization and ongoing recovery are:: Pt did not state a specific goal CMS Medicare.gov Compare Post Acute Care list provided  to:: Patient Choice offered to / list presented to : Patient  Discharge Placement                       Discharge Plan and Services                                     Social Determinants of Health (SDOH) Interventions     Readmission Risk Interventions No flowsheet data found.

## 2020-02-11 ENCOUNTER — Ambulatory Visit (INDEPENDENT_AMBULATORY_CARE_PROVIDER_SITE_OTHER): Payer: Self-pay | Admitting: Primary Care

## 2020-02-16 LAB — GLUCOSE, CAPILLARY
Glucose-Capillary: >600 mg/dL (ref 70–99)
Glucose-Capillary: 36 mg/dL — CL (ref 70–99)

## 2020-03-04 ENCOUNTER — Ambulatory Visit (HOSPITAL_COMMUNITY)
Admission: AD | Admit: 2020-03-04 | Discharge: 2020-03-04 | Disposition: A | Discharge status: Home / Self Care | Payer: Self-pay | Attending: Psychiatry | Admitting: Psychiatry

## 2020-03-04 DIAGNOSIS — R45851 Suicidal ideations: Secondary | ICD-10-CM | POA: Insufficient documentation

## 2020-03-04 DIAGNOSIS — G479 Sleep disorder, unspecified: Secondary | ICD-10-CM | POA: Insufficient documentation

## 2020-03-04 DIAGNOSIS — F329 Major depressive disorder, single episode, unspecified: Secondary | ICD-10-CM | POA: Insufficient documentation

## 2020-03-04 DIAGNOSIS — R450 Nervousness: Secondary | ICD-10-CM | POA: Insufficient documentation

## 2020-03-04 DIAGNOSIS — F419 Anxiety disorder, unspecified: Secondary | ICD-10-CM | POA: Insufficient documentation

## 2020-03-04 NOTE — H&P (Signed)
Behavioral Health Medical Screening Exam  Mark [name] is an 34 y.o. male who presented to [facility] voluntarily with law enforcement due to worsening depression and suicidal thoughts. Patient reports that about 20 minutes prior to arrival he took 80 units of 70/30 insulin in a suicide attempt. Patient states that he also took Lantuss 30 units, which is his scheduled nightly dose. At this time patient is alert and oriented x 4. Speech is clear and coherent. Skin is warm and dry. He denies any dizziness, anxiety, visual changes, nausea. Patient brought a 2 liter bottle of Dr. Reino Kent with him "because I knew my blood sugar would drop."  [facility] notified EMS.   Total Time spent with patient: 15 minutes  Psychiatric Specialty Exam: Physical Exam  Constitutional: He appears well-developed and well-nourished. No distress.  HENT:  Head: Normocephalic and atraumatic.  Right Ear: External ear normal.  Left Ear: External ear normal.  Eyes: Pupils are equal, round, and reactive to light. Right eye exhibits no discharge. Left eye exhibits no discharge.  Respiratory: Effort normal. No respiratory distress.  Musculoskeletal:        General: Normal range of motion.  Neurological: He is alert.  Skin: Skin is warm and dry. He is not diaphoretic.  Psychiatric: He exhibits a depressed mood. He expresses suicidal ideation. He expresses suicidal plans.    Review of Systems  Constitutional: Negative for activity change, appetite change, chills, diaphoresis, fatigue, fever and unexpected weight change.  Respiratory: Negative for shortness of breath.   Cardiovascular: Negative for chest pain and palpitations.  Gastrointestinal: Negative for diarrhea and nausea.  Neurological: Negative for dizziness.  Psychiatric/Behavioral: Positive for dysphoric mood, sleep disturbance and suicidal ideas. The patient is nervous/anxious.   All other systems reviewed and are negative.   There were no vitals taken for this visit.There  is no height or weight on file to calculate BMI.  General Appearance: Disheveled  Eye Contact:  Good  Speech:  Clear and Coherent and Normal Rate  Volume:  Normal  Mood:  Depressed, Hopeless and Worthless    Musculoskeletal: Strength & Muscle Tone: within normal limits Gait & Station: normal Patient leans: N/A  There were no vitals taken for this visit.  Recommendations:  Based on my evaluation the patient does not appear to have an emergency medical condition.  Jackelyn Poling, NP 03/04/2020, 11:46 PM

## 2020-03-05 ENCOUNTER — Inpatient Hospital Stay (HOSPITAL_COMMUNITY)
Admission: EM | Admit: 2020-03-05 | Discharge: 2020-03-13 | DRG: 918 | Disposition: A | Discharge status: Other Acute Inpatient Hospital | Payer: Self-pay | Source: Ambulatory Visit | Attending: Internal Medicine | Admitting: Internal Medicine

## 2020-03-05 ENCOUNTER — Encounter (HOSPITAL_COMMUNITY): Payer: Self-pay

## 2020-03-05 ENCOUNTER — Other Ambulatory Visit: Payer: Self-pay

## 2020-03-05 DIAGNOSIS — F17200 Nicotine dependence, unspecified, uncomplicated: Secondary | ICD-10-CM | POA: Diagnosis present

## 2020-03-05 DIAGNOSIS — E876 Hypokalemia: Secondary | ICD-10-CM | POA: Diagnosis present

## 2020-03-05 DIAGNOSIS — E162 Hypoglycemia, unspecified: Secondary | ICD-10-CM

## 2020-03-05 DIAGNOSIS — I619 Nontraumatic intracerebral hemorrhage, unspecified: Secondary | ICD-10-CM

## 2020-03-05 DIAGNOSIS — F609 Personality disorder, unspecified: Secondary | ICD-10-CM | POA: Diagnosis present

## 2020-03-05 DIAGNOSIS — E10649 Type 1 diabetes mellitus with hypoglycemia without coma: Secondary | ICD-10-CM | POA: Diagnosis present

## 2020-03-05 DIAGNOSIS — F1910 Other psychoactive substance abuse, uncomplicated: Secondary | ICD-10-CM | POA: Diagnosis present

## 2020-03-05 DIAGNOSIS — F321 Major depressive disorder, single episode, moderate: Secondary | ICD-10-CM

## 2020-03-05 DIAGNOSIS — T1491XA Suicide attempt, initial encounter: Secondary | ICD-10-CM | POA: Diagnosis present

## 2020-03-05 DIAGNOSIS — D649 Anemia, unspecified: Secondary | ICD-10-CM | POA: Diagnosis present

## 2020-03-05 DIAGNOSIS — G9340 Encephalopathy, unspecified: Secondary | ICD-10-CM

## 2020-03-05 DIAGNOSIS — E785 Hyperlipidemia, unspecified: Secondary | ICD-10-CM | POA: Diagnosis present

## 2020-03-05 DIAGNOSIS — T50901A Poisoning by unspecified drugs, medicaments and biological substances, accidental (unintentional), initial encounter: Secondary | ICD-10-CM | POA: Diagnosis present

## 2020-03-05 DIAGNOSIS — F333 Major depressive disorder, recurrent, severe with psychotic symptoms: Secondary | ICD-10-CM | POA: Diagnosis present

## 2020-03-05 DIAGNOSIS — E1069 Type 1 diabetes mellitus with other specified complication: Secondary | ICD-10-CM | POA: Diagnosis present

## 2020-03-05 DIAGNOSIS — Z79899 Other long term (current) drug therapy: Secondary | ICD-10-CM

## 2020-03-05 DIAGNOSIS — Z882 Allergy status to sulfonamides status: Secondary | ICD-10-CM

## 2020-03-05 DIAGNOSIS — Z9119 Patient's noncompliance with other medical treatment and regimen: Secondary | ICD-10-CM

## 2020-03-05 DIAGNOSIS — Z915 Personal history of self-harm: Secondary | ICD-10-CM

## 2020-03-05 DIAGNOSIS — F314 Bipolar disorder, current episode depressed, severe, without psychotic features: Secondary | ICD-10-CM | POA: Diagnosis present

## 2020-03-05 DIAGNOSIS — Z794 Long term (current) use of insulin: Secondary | ICD-10-CM

## 2020-03-05 DIAGNOSIS — F1210 Cannabis abuse, uncomplicated: Secondary | ICD-10-CM | POA: Diagnosis present

## 2020-03-05 DIAGNOSIS — R45851 Suicidal ideations: Secondary | ICD-10-CM

## 2020-03-05 DIAGNOSIS — T383X1A Poisoning by insulin and oral hypoglycemic [antidiabetic] drugs, accidental (unintentional), initial encounter: Secondary | ICD-10-CM | POA: Insufficient documentation

## 2020-03-05 DIAGNOSIS — Z79890 Hormone replacement therapy (postmenopausal): Secondary | ICD-10-CM

## 2020-03-05 DIAGNOSIS — F315 Bipolar disorder, current episode depressed, severe, with psychotic features: Secondary | ICD-10-CM | POA: Diagnosis present

## 2020-03-05 DIAGNOSIS — T383X2A Poisoning by insulin and oral hypoglycemic [antidiabetic] drugs, intentional self-harm, initial encounter: Principal | ICD-10-CM | POA: Diagnosis present

## 2020-03-05 DIAGNOSIS — E038 Other specified hypothyroidism: Secondary | ICD-10-CM

## 2020-03-05 DIAGNOSIS — Z881 Allergy status to other antibiotic agents status: Secondary | ICD-10-CM

## 2020-03-05 DIAGNOSIS — F17210 Nicotine dependence, cigarettes, uncomplicated: Secondary | ICD-10-CM | POA: Diagnosis present

## 2020-03-05 DIAGNOSIS — T50902A Poisoning by unspecified drugs, medicaments and biological substances, intentional self-harm, initial encounter: Secondary | ICD-10-CM | POA: Diagnosis present

## 2020-03-05 DIAGNOSIS — E1042 Type 1 diabetes mellitus with diabetic polyneuropathy: Secondary | ICD-10-CM | POA: Diagnosis present

## 2020-03-05 DIAGNOSIS — Z20822 Contact with and (suspected) exposure to covid-19: Secondary | ICD-10-CM | POA: Diagnosis present

## 2020-03-05 DIAGNOSIS — E1065 Type 1 diabetes mellitus with hyperglycemia: Secondary | ICD-10-CM | POA: Diagnosis not present

## 2020-03-05 DIAGNOSIS — Z87442 Personal history of urinary calculi: Secondary | ICD-10-CM

## 2020-03-05 DIAGNOSIS — Z91199 Patient's noncompliance with other medical treatment and regimen due to unspecified reason: Secondary | ICD-10-CM

## 2020-03-05 DIAGNOSIS — G40909 Epilepsy, unspecified, not intractable, without status epilepticus: Secondary | ICD-10-CM | POA: Diagnosis present

## 2020-03-05 DIAGNOSIS — F329 Major depressive disorder, single episode, unspecified: Secondary | ICD-10-CM | POA: Diagnosis present

## 2020-03-05 DIAGNOSIS — T383X5A Adverse effect of insulin and oral hypoglycemic [antidiabetic] drugs, initial encounter: Secondary | ICD-10-CM | POA: Diagnosis present

## 2020-03-05 DIAGNOSIS — Z880 Allergy status to penicillin: Secondary | ICD-10-CM

## 2020-03-05 DIAGNOSIS — E039 Hypothyroidism, unspecified: Secondary | ICD-10-CM | POA: Diagnosis present

## 2020-03-05 DIAGNOSIS — Z803 Family history of malignant neoplasm of breast: Secondary | ICD-10-CM

## 2020-03-05 LAB — CBC WITH DIFFERENTIAL/PLATELET
Abs Immature Granulocytes: 0.02 10*3/uL (ref 0.00–0.07)
Abs Immature Granulocytes: 0.02 10*3/uL (ref 0.00–0.07)
Basophils Absolute: 0 10*3/uL (ref 0.0–0.1)
Basophils Absolute: 0.1 10*3/uL (ref 0.0–0.1)
Basophils Relative: 1 %
Basophils Relative: 1 %
Eosinophils Absolute: 0.3 10*3/uL (ref 0.0–0.5)
Eosinophils Absolute: 0.3 10*3/uL (ref 0.0–0.5)
Eosinophils Relative: 4 %
Eosinophils Relative: 4 %
HCT: 37.7 % — ABNORMAL LOW (ref 39.0–52.0)
HCT: 35.7 % — ABNORMAL LOW (ref 39.0–52.0)
Hemoglobin: 11.8 g/dL — ABNORMAL LOW (ref 13.0–17.0)
Hemoglobin: 11.5 g/dL — ABNORMAL LOW (ref 13.0–17.0)
Immature Granulocytes: 0 %
Immature Granulocytes: 0 %
Lymphocytes Relative: 37 %
Lymphocytes Relative: 42 %
Lymphs Abs: 2.6 10*3/uL (ref 0.7–4.0)
Lymphs Abs: 3 10*3/uL (ref 0.7–4.0)
MCH: 29.4 pg (ref 26.0–34.0)
MCH: 29.9 pg (ref 26.0–34.0)
MCHC: 31.3 g/dL (ref 30.0–36.0)
MCHC: 32.2 g/dL (ref 30.0–36.0)
MCV: 94 fL (ref 80.0–100.0)
MCV: 92.7 fL (ref 80.0–100.0)
Monocytes Absolute: 0.7 10*3/uL (ref 0.1–1.0)
Monocytes Absolute: 0.6 10*3/uL (ref 0.1–1.0)
Monocytes Relative: 9 %
Monocytes Relative: 9 %
Neutro Abs: 3.5 10*3/uL (ref 1.7–7.7)
Neutro Abs: 3.1 10*3/uL (ref 1.7–7.7)
Neutrophils Relative %: 49 %
Neutrophils Relative %: 44 %
Platelets: 258 10*3/uL (ref 150–400)
Platelets: 275 10*3/uL (ref 150–400)
RBC: 4.01 MIL/uL — ABNORMAL LOW (ref 4.22–5.81)
RBC: 3.85 MIL/uL — ABNORMAL LOW (ref 4.22–5.81)
RDW: 15.6 % — ABNORMAL HIGH (ref 11.5–15.5)
RDW: 15.6 % — ABNORMAL HIGH (ref 11.5–15.5)
WBC: 7.2 10*3/uL (ref 4.0–10.5)
WBC: 7.2 10*3/uL (ref 4.0–10.5)
nRBC: 0 % (ref 0.0–0.2)
nRBC: 0 % (ref 0.0–0.2)

## 2020-03-05 LAB — COMPREHENSIVE METABOLIC PANEL
ALT: 47 U/L — ABNORMAL HIGH (ref 0–44)
AST: 65 U/L — ABNORMAL HIGH (ref 15–41)
Albumin: 3.9 g/dL (ref 3.5–5.0)
Alkaline Phosphatase: 88 U/L (ref 38–126)
Anion gap: 5 (ref 5–15)
BUN: <5 mg/dL — ABNORMAL LOW (ref 6–20)
CO2: 26 mmol/L (ref 22–32)
Calcium: 9 mg/dL (ref 8.9–10.3)
Chloride: 110 mmol/L (ref 98–111)
Creatinine, Ser: 0.78 mg/dL (ref 0.61–1.24)
GFR calc Af Amer: >60 mL/min (ref >60)
GFR calc non Af Amer: >60 mL/min (ref >60)
Glucose, Bld: 37 mg/dL — CL (ref 70–99)
Potassium: 3.2 mmol/L — ABNORMAL LOW (ref 3.5–5.1)
Sodium: 141 mmol/L (ref 135–145)
Total Bilirubin: 0.4 mg/dL (ref 0.3–1.2)
Total Protein: 6.7 g/dL (ref 6.5–8.1)

## 2020-03-05 LAB — HEPATIC FUNCTION PANEL
ALT: 41 U/L (ref 0–44)
AST: 53 U/L — ABNORMAL HIGH (ref 15–41)
Albumin: 3.4 g/dL — ABNORMAL LOW (ref 3.5–5.0)
Alkaline Phosphatase: 83 U/L (ref 38–126)
Bilirubin, Direct: <0.1 mg/dL (ref 0.0–0.2)
Total Bilirubin: 0.4 mg/dL (ref 0.3–1.2)
Total Protein: 6.1 g/dL — ABNORMAL LOW (ref 6.5–8.1)

## 2020-03-05 LAB — CBG MONITORING, ED
Glucose-Capillary: 100 mg/dL — ABNORMAL HIGH (ref 70–99)
Glucose-Capillary: 126 mg/dL — ABNORMAL HIGH (ref 70–99)
Glucose-Capillary: 127 mg/dL — ABNORMAL HIGH (ref 70–99)
Glucose-Capillary: 133 mg/dL — ABNORMAL HIGH (ref 70–99)
Glucose-Capillary: 148 mg/dL — ABNORMAL HIGH (ref 70–99)
Glucose-Capillary: 161 mg/dL — ABNORMAL HIGH (ref 70–99)
Glucose-Capillary: 187 mg/dL — ABNORMAL HIGH (ref 70–99)
Glucose-Capillary: 224 mg/dL — ABNORMAL HIGH (ref 70–99)
Glucose-Capillary: 263 mg/dL — ABNORMAL HIGH (ref 70–99)
Glucose-Capillary: 27 mg/dL — CL (ref 70–99)
Glucose-Capillary: 34 mg/dL — CL (ref 70–99)
Glucose-Capillary: 40 mg/dL — CL (ref 70–99)
Glucose-Capillary: 48 mg/dL — ABNORMAL LOW (ref 70–99)
Glucose-Capillary: 49 mg/dL — ABNORMAL LOW (ref 70–99)
Glucose-Capillary: 50 mg/dL — ABNORMAL LOW (ref 70–99)
Glucose-Capillary: 51 mg/dL — ABNORMAL LOW (ref 70–99)
Glucose-Capillary: 55 mg/dL — ABNORMAL LOW (ref 70–99)
Glucose-Capillary: 59 mg/dL — ABNORMAL LOW (ref 70–99)
Glucose-Capillary: 60 mg/dL — ABNORMAL LOW (ref 70–99)
Glucose-Capillary: 60 mg/dL — ABNORMAL LOW (ref 70–99)
Glucose-Capillary: 61 mg/dL — ABNORMAL LOW (ref 70–99)
Glucose-Capillary: 65 mg/dL — ABNORMAL LOW (ref 70–99)
Glucose-Capillary: 73 mg/dL (ref 70–99)
Glucose-Capillary: 78 mg/dL (ref 70–99)
Glucose-Capillary: 78 mg/dL (ref 70–99)
Glucose-Capillary: 81 mg/dL (ref 70–99)
Glucose-Capillary: 83 mg/dL (ref 70–99)
Glucose-Capillary: 84 mg/dL (ref 70–99)
Glucose-Capillary: 86 mg/dL (ref 70–99)
Glucose-Capillary: 98 mg/dL (ref 70–99)

## 2020-03-05 LAB — MAGNESIUM
Magnesium: 1.7 mg/dL (ref 1.7–2.4)
Magnesium: 1.8 mg/dL (ref 1.7–2.4)

## 2020-03-05 LAB — BASIC METABOLIC PANEL
Anion gap: 7 (ref 5–15)
BUN: 5 mg/dL — ABNORMAL LOW (ref 6–20)
CO2: 24 mmol/L (ref 22–32)
Calcium: 9 mg/dL (ref 8.9–10.3)
Chloride: 109 mmol/L (ref 98–111)
Creatinine, Ser: 0.78 mg/dL (ref 0.61–1.24)
GFR calc Af Amer: >60 mL/min (ref >60)
GFR calc non Af Amer: >60 mL/min (ref >60)
Glucose, Bld: 69 mg/dL — ABNORMAL LOW (ref 70–99)
Potassium: 3.1 mmol/L — ABNORMAL LOW (ref 3.5–5.1)
Sodium: 140 mmol/L (ref 135–145)

## 2020-03-05 LAB — RAPID URINE DRUG SCREEN, HOSP PERFORMED
Amphetamines: NOT DETECTED
Barbiturates: NOT DETECTED
Benzodiazepines: NOT DETECTED
Cocaine: NOT DETECTED
Opiates: NOT DETECTED
Tetrahydrocannabinol: POSITIVE — AB

## 2020-03-05 LAB — GLUCOSE, CAPILLARY: Glucose-Capillary: 76 mg/dL (ref 70–99)

## 2020-03-05 LAB — LIPID PANEL
Cholesterol: 214 mg/dL — ABNORMAL HIGH (ref 0–200)
HDL: 45 mg/dL (ref >40)
LDL Cholesterol: 153 mg/dL — ABNORMAL HIGH (ref 0–99)
Total CHOL/HDL Ratio: 4.8 RATIO
Triglycerides: 81 mg/dL (ref <150)
VLDL: 16 mg/dL (ref 0–40)

## 2020-03-05 LAB — SALICYLATE LEVEL
Salicylate Lvl: <7 mg/dL — ABNORMAL LOW (ref 7.0–30.0)
Salicylate Lvl: <7 mg/dL — ABNORMAL LOW (ref 7.0–30.0)

## 2020-03-05 LAB — POTASSIUM: Potassium: 4.2 mmol/L (ref 3.5–5.1)

## 2020-03-05 LAB — VALPROIC ACID LEVEL: Valproic Acid Lvl: <10 ug/mL — ABNORMAL LOW (ref 50.0–100.0)

## 2020-03-05 LAB — ETHANOL: Alcohol, Ethyl (B): <10 mg/dL (ref <10)

## 2020-03-05 LAB — ACETAMINOPHEN LEVEL
Acetaminophen (Tylenol), Serum: <10 ug/mL — ABNORMAL LOW (ref 10–30)
Acetaminophen (Tylenol), Serum: <10 ug/mL — ABNORMAL LOW (ref 10–30)

## 2020-03-05 LAB — SARS CORONAVIRUS 2 (TAT 6-24 HRS): SARS Coronavirus 2: NEGATIVE

## 2020-03-05 LAB — TSH: TSH: 18.433 u[IU]/mL — ABNORMAL HIGH (ref 0.350–4.500)

## 2020-03-05 LAB — T4, FREE: Free T4: 0.6 ng/dL — ABNORMAL LOW (ref 0.61–1.12)

## 2020-03-05 MED ORDER — QUETIAPINE FUMARATE 100 MG PO TABS
100.0000 mg | ORAL_TABLET | Freq: Every day | ORAL | Status: DC
  Administered 2020-03-05 – 2020-03-12 (×8): 100 mg via ORAL
  Filled 2020-03-05 (×8): qty 1

## 2020-03-05 MED ORDER — DEXTROSE 20 % IV SOLN
INTRAVENOUS | Status: DC
  Filled 2020-03-05 (×6): qty 500

## 2020-03-05 MED ORDER — DEXTROSE 10 % IV SOLN: INTRAVENOUS | Status: DC

## 2020-03-05 MED ORDER — TRAZODONE HCL 50 MG PO TABS
50.0000 mg | ORAL_TABLET | Freq: Every day | ORAL | Status: DC
  Administered 2020-03-05 – 2020-03-12 (×8): 50 mg via ORAL
  Filled 2020-03-05 (×8): qty 1

## 2020-03-05 MED ORDER — DIVALPROEX SODIUM 250 MG PO DR TAB
500.0000 mg | DELAYED_RELEASE_TABLET | Freq: Two times a day (BID) | ORAL | Status: DC
  Administered 2020-03-05 – 2020-03-13 (×16): 500 mg via ORAL
  Filled 2020-03-05: qty 2
  Filled 2020-03-05 (×2): qty 1
  Filled 2020-03-05 (×13): qty 2

## 2020-03-05 MED ORDER — ENOXAPARIN SODIUM 60 MG/0.6ML ~~LOC~~ SOLN
0.5000 mg/kg | SUBCUTANEOUS | Status: DC
  Administered 2020-03-06 – 2020-03-12 (×7): 55 mg via SUBCUTANEOUS
  Filled 2020-03-05: qty 0.55
  Filled 2020-03-05 (×7): qty 0.6

## 2020-03-05 MED ORDER — POTASSIUM CHLORIDE 10 MEQ/100ML IV SOLN
10.0000 meq | INTRAVENOUS | Status: AC
  Administered 2020-03-05 (×6): 10 meq via INTRAVENOUS
  Filled 2020-03-05 (×6): qty 100

## 2020-03-05 MED ORDER — MAGNESIUM SULFATE 2 GM/50ML IV SOLN
2.0000 g | Freq: Once | INTRAVENOUS | Status: AC
  Administered 2020-03-05: 2 g via INTRAVENOUS
  Filled 2020-03-05: qty 50

## 2020-03-05 MED ORDER — INSULIN ASPART 100 UNIT/ML ~~LOC~~ SOLN
0.0000 [IU] | SUBCUTANEOUS | Status: DC
  Filled 2020-03-05: qty 0.15

## 2020-03-05 MED ORDER — DEXTROSE 50 % IV SOLN
INTRAVENOUS | Status: AC
  Filled 2020-03-05: qty 50

## 2020-03-05 MED ORDER — INSULIN ASPART 100 UNIT/ML ~~LOC~~ SOLN
0.0000 [IU] | SUBCUTANEOUS | Status: DC
  Administered 2020-03-05: 5 [IU] via SUBCUTANEOUS
  Filled 2020-03-05: qty 0.09

## 2020-03-05 MED ORDER — DEXTROSE 50 % IV SOLN
1.0000 | Freq: Once | INTRAVENOUS | Status: AC
  Administered 2020-03-05: 50 mL via INTRAVENOUS

## 2020-03-05 MED ORDER — ESCITALOPRAM OXALATE 10 MG PO TABS
10.0000 mg | ORAL_TABLET | Freq: Every day | ORAL | Status: DC
  Administered 2020-03-06 – 2020-03-13 (×8): 10 mg via ORAL
  Filled 2020-03-05 (×8): qty 1

## 2020-03-05 MED ORDER — DEXTROSE 50 % IV SOLN
50.0000 mL | Freq: Every day | INTRAVENOUS | Status: DC | PRN
  Administered 2020-03-05 (×3): 50 mL via INTRAVENOUS
  Filled 2020-03-05 (×2): qty 50

## 2020-03-05 NOTE — ED Notes (Signed)
Dr. Bebe Shaggy IVC'd this patient 03/05/20@ 6:00am, Diplomatic Services operational officer completed paperwork and faxed to Magistrate. Magistrate Williams faxed back findings and custody@ 7:29am. Secretary called [employer] and [employer] served the patient @ 8:23am.

## 2020-03-05 NOTE — Progress Notes (Signed)
PROGRESS NOTE    Mark [name]  LZJ:[id number] DOB: 02-22-1986 DOA: 03/05/2020 PCP: Patient, No Pcp Per     Brief Narrative:  Mark [name] is a 34 y.o. WM PMHx DM Type 1 uncontrolled with complication,Bipolar disorder was admitted last month for DKA, drug abuse  intentionally overdosed himself with his NovoLog 70/30 which he usually takes 30 units 3 times daily with taking 80 units along with Levemir 30 units somewhere around after his dinner tonight.  Patient states he was depressed and wanted to kill himself and thus why he did this but after doing this he called the police and patient was brought here.  Denies taking any other medications.  Patient has not been taking any of his psychiatric medication for last 1 month.  ED Course: In the ER patient blood sugar initially in the 60s dropped down to around 27 he had to be started on D10 and D50 ampules Poison control at this time recommended supportive care with D10 and D50 as necessary.  Patient's acetaminophen level salicylate level valproic acid levels were negative.  Labs show creatinine 1.7 AST 65 ALT 47 hemoglobin 11.8 platelets 258 EKG normal sinus rhythm with acceptable QTC.  Covid test is pending.   Subjective: A/O x4, states wants to kill himself.  Does not want to harm anyone else.  States he stopped taking his Synthroid months ago because he could not afford it.  Negative CP, negative S OB, negative abdominal pain.   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 Mark [name])   Noncompliance   Suicidal ideation   Bipolar I disorder, most recent episode depressed, severe without psychotic features (HCC)   Marijuana abuse   Tobacco dependence   Seizure disorder (HCC)   Uncontrolled type 1 diabetes mellitus with hypoglycemia without coma, with  long-term current use of insulin (HCC)   Hypothyroidism   Diabetic polyneuropathy associated with type 1 diabetes mellitus (HCC)   Drug overdose   Drug abuse (HCC)  Suicide attempt/intentional overdose -Intentional overdose with insulin -5/15 psychiatry consulted -Since patient's blood sugar despite being on D10 150 cc/h patient has been becoming hypoglycemic so we are going to start the 20 and as needed D50 ampules.  Check CBGs closely.  Bipolar disorder  Hypokalemia -Potassium goal> 4 -Potassium IV 60 mEq  Hypomagnesmia -Magnesium goal> 2 -Magnesium IV 2 g -Recheck K/Mg @1500 ; WNL  DM type I uncontrolled with complication -4/14 hemoglobin A1c= 8.8 -Lipid panel pending -Sensitive SSI  Hypoglycemia -D 20 at 171ml/hr  Dr ug abuse  -5/15 urine tox screen positive marijuana - Hypothyroidism -TSH 18.4 -5/15 free T3/T4 pending  1. -Mildly elevated LFTs cause not clear patient denies taking any Tylenol.  Will repeat Tylenol level and follow LFTs and check acute hepatitis panel with next blood draw. 2. Hypothyroidism has not been taking his medicines for last 1 month check TSH.  Restart Synthroid based on which. 3. Anemia follow CBC. 4. Mild hypokalemia for which her poison control advised not to overtly correct since patient has elevated insulin levels now due to overdose may become hyperkalemic once insulin levels code on.    DVT prophylaxis: Lovenox Code Status: Full Family Communication:  Disposition Plan:  Status is: Inpatient    Dispo: The patient is from: Home  Anticipated d/c is to: Home              Anticipated d/c date is: May 25              Patient currently unstable      Consultants:    Procedures/Significant Events:    I have personally reviewed and interpreted all radiology studies and my findings are as above.  VENTILATOR SETTINGS:    Cultures   Antimicrobials:    Devices    LINES / TUBES:      Continuous  Infusions: . dextrose 100 mL/hr at 03/05/20 0532  . magnesium sulfate bolus IVPB    . potassium chloride 10 mEq (03/05/20 0801)     Objective: Vitals:   03/05/20 0434 03/05/20 0530 03/05/20 0657 03/05/20 0800  BP: 121/68  (!) 136/97 129/79  Pulse: 76 82 90 76  Resp: 16 14 16 16   Temp:      SpO2: 100% 98% 99% 98%  Weight:      Height:       No intake or output data in the 24 hours ending 03/05/20 0829 Filed Weights   03/05/20 0019  Weight: 113.4 kg    Examination:  General: A/O x4, no acute respiratory distress Eyes: negative scleral hemorrhage, negative anisocoria, negative icterus ENT: Negative Runny nose, negative gingival bleeding, EXTREMELY poor dentation Neck:  Negative scars, masses, torticollis, lymphadenopathy, JVD Lungs: Clear to auscultation bilaterally without wheezes or crackles Cardiovascular: Regular rate and rhythm without murmur gallop or rub normal S1 and S2 Abdomen: negative abdominal pain, nondistended, positive soft, bowel sounds, no rebound, no ascites, no appreciable mass Extremities: No significant cyanosis, clubbing, or edema bilateral lower extremities Skin: Negative rashes, lesions, ulcers Psychiatric: Positive depression, positive anxiety, negative fatigue, negative mania  Central nervous system:  Cranial nerves II through XII intact, tongue/uvula midline, all extremities muscle strength 5/5, sensation intact throughout, negative dysarthria, negative expressive aphasia, negative receptive aphasia.  .     Data Reviewed: Care during the described time interval was provided by me .  I have reviewed this patient's available data, including medical history, events of note, physical examination, and all test results as part of my evaluation.  CBC: Recent Labs  Lab 03/05/20 0058 03/05/20 0457  WBC 7.2 7.2  NEUTROABS 3.5 3.1  HGB 11.8* 11.5*  HCT 37.7* 35.7*  MCV 94.0 92.7  PLT 258 914   Basic Metabolic Panel: Recent Labs  Lab  03/05/20 0058 03/05/20 0457  NA 141 140  K 3.2* 3.1*  CL 110 109  CO2 26 24  GLUCOSE 37* 69*  BUN <5* 5*  CREATININE 0.78 0.78  CALCIUM 9.0 9.0  MG  --  1.7   GFR: Estimated Creatinine Clearance: 171 mL/min (by C-G formula based on SCr of 0.78 mg/dL). Liver Function Tests: Recent Labs  Lab 03/05/20 0058 03/05/20 0457  AST 65* 53*  ALT 47* 41  ALKPHOS 88 83  BILITOT 0.4 0.4  PROT 6.7 6.1*  ALBUMIN 3.9 3.4*   No results for input(s): LIPASE, AMYLASE in the last 168 hours. No results for input(s): AMMONIA in the last 168 hours. Coagulation Profile: No results for input(s): INR, PROTIME in the last 168 hours. Cardiac Enzymes: No results for input(s): CKTOTAL, CKMB, CKMBINDEX, TROPONINI in the last 168 hours. BNP (last 3 results) No results for input(s): PROBNP in the last 8760 hours. HbA1C: No results for input(s): HGBA1C in the last 72 hours. CBG: Recent Labs  Lab 03/05/20 0508 03/05/20 0557  03/05/20 0637 03/05/20 0659 03/05/20 0803  GLUCAP 60* 40* 34* 55* 61*   Lipid Profile: No results for input(s): CHOL, HDL, LDLCALC, TRIG, CHOLHDL, LDLDIRECT in the last 72 hours. Thyroid Function Tests: Recent Labs    03/05/20 0457  TSH 18.433*   Anemia Panel: No results for input(s): VITAMINB12, FOLATE, FERRITIN, TIBC, IRON, RETICCTPCT in the last 72 hours. Sepsis Labs: No results for input(s): PROCALCITON, LATICACIDVEN in the last 168 hours.  No results found for this or any previous visit (from the past 240 hour(s)).       Radiology Studies: No results found.      Scheduled Meds: Continuous Infusions: . dextrose 100 mL/hr at 03/05/20 0532  . magnesium sulfate bolus IVPB    . potassium chloride 10 mEq (03/05/20 0801)     LOS: 0 days    Time spent:40 min    Lakeisha Waldrop, Roselind Messier, MD [facility] Pager [phone]  If 7PM-7AM, please contact night-coverage [email or web address] Password [name] 03/05/2020, 8:29 AM

## 2020-03-05 NOTE — ED Notes (Signed)
Writer spoke with Rogue Bussing from poison control. Provided updated vitals, gave update on infusions, depakote level. Rose stated to contact poison control if needed for any further questions/guidance.

## 2020-03-05 NOTE — ED Provider Notes (Signed)
Patient became agitated wanting to leave.  IVC has been completed He is eating a sandwich and is drinking soda at this time   Mark Rhine, MD 03/05/20 [phone]

## 2020-03-05 NOTE — ED Provider Notes (Signed)
[facility] DEPT Provider Note   CSN: [id number] Arrival date & time: 03/05/20  0010     History Chief Complaint - overdose  Mark [name] is a 34 y.o. male.  The history is provided by the patient.  Drug Overdose This is a new problem. The current episode started 1 to 2 hours ago. The problem occurs constantly. The problem has been rapidly worsening. Associated symptoms include headaches. Pertinent negatives include no chest pain, no abdominal pain and no shortness of breath. Associated symptoms comments: Chronic headache. Nothing aggravates the symptoms. Nothing relieves the symptoms.   Patient with history of bipolar, diabetes presents after an overdose.  Patient reports he took an intentional overdose of insulin.  Patient reports he was trying to harm himself.  Reports he took up to 80 units of 70/30 insulin, as well as 30 units of Levemir No fevers or vomiting.  He reports chronic headaches He admits to alcohol use as well    Past Medical History:  Diagnosis Date  . Bipolar disorder ([place])   . Depression   . Diabetes mellitus without complication ([place])   . Diabetic polyneuropathy associated with type 1 diabetes mellitus ([place])   . Hydrocephalus ([place])   . Kidney stones   . Seizures [facility])     Patient Active Problem List   Diagnosis Date Noted  . Hypoglycemia 02/03/2020  . Hypothyroidism 02/02/2020  . Diabetic polyneuropathy associated with type 1 diabetes mellitus ([place]) 02/02/2020  . Uncontrolled type 1 diabetes mellitus with hypoglycemia without coma, with long-term current use of insulin ([place]) 12/30/2019  . DKA (diabetic ketoacidosis) ([place]) 12/29/2019  . Homelessness 12/29/2019  . Marijuana abuse 12/29/2019  . Tobacco dependence 12/29/2019  . Seizure disorder ([place]) 12/29/2019  . Suicidal ideation   . Bipolar I disorder, most recent episode depressed, severe without psychotic features ([place])   . Adjustment disorder with depressed mood  12/07/2017  . Noncompliance 10/17/2017  . Dysthymia 10/08/2017  . Personality disorder in adult [facility]) 09/26/2017  . 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 Obstructive hydrocephalus ([place]) 09/19/2017  . MDD (major depressive disorder) 08/22/2017  . Severe recurrent major depression with psychotic features ([place]) 08/19/2017  . Abdominal pain 08/18/2017  . Hyponatremia 08/18/2017  . DKA (diabetic ketoacidoses) ([place]) 11/24/2015  . Diabetic keto-acidosis ([place]) 10/23/2015    Past Surgical History:  Procedure Laterality Date  . KIDNEY STONE SURGERY         Family History  Problem Relation Age of Onset  . Breast cancer Mother     Social History   Tobacco Use  . Smoking status: Current Every Day Smoker    Packs/day: 0.75    Years: 20.00    Pack years: 15.00    Types: Cigarettes  . Smokeless tobacco: Never Used  Substance Use Topics  . Alcohol use: Never    Comment: occassional   . Drug use: Yes    Types: Marijuana    Comment: smoked 2-3 days ago    Home Medications Prior to Admission medications   Medication Sig Start Date End Date Taking? Authorizing Provider  blood glucose meter kit and supplies KIT Dispense based on patient and insurance preference. Use up to four times daily as directed. (FOR ICD-9 250.00, 250.01). 12/31/19   Amin, Jeanella Flattery, MD  divalproex (DEPAKOTE) 500 MG DR tablet Take 1 tablet (500 mg total) by mouth every 12 (twelve) hours. 12/31/19   Amin, Jeanella Flattery, MD  escitalopram (LEXAPRO) 10 MG tablet Take 1 tablet (10 mg  total) by mouth daily. 12/31/19   Amin, Jeanella Flattery, MD  insulin aspart (NOVOLOG) 100 UNIT/ML injection Inject 0-12 Units into the skin 3 (three) times daily with meals. Inject 0-12 units into the skin three times a day before meals: Blood Sugar: 60-110= none,111-150= 2 units,  151-200= 4 units,, 201-250= 6 units, 251-300= 8 units, 301-350= 10 units, >350= 12 units and CALL MD 02/05/20   Thurnell Lose, MD  insulin aspart protamine- aspart (NOVOLOG  MIX 70/30) (70-30) 100 UNIT/ML injection Inject 0.3 mLs (30 Units total) into the skin 2 (two) times daily with a meal. 02/05/20   Thurnell Lose, MD  Insulin Pen Needle (PEN NEEDLES 3/16") 31G X 5 MM MISC 1 Dose by Does not apply route 3 (three) times daily. 12/31/19   Amin, Jeanella Flattery, MD  levothyroxine (SYNTHROID) 100 MCG tablet Take 1 tablet (100 mcg total) by mouth daily before breakfast. 12/31/19   Amin, Jeanella Flattery, MD  pregabalin (LYRICA) 150 MG capsule Take 1 capsule (150 mg total) by mouth 2 (two) times daily. 12/31/19   Amin, Jeanella Flattery, MD  QUEtiapine (SEROQUEL) 100 MG tablet Take 1 tablet (100 mg total) by mouth at bedtime. 12/31/19   Amin, Jeanella Flattery, MD  traZODone (DESYREL) 50 MG tablet Take 1 tablet (50 mg total) by mouth at bedtime. 01/05/20   Davonna Belling, MD    Allergies    Mushroom extract complex, Penicillins, Sulfa antibiotics, and Clindamycin/lincomycin  Review of Systems   Review of Systems  Constitutional: Negative for fever.  Respiratory: Negative for shortness of breath.   Cardiovascular: Negative for chest pain.  Gastrointestinal: Negative for abdominal pain.  Neurological: Positive for headaches.  Psychiatric/Behavioral: Positive for self-injury and suicidal ideas.  All other systems reviewed and are negative.   Physical Exam Updated Vital Signs BP (!) 150/88   Pulse 75   Temp 98 F (36.7 C)   Resp 14   Ht 1.88 m (6' 2" )   Wt 113.4 kg   SpO2 97%   BMI 32.10 kg/m   Physical Exam CONSTITUTIONAL: Disheveled, no acute distress HEAD: Normocephalic/atraumatic EYES: EOMI/PERRL ENMT: Mucous membranes moist NECK: supple no meningeal signs SPINE/BACK:entire spine nontender CV: S1/S2 noted, no murmurs/rubs/gallops noted LUNGS: Lungs are clear to auscultation bilaterally, no apparent distress ABDOMEN: soft, nontender NEURO: Pt is awake/alert/appropriate, moves all extremitiesx4.  No facial droop.  EXTREMITIES: pulses normal/equal, full  ROM SKIN: warm, color normal PSYCH: Flat affect  ED Results / Procedures / Treatments   Labs (all labs ordered are listed, but only abnormal results are displayed) Labs Reviewed  CBC WITH DIFFERENTIAL/PLATELET - Abnormal; Notable for the following components:      Result Value   RBC 4.01 (*)    Hemoglobin 11.8 (*)    HCT 37.7 (*)    RDW 15.6 (*)    All other components within normal limits  COMPREHENSIVE METABOLIC PANEL - Abnormal; Notable for the following components:   Potassium 3.2 (*)    Glucose, Bld 37 (*)    BUN <5 (*)    AST 65 (*)    ALT 47 (*)    All other components within normal limits  ACETAMINOPHEN LEVEL - Abnormal; Notable for the following components:   Acetaminophen (Tylenol), Serum <10 (*)    All other components within normal limits  RAPID URINE DRUG SCREEN, HOSP PERFORMED - Abnormal; Notable for the following components:   Tetrahydrocannabinol POSITIVE (*)    All other components within normal limits  SALICYLATE LEVEL -  Abnormal; Notable for the following components:   Salicylate Lvl <8.1 (*)    All other components within normal limits  CBG MONITORING, ED - Abnormal; Notable for the following components:   Glucose-Capillary 60 (*)    All other components within normal limits  CBG MONITORING, ED - Abnormal; Notable for the following components:   Glucose-Capillary 51 (*)    All other components within normal limits  CBG MONITORING, ED - Abnormal; Notable for the following components:   Glucose-Capillary 48 (*)    All other components within normal limits  SARS CORONAVIRUS 2 (TAT 6-24 HRS)  ETHANOL  CBG MONITORING, ED    EKG EKG Interpretation  Date/Time:  Saturday Mar 05 2020 00:57:08 EDT Ventricular Rate:  81 PR Interval:    QRS Duration: 89 QT Interval:  358 QTC Calculation: 416 R Axis:   27 Text Interpretation: Sinus rhythm No significant change since last tracing Confirmed by Ripley Fraise [phone]) on 03/05/2020 1:33:57  AM   Radiology No results found.  Procedures .Critical Care Performed by: Ripley Fraise, MD Authorized by: Ripley Fraise, MD   Critical care provider statement:    Critical care time (minutes):  45   Critical care start time:  03/05/2020 12:30 AM   Critical care end time:  03/05/2020 12:15 AM   Critical care time was exclusive of:  Separately billable procedures and treating other patients   Critical care was necessary to treat or prevent imminent or life-threatening deterioration of the following conditions:  Endocrine crisis and toxidrome   Critical care was time spent personally by me on the following activities:  Evaluation of patient's response to treatment, discussions with consultants, development of treatment plan with patient or surrogate, examination of patient, review of old charts, re-evaluation of patient's condition, pulse oximetry, ordering and review of laboratory studies and ordering and performing treatments and interventions    Medications Ordered in ED Medications  dextrose 10 % infusion ( Intravenous New Bag/Given 03/05/20 0214)  dextrose 50 % solution 50 mL (50 mLs Intravenous Given 03/05/20 0152)    ED Course  I have reviewed the triage vital signs and the nursing notes.  Pertinent labs  results that were available during my care of the patient were reviewed by me and considered in my medical decision making (see chart for details).    MDM Rules/Calculators/A&P                      This patient presents to the ED for concern of insulin overdose, this involves an extensive number of treatment options, and is a complaint that carries with it a high risk of complications and morbidity.   Lab Tests:   I Ordered, reviewed, and interpreted labs, which included CBC, electrolytes, Tylenol level, salicylate level, drug screen  Medicines ordered:   I ordered medication IV dextrose for hypoglycemia   Additional history obtained:   Additional history  obtained from EMS  Previous records obtained and reviewed   Consultations Obtained:   I consulted poison center and Dr. Hal Hope and discussed lab findings  Reevaluation:  After the interventions stated above, I reevaluated the patient and found patient stabilized  Critical Interventions:  . IV dextrose  2:17 AM Patient presents after intentional overdose of insulin.  Patient's had frequent episodes of hypoglycemia.  Patient has required D50 boluses.  Patient is also taking oral fluids and food. Per poison center recommendations, patient will be admitted to the hospital   Final Clinical Impression(s) /  ED Diagnoses Final diagnoses:  Hypoglycemia  Insulin overdose, intentional self-harm, initial encounter [facility])    Rx / DC Orders ED Discharge Orders    None       Ripley Fraise, MD 03/05/20 [phone]

## 2020-03-05 NOTE — ED Notes (Signed)
cbg of 60 pt override pt given juice at this time. Pt a/o and corporative.

## 2020-03-05 NOTE — ED Notes (Signed)
Pt lying in bed watching t.v. NAD noted. Sitter at bedside. Will continue to monitor.

## 2020-03-05 NOTE — ED Provider Notes (Signed)
Confirmed appropriate therapy with poison center They recommend continue with D 20 drip.    Zadie Rhine, MD 03/05/20 [phone]

## 2020-03-05 NOTE — ED Notes (Signed)
Pt lying in bed watching t.v. sitter at bedside. Pt asking for his "psych meds". Will contact MD concerning meds. Pt cooperative and calm. Will continue to monitor.

## 2020-03-05 NOTE — ED Provider Notes (Signed)
Patient with continued frequent bouts of hypoglycemia.  He is on a D10 drip and has received multiple rounds of D50.  He has also been eating snacks and drinking soda.  His mental status remains appropriate.  Discussed the case with his inpatient physician Dr. Toniann Fail.  He will order a D-20 drip   Mark Rhine, MD 03/05/20 [phone]

## 2020-03-05 NOTE — H&P (Signed)
History and Physical    Mark [name] QIW:[id number] DOB: 12-20-1985 DOA: 03/05/2020  PCP: Patient, No Pcp Per  Patient coming from: Home.  Chief Complaint: Drug overdose.  HPI: Mark [name] is a 34 y.o. male with history of diabetes mellitus type 1 and bipolar disorder was admitted last month for DKA intentionally overdosed himself with his NovoLog 70/30 which he usually takes 30 units 3 times daily with taking 80 units along with Levemir 30 units somewhere around after his dinner tonight.  Patient states he was depressed and wanted to kill himself and thus why he did this but after doing this he called the police and patient was brought here.  Denies taking any other medications.  Patient has not been taking any of his psychiatric medication for last 1 month.  ED Course: In the ER patient blood sugar initially in the 60s dropped down to around 27 he had to be started on D10 and D50 ampules Poison control at this time recommended supportive care with D10 and D50 as necessary.  Patient's acetaminophen level salicylate level valproic acid levels were negative.  Labs show creatinine 1.7 AST 65 ALT 47 hemoglobin 11.8 platelets 258 EKG normal sinus rhythm with acceptable QTC.  Covid test is pending.  Review of Systems: As per HPI, rest all negative.   Past Medical History:  Diagnosis Date  . Bipolar disorder ([place])   . Depression   . Diabetes mellitus without complication ([place])   . Diabetic polyneuropathy associated with type 1 diabetes mellitus ([place])   . Hydrocephalus ([place])   . Kidney stones   . Seizures ([place])     Past Surgical History:  Procedure Laterality Date  . KIDNEY STONE SURGERY       reports that he has been smoking cigarettes. He has a 15.00 pack-year smoking history. He has never used smokeless tobacco. He reports current drug use. Drug: Marijuana. He reports that he does not drink alcohol.  Allergies  Allergen Reactions  . Mushroom Extract Complex Hives, Itching  and Nausea And Vomiting  . Penicillins Anaphylaxis, Hives and Swelling    Has patient had a PCN reaction causing immediate rash, facial/tongue/throat swelling, SOB or lightheadedness with hypotension: Yes Has patient had a PCN reaction causing severe rash involving mucus membranes or skin necrosis: No Has patient had a PCN reaction that required hospitalization: Yes Has patient had a PCN reaction occurring within the last 10 years: Yes If all of the above answers are "NO", then may proceed with Cephalosporin use.  . Sulfa Antibiotics Anaphylaxis and Hives  . Clindamycin/Lincomycin Hives    Family History  Problem Relation Age of Onset  . Breast cancer Mother     Prior to Admission medications   Medication Sig Start Date End Date Taking? Authorizing Provider  blood glucose meter kit and supplies KIT Dispense based on patient and insurance preference. Use up to four times daily as directed. (FOR ICD-9 250.00, 250.01). 12/31/19  Yes Amin, Ankit Chirag, MD  divalproex (DEPAKOTE) 500 MG DR tablet Take 1 tablet (500 mg total) by mouth every 12 (twelve) hours. 12/31/19  Yes Amin, Ankit Chirag, MD  escitalopram (LEXAPRO) 10 MG tablet Take 1 tablet (10 mg total) by mouth daily. 12/31/19  Yes Amin, Ankit Chirag, MD  insulin aspart protamine- aspart (NOVOLOG MIX 70/30) (70-30) 100 UNIT/ML injection Inject 0.3 mLs (30 Units total) into the skin 2 (two) times daily with a meal. Patient taking differently: Inject 35 Units into the skin 2 (two)  times daily with a meal.  02/05/20  Yes Thurnell Lose, MD  insulin detemir (LEVEMIR) 100 UNIT/ML injection Inject 30 Units into the skin at bedtime.   Yes [provider]  Insulin Pen Needle (PEN NEEDLES 3/16") 31G X 5 MM MISC 1 Dose by Does not apply route 3 (three) times daily. 12/31/19  Yes Amin, Jeanella Flattery, MD  levothyroxine (SYNTHROID) 100 MCG tablet Take 1 tablet (100 mcg total) by mouth daily before breakfast. 12/31/19  Yes Amin, Ankit Chirag, MD    pregabalin (LYRICA) 150 MG capsule Take 1 capsule (150 mg total) by mouth 2 (two) times daily. 12/31/19  Yes Amin, Jeanella Flattery, MD  QUEtiapine (SEROQUEL) 100 MG tablet Take 1 tablet (100 mg total) by mouth at bedtime. 12/31/19  Yes Amin, Jeanella Flattery, MD  traZODone (DESYREL) 50 MG tablet Take 1 tablet (50 mg total) by mouth at bedtime. 01/05/20  Yes Davonna Belling, MD  insulin aspart (NOVOLOG) 100 UNIT/ML injection Inject 0-12 Units into the skin 3 (three) times daily with meals. Inject 0-12 units into the skin three times a day before meals: Blood Sugar: 60-110= none,111-150= 2 units,  151-200= 4 units,, 201-250= 6 units, 251-300= 8 units, 301-350= 10 units, >350= 12 units and CALL MD Patient not taking: Reported on 03/05/2020 02/05/20   Thurnell Lose, MD    Physical Exam: Constitutional: Moderately built and nourished. Vitals:   03/05/20 0130 03/05/20 0200 03/05/20 0230 03/05/20 0330  BP: (!) 150/88 (!) 136/93 126/84   Pulse: 75 88 85 78  Resp: 14 (!) _0 Temp:      SpO2: 97% 99% 96% 98%  Weight:      Height:       Eyes: Anicteric no pallor. ENMT: No discharge from the ears eyes nose or mouth. Neck: No mass felt.  No neck rigidity. Respiratory: No rhonchi or crepitations. Cardiovascular: S1-S2 heard. Abdomen: Soft nontender bowel sound present. Musculoskeletal: No edema. Skin: No rash. Neurologic: Alert awake oriented to time place and person.  Moves all extremities 5 x 5. Psychiatric: Patient is depressed and suicidal.   Labs on Admission: I have personally reviewed following labs and imaging studies  CBC: Recent Labs  Lab 03/05/20 0058  WBC 7.2  NEUTROABS 3.5  HGB 11.8*  HCT 37.7*  MCV 94.0  PLT 817   Basic Metabolic Panel: Recent Labs  Lab 03/05/20 0058  NA 141  K 3.2*  CL 110  CO2 26  GLUCOSE 37*  BUN <5*  CREATININE 0.78  CALCIUM 9.0   GFR: Estimated Creatinine Clearance: 171 mL/min (by C-G formula based on SCr of 0.78 mg/dL). Liver  Function Tests: Recent Labs  Lab 03/05/20 0058  AST 65*  ALT 47*  ALKPHOS 88  BILITOT 0.4  PROT 6.7  ALBUMIN 3.9   No results for input(s): LIPASE, AMYLASE in the last 168 hours. No results for input(s): AMMONIA in the last 168 hours. Coagulation Profile: No results for input(s): INR, PROTIME in the last 168 hours. Cardiac Enzymes: No results for input(s): CKTOTAL, CKMB, CKMBINDEX, TROPONINI in the last 168 hours. BNP (last 3 results) No results for input(s): PROBNP in the last 8760 hours. HbA1C: No results for input(s): HGBA1C in the last 72 hours. CBG: Recent Labs  Lab 03/05/20 0141 03/05/20 0207 03/05/20 0242 03/05/20 0311 03/05/20 0338  GLUCAP 48* 73 27* 81 59*   Lipid Profile: No results for input(s): CHOL, HDL, LDLCALC, TRIG, CHOLHDL, LDLDIRECT in the last 72 hours. Thyroid Function  Tests: No results for input(s): TSH, T4TOTAL, FREET4, T3FREE, THYROIDAB in the last 72 hours. Anemia Panel: No results for input(s): VITAMINB12, FOLATE, FERRITIN, TIBC, IRON, RETICCTPCT in the last 72 hours. Urine analysis:    Component Value Date/Time   COLORURINE STRAW (A) 02/02/2020 0157   APPEARANCEUR CLEAR 02/02/2020 0157   APPEARANCEUR Clear 01/20/2015 2136   LABSPEC 1.005 02/02/2020 0157   LABSPEC 1.023 01/20/2015 2136   PHURINE 6.0 02/02/2020 0157   GLUCOSEU NEGATIVE 02/02/2020 0157   GLUCOSEU >=500 01/20/2015 2136   HGBUR NEGATIVE 02/02/2020 0157   BILIRUBINUR NEGATIVE 02/02/2020 0157   BILIRUBINUR Negative 01/20/2015 2136   KETONESUR NEGATIVE 02/02/2020 0157   PROTEINUR NEGATIVE 02/02/2020 0157   NITRITE NEGATIVE 02/02/2020 0157   LEUKOCYTESUR NEGATIVE 02/02/2020 0157   LEUKOCYTESUR Negative 01/20/2015 2136   Sepsis Labs: _0 (procalcitonin:4,lacticidven:4) )No results found for this or any previous visit (from the past 240 hour(s)).   Radiological Exams on Admission: No results found.  EKG: Independently reviewed.  Normal sinus rhythm with QTC of 460  ms.  Assessment/Plan Principal Problem:   Drug overdose, intentional ([place]) Active Problems:   Suicidal ideation   Bipolar I disorder, most recent episode depressed, severe without psychotic features ([place])   Drug overdose    1. Intentional overdose with insulin with suicidal ideation -I discussed with the poison control.  Since patient's blood sugar despite being on D10 150 cc/h patient has been becoming hypoglycemic so we are going to start the 20 and as needed D50 ampules.  Check CBGs closely. 2. Suicidal ideation we will keep patient on suicide precaution consult psychiatry in the morning.  Patient has known history of bipolar disorder and has not been taking any of his medicine. 3. Mildly elevated LFTs cause not clear patient denies taking any Tylenol.  Will repeat Tylenol level and follow LFTs and check acute hepatitis panel with next blood draw. 4. Hypothyroidism has not been taking his medicines for last 1 month check TSH.  Restart Synthroid based on which. 5. Anemia follow CBC. 6. Mild hypokalemia for which her poison control advised not to overtly correct since patient has elevated insulin levels now due to overdose may become hyperkalemic once insulin levels code on.  Covid test is pending.   DVT prophylaxis: We will keep patient on SCDs for now since patient has a drug overdose. Code Status: Full code. Family Communication: Discussed with patient. Disposition Plan: To be determined. Consults called: Discussed with poison control. Admission status: Observation.   Rise Patience MD [facility] Pager [phone].  If 7PM-7AM, please contact night-coverage [email or web address] Password [facility]  03/05/2020, 4:07 AM

## 2020-03-05 NOTE — ED Notes (Signed)
Md made aware of sugar of 51 stated provided pt with sandwich and juice and recheck

## 2020-03-05 NOTE — ED Notes (Signed)
Caryn Bee with Poison control called for update. Advised to continue current treatment and repeat valproic acid at 6 hours.

## 2020-03-05 NOTE — ED Notes (Signed)
D 10 hung by EMS pt has received .

## 2020-03-05 NOTE — ED Notes (Signed)
Pt lying in bed talking on the phone. Graham crackers and peanut butter given. Pt denies any other needs. Will continue to monitor. Sitter at bedside.

## 2020-03-05 NOTE — ED Notes (Signed)
Pt sitting up in bed. Sandwich given. NAD noted. Sitter at bedside. Will continue to monitor. Per MD, after x4 hourly blood sugars, pt can be changed to q4hour BS check.

## 2020-03-05 NOTE — ED Notes (Signed)
Pt ate all of their lunch

## 2020-03-05 NOTE — ED Notes (Signed)
Pt refused vitals. RN notified

## 2020-03-05 NOTE — ED Notes (Signed)
CBG taken at 0655 not transferring over result of 55

## 2020-03-05 NOTE — ED Notes (Signed)
Poison control called and advised 24 hour obs. They recommend continues D10 infusion and PRN D 50.  0400 tylenol level. Frequent cbg's. Advise with 24 obs hour pt must be d/c from dextrose infusions and have stable cbg for 6 hours prior to discharge.

## 2020-03-05 NOTE — ED Notes (Signed)
Pt lying in bed watching t.v. sandwich given. Sitter at bedside. Pt switched to an admission bed for comfort. Pt denies any needs. Will continue to monitor.

## 2020-03-05 NOTE — ED Notes (Signed)
Pt remains a/o Pt provided sandwich and crackers.

## 2020-03-05 NOTE — ED Triage Notes (Signed)
BIB EMS from [facility]. Pt stating he is SI. Pt stated he took 30 units of levemir and 80 units or 70/30 Regular/NPH.  Hx of type one diabetes. Pt aslo complains of neuropathy pain and headaches.   CBG with Ems 60 140/82 90 98%  16

## 2020-03-05 NOTE — ED Notes (Signed)
Called pharmacy regarding giving D20 through PIV. Advised it is best through central line but is acceptable through PIV for short term use up to a few hours but will need central line if prolong use.

## 2020-03-05 NOTE — ED Notes (Signed)
MD made aware CBG of 40 advise drink soda.

## 2020-03-06 ENCOUNTER — Other Ambulatory Visit: Payer: Self-pay

## 2020-03-06 LAB — CBC WITH DIFFERENTIAL/PLATELET
Abs Immature Granulocytes: 0.05 10*3/uL (ref 0.00–0.07)
Basophils Absolute: 0 10*3/uL (ref 0.0–0.1)
Basophils Relative: 1 %
Eosinophils Absolute: 0.4 10*3/uL (ref 0.0–0.5)
Eosinophils Relative: 6 %
HCT: 39.8 % (ref 39.0–52.0)
Hemoglobin: 12.7 g/dL — ABNORMAL LOW (ref 13.0–17.0)
Immature Granulocytes: 1 %
Lymphocytes Relative: 30 %
Lymphs Abs: 2.1 10*3/uL (ref 0.7–4.0)
MCH: 29.7 pg (ref 26.0–34.0)
MCHC: 31.9 g/dL (ref 30.0–36.0)
MCV: 93.2 fL (ref 80.0–100.0)
Monocytes Absolute: 0.5 10*3/uL (ref 0.1–1.0)
Monocytes Relative: 7 %
Neutro Abs: 3.9 10*3/uL (ref 1.7–7.7)
Neutrophils Relative %: 55 %
Platelets: 236 10*3/uL (ref 150–400)
RBC: 4.27 MIL/uL (ref 4.22–5.81)
RDW: 15.9 % — ABNORMAL HIGH (ref 11.5–15.5)
WBC: 7 10*3/uL (ref 4.0–10.5)
nRBC: 0 % (ref 0.0–0.2)

## 2020-03-06 LAB — COMPREHENSIVE METABOLIC PANEL
ALT: 37 U/L (ref 0–44)
AST: 42 U/L — ABNORMAL HIGH (ref 15–41)
Albumin: 3.1 g/dL — ABNORMAL LOW (ref 3.5–5.0)
Alkaline Phosphatase: 81 U/L (ref 38–126)
Anion gap: 7 (ref 5–15)
BUN: 7 mg/dL (ref 6–20)
CO2: 22 mmol/L (ref 22–32)
Calcium: 8.6 mg/dL — ABNORMAL LOW (ref 8.9–10.3)
Chloride: 107 mmol/L (ref 98–111)
Creatinine, Ser: 0.81 mg/dL (ref 0.61–1.24)
GFR calc Af Amer: >60 mL/min (ref >60)
GFR calc non Af Amer: >60 mL/min (ref >60)
Glucose, Bld: 356 mg/dL — ABNORMAL HIGH (ref 70–99)
Potassium: 4.9 mmol/L (ref 3.5–5.1)
Sodium: 136 mmol/L (ref 135–145)
Total Bilirubin: 0.4 mg/dL (ref 0.3–1.2)
Total Protein: 5.5 g/dL — ABNORMAL LOW (ref 6.5–8.1)

## 2020-03-06 LAB — FERRITIN: Ferritin: 20 ng/mL — ABNORMAL LOW (ref 24–336)

## 2020-03-06 LAB — IRON AND TIBC
Iron: 56 ug/dL (ref 45–182)
Saturation Ratios: 16 % — ABNORMAL LOW (ref 17.9–39.5)
TIBC: 340 ug/dL (ref 250–450)
UIBC: 284 ug/dL

## 2020-03-06 LAB — FOLATE: Folate: 10.2 ng/mL (ref >5.9)

## 2020-03-06 LAB — GLUCOSE, CAPILLARY
Glucose-Capillary: 122 mg/dL — ABNORMAL HIGH (ref 70–99)
Glucose-Capillary: 134 mg/dL — ABNORMAL HIGH (ref 70–99)
Glucose-Capillary: 229 mg/dL — ABNORMAL HIGH (ref 70–99)
Glucose-Capillary: 375 mg/dL — ABNORMAL HIGH (ref 70–99)
Glucose-Capillary: 68 mg/dL — ABNORMAL LOW (ref 70–99)

## 2020-03-06 LAB — CBG MONITORING, ED
Glucose-Capillary: 189 mg/dL — ABNORMAL HIGH (ref 70–99)
Glucose-Capillary: 448 mg/dL — ABNORMAL HIGH (ref 70–99)
Glucose-Capillary: 408 mg/dL — ABNORMAL HIGH (ref 70–99)
Glucose-Capillary: 503 mg/dL (ref 70–99)
Glucose-Capillary: 434 mg/dL — ABNORMAL HIGH (ref 70–99)

## 2020-03-06 LAB — PHOSPHORUS: Phosphorus: 2.6 mg/dL (ref 2.5–4.6)

## 2020-03-06 LAB — RETICULOCYTES
Immature Retic Fract: 12 % (ref 2.3–15.9)
RBC.: 4.3 MIL/uL (ref 4.22–5.81)
Retic Count, Absolute: 55.9 10*3/uL (ref 19.0–186.0)
Retic Ct Pct: 1.3 % (ref 0.4–3.1)

## 2020-03-06 LAB — VITAMIN B12: Vitamin B-12: 185 pg/mL (ref 180–914)

## 2020-03-06 LAB — MAGNESIUM: Magnesium: 1.7 mg/dL (ref 1.7–2.4)

## 2020-03-06 MED ORDER — LEVOTHYROXINE SODIUM 50 MCG PO TABS
50.0000 ug | ORAL_TABLET | Freq: Every day | ORAL | Status: DC
  Administered 2020-03-07 – 2020-03-13 (×6): 50 ug via ORAL
  Filled 2020-03-06 (×6): qty 1

## 2020-03-06 MED ORDER — ATORVASTATIN CALCIUM 20 MG PO TABS
20.0000 mg | ORAL_TABLET | Freq: Every day | ORAL | Status: DC
  Administered 2020-03-06 – 2020-03-13 (×8): 20 mg via ORAL
  Filled 2020-03-06 (×2): qty 1
  Filled 2020-03-06: qty 2
  Filled 2020-03-06 (×5): qty 1

## 2020-03-06 MED ORDER — INSULIN DETEMIR 100 UNIT/ML ~~LOC~~ SOLN
30.0000 [IU] | Freq: Every day | SUBCUTANEOUS | Status: DC
  Administered 2020-03-06 – 2020-03-07 (×2): 30 [IU] via SUBCUTANEOUS
  Filled 2020-03-06 (×3): qty 0.3

## 2020-03-06 MED ORDER — INSULIN ASPART 100 UNIT/ML ~~LOC~~ SOLN
0.0000 [IU] | SUBCUTANEOUS | Status: DC
  Administered 2020-03-06: 20 [IU] via SUBCUTANEOUS
  Administered 2020-03-06: 7 [IU] via SUBCUTANEOUS
  Administered 2020-03-07: 15 [IU] via SUBCUTANEOUS
  Administered 2020-03-07: 3 [IU] via SUBCUTANEOUS
  Administered 2020-03-07: 4 [IU] via SUBCUTANEOUS
  Administered 2020-03-07 (×2): 3 [IU] via SUBCUTANEOUS
  Administered 2020-03-08 (×3): 4 [IU] via SUBCUTANEOUS
  Administered 2020-03-08: 11 [IU] via SUBCUTANEOUS
  Administered 2020-03-08 – 2020-03-09 (×2): 7 [IU] via SUBCUTANEOUS
  Administered 2020-03-09 (×2): 3 [IU] via SUBCUTANEOUS
  Filled 2020-03-06: qty 0.2

## 2020-03-06 MED ORDER — ACETAMINOPHEN 325 MG PO TABS
650.0000 mg | ORAL_TABLET | Freq: Four times a day (QID) | ORAL | Status: DC | PRN
  Administered 2020-03-08 – 2020-03-09 (×2): 650 mg via ORAL
  Filled 2020-03-06 (×2): qty 2

## 2020-03-06 MED ORDER — INSULIN DETEMIR 100 UNIT/ML ~~LOC~~ SOLN: 30.0000 [IU] | Freq: Every day | SUBCUTANEOUS | Status: DC

## 2020-03-06 NOTE — ED Notes (Signed)
Pt will not keep cardiac lines and bp on.

## 2020-03-06 NOTE — ED Notes (Signed)
Pt up sitting at bedside in a chair. Pt states he accidentally wet the bed. Linens changed. Sitter at bedside. NAD noted. Will continue to monitor.

## 2020-03-06 NOTE — ED Notes (Signed)
Unable to pull labs off patients IVs

## 2020-03-06 NOTE — ED Notes (Signed)
Pt lying in bed, eye's closed, chest rising and falling. Sitter at bedside. Will continue to monitor.

## 2020-03-06 NOTE — ED Notes (Signed)
Per lab need recollect for lavender. Also need gold tubes.

## 2020-03-06 NOTE — Progress Notes (Signed)
Hypoglycemic Event  CBG: 68  Treatment:Drank glass of soda and Pt received meal tray  Symptoms: none  Follow-up CBG: Time:1655 (After Pt finished eating  CBG Result:122  Comments/MD notified:MD Richrd Sox

## 2020-03-06 NOTE — Progress Notes (Signed)
PROGRESS NOTE    Mark [name]  PIR:[id number] DOB: Jul 12, 1986 DOA: 03/05/2020 PCP: Patient, No Pcp Per     Brief Narrative:  Mark [name] is a 34 y.o. WM PMHx DM Type 1 uncontrolled with complication,Bipolar disorder was admitted last month for DKA, drug abuse  intentionally overdosed himself with his NovoLog 70/30 which he usually takes 30 units 3 times daily with taking 80 units along with Levemir 30 units somewhere around after his dinner tonight.  Patient states he was depressed and wanted to kill himself and thus why he did this but after doing this he called the police and patient was brought here.  Denies taking any other medications.  Patient has not been taking any of his psychiatric medication for last 1 month.  ED Course: In the ER patient blood sugar initially in the 60s dropped down to around 27 he had to be started on D10 and D50 ampules Poison control at this time recommended supportive care with D10 and D50 as necessary.  Patient's acetaminophen level salicylate level valproic acid levels were negative.  Labs show creatinine 1.7 AST 65 ALT 47 hemoglobin 11.8 platelets 258 EKG normal sinus rhythm with acceptable QTC.  Covid test is pending.   Subjective: 5/16 A/O x4, continues to want to hurt himself.  Does not want to harm anyone else.  Negative CP, negative S OB, negative abdominal pain.  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 [facility])   Noncompliance   Suicidal ideation   Bipolar I disorder, most recent episode depressed, severe without psychotic features ([place])   Marijuana abuse   Tobacco dependence   Seizure disorder ([place])   Uncontrolled type 1 diabetes mellitus with hypoglycemia without coma, with long-term current use of insulin ([place])   Hypothyroidism  Diabetic polyneuropathy associated with type 1 diabetes mellitus ([place])   Drug overdose   Drug abuse ([place])   Attempted suicide ([place])  Suicide attempt/intentional overdose -Intentional overdose with insulin -5/15 psychiatry consulted -Since patient's blood sugar despite being on D10 150 cc/h patient has been becoming hypoglycemic so we are going to start the 20 and as needed D50 ampules.  Check CBGs closely.  Bipolar disorder 5/16 still awaiting psychiatry recommendation on medication.  Considering starting patient on lithium  Hypokalemia -Potassium goal> 4   Hypomagnesmia -Magnesium goal> 2  DM type I uncontrolled with complication -6/30 hemoglobin A1c= 8.8 -5/16 Levemir 30 units daily -5/16 resistant SSI -Lipid panel pending -Sensitive SSI  HLD -5/15 LDL= 153 -5/13 Lipitor 20 mg daily  Hypoglycemia -5/16 discontinue D 20 at 139ml/hr  Drug abuse  -5/15 urine tox screen positive marijuana - Hypothyroidism -TSH 18.4 -5/15 free T4 low 0.60 -5/16 free T3 pending -5/16 Synthroid micrograms daily -Watch for refeeding syndrome  Anemia -Anemia panel pending -occult blood pending      DVT prophylaxis: Lovenox Code Status: Full Family Communication:  Disposition Plan:  Status is: Inpatient    Dispo: The patient is from: Home              Anticipated d/c is to: Home              Anticipated d/c date is: May 25              Patient currently unstable  Consultants:    Procedures/Significant Events:    I have personally reviewed and interpreted all radiology studies and my findings are as above.  VENTILATOR SETTINGS:    Cultures   Antimicrobials:    Devices    LINES / TUBES:      Continuous Infusions:    Objective: Vitals:   03/05/20 0957 03/05/20 1732 03/05/20 2012 03/06/20 0323  BP: 129/75 136/80 134/86 121/81  Pulse: 83 70 73 71  Resp: 18 18 18 19   Temp:   99 F (37.2 C) 98.3 F (36.8 C)  TempSrc:   Oral Oral  SpO2:  100% 100% 100% 98%  Weight:      Height:        Intake/Output Summary (Last 24 hours) at 03/06/2020 03/08/2020 Last data filed at 03/06/2020 0600 Gross per 24 hour  Intake 1364.66 ml  Output 4050 ml  Net -2685.34 ml   Filed Weights   03/05/20 0019  Weight: 113.4 kg    Examination:  General: A/O x4, no acute respiratory distress Eyes: negative scleral hemorrhage, negative anisocoria, negative icterus ENT: Negative Runny nose, negative gingival bleeding, EXTREMELY poor dentation Neck:  Negative scars, masses, torticollis, lymphadenopathy, JVD Lungs: Clear to auscultation bilaterally without wheezes or crackles Cardiovascular: Regular rate and rhythm without murmur gallop or rub normal S1 and S2 Abdomen: negative abdominal pain, nondistended, positive soft, bowel sounds, no rebound, no ascites, no appreciable mass Extremities: No significant cyanosis, clubbing, or edema bilateral lower extremities Skin: Negative rashes, lesions, ulcers Psychiatric: Positive depression, positive anxiety, negative fatigue, negative mania  Central nervous system:  Cranial nerves II through XII intact, tongue/uvula midline, all extremities muscle strength 5/5, sensation intact throughout, negative dysarthria, negative expressive aphasia, negative receptive aphasia.  .     Data Reviewed: Care during the described time interval was provided by me .  I have reviewed this patient's available data, including medical history, events of note, physical examination, and all test results as part of my evaluation.  CBC: Recent Labs  Lab 03/05/20 0058 03/05/20 0457  WBC 7.2 7.2  NEUTROABS 3.5 3.1  HGB 11.8* 11.5*  HCT 37.7* 35.7*  MCV 94.0 92.7  PLT 258 275   Basic Metabolic Panel: Recent Labs  Lab 03/05/20 0058 03/05/20 0457 03/05/20 1711 03/06/20 0500  NA 141 140  --  136  K 3.2* 3.1* 4.2 4.9  CL 110 109  --  107  CO2 26 24  --  22  GLUCOSE 37* 69*  --  356*  BUN <5* 5*  --  7  CREATININE 0.78  0.78  --  0.81  CALCIUM 9.0 9.0  --  8.6*  MG  --  1.7 1.8 1.7  PHOS  --   --   --  2.6   GFR: Estimated Creatinine Clearance: 168.9 mL/min (by C-G formula based on SCr of 0.81 mg/dL). Liver Function Tests: Recent Labs  Lab 03/05/20 0058 03/05/20 0457 03/06/20 0500  AST 65* 53* 42*  ALT 47* 41 37  ALKPHOS 88 83 81  BILITOT 0.4 0.4 0.4  PROT 6.7 6.1* 5.5*  ALBUMIN 3.9 3.4* 3.1*   No results for input(s): LIPASE, AMYLASE in the last 168 hours. No results for input(s): AMMONIA in the last 168 hours. Coagulation Profile: No results for input(s): INR, PROTIME in the last 168 hours. Cardiac Enzymes: No results for input(s): CKTOTAL, CKMB, CKMBINDEX, TROPONINI in the last 168 hours. BNP (last 3 results) No results for input(s): PROBNP in the last 8760 hours. HbA1C:  No results for input(s): HGBA1C in the last 72 hours. CBG: Recent Labs  Lab 03/05/20 2117 03/05/20 2203 03/05/20 2307 03/06/20 0300 03/06/20 0659  GLUCAP 187* 148* 133* 189* 448*   Lipid Profile: Recent Labs    03/05/20 1711  CHOL 214*  HDL 45  LDLCALC 153*  TRIG 81  CHOLHDL 4.8   Thyroid Function Tests: Recent Labs    03/05/20 0457 03/05/20 1711  TSH 18.433*  --   FREET4  --  0.60*   Anemia Panel: No results for input(s): VITAMINB12, FOLATE, FERRITIN, TIBC, IRON, RETICCTPCT in the last 72 hours. Sepsis Labs: No results for input(s): PROCALCITON, LATICACIDVEN in the last 168 hours.  Recent Results (from the past 240 hour(s))  SARS CORONAVIRUS 2 (TAT 6-24 HRS) Nasopharyngeal Nasopharyngeal Swab     Status: None   Collection Time: 03/05/20  2:16 AM   Specimen: Nasopharyngeal Swab  Result Value Ref Range Status   SARS Coronavirus 2 NEGATIVE NEGATIVE Final    Comment: (NOTE) SARS-CoV-2 target nucleic acids are NOT DETECTED. The SARS-CoV-2 RNA is generally detectable in upper and lower respiratory specimens during the acute phase of infection. Negative results do not preclude SARS-CoV-2  infection, do not rule out co-infections with other pathogens, and should not be used as the sole basis for treatment or other patient management decisions. Negative results must be combined with clinical observations, patient history, and epidemiological information. The expected result is Negative. Fact Sheet for Patients: HairSlick.no Fact Sheet for Healthcare Providers: quierodirigir.com This test is not yet approved or cleared by the [place] FDA and  has been authorized for detection and/or diagnosis of SARS-CoV-2 by FDA under an Emergency Use Authorization (EUA). This EUA will remain  in effect (meaning this test can be used) for the duration of the COVID-19 declaration under Section 56 4(b)(1) of the Act, 21 U.S.C. section 360bbb-3(b)(1), unless the authorization is terminated or revoked sooner. Performed at [facility], [address] [place], [place] [address]          Radiology Studies: No results found.      Scheduled Meds: . divalproex  500 mg Oral Q12H  . enoxaparin (LOVENOX) injection  0.5 mg/kg Subcutaneous Q24H  . escitalopram  10 mg Oral Daily  . insulin aspart  0-9 Units Subcutaneous Q4H  . QUEtiapine  100 mg Oral QHS  . traZODone  50 mg Oral QHS   Continuous Infusions:    LOS: 1 day    Time spent:40 min    Nanami Whitelaw, Roselind Messier, MD [facility] Pager [phone]  If 7PM-7AM, please contact night-coverage [email or web address] Password [facility] 03/06/2020, 7:18 AM

## 2020-03-06 NOTE — ED Notes (Signed)
On rounding this RN noticed a 2 liter dr pepper at bedside. CBG obtained. MD made aware and new orders placed to address cbg.    Sitter at bedside.

## 2020-03-06 NOTE — ED Notes (Signed)
Per woods, MD give 20 units novolog subq.

## 2020-03-07 LAB — COMPREHENSIVE METABOLIC PANEL
ALT: 37 U/L (ref 0–44)
AST: 34 U/L (ref 15–41)
Albumin: 3.5 g/dL (ref 3.5–5.0)
Alkaline Phosphatase: 98 U/L (ref 38–126)
Anion gap: 7 (ref 5–15)
BUN: 12 mg/dL (ref 6–20)
CO2: 29 mmol/L (ref 22–32)
Calcium: 9.2 mg/dL (ref 8.9–10.3)
Chloride: 106 mmol/L (ref 98–111)
Creatinine, Ser: 0.87 mg/dL (ref 0.61–1.24)
GFR calc Af Amer: >60 mL/min (ref >60)
GFR calc non Af Amer: >60 mL/min (ref >60)
Glucose, Bld: 98 mg/dL (ref 70–99)
Potassium: 3.9 mmol/L (ref 3.5–5.1)
Sodium: 142 mmol/L (ref 135–145)
Total Bilirubin: 0.6 mg/dL (ref 0.3–1.2)
Total Protein: 6.6 g/dL (ref 6.5–8.1)

## 2020-03-07 LAB — CBC WITH DIFFERENTIAL/PLATELET
Abs Immature Granulocytes: 0.03 10*3/uL (ref 0.00–0.07)
Basophils Absolute: 0 10*3/uL (ref 0.0–0.1)
Basophils Relative: 1 %
Eosinophils Absolute: 0.5 10*3/uL (ref 0.0–0.5)
Eosinophils Relative: 7 %
HCT: 39.8 % (ref 39.0–52.0)
Hemoglobin: 13 g/dL (ref 13.0–17.0)
Immature Granulocytes: 0 %
Lymphocytes Relative: 39 %
Lymphs Abs: 2.9 10*3/uL (ref 0.7–4.0)
MCH: 29.6 pg (ref 26.0–34.0)
MCHC: 32.7 g/dL (ref 30.0–36.0)
MCV: 90.7 fL (ref 80.0–100.0)
Monocytes Absolute: 0.5 10*3/uL (ref 0.1–1.0)
Monocytes Relative: 7 %
Neutro Abs: 3.4 10*3/uL (ref 1.7–7.7)
Neutrophils Relative %: 46 %
Platelets: 256 10*3/uL (ref 150–400)
RBC: 4.39 MIL/uL (ref 4.22–5.81)
RDW: 15.2 % (ref 11.5–15.5)
WBC: 7.3 10*3/uL (ref 4.0–10.5)
nRBC: 0 % (ref 0.0–0.2)

## 2020-03-07 LAB — GLUCOSE, CAPILLARY
Glucose-Capillary: 121 mg/dL — ABNORMAL HIGH (ref 70–99)
Glucose-Capillary: 126 mg/dL — ABNORMAL HIGH (ref 70–99)
Glucose-Capillary: 186 mg/dL — ABNORMAL HIGH (ref 70–99)
Glucose-Capillary: 190 mg/dL — ABNORMAL HIGH (ref 70–99)
Glucose-Capillary: 310 mg/dL — ABNORMAL HIGH (ref 70–99)
Glucose-Capillary: 332 mg/dL — ABNORMAL HIGH (ref 70–99)
Glucose-Capillary: 88 mg/dL (ref 70–99)

## 2020-03-07 LAB — T3, FREE: T3, Free: 3.4 pg/mL (ref 2.0–4.4)

## 2020-03-07 LAB — MAGNESIUM: Magnesium: 2 mg/dL (ref 1.7–2.4)

## 2020-03-07 LAB — PHOSPHORUS: Phosphorus: 3.6 mg/dL (ref 2.5–4.6)

## 2020-03-07 MED ORDER — MELATONIN 3 MG PO TABS
6.0000 mg | ORAL_TABLET | Freq: Every evening | ORAL | Status: DC | PRN
  Administered 2020-03-07 – 2020-03-11 (×5): 6 mg via ORAL
  Filled 2020-03-07 (×6): qty 2

## 2020-03-07 NOTE — Progress Notes (Signed)
PROGRESS NOTE    Mark [name]  WCB:[id number] DOB: May 05, 1986 DOA: 03/05/2020 PCP: Patient, No Pcp Per     Brief Narrative:  Mark [name] is a 34 y.o. WM PMHx DM Type 1 uncontrolled with complication,Bipolar disorder was admitted last month for DKA, drug abuse  intentionally overdosed himself with his NovoLog 70/30 which he usually takes 30 units 3 times daily with taking 80 units along with Levemir 30 units somewhere around after his dinner tonight.  Patient states he was depressed and wanted to kill himself and thus why he did this but after doing this he called the police and patient was brought here.  Denies taking any other medications.  Patient has not been taking any of his psychiatric medication for last 1 month.  ED Course: In the ER patient blood sugar initially in the 60s dropped down to around 27 he had to be started on D10 and D50 ampules Poison control at this time recommended supportive care with D10 and D50 as necessary.  Patient's acetaminophen level salicylate level valproic acid levels were negative.  Labs show creatinine 1.7 AST 65 ALT 47 hemoglobin 11.8 platelets 258 EKG normal sinus rhythm with acceptable QTC.  Covid test is pending.   Subjective: 5/17 A/O x4, negative CP, negative S OB, negative abdominal pain.  Still wants to harm himself.  Does not want to harm anyone else.  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 [facility])   Noncompliance   Suicidal ideation   Bipolar I disorder, most recent episode depressed, severe without psychotic features (HCC)   Marijuana abuse   Tobacco dependence   Seizure disorder (HCC)   Uncontrolled type 1 diabetes mellitus with hypoglycemia without coma, with long-term current use of insulin (HCC)   Hypothyroidism   Diabetic polyneuropathy  associated with type 1 diabetes mellitus (HCC)   Drug overdose   Drug abuse (HCC)   Attempted suicide (HCC)  Suicide attempt/intentional overdose -Intentional overdose with insulin -5/15 psychiatry consulted -5/17 again placed psychiatry consult for patient who states I want to kill myself  Bipolar disorder 5/16 still awaiting psychiatry recommendation on medication.  Considering starting patient on lithium  Hypokalemia -Potassium goal> 4   Hypomagnesmia -Magnesium goal> 2  DM type I uncontrolled with complication -4/14 hemoglobin A1c= 8.8 -5/16 Levemir 30 units daily -5/16 resistant SSI -Lipid panel pending -Sensitive SSI  HLD -5/15 LDL= 153 -5/13 Lipitor 20 mg daily  Hypoglycemia -5/16 discontinue D 20 at 159ml/hr  Drug abuse  -5/15 urine tox screen positive marijuana - Hypothyroidism -TSH 18.4 -5/15 free T4 low 0.60 -5/16 free T3 pending -5/16 Synthroid 50 mcg daily -Watch for refeeding syndrome  Normocytic anemia -Anemia panel; mild normocytic anemia -occult blood pending      DVT prophylaxis: Lovenox Code Status: Full Family Communication:  Disposition Plan:  Status is: Inpatient    Dispo: The patient is from: Home              Anticipated d/c is to: Inpatient psychiatric facility              Anticipated d/c date is: May 25              Patient currently unstable      Consultants:    Procedures/Significant Events:    I have personally reviewed and interpreted  all radiology studies and my findings are as above.  VENTILATOR SETTINGS:    Cultures   Antimicrobials:    Devices    LINES / TUBES:      Continuous Infusions:    Objective: Vitals:   03/06/20 1705 03/06/20 2006 03/06/20 2349 03/07/20 0451  BP: (!) 139/92 136/86 132/76 118/76  Pulse: 67 70 69 (!) 59  Resp: 18 17    Temp: 98.4 F (36.9 C) 98.5 F (36.9 C) 98.5 F (36.9 C) 98 F (36.7 C)  TempSrc: Oral  Oral Oral  SpO2: 100% 98% 100% 93%  Weight:       Height:        Intake/Output Summary (Last 24 hours) at 03/07/2020 1303 Last data filed at 03/07/2020 1055 Gross per 24 hour  Intake 1580 ml  Output 3300 ml  Net -1720 ml   Filed Weights   03/05/20 0019  Weight: 113.4 kg    Examination:  General: A/O x4, no acute respiratory distress Eyes: negative scleral hemorrhage, negative anisocoria, negative icterus ENT: Negative Runny nose, negative gingival bleeding, EXTREMELY poor dentation Neck:  Negative scars, masses, torticollis, lymphadenopathy, JVD Lungs: Clear to auscultation bilaterally without wheezes or crackles Cardiovascular: Regular rate and rhythm without murmur gallop or rub normal S1 and S2 Abdomen: negative abdominal pain, nondistended, positive soft, bowel sounds, no rebound, no ascites, no appreciable mass Extremities: No significant cyanosis, clubbing, or edema bilateral lower extremities Skin: Negative rashes, lesions, ulcers Psychiatric: Positive depression, positive anxiety, negative fatigue, negative mania  Central nervous system:  Cranial nerves II through XII intact, tongue/uvula midline, all extremities muscle strength 5/5, sensation intact throughout, negative dysarthria, negative expressive aphasia, negative receptive aphasia.  .     Data Reviewed: Care during the described time interval was provided by me .  I have reviewed this patient's available data, including medical history, events of note, physical examination, and all test results as part of my evaluation.  CBC: Recent Labs  Lab 03/05/20 0058 03/05/20 0457 03/06/20 0915 03/07/20 0425  WBC 7.2 7.2 7.0 7.3  NEUTROABS 3.5 3.1 3.9 3.4  HGB 11.8* 11.5* 12.7* 13.0  HCT 37.7* 35.7* 39.8 39.8  MCV 94.0 92.7 93.2 90.7  PLT 258 275 236 361   Basic Metabolic Panel: Recent Labs  Lab 03/05/20 0058 03/05/20 0457 03/05/20 1711 03/06/20 0500 03/07/20 0425  NA 141 140  --  136 142  K 3.2* 3.1* 4.2 4.9 3.9  CL 110 109  --  107 106  CO2 26 24   --  22 29  GLUCOSE 37* 69*  --  356* 98  BUN <5* 5*  --  7 12  CREATININE 0.78 0.78  --  0.81 0.87  CALCIUM 9.0 9.0  --  8.6* 9.2  MG  --  1.7 1.8 1.7 2.0  PHOS  --   --   --  2.6 3.6   GFR: Estimated Creatinine Clearance: 157.2 mL/min (by C-G formula based on SCr of 0.87 mg/dL). Liver Function Tests: Recent Labs  Lab 03/05/20 0058 03/05/20 0457 03/06/20 0500 03/07/20 0425  AST 65* 53* 42* 34  ALT 47* 41 37 37  ALKPHOS 88 83 81 98  BILITOT 0.4 0.4 0.4 0.6  PROT 6.7 6.1* 5.5* 6.6  ALBUMIN 3.9 3.4* 3.1* 3.5   No results for input(s): LIPASE, AMYLASE in the last 168 hours. No results for input(s): AMMONIA in the last 168 hours. Coagulation Profile: No results for input(s): INR, PROTIME in the last 168 hours. Cardiac  Enzymes: No results for input(s): CKTOTAL, CKMB, CKMBINDEX, TROPONINI in the last 168 hours. BNP (last 3 results) No results for input(s): PROBNP in the last 8760 hours. HbA1C: No results for input(s): HGBA1C in the last 72 hours. CBG: Recent Labs  Lab 03/06/20 2347 03/07/20 0412 03/07/20 0739 03/07/20 0823 03/07/20 1135  GLUCAP 134* 88 332* 310* 186*   Lipid Profile: Recent Labs    03/05/20 1711  CHOL 214*  HDL 45  LDLCALC 153*  TRIG 81  CHOLHDL 4.8   Thyroid Function Tests: Recent Labs    03/05/20 0457 03/05/20 1711  TSH 18.433*  --   FREET4  --  0.60*  T3FREE  --  3.4   Anemia Panel: Recent Labs    03/06/20 0915  VITAMINB12 185  FOLATE 10.2  FERRITIN 20*  TIBC 340  IRON 56  RETICCTPCT 1.3   Sepsis Labs: No results for input(s): PROCALCITON, LATICACIDVEN in the last 168 hours.  Recent Results (from the past 240 hour(s))  SARS CORONAVIRUS 2 (TAT 6-24 HRS) Nasopharyngeal Nasopharyngeal Swab     Status: None   Collection Time: 03/05/20  2:16 AM   Specimen: Nasopharyngeal Swab  Result Value Ref Range Status   SARS Coronavirus 2 NEGATIVE NEGATIVE Final    Comment: (NOTE) SARS-CoV-2 target nucleic acids are NOT DETECTED. The  SARS-CoV-2 RNA is generally detectable in upper and lower respiratory specimens during the acute phase of infection. Negative results do not preclude SARS-CoV-2 infection, do not rule out co-infections with other pathogens, and should not be used as the sole basis for treatment or other patient management decisions. Negative results must be combined with clinical observations, patient history, and epidemiological information. The expected result is Negative. Fact Sheet for Patients: HairSlick.no Fact Sheet for Healthcare Providers: quierodirigir.com This test is not yet approved or cleared by the [place] FDA and  has been authorized for detection and/or diagnosis of SARS-CoV-2 by FDA under an Emergency Use Authorization (EUA). This EUA will remain  in effect (meaning this test can be used) for the duration of the COVID-19 declaration under Section 56 4(b)(1) of the Act, 21 U.S.C. section 360bbb-3(b)(1), unless the authorization is terminated or revoked sooner. Performed at [name], [address], [place], [place] [address]          Radiology Studies: No results found.      Scheduled Meds: . atorvastatin  20 mg Oral Daily  . divalproex  500 mg Oral Q12H  . enoxaparin (LOVENOX) injection  0.5 mg/kg Subcutaneous Q24H  . escitalopram  10 mg Oral Daily  . insulin aspart  0-20 Units Subcutaneous Q4H  . insulin detemir  30 Units Subcutaneous Daily  . levothyroxine  50 mcg Oral Q0600  . QUEtiapine  100 mg Oral QHS  . traZODone  50 mg Oral QHS   Continuous Infusions:    LOS: 2 days    Time spent:40 min    Azeez Dunker, Roselind Messier, MD [facility] Pager [phone]  If 7PM-7AM, please contact night-coverage [email or web address] Password [email or web address] 03/07/2020, 1:03 PM

## 2020-03-07 NOTE — Progress Notes (Signed)
Inpatient Diabetes Program Recommendations  AACE/ADA: New Consensus Statement on Inpatient Glycemic Control (2015)  Target Ranges:  Prepandial:   less than 140 mg/dL      Peak postprandial:   less than 180 mg/dL (1-2 hours)      Critically ill patients:  140 - 180 mg/dL   Lab Results  Component Value Date   GLUCAP 186 (H) 03/07/2020   HGBA1C 8.8 (H) 02/03/2020    Review of Glycemic Control  Diabetes history: DM1 Outpatient Diabetes medications: 70/30 35 units bid, Novolog 0-12 units tidwc (pt reports not taking) Current orders for Inpatient glycemic control: Levemir 30 units QD, Novolog 0-20 units Q4H   Inpatient Diabetes Program Recommendations:     Decrease Levemir to 25 units QD Decrease Novolog to 0-9 units tidwc and hs (pt is Type 1 and sensitive to insulin) Add meal coverage - 5 units tidwc if pt eating > 50% meal  Continue to monitor and follow closely.  Thank you. Ailene Ards, RD, LDN, CDE Inpatient Diabetes Coordinator [phone]

## 2020-03-08 LAB — T3, FREE: T3, Free: 2.2 pg/mL (ref 2.0–4.4)

## 2020-03-08 LAB — GLUCOSE, CAPILLARY
Glucose-Capillary: 152 mg/dL — ABNORMAL HIGH (ref 70–99)
Glucose-Capillary: 65 mg/dL — ABNORMAL LOW (ref 70–99)
Glucose-Capillary: 93 mg/dL (ref 70–99)
Glucose-Capillary: 288 mg/dL — ABNORMAL HIGH (ref 70–99)
Glucose-Capillary: 178 mg/dL — ABNORMAL HIGH (ref 70–99)
Glucose-Capillary: 223 mg/dL — ABNORMAL HIGH (ref 70–99)

## 2020-03-08 MED ORDER — GABAPENTIN 300 MG PO CAPS
300.0000 mg | ORAL_CAPSULE | Freq: Every day | ORAL | Status: AC
  Administered 2020-03-09: 300 mg via ORAL
  Filled 2020-03-08: qty 1

## 2020-03-08 MED ORDER — INSULIN DETEMIR 100 UNIT/ML ~~LOC~~ SOLN
25.0000 [IU] | Freq: Every day | SUBCUTANEOUS | Status: DC
  Administered 2020-03-08 – 2020-03-09 (×2): 25 [IU] via SUBCUTANEOUS
  Filled 2020-03-08 (×3): qty 0.25

## 2020-03-08 NOTE — Consult Note (Signed)
Telepsych Consultation   Reason for Consult:  "again placed psychiatry consult for patient who states I want to kill myself" Referring Physician:  Dr. 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 [facility])   Noncompliance   Suicidal ideation   Bipolar I disorder, most recent episode depressed, severe without psychotic features (HCC)   Marijuana abuse   Tobacco dependence   Seizure disorder (HCC)   Uncontrolled type 1 diabetes mellitus with hypoglycemia without coma, with long-term current use of insulin (HCC)   Hypothyroidism   Diabetic polyneuropathy associated with type 1 diabetes mellitus (HCC)   Drug overdose   Drug abuse (HCC)   Attempted suicide (HCC)   Total Time spent with patient: 1 hour  Subjective: "I lost my mom 3 years ago and my dad is not doing that well, I do not know why I am here."   HPI:  Mark [name] is a 34 year old male admitted after intentional overdose on insulin with the plan to complete suicide.  Patient reports stressor includes recent loss of his mother as well as father who is "not doing well." Reports suicidal ideations today, patient reports suicidal ideations times approximately 3 months.  Patient reports history of 6 prior suicide attempts over his lifetime.  Patient reports plan to overdose on insulin.  Patient denies homicidal ideations.  Patient denies auditory visual hallucinations.  Patient denies symptoms of paranoia. Patient reports she lives in [place] with a roommate.  Patient denies access to weapons.  Patient reports he moved from [place] to [place]  approximately 7 months ago.   Patient reports seldom uses alcohol.  Patient reports use of marijuana approximately twice per week.  Patient currently not employed outside of home, patient states "I am trying to proceed with my disability." Patient reports history of bipolar depression, patient reports not taking medications times "at least 2 months."  Patient reports history of effective treatment with Seroquel and Paxil in the past.  Patient denies current outpatient psychiatrist, denies current outpatient therapist. Patient reports appetite is "hit and miss, patient reports sleep, "I do not sleep well." Patient denies any person to contact to collect collateral information.   Past Psychiatric History: Bipolar disorder, depression  Risk to Self:  Endorses suicidal ideations with plan Risk to Others:  Denies Prior Inpatient Therapy:  "3 times" unable to recall name of facility Prior Outpatient Therapy:  Outpatient treatment in [place] approximately 7 months ago, no current outpatient treatment  Past Medical History:  Past Medical History:  Diagnosis Date  . Bipolar disorder (HCC)   . Depression   . Diabetes mellitus without complication (HCC)   . Diabetic polyneuropathy associated with type 1 diabetes mellitus (HCC)   . Hydrocephalus (HCC)   . Kidney stones   . Seizures (HCC)     Past Surgical History:  Procedure Laterality Date  . KIDNEY STONE SURGERY     Family History:  Family History  Problem Relation Age of Onset  . Breast cancer Mother    Family Psychiatric  History: None reported Social History:  Social History   Substance and Sexual Activity  Alcohol Use Never   Comment: occassional      Social History  Substance and Sexual Activity  Drug Use Yes  . Types: Marijuana   Comment: smoked 2-3 days ago    Social History   Socioeconomic History  . Marital status: Single    Spouse name: Not on file  . Number of children: Not on file  . Years of education: Not on  file  . Highest education level: Not on file  Occupational History  . Not on file  Tobacco Use  . Smoking status: Current Every Day Smoker    Packs/day: 0.75    Years: 20.00    Pack years: 15.00    Types: Cigarettes  . Smokeless tobacco: Never Used  Substance and Sexual Activity  . Alcohol use: Never    Comment: occassional   . Drug use: Yes    Types: Marijuana    Comment: smoked 2-3 days ago  . Sexual activity: Not on file  Other Topics Concern  . Not on file  Social History Narrative  . Not on file   Social Determinants of Health   Financial Resource Strain:   . Difficulty of Paying Living Expenses:   Food Insecurity:   . Worried About Programme researcher, broadcasting/film/video in the Last Year:   . Barista in the Last Year:   Transportation Needs:   . Freight forwarder (Medical):   Marland Kitchen Lack of Transportation (Non-Medical):   Physical Activity:   . Days of Exercise per Week:   . Minutes of Exercise per Session:   Stress:   . Feeling of Stress :   Social Connections:   . Frequency of Communication with Friends and Family:   . Frequency of Social Gatherings with Friends and Family:   . Attends Religious Services:   . Active Member of Clubs or Organizations:   . Attends Banker Meetings:   Marland Kitchen Marital Status:    Additional Social History:    Allergies:   Allergies  Allergen Reactions  . Mushroom Extract Complex Hives, Itching and Nausea And Vomiting  . Penicillins Anaphylaxis, Hives and Swelling    Has patient had a PCN reaction causing immediate rash, facial/tongue/throat swelling, SOB or lightheadedness with hypotension: Yes Has patient had a PCN reaction causing severe rash involving mucus membranes or skin necrosis: No Has patient had a PCN reaction that required hospitalization: Yes Has patient had a PCN reaction occurring within the last 10 years: Yes If all of the above answers are "NO", then may proceed with Cephalosporin use.  . Sulfa Antibiotics  Anaphylaxis and Hives  . Clindamycin/Lincomycin Hives    Labs:  Results for orders placed or performed during the hospital encounter of 03/05/20 (from the past 48 hour(s))  CBG monitoring, ED     Status: Abnormal   Collection Time: 03/06/20 12:11 PM  Result Value Ref Range   Glucose-Capillary 434 (H) 70 - 99 mg/dL    Comment: Glucose reference range applies only to samples taken after fasting for at least 8 hours.  Glucose, capillary     Status: Abnormal   Collection Time: 03/06/20  1:13 PM  Result Value Ref Range   Glucose-Capillary 375 (H) 70 - 99 mg/dL    Comment: Glucose reference range applies only to samples taken after fasting for at least 8 hours.  Glucose, capillary     Status: Abnormal   Collection Time: 03/06/20  4:19 PM  Result Value Ref Range   Glucose-Capillary 68 (L) 70 - 99 mg/dL    Comment: Glucose reference range applies only  to samples taken after fasting for at least 8 hours.  Glucose, capillary     Status: Abnormal   Collection Time: 03/06/20  4:55 PM  Result Value Ref Range   Glucose-Capillary 122 (H) 70 - 99 mg/dL    Comment: Glucose reference range applies only to samples taken after fasting for at least 8 hours.  Glucose, capillary     Status: Abnormal   Collection Time: 03/06/20  8:03 PM  Result Value Ref Range   Glucose-Capillary 229 (H) 70 - 99 mg/dL    Comment: Glucose reference range applies only to samples taken after fasting for at least 8 hours.  Glucose, capillary     Status: Abnormal   Collection Time: 03/06/20 11:47 PM  Result Value Ref Range   Glucose-Capillary 134 (H) 70 - 99 mg/dL    Comment: Glucose reference range applies only to samples taken after fasting for at least 8 hours.  Glucose, capillary     Status: None   Collection Time: 03/07/20  4:12 AM  Result Value Ref Range   Glucose-Capillary 88 70 - 99 mg/dL    Comment: Glucose reference range applies only to samples taken after fasting for at least 8 hours.  Comprehensive metabolic  panel     Status: None   Collection Time: 03/07/20  4:25 AM  Result Value Ref Range   Sodium 142 135 - 145 mmol/L   Potassium 3.9 3.5 - 5.1 mmol/L    Comment: DELTA CHECK NOTED   Chloride 106 98 - 111 mmol/L   CO2 29 22 - 32 mmol/L   Glucose, Bld 98 70 - 99 mg/dL    Comment: Glucose reference range applies only to samples taken after fasting for at least 8 hours.   BUN 12 6 - 20 mg/dL   Creatinine, Ser 2.59 0.61 - 1.24 mg/dL   Calcium 9.2 8.9 - 56.3 mg/dL   Total Protein 6.6 6.5 - 8.1 g/dL   Albumin 3.5 3.5 - 5.0 g/dL   AST 34 15 - 41 U/L   ALT 37 0 - 44 U/L   Alkaline Phosphatase 98 38 - 126 U/L   Total Bilirubin 0.6 0.3 - 1.2 mg/dL   GFR calc non Af Amer >60 >60 mL/min   GFR calc Af Amer >60 >60 mL/min   Anion gap 7 5 - 15    Comment: Performed at [facility], [address], [place], [place] [address]  CBC with Differential/Platelet     Status: None   Collection Time: 03/07/20  4:25 AM  Result Value Ref Range   WBC 7.3 4.0 - 10.5 K/uL   RBC 4.39 4.22 - 5.81 MIL/uL   Hemoglobin 13.0 13.0 - 17.0 g/dL   HCT 33.2 95.1 - 88.4 %   MCV 90.7 80.0 - 100.0 fL   MCH 29.6 26.0 - 34.0 pg   MCHC 32.7 30.0 - 36.0 g/dL   RDW 16.6 06.3 - 01.6 %   Platelets 256 150 - 400 K/uL   nRBC 0.0 0.0 - 0.2 %   Neutrophils Relative % 46 %   Neutro Abs 3.4 1.7 - 7.7 K/uL   Lymphocytes Relative 39 %   Lymphs Abs 2.9 0.7 - 4.0 K/uL   Monocytes Relative 7 %   Monocytes Absolute 0.5 0.1 - 1.0 K/uL   Eosinophils Relative 7 %   Eosinophils Absolute 0.5 0.0 - 0.5 K/uL   Basophils Relative 1 %   Basophils Absolute 0.0 0.0 - 0.1 K/uL  Immature Granulocytes 0 %   Abs Immature Granulocytes 0.03 0.00 - 0.07 K/uL    Comment: Performed at [facility], [place] [address], [place], [place] [address]  Magnesium     Status: None   Collection Time: 03/07/20  4:25 AM  Result Value Ref Range   Magnesium 2.0 1.7 - 2.4 mg/dL    Comment: Performed at [facility], [place] [address] [place], [place] [address]  Phosphorus     Status: None   Collection Time: 03/07/20  4:25 AM  Result Value Ref Range   Phosphorus 3.6 2.5 - 4.6 mg/dL    Comment: Performed at [facility], [place] [address], [place], [place] [address]  T3, free     Status: None   Collection Time: 03/07/20  4:25 AM  Result Value Ref Range   T3, Free 2.2 2.0 - 4.4 pg/mL    Comment: (NOTE) Performed At: [facility] [place], [place] [id number] Rush Farmer MD [id number]   Glucose, capillary     Status: Abnormal   Collection Time: 03/07/20  7:39 AM  Result Value Ref Range   Glucose-Capillary 332 (H) 70 - 99 mg/dL    Comment: Glucose reference range applies only to samples taken after fasting for at least 8 hours.  Glucose, capillary     Status: Abnormal   Collection Time: 03/07/20  8:23 AM  Result Value Ref Range   Glucose-Capillary 310 (H) 70 - 99 mg/dL    Comment: Glucose reference range applies only to samples taken after fasting for at least 8 hours.   Comment 1 Notify RN    Comment 2 Document in Chart   Glucose, capillary     Status: Abnormal   Collection Time: 03/07/20 11:35 AM  Result Value Ref Range   Glucose-Capillary 186 (H) 70 - 99 mg/dL    Comment: Glucose reference range applies only to samples taken after fasting for at least 8 hours.  Glucose, capillary     Status: Abnormal   Collection Time: 03/07/20  4:39 PM  Result Value Ref Range   Glucose-Capillary 121 (H) 70 - 99 mg/dL    Comment: Glucose reference range applies only to samples taken after fasting for at least 8 hours.  Glucose, capillary     Status: Abnormal   Collection Time: 03/07/20  8:00 PM  Result Value Ref Range   Glucose-Capillary 126 (H) 70 - 99 mg/dL    Comment: Glucose reference range applies only to samples taken after fasting for at least 8 hours.  Glucose, capillary     Status: Abnormal   Collection Time: 03/07/20 11:56 PM  Result Value  Ref Range   Glucose-Capillary 190 (H) 70 - 99 mg/dL    Comment: Glucose reference range applies only to samples taken after fasting for at least 8 hours.  Glucose, capillary     Status: Abnormal   Collection Time: 03/08/20  4:02 AM  Result Value Ref Range   Glucose-Capillary 152 (H) 70 - 99 mg/dL    Comment: Glucose reference range applies only to samples taken after fasting for at least 8 hours.  Glucose, capillary     Status: Abnormal   Collection Time: 03/08/20  7:31 AM  Result Value Ref Range   Glucose-Capillary 65 (L) 70 - 99 mg/dL    Comment: Glucose reference range applies only to samples taken after fasting for at least 8 hours.  Glucose, capillary     Status: None  Collection Time: 03/08/20  8:01 AM  Result Value Ref Range   Glucose-Capillary 93 70 - 99 mg/dL    Comment: Glucose reference range applies only to samples taken after fasting for at least 8 hours.    Medications:  Current Facility-Administered Medications  Medication Dose Route Frequency Provider Last Rate Last Admin  . acetaminophen (TYLENOL) tablet 650 mg  650 mg Oral Q6H PRN Drema Dallas, MD   650 mg at 03/08/20 1010  . atorvastatin (LIPITOR) tablet 20 mg  20 mg Oral Daily Drema Dallas, MD   20 mg at 03/08/20 1011  . dextrose 50 % solution 50 mL  50 mL Intravenous 5 X Daily PRN Eduard Clos, MD   50 mL at 03/05/20 0905  . divalproex (DEPAKOTE) DR tablet 500 mg  500 mg Oral Q12H Raymon Mutton F, NP   500 mg at 03/08/20 1011  . enoxaparin (LOVENOX) injection 55 mg  0.5 mg/kg Subcutaneous Q24H Hessie Knows, RPH   55 mg at 03/07/20 2142  . escitalopram (LEXAPRO) tablet 10 mg  10 mg Oral Daily Raymon Mutton F, NP   10 mg at 03/08/20 1011  . insulin aspart (novoLOG) injection 0-20 Units  0-20 Units Subcutaneous Q4H Drema Dallas, MD   4 Units at 03/08/20 [phone]  . insulin detemir (LEVEMIR) injection 25 Units  25 Units Subcutaneous Daily Drema Dallas, MD   25 Units at 03/08/20 1012  .  levothyroxine (SYNTHROID) tablet 50 mcg  50 mcg Oral Q0600 Drema Dallas, MD   50 mcg at 03/08/20 [phone]  . melatonin tablet 6 mg  6 mg Oral QHS PRN Marikay Alar, FNP   6 mg at 03/07/20 2326  . QUEtiapine (SEROQUEL) tablet 100 mg  100 mg Oral QHS Raymon Mutton F, NP   100 mg at 03/07/20 2143  . traZODone (DESYREL) tablet 50 mg  50 mg Oral QHS Raymon Mutton F, NP   50 mg at 03/07/20 2143    Musculoskeletal: Strength & Muscle Tone: within normal limits Gait & Station: normal Patient leans: N/A  Psychiatric Specialty Exam: Physical Exam  Nursing note and vitals reviewed. Constitutional: He is oriented to person, place, and time. He appears well-developed.  HENT:  Head: Normocephalic.  Cardiovascular: Normal rate.  Respiratory: Effort normal.  Musculoskeletal:        General: Normal range of motion.     Cervical back: Normal range of motion.  Neurological: He is alert and oriented to person, place, and time.  Psychiatric: His speech is normal and behavior is normal. Judgment normal. Cognition and memory are normal. He exhibits a depressed mood. He expresses suicidal ideation. He expresses suicidal plans.    Review of Systems  Constitutional: Negative.   HENT: Negative.   Eyes: Negative.   Respiratory: Negative.   Cardiovascular: Negative.   Gastrointestinal: Negative.   Genitourinary: Negative.   Musculoskeletal: Negative.   Skin: Negative.   Neurological: Negative.   Psychiatric/Behavioral: Positive for sleep disturbance and suicidal ideas.    Blood pressure 109/71, pulse 67, temperature 98.4 F (36.9 C), temperature source Oral, resp. rate 20, height  (1.88 m), weight 113.4 kg, SpO2 98 %.Body mass index is 32.1 kg/m.  General Appearance: Casual  Eye Contact:  Fair  Speech:  Clear and Coherent and Normal Rate  Volume:  Normal  Mood:  Depressed  Affect:  Depressed  Thought Process:  Coherent, Goal Directed and Descriptions of Associations: Intact  Orientation:   Full (Time, Place,  and Person)  Thought Content:  Logical  Suicidal Thoughts:  Yes.  with intent/plan  Homicidal Thoughts:  No  Memory:  Immediate;   Good Recent;   Good Remote;   Good  Judgement:  Fair  Insight:  Lacking  Psychomotor Activity:  Normal  Concentration:  Concentration: Good and Attention Span: Good  Recall:  Good  Fund of Knowledge:  Fair  Language:  Fair  Akathisia:  No  Handed:  Right  AIMS (if indicated):     Assets:  Communication Skills Desire for Improvement Financial Resources/Insurance Housing Intimacy Leisure Time Physical Health Resilience Social Support  ADL's:  Intact  Cognition:  WNL  Sleep:        Treatment Plan Summary: Patient discussed with Dr. Lucianne MussKumar.  Patient is a 34 year old male admitted after attempted to commit suicide by overdosing on prescribed insulin.  Patient reports worsening depression and unable to contract for safety.  Based on my assessment today, patient will benefit from inpatient psychiatric admission for stabilization.  Recommendations: Continue one-to-one continuous observation sitter for safety. Consider social work consult to facilitate inpatient psychiatric admission once medically clear. Consider placing patient under IVC if he refuses voluntary inpatient admission. Recommend consider placing patient on Paxil 10 mg daily by mouth and Seroquel 25 mg nightly by mouth to address depression and insomnia.  Disposition: Recommend psychiatric Inpatient admission when medically cleared. Supportive therapy provided about ongoing stressors. Discussed crisis plan, support from social network, calling 911, coming to the Emergency Department, and calling Suicide Hotline.  This service was provided via telemedicine using a 2-way, interactive audio and video technology.  Names of all persons participating in this telemedicine service and their role in this encounter. Name: Tereasa CoopMichael Landstrom Role: Patient  Name: Berneice Heinrichina Tate Role:  FNP  Name: Dr Lucianne MussKumar Role: Psychiatrist    Patrcia Dollyina L Tate, FNP 03/08/2020 10:47 AM

## 2020-03-08 NOTE — Plan of Care (Signed)
  Problem: Education: Goal: Knowledge of General Education information will improve Description: Including pain rating scale, medication(s)/side effects and non-pharmacologic comfort measures Outcome: Progressing   Problem: Health Behavior/Discharge Planning: Goal: Ability to manage health-related needs will improve Outcome: Progressing   Problem: Clinical Measurements: Goal: Ability to maintain clinical measurements within normal limits will improve Outcome: Progressing Goal: Will remain free from infection Outcome: Progressing Goal: Diagnostic test results will improve Outcome: Progressing Goal: Respiratory complications will improve Outcome: Completed/Met Goal: Cardiovascular complication will be avoided Outcome: Completed/Met   Problem: Activity: Goal: Risk for activity intolerance will decrease Outcome: Completed/Met   Problem: Nutrition: Goal: Adequate nutrition will be maintained Outcome: Progressing   Problem: Coping: Goal: Level of anxiety will decrease Outcome: Progressing   Problem: Elimination: Goal: Will not experience complications related to bowel motility Outcome: Progressing Goal: Will not experience complications related to urinary retention Outcome: Completed/Met   Problem: Pain Managment: Goal: General experience of comfort will improve Outcome: Progressing   Problem: Safety: Goal: Ability to remain free from injury will improve Outcome: Progressing   Problem: Skin Integrity: Goal: Risk for impaired skin integrity will decrease Outcome: Progressing

## 2020-03-08 NOTE — TOC Initial Note (Signed)
Transition of Care [facility]) - Initial/Assessment Note    Patient Details  Name: Mark [name] MRN: [id number] Date of Birth: 11/10/1985  [facility] CM/SW Contact:    Dessa Phi, RN Phone Number: 03/08/2020, 3:02 PM  Clinical Narrative: Psych IVC;IP Psych @ d/c. IVC ends 5/22. [place] aware & following. [place] IP Psych attempting to fax w/difficulty-CMA following on fax.                  Expected Discharge Plan: Psychiatric Hospital Barriers to Discharge: Continued Medical Work up   Patient Goals and CMS Choice Patient states their goals for this hospitalization and ongoing recovery are:: get help   Choice offered to / list presented to : NA  Expected Discharge Plan and Services Expected Discharge Plan: [facility]   Discharge Planning Services: CM Consult   Living arrangements for the past 2 months: Apartment                                      Prior Living Arrangements/Services Living arrangements for the past 2 months: Apartment Lives with:: Roommate Patient language and need for interpreter reviewed:: Yes Do you feel safe going back to the place where you live?: Yes      Need for Family Participation in Patient Care: No (Comment) Care giver support system in place?: Yes (comment)   Criminal Activity/Legal Involvement Pertinent to Current Situation/Hospitalization: No - Comment as needed  Activities of Daily Living Home Assistive Devices/Equipment: CBG Meter ADL Screening (condition at time of admission) Patient's cognitive ability adequate to safely complete daily activities?: No Is the patient deaf or have difficulty hearing?: Yes Does the patient have difficulty seeing, even when wearing glasses/contacts?: No Does the patient have difficulty concentrating, remembering, or making decisions?: No Patient able to express need for assistance with ADLs?: Yes Does the patient have difficulty dressing or bathing?:  No Independently performs ADLs?: Yes (appropriate for developmental age) Does the patient have difficulty walking or climbing stairs?: No Weakness of Legs: None Weakness of Arms/Hands: None  Permission Sought/Granted Permission sought to share information with : Case Manager Permission granted to share information with : Yes, Verbal Permission Granted  Share Information with NAME: Case Manager  Permission granted to share info w AGENCY: Psych Facilities  Permission granted to share info w Relationship: Friend heather daniels [phone]     Emotional Assessment Appearance:: Appears stated age Attitude/Demeanor/Rapport: Gracious Affect (typically observed): Accepting Orientation: : Oriented to Self, Oriented to Place, Oriented to  Time, Oriented to Situation Alcohol / Substance Use: Illicit Drugs Psych Involvement: Yes (comment)  Admission diagnosis:  Drug overdose [T50.901A] Hypoglycemia [E16.2] Attempted suicide ([place]) [T14.91XA] Drug overdose, intentional ([place]) [T50.902A] Insulin overdose, intentional self-harm, initial encounter [facility]) [T38.3X2A] Patient Active Problem List   Diagnosis Date Noted  . Drug overdose, intentional ([place]) 03/05/2020  . Drug overdose 03/05/2020  . Drug abuse ([place]) 03/05/2020  . Attempted suicide ([place]) 03/05/2020  . Insulin overdose   . Hypoglycemia 02/03/2020  . Hypothyroidism 02/02/2020  . Diabetic polyneuropathy associated with type 1 diabetes mellitus ([place]) 02/02/2020  . Uncontrolled type 1 diabetes mellitus with hypoglycemia without coma, with long-term current use of insulin ([place]) 12/30/2019  . DKA (diabetic ketoacidosis) ([place]) 12/29/2019  . Homelessness 12/29/2019  . Marijuana abuse 12/29/2019  . Tobacco dependence 12/29/2019  . Seizure disorder ([place]) 12/29/2019  . Suicidal ideation   .  Bipolar I disorder, most recent episode depressed, severe without psychotic features (HCC)   . Adjustment disorder with depressed mood 12/07/2017  .  Noncompliance 10/17/2017  . Dysthymia 10/08/2017  . 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 Obstructive hydrocephalus (HCC) 09/19/2017  . MDD (major depressive disorder) 08/22/2017  . Severe recurrent major depression with psychotic features (HCC) 08/19/2017  . Abdominal pain 08/18/2017  . Hyponatremia 08/18/2017  . DKA (diabetic ketoacidoses) (HCC) 11/24/2015  . Diabetic keto-acidosis (HCC) 10/23/2015   PCP:  Patient, No Pcp Per Pharmacy:   [name] [place], [place] - [address] [name] [address] Phone: [phone] Fax: [phone]     Social Determinants of Health (SDOH) Interventions    Readmission Risk Interventions No flowsheet data found.

## 2020-03-08 NOTE — Progress Notes (Signed)
PROGRESS NOTE    [name]  SEG:[id number] DOB: 06-10-1986 DOA: 03/05/2020 PCP: Patient, No Pcp Per     Brief Narrative:  Mark [name] is a 34 y.o. WM PMHx DM Type 1 uncontrolled with complication,Bipolar disorder was admitted last month for DKA, drug abuse  intentionally overdosed himself with his NovoLog 70/30 which he usually takes 30 units 3 times daily with taking 80 units along with Levemir 30 units somewhere around after his dinner tonight.  Patient states he was depressed and wanted to kill himself and thus why he did this but after doing this he called the police and patient was brought here.  Denies taking any other medications.  Patient has not been taking any of his psychiatric medication for last 1 month.  ED Course: In the ER patient blood sugar initially in the 60s dropped down to around 27 he had to be started on D10 and D50 ampules Poison control at this time recommended supportive care with D10 and D50 as necessary.  Patient's acetaminophen level salicylate level valproic acid levels were negative.  Labs show creatinine 1.7 AST 65 ALT 47 hemoglobin 11.8 platelets 258 EKG normal sinus rhythm with acceptable QTC.  Covid test is pending.   Subjective: 5/18 A/O x4, negative CP, negative S OB, negative abdominal pain.  States wants to kill himself.  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 [facility])   Noncompliance   Suicidal ideation   Bipolar I disorder, most recent episode depressed, severe without psychotic features (HCC)   Marijuana abuse   Tobacco dependence   Seizure disorder (HCC)   Uncontrolled type 1 diabetes mellitus with hypoglycemia without coma, with long-term current use of insulin (HCC)   Hypothyroidism   Diabetic polyneuropathy  associated with type 1 diabetes mellitus (HCC)   Drug overdose   Drug abuse (HCC)   Attempted suicide (HCC)  Suicide attempt/intentional overdose -Intentional overdose with insulin -5/15 psychiatry consulted -5/17 again placed psychiatry consult for patient who states I want to kill myself -5/18 again spoke with FNP Berneice Heinrich psychiatry, informed her that patient is ready for placement in behavioral health.  Await further recommendations on when patient can be discharged to inpatient behavioral health  Bipolar disorder 5/16 still awaiting psychiatry recommendation on medication.  Considering starting patient on lithium  Hypokalemia -Potassium goal> 4  Hypomagnesmia -Magnesium goal> 2  DM type I uncontrolled with complication -4/14 hemoglobin A1c= 8.8 -5/16 Levemir 30 units daily -5/16 resistant SSI -Lipid panel pending -Sensitive SSI  HLD -5/15 LDL= 153 -5/13 Lipitor 20 mg daily  Hypoglycemia -Resolved  Drug abuse  -5/15 urine tox screen positive marijuana - Hypothyroidism -TSH 18.4 -5/15 free T4 low 0.60 -5/16 free T3 pending -5/16 Synthroid 50 mcg daily -Watch for refeeding syndrome -Retest TSH in 6 to 8 weeks   Normocytic anemia -Anemia panel; mild normocytic anemia -occult blood pending      DVT prophylaxis: Lovenox Code Status: Full Family Communication:  Disposition Plan:  Status is: Inpatient    Dispo: The patient is from: Home              Anticipated d/c is to: Inpatient psychiatric facility              Anticipated d/c date is: May 25  Patient currently unstable      Consultants:    Procedures/Significant Events:    I have personally reviewed and interpreted all radiology studies and my findings are as above.  VENTILATOR SETTINGS:    Cultures   Antimicrobials:    Devices    LINES / TUBES:      Continuous Infusions:    Objective: Vitals:   03/07/20 1727 03/07/20 2129 03/08/20 0646 03/08/20 1229   BP: 124/83 131/84 109/71 125/66  Pulse: 66 66 67 64  Resp: 20   14  Temp: 98.1 F (36.7 C) 98.7 F (37.1 C) 98.4 F (36.9 C) 98.4 F (36.9 C)  TempSrc: Oral Oral Oral Oral  SpO2: 99% 99% 98% 99%  Weight:      Height:        Intake/Output Summary (Last 24 hours) at 03/08/2020 1409 Last data filed at 03/08/2020 1251 Gross per 24 hour  Intake 2280 ml  Output 1475 ml  Net 805 ml   Filed Weights   03/05/20 0019  Weight: 113.4 kg    Examination:  General: A/O x4, no acute respiratory distress Eyes: negative scleral hemorrhage, negative anisocoria, negative icterus ENT: Negative Runny nose, negative gingival bleeding, EXTREMELY poor dentation Neck:  Negative scars, masses, torticollis, lymphadenopathy, JVD Lungs: Clear to auscultation bilaterally without wheezes or crackles Cardiovascular: Regular rate and rhythm without murmur gallop or rub normal S1 and S2 Abdomen: negative abdominal pain, nondistended, positive soft, bowel sounds, no rebound, no ascites, no appreciable mass Extremities: No significant cyanosis, clubbing, or edema bilateral lower extremities Skin: Negative rashes, lesions, ulcers Psychiatric: Positive depression, positive anxiety, negative fatigue, negative mania  Central nervous system:  Cranial nerves II through XII intact, tongue/uvula midline, all extremities muscle strength 5/5, sensation intact throughout, negative dysarthria, negative expressive aphasia, negative receptive aphasia.  .     Data Reviewed: Care during the described time interval was provided by me .  I have reviewed this patient's available data, including medical history, events of note, physical examination, and all test results as part of my evaluation.  CBC: Recent Labs  Lab 03/05/20 0058 03/05/20 0457 03/06/20 0915 03/07/20 0425  WBC 7.2 7.2 7.0 7.3  NEUTROABS 3.5 3.1 3.9 3.4  HGB 11.8* 11.5* 12.7* 13.0  HCT 37.7* 35.7* 39.8 39.8  MCV 94.0 92.7 93.2 90.7  PLT 258 275 236  256   Basic Metabolic Panel: Recent Labs  Lab 03/05/20 0058 03/05/20 0457 03/05/20 1711 03/06/20 0500 03/07/20 0425  NA 141 140  --  136 142  K 3.2* 3.1* 4.2 4.9 3.9  CL 110 109  --  107 106  CO2 26 24  --  22 29  GLUCOSE 37* 69*  --  356* 98  BUN <5* 5*  --  7 12  CREATININE 0.78 0.78  --  0.81 0.87  CALCIUM 9.0 9.0  --  8.6* 9.2  MG  --  1.7 1.8 1.7 2.0  PHOS  --   --   --  2.6 3.6   GFR: Estimated Creatinine Clearance: 157.2 mL/min (by C-G formula based on SCr of 0.87 mg/dL). Liver Function Tests: Recent Labs  Lab 03/05/20 0058 03/05/20 0457 03/06/20 0500 03/07/20 0425  AST 65* 53* 42* 34  ALT 47* 41 37 37  ALKPHOS 88 83 81 98  BILITOT 0.4 0.4 0.4 0.6  PROT 6.7 6.1* 5.5* 6.6  ALBUMIN 3.9 3.4* 3.1* 3.5   No results for input(s): LIPASE, AMYLASE in the last 168 hours. No results for  input(s): AMMONIA in the last 168 hours. Coagulation Profile: No results for input(s): INR, PROTIME in the last 168 hours. Cardiac Enzymes: No results for input(s): CKTOTAL, CKMB, CKMBINDEX, TROPONINI in the last 168 hours. BNP (last 3 results) No results for input(s): PROBNP in the last 8760 hours. HbA1C: No results for input(s): HGBA1C in the last 72 hours. CBG: Recent Labs  Lab 03/07/20 2356 03/08/20 0402 03/08/20 0731 03/08/20 0801 03/08/20 1145  GLUCAP 190* 152* 65* 93 288*   Lipid Profile: Recent Labs    03/05/20 1711  CHOL 214*  HDL 45  LDLCALC 153*  TRIG 81  CHOLHDL 4.8   Thyroid Function Tests: Recent Labs    03/05/20 1711 03/05/20 1711 03/07/20 0425  FREET4 0.60*  --   --   T3FREE 3.4   < > 2.2   < > = values in this interval not displayed.   Anemia Panel: Recent Labs    03/06/20 0915  VITAMINB12 185  FOLATE 10.2  FERRITIN 20*  TIBC 340  IRON 56  RETICCTPCT 1.3   Sepsis Labs: No results for input(s): PROCALCITON, LATICACIDVEN in the last 168 hours.  Recent Results (from the past 240 hour(s))  SARS CORONAVIRUS 2 (TAT 6-24 HRS)  Nasopharyngeal Nasopharyngeal Swab     Status: None   Collection Time: 03/05/20  2:16 AM   Specimen: Nasopharyngeal Swab  Result Value Ref Range Status   SARS Coronavirus 2 NEGATIVE NEGATIVE Final    Comment: (NOTE) SARS-CoV-2 target nucleic acids are NOT DETECTED. The SARS-CoV-2 RNA is generally detectable in upper and lower respiratory specimens during the acute phase of infection. Negative results do not preclude SARS-CoV-2 infection, do not rule out co-infections with other pathogens, and should not be used as the sole basis for treatment or other patient management decisions. Negative results must be combined with clinical observations, patient history, and epidemiological information. The expected result is Negative. Fact Sheet for Patients: SugarRoll.be Fact Sheet for Healthcare Providers: https://www.Shyan Scalisi-mathews.com/ This test is not yet approved or cleared by the [place] FDA and  has been authorized for detection and/or diagnosis of SARS-CoV-2 by FDA under an Emergency Use Authorization (EUA). This EUA will remain  in effect (meaning this test can be used) for the duration of the COVID-19 declaration under Section 56 4(b)(1) of the Act, 21 U.S.C. section 360bbb-3(b)(1), unless the authorization is terminated or revoked sooner. Performed at [facility], [place] [address] [place], [place] [address]          Radiology Studies: No results found.      Scheduled Meds: . atorvastatin  20 mg Oral Daily  . divalproex  500 mg Oral Q12H  . enoxaparin (LOVENOX) injection  0.5 mg/kg Subcutaneous Q24H  . escitalopram  10 mg Oral Daily  . insulin aspart  0-20 Units Subcutaneous Q4H  . insulin detemir  25 Units Subcutaneous Daily  . levothyroxine  50 mcg Oral Q0600  . QUEtiapine  100 mg Oral QHS  . traZODone  50 mg Oral QHS   Continuous Infusions:    LOS: 3 days    Time spent:40 min    Jennifermarie Franzen, Geraldo Docker,  MD [facility] Pager [phone]  If 7PM-7AM, please contact night-coverage [email or web address] Password [facility] 03/08/2020, 2:09 PM

## 2020-03-08 NOTE — Progress Notes (Signed)
Hypoglycemic Event  CBG: 65  Treatment: 4 oz juice/soda  Symptoms: Nervous/irritable  Follow-up CBG: Time:0801 CBG Result:93  Possible Reasons for Event: Unknown  Comments/MD notified: Dr. Lia Foyer, Joyice Faster

## 2020-03-09 LAB — CBC WITH DIFFERENTIAL/PLATELET
Abs Immature Granulocytes: 0.03 10*3/uL (ref 0.00–0.07)
Basophils Absolute: 0.1 10*3/uL (ref 0.0–0.1)
Basophils Relative: 1 %
Eosinophils Absolute: 0.5 10*3/uL (ref 0.0–0.5)
Eosinophils Relative: 7 %
HCT: 49.6 % (ref 39.0–52.0)
Hemoglobin: 16.3 g/dL (ref 13.0–17.0)
Immature Granulocytes: 0 %
Lymphocytes Relative: 30 %
Lymphs Abs: 2.1 10*3/uL (ref 0.7–4.0)
MCH: 29.3 pg (ref 26.0–34.0)
MCHC: 32.9 g/dL (ref 30.0–36.0)
MCV: 89.2 fL (ref 80.0–100.0)
Monocytes Absolute: 0.4 10*3/uL (ref 0.1–1.0)
Monocytes Relative: 6 %
Neutro Abs: 3.8 10*3/uL (ref 1.7–7.7)
Neutrophils Relative %: 56 %
Platelets: 164 10*3/uL (ref 150–400)
RBC: 5.56 MIL/uL (ref 4.22–5.81)
RDW: 15.3 % (ref 11.5–15.5)
WBC: 6.9 10*3/uL (ref 4.0–10.5)
nRBC: 0 % (ref 0.0–0.2)

## 2020-03-09 LAB — GLUCOSE, CAPILLARY
Glucose-Capillary: 136 mg/dL — ABNORMAL HIGH (ref 70–99)
Glucose-Capillary: 136 mg/dL — ABNORMAL HIGH (ref 70–99)
Glucose-Capillary: 197 mg/dL — ABNORMAL HIGH (ref 70–99)
Glucose-Capillary: 243 mg/dL — ABNORMAL HIGH (ref 70–99)
Glucose-Capillary: 275 mg/dL — ABNORMAL HIGH (ref 70–99)
Glucose-Capillary: 98 mg/dL (ref 70–99)

## 2020-03-09 LAB — COMPREHENSIVE METABOLIC PANEL
ALT: 28 U/L (ref 0–44)
AST: 27 U/L (ref 15–41)
Albumin: 3.4 g/dL — ABNORMAL LOW (ref 3.5–5.0)
Alkaline Phosphatase: 97 U/L (ref 38–126)
Anion gap: 11 (ref 5–15)
BUN: 16 mg/dL (ref 6–20)
CO2: 26 mmol/L (ref 22–32)
Calcium: 8.8 mg/dL — ABNORMAL LOW (ref 8.9–10.3)
Chloride: 99 mmol/L (ref 98–111)
Creatinine, Ser: 0.95 mg/dL (ref 0.61–1.24)
GFR calc Af Amer: >60 mL/min (ref >60)
GFR calc non Af Amer: >60 mL/min (ref >60)
Glucose, Bld: 279 mg/dL — ABNORMAL HIGH (ref 70–99)
Potassium: 5 mmol/L (ref 3.5–5.1)
Sodium: 136 mmol/L (ref 135–145)
Total Bilirubin: 0.7 mg/dL (ref 0.3–1.2)
Total Protein: 6.4 g/dL — ABNORMAL LOW (ref 6.5–8.1)

## 2020-03-09 LAB — MAGNESIUM: Magnesium: 1.7 mg/dL (ref 1.7–2.4)

## 2020-03-09 LAB — PHOSPHORUS: Phosphorus: 3.4 mg/dL (ref 2.5–4.6)

## 2020-03-09 MED ORDER — PREGABALIN 75 MG PO CAPS
150.0000 mg | ORAL_CAPSULE | Freq: Two times a day (BID) | ORAL | Status: DC
  Administered 2020-03-09 – 2020-03-13 (×9): 150 mg via ORAL
  Filled 2020-03-09 (×9): qty 2

## 2020-03-09 MED ORDER — INSULIN ASPART 100 UNIT/ML ~~LOC~~ SOLN
0.0000 [IU] | Freq: Three times a day (TID) | SUBCUTANEOUS | Status: DC
  Administered 2020-03-09: 2 [IU] via SUBCUTANEOUS
  Administered 2020-03-10: 9 [IU] via SUBCUTANEOUS
  Administered 2020-03-10: 2 [IU] via SUBCUTANEOUS
  Administered 2020-03-11: 9 [IU] via SUBCUTANEOUS
  Administered 2020-03-11 (×2): 3 [IU] via SUBCUTANEOUS
  Administered 2020-03-12: 5 [IU] via SUBCUTANEOUS
  Administered 2020-03-12 – 2020-03-13 (×3): 2 [IU] via SUBCUTANEOUS

## 2020-03-09 MED ORDER — INSULIN ASPART 100 UNIT/ML ~~LOC~~ SOLN
4.0000 [IU] | Freq: Three times a day (TID) | SUBCUTANEOUS | Status: DC
  Administered 2020-03-09: 4 [IU] via SUBCUTANEOUS

## 2020-03-09 NOTE — Progress Notes (Signed)
PROGRESS NOTE    MERICK KELLEHER  ZOX:[id number] DOB: 02-Oct-1986 DOA: 03/05/2020 PCP: Patient, No Pcp Per   Chef Complaints: Drug overdose  Brief Narrative: 34 year old male with history of type 1 diabetes mellitus, bipolar disorder, peripheral neuropathy admitted last month for DKA intentionally overdosed himself with his NovoLog 70/30 which he takes 30 units 3 times a day and took 18 units along with Levemir 30 units as patient was depressed and wanted to kill himself but after doing that he called the police and patient was brought to the ED, sugar initially in 60s dropped to around 27 and was placed on dextrose, on extensive blood work-with creatinine 1.7 AST 65, ALT 47 hemoglobin 9.8, platelet 250 8K EKG normal sinus rhythm. Patient was admitted for further management follow-up/consulted by psychiatry. At this time patient alert and oriented seen by psychiatry, only inpatient psychiatric unit.  His blood sugar is fairly stable insulin is adjusted today further.  Subjective: Seen and examined this morning patient is requesting to restart his Neurontin/Lyrica back on first peripheral neuropathy. Denies nausea vomiting fever chills.   Assessment & Plan: Drug overdose, intentional for suicide attempt: Patient alert awake oriented, seen by psychiatry, he is on IVC, awaiting for placement to inpatient psychiatric unit.  Patient is on divalproex, Celexa, Seroquel and trazodone.  Continue per psychiatry.  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  Marijuana abuse/Tobacco dependence: Cessation advised.  Uncontrolled type 1 diabetes mellitus with hypoglycemia without coma, with long-term current use of insulin with polyneuropathy: Initial hypoglycemia due to his insulin overdose.  Reports he takes 70/30 insulin 30 units 3 times daily and 30 insulin at bedtime.  Blood sugar fairly controlled on  Levemir 25 units daily and changoe to 0-1 units sliding scale insulin, add 4 units tid with meals  appreciate diabetic coordinator input. Change to achs. Resume lyrica. Recent Labs  Lab 03/08/20 2009 03/09/20 0003 03/09/20 0355 03/09/20 0722 03/09/20 1130  GLUCAP 223* 136* 136* 98 243*   Hypothyroidism: TSH elevated at 10.4 Free T4 0.6, Synthroid 50 mg daily to be continued and retest TSH in 6 weeks  Normocytic anemia:hemoglobin more or less stable,advised outpatient follow-up HLD- cont lipitor  DVT prophylaxis:lovenox Code Status: Full Family Communication: plan of care discussed with patient at bedside.  Status is: Inpatient Remains inpatient appropriate because:Unsafe d/c plan  Dispo: The patient is from: Home              Anticipated d/c is to: inpatient psych unit              Anticipated d/c date is: 1 day              Patient currently is medically stable to d/c.  Patient is medically stable for discharge to inpatient psychiatric unit  Diet Order            Diet Carb Modified Fluid consistency: Thin; Room service appropriate? Yes  Diet effective now            Consultants:see note  Procedures:see note Microbiology:see note  Medications: Scheduled Meds: . atorvastatin  20 mg Oral Daily  . divalproex  500 mg Oral Q12H  . enoxaparin (LOVENOX) injection  0.5 mg/kg Subcutaneous Q24H  . escitalopram  10 mg Oral Daily  . insulin aspart  0-20 Units Subcutaneous Q4H  . insulin detemir  25 Units Subcutaneous Daily  . levothyroxine  50 mcg Oral Q0600  . QUEtiapine  100 mg Oral QHS  . traZODone  50 mg Oral QHS   Continuous Infusions:  Antimicrobials: Anti-infectives (From admission, onward)   None       Objective: Vitals: Today's Vitals   03/09/20 0534 03/09/20 0956 03/09/20 1056 03/09/20 1253  BP: 124/74   111/61  Pulse: 66   64  Resp: 17   16  Temp: 97.8 F (36.6 C)   98.3 F (36.8 C)  TempSrc: Oral   Oral  SpO2: 99%   100%  Weight:      Height:       PainSc:  5  Asleep     Intake/Output Summary (Last 24 hours) at 03/09/2020 1319 Last data filed at 03/09/2020 1052 Gross per 24 hour  Intake 720 ml  Output 1350 ml  Net -630 ml   Filed Weights   03/05/20 0019  Weight: 113.4 kg   Weight change:    Intake/Output from previous day: 05/18 0701 - 05/19 0700 In: 1080 [P.O.:1080] Out: 1675 [Urine:1675] Intake/Output this shift: Total I/O In: 360 [P.O.:360] Out: 300 [Urine:300]  Examination:  General exam: AAO ,NAD, weak appearing. HEENT:Oral mucosa moist, Ear/Nose WNL grossly,dentition normal. Respiratory system: bilaterally clear,no wheezing or crackles,no use of accessory muscle, non tender. Cardiovascular system: S1 & S2 +, regular, No JVD. Gastrointestinal system: Abdomen soft, NT,ND, BS+. Nervous System:Alert, awake, moving extremities and grossly nonfocal Extremities: No edema, distal peripheral pulses palpable.  Skin: No rashes,no icterus. MSK: Normal muscle bulk,tone, power  Data Reviewed: I have personally reviewed following labs and imaging studies CBC: Recent Labs  Lab 03/05/20 0058 03/05/20 0457 03/06/20 0915 03/07/20 0425 03/09/20 1205  WBC 7.2 7.2 7.0 7.3 6.9  NEUTROABS 3.5 3.1 3.9 3.4 3.8  HGB 11.8* 11.5* 12.7* 13.0 16.3  HCT 37.7* 35.7* 39.8 39.8 49.6  MCV 94.0 92.7 93.2 90.7 89.2  PLT 258 275 236 256 164   Basic Metabolic Panel: Recent Labs  Lab 03/05/20 0058 03/05/20 0058 03/05/20 0457 03/05/20 1711 03/06/20 0500 03/07/20 0425 03/09/20 1205  NA 141  --  140  --  136 142 136  K 3.2*   < > 3.1* 4.2 4.9 3.9 5.0  CL 110  --  109  --  107 106 99  CO2 26  --  24  --  22 29 26   GLUCOSE 37*  --  69*  --  356* 98 279*  BUN <5*  --  5*  --  7 12 16   CREATININE 0.78  --  0.78  --  0.81 0.87 0.95  CALCIUM 9.0  --  9.0  --  8.6* 9.2 8.8*  MG  --   --  1.7 1.8 1.7 2.0 1.7  PHOS  --   --   --   --  2.6 3.6 3.4   < > = values in this interval not displayed.   GFR: Estimated Creatinine  Clearance: 144 mL/min (by C-G formula based on SCr of 0.95 mg/dL). Liver Function Tests: Recent Labs  Lab 03/05/20 0058 03/05/20 0457 03/06/20 0500 03/07/20 0425 03/09/20 1205  AST 65* 53* 42* 34 27  ALT 47* 41 37 37 28  ALKPHOS 88 83 81 98 97  BILITOT 0.4 0.4 0.4 0.6 0.7  PROT 6.7 6.1* 5.5* 6.6 6.4*  ALBUMIN 3.9 3.4* 3.1* 3.5 3.4*   No results for input(s): LIPASE, AMYLASE in the last 168 hours. No results for input(s): AMMONIA in the last 168 hours. Coagulation Profile: No results for input(s): INR, PROTIME in the  last 168 hours. Cardiac Enzymes: No results for input(s): CKTOTAL, CKMB, CKMBINDEX, TROPONINI in the last 168 hours. BNP (last 3 results) No results for input(s): PROBNP in the last 8760 hours. HbA1C: No results for input(s): HGBA1C in the last 72 hours. CBG: Recent Labs  Lab 03/08/20 2009 03/09/20 0003 03/09/20 0355 03/09/20 0722 03/09/20 1130  GLUCAP 223* 136* 136* 98 243*   Lipid Profile: No results for input(s): CHOL, HDL, LDLCALC, TRIG, CHOLHDL, LDLDIRECT in the last 72 hours. Thyroid Function Tests: Recent Labs    03/07/20 0425  T3FREE 2.2   Anemia Panel: No results for input(s): VITAMINB12, FOLATE, FERRITIN, TIBC, IRON, RETICCTPCT in the last 72 hours. Sepsis Labs: No results for input(s): PROCALCITON, LATICACIDVEN in the last 168 hours.  Recent Results (from the past 240 hour(s))  SARS CORONAVIRUS 2 (TAT 6-24 HRS) Nasopharyngeal Nasopharyngeal Swab     Status: None   Collection Time: 03/05/20  2:16 AM   Specimen: Nasopharyngeal Swab  Result Value Ref Range Status   SARS Coronavirus 2 NEGATIVE NEGATIVE Final    Comment: (NOTE) SARS-CoV-2 target nucleic acids are NOT DETECTED. The SARS-CoV-2 RNA is generally detectable in upper and lower respiratory specimens during the acute phase of infection. Negative results do not preclude SARS-CoV-2 infection, do not rule out co-infections with other pathogens, and should not be used as the sole  basis for treatment or other patient management decisions. Negative results must be combined with clinical observations, patient history, and epidemiological information. The expected result is Negative. Fact Sheet for Patients: HairSlick.no Fact Sheet for Healthcare Providers: quierodirigir.com This test is not yet approved or cleared by the [place] FDA and  has been authorized for detection and/or diagnosis of SARS-CoV-2 by FDA under an Emergency Use Authorization (EUA). This EUA will remain  in effect (meaning this test can be used) for the duration of the COVID-19 declaration under Section 56 4(b)(1) of the Act, 21 U.S.C. section 360bbb-3(b)(1), unless the authorization is terminated or revoked sooner. Performed at [facility], [address] [place], [place] [address]       Radiology Studies: No results found.   LOS: 4 days   Lanae Boast, MD [facility]  03/09/2020, 1:19 PM

## 2020-03-09 NOTE — TOC Progression Note (Signed)
Transition of Care [facility]) - Progression Note    Patient Details  Name: Mark [name] MRN: [id number] Date of Birth: December 25, 1985  [facility]) CM/SW Contact  Clois Treanor, Olegario Messier, RN Phone Number: 03/09/2020, 10:17 AM  Clinical Narrative:  Left messages w/[facility]-rep Sarah,& [facility] intake rep Andrea-await call back if able to accept.also awaiting medical stability. IVC-5/15-5/22.     Expected Discharge Plan: Psychiatric Hospital Barriers to Discharge: Continued Medical Work up  Expected Discharge Plan and Services Expected Discharge Plan: Psychiatric Hospital   Discharge Planning Services: CM Consult   Living arrangements for the past 2 months: Apartment                                       Social Determinants of Health (SDOH) Interventions    Readmission Risk Interventions No flowsheet data found.

## 2020-03-09 NOTE — TOC Progression Note (Signed)
Transition of Care [facility]) - Progression Note    Patient Details  Name: Mark [name] MRN: [id number] Date of Birth: [birth year]  Transition [facility] Care [facility]) CM/SW Contact  Lorine Iannaccone, Olegario Messier, RN Phone Number: 03/09/2020, 4:06 PM  Clinical Narrative: [facility]axed to [facility]-no beds today;[facility] beds, [facility] beds [facility], Faxed to [facility]-await response for [facility] Psych.      Expected Discharge Plan: Psychiatric Hospital Barriers to Discharge: Continued Medical Work up  Expected Discharge Plan and Services Expected Discharge Plan: Psychiatric Hospital   Discharge Planning Services: CM Consult   Living arrangements for the past 2 months: Apartment                                       Social Determinants of Health (SDOH) Interventions    Readmission Risk Interventions No flowsheet data found.

## 2020-03-09 NOTE — Plan of Care (Signed)
  Problem: Education: Goal: Knowledge of General Education information will improve Description: Including pain rating scale, medication(s)/side effects and non-pharmacologic comfort measures Outcome: Completed/Met   Problem: Health Behavior/Discharge Planning: Goal: Ability to manage health-related needs will improve Outcome: Progressing   Problem: Clinical Measurements: Goal: Ability to maintain clinical measurements within normal limits will improve Outcome: Progressing Goal: Will remain free from infection Outcome: Completed/Met Goal: Diagnostic test results will improve Outcome: Completed/Met   Problem: Nutrition: Goal: Adequate nutrition will be maintained Outcome: Completed/Met   Problem: Coping: Goal: Level of anxiety will decrease Outcome: Progressing   Problem: Elimination: Goal: Will not experience complications related to bowel motility Outcome: Completed/Met   Problem: Pain Managment: Goal: General experience of comfort will improve Outcome: Adequate for Discharge   Problem: Safety: Goal: Ability to remain free from injury will improve Outcome: Progressing   Problem: Skin Integrity: Goal: Risk for impaired skin integrity will decrease Outcome: Completed/Met

## 2020-03-10 DIAGNOSIS — E0865 Diabetes mellitus due to underlying condition with hyperglycemia: Secondary | ICD-10-CM

## 2020-03-10 LAB — GLUCOSE, CAPILLARY
Glucose-Capillary: 461 mg/dL — ABNORMAL HIGH (ref 70–99)
Glucose-Capillary: 184 mg/dL — ABNORMAL HIGH (ref 70–99)
Glucose-Capillary: 77 mg/dL (ref 70–99)
Glucose-Capillary: 235 mg/dL — ABNORMAL HIGH (ref 70–99)

## 2020-03-10 LAB — GLUCOSE, RANDOM: Glucose, Bld: 462 mg/dL — ABNORMAL HIGH (ref 70–99)

## 2020-03-10 MED ORDER — INSULIN DETEMIR 100 UNIT/ML ~~LOC~~ SOLN
30.0000 [IU] | Freq: Every day | SUBCUTANEOUS | Status: DC
  Administered 2020-03-10 – 2020-03-11 (×2): 30 [IU] via SUBCUTANEOUS
  Filled 2020-03-10 (×3): qty 0.3

## 2020-03-10 MED ORDER — POLYETHYLENE GLYCOL 3350 17 G PO PACK
17.0000 g | PACK | Freq: Every day | ORAL | Status: DC
  Administered 2020-03-10 – 2020-03-13 (×3): 17 g via ORAL
  Filled 2020-03-10 (×3): qty 1

## 2020-03-10 MED ORDER — INSULIN ASPART 100 UNIT/ML ~~LOC~~ SOLN
6.0000 [IU] | Freq: Three times a day (TID) | SUBCUTANEOUS | Status: DC
  Administered 2020-03-10 – 2020-03-12 (×6): 6 [IU] via SUBCUTANEOUS

## 2020-03-10 NOTE — Progress Notes (Signed)
Pt's CBG 461. STAT Glucose ordered. Dr. Jonathon Bellows paged and made aware.

## 2020-03-10 NOTE — Progress Notes (Signed)
PROGRESS NOTE    BENNET KUJAWA  KPT:[id number] DOB: [birth year] DOA: 03/05/2020 PCP: Patient, No Pcp Per   Chef Complaints: Drug overdose  Brief Narrative: 34 year old male with history of type 1 diabetes mellitus, bipolar disorder, peripheral neuropathy admitted last month for DKA intentionally overdosed himself with his NovoLog 70/30 which he takes 30 units 3 times a day and took 18 units along with Levemir 30 units as patient was depressed and wanted to kill himself but after doing that he called the police and patient was brought to the ED, sugar initially in 60s dropped to around 27 and was placed on dextrose, on extensive blood work-with creatinine 1.7 AST 65, ALT 47 hemoglobin 9.8, platelet 250 8K EKG normal sinus rhythm. Patient was admitted for further management follow-up/consulted by psychiatry. At this time patient alert and oriented seen by psychiatry, only inpatient psychiatric unit.  His blood sugar is fairly stable insulin is adjusted today further.  Subjective:  No acute events overnight. CBG this morning 461.   Assessment & Plan: Drug overdose intentional for suicide attempt: Seen by psychiatry awaiting on inpatient psych unit.  Continue current meds with divalproex, Celexa, Seroquel and trazodone.  Continue IVC per psychiatry.  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  Marijuana abuse/Tobacco dependence: Cessation advised.  Uncontrolled type 1 diabetes mellitus, initial hypoglycemia due to insulin overdose, currently blood pressure borderline controlled. Has polyneuropathy: Reports he takes 70/30 insulin 30 units 3 times daily and 30 insulin at bedtime. He is on Levemir 25 units daily and  On ssi, added 4 units tid with meals 5/19- increase to 30 un levemir  And 6 premeal. Cont achs, cont lyrica. Recent Labs  Lab 03/09/20 1130 03/09/20 1609 03/09/20 2125  03/10/20 0743 03/10/20 1152  GLUCAP 243* 197* 275* 461* 184*   Hypothyroidism: Continue Synthroid.  TSH elevated at 10.4 Free T4 0.6. Retest TSH in 6 weeks  Normocytic anemia:hemoglobin more or less stable,advised outpatient follow-up  Hyperlipidemia-continue Lipitor.  DVT prophylaxis:lovenox Code Status: Full Family Communication: plan of care discussed with patient at bedside.  Status is: Inpatient Remains inpatient appropriate because:Unsafe d/c plan  Dispo: The patient is from: Home              Anticipated d/c is to: inpatient psych unit              Anticipated d/c date is: 1 day              Patient currently is medically stable to d/c.  Patient is medically stable for discharge to inpatient psychiatric unit on current regimen.  Diet Order            Diet Carb Modified Fluid consistency: Thin; Room service appropriate? Yes  Diet effective now            Consultants:see note  Procedures:see note Microbiology:see note  Medications: Scheduled Meds: . atorvastatin  20 mg Oral Daily  . divalproex  500 mg Oral Q12H  . enoxaparin (LOVENOX) injection  0.5 mg/kg Subcutaneous Q24H  . escitalopram  10 mg Oral Daily  . insulin aspart  0-9 Units Subcutaneous TID WC  . insulin aspart  6 Units Subcutaneous TID WC  . insulin detemir  30 Units Subcutaneous Daily  . levothyroxine  50 mcg Oral Q0600  . polyethylene glycol  17 g Oral Daily  . pregabalin  150 mg Oral BID  . QUEtiapine  100 mg Oral QHS  .  traZODone  50 mg Oral QHS   Continuous Infusions:  Antimicrobials: Anti-infectives (From admission, onward)   None       Objective: Vitals: Today's Vitals   03/10/20 0600 03/10/20 0843 03/10/20 0947 03/10/20 1321  BP: 113/75   134/82  Pulse: 70   79  Resp: (!) 23  13 14   Temp: 98.4 F (36.9 C)   98 F (36.7 C)  TempSrc: Oral   Oral  SpO2: 98%   98%  Weight:      Height:      PainSc:  5       Intake/Output Summary (Last 24 hours) at 03/10/2020 1412 Last  data filed at 03/10/2020 1300 Gross per 24 hour  Intake 1080 ml  Output 3575 ml  Net -2495 ml   Filed Weights   03/05/20 0019  Weight: 113.4 kg   Weight change:    Intake/Output from previous day: 05/19 0701 - 05/20 0700 In: 600 [P.O.:600] Out: 3000 [Urine:3000] Intake/Output this shift: Total I/O In: 840 [P.O.:840] Out: 875 [Urine:875]  Examination:  General exam: AAOx3 , NAD, weak appearing. HEENT:Oral mucosa moist, Ear/Nose WNL grossly, dentition normal. Respiratory system: bilaterally clear,no wheezing or crackles,no use of accessory muscle Cardiovascular system: S1 & S2 +, No JVD,. Gastrointestinal system: Abdomen soft, NT,ND, BS+ Nervous System:Alert, awake, moving extremities and grossly nonfocal Extremities: No edema, distal peripheral pulses palpable.  Skin: No rashes,no icterus. MSK: Normal muscle bulk,tone, power   Data Reviewed: I have personally reviewed following labs and imaging studies CBC: Recent Labs  Lab 03/05/20 0058 03/05/20 0457 03/06/20 0915 03/07/20 0425 03/09/20 1205  WBC 7.2 7.2 7.0 7.3 6.9  NEUTROABS 3.5 3.1 3.9 3.4 3.8  HGB 11.8* 11.5* 12.7* 13.0 16.3  HCT 37.7* 35.7* 39.8 39.8 49.6  MCV 94.0 92.7 93.2 90.7 89.2  PLT 258 275 236 256 170   Basic Metabolic Panel: Recent Labs  Lab 03/05/20 0058 03/05/20 0058 03/05/20 0457 03/05/20 1711 03/06/20 0500 03/07/20 0425 03/09/20 1205 03/10/20 0814  NA 141  --  140  --  136 142 136  --   K 3.2*   < > 3.1* 4.2 4.9 3.9 5.0  --   CL 110  --  109  --  107 106 99  --   CO2 26  --  24  --  22 29 26   --   GLUCOSE 37*   < > 69*  --  356* 98 279* 462*  BUN <5*  --  5*  --  7 12 16   --   CREATININE 0.78  --  0.78  --  0.81 0.87 0.95  --   CALCIUM 9.0  --  9.0  --  8.6* 9.2 8.8*  --   MG  --   --  1.7 1.8 1.7 2.0 1.7  --   PHOS  --   --   --   --  2.6 3.6 3.4  --    < > = values in this interval not displayed.   GFR: Estimated Creatinine Clearance: 144 mL/min (by C-G formula based on SCr  of 0.95 mg/dL). Liver Function Tests: Recent Labs  Lab 03/05/20 0058 03/05/20 0457 03/06/20 0500 03/07/20 0425 03/09/20 1205  AST 65* 53* 42* 34 27  ALT 47* 41 37 37 28  ALKPHOS 88 83 81 98 97  BILITOT 0.4 0.4 0.4 0.6 0.7  PROT 6.7 6.1* 5.5* 6.6 6.4*  ALBUMIN 3.9 3.4* 3.1* 3.5 3.4*   No results for input(s): LIPASE,  AMYLASE in the last 168 hours. No results for input(s): AMMONIA in the last 168 hours. Coagulation Profile: No results for input(s): INR, PROTIME in the last 168 hours. Cardiac Enzymes: No results for input(s): CKTOTAL, CKMB, CKMBINDEX, TROPONINI in the last 168 hours. BNP (last 3 results) No results for input(s): PROBNP in the last 8760 hours. HbA1C: No results for input(s): HGBA1C in the last 72 hours. CBG: Recent Labs  Lab 03/09/20 1130 03/09/20 1609 03/09/20 2125 03/10/20 0743 03/10/20 1152  GLUCAP 243* 197* 275* 461* 184*   Lipid Profile: No results for input(s): CHOL, HDL, LDLCALC, TRIG, CHOLHDL, LDLDIRECT in the last 72 hours. Thyroid Function Tests: No results for input(s): TSH, T4TOTAL, FREET4, T3FREE, THYROIDAB in the last 72 hours. Anemia Panel: No results for input(s): VITAMINB12, FOLATE, FERRITIN, TIBC, IRON, RETICCTPCT in the last 72 hours. Sepsis Labs: No results for input(s): PROCALCITON, LATICACIDVEN in the last 168 hours.  Recent Results (from the past 240 hour(s))  SARS CORONAVIRUS 2 (TAT 6-24 HRS) Nasopharyngeal Nasopharyngeal Swab     Status: None   Collection Time: 03/05/20  2:16 AM   Specimen: Nasopharyngeal Swab  Result Value Ref Range Status   SARS Coronavirus 2 NEGATIVE NEGATIVE Final    Comment: (NOTE) SARS-CoV-2 target nucleic acids are NOT DETECTED. The SARS-CoV-2 RNA is generally detectable in upper and lower respiratory specimens during the acute phase of infection. Negative results do not preclude SARS-CoV-2 infection, do not rule out co-infections with other pathogens, and should not be used as the sole basis for  treatment or other patient management decisions. Negative results must be combined with clinical observations, patient history, and epidemiological information. The expected result is Negative. Fact Sheet for Patients: HairSlick.no Fact Sheet for Healthcare Providers: quierodirigir.com This test is not yet approved or cleared by the [place] FDA and  has been authorized for detection and/or diagnosis of SARS-CoV-2 by FDA under an Emergency Use Authorization (EUA). This EUA will remain  in effect (meaning this test can be used) for the duration of the COVID-19 declaration under Section 56 4(b)(1) of the Act, 21 U.S.C. section 360bbb-3(b)(1), unless the authorization is terminated or revoked sooner. Performed at [facility], [address], [place], [place] [address]       Radiology Studies: No results found.   LOS: 5 days   Lanae Boast, MD [facility]  03/10/2020, 2:12 PM

## 2020-03-10 NOTE — Plan of Care (Signed)
  Problem: Coping: Goal: Level of anxiety will decrease Outcome: Progressing   Problem: Pain Managment: Goal: General experience of comfort will improve Outcome: Progressing   Problem: Safety: Goal: Ability to remain free from injury will improve Outcome: Progressing   

## 2020-03-10 NOTE — TOC Progression Note (Signed)
Transition of Care [facility]) - Progression Note    Patient Details  Name: Mark [name] MRN: [id number] Date of Birth: [birth year]  [facility]) CM/SW Contact  Joni Norrod, Olegario Messier, RN Phone Number: 03/10/2020, 10:26 AM  Clinical Narrative:1:1 IVC ends 5/22-IP Psych.Not medically stable-insulin adjustments. Still following w/[facility]-left message,[facility]-left message,[facility]-awaiting response. Leadership also informed.     Expected Discharge Plan: Psychiatric Hospital Barriers to Discharge: Continued Medical Work up  Expected Discharge Plan and Services Expected Discharge Plan: Psychiatric Hospital   Discharge Planning Services: CM Consult   Living arrangements for the past 2 months: Apartment                                       Social Determinants of Health (SDOH) Interventions    Readmission Risk Interventions No flowsheet data found.

## 2020-03-11 LAB — GLUCOSE, CAPILLARY
Glucose-Capillary: 372 mg/dL — ABNORMAL HIGH (ref 70–99)
Glucose-Capillary: 228 mg/dL — ABNORMAL HIGH (ref 70–99)
Glucose-Capillary: 210 mg/dL — ABNORMAL HIGH (ref 70–99)
Glucose-Capillary: 250 mg/dL — ABNORMAL HIGH (ref 70–99)

## 2020-03-11 NOTE — Progress Notes (Signed)
Spoke with Olegario Messier about admission to [facility] and need for another psych consult.  Explained that Lional's blood sugars need to be under 350 for 24 hours prior to admission.  She reported that the IVC paperwork ends tomorrow morning at 8:30am.  Informed her that MD there will need to complete the IVC paperwork and they will need to put in another psych consult order.  Tentatively assigned to 304-1 pending blood sugars.

## 2020-03-11 NOTE — TOC Progression Note (Signed)
Transition of Care [facility]) - Progression Note    Patient Details  Name: Mark [name] MRN: [id number] Date of Birth: 04/11/1986  [facility]) CM/SW Contact  Anelisse Jacobson, Olegario Messier, RN Phone Number: 03/11/2020, 10:52 AM  Clinical Narrative:  Awaiting IP Psych bed-TC-[facility]-rep [facility]-rep [place];[facility]-left message;Faxed w/confirmation to [facility]-await response. [facility]-have beds available.    Expected Discharge Plan: Psychiatric Hospital Barriers to Discharge: Continued Medical Work up  Expected Discharge Plan and Services Expected Discharge Plan: Psychiatric Hospital   Discharge Planning Services: CM Consult   Living arrangements for the past 2 months: Apartment                                       Social Determinants of Health (SDOH) Interventions    Readmission Risk Interventions No flowsheet data found.

## 2020-03-11 NOTE — Progress Notes (Signed)
PROGRESS NOTE    Mark [name]  FAO:[id number] DOB: 08-Feb-1986 DOA: 03/05/2020 PCP: Patient, No Pcp Per   Chef Complaints: Overdose  Brief Narrative: 34 year old male with history of type 1 diabetes mellitus, bipolar disorder, peripheral neuropathy admitted last month for DKA intentionally overdosed himself with his NovoLog 70/30 which he takes 30 units 3 times a day and took 18 units along with Levemir 30 units as patient was depressed and wanted to kill himself but after doing that he called the police and patient was brought to the ED, sugar initially in 60s dropped to around 27 and was placed on dextrose, on extensive blood work-with creatinine 1.7 AST 65, ALT 47 hemoglobin 9.8, platelet 250 8K EKG normal sinus rhythm. Patient was admitted for further management follow-up/consulted by psychiatry. At this time patient alert and oriented seen by psychiatry, only inpatient psychiatric unit.  His blood sugar is fairly stable.  Subjective:  Resting comfortably asking to increase his Seroquel bedtime. Ambulating no new complaints.     Assessment & Plan: Drug overdose, intentional for suicide attempt insulin: Continues to have suicidal ideation ordered inpatient psych stabilization, appreciate psych input.  Psychiatry to see the patient on regular basis while he is admitted to manage his meds- pt is asking to increase his seroquel.Continue current meds with divalproex, Celexa, Seroquel and trazodone.  Continue IVC per psychiatry.  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  Marijuana/tobacco abuse: Cessation advised.    Type 1 diabetes mellitus, labile, initial hypoglycemia due to insulin overdose.  Also with neuropathy: he reports taking 70/30 insulin 30 units 3 times daily and 30 insulin at bedtime.  Continue current Levemir 30 units daily and 6 units premeal, cont ssi and monitor achs  and cont lyrica.  For behavioral psychiatry they need blood sugar <350/24 hrs. Recent Labs  Lab 03/10/20 1152 03/10/20 1620 03/10/20 2107 03/11/20 0732 03/11/20 1209  GLUCAP 184* 77 235* 372* 228*   Hypothyroidism: Continue Synthroid.TSH elevated at 10.4 Free T4 0.6. Retest TSH in 6 weeks  Normocytic anemia:hemoglobin more or less stable,advised outpatient follow-up  Hyperlipidemia-continue Lipitor.  DVT prophylaxis:lovenox Code Status: Full Family Communication: plan of care discussed with patient at bedside.  Status is: Inpatient Remains inpatient appropriate because:Unsafe d/c plan. Ongoing inpatient psychiatric need  Dispo: The patient is from: Home              Anticipated d/c is to: inpatient psych unit hopefully will abate tomorrow              Anticipated d/c date is: 1 day              Patient currently is medically stable to d/c.  Patient is medically stable for discharge to inpatient psychiatric unit on current regimen.  Diet Order            Diet Carb Modified Fluid consistency: Thin; Room service appropriate? Yes  Diet effective now            Consultants:see note  Procedures:see note Microbiology:see note  Medications: Scheduled Meds: . atorvastatin  20 mg Oral Daily  . divalproex  500 mg Oral Q12H  . enoxaparin (LOVENOX) injection  0.5 mg/kg Subcutaneous Q24H  . escitalopram  10 mg Oral Daily  . insulin aspart  0-9 Units Subcutaneous TID WC  . insulin aspart  6 Units Subcutaneous TID WC  . insulin detemir  30 Units Subcutaneous Daily  . levothyroxine  50 mcg Oral Q0600  . polyethylene glycol  17 g Oral Daily  . pregabalin  150 mg Oral BID  . QUEtiapine  100 mg Oral QHS  . traZODone  50 mg Oral QHS   Continuous Infusions:  Antimicrobials: Anti-infectives (From admission, onward)   None       Objective: Vitals: Today's Vitals   03/10/20 1321 03/10/20 2357 03/11/20 0619 03/11/20 1018  BP: 134/82  118/74   Pulse: 79  93   Resp: 14  18     Temp: 98 F (36.7 C)  99.6 F (37.6 C)   TempSrc: Oral  Oral   SpO2: 98%  99%   Weight:      Height:      PainSc:  7   6     Intake/Output Summary (Last 24 hours) at 03/11/2020 1442 Last data filed at 03/11/2020 0500 Gross per 24 hour  Intake 1200 ml  Output 1100 ml  Net 100 ml   Filed Weights   03/05/20 0019  Weight: 113.4 kg   Weight change:    Intake/Output from previous day: 05/20 0701 - 05/21 0700 In: 2280 [P.O.:2280] Out: 1975 [LKGMW:1027] Intake/Output this shift: No intake/output data recorded.  Examination:  General exam: AAOx3, disheavled, on room air. HEENT:Oral mucosa moist, Ear/Nose WNL grossly, dentition normal. Respiratory system: bilaterally clear,no wheezing or crackles,no use of accessory muscle Cardiovascular system: S1 & S2 +, No JVD,. Gastrointestinal system: Abdomen soft, NT,ND, BS+ Nervous System:Alert, awake, moving extremities and grossly nonfocal Extremities: No edema, distal peripheral pulses palpable.  Skin: No rashes,no icterus. MSK: Normal muscle bulk,tone, power  Data Reviewed: I have personally reviewed following labs and imaging studies CBC: Recent Labs  Lab 03/05/20 0058 03/05/20 0457 03/06/20 0915 03/07/20 0425 03/09/20 1205  WBC 7.2 7.2 7.0 7.3 6.9  NEUTROABS 3.5 3.1 3.9 3.4 3.8  HGB 11.8* 11.5* 12.7* 13.0 16.3  HCT 37.7* 35.7* 39.8 39.8 49.6  MCV 94.0 92.7 93.2 90.7 89.2  PLT 258 275 236 256 253   Basic Metabolic Panel: Recent Labs  Lab 03/05/20 0058 03/05/20 0058 03/05/20 0457 03/05/20 1711 03/06/20 0500 03/07/20 0425 03/09/20 1205 03/10/20 0814  NA 141  --  140  --  136 142 136  --   K 3.2*   < > 3.1* 4.2 4.9 3.9 5.0  --   CL 110  --  109  --  107 106 99  --   CO2 26  --  24  --  22 29 26   --   GLUCOSE 37*   < > 69*  --  356* 98 279* 462*  BUN <5*  --  5*  --  7 12 16   --   CREATININE 0.78  --  0.78  --  0.81 0.87 0.95  --   CALCIUM 9.0  --  9.0  --  8.6* 9.2 8.8*  --   MG  --   --  1.7 1.8 1.7 2.0 1.7   --   PHOS  --   --   --   --  2.6 3.6 3.4  --    < > = values in this interval not displayed.   GFR: Estimated Creatinine Clearance: 144 mL/min (by C-G formula based on SCr of 0.95 mg/dL). Liver Function Tests: Recent Labs  Lab 03/05/20 0058 03/05/20 0457 03/06/20 0500 03/07/20 0425 03/09/20 1205  AST 65* 53* 42* 34 27  ALT 47* 41 37 37 28  ALKPHOS 88 83 81 98 97  BILITOT 0.4 0.4 0.4 0.6 0.7  PROT 6.7 6.1* 5.5* 6.6 6.4*  ALBUMIN 3.9 3.4* 3.1* 3.5 3.4*   No results for input(s): LIPASE, AMYLASE in the last 168 hours. No results for input(s): AMMONIA in the last 168 hours. Coagulation Profile: No results for input(s): INR, PROTIME in the last 168 hours. Cardiac Enzymes: No results for input(s): CKTOTAL, CKMB, CKMBINDEX, TROPONINI in the last 168 hours. BNP (last 3 results) No results for input(s): PROBNP in the last 8760 hours. HbA1C: No results for input(s): HGBA1C in the last 72 hours. CBG: Recent Labs  Lab 03/10/20 1152 03/10/20 1620 03/10/20 2107 03/11/20 0732 03/11/20 1209  GLUCAP 184* 77 235* 372* 228*   Lipid Profile: No results for input(s): CHOL, HDL, LDLCALC, TRIG, CHOLHDL, LDLDIRECT in the last 72 hours. Thyroid Function Tests: No results for input(s): TSH, T4TOTAL, FREET4, T3FREE, THYROIDAB in the last 72 hours. Anemia Panel: No results for input(s): VITAMINB12, FOLATE, FERRITIN, TIBC, IRON, RETICCTPCT in the last 72 hours. Sepsis Labs: No results for input(s): PROCALCITON, LATICACIDVEN in the last 168 hours.  Recent Results (from the past 240 hour(s))  SARS CORONAVIRUS 2 (TAT 6-24 HRS) Nasopharyngeal Nasopharyngeal Swab     Status: None   Collection Time: 03/05/20  2:16 AM   Specimen: Nasopharyngeal Swab  Result Value Ref Range Status   SARS Coronavirus 2 NEGATIVE NEGATIVE Final    Comment: (NOTE) SARS-CoV-2 target nucleic acids are NOT DETECTED. The SARS-CoV-2 RNA is generally detectable in upper and lower respiratory specimens during the acute  phase of infection. Negative results do not preclude SARS-CoV-2 infection, do not rule out co-infections with other pathogens, and should not be used as the sole basis for treatment or other patient management decisions. Negative results must be combined with clinical observations, patient history, and epidemiological information. The expected result is Negative. Fact Sheet for Patients: HairSlick.no Fact Sheet for Healthcare Providers: quierodirigir.com This test is not yet approved or cleared by the [place] FDA and  has been authorized for detection and/or diagnosis of SARS-CoV-2 by FDA under an Emergency Use Authorization (EUA). This EUA will remain  in effect (meaning this test can be used) for the duration of the COVID-19 declaration under Section 56 4(b)(1) of the Act, 21 U.S.C. section 360bbb-3(b)(1), unless the authorization is terminated or revoked sooner. Performed at [facility], [address] [place], [place] [address]     Radiology Studies: No results found.   LOS: 6 days   Lanae Boast, MD [facility]  03/11/2020, 2:42 PM

## 2020-03-11 NOTE — TOC Progression Note (Signed)
Transition of Care [facility]) - Progression Note    Patient Details  Name: Mark [name] MRN: [id number] Date of Birth: Dec 22, 1985  [facility]) CM/SW Contact  Courtlyn Aki, Olegario Messier, RN Phone Number: 03/11/2020, 3:05 PM  Clinical Narrative:[facility] rep Jasmine/Heal[facility] spoke to attending-recc Psych cons be placed.       Expected Discharge Plan: Psychiatric Hospital Barriers to Discharge: Psych Bed not available  Expected Discharge Plan and Services Expected Discharge Plan: Psychiatric Hospital   Discharge Planning Services: CM Consult   Living arrangements for the past 2 months: Apartment                                       Social Determinants of Health (SDOH) Interventions    Readmission Risk Interventions No flowsheet data found.

## 2020-03-11 NOTE — TOC Progression Note (Signed)
Transition of Care [facility]) - Progression Note    Patient Details  Name: Mark [name] MRN: [id number] Date of Birth: July 20, 1986  [facility]) CM/SW Contact  Mahabir, Olegario Messier, RN Phone Number: 03/11/2020, 4:27 PM  Clinical Narrative:IVC-5/21-5/28- Received IVC papers from magistrate-TC [employer] to serve the patient-Secy will make 4 copies once signed by [employer]-then put them in IVC packet along with affidavit(4 copies).Await Psych eval.      Expected Discharge Plan: Psychiatric Hospital Barriers to Discharge: Psych Bed not available  Expected Discharge Plan and Services Expected Discharge Plan: Psychiatric Hospital   Discharge Planning Services: CM Consult   Living arrangements for the past 2 months: Apartment                                       Social Determinants of Health (SDOH) Interventions    Readmission Risk Interventions No flowsheet data found.

## 2020-03-11 NOTE — TOC Progression Note (Signed)
Transition of Care [facility]) - Progression Note    Patient Details  Name: NEHEMIAH MCFARREN MRN: [id number] Date of Birth: 1986/02/21  Transition of Care [facility]) CM/SW Contact  Calandria Mullings, Olegario Messier, RN Phone Number: 03/11/2020, 2:54 PM  Clinical Narrative: IVC ends 5/22-CM faxed to Magistrate for updated IVC-await response. Per [place]-denied d/t malingering from past hx.      Expected Discharge Plan: Psychiatric Hospital Barriers to Discharge: Psych Bed not available  Expected Discharge Plan and Services Expected Discharge Plan: Psychiatric Hospital   Discharge Planning Services: CM Consult   Living arrangements for the past 2 months: Apartment                                       Social Determinants of Health (SDOH) Interventions    Readmission Risk Interventions No flowsheet data found.

## 2020-03-12 LAB — GLUCOSE, CAPILLARY
Glucose-Capillary: 453 mg/dL — ABNORMAL HIGH (ref 70–99)
Glucose-Capillary: 405 mg/dL — ABNORMAL HIGH (ref 70–99)
Glucose-Capillary: 258 mg/dL — ABNORMAL HIGH (ref 70–99)
Glucose-Capillary: 167 mg/dL — ABNORMAL HIGH (ref 70–99)
Glucose-Capillary: 171 mg/dL — ABNORMAL HIGH (ref 70–99)

## 2020-03-12 MED ORDER — INSULIN ASPART 100 UNIT/ML ~~LOC~~ SOLN
12.0000 [IU] | Freq: Once | SUBCUTANEOUS | Status: AC
  Administered 2020-03-12: 12 [IU] via SUBCUTANEOUS

## 2020-03-12 MED ORDER — INSULIN ASPART 100 UNIT/ML ~~LOC~~ SOLN
10.0000 [IU] | Freq: Three times a day (TID) | SUBCUTANEOUS | Status: DC
  Administered 2020-03-12 – 2020-03-13 (×4): 10 [IU] via SUBCUTANEOUS

## 2020-03-12 MED ORDER — INSULIN ASPART 100 UNIT/ML ~~LOC~~ SOLN: 8.0000 [IU] | Freq: Three times a day (TID) | SUBCUTANEOUS | Status: DC

## 2020-03-12 MED ORDER — INSULIN DETEMIR 100 UNIT/ML ~~LOC~~ SOLN
35.0000 [IU] | Freq: Every day | SUBCUTANEOUS | Status: DC
  Administered 2020-03-12 – 2020-03-13 (×2): 35 [IU] via SUBCUTANEOUS
  Filled 2020-03-12 (×2): qty 0.35

## 2020-03-12 NOTE — Progress Notes (Signed)
Patient is continuing to refuse to wear telemetry monitor.  Patient states " I am not going to wear it, I dont see the need for it."  Dr. Dayna Barker made aware and discontinued order for cardiac monitoring.

## 2020-03-12 NOTE — Progress Notes (Signed)
Pt stated that he "had an accident last night and soaked the bed"   Writer asked pt if this happens often he stated " it happens Curator asked if he feels the urge to go. Pt stated "I dont feel the urge to go" Pt also expressed this only happens at night

## 2020-03-12 NOTE — Consult Note (Signed)
Patient awaiting placement in [facility] adult unit: RM 304-01 pending CBG reading under 350  Thedore Mins, MD

## 2020-03-12 NOTE — Progress Notes (Signed)
PROGRESS NOTE    ADD DINAPOLI  VQQ:[id number] DOB: 03/28/1986 DOA: 03/05/2020 PCP: Patient, No Pcp Per   Chef Complaints: Overdose  Brief Narrative: 34 year old male with history of type 1 diabetes mellitus, bipolar disorder, peripheral neuropathy admitted last month for DKA intentionally overdosed himself with his NovoLog 70/30 which he takes 30 units 3 times a day and took 18 units along with Levemir 30 units as patient was depressed and wanted to kill himself but after doing that he called the police and patient was brought to the ED, sugar initially in 60s dropped to around 27 and was placed on dextrose, on extensive blood work-with creatinine 1.7 AST 65, ALT 47 hemoglobin 9.8, platelet 250 8K EKG normal sinus rhythm. Patient was admitted for further management follow-up/consulted by psychiatry. At this time patient alert and oriented seen by psychiatry, only inpatient psychiatric unit.  His blood sugar is fairly stable.  Subjective:  Resting comfortably asking to increase his Seroquel bedtime. Ambulating no new complaints.   Blood sugar in 400 this morning patient had peanut butter at this bedside and reviewed with the nursing staff.  Patient was also asking for extra meals omelette  Assessment & Plan:  Drug overdose with insulin, intentional for suicidal attempt : Continues to have suicidal ideation.  Awaiting for inpatient psychiatric bed once blood sugar is stable.  Continue on current psych meds with divalproex, Celexa, Seroquel and trazodone.  Continue IVC per psychiatry.  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  Marijuana/tobacco abuse: Cessation advised.    Type 1 diabetes mellitus labile with uncontrolled hyperglycemia.  Initial hypoglycemia due to insulin overdose.  Also with associated neuropathy. reports taking 70/30 insulin 30 units 3 times daily and 30  insulin at bedtime.  Blood sugar poorly controlled likely from dietary indiscretion, continue on a strict diabetic diet, increase Lantus to 35 units and premeal 10 units TID and ssi.  Patient reports his blood sugar will 140s to 300 at home normally.For behavioral psychiatry they need blood sugar <350/24 hrs. Recent Labs  Lab 03/11/20 1209 03/11/20 1626 03/11/20 2305 03/12/20 0730 03/12/20 0935  GLUCAP 228* 210* 250* 453* 405*   Hypothyroidism: Continue Synthroid. TSH elevated at 10.4 Free T4 0.6. Retest TSH in 6 weeks  Normocytic anemia:hemoglobin more or less stable,advised outpatient follow-up  Hyperlipidemia-continue Lipitor.  DVT prophylaxis:lovenox Code Status: Full Family Communication: plan of care discussed with patient at bedside.  Status is: Inpatient  Remains inpatient appropriate because:Persistent severe electrolyte disturbances and Due to uncontrolled hyperglycemia to adjust his insulin.  Dispo: The patient is from: Home              Anticipated d/c is to: Inpatient psychiatric unit              Anticipated d/c date is: 1 day              Patient currently is not medically stable to d/c.  Diet Order            Diet Carb Modified Fluid consistency: Thin; Room service appropriate? Yes  Diet effective now            Consultants:see note  Procedures:see note Microbiology:see note  Medications: Scheduled Meds: . atorvastatin  20 mg Oral Daily  . divalproex  500 mg Oral Q12H  . enoxaparin (LOVENOX) injection  0.5 mg/kg Subcutaneous Q24H  . escitalopram  10 mg Oral Daily  . insulin aspart  0-9 Units Subcutaneous TID WC  . insulin aspart  10 Units Subcutaneous TID WC  . insulin detemir  35 Units Subcutaneous Daily  . levothyroxine  50 mcg Oral Q0600  . polyethylene glycol  17 g Oral Daily  . pregabalin  150 mg Oral BID  . QUEtiapine  100 mg Oral QHS  . traZODone  50 mg Oral QHS   Continuous Infusions:  Antimicrobials: Anti-infectives (From admission,  onward)   None       Objective: Vitals: Today's Vitals   03/10/20 2357 03/11/20 0619 03/11/20 1018 03/11/20 2357  BP:  118/74    Pulse:  93    Resp:  18    Temp:  99.6 F (37.6 C)    TempSrc:  Oral    SpO2:  99%    Weight:      Height:      PainSc: 7   6  6      Intake/Output Summary (Last 24 hours) at 03/12/2020 1043 Last data filed at 03/12/2020 0557 Gross per 24 hour  Intake 480 ml  Output 1000 ml  Net -520 ml   Filed Weights   03/05/20 0019  Weight: 113.4 kg   Weight change:    Intake/Output from previous day: 05/21 0701 - 05/22 0700 In: 480 [P.O.:480] Out: 1000 [Urine:1000] Intake/Output this shift: No intake/output data recorded.  Examination:  General exam: AAOx3, NAD, weak appearing. HEENT:Oral mucosa moist, Ear/Nose WNL grossly, dentition normal. Respiratory system: bilaterally c;ear,no wheezing or crackles,no use of accessory muscle Cardiovascular system: S1 & S2 +, No JVD,. Gastrointestinal system: Abdomen soft, NT,ND, BS+ Nervous System:Alert, awake, moving extremities and grossly nonfocal Extremities: No edema, distal peripheral pulses palpable.  Skin: No rashes,no icterus. MSK: Normal muscle bulk,tone, power   Data Reviewed: I have personally reviewed following labs and imaging studies CBC: Recent Labs  Lab 03/06/20 0915 03/07/20 0425 03/09/20 1205  WBC 7.0 7.3 6.9  NEUTROABS 3.9 3.4 3.8  HGB 12.7* 13.0 16.3  HCT 39.8 39.8 49.6  MCV 93.2 90.7 89.2  PLT 236 256 164   Basic Metabolic Panel: Recent Labs  Lab 03/05/20 1711 03/06/20 0500 03/07/20 0425 03/09/20 1205 03/10/20 0814  NA  --  136 142 136  --   K 4.2 4.9 3.9 5.0  --   CL  --  107 106 99  --   CO2  --  22 29 26   --   GLUCOSE  --  356* 98 279* 462*  BUN  --  7 12 16   --   CREATININE  --  0.81 0.87 0.95  --   CALCIUM  --  8.6* 9.2 8.8*  --   MG 1.8 1.7 2.0 1.7  --   PHOS  --  2.6 3.6 3.4  --    GFR: Estimated Creatinine Clearance: 144 mL/min (by C-G formula based  on SCr of 0.95 mg/dL). Liver Function Tests: Recent Labs  Lab 03/06/20 0500 03/07/20 0425 03/09/20 1205  AST 42* 34 27  ALT 37 37 28  ALKPHOS 81 98 97  BILITOT 0.4 0.6 0.7  PROT 5.5* 6.6 6.4*  ALBUMIN 3.1* 3.5 3.4*   No results for input(s): LIPASE, AMYLASE in the last 168 hours. No results for input(s): AMMONIA in the last 168 hours. Coagulation Profile: No results for input(s): INR, PROTIME in the last 168 hours. Cardiac Enzymes: No results for input(s): CKTOTAL, CKMB, CKMBINDEX, TROPONINI in the last 168 hours. BNP (last 3 results) No results for input(s): PROBNP in the  last 8760 hours. HbA1C: No results for input(s): HGBA1C in the last 72 hours. CBG: Recent Labs  Lab 03/11/20 1209 03/11/20 1626 03/11/20 2305 03/12/20 0730 03/12/20 0935  GLUCAP 228* 210* 250* 453* 405*   Lipid Profile: No results for input(s): CHOL, HDL, LDLCALC, TRIG, CHOLHDL, LDLDIRECT in the last 72 hours. Thyroid Function Tests: No results for input(s): TSH, T4TOTAL, FREET4, T3FREE, THYROIDAB in the last 72 hours. Anemia Panel: No results for input(s): VITAMINB12, FOLATE, FERRITIN, TIBC, IRON, RETICCTPCT in the last 72 hours. Sepsis Labs: No results for input(s): PROCALCITON, LATICACIDVEN in the last 168 hours.  Recent Results (from the past 240 hour(s))  SARS CORONAVIRUS 2 (TAT 6-24 HRS) Nasopharyngeal Nasopharyngeal Swab     Status: None   Collection Time: 03/05/20  2:16 AM   Specimen: Nasopharyngeal Swab  Result Value Ref Range Status   SARS Coronavirus 2 NEGATIVE NEGATIVE Final    Comment: (NOTE) SARS-CoV-2 target nucleic acids are NOT DETECTED. The SARS-CoV-2 RNA is generally detectable in upper and lower respiratory specimens during the acute phase of infection. Negative results do not preclude SARS-CoV-2 infection, do not rule out co-infections with other pathogens, and should not be used as the sole basis for treatment or other patient management decisions. Negative results must  be combined with clinical observations, patient history, and epidemiological information. The expected result is Negative. Fact Sheet for Patients: HairSlick.no Fact Sheet for Healthcare Providers: quierodirigir.com This test is not yet approved or cleared by the [place] FDA and  has been authorized for detection and/or diagnosis of SARS-CoV-2 by FDA under an Emergency Use Authorization (EUA). This EUA will remain  in effect (meaning this test can be used) for the duration of the COVID-19 declaration under Section 56 4(b)(1) of the Act, 21 U.S.C. section 360bbb-3(b)(1), unless the authorization is terminated or revoked sooner. Performed at [facility], [address], [place], [place] [address]     Radiology Studies: No results found.   LOS: 7 days   Lanae Boast, MD [facility]  03/12/2020, 10:43 AM

## 2020-03-13 ENCOUNTER — Inpatient Hospital Stay (HOSPITAL_COMMUNITY)
Admission: AD | Admit: 2020-03-13 | Discharge: 2020-03-18 | DRG: 885 | Disposition: A | Discharge status: Home / Self Care | Payer: Federal, State, Local not specified - Other | Source: Intra-hospital | Attending: Psychiatry | Admitting: Psychiatry

## 2020-03-13 ENCOUNTER — Encounter (HOSPITAL_COMMUNITY): Payer: Self-pay | Admitting: Psychiatry

## 2020-03-13 ENCOUNTER — Other Ambulatory Visit: Payer: Self-pay

## 2020-03-13 DIAGNOSIS — F419 Anxiety disorder, unspecified: Secondary | ICD-10-CM | POA: Diagnosis present

## 2020-03-13 DIAGNOSIS — Z888 Allergy status to other drugs, medicaments and biological substances status: Secondary | ICD-10-CM

## 2020-03-13 DIAGNOSIS — Z560 Unemployment, unspecified: Secondary | ICD-10-CM | POA: Diagnosis not present

## 2020-03-13 DIAGNOSIS — R270 Ataxia, unspecified: Secondary | ICD-10-CM | POA: Diagnosis present

## 2020-03-13 DIAGNOSIS — F3175 Bipolar disorder, in partial remission, most recent episode depressed: Secondary | ICD-10-CM | POA: Diagnosis not present

## 2020-03-13 DIAGNOSIS — Z880 Allergy status to penicillin: Secondary | ICD-10-CM

## 2020-03-13 DIAGNOSIS — F17210 Nicotine dependence, cigarettes, uncomplicated: Secondary | ICD-10-CM | POA: Diagnosis present

## 2020-03-13 DIAGNOSIS — R45851 Suicidal ideations: Secondary | ICD-10-CM | POA: Diagnosis present

## 2020-03-13 DIAGNOSIS — Z87442 Personal history of urinary calculi: Secondary | ICD-10-CM

## 2020-03-13 DIAGNOSIS — Z79899 Other long term (current) drug therapy: Secondary | ICD-10-CM

## 2020-03-13 DIAGNOSIS — F319 Bipolar disorder, unspecified: Principal | ICD-10-CM | POA: Diagnosis present

## 2020-03-13 DIAGNOSIS — F1290 Cannabis use, unspecified, uncomplicated: Secondary | ICD-10-CM | POA: Diagnosis present

## 2020-03-13 DIAGNOSIS — Z9119 Patient's noncompliance with other medical treatment and regimen: Secondary | ICD-10-CM | POA: Diagnosis not present

## 2020-03-13 DIAGNOSIS — Z915 Personal history of self-harm: Secondary | ICD-10-CM | POA: Diagnosis not present

## 2020-03-13 DIAGNOSIS — R4587 Impulsiveness: Secondary | ICD-10-CM | POA: Diagnosis present

## 2020-03-13 DIAGNOSIS — Z882 Allergy status to sulfonamides status: Secondary | ICD-10-CM

## 2020-03-13 DIAGNOSIS — Z79890 Hormone replacement therapy (postmenopausal): Secondary | ICD-10-CM | POA: Diagnosis not present

## 2020-03-13 DIAGNOSIS — Z794 Long term (current) use of insulin: Secondary | ICD-10-CM

## 2020-03-13 DIAGNOSIS — E10649 Type 1 diabetes mellitus with hypoglycemia without coma: Secondary | ICD-10-CM | POA: Diagnosis present

## 2020-03-13 DIAGNOSIS — F332 Major depressive disorder, recurrent severe without psychotic features: Secondary | ICD-10-CM | POA: Diagnosis present

## 2020-03-13 DIAGNOSIS — G4700 Insomnia, unspecified: Secondary | ICD-10-CM | POA: Diagnosis present

## 2020-03-13 DIAGNOSIS — Z818 Family history of other mental and behavioral disorders: Secondary | ICD-10-CM | POA: Diagnosis not present

## 2020-03-13 DIAGNOSIS — Z91018 Allergy to other foods: Secondary | ICD-10-CM

## 2020-03-13 HISTORY — DX: Anxiety disorder, unspecified: F41.9

## 2020-03-13 LAB — GLUCOSE, CAPILLARY
Glucose-Capillary: 187 mg/dL — ABNORMAL HIGH (ref 70–99)
Glucose-Capillary: 153 mg/dL — ABNORMAL HIGH (ref 70–99)
Glucose-Capillary: 157 mg/dL — ABNORMAL HIGH (ref 70–99)
Glucose-Capillary: 225 mg/dL — ABNORMAL HIGH (ref 70–99)

## 2020-03-13 MED ORDER — INSULIN ASPART 100 UNIT/ML ~~LOC~~ SOLN
0.0000 [IU] | Freq: Three times a day (TID) | SUBCUTANEOUS | Status: DC
  Administered 2020-03-13: 2 [IU] via SUBCUTANEOUS
  Administered 2020-03-14 (×2): 3 [IU] via SUBCUTANEOUS
  Administered 2020-03-14: 9 [IU] via SUBCUTANEOUS
  Administered 2020-03-15: 3 [IU] via SUBCUTANEOUS
  Administered 2020-03-15: 5 [IU] via SUBCUTANEOUS
  Administered 2020-03-15: 7 [IU] via SUBCUTANEOUS
  Administered 2020-03-16: 3 [IU] via SUBCUTANEOUS
  Administered 2020-03-16 – 2020-03-17 (×2): 9 [IU] via SUBCUTANEOUS
  Administered 2020-03-17: 5 [IU] via SUBCUTANEOUS
  Administered 2020-03-17 – 2020-03-18 (×2): 2 [IU] via SUBCUTANEOUS

## 2020-03-13 MED ORDER — PREGABALIN 75 MG PO CAPS: 150.0000 mg | ORAL_CAPSULE | Freq: Two times a day (BID) | ORAL | Status: DC

## 2020-03-13 MED ORDER — INSULIN ASPART 100 UNIT/ML ~~LOC~~ SOLN: 0.0000 [IU] | Freq: Three times a day (TID) | SUBCUTANEOUS | 11 refills | Status: DC

## 2020-03-13 MED ORDER — LEVOTHYROXINE SODIUM 100 MCG PO TABS
100.0000 ug | ORAL_TABLET | Freq: Every day | ORAL | Status: DC
  Administered 2020-03-14 – 2020-03-18 (×5): 100 ug via ORAL
  Filled 2020-03-13: qty 7
  Filled 2020-03-13 (×3): qty 1
  Filled 2020-03-13: qty 7
  Filled 2020-03-13 (×4): qty 1

## 2020-03-13 MED ORDER — MELATONIN 3 MG PO TABS: 6.0000 mg | ORAL_TABLET | Freq: Every evening | ORAL | 0 refills | Status: DC | PRN

## 2020-03-13 MED ORDER — INSULIN ASPART 100 UNIT/ML ~~LOC~~ SOLN: 0.0000 [IU] | Freq: Every day | SUBCUTANEOUS | Status: DC

## 2020-03-13 MED ORDER — ATORVASTATIN CALCIUM 20 MG PO TABS
20.0000 mg | ORAL_TABLET | Freq: Every day | ORAL | Status: DC
  Administered 2020-03-14 – 2020-03-18 (×5): 20 mg via ORAL
  Filled 2020-03-13 (×6): qty 1
  Filled 2020-03-13 (×2): qty 7

## 2020-03-13 MED ORDER — INSULIN DETEMIR 100 UNIT/ML ~~LOC~~ SOLN: 35.0000 [IU] | Freq: Every day | SUBCUTANEOUS | Status: DC

## 2020-03-13 MED ORDER — INSULIN ASPART 100 UNIT/ML ~~LOC~~ SOLN: 10.0000 [IU] | Freq: Three times a day (TID) | SUBCUTANEOUS | 0 refills | Status: DC

## 2020-03-13 MED ORDER — ATORVASTATIN CALCIUM 20 MG PO TABS: 20.0000 mg | ORAL_TABLET | Freq: Every day | ORAL | Status: DC

## 2020-03-13 MED ORDER — PREGABALIN 75 MG PO CAPS
150.0000 mg | ORAL_CAPSULE | Freq: Two times a day (BID) | ORAL | Status: DC
  Administered 2020-03-13 – 2020-03-18 (×10): 150 mg via ORAL
  Filled 2020-03-13 (×10): qty 2

## 2020-03-13 MED ORDER — ACETAMINOPHEN 325 MG PO TABS
650.0000 mg | ORAL_TABLET | Freq: Four times a day (QID) | ORAL | Status: DC | PRN
  Administered 2020-03-14 – 2020-03-17 (×2): 650 mg via ORAL
  Filled 2020-03-13 (×2): qty 2

## 2020-03-13 MED ORDER — ESCITALOPRAM OXALATE 10 MG PO TABS
10.0000 mg | ORAL_TABLET | Freq: Every day | ORAL | Status: DC
  Administered 2020-03-14 – 2020-03-16 (×3): 10 mg via ORAL
  Filled 2020-03-13 (×4): qty 1

## 2020-03-13 MED ORDER — DIVALPROEX SODIUM 500 MG PO DR TAB: 500.0000 mg | DELAYED_RELEASE_TABLET | Freq: Two times a day (BID) | ORAL | Status: DC

## 2020-03-13 MED ORDER — TRAZODONE HCL 50 MG PO TABS
50.0000 mg | ORAL_TABLET | Freq: Every day | ORAL | Status: DC
  Administered 2020-03-13: 50 mg via ORAL
  Filled 2020-03-13 (×3): qty 1

## 2020-03-13 MED ORDER — TRAZODONE HCL 50 MG PO TABS: 50.0000 mg | ORAL_TABLET | Freq: Every evening | ORAL | Status: DC | PRN

## 2020-03-13 MED ORDER — QUETIAPINE FUMARATE 100 MG PO TABS: 100.0000 mg | ORAL_TABLET | Freq: Every day | ORAL | Status: DC

## 2020-03-13 MED ORDER — TRAZODONE HCL 50 MG PO TABS
50.0000 mg | ORAL_TABLET | Freq: Every evening | ORAL | Status: DC | PRN
  Administered 2020-03-14: 50 mg via ORAL
  Filled 2020-03-13: qty 1

## 2020-03-13 MED ORDER — INSULIN ASPART 100 UNIT/ML ~~LOC~~ SOLN
10.0000 [IU] | Freq: Three times a day (TID) | SUBCUTANEOUS | Status: DC
  Administered 2020-03-13 – 2020-03-18 (×14): 10 [IU] via SUBCUTANEOUS

## 2020-03-13 MED ORDER — INSULIN ASPART 100 UNIT/ML ~~LOC~~ SOLN: 0.0000 [IU] | Freq: Three times a day (TID) | SUBCUTANEOUS | Status: DC

## 2020-03-13 MED ORDER — POLYETHYLENE GLYCOL 3350 17 G PO PACK
17.0000 g | PACK | Freq: Every day | ORAL | Status: DC
  Filled 2020-03-13 (×7): qty 1

## 2020-03-13 MED ORDER — INSULIN DETEMIR 100 UNIT/ML ~~LOC~~ SOLN: 35.0000 [IU] | Freq: Every day | SUBCUTANEOUS | 0 refills | Status: DC

## 2020-03-13 MED ORDER — MELATONIN 3 MG PO TABS
6.0000 mg | ORAL_TABLET | Freq: Every evening | ORAL | Status: DC | PRN
  Administered 2020-03-13 – 2020-03-17 (×5): 6 mg via ORAL
  Filled 2020-03-13 (×5): qty 2

## 2020-03-13 MED ORDER — INSULIN ASPART 100 UNIT/ML ~~LOC~~ SOLN
0.0000 [IU] | Freq: Three times a day (TID) | SUBCUTANEOUS | Status: DC
Start: 2020-03-14 — End: 2020-03-13

## 2020-03-13 MED ORDER — QUETIAPINE FUMARATE 100 MG PO TABS
100.0000 mg | ORAL_TABLET | Freq: Every day | ORAL | Status: DC
  Administered 2020-03-13: 100 mg via ORAL
  Filled 2020-03-13 (×4): qty 1

## 2020-03-13 MED ORDER — DIVALPROEX SODIUM 500 MG PO DR TAB
500.0000 mg | DELAYED_RELEASE_TABLET | Freq: Two times a day (BID) | ORAL | Status: DC
  Administered 2020-03-13 – 2020-03-14 (×2): 500 mg via ORAL
  Filled 2020-03-13 (×7): qty 1

## 2020-03-13 MED ORDER — ESCITALOPRAM OXALATE 10 MG PO TABS: 10.0000 mg | ORAL_TABLET | Freq: Every day | ORAL | Status: DC

## 2020-03-13 MED ORDER — LEVOTHYROXINE SODIUM 50 MCG PO TABS: 50.0000 ug | ORAL_TABLET | Freq: Every day | ORAL | Status: DC

## 2020-03-13 MED ORDER — INSULIN ASPART 100 UNIT/ML ~~LOC~~ SOLN
0.0000 [IU] | Freq: Every day | SUBCUTANEOUS | Status: DC
  Administered 2020-03-13 – 2020-03-14 (×2): 2 [IU] via SUBCUTANEOUS

## 2020-03-13 NOTE — Progress Notes (Signed)
Report given to [facility] at [facility] adult unit. Pt will be going to room 303 bed 1.

## 2020-03-13 NOTE — [facility] Suicide Risk Assessment (Signed)
[facility] Admission Suicide Risk Assessment   Nursing information obtained from:   Patient /chart Demographic factors:   34 year old male, lives with roommate, currently unemployed Current Mental Status:   To below Loss Factors:   Mother passed away 3 years ago, pet dog passed away 2 years ago, limited support network, recent [place] of state, ran out of prescribed psychiatric medications Historical Factors:   Reports past history of bipolar disorder diagnosis, history of prior suicide attempts, history of prior psychiatric admissions Risk Reduction Factors:   Resilience  Total Time spent with patient: 45 minutes Principal Problem:  Bipolar Disorder, Depressed Diagnosis: Bipolar Disorder, Depressed .  Subjective Data:   Continued Clinical Symptoms:    The "Alcohol Use Disorders Identification Test", Guidelines for Use in Primary Care, Second Edition.  World Science writer [facility]). Score between 0-7:  no or low risk or alcohol related problems. Score between 8-15:  moderate risk of alcohol related problems. Score between 16-19:  high risk of alcohol related problems. Score 20 or above:  warrants further diagnostic evaluation for alcohol dependence and treatment.   CLINICAL FACTORS:  34 year old male, presented to the emergency room on 5/13 following a suicide attempt by overdosing on insulin (patient is diabetic, on insulin management).  He describes overdose as impulsive, unplanned and in the context of feeling depressed.  He was initially admitted to medical unit for stabilization.  He reports history of bipolar disorder diagnoses and describes episodes of depression and also brief episodes of increased energy and decreased need for sleep. He reports he has felt depressed over recent months and identifies protruding stressors as death of his mother 2 years ago, death of a pet dog 2 years ago, recent relocation from [place] several months ago after which he ran out of his  prescribed psychiatric medications several weeks ago.    Psychiatric Specialty Exam: Physical Exam  Review of Systems  There were no vitals taken for this visit.There is no height or weight on file to calculate BMI.  See admit note MSE   COGNITIVE FEATURES THAT CONTRIBUTE TO RISK:  Closed-mindedness and Loss of executive function    SUICIDE RISK:   Moderate:  Frequent suicidal ideation with limited intensity, and duration, some specificity in terms of plans, no associated intent, good self-control, limited dysphoria/symptomatology, some risk factors present, and identifiable protective factors, including available and accessible social support.  PLAN OF CARE: Patient will be admitted to inpatient psychiatric unit for stabilization and safety. Will provide and encourage milieu participation. Provide medication management and maked adjustments as needed.  Will follow daily.    I certify that inpatient services furnished can reasonably be expected to improve the patient's condition.   Craige Cotta, MD 03/13/2020, 5:39 PM

## 2020-03-13 NOTE — Progress Notes (Signed)
Called and spoke with Consuella Lose to set up transport with [employer] for patient to go to [facility].

## 2020-03-13 NOTE — Progress Notes (Signed)
   03/13/20 2311  COVID-19 Daily Checkoff  Have you had a fever (temp > 37.80C/100F)  in the past 24 hours?  No  If you have had runny nose, nasal congestion, sneezing in the past 24 hours, has it worsened? No  COVID-19 EXPOSURE  Have you traveled outside the state in the past 14 days? No  Have you been in contact with someone with a confirmed diagnosis of COVID-19 or PUI in the past 14 days without wearing appropriate PPE? No  Have you been living in the same home as a person with confirmed diagnosis of COVID-19 or a PUI (household contact)? No  Have you been diagnosed with COVID-19? No

## 2020-03-13 NOTE — H&P (Signed)
Psychiatric Admission Assessment Adult  Patient Identification: Mark [name] MRN:  [id number] Date of Evaluation:  03/13/2020 Chief Complaint:  " I just wanted to die" Principal Diagnosis:  S/P suicide attempt by insulin overdose . Bipolar Disorder, Depressed , by history  Diagnosis:S/P suicide attempt by insulin overdose . Bipolar Disorder, Depressed , by history    History of Present Illness: 34 y old male, presented to ED on 5/15 following overdose on insulin. (Reports he took about 80 units of insulin ). In ED was hypoglycemic , requiring correction with D50. He was admitted to medical unit for stabilization.  He was States that his roommate witnessed overdose and called 911. He describes overdose as impulsive / unplanned. States he has been feeling depressed " for a long time", particularly after his mother passed away 3 years ago. He identifies stressors including limited support network , mother's death, pet dog's death 2 years ago as contributing factors . He also reports that he had moved from [place] about 6 months ago, and had continued taking his  medications which had been prescribed there until they ran out about a month ago ( Paxil, Seroquel, Lyrica,Depakote). He states he did continue to take insulin as prescribed .  Endorses neuro-vegetative symptoms of depression as below. Denies psychotic symptoms .  He was seen by psychiatry consultant during his medical admission and inpatient psychiatric admission upon medical clearance was recommended. Associated Signs/Symptoms: Depression Symptoms:  depressed mood, anhedonia, insomnia, suicidal attempt, loss of energy/fatigue, (Hypo) Manic Symptoms: vague irritability Anxiety Symptoms:  Reports some increased anxiety recently Psychotic Symptoms:denies  PTSD Symptoms: Denies  Total Time spent with patient: 45 minutes  Past Psychiatric History: history of past psychiatric admissions, most recently 6 months ago, for depression,  suicidal attempt by overdose . He reports history of several suicide attempts starting in 2008. Denies history of self cutting. Reports he has been diagnosed with Bipolar Disorder and describes brief episodes of increased energy and decreased need for sleep.  Denies history of psychosis.   Is the patient at risk to self? Yes.    Has the patient been a risk to self in the past 6 months? Yes.    Has the patient been a risk to self within the distant past? Yes.    Is the patient a risk to others? No.  Has the patient been a risk to others in the past 6 months? No.  Has the patient been a risk to others within the distant past? No.   Prior Inpatient Therapy:  as above  Prior Outpatient Therapy:  does not have an outpatient psychiatrist or pre scriber, states he had been taking medications from  old/prior prescriptions   Alcohol Screening:   Substance Abuse History in the last 12 months:  Denies alcohol abuse. Uses cannabis 2 x week on average, denies other drug abuse  Consequences of Substance Abuse: Denies  Previous Psychotropic Medications: Depakote 500 mgrs BID ( x 2 years ) , Lexapro 10 mgrs QDAY  , Seroquel which he reports he was taking at 300 mgrs QHS prior to admission (  X 2 years ) , Lyrica 150 mgrs BID ( x 3 years ) . He states that prior to admission he had been on Paxil which he states he felt worked well .  Psychological Evaluations:No  Past Medical History: History of DM I , on Insulin. History of peripheral neuropathy.  Hypothyroidism. History of hydrocephalus ( shunt procedure last year), reports past history of "seizures" , last   one 5 years ago. Describes as episodes of muscle contraction/tension, without loss of consciousness  Past Medical History:  Diagnosis Date  . Bipolar disorder ([place])   . Depression   . Diabetes mellitus without complication ([place])   . 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 Hydrocephalus ([place])   . Kidney stones   .  Seizures ([place])     Past Surgical History:  Procedure Laterality Date  . KIDNEY STONE SURGERY     Family History: mother deceased, died of breast cancer 3 years ago, father lives in [place] Family History  Problem Relation Age of Onset  . Breast cancer Mother    Family Psychiatric  History: aunt has bipolar disorder, sister has history of depression. A maternal great uncle committed suicide Tobacco Screening:  smokes 1 PPD  Social History: 89, lives with co-home owner ( friend), no children,  unemployed  Social History   Substance and Sexual Activity  Alcohol Use Never   Comment: occassional      Social History   Substance and Sexual Activity  Drug Use Yes  . Types: Marijuana   Comment: smoked 2-3 days ago    Additional Social History:  Allergies:   Allergies  Allergen Reactions  . Mushroom Extract Complex Hives, Itching and Nausea And Vomiting  . Penicillins Anaphylaxis, Hives and Swelling    Has patient had a PCN reaction causing immediate rash, facial/tongue/throat swelling, SOB or lightheadedness with hypotension: Yes Has patient had a PCN reaction causing severe rash involving mucus membranes or skin necrosis: No Has patient had a PCN reaction that required hospitalization: Yes Has patient had a PCN reaction occurring within the last 10 years: Yes If all of the above answers are "NO", then may proceed with Cephalosporin use.  . Sulfa Antibiotics Anaphylaxis and Hives  . Clindamycin/Lincomycin Hives   Lab Results:  Results for orders placed or performed during the hospital encounter of 03/05/20 (from the past 48 hour(s))  Glucose, capillary     Status: Abnormal   Collection Time: 03/11/20 11:05 PM  Result Value Ref Range   Glucose-Capillary 250 (H) 70 - 99 mg/dL    Comment: Glucose reference range applies only to samples taken after fasting for at least 8 hours.  Glucose, capillary     Status: Abnormal   Collection Time: 03/12/20  7:30 AM  Result Value Ref Range    Glucose-Capillary 453 (H) 70 - 99 mg/dL    Comment: Glucose reference range applies only to samples taken after fasting for at least 8 hours.  Glucose, capillary     Status: Abnormal   Collection Time: 03/12/20  9:35 AM  Result Value Ref Range   Glucose-Capillary 405 (H) 70 - 99 mg/dL    Comment: Glucose reference range applies only to samples taken after fasting for at least 8 hours.  Glucose, capillary     Status: Abnormal   Collection Time: 03/12/20 12:20 PM  Result Value Ref Range   Glucose-Capillary 258 (H) 70 - 99 mg/dL    Comment: Glucose reference range applies only to samples taken after fasting for at least 8 hours.  Glucose, capillary     Status: Abnormal   Collection Time: 03/12/20  4:26 PM  Result Value Ref Range   Glucose-Capillary 167 (H) 70 - 99 mg/dL    Comment: Glucose reference range applies only to samples taken after fasting for at least 8 hours.  Glucose, capillary     Status: Abnormal   Collection  Time: 03/12/20  8:02 PM  Result Value Ref Range   Glucose-Capillary 171 (H) 70 - 99 mg/dL    Comment: Glucose reference range applies only to samples taken after fasting for at least 8 hours.  Glucose, capillary     Status: Abnormal   Collection Time: 03/13/20  7:37 AM  Result Value Ref Range   Glucose-Capillary 187 (H) 70 - 99 mg/dL    Comment: Glucose reference range applies only to samples taken after fasting for at least 8 hours.  Glucose, capillary     Status: Abnormal   Collection Time: 03/13/20 11:24 AM  Result Value Ref Range   Glucose-Capillary 153 (H) 70 - 99 mg/dL    Comment: Glucose reference range applies only to samples taken after fasting for at least 8 hours.    Blood Alcohol level:  Lab Results  Component Value Date   ETH <10 03/05/2020   ETH <10 70/26/3785    Metabolic Disorder Labs:  Lab Results  Component Value Date   HGBA1C 8.8 (H) 02/03/2020   MPG 206 02/03/2020   MPG 194.38 12/29/2019   No results found for: PROLACTIN Lab  Results  Component Value Date   CHOL 214 (H) 03/05/2020   TRIG 81 03/05/2020   HDL 45 03/05/2020   CHOLHDL 4.8 03/05/2020   VLDL 16 03/05/2020   LDLCALC 153 (H) 03/05/2020   LDLCALC 141 (H) 09/11/2017    Current Medications: No current facility-administered medications for this encounter.   PTA Medications: Medications Prior to Admission  Medication Sig Dispense Refill Last Dose  . [START ON 03/14/2020] atorvastatin (LIPITOR) 20 MG tablet Take 1 tablet (20 mg total) by mouth daily.     . blood glucose meter kit and supplies KIT Dispense based on patient and insurance preference. Use up to four times daily as directed. (FOR ICD-9 250.00, 250.01). 1 each 0   . divalproex (DEPAKOTE) 500 MG DR tablet Take 1 tablet (500 mg total) by mouth every 12 (twelve) hours. 60 tablet 0   . escitalopram (LEXAPRO) 10 MG tablet Take 1 tablet (10 mg total) by mouth daily. 30 tablet 0   . insulin aspart (NOVOLOG) 100 UNIT/ML injection Inject 0-9 Units into the skin 4 (four) times daily -  before meals and at bedtime. 10 mL 11   . insulin aspart (NOVOLOG) 100 UNIT/ML injection Inject 10 Units into the skin 3 (three) times daily with meals. 10 mL 0   . [START ON 03/14/2020] insulin detemir (LEVEMIR) 100 UNIT/ML injection Inject 0.35 mLs (35 Units total) into the skin daily. 10 mL 0   . Insulin Pen Needle (PEN NEEDLES 3/16") 31G X 5 MM MISC 1 Dose by Does not apply route 3 (three) times daily. 100 each 0   . levothyroxine (SYNTHROID) 100 MCG tablet Take 1 tablet (100 mcg total) by mouth daily before breakfast. 30 tablet 0   . melatonin 3 MG TABS tablet Take 2 tablets (6 mg total) by mouth at bedtime as needed (sleep).  0   . pregabalin (LYRICA) 150 MG capsule Take 1 capsule (150 mg total) by mouth 2 (two) times daily. 60 capsule 0   . QUEtiapine (SEROQUEL) 100 MG tablet Take 1 tablet (100 mg total) by mouth at bedtime. 30 tablet 0   . traZODone (DESYREL) 50 MG tablet Take 1 tablet (50 mg total) by mouth at  bedtime. 30 tablet 0     Musculoskeletal: Strength & Muscle Tone: within normal limits Gait & Station: normal Patient leans:  N/A  Psychiatric Specialty Exam: Physical Exam  Review of Systems  Constitutional: Negative.   HENT: Negative.   Eyes: Negative.   Respiratory: Negative for cough and shortness of breath.   Cardiovascular: Negative.  Negative for chest pain.  Gastrointestinal: Negative for diarrhea, nausea and vomiting.  Endocrine: Negative.   Genitourinary: Negative.   Musculoskeletal:       Reports discomfort associated with peripheral neuropathy  Skin: Negative for rash.  Allergic/Immunologic: Negative.   Neurological:       Reports past history of seizures   Hematological: Negative.   Psychiatric/Behavioral: Negative.        Depression     There were no vitals taken for this visit.There is no height or weight on file to calculate BMI.  General Appearance: Fairly Groomed  Eye Contact:  Fair  Speech:  Normal Rate  Volume:  Decreased  Mood:  Depressed  Affect:  constricted   Thought Process:  Linear and Descriptions of Associations: Intact  Orientation:  Other:  fully alert and attentive  Thought Content:  no hallucinations, no delusions, not internally preoccupied   Suicidal Thoughts:  No he currently denies suicidal ideations and contracts for safety on unit, denies homicidal or violent ideations  Homicidal Thoughts:  No  Memory:  recent and remote grossly intact   Judgement:  Fair  Insight:  Fair  Psychomotor Activity:  Decreased  Concentration:  Concentration: Good and Attention Span: Good  Recall:  Good  Fund of Knowledge:  Good  Language:  Good  Akathisia:  Negative  Handed:  Right  AIMS (if indicated):     Assets:  Communication Skills Desire for Improvement Resilience  ADL's: fair   Cognition:  WNL  Sleep:       Treatment Plan Summary: Daily contact with patient to assess and evaluate symptoms and progress in treatment, Medication  management, Plan inpatient treatment and medications as below   Observation Level/Precautions:  15 minute checks  Laboratory:  as needed  CBGs today 153 and 157.  Check Valproic Acid Serum Level   Psychotherapy:  Milieu, group therapy  Medications:  He is continued on medications listed in hospitalist discharge summary. I have reviewed admission orders, with special attention to insulin tx/diabetic management, with Dr. KC Ramesh as well .  Depakote 500 mgrs BID Lexapro 10 mgrs QDAY  Seroquel 100 mgrs QHS Lyrica 150 mgrs BID Synthroid 100 micrograms QDAY  Lipitor 20 mgrs QDAY  Levemir Insulin 35 units QDAY starting 5/24. Novolog 10 units TID with meals .  Novolog sliding scale TID with meals and at bedtime  We reviewed antidepressant management. Patient reports has been on Lexapro and on Paxil in the past . Had been off medications, including antidepressant, for several weeks prior to admission. Has been started back on Lexapro while on medical unit, tolerating well thus far.   Consultations:  DM coordinator for assistance with diabetic management  Discharge Concerns:  -   Estimated LOS: 5 days   Other:     Physician Treatment Plan for Primary Diagnosis: Bipolar Disorder, Depressed  Long Term Goal(s): Improvement in symptoms so as ready for discharge  Short Term Goals: Ability to identify changes in lifestyle to reduce recurrence of condition will improve, Ability to verbalize feelings will improve, Ability to disclose and discuss suicidal ideas, Ability to demonstrate self-control will improve, Ability to identify and develop effective coping behaviors will improve and Ability to maintain clinical measurements within normal limits will improve  Physician Treatment Plan for Secondary   Diagnosis: Bipolar Disorder, Depressed  Long Term Goal(s): Improvement in symptoms so as ready for discharge  Short Term Goals: Ability to identify changes in lifestyle to reduce recurrence of condition  will improve, Ability to verbalize feelings will improve, Ability to disclose and discuss suicidal ideas, Ability to demonstrate self-control will improve, Ability to identify and develop effective coping behaviors will improve, Ability to maintain clinical measurements within normal limits will improve and Compliance with prescribed medications will improve  I certify that inpatient services furnished can reasonably be expected to improve the patient's condition.     A , MD 5/23/20214:31 PM 

## 2020-03-13 NOTE — Tx Team (Signed)
Initial Treatment Plan 03/13/2020 6:48 PM [name] SYS:[id number]    PATIENT STRESSORS: Health problems Substance abuse   PATIENT STRENGTHS: Communication skills Supportive family/friends Work skills   PATIENT IDENTIFIED PROBLEMS: Suicidal ideations  anxiety  depression  Substance use/abuse               DISCHARGE CRITERIA:  Ability to meet basic life and health needs Adequate post-discharge living arrangements Improved stabilization in mood, thinking, and/or behavior Motivation to continue treatment in a less acute level of care  PRELIMINARY DISCHARGE PLAN: Outpatient therapy Return to previous living arrangement Return to previous work or school arrangements  PATIENT/FAMILY INVOLVEMENT: This treatment plan has been presented to and reviewed with the patient, Mark [name].  The patient and family have been given the opportunity to ask questions and make suggestions.  Raylene Miyamoto, RN 03/13/2020, 6:48 PM

## 2020-03-13 NOTE — Progress Notes (Signed)
All patients personal belongings including hat, clothes, shoes and cell phone sent with patient via [employer] to go to [facility]. Patient had no further questions or concerns.

## 2020-03-13 NOTE — Progress Notes (Signed)
   03/13/20 2315  Psych Admission Type (Psych Patients Only)  Admission Status Involuntary  Psychosocial Assessment  Patient Complaints Depression  Eye Contact Fair  Facial Expression Flat  Affect Appropriate to circumstance  Speech Logical/coherent  Interaction Assertive  Motor Activity Other (Comment) (WDL)  Appearance/Hygiene Disheveled;Body odor  Behavior Characteristics Appropriate to situation  Mood Anxious;Pleasant  Thought Process  Coherency WDL  Content WDL  Delusions None reported or observed  Perception WDL  Hallucination None reported or observed  Judgment Poor  Confusion None  Danger to Self  Current suicidal ideation? Denies  Self-Injurious Behavior No self-injurious ideation or behavior indicators observed or expressed   Agreement Not to Harm Self Yes  Description of Agreement contracts for safety verbal  Danger to Others  Danger to Others None reported or observed
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
## 2020-03-13 NOTE — Progress Notes (Signed)
[facility] Group Notes:  (Nursing/MHT/Case Management/Adjunct)  Date:  03/13/2020  Time:  2030  Type of Therapy:  wrap up group  Participation Level:  Active  Participation Quality:  Appropriate, Attentive, Sharing and Supportive  Affect:  Depressed and Irritable  Cognitive:  Alert  Insight:  Improving  Engagement in Group:  Engaged  Modes of Intervention:  Clarification, Education and Support  Summary of Progress/Problems: Positive thinking and positive change were discussed.   Marcille Buffy 03/13/2020, 9:48 PM

## 2020-03-14 DIAGNOSIS — F332 Major depressive disorder, recurrent severe without psychotic features: Secondary | ICD-10-CM

## 2020-03-14 LAB — GLUCOSE, CAPILLARY
Glucose-Capillary: 406 mg/dL — ABNORMAL HIGH (ref 70–99)
Glucose-Capillary: 220 mg/dL — ABNORMAL HIGH (ref 70–99)
Glucose-Capillary: 249 mg/dL — ABNORMAL HIGH (ref 70–99)
Glucose-Capillary: 256 mg/dL — ABNORMAL HIGH (ref 70–99)

## 2020-03-14 MED ORDER — NICOTINE POLACRILEX 2 MG MT GUM
2.0000 mg | CHEWING_GUM | OROMUCOSAL | Status: DC | PRN
  Administered 2020-03-14 – 2020-03-18 (×6): 2 mg via ORAL
  Filled 2020-03-14 (×3): qty 1

## 2020-03-14 MED ORDER — DIVALPROEX SODIUM 500 MG PO DR TAB
500.0000 mg | DELAYED_RELEASE_TABLET | Freq: Every day | ORAL | Status: DC
  Administered 2020-03-15 – 2020-03-18 (×4): 500 mg via ORAL
  Filled 2020-03-14 (×4): qty 1
  Filled 2020-03-14 (×2): qty 7
  Filled 2020-03-14: qty 1

## 2020-03-14 MED ORDER — QUETIAPINE FUMARATE 200 MG PO TABS
200.0000 mg | ORAL_TABLET | Freq: Every day | ORAL | Status: DC
  Administered 2020-03-14: 200 mg via ORAL
  Filled 2020-03-14 (×3): qty 1

## 2020-03-14 MED ORDER — DIVALPROEX SODIUM 250 MG PO DR TAB
750.0000 mg | DELAYED_RELEASE_TABLET | Freq: Every evening | ORAL | Status: DC
  Administered 2020-03-14 – 2020-03-17 (×4): 750 mg via ORAL
  Filled 2020-03-14: qty 21
  Filled 2020-03-14 (×4): qty 3
  Filled 2020-03-14: qty 21
  Filled 2020-03-14 (×2): qty 3

## 2020-03-14 MED ORDER — INSULIN DETEMIR 100 UNIT/ML ~~LOC~~ SOLN
35.0000 [IU] | Freq: Every day | SUBCUTANEOUS | Status: DC
  Administered 2020-03-14: 35 [IU] via SUBCUTANEOUS

## 2020-03-14 NOTE — Progress Notes (Signed)
Spiritual care group on grief and loss facilitated by chaplain Burnis Kingfisher  Group Goal:  Support / Education around grief and loss  Members engage in facilitated group support and psycho-social education.  Group Description:  Following introductions and group rules, group members engaged in facilitated group support around topic of loss, with particular support around experiences of loss in their lives. Group Identified types of loss (relationships / self / things) and identified patterns, circumstances, and changes that precipitate losses. Reflected on thoughts / feelings around loss, normalized grief responses, and recognized variety in grief experience.  Group reflected on Worden's Tasks of Grief Group drew on Adlerian and narrative frameworks, as well as Worden's Tasks model  PT PROGRESS.   Was present at group introductions.  Left group after introductions and did not return to group room.

## 2020-03-14 NOTE — Progress Notes (Signed)
D:  Patient denied SI and HI, contracts for safety.  Denied A/V hallucinations.  Denied pain. A: Patient refused levemir 35 unit this morning.  Discussed meds with MD/NP.  Emotional support and encouragement given patient. R:  Safety maintained with 15 minute checks.  Patient has been irritable today.  Has stayed in his room most of the day.   Patient stated he will commit suicide after discharge from [facility] but would not tell his plan.  Contracts for safety at [facility].

## 2020-03-14 NOTE — Progress Notes (Signed)
[facility] Group Notes:  (Nursing/MHT/Case Management/Adjunct)  Date:  03/14/2020  Time:  2030 Type of Therapy:  wrap up group  Participation Level:  Active  Participation Quality:  Appropriate, Attentive, Sharing and Supportive  Affect:  Appropriate  Cognitive:  Alert  Insight:  Improving  Engagement in Group:  Engaged  Modes of Intervention:  Clarification, Education and Support  Summary of Progress/Problems: Positive thinking and self-care were discussed.   Marcille Buffy 03/14/2020, 9:47 PM

## 2020-03-14 NOTE — Progress Notes (Signed)
[facility] MD Progress Note  03/14/2020 1:42 PM Mark [name]  MRN:  [id number]   Subjective: Follow-up for this 34 year old male diagnosed with MDD severe recurrent.  Patient reports today that he does not feel like he is doing very well today.  He feels that he is not getting the treatment he needs.  He continues to state that his Seroquel needs to be increased because he used to take 300 mg a day and his Lyrica needs to be increased but gives no reason for it.  He reports that he has been on Depakote 500 every 12 hours for the last couple years and that has been on the Lexapro for 2 to 3 months.  He states that he knows he needs his medications increased.  He states that he does have some issues with getting agitated easily.  Patient states that when he gets agitated things are going his way he does have thoughts of self-harm but states that they go away shortly after when he goes and relaxes.  He denies any current homicidal ideations and denies any hallucinations.  Patient reports that he has been sleeping okay and his appetite is been good.  Principal Problem: MDD (major depressive disorder), recurrent episode, severe (HCC) Diagnosis: Principal Problem:   MDD (major depressive disorder), recurrent episode, severe (HCC)  Total Time spent with patient: 30 minutes  Past Psychiatric History: history of past psychiatric admissions, most recently 6 months ago, for depression, suicidal attempt by overdose . He reports history of several suicide attempts starting in 2008. Denies history of self cutting. Reports he has been diagnosed with Bipolar Disorder and describes brief episodes of increased energy and decreased need for sleep.  Denies history of psychosis.  Past Medical History:  Past Medical History:  Diagnosis Date  . Anxiety   . Bipolar disorder (HCC)   . Depression   . Diabetes mellitus without complication (HCC)   . Diabetic polyneuropathy associated with type 1 diabetes mellitus (HCC)   .  Hydrocephalus (HCC)   . Kidney stones   . Seizures (HCC)     Past Surgical History:  Procedure Laterality Date  . KIDNEY STONE SURGERY     Family History:  Family History  Problem Relation Age of Onset  . Breast cancer Mother    Family Psychiatric  History: aunt has bipolar disorder, sister has history of depression. A maternal great uncle committed suicide Social History:  Social History   Substance and Sexual Activity  Alcohol Use Never   Comment: occassional      Social History   Substance and Sexual Activity  Drug Use Yes  . Frequency: 7.0 times per week  . Types: Marijuana   Comment: smoked 2-3 days ago, usually smokes daily    Social History   Socioeconomic History  . Marital status: Single    Spouse name: Not on file  . Number of children: Not on file  . Years of education: Not on file  . Highest education level: Not on file  Occupational History  . Not on file  Tobacco Use  . Smoking status: Current Every Day Smoker    Packs/day: 1.00    Years: 20.00    Pack years: 20.00    Types: Cigarettes  . Smokeless tobacco: Never Used  Substance and Sexual Activity  . Alcohol use: Never    Comment: occassional   . Drug use: Yes    Frequency: 7.0 times per week    Types: Marijuana    Comment:  smoked 2-3 days ago, usually smokes daily  . Sexual activity: Not Currently  Other Topics Concern  . Not on file  Social History Narrative  . Not on file   Social Determinants of Health   Financial Resource Strain:   . Difficulty of Paying Living Expenses:   Food Insecurity:   . Worried About Programme researcher, broadcasting/film/video in the Last Year:   . Barista in the Last Year:   Transportation Needs:   . Freight forwarder (Medical):   Marland Kitchen Lack of Transportation (Non-Medical):   Physical Activity:   . Days of Exercise per Week:   . Minutes of Exercise per Session:   Stress:   . Feeling of Stress :   Social Connections:   . Frequency of Communication with Friends and  Family:   . Frequency of Social Gatherings with Friends and Family:   . Attends Religious Services:   . Active Member of Clubs or Organizations:   . Attends Banker Meetings:   Marland Kitchen Marital Status:    Additional Social History:    Pain Medications: see MAR Prescriptions: ee MAR Over the Counter: see MAR History of alcohol / drug use?: Yes Longest period of sobriety (when/how long): unsure Negative Consequences of Use: Financial Withdrawal Symptoms: Agitation, Irritability                    Sleep: Fair  Appetite:  Good  Current Medications: Current Facility-Administered Medications  Medication Dose Route Frequency Provider Last Rate Last Admin  . acetaminophen (TYLENOL) tablet 650 mg  650 mg Oral Q6H PRN Patrcia Dolly, FNP      . atorvastatin (LIPITOR) tablet 20 mg  20 mg Oral Daily Cobos, Rockey Situ, MD   20 mg at 03/14/20 0900  . [START ON 03/15/2020] divalproex (DEPAKOTE) DR tablet 500 mg  500 mg Oral Daily Brayden Betters, Feliz Beam B, FNP      . divalproex (DEPAKOTE) DR tablet 750 mg  750 mg Oral QPM Diogo Anne, Feliz Beam B, FNP      . escitalopram (LEXAPRO) tablet 10 mg  10 mg Oral Daily Cobos, Rockey Situ, MD   10 mg at 03/14/20 0900  . insulin aspart (novoLOG) injection 0-5 Units  0-5 Units Subcutaneous QHS Cobos, Rockey Situ, MD   2 Units at 03/13/20 2119  . insulin aspart (novoLOG) injection 0-9 Units  0-9 Units Subcutaneous TID WC Cobos, Rockey Situ, MD   3 Units at 03/14/20 1234  . insulin aspart (novoLOG) injection 10 Units  10 Units Subcutaneous TID WC Cobos, Rockey Situ, MD   10 Units at 03/14/20 1234  . insulin detemir (LEVEMIR) injection 35 Units  35 Units Subcutaneous QHS Adysen Raphael B, FNP      . levothyroxine (SYNTHROID) tablet 100 mcg  100 mcg Oral Q0600 Cobos, Rockey Situ, MD   100 mcg at 03/14/20 0605  . melatonin tablet 6 mg  6 mg Oral QHS PRN Patrcia Dolly, FNP   6 mg at 03/13/20 2118  . polyethylene glycol (MIRALAX / GLYCOLAX) packet 17 g  17 g Oral Daily Patrcia Dolly, FNP      . pregabalin (LYRICA) capsule 150 mg  150 mg Oral BID Cobos, Rockey Situ, MD   150 mg at 03/14/20 0900  . QUEtiapine (SEROQUEL) tablet 200 mg  200 mg Oral QHS Demerius Podolak B, FNP      . traZODone (DESYREL) tablet 50 mg  50 mg Oral QHS PRN,MR X 1  Jackelyn Poling, NP        Lab Results:  Results for orders placed or performed during the hospital encounter of 03/13/20 (from the past 48 hour(s))  Glucose, capillary     Status: Abnormal   Collection Time: 03/13/20  5:20 PM  Result Value Ref Range   Glucose-Capillary 157 (H) 70 - 99 mg/dL    Comment: Glucose reference range applies only to samples taken after fasting for at least 8 hours.  Glucose, capillary     Status: Abnormal   Collection Time: 03/13/20  8:55 PM  Result Value Ref Range   Glucose-Capillary 225 (H) 70 - 99 mg/dL    Comment: Glucose reference range applies only to samples taken after fasting for at least 8 hours.  Glucose, capillary     Status: Abnormal   Collection Time: 03/14/20  5:55 AM  Result Value Ref Range   Glucose-Capillary 406 (H) 70 - 99 mg/dL    Comment: Glucose reference range applies only to samples taken after fasting for at least 8 hours.   Comment 1 Notify RN   Glucose, capillary     Status: Abnormal   Collection Time: 03/14/20 12:29 PM  Result Value Ref Range   Glucose-Capillary 220 (H) 70 - 99 mg/dL    Comment: Glucose reference range applies only to samples taken after fasting for at least 8 hours.    Blood Alcohol level:  Lab Results  Component Value Date   ETH <10 03/05/2020   ETH <10 01/02/2020    Metabolic Disorder Labs: Lab Results  Component Value Date   HGBA1C 8.8 (H) 02/03/2020   MPG 206 02/03/2020   MPG 194.38 12/29/2019   No results found for: PROLACTIN Lab Results  Component Value Date   CHOL 214 (H) 03/05/2020   TRIG 81 03/05/2020   HDL 45 03/05/2020   CHOLHDL 4.8 03/05/2020   VLDL 16 03/05/2020   LDLCALC 153 (H) 03/05/2020   LDLCALC 141 (H) 09/11/2017     Physical Findings: AIMS: Facial and Oral Movements Muscles of Facial Expression: None, normal Lips and Perioral Area: None, normal Jaw: None, normal Tongue: None, normal,Extremity Movements Upper (arms, wrists, hands, fingers): None, normal Lower (legs, knees, ankles, toes): None, normal, Trunk Movements Neck, shoulders, hips: None, normal, Overall Severity Severity of abnormal movements (highest score from questions above): None, normal Incapacitation due to abnormal movements: None, normal Patient's awareness of abnormal movements (rate only patient's report): No Awareness, Dental Status Current problems with teeth and/or dentures?: Yes(needs to see dentist) Does patient usually wear dentures?: No  CIWA:  CIWA-Ar Total: 6 COWS:  COWS Total Score: 3  Musculoskeletal: Strength & Muscle Tone: within normal limits Gait & Station: broad based Patient leans: N/A  Psychiatric Specialty Exam: Physical Exam  Nursing note and vitals reviewed. Constitutional: He is oriented to person, place, and time. He appears well-developed and well-nourished.  Cardiovascular: Normal rate.  Respiratory: Effort normal.  Musculoskeletal:        General: Normal range of motion.  Neurological: He is alert and oriented to person, place, and time.  Skin: Skin is warm.    Review of Systems  Constitutional: Negative.   HENT: Negative.   Eyes: Negative.   Respiratory: Negative.   Cardiovascular: Negative.   Gastrointestinal: Negative.   Genitourinary: Negative.   Musculoskeletal: Negative.   Skin: Negative.   Neurological: Negative.   Psychiatric/Behavioral: Positive for agitation.    Blood pressure 114/80, pulse 99, temperature 97.8 F (36.6 C), temperature source Oral,  resp. rate 18, SpO2 95 %.There is no height or weight on file to calculate BMI.  General Appearance: Disheveled  Eye Contact:  Good  Speech:  Clear and Coherent and Normal Rate  Volume:  Normal  Mood:  Irritable  Affect:   Flat  Thought Process:  Coherent and Descriptions of Associations: Intact  Orientation:  Full (Time, Place, and Person)  Thought Content:  WDL  Suicidal Thoughts:  No  Homicidal Thoughts:  No  Memory:  Immediate;   Good Recent;   Good Remote;   Fair  Judgement:  Fair  Insight:  Fair  Psychomotor Activity:  Normal  Concentration:  Concentration: Fair  Recall:  [employer] of Knowledge:  Fair  Language:  Fair  Akathisia:  No  Handed:  Right  AIMS (if indicated):     Assets:  Communication Skills Desire for Improvement Housing Resilience Social Support  ADL's:  Intact  Cognition:  WNL  Sleep:  Number of Hours: 5   Assessment: Patient presents in his room lying in the bed but is awake.  Patient has flat affect and he is pleasant, calm, cooperative during the evaluation.  However patient has been agitated a few times today and has been disrespectful to staff.  It appears that when patient does not get what he requests at the time he requests that he becomes agitated.  Did review his medications and consult with Dr. Mallie Darting.  Valproic acid level will be drawn tomorrow morning.  His Depakote will be increased to 500 mg every morning and 750 mg q. evening and we will increase his Seroquel to 200 mg nightly.  Continued monitoring of the patient's blood sugars have they have remained elevated for the most part of his stay due to the increase in his Seroquel.  We will hold on increasing Lyrica at this time.  Treatment Plan Summary: Daily contact with patient to assess and evaluate symptoms and progress in treatment and Medication management Increase Depakote DR 500 mg p.o. daily and 750 mg q. evening for mood stability Continue Lexapro 10 mg p.o. daily for depression Continue NovoLog nightly sliding scale Continue NovoLog 3 times daily with meals sliding scale Continue NovoLog 10 units subcu 3 times daily with meals Change Levemir to 35 units subcu at bedtime Continue Synthroid 100 mcg p.o.  daily for hypothyroidism Continue melatonin 6 mg p.o. nightly as needed for insomnia Continue Lyrica 150 mg p.o. twice daily Increase Seroquel 200 mg p.o. nightly Continue trazodone 50 mg p.o. nightly as needed for insomnia Encourage group therapy participation Continue every 15 minute safety checks  Lewis Shock, FNP 03/14/2020, 1:42 PM

## 2020-03-14 NOTE — Progress Notes (Signed)
Inpatient Diabetes Program Recommendations  AACE/ADA: New Consensus Statement on Inpatient Glycemic Control (2015)  Target Ranges:  Prepandial:   less than 140 mg/dL      Peak postprandial:   less than 180 mg/dL (1-2 hours)      Critically ill patients:  140 - 180 mg/dL   Lab Results  Component Value Date   GLUCAP 406 (H) 03/14/2020   HGBA1C 8.8 (H) 02/03/2020    Review of Glycemic Control  Diabetes history: DM1 Outpatient Diabetes medications: 70/30 35 units bid, Novolog 0-12 units tidwc (pt reports not taking) Current orders for Inpatient glycemic control: Levemir 35 units QD, Novolog 0-9 units tidwc and hs + 10 units tidwc  CBG 406 mg/dL this am.   Inpatient Diabetes Program Recommendations:     Increase Levemir to 20 units bid, starting 5/24 am.  Continue to monitor and follow closely.  Thank you. Ailene Ards, RD, LDN, CDE Inpatient Diabetes Coordinator [phone]

## 2020-03-14 NOTE — Progress Notes (Signed)
Patient stated he takes levemir at night 30 units approximately 2 yrs.  Patient refused levemir this morning.  Stated "I am tired of urinating in the bed."  Patient also refused miralax this morning, did not need that med.

## 2020-03-14 NOTE — [facility] Suicide Risk Assessment (Signed)
[facility] INPATIENT:  Family/Significant Other Suicide Prevention Education  Suicide Prevention Education:  Patient Refusal for Family/Significant Other Suicide Prevention Education: The patient Mark [name] has refused to provide written consent for family/significant other to be provided Family/Significant Other Suicide Prevention Education during admission and/or prior to discharge.  Physician notified.  Darreld Mclean 03/14/2020, 10:45 AM

## 2020-03-14 NOTE — Tx Team (Signed)
Interdisciplinary Treatment and Diagnostic Plan Update  03/14/2020 Time of Session: 9:10am PLACIDO [name] MRN: [id number]  Principal Diagnosis: <principal problem not specified>  Secondary Diagnoses: Active Problems:   MDD (major depressive disorder), recurrent episode, severe (HCC)   Current Medications:  Current Facility-Administered Medications  Medication Dose Route Frequency Provider Last Rate Last Admin  . acetaminophen (TYLENOL) tablet 650 mg  650 mg Oral Q6H PRN Emmaline Kluver, FNP      . atorvastatin (LIPITOR) tablet 20 mg  20 mg Oral Daily Cobos, Myer Peer, MD   20 mg at 03/14/20 0900  . divalproex (DEPAKOTE) DR tablet 500 mg  500 mg Oral Q12H Cobos, Myer Peer, MD   500 mg at 03/14/20 0900  . escitalopram (LEXAPRO) tablet 10 mg  10 mg Oral Daily Cobos, Myer Peer, MD   10 mg at 03/14/20 0900  . insulin aspart (novoLOG) injection 0-5 Units  0-5 Units Subcutaneous QHS Cobos, Myer Peer, MD   2 Units at 03/13/20 2119  . insulin aspart (novoLOG) injection 0-9 Units  0-9 Units Subcutaneous TID WC Cobos, Myer Peer, MD   9 Units at 03/14/20 0604  . insulin aspart (novoLOG) injection 10 Units  10 Units Subcutaneous TID WC Cobos, Myer Peer, MD   10 Units at 03/14/20 0604  . insulin detemir (LEVEMIR) injection 35 Units  35 Units Subcutaneous Daily Cobos, Fernando A, MD      . levothyroxine (SYNTHROID) tablet 100 mcg  100 mcg Oral Q0600 Cobos, Myer Peer, MD   100 mcg at 03/14/20 0605  . melatonin tablet 6 mg  6 mg Oral QHS PRN Emmaline Kluver, FNP   6 mg at 03/13/20 2118  . polyethylene glycol (MIRALAX / GLYCOLAX) packet 17 g  17 g Oral Daily Emmaline Kluver, FNP      . pregabalin (LYRICA) capsule 150 mg  150 mg Oral BID Cobos, Myer Peer, MD   150 mg at 03/14/20 0900  . QUEtiapine (SEROQUEL) tablet 100 mg  100 mg Oral QHS Cobos, Myer Peer, MD   100 mg at 03/13/20 2118  . traZODone (DESYREL) tablet 50 mg  50 mg Oral QHS PRN,MR X 1 Lindon Romp A, NP       PTA Medications: Medications  Prior to Admission  Medication Sig Dispense Refill Last Dose  . atorvastatin (LIPITOR) 20 MG tablet Take 1 tablet (20 mg total) by mouth daily.     . blood glucose meter kit and supplies KIT Dispense based on patient and insurance preference. Use up to four times daily as directed. (FOR ICD-9 250.00, 250.01). 1 each 0   . divalproex (DEPAKOTE) 500 MG DR tablet Take 1 tablet (500 mg total) by mouth every 12 (twelve) hours. 60 tablet 0   . escitalopram (LEXAPRO) 10 MG tablet Take 1 tablet (10 mg total) by mouth daily. 30 tablet 0   . insulin aspart (NOVOLOG) 100 UNIT/ML injection Inject 0-9 Units into the skin 4 (four) times daily -  before meals and at bedtime. 10 mL 11   . insulin aspart (NOVOLOG) 100 UNIT/ML injection Inject 10 Units into the skin 3 (three) times daily with meals. 10 mL 0   . insulin detemir (LEVEMIR) 100 UNIT/ML injection Inject 0.35 mLs (35 Units total) into the skin daily. 10 mL 0   . Insulin Pen Needle (PEN NEEDLES 3/16") 31G X 5 MM MISC 1 Dose by Does not apply route 3 (three) times daily. 100 each 0   . levothyroxine (SYNTHROID) 100  MCG tablet Take 1 tablet (100 mcg total) by mouth daily before breakfast. 30 tablet 0   . melatonin 3 MG TABS tablet Take 2 tablets (6 mg total) by mouth at bedtime as needed (sleep).  0   . pregabalin (LYRICA) 150 MG capsule Take 1 capsule (150 mg total) by mouth 2 (two) times daily. 60 capsule 0   . QUEtiapine (SEROQUEL) 100 MG tablet Take 1 tablet (100 mg total) by mouth at bedtime. 30 tablet 0   . traZODone (DESYREL) 50 MG tablet Take 1 tablet (50 mg total) by mouth at bedtime. 30 tablet 0     Patient Stressors: Health problems Substance abuse  Patient Strengths: Armed forces logistics/support/administrative officer Supportive family/friends Work skills  Treatment Modalities: Medication Management, Group therapy, Case management,  1 to 1 session with clinician, Psychoeducation, Recreational therapy.   Physician Treatment Plan for Primary Diagnosis: <principal  problem not specified> Long Term Goal(s): Improvement in symptoms so as ready for discharge Improvement in symptoms so as ready for discharge   Short Term Goals: Ability to identify changes in lifestyle to reduce recurrence of condition will improve Ability to verbalize feelings will improve Ability to disclose and discuss suicidal ideas Ability to demonstrate self-control will improve Ability to identify and develop effective coping behaviors will improve Ability to maintain clinical measurements within normal limits will improve Ability to identify changes in lifestyle to reduce recurrence of condition will improve Ability to verbalize feelings will improve Ability to disclose and discuss suicidal ideas Ability to demonstrate self-control will improve Ability to identify and develop effective coping behaviors will improve Ability to maintain clinical measurements within normal limits will improve Compliance with prescribed medications will improve  Medication Management: Evaluate patient's response, side effects, and tolerance of medication regimen.  Therapeutic Interventions: 1 to 1 sessions, Unit Group sessions and Medication administration.  Evaluation of Outcomes: Not Met  Physician Treatment Plan for Secondary Diagnosis: Active Problems:   MDD (major depressive disorder), recurrent episode, severe ([place])  Long Term Goal(s): Improvement in symptoms so as ready for discharge Improvement in symptoms so as ready for discharge   Short Term Goals: Ability to identify changes in lifestyle to reduce recurrence of condition will improve Ability to verbalize feelings will improve Ability to disclose and discuss suicidal ideas Ability to demonstrate self-control will improve Ability to identify and develop effective coping behaviors will improve Ability to maintain clinical measurements within normal limits will improve Ability to identify changes in lifestyle to reduce recurrence of  condition will improve Ability to verbalize feelings will improve Ability to disclose and discuss suicidal ideas Ability to demonstrate self-control will improve Ability to identify and develop effective coping behaviors will improve Ability to maintain clinical measurements within normal limits will improve Compliance with prescribed medications will improve     Medication Management: Evaluate patient's response, side effects, and tolerance of medication regimen.  Therapeutic Interventions: 1 to 1 sessions, Unit Group sessions and Medication administration.  Evaluation of Outcomes: Not Met   RN Treatment Plan for Primary Diagnosis: <principal problem not specified> Long Term Goal(s): Knowledge of disease and therapeutic regimen to maintain health will improve  Short Term Goals: Ability to remain free from injury will improve, Ability to disclose and discuss suicidal ideas, Ability to identify and develop effective coping behaviors will improve and Compliance with prescribed medications will improve  Medication Management: RN will administer medications as ordered by provider, will assess and evaluate patient's response and provide education to patient for prescribed medication. RN will  report any adverse and/or side effects to prescribing provider.  Therapeutic Interventions: 1 on 1 counseling sessions, Psychoeducation, Medication administration, Evaluate responses to treatment, Monitor vital signs and CBGs as ordered, Perform/monitor CIWA, COWS, AIMS and Fall Risk screenings as ordered, Perform wound care treatments as ordered.  Evaluation of Outcomes: Not Met   LCSW Treatment Plan for Primary Diagnosis: <principal problem not specified> Long Term Goal(s): Safe transition to appropriate next level of care at discharge, Engage patient in therapeutic group addressing interpersonal concerns.  Short Term Goals: Engage patient in aftercare planning with referrals and resources, Increase  social support, Identify triggers associated with mental health/substance abuse issues and Increase skills for wellness and recovery  Therapeutic Interventions: Assess for all discharge needs, 1 to 1 time with Social worker, Explore available resources and support systems, Assess for adequacy in community support network, Educate family and significant other(s) on suicide prevention, Complete Psychosocial Assessment, Interpersonal group therapy.  Evaluation of Outcomes: Not Met   Progress in Treatment: Attending groups: Yes. Participating in groups: Yes. Taking medication as prescribed: Yes. Toleration medication: Yes. Family/Significant other contact made: No, will contact:  once consents are received Patient understands diagnosis: Yes. Discussing patient identified problems/goals with staff: Yes. Medical problems stabilized or resolved: No. Denies suicidal/homicidal ideation: Yes. Issues/concerns per patient self-inventory: No. Other: none  New problem(s) identified: No, Describe:  none noted  New Short Term/Long Term Goal(s): detox, medication management for mood stabilization; elimination of SI thoughts; development of comprehensive mental wellness/sobriety plan  Patient Goals:  "To get back on all my medicine"  Discharge Plan or Barriers: none noted  Reason for Continuation of Hospitalization: Depression Medication stabilization  Estimated Length of Stay: 3-5 days  Attendees: Patient: Mark [name] 03/14/2020   Physician:  03/14/2020   Nursing:  03/14/2020  RN Care Manager: 03/14/2020   Social Worker: Darletta Moll, Latanya Presser 03/14/2020   Recreational Therapist:  03/14/2020   Other: Marvia Pickles, [place] 03/14/2020   Other:  03/14/2020   Other: 03/14/2020     Scribe for Treatment Team: Vassie Moselle, [place] 03/14/2020 9:50 AM
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
## 2020-03-14 NOTE — Progress Notes (Signed)
Recreation Therapy Notes  Date:  5.24.21 Time: 0930 Location: [address] Dayroom  Group Topic: Stress Management  Goal Area(s) Addresses:  Patient will identify positive stress management techniques. Patient will identify benefits of using stress management post d/c.  Intervention: Stress Management  Activity: [employer].  LRT played a meditation that focused on taking the characteristics of a mountain into your meditation and life.  Patients were to listen and follow along as meditation played.    Education:  Stress Management, Discharge Planning.   Education Outcome: Acknowledges Education  Clinical Observations/Feedback: Pt did not attend group activity.    Caroll Rancher, LRT/CTRS         Caroll Rancher A 03/14/2020 11:19 AM

## 2020-03-15 LAB — GLUCOSE, CAPILLARY
Glucose-Capillary: 269 mg/dL — ABNORMAL HIGH (ref 70–99)
Glucose-Capillary: 206 mg/dL — ABNORMAL HIGH (ref 70–99)
Glucose-Capillary: 342 mg/dL — ABNORMAL HIGH (ref 70–99)
Glucose-Capillary: 191 mg/dL — ABNORMAL HIGH (ref 70–99)

## 2020-03-15 LAB — VALPROIC ACID LEVEL: Valproic Acid Lvl: 64 ug/mL (ref 50.0–100.0)

## 2020-03-15 MED ORDER — QUETIAPINE FUMARATE 50 MG PO TABS
250.0000 mg | ORAL_TABLET | Freq: Every day | ORAL | Status: DC
  Administered 2020-03-15: 250 mg via ORAL
  Filled 2020-03-15 (×2): qty 1

## 2020-03-15 MED ORDER — INSULIN DETEMIR 100 UNIT/ML ~~LOC~~ SOLN
40.0000 [IU] | Freq: Every day | SUBCUTANEOUS | Status: DC
  Administered 2020-03-15 – 2020-03-17 (×3): 40 [IU] via SUBCUTANEOUS

## 2020-03-15 MED ORDER — TRAZODONE HCL 50 MG PO TABS
50.0000 mg | ORAL_TABLET | Freq: Every evening | ORAL | Status: DC | PRN
  Administered 2020-03-15 – 2020-03-16 (×2): 50 mg via ORAL
  Filled 2020-03-15 (×2): qty 1

## 2020-03-15 NOTE — Progress Notes (Signed)
   03/15/20 2100  Psych Admission Type (Psych Patients Only)  Admission Status Involuntary  Psychosocial Assessment  Patient Complaints Depression  Eye Contact Fair;Brief  Facial Expression Flat  Affect Labile;Depressed  Speech Logical/coherent  Interaction Assertive  Motor Activity Slow  Appearance/Hygiene Unremarkable  Behavior Characteristics Cooperative;Anxious  Mood Depressed;Anxious  Thought Process  Coherency WDL  Content WDL  Delusions None reported or observed  Perception WDL  Hallucination None reported or observed  Judgment Limited  Confusion None  Danger to Self  Current suicidal ideation? Passive  Agreement Not to Harm Self Yes  Description of Agreement contracts for safety verbal  Danger to Others  Danger to Others None reported or observed   Pt seen in his room. Pt endorses passive SI but states he has no plan to do anything. Pt verbally contracts for safety. Pt CBG was 191 this evening so did not need SSI.

## 2020-03-15 NOTE — BHH Group Notes (Signed)
Pt did not attend wrap up group this evening. Pt was in bed resting. 

## 2020-03-15 NOTE — Progress Notes (Signed)
Inpatient Diabetes Program Recommendations  AACE/ADA: New Consensus Statement on Inpatient Glycemic Control (2015)  Target Ranges:  Prepandial:   less than 140 mg/dL      Peak postprandial:   less than 180 mg/dL (1-2 hours)      Critically ill patients:  140 - 180 mg/dL   Lab Results  Component Value Date   GLUCAP 206 (H) 03/15/2020   HGBA1C 8.8 (H) 02/03/2020    Review of Glycemic Control  Spoke with Dr Jama Flavors about recs in progress note.  Will increase Levemir to 40 units QHS and Novolog to 10 units tidwc. Continues with Novolog 0-9 units tidwc and hs  CBGs today - 269, 206 mg/dL.  Inpatient Diabetes Program Recommendations:     Continue to follow daily trends.   Thank you. Ailene Ards, RD, LDN, CDE Inpatient Diabetes Coordinator [phone]

## 2020-03-15 NOTE — Progress Notes (Signed)
Pt resting in bed through night with with eyes closed unlabored respirations. Pt again had episode of urinary incontinence while sleeping through night. Staff assisted in cleaning up. Safety maintained with q15 min rounds. Monitoring continues.

## 2020-03-15 NOTE — Progress Notes (Signed)
[facility] MD Progress Note  03/15/2020 12:54 PM THAILAN SAVA  MRN:  [id number]   Subjective: patient reports he continues to feel depressed . Denies suicidal ideations at this time ( and contracts for safety on unit). Denies medication side effects at this time.  Objective : I have discussed case with treatment team and have met with patient. 34 year old male, presented to the emergency room on 5/13 following a suicide attempt by overdosing on insulin (patient is diabetic, on insulin management).  He describes overdose as impulsive, unplanned and in the context of feeling depressed.  He was initially admitted to medical unit for stabilization.  He reports history of bipolar disorder diagnoses and describes episodes of depression and also brief episodes of increased energy and decreased need for sleep. He reports he has felt depressed over recent months and identifies protruding stressors as death of his mother 2 years ago, death of a pet dog 2 years ago, recent relocation from [place] several months ago after which he ran out of his prescribed psychiatric medications several weeks ago.  Patient presents alert, attentive, without psychomotor agitation or restlessness. He is oriented x 3. He remains depressed and presents vaguely dysphoric and irritable. He reports he has had some passive thoughts of death/dying but none today and at this time denies suicidal ideations and contracts for safety on unit . Denies medication side effects, but states " I am not on the right doses". Explains he has been on Seroquel for close to two years and was taking at 300 mgrs QHS without side effects. He also states he was taking higher doses of Lyrica. We have reviewed side effect profile for these medications including potential risk for sedation and for increased appetite/weight/metabolic disturbances. He reports he was tolerating Seroquel well and prefers to continue this medication.  I have reviewed DM management with DM  Coordinator/consultant, Ms. Deirdre Pippins- CBGs today 269 and 206.  Recommendation is to increase Insulin Detemir from 35 units QDAY to 40 units QDAY , and continue current standing Novolog with meals and sliding scale . Staff reports patient has had episodes of urinary incontinence at night. Patient reports " that is kind of new" and attributes to underlying DM. Denies urgency, dysuria. Vitals today Temp 98.1, pulse 80, BP 92/62.   Principal Problem: MDD (major depressive disorder), recurrent episode, severe ([place]) Diagnosis: Principal Problem:   MDD (major depressive disorder), recurrent episode, severe ([place])  Total Time spent with patient: 20 minutes  Past Psychiatric History: history of past psychiatric admissions, most recently 6 months ago, for depression, suicidal attempt by overdose . He reports history of several suicide attempts starting in 2008. Denies history of self cutting. Reports he has been diagnosed with Bipolar Disorder and describes brief episodes of increased energy and decreased need for sleep.  Denies history of psychosis.  Past Medical History:  Past Medical History:  Diagnosis Date  . Anxiety   . Bipolar disorder ([place])   . Depression   . Diabetes mellitus without complication ([name])   . Diabetic polyneuropathy associated with type 1 diabetes mellitus ([place])   . Hydrocephalus ([place])   . Kidney stones   . Seizures ([place])     Past Surgical History:  Procedure Laterality Date  . KIDNEY STONE SURGERY     Family History:  Family History  Problem Relation Age of Onset  . Breast cancer Mother    Family Psychiatric  History: aunt has bipolar disorder, sister has history of depression. A maternal great uncle committed suicide  Social History:  Social History   Substance and Sexual Activity  Alcohol Use Never   Comment: occassional      Social History   Substance and Sexual Activity  Drug Use Yes  . Frequency: 7.0 times per week  . Types: Marijuana   Comment: smoked  2-3 days ago, usually smokes daily    Social History   Socioeconomic History  . Marital status: Single    Spouse name: Not on file  . Number of children: Not on file  . Years of education: Not on file  . Highest education level: Not on file  Occupational History  . Not on file  Tobacco Use  . Smoking status: Current Every Day Smoker    Packs/day: 1.00    Years: 20.00    Pack years: 20.00    Types: Cigarettes  . Smokeless tobacco: Never Used  Substance and Sexual Activity  . Alcohol use: Never    Comment: occassional   . Drug use: Yes    Frequency: 7.0 times per week    Types: Marijuana    Comment: smoked 2-3 days ago, usually smokes daily  . Sexual activity: Not Currently  Other Topics Concern  . Not on file  Social History Narrative  . Not on file   Social Determinants of Health   Financial Resource Strain:   . Difficulty of Paying Living Expenses:   Food Insecurity:   . Worried About Charity fundraiser in the Last Year:   . Arboriculturist in the Last Year:   Transportation Needs:   . Film/video editor (Medical):   Marland Kitchen Lack of Transportation (Non-Medical):   Physical Activity:   . Days of Exercise per Week:   . Minutes of Exercise per Session:   Stress:   . Feeling of Stress :   Social Connections:   . Frequency of Communication with Friends and Family:   . Frequency of Social Gatherings with Friends and Family:   . Attends Religious Services:   . Active Member of Clubs or Organizations:   . Attends Archivist Meetings:   Marland Kitchen Marital Status:    Additional Social History:    Pain Medications: see MAR Prescriptions: ee MAR Over the Counter: see MAR History of alcohol / drug use?: Yes Longest period of sobriety (when/how long): unsure Negative Consequences of Use: Financial Withdrawal Symptoms: Agitation, Irritability  Sleep: Fair  Appetite:  Good  Current Medications: Current Facility-Administered Medications  Medication Dose Route  Frequency Provider Last Rate Last Admin  . acetaminophen (TYLENOL) tablet 650 mg  650 mg Oral Q6H PRN Emmaline Kluver, FNP   650 mg at 03/14/20 1956  . atorvastatin (LIPITOR) tablet 20 mg  20 mg Oral Daily Jezebelle Ledwell, Myer Peer, MD   20 mg at 03/15/20 0827  . divalproex (DEPAKOTE) DR tablet 500 mg  500 mg Oral Daily Money, Lowry Ram, FNP   500 mg at 03/15/20 0827  . divalproex (DEPAKOTE) DR tablet 750 mg  750 mg Oral QPM Money, Darnelle Maffucci B, FNP   750 mg at 03/14/20 1701  . escitalopram (LEXAPRO) tablet 10 mg  10 mg Oral Daily Allana Shrestha, Myer Peer, MD   10 mg at 03/15/20 0827  . insulin aspart (novoLOG) injection 0-5 Units  0-5 Units Subcutaneous QHS Esperansa Sarabia, Myer Peer, MD   2 Units at 03/14/20 2125  . insulin aspart (novoLOG) injection 0-9 Units  0-9 Units Subcutaneous TID WC Margie Urbanowicz, Myer Peer, MD   3 Units at  03/15/20 1143  . insulin aspart (novoLOG) injection 10 Units  10 Units Subcutaneous TID WC Ephram Kornegay, Myer Peer, MD   10 Units at 03/15/20 1142  . insulin detemir (LEVEMIR) injection 35 Units  35 Units Subcutaneous QHS Money, Lowry Ram, [place]   35 Units at 03/14/20 2124  . levothyroxine (SYNTHROID) tablet 100 mcg  100 mcg Oral Q0600 Kloie Whiting, Myer Peer, MD   100 mcg at 03/15/20 0645  . melatonin tablet 6 mg  6 mg Oral QHS PRN Emmaline Kluver, FNP   6 mg at 03/14/20 2124  . nicotine polacrilex (NICORETTE) gum 2 mg  2 mg Oral PRN Money, Lowry Ram, FNP   2 mg at 03/14/20 1957  . polyethylene glycol (MIRALAX / GLYCOLAX) packet 17 g  17 g Oral Daily Emmaline Kluver, FNP      . pregabalin (LYRICA) capsule 150 mg  150 mg Oral BID Miriam Kestler, Myer Peer, MD   150 mg at 03/15/20 0829  . QUEtiapine (SEROQUEL) tablet 200 mg  200 mg Oral QHS Money, Lowry Ram, FNP   200 mg at 03/14/20 2123  . traZODone (DESYREL) tablet 50 mg  50 mg Oral QHS PRN,MR X 1 Lindon Romp A, NP   50 mg at 03/14/20 2124    Lab Results:  Results for orders placed or performed during the hospital encounter of 03/13/20 (from the past 48 hour(s))  Glucose, capillary      Status: Abnormal   Collection Time: 03/13/20  5:20 PM  Result Value Ref Range   Glucose-Capillary 157 (H) 70 - 99 mg/dL    Comment: Glucose reference range applies only to samples taken after fasting for at least 8 hours.  Glucose, capillary     Status: Abnormal   Collection Time: 03/13/20  8:55 PM  Result Value Ref Range   Glucose-Capillary 225 (H) 70 - 99 mg/dL    Comment: Glucose reference range applies only to samples taken after fasting for at least 8 hours.  Glucose, capillary     Status: Abnormal   Collection Time: 03/14/20  5:55 AM  Result Value Ref Range   Glucose-Capillary 406 (H) 70 - 99 mg/dL    Comment: Glucose reference range applies only to samples taken after fasting for at least 8 hours.   Comment 1 Notify RN   Glucose, capillary     Status: Abnormal   Collection Time: 03/14/20 12:29 PM  Result Value Ref Range   Glucose-Capillary 220 (H) 70 - 99 mg/dL    Comment: Glucose reference range applies only to samples taken after fasting for at least 8 hours.  Glucose, capillary     Status: Abnormal   Collection Time: 03/14/20  4:54 PM  Result Value Ref Range   Glucose-Capillary 249 (H) 70 - 99 mg/dL    Comment: Glucose reference range applies only to samples taken after fasting for at least 8 hours.  Glucose, capillary     Status: Abnormal   Collection Time: 03/14/20  8:45 PM  Result Value Ref Range   Glucose-Capillary 256 (H) 70 - 99 mg/dL    Comment: Glucose reference range applies only to samples taken after fasting for at least 8 hours.   Comment 1 Notify RN    Comment 2 Document in Chart   Glucose, capillary     Status: Abnormal   Collection Time: 03/15/20  6:05 AM  Result Value Ref Range   Glucose-Capillary 269 (H) 70 - 99 mg/dL    Comment: Glucose reference range applies  only to samples taken after fasting for at least 8 hours.  Valproic acid level     Status: None   Collection Time: 03/15/20  6:34 AM  Result Value Ref Range   Valproic Acid Lvl 64 50.0 -  100.0 ug/mL    Comment: Performed at [facility], [place] [address] [place], [place] [address]  Glucose, capillary     Status: Abnormal   Collection Time: 03/15/20 11:35 AM  Result Value Ref Range   Glucose-Capillary 206 (H) 70 - 99 mg/dL    Comment: Glucose reference range applies only to samples taken after fasting for at least 8 hours.    Blood Alcohol level:  Lab Results  Component Value Date   ETH <10 03/05/2020   ETH <10 81/27/5170    Metabolic Disorder Labs: Lab Results  Component Value Date   HGBA1C 8.8 (H) 02/03/2020   MPG 206 02/03/2020   MPG 194.38 12/29/2019   No results found for: PROLACTIN Lab Results  Component Value Date   CHOL 214 (H) 03/05/2020   TRIG 81 03/05/2020   HDL 45 03/05/2020   CHOLHDL 4.8 03/05/2020   VLDL 16 03/05/2020   LDLCALC 153 (H) 03/05/2020   LDLCALC 141 (H) 09/11/2017    Physical Findings: AIMS: Facial and Oral Movements Muscles of Facial Expression: None, normal Lips and Perioral Area: None, normal Jaw: None, normal Tongue: None, normal,Extremity Movements Upper (arms, wrists, hands, fingers): None, normal Lower (legs, knees, ankles, toes): None, normal, Trunk Movements Neck, shoulders, hips: None, normal, Overall Severity Severity of abnormal movements (highest score from questions above): None, normal Incapacitation due to abnormal movements: None, normal Patient's awareness of abnormal movements (rate only patient's report): No Awareness, Dental Status Current problems with teeth and/or dentures?: Yes(needs to see dentist) Does patient usually wear dentures?: No  CIWA:  CIWA-Ar Total: 6 COWS:  COWS Total Score: 3  Musculoskeletal: Strength & Muscle Tone: within normal limits Gait & Station: broad based Patient leans: N/A  Psychiatric Specialty Exam: Physical Exam  Nursing note and vitals reviewed. Constitutional: He is oriented to person, place, and time. He appears well-developed and  well-nourished.  Cardiovascular: Normal rate.  Respiratory: Effort normal.  Musculoskeletal:        General: Normal range of motion.  Neurological: He is alert and oriented to person, place, and time.  Skin: Skin is warm.    Review of Systems  Constitutional: Negative.   HENT: Negative.   Eyes: Negative.   Respiratory: Negative.   Cardiovascular: Negative.   Gastrointestinal: Negative.   Genitourinary: Negative.   Musculoskeletal: Negative.   Skin: Negative.   Neurological: Negative.   Psychiatric/Behavioral: Positive for agitation.  denies headache, no chest pain, no shortness of breath at room air, denies urgency or dysuria  Blood pressure 97/74, pulse 99, temperature 98.1 F (36.7 C), temperature source Oral, resp. rate 20, SpO2 98 %.There is no height or weight on file to calculate BMI.  General Appearance: Fairly Groomed  Eye Contact:  Good  Speech:  Normal Rate  Volume:  Normal  Mood:  reports still feeling depressed   Affect:  constricted, vaguely irritable   Thought Process:  Linear and Descriptions of Associations: Intact  Orientation:  Other:  alert, and oriented x 3  Thought Content:  no hallucinations, no delusions, not internally preoccupied   Suicidal Thoughts:  No denies suicidal or self injurious ideations at this time and contracts for safety on unit   Homicidal Thoughts:  No  Memory:  recent and remote grossly intact   Judgement:  Fair  Insight:  Fair  Psychomotor Activity:  Decreased- no psychomotor restlessness or agitation  Concentration:  Concentration: Fair and Attention Span: Fair  Recall:  [employer] of Knowledge:  Fair  Language:  Good  Akathisia:  No  Handed:  Right  AIMS (if indicated):     Assets:  Communication Skills Desire for Improvement Housing Resilience Social Support  ADL's:  Intact  Cognition:  WNL  Sleep:  Number of Hours: 5   Assessment:  34 year old male, presented to the emergency room on 5/13 following a suicide attempt  by overdosing on insulin (patient is diabetic, on insulin management).  He describes overdose as impulsive, unplanned and in the context of feeling depressed.  He was initially admitted to medical unit for stabilization.  He reports history of bipolar disorder diagnoses and describes episodes of depression and also brief episodes of increased energy and decreased need for sleep. He reports he has felt depressed over recent months and identifies protruding stressors as death of his mother 2 years ago, death of a pet dog 2 years ago, recent relocation from [place] several months ago after which he ran out of his prescribed psychiatric medications several weeks ago  Patient presents alert,attentive and oriented x 3. Endorses lingering depression and presents vaguely dysphoric and irritable, but behavior calm, without agitation or restlessness . Denies SI at this time and contracts for safety. Tolerating medications well . Focused on meds/ doses and states he had been on higher doses of Seroquel and of Lyrica prior to admission.  RN staff reports patient has had episodes of enuresis. I have reviewed this with Scientist, physiological . It is noted patient has a history of hydrocephalus and shunting . 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 Recommendation is to obtain clean catch UA/Ucx, repeat CBC and continue to optimize DM management.   Treatment Plan Summary: Daily contact with patient to assess and evaluate symptoms and progress in treatment and Medication management  Treatment Plan reviewed as below today 5/25  Encourage group and milieu participation Treatment team working on disposition planning options  Continue  Depakote DR 500 mg p.o. daily and 750 mg q. evening for mood disorder Continue Lexapro 10 mg p.o. daily for depression Continue NovoLog sliding scale for meal and QHS Continue  NovoLog 10 units subcu 3 times daily with meals Increase  Levemir to 40 units subcu at bedtime as recommended by DM coordinator/consultant  Continue Synthroid 100 mcg p.o. daily for hypothyroidism Continue melatonin 6 mg p.o. nightly as needed for insomnia Continue Lyrica 150 mg p.o. twice daily Continue Seroquel 200 mg p.o. nightly for mood disorder ( was increased yesterday)  Continue Trazodone 50 mg p.o. nightly as needed for insomnia Check CBC, Valproic Acid Serum Level, UA/Cx    Jenne Campus, MD 03/15/2020, 12:54 PM   Patient ID: Mark [name], male   DOB: Sep 28, 1986, 34 y.o.   MRN: [id number]

## 2020-03-15 NOTE — Progress Notes (Signed)
Recreation Therapy Notes  Animal-Assisted Activity (AAA) Program Checklist/Progress Notes Patient Eligibility Criteria Checklist & Daily Group note for Rec Tx Intervention  Date: 5.25.21 Time: 1430 Location: 300 Hall Dayroom   AAA/T Program Assumption of Risk Form signed by Patient/ or Parent Legal Guardian YES  Patient is free of allergies or sever asthma  YES   Patient reports no fear of animals  YES   Patient reports no history of cruelty to animals YES   Patient understands his/her participation is voluntary YES   Patient washes hands before animal contact  YES  Patient washes hands after animal contact  YES  Behavioral Response: Engaged  Education: Hand Washing, Appropriate Animal Interaction   Education Outcome: Acknowledges understanding/In group clarification offered/Needs additional education.   Clinical Observations/Feedback:  Pt attended and participated in group activity.    Marjette Lindsay, LRT/CTRS        Lindsay, Marjette A 03/15/2020 3:41 PM 

## 2020-03-15 NOTE — Progress Notes (Signed)
   03/15/20 0725  Vital Signs  Temp 98.1 F (36.7 C)  Temp Source Oral  Pulse Rate 99  Pulse Rate Source Monitor  Resp 20  BP 97/74  BP Location Left Arm  BP Method Automatic  Patient Position (if appropriate) Standing  Oxygen Therapy  SpO2 98 %  Patient was irritable and labile in the morning yelling at staff to shut his door. Patient too

## 2020-03-16 LAB — CBC WITH DIFFERENTIAL/PLATELET
Abs Immature Granulocytes: 0.06 10*3/uL (ref 0.00–0.07)
Basophils Absolute: 0.1 10*3/uL (ref 0.0–0.1)
Basophils Relative: 1 %
Eosinophils Absolute: 0.3 10*3/uL (ref 0.0–0.5)
Eosinophils Relative: 4 %
HCT: 40.4 % (ref 39.0–52.0)
Hemoglobin: 13 g/dL (ref 13.0–17.0)
Immature Granulocytes: 1 %
Lymphocytes Relative: 30 %
Lymphs Abs: 2.6 10*3/uL (ref 0.7–4.0)
MCH: 29.5 pg (ref 26.0–34.0)
MCHC: 32.2 g/dL (ref 30.0–36.0)
MCV: 91.6 fL (ref 80.0–100.0)
Monocytes Absolute: 0.6 10*3/uL (ref 0.1–1.0)
Monocytes Relative: 7 %
Neutro Abs: 5 10*3/uL (ref 1.7–7.7)
Neutrophils Relative %: 57 %
Platelets: 272 10*3/uL (ref 150–400)
RBC: 4.41 MIL/uL (ref 4.22–5.81)
RDW: 14.5 % (ref 11.5–15.5)
WBC: 8.6 10*3/uL (ref 4.0–10.5)
nRBC: 0 % (ref 0.0–0.2)

## 2020-03-16 LAB — VALPROIC ACID LEVEL: Valproic Acid Lvl: 57 ug/mL (ref 50.0–100.0)

## 2020-03-16 LAB — GLUCOSE, CAPILLARY
Glucose-Capillary: 238 mg/dL — ABNORMAL HIGH (ref 70–99)
Glucose-Capillary: 69 mg/dL — ABNORMAL LOW (ref 70–99)
Glucose-Capillary: 437 mg/dL — ABNORMAL HIGH (ref 70–99)
Glucose-Capillary: 384 mg/dL — ABNORMAL HIGH (ref 70–99)

## 2020-03-16 MED ORDER — ESCITALOPRAM OXALATE 20 MG PO TABS
20.0000 mg | ORAL_TABLET | Freq: Every day | ORAL | Status: DC
  Administered 2020-03-17 – 2020-03-18 (×2): 20 mg via ORAL
  Filled 2020-03-16 (×3): qty 1
  Filled 2020-03-16 (×2): qty 7

## 2020-03-16 MED ORDER — QUETIAPINE FUMARATE 300 MG PO TABS
300.0000 mg | ORAL_TABLET | Freq: Every day | ORAL | Status: DC
  Administered 2020-03-16 – 2020-03-17 (×2): 300 mg via ORAL
  Filled 2020-03-16 (×2): qty 7
  Filled 2020-03-16 (×3): qty 1

## 2020-03-16 NOTE — Progress Notes (Signed)
Adult Psychoeducational Group Note  Date:  03/16/2020 Time:  6:56 PM  Group Topic/Focus:  Goals Group:   The focus of this group is to help patients establish daily goals to achieve during treatment and discuss how the patient can incorporate goal setting into their daily lives to aide in recovery.  Participation Level:  Active  Participation Quality:  Appropriate  Affect:  Appropriate  Cognitive:  Alert  Insight: Appropriate  Engagement in Group:  Engaged  Modes of Intervention:  Discussion  Additional Comments:  Pt attended group and participated in discussion.  Jessenia Filippone R Lecretia Buczek 03/16/2020, 6:56 PM

## 2020-03-16 NOTE — Progress Notes (Signed)
Pt came up to the NS appearing to be agitated. Writer asked pt if they needed anything and they responded that they did not want the breakfast that was given to them. Writer explained to pt that they were given a diabetic tray. Pt repeated that they did not want that and writer repeated why they received that tray. Pt had laundry in their hand and angrily told Clinical research associate they needed their laundry done. Writer told Clinical research associate to sit it down and that we would wash it. Pt tossed laundry on the ground angrily. Pt went down to their room and yelled loudly and banged the wall. Pt was not visibly hurt or in distress when writer did next check.

## 2020-03-16 NOTE — Progress Notes (Signed)
[facility] MD Progress Note  03/16/2020 11:08 AM GRANVEL PROUDFOOT  MRN:  [id number]   Subjective:  Follow up with this 34 year old male diagnosed with MDD severe recurrent.  Patient reports that he is still feeling very depressed today.  He states that he is having some disrupted sleep.  He continues report having thoughts of suicide and stabbing himself in the chest when he discharges from the hospital.  He states the reason for this is that he does not have a reason to really live anymore.  He reports that he would rather be with his friends and his mother who have died over the years.  Patient denies any homicidal ideations and denies any hallucinations.  Patient request to have his Lyrica increased today.  He also reports that he has not been compliant with his treatment regimen outside of the hospital.  He states that he may have been to therapy 2-3 times in the year, he states "they tell me things that I do not want to hear so I do not go back."  He states that we can contact his roommate/coowner of his house, Sula Soda. Sula Soda is contacted.  It is found out that he is currently admitted at [facility] and was trying to get in contact with the patient to inform him that he was being discharged on Friday.  He also reports that the patient is a cold or on his home that he has been placed on the deed.  He reports that he knows that the patient has been very depressed recently and that he needs to be restarted on his medications and go to his follow-up appointments.  He states that he also has to go to appointments and that he plans on encouraging him to go to appointments when he has to go.  Principal Problem: MDD (major depressive disorder), recurrent episode, severe (HCC) Diagnosis: Principal Problem:   MDD (major depressive disorder), recurrent episode, severe (HCC)  Total Time spent with patient: 30 minutes  Past Psychiatric History: history of past psychiatric admissions, most recently  6 months ago, for depression, suicidal attempt by overdose . He reports history of several suicide attempts starting in 2008. Denies history of self cutting. Reports he has been diagnosed with Bipolar Disorder and describes brief episodes of increased energy and decreased need for sleep.  Denies history of psychosis.   Past Medical History:  Past Medical History:  Diagnosis Date  . Anxiety   . Bipolar disorder (HCC)   . Depression   . Diabetes mellitus without complication (HCC)   . Diabetic polyneuropathy associated with type 1 diabetes mellitus (HCC)   . Hydrocephalus (HCC)   . Kidney stones   . Seizures (HCC)     Past Surgical History:  Procedure Laterality Date  . KIDNEY STONE SURGERY     Family History:  Family History  Problem Relation Age of Onset  . Breast cancer Mother    Family Psychiatric  History: mother deceased, died of breast cancer 3 years ago, father lives in [place] Social History:  Social History   Substance and Sexual Activity  Alcohol Use Never   Comment: occassional      Social History   Substance and Sexual Activity  Drug Use Yes  . Frequency: 7.0 times per week  . Types: Marijuana   Comment: smoked 2-3 days ago, usually smokes daily    Social History   Socioeconomic History  . Marital status: Single    Spouse name: Not on  file  . Number of children: Not on file  . Years of education: Not on file  . Highest education level: Not on file  Occupational History  . Not on file  Tobacco Use  . Smoking status: Current Every Day Smoker    Packs/day: 1.00    Years: 20.00    Pack years: 20.00    Types: Cigarettes  . Smokeless tobacco: Never Used  Substance and Sexual Activity  . Alcohol use: Never    Comment: occassional   . Drug use: Yes    Frequency: 7.0 times per week    Types: Marijuana    Comment: smoked 2-3 days ago, usually smokes daily  . Sexual activity: Not Currently  Other Topics Concern  . Not on file  Social History  Narrative  . Not on file   Social Determinants of Health   Financial Resource Strain:   . Difficulty of Paying Living Expenses:   Food Insecurity:   . Worried About Charity fundraiser in the Last Year:   . Arboriculturist in the Last Year:   Transportation Needs:   . Film/video editor (Medical):   Marland Kitchen Lack of Transportation (Non-Medical):   Physical Activity:   . Days of Exercise per Week:   . Minutes of Exercise per Session:   Stress:   . Feeling of Stress :   Social Connections:   . Frequency of Communication with Friends and Family:   . Frequency of Social Gatherings with Friends and Family:   . Attends Religious Services:   . Active Member of Clubs or Organizations:   . Attends Archivist Meetings:   Marland Kitchen Marital Status:    Additional Social History:    Pain Medications: see MAR Prescriptions: ee MAR Over the Counter: see MAR History of alcohol / drug use?: Yes Longest period of sobriety (when/how long): unsure Negative Consequences of Use: Financial Withdrawal Symptoms: Agitation, Irritability                    Sleep: Good  Appetite:  Fair  Current Medications: Current Facility-Administered Medications  Medication Dose Route Frequency Provider Last Rate Last Admin  . acetaminophen (TYLENOL) tablet 650 mg  650 mg Oral Q6H PRN Emmaline Kluver, FNP   650 mg at 03/14/20 1956  . atorvastatin (LIPITOR) tablet 20 mg  20 mg Oral Daily Cobos, Myer Peer, MD   20 mg at 03/16/20 0829  . divalproex (DEPAKOTE) DR tablet 500 mg  500 mg Oral Daily Adeleine Pask, Lowry Ram, FNP   500 mg at 03/16/20 5621  . divalproex (DEPAKOTE) DR tablet 750 mg  750 mg Oral QPM Estha Few, Darnelle Maffucci B, FNP   750 mg at 03/15/20 1855  . [START ON 03/17/2020] escitalopram (LEXAPRO) tablet 20 mg  20 mg Oral Daily Kevon Tench, Lowry Ram, FNP      . insulin aspart (novoLOG) injection 0-5 Units  0-5 Units Subcutaneous QHS Cobos, Myer Peer, MD   2 Units at 03/14/20 2125  . insulin aspart (novoLOG) injection  0-9 Units  0-9 Units Subcutaneous TID WC Cobos, Myer Peer, MD   3 Units at 03/16/20 [phone]  . insulin aspart (novoLOG) injection 10 Units  10 Units Subcutaneous TID WC Cobos, Myer Peer, MD   10 Units at 03/16/20 [phone]  . insulin detemir (LEVEMIR) injection 40 Units  40 Units Subcutaneous QHS Cobos, Myer Peer, MD   40 Units at 03/15/20 2113  . levothyroxine (SYNTHROID) tablet 100 mcg  100  mcg Oral Q0600 Cobos, Rockey Situ, MD   100 mcg at 03/16/20 [phone]  . melatonin tablet 6 mg  6 mg Oral QHS PRN Patrcia Dolly, FNP   6 mg at 03/15/20 2113  . nicotine polacrilex (NICORETTE) gum 2 mg  2 mg Oral PRN Trevell Pariseau, Gerlene Burdock, FNP   2 mg at 03/14/20 1957  . polyethylene glycol (MIRALAX / GLYCOLAX) packet 17 g  17 g Oral Daily Patrcia Dolly, FNP      . pregabalin (LYRICA) capsule 150 mg  150 mg Oral BID Cobos, Rockey Situ, MD   150 mg at 03/16/20 0829  . QUEtiapine (SEROQUEL) tablet 300 mg  300 mg Oral QHS Braycen Burandt, Gerlene Burdock, FNP      . traZODone (DESYREL) tablet 50 mg  50 mg Oral QHS PRN Cobos, Rockey Situ, MD   50 mg at 03/15/20 2113    Lab Results:  Results for orders placed or performed during the hospital encounter of 03/13/20 (from the past 48 hour(s))  Glucose, capillary     Status: Abnormal   Collection Time: 03/14/20 12:29 PM  Result Value Ref Range   Glucose-Capillary 220 (H) 70 - 99 mg/dL    Comment: Glucose reference range applies only to samples taken after fasting for at least 8 hours.  Glucose, capillary     Status: Abnormal   Collection Time: 03/14/20  4:54 PM  Result Value Ref Range   Glucose-Capillary 249 (H) 70 - 99 mg/dL    Comment: Glucose reference range applies only to samples taken after fasting for at least 8 hours.  Glucose, capillary     Status: Abnormal   Collection Time: 03/14/20  8:45 PM  Result Value Ref Range   Glucose-Capillary 256 (H) 70 - 99 mg/dL    Comment: Glucose reference range applies only to samples taken after fasting for at least 8 hours.   Comment 1 Notify RN     Comment 2 Document in Chart   Glucose, capillary     Status: Abnormal   Collection Time: 03/15/20  6:05 AM  Result Value Ref Range   Glucose-Capillary 269 (H) 70 - 99 mg/dL    Comment: Glucose reference range applies only to samples taken after fasting for at least 8 hours.  Valproic acid level     Status: None   Collection Time: 03/15/20  6:34 AM  Result Value Ref Range   Valproic Acid Lvl 64 50.0 - 100.0 ug/mL    Comment: Performed at [facility], [address] [place], [place] [address]  Glucose, capillary     Status: Abnormal   Collection Time: 03/15/20 11:35 AM  Result Value Ref Range   Glucose-Capillary 206 (H) 70 - 99 mg/dL    Comment: Glucose reference range applies only to samples taken after fasting for at least 8 hours.  Glucose, capillary     Status: Abnormal   Collection Time: 03/15/20  4:43 PM  Result Value Ref Range   Glucose-Capillary 342 (H) 70 - 99 mg/dL    Comment: Glucose reference range applies only to samples taken after fasting for at least 8 hours.  Glucose, capillary     Status: Abnormal   Collection Time: 03/15/20  9:08 PM  Result Value Ref Range   Glucose-Capillary 191 (H) 70 - 99 mg/dL    Comment: Glucose reference range applies only to samples taken after fasting for at least 8 hours.  Glucose, capillary     Status: Abnormal   Collection  Time: 03/16/20  5:59 AM  Result Value Ref Range   Glucose-Capillary 238 (H) 70 - 99 mg/dL    Comment: Glucose reference range applies only to samples taken after fasting for at least 8 hours.    Blood Alcohol level:  Lab Results  Component Value Date   ETH <10 03/05/2020   ETH <10 01/02/2020    Metabolic Disorder Labs: Lab Results  Component Value Date   HGBA1C 8.8 (H) 02/03/2020   MPG 206 02/03/2020   MPG 194.38 12/29/2019   No results found for: PROLACTIN Lab Results  Component Value Date   CHOL 214 (H) 03/05/2020   TRIG 81 03/05/2020   HDL 45 03/05/2020   CHOLHDL 4.8 03/05/2020    VLDL 16 03/05/2020   LDLCALC 153 (H) 03/05/2020   LDLCALC 141 (H) 09/11/2017    Physical Findings: AIMS: Facial and Oral Movements Muscles of Facial Expression: None, normal Lips and Perioral Area: None, normal Jaw: None, normal Tongue: None, normal,Extremity Movements Upper (arms, wrists, hands, fingers): None, normal Lower (legs, knees, ankles, toes): None, normal, Trunk Movements Neck, shoulders, hips: None, normal, Overall Severity Severity of abnormal movements (highest score from questions above): None, normal Incapacitation due to abnormal movements: None, normal Patient's awareness of abnormal movements (rate only patient's report): No Awareness, Dental Status Current problems with teeth and/or dentures?: Yes(needs to see dentist) Does patient usually wear dentures?: No  CIWA:  CIWA-Ar Total: 6 COWS:  COWS Total Score: 3  Musculoskeletal: Strength & Muscle Tone: within normal limits Gait & Station: ataxic Patient leans: N/A  Psychiatric Specialty Exam: Physical Exam  Nursing note and vitals reviewed. Constitutional: He is oriented to person, place, and time. He appears well-developed and well-nourished.  Cardiovascular: Normal rate.  Respiratory: Effort normal.  Musculoskeletal:        General: Normal range of motion.  Neurological: He is alert and oriented to person, place, and time.  Skin: Skin is warm.    Review of Systems  Constitutional: Negative.   HENT: Negative.   Eyes: Negative.   Respiratory: Negative.   Cardiovascular: Negative.   Gastrointestinal: Negative.   Genitourinary: Negative.   Musculoskeletal: Negative.   Skin: Negative.   Neurological: Negative.   Psychiatric/Behavioral: Positive for dysphoric mood and suicidal ideas.    Blood pressure 97/74, pulse 99, temperature 98.1 F (36.7 C), temperature source Oral, resp. rate 20, SpO2 98 %.There is no height or weight on file to calculate BMI.  General Appearance: Disheveled  Eye Contact:   Good  Speech:  Clear and Coherent and Normal Rate  Volume:  Normal  Mood:  Depressed  Affect:  Depressed and Flat  Thought Process:  Coherent and Descriptions of Associations: Intact  Orientation:  Full (Time, Place, and Person)  Thought Content:  WDL  Suicidal Thoughts:  Yes.  with intent/plan  Homicidal Thoughts:  No  Memory:  Immediate;   Good Recent;   Good Remote;   Good  Judgement:  Fair  Insight:  Fair  Psychomotor Activity:  Normal  Concentration:  Concentration: Good  Recall:  Good  Fund of Knowledge:  Fair  Language:  Good  Akathisia:  No  Handed:  Right  AIMS (if indicated):     Assets:  Communication Skills Desire for Improvement Housing Social Support  ADL's:  Intact  Cognition:  WNL  Sleep:  Number of Hours: 6   Assessment: Patient presents in his room lying in the bed but is awake.  Patient is pleasant, calm, cooperative during our  evaluation.  There have been reports of the patient becoming agitated when he does not get what he wants when he specifically asked for it.  He did have complaint about his laundry being done in an appropriate time that placed him.  He is continued to ask for increased doses of Lyrica.  Patient is educated on the potential of medication such as Lyrica, Seroquel, and his diet increase in his blood sugar levels.  Patient's blood sugar levels have remained elevated the last 4 readings being 206, 342, 191, and 238.  Patient's valproic acid level came back at 64.  Patient's vital signs are WNL  Treatment Plan Summary: Daily contact with patient to assess and evaluate symptoms and progress in treatment and Medication management Continue Depakote 500 mg p.o. every morning and 750 mg p.o. q. evening for mood stability Increase Lexapro 20 mg p.o. daily for depression Continue Synthroid 100 mcg p.o. daily for hypothyroidism Increase Seroquel 300 mg p.o. nightly for mood stability and improved sleep Continue trazodone 50 mg p.o. nightly as  needed Reviewed notes from diabetic coordinator patient's Levemir has been increased to 40 units subcu daily and will continue on his NovoLog 10 units subcu 3 times daily with meals and NovoLog sliding scale meal coverage Encourage group therapy participation Continue every 15 minute safety checks  Maryfrances Bunnellravis B Mariell Nester, FNP 03/16/2020, 11:08 AM

## 2020-03-16 NOTE — Progress Notes (Signed)
   03/16/20 0900  Psych Admission Type (Psych Patients Only)  Admission Status Involuntary  Psychosocial Assessment  Patient Complaints Depression;Tension;Self-harm thoughts  Eye Contact Fair;Brief  Facial Expression Flat  Affect Depressed  Speech Logical/coherent  Interaction Assertive  Motor Activity Slow  Appearance/Hygiene Unremarkable  Behavior Characteristics Cooperative;Fidgety  Mood Depressed;Anxious  Thought Process  Coherency WDL  Content WDL  Delusions None reported or observed  Perception WDL  Hallucination None reported or observed  Judgment Limited  Confusion None  Danger to Self  Current suicidal ideation? Passive;Verbalizes  Self-Injurious Behavior Self-injurious ideation verbalized  Agreement Not to Harm Self Yes  Description of Agreement verbally contracts for safety  Danger to Others  Danger to Others None reported or observed

## 2020-03-16 NOTE — [facility] Group Notes (Signed)
Adult Psychoeducational Group Note  Date:  03/16/2020 Time:  9:52 PM  Group Topic/Focus:  Wrap-Up Group:   The focus of this group is to help patients review their daily goal of treatment and discuss progress on daily workbooks.  Participation Level:  Active  Participation Quality:  Redirectable  Affect:  Angry, Blunted and Irritable  Cognitive:  Alert and Appropriate  Insight: Lacking  Engagement in Group:  Distracting and Monopolizing  Modes of Intervention:  Clarification, Discussion, Education and Limit-setting  Additional Comments:  Pt attended and participated in wrap up group this evening and rated their day a 6/10, due to them not getting everything they needed. Pt wants to feel better and to get on the right meds. Pt states that the Dr has been trying all different meds, but they are not working.   Chrisandra Netters 03/16/2020, 9:52 PM

## 2020-03-16 NOTE — Progress Notes (Signed)
Pt continues to nocturnal enuresis. Upon entering pt's room, bed linen and pt clothing was visibly wet up to the neckline of his shirt. Pt given new linen and two gowns to change into so that his clothing can be washed.

## 2020-03-16 NOTE — Progress Notes (Signed)
Pt refused to have his CBG checked this evening. Given the pt's CBG reading at 1822 (384), pt was given the nighttime Levemir 40 U. Pt also educated on the need to limit snacks with processed and artificial sugars d/t its effects on blood sugar levels. Pt irritable and annoyed by this. Pt given one 8 oz diet Ginger Ale this evening and advised to drink more water after that. Pt told that his diet order is for carb modified foods. Pt given the choice of jello, unsweetened applesauce or pretzels to assist with blood sugar control. Will continue to monitor.

## 2020-03-16 NOTE — Progress Notes (Signed)
Patient reports that he does not have to follow a diabetic diet and that he is used to eating "anything I want, I just take the insulin, I've been doing this for a long time."  RN explained the importance of good blood glucose control and eating foods that are low in carbs and sugar.  Pt again stated that he can eat what he wants to eat.  Blood sugar at lunch time was 69.  Pt stated, I didn't want what they gave me for breakfast so I decided to eat nothing."  Then patient ate puddings and other carbohydrates at lunch and pt's sugar was 437. Insulin administered per orders and RN notified NP of pt's blood sugars. RN checked pt's blood sugar at 1830 (1 hour after pt ate dinner) and pt's blood sugar was 384.

## 2020-03-17 LAB — GLUCOSE, CAPILLARY
Glucose-Capillary: 273 mg/dL — ABNORMAL HIGH (ref 70–99)
Glucose-Capillary: 178 mg/dL — ABNORMAL HIGH (ref 70–99)
Glucose-Capillary: 456 mg/dL — ABNORMAL HIGH (ref 70–99)
Glucose-Capillary: 187 mg/dL — ABNORMAL HIGH (ref 70–99)
Glucose-Capillary: 198 mg/dL — ABNORMAL HIGH (ref 70–99)

## 2020-03-17 LAB — URINALYSIS, COMPLETE (UACMP) WITH MICROSCOPIC
Bacteria, UA: NONE SEEN
Bilirubin Urine: NEGATIVE
Glucose, UA: >=500 mg/dL — AB
Hgb urine dipstick: NEGATIVE
Ketones, ur: NEGATIVE mg/dL
Leukocytes,Ua: NEGATIVE
Nitrite: NEGATIVE
Protein, ur: NEGATIVE mg/dL
Specific Gravity, Urine: 1.017 (ref 1.005–1.030)
pH: 7 (ref 5.0–8.0)

## 2020-03-17 NOTE — Progress Notes (Signed)
Patient requested RN to take CBG at this time.  Patient's CBG was 456.  MD informed and ordered RN to give 1700 insulin novolog at this time.  Novolog 10 units and Novolog 9 units sq given per MD orders.  Patient sitting in day room watching tv at this time.  Staff to go to dayroom with patient at dinner to help him choose his dinner items.

## 2020-03-17 NOTE — [facility] Group Notes (Addendum)
LCSW Group Therapy Note 03/17/2020 8:49 AM  Type of Therapy/Topic: Group Therapy: Emotion Regulation  Participation Level: Did Not Attend   Description of Group:  The purpose of this group is to assist patients in learning to regulate negative emotions and experience positive emotions. Patients will be guided to discuss ways in which they have been vulnerable to their negative emotions. These vulnerabilities will be juxtaposed with experiences of positive emotions or situations, and patients will be challenged to use positive emotions to combat negative ones. Special emphasis will be placed on coping with negative emotions in conflict situations, and patients will process healthy conflict resolution skills.  Therapeutic Goals: 1. Patient will identify two positive emotions or experiences to reflect on in order to balance out negative emotions 2. Patient will label two or more emotions that they find the most difficult to experience 3. Patient will demonstrate positive conflict resolution skills through discussion and/or role plays  Summary of Patient Progress:     Therapeutic Modalities:  Cognitive Behavioral Therapy Feelings Identification Dialectical Behavioral Therapy   Alcario Drought Clinical Social Worker

## 2020-03-17 NOTE — Progress Notes (Signed)
   03/17/20 2100  Psych Admission Type (Psych Patients Only)  Admission Status Involuntary  Psychosocial Assessment  Patient Complaints Anxiety;Irritability  Eye Contact Brief  Facial Expression Animated;Sullen  Affect Sullen;Labile  Speech Logical/coherent  Interaction Assertive  Motor Activity Slow  Appearance/Hygiene Disheveled  Behavior Characteristics Anxious;Agitated  Mood Anxious;Irritable  Thought Process  Coherency WDL  Content WDL  Delusions None reported or observed  Perception WDL  Hallucination None reported or observed  Judgment Limited  Confusion None  Danger to Self  Current suicidal ideation? Passive  Self-Injurious Behavior No self-injurious ideation or behavior indicators observed or expressed   Agreement Not to Harm Self Yes  Description of Agreement contracts for safety verbal  Danger to Others  Danger to Others None reported or observed   Pt endorses passive SI but agrees to come to staff if he feels like harming himself. Pt given meds this evening.  Pt upset that provider discontinued Trazodone for insomnia. Pt took melatonin 6 mg but wanted more. Stated that provider did not discuss with him that he was going to remove this medication.

## 2020-03-17 NOTE — Progress Notes (Signed)
Pt at nurse's stations stating that he cannot sleep. Pt knows that he doesn't have trazodone on his medication list for tonight. Pt told that the provider removed that medication. Pt told that he will have to speak to the provider in the morning about the reason for removal.

## 2020-03-17 NOTE — Plan of Care (Signed)
Nurse discussed coping skills with patient for aggressive behavior.

## 2020-03-17 NOTE — Progress Notes (Signed)
[facility] MD Progress Note  03/17/2020 2:15 PM Mark [name]  MRN:  [id number]   Subjective: Patient reports he continues to feel depressed.  Endorses intermittent passive SI but currently does not endorse suicidal plan or intention.  Does not endorse medication side effects.  Objective: Have discussed case with treatment team and met with patient. 34 year old male, presented to the emergency room on 5/13 following a suicide attempt by overdosing on insulin (patient is diabetic, on insulin management).He describes overdose as impulsive, unplanned and in the context of feeling depressed. He was initially admitted to medical unit for stabilization.He reports history of bipolar disorder diagnoses and describes episodes of depression and also brief episodes of increased energy and decreased need for sleep. He reports he has felt depressed over recent months and identifies protruding stressors as death of his mother 2 years ago, death of a pet dog 2 years ago, recent relocation from [place] several months ago after which he ran out of his prescribed psychiatric medications several weeks ago.  Patient reports he continues to feel depressed. Endorses intermittent passive SI but at this time denies suicidal plans/intention and contracts for safety on unit. Today presents more future oriented and states that his roommate is currently in a hospital setting himself but scheduled to discharge tomorrow , so " maybe I can leave soon to go back home". He also expresses interest in applying for disability. Denies medication side effects at this time Noted by staff to pick dessert at mealtime. We reviewed importance of following diabetic diet for better diabetic/glycemic control. CBG today 273, 178. No disruptive or agitated behaviors today/ currently calm and cooperative on approach.  As per staff did have an episode of agitation/becoming verbally loud last night, responded to staff de-escalation. Patient has had  episodes of enuresis.  This has been reviewed with hospitalist and neurologist consultant.  Patient reports these episodes rarely occur at home and attributes them to environment and being on different diabetic/insulin regimen.  He does have a history of hydrocephalus with a past history of shunting.  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  He is alert, attentive, oriented x3.   Principal Problem: MDD (major depressive disorder), recurrent episode, severe ([place]) Diagnosis: Principal Problem:   MDD (major depressive disorder), recurrent episode, severe ([place])  Total Time spent with patient: 20 minutes  Past Psychiatric History: history of past psychiatric admissions, most recently 6 months ago, for depression, suicidal attempt by overdose . He reports history of several suicide attempts starting in 2008. Denies history of self cutting. Reports he has been diagnosed with Bipolar Disorder and describes brief episodes of increased energy and decreased need for sleep.  Denies history of psychosis.   Past Medical History:  Past Medical History:  Diagnosis Date  . Anxiety   . Bipolar disorder ([place])   . Depression   . Diabetes mellitus without complication ([place])   . Diabetic polyneuropathy associated with type 1 diabetes mellitus ([place])   . Hydrocephalus ([place])   . Kidney stones   . Seizures ([place])     Past Surgical History:  Procedure Laterality Date  . KIDNEY STONE SURGERY     Family History:  Family History  Problem Relation Age of Onset  . Breast cancer Mother    Family Psychiatric  History: mother deceased, died of breast cancer 3 years ago, father lives in [place] History:  Social History   Substance and  Sexual Activity  Alcohol Use Never   Comment: occassional      Social History   Substance and Sexual Activity  Drug Use Yes  . Frequency: 7.0 times per week  . Types: Marijuana   Comment: smoked 2-3 days  ago, usually smokes daily    Social History   Socioeconomic History  . Marital status: Single    Spouse name: Not on file  . Number of children: Not on file  . Years of education: Not on file  . Highest education level: Not on file  Occupational History  . Not on file  Tobacco Use  . Smoking status: Current Every Day Smoker    Packs/day: 1.00    Years: 20.00    Pack years: 20.00    Types: Cigarettes  . Smokeless tobacco: Never Used  Substance and Sexual Activity  . Alcohol use: Never    Comment: occassional   . Drug use: Yes    Frequency: 7.0 times per week    Types: Marijuana    Comment: smoked 2-3 days ago, usually smokes daily  . Sexual activity: Not Currently  Other Topics Concern  . Not on file  Social History Narrative  . Not on file   Social Determinants of Health   Financial Resource Strain:   . Difficulty of Paying Living Expenses:   Food Insecurity:   . Worried About Charity fundraiser in the Last Year:   . Arboriculturist in the Last Year:   Transportation Needs:   . Film/video editor (Medical):   Marland Kitchen Lack of Transportation (Non-Medical):   Physical Activity:   . Days of Exercise per Week:   . Minutes of Exercise per Session:   Stress:   . Feeling of Stress :   Social Connections:   . Frequency of Communication with Friends and Family:   . Frequency of Social Gatherings with Friends and Family:   . Attends Religious Services:   . Active Member of Clubs or Organizations:   . Attends Archivist Meetings:   Marland Kitchen Marital Status:    Additional Social History:    Pain Medications: see MAR Prescriptions: ee MAR Over the Counter: see MAR History of alcohol / drug use?: Yes Longest period of sobriety (when/how long): unsure Negative Consequences of Use: Financial Withdrawal Symptoms: Agitation, Irritability  Sleep: Good  Appetite:  Fair  Current Medications: Current Facility-Administered Medications  Medication Dose Route Frequency  Provider Last Rate Last Admin  . acetaminophen (TYLENOL) tablet 650 mg  650 mg Oral Q6H PRN Emmaline Kluver, FNP   650 mg at 03/14/20 1956  . atorvastatin (LIPITOR) tablet 20 mg  20 mg Oral Daily Zilphia Kozinski, Myer Peer, MD   20 mg at 03/17/20 0801  . divalproex (DEPAKOTE) DR tablet 500 mg  500 mg Oral Daily Money, Lowry Ram, FNP   500 mg at 03/17/20 0801  . divalproex (DEPAKOTE) DR tablet 750 mg  750 mg Oral QPM Money, Darnelle Maffucci B, FNP   750 mg at 03/16/20 1815  . escitalopram (LEXAPRO) tablet 20 mg  20 mg Oral Daily Money, Lowry Ram, FNP   20 mg at 03/17/20 0801  . insulin aspart (novoLOG) injection 0-5 Units  0-5 Units Subcutaneous QHS Easton Sivertson, Myer Peer, MD   2 Units at 03/14/20 2125  . insulin aspart (novoLOG) injection 0-9 Units  0-9 Units Subcutaneous TID WC Ajani Schnieders, Myer Peer, MD   5 Units at 03/17/20 0630  . insulin aspart (novoLOG) injection 10  Units  10 Units Subcutaneous TID WC Mitra Duling, Myer Peer, MD   10 Units at 03/17/20 [phone]  . insulin detemir (LEVEMIR) injection 40 Units  40 Units Subcutaneous QHS Tekoa Hamor, Myer Peer, MD   40 Units at 03/16/20 2121  . levothyroxine (SYNTHROID) tablet 100 mcg  100 mcg Oral Q0600 Keidra Withers, Myer Peer, MD   100 mcg at 03/17/20 1191  . melatonin tablet 6 mg  6 mg Oral QHS PRN Emmaline Kluver, FNP   6 mg at 03/16/20 2122  . nicotine polacrilex (NICORETTE) gum 2 mg  2 mg Oral PRN Money, Lowry Ram, FNP   2 mg at 03/14/20 1957  . polyethylene glycol (MIRALAX / GLYCOLAX) packet 17 g  17 g Oral Daily Emmaline Kluver, FNP      . pregabalin (LYRICA) capsule 150 mg  150 mg Oral BID Kemond Amorin, Myer Peer, MD   150 mg at 03/17/20 0802  . QUEtiapine (SEROQUEL) tablet 300 mg  300 mg Oral QHS Money, Lowry Ram, FNP   300 mg at 03/16/20 2122  . traZODone (DESYREL) tablet 50 mg  50 mg Oral QHS PRN Mattthew Ziomek, Myer Peer, MD   50 mg at 03/16/20 2122    Lab Results:  Results for orders placed or performed during the hospital encounter of 03/13/20 (from the past 48 hour(s))  Glucose, capillary     Status:  Abnormal   Collection Time: 03/15/20  4:43 PM  Result Value Ref Range   Glucose-Capillary 342 (H) 70 - 99 mg/dL    Comment: Glucose reference range applies only to samples taken after fasting for at least 8 hours.  Glucose, capillary     Status: Abnormal   Collection Time: 03/15/20  9:08 PM  Result Value Ref Range   Glucose-Capillary 191 (H) 70 - 99 mg/dL    Comment: Glucose reference range applies only to samples taken after fasting for at least 8 hours.  Glucose, capillary     Status: Abnormal   Collection Time: 03/16/20  5:59 AM  Result Value Ref Range   Glucose-Capillary 238 (H) 70 - 99 mg/dL    Comment: Glucose reference range applies only to samples taken after fasting for at least 8 hours.  Glucose, capillary     Status: Abnormal   Collection Time: 03/16/20 11:30 AM  Result Value Ref Range   Glucose-Capillary 69 (L) 70 - 99 mg/dL    Comment: Glucose reference range applies only to samples taken after fasting for at least 8 hours.  Glucose, capillary     Status: Abnormal   Collection Time: 03/16/20  4:25 PM  Result Value Ref Range   Glucose-Capillary 437 (H) 70 - 99 mg/dL    Comment: Glucose reference range applies only to samples taken after fasting for at least 8 hours.  Valproic acid level     Status: None   Collection Time: 03/16/20  6:10 PM  Result Value Ref Range   Valproic Acid Lvl 57 50.0 - 100.0 ug/mL    Comment: Performed at [facility], [place] [employer] [address] [place], [place] [address]  CBC with Differential/Platelet     Status: None   Collection Time: 03/16/20  6:10 PM  Result Value Ref Range   WBC 8.6 4.0 - 10.5 K/uL   RBC 4.41 4.22 - 5.81 MIL/uL   Hemoglobin 13.0 13.0 - 17.0 g/dL   HCT 40.4 39.0 - 52.0 %   MCV 91.6 80.0 - 100.0 fL   MCH 29.5 26.0 - 34.0 pg  MCHC 32.2 30.0 - 36.0 g/dL   RDW 14.5 11.5 - 15.5 %   Platelets 272 150 - 400 K/uL   nRBC 0.0 0.0 - 0.2 %   Neutrophils Relative % 57 %   Neutro Abs 5.0 1.7 - 7.7 K/uL   Lymphocytes  Relative 30 %   Lymphs Abs 2.6 0.7 - 4.0 K/uL   Monocytes Relative 7 %   Monocytes Absolute 0.6 0.1 - 1.0 K/uL   Eosinophils Relative 4 %   Eosinophils Absolute 0.3 0.0 - 0.5 K/uL   Basophils Relative 1 %   Basophils Absolute 0.1 0.0 - 0.1 K/uL   Immature Granulocytes 1 %   Abs Immature Granulocytes 0.06 0.00 - 0.07 K/uL    Comment: Performed at [facility], [place] [address], [place], [place] [address]  Glucose, capillary     Status: Abnormal   Collection Time: 03/16/20  6:22 PM  Result Value Ref Range   Glucose-Capillary 384 (H) 70 - 99 mg/dL    Comment: Glucose reference range applies only to samples taken after fasting for at least 8 hours.  Glucose, capillary     Status: Abnormal   Collection Time: 03/17/20  6:25 AM  Result Value Ref Range   Glucose-Capillary 273 (H) 70 - 99 mg/dL    Comment: Glucose reference range applies only to samples taken after fasting for at least 8 hours.  Urinalysis, Complete w Microscopic     Status: Abnormal   Collection Time: 03/17/20  6:45 AM  Result Value Ref Range   Color, Urine STRAW (A) YELLOW   APPearance CLEAR CLEAR   Specific Gravity, Urine 1.017 1.005 - 1.030   pH 7.0 5.0 - 8.0   Glucose, UA >=500 (A) NEGATIVE mg/dL   Hgb urine dipstick NEGATIVE NEGATIVE   Bilirubin Urine NEGATIVE NEGATIVE   Ketones, ur NEGATIVE NEGATIVE mg/dL   Protein, ur NEGATIVE NEGATIVE mg/dL   Nitrite NEGATIVE NEGATIVE   Leukocytes,Ua NEGATIVE NEGATIVE   RBC / HPF 0-5 0 - 5 RBC/hpf   Bacteria, UA NONE SEEN NONE SEEN    Comment: Performed at [facility] [address], [place], [place] [address]  Glucose, capillary     Status: Abnormal   Collection Time: 03/17/20 11:55 AM  Result Value Ref Range   Glucose-Capillary 178 (H) 70 - 99 mg/dL    Comment: Glucose reference range applies only to samples taken after fasting for at least 8 hours.    Blood Alcohol level:  Lab Results  Component Value Date   ETH <10 03/05/2020    ETH <10 76/19/5093    Metabolic Disorder Labs: Lab Results  Component Value Date   HGBA1C 8.8 (H) 02/03/2020   MPG 206 02/03/2020   MPG 194.38 12/29/2019   No results found for: PROLACTIN Lab Results  Component Value Date   CHOL 214 (H) 03/05/2020   TRIG 81 03/05/2020   HDL 45 03/05/2020   CHOLHDL 4.8 03/05/2020   VLDL 16 03/05/2020   LDLCALC 153 (H) 03/05/2020   LDLCALC 141 (H) 09/11/2017    Physical Findings: AIMS: Facial and Oral Movements Muscles of Facial Expression: None, normal Lips and Perioral Area: None, normal Jaw: None, normal Tongue: None, normal,Extremity Movements Upper (arms, wrists, hands, fingers): None, normal Lower (legs, knees, ankles, toes): None, normal, Trunk Movements Neck, shoulders, hips: None, normal, Overall Severity Severity of abnormal movements (highest score from questions above): None, normal Incapacitation due to abnormal movements: None, normal Patient's awareness of abnormal movements (rate  only patient's report): No Awareness, Dental Status Current problems with teeth and/or dentures?: Yes(needs to see dentist) Does patient usually wear dentures?: No  CIWA:  CIWA-Ar Total: 6 COWS:  COWS Total Score: 3  Musculoskeletal: Strength & Muscle Tone: within normal limits Gait & Station: ataxic Patient leans: N/A  Psychiatric Specialty Exam: Physical Exam  Nursing note and vitals reviewed. Constitutional: He is oriented to person, place, and time. He appears well-developed and well-nourished.  Cardiovascular: Normal rate.  Respiratory: Effort normal.  Musculoskeletal:        General: Normal range of motion.  Neurological: He is alert and oriented to person, place, and time.  Skin: Skin is warm.    Review of Systems  Constitutional: Negative.   HENT: Negative.   Eyes: Negative.   Respiratory: Negative.   Cardiovascular: Negative.   Gastrointestinal: Negative.   Genitourinary: Negative.   Musculoskeletal: Negative.   Skin:  Negative.   Neurological: Negative.   Psychiatric/Behavioral: Positive for dysphoric mood and suicidal ideas.  No chest pain, no shortness of breath, nausea, no vomiting  Blood pressure 97/74, pulse 99, temperature 98.1 F (36.7 C), temperature source Oral, resp. rate 20, SpO2 98 %.There is no height or weight on file to calculate BMI.  General Appearance: Fairly Groomed  Eye Contact:  Good  Speech:  Normal Rate  Volume:  Normal  Mood:  Reports feeling depressed  Affect:  Constricted and vaguely irritable  Thought Process:  Linear and Descriptions of Associations: Intact  Orientation:  Full (Time, Place, and Person)  Thought Content:  No hallucinations, no delusions  Suicidal Thoughts:  Yes.  without intent/plan at this time denies suicidal plan or intention, contracts for safety on unit  Homicidal Thoughts:  No  Memory:  Recent and remote fair  Judgement:  Fair  Insight:  Fair  Psychomotor Activity:  Normal no psychomotor agitation or restlessness  Concentration:  Concentration: Good  Recall:  Good  Fund of Knowledge:  Fair  Language:  Good  Akathisia:  No  Handed:  Right  AIMS (if indicated):     Assets:  Communication Skills Desire for Improvement Housing Social Support  ADL's:  Intact  Cognition:  WNL  Sleep:  Number of Hours: 5.5   Assessment:  34 year old male, presented to the emergency room on 5/13 following a suicide attempt by overdosing on insulin (patient is diabetic, on insulin management).He describes overdose as impulsive, unplanned and in the context of feeling depressed. He was initially admitted to medical unit for stabilization.He reports history of bipolar disorder diagnoses and describes episodes of depression and also brief episodes of increased energy and decreased need for sleep. He reports he has felt depressed over recent months and identifies protruding stressors as death of his mother 2 years ago, death of a pet dog 2 years ago, recent relocation  from [place] several months ago after which he ran out of his prescribed psychiatric medications several weeks ago.  Patient remains depressed, dysphoric and today presents vaguely irritable although behavior is calm and in good control at this time.  He endorses intermittent passive SI today , but is presenting more future oriented and states his roommate is returning home tomorrow and that he would like to be discharged soon.  He also expresses interest in applying for disability. 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  He has had episodes of enuresis which are attributed to poorly controlled diabetes rather  than to neurological condition (history of hydrocephalus).  From neurological perspective he has remained fully alert, attentive and oriented x3.  I have reviewed with both hospitalist and neurology consultant-recommendation is for patient to follow-up with outpatient neurology. Thus far tolerating medications well. Treatment Plan Summary: Daily contact with patient to assess and evaluate symptoms and progress in treatment and Medication management  Treatment plan reviewed as below today 5/27 Continue Depakote 500 mg p.o. every morning and 750 mg p.o. q. evening for mood stability Continue Lexapro 20 mg p.o. daily for depression Continue Synthroid 100 mcg p.o. daily for hypothyroidism Continue Seroquel 300 mg p.o. nightly for mood stability and improved sleep Continue Lyrica 150 mgrs BID for neuropathy,pain D/C Trazodone  Continue Insulin Detemir  40 units [place] daily, Novolog 10 units with meals , Novolog sliding scale for DM Encourage group therapy participation Continue every 15 minute safety checks Check Valproic Acid Serum level in AM  Jenne Campus, MD 03/17/2020, 2:15 PM  Patient ID: Mark [name], male   DOB: [birth year], 34 y.o.   MRN: [id number]

## 2020-03-17 NOTE — Progress Notes (Signed)
   03/16/20 2115  Psych Admission Type (Psych Patients Only)  Admission Status Involuntary  Psychosocial Assessment  Patient Complaints Irritability  Eye Contact Brief  Facial Expression Animated;Sullen  Affect Sullen;Labile  Speech Logical/coherent  Interaction Assertive  Motor Activity Slow  Appearance/Hygiene Disheveled  Behavior Characteristics Irritable  Mood Labile  Thought Process  Coherency WDL  Content WDL  Delusions None reported or observed  Perception WDL  Hallucination None reported or observed  Judgment Limited  Confusion None  Danger to Self  Current suicidal ideation? Passive  Danger to Others  Danger to Others None reported or observed

## 2020-03-17 NOTE — Progress Notes (Signed)
[facility] Group Notes:  (Nursing/MHT/Case Management/Adjunct)  Date:  03/17/2020  Time:  2045 Type of Therapy: wrap up group  Participation Level:  Active  Participation Quality:  Appropriate, Attentive, Sharing and Supportive  Affect:  Appropriate  Cognitive:  Lacking  Insight:  Improving  Engagement in Group:  Engaged  Modes of Intervention:  Clarification, Education and Support  Summary of Progress/Problems: Positive thinking and positive change were discussed.  Pt interested in a Friday vs. Saturday discharge if possible.   Marcille Buffy 03/17/2020, 9:20 PM

## 2020-03-17 NOTE — Progress Notes (Signed)
Patient's snacks were removed from patient's room by RN.  Patient had approximately 20 plus snacks in his room.  Patient informed that staff will help him choose his food items for dinner.  Patient upset and said he did not care about his blood sugar.    MD informed of patient's behavior.  RN explained to patient that he may be discharged tomorrow or the next day if he has a good day.  That we are concerned about his health so that he can be discharged.  Patient stated he did not care and that he wants to go.  Patient's self inventory sheet, patient has fair sleep, sleep medication helpful.  Fair appetite, normal energy level, good concentration.  Rated depression, hopeless and anxiety 8.  Denied withdrawals, then checked irritability, cravings.  SI, contracts for safety at [facility].  Physical problems, pain, worst pain #8 in past 24 hours, legs.  Goal is "my anger".  Plans to go to group.  No discharge plans.

## 2020-03-17 NOTE — Progress Notes (Signed)
After dinner, CBG was 187.  MD said NOT to give patient any more insulin at this time.  CBG to be done against at approximately 9:00 p.m. and insulin given if needed.

## 2020-03-17 NOTE — Progress Notes (Signed)
Pt consented to CBG to be collected this morning. CBG = 273. Pt allowed to go down to breakfast. Pt threw tantrum because he was not allowed to sit in dayroom to eat. Pt has not been allowed to eat breakfast in there. Pt dropped tray of food loudly on counter and cursed out staff. Pt now sitting in alcove eating.

## 2020-03-17 NOTE — Progress Notes (Signed)
Pt yelling and screaming for help. When staff got to the room, pt asking for urinal. Pt again screaming out and when staff entered the room, pt asking for help "finding the hole in the blanket." When asked to clarify, pt began cursing the staff. "Shut the light off and get the fuck out of my room!" Pt told he would need to get up to use the bathroom and empty his urinal.

## 2020-03-18 DIAGNOSIS — F3175 Bipolar disorder, in partial remission, most recent episode depressed: Secondary | ICD-10-CM

## 2020-03-18 LAB — URINE CULTURE: Culture: <10000 — AB

## 2020-03-18 LAB — GLUCOSE, CAPILLARY
Glucose-Capillary: 118 mg/dL — ABNORMAL HIGH (ref 70–99)
Glucose-Capillary: 198 mg/dL — ABNORMAL HIGH (ref 70–99)

## 2020-03-18 LAB — VALPROIC ACID LEVEL: Valproic Acid Lvl: 67 ug/mL (ref 50.0–100.0)

## 2020-03-18 MED ORDER — POLYETHYLENE GLYCOL 3350 17 G PO PACK: 17.0000 g | PACK | Freq: Every day | ORAL | 0 refills | Status: DC

## 2020-03-18 MED ORDER — DIVALPROEX SODIUM 250 MG PO DR TAB: 750.0000 mg | DELAYED_RELEASE_TABLET | Freq: Every evening | ORAL | 1 refills | Status: DC

## 2020-03-18 MED ORDER — DIVALPROEX SODIUM 500 MG PO DR TAB: 500.0000 mg | DELAYED_RELEASE_TABLET | Freq: Every day | ORAL | 0 refills | Status: DC

## 2020-03-18 MED ORDER — MELATONIN 3 MG PO TABS: 6.0000 mg | ORAL_TABLET | Freq: Every evening | ORAL | 1 refills | Status: DC | PRN

## 2020-03-18 MED ORDER — NICOTINE POLACRILEX 2 MG MT GUM: 2.0000 mg | CHEWING_GUM | OROMUCOSAL | 0 refills | Status: DC | PRN

## 2020-03-18 MED ORDER — ESCITALOPRAM OXALATE 20 MG PO TABS: 20.0000 mg | ORAL_TABLET | Freq: Every day | ORAL | 0 refills | Status: DC

## 2020-03-18 MED ORDER — QUETIAPINE FUMARATE 300 MG PO TABS: 300.0000 mg | ORAL_TABLET | Freq: Every day | ORAL | 0 refills | Status: DC

## 2020-03-18 MED ORDER — INSULIN DETEMIR 100 UNIT/ML ~~LOC~~ SOLN: 40.0000 [IU] | Freq: Every day | SUBCUTANEOUS | 0 refills | Status: DC

## 2020-03-18 NOTE — Progress Notes (Signed)
  [facility] Adult Case Management Discharge Plan :  Will you be returning to the same living situation after discharge:  Yes,  patient is returning to his home with his roommate At discharge, do you have transportation home?: Yes,  CSW will arrange Safe Transport Do you have the ability to pay for your medications: No.  Release of information consent forms completed and in the chart;  Patient's signature needed at discharge.  Patient to Follow up at: Follow-up Information    [facility] Follow up on 03/25/2020.   Why: You have an appointment on 03/25/20 at 8:30 am.  This will be a virtual tele-health appointment. Please be sure to provide any discharge paperwork from this hospitalization.   Contact information: [address]  [address] [place] [address] [phone]        [facility]. Go to.   Why: Please go to agency as a walk-in for medical follow up services. Please be sure to provide any discharge paperwork from this hospitalization.  Contact information: [address] [place] [phone] [phone]          Next level of care provider has access to [facility] Link:yes  Safety Planning and Suicide Prevention discussed: Yes,  with the patient  Have you used any form of tobacco in the last 30 days? (Cigarettes, Smokeless Tobacco, Cigars, and/or Pipes): Yes  Has patient been referred to the Quitline?: Patient refused referral  Patient has been referred for addiction treatment: N/A  Maeola Sarah, LCSWA 03/18/2020, 10:16 AM

## 2020-03-18 NOTE — [facility] Suicide Risk Assessment (Addendum)
[facility] Discharge Suicide Risk Assessment   Principal Problem: MDD (major depressive disorder), recurrent episode, severe (HCC) Discharge Diagnoses: Principal Problem:   MDD (major depressive disorder), recurrent episode, severe (HCC)   Total Time spent with patient: 30 minutes  Musculoskeletal: Strength & Muscle Tone: within normal limits Gait & Station: Ataxia Patient leans: N/A  Psychiatric Specialty Exam: Review of Systems no headache, no chest pain, no shortness of breath, no vomiting, no fever, no chills, no rash  Blood pressure 119/76, pulse 94, temperature 97.6 F (36.4 C), temperature source Oral, resp. rate 18, SpO2 99 %.There is no height or weight on file to calculate BMI.  General Appearance: Improving grooming  Eye Contact::  Good  Speech:  Normal Rate409  Volume:  Normal  Mood:  Reports his mood is improved and states "I am feeling better"  Affect:  More reactive, smiles at times appropriately during session  Thought Process:  Linear and Descriptions of Associations: Intact  Orientation:  Other:  Alert, attentive, oriented to month, year and hospital  Thought Content:  Denies hallucinations, no delusions expressed, does not appear internally preoccupied  Suicidal Thoughts:  No at this time denies suicidal ideations  Homicidal Thoughts:  No denies any homicidal or violent ideations  Memory:  Recent and remote fair  Judgement:  Other:  Improving  Insight:  Fair/improving  Psychomotor Activity:  Normal-no psychomotor agitation or restlessness  Concentration:  Good  Recall:  Good  Fund of Knowledge:Good  Language: Good  Akathisia:  Negative  Handed:  Right  AIMS (if indicated):     Assets:  Desire for Improvement Resilience  Sleep:  Number of Hours: 5.5  Cognition: WNL  ADL's:  Intact   Mental Status Per Nursing Assessment::   On Admission:  Suicidal ideation indicated by patient, Intention to act on suicide plan, Self-harm behaviors, Self-harm  thoughts  Demographic Factors:  34 year old male, lives with a roommate  Loss Factors: Limited support network, mother passed away 3 years ago, [place].  Historical Factors: History of prior psychiatric admissions, past history of suicide attempts in 2008.  Prior diagnoses of bipolar disorder.  Risk Reduction Factors:   Living with another person, especially a relative and Positive coping skills or problem solving skills  Continued Clinical Symptoms:  Today patient presents alert, attentive, calm without acute distress or discomfort, describes mood as improving/better, affect does appear more reactive.  No thought disorder noted.  At this time denies suicidal ideations.  No homicidal or violent ideations.  No psychotic symptoms, denies hallucinations and no delusions are expressed. Patient reports a history of chronic/intermittent passive SI which she states have been present for years.  Currently states "I do not want to die" and denies current suicidal ideations, presents future oriented, reporting he is looking forward to returning home and seeing his roommate.  He states he feels ready for discharge as his roommate/housemate is now back home and as he is now back on his psychiatric medications.  States "my goal was to get back on the medications that help me". With regards to diabetes management, he reports he has been managing his diabetes for years with insulin 70/30 which he was taking at 30 units twice daily.  He intends to continue management with insulin 70/30 and reports he has both an of insulin and syringes at home/does not need a prescription. I have reviewed this with Diabetes Care Coordinator/consultant , who have been following patient during this admission.  She agrees that it  is appropriate for patient to return to his preadmission insulin management with 70/30 at above dose which generally corresponds to the detemir dose he is receiving in  hospital. She recommends that he resume insulin tonight.  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  He denies falling.  He does not currently have an outpatient neurologist.  We have reviewed importance of regular outpatient follow ups and management with both psychiatry and PCP, who may also refer him for outpatient neurology for monitoring of hydrocephalus history Tolerating Lexapro/Depakote/Seroquel well.  We have reviewed side effect profile.  He is aware of potential for Seroquel to cause sedation, weight gain/metabolic disturbances, but states that this medication has been helpful and well tolerated . Denies having side effects or sedation.  Valproic Acid Serum level within therapeutic at 67  Cognitive Features That Contribute To Risk:  No gross cognitive deficits noted upon discharge. Is alert , attentive, and oriented x 3   Suicide Risk:  Mild:  Suicidal ideation of limited frequency, intensity, duration, and specificity.  There are no identifiable plans, no associated intent, mild dysphoria and related symptoms, good self-control (both objective and subjective assessment), few other risk factors, and identifiable protective factors, including available and accessible social support.  Follow-up Information    [facility] Follow up on 03/25/2020.   Why: You have an appointment on 03/25/20 at 8:30 am.  This will be a virtual tele-health appointment. Please be sure to provide any discharge paperwork from this hospitalization.   Contact information: [address]  [address] [place] [address] [place]. Go to.   Why: Please go to agency as a walk-in for medical follow up services. Please be sure to provide any discharge paperwork from this hospitalization.  Contact information: [place] [id number] [phone]          Plan Of Care/Follow-up recommendations:  Activity:  as tolerated  Diet:  diabetic diet  Tests:  NA Other:  See below Today patient is expressing readiness for discharge.  He is denying suicidal ideations and presents future oriented.  He plans to return home.  He is referred to [facility] for outpatient psychiatric services and to the [facility] for ongoing medical care. *As above patient plans to return to his preadmission diabetic management with insulin 70/30.  He states he does not need insulin prescription as he has enough at home. Jenne Campus, MD 03/18/2020, 10:56 AM

## 2020-03-18 NOTE — Tx Team (Signed)
Interdisciplinary Treatment and Diagnostic Plan Update  03/18/2020 Time of Session: 9:10am Mark [name] MRN: [id number]  Principal Diagnosis: MDD (major depressive disorder), recurrent episode, severe ([place])  Secondary Diagnoses: Principal Problem:   MDD (major depressive disorder), recurrent episode, severe ([place])   Current Medications:  Current Facility-Administered Medications  Medication Dose Route Frequency Provider Last Rate Last Admin  . acetaminophen (TYLENOL) tablet 650 mg  650 mg Oral Q6H PRN Emmaline Kluver, FNP   650 mg at 03/17/20 2054  . atorvastatin (LIPITOR) tablet 20 mg  20 mg Oral Daily Cobos, Myer Peer, MD   20 mg at 03/18/20 0813  . divalproex (DEPAKOTE) DR tablet 500 mg  500 mg Oral Daily Money, Lowry Ram, FNP   500 mg at 03/18/20 0815  . divalproex (DEPAKOTE) DR tablet 750 mg  750 mg Oral QPM Money, Darnelle Maffucci B, FNP   750 mg at 03/17/20 1734  . escitalopram (LEXAPRO) tablet 20 mg  20 mg Oral Daily Money, Lowry Ram, FNP   20 mg at 03/18/20 0813  . insulin aspart (novoLOG) injection 0-5 Units  0-5 Units Subcutaneous QHS Cobos, Myer Peer, MD   2 Units at 03/14/20 2125  . insulin aspart (novoLOG) injection 0-9 Units  0-9 Units Subcutaneous TID WC Cobos, Myer Peer, MD   9 Units at 03/17/20 1551  . insulin aspart (novoLOG) injection 10 Units  10 Units Subcutaneous TID WC Cobos, Myer Peer, MD   10 Units at 03/18/20 [phone]  . insulin detemir (LEVEMIR) injection 40 Units  40 Units Subcutaneous QHS Cobos, Myer Peer, MD   40 Units at 03/17/20 2056  . levothyroxine (SYNTHROID) tablet 100 mcg  100 mcg Oral Q0600 Cobos, Myer Peer, MD   100 mcg at 03/18/20 0630  . melatonin tablet 6 mg  6 mg Oral QHS PRN Emmaline Kluver, FNP   6 mg at 03/17/20 2057  . nicotine polacrilex (NICORETTE) gum 2 mg  2 mg Oral PRN Money, Lowry Ram, FNP   2 mg at 03/18/20 0817  . polyethylene glycol (MIRALAX / GLYCOLAX) packet 17 g  17 g Oral Daily Emmaline Kluver, FNP      . pregabalin (LYRICA) capsule 150 mg  150  mg Oral BID Cobos, Myer Peer, MD   150 mg at 03/18/20 0815  . QUEtiapine (SEROQUEL) tablet 300 mg  300 mg Oral QHS Money, Lowry Ram, FNP   300 mg at 03/17/20 2057   PTA Medications: Medications Prior to Admission  Medication Sig Dispense Refill Last Dose  . atorvastatin (LIPITOR) 20 MG tablet Take 1 tablet (20 mg total) by mouth daily.     . divalproex (DEPAKOTE) 500 MG DR tablet Take 1 tablet (500 mg total) by mouth every 12 (twelve) hours. 60 tablet 0   . escitalopram (LEXAPRO) 10 MG tablet Take 1 tablet (10 mg total) by mouth daily. 30 tablet 0   . insulin aspart (NOVOLOG) 100 UNIT/ML injection Inject 0-9 Units into the skin 4 (four) times daily -  before meals and at bedtime. 10 mL 11   . insulin aspart (NOVOLOG) 100 UNIT/ML injection Inject 10 Units into the skin 3 (three) times daily with meals. 10 mL 0   . insulin detemir (LEVEMIR) 100 UNIT/ML injection Inject 0.35 mLs (35 Units total) into the skin daily. 10 mL 0   . levothyroxine (SYNTHROID) 100 MCG tablet Take 1 tablet (100 mcg total) by mouth daily before breakfast. 30 tablet 0   . pregabalin (LYRICA) 150 MG capsule Take  1 capsule (150 mg total) by mouth 2 (two) times daily. 60 capsule 0   . QUEtiapine (SEROQUEL) 100 MG tablet Take 1 tablet (100 mg total) by mouth at bedtime. 30 tablet 0   . traZODone (DESYREL) 50 MG tablet Take 1 tablet (50 mg total) by mouth at bedtime. 30 tablet 0   . [DISCONTINUED] melatonin 3 MG TABS tablet Take 2 tablets (6 mg total) by mouth at bedtime as needed (sleep).  0     Patient Stressors: Health problems Substance abuse  Patient Strengths: Manufacturing systems engineer Supportive family/friends Work skills  Treatment Modalities: Medication Management, Group therapy, Case management,  1 to 1 session with clinician, Psychoeducation, Recreational therapy.   Physician Treatment Plan for Primary Diagnosis: MDD (major depressive disorder), recurrent episode, severe (HCC) Long Term Goal(s): Improvement in  symptoms so as ready for discharge Improvement in symptoms so as ready for discharge   Short Term Goals: Ability to identify changes in lifestyle to reduce recurrence of condition will improve Ability to verbalize feelings will improve Ability to disclose and discuss suicidal ideas Ability to demonstrate self-control will improve Ability to identify and develop effective coping behaviors will improve Ability to maintain clinical measurements within normal limits will improve Ability to identify changes in lifestyle to reduce recurrence of condition will improve Ability to verbalize feelings will improve Ability to disclose and discuss suicidal ideas Ability to demonstrate self-control will improve Ability to identify and develop effective coping behaviors will improve Ability to maintain clinical measurements within normal limits will improve Compliance with prescribed medications will improve  Medication Management: Evaluate patient's response, side effects, and tolerance of medication regimen.  Therapeutic Interventions: 1 to 1 sessions, Unit Group sessions and Medication administration.  Evaluation of Outcomes: Adequate for Discharge  Physician Treatment Plan for Secondary Diagnosis: Principal Problem:   MDD (major depressive disorder), recurrent episode, severe (HCC)  Long Term Goal(s): Improvement in symptoms so as ready for discharge Improvement in symptoms so as ready for discharge   Short Term Goals: Ability to identify changes in lifestyle to reduce recurrence of condition will improve Ability to verbalize feelings will improve Ability to disclose and discuss suicidal ideas Ability to demonstrate self-control will improve Ability to identify and develop effective coping behaviors will improve Ability to maintain clinical measurements within normal limits will improve Ability to identify changes in lifestyle to reduce recurrence of condition will improve Ability to  verbalize feelings will improve Ability to disclose and discuss suicidal ideas Ability to demonstrate self-control will improve Ability to identify and develop effective coping behaviors will improve Ability to maintain clinical measurements within normal limits will improve Compliance with prescribed medications will improve     Medication Management: Evaluate patient's response, side effects, and tolerance of medication regimen.  Therapeutic Interventions: 1 to 1 sessions, Unit Group sessions and Medication administration.  Evaluation of Outcomes: Adequate for Discharge   RN Treatment Plan for Primary Diagnosis: MDD (major depressive disorder), recurrent episode, severe (HCC) Long Term Goal(s): Knowledge of disease and therapeutic regimen to maintain health will improve  Short Term Goals: Ability to remain free from injury will improve, Ability to disclose and discuss suicidal ideas, Ability to identify and develop effective coping behaviors will improve and Compliance with prescribed medications will improve  Medication Management: RN will administer medications as ordered by provider, will assess and evaluate patient's response and provide education to patient for prescribed medication. RN will report any adverse and/or side effects to prescribing provider.  Therapeutic Interventions: 1 on  1 counseling sessions, Psychoeducation, Medication administration, Evaluate responses to treatment, Monitor vital signs and CBGs as ordered, Perform/monitor CIWA, COWS, AIMS and Fall Risk screenings as ordered, Perform wound care treatments as ordered.  Evaluation of Outcomes: Adequate for Discharge   LCSW Treatment Plan for Primary Diagnosis: MDD (major depressive disorder), recurrent episode, severe (HCC) Long Term Goal(s): Safe transition to appropriate next level of care at discharge, Engage patient in therapeutic group addressing interpersonal concerns.  Short Term Goals: Engage patient in  aftercare planning with referrals and resources, Increase social support, Identify triggers associated with mental health/substance abuse issues and Increase skills for wellness and recovery  Therapeutic Interventions: Assess for all discharge needs, 1 to 1 time with Social worker, Explore available resources and support systems, Assess for adequacy in community support network, Educate family and significant other(s) on suicide prevention, Complete Psychosocial Assessment, Interpersonal group therapy.  Evaluation of Outcomes: Adequate for Discharge   Progress in Treatment: Attending groups: Yes. Participating in groups: Yes. Taking medication as prescribed: Yes. Toleration medication: Yes. Family/Significant other contact made: No, will contact:  declined consents. SPE reviewed with patient Patient understands diagnosis: Yes. Discussing patient identified problems/goals with staff: Yes. Medical problems stabilized or resolved: No. Denies suicidal/homicidal ideation: Yes. Issues/concerns per patient self-inventory: No. Other: none  New problem(s) identified: No, Describe:  none noted  New Short Term/Long Term Goal(s): detox, medication management for mood stabilization; elimination of SI thoughts; development of comprehensive mental wellness/sobriety plan  Patient Goals:  "To get back on all my medicine"  Discharge Plan or Barriers: [facility]. Declined medical follow up appointments.   Reason for Continuation of Hospitalization: Depression Medication stabilization  Estimated Length of Stay: discharging today  Attendees: Patient: Mark [name] 03/14/2020   Physician:  03/14/2020   Nursing:  03/14/2020  RN Care Manager: 03/14/2020   Social Worker: Ruthann Cancer, Theresia Majors 03/14/2020   Recreational Therapist:  03/14/2020   Other: Reola Calkins, FNP 03/14/2020   Other:  03/14/2020   Other: 03/14/2020     Scribe for Treatment Team: Darreld Mclean, Rema Fendt 03/18/2020 9:39 AM

## 2020-03-18 NOTE — Progress Notes (Signed)
Recreation Therapy Notes  Date:  5.28.21 Time: 0930 Location: [address] Group Room  Group Topic: Stress Management  Goal Area(s) Addresses:  Patient will identify positive stress management techniques. Patient will identify benefits of using stress management post d/c.  Behavioral Response:  Engaged  Intervention: Stress Management  Activity: Guided Imagery.  LRT read a script that lead patients on a journey being outside at night observing the stars in the sky.  Patients were to listen and follow along as script was read to engage in activity.   Education:  Stress Management, Discharge Planning.   Education Outcome: Acknowledges Education  Clinical Observations/Feedback: Pt attended and participated in activity.     Elanna Bert, LRT/CTRS         Cherae Marton A 03/18/2020 12:04 PM 

## 2020-03-18 NOTE — Discharge Summary (Addendum)
Physician Discharge Summary Note  Patient:  Mark [name] is an 34 y.o., male MRN:  [id number] DOB:  09-Feb-1986 Patient phone:  [phone] (home)  Patient address:   [address] [place] [address],   Total Time spent with patient: Greater than 30 minutes  Date of Admission:  03/13/2020  Date of Discharge: 03-18-20  Reason for Admission: Suicide attempt by overdose on insulin.  Principal Problem: MDD (major depressive disorder), recurrent episode, severe [facility])  Discharge Diagnoses: Patient Active Problem List   Diagnosis Date Noted  . MDD (major depressive disorder), recurrent episode, severe (HCC) [F33.2] 03/13/2020  . Drug overdose, intentional (HCC) [T50.902A] 03/05/2020  . Drug overdose [T50.901A] 03/05/2020  . Drug abuse (HCC) [F19.10] 03/05/2020  . Attempted suicide (HCC) [T14.91XA] 03/05/2020  . Insulin overdose [T38.3X1A]   . Hypoglycemia [E16.2] 02/03/2020  . Hypothyroidism [E03.9] 02/02/2020  . Diabetic polyneuropathy associated with type 1 diabetes mellitus (HCC) [E10.42] 02/02/2020  . Uncontrolled type 1 diabetes mellitus with hypoglycemia without coma, with long-term current use of insulin (HCC) [E10.649] 12/30/2019  . DKA (diabetic ketoacidosis) (HCC) [E11.10] 12/29/2019  . Homelessness [Z59.0] 12/29/2019  . Marijuana abuse [F12.10] 12/29/2019  . Tobacco dependence [F17.200] 12/29/2019  . Seizure disorder (HCC) [G40.909] 12/29/2019  . Suicidal ideation [R45.851]   . Bipolar I disorder, most recent episode depressed, severe without psychotic features (HCC) [F31.4]   . Adjustment disorder with depressed mood [F43.21] 12/07/2017  . Noncompliance [Z91.19] 10/17/2017  . Dysthymia [F34.1] 10/08/2017  . Personality disorder in adult [facility]) [F60.9] 09/26/2017  . 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 Obstructive hydrocephalus (HCC) [G91.1] 09/19/2017  . MDD (major depressive disorder) [F32.9] 08/22/2017  . Severe recurrent major depression with psychotic  features (HCC) [F33.3] 08/19/2017  . Abdominal pain [R10.9] 08/18/2017  . Hyponatremia [E87.1] 08/18/2017  . DKA (diabetic ketoacidoses) (HCC) [E11.10] 11/24/2015  . Diabetic keto-acidosis (HCC) [E11.10] 10/23/2015    Past Medical History:  Past Medical History:  Diagnosis Date  . Anxiety   . Bipolar disorder (HCC)   . Depression   . Diabetes mellitus without complication (HCC)   . Diabetic polyneuropathy associated with type 1 diabetes mellitus (HCC)   . Hydrocephalus (HCC)   . Kidney stones   . Seizures (HCC)     Past Surgical History:  Procedure Laterality Date  . KIDNEY STONE SURGERY     Family History:  Family History  Problem Relation Age of Onset  . Breast cancer Mother    Social History:  Social History   Substance and Sexual Activity  Alcohol Use Never   Comment: occassional      Social History   Substance and Sexual Activity  Drug Use Yes  . Frequency: 7.0 times per week  . Types: Marijuana   Comment: smoked 2-3 days ago, usually smokes daily    Social History   Socioeconomic History  . Marital status: Single    Spouse name: Not on file  . Number of children: Not on file  . Years of education: Not on file  . Highest education level: Not on file  Occupational History  . Not on file  Tobacco Use  . Smoking status: Current Every Day Smoker    Packs/day: 1.00    Years: 20.00    Pack years: 20.00    Types: Cigarettes  . Smokeless tobacco: Never Used  Substance and Sexual Activity  . Alcohol use: Never    Comment: occassional   . Drug use: Yes    Frequency: 7.0 times per  week    Types: Marijuana    Comment: smoked 2-3 days ago, usually smokes daily  . Sexual activity: Not Currently  Other Topics Concern  . Not on file  Social History Narrative  . Not on file   Social Determinants of Health   Financial Resource Strain:   . Difficulty of Paying Living Expenses:   Food Insecurity:   . Worried About Programme researcher, broadcasting/film/videounning Out of Food in the Last Year:    . Baristaan Out of Food in the Last Year:   Transportation Needs:   . Freight forwarderLack of Transportation (Medical):   Marland Kitchen. Lack of Transportation (Non-Medical):   Physical Activity:   . Days of Exercise per Week:   . Minutes of Exercise per Session:   Stress:   . Feeling of Stress :   Social Connections:   . Frequency of Communication with Friends and Family:   . Frequency of Social Gatherings with Friends and Family:   . Attends Religious Services:   . Active Member of Clubs or Organizations:   . Attends BankerClub or Organization Meetings:   Marland Kitchen. Marital Status:    Hospital Course: (Per Md's admission evaluation notes): [age] y old male, presented to ED on 5/15 following overdose on insulin. (Reports he took about 80 units of insulin ). In ED was hypoglycemic , requiring correction with D50. He was admitted to medical unit for stabilization.  He was States that his roommate witnessed overdose and called 911. He describes overdose as impulsive / unplanned. States he has been feeling depressed " for a long time", particularly after his mother passed away 3 years ago. He identifies stressors including limited support network, mother's death, pet dog's death 2 years ago as contributing factors. He also reports that [place], and had continued taking his  medications which had been prescribed there until they ran out about a month ago ( Paxil, Seroquel, Lyrica,Depakote). He states he did continue to take insulin as prescribed. Endorses neuro-vegetative symptoms of depression as below. Denies psychotic symptoms. He was seen by psychiatry consultant during his medical admission and inpatient psychiatric admission upon medical clearance was recommended.  After the above admission assessment, Mark [name] NeedleMichael was recommended for mood stabilization treatments. The medication regimen for his presenting symptoms were discussed & with his consent initiated. [name] received, stabilized & was discharged on the  medications as listed below on his discharge medication lists. He was also enrolled in the group counseling sessions & activities being offered & held on this unit. He participated actively in the group counseling sessions & learned coping skills. As his mental health treatment progressed, Mark [name] NeedleMichael was observed on daily basis struggling to maintain a stable blood sugar levels. Although, he was both on the sliding scale insulin & routine insulin regimen to manage his blood sugar, this proved to be an uphill battle as Mark [name] NeedleMichael is noted to not adhere to his recommended diabetic diet. He was eating what ever he wants & when he want it. He stacked-up in his room multiple jars of foods not recommended for him. But, upon his discharge, his blood sugar was at a more normal level. Besides the mood stabilization treatments, Mark [name] NeedleMichael also received/discharged on other medication regimen for his other pre-existing medical issues presented. He tolerated his treatment regimen without any significant adverse effects and or reactions reported.   During the course of his hospitalization, the 15-minute checks were adequate to ensure Breandan's safety.  Patient did not display any dangerous violent  or suicidal behavior on the unit.  He interacted with patients & staff appropriately, participated appropriately in the group sessions/therapies. His medications were addressed & adjusted to meet his needs. He was recommended for outpatient follow-up care & medication management upon discharge to assure continuity of care & mood stability. He was also referred to the [facility] for his medical care needs.  At the time of discharge, patient is not reporting any acute suicidal/homicidal ideations. He feels more confident about his self-care & in managing the suicidal ideations. He currently denies any new issues or concerns. Education and supportive counseling provided throughout his hospital stay & upon discharge.  Today  upon his discharge evaluation with the attending psychiatrist, Davion shares he is doing well. He denies any other specific concerns. He is sleeping well. His appetite is good. He denies other physical complaints. He denies AH/VH. he feels that his medications have been helpful & is in agreement to continue his current treatment regimen. He was able to engage in safety planning including plan to return to [facility] or contact emergency services if he feels unable to maintain his own safety or the safety of others. Pt had no further questions, comments, or concerns. He left [facility] with all personal belongings in no apparent distress. Transportation scheduled by the [facility] social worker.  Physical Findings: AIMS: Facial and Oral Movements Muscles of Facial Expression: None, normal Lips and Perioral Area: None, normal Jaw: None, normal Tongue: None, normal,Extremity Movements Upper (arms, wrists, hands, fingers): None, normal Lower (legs, knees, ankles, toes): None, normal, Trunk Movements Neck, shoulders, hips: None, normal, Overall Severity Severity of abnormal movements (highest score from questions above): None, normal Incapacitation due to abnormal movements: None, normal Patient's awareness of abnormal movements (rate only patient's report): No Awareness, Dental Status Current problems with teeth and/or dentures?: Yes Does patient usually wear dentures?: No  CIWA:  CIWA-Ar Total: 6 COWS:  COWS Total Score: 3  Musculoskeletal: Strength & Muscle Tone: within normal limits Gait & Station: unsteady, ataxic, broad based Patient leans: N/A  Psychiatric Specialty Exam: Physical Exam  Nursing note and vitals reviewed. Constitutional: He is oriented to person, place, and time. He appears well-developed.  HENT:  Head: Normocephalic.  Eyes: Pupils are equal, round, and reactive to light.  Cardiovascular: Normal rate.  Respiratory: Effort normal.  Genitourinary:    Genitourinary Comments: Deferred    Musculoskeletal:        General: Normal range of motion.     Cervical back: Normal range of motion.  Neurological: He is alert and oriented to person, place, and time.  Skin: Skin is warm and dry.  Psychiatric: His speech is normal and behavior is normal. Thought content normal. His mood appears anxious. Cognition and memory are normal. He expresses impulsivity.    Review of Systems  Constitutional: Negative for chills, diaphoresis and fever.  HENT: Negative for congestion and sore throat.   Eyes: Negative for blurred vision.  Respiratory: Negative for cough, shortness of breath and wheezing.   Cardiovascular: Negative for chest pain and palpitations.  Gastrointestinal: Negative for diarrhea, heartburn, nausea and vomiting.  Genitourinary: Negative for dysuria.  Musculoskeletal: Negative for myalgias.  Skin: Negative.   Neurological: Negative for dizziness, tremors, speech change, focal weakness, seizures, weakness and headaches.  Endo/Heme/Allergies:       Allergies: Sulfa drugs, PCN, Clindamycin.  Food: mushcroom  Psychiatric/Behavioral: Positive for depression (Stabilized with medication prior to discharge) and substance abuse ( Hx. THC use disorders.). Negative for hallucinations,  memory loss and suicidal ideas. The patient has insomnia (Stabilized with medication prior to discharge). The patient is not nervous/anxious (Stable).   All other systems reviewed and are negative.   Blood pressure 119/76, pulse 94, temperature 97.6 F (36.4 C), temperature source Oral, resp. rate 18, SpO2 99 %.There is no height or weight on file to calculate BMI.  See Md's discharge SRA  Sleep:  Number of Hours: 5.5   Have you used any form of tobacco in the last 30 days? (Cigarettes, Smokeless Tobacco, Cigars, and/or Pipes): Yes  Has this patient used any form of tobacco in the last 30 days? (Cigarettes, Smokeless Tobacco, Cigars, and/or Pipes): Yes, an FDA-approved tobacco cessation medication was  recommended at discharge.  Blood Alcohol level:  Lab Results  Component Value Date   ETH <10 03/05/2020   ETH <10 01/02/2020   Metabolic Disorder Labs:  Lab Results  Component Value Date   HGBA1C 8.8 (H) 02/03/2020   MPG 206 02/03/2020   MPG 194.38 12/29/2019   No results found for: PROLACTIN Lab Results  Component Value Date   CHOL 214 (H) 03/05/2020   TRIG 81 03/05/2020   HDL 45 03/05/2020   CHOLHDL 4.8 03/05/2020   VLDL 16 03/05/2020   LDLCALC 153 (H) 03/05/2020   LDLCALC 141 (H) 09/11/2017   See Psychiatric Specialty Exam and Suicide Risk Assessment completed by Attending Physician prior to discharge.  Discharge destination:  Home  Is patient on multiple antipsychotic therapies at discharge:  No   Has Patient had three or more failed trials of antipsychotic monotherapy by history:  No  Recommended Plan for Multiple Antipsychotic Therapies: NA  Allergies as of 03/18/2020      Reactions   Mushroom Extract Complex Hives, Itching, Nausea And Vomiting   Penicillins Anaphylaxis, Hives, Swelling   Has patient had a PCN reaction causing immediate rash, facial/tongue/throat swelling, SOB or lightheadedness with hypotension: Yes Has patient had a PCN reaction causing severe rash involving mucus membranes or skin necrosis: No Has patient had a PCN reaction that required hospitalization: Yes Has patient had a PCN reaction occurring within the last 10 years: Yes If all of the above answers are "NO", then may proceed with Cephalosporin use.   Sulfa Antibiotics Anaphylaxis, Hives   Clindamycin/lincomycin Hives      Medication List    STOP taking these medications   traZODone 50 MG tablet Commonly known as: DESYREL     TAKE these medications     Indication  atorvastatin 20 MG tablet Commonly known as: LIPITOR Take 1 tablet (20 mg total) by mouth daily.  Indication: Inherited Heterozygous Hypercholesterolemia, High Amount of Fats in the Blood   divalproex 250 MG DR  tablet Commonly known as: DEPAKOTE Take 3 tablets (750 mg total) by mouth every evening. For mood stabilization What changed: You were already taking a medication with the same name, and this prescription was added. Make sure you understand how and when to take each.  Indication: Mood stabilization   divalproex 500 MG DR tablet Commonly known as: DEPAKOTE Take 1 tablet (500 mg total) by mouth daily. For mood stabilization Start taking on: Mar 19, 2020 What changed:   when to take this  additional instructions  Indication: Mood stabilization   escitalopram 20 MG tablet Commonly known as: LEXAPRO Take 1 tablet (20 mg total) by mouth daily. For depression Start taking on: Mar 19, 2020 What changed:   medication strength  how much to take  additional instructions  Indication: Major Depressive Disorder   insulin aspart 100 UNIT/ML injection Commonly known as: novoLOG Inject 0-9 Units into the skin 4 (four) times daily -  before meals and at bedtime.  Indication: Type 2 Diabetes   insulin aspart 100 UNIT/ML injection Commonly known as: novoLOG Inject 10 Units into the skin 3 (three) times daily with meals.  Indication: Type 2 Diabetes   insulin detemir 100 UNIT/ML injection Commonly known as: LEVEMIR Inject 0.4 mLs (40 Units total) into the skin at bedtime. For diabetes management What changed:   how much to take  when to take this  additional instructions  Indication: Type 2 Diabetes   levothyroxine 100 MCG tablet Commonly known as: SYNTHROID Take 1 tablet (100 mcg total) by mouth daily before breakfast.  Indication: Underactive Thyroid   melatonin 3 MG Tabs tablet Take 2 tablets (6 mg total) by mouth at bedtime as needed. For sleep What changed:   reasons to take this  additional instructions  Indication: Trouble Sleeping   nicotine polacrilex 2 MG gum Commonly known as: NICORETTE Take 1 each (2 mg total) by mouth as needed. (may buy from over the  counter): For smoking cessation  Indication: Nicotine Addiction   polyethylene glycol 17 g packet Commonly known as: MIRALAX / GLYCOLAX Take 17 g by mouth daily. (May buy from over the counter): For constipation Start taking on: Mar 19, 2020  Indication: Constipation   pregabalin 150 MG capsule Commonly known as: LYRICA Take 1 capsule (150 mg total) by mouth 2 (two) times daily.  Indication: Neuropathic Pain   QUEtiapine 300 MG tablet Commonly known as: SEROQUEL Take 1 tablet (300 mg total) by mouth at bedtime. For mood control What changed:   medication strength  how much to take  additional instructions  Indication: Mood control      Follow-up Information    [facility] Follow up on 03/25/2020.   Why: You have an appointment on 03/25/20 at 8:30 am.  This will be a virtual tele-health appointment.   Contact information: [address]  [address] [place] [address] [place] Follow up.   Why: Walk-in appt for your medical care needs. Phone # listed above ! Contact information: [address] [place] [address] [phone]         Follow-up recommendations: Activity:  As tolerated Diet: As recommended by your primary care doctor. Keep all scheduled follow-up appointments as recommended.   Comments: Prescriptions given at discharge.  Patient agreeable to plan.  Given opportunity to ask questions.  Appears to feel comfortable with discharge denies any current suicidal or homicidal thought. Patient is also instructed prior to discharge to: Take all medications as prescribed by his/her mental healthcare provider. Report any adverse effects and or reactions from the medicines to his/her outpatient provider promptly. Patient has been instructed & cautioned: To not engage in alcohol and or illegal drug use while on prescription medicines. In the event of worsening symptoms, patient is instructed  to call the crisis hotline, 911 and or go to the nearest ED for appropriate evaluation and treatment of symptoms. To follow-up with his/her primary care provider for your other medical issues, concerns and or health care needs.   Signed: Lindell Spar, NP, PMHNP, FNP-BC 03/18/2020, 9:39 AM Patient seen, Suicide Assessment Completed.  Disposition Plan Reviewed

## 2020-03-18 NOTE — Progress Notes (Signed)
RN met with pt and reviewed pt's discharge instructions.  Pt verbalized understanding of discharge instructions and pt did not have any questions. RN returned pt's belongings to pt. Prescriptions and samples were given to pt.  Pt denied SI/HI/AVH and voiced no concerns.  Pt was appreciative of the care pt received at [facility]. Patient discharged to the lobby where Lyft driver was waiting to take pt home.

## 2020-03-23 ENCOUNTER — Encounter (HOSPITAL_COMMUNITY): Payer: Self-pay

## 2020-03-23 ENCOUNTER — Other Ambulatory Visit: Payer: Self-pay

## 2020-03-23 ENCOUNTER — Inpatient Hospital Stay (HOSPITAL_COMMUNITY)
Admission: EM | Admit: 2020-03-23 | Discharge: 2020-03-25 | DRG: 603 | Disposition: A | Discharge status: Home / Self Care | Payer: Self-pay | Attending: Family Medicine | Admitting: Family Medicine

## 2020-03-23 DIAGNOSIS — Z79899 Other long term (current) drug therapy: Secondary | ICD-10-CM

## 2020-03-23 DIAGNOSIS — Z9119 Patient's noncompliance with other medical treatment and regimen: Secondary | ICD-10-CM

## 2020-03-23 DIAGNOSIS — E039 Hypothyroidism, unspecified: Secondary | ICD-10-CM | POA: Diagnosis present

## 2020-03-23 DIAGNOSIS — E1069 Type 1 diabetes mellitus with other specified complication: Secondary | ICD-10-CM | POA: Diagnosis present

## 2020-03-23 DIAGNOSIS — E1042 Type 1 diabetes mellitus with diabetic polyneuropathy: Secondary | ICD-10-CM | POA: Diagnosis present

## 2020-03-23 DIAGNOSIS — F419 Anxiety disorder, unspecified: Secondary | ICD-10-CM | POA: Diagnosis present

## 2020-03-23 DIAGNOSIS — Z803 Family history of malignant neoplasm of breast: Secondary | ICD-10-CM

## 2020-03-23 DIAGNOSIS — L03116 Cellulitis of left lower limb: Principal | ICD-10-CM | POA: Diagnosis present

## 2020-03-23 DIAGNOSIS — Z590 Homelessness: Secondary | ICD-10-CM

## 2020-03-23 DIAGNOSIS — E1065 Type 1 diabetes mellitus with hyperglycemia: Secondary | ICD-10-CM | POA: Diagnosis present

## 2020-03-23 DIAGNOSIS — R569 Unspecified convulsions: Secondary | ICD-10-CM | POA: Diagnosis present

## 2020-03-23 DIAGNOSIS — F1910 Other psychoactive substance abuse, uncomplicated: Secondary | ICD-10-CM | POA: Diagnosis present

## 2020-03-23 DIAGNOSIS — A46 Erysipelas: Secondary | ICD-10-CM | POA: Diagnosis present

## 2020-03-23 DIAGNOSIS — Z881 Allergy status to other antibiotic agents status: Secondary | ICD-10-CM

## 2020-03-23 DIAGNOSIS — Z79890 Hormone replacement therapy (postmenopausal): Secondary | ICD-10-CM

## 2020-03-23 DIAGNOSIS — E038 Other specified hypothyroidism: Secondary | ICD-10-CM

## 2020-03-23 DIAGNOSIS — Z880 Allergy status to penicillin: Secondary | ICD-10-CM

## 2020-03-23 DIAGNOSIS — Z882 Allergy status to sulfonamides status: Secondary | ICD-10-CM

## 2020-03-23 DIAGNOSIS — E870 Hyperosmolality and hypernatremia: Secondary | ICD-10-CM | POA: Diagnosis present

## 2020-03-23 DIAGNOSIS — Z20822 Contact with and (suspected) exposure to covid-19: Secondary | ICD-10-CM | POA: Diagnosis present

## 2020-03-23 DIAGNOSIS — Z794 Long term (current) use of insulin: Secondary | ICD-10-CM

## 2020-03-23 DIAGNOSIS — Z915 Personal history of self-harm: Secondary | ICD-10-CM

## 2020-03-23 DIAGNOSIS — F319 Bipolar disorder, unspecified: Secondary | ICD-10-CM | POA: Diagnosis present

## 2020-03-23 DIAGNOSIS — Z87442 Personal history of urinary calculi: Secondary | ICD-10-CM

## 2020-03-23 DIAGNOSIS — Z87891 Personal history of nicotine dependence: Secondary | ICD-10-CM

## 2020-03-23 DIAGNOSIS — Z91048 Other nonmedicinal substance allergy status: Secondary | ICD-10-CM

## 2020-03-23 LAB — COMPREHENSIVE METABOLIC PANEL
ALT: 19 U/L (ref 0–44)
AST: 18 U/L (ref 15–41)
Albumin: 3.9 g/dL (ref 3.5–5.0)
Alkaline Phosphatase: 88 U/L (ref 38–126)
Anion gap: 11 (ref 5–15)
BUN: 10 mg/dL (ref 6–20)
CO2: 25 mmol/L (ref 22–32)
Calcium: 8.8 mg/dL — ABNORMAL LOW (ref 8.9–10.3)
Chloride: 102 mmol/L (ref 98–111)
Creatinine, Ser: 1.11 mg/dL (ref 0.61–1.24)
GFR calc Af Amer: >60 mL/min (ref >60)
GFR calc non Af Amer: >60 mL/min (ref >60)
Glucose, Bld: 194 mg/dL — ABNORMAL HIGH (ref 70–99)
Potassium: 3.5 mmol/L (ref 3.5–5.1)
Sodium: 138 mmol/L (ref 135–145)
Total Bilirubin: 0.7 mg/dL (ref 0.3–1.2)
Total Protein: 7.5 g/dL (ref 6.5–8.1)

## 2020-03-23 LAB — CBC WITH DIFFERENTIAL/PLATELET
Abs Immature Granulocytes: 0.07 10*3/uL (ref 0.00–0.07)
Basophils Absolute: 0.1 10*3/uL (ref 0.0–0.1)
Basophils Relative: 0 %
Eosinophils Absolute: 0 10*3/uL (ref 0.0–0.5)
Eosinophils Relative: 0 %
HCT: 35 % — ABNORMAL LOW (ref 39.0–52.0)
Hemoglobin: 11.4 g/dL — ABNORMAL LOW (ref 13.0–17.0)
Immature Granulocytes: 0 %
Lymphocytes Relative: 17 %
Lymphs Abs: 2.6 10*3/uL (ref 0.7–4.0)
MCH: 29.7 pg (ref 26.0–34.0)
MCHC: 32.6 g/dL (ref 30.0–36.0)
MCV: 91.1 fL (ref 80.0–100.0)
Monocytes Absolute: 1.4 10*3/uL — ABNORMAL HIGH (ref 0.1–1.0)
Monocytes Relative: 9 %
Neutro Abs: 11.6 10*3/uL — ABNORMAL HIGH (ref 1.7–7.7)
Neutrophils Relative %: 74 %
Platelets: 260 10*3/uL (ref 150–400)
RBC: 3.84 MIL/uL — ABNORMAL LOW (ref 4.22–5.81)
RDW: 14.7 % (ref 11.5–15.5)
WBC: 15.8 10*3/uL — ABNORMAL HIGH (ref 4.0–10.5)
nRBC: 0 % (ref 0.0–0.2)

## 2020-03-23 LAB — GLUCOSE, CAPILLARY
Glucose-Capillary: 99 mg/dL (ref 70–99)
Glucose-Capillary: 133 mg/dL — ABNORMAL HIGH (ref 70–99)

## 2020-03-23 LAB — SARS CORONAVIRUS 2 BY RT PCR (HOSPITAL ORDER, PERFORMED IN ~~LOC~~ HOSPITAL LAB): SARS Coronavirus 2: NEGATIVE

## 2020-03-23 LAB — URINALYSIS, ROUTINE W REFLEX MICROSCOPIC
Bacteria, UA: NONE SEEN
Bilirubin Urine: NEGATIVE
Glucose, UA: >=500 mg/dL — AB
Hgb urine dipstick: NEGATIVE
Ketones, ur: NEGATIVE mg/dL
Leukocytes,Ua: NEGATIVE
Nitrite: NEGATIVE
Protein, ur: 100 mg/dL — AB
Specific Gravity, Urine: 1.023 (ref 1.005–1.030)
pH: 5 (ref 5.0–8.0)

## 2020-03-23 LAB — LACTIC ACID, PLASMA: Lactic Acid, Venous: 1.5 mmol/L (ref 0.5–1.9)

## 2020-03-23 LAB — CBG MONITORING, ED: Glucose-Capillary: 218 mg/dL — ABNORMAL HIGH (ref 70–99)

## 2020-03-23 LAB — HEMOGLOBIN A1C
Hgb A1c MFr Bld: 8.8 % — ABNORMAL HIGH (ref 4.8–5.6)
Mean Plasma Glucose: 205.86 mg/dL

## 2020-03-23 MED ORDER — IBUPROFEN 800 MG PO TABS
800.0000 mg | ORAL_TABLET | Freq: Once | ORAL | Status: AC
  Administered 2020-03-23: 800 mg via ORAL
  Filled 2020-03-23: qty 1

## 2020-03-23 MED ORDER — SODIUM CHLORIDE 0.9 % IV SOLN
1.0000 g | Freq: Three times a day (TID) | INTRAVENOUS | Status: DC
  Administered 2020-03-23 – 2020-03-24 (×2): 1 g via INTRAVENOUS
  Filled 2020-03-23 (×3): qty 1

## 2020-03-23 MED ORDER — INSULIN DETEMIR 100 UNIT/ML ~~LOC~~ SOLN
40.0000 [IU] | Freq: Every day | SUBCUTANEOUS | Status: DC
  Administered 2020-03-23: 40 [IU] via SUBCUTANEOUS
  Filled 2020-03-23: qty 0.4

## 2020-03-23 MED ORDER — ONDANSETRON HCL 4 MG/2ML IJ SOLN: 4.0000 mg | Freq: Four times a day (QID) | INTRAMUSCULAR | Status: DC | PRN

## 2020-03-23 MED ORDER — ESCITALOPRAM OXALATE 10 MG PO TABS
20.0000 mg | ORAL_TABLET | Freq: Every day | ORAL | Status: DC
  Administered 2020-03-23 – 2020-03-25 (×3): 20 mg via ORAL
  Filled 2020-03-23 (×3): qty 2

## 2020-03-23 MED ORDER — ATORVASTATIN CALCIUM 10 MG PO TABS
20.0000 mg | ORAL_TABLET | Freq: Every evening | ORAL | Status: DC
  Administered 2020-03-23 – 2020-03-24 (×2): 20 mg via ORAL
  Filled 2020-03-23 (×2): qty 2

## 2020-03-23 MED ORDER — SODIUM CHLORIDE 0.9 % IV BOLUS
1000.0000 mL | Freq: Once | INTRAVENOUS | Status: AC
  Administered 2020-03-23: 1000 mL via INTRAVENOUS

## 2020-03-23 MED ORDER — INSULIN ASPART 100 UNIT/ML ~~LOC~~ SOLN
0.0000 [IU] | Freq: Every day | SUBCUTANEOUS | Status: DC
  Filled 2020-03-23: qty 0.05

## 2020-03-23 MED ORDER — PREGABALIN 50 MG PO CAPS
150.0000 mg | ORAL_CAPSULE | Freq: Two times a day (BID) | ORAL | Status: DC
  Administered 2020-03-23 – 2020-03-25 (×4): 150 mg via ORAL
  Filled 2020-03-23 (×4): qty 3

## 2020-03-23 MED ORDER — ONDANSETRON HCL 4 MG/2ML IJ SOLN
4.0000 mg | Freq: Once | INTRAMUSCULAR | Status: AC
  Administered 2020-03-23: 4 mg via INTRAVENOUS
  Filled 2020-03-23: qty 2

## 2020-03-23 MED ORDER — INSULIN ASPART PROT & ASPART (70-30 MIX) 100 UNIT/ML ~~LOC~~ SUSP: 35.0000 [IU] | Freq: Two times a day (BID) | SUBCUTANEOUS | Status: DC

## 2020-03-23 MED ORDER — QUETIAPINE FUMARATE 300 MG PO TABS
300.0000 mg | ORAL_TABLET | Freq: Every day | ORAL | Status: DC
  Administered 2020-03-23 – 2020-03-24 (×2): 300 mg via ORAL
  Filled 2020-03-23 (×2): qty 1

## 2020-03-23 MED ORDER — INSULIN ASPART 100 UNIT/ML ~~LOC~~ SOLN
10.0000 [IU] | Freq: Three times a day (TID) | SUBCUTANEOUS | Status: DC
  Administered 2020-03-24: 10 [IU] via SUBCUTANEOUS
  Filled 2020-03-23: qty 0.1

## 2020-03-23 MED ORDER — DIVALPROEX SODIUM 500 MG PO DR TAB
750.0000 mg | DELAYED_RELEASE_TABLET | Freq: Every evening | ORAL | Status: DC
  Administered 2020-03-23: 500 mg via ORAL
  Administered 2020-03-24: 750 mg via ORAL
  Filled 2020-03-23 (×3): qty 1

## 2020-03-23 MED ORDER — LACTATED RINGERS IV BOLUS: 1000.0000 mL | Freq: Once | INTRAVENOUS | Status: DC

## 2020-03-23 MED ORDER — MORPHINE SULFATE (PF) 4 MG/ML IV SOLN
4.0000 mg | Freq: Once | INTRAVENOUS | Status: AC
  Administered 2020-03-23: 4 mg via INTRAVENOUS
  Filled 2020-03-23: qty 1

## 2020-03-23 MED ORDER — LACTATED RINGERS IV BOLUS
1000.0000 mL | Freq: Once | INTRAVENOUS | Status: AC
  Administered 2020-03-23: 1000 mL via INTRAVENOUS

## 2020-03-23 MED ORDER — ENOXAPARIN SODIUM 40 MG/0.4ML ~~LOC~~ SOLN
40.0000 mg | SUBCUTANEOUS | Status: DC
  Administered 2020-03-23: 40 mg via SUBCUTANEOUS
  Filled 2020-03-23: qty 0.4

## 2020-03-23 MED ORDER — VANCOMYCIN HCL IN DEXTROSE 1-5 GM/200ML-% IV SOLN: 1000.0000 mg | Freq: Once | INTRAVENOUS | Status: DC

## 2020-03-23 MED ORDER — SODIUM CHLORIDE 0.9 % IV SOLN: INTRAVENOUS | Status: DC

## 2020-03-23 MED ORDER — ONDANSETRON HCL 4 MG PO TABS: 4.0000 mg | ORAL_TABLET | Freq: Four times a day (QID) | ORAL | Status: DC | PRN

## 2020-03-23 MED ORDER — ACETAMINOPHEN 650 MG RE SUPP: 650.0000 mg | Freq: Four times a day (QID) | RECTAL | Status: DC | PRN

## 2020-03-23 MED ORDER — INSULIN ASPART 100 UNIT/ML ~~LOC~~ SOLN
0.0000 [IU] | Freq: Three times a day (TID) | SUBCUTANEOUS | Status: DC
  Administered 2020-03-24: 5 [IU] via SUBCUTANEOUS
  Filled 2020-03-23: qty 0.15

## 2020-03-23 MED ORDER — OXYCODONE HCL 5 MG PO TABS
5.0000 mg | ORAL_TABLET | ORAL | Status: DC | PRN
  Administered 2020-03-23 – 2020-03-25 (×10): 5 mg via ORAL
  Filled 2020-03-23 (×11): qty 1

## 2020-03-23 MED ORDER — LEVOTHYROXINE SODIUM 100 MCG PO TABS
100.0000 ug | ORAL_TABLET | Freq: Every day | ORAL | Status: DC
  Administered 2020-03-24 – 2020-03-25 (×2): 100 ug via ORAL
  Filled 2020-03-23 (×2): qty 1

## 2020-03-23 MED ORDER — ACETAMINOPHEN 325 MG PO TABS
650.0000 mg | ORAL_TABLET | Freq: Four times a day (QID) | ORAL | Status: DC | PRN
  Administered 2020-03-25: 650 mg via ORAL
  Filled 2020-03-23: qty 2

## 2020-03-23 MED ORDER — VANCOMYCIN HCL 2000 MG/400ML IV SOLN
2000.0000 mg | Freq: Once | INTRAVENOUS | Status: AC
  Administered 2020-03-23: 2000 mg via INTRAVENOUS
  Filled 2020-03-23: qty 400

## 2020-03-23 MED ORDER — SODIUM CHLORIDE 0.9 % IV SOLN
1.0000 g | Freq: Once | INTRAVENOUS | Status: DC
  Administered 2020-03-23: 1 g via INTRAVENOUS
  Filled 2020-03-23: qty 1

## 2020-03-23 MED ORDER — DIVALPROEX SODIUM 500 MG PO DR TAB
500.0000 mg | DELAYED_RELEASE_TABLET | Freq: Every day | ORAL | Status: DC
  Administered 2020-03-24 – 2020-03-25 (×2): 500 mg via ORAL
  Filled 2020-03-23 (×2): qty 2
  Filled 2020-03-23 (×2): qty 1
  Filled 2020-03-23: qty 2

## 2020-03-23 MED ORDER — VANCOMYCIN HCL 1750 MG/350ML IV SOLN
1750.0000 mg | Freq: Two times a day (BID) | INTRAVENOUS | Status: DC
  Administered 2020-03-24: 1750 mg via INTRAVENOUS
  Filled 2020-03-23: qty 350

## 2020-03-23 NOTE — ED Notes (Signed)
Transport called for transport to Inpatient room

## 2020-03-23 NOTE — ED Triage Notes (Signed)
Per EMS- Patient is from home. Patient c/o left leg swelling from the ankle to the upper thigh.

## 2020-03-23 NOTE — ED Provider Notes (Signed)
Accepted handoff at shift change from [facility]. Please see prior provider note for more detail.   Briefly: Patient is 34 y.o. past medical history significant for insulin-dependent DM 1, seizures, bipolar, polysubstance abuse, medical history of bladder tumor since presented today with left leg pain found to have significant infection of the leg.  He has been started on vancomycin and has blood cultures and blood work pending.  No code sepsis initiated as patient only has mildly elevated temperature and is not tachycardic or hypotensive. Symptoms began 4 days ago when patient ripped off his left great toenail while biking.  He states since that time he said aggressive worsening of left leg pain that has ascended up to the level of his thigh.  He states that the pain is severe 10/10.  DDX: concern for lymphangitis, cellulitis rapidly progressing.  Plan: Follow-up on blood work and disposition.  Prior provider suspected admission would be required.   Physical Exam  BP 120/69   Pulse 79   Temp 100 F (37.8 C) (Oral)   Resp 19   Ht 6\' 2"  (1.88 m)   Wt 113.4 kg   SpO2 99%   BMI 32.10 kg/m   CONSTITUTIONAL:  well-appearing, NAD NEURO:  Alert and oriented x 3, no focal deficits EYES:  pupils equal and reactive ENT/NECK:  trachea midline, no JVD CARDIO:  reg rate, reg rhythm, well-perfused PULM:  None labored breathing GI/GU:  Abdomen non-distended MSK/SPINE:  No gross deformities, no edema SKIN:   Significant cellulitis of the left lower extremity.  It is circumferential in the shin and wraps posteriorly proximally to the thigh where it affects the medial, anterior and posterior aspect of the thigh.  It is well demarcated and holds short of the groin.  No crepitus, no purulence, fluctuance or lesions. PSYCH:  Appropriate speech and behavior           ED Course/Procedures     Procedures  Results for orders placed or performed during the hospital encounter of 03/23/20    Comprehensive metabolic panel  Result Value Ref Range   Sodium 138 135 - 145 mmol/L   Potassium 3.5 3.5 - 5.1 mmol/L   Chloride 102 98 - 111 mmol/L   CO2 25 22 - 32 mmol/L   Glucose, Bld 194 (H) 70 - 99 mg/dL   BUN 10 6 - 20 mg/dL   Creatinine, Ser 1.11 0.61 - 1.24 mg/dL   Calcium 8.8 (L) 8.9 - 10.3 mg/dL   Total Protein 7.5 6.5 - 8.1 g/dL   Albumin 3.9 3.5 - 5.0 g/dL   AST 18 15 - 41 U/L   ALT 19 0 - 44 U/L   Alkaline Phosphatase 88 38 - 126 U/L   Total Bilirubin 0.7 0.3 - 1.2 mg/dL   GFR calc non Af Amer >60 >60 mL/min   GFR calc Af Amer >60 >60 mL/min   Anion gap 11 5 - 15  CBC with Differential  Result Value Ref Range   WBC 15.8 (H) 4.0 - 10.5 K/uL   RBC 3.84 (L) 4.22 - 5.81 MIL/uL   Hemoglobin 11.4 (L) 13.0 - 17.0 g/dL   HCT 35.0 (L) 39.0 - 52.0 %   MCV 91.1 80.0 - 100.0 fL   MCH 29.7 26.0 - 34.0 pg   MCHC 32.6 30.0 - 36.0 g/dL   RDW 14.7 11.5 - 15.5 %   Platelets 260 150 - 400 K/uL   nRBC 0.0 0.0 - 0.2 %   Neutrophils Relative %  74 %   Neutro Abs 11.6 (H) 1.7 - 7.7 K/uL   Lymphocytes Relative 17 %   Lymphs Abs 2.6 0.7 - 4.0 K/uL   Monocytes Relative 9 %   Monocytes Absolute 1.4 (H) 0.1 - 1.0 K/uL   Eosinophils Relative 0 %   Eosinophils Absolute 0.0 0.0 - 0.5 K/uL   Basophils Relative 0 %   Basophils Absolute 0.1 0.0 - 0.1 K/uL   Immature Granulocytes 0 %   Abs Immature Granulocytes 0.07 0.00 - 0.07 K/uL  Lactic acid, plasma  Result Value Ref Range   Lactic Acid, Venous 1.5 0.5 - 1.9 mmol/L  CBG monitoring, ED  Result Value Ref Range   Glucose-Capillary 218 (H) 70 - 99 mg/dL   Comment 1 Notify RN    Comment 2 Document in Chart    No results found.   MDM   Patient with rapidly progressing cellulitis/erysipelas.  Presented today with mild elevation in temperature p.o. likely true low-grade temperature rectally.  This was not assessed.  He is received IV fluids and ibuprofen and morphine and Zofran so far.  I provided patient with vancomycin the second  liter weight in the form of lactated Ringer's.  Discussed case with my attending physician and plan to admit.  Patient has neutrophil predominant leukocytosis of 15.8.  Use WBC was 8.67 days ago.  Blood cultures are in process prior to vancomycin ministration.  CMP notable for glucose of 194.  Rapid Covid pending.  Lactic within normal limits.  Patient is not tachycardic, hypotensive, tachypneic or hyperthermia.  No indication for code sepsis initiation.  Patient has poorly controlled DM 1, polysubstance abuse, homelessness and history of medical noncompliance/poor adherence.  Patient is a extremely high risk for deterioration and would likely progress to sepsis if not treated aggressively.  We will admit patient to hospital and continue IV antibiotics at this time  4:34 PM discussed with Dr. Arnette Schaumann hospitalist team will admit patient to hospital for follow-up on blood cultures, IV antibiotics and further evaluation and treatment.       Solon Augusta [place], [place] 03/23/20 1635    Lorre Nick, MD 03/23/20 2230

## 2020-03-23 NOTE — Progress Notes (Signed)
A consult was received from an ED physician for Vancomycin per pharmacy dosing.  The patient's profile has been reviewed for ht/wt/allergies/indication/available labs.   A one time order has been placed for Vancomycin 2gm IV x1.  Further antibiotics/pharmacy consults should be ordered by admitting physician if indicated.                       Thank you, Junita Push PharmD 03/23/2020  4:20 PM

## 2020-03-23 NOTE — ED Notes (Signed)
Pt requesting more pain medication. PA aware.  

## 2020-03-23 NOTE — H&P (Signed)
History and Physical    STANCIL DEISHER ZOX:[id number] DOB: November 16, 1985 DOA: 03/23/2020  PCP: Patient, No Pcp Per  Patient coming from: Home  I have personally briefly reviewed patient's old medical records in [facility]  Chief Complaint: Left leg pain and swelling  HPI: Mark [name] is a 34 y.o. male with medical history significant of type 1 diabetes mellitus, bipolar disorder presented to ED with complaint of left lower extremity pain and swelling.  According to patient, he noticed redness on the left lower extremity about 4 days ago.  It was initially around the ankle but this has progressed about the ankle and involving the whole lower leg and some patches on the medial side of the thigh.  The leg has been getting swollen and becoming more painful so he decided to come to the emergency department.  He denies any fever, chills, sweating, nausea, vomiting, any problem with urination or with bowel movement.  No chest pain or shortness of breath or any recent travel.  Denies any sick contacts.  Would not tell me about his Covid vaccination status.  According to the report I received from the ED physician, he is homeless however he tells me that now he is home monitor.  ED Course: Upon arrival to ED, he was hemodynamically stable except fever of 100.0.  Further labs showed leukocytosis.  Blood sugar 194.  He was diagnosed with left lower extremity cellulitis.  Patient was given vancomycin and hospital service were consulted to admit the patient.  Review of Systems: As per HPI otherwise negative.    Past Medical History:  Diagnosis Date  . Anxiety   . Bipolar disorder (HCC)   . Depression   . Diabetes mellitus without complication (HCC)   . Diabetic polyneuropathy associated with type 1 diabetes mellitus (HCC)   . Hydrocephalus (HCC)   . Kidney stones   . Seizures (HCC)     Past Surgical History:  Procedure Laterality Date  . KIDNEY STONE SURGERY       reports that he has  been smoking cigarettes. He has a 20.00 pack-year smoking history. He has never used smokeless tobacco. He reports current alcohol use. He reports current drug use. Frequency: 7.00 times per week. Drug: Marijuana.  Allergies  Allergen Reactions  . Mushroom Extract Complex Hives, Itching and Nausea And Vomiting  . Penicillins Anaphylaxis, Hives and Swelling    Has patient had a PCN reaction causing immediate rash, facial/tongue/throat swelling, SOB or lightheadedness with hypotension: Yes Has patient had a PCN reaction causing severe rash involving mucus membranes or skin necrosis: No Has patient had a PCN reaction that required hospitalization: Yes Has patient had a PCN reaction occurring within the last 10 years: Yes If all of the above answers are "NO", then may proceed with Cephalosporin use.  . Sulfa Antibiotics Anaphylaxis and Hives  . Clindamycin/Lincomycin Hives    Family History  Problem Relation Age of Onset  . Breast cancer Mother     Prior to Admission medications   Medication Sig Start Date End Date Taking? Authorizing Provider  atorvastatin (LIPITOR) 20 MG tablet Take 1 tablet (20 mg total) by mouth daily. 03/14/20  Yes Lanae Boast, MD  divalproex (DEPAKOTE) 250 MG DR tablet Take 3 tablets (750 mg total) by mouth every evening. For mood stabilization 03/18/20  Yes Nwoko, Nicole Kindred I, NP  escitalopram (LEXAPRO) 20 MG tablet Take 1 tablet (20 mg total) by mouth daily. For depression 03/19/20  Yes Sanjuana Kava,  NP  insulin aspart protamine- aspart (NOVOLOG MIX 70/30) (70-30) 100 UNIT/ML injection Inject 35 Units into the skin in the morning, at noon, and at bedtime.   Yes [provider]  levothyroxine (SYNTHROID) 100 MCG tablet Take 1 tablet (100 mcg total) by mouth daily before breakfast. 12/31/19  Yes Amin, Ankit Chirag, MD  QUEtiapine (SEROQUEL) 300 MG tablet Take 1 tablet (300 mg total) by mouth at bedtime. For mood control 03/18/20  Yes Armandina Stammer I, NP  divalproex  (DEPAKOTE) 500 MG DR tablet Take 1 tablet (500 mg total) by mouth daily. For mood stabilization 03/19/20   Nwoko, Nicole Kindred I, NP  insulin aspart (NOVOLOG) 100 UNIT/ML injection Inject 0-9 Units into the skin 4 (four) times daily -  before meals and at bedtime. Patient not taking: Reported on 03/23/2020 03/13/20   Lanae Boast, MD  insulin aspart (NOVOLOG) 100 UNIT/ML injection Inject 10 Units into the skin 3 (three) times daily with meals. Patient not taking: Reported on 03/23/2020 03/13/20   Lanae Boast, MD  insulin detemir (LEVEMIR) 100 UNIT/ML injection Inject 0.4 mLs (40 Units total) into the skin at bedtime. For diabetes management Patient not taking: Reported on 03/23/2020 03/18/20   Armandina Stammer I, NP  melatonin 3 MG TABS tablet Take 2 tablets (6 mg total) by mouth at bedtime as needed. For sleep Patient not taking: Reported on 03/23/2020 03/18/20   Armandina Stammer I, NP  nicotine polacrilex (NICORETTE) 2 MG gum Take 1 each (2 mg total) by mouth as needed. (may buy from over the counter): For smoking cessation Patient not taking: Reported on 03/23/2020 03/18/20   Armandina Stammer I, NP  polyethylene glycol (MIRALAX / GLYCOLAX) 17 g packet Take 17 g by mouth daily. (May buy from over the counter): For constipation Patient not taking: Reported on 03/23/2020 03/19/20   Armandina Stammer I, NP  pregabalin (LYRICA) 150 MG capsule Take 1 capsule (150 mg total) by mouth 2 (two) times daily. Patient not taking: Reported on 03/23/2020 12/31/19   Dimple Nanas, MD    Physical Exam: Vitals:   03/23/20 1327 03/23/20 1526 03/23/20 1557 03/23/20 1600  BP: 122/76 119/73 118/75 120/69  Pulse: 76 85 80 79  Resp: 17 18 19    Temp:      TempSrc:      SpO2: 97% 96% 100% 99%  Weight:      Height:        Constitutional: NAD, calm, comfortable Vitals:   03/23/20 1327 03/23/20 1526 03/23/20 1557 03/23/20 1600  BP: 122/76 119/73 118/75 120/69  Pulse: 76 85 80 79  Resp: 17 18 19    Temp:      TempSrc:      SpO2: 97% 96% 100% 99%    Weight:      Height:       Eyes: PERRL, lids and conjunctivae normal ENMT: Mucous membranes are dry. Posterior pharynx clear of any exudate or lesions.Normal dentition.  Neck: normal, supple, no masses, no thyromegaly Respiratory: clear to auscultation bilaterally, no wheezing, no crackles. Normal respiratory effort. No accessory muscle use.  Cardiovascular: Regular rate and rhythm, no murmurs / rubs / gallops. No extremity edema. 2+ pedal pulses. No carotid bruits.  Abdomen: no tenderness, no masses palpated. No hepatosplenomegaly. Bowel sounds positive.  Musculoskeletal: no clubbing / cyanosis. No joint deformity upper and lower extremities. Good ROM, no contractures. Normal muscle tone.  Skin: Erythema involving most of the left lower extremity and some patches of the medial side of the  thigh. Neurologic: CN 2-12 grossly intact. Sensation intact, DTR normal. Strength 5/5 in all 4.  Psychiatric: Normal judgment and insight. Alert and oriented x 3. Normal mood.        Labs on Admission: I have personally reviewed following labs and imaging studies  CBC: Recent Labs  Lab 03/16/20 1810 03/23/20 1438  WBC 8.6 15.8*  NEUTROABS 5.0 11.6*  HGB 13.0 11.4*  HCT 40.4 35.0*  MCV 91.6 91.1  PLT 272 578   Basic Metabolic Panel: Recent Labs  Lab 03/23/20 1438  NA 138  K 3.5  CL 102  CO2 25  GLUCOSE 194*  BUN 10  CREATININE 1.11  CALCIUM 8.8*   GFR: Estimated Creatinine Clearance: 123.2 mL/min (by C-G formula based on SCr of 1.11 mg/dL). Liver Function Tests: Recent Labs  Lab 03/23/20 1438  AST 18  ALT 19  ALKPHOS 88  BILITOT 0.7  PROT 7.5  ALBUMIN 3.9   No results for input(s): LIPASE, AMYLASE in the last 168 hours. No results for input(s): AMMONIA in the last 168 hours. Coagulation Profile: No results for input(s): INR, PROTIME in the last 168 hours. Cardiac Enzymes: No results for input(s): CKTOTAL, CKMB, CKMBINDEX, TROPONINI in the last 168 hours. BNP (last 3  results) No results for input(s): PROBNP in the last 8760 hours. HbA1C: No results for input(s): HGBA1C in the last 72 hours. CBG: Recent Labs  Lab 03/17/20 1730 03/17/20 2055 03/18/20 0542 03/18/20 1135 03/23/20 1444  GLUCAP 187* 198* 118* 198* 218*   Lipid Profile: No results for input(s): CHOL, HDL, LDLCALC, TRIG, CHOLHDL, LDLDIRECT in the last 72 hours. Thyroid Function Tests: No results for input(s): TSH, T4TOTAL, FREET4, T3FREE, THYROIDAB in the last 72 hours. Anemia Panel: No results for input(s): VITAMINB12, FOLATE, FERRITIN, TIBC, IRON, RETICCTPCT in the last 72 hours. Urine analysis:    Component Value Date/Time   COLORURINE STRAW (A) 03/17/2020 0645   APPEARANCEUR CLEAR 03/17/2020 0645   APPEARANCEUR Clear 01/20/2015 2136   LABSPEC 1.017 03/17/2020 0645   LABSPEC 1.023 01/20/2015 2136   PHURINE 7.0 03/17/2020 0645   GLUCOSEU >=500 (A) 03/17/2020 0645   GLUCOSEU >=500 01/20/2015 2136   HGBUR NEGATIVE 03/17/2020 0645   BILIRUBINUR NEGATIVE 03/17/2020 0645   BILIRUBINUR Negative 01/20/2015 2136   KETONESUR NEGATIVE 03/17/2020 0645   PROTEINUR NEGATIVE 03/17/2020 0645   NITRITE NEGATIVE 03/17/2020 0645   LEUKOCYTESUR NEGATIVE 03/17/2020 0645   LEUKOCYTESUR Negative 01/20/2015 2136    Radiological Exams on Admission: No results found.  Assessment/Plan Active Problems:   Type 1 diabetes mellitus without complication (HCC)   Hypothyroidism   Left leg cellulitis    Left lower extremity cellulitis: Patient does not meet any sepsis criteria as his temperature was only 100.0 and he has leukocytosis.  He has received vancomycin in the emergency department.  For cellulitis order set, I will start him on Merrem and vancomycin as he is allergic to penicillins.  Reassess tomorrow.  Recheck labs tomorrow.  Type 1 diabetes mellitus: Patient claims that he takes 70/30, 35 units 3 times daily Premeal as well as Levemir 40 units at night.  Do not know how reliable that  history is.  He has not seen any physician for several years but tells me that he gets his prescription from ER visits.  At this point in time, I will resume his 70/ 30 along with SSI but hold Levemir.  Bipolar disorder: Resume home medications.  DVT prophylaxis: Lovenox Code Status: Full code Family Communication: None present  at bedside.  Plan of care discussed with patient in length and he verbalized understanding and agreed with it. Disposition Plan: Discharge home in next 2 days Consults called: None Admission status: Inpatient   Status is: Inpatient  Remains inpatient appropriate because:Inpatient level of care appropriate due to severity of illness   Dispo: The patient is from: Home              Anticipated d/c is to: Home              Anticipated d/c date is: 2 days              Patient currently is not medically stable to d/c.        Hughie Closs MD [facility]  03/23/2020, 4:45 PM  To contact the attending provider between 7A-7P or the covering provider during after hours 7P-7A, please log into the web site [email or web address]

## 2020-03-23 NOTE — ED Notes (Signed)
Redness extending up leg marked with skin maker.
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
## 2020-03-23 NOTE — Progress Notes (Signed)
Pharmacy Antibiotic Note  Mark [name] is a 34 y.o. male admitted on 03/23/2020 with cellulitis.  Pharmacy has been consulted for Meropenem & Vancomycin dosing.  No purulence noted, WBC mildly elevated, Afebrile, CrCl>148ml/min  Plan: Meropenem 1gm IV q24h Vancomycin 1750mg  IV q12h to target AUC 400-550 Monitor renal function and cx data   Height: 6\' 2"  (188 cm) Weight: 113.4 kg (250 lb) IBW/kg (Calculated) : 82.2  Temp (24hrs), Avg:100 F (37.8 C), Min:100 F (37.8 C), Max:100 F (37.8 C)  Recent Labs  Lab 03/16/20 1810 03/23/20 1438  WBC 8.6 15.8*  CREATININE  --  1.11  LATICACIDVEN  --  1.5    Estimated Creatinine Clearance: 123.2 mL/min (by C-G formula based on SCr of 1.11 mg/dL).    Allergies  Allergen Reactions  . Mushroom Extract Complex Hives, Itching and Nausea And Vomiting  . Penicillins Anaphylaxis, Hives and Swelling    Has patient had a PCN reaction causing immediate rash, facial/tongue/throat swelling, SOB or lightheadedness with hypotension: Yes Has patient had a PCN reaction causing severe rash involving mucus membranes or skin necrosis: No Has patient had a PCN reaction that required hospitalization: Yes Has patient had a PCN reaction occurring within the last 10 years: Yes If all of the above answers are "NO", then may proceed with Cephalosporin use.  . Sulfa Antibiotics Anaphylaxis and Hives  . Clindamycin/Lincomycin Hives    Antimicrobials this admission: 6/2 Meropenem >>  6/2 Vancomycin >>   Dose adjustments this admission:  Microbiology results: 6/2 BCx:   Thank you for allowing pharmacy to be a part of this patient's care.  8/2 PharmD, BCPS 03/23/2020 5:01 PM

## 2020-03-24 DIAGNOSIS — E109 Type 1 diabetes mellitus without complications: Secondary | ICD-10-CM

## 2020-03-24 LAB — GLUCOSE, CAPILLARY
Glucose-Capillary: 31 mg/dL — CL (ref 70–99)
Glucose-Capillary: 63 mg/dL — ABNORMAL LOW (ref 70–99)
Glucose-Capillary: 69 mg/dL — ABNORMAL LOW (ref 70–99)
Glucose-Capillary: 88 mg/dL (ref 70–99)
Glucose-Capillary: 150 mg/dL — ABNORMAL HIGH (ref 70–99)
Glucose-Capillary: 224 mg/dL — ABNORMAL HIGH (ref 70–99)
Glucose-Capillary: 88 mg/dL (ref 70–99)

## 2020-03-24 LAB — COMPREHENSIVE METABOLIC PANEL
ALT: 15 U/L (ref 0–44)
AST: 16 U/L (ref 15–41)
Albumin: 2.9 g/dL — ABNORMAL LOW (ref 3.5–5.0)
Alkaline Phosphatase: 65 U/L (ref 38–126)
Anion gap: 8 (ref 5–15)
BUN: 9 mg/dL (ref 6–20)
CO2: 23 mmol/L (ref 22–32)
Calcium: 8.4 mg/dL — ABNORMAL LOW (ref 8.9–10.3)
Chloride: 112 mmol/L — ABNORMAL HIGH (ref 98–111)
Creatinine, Ser: 0.84 mg/dL (ref 0.61–1.24)
GFR calc Af Amer: >60 mL/min (ref >60)
GFR calc non Af Amer: >60 mL/min (ref >60)
Glucose, Bld: 60 mg/dL — ABNORMAL LOW (ref 70–99)
Potassium: 3.9 mmol/L (ref 3.5–5.1)
Sodium: 143 mmol/L (ref 135–145)
Total Bilirubin: 0.5 mg/dL (ref 0.3–1.2)
Total Protein: 6.1 g/dL — ABNORMAL LOW (ref 6.5–8.1)

## 2020-03-24 LAB — CBC
HCT: 34.8 % — ABNORMAL LOW (ref 39.0–52.0)
Hemoglobin: 11.4 g/dL — ABNORMAL LOW (ref 13.0–17.0)
MCH: 29.5 pg (ref 26.0–34.0)
MCHC: 32.8 g/dL (ref 30.0–36.0)
MCV: 89.9 fL (ref 80.0–100.0)
Platelets: 243 10*3/uL (ref 150–400)
RBC: 3.87 MIL/uL — ABNORMAL LOW (ref 4.22–5.81)
RDW: 14.6 % (ref 11.5–15.5)
WBC: 12.6 10*3/uL — ABNORMAL HIGH (ref 4.0–10.5)
nRBC: 0 % (ref 0.0–0.2)

## 2020-03-24 MED ORDER — DEXTROSE 50 % IV SOLN
INTRAVENOUS | Status: AC
  Administered 2020-03-24: 15 g
  Filled 2020-03-24: qty 50

## 2020-03-24 MED ORDER — SODIUM CHLORIDE 0.9 % IV SOLN
2.0000 g | INTRAVENOUS | Status: DC
  Administered 2020-03-24: 2 g via INTRAVENOUS
  Filled 2020-03-24: qty 2
  Filled 2020-03-24: qty 20

## 2020-03-24 MED ORDER — ENOXAPARIN SODIUM 40 MG/0.4ML ~~LOC~~ SOLN
40.0000 mg | SUBCUTANEOUS | Status: DC
  Administered 2020-03-24: 40 mg via SUBCUTANEOUS
  Filled 2020-03-24: qty 0.4

## 2020-03-24 MED ORDER — INSULIN DETEMIR 100 UNIT/ML ~~LOC~~ SOLN
25.0000 [IU] | Freq: Every day | SUBCUTANEOUS | Status: DC
  Administered 2020-03-24: 25 [IU] via SUBCUTANEOUS
  Filled 2020-03-24 (×2): qty 0.25

## 2020-03-24 NOTE — Progress Notes (Signed)
Inpatient Diabetes Program Recommendations  AACE/ADA: New Consensus Statement on Inpatient Glycemic Control (2015)  Target Ranges:  Prepandial:   less than 140 mg/dL      Peak postprandial:   less than 180 mg/dL (1-2 hours)      Critically ill patients:  140 - 180 mg/dL   Lab Results  Component Value Date   GLUCAP 69 (L) 03/24/2020   HGBA1C 8.8 (H) 03/23/2020    Review of Glycemic Control Results for Mark [name], Mark [name] (MRN [id number]) as of 03/24/2020 09:43  Ref. Range 03/23/2020 20:47 03/24/2020 08:24 03/24/2020 08:50 03/24/2020 09:21 03/24/2020 09:42  Glucose-Capillary Latest Ref Range: 70 - 99 mg/dL 252 (H) 31 (LL) 63 (L) 69 (L) 88   Diabetes history: DM type 1 Outpatient Diabetes medications: 70/30 35 units tid Current orders for Inpatient glycemic control:  Levemir 40 units qhs Novolog 0-15 units tid + hs Novolog 10 units tid meal coverage  Inpatient Diabetes Program Recommendations:    -  Hypoglycemia 31 this am.  -  Decrease Levemir to 30 units.  Novolog 10 units meal coverage should be held this am per parameters.  Thanks,  Christena Deem RN, MSN, BC-ADM Inpatient Diabetes Coordinator Team Pager [phone] (8a-5p)

## 2020-03-24 NOTE — Progress Notes (Signed)
Patients blood glucose 31 and was given 1/2 ampule of D50 IV and apple juice. Blood glucose rechecked 15 minutes later and was 69. MD made aware.

## 2020-03-24 NOTE — Progress Notes (Signed)
PROGRESS NOTE    Mark [name]  WER:[id number] DOB: 19-May-1986 DOA: 03/23/2020 PCP: Patient, No Pcp Per   Brief Narrative:  34 year old gentleman with a history of type 1 diabetes mellitus, bipolar disorder, noncompliance presented to ED with 4-day history of left lower extremity redness, swelling and pain.  He was diagnosed with left lower extremity cellulitis.  Hemodynamically stable upon presentation.  Afebrile.  Did not meet any sepsis criteria.  Had leukocytosis and only low-grade fever of 100.0.  He was given vancomycin in the ED.  Admitted to hospitalist service.  Started on meropenem and vancomycin.  Assessment & Plan:   Active Problems:   Type 1 diabetes mellitus without complication (HCC)   Hypothyroidism   Left leg cellulitis   Left lower extremity cellulitis: Patient does not meet any sepsis criteria as his temperature was only 100.0 and he has leukocytosis. Significant improvement in left lower extremity erythema. Continue current antibiotics. Keep overnight.  Uncontrolled type 1 diabetes mellitus with hyperglycemia: Patient claims that he takes 70/30, 35 units 3 times daily Premeal as well as Levemir 40 units at night. We only started him on Levemir and premeal 10 units regimen and not 70/30 and despite of that, he had hypoglycemia with blood sugar of 31 this morning. This indicates possible noncompliance and unreliability in the history. Will reduce his Levemir to 25 units and continue 3 times daily 10 units premeal regimen along with sliding scale insulin.   Bipolar disorder: Continue home medications.  DVT prophylaxis: Lovenox   Code Status: Full Code  Family Communication:  None present at bedside.  Plan of care discussed with patient in length and he verbalized understanding and agreed with it. Patient is from: Home Disposition Plan: Home likely tomorrow Barriers to discharge: Persistent cellulitis requiring IV antibiotics  Status is: Inpatient  Remains  inpatient appropriate because:Inpatient level of care appropriate due to severity of illness   Dispo: The patient is from: Home              Anticipated d/c is to: Home              Anticipated d/c date is: 1 day              Patient currently is not medically stable to d/c.         Estimated body mass index is 31.73 kg/m as calculated from the following:   Height as of this encounter: 6\' 2"  (1.88 m).   Weight as of this encounter: 112.1 kg.      Nutritional status:               Consultants:   None  Procedures:   None  Antimicrobials:  Anti-infectives (From admission, onward)   Start     Dose/Rate Route Frequency Ordered Stop   03/24/20 0600  vancomycin (VANCOREADY) IVPB 1750 mg/350 mL     1,750 mg 175 mL/hr over 120 Minutes Intravenous Every 12 hours 03/23/20 1719     03/23/20 2000  meropenem (MERREM) 1 g in sodium chloride 0.9 % 100 mL IVPB     1 g 200 mL/hr over 30 Minutes Intravenous Every 8 hours 03/23/20 1719     03/23/20 1645  vancomycin (VANCOCIN) IVPB 1000 mg/200 mL premix  Status:  Discontinued     1,000 mg 200 mL/hr over 60 Minutes Intravenous  Once 03/23/20 1644 03/23/20 1650   03/23/20 1645  meropenem (MERREM) 1 g in sodium chloride 0.9 % 100 mL IVPB  Status:  Discontinued     1 g 200 mL/hr over 30 Minutes Intravenous  Once 03/23/20 1644 03/23/20 1852   03/23/20 1630  vancomycin (VANCOREADY) IVPB 2000 mg/400 mL     2,000 mg 200 mL/hr over 120 Minutes Intravenous  Once 03/23/20 1620 03/23/20 1856         Subjective: Seen and examined. Complains of same left lower extremity pain with no improvement. Has remained afebrile. Significant improvement in the left lower extremity erythema.  Objective: Vitals:   03/24/20 0141 03/24/20 0142 03/24/20 0443 03/24/20 0943  BP:  102/64 110/66 (!) 146/128  Pulse:  62 60 79  Resp:  14 16 18   Temp: 98.2 F (36.8 C) 98 F (36.7 C) (!) 97.4 F (36.3 C) 97.7 F (36.5 C)  TempSrc: Oral Oral Oral  Oral  SpO2:  97% 100% 100%  Weight: 112.1 kg     Height:        Intake/Output Summary (Last 24 hours) at 03/24/2020 0950 Last data filed at 03/24/2020 0900 Gross per 24 hour  Intake 1968.9 ml  Output 1200 ml  Net 768.9 ml   Filed Weights   03/23/20 1229 03/24/20 0141  Weight: 113.4 kg 112.1 kg    Examination:  General exam: Appears calm and comfortable  Respiratory system: Clear to auscultation. Respiratory effort normal. Cardiovascular system: S1 & S2 heard, RRR. No JVD, murmurs, rubs, gallops or clicks. No pedal edema. Gastrointestinal system: Abdomen is nondistended, soft and nontender. No organomegaly or masses felt. Normal bowel sounds heard. Central nervous system: Alert and oriented. No focal neurological deficits. Extremities: Symmetric 5 x 5 power. Skin: Erythema on the left lower extremity from ankle to two third of the shin. Some scattered erythema on the medial side of thigh. Overall significant improvement. Still tender to touch but not as warm as it was yesterday. Psychiatry: Judgement and insight appear normal. Mood & affect appropriate.   Following pictures from 03-23-20     Following pictures from 03/24/20         Data Reviewed: I have personally reviewed following labs and imaging studies  CBC: Recent Labs  Lab 03/23/20 1438 03/24/20 0441  WBC 15.8* 12.6*  NEUTROABS 11.6*  --   HGB 11.4* 11.4*  HCT 35.0* 34.8*  MCV 91.1 89.9  PLT 260 811   Basic Metabolic Panel: Recent Labs  Lab 03/23/20 1438 03/24/20 0441  NA 138 143  K 3.5 3.9  CL 102 112*  CO2 25 23  GLUCOSE 194* 60*  BUN 10 9  CREATININE 1.11 0.84  CALCIUM 8.8* 8.4*   GFR: Estimated Creatinine Clearance: 162 mL/min (by C-G formula based on SCr of 0.84 mg/dL). Liver Function Tests: Recent Labs  Lab 03/23/20 1438 03/24/20 0441  AST 18 16  ALT 19 15  ALKPHOS 88 65  BILITOT 0.7 0.5  PROT 7.5 6.1*  ALBUMIN 3.9 2.9*   No results for input(s): LIPASE, AMYLASE in the last 168  hours. No results for input(s): AMMONIA in the last 168 hours. Coagulation Profile: No results for input(s): INR, PROTIME in the last 168 hours. Cardiac Enzymes: No results for input(s): CKTOTAL, CKMB, CKMBINDEX, TROPONINI in the last 168 hours. BNP (last 3 results) No results for input(s): PROBNP in the last 8760 hours. HbA1C: Recent Labs    03/23/20 1438  HGBA1C 8.8*   CBG: Recent Labs  Lab 03/23/20 2047 03/24/20 0824 03/24/20 0850 03/24/20 0921 03/24/20 0942  GLUCAP 133* 31* 63* 69* 88   Lipid Profile: No results for  input(s): CHOL, HDL, LDLCALC, TRIG, CHOLHDL, LDLDIRECT in the last 72 hours. Thyroid Function Tests: No results for input(s): TSH, T4TOTAL, FREET4, T3FREE, THYROIDAB in the last 72 hours. Anemia Panel: No results for input(s): VITAMINB12, FOLATE, FERRITIN, TIBC, IRON, RETICCTPCT in the last 72 hours. Sepsis Labs: Recent Labs  Lab 03/23/20 1438  LATICACIDVEN 1.5    Recent Results (from the past 240 hour(s))  Urine Culture     Status: Abnormal   Collection Time: 03/17/20  6:45 AM   Specimen: Urine, Random  Result Value Ref Range Status   Specimen Description   Final    URINE, RANDOM Performed at [facility], [address], [place], [place] [address]    Special Requests   Final    NONE Performed at [facility], [address], [place], [place] [address]    Culture (A)  Final    <10,000 COLONIES/mL INSIGNIFICANT GROWTH Performed at [facility], [address], [place], [place] [address]    Report Status 03/18/2020 FINAL  Final  SARS Coronavirus 2 by RT PCR (hospital order, performed in [facility] hospital lab) Nasopharyngeal Nasopharyngeal Swab     Status: None   Collection Time: 03/23/20  4:54 PM   Specimen: Nasopharyngeal Swab  Result Value Ref Range Status   SARS Coronavirus 2 NEGATIVE NEGATIVE Final    Comment: (NOTE) SARS-CoV-2 target nucleic acids are NOT DETECTED. The SARS-CoV-2 RNA is  generally detectable in upper and lower respiratory specimens during the acute phase of infection. The lowest concentration of SARS-CoV-2 viral copies this assay can detect is 250 copies / mL. A negative result does not preclude SARS-CoV-2 infection and should not be used as the sole basis for treatment or other patient management decisions.  A negative result may occur with improper specimen collection / handling, submission of specimen other than nasopharyngeal swab, presence of viral mutation(s) within the areas targeted by this assay, and inadequate number of viral copies (<250 copies / mL). A negative result must be combined with clinical observations, patient history, and epidemiological information. Fact Sheet for Patients:   BoilerBrush.com.cy Fact Sheet for Healthcare Providers: https://pope.com/ This test is not yet approved or cleared  by the [place] FDA and has been authorized for detection and/or diagnosis of SARS-CoV-2 by FDA under an Emergency Use Authorization (EUA).  This EUA will remain in effect (meaning this test can be used) for the duration of the COVID-19 declaration under Section 564(b)(1) of the Act, 21 U.S.C. section 360bbb-3(b)(1), unless the authorization is terminated or revoked sooner. Performed at [facility], [address], [place], [place] [address]       Radiology Studies: No results found.  Scheduled Meds: . atorvastatin  20 mg Oral QPM  . divalproex  500 mg Oral Daily  . divalproex  750 mg Oral QPM  . escitalopram  20 mg Oral Daily  . insulin aspart  0-15 Units Subcutaneous TID WC  . insulin aspart  0-5 Units Subcutaneous QHS  . insulin aspart  10 Units Subcutaneous TID WC  . insulin detemir  40 Units Subcutaneous QHS  . levothyroxine  100 mcg Oral Q0600  . pregabalin  150 mg Oral BID  . QUEtiapine  300 mg Oral QHS   Continuous Infusions: . sodium chloride 125  mL/hr at 03/24/20 0600  . meropenem (MERREM) IV Stopped (03/24/20 0543)  . vancomycin 1,750 mg (03/24/20 0634)     LOS: 1 day   Time spent: 61  minutes   Hughie Closs, MD [facility]  03/24/2020, 9:50 AM   To contact the attending provider between 7A-7P or the covering provider during after hours 7P-7A, please log into the web site [email or web address].

## 2020-03-25 ENCOUNTER — Inpatient Hospital Stay (HOSPITAL_COMMUNITY): Payer: Self-pay

## 2020-03-25 DIAGNOSIS — R609 Edema, unspecified: Secondary | ICD-10-CM

## 2020-03-25 DIAGNOSIS — M79609 Pain in unspecified limb: Secondary | ICD-10-CM

## 2020-03-25 LAB — CBC WITH DIFFERENTIAL/PLATELET
Abs Immature Granulocytes: 0.04 10*3/uL (ref 0.00–0.07)
Basophils Absolute: 0 10*3/uL (ref 0.0–0.1)
Basophils Relative: 0 %
Eosinophils Absolute: 0.3 10*3/uL (ref 0.0–0.5)
Eosinophils Relative: 3 %
HCT: 32 % — ABNORMAL LOW (ref 39.0–52.0)
Hemoglobin: 10.3 g/dL — ABNORMAL LOW (ref 13.0–17.0)
Immature Granulocytes: 0 %
Lymphocytes Relative: 27 %
Lymphs Abs: 2.4 10*3/uL (ref 0.7–4.0)
MCH: 29.9 pg (ref 26.0–34.0)
MCHC: 32.2 g/dL (ref 30.0–36.0)
MCV: 93 fL (ref 80.0–100.0)
Monocytes Absolute: 0.8 10*3/uL (ref 0.1–1.0)
Monocytes Relative: 9 %
Neutro Abs: 5.5 10*3/uL (ref 1.7–7.7)
Neutrophils Relative %: 61 %
Platelets: 241 10*3/uL (ref 150–400)
RBC: 3.44 MIL/uL — ABNORMAL LOW (ref 4.22–5.81)
RDW: 14.1 % (ref 11.5–15.5)
WBC: 9 10*3/uL (ref 4.0–10.5)
nRBC: 0 % (ref 0.0–0.2)

## 2020-03-25 LAB — GLUCOSE, CAPILLARY
Glucose-Capillary: 65 mg/dL — ABNORMAL LOW (ref 70–99)
Glucose-Capillary: 45 mg/dL — ABNORMAL LOW (ref 70–99)

## 2020-03-25 LAB — BASIC METABOLIC PANEL
Anion gap: 9 (ref 5–15)
BUN: 7 mg/dL (ref 6–20)
CO2: 25 mmol/L (ref 22–32)
Calcium: 8.4 mg/dL — ABNORMAL LOW (ref 8.9–10.3)
Chloride: 108 mmol/L (ref 98–111)
Creatinine, Ser: 0.75 mg/dL (ref 0.61–1.24)
GFR calc Af Amer: >60 mL/min (ref >60)
GFR calc non Af Amer: >60 mL/min (ref >60)
Glucose, Bld: 56 mg/dL — ABNORMAL LOW (ref 70–99)
Potassium: 3.9 mmol/L (ref 3.5–5.1)
Sodium: 142 mmol/L (ref 135–145)

## 2020-03-25 MED ORDER — INSULIN DETEMIR 100 UNIT/ML ~~LOC~~ SOLN: 25.0000 [IU] | Freq: Every day | SUBCUTANEOUS | 0 refills | Status: DC

## 2020-03-25 MED ORDER — OXYCODONE-ACETAMINOPHEN 5-325 MG PO TABS: 1.0000 | ORAL_TABLET | Freq: Three times a day (TID) | ORAL | 0 refills | Status: AC | PRN

## 2020-03-25 MED ORDER — CEPHALEXIN 500 MG PO CAPS: 500.0000 mg | ORAL_CAPSULE | Freq: Three times a day (TID) | ORAL | 0 refills | Status: AC

## 2020-03-25 MED FILL — OXYCODONE-APAP 5-325MG: 5-325 | 1 days supply | Qty: 5 | Fill #0

## 2020-03-25 NOTE — Discharge Summary (Addendum)
Physician Discharge Summary  Mark [name] [facility]:[id number] DOB: Dec 03, 1985 DOA: 03/23/2020  PCP: Patient, No Pcp Per  Admit date: 03/23/2020 Discharge date: 03/25/2020  Admitted From: Home Disposition: Home  Recommendations for Outpatient Follow-up:  1. Follow up with PCP in 1-2 weeks 2. Please obtain BMP/CBC in one week 3. Please follow up with your PCP on the following pending results: Unresulted Labs (From admission, onward)    Start     Ordered   03/25/20 0500  CBC with Differential/Platelet  Daily,   R     03/24/20 0748   03/25/20 0500  Basic metabolic panel  Daily,   R     03/24/20 0748           Home Health: None Equipment/Devices: None  Discharge Condition: Stable CODE STATUS: Full code Diet recommendation: Diabetic  Subjective: Seen and examined.  Still complains of left lower extremity pain demanding to prescribe him pain medications at discharge.  No other complaint.  Brief/Interim Summary: 34 year old gentleman with a history of type 1 diabetes mellitus, bipolar disorder, noncompliance, recent hospitalization to the medical floor for intentional suicidal attempt by injecting himself too much of regular insulin and then discharged to inpatient psychiatry unit for bipolar disorder from where he was discharged just 5 days ago and now presented to ED with 4-day history of left lower extremity redness, swelling and pain.  He was diagnosed with left lower extremity cellulitis.  Hemodynamically stable upon presentation.  Afebrile.  Did not meet any sepsis criteria.  Had leukocytosis and only low-grade fever of 100.0.  He was given vancomycin in the ED.  Admitted to hospitalist service.  Started on meropenem and vancomycin.  Subsequently his antibiotics were deescalated to Rocephin.  Patient had improved significantly.  His erythema has improved almost 80% however he continues to complain of left lower extremity pain and tenderness.  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  He also claimed that he has been very compliant with his antidiabetic medications and claimed that he was taking Humalog 70/30 35 units twice daily along with Levemir 45 units at night along with Humalog 10 units Premeal 3 times daily however after carefully reviewing his discharge summaries from psychiatry unit and inpatient medical floor, he was discharged on 40 units of Levemir and he was instructed to stop his 70/30 and continue premeal 10 units.  Pharmacy had reported me that he was not even taking his Levemir or Humalog.  We started him on Levemir 40 units along with premeal 10 units and SSI and he had hypoglycemia with blood sugar as low as 31.  Levemir was reduced to 25 yesterday.  His blood sugar improved but remained very labile.  Based on this, I am discharging him on reduced dose of Levemir of 25 units along with premeal 10 units.  He is a stable and he will be discharged on Keflex for 5 more days.  Due to his persistent complaint of pain, I have ordered Doppler lower extremity ultrasound to rule out DVT.  He will be discharged after is resulted.  Patient requested to prescribe him pain medications due to left lower extremity pain.  Due to his recent admission secondary to suicidal attempt, I have prescribed him only  5 tablets of Percocet 5/325 p.o. to take every 8 hours as needed.  Discharge Diagnoses:  Active Problems:   Type 1 diabetes mellitus without complication (HCC)   Hypothyroidism   Left leg cellulitis    Discharge Instructions  Discharge Instructions    Discharge patient   Complete by: As directed    Please discharge patient only after his lower extremity ultrasound is done and resulted and if that is negative for DVT.  If ultrasound is  positive for DVT then please notify me/physician attending.   Discharge disposition: 01-Home or Self Care   Discharge patient date: 03/25/2020     Allergies as of 03/25/2020      Reactions   Mushroom Extract Complex Hives, Itching, Nausea And Vomiting   Penicillins Anaphylaxis, Hives, Swelling   Has patient had a PCN reaction causing immediate rash, facial/tongue/throat swelling, SOB or lightheadedness with hypotension: Yes Has patient had a PCN reaction causing severe rash involving mucus membranes or skin necrosis: No Has patient had a PCN reaction that required hospitalization: Yes Has patient had a PCN reaction occurring within the last 10 years: Yes If all of the above answers are "NO", then may proceed with Cephalosporin use.   Sulfa Antibiotics Anaphylaxis, Hives   Clindamycin/lincomycin Hives      Medication List    STOP taking these medications   insulin aspart protamine- aspart (70-30) 100 UNIT/ML injection Commonly known as: NOVOLOG MIX 70/30     TAKE these medications   atorvastatin 20 MG tablet Commonly known as: LIPITOR Take 1 tablet (20 mg total) by mouth daily.   cephALEXin 500 MG capsule Commonly known as: KEFLEX Take 1 capsule (500 mg total) by mouth 3 (three) times daily for 5 days.   divalproex 250 MG DR tablet Commonly known as: DEPAKOTE Take 3 tablets (750 mg total) by mouth every evening. For mood stabilization   divalproex 500 MG DR tablet Commonly known as: DEPAKOTE Take 1 tablet (500 mg total) by mouth daily. For mood stabilization   escitalopram 20 MG tablet Commonly known as: LEXAPRO Take 1 tablet (20 mg total) by mouth daily. For depression   insulin aspart 100 UNIT/ML injection Commonly known as: novoLOG Inject 10 Units into the skin 3 (three) times daily with meals. What changed: Another medication with the same name was removed. Continue taking this medication, and follow the directions you see here.   insulin detemir 100 UNIT/ML  injection Commonly known as: LEVEMIR Inject 0.25 mLs (25 Units total) into the skin at bedtime. For diabetes management What changed: how much to take   levothyroxine 100 MCG tablet Commonly known as: SYNTHROID Take 1 tablet (100 mcg total) by mouth daily before breakfast.   melatonin 3 MG Tabs tablet Take 2 tablets (6 mg total) by mouth at bedtime as needed. For sleep   nicotine polacrilex 2 MG gum Commonly known as: NICORETTE Take 1 each (2 mg total) by mouth as needed. (may buy from over the counter): For smoking cessation   polyethylene glycol 17 g packet Commonly known as: MIRALAX / GLYCOLAX Take 17 g by mouth daily. (May buy from over the counter): For constipation   pregabalin 150 MG capsule Commonly known as: LYRICA Take 1 capsule (150 mg total) by mouth 2 (two) times daily.   QUEtiapine 300 MG tablet Commonly known as: SEROQUEL Take 1 tablet (300 mg total) by mouth at bedtime. For mood control       Allergies  Allergen Reactions  . Mushroom Extract  Complex Hives, Itching and Nausea And Vomiting  . Penicillins Anaphylaxis, Hives and Swelling    Has patient had a PCN reaction causing immediate rash, facial/tongue/throat swelling, SOB or lightheadedness with hypotension: Yes Has patient had a PCN reaction causing severe rash involving mucus membranes or skin necrosis: No Has patient had a PCN reaction that required hospitalization: Yes Has patient had a PCN reaction occurring within the last 10 years: Yes If all of the above answers are "NO", then may proceed with Cephalosporin use.  . Sulfa Antibiotics Anaphylaxis and Hives  . Clindamycin/Lincomycin Hives    Consultations: None   Procedures/Studies: VAS [place] LOWER EXTREMITY VENOUS (DVT)  Result Date: 03/25/2020  Lower Venous DVTStudy Indications: Edema, and Pain.  Limitations: Pain tolerance. Comparison Study: no prior Performing Technologist: Blanch Media RVS  Examination Guidelines: A complete evaluation  includes B-mode imaging, spectral Doppler, color Doppler, and power Doppler as needed of all accessible portions of each vessel. Bilateral testing is considered an integral part of a complete examination. Limited examinations for reoccurring indications may be performed as noted. The reflux portion of the exam is performed with the patient in reverse Trendelenburg.  +-----+---------------+---------+-----------+----------+--------------+ RIGHTCompressibilityPhasicitySpontaneityPropertiesThrombus Aging +-----+---------------+---------+-----------+----------+--------------+ CFV  Full           Yes      Yes                                 +-----+---------------+---------+-----------+----------+--------------+   +---------+---------------+---------+-----------+----------+--------------+ LEFT     CompressibilityPhasicitySpontaneityPropertiesThrombus Aging +---------+---------------+---------+-----------+----------+--------------+ CFV      Full           Yes      Yes                                 +---------+---------------+---------+-----------+----------+--------------+ SFJ      Full                                                        +---------+---------------+---------+-----------+----------+--------------+ FV Prox  Full                                                        +---------+---------------+---------+-----------+----------+--------------+ FV Mid                  Yes      Yes                                 +---------+---------------+---------+-----------+----------+--------------+ FV Distal               Yes      Yes                                 +---------+---------------+---------+-----------+----------+--------------+ PFV      Full                                                        +---------+---------------+---------+-----------+----------+--------------+  POP      Full           Yes      Yes                                  +---------+---------------+---------+-----------+----------+--------------+ PTV      Full                                                        +---------+---------------+---------+-----------+----------+--------------+ PERO     Full                                                        +---------+---------------+---------+-----------+----------+--------------+     Summary: RIGHT: - No evidence of common femoral vein obstruction.  LEFT: - There is no evidence of deep vein thrombosis in the lower extremity.  - No cystic structure found in the popliteal fossa.  *See table(s) above for measurements and observations.    Preliminary       Discharge Exam: Vitals:   03/25/20 0627 03/25/20 0827  BP: 130/74   Pulse: 71   Resp: 20   Temp: 97.8 F (36.6 C)   SpO2: 97% 94%   Vitals:   03/24/20 2140 03/25/20 0237 03/25/20 0627 03/25/20 0827  BP: 127/81 (!) 111/95 130/74   Pulse: 84 71 71   Resp: 16 18 20    Temp: 98.6 F (37 C) 98.1 F (36.7 C) 97.8 F (36.6 C)   TempSrc: Oral Oral Oral   SpO2: 99% 100% 97% 94%  Weight:   112.8 kg   Height:        General: Pt is alert, awake, not in acute distress Cardiovascular: RRR, S1/S2 +, no rubs, no gallops Respiratory: CTA bilaterally, no wheezing, no rhonchi Abdominal: Soft, NT, ND, bowel sounds + Extremities: Slightly edematous left lower extremity with scattered erythema which has improved significantly.  Pictures below from admission on 03/23/2020 and follow-up on 03/24/2020.  His leg is looking much better than the picture from 03/24/2020.   Following pictures from 03/23/20      Following pictures from 03/24/2020:        The results of significant diagnostics from this hospitalization (including imaging, microbiology, ancillary and laboratory) are listed below for reference.     Microbiology: Recent Results (from the past 240 hour(s))  Urine Culture     Status: Abnormal   Collection Time: 03/17/20  6:45 AM   Specimen:  Urine, Random  Result Value Ref Range Status   Specimen Description   Final    URINE, RANDOM Performed at [facility], [address] [place], [place] [address]    Special Requests   Final    NONE Performed at [facility], [address] [place], [place] [address]    Culture (A)  Final    <10,000 COLONIES/mL INSIGNIFICANT GROWTH Performed at [facility], [address] [place], [place] [address]    Report Status 03/18/2020 FINAL  Final  Blood culture (routine x 2)     Status: None (Preliminary result)   Collection Time:  03/23/20  2:38 PM   Specimen: BLOOD  Result Value Ref Range Status   Specimen Description   Final    BLOOD RIGHT ANTECUBITAL Performed at [facility] [address], [place], [place] [address]    Special Requests   Final    BOTTLES DRAWN AEROBIC AND ANAEROBIC Blood Culture results may not be optimal due to an excessive volume of blood received in culture bottles Performed at [facility], [address] [place], [place] [address]    Culture   Final    NO GROWTH < 24 HOURS Performed at [facility], [address], [place], [place] [address]    Report Status PENDING  Incomplete  Blood culture (routine x 2)     Status: None (Preliminary result)   Collection Time: 03/23/20  3:11 PM   Specimen: BLOOD  Result Value Ref Range Status   Specimen Description   Final    BLOOD RIGHT ANTECUBITAL Performed at [facility], [address] [place], [place] [address]    Special Requests   Final    BOTTLES DRAWN AEROBIC AND ANAEROBIC Blood Culture adequate volume Performed at [facility], [address] [place], [place] [address]    Culture   Final    NO GROWTH < 24 HOURS Performed at [facility], [address] [place], [place] [address]    Report Status PENDING  Incomplete  SARS Coronavirus 2 by RT PCR (hospital order,  performed in [facility] hospital lab) Nasopharyngeal Nasopharyngeal Swab     Status: None   Collection Time: 03/23/20  4:54 PM   Specimen: Nasopharyngeal Swab  Result Value Ref Range Status   SARS Coronavirus 2 NEGATIVE NEGATIVE Final    Comment: (NOTE) SARS-CoV-2 target nucleic acids are NOT DETECTED. The SARS-CoV-2 RNA is generally detectable in upper and lower respiratory specimens during the acute phase of infection. The lowest concentration of SARS-CoV-2 viral copies this assay can detect is 250 copies / mL. A negative result does not preclude SARS-CoV-2 infection and should not be used as the sole basis for treatment or other patient management decisions.  A negative result may occur with improper specimen collection / handling, submission of specimen other than nasopharyngeal swab, presence of viral mutation(s) within the areas targeted by this assay, and inadequate number of viral copies (<250 copies / mL). A negative result must be combined with clinical observations, patient history, and epidemiological information. Fact Sheet for Patients:   BoilerBrush.com.cy Fact Sheet for Healthcare Providers: https://pope.com/ This test is not yet approved or cleared  by the [place] FDA and has been authorized for detection and/or diagnosis of SARS-CoV-2 by FDA under an Emergency Use Authorization (EUA).  This EUA will remain in effect (meaning this test can be used) for the duration of the COVID-19 declaration under Section 564(b)(1) of the Act, 21 U.S.C. section 360bbb-3(b)(1), unless the authorization is terminated or revoked sooner. Performed at [facility], [address], [place], [place] [address]      Labs: BNP (last 3 results) No results for input(s): BNP in the last 8760 hours. Basic Metabolic Panel: Recent Labs  Lab 03/23/20 1438 03/24/20 0441 03/25/20 0752  NA 138 143 142  K 3.5 3.9 3.9    CL 102 112* 108  CO2 25 23 25   GLUCOSE 194* 60* 56*  BUN 10 9 7   CREATININE 1.11 0.84 0.75  CALCIUM 8.8* 8.4* 8.4*   Liver  Function Tests: Recent Labs  Lab 03/23/20 1438 03/24/20 0441  AST 18 16  ALT 19 15  ALKPHOS 88 65  BILITOT 0.7 0.5  PROT 7.5 6.1*  ALBUMIN 3.9 2.9*   No results for input(s): LIPASE, AMYLASE in the last 168 hours. No results for input(s): AMMONIA in the last 168 hours. CBC: Recent Labs  Lab 03/23/20 1438 03/24/20 0441 03/25/20 0752  WBC 15.8* 12.6* 9.0  NEUTROABS 11.6*  --  5.5  HGB 11.4* 11.4* 10.3*  HCT 35.0* 34.8* 32.0*  MCV 91.1 89.9 93.0  PLT 260 243 241   Cardiac Enzymes: No results for input(s): CKTOTAL, CKMB, CKMBINDEX, TROPONINI in the last 168 hours. BNP: Invalid input(s): POCBNP CBG: Recent Labs  Lab 03/24/20 0942 03/24/20 1142 03/24/20 1618 03/24/20 2145 03/25/20 0734  GLUCAP 88 150* 224* 88 65*   D-Dimer No results for input(s): DDIMER in the last 72 hours. Hgb A1c Recent Labs    03/23/20 1438  HGBA1C 8.8*   Lipid Profile No results for input(s): CHOL, HDL, LDLCALC, TRIG, CHOLHDL, LDLDIRECT in the last 72 hours. Thyroid function studies No results for input(s): TSH, T4TOTAL, T3FREE, THYROIDAB in the last 72 hours.  Invalid input(s): FREET3 Anemia work up No results for input(s): VITAMINB12, FOLATE, FERRITIN, TIBC, IRON, RETICCTPCT in the last 72 hours. Urinalysis    Component Value Date/Time   COLORURINE YELLOW 03/23/2020 1654   APPEARANCEUR CLEAR 03/23/2020 1654   APPEARANCEUR Clear 01/20/2015 2136   LABSPEC 1.023 03/23/2020 1654   LABSPEC 1.023 01/20/2015 2136   PHURINE 5.0 03/23/2020 1654   GLUCOSEU >=500 (A) 03/23/2020 1654   GLUCOSEU >=500 01/20/2015 2136   HGBUR NEGATIVE 03/23/2020 1654   BILIRUBINUR NEGATIVE 03/23/2020 1654   BILIRUBINUR Negative 01/20/2015 2136   KETONESUR NEGATIVE 03/23/2020 1654   PROTEINUR 100 (A) 03/23/2020 1654   NITRITE NEGATIVE 03/23/2020 1654   LEUKOCYTESUR NEGATIVE  03/23/2020 1654   LEUKOCYTESUR Negative 01/20/2015 2136   Sepsis Labs Invalid input(s): PROCALCITONIN,  WBC,  LACTICIDVEN Microbiology Recent Results (from the past 240 hour(s))  Urine Culture     Status: Abnormal   Collection Time: 03/17/20  6:45 AM   Specimen: Urine, Random  Result Value Ref Range Status   Specimen Description   Final    URINE, RANDOM Performed at [facility], [place] [address] [place], [place] [address]    Special Requests   Final    NONE Performed at [name], [place] [address], [place], [place] [address]    Culture (A)  Final    <10,000 COLONIES/mL INSIGNIFICANT GROWTH Performed at [place] [address], [place], [place] [address]    Report Status 03/18/2020 FINAL  Final  Blood culture (routine x 2)     Status: None (Preliminary result)   Collection Time: 03/23/20  2:38 PM   Specimen: BLOOD  Result Value Ref Range Status   Specimen Description   Final    BLOOD RIGHT ANTECUBITAL Performed at [place] [address] [place], [place] [address]    Special Requests   Final    BOTTLES DRAWN AEROBIC AND ANAEROBIC Blood Culture results may not be optimal due to an excessive volume of blood received in culture bottles Performed at [place] [name] [place], [place] [address]    Culture   Final    NO GROWTH < 24 HOURS Performed at [place] [address], [place], [place] [address]  Report Status PENDING  Incomplete  Blood culture (routine x 2)     Status: None (Preliminary result)   Collection Time: 03/23/20  3:11 PM   Specimen: BLOOD  Result Value Ref Range Status   Specimen Description   Final    BLOOD RIGHT ANTECUBITAL Performed at [facility], [address], [place], [place] [address]    Special Requests   Final    BOTTLES DRAWN AEROBIC AND ANAEROBIC Blood Culture adequate volume Performed at [facility], [address], [place], [place] [address]    Culture   Final    NO GROWTH < 24 HOURS Performed at [facility], [address], [place], [place] [address]    Report Status PENDING  Incomplete  SARS Coronavirus 2 by RT PCR (hospital order, performed in [facility] hospital lab) Nasopharyngeal Nasopharyngeal Swab     Status: None   Collection Time: 03/23/20  4:54 PM   Specimen: Nasopharyngeal Swab  Result Value Ref Range Status   SARS Coronavirus 2 NEGATIVE NEGATIVE Final    Comment: (NOTE) SARS-CoV-2 target nucleic acids are NOT DETECTED. The SARS-CoV-2 RNA is generally detectable in upper and lower respiratory specimens during the acute phase of infection. The lowest concentration of SARS-CoV-2 viral copies this assay can detect is 250 copies / mL. A negative result does not preclude SARS-CoV-2 infection and should not be used as the sole basis for treatment or other patient management decisions.  A negative result may occur with improper specimen collection / handling, submission of specimen other than nasopharyngeal swab, presence of viral mutation(s) within the areas targeted by this assay, and inadequate number of viral copies (<250 copies / mL). A negative result must be combined with clinical observations, patient history, and epidemiological information. Fact Sheet for Patients:   BoilerBrush.com.cy Fact Sheet for Healthcare Providers: https://pope.com/ This test is not yet approved or cleared  by the [place] FDA and has been authorized for detection and/or diagnosis of SARS-CoV-2 by FDA under an Emergency Use Authorization (EUA).  This EUA will remain in effect (meaning this test can be used) for the duration of the COVID-19 declaration under Section 564(b)(1) of the Act, 21 U.S.C. section 360bbb-3(b)(1), unless the authorization is terminated or revoked sooner. Performed at [facility], [address] [place], [place] [address]      Time coordinating discharge: Over 30 minutes  SIGNED:   Hughie Closs, MD  [facility] 03/25/2020, 10:09 AM  If 7PM-7AM, please contact night-coverage [email or web address]

## 2020-03-25 NOTE — Discharge Instructions (Signed)

## 2020-03-25 NOTE — Progress Notes (Signed)
Lower extremity venous has been completed.   Preliminary results in CV Proc.   Blanch Media 03/25/2020 9:59 AM

## 2020-03-25 NOTE — Progress Notes (Signed)
Discharge instructions discussed with patient,including where to pick up medications, verbalized agreement and understanding

## 2020-03-25 NOTE — Progress Notes (Signed)
Inpatient Diabetes Program Recommendations  AACE/ADA: New Consensus Statement on Inpatient Glycemic Control (2015)  Target Ranges:  Prepandial:   less than 140 mg/dL      Peak postprandial:   less than 180 mg/dL (1-2 hours)      Critically ill patients:  140 - 180 mg/dL   Lab Results  Component Value Date   GLUCAP 65 (L) 03/25/2020   HGBA1C 8.8 (H) 03/23/2020    Review of Glycemic Control Results for CHANNING, YEAGER (MRN [id number]) as of 03/25/2020 09:37  Ref. Range 03/24/2020 09:42 03/24/2020 11:42 03/24/2020 16:18 03/24/2020 21:45 03/25/2020 07:34  Glucose-Capillary Latest Ref Range: 70 - 99 mg/dL 88 778 (H) 242 (H) 88 65 (L)    Diabetes history: DM type 1 Outpatient Diabetes medications: 70/30 35 units tid Current orders for Inpatient glycemic control:  Levemir 25 units qhs Novolog 0-15 units tid + hs Novolog 10 units tid meal coverage  Inpatient Diabetes Program Recommendations:    -  Hypoglycemia 65 this am.  -  Decrease Levemir to 20 units.  - Decrease Novolog meal coverage to 8 units tid.   Thanks,  Christena Deem RN, MSN, BC-ADM Inpatient Diabetes Coordinator Team Pager [phone] (8a-5p)

## 2020-03-25 NOTE — TOC Initial Note (Addendum)
Transition of Care [facility]) - Initial/Assessment Note    Patient Details  Name: Mark [name] MRN: [id number] Date of Birth: 05-13-1986  Transition of Care [facility]) CM/SW Contact:    Clearance Coots, LCSW Phone Number: 03/25/2020, 3:59 PM  Clinical Narrative:                 Transportation services arranged to transport the patient home [address] [place] [address]. Patient completed ride waiver and release of liability form fax to [phone]. Patient hospital follow up scheduled for April 13, 2020 @10 :30am. Patient informed given directions to [facility]ness clinic.   Follow up: Patient went to [facility] pharmacy to pick up his medications. Patient is got there and informed pharmacy staff he could pay for his medications after informing staff to let him leave the hospital so that he could purchase his medications. Per pharmacy staff, another patient paid for his percocets and the patient left before the pharmacist could reach out to [facility] staff for assistance with his antibiotic medications and insulin. Patient has used the [facility] letter twice so far this year. CSW unable to reach the patient , no working number listed. CSW reached out to [facility] supervisor. If the patient returns to the hospital a MATCH override can be done to provide the patient medication assistance.       Expected Discharge Plan: Home/Self Care Barriers to Discharge: No Barriers Identified   Patient Goals and CMS Choice Patient states their goals for this hospitalization and ongoing recovery are:: return home   Choice offered to / list presented to : NA  Expected Discharge Plan and Services Expected Discharge Plan: Home/Self Care   Discharge Planning Services: CM Consult, Follow-up appt scheduled Post Acute Care Choice: NA Living arrangements for the past 2 months: Apartment Expected Discharge Date: 03/25/20               DME Arranged: N/A DME Agency: NA                  Prior Living  Arrangements/Services Living arrangements for the past 2 months: Apartment Lives with:: Roommate Patient language and need for interpreter reviewed:: Yes Do you feel safe going back to the place where you live?: Yes      Need for Family Participation in Patient Care: No (Comment) Care giver support system in place?: Yes (comment)   Criminal Activity/Legal Involvement Pertinent to Current Situation/Hospitalization: No - Comment as needed  Activities of Daily Living Home Assistive Devices/Equipment: CBG Meter ADL Screening (condition at time of admission) Patient's cognitive ability adequate to safely complete daily activities?: No Is the patient deaf or have difficulty hearing?: Yes Does the patient have difficulty seeing, even when wearing glasses/contacts?: No Does the patient have difficulty concentrating, remembering, or making decisions?: No Patient able to express need for assistance with ADLs?: Yes Does the patient have difficulty dressing or bathing?: No Independently performs ADLs?: Yes (appropriate for developmental age) Does the patient have difficulty walking or climbing stairs?: No Weakness of Legs: None Weakness of Arms/Hands: None  Permission Sought/Granted Permission sought to share information with : Case Manager Permission granted to share information with : Yes, Verbal Permission Granted     Permission granted to share info w AGENCY: [phone], [facility]        Emotional Assessment Appearance:: Appears stated age Attitude/Demeanor/Rapport: Gracious Affect (typically observed): Accepting Orientation: : Oriented to Self, Oriented to Place, Oriented to  Time, Oriented to  Situation Alcohol / Substance Use: Illicit Drugs Psych Involvement: No (comment)  Admission diagnosis:  Cellulitis of left lower extremity [L03.116] Left leg cellulitis [L03.116] Patient Active Problem List   Diagnosis Date Noted  . Left leg cellulitis  03/23/2020  . Bipolar I disorder, current or most recent episode depressed, in partial remission ([place])   . MDD (major depressive disorder), recurrent episode, severe ([place]) 03/13/2020  . Drug overdose, intentional ([place]) 03/05/2020  . Drug overdose 03/05/2020  . Drug abuse ([place]) 03/05/2020  . Attempted suicide ([place]) 03/05/2020  . Insulin overdose   . Hypoglycemia 02/03/2020  . Hypothyroidism 02/02/2020  . Diabetic polyneuropathy associated with type 1 diabetes mellitus ([place]) 02/02/2020  . Type 1 diabetes mellitus without complication ([place]) 00/92/3300  . DKA (diabetic ketoacidosis) ([place]) 12/29/2019  . Homelessness 12/29/2019  . Marijuana abuse 12/29/2019  . Tobacco dependence 12/29/2019  . Seizure disorder ([place]) 12/29/2019  . Suicidal ideation   . Bipolar I disorder, most recent episode depressed, severe without psychotic features ([place])   . Adjustment disorder with depressed mood 12/07/2017  . Noncompliance 10/17/2017  . Dysthymia 10/08/2017  . Personality disorder in adult [facility]) 09/26/2017  . 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 Obstructive hydrocephalus ([place]) 09/19/2017  . MDD (major depressive disorder) 08/22/2017  . Severe recurrent major depression with psychotic features ([place]) 08/19/2017  . Abdominal pain 08/18/2017  . Hyponatremia 08/18/2017  . DKA (diabetic ketoacidoses) ([place]) 11/24/2015  . Diabetic keto-acidosis ([place]) 10/23/2015   PCP:  Patient, No Pcp Per Pharmacy:   [facility] [place], [place] [address] Phone: [phone] Fax: [place], [place] [address] [place] [address] Phone: [phone] Fax: [place], [place] [address] Phone: [phone] Fax: [phone]     Social Determinants of Health (SDOH)  Interventions    Readmission Risk Interventions Readmission Risk Prevention Plan 03/25/2020  Transportation Screening Complete  Medication Review ([place]) Complete  PCP or Specialist appointment within 3-5 days of discharge Complete  HRI or [place] (No Data)  SW Recovery Care/Counseling Consult Complete  Palliative Care Screening Not [place] Not Applicable  Some recent data might be hidden

## 2020-03-26 ENCOUNTER — Other Ambulatory Visit: Payer: Self-pay

## 2020-03-26 ENCOUNTER — Encounter (HOSPITAL_COMMUNITY): Payer: Self-pay | Admitting: Emergency Medicine

## 2020-03-26 ENCOUNTER — Emergency Department (HOSPITAL_COMMUNITY)
Admission: EM | Admit: 2020-03-26 | Discharge: 2020-03-27 | Disposition: A | Discharge status: Home / Self Care | Payer: Self-pay | Attending: Emergency Medicine | Admitting: Emergency Medicine

## 2020-03-26 DIAGNOSIS — M79606 Pain in leg, unspecified: Secondary | ICD-10-CM | POA: Insufficient documentation

## 2020-03-26 DIAGNOSIS — Z5321 Procedure and treatment not carried out due to patient leaving prior to being seen by health care provider: Secondary | ICD-10-CM | POA: Insufficient documentation

## 2020-03-26 NOTE — ED Triage Notes (Addendum)
Patient seen and admitted for same recently, discharged yesterday afternoon. Patient complaining of cellulitis and leg pain.   150/100 Palp 128 80 Hr 20 RR  97%  BG 82 9/10 throb, burn, stab

## 2020-03-27 NOTE — ED Notes (Signed)
Patient upset about not getting a room. Patient stated, "I came by ambulance and everything, my leg is killing me." Informed patient that we see people by acuity, and at the time we did not have any rooms currently available. Patient states, "I'm going, this is bull shit." Patient began walking towards the bathroom. This Clinical research associate asked patient is he was staying to be seen or leaving, which patient ignored.

## 2020-03-28 ENCOUNTER — Other Ambulatory Visit: Payer: Self-pay

## 2020-03-28 ENCOUNTER — Emergency Department (HOSPITAL_COMMUNITY): Payer: Self-pay

## 2020-03-28 ENCOUNTER — Encounter (HOSPITAL_COMMUNITY): Payer: Self-pay | Admitting: Emergency Medicine

## 2020-03-28 ENCOUNTER — Emergency Department (HOSPITAL_COMMUNITY)
Admission: EM | Admit: 2020-03-28 | Discharge: 2020-03-29 | Disposition: A | Discharge status: Home / Self Care | Payer: Self-pay | Attending: Emergency Medicine | Admitting: Emergency Medicine

## 2020-03-28 DIAGNOSIS — Z5321 Procedure and treatment not carried out due to patient leaving prior to being seen by health care provider: Secondary | ICD-10-CM | POA: Insufficient documentation

## 2020-03-28 DIAGNOSIS — L989 Disorder of the skin and subcutaneous tissue, unspecified: Secondary | ICD-10-CM | POA: Insufficient documentation

## 2020-03-28 LAB — CBC
HCT: 32.5 % — ABNORMAL LOW (ref 39.0–52.0)
Hemoglobin: 10.5 g/dL — ABNORMAL LOW (ref 13.0–17.0)
MCH: 29.6 pg (ref 26.0–34.0)
MCHC: 32.3 g/dL (ref 30.0–36.0)
MCV: 91.5 fL (ref 80.0–100.0)
Platelets: 360 10*3/uL (ref 150–400)
RBC: 3.55 MIL/uL — ABNORMAL LOW (ref 4.22–5.81)
RDW: 14.4 % (ref 11.5–15.5)
WBC: 10.1 10*3/uL (ref 4.0–10.5)
nRBC: 0 % (ref 0.0–0.2)

## 2020-03-28 LAB — URINALYSIS, ROUTINE W REFLEX MICROSCOPIC
Bacteria, UA: NONE SEEN
Bilirubin Urine: NEGATIVE
Glucose, UA: >=500 mg/dL — AB
Ketones, ur: 20 mg/dL — AB
Leukocytes,Ua: NEGATIVE
Nitrite: NEGATIVE
Protein, ur: NEGATIVE mg/dL
Specific Gravity, Urine: 1.022 (ref 1.005–1.030)
pH: 6 (ref 5.0–8.0)

## 2020-03-28 LAB — BASIC METABOLIC PANEL
Anion gap: 11 (ref 5–15)
BUN: 11 mg/dL (ref 6–20)
CO2: 22 mmol/L (ref 22–32)
Calcium: 8.6 mg/dL — ABNORMAL LOW (ref 8.9–10.3)
Chloride: 105 mmol/L (ref 98–111)
Creatinine, Ser: 0.93 mg/dL (ref 0.61–1.24)
GFR calc Af Amer: >60 mL/min (ref >60)
GFR calc non Af Amer: >60 mL/min (ref >60)
Glucose, Bld: 323 mg/dL — ABNORMAL HIGH (ref 70–99)
Potassium: 3.9 mmol/L (ref 3.5–5.1)
Sodium: 138 mmol/L (ref 135–145)

## 2020-03-28 LAB — GLUCOSE, CAPILLARY: Glucose-Capillary: 304 mg/dL — ABNORMAL HIGH (ref 70–99)

## 2020-03-28 LAB — CULTURE, BLOOD (ROUTINE X 2)
Culture: NO GROWTH
Culture: NO GROWTH
Special Requests: ADEQUATE

## 2020-03-28 LAB — LACTIC ACID, PLASMA: Lactic Acid, Venous: 1 mmol/L (ref 0.5–1.9)

## 2020-03-28 NOTE — ED Triage Notes (Signed)
Patient came from home by [facility]. Patient left leg redness and reddened. Patient did not get his medication and is diabetic.

## 2020-03-28 NOTE — Progress Notes (Addendum)
CSW spoke to the RN CM who worked with the pt on 03/25/20 and states that the pt was provided with a MATCH letter to get his antibiotics and another medicine for free and also was prescribed percocet for pain.  The match letter (which provided the pt with a "one-time" free medication letter for non-narcotics) was sent to [name] on 6/4 along with a prescription for percocet.  Per the RN CM and the [facility] Inpatient CSW who helped the pt on 6/4 prior to D/C, a call was placed to the [facility] to insure the pt picked up his meds and the RN CM/CSW were told the pt intentionally picked up the percocet which cost the pt $9.34, but the pt chose not to pick up the free antibiotics and the accompanying free med provided to him and instead left only with the percocet, per the Pharmacy Tech that the RN CM/CSW spoke to on 6/4.  Per the CSW consult, now the pt is back and will have to upon D/C return to the [facility] on 6/8 when it reopens to pick up his two free meds (one of which is the antibiotic) once the pharmacy reopens on 03/29/20, per the RN CM which will allow the pt to utilize the one-time use MATCH letter that was sent to the pharmacy on 6/4.  8:21 PM CSW went to the pt in the ED waiting room and counseled him he will have to go to [facility] when it opens on 6/8 and pick up his meds, which will be free, provided pt with tow bus passes and pt voiced understanding, was appreciative and thanked the CSW.  Pt aware he is still on the traige waiting list to be checked out.  Please reconsult if future social work needs arise.  CSW signing off, as social work intervention is no longer needed.  Dorothe Pea. Alitzel Cookson  MSW, LCSW, LCAS, CCS Transitions of Care Clinical Social Worker Care Coordination Department Ph:  [phone]

## 2020-03-28 NOTE — ED Notes (Signed)
Pt has 1 (one) set of BC in main lab if needed Blue top from RIGHT HAND Red top from LEFT FOREARM

## 2020-03-28 NOTE — ED Notes (Signed)
Call received from [name] [phone] requesting rtn call from pt re: transportation/calling for cab when ready to be d/c. Pt not answering in ED lobby. [employer]

## 2020-03-28 NOTE — ED Notes (Signed)
Called pt from lobby for rooming No answer x1

## 2020-03-29 NOTE — Progress Notes (Signed)
  MATCH Medication Assistance Card  Name: [name] (MRN): [id number]  Bin: [id number] RX Group: [id number]  Discharge Date:03/29/2020 12:30 AM  Expiration Date:04/06/2020  (must be filled within 7 days of discharge)     Dear Mark [name]:  You have been approved to have the prescriptions written by your discharging physician filled through our [facility] (Medication Assistance Through [facility]) program. This program allows for a one-time (no refills) 34-day supply of selected medications for a low copay amount.  The copay is $3.00 per prescription. For instance, if you have one prescription, you will pay $3.00; for two prescriptions, you pay $6.00; for three prescriptions, you pay $9.00; and so on.  Only certain pharmacies are participating in this program with [facility]. You will need to select one of the pharmacies from the attached list and take your prescriptions, this letter, and your photo ID to one of the participating pharmacies.   We are excited that you are able to use the [facility] program to get your medications. These prescriptions must be filled within 7 days of hospital discharge or they will no longer be valid for the [facility] program. Should you have any problems with your prescriptions please contact your case management team member at [phone] or [phone]  for [place].   Thank you,    [facility]

## 2020-03-29 NOTE — Progress Notes (Signed)
TOC CM faxed MATCH letter with $0 copay to [facility]. Pt will be able to pick up his meds today. Isidoro Donning RN CCM, [facility] ED TOC CM [phone]

## 2020-04-04 ENCOUNTER — Encounter (HOSPITAL_COMMUNITY): Payer: Self-pay | Admitting: Emergency Medicine

## 2020-04-04 ENCOUNTER — Other Ambulatory Visit: Payer: Self-pay

## 2020-04-04 ENCOUNTER — Emergency Department (HOSPITAL_COMMUNITY)
Admission: EM | Admit: 2020-04-04 | Discharge: 2020-04-04 | Disposition: A | Discharge status: Home / Self Care | Payer: Self-pay | Attending: Emergency Medicine | Admitting: Emergency Medicine

## 2020-04-04 DIAGNOSIS — E1042 Type 1 diabetes mellitus with diabetic polyneuropathy: Secondary | ICD-10-CM | POA: Insufficient documentation

## 2020-04-04 DIAGNOSIS — Z882 Allergy status to sulfonamides status: Secondary | ICD-10-CM | POA: Insufficient documentation

## 2020-04-04 DIAGNOSIS — E162 Hypoglycemia, unspecified: Secondary | ICD-10-CM

## 2020-04-04 DIAGNOSIS — Z794 Long term (current) use of insulin: Secondary | ICD-10-CM | POA: Insufficient documentation

## 2020-04-04 DIAGNOSIS — F17210 Nicotine dependence, cigarettes, uncomplicated: Secondary | ICD-10-CM | POA: Insufficient documentation

## 2020-04-04 DIAGNOSIS — Z880 Allergy status to penicillin: Secondary | ICD-10-CM | POA: Insufficient documentation

## 2020-04-04 LAB — CBC WITH DIFFERENTIAL/PLATELET
Abs Immature Granulocytes: 0.03 10*3/uL (ref 0.00–0.07)
Basophils Absolute: 0 10*3/uL (ref 0.0–0.1)
Basophils Relative: 0 %
Eosinophils Absolute: 0.2 10*3/uL (ref 0.0–0.5)
Eosinophils Relative: 3 %
HCT: 34.7 % — ABNORMAL LOW (ref 39.0–52.0)
Hemoglobin: 11.2 g/dL — ABNORMAL LOW (ref 13.0–17.0)
Immature Granulocytes: 0 %
Lymphocytes Relative: 19 %
Lymphs Abs: 1.3 10*3/uL (ref 0.7–4.0)
MCH: 30 pg (ref 26.0–34.0)
MCHC: 32.3 g/dL (ref 30.0–36.0)
MCV: 93 fL (ref 80.0–100.0)
Monocytes Absolute: 0.5 10*3/uL (ref 0.1–1.0)
Monocytes Relative: 7 %
Neutro Abs: 4.9 10*3/uL (ref 1.7–7.7)
Neutrophils Relative %: 71 %
Platelets: 325 10*3/uL (ref 150–400)
RBC: 3.73 MIL/uL — ABNORMAL LOW (ref 4.22–5.81)
RDW: 14.1 % (ref 11.5–15.5)
WBC: 6.9 10*3/uL (ref 4.0–10.5)
nRBC: 0 % (ref 0.0–0.2)

## 2020-04-04 LAB — URINALYSIS, ROUTINE W REFLEX MICROSCOPIC
Bilirubin Urine: NEGATIVE
Glucose, UA: NEGATIVE mg/dL
Hgb urine dipstick: NEGATIVE
Ketones, ur: NEGATIVE mg/dL
Leukocytes,Ua: NEGATIVE
Nitrite: NEGATIVE
Protein, ur: NEGATIVE mg/dL
Specific Gravity, Urine: 1.016 (ref 1.005–1.030)
pH: 5 (ref 5.0–8.0)

## 2020-04-04 LAB — BASIC METABOLIC PANEL
Anion gap: 11 (ref 5–15)
BUN: 6 mg/dL (ref 6–20)
CO2: 24 mmol/L (ref 22–32)
Calcium: 8.4 mg/dL — ABNORMAL LOW (ref 8.9–10.3)
Chloride: 105 mmol/L (ref 98–111)
Creatinine, Ser: 0.7 mg/dL (ref 0.61–1.24)
GFR calc Af Amer: >60 mL/min (ref >60)
GFR calc non Af Amer: >60 mL/min (ref >60)
Glucose, Bld: 175 mg/dL — ABNORMAL HIGH (ref 70–99)
Potassium: 3.8 mmol/L (ref 3.5–5.1)
Sodium: 140 mmol/L (ref 135–145)

## 2020-04-04 LAB — CBG MONITORING, ED
Glucose-Capillary: 46 mg/dL — ABNORMAL LOW (ref 70–99)
Glucose-Capillary: 93 mg/dL (ref 70–99)
Glucose-Capillary: 135 mg/dL — ABNORMAL HIGH (ref 70–99)
Glucose-Capillary: 100 mg/dL — ABNORMAL HIGH (ref 70–99)
Glucose-Capillary: 69 mg/dL — ABNORMAL LOW (ref 70–99)

## 2020-04-04 MED ORDER — DEXTROSE 50 % IV SOLN
50.0000 mL | Freq: Once | INTRAVENOUS | Status: AC | PRN
  Administered 2020-04-04: 50 mL via INTRAVENOUS
  Filled 2020-04-04: qty 50

## 2020-04-04 NOTE — ED Provider Notes (Signed)
[facility]-EMERGENCY DEPT Provider Note: Mark Dell, MD, FACEP  CSN: [id number] MRN: [id number] ARRIVAL: 04/04/20 at 0327 ROOM: RESA/RESA   CHIEF COMPLAINT  Hypoglycemia   HISTORY OF PRESENT ILLNESS  04/04/20 5:05 AM Mark [name] is a 34 y.o. male with diabetes.  He is here after a witnessed generalized tonic-clonic seizure.  EMS was called by his roommate and they found his blood sugar to be 37.  He was given glucagon and oral glucose with a return to a normal level of consciousness.  The patient states he has been taking his insulin but admits that he has not been eating as well as he should.  He has some residual edema of the left lower leg after being treated for cellulitis several weeks ago.  The erythema and pain have resolved.   Past Medical History:  Diagnosis Date  . Anxiety   . Bipolar disorder (HCC)   . Depression   . Diabetes mellitus without complication (HCC)   . Diabetic polyneuropathy associated with type 1 diabetes mellitus (HCC)   . Hydrocephalus (HCC)   . Kidney stones   . Seizures (HCC)     Past Surgical History:  Procedure Laterality Date  . KIDNEY STONE SURGERY      Family History  Problem Relation Age of Onset  . Breast cancer Mother     Social History   Tobacco Use  . Smoking status: Current Every Day Smoker    Packs/day: 1.00    Years: 20.00    Pack years: 20.00    Types: Cigarettes  . Smokeless tobacco: Never Used  Vaping Use  . Vaping Use: Never used  Substance Use Topics  . Alcohol use: Yes    Comment: occassional   . Drug use: Yes    Frequency: 7.0 times per week    Types: Marijuana    Comment: smoked 2-3 days ago, usually smokes daily    Prior to Admission medications   Medication Sig Start Date End Date Taking? Authorizing Provider  atorvastatin (LIPITOR) 20 MG tablet Take 1 tablet (20 mg total) by mouth daily. 03/14/20  Yes Lanae Boast, MD  divalproex (DEPAKOTE) 250 MG DR tablet Take 3 tablets (750 mg total) by mouth every  evening. For mood stabilization 03/18/20  Yes Nwoko, Nicole Kindred I, NP  insulin detemir (LEVEMIR) 100 UNIT/ML injection Inject 0.25 mLs (25 Units total) into the skin at bedtime. For diabetes management Patient taking differently: Inject 32 Units into the skin at bedtime. For diabetes management 03/25/20  Yes Hughie Closs, MD  QUEtiapine (SEROQUEL) 300 MG tablet Take 1 tablet (300 mg total) by mouth at bedtime. For mood control 03/18/20  Yes Armandina Stammer I, NP  divalproex (DEPAKOTE) 500 MG DR tablet Take 1 tablet (500 mg total) by mouth daily. For mood stabilization 03/19/20   Armandina Stammer I, NP  escitalopram (LEXAPRO) 20 MG tablet Take 1 tablet (20 mg total) by mouth daily. For depression 03/19/20   Armandina Stammer I, NP  levothyroxine (SYNTHROID) 100 MCG tablet Take 1 tablet (100 mcg total) by mouth daily before breakfast. 12/31/19   Amin, Loura Halt, MD  insulin aspart (NOVOLOG) 100 UNIT/ML injection Inject 10 Units into the skin 3 (three) times daily with meals. Patient not taking: Reported on 03/23/2020 03/13/20 04/04/20  Lanae Boast, MD  pregabalin (LYRICA) 150 MG capsule Take 1 capsule (150 mg total) by mouth 2 (two) times daily. Patient not taking: Reported on 03/23/2020 12/31/19 04/04/20  Dimple Nanas, MD  Allergies Mushroom extract complex, Penicillins, Sulfa antibiotics, and Clindamycin/lincomycin   REVIEW OF SYSTEMS  Negative except as noted here or in the History of Present Illness.   PHYSICAL EXAMINATION  Initial Vital Signs Blood pressure (!) 154/94, pulse 67, temperature (!) 97.4 F (36.3 C), temperature source Oral, resp. rate 16, height 6\' 2"  (1.88 m), weight 113.4 kg, SpO2 100 %.  Examination General: Well-developed, well-nourished male in no acute distress; appearance consistent with age of record HENT: normocephalic; atraumatic; widespread dental decay Eyes: pupils equal, round and reactive to light; extraocular muscles intact Neck: supple Heart: regular rate and rhythm Lungs:  clear to auscultation bilaterally Abdomen: soft; nondistended; nontender; bowel sounds present Extremities: No deformity; full range of motion; pulses normal; trace edema left lower leg Neurologic: Awake, alert and oriented; motor function intact in all extremities and symmetric; no facial droop Skin: Warm and dry Psychiatric: Flat affect; denies SI   RESULTS  Summary of this visit's results, reviewed and interpreted by myself:   EKG Interpretation  Date/Time:    Ventricular Rate:    PR Interval:    QRS Duration:   QT Interval:    QTC Calculation:   R Axis:     Text Interpretation:        Laboratory Studies: Results for orders placed or performed during the hospital encounter of 04/04/20 (from the past 24 hour(s))  CBG monitoring, ED     Status: Abnormal   Collection Time: 04/04/20  3:32 AM  Result Value Ref Range   Glucose-Capillary 46 (L) 70 - 99 mg/dL  CBC with Differential/Platelet     Status: Abnormal   Collection Time: 04/04/20  3:54 AM  Result Value Ref Range   WBC 6.9 4.0 - 10.5 K/uL   RBC 3.73 (L) 4.22 - 5.81 MIL/uL   Hemoglobin 11.2 (L) 13.0 - 17.0 g/dL   HCT 34.7 (L) 39 - 52 %   MCV 93.0 80.0 - 100.0 fL   MCH 30.0 26.0 - 34.0 pg   MCHC 32.3 30.0 - 36.0 g/dL   RDW 14.1 11.5 - 15.5 %   Platelets 325 150 - 400 K/uL   nRBC 0.0 0.0 - 0.2 %   Neutrophils Relative % 71 %   Neutro Abs 4.9 1.7 - 7.7 K/uL   Lymphocytes Relative 19 %   Lymphs Abs 1.3 0.7 - 4.0 K/uL   Monocytes Relative 7 %   Monocytes Absolute 0.5 0 - 1 K/uL   Eosinophils Relative 3 %   Eosinophils Absolute 0.2 0 - 0 K/uL   Basophils Relative 0 %   Basophils Absolute 0.0 0 - 0 K/uL   Immature Granulocytes 0 %   Abs Immature Granulocytes 0.03 0.00 - 0.07 K/uL  CBG monitoring, ED     Status: None   Collection Time: 04/04/20  3:55 AM  Result Value Ref Range   Glucose-Capillary 93 70 - 99 mg/dL  Urinalysis, Routine w reflex microscopic     Status: None   Collection Time: 04/04/20  4:06 AM    Result Value Ref Range   Color, Urine YELLOW YELLOW   APPearance CLEAR CLEAR   Specific Gravity, Urine 1.016 1.005 - 1.030   pH 5.0 5.0 - 8.0   Glucose, UA NEGATIVE NEGATIVE mg/dL   Hgb urine dipstick NEGATIVE NEGATIVE   Bilirubin Urine NEGATIVE NEGATIVE   Ketones, ur NEGATIVE NEGATIVE mg/dL   Protein, ur NEGATIVE NEGATIVE mg/dL   Nitrite NEGATIVE NEGATIVE   Leukocytes,Ua NEGATIVE NEGATIVE  CBG monitoring, ED  Status: Abnormal   Collection Time: 04/04/20  4:45 AM  Result Value Ref Range   Glucose-Capillary 135 (H) 70 - 99 mg/dL  CBG monitoring, ED     Status: Abnormal   Collection Time: 04/04/20  5:40 AM  Result Value Ref Range   Glucose-Capillary 100 (H) 70 - 99 mg/dL  CBG monitoring, ED     Status: Abnormal   Collection Time: 04/04/20  6:34 AM  Result Value Ref Range   Glucose-Capillary 69 (L) 70 - 99 mg/dL   Imaging Studies: No results found.  ED COURSE and MDM  Nursing notes, initial and subsequent vitals signs, including pulse oximetry, reviewed and interpreted by myself.  Vitals:   04/04/20 0335 04/04/20 0336 04/04/20 0340 04/04/20 0537  BP:   (!) 154/94 125/77  Pulse:   67 70  Resp:   16 14  Temp:   (!) 97.4 F (36.3 C)   TempSrc:   Oral   SpO2:   100% 99%  Weight:  113.4 kg    Height: 6\' 2"  (1.88 m)      Medications  dextrose 50 % solution 50 mL (50 mLs Intravenous Given 04/04/20 0647)   5:10 AM The patient has consumed 6 glasses of orange juice although he declines solid food.  His latest glucose that 4:47 AM was 135.  He remains awake and alert.   6:46 AM The patient is reticent to drink orange juice or eat anything stating he is falling does not want anything.  Patient given D50 1 amp IV.  Past records indicate he has a history of frequent admissions for suicide attempts by overdosing on insulin.  He denies that this was the case on this occasion.   7:02 AM Patient states he is ready to go at this time.  He again denies any suicidal attempt or  intention.  He was advised to eat regular food and to be careful when taking insulin if he is not eating an adequate diet.   PROCEDURES  Procedures   ED DIAGNOSES     ICD-10-CM   1. Hypoglycemia  E16.2        04/06/20, MD 04/04/20 [phone]

## 2020-04-04 NOTE — ED Triage Notes (Signed)
As Per EMS roommate called . Pt BG was 37. Pt was given glucagon and PO glucose. Pt is alert.

## 2020-04-04 NOTE — ED Notes (Signed)
Pt given 16 fluid ounces of orange juice between 1st and 2nd CBG. OJ is by bedside. Pt did not want anything to eat.

## 2020-04-04 NOTE — ED Notes (Addendum)
Pt successfully imbibed 16 fluid ounces of OJ. (2 white cups)

## 2020-04-09 ENCOUNTER — Inpatient Hospital Stay (HOSPITAL_COMMUNITY)
Admission: EM | Admit: 2020-04-09 | Discharge: 2020-04-12 | DRG: 638 | Disposition: A | Discharge status: Home / Self Care | Payer: Medicaid Other | Attending: Internal Medicine | Admitting: Internal Medicine

## 2020-04-09 ENCOUNTER — Encounter (HOSPITAL_COMMUNITY): Payer: Self-pay | Admitting: Emergency Medicine

## 2020-04-09 ENCOUNTER — Other Ambulatory Visit: Payer: Self-pay

## 2020-04-09 DIAGNOSIS — Z915 Personal history of self-harm: Secondary | ICD-10-CM

## 2020-04-09 DIAGNOSIS — F3175 Bipolar disorder, in partial remission, most recent episode depressed: Secondary | ICD-10-CM | POA: Diagnosis present

## 2020-04-09 DIAGNOSIS — Z6832 Body mass index (BMI) 32.0-32.9, adult: Secondary | ICD-10-CM

## 2020-04-09 DIAGNOSIS — Z590 Homelessness: Secondary | ICD-10-CM

## 2020-04-09 DIAGNOSIS — Z881 Allergy status to other antibiotic agents status: Secondary | ICD-10-CM

## 2020-04-09 DIAGNOSIS — Z79899 Other long term (current) drug therapy: Secondary | ICD-10-CM

## 2020-04-09 DIAGNOSIS — E1065 Type 1 diabetes mellitus with hyperglycemia: Principal | ICD-10-CM | POA: Diagnosis present

## 2020-04-09 DIAGNOSIS — Z794 Long term (current) use of insulin: Secondary | ICD-10-CM

## 2020-04-09 DIAGNOSIS — Z803 Family history of malignant neoplasm of breast: Secondary | ICD-10-CM

## 2020-04-09 DIAGNOSIS — E875 Hyperkalemia: Secondary | ICD-10-CM | POA: Diagnosis present

## 2020-04-09 DIAGNOSIS — F319 Bipolar disorder, unspecified: Secondary | ICD-10-CM | POA: Diagnosis present

## 2020-04-09 DIAGNOSIS — F17210 Nicotine dependence, cigarettes, uncomplicated: Secondary | ICD-10-CM | POA: Diagnosis present

## 2020-04-09 DIAGNOSIS — T383X6A Underdosing of insulin and oral hypoglycemic [antidiabetic] drugs, initial encounter: Secondary | ICD-10-CM | POA: Diagnosis present

## 2020-04-09 DIAGNOSIS — R569 Unspecified convulsions: Secondary | ICD-10-CM | POA: Diagnosis present

## 2020-04-09 DIAGNOSIS — F419 Anxiety disorder, unspecified: Secondary | ICD-10-CM | POA: Diagnosis present

## 2020-04-09 DIAGNOSIS — R739 Hyperglycemia, unspecified: Secondary | ICD-10-CM

## 2020-04-09 DIAGNOSIS — L03116 Cellulitis of left lower limb: Secondary | ICD-10-CM | POA: Diagnosis present

## 2020-04-09 DIAGNOSIS — Z882 Allergy status to sulfonamides status: Secondary | ICD-10-CM

## 2020-04-09 DIAGNOSIS — Z7289 Other problems related to lifestyle: Secondary | ICD-10-CM

## 2020-04-09 DIAGNOSIS — E870 Hyperosmolality and hypernatremia: Secondary | ICD-10-CM | POA: Diagnosis present

## 2020-04-09 DIAGNOSIS — Z79890 Hormone replacement therapy (postmenopausal): Secondary | ICD-10-CM

## 2020-04-09 DIAGNOSIS — E10649 Type 1 diabetes mellitus with hypoglycemia without coma: Secondary | ICD-10-CM | POA: Diagnosis not present

## 2020-04-09 DIAGNOSIS — N179 Acute kidney failure, unspecified: Secondary | ICD-10-CM | POA: Diagnosis present

## 2020-04-09 DIAGNOSIS — Z880 Allergy status to penicillin: Secondary | ICD-10-CM

## 2020-04-09 DIAGNOSIS — R7989 Other specified abnormal findings of blood chemistry: Secondary | ICD-10-CM | POA: Diagnosis present

## 2020-04-09 DIAGNOSIS — T502X5A Adverse effect of carbonic-anhydrase inhibitors, benzothiadiazides and other diuretics, initial encounter: Secondary | ICD-10-CM | POA: Diagnosis present

## 2020-04-09 DIAGNOSIS — Z91018 Allergy to other foods: Secondary | ICD-10-CM

## 2020-04-09 DIAGNOSIS — E669 Obesity, unspecified: Secondary | ICD-10-CM | POA: Diagnosis present

## 2020-04-09 DIAGNOSIS — Z20822 Contact with and (suspected) exposure to covid-19: Secondary | ICD-10-CM | POA: Diagnosis present

## 2020-04-09 DIAGNOSIS — F1290 Cannabis use, unspecified, uncomplicated: Secondary | ICD-10-CM | POA: Diagnosis present

## 2020-04-09 DIAGNOSIS — E039 Hypothyroidism, unspecified: Secondary | ICD-10-CM | POA: Diagnosis present

## 2020-04-09 DIAGNOSIS — E1042 Type 1 diabetes mellitus with diabetic polyneuropathy: Secondary | ICD-10-CM | POA: Diagnosis present

## 2020-04-09 LAB — CBC
HCT: 40.8 % (ref 39.0–52.0)
Hemoglobin: 13 g/dL (ref 13.0–17.0)
MCH: 29.9 pg (ref 26.0–34.0)
MCHC: 31.9 g/dL (ref 30.0–36.0)
MCV: 93.8 fL (ref 80.0–100.0)
Platelets: 324 10*3/uL (ref 150–400)
RBC: 4.35 MIL/uL (ref 4.22–5.81)
RDW: 13.6 % (ref 11.5–15.5)
WBC: 8.2 10*3/uL (ref 4.0–10.5)
nRBC: 0 % (ref 0.0–0.2)

## 2020-04-09 LAB — CBG MONITORING, ED: Glucose-Capillary: >600 mg/dL (ref 70–99)

## 2020-04-09 MED ORDER — SODIUM CHLORIDE 0.9 % IV BOLUS
1000.0000 mL | Freq: Once | INTRAVENOUS | Status: AC
  Administered 2020-04-09: 1000 mL via INTRAVENOUS

## 2020-04-09 MED ORDER — ONDANSETRON HCL 4 MG/2ML IJ SOLN
4.0000 mg | Freq: Once | INTRAMUSCULAR | Status: AC
  Administered 2020-04-09: 4 mg via INTRAVENOUS
  Filled 2020-04-09: qty 2

## 2020-04-09 NOTE — ED Provider Notes (Signed)
[facility]-EMERGENCY DEPT Provider Note   CSN: [id number] Arrival date & time: 04/09/20  2140     History Chief Complaint  Patient presents with  . Hyperglycemia    Mark [name] is a 34 y.o. male presenting for evaluation of hyperglycemia.  Patient states he ran out of his insulin last night.  As such, his blood sugar today has been elevated.  He reports associated polydipsia, polyuria, nausea, one episode of emesis, and worsening neuropathy.  He states he cannot afford his medicine until July 1, in 10 days.  He states he does not know why his supply ran out early this month. He denies excessive insulin use.  He denies recent fevers, chills, chest pain, shortness of breath, abdominal pain, abnormal bowel movements.  He reports no other medical problems besides diabetes.  Additional history obtained from chart review.  Patient with a history of diabetes, depression, bipolar, homelessness.  Per chart review, patient is supposed to be multiple other medications including seroquel, Lyrica, Depakote, Lexapro, Synthroid.  Additionally, patient was recently hospitalized on 6/2 for cellulitis, and at the end of his hospital stay he was given a match program to get all of his medications. He was seen in the ED on 6/14 for hypoglycemia. He has attempted suicide win insulin OD in the past, but denied this was the cause on 6/14 for his hypoglycemia.   HPI     Past Medical History:  Diagnosis Date  . Anxiety   . Bipolar disorder (HCC)   . Depression   . Diabetes mellitus without complication (HCC)   . Diabetic polyneuropathy associated with type 1 diabetes mellitus (HCC)   . Hydrocephalus (HCC)   . Kidney stones   . Seizures [facility])     Patient Active Problem List   Diagnosis Date Noted  . Hyperkalemia 04/10/2020  . AKI (acute kidney injury) (HCC) 04/10/2020  . Left leg cellulitis 03/23/2020  . Bipolar I disorder, current or most recent episode depressed, in  partial remission (HCC)   . MDD (major depressive disorder), recurrent episode, severe (HCC) 03/13/2020  . Drug overdose, intentional (HCC) 03/05/2020  . Drug overdose 03/05/2020  . Drug abuse (HCC) 03/05/2020  . Attempted suicide (HCC) 03/05/2020  . Insulin overdose   . Hypoglycemia 02/03/2020  . Hypothyroidism 02/02/2020  . Diabetic polyneuropathy associated with type 1 diabetes mellitus (HCC) 02/02/2020  . Type 1 diabetes mellitus with hyperosmolar hyperglycemic state (HHS) (HCC) 12/30/2019  . DKA (diabetic ketoacidosis) (HCC) 12/29/2019  . Homelessness 12/29/2019  . Marijuana abuse 12/29/2019  . Tobacco dependence 12/29/2019  . Seizure disorder (HCC) 12/29/2019  . Suicidal ideation   . Bipolar I disorder, most recent episode depressed, severe without psychotic features (HCC)   . Adjustment disorder with depressed mood 12/07/2017  . Noncompliance 10/17/2017  . Dysthymia 10/08/2017  . Personality disorder in adult [facility]) 09/26/2017  . 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 Obstructive hydrocephalus (HCC) 09/19/2017  . MDD (major depressive disorder) 08/22/2017  . Severe recurrent major depression with psychotic features (HCC) 08/19/2017  . Abdominal pain 08/18/2017  . Hyponatremia 08/18/2017  . DKA (diabetic ketoacidoses) (HCC) 11/24/2015  . Diabetic keto-acidosis (HCC) 10/23/2015    Past Surgical History:  Procedure Laterality Date  . KIDNEY STONE SURGERY         Family History  Problem Relation Age of Onset  . Breast cancer Mother     Social History   Tobacco Use  . Smoking status: Current Every Day Smoker    Packs/day:  1.00    Years: 20.00    Pack years: 20.00    Types: Cigarettes  . Smokeless tobacco: Never Used  Vaping Use  . Vaping Use: Never used  Substance Use Topics  . Alcohol use: Yes    Comment: occassional   . Drug use: Yes    Frequency: 7.0 times per week    Types: Marijuana    Comment: smoked 2-3 days ago, usually smokes daily    Home  Medications Prior to Admission medications   Medication Sig Start Date End Date Taking? Authorizing Provider  atorvastatin (LIPITOR) 20 MG tablet Take 1 tablet (20 mg total) by mouth daily. 03/14/20  Yes Lanae Boast, MD  divalproex (DEPAKOTE) 250 MG DR tablet Take 3 tablets (750 mg total) by mouth every evening. For mood stabilization 03/18/20  Yes Nwoko, Nicole Kindred I, NP  escitalopram (LEXAPRO) 20 MG tablet Take 1 tablet (20 mg total) by mouth daily. For depression 03/19/20  Yes Nwoko, Nicole Kindred I, NP  insulin detemir (LEVEMIR) 100 UNIT/ML injection Inject 0.25 mLs (25 Units total) into the skin at bedtime. For diabetes management Patient taking differently: Inject 32 Units into the skin at bedtime. For diabetes management 03/25/20  Yes Hughie Closs, MD  levothyroxine (SYNTHROID) 100 MCG tablet Take 1 tablet (100 mcg total) by mouth daily before breakfast. 12/31/19  Yes Amin, Ankit Chirag, MD  QUEtiapine (SEROQUEL) 300 MG tablet Take 1 tablet (300 mg total) by mouth at bedtime. For mood control 03/18/20  Yes Armandina Stammer I, NP  divalproex (DEPAKOTE) 500 MG DR tablet Take 1 tablet (500 mg total) by mouth daily. For mood stabilization 03/19/20   Armandina Stammer I, NP  insulin aspart (NOVOLOG) 100 UNIT/ML injection Inject 10 Units into the skin 3 (three) times daily with meals. Patient not taking: Reported on 03/23/2020 03/13/20 04/04/20  Lanae Boast, MD  pregabalin (LYRICA) 150 MG capsule Take 1 capsule (150 mg total) by mouth 2 (two) times daily. Patient not taking: Reported on 03/23/2020 12/31/19 04/04/20  Dimple Nanas, MD    Allergies    Mushroom extract complex, Penicillins, Sulfa antibiotics, and Clindamycin/lincomycin  Review of Systems   Review of Systems  Gastrointestinal: Positive for nausea and vomiting.  Endocrine: Positive for polydipsia and polyuria.  Musculoskeletal: Positive for myalgias (neuropathy).  All other systems reviewed and are negative.   Physical Exam Updated Vital Signs BP 107/84 (BP  Location: Left Arm)   Pulse 66   Temp 98.4 F (36.9 C) (Oral)   Resp 18   SpO2 98%   Physical Exam Vitals and nursing note reviewed.  Constitutional:      General: He is not in acute distress.    Appearance: He is well-developed.     Comments: Appears older than stated age. Chronically ill  HENT:     Head: Normocephalic and atraumatic.     Mouth/Throat:     Mouth: Mucous membranes are dry.     Comments: MM dry Eyes:     Conjunctiva/sclera: Conjunctivae normal.     Pupils: Pupils are equal, round, and reactive to light.  Cardiovascular:     Rate and Rhythm: Normal rate and regular rhythm.     Pulses: Normal pulses.  Pulmonary:     Effort: Pulmonary effort is normal. No respiratory distress.     Breath sounds: Normal breath sounds. No wheezing.  Abdominal:     General: There is no distension.     Palpations: Abdomen is soft. There is no mass.  Tenderness: There is no abdominal tenderness. There is no guarding or rebound.     Comments: No ttp of the abd  Musculoskeletal:        General: Normal range of motion.     Cervical back: Normal range of motion and neck supple.     Right lower leg: No edema.     Left lower leg: No edema.  Skin:    General: Skin is warm and dry.     Capillary Refill: Capillary refill takes less than 2 seconds.  Neurological:     Mental Status: He is alert and oriented to person, place, and time.     ED Results / Procedures / Treatments   Labs (all labs ordered are listed, but only abnormal results are displayed) Labs Reviewed  URINALYSIS, ROUTINE W REFLEX MICROSCOPIC - Abnormal; Notable for the following components:      Result Value   Color, Urine COLORLESS (*)    Glucose, UA >=500 (*)    Ketones, ur 5 (*)    All other components within normal limits  COMPREHENSIVE METABOLIC PANEL - Abnormal; Notable for the following components:   Sodium 127 (*)    Potassium 7.1 (*)    Chloride 92 (*)    CO2 21 (*)    Glucose, Bld 902 (*)     Creatinine, Ser 1.32 (*)    Total Protein 8.4 (*)    Total Bilirubin 1.8 (*)    All other components within normal limits  CBG MONITORING, ED - Abnormal; Notable for the following components:   Glucose-Capillary >600 (*)    All other components within normal limits  SARS CORONAVIRUS 2 BY RT PCR (HOSPITAL ORDER, [place] [facility])  CBC  LIPASE, BLOOD  SODIUM, URINE, RANDOM  CREATININE, URINE, RANDOM  BASIC METABOLIC PANEL  BASIC METABOLIC PANEL  BASIC METABOLIC PANEL  BASIC METABOLIC PANEL  BASIC METABOLIC PANEL  OSMOLALITY  CBG MONITORING, ED    EKG EKG Interpretation  Date/Time:  Sunday April 10 2020 00:32:04 EDT Ventricular Rate:  89 PR Interval:    QRS Duration: 77 QT Interval:  361 QTC Calculation: 440 R Axis:   39 Text Interpretation: Sinus rhythm Probable left atrial enlargement Probable anteroseptal infarct, old Since last tracing 05 Mar 2020 T waves are more peaked. Confirmed by Rolland Porter [phone]) on 04/10/2020 12:41:08 AM   Radiology No results found.  Procedures .Critical Care Performed by: Franchot Heidelberg, PA-C Authorized by: Franchot Heidelberg, PA-C   Critical care provider statement:    Critical care time (minutes):  45   Critical care time was exclusive of:  Separately billable procedures and treating other patients and teaching time   Critical care was necessary to treat or prevent imminent or life-threatening deterioration of the following conditions:  Endocrine crisis and metabolic crisis   Critical care was time spent personally by me on the following activities:  Development of treatment plan with patient or surrogate, blood draw for specimens, evaluation of patient's response to treatment, examination of patient, obtaining history from patient or surrogate, ordering and performing treatments and interventions, ordering and review of radiographic studies, pulse oximetry, re-evaluation of patient's condition, review of old charts and  ordering and review of laboratory studies   I assumed direction of critical care for this patient from another provider in my specialty: no   Comments:     Pt with critically elevated potassium requiring medications and admission.    (including critical care time)  Medications Ordered  in ED Medications  sodium bicarbonate injection 50 mEq (has no administration in time range)  enoxaparin (LOVENOX) injection 40 mg (has no administration in time range)  insulin regular, human (MYXREDLIN) 100 units/ 100 mL infusion (has no administration in time range)  0.9 %  sodium chloride infusion (has no administration in time range)  dextrose 5 %-0.45 % sodium chloride infusion (has no administration in time range)  dextrose 50 % solution 0-50 mL (has no administration in time range)  sodium chloride 0.9 % bolus 1,000 mL (has no administration in time range)  calcium gluconate 1 g/ 50 mL sodium chloride IVPB (has no administration in time range)  sodium chloride 0.9 % bolus 1,000 mL (1,000 mLs Intravenous New Bag/Given 04/09/20 2310)  ondansetron (ZOFRAN) injection 4 mg (4 mg Intravenous Given 04/09/20 2311)  insulin aspart (novoLOG) injection 10 Units (10 Units Intravenous Given 04/10/20 0036)  sodium zirconium cyclosilicate (LOKELMA) packet 10 g (10 g Oral Given 04/10/20 1610)    ED Course  I have reviewed the triage vital signs and the nursing notes.  Pertinent labs & imaging results that were available during my care of the patient were reviewed by me and considered in my medical decision making (see chart for details).    MDM Rules/Calculators/A&P                          Patient presenting for evaluation of hyperglycemia.  On exam, patient appears dehydrated, but overall nontoxic.  Will obtain labs to ensure no DKA.  Will start fluids and give insulin once potassium comes back.  Patient without fever or obvious infectious cause that might be contributing to hyperglycemia.  As he is out of  insulin, this is likely the cause.  Labs interpreted by me, shows critically elevated potassium at 7.1.  Will order EKG.  Glucose elevated at 900, though patient without obvious signs of DKA.  Will start insulin, calcium, lokelma, bicarb. Case discussed with attending, Dr. Lynelle Doctor evaluated the pt. Pt to be admitted.   ekg shows mildly peaked T waves when compared to previous.   Discussed with Dr. Antionette Char from [facility] service, pt to be admitted.  Final Clinical Impression(s) / ED Diagnoses Final diagnoses:  Hyperkalemia  Hyperglycemia    Rx / DC Orders ED Discharge Orders    None       Alveria Apley, PA-C 04/10/20 0106    Devoria Albe, MD 04/10/20 [phone]

## 2020-04-09 NOTE — ED Notes (Signed)
Patient continually calling out asking for water after being told he could not have it several times due to him vomiting. Patient states he didn't vomit, although he did tell staff he did. Patient is verbally aggressive and cursing out staff over call bell.

## 2020-04-09 NOTE — ED Triage Notes (Signed)
Patient BIB EMS for hyperglycemia. Patient is a type 1 diabetic. Patient sugar at home read HI. Complains of nausea and frequent urination.

## 2020-04-10 DIAGNOSIS — E1069 Type 1 diabetes mellitus with other specified complication: Secondary | ICD-10-CM

## 2020-04-10 DIAGNOSIS — E1065 Type 1 diabetes mellitus with hyperglycemia: Principal | ICD-10-CM

## 2020-04-10 DIAGNOSIS — F3175 Bipolar disorder, in partial remission, most recent episode depressed: Secondary | ICD-10-CM | POA: Diagnosis not present

## 2020-04-10 DIAGNOSIS — N179 Acute kidney failure, unspecified: Secondary | ICD-10-CM | POA: Diagnosis not present

## 2020-04-10 DIAGNOSIS — E875 Hyperkalemia: Secondary | ICD-10-CM | POA: Diagnosis not present

## 2020-04-10 DIAGNOSIS — E038 Other specified hypothyroidism: Secondary | ICD-10-CM

## 2020-04-10 LAB — COMPREHENSIVE METABOLIC PANEL
ALT: 16 U/L (ref 0–44)
AST: 21 U/L (ref 15–41)
Albumin: 4.6 g/dL (ref 3.5–5.0)
Alkaline Phosphatase: 87 U/L (ref 38–126)
Anion gap: 14 (ref 5–15)
BUN: 20 mg/dL (ref 6–20)
CO2: 21 mmol/L — ABNORMAL LOW (ref 22–32)
Calcium: 9.4 mg/dL (ref 8.9–10.3)
Chloride: 92 mmol/L — ABNORMAL LOW (ref 98–111)
Creatinine, Ser: 1.32 mg/dL — ABNORMAL HIGH (ref 0.61–1.24)
GFR calc Af Amer: >60 mL/min (ref >60)
GFR calc non Af Amer: >60 mL/min (ref >60)
Glucose, Bld: 902 mg/dL (ref 70–99)
Potassium: 7.1 mmol/L (ref 3.5–5.1)
Sodium: 127 mmol/L — ABNORMAL LOW (ref 135–145)
Total Bilirubin: 1.8 mg/dL — ABNORMAL HIGH (ref 0.3–1.2)
Total Protein: 8.4 g/dL — ABNORMAL HIGH (ref 6.5–8.1)

## 2020-04-10 LAB — BASIC METABOLIC PANEL
Anion gap: 10 (ref 5–15)
Anion gap: 13 (ref 5–15)
BUN: 18 mg/dL (ref 6–20)
BUN: 16 mg/dL (ref 6–20)
CO2: 25 mmol/L (ref 22–32)
CO2: 23 mmol/L (ref 22–32)
Calcium: 9.3 mg/dL (ref 8.9–10.3)
Calcium: 9.5 mg/dL (ref 8.9–10.3)
Chloride: 97 mmol/L — ABNORMAL LOW (ref 98–111)
Chloride: 102 mmol/L (ref 98–111)
Creatinine, Ser: 1.16 mg/dL (ref 0.61–1.24)
Creatinine, Ser: 0.92 mg/dL (ref 0.61–1.24)
GFR calc Af Amer: >60 mL/min (ref >60)
GFR calc Af Amer: >60 mL/min (ref >60)
GFR calc non Af Amer: >60 mL/min (ref >60)
GFR calc non Af Amer: >60 mL/min (ref >60)
Glucose, Bld: 321 mg/dL — ABNORMAL HIGH (ref 70–99)
Glucose, Bld: 275 mg/dL — ABNORMAL HIGH (ref 70–99)
Potassium: 3.7 mmol/L (ref 3.5–5.1)
Potassium: 4.2 mmol/L (ref 3.5–5.1)
Sodium: 132 mmol/L — ABNORMAL LOW (ref 135–145)
Sodium: 138 mmol/L (ref 135–145)

## 2020-04-10 LAB — URINALYSIS, ROUTINE W REFLEX MICROSCOPIC
Bacteria, UA: NONE SEEN
Bilirubin Urine: NEGATIVE
Glucose, UA: >=500 mg/dL — AB
Hgb urine dipstick: NEGATIVE
Ketones, ur: 5 mg/dL — AB
Leukocytes,Ua: NEGATIVE
Nitrite: NEGATIVE
Protein, ur: NEGATIVE mg/dL
Specific Gravity, Urine: 1.016 (ref 1.005–1.030)
pH: 6 (ref 5.0–8.0)

## 2020-04-10 LAB — CBG MONITORING, ED
Glucose-Capillary: >600 mg/dL (ref 70–99)
Glucose-Capillary: 307 mg/dL — ABNORMAL HIGH (ref 70–99)
Glucose-Capillary: 526 mg/dL (ref 70–99)

## 2020-04-10 LAB — OSMOLALITY: Osmolality: 304 mOsm/kg — ABNORMAL HIGH (ref 275–295)

## 2020-04-10 LAB — GLUCOSE, CAPILLARY
Glucose-Capillary: 196 mg/dL — ABNORMAL HIGH (ref 70–99)
Glucose-Capillary: 138 mg/dL — ABNORMAL HIGH (ref 70–99)
Glucose-Capillary: 180 mg/dL — ABNORMAL HIGH (ref 70–99)
Glucose-Capillary: 228 mg/dL — ABNORMAL HIGH (ref 70–99)
Glucose-Capillary: 236 mg/dL — ABNORMAL HIGH (ref 70–99)
Glucose-Capillary: 391 mg/dL — ABNORMAL HIGH (ref 70–99)
Glucose-Capillary: 220 mg/dL — ABNORMAL HIGH (ref 70–99)
Glucose-Capillary: 159 mg/dL — ABNORMAL HIGH (ref 70–99)

## 2020-04-10 LAB — SODIUM, URINE, RANDOM: Sodium, Ur: 65 mmol/L

## 2020-04-10 LAB — MRSA PCR SCREENING: MRSA by PCR: NEGATIVE

## 2020-04-10 LAB — CREATININE, URINE, RANDOM: Creatinine, Urine: 10.5 mg/dL

## 2020-04-10 LAB — SARS CORONAVIRUS 2 BY RT PCR (HOSPITAL ORDER, PERFORMED IN ~~LOC~~ HOSPITAL LAB): SARS Coronavirus 2: NEGATIVE

## 2020-04-10 LAB — LIPASE, BLOOD: Lipase: 20 U/L (ref 11–51)

## 2020-04-10 MED ORDER — ATORVASTATIN CALCIUM 20 MG PO TABS
20.0000 mg | ORAL_TABLET | Freq: Every day | ORAL | Status: DC
  Administered 2020-04-10 – 2020-04-12 (×3): 20 mg via ORAL
  Filled 2020-04-10: qty 1
  Filled 2020-04-10: qty 2
  Filled 2020-04-10: qty 1

## 2020-04-10 MED ORDER — SODIUM BICARBONATE 8.4 % IV SOLN
50.0000 meq | Freq: Once | INTRAVENOUS | Status: AC
  Administered 2020-04-10: 50 meq via INTRAVENOUS
  Filled 2020-04-10: qty 50

## 2020-04-10 MED ORDER — INSULIN ASPART 100 UNIT/ML ~~LOC~~ SOLN: 0.0000 [IU] | Freq: Every day | SUBCUTANEOUS | Status: DC

## 2020-04-10 MED ORDER — INSULIN DETEMIR 100 UNIT/ML ~~LOC~~ SOLN
12.0000 [IU] | Freq: Once | SUBCUTANEOUS | Status: AC
  Administered 2020-04-10: 12 [IU] via SUBCUTANEOUS
  Filled 2020-04-10: qty 0.12

## 2020-04-10 MED ORDER — OXYCODONE-ACETAMINOPHEN 5-325 MG PO TABS
1.0000 | ORAL_TABLET | Freq: Four times a day (QID) | ORAL | Status: AC | PRN
  Administered 2020-04-10 (×2): 2 via ORAL
  Filled 2020-04-10: qty 1
  Filled 2020-04-10: qty 2

## 2020-04-10 MED ORDER — QUETIAPINE FUMARATE 200 MG PO TABS
300.0000 mg | ORAL_TABLET | Freq: Every day | ORAL | Status: DC
  Administered 2020-04-10 – 2020-04-11 (×3): 300 mg via ORAL
  Filled 2020-04-10: qty 3
  Filled 2020-04-10 (×2): qty 1

## 2020-04-10 MED ORDER — INSULIN DETEMIR 100 UNIT/ML ~~LOC~~ SOLN: 20.0000 [IU] | Freq: Every day | SUBCUTANEOUS | Status: DC

## 2020-04-10 MED ORDER — SODIUM CHLORIDE 0.9 % IV SOLN: 1.0000 g | Freq: Once | INTRAVENOUS | Status: DC

## 2020-04-10 MED ORDER — QUETIAPINE FUMARATE 100 MG PO TABS: 300.0000 mg | ORAL_TABLET | Freq: Every day | ORAL | Status: DC

## 2020-04-10 MED ORDER — KETOROLAC TROMETHAMINE 15 MG/ML IJ SOLN: 15.0000 mg | Freq: Four times a day (QID) | INTRAMUSCULAR | Status: DC | PRN

## 2020-04-10 MED ORDER — DIVALPROEX SODIUM 250 MG PO DR TAB
750.0000 mg | DELAYED_RELEASE_TABLET | Freq: Every evening | ORAL | Status: DC
  Administered 2020-04-10 – 2020-04-11 (×3): 750 mg via ORAL
  Filled 2020-04-10: qty 3
  Filled 2020-04-10: qty 1
  Filled 2020-04-10: qty 3

## 2020-04-10 MED ORDER — ESCITALOPRAM OXALATE 20 MG PO TABS
20.0000 mg | ORAL_TABLET | Freq: Every day | ORAL | Status: DC
  Administered 2020-04-10 – 2020-04-12 (×3): 20 mg via ORAL
  Filled 2020-04-10 (×3): qty 1

## 2020-04-10 MED ORDER — CALCIUM GLUCONATE-NACL 1-0.675 GM/50ML-% IV SOLN: 1.0000 g | Freq: Once | INTRAVENOUS | Status: DC

## 2020-04-10 MED ORDER — DIVALPROEX SODIUM 500 MG PO DR TAB: 750.0000 mg | DELAYED_RELEASE_TABLET | Freq: Every evening | ORAL | Status: DC

## 2020-04-10 MED ORDER — SODIUM CHLORIDE 0.9 % IV BOLUS
1000.0000 mL | INTRAVENOUS | Status: AC
  Administered 2020-04-10: 1000 mL via INTRAVENOUS

## 2020-04-10 MED ORDER — OXYCODONE-ACETAMINOPHEN 5-325 MG PO TABS
1.0000 | ORAL_TABLET | Freq: Four times a day (QID) | ORAL | Status: AC | PRN
  Administered 2020-04-10 – 2020-04-11 (×2): 1 via ORAL
  Filled 2020-04-10 (×2): qty 1

## 2020-04-10 MED ORDER — INSULIN ASPART 100 UNIT/ML ~~LOC~~ SOLN
0.0000 [IU] | Freq: Three times a day (TID) | SUBCUTANEOUS | Status: DC
  Administered 2020-04-10: 15 [IU] via SUBCUTANEOUS
  Administered 2020-04-10: 5 [IU] via SUBCUTANEOUS
  Administered 2020-04-10: 2 [IU] via SUBCUTANEOUS
  Administered 2020-04-11: 3 [IU] via SUBCUTANEOUS
  Administered 2020-04-11: 15 [IU] via SUBCUTANEOUS
  Administered 2020-04-12: 5 [IU] via SUBCUTANEOUS
  Administered 2020-04-12: 8 [IU] via SUBCUTANEOUS

## 2020-04-10 MED ORDER — DIPHENHYDRAMINE HCL 25 MG PO CAPS
25.0000 mg | ORAL_CAPSULE | Freq: Four times a day (QID) | ORAL | Status: AC | PRN
  Administered 2020-04-10: 25 mg via ORAL
  Filled 2020-04-10: qty 1

## 2020-04-10 MED ORDER — DEXTROSE 50 % IV SOLN: 0.0000 mL | INTRAVENOUS | Status: DC | PRN

## 2020-04-10 MED ORDER — CHLORHEXIDINE GLUCONATE CLOTH 2 % EX PADS
6.0000 | MEDICATED_PAD | Freq: Every day | CUTANEOUS | Status: DC
  Administered 2020-04-10: 6 via TOPICAL

## 2020-04-10 MED ORDER — ACETAMINOPHEN 325 MG PO TABS: 650.0000 mg | ORAL_TABLET | Freq: Four times a day (QID) | ORAL | Status: DC | PRN

## 2020-04-10 MED ORDER — SODIUM BICARBONATE 8.4 % IV SOLN
Freq: Once | INTRAVENOUS | Status: DC
  Filled 2020-04-10: qty 100

## 2020-04-10 MED ORDER — INSULIN REGULAR(HUMAN) IN NACL 100-0.9 UT/100ML-% IV SOLN
INTRAVENOUS | Status: DC
  Administered 2020-04-10: 13 [IU]/h via INTRAVENOUS
  Administered 2020-04-10: 5.5 [IU]/h via INTRAVENOUS
  Filled 2020-04-10: qty 100

## 2020-04-10 MED ORDER — INSULIN DETEMIR 100 UNIT/ML ~~LOC~~ SOLN
25.0000 [IU] | Freq: Every day | SUBCUTANEOUS | Status: DC
  Administered 2020-04-10 – 2020-04-11 (×2): 25 [IU] via SUBCUTANEOUS
  Filled 2020-04-10 (×2): qty 0.25

## 2020-04-10 MED ORDER — INSULIN ASPART 100 UNIT/ML ~~LOC~~ SOLN
6.0000 [IU] | Freq: Three times a day (TID) | SUBCUTANEOUS | Status: DC
  Administered 2020-04-10 – 2020-04-11 (×2): 6 [IU] via SUBCUTANEOUS

## 2020-04-10 MED ORDER — FENTANYL CITRATE (PF) 100 MCG/2ML IJ SOLN
50.0000 ug | Freq: Once | INTRAMUSCULAR | Status: AC | PRN
  Administered 2020-04-10: 50 ug via INTRAVENOUS
  Filled 2020-04-10: qty 2

## 2020-04-10 MED ORDER — SODIUM CHLORIDE 0.9 % IV SOLN: INTRAVENOUS | Status: DC

## 2020-04-10 MED ORDER — ENOXAPARIN SODIUM 40 MG/0.4ML ~~LOC~~ SOLN: 40.0000 mg | Freq: Every day | SUBCUTANEOUS | Status: DC

## 2020-04-10 MED ORDER — DIVALPROEX SODIUM 250 MG PO DR TAB
500.0000 mg | DELAYED_RELEASE_TABLET | Freq: Every day | ORAL | Status: DC
  Administered 2020-04-10 – 2020-04-12 (×3): 500 mg via ORAL
  Filled 2020-04-10 (×2): qty 2
  Filled 2020-04-10: qty 1

## 2020-04-10 MED ORDER — LEVOTHYROXINE SODIUM 100 MCG PO TABS
100.0000 ug | ORAL_TABLET | Freq: Every day | ORAL | Status: DC
  Administered 2020-04-10 – 2020-04-11 (×2): 100 ug via ORAL
  Filled 2020-04-10 (×3): qty 1

## 2020-04-10 MED ORDER — PANTOPRAZOLE SODIUM 40 MG PO TBEC
40.0000 mg | DELAYED_RELEASE_TABLET | Freq: Every day | ORAL | Status: DC
  Administered 2020-04-10 – 2020-04-12 (×3): 40 mg via ORAL
  Filled 2020-04-10 (×3): qty 1

## 2020-04-10 MED ORDER — DEXTROSE-NACL 5-0.45 % IV SOLN: INTRAVENOUS | Status: DC

## 2020-04-10 MED ORDER — SODIUM ZIRCONIUM CYCLOSILICATE 10 G PO PACK
10.0000 g | PACK | Freq: Once | ORAL | Status: AC
  Administered 2020-04-10: 10 g via ORAL
  Filled 2020-04-10: qty 1

## 2020-04-10 MED ORDER — INSULIN ASPART 100 UNIT/ML IV SOLN
10.0000 [IU] | Freq: Once | INTRAVENOUS | Status: AC
  Administered 2020-04-10: 10 [IU] via INTRAVENOUS
  Filled 2020-04-10: qty 0.1

## 2020-04-10 MED ORDER — CALCIUM GLUCONATE-NACL 1-0.675 GM/50ML-% IV SOLN
1.0000 g | Freq: Once | INTRAVENOUS | Status: AC
  Administered 2020-04-10: 1000 mg via INTRAVENOUS
  Filled 2020-04-10: qty 50

## 2020-04-10 NOTE — Progress Notes (Addendum)
  PROGRESS NOTE    Mark [name]  UOH:[id number] DOB: December 13, 1985 DOA: 04/09/2020  PCP: Patient, No Pcp Per    LOS - 0    Patient admitted overnight with HHS after running out of his insulin.  Presented with complaints of nausea, polyuria, polydipsia, home glucometer reading "high".  Blood glucose was 902 on presentation.  Interval subjective: Patient seen during breakfast.  Denies complaints including nausea/vomiting.  Discussed plan to titrate insulin for good control and he is agreeable.    Exam: awake, no distress, eating breakfast, off insulin drip.  Heart RRR. Lungs CTAB.  Abdomen soft, non-tender.  No peripheral edema.  I have reviewed the full H&P by Dr. Antionette Char in detail, and I agree with the assessment and plan as outlined therein.  In addition: Hyperkalemia resolved.   AKI improving.   Tolerating diet. Intermittent agitation and verbally abusive to staff.  --resume subcutaneous insulin --half dose Levemir given this AM, resume qHS tonight --moderate sliding scale Novolog --anticipate discharge tomorrow if adequate glycemic control  No Charge    Pennie Banter, DO [facility]   If 7PM-7AM, please contact night-coverage [email or web address] 04/10/2020, 2:19 PM

## 2020-04-10 NOTE — H&P (Signed)
History and Physical    Mark [name] XLK:[id number] DOB: [birth year] DOA: 04/09/2020  PCP: Patient, No Pcp Per   Patient coming from: Home   Chief Complaint: Hyperglycemia   HPI: Mark [name] is a 34 y.o. male with medical history significant for type 1 diabetes mellitus, bipolar disorder, and hypothyroidism, now presenting to the emergency department for evaluation of hyperglycemia after running out of insulin.  Patient is not sure why his insulin ran out early, has a history of intentional insulin overdose but denies using too much intentionally anytime recently.  He developed nausea, polyuria, and polydipsia, and his glucometer was reading "high."  He denies any fevers, chills, chest pain, abdominal pain, or vomiting.  He reports bilateral leg pain that he attributes to his neuropathy and denies any recent trauma.  ED Course: Upon arrival to the ED, patient is found to be afebrile, saturating well on room air, and with stable blood pressure.  EKG features sinus rhythm with peaked T waves.  Chemistry panel is notable for potassium of 7.1, bicarbonate 21, anion gap 14, glucose 902, and creatinine 1.32, up from 0.7 a week ago.  CBC is unremarkable.  Urinalysis with glucosuria and ketonuria.  COVID-19 screening test not yet resulted.  Patient was given a liter of normal saline, Lokelma, bicarbonate, calcium, and 10 units of IV NovoLog in the ED.  Review of Systems:  All other systems reviewed and apart from HPI, are negative.  Past Medical History:  Diagnosis Date  . Anxiety   . Bipolar disorder (HCC)   . Depression   . Diabetes mellitus without complication (HCC)   . Diabetic polyneuropathy associated with type 1 diabetes mellitus (HCC)   . Hydrocephalus (HCC)   . Kidney stones   . Seizures (HCC)     Past Surgical History:  Procedure Laterality Date  . KIDNEY STONE SURGERY       reports that he has been smoking cigarettes. He has a 20.00 pack-year smoking history. He has never  used smokeless tobacco. He reports current alcohol use. He reports current drug use. Frequency: 7.00 times per week. Drug: Marijuana.  Allergies  Allergen Reactions  . Mushroom Extract Complex Hives, Itching and Nausea And Vomiting  . Penicillins Anaphylaxis, Hives and Swelling    Has patient had a PCN reaction causing immediate rash, facial/tongue/throat swelling, SOB or lightheadedness with hypotension: Yes Has patient had a PCN reaction causing severe rash involving mucus membranes or skin necrosis: No Has patient had a PCN reaction that required hospitalization: Yes Has patient had a PCN reaction occurring within the last 10 years: Yes If all of the above answers are "NO", then may proceed with Cephalosporin use. Tolerated ceftriaxone 03/24/20  . Sulfa Antibiotics Anaphylaxis and Hives  . Clindamycin/Lincomycin Hives    Family History  Problem Relation Age of Onset  . Breast cancer Mother      Prior to Admission medications   Medication Sig Start Date End Date Taking? Authorizing Provider  atorvastatin (LIPITOR) 20 MG tablet Take 1 tablet (20 mg total) by mouth daily. 03/14/20  Yes Lanae Boast, MD  divalproex (DEPAKOTE) 250 MG DR tablet Take 3 tablets (750 mg total) by mouth every evening. For mood stabilization 03/18/20  Yes Nwoko, Nicole Kindred I, NP  escitalopram (LEXAPRO) 20 MG tablet Take 1 tablet (20 mg total) by mouth daily. For depression 03/19/20  Yes Nwoko, Nicole Kindred I, NP  insulin detemir (LEVEMIR) 100 UNIT/ML injection Inject 0.25 mLs (25 Units total) into the skin at bedtime.  For diabetes management Patient taking differently: Inject 32 Units into the skin at bedtime. For diabetes management 03/25/20  Yes Hughie Closs, MD  levothyroxine (SYNTHROID) 100 MCG tablet Take 1 tablet (100 mcg total) by mouth daily before breakfast. 12/31/19  Yes Amin, Ankit Chirag, MD  QUEtiapine (SEROQUEL) 300 MG tablet Take 1 tablet (300 mg total) by mouth at bedtime. For mood control 03/18/20  Yes Armandina Stammer  I, NP  divalproex (DEPAKOTE) 500 MG DR tablet Take 1 tablet (500 mg total) by mouth daily. For mood stabilization 03/19/20   Armandina Stammer I, NP  insulin aspart (NOVOLOG) 100 UNIT/ML injection Inject 10 Units into the skin 3 (three) times daily with meals. Patient not taking: Reported on 03/23/2020 03/13/20 04/04/20  Lanae Boast, MD  pregabalin (LYRICA) 150 MG capsule Take 1 capsule (150 mg total) by mouth 2 (two) times daily. Patient not taking: Reported on 03/23/2020 12/31/19 04/04/20  Dimple Nanas, MD    Physical Exam: Vitals:   04/09/20 2155  BP: 107/84  Pulse: 66  Resp: 18  Temp: 98.4 F (36.9 C)  TempSrc: Oral  SpO2: 98%    Constitutional: NAD, calm  Eyes: PERTLA, lids and conjunctivae normal ENMT: Mucous membranes are moist. Posterior pharynx clear of any exudate or lesions.   Neck: normal, supple, no masses, no thyromegaly Respiratory:  no wheezing, no crackles. No accessory muscle use.  Cardiovascular: S1 & S2 heard, regular rate and rhythm. No extremity edema.  Abdomen: No distension, no tenderness, soft. Bowel sounds active.  Musculoskeletal: no clubbing / cyanosis. No joint deformity upper and lower extremities.   Skin: no significant rashes, lesions, ulcers. Warm, dry, well-perfused. Neurologic: CN 2-12 grossly intact. Strength 5/5 in all 4 limbs.  Psychiatric: Alert and oriented to person, place, and situation. Calm and cooperative.    Labs and Imaging on Admission: I have personally reviewed following labs and imaging studies  CBC: Recent Labs  Lab 04/04/20 0354 04/09/20 2308  WBC 6.9 8.2  NEUTROABS 4.9  --   HGB 11.2* 13.0  HCT 34.7* 40.8  MCV 93.0 93.8  PLT 325 324   Basic Metabolic Panel: Recent Labs  Lab 04/04/20 0650 04/09/20 2308  NA 140 127*  K 3.8 7.1*  CL 105 92*  CO2 24 21*  GLUCOSE 175* 902*  BUN 6 20  CREATININE 0.70 1.32*  CALCIUM 8.4* 9.4   GFR: Estimated Creatinine Clearance: 103.6 mL/min (A) (by C-G formula based on SCr of 1.32  mg/dL (H)). Liver Function Tests: Recent Labs  Lab 04/09/20 2308  AST 21  ALT 16  ALKPHOS 87  BILITOT 1.8*  PROT 8.4*  ALBUMIN 4.6   Recent Labs  Lab 04/09/20 2308  LIPASE 20   No results for input(s): AMMONIA in the last 168 hours. Coagulation Profile: No results for input(s): INR, PROTIME in the last 168 hours. Cardiac Enzymes: No results for input(s): CKTOTAL, CKMB, CKMBINDEX, TROPONINI in the last 168 hours. BNP (last 3 results) No results for input(s): PROBNP in the last 8760 hours. HbA1C: No results for input(s): HGBA1C in the last 72 hours. CBG: Recent Labs  Lab 04/04/20 0445 04/04/20 0540 04/04/20 0634 04/09/20 2150 04/10/20 0122  GLUCAP 135* 100* 69* >600* >600*   Lipid Profile: No results for input(s): CHOL, HDL, LDLCALC, TRIG, CHOLHDL, LDLDIRECT in the last 72 hours. Thyroid Function Tests: No results for input(s): TSH, T4TOTAL, FREET4, T3FREE, THYROIDAB in the last 72 hours. Anemia Panel: No results for input(s): VITAMINB12, FOLATE, FERRITIN, TIBC, IRON, RETICCTPCT  in the last 72 hours. Urine analysis:    Component Value Date/Time   COLORURINE COLORLESS (A) 04/09/2020 2308   APPEARANCEUR CLEAR 04/09/2020 2308   APPEARANCEUR Clear 01/20/2015 2136   LABSPEC 1.016 04/09/2020 2308   LABSPEC 1.023 01/20/2015 2136   PHURINE 6.0 04/09/2020 2308   GLUCOSEU >=500 (A) 04/09/2020 2308   GLUCOSEU >=500 01/20/2015 2136   HGBUR NEGATIVE 04/09/2020 2308   BILIRUBINUR NEGATIVE 04/09/2020 2308   BILIRUBINUR Negative 01/20/2015 2136   KETONESUR 5 (A) 04/09/2020 2308   PROTEINUR NEGATIVE 04/09/2020 2308   NITRITE NEGATIVE 04/09/2020 2308   LEUKOCYTESUR NEGATIVE 04/09/2020 2308   LEUKOCYTESUR Negative 01/20/2015 2136   Sepsis Labs: @LABRCNTIP (procalcitonin:4,lacticidven:4) )No results found for this or any previous visit (from the past 240 hour(s)).   Radiological Exams on Admission: No results found.  EKG: Independently reviewed. Sinus rhythm, peaked  T-waves.   Assessment/Plan   1. Hyperkalemia  - Presents for hyperglycemia after running out of insulin and is found to have serum potassium of 7.1 with peaked T-waves on EKG  - He has mild renal insufficiency but this is likely secondary to insulin deficiency  - He was treated in ED with insulin, bicarbonate, calcium, Lokelma, and IVF  - Continue cardiac monitoring, continue IVF hydration, start insulin infusion, follow serial potassium levels until normalizes   2. HHS; T1DM  - Presents for evaluation/treatment of hyperglycemia after running out of insulin at home and is found to have serum glucose of 902 with bicarbonate 21, normal AG, and is alert and fully oriented  - He was given a 1 liter fluid bolus and 10 units IV Novolog in ED  - Continue IVF hydration and start insulin infusion with frequent CBGs and serial chem panels    3. Acute kidney injury  - SCr is 1.32 in ED, up from 0.7 one week earlier  - Likely prerenal azotemia secondary to osmotic diuresis  - Continue IVF hydration and glycemic-control, avoid nephrotoxins, and repeat chem panel    4. Bipolar disorder  - He is calm on admission and denies SI, HI, or hallucinations  - Continue home regimen with Lexapro, Depakote, and Seroquel    5. Hypothyroidism  - Continue Synthroid    DVT prophylaxis: Lovenox  Code Status: Full  Family Communication: Discussed with patient  Disposition Plan:  Patient is from: Home  Anticipated d/c is to: Home  Anticipated d/c date is: 04/12/20 Patient currently: Has life-threatening hyperkalemia  Consults called: None   Admission status: Inpatient     Vianne Bulls, MD [facility] Pager: See [email or web address]  If 7AM-7PM, please contact the daytime attending [email or web address]  04/10/2020, 1:24 AM

## 2020-04-10 NOTE — ED Notes (Signed)
Pt. Stated that he needs sleeping pill and more of pain medicine, this Nurse informed pt. that Doctor Devoria Albe will be notified of his requests. Pt  then became verbally abusive, cussing and threw the phone on the floor.pt. stated that if he wanted to leave he will and will pullout all his lines and tubings and don't care if he did not get treatment.

## 2020-04-10 NOTE — ED Notes (Addendum)
Pt. Pulled out all EKC monitoring cords, stated that  he is itching on those leads, Dr. Devoria Albe at bedside, aware. Pt. Educated on the importance of keeping his cardiac monitors. Pt. Uncooperative .

## 2020-04-11 DIAGNOSIS — E108 Type 1 diabetes mellitus with unspecified complications: Secondary | ICD-10-CM | POA: Diagnosis not present

## 2020-04-11 DIAGNOSIS — E038 Other specified hypothyroidism: Secondary | ICD-10-CM | POA: Diagnosis not present

## 2020-04-11 DIAGNOSIS — F3175 Bipolar disorder, in partial remission, most recent episode depressed: Secondary | ICD-10-CM | POA: Diagnosis not present

## 2020-04-11 LAB — GLUCOSE, CAPILLARY
Glucose-Capillary: 177 mg/dL — ABNORMAL HIGH (ref 70–99)
Glucose-Capillary: 16 mg/dL — CL (ref 70–99)
Glucose-Capillary: 21 mg/dL — CL (ref 70–99)
Glucose-Capillary: 119 mg/dL — ABNORMAL HIGH (ref 70–99)
Glucose-Capillary: 426 mg/dL — ABNORMAL HIGH (ref 70–99)
Glucose-Capillary: 353 mg/dL — ABNORMAL HIGH (ref 70–99)
Glucose-Capillary: 227 mg/dL — ABNORMAL HIGH (ref 70–99)

## 2020-04-11 MED ORDER — ENOXAPARIN SODIUM 40 MG/0.4ML ~~LOC~~ SOLN: 40.0000 mg | SUBCUTANEOUS | Status: DC

## 2020-04-11 MED ORDER — DEXTROSE 50 % IV SOLN
1.0000 | Freq: Once | INTRAVENOUS | Status: AC
  Administered 2020-04-11: 50 mL via INTRAVENOUS
  Filled 2020-04-11: qty 50

## 2020-04-11 MED ORDER — DEXTROSE 50 % IV SOLN
INTRAVENOUS | Status: AC
  Filled 2020-04-11: qty 50

## 2020-04-11 NOTE — Progress Notes (Signed)
[facility] PROGRESS NOTE   Mark [name] YOV:[id number] DOB: 1986-04-09 DOA: 04/09/2020  PCP: Patient, No Pcp Per  Brief History/Interval Summary: 34 y.o. male with medical history significant for type 1 diabetes mellitus, bipolar disorder, and hypothyroidism, presented to the emergency department for evaluation of hyperglycemia after running out of insulin.  Patient is not sure why his insulin ran out early, has a history of intentional insulin overdose but denies using too much intentionally anytime recently.  He developed nausea, polyuria, and polydipsia, and his glucometer was reading "high."  He denies any fevers, chills, chest pain, abdominal pain, or vomiting.  He reports bilateral leg pain that he attributes to his neuropathy and denies any recent trauma.  Reason for Visit: Hypercalcemia  Consultants: None  Procedures: None  Antibiotics: Anti-infectives (From admission, onward)   None      Subjective/Interval History: Patient complains of pain in the legs.  Feels as if he has pins-and-needles.  He does have a history of neuropathy.  Denies any other complaints.  ROS: Denies any nausea or vomiting.  No shortness of breath.    Assessment/Plan:  Diabetes mellitus type 1 uncontrolled with hyperglycemia Patient presented with glucose levels greater than 600.  Patient was started on insulin infusion.  Once his blood glucose levels were better controlled he was started back on his long-acting insulin.  This morning he is noted to be hypoglycemic.  Patient with history of intentional insulin overdoses.  Per nursing staff he does not have access to his medications currently.  Actually he mentioned that he ran out of his insulin.  His low glucose levels could be due to poor oral intake.  Will discontinue his meal coverage.  D50 had to be given.  Monitor him for another 24 hours.  Hyperkalemia Corrected after he was given insulin bicarbonate calcium and Lokelma.  Acute  kidney injury Resolved with IV fluids.  Bipolar disorder Continue Lexapro Depakote and Seroquel.  Hypothyroidism Continue levothyroxine  Peripheral neuropathy secondary to diabetes Says that he is on Neurontin at home.  Not noted to be on it here in the hospital.  Not noted in the home medication list either.  He is on multiple other psychotropic agents.  Will need to be careful starting him on new medications to avoid significant side effects.  Obesity Estimated body mass index is 32.1 kg/m as calculated from the following:   Height as of this encounter: 6\' 2"  (1.88 m).   Weight as of 04/04/20: 113.4 kg.    DVT Prophylaxis: Initiate Lovenox Code Status: Full code Family Communication: Discussed with the patient Disposition Plan:  Status is: Inpatient  Remains inpatient appropriate because:IV treatments appropriate due to intensity of illness or inability to take PO   Dispo:  Patient From: Home  Planned Disposition: Home  Expected discharge date:04/12/20  Medically stable for discharge: No          Medications:  Scheduled: . atorvastatin  20 mg Oral Daily  . Chlorhexidine Gluconate Cloth  6 each Topical Daily  . divalproex  500 mg Oral Daily  . divalproex  750 mg Oral QPM  . escitalopram  20 mg Oral Daily  . insulin aspart  0-15 Units Subcutaneous TID WC  . insulin aspart  0-5 Units Subcutaneous QHS  . insulin detemir  25 Units Subcutaneous QHS  . levothyroxine  100 mcg Oral Q0600  . pantoprazole  40 mg Oral Daily  . QUEtiapine  300 mg Oral QHS   Continuous:  XAJ:OINOMVEHMCNOB,  ketorolac   Objective:  Vital Signs  Vitals:   04/10/20 2111 04/10/20 2304 04/11/20 0623 04/11/20 1306  BP: 111/76 (!) 103/53 107/69 112/68  Pulse: 68 67 68 64  Resp: 16 18 18 18   Temp: 98.5 F (36.9 C) 97.8 F (36.6 C) 97.8 F (36.6 C) 98.9 F (37.2 C)  TempSrc: Oral Oral Oral Oral  SpO2: 97% 94% 98% 98%  Height:        Intake/Output Summary (Last 24 hours) at  04/11/2020 1320 Last data filed at 04/11/2020 0802 Gross per 24 hour  Intake 801.47 ml  Output --  Net 801.47 ml   There were no vitals filed for this visit.  General appearance: Awake alert.  In no distress Resp: Clear to auscultation bilaterally.  Normal effort Cardio: S1-S2 is normal regular.  No S3-S4.  No rubs murmurs or bruit GI: Abdomen is soft.  Nontender nondistended.  Bowel sounds are present normal.  No masses organomegaly Extremities: No edema.  Full range of motion of lower extremities..  Good pedal pulses.  No abnormalities noted. Neurologic: Alert and oriented x3.  No focal neurological deficits.    Lab Results:  Data Reviewed: I have personally reviewed following labs and imaging studies  CBC: Recent Labs  Lab 04/09/20 2308  WBC 8.2  HGB 13.0  HCT 40.8  MCV 93.8  PLT 324    Basic Metabolic Panel: Recent Labs  Lab 04/09/20 2308 04/10/20 0310 04/10/20 0859  NA 127* 132* 138  K 7.1* 3.7 4.2  CL 92* 97* 102  CO2 21* 25 23  GLUCOSE 902* 321* 275*  BUN 20 18 16   CREATININE 1.32* 1.16 0.92  CALCIUM 9.4 9.3 9.5    GFR: Estimated Creatinine Clearance: 148.7 mL/min (by C-G formula based on SCr of 0.92 mg/dL).  Liver Function Tests: Recent Labs  Lab 04/09/20 2308  AST 21  ALT 16  ALKPHOS 87  BILITOT 1.8*  PROT 8.4*  ALBUMIN 4.6    Recent Labs  Lab 04/09/20 2308  LIPASE 20    CBG: Recent Labs  Lab 04/11/20 0738 04/11/20 1131 04/11/20 1133 04/11/20 1149 04/11/20 1232  GLUCAP 177* 24* 21* 16* 119*     Recent Results (from the past 240 hour(s))  SARS Coronavirus 2 by RT PCR (hospital order, performed in [name] hospital lab) Nasopharyngeal Nasopharyngeal Swab     Status: None   Collection Time: 04/10/20  3:19 AM   Specimen: Nasopharyngeal Swab  Result Value Ref Range Status   SARS Coronavirus 2 NEGATIVE NEGATIVE Final    Comment: (NOTE) SARS-CoV-2 target nucleic acids are NOT DETECTED.  The SARS-CoV-2 RNA is generally  detectable in upper and lower respiratory specimens during the acute phase of infection. The lowest concentration of SARS-CoV-2 viral copies this assay can detect is 250 copies / mL. A negative result does not preclude SARS-CoV-2 infection and should not be used as the sole basis for treatment or other patient management decisions.  A negative result may occur with improper specimen collection / handling, submission of specimen other than nasopharyngeal swab, presence of viral mutation(s) within the areas targeted by this assay, and inadequate number of viral copies (<250 copies / mL). A negative result must be combined with clinical observations, patient history, and epidemiological information.  Fact Sheet for Patients:   [facility]  Fact Sheet for Healthcare Providers: 04/12/20  This test is not yet approved or  cleared by the [employer] FDA and has been authorized for detection and/or diagnosis of  SARS-CoV-2 by FDA under an Emergency Use Authorization (EUA).  This EUA will remain in effect (meaning this test can be used) for the duration of the COVID-19 declaration under Section 564(b)(1) of the Act, 21 U.S.C. section 360bbb-3(b)(1), unless the authorization is terminated or revoked sooner.  Performed at [facility], [address] [place], [place] [address]   MRSA PCR Screening     Status: None   Collection Time: 04/10/20  4:02 AM   Specimen: Nasopharyngeal  Result Value Ref Range Status   MRSA by PCR NEGATIVE NEGATIVE Final    Comment:        The GeneXpert MRSA Assay (FDA approved for NASAL specimens only), is one component of a comprehensive MRSA colonization surveillance program. It is not intended to diagnose MRSA infection nor to guide or monitor treatment for MRSA infections. Performed at [facility], [address] [place], [place] [address]        Radiology Studies: No results found.     LOS: 1 day   Nannie Starzyk [employer] on [email or web address]  04/11/2020, 1:20 PM

## 2020-04-11 NOTE — Progress Notes (Signed)
Hypoglycemic Event  CBG: 16  Treatment: 8 oz of coke and 1 amp of D50  Symptoms: patient states he is still feeling a little weak  Follow-up CBG: Time: 1230 CBG Result:119  Possible Reasons for Event: unknown  Comments/MD notified: Barnie Del, MD    Ty Hilts

## 2020-04-11 NOTE — Progress Notes (Signed)
Hypoglycemic Event  CBG: 21  Treatment: 8 oz of orange juice  Symptoms: patient reports feeling a little weak  Follow-up CBG: Time: 1150 CBG Result: 16  Possible Reasons for Event: unknown  Comments/MD notified:G Rito Ehrlich, MD    Ty Hilts

## 2020-04-12 DIAGNOSIS — E1069 Type 1 diabetes mellitus with other specified complication: Secondary | ICD-10-CM | POA: Diagnosis not present

## 2020-04-12 DIAGNOSIS — E1065 Type 1 diabetes mellitus with hyperglycemia: Secondary | ICD-10-CM | POA: Diagnosis not present

## 2020-04-12 LAB — GLUCOSE, CAPILLARY
Glucose-Capillary: 24 mg/dL — CL (ref 70–99)
Glucose-Capillary: 275 mg/dL — ABNORMAL HIGH (ref 70–99)
Glucose-Capillary: 216 mg/dL — ABNORMAL HIGH (ref 70–99)

## 2020-04-12 LAB — CBC
HCT: 38.6 % — ABNORMAL LOW (ref 39.0–52.0)
Hemoglobin: 12.5 g/dL — ABNORMAL LOW (ref 13.0–17.0)
MCH: 29.6 pg (ref 26.0–34.0)
MCHC: 32.4 g/dL (ref 30.0–36.0)
MCV: 91.5 fL (ref 80.0–100.0)
Platelets: 246 10*3/uL (ref 150–400)
RBC: 4.22 MIL/uL (ref 4.22–5.81)
RDW: 13.8 % (ref 11.5–15.5)
WBC: 6.9 10*3/uL (ref 4.0–10.5)
nRBC: 0 % (ref 0.0–0.2)

## 2020-04-12 LAB — BASIC METABOLIC PANEL
Anion gap: 9 (ref 5–15)
BUN: 13 mg/dL (ref 6–20)
CO2: 28 mmol/L (ref 22–32)
Calcium: 8.8 mg/dL — ABNORMAL LOW (ref 8.9–10.3)
Chloride: 102 mmol/L (ref 98–111)
Creatinine, Ser: 0.91 mg/dL (ref 0.61–1.24)
GFR calc Af Amer: >60 mL/min (ref >60)
GFR calc non Af Amer: >60 mL/min (ref >60)
Glucose, Bld: 196 mg/dL — ABNORMAL HIGH (ref 70–99)
Potassium: 4 mmol/L (ref 3.5–5.1)
Sodium: 139 mmol/L (ref 135–145)

## 2020-04-12 MED ORDER — LEVOTHYROXINE SODIUM 100 MCG PO TABS: 100.0000 ug | ORAL_TABLET | Freq: Every day | ORAL | 0 refills | Status: DC

## 2020-04-12 MED ORDER — ATORVASTATIN CALCIUM 20 MG PO TABS: 20.0000 mg | ORAL_TABLET | Freq: Every day | ORAL | 1 refills | Status: DC

## 2020-04-12 MED ORDER — DIVALPROEX SODIUM 250 MG PO DR TAB: 750.0000 mg | DELAYED_RELEASE_TABLET | Freq: Every evening | ORAL | 1 refills | Status: DC

## 2020-04-12 MED ORDER — QUETIAPINE FUMARATE 300 MG PO TABS: 300.0000 mg | ORAL_TABLET | Freq: Every day | ORAL | 0 refills | Status: DC

## 2020-04-12 MED ORDER — ESCITALOPRAM OXALATE 20 MG PO TABS: 20.0000 mg | ORAL_TABLET | Freq: Every day | ORAL | 0 refills | Status: DC

## 2020-04-12 MED ORDER — DIVALPROEX SODIUM 500 MG PO DR TAB: 500.0000 mg | DELAYED_RELEASE_TABLET | Freq: Every day | ORAL | 0 refills | Status: DC

## 2020-04-12 MED ORDER — INSULIN DETEMIR 100 UNIT/ML FLEXPEN
25.0000 [IU] | PEN_INJECTOR | Freq: Every day | SUBCUTANEOUS | 3 refills | Status: DC
Start: 2020-04-12 — End: 2020-04-22

## 2020-04-12 NOTE — Discharge Instructions (Signed)

## 2020-04-12 NOTE — Progress Notes (Signed)
Pt discharged home in stable condition. Discharge instructions, scripts and bus passes given to patient.  Pt verbalized understanding of DC instructions and the need to go to [facility] today to get medications. No questions at this time. Pt opted to ambulate off the department.

## 2020-04-12 NOTE — Discharge Summary (Signed)
[facility]  Physician Discharge Summary   Patient ID: Mark [name] MRN: [id number] DOB/AGE: 36/01/1986 36 y.o.  Admit date: 04/09/2020 Discharge date: 04/12/2020  PCP: Patient, No Pcp Per  DISCHARGE DIAGNOSES:  Diabetes mellitus type 1 uncontrolled with hyperglycemia with occasional episodes of hypoglycemia Hyperkalemia, resolved Acute kidney injury, resolved History of bipolar disorder History of hypothyroidism History of peripheral neuropathy secondary to diabetes  RECOMMENDATIONS FOR OUTPATIENT FOLLOW UP: 1. Outpatient follow-up for further management of diabetes.    Home Health: None Equipment/Devices: None  CODE STATUS: Full code  DISCHARGE CONDITION: fair  Diet recommendation: Modified carbohydrate  INITIAL HISTORY: 34 y.o.malewith medical history significant fortype 1 diabetes mellitus, bipolar disorder, and hypothyroidism, presented to the emergency department for evaluation of hyperglycemia after running out of insulin. Patient is not sure why his insulin ran out early, has a history of intentional insulin overdose but denies using too much intentionally anytime recently. He developed nausea, polyuria, and polydipsia, and his glucometer was reading "high." He denies any fevers, chills, chest pain, abdominal pain, or vomiting. He reports bilateral leg pain that he attributes to his neuropathy and denies any recent trauma.    HOSPITAL COURSE:   Diabetes mellitus type 1 uncontrolled with hyperglycemia Patient presented with glucose levels greater than 600.  Patient was started on insulin infusion.  Once his blood glucose levels were better controlled he was started back on his long-acting insulin.    On June 21 morning he was noted to be hypoglycemic.  Patient with history of intentional insulin overdoses.  Per nursing staff he does not have access to his medications currently.  Actually he mentioned that he ran out of his insulin.  His low glucose  levels could be due to poor oral intake.    The NovoLog meal coverage was discontinued.  He had to be given D50.  Subsequently monitored.  His oral intake improved.  His CBGs stabilized.  No further episodes of hypoglycemia.  HbA1c 8.8.  Okay for discharge.   Hyperkalemia Corrected after he was given insulin bicarbonate calcium and Lokelma.  Acute kidney injury Resolved with IV fluids.  Bipolar disorder Continue Lexapro Depakote and Seroquel.  Hypothyroidism Continue levothyroxine  Peripheral neuropathy secondary to diabetes Says that he is on Neurontin at home.  Not noted to be on it here in the hospital.  Not noted in the home medication list either.  He is on multiple other psychotropic agents.  Will need to be careful starting him on new medications to avoid significant side effects.  Will defer this to outpatient providers.  Obesity Estimated body mass index is 32.1 kg/m as calculated from the following:   Height as of this encounter: 6\' 2"  (1.88 m).   Weight as of 04/04/20: 113.4 kg.   Overall stable.  Okay for discharge home today.  Case management to assist with medications and PCP.   PERTINENT LABS:  The results of significant diagnostics from this hospitalization (including imaging, microbiology, ancillary and laboratory) are listed below for reference.    Microbiology: Recent Results (from the past 240 hour(s))  SARS Coronavirus 2 by RT PCR (hospital order, performed in [facility] lab) Nasopharyngeal Nasopharyngeal Swab     Status: None   Collection Time: 04/10/20  3:19 AM   Specimen: Nasopharyngeal Swab  Result Value Ref Range Status   SARS Coronavirus 2 NEGATIVE NEGATIVE Final    Comment: (NOTE) SARS-CoV-2 target nucleic acids are NOT DETECTED.  The SARS-CoV-2 RNA is generally detectable in upper  and lower respiratory specimens during the acute phase of infection. The lowest concentration of SARS-CoV-2 viral copies this assay can detect is  250 copies / mL. A negative result does not preclude SARS-CoV-2 infection and should not be used as the sole basis for treatment or other patient management decisions.  A negative result may occur with improper specimen collection / handling, submission of specimen other than nasopharyngeal swab, presence of viral mutation(s) within the areas targeted by this assay, and inadequate number of viral copies (<250 copies / mL). A negative result must be combined with clinical observations, patient history, and epidemiological information.  Fact Sheet for Patients:   StrictlyIdeas.no  Fact Sheet for Healthcare Providers: BankingDealers.co.za  This test is not yet approved or  cleared by the [place] FDA and has been authorized for detection and/or diagnosis of SARS-CoV-2 by FDA under an Emergency Use Authorization (EUA).  This EUA will remain in effect (meaning this test can be used) for the duration of the COVID-19 declaration under Section 564(b)(1) of the Act, 21 U.S.C. section 360bbb-3(b)(1), unless the authorization is terminated or revoked sooner.  Performed at [facility], [place] [address], [place], [place] [address]   MRSA PCR Screening     Status: None   Collection Time: 04/10/20  4:02 AM   Specimen: Nasopharyngeal  Result Value Ref Range Status   MRSA by PCR NEGATIVE NEGATIVE Final    Comment:        The GeneXpert MRSA Assay (FDA approved for NASAL specimens only), is one component of a comprehensive MRSA colonization surveillance program. It is not intended to diagnose MRSA infection nor to guide or monitor treatment for MRSA infections. Performed at [facility], [place] [address], [place], [place] [address]      Labs:    Basic Metabolic Panel: Recent Labs  Lab 04/09/20 2308 04/10/20 0310 04/10/20 0859 04/12/20 0548  NA 127* 132* 138 139  K 7.1* 3.7 4.2 4.0  CL 92* 97*  102 102  CO2 21* 25 23 28   GLUCOSE 902* 321* 275* 196*  BUN 20 18 16 13   CREATININE 1.32* 1.16 0.92 0.91  CALCIUM 9.4 9.3 9.5 8.8*   Liver Function Tests: Recent Labs  Lab 04/09/20 2308  AST 21  ALT 16  ALKPHOS 87  BILITOT 1.8*  PROT 8.4*  ALBUMIN 4.6   Recent Labs  Lab 04/09/20 2308  LIPASE 20   CBC: Recent Labs  Lab 04/09/20 2308 04/12/20 0548  WBC 8.2 6.9  HGB 13.0 12.5*  HCT 40.8 38.6*  MCV 93.8 91.5  PLT 324 246    CBG: Recent Labs  Lab 04/11/20 1644 04/11/20 1755 04/11/20 2204 04/12/20 0742 04/12/20 1136  GLUCAP 426* 353* 227* 275* 216*     IMAGING STUDIES DG Chest 2 View  Result Date: 03/28/2020 CLINICAL DATA:  Left leg redness and swelling. EXAM: CHEST - 2 VIEW COMPARISON:  November 25, 2017 FINDINGS: The heart size and mediastinal contours are within normal limits. Both lungs are clear. The visualized skeletal structures are unremarkable. IMPRESSION: No active cardiopulmonary disease. Electronically Signed   By: Virgina Norfolk M.D.   On: 03/28/2020 20:32   VAS [place] LOWER EXTREMITY VENOUS (DVT)  Result Date: 03/25/2020  Lower Venous DVTStudy Indications: Edema, and Pain.  Limitations: Pain tolerance. Comparison Study: no prior Performing Technologist: Abram Sander RVS  Examination Guidelines: A complete evaluation includes B-mode imaging, spectral Doppler, color Doppler, and power Doppler as needed of all accessible portions of  each vessel. Bilateral testing is considered an integral part of a complete examination. Limited examinations for reoccurring indications may be performed as noted. The reflux portion of the exam is performed with the patient in reverse Trendelenburg.  +-----+---------------+---------+-----------+----------+--------------+ RIGHTCompressibilityPhasicitySpontaneityPropertiesThrombus Aging +-----+---------------+---------+-----------+----------+--------------+ CFV  Full           Yes      Yes                                  +-----+---------------+---------+-----------+----------+--------------+   +---------+---------------+---------+-----------+----------+--------------+ LEFT     CompressibilityPhasicitySpontaneityPropertiesThrombus Aging +---------+---------------+---------+-----------+----------+--------------+ CFV      Full           Yes      Yes                                 +---------+---------------+---------+-----------+----------+--------------+ SFJ      Full                                                        +---------+---------------+---------+-----------+----------+--------------+ FV Prox  Full                                                        +---------+---------------+---------+-----------+----------+--------------+ FV Mid                  Yes      Yes                                 +---------+---------------+---------+-----------+----------+--------------+ FV Distal               Yes      Yes                                 +---------+---------------+---------+-----------+----------+--------------+ PFV      Full                                                        +---------+---------------+---------+-----------+----------+--------------+ POP      Full           Yes      Yes                                 +---------+---------------+---------+-----------+----------+--------------+ PTV      Full                                                        +---------+---------------+---------+-----------+----------+--------------+ PERO     Full                                                        +---------+---------------+---------+-----------+----------+--------------+  Summary: RIGHT: - No evidence of common femoral vein obstruction.  LEFT: - There is no evidence of deep vein thrombosis in the lower extremity.  - No cystic structure found in the popliteal fossa.  *See table(s) above for measurements and observations. Electronically  signed by Fabienne Bruns MD on 03/25/2020 at 7:41:36 PM.    Final     DISCHARGE EXAMINATION: Vitals:   04/10/20 2304 04/11/20 0623 04/11/20 1306 04/11/20 2230  BP: (!) 103/53 107/69 112/68 125/72  Pulse: 67 68 64 71  Resp: 18 18 18 18   Temp: 97.8 F (36.6 C) 97.8 F (36.6 C) 98.9 F (37.2 C) 98 F (36.7 C)  TempSrc: Oral Oral Oral Oral  SpO2: 94% 98% 98% 96%  Height:       General appearance: Awake alert.  In no distress Resp: Clear to auscultation bilaterally.  Normal effort Cardio: S1-S2 is normal regular.  No S3-S4.  No rubs murmurs or bruit GI: Abdomen is soft.  Nontender nondistended.  Bowel sounds are present normal.  No masses organomegaly    DISPOSITION: Home.  Patient lives with his friend.  Recently moved to [place] from [place].  Discharge Instructions    Call MD for:  difficulty breathing, headache or visual disturbances   Complete by: As directed    Call MD for:  extreme fatigue   Complete by: As directed    Call MD for:  persistant dizziness or light-headedness   Complete by: As directed    Call MD for:  persistant nausea and vomiting   Complete by: As directed    Call MD for:  severe uncontrolled pain   Complete by: As directed    Call MD for:  temperature >100.4   Complete by: As directed    Diet Carb Modified   Complete by: As directed    Discharge instructions   Complete by: As directed    Please take your medications as prescribed.  Follow-up with outpatient providers.  Refill your insulin before it runs out.  You were cared for by a hospitalist during your hospital stay. If you have any questions about your discharge medications or the care you received while you were in the hospital after you are discharged, you can call the unit and asked to speak with the hospitalist on call if the hospitalist that took care of you is not available. Once you are discharged, your primary care physician will handle any further medical issues. Please note that NO  REFILLS for any discharge medications will be authorized once you are discharged, as it is imperative that you return to your primary care physician (or establish a relationship with a primary care physician if you do not have one) for your aftercare needs so that they can reassess your need for medications and monitor your lab values. If you do not have a primary care physician, you can call [phone] for a physician referral.   Increase activity slowly   Complete by: As directed         Allergies as of 04/12/2020      Reactions   Mushroom Extract Complex Hives, Itching, Nausea And Vomiting   Penicillins Anaphylaxis, Hives, Swelling   Has patient had a PCN reaction causing immediate rash, facial/tongue/throat swelling, SOB or lightheadedness with hypotension: Yes Has patient had a PCN reaction causing severe rash involving mucus membranes or skin necrosis: No Has patient had a PCN reaction that required hospitalization: Yes Has patient had a PCN reaction occurring within the last 10  years: Yes If all of the above answers are "NO", then may proceed with Cephalosporin use. Tolerated ceftriaxone 03/24/20   Sulfa Antibiotics Anaphylaxis, Hives   Clindamycin/lincomycin Hives      Medication List    TAKE these medications   atorvastatin 20 MG tablet Commonly known as: LIPITOR Take 1 tablet (20 mg total) by mouth daily.   divalproex 500 MG DR tablet Commonly known as: DEPAKOTE Take 1 tablet (500 mg total) by mouth daily. For mood stabilization   divalproex 250 MG DR tablet Commonly known as: DEPAKOTE Take 3 tablets (750 mg total) by mouth every evening. For mood stabilization   escitalopram 20 MG tablet Commonly known as: LEXAPRO Take 1 tablet (20 mg total) by mouth daily. For depression   insulin detemir 100 UNIT/ML FlexPen Commonly known as: LEVEMIR Inject 25 Units into the skin at bedtime. What changed: additional instructions   levothyroxine 100 MCG tablet Commonly known as:  SYNTHROID Take 1 tablet (100 mcg total) by mouth daily before breakfast.   QUEtiapine 300 MG tablet Commonly known as: SEROQUEL Take 1 tablet (300 mg total) by mouth at bedtime. For mood control         Follow-up Information    [facility] Follow up.   Why: Appointment at 1030 am. Please come 15 minutes early. They have financial counselors to apply for [employer] for long term medication assistance. Contact information: [address] [place] [address] [phone]              TOTAL DISCHARGE TIME: 35 minutes  Mark [name]  [facility] Pager on [email or web address]  04/12/2020, 1:35 PM

## 2020-04-12 NOTE — TOC Transition Note (Signed)
Transition of Care [facility]) - CM/SW Discharge Note   Patient Details  Name: DEMARI GALES MRN: [id number] Date of Birth: 09-23-1986  Transition of Care [facility]) CM/SW Contact:  Bartholome Bill, RN Phone Number: 04/12/2020, 1:36 PM   Clinical Narrative:     This pt has had multiple admits recently and somehow has received 3 MATCH letters this year for medication assistance. Pt has a follow up appointment at [facility] tomorrow, which was placed on AVS. Pt was instructed to take his prescriptions to [facility] pharmacy to see if they can be filled for a reduced cost there. Pt was given 4 bus passes by this CM for to and from [facility] today and tomorrow. Pt states that he will follow up at [facility].     Readmission Risk Interventions Readmission Risk Prevention Plan 03/25/2020  Transportation Screening Complete  Medication Review [employer]) Complete  PCP or Specialist appointment within 3-5 days of discharge Complete  HRI or Home Care Consult (No Data)  SW Recovery Care/Counseling Consult Complete  Palliative Care Screening Not Applicable  Skilled Nursing Facility Not Applicable  Some recent data might be hidden

## 2020-04-13 ENCOUNTER — Encounter (HOSPITAL_COMMUNITY): Payer: Self-pay

## 2020-04-13 ENCOUNTER — Emergency Department (HOSPITAL_COMMUNITY)
Admission: EM | Admit: 2020-04-13 | Discharge: 2020-04-14 | Disposition: A | Discharge status: Home / Self Care | Payer: Self-pay | Attending: Emergency Medicine | Admitting: Emergency Medicine

## 2020-04-13 ENCOUNTER — Inpatient Hospital Stay: Payer: Self-pay

## 2020-04-13 DIAGNOSIS — E039 Hypothyroidism, unspecified: Secondary | ICD-10-CM | POA: Insufficient documentation

## 2020-04-13 DIAGNOSIS — F17210 Nicotine dependence, cigarettes, uncomplicated: Secondary | ICD-10-CM | POA: Insufficient documentation

## 2020-04-13 DIAGNOSIS — T383X1A Poisoning by insulin and oral hypoglycemic [antidiabetic] drugs, accidental (unintentional), initial encounter: Secondary | ICD-10-CM | POA: Insufficient documentation

## 2020-04-13 DIAGNOSIS — E871 Hypo-osmolality and hyponatremia: Secondary | ICD-10-CM | POA: Insufficient documentation

## 2020-04-13 DIAGNOSIS — R739 Hyperglycemia, unspecified: Secondary | ICD-10-CM | POA: Insufficient documentation

## 2020-04-13 DIAGNOSIS — R569 Unspecified convulsions: Secondary | ICD-10-CM | POA: Insufficient documentation

## 2020-04-13 DIAGNOSIS — Z7984 Long term (current) use of oral hypoglycemic drugs: Secondary | ICD-10-CM | POA: Insufficient documentation

## 2020-04-13 LAB — URINALYSIS, ROUTINE W REFLEX MICROSCOPIC
Bacteria, UA: NONE SEEN
Bilirubin Urine: NEGATIVE
Glucose, UA: >=500 mg/dL — AB
Hgb urine dipstick: NEGATIVE
Ketones, ur: 20 mg/dL — AB
Leukocytes,Ua: NEGATIVE
Nitrite: NEGATIVE
Protein, ur: NEGATIVE mg/dL
Specific Gravity, Urine: 1.023 (ref 1.005–1.030)
pH: 6 (ref 5.0–8.0)

## 2020-04-13 LAB — CBC
HCT: 38 % — ABNORMAL LOW (ref 39.0–52.0)
Hemoglobin: 12.1 g/dL — ABNORMAL LOW (ref 13.0–17.0)
MCH: 30.1 pg (ref 26.0–34.0)
MCHC: 31.8 g/dL (ref 30.0–36.0)
MCV: 94.5 fL (ref 80.0–100.0)
Platelets: 196 10*3/uL (ref 150–400)
RBC: 4.02 MIL/uL — ABNORMAL LOW (ref 4.22–5.81)
RDW: 13.7 % (ref 11.5–15.5)
WBC: 7.5 10*3/uL (ref 4.0–10.5)
nRBC: 0 % (ref 0.0–0.2)

## 2020-04-13 LAB — COMPREHENSIVE METABOLIC PANEL
ALT: 14 U/L (ref 0–44)
AST: 16 U/L (ref 15–41)
Albumin: 4 g/dL (ref 3.5–5.0)
Alkaline Phosphatase: 76 U/L (ref 38–126)
Anion gap: 11 (ref 5–15)
BUN: 19 mg/dL (ref 6–20)
CO2: 24 mmol/L (ref 22–32)
Calcium: 8.6 mg/dL — ABNORMAL LOW (ref 8.9–10.3)
Chloride: 99 mmol/L (ref 98–111)
Creatinine, Ser: 1.19 mg/dL (ref 0.61–1.24)
GFR calc Af Amer: >60 mL/min (ref >60)
GFR calc non Af Amer: >60 mL/min (ref >60)
Glucose, Bld: 703 mg/dL (ref 70–99)
Potassium: 5 mmol/L (ref 3.5–5.1)
Sodium: 134 mmol/L — ABNORMAL LOW (ref 135–145)
Total Bilirubin: 1.2 mg/dL (ref 0.3–1.2)
Total Protein: 7.1 g/dL (ref 6.5–8.1)

## 2020-04-13 LAB — CBG MONITORING, ED: Glucose-Capillary: >600 mg/dL (ref 70–99)

## 2020-04-13 MED ORDER — INSULIN ASPART 100 UNIT/ML ~~LOC~~ SOLN
10.0000 [IU] | Freq: Once | SUBCUTANEOUS | Status: AC
  Administered 2020-04-13: 10 [IU] via INTRAVENOUS
  Filled 2020-04-13: qty 0.1

## 2020-04-13 MED ORDER — SODIUM CHLORIDE 0.9 % IV BOLUS
1000.0000 mL | Freq: Once | INTRAVENOUS | Status: AC
  Administered 2020-04-13: 1000 mL via INTRAVENOUS

## 2020-04-13 NOTE — ED Triage Notes (Signed)
Pt states he's been out of his insulin for awhile, per EMS CBG read high

## 2020-04-13 NOTE — ED Notes (Signed)
Date and time results received: 04/13/20 2255 (use smartphrase ".now" to insert current time)  Test: glucose Critical Value: 703  Name of Provider Notified: sammy, pa  Orders Received? Or Actions Taken?: Orders Received - See Orders for details

## 2020-04-13 NOTE — ED Notes (Signed)
EMS has given 600cc NS in route

## 2020-04-13 NOTE — ED Provider Notes (Signed)
[facility]-EMERGENCY DEPT Provider Note   CSN: [id number] Arrival date & time: 04/13/20  1958     History Chief Complaint  Patient presents with  . Hyperglycemia    Mark [name] is a 34 y.o. male with a history of T1DM, anxiety, depression, seizures, bipolar disorder, obstructive hydrocephalus, homelessness, & noncompliance who presents to the ED via EMS with complaints of hyperglycemia since discharge from the hospital yesterday. Patient states that he was just discharged from the hospital and has not gotten his insulin yet. He feels his sugars are high as he is experiencing polyuria, polydipsia, and nausea with 3 episodes of emesis. No alleviating/aggravating factors to his sxs. Denies fever, chills, cough, chest pain, dyspnea, hematemesis, diarrhea, melena, dysuria, or abdominal pain.  Per EMS to triage for additional history- CBG read as high, administered 600 cc fluid bolus PTA.   HPI     Past Medical History:  Diagnosis Date  . Anxiety   . Bipolar disorder (HCC)   . Depression   . Diabetes mellitus without complication (HCC)   . Diabetic polyneuropathy associated with type 1 diabetes mellitus (HCC)   . Hydrocephalus (HCC)   . Kidney stones   . Seizures [facility])     Patient Active Problem List   Diagnosis Date Noted  . Hyperkalemia 04/10/2020  . AKI (acute kidney injury) (HCC) 04/10/2020  . Left leg cellulitis 03/23/2020  . Bipolar I disorder, current or most recent episode depressed, in partial remission (HCC)   . MDD (major depressive disorder), recurrent episode, severe (HCC) 03/13/2020  . Drug overdose, intentional (HCC) 03/05/2020  . Drug overdose 03/05/2020  . Drug abuse (HCC) 03/05/2020  . Attempted suicide (HCC) 03/05/2020  . Insulin overdose   . Hypoglycemia 02/03/2020  . Hypothyroidism 02/02/2020  . Diabetic polyneuropathy associated with type 1 diabetes mellitus (HCC) 02/02/2020  . Type 1 diabetes mellitus with hyperosmolar  hyperglycemic state (HHS) (HCC) 12/30/2019  . DKA (diabetic ketoacidosis) (HCC) 12/29/2019  . Homelessness 12/29/2019  . Marijuana abuse 12/29/2019  . Tobacco dependence 12/29/2019  . Seizure disorder (HCC) 12/29/2019  . Suicidal ideation   . Bipolar I disorder, most recent episode depressed, severe without psychotic features (HCC)   . Adjustment disorder with depressed mood 12/07/2017  . Noncompliance 10/17/2017  . Dysthymia 10/08/2017  . Personality disorder in adult [facility]) 09/26/2017  . 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 Obstructive hydrocephalus (HCC) 09/19/2017  . MDD (major depressive disorder) 08/22/2017  . Severe recurrent major depression with psychotic features (HCC) 08/19/2017  . Abdominal pain 08/18/2017  . Hyponatremia 08/18/2017  . DKA (diabetic ketoacidoses) (HCC) 11/24/2015  . Diabetic keto-acidosis (HCC) 10/23/2015    Past Surgical History:  Procedure Laterality Date  . KIDNEY STONE SURGERY         Family History  Problem Relation Age of Onset  . Breast cancer Mother     Social History   Tobacco Use  . Smoking status: Current Every Day Smoker    Packs/day: 1.00    Years: 20.00    Pack years: 20.00    Types: Cigarettes  . Smokeless tobacco: Never Used  Vaping Use  . Vaping Use: Never used  Substance Use Topics  . Alcohol use: Yes    Comment: occassional   . Drug use: Yes    Frequency: 7.0 times per week    Types: Marijuana    Comment: smoked 2-3 days ago, usually smokes daily    Home Medications Prior to Admission medications   Medication Sig  Start Date End Date Taking? Authorizing Provider  atorvastatin (LIPITOR) 20 MG tablet Take 1 tablet (20 mg total) by mouth daily. 04/12/20   Bonnielee Haff, MD  divalproex (DEPAKOTE) 250 MG DR tablet Take 3 tablets (750 mg total) by mouth every evening. For mood stabilization 04/12/20   Bonnielee Haff, MD  divalproex (DEPAKOTE) 500 MG DR tablet Take 1 tablet (500 mg total) by mouth daily. For mood stabilization  04/12/20   Bonnielee Haff, MD  escitalopram (LEXAPRO) 20 MG tablet Take 1 tablet (20 mg total) by mouth daily. For depression 04/12/20   Bonnielee Haff, MD  insulin detemir (LEVEMIR) 100 UNIT/ML FlexPen Inject 25 Units into the skin at bedtime. 04/12/20   Bonnielee Haff, MD  levothyroxine (SYNTHROID) 100 MCG tablet Take 1 tablet (100 mcg total) by mouth daily before breakfast. 04/12/20   Bonnielee Haff, MD  QUEtiapine (SEROQUEL) 300 MG tablet Take 1 tablet (300 mg total) by mouth at bedtime. For mood control 04/12/20   Bonnielee Haff, MD  insulin aspart (NOVOLOG) 100 UNIT/ML injection Inject 10 Units into the skin 3 (three) times daily with meals. Patient not taking: Reported on 03/23/2020 03/13/20 04/04/20  Antonieta Pert, MD  pregabalin (LYRICA) 150 MG capsule Take 1 capsule (150 mg total) by mouth 2 (two) times daily. Patient not taking: Reported on 03/23/2020 12/31/19 04/04/20  Damita Lack, MD    Allergies    Mushroom extract complex, Penicillins, Sulfa antibiotics, and Clindamycin/lincomycin  Review of Systems   Review of Systems  Constitutional: Negative for chills and fever.  Respiratory: Negative for cough and shortness of breath.   Cardiovascular: Negative for chest pain.  Gastrointestinal: Positive for nausea and vomiting. Negative for abdominal pain, anal bleeding, blood in stool, constipation and diarrhea.  Endocrine: Positive for polydipsia and polyuria.  Genitourinary: Negative for dysuria.  Neurological: Negative for syncope.  All other systems reviewed and are negative.   Physical Exam Updated Vital Signs BP 112/68   Pulse 91   Temp 98.4 F (36.9 C) (Oral)   Resp 18   SpO2 97%   Physical Exam Vitals and nursing note reviewed.  Constitutional:      General: He is not in acute distress.    Appearance: He is well-developed. He is not toxic-appearing.  HENT:     Head: Normocephalic and atraumatic.     Mouth/Throat:     Mouth: Mucous membranes are dry.  Eyes:      General:        Right eye: No discharge.        Left eye: No discharge.     Conjunctiva/sclera: Conjunctivae normal.  Cardiovascular:     Rate and Rhythm: Normal rate and regular rhythm.  Pulmonary:     Effort: Pulmonary effort is normal. No respiratory distress.     Breath sounds: Normal breath sounds. No wheezing, rhonchi or rales.  Abdominal:     General: There is no distension.     Palpations: Abdomen is soft.     Tenderness: There is no abdominal tenderness. There is no guarding or rebound.  Musculoskeletal:     Cervical back: Neck supple.  Skin:    General: Skin is warm and dry.     Findings: No rash.  Neurological:     Mental Status: He is alert.     Comments: Clear speech.   Psychiatric:        Behavior: Behavior normal.     ED Results / Procedures / Treatments   Labs (all labs  ordered are listed, but only abnormal results are displayed) Labs Reviewed  CBC - Abnormal; Notable for the following components:      Result Value   RBC 4.02 (*)    Hemoglobin 12.1 (*)    HCT 38.0 (*)    All other components within normal limits  URINALYSIS, ROUTINE W REFLEX MICROSCOPIC - Abnormal; Notable for the following components:   Color, Urine STRAW (*)    Glucose, UA >=500 (*)    Ketones, ur 20 (*)    All other components within normal limits  COMPREHENSIVE METABOLIC PANEL - Abnormal; Notable for the following components:   Sodium 134 (*)    Glucose, Bld 703 (*)    Calcium 8.6 (*)    All other components within normal limits  CBG MONITORING, ED - Abnormal; Notable for the following components:   Glucose-Capillary >600 (*)    All other components within normal limits  CBG MONITORING, ED - Abnormal; Notable for the following components:   Glucose-Capillary 413 (*)    All other components within normal limits  CBG MONITORING, ED - Abnormal; Notable for the following components:   Glucose-Capillary 413 (*)    All other components within normal limits  CBG MONITORING, ED -  Abnormal; Notable for the following components:   Glucose-Capillary 324 (*)    All other components within normal limits    EKG None  Radiology No results found.  Procedures Procedures (including critical care time)  Medications Ordered in ED Medications  sodium chloride 0.9 % bolus 1,000 mL (0 mLs Intravenous Stopped 04/13/20 2349)  sodium chloride 0.9 % bolus 1,000 mL (0 mLs Intravenous Stopped 04/14/20 0125)  insulin aspart (novoLOG) injection 10 Units (10 Units Intravenous Given 04/13/20 2318)  insulin aspart (novoLOG) injection 5 Units (5 Units Subcutaneous Given 04/14/20 0357)    ED Course  I have reviewed the triage vital signs and the nursing notes.  Pertinent labs & imaging results that were available during my care of the patient were reviewed by me and considered in my medical decision making (see chart for details).    MDM Rules/Calculators/A&P                         Patient presents to the ED with complaints of hyperglycemia.  Nontoxic, resting comfortably, vitals WNL. On exam mucous membranes are dry. Abdomen is nontender w/o peritoneal signs.   Additional history obtained:  Additional history obtained from EMS to triage & review of nursing notes. Previous records obtained and reviewed.   ED course:  CBG >600 on arrival. Following initial assessment I have ordered 1L NS, pending metabolic panel prior to insulin administration.   Lab Tests:  I Ordered, reviewed, and interpreted labs, which included:  CBC: Anemia similar to prior. No leukocytosis.  CMP hyperglycemia without acidosis or elevated anion gap.  No significant electrolyte derangement. UA: Mild ketonuria.  No UTI.  Patient presentation does not appear definitively consistent with DKA or HHS, appears to have symptomatic hyperglycemia.  Will rehydrate and give 10 units of IV insulin with plan for reassessment.  --> CBG improved, subsequent additional 5 units of insulin given with further improvement  likely will continue to downtrend some, patient would like to be discharged at this time, he is tolerating PO, I have consulted transition of care team to help with patient obtaining his insulin as he is having issues with cost. I discussed results, treatment plan, need for follow-up, and return precautions with  the patient. Provided opportunity for questions, patient confirmed understanding and is in agreement with plan.   Portions of this note were generated with Scientist, clinical (histocompatibility and immunogenetics). Dictation errors may occur despite best attempts at proofreading.  Final Clinical Impression(s) / ED Diagnoses Final diagnoses:  Hyperglycemia    Rx / DC Orders ED Discharge Orders    None       Desmond Lope 04/14/20 1658    Raeford Razor, MD 04/14/20 1843

## 2020-04-14 ENCOUNTER — Other Ambulatory Visit: Payer: Self-pay

## 2020-04-14 LAB — CBG MONITORING, ED
Glucose-Capillary: 324 mg/dL — ABNORMAL HIGH (ref 70–99)
Glucose-Capillary: 413 mg/dL — ABNORMAL HIGH (ref 70–99)
Glucose-Capillary: 413 mg/dL — ABNORMAL HIGH (ref 70–99)

## 2020-04-14 MED ORDER — INSULIN ASPART 100 UNIT/ML ~~LOC~~ SOLN
5.0000 [IU] | Freq: Once | SUBCUTANEOUS | Status: AC
  Administered 2020-04-14: 5 [IU] via SUBCUTANEOUS
  Filled 2020-04-14: qty 0.05

## 2020-04-14 NOTE — Discharge Instructions (Addendum)
You are seen in the emergency department today for high blood sugar.  This improved with insulin.  Please try to pick up your prescription for your insulin and take this as prescribed, if you cannot pick this up we have consulted our transition of care team to help facilitate this, they will likely be contacting you.  Please follow attached diet guidelines.  Follow-up at the [facility] health community health and wellness clinic or the [facility] within 48 hours.  Return to the ER for new or worsening symptoms including but not limited to inability to keep fluids down, fever, abdominal pain, passing out, increased thirst/urination, or any other concerns.

## 2020-04-14 NOTE — Progress Notes (Signed)
TOC CM attempted to call to number listed on pt's chart to follow up on medications. Wrong number. Contacted pt's emergency contacts. Wrong Number. Isidoro Donning RN CCM, [facility] ED TOC CM [phone]

## 2020-04-14 NOTE — ED Notes (Signed)
Pt. CBG 413, RN, Riki Rusk made aware.

## 2020-04-21 ENCOUNTER — Encounter (HOSPITAL_COMMUNITY): Payer: Self-pay | Admitting: Emergency Medicine

## 2020-04-21 ENCOUNTER — Other Ambulatory Visit: Payer: Self-pay

## 2020-04-21 ENCOUNTER — Observation Stay (HOSPITAL_COMMUNITY)
Admission: EM | Admit: 2020-04-21 | Discharge: 2020-04-22 | Disposition: A | Discharge status: Home / Self Care | Payer: Medicaid Other | Attending: Internal Medicine | Admitting: Internal Medicine

## 2020-04-21 DIAGNOSIS — Z79890 Hormone replacement therapy (postmenopausal): Secondary | ICD-10-CM | POA: Insufficient documentation

## 2020-04-21 DIAGNOSIS — E875 Hyperkalemia: Secondary | ICD-10-CM | POA: Diagnosis present

## 2020-04-21 DIAGNOSIS — G911 Obstructive hydrocephalus: Secondary | ICD-10-CM | POA: Insufficient documentation

## 2020-04-21 DIAGNOSIS — E039 Hypothyroidism, unspecified: Secondary | ICD-10-CM | POA: Diagnosis present

## 2020-04-21 DIAGNOSIS — N179 Acute kidney failure, unspecified: Secondary | ICD-10-CM | POA: Diagnosis present

## 2020-04-21 DIAGNOSIS — E1042 Type 1 diabetes mellitus with diabetic polyneuropathy: Secondary | ICD-10-CM | POA: Insufficient documentation

## 2020-04-21 DIAGNOSIS — Z9114 Patient's other noncompliance with medication regimen: Secondary | ICD-10-CM | POA: Insufficient documentation

## 2020-04-21 DIAGNOSIS — D649 Anemia, unspecified: Secondary | ICD-10-CM | POA: Insufficient documentation

## 2020-04-21 DIAGNOSIS — E876 Hypokalemia: Secondary | ICD-10-CM | POA: Insufficient documentation

## 2020-04-21 DIAGNOSIS — F314 Bipolar disorder, current episode depressed, severe, without psychotic features: Secondary | ICD-10-CM | POA: Diagnosis present

## 2020-04-21 DIAGNOSIS — E1010 Type 1 diabetes mellitus with ketoacidosis without coma: Principal | ICD-10-CM | POA: Insufficient documentation

## 2020-04-21 DIAGNOSIS — F419 Anxiety disorder, unspecified: Secondary | ICD-10-CM | POA: Insufficient documentation

## 2020-04-21 DIAGNOSIS — Z20822 Contact with and (suspected) exposure to covid-19: Secondary | ICD-10-CM | POA: Insufficient documentation

## 2020-04-21 DIAGNOSIS — Z794 Long term (current) use of insulin: Secondary | ICD-10-CM | POA: Insufficient documentation

## 2020-04-21 DIAGNOSIS — E10649 Type 1 diabetes mellitus with hypoglycemia without coma: Secondary | ICD-10-CM | POA: Insufficient documentation

## 2020-04-21 DIAGNOSIS — E038 Other specified hypothyroidism: Secondary | ICD-10-CM

## 2020-04-21 DIAGNOSIS — F319 Bipolar disorder, unspecified: Secondary | ICD-10-CM | POA: Insufficient documentation

## 2020-04-21 DIAGNOSIS — Z79899 Other long term (current) drug therapy: Secondary | ICD-10-CM | POA: Insufficient documentation

## 2020-04-21 DIAGNOSIS — E1110 Type 2 diabetes mellitus with ketoacidosis without coma: Secondary | ICD-10-CM | POA: Diagnosis present

## 2020-04-21 DIAGNOSIS — F17210 Nicotine dependence, cigarettes, uncomplicated: Secondary | ICD-10-CM | POA: Insufficient documentation

## 2020-04-21 DIAGNOSIS — E870 Hyperosmolality and hypernatremia: Secondary | ICD-10-CM

## 2020-04-21 LAB — URINALYSIS, ROUTINE W REFLEX MICROSCOPIC
Bacteria, UA: NONE SEEN
Bilirubin Urine: NEGATIVE
Glucose, UA: >=500 mg/dL — AB
Hgb urine dipstick: NEGATIVE
Ketones, ur: 20 mg/dL — AB
Leukocytes,Ua: NEGATIVE
Nitrite: NEGATIVE
Protein, ur: NEGATIVE mg/dL
Specific Gravity, Urine: 1.017 (ref 1.005–1.030)
pH: 5 (ref 5.0–8.0)

## 2020-04-21 LAB — CBG MONITORING, ED: Glucose-Capillary: >600 mg/dL (ref 70–99)

## 2020-04-21 LAB — BLOOD GAS, VENOUS
Acid-base deficit: 7.1 mmol/L — ABNORMAL HIGH (ref 0.0–2.0)
Bicarbonate: 18.5 mmol/L — ABNORMAL LOW (ref 20.0–28.0)
O2 Saturation: 67.6 %
Patient temperature: 98.6
pCO2, Ven: 39.6 mmHg — ABNORMAL LOW (ref 44.0–60.0)
pH, Ven: 7.291 (ref 7.250–7.430)
pO2, Ven: 38.9 mmHg (ref 32.0–45.0)

## 2020-04-21 LAB — CBC
HCT: 34.4 % — ABNORMAL LOW (ref 39.0–52.0)
Hemoglobin: 11.2 g/dL — ABNORMAL LOW (ref 13.0–17.0)
MCH: 30.4 pg (ref 26.0–34.0)
MCHC: 32.6 g/dL (ref 30.0–36.0)
MCV: 93.2 fL (ref 80.0–100.0)
Platelets: 172 10*3/uL (ref 150–400)
RBC: 3.69 MIL/uL — ABNORMAL LOW (ref 4.22–5.81)
RDW: 14.1 % (ref 11.5–15.5)
WBC: 4 10*3/uL (ref 4.0–10.5)
nRBC: 0 % (ref 0.0–0.2)

## 2020-04-21 LAB — BASIC METABOLIC PANEL
Anion gap: 15 (ref 5–15)
BUN: 23 mg/dL — ABNORMAL HIGH (ref 6–20)
CO2: 17 mmol/L — ABNORMAL LOW (ref 22–32)
Calcium: 8.5 mg/dL — ABNORMAL LOW (ref 8.9–10.3)
Chloride: 93 mmol/L — ABNORMAL LOW (ref 98–111)
Creatinine, Ser: 1.96 mg/dL — ABNORMAL HIGH (ref 0.61–1.24)
GFR calc Af Amer: 50 mL/min — ABNORMAL LOW (ref >60)
GFR calc non Af Amer: 43 mL/min — ABNORMAL LOW (ref >60)
Glucose, Bld: 1092 mg/dL (ref 70–99)
Potassium: 6.6 mmol/L (ref 3.5–5.1)
Sodium: 125 mmol/L — ABNORMAL LOW (ref 135–145)

## 2020-04-21 LAB — BETA-HYDROXYBUTYRIC ACID: Beta-Hydroxybutyric Acid: 3.67 mmol/L — ABNORMAL HIGH (ref 0.05–0.27)

## 2020-04-21 MED ORDER — SODIUM ZIRCONIUM CYCLOSILICATE 5 G PO PACK
5.0000 g | PACK | Freq: Once | ORAL | Status: AC
  Administered 2020-04-22: 5 g via ORAL
  Filled 2020-04-21: qty 1

## 2020-04-21 MED ORDER — DEXTROSE 50 % IV SOLN: 0.0000 mL | INTRAVENOUS | Status: DC | PRN

## 2020-04-21 MED ORDER — INSULIN REGULAR(HUMAN) IN NACL 100-0.9 UT/100ML-% IV SOLN
INTRAVENOUS | Status: DC
  Administered 2020-04-21: 9.5 [IU]/h via INTRAVENOUS
  Filled 2020-04-21: qty 100

## 2020-04-21 MED ORDER — LACTATED RINGERS IV BOLUS
20.0000 mL/kg | Freq: Once | INTRAVENOUS | Status: AC
  Administered 2020-04-21: 2268 mL via INTRAVENOUS

## 2020-04-21 MED ORDER — DEXTROSE-NACL 5-0.45 % IV SOLN: INTRAVENOUS | Status: DC

## 2020-04-21 MED ORDER — SODIUM CHLORIDE 0.9 % IV SOLN: INTRAVENOUS | Status: DC

## 2020-04-21 NOTE — H&P (Signed)
History and Physical    Mark [name] LSL:[id number] DOB: 1986-10-21 DOA: 04/21/2020  PCP: Patient, No Pcp Per   Patient coming from: Home   Chief Complaint: Nausea, malaise, high sugars   HPI: ELIUS ETHEREDGE is a 34 y.o. male with medical history significant for type 1 diabetes mellitus, hypothyroidism, bipolar disorder, and recent admission with DKA, now returning to the emergency department with hyperglycemia, nausea, and malaise after going without his short acting insulin for several days.  Patient was discharged from the hospital on 04/12/2020 after admission for DKA, reports that he has insulin prescription but does not have any money and was waiting for his roommate to get paid so he can borrow money for the insulin.  He has had worsening hyperglycemia and now developing nausea and malaise.  He denies fevers, chills, chest pain, focal numbness or weakness, vomiting, or diarrhea.  ED Course: Upon arrival to the ED, patient is found to be saturating well on room air, tachypneic in the upper 20s, slightly tachycardic, and with stable blood pressure.  EKG features sinus tachycardia with rate 107 and peaked T waves.  Chemistry panel notable for glucose of 1092, potassium 6.6, bicarbonate 17, and creatinine 1.96, from 0.9 on June 22.  CBC with mild normocytic anemia.  Urinalysis with glucosuria and ketonuria.  COVID-19 screening test not yet resulted.  Patient was given 2.3 L of IV fluids and was started on insulin infusion.   Review of Systems:  All other systems reviewed and apart from HPI, are negative.  Past Medical History:  Diagnosis Date  . Anxiety   . Bipolar disorder (HCC)   . Depression   . Diabetes mellitus without complication (HCC)   . Diabetic polyneuropathy associated with type 1 diabetes mellitus (HCC)   . Hydrocephalus (HCC)   . Kidney stones   . Seizures (HCC)     Past Surgical History:  Procedure Laterality Date  . KIDNEY STONE SURGERY       reports that he  has been smoking cigarettes. He has a 20.00 pack-year smoking history. He has never used smokeless tobacco. He reports current alcohol use. He reports current drug use. Frequency: 7.00 times per week. Drug: Marijuana.  Allergies  Allergen Reactions  . Mushroom Extract Complex Hives, Itching and Nausea And Vomiting  . Penicillins Anaphylaxis, Hives and Swelling    Has patient had a PCN reaction causing immediate rash, facial/tongue/throat swelling, SOB or lightheadedness with hypotension: Yes Has patient had a PCN reaction causing severe rash involving mucus membranes or skin necrosis: No Has patient had a PCN reaction that required hospitalization: Yes Has patient had a PCN reaction occurring within the last 10 years: Yes If all of the above answers are "NO", then may proceed with Cephalosporin use. Tolerated ceftriaxone 03/24/20  . Sulfa Antibiotics Anaphylaxis and Hives  . Clindamycin/Lincomycin Hives    Family History  Problem Relation Age of Onset  . Breast cancer Mother      Prior to Admission medications   Medication Sig Start Date End Date Taking? Authorizing Provider  escitalopram (LEXAPRO) 20 MG tablet Take 1 tablet (20 mg total) by mouth daily. For depression 04/12/20  Yes Osvaldo Shipper, MD  levothyroxine (SYNTHROID) 100 MCG tablet Take 1 tablet (100 mcg total) by mouth daily before breakfast. 04/12/20  Yes Osvaldo Shipper, MD  QUEtiapine (SEROQUEL) 300 MG tablet Take 1 tablet (300 mg total) by mouth at bedtime. For mood control 04/12/20  Yes Osvaldo Shipper, MD  atorvastatin (LIPITOR) 20 MG  tablet Take 1 tablet (20 mg total) by mouth daily. 04/12/20   Osvaldo Shipper, MD  divalproex (DEPAKOTE) 250 MG DR tablet Take 3 tablets (750 mg total) by mouth every evening. For mood stabilization 04/12/20   Osvaldo Shipper, MD  divalproex (DEPAKOTE) 500 MG DR tablet Take 1 tablet (500 mg total) by mouth daily. For mood stabilization 04/12/20   Osvaldo Shipper, MD  gabapentin (NEURONTIN) 100  MG capsule Take 100 mg by mouth 2 (two) times daily.    [provider]  insulin aspart protamine- aspart (NOVOLOG MIX 70/30) (70-30) 100 UNIT/ML injection Inject 30 Units into the skin in the morning, at noon, and at bedtime.    [provider]  insulin detemir (LEVEMIR) 100 UNIT/ML FlexPen Inject 25 Units into the skin at bedtime. Patient taking differently: Inject 32 Units into the skin at bedtime.  04/12/20   Osvaldo Shipper, MD  insulin aspart (NOVOLOG) 100 UNIT/ML injection Inject 10 Units into the skin 3 (three) times daily with meals. Patient not taking: Reported on 03/23/2020 03/13/20 04/04/20  Lanae Boast, MD  pregabalin (LYRICA) 150 MG capsule Take 1 capsule (150 mg total) by mouth 2 (two) times daily. Patient not taking: Reported on 03/23/2020 12/31/19 04/04/20  Dimple Nanas, MD    Physical Exam: Vitals:   23-Apr-2020 2214 04-23-2020 2308 04-23-2020 2312 2020-04-23 2358  BP:  97/60 97/60 124/73  Pulse:  (!) 106 (!) 106 (!) 108  Resp:  (!) 28 (!) 30 (!) 26  SpO2:  99% 99% 95%  Weight: 113.4 kg     Height: 6\' 2"  (1.88 m)       Constitutional: NAD, calm  Eyes: PERTLA, lids and conjunctivae normal ENMT: Mucous membranes are moist. Posterior pharynx clear of any exudate or lesions.   Neck: normal, supple, no masses, no thyromegaly Respiratory: tachypneic, no wheezing, no crackles. No accessory muscle use.  Cardiovascular: Rate ~110 and regular. No extremity edema.   Abdomen: No distension, no tenderness, soft. Bowel sounds active.  Musculoskeletal: no clubbing / cyanosis. No joint deformity upper and lower extremities.   Skin: no significant rashes, lesions, ulcers. Warm, dry, well-perfused. Neurologic: CN 2-12 grossly intact. Sensation intact. Moving all extremities.  Psychiatric: Alert and oriented to person, place, and situation. Calm and cooperative.    Labs and Imaging on Admission: I have personally reviewed following labs and imaging studies  CBC: Recent Labs    Lab 2020/04/23 2226  WBC 4.0  HGB 11.2*  HCT 34.4*  MCV 93.2  PLT 172   Basic Metabolic Panel: Recent Labs  Lab 04/23/20 2226  NA 125*  K 6.6*  CL 93*  CO2 17*  GLUCOSE 1,092*  BUN 23*  CREATININE 1.96*  CALCIUM 8.5*   GFR: Estimated Creatinine Clearance: 69.8 mL/min (A) (by C-G formula based on SCr of 1.96 mg/dL (H)). Liver Function Tests: No results for input(s): AST, ALT, ALKPHOS, BILITOT, PROT, ALBUMIN in the last 168 hours. No results for input(s): LIPASE, AMYLASE in the last 168 hours. No results for input(s): AMMONIA in the last 168 hours. Coagulation Profile: No results for input(s): INR, PROTIME in the last 168 hours. Cardiac Enzymes: No results for input(s): CKTOTAL, CKMB, CKMBINDEX, TROPONINI in the last 168 hours. BNP (last 3 results) No results for input(s): PROBNP in the last 8760 hours. HbA1C: No results for input(s): HGBA1C in the last 72 hours. CBG: Recent Labs  Lab 04/23/2020 2215  GLUCAP >600*   Lipid Profile: No results for input(s): CHOL, HDL, LDLCALC, TRIG,  CHOLHDL, LDLDIRECT in the last 72 hours. Thyroid Function Tests: No results for input(s): TSH, T4TOTAL, FREET4, T3FREE, THYROIDAB in the last 72 hours. Anemia Panel: No results for input(s): VITAMINB12, FOLATE, FERRITIN, TIBC, IRON, RETICCTPCT in the last 72 hours. Urine analysis:    Component Value Date/Time   COLORURINE STRAW (A) 04/21/2020 2226   APPEARANCEUR CLEAR 04/21/2020 2226   APPEARANCEUR Clear 01/20/2015 2136   LABSPEC 1.017 04/21/2020 2226   LABSPEC 1.023 01/20/2015 2136   PHURINE 5.0 04/21/2020 2226   GLUCOSEU >=500 (A) 04/21/2020 2226   GLUCOSEU >=500 01/20/2015 2136   HGBUR NEGATIVE 04/21/2020 2226   BILIRUBINUR NEGATIVE 04/21/2020 2226   BILIRUBINUR Negative 01/20/2015 2136   KETONESUR 20 (A) 04/21/2020 2226   PROTEINUR NEGATIVE 04/21/2020 2226   NITRITE NEGATIVE 04/21/2020 2226   LEUKOCYTESUR NEGATIVE 04/21/2020 2226   LEUKOCYTESUR Negative 01/20/2015 2136    Sepsis Labs: @LABRCNTIP (procalcitonin:4,lacticidven:4) )No results found for this or any previous visit (from the past 240 hour(s)).   Radiological Exams on Admission: No results found.  EKG: Independently reviewed. Sinus tachycardia (rate 107), mild peaking of T-waves.   Assessment/Plan   1. DKA; type I DM  - Presents with nausea, malaise, and hyperglycemia after running out of short-acting insulin due to financial constraints and is found to be in DKA with serum glucose 1092, bicarbonate 17, and urine ketones  - He fluid-resuscitated in ED and started on insulin infusion  - Continue IVF hydration and insulin infusion with frequent CBGs and serial chem panels   2. Hyperkalemia; AKI  - Serum potassium is 6.3 in ED and SCr 1.96, up from 0.9 on 04/12/20 - Hyperkalemia likely secondary to insulin deficiency, AKI likely acute prerenal azotemia in setting of osmotic diuresis and both expected to normalize with IVF hydration and insulin infusion  - Continue cardiac monitoring, continue IVF and insulin infusions, follow serial chem panels    3. Bipolar disorder  - Appears stable, calm and cooperative in ED, continue Seroquel, Depakote, and Lexapro     4. Hypothyroidism  - Continue Synthroid   5. Non-compliance  - Patient has frequent admissions for DKA, had potassium of 7.1 on recent admission, and critically-high again today and we discussed that this is life-threatening  - Patient reports non-compliance is due to financial constraints and notes that he is waiting for his roommate to get paid so he can borrow money for insulin but does seems to lack insight into the gravity of the situation   - Consult TOC to see if there are any resources they can offer    DVT prophylaxis: Lovenox  Code Status: Full  Family Communication: Discussed with patient Disposition Plan:  Patient is from: Home  Anticipated d/c is to: TBD  Anticipated d/c date is: 04/24/20 Patient currently: Critically-ill  with DKA  Consults called: None  Admission status: Inpatient     06/25/20, MD [facility] Pager: See [email or web address]  If 7AM-7PM, please contact the daytime attending [email or web address]  04/22/2020, 12:08 AM

## 2020-04-21 NOTE — ED Triage Notes (Signed)
Pt sts he has been without insulin for days. EMS meter read high

## 2020-04-22 DIAGNOSIS — F314 Bipolar disorder, current episode depressed, severe, without psychotic features: Secondary | ICD-10-CM | POA: Diagnosis not present

## 2020-04-22 DIAGNOSIS — N179 Acute kidney failure, unspecified: Secondary | ICD-10-CM | POA: Diagnosis not present

## 2020-04-22 DIAGNOSIS — E1010 Type 1 diabetes mellitus with ketoacidosis without coma: Secondary | ICD-10-CM | POA: Diagnosis not present

## 2020-04-22 DIAGNOSIS — E875 Hyperkalemia: Secondary | ICD-10-CM | POA: Diagnosis not present

## 2020-04-22 LAB — BASIC METABOLIC PANEL
Anion gap: 17 — ABNORMAL HIGH (ref 5–15)
Anion gap: 12 (ref 5–15)
Anion gap: 11 (ref 5–15)
BUN: 19 mg/dL (ref 6–20)
BUN: 14 mg/dL (ref 6–20)
BUN: 11 mg/dL (ref 6–20)
CO2: 17 mmol/L — ABNORMAL LOW (ref 22–32)
CO2: 23 mmol/L (ref 22–32)
CO2: 23 mmol/L (ref 22–32)
Calcium: 9.3 mg/dL (ref 8.9–10.3)
Calcium: 8.8 mg/dL — ABNORMAL LOW (ref 8.9–10.3)
Calcium: 8.4 mg/dL — ABNORMAL LOW (ref 8.9–10.3)
Chloride: 101 mmol/L (ref 98–111)
Chloride: 101 mmol/L (ref 98–111)
Chloride: 101 mmol/L (ref 98–111)
Creatinine, Ser: 1.46 mg/dL — ABNORMAL HIGH (ref 0.61–1.24)
Creatinine, Ser: 1.09 mg/dL (ref 0.61–1.24)
Creatinine, Ser: 1.01 mg/dL (ref 0.61–1.24)
GFR calc Af Amer: >60 mL/min (ref >60)
GFR calc Af Amer: >60 mL/min (ref >60)
GFR calc Af Amer: >60 mL/min (ref >60)
GFR calc non Af Amer: >60 mL/min (ref >60)
GFR calc non Af Amer: >60 mL/min (ref >60)
GFR calc non Af Amer: >60 mL/min (ref >60)
Glucose, Bld: 392 mg/dL — ABNORMAL HIGH (ref 70–99)
Glucose, Bld: 172 mg/dL — ABNORMAL HIGH (ref 70–99)
Glucose, Bld: 231 mg/dL — ABNORMAL HIGH (ref 70–99)
Potassium: 4.1 mmol/L (ref 3.5–5.1)
Potassium: 3.2 mmol/L — ABNORMAL LOW (ref 3.5–5.1)
Potassium: 3.3 mmol/L — ABNORMAL LOW (ref 3.5–5.1)
Sodium: 135 mmol/L (ref 135–145)
Sodium: 136 mmol/L (ref 135–145)
Sodium: 135 mmol/L (ref 135–145)

## 2020-04-22 LAB — CBC WITH DIFFERENTIAL/PLATELET
Abs Immature Granulocytes: 0.02 10*3/uL (ref 0.00–0.07)
Basophils Absolute: 0 10*3/uL (ref 0.0–0.1)
Basophils Relative: 0 %
Eosinophils Absolute: 0 10*3/uL (ref 0.0–0.5)
Eosinophils Relative: 1 %
HCT: 36.3 % — ABNORMAL LOW (ref 39.0–52.0)
Hemoglobin: 11.9 g/dL — ABNORMAL LOW (ref 13.0–17.0)
Immature Granulocytes: 0 %
Lymphocytes Relative: 26 %
Lymphs Abs: 1.3 10*3/uL (ref 0.7–4.0)
MCH: 29.6 pg (ref 26.0–34.0)
MCHC: 32.8 g/dL (ref 30.0–36.0)
MCV: 90.3 fL (ref 80.0–100.0)
Monocytes Absolute: 0.5 10*3/uL (ref 0.1–1.0)
Monocytes Relative: 11 %
Neutro Abs: 3.2 10*3/uL (ref 1.7–7.7)
Neutrophils Relative %: 62 %
Platelets: 203 10*3/uL (ref 150–400)
RBC: 4.02 MIL/uL — ABNORMAL LOW (ref 4.22–5.81)
RDW: 14.2 % (ref 11.5–15.5)
WBC: 5.1 10*3/uL (ref 4.0–10.5)
nRBC: 0 % (ref 0.0–0.2)

## 2020-04-22 LAB — MRSA PCR SCREENING: MRSA by PCR: NEGATIVE

## 2020-04-22 LAB — GLUCOSE, CAPILLARY
Glucose-Capillary: 418 mg/dL — ABNORMAL HIGH (ref 70–99)
Glucose-Capillary: 456 mg/dL — ABNORMAL HIGH (ref 70–99)
Glucose-Capillary: 332 mg/dL — ABNORMAL HIGH (ref 70–99)
Glucose-Capillary: 228 mg/dL — ABNORMAL HIGH (ref 70–99)
Glucose-Capillary: 162 mg/dL — ABNORMAL HIGH (ref 70–99)
Glucose-Capillary: 139 mg/dL — ABNORMAL HIGH (ref 70–99)
Glucose-Capillary: 164 mg/dL — ABNORMAL HIGH (ref 70–99)
Glucose-Capillary: 165 mg/dL — ABNORMAL HIGH (ref 70–99)
Glucose-Capillary: 198 mg/dL — ABNORMAL HIGH (ref 70–99)
Glucose-Capillary: 259 mg/dL — ABNORMAL HIGH (ref 70–99)
Glucose-Capillary: 233 mg/dL — ABNORMAL HIGH (ref 70–99)
Glucose-Capillary: 217 mg/dL — ABNORMAL HIGH (ref 70–99)

## 2020-04-22 LAB — PHOSPHORUS: Phosphorus: 3.7 mg/dL (ref 2.5–4.6)

## 2020-04-22 LAB — CBG MONITORING, ED: Glucose-Capillary: >600 mg/dL (ref 70–99)

## 2020-04-22 LAB — BETA-HYDROXYBUTYRIC ACID
Beta-Hydroxybutyric Acid: 1.28 mmol/L — ABNORMAL HIGH (ref 0.05–0.27)
Beta-Hydroxybutyric Acid: 0.6 mmol/L — ABNORMAL HIGH (ref 0.05–0.27)

## 2020-04-22 LAB — CREATININE, URINE, RANDOM: Creatinine, Urine: 14.27 mg/dL

## 2020-04-22 LAB — MAGNESIUM: Magnesium: 1.6 mg/dL — ABNORMAL LOW (ref 1.7–2.4)

## 2020-04-22 LAB — SARS CORONAVIRUS 2 BY RT PCR (HOSPITAL ORDER, PERFORMED IN ~~LOC~~ HOSPITAL LAB): SARS Coronavirus 2: NEGATIVE

## 2020-04-22 LAB — SODIUM, URINE, RANDOM: Sodium, Ur: 53 mmol/L

## 2020-04-22 MED ORDER — INSULIN ASPART 100 UNIT/ML ~~LOC~~ SOLN: 0.0000 [IU] | Freq: Every day | SUBCUTANEOUS | Status: DC

## 2020-04-22 MED ORDER — MAGNESIUM SULFATE 2 GM/50ML IV SOLN
2.0000 g | Freq: Once | INTRAVENOUS | Status: AC
  Administered 2020-04-22: 2 g via INTRAVENOUS
  Filled 2020-04-22: qty 50

## 2020-04-22 MED ORDER — DEXTROSE-NACL 5-0.45 % IV SOLN: INTRAVENOUS | Status: DC

## 2020-04-22 MED ORDER — QUETIAPINE FUMARATE 100 MG PO TABS
300.0000 mg | ORAL_TABLET | Freq: Every day | ORAL | Status: DC
  Administered 2020-04-22: 300 mg via ORAL
  Filled 2020-04-22: qty 3

## 2020-04-22 MED ORDER — INSULIN DETEMIR 100 UNIT/ML FLEXPEN
30.0000 [IU] | PEN_INJECTOR | Freq: Every day | SUBCUTANEOUS | 3 refills | Status: DC
Start: 2020-04-22 — End: 2020-06-26

## 2020-04-22 MED ORDER — POTASSIUM CHLORIDE CRYS ER 20 MEQ PO TBCR
40.0000 meq | EXTENDED_RELEASE_TABLET | Freq: Two times a day (BID) | ORAL | Status: DC
  Administered 2020-04-22: 40 meq via ORAL
  Filled 2020-04-22: qty 2

## 2020-04-22 MED ORDER — INSULIN ASPART 100 UNIT/ML ~~LOC~~ SOLN
0.0000 [IU] | Freq: Three times a day (TID) | SUBCUTANEOUS | Status: DC
  Administered 2020-04-22: 3 [IU] via SUBCUTANEOUS

## 2020-04-22 MED ORDER — ATORVASTATIN CALCIUM 10 MG PO TABS
20.0000 mg | ORAL_TABLET | Freq: Every day | ORAL | Status: DC
  Administered 2020-04-22: 20 mg via ORAL
  Filled 2020-04-22: qty 2

## 2020-04-22 MED ORDER — DEXTROSE 50 % IV SOLN: 0.0000 mL | INTRAVENOUS | Status: DC | PRN

## 2020-04-22 MED ORDER — SODIUM CHLORIDE 0.9 % IV SOLN: INTRAVENOUS | Status: DC

## 2020-04-22 MED ORDER — LEVOTHYROXINE SODIUM 100 MCG PO TABS
100.0000 ug | ORAL_TABLET | Freq: Every day | ORAL | Status: DC
  Administered 2020-04-22: 100 ug via ORAL
  Filled 2020-04-22: qty 1

## 2020-04-22 MED ORDER — ENOXAPARIN SODIUM 40 MG/0.4ML ~~LOC~~ SOLN
40.0000 mg | SUBCUTANEOUS | Status: DC
  Administered 2020-04-22: 40 mg via SUBCUTANEOUS
  Filled 2020-04-22: qty 0.4

## 2020-04-22 MED ORDER — PEN NEEDLES 31G X 5 MM MISC: 1.0000 | Freq: Three times a day (TID) | 0 refills | Status: DC

## 2020-04-22 MED ORDER — GABAPENTIN 100 MG PO CAPS
100.0000 mg | ORAL_CAPSULE | Freq: Two times a day (BID) | ORAL | Status: DC
  Administered 2020-04-22 (×2): 100 mg via ORAL
  Filled 2020-04-22 (×2): qty 1

## 2020-04-22 MED ORDER — CHLORHEXIDINE GLUCONATE CLOTH 2 % EX PADS
6.0000 | MEDICATED_PAD | Freq: Every day | CUTANEOUS | Status: DC
  Administered 2020-04-22 (×2): 6 via TOPICAL

## 2020-04-22 MED ORDER — CHLORHEXIDINE GLUCONATE 0.12 % MT SOLN
15.0000 mL | Freq: Two times a day (BID) | OROMUCOSAL | Status: DC
  Administered 2020-04-22: 15 mL via OROMUCOSAL

## 2020-04-22 MED ORDER — DIVALPROEX SODIUM 500 MG PO DR TAB
500.0000 mg | DELAYED_RELEASE_TABLET | Freq: Every day | ORAL | Status: DC
  Administered 2020-04-22: 500 mg via ORAL
  Filled 2020-04-22: qty 1

## 2020-04-22 MED ORDER — INSULIN DETEMIR 100 UNIT/ML ~~LOC~~ SOLN
20.0000 [IU] | Freq: Every day | SUBCUTANEOUS | Status: DC
  Administered 2020-04-22: 20 [IU] via SUBCUTANEOUS
  Filled 2020-04-22: qty 0.2

## 2020-04-22 MED ORDER — NOVOLIN R FLEXPEN 100 UNIT/ML IJ SOPN: 10.0000 [IU] | PEN_INJECTOR | Freq: Three times a day (TID) | INTRAMUSCULAR | 0 refills | Status: DC

## 2020-04-22 MED ORDER — INSULIN ASPART 100 UNIT/ML ~~LOC~~ SOLN
10.0000 [IU] | Freq: Three times a day (TID) | SUBCUTANEOUS | Status: DC
  Administered 2020-04-22: 10 [IU] via SUBCUTANEOUS

## 2020-04-22 MED ORDER — BLOOD GLUCOSE MONITOR KIT: PACK | 0 refills | Status: DC

## 2020-04-22 MED ORDER — ESCITALOPRAM OXALATE 20 MG PO TABS
20.0000 mg | ORAL_TABLET | Freq: Every day | ORAL | Status: DC
  Administered 2020-04-22: 20 mg via ORAL
  Filled 2020-04-22: qty 1

## 2020-04-22 MED ORDER — INSULIN REGULAR(HUMAN) IN NACL 100-0.9 UT/100ML-% IV SOLN
INTRAVENOUS | Status: DC
  Administered 2020-04-22: 6 [IU]/h via INTRAVENOUS

## 2020-04-22 MED ORDER — ORAL CARE MOUTH RINSE: 15.0000 mL | Freq: Two times a day (BID) | OROMUCOSAL | Status: DC

## 2020-04-22 MED ORDER — DIVALPROEX SODIUM 500 MG PO DR TAB
750.0000 mg | DELAYED_RELEASE_TABLET | Freq: Every evening | ORAL | Status: DC
  Filled 2020-04-22: qty 1

## 2020-04-22 NOTE — Progress Notes (Signed)
Inpatient Diabetes Program Recommendations  AACE/ADA: New Consensus Statement on Inpatient Glycemic Control (2015)  Target Ranges:  Prepandial:   less than 140 mg/dL      Peak postprandial:   less than 180 mg/dL (1-2 hours)      Critically ill patients:  140 - 180 mg/dL   Lab Results  Component Value Date   GLUCAP 233 (H) 04/22/2020   HGBA1C 8.8 (H) 03/23/2020    Review of Glycemic Control  Diabetes history: DM1 Outpatient Diabetes medications: Levemir 32 units QHS, 70/30 30 units tidwc Current orders for Inpatient glycemic control: IV insulin transitioning to Levemir 20 units QD and Novolog 0-9 units tidwc and hs + 10 units tidwc  Inpatient Diabetes Program Recommendations:     D/C 70/30 insulin at home. Levemir 30 units QHS - if FBS > 180 mg/dL, call PCP to increase Levemir dose. Novolog 10 units tidwc if eating meal  Spoke with pt at length regarding his glycemic control and HgbA1C of 8.8%. Pt states he runs out of insulin d/t lack of transportation to pharmacy and lack of money. Suggested pt f/u with [name] and he would be able to get meds there on a reduced cost.   7 admissions and 5 ED visits in past 6 months.  Discussed above with RN and MD.  Thank you. Ailene Ards, RD, LDN, CDE Inpatient Diabetes Coordinator [phone]

## 2020-04-22 NOTE — Discharge Summary (Signed)
Physician Discharge Summary  Mark [name] MRN:[id number] DOB: 11/08/1985 DOA: 04/21/2020  PCP: Patient, No Pcp Per  Admit date: 04/21/2020 Discharge date: 04/22/2020  Admitted From: Home Disposition: Home  Recommendations for Outpatient Follow-up:  1. Follow up with PCP in 1-2 weeks 2. Please obtain CMP/CBC, Mag, Phos in one week 3. Please follow up on the following pending results:  Home Health: No Equipment/Devices: None  Discharge Condition: Stable CODE STATUS: FULL CODE Diet recommendation: Carb Modified Diet  Brief/Interim Summary: HPI per Dr. Timothy Opyd on 04/22/20 Mark [name] is a 34 y.o. male with medical history significant for type 1 diabetes mellitus, hypothyroidism, bipolar disorder, and recent admission with DKA, now returning to the emergency department with hyperglycemia, nausea, and malaise after going without his short acting insulin for several days.  Patient was discharged from the hospital on 04/12/2020 after admission for DKA, reports that he has insulin prescription but does not have any money and was waiting for his roommate to get paid so he can borrow money for the insulin.  He has had worsening hyperglycemia and now developing nausea and malaise.  He denies fevers, chills, chest pain, focal numbness or weakness, vomiting, or diarrhea.  ED Course: Upon arrival to the ED, patient is found to be saturating well on room air, tachypneic in the upper 20s, slightly tachycardic, and with stable blood pressure.  EKG features sinus tachycardia with rate 107 and peaked T waves.  Chemistry panel notable for glucose of 1092, potassium 6.6, bicarbonate 17, and creatinine 1.96, from 0.9 on June 22.  CBC with mild normocytic anemia.  Urinalysis with glucosuria and ketonuria.  COVID-19 screening test not yet resulted.  Patient was given 2.3 L of IV fluids and was started on insulin infusion.   **Interim History Patient's gap closed and he was transitioned off the insulin drip  and to long-acting insulin and he tolerated his meal without issues.  His CBGs were better controlled and he was placed on Levemir and also discharged on Novolin R.  Diabetes education coronary evaluated prior to discharge and patient was given strict adherence to maintain his insulin regimen and follow-up with his PCP.  Currently since his gap is closed and because he is no longer nauseous and vomiting he is stable to be discharged home and will need to follow-up with his PCP as well as diabetes education coordinator and/or endocrinology in the outpatient setting.  Discharge Diagnoses:  Principal Problem:   DKA (diabetic ketoacidoses) (HCC) Active Problems:   Bipolar I disorder, most recent episode depressed, severe without psychotic features (HCC)   Hypothyroidism   Hyperkalemia   AKI (acute kidney injury) (HCC)  DKA; type I DM  - Presents with nausea, malaise, and hyperglycemia after running out of short-acting insulin due to financial constraints and is found to be in DKA with serum glucose 1092, bicarbonate 17, and urine ketones  - He fluid-resuscitated in ED and started on insulin infusion  - Continue IVF hydration and insulin infusion with frequent CBGs and serial chem panels  -His anion gap closed and his last BMP showed a CO2 23, anion gap of 11, chloride level of 100 1 -He was transitioned to long-acting insulin at 20 units of Levemir daily and he will go to titrate to 30 units daily at discharge.  We will also send the patient on 10 units of Novolin R at discharge and have him follow-up with his PCP and discontinue 70/30 mix that he has been taking -As hemoglobin A1c   was done not too long ago and was 8.8 -CBGs ranging from 139-259 and the last CBG prior to discharge was 217 -Further care per PCP and endocrinology  Hyperkalemia; AKI. Improved  - Serum potassium is 6.3 in ED and SCr 1.96, up from 0.9 on 04/12/20 - Hyperkalemia likely secondary to insulin deficiency, AKI likely acute  prerenal azotemia in setting of osmotic diuresis and both expected to normalize with IVF hydration and insulin infusion  and they did - Continue cardiac monitoring, continue IVF and insulin infusions, follow serial chem panels   -IV fluid hydration is stopped and his BUN/creatinine had improved to 11/1.01 next-potassium had dropped to 3.3 but was repleted prior to discharge  Bipolar Disorder  -Appears stable, calm and cooperative in ED, continue Seroquel, Depakote, and Lexapro     Hypothyroidism  -Continue Levothyroxine 100 mcg po Daily   Non-compliance  -Patient has frequent admissions for DKA, had potassium of 7.1 on recent admission, and critically-high again today and we discussed that this is life-threatening  -Patient reports non-compliance is due to financial constraints and notes that he is waiting for his roommate to get paid so he can borrow money for insulin but does seems to lack insight into the gravity of the situation   -Consult TOC to see if there are any resources they can offer; will discharge on [employer] insulin so it is more affordable and he will need to follow-up with his PCP and community health and wellness  Hypokalemia -Patient's potassium was 3.3 next-replete with p.o. potassium chloride 40 mg twice daily -Continue to Monitor and replete as necessary -Repeat CMP within 1 week  Hypomagnesemia -Patient magnesium level is 1.6 -Replete with IV mag sulfate 2 g prior to discharge  -Continue monitor and replete as necessary -Repeat magnesium level within 1 week  Normocytic Anemia  -Patient's hemoglobin/hematocrit is now 11.9/36.3  -Check anemia panel in outpatient setting  -Continue to monitor for signs and symptoms of bleeding; no overt bleeding noted  -Repeat CBC within 1 week  Discharge Instructions Discharge Instructions    Call MD for:  difficulty breathing, headache or visual disturbances   Complete by: As directed    Call MD for:  extreme fatigue    Complete by: As directed    Call MD for:  hives   Complete by: As directed    Call MD for:  persistant dizziness or light-headedness   Complete by: As directed    Call MD for:  persistant nausea and vomiting   Complete by: As directed    Call MD for:  redness, tenderness, or signs of infection (pain, swelling, redness, odor or green/yellow discharge around incision site)   Complete by: As directed    Call MD for:  severe uncontrolled pain   Complete by: As directed    Call MD for:  temperature >100.4   Complete by: As directed    Diet - low sodium heart healthy   Complete by: As directed    Diet Carb Modified   Complete by: As directed    Discharge instructions   Complete by: As directed    You were cared for by a hospitalist during your hospital stay. If you have any questions about your discharge medications or the care you received while you were in the hospital after you are discharged, you can call the unit and ask to speak with the hospitalist on call if the hospitalist that took care of you is not available. Once you are discharged, your  primary care physician will handle any further medical issues. Please note that NO REFILLS for any discharge medications will be authorized once you are discharged, as it is imperative that you return to your primary care physician (or establish a relationship with a primary care physician if you do not have one) for your aftercare needs so that they can reassess your need for medications and monitor your lab values.  Follow up with PCP and Diabetes Education Coordinator and or Endocrinology as an outpatient. Take all medications as prescribed. If symptoms change or worsen please return to the ED for evaluation   Increase activity slowly   Complete by: As directed      Allergies as of 04/22/2020      Reactions   Mushroom Extract Complex Hives, Itching, Nausea And Vomiting   Penicillins Anaphylaxis, Hives, Swelling   Has patient had a PCN reaction  causing immediate rash, facial/tongue/throat swelling, SOB or lightheadedness with hypotension: Yes Has patient had a PCN reaction causing severe rash involving mucus membranes or skin necrosis: No Has patient had a PCN reaction that required hospitalization: Yes Has patient had a PCN reaction occurring within the last 10 years: Yes If all of the above answers are "NO", then may proceed with Cephalosporin use. Tolerated ceftriaxone 03/24/20   Sulfa Antibiotics Anaphylaxis, Hives   Clindamycin/lincomycin Hives      Medication List    STOP taking these medications   insulin aspart protamine- aspart (70-30) 100 UNIT/ML injection Commonly known as: NOVOLOG MIX 70/30     TAKE these medications   atorvastatin 20 MG tablet Commonly known as: LIPITOR Take 1 tablet (20 mg total) by mouth daily.   blood glucose meter kit and supplies Kit Dispense based on patient and insurance preference. Use up to four times daily as directed. (FOR ICD-9 250.00, 250.01).   divalproex 500 MG DR tablet Commonly known as: DEPAKOTE Take 1 tablet (500 mg total) by mouth daily. For mood stabilization   divalproex 250 MG DR tablet Commonly known as: DEPAKOTE Take 3 tablets (750 mg total) by mouth every evening. For mood stabilization   escitalopram 20 MG tablet Commonly known as: LEXAPRO Take 1 tablet (20 mg total) by mouth daily. For depression   gabapentin 100 MG capsule Commonly known as: NEURONTIN Take 100 mg by mouth 2 (two) times daily.   insulin detemir 100 UNIT/ML FlexPen Commonly known as: LEVEMIR Inject 30 Units into the skin at bedtime. What changed: how much to take   levothyroxine 100 MCG tablet Commonly known as: SYNTHROID Take 1 tablet (100 mcg total) by mouth daily before breakfast.   NovoLIN R FlexPen 100 UNIT/ML Sopn Generic drug: Insulin Regular Human Inject 10 Units as directed with breakfast, with lunch, and with evening meal.   Pen Needles 31G X 5 MM Misc 1 Container by Does  not apply route 4 (four) times daily -  with meals and at bedtime.   QUEtiapine 300 MG tablet Commonly known as: SEROQUEL Take 1 tablet (300 mg total) by mouth at bedtime. For mood control       Follow-up Information    [facility] Follow up on 06/07/2020.   Why: 150pm Tuesday go at 1:30 pm to fill out paper work. Contact information: [address]  [place] [address] [phone]             Allergies  Allergen Reactions  . Mushroom Extract Complex Hives, Itching and Nausea And Vomiting  . Penicillins   Anaphylaxis, Hives and Swelling    Has patient had a PCN reaction causing immediate rash, facial/tongue/throat swelling, SOB or lightheadedness with hypotension: Yes Has patient had a PCN reaction causing severe rash involving mucus membranes or skin necrosis: No Has patient had a PCN reaction that required hospitalization: Yes Has patient had a PCN reaction occurring within the last 10 years: Yes If all of the above answers are "NO", then may proceed with Cephalosporin use. Tolerated ceftriaxone 03/24/20  . Sulfa Antibiotics Anaphylaxis and Hives  . Clindamycin/Lincomycin Hives   Consultations:  Diabetes Education Coordinator  Procedures/Studies: DG Chest 2 View  Result Date: 03/28/2020 CLINICAL DATA:  Left leg redness and swelling. EXAM: CHEST - 2 VIEW COMPARISON:  November 25, 2017 FINDINGS: The heart size and mediastinal contours are within normal limits. Both lungs are clear. The visualized skeletal structures are unremarkable. IMPRESSION: No active cardiopulmonary disease. Electronically Signed   By: Thaddeus  Houston M.D.   On: 03/28/2020 20:32   VAS US LOWER EXTREMITY VENOUS (DVT)  Result Date: 03/25/2020  Lower Venous DVTStudy Indications: Edema, and Pain.  Limitations: Pain tolerance. Comparison Study: no prior Performing Technologist: Megan Riddle RVS  Examination Guidelines: A complete evaluation includes B-mode  imaging, spectral Doppler, color Doppler, and power Doppler as needed of all accessible portions of each vessel. Bilateral testing is considered an integral part of a complete examination. Limited examinations for reoccurring indications may be performed as noted. The reflux portion of the exam is performed with the patient in reverse Trendelenburg.  +-----+---------------+---------+-----------+----------+--------------+ RIGHTCompressibilityPhasicitySpontaneityPropertiesThrombus Aging +-----+---------------+---------+-----------+----------+--------------+ CFV  Full           Yes      Yes                                 +-----+---------------+---------+-----------+----------+--------------+   +---------+---------------+---------+-----------+----------+--------------+ LEFT     CompressibilityPhasicitySpontaneityPropertiesThrombus Aging +---------+---------------+---------+-----------+----------+--------------+ CFV      Full           Yes      Yes                                 +---------+---------------+---------+-----------+----------+--------------+ SFJ      Full                                                        +---------+---------------+---------+-----------+----------+--------------+ FV Prox  Full                                                        +---------+---------------+---------+-----------+----------+--------------+ FV Mid                  Yes      Yes                                 +---------+---------------+---------+-----------+----------+--------------+ FV Distal               Yes      Yes                                 +---------+---------------+---------+-----------+----------+--------------+   PFV      Full                                                        +---------+---------------+---------+-----------+----------+--------------+ POP      Full           Yes      Yes                                  +---------+---------------+---------+-----------+----------+--------------+ PTV      Full                                                        +---------+---------------+---------+-----------+----------+--------------+ PERO     Full                                                        +---------+---------------+---------+-----------+----------+--------------+     Summary: RIGHT: - No evidence of common femoral vein obstruction.  LEFT: - There is no evidence of deep vein thrombosis in the lower extremity.  - No cystic structure found in the popliteal fossa.  *See table(s) above for measurements and observations. Electronically signed by Charles Fields MD on 03/25/2020 at 7:41:36 PM.    Final     Subjective: Seen and examined at bedside he is much improved.  Tolerating his diet without issues.  Gap is closed and he is feeling well.  Has no nausea or vomiting.  Wanted to go home.  No other concerns or complaints at this time.  Discharge Exam: Vitals:   04/22/20 1000 04/22/20 1200  BP: 106/60   Pulse: 94   Resp: (!) 21   Temp:  98 F (36.7 C)  SpO2: 99%    Vitals:   04/22/20 0800 04/22/20 0900 04/22/20 1000 04/22/20 1200  BP: (!) 98/46 115/66 106/60   Pulse: 89 90 94   Resp: 13 14 (!) 21   Temp:    98 F (36.7 C)  TempSrc:    Oral  SpO2: 98% 100% 99%   Weight:      Height:       General: Pt is alert, awake, not in acute distress Cardiovascular: RRR, S1/S2 +, no rubs, no gallops Respiratory: CTA bilaterally, no wheezing, no rhonchi Abdominal: Soft, NT, ND, bowel sounds + Extremities: no edema, no cyanosis  The results of significant diagnostics from this hospitalization (including imaging, microbiology, ancillary and laboratory) are listed below for reference.    Microbiology: Recent Results (from the past 240 hour(s))  SARS Coronavirus 2 by RT PCR (hospital order, performed in [facility] hospital lab) Nasopharyngeal Nasopharyngeal Swab     Status: None   Collection  Time: 04/22/20 12:08 AM   Specimen: Nasopharyngeal Swab  Result Value Ref Range Status   SARS Coronavirus 2 NEGATIVE NEGATIVE Final    Comment: (NOTE) SARS-CoV-2 target nucleic acids are NOT DETECTED.  The SARS-CoV-2 RNA is generally detectable in upper and lower   respiratory specimens during the acute phase of infection. The lowest concentration of SARS-CoV-2 viral copies this assay can detect is 250 copies / mL. A negative result does not preclude SARS-CoV-2 infection and should not be used as the sole basis for treatment or other patient management decisions.  A negative result may occur with improper specimen collection / handling, submission of specimen other than nasopharyngeal swab, presence of viral mutation(s) within the areas targeted by this assay, and inadequate number of viral copies (<250 copies / mL). A negative result must be combined with clinical observations, patient history, and epidemiological information.  Fact Sheet for Patients:   https://www.fda.gov/media/136312/download  Fact Sheet for Healthcare Providers: https://www.fda.gov/media/136313/download  This test is not yet approved or  cleared by the [place] FDA and has been authorized for detection and/or diagnosis of SARS-CoV-2 by FDA under an Emergency Use Authorization (EUA).  This EUA will remain in effect (meaning this test can be used) for the duration of the COVID-19 declaration under Section 564(b)(1) of the Act, 21 U.S.C. section 360bbb-3(b)(1), unless the authorization is terminated or revoked sooner.  Performed at [facility], [address] [place], [place] [address]   MRSA PCR Screening     Status: None   Collection Time: 04/22/20  1:26 AM   Specimen: Nasal Mucosa; Nasopharyngeal  Result Value Ref Range Status   MRSA by PCR NEGATIVE NEGATIVE Final    Comment:        The GeneXpert MRSA Assay (FDA approved for NASAL specimens only), is one component of  a comprehensive MRSA colonization surveillance program. It is not intended to diagnose MRSA infection nor to guide or monitor treatment for MRSA infections. Performed at [facility], [address] , [place] [address]     Labs: BNP (last 3 results) No results for input(s): BNP in the last 8760 hours. Basic Metabolic Panel: Recent Labs  Lab 04/21/20 2226 04/22/20 0224 04/22/20 0700 04/22/20 1041  NA 125* 135 136 135  K 6.6* 4.1 3.2* 3.3*  CL 93* 101 101 101  CO2 17* 17* 23 23  GLUCOSE 1,092* 392* 172* 231*  BUN 23* 19 14 11  CREATININE 1.96* 1.46* 1.09 1.01  CALCIUM 8.5* 9.3 8.8* 8.4*  MG  --   --  1.6*  --   PHOS  --   --  3.7  --    Liver Function Tests: No results for input(s): AST, ALT, ALKPHOS, BILITOT, PROT, ALBUMIN in the last 168 hours. No results for input(s): LIPASE, AMYLASE in the last 168 hours. No results for input(s): AMMONIA in the last 168 hours. CBC: Recent Labs  Lab 04/21/20 2226 04/22/20 0700  WBC 4.0 5.1  NEUTROABS  --  3.2  HGB 11.2* 11.9*  HCT 34.4* 36.3*  MCV 93.2 90.3  PLT 172 203   Cardiac Enzymes: No results for input(s): CKTOTAL, CKMB, CKMBINDEX, TROPONINI in the last 168 hours. BNP: Invalid input(s): POCBNP CBG: Recent Labs  Lab 04/22/20 0728 04/22/20 0836 04/22/20 0945 04/22/20 1036 04/22/20 1145  GLUCAP 165* 198* 259* 233* 217*   D-Dimer No results for input(s): DDIMER in the last 72 hours. Hgb A1c No results for input(s): HGBA1C in the last 72 hours. Lipid Profile No results for input(s): CHOL, HDL, LDLCALC, TRIG, CHOLHDL, LDLDIRECT in the last 72 hours. Thyroid function studies No results for input(s): TSH, T4TOTAL, T3FREE, THYROIDAB in the last 72 hours.  Invalid input(s): FREET3 Anemia work up No results for input(s): VITAMINB12, FOLATE, FERRITIN,   TIBC, IRON, RETICCTPCT in the last 72 hours. Urinalysis    Component Value Date/Time   COLORURINE STRAW (A) 04/21/2020 2226    APPEARANCEUR CLEAR 04/21/2020 2226   APPEARANCEUR Clear 01/20/2015 2136   LABSPEC 1.017 04/21/2020 2226   LABSPEC 1.023 01/20/2015 2136   PHURINE 5.0 04/21/2020 2226   GLUCOSEU >=500 (A) 04/21/2020 2226   GLUCOSEU >=500 01/20/2015 2136   HGBUR NEGATIVE 04/21/2020 2226   BILIRUBINUR NEGATIVE 04/21/2020 2226   BILIRUBINUR Negative 01/20/2015 2136   KETONESUR 20 (A) 04/21/2020 2226   PROTEINUR NEGATIVE 04/21/2020 2226   NITRITE NEGATIVE 04/21/2020 2226   LEUKOCYTESUR NEGATIVE 04/21/2020 2226   LEUKOCYTESUR Negative 01/20/2015 2136   Sepsis Labs Invalid input(s): PROCALCITONIN,  WBC,  LACTICIDVEN Microbiology Recent Results (from the past 240 hour(s))  SARS Coronavirus 2 by RT PCR (hospital order, performed in [facility] hospital lab) Nasopharyngeal Nasopharyngeal Swab     Status: None   Collection Time: 04/22/20 12:08 AM   Specimen: Nasopharyngeal Swab  Result Value Ref Range Status   SARS Coronavirus 2 NEGATIVE NEGATIVE Final    Comment: (NOTE) SARS-CoV-2 target nucleic acids are NOT DETECTED.  The SARS-CoV-2 RNA is generally detectable in upper and lower respiratory specimens during the acute phase of infection. The lowest concentration of SARS-CoV-2 viral copies this assay can detect is 250 copies / mL. A negative result does not preclude SARS-CoV-2 infection and should not be used as the sole basis for treatment or other patient management decisions.  A negative result may occur with improper specimen collection / handling, submission of specimen other than nasopharyngeal swab, presence of viral mutation(s) within the areas targeted by this assay, and inadequate number of viral copies (<250 copies / mL). A negative result must be combined with clinical observations, patient history, and epidemiological information.  Fact Sheet for Patients:   StrictlyIdeas.no  Fact Sheet for Healthcare Providers: BankingDealers.co.za  This  test is not yet approved or  cleared by the [place] FDA and has been authorized for detection and/or diagnosis of SARS-CoV-2 by FDA under an Emergency Use Authorization (EUA).  This EUA will remain in effect (meaning this test can be used) for the duration of the COVID-19 declaration under Section 564(b)(1) of the Act, 21 U.S.C. section 360bbb-3(b)(1), unless the authorization is terminated or revoked sooner.  Performed at [facility], [place] [address], [place], [place] [address]   MRSA PCR Screening     Status: None   Collection Time: 04/22/20  1:26 AM   Specimen: Nasal Mucosa; Nasopharyngeal  Result Value Ref Range Status   MRSA by PCR NEGATIVE NEGATIVE Final    Comment:        The GeneXpert MRSA Assay (FDA approved for NASAL specimens only), is one component of a comprehensive MRSA colonization surveillance program. It is not intended to diagnose MRSA infection nor to guide or monitor treatment for MRSA infections. Performed at [facility], [place] [address], [place], [place] [address]    Time coordinating discharge: 35 minutes  SIGNED:  Kerney Elbe, DO [facility] 04/22/2020, 7:16 PM Pager is on [place]  If 7PM-7AM, please contact night-coverage [email or web address]

## 2020-04-22 NOTE — ED Provider Notes (Signed)
[facility]-EMERGENCY DEPT Provider Note   CSN: [id number] Arrival date & time: 04/21/20  2158     History Chief Complaint  Patient presents with  . Hyperglycemia    Mark [name] is a 34 y.o. man with history of T1DM, insulin non-adherence, anxiety, depression, seizures, bipolar disorder, obstructive hydrocephalus who presents to the ED via EMS with hyperglycemia, polyuria, and nausea.  Patient reports he ran out of his short-acting insulin two days ago and subsequently developed nausea, polyuria, and headache. States he called EMS earlier today when his symptoms became unbearable. Reports his symptoms are consistent with previous episodes of hyperglycemia. Also endorses increased thirst, shortness of breath, diffuse abdominal pain, and worsening of his bilateral lower extremity neuropathy. Denies fever, chills, cough, chest pain, diarrhea, dysuria, skin changes.   Patient has had numerous previous ED and hospitalizations for hyperglycemia, most recently 6/23.       Past Medical History:  Diagnosis Date  . Anxiety   . Bipolar disorder (HCC)   . Depression   . Diabetes mellitus without complication (HCC)   . Diabetic polyneuropathy associated with type 1 diabetes mellitus (HCC)   . Hydrocephalus (HCC)   . Kidney stones   . Seizures [facility])     Patient Active Problem List   Diagnosis Date Noted  . Hyperkalemia 04/10/2020  . AKI (acute kidney injury) (HCC) 04/10/2020  . Left leg cellulitis 03/23/2020  . Bipolar I disorder, current or most recent episode depressed, in partial remission (HCC)   . MDD (major depressive disorder), recurrent episode, severe (HCC) 03/13/2020  . Drug overdose, intentional (HCC) 03/05/2020  . Drug overdose 03/05/2020  . Drug abuse (HCC) 03/05/2020  . Attempted suicide (HCC) 03/05/2020  . Insulin overdose   . Hypoglycemia 02/03/2020  . Hypothyroidism 02/02/2020  . Diabetic polyneuropathy associated with type 1 diabetes  mellitus (HCC) 02/02/2020  . Type 1 diabetes mellitus with hyperosmolar hyperglycemic state (HHS) (HCC) 12/30/2019  . DKA (diabetic ketoacidosis) (HCC) 12/29/2019  . Homelessness 12/29/2019  . Marijuana abuse 12/29/2019  . Tobacco dependence 12/29/2019  . Seizure disorder (HCC) 12/29/2019  . Suicidal ideation   . Bipolar I disorder, most recent episode depressed, severe without psychotic features (HCC)   . Adjustment disorder with depressed mood 12/07/2017  . Noncompliance 10/17/2017  . Dysthymia 10/08/2017  . Personality disorder in adult [facility]) 09/26/2017  . 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 Obstructive hydrocephalus (HCC) 09/19/2017  . MDD (major depressive disorder) 08/22/2017  . Severe recurrent major depression with psychotic features (HCC) 08/19/2017  . Abdominal pain 08/18/2017  . Hyponatremia 08/18/2017  . DKA (diabetic ketoacidoses) (HCC) 11/24/2015  . Diabetic keto-acidosis (HCC) 10/23/2015    Past Surgical History:  Procedure Laterality Date  . KIDNEY STONE SURGERY         Family History  Problem Relation Age of Onset  . Breast cancer Mother     Social History   Tobacco Use  . Smoking status: Current Every Day Smoker    Packs/day: 1.00    Years: 20.00    Pack years: 20.00    Types: Cigarettes  . Smokeless tobacco: Never Used  Vaping Use  . Vaping Use: Never used  Substance Use Topics  . Alcohol use: Yes    Comment: occassional   . Drug use: Yes    Frequency: 7.0 times per week    Types: Marijuana    Comment: smoked 2-3 days ago, usually smokes daily    Home Medications Prior to Admission medications  Medication Sig Start Date End Date Taking? Authorizing Provider  escitalopram (LEXAPRO) 20 MG tablet Take 1 tablet (20 mg total) by mouth daily. For depression 04/12/20  Yes Osvaldo Shipper, MD  levothyroxine (SYNTHROID) 100 MCG tablet Take 1 tablet (100 mcg total) by mouth daily before breakfast. 04/12/20  Yes Osvaldo Shipper, MD  QUEtiapine (SEROQUEL)  300 MG tablet Take 1 tablet (300 mg total) by mouth at bedtime. For mood control 04/12/20  Yes Osvaldo Shipper, MD  atorvastatin (LIPITOR) 20 MG tablet Take 1 tablet (20 mg total) by mouth daily. 04/12/20   Osvaldo Shipper, MD  divalproex (DEPAKOTE) 250 MG DR tablet Take 3 tablets (750 mg total) by mouth every evening. For mood stabilization 04/12/20   Osvaldo Shipper, MD  divalproex (DEPAKOTE) 500 MG DR tablet Take 1 tablet (500 mg total) by mouth daily. For mood stabilization 04/12/20   Osvaldo Shipper, MD  gabapentin (NEURONTIN) 100 MG capsule Take 100 mg by mouth 2 (two) times daily.    [provider]  insulin aspart protamine- aspart (NOVOLOG MIX 70/30) (70-30) 100 UNIT/ML injection Inject 30 Units into the skin in the morning, at noon, and at bedtime.    [provider]  insulin detemir (LEVEMIR) 100 UNIT/ML FlexPen Inject 25 Units into the skin at bedtime. Patient taking differently: Inject 32 Units into the skin at bedtime.  04/12/20   Osvaldo Shipper, MD  insulin aspart (NOVOLOG) 100 UNIT/ML injection Inject 10 Units into the skin 3 (three) times daily with meals. Patient not taking: Reported on 03/23/2020 03/13/20 04/04/20  Lanae Boast, MD  pregabalin (LYRICA) 150 MG capsule Take 1 capsule (150 mg total) by mouth 2 (two) times daily. Patient not taking: Reported on 03/23/2020 12/31/19 04/04/20  Dimple Nanas, MD    Allergies    Mushroom extract complex, Penicillins, Sulfa antibiotics, and Clindamycin/lincomycin  Review of Systems   Review of Systems  All other systems reviewed and are negative.   Physical Exam Updated Vital Signs BP (!) 140/99   Pulse (!) 108   Resp 11   Ht 6\' 2"  (1.88 m)   Wt 113.4 kg   SpO2 100%   BMI 32.10 kg/m   Physical Exam Constitutional:      Comments: Disheveled-appearing, in no acute distress  HENT:     Head: Normocephalic and atraumatic.     Mouth/Throat:     Mouth: Mucous membranes are dry.     Pharynx: Oropharynx is clear.      Comments: Poor dentition, missing several teeth. Eyes:     Extraocular Movements: Extraocular movements intact.     Conjunctiva/sclera: Conjunctivae normal.     Pupils: Pupils are equal, round, and reactive to light.  Cardiovascular:     Rate and Rhythm: Regular rhythm. Tachycardia present.     Pulses: Normal pulses.     Heart sounds: Normal heart sounds.  Pulmonary:     Breath sounds: Normal breath sounds.  Abdominal:     Palpations: Abdomen is soft.     Tenderness: There is generalized abdominal tenderness.  Neurological:     General: No focal deficit present.     Mental Status: He is alert and oriented to person, place, and time.     Comments: Tenderness with palpation of bilateral lower extremities     ED Results / Procedures / Treatments   Labs (all labs ordered are listed, but only abnormal results are displayed) Labs Reviewed  BASIC METABOLIC PANEL - Abnormal; Notable for the following components:  Result Value   Sodium 125 (*)    Potassium 6.6 (*)    Chloride 93 (*)    CO2 17 (*)    Glucose, Bld 1,092 (*)    BUN 23 (*)    Creatinine, Ser 1.96 (*)    Calcium 8.5 (*)    GFR calc non Af Amer 43 (*)    GFR calc Af Amer 50 (*)    All other components within normal limits  CBC - Abnormal; Notable for the following components:   RBC 3.69 (*)    Hemoglobin 11.2 (*)    HCT 34.4 (*)    All other components within normal limits  URINALYSIS, ROUTINE W REFLEX MICROSCOPIC - Abnormal; Notable for the following components:   Color, Urine STRAW (*)    Glucose, UA >=500 (*)    Ketones, ur 20 (*)    All other components within normal limits  BLOOD GAS, VENOUS - Abnormal; Notable for the following components:   pCO2, Ven 39.6 (*)    Bicarbonate 18.5 (*)    Acid-base deficit 7.1 (*)    All other components within normal limits  BETA-HYDROXYBUTYRIC ACID - Abnormal; Notable for the following components:   Beta-Hydroxybutyric Acid 3.67 (*)    All other components within  normal limits  CBG MONITORING, ED - Abnormal; Notable for the following components:   Glucose-Capillary >600 (*)    All other components within normal limits  CBG MONITORING, ED - Abnormal; Notable for the following components:   Glucose-Capillary >600 (*)    All other components within normal limits  SARS CORONAVIRUS 2 BY RT PCR (HOSPITAL ORDER, PERFORMED IN [facility])  BETA-HYDROXYBUTYRIC ACID  SODIUM, URINE, RANDOM  CREATININE, URINE, RANDOM  BETA-HYDROXYBUTYRIC ACID    EKG EKG Interpretation  Date/Time:  Thursday April 21 2020 23:56:36 EDT Ventricular Rate:  107 PR Interval:    QRS Duration: 87 QT Interval:  320 QTC Calculation: 427 R Axis:   48 Text Interpretation: Sinus tachycardia No significant change since last tracing Confirmed by Jacalyn LefevreHaviland, Julie [phone]([id number]) on 04/21/2020 11:58:44 PM   Radiology No results found.    Medications Ordered in ED Medications  insulin regular, human (MYXREDLIN) 100 units/ 100 mL infusion (9.5 Units/hr Intravenous New Bag/Given 04/21/20 2337)  0.9 %  sodium chloride infusion (has no administration in time range)  dextrose 5 %-0.45 % sodium chloride infusion (has no administration in time range)  dextrose 50 % solution 0-50 mL (has no administration in time range)  sodium zirconium cyclosilicate (LOKELMA) packet 5 g (has no administration in time range)  lactated ringers bolus 2,268 mL (2,268 mLs Intravenous New Bag/Given 04/21/20 2314)    ED Course  I have reviewed the triage vital signs and the nursing notes.  Pertinent labs & imaging results that were available during my care of the patient were reviewed by me and considered in my medical decision making (see chart for details).     BMP significant for glucose of 1,092, potassium of 6.6, creatinine of 1.96. CBC significant for Hgb 11.2. Urinalysis with significant glucose and ketones. VBG with pH of 7.291, bicarb of 18.5. EKG demonstrated sinus tachycardia.   Final Clinical  Impression(s) / ED Diagnoses Final diagnoses:  Diabetic ketoacidosis without coma associated with type 1 diabetes mellitus (HCC)   Mark [name] is a 34 y.o. man with history of T1DM, insulin non-adherence, anxiety, depression, seizures, bipolar disorder, obstructive hydrocephalus who presents to the ED via EMS with hyperglycemia, polyuria, and nausea.  Patient's symptoms and labs concerning for diabetic ketoacidosis secondary to insulin non-adherence. In the ED, patient was treated with IVF and started on an insulin infusion. Will be admitted to the hospital for further management.  Rx / DC Orders ED Discharge Orders    None       Alphonzo Severance, MD 04/22/20 0086    Jacalyn Lefevre, MD 04/22/20 1536

## 2020-04-22 NOTE — Progress Notes (Signed)
Initial Nutrition Assessment  DOCUMENTATION CODES:    (unable to determine malnutrition at this time.)  INTERVENTION:  - will provide oral nutrition supplements if patient is unable to d/c today.   NUTRITION DIAGNOSIS:   Inadequate oral intake related to social / environmental circumstances as evidenced by per patient/family report.  GOAL:   Patient will meet greater than or equal to 90% of their needs  MONITOR:   PO intake, Labs, Weight trends  REASON FOR ASSESSMENT:   Malnutrition Screening Tool  ASSESSMENT:   34 y.o. male with medical history of type 1 DM hypothyroidism, and bipolar disorder. He presented to the ED with hyperglycemia, nausea, and malaise after going without his short acting insulin for several days. Patient was discharged on 6/22 after admission for DKA. He reported he has insulin prescription but does not have any money and was waiting for his roommate to get paid so he can borrow money for the insulin.  Diet advanced from NPO to Carb Modified today at 0845. Patient was eating breakfast at the time of RD visit. Patient has slight abdominal pain but is able to eat without issue or exacerbation. Patient lives with a room mate and states that during the past 1 month (and possibly longer) he has not had consistent access to food. He has recently re-applied for food stamps and states that this should resume in ~1 week. He also goes to a shelter close to where he lives and receives food but states that they do not always have food available for him to take.   Patient confirms running out of insulin and having high blood sugars d/t this and that he was having N/V PTA.   Per chart review, weight today is 225 lb and weight on 6/14 was 249 lb. This indicates 24 lb weight loss (9.6% body weight) in the past 2 weeks; significant for time frame.   Suspect patient meets criteria for some degree of malnutrition but unable to confirm at this time.  Able to talk with RN who  reports that insulin drip is off and that patient is likely to be discharged later today.    Labs reviewed; CBGs: 162-418 mg/dl, K: 3.2 mmol/l, Mg: 1.6 mg/dl. Medications reviewed; sliding scale novolog, 20 units levemir/day, 100 mcg oral synthroid/day, 40 mEq Klor-Con x2 doses 7/2, 5 g lokelma x1 dose 7/1. IVF; D5-1/2 NS @ 75 ml/hr (306 kcal).    NUTRITION - FOCUSED PHYSICAL EXAM:  unable to complete at this time.  Diet Order:   Diet Order            Diet Carb Modified Fluid consistency: Thin; Room service appropriate? Yes  Diet effective now                 EDUCATION NEEDS:   No education needs have been identified at this time  Skin:  Skin Assessment: Reviewed RN Assessment  Last BM:  PTA/unknown  Height:   Ht Readings from Last 1 Encounters:  04/22/20 6\' 2"  (1.88 m)    Weight:   Wt Readings from Last 1 Encounters:  04/22/20 102.2 kg    Ideal Body Weight:     BMI:  Body mass index is 28.93 kg/m.  Estimated Nutritional Needs:   Kcal:  2100-2300 kcal  Protein:  105-115 grams  Fluid:  >/= 2.2 L/day     06/23/20, MS, RD, LDN, CNSC Inpatient Clinical Dietitian RD pager # available in [facility]  After hours/weekend pager # available in [facility]

## 2020-04-22 NOTE — Progress Notes (Signed)
Discharge teaching complete and patient will go home via taxi service due to no ride available till after 8PM.  I emphasized picking up his diabetes meds and keeping up with his management.  Also reinforced follow up a care clinic.

## 2020-04-22 NOTE — TOC Initial Note (Addendum)
Transition of Care [facility]) - Initial/Assessment Note    Patient Details  Name: Mark [name] MRN: [id number] Date of Birth: 04-25-1986  Transition of Care [facility]) CM/SW Contact:    Golda Acre, RN Phone Number: 04/22/2020, 8:19 AM  Clinical Narrative:                 Patient is single and lives alone, no family in area for support reliues on friends.  Iv insulin, Anion Gap is closed. Plan: follow for progression and will need follow up hospital visit with [facility] health and wellness for f/u of diabetes and insulin needs.[facility] card? Appointment for [facility] and health wellness made for [phone] at 1:50pm. Print out of appointment given to patient.patient gave verbal understanding of appointment. Expected Discharge Plan: Home/Self Care Barriers to Discharge: Continued Medical Work up   Patient Goals and CMS Choice Patient states their goals for this hospitalization and ongoing recovery are:: I want to go home CMS Medicare.gov Compare Post Acute Care list provided to:: Patient    Expected Discharge Plan and Services Expected Discharge Plan: Home/Self Care   Discharge Planning Services: CM Consult   Living arrangements for the past 2 months: Single Family Home                                      Prior Living Arrangements/Services Living arrangements for the past 2 months: Single Family Home Lives with:: Self Patient language and need for interpreter reviewed:: Yes Do you feel safe going back to the place where you live?: Yes      Need for Family Participation in Patient Care: Yes (Comment) Care giver support system in place?: No (comment) (does not have outside support system)   Criminal Activity/Legal Involvement Pertinent to Current Situation/Hospitalization: No - Comment as needed  Activities of Daily Living Home Assistive Devices/Equipment: CBG Meter ADL Screening (condition at time of admission) Patient's cognitive ability adequate to safely complete daily  activities?: Yes Is the patient deaf or have difficulty hearing?: No Does the patient have difficulty seeing, even when wearing glasses/contacts?: No Does the patient have difficulty concentrating, remembering, or making decisions?: No Patient able to express need for assistance with ADLs?: Yes Does the patient have difficulty dressing or bathing?: Yes Independently performs ADLs?: Yes (appropriate for developmental age) Does the patient have difficulty walking or climbing stairs?: Yes Weakness of Legs: Both Weakness of Arms/Hands: Both  Permission Sought/Granted                  Emotional Assessment Appearance:: Appears stated age Attitude/Demeanor/Rapport: Engaged Affect (typically observed): Calm Orientation: : Oriented to Self, Oriented to Place, Oriented to  Time, Oriented to Situation Alcohol / Substance Use: Not Applicable Psych Involvement: No (comment)  Admission diagnosis:  DKA (diabetic ketoacidoses) (HCC) [E11.10] Diabetic ketoacidosis without coma associated with type 1 diabetes mellitus (HCC) [E10.10] Patient Active Problem List   Diagnosis Date Noted  . Hyperkalemia 04/10/2020  . AKI (acute kidney injury) (HCC) 04/10/2020  . Left leg cellulitis 03/23/2020  . Bipolar I disorder, current or most recent episode depressed, in partial remission (HCC)   . MDD (major depressive disorder), recurrent episode, severe (HCC) 03/13/2020  . Drug overdose, intentional (HCC) 03/05/2020  . Drug overdose 03/05/2020  . Drug abuse (HCC) 03/05/2020  . Attempted suicide (HCC) 03/05/2020  . Insulin overdose   . Hypoglycemia 02/03/2020  . Hypothyroidism 02/02/2020  . Diabetic polyneuropathy  associated with type 1 diabetes mellitus (HCC) 02/02/2020  . Type 1 diabetes mellitus with hyperosmolar hyperglycemic state (HHS) (HCC) 12/30/2019  . DKA (diabetic ketoacidosis) (HCC) 12/29/2019  . Homelessness 12/29/2019  . Marijuana abuse 12/29/2019  . Tobacco dependence 12/29/2019  .  Seizure disorder (HCC) 12/29/2019  . Suicidal ideation   . Bipolar I disorder, most recent episode depressed, severe without psychotic features (HCC)   . Adjustment disorder with depressed mood 12/07/2017  . Noncompliance 10/17/2017  . Dysthymia 10/08/2017  . Personality disorder in adult [facility]) 09/26/2017  . 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 Obstructive hydrocephalus (HCC) 09/19/2017  . MDD (major depressive disorder) 08/22/2017  . Severe recurrent major depression with psychotic features (HCC) 08/19/2017  . Abdominal pain 08/18/2017  . Hyponatremia 08/18/2017  . DKA (diabetic ketoacidoses) (HCC) 11/24/2015  . Diabetic keto-acidosis (HCC) 10/23/2015   PCP:  Patient, No Pcp Per Pharmacy:   [name] [place], [place] - [address] [place] [address] Phone: [phone] Fax: [phone]  [facility] - [place], [place] - [address] [place] [address] Phone: [phone] Fax: [phone]  [facility] - [place], [place] - [address] [place] [address] [place] [address] Phone: [phone] Fax: [phone]     Social Determinants of Health (SDOH) Interventions    Readmission Risk Interventions Readmission Risk Prevention Plan 03/25/2020  Transportation Screening Complete  Medication Review (RN Care Manager) Complete  PCP or Specialist appointment within 3-5 days of discharge Complete  HRI or Home Care Consult (No Data)  SW Recovery Care/Counseling Consult Complete  Palliative Care Screening Not Applicable  Skilled Nursing Facility Not Applicable  Some recent data might be hidden

## 2020-04-30 ENCOUNTER — Encounter (HOSPITAL_COMMUNITY): Payer: Self-pay

## 2020-04-30 ENCOUNTER — Emergency Department (HOSPITAL_COMMUNITY): Payer: Self-pay

## 2020-04-30 ENCOUNTER — Emergency Department (HOSPITAL_COMMUNITY)
Admission: EM | Admit: 2020-04-30 | Discharge: 2020-04-30 | Disposition: A | Discharge status: Home / Self Care | Payer: Self-pay | Attending: Emergency Medicine | Admitting: Emergency Medicine

## 2020-04-30 DIAGNOSIS — Z79899 Other long term (current) drug therapy: Secondary | ICD-10-CM | POA: Insufficient documentation

## 2020-04-30 DIAGNOSIS — E162 Hypoglycemia, unspecified: Secondary | ICD-10-CM

## 2020-04-30 DIAGNOSIS — F17210 Nicotine dependence, cigarettes, uncomplicated: Secondary | ICD-10-CM | POA: Insufficient documentation

## 2020-04-30 DIAGNOSIS — Z794 Long term (current) use of insulin: Secondary | ICD-10-CM | POA: Insufficient documentation

## 2020-04-30 DIAGNOSIS — E039 Hypothyroidism, unspecified: Secondary | ICD-10-CM | POA: Insufficient documentation

## 2020-04-30 DIAGNOSIS — R569 Unspecified convulsions: Secondary | ICD-10-CM | POA: Insufficient documentation

## 2020-04-30 DIAGNOSIS — E10649 Type 1 diabetes mellitus with hypoglycemia without coma: Secondary | ICD-10-CM | POA: Insufficient documentation

## 2020-04-30 LAB — CBC WITH DIFFERENTIAL/PLATELET
Abs Immature Granulocytes: 0.04 10*3/uL (ref 0.00–0.07)
Basophils Absolute: 0 10*3/uL (ref 0.0–0.1)
Basophils Relative: 0 %
Eosinophils Absolute: 0.1 10*3/uL (ref 0.0–0.5)
Eosinophils Relative: 1 %
HCT: 34.7 % — ABNORMAL LOW (ref 39.0–52.0)
Hemoglobin: 11.4 g/dL — ABNORMAL LOW (ref 13.0–17.0)
Immature Granulocytes: 1 %
Lymphocytes Relative: 14 %
Lymphs Abs: 1 10*3/uL (ref 0.7–4.0)
MCH: 29.3 pg (ref 26.0–34.0)
MCHC: 32.9 g/dL (ref 30.0–36.0)
MCV: 89.2 fL (ref 80.0–100.0)
Monocytes Absolute: 0.6 10*3/uL (ref 0.1–1.0)
Monocytes Relative: 8 %
Neutro Abs: 5.8 10*3/uL (ref 1.7–7.7)
Neutrophils Relative %: 76 %
Platelets: 300 10*3/uL (ref 150–400)
RBC: 3.89 MIL/uL — ABNORMAL LOW (ref 4.22–5.81)
RDW: 13.4 % (ref 11.5–15.5)
WBC: 7.5 10*3/uL (ref 4.0–10.5)
nRBC: 0 % (ref 0.0–0.2)

## 2020-04-30 LAB — BASIC METABOLIC PANEL
Anion gap: 7 (ref 5–15)
BUN: 7 mg/dL (ref 6–20)
CO2: 26 mmol/L (ref 22–32)
Calcium: 8.6 mg/dL — ABNORMAL LOW (ref 8.9–10.3)
Chloride: 103 mmol/L (ref 98–111)
Creatinine, Ser: 0.94 mg/dL (ref 0.61–1.24)
GFR calc Af Amer: >60 mL/min (ref >60)
GFR calc non Af Amer: >60 mL/min (ref >60)
Glucose, Bld: 148 mg/dL — ABNORMAL HIGH (ref 70–99)
Potassium: 3.3 mmol/L — ABNORMAL LOW (ref 3.5–5.1)
Sodium: 136 mmol/L (ref 135–145)

## 2020-04-30 LAB — CBG MONITORING, ED
Glucose-Capillary: 152 mg/dL — ABNORMAL HIGH (ref 70–99)
Glucose-Capillary: 110 mg/dL — ABNORMAL HIGH (ref 70–99)
Glucose-Capillary: 121 mg/dL — ABNORMAL HIGH (ref 70–99)
Glucose-Capillary: 131 mg/dL — ABNORMAL HIGH (ref 70–99)
Glucose-Capillary: 25 mg/dL — CL (ref 70–99)
Glucose-Capillary: 135 mg/dL — ABNORMAL HIGH (ref 70–99)
Glucose-Capillary: 129 mg/dL — ABNORMAL HIGH (ref 70–99)
Glucose-Capillary: 118 mg/dL — ABNORMAL HIGH (ref 70–99)
Glucose-Capillary: 150 mg/dL — ABNORMAL HIGH (ref 70–99)

## 2020-04-30 LAB — VALPROIC ACID LEVEL: Valproic Acid Lvl: <10 ug/mL — ABNORMAL LOW (ref 50.0–100.0)

## 2020-04-30 MED ORDER — DEXTROSE 50 % IV SOLN
1.0000 | Freq: Once | INTRAVENOUS | Status: AC
  Administered 2020-04-30: 50 mL via INTRAVENOUS

## 2020-04-30 NOTE — ED Provider Notes (Signed)
[facility] EMERGENCY DEPARTMENT Provider Note   CSN: [id number] Arrival date & time: 04/30/20  0109     History Chief Complaint  Patient presents with  . Hypoglycemia    Mark [name] is a 34 y.o. male.  The history is provided by the patient and the EMS personnel.  Hypoglycemia Severity:  Severe Onset quality:  Sudden Timing:  Constant Progression:  Improving Chronicity:  New Diabetic status:  Controlled with insulin Context: decreased oral intake   Relieved by:  Glucagon and oral glucose Associated symptoms: altered mental status, seizures and vomiting   Associated symptoms: no shortness of breath   Patient history of bipolar disorder, depression, diabetes presents with hyperglycemia EMS reports they were called to his residence for having a seizure.  On arrival patient was having generalized tonic-clonic seizure.  Initial glucose was 22.  Patient was given midazolam and glucagon.  Patient began to wake up, was given oral glucose.  Patient is now awake and alert.  Repeat CBG was 68.  Patient reports that he took his long-acting insulin but did not eat as he should.  He reports he has had these issues previously. He had otherwise been at his baseline.  No recent chest pain or shortness of breath.  No fevers.  Patient did vomit after EMS arrived.     Past Medical History:  Diagnosis Date  . Anxiety   . Bipolar disorder ([name])   . Depression   . Diabetes mellitus without complication ([name])   . Diabetic polyneuropathy associated with type 1 diabetes mellitus ([name])   . Hydrocephalus ([name])   . Kidney stones   . Seizures [facility])     Patient Active Problem List   Diagnosis Date Noted  . Hyperkalemia 04/10/2020  . AKI (acute kidney injury) ([name]) 04/10/2020  . Left leg cellulitis 03/23/2020  . Bipolar I disorder, current or most recent episode depressed, in partial remission ([name])   . MDD (major depressive disorder), recurrent episode, severe ([name])  03/13/2020  . Drug overdose, intentional ([name]) 03/05/2020  . Drug overdose 03/05/2020  . Drug abuse ([name]) 03/05/2020  . Attempted suicide ([name]) 03/05/2020  . Insulin overdose   . Hypoglycemia 02/03/2020  . Hypothyroidism 02/02/2020  . Diabetic polyneuropathy associated with type 1 diabetes mellitus ([name]) 02/02/2020  . Type 1 diabetes mellitus with hyperosmolar hyperglycemic state (HHS) ([name]) 12/30/2019  . DKA (diabetic ketoacidosis) ([name]) 12/29/2019  . Homelessness 12/29/2019  . Marijuana abuse 12/29/2019  . Tobacco dependence 12/29/2019  . Seizure disorder ([place]) 12/29/2019  . Suicidal ideation   . Bipolar I disorder, most recent episode depressed, severe without psychotic features ([name])   . Adjustment disorder with depressed mood 12/07/2017  . Noncompliance 10/17/2017  . Dysthymia 10/08/2017  . Personality disorder in adult [name]) 09/26/2017  . 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 Obstructive hydrocephalus ([name]) 09/19/2017  . MDD (major depressive disorder) 08/22/2017  . Severe recurrent major depression with psychotic features ([name]) 08/19/2017  . Abdominal pain 08/18/2017  . Hyponatremia 08/18/2017  . DKA (diabetic ketoacidoses) ([name]) 11/24/2015  . Diabetic keto-acidosis ([name]) 10/23/2015    Past Surgical History:  Procedure Laterality Date  . KIDNEY STONE SURGERY         Family History  Problem Relation Age of Onset  . Breast cancer Mother     Social History   Tobacco Use  . Smoking status: Current Every Day Smoker    Packs/day: 1.00    Years: 20.00    Pack years: 20.00  Types: Cigarettes  . Smokeless tobacco: Never Used  Vaping Use  . Vaping Use: Never used  Substance Use Topics  . Alcohol use: Yes    Comment: occassional   . Drug use: Yes    Frequency: 7.0 times per week    Types: Marijuana    Comment: smoked 2-3 days ago, usually smokes daily    Home Medications Prior to Admission medications   Medication Sig Start Date End Date Taking? Authorizing  Provider  atorvastatin (LIPITOR) 20 MG tablet Take 1 tablet (20 mg total) by mouth daily. 04/12/20   Bonnielee Haff, MD  blood glucose meter kit and supplies KIT Dispense based on patient and insurance preference. Use up to four times daily as directed. (FOR ICD-9 250.00, 250.01). 04/22/20   Raiford Noble Latif, DO  divalproex (DEPAKOTE) 250 MG DR tablet Take 3 tablets (750 mg total) by mouth every evening. For mood stabilization 04/12/20   Bonnielee Haff, MD  divalproex (DEPAKOTE) 500 MG DR tablet Take 1 tablet (500 mg total) by mouth daily. For mood stabilization 04/12/20   Bonnielee Haff, MD  escitalopram (LEXAPRO) 20 MG tablet Take 1 tablet (20 mg total) by mouth daily. For depression 04/12/20   Bonnielee Haff, MD  gabapentin (NEURONTIN) 100 MG capsule Take 100 mg by mouth 2 (two) times daily.    [provider]  insulin detemir (LEVEMIR) 100 UNIT/ML FlexPen Inject 30 Units into the skin at bedtime. 04/22/20   Sheikh, Omair Latif, DO  Insulin Pen Needle (PEN NEEDLES) 31G X 5 MM MISC 1 Container by Does not apply route 4 (four) times daily -  with meals and at bedtime. 04/22/20   Raiford Noble Latif, DO  Insulin Regular Human (NOVOLIN R FLEXPEN) 100 UNIT/ML SOPN Inject 10 Units as directed with breakfast, with lunch, and with evening meal. 04/22/20 05/22/20  Raiford Noble Latif, DO  levothyroxine (SYNTHROID) 100 MCG tablet Take 1 tablet (100 mcg total) by mouth daily before breakfast. 04/12/20   Bonnielee Haff, MD  QUEtiapine (SEROQUEL) 300 MG tablet Take 1 tablet (300 mg total) by mouth at bedtime. For mood control 04/12/20   Bonnielee Haff, MD  insulin aspart (NOVOLOG) 100 UNIT/ML injection Inject 10 Units into the skin 3 (three) times daily with meals. Patient not taking: Reported on 03/23/2020 03/13/20 04/04/20  Antonieta Pert, MD  pregabalin (LYRICA) 150 MG capsule Take 1 capsule (150 mg total) by mouth 2 (two) times daily. Patient not taking: Reported on 03/23/2020 12/31/19 04/04/20  Damita Lack,  MD    Allergies    Mushroom extract complex, Penicillins, Sulfa antibiotics, and Clindamycin/lincomycin  Review of Systems   Review of Systems  Constitutional: Negative for fever.  Respiratory: Negative for shortness of breath.   Cardiovascular: Negative for chest pain.  Gastrointestinal: Positive for vomiting.  Neurological: Positive for seizures.  All other systems reviewed and are negative.   Physical Exam Updated Vital Signs BP 119/73 (BP Location: Right Arm)   Pulse (!) 102   Temp 98.6 F (37 C) (Oral)   Resp (!) 22   Ht 1.88 m (6' 2" )   Wt 113.4 kg   SpO2 96%   BMI 32.10 kg/m   Physical Exam CONSTITUTIONAL: Disheveled, no acute distress, patient is covered in vomit HEAD: Normocephalic/atraumatic EYES: EOMI/PERRL ENMT: Mucous membranes moist, poor dentition NECK: supple no meningeal signs SPINE/BACK:entire spine nontender CV: S1/S2 noted, no murmurs/rubs/gallops noted LUNGS: Lungs are clear to auscultation bilaterally, no apparent distress ABDOMEN: soft, nontender, no rebound or guarding, bowel  sounds noted throughout abdomen GU:no cva tenderness NEURO: Pt is awake/alert/appropriate, moves all extremitiesx4.  No facial droop.  No focal weakness EXTREMITIES: pulses normal/equal, full ROM SKIN: warm, color normal PSYCH: no abnormalities of mood noted, alert and oriented to situation  ED Results / Procedures / Treatments   Labs (all labs ordered are listed, but only abnormal results are displayed) Labs Reviewed  BASIC METABOLIC PANEL - Abnormal; Notable for the following components:      Result Value   Potassium 3.3 (*)    Glucose, Bld 148 (*)    Calcium 8.6 (*)    All other components within normal limits  CBC WITH DIFFERENTIAL/PLATELET - Abnormal; Notable for the following components:   RBC 3.89 (*)    Hemoglobin 11.4 (*)    HCT 34.7 (*)    All other components within normal limits  VALPROIC ACID LEVEL - Abnormal; Notable for the following components:     Valproic Acid Lvl <10 (*)    All other components within normal limits  CBG MONITORING, ED - Abnormal; Notable for the following components:   Glucose-Capillary 152 (*)    All other components within normal limits  CBG MONITORING, ED - Abnormal; Notable for the following components:   Glucose-Capillary 110 (*)    All other components within normal limits  CBG MONITORING, ED - Abnormal; Notable for the following components:   Glucose-Capillary 43 (*)    All other components within normal limits  CBG MONITORING, ED - Abnormal; Notable for the following components:   Glucose-Capillary 25 (*)    All other components within normal limits  CBG MONITORING, ED - Abnormal; Notable for the following components:   Glucose-Capillary 121 (*)    All other components within normal limits  CBG MONITORING, ED - Abnormal; Notable for the following components:   Glucose-Capillary 131 (*)    All other components within normal limits  CBG MONITORING, ED - Abnormal; Notable for the following components:   Glucose-Capillary 135 (*)    All other components within normal limits  CBG MONITORING, ED - Abnormal; Notable for the following components:   Glucose-Capillary 129 (*)    All other components within normal limits  CBG MONITORING, ED - Abnormal; Notable for the following components:   Glucose-Capillary 118 (*)    All other components within normal limits    EKG EKG Interpretation  Date/Time:  Saturday April 30 2020 01:24:10 EDT Ventricular Rate:  100 PR Interval:    QRS Duration: 84 QT Interval:  340 QTC Calculation: 439 R Axis:   33 Text Interpretation: Sinus tachycardia Atrial premature complex Probable left atrial enlargement RSR' in V1 or V2, probably normal variant No significant change since last tracing Confirmed by Ripley Fraise [phone]) on 04/30/2020 1:26:23 AM   Radiology DG Chest Port 1 View  Result Date: 04/30/2020 CLINICAL DATA:  Seizure. EXAM: PORTABLE CHEST 1 VIEW  COMPARISON:  March 28, 2020 FINDINGS: Mildly increased bibasilar lung markings are seen without evidence of acute infiltrate, pleural effusion or pneumothorax. The heart size and mediastinal contours are within normal limits. The visualized skeletal structures are unremarkable. IMPRESSION: Mildly increased bibasilar lung markings without evidence of acute or active cardiopulmonary disease. Electronically Signed   By: Virgina Norfolk M.D.   On: 04/30/2020 01:45    Procedures Procedures  Medications Ordered in ED Medications  dextrose 50 % solution 50 mL (50 mLs Intravenous Given 04/30/20 0353)    ED Course  I have reviewed the triage vital signs and  the nursing notes.  Pertinent labs & imaging results that were available during my care of the patient were reviewed by me and considered in my medical decision making (see chart for details).    MDM Rules/Calculators/A&P                          Patient presents after having a seizure due to hypoglycemia, though on review of records he does have history of seizures. He is awake alert this time. Patient's had multiple ER visits, typically for hyperglycemia and at times for hypoglycemia 4:17 AM Patient had episode of hypoglycemia despite drinking juice and eating crackers Given an amp of D50 with improvement.  We will continue to monitor 7:13 AM Patient monitored for several hours.  His glucose is improved.  He has been checked multiple times and has been over 100 Discussed the need to scale back his insulin dosing.  Patient reports he typically will reduce his dosing. Will feed patient, and if he tolerates this he will be discharged Final Clinical Impression(s) / ED Diagnoses Final diagnoses:  Hypoglycemia    Rx / DC Orders ED Discharge Orders    None       Ripley Fraise, MD 04/30/20 [phone]

## 2020-04-30 NOTE — ED Notes (Signed)
Pt report he has to take a bowel movement, pt offered bedpan or bedside commode, due to pt current status with CBG and c/o of tiredness. Pt refuses both , and proceeds to tear off all medical equipment and walks to the bathroom. RN tried to redirect pt which was unsuccessful, pt moves past RN and walks to the bathroom

## 2020-04-30 NOTE — ED Notes (Addendum)
RN has spoke to MD [facility] about pt CBG, wants pt FS to be checked again at 5. MD also made aware pt state he cannot tolerate any more food  RN will continue to monitor

## 2020-04-30 NOTE — ED Notes (Addendum)
PA Mia made aware of Pt CBG of 43 , states to give pt oral orange juice and food and recheck CBG 15 minutes after consumption

## 2020-04-30 NOTE — ED Notes (Signed)
MD Wickline notified of pt CNG of 25 , verbal order placed for dextrose 5 , 1 ample

## 2020-04-30 NOTE — Discharge Instructions (Signed)
Hold all of your insulin products today.  Be sure to check your sugar frequently.  And when you start using your insulin again be sure to eat full meals

## 2020-04-30 NOTE — ED Triage Notes (Signed)
Pt BIB EMS home c.o diabetic seizure and hypoglycemia , CBG 22 for EMS, pt noted to be posturing and covered in vomit per EMS.   EMS GAVE 5mg  midazolam , 1mg  glucagon , oral glucose 1 packet , cbg after 68   BP  148/86 12 LEAD UNREMARKABLE  98 RA Rhonchi  upper lobes per EMS

## 2020-05-02 LAB — CBG MONITORING, ED: Glucose-Capillary: 43 mg/dL — CL (ref 70–99)

## 2020-06-07 ENCOUNTER — Ambulatory Visit: Payer: Self-pay | Admitting: Nurse Practitioner

## 2020-06-19 ENCOUNTER — Emergency Department (HOSPITAL_COMMUNITY)
Admission: EM | Admit: 2020-06-19 | Discharge: 2020-06-20 | Disposition: A | Discharge status: Home / Self Care | Payer: Self-pay | Attending: Emergency Medicine | Admitting: Emergency Medicine

## 2020-06-19 ENCOUNTER — Other Ambulatory Visit: Payer: Self-pay

## 2020-06-19 ENCOUNTER — Encounter (HOSPITAL_COMMUNITY): Payer: Self-pay

## 2020-06-19 DIAGNOSIS — Z794 Long term (current) use of insulin: Secondary | ICD-10-CM | POA: Insufficient documentation

## 2020-06-19 DIAGNOSIS — Z79899 Other long term (current) drug therapy: Secondary | ICD-10-CM | POA: Insufficient documentation

## 2020-06-19 DIAGNOSIS — Z9119 Patient's noncompliance with other medical treatment and regimen: Secondary | ICD-10-CM | POA: Insufficient documentation

## 2020-06-19 DIAGNOSIS — E1042 Type 1 diabetes mellitus with diabetic polyneuropathy: Secondary | ICD-10-CM | POA: Insufficient documentation

## 2020-06-19 DIAGNOSIS — E039 Hypothyroidism, unspecified: Secondary | ICD-10-CM | POA: Insufficient documentation

## 2020-06-19 DIAGNOSIS — R739 Hyperglycemia, unspecified: Secondary | ICD-10-CM

## 2020-06-19 DIAGNOSIS — E1065 Type 1 diabetes mellitus with hyperglycemia: Secondary | ICD-10-CM | POA: Insufficient documentation

## 2020-06-19 DIAGNOSIS — F17210 Nicotine dependence, cigarettes, uncomplicated: Secondary | ICD-10-CM | POA: Insufficient documentation

## 2020-06-19 LAB — URINALYSIS, ROUTINE W REFLEX MICROSCOPIC
Bacteria, UA: NONE SEEN
Bilirubin Urine: NEGATIVE
Glucose, UA: >=500 mg/dL — AB
Hgb urine dipstick: NEGATIVE
Ketones, ur: NEGATIVE mg/dL
Leukocytes,Ua: NEGATIVE
Nitrite: NEGATIVE
Protein, ur: NEGATIVE mg/dL
Specific Gravity, Urine: 1.021 (ref 1.005–1.030)
pH: 7 (ref 5.0–8.0)

## 2020-06-19 LAB — BASIC METABOLIC PANEL
Anion gap: 14 (ref 5–15)
BUN: 17 mg/dL (ref 6–20)
CO2: 19 mmol/L — ABNORMAL LOW (ref 22–32)
Calcium: 8.8 mg/dL — ABNORMAL LOW (ref 8.9–10.3)
Chloride: 92 mmol/L — ABNORMAL LOW (ref 98–111)
Creatinine, Ser: 1.33 mg/dL — ABNORMAL HIGH (ref 0.61–1.24)
GFR calc Af Amer: >60 mL/min (ref >60)
GFR calc non Af Amer: >60 mL/min (ref >60)
Glucose, Bld: 1141 mg/dL (ref 70–99)
Potassium: 5.9 mmol/L — ABNORMAL HIGH (ref 3.5–5.1)
Sodium: 125 mmol/L — ABNORMAL LOW (ref 135–145)

## 2020-06-19 LAB — CBC
HCT: 39.9 % (ref 39.0–52.0)
Hemoglobin: 12.6 g/dL — ABNORMAL LOW (ref 13.0–17.0)
MCH: 29.6 pg (ref 26.0–34.0)
MCHC: 31.6 g/dL (ref 30.0–36.0)
MCV: 93.7 fL (ref 80.0–100.0)
Platelets: 294 10*3/uL (ref 150–400)
RBC: 4.26 MIL/uL (ref 4.22–5.81)
RDW: 13.8 % (ref 11.5–15.5)
WBC: 10.8 10*3/uL — ABNORMAL HIGH (ref 4.0–10.5)
nRBC: 0 % (ref 0.0–0.2)

## 2020-06-19 LAB — CBG MONITORING, ED
Glucose-Capillary: >600 mg/dL (ref 70–99)
Glucose-Capillary: >600 mg/dL (ref 70–99)

## 2020-06-19 MED ORDER — INSULIN REGULAR(HUMAN) IN NACL 100-0.9 UT/100ML-% IV SOLN
INTRAVENOUS | Status: DC
  Administered 2020-06-19: 13 [IU]/h via INTRAVENOUS
  Filled 2020-06-19: qty 100

## 2020-06-19 MED ORDER — DEXTROSE IN LACTATED RINGERS 5 % IV SOLN: INTRAVENOUS | Status: DC

## 2020-06-19 MED ORDER — DEXTROSE 50 % IV SOLN: 0.0000 mL | INTRAVENOUS | Status: DC | PRN

## 2020-06-19 MED ORDER — SODIUM CHLORIDE 0.9 % IV BOLUS
1000.0000 mL | Freq: Once | INTRAVENOUS | Status: AC
  Administered 2020-06-19: 1000 mL via INTRAVENOUS

## 2020-06-19 MED ORDER — LACTATED RINGERS IV SOLN: INTRAVENOUS | Status: DC

## 2020-06-19 NOTE — ED Triage Notes (Signed)
Per ems: pt coming in c/o hyperglycemia x2 days. N/V. States he is out of his insulin. cbg- high   4mg  zofran  1L NS 20 L AC

## 2020-06-19 NOTE — ED Notes (Signed)
Date and time results received: 06/19/20 10:07 PM    Test: 1141 Critical Value: glucose  Name of Provider Notified: messick, md

## 2020-06-19 NOTE — ED Provider Notes (Signed)
[facility] DEPT Provider Note   CSN: [id number] Arrival date & time: 06/19/20  1957     History Chief Complaint  Patient presents with  . Hyperglycemia    Mark [name] is a 34 y.o. male.  Patient to ED with high blood sugar. No nausea, vomiting. He has history of T1DM, largely noncompliant, multiple ED visits for both hypo- and hyperglycemia, DKA admissions. He denies pain, fever, SoB, cough. He reports his insulin dosing is 35 U 70/30 insulin TID but he is out currently. Last dose he had was last evening (06/18/20).   The history is provided by the patient. No language interpreter was used.  Hyperglycemia Associated symptoms: increased thirst and polyuria   Associated symptoms: no fever        Past Medical History:  Diagnosis Date  . Anxiety   . Bipolar disorder ([place])   . Depression   . Diabetes mellitus without complication ([place])   . Diabetic polyneuropathy associated with type 1 diabetes mellitus ([place])   . Hydrocephalus ([place])   . Kidney stones   . Seizures [facility])     Patient Active Problem List   Diagnosis Date Noted  . Hyperkalemia 04/10/2020  . AKI (acute kidney injury) ([place]) 04/10/2020  . Left leg cellulitis 03/23/2020  . Bipolar I disorder, current or most recent episode depressed, in partial remission ([place])   . MDD (major depressive disorder), recurrent episode, severe ([place]) 03/13/2020  . Drug overdose, intentional ([place]) 03/05/2020  . Drug overdose 03/05/2020  . Drug abuse ([place]) 03/05/2020  . Attempted suicide ([place]) 03/05/2020  . Insulin overdose   . Hypoglycemia 02/03/2020  . Hypothyroidism 02/02/2020  . Diabetic polyneuropathy associated with type 1 diabetes mellitus ([place]) 02/02/2020  . Type 1 diabetes mellitus with hyperosmolar hyperglycemic state (HHS) ([place]) 12/30/2019  . DKA (diabetic ketoacidosis) ([place]) 12/29/2019  . Homelessness 12/29/2019  . Marijuana abuse 12/29/2019  . Tobacco dependence 12/29/2019  . Seizure  disorder ([place]) 12/29/2019  . Suicidal ideation   . Bipolar I disorder, most recent episode depressed, severe without psychotic features ([place])   . Adjustment disorder with depressed mood 12/07/2017  . Noncompliance 10/17/2017  . Dysthymia 10/08/2017  . Personality disorder in adult [facility]) 09/26/2017  . 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 Obstructive hydrocephalus ([place]) 09/19/2017  . MDD (major depressive disorder) 08/22/2017  . Severe recurrent major depression with psychotic features ([place]) 08/19/2017  . Abdominal pain 08/18/2017  . Hyponatremia 08/18/2017  . DKA (diabetic ketoacidoses) ([place]) 11/24/2015  . Diabetic keto-acidosis ([place]) 10/23/2015    Past Surgical History:  Procedure Laterality Date  . KIDNEY STONE SURGERY         Family History  Problem Relation Age of Onset  . Breast cancer Mother     Social History   Tobacco Use  . Smoking status: Current Every Day Smoker    Packs/day: 1.00    Years: 20.00    Pack years: 20.00    Types: Cigarettes  . Smokeless tobacco: Never Used  Vaping Use  . Vaping Use: Never used  Substance Use Topics  . Alcohol use: Yes    Comment: occassional   . Drug use: Yes    Frequency: 7.0 times per week    Types: Marijuana    Comment: smoked 2-3 days ago, usually smokes daily    Home Medications Prior to Admission medications   Medication Sig Start Date End Date Taking? Authorizing Provider  atorvastatin (LIPITOR) 20 MG tablet Take 1 tablet (20 mg total) by  mouth daily. 04/12/20   Bonnielee Haff, MD  blood glucose meter kit and supplies KIT Dispense based on patient and insurance preference. Use up to four times daily as directed. (FOR ICD-9 250.00, 250.01). 04/22/20   Raiford Noble Latif, DO  divalproex (DEPAKOTE) 250 MG DR tablet Take 3 tablets (750 mg total) by mouth every evening. For mood stabilization 04/12/20   Bonnielee Haff, MD  divalproex (DEPAKOTE) 500 MG DR tablet Take 1 tablet (500 mg total) by mouth daily. For mood stabilization  04/12/20   Bonnielee Haff, MD  escitalopram (LEXAPRO) 20 MG tablet Take 1 tablet (20 mg total) by mouth daily. For depression 04/12/20   Bonnielee Haff, MD  gabapentin (NEURONTIN) 100 MG capsule Take 100 mg by mouth 2 (two) times daily.    [provider]  insulin detemir (LEVEMIR) 100 UNIT/ML FlexPen Inject 30 Units into the skin at bedtime. 04/22/20   Sheikh, Omair Latif, DO  Insulin Pen Needle (PEN NEEDLES) 31G X 5 MM MISC 1 Container by Does not apply route 4 (four) times daily -  with meals and at bedtime. 04/22/20   Raiford Noble Latif, DO  Insulin Regular Human (NOVOLIN R FLEXPEN) 100 UNIT/ML SOPN Inject 10 Units as directed with breakfast, with lunch, and with evening meal. 04/22/20 05/22/20  Raiford Noble Latif, DO  levothyroxine (SYNTHROID) 100 MCG tablet Take 1 tablet (100 mcg total) by mouth daily before breakfast. 04/12/20   Bonnielee Haff, MD  QUEtiapine (SEROQUEL) 300 MG tablet Take 1 tablet (300 mg total) by mouth at bedtime. For mood control 04/12/20   Bonnielee Haff, MD  insulin aspart (NOVOLOG) 100 UNIT/ML injection Inject 10 Units into the skin 3 (three) times daily with meals. Patient not taking: Reported on 03/23/2020 03/13/20 04/04/20  Antonieta Pert, MD  pregabalin (LYRICA) 150 MG capsule Take 1 capsule (150 mg total) by mouth 2 (two) times daily. Patient not taking: Reported on 03/23/2020 12/31/19 04/04/20  Damita Lack, MD    Allergies    Mushroom extract complex, Penicillins, Sulfa antibiotics, and Clindamycin/lincomycin  Review of Systems   Review of Systems  Constitutional: Negative for chills and fever.  HENT: Negative.   Respiratory: Negative.   Cardiovascular: Negative.   Gastrointestinal: Negative.   Endocrine: Positive for polydipsia and polyuria.  Musculoskeletal: Negative.   Skin: Negative.   Neurological: Negative.     Physical Exam Updated Vital Signs BP (!) 148/80   Pulse 100   Temp 98.3 F (36.8 C)   Resp 19   SpO2 97%   Physical Exam Vitals  and nursing note reviewed.  Constitutional:      Appearance: He is well-developed.     Comments: Unkempt, but nontoxic  HENT:     Head: Normocephalic.  Cardiovascular:     Rate and Rhythm: Normal rate and regular rhythm.     Heart sounds: No murmur heard.   Pulmonary:     Effort: Pulmonary effort is normal.     Breath sounds: Normal breath sounds. No wheezing, rhonchi or rales.  Abdominal:     General: Bowel sounds are normal.     Palpations: Abdomen is soft.     Tenderness: There is no abdominal tenderness. There is no guarding or rebound.  Musculoskeletal:        General: Normal range of motion.     Cervical back: Normal range of motion and neck supple.  Skin:    General: Skin is warm and dry.     Findings: No rash.  Neurological:  Mental Status: He is alert and oriented to person, place, and time.     ED Results / Procedures / Treatments   Labs (all labs ordered are listed, but only abnormal results are displayed) Labs Reviewed  BASIC METABOLIC PANEL - Abnormal; Notable for the following components:      Result Value   Sodium 125 (*)    Potassium 5.9 (*)    Chloride 92 (*)    CO2 19 (*)    Glucose, Bld 1,141 (*)    Creatinine, Ser 1.33 (*)    Calcium 8.8 (*)    All other components within normal limits  CBC - Abnormal; Notable for the following components:   WBC 10.8 (*)    Hemoglobin 12.6 (*)    All other components within normal limits  URINALYSIS, ROUTINE W REFLEX MICROSCOPIC - Abnormal; Notable for the following components:   Color, Urine STRAW (*)    Glucose, UA >=500 (*)    All other components within normal limits  CBG MONITORING, ED - Abnormal; Notable for the following components:   Glucose-Capillary >600 (*)    All other components within normal limits    EKG None  Radiology No results found.  Procedures Procedures (including critical care time) CRITICAL CARE Performed by: Dewaine Oats   Total critical care time: 35  minutes  Critical care time was exclusive of separately billable procedures and treating other patients.  Critical care was necessary to treat or prevent imminent or life-threatening deterioration.  Critical care was time spent personally by me on the following activities: development of treatment plan with patient and/or surrogate as well as nursing, discussions with consultants, evaluation of patient's response to treatment, examination of patient, obtaining history from patient or surrogate, ordering and performing treatments and interventions, ordering and review of laboratory studies, ordering and review of radiographic studies, pulse oximetry and re-evaluation of patient's condition.  Medications Ordered in ED Medications  sodium chloride 0.9 % bolus 1,000 mL (has no administration in time range)  insulin regular, human (MYXREDLIN) 100 units/ 100 mL infusion (has no administration in time range)  lactated ringers infusion (has no administration in time range)  dextrose 5 % in lactated ringers infusion (has no administration in time range)  dextrose 50 % solution 0-50 mL (has no administration in time range)    ED Course  I have reviewed the triage vital signs and the nursing notes.  Pertinent labs & imaging results that were available during my care of the patient were reviewed by me and considered in my medical decision making (see chart for details).    MDM Rules/Calculators/A&P                          Patient to ED with high blood sugar. He reports he has no insulin at home.   VSS. Initial labs do not show any evidence DKA. He is not vomiting in the ED and is drinking fluids without difficulty. Initial CBG 1141. Insulin drip started. Chart reviewed.   Blood sugar decreases over time as expected. No change in mentation. He remains comfortable. VSS.   Last CBG 125. He reports he is ready to be discharged. Discussed a social work consult would be placed and they will contact  him to discuss options for obtaining his medication.   He is considered stable for discharge home.    Final Clinical Impression(s) / ED Diagnoses Final diagnoses:  None   1. Hyperglycemia 2.  Medication noncompliance  Rx / DC Orders ED Discharge Orders    None       Charlann Lange, PA-C 06/20/20 0507    Valarie Merino, MD 06/21/20 1113

## 2020-06-20 LAB — CBG MONITORING, ED
Glucose-Capillary: 125 mg/dL — ABNORMAL HIGH (ref 70–99)
Glucose-Capillary: 409 mg/dL — ABNORMAL HIGH (ref 70–99)
Glucose-Capillary: 556 mg/dL (ref 70–99)
Glucose-Capillary: >600 mg/dL (ref 70–99)
Glucose-Capillary: >600 mg/dL (ref 70–99)
Glucose-Capillary: >600 mg/dL (ref 70–99)
Glucose-Capillary: >600 mg/dL (ref 70–99)

## 2020-06-20 LAB — BLOOD GAS, VENOUS
Acid-base deficit: 4 mmol/L — ABNORMAL HIGH (ref 0.0–2.0)
Bicarbonate: 20.7 mmol/L (ref 20.0–28.0)
O2 Saturation: 86.5 %
Patient temperature: 98.6
pCO2, Ven: 38.9 mmHg — ABNORMAL LOW (ref 44.0–60.0)
pH, Ven: 7.346 (ref 7.250–7.430)
pO2, Ven: 57.9 mmHg — ABNORMAL HIGH (ref 32.0–45.0)

## 2020-06-20 MED ORDER — INSULIN ASPART PROT & ASPART (70-30 MIX) 100 UNIT/ML ~~LOC~~ SUSP: 35.0000 [IU] | Freq: Once | SUBCUTANEOUS | Status: DC

## 2020-06-20 NOTE — Discharge Instructions (Addendum)
You should be contacted by our transition of care team (social work) to provide resources for obtaining your insulin.

## 2020-06-21 ENCOUNTER — Inpatient Hospital Stay (HOSPITAL_COMMUNITY)
Admission: EM | Admit: 2020-06-21 | Discharge: 2020-06-26 | DRG: 871 | Disposition: A | Discharge status: Home / Self Care | Payer: Medicaid Other | Attending: Internal Medicine | Admitting: Internal Medicine

## 2020-06-21 ENCOUNTER — Encounter (HOSPITAL_COMMUNITY): Payer: Self-pay | Admitting: Emergency Medicine

## 2020-06-21 DIAGNOSIS — D72829 Elevated white blood cell count, unspecified: Secondary | ICD-10-CM | POA: Diagnosis present

## 2020-06-21 DIAGNOSIS — E1010 Type 1 diabetes mellitus with ketoacidosis without coma: Secondary | ICD-10-CM | POA: Diagnosis present

## 2020-06-21 DIAGNOSIS — Z5900 Homelessness unspecified: Secondary | ICD-10-CM

## 2020-06-21 DIAGNOSIS — N179 Acute kidney failure, unspecified: Secondary | ICD-10-CM | POA: Diagnosis present

## 2020-06-21 DIAGNOSIS — W050XXA Fall from non-moving wheelchair, initial encounter: Secondary | ICD-10-CM | POA: Diagnosis present

## 2020-06-21 DIAGNOSIS — F419 Anxiety disorder, unspecified: Secondary | ICD-10-CM | POA: Diagnosis present

## 2020-06-21 DIAGNOSIS — Z794 Long term (current) use of insulin: Secondary | ICD-10-CM

## 2020-06-21 DIAGNOSIS — F3130 Bipolar disorder, current episode depressed, mild or moderate severity, unspecified: Secondary | ICD-10-CM | POA: Diagnosis present

## 2020-06-21 DIAGNOSIS — E875 Hyperkalemia: Secondary | ICD-10-CM | POA: Diagnosis present

## 2020-06-21 DIAGNOSIS — Z880 Allergy status to penicillin: Secondary | ICD-10-CM

## 2020-06-21 DIAGNOSIS — Z9119 Patient's noncompliance with other medical treatment and regimen: Secondary | ICD-10-CM

## 2020-06-21 DIAGNOSIS — G9340 Encephalopathy, unspecified: Secondary | ICD-10-CM

## 2020-06-21 DIAGNOSIS — E1110 Type 2 diabetes mellitus with ketoacidosis without coma: Secondary | ICD-10-CM | POA: Diagnosis present

## 2020-06-21 DIAGNOSIS — E10649 Type 1 diabetes mellitus with hypoglycemia without coma: Secondary | ICD-10-CM | POA: Diagnosis present

## 2020-06-21 DIAGNOSIS — G40909 Epilepsy, unspecified, not intractable, without status epilepticus: Secondary | ICD-10-CM | POA: Diagnosis present

## 2020-06-21 DIAGNOSIS — Z6832 Body mass index (BMI) 32.0-32.9, adult: Secondary | ICD-10-CM

## 2020-06-21 DIAGNOSIS — E1065 Type 1 diabetes mellitus with hyperglycemia: Secondary | ICD-10-CM | POA: Diagnosis present

## 2020-06-21 DIAGNOSIS — F314 Bipolar disorder, current episode depressed, severe, without psychotic features: Secondary | ICD-10-CM | POA: Diagnosis present

## 2020-06-21 DIAGNOSIS — Z91018 Allergy to other foods: Secondary | ICD-10-CM

## 2020-06-21 DIAGNOSIS — E1042 Type 1 diabetes mellitus with diabetic polyneuropathy: Secondary | ICD-10-CM | POA: Diagnosis present

## 2020-06-21 DIAGNOSIS — Z79899 Other long term (current) drug therapy: Secondary | ICD-10-CM

## 2020-06-21 DIAGNOSIS — Z888 Allergy status to other drugs, medicaments and biological substances status: Secondary | ICD-10-CM

## 2020-06-21 DIAGNOSIS — R6510 Systemic inflammatory response syndrome (SIRS) of non-infectious origin without acute organ dysfunction: Secondary | ICD-10-CM

## 2020-06-21 DIAGNOSIS — Z882 Allergy status to sulfonamides status: Secondary | ICD-10-CM

## 2020-06-21 DIAGNOSIS — E860 Dehydration: Secondary | ICD-10-CM | POA: Diagnosis present

## 2020-06-21 DIAGNOSIS — I619 Nontraumatic intracerebral hemorrhage, unspecified: Secondary | ICD-10-CM

## 2020-06-21 DIAGNOSIS — E1069 Type 1 diabetes mellitus with other specified complication: Secondary | ICD-10-CM | POA: Diagnosis present

## 2020-06-21 DIAGNOSIS — Z87442 Personal history of urinary calculi: Secondary | ICD-10-CM

## 2020-06-21 DIAGNOSIS — A419 Sepsis, unspecified organism: Principal | ICD-10-CM | POA: Diagnosis present

## 2020-06-21 DIAGNOSIS — E039 Hypothyroidism, unspecified: Secondary | ICD-10-CM | POA: Diagnosis present

## 2020-06-21 DIAGNOSIS — E870 Hyperosmolality and hypernatremia: Secondary | ICD-10-CM | POA: Diagnosis present

## 2020-06-21 DIAGNOSIS — F17210 Nicotine dependence, cigarettes, uncomplicated: Secondary | ICD-10-CM | POA: Diagnosis present

## 2020-06-21 DIAGNOSIS — Z20822 Contact with and (suspected) exposure to covid-19: Secondary | ICD-10-CM | POA: Diagnosis present

## 2020-06-21 DIAGNOSIS — E669 Obesity, unspecified: Secondary | ICD-10-CM | POA: Diagnosis present

## 2020-06-21 LAB — CBC
HCT: 45.3 % (ref 39.0–52.0)
Hemoglobin: 13.4 g/dL (ref 13.0–17.0)
MCH: 29.3 pg (ref 26.0–34.0)
MCHC: 29.6 g/dL — ABNORMAL LOW (ref 30.0–36.0)
MCV: 98.9 fL (ref 80.0–100.0)
Platelets: 473 10*3/uL — ABNORMAL HIGH (ref 150–400)
RBC: 4.58 MIL/uL (ref 4.22–5.81)
RDW: 13.4 % (ref 11.5–15.5)
WBC: 30.6 10*3/uL — ABNORMAL HIGH (ref 4.0–10.5)
nRBC: 0 % (ref 0.0–0.2)

## 2020-06-21 LAB — I-STAT VENOUS BLOOD GAS, ED
Acid-base deficit: 21 mmol/L — ABNORMAL HIGH (ref 0.0–2.0)
Bicarbonate: 7.7 mmol/L — ABNORMAL LOW (ref 20.0–28.0)
Calcium, Ion: 0.95 mmol/L — ABNORMAL LOW (ref 1.15–1.40)
HCT: 46 % (ref 39.0–52.0)
Hemoglobin: 15.6 g/dL (ref 13.0–17.0)
O2 Saturation: 78 %
Potassium: 8 mmol/L (ref 3.5–5.1)
Sodium: 118 mmol/L — CL (ref 135–145)
TCO2: 9 mmol/L — ABNORMAL LOW (ref 22–32)
pCO2, Ven: 26.8 mmHg — ABNORMAL LOW (ref 44.0–60.0)
pH, Ven: 7.068 — CL (ref 7.250–7.430)
pO2, Ven: 58 mmHg — ABNORMAL HIGH (ref 32.0–45.0)

## 2020-06-21 LAB — CBG MONITORING, ED
Glucose-Capillary: >600 mg/dL (ref 70–99)
Glucose-Capillary: >600 mg/dL (ref 70–99)

## 2020-06-21 LAB — URINALYSIS, ROUTINE W REFLEX MICROSCOPIC
Bacteria, UA: NONE SEEN
Bilirubin Urine: NEGATIVE
Glucose, UA: >=500 mg/dL — AB
Ketones, ur: 20 mg/dL — AB
Leukocytes,Ua: NEGATIVE
Nitrite: NEGATIVE
Protein, ur: NEGATIVE mg/dL
Specific Gravity, Urine: 1.018 (ref 1.005–1.030)
pH: 5 (ref 5.0–8.0)

## 2020-06-21 LAB — LACTIC ACID, PLASMA: Lactic Acid, Venous: 5.7 mmol/L (ref 0.5–1.9)

## 2020-06-21 LAB — TROPONIN I (HIGH SENSITIVITY): Troponin I (High Sensitivity): 7 ng/L (ref <18)

## 2020-06-21 LAB — OSMOLALITY: Osmolality: 371 mOsm/kg (ref 275–295)

## 2020-06-21 MED ORDER — LACTATED RINGERS IV BOLUS
1000.0000 mL | INTRAVENOUS | Status: AC
  Administered 2020-06-21 (×2): 1000 mL via INTRAVENOUS

## 2020-06-21 MED ORDER — DEXTROSE 50 % IV SOLN
0.0000 mL | INTRAVENOUS | Status: DC | PRN
  Administered 2020-06-26: 25 mL via INTRAVENOUS
  Filled 2020-06-21: qty 50

## 2020-06-21 MED ORDER — DEXTROSE IN LACTATED RINGERS 5 % IV SOLN: INTRAVENOUS | Status: DC

## 2020-06-21 MED ORDER — MORPHINE SULFATE (PF) 4 MG/ML IV SOLN
4.0000 mg | Freq: Once | INTRAVENOUS | Status: AC
  Administered 2020-06-21: 4 mg via INTRAVENOUS
  Filled 2020-06-21: qty 1

## 2020-06-21 MED ORDER — CALCIUM GLUCONATE-NACL 1-0.675 GM/50ML-% IV SOLN
1.0000 g | Freq: Once | INTRAVENOUS | Status: AC
  Administered 2020-06-21: 1000 mg via INTRAVENOUS
  Filled 2020-06-21: qty 50

## 2020-06-21 MED ORDER — INSULIN REGULAR(HUMAN) IN NACL 100-0.9 UT/100ML-% IV SOLN
INTRAVENOUS | Status: DC
  Administered 2020-06-22: 5 [IU]/h via INTRAVENOUS

## 2020-06-21 MED ORDER — INSULIN REGULAR(HUMAN) IN NACL 100-0.9 UT/100ML-% IV SOLN
INTRAVENOUS | Status: DC
  Administered 2020-06-21: 5 [IU]/h via INTRAVENOUS
  Filled 2020-06-21: qty 100

## 2020-06-21 MED ORDER — DEXTROSE 50 % IV SOLN: 0.0000 mL | INTRAVENOUS | Status: DC | PRN

## 2020-06-21 MED ORDER — HEPARIN SODIUM (PORCINE) 5000 UNIT/ML IJ SOLN
5000.0000 [IU] | Freq: Three times a day (TID) | INTRAMUSCULAR | Status: DC
  Administered 2020-06-22 – 2020-06-26 (×11): 5000 [IU] via SUBCUTANEOUS
  Filled 2020-06-21 (×12): qty 1

## 2020-06-21 MED ORDER — LACTATED RINGERS IV SOLN: INTRAVENOUS | Status: DC

## 2020-06-21 NOTE — Progress Notes (Signed)
06/21/2020 1200 Attempted call to pt and states he is sick and will come back to hospital. Pt is not insured and does not have a PCP. Will arrange follow up appt and pt's meds need to go to [facility] were he can purchase at a discounted rate his refills. ED provider updated. Isidoro Donning RN CCM, [facility] ED TOC CM [phone]

## 2020-06-21 NOTE — ED Triage Notes (Signed)
BIB EMS c/o hyperglycemia. Has not been taking insulin.

## 2020-06-21 NOTE — ED Notes (Signed)
Pt vomited in the lobby, while staff was cleaning up the area the pt got up out of the wheelchair, tripped and fell. The patient did not hit his head, pt fell on his back. Staff members assisted him back up and into a chair.

## 2020-06-21 NOTE — ED Provider Notes (Signed)
[facility] EMERGENCY DEPARTMENT Provider Note   CSN: [id number] Arrival date & time: 06/21/20  1739     History Chief Complaint  Patient presents with  . Hyperglycemia    Mark [name] is a 34 y.o. male.  He is a type I diabetic with frequent admissions for DKA.  He was last here 2 days ago.  He said his blood sugars are elevated and he is dizzy and vomiting.  Apparently while he was in the waiting room he also fell out of the wheelchair.  Has not taken his insulin in for 5 days because he has no funds to afford it.  Denies any alcohol or drugs.  Is a smoker and has not had his Covid vaccine.  The history is provided by the patient.  Hyperglycemia Severity:  Severe Onset quality:  Gradual Timing:  Unable to specify Progression:  Unchanged Chronicity:  Recurrent Diabetes status:  Controlled with insulin Context: noncompliance   Relieved by:  Nothing Ineffective treatments:  None tried Associated symptoms: dehydration, dizziness, increased appetite, increased thirst, nausea, polyuria, vomiting and weakness   Associated symptoms: no abdominal pain, no altered mental status, no chest pain, no dysuria, no fever and no shortness of breath   Risk factors: hx of DKA        Past Medical History:  Diagnosis Date  . Anxiety   . Bipolar disorder ([place])   . Depression   . Diabetes mellitus without complication ([place])   . Diabetic polyneuropathy associated with type 1 diabetes mellitus ([place])   . Hydrocephalus ([place])   . Kidney stones   . Seizures [facility])     Patient Active Problem List   Diagnosis Date Noted  . Hyperkalemia 04/10/2020  . AKI (acute kidney injury) ([place]) 04/10/2020  . Left leg cellulitis 03/23/2020  . Bipolar I disorder, current or most recent episode depressed, in partial remission ([place])   . MDD (major depressive disorder), recurrent episode, severe ([place]) 03/13/2020  . Drug overdose, intentional ([place]) 03/05/2020  . Drug overdose 03/05/2020  .  Drug abuse ([place]) 03/05/2020  . Attempted suicide ([place]) 03/05/2020  . Insulin overdose   . Hypoglycemia 02/03/2020  . Hypothyroidism 02/02/2020  . Diabetic polyneuropathy associated with type 1 diabetes mellitus ([place]) 02/02/2020  . Type 1 diabetes mellitus with hyperosmolar hyperglycemic state (HHS) ([place]) 12/30/2019  . DKA (diabetic ketoacidosis) ([place]) 12/29/2019  . Homelessness 12/29/2019  . Marijuana abuse 12/29/2019  . Tobacco dependence 12/29/2019  . Seizure disorder ([place]) 12/29/2019  . Suicidal ideation   . Bipolar I disorder, most recent episode depressed, severe without psychotic features ([place])   . Adjustment disorder with depressed mood 12/07/2017  . Noncompliance 10/17/2017  . Dysthymia 10/08/2017  . Personality disorder in adult [facility]) 09/26/2017  . 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 Obstructive hydrocephalus ([place]) 09/19/2017  . MDD (major depressive disorder) 08/22/2017  . Severe recurrent major depression with psychotic features ([place]) 08/19/2017  . Abdominal pain 08/18/2017  . Hyponatremia 08/18/2017  . DKA (diabetic ketoacidoses) ([place]) 11/24/2015  . Diabetic keto-acidosis ([place]) 10/23/2015    Past Surgical History:  Procedure Laterality Date  . KIDNEY STONE SURGERY         Family History  Problem Relation Age of Onset  . Breast cancer Mother     Social History   Tobacco Use  . Smoking status: Current Every Day Smoker    Packs/day: 1.00    Years: 20.00    Pack years: 20.00    Types: Cigarettes  .  Smokeless tobacco: Never Used  Vaping Use  . Vaping Use: Never used  Substance Use Topics  . Alcohol use: Yes    Comment: occassional   . Drug use: Yes    Frequency: 7.0 times per week    Types: Marijuana    Comment: smoked 2-3 days ago, usually smokes daily    Home Medications Prior to Admission medications   Medication Sig Start Date End Date Taking? Authorizing Provider  atorvastatin (LIPITOR) 20 MG tablet Take 1 tablet (20 mg total) by mouth  daily. Patient not taking: Reported on 06/20/2020 04/12/20   Bonnielee Haff, MD  blood glucose meter kit and supplies KIT Dispense based on patient and insurance preference. Use up to four times daily as directed. (FOR ICD-9 250.00, 250.01). Patient not taking: Reported on 06/20/2020 04/22/20   Raiford Noble Latif, DO  divalproex (DEPAKOTE) 250 MG DR tablet Take 3 tablets (750 mg total) by mouth every evening. For mood stabilization Patient not taking: Reported on 06/20/2020 04/12/20   Bonnielee Haff, MD  divalproex (DEPAKOTE) 500 MG DR tablet Take 1 tablet (500 mg total) by mouth daily. For mood stabilization Patient not taking: Reported on 06/20/2020 04/12/20   Bonnielee Haff, MD  escitalopram (LEXAPRO) 20 MG tablet Take 1 tablet (20 mg total) by mouth daily. For depression Patient not taking: Reported on 06/20/2020 04/12/20   Bonnielee Haff, MD  gabapentin (NEURONTIN) 100 MG capsule Take 100 mg by mouth 2 (two) times daily. Patient not taking: Reported on 06/20/2020    [provider]  insulin detemir (LEVEMIR) 100 UNIT/ML FlexPen Inject 30 Units into the skin at bedtime. Patient not taking: Reported on 06/20/2020 04/22/20   Raiford Noble Latif, DO  Insulin Pen Needle (PEN NEEDLES) 31G X 5 MM MISC 1 Container by Does not apply route 4 (four) times daily -  with meals and at bedtime. 04/22/20   Raiford Noble Latif, DO  Insulin Regular Human (NOVOLIN R FLEXPEN) 100 UNIT/ML SOPN Inject 10 Units as directed with breakfast, with lunch, and with evening meal. 04/22/20 05/22/20  Raiford Noble Latif, DO  levothyroxine (SYNTHROID) 100 MCG tablet Take 1 tablet (100 mcg total) by mouth daily before breakfast. Patient not taking: Reported on 06/20/2020 04/12/20   Bonnielee Haff, MD  QUEtiapine (SEROQUEL) 300 MG tablet Take 1 tablet (300 mg total) by mouth at bedtime. For mood control Patient not taking: Reported on 06/20/2020 04/12/20   Bonnielee Haff, MD  insulin aspart (NOVOLOG) 100 UNIT/ML injection Inject 10  Units into the skin 3 (three) times daily with meals. Patient not taking: Reported on 03/23/2020 03/13/20 04/04/20  Antonieta Pert, MD  pregabalin (LYRICA) 150 MG capsule Take 1 capsule (150 mg total) by mouth 2 (two) times daily. Patient not taking: Reported on 03/23/2020 12/31/19 04/04/20  Damita Lack, MD    Allergies    Mushroom extract complex, Penicillins, Sulfa antibiotics, and Clindamycin/lincomycin  Review of Systems   Review of Systems  Constitutional: Negative for fever.  HENT: Negative for sore throat.   Eyes: Negative for visual disturbance.  Respiratory: Negative for shortness of breath.   Cardiovascular: Negative for chest pain.  Gastrointestinal: Positive for nausea and vomiting. Negative for abdominal pain.  Endocrine: Positive for polydipsia and polyuria.  Genitourinary: Positive for frequency. Negative for dysuria.  Musculoskeletal: Negative for neck pain.  Skin: Negative for rash.  Neurological: Positive for dizziness and weakness.    Physical Exam Updated Vital Signs BP 110/71   Pulse (!) 112   Temp 98.2  F (36.8 C) (Oral)   Resp 20   Ht _0  (1.88 m)   Wt 113.4 kg   SpO2 98%   BMI 32.10 kg/m   Physical Exam Vitals and nursing note reviewed.  Constitutional:      Appearance: He is well-developed.     Comments: Disheveled  HENT:     Head: Normocephalic and atraumatic.  Eyes:     Conjunctiva/sclera: Conjunctivae normal.  Cardiovascular:     Rate and Rhythm: Regular rhythm. Tachycardia present.     Heart sounds: No murmur heard.   Pulmonary:     Effort: Pulmonary effort is normal. No respiratory distress.     Breath sounds: Normal breath sounds.  Abdominal:     Palpations: Abdomen is soft.     Tenderness: There is no abdominal tenderness. There is no guarding or rebound.  Musculoskeletal:        General: Normal range of motion.     Cervical back: Neck supple.     Right lower leg: No edema.     Left lower leg: No edema.  Skin:    General: Skin  is warm and dry.     Capillary Refill: Capillary refill takes less than 2 seconds.  Neurological:     General: No focal deficit present.     Mental Status: He is alert.     ED Results / Procedures / Treatments   Labs (all labs ordered are listed, but only abnormal results are displayed) Labs Reviewed  BASIC METABOLIC PANEL - Abnormal; Notable for the following components:      Result Value   Sodium 126 (*)    Potassium 6.5 (*)    Chloride 83 (*)    CO2 8 (*)    Glucose, Bld 1,178 (*)    BUN 38 (*)    Creatinine, Ser 2.82 (*)    GFR calc non Af Amer 28 (*)    GFR calc Af Amer 32 (*)    Anion gap 35 (*)    All other components within normal limits  CBC - Abnormal; Notable for the following components:   WBC 30.6 (*)    MCHC 29.6 (*)    Platelets 473 (*)    All other components within normal limits  URINALYSIS, ROUTINE W REFLEX MICROSCOPIC - Abnormal; Notable for the following components:   Glucose, UA >=500 (*)    Hgb urine dipstick SMALL (*)    Ketones, ur 20 (*)    All other components within normal limits  BETA-HYDROXYBUTYRIC ACID - Abnormal; Notable for the following components:   Beta-Hydroxybutyric Acid >8.00 (*)    All other components within normal limits  OSMOLALITY - Abnormal; Notable for the following components:   Osmolality 371 (*)    All other components within normal limits  LACTIC ACID, PLASMA - Abnormal; Notable for the following components:   Lactic Acid, Venous 5.7 (*)    All other components within normal limits  LACTIC ACID, PLASMA - Abnormal; Notable for the following components:   Lactic Acid, Venous 6.4 (*)    All other components within normal limits  BASIC METABOLIC PANEL - Abnormal; Notable for the following components:   Sodium 121 (*)    Potassium >7.5 (*)    Chloride 76 (*)    CO2 7 (*)    Glucose, Bld 1,353 (*)    BUN 38 (*)    Creatinine, Ser 3.55 (*)    Calcium 8.5 (*)    GFR calc non Af  Amer 21 (*)    GFR calc Af Amer 24 (*)     Anion gap 38 (*)    All other components within normal limits  BETA-HYDROXYBUTYRIC ACID - Abnormal; Notable for the following components:   Beta-Hydroxybutyric Acid 5.58 (*)    All other components within normal limits  BASIC METABOLIC PANEL - Abnormal; Notable for the following components:   Sodium 133 (*)    Potassium 6.0 (*)    Chloride 91 (*)    CO2 11 (*)    Glucose, Bld 729 (*)    BUN 37 (*)    Creatinine, Ser 3.17 (*)    GFR calc non Af Amer 24 (*)    GFR calc Af Amer 28 (*)    Anion gap 31 (*)    All other components within normal limits  LACTIC ACID, PLASMA - Abnormal; Notable for the following components:   Lactic Acid, Venous 2.8 (*)    All other components within normal limits  CBG MONITORING, ED - Abnormal; Notable for the following components:   Glucose-Capillary >600 (*)    All other components within normal limits  CBG MONITORING, ED - Abnormal; Notable for the following components:   Glucose-Capillary >600 (*)    All other components within normal limits  CBG MONITORING, ED - Abnormal; Notable for the following components:   Glucose-Capillary >600 (*)    All other components within normal limits  I-STAT VENOUS BLOOD GAS, ED - Abnormal; Notable for the following components:   pH, Ven 7.068 (*)    pCO2, Ven 26.8 (*)    pO2, Ven 58.0 (*)    Bicarbonate 7.7 (*)    TCO2 9 (*)    Acid-base deficit 21.0 (*)    Sodium 118 (*)    Potassium 8.0 (*)    Calcium, Ion 0.95 (*)    All other components within normal limits  CBG MONITORING, ED - Abnormal; Notable for the following components:   Glucose-Capillary >600 (*)    All other components within normal limits  CBG MONITORING, ED - Abnormal; Notable for the following components:   Glucose-Capillary >600 (*)    All other components within normal limits  CBG MONITORING, ED - Abnormal; Notable for the following components:   Glucose-Capillary >600 (*)    All other components within normal limits  CBG MONITORING, ED  - Abnormal; Notable for the following components:   Glucose-Capillary >600 (*)    All other components within normal limits  CBG MONITORING, ED - Abnormal; Notable for the following components:   Glucose-Capillary >600 (*)    All other components within normal limits  CBG MONITORING, ED - Abnormal; Notable for the following components:   Glucose-Capillary 542 (*)    All other components within normal limits  CBG MONITORING, ED - Abnormal; Notable for the following components:   Glucose-Capillary 465 (*)    All other components within normal limits  CBG MONITORING, ED - Abnormal; Notable for the following components:   Glucose-Capillary 305 (*)    All other components within normal limits  CBG MONITORING, ED - Abnormal; Notable for the following components:   Glucose-Capillary 202 (*)    All other components within normal limits  TROPONIN I (HIGH SENSITIVITY) - Abnormal; Notable for the following components:   Troponin I (High Sensitivity) 28 (*)    All other components within normal limits  CULTURE, BLOOD (ROUTINE X 2)  CULTURE, BLOOD (ROUTINE X 2)  SARS CORONAVIRUS 2 BY RT PCR [facility]  ORDER, PERFORMED IN [facility])  PROTIME-INR  APTT  PROCALCITONIN  BLOOD GAS, VENOUS  BASIC METABOLIC PANEL  BASIC METABOLIC PANEL  BASIC METABOLIC PANEL  BASIC METABOLIC PANEL  BETA-HYDROXYBUTYRIC ACID  BETA-HYDROXYBUTYRIC ACID  HEMOGLOBIN A1C  LACTIC ACID, PLASMA  TROPONIN I (HIGH SENSITIVITY)    EKG EKG Interpretation  Date/Time:  Tuesday June 21 2020 22:16:23 EDT Ventricular Rate:  124 PR Interval:    QRS Duration: 125 QT Interval:  435 QTC Calculation: 625 R Axis:   71 Text Interpretation: Sinus tachycardia Probable left atrial enlargement IVCD, consider atypical RBBB ST elevation, consider anterolateral injury Confirmed by Aletta Edouard [phone]) on 06/21/2020 10:17:48 PM Also confirmed by Aletta Edouard [phone]), editor Gean Quint [phone])  on 06/22/2020 7:24:03  AM   Radiology DG CHEST PORT 1 VIEW  Result Date: 06/22/2020 CLINICAL DATA:  Hyperglycemia EXAM: PORTABLE CHEST 1 VIEW COMPARISON:  04/30/2020 FINDINGS: Single frontal view of the chest demonstrates an unremarkable cardiac silhouette. No airspace disease, effusion, or pneumothorax. No acute bony abnormality. IMPRESSION: 1. No acute intrathoracic process. Electronically Signed   By: Randa Ngo M.D.   On: 06/22/2020 01:05    Procedures Procedures (including critical care time)  Medications Ordered in ED Medications  heparin injection 5,000 Units (has no administration in time range)  insulin regular, human (MYXREDLIN) 100 units/ 100 mL infusion (1.1 Units/hr Intravenous Rate/Dose Change 06/22/20 0917)  lactated ringers infusion (0 mLs Intravenous Paused 06/22/20 0917)  dextrose 5 % in lactated ringers infusion ( Intravenous New Bag/Given 06/22/20 0918)  dextrose 50 % solution 0-50 mL (has no administration in time range)  escitalopram (LEXAPRO) tablet 20 mg (has no administration in time range)  atorvastatin (LIPITOR) tablet 20 mg (has no administration in time range)  QUEtiapine (SEROQUEL) tablet 300 mg (300 mg Oral Given 06/22/20 0228)  vancomycin (VANCOREADY) IVPB 2000 mg/400 mL (has no administration in time range)  aztreonam (AZACTAM) 1 g in sodium chloride 0.9 % 100 mL IVPB (1 g Intravenous New Bag/Given 06/22/20 0902)  acetaminophen (TYLENOL) tablet 650 mg (650 mg Oral Given 06/22/20 0404)  valproate (DEPACON) 500 mg in dextrose 5 % 50 mL IVPB (has no administration in time range)  levothyroxine (SYNTHROID, LEVOTHROID) injection 50 mcg (has no administration in time range)  lactated ringers bolus 1,000 mL (0 mLs Intravenous Stopped 06/22/20 0004)  calcium gluconate 1 g/ 50 mL sodium chloride IVPB (0 g Intravenous Stopped 06/22/20 0003)  morphine 4 MG/ML injection 4 mg (4 mg Intravenous Given 06/21/20 2226)  lactated ringers bolus 1,000 mL (0 mLs Intravenous Stopped 06/22/20 0229)  aztreonam  (AZACTAM) 2 g in sodium chloride 0.9 % 100 mL IVPB (0 g Intravenous Stopped 06/22/20 0229)  vancomycin (VANCOREADY) IVPB 2000 mg/400 mL (0 mg Intravenous Stopped 06/22/20 0442)    ED Course  I have reviewed the triage vital signs and the nursing notes.  Pertinent labs & imaging results that were available during my care of the patient were reviewed by me and considered in my medical decision making (see chart for details).  Clinical Course as of Jun 22 1025  Tue Jun 21, 2020  2208 Patient has poor IV access.  I attempted to peripheral ultrasound-guided lines without success.  Patient become increasingly belligerent.  IV team here to try to get an IV stick around.   [MB]  2315 IV team was established 3 IVs and fluids and calcium and insulin being started.  Heart rate down to 106.   [MB]  Clinical Course User Index [MB] Hayden Rasmussen, MD   MDM Rules/Calculators/A&P                         This patient complains of elevated blood sugars nausea vomiting fall; this involves an extensive number of treatment Options and is a complaint that carries with it a high risk of complications and Morbidity. The differential includes DKA, HH NK, metabolic derangement, dehydration, AKI  I ordered, reviewed and interpreted labs, which included CBC with markedly elevated white count, normal hemoglobin, chemistries markedly abnormal with a low sodium likely pseudohyponatremia, critically elevated potassium, low bicarb elevated glucose consistent with DKA, elevated creatinine consistent with an AKI, elevated lactate elevated beta hydroxybutyrate.  Covid testing negative I ordered medication  IV calcium and IV insulin for his hyperkalemia, IV fluids, pain medication  I ordered imaging studies which included chest x-ray and I independently    visualized and interpreted imaging which showed no acute infiltrates Previous records obtained and reviewed in epic, frequent presentations for hyperglycemia in the  setting of noncompliance, frequent admissions for DKA  Critical Interventions: Management of hyperkalemia with IV calcium IV insulin, management of DKA and AKI with IV fluids and IV insulin.  Patient will need to be admitted to the hospital for further management of his significant metabolic derangement.  After the interventions stated above, I reevaluated the patient and found patient's tachycardia to be improved.  He will need to be admitted to the hospital.  Signed out to oncoming provider Dr. Waverly Ferrari to talk with the hospitalist which has been paged for securing admission.  Mark [name] was evaluated in Emergency Department on 06/21/2020 for the symptoms described in the history of present illness. He was evaluated in the context of the global COVID-19 pandemic, which necessitated consideration that the patient might be at risk for infection with the SARS-CoV-2 virus that causes COVID-19. Institutional protocols and algorithms that pertain to the evaluation of patients at risk for COVID-19 are in a state of rapid change based on information released by regulatory bodies including the CDC and federal and state organizations. These policies and algorithms were followed during the patient's care in the ED.   Final Clinical Impression(s) / ED Diagnoses Final diagnoses:  Diabetic ketoacidosis without coma associated with type 1 diabetes mellitus ([place])  Hyperkalemia  AKI (acute kidney injury) ([place])    Rx / DC Orders ED Discharge Orders    None       Hayden Rasmussen, MD 06/22/20 [phone]

## 2020-06-22 ENCOUNTER — Other Ambulatory Visit: Payer: Self-pay

## 2020-06-22 ENCOUNTER — Inpatient Hospital Stay (HOSPITAL_COMMUNITY): Payer: Self-pay

## 2020-06-22 DIAGNOSIS — Z590 Homelessness: Secondary | ICD-10-CM | POA: Diagnosis not present

## 2020-06-22 DIAGNOSIS — N179 Acute kidney failure, unspecified: Secondary | ICD-10-CM | POA: Diagnosis not present

## 2020-06-22 DIAGNOSIS — E1065 Type 1 diabetes mellitus with hyperglycemia: Secondary | ICD-10-CM

## 2020-06-22 DIAGNOSIS — F314 Bipolar disorder, current episode depressed, severe, without psychotic features: Secondary | ICD-10-CM

## 2020-06-22 DIAGNOSIS — R6510 Systemic inflammatory response syndrome (SIRS) of non-infectious origin without acute organ dysfunction: Secondary | ICD-10-CM

## 2020-06-22 DIAGNOSIS — E1010 Type 1 diabetes mellitus with ketoacidosis without coma: Secondary | ICD-10-CM

## 2020-06-22 DIAGNOSIS — D72829 Elevated white blood cell count, unspecified: Secondary | ICD-10-CM | POA: Diagnosis present

## 2020-06-22 DIAGNOSIS — E875 Hyperkalemia: Secondary | ICD-10-CM

## 2020-06-22 DIAGNOSIS — E1069 Type 1 diabetes mellitus with other specified complication: Secondary | ICD-10-CM

## 2020-06-22 DIAGNOSIS — G40909 Epilepsy, unspecified, not intractable, without status epilepticus: Secondary | ICD-10-CM

## 2020-06-22 LAB — PROCALCITONIN
Procalcitonin: 4.5 ng/mL
Procalcitonin: 12.34 ng/mL

## 2020-06-22 LAB — BASIC METABOLIC PANEL
Anion gap: 35 — ABNORMAL HIGH (ref 5–15)
Anion gap: 38 — ABNORMAL HIGH (ref 5–15)
Anion gap: 31 — ABNORMAL HIGH (ref 5–15)
Anion gap: 20 — ABNORMAL HIGH (ref 5–15)
Anion gap: 17 — ABNORMAL HIGH (ref 5–15)
Anion gap: 12 (ref 5–15)
BUN: 38 mg/dL — ABNORMAL HIGH (ref 6–20)
BUN: 38 mg/dL — ABNORMAL HIGH (ref 6–20)
BUN: 37 mg/dL — ABNORMAL HIGH (ref 6–20)
BUN: 33 mg/dL — ABNORMAL HIGH (ref 6–20)
BUN: 30 mg/dL — ABNORMAL HIGH (ref 6–20)
BUN: 24 mg/dL — ABNORMAL HIGH (ref 6–20)
CO2: 8 mmol/L — ABNORMAL LOW (ref 22–32)
CO2: 7 mmol/L — ABNORMAL LOW (ref 22–32)
CO2: 11 mmol/L — ABNORMAL LOW (ref 22–32)
CO2: 19 mmol/L — ABNORMAL LOW (ref 22–32)
CO2: 21 mmol/L — ABNORMAL LOW (ref 22–32)
CO2: 23 mmol/L (ref 22–32)
Calcium: 9.2 mg/dL (ref 8.9–10.3)
Calcium: 8.5 mg/dL — ABNORMAL LOW (ref 8.9–10.3)
Calcium: 9.3 mg/dL (ref 8.9–10.3)
Calcium: 9 mg/dL (ref 8.9–10.3)
Calcium: 9.3 mg/dL (ref 8.9–10.3)
Calcium: 9.2 mg/dL (ref 8.9–10.3)
Chloride: 83 mmol/L — ABNORMAL LOW (ref 98–111)
Chloride: 76 mmol/L — ABNORMAL LOW (ref 98–111)
Chloride: 91 mmol/L — ABNORMAL LOW (ref 98–111)
Chloride: 100 mmol/L (ref 98–111)
Chloride: 103 mmol/L (ref 98–111)
Chloride: 104 mmol/L (ref 98–111)
Creatinine, Ser: 2.82 mg/dL — ABNORMAL HIGH (ref 0.61–1.24)
Creatinine, Ser: 3.55 mg/dL — ABNORMAL HIGH (ref 0.61–1.24)
Creatinine, Ser: 3.17 mg/dL — ABNORMAL HIGH (ref 0.61–1.24)
Creatinine, Ser: 2.18 mg/dL — ABNORMAL HIGH (ref 0.61–1.24)
Creatinine, Ser: 2.05 mg/dL — ABNORMAL HIGH (ref 0.61–1.24)
Creatinine, Ser: 1.61 mg/dL — ABNORMAL HIGH (ref 0.61–1.24)
GFR calc Af Amer: 32 mL/min — ABNORMAL LOW (ref >60)
GFR calc Af Amer: 24 mL/min — ABNORMAL LOW (ref >60)
GFR calc Af Amer: 28 mL/min — ABNORMAL LOW (ref >60)
GFR calc Af Amer: 44 mL/min — ABNORMAL LOW (ref >60)
GFR calc Af Amer: 47 mL/min — ABNORMAL LOW (ref >60)
GFR calc Af Amer: >60 mL/min (ref >60)
GFR calc non Af Amer: 28 mL/min — ABNORMAL LOW (ref >60)
GFR calc non Af Amer: 21 mL/min — ABNORMAL LOW (ref >60)
GFR calc non Af Amer: 24 mL/min — ABNORMAL LOW (ref >60)
GFR calc non Af Amer: 38 mL/min — ABNORMAL LOW (ref >60)
GFR calc non Af Amer: 40 mL/min — ABNORMAL LOW (ref >60)
GFR calc non Af Amer: 54 mL/min — ABNORMAL LOW (ref >60)
Glucose, Bld: 1178 mg/dL (ref 70–99)
Glucose, Bld: 1353 mg/dL (ref 70–99)
Glucose, Bld: 729 mg/dL (ref 70–99)
Glucose, Bld: 275 mg/dL — ABNORMAL HIGH (ref 70–99)
Glucose, Bld: 246 mg/dL — ABNORMAL HIGH (ref 70–99)
Glucose, Bld: 210 mg/dL — ABNORMAL HIGH (ref 70–99)
Potassium: 6.5 mmol/L (ref 3.5–5.1)
Potassium: >7.5 mmol/L (ref 3.5–5.1)
Potassium: 6 mmol/L — ABNORMAL HIGH (ref 3.5–5.1)
Potassium: 4.1 mmol/L (ref 3.5–5.1)
Potassium: 4.7 mmol/L (ref 3.5–5.1)
Potassium: 3.8 mmol/L (ref 3.5–5.1)
Sodium: 126 mmol/L — ABNORMAL LOW (ref 135–145)
Sodium: 121 mmol/L — ABNORMAL LOW (ref 135–145)
Sodium: 133 mmol/L — ABNORMAL LOW (ref 135–145)
Sodium: 139 mmol/L (ref 135–145)
Sodium: 141 mmol/L (ref 135–145)
Sodium: 139 mmol/L (ref 135–145)

## 2020-06-22 LAB — CBG MONITORING, ED
Glucose-Capillary: 202 mg/dL — ABNORMAL HIGH (ref 70–99)
Glucose-Capillary: 207 mg/dL — ABNORMAL HIGH (ref 70–99)
Glucose-Capillary: 213 mg/dL — ABNORMAL HIGH (ref 70–99)
Glucose-Capillary: 235 mg/dL — ABNORMAL HIGH (ref 70–99)
Glucose-Capillary: 277 mg/dL — ABNORMAL HIGH (ref 70–99)
Glucose-Capillary: 305 mg/dL — ABNORMAL HIGH (ref 70–99)
Glucose-Capillary: 465 mg/dL — ABNORMAL HIGH (ref 70–99)
Glucose-Capillary: 542 mg/dL (ref 70–99)
Glucose-Capillary: >600 mg/dL (ref 70–99)
Glucose-Capillary: >600 mg/dL (ref 70–99)
Glucose-Capillary: >600 mg/dL (ref 70–99)
Glucose-Capillary: >600 mg/dL (ref 70–99)
Glucose-Capillary: >600 mg/dL (ref 70–99)
Glucose-Capillary: >600 mg/dL (ref 70–99)

## 2020-06-22 LAB — BETA-HYDROXYBUTYRIC ACID
Beta-Hydroxybutyric Acid: >8 mmol/L — ABNORMAL HIGH (ref 0.05–0.27)
Beta-Hydroxybutyric Acid: 5.58 mmol/L — ABNORMAL HIGH (ref 0.05–0.27)
Beta-Hydroxybutyric Acid: 0.91 mmol/L — ABNORMAL HIGH (ref 0.05–0.27)

## 2020-06-22 LAB — TROPONIN I (HIGH SENSITIVITY): Troponin I (High Sensitivity): 28 ng/L — ABNORMAL HIGH (ref <18)

## 2020-06-22 LAB — GLUCOSE, CAPILLARY
Glucose-Capillary: 177 mg/dL — ABNORMAL HIGH (ref 70–99)
Glucose-Capillary: 221 mg/dL — ABNORMAL HIGH (ref 70–99)
Glucose-Capillary: 160 mg/dL — ABNORMAL HIGH (ref 70–99)

## 2020-06-22 LAB — PROTIME-INR
INR: 1.2 (ref 0.8–1.2)
Prothrombin Time: 14.4 seconds (ref 11.4–15.2)

## 2020-06-22 LAB — HEMOGLOBIN A1C
Hgb A1c MFr Bld: 8.2 % — ABNORMAL HIGH (ref 4.8–5.6)
Mean Plasma Glucose: 188.64 mg/dL

## 2020-06-22 LAB — LACTIC ACID, PLASMA
Lactic Acid, Venous: 6.4 mmol/L (ref 0.5–1.9)
Lactic Acid, Venous: 2.8 mmol/L (ref 0.5–1.9)
Lactic Acid, Venous: 2 mmol/L (ref 0.5–1.9)

## 2020-06-22 LAB — SARS CORONAVIRUS 2 BY RT PCR (HOSPITAL ORDER, PERFORMED IN ~~LOC~~ HOSPITAL LAB): SARS Coronavirus 2: NEGATIVE

## 2020-06-22 LAB — APTT: aPTT: 28 seconds (ref 24–36)

## 2020-06-22 MED ORDER — INSULIN GLARGINE 100 UNIT/ML ~~LOC~~ SOLN
15.0000 [IU] | Freq: Two times a day (BID) | SUBCUTANEOUS | Status: DC
  Administered 2020-06-22 – 2020-06-23 (×3): 15 [IU] via SUBCUTANEOUS
  Filled 2020-06-22 (×6): qty 0.15

## 2020-06-22 MED ORDER — INSULIN ASPART 100 UNIT/ML ~~LOC~~ SOLN: 4.0000 [IU] | Freq: Three times a day (TID) | SUBCUTANEOUS | Status: DC

## 2020-06-22 MED ORDER — INSULIN ASPART 100 UNIT/ML ~~LOC~~ SOLN
0.0000 [IU] | Freq: Three times a day (TID) | SUBCUTANEOUS | Status: DC
  Administered 2020-06-22: 3 [IU] via SUBCUTANEOUS
  Administered 2020-06-23 (×2): 2 [IU] via SUBCUTANEOUS
  Administered 2020-06-24: 7 [IU] via SUBCUTANEOUS
  Administered 2020-06-24: 2 [IU] via SUBCUTANEOUS
  Administered 2020-06-25: 5 [IU] via SUBCUTANEOUS
  Administered 2020-06-25 – 2020-06-26 (×2): 3 [IU] via SUBCUTANEOUS

## 2020-06-22 MED ORDER — SODIUM CHLORIDE 0.9 % IV SOLN
2.0000 g | INTRAVENOUS | Status: DC
  Administered 2020-06-23 – 2020-06-24 (×3): 2 g via INTRAVENOUS
  Filled 2020-06-22 (×2): qty 2
  Filled 2020-06-22: qty 20
  Filled 2020-06-22: qty 2

## 2020-06-22 MED ORDER — DIVALPROEX SODIUM 250 MG PO DR TAB: 500.0000 mg | DELAYED_RELEASE_TABLET | Freq: Every day | ORAL | Status: DC

## 2020-06-22 MED ORDER — DIVALPROEX SODIUM 250 MG PO DR TAB: 750.0000 mg | DELAYED_RELEASE_TABLET | Freq: Every evening | ORAL | Status: DC

## 2020-06-22 MED ORDER — LACTATED RINGERS IV BOLUS
1000.0000 mL | Freq: Once | INTRAVENOUS | Status: AC
  Administered 2020-06-22: 1000 mL via INTRAVENOUS

## 2020-06-22 MED ORDER — ATORVASTATIN CALCIUM 10 MG PO TABS
20.0000 mg | ORAL_TABLET | Freq: Every day | ORAL | Status: DC
  Administered 2020-06-22 – 2020-06-26 (×5): 20 mg via ORAL
  Filled 2020-06-22 (×5): qty 2

## 2020-06-22 MED ORDER — INSULIN ASPART 100 UNIT/ML ~~LOC~~ SOLN
0.0000 [IU] | Freq: Every day | SUBCUTANEOUS | Status: DC
  Administered 2020-06-23: 2 [IU] via SUBCUTANEOUS
  Administered 2020-06-24: 3 [IU] via SUBCUTANEOUS
  Administered 2020-06-25: 2 [IU] via SUBCUTANEOUS

## 2020-06-22 MED ORDER — SODIUM CHLORIDE 0.9 % IV SOLN
2.0000 g | Freq: Once | INTRAVENOUS | Status: AC
  Administered 2020-06-22: 2 g via INTRAVENOUS
  Filled 2020-06-22: qty 2

## 2020-06-22 MED ORDER — INSULIN GLARGINE 100 UNIT/ML ~~LOC~~ SOLN
20.0000 [IU] | Freq: Two times a day (BID) | SUBCUTANEOUS | Status: DC
  Administered 2020-06-22: 20 [IU] via SUBCUTANEOUS
  Filled 2020-06-22 (×2): qty 0.2

## 2020-06-22 MED ORDER — VALPROATE SODIUM 500 MG/5ML IV SOLN
500.0000 mg | Freq: Two times a day (BID) | INTRAVENOUS | Status: DC
  Administered 2020-06-22 (×2): 500 mg via INTRAVENOUS
  Filled 2020-06-22 (×4): qty 5

## 2020-06-22 MED ORDER — VANCOMYCIN HCL 1250 MG/250ML IV SOLN
1250.0000 mg | INTRAVENOUS | Status: DC
  Administered 2020-06-23 – 2020-06-24 (×2): 1250 mg via INTRAVENOUS
  Filled 2020-06-22 (×2): qty 250

## 2020-06-22 MED ORDER — SODIUM CHLORIDE 0.9 % IV SOLN
1.0000 g | Freq: Three times a day (TID) | INTRAVENOUS | Status: DC
  Administered 2020-06-22 (×2): 1 g via INTRAVENOUS
  Filled 2020-06-22 (×4): qty 1

## 2020-06-22 MED ORDER — VANCOMYCIN HCL 2000 MG/400ML IV SOLN: 2000.0000 mg | INTRAVENOUS | Status: DC

## 2020-06-22 MED ORDER — ESCITALOPRAM OXALATE 20 MG PO TABS
20.0000 mg | ORAL_TABLET | Freq: Every day | ORAL | Status: DC
  Administered 2020-06-22 – 2020-06-26 (×5): 20 mg via ORAL
  Filled 2020-06-22 (×2): qty 1
  Filled 2020-06-22: qty 2
  Filled 2020-06-22 (×2): qty 1

## 2020-06-22 MED ORDER — QUETIAPINE FUMARATE 100 MG PO TABS
300.0000 mg | ORAL_TABLET | Freq: Every day | ORAL | Status: DC
  Administered 2020-06-22 – 2020-06-25 (×5): 300 mg via ORAL
  Filled 2020-06-22 (×2): qty 3
  Filled 2020-06-22 (×2): qty 1
  Filled 2020-06-22 (×2): qty 3

## 2020-06-22 MED ORDER — ACETAMINOPHEN 325 MG PO TABS
650.0000 mg | ORAL_TABLET | Freq: Four times a day (QID) | ORAL | Status: DC | PRN
  Administered 2020-06-22 – 2020-06-24 (×3): 650 mg via ORAL
  Filled 2020-06-22 (×3): qty 2

## 2020-06-22 MED ORDER — INSULIN ASPART 100 UNIT/ML ~~LOC~~ SOLN: 0.0000 [IU] | Freq: Three times a day (TID) | SUBCUTANEOUS | Status: DC

## 2020-06-22 MED ORDER — LEVOTHYROXINE SODIUM 100 MCG PO TABS: 100.0000 ug | ORAL_TABLET | Freq: Every day | ORAL | Status: DC

## 2020-06-22 MED ORDER — INSULIN ASPART 100 UNIT/ML ~~LOC~~ SOLN
4.0000 [IU] | Freq: Three times a day (TID) | SUBCUTANEOUS | Status: DC
  Administered 2020-06-22 – 2020-06-26 (×8): 4 [IU] via SUBCUTANEOUS

## 2020-06-22 MED ORDER — VANCOMYCIN HCL 2000 MG/400ML IV SOLN
2000.0000 mg | Freq: Once | INTRAVENOUS | Status: AC
  Administered 2020-06-22: 2000 mg via INTRAVENOUS
  Filled 2020-06-22: qty 400

## 2020-06-22 MED ORDER — LEVOTHYROXINE SODIUM 100 MCG/5ML IV SOLN: 50.0000 ug | Freq: Every day | INTRAVENOUS | Status: DC

## 2020-06-22 NOTE — Progress Notes (Signed)
Pt was started on vanc/aztreonam for r/o sepsis. He has gotten ceftriaxone and meropenem in the past without issues. D/w Dr. Sharolyn Douglas and we will change aztreonam to ceftriaxone.   Ulyses Southward, PharmD, BCIDP, AAHIVP, CPP Infectious Disease Pharmacist 06/22/2020 6:21 PM

## 2020-06-22 NOTE — Progress Notes (Signed)
Pharmacy Antibiotic Note  Mark [name] is a 34 y.o. male admitted on 06/21/2020 with hyperglycemia and leukocytosis, possible sepsis.  Pharmacy has been consulted for Vancomycin an Aztreonam dosing.  Plan: Vancomycin 2 g IV q48h Aztreonam 1 g IV q8h Height: 6\' 2"  (188 cm) Weight: 113.4 kg (250 lb) IBW/kg (Calculated) : 82.2  Temp (24hrs), Avg:98.2 F (36.8 C), Min:98.2 F (36.8 C), Max:98.2 F (36.8 C)  Recent Labs  Lab 06/19/20 2024 06/21/20 2004 06/21/20 2216  WBC 10.8* 30.6*  --   CREATININE 1.33* 2.82*  --   LATICACIDVEN  --   --  6.4*  5.7*    Estimated Creatinine Clearance: 48.5 mL/min (A) (by C-G formula based on SCr of 2.82 mg/dL (H)).    Allergies  Allergen Reactions  . Mushroom Extract Complex Hives, Itching and Nausea And Vomiting  . Penicillins Anaphylaxis, Hives and Swelling    Has patient had a PCN reaction causing immediate rash, facial/tongue/throat swelling, SOB or lightheadedness with hypotension: Yes Has patient had a PCN reaction causing severe rash involving mucus membranes or skin necrosis: No Has patient had a PCN reaction that required hospitalization: Yes Has patient had a PCN reaction occurring within the last 10 years: Yes If all of the above answers are "NO", then may proceed with Cephalosporin use. Tolerated ceftriaxone 03/24/20  . Sulfa Antibiotics Anaphylaxis and Hives  . Clindamycin/Lincomycin Hives    05/24/20 06/22/2020 12:48 AM

## 2020-06-22 NOTE — Progress Notes (Signed)
Pt  Arrived tom rm 24 from ED with insulin drip 1.4 ml/hr and Dextrose  5% LR 159ml/hr. Initiated pt on tele. Pt's BG=221. CHG and assessement performed. VSS. Clarified with MD to stop insulin drip and maintained fluid to SQ insulin. Stopped insulin drip and maintained fluid.   Lawson Radar, RN

## 2020-06-22 NOTE — ED Notes (Signed)
Pt given water, per Irving Burton - RN.

## 2020-06-22 NOTE — Progress Notes (Addendum)
Inpatient Diabetes Program Recommendations  AACE/ADA: New Consensus Statement on Inpatient Glycemic Control (2015)  Target Ranges:  Prepandial:   less than 140 mg/dL      Peak postprandial:   less than 180 mg/dL (1-2 hours)      Critically ill patients:  140 - 180 mg/dL   Lab Results  Component Value Date   GLUCAP 202 (H) 06/22/2020   HGBA1C 8.8 (H) 03/23/2020    Review of Glycemic Control Results for Mark [name], Mark [name] (MRN [id number]) as of 06/22/2020 10:03  Ref. Range 06/22/2020 03:49 06/22/2020 04:38 06/22/2020 05:39 06/22/2020 07:00 06/22/2020 09:11  Glucose-Capillary Latest Ref Range: 70 - 99 mg/dL >433 (HH) 295 (HH) 188 (H) 305 (H) 202 (H)   Diabetes history: DM 2 Outpatient Diabetes medications:  Levemir 30 units q HS, Novolin R 10 units tid (not taking?) Current orders for Inpatient glycemic control:  IV insulin- DKA order set  Inpatient Diabetes Program Recommendations:    Referral received.   This is patients 7th admission this year and 10th ED visit.  Patient is homeless and tends to come back in when insulin runs out.  In the past he has been set-up with [facility] however it does not appear that he has gone to any appointments.  He will not be able to continue Levemir if he does not have support from [facility] pharmacy and may need to resume 70/30?? However 70/30 will also require 25$ to purchase from [facility]??  Will follow.  Placed TOC consult as it appears most of his difficulties are related to homelessness and access to care.    Thanks,  Beryl Meager, RN, BC-ADM Inpatient Diabetes Coordinator Pager [phone] (8a-5p)  1440 Note that patient transitioning to Lantus 20 units bid.  Consider reducing to Lantus 15 units bid and consider Novolog sensitive correction plus Novolog 3 units meal coverage.  Still awaiting TOC consult regarding access to care and medications for patient.

## 2020-06-22 NOTE — Progress Notes (Signed)
Pharmacy Antibiotic Note  DAIRON PROCTER is a 34 y.o. male admitted on 06/21/2020 with hyperglycemia and leukocytosis, possible sepsis.  Pharmacy has been consulted for Vancomycin an Aztreonam dosing. Pt is afebrile but WBC is elevated at 30.6. SCr is elevated but trending down and lactic acid is also trending down.   Plan: Change vancomycin to 1250mg  IV Q24H Continue Aztreonam 1 g IV q8h F/u renal fxn, C&S, clinical status and trough at SS  Height: 6\' 2"  (188 cm) Weight: 113.4 kg (250 lb) IBW/kg (Calculated) : 82.2  Temp (24hrs), Avg:98.2 F (36.8 C), Min:98.2 F (36.8 C), Max:98.2 F (36.8 C)  Recent Labs  Lab 06/19/20 2024 06/19/20 2024 06/21/20 2004 06/21/20 2216 06/21/20 2250 06/22/20 0423 06/22/20 0557 06/22/20 1049 06/22/20 1230  WBC 10.8*  --  30.6*  --   --   --   --   --   --   CREATININE 1.33*   < > 2.82*  --  3.55* 3.17*  --  2.18* 2.05*  LATICACIDVEN  --   --   --  6.4*  5.7*  --   --  2.8* 2.0*  --    < > = values in this interval not displayed.    Estimated Creatinine Clearance: 66.7 mL/min (A) (by C-G formula based on SCr of 2.05 mg/dL (H)).    Allergies  Allergen Reactions  . Mushroom Extract Complex Hives, Itching and Nausea And Vomiting  . Penicillins Anaphylaxis, Hives and Swelling    Has patient had a PCN reaction causing immediate rash, facial/tongue/throat swelling, SOB or lightheadedness with hypotension: Yes Has patient had a PCN reaction causing severe rash involving mucus membranes or skin necrosis: No Has patient had a PCN reaction that required hospitalization: Yes Has patient had a PCN reaction occurring within the last 10 years: Yes If all of the above answers are "NO", then may proceed with Cephalosporin use. Tolerated ceftriaxone 03/24/20  . Sulfa Antibiotics Anaphylaxis and Hives  . Clindamycin/Lincomycin Hives    Griffin Dewilde, 08/22/20 06/22/2020 1:29 PM

## 2020-06-22 NOTE — ED Notes (Signed)
Pt agitated and continuously calling out b/c he wants food and drink. Admitting MD spoke to pt and informed pt that he is NPO. Pt reminded that he CANNOT have anything.

## 2020-06-22 NOTE — Progress Notes (Signed)
PROGRESS NOTE  Mark [name] BJY:[id number] DOB: 11/23/1985 DOA: 06/21/2020 PCP: Patient, No Pcp Per  HPI/Recap of past 24 hours: HPI from Dr Royanne FootsGardner Mark [name] is a 34 y.o. male with medical history significant of DM1, homeless, frequent admits for DKA.  Last in ED 2 days ago.  He presents to ED with dizziness and vomiting and c/o high BGLs.  He has not taken insulin for 5 days due to no funds to afford it. ED Course: Pt with DKA and HHS: BGL 1178, osm 371, BHB > 8.  AG 35, pH 7.0, lactate 6.4, WBC 30k, AKI with creat 2.8.  Patient admitted for further management.    Today, patient continues to ask for food, poor insight to his condition. Denies any chest pain, abdominal pain, cough, nausea/vomiting, fever/chills, dysuria, diarrhea.  Due to threats to sign AMA, patient was given a [place] sandwich in the ED while on insulin drip.    Assessment/Plan: Principal Problem:   DKA (diabetic ketoacidoses) (HCC) Active Problems:   Bipolar I disorder, most recent episode depressed, severe without psychotic features (HCC)   Homelessness   Seizure disorder (HCC)   Type 1 diabetes mellitus with hyperosmolar hyperglycemic state (HHS) (HCC)   AKI (acute kidney injury) (HCC)   Leukocytosis   DKA On admission, glucose greater than 1000, bicarb 7, anion gap 38 Not currently taking any insulin as unable to afford it Continue insulin drip, IV fluids N.p.o. for now Serial BMP, beta hydroxybutyric acid, CBGs Monitor closely  Possible sepsis Leukocytosis, tachycardic, tachypneic, lactic acidosis on admission ?? Possibly above signs from DKA UA with greater than 500 glucose, otherwise unremarkable BC x2 pending Chest x-ray unremarkable Continue empiric aztreonam, vancomycin for now Continue IV fluids Monitor closely  AKI Likely 2/2 DKA Continue IV fluids Serial BMP  Hypothyroidism Continue IV Synthroid  Seizure disorder Switch Depakote to IV Monitor closely  Bipolar  disorder Continue Lexapro, Seroquel once able  Obesity Lifestyle modification advised        Malnutrition Type:      Malnutrition Characteristics:      Nutrition Interventions:       Estimated body mass index is 32.1 kg/m as calculated from the following:   Height as of this encounter: 6\' 2"  (1.88 m).   Weight as of this encounter: 113.4 kg.     Code Status: Full  Family Communication: None at bedside  Disposition Plan: Status is: Inpatient  Remains inpatient appropriate because:Inpatient level of care appropriate due to severity of illness   Dispo: The patient is from: Homeless              Anticipated d/c is to: TBD              Anticipated d/c date is: 2 days              Patient currently is not medically stable to d/c.     Consultants:  None  Procedures:  None  Antimicrobials:  Aztreonam  Vancomycin  DVT prophylaxis: Heparin [place]   Objective: Vitals:   06/22/20 1000 06/22/20 1015 06/22/20 1030 06/22/20 1100  BP: 114/70 122/71 127/64   Pulse: (!) 122 (!) 111 (!) 113 (!) 109  Resp: 16 13 13 11   Temp:      TempSrc:      SpO2: 100% 99% 99% 98%  Weight:      Height:        Intake/Output Summary (Last 24 hours) at 06/22/2020 1228 Last data filed  at 06/22/2020 1050 Gross per 24 hour  Intake 100 ml  Output --  Net 100 ml   Filed Weights   06/21/20 1745  Weight: 113.4 kg    Exam:  General: NAD, agitated  Cardiovascular: S1, S2 present  Respiratory: CTAB  Abdomen: Soft, nontender, nondistended, bowel sounds present  Musculoskeletal: No bilateral pedal edema noted  Skin: Normal  Psychiatry:  Agitated mood    Data Reviewed: CBC: Recent Labs  Lab 06/19/20 2024 06/21/20 2004 06/21/20 2300  WBC 10.8* 30.6*  --   HGB 12.6* 13.4 15.6  HCT 39.9 45.3 46.0  MCV 93.7 98.9  --   PLT 294 473*  --    Basic Metabolic Panel: Recent Labs  Lab 06/19/20 2024 06/19/20 2024 06/21/20 2004 06/21/20 2250 06/21/20 2300  06/22/20 0423 06/22/20 1049  NA 125*   < > 126* 121* 118* 133* 139  K 5.9*   < > 6.5* >7.5* 8.0* 6.0* 4.1  CL 92*  --  83* 76*  --  91* 100  CO2 19*  --  8* 7*  --  11* 19*  GLUCOSE 1,141*  --  1,178* 1,353*  --  729* 275*  BUN 17  --  38* 38*  --  37* 33*  CREATININE 1.33*  --  2.82* 3.55*  --  3.17* 2.18*  CALCIUM 8.8*  --  9.2 8.5*  --  9.3 9.0   < > = values in this interval not displayed.   GFR: Estimated Creatinine Clearance: 62.7 mL/min (A) (by C-G formula based on SCr of 2.18 mg/dL (H)). Liver Function Tests: No results for input(s): AST, ALT, ALKPHOS, BILITOT, PROT, ALBUMIN in the last 168 hours. No results for input(s): LIPASE, AMYLASE in the last 168 hours. No results for input(s): AMMONIA in the last 168 hours. Coagulation Profile: Recent Labs  Lab 06/20/20 2212  INR 1.2   Cardiac Enzymes: No results for input(s): CKTOTAL, CKMB, CKMBINDEX, TROPONINI in the last 168 hours. BNP (last 3 results) No results for input(s): PROBNP in the last 8760 hours. HbA1C: No results for input(s): HGBA1C in the last 72 hours. CBG: Recent Labs  Lab 06/22/20 0438 06/22/20 0539 06/22/20 0700 06/22/20 0911 06/22/20 1041  GLUCAP 542* 465* 305* 202* 277*   Lipid Profile: No results for input(s): CHOL, HDL, LDLCALC, TRIG, CHOLHDL, LDLDIRECT in the last 72 hours. Thyroid Function Tests: No results for input(s): TSH, T4TOTAL, FREET4, T3FREE, THYROIDAB in the last 72 hours. Anemia Panel: No results for input(s): VITAMINB12, FOLATE, FERRITIN, TIBC, IRON, RETICCTPCT in the last 72 hours. Urine analysis:    Component Value Date/Time   COLORURINE YELLOW 06/21/2020 2308   APPEARANCEUR CLEAR 06/21/2020 2308   APPEARANCEUR Clear 01/20/2015 2136   LABSPEC 1.018 06/21/2020 2308   LABSPEC 1.023 01/20/2015 2136   PHURINE 5.0 06/21/2020 2308   GLUCOSEU >=500 (A) 06/21/2020 2308   GLUCOSEU >=500 01/20/2015 2136   HGBUR SMALL (A) 06/21/2020 2308   BILIRUBINUR NEGATIVE 06/21/2020 2308    BILIRUBINUR Negative 01/20/2015 2136   KETONESUR 20 (A) 06/21/2020 2308   PROTEINUR NEGATIVE 06/21/2020 2308   NITRITE NEGATIVE 06/21/2020 2308   LEUKOCYTESUR NEGATIVE 06/21/2020 2308   LEUKOCYTESUR Negative 01/20/2015 2136   Sepsis Labs: @LABRCNTIP (procalcitonin:4,lacticidven:4)  ) Recent Results (from the past 240 hour(s))  Culture, blood (routine x 2)     Status: None (Preliminary result)   Collection Time: 06/21/20 10:13 PM   Specimen: BLOOD  Result Value Ref Range Status   Specimen Description BLOOD SITE NOT SPECIFIED  Final   Special Requests   Final    BOTTLES DRAWN AEROBIC AND ANAEROBIC Blood Culture results may not be optimal due to an inadequate volume of blood received in culture bottles   Culture   Final    NO GROWTH < 12 HOURS Performed at [facility], [address], [place], [place] [address]    Report Status PENDING  Incomplete  Culture, blood (routine x 2)     Status: None (Preliminary result)   Collection Time: 06/21/20 10:50 PM   Specimen: BLOOD RIGHT FOREARM  Result Value Ref Range Status   Specimen Description BLOOD RIGHT FOREARM  Final   Special Requests   Final    BOTTLES DRAWN AEROBIC AND ANAEROBIC Blood Culture adequate volume   Culture   Final    NO GROWTH < 12 HOURS Performed at [facility], [address] [place], [place] [address]    Report Status PENDING  Incomplete  SARS Coronavirus 2 by RT PCR (hospital order, performed in [facility] hospital lab) Nasopharyngeal Nasopharyngeal Swab     Status: None   Collection Time: 06/21/20 11:08 PM   Specimen: Nasopharyngeal Swab  Result Value Ref Range Status   SARS Coronavirus 2 NEGATIVE NEGATIVE Final    Comment: (NOTE) SARS-CoV-2 target nucleic acids are NOT DETECTED.  The SARS-CoV-2 RNA is generally detectable in upper and lower respiratory specimens during the acute phase of infection. The lowest concentration of SARS-CoV-2 viral copies this assay can detect is 250 copies / mL. A  negative result does not preclude SARS-CoV-2 infection and should not be used as the sole basis for treatment or other patient management decisions.  A negative result may occur with improper specimen collection / handling, submission of specimen other than nasopharyngeal swab, presence of viral mutation(s) within the areas targeted by this assay, and inadequate number of viral copies (<250 copies / mL). A negative result must be combined with clinical observations, patient history, and epidemiological information.  Fact Sheet for Patients:   BoilerBrush.com.cy  Fact Sheet for Healthcare Providers: https://pope.com/  This test is not yet approved or  cleared by the [place] FDA and has been authorized for detection and/or diagnosis of SARS-CoV-2 by FDA under an Emergency Use Authorization (EUA).  This EUA will remain in effect (meaning this test can be used) for the duration of the COVID-19 declaration under Section 564(b)(1) of the Act, 21 U.S.C. section 360bbb-3(b)(1), unless the authorization is terminated or revoked sooner.  Performed at [facility], [address], [place], [place] [address]       Studies: DG CHEST PORT 1 VIEW  Result Date: 06/22/2020 CLINICAL DATA:  Hyperglycemia EXAM: PORTABLE CHEST 1 VIEW COMPARISON:  04/30/2020 FINDINGS: Single frontal view of the chest demonstrates an unremarkable cardiac silhouette. No airspace disease, effusion, or pneumothorax. No acute bony abnormality. IMPRESSION: 1. No acute intrathoracic process. Electronically Signed   By: Sharlet Salina M.D.   On: 06/22/2020 01:05    Scheduled Meds: . atorvastatin  20 mg Oral Daily  . escitalopram  20 mg Oral Daily  . heparin  5,000 Units Subcutaneous Q8H  . [START ON 06/25/2020] levothyroxine  50 mcg Intravenous Daily  . QUEtiapine  300 mg Oral QHS    Continuous Infusions: . aztreonam Stopped (06/22/20 1050)  . dextrose 5%  lactated ringers 125 mL/hr at 06/22/20 0918  . insulin 3.8 Units/hr (06/22/20 1051)  . lactated ringers Stopped (06/22/20 0917)  . valproate sodium    . [  START ON 06/23/2020] vancomycin       LOS: 1 day     Briant Cedar, MD [facility]  If 7PM-7AM, please contact night-coverage [email or web address] 06/22/2020, 12:28 PM

## 2020-06-22 NOTE — ED Notes (Signed)
Pt's CBG result was 202. Informed Chelsea - RN.

## 2020-06-22 NOTE — H&P (Signed)
History and Physical    Mark [name] TIR:[id number] DOB: 1986-04-19 DOA: 06/21/2020  PCP: Patient, No Pcp Per  Patient coming from: Homeless  I have personally briefly reviewed patient's old medical records in [place]  Chief Complaint: Hyperglycemia  HPI: Mark [name] is a 34 y.o. male with medical history significant of DM1, homeless, frequent admits for DKA.  Last in ED 2 days ago.  He presents to ED with dizziness and vomiting and c/o high BGLs.  He has not taken insulin for 5 days due to no funds to afford it.   ED Course: Pt with DKA and HHS: BGL 1178, osm 371, BHB > 8.  AG 35, pH 7.0  Also has lactate 6.4, WBC 30k.  AKI with creat 2.8.   Review of Systems: As per HPI, otherwise all review of systems negative.  Past Medical History:  Diagnosis Date  . Anxiety   . Bipolar disorder ([place])   . Depression   . Diabetes mellitus without complication ([place])   . Diabetic polyneuropathy associated with type 1 diabetes mellitus ([place])   . Hydrocephalus ([place])   . Kidney stones   . Seizures ([place])     Past Surgical History:  Procedure Laterality Date  . KIDNEY STONE SURGERY       reports that he has been smoking cigarettes. He has a 20.00 pack-year smoking history. He has never used smokeless tobacco. He reports current alcohol use. He reports current drug use. Frequency: 7.00 times per week. Drug: Marijuana.  Allergies  Allergen Reactions  . Mushroom Extract Complex Hives, Itching and Nausea And Vomiting  . Penicillins Anaphylaxis, Hives and Swelling    Has patient had a PCN reaction causing immediate rash, facial/tongue/throat swelling, SOB or lightheadedness with hypotension: Yes Has patient had a PCN reaction causing severe rash involving mucus membranes or skin necrosis: No Has patient had a PCN reaction that required hospitalization: Yes Has patient had a PCN reaction occurring within the last 10 years: Yes If all of the above answers are "NO", then may  proceed with Cephalosporin use. Tolerated ceftriaxone 03/24/20  . Sulfa Antibiotics Anaphylaxis and Hives  . Clindamycin/Lincomycin Hives    Family History  Problem Relation Age of Onset  . Breast cancer Mother      Prior to Admission medications   Medication Sig Start Date End Date Taking? Authorizing Provider  insulin detemir (LEVEMIR) 100 UNIT/ML FlexPen Inject 30 Units into the skin at bedtime. Patient taking differently: Inject 35 Units into the skin in the morning, at noon, and at bedtime.  04/22/20  Yes Sheikh, Omair Latif, DO  atorvastatin (LIPITOR) 20 MG tablet Take 1 tablet (20 mg total) by mouth daily. Patient not taking: Reported on 06/20/2020 04/12/20   Bonnielee Haff, MD  blood glucose meter kit and supplies KIT Dispense based on patient and insurance preference. Use up to four times daily as directed. (FOR ICD-9 250.00, 250.01). Patient not taking: Reported on 06/20/2020 04/22/20   Raiford Noble Latif, DO  divalproex (DEPAKOTE) 250 MG DR tablet Take 3 tablets (750 mg total) by mouth every evening. For mood stabilization Patient not taking: Reported on 06/20/2020 04/12/20   Bonnielee Haff, MD  divalproex (DEPAKOTE) 500 MG DR tablet Take 1 tablet (500 mg total) by mouth daily. For mood stabilization Patient not taking: Reported on 06/20/2020 04/12/20   Bonnielee Haff, MD  escitalopram (LEXAPRO) 20 MG tablet Take 1 tablet (20 mg total) by mouth daily. For depression Patient not taking: Reported on  06/20/2020 04/12/20   Bonnielee Haff, MD  gabapentin (NEURONTIN) 100 MG capsule Take 100 mg by mouth 2 (two) times daily. Patient not taking: Reported on 06/20/2020    [provider]  Insulin Pen Needle (PEN NEEDLES) 31G X 5 MM MISC 1 Container by Does not apply route 4 (four) times daily -  with meals and at bedtime. 04/22/20   Raiford Noble Latif, DO  Insulin Regular Human (NOVOLIN R FLEXPEN) 100 UNIT/ML SOPN Inject 10 Units as directed with breakfast, with lunch, and with evening  meal. Patient not taking: Reported on 06/21/2020 04/22/20 05/22/20  Raiford Noble Latif, DO  levothyroxine (SYNTHROID) 100 MCG tablet Take 1 tablet (100 mcg total) by mouth daily before breakfast. Patient not taking: Reported on 06/20/2020 04/12/20   Bonnielee Haff, MD  QUEtiapine (SEROQUEL) 300 MG tablet Take 1 tablet (300 mg total) by mouth at bedtime. For mood control Patient not taking: Reported on 06/20/2020 04/12/20   Bonnielee Haff, MD  insulin aspart (NOVOLOG) 100 UNIT/ML injection Inject 10 Units into the skin 3 (three) times daily with meals. Patient not taking: Reported on 03/23/2020 03/13/20 04/04/20  Antonieta Pert, MD  pregabalin (LYRICA) 150 MG capsule Take 1 capsule (150 mg total) by mouth 2 (two) times daily. Patient not taking: Reported on 03/23/2020 12/31/19 04/04/20  Damita Lack, MD    Physical Exam: Vitals:   06/21/20 1745 06/21/20 2243 06/21/20 2250 06/21/20 2253  BP:  (!) 95/48 (!) 112/56   Pulse:  (!) 125    Resp:  (!) 33    Temp:    98.2 F (36.8 C)  TempSrc:    Oral  SpO2:  97%    Weight: 113.4 kg     Height: _0  (1.88 m)       Constitutional: Demanding water, strong odor of keytones. Eyes: PERRL, lids and conjunctivae normal ENMT: Mucous membranes are moist. Posterior pharynx clear of any exudate or lesions.Normal dentition.  Neck: normal, supple, no masses, no thyromegaly Respiratory: clear to auscultation bilaterally, no wheezing, no crackles. Normal respiratory effort. No accessory muscle use.  Cardiovascular: Regular rate and rhythm, no murmurs / rubs / gallops. No extremity edema. 2+ pedal pulses. No carotid bruits.  Abdomen: no tenderness, no masses palpated. No hepatosplenomegaly. Bowel sounds positive.  Musculoskeletal: no clubbing / cyanosis. No joint deformity upper and lower extremities. Good ROM, no contractures. Normal muscle tone.  Skin: no rashes, lesions, ulcers. No induration Neurologic: CN 2-12 grossly intact. Sensation intact, DTR normal.  Strength 5/5 in all 4.  Psychiatric: Normal judgment and insight. Alert and oriented x 3. Normal mood.    Labs on Admission: I have personally reviewed following labs and imaging studies  CBC: Recent Labs  Lab 06/19/20 2024 06/21/20 2004 06/21/20 2300  WBC 10.8* 30.6*  --   HGB 12.6* 13.4 15.6  HCT 39.9 45.3 46.0  MCV 93.7 98.9  --   PLT 294 473*  --    Basic Metabolic Panel: Recent Labs  Lab 06/19/20 2024 06/21/20 2004 06/21/20 2300  NA 125* 126* 118*  K 5.9* 6.5* 8.0*  CL 92* 83*  --   CO2 19* 8*  --   GLUCOSE 1,141* 1,178*  --   BUN 17 38*  --   CREATININE 1.33* 2.82*  --   CALCIUM 8.8* 9.2  --    GFR: Estimated Creatinine Clearance: 48.5 mL/min (A) (by C-G formula based on SCr of 2.82 mg/dL (H)). Liver Function Tests: No results for input(s): AST, ALT,  ALKPHOS, BILITOT, PROT, ALBUMIN in the last 168 hours. No results for input(s): LIPASE, AMYLASE in the last 168 hours. No results for input(s): AMMONIA in the last 168 hours. Coagulation Profile: No results for input(s): INR, PROTIME in the last 168 hours. Cardiac Enzymes: No results for input(s): CKTOTAL, CKMB, CKMBINDEX, TROPONINI in the last 168 hours. BNP (last 3 results) No results for input(s): PROBNP in the last 8760 hours. HbA1C: No results for input(s): HGBA1C in the last 72 hours. CBG: Recent Labs  Lab 06/20/20 0347 06/20/20 0434 06/21/20 1949 06/21/20 2248 06/22/20 0009  GLUCAP 556* 125* >600* >600* >600*   Lipid Profile: No results for input(s): CHOL, HDL, LDLCALC, TRIG, CHOLHDL, LDLDIRECT in the last 72 hours. Thyroid Function Tests: No results for input(s): TSH, T4TOTAL, FREET4, T3FREE, THYROIDAB in the last 72 hours. Anemia Panel: No results for input(s): VITAMINB12, FOLATE, FERRITIN, TIBC, IRON, RETICCTPCT in the last 72 hours. Urine analysis:    Component Value Date/Time   COLORURINE YELLOW 06/21/2020 2308   APPEARANCEUR CLEAR 06/21/2020 2308   APPEARANCEUR Clear 01/20/2015 2136     LABSPEC 1.018 06/21/2020 2308   LABSPEC 1.023 01/20/2015 2136   PHURINE 5.0 06/21/2020 2308   GLUCOSEU >=500 (A) 06/21/2020 2308   GLUCOSEU >=500 01/20/2015 2136   HGBUR SMALL (A) 06/21/2020 2308   BILIRUBINUR NEGATIVE 06/21/2020 2308   BILIRUBINUR Negative 01/20/2015 2136   KETONESUR 20 (A) 06/21/2020 2308   PROTEINUR NEGATIVE 06/21/2020 2308   NITRITE NEGATIVE 06/21/2020 2308   LEUKOCYTESUR NEGATIVE 06/21/2020 2308   LEUKOCYTESUR Negative 01/20/2015 2136    Radiological Exams on Admission: No results found.  EKG: Independently reviewed.  Assessment/Plan Principal Problem:   DKA (diabetic ketoacidoses) (HCC) Active Problems:   Bipolar I disorder, most recent episode depressed, severe without psychotic features ([place])   Homelessness   Seizure disorder ([place])   Type 1 diabetes mellitus with hyperosmolar hyperglycemic state (HHS) ([place])   AKI (acute kidney injury) ([place])   Leukocytosis    1. DKA and HHS - 1. BHB > 8, AG 35, pH 7.0, BGL 1100. 2. DKA pathway 3. Getting 3L LR bolus for this and for SIRS / ? Sepsis with lactic acidosis (see below). 4. Then LR at 125 cc/hr 5. Insulin gtt 6. BMP Q4H 2. Leukocytosis, SIRS, and lactic acidosis - 1. BCx pending 2. Doesn't usually have WBC of 30k or lactate of 6.4 with his not-infrequent admissions for DKA / HHS in past. 3. Until we can rule out sepsis, will need to empirically treat 4. IVF: 3L bolus as above 5. Serial lactates 6. Sepsis pathway 7. Empiric aztreonam and vanc 8. Check CXR 3. AKI - 1. Due to dehydration from above 2. Strict intake and output 3. Monitor serial BMPs 4. Hyperkalemia - 1. In setting of AKI and severe hyperglycemia 2. On insulin gtt, LR, expect K to improve with these 3. Tele monitor 5. Seizure disorder - 1. Pt not currently taking any of his home meds it seems 2. Will re-order home depakote 3. No seizures since arrival 6. BPD - 1. Re-order lexapro, depakote, seroquel 7. Hypothyroidism  - 1. Re-order synthroid.  DVT prophylaxis: Heparin [place] Code Status: Full Family Communication: No family in room Disposition Plan: Home after DKA resolved and lactic acidosis resolved Consults called: None Admission status: Admit to inpatient  Severity of Illness: The appropriate patient status for this patient is INPATIENT. Inpatient status is judged to be reasonable and necessary in order to provide the required intensity of service to ensure  the patient's safety. The patient's presenting symptoms, physical exam findings, and initial radiographic and laboratory data in the context of their chronic comorbidities is felt to place them at high risk for further clinical deterioration. Furthermore, it is not anticipated that the patient will be medically stable for discharge from the hospital within 2 midnights of admission. The following factors support the patient status of inpatient.   IP status for treatment of DKA.  May or may not also have sepsis.  * I certify that at the point of admission it is my clinical judgment that the patient will require inpatient hospital care spanning beyond 2 midnights from the point of admission due to high intensity of service, high risk for further deterioration and high frequency of surveillance required.*    Tavoris Brisk M. DO [facility]  How to contact the [facility] Attending or Consulting provider [place] or covering provider during after hours [place], for this patient?  1. Check the care team in [facility] and look for a) attending/consulting [facility] provider listed and b) the [facility] team listed 2. Log into [email or web address]  Amion Physician Scheduling and messaging for groups and whole hospitals  On call and physician scheduling software for group practices, residents, hospitalists and other medical providers for call, clinic, rotation and shift schedules. OnCall Enterprise is a hospital-wide system for scheduling doctors and paging doctors on call. EasyPlot is for  scientific plotting and data analysis.  [email or web address]  and use [place]'s universal password to access. If you do not have the password, please contact the hospital operator.  3. Locate the [facility] provider you are looking for under [facility] and page to a number that you can be directly reached. 4. If you still have difficulty reaching the provider, please page the [facility] (Director on Call) for the Hospitalists listed on amion for assistance.  06/22/2020, 12:16 AM

## 2020-06-23 DIAGNOSIS — E1010 Type 1 diabetes mellitus with ketoacidosis without coma: Secondary | ICD-10-CM | POA: Diagnosis not present

## 2020-06-23 DIAGNOSIS — Z590 Homelessness: Secondary | ICD-10-CM | POA: Diagnosis not present

## 2020-06-23 DIAGNOSIS — D72829 Elevated white blood cell count, unspecified: Secondary | ICD-10-CM | POA: Diagnosis not present

## 2020-06-23 DIAGNOSIS — N179 Acute kidney failure, unspecified: Secondary | ICD-10-CM | POA: Diagnosis not present

## 2020-06-23 LAB — CBC WITH DIFFERENTIAL/PLATELET
Abs Immature Granulocytes: 0.08 10*3/uL — ABNORMAL HIGH (ref 0.00–0.07)
Basophils Absolute: 0 10*3/uL (ref 0.0–0.1)
Basophils Relative: 0 %
Eosinophils Absolute: 0.1 10*3/uL (ref 0.0–0.5)
Eosinophils Relative: 0 %
HCT: 34.5 % — ABNORMAL LOW (ref 39.0–52.0)
Hemoglobin: 11.9 g/dL — ABNORMAL LOW (ref 13.0–17.0)
Immature Granulocytes: 1 %
Lymphocytes Relative: 19 %
Lymphs Abs: 3.3 10*3/uL (ref 0.7–4.0)
MCH: 30 pg (ref 26.0–34.0)
MCHC: 34.5 g/dL (ref 30.0–36.0)
MCV: 86.9 fL (ref 80.0–100.0)
Monocytes Absolute: 0.8 10*3/uL (ref 0.1–1.0)
Monocytes Relative: 4 %
Neutro Abs: 13 10*3/uL — ABNORMAL HIGH (ref 1.7–7.7)
Neutrophils Relative %: 76 %
Platelets: 303 10*3/uL (ref 150–400)
RBC: 3.97 MIL/uL — ABNORMAL LOW (ref 4.22–5.81)
RDW: 13.5 % (ref 11.5–15.5)
WBC: 17.2 10*3/uL — ABNORMAL HIGH (ref 4.0–10.5)
nRBC: 0 % (ref 0.0–0.2)

## 2020-06-23 LAB — BLOOD CULTURE ID PANEL (REFLEXED) - BCID2

## 2020-06-23 LAB — GLUCOSE, CAPILLARY
Glucose-Capillary: 152 mg/dL — ABNORMAL HIGH (ref 70–99)
Glucose-Capillary: 163 mg/dL — ABNORMAL HIGH (ref 70–99)
Glucose-Capillary: 102 mg/dL — ABNORMAL HIGH (ref 70–99)
Glucose-Capillary: 235 mg/dL — ABNORMAL HIGH (ref 70–99)

## 2020-06-23 LAB — BASIC METABOLIC PANEL
Anion gap: 11 (ref 5–15)
BUN: 16 mg/dL (ref 6–20)
CO2: 23 mmol/L (ref 22–32)
Calcium: 8.9 mg/dL (ref 8.9–10.3)
Chloride: 103 mmol/L (ref 98–111)
Creatinine, Ser: 1.21 mg/dL (ref 0.61–1.24)
GFR calc Af Amer: >60 mL/min (ref >60)
GFR calc non Af Amer: >60 mL/min (ref >60)
Glucose, Bld: 158 mg/dL — ABNORMAL HIGH (ref 70–99)
Potassium: 3.2 mmol/L — ABNORMAL LOW (ref 3.5–5.1)
Sodium: 137 mmol/L (ref 135–145)

## 2020-06-23 LAB — HEMOGLOBIN A1C
Hgb A1c MFr Bld: 8 % — ABNORMAL HIGH (ref 4.8–5.6)
Mean Plasma Glucose: 183 mg/dL

## 2020-06-23 LAB — PROCALCITONIN: Procalcitonin: 8.93 ng/mL

## 2020-06-23 LAB — BETA-HYDROXYBUTYRIC ACID: Beta-Hydroxybutyric Acid: 0.52 mmol/L — ABNORMAL HIGH (ref 0.05–0.27)

## 2020-06-23 MED ORDER — DIVALPROEX SODIUM 500 MG PO DR TAB
500.0000 mg | DELAYED_RELEASE_TABLET | Freq: Every day | ORAL | Status: DC
  Administered 2020-06-23 – 2020-06-26 (×4): 500 mg via ORAL
  Filled 2020-06-23 (×4): qty 1

## 2020-06-23 MED ORDER — POTASSIUM CHLORIDE CRYS ER 20 MEQ PO TBCR
40.0000 meq | EXTENDED_RELEASE_TABLET | Freq: Once | ORAL | Status: AC
  Administered 2020-06-23: 40 meq via ORAL
  Filled 2020-06-23: qty 2

## 2020-06-23 MED ORDER — DIVALPROEX SODIUM 500 MG PO DR TAB
750.0000 mg | DELAYED_RELEASE_TABLET | Freq: Every evening | ORAL | Status: DC
  Administered 2020-06-23 – 2020-06-25 (×3): 750 mg via ORAL
  Filled 2020-06-23 (×4): qty 1

## 2020-06-23 MED ORDER — LEVOTHYROXINE SODIUM 100 MCG PO TABS
100.0000 ug | ORAL_TABLET | Freq: Every day | ORAL | Status: DC
  Administered 2020-06-23 – 2020-06-26 (×4): 100 ug via ORAL
  Filled 2020-06-23 (×4): qty 1

## 2020-06-23 NOTE — Progress Notes (Signed)
PROGRESS NOTE  Mark [name]:[id number] DOB: 17-Mar-1986 DOA: 06/21/2020 PCP: Patient, No Pcp Per  HPI/Recap of past 24 hours: HPI from Dr Royanne Foots is a 34 y.o. male with medical history significant of DM1, homeless, frequent admits for DKA.  Last in ED 2 days ago.  He presents to ED with dizziness and vomiting and c/o high BGLs.  He has not taken insulin for 5 days due to no funds to afford it. ED Course: Pt with DKA and HHS: BGL 1178, osm 371, BHB > 8.  AG 35, pH 7.0, lactate 6.4, WBC 30k, AKI with creat 2.8.  Patient admitted for further management.    Today, patient reports generalized fatigue, denied any new complaints.    Assessment/Plan: Principal Problem:   DKA (diabetic ketoacidoses) (HCC) Active Problems:   Bipolar I disorder, most recent episode depressed, severe without psychotic features (HCC)   Homelessness   Seizure disorder (HCC)   Type 1 diabetes mellitus with hyperosmolar hyperglycemic state (HHS) (HCC)   AKI (acute kidney injury) (HCC)   Leukocytosis   DKA/type 1 diabetes mellitus On admission, glucose greater than 1000, bicarb 7, anion gap 38 Not currently taking any insulin as unable to afford it S/P insulin drip, IV fluids SSI, Lantus, NovoLog 3 times daily, Accu-Cheks, hypoglycemic protocol  Possible sepsis Leukocytosis, tachycardic, tachypneic, lactic acidosis on admission Currently afebrile, with leukocytosis ?? Possibly above signs from DKA UA with greater than 500 glucose, otherwise unremarkable BC x2, with 1 set growing gram-negative rods Procalcitonin 12.34-8.93 Chest x-ray unremarkable Continue empiric vancomycin, ceftriaxone Monitor closely  AKI Likely 2/2 DKA Daily BMP  Hypothyroidism Continue Synthroid  Seizure disorder Continue Depakote  Bipolar disorder Continue Lexapro, Seroquel once able  Obesity Lifestyle modification advised        Malnutrition Type:      Malnutrition Characteristics:       Nutrition Interventions:       Estimated body mass index is 32.1 kg/m as calculated from the following:   Height as of this encounter: 6\' 2"  (1.88 m).   Weight as of this encounter: 113.4 kg.     Code Status: Full  Family Communication: None at bedside  Disposition Plan: Status is: Inpatient  Remains inpatient appropriate because:Inpatient level of care appropriate due to severity of illness   Dispo: The patient is from: Homeless              Anticipated d/c is to: TBD              Anticipated d/c date is: 2 days              Patient currently is not medically stable to d/c.     Consultants:  None  Procedures:  None  Antimicrobials:  Ceftriaxone  Vancomycin  DVT prophylaxis: Heparin [place]   Objective: Vitals:   06/23/20 0823 06/23/20 0824 06/23/20 1307 06/23/20 1643  BP: 118/80  (!) 98/57 122/80  Pulse: 84  100 80  Resp: (!) 26 20 19  (!) 27  Temp: 98.3 F (36.8 C)  98.3 F (36.8 C) 98.5 F (36.9 C)  TempSrc: Oral  Oral Oral  SpO2: 98%  96% 97%  Weight:      Height:        Intake/Output Summary (Last 24 hours) at 06/23/2020 1646 Last data filed at 06/23/2020 1000 Gross per 24 hour  Intake 1971.35 ml  Output 770 ml  Net 1201.35 ml   Filed Weights   06/21/20 1745  Weight: 113.4 kg    Exam:  General: NAD  Cardiovascular: S1, S2 present  Respiratory: CTAB  Abdomen: Soft, nontender, nondistended, bowel sounds present  Musculoskeletal: No bilateral pedal edema noted  Skin: Normal  Psychiatry:  Normal mood    Data Reviewed: CBC: Recent Labs  Lab 06/19/20 2024 06/21/20 2004 06/21/20 2300 06/23/20 0040  WBC 10.8* 30.6*  --  17.2*  NEUTROABS  --   --   --  13.0*  HGB 12.6* 13.4 15.6 11.9*  HCT 39.9 45.3 46.0 34.5*  MCV 93.7 98.9  --  86.9  PLT 294 473*  --  303   Basic Metabolic Panel: Recent Labs  Lab 06/22/20 0423 06/22/20 1049 06/22/20 1230 06/22/20 1551 06/23/20 0040  NA 133* 139 141 139 137  K 6.0* 4.1 4.7  3.8 3.2*  CL 91* 100 103 104 103  CO2 11* 19* 21* 23 23  GLUCOSE 729* 275* 246* 210* 158*  BUN 37* 33* 30* 24* 16  CREATININE 3.17* 2.18* 2.05* 1.61* 1.21  CALCIUM 9.3 9.0 9.3 9.2 8.9   GFR: Estimated Creatinine Clearance: 113 mL/min (by C-G formula based on SCr of 1.21 mg/dL). Liver Function Tests: No results for input(s): AST, ALT, ALKPHOS, BILITOT, PROT, ALBUMIN in the last 168 hours. No results for input(s): LIPASE, AMYLASE in the last 168 hours. No results for input(s): AMMONIA in the last 168 hours. Coagulation Profile: Recent Labs  Lab 06/20/20 2212  INR 1.2   Cardiac Enzymes: No results for input(s): CKTOTAL, CKMB, CKMBINDEX, TROPONINI in the last 168 hours. BNP (last 3 results) No results for input(s): PROBNP in the last 8760 hours. HbA1C: Recent Labs    06/21/20 2350 06/22/20 1900  HGBA1C 8.0* 8.2*   CBG: Recent Labs  Lab 06/22/20 1734 06/22/20 1749 06/22/20 2131 06/23/20 0612 06/23/20 1201  GLUCAP 177* 221* 160* 152* 163*   Lipid Profile: No results for input(s): CHOL, HDL, LDLCALC, TRIG, CHOLHDL, LDLDIRECT in the last 72 hours. Thyroid Function Tests: No results for input(s): TSH, T4TOTAL, FREET4, T3FREE, THYROIDAB in the last 72 hours. Anemia Panel: No results for input(s): VITAMINB12, FOLATE, FERRITIN, TIBC, IRON, RETICCTPCT in the last 72 hours. Urine analysis:    Component Value Date/Time   COLORURINE YELLOW 06/21/2020 2308   APPEARANCEUR CLEAR 06/21/2020 2308   APPEARANCEUR Clear 01/20/2015 2136   LABSPEC 1.018 06/21/2020 2308   LABSPEC 1.023 01/20/2015 2136   PHURINE 5.0 06/21/2020 2308   GLUCOSEU >=500 (A) 06/21/2020 2308   GLUCOSEU >=500 01/20/2015 2136   HGBUR SMALL (A) 06/21/2020 2308   BILIRUBINUR NEGATIVE 06/21/2020 2308   BILIRUBINUR Negative 01/20/2015 2136   KETONESUR 20 (A) 06/21/2020 2308   PROTEINUR NEGATIVE 06/21/2020 2308   NITRITE NEGATIVE 06/21/2020 2308   LEUKOCYTESUR NEGATIVE 06/21/2020 2308   LEUKOCYTESUR Negative  01/20/2015 2136   Sepsis Labs: @LABRCNTIP (procalcitonin:4,lacticidven:4)  ) Recent Results (from the past 240 hour(s))  Culture, blood (routine x 2)     Status: None (Preliminary result)   Collection Time: 06/21/20 10:13 PM   Specimen: BLOOD  Result Value Ref Range Status   Specimen Description BLOOD SITE NOT SPECIFIED  Final   Special Requests   Final    BOTTLES DRAWN AEROBIC AND ANAEROBIC Blood Culture results may not be optimal due to an inadequate volume of blood received in culture bottles   Culture   Final    NO GROWTH 2 DAYS Performed at [facility], [address] [place], [place]    Report Status PENDING  Incomplete  Culture, blood (routine x 2)     Status: None (Preliminary result)   Collection Time: 06/21/20 10:50 PM   Specimen: BLOOD RIGHT FOREARM  Result Value Ref Range Status   Specimen Description BLOOD RIGHT FOREARM  Final   Special Requests   Final    BOTTLES DRAWN AEROBIC AND ANAEROBIC Blood Culture adequate volume   Culture  Setup Time   Final    AEROBIC BOTTLE ONLY GRAM NEGATIVE RODS Organism ID to follow CRITICAL RESULT CALLED TO, READ BACK BY AND VERIFIED WITH: Arnaldo Natal [facility] 06/23/20 0254 JDW    Culture   Final    NO GROWTH 2 DAYS Performed at [facility], [address], [place], [place] [address]    Report Status PENDING  Incomplete  Blood Culture ID Panel (Reflexed)     Status: None   Collection Time: 06/21/20 10:50 PM  Result Value Ref Range Status   Enterococcus faecalis NOT DETECTED NOT DETECTED Final   Enterococcus Faecium NOT DETECTED NOT DETECTED Final   Listeria monocytogenes NOT DETECTED NOT DETECTED Final   Staphylococcus species NOT DETECTED NOT DETECTED Final   Staphylococcus aureus (BCID) NOT DETECTED NOT DETECTED Final   Staphylococcus epidermidis NOT DETECTED NOT DETECTED Final   Staphylococcus lugdunensis NOT DETECTED NOT DETECTED Final   Streptococcus species NOT DETECTED NOT DETECTED Final    Streptococcus agalactiae NOT DETECTED NOT DETECTED Final   Streptococcus pneumoniae NOT DETECTED NOT DETECTED Final   Streptococcus pyogenes NOT DETECTED NOT DETECTED Final   A.calcoaceticus-baumannii NOT DETECTED NOT DETECTED Final   Bacteroides fragilis NOT DETECTED NOT DETECTED Final   Enterobacterales NOT DETECTED NOT DETECTED Final   Enterobacter cloacae complex NOT DETECTED NOT DETECTED Final   Escherichia coli NOT DETECTED NOT DETECTED Final   Klebsiella aerogenes NOT DETECTED NOT DETECTED Final   Klebsiella oxytoca NOT DETECTED NOT DETECTED Final   Klebsiella pneumoniae NOT DETECTED NOT DETECTED Final   Proteus species NOT DETECTED NOT DETECTED Final   Salmonella species NOT DETECTED NOT DETECTED Final   Serratia marcescens NOT DETECTED NOT DETECTED Final   Haemophilus influenzae NOT DETECTED NOT DETECTED Final   Neisseria meningitidis NOT DETECTED NOT DETECTED Final   Pseudomonas aeruginosa NOT DETECTED NOT DETECTED Final   Stenotrophomonas maltophilia NOT DETECTED NOT DETECTED Final   Candida albicans NOT DETECTED NOT DETECTED Final   Candida auris NOT DETECTED NOT DETECTED Final   Candida glabrata NOT DETECTED NOT DETECTED Final   Candida krusei NOT DETECTED NOT DETECTED Final   Candida parapsilosis NOT DETECTED NOT DETECTED Final   Candida tropicalis NOT DETECTED NOT DETECTED Final   Cryptococcus neoformans/gattii NOT DETECTED NOT DETECTED Final    Comment: Performed at [facility], [address], [place], [place] [address]  SARS Coronavirus 2 by RT PCR (hospital order, performed in [facility] hospital lab) Nasopharyngeal Nasopharyngeal Swab     Status: None   Collection Time: 06/21/20 11:08 PM   Specimen: Nasopharyngeal Swab  Result Value Ref Range Status   SARS Coronavirus 2 NEGATIVE NEGATIVE Final    Comment: (NOTE) SARS-CoV-2 target nucleic acids are NOT DETECTED.  The SARS-CoV-2 RNA is generally detectable in upper and lower respiratory specimens during  the acute phase of infection. The lowest concentration of SARS-CoV-2 viral copies this assay can detect is 250 copies / mL. A negative result does not preclude SARS-CoV-2 infection and should not be used as the sole basis for treatment or other patient management decisions.  A negative  result may occur with improper specimen collection / handling, submission of specimen other than nasopharyngeal swab, presence of viral mutation(s) within the areas targeted by this assay, and inadequate number of viral copies (<250 copies / mL). A negative result must be combined with clinical observations, patient history, and epidemiological information.  Fact Sheet for Patients:   BoilerBrush.com.cyhttps://www.fda.gov/media/136312/download  Fact Sheet for Healthcare Providers: https://pope.com/https://www.fda.gov/media/136313/download  This test is not yet approved or  cleared by the [place] FDA and has been authorized for detection and/or diagnosis of SARS-CoV-2 by FDA under an Emergency Use Authorization (EUA).  This EUA will remain in effect (meaning this test can be used) for the duration of the COVID-19 declaration under Section 564(b)(1) of the Act, 21 U.S.C. section 360bbb-3(b)(1), unless the authorization is terminated or revoked sooner.  Performed at [facility], [address] [place], [place] [address]       Studies: No results found.  Scheduled Meds: . atorvastatin  20 mg Oral Daily  . divalproex  500 mg Oral Daily  . divalproex  750 mg Oral QPM  . escitalopram  20 mg Oral Daily  . heparin  5,000 Units Subcutaneous Q8H  . insulin aspart  0-5 Units Subcutaneous QHS  . insulin aspart  0-9 Units Subcutaneous TID WC  . insulin aspart  4 Units Subcutaneous TID WC  . insulin glargine  15 Units Subcutaneous BID  . levothyroxine  100 mcg Oral QAC breakfast  . QUEtiapine  300 mg Oral QHS    Continuous Infusions: . cefTRIAXone (ROCEPHIN)  IV 2 g (06/23/20 0102)  . vancomycin 1,250 mg (06/23/20 0552)       LOS: 2 days     Briant CedarNkeiruka J Amada Hallisey, MD [facility]  If 7PM-7AM, please contact night-coverage [email or web address] 06/23/2020, 4:46 PM

## 2020-06-23 NOTE — Progress Notes (Addendum)
PHARMACY - PHYSICIAN COMMUNICATION CRITICAL VALUE ALERT - BLOOD CULTURE IDENTIFICATION (BCID)  Mark [name] is an 34 y.o. male who presented to [facility] on 06/21/2020 with a chief complaint of hyperglycemia  Assessment: 1 or 4 blood cultures with GNR - BCID unable to identify.  Name of physician (or Provider) Contacted: Kirtland Bouchard Black  Current antibiotics: ceftriaxone  Changes to prescribed antibiotics recommended:  None   Results for orders placed or performed during the hospital encounter of 06/21/20  Blood Culture ID Panel (Reflexed) (Collected: 06/21/2020 10:50 PM)  Result Value Ref Range   Enterococcus faecalis NOT DETECTED NOT DETECTED   Enterococcus Faecium NOT DETECTED NOT DETECTED   Listeria monocytogenes NOT DETECTED NOT DETECTED   Staphylococcus species NOT DETECTED NOT DETECTED   Staphylococcus aureus (BCID) NOT DETECTED NOT DETECTED   Staphylococcus epidermidis NOT DETECTED NOT DETECTED   Staphylococcus lugdunensis NOT DETECTED NOT DETECTED   Streptococcus species NOT DETECTED NOT DETECTED   Streptococcus agalactiae NOT DETECTED NOT DETECTED   Streptococcus pneumoniae NOT DETECTED NOT DETECTED   Streptococcus pyogenes NOT DETECTED NOT DETECTED   A.calcoaceticus-baumannii NOT DETECTED NOT DETECTED   Bacteroides fragilis NOT DETECTED NOT DETECTED   Enterobacterales NOT DETECTED NOT DETECTED   Enterobacter cloacae complex NOT DETECTED NOT DETECTED   Escherichia coli NOT DETECTED NOT DETECTED   Klebsiella aerogenes NOT DETECTED NOT DETECTED   Klebsiella oxytoca NOT DETECTED NOT DETECTED   Klebsiella pneumoniae NOT DETECTED NOT DETECTED   Proteus species NOT DETECTED NOT DETECTED   Salmonella species NOT DETECTED NOT DETECTED   Serratia marcescens NOT DETECTED NOT DETECTED   Haemophilus influenzae NOT DETECTED NOT DETECTED   Neisseria meningitidis NOT DETECTED NOT DETECTED   Pseudomonas aeruginosa NOT DETECTED NOT DETECTED   Stenotrophomonas maltophilia NOT DETECTED  NOT DETECTED   Candida albicans NOT DETECTED NOT DETECTED   Candida auris NOT DETECTED NOT DETECTED   Candida glabrata NOT DETECTED NOT DETECTED   Candida krusei NOT DETECTED NOT DETECTED   Candida parapsilosis NOT DETECTED NOT DETECTED   Candida tropicalis NOT DETECTED NOT DETECTED   Cryptococcus neoformans/gattii NOT DETECTED NOT DETECTED    Gardner Candle 06/23/2020  6:47 AM

## 2020-06-24 DIAGNOSIS — Z590 Homelessness: Secondary | ICD-10-CM | POA: Diagnosis not present

## 2020-06-24 DIAGNOSIS — N179 Acute kidney failure, unspecified: Secondary | ICD-10-CM | POA: Diagnosis not present

## 2020-06-24 DIAGNOSIS — E1010 Type 1 diabetes mellitus with ketoacidosis without coma: Secondary | ICD-10-CM | POA: Diagnosis not present

## 2020-06-24 DIAGNOSIS — D72829 Elevated white blood cell count, unspecified: Secondary | ICD-10-CM | POA: Diagnosis not present

## 2020-06-24 LAB — BASIC METABOLIC PANEL
Anion gap: 14 (ref 5–15)
BUN: 8 mg/dL (ref 6–20)
CO2: 20 mmol/L — ABNORMAL LOW (ref 22–32)
Calcium: 9.2 mg/dL (ref 8.9–10.3)
Chloride: 106 mmol/L (ref 98–111)
Creatinine, Ser: 1.03 mg/dL (ref 0.61–1.24)
GFR calc Af Amer: >60 mL/min (ref >60)
GFR calc non Af Amer: >60 mL/min (ref >60)
Glucose, Bld: 250 mg/dL — ABNORMAL HIGH (ref 70–99)
Potassium: 4.8 mmol/L (ref 3.5–5.1)
Sodium: 140 mmol/L (ref 135–145)

## 2020-06-24 LAB — CBC WITH DIFFERENTIAL/PLATELET
Abs Immature Granulocytes: 0.03 10*3/uL (ref 0.00–0.07)
Basophils Absolute: 0 10*3/uL (ref 0.0–0.1)
Basophils Relative: 0 %
Eosinophils Absolute: 0.1 10*3/uL (ref 0.0–0.5)
Eosinophils Relative: 1 %
HCT: 41.5 % (ref 39.0–52.0)
Hemoglobin: 13.2 g/dL (ref 13.0–17.0)
Immature Granulocytes: 0 %
Lymphocytes Relative: 29 %
Lymphs Abs: 2.1 10*3/uL (ref 0.7–4.0)
MCH: 29.3 pg (ref 26.0–34.0)
MCHC: 31.8 g/dL (ref 30.0–36.0)
MCV: 92.2 fL (ref 80.0–100.0)
Monocytes Absolute: 0.5 10*3/uL (ref 0.1–1.0)
Monocytes Relative: 7 %
Neutro Abs: 4.4 10*3/uL (ref 1.7–7.7)
Neutrophils Relative %: 63 %
Platelets: 230 10*3/uL (ref 150–400)
RBC: 4.5 MIL/uL (ref 4.22–5.81)
RDW: 14 % (ref 11.5–15.5)
WBC: 7.1 10*3/uL (ref 4.0–10.5)
nRBC: 0 % (ref 0.0–0.2)

## 2020-06-24 LAB — GLUCOSE, CAPILLARY
Glucose-Capillary: 313 mg/dL — ABNORMAL HIGH (ref 70–99)
Glucose-Capillary: 187 mg/dL — ABNORMAL HIGH (ref 70–99)
Glucose-Capillary: 85 mg/dL (ref 70–99)
Glucose-Capillary: 257 mg/dL — ABNORMAL HIGH (ref 70–99)

## 2020-06-24 LAB — PROCALCITONIN: Procalcitonin: 2.57 ng/mL

## 2020-06-24 MED ORDER — GABAPENTIN 100 MG PO CAPS
100.0000 mg | ORAL_CAPSULE | Freq: Two times a day (BID) | ORAL | Status: DC
  Administered 2020-06-24 – 2020-06-26 (×5): 100 mg via ORAL
  Filled 2020-06-24 (×5): qty 1

## 2020-06-24 MED ORDER — INSULIN GLARGINE 100 UNIT/ML ~~LOC~~ SOLN
20.0000 [IU] | Freq: Two times a day (BID) | SUBCUTANEOUS | Status: DC
  Administered 2020-06-24 – 2020-06-26 (×5): 20 [IU] via SUBCUTANEOUS
  Filled 2020-06-24 (×7): qty 0.2

## 2020-06-24 NOTE — TOC Transition Note (Signed)
Transition of Care (TOC) - CM/SW Discharge Note Donn Pierini RN, BSN Transitions of Care Unit 4E- RN Case Manager See Treatment Team for direct phone #    Patient Details  Name: Mark [name] MRN: [id number] Date of Birth: 1986/04/04  Transition of Care [facility]) CM/SW Contact:  Darrold Span, RN Phone Number: 06/24/2020, 12:28 PM   Clinical Narrative:    Pt has been set up with [facility] on several admission and has not followed through on any of the appointments. Per previous TOC notes pt has also been provided with [facility] resources.  He also has been assisted with medications 3x in the last 12 months with [facility]- this program is for medication assistance once in a 12 mo. Rolling period- pt has surpassed this with no effort to establish with a clinic for ongoing medication assistance.  Pt needs to be willing to f/u with clinic if they are willing to make another appointment for him. TOC will speak to pt prior to discharge.      Barriers to Discharge: Continued Medical Work up   Patient Goals and CMS Choice        Discharge Placement                       Discharge Plan and Services   Discharge Planning Services: CM Consult, Medication Assistance, Indigent Health Clinic                                 Social Determinants of Health (SDOH) Interventions     Readmission Risk Interventions Readmission Risk Prevention Plan 03/25/2020  Transportation Screening Complete  Medication Review (RN Care Manager) Complete  PCP or Specialist appointment within 3-5 days of discharge Complete  HRI or Home Care Consult (No Data)  SW Recovery Care/Counseling Consult Complete  Palliative Care Screening Not Applicable  Skilled Nursing Facility Not Applicable  Some recent data might be hidden

## 2020-06-24 NOTE — Progress Notes (Signed)
PROGRESS NOTE  Mark [name] NWG:[id number] DOB: Mar 12, 1986 DOA: 06/21/2020 PCP: Patient, No Pcp Per  HPI/Recap of past 24 hours: HPI from Dr Royanne Foots is a 34 y.o. male with medical history significant of DM1, homeless, frequent admits for DKA.  Last in ED 2 days ago.  He presents to ED with dizziness and vomiting and c/o high BGLs.  He has not taken insulin for 5 days due to no funds to afford it. ED Course: Pt with DKA and HHS: BGL 1178, osm 371, BHB > 8.  AG 35, pH 7.0, lactate 6.4, WBC 30k, AKI with creat 2.8.  Patient admitted for further management.    Today, patient complains of bilateral lower extremity pain/burning.  Repeat blood culture pending    Assessment/Plan: Principal Problem:   DKA (diabetic ketoacidoses) (HCC) Active Problems:   Bipolar I disorder, most recent episode depressed, severe without psychotic features (HCC)   Homelessness   Seizure disorder (HCC)   Type 1 diabetes mellitus with hyperosmolar hyperglycemic state (HHS) (HCC)   AKI (acute kidney injury) (HCC)   Leukocytosis   DKA/type 1 diabetes mellitus Diabetic neuropathy On admission, glucose greater than 1000, bicarb 7, anion gap 38 Not currently taking any insulin as unable to afford it S/P insulin drip, IV fluids SSI, Lantus, NovoLog 3 times daily, Accu-Cheks, hypoglycemic protocol Restart home gabapentin for neuropathy  Possible sepsis ?gram neg rods bacteremia  Leukocytosis, tachycardic, tachypneic, lactic acidosis on admission Currently afebrile, with resolved leukocytosis ?? Possibly above signs from DKA UA with greater than 500 glucose, otherwise unremarkable BC x2, with 1 set growing gram-negative rods, repeat pending Procalcitonin 12.34-8.93-->2.57 Chest x-ray unremarkable Continue empiric ceftriaxone, d/c vanc Monitor closely  AKI Likely 2/2 DKA Daily BMP  Hypothyroidism Continue Synthroid  Seizure disorder Continue Depakote  Bipolar disorder Continue  Lexapro, Seroquel once able  Obesity Lifestyle modification advised        Malnutrition Type:      Malnutrition Characteristics:      Nutrition Interventions:       Estimated body mass index is 32.1 kg/m as calculated from the following:   Height as of this encounter: 6\' 2"  (1.88 m).   Weight as of this encounter: 113.4 kg.     Code Status: Full  Family Communication: None at bedside  Disposition Plan: Status is: Inpatient  Remains inpatient appropriate because:Inpatient level of care appropriate due to severity of illness   Dispo: The patient is from: Homeless              Anticipated d/c is to: TBD              Anticipated d/c date is: 1 day              Patient currently is not medically stable to d/c.     Consultants:  None  Procedures:  None  Antimicrobials:  Ceftriaxone  DVT prophylaxis: Heparin Hardy   Objective: Vitals:   06/23/20 2332 06/24/20 0431 06/24/20 0748 06/24/20 1133  BP: 103/67 102/66 108/77 (!) 124/98  Pulse: 91 75 79 84  Resp: 18 18 14 20   Temp: 99.1 F (37.3 C) 98.1 F (36.7 C) 98.1 F (36.7 C) 98 F (36.7 C)  TempSrc: Oral Oral Oral Oral  SpO2: 97% 98% 99% 98%  Weight:      Height:        Intake/Output Summary (Last 24 hours) at 06/24/2020 1530 Last data filed at 06/24/2020 0845 Gross per 24 hour  Intake 950 ml  Output 1500 ml  Net -550 ml   Filed Weights   06/21/20 1745  Weight: 113.4 kg    Exam:  General: NAD  Cardiovascular: S1, S2 present  Respiratory: CTAB  Abdomen: Soft, nontender, nondistended, bowel sounds present  Musculoskeletal: No bilateral pedal edema noted  Skin: Normal  Psychiatry:  Normal mood    Data Reviewed: CBC: Recent Labs  Lab 06/19/20 2024 06/21/20 2004 06/21/20 2300 06/23/20 0040 06/24/20 0951  WBC 10.8* 30.6*  --  17.2* 7.1  NEUTROABS  --   --   --  13.0* 4.4  HGB 12.6* 13.4 15.6 11.9* 13.2  HCT 39.9 45.3 46.0 34.5* 41.5  MCV 93.7 98.9  --  86.9 92.2    PLT 294 473*  --  303 230   Basic Metabolic Panel: Recent Labs  Lab 06/22/20 1049 06/22/20 1230 06/22/20 1551 06/23/20 0040 06/24/20 0951  NA 139 141 139 137 140  K 4.1 4.7 3.8 3.2* 4.8  CL 100 103 104 103 106  CO2 19* 21* 23 23 20*  GLUCOSE 275* 246* 210* 158* 250*  BUN 33* 30* 24* 16 8  CREATININE 2.18* 2.05* 1.61* 1.21 1.03  CALCIUM 9.0 9.3 9.2 8.9 9.2   GFR: Estimated Creatinine Clearance: 132.8 mL/min (by C-G formula based on SCr of 1.03 mg/dL). Liver Function Tests: No results for input(s): AST, ALT, ALKPHOS, BILITOT, PROT, ALBUMIN in the last 168 hours. No results for input(s): LIPASE, AMYLASE in the last 168 hours. No results for input(s): AMMONIA in the last 168 hours. Coagulation Profile: Recent Labs  Lab 06/20/20 2212  INR 1.2   Cardiac Enzymes: No results for input(s): CKTOTAL, CKMB, CKMBINDEX, TROPONINI in the last 168 hours. BNP (last 3 results) No results for input(s): PROBNP in the last 8760 hours. HbA1C: Recent Labs    06/21/20 2350 06/22/20 1900  HGBA1C 8.0* 8.2*   CBG: Recent Labs  Lab 06/23/20 1201 06/23/20 1641 06/23/20 2104 06/24/20 0608 06/24/20 1133  GLUCAP 163* 102* 235* 313* 187*   Lipid Profile: No results for input(s): CHOL, HDL, LDLCALC, TRIG, CHOLHDL, LDLDIRECT in the last 72 hours. Thyroid Function Tests: No results for input(s): TSH, T4TOTAL, FREET4, T3FREE, THYROIDAB in the last 72 hours. Anemia Panel: No results for input(s): VITAMINB12, FOLATE, FERRITIN, TIBC, IRON, RETICCTPCT in the last 72 hours. Urine analysis:    Component Value Date/Time   COLORURINE YELLOW 06/21/2020 2308   APPEARANCEUR CLEAR 06/21/2020 2308   APPEARANCEUR Clear 01/20/2015 2136   LABSPEC 1.018 06/21/2020 2308   LABSPEC 1.023 01/20/2015 2136   PHURINE 5.0 06/21/2020 2308   GLUCOSEU >=500 (A) 06/21/2020 2308   GLUCOSEU >=500 01/20/2015 2136   HGBUR SMALL (A) 06/21/2020 2308   BILIRUBINUR NEGATIVE 06/21/2020 2308   BILIRUBINUR Negative  01/20/2015 2136   KETONESUR 20 (A) 06/21/2020 2308   PROTEINUR NEGATIVE 06/21/2020 2308   NITRITE NEGATIVE 06/21/2020 2308   LEUKOCYTESUR NEGATIVE 06/21/2020 2308   LEUKOCYTESUR Negative 01/20/2015 2136   Sepsis Labs: @LABRCNTIP (procalcitonin:4,lacticidven:4)  ) Recent Results (from the past 240 hour(s))  Culture, blood (routine x 2)     Status: None (Preliminary result)   Collection Time: 06/21/20 10:13 PM   Specimen: BLOOD  Result Value Ref Range Status   Specimen Description BLOOD SITE NOT SPECIFIED  Final   Special Requests   Final    BOTTLES DRAWN AEROBIC AND ANAEROBIC Blood Culture results may not be optimal due to an inadequate volume of blood received in culture bottles   Culture  Final    NO GROWTH 3 DAYS Performed at [facility], [address] [place], [place] [address]    Report Status PENDING  Incomplete  Culture, blood (routine x 2)     Status: None (Preliminary result)   Collection Time: 06/21/20 10:50 PM   Specimen: BLOOD RIGHT FOREARM  Result Value Ref Range Status   Specimen Description BLOOD RIGHT FOREARM  Final   Special Requests   Final    BOTTLES DRAWN AEROBIC AND ANAEROBIC Blood Culture adequate volume   Culture  Setup Time   Final    AEROBIC BOTTLE ONLY GRAM NEGATIVE RODS CRITICAL RESULT CALLED TO, READ BACK BY AND VERIFIED WITH: Arnaldo Natal [facility] 06/23/20 9562 JDW    Culture   Final    GRAM NEGATIVE RODS IDENTIFICATION TO FOLLOW Performed at [facility], [address], [place], [place] [address]    Report Status PENDING  Incomplete  Blood Culture ID Panel (Reflexed)     Status: None   Collection Time: 06/21/20 10:50 PM  Result Value Ref Range Status   Enterococcus faecalis NOT DETECTED NOT DETECTED Final   Enterococcus Faecium NOT DETECTED NOT DETECTED Final   Listeria monocytogenes NOT DETECTED NOT DETECTED Final   Staphylococcus species NOT DETECTED NOT DETECTED Final   Staphylococcus aureus (BCID) NOT DETECTED NOT DETECTED Final    Staphylococcus epidermidis NOT DETECTED NOT DETECTED Final   Staphylococcus lugdunensis NOT DETECTED NOT DETECTED Final   Streptococcus species NOT DETECTED NOT DETECTED Final   Streptococcus agalactiae NOT DETECTED NOT DETECTED Final   Streptococcus pneumoniae NOT DETECTED NOT DETECTED Final   Streptococcus pyogenes NOT DETECTED NOT DETECTED Final   A.calcoaceticus-baumannii NOT DETECTED NOT DETECTED Final   Bacteroides fragilis NOT DETECTED NOT DETECTED Final   Enterobacterales NOT DETECTED NOT DETECTED Final   Enterobacter cloacae complex NOT DETECTED NOT DETECTED Final   Escherichia coli NOT DETECTED NOT DETECTED Final   Klebsiella aerogenes NOT DETECTED NOT DETECTED Final   Klebsiella oxytoca NOT DETECTED NOT DETECTED Final   Klebsiella pneumoniae NOT DETECTED NOT DETECTED Final   Proteus species NOT DETECTED NOT DETECTED Final   Salmonella species NOT DETECTED NOT DETECTED Final   Serratia marcescens NOT DETECTED NOT DETECTED Final   Haemophilus influenzae NOT DETECTED NOT DETECTED Final   Neisseria meningitidis NOT DETECTED NOT DETECTED Final   Pseudomonas aeruginosa NOT DETECTED NOT DETECTED Final   Stenotrophomonas maltophilia NOT DETECTED NOT DETECTED Final   Candida albicans NOT DETECTED NOT DETECTED Final   Candida auris NOT DETECTED NOT DETECTED Final   Candida glabrata NOT DETECTED NOT DETECTED Final   Candida krusei NOT DETECTED NOT DETECTED Final   Candida parapsilosis NOT DETECTED NOT DETECTED Final   Candida tropicalis NOT DETECTED NOT DETECTED Final   Cryptococcus neoformans/gattii NOT DETECTED NOT DETECTED Final    Comment: Performed at [facility], [address] [place], [place] [address]  SARS Coronavirus 2 by RT PCR (hospital order, performed in [facility] hospital lab) Nasopharyngeal Nasopharyngeal Swab     Status: None   Collection Time: 06/21/20 11:08 PM   Specimen: Nasopharyngeal Swab  Result Value Ref Range Status   SARS Coronavirus 2 NEGATIVE  NEGATIVE Final    Comment: (NOTE) SARS-CoV-2 target nucleic acids are NOT DETECTED.  The SARS-CoV-2 RNA is generally detectable in upper and lower respiratory specimens during the acute phase of infection. The lowest concentration of SARS-CoV-2 viral copies this assay can detect is 250 copies / mL. A  negative result does not preclude SARS-CoV-2 infection and should not be used as the sole basis for treatment or other patient management decisions.  A negative result may occur with improper specimen collection / handling, submission of specimen other than nasopharyngeal swab, presence of viral mutation(s) within the areas targeted by this assay, and inadequate number of viral copies (<250 copies / mL). A negative result must be combined with clinical observations, patient history, and epidemiological information.  Fact Sheet for Patients:   BoilerBrush.com.cy  Fact Sheet for Healthcare Providers: https://pope.com/  This test is not yet approved or  cleared by the [place] FDA and has been authorized for detection and/or diagnosis of SARS-CoV-2 by FDA under an Emergency Use Authorization (EUA).  This EUA will remain in effect (meaning this test can be used) for the duration of the COVID-19 declaration under Section 564(b)(1) of the Act, 21 U.S.C. section 360bbb-3(b)(1), unless the authorization is terminated or revoked sooner.  Performed at [facility], [address], [place], [place] [address]       Studies: No results found.  Scheduled Meds: . atorvastatin  20 mg Oral Daily  . divalproex  500 mg Oral Daily  . divalproex  750 mg Oral QPM  . escitalopram  20 mg Oral Daily  . heparin  5,000 Units Subcutaneous Q8H  . insulin aspart  0-5 Units Subcutaneous QHS  . insulin aspart  0-9 Units Subcutaneous TID WC  . insulin aspart  4 Units Subcutaneous TID WC  . insulin glargine  20 Units Subcutaneous BID  .  levothyroxine  100 mcg Oral QAC breakfast  . QUEtiapine  300 mg Oral QHS    Continuous Infusions: . cefTRIAXone (ROCEPHIN)  IV 2 g (06/23/20 2119)     LOS: 3 days     Briant Cedar, MD [facility]  If 7PM-7AM, please contact night-coverage [email or web address] 06/24/2020, 3:30 PM

## 2020-06-25 DIAGNOSIS — N179 Acute kidney failure, unspecified: Secondary | ICD-10-CM | POA: Diagnosis not present

## 2020-06-25 DIAGNOSIS — Z590 Homelessness: Secondary | ICD-10-CM | POA: Diagnosis not present

## 2020-06-25 DIAGNOSIS — D72829 Elevated white blood cell count, unspecified: Secondary | ICD-10-CM | POA: Diagnosis not present

## 2020-06-25 DIAGNOSIS — E1010 Type 1 diabetes mellitus with ketoacidosis without coma: Secondary | ICD-10-CM | POA: Diagnosis not present

## 2020-06-25 LAB — CULTURE, BLOOD (ROUTINE X 2): Special Requests: ADEQUATE

## 2020-06-25 LAB — GLUCOSE, CAPILLARY
Glucose-Capillary: 262 mg/dL — ABNORMAL HIGH (ref 70–99)
Glucose-Capillary: 86 mg/dL (ref 70–99)
Glucose-Capillary: 209 mg/dL — ABNORMAL HIGH (ref 70–99)
Glucose-Capillary: 236 mg/dL — ABNORMAL HIGH (ref 70–99)

## 2020-06-25 MED ORDER — SODIUM CHLORIDE 0.9 % IV SOLN
1.0000 g | Freq: Three times a day (TID) | INTRAVENOUS | Status: DC
  Administered 2020-06-25 – 2020-06-26 (×2): 1 g via INTRAVENOUS
  Filled 2020-06-25 (×6): qty 1

## 2020-06-25 NOTE — Care Management (Signed)
Spoke to patient regarding possible d/c and ability to obtain insulin.  Patient states he will be staying with a friend and his friend will get him 70/30 from [employer] ($26).  Patient states he understands the hospital cannot keep paying for his insulin and he will continue to make arrangements.    Regarding [facility]: Patient states he has information and is aware that they will help him with medications.  He states no other clinic would be more convenient and he will call to make appointment. He states he does not need further assistance with this.

## 2020-06-25 NOTE — Progress Notes (Signed)
PROGRESS NOTE  Mark [name] QIO:[id number] DOB: 03-23-1986 DOA: 06/21/2020 PCP: Patient, No Pcp Per  HPI/Recap of past 24 hours: HPI from Dr Royanne Foots is a 34 y.o. male with medical history significant of DM1, homeless, frequent admits for DKA.  Last in ED 2 days ago.  He presents to ED with dizziness and vomiting and c/o high BGLs.  He has not taken insulin for 5 days due to no funds to afford it. ED Course: Pt with DKA and HHS: BGL 1178, osm 371, BHB > 8.  AG 35, pH 7.0, lactate 6.4, WBC 30k, AKI with creat 2.8.  Patient admitted for further management.     Today, patient reports feeling fatigue, denies any new complaints.     Assessment/Plan: Principal Problem:   DKA (diabetic ketoacidoses) (HCC) Active Problems:   Bipolar I disorder, most recent episode depressed, severe without psychotic features (HCC)   Homelessness   Seizure disorder (HCC)   Type 1 diabetes mellitus with hyperosmolar hyperglycemic state (HHS) (HCC)   AKI (acute kidney injury) (HCC)   Leukocytosis   DKA/type 1 diabetes mellitus Diabetic neuropathy On admission, glucose greater than 1000, bicarb 7, anion gap 38 Not currently taking any insulin as unable to afford it S/P insulin drip, IV fluids SSI, Lantus, NovoLog 3 times daily, Accu-Cheks, hypoglycemic protocol Restart home gabapentin for neuropathy  Possible sepsis ?gram neg rods bacteremia  Leukocytosis, tachycardic, tachypneic, lactic acidosis on admission Currently afebrile, with resolved leukocytosis ?? Possibly above signs from DKA UA with greater than 500 glucose, otherwise unremarkable BC x2, with 1 set growing Pseudomonas stutzeri, repeat pending Procalcitonin 12.34-8.93-->2.57 Chest x-ray unremarkable Switch to ceftazidime, d/c ceftriaxone  AKI Likely 2/2 DKA  Hypothyroidism Continue Synthroid  Seizure disorder Continue Depakote  Bipolar disorder Continue Lexapro, Seroquel  Obesity Lifestyle modification  advised        Malnutrition Type:      Malnutrition Characteristics:      Nutrition Interventions:       Estimated body mass index is 32.1 kg/m as calculated from the following:   Height as of this encounter: 6\' 2"  (1.88 m).   Weight as of this encounter: 113.4 kg.     Code Status: Full  Family Communication: None at bedside  Disposition Plan: Status is: Inpatient  Remains inpatient appropriate because:Inpatient level of care appropriate due to severity of illness   Dispo: The patient is from: Homeless              Anticipated d/c is to: TBD              Anticipated d/c date is: 1 day              Patient currently is not medically stable to d/c.     Consultants:  None  Procedures:  None  Antimicrobials:  Ceftazidime  DVT prophylaxis: Heparin [place]   Objective: Vitals:   06/25/20 0401 06/25/20 0833 06/25/20 1341 06/25/20 1619  BP: 107/78 133/86 118/75 105/70  Pulse: 60 71 65 71  Resp: 16 12 14 12   Temp: 97.9 F (36.6 C) 98.1 F (36.7 C) 98.5 F (36.9 C) 98.1 F (36.7 C)  TempSrc: Oral Oral Oral Oral  SpO2: 96% 99% 98% 97%  Weight:      Height:        Intake/Output Summary (Last 24 hours) at 06/25/2020 1732 Last data filed at 06/25/2020 0237 Gross per 24 hour  Intake --  Output 1150 ml  Net -1150  ml   Filed Weights   06/21/20 1745  Weight: 113.4 kg    Exam:  General: NAD  Cardiovascular: S1, S2 present  Respiratory: CTAB  Abdomen: Soft, nontender, nondistended, bowel sounds present  Musculoskeletal: No bilateral pedal edema noted  Skin: Normal  Psychiatry:  Normal mood    Data Reviewed: CBC: Recent Labs  Lab 06/19/20 2024 06/21/20 2004 06/21/20 2300 06/23/20 0040 06/24/20 0951  WBC 10.8* 30.6*  --  17.2* 7.1  NEUTROABS  --   --   --  13.0* 4.4  HGB 12.6* 13.4 15.6 11.9* 13.2  HCT 39.9 45.3 46.0 34.5* 41.5  MCV 93.7 98.9  --  86.9 92.2  PLT 294 473*  --  303 230   Basic Metabolic Panel: Recent Labs   Lab 06/22/20 1049 06/22/20 1230 06/22/20 1551 06/23/20 0040 06/24/20 0951  NA 139 141 139 137 140  K 4.1 4.7 3.8 3.2* 4.8  CL 100 103 104 103 106  CO2 19* 21* 23 23 20*  GLUCOSE 275* 246* 210* 158* 250*  BUN 33* 30* 24* 16 8  CREATININE 2.18* 2.05* 1.61* 1.21 1.03  CALCIUM 9.0 9.3 9.2 8.9 9.2   GFR: Estimated Creatinine Clearance: 132.8 mL/min (by C-G formula based on SCr of 1.03 mg/dL). Liver Function Tests: No results for input(s): AST, ALT, ALKPHOS, BILITOT, PROT, ALBUMIN in the last 168 hours. No results for input(s): LIPASE, AMYLASE in the last 168 hours. No results for input(s): AMMONIA in the last 168 hours. Coagulation Profile: Recent Labs  Lab 06/20/20 2212  INR 1.2   Cardiac Enzymes: No results for input(s): CKTOTAL, CKMB, CKMBINDEX, TROPONINI in the last 168 hours. BNP (last 3 results) No results for input(s): PROBNP in the last 8760 hours. HbA1C: Recent Labs    06/22/20 1900  HGBA1C 8.2*   CBG: Recent Labs  Lab 06/24/20 1614 06/24/20 2109 06/25/20 0607 06/25/20 1124 06/25/20 1618  GLUCAP 85 257* 262* 86 209*   Lipid Profile: No results for input(s): CHOL, HDL, LDLCALC, TRIG, CHOLHDL, LDLDIRECT in the last 72 hours. Thyroid Function Tests: No results for input(s): TSH, T4TOTAL, FREET4, T3FREE, THYROIDAB in the last 72 hours. Anemia Panel: No results for input(s): VITAMINB12, FOLATE, FERRITIN, TIBC, IRON, RETICCTPCT in the last 72 hours. Urine analysis:    Component Value Date/Time   COLORURINE YELLOW 06/21/2020 2308   APPEARANCEUR CLEAR 06/21/2020 2308   APPEARANCEUR Clear 01/20/2015 2136   LABSPEC 1.018 06/21/2020 2308   LABSPEC 1.023 01/20/2015 2136   PHURINE 5.0 06/21/2020 2308   GLUCOSEU >=500 (A) 06/21/2020 2308   GLUCOSEU >=500 01/20/2015 2136   HGBUR SMALL (A) 06/21/2020 2308   BILIRUBINUR NEGATIVE 06/21/2020 2308   BILIRUBINUR Negative 01/20/2015 2136   KETONESUR 20 (A) 06/21/2020 2308   PROTEINUR NEGATIVE 06/21/2020 2308    NITRITE NEGATIVE 06/21/2020 2308   LEUKOCYTESUR NEGATIVE 06/21/2020 2308   LEUKOCYTESUR Negative 01/20/2015 2136   Sepsis Labs: @LABRCNTIP (procalcitonin:4,lacticidven:4)  ) Recent Results (from the past 240 hour(s))  Culture, blood (routine x 2)     Status: None (Preliminary result)   Collection Time: 06/21/20 10:13 PM   Specimen: BLOOD  Result Value Ref Range Status   Specimen Description BLOOD SITE NOT SPECIFIED  Final   Special Requests   Final    BOTTLES DRAWN AEROBIC AND ANAEROBIC Blood Culture results may not be optimal due to an inadequate volume of blood received in culture bottles   Culture   Final    NO GROWTH 4 DAYS Performed at [facility], [address] [place], [place] [address]    Report Status PENDING  Incomplete  Culture, blood (routine x 2)     Status: Abnormal   Collection Time: 06/21/20 10:50 PM   Specimen: BLOOD RIGHT FOREARM  Result Value Ref Range Status   Specimen Description BLOOD RIGHT FOREARM  Final   Special Requests   Final    BOTTLES DRAWN AEROBIC AND ANAEROBIC Blood Culture adequate volume   Culture  Setup Time   Final    AEROBIC BOTTLE ONLY GRAM NEGATIVE RODS CRITICAL RESULT CALLED TO, READ BACK BY AND VERIFIED WITHArnaldo Natal: J CARNEY [facility] 06/23/20 [id number] JDW Performed at [facility], [address] [place], [place] [address]    Culture PSEUDOMONAS STUTZERI (A)  Final   Report Status 06/25/2020 FINAL  Final   Organism ID, Bacteria PSEUDOMONAS STUTZERI  Final      Susceptibility   Pseudomonas stutzeri - MIC*    CEFTAZIDIME <=1 SENSITIVE Sensitive     CIPROFLOXACIN <=0.25 SENSITIVE Sensitive     GENTAMICIN <=1 SENSITIVE Sensitive     IMIPENEM 0.5 SENSITIVE Sensitive     PIP/TAZO <=4 SENSITIVE Sensitive     * PSEUDOMONAS STUTZERI  Blood Culture ID Panel (Reflexed)     Status: None   Collection Time: 06/21/20 10:50 PM  Result Value Ref Range Status   Enterococcus faecalis NOT DETECTED NOT DETECTED Final   Enterococcus Faecium NOT  DETECTED NOT DETECTED Final   Listeria monocytogenes NOT DETECTED NOT DETECTED Final   Staphylococcus species NOT DETECTED NOT DETECTED Final   Staphylococcus aureus (BCID) NOT DETECTED NOT DETECTED Final   Staphylococcus epidermidis NOT DETECTED NOT DETECTED Final   Staphylococcus lugdunensis NOT DETECTED NOT DETECTED Final   Streptococcus species NOT DETECTED NOT DETECTED Final   Streptococcus agalactiae NOT DETECTED NOT DETECTED Final   Streptococcus pneumoniae NOT DETECTED NOT DETECTED Final   Streptococcus pyogenes NOT DETECTED NOT DETECTED Final   A.calcoaceticus-baumannii NOT DETECTED NOT DETECTED Final   Bacteroides fragilis NOT DETECTED NOT DETECTED Final   Enterobacterales NOT DETECTED NOT DETECTED Final   Enterobacter cloacae complex NOT DETECTED NOT DETECTED Final   Escherichia coli NOT DETECTED NOT DETECTED Final   Klebsiella aerogenes NOT DETECTED NOT DETECTED Final   Klebsiella oxytoca NOT DETECTED NOT DETECTED Final   Klebsiella pneumoniae NOT DETECTED NOT DETECTED Final   Proteus species NOT DETECTED NOT DETECTED Final   Salmonella species NOT DETECTED NOT DETECTED Final   Serratia marcescens NOT DETECTED NOT DETECTED Final   Haemophilus influenzae NOT DETECTED NOT DETECTED Final   Neisseria meningitidis NOT DETECTED NOT DETECTED Final   Pseudomonas aeruginosa NOT DETECTED NOT DETECTED Final   Stenotrophomonas maltophilia NOT DETECTED NOT DETECTED Final   Candida albicans NOT DETECTED NOT DETECTED Final   Candida auris NOT DETECTED NOT DETECTED Final   Candida glabrata NOT DETECTED NOT DETECTED Final   Candida krusei NOT DETECTED NOT DETECTED Final   Candida parapsilosis NOT DETECTED NOT DETECTED Final   Candida tropicalis NOT DETECTED NOT DETECTED Final   Cryptococcus neoformans/gattii NOT DETECTED NOT DETECTED Final    Comment: Performed at [facility], [address] [place], [place] [address]  SARS Coronavirus 2 by RT PCR (hospital order, performed in  [facility]) Nasopharyngeal Nasopharyngeal Swab     Status: None   Collection Time: 06/21/20 11:08 PM   Specimen: Nasopharyngeal Swab  Result Value Ref Range Status   SARS Coronavirus 2 NEGATIVE NEGATIVE Final  Comment: (NOTE) SARS-CoV-2 target nucleic acids are NOT DETECTED.  The SARS-CoV-2 RNA is generally detectable in upper and lower respiratory specimens during the acute phase of infection. The lowest concentration of SARS-CoV-2 viral copies this assay can detect is 250 copies / mL. A negative result does not preclude SARS-CoV-2 infection and should not be used as the sole basis for treatment or other patient management decisions.  A negative result may occur with improper specimen collection / handling, submission of specimen other than nasopharyngeal swab, presence of viral mutation(s) within the areas targeted by this assay, and inadequate number of viral copies (<250 copies / mL). A negative result must be combined with clinical observations, patient history, and epidemiological information.  Fact Sheet for Patients:   BoilerBrush.com.cy  Fact Sheet for Healthcare Providers: https://pope.com/  This test is not yet approved or  cleared by the [place] FDA and has been authorized for detection and/or diagnosis of SARS-CoV-2 by FDA under an Emergency Use Authorization (EUA).  This EUA will remain in effect (meaning this test can be used) for the duration of the COVID-19 declaration under Section 564(b)(1) of the Act, 21 U.S.C. section 360bbb-3(b)(1), unless the authorization is terminated or revoked sooner.  Performed at [facility], [address], [place], [place] [address]   Culture, blood (routine x 2)     Status: None (Preliminary result)   Collection Time: 06/24/20  6:04 PM   Specimen: BLOOD  Result Value Ref Range Status   Specimen Description BLOOD RIGHT ANTECUBITAL  Final   Special  Requests   Final    BOTTLES DRAWN AEROBIC AND ANAEROBIC Blood Culture adequate volume   Culture   Final    NO GROWTH < 24 HOURS Performed at [facility], [address] [place], [place] [address]    Report Status PENDING  Incomplete  Culture, blood (routine x 2)     Status: None (Preliminary result)   Collection Time: 06/24/20  6:09 PM   Specimen: BLOOD  Result Value Ref Range Status   Specimen Description BLOOD LEFT ANTECUBITAL  Final   Special Requests   Final    BOTTLES DRAWN AEROBIC ONLY Blood Culture results may not be optimal due to an inadequate volume of blood received in culture bottles   Culture   Final    NO GROWTH < 24 HOURS Performed at [facility], [address], [place], [place] [address]    Report Status PENDING  Incomplete      Studies: No results found.  Scheduled Meds: . atorvastatin  20 mg Oral Daily  . divalproex  500 mg Oral Daily  . divalproex  750 mg Oral QPM  . escitalopram  20 mg Oral Daily  . gabapentin  100 mg Oral BID  . heparin  5,000 Units Subcutaneous Q8H  . insulin aspart  0-5 Units Subcutaneous QHS  . insulin aspart  0-9 Units Subcutaneous TID WC  . insulin aspart  4 Units Subcutaneous TID WC  . insulin glargine  20 Units Subcutaneous BID  . levothyroxine  100 mcg Oral QAC breakfast  . QUEtiapine  300 mg Oral QHS    Continuous Infusions: . cefTAZidime (FORTAZ)  IV       LOS: 4 days     Briant Cedar, MD [facility]  If 7PM-7AM, please contact night-coverage [email or web address] 06/25/2020, 5:32 PM

## 2020-06-25 NOTE — Progress Notes (Signed)
Pharmacy Antibiotic Note  Mark [name] is a 34 y.o. male admitted on 06/21/2020 with bacteremia and sepsis.  Pharmacy has been consulted for ceftazidime dosing. Patient is afebrile, WBC WNL. Renal function improved to baseline.   Plan: Ceftazidime 2g IV every 8 hours  Monitor renal function, C&S, LOT  Height: 6\' 2"  (188 cm) Weight: 113.4 kg (250 lb) IBW/kg (Calculated) : 82.2  Temp (24hrs), Avg:98.2 F (36.8 C), Min:97.9 F (36.6 C), Max:98.5 F (36.9 C)  Recent Labs  Lab 06/19/20 2024 06/19/20 2024 06/21/20 2004 06/21/20 2216 06/21/20 2250 06/22/20 0423 06/22/20 0557 06/22/20 1049 06/22/20 1230 06/22/20 1551 06/23/20 0040 06/24/20 0951  WBC 10.8*  --  30.6*  --   --   --   --   --   --   --  17.2* 7.1  CREATININE 1.33*   < > 2.82*  --    < >   < >  --  2.18* 2.05* 1.61* 1.21 1.03  LATICACIDVEN  --   --   --  6.4*  5.7*  --   --  2.8* 2.0*  --   --   --   --    < > = values in this interval not displayed.    Estimated Creatinine Clearance: 132.8 mL/min (by C-G formula based on SCr of 1.03 mg/dL).    Allergies  Allergen Reactions  . Mushroom Extract Complex Hives, Itching and Nausea And Vomiting  . Penicillins Anaphylaxis, Hives and Swelling    Has patient had a PCN reaction causing immediate rash, facial/tongue/throat swelling, SOB or lightheadedness with hypotension: Yes Has patient had a PCN reaction causing severe rash involving mucus membranes or skin necrosis: No Has patient had a PCN reaction that required hospitalization: Yes Has patient had a PCN reaction occurring within the last 10 years: Yes If all of the above answers are "NO", then may proceed with Cephalosporin use. Tolerated ceftriaxone 03/24/20  . Sulfa Antibiotics Anaphylaxis and Hives  . Clindamycin/Lincomycin Hives    Antimicrobials this admission: Vanc 9/1>> 9/3 11/3 9/1>>9/1 Ceftriaxone 9/1>>9/3 Ceftazidime 9/4>>   Dose adjustments this admission: N/A  Microbiology results: 8/31  Bcx >> 1/4 with GNR; pseudomonas stutzeri - pan sens  9/3 Bcx >> sent  Thank you for allowing pharmacy to be a part of this patient's care.  11/3, PharmD PGY1 Acute Care Pharmacy Resident Phone: [phone] 06/25/2020 2:03 PM  Please check [email or web address] for unit specific pharmacy phone numbers.

## 2020-06-26 DIAGNOSIS — F314 Bipolar disorder, current episode depressed, severe, without psychotic features: Secondary | ICD-10-CM | POA: Diagnosis not present

## 2020-06-26 DIAGNOSIS — E1010 Type 1 diabetes mellitus with ketoacidosis without coma: Secondary | ICD-10-CM | POA: Diagnosis not present

## 2020-06-26 DIAGNOSIS — N179 Acute kidney failure, unspecified: Secondary | ICD-10-CM | POA: Diagnosis not present

## 2020-06-26 DIAGNOSIS — Z590 Homelessness: Secondary | ICD-10-CM | POA: Diagnosis not present

## 2020-06-26 LAB — GLUCOSE, CAPILLARY
Glucose-Capillary: 221 mg/dL — ABNORMAL HIGH (ref 70–99)
Glucose-Capillary: 38 mg/dL — CL (ref 70–99)
Glucose-Capillary: 138 mg/dL — ABNORMAL HIGH (ref 70–99)

## 2020-06-26 LAB — CBC WITH DIFFERENTIAL/PLATELET
Abs Immature Granulocytes: 0.02 10*3/uL (ref 0.00–0.07)
Basophils Absolute: 0 10*3/uL (ref 0.0–0.1)
Basophils Relative: 0 %
Eosinophils Absolute: 0.2 10*3/uL (ref 0.0–0.5)
Eosinophils Relative: 3 %
HCT: 39.3 % (ref 39.0–52.0)
Hemoglobin: 12.9 g/dL — ABNORMAL LOW (ref 13.0–17.0)
Immature Granulocytes: 0 %
Lymphocytes Relative: 45 %
Lymphs Abs: 3.1 10*3/uL (ref 0.7–4.0)
MCH: 29.3 pg (ref 26.0–34.0)
MCHC: 32.8 g/dL (ref 30.0–36.0)
MCV: 89.1 fL (ref 80.0–100.0)
Monocytes Absolute: 0.4 10*3/uL (ref 0.1–1.0)
Monocytes Relative: 6 %
Neutro Abs: 3.2 10*3/uL (ref 1.7–7.7)
Neutrophils Relative %: 46 %
Platelets: 228 10*3/uL (ref 150–400)
RBC: 4.41 MIL/uL (ref 4.22–5.81)
RDW: 13.2 % (ref 11.5–15.5)
WBC: 6.9 10*3/uL (ref 4.0–10.5)
nRBC: 0 % (ref 0.0–0.2)

## 2020-06-26 LAB — CULTURE, BLOOD (ROUTINE X 2): Culture: NO GROWTH

## 2020-06-26 LAB — BASIC METABOLIC PANEL
Anion gap: 11 (ref 5–15)
BUN: 11 mg/dL (ref 6–20)
CO2: 25 mmol/L (ref 22–32)
Calcium: 9.3 mg/dL (ref 8.9–10.3)
Chloride: 107 mmol/L (ref 98–111)
Creatinine, Ser: 0.83 mg/dL (ref 0.61–1.24)
GFR calc Af Amer: >60 mL/min (ref >60)
GFR calc non Af Amer: >60 mL/min (ref >60)
Glucose, Bld: 102 mg/dL — ABNORMAL HIGH (ref 70–99)
Potassium: 3.9 mmol/L (ref 3.5–5.1)
Sodium: 143 mmol/L (ref 135–145)

## 2020-06-26 MED ORDER — INSULIN ASPART PROT & ASPART (70-30 MIX) 100 UNIT/ML ~~LOC~~ SUSP: 35.0000 [IU] | Freq: Two times a day (BID) | SUBCUTANEOUS | 0 refills | Status: DC

## 2020-06-26 MED ORDER — INSULIN SYRINGES (DISPOSABLE) U-100 0.5 ML MISC: 0 refills | Status: DC

## 2020-06-26 MED ORDER — CIPROFLOXACIN HCL 500 MG PO TABS: 500.0000 mg | ORAL_TABLET | Freq: Two times a day (BID) | ORAL | 0 refills | Status: AC

## 2020-06-26 NOTE — Progress Notes (Signed)
Plan of care reviewed. Pt's progressing. He's hemodynamically stable. Afebrile. No immediate distress noted at night. Will monitor.  Filiberto Pinks, RN

## 2020-06-26 NOTE — Progress Notes (Addendum)
Hypoglycemic Event  CBG:25 ,38  Treatment: D50 and 8oz of regular soda  Symptoms: Hungry  Follow-up CBG: Time:1140 CBG Result:138  Possible Reasons for Event: medication   Comments/MD notified Dr. Tor Netters, Randall An

## 2020-06-26 NOTE — Discharge Summary (Signed)
Discharge Summary  Mark [name] GUR:[id number] DOB: 12-10-1985  PCP: Patient, No Pcp Per  Admit date: 06/21/2020 Discharge date: 06/26/2020  Time spent: 45 mins  Recommendations for Outpatient Follow-up:  1. Discussed extensively with patient on the need to establish care with [facility] so as to have follow-up and assistance with medication including insulin, patient verbalized understanding  Discharge Diagnoses:  Active Hospital Problems   Diagnosis Date Noted  . DKA (diabetic ketoacidoses) ([place]) 11/24/2015  . Leukocytosis 06/22/2020  . AKI (acute kidney injury) ([place]) 04/10/2020  . Type 1 diabetes mellitus with hyperosmolar hyperglycemic state (HHS) ([place]) 12/30/2019  . Seizure disorder ([place]) 12/29/2019  . Homelessness 12/29/2019  . Bipolar I disorder, most recent episode depressed, severe without psychotic features [facility])     Resolved Hospital Problems  No resolved problems to display.    Discharge Condition: Stable  Diet recommendation: Heart healthy/mod carb  Vitals:   06/26/20 0839 06/26/20 1110  BP: 97/63 127/74  Pulse: 70 71  Resp: 12 16  Temp: 98.6 F (37 C) 98.3 F (36.8 C)  SpO2: 96% 97%    History of present illness:  Mark [name] a 34 y.o.malewith medical history significant ofDM1, homeless, frequent admits for DKA. Last in ED 2 days ago. He presents to ED with dizziness and vomiting and c/o high BGLs. He has not taken insulin for 5 days due to no funds to afford it. ED Course:Pt with DKA and HHS: BGL 1178, osm 371, BHB >8. AG 35, pH 7.0, lactate 6.4, WBC 30k, AKI with creat 2.8.  Patient admitted for further management.   Today, patient noted to have hypoglycemic episode, likely 2/2 insulin, reported feeling hungry. Improved with IV D50, crackers and soda.  Discussed extensively with patient, has multiple social issues as well as mental health issues.  Currently homeless but adamantly states that he will be staying with his  friend, and his friend is going to assist him with insulin purchase in [place].  Advised patient to ensure he establishes care with [facility] for follow-up and for medication assistance.  Patient is a frequent flyer, and is increased risk of readmission. Please see TOC note for further details   Hospital Course:  Principal Problem:   DKA (diabetic ketoacidoses) ([place]) Active Problems:   Bipolar I disorder, most recent episode depressed, severe without psychotic features ([place])   Homelessness   Seizure disorder ([place])   Type 1 diabetes mellitus with hyperosmolar hyperglycemic state (HHS) ([place])   AKI (acute kidney injury) ([place])   Leukocytosis   DKA/type 1 diabetes mellitus Diabetic neuropathy On admission, glucose greater than 1000, bicarb 7, anion gap 38 Not currently taking any insulin as unable to afford it S/P insulin drip, IV fluids Discussed with diabetes coordinator, recommend Novolin ReliOn 70/30 mix insulin 35 units twice daily (this will be the most affordable for pt at this time)  ?Possible sepsis Pseudomonas stutzeri bacteremia  Leukocytosis, tachycardic, tachypneic, lactic acidosis on admission Currently afebrile, with resolved leukocytosis UA with greater than 500 glucose, otherwise unremarkable BC x2, with 1 set growing Pseudomonas stutzeri, repeat showed no growth Procalcitonin 12.34-8.93-->2.57 Chest x-ray unremarkable Discussed with ID, Dr Linus Salmons on 06/25/20, recommended 5 days of ciprofloxacin, but could be a contaminant/not significant S/p ceftazidime<--ceftriaxone  AKI Likely 2/2 DKA  Hypothyroidism Continue Synthroid  Seizure disorder Continue Depakote  Bipolar disorder Continue Lexapro, Seroquel  Obesity Lifestyle modification advised         Malnutrition Type:      Malnutrition Characteristics:  Nutrition Interventions:      Estimated body mass index is 32.1 kg/m as calculated from the following:   Height as of this encounter:  6' 2"  (1.88 m).   Weight as of this encounter: 113.4 kg.    Procedures:  None  Consultations:  None  Discharge Exam: BP 127/74 (BP Location: Left Arm)   Pulse 71   Temp 98.3 F (36.8 C) (Oral)   Resp 16   Ht 6' 2"  (1.88 m)   Wt 113.4 kg   SpO2 97%   BMI 32.10 kg/m   General: NAD  Cardiovascular: S1, S2 present Respiratory: CTAB   Discharge Instructions You were cared for by a hospitalist during your hospital stay. If you have any questions about your discharge medications or the care you received while you were in the hospital after you are discharged, you can call the unit and asked to speak with the hospitalist on call if the hospitalist that took care of you is not available. Once you are discharged, your primary care physician will handle any further medical issues. Please note that NO REFILLS for any discharge medications will be authorized once you are discharged, as it is imperative that you return to your primary care physician (or establish a relationship with a primary care physician if you do not have one) for your aftercare needs so that they can reassess your need for medications and monitor your lab values.  Discharge Instructions    Diet - low sodium heart healthy   Complete by: As directed    Increase activity slowly   Complete by: As directed      Allergies as of 06/26/2020      Reactions   Mushroom Extract Complex Hives, Itching, Nausea And Vomiting   Penicillins Anaphylaxis, Hives, Swelling   Has patient had a PCN reaction causing immediate rash, facial/tongue/throat swelling, SOB or lightheadedness with hypotension: Yes Has patient had a PCN reaction causing severe rash involving mucus membranes or skin necrosis: No Has patient had a PCN reaction that required hospitalization: Yes Has patient had a PCN reaction occurring within the last 10 years: Yes If all of the above answers are "NO", then may proceed with Cephalosporin use. Tolerated ceftriaxone  03/24/20   Sulfa Antibiotics Anaphylaxis, Hives   Clindamycin/lincomycin Hives      Medication List    STOP taking these medications   insulin detemir 100 UNIT/ML FlexPen Commonly known as: LEVEMIR   NovoLIN R FlexPen 100 UNIT/ML Sopn Generic drug: Insulin Regular Human     TAKE these medications   atorvastatin 20 MG tablet Commonly known as: LIPITOR Take 1 tablet (20 mg total) by mouth daily.   blood glucose meter kit and supplies Kit Dispense based on patient and insurance preference. Use up to four times daily as directed. (FOR ICD-9 250.00, 250.01).   ciprofloxacin 500 MG tablet Commonly known as: Cipro Take 1 tablet (500 mg total) by mouth 2 (two) times daily for 4 days.   divalproex 500 MG DR tablet Commonly known as: DEPAKOTE Take 1 tablet (500 mg total) by mouth daily. For mood stabilization   divalproex 250 MG DR tablet Commonly known as: DEPAKOTE Take 3 tablets (750 mg total) by mouth every evening. For mood stabilization   escitalopram 20 MG tablet Commonly known as: LEXAPRO Take 1 tablet (20 mg total) by mouth daily. For depression   gabapentin 100 MG capsule Commonly known as: NEURONTIN Take 100 mg by mouth 2 (two) times daily.   insulin  aspart protamine- aspart (70-30) 100 UNIT/ML injection Commonly known as: NovoLOG Mix 70/30 Inject 0.35 mLs (35 Units total) into the skin 2 (two) times daily with a meal.   Insulin Syringes (Disposable) U-100 0.5 ML Misc For insulin   levothyroxine 100 MCG tablet Commonly known as: SYNTHROID Take 1 tablet (100 mcg total) by mouth daily before breakfast.   Pen Needles 31G X 5 MM Misc 1 Container by Does not apply route 4 (four) times daily -  with meals and at bedtime.   QUEtiapine 300 MG tablet Commonly known as: SEROQUEL Take 1 tablet (300 mg total) by mouth at bedtime. For mood control      Allergies  Allergen Reactions  . Mushroom Extract Complex Hives, Itching and Nausea And Vomiting  . Penicillins  Anaphylaxis, Hives and Swelling    Has patient had a PCN reaction causing immediate rash, facial/tongue/throat swelling, SOB or lightheadedness with hypotension: Yes Has patient had a PCN reaction causing severe rash involving mucus membranes or skin necrosis: No Has patient had a PCN reaction that required hospitalization: Yes Has patient had a PCN reaction occurring within the last 10 years: Yes If all of the above answers are "NO", then may proceed with Cephalosporin use. Tolerated ceftriaxone 03/24/20  . Sulfa Antibiotics Anaphylaxis and Hives  . Clindamycin/Lincomycin Hives    Follow-up Information    [facility]. Schedule an appointment as soon as possible for a visit in 1 week(s).   Why: Call to make appointment, for follow up. Clinic will help with your medications but you have to go to the appointment Contact information: [address] [place] [address] [phone]               The results of significant diagnostics from this hospitalization (including imaging, microbiology, ancillary and laboratory) are listed below for reference.    Significant Diagnostic Studies: DG CHEST PORT 1 VIEW  Result Date: 06/22/2020 CLINICAL DATA:  Hyperglycemia EXAM: PORTABLE CHEST 1 VIEW COMPARISON:  04/30/2020 FINDINGS: Single frontal view of the chest demonstrates an unremarkable cardiac silhouette. No airspace disease, effusion, or pneumothorax. No acute bony abnormality. IMPRESSION: 1. No acute intrathoracic process. Electronically Signed   By: Randa Ngo M.D.   On: 06/22/2020 01:05    Microbiology: Recent Results (from the past 240 hour(s))  Culture, blood (routine x 2)     Status: None   Collection Time: 06/21/20 10:13 PM   Specimen: BLOOD  Result Value Ref Range Status   Specimen Description BLOOD SITE NOT SPECIFIED  Final   Special Requests   Final    BOTTLES DRAWN AEROBIC AND ANAEROBIC Blood Culture results may not be  optimal due to an inadequate volume of blood received in culture bottles   Culture   Final    NO GROWTH 5 DAYS Performed at [facility], [place] [address], [place], [place] [address]    Report Status 06/26/2020 FINAL  Final  Culture, blood (routine x 2)     Status: Abnormal   Collection Time: 06/21/20 10:50 PM   Specimen: BLOOD RIGHT FOREARM  Result Value Ref Range Status   Specimen Description BLOOD RIGHT FOREARM  Final   Special Requests   Final    BOTTLES DRAWN AEROBIC AND ANAEROBIC Blood Culture adequate volume   Culture  Setup Time   Final    AEROBIC BOTTLE ONLY GRAM NEGATIVE RODS CRITICAL RESULT CALLED TO, READ BACK BY AND VERIFIED WITH: J CARNEY [facility] 06/23/20  0640 JDW Performed at [facility], [place] [address], [place], [place] [address]    Culture PSEUDOMONAS STUTZERI (A)  Final   Report Status 06/25/2020 FINAL  Final   Organism ID, Bacteria PSEUDOMONAS STUTZERI  Final      Susceptibility   Pseudomonas stutzeri - MIC*    CEFTAZIDIME <=1 SENSITIVE Sensitive     CIPROFLOXACIN <=0.25 SENSITIVE Sensitive     GENTAMICIN <=1 SENSITIVE Sensitive     IMIPENEM 0.5 SENSITIVE Sensitive     PIP/TAZO <=4 SENSITIVE Sensitive     * PSEUDOMONAS STUTZERI  Blood Culture ID Panel (Reflexed)     Status: None   Collection Time: 06/21/20 10:50 PM  Result Value Ref Range Status   Enterococcus faecalis NOT DETECTED NOT DETECTED Final   Enterococcus Faecium NOT DETECTED NOT DETECTED Final   Listeria monocytogenes NOT DETECTED NOT DETECTED Final   Staphylococcus species NOT DETECTED NOT DETECTED Final   Staphylococcus aureus (BCID) NOT DETECTED NOT DETECTED Final   Staphylococcus epidermidis NOT DETECTED NOT DETECTED Final   Staphylococcus lugdunensis NOT DETECTED NOT DETECTED Final   Streptococcus species NOT DETECTED NOT DETECTED Final   Streptococcus agalactiae NOT DETECTED NOT DETECTED Final   Streptococcus pneumoniae NOT DETECTED NOT DETECTED Final   Streptococcus pyogenes NOT  DETECTED NOT DETECTED Final   A.calcoaceticus-baumannii NOT DETECTED NOT DETECTED Final   Bacteroides fragilis NOT DETECTED NOT DETECTED Final   Enterobacterales NOT DETECTED NOT DETECTED Final   Enterobacter cloacae complex NOT DETECTED NOT DETECTED Final   Escherichia coli NOT DETECTED NOT DETECTED Final   Klebsiella aerogenes NOT DETECTED NOT DETECTED Final   Klebsiella oxytoca NOT DETECTED NOT DETECTED Final   Klebsiella pneumoniae NOT DETECTED NOT DETECTED Final   Proteus species NOT DETECTED NOT DETECTED Final   Salmonella species NOT DETECTED NOT DETECTED Final   Serratia marcescens NOT DETECTED NOT DETECTED Final   Haemophilus influenzae NOT DETECTED NOT DETECTED Final   Neisseria meningitidis NOT DETECTED NOT DETECTED Final   Pseudomonas aeruginosa NOT DETECTED NOT DETECTED Final   Stenotrophomonas maltophilia NOT DETECTED NOT DETECTED Final   Candida albicans NOT DETECTED NOT DETECTED Final   Candida auris NOT DETECTED NOT DETECTED Final   Candida glabrata NOT DETECTED NOT DETECTED Final   Candida krusei NOT DETECTED NOT DETECTED Final   Candida parapsilosis NOT DETECTED NOT DETECTED Final   Candida tropicalis NOT DETECTED NOT DETECTED Final   Cryptococcus neoformans/gattii NOT DETECTED NOT DETECTED Final    Comment: Performed at [facility], [place] [address] [place], [place] [address]  SARS Coronavirus 2 by RT PCR (hospital order, performed in [facility] hospital lab) Nasopharyngeal Nasopharyngeal Swab     Status: None   Collection Time: 06/21/20 11:08 PM   Specimen: Nasopharyngeal Swab  Result Value Ref Range Status   SARS Coronavirus 2 NEGATIVE NEGATIVE Final    Comment: (NOTE) SARS-CoV-2 target nucleic acids are NOT DETECTED.  The SARS-CoV-2 RNA is generally detectable in upper and lower respiratory specimens during the acute phase of infection. The lowest concentration of SARS-CoV-2 viral copies this assay can detect is 250 copies / mL. A negative result does  not preclude SARS-CoV-2 infection and should not be used as the sole basis for treatment or other patient management decisions.  A negative result may occur with improper specimen collection / handling, submission of specimen other than nasopharyngeal swab, presence of viral mutation(s) within the areas targeted by this assay, and inadequate number of viral copies (<250 copies /  mL). A negative result must be combined with clinical observations, patient history, and epidemiological information.  Fact Sheet for Patients:   StrictlyIdeas.no  Fact Sheet for Healthcare Providers: BankingDealers.co.za  This test is not yet approved or  cleared by the [place] FDA and has been authorized for detection and/or diagnosis of SARS-CoV-2 by FDA under an Emergency Use Authorization (EUA).  This EUA will remain in effect (meaning this test can be used) for the duration of the COVID-19 declaration under Section 564(b)(1) of the Act, 21 U.S.C. section 360bbb-3(b)(1), unless the authorization is terminated or revoked sooner.  Performed at [facility], [place] [address], [place], [place] [address]   Culture, blood (routine x 2)     Status: None (Preliminary result)   Collection Time: 06/24/20  6:04 PM   Specimen: BLOOD  Result Value Ref Range Status   Specimen Description BLOOD RIGHT ANTECUBITAL  Final   Special Requests   Final    BOTTLES DRAWN AEROBIC AND ANAEROBIC Blood Culture adequate volume   Culture   Final    NO GROWTH 2 DAYS Performed at [facility], [place] [address], [place], [place] [address]    Report Status PENDING  Incomplete  Culture, blood (routine x 2)     Status: None (Preliminary result)   Collection Time: 06/24/20  6:09 PM   Specimen: BLOOD  Result Value Ref Range Status   Specimen Description BLOOD LEFT ANTECUBITAL  Final   Special Requests   Final    BOTTLES DRAWN AEROBIC ONLY Blood Culture results may not be  optimal due to an inadequate volume of blood received in culture bottles   Culture   Final    NO GROWTH 2 DAYS Performed at [facility], [place] [address], [place], [place] [address]    Report Status PENDING  Incomplete     Labs: Basic Metabolic Panel: Recent Labs  Lab 06/22/20 1230 06/22/20 1551 06/23/20 0040 06/24/20 0951 06/26/20 0718  NA 141 139 137 140 143  K 4.7 3.8 3.2* 4.8 3.9  CL 103 104 103 106 107  CO2 21* 23 23 20* 25  GLUCOSE 246* 210* 158* 250* 102*  BUN 30* 24* 16 8 11   CREATININE 2.05* 1.61* 1.21 1.03 0.83  CALCIUM 9.3 9.2 8.9 9.2 9.3   Liver Function Tests: No results for input(s): AST, ALT, ALKPHOS, BILITOT, PROT, ALBUMIN in the last 168 hours. No results for input(s): LIPASE, AMYLASE in the last 168 hours. No results for input(s): AMMONIA in the last 168 hours. CBC: Recent Labs  Lab 06/19/20 2024 06/19/20 2024 06/21/20 2004 06/21/20 2300 06/23/20 0040 06/24/20 0951 06/26/20 0718  WBC 10.8*  --  30.6*  --  17.2* 7.1 6.9  NEUTROABS  --   --   --   --  13.0* 4.4 3.2  HGB 12.6*   < > 13.4 15.6 11.9* 13.2 12.9*  HCT 39.9   < > 45.3 46.0 34.5* 41.5 39.3  MCV 93.7  --  98.9  --  86.9 92.2 89.1  PLT 294  --  473*  --  303 230 228   < > = values in this interval not displayed.   Cardiac Enzymes: No results for input(s): CKTOTAL, CKMB, CKMBINDEX, TROPONINI in the last 168 hours. BNP: BNP (last 3 results) No results for input(s): BNP in the last 8760 hours.  ProBNP (last 3 results) No results for input(s): PROBNP in the last 8760 hours.  CBG: Recent Labs  Lab  06/25/20 1618 06/25/20 2046 06/26/20 0526 06/26/20 1108 06/26/20 1148  GLUCAP 209* 236* 221* 38* 138*       Signed:  Alma Friendly, MD [facility] 06/26/2020, 2:11 PM

## 2020-06-26 NOTE — Progress Notes (Signed)
Spoke with patient on the phone about getting his insulin after discharge. States that he can get the Novolin Relion insulin 70/30 vial for $25. Will need prescription for syringes. States that he needs strips for his Relion home blood glucose meter. States that he does not have a PCP outside of hospital.   Recommend Novolin Relion 70/30 mix insulin vial (order #[id number]) Insulin syringes U-100: 0.5 ml (order # [id number]) OR 1 ml (order #[id number]).  Insulin dosage recommended: 70/30 mix 35 units BID (before breakfast and supper) This dosage is equal to the Lantus 20 units BID that he is taking in the hospital.   Smith Mince RN BSN CDE Diabetes Coordinator Pager: [phone]  8am-5pm

## 2020-06-26 NOTE — Progress Notes (Signed)
Patient given discharge instructions, medication list and follow up appointments. Patient given paper prescriptions. Patient verbalized understanding. IV and tele were removed. Will discharge home as ordered. Margean Korell, Randall An RN

## 2020-06-29 LAB — CULTURE, BLOOD (ROUTINE X 2)
Culture: NO GROWTH
Culture: NO GROWTH
Special Requests: ADEQUATE

## 2020-09-02 ENCOUNTER — Inpatient Hospital Stay (HOSPITAL_COMMUNITY)
Admission: EM | Admit: 2020-09-02 | Discharge: 2020-10-01 | DRG: 207 | Disposition: A | Discharge status: Home / Self Care | Payer: Medicaid Other | Attending: Internal Medicine | Admitting: Internal Medicine

## 2020-09-02 ENCOUNTER — Emergency Department (HOSPITAL_COMMUNITY): Payer: Medicaid Other

## 2020-09-02 ENCOUNTER — Encounter: Payer: Self-pay | Admitting: Neurology

## 2020-09-02 ENCOUNTER — Inpatient Hospital Stay (HOSPITAL_COMMUNITY): Payer: Medicaid Other

## 2020-09-02 ENCOUNTER — Other Ambulatory Visit: Payer: Self-pay

## 2020-09-02 DIAGNOSIS — Z20822 Contact with and (suspected) exposure to covid-19: Secondary | ICD-10-CM | POA: Diagnosis present

## 2020-09-02 DIAGNOSIS — D6489 Other specified anemias: Secondary | ICD-10-CM | POA: Diagnosis present

## 2020-09-02 DIAGNOSIS — Z0189 Encounter for other specified special examinations: Secondary | ICD-10-CM | POA: Diagnosis not present

## 2020-09-02 DIAGNOSIS — Z9151 Personal history of suicidal behavior: Secondary | ICD-10-CM

## 2020-09-02 DIAGNOSIS — I824Y1 Acute embolism and thrombosis of unspecified deep veins of right proximal lower extremity: Secondary | ICD-10-CM | POA: Diagnosis not present

## 2020-09-02 DIAGNOSIS — J9600 Acute respiratory failure, unspecified whether with hypoxia or hypercapnia: Secondary | ICD-10-CM

## 2020-09-02 DIAGNOSIS — J9601 Acute respiratory failure with hypoxia: Secondary | ICD-10-CM | POA: Diagnosis present

## 2020-09-02 DIAGNOSIS — E876 Hypokalemia: Secondary | ICD-10-CM | POA: Diagnosis present

## 2020-09-02 DIAGNOSIS — E875 Hyperkalemia: Secondary | ICD-10-CM | POA: Diagnosis present

## 2020-09-02 DIAGNOSIS — F17210 Nicotine dependence, cigarettes, uncomplicated: Secondary | ICD-10-CM | POA: Diagnosis present

## 2020-09-02 DIAGNOSIS — F314 Bipolar disorder, current episode depressed, severe, without psychotic features: Secondary | ICD-10-CM | POA: Diagnosis not present

## 2020-09-02 DIAGNOSIS — F99 Mental disorder, not otherwise specified: Secondary | ICD-10-CM

## 2020-09-02 DIAGNOSIS — R45851 Suicidal ideations: Secondary | ICD-10-CM | POA: Diagnosis present

## 2020-09-02 DIAGNOSIS — G9341 Metabolic encephalopathy: Secondary | ICD-10-CM | POA: Diagnosis present

## 2020-09-02 DIAGNOSIS — E785 Hyperlipidemia, unspecified: Secondary | ICD-10-CM | POA: Diagnosis present

## 2020-09-02 DIAGNOSIS — F5105 Insomnia due to other mental disorder: Secondary | ICD-10-CM | POA: Diagnosis not present

## 2020-09-02 DIAGNOSIS — E10649 Type 1 diabetes mellitus with hypoglycemia without coma: Secondary | ICD-10-CM | POA: Diagnosis not present

## 2020-09-02 DIAGNOSIS — Z803 Family history of malignant neoplasm of breast: Secondary | ICD-10-CM

## 2020-09-02 DIAGNOSIS — E1042 Type 1 diabetes mellitus with diabetic polyneuropathy: Secondary | ICD-10-CM | POA: Diagnosis present

## 2020-09-02 DIAGNOSIS — Z794 Long term (current) use of insulin: Secondary | ICD-10-CM

## 2020-09-02 DIAGNOSIS — Q030 Malformations of aqueduct of Sylvius: Secondary | ICD-10-CM

## 2020-09-02 DIAGNOSIS — R509 Fever, unspecified: Secondary | ICD-10-CM

## 2020-09-02 DIAGNOSIS — Z882 Allergy status to sulfonamides status: Secondary | ICD-10-CM

## 2020-09-02 DIAGNOSIS — F319 Bipolar disorder, unspecified: Secondary | ICD-10-CM | POA: Diagnosis not present

## 2020-09-02 DIAGNOSIS — D638 Anemia in other chronic diseases classified elsewhere: Secondary | ICD-10-CM | POA: Diagnosis present

## 2020-09-02 DIAGNOSIS — R262 Difficulty in walking, not elsewhere classified: Secondary | ICD-10-CM | POA: Diagnosis not present

## 2020-09-02 DIAGNOSIS — Z880 Allergy status to penicillin: Secondary | ICD-10-CM

## 2020-09-02 DIAGNOSIS — Z5320 Procedure and treatment not carried out because of patient's decision for unspecified reasons: Secondary | ICD-10-CM | POA: Diagnosis not present

## 2020-09-02 DIAGNOSIS — F419 Anxiety disorder, unspecified: Secondary | ICD-10-CM | POA: Diagnosis present

## 2020-09-02 DIAGNOSIS — N179 Acute kidney failure, unspecified: Secondary | ICD-10-CM | POA: Diagnosis present

## 2020-09-02 DIAGNOSIS — R4182 Altered mental status, unspecified: Secondary | ICD-10-CM | POA: Diagnosis present

## 2020-09-02 DIAGNOSIS — Z9911 Dependence on respirator [ventilator] status: Secondary | ICD-10-CM | POA: Diagnosis not present

## 2020-09-02 DIAGNOSIS — R531 Weakness: Secondary | ICD-10-CM | POA: Diagnosis present

## 2020-09-02 DIAGNOSIS — F32A Depression, unspecified: Secondary | ICD-10-CM | POA: Diagnosis not present

## 2020-09-02 DIAGNOSIS — Z79890 Hormone replacement therapy (postmenopausal): Secondary | ICD-10-CM

## 2020-09-02 DIAGNOSIS — T381X6A Underdosing of thyroid hormones and substitutes, initial encounter: Secondary | ICD-10-CM | POA: Diagnosis present

## 2020-09-02 DIAGNOSIS — F3130 Bipolar disorder, current episode depressed, mild or moderate severity, unspecified: Secondary | ICD-10-CM | POA: Diagnosis present

## 2020-09-02 DIAGNOSIS — J690 Pneumonitis due to inhalation of food and vomit: Principal | ICD-10-CM | POA: Diagnosis present

## 2020-09-02 DIAGNOSIS — R5381 Other malaise: Secondary | ICD-10-CM

## 2020-09-02 DIAGNOSIS — E1011 Type 1 diabetes mellitus with ketoacidosis with coma: Secondary | ICD-10-CM | POA: Diagnosis not present

## 2020-09-02 DIAGNOSIS — E039 Hypothyroidism, unspecified: Secondary | ICD-10-CM | POA: Diagnosis present

## 2020-09-02 DIAGNOSIS — R6510 Systemic inflammatory response syndrome (SIRS) of non-infectious origin without acute organ dysfunction: Secondary | ICD-10-CM

## 2020-09-02 DIAGNOSIS — E1065 Type 1 diabetes mellitus with hyperglycemia: Secondary | ICD-10-CM | POA: Diagnosis not present

## 2020-09-02 DIAGNOSIS — F1210 Cannabis abuse, uncomplicated: Secondary | ICD-10-CM | POA: Diagnosis not present

## 2020-09-02 DIAGNOSIS — Z91138 Patient's unintentional underdosing of medication regimen for other reason: Secondary | ICD-10-CM | POA: Diagnosis not present

## 2020-09-02 DIAGNOSIS — E1010 Type 1 diabetes mellitus with ketoacidosis without coma: Secondary | ICD-10-CM | POA: Diagnosis present

## 2020-09-02 DIAGNOSIS — E1110 Type 2 diabetes mellitus with ketoacidosis without coma: Secondary | ICD-10-CM | POA: Diagnosis present

## 2020-09-02 DIAGNOSIS — Z87442 Personal history of urinary calculi: Secondary | ICD-10-CM

## 2020-09-02 DIAGNOSIS — G911 Obstructive hydrocephalus: Secondary | ICD-10-CM | POA: Diagnosis present

## 2020-09-02 DIAGNOSIS — R1310 Dysphagia, unspecified: Secondary | ICD-10-CM | POA: Diagnosis not present

## 2020-09-02 DIAGNOSIS — Z79899 Other long term (current) drug therapy: Secondary | ICD-10-CM

## 2020-09-02 DIAGNOSIS — G40901 Epilepsy, unspecified, not intractable, with status epilepticus: Secondary | ICD-10-CM | POA: Diagnosis not present

## 2020-09-02 DIAGNOSIS — J988 Other specified respiratory disorders: Secondary | ICD-10-CM | POA: Diagnosis not present

## 2020-09-02 DIAGNOSIS — R42 Dizziness and giddiness: Secondary | ICD-10-CM | POA: Diagnosis not present

## 2020-09-02 DIAGNOSIS — R569 Unspecified convulsions: Secondary | ICD-10-CM | POA: Insufficient documentation

## 2020-09-02 DIAGNOSIS — R269 Unspecified abnormalities of gait and mobility: Secondary | ICD-10-CM | POA: Diagnosis not present

## 2020-09-02 DIAGNOSIS — J9690 Respiratory failure, unspecified, unspecified whether with hypoxia or hypercapnia: Secondary | ICD-10-CM

## 2020-09-02 DIAGNOSIS — Z881 Allergy status to other antibiotic agents status: Secondary | ICD-10-CM

## 2020-09-02 DIAGNOSIS — G40909 Epilepsy, unspecified, not intractable, without status epilepticus: Secondary | ICD-10-CM | POA: Diagnosis present

## 2020-09-02 DIAGNOSIS — R069 Unspecified abnormalities of breathing: Secondary | ICD-10-CM

## 2020-09-02 DIAGNOSIS — M79652 Pain in left thigh: Secondary | ICD-10-CM | POA: Diagnosis not present

## 2020-09-02 DIAGNOSIS — G4700 Insomnia, unspecified: Secondary | ICD-10-CM | POA: Diagnosis not present

## 2020-09-02 DIAGNOSIS — Z5900 Homelessness unspecified: Secondary | ICD-10-CM

## 2020-09-02 DIAGNOSIS — R5383 Other fatigue: Secondary | ICD-10-CM | POA: Diagnosis not present

## 2020-09-02 DIAGNOSIS — E109 Type 1 diabetes mellitus without complications: Secondary | ICD-10-CM | POA: Diagnosis not present

## 2020-09-02 LAB — CBC WITH DIFFERENTIAL/PLATELET
Abs Immature Granulocytes: 0.64 10*3/uL — ABNORMAL HIGH (ref 0.00–0.07)
Basophils Absolute: 0.1 10*3/uL (ref 0.0–0.1)
Basophils Relative: 0 %
Eosinophils Absolute: 0 10*3/uL (ref 0.0–0.5)
Eosinophils Relative: 0 %
HCT: 40.8 % (ref 39.0–52.0)
Hemoglobin: 13.2 g/dL (ref 13.0–17.0)
Immature Granulocytes: 2 %
Lymphocytes Relative: 3 %
Lymphs Abs: 0.9 10*3/uL (ref 0.7–4.0)
MCH: 30.7 pg (ref 26.0–34.0)
MCHC: 32.4 g/dL (ref 30.0–36.0)
MCV: 94.9 fL (ref 80.0–100.0)
Monocytes Absolute: 2.2 10*3/uL — ABNORMAL HIGH (ref 0.1–1.0)
Monocytes Relative: 7 %
Neutro Abs: 29.2 10*3/uL — ABNORMAL HIGH (ref 1.7–7.7)
Neutrophils Relative %: 88 %
Platelets: 304 10*3/uL (ref 150–400)
RBC: 4.3 MIL/uL (ref 4.22–5.81)
RDW: 12.8 % (ref 11.5–15.5)
WBC: 33.1 10*3/uL — ABNORMAL HIGH (ref 4.0–10.5)
nRBC: 0 % (ref 0.0–0.2)

## 2020-09-02 LAB — RAPID URINE DRUG SCREEN, HOSP PERFORMED
Amphetamines: NOT DETECTED
Barbiturates: NOT DETECTED
Benzodiazepines: NOT DETECTED
Cocaine: NOT DETECTED
Opiates: NOT DETECTED
Tetrahydrocannabinol: NOT DETECTED

## 2020-09-02 LAB — I-STAT ARTERIAL BLOOD GAS, ED
Acid-base deficit: 3 mmol/L — ABNORMAL HIGH (ref 0.0–2.0)
Bicarbonate: 23.2 mmol/L (ref 20.0–28.0)
Calcium, Ion: 1.21 mmol/L (ref 1.15–1.40)
HCT: 33 % — ABNORMAL LOW (ref 39.0–52.0)
Hemoglobin: 11.2 g/dL — ABNORMAL LOW (ref 13.0–17.0)
O2 Saturation: 96 %
Potassium: 4.4 mmol/L (ref 3.5–5.1)
Sodium: 140 mmol/L (ref 135–145)
TCO2: 25 mmol/L (ref 22–32)
pCO2 arterial: 45 mmHg (ref 32.0–48.0)
pH, Arterial: 7.32 — ABNORMAL LOW (ref 7.350–7.450)
pO2, Arterial: 87 mmHg (ref 83.0–108.0)

## 2020-09-02 LAB — BASIC METABOLIC PANEL
Anion gap: 19 — ABNORMAL HIGH (ref 5–15)
Anion gap: 12 (ref 5–15)
Anion gap: 10 (ref 5–15)
Anion gap: 9 (ref 5–15)
BUN: 17 mg/dL (ref 6–20)
BUN: 17 mg/dL (ref 6–20)
BUN: 17 mg/dL (ref 6–20)
BUN: 15 mg/dL (ref 6–20)
CO2: 16 mmol/L — ABNORMAL LOW (ref 22–32)
CO2: 20 mmol/L — ABNORMAL LOW (ref 22–32)
CO2: 20 mmol/L — ABNORMAL LOW (ref 22–32)
CO2: 23 mmol/L (ref 22–32)
Calcium: 8.4 mg/dL — ABNORMAL LOW (ref 8.9–10.3)
Calcium: 8.9 mg/dL (ref 8.9–10.3)
Calcium: 8.5 mg/dL — ABNORMAL LOW (ref 8.9–10.3)
Calcium: 8.9 mg/dL (ref 8.9–10.3)
Chloride: 95 mmol/L — ABNORMAL LOW (ref 98–111)
Chloride: 105 mmol/L (ref 98–111)
Chloride: 103 mmol/L (ref 98–111)
Chloride: 105 mmol/L (ref 98–111)
Creatinine, Ser: 2.25 mg/dL — ABNORMAL HIGH (ref 0.61–1.24)
Creatinine, Ser: 1.39 mg/dL — ABNORMAL HIGH (ref 0.61–1.24)
Creatinine, Ser: 1.25 mg/dL — ABNORMAL HIGH (ref 0.61–1.24)
Creatinine, Ser: 1.15 mg/dL (ref 0.61–1.24)
GFR, Estimated: 38 mL/min — ABNORMAL LOW (ref >60)
GFR, Estimated: >60 mL/min (ref >60)
GFR, Estimated: >60 mL/min (ref >60)
GFR, Estimated: >60 mL/min (ref >60)
Glucose, Bld: 646 mg/dL (ref 70–99)
Glucose, Bld: 193 mg/dL — ABNORMAL HIGH (ref 70–99)
Glucose, Bld: 177 mg/dL — ABNORMAL HIGH (ref 70–99)
Glucose, Bld: 188 mg/dL — ABNORMAL HIGH (ref 70–99)
Potassium: 7 mmol/L (ref 3.5–5.1)
Potassium: 4.5 mmol/L (ref 3.5–5.1)
Potassium: 4.1 mmol/L (ref 3.5–5.1)
Potassium: 3.7 mmol/L (ref 3.5–5.1)
Sodium: 130 mmol/L — ABNORMAL LOW (ref 135–145)
Sodium: 137 mmol/L (ref 135–145)
Sodium: 133 mmol/L — ABNORMAL LOW (ref 135–145)
Sodium: 137 mmol/L (ref 135–145)

## 2020-09-02 LAB — PROTIME-INR
INR: 1.3 — ABNORMAL HIGH (ref 0.8–1.2)
Prothrombin Time: 15.9 s — ABNORMAL HIGH (ref 11.4–15.2)

## 2020-09-02 LAB — AMMONIA: Ammonia: 38 umol/L — ABNORMAL HIGH (ref 9–35)

## 2020-09-02 LAB — CBC
HCT: 35.2 % — ABNORMAL LOW (ref 39.0–52.0)
Hemoglobin: 11.5 g/dL — ABNORMAL LOW (ref 13.0–17.0)
MCH: 30.8 pg (ref 26.0–34.0)
MCHC: 32.7 g/dL (ref 30.0–36.0)
MCV: 94.4 fL (ref 80.0–100.0)
Platelets: 223 10*3/uL (ref 150–400)
RBC: 3.73 MIL/uL — ABNORMAL LOW (ref 4.22–5.81)
RDW: 12.9 % (ref 11.5–15.5)
WBC: 23 10*3/uL — ABNORMAL HIGH (ref 4.0–10.5)
nRBC: 0 % (ref 0.0–0.2)

## 2020-09-02 LAB — BASIC METABOLIC PANEL WITH GFR
Anion gap: 13 (ref 5–15)
BUN: 18 mg/dL (ref 6–20)
CO2: 21 mmol/L — ABNORMAL LOW (ref 22–32)
Calcium: 8.3 mg/dL — ABNORMAL LOW (ref 8.9–10.3)
Chloride: 103 mmol/L (ref 98–111)
Creatinine, Ser: 1.73 mg/dL — ABNORMAL HIGH (ref 0.61–1.24)
GFR, Estimated: 52 mL/min — ABNORMAL LOW
Glucose, Bld: 288 mg/dL — ABNORMAL HIGH (ref 70–99)
Potassium: 4.7 mmol/L (ref 3.5–5.1)
Sodium: 137 mmol/L (ref 135–145)

## 2020-09-02 LAB — GLUCOSE, CAPILLARY
Glucose-Capillary: 221 mg/dL — ABNORMAL HIGH (ref 70–99)
Glucose-Capillary: 213 mg/dL — ABNORMAL HIGH (ref 70–99)
Glucose-Capillary: 176 mg/dL — ABNORMAL HIGH (ref 70–99)
Glucose-Capillary: 158 mg/dL — ABNORMAL HIGH (ref 70–99)
Glucose-Capillary: 195 mg/dL — ABNORMAL HIGH (ref 70–99)
Glucose-Capillary: 187 mg/dL — ABNORMAL HIGH (ref 70–99)
Glucose-Capillary: 178 mg/dL — ABNORMAL HIGH (ref 70–99)
Glucose-Capillary: 187 mg/dL — ABNORMAL HIGH (ref 70–99)
Glucose-Capillary: 183 mg/dL — ABNORMAL HIGH (ref 70–99)
Glucose-Capillary: 193 mg/dL — ABNORMAL HIGH (ref 70–99)
Glucose-Capillary: 192 mg/dL — ABNORMAL HIGH (ref 70–99)

## 2020-09-02 LAB — LACTIC ACID, PLASMA
Lactic Acid, Venous: 6.9 mmol/L (ref 0.5–1.9)
Lactic Acid, Venous: 5.4 mmol/L (ref 0.5–1.9)
Lactic Acid, Venous: 3.7 mmol/L (ref 0.5–1.9)

## 2020-09-02 LAB — STREP PNEUMONIAE URINARY ANTIGEN: Strep Pneumo Urinary Antigen: NEGATIVE

## 2020-09-02 LAB — I-STAT VENOUS BLOOD GAS, ED
Acid-base deficit: 8 mmol/L — ABNORMAL HIGH (ref 0.0–2.0)
Bicarbonate: 19 mmol/L — ABNORMAL LOW (ref 20.0–28.0)
Calcium, Ion: 0.98 mmol/L — ABNORMAL LOW (ref 1.15–1.40)
HCT: 42 % (ref 39.0–52.0)
Hemoglobin: 14.3 g/dL (ref 13.0–17.0)
O2 Saturation: 99 %
Potassium: 7 mmol/L (ref 3.5–5.1)
Sodium: 129 mmol/L — ABNORMAL LOW (ref 135–145)
TCO2: 20 mmol/L — ABNORMAL LOW (ref 22–32)
pCO2, Ven: 43 mmHg — ABNORMAL LOW (ref 44.0–60.0)
pH, Ven: 7.254 (ref 7.250–7.430)
pO2, Ven: 164 mmHg — ABNORMAL HIGH (ref 32.0–45.0)

## 2020-09-02 LAB — CBG MONITORING, ED
Glucose-Capillary: 586 mg/dL (ref 70–99)
Glucose-Capillary: 474 mg/dL — ABNORMAL HIGH (ref 70–99)
Glucose-Capillary: 309 mg/dL — ABNORMAL HIGH (ref 70–99)
Glucose-Capillary: 406 mg/dL — ABNORMAL HIGH (ref 70–99)
Glucose-Capillary: 193 mg/dL — ABNORMAL HIGH (ref 70–99)
Glucose-Capillary: 159 mg/dL — ABNORMAL HIGH (ref 70–99)
Glucose-Capillary: 197 mg/dL — ABNORMAL HIGH (ref 70–99)

## 2020-09-02 LAB — URINALYSIS, ROUTINE W REFLEX MICROSCOPIC
Bacteria, UA: NONE SEEN
Bilirubin Urine: NEGATIVE
Glucose, UA: >=500 mg/dL — AB
Ketones, ur: 5 mg/dL — AB
Leukocytes,Ua: NEGATIVE
Nitrite: NEGATIVE
Protein, ur: 30 mg/dL — AB
Specific Gravity, Urine: 1.013 (ref 1.005–1.030)
pH: 5 (ref 5.0–8.0)

## 2020-09-02 LAB — RESPIRATORY PANEL BY RT PCR (FLU A&B, COVID)
Influenza A by PCR: NEGATIVE
Influenza B by PCR: NEGATIVE
SARS Coronavirus 2 by RT PCR: NEGATIVE

## 2020-09-02 LAB — AMYLASE: Amylase: 154 U/L — ABNORMAL HIGH (ref 28–100)

## 2020-09-02 LAB — VALPROIC ACID LEVEL: Valproic Acid Lvl: <10 ug/mL — ABNORMAL LOW (ref 50.0–100.0)

## 2020-09-02 LAB — BETA-HYDROXYBUTYRIC ACID
Beta-Hydroxybutyric Acid: 1.56 mmol/L — ABNORMAL HIGH (ref 0.05–0.27)
Beta-Hydroxybutyric Acid: 0.14 mmol/L (ref 0.05–0.27)
Beta-Hydroxybutyric Acid: 0.32 mmol/L — ABNORMAL HIGH (ref 0.05–0.27)

## 2020-09-02 LAB — HEPATIC FUNCTION PANEL
ALT: 157 U/L — ABNORMAL HIGH (ref 0–44)
AST: 364 U/L — ABNORMAL HIGH (ref 15–41)
Albumin: 4.3 g/dL (ref 3.5–5.0)
Alkaline Phosphatase: 109 U/L (ref 38–126)
Bilirubin, Direct: 0.1 mg/dL (ref 0.0–0.2)
Indirect Bilirubin: 0.7 mg/dL (ref 0.3–0.9)
Total Bilirubin: 0.8 mg/dL (ref 0.3–1.2)
Total Protein: 7.3 g/dL (ref 6.5–8.1)

## 2020-09-02 LAB — CK: Total CK: 998 U/L — ABNORMAL HIGH (ref 49–397)

## 2020-09-02 LAB — ETHANOL: Alcohol, Ethyl (B): <10 mg/dL (ref <10)

## 2020-09-02 LAB — CORTISOL: Cortisol, Plasma: 16.3 ug/dL

## 2020-09-02 LAB — LIPASE, BLOOD: Lipase: 20 U/L (ref 11–51)

## 2020-09-02 LAB — APTT: aPTT: 29 seconds (ref 24–36)

## 2020-09-02 LAB — MRSA PCR SCREENING: MRSA by PCR: NEGATIVE

## 2020-09-02 LAB — PROCALCITONIN: Procalcitonin: 9.96 ng/mL

## 2020-09-02 MED ORDER — ETOMIDATE 2 MG/ML IV SOLN
INTRAVENOUS | Status: AC | PRN
  Administered 2020-09-02: 20 mg via INTRAVENOUS

## 2020-09-02 MED ORDER — CALCIUM GLUCONATE-NACL 1-0.675 GM/50ML-% IV SOLN
1.0000 g | Freq: Once | INTRAVENOUS | Status: AC
  Administered 2020-09-02: 1000 mg via INTRAVENOUS
  Filled 2020-09-02: qty 50

## 2020-09-02 MED ORDER — DOCUSATE SODIUM 100 MG PO CAPS: 100.0000 mg | ORAL_CAPSULE | Freq: Two times a day (BID) | ORAL | Status: DC | PRN

## 2020-09-02 MED ORDER — SODIUM CHLORIDE 0.9 % IV SOLN: 2.0000 g | Freq: Once | INTRAVENOUS | Status: DC

## 2020-09-02 MED ORDER — VANCOMYCIN HCL 750 MG/150ML IV SOLN
750.0000 mg | Freq: Two times a day (BID) | INTRAVENOUS | Status: DC
  Administered 2020-09-02: 750 mg via INTRAVENOUS
  Filled 2020-09-02 (×2): qty 150

## 2020-09-02 MED ORDER — VANCOMYCIN VARIABLE DOSE PER UNSTABLE RENAL FUNCTION (PHARMACIST DOSING): Status: DC

## 2020-09-02 MED ORDER — FENTANYL CITRATE (PF) 100 MCG/2ML IJ SOLN
INTRAMUSCULAR | Status: AC | PRN
Start: 2020-09-02 — End: 2020-09-02
  Administered 2020-09-02: 100 ug via INTRAVENOUS

## 2020-09-02 MED ORDER — PANTOPRAZOLE SODIUM 40 MG IV SOLR
40.0000 mg | Freq: Every day | INTRAVENOUS | Status: DC
  Administered 2020-09-02: 40 mg via INTRAVENOUS
  Filled 2020-09-02: qty 40

## 2020-09-02 MED ORDER — SODIUM CHLORIDE 0.9 % IV SOLN
2.0000 g | Freq: Once | INTRAVENOUS | Status: AC
  Administered 2020-09-02: 2 g via INTRAVENOUS
  Filled 2020-09-02: qty 2

## 2020-09-02 MED ORDER — LORAZEPAM 2 MG/ML IJ SOLN
INTRAMUSCULAR | Status: AC
  Administered 2020-09-02: 2 mg via INTRAVENOUS
  Filled 2020-09-02: qty 1

## 2020-09-02 MED ORDER — FENTANYL CITRATE (PF) 100 MCG/2ML IJ SOLN
INTRAMUSCULAR | Status: AC
  Filled 2020-09-02: qty 2

## 2020-09-02 MED ORDER — DOCUSATE SODIUM 50 MG/5ML PO LIQD: 100.0000 mg | Freq: Two times a day (BID) | ORAL | Status: DC

## 2020-09-02 MED ORDER — MIDAZOLAM HCL 2 MG/2ML IJ SOLN
1.0000 mg | INTRAMUSCULAR | Status: DC | PRN
  Administered 2020-09-02 (×2): 2 mg via INTRAVENOUS
  Filled 2020-09-02 (×2): qty 2

## 2020-09-02 MED ORDER — POLYETHYLENE GLYCOL 3350 17 G PO PACK: 17.0000 g | PACK | Freq: Every day | ORAL | Status: DC | PRN

## 2020-09-02 MED ORDER — FENTANYL 2500MCG IN NS 250ML (10MCG/ML) PREMIX INFUSION
50.0000 ug/h | INTRAVENOUS | Status: DC
  Administered 2020-09-02: 50 ug/h via INTRAVENOUS
  Administered 2020-09-03: 100 ug/h via INTRAVENOUS
  Filled 2020-09-02 (×2): qty 250

## 2020-09-02 MED ORDER — ORAL CARE MOUTH RINSE
15.0000 mL | OROMUCOSAL | Status: DC
  Administered 2020-09-02 – 2020-09-07 (×45): 15 mL via OROMUCOSAL

## 2020-09-02 MED ORDER — PANTOPRAZOLE SODIUM 40 MG IV SOLR: 40.0000 mg | Freq: Every day | INTRAVENOUS | Status: DC

## 2020-09-02 MED ORDER — LORAZEPAM 2 MG/ML IJ SOLN: 2.0000 mg | Freq: Once | INTRAMUSCULAR | Status: AC

## 2020-09-02 MED ORDER — DEXTROSE 50 % IV SOLN: 0.0000 mL | INTRAVENOUS | Status: DC | PRN

## 2020-09-02 MED ORDER — FENTANYL CITRATE (PF) 100 MCG/2ML IJ SOLN: 50.0000 ug | Freq: Once | INTRAMUSCULAR | Status: DC

## 2020-09-02 MED ORDER — LACTATED RINGERS IV BOLUS
20.0000 mL/kg | Freq: Once | INTRAVENOUS | Status: AC
  Administered 2020-09-02: 2260 mL via INTRAVENOUS

## 2020-09-02 MED ORDER — INSULIN REGULAR(HUMAN) IN NACL 100-0.9 UT/100ML-% IV SOLN
INTRAVENOUS | Status: DC
  Administered 2020-09-02: 8.5 [IU]/h via INTRAVENOUS
  Filled 2020-09-02: qty 100

## 2020-09-02 MED ORDER — FENTANYL BOLUS VIA INFUSION: 50.0000 ug | INTRAVENOUS | Status: DC | PRN

## 2020-09-02 MED ORDER — SODIUM CHLORIDE 0.9 % IV SOLN
1.0000 g | Freq: Once | INTRAVENOUS | Status: DC
  Filled 2020-09-02: qty 10

## 2020-09-02 MED ORDER — POLYETHYLENE GLYCOL 3350 17 G PO PACK: 17.0000 g | PACK | Freq: Every day | ORAL | Status: DC

## 2020-09-02 MED ORDER — METRONIDAZOLE IN NACL 5-0.79 MG/ML-% IV SOLN
500.0000 mg | Freq: Once | INTRAVENOUS | Status: AC
  Administered 2020-09-02: 500 mg via INTRAVENOUS
  Filled 2020-09-02: qty 100

## 2020-09-02 MED ORDER — VANCOMYCIN HCL IN DEXTROSE 1-5 GM/200ML-% IV SOLN: 1000.0000 mg | Freq: Once | INTRAVENOUS | Status: DC

## 2020-09-02 MED ORDER — VANCOMYCIN HCL 2000 MG/400ML IV SOLN
2000.0000 mg | Freq: Once | INTRAVENOUS | Status: AC
  Administered 2020-09-02: 2000 mg via INTRAVENOUS
  Filled 2020-09-02: qty 400

## 2020-09-02 MED ORDER — CHLORHEXIDINE GLUCONATE 0.12% ORAL RINSE (MEDLINE KIT)
15.0000 mL | Freq: Two times a day (BID) | OROMUCOSAL | Status: DC
  Administered 2020-09-02 – 2020-09-06 (×10): 15 mL via OROMUCOSAL

## 2020-09-02 MED ORDER — CHLORHEXIDINE GLUCONATE CLOTH 2 % EX PADS
6.0000 | MEDICATED_PAD | Freq: Every day | CUTANEOUS | Status: DC
  Administered 2020-09-02 – 2020-09-29 (×15): 6 via TOPICAL

## 2020-09-02 MED ORDER — DOCUSATE SODIUM 50 MG/5ML PO LIQD
100.0000 mg | Freq: Two times a day (BID) | ORAL | Status: DC
  Administered 2020-09-02: 100 mg

## 2020-09-02 MED ORDER — LEVETIRACETAM IN NACL 1000 MG/100ML IV SOLN
1000.0000 mg | Freq: Once | INTRAVENOUS | Status: AC
  Administered 2020-09-02: 1000 mg via INTRAVENOUS
  Filled 2020-09-02: qty 100

## 2020-09-02 MED ORDER — DEXTROSE IN LACTATED RINGERS 5 % IV SOLN: INTRAVENOUS | Status: DC

## 2020-09-02 MED ORDER — MIDAZOLAM HCL 5 MG/5ML IJ SOLN
INTRAMUSCULAR | Status: AC | PRN
  Administered 2020-09-02: 2 mg via INTRAVENOUS

## 2020-09-02 MED ORDER — MIDAZOLAM HCL 2 MG/2ML IJ SOLN
INTRAMUSCULAR | Status: AC
  Filled 2020-09-02: qty 2

## 2020-09-02 MED ORDER — ROCURONIUM BROMIDE 50 MG/5ML IV SOLN
INTRAVENOUS | Status: AC | PRN
  Administered 2020-09-02: 100 mg via INTRAVENOUS

## 2020-09-02 MED ORDER — FENTANYL 2500MCG IN NS 250ML (10MCG/ML) PREMIX INFUSION: 50.0000 ug/h | INTRAVENOUS | Status: DC

## 2020-09-02 MED ORDER — PROPOFOL 1000 MG/100ML IV EMUL: 0.0000 ug/kg/min | INTRAVENOUS | Status: DC

## 2020-09-02 MED ORDER — LACTATED RINGERS IV BOLUS (SEPSIS)
2000.0000 mL | Freq: Once | INTRAVENOUS | Status: AC
  Administered 2020-09-02: 2000 mL via INTRAVENOUS

## 2020-09-02 MED ORDER — SODIUM CHLORIDE 0.9 % IV SOLN: 2.0000 g | INTRAVENOUS | Status: DC

## 2020-09-02 MED ORDER — LACTATED RINGERS IV SOLN: INTRAVENOUS | Status: DC

## 2020-09-02 MED ORDER — FENTANYL BOLUS VIA INFUSION
50.0000 ug | INTRAVENOUS | Status: DC | PRN
  Filled 2020-09-02: qty 50

## 2020-09-02 MED ORDER — DOCUSATE SODIUM 50 MG/5ML PO LIQD
100.0000 mg | Freq: Two times a day (BID) | ORAL | Status: DC
  Filled 2020-09-02 (×3): qty 10

## 2020-09-02 MED ORDER — SODIUM CHLORIDE 0.9 % IV SOLN
2.0000 g | Freq: Three times a day (TID) | INTRAVENOUS | Status: DC
  Administered 2020-09-02 – 2020-09-03 (×2): 2 g via INTRAVENOUS
  Filled 2020-09-02 (×2): qty 2

## 2020-09-02 NOTE — Progress Notes (Signed)
Pharmacy Antibiotic Note  Mark [name] is a 34 y.o. male admitted on 09/02/2020 with sepsis.  Pharmacy has been consulted for vancomycin and aztreonam dosing.  Noted pcn allergy, pt has tolerated multiple cephalosporins in past and will proceed with cefepime.    Patient's renal fx has improved significantly this AM. WBC down to 23. Will plan to increase abx dosages.   Plan: Start vancomycin 750 mg IV Q 12 hours  Increase Cefepime to 2 gm IV Q 8 hours  Monitor renal function, Cx and clinical progression to narrow Vancomycin trough at steady state  Height: 6\' 2"  (188 cm) Weight: 103.6 kg (228 lb 6.3 oz) IBW/kg (Calculated) : 82.2  Temp (24hrs), Avg:99 F (37.2 C), Min:98 F (36.7 C), Max:99.8 F (37.7 C)  Recent Labs  Lab 09/02/20 0605 09/02/20 0606 09/02/20 0838 09/02/20 0844  WBC 33.1*  --   --  23.0*  CREATININE 2.25*  --   --  1.73*  LATICACIDVEN  --  6.9* 5.4*  --     Estimated Creatinine Clearance: 75.8 mL/min (A) (by C-G formula based on SCr of 1.73 mg/dL (H)).    Allergies  Allergen Reactions  . Mushroom Extract Complex Hives, Itching and Nausea And Vomiting  . Penicillins Anaphylaxis, Hives and Swelling    Has patient had a PCN reaction causing immediate rash, facial/tongue/throat swelling, SOB or lightheadedness with hypotension: Yes Has patient had a PCN reaction causing severe rash involving mucus membranes or skin necrosis: No Has patient had a PCN reaction that required hospitalization: Yes Has patient had a PCN reaction occurring within the last 10 years: Yes If all of the above answers are "NO", then may proceed with Cephalosporin use. Tolerated ceftriaxone 03/24/20  . Sulfa Antibiotics Anaphylaxis and Hives  . Clindamycin/Lincomycin Hives    Antimicrobials this admission: vanc 11/12>> Cefepime 11/12>>  Dose adjustments this admission: n/a  Microbiology results: 11/12 BCx: sent 11/12 Resp PCR: sent  13/12, PharmD., BCPS,  BCCCP Clinical Pharmacist Please refer to [facility] for unit-specific pharmacist

## 2020-09-02 NOTE — ED Triage Notes (Signed)
Brought in by [facility], room mate called EMS  - found pt with snoring like respirations and not responsive. bgl [phone]. Known type 1 diabetic.

## 2020-09-02 NOTE — Progress Notes (Signed)
Called to room to assess patient due to snoring respirations. EMS had placed Nasal [place] and Pt on NRB at this time. SPo2 99% on NRB, RR 22. Pt is not a BIPAP candidate. CBG of 536. Awaiting MD to assess for further instructions.

## 2020-09-02 NOTE — Consult Note (Addendum)
Neurology Consult H&P  CC: found down, hx of seizures, ? Seizure activity in ED  History is obtained from: chart, ED MD  HPI: Mark [name] is a 34 y.o. male who is on [place] at home. Pt was found down by his roommate and called EMS. DKA was considered at first (and was later diagnosed on labs) but pt was extended his arms and legs in a rigid manner and seizure activity was considered. Due to his inability to protect his airway and his mental status, pt was intubated and sedated. Dr Theda Sers, neuro, ordered an EEG and MRI brain. The EEG resulted as some slowing and brief attenuation but no elepitiform activity. After the above findings, we reviewed the chart and found that his Valproic Acid is for mood disorder, not epilepsy.   ROS: Unable to assess due to altered mental status.   Past Medical History:  Diagnosis Date  . Anxiety   . Bipolar disorder ([place])   . Depression   . Diabetes mellitus without complication ([place])   . 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 Hydrocephalus ([place])   . Kidney stones   . Seizures ([place])      Family History  Problem Relation Age of Onset  . Breast cancer Mother     Social History:  reports that he has been smoking cigarettes. He has a 20.00 pack-year smoking history. He has never used smokeless tobacco. He reports current alcohol use. He reports current drug use. Frequency: 7.00 times per week. Drug: Marijuana.   Prior to Admission medications   Medication Sig Start Date End Date Taking? Authorizing Provider  insulin aspart protamine- aspart (NOVOLOG MIX 70/30) (70-30) 100 UNIT/ML injection Inject 0.35 mLs (35 Units total) into the skin 2 (two) times daily with a meal. 06/26/20  Yes Alma Friendly, MD  atorvastatin (LIPITOR) 20 MG tablet Take 1 tablet (20 mg total) by mouth daily. Patient not taking: Reported on 06/20/2020 04/12/20   Bonnielee Haff, MD  blood glucose meter kit and supplies KIT Dispense based on  patient and insurance preference. Use up to four times daily as directed. (FOR ICD-9 250.00, 250.01). Patient not taking: Reported on 06/20/2020 04/22/20   Raiford Noble Latif, DO  divalproex (DEPAKOTE) 250 MG DR tablet Take 3 tablets (750 mg total) by mouth every evening. For mood stabilization Patient not taking: Reported on 06/20/2020 04/12/20   Bonnielee Haff, MD  divalproex (DEPAKOTE) 500 MG DR tablet Take 1 tablet (500 mg total) by mouth daily. For mood stabilization Patient not taking: Reported on 06/20/2020 04/12/20   Bonnielee Haff, MD  escitalopram (LEXAPRO) 20 MG tablet Take 1 tablet (20 mg total) by mouth daily. For depression Patient not taking: Reported on 06/20/2020 04/12/20   Bonnielee Haff, MD  gabapentin (NEURONTIN) 100 MG capsule Take 100 mg by mouth 2 (two) times daily. Patient not taking: Reported on 06/20/2020    [provider]  Insulin Pen Needle (PEN NEEDLES) 31G X 5 MM MISC 1 Container by Does not apply route 4 (four) times daily -  with meals and at bedtime. 04/22/20   Raiford Noble Latif, DO  Insulin Syringes, Disposable, U-100 0.5 ML MISC For insulin 06/26/20   Alma Friendly, MD  levothyroxine (SYNTHROID) 100 MCG tablet Take 1 tablet (100 mcg total) by mouth daily before breakfast. Patient not taking: Reported on 06/20/2020 04/12/20   Bonnielee Haff, MD  QUEtiapine (SEROQUEL) 300 MG tablet Take 1 tablet (300 mg total) by mouth  at bedtime. For mood control Patient not taking: Reported on 06/20/2020 04/12/20   Bonnielee Haff, MD  insulin aspart (NOVOLOG) 100 UNIT/ML injection Inject 10 Units into the skin 3 (three) times daily with meals. Patient not taking: Reported on 03/23/2020 03/13/20 04/04/20  Antonieta Pert, MD  pregabalin (LYRICA) 150 MG capsule Take 1 capsule (150 mg total) by mouth 2 (two) times daily. Patient not taking: Reported on 03/23/2020 12/31/19 04/04/20  Damita Lack, MD    Exam: Current vital signs: BP 107/67   Pulse (!) 111   Temp 99.4 F (37.4  C) (Rectal)   Resp (!) 22   Ht 6' 2"  (1.88 m)   Wt 113 kg   SpO2 98%   BMI 31.99 kg/m   Physical Exam  Constitutional: Appears well-developed and well-nourished.  Psych:unable to perform. Eyes: No scleral injection HENT: intubated Head: Normocephalic.  Cardiovascular: Normal rate and regular rhythm.  Respiratory: iintubated and ventilated. GI: Soft.  No distension. There is no tenderness.  Skin: WDI  Neuro: Mental Status: Unable to perform neuro exam as pt is intubated and sedated.    I have reviewed the images obtained: NCT head showed persistent marked hydrocephalus with old ventriculostomy site noted without catheter.  MRI brain: EEG:   Mark [name] is a 34 y.o. male PMHx of bipolar disorder, homelessness, IDDM I, and hydrocephalus. Seizure disorder is on his PMHx list, but we believe this to be untrue.   Impression: Likely not seizures as evidenced by neg EEG. Evidence of mood disorder on [place] at home. Believe the [facility] is for mood. He has hydrocephalus on MRI, but this is stable. At one point, he likely had a shunt as the ventriculostomy shows on MRI.    Plan: Believe not to be epilepsy. Neuro will sign off. Call with questions.   Clance Boll, NP and Electronically signed by: Dr. Lynnae Sandhoff Pager: [phone] 09/02/2020, 10:03 AM    Patient seen, examined discussed plan with Clance Boll, NP. Agree with assessment and plan as documented above. I have independently reviewed the chart, obtained history, review of systems and examined the patient.  On exam the patient had been intubated and sedated, eyes midline, pupils equally reactive, oculocephalic (-), muscle tone normal, no abnormal movements, DTRs diminished, Babinski (-) bilaterally, large tattoo on left shoulder and smaller one on chest.  Potassium: 7.0 (HH) Calcium Ionized: 0.98 (L) Lactic Acid, Venous: 6.9 (HH) Beta-Hydroxybutyric Acid: 1.56 (H) Glucose: 646 (HH)  Provoked seizure Metabolic  derangement.   Continue VPA IV, correct metabolic derangement and please avoid hypocapnia.    Dr. Lynnae Sandhoff [phone]

## 2020-09-02 NOTE — Sepsis Progress Note (Signed)
Sepsis protocol being followed by eLink 

## 2020-09-02 NOTE — ED Notes (Addendum)
Patient with snoring respirations. 100% on non rebreather. Resp NP at bedside to intubate.   5465 - 2mg  versed administered IVP  0811 - 100mg  fentanyl administered IVP  0811 - 20mg  etomidate administered IVP  0812 - 100mg  rocuronium administered IVP  0813 - Patient intubated by NP 7.5 tube, 24 at lip. Positive color change, lungs auscultated. XR called for portable cxr.

## 2020-09-02 NOTE — Progress Notes (Signed)
EEG Completed; Results Pending  

## 2020-09-02 NOTE — ED Notes (Signed)
Pharmacy aware of need for insuline drip stat.

## 2020-09-02 NOTE — ED Provider Notes (Signed)
TIME SEEN: 7:00 AM  CHIEF COMPLAINT: Altered mental status  HPI: Patient is a 34 year old male with history of seizures on Depakote, type 1 diabetes with frequent episodes of DKA, hydrocephalus who presents to the emergency department with altered mental status.  Per EMS, patient's roommate called 911.  Reportedly patient was found unresponsive just prior to arrival.  Last seen normal at 3 AM.  Blood glucose in the 500s with EMS.  Patient tachycardic, tachypneic with rhonchorous breath sounds.  Currently on a nonrebreather with a right NPA in place.  No known history of trauma.  Roommate denies history of drug or alcohol abuse.  ROS: Level 5 caveat secondary to altered mental status  PAST MEDICAL HISTORY/PAST SURGICAL HISTORY:  Past Medical History:  Diagnosis Date  . Anxiety   . Bipolar disorder ([place])   . Depression   . Diabetes mellitus without complication ([place])   . Diabetic polyneuropathy associated with type 1 diabetes mellitus ([place])   . Hydrocephalus ([place])   . Kidney stones   . Seizures ([place])     MEDICATIONS:  Prior to Admission medications   Medication Sig Start Date End Date Taking? Authorizing Provider  atorvastatin (LIPITOR) 20 MG tablet Take 1 tablet (20 mg total) by mouth daily. Patient not taking: Reported on 06/20/2020 04/12/20   Bonnielee Haff, MD  blood glucose meter kit and supplies KIT Dispense based on patient and insurance preference. Use up to four times daily as directed. (FOR ICD-9 250.00, 250.01). Patient not taking: Reported on 06/20/2020 04/22/20   Raiford Noble Latif, DO  divalproex (DEPAKOTE) 250 MG DR tablet Take 3 tablets (750 mg total) by mouth every evening. For mood stabilization Patient not taking: Reported on 06/20/2020 04/12/20   Bonnielee Haff, MD  divalproex (DEPAKOTE) 500 MG DR tablet Take 1 tablet (500 mg total) by mouth daily. For mood stabilization Patient not taking: Reported on 06/20/2020 04/12/20   Bonnielee Haff, MD  escitalopram (LEXAPRO) 20 MG  tablet Take 1 tablet (20 mg total) by mouth daily. For depression Patient not taking: Reported on 06/20/2020 04/12/20   Bonnielee Haff, MD  gabapentin (NEURONTIN) 100 MG capsule Take 100 mg by mouth 2 (two) times daily. Patient not taking: Reported on 06/20/2020    [provider]  insulin aspart protamine- aspart (NOVOLOG MIX 70/30) (70-30) 100 UNIT/ML injection Inject 0.35 mLs (35 Units total) into the skin 2 (two) times daily with a meal. 06/26/20   Alma Friendly, MD  Insulin Pen Needle (PEN NEEDLES) 31G X 5 MM MISC 1 Container by Does not apply route 4 (four) times daily -  with meals and at bedtime. 04/22/20   Raiford Noble Latif, DO  Insulin Syringes, Disposable, U-100 0.5 ML MISC For insulin 06/26/20   Alma Friendly, MD  levothyroxine (SYNTHROID) 100 MCG tablet Take 1 tablet (100 mcg total) by mouth daily before breakfast. Patient not taking: Reported on 06/20/2020 04/12/20   Bonnielee Haff, MD  QUEtiapine (SEROQUEL) 300 MG tablet Take 1 tablet (300 mg total) by mouth at bedtime. For mood control Patient not taking: Reported on 06/20/2020 04/12/20   Bonnielee Haff, MD  insulin aspart (NOVOLOG) 100 UNIT/ML injection Inject 10 Units into the skin 3 (three) times daily with meals. Patient not taking: Reported on 03/23/2020 03/13/20 04/04/20  Antonieta Pert, MD  pregabalin (LYRICA) 150 MG capsule Take 1 capsule (150 mg total) by mouth 2 (two) times daily. Patient not taking: Reported on 03/23/2020 12/31/19 04/04/20  Damita Lack, MD  ALLERGIES:  Allergies  Allergen Reactions  . Mushroom Extract Complex Hives, Itching and Nausea And Vomiting  . Penicillins Anaphylaxis, Hives and Swelling    Has patient had a PCN reaction causing immediate rash, facial/tongue/throat swelling, SOB or lightheadedness with hypotension: Yes Has patient had a PCN reaction causing severe rash involving mucus membranes or skin necrosis: No Has patient had a PCN reaction that required hospitalization:  Yes Has patient had a PCN reaction occurring within the last 10 years: Yes If all of the above answers are "NO", then may proceed with Cephalosporin use. Tolerated ceftriaxone 03/24/20  . Sulfa Antibiotics Anaphylaxis and Hives  . Clindamycin/Lincomycin Hives    SOCIAL HISTORY:  Social History   Tobacco Use  . Smoking status: Current Every Day Smoker    Packs/day: 1.00    Years: 20.00    Pack years: 20.00    Types: Cigarettes  . Smokeless tobacco: Never Used  Substance Use Topics  . Alcohol use: Yes    Comment: occassional     FAMILY HISTORY: Family History  Problem Relation Age of Onset  . Breast cancer Mother     EXAM: BP 114/79   Pulse (!) 118   Temp 98.6 F (37 C) (Oral)   Resp 20   Ht 6' 2"  (1.88 m)   Wt 113 kg   SpO2 100%   BMI 31.99 kg/m  CONSTITUTIONAL: Patient will open his eyes slightly to painful stimuli and voice but does not answer questions or follow commands.  Does attempt to move both arms towards painful stimuli.  GCS currently 8.  Patient is diaphoretic. HEAD: Normocephalic, atraumatic EYES: Conjunctivae clear, pupils appear equal, EOM appear intact ENT: normal nose; moist mucous membranes; Right NPA in place. NECK: Supple, no lymphadenopathy CARD: Regular and tachycardic; S1 and S2 appreciated; no murmurs, no clicks, no rubs, no gallops RESP: Patient is tachypneic.  On nonrebreather.  No hypoxia.  Rhonchorous breath sounds diffusely.  Equal breath sounds bilaterally. ABD/GI: Normal bowel sounds; non-distended; soft BACK:  The back appears normal, no step-off or deformity, no rash or lesions present RECTAL: No gross blood or melena, normal rectal tone EXT: No edema, no joint effusion, no cyanosis SKIN: Normal color for age and race; warm; diaphoretic NEURO: GCS 8.  Moves bilateral upper extremities to painful stimuli and will open eyes slightly to painful stimuli and voice.  No asterixis.   MEDICAL DECISION MAKING: Patient here with altered  mental status.  Differential is large including DKA, sepsis, intracranial hemorrhage, electrolyte derangement, substance abuse, hepatic encephalopathy, status epilepticus, postictal state after seizure, stroke.  Patient will need admission to the hospital.  I am concerned about his respiratory status and GCS but given concern for possible DKA and he seems to be currently protecting his airway, will hold on intubation at this time.  Labs, urine, head CT, chest x-ray pending.  ED PROGRESS: Patient's labs show no significant metabolic acidosis.  Bicarb is 19 but pH is 7.2 on VBG.  He is hyperkalemic without significant EKG changes.  Will give calcium.  He is receiving IV fluids and IV insulin.  Patient does have a leukocytosis of 33,000 and given he is tachypneic and tachycardic, will give IV antibiotics for broad coverage due to the concern for possible sepsis.  Rectal temperature normal.  Chest x-ray clear.  Urine shows no sign of infection.  Head CT shows marked stable compared to previous.  Discussed with critical care.  They will see patient in the emergency department for  admission.  8:00 AM  Critical care bedside.  With suctioning patient seems to posture and has upward gaze preference.  Will give Ativan, Keppra.  Depakote level pending.  Will discuss with neurology.  8:15 AM  Pt intubated by critical care.  Gaze preference and posturing resolved after Ativan.  Discussed with neurology, Dr. Theda Sers, who will see patient in the ED.  Patient to be admitted to the ICU.  I reviewed all nursing notes and pertinent previous records as available.  I have reviewed and interpreted any EKGs, lab and urine results, imaging (as available).   EKG Interpretation  Date/Time:  Friday September 02 2020 06:03:54 EST Ventricular Rate:  116 PR Interval:    QRS Duration: 91 QT Interval:  319 QTC Calculation: 444 R Axis:   83 Text Interpretation: Sinus tachycardia Consider right atrial enlargement Anteroseptal  infarct, age indeterminate ST elevation, consider inferior injury Anterior ST elevation has improved Confirmed by Pryor Curia [phone]) on 09/02/2020 6:33:17 AM        CRITICAL CARE Performed by: Cyril Mourning Mathan Darroch   Total critical care time: 65 minutes  Critical care time was exclusive of separately billable procedures and treating other patients.  Critical care was necessary to treat or prevent imminent or life-threatening deterioration.  Critical care was time spent personally by me on the following activities: development of treatment plan with patient and/or surrogate as well as nursing, discussions with consultants, evaluation of patient's response to treatment, examination of patient, obtaining history from patient or surrogate, ordering and performing treatments and interventions, ordering and review of laboratory studies, ordering and review of radiographic studies, pulse oximetry and re-evaluation of patient's condition.   SUMMER PARTHASARATHY was evaluated in Emergency Department on 09/02/2020 for the symptoms described in the history of present illness. He was evaluated in the context of the global COVID-19 pandemic, which necessitated consideration that the patient might be at risk for infection with the SARS-CoV-2 virus that causes COVID-19. Institutional protocols and algorithms that pertain to the evaluation of patients at risk for COVID-19 are in a state of rapid change based on information released by regulatory bodies including the CDC and federal and state organizations. These policies and algorithms were followed during the patient's care in the ED.      Elsbeth Yearick, Delice Bison, DO 09/02/20 [phone]

## 2020-09-02 NOTE — H&P (Signed)
NAME:  Mark [name], MRN:  [id number], DOB:  03/23/1986, LOS: 0 ADMISSION DATE:  09/02/2020, CONSULTATION DATE: September 02, 2020 REFERRING MD: Emergency department physician CHIEF COMPLAINT: Altered mental status, uncontrolled diabetes mellitus type 1 with severe metabolic disarray  Brief History   34 year old noncompliant diabetic with altered mental status  History of present illness   34 year old male with known diabetes type 1 who is reportedly intermittently homeless and has a poor compliance with medication.  He also has a seizure disorder with no known last dose of Keppra.  He presents with altered mental status severe metabolic disarray with a potassium of 7.0 pH 7.25 venous is treated with the usual pharmaceutical regimens for DKA.  Pulmonary critical  care called to assume care and admit.  He was urgently intubated he is he was obviously aspirating the time of intubation.  Past Medical History   Past Medical History:  Diagnosis Date  . Anxiety   . Bipolar disorder ([place])   . Depression   . Diabetes mellitus without complication ([place])   . Diabetic polyneuropathy associated with type 1 diabetes mellitus ([place])   . Hydrocephalus ([place])   . Kidney stones   . Seizures ([place])      Significant Hospital Events   September 03, 2019 admit for altered mental status DKA and respiratory failure  Consults:  May 02, 2020 neurology  Procedures:  September 02, 2020 intubation urgently  Significant Diagnostic Tests:  September 02, 2020 CT of the head with hydrocephalus noted but not a new finding  Micro Data:  September 02, 2020 blood cultures x2 September 02, 2020 sputum September 02, 2020 urine  Antimicrobials:  September 02, 2020 vancomycin September 02, 2020 cefepime  Interim history/subjective:  35 year old male with poor compliance post treatment of diabetes mellitus type 1.  He presents with altered mental status potassium of 7 lactic acid of 7.7 altered mental status and  unable to protect his airway.  Objective   Blood pressure (!) 143/84, pulse (!) 119, temperature 99.4 F (37.4 C), temperature source Rectal, resp. rate 15, height _0  (1.88 m), weight 113 kg, SpO2 99 %.       No intake or output data in the 24 hours ending 09/02/20 0824 Filed Weights   09/02/20 0606  Weight: 113 kg    Examination: General: Disheveled male in obvious respiratory distress HENT: Copious oral secretions are noted no JVD is appreciated Lungs: Coarse rhonchi bilaterally Cardiovascular: Heart sounds are regular Abdomen: Soft nontender positive bowel sounds Extremities: Warm and dry without evidence of injury noted to be extremely dirty Neuro: Apparently having seizures with left-sided greater than white posturing eyes rolled back and head pupils are pinpoint   Resolved Hospital Problem list     Assessment & Plan:  Ventilator dependent respiratory failure in the setting of metabolic disarray from hyperglycemia altered mental status and inability to protect airway and suspected aspiration.  Intubated September 02, 2020 Respiratory rate set at 24 initially due to elevated lactic acid for DKA Portable chest x-ray Sputum culture Serial ABGs Empirical antimicrobial therapy Check Covid  Uncontrolled diabetes mellitus type 1 Event secondary to DKA  DKA protocol Admit to the intensive care unit   Metabolic disarray with hyperkalemia Correct metabolic acidosis\ Serial electrolyte packages Correct pH Monitor lactic acid  Seizure disorder along with bipolar disorder Keppra IV Use propofol for sedation Neurology consult has been called   Best practice:  Diet: NPO Pain/Anxiety/Delirium protocol (if indicated): Currently on fentanyl propofol VAP protocol (if  indicated): In place DVT prophylaxis: Compression stockings GI prophylaxis: PPI Glucose control: DKA protocol Mobility: Bedrest Code Status: Full Family Communication: Patient is homeless no family  roommate not available Disposition: Admit to the intensive care unit when bed available  Labs   CBC: Recent Labs  Lab 09/02/20 0605 09/02/20 0626  WBC 33.1*  --   NEUTROABS 29.2*  --   HGB 13.2 14.3  HCT 40.8 42.0  MCV 94.9  --   PLT 304  --     Basic Metabolic Panel: Recent Labs  Lab 09/02/20 0605 09/02/20 0626  NA 130* 129*  K 7.0* 7.0*  CL 95*  --   CO2 16*  --   GLUCOSE 646*  --   BUN 17  --   CREATININE 2.25*  --   CALCIUM 8.4*  --    GFR: Estimated Creatinine Clearance: 60.7 mL/min (A) (by C-G formula based on SCr of 2.25 mg/dL (H)). Recent Labs  Lab 09/02/20 0605 09/02/20 0606  WBC 33.1*  --   LATICACIDVEN  --  6.9*    Liver Function Tests: Recent Labs  Lab 09/02/20 0625  AST 364*  ALT 157*  ALKPHOS 109  BILITOT 0.8  PROT 7.3  ALBUMIN 4.3   No results for input(s): LIPASE, AMYLASE in the last 168 hours. Recent Labs  Lab 09/02/20 0625  AMMONIA 38*    ABG    Component Value Date/Time   PHART 7.35 08/23/2017 0807   PCO2ART 37 08/23/2017 0807   PO2ART 95 08/23/2017 0807   HCO3 19.0 (L) 09/02/2020 0626   TCO2 20 (L) 09/02/2020 0626   ACIDBASEDEF 8.0 (H) 09/02/2020 0626   O2SAT 99.0 09/02/2020 0626     Coagulation Profile: No results for input(s): INR, PROTIME in the last 168 hours.  Cardiac Enzymes: No results for input(s): CKTOTAL, CKMB, CKMBINDEX, TROPONINI in the last 168 hours.  HbA1C: Hemoglobin A1C  Date/Time Value Ref Range Status  10/13/2013 03:46 AM 9.4 (H) 4.2 - 6.3 % Final    Comment:    The American Diabetes Association recommends that a primary goal of therapy should be <7% and that physicians should reevaluate the treatment regimen in patients with HbA1c values consistently >8%.   10/08/2013 03:01 PM 9.7 (H) 4.2 - 6.3 % Final    Comment:    The American Diabetes Association recommends that a primary goal of therapy should be <7% and that physicians should reevaluate the treatment regimen in patients with HbA1c  values consistently >8%.    Hgb A1c MFr Bld  Date/Time Value Ref Range Status  06/22/2020 07:00 PM 8.2 (H) 4.8 - 5.6 % Final    Comment:    (NOTE) Pre diabetes:          5.7%-6.4%  Diabetes:              >6.4%  Glycemic control for   <7.0% adults with diabetes   06/21/2020 11:50 PM 8.0 (H) 4.8 - 5.6 % Final    Comment:    (NOTE)         Prediabetes: 5.7 - 6.4         Diabetes: >6.4         Glycemic control for adults with diabetes: <7.0     CBG: Recent Labs  Lab 09/02/20 0607 09/02/20 0653 09/02/20 0740 09/02/20 0822  GLUCAP 586* 474* 406* 309*    Review of Systems:   na  Past Medical History  He,  has a past medical history of  Anxiety, Bipolar disorder ([place]), Depression, Diabetes mellitus without complication ([place]), Diabetic polyneuropathy associated with type 1 diabetes mellitus ([place]), Hydrocephalus ([place]), Kidney stones, and Seizures ([place]).   Surgical History    Past Surgical History:  Procedure Laterality Date  . KIDNEY STONE SURGERY       Social History   reports that he has been smoking cigarettes. He has a 20.00 pack-year smoking history. He has never used smokeless tobacco. He reports current alcohol use. He reports current drug use. Frequency: 7.00 times per week. Drug: Marijuana.   Family History   His family history includes Breast cancer in his mother.   Allergies Allergies  Allergen Reactions  . Mushroom Extract Complex Hives, Itching and Nausea And Vomiting  . Penicillins Anaphylaxis, Hives and Swelling    Has patient had a PCN reaction causing immediate rash, facial/tongue/throat swelling, SOB or lightheadedness with hypotension: Yes Has patient had a PCN reaction causing severe rash involving mucus membranes or skin necrosis: No Has patient had a PCN reaction that required hospitalization: Yes Has patient had a PCN reaction occurring within the last 10 years: Yes If all of the above answers are "NO", then may proceed with Cephalosporin  use. Tolerated ceftriaxone 03/24/20  . Sulfa Antibiotics Anaphylaxis and Hives  . Clindamycin/Lincomycin Hives     Home Medications  Prior to Admission medications   Medication Sig Start Date End Date Taking? Authorizing Provider  atorvastatin (LIPITOR) 20 MG tablet Take 1 tablet (20 mg total) by mouth daily. Patient not taking: Reported on 06/20/2020 04/12/20   Bonnielee Haff, MD  blood glucose meter kit and supplies KIT Dispense based on patient and insurance preference. Use up to four times daily as directed. (FOR ICD-9 250.00, 250.01). Patient not taking: Reported on 06/20/2020 04/22/20   Raiford Noble Latif, DO  divalproex (DEPAKOTE) 250 MG DR tablet Take 3 tablets (750 mg total) by mouth every evening. For mood stabilization Patient not taking: Reported on 06/20/2020 04/12/20   Bonnielee Haff, MD  divalproex (DEPAKOTE) 500 MG DR tablet Take 1 tablet (500 mg total) by mouth daily. For mood stabilization Patient not taking: Reported on 06/20/2020 04/12/20   Bonnielee Haff, MD  escitalopram (LEXAPRO) 20 MG tablet Take 1 tablet (20 mg total) by mouth daily. For depression Patient not taking: Reported on 06/20/2020 04/12/20   Bonnielee Haff, MD  gabapentin (NEURONTIN) 100 MG capsule Take 100 mg by mouth 2 (two) times daily. Patient not taking: Reported on 06/20/2020    [provider]  insulin aspart protamine- aspart (NOVOLOG MIX 70/30) (70-30) 100 UNIT/ML injection Inject 0.35 mLs (35 Units total) into the skin 2 (two) times daily with a meal. 06/26/20   Alma Friendly, MD  Insulin Pen Needle (PEN NEEDLES) 31G X 5 MM MISC 1 Container by Does not apply route 4 (four) times daily -  with meals and at bedtime. 04/22/20   Raiford Noble Latif, DO  Insulin Syringes, Disposable, U-100 0.5 ML MISC For insulin 06/26/20   Alma Friendly, MD  levothyroxine (SYNTHROID) 100 MCG tablet Take 1 tablet (100 mcg total) by mouth daily before breakfast. Patient not taking: Reported on 06/20/2020 04/12/20    Bonnielee Haff, MD  QUEtiapine (SEROQUEL) 300 MG tablet Take 1 tablet (300 mg total) by mouth at bedtime. For mood control Patient not taking: Reported on 06/20/2020 04/12/20   Bonnielee Haff, MD  insulin aspart (NOVOLOG) 100 UNIT/ML injection Inject 10 Units into the skin 3 (three) times daily with meals. Patient not taking: Reported  on 03/23/2020 03/13/20 04/04/20  Antonieta Pert, MD  pregabalin (LYRICA) 150 MG capsule Take 1 capsule (150 mg total) by mouth 2 (two) times daily. Patient not taking: Reported on 03/23/2020 12/31/19 04/04/20  Damita Lack, MD     Critical care time: 32 min  Richardson Landry Zaniyah Wernette ACNP Acute Care Nurse Practitioner [place] Please consult Amion 09/02/2020, 8:24 AM

## 2020-09-02 NOTE — Progress Notes (Signed)
Pharmacy Antibiotic Note  Mark [name] is a 34 y.o. male admitted on 09/02/2020 with sepsis.  Pharmacy has been consulted for vancomycin and aztreonam dosing.  Noted pcn allergy, pt has tolerated multiple cephalosporins in past and will proceed with cefepime.    Plan: Vancomycin 2000 mg Iv x 1, then variable dosing due to unstable renal function (target vancomycin trough 15-20) Cefepime 2g IV every 24 hours Monitor renal function, Cx and clinical progression to narrow Vancomycin trough at steady state  Height: 6\' 2"  (188 cm) Weight: 113 kg (249 lb 1.9 oz) IBW/kg (Calculated) : 82.2  Temp (24hrs), Avg:99 F (37.2 C), Min:98.6 F (37 C), Max:99.4 F (37.4 C)  Recent Labs  Lab 09/02/20 0605  WBC 33.1*  CREATININE 2.25*    Estimated Creatinine Clearance: 60.7 mL/min (A) (by C-G formula based on SCr of 2.25 mg/dL (H)).    Allergies  Allergen Reactions  . Mushroom Extract Complex Hives, Itching and Nausea And Vomiting  . Penicillins Anaphylaxis, Hives and Swelling    Has patient had a PCN reaction causing immediate rash, facial/tongue/throat swelling, SOB or lightheadedness with hypotension: Yes Has patient had a PCN reaction causing severe rash involving mucus membranes or skin necrosis: No Has patient had a PCN reaction that required hospitalization: Yes Has patient had a PCN reaction occurring within the last 10 years: Yes If all of the above answers are "NO", then may proceed with Cephalosporin use. Tolerated ceftriaxone 03/24/20  . Sulfa Antibiotics Anaphylaxis and Hives  . Clindamycin/Lincomycin Hives    Antimicrobials this admission: vanc 11/12>> Cefepime 11/12>>  Dose adjustments this admission: n/a  Microbiology results: 11/12 BCx: sent 11/12 Resp PCR: sent  13/12, PharmD Clinical Pharmacist ED Pharmacist Phone # [phone] 09/02/2020 7:37 AM

## 2020-09-02 NOTE — ED Notes (Signed)
ED Provider at bedside at this time.  

## 2020-09-02 NOTE — ED Notes (Addendum)
Light green, dark green, blue, purple, gold and grey (on ice) sent to lab by [facility] with pts stickers.

## 2020-09-02 NOTE — Procedures (Signed)
Patient Name: MANOJ ENRIQUEZ  MRN: [id number]  Epilepsy Attending: Charlsie Quest  Referring Physician/Provider: Dr. Levon Hedger Date: 09/02/2017  Duration: 24.08 minutes  Patient history: 34 year old male with history of seizures who presented after possible seizure and in DKA.  EEG to evaluate for seizures.  Level of alertness: comatose  AEDs during EEG study: Keppra, Ativan, Versed  Technical aspects: This EEG study was done with scalp electrodes positioned according to the 10-20 International system of electrode placement. Electrical activity was acquired at a sampling rate of 500Hz  and reviewed with a high frequency filter of 70Hz  and a low frequency filter of 1Hz . EEG data were recorded continuously and digitally stored.   Description: EEG showed continuous generalized polymorphic mixed frequencies with predominantly 6 to 9 Hz theta-alpha activity as well as intermittent generalized 2 to 3 Hz delta activity.  Brief 1 to 2-second periods of generalized EEG attenuation were also noted.  Hyperventilation and photic stimulation were not performed.     ABNORMALITY -Continuous slow, generalized  IMPRESSION: This study is suggestive of severe diffuse encephalopathy, nonspecific etiology.  No seizures or definite epileptiform discharges were seen throughout the recording.  Dafna Romo 

## 2020-09-03 DIAGNOSIS — J988 Other specified respiratory disorders: Secondary | ICD-10-CM

## 2020-09-03 DIAGNOSIS — J690 Pneumonitis due to inhalation of food and vomit: Principal | ICD-10-CM

## 2020-09-03 DIAGNOSIS — E1010 Type 1 diabetes mellitus with ketoacidosis without coma: Secondary | ICD-10-CM

## 2020-09-03 LAB — POCT I-STAT 7, (LYTES, BLD GAS, ICA,H+H)
Acid-Base Excess: 2 mmol/L (ref 0.0–2.0)
Acid-Base Excess: 2 mmol/L (ref 0.0–2.0)
Bicarbonate: 24.3 mmol/L (ref 20.0–28.0)
Bicarbonate: 25.6 mmol/L (ref 20.0–28.0)
Calcium, Ion: 1.24 mmol/L (ref 1.15–1.40)
Calcium, Ion: 1.29 mmol/L (ref 1.15–1.40)
HCT: 30 % — ABNORMAL LOW (ref 39.0–52.0)
HCT: 27 % — ABNORMAL LOW (ref 39.0–52.0)
Hemoglobin: 10.2 g/dL — ABNORMAL LOW (ref 13.0–17.0)
Hemoglobin: 9.2 g/dL — ABNORMAL LOW (ref 13.0–17.0)
O2 Saturation: 96 %
O2 Saturation: 97 %
Patient temperature: 99.7
Patient temperature: 100.2
Potassium: 3.3 mmol/L — ABNORMAL LOW (ref 3.5–5.1)
Potassium: 3.3 mmol/L — ABNORMAL LOW (ref 3.5–5.1)
Sodium: 142 mmol/L (ref 135–145)
Sodium: 143 mmol/L (ref 135–145)
TCO2: 25 mmol/L (ref 22–32)
TCO2: 27 mmol/L (ref 22–32)
pCO2 arterial: 28.7 mmHg — ABNORMAL LOW (ref 32.0–48.0)
pCO2 arterial: 37.1 mmHg (ref 32.0–48.0)
pH, Arterial: 7.537 — ABNORMAL HIGH (ref 7.350–7.450)
pH, Arterial: 7.45 (ref 7.350–7.450)
pO2, Arterial: 71 mmHg — ABNORMAL LOW (ref 83.0–108.0)
pO2, Arterial: 86 mmHg (ref 83.0–108.0)

## 2020-09-03 LAB — GLUCOSE, CAPILLARY
Glucose-Capillary: 100 mg/dL — ABNORMAL HIGH (ref 70–99)
Glucose-Capillary: 123 mg/dL — ABNORMAL HIGH (ref 70–99)
Glucose-Capillary: 148 mg/dL — ABNORMAL HIGH (ref 70–99)
Glucose-Capillary: 152 mg/dL — ABNORMAL HIGH (ref 70–99)
Glucose-Capillary: 158 mg/dL — ABNORMAL HIGH (ref 70–99)
Glucose-Capillary: 163 mg/dL — ABNORMAL HIGH (ref 70–99)
Glucose-Capillary: 164 mg/dL — ABNORMAL HIGH (ref 70–99)
Glucose-Capillary: 179 mg/dL — ABNORMAL HIGH (ref 70–99)
Glucose-Capillary: 180 mg/dL — ABNORMAL HIGH (ref 70–99)
Glucose-Capillary: 180 mg/dL — ABNORMAL HIGH (ref 70–99)
Glucose-Capillary: 184 mg/dL — ABNORMAL HIGH (ref 70–99)
Glucose-Capillary: 190 mg/dL — ABNORMAL HIGH (ref 70–99)
Glucose-Capillary: 195 mg/dL — ABNORMAL HIGH (ref 70–99)

## 2020-09-03 LAB — CBC
HCT: 30.2 % — ABNORMAL LOW (ref 39.0–52.0)
Hemoglobin: 10.2 g/dL — ABNORMAL LOW (ref 13.0–17.0)
MCH: 30.4 pg (ref 26.0–34.0)
MCHC: 33.8 g/dL (ref 30.0–36.0)
MCV: 89.9 fL (ref 80.0–100.0)
Platelets: 182 10*3/uL (ref 150–400)
RBC: 3.36 MIL/uL — ABNORMAL LOW (ref 4.22–5.81)
RDW: 13.1 % (ref 11.5–15.5)
WBC: 15.9 10*3/uL — ABNORMAL HIGH (ref 4.0–10.5)
nRBC: 0 % (ref 0.0–0.2)

## 2020-09-03 LAB — BASIC METABOLIC PANEL
Anion gap: 7 (ref 5–15)
BUN: 12 mg/dL (ref 6–20)
CO2: 22 mmol/L (ref 22–32)
Calcium: 8.7 mg/dL — ABNORMAL LOW (ref 8.9–10.3)
Chloride: 109 mmol/L (ref 98–111)
Creatinine, Ser: 1.09 mg/dL (ref 0.61–1.24)
GFR, Estimated: >60 mL/min (ref >60)
Glucose, Bld: 159 mg/dL — ABNORMAL HIGH (ref 70–99)
Potassium: 3.3 mmol/L — ABNORMAL LOW (ref 3.5–5.1)
Sodium: 138 mmol/L (ref 135–145)

## 2020-09-03 LAB — TRIGLYCERIDES: Triglycerides: 53 mg/dL (ref <150)

## 2020-09-03 LAB — URINE CULTURE: Culture: NO GROWTH

## 2020-09-03 LAB — MAGNESIUM
Magnesium: 1.4 mg/dL — ABNORMAL LOW (ref 1.7–2.4)
Magnesium: 1.5 mg/dL — ABNORMAL LOW (ref 1.7–2.4)

## 2020-09-03 LAB — PHOSPHORUS
Phosphorus: 1.1 mg/dL — ABNORMAL LOW (ref 2.5–4.6)
Phosphorus: 1.7 mg/dL — ABNORMAL LOW (ref 2.5–4.6)

## 2020-09-03 LAB — TSH: TSH: 14.416 u[IU]/mL — ABNORMAL HIGH (ref 0.350–4.500)

## 2020-09-03 MED ORDER — ATORVASTATIN CALCIUM 10 MG PO TABS
20.0000 mg | ORAL_TABLET | Freq: Every day | ORAL | Status: DC
  Administered 2020-09-03: 20 mg
  Filled 2020-09-03: qty 2

## 2020-09-03 MED ORDER — DOCUSATE SODIUM 50 MG/5ML PO LIQD: 100.0000 mg | Freq: Two times a day (BID) | ORAL | Status: DC | PRN

## 2020-09-03 MED ORDER — PANTOPRAZOLE SODIUM 40 MG PO PACK
40.0000 mg | PACK | ORAL | Status: DC
  Administered 2020-09-03: 40 mg
  Filled 2020-09-03: qty 20

## 2020-09-03 MED ORDER — LEVOTHYROXINE SODIUM 50 MCG PO TABS
50.0000 ug | ORAL_TABLET | Freq: Every day | ORAL | Status: DC
  Administered 2020-09-04: 50 ug
  Filled 2020-09-03: qty 1

## 2020-09-03 MED ORDER — PROSOURCE TF PO LIQD
45.0000 mL | Freq: Two times a day (BID) | ORAL | Status: DC
  Administered 2020-09-03 (×2): 45 mL
  Filled 2020-09-03 (×2): qty 45

## 2020-09-03 MED ORDER — INSULIN ASPART 100 UNIT/ML ~~LOC~~ SOLN
2.0000 [IU] | SUBCUTANEOUS | Status: DC
  Administered 2020-09-03 – 2020-09-04 (×5): 2 [IU] via SUBCUTANEOUS

## 2020-09-03 MED ORDER — VITAL HIGH PROTEIN PO LIQD
1000.0000 mL | ORAL | Status: DC
  Administered 2020-09-03: 1000 mL

## 2020-09-03 MED ORDER — ENOXAPARIN SODIUM 40 MG/0.4ML ~~LOC~~ SOLN
40.0000 mg | SUBCUTANEOUS | Status: DC
  Administered 2020-09-03 – 2020-09-11 (×9): 40 mg via SUBCUTANEOUS
  Filled 2020-09-03 (×9): qty 0.4

## 2020-09-03 MED ORDER — POTASSIUM CHLORIDE 10 MEQ/100ML IV SOLN
10.0000 meq | INTRAVENOUS | Status: AC
  Administered 2020-09-03 (×4): 10 meq via INTRAVENOUS
  Filled 2020-09-03 (×4): qty 100

## 2020-09-03 MED ORDER — INSULIN ASPART 100 UNIT/ML ~~LOC~~ SOLN
3.0000 [IU] | SUBCUTANEOUS | Status: DC
  Administered 2020-09-03: 6 [IU] via SUBCUTANEOUS
  Administered 2020-09-03: 3 [IU] via SUBCUTANEOUS
  Administered 2020-09-04 (×2): 6 [IU] via SUBCUTANEOUS

## 2020-09-03 MED ORDER — SODIUM CHLORIDE 0.9 % IV SOLN
2.0000 g | INTRAVENOUS | Status: AC
  Administered 2020-09-03 – 2020-09-06 (×4): 2 g via INTRAVENOUS
  Filled 2020-09-03 (×4): qty 2

## 2020-09-03 MED ORDER — DEXTROSE 10 % IV SOLN: INTRAVENOUS | Status: DC | PRN

## 2020-09-03 MED ORDER — INSULIN DETEMIR 100 UNIT/ML ~~LOC~~ SOLN
8.0000 [IU] | Freq: Two times a day (BID) | SUBCUTANEOUS | Status: DC
  Administered 2020-09-03 (×2): 8 [IU] via SUBCUTANEOUS
  Filled 2020-09-03 (×4): qty 0.08

## 2020-09-03 MED ORDER — POTASSIUM CHLORIDE 20 MEQ/15ML (10%) PO SOLN
20.0000 meq | ORAL | Status: AC
  Administered 2020-09-03 (×2): 20 meq
  Filled 2020-09-03 (×2): qty 15

## 2020-09-03 MED ORDER — ATORVASTATIN CALCIUM 10 MG PO TABS: 20.0000 mg | ORAL_TABLET | Freq: Every day | ORAL | Status: DC

## 2020-09-03 NOTE — Progress Notes (Signed)
Pharmacy Electrolyte Replacement  Recent Labs:  Recent Labs    09/03/20 0631 09/03/20 0631 09/03/20 0736  K 3.3*   < > 3.3*  CREATININE 1.09  --   --    < > = values in this interval not displayed.    Low Critical Values (K </= 2.5, Phos </= 1, Mg </= 1) Present: None  MD Contacted: n/a - no critical values noted  Plan: Replace KCl PT q4h x 2 doses + K runs x 4 Recheck K with AM labs per protocol   Leia Alf, PharmD, BCPS Please check AMION for all [facility] contact numbers Clinical Pharmacist 09/03/2020 7:47 AM

## 2020-09-03 NOTE — Procedures (Signed)
Intubation Procedure Note NYGEL PROKOP [id number] 1985/10/28  Procedure: Intubation Indications: Airway protection and maintenance Performed procedure  om 09/02/20 0745 Procedure Details Consent: Unable to obtain consent because of emergent medical necessity. Time Out: Verified patient identification, verified procedure, site/side was marked, verified correct patient position, special equipment/implants available, medications/allergies/relevent history reviewed, required imaging and test results available.  Performed  MAC and 4 Medications:  Fentanyl  Etomidate Versed NMB    Evaluation Hemodynamic Status: BP stable throughout; O2 sats: transiently fell during during procedure Patient's Current Condition: stable Complications: No apparent complications Patient did tolerate procedure well. Chest X-ray ordered to verify placement.  CXR: tube position acceptable.   Richardson Landry Ziyan Schoon ACNP [facility] PCCM Pager [phone] till 3 pm If no answer page [phone] 09/03/2020, 7:01 AM

## 2020-09-03 NOTE — Progress Notes (Signed)
RT obtained ABG with the following results. RT will continue to monitor.   Results for TRAN, ARZUAGA (MRN [id number]) as of 09/03/2020 05:07  Ref. Range 09/03/2020 05:03  Sample type Unknown ARTERIAL  pH, Arterial Latest Ref Range: 7.35 - 7.45  7.537 (H)  pCO2 arterial Latest Ref Range: 32 - 48 mmHg 28.7 (L)  pO2, Arterial Latest Ref Range: 83 - 108 mmHg 71 (L)  TCO2 Latest Ref Range: 22 - 32 mmol/L 25  Acid-Base Excess Latest Ref Range: 0.0 - 2.0 mmol/L 2.0  Bicarbonate Latest Ref Range: 20.0 - 28.0 mmol/L 24.3  O2 Saturation Latest Units: % 96.0  Patient temperature Unknown 99.7 F  Collection site Unknown Radial

## 2020-09-03 NOTE — Progress Notes (Signed)
NAME:  Mark [name], MRN:  [id number], DOB:  1985/10/24, LOS: 1 ADMISSION DATE:  09/02/2020, CONSULTATION DATE: September 02, 2020 REFERRING MD: Dr. Elesa Massed, ER CHIEF COMPLAINT: Altered mental status  Brief History   34 yo male smoker brought to ER with altered mental status and sonorous respirations from DKA.  Required intubation in setting of compromised airway with concern for aspiration.  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  Needed FiO2 increased overnight.  Objective   Blood pressure 129/83, pulse (!) 113, temperature (!) 100.4 F (38 C), temperature source Axillary, resp. rate 18, height 6\' 2"  (1.88 m), weight 104.5 kg, SpO2 98 %.    Vent Mode: PRVC FiO2 (%):  [30 %-50 %] 50 % Set Rate:  [18 bmp-24 bmp] 18 bmp Vt Set:  [650 mL] 650 mL PEEP:  [5 cmH20] 5 cmH20 Plateau Pressure:  [18 cmH20-24 cmH20] 18 cmH20   Intake/Output Summary (Last 24 hours) at 09/03/2020 0853 Last data filed at 09/03/2020 0600 Gross per 24 hour  Intake 3382.98 ml  Output 2200 ml  Net 1182.98 ml   Filed Weights   09/02/20 0606 09/02/20 1200 09/03/20 0500  Weight: 113 kg 103.6 kg 104.5 kg    Examination:  General - sedated Eyes - pupils reactive ENT - ETT in place Cardiac - regular, tachycardic Chest - b/l crackles Abdomen - soft, non tender, + bowel sounds Extremities - no cyanosis, clubbing, or edema Skin - no rashes Neuro - RASS -1,  follows simple commands   Resolved Hospital Problem list     Assessment & Plan:   Acute hypoxic respiratory failure with compromised airway and concern for aspiration pneumonitis. - continue vent support - goal SpO2 > 92% - f/u CXR - day 2 of ABx, change to rocephin  DKA with poorly controlled DM type 1. - blood sugars better, and anion gap closed - transition to SSI with levemir  Acute metabolic encephalopathy from DKA. Chronic hydrocephalus. Hx of Bipolar, depression, anxiety. - seen by neurology 11/12 and didn't feel patient had seizure; also hydrocephalus is chronic and didn't warrant intervention at this time - listed as being on depakote, lexapro, neurontin, seroquel as outpt but doesn't appear he was taking these  Hypokalemia. - replace as needed  Anemia of critical illness and chronic disease. - f/u CBC - transfuse for Hb < 7 or significant bleeding  Homeless. - consult social worker   Hx of HLD. - resume lipitor  Hx of hypothyroidism. - doesn't appear he was taking synthroid as outpt anymore - check TSH  Best practice:  Diet: tube feeds DVT prophylaxis: lovenox GI prophylaxis: protonix Mobility: Bedrest Code Status: Full Disposition: ICU  Labs    CMP Latest Ref Rng & Units 09/03/2020 09/03/2020 09/03/2020  Glucose 70 - 99 mg/dL - 09/05/2020) -  BUN 6 - 20 mg/dL - 12 -  Creatinine 865(H - 1.24 mg/dL - 8.46 -  Sodium 9.62 - 145 mmol/L 143 138 142  Potassium 3.5 - 5.1 mmol/L 3.3(L)  3.3(L) 3.3(L)  Chloride 98 - 111 mmol/L - 109 -  CO2 22 - 32 mmol/L - 22 -  Calcium 8.9 - 10.3 mg/dL - 8.7(L) -  Total Protein 6.5 - 8.1 g/dL - - -  Total Bilirubin 0.3 - 1.2 mg/dL - - -  Alkaline Phos 38 - 126 U/L - - -  AST 15 - 41 U/L - - -  ALT 0 - 44 U/L - - -    CBC Latest Ref Rng & Units 09/03/2020 09/03/2020 09/03/2020  WBC 4.0 - 10.5 K/uL - 15.9(H) -  Hemoglobin 13.0 - 17.0 g/dL 8.9(F) 10.2(L) 10.2(L)  Hematocrit 39 - 52 % 27.0(L) 30.2(L) 30.0(L)  Platelets 150  - 400 K/uL - 182 -    ABG    Component Value Date/Time   PHART 7.450 09/03/2020 0736   PCO2ART 37.1 09/03/2020 0736   PO2ART 86 09/03/2020 0736   HCO3 25.6 09/03/2020 0736   TCO2 27 09/03/2020 0736   ACIDBASEDEF 3.0 (H) 09/02/2020 0913   O2SAT 97.0 09/03/2020 0736    Lab Results  Component Value Date   HGBA1C 8.2 (H) 06/22/2020    Lab Results  Component Value Date   TSH 18.433 (H) 03/05/2020    Critical care time: 39 minutes  Coralyn Helling, MD [facility] Pulmonary/Critical Care Pager - [phone] 09/03/2020, 9:13 AM

## 2020-09-03 NOTE — Progress Notes (Signed)
eLink Physician-Brief Progress Note Patient Name: Mark [name] DOB: December 16, 1985 MRN: [id number]   Date of Service  09/03/2020  HPI/Events of Note  ABG on 30%/PRVC 650/TV 650/P 5 = 7.537/28.7/71.   eICU Interventions  Plan: 1. Decrease PRVC rate to 18. 2. Repeat ABG at 7:30 AM.     Intervention Category Major Interventions: Respiratory failure - evaluation and management;Acid-Base disturbance - evaluation and management  Jae Bruck Eugene 09/03/2020, 5:33 AM

## 2020-09-04 ENCOUNTER — Inpatient Hospital Stay (HOSPITAL_COMMUNITY): Payer: Medicaid Other

## 2020-09-04 DIAGNOSIS — E1011 Type 1 diabetes mellitus with ketoacidosis with coma: Secondary | ICD-10-CM

## 2020-09-04 LAB — BASIC METABOLIC PANEL
Anion gap: 6 (ref 5–15)
BUN: 8 mg/dL (ref 6–20)
CO2: 26 mmol/L (ref 22–32)
Calcium: 8.8 mg/dL — ABNORMAL LOW (ref 8.9–10.3)
Chloride: 107 mmol/L (ref 98–111)
Creatinine, Ser: 0.89 mg/dL (ref 0.61–1.24)
GFR, Estimated: >60 mL/min (ref >60)
Glucose, Bld: 177 mg/dL — ABNORMAL HIGH (ref 70–99)
Potassium: 3.6 mmol/L (ref 3.5–5.1)
Sodium: 139 mmol/L (ref 135–145)

## 2020-09-04 LAB — CBC
HCT: 32.5 % — ABNORMAL LOW (ref 39.0–52.0)
Hemoglobin: 10.8 g/dL — ABNORMAL LOW (ref 13.0–17.0)
MCH: 31.2 pg (ref 26.0–34.0)
MCHC: 33.2 g/dL (ref 30.0–36.0)
MCV: 93.9 fL (ref 80.0–100.0)
Platelets: 189 10*3/uL (ref 150–400)
RBC: 3.46 MIL/uL — ABNORMAL LOW (ref 4.22–5.81)
RDW: 13.2 % (ref 11.5–15.5)
WBC: 13.7 10*3/uL — ABNORMAL HIGH (ref 4.0–10.5)
nRBC: 0 % (ref 0.0–0.2)

## 2020-09-04 LAB — GLUCOSE, CAPILLARY
Glucose-Capillary: 179 mg/dL — ABNORMAL HIGH (ref 70–99)
Glucose-Capillary: 255 mg/dL — ABNORMAL HIGH (ref 70–99)
Glucose-Capillary: 284 mg/dL — ABNORMAL HIGH (ref 70–99)
Glucose-Capillary: 247 mg/dL — ABNORMAL HIGH (ref 70–99)
Glucose-Capillary: 209 mg/dL — ABNORMAL HIGH (ref 70–99)
Glucose-Capillary: 231 mg/dL — ABNORMAL HIGH (ref 70–99)

## 2020-09-04 LAB — MAGNESIUM: Magnesium: 1.5 mg/dL — ABNORMAL LOW (ref 1.7–2.4)

## 2020-09-04 LAB — LEGIONELLA PNEUMOPHILA SEROGP 1 UR AG: L. pneumophila Serogp 1 Ur Ag: NEGATIVE

## 2020-09-04 LAB — PHOSPHORUS: Phosphorus: 1.9 mg/dL — ABNORMAL LOW (ref 2.5–4.6)

## 2020-09-04 MED ORDER — POLYETHYLENE GLYCOL 3350 17 G PO PACK: 17.0000 g | PACK | Freq: Every day | ORAL | Status: DC | PRN

## 2020-09-04 MED ORDER — ACETAMINOPHEN 325 MG PO TABS
650.0000 mg | ORAL_TABLET | Freq: Four times a day (QID) | ORAL | Status: DC | PRN
  Administered 2020-09-04 – 2020-09-06 (×3): 650 mg
  Filled 2020-09-04 (×3): qty 2

## 2020-09-04 MED ORDER — MIDAZOLAM HCL 2 MG/2ML IJ SOLN
INTRAMUSCULAR | Status: AC
  Filled 2020-09-04: qty 2

## 2020-09-04 MED ORDER — PANTOPRAZOLE SODIUM 40 MG PO TBEC: 40.0000 mg | DELAYED_RELEASE_TABLET | Freq: Every day | ORAL | Status: DC

## 2020-09-04 MED ORDER — DEXMEDETOMIDINE HCL IN NACL 400 MCG/100ML IV SOLN: 0.0000 ug/kg/h | INTRAVENOUS | Status: DC

## 2020-09-04 MED ORDER — MIDAZOLAM HCL 2 MG/2ML IJ SOLN
2.0000 mg | INTRAMUSCULAR | Status: DC | PRN
  Administered 2020-09-04 (×3): 2 mg via INTRAVENOUS
  Filled 2020-09-04 (×2): qty 2

## 2020-09-04 MED ORDER — POLYETHYLENE GLYCOL 3350 17 G PO PACK: 17.0000 g | PACK | Freq: Every day | ORAL | Status: DC

## 2020-09-04 MED ORDER — PANTOPRAZOLE SODIUM 40 MG PO PACK
40.0000 mg | PACK | Freq: Every day | ORAL | Status: DC
  Administered 2020-09-04 – 2020-09-09 (×4): 40 mg
  Filled 2020-09-04 (×5): qty 20

## 2020-09-04 MED ORDER — FENTANYL 2500MCG IN NS 250ML (10MCG/ML) PREMIX INFUSION
50.0000 ug/h | INTRAVENOUS | Status: DC
  Administered 2020-09-04 – 2020-09-05 (×2): 100 ug/h via INTRAVENOUS
  Administered 2020-09-05 – 2020-09-06 (×2): 150 ug/h via INTRAVENOUS
  Filled 2020-09-04 (×4): qty 250

## 2020-09-04 MED ORDER — VITAL HIGH PROTEIN PO LIQD
1000.0000 mL | ORAL | Status: DC
  Administered 2020-09-04: 1000 mL

## 2020-09-04 MED ORDER — POTASSIUM PHOSPHATES 15 MMOLE/5ML IV SOLN
30.0000 mmol | Freq: Once | INTRAVENOUS | Status: AC
  Administered 2020-09-04: 30 mmol via INTRAVENOUS
  Filled 2020-09-04: qty 10

## 2020-09-04 MED ORDER — POLYETHYLENE GLYCOL 3350 17 G PO PACK
17.0000 g | PACK | Freq: Every day | ORAL | Status: DC
  Administered 2020-09-04: 17 g
  Filled 2020-09-04: qty 1

## 2020-09-04 MED ORDER — MIDAZOLAM HCL 2 MG/2ML IJ SOLN
2.0000 mg | INTRAMUSCULAR | Status: DC | PRN
  Administered 2020-09-05 – 2020-09-06 (×7): 2 mg via INTRAVENOUS
  Filled 2020-09-04 (×7): qty 2

## 2020-09-04 MED ORDER — ATORVASTATIN CALCIUM 10 MG PO TABS
20.0000 mg | ORAL_TABLET | Freq: Every day | ORAL | Status: DC
  Administered 2020-09-04 – 2020-09-09 (×4): 20 mg
  Filled 2020-09-04 (×7): qty 2

## 2020-09-04 MED ORDER — DOCUSATE SODIUM 50 MG/5ML PO LIQD: 100.0000 mg | Freq: Every day | ORAL | Status: DC | PRN

## 2020-09-04 MED ORDER — INSULIN ASPART 100 UNIT/ML ~~LOC~~ SOLN
0.0000 [IU] | SUBCUTANEOUS | Status: DC
  Administered 2020-09-04 (×2): 5 [IU] via SUBCUTANEOUS
  Administered 2020-09-04 (×2): 8 [IU] via SUBCUTANEOUS
  Administered 2020-09-04: 5 [IU] via SUBCUTANEOUS
  Administered 2020-09-05: 3 [IU] via SUBCUTANEOUS
  Administered 2020-09-05: 8 [IU] via SUBCUTANEOUS
  Administered 2020-09-05 – 2020-09-06 (×4): 5 [IU] via SUBCUTANEOUS
  Administered 2020-09-06: 11 [IU] via SUBCUTANEOUS
  Administered 2020-09-06 (×2): 3 [IU] via SUBCUTANEOUS
  Administered 2020-09-06: 5 [IU] via SUBCUTANEOUS
  Administered 2020-09-06: 3 [IU] via SUBCUTANEOUS
  Administered 2020-09-07: 5 [IU] via SUBCUTANEOUS
  Administered 2020-09-07: 8 [IU] via SUBCUTANEOUS
  Administered 2020-09-07: 11 [IU] via SUBCUTANEOUS
  Administered 2020-09-07: 3 [IU] via SUBCUTANEOUS
  Administered 2020-09-08 (×5): 5 [IU] via SUBCUTANEOUS
  Administered 2020-09-08: 8 [IU] via SUBCUTANEOUS
  Administered 2020-09-09: 11 [IU] via SUBCUTANEOUS
  Administered 2020-09-09: 5 [IU] via SUBCUTANEOUS
  Administered 2020-09-09: 3 [IU] via SUBCUTANEOUS

## 2020-09-04 MED ORDER — FENTANYL BOLUS VIA INFUSION
50.0000 ug | INTRAVENOUS | Status: DC | PRN
  Filled 2020-09-04: qty 50

## 2020-09-04 MED ORDER — INSULIN DETEMIR 100 UNIT/ML ~~LOC~~ SOLN
8.0000 [IU] | Freq: Two times a day (BID) | SUBCUTANEOUS | Status: DC
  Administered 2020-09-04 – 2020-09-09 (×11): 8 [IU] via SUBCUTANEOUS
  Filled 2020-09-04 (×13): qty 0.08

## 2020-09-04 MED ORDER — MAGNESIUM SULFATE 4 GM/100ML IV SOLN
4.0000 g | Freq: Once | INTRAVENOUS | Status: AC
  Administered 2020-09-04: 4 g via INTRAVENOUS
  Filled 2020-09-04: qty 100

## 2020-09-04 MED ORDER — LEVOTHYROXINE SODIUM 50 MCG PO TABS: 50.0000 ug | ORAL_TABLET | Freq: Every day | ORAL | Status: DC

## 2020-09-04 MED ORDER — VITAL 1.5 CAL PO LIQD
1000.0000 mL | ORAL | Status: DC
  Administered 2020-09-05 – 2020-09-06 (×2): 1000 mL
  Filled 2020-09-04 (×3): qty 1000

## 2020-09-04 MED ORDER — LEVOTHYROXINE SODIUM 50 MCG PO TABS
50.0000 ug | ORAL_TABLET | Freq: Every day | ORAL | Status: DC
  Administered 2020-09-05 – 2020-09-10 (×4): 50 ug
  Filled 2020-09-04 (×4): qty 1

## 2020-09-04 MED ORDER — FENTANYL CITRATE (PF) 100 MCG/2ML IJ SOLN
50.0000 ug | INTRAMUSCULAR | Status: DC | PRN
  Administered 2020-09-04 (×2): 200 ug via INTRAVENOUS
  Administered 2020-09-04: 100 ug via INTRAVENOUS
  Administered 2020-09-04: 200 ug via INTRAVENOUS
  Filled 2020-09-04: qty 4
  Filled 2020-09-04: qty 2
  Filled 2020-09-04 (×2): qty 4

## 2020-09-04 MED ORDER — PROSOURCE TF PO LIQD
45.0000 mL | Freq: Two times a day (BID) | ORAL | Status: DC
  Administered 2020-09-04 – 2020-09-06 (×5): 45 mL
  Filled 2020-09-04 (×5): qty 45

## 2020-09-04 MED ORDER — ATORVASTATIN CALCIUM 10 MG PO TABS: 20.0000 mg | ORAL_TABLET | Freq: Every day | ORAL | Status: DC

## 2020-09-04 NOTE — Progress Notes (Addendum)
NAME:  Mark [name], MRN:  [id number], DOB:  01-15-1986, LOS: 2 ADMISSION DATE:  09/02/2020, CONSULTATION DATE: September 02, 2020 REFERRING MD: Dr. Elesa Massed, ER CHIEF COMPLAINT: Altered mental status  Brief History   34 yo male smoker brought to ER with altered mental status and sonorous respirations from DKA.  Required intubation in setting of compromised airway with concern for aspiration.  Past Medical History  DM type 1, Anxiety, Bipolar, Depression, DM neuropathy, Nephrolithiasis, Seizures, Hydrocephalus  Significant Hospital Events   11/12 Admit 11/13 off insulin gtt 11/14 extubated  Consults:  Neurology s/o 11/12  Procedures:  ETT 11/12 >> 11/14  Significant Diagnostic Tests:  CT head >> marked hydrocephalus, aqueduct stenosis, Rt frontal ventriculostomy site w/o drain  Micro Data:  COVID/Flu 11/12 >> negative MRSA PCR 11/12 >> negative Blood 11/12 >> Urine 11/12 >> negative  Antimicrobials:  Vancomycin 11/12 Cefepime 11/12 Rocephin 11/13 >>   Interim history/subjective:  Tolerating pressure support.  Objective   Blood pressure (!) 148/80, pulse 85, temperature 99.4 F (37.4 C), temperature source Oral, resp. rate 16, height 6\' 2"  (1.88 m), weight 104.7 kg, SpO2 100 %.    Vent Mode: PRVC FiO2 (%):  [30 %-50 %] 40 % Set Rate:  [16 bmp] 16 bmp Vt Set:  [650 mL] 650 mL PEEP:  [5 cmH20] 5 cmH20 Plateau Pressure:  [13 cmH20-17 cmH20] 17 cmH20   Intake/Output Summary (Last 24 hours) at 09/04/2020 0755 Last data filed at 09/04/2020 0600 Gross per 24 hour  Intake 3061.2 ml  Output 1225 ml  Net 1836.2 ml   Filed Weights   09/02/20 1200 09/03/20 0500 09/04/20 0500  Weight: 103.6 kg 104.5 kg 104.7 kg    Examination:  General - alert Eyes - pupils reactive ENT - ETT in place Cardiac - regular rate/rhythm, no murmur Chest - equal breath sounds b/l, no wheezing or rales Abdomen - soft, non tender, + bowel sounds Extremities - no cyanosis, clubbing, or  edema Skin - no rashes Neuro - moves extremities, follows commands  Resolved Hospital Problem list   Acute metabolic encephalopathy from DKA  Assessment & Plan:   Acute hypoxic respiratory failure with compromised airway and aspiration pneumonitis (present on admission). - proceed with extubation 11/14 - goal SpO2 > 92% - day 3/5 of ABx, currently on rocephin  DKA with poorly controlled DM type 1. - SSI with levemir - advance to CHO modified diet as able after extubation  Chronic hydrocephalus. Hx of Bipolar, depression, anxiety. - seen by neurology 11/12 and didn't feel patient had seizure; also hydrocephalus is chronic and didn't warrant intervention at this time - listed as being on depakote, lexapro, neurontin, seroquel as outpt but doesn't appear he was taking these - monitor mental status after extubation  Hypokalemia, hypomagnesemia, hypophosphatemia. - replace as needed  Anemia of critical illness and chronic disease. - f/u CBC intermittently - transfuse for Hb < 7 or significant bleeding  Homeless. - transition of care team consult placed  Hx of HLD. - continue lipitor  Hx of hypothyroidism. - continue synthroid  Deconditioning. - PT/OT  Best practice:  Diet: NPO DVT prophylaxis: lovenox GI prophylaxis: protonix Mobility: OOB to chair Code Status: Full Disposition: ICU  Labs    CMP Latest Ref Rng & Units 09/04/2020 09/03/2020 09/03/2020  Glucose 70 - 99 mg/dL 09/05/2020) - 364(W)  BUN 6 - 20 mg/dL 8 - 12  Creatinine 803(O - 1.24 mg/dL 1.22 - 4.82  Sodium 5.00 - 145 mmol/L 139  143 138  Potassium 3.5 - 5.1 mmol/L 3.6 3.3(L) 3.3(L)  Chloride 98 - 111 mmol/L 107 - 109  CO2 22 - 32 mmol/L 26 - 22  Calcium 8.9 - 10.3 mg/dL 2.6(O) - 8.7(L)  Total Protein 6.5 - 8.1 g/dL - - -  Total Bilirubin 0.3 - 1.2 mg/dL - - -  Alkaline Phos 38 - 126 U/L - - -  AST 15 - 41 U/L - - -  ALT 0 - 44 U/L - - -    CBC Latest Ref Rng & Units 09/04/2020 09/03/2020 09/03/2020    WBC 4.0 - 10.5 K/uL 13.7(H) - 15.9(H)  Hemoglobin 13.0 - 17.0 g/dL 10.8(L) 9.2(L) 10.2(L)  Hematocrit 39 - 52 % 32.5(L) 27.0(L) 30.2(L)  Platelets 150 - 400 K/uL 189 - 182    ABG    Component Value Date/Time   PHART 7.450 09/03/2020 0736   PCO2ART 37.1 09/03/2020 0736   PO2ART 86 09/03/2020 0736   HCO3 25.6 09/03/2020 0736   TCO2 27 09/03/2020 0736   ACIDBASEDEF 3.0 (H) 09/02/2020 0913   O2SAT 97.0 09/03/2020 0736    Lab Results  Component Value Date   HGBA1C 8.2 (H) 06/22/2020    Lab Results  Component Value Date   TSH 14.416 (H) 09/02/2020    Critical care time: 32 minutes  Coralyn Helling, MD [facility] Pulmonary/Critical Care Pager - [phone] 09/04/2020, 7:55 AM

## 2020-09-04 NOTE — Progress Notes (Signed)
Initial Nutrition Assessment  DOCUMENTATION CODES:   Not applicable  INTERVENTION:  Monitor magnesium, potassium, and phosphorus daily for at least 3 days, MD to replete as needed, as pt is at risk for refeeding syndrome.  Via OGT: -Transition to Vital 1.5 cal @ 58ml/hr, advance 41ml/hr Q4H until goal rate of 30ml/hr is reached -65ml Prosource TF BID  At goal, TF regimen will provide 2420 kcals, 127 grams protein, free water   NUTRITION DIAGNOSIS:   Inadequate oral intake related to inability to eat as evidenced by NPO status.    GOAL:   Patient will meet greater than or equal to 90% of their needs    MONITOR:   Vent status, Weight trends, Labs, TF tolerance, I & O's  REASON FOR ASSESSMENT:   Consult Enteral/tube feeding initiation and management  ASSESSMENT:   Pt admitted with AMS and sonorous respirations from DKA. Pt required intubation in the setting of compromised airway with concern for aspiration. PMH includes type 1 DM, anxiety/depression, bipolar disorder, nephrolithiasis, seizures, hydrocephalus. Pt is reportedly intermittently homeless.  11/12 intubated; OG placed (tip in stomach per xray) 11/13 off insulin gtt  Per CCM, attempted extubation today but pt developed progressive agitation and confusion, tachycardia, and then hypoxia. Unable to extubate at this time. Pt was initiated on TF protocol yesterday (Vital High Protein @ 65ml/hr with 41ml Prosource TF BID); RD will adjust TF regimen to better meet pt's needs.   Patient is currently intubated on ventilator support MV: 13.2 L/min Temp (24hrs), Avg:99.2 F (37.3 C), Min:97.7 F (36.5 C), Max:99.9 F (37.7 C)  UOP: x24 hours  Labs: Phosphorus 1.9 (L), Magnesium 1.5 (L), CBGs 179-255 Medications: ss novolog, 8 units levemir BID, protonix, miralax, IV 4g magnesium sulfate x1, IV potassium phosphate x1 IVF: D5 in LR @ 59ml/hr  Diet Order:   Diet Order            Diet NPO time  specified  Diet effective now                 EDUCATION NEEDS:   Not appropriate for education at this time  Skin:  Skin Assessment: Reviewed RN Assessment  Last BM:  PTA  Height:   Ht Readings from Last 1 Encounters:  09/02/20 6\' 2"  (1.88 m)    Weight:   Wt Readings from Last 1 Encounters:  09/04/20 104.7 kg    BMI:  Body mass index is 29.64 kg/m.  Estimated Nutritional Needs:   Kcal:  2460  Protein:  125-145 grams  Fluid:  >2L    2461, MS, RD, LDN RD pager number and weekend/on-call pager number located in Amion.

## 2020-09-04 NOTE — Progress Notes (Signed)
Pharmacy Electrolyte Replacement  Recent Labs:  Recent Labs    09/04/20 0226  K 3.6  MG 1.5*  PHOS 1.9*  CREATININE 0.89    Low Critical Values (K </= 2.5, Phos </= 1, Mg </= 1) Present: None  MD Contacted: n/a - no critical values noted  Plan: KPhos IV x 1 Mag sulfate 4g IV x 1 Recheck lytes with AM labs per protocol   Leia Alf, PharmD, BCPS Please check AMION for all [facility] contact numbers Clinical Pharmacist 09/04/2020 7:40 AM

## 2020-09-04 NOTE — Progress Notes (Signed)
Extubation orders placed.  Patient remained on pressure support and off sedation.  Developed progressive agitation and confusion.  Developed tachycardia and then hypoxia.  Not able to extubate at this point.  Reordered vent, sedation, tube feeds.  Coralyn Helling, MD [facility] Pulmonary/Critical Care Pager - [phone] 09/04/2020, 8:52 AM

## 2020-09-05 ENCOUNTER — Inpatient Hospital Stay (HOSPITAL_COMMUNITY): Payer: Medicaid Other

## 2020-09-05 LAB — GLUCOSE, CAPILLARY
Glucose-Capillary: 219 mg/dL — ABNORMAL HIGH (ref 70–99)
Glucose-Capillary: 273 mg/dL — ABNORMAL HIGH (ref 70–99)
Glucose-Capillary: 244 mg/dL — ABNORMAL HIGH (ref 70–99)
Glucose-Capillary: 214 mg/dL — ABNORMAL HIGH (ref 70–99)
Glucose-Capillary: 151 mg/dL — ABNORMAL HIGH (ref 70–99)
Glucose-Capillary: 212 mg/dL — ABNORMAL HIGH (ref 70–99)

## 2020-09-05 LAB — BASIC METABOLIC PANEL
Anion gap: 8 (ref 5–15)
BUN: 9 mg/dL (ref 6–20)
CO2: 23 mmol/L (ref 22–32)
Calcium: 8.4 mg/dL — ABNORMAL LOW (ref 8.9–10.3)
Chloride: 106 mmol/L (ref 98–111)
Creatinine, Ser: 0.78 mg/dL (ref 0.61–1.24)
GFR, Estimated: >60 mL/min (ref >60)
Glucose, Bld: 246 mg/dL — ABNORMAL HIGH (ref 70–99)
Potassium: 3.5 mmol/L (ref 3.5–5.1)
Sodium: 137 mmol/L (ref 135–145)

## 2020-09-05 LAB — MAGNESIUM: Magnesium: 1.7 mg/dL (ref 1.7–2.4)

## 2020-09-05 LAB — PHOSPHORUS: Phosphorus: 2.3 mg/dL — ABNORMAL LOW (ref 2.5–4.6)

## 2020-09-05 MED ORDER — DOCUSATE SODIUM 50 MG/5ML PO LIQD
100.0000 mg | Freq: Every day | ORAL | Status: DC | PRN
  Administered 2020-09-06: 100 mg
  Filled 2020-09-05: qty 10

## 2020-09-05 MED ORDER — POLYETHYLENE GLYCOL 3350 17 G PO PACK
17.0000 g | PACK | Freq: Every day | ORAL | Status: DC | PRN
  Administered 2020-09-06: 17 g
  Filled 2020-09-05: qty 1

## 2020-09-05 MED ORDER — FUROSEMIDE 10 MG/ML IJ SOLN
40.0000 mg | Freq: Once | INTRAMUSCULAR | Status: AC
  Administered 2020-09-05: 40 mg via INTRAVENOUS
  Filled 2020-09-05: qty 4

## 2020-09-05 MED ORDER — INSULIN ASPART 100 UNIT/ML ~~LOC~~ SOLN
4.0000 [IU] | SUBCUTANEOUS | Status: DC
  Administered 2020-09-05 – 2020-09-09 (×21): 4 [IU] via SUBCUTANEOUS

## 2020-09-05 MED ORDER — POLYETHYLENE GLYCOL 3350 17 G PO PACK: 17.0000 g | PACK | Freq: Every day | ORAL | Status: DC | PRN

## 2020-09-05 NOTE — Plan of Care (Signed)
TF

## 2020-09-05 NOTE — Progress Notes (Signed)
NAME:  Mark [name], MRN:  [id number], DOB:  June 02, 1986, LOS: 3 ADMISSION DATE:  09/02/2020, CONSULTATION DATE: September 02, 2020 REFERRING MD: Dr. Elesa Massed, ER CHIEF COMPLAINT: Altered mental status  Brief History   34 yo male smoker brought to ER with altered mental status and sonorous respirations from DKA.  Required intubation in setting of compromised airway with concern for aspiration.  Past Medical History  DM type 1, Anxiety, Bipolar, Depression, DM neuropathy, Nephrolithiasis, Seizures, Hydrocephalus  Significant Hospital Events   11/12 Admit 11/13 off insulin gtt 11/14 extubated  Consults:  Neurology s/o 11/12  Procedures:  ETT 11/12 >> 11/14  Significant Diagnostic Tests:  CT head >> marked hydrocephalus, aqueduct stenosis, Rt frontal ventriculostomy site w/o drain  Micro Data:  COVID/Flu 11/12 >> negative MRSA PCR 11/12 >> negative Blood 11/12 >> Urine 11/12 >> negative  Antimicrobials:  Vancomycin 11/12 Cefepime 11/12 Rocephin 11/13 >>   Interim history/subjective:  Failed SBT towards end of trial yesterday due to agitation and hypoxia. Placed back on full support.   Objective   Blood pressure (!) 143/73, pulse 96, temperature 100.1 F (37.8 C), temperature source Axillary, resp. rate 14, height 6\' 2"  (1.88 m), weight 105.8 kg, SpO2 96 %.    Vent Mode: PRVC FiO2 (%):  [40 %-100 %] 40 % Set Rate:  [16 bmp] 16 bmp Vt Set:  [650 mL] 650 mL PEEP:  [5 cmH20] 5 cmH20 Pressure Support:  [10 cmH20] 10 cmH20 Plateau Pressure:  [17 cmH20-22 cmH20] 17 cmH20   Intake/Output Summary (Last 24 hours) at 09/05/2020 0849 Last data filed at 09/05/2020 0800 Gross per 24 hour  Intake 2435.24 ml  Output 1900 ml  Net 535.24 ml   Filed Weights   09/03/20 0500 09/04/20 0500 09/05/20 0500  Weight: 104.5 kg 104.7 kg 105.8 kg    Examination:  General - somnolent.  Eyes - pupils reactive ENT - ETT in place Cardiac - regular rate/rhythm, no murmur Chest - equal  breath sounds b/l, no wheezing or rales Abdomen - soft, non tender, + bowel sounds Extremities - no cyanosis, clubbing, or edema Skin - no rashes Neuro - moves extremities, follows commands  Resolved Hospital Problem list   Acute metabolic encephalopathy from DKA  Assessment & Plan:   Acute hypoxic respiratory failure with compromised airway and aspiration pneumonitis (present on admission). - Continue with daily SBT. - Trial of diuresis today to facilitate extubation.  - day 3/5 of ABx, currently on rocephin  DKA with poorly controlled DM type 1. - SSI with levemir - advance to CHO modified diet as able after extubation  Chronic hydrocephalus. Hx of Bipolar, depression, anxiety. - seen by neurology 11/12 and didn't feel patient had seizure; also hydrocephalus is chronic and didn't warrant intervention at this time - listed as being on depakote, lexapro, neurontin, seroquel as outpt but doesn't appear he was taking these - monitor mental status after extubation  Hypokalemia, hypomagnesemia, hypophosphatemia. - replace as needed  Anemia of critical illness and chronic disease. - f/u CBC intermittently - transfuse for Hb < 7 or significant bleeding  Homeless. - transition of care team consult placed  Hx of HLD. - continue lipitor  Hx of hypothyroidism. - continue synthroid  Deconditioning. - PT/OT  Best practice:  Diet: NPO DVT prophylaxis: lovenox GI prophylaxis: protonix Mobility: OOB to chair Code Status: Full Disposition: ICU  Labs    CMP Latest Ref Rng & Units 09/05/2020 09/04/2020 09/03/2020  Glucose 70 - 99 mg/dL 09/05/2020)  177(H) -  BUN 6 - 20 mg/dL 9 8 -  Creatinine 8.03 - 1.24 mg/dL 2.12 2.48 -  Sodium 250 - 145 mmol/L 137 139 143  Potassium 3.5 - 5.1 mmol/L 3.5 3.6 3.3(L)  Chloride 98 - 111 mmol/L 106 107 -  CO2 22 - 32 mmol/L 23 26 -  Calcium 8.9 - 10.3 mg/dL 0.3(B) 0.4(U) -  Total Protein 6.5 - 8.1 g/dL - - -  Total Bilirubin 0.3 - 1.2 mg/dL - -  -  Alkaline Phos 38 - 126 U/L - - -  AST 15 - 41 U/L - - -  ALT 0 - 44 U/L - - -    CBC Latest Ref Rng & Units 09/04/2020 09/03/2020 09/03/2020  WBC 4.0 - 10.5 K/uL 13.7(H) - 15.9(H)  Hemoglobin 13.0 - 17.0 g/dL 10.8(L) 9.2(L) 10.2(L)  Hematocrit 39 - 52 % 32.5(L) 27.0(L) 30.2(L)  Platelets 150 - 400 K/uL 189 - 182    ABG    Component Value Date/Time   PHART 7.450 09/03/2020 0736   PCO2ART 37.1 09/03/2020 0736   PO2ART 86 09/03/2020 0736   HCO3 25.6 09/03/2020 0736   TCO2 27 09/03/2020 0736   ACIDBASEDEF 3.0 (H) 09/02/2020 0913   O2SAT 97.0 09/03/2020 0736    Lab Results  Component Value Date   HGBA1C 8.2 (H) 06/22/2020    Lab Results  Component Value Date   TSH 14.416 (H) 09/02/2020    CRITICAL CARE Performed by: Lynnell Catalan   Total critical care time: 40 minutes  Critical care time was exclusive of separately billable procedures and treating other patients.  Critical care was necessary to treat or prevent imminent or life-threatening deterioration.  Critical care was time spent personally by me on the following activities: development of treatment plan with patient and/or surrogate as well as nursing, discussions with consultants, eavluation of patient's response to treatment, examination of patient, obtaining history from patient or surrogate, ordering and performing treatments and interventions, ordering and review of laboratory studies, ordering and review of radiographic studies, pulse oximetry, re-evaluation of patient's condition and participation in multidisciplinary rounds.  Lynnell Catalan, MD [facility] ICU Physician [facility] Critical Care  Pager: [phone] Mobile: [phone] After hours: [phone].

## 2020-09-05 NOTE — Progress Notes (Signed)
Inpatient Diabetes Program Recommendations  AACE/ADA: New Consensus Statement on Inpatient Glycemic Control (2015)  Target Ranges:  Prepandial:   less than 140 mg/dL      Peak postprandial:   less than 180 mg/dL (1-2 hours)      Critically ill patients:  140 - 180 mg/dL   Lab Results  Component Value Date   GLUCAP 273 (H) 09/05/2020   HGBA1C 8.2 (H) 06/22/2020    Review of Glycemic Control Results for WIL, SLAPE (MRN [id number]) as of 09/05/2020 08:09  Ref. Range 09/04/2020 20:12 09/04/2020 23:26 09/05/2020 03:48 09/05/2020 08:05  Glucose-Capillary Latest Ref Range: 70 - 99 mg/dL 935 (H) 701 (H) 779 (H) 273 (H)   Diabetes history: Type 1 DM Outpatient Diabetes medications: Novolog 70/30 35 units BID Current orders for Inpatient glycemic control: Novolog 0-15 units Q4H, Levemir 8 units BID Vital @ 65 ml/hr  Inpatient Diabetes Program Recommendations:    If patient to remain on tube feeds, consider adding Novolog 4 units Q4H (to be stopped or held in the event tube feeds are held).   Thanks, Lujean Rave, MSN, RNC-OB Diabetes Coordinator [phone] (8a-5p)

## 2020-09-06 LAB — CBC
HCT: 29.1 % — ABNORMAL LOW (ref 39.0–52.0)
Hemoglobin: 9.7 g/dL — ABNORMAL LOW (ref 13.0–17.0)
MCH: 30.9 pg (ref 26.0–34.0)
MCHC: 33.3 g/dL (ref 30.0–36.0)
MCV: 92.7 fL (ref 80.0–100.0)
Platelets: 190 10*3/uL (ref 150–400)
RBC: 3.14 MIL/uL — ABNORMAL LOW (ref 4.22–5.81)
RDW: 12.8 % (ref 11.5–15.5)
WBC: 9.5 10*3/uL (ref 4.0–10.5)
nRBC: 0 % (ref 0.0–0.2)

## 2020-09-06 LAB — BASIC METABOLIC PANEL
Anion gap: 9 (ref 5–15)
Anion gap: 12 (ref 5–15)
BUN: 7 mg/dL (ref 6–20)
BUN: 8 mg/dL (ref 6–20)
CO2: 26 mmol/L (ref 22–32)
CO2: 27 mmol/L (ref 22–32)
Calcium: 8.6 mg/dL — ABNORMAL LOW (ref 8.9–10.3)
Calcium: 9.1 mg/dL (ref 8.9–10.3)
Chloride: 107 mmol/L (ref 98–111)
Chloride: 102 mmol/L (ref 98–111)
Creatinine, Ser: 0.83 mg/dL (ref 0.61–1.24)
Creatinine, Ser: 0.85 mg/dL (ref 0.61–1.24)
GFR, Estimated: >60 mL/min (ref >60)
GFR, Estimated: >60 mL/min (ref >60)
Glucose, Bld: 221 mg/dL — ABNORMAL HIGH (ref 70–99)
Glucose, Bld: 299 mg/dL — ABNORMAL HIGH (ref 70–99)
Potassium: 3.3 mmol/L — ABNORMAL LOW (ref 3.5–5.1)
Potassium: 3.9 mmol/L (ref 3.5–5.1)
Sodium: 142 mmol/L (ref 135–145)
Sodium: 141 mmol/L (ref 135–145)

## 2020-09-06 LAB — GLUCOSE, CAPILLARY
Glucose-Capillary: 178 mg/dL — ABNORMAL HIGH (ref 70–99)
Glucose-Capillary: 224 mg/dL — ABNORMAL HIGH (ref 70–99)
Glucose-Capillary: 301 mg/dL — ABNORMAL HIGH (ref 70–99)
Glucose-Capillary: 188 mg/dL — ABNORMAL HIGH (ref 70–99)
Glucose-Capillary: 156 mg/dL — ABNORMAL HIGH (ref 70–99)
Glucose-Capillary: 333 mg/dL — ABNORMAL HIGH (ref 70–99)

## 2020-09-06 MED ORDER — FUROSEMIDE 10 MG/ML IJ SOLN
40.0000 mg | Freq: Once | INTRAMUSCULAR | Status: AC
  Administered 2020-09-06: 40 mg via INTRAVENOUS
  Filled 2020-09-06: qty 4

## 2020-09-06 MED ORDER — POTASSIUM CHLORIDE 20 MEQ/15ML (10%) PO SOLN
20.0000 meq | ORAL | Status: AC
  Administered 2020-09-06 (×2): 20 meq
  Filled 2020-09-06 (×2): qty 15

## 2020-09-06 MED ORDER — POTASSIUM CHLORIDE 10 MEQ/100ML IV SOLN
10.0000 meq | INTRAVENOUS | Status: AC
  Administered 2020-09-06 (×4): 10 meq via INTRAVENOUS
  Filled 2020-09-06 (×4): qty 100

## 2020-09-06 NOTE — Progress Notes (Signed)
[facility] ADULT ICU REPLACEMENT PROTOCOL   The patient does apply for the [facility] Adult ICU Electrolyte Replacment Protocol based on the criteria listed below:   1. Is GFR >/= 30 ml/min? Yes.    Patient's GFR today is >60 2. Is SCr </= 2? Yes.   Patient's SCr is 0.83 ml/kg/hr 3. Did SCr increase >/= 0.5 in 24 hours? No. 4. Abnormal electrolyte(s): k 3.3 5. Ordered repletion with: protocol 6. If a panic level lab has been reported, has the CCM MD in charge been notified? No..   Physician:    Markus Daft A 09/06/2020 5:25 AM

## 2020-09-06 NOTE — Progress Notes (Signed)
Inpatient Diabetes Program Recommendations  AACE/ADA: New Consensus Statement on Inpatient Glycemic Control (2015)  Target Ranges:  Prepandial:   less than 140 mg/dL      Peak postprandial:   less than 180 mg/dL (1-2 hours)      Critically ill patients:  140 - 180 mg/dL   Lab Results  Component Value Date   GLUCAP 301 (H) 09/06/2020   HGBA1C 8.2 (H) 06/22/2020    Review of Glycemic Control  224, 301 mg/dL  Current orders for Inpatient glycemic control: Levemir 8 units bid, Novolog 0-15 units Q4H + 4 units Q4H for TF coverage  Inpatient Diabetes Program Recommendations:     Increase Levemir to 10 units bid.  Will likely need more TF coverage, but recommend changing only one insulin at a time  Follow daily.  Thank you. Ailene Ards, RD, LDN, CDE Inpatient Diabetes Coordinator [phone]

## 2020-09-06 NOTE — Progress Notes (Signed)
Pt with copious secretions. This RT has changed the HME x4 this am. Pt placed on full support settings due to increased secretions/desaturations. RT will continue to monitor.

## 2020-09-06 NOTE — Progress Notes (Signed)
Pt vent alarming when RT entered pt room. Pt manipulated tube out of airway/self extubated while wearing mittens. Pt not getting volumes/MVe. No stridor noted, bilat BS clear/diminished. Pt able to vocalize Full name and phonate appropriately. Pt placed on [place] 4 Lpm w/humidity. Elink MD notified of self extubation. RT will continue to monitor.

## 2020-09-06 NOTE — Progress Notes (Addendum)
NAME:  Mark [name], MRN:  [id number], DOB:  [birth year], LOS: 4 ADMISSION DATE:  09/02/2020, CONSULTATION DATE: September 02, 2020 REFERRING MD: Dr. Elesa Massed, ER CHIEF COMPLAINT: Altered mental status  Brief History   34 yo male smoker brought to ER with altered mental status and sonorous respirations from DKA.  Required intubation in setting of compromised airway with concern for aspiration.  Past Medical History  DM type 1, Anxiety, Bipolar, Depression, DM neuropathy, Nephrolithiasis, Seizures, Hydrocephalus  Significant Hospital Events   11/12 Admit 11/13 off insulin gtt 11/14 extubated  Consults:  Neurology s/o 11/12  Procedures:  ETT 11/12 >> 11/14  Significant Diagnostic Tests:  CT head >> marked hydrocephalus, aqueduct stenosis, Rt frontal ventriculostomy site w/o drain  Micro Data:  COVID/Flu 11/12 >> negative MRSA PCR 11/12 >> negative Blood 11/12 >> Urine 11/12 >> negative  Antimicrobials:  Vancomycin 11/12 Cefepime 11/12 Rocephin 11/13 >>   Interim history/subjective:  Failed SBT towards end of trial yesterday due to agitation and hypoxia. Placed back on full support.   Objective   Blood pressure 139/85, pulse (!) 109, temperature 99.7 F (37.6 C), temperature source Axillary, resp. rate 17, height 6\' 2"  (1.88 m), weight 103.2 kg, SpO2 96 %.    Vent Mode: PRVC FiO2 (%):  [30 %-40 %] 30 % Set Rate:  [16 bmp] 16 bmp Vt Set:  [650 mL] 650 mL PEEP:  [5 cmH20] 5 cmH20 Pressure Support:  [5 cmH20] 5 cmH20 Plateau Pressure:  [16 cmH20-21 cmH20] 21 cmH20   Intake/Output Summary (Last 24 hours) at 09/06/2020 1008 Last data filed at 09/06/2020 1000 Gross per 24 hour  Intake 3887.15 ml  Output 2825 ml  Net 1062.15 ml   Filed Weights   09/04/20 0500 09/05/20 0500 09/06/20 0500  Weight: 104.7 kg 105.8 kg 103.2 kg    Examination:  General: Well-nourished well-developed disheveled male HEENT: Endotracheal and orogastric tube are in place Neuro:  Intermittently follows commands moves all extremities. CV: Heart sounds are regular.  Is a sinus tachycardia with agitated PULM:   Vent pressure regulated volume control FIO2 30% PEEP 5 RATE 16 VT 650  GI: soft, bsx4 active  GU: Amber urine Extremities: warm/dry, 1+ edema  Skin: no rashes or lesions   Resolved Hospital Problem list   Acute metabolic encephalopathy from DKA  Assessment & Plan:   Acute hypoxic respiratory failure with compromised airway and aspiration pneumonitis (present on admission). Day 4 of antibiotic microbial therapy Diuresed We will attempt extubation today his mental status makes extubation difficult at this time. Repeat lasix 11/16   DKA with poorly controlled DM type 1. Sliding-scale insulin protocol  Chronic hydrocephalus. Hx of Bipolar, depression, anxiety. Neurology has been evaluated is also noted he has chronic hydrocephalus.  Note he is awake and follows commands at this time Antiseizure medications ordered Further evaluation of mental status post extubation    Hypokalemia, hypomagnesemia, hypophosphatemia. Recent Labs  Lab 09/04/20 0226 09/05/20 0657 09/06/20 0352  K 3.6 3.5 3.3*    Monitor replete  Anemia of critical illness and chronic disease. Recent Labs    09/04/20 0226 09/06/20 0352  HGB 10.8* 9.7*    Transfuse per protocol  Homeless. Transition care team consulted  Hx of HLD. Continue Lipitor  Hx of hypothyroidism. Currently on Synthroid TSH 14.416  Deconditioning. PT OT when available  Best practice:  Diet: NPO DVT prophylaxis: lovenox GI prophylaxis: protonix Mobility: as tolerated Code Status: Full Disposition: ICU  Labs    CMP  Latest Ref Rng & Units 09/06/2020 09/05/2020 09/04/2020  Glucose 70 - 99 mg/dL 315(V) 761(Y) 073(X)  BUN 6 - 20 mg/dL 7 9 8   Creatinine 0.61 - 1.24 mg/dL 1.06 2.69  Sodium 135 - 145 mmol/L 142 137 139  Potassium 3.5 - 5.1 mmol/L 3.3(L) 3.5 3.6  Chloride 98 -  111 mmol/L 107 106 107  CO2 22 - 32 mmol/L 26 23 26   Calcium 8.9 - 10.3 mg/dL 4.85) ) 4.6(E)  Total Protein 6.5 - 8.1 g/dL - - -  Total Bilirubin 0.3 - 1.2 mg/dL - - -  Alkaline Phos 38 - 126 U/L - - -  AST 15 - 41 U/L - - -  ALT 0 - 44 U/L - - -    CBC Latest Ref Rng & Units 09/06/2020 09/04/2020 09/03/2020  WBC 4.0 - 10.5 K/uL 9.5 13.7(H) -  Hemoglobin 13.0 - 17.0 g/dL 09/06/2020) 10.8(L) 9.2(L)  Hematocrit 39 - 52 % 29.1(L) 32.5(L) 27.0(L)  Platelets 150 - 400 K/uL 190 189 -    ABG    Component Value Date/Time   PHART 7.450 09/03/2020 0736   PCO2ART 37.1 09/03/2020 0736   PO2ART 86 09/03/2020 0736   HCO3 25.6 09/03/2020 0736   TCO2 27 09/03/2020 0736   ACIDBASEDEF 3.0 (H) 09/02/2020 0913   O2SAT 97.0 09/03/2020 0736    Lab Results  Component Value Date   HGBA1C 8.2 (H) 06/22/2020    Lab Results  Component Value Date   TSH 14.416 (H) 09/02/2020    CRITICAL CARE Performed by:  08/22/2020 Dartanyan Deasis ACNP Acute Care Nurse Practitioner 13/09/2020 Pulmonary/Critical Care Please consult Amion 09/06/2020, 10:08 AM App cct 34 min

## 2020-09-06 NOTE — Progress Notes (Signed)
eLink Physician-Brief Progress Note Patient Name: STAFFORD RIVIERA DOB: [birth year] MRN: [id number]   Date of Service  09/06/2020  HPI/Events of Note  Notified that patient inadvertently self extubated.  Patient seen responsive. Fentanyl, which was running at 200 mcg/hr now turned off. Seems to be protecting airway. Maintaining adequate sats. Good gag reflex on bedside assessment.  eICU Interventions  Will monitor for now. ABG and possible re-intubation if with clinical decline. Discussed with bedside RN and RT. In house CCM team made aware.     Intervention Category Major Interventions: Airway management  Darl Pikes 09/06/2020, 7:49 PM

## 2020-09-07 ENCOUNTER — Inpatient Hospital Stay (HOSPITAL_COMMUNITY): Payer: Medicaid Other

## 2020-09-07 LAB — CULTURE, BLOOD (ROUTINE X 2)
Culture: NO GROWTH
Culture: NO GROWTH
Culture: NO GROWTH
Culture: NO GROWTH
Special Requests: ADEQUATE
Special Requests: ADEQUATE
Special Requests: ADEQUATE

## 2020-09-07 LAB — BASIC METABOLIC PANEL
Anion gap: 9 (ref 5–15)
BUN: 8 mg/dL (ref 6–20)
CO2: 27 mmol/L (ref 22–32)
Calcium: 9.2 mg/dL (ref 8.9–10.3)
Chloride: 108 mmol/L (ref 98–111)
Creatinine, Ser: 0.79 mg/dL (ref 0.61–1.24)
GFR, Estimated: >60 mL/min (ref >60)
Glucose, Bld: 223 mg/dL — ABNORMAL HIGH (ref 70–99)
Potassium: 3.7 mmol/L (ref 3.5–5.1)
Sodium: 144 mmol/L (ref 135–145)

## 2020-09-07 LAB — GLUCOSE, CAPILLARY
Glucose-Capillary: 67 mg/dL — ABNORMAL LOW (ref 70–99)
Glucose-Capillary: 147 mg/dL — ABNORMAL HIGH (ref 70–99)
Glucose-Capillary: 266 mg/dL — ABNORMAL HIGH (ref 70–99)
Glucose-Capillary: 183 mg/dL — ABNORMAL HIGH (ref 70–99)
Glucose-Capillary: 221 mg/dL — ABNORMAL HIGH (ref 70–99)
Glucose-Capillary: 117 mg/dL — ABNORMAL HIGH (ref 70–99)
Glucose-Capillary: 193 mg/dL — ABNORMAL HIGH (ref 70–99)

## 2020-09-07 LAB — MAGNESIUM: Magnesium: 1.7 mg/dL (ref 1.7–2.4)

## 2020-09-07 LAB — PHOSPHORUS: Phosphorus: 2.7 mg/dL (ref 2.5–4.6)

## 2020-09-07 NOTE — Progress Notes (Signed)
NAME:  Mark [name], MRN:  [id number], DOB:  [birth year], LOS: 5 ADMISSION DATE:  09/02/2020, CONSULTATION DATE: September 02, 2020 REFERRING MD: Dr. Elesa Massed, ER CHIEF COMPLAINT: Altered mental status  Brief History   34 yo male smoker brought to ER with altered mental status and sonorous respirations from DKA.  Required intubation in setting of compromised airway with concern for aspiration.  Past Medical History  DM type 1, Anxiety, Bipolar, Depression, DM neuropathy, Nephrolithiasis, Seizures, Hydrocephalus  Significant Hospital Events   11/12 Admit 11/13 off insulin gtt 11/14 extubated  Consults:  Neurology s/o 11/12  Procedures:  ETT 11/12 >> 11/14  Significant Diagnostic Tests:  CT head >> marked hydrocephalus, aqueduct stenosis, Rt frontal ventriculostomy site w/o drain  Micro Data:  COVID/Flu 11/12 >> negative MRSA PCR 11/12 >> negative Blood 11/12 >> Urine 11/12 >> negative  Antimicrobials:  Vancomycin 11/12 Cefepime 11/12 Rocephin 11/13 >>   Interim history/subjective:  Successful self extubation last evening at 7pm.  Remains weak but no other voiced complaint. Failed swallow evaluation.    Objective   Blood pressure (!) 151/95, pulse 97, temperature 99.2 F (37.3 C), temperature source Oral, resp. rate 14, height 6\' 2"  (1.88 m), weight 103 kg, SpO2 98 %.    Vent Mode: PRVC FiO2 (%):  [30 %] 30 % Set Rate:  [16 bmp] 16 bmp Vt Set:  [650 mL] 650 mL PEEP:  [5 cmH20] 5 cmH20   Intake/Output Summary (Last 24 hours) at 09/07/2020 0926 Last data filed at 09/07/2020 0900 Gross per 24 hour  Intake 2164.29 ml  Output 3500 ml  Net -1335.71 ml   Filed Weights   09/05/20 0500 09/06/20 0500 09/07/20 0500  Weight: 105.8 kg 103.2 kg 103 kg    Examination:  General: Well-nourished well-developed disheveled male HEENT: Raspy voice but no dysarthria. Neuro: awake and following commands. Generalized weakness with no focal deficits.  CV: Heart sounds are normal,  no edema, no JVD.09/09/20  PULM: no respiratory distress. Occasional rhonchi which cleared with deep breathing.  GI: soft, bsx4 active  GU: Amber urine Extremities: warm/dry, 1+ edema  Skin: no rashes or lesions   Resolved Hospital Problem list   Acute metabolic encephalopathy from DKA  Assessment & Plan:   Acute hypoxic respiratory failure with compromised airway and aspiration pneumonitis (present on admission). Tolerated extubation, now on room air. Progressive ambulation Ready for transfer.  DKA with poorly controlled DM type 1. Sliding-scale insulin protocol Will need to reintroduce long-acting insulin once cleared to swallow.    Chronic hydrocephalus. Hx of Bipolar, depression, anxiety. Neurology has been evaluated is also noted he has chronic hydrocephalus.  Note he is awake and follows commands at this time Antiseizure medications ordered  Dysphagia following intubation. - Monitor for now and reassess, swallowing should improve as mental status continue to clear.   Hypokalemia, hypomagnesemia, hypophosphatemia. Monitor replete  Anemia of critical illness and chronic disease. Transfuse per protocol for HB < 7.0  Homeless. Transition care team consulted  Hx of HLD. Continue Lipitor  Hx of hypothyroidism. Currently on Synthroid TSH 14.416  Deconditioning. PT OT have seen  Best practice:  Diet: NPO DVT prophylaxis: lovenox GI prophylaxis: protonix Mobility: as tolerated Code Status: Full Disposition: ready for transition to med-surg. Hospitalist notified.  Labs    CMP Latest Ref Rng & Units 09/07/2020 09/06/2020 09/06/2020  Glucose 70 - 99 mg/dL 09/08/2020) 127(N) 170(Y)  BUN 6 - 20 mg/dL 8 8 7   Creatinine 0.61 - 1.24 mg/dL 174(B  0.85 0.83  Sodium 135 - 145 mmol/L 144 141 142  Potassium 3.5 - 5.1 mmol/L 3.7 3.9 3.3(L)  Chloride 98 - 111 mmol/L 108 102 107  CO2 22 - 32 mmol/L 27 27 26   Calcium 8.9 - 10.3 mg/dL 9.2 9.1 )  Total Protein 6.5 - 8.1 g/dL - -  -  Total Bilirubin 0.3 - 1.2 mg/dL - - -  Alkaline Phos 38 - 126 U/L - - -  AST 15 - 41 U/L - - -  ALT 0 - 44 U/L - - -    CBC Latest Ref Rng & Units 09/06/2020 09/04/2020 09/03/2020  WBC 4.0 - 10.5 K/uL 9.5 13.7(H) -  Hemoglobin 13.0 - 17.0 g/dL 09/05/2020) 10.8(L) 9.2(L)  Hematocrit 39 - 52 % 29.1(L) 32.5(L) 27.0(L)  Platelets 150 - 400 K/uL 190 189 -    ABG    Component Value Date/Time   PHART 7.450 09/03/2020 0736   PCO2ART 37.1 09/03/2020 0736   PO2ART 86 09/03/2020 0736   HCO3 25.6 09/03/2020 0736   TCO2 27 09/03/2020 0736   ACIDBASEDEF 3.0 (H) 09/02/2020 0913   O2SAT 97.0 09/03/2020 0736    Lab Results  Component Value Date   HGBA1C 8.2 (H) 06/22/2020    Lab Results  Component Value Date   TSH 14.416 (H) 09/02/2020    13/09/2020, MD [facility] ICU Physician [facility]  Pager: [phone] After hours: [phone].  09/07/2020, 10:06 AM

## 2020-09-07 NOTE — Progress Notes (Signed)
Nutrition Follow-up  DOCUMENTATION CODES:   Not applicable  INTERVENTION:   If pt unable to pass swallow evaluation recommend cortrak placement and start enteral nutrition therapy  Glucerna 1.5 @ 70 ml/hr (1680 ml/day) Provides: 2520 kcal, 138 grams protein, and 1275 ml free water.    If pt can pass swallow eval and started on a PO diet will supplement as appropriate.    NUTRITION DIAGNOSIS:   Inadequate oral intake related to inability to eat as evidenced by NPO status.  Ongoing.   GOAL:   Patient will meet greater than or equal to 90% of their needs  Not met.   MONITOR:   Diet advancement  REASON FOR ASSESSMENT:   Consult Enteral/tube feeding initiation and management  ASSESSMENT:   Pt admitted with AMS and sonorous respirations from DKA. Pt required intubation in the setting of compromised airway with concern for aspiration. PMH includes type 1 DM, anxiety/depression, bipolar disorder, nephrolithiasis, seizures, hydrocephalus. Pt is reportedly intermittently homeless.  Pt discussed during ICU rounds and with RN.  Per RN pt self-extubated yesterday afternoon, today pt failed swallow eval today, plan is to re-test tomorrow. No cortrak planned at this time.   Medications reviewed and include: SSI, novolog, levemir Labs reviewed: CBG's: [phone]   Diet Order:   Diet Order            Diet NPO time specified  Diet effective now                 EDUCATION NEEDS:   Not appropriate for education at this time  Skin:  Skin Assessment: Reviewed RN Assessment  Last BM:  PTA  Height:   Ht Readings from Last 1 Encounters:  09/02/20 _0  (1.88 m)    Weight:   Wt Readings from Last 1 Encounters:  09/07/20 103 kg    Ideal Body Weight:     BMI:  Body mass index is 29.15 kg/m.  Estimated Nutritional Needs:   Kcal:  2400-2600  Protein:  125-145 grams  Fluid:  >2L  Greenley Martone P., RD, LDN, CNSC See AMiON for contact information

## 2020-09-07 NOTE — Progress Notes (Signed)
Inpatient Diabetes Program Recommendations  AACE/ADA: New Consensus Statement on Inpatient Glycemic Control (2015)  Target Ranges:  Prepandial:   less than 140 mg/dL      Peak postprandial:   less than 180 mg/dL (1-2 hours)      Critically ill patients:  140 - 180 mg/dL   Lab Results  Component Value Date   GLUCAP 221 (H) 09/07/2020   HGBA1C 8.2 (H) 06/22/2020    Review of Glycemic Control Results for Mark [name], Mark [name] (MRN [id number]) as of 09/07/2020 15:15  Ref. Range 09/06/2020 08:23 09/06/2020 11:46 09/06/2020 15:54 09/06/2020 19:43 09/06/2020 23:07 09/07/2020 03:21 09/07/2020 05:28 09/07/2020 08:49 09/07/2020 11:42 09/07/2020 13:08  Glucose-Capillary Latest Ref Range: 70 - 99 mg/dL 141 (H) 030 (H) 131 (H) 156 (H) 333 (H) 67 (L) 147 (H) 266 (H) 183 (H) 221 (H)    Inpatient Diabetes Program Recommendations:    Increase Levemir to 10 units bid.   Will continue to follow while inpatient.  Thank you, Dulce Sellar, RN, BSN Diabetes Coordinator Inpatient Diabetes Program [phone] (team pager from 8a-5p)

## 2020-09-07 NOTE — Progress Notes (Signed)
Rehab Admissions Coordinator Note:  Patient was screened by Clois Dupes for appropriateness for an Inpatient Acute Rehab Consult per therapy recommendations. .  At this time, we are recommending Inpatient Rehab consult. Please place rehab consult order if patient would like to be considered for admit. Please advise. Noted frequent admissions for same. Lives with friends, otherwise may be homeless.  Clois Dupes RN MSN 09/07/2020, 1:08 PM  I can be reached at [phone].

## 2020-09-07 NOTE — Progress Notes (Signed)
OT Evaluation  This 34 y/o male presents with the below. PTA pt reports being independent with ADL and functional mobility. Pt currently presenting with notable weakness (R side > L), poor sitting/standing balance, and overall endurance. Pt requiring two person assist for safe completion of sit<>Stand at [facility] with use of Stedy for transfer OOB to recliner this date. Prior to transfer pt tolerating sitting EOB 5-10 min with mod-maxA throughout for balance. He requires maxA for performing grooming ADL in supported sitting (in recliner), up to totalA for toileting ADL. VSS throughout. Pt to benefit from continued acute OT services and currently feel he is an excellent candidate for CIR level therapies at time of discharge to maximize his safety and independence with ADL and mobility.       09/07/20 1048  OT Visit Information  Last OT Received On 09/07/20  Assistance Needed +2  PT/OT/SLP Co-Evaluation/Treatment Yes  Reason for Co-Treatment Complexity of the patient's impairments (multi-system involvement);For patient/therapist safety;To address functional/ADL transfers  OT goals addressed during session ADL's and self-care  History of Present Illness 34 year old male with history of seizures on Depakote, type 1 diabetes with frequent episodes of DKA, hydrocephalus who presents to the emergency department with altered mental status.  Precautions  Precautions Fall  Restrictions  Weight Bearing Restrictions No  Home Living  Family/patient expects to be discharged to: Shelter/Homeless  Additional Comments pt reporting to PT start of session he is homeless, later in session reports was living with his mother until she passed approx 6 months ago   Prior Function  Level of Independence Independent  Communication  Communication Other (comment) (mumbled/soft spoken speech)  Pain Assessment  Pain Assessment Faces  Faces Pain Scale 0  Pain Intervention(s) Monitored during session  Cognition   Arousal/Alertness Awake/alert  Behavior During Therapy Flat affect  Overall Cognitive Status No family/caregiver present to determine baseline cognitive functioning  General Comments pt generally delayed with responses, follows majority of simple commands given increased time. pt tearful at times states his mother/father both died approx 6 months ago   Upper Extremity Assessment  Upper Extremity Assessment Generalized weakness;RUE deficits/detail;LUE deficits/detail  RUE Deficits / Details RUE notably weaker than LUE, 1/5 shoulder and elbow, 2/5 hand. fair grip strength  RUE Coordination decreased fine motor;decreased gross motor  LUE Deficits / Details 2/5 to 3-/5 throughout, fair grip strength   LUE Coordination decreased fine motor;decreased gross motor  Lower Extremity Assessment  Lower Extremity Assessment Defer to PT evaluation  ADL  Overall ADL's  Needs assistance/impaired  Eating/Feeding NPO  Grooming Maximal assistance;Sitting;Wash/dry face;Brushing hair  Grooming Details (indicate cue type and reason) requires assist to support elbow/forearm for basic face washing task; requires hand over hand assist for combing hair due to decreased fine motor. using LUE to perform today   Upper Body Bathing Moderate assistance;Sitting  Lower Body Bathing Total assistance;+2 for physical assistance;Sitting/lateral leans;Sit to/from stand  Upper Body Dressing  Maximal assistance;Sitting  Lower Body Dressing Total assistance;+2 for physical assistance;Sitting/lateral leans;Sit to/from stand;+2 for safety/equipment  Toilet Transfer Total assistance;+2 for physical assistance;+2 for safety/equipment;BSC  Toilet Transfer Details (indicate cue type and reason) via Stedy, simulated via transfer to recliner   Toileting- Clothing Manipulation and Hygiene Total assistance;+2 for physical assistance;+2 for safety/equipment;Sit to/from stand  Functional mobility during ADLs Maximal assistance;+2 for physical  assistance;+2 for safety/equipment (sit<>stand at [facility])  Vision- Assessment  Additional Comments will need to further assess   Bed Mobility  Overal bed mobility Needs Assistance  General bed  mobility comments pt seated EOB with PT upon arrival to room - pt with poor sitting balance and requiring overall maxA for static balance   Transfers  Overall transfer level Needs assistance  Equipment used Ambulation equipment used  Transfer via Psychologist, prison and probation services Sit to/from Stand  Sit to Stand Max assist;+2 physical assistance;+2 safety/equipment  General transfer comment max +2 for power up, hip extension, steadying, and sustained standing via posterior support. Stand x1 from EOB, x1 from stedy seat.  Balance  Overall balance assessment Needs assistance  Sitting-balance support Feet supported;Single extremity supported  Sitting balance-Leahy Scale Poor  Sitting balance - Comments mod-maxA for sitting balance   Postural control Posterior lean  Standing balance support Single extremity supported;Bilateral upper extremity supported  Standing balance-Leahy Scale Zero  Standing balance comment maxA+2  General Comments  General comments (skin integrity, edema, etc.) VSS   OT - End of Session  Equipment Utilized During Treatment Gait belt  Activity Tolerance Patient tolerated treatment well  Patient left in chair;with call bell/phone within reach;with chair alarm set  Nurse Communication Mobility status  OT Assessment  OT Recommendation/Assessment Patient needs continued OT Services  OT Visit Diagnosis Other abnormalities of gait and mobility (R26.89);Muscle weakness (generalized) (M62.81);Unsteadiness on feet (R26.81);Other symptoms and signs involving cognitive function  OT Problem List Decreased strength;Decreased activity tolerance;Decreased range of motion;Impaired balance (sitting and/or standing);Decreased cognition;Decreased coordination;Decreased safety awareness;Decreased  knowledge of use of DME or AE;Decreased knowledge of precautions;Impaired UE functional use;Increased edema  OT Plan  OT Frequency (ACUTE ONLY) Min 2X/week  OT Treatment/Interventions (ACUTE ONLY) Self-care/ADL training;Therapeutic exercise;Energy conservation;DME and/or AE instruction;Neuromuscular education;Therapeutic activities;Cognitive remediation/compensation;Patient/family education;Balance training;Visual/perceptual remediation/compensation  AM-PAC OT "6 Clicks" Daily Activity Outcome Measure (Version 2)  Help from another person eating meals? 1 (NPO)  Help from another person taking care of personal grooming? 2  Help from another person toileting, which includes using toliet, bedpan, or urinal? 1  Help from another person bathing (including washing, rinsing, drying)? 2  Help from another person to put on and taking off regular upper body clothing? 2  Help from another person to put on and taking off regular lower body clothing? 2  6 Click Score 10  OT Recommendation  Recommendations for Other Services Rehab consult  Follow Up Recommendations CIR  OT Equipment Other (comment) (TBD)  Individuals Consulted  Consulted and Agree with Results and Recommendations Patient  Acute Rehab OT Goals  Patient Stated Goal back to independence  OT Goal Formulation With patient  Time For Goal Achievement 09/21/20  Potential to Achieve Goals Good  OT Time Calculation  OT Start Time (ACUTE ONLY) 0924  OT Stop Time (ACUTE ONLY) 6503  OT Time Calculation (min) 28 min  OT General Charges  $OT Visit 1 Visit  OT Evaluation  $OT Eval Moderate Complexity 1 Mod  Written Expression  Dominant Hand Right   Marcy Siren, OT Acute Rehabilitation Services Pager [phone] Office [phone]

## 2020-09-07 NOTE — Evaluation (Signed)
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 Pt was intubated 11/12; self-extubated 11/16. PMH includes: DM type 1, anxiety, bipolar, depression, DM neuropathy, nephrolithiasis, seizures, hydrocephalus   Assessment / Plan / Recommendation Clinical Impression  Pt has signs of a post-extubation dysphagia including multiple subswallows per bolus, odynophagia, and frequent coughing that coincides with trials of all consistencies. His cough appears to be weak but is intermittently productive, and his vocal quality is moderately hoarse. He does not appear to be ready for a PO diet today. In terms of prognosis for a timely return to PO diet, his age is a positive factor. His self-extubation has the potential to prolong this, but will f/u for signs of improvement for potential to participate in FEES.   SLP Visit Diagnosis: Dysphagia, unspecified (R13.10)    Aspiration Risk  Moderate aspiration risk;Severe aspiration risk    Diet Recommendation NPO   Medication Administration: Via alternative means    Other  Recommendations Oral Care Recommendations: Oral care QID Other Recommendations: Have oral suction available   Follow up Recommendations  (tba)       Frequency and Duration min 2x/week  2 weeks       Prognosis Prognosis for Safe Diet Advancement: Good      Swallow Study   General HPI: Pt is a 34 yo male admitted with AMS, sonorous respirations, and concern for aspiration in the setting of DKA. Pt was intubated 11/12; self-extubated 11/16. PMH includes: DM type 1, anxiety, bipolar, depression, DM neuropathy, nephrolithiasis, seizures, hydrocephalus Type of Study: Bedside Swallow Evaluation Previous Swallow Assessment: none in chart Diet Prior to this Study: NPO Temperature Spikes Noted: No Respiratory Status: Room air History of Recent Intubation: Yes Length of Intubations (days): 4 days Date extubated: 09/06/20 (self-extubated) Behavior/Cognition: Alert;Cooperative Oral Cavity Assessment: Within Functional Limits Oral Care Completed by SLP: Yes Oral Cavity - Dentition: Missing dentition;Poor condition Self-Feeding Abilities: Total assist Patient Positioning: Upright in bed Baseline Vocal Quality: Hoarse Volitional Cough: Weak Volitional Swallow: Able to elicit    Oral/Motor/Sensory Function Overall Oral Motor/Sensory Function: Within functional limits   Ice Chips Ice chips: Impaired Presentation: Spoon Pharyngeal Phase Impairments: Cough - Immediate;Multiple swallows   Thin Liquid Thin Liquid: Impaired Presentation: Spoon;Straw Pharyngeal  Phase Impairments: Cough - Immediate;Cough - Delayed;Multiple swallows    Nectar Thick Nectar Thick Liquid: Not tested   Honey Thick Honey Thick Liquid: Not tested   Puree Puree: Impaired Presentation: Spoon Pharyngeal Phase Impairments: Multiple swallows;Cough - Immediate;Cough - Delayed   Solid     Solid: Not tested      Mark [name]., M.A. CCC-SLP Acute Rehabilitation Services Pager [phone] Office [phone]  09/07/2020,9:15 AM

## 2020-09-07 NOTE — Evaluation (Signed)
Physical Therapy Evaluation Patient Details Name: Mark [name] MRN: [id number] DOB: Aug 04, 1986 Today's Date: 09/07/2020   History of Present Illness  34 year old male with history of seizures on Depakote, type 1 diabetes with frequent episodes of DKA, hydrocephalus who presents to the emergency department on 11/12 with altered mental status with CBG in 500s and sonorous breath sounds.  Clinical Impression   Pt presents with generalized weakness but R>L, AMS, difficulty performing mobility tasks, and decreased activity tolerance vs baseline. Pt to benefit from acute PT to address deficits. Pt required max +2 assist for bed mobility and transfer OOB today, requiring use of stedy as pt demonstrating profound LE weakness. At baseline, pt reports being completely independent. PT to progress mobility as tolerated, and will continue to follow acutely.      Follow Up Recommendations CIR;Supervision/Assistance - 24 hour    Equipment Recommendations  Other (comment) (TBD)    Recommendations for Other Services       Precautions / Restrictions Precautions Precautions: Fall Restrictions Weight Bearing Restrictions: No      Mobility  Bed Mobility Overal bed mobility: Needs Assistance Bed Mobility: Rolling;Sidelying to Sit Rolling: Max assist Sidelying to sit: Max assist;HOB elevated       General bed mobility comments: Max assist trunk and LE management, scooting to EOB.    Transfers Overall transfer level: Needs assistance Equipment used: Ambulation equipment used Transfers: Sit to/from Stand Sit to Stand: Max assist;+2 physical assistance;+2 safety/equipment;From elevated surface         General transfer comment: max +2 for power up, hip extension, steadying, and sustained standing via posterior support. Stand x1 from EOB, x1 from stedy seat.  Ambulation/Gait             General Gait Details: unable this day  Stairs            Wheelchair Mobility     Modified Rankin (Stroke Patients Only)       Balance Overall balance assessment: Needs assistance Sitting-balance support: Feet supported Sitting balance-Leahy Scale: Poor Sitting balance - Comments: at least mod assist to maintain sitting EOB Postural control: Posterior lean   Standing balance-Leahy Scale: Zero Standing balance comment: max +2                             Pertinent Vitals/Pain Pain Assessment: Faces Faces Pain Scale: Hurts a little bit Pain Location: generalized during mobility Pain Descriptors / Indicators: Discomfort Pain Intervention(s): Limited activity within patient's tolerance;Monitored during session;Repositioned    Home Living Family/patient expects to be discharged to:: Shelter/Homeless                 Additional Comments: pt reporting to PT start of session he is homeless, later in session reports was living with his mother until she passed approx 6 months ago     Prior Function Level of Independence: Independent               Hand Dominance   Dominant Hand: Right    Extremity/Trunk Assessment   Upper Extremity Assessment Upper Extremity Assessment: Defer to OT evaluation RUE Deficits / Details: RUE notably weaker than LUE, 1/5 shoulder and elbow, 2/5 hand. fair grip strength RUE Coordination: decreased fine motor;decreased gross motor LUE Deficits / Details: 2/5 to 3-/5 throughout, fair grip strength  LUE Coordination: decreased fine motor;decreased gross motor    Lower Extremity Assessment Lower Extremity Assessment: Generalized weakness;RLE deficits/detail RLE Deficits / Details:  no DF/PF, 2-/5 knee extension, + edema RLE Coordination: decreased fine motor;decreased gross motor    Cervical / Trunk Assessment Cervical / Trunk Assessment: Kyphotic  Communication   Communication: Other (comment) (mumbled/soft spoken speech)  Cognition Arousal/Alertness: Awake/alert Behavior During Therapy: Flat  affect Overall Cognitive Status: No family/caregiver present to determine baseline cognitive functioning                                 General Comments: pt generally delayed with responses, follows majority of simple commands given increased time. pt tearful at times states his mother/father both died approx 6 months ago       General Comments General comments (skin integrity, edema, etc.): vss    Exercises     Assessment/Plan    PT Assessment Patient needs continued PT services  PT Problem List Decreased mobility;Decreased strength;Decreased safety awareness;Decreased activity tolerance;Decreased balance;Decreased knowledge of use of DME;Decreased cognition;Decreased coordination       PT Treatment Interventions DME instruction;Therapeutic activities;Gait training;Therapeutic exercise;Patient/family education;Balance training;Stair training;Functional mobility training;Neuromuscular re-education    PT Goals (Current goals can be found in the Care Plan section)  Acute Rehab PT Goals Patient Stated Goal: back to independence PT Goal Formulation: With patient Time For Goal Achievement: 09/21/20 Potential to Achieve Goals: Good    Frequency Min 3X/week   Barriers to discharge        Co-evaluation PT/OT/SLP Co-Evaluation/Treatment: Yes Reason for Co-Treatment: For patient/therapist safety;To address functional/ADL transfers;Complexity of the patient's impairments (multi-system involvement) PT goals addressed during session: Mobility/safety with mobility;Balance OT goals addressed during session: ADL's and self-care       AM-PAC PT "6 Clicks" Mobility  Outcome Measure Help needed turning from your back to your side while in a flat bed without using bedrails?: Total Help needed moving from lying on your back to sitting on the side of a flat bed without using bedrails?: Total Help needed moving to and from a bed to a chair (including a wheelchair)?: Total Help  needed standing up from a chair using your arms (e.g., wheelchair or bedside chair)?: Total Help needed to walk in hospital room?: Total Help needed climbing 3-5 steps with a railing? : Total 6 Click Score: 6    End of Session Equipment Utilized During Treatment: Gait belt Activity Tolerance: Patient limited by fatigue;Patient limited by pain Patient left: in chair;with call bell/phone within reach;with chair alarm set Nurse Communication: Mobility status;Need for lift equipment (stedy for back to bed) PT Visit Diagnosis: Other abnormalities of gait and mobility (R26.89);Muscle weakness (generalized) (M62.81)    Time: 7124-5809 PT Time Calculation (min) (ACUTE ONLY): 36 min   Charges:   PT Evaluation $PT Eval Moderate Complexity: 1 Mod         Wayne Brunker E, PT Acute Rehabilitation Services Pager [phone]  Office [phone]   Vernie Piet D Despina Hidden 09/07/2020, 12:33 PM

## 2020-09-08 ENCOUNTER — Inpatient Hospital Stay (HOSPITAL_COMMUNITY): Payer: Medicaid Other

## 2020-09-08 DIAGNOSIS — F314 Bipolar disorder, current episode depressed, severe, without psychotic features: Secondary | ICD-10-CM

## 2020-09-08 DIAGNOSIS — G911 Obstructive hydrocephalus: Secondary | ICD-10-CM

## 2020-09-08 LAB — GLUCOSE, CAPILLARY
Glucose-Capillary: 202 mg/dL — ABNORMAL HIGH (ref 70–99)
Glucose-Capillary: 209 mg/dL — ABNORMAL HIGH (ref 70–99)
Glucose-Capillary: 220 mg/dL — ABNORMAL HIGH (ref 70–99)
Glucose-Capillary: 222 mg/dL — ABNORMAL HIGH (ref 70–99)
Glucose-Capillary: 226 mg/dL — ABNORMAL HIGH (ref 70–99)
Glucose-Capillary: 288 mg/dL — ABNORMAL HIGH (ref 70–99)

## 2020-09-08 LAB — BASIC METABOLIC PANEL
Anion gap: 9 (ref 5–15)
BUN: 10 mg/dL (ref 6–20)
CO2: 26 mmol/L (ref 22–32)
Calcium: 9.2 mg/dL (ref 8.9–10.3)
Chloride: 107 mmol/L (ref 98–111)
Creatinine, Ser: 0.82 mg/dL (ref 0.61–1.24)
GFR, Estimated: >60 mL/min (ref >60)
Glucose, Bld: 205 mg/dL — ABNORMAL HIGH (ref 70–99)
Potassium: 3.9 mmol/L (ref 3.5–5.1)
Sodium: 142 mmol/L (ref 135–145)

## 2020-09-08 LAB — CULTURE, RESPIRATORY W GRAM STAIN: Culture: NORMAL

## 2020-09-08 NOTE — Progress Notes (Signed)
Modified Barium Swallow Progress Note  Patient Details  Name: Mark [name] MRN: [id number] Date of Birth: [birth year]  Today's Date: 09/08/2020  Modified Barium Swallow completed.  Full report located under Chart Review in the Imaging Section.  Brief recommendations include the following:  Clinical Impression   Pt's oropharyngeal swallow appeared to be functional until he started to consume large, sequential boluses of thin liquids via straw. Given reduced coordination and likely reduced glottic closure, pt had aspiration that was silent in nature. He had much better coordination with thin liquids via cup sips, suspect in part because he requires assist for feeding and therefore SLP could better control his rate. Recommend starting mechanical soft diet in light of dentition with thin liquids via single cup sips. Pt would benefit from full supervision and medications whole in puree. Will f/u for tolerance, with swallowing suspected to continue to improve with more time post-extubation.    Swallow Evaluation Recommendations       SLP Diet Recommendations: Dysphagia 3 (Mech soft) solids;Thin liquid   Liquid Administration via: Cup;No straw   Medication Administration: Whole meds with puree   Supervision: Staff to assist with self feeding;Full supervision/cueing for compensatory strategies   Compensations: Minimize environmental distractions;Slow rate   Postural Changes: Seated upright at 90 degrees   Oral Care Recommendations: Oral care BID        [name]., M.A. CCC-SLP Acute Rehabilitation Services Pager [phone] Office [phone]  09/08/2020,1:13 PM

## 2020-09-08 NOTE — Progress Notes (Signed)
  Speech Language Pathology Treatment: Dysphagia  Patient Details Name: Mark [name] MRN: [id number] DOB: 1986-09-23 Today's Date: 09/08/2020 Time: 4481-8563 SLP Time Calculation (min) (ACUTE ONLY): 12 min  Assessment / Plan / Recommendation Clinical Impression  Pt has improved but persistent sore throat and dysphonia. He is very eager for PO intake and does not remember having a clinical swallowing evaluation on previous date. There is not immediate coughing observed, but delayed and congested sounding cough is noted after brief delay, intermittently throughout trials. Suspect that he is likely ready for PO diet today but will proceed with MBS this am first to better evaluate oropharyngeal function. Pt could have a few pieces of ice after oral care and/or meds crushed in puree if needed before testing is completed.    HPI HPI: Pt is a 34 yo male admitted with AMS, sonorous respirations, and concern for aspiration in the setting of DKA. Pt was intubated 11/12; self-extubated 11/16. PMH includes: DM type 1, anxiety, bipolar, depression, DM neuropathy, nephrolithiasis, seizures, hydrocephalus      SLP Plan  MBS       Recommendations  Diet recommendations: NPO;Other(comment) (few ice chips after oral care) Medication Administration: Crushed with puree                Oral Care Recommendations: Oral care QID Follow up Recommendations: Inpatient Rehab SLP Visit Diagnosis: Dysphagia, unspecified (R13.10) Plan: MBS       GO                [name]., M.A. CCC-SLP Acute Rehabilitation Services Pager [phone] Office [phone]  09/08/2020, 8:55 AM

## 2020-09-08 NOTE — Progress Notes (Addendum)
PROGRESS NOTE  DESMON HITCHNER ZOX:[id number] DOB: 03/10/1986 DOA: 09/02/2020 PCP: Patient, No Pcp Per   LOS: 6 days   Brief narrative:  Patient is a 34 years old male with past medical history of diabetes mellitus type 1, anxiety, bipolar disorder, depression, seizure disorder, history of hydrocephalus chronic smoker presented to hospital with altered mental status.  He was noted to be in diabetic ketoacidosis and was intubated on 09/02/2020 for airway protection and concerns for aspiration.  CT scan of the head showed marked hydrocephalus with acute aqueduct stenosis and right frontal ventriculostomy site with drain.  He was started on insulin drip.  Neurology was consulted.  He was extubated on 09/04/2020 and was considered stable for transfer to medical floor.  Patient was also started on vancomycin and Rocephin IV during hospitalization.  Assessment/Plan:  Acute hypoxic respiratory failure with compromised airway and aspiration pneumonitis status post intubation and currently extubated on room air.  Patient completed IV Rocephin and vancomycin.  Cultures negative in 5 days.  DKA with poorly controlled DM type 1. Currently on sliding scale insulin.  Seen by speech and swallow and recommend dysphagia 3 diet today.  On Levemir twice a day.  Chronic hydrocephalus. Hx of Bipolar, depression, anxiety. Continue antiseizure medication.  Neurology on board.  Patient does have chronic hydrocephalus.  Spoke with the patient's friend who is unaware that he had any neurosurgical follow-up recently.  Reviewed previous CT scan report.  I spoke with neurosurgery on-call Dr. Franky Macho reviewed the patient's CT scans and he stated that they have remained stable and there is no need for any further surgical intervention.  Dysphagia following intubation. Speech and swallow on board.  Advance diet as improved.   Continue Protonix.  Hypokalemia, hypomagnesemia, hypophosphatemia. Improved with replacement.   Latest potassium of 3.9.  A.m. of 1.7 and phosphorus of 2.7.  Anemia of critical illness and chronic disease. Hemoglobin from 08/27/2020 at 9.7.  Check hemoglobin in a.m.  Homelessness. I spoke with the patient's friend on the phone today.  He states that he is currently living in a home with his friend.  History of hyperlipidemia  Continue Lipitor  Hypothyroidism. Continue Synthroid.  TSH was elevated at 14.4.  Will need to monitor closely as outpatient.  Possibility of noncompliance.  Patient is homeless.  Deconditioning, debility with profound weakness mostly on the right side. PT, OT  on board.  Recommended CIR, consult CIR.  Recent CT head with marked hydrocephalus involving third and lateral ventricles consistent with aqueductal stenosis.  Status post right frontal ventriculostomy site with no drain tube seen as per CT scan..    DVT prophylaxis: enoxaparin (LOVENOX) injection 40 mg Start: 09/03/20 0945 SCDs Start: 09/02/20 0844    Code Status: Full code  Family Communication: I spoke with the patient's friend Mr [name] on the phone and updated about the clinical condition of the patient.   Status is: Inpatient  Remains inpatient appropriate because:IV treatments appropriate due to intensity of illness or inability to take PO and Inpatient level of care appropriate due to severity of illness   Dispo: The patient is from: Home              Anticipated d/c is to: CIR              Anticipated d/c date is: 2 days              Patient currently is not medically stable to d/c.  Consultants:  PCCM  Procedures:  Intubation and subsequent extubation  Antibiotics:  . None at this time  Anti-infectives (From admission, onward)   Start     Dose/Rate Route Frequency Ordered Stop   09/03/20 0945  cefTRIAXone (ROCEPHIN) 2 g in sodium chloride 0.9 % 100 mL IVPB        2 g 200 mL/hr over 30 Minutes Intravenous Every 24 hours 09/03/20 0854 09/06/20 1126   09/03/20 0800   ceFEPIme (MAXIPIME) 2 g in sodium chloride 0.9 % 100 mL IVPB  Status:  Discontinued        2 g 200 mL/hr over 30 Minutes Intravenous Every 24 hours 09/02/20 0742 09/02/20 1331   09/02/20 2200  vancomycin (VANCOREADY) IVPB 750 mg/150 mL  Status:  Discontinued        750 mg 150 mL/hr over 60 Minutes Intravenous Every 12 hours 09/02/20 1329 09/03/20 0854   09/02/20 1800  ceFEPIme (MAXIPIME) 2 g in sodium chloride 0.9 % 100 mL IVPB  Status:  Discontinued        2 g 200 mL/hr over 30 Minutes Intravenous Every 8 hours 09/02/20 1331 09/03/20 0854   09/02/20 0745  ceFEPIme (MAXIPIME) 2 g in sodium chloride 0.9 % 100 mL IVPB        2 g 200 mL/hr over 30 Minutes Intravenous  Once 09/02/20 0732 09/02/20 0932   09/02/20 0745  vancomycin (VANCOREADY) IVPB 2000 mg/400 mL        2,000 mg 200 mL/hr over 120 Minutes Intravenous  Once 09/02/20 0733 09/02/20 1257   09/02/20 0741  vancomycin variable dose per unstable renal function (pharmacist dosing)  Status:  Discontinued         Does not apply See admin instructions 09/02/20 0742 09/02/20 1329   09/02/20 0730  aztreonam (AZACTAM) 2 g in sodium chloride 0.9 % 100 mL IVPB  Status:  Discontinued        2 g 200 mL/hr over 30 Minutes Intravenous  Once 09/02/20 0728 09/02/20 0732   09/02/20 0730  metroNIDAZOLE (FLAGYL) IVPB 500 mg        500 mg 100 mL/hr over 60 Minutes Intravenous  Once 09/02/20 0728 09/02/20 1040   09/02/20 0730  vancomycin (VANCOCIN) IVPB 1000 mg/200 mL premix  Status:  Discontinued        1,000 mg 200 mL/hr over 60 Minutes Intravenous  Once 09/02/20 0728 09/02/20 0733     Subjective: Today, patient was seen and examined at bedside.  Patient alert awake but confused and disoriented.  States that he has cough with mild sputum production.  Feels fatigued and weak.  Denies any shortness of breath or chest pain.  Objective: Vitals:   09/08/20 0331 09/08/20 0825  BP: (!) 145/82 132/84  Pulse: 91 78  Resp: 18 18  Temp: 98.6 F (37 C)  98.8 F (37.1 C)  SpO2: 93% 94%    Intake/Output Summary (Last 24 hours) at 09/08/2020 0836 Last data filed at 09/07/2020 1300 Gross per 24 hour  Intake 223.75 ml  Output 350 ml  Net -126.25 ml   Filed Weights   09/06/20 0500 09/07/20 0500 09/08/20 0500  Weight: 103.2 kg 103 kg 102.2 kg   Body mass index is 28.93 kg/m.   Physical Exam:  GENERAL: Patient is awake but confused disoriented HENT: No scleral pallor or icterus. Pupils equally reactive to light. Oral mucosa is moist NECK: is supple, no gross swelling noted. CHEST: Decreased breath sounds bilaterally. CVS: S1 and S2 heard, no murmur. Regular rate and  rhythm.  ABDOMEN: Soft, non-tender, bowel sounds are present. EXTREMITIES: Trace edema noted CNS: Cranial nerves are intact.  Speech distinct.  Appears to be confused disoriented.  Moves left upper extremity.  Right upper extremity weakness more than right lower extremity weakness. SKIN: warm and dry without rashes.  Data Review: I have personally reviewed the following laboratory data and studies,  CBC: Recent Labs  Lab 09/02/20 0605 09/02/20 0626 09/02/20 0844 09/02/20 0913 09/03/20 0503 09/03/20 0631 09/03/20 0736 09/04/20 0226 09/06/20 0352  WBC 33.1*  --  23.0*  --   --  15.9*  --  13.7* 9.5  NEUTROABS 29.2*  --   --   --   --   --   --   --   --   HGB 13.2   < > 11.5*   < > 10.2* 10.2* 9.2* 10.8* 9.7*  HCT 40.8   < > 35.2*   < > 30.0* 30.2* 27.0* 32.5* 29.1*  MCV 94.9  --  94.4  --   --  89.9  --  93.9 92.7  PLT 304  --  223  --   --  182  --  189 190   < > = values in this interval not displayed.   Basic Metabolic Panel: Recent Labs  Lab 09/03/20 0631 09/03/20 0631 09/03/20 0736 09/03/20 1632 09/04/20 0226 09/04/20 0226 09/05/20 0657 09/06/20 0352 09/06/20 1304 09/07/20 0105 09/08/20 0327  NA 138   < >   < >  --  139   < > 137 142 141 144 142  K 3.3*   < >   < >  --  3.6   < > 3.5 3.3* 3.9 3.7 3.9  CL 109   < >  --   --  107   < > 106  107 102 108 107  CO2 22   < >  --   --  26   < > GLUCOSE 159*   < >  --   --  177*   < > 246* 221* 299* 223* 205*  BUN 12   < >  --   --  8   < > CREATININE 1.09   < >  --   --  0.89   < > 0.78 0.83 0.85 0.79 0.82  CALCIUM 8.7*   < >  --   --  8.8*   < > 8.4* 8.6* 9.1 9.2 9.2  MG 1.4*  --   --  1.5* 1.5*  --  1.7  --   --  1.7  --   PHOS 1.1*  --   --  1.7* 1.9*  --  2.3*  --   --  2.7  --    < > = values in this interval not displayed.   Liver Function Tests: Recent Labs  Lab 09/02/20 0625  AST 364*  ALT 157*  ALKPHOS 109  BILITOT 0.8  PROT 7.3  ALBUMIN 4.3   Recent Labs  Lab 09/02/20 0844  LIPASE 20  AMYLASE 154*   Recent Labs  Lab 09/02/20 0625  AMMONIA 38*   Cardiac Enzymes: Recent Labs  Lab 09/02/20 0844  CKTOTAL 998*   BNP (last 3 results) No results for input(s): BNP in the last 8760 hours.  ProBNP (last 3 results) No results for input(s): PROBNP in the last 8760 hours.  CBG: Recent Labs  Lab 09/07/20 1632  09/07/20 2025 09/08/20 0031 09/08/20 0333 09/08/20 0827  GLUCAP 117* 193* 209* 202* 222*   Recent Results (from the past 240 hour(s))  Respiratory Panel by RT PCR (Flu A&B, Covid) - Nasopharyngeal Swab     Status: None   Collection Time: 09/02/20  8:04 AM   Specimen: Nasopharyngeal Swab  Result Value Ref Range Status   SARS Coronavirus 2 by RT PCR NEGATIVE NEGATIVE Final    Comment: (NOTE) SARS-CoV-2 target nucleic acids are NOT DETECTED.  The SARS-CoV-2 RNA is generally detectable in upper respiratoy specimens during the acute phase of infection. The lowest concentration of SARS-CoV-2 viral copies this assay can detect is 131 copies/mL. A negative result does not preclude SARS-Cov-2 infection and should not be used as the sole basis for treatment or other patient management decisions. A negative result may occur with  improper specimen collection/handling, submission of specimen other than nasopharyngeal swab,  presence of viral mutation(s) within the areas targeted by this assay, and inadequate number of viral copies (<131 copies/mL). A negative result must be combined with clinical observations, patient history, and epidemiological information. The expected result is Negative.  Fact Sheet for Patients:  https://www.moore.com/https://www.fda.gov/media/142436/download  Fact Sheet for Healthcare Providers:  https://www.young.biz/https://www.fda.gov/media/142435/download  This test is no t yet approved or cleared by the [place] FDA and  has been authorized for detection and/or diagnosis of SARS-CoV-2 by FDA under an Emergency Use Authorization (EUA). This EUA will remain  in effect (meaning this test can be used) for the duration of the COVID-19 declaration under Section 564(b)(1) of the Act, 21 U.S.C. section 360bbb-3(b)(1), unless the authorization is terminated or revoked sooner.     Influenza A by PCR NEGATIVE NEGATIVE Final   Influenza B by PCR NEGATIVE NEGATIVE Final    Comment: (NOTE) The Xpert Xpress SARS-CoV-2/FLU/RSV assay is intended as an aid in  the diagnosis of influenza from Nasopharyngeal swab specimens and  should not be used as a sole basis for treatment. Nasal washings and  aspirates are unacceptable for Xpert Xpress SARS-CoV-2/FLU/RSV  testing.  Fact Sheet for Patients: https://www.moore.com/https://www.fda.gov/media/142436/download  Fact Sheet for Healthcare Providers: https://www.young.biz/https://www.fda.gov/media/142435/download  This test is not yet approved or cleared by the [place] FDA and  has been authorized for detection and/or diagnosis of SARS-CoV-2 by  FDA under an Emergency Use Authorization (EUA). This EUA will remain  in effect (meaning this test can be used) for the duration of the  Covid-19 declaration under Section 564(b)(1) of the Act, 21  U.S.C. section 360bbb-3(b)(1), unless the authorization is  terminated or revoked. Performed at [facility], [address] [place], [place] [address]   Blood culture  (routine x 2)     Status: None   Collection Time: 09/02/20  8:48 AM   Specimen: BLOOD  Result Value Ref Range Status   Specimen Description BLOOD SITE NOT SPECIFIED  Final   Special Requests   Final    BOTTLES DRAWN AEROBIC AND ANAEROBIC Blood Culture results may not be optimal due to an excessive volume of blood received in culture bottles   Culture   Final    NO GROWTH 5 DAYS Performed at [facility], [address] [place], [place] [address]    Report Status 09/07/2020 FINAL  Final  Blood culture (routine x 2)     Status: None   Collection Time: 09/02/20  8:59 AM   Specimen: BLOOD  Result Value Ref Range Status   Specimen Description BLOOD SITE NOT SPECIFIED  Final   Special Requests   Final    BOTTLES DRAWN AEROBIC AND ANAEROBIC Blood Culture adequate volume   Culture   Final    NO GROWTH 5 DAYS Performed at [facility], [address] [place], [place] [address]    Report Status 09/07/2020 FINAL  Final  Urine culture     Status: None   Collection Time: 09/02/20 10:49 AM   Specimen: Urine, Random  Result Value Ref Range Status   Specimen Description URINE, RANDOM  Final   Special Requests NONE  Final   Culture   Final    NO GROWTH Performed at [facility], [address] [place], [place] [address]    Report Status 09/03/2020 FINAL  Final  MRSA PCR Screening     Status: None   Collection Time: 09/02/20 12:06 PM   Specimen: Nasal Mucosa; Nasopharyngeal  Result Value Ref Range Status   MRSA by PCR NEGATIVE NEGATIVE Final    Comment:        The GeneXpert MRSA Assay (FDA approved for NASAL specimens only), is one component of a comprehensive MRSA colonization surveillance program. It is not intended to diagnose MRSA infection nor to guide or monitor treatment for MRSA infections. Performed at [facility], [address], [place], [place] [address]   Culture, blood (routine x 2)     Status: None   Collection Time: 09/02/20  1:29 PM    Specimen: BLOOD  Result Value Ref Range Status   Specimen Description BLOOD RIGHT ANTECUBITAL  Final   Special Requests   Final    BOTTLES DRAWN AEROBIC AND ANAEROBIC Blood Culture adequate volume   Culture   Final    NO GROWTH 5 DAYS Performed at [facility], [address], [place], [place] [address]    Report Status 09/07/2020 FINAL  Final  Culture, blood (routine x 2)     Status: None   Collection Time: 09/02/20  1:29 PM   Specimen: BLOOD LEFT HAND  Result Value Ref Range Status   Specimen Description BLOOD LEFT HAND  Final   Special Requests   Final    BOTTLES DRAWN AEROBIC AND ANAEROBIC Blood Culture adequate volume   Culture   Final    NO GROWTH 5 DAYS Performed at [facility], [address], [place], [place] [address]    Report Status 09/07/2020 FINAL  Final  Culture, respiratory (tracheal aspirate)     Status: None (Preliminary result)   Collection Time: 09/06/20  2:41 PM   Specimen: Tracheal Aspirate; Respiratory  Result Value Ref Range Status   Specimen Description TRACHEAL ASPIRATE  Final   Special Requests NONE  Final   Gram Stain   Final    RARE WBC PRESENT, PREDOMINANTLY MONONUCLEAR NO ORGANISMS SEEN    Culture   Final    CULTURE REINCUBATED FOR BETTER GROWTH Performed at [facility], [name] [place], [place] [address]    Report Status PENDING  Incomplete     Studies: DG Chest Port 1 View  Result Date: 09/07/2020 CLINICAL DATA:  Respiratory distress EXAM: PORTABLE CHEST 1 VIEW COMPARISON:  09/05/2020 FINDINGS: Nasogastric tube has been removed. Focal pulmonary infiltrate within the left mid and lower lung zone has improved in the interval, though has not yet completely resolved. Pulmonary insufflation is normal and symmetric. No pneumothorax or pleural effusion. Cardiac size within normal limits. Pulmonary vascularity is normal. IMPRESSION: Improving left lung pulmonary  infiltrate. Electronically Signed   By: Helyn Numbers MD    On: 09/07/2020 06:48      Joycelyn Das, MD  [facility] 09/08/2020

## 2020-09-09 LAB — GLUCOSE, CAPILLARY
Glucose-Capillary: 169 mg/dL — ABNORMAL HIGH (ref 70–99)
Glucose-Capillary: 212 mg/dL — ABNORMAL HIGH (ref 70–99)
Glucose-Capillary: 289 mg/dL — ABNORMAL HIGH (ref 70–99)
Glucose-Capillary: 342 mg/dL — ABNORMAL HIGH (ref 70–99)
Glucose-Capillary: 358 mg/dL — ABNORMAL HIGH (ref 70–99)
Glucose-Capillary: 84 mg/dL (ref 70–99)

## 2020-09-09 MED ORDER — INSULIN ASPART 100 UNIT/ML ~~LOC~~ SOLN
0.0000 [IU] | Freq: Three times a day (TID) | SUBCUTANEOUS | Status: DC
  Administered 2020-09-09: 5 [IU] via SUBCUTANEOUS
  Administered 2020-09-10: 9 [IU] via SUBCUTANEOUS

## 2020-09-09 MED ORDER — GLUCERNA SHAKE PO LIQD
237.0000 mL | Freq: Three times a day (TID) | ORAL | Status: DC
  Administered 2020-09-09 – 2020-10-01 (×49): 237 mL via ORAL
  Filled 2020-09-09 (×9): qty 237

## 2020-09-09 MED ORDER — QUETIAPINE FUMARATE 200 MG PO TABS
300.0000 mg | ORAL_TABLET | Freq: Every day | ORAL | Status: DC
  Administered 2020-09-09 – 2020-09-22 (×14): 300 mg via ORAL
  Filled 2020-09-09 (×14): qty 1

## 2020-09-09 MED ORDER — INSULIN DETEMIR 100 UNIT/ML ~~LOC~~ SOLN
10.0000 [IU] | Freq: Two times a day (BID) | SUBCUTANEOUS | Status: DC
  Administered 2020-09-09 (×2): 10 [IU] via SUBCUTANEOUS
  Filled 2020-09-09 (×4): qty 0.1

## 2020-09-09 MED ORDER — INSULIN ASPART 100 UNIT/ML ~~LOC~~ SOLN
4.0000 [IU] | Freq: Three times a day (TID) | SUBCUTANEOUS | Status: DC
  Administered 2020-09-09 – 2020-09-10 (×2): 4 [IU] via SUBCUTANEOUS

## 2020-09-09 NOTE — Progress Notes (Signed)
Physical Therapy Treatment Patient Details Name: Mark [name] MRN: [id number] DOB: February 07, 1986 Today's Date: 09/09/2020    History of Present Illness 34 year old male with history of seizures on Depakote, type 1 diabetes with frequent episodes of DKA, hydrocephalus who presents to the emergency department on 11/12 with altered mental status with CBG in 500s and sonorous breath sounds.    PT Comments    Pt very pleasant and motivated to participate and improve. Treated pt in conjunction with OT to maximize pt safety and quality of session. Pt would not initiate movements when provided only verbal cues. Instead he required tactile cues or physical assistance to initiate all tasks. He was able to lift his legs but required increased periods of time and assistance to move through full range of motion to perform tasks. The pt required modAx2 to roll either direction and maxAx2 to come to sit from sidelying with HOB elevated. In addition, he required maxA majority of the time and intermittently only needed min-modA to maintain his static sitting balance as he demonstrated poor trunk control. The pt was able to power up to stand in the stedy with only modAx1 this date, but displays fatigue and leg weakness with short standing bouts as his knees will buckle and his trunk flexes. Will continue to follow acutely. The pt could benefit greatly from intensive therapy services in the CIR setting to maximize his independence and safety with mobility.   Follow Up Recommendations  CIR;Supervision/Assistance - 24 hour     Equipment Recommendations  Other (comment) (TBD)    Recommendations for Other Services       Precautions / Restrictions Precautions Precautions: Fall Restrictions Weight Bearing Restrictions: No    Mobility  Bed Mobility Overal bed mobility: Needs Assistance Bed Mobility: Rolling;Sidelying to Sit Rolling: Mod assist;+2 for physical assistance Sidelying to sit: Mod assist;+2 for  physical assistance       General bed mobility comments: Max assist trunk and LE management, scooting to EOB.  Transfers Overall transfer level: Needs assistance Equipment used: Ambulation equipment used Transfers: Sit to/from Stand Sit to Stand: Mod assist;+2 physical assistance Stand pivot transfers: Total assist;+2 physical assistance;+2 safety/equipment       General transfer comment: max +2 for power up, hip extension, steadying, and sustained standing via posterior support. Stand x1 from EOB, x1 from stedy seat.  Ambulation/Gait             General Gait Details: unable this day   Stairs             Wheelchair Mobility    Modified Rankin (Stroke Patients Only)       Balance Overall balance assessment: Needs assistance Sitting-balance support: Feet supported Sitting balance-Leahy Scale: Poor Sitting balance - Comments: at least mod assist to maintain sitting EOB Postural control: Posterior lean Standing balance support: Bilateral upper extremity supported Standing balance-Leahy Scale: Poor Standing balance comment: Static standing in stedy x2 bouts of ~20-30 sec each, with min-modA to maintain balance. Trunk sway noted along with intermittent knee buckling.                            Cognition Arousal/Alertness: Awake/alert Behavior During Therapy: Flat affect Overall Cognitive Status: No family/caregiver present to determine baseline cognitive functioning                                 General Comments:  Pt did not respond to verbal cues alone. He required tactile cues and assistance physically to initiate all tasks. Pt smiles occasionally and laughs appropriately during session.       Exercises      General Comments        Pertinent Vitals/Pain Pain Assessment: No/denies pain Faces Pain Scale: No hurt Pain Intervention(s): Limited activity within patient's tolerance;Monitored during session;Repositioned    Home  Living                      Prior Function            PT Goals (current goals can now be found in the care plan section) Acute Rehab PT Goals Patient Stated Goal: to improve PT Goal Formulation: With patient Time For Goal Achievement: 09/21/20 Potential to Achieve Goals: Good Progress towards PT goals: Progressing toward goals    Frequency    Min 4X/week      PT Plan Frequency needs to be updated    Co-evaluation PT/OT/SLP Co-Evaluation/Treatment: Yes Reason for Co-Treatment: For patient/therapist safety;To address functional/ADL transfers PT goals addressed during session: Mobility/safety with mobility;Balance        AM-PAC PT "6 Clicks" Mobility   Outcome Measure  Help needed turning from your back to your side while in a flat bed without using bedrails?: A Lot Help needed moving from lying on your back to sitting on the side of a flat bed without using bedrails?: A Lot Help needed moving to and from a bed to a chair (including a wheelchair)?: Total Help needed standing up from a chair using your arms (e.g., wheelchair or bedside chair)?: A Lot Help needed to walk in hospital room?: Total Help needed climbing 3-5 steps with a railing? : Total 6 Click Score: 9    End of Session Equipment Utilized During Treatment: Gait belt Activity Tolerance: Patient tolerated treatment well Patient left: in chair;with call bell/phone within reach;with chair alarm set Nurse Communication: Mobility status;Need for lift equipment;Other (comment) (chair alarm cord insert broken) PT Visit Diagnosis: Other abnormalities of gait and mobility (R26.89);Muscle weakness (generalized) (M62.81);Unsteadiness on feet (R26.81);Difficulty in walking, not elsewhere classified (R26.2)     Time: 6213-0865 PT Time Calculation (min) (ACUTE ONLY): 25 min  Charges:  $Therapeutic Activity: 8-22 mins                     [name], PT, DPT Acute Rehabilitation Services  Pager:  [phone] Office: [phone]    [name] 09/09/2020, 12:43 PM

## 2020-09-09 NOTE — Progress Notes (Addendum)
Inpatient Rehab Admissions:  Inpatient Rehab Consult received.  I met with patient at the bedside for rehabilitation assessment and to discuss goals and expectations of an inpatient rehab admission.  We discussed goals of min assist with ELOS of 2-3 weeks.  Pt states that he lives with a friend who is a retired Engineer, agricultural professor who would be willing to help him once he discharges from the hospital.  He gave consent for me to speak to [facility] regarding discharge plans.  Pt able to follow basic conversation but unsure how fully he comprehends.  He would likely benefit from cognitive eval with SLP.   I did speak with his friend, [name], who confirmed that he was comfortable providing min assist for patient at discharge, but also states the he is disabled himself.  Would like to see how pt progresses with therapy over the next 1-2 sessions to see if min assist goals are realistic.  [name] and I briefly discussed medicaid pending status, but he did not appear to think it was wise for pt to agree to the cost of rehab.  I did call [name] from pt's phone so they could discuss, and I forwarded to [employer] to review.  Will f/u with patient on Monday.   Signed: Shann Medal, PT, DPT Admissions Coordinator [phone] 09/09/20  3:08 PM

## 2020-09-09 NOTE — Progress Notes (Signed)
Patients midnight CBG was 84. RN contacted MD on call. Dr. Antionette Char advised to hold the standing 4 unit dose of insulin.

## 2020-09-09 NOTE — Progress Notes (Addendum)
PROGRESS NOTE  Mark [name] WUJ:[id number] DOB: 11/22/1985 DOA: 09/02/2020 PCP: Patient, No Pcp Per   LOS: 7 days   Brief narrative:  Patient is a 34 years old male with past medical history of diabetes mellitus type 1, anxiety, bipolar disorder, depression, seizure disorder, history of hydrocephalus chronic smoker presented to hospital with altered mental status.  He was noted to be in diabetic ketoacidosis and was intubated on 09/02/2020 for airway protection and concerns for aspiration.  CT scan of the head showed marked hydrocephalus with acute aqueduct stenosis and right frontal ventriculostomy site with drain.  He was started on insulin drip.  Neurology was consulted.  He was extubated on 09/04/2020 and was considered stable for transfer to medical floor.  Patient was also started on vancomycin and Rocephin IV during hospitalization.  Assessment/Plan:  Acute hypoxic respiratory failure with compromised airway and aspiration pneumonitis status post intubation and currently extubated on room air.  Patient completed IV Rocephin and vancomycin.  Cultures negative in 5 days.  DKA with poorly controlled DM type 1. DKA has resolved at this time. Seen by speech and swallow and recommend dysphagia 3 diet today.  On Levemir twice daily.  Changed to 10 units twice daily.  Diabetic coordinator on board.  We will add NovoLog 3 times daily with meals and continue sliding scale insulin.  Chronic hydrocephalus. Hx of Bipolar, depression, anxiety. Continue antiseizure medication.  Neurology on board.  Patient does have chronic hydrocephalus.  Spoke with the patient's friend who is unaware that he had any neurosurgical follow-up recently.  CT head with marked hydrocephalus involving third and lateral ventricles consistent with aqueductal stenosis.  Status post right frontal ventriculostomy site with no drain tube seen as per CT scan..  I had spoken with neurosurgery on-call Dr. Franky Machoabbell yesterday  regarding the CT scan of the patient.  He indicated that the CT scans have remained stable  and there was no need for further surgical intervention.   Dysphagia following intubation. Speech and swallow on board.  Advance diet as per speech therapy.  Continue Protonix.  Hypokalemia, hypomagnesemia, hypophosphatemia. Improved with replacement.    Anemia of critical illness and chronic disease. Hemoglobin from 08/27/2020 at 9.7.  Check CBC in a.m.  Homelessness. I spoke with the patient's friend on the phone yesterday he states that he is currently living in a home with his friend.  History of hyperlipidemia  Continue Lipitor  Hypothyroidism. Continue Synthroid.  TSH was elevated at 14.4.  Will need to monitor closely as outpatient.  Possibility of noncompliance.    Deconditioning, debility with right-sided weakness.. PT, OT  on board.  Recommended CIR, CIR has been consulted.    DVT prophylaxis: enoxaparin (LOVENOX) injection 40 mg Start: 09/03/20 0945 SCDs Start: 09/02/20 0844    Code Status: Full code  Family Communication: None today.  I was unable to reach the patient's friend  Jonny RuizJohn on the phone today.  I spoke with the patient's friend Mr Jonny RuizJohn on the phone and updated about the clinical condition of the patient yesterday.   Addendum:  09/09/2020 5:46 PM  Nursing staff reported that the patient wanted to leave and had pulled his lines.  Spoke with the patient at length regarding his clinical condition and the risk of leaving.  Patient has decided to stay behind to continue treatment.  Also spoke with nursing staff at bedside.  I tried to reach the patient's friend Jonny RuizJohn but was unable to reach him.  Status is: Inpatient  Remains inpatient appropriate because:IV treatments appropriate due to intensity of illness or inability to take PO and Inpatient level of care appropriate due to severity of illness   Dispo: The patient is from: Home              Anticipated d/c is to:  CIR              Anticipated d/c date is: 2 days              Patient currently is not medically stable to d/c.  Consultants:  PCCM  Procedures:  Intubation and subsequent extubation  Antibiotics:  . None at this time  Subjective: Today, patient was seen and examined at bedside.  Denies any pain, nausea, vomiting.  He states that he is sad because his parents have recently died.  Otherwise answering questions.    Objective: Vitals:   09/09/20 0731 09/09/20 1144  BP: 118/87 136/90  Pulse: 91 79  Resp: 18 18  Temp: 98.7 F (37.1 C) 98.8 F (37.1 C)  SpO2: 98% 97%    Intake/Output Summary (Last 24 hours) at 09/09/2020 1313 Last data filed at 09/09/2020 0321 Gross per 24 hour  Intake --  Output 1100 ml  Net -1100 ml   Filed Weights   09/07/20 0500 09/08/20 0500 09/09/20 0453  Weight: 103 kg 102.2 kg 97.4 kg   Body mass index is 27.57 kg/m.   Physical Exam:  GENERAL: Patient is awake but confused and disoriented HENT: No scleral pallor or icterus. Pupils equally reactive to light. Oral mucosa is moist NECK: is supple, no gross swelling noted. CHEST: Decreased breath sounds bilaterally. CVS: S1 and S2 heard, no murmur. Regular rate and rhythm.  ABDOMEN: Soft, non-tender, bowel sounds are present. EXTREMITIES: Trace edema noted CNS: Cranial nerves are intact.  Speech distinct.  Appears to be confused disoriented.  Moves left upper extremity.  Right upper extremity weakness more than right lower extremity weakness. SKIN: warm and dry without rashes.  Data Review: I have personally reviewed the following laboratory data and studies,  CBC: Recent Labs  Lab 09/03/20 0503 09/03/20 0631 09/03/20 0736 09/04/20 0226 09/06/20 0352  WBC  --  15.9*  --  13.7* 9.5  HGB 10.2* 10.2* 9.2* 10.8* 9.7*  HCT 30.0* 30.2* 27.0* 32.5* 29.1*  MCV  --  89.9  --  93.9 92.7  PLT  --  182  --  189 190   Basic Metabolic Panel: Recent Labs  Lab 09/03/20 0631 09/03/20 0631  09/03/20 0736 09/03/20 1632 09/04/20 0226 09/04/20 0226 09/05/20 0657 09/06/20 0352 09/06/20 1304 09/07/20 0105 09/08/20 0327  NA 138   < >   < >  --  139   < > 137 142 141 144 142  K 3.3*   < >   < >  --  3.6   < > 3.5 3.3* 3.9 3.7 3.9  CL 109   < >  --   --  107   < > 106 107 102 108 107  CO2 22   < >  --   --  26   < > 23 26 27 27 26   GLUCOSE 159*   < >  --   --  177*   < > 246* 221* 299* 223* 205*  BUN 12   < >  --   --  8   < > 9 7 8 8 10   CREATININE 1.09   < >  --   --  0.89   < > 0.78 0.83 0.85 0.79 0.82  CALCIUM 8.7*   < >  --   --  8.8*   < > 8.4* 8.6* 9.1 9.2 9.2  MG 1.4*  --   --  1.5* 1.5*  --  1.7  --   --  1.7  --   PHOS 1.1*  --   --  1.7* 1.9*  --  2.3*  --   --  2.7  --    < > = values in this interval not displayed.   Liver Function Tests: No results for input(s): AST, ALT, ALKPHOS, BILITOT, PROT, ALBUMIN in the last 168 hours. No results for input(s): LIPASE, AMYLASE in the last 168 hours. No results for input(s): AMMONIA in the last 168 hours. Cardiac Enzymes: No results for input(s): CKTOTAL, CKMB, CKMBINDEX, TROPONINI in the last 168 hours. BNP (last 3 results) No results for input(s): BNP in the last 8760 hours.  ProBNP (last 3 results) No results for input(s): PROBNP in the last 8760 hours.  CBG: Recent Labs  Lab 09/08/20 2020 09/09/20 0005 09/09/20 0313 09/09/20 0730 09/09/20 1142  GLUCAP 220* 84 212* 169* 342*   Recent Results (from the past 240 hour(s))  Respiratory Panel by RT PCR (Flu A&B, Covid) - Nasopharyngeal Swab     Status: None   Collection Time: 09/02/20  8:04 AM   Specimen: Nasopharyngeal Swab  Result Value Ref Range Status   SARS Coronavirus 2 by RT PCR NEGATIVE NEGATIVE Final    Comment: (NOTE) SARS-CoV-2 target nucleic acids are NOT DETECTED.  The SARS-CoV-2 RNA is generally detectable in upper respiratoy specimens during the acute phase of infection. The lowest concentration of SARS-CoV-2 viral copies this assay can  detect is 131 copies/mL. A negative result does not preclude SARS-Cov-2 infection and should not be used as the sole basis for treatment or other patient management decisions. A negative result may occur with  improper specimen collection/handling, submission of specimen other than nasopharyngeal swab, presence of viral mutation(s) within the areas targeted by this assay, and inadequate number of viral copies (<131 copies/mL). A negative result must be combined with clinical observations, patient history, and epidemiological information. The expected result is Negative.  Fact Sheet for Patients:  https://www.moore.com/  Fact Sheet for Healthcare Providers:  https://www.young.biz/  This test is no t yet approved or cleared by the [place] FDA and  has been authorized for detection and/or diagnosis of SARS-CoV-2 by FDA under an Emergency Use Authorization (EUA). This EUA will remain  in effect (meaning this test can be used) for the duration of the COVID-19 declaration under Section 564(b)(1) of the Act, 21 U.S.C. section 360bbb-3(b)(1), unless the authorization is terminated or revoked sooner.     Influenza A by PCR NEGATIVE NEGATIVE Final   Influenza B by PCR NEGATIVE NEGATIVE Final    Comment: (NOTE) The Xpert Xpress SARS-CoV-2/FLU/RSV assay is intended as an aid in  the diagnosis of influenza from Nasopharyngeal swab specimens and  should not be used as a sole basis for treatment. Nasal washings and  aspirates are unacceptable for Xpert Xpress SARS-CoV-2/FLU/RSV  testing.  Fact Sheet for Patients: https://www.moore.com/  Fact Sheet for Healthcare Providers: https://www.young.biz/  This test is not yet approved or cleared by the [place] FDA and  has been authorized for detection and/or diagnosis of SARS-CoV-2 by  FDA under an Emergency Use Authorization (EUA). This EUA will remain  in  effect (meaning this test can be used)  for the duration of the  Covid-19 declaration under Section 564(b)(1) of the Act, 21  U.S.C. section 360bbb-3(b)(1), unless the authorization is  terminated or revoked. Performed at [facility], [address], [place], [place] [address]   Blood culture (routine x 2)     Status: None   Collection Time: 09/02/20  8:48 AM   Specimen: BLOOD  Result Value Ref Range Status   Specimen Description BLOOD SITE NOT SPECIFIED  Final   Special Requests   Final    BOTTLES DRAWN AEROBIC AND ANAEROBIC Blood Culture results may not be optimal due to an excessive volume of blood received in culture bottles   Culture   Final    NO GROWTH 5 DAYS Performed at [facility], [address] [place], [place] [address]    Report Status 09/07/2020 FINAL  Final  Blood culture (routine x 2)     Status: None   Collection Time: 09/02/20  8:59 AM   Specimen: BLOOD  Result Value Ref Range Status   Specimen Description BLOOD SITE NOT SPECIFIED  Final   Special Requests   Final    BOTTLES DRAWN AEROBIC AND ANAEROBIC Blood Culture adequate volume   Culture   Final    NO GROWTH 5 DAYS Performed at [facility], [address] [place], [place] [address]    Report Status 09/07/2020 FINAL  Final  Urine culture     Status: None   Collection Time: 09/02/20 10:49 AM   Specimen: Urine, Random  Result Value Ref Range Status   Specimen Description URINE, RANDOM  Final   Special Requests NONE  Final   Culture   Final    NO GROWTH Performed at [facility], [address], [place], [place] [address]    Report Status 09/03/2020 FINAL  Final  MRSA PCR Screening     Status: None   Collection Time: 09/02/20 12:06 PM   Specimen: Nasal Mucosa; Nasopharyngeal  Result Value Ref Range Status   MRSA by PCR NEGATIVE NEGATIVE Final    Comment:        The GeneXpert MRSA Assay (FDA approved for NASAL specimens only), is one component of a comprehensive MRSA  colonization surveillance program. It is not intended to diagnose MRSA infection nor to guide or monitor treatment for MRSA infections. Performed at [facility], [address] [place], [place] [address]   Culture, blood (routine x 2)     Status: None   Collection Time: 09/02/20  1:29 PM   Specimen: BLOOD  Result Value Ref Range Status   Specimen Description BLOOD RIGHT ANTECUBITAL  Final   Special Requests   Final    BOTTLES DRAWN AEROBIC AND ANAEROBIC Blood Culture adequate volume   Culture   Final    NO GROWTH 5 DAYS Performed at [facility], [address] [place], [place] [address]    Report Status 09/07/2020 FINAL  Final  Culture, blood (routine x 2)     Status: None   Collection Time: 09/02/20  1:29 PM   Specimen: BLOOD LEFT HAND  Result Value Ref Range Status   Specimen Description BLOOD LEFT HAND  Final   Special Requests   Final    BOTTLES DRAWN AEROBIC AND ANAEROBIC Blood Culture adequate volume   Culture   Final    NO GROWTH 5 DAYS Performed at [facility], [address], [place], [place] [address]  Report Status 09/07/2020 FINAL  Final  Culture, respiratory (tracheal aspirate)     Status: None   Collection Time: 09/06/20  2:41 PM   Specimen: Tracheal Aspirate; Respiratory  Result Value Ref Range Status   Specimen Description TRACHEAL ASPIRATE  Final   Special Requests NONE  Final   Gram Stain   Final    RARE WBC PRESENT, PREDOMINANTLY MONONUCLEAR NO ORGANISMS SEEN    Culture   Final    FEW Normal respiratory flora-no Staph aureus or Pseudomonas seen Performed at [facility], [address], [place], [place] [address]    Report Status 09/08/2020 FINAL  Final     Studies: DG Swallowing Func-Speech Pathology  Result Date: 09/08/2020 Objective Swallowing Evaluation: Type of Study: MBS-Modified Barium Swallow Study  Patient Details Name: CORNEILUS HEGGIE MRN: [id number] Date of Birth: [birth year] Today's Date: 09/08/2020 Time:  SLP Start Time (ACUTE ONLY): 1212 -SLP Stop Time (ACUTE ONLY): 1226 SLP Time Calculation (min) (ACUTE ONLY): 14 min Past Medical History: Past Medical History: Diagnosis Date . Anxiety  . Bipolar disorder (HCC)  . Depression  . Diabetes mellitus without complication (HCC)  . 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 Hydrocephalus (HCC)  . Kidney stones  . Seizures (HCC)  Past Surgical History: Past Surgical History: Procedure Laterality Date . KIDNEY STONE SURGERY   HPI: Pt is a 34 yo male admitted with AMS, sonorous respirations, and concern for aspiration in the setting of DKA. Pt was intubated 11/12; self-extubated 11/16. PMH includes: DM type 1, anxiety, bipolar, depression, DM neuropathy, nephrolithiasis, seizures, hydrocephalus  Subjective: pt is eager for POs Assessment / Plan / Recommendation CHL IP CLINICAL IMPRESSIONS 09/08/2020 Clinical Impression Pt's oropharyngeal swallow appeared to be functional until he started to consume large, sequential boluses of thin liquids via straw. Given reduced coordination and likely reduced glottic closure, pt had aspiration that was silent in nature. He had much better coordination with thin liquids via cup sips, suspect in part because he requires assist for feeding and therefore SLP could better control his rate. Recommend starting mechanical soft diet in light of dentition with thin liquids via single cup sips. Pt would benefit from full supervision and medications whole in puree. Will f/u for tolerance, with swallowing suspected to continue to improve with more time post-extubation.  SLP Visit Diagnosis Dysphagia, unspecified (R13.10) Attention and concentration deficit following -- Frontal lobe and executive function deficit following -- Impact on safety and function Mild aspiration risk   CHL IP TREATMENT RECOMMENDATION 09/08/2020 Treatment Recommendations Therapy as outlined in treatment plan below   Prognosis 09/08/2020 Prognosis  for Safe Diet Advancement Good Barriers to Reach Goals -- Barriers/Prognosis Comment -- CHL IP DIET RECOMMENDATION 09/08/2020 SLP Diet Recommendations Dysphagia 3 (Mech soft) solids;Thin liquid Liquid Administration via Cup;No straw Medication Administration Whole meds with puree Compensations Minimize environmental distractions;Slow rate Postural Changes Seated upright at 90 degrees   CHL IP OTHER RECOMMENDATIONS 09/08/2020 Recommended Consults -- Oral Care Recommendations Oral care BID Other Recommendations --   CHL IP FOLLOW UP RECOMMENDATIONS 09/08/2020 Follow up Recommendations Inpatient Rehab   CHL IP FREQUENCY AND DURATION 09/08/2020 Speech Therapy Frequency (ACUTE ONLY) min 2x/week Treatment Duration 2 weeks      CHL IP ORAL PHASE 09/08/2020 Oral Phase WFL Oral - Pudding Teaspoon -- Oral - Pudding Cup -- Oral - Honey Teaspoon -- Oral - Honey Cup -- Oral - Nectar Teaspoon -- Oral - Nectar Cup -- Oral - Nectar Straw --  Oral - Thin Teaspoon -- Oral - Thin Cup -- Oral - Thin Straw -- Oral - Puree -- Oral - Mech Soft -- Oral - Regular -- Oral - Multi-Consistency -- Oral - Pill -- Oral Phase - Comment --  CHL IP PHARYNGEAL PHASE 09/08/2020 Pharyngeal Phase Impaired Pharyngeal- Pudding Teaspoon -- Pharyngeal -- Pharyngeal- Pudding Cup -- Pharyngeal -- Pharyngeal- Honey Teaspoon -- Pharyngeal -- Pharyngeal- Honey Cup -- Pharyngeal -- Pharyngeal- Nectar Teaspoon -- Pharyngeal -- Pharyngeal- Nectar Cup -- Pharyngeal -- Pharyngeal- Nectar Straw -- Pharyngeal -- Pharyngeal- Thin Teaspoon -- Pharyngeal -- Pharyngeal- Thin Cup WFL Pharyngeal -- Pharyngeal- Thin Straw Penetration/Aspiration before swallow;Reduced airway/laryngeal closure Pharyngeal Material enters airway, passes BELOW cords without attempt by patient to eject out (silent aspiration) Pharyngeal- Puree WFL Pharyngeal -- Pharyngeal- Mechanical Soft WFL Pharyngeal -- Pharyngeal- Regular -- Pharyngeal -- Pharyngeal- Multi-consistency -- Pharyngeal --  Pharyngeal- Pill -- Pharyngeal -- Pharyngeal Comment --  CHL IP CERVICAL ESOPHAGEAL PHASE 09/08/2020 Cervical Esophageal Phase WFL Pudding Teaspoon -- Pudding Cup -- Honey Teaspoon -- Honey Cup -- Nectar Teaspoon -- Nectar Cup -- Nectar Straw -- Thin Teaspoon -- Thin Cup -- Thin Straw -- Puree -- Mechanical Soft -- Regular -- Multi-consistency -- Pill -- Cervical Esophageal Comment -- [name]., M.A. CCC-SLP Acute Rehabilitation Services Pager [phone] Office [phone] 09/08/2020, 1:18 PM                 Joycelyn Das, MD  [facility] 09/09/2020

## 2020-09-09 NOTE — Progress Notes (Addendum)
Nutrition Follow-up  DOCUMENTATION CODES:   Not applicable  INTERVENTION:   Glucerna Shake po TID, each supplement provides 220 kcal and 10 grams of protein  Staff assist with feedings.   Encourage po intake.    NUTRITION DIAGNOSIS:   Inadequate oral intake related to inability to eat as evidenced by NPO status.  Progressing  GOAL:   Patient will meet greater than or equal to 90% of their needs  Progressing  MONITOR:   Diet advancement  REASON FOR ASSESSMENT:   Consult Enteral/tube feeding initiation and management  ASSESSMENT:   Pt admitted with AMS and sonorous respirations from DKA. Pt required intubation in the setting of compromised airway with concern for aspiration. PMH includes type 1 DM, anxiety/depression, bipolar disorder, nephrolithiasis, seizures, hydrocephalus. Pt is reportedly intermittently homeless.  11/12 intubated; OG placed (tip in stomach per xray) 11/13 off insulin gtt 11/16 self-extubated  Pt advanced to Dysphagia 3 diet with thin liquids following MBS. Pt reports no difficulty swallowing or chewing. Pt reports his appetite is increased and that he has consumed 100% of his meals since diet advancement. No recorded meal completions in chart.   Pt denies any recent changes in weight. Per chart review, pt has experienced a 35 lb weight loss since August. Noted that pt has some mild muscle and fat depletions. LE's did not appear depleted but d/t mild edema, possible depletions could be masked.    Labs reviewed: Glucose 289   Medications reviewed and include: SSI   Nutrition Focused Physical Exam:     Most Recent Value  Orbital Region No depletion  Upper Arm Region Mild depletion  Thoracic and Lumbar Region Unable to assess  Buccal Region Mild depletion  Temple Region Mild depletion  Clavicle Bone Region Mild depletion  Clavicle and Acromion Bone Region Mild depletion  Scapular Bone Region No depletion  Dorsal Hand No depletion   Patellar Region No depletion  Anterior Thigh Region No depletion  Posterior Calf Region No depletion  Edema (RD Assessment) Mild  Hair Reviewed  Eyes Reviewed  Mouth Reviewed  Skin Reviewed  Nails Reviewed      Diet Order:   Diet Order            DIET DYS 3 Room service appropriate? Yes with Assist; Fluid consistency: Thin  Diet effective now                 EDUCATION NEEDS:   Not appropriate for education at this time  Skin:  Skin Assessment: Reviewed RN Assessment  Last BM:  unknown  Height:   Ht Readings from Last 1 Encounters:  09/02/20 6\' 2"  (1.88 m)    Weight:   Wt Readings from Last 1 Encounters:  09/09/20 97.4 kg    BMI:  Body mass index is 27.57 kg/m.  Estimated Nutritional Needs:   Kcal:  2400-2600  Protein:  125-145 grams  Fluid:  >2L   09/11/20, Dietetic Intern Pager: [phone] If unavailable: [phone]

## 2020-09-09 NOTE — Progress Notes (Signed)
Occupational Therapy Treatment Patient Details Name: Mark [name] MRN: [id number] DOB: 09/23/1986 Today's Date: 09/09/2020    History of present illness 34 year old male with history of seizures on Depakote, type 1 diabetes with frequent episodes of DKA, hydrocephalus who presents to the emergency department on 11/12 with altered mental status with CBG in 500s and sonorous breath sounds.   OT comments  Pt. Seen with PT for skilled therapy session.  Max/total a for lb bathing/peri care bed level.  Max/total a for ub dressing bed level.  Requires max verbal and tactile cues and instructions for initiation and completion of tasks. Inconsistent with ability to maintain desired task/instructions. Fluctuating levels of assistance required for bed mobility and sitting balance.  x2 physical assistance required for all mobility.  Will continue to progress eob/oob next session and incorporate adls as able.    Follow Up Recommendations       Equipment Recommendations       Recommendations for Other Services      Precautions / Restrictions Precautions Precautions: Fall Restrictions Weight Bearing Restrictions: No       Mobility Bed Mobility Overal bed mobility: Needs Assistance Bed Mobility: Rolling;Sidelying to Sit Rolling: Mod assist;+2 for physical assistance Sidelying to sit: Mod assist;+2 for physical assistance       General bed mobility comments: Max assist trunk and LE management, scooting to EOB.  Transfers Overall transfer level: Needs assistance Equipment used: Ambulation equipment used Transfers: Sit to/from Stand Sit to Stand: Mod assist;+2 physical assistance Stand pivot transfers: (P) Total assist;+2 physical assistance;+2 safety/equipment       General transfer comment: max +2 for power up, hip extension, steadying, and sustained standing via posterior support. Stand x1 from EOB, x1 from stedy seat.    Balance Overall balance assessment: Needs  assistance Sitting-balance support: Feet supported Sitting balance-Leahy Scale: Poor Sitting balance - Comments: at least mod assist to maintain sitting EOB                                   ADL either performed or assessed with clinical judgement   ADL Overall ADL's : Needs assistance/impaired             Lower Body Bathing: Total assistance;+2 for physical assistance;Bed level Lower Body Bathing Details (indicate cue type and reason): pt. incontinent of large BM, was unaware until reviewed with him. Upper Body Dressing : Maximal assistance;Sitting;Bed level Upper Body Dressing Details (indicate cue type and reason): max inst. and hand over hand assistance to place arms in sleeves of gown     Toilet Transfer: Total assistance;+2 for physical assistance;+2 for safety/equipment;BSC Toilet Transfer Details (indicate cue type and reason): via Stedy, simulated via transfer to recliner  Toileting- Clothing Manipulation and Hygiene: Total assistance;+2 for physical assistance;+2 for safety/equipment;Bed level       Functional mobility during ADLs: Maximal assistance;+2 for physical assistance;+2 for safety/equipment;Total assistance       Vision       Perception     Praxis      Cognition Arousal/Alertness: Awake/alert Behavior During Therapy: Flat affect Overall Cognitive Status: No family/caregiver present to determine baseline cognitive functioning                                 General Comments: (P) Pt did not respond to verbal cues alone. He required tactile cues  and assistance physically to initiate all tasks. Pt smiles occasionally and laughs appropriately during session.         Exercises     Shoulder Instructions       General Comments      Pertinent Vitals/ Pain       Pain Assessment: No/denies pain Faces Pain Scale: (P) No hurt Pain Intervention(s): (P) Limited activity within patient's tolerance;Monitored during  session;Repositioned  Home Living                                          Prior Functioning/Environment              Frequency           Progress Toward Goals  OT Goals(current goals can now be found in the care plan section)  Progress towards OT goals: Progressing toward goals  Acute Rehab OT Goals Patient Stated Goal: (P) to improve  Plan      Co-evaluation    PT/OT/SLP Co-Evaluation/Treatment: Yes Reason for Co-Treatment: For patient/therapist safety;To address functional/ADL transfers PT goals addressed during session: (P) Mobility/safety with mobility;Balance        AM-PAC OT "6 Clicks" Daily Activity     Outcome Measure                    End of Session Equipment Utilized During Treatment: Gait belt;Other (comment) (stedy)      Activity Tolerance     Patient Left in chair;with call bell/phone within reach;with chair alarm set   Nurse Communication Other (comment) (PT reviewed with rn that chair alarm was not working, involving issue with abiltiy to plug into the wall)        Time: 3295-1884 OT Time Calculation (min): 25 min  Charges: OT General Charges $OT Visit: 1 Visit OT Treatments $Self Care/Home Management : 8-22 mins  Boneta Lucks, COTA/L Acute Rehabilitation [phone]   [name] 09/09/2020, 12:40 PM

## 2020-09-09 NOTE — Progress Notes (Signed)
Patient insisted on leaving the facility, not considering his weakness, and condition. This nurse explained the the importance of staying to patient. Charge nurse notified and doctor was also in room to persuade patient to stay and get better before he can leave the hospital. Patient is in room at this time. Will continue to monitor.

## 2020-09-09 NOTE — Plan of Care (Signed)
  Problem: Health Behavior/Discharge Planning: Goal: Ability to manage health-related needs will improve Outcome: Progressing   Problem: Clinical Measurements: Goal: Ability to maintain clinical measurements within normal limits will improve Outcome: Progressing Goal: Will remain free from infection Outcome: Progressing Goal: Diagnostic test results will improve Outcome: Progressing Goal: Respiratory complications will improve Outcome: Progressing Goal: Cardiovascular complication will be avoided Outcome: Progressing   Problem: Nutrition: Goal: Adequate nutrition will be maintained Outcome: Progressing   Problem: Elimination: Goal: Will not experience complications related to bowel motility Outcome: Progressing Goal: Will not experience complications related to urinary retention Outcome: Progressing   Problem: Respiratory: Goal: Ability to maintain a clear airway and adequate ventilation will improve Outcome: Progressing   Problem: Activity: Goal: Ability to tolerate increased activity will improve Outcome: Progressing   Problem: Respiratory: Goal: Ability to maintain a clear airway and adequate ventilation will improve Outcome: Progressing   Problem: Role Relationship: Goal: Method of communication will improve Outcome: Progressing   Problem: Education: Goal: Knowledge of General Education information will improve Description: Including pain rating scale, medication(s)/side effects and non-pharmacologic comfort measures Outcome: Progressing   Problem: Health Behavior/Discharge Planning: Goal: Ability to manage health-related needs will improve Outcome: Progressing   Problem: Clinical Measurements: Goal: Ability to maintain clinical measurements within normal limits will improve Outcome: Progressing Goal: Will remain free from infection Outcome: Progressing Goal: Diagnostic test results will improve Outcome: Progressing Goal: Respiratory complications will  improve Outcome: Progressing Goal: Cardiovascular complication will be avoided Outcome: Progressing   Problem: Activity: Goal: Risk for activity intolerance will decrease Outcome: Progressing   Problem: Nutrition: Goal: Adequate nutrition will be maintained Outcome: Progressing   Problem: Coping: Goal: Level of anxiety will decrease Outcome: Progressing   Problem: Elimination: Goal: Will not experience complications related to bowel motility Outcome: Progressing Goal: Will not experience complications related to urinary retention Outcome: Progressing   Problem: Pain Managment: Goal: General experience of comfort will improve Outcome: Progressing   Problem: Safety: Goal: Ability to remain free from injury will improve Outcome: Progressing   Problem: Skin Integrity: Goal: Risk for impaired skin integrity will decrease Outcome: Progressing

## 2020-09-09 NOTE — Progress Notes (Signed)
  Speech Language Pathology Treatment: Dysphagia  Patient Details Name: Mark [name] MRN: [id number] DOB: 01/30/1986 Today's Date: 09/09/2020 Time: 8270-7867 SLP Time Calculation (min) (ACUTE ONLY): 10 min  Assessment / Plan / Recommendation Clinical Impression  Pt needed a lot of redirection to task today, very perseverative on his need to go home and not very aware of his acute impairments. Straw was noted at his bedside and removed, with education reinforced about results of MBS. Pt consumed cup sips of thin liquids with Min-Mod tactile and verbal cues to take single sips. No overt s/s of aspiration were noted, but given silent aspiration on MBS, further f/u and monitoring for evidence of tolerance will be needed. Would consider ordering cognitive evaluation as well.   HPI HPI: Pt is a 34 yo male admitted with AMS, sonorous respirations, and concern for aspiration in the setting of DKA. Pt was intubated 11/12; self-extubated 11/16. PMH includes: DM type 1, anxiety, bipolar, depression, DM neuropathy, nephrolithiasis, seizures, hydrocephalus      SLP Plan  Continue with current plan of care       Recommendations  Diet recommendations: Dysphagia 3 (mechanical soft);Thin liquid Liquids provided via: Cup;No straw Medication Administration: Crushed with puree Supervision: Staff to assist with self feeding;Full supervision/cueing for compensatory strategies Compensations: Minimize environmental distractions;Slow rate Postural Changes and/or Swallow Maneuvers: Seated upright 90 degrees                Oral Care Recommendations: Oral care BID Follow up Recommendations: Inpatient Rehab SLP Visit Diagnosis: Dysphagia, unspecified (R13.10) Plan: Continue with current plan of care       GO                [name]., M.A. CCC-SLP Acute Rehabilitation Services Pager [phone] Office [phone]  09/09/2020, 4:46 PM

## 2020-09-09 NOTE — Plan of Care (Signed)
  Problem: Elimination: Goal: Will not experience complications related to bowel motility Outcome: Progressing Goal: Will not experience complications related to urinary retention Outcome: Progressing   Problem: Education: Goal: Knowledge of General Education information will improve Description: Including pain rating scale, medication(s)/side effects and non-pharmacologic comfort measures Outcome: Progressing   Problem: Respiratory: Goal: Ability to maintain a clear airway and adequate ventilation will improve Outcome: Progressing

## 2020-09-09 NOTE — Progress Notes (Signed)
Inpatient Diabetes Program Recommendations  AACE/ADA: New Consensus Statement on Inpatient Glycemic Control (2015)  Target Ranges:  Prepandial:   less than 140 mg/dL      Peak postprandial:   less than 180 mg/dL (1-2 hours)      Critically ill patients:  140 - 180 mg/dL   Lab Results  Component Value Date   GLUCAP 169 (H) 09/09/2020   HGBA1C 8.2 (H) 06/22/2020    Review of Glycemic Control Results for Mark [name], Mark [name] (MRN [id number]) as of 09/09/2020 09:23  Ref. Range 09/08/2020 20:20 09/09/2020 00:05 09/09/2020 03:13 09/09/2020 07:30  Glucose-Capillary Latest Ref Range: 70 - 99 mg/dL 888 (H) 84 280 (H) 034 (H)   Diabetes history: Type 1 DM Outpatient Diabetes medications: Novolog 70/30 35 units BID Current orders for Inpatient glycemic control: Novolog 4 units Q4H, Novolog 0-15 units Q4H, Levemir 8 units BID  Inpatient Diabetes Program Recommendations:    Verified intake with nursing >80% of meal. Assuming lunchtime CBG increased due to patient consuming 2 meals prior to 1100.   Consider:  -Increasing Levemir to 10 units BID -Changing correction to Novolog 0-9 units TID & HS - Adding Novolog 5 units TID (assuming patient is consuming >50% of meal).   Thanks, Lujean Rave, MSN, RNC-OB Diabetes Coordinator [phone] (8a-5p)

## 2020-09-10 LAB — COMPREHENSIVE METABOLIC PANEL
ALT: 21 U/L (ref 0–44)
AST: 17 U/L (ref 15–41)
Albumin: 2.9 g/dL — ABNORMAL LOW (ref 3.5–5.0)
Alkaline Phosphatase: 63 U/L (ref 38–126)
Anion gap: 11 (ref 5–15)
BUN: 14 mg/dL (ref 6–20)
CO2: 23 mmol/L (ref 22–32)
Calcium: 9.2 mg/dL (ref 8.9–10.3)
Chloride: 101 mmol/L (ref 98–111)
Creatinine, Ser: 0.92 mg/dL (ref 0.61–1.24)
GFR, Estimated: >60 mL/min (ref >60)
Glucose, Bld: 418 mg/dL — ABNORMAL HIGH (ref 70–99)
Potassium: 4.6 mmol/L (ref 3.5–5.1)
Sodium: 135 mmol/L (ref 135–145)
Total Bilirubin: 0.5 mg/dL (ref 0.3–1.2)
Total Protein: 6.4 g/dL — ABNORMAL LOW (ref 6.5–8.1)

## 2020-09-10 LAB — CBC
HCT: 35.3 % — ABNORMAL LOW (ref 39.0–52.0)
Hemoglobin: 11.8 g/dL — ABNORMAL LOW (ref 13.0–17.0)
MCH: 29.9 pg (ref 26.0–34.0)
MCHC: 33.4 g/dL (ref 30.0–36.0)
MCV: 89.6 fL (ref 80.0–100.0)
Platelets: 344 10*3/uL (ref 150–400)
RBC: 3.94 MIL/uL — ABNORMAL LOW (ref 4.22–5.81)
RDW: 12.3 % (ref 11.5–15.5)
WBC: 9.7 10*3/uL (ref 4.0–10.5)
nRBC: 0 % (ref 0.0–0.2)

## 2020-09-10 LAB — GLUCOSE, CAPILLARY
Glucose-Capillary: 398 mg/dL — ABNORMAL HIGH (ref 70–99)
Glucose-Capillary: 329 mg/dL — ABNORMAL HIGH (ref 70–99)
Glucose-Capillary: 270 mg/dL — ABNORMAL HIGH (ref 70–99)
Glucose-Capillary: 356 mg/dL — ABNORMAL HIGH (ref 70–99)
Glucose-Capillary: 247 mg/dL — ABNORMAL HIGH (ref 70–99)

## 2020-09-10 LAB — MAGNESIUM: Magnesium: 1.7 mg/dL (ref 1.7–2.4)

## 2020-09-10 MED ORDER — ATORVASTATIN CALCIUM 10 MG PO TABS: 20.0000 mg | ORAL_TABLET | Freq: Every day | ORAL | Status: DC

## 2020-09-10 MED ORDER — ACETAMINOPHEN 325 MG PO TABS: 650.0000 mg | ORAL_TABLET | Freq: Four times a day (QID) | ORAL | Status: DC | PRN

## 2020-09-10 MED ORDER — POLYETHYLENE GLYCOL 3350 17 G PO PACK: 17.0000 g | PACK | Freq: Every day | ORAL | Status: DC | PRN

## 2020-09-10 MED ORDER — INSULIN DETEMIR 100 UNIT/ML ~~LOC~~ SOLN
15.0000 [IU] | Freq: Two times a day (BID) | SUBCUTANEOUS | Status: DC
  Administered 2020-09-10 (×2): 15 [IU] via SUBCUTANEOUS
  Filled 2020-09-10 (×4): qty 0.15

## 2020-09-10 MED ORDER — INSULIN ASPART 100 UNIT/ML ~~LOC~~ SOLN
6.0000 [IU] | Freq: Three times a day (TID) | SUBCUTANEOUS | Status: DC
  Administered 2020-09-10 – 2020-09-11 (×5): 6 [IU] via SUBCUTANEOUS

## 2020-09-10 MED ORDER — INSULIN ASPART 100 UNIT/ML ~~LOC~~ SOLN
0.0000 [IU] | Freq: Every day | SUBCUTANEOUS | Status: DC
  Administered 2020-09-10: 2 [IU] via SUBCUTANEOUS
  Administered 2020-09-14: 4 [IU] via SUBCUTANEOUS

## 2020-09-10 MED ORDER — INSULIN ASPART 100 UNIT/ML ~~LOC~~ SOLN
0.0000 [IU] | Freq: Three times a day (TID) | SUBCUTANEOUS | Status: DC
  Administered 2020-09-10: 8 [IU] via SUBCUTANEOUS
  Administered 2020-09-10 – 2020-09-11 (×2): 15 [IU] via SUBCUTANEOUS
  Administered 2020-09-11: 2 [IU] via SUBCUTANEOUS
  Administered 2020-09-12: 5 [IU] via SUBCUTANEOUS
  Administered 2020-09-13: 8 [IU] via SUBCUTANEOUS
  Administered 2020-09-13: 5 [IU] via SUBCUTANEOUS
  Administered 2020-09-14 (×2): 3 [IU] via SUBCUTANEOUS
  Administered 2020-09-14: 8 [IU] via SUBCUTANEOUS
  Administered 2020-09-15: 5 [IU] via SUBCUTANEOUS

## 2020-09-10 MED ORDER — DOCUSATE SODIUM 50 MG/5ML PO LIQD: 100.0000 mg | Freq: Every day | ORAL | Status: DC | PRN

## 2020-09-10 MED ORDER — LEVOTHYROXINE SODIUM 50 MCG PO TABS
50.0000 ug | ORAL_TABLET | Freq: Every day | ORAL | Status: DC
  Administered 2020-09-11 – 2020-09-30 (×17): 50 ug via ORAL
  Filled 2020-09-10 (×20): qty 1

## 2020-09-10 MED ORDER — PANTOPRAZOLE SODIUM 40 MG PO TBEC
40.0000 mg | DELAYED_RELEASE_TABLET | Freq: Every day | ORAL | Status: DC
  Administered 2020-09-10 – 2020-09-30 (×20): 40 mg via ORAL
  Filled 2020-09-10 (×21): qty 1

## 2020-09-10 MED ORDER — ATORVASTATIN CALCIUM 10 MG PO TABS
20.0000 mg | ORAL_TABLET | Freq: Every day | ORAL | Status: DC
  Administered 2020-09-10 – 2020-10-01 (×22): 20 mg via ORAL
  Filled 2020-09-10 (×21): qty 2

## 2020-09-10 NOTE — TOC Initial Note (Addendum)
Transition of Care [facility]) - Initial/Assessment Note    Patient Details  Name: Mark [name] MRN: [id number] Date of Birth: [birth year]  Transition of [facility]) CM/SW Contact:    Lawerance Sabal, RN Phone Number: 09/10/2020, 3:23 PM  Clinical Narrative:                Referral placed to [facility] to assess for possible charity HH PT OT ST.  Services will be pending their approval. TOC will also follow for medication assistance at DC.  15:50 [facility] declined HH services due to staffing      Barriers to Discharge: Inadequate or no insurance   Patient Goals and CMS Choice        Expected Discharge Plan and Services                                     HH Arranged: PT, OT, Speech Therapy HH Agency: [facility] Date HH Agency Contacted: 09/10/20 Time HH Agency Contacted: 1523 Representative spoke with at [name] Agency: [name]  Prior Living Arrangements/Services                       Activities of Daily Living      Permission Sought/Granted                  Emotional Assessment              Admission diagnosis:  Status epilepticus (HCC) [G40.901] SIRS (systemic inflammatory response syndrome) (HCC) [R65.10] Altered mental status, unspecified altered mental status type [R41.82] Type 1 diabetes mellitus with ketoacidosis without coma (HCC) [E10.10] AMS (altered mental status) [R41.82] Patient Active Problem List   Diagnosis Date Noted  . AMS (altered mental status) 09/02/2020  . Seizures (HCC)   . Leukocytosis 06/22/2020  . Hyperkalemia 04/10/2020  . AKI (acute kidney injury) (HCC) 04/10/2020  . Left leg cellulitis 03/23/2020  . Bipolar I disorder, current or most recent episode depressed, in partial remission (HCC)   . MDD (major depressive disorder), recurrent episode, severe (HCC) 03/13/2020  . Drug overdose, intentional (HCC) 03/05/2020  . Drug overdose 03/05/2020  . Drug abuse (HCC) 03/05/2020  . Attempted suicide (HCC)  03/05/2020  . Insulin overdose   . Hypoglycemia 02/03/2020  . Hypothyroidism 02/02/2020  . Diabetic polyneuropathy associated with type 1 diabetes mellitus (HCC) 02/02/2020  . Type 1 diabetes mellitus with hyperosmolar hyperglycemic state (HHS) (HCC) 12/30/2019  . DKA (diabetic ketoacidosis) (HCC) 12/29/2019  . Homelessness 12/29/2019  . Marijuana abuse 12/29/2019  . Tobacco dependence 12/29/2019  . Seizure disorder (HCC) 12/29/2019  . Suicidal ideation   . Bipolar I disorder, most recent episode depressed, severe without psychotic features (HCC)   . Adjustment disorder with depressed mood 12/07/2017  . Noncompliance 10/17/2017  . Dysthymia 10/08/2017  . 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 Obstructive hydrocephalus (HCC) 09/19/2017  . MDD (major depressive disorder) 08/22/2017  . Severe recurrent major depression with psychotic features (HCC) 08/19/2017  . Abdominal pain 08/18/2017  . Hyponatremia 08/18/2017  . DKA (diabetic ketoacidoses) 11/24/2015  . Diabetic keto-acidosis (HCC) 10/23/2015   PCP:  Patient, No Pcp Per Pharmacy:   [name] [place], [place] - [address] [place] [address] Phone: [phone] Fax: [phone]     Social Determinants of Health (SDOH) Interventions  Readmission Risk Interventions Readmission Risk Prevention Plan 03/25/2020  Transportation Screening Complete  Medication Review [employer]) Complete  PCP or Specialist appointment within 3-5 days of discharge Complete  HRI or Home Care Consult (No Data)  SW Recovery Care/Counseling Consult Complete  Palliative Care Screening Not Applicable  Skilled Nursing Facility Not Applicable  Some recent data might be hidden

## 2020-09-10 NOTE — Progress Notes (Signed)
PROGRESS NOTE  [name] ERX:[id number] DOB: 12-03-1985 DOA: 09/02/2020 PCP: Patient, No Pcp Per   LOS: 8 days   Brief narrative:  Patient is a 34 years old male with past medical history of diabetes mellitus type 1, anxiety, bipolar disorder, depression, seizure disorder, history of hydrocephalus chronic smoker presented to hospital with altered mental status.  He was noted to be in diabetic ketoacidosis and was intubated on 09/02/2020 for airway protection and concerns for aspiration.  CT scan of the head showed marked hydrocephalus with acute aqueduct stenosis and right frontal ventriculostomy site with drain.  He was started on insulin drip.  Neurology was consulted.  He was extubated on 09/04/2020 and was considered stable for transfer to medical floor.  Patient was also started on vancomycin and Rocephin IV during hospitalization.  Assessment/Plan:  Principal Problem:   AMS (altered mental status) Active Problems:   Diabetic keto-acidosis (HCC)   Obstructive hydrocephalus (HCC)   Bipolar I disorder, most recent episode depressed, severe without psychotic features (HCC)  Acute hypoxic respiratory failure with compromised airway and aspiration pneumonitis improved at this time.  Status post intubation and mechanical ventilation.  Currently  on room air.  Patient completed course of IV Rocephin and vancomycin.  Blood cultures negative in 5 days.  DKA with poorly controlled DM type 1 with hyperglycemia. DKA has resolved at this time. Seen by speech and swallow who has recommended dysphagia 3 diet.  Currently on Levemir, NovoLog and  sliding scale insulin.  Diabetic coordinator on board.  Will increase Levemir to 15 units twice a day, NovoLog 3 times daily with meals and moderate scale sliding scale insulin.  Patient takes total of 70 units of insulin at home.  Hemoglobin A1c of 8.2 on 06/21/2020.   Metabolic encephalopathy with chronic hydrocephalus. Hx of Bipolar, depression,  anxiety. On Depakote Lexapro at home.  Seroquel was initiated yesterday.  Gabapentin is still on hold.  Patient was seen by neurology.  Patient does have chronic hydrocephalus.  CT head with marked hydrocephalus involving third and lateral ventricles consistent with aqueductal stenosis. .  I had spoken with neurosurgery on-call Dr. Franky Macho regarding the CT scan findings.  The impression is that CT scans have remained stable  and there was no need for further surgical intervention.  We will consult speech therapy for cognitive and language evaluation.  Dysphagia following intubation. Speech and swallow on board.  Advance diet as per speech therapy.  Continue Protonix.  Continue with dysphagia 3 diet at this time.  Advance as tolerated  Hypokalemia, hypomagnesemia, hypophosphatemia. Improved with replacement.    Anemia of chronic disease. No evidence of bleeding.  Latest hemoglobin of 11.8.  Homelessness. Patient however states that he lives with his friend [name] currently.  History of hyperlipidemia  Continue Lipitor  Hypothyroidism. Continue Synthroid.  TSH was elevated at 14.4.  Will need to monitor closely as outpatient.  Possibility of noncompliance.    Deconditioning, debility with right-sided weakness. PT, OT  on board.  Recommended CIR, CIR on board.  Recommend continuation of physical therapy and reassessment on Monday.   DVT prophylaxis: enoxaparin (LOVENOX) injection 40 mg Start: 09/03/20 0945 SCDs Start: 09/02/20 0844    Code Status: Full code  Family Communication: spoke with Mr [name] on the phone, patient's friend.  He states that he is willing to help him when he comes home.   Status is: Inpatient  Remains inpatient appropriate because:IV treatments appropriate due to intensity of illness or inability to take  PO and Inpatient level of care appropriate due to severity of illness   Dispo: The patient is from: Home              Anticipated d/c is to: Possible home  health.  PT recommends CIR.  Notified transition of care for assistance with transition.              Anticipated d/c date is: 1-2 days               Patient currently is  medically stable to d/c.   Consultants:  PCCM  Procedures:  Intubation and subsequent extubation  Antibiotics:  . None at this time  Subjective: Today, patient was seen and examined at bedside.  Patient states that he wants to go home.  If not completely aware of his deficits/ weakness.   Objective: Vitals:   09/09/20 2002 09/10/20 0437  BP: 127/80 109/67  Pulse: 85 86  Resp: 18 18  Temp: 98.6 F (37 C) 98.3 F (36.8 C)  SpO2: 96% 95%    Intake/Output Summary (Last 24 hours) at 09/10/2020 0751 Last data filed at 09/10/2020 0641 Gross per 24 hour  Intake 720 ml  Output 2050 ml  Net -1330 ml   Filed Weights   09/07/20 0500 09/08/20 0500 09/09/20 0453  Weight: 103 kg 102.2 kg 97.4 kg   Body mass index is 27.57 kg/m.   Physical Exam:  General:  Average built, alert awake and communicative but confused at times HENT:   No scleral pallor or icterus noted. Oral mucosa is moist.  Chest: Clear breath sounds noted bilateral. CVS: S1 &S2 heard. No murmur.  Regular rate and rhythm. Abdomen: Soft, nontender, nondistended.  Bowel sounds are heard.   Extremities: No cyanosis, clubbing or edema.  Peripheral pulses are palpable. Psych: Alert, awake and communicative,  CNS:  No cranial nerve deficits.  Moving all extremities.  Appears to be confused at times.  Right sided weakness noted Skin: Warm and dry.  No rashes noted.   Data Review: I have personally reviewed the following laboratory data and studies,  CBC: Recent Labs  Lab 09/04/20 0226 09/06/20 0352 09/10/20 0229  WBC 13.7* 9.5 9.7  HGB 10.8* 9.7* 11.8*  HCT 32.5* 29.1* 35.3*  MCV 93.9 92.7 89.6  PLT 189 190 344   Basic Metabolic Panel: Recent Labs  Lab 09/03/20 1632 09/04/20 0226 09/04/20 0226 09/05/20 0657 09/05/20 0657  09/06/20 0352 09/06/20 1304 09/07/20 0105 09/08/20 0327 09/10/20 0229  NA  --  139   < > 137   < > 142 141 144 142 135  K  --  3.6   < > 3.5   < > 3.3* 3.9 3.7 3.9 4.6  CL  --  107   < > 106   < > 107 102 108 107 101  CO2  --  26   < > 23   < > GLUCOSE  --  177*   < > 246*   < > 221* 299* 223* 205* 418*  BUN  --  8   < > 9   < > CREATININE  --  0.89   < > 0.78   < > 0.83 0.85 0.79 0.82 0.92  CALCIUM  --  8.8*   < > 8.4*   < > 8.6* 9.1 9.2 9.2 9.2  MG 1.5* 1.5*  --  1.7  --   --   --  1.7  --  1.7  PHOS 1.7* 1.9*  --  2.3*  --   --   --  2.7  --   --    < > = values in this interval not displayed.   Liver Function Tests: Recent Labs  Lab 09/10/20 0229  AST 17  ALT 21  ALKPHOS 63  BILITOT 0.5  PROT 6.4*  ALBUMIN 2.9*   No results for input(s): LIPASE, AMYLASE in the last 168 hours. No results for input(s): AMMONIA in the last 168 hours. Cardiac Enzymes: No results for input(s): CKTOTAL, CKMB, CKMBINDEX, TROPONINI in the last 168 hours. BNP (last 3 results) No results for input(s): BNP in the last 8760 hours.  ProBNP (last 3 results) No results for input(s): PROBNP in the last 8760 hours.  CBG: Recent Labs  Lab 09/09/20 0730 09/09/20 1142 09/09/20 1606 09/09/20 2103 09/10/20 0627  GLUCAP 169* 342* 289* 358* 398*   Recent Results (from the past 240 hour(s))  Respiratory Panel by RT PCR (Flu A&B, Covid) - Nasopharyngeal Swab     Status: None   Collection Time: 09/02/20  8:04 AM   Specimen: Nasopharyngeal Swab  Result Value Ref Range Status   SARS Coronavirus 2 by RT PCR NEGATIVE NEGATIVE Final    Comment: (NOTE) SARS-CoV-2 target nucleic acids are NOT DETECTED.  The SARS-CoV-2 RNA is generally detectable in upper respiratoy specimens during the acute phase of infection. The lowest concentration of SARS-CoV-2 viral copies this assay can detect is 131 copies/mL. A negative result does not preclude SARS-Cov-2 infection and should not  be used as the sole basis for treatment or other patient management decisions. A negative result may occur with  improper specimen collection/handling, submission of specimen other than nasopharyngeal swab, presence of viral mutation(s) within the areas targeted by this assay, and inadequate number of viral copies (<131 copies/mL). A negative result must be combined with clinical observations, patient history, and epidemiological information. The expected result is Negative.  Fact Sheet for Patients:  https://www.moore.com/  Fact Sheet for Healthcare Providers:  https://www.young.biz/  This test is no t yet approved or cleared by the [place] FDA and  has been authorized for detection and/or diagnosis of SARS-CoV-2 by FDA under an Emergency Use Authorization (EUA). This EUA will remain  in effect (meaning this test can be used) for the duration of the COVID-19 declaration under Section 564(b)(1) of the Act, 21 U.S.C. section 360bbb-3(b)(1), unless the authorization is terminated or revoked sooner.     Influenza A by PCR NEGATIVE NEGATIVE Final   Influenza B by PCR NEGATIVE NEGATIVE Final    Comment: (NOTE) The Xpert Xpress SARS-CoV-2/FLU/RSV assay is intended as an aid in  the diagnosis of influenza from Nasopharyngeal swab specimens and  should not be used as a sole basis for treatment. Nasal washings and  aspirates are unacceptable for Xpert Xpress SARS-CoV-2/FLU/RSV  testing.  Fact Sheet for Patients: https://www.moore.com/  Fact Sheet for Healthcare Providers: https://www.young.biz/  This test is not yet approved or cleared by the [place] FDA and  has been authorized for detection and/or diagnosis of SARS-CoV-2 by  FDA under an Emergency Use Authorization (EUA). This EUA will remain  in effect (meaning this test can be used) for the duration of the  Covid-19 declaration under Section  564(b)(1) of the Act, 21  U.S.C. section 360bbb-3(b)(1), unless the authorization is  terminated or revoked. Performed at [facility], [address] [place], [place] [address]   Blood  culture (routine x 2)     Status: None   Collection Time: 09/02/20  8:48 AM   Specimen: BLOOD  Result Value Ref Range Status   Specimen Description BLOOD SITE NOT SPECIFIED  Final   Special Requests   Final    BOTTLES DRAWN AEROBIC AND ANAEROBIC Blood Culture results may not be optimal due to an excessive volume of blood received in culture bottles   Culture   Final    NO GROWTH 5 DAYS Performed at [facility], [address] [place], [place] [address]    Report Status 09/07/2020 FINAL  Final  Blood culture (routine x 2)     Status: None   Collection Time: 09/02/20  8:59 AM   Specimen: BLOOD  Result Value Ref Range Status   Specimen Description BLOOD SITE NOT SPECIFIED  Final   Special Requests   Final    BOTTLES DRAWN AEROBIC AND ANAEROBIC Blood Culture adequate volume   Culture   Final    NO GROWTH 5 DAYS Performed at [facility], [address] [place], [place] [address]    Report Status 09/07/2020 FINAL  Final  Urine culture     Status: None   Collection Time: 09/02/20 10:49 AM   Specimen: Urine, Random  Result Value Ref Range Status   Specimen Description URINE, RANDOM  Final   Special Requests NONE  Final   Culture   Final    NO GROWTH Performed at [facility], [address] [place], [place] [address]    Report Status 09/03/2020 FINAL  Final  MRSA PCR Screening     Status: None   Collection Time: 09/02/20 12:06 PM   Specimen: Nasal Mucosa; Nasopharyngeal  Result Value Ref Range Status   MRSA by PCR NEGATIVE NEGATIVE Final    Comment:        The GeneXpert MRSA Assay (FDA approved for NASAL specimens only), is one component of a comprehensive MRSA colonization surveillance program. It is not intended to diagnose MRSA infection nor to guide  or monitor treatment for MRSA infections. Performed at [facility], [address] [place], [place] [address]   Culture, blood (routine x 2)     Status: None   Collection Time: 09/02/20  1:29 PM   Specimen: BLOOD  Result Value Ref Range Status   Specimen Description BLOOD RIGHT ANTECUBITAL  Final   Special Requests   Final    BOTTLES DRAWN AEROBIC AND ANAEROBIC Blood Culture adequate volume   Culture   Final    NO GROWTH 5 DAYS Performed at [facility], [address], [place], [place] [address]    Report Status 09/07/2020 FINAL  Final  Culture, blood (routine x 2)     Status: None   Collection Time: 09/02/20  1:29 PM   Specimen: BLOOD LEFT HAND  Result Value Ref Range Status   Specimen Description BLOOD LEFT HAND  Final   Special Requests   Final    BOTTLES DRAWN AEROBIC AND ANAEROBIC Blood Culture adequate volume   Culture   Final    NO GROWTH 5 DAYS Performed at [facility], [address] [place], [place] [address]    Report Status 09/07/2020 FINAL  Final  Culture, respiratory (tracheal aspirate)     Status: None   Collection Time: 09/06/20  2:41 PM   Specimen: Tracheal Aspirate; Respiratory  Result Value Ref Range Status   Specimen Description TRACHEAL ASPIRATE  Final   Special Requests NONE  Final   Gram Stain   Final    RARE WBC PRESENT, PREDOMINANTLY MONONUCLEAR NO ORGANISMS SEEN    Culture   Final    FEW Normal respiratory flora-no Staph aureus or Pseudomonas seen Performed at [facility], [address] [place], [place] [address]    Report Status 09/08/2020 FINAL  Final     Studies: DG Swallowing Func-Speech Pathology  Result Date: 09/08/2020 Objective Swallowing Evaluation: Type of Study: MBS-Modified Barium Swallow Study  Patient Details Name: Mark [name] MRN: [id number] Date of Birth: 06/19/1986 Today's Date: 09/08/2020 Time: SLP Start Time (ACUTE ONLY): 1212 -SLP Stop Time (ACUTE ONLY): 1226 SLP Time Calculation (min)  (ACUTE ONLY): 14 min Past Medical History: Past Medical History: Diagnosis Date . Anxiety  . Bipolar disorder (HCC)  . Depression  . Diabetes mellitus without complication (HCC)  . 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 Hydrocephalus (HCC)  . Kidney stones  . Seizures (HCC)  Past Surgical History: Past Surgical History: Procedure Laterality Date . KIDNEY STONE SURGERY   HPI: Pt is a 34 yo male admitted with AMS, sonorous respirations, and concern for aspiration in the setting of DKA. Pt was intubated 11/12; self-extubated 11/16. PMH includes: DM type 1, anxiety, bipolar, depression, DM neuropathy, nephrolithiasis, seizures, hydrocephalus  Subjective: pt is eager for POs Assessment / Plan / Recommendation CHL IP CLINICAL IMPRESSIONS 09/08/2020 Clinical Impression Pt's oropharyngeal swallow appeared to be functional until he started to consume large, sequential boluses of thin liquids via straw. Given reduced coordination and likely reduced glottic closure, pt had aspiration that was silent in nature. He had much better coordination with thin liquids via cup sips, suspect in part because he requires assist for feeding and therefore SLP could better control his rate. Recommend starting mechanical soft diet in light of dentition with thin liquids via single cup sips. Pt would benefit from full supervision and medications whole in puree. Will f/u for tolerance, with swallowing suspected to continue to improve with more time post-extubation.  SLP Visit Diagnosis Dysphagia, unspecified (R13.10) Attention and concentration deficit following -- Frontal lobe and executive function deficit following -- Impact on safety and function Mild aspiration risk   CHL IP TREATMENT RECOMMENDATION 09/08/2020 Treatment Recommendations Therapy as outlined in treatment plan below   Prognosis 09/08/2020 Prognosis for Safe Diet Advancement Good Barriers to Reach Goals -- Barriers/Prognosis Comment -- CHL IP  DIET RECOMMENDATION 09/08/2020 SLP Diet Recommendations Dysphagia 3 (Mech soft) solids;Thin liquid Liquid Administration via Cup;No straw Medication Administration Whole meds with puree Compensations Minimize environmental distractions;Slow rate Postural Changes Seated upright at 90 degrees   CHL IP OTHER RECOMMENDATIONS 09/08/2020 Recommended Consults -- Oral Care Recommendations Oral care BID Other Recommendations --   CHL IP FOLLOW UP RECOMMENDATIONS 09/08/2020 Follow up Recommendations Inpatient Rehab   CHL IP FREQUENCY AND DURATION 09/08/2020 Speech Therapy Frequency (ACUTE ONLY) min 2x/week Treatment Duration 2 weeks      CHL IP ORAL PHASE 09/08/2020 Oral Phase WFL Oral - Pudding Teaspoon -- Oral - Pudding Cup -- Oral - Honey Teaspoon -- Oral - Honey Cup -- Oral - Nectar Teaspoon -- Oral - Nectar Cup -- Oral - Nectar Straw -- Oral - Thin Teaspoon -- Oral - Thin Cup -- Oral - Thin Straw -- Oral - Puree -- Oral - Mech Soft -- Oral - Regular -- Oral - Multi-Consistency -- Oral - Pill -- Oral Phase - Comment --  CHL  IP PHARYNGEAL PHASE 09/08/2020 Pharyngeal Phase Impaired Pharyngeal- Pudding Teaspoon -- Pharyngeal -- Pharyngeal- Pudding Cup -- Pharyngeal -- Pharyngeal- Honey Teaspoon -- Pharyngeal -- Pharyngeal- Honey Cup -- Pharyngeal -- Pharyngeal- Nectar Teaspoon -- Pharyngeal -- Pharyngeal- Nectar Cup -- Pharyngeal -- Pharyngeal- Nectar Straw -- Pharyngeal -- Pharyngeal- Thin Teaspoon -- Pharyngeal -- Pharyngeal- Thin Cup WFL Pharyngeal -- Pharyngeal- Thin Straw Penetration/Aspiration before swallow;Reduced airway/laryngeal closure Pharyngeal Material enters airway, passes BELOW cords without attempt by patient to eject out (silent aspiration) Pharyngeal- Puree WFL Pharyngeal -- Pharyngeal- Mechanical Soft WFL Pharyngeal -- Pharyngeal- Regular -- Pharyngeal -- Pharyngeal- Multi-consistency -- Pharyngeal -- Pharyngeal- Pill -- Pharyngeal -- Pharyngeal Comment --  CHL IP CERVICAL ESOPHAGEAL PHASE 09/08/2020  Cervical Esophageal Phase WFL Pudding Teaspoon -- Pudding Cup -- Honey Teaspoon -- Honey Cup -- Nectar Teaspoon -- Nectar Cup -- Nectar Straw -- Thin Teaspoon -- Thin Cup -- Thin Straw -- Puree -- Mechanical Soft -- Regular -- Multi-consistency -- Pill -- Cervical Esophageal Comment -- [name]., M.A. CCC-SLP Acute Rehabilitation Services Pager [phone] Office [phone] 09/08/2020, 1:18 PM                 Joycelyn Das, MD  [facility] 09/10/2020

## 2020-09-10 NOTE — Plan of Care (Signed)
  Problem: Health Behavior/Discharge Planning: Goal: Ability to manage health-related needs will improve Outcome: Progressing   Problem: Clinical Measurements: Goal: Ability to maintain clinical measurements within normal limits will improve Outcome: Progressing Goal: Will remain free from infection Outcome: Progressing Goal: Diagnostic test results will improve Outcome: Progressing Goal: Respiratory complications will improve Outcome: Progressing Goal: Cardiovascular complication will be avoided Outcome: Progressing   Problem: Nutrition: Goal: Adequate nutrition will be maintained Outcome: Progressing   Problem: Elimination: Goal: Will not experience complications related to bowel motility Outcome: Progressing Goal: Will not experience complications related to urinary retention Outcome: Progressing   Problem: Education: Goal: Knowledge of General Education information will improve Description: Including pain rating scale, medication(s)/side effects and non-pharmacologic comfort measures Outcome: Progressing   Problem: Health Behavior/Discharge Planning: Goal: Ability to manage health-related needs will improve Outcome: Progressing   Problem: Clinical Measurements: Goal: Ability to maintain clinical measurements within normal limits will improve Outcome: Progressing Goal: Will remain free from infection Outcome: Progressing Goal: Diagnostic test results will improve Outcome: Progressing Goal: Respiratory complications will improve Outcome: Progressing Goal: Cardiovascular complication will be avoided Outcome: Progressing   Problem: Activity: Goal: Risk for activity intolerance will decrease Outcome: Progressing   Problem: Nutrition: Goal: Adequate nutrition will be maintained Outcome: Progressing   Problem: Elimination: Goal: Will not experience complications related to bowel motility Outcome: Progressing Goal: Will not experience complications related to  urinary retention Outcome: Progressing   Problem: Pain Managment: Goal: General experience of comfort will improve Outcome: Progressing

## 2020-09-11 LAB — BASIC METABOLIC PANEL
Anion gap: 8 (ref 5–15)
BUN: 14 mg/dL (ref 6–20)
CO2: 25 mmol/L (ref 22–32)
Calcium: 9.3 mg/dL (ref 8.9–10.3)
Chloride: 103 mmol/L (ref 98–111)
Creatinine, Ser: 0.94 mg/dL (ref 0.61–1.24)
GFR, Estimated: >60 mL/min (ref >60)
Glucose, Bld: 326 mg/dL — ABNORMAL HIGH (ref 70–99)
Potassium: 4.2 mmol/L (ref 3.5–5.1)
Sodium: 136 mmol/L (ref 135–145)

## 2020-09-11 LAB — MAGNESIUM: Magnesium: 1.7 mg/dL (ref 1.7–2.4)

## 2020-09-11 LAB — GLUCOSE, CAPILLARY
Glucose-Capillary: 335 mg/dL — ABNORMAL HIGH (ref 70–99)
Glucose-Capillary: 433 mg/dL — ABNORMAL HIGH (ref 70–99)
Glucose-Capillary: 468 mg/dL — ABNORMAL HIGH (ref 70–99)
Glucose-Capillary: 398 mg/dL — ABNORMAL HIGH (ref 70–99)
Glucose-Capillary: 175 mg/dL — ABNORMAL HIGH (ref 70–99)
Glucose-Capillary: 128 mg/dL — ABNORMAL HIGH (ref 70–99)
Glucose-Capillary: 107 mg/dL — ABNORMAL HIGH (ref 70–99)

## 2020-09-11 LAB — CBC
HCT: 35 % — ABNORMAL LOW (ref 39.0–52.0)
Hemoglobin: 11.8 g/dL — ABNORMAL LOW (ref 13.0–17.0)
MCH: 30 pg (ref 26.0–34.0)
MCHC: 33.7 g/dL (ref 30.0–36.0)
MCV: 89.1 fL (ref 80.0–100.0)
Platelets: 383 10*3/uL (ref 150–400)
RBC: 3.93 MIL/uL — ABNORMAL LOW (ref 4.22–5.81)
RDW: 12.3 % (ref 11.5–15.5)
WBC: 10.1 10*3/uL (ref 4.0–10.5)
nRBC: 0 % (ref 0.0–0.2)

## 2020-09-11 LAB — GLUCOSE, RANDOM: Glucose, Bld: 588 mg/dL (ref 70–99)

## 2020-09-11 MED ORDER — INSULIN DETEMIR 100 UNIT/ML ~~LOC~~ SOLN
18.0000 [IU] | Freq: Two times a day (BID) | SUBCUTANEOUS | Status: DC
  Filled 2020-09-11 (×2): qty 0.18

## 2020-09-11 MED ORDER — DIVALPROEX SODIUM 250 MG PO DR TAB
750.0000 mg | DELAYED_RELEASE_TABLET | Freq: Every evening | ORAL | Status: DC
  Administered 2020-09-11 – 2020-09-30 (×20): 750 mg via ORAL
  Filled 2020-09-11 (×20): qty 3

## 2020-09-11 MED ORDER — INSULIN ASPART 100 UNIT/ML ~~LOC~~ SOLN
18.0000 [IU] | Freq: Once | SUBCUTANEOUS | Status: AC
  Administered 2020-09-11: 18 [IU] via SUBCUTANEOUS

## 2020-09-11 MED ORDER — INSULIN DETEMIR 100 UNIT/ML ~~LOC~~ SOLN
20.0000 [IU] | Freq: Two times a day (BID) | SUBCUTANEOUS | Status: DC
  Administered 2020-09-11: 20 [IU] via SUBCUTANEOUS
  Filled 2020-09-11 (×2): qty 0.2

## 2020-09-11 MED ORDER — ESCITALOPRAM OXALATE 10 MG PO TABS
20.0000 mg | ORAL_TABLET | Freq: Every day | ORAL | Status: DC
  Administered 2020-09-11 – 2020-10-01 (×21): 20 mg via ORAL
  Filled 2020-09-11 (×21): qty 2

## 2020-09-11 MED ORDER — INSULIN DETEMIR 100 UNIT/ML ~~LOC~~ SOLN
10.0000 [IU] | Freq: Once | SUBCUTANEOUS | Status: AC
  Administered 2020-09-11: 10 [IU] via SUBCUTANEOUS
  Filled 2020-09-11: qty 0.1

## 2020-09-11 MED ORDER — DIVALPROEX SODIUM 250 MG PO DR TAB
500.0000 mg | DELAYED_RELEASE_TABLET | Freq: Every day | ORAL | Status: DC
  Administered 2020-09-11 – 2020-10-01 (×21): 500 mg via ORAL
  Filled 2020-09-11 (×21): qty 2

## 2020-09-11 MED ORDER — INSULIN DETEMIR 100 UNIT/ML ~~LOC~~ SOLN
35.0000 [IU] | Freq: Two times a day (BID) | SUBCUTANEOUS | Status: DC
  Administered 2020-09-11: 35 [IU] via SUBCUTANEOUS
  Filled 2020-09-11 (×3): qty 0.35

## 2020-09-11 NOTE — Evaluation (Signed)
Speech Language Pathology Evaluation Patient Details Name: Mark [name] MRN: [id number] DOB: 1985-12-28 Today's Date: 09/11/2020 Time: 0174-9449 SLP Time Calculation (min) (ACUTE ONLY): 15 min  Problem List:  Patient Active Problem List   Diagnosis Date Noted  . AMS (altered mental status) 09/02/2020  . Seizures (HCC)   . Leukocytosis 06/22/2020  . Hyperkalemia 04/10/2020  . AKI (acute kidney injury) (HCC) 04/10/2020  . Left leg cellulitis 03/23/2020  . Bipolar I disorder, current or most recent episode depressed, in partial remission (HCC)   . MDD (major depressive disorder), recurrent episode, severe (HCC) 03/13/2020  . Drug overdose, intentional (HCC) 03/05/2020  . Drug overdose 03/05/2020  . Drug abuse (HCC) 03/05/2020  . Attempted suicide (HCC) 03/05/2020  . Insulin overdose   . Hypoglycemia 02/03/2020  . Hypothyroidism 02/02/2020  . Diabetic polyneuropathy associated with type 1 diabetes mellitus (HCC) 02/02/2020  . Type 1 diabetes mellitus with hyperosmolar hyperglycemic state (HHS) (HCC) 12/30/2019  . DKA (diabetic ketoacidosis) (HCC) 12/29/2019  . Homelessness 12/29/2019  . Marijuana abuse 12/29/2019  . Tobacco dependence 12/29/2019  . Seizure disorder (HCC) 12/29/2019  . Suicidal ideation   . Bipolar I disorder, most recent episode depressed, severe without psychotic features (HCC)   . Adjustment disorder with depressed mood 12/07/2017  . Noncompliance 10/17/2017  . Dysthymia 10/08/2017  . Personality disorder in adult [facility]) 09/26/2017  . 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 Obstructive hydrocephalus (HCC) 09/19/2017  . MDD (major depressive disorder) 08/22/2017  . Severe recurrent major depression with psychotic features (HCC) 08/19/2017  . Abdominal pain 08/18/2017  . Hyponatremia 08/18/2017  . DKA (diabetic ketoacidoses) 11/24/2015  . Diabetic keto-acidosis (HCC) 10/23/2015   Past Medical History:  Past Medical History:  Diagnosis Date  . Anxiety   . Bipolar  disorder (HCC)   . Depression   . Diabetes mellitus without complication (HCC)   . Diabetic polyneuropathy associated with type 1 diabetes mellitus (HCC)   . Hydrocephalus (HCC)   . Kidney stones   . Seizures (HCC)    Past Surgical History:  Past Surgical History:  Procedure Laterality Date  . KIDNEY STONE SURGERY     HPI:  Pt is a 34 yo male admitted with AMS, sonorous respirations, and concern for aspiration in the setting of DKA. Pt was intubated 11/12; self-extubated 11/16. PMH includes: DM type 1, anxiety, bipolar, depression, DM neuropathy, nephrolithiasis, seizures, hydrocephalus   Assessment / Plan / Recommendation Clinical Impression  Patient presents with a moderate-severe cognitive impairment with likely impact from h/o psychiatric disorders. Patient with very poor delayed recall, but approximate orientation to time (didnt know day of week, but correct for year, month and approximate date), limited attention and although able to follow some basic level directions, he did not appear to fully engage in tasks that required some more cognitive processing. For example after naming 9 animals (task of naming animals in one minute) he said, "thats about all I know" and would not attempt to think of more. Patient does not demonstrate adequate awareness to his deficits and he is adamant about wanting to leave. In light of his psychiatric disorders, he will likely become increasingly agitated the longer he is in the hospital.    SLP Assessment  SLP Recommendation/Assessment: Patient needs continued Speech Lanaguage Pathology Services SLP Visit Diagnosis: Cognitive communication deficit (R41.841)    Follow Up Recommendations  None    Frequency and Duration min 1 x/week         SLP Evaluation Cognition  Overall Cognitive  Status: No family/caregiver present to determine baseline cognitive functioning Arousal/Alertness: Awake/alert Orientation Level: Oriented to person;Oriented to  place;Disoriented to time;Oriented to situation Attention: Sustained Sustained Attention: Impaired Sustained Attention Impairment: Verbal basic;Functional basic Memory: Impaired Memory Impairment: Other (comment) (recalled 1/5 words after 3 minute delay) Awareness: Impaired Awareness Impairment: Emergent impairment Problem Solving: Impaired Problem Solving Impairment: Verbal basic Behaviors: Impulsive;Verbal agitation Safety/Judgment: Impaired       Comprehension  Auditory Comprehension Overall Auditory Comprehension: Appears within functional limits for tasks assessed    Expression Expression Primary Mode of Expression: Verbal Verbal Expression Overall Verbal Expression: Appears within functional limits for tasks assessed   Oral / Motor  Oral Motor/Sensory Function Overall Oral Motor/Sensory Function: Within functional limits   GO                    Angela Nevin, MA, CCC-SLP Speech Therapy [facility]

## 2020-09-11 NOTE — Progress Notes (Signed)
Patient has been agitated and aggressive towards staff throughout the shift. MD and charge RN aware. Multiple attempts made to calm patient prior to escalation. Security called to talk to pt at bedside. Pt refusing cardiac telemetry monitoring. MD made aware.

## 2020-09-11 NOTE — Plan of Care (Signed)
Problem: Health Behavior/Discharge Planning: Goal: Ability to manage health-related needs will improve 09/11/2020 2003 by Merrilee Seashore, RN Outcome: Not Progressing 09/11/2020 2003 by Merrilee Seashore, RN Outcome: Not Progressing   Problem: Clinical Measurements: Goal: Ability to maintain clinical measurements within normal limits will improve 09/11/2020 2003 by Merrilee Seashore, RN Outcome: Not Progressing 09/11/2020 2003 by Merrilee Seashore, RN Outcome: Not Progressing Goal: Will remain free from infection 09/11/2020 2003 by Merrilee Seashore, RN Outcome: Not Progressing 09/11/2020 2003 by Merrilee Seashore, RN Outcome: Not Progressing Goal: Diagnostic test results will improve 09/11/2020 2003 by Merrilee Seashore, RN Outcome: Not Progressing 09/11/2020 2003 by Merrilee Seashore, RN Outcome: Not Progressing Goal: Respiratory complications will improve 09/11/2020 2003 by Merrilee Seashore, RN Outcome: Not Progressing 09/11/2020 2003 by Merrilee Seashore, RN Outcome: Not Progressing Goal: Cardiovascular complication will be avoided 09/11/2020 2003 by Merrilee Seashore, RN Outcome: Not Progressing 09/11/2020 2003 by Merrilee Seashore, RN Outcome: Not Progressing   Problem: Nutrition: Goal: Adequate nutrition will be maintained 09/11/2020 2003 by Merrilee Seashore, RN Outcome: Progressing 09/11/2020 2003 by Merrilee Seashore, RN Outcome: Not Progressing   Problem: Elimination: Goal: Will not experience complications related to bowel motility 09/11/2020 2003 by Merrilee Seashore, RN Outcome: Progressing 09/11/2020 2003 by Merrilee Seashore, RN Outcome: Not Progressing Goal: Will not experience complications related to urinary retention 09/11/2020 2003 by Merrilee Seashore, RN Outcome: Progressing 09/11/2020 2003 by Merrilee Seashore, RN Outcome: Not Progressing   Problem: Education: Goal: Knowledge of General Education information will improve Description: Including pain  rating scale, medication(s)/side effects and non-pharmacologic comfort measures 09/11/2020 2003 by Merrilee Seashore, RN Outcome: Not Progressing 09/11/2020 2003 by Merrilee Seashore, RN Outcome: Not Progressing   Problem: Health Behavior/Discharge Planning: Goal: Ability to manage health-related needs will improve 09/11/2020 2003 by Merrilee Seashore, RN Outcome: Not Progressing 09/11/2020 2003 by Merrilee Seashore, RN Outcome: Not Progressing   Problem: Clinical Measurements: Goal: Ability to maintain clinical measurements within normal limits will improve 09/11/2020 2003 by Merrilee Seashore, RN Outcome: Not Progressing 09/11/2020 2003 by Merrilee Seashore, RN Outcome: Not Progressing Goal: Will remain free from infection 09/11/2020 2003 by Merrilee Seashore, RN Outcome: Not Progressing 09/11/2020 2003 by Merrilee Seashore, RN Outcome: Not Progressing Goal: Diagnostic test results will improve 09/11/2020 2003 by Merrilee Seashore, RN Outcome: Not Progressing 09/11/2020 2003 by Merrilee Seashore, RN Outcome: Not Progressing Goal: Respiratory complications will improve 09/11/2020 2003 by Merrilee Seashore, RN Outcome: Not Progressing 09/11/2020 2003 by Merrilee Seashore, RN Outcome: Not Progressing Goal: Cardiovascular complication will be avoided 09/11/2020 2003 by Merrilee Seashore, RN Outcome: Not Progressing 09/11/2020 2003 by Merrilee Seashore, RN Outcome: Not Progressing   Problem: Activity: Goal: Risk for activity intolerance will decrease 09/11/2020 2003 by Merrilee Seashore, RN Outcome: Not Progressing 09/11/2020 2003 by Merrilee Seashore, RN Outcome: Not Progressing   Problem: Nutrition: Goal: Adequate nutrition will be maintained 09/11/2020 2003 by Merrilee Seashore, RN Outcome: Progressing 09/11/2020 2003 by Merrilee Seashore, RN Outcome: Not Progressing   Problem: Coping: Goal: Level of anxiety will decrease 09/11/2020 2003 by Merrilee Seashore, RN Outcome:  Progressing 09/11/2020 2003 by Merrilee Seashore, RN Outcome: Not Progressing   Problem: Elimination: Goal: Will not experience complications related to bowel motility 09/11/2020 2003 by Merrilee Seashore, RN Outcome: Progressing 09/11/2020 2003 by Merrilee Seashore, RN Outcome: Not Progressing Goal:  Will not experience complications related to urinary retention 09/11/2020 2003 by Merrilee Seashore, RN Outcome: Progressing 09/11/2020 2003 by Merrilee Seashore, RN Outcome: Not Progressing   Problem: Pain Managment: Goal: General experience of comfort will improve 09/11/2020 2003 by Merrilee Seashore, RN Outcome: Progressing 09/11/2020 2003 by Merrilee Seashore, RN Outcome: Not Progressing   Problem: Safety: Goal: Ability to remain free from injury will improve 09/11/2020 2003 by Merrilee Seashore, RN Outcome: Not Progressing 09/11/2020 2003 by Merrilee Seashore, RN Outcome: Not Progressing

## 2020-09-11 NOTE — Progress Notes (Signed)
Pt's blood sugar check this morning was 433. Hyperglycemic protocol initiated. Charge RN made aware, MD stat paged at 0855, stat lab order placed.   0900: MD gave verbal order to give 18 units of NovoLOG insulin stat and 20 units of scheduled Levemir.   0910: 18 units novoLOG insulin given per MD verbal order  Will follow lab result.

## 2020-09-11 NOTE — Progress Notes (Signed)
Consult placed for IV start. Pt has pulled out multiple IVs and does not have any IV meds ordered at this time. Bedside RN to contact MD to get order to leave IV out. IV Team will remain available as needed.

## 2020-09-11 NOTE — Consult Note (Addendum)
[facility] Face-to-Face Psychiatry Consult   Reason for Consult:  Depression and Suicidal Ideaition Referring Physician:  Internal  Med Patient Identification: MAHKI SPIKES MRN:  [id number] Principal Diagnosis: AMS (altered mental status) Diagnosis:  Principal Problem:   AMS (altered mental status) Active Problems:   Diabetic keto-acidosis (HCC)   Obstructive hydrocephalus (HCC)   Bipolar I disorder, most recent episode depressed, severe without psychotic features (HCC)   Total Time spent with patient: 15 minutes  Subjective:   Mark [name] is a 34 y.o. male was evaluated face to face. Patient present flat, guarded but pleasant. He is awake, alert and oriented *3. Reported feeling suicidal today, however denied plan or intent. Reported chronic suicidal ideations.  Stated " I am just ready to go home." patient states " I have been in the hospital for the past month and I am ready to go home, this room is making me more depressed." Patient reported "they wanna do more stuff to keep me here longer."  Mark [name] is not able to explained additional treatment/ procedures that was suggested by treatment team. Mark [name] stated " I don't know what else they want to do to me? I just want to go home. I am a co-owner of my house and feel ready to leave. "  Arhum reported last inpatient admission for mental health was 6 months prior for a reported suicide attempt. Stated that he hasn't follow-up with therapy or psychiatry since his discharge.  Micheal reported history with depression, bipolar and anxiety. Stated he was restarted on medications and has been taken as directed since his admission. Reported that he takes his medications when he has them, but sometimes is unable to afford all of his pills. Micheal is denying any medication side effects currently.   Mark [name] gave verbal authorization to follow-up with is "co-owner" of his house/property. NP spoke to Mark [name] at [phone] for additional  collateral  Information. Mark [name] denied any safety concerns. Denied any guns or weapons in the home. Mark [name] is inquiring about discharge time/date. Will make additional outpatient rescourse available. Patient was made aware of the [facility] location and walk-in appt times.,Patient was receptive to plan.Support, encouragement and reassurances was provided.   HPI:  Per admission assessment note: Patient is a 33 year old male with history of seizures on Depakote, type 1 diabetes with frequent episodes of DKA, hydrocephalus who presents to the emergency department with altered mental status.  Per EMS, patient's roommate called 911.  Reportedly patient was found unresponsive just prior to arrival.  Last seen normal at 3 AM.  Blood glucose in the 500s with EMS.  Patient tachycardic, tachypneic with rhonchorous breath sounds.  Currently on a nonrebreather with a right NPA in place.  No known history of trauma.  Roommate denies history of drug or alcohol abuse.  Past Psychiatric History:  Risk to Self:   Risk to Others:   Prior Inpatient Therapy:   Prior Outpatient Therapy:    Past Medical History:  Past Medical History:  Diagnosis Date  . Anxiety   . Bipolar disorder (HCC)   . Depression   . Diabetes mellitus without complication (HCC)   . Diabetic polyneuropathy associated with type 1 diabetes mellitus (HCC)   . Hydrocephalus (HCC)   . Kidney stones   . Seizures (HCC)     Past Surgical History:  Procedure Laterality Date  . KIDNEY STONE SURGERY     Family History:  Family History  Problem Relation Age of Onset  . Breast cancer Mother  Family Psychiatric  History:  Social History:  Social History   Substance and Sexual Activity  Alcohol Use Yes   Comment: occassional      Social History   Substance and Sexual Activity  Drug Use Yes  . Frequency: 7.0 times per week  . Types: Marijuana   Comment: smoked 2-3 days ago, usually smokes daily    Social History   Socioeconomic History  .  Marital status: Single    Spouse name: Not on file  . Number of children: Not on file  . Years of education: Not on file  . Highest education level: Not on file  Occupational History  . Not on file  Tobacco Use  . Smoking status: Current Every Day Smoker    Packs/day: 1.00    Years: 20.00    Pack years: 20.00    Types: Cigarettes  . Smokeless tobacco: Never Used  Vaping Use  . Vaping Use: Never used  Substance and Sexual Activity  . Alcohol use: Yes    Comment: occassional   . Drug use: Yes    Frequency: 7.0 times per week    Types: Marijuana    Comment: smoked 2-3 days ago, usually smokes daily  . Sexual activity: Not Currently  Other Topics Concern  . Not on file  Social History Narrative  . Not on file   Social Determinants of Health   Financial Resource Strain:   . Difficulty of Paying Living Expenses: Not on file  Food Insecurity:   . Worried About Programme researcher, broadcasting/film/video in the Last Year: Not on file  . Ran Out of Food in the Last Year: Not on file  Transportation Needs:   . Lack of Transportation (Medical): Not on file  . Lack of Transportation (Non-Medical): Not on file  Physical Activity:   . Days of Exercise per Week: Not on file  . Minutes of Exercise per Session: Not on file  Stress:   . Feeling of Stress : Not on file  Social Connections:   . Frequency of Communication with Friends and Family: Not on file  . Frequency of Social Gatherings with Friends and Family: Not on file  . Attends Religious Services: Not on file  . Active Member of Clubs or Organizations: Not on file  . Attends Banker Meetings: Not on file  . Marital Status: Not on file   Additional Social History:    Allergies:   Allergies  Allergen Reactions  . Mushroom Extract Complex Hives, Itching and Nausea And Vomiting  . Penicillins Anaphylaxis, Hives and Swelling    Has patient had a PCN reaction causing immediate rash, facial/tongue/throat swelling, SOB or  lightheadedness with hypotension: Yes Has patient had a PCN reaction causing severe rash involving mucus membranes or skin necrosis: No Has patient had a PCN reaction that required hospitalization: Yes Has patient had a PCN reaction occurring within the last 10 years: Yes If all of the above answers are "NO", then may proceed with Cephalosporin use. Tolerated ceftriaxone 03/24/20  . Sulfa Antibiotics Anaphylaxis and Hives  . Clindamycin/Lincomycin Hives    Labs:  Results for orders placed or performed during the hospital encounter of 09/02/20 (from the past 48 hour(s))  Glucose, capillary     Status: Abnormal   Collection Time: 09/09/20  4:06 PM  Result Value Ref Range   Glucose-Capillary 289 (H) 70 - 99 mg/dL    Comment: Glucose reference range applies only to samples taken after fasting for at  least 8 hours.  Glucose, capillary     Status: Abnormal   Collection Time: 09/09/20  9:03 PM  Result Value Ref Range   Glucose-Capillary 358 (H) 70 - 99 mg/dL    Comment: Glucose reference range applies only to samples taken after fasting for at least 8 hours.  CBC     Status: Abnormal   Collection Time: 09/10/20  2:29 AM  Result Value Ref Range   WBC 9.7 4.0 - 10.5 K/uL   RBC 3.94 (L) 4.22 - 5.81 MIL/uL   Hemoglobin 11.8 (L) 13.0 - 17.0 g/dL   HCT 86.5 (L) 39 - 52 %   MCV 89.6 80.0 - 100.0 fL   MCH 29.9 26.0 - 34.0 pg   MCHC 33.4 30.0 - 36.0 g/dL   RDW 78.4 69.6 - 29.5 %   Platelets 344 150 - 400 K/uL   nRBC 0.0 0.0 - 0.2 %    Comment: Performed at [facility], [address], [place], [place] [address]  Comprehensive metabolic panel     Status: Abnormal   Collection Time: 09/10/20  2:29 AM  Result Value Ref Range   Sodium 135 135 - 145 mmol/L   Potassium 4.6 3.5 - 5.1 mmol/L   Chloride 101 98 - 111 mmol/L   CO2 23 22 - 32 mmol/L   Glucose, Bld 418 (H) 70 - 99 mg/dL    Comment: Glucose reference range applies only to samples taken after fasting for at least 8 hours.   BUN 14  6 - 20 mg/dL   Creatinine, Ser 2.44 0.61 - 1.24 mg/dL   Calcium 9.2 8.9 - 01.0 mg/dL   Total Protein 6.4 (L) 6.5 - 8.1 g/dL   Albumin 2.9 (L) 3.5 - 5.0 g/dL   AST 17 15 - 41 U/L   ALT 21 0 - 44 U/L   Alkaline Phosphatase 63 38 - 126 U/L   Total Bilirubin 0.5 0.3 - 1.2 mg/dL   GFR, Estimated >27 >25 mL/min    Comment: (NOTE) Calculated using the CKD-EPI Creatinine Equation (2021)    Anion gap 11 5 - 15    Comment: Performed at [facility], [address], [place], [place] [address]  Magnesium     Status: None   Collection Time: 09/10/20  2:29 AM  Result Value Ref Range   Magnesium 1.7 1.7 - 2.4 mg/dL    Comment: Performed at [facility], [address], [place], [place] [address]  Glucose, capillary     Status: Abnormal   Collection Time: 09/10/20  6:27 AM  Result Value Ref Range   Glucose-Capillary 398 (H) 70 - 99 mg/dL    Comment: Glucose reference range applies only to samples taken after fasting for at least 8 hours.  Glucose, capillary     Status: Abnormal   Collection Time: 09/10/20  8:27 AM  Result Value Ref Range   Glucose-Capillary 329 (H) 70 - 99 mg/dL    Comment: Glucose reference range applies only to samples taken after fasting for at least 8 hours.  Glucose, capillary     Status: Abnormal   Collection Time: 09/10/20 12:11 PM  Result Value Ref Range   Glucose-Capillary 270 (H) 70 - 99 mg/dL    Comment: Glucose reference range applies only to samples taken after fasting for at least 8 hours.  Glucose, capillary     Status: Abnormal   Collection Time: 09/10/20  4:17 PM  Result Value Ref Range  Glucose-Capillary 356 (H) 70 - 99 mg/dL    Comment: Glucose reference range applies only to samples taken after fasting for at least 8 hours.  Glucose, capillary     Status: Abnormal   Collection Time: 09/10/20  8:34 PM  Result Value Ref Range   Glucose-Capillary 247 (H) 70 - 99 mg/dL    Comment: Glucose reference range applies only to samples taken after  fasting for at least 8 hours.  Basic metabolic panel     Status: Abnormal   Collection Time: 09/11/20  1:58 AM  Result Value Ref Range   Sodium 136 135 - 145 mmol/L   Potassium 4.2 3.5 - 5.1 mmol/L   Chloride 103 98 - 111 mmol/L   CO2 25 22 - 32 mmol/L   Glucose, Bld 326 (H) 70 - 99 mg/dL    Comment: Glucose reference range applies only to samples taken after fasting for at least 8 hours.   BUN 14 6 - 20 mg/dL   Creatinine, Ser 1.70 0.61 - 1.24 mg/dL   Calcium 9.3 8.9 - 01.7 mg/dL   GFR, Estimated >49 >44 mL/min    Comment: (NOTE) Calculated using the CKD-EPI Creatinine Equation (2021)    Anion gap 8 5 - 15    Comment: Performed at [facility], [address], [place], [place] [address]  CBC     Status: Abnormal   Collection Time: 09/11/20  1:58 AM  Result Value Ref Range   WBC 10.1 4.0 - 10.5 K/uL   RBC 3.93 (L) 4.22 - 5.81 MIL/uL   Hemoglobin 11.8 (L) 13.0 - 17.0 g/dL   HCT 16.3 (L) 39 - 52 %   MCV 89.1 80.0 - 100.0 fL   MCH 30.0 26.0 - 34.0 pg   MCHC 33.7 30.0 - 36.0 g/dL   RDW 84.6 65.9 - 93.5 %   Platelets 383 150 - 400 K/uL   nRBC 0.0 0.0 - 0.2 %    Comment: Performed at [facility], [address] [place], [place] [address]  Magnesium     Status: None   Collection Time: 09/11/20  1:58 AM  Result Value Ref Range   Magnesium 1.7 1.7 - 2.4 mg/dL    Comment: Performed at [facility], [address], [place], [place] [address]  Glucose, capillary     Status: Abnormal   Collection Time: 09/11/20  6:04 AM  Result Value Ref Range   Glucose-Capillary 335 (H) 70 - 99 mg/dL    Comment: Glucose reference range applies only to samples taken after fasting for at least 8 hours.  Glucose, capillary     Status: Abnormal   Collection Time: 09/11/20  8:27 AM  Result Value Ref Range   Glucose-Capillary 433 (H) 70 - 99 mg/dL    Comment: Glucose reference range applies only to samples taken after fasting for at least 8 hours.  Glucose, random     Status:  Abnormal   Collection Time: 09/11/20  9:51 AM  Result Value Ref Range   Glucose, Bld 588 (HH) 70 - 99 mg/dL    Comment: Glucose reference range applies only to samples taken after fasting for at least 8 hours. CRITICAL RESULT CALLED TO, READ BACK BY AND VERIFIED WITH: [name],M RN @ 1038 09/11/20 LEONARD,A Performed at [facility], [address], [place], [place] [address]   Glucose, capillary     Status: Abnormal   Collection Time: 09/11/20 10:23 AM  Result Value Ref  Range   Glucose-Capillary 468 (H) 70 - 99 mg/dL    Comment: Glucose reference range applies only to samples taken after fasting for at least 8 hours.    Current Facility-Administered Medications  Medication Dose Route Frequency Provider Last Rate Last Admin  . acetaminophen (TYLENOL) tablet 650 mg  650 mg Oral Q6H PRN Pokhrel, Laxman, MD      . atorvastatin (LIPITOR) tablet 20 mg  20 mg Oral Daily Pokhrel, Laxman, MD   20 mg at 09/11/20 0913  . Chlorhexidine Gluconate Cloth 2 % PADS 6 each  6 each Topical Daily Lynnell CatalanAgarwala, Ravi, MD   6 each at 09/09/20 0924  . divalproex (DEPAKOTE) DR tablet 500 mg  500 mg Oral Daily Agarwala, Ravi, MD      . divalproex (DEPAKOTE) DR tablet 750 mg  750 mg Oral QPM Agarwala, Ravi, MD      . docusate (COLACE) 50 MG/5ML liquid 100 mg  100 mg Oral Daily PRN Pokhrel, Laxman, MD      . escitalopram (LEXAPRO) tablet 20 mg  20 mg Oral Daily Agarwala, Ravi, MD      . feeding supplement (GLUCERNA SHAKE) (GLUCERNA SHAKE) liquid 237 mL  237 mL Oral TID BM Pokhrel, Laxman, MD   237 mL at 09/10/20 2044  . insulin aspart (novoLOG) injection 0-15 Units  0-15 Units Subcutaneous TID WC Pokhrel, Laxman, MD   15 Units at 09/10/20 1722  . insulin aspart (novoLOG) injection 0-5 Units  0-5 Units Subcutaneous QHS Joycelyn DasPokhrel, Laxman, MD   2 Units at 09/10/20 2045  . insulin aspart (novoLOG) injection 6 Units  6 Units Subcutaneous TID WC Pokhrel, Laxman, MD   6 Units at 09/10/20 1722  . insulin detemir (LEVEMIR)  injection 20 Units  20 Units Subcutaneous BID Lurene ShadowAyiku, Bernard, MD   20 Units at 09/11/20 1024  . levothyroxine (SYNTHROID) tablet 50 mcg  50 mcg Oral Q0600 Joycelyn DasPokhrel, Laxman, MD   50 mcg at 09/11/20 0558  . pantoprazole (PROTONIX) EC tablet 40 mg  40 mg Oral Q1200 Pokhrel, Laxman, MD   40 mg at 09/10/20 1251  . polyethylene glycol (MIRALAX / GLYCOLAX) packet 17 g  17 g Oral Daily PRN Pokhrel, Laxman, MD      . QUEtiapine (SEROQUEL) tablet 300 mg  300 mg Oral QHS Opyd, Lavone Neriimothy S, MD   300 mg at 09/10/20 2043    Musculoskeletal: Strength & Muscle Tone: within normal limits Gait & Station:  seen resting in bed  Patient leans: N/A  Psychiatric Specialty Exam: Physical Exam Vitals reviewed.  Neurological:     Mental Status: He is alert.  Psychiatric:        Attention and Perception: Attention normal.        Mood and Affect: Mood normal.        Behavior: Behavior normal. Behavior is cooperative.        Thought Content: Thought content includes suicidal ideation. Thought content does not include suicidal plan.        Cognition and Memory: Cognition normal.     Review of Systems  Psychiatric/Behavioral: Negative for behavioral problems, decreased concentration and hallucinations. The patient is nervous/anxious.   All other systems reviewed and are negative.   Blood pressure 112/81, pulse 91, temperature 98 F (36.7 C), temperature source Oral, resp. rate 18, height 6\' 2"  (1.88 m), weight 97.4 kg, SpO2 97 %.Body mass index is 27.57 kg/m.  General Appearance: Disheveled and Guarded and flat   Eye Contact:  Good  Speech:  Clear and Coherent  Volume:  Normal  Mood:  Depressed  Affect:  Congruent  Thought Process:  Coherent  Orientation:  Full (Time, Place, and Person)  Thought Content:  Logical  Suicidal Thoughts:  Yes.  without intent/plan  Homicidal Thoughts:  No  Memory:  Immediate;   Fair Recent;   Fair  Judgement:  Fair  Insight:  Fair  Psychomotor Activity:  Normal   Concentration:  Concentration: Fair  Recall:  [employer] of Knowledge:  Fair  Language:  Fair  Akathisia:  No  Handed:  Right  AIMS (if indicated):     Assets:  Communication Skills Desire for Improvement Social Support  ADL's:  Intact  Cognition:  WNL  Sleep:        Treatment Plan Summary: Daily contact with patient to assess and evaluate symptoms and progress in treatment and Medication management  -  CSW to provided outpatient resources- consult placed 11/21 - Continue medication as directed   Disposition: No evidence of imminent risk to self or others at present.   Patient does not meet criteria for psychiatric inpatient admission. Supportive therapy provided about ongoing stressors. Refer to IOP. Discussed crisis plan, support from social network, calling 911, coming to the Emergency Department, and calling Suicide Hotline.  Oneta Rack, NP 09/11/2020 12:51 PM

## 2020-09-11 NOTE — Progress Notes (Addendum)
Progress Note    Mark [name]  DDU:[id number] DOB: 04/27/1986  DOA: 09/02/2020 PCP: Patient, No Pcp Per      Brief Narrative:    Medical records reviewed and are as summarized below:  Mark [name] is a 34 y.o. male with past medical history of diabetes mellitus type 1, anxiety, bipolar disorder, depression, seizure disorder, history of hydrocephalus chronic smoker presented to hospital with altered mental status.  He was noted to be in diabetic ketoacidosis and was intubated on 09/02/2020 for airway protection and concerns for aspiration.  CT scan of the head showed marked hydrocephalus with acute aqueduct stenosis and right frontal ventriculostomy site with drain.  He was started on insulin drip.  Neurology was consulted.  He was extubated on 09/04/2020 and was considered stable for transfer to medical floor.  Patient was also started on vancomycin and Rocephin IV during hospitalization.       Assessment/Plan:   Principal Problem:   AMS (altered mental status) Active Problems:   Diabetic keto-acidosis (HCC)   Obstructive hydrocephalus (HCC)   Bipolar I disorder, most recent episode depressed, severe without psychotic features (HCC)   Nutrition Problem: Inadequate oral intake Etiology: inability to eat  Signs/Symptoms: NPO status   Body mass index is 27.57 kg/m.    Acute hypoxic respiratory failure with compromised airway and aspiration pneumonitis: Improved. Status post intubation and mechanical ventilation.  He is tolerating room air.  He completed a course of IV Rocephin and vancomycin.   No growth on blood cultures.  DKA with poorly controlled DM type 1 with hyperglycemia. DKA has resolved but he still has severe hyperglycemia.  Increase Levemir to 35 units twice daily and adjust dose as needed.  Continue insulin aspart. Patient takes total of 70 units of insulin at home.  Hemoglobin A1c of 8.2 on 06/21/2020.   Metabolic encephalopathy with chronic  hydrocephalus. Hx of Bipolar, depression, anxiety. On Depakote and Lexapro at home.    He was started on Seroquel 09/09/2020. Gabapentin is still on hold.  Patient was seen by neurology.  Patient does have chronic hydrocephalus.  CT head with marked hydrocephalus involving third and lateral ventricles consistent with aqueductal stenosis.  Neurosurgeon does not recommend any surgical intervention.    Psychiatrist was consulted today because of suicidal ideation.  Patient had been placed on one-to-one suicide watch.  According to psychiatrist, patient is not at imminent risk to self or others.  One-to-one sitter has been discontinued because of staffing issues.  Dysphagia following intubation.  Is on dysphagia 3 diet.  Follow-up with speech therapist. Advance diet as per speech therapy.  Continue Protonix.   Hypokalemia, hypomagnesemia, hypophosphatemia. Improved with replacement.    Anemia of chronic disease. H&H is stable.  Homelessness. Patient however states that he lives with his friend Mark [name] currently.  History of hyperlipidemia  Continue Lipitor  Hypothyroidism. Continue Synthroid.  TSH was elevated at 14.4.  Will need to monitor closely as outpatient.  Possibility of noncompliance.    Deconditioning, debility with right-sided weakness. PT, OT on board.  Recommended CIR, CIR on board.  Recommend continuation of physical therapy and reassessment on Monday.    Plan discussed with his nurse, Melissa.    Diet Order            DIET DYS 3 Room service appropriate? Yes with Assist; Fluid consistency: Thin  Diet effective now  Consultants:  Intensivist  Neurologist  Psychiatrist  Procedures:  Intubation and mechanical ventilation on 09/02/2020    Medications:   . atorvastatin  20 mg Oral Daily  . Chlorhexidine Gluconate Cloth  6 each Topical Daily  . divalproex  500 mg Oral Daily  . divalproex  750 mg Oral QPM  . escitalopram  20 mg  Oral Daily  . feeding supplement (GLUCERNA SHAKE)  237 mL Oral TID BM  . insulin aspart  0-15 Units Subcutaneous TID WC  . insulin aspart  0-5 Units Subcutaneous QHS  . insulin aspart  6 Units Subcutaneous TID WC  . insulin detemir  20 Units Subcutaneous BID  . levothyroxine  50 mcg Oral Q0600  . pantoprazole  40 mg Oral Q1200  . QUEtiapine  300 mg Oral QHS   Continuous Infusions:   Anti-infectives (From admission, onward)   Start     Dose/Rate Route Frequency Ordered Stop   09/03/20 0945  cefTRIAXone (ROCEPHIN) 2 g in sodium chloride 0.9 % 100 mL IVPB        2 g 200 mL/hr over 30 Minutes Intravenous Every 24 hours 09/03/20 0854 09/06/20 1126   09/03/20 0800  ceFEPIme (MAXIPIME) 2 g in sodium chloride 0.9 % 100 mL IVPB  Status:  Discontinued        2 g 200 mL/hr over 30 Minutes Intravenous Every 24 hours 09/02/20 0742 09/02/20 1331   09/02/20 2200  vancomycin (VANCOREADY) IVPB 750 mg/150 mL  Status:  Discontinued        750 mg 150 mL/hr over 60 Minutes Intravenous Every 12 hours 09/02/20 1329 09/03/20 0854   09/02/20 1800  ceFEPIme (MAXIPIME) 2 g in sodium chloride 0.9 % 100 mL IVPB  Status:  Discontinued        2 g 200 mL/hr over 30 Minutes Intravenous Every 8 hours 09/02/20 1331 09/03/20 0854   09/02/20 0745  ceFEPIme (MAXIPIME) 2 g in sodium chloride 0.9 % 100 mL IVPB        2 g 200 mL/hr over 30 Minutes Intravenous  Once 09/02/20 0732 09/02/20 0932   09/02/20 0745  vancomycin (VANCOREADY) IVPB 2000 mg/400 mL        2,000 mg 200 mL/hr over 120 Minutes Intravenous  Once 09/02/20 0733 09/02/20 1257   09/02/20 0741  vancomycin variable dose per unstable renal function (pharmacist dosing)  Status:  Discontinued         Does not apply See admin instructions 09/02/20 0742 09/02/20 1329   09/02/20 0730  aztreonam (AZACTAM) 2 g in sodium chloride 0.9 % 100 mL IVPB  Status:  Discontinued        2 g 200 mL/hr over 30 Minutes Intravenous  Once 09/02/20 0728 09/02/20 0732   09/02/20 0730   metroNIDAZOLE (FLAGYL) IVPB 500 mg        500 mg 100 mL/hr over 60 Minutes Intravenous  Once 09/02/20 0728 09/02/20 1040   09/02/20 0730  vancomycin (VANCOCIN) IVPB 1000 mg/200 mL premix  Status:  Discontinued        1,000 mg 200 mL/hr over 60 Minutes Intravenous  Once 09/02/20 0728 09/02/20 0733             Family Communication/Anticipated D/C date and plan/Code Status   DVT prophylaxis: SCDs Start: 09/02/20 0844     Code Status: Full Code  Family Communication: None Disposition Plan:    Status is: Inpatient  Remains inpatient appropriate because:Inpatient level of care appropriate due to severity of illness  Dispo: The patient is from: Home              Anticipated d/c is to: Home              Anticipated d/c date is: 1 day              Patient currently is not medically stable to d/c.           Subjective:   Interval events noted.  Patient says he is feeling depressed and he is having suicidal thoughts.  He said he does not have a plan.  He thinks that he may feel better if he is allowed to leave the hospital.  Objective:    Vitals:   09/11/20 0023 09/11/20 0431 09/11/20 0731 09/11/20 1127  BP: 107/73 124/67 108/77 112/81  Pulse: 75 74 86 91  Resp: 18 20 20 18   Temp: 98.8 F (37.1 C) 98.4 F (36.9 C) 98.5 F (36.9 C) 98 F (36.7 C)  TempSrc: Oral Oral Oral Oral  SpO2: 97% 95% 94% 97%  Weight:      Height:       No data found.   Intake/Output Summary (Last 24 hours) at 09/11/2020 1327 Last data filed at 09/11/2020 0900 Gross per 24 hour  Intake --  Output 1900 ml  Net -1900 ml   Filed Weights   09/07/20 0500 09/08/20 0500 09/09/20 0453  Weight: 103 kg 102.2 kg 97.4 kg    Exam:  GEN: NAD SKIN: No rash EYES: EOMI ENT: MMM CV: RRR PULM: CTA B ABD: soft, ND, NT, +BS CNS: AAO x 3, non focal EXT: No edema or tenderness   Data Reviewed:   I have personally reviewed following labs and imaging studies:  Labs: Labs show  the following:   Basic Metabolic Panel: Recent Labs  Lab 09/05/20 0657 09/06/20 0352 09/06/20 1304 09/06/20 1304 09/07/20 0105 09/07/20 0105 09/08/20 0327 09/08/20 0327 09/10/20 0229 09/11/20 0158 09/11/20 0951  NA 137   < > 141  --  144  --  142  --  135 136  --   K 3.5   < > 3.9   < > 3.7   < > 3.9   < > 4.6 4.2  --   CL 106   < > 102  --  108  --  107  --  101 103  --   CO2 23   < > 27  --  27  --  26  --  23 25  --   GLUCOSE 246*   < > 299*   < > 223*  --  205*  --  418* 326* 588*  BUN 9   < > 8  --  8  --  10  --  14 14  --   CREATININE 0.78   < > 0.85  --  0.79  --  0.82  --  0.92 0.94  --   CALCIUM 8.4*   < > 9.1  --  9.2  --  9.2  --  9.2 9.3  --   MG 1.7  --   --   --  1.7  --   --   --  1.7 1.7  --   PHOS 2.3*  --   --   --  2.7  --   --   --   --   --   --    < > = values in this interval not  displayed.   GFR Estimated Creatinine Clearance: 126.3 mL/min (by C-G formula based on SCr of 0.94 mg/dL). Liver Function Tests: Recent Labs  Lab 09/10/20 0229  AST 17  ALT 21  ALKPHOS 63  BILITOT 0.5  PROT 6.4*  ALBUMIN 2.9*   No results for input(s): LIPASE, AMYLASE in the last 168 hours. No results for input(s): AMMONIA in the last 168 hours. Coagulation profile No results for input(s): INR, PROTIME in the last 168 hours.  CBC: Recent Labs  Lab 09/06/20 0352 09/10/20 0229 09/11/20 0158  WBC 9.5 9.7 10.1  HGB 9.7* 11.8* 11.8*  HCT 29.1* 35.3* 35.0*  MCV 92.7 89.6 89.1  PLT 190 344 383   Cardiac Enzymes: No results for input(s): CKTOTAL, CKMB, CKMBINDEX, TROPONINI in the last 168 hours. BNP (last 3 results) No results for input(s): PROBNP in the last 8760 hours. CBG: Recent Labs  Lab 09/10/20 1617 09/10/20 2034 09/11/20 0604 09/11/20 0827 09/11/20 1023  GLUCAP 356* 247* 335* 433* 468*   D-Dimer: No results for input(s): DDIMER in the last 72 hours. Hgb A1c: No results for input(s): HGBA1C in the last 72 hours. Lipid Profile: No results for  input(s): CHOL, HDL, LDLCALC, TRIG, CHOLHDL, LDLDIRECT in the last 72 hours. Thyroid function studies: No results for input(s): TSH, T4TOTAL, T3FREE, THYROIDAB in the last 72 hours.  Invalid input(s): FREET3 Anemia work up: No results for input(s): VITAMINB12, FOLATE, FERRITIN, TIBC, IRON, RETICCTPCT in the last 72 hours. Sepsis Labs: Recent Labs  Lab 09/06/20 0352 09/10/20 0229 09/11/20 0158  WBC 9.5 9.7 10.1    Microbiology Recent Results (from the past 240 hour(s))  Respiratory Panel by RT PCR (Flu A&B, Covid) - Nasopharyngeal Swab     Status: None   Collection Time: 09/02/20  8:04 AM   Specimen: Nasopharyngeal Swab  Result Value Ref Range Status   SARS Coronavirus 2 by RT PCR NEGATIVE NEGATIVE Final    Comment: (NOTE) SARS-CoV-2 target nucleic acids are NOT DETECTED.  The SARS-CoV-2 RNA is generally detectable in upper respiratoy specimens during the acute phase of infection. The lowest concentration of SARS-CoV-2 viral copies this assay can detect is 131 copies/mL. A negative result does not preclude SARS-Cov-2 infection and should not be used as the sole basis for treatment or other patient management decisions. A negative result may occur with  improper specimen collection/handling, submission of specimen other than nasopharyngeal swab, presence of viral mutation(s) within the areas targeted by this assay, and inadequate number of viral copies (<131 copies/mL). A negative result must be combined with clinical observations, patient history, and epidemiological information. The expected result is Negative.  Fact Sheet for Patients:  https://www.moore.com/https://www.fda.gov/media/142436/download  Fact Sheet for Healthcare Providers:  https://www.young.biz/https://www.fda.gov/media/142435/download  This test is no t yet approved or cleared by the [place] FDA and  has been authorized for detection and/or diagnosis of SARS-CoV-2 by FDA under an Emergency Use Authorization (EUA). This EUA will remain  in  effect (meaning this test can be used) for the duration of the COVID-19 declaration under Section 564(b)(1) of the Act, 21 U.S.C. section 360bbb-3(b)(1), unless the authorization is terminated or revoked sooner.     Influenza A by PCR NEGATIVE NEGATIVE Final   Influenza B by PCR NEGATIVE NEGATIVE Final    Comment: (NOTE) The Xpert Xpress SARS-CoV-2/FLU/RSV assay is intended as an aid in  the diagnosis of influenza from Nasopharyngeal swab specimens and  should not be used as a sole basis for treatment. Nasal washings and  aspirates  are unacceptable for Xpert Xpress SARS-CoV-2/FLU/RSV  testing.  Fact Sheet for Patients: https://www.moore.com/  Fact Sheet for Healthcare Providers: https://www.young.biz/  This test is not yet approved or cleared by the [place] FDA and  has been authorized for detection and/or diagnosis of SARS-CoV-2 by  FDA under an Emergency Use Authorization (EUA). This EUA will remain  in effect (meaning this test can be used) for the duration of the  Covid-19 declaration under Section 564(b)(1) of the Act, 21  U.S.C. section 360bbb-3(b)(1), unless the authorization is  terminated or revoked. Performed at [facility], [address], [place], [place] [address]   Blood culture (routine x 2)     Status: None   Collection Time: 09/02/20  8:48 AM   Specimen: BLOOD  Result Value Ref Range Status   Specimen Description BLOOD SITE NOT SPECIFIED  Final   Special Requests   Final    BOTTLES DRAWN AEROBIC AND ANAEROBIC Blood Culture results may not be optimal due to an excessive volume of blood received in culture bottles   Culture   Final    NO GROWTH 5 DAYS Performed at [facility], [address], [place], [place] [address]    Report Status 09/07/2020 FINAL  Final  Blood culture (routine x 2)     Status: None   Collection Time: 09/02/20  8:59 AM   Specimen: BLOOD  Result Value Ref Range Status    Specimen Description BLOOD SITE NOT SPECIFIED  Final   Special Requests   Final    BOTTLES DRAWN AEROBIC AND ANAEROBIC Blood Culture adequate volume   Culture   Final    NO GROWTH 5 DAYS Performed at [facility], [address], [place], [place] [address]    Report Status 09/07/2020 FINAL  Final  Urine culture     Status: None   Collection Time: 09/02/20 10:49 AM   Specimen: Urine, Random  Result Value Ref Range Status   Specimen Description URINE, RANDOM  Final   Special Requests NONE  Final   Culture   Final    NO GROWTH Performed at [facility], [address], [place], [place] [address]    Report Status 09/03/2020 FINAL  Final  MRSA PCR Screening     Status: None   Collection Time: 09/02/20 12:06 PM   Specimen: Nasal Mucosa; Nasopharyngeal  Result Value Ref Range Status   MRSA by PCR NEGATIVE NEGATIVE Final    Comment:        The GeneXpert MRSA Assay (FDA approved for NASAL specimens only), is one component of a comprehensive MRSA colonization surveillance program. It is not intended to diagnose MRSA infection nor to guide or monitor treatment for MRSA infections. Performed at [facility], [address], [place], [place] [address]   Culture, blood (routine x 2)     Status: None   Collection Time: 09/02/20  1:29 PM   Specimen: BLOOD  Result Value Ref Range Status   Specimen Description BLOOD RIGHT ANTECUBITAL  Final   Special Requests   Final    BOTTLES DRAWN AEROBIC AND ANAEROBIC Blood Culture adequate volume   Culture   Final    NO GROWTH 5 DAYS Performed at [facility], [address], [place], [place] [address]    Report Status 09/07/2020 FINAL  Final  Culture, blood (routine x 2)     Status: None   Collection Time: 09/02/20  1:29 PM  Specimen: BLOOD LEFT HAND  Result Value Ref Range Status   Specimen Description BLOOD LEFT HAND  Final   Special Requests   Final    BOTTLES DRAWN AEROBIC AND ANAEROBIC Blood Culture adequate  volume   Culture   Final    NO GROWTH 5 DAYS Performed at [facility], [address] [place], [place] [address]    Report Status 09/07/2020 FINAL  Final  Culture, respiratory (tracheal aspirate)     Status: None   Collection Time: 09/06/20  2:41 PM   Specimen: Tracheal Aspirate; Respiratory  Result Value Ref Range Status   Specimen Description TRACHEAL ASPIRATE  Final   Special Requests NONE  Final   Gram Stain   Final    RARE WBC PRESENT, PREDOMINANTLY MONONUCLEAR NO ORGANISMS SEEN    Culture   Final    FEW Normal respiratory flora-no Staph aureus or Pseudomonas seen Performed at [facility], [address], [place], [place] [address]    Report Status 09/08/2020 FINAL  Final    Procedures and diagnostic studies:  No results found.             LOS: 9 days   Quency Tober  [employer] loss adjuster on [email or web address]. If 7PM-7AM, please contact night-coverage at [email or web address]     09/11/2020, 1:27 PM

## 2020-09-11 NOTE — Progress Notes (Signed)
  Speech Language Pathology Treatment: Dysphagia  Patient Details Name: Mark [name] MRN: [id number] DOB: [birth year] Today's Date: 09/11/2020 Time: 1325-1340 SLP Time Calculation (min) (ACUTE ONLY): 15 min  Assessment / Plan / Recommendation Clinical Impression  Patient seen to address dysphagia goals with trials of regular solids and skilled observation of tolerance of thin liquids. Patient did not have significant difficulty with mastication of graham crackers despite having very few teeth of poor quality. He did exhibit some impulsivity with sips of drink from cup and exhibited both delayed and immediate cough after approximately 3/8 sips. Cough sounded dry, non productive and voice remained clear. Current vocal quality is mildly hoarse but fully intelligible and with adequate volume/intensity. Patient appears to be tolerating regular solids textures well, however he does continue with cough response and is not demonstrating ability to follow safety precautions unless fully supervised and cued. Recommend to continue with Dys 3 solids but continue with regular texture solids trials.    HPI HPI: Pt is a 34 yo male admitted with AMS, sonorous respirations, and concern for aspiration in the setting of DKA. Pt was intubated 11/12; self-extubated 11/16. PMH includes: DM type 1, anxiety, bipolar, depression, DM neuropathy, nephrolithiasis, seizures, hydrocephalus      SLP Plan  Continue with current plan of care       Recommendations  Diet recommendations: Dysphagia 3 (mechanical soft);Thin liquid Liquids provided via: Cup;No straw Medication Administration: Crushed with puree Supervision: Staff to assist with self feeding;Full supervision/cueing for compensatory strategies Compensations: Minimize environmental distractions;Slow rate;Small sips/bites Postural Changes and/or Swallow Maneuvers: Seated upright 90 degrees                Oral Care Recommendations: Oral care BID Follow up  Recommendations: Inpatient Rehab SLP Visit Diagnosis: Dysphagia, unspecified (R13.10) Plan: Continue with current plan of care       GO                Angela Nevin, MA, CCC-SLP Speech Therapy [facility] Acute [facility]

## 2020-09-12 LAB — GLUCOSE, CAPILLARY
Glucose-Capillary: 87 mg/dL (ref 70–99)
Glucose-Capillary: 217 mg/dL — ABNORMAL HIGH (ref 70–99)
Glucose-Capillary: 93 mg/dL (ref 70–99)
Glucose-Capillary: 234 mg/dL — ABNORMAL HIGH (ref 70–99)
Glucose-Capillary: 173 mg/dL — ABNORMAL HIGH (ref 70–99)

## 2020-09-12 MED ORDER — INSULIN ASPART PROT & ASPART (70-30 MIX) 100 UNIT/ML ~~LOC~~ SUSP
35.0000 [IU] | Freq: Two times a day (BID) | SUBCUTANEOUS | Status: DC
  Administered 2020-09-12 – 2020-09-14 (×5): 35 [IU] via SUBCUTANEOUS
  Filled 2020-09-12: qty 10

## 2020-09-12 MED ORDER — HALOPERIDOL LACTATE 5 MG/ML IJ SOLN
5.0000 mg | Freq: Four times a day (QID) | INTRAMUSCULAR | Status: DC | PRN
  Administered 2020-09-12: 5 mg via INTRAMUSCULAR
  Filled 2020-09-12 (×2): qty 1

## 2020-09-12 NOTE — Progress Notes (Signed)
Inpatient Rehab Admissions Coordinator:   Met with patient at bedside.  He is irritated today, states that he's tired of being in the hospital and that he's been here over a month.  Reoriented him to 10 day length of stay, but patient continues to state he wants to leave.  At this time he is not agreeable to any post-acute rehab.  Will follow from a distance to see how he does with therapy and try to meet with him one more time to see if he would be agreeable to CIR stay.   Shann Medal, PT, DPT Admissions Coordinator [phone] 09/12/20  1:00 PM

## 2020-09-12 NOTE — Progress Notes (Addendum)
Progress Note    Mark [name]  BPZ:[id number] DOB: 11/03/1985  DOA: 09/02/2020 PCP: Patient, No Pcp Per      Brief Narrative:    Medical records reviewed and are as summarized below:  Mark [name] is a 34 y.o. male with past medical history of diabetes mellitus type 1, anxiety, bipolar disorder, depression, seizure disorder, history of hydrocephalus chronic smoker presented to hospital with altered mental status.  He was noted to be in diabetic ketoacidosis and was intubated on 09/02/2020 for airway protection and concerns for aspiration.  CT scan of the head showed marked hydrocephalus with acute aqueduct stenosis and right frontal ventriculostomy site with drain.  He was started on insulin drip.  Neurology was consulted.  He was extubated on 09/04/2020 and was considered stable for transfer to medical floor.  Patient was also started on vancomycin and Rocephin IV during hospitalization.       Assessment/Plan:   Principal Problem:   AMS (altered mental status) Active Problems:   Diabetic keto-acidosis (HCC)   Obstructive hydrocephalus (HCC)   Bipolar I disorder, most recent episode depressed, severe without psychotic features (HCC)   Nutrition Problem: Inadequate oral intake Etiology: inability to eat  Signs/Symptoms: NPO status   Body mass index is 27.57 kg/m.    Acute hypoxic respiratory failure with compromised airway and aspiration pneumonitis: Improved. Status post intubation and mechanical ventilation.  He is tolerating room air.  He completed a course of IV Rocephin and vancomycin.   No growth on blood cultures.  DKA with poorly controlled DM type 1 with hyperglycemia. DKA has resolved.  Glucose levels are better today.  Substitute NovoLog Mix 70/30 35 units twice daily to mimic home dose.  NovoLog as needed for severe hyperglycemia. Hemoglobin A1c of 8.2 on 9/01//2021.   Metabolic encephalopathy with chronic hydrocephalus. Hx of Bipolar,  depression, anxiety, intermittent agitation. On Depakote and Lexapro at home.    He was started on Seroquel 09/09/2020. Gabapentin is still on hold.  Patient was seen by neurology.  Patient does have chronic hydrocephalus.  CT head with marked hydrocephalus involving third and lateral ventricles consistent with aqueductal stenosis.  Neurosurgeon does not recommend any surgical intervention.    He was evaluated by the psychiatrist on 09/11/2020 because he he said he was suicidal.  Psychiatrist determined that patient was not an imminent risk to self or others so suicide precaution/one-to-one watch was discontinued.  IM Haldol as needed for agitation.  Dysphagia following intubation.  Continue dysphagia 3 diet.  Follow-up with speech therapist. Advance diet as per speech therapy.  Continue Protonix.   Hypokalemia, hypomagnesemia, hypophosphatemia. Improved with replacement.    Anemia of chronic disease. H&H is stable.  Homelessness. Patient however states that he lives with his friend Mark Ruiz currently.  History of hyperlipidemia  Continue Lipitor  Hypothyroidism. Continue Synthroid.  TSH was elevated at 14.4.  Will need to monitor closely as outpatient.  Possibility of noncompliance.    Deconditioning, debility with right-sided weakness. PT, OT on board.  Recommended CIR. Assessment and evaluation for discharge to inpatient rehab is still pending.   Patient has been advised not to leave AGAINST MEDICAL ADVICE since he has no safe discharge plan in place at this time.   Diet Order            DIET DYS 3 Room service appropriate? Yes with Assist; Fluid consistency: Thin  Diet effective now  Consultants:  Intensivist  Neurologist  Psychiatrist  Procedures:  Intubation and mechanical ventilation on 09/02/2020    Medications:   . atorvastatin  20 mg Oral Daily  . Chlorhexidine Gluconate Cloth  6 each Topical Daily  . divalproex  500 mg Oral  Daily  . divalproex  750 mg Oral QPM  . escitalopram  20 mg Oral Daily  . feeding supplement (GLUCERNA SHAKE)  237 mL Oral TID BM  . insulin aspart  0-15 Units Subcutaneous TID WC  . insulin aspart  0-5 Units Subcutaneous QHS  . insulin aspart protamine- aspart  35 Units Subcutaneous BID WC  . levothyroxine  50 mcg Oral Q0600  . pantoprazole  40 mg Oral Q1200  . QUEtiapine  300 mg Oral QHS   Continuous Infusions:   Anti-infectives (From admission, onward)   Start     Dose/Rate Route Frequency Ordered Stop   09/03/20 0945  cefTRIAXone (ROCEPHIN) 2 g in sodium chloride 0.9 % 100 mL IVPB        2 g 200 mL/hr over 30 Minutes Intravenous Every 24 hours 09/03/20 0854 09/06/20 1126   09/03/20 0800  ceFEPIme (MAXIPIME) 2 g in sodium chloride 0.9 % 100 mL IVPB  Status:  Discontinued        2 g 200 mL/hr over 30 Minutes Intravenous Every 24 hours 09/02/20 0742 09/02/20 1331   09/02/20 2200  vancomycin (VANCOREADY) IVPB 750 mg/150 mL  Status:  Discontinued        750 mg 150 mL/hr over 60 Minutes Intravenous Every 12 hours 09/02/20 1329 09/03/20 0854   09/02/20 1800  ceFEPIme (MAXIPIME) 2 g in sodium chloride 0.9 % 100 mL IVPB  Status:  Discontinued        2 g 200 mL/hr over 30 Minutes Intravenous Every 8 hours 09/02/20 1331 09/03/20 0854   09/02/20 0745  ceFEPIme (MAXIPIME) 2 g in sodium chloride 0.9 % 100 mL IVPB        2 g 200 mL/hr over 30 Minutes Intravenous  Once 09/02/20 0732 09/02/20 0932   09/02/20 0745  vancomycin (VANCOREADY) IVPB 2000 mg/400 mL        2,000 mg 200 mL/hr over 120 Minutes Intravenous  Once 09/02/20 0733 09/02/20 1257   09/02/20 0741  vancomycin variable dose per unstable renal function (pharmacist dosing)  Status:  Discontinued         Does not apply See admin instructions 09/02/20 0742 09/02/20 1329   09/02/20 0730  aztreonam (AZACTAM) 2 g in sodium chloride 0.9 % 100 mL IVPB  Status:  Discontinued        2 g 200 mL/hr over 30 Minutes Intravenous  Once 09/02/20  0728 09/02/20 0732   09/02/20 0730  metroNIDAZOLE (FLAGYL) IVPB 500 mg        500 mg 100 mL/hr over 60 Minutes Intravenous  Once 09/02/20 0728 09/02/20 1040   09/02/20 0730  vancomycin (VANCOCIN) IVPB 1000 mg/200 mL premix  Status:  Discontinued        1,000 mg 200 mL/hr over 60 Minutes Intravenous  Once 09/02/20 0728 09/02/20 0733             Family Communication/Anticipated D/C date and plan/Code Status   DVT prophylaxis: SCDs Start: 09/02/20 0844     Code Status: Full Code  Family Communication: None Disposition Plan:    Status is: Inpatient  Remains inpatient appropriate because:Unsafe d/c plan   Dispo: The patient is from: Home  Anticipated d/c is to: Home              Anticipated d/c date is: 1 day              Patient currently is medically stable to d/c.           Subjective:   Interval events noted.  He had threatened to leave AGAINST MEDICAL ADVICE on 09/11/2020 but he changed his mind.  Today, he he brought up the issue of leaving AGAINST MEDICAL ADVICE again.  According to his nurse, he has been agitated and verbally abusive at times.   Objective:    Vitals:   09/11/20 2044 09/12/20 0053 09/12/20 0439 09/12/20 0850  BP: 128/78 120/77 119/72 115/71  Pulse: 83 85 71 80  Resp: 18 18 18 18   Temp: 98.9 F (37.2 C) 97.8 F (36.6 C) 97.8 F (36.6 C) 98.2 F (36.8 C)  TempSrc: Oral Oral Oral Oral  SpO2: 96% 96% 97% 96%  Weight:      Height:       No data found.   Intake/Output Summary (Last 24 hours) at 09/12/2020 1208 Last data filed at 09/12/2020 0437 Gross per 24 hour  Intake 360 ml  Output 550 ml  Net -190 ml   Filed Weights   09/07/20 0500 09/08/20 0500 09/09/20 0453  Weight: 103 kg 102.2 kg 97.4 kg    Exam:  GEN: NAD SKIN: Warm and dry EYES: EOMI ENT: MMM CV: RRR PULM: CTA B ABD: soft, ND, NT, +BS CNS: AAO x 3, non focal EXT: No edema or tenderness    Data Reviewed:   I have personally reviewed  following labs and imaging studies:  Labs: Labs show the following:   Basic Metabolic Panel: Recent Labs  Lab 09/06/20 1304 09/06/20 1304 09/07/20 0105 09/07/20 0105 09/08/20 0327 09/08/20 0327 09/10/20 0229 09/11/20 0158 09/11/20 0951  NA 141  --  144  --  142  --  135 136  --   K 3.9   < > 3.7   < > 3.9   < > 4.6 4.2  --   CL 102  --  108  --  107  --  101 103  --   CO2 27  --  27  --  26  --  23 25  --   GLUCOSE 299*   < > 223*  --  205*  --  418* 326* 588*  BUN 8  --  8  --  10  --  14 14  --   CREATININE 0.85  --  0.79  --  0.82  --  0.92 0.94  --   CALCIUM 9.1  --  9.2  --  9.2  --  9.2 9.3  --   MG  --   --  1.7  --   --   --  1.7 1.7  --   PHOS  --   --  2.7  --   --   --   --   --   --    < > = values in this interval not displayed.   GFR Estimated Creatinine Clearance: 126.3 mL/min (by C-G formula based on SCr of 0.94 mg/dL). Liver Function Tests: Recent Labs  Lab 09/10/20 0229  AST 17  ALT 21  ALKPHOS 63  BILITOT 0.5  PROT 6.4*  ALBUMIN 2.9*   No results for input(s): LIPASE, AMYLASE in the last 168 hours. No results for input(s):  AMMONIA in the last 168 hours. Coagulation profile No results for input(s): INR, PROTIME in the last 168 hours.  CBC: Recent Labs  Lab 09/06/20 0352 09/10/20 0229 09/11/20 0158  WBC 9.5 9.7 10.1  HGB 9.7* 11.8* 11.8*  HCT 29.1* 35.3* 35.0*  MCV 92.7 89.6 89.1  PLT 190 344 383   Cardiac Enzymes: No results for input(s): CKTOTAL, CKMB, CKMBINDEX, TROPONINI in the last 168 hours. BNP (last 3 results) No results for input(s): PROBNP in the last 8760 hours. CBG: Recent Labs  Lab 09/11/20 1541 09/11/20 1835 09/11/20 2116 09/12/20 0608 09/12/20 0913  GLUCAP 175* 128* 107* 87 217*   D-Dimer: No results for input(s): DDIMER in the last 72 hours. Hgb A1c: No results for input(s): HGBA1C in the last 72 hours. Lipid Profile: No results for input(s): CHOL, HDL, LDLCALC, TRIG, CHOLHDL, LDLDIRECT in the last 72  hours. Thyroid function studies: No results for input(s): TSH, T4TOTAL, T3FREE, THYROIDAB in the last 72 hours.  Invalid input(s): FREET3 Anemia work up: No results for input(s): VITAMINB12, FOLATE, FERRITIN, TIBC, IRON, RETICCTPCT in the last 72 hours. Sepsis Labs: Recent Labs  Lab 09/06/20 0352 09/10/20 0229 09/11/20 0158  WBC 9.5 9.7 10.1    Microbiology Recent Results (from the past 240 hour(s))  Culture, blood (routine x 2)     Status: None   Collection Time: 09/02/20  1:29 PM   Specimen: BLOOD  Result Value Ref Range Status   Specimen Description BLOOD RIGHT ANTECUBITAL  Final   Special Requests   Final    BOTTLES DRAWN AEROBIC AND ANAEROBIC Blood Culture adequate volume   Culture   Final    NO GROWTH 5 DAYS Performed at [facility], [address], [place], [place] [address]    Report Status 09/07/2020 FINAL  Final  Culture, blood (routine x 2)     Status: None   Collection Time: 09/02/20  1:29 PM   Specimen: BLOOD LEFT HAND  Result Value Ref Range Status   Specimen Description BLOOD LEFT HAND  Final   Special Requests   Final    BOTTLES DRAWN AEROBIC AND ANAEROBIC Blood Culture adequate volume   Culture   Final    NO GROWTH 5 DAYS Performed at [facility], [address], [place], [place] [address]    Report Status 09/07/2020 FINAL  Final  Culture, respiratory (tracheal aspirate)     Status: None   Collection Time: 09/06/20  2:41 PM   Specimen: Tracheal Aspirate; Respiratory  Result Value Ref Range Status   Specimen Description TRACHEAL ASPIRATE  Final   Special Requests NONE  Final   Gram Stain   Final    RARE WBC PRESENT, PREDOMINANTLY MONONUCLEAR NO ORGANISMS SEEN    Culture   Final    FEW Normal respiratory flora-no Staph aureus or Pseudomonas seen Performed at [facility], [address] [place], [place] [address]    Report Status 09/08/2020 FINAL  Final    Procedures and diagnostic studies:  No results  found.             LOS: 10 days   Nattie Lazenby  [facility]   Pager on [email or web address]. If 7PM-7AM, please contact night-coverage at [email or web address]     09/12/2020, 12:08 PM

## 2020-09-12 NOTE — Plan of Care (Signed)
  Problem: Health Behavior/Discharge Planning: Goal: Ability to manage health-related needs will improve Outcome: Progressing   Problem: Clinical Measurements: Goal: Ability to maintain clinical measurements within normal limits will improve Outcome: Progressing Goal: Will remain free from infection Outcome: Progressing Goal: Diagnostic test results will improve Outcome: Progressing Goal: Respiratory complications will improve Outcome: Progressing Goal: Cardiovascular complication will be avoided Outcome: Progressing   Problem: Nutrition: Goal: Adequate nutrition will be maintained Outcome: Progressing   Problem: Elimination: Goal: Will not experience complications related to bowel motility Outcome: Progressing Goal: Will not experience complications related to urinary retention Outcome: Progressing   Problem: Respiratory: Goal: Ability to maintain a clear airway and adequate ventilation will improve Outcome: Progressing   Problem: Activity: Goal: Ability to tolerate increased activity will improve Outcome: Progressing   Problem: Respiratory: Goal: Ability to maintain a clear airway and adequate ventilation will improve Outcome: Progressing   Problem: Role Relationship: Goal: Method of communication will improve Outcome: Progressing   Problem: Education: Goal: Knowledge of General Education information will improve Description: Including pain rating scale, medication(s)/side effects and non-pharmacologic comfort measures Outcome: Progressing   Problem: Health Behavior/Discharge Planning: Goal: Ability to manage health-related needs will improve Outcome: Progressing   Problem: Clinical Measurements: Goal: Ability to maintain clinical measurements within normal limits will improve Outcome: Progressing Goal: Will remain free from infection Outcome: Progressing Goal: Diagnostic test results will improve Outcome: Progressing Goal: Respiratory complications will  improve Outcome: Progressing Goal: Cardiovascular complication will be avoided Outcome: Progressing   Problem: Activity: Goal: Risk for activity intolerance will decrease Outcome: Progressing   Problem: Nutrition: Goal: Adequate nutrition will be maintained Outcome: Progressing   Problem: Coping: Goal: Level of anxiety will decrease Outcome: Progressing   Problem: Elimination: Goal: Will not experience complications related to bowel motility Outcome: Progressing Goal: Will not experience complications related to urinary retention Outcome: Progressing   Problem: Pain Managment: Goal: General experience of comfort will improve Outcome: Progressing   Problem: Safety: Goal: Ability to remain free from injury will improve Outcome: Progressing   Problem: Skin Integrity: Goal: Risk for impaired skin integrity will decrease Outcome: Progressing   

## 2020-09-12 NOTE — Progress Notes (Signed)
I M haldol 5 mg given to patient at 1305 for severe agitation. Patient has been using abusive words towards staff and insists on leaving the facility. Charge nurse is aware, MD also notified of patient behavioral situation . Will continue to monitor.

## 2020-09-12 NOTE — Progress Notes (Signed)
SLP Cancellation Note  Patient Details Name: Mark [name] MRN: [id number] DOB: January 27, 1986   Cancelled treatment:       Reason Eval/Treat Not Completed: Attempted to see pt for ongoing dysphagia and cognitive tx but pt declined, saying that he just had lunch and that he needed to have a BM first. Will f/u as able.   [name]., M.A. CCC-SLP Acute Rehabilitation Services Pager [phone] Office [phone]  09/12/2020, 1:48 PM

## 2020-09-12 NOTE — Progress Notes (Signed)
Physical Therapy Treatment Patient Details Name: Mark [name] MRN: [id number] DOB: 14-Feb-1986 Today's Date: 09/12/2020    History of Present Illness 34 year old male with history of seizures on Depakote, type 1 diabetes with frequent episodes of DKA, hydrocephalus who presents to the emergency department on 11/12 with altered mental status with CBG in 500s and sonorous breath sounds.    PT Comments    Patient progressing well towards PT goals. Reports feeling frustrated with whole situation and still being in the hospital. Noted to have significant RLE weakness compared to LLE. Worked on standing bouts with RW with mod A-Max A of 2 with posterior lean, poor trunk control and right knee instability. Able to take a few steps along side bed with Max A of 2. Pt continues to demonstrate poor trunk control and sitting balance needing UE support and Min-mod A to prevent LOB. Worked on there ex especially of RLE. Encouraged performing exercises daily to improve strength. After discussion with pt, pt agreeable to CIR. Will follow.   Follow Up Recommendations  CIR;Supervision/Assistance - 24 hour     Equipment Recommendations  Rolling walker with 5" wheels;3in1 (PT);Wheelchair (measurements PT);Wheelchair cushion (measurements PT)    Recommendations for Other Services       Precautions / Restrictions Precautions Precautions: Fall Restrictions Weight Bearing Restrictions: No    Mobility  Bed Mobility Overal bed mobility: Needs Assistance Bed Mobility: Rolling;Sidelying to Sit Rolling: Min guard Sidelying to sit: Min assist;Mod assist;HOB elevated       General bed mobility comments: Able to bring LEs to EOB, assist with trunk once upright due to poor trunk control and balance.  Transfers Overall transfer level: Needs assistance Equipment used: Rolling walker (2 wheeled) Transfers: Sit to/from Stand Sit to Stand: Mod assist;+2 physical assistance         General transfer  comment: Assist of 2 to power to standing with pt pulling up on RW, posterior lean with pt picking up RW off ground . Right knee instability. Stood from EOB x3. Difficulty getting upright.  Ambulation/Gait Ambulation/Gait assistance: Max assist;Mod assist Gait Distance (Feet): 3 Feet Assistive device: Rolling walker (2 wheeled) Gait Pattern/deviations: Leaning posteriorly;Trunk flexed;Shuffle Gait velocity: decreased   General Gait Details: Able to take a few side steps along side bed with assist for trunk control, RW management and balance. Right knee instability.   Stairs             Wheelchair Mobility    Modified Rankin (Stroke Patients Only)       Balance Overall balance assessment: Needs assistance Sitting-balance support: Feet supported;Single extremity supported Sitting balance-Leahy Scale: Poor Sitting balance - Comments: Requires anywhere from Min-Mod A for static sitting balance, poor trunk control and impaired balance reactions. Difficulty holding head up, flexed posture. LOB in all directions esp posteriorly. Postural control: Posterior lean Standing balance support: During functional activity Standing balance-Leahy Scale: Poor Standing balance comment: Requires UE support and external support (Mod-Max A) for balance with LEs leaning on bed rails and posterior lean. Difficulty with upright posture. Right knee instability.                            Cognition Arousal/Alertness: Awake/alert Behavior During Therapy: Flat affect Overall Cognitive Status: No family/caregiver present to determine baseline cognitive functioning  General Comments: Reports feeling frustrated with everything, wants to go home. Agreeable to CIR after discussion. Follows commands well.      Exercises General Exercises - Lower Extremity Ankle Circles/Pumps: AROM;Both;10 reps;Supine Quad Sets: AROM;Both;10 reps;Supine Heel  Slides: AAROM;Right;10 reps;Supine Hip ABduction/ADduction: AAROM;Right;10 reps;Supine Straight Leg Raises: AAROM;Right;5 reps;AROM;Left;10 reps    General Comments        Pertinent Vitals/Pain Pain Assessment: No/denies pain    Home Living                      Prior Function            PT Goals (current goals can now be found in the care plan section) Progress towards PT goals: Progressing toward goals    Frequency    Min 4X/week      PT Plan Current plan remains appropriate    Co-evaluation              AM-PAC PT "6 Clicks" Mobility   Outcome Measure  Help needed turning from your back to your side while in a flat bed without using bedrails?: A Little Help needed moving from lying on your back to sitting on the side of a flat bed without using bedrails?: A Lot Help needed moving to and from a bed to a chair (including a wheelchair)?: A Lot Help needed standing up from a chair using your arms (e.g., wheelchair or bedside chair)?: A Lot Help needed to walk in hospital room?: Total Help needed climbing 3-5 steps with a railing? : Total 6 Click Score: 11    End of Session Equipment Utilized During Treatment: Gait belt Activity Tolerance: Patient limited by fatigue;Patient tolerated treatment well Patient left: in bed;with call bell/phone within reach;with bed alarm set Nurse Communication: Mobility status PT Visit Diagnosis: Other abnormalities of gait and mobility (R26.89);Muscle weakness (generalized) (M62.81);Unsteadiness on feet (R26.81);Difficulty in walking, not elsewhere classified (R26.2)     Time: 9509-3267 PT Time Calculation (min) (ACUTE ONLY): 21 min  Charges:  $Therapeutic Activity: 8-22 mins                     Vale Haven, PT, DPT Acute Rehabilitation Services Pager [phone] Office [phone]       Mark [name] 09/12/2020, 3:23 PM

## 2020-09-12 NOTE — Progress Notes (Signed)
PT Cancellation Note  Patient Details Name: Mark [name] MRN: [id number] DOB: [birth year]   Cancelled Treatment:    Reason Eval/Treat Not Completed: Patient declined, no reason specified Pt declined PT this AM asking if we could come back later; reports feeling tired and not up to it. Will follow up as time allows.   Blake Divine A Nakoa Ganus 09/12/2020, 10:56 AM Vale Haven, PT, DPT Acute Rehabilitation Services Pager [phone] Office [phone]

## 2020-09-12 NOTE — Plan of Care (Signed)
  Problem: Nutrition: Goal: Adequate nutrition will be maintained Outcome: Progressing   Problem: Elimination: Goal: Will not experience complications related to bowel motility Outcome: Progressing Goal: Will not experience complications related to urinary retention Outcome: Progressing   Problem: Respiratory: Goal: Ability to maintain a clear airway and adequate ventilation will improve Outcome: Progressing

## 2020-09-13 LAB — GLUCOSE, CAPILLARY
Glucose-Capillary: 239 mg/dL — ABNORMAL HIGH (ref 70–99)
Glucose-Capillary: 272 mg/dL — ABNORMAL HIGH (ref 70–99)
Glucose-Capillary: 212 mg/dL — ABNORMAL HIGH (ref 70–99)
Glucose-Capillary: 54 mg/dL — ABNORMAL LOW (ref 70–99)
Glucose-Capillary: 94 mg/dL (ref 70–99)
Glucose-Capillary: 77 mg/dL (ref 70–99)

## 2020-09-13 NOTE — Progress Notes (Signed)
Physical Therapy Treatment Patient Details Name: Mark [name] MRN: [id number] DOB: [birth year] Today's Date: 09/13/2020    History of Present Illness 34 year old male with history of seizures on Depakote, type 1 diabetes with frequent episodes of DKA, hydrocephalus who presents to the emergency department on 11/12 with altered mental status with CBG in 500s and sonorous breath sounds.    PT Comments    Patient progressing well towards PT goals. Session performed in conjunction with OT for 2 pairs of skilled hands. Worked on standing bouts, sitting/standing balance using mirror for feedback when in stedy. Pt able to self correct with visual/verbal cues but not sustain due to fatigue/weakness. Continues to have difficulty with static sitting balance EOB requiring UE support to prevent LOB. Worked on there ex of LEs, stretching of pecs and activation of back muscular to help with upright posture. Pt appears in good mood today and appropriately interacting and engaging. Continue to recommend CIR. Will follow.    Follow Up Recommendations  CIR;Supervision/Assistance - 24 hour     Equipment Recommendations  Rolling walker with 5" wheels;3in1 (PT);Wheelchair (measurements PT);Wheelchair cushion (measurements PT)    Recommendations for Other Services       Precautions / Restrictions Precautions Precautions: Fall Restrictions Weight Bearing Restrictions: No    Mobility  Bed Mobility Overal bed mobility: Needs Assistance Bed Mobility: Rolling;Sidelying to Sit Rolling: Min guard Sidelying to sit: Min assist;Mod assist;HOB elevated       General bed mobility comments: pt able to roll to pts L side with min guard; pt reaching out for therapist to assist with elevating trunk needing MIN A however pt required intital MOD A for sitting balance once EOB  Transfers Overall transfer level: Needs assistance Equipment used: Ambulation equipment used Transfers: Sit to/from Stand Sit to  Stand: Min guard;Min assist;+2 safety/equipment;From elevated surface         General transfer comment: Tolerated standing bouts from EOB and from stedy flaps x5, cues for hand placement. Heavy use of UEs to pull up from stedy. Favors right lateral lean. transferred to chair post ambulation.  Ambulation/Gait             General Gait Details: Deferred today   Stairs             Wheelchair Mobility    Modified Rankin (Stroke Patients Only)       Balance Overall balance assessment: Needs assistance Sitting-balance support: Feet supported;Single extremity supported Sitting balance-Leahy Scale: Poor Sitting balance - Comments: . Worked on finding midline and upright posture sitting in stedy in front of mirror. fatigues quickly with right lateral lean but able to self correct with cues. Worked on stretching pecs, scapular retraction Postural control: Right lateral lean Standing balance support: During functional activity Standing balance-Leahy Scale: Poor Standing balance comment: Requires at least 1 UE support for short periods. Worked on standing bouts in stedy with cues for upright/midline. Worked on releasing 1 UE at a time for standing balance, flexed trunk but able to self correct with cues.                            Cognition Arousal/Alertness: Awake/alert Behavior During Therapy: Flat affect Overall Cognitive Status: No family/caregiver present to determine baseline cognitive functioning                                 General Comments: more  attentive and pleasant in comparison to previous sessions. More engagment and verbalizations today.      Exercises General Exercises - Lower Extremity Long Arc Quad: AROM;Both;10 reps;Seated Hip ABduction/ADduction: AROM;Both;10 reps;Seated Hip Flexion/Marching: AROM;Both;10 reps;Seated Other Exercises Other Exercises: Scapular retraction x10 sitting in stedy. Other Exercises: Manual pec  stretch x30 secs    General Comments        Pertinent Vitals/Pain Pain Assessment: No/denies pain    Home Living                      Prior Function            PT Goals (current goals can now be found in the care plan section) Acute Rehab PT Goals Patient Stated Goal: to improve Progress towards PT goals: Progressing toward goals    Frequency    Min 4X/week      PT Plan Current plan remains appropriate    Co-evaluation PT/OT/SLP Co-Evaluation/Treatment: Yes Reason for Co-Treatment: Complexity of the patient's impairments (multi-system involvement);For patient/therapist safety;To address functional/ADL transfers PT goals addressed during session: Mobility/safety with mobility;Balance;Strengthening/ROM OT goals addressed during session: ADL's and self-care      AM-PAC PT "6 Clicks" Mobility   Outcome Measure  Help needed turning from your back to your side while in a flat bed without using bedrails?: A Little Help needed moving from lying on your back to sitting on the side of a flat bed without using bedrails?: A Lot Help needed moving to and from a bed to a chair (including a wheelchair)?: A Little Help needed standing up from a chair using your arms (e.g., wheelchair or bedside chair)?: A Little Help needed to walk in hospital room?: A Lot Help needed climbing 3-5 steps with a railing? : Total 6 Click Score: 14    End of Session Equipment Utilized During Treatment: Gait belt Activity Tolerance: Patient tolerated treatment well Patient left: in chair;with call bell/phone within reach;with chair alarm set;Other (comment) (SLP in room) Nurse Communication: Mobility status;Need for lift equipment (stedy) PT Visit Diagnosis: Other abnormalities of gait and mobility (R26.89);Muscle weakness (generalized) (M62.81);Unsteadiness on feet (R26.81);Difficulty in walking, not elsewhere classified (R26.2)     Time: 1610-9604 PT Time Calculation (min) (ACUTE  ONLY): 23 min  Charges:  $Neuromuscular Re-education: 8-22 mins                     Vale Haven, PT, DPT Acute Rehabilitation Services Pager [phone] Office [phone]       Blake Divine A Lanier Ensign 09/13/2020, 1:21 PM

## 2020-09-13 NOTE — Progress Notes (Signed)
Patient's CBG at 1542 was 54. MD paged, and patient given apple juice to drink. CBG recheck at 1612 was 94. Continuing scheduled Novolog 70/30 and sliding scale per MD.

## 2020-09-13 NOTE — Progress Notes (Addendum)
Progress Note    Mark [name]  WUJ:[id number] DOB: 12/23/1985  DOA: 09/02/2020 PCP: Patient, No Pcp Per      Brief Narrative:    Medical records reviewed and are as summarized below:  Mark [name] is a 34 y.o. male with past medical history of diabetes mellitus type 1, anxiety, bipolar disorder, depression, seizure disorder, history of hydrocephalus chronic smoker presented to hospital with altered mental status.  He was noted to be in diabetic ketoacidosis and was intubated on 09/02/2020 for airway protection and concerns for aspiration.  CT scan of the head showed marked hydrocephalus with acute aqueduct stenosis and right frontal ventriculostomy site with drain.  He was started on insulin drip.  Neurology was consulted.  He was extubated on 09/04/2020 and was considered stable for transfer to medical floor.  Patient was also started on vancomycin and Rocephin IV during hospitalization.       Assessment/Plan:   Principal Problem:   AMS (altered mental status) Active Problems:   Diabetic keto-acidosis (HCC)   Obstructive hydrocephalus (HCC)   Bipolar I disorder, most recent episode depressed, severe without psychotic features (HCC)   Nutrition Problem: Inadequate oral intake Etiology: inability to eat  Signs/Symptoms: NPO status   Body mass index is 27.57 kg/m.    Acute hypoxic respiratory failure with compromised airway and aspiration pneumonitis: Improved. Status post intubation and mechanical ventilation.  He is tolerating room air.  He completed a course of IV Rocephin and vancomycin.   No growth on blood cultures.  DKA with poorly controlled DM type 1 with hyperglycemia. DKA has resolved.  Glucose levels continue to fluctuate but overall it has improved.  Continue NovoLog Mix 70/30.    NovoLog as needed for severe hyperglycemia. Hemoglobin A1c of 8.2 on 9/01//2021.   Metabolic encephalopathy with chronic hydrocephalus. Hx of Bipolar, depression,  anxiety, intermittent agitation. On Depakote and Lexapro at home.    He was started on Seroquel 09/09/2020. Gabapentin is still on hold.  Patient was seen by neurology.  Patient does have chronic hydrocephalus.  CT head with marked hydrocephalus involving third and lateral ventricles consistent with aqueductal stenosis.  Neurosurgeon does not recommend any surgical intervention.    He was evaluated by the psychiatrist on 09/11/2020 because he he said he was suicidal.  Psychiatrist determined that patient was not an imminent risk to self or others so suicide precaution/one-to-one watch was discontinued.  IM Haldol as needed for agitation.  Dysphagia following intubation.  Continue dysphagia 3 diet.  Follow-up with speech therapist. Advance diet as per speech therapy.  Continue Protonix.   Hypokalemia, hypomagnesemia, hypophosphatemia. Improved with replacement.    Anemia of chronic disease. H&H is stable.  Homelessness. Patient however states that he lives with his friend Mark RuizJohn currently.  History of hyperlipidemia  Continue Lipitor  Hypothyroidism. Continue Synthroid.  TSH was elevated at 14.4.  Will need to monitor closely as outpatient.  Possibility of noncompliance.    Deconditioning, debility with right-sided weakness. PT, OT on board.  Recommended CIR. Assessment and evaluation for discharge to inpatient rehab is still pending.   Patient has been advised not to leave AGAINST MEDICAL ADVICE since he has no safe discharge plan in place at this time.  Arrangements are been made for possible discharge to inpatient rehab.   Diet Order            Diet Carb Modified Fluid consistency: Thin; Room service appropriate? Yes  Diet effective now  Consultants:  Intensivist  Neurologist  Psychiatrist  Procedures:  Intubation and mechanical ventilation on 09/02/2020    Medications:   . atorvastatin  20 mg Oral Daily  . Chlorhexidine Gluconate  Cloth  6 each Topical Daily  . divalproex  500 mg Oral Daily  . divalproex  750 mg Oral QPM  . escitalopram  20 mg Oral Daily  . feeding supplement (GLUCERNA SHAKE)  237 mL Oral TID BM  . insulin aspart  0-15 Units Subcutaneous TID WC  . insulin aspart  0-5 Units Subcutaneous QHS  . insulin aspart protamine- aspart  35 Units Subcutaneous BID WC  . levothyroxine  50 mcg Oral Q0600  . pantoprazole  40 mg Oral Q1200  . QUEtiapine  300 mg Oral QHS   Continuous Infusions:   Anti-infectives (From admission, onward)   Start     Dose/Rate Route Frequency Ordered Stop   09/03/20 0945  cefTRIAXone (ROCEPHIN) 2 g in sodium chloride 0.9 % 100 mL IVPB        2 g 200 mL/hr over 30 Minutes Intravenous Every 24 hours 09/03/20 0854 09/06/20 1126   09/03/20 0800  ceFEPIme (MAXIPIME) 2 g in sodium chloride 0.9 % 100 mL IVPB  Status:  Discontinued        2 g 200 mL/hr over 30 Minutes Intravenous Every 24 hours 09/02/20 0742 09/02/20 1331   09/02/20 2200  vancomycin (VANCOREADY) IVPB 750 mg/150 mL  Status:  Discontinued        750 mg 150 mL/hr over 60 Minutes Intravenous Every 12 hours 09/02/20 1329 09/03/20 0854   09/02/20 1800  ceFEPIme (MAXIPIME) 2 g in sodium chloride 0.9 % 100 mL IVPB  Status:  Discontinued        2 g 200 mL/hr over 30 Minutes Intravenous Every 8 hours 09/02/20 1331 09/03/20 0854   09/02/20 0745  ceFEPIme (MAXIPIME) 2 g in sodium chloride 0.9 % 100 mL IVPB        2 g 200 mL/hr over 30 Minutes Intravenous  Once 09/02/20 0732 09/02/20 0932   09/02/20 0745  vancomycin (VANCOREADY) IVPB 2000 mg/400 mL        2,000 mg 200 mL/hr over 120 Minutes Intravenous  Once 09/02/20 0733 09/02/20 1257   09/02/20 0741  vancomycin variable dose per unstable renal function (pharmacist dosing)  Status:  Discontinued         Does not apply See admin instructions 09/02/20 0742 09/02/20 1329   09/02/20 0730  aztreonam (AZACTAM) 2 g in sodium chloride 0.9 % 100 mL IVPB  Status:  Discontinued        2  g 200 mL/hr over 30 Minutes Intravenous  Once 09/02/20 0728 09/02/20 0732   09/02/20 0730  metroNIDAZOLE (FLAGYL) IVPB 500 mg        500 mg 100 mL/hr over 60 Minutes Intravenous  Once 09/02/20 0728 09/02/20 1040   09/02/20 0730  vancomycin (VANCOCIN) IVPB 1000 mg/200 mL premix  Status:  Discontinued        1,000 mg 200 mL/hr over 60 Minutes Intravenous  Once 09/02/20 0728 09/02/20 0733             Family Communication/Anticipated D/C date and plan/Code Status   DVT prophylaxis: SCDs Start: 09/02/20 0844     Code Status: Full Code  Family Communication: None Disposition Plan:    Status is: Inpatient  Remains inpatient appropriate because:Unsafe d/c plan   Dispo: The patient is from: Home  Anticipated d/c is to: Home              Anticipated d/c date is: 1 day              Patient currently is medically stable to d/c.           Subjective:   Interval events noted.  Patient said he prefers to go home.  Objective:    Vitals:   09/12/20 2343 09/13/20 0344 09/13/20 0829 09/13/20 1237  BP: 118/73 112/70 101/68 116/81  Pulse: 77 79 82 (!) 103  Resp: 16 16 16 16   Temp: 98 F (36.7 C) 98.1 F (36.7 C) 98.5 F (36.9 C) 98.4 F (36.9 C)  TempSrc: Oral Oral Oral Oral  SpO2: 98% 98% 96% 99%  Weight:      Height:       No data found.   Intake/Output Summary (Last 24 hours) at 09/13/2020 1423 Last data filed at 09/13/2020 1010 Gross per 24 hour  Intake 740 ml  Output 2370 ml  Net -1630 ml   Filed Weights   09/07/20 0500 09/08/20 0500 09/09/20 0453  Weight: 103 kg 102.2 kg 97.4 kg    Exam:  GEN: NAD SKIN: Warm and dry EYES: No pallor or icterus ENT: MMM CV: RRR PULM: CTA B ABD: soft, ND, NT, +BS CNS: AAO x 3, non focal EXT: No edema or tenderness    Data Reviewed:   I have personally reviewed following labs and imaging studies:  Labs: Labs show the following:   Basic Metabolic Panel: Recent Labs  Lab 09/07/20 0105  09/07/20 0105 09/08/20 0327 09/08/20 0327 09/10/20 0229 09/11/20 0158 09/11/20 0951  NA 144  --  142  --  135 136  --   K 3.7   < > 3.9   < > 4.6 4.2  --   CL 108  --  107  --  101 103  --   CO2 27  --  26  --  23 25  --   GLUCOSE 223*  --  205*  --  418* 326* 588*  BUN 8  --  10  --  14 14  --   CREATININE 0.79  --  0.82  --  0.92 0.94  --   CALCIUM 9.2  --  9.2  --  9.2 9.3  --   MG 1.7  --   --   --  1.7 1.7  --   PHOS 2.7  --   --   --   --   --   --    < > = values in this interval not displayed.   GFR Estimated Creatinine Clearance: 126.3 mL/min (by C-G formula based on SCr of 0.94 mg/dL). Liver Function Tests: Recent Labs  Lab 09/10/20 0229  AST 17  ALT 21  ALKPHOS 63  BILITOT 0.5  PROT 6.4*  ALBUMIN 2.9*   No results for input(s): LIPASE, AMYLASE in the last 168 hours. No results for input(s): AMMONIA in the last 168 hours. Coagulation profile No results for input(s): INR, PROTIME in the last 168 hours.  CBC: Recent Labs  Lab 09/10/20 0229 09/11/20 0158  WBC 9.7 10.1  HGB 11.8* 11.8*  HCT 35.3* 35.0*  MCV 89.6 89.1  PLT 344 383   Cardiac Enzymes: No results for input(s): CKTOTAL, CKMB, CKMBINDEX, TROPONINI in the last 168 hours. BNP (last 3 results) No results for input(s): PROBNP in the last 8760 hours. CBG: Recent Labs  Lab 09/12/20 1745 09/12/20 2055 09/13/20 0607 09/13/20 0822 09/13/20 1240  GLUCAP 234* 173* 239* 272* 212*   D-Dimer: No results for input(s): DDIMER in the last 72 hours. Hgb A1c: No results for input(s): HGBA1C in the last 72 hours. Lipid Profile: No results for input(s): CHOL, HDL, LDLCALC, TRIG, CHOLHDL, LDLDIRECT in the last 72 hours. Thyroid function studies: No results for input(s): TSH, T4TOTAL, T3FREE, THYROIDAB in the last 72 hours.  Invalid input(s): FREET3 Anemia work up: No results for input(s): VITAMINB12, FOLATE, FERRITIN, TIBC, IRON, RETICCTPCT in the last 72 hours. Sepsis Labs: Recent Labs  Lab  09/10/20 0229 09/11/20 0158  WBC 9.7 10.1    Microbiology Recent Results (from the past 240 hour(s))  Culture, respiratory (tracheal aspirate)     Status: None   Collection Time: 09/06/20  2:41 PM   Specimen: Tracheal Aspirate; Respiratory  Result Value Ref Range Status   Specimen Description TRACHEAL ASPIRATE  Final   Special Requests NONE  Final   Gram Stain   Final    RARE WBC PRESENT, PREDOMINANTLY MONONUCLEAR NO ORGANISMS SEEN    Culture   Final    FEW Normal respiratory flora-no Staph aureus or Pseudomonas seen Performed at [facility], [address] [place], [place] [address]    Report Status 09/08/2020 FINAL  Final    Procedures and diagnostic studies:  No results found.             LOS: 11 days   Brennon Otterness  [employer] loss adjuster on [email or web address]. If 7PM-7AM, please contact night-coverage at [email or web address]     09/13/2020, 2:23 PM

## 2020-09-13 NOTE — Progress Notes (Addendum)
Inpatient Rehab Admissions Coordinator:   Met with patient at bedside. He continues to adamantly refuse inpatient rehab.  Will sign off and let TOC know.   Shann Medal, PT, DPT Admissions Coordinator [phone] 09/13/20  1:23 PM

## 2020-09-13 NOTE — Progress Notes (Signed)
  Speech Language Pathology Treatment: Dysphagia  Patient Details Name: LINWOOD GULLIKSON MRN: [id number] DOB: 04-Nov-1985 Today's Date: 09/13/2020 Time: 1205-1220 SLP Time Calculation (min) (ACUTE ONLY): 15 min  Assessment / Plan / Recommendation Clinical Impression  Patient seen to address dysphagia goals and for trial of upgraded solids. Patient consumed regular solids meal (lunch tray) without significant difficulty with mastication and management of solids. He exhibited intermittent delayed and/or immediate dry cough with throat clear, which he has been exhibiting with PO's. Voice remains clear throughout and patient overall appears to be tolerating upgraded solid textures and thin liquids. He continues to be impulsive with his oral intake. Plan to upgrade to regular texture solids, continue with thin liquids and SLP to continue to follow patient for toleration of diet and cognitive goals.    HPI HPI: Pt is a 34 yo male admitted with AMS, sonorous respirations, and concern for aspiration in the setting of DKA. Pt was intubated 11/12; self-extubated 11/16. PMH includes: DM type 1, anxiety, bipolar, depression, DM neuropathy, nephrolithiasis, seizures, hydrocephalus      SLP Plan  Continue with current plan of care       Recommendations  Diet recommendations: Regular;Thin liquid Liquids provided via: Cup;No straw Medication Administration: Whole meds with puree Supervision: Staff to assist with self feeding;Full supervision/cueing for compensatory strategies Compensations: Minimize environmental distractions;Slow rate;Small sips/bites Postural Changes and/or Swallow Maneuvers: Seated upright 90 degrees                Oral Care Recommendations: Oral care BID Follow up Recommendations: Other (comment);24 hour supervision/assistance (Pending progress) SLP Visit Diagnosis: Dysphagia, unspecified (R13.10) Plan: Continue with current plan of care       GO                Angela Nevin, MA, CCC-SLP Speech Therapy [facility]

## 2020-09-13 NOTE — Progress Notes (Signed)
Nutrition Follow-up  DOCUMENTATION CODES:   Not applicable  INTERVENTION:  Continue Glucerna Shake po TID, each supplement provides 220 kcal and 10 grams of protein  Continue to assist pt with meals as needed  Continue to encourage adequate po intake   NUTRITION DIAGNOSIS:   Inadequate oral intake related to inability to eat as evidenced by NPO status.  Progressing, pt now on dysphagia 3 diet with thin liquids    GOAL:   Patient will meet greater than or equal to 90% of their needs  progressing  MONITOR:   Diet advancement  REASON FOR ASSESSMENT:   Consult Enteral/tube feeding initiation and management  ASSESSMENT:   Pt admitted with AMS and sonorous respirations from DKA. Pt required intubation in the setting of compromised airway with concern for aspiration. PMH includes type 1 DM, anxiety/depression, bipolar disorder, nephrolithiasis, seizures, hydrocephalus. Pt is reportedly intermittently homeless.  11/12 intubated; OG placed (tip in stomach per xray) 11/13 off insulin gtt 11/16 self-extubated  Per MD, DKA has resolved, though pt continues to have intermittent agitation. RN reports pt has brought up potentially leaving AMA.   Pt continues on dysphagia 3 diet and has had good po intake since last RD assessment. Meal completions charted as 50-100% x 6 recorded meals (83% average meal intake). Pt continues to receive Glucerna TID and is consuming well.   UOP: x24 hours  Labs: CBGs 173-272 (Diabetes Coordinator following) Medications: ss novolog, 35 units novolog mix 70/30 BID  Diet Order:   Diet Order            DIET DYS 3 Room service appropriate? Yes with Assist; Fluid consistency: Thin  Diet effective now                 EDUCATION NEEDS:   Not appropriate for education at this time  Skin:  Skin Assessment: Reviewed RN Assessment  Last BM:  11/22  Height:   Ht Readings from Last 1 Encounters:  09/02/20 6\' 2"  (1.88 m)    Weight:    Wt Readings from Last 1 Encounters:  09/09/20 97.4 kg    BMI:  Body mass index is 27.57 kg/m.  Estimated Nutritional Needs:   Kcal:  2400-2600  Protein:  125-145 grams  Fluid:  >2L    09/11/20, MS, RD, LDN RD pager number and weekend/on-call pager number located in Amion.

## 2020-09-13 NOTE — Progress Notes (Signed)
Occupational Therapy Treatment Patient Details Name: Mark [name] MRN: [id number] DOB: Jul 12, 1986 Today's Date: 09/13/2020    History of present illness 34 year old male with history of seizures on Depakote, type 1 diabetes with frequent episodes of DKA, hydrocephalus who presents to the emergency department on 11/12 with altered mental status with CBG in 500s and sonorous breath sounds.   OT comments  Pt seen in conjunction with PT to maximize pts activity tolerance. Pt continues to present with impaired balance, decreased activity tolerance, impaired strength and coordination impacting pts ability to complete BADLs. Pt much more conversant and pleasant today in comparison to previous sessions. Overall, pt required up to MOD A for bed mobility and MIN A +2 for safety for sit<>stand with stedy. Utilized mirror to provide visual feedback to assist pt with self correct sitting/ standing balance. Pt completed UB ADLs from chair with s/u assist. Continue to highly recommend CIR for DC. Will follow acutely per POC.   Follow Up Recommendations  CIR    Equipment Recommendations  Other (comment) (TBD)    Recommendations for Other Services      Precautions / Restrictions Precautions Precautions: Fall Restrictions Weight Bearing Restrictions: No       Mobility Bed Mobility Overal bed mobility: Needs Assistance Bed Mobility: Rolling;Sidelying to Sit Rolling: Min guard Sidelying to sit: Min assist;Mod assist;HOB elevated       General bed mobility comments: pt able to roll to pts L side with min guard; pt reaching out for therapist to assist with elevating trunk needing MIN A however pt required intital MOD A for sitting balance once EOB  Transfers Overall transfer level: Needs assistance Equipment used: Ambulation equipment used Transfers: Sit to/from Stand Sit to Stand: Min guard;Min assist;+2 safety/equipment;From elevated surface         General transfer comment: pt  completed x4 sit<>stands during session, MIN A from EOB with cues for hand placement from elevated EOB. pt progressing to min guard from seat of stedy needing cues to elevate trunk and bring hips into full extension from standing in stedy    Balance Overall balance assessment: Needs assistance Sitting-balance support: Feet supported;Single extremity supported Sitting balance-Leahy Scale: Poor Sitting balance - Comments: close min guard with one UE supported, pt with better awarness into sitting balance deficits able to self correct especially with  visual feedback from mirror   Standing balance support: Single extremity supported Standing balance-Leahy Scale: Poor Standing balance comment: pt requires at least one UE supported during static standing and close minguard                           ADL either performed or assessed with clinical judgement   ADL Overall ADL's : Needs assistance/impaired     Grooming: Brushing hair;Sitting;Set up Grooming Details (indicate cue type and reason): pt able to brush hair from sitting in recliner with s/u assist                 Toilet Transfer: Min guard Toilet Transfer Details (indicate cue type and reason): via Stedy, simulated via transfer to recliner with min guard to stand to stedy         Functional mobility during ADLs: Min guard;+2 for physical assistance (sit<>stand with stedy only) General ADL Comments: pt continues to present with balance deficits, decreased activity tolerance and generalized weakness RLE>LLE     Vision       Perception     Praxis  Cognition Arousal/Alertness: Awake/alert Behavior During Therapy: WFL for tasks assessed/performed Overall Cognitive Status: No family/caregiver present to determine baseline cognitive functioning                                 General Comments: more attentive and pleasant in comparison to previous sessions.        Exercises     Shoulder  Instructions       General Comments      Pertinent Vitals/ Pain       Pain Assessment: No/denies pain  Home Living                                          Prior Functioning/Environment              Frequency  Min 2X/week        Progress Toward Goals  OT Goals(current goals can now be found in the care plan section)  Progress towards OT goals: Progressing toward goals  Acute Rehab OT Goals Patient Stated Goal: to improve OT Goal Formulation: With patient Time For Goal Achievement: 09/21/20 Potential to Achieve Goals: Good  Plan Discharge plan remains appropriate;Frequency remains appropriate    Co-evaluation      Reason for Co-Treatment: Complexity of the patient's impairments (multi-system involvement);For patient/therapist safety;To address functional/ADL transfers;Necessary to address cognition/behavior during functional activity   OT goals addressed during session: ADL's and self-care      AM-PAC OT "6 Clicks" Daily Activity     Outcome Measure   Help from another person eating meals?: A Little Help from another person taking care of personal grooming?: A Little Help from another person toileting, which includes using toliet, bedpan, or urinal?: A Lot Help from another person bathing (including washing, rinsing, drying)?: A Lot Help from another person to put on and taking off regular upper body clothing?: A Little Help from another person to put on and taking off regular lower body clothing?: A Lot 6 Click Score: 15    End of Session Equipment Utilized During Treatment: Gait belt;Other (comment) (stedy)  OT Visit Diagnosis: Other abnormalities of gait and mobility (R26.89);Muscle weakness (generalized) (M62.81);Unsteadiness on feet (R26.81);Other symptoms and signs involving cognitive function   Activity Tolerance Patient tolerated treatment well   Patient Left in chair;with call bell/phone within reach;with chair alarm set;Other  (comment) (SLP entering)   Nurse Communication Mobility status        Time: 3295-1884 OT Time Calculation (min): 23 min  Charges: OT General Charges $OT Visit: 1 Visit OT Treatments $Therapeutic Activity: 8-22 mins  Audery Amel., COTA/L Acute Rehabilitation Services [phone]    Angelina Pih 09/13/2020, 1:01 PM

## 2020-09-14 DIAGNOSIS — F419 Anxiety disorder, unspecified: Secondary | ICD-10-CM

## 2020-09-14 DIAGNOSIS — E039 Hypothyroidism, unspecified: Secondary | ICD-10-CM

## 2020-09-14 DIAGNOSIS — F32A Depression, unspecified: Secondary | ICD-10-CM

## 2020-09-14 DIAGNOSIS — R5381 Other malaise: Secondary | ICD-10-CM

## 2020-09-14 DIAGNOSIS — G9341 Metabolic encephalopathy: Secondary | ICD-10-CM

## 2020-09-14 DIAGNOSIS — F319 Bipolar disorder, unspecified: Secondary | ICD-10-CM

## 2020-09-14 LAB — GLUCOSE, CAPILLARY
Glucose-Capillary: 292 mg/dL — ABNORMAL HIGH (ref 70–99)
Glucose-Capillary: 163 mg/dL — ABNORMAL HIGH (ref 70–99)
Glucose-Capillary: 156 mg/dL — ABNORMAL HIGH (ref 70–99)
Glucose-Capillary: 346 mg/dL — ABNORMAL HIGH (ref 70–99)

## 2020-09-14 MED ORDER — INSULIN NPH (HUMAN) (ISOPHANE) 100 UNIT/ML ~~LOC~~ SUSP
35.0000 [IU] | Freq: Two times a day (BID) | SUBCUTANEOUS | Status: DC
  Administered 2020-09-14 – 2020-09-17 (×6): 35 [IU] via SUBCUTANEOUS
  Filled 2020-09-14: qty 10

## 2020-09-14 NOTE — Progress Notes (Signed)
Inpatient Diabetes Program Recommendations  AACE/ADA: New Consensus Statement on Inpatient Glycemic Control (2015)  Target Ranges:  Prepandial:   less than 140 mg/dL      Peak postprandial:   less than 180 mg/dL (1-2 hours)      Critically ill patients:  140 - 180 mg/dL   Lab Results  Component Value Date   GLUCAP 163 (H) 09/14/2020   HGBA1C 8.2 (H) 06/22/2020    Review of Glycemic Control Results for Mark [name], Mark [name] (MRN [id number]) as of 09/14/2020 12:46  Ref. Range 09/13/2020 08:22 09/13/2020 12:40 09/13/2020 15:42 09/13/2020 16:12 09/13/2020 21:15 09/14/2020 06:18 09/14/2020 11:13  Glucose-Capillary Latest Ref Range: 70 - 99 mg/dL 893 (H) 810 (H) 54 (L) 94 77 292 (H) 163 (H)   Diabetes history: DM 1 Outpatient Diabetes medications:  Novolog 70/30 mix 35 units bid Current orders for Inpatient glycemic control:  NPH 35 units bid Novolog moderate tid with meals and HS  Inpatient Diabetes Program Recommendations:    Note that 70/30 switched to NPH today.  Consider reducing NPH to 25 units bid (this is the NPH portion of the 70/30).   Thanks,  Beryl Meager, RN, BC-ADM Inpatient Diabetes Coordinator Pager [phone] (8a-5p)

## 2020-09-14 NOTE — Progress Notes (Signed)
PROGRESS NOTE  Mark [name] PXT:[id number] DOB: [birth year]   PCP: Patient, No Pcp Per  Patient is from: Home.  Reportedly lives with his friend. He says he co-owns the house  DOA: 09/02/2020 LOS: 12  Chief complaints: Altered mental status  Brief Narrative / Interim history: 34 year old male with history of DM-1, anxiety, depression, bipolar disorder, seizure disorder, hydrocephalus and tobacco use disorder brought to ED with altered mental status and found to be in severe DKA with encephalopathy requiring intubation and mechanical ventilation from 11/12-11/14. CT head showed marked hydrocephalus with acute artery duct stenosis and right frontal ventriculostomy site with drain.  Neurosurgery does not recommend any surgical intervention.  He was also treated for aspiration pneumonia with IV ceftriaxone and vancomycin.    He is currently on room air and dysphagia 3 diet.  Completed antibiotic course for pneumonia.  Therapy recommended CIR.  Initially he refused to CIR but now in agreement.  CIR to reassess.   Subjective: Seen and examined earlier this morning.  No major events overnight of this morning.  No complaints.  He denies headache, vision change, chest pain, shortness of breath, GI, GU GI or focal neuro symptoms.  He is awake and oriented x4.  Initially, he says he did not think he needed CIR but after discussion about his physical deconditioning MD the benefit of CIR, he agreed to go CIR if accepted.  Objective: Vitals:   09/13/20 2022 09/14/20 0024 09/14/20 0809 09/14/20 1116  BP: 105/62 106/63 110/79 115/82  Pulse: 77 77 85 91  Resp: 18 18 19 18   Temp:  98 F (36.7 C) 98.4 F (36.9 C) 98.2 F (36.8 C)  TempSrc: Oral Oral Oral Oral  SpO2: 100% 99% 98% 98%  Weight:      Height:        Intake/Output Summary (Last 24 hours) at 09/14/2020 1204 Last data filed at 09/13/2020 2146 Gross per 24 hour  Intake 480 ml  Output 300 ml  Net 180 ml   Filed Weights   09/07/20  0500 09/08/20 0500 09/09/20 0453  Weight: 103 kg 102.2 kg 97.4 kg    Examination:  GENERAL: No apparent distress.  Nontoxic. HEENT: MMM.  Vision and hearing grossly intact.  NECK: Supple.  No apparent JVD.  RESP: On RA.  No IWOB.  Fair aeration bilaterally. CVS:  RRR. Heart sounds normal.  ABD/GI/GU: BS+. Abd soft, NTND.  MSK/EXT:  Moves extremities. No apparent deformity. No edema.  SKIN: no apparent skin lesion or wound NEURO: Awake, alert and oriented x4.  No apparent focal neuro deficit. PSYCH: Calm. Normal affect.  Procedures:  EKG 11/12-11/14  Microbiology summarized: COVID-19, influenza and RSV PCR nonreactive. MRSA PCR negative. Blood cultures NGTD. Respiratory culture normal respiratory flora.  Assessment & Plan: Acute hypoxic respiratory failure with compromised airway and aspiration pneumonitis: Resolved. -S/p intubation and mechanical ventilation 11/12-11/14.  Currently on room air. -Completed antibiotic course with Rocephin and vancomycin. -Aspiration precautions  Uncontrolled DM-1 with severe DKA and hyperglycemia: A1c 8.2%.  DKA resolved.  Labile CBG Recent Labs  Lab 09/13/20 1542 09/13/20 1612 09/13/20 2115 09/14/20 0618 09/14/20 1113  GLUCAP 54* 94 77 292* 163*  -Change 70/30 to NPH at 35 units twice daily while on SSI moderate. -Continue SSI moderate  Acute metabolic encephalopathy: Likely due to the above.  Seems to have resolved.  He is oriented x4 but does not seem to have great insight. -Reorientation and delirium precautions  Anxiety/depression/bipolar disorder: Seems to be stable.  He endorsed and evaluated by psych on 09/11/2020.  Psychiatry did not think he is an imminent risk to self or others -Continue Seroquel, Depakote and Lexapro -IM Haldol as needed for agitation.  Chronic hydrocephalus: CT head showed marked hydrocephalus with acute artery duct stenosis and right frontal ventriculostomy site with drain. Neurosurgeon does not  recommend any surgical intervention.    Dysphagia following intubation: SLP recommended dysphagia 3 diet. -Continue dysphagia 3 diet and SLP evaluation -Continue Protonix.  Hypokalemia, hypomagnesemia, hypophosphatemia: seems to have resolved but no recent labs. -Monitor intermittently  Anemia ofchronic disease: H&H stable. -Monitor intermittently  Homelessness? -patient lives with his friend [name].  They co-own the house. Confirmed with [name]  History of hyperlipidemia  -Continue Lipitor  Hypothyroidism: TSH was elevated at 14.4. Possibility of noncompliance.  -Continue Synthroid 50 mcg daily -Recheck TSH in 3 to 4 weeks  Deconditioning, debility: Therapy recommended CIR. Initially refused CIR but changed his mind  -We will ask CIR to re-evaluate   Body mass index is 27.57 kg/m. Nutrition Problem: Inadequate oral intake Etiology: inability to eat Signs/Symptoms: NPO status Interventions: Refer to RD note for recommendations   DVT prophylaxis:  SCDs Start: 09/02/20 0844  Code Status: Full code Family Communication: Updated patient's friend, [name] over the phone with patient's permission Status is: Inpatient  Remains inpatient appropriate because:Unsafe d/c plan   Dispo: The patient is from: Home              Anticipated d/c is to: CIR              Anticipated d/c date is: 1 day              Patient currently is medically stable to d/c.       Consultants:  Neurosurgery PCCM Psychiatry   Sch Meds:  Scheduled Meds: . atorvastatin  20 mg Oral Daily  . Chlorhexidine Gluconate Cloth  6 each Topical Daily  . divalproex  500 mg Oral Daily  . divalproex  750 mg Oral QPM  . escitalopram  20 mg Oral Daily  . feeding supplement (GLUCERNA SHAKE)  237 mL Oral TID BM  . insulin aspart  0-15 Units Subcutaneous TID WC  . insulin aspart  0-5 Units Subcutaneous QHS  . insulin aspart protamine- aspart  35 Units Subcutaneous BID WC  . levothyroxine  50 mcg  Oral Q0600  . pantoprazole  40 mg Oral Q1200  . QUEtiapine  300 mg Oral QHS   Continuous Infusions: PRN Meds:.acetaminophen, docusate, haloperidol lactate, polyethylene glycol  Antimicrobials: Anti-infectives (From admission, onward)   Start     Dose/Rate Route Frequency Ordered Stop   09/03/20 0945  cefTRIAXone (ROCEPHIN) 2 g in sodium chloride 0.9 % 100 mL IVPB        2 g 200 mL/hr over 30 Minutes Intravenous Every 24 hours 09/03/20 0854 09/06/20 1126   09/03/20 0800  ceFEPIme (MAXIPIME) 2 g in sodium chloride 0.9 % 100 mL IVPB  Status:  Discontinued        2 g 200 mL/hr over 30 Minutes Intravenous Every 24 hours 09/02/20 0742 09/02/20 1331   09/02/20 2200  vancomycin (VANCOREADY) IVPB 750 mg/150 mL  Status:  Discontinued        750 mg 150 mL/hr over 60 Minutes Intravenous Every 12 hours 09/02/20 1329 09/03/20 0854   09/02/20 1800  ceFEPIme (MAXIPIME) 2 g in sodium chloride 0.9 % 100 mL IVPB  Status:  Discontinued  2 g 200 mL/hr over 30 Minutes Intravenous Every 8 hours 09/02/20 1331 09/03/20 0854   09/02/20 0745  ceFEPIme (MAXIPIME) 2 g in sodium chloride 0.9 % 100 mL IVPB        2 g 200 mL/hr over 30 Minutes Intravenous  Once 09/02/20 0732 09/02/20 0932   09/02/20 0745  vancomycin (VANCOREADY) IVPB 2000 mg/400 mL        2,000 mg 200 mL/hr over 120 Minutes Intravenous  Once 09/02/20 0733 09/02/20 1257   09/02/20 0741  vancomycin variable dose per unstable renal function (pharmacist dosing)  Status:  Discontinued         Does not apply See admin instructions 09/02/20 0742 09/02/20 1329   09/02/20 0730  aztreonam (AZACTAM) 2 g in sodium chloride 0.9 % 100 mL IVPB  Status:  Discontinued        2 g 200 mL/hr over 30 Minutes Intravenous  Once 09/02/20 0728 09/02/20 0732   09/02/20 0730  metroNIDAZOLE (FLAGYL) IVPB 500 mg        500 mg 100 mL/hr over 60 Minutes Intravenous  Once 09/02/20 0728 09/02/20 1040   09/02/20 0730  vancomycin (VANCOCIN) IVPB 1000 mg/200 mL premix   Status:  Discontinued        1,000 mg 200 mL/hr over 60 Minutes Intravenous  Once 09/02/20 0728 09/02/20 0733       I have personally reviewed the following labs and images: CBC: Recent Labs  Lab 09/10/20 0229 09/11/20 0158  WBC 9.7 10.1  HGB 11.8* 11.8*  HCT 35.3* 35.0*  MCV 89.6 89.1  PLT 344 383   BMP &GFR Recent Labs  Lab 09/08/20 0327 09/10/20 0229 09/11/20 0158 09/11/20 0951  NA 142 135 136  --   K 3.9 4.6 4.2  --   CL 107 101 103  --   CO2 26 23 25   --   GLUCOSE 205* 418* 326* 588*  BUN 10 14 14   --   CREATININE 0.82 0.92 0.94  --   CALCIUM 9.2 9.2 9.3  --   MG  --  1.7 1.7  --    Estimated Creatinine Clearance: 126.3 mL/min (by C-G formula based on SCr of 0.94 mg/dL). Liver & Pancreas: Recent Labs  Lab 09/10/20 0229  AST 17  ALT 21  ALKPHOS 63  BILITOT 0.5  PROT 6.4*  ALBUMIN 2.9*   No results for input(s): LIPASE, AMYLASE in the last 168 hours. No results for input(s): AMMONIA in the last 168 hours. Diabetic: No results for input(s): HGBA1C in the last 72 hours. Recent Labs  Lab 09/13/20 1542 09/13/20 1612 09/13/20 2115 09/14/20 0618 09/14/20 1113  GLUCAP 54* 94 77 292* 163*   Cardiac Enzymes: No results for input(s): CKTOTAL, CKMB, CKMBINDEX, TROPONINI in the last 168 hours. No results for input(s): PROBNP in the last 8760 hours. Coagulation Profile: No results for input(s): INR, PROTIME in the last 168 hours. Thyroid Function Tests: No results for input(s): TSH, T4TOTAL, FREET4, T3FREE, THYROIDAB in the last 72 hours. Lipid Profile: No results for input(s): CHOL, HDL, LDLCALC, TRIG, CHOLHDL, LDLDIRECT in the last 72 hours. Anemia Panel: No results for input(s): VITAMINB12, FOLATE, FERRITIN, TIBC, IRON, RETICCTPCT in the last 72 hours. Urine analysis:    Component Value Date/Time   COLORURINE YELLOW 09/02/2020 0616   APPEARANCEUR HAZY (A) 09/02/2020 0616   APPEARANCEUR Clear 01/20/2015 2136   LABSPEC 1.013 09/02/2020 0616    LABSPEC 1.023 01/20/2015 2136   PHURINE 5.0 09/02/2020 2137  GLUCOSEU >=500 (A) 09/02/2020 0616   GLUCOSEU >=500 01/20/2015 2136   HGBUR SMALL (A) 09/02/2020 0616   BILIRUBINUR NEGATIVE 09/02/2020 0616   BILIRUBINUR Negative 01/20/2015 2136   KETONESUR 5 (A) 09/02/2020 0616   PROTEINUR 30 (A) 09/02/2020 0616   NITRITE NEGATIVE 09/02/2020 0616   LEUKOCYTESUR NEGATIVE 09/02/2020 0616   LEUKOCYTESUR Negative 01/20/2015 2136   Sepsis Labs: Invalid input(s): PROCALCITONIN, LACTICIDVEN  Microbiology: Recent Results (from the past 240 hour(s))  Culture, respiratory (tracheal aspirate)     Status: None   Collection Time: 09/06/20  2:41 PM   Specimen: Tracheal Aspirate; Respiratory  Result Value Ref Range Status   Specimen Description TRACHEAL ASPIRATE  Final   Special Requests NONE  Final   Gram Stain   Final    RARE WBC PRESENT, PREDOMINANTLY MONONUCLEAR NO ORGANISMS SEEN    Culture   Final    FEW Normal respiratory flora-no Staph aureus or Pseudomonas seen Performed at [facility], [address] [place], [place] [address]    Report Status 09/08/2020 FINAL  Final    Radiology Studies: No results found.   Mark Mark [name] [facility]  If 7PM-7AM, please contact night-coverage [email or web address] 09/14/2020, 12:04 PM

## 2020-09-14 NOTE — Progress Notes (Signed)
Nurse was getting patient up to the bathroom, trying to use the steady with patient sitting at the edge of the bed. patient got frustrated and trew himself back on the bed and hit his head on the side rail of the bed. Neuro assessment is normal, no broken skin, MD and Charge nurse notified.

## 2020-09-14 NOTE — Progress Notes (Signed)
Paged by patient's RN about patient asking to leave AMA.  Went by his room and talk to patient.  Patient states, "I am tired of being here for long".  He understand his weakness, risk of fall with life-threatening injury if he lives.  He says, "I could fall and bust my butt".  I also told him the risk of fall and severe head injury that could potentially lead to death and irreversible debility.  He says "that can happen to anyone".  He noted that he cannot even get out of the bed safely.  He asked if we can help him and give him a bus pass.   I kindly told him that we cannot provide those. He is unhappy but going to stay as he knew he is too weak to leave the hospital on his own.  Patient is awake, alert and oriented x4.  At this point, patient seems to have medical decision-making capacity to my assessment but making poor decision if he leaves AMA.  I have also consulted psych to assess him given his underlying psych history.  However, I will not involuntarily commit him to stay in the hospital.   Informed patient's RN over the phone.

## 2020-09-14 NOTE — Consult Note (Signed)
[facility] Face-to-Face Psychiatry Consult   Reason for Consult: Keeps threatening to leave AMA, mental capacity, Placey has some underlying psychiatric issues Referring Physician:  Internal  Med Patient Identification: Mark [name] MRN:  [id number] Principal Diagnosis: AMS (altered mental status) Diagnosis:  Principal Problem:   AMS (altered mental status) Active Problems:   Diabetic keto-acidosis (HCC)   Obstructive hydrocephalus (HCC)   Bipolar I disorder, most recent episode depressed, severe without psychotic features (HCC)   Total Time spent with patient: 15 minutes  Subjective:   Mark [name] is a 34 y.o. male once evaluated by this nurse practitioner.  Patient is alert and oriented, calm and cooperative yet appears to be frustrated  .  "I been here too long I am just ready to go.  I do not know why it is taking so long for me to get out of here.  Speeded up and get off your Butts.  Patient does report a history of depression and suicidal thoughts that are passive in nature.  He denies having a plan, and or intent.  He reports just wanting to go home.  He also states being in the hospital has made him even more depressed and suicidal.,  Yet in the same sentence he wants treatment.  He denies any current or active suicidal ideation.  He denies any homicidal ideation and or hallucinations.  He answers all questions appropriately, and does not appear to be responding to internal stimuli.    HPI:  Per admission assessment note: Patient is a 34 year old male with history of seizures on Depakote, type 1 diabetes with frequent episodes of DKA, hydrocephalus who presents to the emergency department with altered mental status.  Per EMS, patient's roommate called 911.  Reportedly patient was found unresponsive just prior to arrival.  Last seen normal at 3 AM.  Blood glucose in the 500s with EMS.  Patient tachycardic, tachypneic with rhonchorous breath sounds.  Currently on a nonrebreather with a  right NPA in place.  No known history of trauma.  Roommate denies history of drug or alcohol abuse.   Psych consult was placed for mental capacity, as patient continues to threaten to leave AMA.  Patient reports he was just assessed by his medical team, this was confirmed with chart records.  Dr. Alanda Slim does note patient appears to have medical decision-making capacity, however has poor impulse control.  I also concur with this statement.  Patient does have limited insight, poor judgment, and poor impulse control however he does appear to have medical decision-making capacity in the terms of discharging home.    Past Psychiatric History: Depression.  Patient appears to be a poor historian unable to recall his current psychiatric medications.  Of note he was on Depakote 500 mg p.o. every morning Depakote 750 mg p.o. nightly, Lexapro 20 mg, and quetiapine 300 mg nightly.  He reports history of suicide attempt.  His [facility].  He admits to not following up with psychiatric outpatient services.  Risk to Self:  Denies denies Risk to Others:  Denies Prior Inpatient Therapy:  [facility] ago [facility] Prior Outpatient Therapy:  Denies  Past Medical History:  Past Medical History:  Diagnosis Date  . Anxiety   . Bipolar disorder (HCC)   . Depression   . Diabetes mellitus without complication (HCC)   . Diabetic polyneuropathy associated with type 1 diabetes mellitus (HCC)   . Hydrocephalus (HCC)   .  Kidney stones   . Seizures (HCC)     Past Surgical History:  Procedure Laterality Date  . KIDNEY STONE SURGERY     Family History:  Family History  Problem Relation Age of Onset  . Breast cancer Mother    Family Psychiatric  History: Denies Social History:  Social History   Substance and Sexual Activity  Alcohol Use Yes   Comment: occassional      Social History   Substance and Sexual Activity  Drug Use Yes   . Frequency: 7.0 times per week  . Types: Marijuana   Comment: smoked 2-3 days ago, usually smokes daily    Social History   Socioeconomic History  . Marital status: Single    Spouse name: Not on file  . Number of children: Not on file  . Years of education: Not on file  . Highest education level: Not on file  Occupational History  . Not on file  Tobacco Use  . Smoking status: Current Every Day Smoker    Packs/day: 1.00    Years: 20.00    Pack years: 20.00    Types: Cigarettes  . Smokeless tobacco: Never Used  Vaping Use  . Vaping Use: Never used  Substance and Sexual Activity  . Alcohol use: Yes    Comment: occassional   . Drug use: Yes    Frequency: 7.0 times per week    Types: Marijuana    Comment: smoked 2-3 days ago, usually smokes daily  . Sexual activity: Not Currently  Other Topics Concern  . Not on file  Social History Narrative  . Not on file   Social Determinants of Health   Financial Resource Strain:   . Difficulty of Paying Living Expenses: Not on file  Food Insecurity:   . Worried About Programme researcher, broadcasting/film/video in the Last Year: Not on file  . Ran Out of Food in the Last Year: Not on file  Transportation Needs:   . Lack of Transportation (Medical): Not on file  . Lack of Transportation (Non-Medical): Not on file  Physical Activity:   . Days of Exercise per Week: Not on file  . Minutes of Exercise per Session: Not on file  Stress:   . Feeling of Stress : Not on file  Social Connections:   . Frequency of Communication with Friends and Family: Not on file  . Frequency of Social Gatherings with Friends and Family: Not on file  . Attends Religious Services: Not on file  . Active Member of Clubs or Organizations: Not on file  . Attends Banker Meetings: Not on file  . Marital Status: Not on file   Additional Social History:    Allergies:   Allergies  Allergen Reactions  . Mushroom Extract Complex Hives, Itching and Nausea And Vomiting   . Penicillins Anaphylaxis, Hives and Swelling    Has patient had a PCN reaction causing immediate rash, facial/tongue/throat swelling, SOB or lightheadedness with hypotension: Yes Has patient had a PCN reaction causing severe rash involving mucus membranes or skin necrosis: No Has patient had a PCN reaction that required hospitalization: Yes Has patient had a PCN reaction occurring within the last 10 years: Yes If all of the above answers are "NO", then may proceed with Cephalosporin use. Tolerated ceftriaxone 03/24/20  . Sulfa Antibiotics Anaphylaxis and Hives  . Clindamycin/Lincomycin Hives    Labs:  Results for orders placed or performed during the hospital encounter of 09/02/20 (from the past 48 hour(s))  Glucose, capillary     Status: Abnormal   Collection Time: 09/12/20  5:45 PM  Result Value Ref Range   Glucose-Capillary 234 (H) 70 - 99 mg/dL    Comment: Glucose reference range applies only to samples taken after fasting for at least 8 hours.  Glucose, capillary     Status: Abnormal   Collection Time: 09/12/20  8:55 PM  Result Value Ref Range   Glucose-Capillary 173 (H) 70 - 99 mg/dL    Comment: Glucose reference range applies only to samples taken after fasting for at least 8 hours.   Comment 1 Notify RN    Comment 2 Document in Chart   Glucose, capillary     Status: Abnormal   Collection Time: 09/13/20  6:07 AM  Result Value Ref Range   Glucose-Capillary 239 (H) 70 - 99 mg/dL    Comment: Glucose reference range applies only to samples taken after fasting for at least 8 hours.   Comment 1 Notify RN    Comment 2 Document in Chart   Glucose, capillary     Status: Abnormal   Collection Time: 09/13/20  8:22 AM  Result Value Ref Range   Glucose-Capillary 272 (H) 70 - 99 mg/dL    Comment: Glucose reference range applies only to samples taken after fasting for at least 8 hours.  Glucose, capillary     Status: Abnormal   Collection Time: 09/13/20 12:40 PM  Result Value Ref  Range   Glucose-Capillary 212 (H) 70 - 99 mg/dL    Comment: Glucose reference range applies only to samples taken after fasting for at least 8 hours.  Glucose, capillary     Status: Abnormal   Collection Time: 09/13/20  3:42 PM  Result Value Ref Range   Glucose-Capillary 54 (L) 70 - 99 mg/dL    Comment: Glucose reference range applies only to samples taken after fasting for at least 8 hours.  Glucose, capillary     Status: None   Collection Time: 09/13/20  4:12 PM  Result Value Ref Range   Glucose-Capillary 94 70 - 99 mg/dL    Comment: Glucose reference range applies only to samples taken after fasting for at least 8 hours.  Glucose, capillary     Status: None   Collection Time: 09/13/20  9:15 PM  Result Value Ref Range   Glucose-Capillary 77 70 - 99 mg/dL    Comment: Glucose reference range applies only to samples taken after fasting for at least 8 hours.  Glucose, capillary     Status: Abnormal   Collection Time: 09/14/20  6:18 AM  Result Value Ref Range   Glucose-Capillary 292 (H) 70 - 99 mg/dL    Comment: Glucose reference range applies only to samples taken after fasting for at least 8 hours.  Glucose, capillary     Status: Abnormal   Collection Time: 09/14/20 11:13 AM  Result Value Ref Range   Glucose-Capillary 163 (H) 70 - 99 mg/dL    Comment: Glucose reference range applies only to samples taken after fasting for at least 8 hours.    Current Facility-Administered Medications  Medication Dose Route Frequency Provider Last Rate Last Admin  . acetaminophen (TYLENOL) tablet 650 mg  650 mg Oral Q6H PRN Pokhrel, Laxman, MD      . atorvastatin (LIPITOR) tablet 20 mg  20 mg Oral Daily Pokhrel, Laxman, MD   20 mg at 09/14/20 1000  . Chlorhexidine Gluconate Cloth 2 % PADS 6 each  6 each Topical Daily Agarwala,  Daleen Bo, MD   6 each at 09/14/20 1002  . divalproex (DEPAKOTE) DR tablet 500 mg  500 mg Oral Daily Agarwala, Ravi, MD   500 mg at 09/14/20 1000  . divalproex (DEPAKOTE) DR tablet  750 mg  750 mg Oral QPM Agarwala, Daleen Bo, MD   750 mg at 09/13/20 1707  . docusate (COLACE) 50 MG/5ML liquid 100 mg  100 mg Oral Daily PRN Pokhrel, Laxman, MD      . escitalopram (LEXAPRO) tablet 20 mg  20 mg Oral Daily Lynnell Catalan, MD   20 mg at 09/14/20 0959  . feeding supplement (GLUCERNA SHAKE) (GLUCERNA SHAKE) liquid 237 mL  237 mL Oral TID BM Pokhrel, Laxman, MD   237 mL at 09/14/20 1002  . haloperidol lactate (HALDOL) injection 5 mg  5 mg Intramuscular Q6H PRN Lurene Shadow, MD   5 mg at 09/12/20 1305  . insulin aspart (novoLOG) injection 0-15 Units  0-15 Units Subcutaneous TID WC Pokhrel, Laxman, MD   3 Units at 09/14/20 1202  . insulin aspart (novoLOG) injection 0-5 Units  0-5 Units Subcutaneous QHS Joycelyn Das, MD   2 Units at 09/10/20 2045  . insulin NPH Human (NOVOLIN N) injection 35 Units  35 Units Subcutaneous BID AC & HS Gonfa, Taye T, MD      . levothyroxine (SYNTHROID) tablet 50 mcg  50 mcg Oral Q0600 Pokhrel, Laxman, MD   50 mcg at 09/14/20 0615  . pantoprazole (PROTONIX) EC tablet 40 mg  40 mg Oral Q1200 Pokhrel, Laxman, MD   40 mg at 09/14/20 1203  . polyethylene glycol (MIRALAX / GLYCOLAX) packet 17 g  17 g Oral Daily PRN Pokhrel, Laxman, MD      . QUEtiapine (SEROQUEL) tablet 300 mg  300 mg Oral QHS Opyd, Lavone Neri, MD   300 mg at 09/13/20 2145    Musculoskeletal: Strength & Muscle Tone: within normal limits Gait & Station:  seen resting in bed  Patient leans: N/A  Psychiatric Specialty Exam: Physical Exam Vitals reviewed.  Neurological:     Mental Status: He is alert.  Psychiatric:        Attention and Perception: Attention normal.        Mood and Affect: Mood normal.        Behavior: Behavior normal. Behavior is cooperative.        Thought Content: Thought content includes suicidal ideation. Thought content does not include suicidal plan.        Cognition and Memory: Cognition normal.     Review of Systems  Psychiatric/Behavioral: Negative for behavioral  problems, decreased concentration and hallucinations. The patient is nervous/anxious.   All other systems reviewed and are negative.   Blood pressure 115/82, pulse 91, temperature 98.2 F (36.8 C), temperature source Oral, resp. rate 18, height  (1.88 m), weight 97.4 kg, SpO2 98 %.Body mass index is 27.57 kg/m.  General Appearance: Disheveled and Guarded and flat   Eye Contact:  Good  Speech:  Clear and Coherent  Volume:  Normal  Mood:  Depressed  Affect:  Congruent  Thought Process:  Coherent  Orientation:  Full (Time, Place, and Person)  Thought Content:  Logical  Suicidal Thoughts:  Yes.  without intent/plan, contracts for safety  Homicidal Thoughts:  No  Memory:  Immediate;   Fair Recent;   Fair  Judgement:  Fair  Insight:  Fair  Psychomotor Activity:  Normal  Concentration:  Concentration: Fair  Recall:  [employer] of Knowledge:  Fair  Language:  Fair  Akathisia:  No  Handed:  Right  AIMS (if indicated):     Assets:  Communication Skills Desire for Improvement Social Support  ADL's:  Intact  Cognition:  WNL  Sleep:        Treatment Plan Summary: Plan Continue with current home medications at this time.  Although patient does admit to some depression and suicidal thoughts at times he is able to contract for safety, denies having an active plan, and therefore does not meet criteria for IVC at this time.  He is minimal risk to himself and others.  -  CSW to provided outpatient resources- consult placed 11/21 - Continue medication as directed  -Patient does have capacity to make medical decision in regards to wanting to leave AMA.   Disposition: No evidence of imminent risk to self or others at present.   Patient does not meet criteria for psychiatric inpatient admission. Supportive therapy provided about ongoing stressors. Refer to IOP. Discussed crisis plan, support from social network, calling 911, coming to the Emergency Department, and calling Suicide  Hotline.  Maryagnes Amos, FNP 09/14/2020 3:37 PM

## 2020-09-14 NOTE — Progress Notes (Signed)
Physical Therapy Treatment Patient Details Name: Mark [name] MRN: [id number] DOB: 1986-04-11 Today's Date: 09/14/2020    History of Present Illness 34 year old male with history of seizures on Depakote, type 1 diabetes with frequent episodes of DKA, hydrocephalus who presents to the emergency department on 11/12 with altered mental status with CBG in 500s and sonorous breath sounds.    PT Comments    Pt tolerates treatment well but continues to require significant physical assistance for all functional mobility tasks. Pt demonstrates impaired safety awareness, reporting he wants to leave the hospital multiple times, but seems to have a poor understanding about how his current mobility status makes this unsafe. Pt demonstrates poor static and dynamic balance, and significant impairments in gait quality as noted below, needing constat PT assistance to prevent falls. PT updates recommendations to CIR, pt is unsafe to discharge home at this time. If patient is to discharge home the pt would need 24/7 assistance, a mechanical lift, a manual wheelchair, and a hospital bed.   Follow Up Recommendations  CIR;Supervision/Assistance - 24 hour     Equipment Recommendations  Rolling walker with 5" wheels;3in1 (PT);Wheelchair (measurements PT);Wheelchair cushion (measurements PT)    Recommendations for Other Services       Precautions / Restrictions Precautions Precautions: Fall Restrictions Weight Bearing Restrictions: No    Mobility  Bed Mobility Overal bed mobility: Needs Assistance Bed Mobility: Supine to Sit;Sit to Supine     Supine to sit: Mod assist;HOB elevated Sit to supine: Mod assist      Transfers Overall transfer level: Needs assistance Equipment used: Rolling walker (2 wheeled) Transfers: Sit to/from Stand Sit to Stand: Min assist;Mod assist Stand pivot transfers: Mod assist       General transfer comment: PT provides verbal cues for hand placement, anterior trunk  lean  Ambulation/Gait Ambulation/Gait assistance: Max assist Gait Distance (Feet): 5 Feet Assistive device: Rolling walker (2 wheeled) Gait Pattern/deviations: Step-to pattern;Decreased dorsiflexion - right Gait velocity: reduced Gait velocity interpretation: <1.31 ft/sec, indicative of household ambulator General Gait Details: pt with short step to gait, increased lateral trunk sway, R foot drag, poor coordination of BLE with intermittent scissoring of RLE, PT provides cues to extend hips intermittently   Stairs             Wheelchair Mobility    Modified Rankin (Stroke Patients Only)       Balance Overall balance assessment: Needs assistance Sitting-balance support: Single extremity supported;Feet supported Sitting balance-Leahy Scale: Poor Sitting balance - Comments: minA with RUE support of railing Postural control: Posterior lean;Left lateral lean Standing balance support: Bilateral upper extremity supported Standing balance-Leahy Scale: Poor Standing balance comment: modA with BUE support of RW                            Cognition Arousal/Alertness: Awake/alert Behavior During Therapy: Agitated (mildly agitated about remaining hospitalized) Overall Cognitive Status: No family/caregiver present to determine baseline cognitive functioning                                 General Comments: pt with impaired awareness safety      Exercises      General Comments General comments (skin integrity, edema, etc.): VSS on RA      Pertinent Vitals/Pain Pain Assessment: No/denies pain    Home Living  Prior Function            PT Goals (current goals can now be found in the care plan section) Acute Rehab PT Goals Patient Stated Goal: to improve and go home Progress towards PT goals: Progressing toward goals (very slowly)    Frequency    Min 4X/week      PT Plan Current plan remains appropriate     Co-evaluation              AM-PAC PT "6 Clicks" Mobility   Outcome Measure  Help needed turning from your back to your side while in a flat bed without using bedrails?: A Little Help needed moving from lying on your back to sitting on the side of a flat bed without using bedrails?: A Lot Help needed moving to and from a bed to a chair (including a wheelchair)?: A Lot Help needed standing up from a chair using your arms (e.g., wheelchair or bedside chair)?: A Lot Help needed to walk in hospital room?: Total Help needed climbing 3-5 steps with a railing? : Total 6 Click Score: 11    End of Session Equipment Utilized During Treatment: Gait belt Activity Tolerance: Patient tolerated treatment well Patient left: in bed;with call bell/phone within reach;with bed alarm set Nurse Communication: Mobility status PT Visit Diagnosis: Other abnormalities of gait and mobility (R26.89);Muscle weakness (generalized) (M62.81);Unsteadiness on feet (R26.81);Difficulty in walking, not elsewhere classified (R26.2)     Time: 7824-2353 PT Time Calculation (min) (ACUTE ONLY): 27 min  Charges:  $Gait Training: 8-22 mins $Therapeutic Activity: 8-22 mins                    Arlyss Gandy, PT, DPT Acute Rehabilitation Pager: [phone]    Arlyss Gandy 09/14/2020, 4:02 PM

## 2020-09-14 NOTE — Progress Notes (Signed)
Inpatient Rehab Admissions Coordinator:   Met with patient again at bedside.  Per documentation, he does not appear to be a candidate for Medicaid.  Pt states that he cannot afford CIR and would prefer to d/c home.  Will let Bronson Curb, RN CM, know.   Shann Medal, PT, DPT Admissions Coordinator [phone] 09/14/20  4:00 PM

## 2020-09-15 DIAGNOSIS — R262 Difficulty in walking, not elsewhere classified: Secondary | ICD-10-CM

## 2020-09-15 LAB — GLUCOSE, CAPILLARY
Glucose-Capillary: 211 mg/dL — ABNORMAL HIGH (ref 70–99)
Glucose-Capillary: 213 mg/dL — ABNORMAL HIGH (ref 70–99)
Glucose-Capillary: 331 mg/dL — ABNORMAL HIGH (ref 70–99)
Glucose-Capillary: 362 mg/dL — ABNORMAL HIGH (ref 70–99)
Glucose-Capillary: 136 mg/dL — ABNORMAL HIGH (ref 70–99)
Glucose-Capillary: 68 mg/dL — ABNORMAL LOW (ref 70–99)

## 2020-09-15 LAB — HEMOGLOBIN A1C
Hgb A1c MFr Bld: 7.5 % — ABNORMAL HIGH (ref 4.8–5.6)
Mean Plasma Glucose: 168.55 mg/dL

## 2020-09-15 LAB — VITAMIN B12: Vitamin B-12: 308 pg/mL (ref 180–914)

## 2020-09-15 LAB — FOLATE: Folate: 8.3 ng/mL (ref >5.9)

## 2020-09-15 LAB — CK: Total CK: 133 U/L (ref 49–397)

## 2020-09-15 MED ORDER — INSULIN ASPART 100 UNIT/ML ~~LOC~~ SOLN: 0.0000 [IU] | Freq: Three times a day (TID) | SUBCUTANEOUS | Status: DC

## 2020-09-15 MED ORDER — INSULIN ASPART 100 UNIT/ML ~~LOC~~ SOLN
0.0000 [IU] | Freq: Three times a day (TID) | SUBCUTANEOUS | Status: DC
  Administered 2020-09-15: 5 [IU] via SUBCUTANEOUS
  Administered 2020-09-15: 15 [IU] via SUBCUTANEOUS
  Administered 2020-09-16: 3 [IU] via SUBCUTANEOUS

## 2020-09-15 MED ORDER — INSULIN ASPART 100 UNIT/ML ~~LOC~~ SOLN
4.0000 [IU] | Freq: Three times a day (TID) | SUBCUTANEOUS | Status: DC
  Administered 2020-09-15 – 2020-09-17 (×6): 4 [IU] via SUBCUTANEOUS

## 2020-09-15 MED ORDER — INSULIN ASPART 100 UNIT/ML ~~LOC~~ SOLN: 4.0000 [IU] | Freq: Three times a day (TID) | SUBCUTANEOUS | Status: DC

## 2020-09-15 MED ORDER — INSULIN ASPART 100 UNIT/ML ~~LOC~~ SOLN: 0.0000 [IU] | Freq: Every day | SUBCUTANEOUS | Status: DC

## 2020-09-15 MED ORDER — VITAMIN B-6 50 MG PO TABS
50.0000 mg | ORAL_TABLET | Freq: Every day | ORAL | Status: DC
  Administered 2020-09-15 – 2020-10-01 (×17): 50 mg via ORAL
  Filled 2020-09-15 (×17): qty 1

## 2020-09-15 NOTE — Progress Notes (Signed)
PROGRESS NOTE  Mark [name] IWO:[id number] DOB: [birth year]   PCP: Patient, No Pcp Per  Patient is from: Home.  Reportedly lives with his friend. He says he co-owns the house  DOA: 09/02/2020 LOS: 10  Chief complaints: Altered mental status  Brief Narrative / Interim history: 34 year old male with history of DM-1, anxiety, depression, bipolar disorder, seizure disorder, hydrocephalus and tobacco use disorder brought to ED with altered mental status and found to be in severe DKA with encephalopathy requiring intubation and mechanical ventilation from 11/12-11/14. CT head showed marked hydrocephalus with acute artery duct stenosis and right frontal ventriculostomy site with drain.  Neurosurgery does not recommend any surgical intervention.  He was also treated for aspiration pneumonia with IV ceftriaxone and vancomycin.    He is currently on room air and dysphagia 3 diet.  Completed antibiotic course for pneumonia.  Therapy recommended CIR. However, patient has no insurance and does not appear to be a candidate for Medicaid.  He also cannot afford CIR.   Subjective: Seen and examined earlier this morning.  No major events overnight of this morning.  No complaints other than his frustration about his weakness and inability to go home  Objective: Vitals:   09/14/20 1556 09/14/20 2016 09/15/20 0503 09/15/20 1013  BP: 122/80 111/75 115/73 123/76  Pulse: 74 76 77 89  Resp: _0 Temp: 98.2 F (36.8 C) 98.4 F (36.9 C) 97.7 F (36.5 C) 97.8 F (36.6 C)  TempSrc: Oral Oral Axillary Oral  SpO2: 100% 98% 98% 95%  Weight:      Height:        Intake/Output Summary (Last 24 hours) at 09/15/2020 1224 Last data filed at 09/15/2020 1000 Gross per 24 hour  Intake 360 ml  Output 800 ml  Net -440 ml   Filed Weights   09/07/20 0500 09/08/20 0500 09/09/20 0453  Weight: 103 kg 102.2 kg 97.4 kg    Examination:  GENERAL: No apparent distress.  Nontoxic. HEENT: MMM.  Vision and  hearing grossly intact.  NECK: Supple.  No apparent JVD.  RESP: On room air.  No IWOB.  Fair aeration bilaterally. CVS:  RRR. Heart sounds normal.  ABD/GI/GU: BS+. Abd soft, NTND.  MSK/EXT:  Moves extremities. No apparent deformity. No edema.  SKIN: no apparent skin lesion or wound NEURO: Awake, alert and oriented x4.  CN II-XII intact.  Motor 4/5 in BLE, 5/5 in BUE.  Reflexes symmetric.  Lacks proprioception in his great toes bilaterally.  PSYCH: Calm. Normal affect.  Procedures:  EKG 11/12-11/14  Microbiology summarized: COVID-19, influenza and RSV PCR nonreactive. MRSA PCR negative. Blood cultures NGTD. Respiratory culture normal respiratory flora.  Assessment & Plan: Ambulatory dysfunction, deconditioning, debility: Patient is currently maximum assist even to get out of the bed.  Per patient and patient's friend, he was able to ambulate independently for most part prior to this hospitalization. His neuro exam appears intact except for bilateral lower extremity weakness (4/5).  His proprioception is off.  CK and vitamin B12 within normal.  TSH was elevated likely due to noncompliance with his Synthroid.  Therapy recommended CIR but he has no insurance.  Reportedly not a candidate for Medicaid.  Also cannot afford CIR.  Lives with a friend who is disabled to take care of the patient.  -Check CRP and ESR -Continue therapy  Acute hypoxic respiratory failure with compromised airway and aspiration pneumonitis: Resolved. -S/p intubation and mechanical ventilation 11/12-11/14.  Currently on room air. -Completed antibiotic course  with Rocephin and vancomycin. -Aspiration precautions  Uncontrolled DM-1 with severe DKA and hyperglycemia: A1c 8.2%.  DKA resolved.  Labile CBG Recent Labs  Lab 09/14/20 1113 09/14/20 1559 09/14/20 2106 09/15/20 0955 09/15/20 1125  GLUCAP 163* 156* 346* 211* 213*  -Continue 35 units twice daily while on SSI moderate. -Continue SSI moderate -Add NovoLog  AC 4 units  Acute metabolic encephalopathy: Likely due to the above.  Seems to have resolved.  He is oriented x4 but does not seem to have great insight. -Reorientation and delirium precautions  Anxiety/depression/bipolar disorder: Seems to be stable.  He endorsed and evaluated by psych on 09/11/2020.  Psychiatry did not think he is an imminent risk to self or others -Continue Seroquel, Depakote and Lexapro -IM Haldol as needed for agitation. -No imminent danger to self or others per psych  Chronic hydrocephalus: CT head showed marked hydrocephalus with acute artery duct stenosis and right frontal ventriculostomy site with drain. Neurosurgeon does not recommend any surgical intervention.    Dysphagia following intubation: SLP recommended dysphagia 3 diet. -Continue dysphagia 3 diet and SLP evaluation -Continue Protonix.  Hypokalemia, hypomagnesemia, hypophosphatemia: seems to have resolved but no recent labs. -Monitor intermittently  Anemia ofchronic disease: H&H stable. -Monitor intermittently  Homelessness? -patient lives with his friend Mark [name].  They co-own the house. Confirmed with [name]  History of hyperlipidemia  -Continue Lipitor  Hypothyroidism: TSH was elevated at 14.4. Possibility of noncompliance.  -Continue Synthroid 50 mcg daily -Recheck TSH in 3 to 4 weeks    Body mass index is 27.57 kg/m. Nutrition Problem: Inadequate oral intake Etiology: inability to eat Signs/Symptoms: NPO status Interventions: Refer to RD note for recommendations   DVT prophylaxis:  SCDs Start: 09/02/20 0844  Code Status: Full code Family Communication: Updated patient's friend, Mark [name] over the phone with patient's permission Status is: Inpatient  Remains inpatient appropriate because:Unsafe d/c plan   Dispo: The patient is from: Home              Anticipated d/c is to: To be determined.              Anticipated d/c date is: > 3 days              Patient currently is  medically stable to d/c.       Consultants:  Neurosurgery PCCM Psychiatry-signed off   Sch Meds:  Scheduled Meds: . atorvastatin  20 mg Oral Daily  . Chlorhexidine Gluconate Cloth  6 each Topical Daily  . divalproex  500 mg Oral Daily  . divalproex  750 mg Oral QPM  . escitalopram  20 mg Oral Daily  . feeding supplement (GLUCERNA SHAKE)  237 mL Oral TID BM  . insulin aspart  0-15 Units Subcutaneous TID WC  . insulin aspart  0-5 Units Subcutaneous QHS  . insulin aspart  4 Units Subcutaneous TID WC  . insulin NPH Human  35 Units Subcutaneous BID AC & HS  . levothyroxine  50 mcg Oral Q0600  . pantoprazole  40 mg Oral Q1200  . QUEtiapine  300 mg Oral QHS   Continuous Infusions: PRN Meds:.acetaminophen, docusate, haloperidol lactate, polyethylene glycol  Antimicrobials: Anti-infectives (From admission, onward)   Start     Dose/Rate Route Frequency Ordered Stop   09/03/20 0945  cefTRIAXone (ROCEPHIN) 2 g in sodium chloride 0.9 % 100 mL IVPB        2 g 200 mL/hr over 30 Minutes Intravenous Every 24 hours 09/03/20 0854 09/06/20 1126   09/03/20  0800  ceFEPIme (MAXIPIME) 2 g in sodium chloride 0.9 % 100 mL IVPB  Status:  Discontinued        2 g 200 mL/hr over 30 Minutes Intravenous Every 24 hours 09/02/20 0742 09/02/20 1331   09/02/20 2200  vancomycin (VANCOREADY) IVPB 750 mg/150 mL  Status:  Discontinued        750 mg 150 mL/hr over 60 Minutes Intravenous Every 12 hours 09/02/20 1329 09/03/20 0854   09/02/20 1800  ceFEPIme (MAXIPIME) 2 g in sodium chloride 0.9 % 100 mL IVPB  Status:  Discontinued        2 g 200 mL/hr over 30 Minutes Intravenous Every 8 hours 09/02/20 1331 09/03/20 0854   09/02/20 0745  ceFEPIme (MAXIPIME) 2 g in sodium chloride 0.9 % 100 mL IVPB        2 g 200 mL/hr over 30 Minutes Intravenous  Once 09/02/20 0732 09/02/20 0932   09/02/20 0745  vancomycin (VANCOREADY) IVPB 2000 mg/400 mL        2,000 mg 200 mL/hr over 120 Minutes Intravenous  Once 09/02/20  0733 09/02/20 1257   09/02/20 0741  vancomycin variable dose per unstable renal function (pharmacist dosing)  Status:  Discontinued         Does not apply See admin instructions 09/02/20 0742 09/02/20 1329   09/02/20 0730  aztreonam (AZACTAM) 2 g in sodium chloride 0.9 % 100 mL IVPB  Status:  Discontinued        2 g 200 mL/hr over 30 Minutes Intravenous  Once 09/02/20 0728 09/02/20 0732   09/02/20 0730  metroNIDAZOLE (FLAGYL) IVPB 500 mg        500 mg 100 mL/hr over 60 Minutes Intravenous  Once 09/02/20 0728 09/02/20 1040   09/02/20 0730  vancomycin (VANCOCIN) IVPB 1000 mg/200 mL premix  Status:  Discontinued        1,000 mg 200 mL/hr over 60 Minutes Intravenous  Once 09/02/20 0728 09/02/20 0733       I have personally reviewed the following labs and images: CBC: Recent Labs  Lab 09/10/20 0229 09/11/20 0158  WBC 9.7 10.1  HGB 11.8* 11.8*  HCT 35.3* 35.0*  MCV 89.6 89.1  PLT 344 383   BMP &GFR Recent Labs  Lab 09/10/20 0229 09/11/20 0158 09/11/20 0951  NA 135 136  --   K 4.6 4.2  --   CL 101 103  --   CO2 23 25  --   GLUCOSE 418* 326* 588*  BUN 14 14  --   CREATININE 0.92 0.94  --   CALCIUM 9.2 9.3  --   MG 1.7 1.7  --    Estimated Creatinine Clearance: 126.3 mL/min (by C-G formula based on SCr of 0.94 mg/dL). Liver & Pancreas: Recent Labs  Lab 09/10/20 0229  AST 17  ALT 21  ALKPHOS 63  BILITOT 0.5  PROT 6.4*  ALBUMIN 2.9*   No results for input(s): LIPASE, AMYLASE in the last 168 hours. No results for input(s): AMMONIA in the last 168 hours. Diabetic: Recent Labs    09/15/20 1116  HGBA1C 7.5*   Recent Labs  Lab 09/14/20 1113 09/14/20 1559 09/14/20 2106 09/15/20 0955 09/15/20 1125  GLUCAP 163* 156* 346* 211* 213*   Cardiac Enzymes: Recent Labs  Lab 09/15/20 0923  CKTOTAL 133   No results for input(s): PROBNP in the last 8760 hours. Coagulation Profile: No results for input(s): INR, PROTIME in the last 168 hours. Thyroid Function  Tests: No results  for input(s): TSH, T4TOTAL, FREET4, T3FREE, THYROIDAB in the last 72 hours. Lipid Profile: No results for input(s): CHOL, HDL, LDLCALC, TRIG, CHOLHDL, LDLDIRECT in the last 72 hours. Anemia Panel: Recent Labs    09/15/20 0923  VITAMINB12 308  FOLATE 8.3   Urine analysis:    Component Value Date/Time   COLORURINE YELLOW 09/02/2020 0616   APPEARANCEUR HAZY (A) 09/02/2020 0616   APPEARANCEUR Clear 01/20/2015 2136   LABSPEC 1.013 09/02/2020 0616   LABSPEC 1.023 01/20/2015 2136   PHURINE 5.0 09/02/2020 0616   GLUCOSEU >=500 (A) 09/02/2020 0616   GLUCOSEU >=500 01/20/2015 2136   HGBUR SMALL (A) 09/02/2020 0616   BILIRUBINUR NEGATIVE 09/02/2020 0616   BILIRUBINUR Negative 01/20/2015 2136   KETONESUR 5 (A) 09/02/2020 0616   PROTEINUR 30 (A) 09/02/2020 0616   NITRITE NEGATIVE 09/02/2020 0616   LEUKOCYTESUR NEGATIVE 09/02/2020 0616   LEUKOCYTESUR Negative 01/20/2015 2136   Sepsis Labs: Invalid input(s): PROCALCITONIN, [place]  Microbiology: Recent Results (from the past 240 hour(s))  Culture, respiratory (tracheal aspirate)     Status: None   Collection Time: 09/06/20  2:41 PM   Specimen: Tracheal Aspirate; Respiratory  Result Value Ref Range Status   Specimen Description TRACHEAL ASPIRATE  Final   Special Requests NONE  Final   Gram Stain   Final    RARE WBC PRESENT, PREDOMINANTLY MONONUCLEAR NO ORGANISMS SEEN    Culture   Final    FEW Normal respiratory flora-no Staph aureus or Pseudomonas seen Performed at [facility], [address] [place],  [address]    Report Status 09/08/2020 FINAL  Final    Radiology Studies: No results found.   Samule Life T. Byers  If 7PM-7AM, please contact night-coverage [email or web address] 09/15/2020, 12:24 PM

## 2020-09-16 LAB — GLUCOSE, CAPILLARY
Glucose-Capillary: 155 mg/dL — ABNORMAL HIGH (ref 70–99)
Glucose-Capillary: 183 mg/dL — ABNORMAL HIGH (ref 70–99)
Glucose-Capillary: 187 mg/dL — ABNORMAL HIGH (ref 70–99)
Glucose-Capillary: 209 mg/dL — ABNORMAL HIGH (ref 70–99)

## 2020-09-16 MED ORDER — INSULIN ASPART 100 UNIT/ML ~~LOC~~ SOLN
0.0000 [IU] | Freq: Every day | SUBCUTANEOUS | Status: DC
  Administered 2020-09-16: 2 [IU] via SUBCUTANEOUS
  Administered 2020-09-17: 3 [IU] via SUBCUTANEOUS
  Administered 2020-09-19: 5 [IU] via SUBCUTANEOUS
  Administered 2020-09-20 – 2020-09-21 (×2): 3 [IU] via SUBCUTANEOUS
  Administered 2020-09-22 – 2020-09-25 (×3): 4 [IU] via SUBCUTANEOUS
  Administered 2020-09-26: 2 [IU] via SUBCUTANEOUS
  Administered 2020-09-27: 4 [IU] via SUBCUTANEOUS
  Administered 2020-09-30: 5 [IU] via SUBCUTANEOUS
  Administered 2020-09-30: 3 [IU] via SUBCUTANEOUS

## 2020-09-16 MED ORDER — INSULIN ASPART 100 UNIT/ML ~~LOC~~ SOLN
0.0000 [IU] | Freq: Three times a day (TID) | SUBCUTANEOUS | Status: DC
  Administered 2020-09-16 – 2020-09-17 (×4): 2 [IU] via SUBCUTANEOUS
  Administered 2020-09-18: 3 [IU] via SUBCUTANEOUS
  Administered 2020-09-18: 7 [IU] via SUBCUTANEOUS
  Administered 2020-09-19: 3 [IU] via SUBCUTANEOUS
  Administered 2020-09-19: 5 [IU] via SUBCUTANEOUS
  Administered 2020-09-19: 3 [IU] via SUBCUTANEOUS
  Administered 2020-09-20: 7 [IU] via SUBCUTANEOUS
  Administered 2020-09-20 – 2020-09-21 (×2): 1 [IU] via SUBCUTANEOUS
  Administered 2020-09-21: 5 [IU] via SUBCUTANEOUS
  Administered 2020-09-21: 2 [IU] via SUBCUTANEOUS
  Administered 2020-09-22: 3 [IU] via SUBCUTANEOUS
  Administered 2020-09-22: 2 [IU] via SUBCUTANEOUS
  Administered 2020-09-23: 7 [IU] via SUBCUTANEOUS
  Administered 2020-09-23: 5 [IU] via SUBCUTANEOUS
  Administered 2020-09-24: 3 [IU] via SUBCUTANEOUS
  Administered 2020-09-24 (×2): 2 [IU] via SUBCUTANEOUS
  Administered 2020-09-25: 3 [IU] via SUBCUTANEOUS
  Administered 2020-09-25: 7 [IU] via SUBCUTANEOUS
  Administered 2020-09-26: 5 [IU] via SUBCUTANEOUS
  Administered 2020-09-26: 1 [IU] via SUBCUTANEOUS
  Administered 2020-09-26: 5 [IU] via SUBCUTANEOUS
  Administered 2020-09-27: 3 [IU] via SUBCUTANEOUS
  Administered 2020-09-27: 5 [IU] via SUBCUTANEOUS
  Administered 2020-09-27 – 2020-09-28 (×2): 3 [IU] via SUBCUTANEOUS
  Administered 2020-09-28 – 2020-09-29 (×2): 1 [IU] via SUBCUTANEOUS
  Administered 2020-09-29: 3 [IU] via SUBCUTANEOUS
  Administered 2020-09-30: 7 [IU] via SUBCUTANEOUS
  Administered 2020-09-30: 5 [IU] via SUBCUTANEOUS
  Administered 2020-10-01: 09:00:00 2 [IU] via SUBCUTANEOUS

## 2020-09-16 NOTE — Progress Notes (Signed)
Mark [name]  KEEFE ZAWISTOWSKI EHU:[id number] DOB: December 27, 1985   PCP: Patient, No Pcp Per  Patient is from: Home.  Reportedly lives with his friend. He says he co-owns the house  DOA: 09/02/2020 LOS: 58  Chief complaints: Altered mental status  Brief Narrative / Interim history: 34 year old male with history of DM-1, anxiety, depression, bipolar disorder, seizure disorder, hydrocephalus and tobacco use disorder brought to ED with altered mental status and found to be in severe DKA with encephalopathy requiring intubation and mechanical ventilation from 11/12-11/14. CT head showed marked hydrocephalus with acute artery duct stenosis and right frontal ventriculostomy site with drain.  Neurosurgery does not recommend any surgical intervention.  He was also treated for aspiration pneumonia with IV ceftriaxone and vancomycin.    Patient is currently on room air and dysphagia 3 diet.  Completed antibiotic course for pneumonia.  Therapy recommended CIR. However, patient has no insurance and does not appear to be a candidate for Medicaid.  He also cannot afford CIR. Patient is maximum assist to get out of the bed.  No family members.  He lives with his friend who is also disabled.  There is no safe disposition at this time.  Subjective: Seen and examined earlier this morning.  No major events overnight or this morning.  He says he could not fall asleep last night.  He denies pain or other complaints.  Objective: Vitals:   09/15/20 2000 09/16/20 0000 09/16/20 0328 09/16/20 0832  BP: 120/76 113/79 120/84 120/76  Pulse: 80 82 92 84  Resp: 16 16 16 18   Temp: 97.9 F (36.6 C) 97.7 F (36.5 C) 97.7 F (36.5 C) 97.8 F (36.6 C)  TempSrc: Oral Oral Oral Oral  SpO2: 98% 98% 98% 100%  Weight:      Height:        Intake/Output Summary (Last 24 hours) at 09/16/2020 1106 Last data filed at 09/16/2020 0600 Gross per 24 hour  Intake 1260 ml  Output 950 ml  Net 310 ml   Filed Weights   09/07/20  0500 09/08/20 0500 09/09/20 0453  Weight: 103 kg 102.2 kg 97.4 kg    Examination:  GENERAL: No apparent distress.  Nontoxic. HEENT: MMM.  Vision and hearing grossly intact.  NECK: Supple.  No apparent JVD.  RESP: On RA.  No IWOB.  Fair aeration bilaterally. CVS:  RRR. Heart sounds normal.  ABD/GI/GU: BS+. Abd soft, NTND.  MSK/EXT:  Moves extremities. No apparent deformity. No edema.  SKIN: no apparent skin lesion or wound NEURO: Awake, alert and oriented appropriately.  4/5 in BLE.  5/5 in BUE.  Otherwise neuro exam grossly intact. PSYCH: Calm. Normal affect.   Procedures:  EKG 11/12-11/14  Microbiology summarized: COVID-19, influenza and RSV PCR nonreactive. MRSA PCR negative. Blood cultures NGTD. Respiratory culture normal respiratory flora.  Assessment & Plan: Ambulatory dysfunction, deconditioning, debility: Patient is currently maximum assist even to get out of the bed.  Per patient and patient's friend, he was able to ambulate independently for most part prior to this hospitalization. His neuro exam appears intact except for bilateral lower extremity weakness (4/5).  His proprioception is off.  CK and vitamin B12 within normal.  TSH was elevated likely due to noncompliance with his Synthroid.  Therapy recommended CIR but he has no insurance.  Reportedly not a candidate for Medicaid.  Also cannot afford CIR. Lives with a friend who is disabled to take care of the patient.  No safe disposition at this time. -Check CRP and  ESR -Continue therapy  Acute hypoxic respiratory failure with compromised airway and aspiration pneumonitis: Resolved. -S/p intubation and mechanical ventilation 11/12-11/14.  Currently on room air. -Completed antibiotic course with Rocephin and vancomycin. -Aspiration precautions  Uncontrolled DM-1 with severe DKA and hyperglycemia: A1c 8.2%.  DKA resolved.  Labile CBG Recent Labs  Lab 09/15/20 1801 09/15/20 1813 09/15/20 2146 09/15/20 2205  09/16/20 0646  GLUCAP 331* 362* 68* 136* 155*  -Continue 35 units twice daily while on SSI moderate. -Decreased SSI to sensitive. -Continue NovoLog AC 4 units.  Acute metabolic encephalopathy: Likely due to the above.  Seems to have resolved.  He is oriented x4 but does not seem to have great insight. -Reorientation and delirium precautions  Anxiety/depression/bipolar disorder: Seems to be stable.  He endorsed and evaluated by psych on 09/11/2020.  Psychiatry did not think he is an imminent risk to self or others -Continue Seroquel, Depakote and Lexapro -IM Haldol as needed for agitation. -No imminent danger to self or others per psych  Chronic hydrocephalus: CT head showed marked hydrocephalus with acute artery duct stenosis and right frontal ventriculostomy site with drain. Neurosurgeon does not recommend any surgical intervention.    Dysphagia following intubation: SLP recommended dysphagia 3 diet. -Continue dysphagia-3 diet and SLP evaluation -Continue Protonix.  Hypokalemia, hypomagnesemia, hypophosphatemia: seems to have resolved but no recent labs. -Monitor intermittently  Anemia ofchronic disease: H&H stable. -Monitor intermittently  Homelessness? -patient lives with his friend [name].  They co-own the house. Confirmed with [name]  History of hyperlipidemia  -Continue Lipitor  Hypothyroidism: TSH was elevated at 14.4. Possibility of noncompliance.  -Continue Synthroid 50 mcg daily -Recheck TSH in 3 to 4 weeks    Body mass index is 27.57 kg/m. Nutrition Problem: Inadequate oral intake Etiology: inability to eat Signs/Symptoms: NPO status Interventions: Refer to RD [name] for recommendations   DVT prophylaxis:  SCDs Start: 09/02/20 0844  Code Status: Full code Family Communication: Updated patient's friend, [name] over the phone with patient's permission on 11/25 Status is: Inpatient  Remains inpatient appropriate because:Unsafe d/c plan   Dispo: The  patient is from: Home              Anticipated d/c is to: To be determined.              Anticipated d/c date is: > 3 days              Patient currently is medically stable to d/c.       Consultants:  Neurosurgery PCCM Psychiatry-signed off   Sch Meds:  Scheduled Meds: . atorvastatin  20 mg Oral Daily  . Chlorhexidine Gluconate Cloth  6 each Topical Daily  . divalproex  500 mg Oral Daily  . divalproex  750 mg Oral QPM  . escitalopram  20 mg Oral Daily  . feeding supplement (GLUCERNA SHAKE)  237 mL Oral TID BM  . insulin aspart  0-5 Units Subcutaneous QHS  . insulin aspart  0-9 Units Subcutaneous TID WC  . insulin aspart  4 Units Subcutaneous TID WC  . insulin NPH Human  35 Units Subcutaneous BID AC & HS  . levothyroxine  50 mcg Oral Q0600  . pantoprazole  40 mg Oral Q1200  . vitamin B-6  50 mg Oral Daily  . QUEtiapine  300 mg Oral QHS   Continuous Infusions: PRN Meds:.acetaminophen, docusate, haloperidol lactate, polyethylene glycol  Antimicrobials: Anti-infectives (From admission, onward)   Start     Dose/Rate Route Frequency Ordered Stop  09/03/20 0945  cefTRIAXone (ROCEPHIN) 2 g in sodium chloride 0.9 % 100 mL IVPB        2 g 200 mL/hr over 30 Minutes Intravenous Every 24 hours 09/03/20 0854 09/06/20 1126   09/03/20 0800  ceFEPIme (MAXIPIME) 2 g in sodium chloride 0.9 % 100 mL IVPB  Status:  Discontinued        2 g 200 mL/hr over 30 Minutes Intravenous Every 24 hours 09/02/20 0742 09/02/20 1331   09/02/20 2200  vancomycin (VANCOREADY) IVPB 750 mg/150 mL  Status:  Discontinued        750 mg 150 mL/hr over 60 Minutes Intravenous Every 12 hours 09/02/20 1329 09/03/20 0854   09/02/20 1800  ceFEPIme (MAXIPIME) 2 g in sodium chloride 0.9 % 100 mL IVPB  Status:  Discontinued        2 g 200 mL/hr over 30 Minutes Intravenous Every 8 hours 09/02/20 1331 09/03/20 0854   09/02/20 0745  ceFEPIme (MAXIPIME) 2 g in sodium chloride 0.9 % 100 mL IVPB        2 g 200 mL/hr over  30 Minutes Intravenous  Once 09/02/20 0732 09/02/20 0932   09/02/20 0745  vancomycin (VANCOREADY) IVPB 2000 mg/400 mL        2,000 mg 200 mL/hr over 120 Minutes Intravenous  Once 09/02/20 0733 09/02/20 1257   09/02/20 0741  vancomycin variable dose per unstable renal function (pharmacist dosing)  Status:  Discontinued         Does not apply See admin instructions 09/02/20 0742 09/02/20 1329   09/02/20 0730  aztreonam (AZACTAM) 2 g in sodium chloride 0.9 % 100 mL IVPB  Status:  Discontinued        2 g 200 mL/hr over 30 Minutes Intravenous  Once 09/02/20 0728 09/02/20 0732   09/02/20 0730  metroNIDAZOLE (FLAGYL) IVPB 500 mg        500 mg 100 mL/hr over 60 Minutes Intravenous  Once 09/02/20 0728 09/02/20 1040   09/02/20 0730  vancomycin (VANCOCIN) IVPB 1000 mg/200 mL premix  Status:  Discontinued        1,000 mg 200 mL/hr over 60 Minutes Intravenous  Once 09/02/20 0728 09/02/20 0733       I have personally reviewed the following labs and images: CBC: Recent Labs  Lab 09/10/20 0229 09/11/20 0158  WBC 9.7 10.1  HGB 11.8* 11.8*  HCT 35.3* 35.0*  MCV 89.6 89.1  PLT 344 383   BMP &GFR Recent Labs  Lab 09/10/20 0229 09/11/20 0158 09/11/20 0951  NA 135 136  --   K 4.6 4.2  --   CL 101 103  --   CO2 23 25  --   GLUCOSE 418* 326* 588*  BUN 14 14  --   CREATININE 0.92 0.94  --   CALCIUM 9.2 9.3  --   MG 1.7 1.7  --    Estimated Creatinine Clearance: 126.3 mL/min (by C-G formula based on SCr of 0.94 mg/dL). Liver & Pancreas: Recent Labs  Lab 09/10/20 0229  AST 17  ALT 21  ALKPHOS 63  BILITOT 0.5  PROT 6.4*  ALBUMIN 2.9*   No results for input(s): LIPASE, AMYLASE in the last 168 hours. No results for input(s): AMMONIA in the last 168 hours. Diabetic: Recent Labs    09/15/20 1116  HGBA1C 7.5*   Recent Labs  Lab 09/15/20 1801 09/15/20 1813 09/15/20 2146 09/15/20 2205 09/16/20 0646  GLUCAP 331* 362* 68* 136* 155*   Cardiac Enzymes:  Recent Labs  Lab  09/15/20 0923  CKTOTAL 133   No results for input(s): PROBNP in the last 8760 hours. Coagulation Profile: No results for input(s): INR, PROTIME in the last 168 hours. Thyroid Function Tests: No results for input(s): TSH, T4TOTAL, FREET4, T3FREE, THYROIDAB in the last 72 hours. Lipid Profile: No results for input(s): CHOL, HDL, LDLCALC, TRIG, CHOLHDL, LDLDIRECT in the last 72 hours. Anemia Panel: Recent Labs    09/15/20 0923  VITAMINB12 308  FOLATE 8.3   Urine analysis:    Component Value Date/Time   COLORURINE YELLOW 09/02/2020 0616   APPEARANCEUR HAZY (A) 09/02/2020 0616   APPEARANCEUR Clear 01/20/2015 2136   LABSPEC 1.013 09/02/2020 0616   LABSPEC 1.023 01/20/2015 2136   PHURINE 5.0 09/02/2020 0616   GLUCOSEU >=500 (A) 09/02/2020 0616   GLUCOSEU >=500 01/20/2015 2136   HGBUR SMALL (A) 09/02/2020 0616   BILIRUBINUR NEGATIVE 09/02/2020 0616   BILIRUBINUR Negative 01/20/2015 2136   KETONESUR 5 (A) 09/02/2020 0616   PROTEINUR 30 (A) 09/02/2020 0616   NITRITE NEGATIVE 09/02/2020 0616   LEUKOCYTESUR NEGATIVE 09/02/2020 0616   LEUKOCYTESUR Negative 01/20/2015 2136   Sepsis Labs: Invalid input(s): PROCALCITONIN, [place]  Microbiology: Recent Results (from the past 240 hour(s))  Culture, respiratory (tracheal aspirate)     Status: None   Collection Time: 09/06/20  2:41 PM   Specimen: Tracheal Aspirate; Respiratory  Result Value Ref Range Status   Specimen Description TRACHEAL ASPIRATE  Final   Special Requests NONE  Final   Gram Stain   Final    RARE WBC PRESENT, PREDOMINANTLY MONONUCLEAR NO ORGANISMS SEEN    Culture   Final    FEW Normal respiratory flora-no Staph aureus or Pseudomonas seen Performed at [facility], [address], [place], [place] [address]    Report Status 09/08/2020 FINAL  Final    Radiology Studies: No results found.   Bitha Fauteux T. [place]  If 7PM-7AM, please contact night-coverage [email or web address] 09/16/2020,  11:06 AM

## 2020-09-16 NOTE — Progress Notes (Signed)
Physical Therapy Treatment Patient Details Name: Mark [name] MRN: [id number] DOB: 04-01-1986 Today's Date: 09/16/2020    History of Present Illness 34 year old male with history of seizures on Depakote, type 1 diabetes with frequent episodes of DKA, hydrocephalus who presents to the emergency department on 11/12 with altered mental status with CBG in 500s and sonorous breath sounds.    PT Comments    Pt tolerates treatment well despite intermittent frustration. Pt continues to demonstrate a posterior lean during standing and ataxic gait which place him at a high risk for falls. Pt appears to have motor planning deficits at this time, with difficulty moving RLE to command in supine and with impaired coordination during gait tasks. Pt will benefit from continued acute PT POC to improve mobility quality and to reduce falls risk. PT updates recommendations to SNF placement as pt unable to afford CIR.  Follow Up Recommendations  Supervision/Assistance - 24 hour;SNF     Equipment Recommendations  Rolling walker with 5" wheels;3in1 (PT);Wheelchair (measurements PT);Wheelchair cushion (measurements PT)    Recommendations for Other Services       Precautions / Restrictions Precautions Precautions: Fall Restrictions Weight Bearing Restrictions: No    Mobility  Bed Mobility Overal bed mobility: Needs Assistance Bed Mobility: Supine to Sit;Sit to Supine     Supine to sit: Mod assist;HOB elevated Sit to supine: Mod assist      Transfers Overall transfer level: Needs assistance Equipment used: Rolling walker (2 wheeled) Transfers: Sit to/from [employer] Sit to Stand: Mod assist;Max assist Stand pivot transfers: Mod assist       General transfer comment: pt requires cues for foot placement, anterior trunk lean, hand placement, and rocking for momentum. Pt lacks LE power and requires PT physical assistance to power up into standing, initial posterior lean during  all standing attempts. Pt performs SPT with bilateral hand hold of PT  Ambulation/Gait Ambulation/Gait assistance: Max assist Gait Distance (Feet): 4 Feet (additional trial of 2') Assistive device: Rolling walker (2 wheeled) Gait Pattern/deviations: Step-to pattern;Decreased dorsiflexion - right;Ataxic;Scissoring Gait velocity: reduced Gait velocity interpretation: <1.31 ft/sec, indicative of household ambulator General Gait Details: pt with short step-to gait, R foot drag and scissoring of RLE toward midline   Stairs             Wheelchair Mobility    Modified Rankin (Stroke Patients Only)       Balance Overall balance assessment: Needs assistance Sitting-balance support: Single extremity supported;Bilateral upper extremity supported;Feet supported Sitting balance-Leahy Scale: Zero Sitting balance - Comments: maxA due to posterior lean, brief periods of minA with cues for anterior lean Postural control: Posterior lean Standing balance support: Bilateral upper extremity supported Standing balance-Leahy Scale: Poor Standing balance comment: modA with BUE support of RW                            Cognition Arousal/Alertness: Awake/alert Behavior During Therapy: WFL for tasks assessed/performed Overall Cognitive Status: No family/caregiver present to determine baseline cognitive functioning                                 General Comments: pt follows commands with increased time, demonstrates reduced awareness of deficits. Is able to retain PT cues and implement them later in session      Exercises      General Comments General comments (skin integrity, edema, etc.): VSS on RA  Pertinent Vitals/Pain Pain Assessment: No/denies pain    Home Living                      Prior Function            PT Goals (current goals can now be found in the care plan section) Acute Rehab PT Goals Patient Stated Goal: to improve and go  home Progress towards PT goals: Progressing toward goals    Frequency    Min 4X/week      PT Plan Current plan remains appropriate    Co-evaluation              AM-PAC PT "6 Clicks" Mobility   Outcome Measure  Help needed turning from your back to your side while in a flat bed without using bedrails?: A Little Help needed moving from lying on your back to sitting on the side of a flat bed without using bedrails?: A Lot Help needed moving to and from a bed to a chair (including a wheelchair)?: A Lot Help needed standing up from a chair using your arms (e.g., wheelchair or bedside chair)?: A Lot Help needed to walk in hospital room?: Total Help needed climbing 3-5 steps with a railing? : Total 6 Click Score: 11    End of Session Equipment Utilized During Treatment: Gait belt Activity Tolerance: Patient tolerated treatment well Patient left: in bed;with call bell/phone within reach;with bed alarm set Nurse Communication: Mobility status PT Visit Diagnosis: Other abnormalities of gait and mobility (R26.89);Muscle weakness (generalized) (M62.81);Unsteadiness on feet (R26.81);Difficulty in walking, not elsewhere classified (R26.2)     Time: 2355-7322 PT Time Calculation (min) (ACUTE ONLY): 28 min  Charges:  $Gait Training: 8-22 mins $Therapeutic Activity: 8-22 mins                     Arlyss Gandy, PT, DPT Acute Rehabilitation Pager: [phone]    Arlyss Gandy 09/16/2020, 3:58 PM

## 2020-09-16 NOTE — Progress Notes (Signed)
Inpatient Diabetes Program Recommendations  AACE/ADA: New Consensus Statement on Inpatient Glycemic Control (2015)  Target Ranges:  Prepandial:   less than 140 mg/dL      Peak postprandial:   less than 180 mg/dL (1-2 hours)      Critically ill patients:  140 - 180 mg/dL   Lab Results  Component Value Date   GLUCAP 155 (H) 09/16/2020   HGBA1C 7.5 (H) 09/15/2020    Review of Glycemic Control Results for Mark [name], Mark [name] (MRN [id number]) as of 09/16/2020 07:40  Ref. Range 09/15/2020 18:01 09/15/2020 18:13 09/15/2020 21:46 09/15/2020 22:05 09/16/2020 06:46  Glucose-Capillary Latest Ref Range: 70 - 99 mg/dL 315 (H) Novolog 19 units 362 (H) Novolog 19 units 68 (L) 136 (H) 155 (H)   Diabetes history: Type 1 DM Outpatient Diabetes medications: Novolog 70/30 35 units BID Current orders for Inpatient glycemic control: NPH 35 units bid + Novolog 4 units tid, Novolog 0-15 units tid + hs 0-5 units  Inpatient Diabetes Program Recommendations:    -Decrease Novolog correction to sensitive tid + hs 0-5 units Secure chat sent to Dr. Alanda Slim.  Thank you, Billy Fischer. Jemmie Rhinehart, RN, MSN, CDE  Diabetes Coordinator Inpatient Glycemic Control Team Team Pager [phone] (8am-5pm) 09/16/2020 7:58 AM

## 2020-09-17 LAB — GLUCOSE, CAPILLARY
Glucose-Capillary: 164 mg/dL — ABNORMAL HIGH (ref 70–99)
Glucose-Capillary: 52 mg/dL — ABNORMAL LOW (ref 70–99)
Glucose-Capillary: 46 mg/dL — ABNORMAL LOW (ref 70–99)
Glucose-Capillary: 77 mg/dL (ref 70–99)
Glucose-Capillary: 175 mg/dL — ABNORMAL HIGH (ref 70–99)
Glucose-Capillary: 296 mg/dL — ABNORMAL HIGH (ref 70–99)

## 2020-09-17 MED ORDER — INSULIN NPH (HUMAN) (ISOPHANE) 100 UNIT/ML ~~LOC~~ SUSP
25.0000 [IU] | Freq: Two times a day (BID) | SUBCUTANEOUS | Status: DC
  Administered 2020-09-17 – 2020-09-29 (×22): 25 [IU] via SUBCUTANEOUS

## 2020-09-17 NOTE — Progress Notes (Signed)
Patient refused v/s.

## 2020-09-17 NOTE — Progress Notes (Signed)
PROGRESS NOTE  Mark [name] WNU:[id number] DOB: 1986-04-11   PCP: Mark [name]  Patient is from: Home.  Reportedly lives with his friend. He says he co-owns the house  DOA: 09/02/2020 LOS: 15  Chief complaints: Altered mental status  Brief Narrative / Interim history: 34 year old male with history of DM-1, anxiety, depression, bipolar disorder, seizure disorder, hydrocephalus and tobacco use disorder brought to ED with altered mental status and found to be in severe DKA with encephalopathy requiring intubation and mechanical ventilation from 11/12-11/14. CT head showed marked hydrocephalus with acute artery duct stenosis and right frontal ventriculostomy site with drain.  Neurosurgery does not recommend any surgical intervention.  He was also treated for aspiration pneumonia with IV ceftriaxone and vancomycin.    Patient is currently on room air and dysphagia 3 diet.  Completed antibiotic course for pneumonia.  Therapy recommended CIR. However, patient has no insurance and does not appear to be a candidate for Medicaid.  He also cannot afford CIR. Patient is maximum assist to get out of the bed.  No family members.  He lives with his friend who is also disabled.  There is no safe disposition at this time.  Now therapy recommending SNF.  Subjective: Seen and examined earlier this morning.  No major events overnight of this morning.  No complaints other than difficulty sleeping.  He naps during the day.   Objective: Vitals:   09/16/20 2048 09/17/20 0012 09/17/20 0852 09/17/20 1151  BP: 114/80 118/80 107/71 109/75  Pulse: 75 82 82 86  Resp: _0 Temp:  97.9 F (36.6 C) 98 F (36.7 C) 98.1 F (36.7 C)  TempSrc: Oral Oral Oral Oral  SpO2: 98% 98% 96% 99%  Weight:      Height:        Intake/Output Summary (Last 24 hours) at 09/17/2020 1250 Last data filed at 09/17/2020 1215 Gross [name] 24 hour  Intake 240 ml  Output 1675 ml  Net -1435 ml   Filed Weights    09/07/20 0500 09/08/20 0500 09/09/20 0453  Weight: 103 kg 102.2 kg 97.4 kg    Examination:.   GENERAL: No apparent distress.  Nontoxic. HEENT: MMM.  Poor dentition.  Vision and hearing grossly intact.  NECK: Supple.  No apparent JVD.  RESP:  No IWOB.  Fair aeration bilaterally. CVS:  RRR. Heart sounds normal.  ABD/GI/GU: BS+. Abd soft, NTND.  MSK/EXT:  Moves extremities. No apparent deformity. No edema.  SKIN: no apparent skin lesion or wound NEURO: Awake, alert and oriented appropriately.  No apparent focal neuro deficit. PSYCH: Calm. Normal affect.   Procedures:  EKG 11/12-11/14  Microbiology summarized: COVID-19, influenza and RSV PCR nonreactive. MRSA PCR negative. Blood cultures NGTD. Respiratory culture normal respiratory flora.  Assessment & Plan: Ambulatory dysfunction, deconditioning, debility: Patient is currently maximum assist even to get out of the bed.  [name] patient and patient's friend, he was able to ambulate independently for most part prior to this hospitalization. His neuro exam appears intact except for bilateral lower extremity weakness (4/5).  His proprioception is off.  CK and vitamin B12 within normal.  TSH was elevated likely due to noncompliance with his Synthroid.  Therapy recommended CIR but he has no insurance.  Reportedly not a candidate for Medicaid.  Also cannot afford CIR. Lives with a friend who is disabled to take care of the patient.  No safe disposition at this time. -Check CRP and ESR -Continue therapy  Acute hypoxic respiratory  failure with compromised airway and aspiration pneumonitis: Resolved. -S/p intubation and mechanical ventilation 11/12-11/14.  Currently on room air. -Completed antibiotic course with Rocephin and vancomycin. -Aspiration precautions  Uncontrolled DM-1 with hypo and hyperglycemia: A1c 8.2%.  DKA resolved.  Labile CBG Recent Labs  Lab 09/16/20 2241 09/17/20 0627 09/17/20 1109 09/17/20 1138 09/17/20 1217  GLUCAP  209* 164* 52* 46* 77  -Decrease Novolin N to 25 units twice daily -Continue SSI-sensitive -Discontinue mealtime coverage -Further adjustment as appropriate  Acute metabolic encephalopathy: Likely due to the above.  Seems to have resolved. -Reorientation and delirium precautions  Anxiety/depression/bipolar disorder: Seems to be stable.  He endorsed and evaluated by psych on 09/11/2020.  Psychiatry did not think he is an imminent risk to self or others -Continue Seroquel, Depakote and Lexapro -IM Haldol as needed for agitation. -No imminent danger to self or others [name] psych  Chronic hydrocephalus: CT head showed marked hydrocephalus with acute artery duct stenosis and right frontal ventriculostomy site with drain. Neurosurgeon does not recommend any surgical intervention.    Dysphagia following intubation: SLP recommended dysphagia 3 diet. -Continue dysphagia-3 diet and SLP evaluation -Continue Protonix.  Hypokalemia, hypomagnesemia, hypophosphatemia: seems to have resolved but no recent labs. -Monitor intermittently  Anemia ofchronic disease: H&H stable. -Monitor intermittently  Homelessness? -patient lives with his friend Mark [name].  They co-own the house. Confirmed with Mark [name]  History of hyperlipidemia  -Continue Lipitor  Hypothyroidism: TSH was elevated at 14.4. Possibility of noncompliance.  -Continue Synthroid 50 mcg daily -Recheck TSH in 3 to 4 weeks    Body mass index is 27.57 kg/m. Nutrition Problem: Inadequate oral intake Etiology: inability to eat Signs/Symptoms: NPO status Interventions: Refer to RD note for recommendations   DVT prophylaxis:  SCDs Start: 09/02/20 0844  Code Status: Full code Family Communication: Updated patient's friend, Mark [name] over the phone with patient's permission on 11/25 Status is: Inpatient  Remains inpatient appropriate because:Unsafe d/c plan   Dispo: The patient is from: Home              Anticipated d/c is to: SNF               Anticipated d/c date is: > 3 days              Patient currently is medically stable to d/c.       Consultants:  Neurosurgery PCCM Psychiatry-signed off   Sch Meds:  Scheduled Meds: . atorvastatin  20 mg Oral Daily  . Chlorhexidine Gluconate Cloth  6 each Topical Daily  . divalproex  500 mg Oral Daily  . divalproex  750 mg Oral QPM  . escitalopram  20 mg Oral Daily  . feeding supplement (GLUCERNA SHAKE)  237 mL Oral TID BM  . insulin aspart  0-5 Units Subcutaneous QHS  . insulin aspart  0-9 Units Subcutaneous TID WC  . insulin aspart  4 Units Subcutaneous TID WC  . insulin NPH Human  35 Units Subcutaneous BID AC & HS  . levothyroxine  50 mcg Oral Q0600  . pantoprazole  40 mg Oral Q1200  . vitamin B-6  50 mg Oral Daily  . QUEtiapine  300 mg Oral QHS   Continuous Infusions: PRN Meds:.acetaminophen, docusate, haloperidol lactate, polyethylene glycol  Antimicrobials: Anti-infectives (From admission, onward)   Start     Dose/Rate Route Frequency Ordered Stop   09/03/20 0945  cefTRIAXone (ROCEPHIN) 2 g in sodium chloride 0.9 % 100 mL IVPB        2 g  200 mL/hr over 30 Minutes Intravenous Every 24 hours 09/03/20 0854 09/06/20 1126   09/03/20 0800  ceFEPIme (MAXIPIME) 2 g in sodium chloride 0.9 % 100 mL IVPB  Status:  Discontinued        2 g 200 mL/hr over 30 Minutes Intravenous Every 24 hours 09/02/20 0742 09/02/20 1331   09/02/20 2200  vancomycin (VANCOREADY) IVPB 750 mg/150 mL  Status:  Discontinued        750 mg 150 mL/hr over 60 Minutes Intravenous Every 12 hours 09/02/20 1329 09/03/20 0854   09/02/20 1800  ceFEPIme (MAXIPIME) 2 g in sodium chloride 0.9 % 100 mL IVPB  Status:  Discontinued        2 g 200 mL/hr over 30 Minutes Intravenous Every 8 hours 09/02/20 1331 09/03/20 0854   09/02/20 0745  ceFEPIme (MAXIPIME) 2 g in sodium chloride 0.9 % 100 mL IVPB        2 g 200 mL/hr over 30 Minutes Intravenous  Once 09/02/20 0732 09/02/20 0932   09/02/20 0745   vancomycin (VANCOREADY) IVPB 2000 mg/400 mL        2,000 mg 200 mL/hr over 120 Minutes Intravenous  Once 09/02/20 0733 09/02/20 1257   09/02/20 0741  vancomycin variable dose [name] unstable renal function (pharmacist dosing)  Status:  Discontinued         Does not apply See admin instructions 09/02/20 0742 09/02/20 1329   09/02/20 0730  aztreonam (AZACTAM) 2 g in sodium chloride 0.9 % 100 mL IVPB  Status:  Discontinued        2 g 200 mL/hr over 30 Minutes Intravenous  Once 09/02/20 0728 09/02/20 0732   09/02/20 0730  metroNIDAZOLE (FLAGYL) IVPB 500 mg        500 mg 100 mL/hr over 60 Minutes Intravenous  Once 09/02/20 0728 09/02/20 1040   09/02/20 0730  vancomycin (VANCOCIN) IVPB 1000 mg/200 mL premix  Status:  Discontinued        1,000 mg 200 mL/hr over 60 Minutes Intravenous  Once 09/02/20 0728 09/02/20 0733       I have personally reviewed the following labs and images: CBC: Recent Labs  Lab 09/11/20 0158  WBC 10.1  HGB 11.8*  HCT 35.0*  MCV 89.1  PLT 383   BMP &GFR Recent Labs  Lab 09/11/20 0158 09/11/20 0951  NA 136  --   K 4.2  --   CL 103  --   CO2 25  --   GLUCOSE 326* 588*  BUN 14  --   CREATININE 0.94  --   CALCIUM 9.3  --   MG 1.7  --    Estimated Creatinine Clearance: 126.3 mL/min (by C-G formula based on SCr of 0.94 mg/dL). Liver & Pancreas: No results for input(s): AST, ALT, ALKPHOS, BILITOT, PROT, ALBUMIN in the last 168 hours. No results for input(s): LIPASE, AMYLASE in the last 168 hours. No results for input(s): AMMONIA in the last 168 hours. Diabetic: Recent Labs    09/15/20 1116  HGBA1C 7.5*   Recent Labs  Lab 09/16/20 2241 09/17/20 0627 09/17/20 1109 09/17/20 1138 09/17/20 1217  GLUCAP 209* 164* 52* 46* 77   Cardiac Enzymes: Recent Labs  Lab 09/15/20 0923  CKTOTAL 133   No results for input(s): PROBNP in the last 8760 hours. Coagulation Profile: No results for input(s): INR, PROTIME in the last 168 hours. Thyroid Function  Tests: No results for input(s): TSH, T4TOTAL, FREET4, T3FREE, THYROIDAB in the last 72 hours. Lipid  Profile: No results for input(s): CHOL, HDL, LDLCALC, TRIG, CHOLHDL, LDLDIRECT in the last 72 hours. Anemia Panel: Recent Labs    09/15/20 0923  VITAMINB12 308  FOLATE 8.3   Urine analysis:    Component Value Date/Time   COLORURINE YELLOW 09/02/2020 0616   APPEARANCEUR HAZY (A) 09/02/2020 0616   APPEARANCEUR Clear 01/20/2015 2136   LABSPEC 1.013 09/02/2020 0616   LABSPEC 1.023 01/20/2015 2136   PHURINE 5.0 09/02/2020 0616   GLUCOSEU >=500 (A) 09/02/2020 0616   GLUCOSEU >=500 01/20/2015 2136   HGBUR SMALL (A) 09/02/2020 0616   BILIRUBINUR NEGATIVE 09/02/2020 0616   BILIRUBINUR Negative 01/20/2015 2136   KETONESUR 5 (A) 09/02/2020 0616   PROTEINUR 30 (A) 09/02/2020 0616   NITRITE NEGATIVE 09/02/2020 0616   LEUKOCYTESUR NEGATIVE 09/02/2020 0616   LEUKOCYTESUR Negative 01/20/2015 2136   Sepsis Labs: Invalid input(s): PROCALCITONIN, [place]  Microbiology: No results found for this or any previous visit (from the past 240 hour(s)).  Radiology Studies: No results found.   Rakesha Dalporto T. [place]  If 7PM-7AM, please contact night-coverage [email or web address] 09/17/2020, 12:50 PM

## 2020-09-18 LAB — GLUCOSE, CAPILLARY
Glucose-Capillary: 136 mg/dL — ABNORMAL HIGH (ref 70–99)
Glucose-Capillary: 225 mg/dL — ABNORMAL HIGH (ref 70–99)
Glucose-Capillary: 340 mg/dL — ABNORMAL HIGH (ref 70–99)
Glucose-Capillary: 435 mg/dL — ABNORMAL HIGH (ref 70–99)

## 2020-09-18 MED ORDER — INSULIN ASPART 100 UNIT/ML ~~LOC~~ SOLN
10.0000 [IU] | Freq: Once | SUBCUTANEOUS | Status: AC
  Administered 2020-09-18: 10 [IU] via SUBCUTANEOUS

## 2020-09-18 NOTE — Progress Notes (Signed)
PROGRESS NOTE  Mark [name] JJK:[id number] DOB: [birth year]   PCP: Patient, No Pcp Per  Patient is from: Home.  Reportedly lives with his friend. He says he co-owns the house  DOA: 09/02/2020 LOS: 16  Chief complaints: Altered mental status  Brief Narrative / Interim history: 34 year old male with history of DM-1, anxiety, depression, bipolar disorder, seizure disorder, hydrocephalus and tobacco use disorder brought to ED with altered mental status and found to be in severe DKA with encephalopathy requiring intubation and mechanical ventilation from 11/12-11/14. CT head showed marked hydrocephalus with acute artery duct stenosis and right frontal ventriculostomy site with drain.  Neurosurgery does not recommend any surgical intervention.  He was also treated for aspiration pneumonia with IV ceftriaxone and vancomycin.    Patient is currently on room air and dysphagia 3 diet.  Completed antibiotic course for pneumonia.  Therapy recommended CIR. However, patient has no insurance and does not appear to be a candidate for Medicaid.  He also cannot afford CIR. Patient is maximum assist to get out of the bed.  No family members.  He lives with his friend who is also disabled.  There is no safe disposition at this time.  Now therapy recommending SNF.  Subjective: Seen and examined earlier this morning.  No major events overnight of this morning.  No complaints other than difficulty sleeping.  He naps during the daytime.  I encouraged him to stay up during the day so he could sleep better at night.  Denies pain, new neuro symptoms, shortness of breath, GI or UTI symptoms.  Objective: Vitals:   09/17/20 1151 09/17/20 1719 09/17/20 1927 09/17/20 1936  BP: 109/75 109/72 114/76 114/76  Pulse: 86 90 78 78  Resp: 20 14  18   Temp: 98.1 F (36.7 C) 98.4 F (36.9 C) 98.2 F (36.8 C) 98.2 F (36.8 C)  TempSrc: Oral Oral Oral Oral  SpO2: 99% 99% 99% 99%  Weight:      Height:        Intake/Output  Summary (Last 24 hours) at 09/18/2020 1058 Last data filed at 09/18/2020 1037 Gross per 24 hour  Intake --  Output 1375 ml  Net -1375 ml   Filed Weights   09/07/20 0500 09/08/20 0500 09/09/20 0453  Weight: 103 kg 102.2 kg 97.4 kg    Examination:.   GENERAL: No apparent distress.  Nontoxic. HEENT: MMM.  Poor dentition.  Vision and hearing grossly intact.  NECK: Supple.  No apparent JVD.  RESP:  No IWOB.  Fair aeration bilaterally. CVS:  RRR. Heart sounds normal.  ABD/GI/GU: BS+. Abd soft, NTND.  MSK/EXT:  Moves extremities. No apparent deformity. No edema.  SKIN: no apparent skin lesion or wound NEURO: Awake, alert and oriented appropriately.  No apparent focal neuro deficit other than mild BLE weakness PSYCH: Calm. Normal affect.  Procedures:  EKG 11/12-11/14  Microbiology summarized: COVID-19, influenza and RSV PCR nonreactive. MRSA PCR negative. Blood cultures NGTD. Respiratory culture normal respiratory flora.  Assessment & Plan: Ambulatory dysfunction, deconditioning, debility: Patient is currently maximum assist even to get out of the bed.  Per patient and patient's friend, he was able to ambulate independently for most part prior to this hospitalization. His neuro exam appears intact except for bilateral lower extremity weakness (4/5).  His proprioception is off.  CK and vitamin B12 within normal.  TSH was elevated likely due to noncompliance with his Synthroid.  Therapy recommended CIR but he has no insurance.  Reportedly not a candidate for Medicaid.  Also cannot afford CIR. Lives with a friend who is disabled to take care of the patient.  No safe disposition at this time. -Continue therapy -Therapy recommended SNF.  Acute hypoxic respiratory failure with compromised airway and aspiration pneumonitis: Resolved. -S/p intubation and mechanical ventilation 11/12-11/14.  Currently on room air. -Completed antibiotic course with Rocephin and vancomycin. -Aspiration  precautions  Uncontrolled DM-1 with hypo and hyperglycemia: A1c 8.2%.  DKA resolved.  Labile CBG Recent Labs  Lab 09/17/20 1138 09/17/20 1217 09/17/20 1634 09/17/20 2105 09/18/20 0611  GLUCAP 46* 77 175* 296* 136*  -Continue Novolin N to 25 units twice daily -Continue SSI-sensitive -Further adjustment as appropriate  Acute metabolic encephalopathy: Likely due to the above.  Seems to have resolved. -Reorientation and delirium precautions  Anxiety/depression/bipolar disorder/insomnia: Seems to be stable.  He endorsed and evaluated by psych on 09/11/2020.  Psychiatry did not think he is an imminent risk to self or others -Continue Seroquel, Depakote and Lexapro -IM Haldol as needed for agitation. -No imminent danger to self or others per psych -I advised him to stay awake as much as possible so he could sleep better at night.  Chronic hydrocephalus: CT head showed marked hydrocephalus with acute artery duct stenosis and right frontal ventriculostomy site with drain. Neurosurgeon does not recommend any surgical intervention.    Dysphagia following intubation: SLP recommended dysphagia 3 diet. -Continue dysphagia-3 diet and SLP evaluation -Continue Protonix.  Hypokalemia, hypomagnesemia, hypophosphatemia: seems to have resolved but no recent labs. -Monitor intermittently  Anemia ofchronic disease: H&H stable. -Monitor intermittently  Homelessness? -patient lives with his friend Mark [name].  They co-own the house. Confirmed with [name]  History of hyperlipidemia  -Continue Lipitor  Hypothyroidism: TSH was elevated at 14.4. Possibility of noncompliance.  -Continue Synthroid 50 mcg daily -Recheck TSH in 3 to 4 weeks    Body mass index is 27.57 kg/m. Nutrition Problem: Inadequate oral intake Etiology: inability to eat Signs/Symptoms: NPO status Interventions: Refer to RD note for recommendations   DVT prophylaxis:  SCDs Start: 09/02/20 0844  Code Status: Full  code Family Communication: Updated patient's friend, Mark [name] over the phone with patient's permission on 11/25 Status is: Inpatient  Remains inpatient appropriate because:Unsafe d/c plan   Dispo: The patient is from: Home              Anticipated d/c is to: SNF              Anticipated d/c date is: > 3 days              Patient currently is medically stable to d/c.       Consultants:  Neurosurgery PCCM Psychiatry-signed off   Sch Meds:  Scheduled Meds: . atorvastatin  20 mg Oral Daily  . Chlorhexidine Gluconate Cloth  6 each Topical Daily  . divalproex  500 mg Oral Daily  . divalproex  750 mg Oral QPM  . escitalopram  20 mg Oral Daily  . feeding supplement (GLUCERNA SHAKE)  237 mL Oral TID BM  . insulin aspart  0-5 Units Subcutaneous QHS  . insulin aspart  0-9 Units Subcutaneous TID WC  . insulin NPH Human  25 Units Subcutaneous BID AC & HS  . levothyroxine  50 mcg Oral Q0600  . pantoprazole  40 mg Oral Q1200  . vitamin B-6  50 mg Oral Daily  . QUEtiapine  300 mg Oral QHS   Continuous Infusions: PRN Meds:.acetaminophen, docusate, haloperidol lactate, polyethylene glycol  Antimicrobials: Anti-infectives (From admission, onward)  Start     Dose/Rate Route Frequency Ordered Stop   09/03/20 0945  cefTRIAXone (ROCEPHIN) 2 g in sodium chloride 0.9 % 100 mL IVPB        2 g 200 mL/hr over 30 Minutes Intravenous Every 24 hours 09/03/20 0854 09/06/20 1126   09/03/20 0800  ceFEPIme (MAXIPIME) 2 g in sodium chloride 0.9 % 100 mL IVPB  Status:  Discontinued        2 g 200 mL/hr over 30 Minutes Intravenous Every 24 hours 09/02/20 0742 09/02/20 1331   09/02/20 2200  vancomycin (VANCOREADY) IVPB 750 mg/150 mL  Status:  Discontinued        750 mg 150 mL/hr over 60 Minutes Intravenous Every 12 hours 09/02/20 1329 09/03/20 0854   09/02/20 1800  ceFEPIme (MAXIPIME) 2 g in sodium chloride 0.9 % 100 mL IVPB  Status:  Discontinued        2 g 200 mL/hr over 30 Minutes Intravenous Every  8 hours 09/02/20 1331 09/03/20 0854   09/02/20 0745  ceFEPIme (MAXIPIME) 2 g in sodium chloride 0.9 % 100 mL IVPB        2 g 200 mL/hr over 30 Minutes Intravenous  Once 09/02/20 0732 09/02/20 0932   09/02/20 0745  vancomycin (VANCOREADY) IVPB 2000 mg/400 mL        2,000 mg 200 mL/hr over 120 Minutes Intravenous  Once 09/02/20 0733 09/02/20 1257   09/02/20 0741  vancomycin variable dose per unstable renal function (pharmacist dosing)  Status:  Discontinued         Does not apply See admin instructions 09/02/20 0742 09/02/20 1329   09/02/20 0730  aztreonam (AZACTAM) 2 g in sodium chloride 0.9 % 100 mL IVPB  Status:  Discontinued        2 g 200 mL/hr over 30 Minutes Intravenous  Once 09/02/20 0728 09/02/20 0732   09/02/20 0730  metroNIDAZOLE (FLAGYL) IVPB 500 mg        500 mg 100 mL/hr over 60 Minutes Intravenous  Once 09/02/20 0728 09/02/20 1040   09/02/20 0730  vancomycin (VANCOCIN) IVPB 1000 mg/200 mL premix  Status:  Discontinued        1,000 mg 200 mL/hr over 60 Minutes Intravenous  Once 09/02/20 0728 09/02/20 0733       I have personally reviewed the following labs and images: CBC: No results for input(s): WBC, NEUTROABS, HGB, HCT, MCV, PLT in the last 168 hours. BMP &GFR No results for input(s): NA, K, CL, CO2, GLUCOSE, BUN, CREATININE, CALCIUM, MG, PHOS in the last 168 hours.  Invalid input(s):  GFRCG Estimated Creatinine Clearance: 126.3 mL/min (by C-G formula based on SCr of 0.94 mg/dL). Liver & Pancreas: No results for input(s): AST, ALT, ALKPHOS, BILITOT, PROT, ALBUMIN in the last 168 hours. No results for input(s): LIPASE, AMYLASE in the last 168 hours. No results for input(s): AMMONIA in the last 168 hours. Diabetic: Recent Labs    09/15/20 1116  HGBA1C 7.5*   Recent Labs  Lab 09/17/20 1138 09/17/20 1217 09/17/20 1634 09/17/20 2105 09/18/20 0611  GLUCAP 46* 77 175* 296* 136*   Cardiac Enzymes: Recent Labs  Lab 09/15/20 0923  CKTOTAL 133   No results  for input(s): PROBNP in the last 8760 hours. Coagulation Profile: No results for input(s): INR, PROTIME in the last 168 hours. Thyroid Function Tests: No results for input(s): TSH, T4TOTAL, FREET4, T3FREE, THYROIDAB in the last 72 hours. Lipid Profile: No results for input(s): CHOL, HDL, LDLCALC, TRIG, CHOLHDL,  LDLDIRECT in the last 72 hours. Anemia Panel: No results for input(s): VITAMINB12, FOLATE, FERRITIN, TIBC, IRON, RETICCTPCT in the last 72 hours. Urine analysis:    Component Value Date/Time   COLORURINE YELLOW 09/02/2020 0616   APPEARANCEUR HAZY (A) 09/02/2020 0616   APPEARANCEUR Clear 01/20/2015 2136   LABSPEC 1.013 09/02/2020 0616   LABSPEC 1.023 01/20/2015 2136   PHURINE 5.0 09/02/2020 0616   GLUCOSEU >=500 (A) 09/02/2020 0616   GLUCOSEU >=500 01/20/2015 2136   HGBUR SMALL (A) 09/02/2020 0616   BILIRUBINUR NEGATIVE 09/02/2020 0616   BILIRUBINUR Negative 01/20/2015 2136   KETONESUR 5 (A) 09/02/2020 0616   PROTEINUR 30 (A) 09/02/2020 0616   NITRITE NEGATIVE 09/02/2020 0616   LEUKOCYTESUR NEGATIVE 09/02/2020 0616   LEUKOCYTESUR Negative 01/20/2015 2136   Sepsis Labs: Invalid input(s): PROCALCITONIN, LACTICIDVEN  Microbiology: No results found for this or any previous visit (from the past 240 hour(s)).  Radiology Studies: No results found.   Emalee Knies T. Johnny Gorter [facility]  If 7PM-7AM, please contact night-coverage [email or web address] 09/18/2020, 10:58 AM

## 2020-09-18 NOTE — TOC Progression Note (Addendum)
Transition of Care [facility]) - Progression Note    Patient Details  Name: Mark [name] MRN: [id number] Date of Birth: October 24, 1985  [facility] CM/SW Contact  Pollie Friar, RN Phone Number: 09/18/2020, 11:44 AM  Clinical Narrative:    Pt has refused CIR. CM met with the patient and he is refusing SNF rehab also. CM updated him that we would get very little to no therapy at home. Pt continues to want to dc/ home. CM will speak with Team lead in am to see what LOG DME he will be provided.  TOC following.  1400: CM spoke to pts roommate and the issues is patient is unable to do stairs and there are about 12-13 steps to enter the home and no ramp. Pt agreeable to having information sent out through care 360 to see if able to find someone to provide a ramp.   TOC following.    Barriers to Discharge: Inadequate or no insurance  Expected Discharge Plan and Services                                     HH Arranged: PT, OT, Speech Therapy [place] Agency: [place] ([facility]) Date [place]: 09/10/20 Time [place]: 61 Representative spoke with at [place]: [place] ([place]) Interventions    Readmission Risk Interventions Readmission Risk Prevention Plan 03/25/2020  Transportation Screening Complete  Medication Review [employer]) Complete  PCP or Specialist appointment within 3-5 days of discharge Complete  HRI or [employer] (No Data)  SW Recovery Care/Counseling Consult Complete  [place] Not Applicable  Some recent data might be hidden

## 2020-09-18 NOTE — Progress Notes (Signed)
Patient refused VS

## 2020-09-19 LAB — GLUCOSE, CAPILLARY
Glucose-Capillary: 228 mg/dL — ABNORMAL HIGH (ref 70–99)
Glucose-Capillary: 292 mg/dL — ABNORMAL HIGH (ref 70–99)
Glucose-Capillary: 241 mg/dL — ABNORMAL HIGH (ref 70–99)
Glucose-Capillary: 393 mg/dL — ABNORMAL HIGH (ref 70–99)

## 2020-09-19 MED ORDER — MELATONIN 3 MG PO TABS
6.0000 mg | ORAL_TABLET | Freq: Every day | ORAL | Status: DC
  Administered 2020-09-19 – 2020-09-30 (×12): 6 mg via ORAL
  Filled 2020-09-19 (×12): qty 2

## 2020-09-19 NOTE — Progress Notes (Signed)
RD Sign Off Note  Admitting Dx: Status epilepticus (HCC) [G40.901] SIRS (systemic inflammatory response syndrome) (HCC) [R65.10] Altered mental status, unspecified altered mental status type [R41.82] Type 1 diabetes mellitus with ketoacidosis without coma (HCC) [E10.10] AMS (altered mental status) [R41.82]  Pt with difficult discharge.   Wt Readings from Last 15 Encounters:  09/09/20 97.4 kg  06/21/20 113.4 kg  04/30/20 113.4 kg  04/22/20 102.2 kg  04/14/20 113.4 kg  04/04/20 113.4 kg  03/25/20 112.8 kg  03/05/20 113.4 kg  02/02/20 106.7 kg  01/01/20 113.4 kg  12/29/19 113.4 kg  05/31/18 113.4 kg  04/24/18 108.9 kg  04/22/18 108.9 kg  04/22/18 108.9 kg    Body mass index is 27.57 kg/m. Patient meets criteria for overweight based on current BMI.   Current diet order is carb modified, patient is consuming 100% of meals at this time. Pt receiving Glucerna TID which he is consuming well per RN.  Labs and medications reviewed.   No further nutrition interventions warranted at this time. If nutrition issues arise, please consult RD.   Eugene Gavia, MS, RD, LDN RD pager number and weekend/on-call pager number located in [place].

## 2020-09-19 NOTE — Progress Notes (Signed)
PROGRESS NOTE  Mark [name] TDS:[id number] DOB: Aug 10, 1986   PCP: Patient, No Pcp Per  Patient is from: Home.  Reportedly lives with his friend. He says he co-owns the house  DOA: 09/02/2020 LOS: 17  Chief complaints: Altered mental status  Brief Narrative / Interim history: 34 year old male with history of DM-1, anxiety, depression, bipolar disorder, seizure disorder, hydrocephalus and tobacco use disorder brought to ED with altered mental status and found to be in severe DKA with encephalopathy requiring intubation and mechanical ventilation from 11/12-11/14. CT head showed marked hydrocephalus with acute artery duct stenosis and right frontal ventriculostomy site with drain.  Neurosurgery does not recommend any surgical intervention.  He was also treated for aspiration pneumonia with IV ceftriaxone and vancomycin.    Patient is currently on room air and dysphagia 3 diet.  Completed antibiotic course for pneumonia.  Therapy recommended CIR. However, patient has no insurance and does not appear to be a candidate for Medicaid.  He also cannot afford CIR. Patient is maximum assist to get out of the bed.  No family members.  He lives with his friend who is also disabled.  There is no safe disposition at this time.  Now therapy recommending SNF.  Subjective: Seen and examined earlier this morning.  No major events overnight of this morning.  Continues to endorse difficulty sleeping at night despite taking Seroquel 300 mg nightly.  No other complaints.  Objective: Vitals:   09/17/20 1719 09/17/20 1927 09/17/20 1936 09/18/20 1952  BP: 109/72 114/76 114/76 111/72  Pulse: 90 78 78 81  Resp: 14  18 18   Temp: 98.4 F (36.9 C) 98.2 F (36.8 C) 98.2 F (36.8 C) 98.3 F (36.8 C)  TempSrc: Oral Oral Oral Oral  SpO2: 99% 99% 99% 100%  Weight:      Height:        Intake/Output Summary (Last 24 hours) at 09/19/2020 1102 Last data filed at 09/19/2020 0923 Gross per 24 hour  Intake --   Output 925 ml  Net -925 ml   Filed Weights   09/07/20 0500 09/08/20 0500 09/09/20 0453  Weight: 103 kg 102.2 kg 97.4 kg    Examination:.   GENERAL: No apparent distress.  Nontoxic. HEENT: MMM.  Poor dentition.  Vision and hearing grossly intact.  NECK: Supple.  No apparent JVD.  RESP:  No IWOB.  Fair aeration bilaterally. CVS:  RRR. Heart sounds normal.  ABD/GI/GU: BS+. Abd soft, NTND.  MSK/EXT:  Moves extremities. No apparent deformity. No edema.  SKIN: no apparent skin lesion or wound NEURO: Awake, alert and oriented appropriately.  No apparent focal neuro deficit other than mild BLE weakness PSYCH: Calm. Normal affect.  Procedures:  EKG 11/12-11/14  Microbiology summarized: COVID-19, influenza and RSV PCR nonreactive. MRSA PCR negative. Blood cultures NGTD. Respiratory culture normal respiratory flora.  Assessment & Plan: Ambulatory dysfunction, deconditioning, debility: Patient is currently maximum assist even to get out of the bed.  Per patient and patient's friend, he was able to ambulate independently for most part prior to this hospitalization. His neuro exam appears intact except for bilateral lower extremity weakness (4/5).  His proprioception is off.  CK and vitamin B12 within normal.  TSH was elevated likely due to noncompliance with his Synthroid.  Therapy recommended CIR but he has no insurance.  Reportedly not a candidate for Medicaid.  Also cannot afford CIR. Lives with a friend who is disabled to take care of the patient.  No safe disposition at this time. -  Continue therapy -Therapy recommended SNF.  Acute hypoxic respiratory failure with compromised airway and aspiration pneumonitis: Resolved. -S/p intubation and mechanical ventilation 11/12-11/14.  Currently on room air. -Completed antibiotic course with Rocephin and vancomycin. -Aspiration precautions  Uncontrolled DM-1 with hypo and hyperglycemia: A1c 8.2%.  DKA resolved.  Labile CBG Recent Labs  Lab  09/18/20 0611 09/18/20 1214 09/18/20 1623 09/18/20 2125 09/19/20 0628  GLUCAP 136* 225* 340* 435* 228*  -Continue Novolin N to 25 units twice daily -Continue SSI-sensitive -Further adjustment as appropriate  Acute metabolic encephalopathy: Likely due to the above.  Seems to have resolved. -Reorientation and delirium precautions  Anxiety/depression/bipolar disorder/insomnia: Seems to be stable.  He endorsed and evaluated by psych on 09/11/2020.  Psychiatry did not think he is an imminent risk to self or others -Continue Seroquel, Depakote and Lexapro -IM Haldol as needed for agitation. -No imminent danger to self or others per psych -Discussed sleep hygiene about insomnia. -Add melatonin at night.  Chronic hydrocephalus: CT head showed marked hydrocephalus with acute artery duct stenosis and right frontal ventriculostomy site with drain. Neurosurgeon does not recommend any surgical intervention.    Dysphagia following intubation: SLP recommended dysphagia 3 diet. -Continue dysphagia-3 diet and SLP evaluation -Continue Protonix.  Hypokalemia, hypomagnesemia, hypophosphatemia: seems to have resolved but no recent labs. -Monitor intermittently  Anemia ofchronic disease: H&H stable. -Monitor intermittently  Homelessness? -patient lives with his friend Mark [name].  They co-own the house. Confirmed with Mark [name]  History of hyperlipidemia  -Continue Lipitor  Hypothyroidism: TSH was elevated at 14.4. Possibility of noncompliance.  -Continue Synthroid 50 mcg daily -Recheck TSH in 3 to 4 weeks    Body mass index is 27.57 kg/m. Nutrition Problem: Inadequate oral intake Etiology: inability to eat Signs/Symptoms: NPO status Interventions: Refer to RD note for recommendations   DVT prophylaxis:  SCDs Start: 09/02/20 0844  Code Status: Full code Family Communication: Patient and RN.  None at bedside. Status is: Inpatient  Remains inpatient appropriate because:Unsafe d/c  plan   Dispo: The patient is from: Home              Anticipated d/c is to: SNF              Anticipated d/c date is: > 3 days              Patient currently is medically stable to d/c.       Consultants:  Neurosurgery PCCM Psychiatry-signed off   Sch Meds:  Scheduled Meds:  atorvastatin  20 mg Oral Daily   Chlorhexidine Gluconate Cloth  6 each Topical Daily   divalproex  500 mg Oral Daily   divalproex  750 mg Oral QPM   escitalopram  20 mg Oral Daily   feeding supplement (GLUCERNA SHAKE)  237 mL Oral TID BM   insulin aspart  0-5 Units Subcutaneous QHS   insulin aspart  0-9 Units Subcutaneous TID WC   insulin NPH Human  25 Units Subcutaneous BID AC & HS   levothyroxine  50 mcg Oral Q0600   melatonin  6 mg Oral QHS   pantoprazole  40 mg Oral Q1200   vitamin B-6  50 mg Oral Daily   QUEtiapine  300 mg Oral QHS   Continuous Infusions: PRN Meds:.acetaminophen, docusate, haloperidol lactate, polyethylene glycol  Antimicrobials: Anti-infectives (From admission, onward)   Start     Dose/Rate Route Frequency Ordered Stop   09/03/20 0945  cefTRIAXone (ROCEPHIN) 2 g in sodium chloride 0.9 % 100 mL IVPB  2 g 200 mL/hr over 30 Minutes Intravenous Every 24 hours 09/03/20 0854 09/06/20 1126   09/03/20 0800  ceFEPIme (MAXIPIME) 2 g in sodium chloride 0.9 % 100 mL IVPB  Status:  Discontinued        2 g 200 mL/hr over 30 Minutes Intravenous Every 24 hours 09/02/20 0742 09/02/20 1331   09/02/20 2200  vancomycin (VANCOREADY) IVPB 750 mg/150 mL  Status:  Discontinued        750 mg 150 mL/hr over 60 Minutes Intravenous Every 12 hours 09/02/20 1329 09/03/20 0854   09/02/20 1800  ceFEPIme (MAXIPIME) 2 g in sodium chloride 0.9 % 100 mL IVPB  Status:  Discontinued        2 g 200 mL/hr over 30 Minutes Intravenous Every 8 hours 09/02/20 1331 09/03/20 0854   09/02/20 0745  ceFEPIme (MAXIPIME) 2 g in sodium chloride 0.9 % 100 mL IVPB        2 g 200 mL/hr over 30 Minutes  Intravenous  Once 09/02/20 0732 09/02/20 0932   09/02/20 0745  vancomycin (VANCOREADY) IVPB 2000 mg/400 mL        2,000 mg 200 mL/hr over 120 Minutes Intravenous  Once 09/02/20 0733 09/02/20 1257   09/02/20 0741  vancomycin variable dose per unstable renal function (pharmacist dosing)  Status:  Discontinued         Does not apply See admin instructions 09/02/20 0742 09/02/20 1329   09/02/20 0730  aztreonam (AZACTAM) 2 g in sodium chloride 0.9 % 100 mL IVPB  Status:  Discontinued        2 g 200 mL/hr over 30 Minutes Intravenous  Once 09/02/20 0728 09/02/20 0732   09/02/20 0730  metroNIDAZOLE (FLAGYL) IVPB 500 mg        500 mg 100 mL/hr over 60 Minutes Intravenous  Once 09/02/20 0728 09/02/20 1040   09/02/20 0730  vancomycin (VANCOCIN) IVPB 1000 mg/200 mL premix  Status:  Discontinued        1,000 mg 200 mL/hr over 60 Minutes Intravenous  Once 09/02/20 0728 09/02/20 0733       I have personally reviewed the following labs and images: CBC: No results for input(s): WBC, NEUTROABS, HGB, HCT, MCV, PLT in the last 168 hours. BMP &GFR No results for input(s): NA, K, CL, CO2, GLUCOSE, BUN, CREATININE, CALCIUM, MG, PHOS in the last 168 hours.  Invalid input(s):  GFRCG Estimated Creatinine Clearance: 126.3 mL/min (by C-G formula based on SCr of 0.94 mg/dL). Liver & Pancreas: No results for input(s): AST, ALT, ALKPHOS, BILITOT, PROT, ALBUMIN in the last 168 hours. No results for input(s): LIPASE, AMYLASE in the last 168 hours. No results for input(s): AMMONIA in the last 168 hours. Diabetic: No results for input(s): HGBA1C in the last 72 hours. Recent Labs  Lab 09/18/20 0611 09/18/20 1214 09/18/20 1623 09/18/20 2125 09/19/20 0628  GLUCAP 136* 225* 340* 435* 228*   Cardiac Enzymes: Recent Labs  Lab 09/15/20 0923  CKTOTAL 133   No results for input(s): PROBNP in the last 8760 hours. Coagulation Profile: No results for input(s): INR, PROTIME in the last 168 hours. Thyroid Function  Tests: No results for input(s): TSH, T4TOTAL, FREET4, T3FREE, THYROIDAB in the last 72 hours. Lipid Profile: No results for input(s): CHOL, HDL, LDLCALC, TRIG, CHOLHDL, LDLDIRECT in the last 72 hours. Anemia Panel: No results for input(s): VITAMINB12, FOLATE, FERRITIN, TIBC, IRON, RETICCTPCT in the last 72 hours. Urine analysis:    Component Value Date/Time   COLORURINE YELLOW 09/02/2020  3748   APPEARANCEUR HAZY (A) 09/02/2020 0616   APPEARANCEUR Clear 01/20/2015 2136   LABSPEC 1.013 09/02/2020 0616   LABSPEC 1.023 01/20/2015 2136   PHURINE 5.0 09/02/2020 0616   GLUCOSEU >=500 (A) 09/02/2020 0616   GLUCOSEU >=500 01/20/2015 2136   HGBUR SMALL (A) 09/02/2020 0616   BILIRUBINUR NEGATIVE 09/02/2020 0616   BILIRUBINUR Negative 01/20/2015 2136   KETONESUR 5 (A) 09/02/2020 0616   PROTEINUR 30 (A) 09/02/2020 0616   NITRITE NEGATIVE 09/02/2020 0616   LEUKOCYTESUR NEGATIVE 09/02/2020 0616   LEUKOCYTESUR Negative 01/20/2015 2136   Sepsis Labs: Invalid input(s): PROCALCITONIN, LACTICIDVEN  Microbiology: No results found for this or any previous visit (from the past 240 hour(s)).  Radiology Studies: No results found.   Amorina Doerr T. Cristofer Yaffe [facility]  If 7PM-7AM, please contact night-coverage [email or web address] 09/19/2020, 11:02 AM

## 2020-09-19 NOTE — Progress Notes (Signed)
Physical Therapy Treatment Patient Details Name: Mark [name] MRN: [id number] DOB: 10-14-1986 Today's Date: 09/19/2020    History of Present Illness 34 year old male with history of seizures on Depakote, type 1 diabetes with frequent episodes of DKA, hydrocephalus who presents to the emergency department on 11/12 with altered mental status with CBG in 500s and sonorous breath sounds.    PT Comments    Pt very agitated this date in regards to her his current physical capabilities and safe discharge planning. He wants to go home rather than to a SNF or CIR currently and he believes that his roommate who also has physical disabilities and uses a cane can help him adequately. He also repeatedly stated that when he gets home he will be able to walk and go up/down the >12 stairs to enter/exit his home. Thus, the pt was allowed to attempt to perform all functional mobility with the least amount of assistance - no assistance initially, but he ultimately required extensive assistance to complete all tasks. He required modA to transition supine > sit EOB with bed flat and no use of bed rails to simulate home set-up along with modA to return to supine once pt was stuck lying perpendicular in bed and requested assistance. He required modAx2 to come to stand with a RW and maxAx2 to take several small steps before trunk flexion increased as he fatigued and required a seat quickly placed behind him. The pt is unaware of his deficits and these safety concerns despite allowing him to see his difficulty with all tasks this date. Plan to coordinate with SLP team to determine pt's ability to make safe decisions. Will continue to follow acutely. Current recommendations remain appropriate, but if pt refuses all venues then recommending HH PT.    Follow Up Recommendations  Supervision/Assistance - 24 hour;SNF     Equipment Recommendations  Rolling walker with 5" wheels;3in1 (PT);Wheelchair (measurements PT);Wheelchair  cushion (measurements PT);Other (comment) (ramp)    Recommendations for Other Services       Precautions / Restrictions Precautions Precautions: Fall Restrictions Weight Bearing Restrictions: No    Mobility  Bed Mobility Overal bed mobility: Needs Assistance Bed Mobility: Supine to Sit;Sit to Supine     Supine to sit: Mod assist Sit to supine: Mod assist   General bed mobility comments: Pt started to move legs to L EOB to transition to sit EOB but then stopped when legs were partially off EOB thus cued pt to complete with him stating he could not do it. Provided step-by-step cues to sequence L leg abduction > R leg adduction repeatedly to get legs off EOB and hooked on EOB. Bed flat and no use of bed rails to sit up to simulate home, with cues for hands on edge of mattress. Pt unable to sequence transition to L elbow then hand to ascend trunk, thus modA to complete. Pt initially attempted to return to supine with HOB elevated and was allowed to attempt independently, but pt got stuck lying perpendicular in bed and ultimately requested for help. Cued pt to scoot trunk laterally to L and lift legs with modA to manage legs onto L EOB.   Transfers Overall transfer level: Needs assistance Equipment used: Rolling walker (2 wheeled) Transfers: Sit to/from [employer] Sit to Stand: Mod assist;+2 physical assistance   Squat pivot transfers: Mod assist;+2 physical assistance     General transfer comment: Pt pulls up on RW despite cues for hands on current seated surface. Provided tactile and  verbal cues to bring feet posterior. Extra time and modAx2 to power up to stand or squat pivot to R.  Ambulation/Gait Ambulation/Gait assistance: Max assist;+2 physical assistance Gait Distance (Feet): 2 Feet (x2 attempts with ~1 ft > ~2 ft) Assistive device: Rolling walker (2 wheeled) Gait Pattern/deviations: Step-to pattern;Decreased dorsiflexion - right;Ataxic;Scissoring;Trunk  flexed Gait velocity: reduced Gait velocity interpretation: <1.31 ft/sec, indicative of household ambulator General Gait Details: pt with short step-to gait, R foot drag and scissoring of RLE toward midline. Trunk flexion increased as time in standing progressed. Pt pushes RW distal from body. Cues provided to inc step length and posture but pt frustrated and continued to slump to sit each bout.   Stairs             Wheelchair Mobility    Modified Rankin (Stroke Patients Only)       Balance Overall balance assessment: Needs assistance Sitting-balance support: Bilateral upper extremity supported;Feet supported Sitting balance-Leahy Scale: Zero Sitting balance - Comments: maxA due to posterior lean, brief periods of minA with cues for anterior lean Postural control: Posterior lean Standing balance support: Bilateral upper extremity supported Standing balance-Leahy Scale: Poor Standing balance comment: modAx2 with BUE support of RW                            Cognition Arousal/Alertness: Awake/alert Behavior During Therapy: Agitated Overall Cognitive Status: No family/caregiver present to determine baseline cognitive functioning                                 General Comments: Pt very agitated this date in regards to wanting to go home and being educated that if he is unable to ambulate or negotiate stairs that this may not be the safest option. Pt unaware of his deficits and safety concerns and requires extensive cues and physical assistance to maintain his safety with all mobility. Pt stated he was unable to get his legs off the bed but had just moved his legs toward the EOB.      Exercises      General Comments        Pertinent Vitals/Pain Pain Assessment: Faces Faces Pain Scale: Hurts a little bit Pain Location: low back with sitting up Pain Descriptors / Indicators: Discomfort Pain Intervention(s): Limited activity within patient's  tolerance;Monitored during session;Repositioned    Home Living                      Prior Function            PT Goals (current goals can now be found in the care plan section) Acute Rehab PT Goals Patient Stated Goal: to go home PT Goal Formulation: With patient Time For Goal Achievement: 09/21/20 Potential to Achieve Goals: Fair Progress towards PT goals: Not progressing toward goals - comment (pt frustrated this date)    Frequency    Min 4X/week      PT Plan Current plan remains appropriate    Co-evaluation              AM-PAC PT "6 Clicks" Mobility   Outcome Measure  Help needed turning from your back to your side while in a flat bed without using bedrails?: A Little Help needed moving from lying on your back to sitting on the side of a flat bed without using bedrails?: A Lot Help needed moving to  and from a bed to a chair (including a wheelchair)?: A Lot Help needed standing up from a chair using your arms (e.g., wheelchair or bedside chair)?: A Lot Help needed to walk in hospital room?: Total Help needed climbing 3-5 steps with a railing? : Total 6 Click Score: 11    End of Session Equipment Utilized During Treatment: Gait belt Activity Tolerance: Treatment limited secondary to agitation Patient left: in bed;with call bell/phone within reach;with bed alarm set   PT Visit Diagnosis: Other abnormalities of gait and mobility (R26.89);Muscle weakness (generalized) (M62.81);Unsteadiness on feet (R26.81);Difficulty in walking, not elsewhere classified (R26.2)     Time: 0258-5277 PT Time Calculation (min) (ACUTE ONLY): 21 min  Charges:  $Therapeutic Activity: 8-22 mins                     [name], PT, DPT Acute Rehabilitation Services  Pager: [phone] Office: [phone]    [name] 09/19/2020, 3:11 PM

## 2020-09-20 DIAGNOSIS — Z0189 Encounter for other specified special examinations: Secondary | ICD-10-CM

## 2020-09-20 LAB — GLUCOSE, CAPILLARY
Glucose-Capillary: 122 mg/dL — ABNORMAL HIGH (ref 70–99)
Glucose-Capillary: 352 mg/dL — ABNORMAL HIGH (ref 70–99)
Glucose-Capillary: 336 mg/dL — ABNORMAL HIGH (ref 70–99)
Glucose-Capillary: 273 mg/dL — ABNORMAL HIGH (ref 70–99)

## 2020-09-20 NOTE — Progress Notes (Signed)
Occupational Therapy Treatment Patient Details Name: Mark [name] MRN: [id number] DOB: 21-Sep-1986 Today's Date: 09/20/2020    History of present illness 34 year old male with history of seizures on Depakote, type 1 diabetes with frequent episodes of DKA, hydrocephalus who presents to the emergency department on 11/12 with altered mental status with CBG in 500s and sonorous breath sounds.   OT comments  Pt seen in conjunction with PT to maximize pts activity tolerance. Pt continues to present with decreased activity tolerance, generalized weakness and impaired balance. Pt able to progress OOB to recliner this session with MAX A +2 and RW to pivot to recliner. Pt with impaired motor planning skills  needing verbal and tactile cues to initiate pivotal steps to recliner. Pt presents with trunk weakness noted to have a  tendency to lean on RW needing tactile cues at chest to elevate trunk during standing. Pt completed additional sit<>stand from chair with MOD A +2and RW; utilized mirror for visual feedback to assist pt with self- correcting trunk positioning d/t pts  impaired proprioception as pt with no awareness to impaired standing balance. Pt now agreeable to SNF placement as pt has steps to enter house and lives with physically disabled roommate ; therefore updated DC plan as indicated below, will let OTR know about change in POC, will continue to follow acutely per POC.    Follow Up Recommendations  SNF    Equipment Recommendations  Other (comment) (TBD)    Recommendations for Other Services      Precautions / Restrictions Precautions Precautions: Fall Restrictions Weight Bearing Restrictions: No       Mobility Bed Mobility Overal bed mobility: Needs Assistance Bed Mobility: Rolling;Sidelying to Sit Rolling: Mod assist;+2 for physical assistance Sidelying to sit: Mod assist;+2 for physical assistance       General bed mobility comments: Bed flat without use of bed rails to  simulate home set-up. Cues provided with L HHA to pull body over to roll to R, with L knee flexed and cues to push through L foot. ModAx2 to initiate and complete roll. ModAx2 with cues to bring legs off EOB and to push up on L elbow > hand to ascend trunk to sit EOB.  Transfers Overall transfer level: Needs assistance Equipment used: Rolling walker (2 wheeled) Transfers: Sit to/from [employer] Sit to Stand: Mod assist;+2 physical assistance Stand pivot transfers: Max assist;+2 physical assistance       General transfer comment: Pt repeatedly attempts to pull up with hands on RW to stand and requires hand-over-hand cues to place on current sitting surface. Pt places feet in narrow stance with them touching and requires tactile and verbal cues to correct. Extra time and modAx2 with tactile cues at chest, hips, and knees to power up to stand. Several side steps to R to stand step transfer from bed to chair with maxAx2 and max step-by-step single step commands to coordinate and sequence movement of legs and weight shifting while maintaining upright posture.    Balance Overall balance assessment: Needs assistance Sitting-balance support: Bilateral upper extremity supported;Feet supported Sitting balance-Leahy Scale: Poor Sitting balance - Comments: pt requried up to MAX A initailly but did progress to min guard for brief bouts of static sitting with use fo mirror as visual feedback Postural control: Posterior lean Standing balance support: Bilateral upper extremity supported Standing balance-Leahy Scale: Zero Standing balance comment: MAX A +2 to maintan upright posture for static standing, pt requires tactile cues to elevate trunk in standing but  once cued able to power up with BUEs                           ADL either performed or assessed with clinical judgement   ADL Overall ADL's : Needs assistance/impaired                         Toilet Transfer:  Maximal assistance;+2 for physical assistance;Stand-pivot;RW Toilet Transfer Details (indicate cue type and reason): MAX A +2 for simulated toilet transfer to recliner with RW         Functional mobility during ADLs: Maximal assistance;+2 for physical assistance;Rolling walker General ADL Comments: pt continues to present with balance deficits, decreased activity tolerance and generalized weakness RLE>LLE     Vision       Perception     Praxis      Cognition Arousal/Alertness: Awake/alert Behavior During Therapy: Flat affect Overall Cognitive Status: No family/caregiver present to determine baseline cognitive functioning                                 General Comments: Pt with flat affect throughout session. Required max verbal and tactile cues throughout all tasks to sequence single step commands to maintain safety. Pt unaware of his deficits and safety concerns.        Exercises     Shoulder Instructions       General Comments Educated pt to perform LAQ and seated marching in recliner throughout day.     Pertinent Vitals/ Pain       Pain Assessment: Faces Faces Pain Scale: Hurts little more Pain Location: legs (muscle soreness) Pain Descriptors / Indicators: Discomfort;Sore Pain Intervention(s): Monitored during session;Repositioned;Limited activity within patient's tolerance  Home Living                                          Prior Functioning/Environment              Frequency  Min 2X/week        Progress Toward Goals  OT Goals(current goals can now be found in the care plan section)  Progress towards OT goals: Progressing toward goals  Acute Rehab OT Goals Patient Stated Goal: to go home OT Goal Formulation: With patient Time For Goal Achievement: 09/21/20 Potential to Achieve Goals: Good  Plan Discharge plan needs to be updated    Co-evaluation      Reason for Co-Treatment: For patient/therapist  safety;To address functional/ADL transfers;Necessary to address cognition/behavior during functional activity PT goals addressed during session: Mobility/safety with mobility;Balance;Proper use of DME OT goals addressed during session: ADL's and self-care      AM-PAC OT "6 Clicks" Daily Activity     Outcome Measure   Help from another person eating meals?: A Little Help from another person taking care of personal grooming?: A Little Help from another person toileting, which includes using toliet, bedpan, or urinal?: A Lot Help from another person bathing (including washing, rinsing, drying)?: A Lot Help from another person to put on and taking off regular upper body clothing?: A Little Help from another person to put on and taking off regular lower body clothing?: A Lot 6 Click Score: 15    End of Session Equipment Utilized During Treatment: Gait belt;Rolling  walker  OT Visit Diagnosis: Other abnormalities of gait and mobility (R26.89);Muscle weakness (generalized) (M62.81);Unsteadiness on feet (R26.81);Other symptoms and signs involving cognitive function   Activity Tolerance Patient tolerated treatment well   Patient Left in chair;with call bell/phone within reach;with chair alarm set   Nurse Communication Mobility status        Time: 2633-3545 OT Time Calculation (min): 20 min  Charges: OT General Charges $OT Visit: 1 Visit  Audery Amel., COTA/L Acute Rehabilitation Services [phone]    Angelina Pih 09/20/2020, 2:04 PM

## 2020-09-20 NOTE — Progress Notes (Addendum)
NT Vicky reported that pt refused to take his v/s,  Explanation was given to him but still refused

## 2020-09-20 NOTE — Progress Notes (Signed)
  Speech Language Pathology Treatment: Dysphagia  Patient Details Name: Mark [name] MRN: [id number] DOB: February 19, 1986 Today's Date: 09/20/2020 Time: 3539-1225 SLP Time Calculation (min) (ACUTE ONLY): 11 min  Assessment / Plan / Recommendation Clinical Impression  Pt consumed regular solids and thin liquids without overt s/s of aspiration, and has been on this diet since 11/23 with stable respiratory status; no acute indicators of infection per chart. He is also less impulsive during PO intake and therefore not needing additional cues for safety once repositioned upright. Pt is also calm and cooperative throughout the session, acknowledging his need for physical assistance and saying again that he agrees with SNF placement as a safer d/c plan given current level of function. Given pt's chronic hydrocephalus, pysch hx, and absence of acute neuro dx, do not think that he needs acute SLP f/u for cognition. He also does not appear to need it for swallowing. SLP will s/o acutely, but would recommend 24/7 supervision at d/c for safety.    HPI HPI: Pt is a 34 yo male admitted with AMS, sonorous respirations, and concern for aspiration in the setting of DKA. Pt was intubated 11/12; self-extubated 11/16. PMH includes: DM type 1, anxiety, bipolar, depression, DM neuropathy, nephrolithiasis, seizures, hydrocephalus      SLP Plan  All goals met       Recommendations  Diet recommendations: Regular;Thin liquid Liquids provided via: Cup;Straw Medication Administration: Whole meds with puree Supervision: Intermittent supervision to cue for compensatory strategies;Patient able to self feed Compensations: Minimize environmental distractions;Slow rate;Small sips/bites Postural Changes and/or Swallow Maneuvers: Seated upright 90 degrees                Oral Care Recommendations: Oral care BID Follow up Recommendations: 24 hour supervision/assistance SLP Visit Diagnosis: Dysphagia, unspecified  (R13.10) Plan: All goals met       GO                Osie Bond., M.A. [place] [facility] Pager [phone] Office [phone]  09/20/2020, 4:09 PM

## 2020-09-20 NOTE — Progress Notes (Signed)
PROGRESS NOTE  Mark [name] ZOX:[id number] DOB: 03/18/1986   PCP: Patient, No Pcp Per  Patient is from: Home.  Reportedly lives with his friend. He says he co-owns the house  DOA: 09/02/2020 LOS: 18  Chief complaints: Altered mental status  Brief Narrative / Interim history: 34 year old male with history of DM-1, anxiety, depression, bipolar disorder, seizure disorder, hydrocephalus and tobacco use disorder brought to ED with altered mental status and found to be in severe DKA with encephalopathy requiring intubation and mechanical ventilation from 11/12-11/14. CT head showed marked hydrocephalus with acute artery duct stenosis and right frontal ventriculostomy site with drain.  Neurosurgery does not recommend any surgical intervention.  He was also treated for aspiration pneumonia with IV ceftriaxone and vancomycin.    Patient is currently on room air and dysphagia 3 diet.  Completed antibiotic course for pneumonia.  Therapy recommended CIR. However, patient has no insurance and does not appear to be a candidate for Medicaid.  He also cannot afford CIR. Patient is maximum assist to get out of the bed.  No family members.  He lives with his friend who is also disabled.  There is no safe disposition at this time.  Now therapy recommending SNF but he is refusing SNF to go home.  Although he previously demonstrated medical decision-making capacity, he seems to have no insight today.  He is oriented x4 but could not tell me why he is in the hospital. He says he will learn to walk when he goes home but doesn't want to learn it here.  He says "nothing" when I asked him what could potentially happen if he goes home.  He says "I will get out" when I told him he could fall.  Again, he states "nothing" what could potentially happen to him after fall once he goes home.  I have consulted psychiatry to reassess his capacity given his underlying psych issues.   Subjective: Seen and examined earlier this  morning.  No major events overnight of this morning.  Continues to endorse insomnia despite high-dose Seroquel and addition of melatonin.  Lengthy discussion about disposition as above but he does not seem to have medical decision capacity today.  Objective: Vitals:   09/19/20 1647 09/19/20 2019 09/19/20 2323 09/20/20 0833  BP: 112/72 115/69 114/73 104/74  Pulse: 76 75 70 70  Resp: 16 18 18 16   Temp: 98.6 F (37 C) 98.6 F (37 C) 98.1 F (36.7 C) 98 F (36.7 C)  TempSrc: Oral Oral Oral Oral  SpO2: 100% 97% 98% 98%  Weight:      Height:        Intake/Output Summary (Last 24 hours) at 09/20/2020 1301 Last data filed at 09/20/2020 0600 Gross per 24 hour  Intake 500 ml  Output 1000 ml  Net -500 ml   Filed Weights   09/07/20 0500 09/08/20 0500 09/09/20 0453  Weight: 103 kg 102.2 kg 97.4 kg    Examination:.   GENERAL: No apparent distress.  Nontoxic. HEENT: MMM.  Vision and hearing grossly intact.  NECK: Supple.  No apparent JVD.  RESP:  No IWOB.  Fair aeration bilaterally. CVS:  RRR. Heart sounds normal.  ABD/GI/GU: BS+. Abd soft, NTND.  MSK/EXT:  Moves extremities. No apparent deformity. No edema.  SKIN: no apparent skin lesion or wound NEURO: Awake, alert and oriented x4.  No new focal neuro deficit other than known mild BLE weakness PSYCH: Calm.  Flat affect.  Poor insight.  Procedures:  EKG 11/12-11/14  Microbiology summarized: COVID-19, influenza and RSV PCR nonreactive. MRSA PCR negative. Blood cultures NGTD. Respiratory culture normal respiratory flora.  Assessment & Plan: Ambulatory dysfunction, deconditioning, debility: Patient is currently maximum assist even to get out of the bed.  Per patient and patient's friend, he was able to ambulate independently for most part prior to this hospitalization. His neuro exam appears intact except for bilateral lower extremity weakness (4/5).  His proprioception is off.  CK and vitamin B12 within normal.  TSH was elevated  likely due to noncompliance with his Synthroid.  Therapy recommended CIR but he has no insurance.  Reportedly not a candidate for Medicaid.  Also cannot afford CIR. Lives with a friend who is disabled to take care of the patient.  No safe disposition at this time. -Continue therapy -Therapy recommended SNF but he is refusing.  Does not seem to have capacity on my evaluation today -Psych reconsulted to reassess capacity.  Acute hypoxic respiratory failure with compromised airway and aspiration pneumonitis: Resolved. -S/p intubation and mechanical ventilation 11/12-11/14.  Currently on room air. -Completed antibiotic course with Rocephin and vancomycin. -Aspiration precautions  Uncontrolled DM-1 with hypo and hyperglycemia: A1c 8.2%.  DKA resolved.  Labile CBG Recent Labs  Lab 09/19/20 0628 09/19/20 1238 09/19/20 1650 09/19/20 2116 09/20/20 0910  GLUCAP 228* 292* 241* 393* 122*  -Continue Novolin N to 25 units twice daily -Continue SSI-sensitive -Further adjustment as appropriate  Acute metabolic encephalopathy: Likely due to the above.  Seems to have resolved but poor insight. -Reorientation and delirium precautions  Anxiety/depression/bipolar disorder/insomnia: He endorsed SI previously and evaluated by psych on 09/11/2020.  Psychiatry did not think he is an imminent risk to self or others.  He now refuses care intermittently and also SNF.  He demonstrated medical decision making capacity previously but not today. -Continue Seroquel, Depakote and Lexapro -IM Haldol as needed for agitation. -Discussed sleep hygiene about insomnia. -Continue melatonin. -Psych consult to reassess capacity  Chronic hydrocephalus: CT head showed marked hydrocephalus with acute artery duct stenosis and right frontal ventriculostomy site with drain. Neurosurgeon does not recommend any surgical intervention.    Dysphagia following intubation: SLP recommended dysphagia 3 diet. -Continue dysphagia-3 diet  and SLP evaluation -Continue Protonix.  Hypokalemia, hypomagnesemia, hypophosphatemia: seems to have resolved but no recent labs. -Monitor intermittently  Anemia ofchronic disease: H&H stable. -Monitor intermittently  Homelessness? patient lives with his friend [name].  They co-own the house.   History of hyperlipidemia  -Continue Lipitor  Hypothyroidism: TSH was elevated at 14.4. Possibility of noncompliance.  -Continue Synthroid 50 mcg daily -Recheck TSH in 3 to 4 weeks  Noncompliance  Body mass index is 27.57 kg/m. Nutrition Problem: Inadequate oral intake Etiology: inability to eat Signs/Symptoms: NPO status Interventions: Refer to RD note for recommendations   DVT prophylaxis:  SCDs Start: 09/02/20 0844  Code Status: Full code Family Communication: Patient and RN.  None at bedside. Status is: Inpatient  Remains inpatient appropriate because:Unsafe d/c plan   Dispo: The patient is from: Home              Anticipated d/c is to: SNF              Anticipated d/c date is: > 3 days              Patient currently is medically stable to d/c.       Consultants:  Neurosurgery-signed off PCCM-signed off Psychiatry   Sch Meds:  Scheduled Meds: . atorvastatin  20 mg Oral  Daily  . Chlorhexidine Gluconate Cloth  6 each Topical Daily  . divalproex  500 mg Oral Daily  . divalproex  750 mg Oral QPM  . escitalopram  20 mg Oral Daily  . feeding supplement (GLUCERNA SHAKE)  237 mL Oral TID BM  . insulin aspart  0-5 Units Subcutaneous QHS  . insulin aspart  0-9 Units Subcutaneous TID WC  . insulin NPH Human  25 Units Subcutaneous BID AC & HS  . levothyroxine  50 mcg Oral Q0600  . melatonin  6 mg Oral QHS  . pantoprazole  40 mg Oral Q1200  . vitamin B-6  50 mg Oral Daily  . QUEtiapine  300 mg Oral QHS   Continuous Infusions: PRN Meds:.acetaminophen, docusate, haloperidol lactate, polyethylene glycol  Antimicrobials: Anti-infectives (From admission,  onward)   Start     Dose/Rate Route Frequency Ordered Stop   09/03/20 0945  cefTRIAXone (ROCEPHIN) 2 g in sodium chloride 0.9 % 100 mL IVPB        2 g 200 mL/hr over 30 Minutes Intravenous Every 24 hours 09/03/20 0854 09/06/20 1126   09/03/20 0800  ceFEPIme (MAXIPIME) 2 g in sodium chloride 0.9 % 100 mL IVPB  Status:  Discontinued        2 g 200 mL/hr over 30 Minutes Intravenous Every 24 hours 09/02/20 0742 09/02/20 1331   09/02/20 2200  vancomycin (VANCOREADY) IVPB 750 mg/150 mL  Status:  Discontinued        750 mg 150 mL/hr over 60 Minutes Intravenous Every 12 hours 09/02/20 1329 09/03/20 0854   09/02/20 1800  ceFEPIme (MAXIPIME) 2 g in sodium chloride 0.9 % 100 mL IVPB  Status:  Discontinued        2 g 200 mL/hr over 30 Minutes Intravenous Every 8 hours 09/02/20 1331 09/03/20 0854   09/02/20 0745  ceFEPIme (MAXIPIME) 2 g in sodium chloride 0.9 % 100 mL IVPB        2 g 200 mL/hr over 30 Minutes Intravenous  Once 09/02/20 0732 09/02/20 0932   09/02/20 0745  vancomycin (VANCOREADY) IVPB 2000 mg/400 mL        2,000 mg 200 mL/hr over 120 Minutes Intravenous  Once 09/02/20 0733 09/02/20 1257   09/02/20 0741  vancomycin variable dose per unstable renal function (pharmacist dosing)  Status:  Discontinued         Does not apply See admin instructions 09/02/20 0742 09/02/20 1329   09/02/20 0730  aztreonam (AZACTAM) 2 g in sodium chloride 0.9 % 100 mL IVPB  Status:  Discontinued        2 g 200 mL/hr over 30 Minutes Intravenous  Once 09/02/20 0728 09/02/20 0732   09/02/20 0730  metroNIDAZOLE (FLAGYL) IVPB 500 mg        500 mg 100 mL/hr over 60 Minutes Intravenous  Once 09/02/20 0728 09/02/20 1040   09/02/20 0730  vancomycin (VANCOCIN) IVPB 1000 mg/200 mL premix  Status:  Discontinued        1,000 mg 200 mL/hr over 60 Minutes Intravenous  Once 09/02/20 0728 09/02/20 0733       I have personally reviewed the following labs and images: CBC: No results for input(s): WBC, NEUTROABS, HGB, HCT,  MCV, PLT in the last 168 hours. BMP &GFR No results for input(s): NA, K, CL, CO2, GLUCOSE, BUN, CREATININE, CALCIUM, MG, PHOS in the last 168 hours.  Invalid input(s):  GFRCG Estimated Creatinine Clearance: 126.3 mL/min (by C-G formula based on SCr of 0.94 mg/dL).  Liver & Pancreas: No results for input(s): AST, ALT, ALKPHOS, BILITOT, PROT, ALBUMIN in the last 168 hours. No results for input(s): LIPASE, AMYLASE in the last 168 hours. No results for input(s): AMMONIA in the last 168 hours. Diabetic: No results for input(s): HGBA1C in the last 72 hours. Recent Labs  Lab 09/19/20 0628 09/19/20 1238 09/19/20 1650 09/19/20 2116 09/20/20 0910  GLUCAP 228* 292* 241* 393* 122*   Cardiac Enzymes: Recent Labs  Lab 09/15/20 0923  CKTOTAL 133   No results for input(s): PROBNP in the last 8760 hours. Coagulation Profile: No results for input(s): INR, PROTIME in the last 168 hours. Thyroid Function Tests: No results for input(s): TSH, T4TOTAL, FREET4, T3FREE, THYROIDAB in the last 72 hours. Lipid Profile: No results for input(s): CHOL, HDL, LDLCALC, TRIG, CHOLHDL, LDLDIRECT in the last 72 hours. Anemia Panel: No results for input(s): VITAMINB12, FOLATE, FERRITIN, TIBC, IRON, RETICCTPCT in the last 72 hours. Urine analysis:    Component Value Date/Time   COLORURINE YELLOW 09/02/2020 0616   APPEARANCEUR HAZY (A) 09/02/2020 0616   APPEARANCEUR Clear 01/20/2015 2136   LABSPEC 1.013 09/02/2020 0616   LABSPEC 1.023 01/20/2015 2136   PHURINE 5.0 09/02/2020 0616   GLUCOSEU >=500 (A) 09/02/2020 0616   GLUCOSEU >=500 01/20/2015 2136   HGBUR SMALL (A) 09/02/2020 0616   BILIRUBINUR NEGATIVE 09/02/2020 0616   BILIRUBINUR Negative 01/20/2015 2136   KETONESUR 5 (A) 09/02/2020 0616   PROTEINUR 30 (A) 09/02/2020 0616   NITRITE NEGATIVE 09/02/2020 0616   LEUKOCYTESUR NEGATIVE 09/02/2020 0616   LEUKOCYTESUR Negative 01/20/2015 2136   Sepsis Labs: Invalid input(s): PROCALCITONIN,  LACTICIDVEN  Microbiology: No results found for this or any previous visit (from the past 240 hour(s)).  Radiology Studies: No results found.   Elisheba Mcdonnell T. Marlyn Rabine [facility]  If 7PM-7AM, please contact night-coverage [email or web address] 09/20/2020, 1:01 PM

## 2020-09-20 NOTE — Progress Notes (Signed)
Physical Therapy Treatment Patient Details Name: Mark [name] MRN: [id number] DOB: May 04, 1986 Today's Date: 09/20/2020    History of Present Illness 34 year old male with history of seizures on Depakote, type 1 diabetes with frequent episodes of DKA, hydrocephalus who presents to the emergency department on 11/12 with altered mental status with CBG in 500s and sonorous breath sounds.    PT Comments    Educated pt on concerns of returning home without following up at a rehab center first, with pt ultimately agreeing to receiving therapy services in the SNF setting prior to return home. Treated pt in conjunction with OT this date to maximize safety and quality of session. Pt demonstrates significant deficits in his ability to sequence tasks or problem-solve how to perform any functional mobility. He required modAx2 for all bed mobility, modAx2 to come to stand, and maxAx2 with use of a RW to take steps to the R to transfer to the recliner or to maintain static standing balance along with max tactile and verbal cues. Also, provided visual cues through use of a mirror placed anterior to pt to facilitate midline orientation and improved balance with upright postures, min success. Pt displays fatigue with all upright postures, resulting in him requiring repeated cues to initiate gluteus maximus and quadriceps muscle activation and push through arms on RW to obtain and maintain full upright posture, with momentary success as he would flex as he fatigued. Will continue to follow acutely. Current recommendations remain appropriate. Goals have be re-assessed and updated as appropriate.   Follow Up Recommendations  Supervision/Assistance - 24 hour;SNF     Equipment Recommendations  Rolling walker with 5" wheels;3in1 (PT);Wheelchair (measurements PT);Wheelchair cushion (measurements PT);Other (comment) (ramp)    Recommendations for Other Services       Precautions / Restrictions  Precautions Precautions: Fall Restrictions Weight Bearing Restrictions: No    Mobility  Bed Mobility Overal bed mobility: Needs Assistance Bed Mobility: Rolling;Sidelying to Sit Rolling: Mod assist;+2 for physical assistance Sidelying to sit: Mod assist;+2 for physical assistance       General bed mobility comments: Bed flat without use of bed rails to simulate home set-up. Cues provided with L HHA to pull body over to roll to R, with L knee flexed and cues to push through L foot. ModAx2 to initiate and complete roll. ModAx2 with cues to bring legs off EOB and to push up on L elbow > hand to ascend trunk to sit EOB.  Transfers Overall transfer level: Needs assistance Equipment used: Rolling walker (2 wheeled) Transfers: Sit to/from [employer] Sit to Stand: Mod assist;+2 physical assistance Stand pivot transfers: Max assist;+2 physical assistance       General transfer comment: Pt repeatedly attempts to pull up with hands on RW to stand and requires hand-over-hand cues to place on current sitting surface. Pt places feet in narrow stance with them touching and requires tactile and verbal cues to correct. Extra time and modAx2 with tactile cues at chest, hips, and knees to power up to stand. Several side steps to R to stand step transfer from bed to chair with maxAx2 and max step-by-step single step commands to coordinate and sequence movement of legs and weight shifting while maintaining upright posture.  Ambulation/Gait Ambulation/Gait assistance: Max assist;+2 physical assistance   Assistive device: Rolling walker (2 wheeled) Gait Pattern/deviations: Decreased dorsiflexion - right;Ataxic;Trunk flexed;Narrow base of support;Decreased weight shift to right;Decreased weight shift to left;Decreased stride length;Step-to pattern Gait velocity: reduced Gait velocity interpretation: <1.31 ft/sec, indicative  of household ambulator General Gait Details: Max cues tactile and  verbal to obtain and maintain upright posture through pushing through hands on RW as pt would flex at trunk and knees as time in standing increased, indicating fatigue. Max cues to sequence weight shifting and then movement of contralateral leg while blocking R knee in stance.   Stairs             Wheelchair Mobility    Modified Rankin (Stroke Patients Only)       Balance Overall balance assessment: Needs assistance Sitting-balance support: Bilateral upper extremity supported;Feet supported Sitting balance-Leahy Scale: Poor Sitting balance - Comments: Posterior lean and bilat UE support, cuing pt to find and maintain upright midline position with min success when pt was able to visualize posture through use of mirror. Progressed from maxA to minA-min guard assist for brief bouts of static sitting in chair without back support or on EOB. Postural control: Posterior lean Standing balance support: Bilateral upper extremity supported Standing balance-Leahy Scale: Zero Standing balance comment: MaxAx2 and bilat UE support on RW with cues at bilat quads and pelvis to extend hips and knees. Repeated cues to improve posture as pt tends to flex at trunk and knees as he fatigues. Mirror placed in front of pt during standing bout lasting ~1 min with pt minimally correcting posture.                             Cognition Arousal/Alertness: Awake/alert Behavior During Therapy: Flat affect Overall Cognitive Status: No family/caregiver present to determine baseline cognitive functioning                                 General Comments: Pt with flat affect throughout session. Required max verbal and tactile cues throughout all tasks to sequence single step commands to maintain safety. Pt unaware of his deficits and safety concerns.      Exercises      General Comments General comments (skin integrity, edema, etc.): Educated pt to perform LAQ and seated marching in  recliner throughout day.       Pertinent Vitals/Pain Pain Assessment: Faces Faces Pain Scale: Hurts little more Pain Location: legs (muscle soreness) Pain Descriptors / Indicators: Discomfort;Sore Pain Intervention(s): Limited activity within patient's tolerance;Monitored during session;Repositioned    Home Living                      Prior Function            PT Goals (current goals can now be found in the care plan section) Acute Rehab PT Goals Patient Stated Goal: to go home PT Goal Formulation: With patient Time For Goal Achievement: 10/07/20 Potential to Achieve Goals: Fair Progress towards PT goals: Progressing toward goals    Frequency    Min 4X/week      PT Plan Current plan remains appropriate    Co-evaluation PT/OT/SLP Co-Evaluation/Treatment: Yes Reason for Co-Treatment: Necessary to address cognition/behavior during functional activity;For patient/therapist safety;To address functional/ADL transfers PT goals addressed during session: Mobility/safety with mobility;Balance;Proper use of DME        AM-PAC PT "6 Clicks" Mobility   Outcome Measure  Help needed turning from your back to your side while in a flat bed without using bedrails?: A Lot Help needed moving from lying on your back to sitting on the side of a flat bed  without using bedrails?: A Lot Help needed moving to and from a bed to a chair (including a wheelchair)?: A Lot Help needed standing up from a chair using your arms (e.g., wheelchair or bedside chair)?: A Lot Help needed to walk in hospital room?: Total Help needed climbing 3-5 steps with a railing? : Total 6 Click Score: 10    End of Session Equipment Utilized During Treatment: Gait belt Activity Tolerance: Patient limited by fatigue Patient left: with call bell/phone within reach;in chair;with chair alarm set   PT Visit Diagnosis: Other abnormalities of gait and mobility (R26.89);Muscle weakness (generalized)  (M62.81);Unsteadiness on feet (R26.81);Difficulty in walking, not elsewhere classified (R26.2)     Time: 1041-1101 PT Time Calculation (min) (ACUTE ONLY): 20 min  Charges:  $Neuromuscular Re-education: 8-22 mins                     [name], PT, DPT Acute Rehabilitation Services  Pager: [phone] Office: [phone]    [name] 09/20/2020, 11:24 AM

## 2020-09-20 NOTE — Consult Note (Signed)
[facility] Face-to-Face Psychiatry Consult   Reason for Consult: Seems he has no insight at all today. He is oriented x4 but no insight to why he is in the hospital. He says he would learn to walk when he goes home and doesn't want to learn here. He also says "nothing" when I asked him what could happen if he goes home. He says "he can get up" if he falls. He says "nothing" when asked about what could potentially happen after fall. I think he is lacking capacity at this time but want your opinion given his underlying psych issue. Thanks! Referring Physician: Dr. Alanda Slim  Patient Identification: Mark [name] MRN:  [id number] Principal Diagnosis: AMS (altered mental status) Diagnosis:  Principal Problem:   AMS (altered mental status) Active Problems:   Diabetic keto-acidosis (HCC)   Obstructive hydrocephalus (HCC)   Bipolar I disorder, most recent episode depressed, severe without psychotic features (HCC)   Total Time spent with patient: 15 minutes  Subjective:   JACQUEL [name] is a 34 y.o. male once evaluated by this nurse practitioner.  Patient is alert and oriented, he is observed to be eating lunch unassisted sitting up in his recliner. Patient immediately begins to express interest of watning to go home, and that he can complete his own recovery at home. He states he has 3-4 steps to enter his home, however chart review shows 12 steps. He also states he has a room mate "[name] is willing to help me with anything. " Patient then asked what will happen if [name] falls while assisting him? " well that wont happen but if it did we could call the ambulance.  Patient unable to identify risks associated with falling at home, he is unable to identify his reason for being in the hospital, or how long he is going to need skilled rehabilitation. He denies any current or active suicidal ideation.  He denies any homicidal ideation and or hallucinations.  He answers all questions appropriately, and does not appear  to be responding to internal stimuli.    HPI:  Per admission assessment note: Patient is a 34 year old male with history of seizures on Depakote, type 1 diabetes with frequent episodes of DKA, hydrocephalus who presents to the emergency department with altered mental status.  Per EMS, patient's roommate called 911.  Reportedly patient was found unresponsive just prior to arrival.  Last seen normal at 3 AM.  Blood glucose in the 500s with EMS.  Patient tachycardic, tachypneic with rhonchorous breath sounds.  Currently on a nonrebreather with a right NPA in place.  No known history of trauma.  Roommate denies history of drug or alcohol abuse.   Psych consult was placed for mental capacity, as patient insight and judgement continue to fluctuate throughout this hospital admission. In terms of decision making capacity, patient lacks insight, and is unable to link causal relationships. This is evident by his inability to weigh his current physical limitations, roommates limitation and need for physical therapy services. Therefore he lacks capacity as he is unable to appreciate the effects of treatment and his need for ongoing services.   Past Psychiatric History: Depression.  Patient appears to be a poor historian unable to recall his current psychiatric medications.  Of note he was on Depakote 500 mg p.o. every morning Depakote 750 mg p.o. nightly, Lexapro 20 mg, and quetiapine 300 mg nightly.  He reports history of suicide attempt.  His [facility].  He admits to not following up with psychiatric outpatient services.  Risk to Self:  Denies denies Risk to Others:  Denies Prior Inpatient Therapy:  6 [facility] ago [facility] Prior Outpatient Therapy:  Denies  Past Medical History:  Past Medical History:  Diagnosis Date  . Anxiety   . Bipolar disorder (HCC)   . Depression   . Diabetes mellitus without complication (HCC)   .  Diabetic polyneuropathy associated with type 1 diabetes mellitus (HCC)   . Hydrocephalus (HCC)   . Kidney stones   . Seizures (HCC)     Past Surgical History:  Procedure Laterality Date  . KIDNEY STONE SURGERY     Family History:  Family History  Problem Relation Age of Onset  . Breast cancer Mother    Family Psychiatric  History: Denies Social History:  Social History   Substance and Sexual Activity  Alcohol Use Yes   Comment: occassional      Social History   Substance and Sexual Activity  Drug Use Yes  . Frequency: 7.0 times per week  . Types: Marijuana   Comment: smoked 2-3 days ago, usually smokes daily    Social History   Socioeconomic History  . Marital status: Single    Spouse name: Not on file  . Number of children: Not on file  . Years of education: Not on file  . Highest education level: Not on file  Occupational History  . Not on file  Tobacco Use  . Smoking status: Current Every Day Smoker    Packs/day: 1.00    Years: 20.00    Pack years: 20.00    Types: Cigarettes  . Smokeless tobacco: Never Used  Vaping Use  . Vaping Use: Never used  Substance and Sexual Activity  . Alcohol use: Yes    Comment: occassional   . Drug use: Yes    Frequency: 7.0 times per week    Types: Marijuana    Comment: smoked 2-3 days ago, usually smokes daily  . Sexual activity: Not Currently  Other Topics Concern  . Not on file  Social History Narrative  . Not on file   Social Determinants of Health   Financial Resource Strain:   . Difficulty of Paying Living Expenses: Not on file  Food Insecurity:   . Worried About Programme researcher, broadcasting/film/videounning Out of Food in the Last Year: Not on file  . Ran Out of Food in the Last Year: Not on file  Transportation Needs:   . Lack of Transportation (Medical): Not on file  . Lack of Transportation (Non-Medical): Not on file  Physical Activity:   . Days of Exercise per Week: Not on file  . Minutes of Exercise per Session: Not on file  Stress:    . Feeling of Stress : Not on file  Social Connections:   . Frequency of Communication with Friends and Family: Not on file  . Frequency of Social Gatherings with Friends and Family: Not on file  . Attends Religious Services: Not on file  . Active Member of Clubs or Organizations: Not on file  . Attends BankerClub or Organization Meetings: Not on file  . Marital Status: Not on file   Additional Social History:    Allergies:   Allergies  Allergen Reactions  . Mushroom Extract Complex Hives, Itching and Nausea And Vomiting  . Penicillins Anaphylaxis, Hives and Swelling    Has patient had a PCN reaction causing immediate rash, facial/tongue/throat swelling, SOB or lightheadedness with hypotension: Yes  Has patient had a PCN reaction causing severe rash involving mucus membranes or skin necrosis: No Has patient had a PCN reaction that required hospitalization: Yes Has patient had a PCN reaction occurring within the last 10 years: Yes If all of the above answers are "NO", then may proceed with Cephalosporin use. Tolerated ceftriaxone 03/24/20  . Sulfa Antibiotics Anaphylaxis and Hives  . Clindamycin/Lincomycin Hives    Labs:  Results for orders placed or performed during the hospital encounter of 09/02/20 (from the past 48 hour(s))  Glucose, capillary     Status: Abnormal   Collection Time: 09/18/20  4:23 PM  Result Value Ref Range   Glucose-Capillary 340 (H) 70 - 99 mg/dL    Comment: Glucose reference range applies only to samples taken after fasting for at least 8 hours.  Glucose, capillary     Status: Abnormal   Collection Time: 09/18/20  9:25 PM  Result Value Ref Range   Glucose-Capillary 435 (H) 70 - 99 mg/dL    Comment: Glucose reference range applies only to samples taken after fasting for at least 8 hours.   Comment 1 Notify RN    Comment 2 Document in Chart   Glucose, capillary     Status: Abnormal   Collection Time: 09/19/20  6:28 AM  Result Value Ref Range    Glucose-Capillary 228 (H) 70 - 99 mg/dL    Comment: Glucose reference range applies only to samples taken after fasting for at least 8 hours.   Comment 1 Notify RN    Comment 2 Document in Chart   Glucose, capillary     Status: Abnormal   Collection Time: 09/19/20 12:38 PM  Result Value Ref Range   Glucose-Capillary 292 (H) 70 - 99 mg/dL    Comment: Glucose reference range applies only to samples taken after fasting for at least 8 hours.  Glucose, capillary     Status: Abnormal   Collection Time: 09/19/20  4:50 PM  Result Value Ref Range   Glucose-Capillary 241 (H) 70 - 99 mg/dL    Comment: Glucose reference range applies only to samples taken after fasting for at least 8 hours.  Glucose, capillary     Status: Abnormal   Collection Time: 09/19/20  9:16 PM  Result Value Ref Range   Glucose-Capillary 393 (H) 70 - 99 mg/dL    Comment: Glucose reference range applies only to samples taken after fasting for at least 8 hours.  Glucose, capillary     Status: Abnormal   Collection Time: 09/20/20  9:10 AM  Result Value Ref Range   Glucose-Capillary 122 (H) 70 - 99 mg/dL    Comment: Glucose reference range applies only to samples taken after fasting for at least 8 hours.    Current Facility-Administered Medications  Medication Dose Route Frequency Provider Last Rate Last Admin  . acetaminophen (TYLENOL) tablet 650 mg  650 mg Oral Q6H PRN Pokhrel, Laxman, MD      . atorvastatin (LIPITOR) tablet 20 mg  20 mg Oral Daily Pokhrel, Laxman, MD   20 mg at 09/20/20 0930  . Chlorhexidine Gluconate Cloth 2 % PADS 6 each  6 each Topical Daily Lynnell Catalan, MD   6 each at 09/17/20 1127  . divalproex (DEPAKOTE) DR tablet 500 mg  500 mg Oral Daily Agarwala, Ravi, MD   500 mg at 09/20/20 0930  . divalproex (DEPAKOTE) DR tablet 750 mg  750 mg Oral QPM Agarwala, Ravi, MD   750 mg at 09/19/20 1706  .  docusate (COLACE) 50 MG/5ML liquid 100 mg  100 mg Oral Daily PRN Pokhrel, Laxman, MD      . escitalopram  (LEXAPRO) tablet 20 mg  20 mg Oral Daily Agarwala, Ravi, MD   20 mg at 09/20/20 0930  . feeding supplement (GLUCERNA SHAKE) (GLUCERNA SHAKE) liquid 237 mL  237 mL Oral TID BM Gonfa, Taye T, MD   237 mL at 09/20/20 1139  . haloperidol lactate (HALDOL) injection 5 mg  5 mg Intramuscular Q6H PRN Lurene Shadow, MD   5 mg at 09/12/20 1305  . insulin aspart (novoLOG) injection 0-5 Units  0-5 Units Subcutaneous QHS Almon Hercules, MD   5 Units at 09/19/20 2201  . insulin aspart (novoLOG) injection 0-9 Units  0-9 Units Subcutaneous TID WC Almon Hercules, MD   1 Units at 09/20/20 0930  . insulin NPH Human (NOVOLIN N) injection 25 Units  25 Units Subcutaneous BID AC & HS Almon Hercules, MD   25 Units at 09/20/20 0931  . levothyroxine (SYNTHROID) tablet 50 mcg  50 mcg Oral Q0600 Joycelyn Das, MD   50 mcg at 09/20/20 0518  . melatonin tablet 6 mg  6 mg Oral QHS Candelaria Stagers T, MD   6 mg at 09/19/20 2024  . pantoprazole (PROTONIX) EC tablet 40 mg  40 mg Oral Q1200 Pokhrel, Laxman, MD   40 mg at 09/19/20 1244  . polyethylene glycol (MIRALAX / GLYCOLAX) packet 17 g  17 g Oral Daily PRN Pokhrel, Laxman, MD      . pyridOXINE (VITAMIN B-6) tablet 50 mg  50 mg Oral Daily Gonfa, Taye T, MD   50 mg at 09/20/20 0930  . QUEtiapine (SEROQUEL) tablet 300 mg  300 mg Oral QHS Opyd, Lavone Neri, MD   300 mg at 09/19/20 2159    Musculoskeletal: Strength & Muscle Tone: within normal limits Gait & Station:  seen resting in bed  Patient leans: N/A  Psychiatric Specialty Exam: Physical Exam Vitals reviewed.  Neurological:     Mental Status: He is alert.  Psychiatric:        Attention and Perception: Attention normal.        Mood and Affect: Mood normal.        Behavior: Behavior normal. Behavior is cooperative.        Thought Content: Thought content includes suicidal ideation. Thought content does not include suicidal plan.        Cognition and Memory: Cognition normal.     Review of Systems  Psychiatric/Behavioral:  Negative for behavioral problems, decreased concentration and hallucinations. The patient is nervous/anxious.   All other systems reviewed and are negative.   Blood pressure 104/74, pulse 70, temperature 98 F (36.7 C), temperature source Oral, resp. rate 16, height 6\' 2"  (1.88 m), weight 97.4 kg, SpO2 98 %.Body mass index is 27.57 kg/m.  General Appearance: Disheveled and Guarded and flat   Eye Contact:  Good  Speech:  Clear and Coherent  Volume:  Normal  Mood:  Depressed  Affect:  Congruent  Thought Process:  Coherent  Orientation:  Full (Time, Place, and Person)  Thought Content:  Logical  Suicidal Thoughts:  No, contracts for safety  Homicidal Thoughts:  No  Memory:  Immediate;   Fair Recent;   Fair  Judgement:  Fair  Insight:  Fair  Psychomotor Activity:  Normal  Concentration:  Concentration: Fair  Recall:  of Knowledge:  Fair  Language:  Fair  Akathisia:  No  Handed:  Right  AIMS (if indicated):     Assets:  Communication Skills Desire for Improvement Social Support  ADL's:  Intact  Cognition:  WNL  Sleep:        Treatment Plan Summary: Plan Continue with current home medications at this time.    -However he lacks insight and judgment regarding his overall recovery and need for ongoing rehabilitation, therefore he does not have the capacity to make medical decisions. At this time recommend contacting ethics or legal next of kin. Patient with underlying mental health issues does impact his ability to have capacity, however he can still make some decisions.    Since completing this capacity evaluation, patient has agreed to skilled nursing facility(see note from OT on 11/30). As noted his capacity and ability to change will continue to fluctuate throughout his stay. Please be aware that capacity is subjective evaluation of functional assessment that can change over a brief period of time, a more objective evaluation is competency that is a global assessment.    Disposition: No evidence of imminent risk to self or others at present.   Patient does not meet criteria for psychiatric inpatient admission. Supportive therapy provided about ongoing stressors. Refer to IOP. Discussed crisis plan, support from social network, calling 911, coming to the Emergency Department, and calling Suicide Hotline.  Maryagnes Amos, FNP 09/20/2020 12:31 PM

## 2020-09-21 DIAGNOSIS — G911 Obstructive hydrocephalus: Secondary | ICD-10-CM

## 2020-09-21 LAB — GLUCOSE, CAPILLARY
Glucose-Capillary: 173 mg/dL — ABNORMAL HIGH (ref 70–99)
Glucose-Capillary: 122 mg/dL — ABNORMAL HIGH (ref 70–99)
Glucose-Capillary: 223 mg/dL — ABNORMAL HIGH (ref 70–99)
Glucose-Capillary: 298 mg/dL — ABNORMAL HIGH (ref 70–99)
Glucose-Capillary: 277 mg/dL — ABNORMAL HIGH (ref 70–99)

## 2020-09-21 NOTE — Progress Notes (Addendum)
PROGRESS NOTE    Mark [name]   UXN:[id number]  DOB: 03-Jun-1986  DOA: 09/02/2020     19  PCP: Patient, No Pcp Per  CC: AMS  Hospital Course: 34 year old male with history of DM-1, anxiety, depression, bipolar disorder, seizure disorder, hydrocephalus and tobacco use disorder brought to ED with altered mental status and found to be in severe DKA with encephalopathy requiring intubation and mechanical ventilation from 11/12-11/14. CT head showed marked hydrocephalus with acute aqueductal stenosis and right frontal ventriculostomy site with drain.  Neurosurgery does not recommend any surgical intervention.  He was also treated for aspiration pneumonia with IV ceftriaxone and vancomycin.     Patient is currently on room air and dysphagia 3 diet.  Completed antibiotic course for pneumonia.  Therapy recommended CIR. However, patient has no insurance and does not appear to be a candidate for Medicaid.  He also cannot afford CIR. Patient is maximum assist to get out of the bed.  No family members.  He lives with his friend who is also disabled.  There is no safe disposition at this time.   Patient initially was refusing SNF and due to his underlying mentation, psychiatry was consulted for evaluating capacity.  As of 09/20/2020 he was not deemed to have capacity for deciding disposition placement. With further conversations, he was then amenable to SNF placement if needed.  Due to his insurance issues however, he will likely continue with rehab inpatient until he is strong enough for safe discharge back home.   Interval History:  No events overnight.  Resting in bed comfortably when seen.  Was still agreeable for rehab if needed and if bed was found.  We also discussed his insurance limitations and financial difficulties.  Essentially we would have to continue inpatient hospitalization with ongoing rehab while he was here until he was considered safe enough for discharging home.  Old records  reviewed in assessment of this patient  ROS: Constitutional: negative for chills and malaise, Respiratory: negative for cough, Cardiovascular: negative for chest pain and Gastrointestinal: negative for abdominal pain  Assessment & Plan: Ambulatory dysfunction, deconditioning, debility: Patient is currently maximum assist even to get out of the bed.  Per patient and patient's friend, he was able to ambulate independently for most part prior to this hospitalization. His neuro exam appears intact except for bilateral lower extremity weakness (4/5).  His proprioception is off.  CK and vitamin B12 within normal.  TSH was elevated likely due to noncompliance with his Synthroid.  Therapy recommended CIR but he has no insurance.  Reportedly not a candidate for Medicaid.  Also cannot afford CIR. Lives with a friend who is disabled to take care of the patient.  No safe disposition at this time. -Continue therapy inpatient - now he is amenable to SNF (still not likely to happen); but psych has evaluated and he does not have capacity at this time which agreed to may change in the future as he further improves -Continue ongoing therapy for now; hopeful that patient can regain enough strength and mobility to return home  Acute hypoxic respiratory failure with compromised airway and aspiration pneumonitis: Resolved. -S/p intubation and mechanical ventilation 11/12-11/14.  Currently on room air. -Completed antibiotic course with Rocephin and vancomycin. -Aspiration precautions  Uncontrolled DM-1 with hypo and hyperglycemia: A1c 8.2%.  DKA resolved.  Labile CBG -Continue SSI and CBGs  Acute metabolic encephalopathy: Likely due to the above.  Seems to have resolved but poor insight. -Reorientation and delirium precautions  Anxiety/depression/bipolar disorder/insomnia: He endorsed SI previously and evaluated by psych on 09/11/2020.  Psychiatry did not think he is an imminent risk to self or others.  He now  refuses care intermittently and also SNF.  He demonstrated medical decision making capacity previously -As of 09/20/2020, psychiatry does not believe patient has capacity.  This will be an ongoing evaluation -Continue Seroquel, Depakote and Lexapro -IM Haldol as needed for agitation. -Discussed sleep hygiene about insomnia. -Continue melatonin.  Chronic hydrocephalus: CT head showed marked hydrocephalus with acute aqueductal stenosis and right frontal ventriculostomy site with drain.Neurosurgeon does not recommend any surgical intervention.   Dysphagia following intubation: SLP recommended dysphagia 3 diet. -Continue dysphagia-3 diet and SLP evaluation -Continue Protonix.  Hypokalemia, hypomagnesemia, hypophosphatemia: seems to have resolved but no recent labs. -Monitor intermittently  Anemia ofchronic disease: H&H stable. -Monitor intermittently  Homelessness? patient lives with his friend [name].  They co-own the house.   History of hyperlipidemia  -Continue Lipitor  Hypothyroidism: TSH was elevated at 14.4. Possibility of noncompliance.  -Continue Synthroid 50 mcg daily -Recheck TSH in 3 to 4 weeks   Antimicrobials:   DVT prophylaxis: SCD Code Status: FULL Family Communication: none present Disposition Plan: Status is: Inpatient  Remains inpatient appropriate because:Unsafe d/c plan and Inpatient level of care appropriate due to severity of illness   Dispo: The patient is from: Home              Anticipated d/c is to: likely home due to lack of insurance and medicaid              Anticipated d/c date is: > 3 days              Patient currently is medically stable to d/c.  Objective: Blood pressure (P) 110/77, pulse (P) 82, temperature (P) 98.1 F (36.7 C), temperature source (P) Oral, resp. rate (P) 18, height 6\' 2"  (1.88 m), weight 97.4 kg, SpO2 (P) 99 %.  Examination: General appearance: Slowed mentation but able to answer all questions and engage in  conversation easily Head: Normocephalic, without obvious abnormality, atraumatic Eyes: EOMI Lungs: clear to auscultation bilaterally Heart: regular rate and rhythm and S1, S2 normal Abdomen: normal findings: bowel sounds normal and soft, non-tender Extremities: No edema Skin: mobility and turgor normal Neurologic: Grossly normal  Consultants:   Neurology  Procedures:     Data Reviewed: I have personally reviewed following labs and imaging studies Results for orders placed or performed during the hospital encounter of 09/02/20 (from the past 24 hour(s))  Glucose, capillary     Status: Abnormal   Collection Time: 09/20/20  3:21 PM  Result Value Ref Range   Glucose-Capillary 352 (H) 70 - 99 mg/dL  Glucose, capillary     Status: Abnormal   Collection Time: 09/20/20  5:10 PM  Result Value Ref Range   Glucose-Capillary 336 (H) 70 - 99 mg/dL  Glucose, capillary     Status: Abnormal   Collection Time: 09/20/20  9:09 PM  Result Value Ref Range   Glucose-Capillary 273 (H) 70 - 99 mg/dL   Comment 1 Notify RN    Comment 2 Document in Chart   Glucose, capillary     Status: Abnormal   Collection Time: 09/21/20  6:45 AM  Result Value Ref Range   Glucose-Capillary 173 (H) 70 - 99 mg/dL   Comment 1 Notify RN    Comment 2 Document in Chart   Glucose, capillary     Status: Abnormal   Collection  Time: 09/21/20 11:45 AM  Result Value Ref Range   Glucose-Capillary 122 (H) 70 - 99 mg/dL    No results found for this or any previous visit (from the past 240 hour(s)).   Radiology Studies: No results found. DG Swallowing Func-Speech Pathology  Final Result    DG Chest [facility] 1 View  Final Result    DG Chest Port 1 View  Final Result    DG Chest Port 1 View  Final Result    DG Chest Portable 1 View  Final Result    CT Head Wo Contrast  Final Result    DG Chest Portable 1 View  Final Result      Scheduled Meds: . atorvastatin  20 mg Oral Daily  . Chlorhexidine Gluconate  Cloth  6 each Topical Daily  . divalproex  500 mg Oral Daily  . divalproex  750 mg Oral QPM  . escitalopram  20 mg Oral Daily  . feeding supplement (GLUCERNA SHAKE)  237 mL Oral TID BM  . insulin aspart  0-5 Units Subcutaneous QHS  . insulin aspart  0-9 Units Subcutaneous TID WC  . insulin NPH Human  25 Units Subcutaneous BID AC & HS  . levothyroxine  50 mcg Oral Q0600  . melatonin  6 mg Oral QHS  . pantoprazole  40 mg Oral Q1200  . vitamin B-6  50 mg Oral Daily  . QUEtiapine  300 mg Oral QHS   PRN Meds: acetaminophen, docusate, haloperidol lactate, polyethylene glycol Continuous Infusions:   LOS: 19 days  Time spent: Greater than 50% of the 35 minute visit was spent in counseling/coordination of care for the patient as laid out in the A&P.   Lewie Chamber, MD [facility] 09/21/2020, 3:10 PM

## 2020-09-21 NOTE — Progress Notes (Signed)
Physical Therapy Treatment Patient Details Name: Mark [name] MRN: [id number] DOB: 1986-08-26 Today's Date: 09/21/2020    History of Present Illness 34 year old male with history of seizures on Depakote, type 1 diabetes with frequent episodes of DKA, hydrocephalus who presents to the emergency department on 11/12 with altered mental status with CBG in 500s and sonorous breath sounds.    PT Comments    Pt progressing slowly towards goals. Improved ability to sequence steps and following cues this date compared to previous sessions. He required modA to sit up EOB with HOB elevated and with use of bed rails. ModAx2 to come to stand with cues to correct feet and hand placement for safety. Pt required maxAx2 with use of a RW to take several steps to the R to transfer from the bed to the recliner, cuing pt to shift his weight then mobilize the contralateral leg. Physical assistance provided for advancement and placement of feet during swing phase of each step. Performed several leg muscular strengthening exercises at end of session, including: 3 mini-squats from the recliner, bilat LAQ 10x, and attempted seated marching. Pt unable to perform adequate hip flexion to lift foot off floor, indicating hip flexor weakness. Fatigue noted and reported with other exercises, indicating appropriate exercise intensity. Will continue to follow acutely. Current recommendations remain appropriate.    Follow Up Recommendations  Supervision/Assistance - 24 hour;SNF     Equipment Recommendations  Rolling walker with 5" wheels;3in1 (PT);Wheelchair (measurements PT);Wheelchair cushion (measurements PT);Other (comment) (ramp)    Recommendations for Other Services       Precautions / Restrictions Precautions Precautions: Fall Restrictions Weight Bearing Restrictions: No    Mobility  Bed Mobility Overal bed mobility: Needs Assistance Bed Mobility: Supine to Sit     Supine to sit: Mod assist;HOB elevated      General bed mobility comments: Cuing pt to bring legs off R EOB with success. Tactile and verbal cues for hands on R bed rail to assist with pulling trunk to sit up. Required increased time to process cue to release R hand from bed rail to place hand on bed to push up to ascend trunk, modA to complete.  Transfers Overall transfer level: Needs assistance Equipment used: Rolling walker (2 wheeled) Transfers: Sit to/from [employer] Sit to Stand: Mod assist;+2 physical assistance Stand pivot transfers: Max assist;+2 physical assistance       General transfer comment: Pt repeatedly attempts to pull up with hands on RW to stand and requires hand-over-hand cues to place on current sitting surface. Pt places feet in narrow stance with them touching and requires tactile and verbal cues to correct. Extra time and modAx2 with tactile cues at chest, hips, and knees to power up to stand. Several side steps to R to stand step transfer from bed to chair with maxAx2 and max step-by-step single step commands to coordinate and sequence movement of legs and weight shifting while maintaining upright posture.  Ambulation/Gait Ambulation/Gait assistance: Max assist;+2 physical assistance Gait Distance (Feet): 3 Feet Assistive device: Rolling walker (2 wheeled) Gait Pattern/deviations: Decreased dorsiflexion - right;Ataxic;Trunk flexed;Narrow base of support;Decreased weight shift to right;Decreased weight shift to left;Decreased stride length;Step-to pattern Gait velocity: reduced Gait velocity interpretation: <1.31 ft/sec, indicative of household ambulator General Gait Details: Max cues tactile and verbal to obtain and maintain upright posture through pushing through hands on RW as pt would flex at trunk and knees as time in standing increased, indicating fatigue. Max cues to sequence weight shifting and then  movement of contralateral leg while blocking R knee in stance.   Stairs              Wheelchair Mobility    Modified Rankin (Stroke Patients Only)       Balance Overall balance assessment: Needs assistance Sitting-balance support: Bilateral upper extremity supported;Feet supported Sitting balance-Leahy Scale: Poor Sitting balance - Comments: Mod-maxA to maintain balance with cues to maintain midline and lean anteriorly without falling forward, min success. Postural control: Posterior lean;Right lateral lean Standing balance support: Bilateral upper extremity supported Standing balance-Leahy Scale: Zero Standing balance comment: MaxAx2 and bilat UE support on RW with cues at bilat quads and pelvis to extend hips and knees. Repeated cues to improve posture as pt tends to flex at trunk and knees as he fatigues.                             Cognition Arousal/Alertness: Awake/alert Behavior During Therapy: Flat affect Overall Cognitive Status: No family/caregiver present to determine baseline cognitive functioning                                 General Comments: Pt with flat affect throughout session. Required max verbal and tactile cues throughout all tasks to sequence single step commands to maintain safety. Pt unaware of his deficits and safety concerns.      Exercises General Exercises - Lower Extremity Long Arc Quad: AROM;Strengthening;Both;10 reps;Seated (reported muscle "burning" indicating appropriate intensity) Hip Flexion/Marching: AROM;Both;Seated;Other (comment) (attempted but unable to clear foot off ground) Mini-Sqauts: Strengthening;Both;Other (comment) (3 reps with use of hands on recliner)    General Comments        Pertinent Vitals/Pain Pain Assessment: Faces Faces Pain Scale: Hurts a little bit Pain Location: lower back Pain Descriptors / Indicators: Discomfort;Sore;Grimacing Pain Intervention(s): Limited activity within patient's tolerance;Monitored during session;Repositioned    Home Living                       Prior Function            PT Goals (current goals can now be found in the care plan section) Acute Rehab PT Goals Patient Stated Goal: to go home PT Goal Formulation: With patient Time For Goal Achievement: 10/07/20 Potential to Achieve Goals: Fair Progress towards PT goals: Progressing toward goals    Frequency    Min 4X/week      PT Plan Current plan remains appropriate    Co-evaluation              AM-PAC PT "6 Clicks" Mobility   Outcome Measure  Help needed turning from your back to your side while in a flat bed without using bedrails?: A Lot Help needed moving from lying on your back to sitting on the side of a flat bed without using bedrails?: A Lot Help needed moving to and from a bed to a chair (including a wheelchair)?: A Lot Help needed standing up from a chair using your arms (e.g., wheelchair or bedside chair)?: A Lot Help needed to walk in hospital room?: Total Help needed climbing 3-5 steps with a railing? : Total 6 Click Score: 10    End of Session Equipment Utilized During Treatment: Gait belt Activity Tolerance: Patient limited by fatigue Patient left: with call bell/phone within reach;in chair;with chair alarm set Nurse Communication: Mobility status PT Visit Diagnosis: Other  abnormalities of gait and mobility (R26.89);Muscle weakness (generalized) (M62.81);Unsteadiness on feet (R26.81);Difficulty in walking, not elsewhere classified (R26.2)     Time: 3532-9924 PT Time Calculation (min) (ACUTE ONLY): 17 min  Charges:  $Therapeutic Activity: 8-22 mins                     [name], PT, DPT Acute Rehabilitation Services  Pager: [phone] Office: [phone]    [name] 09/21/2020, 3:18 PM

## 2020-09-21 NOTE — Hospital Course (Addendum)
34 year old male with history of DM-1, anxiety, depression, bipolar disorder, seizure disorder, hydrocephalus and tobacco use disorder brought to ED with altered mental status and found to be in severe DKA with encephalopathy requiring intubation and mechanical ventilation from 11/12-11/14. CT head showed marked hydrocephalus with acute aqueductal stenosis and right frontal ventriculostomy site with drain.  Neurosurgery does not recommend any surgical intervention.  He was also treated for aspiration pneumonia with IV ceftriaxone and vancomycin.     Patient is currently on room air and dysphagia 3 diet.  Completed antibiotic course for pneumonia.  Therapy recommended CIR. However, patient has no insurance and does not appear to be a candidate for Medicaid.  He also cannot afford CIR. Patient is maximum assist to get out of the bed.  No family members.  He lives with his friend who is also disabled.  There is no safe disposition at this time.   Patient initially was refusing SNF and due to his underlying mentation, psychiatry was consulted for evaluating capacity.  As of 09/20/2020 he was not deemed to have capacity for deciding disposition placement. With further conversations, he was then amenable to SNF placement if needed.  Due to his insurance issues however, he will likely continue with rehab inpatient until he is strong enough for safe discharge back home.

## 2020-09-22 DIAGNOSIS — E1065 Type 1 diabetes mellitus with hyperglycemia: Secondary | ICD-10-CM

## 2020-09-22 DIAGNOSIS — R262 Difficulty in walking, not elsewhere classified: Secondary | ICD-10-CM

## 2020-09-22 DIAGNOSIS — F319 Bipolar disorder, unspecified: Secondary | ICD-10-CM

## 2020-09-22 DIAGNOSIS — G9341 Metabolic encephalopathy: Secondary | ICD-10-CM

## 2020-09-22 DIAGNOSIS — R5381 Other malaise: Secondary | ICD-10-CM

## 2020-09-22 LAB — GLUCOSE, CAPILLARY
Glucose-Capillary: 164 mg/dL — ABNORMAL HIGH (ref 70–99)
Glucose-Capillary: 68 mg/dL — ABNORMAL LOW (ref 70–99)
Glucose-Capillary: 120 mg/dL — ABNORMAL HIGH (ref 70–99)
Glucose-Capillary: 221 mg/dL — ABNORMAL HIGH (ref 70–99)
Glucose-Capillary: 347 mg/dL — ABNORMAL HIGH (ref 70–99)

## 2020-09-22 NOTE — Plan of Care (Signed)
  Problem: Health Behavior/Discharge Planning: Goal: Ability to manage health-related needs will improve Outcome: Not Progressing   Problem: Clinical Measurements: Goal: Ability to maintain clinical measurements within normal limits will improve Outcome: Not Progressing   Problem: Elimination: Goal: Will not experience complications related to bowel motility Outcome: Not Progressing

## 2020-09-22 NOTE — Progress Notes (Addendum)
[facility] PROGRESS NOTE  ALEXANDROS EWAN SWF:[id number] DOB: [birth year] DOA: 09/02/2020 PCP: Patient, No Pcp Per  Status: Remains inpatient appropriate because:Unsafe d/c plan   Dispo: The patient is from: Home              Anticipated d/c is to: Home (SNF recommended but patient refused)              Anticipated d/c date is: > 3 days              Patient currently is medically stable to d/c.  Barriers to discharge: Patient refusing SNF placement.  Waxing and waning ability to have capacity to make medical decisions.  Very deconditioned and unable to return to home environment due to ambulatory dysfunction.   Code Status: Full Family Communication: Patient DVT prophylaxis: SCDs Vaccination status: No documentation that patient has received Covid vaccination  Foley catheter: No-external male urinary collection device in place   HPI: With history of diabetes type 1, anxiety/depression manifested as bipolar disorder, seizure disorder, hydrocephalus and tobacco use.  He was sent to the ER with altered mental status with seizure like activity and found to be in severe DKA with encephalopathy that required intubation/ventilation from 11/12-11/14.  Subsequent CT head demonstrated persistent marked hydrocephalus assistant with acute aqueductal stenosis.  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  Neurosurgery did not recommend any surgical intervention or placement of new drain.  He was initially treated for aspiration pneumonia with IV Rocephin and vancomycin.  Subsequently patient has been transferred out of the ICU.  PT/OT have recommended CIR but patient does not have insurance.  Medicaid application in process.  SNF recommended for short-term rehab but patient has refused.  He lives with a disabled friend and there are multiple stairs within the home.  Based on current PT and OT evaluations patient is unable to discharge home independently based on  increased mobility needs.  Because of concerns for capacity psychiatry was consulted.  It was documented that his insight and judgment as continued to fluctuate throughout this hospitalization and thus was deemed lacking of capacity to make medical decisions.  He did not meet criteria for inpatient behavioral health treatment though.  Plan is to focus on aggressive PT and OT and allow patient to discharge to home environment once physically stable.  Subjective: Patient is awake and cooperative.  No complaints of pain, shortness of breath, cough, nausea or inability to eat.  Objective: Vitals:   09/22/20 0532 09/22/20 0857  BP: 109/71 109/71  Pulse: 72 75  Resp:  18  Temp: 97.8 F (36.6 C) 98.3 F (36.8 C)  SpO2: 96% 98%    Intake/Output Summary (Last 24 hours) at 09/22/2020 1032 Last data filed at 09/22/2020 0830 Gross per 24 hour  Intake 820 ml  Output 2325 ml  Net -1505 ml   Filed Weights   09/07/20 0500 09/08/20 0500 09/09/20 0453  Weight: 103 kg 102.2 kg 97.4 kg    Exam:  Constitutional: NAD, calm, comfortable Respiratory: clear to auscultation bilaterally, no wheezing, no crackles. Normal respiratory effort. No accessory muscle use.  Cardiovascular: Regular rate and rhythm, no murmurs / rubs / gallops. No extremity edema. 2+ pedal pulses. No carotid bruits.  Abdomen: no tenderness, no masses palpated. No hepatosplenomegaly. Bowel sounds positive.  Musculoskeletal: no clubbing / cyanosis. No joint deformity upper and lower extremities. Good ROM, no contractures. Normal muscle tone.  Skin: no rashes, lesions, ulcers. No induration Neurologic:  CN 2-12 grossly intact. Sensation intact, Strength 5/5 x upper extremities and 4/5 lower extremities extremities.  Psychiatric:  Alert and oriented x 3. Normal mood.  Did not evaluate in detail issues related to capacity.   Assessment/Plan: Acute problems: Severe physical deconditioning with associated ambulatory dysfunction -No  focal neurological deficits only generalized weakness of lower extremities -Continues to require max assist with therapies and at this juncture SNF continues to be recommended by therapy providers.  Patient has multiple stairs in the home he shares with a friend and would need to be able to navigate stairs before can safely discharge home. -It appears patient is currently receiving therapy 4x per week-we will contact therapy providers to see if frequency can be increased given inability to discharge to CIR and patient's refusal for SNF  Acute metabolic encephalopathy/Capacity concerns/bipolar disorder -Presented with acute encephalopathy likely related to DKA hypoxia and aspiration pneumonitis -Continues to have waxing and waning issues regarding capacity per psychiatry team documenting lacks insight and is unable to link because of relationships.  Evidence is based on his inability to weigh current physical limitations, remains limitations and need for physical therapy services. -We will continue Seroquel, Depakote and Lexapro -IM Haldol prn for agitation -Melatonin at HS for insomnia symptoms  Uncontrolled diabetes type 1 with hyperglycemia and presentation with DKA, hypoglycemic episodes since admission -Brittle diabetes -Hemoglobin A1c 8.2 -Continue NPH insulin 25 units BID -Continue SSI -Current CBGs have been between 122 and 298 therefore will allow for degree of mild permissive hyperglycemia  Chronic hydrocephalus/?history of seizure disorder -Persistent changes of hydrocephalus involving the third and lateral ventricles with normal fourth ventricle assistant with aqueductal stenosis -Neurosurgery evaluated during admission and no indications for surgical intervention -There was also a question of possible seizure-like activity at presentation but was evaluated by neurology earlier in the hospitalization who determined he was not actively seizing -Continue Depakote  Dysphagia -Stable  on D3 diet  Nutrition Status: Nutrition Problem: Inadequate oral intake-RESOLVED-now eating 100% of meals Etiology: inability to eat-resolved Signs/Symptoms: NPO status-now on D3 diet with Glucerna supplements, oral vitamin B6 replacement Interventions: Refer to RD note for recommendations     Other problems: Electrolyte abnormalities (Low K/Mg/Phos) -Labs have been stable after patient given replacement -Primarily related to presentation with DKA  Dyslipidemia -Continue Lipitor  Hypothyroidism with elevated TSH -TSH 14.4 (was 18.4 in May) -Noncompliant suspected although could be taking Synthroid with food which will inhibit correct absorption -Continue Synthroid 50 mcg daily -Repeat TSH mid December 2021  Anemia of chronic disease -Hemoglobin has remained stable with last reading 11.8 on 11/21   Data Reviewed: Basic Metabolic Panel: No results for input(s): NA, K, CL, CO2, GLUCOSE, BUN, CREATININE, CALCIUM, MG, PHOS in the last 168 hours. Liver Function Tests: No results for input(s): AST, ALT, ALKPHOS, BILITOT, PROT, ALBUMIN in the last 168 hours. No results for input(s): LIPASE, AMYLASE in the last 168 hours. No results for input(s): AMMONIA in the last 168 hours. CBC: No results for input(s): WBC, NEUTROABS, HGB, HCT, MCV, PLT in the last 168 hours. Cardiac Enzymes: No results for input(s): CKTOTAL, CKMB, CKMBINDEX, TROPONINI in the last 168 hours. BNP (last 3 results) No results for input(s): BNP in the last 8760 hours.  ProBNP (last 3 results) No results for input(s): PROBNP in the last 8760 hours.  CBG: Recent Labs  Lab 09/21/20 1145 09/21/20 1615 09/21/20 1727 09/21/20 2045 09/22/20 0734  GLUCAP 122* 223* 298* 277* 164*    No results found for  this or any previous visit (from the past 240 hour(s)).   Studies: No results found.  Scheduled Meds: . atorvastatin  20 mg Oral Daily  . Chlorhexidine Gluconate Cloth  6 each Topical Daily  . divalproex   500 mg Oral Daily  . divalproex  750 mg Oral QPM  . escitalopram  20 mg Oral Daily  . feeding supplement (GLUCERNA SHAKE)  237 mL Oral TID BM  . insulin aspart  0-5 Units Subcutaneous QHS  . insulin aspart  0-9 Units Subcutaneous TID WC  . insulin NPH Human  25 Units Subcutaneous BID AC & HS  . levothyroxine  50 mcg Oral Q0600  . melatonin  6 mg Oral QHS  . pantoprazole  40 mg Oral Q1200  . vitamin B-6  50 mg Oral Daily  . QUEtiapine  300 mg Oral QHS   Continuous Infusions:  Principal Problem:   AMS (altered mental status) Active Problems:   Diabetic keto-acidosis (HCC)   Obstructive hydrocephalus (HCC)   Bipolar I disorder, most recent episode depressed, severe without psychotic features [facility](HCC)   Consultants: Neurology Neurosurgery PCCM Psychiatry  Procedures:  Intubation/extubation  EEG  MBSS  Antibiotics: Anti-infectives (From admission, onward)   Start     Dose/Rate Route Frequency Ordered Stop   09/03/20 0945  cefTRIAXone (ROCEPHIN) 2 g in sodium chloride 0.9 % 100 mL IVPB        2 g 200 mL/hr over 30 Minutes Intravenous Every 24 hours 09/03/20 0854 09/06/20 1126   09/03/20 0800  ceFEPIme (MAXIPIME) 2 g in sodium chloride 0.9 % 100 mL IVPB  Status:  Discontinued        2 g 200 mL/hr over 30 Minutes Intravenous Every 24 hours 09/02/20 0742 09/02/20 1331   09/02/20 2200  vancomycin (VANCOREADY) IVPB 750 mg/150 mL  Status:  Discontinued        750 mg 150 mL/hr over 60 Minutes Intravenous Every 12 hours 09/02/20 1329 09/03/20 0854   09/02/20 1800  ceFEPIme (MAXIPIME) 2 g in sodium chloride 0.9 % 100 mL IVPB  Status:  Discontinued        2 g 200 mL/hr over 30 Minutes Intravenous Every 8 hours 09/02/20 1331 09/03/20 0854   09/02/20 0745  ceFEPIme (MAXIPIME) 2 g in sodium chloride 0.9 % 100 mL IVPB        2 g 200 mL/hr over 30 Minutes Intravenous  Once 09/02/20 0732 09/02/20 0932   09/02/20 0745  vancomycin (VANCOREADY) IVPB 2000 mg/400 mL        2,000 mg 200  mL/hr over 120 Minutes Intravenous  Once 09/02/20 0733 09/02/20 1257   09/02/20 0741  vancomycin variable dose per unstable renal function (pharmacist dosing)  Status:  Discontinued         Does not apply See admin instructions 09/02/20 0742 09/02/20 1329   09/02/20 0730  aztreonam (AZACTAM) 2 g in sodium chloride 0.9 % 100 mL IVPB  Status:  Discontinued        2 g 200 mL/hr over 30 Minutes Intravenous  Once 09/02/20 0728 09/02/20 0732   09/02/20 0730  metroNIDAZOLE (FLAGYL) IVPB 500 mg        500 mg 100 mL/hr over 60 Minutes Intravenous  Once 09/02/20 0728 09/02/20 1040   09/02/20 0730  vancomycin (VANCOCIN) IVPB 1000 mg/200 mL premix  Status:  Discontinued        1,000 mg 200 mL/hr over 60 Minutes Intravenous  Once 09/02/20 0728 09/02/20 0733  Time spent: 30 minutes    Junious Silk ANP  [facility] Pager [phone].

## 2020-09-22 NOTE — Progress Notes (Signed)
Inpatient Diabetes Program Recommendations  AACE/ADA: New Consensus Statement on Inpatient Glycemic Control (2015)  Target Ranges:  Prepandial:   less than 140 mg/dL      Peak postprandial:   less than 180 mg/dL (1-2 hours)      Critically ill patients:  140 - 180 mg/dL   Lab Results  Component Value Date   GLUCAP 164 (H) 09/22/2020   HGBA1C 7.5 (H) 09/15/2020    Review of Glycemic Control Results for Mark [name], Mark [name] (MRN [id number]) as of 09/22/2020 11:59  Ref. Range 09/21/2020 11:45 09/21/2020 16:15 09/21/2020 17:27 09/21/2020 20:45 09/22/2020 07:34  Glucose-Capillary Latest Ref Range: 70 - 99 mg/dL 626 (H) 948 (H) 546 (H) 277 (H) 164 (H)    Inpatient Diabetes Program Recommendations:     Please consider adding Novolog 3 units tid with meals if eats at least 50%.  Will continue to follow while inpatient.  Thank you, Dulce Sellar, RN, BSN Diabetes Coordinator Inpatient Diabetes Program [phone] (team pager from 8a-5p)

## 2020-09-23 DIAGNOSIS — F5105 Insomnia due to other mental disorder: Secondary | ICD-10-CM

## 2020-09-23 LAB — GLUCOSE, CAPILLARY
Glucose-Capillary: 382 mg/dL — ABNORMAL HIGH (ref 70–99)
Glucose-Capillary: 343 mg/dL — ABNORMAL HIGH (ref 70–99)
Glucose-Capillary: 339 mg/dL — ABNORMAL HIGH (ref 70–99)
Glucose-Capillary: 273 mg/dL — ABNORMAL HIGH (ref 70–99)
Glucose-Capillary: 329 mg/dL — ABNORMAL HIGH (ref 70–99)

## 2020-09-23 MED ORDER — QUETIAPINE FUMARATE 200 MG PO TABS
400.0000 mg | ORAL_TABLET | Freq: Every day | ORAL | Status: DC
  Administered 2020-09-23 – 2020-09-30 (×8): 400 mg via ORAL
  Filled 2020-09-23 (×8): qty 2

## 2020-09-23 NOTE — Progress Notes (Signed)
[facility] PROGRESS NOTE  Derek JackMichael Q Lattner WJX:[id number] DOB: 05/26/1986 DOA: 09/02/2020 PCP: Patient, No Pcp Per  Status: Remains inpatient appropriate because:Unsafe d/c plan   Dispo: The patient is from: Home              Anticipated d/c is to: Home (SNF recommended but patient refused)              Anticipated d/c date is: > 3 days              Patient currently is medically stable to d/c.  Barriers to discharge: Patient refusing SNF placement.  Waxing and waning ability to have capacity to make medical decisions.  Very deconditioned and unable to return to home environment due to ambulatory dysfunction.   Code Status: Full Family Communication: Patient DVT prophylaxis: SCDs Vaccination status: No documentation that patient has received Covid vaccination  Foley catheter: No-external male urinary collection device in place   HPI: With history of diabetes type 1, anxiety/depression manifested as bipolar disorder, seizure disorder, hydrocephalus and tobacco use.  He was sent to the ER with altered mental status with seizure like activity and found to be in severe DKA with encephalopathy that required intubation/ventilation from 11/12-11/14.  Subsequent CT head demonstrated persistent marked hydrocephalus assistant with acute aqueductal stenosis.  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  Neurosurgery did not recommend any surgical intervention or placement of new drain.  He was initially treated for aspiration pneumonia with IV Rocephin and vancomycin.  Subsequently patient has been transferred out of the ICU.  PT/OT have recommended CIR but patient does not have insurance.  Medicaid application in process.  SNF recommended for short-term rehab but patient has refused.  He lives with a disabled friend and there are multiple stairs within the home.  Based on current PT and OT evaluations patient is unable to discharge home independently based on  increased mobility needs.  Because of concerns for capacity psychiatry was consulted.  It was documented that his insight and judgment as continued to fluctuate throughout this hospitalization and thus was deemed lacking of capacity to make medical decisions.  He did not meet criteria for inpatient behavioral health treatment though.  Plan is to focus on aggressive PT and OT and allow patient to discharge to home environment once physically stable.  Subjective: Awakened.  Reporting ongoing issues with insomnia..  Discussed with patient that we will need to continue to participate with PT and be able to navigate stairs before can be discharged home.  Once again recommended that he go to SNF short-term for rehab and reinforced that SNF placement is not permanent.  Objective: Vitals:   09/23/20 0015 09/23/20 0947  BP: 110/75 116/77  Pulse: 73 88  Resp: 18 16  Temp: 97.7 F (36.5 C) 98.2 F (36.8 C)  SpO2: 99% 98%    Intake/Output Summary (Last 24 hours) at 09/23/2020 1051 Last data filed at 09/23/2020 0700 Gross per 24 hour  Intake 240 ml  Output 3425 ml  Net -3185 ml   Filed Weights   09/07/20 0500 09/08/20 0500 09/09/20 0453  Weight: 103 kg 102.2 kg 97.4 kg    Exam:  Constitutional: NAD, calm, comfortable Respiratory: clear to auscultation bilaterally.  Normal respiratory effort.  Room air Cardiovascular: Regular rate and rhythm, no murmurs / rubs / gallops. No extremity edema.  Propria capillary refill Abdomen: no tenderness, tolerating diet.  Bowel sounds positive.  Musculoskeletal: no clubbing / cyanosis.  No joint deformity upper and lower extremities. Good ROM, no contractures. Normal muscle tone.  Neurologic: CN 2-12 grossly intact. Sensation intact, Strength 5/5 x upper extremities and 4/5 lower extremities extremities.  Psychiatric:  Alert and oriented x 3. Normal mood.  Did not evaluate in detail issues related to capacity.   Assessment/Plan: Acute problems: Severe  physical deconditioning with associated ambulatory dysfunction -No focal neurological deficits only generalized weakness of lower extremities -Continues to require max assist with therapies and at this juncture SNF continues to be recommended by therapy providers.  Patient has multiple stairs in the home he shares with a friend and would need to be able to navigate stairs before can safely discharge home. -PT/OT schedule 4x per week  Acute metabolic encephalopathy/Capacity concerns/bipolar disorder -Presented with acute encephalopathy likely related to DKA, hypoxia and aspiration pneumonitis -Continues to have waxing and waning issues regarding capacity per psychiatry team: documented that he lacks insight and is unable to link because of relationships.  Evidence is based on his inability to weigh current physical limitations, remains limitations and need for physical therapy services.  I suspect this is patient's chronic baseline from his ongoing bipolar disorder.  Of note did not meet criteria for inpatient hospitalization -Will need outpatient APS follow-up; it appears patient had previously been enrolled in Medicaid but has been unable to consistently complete required follow-up paperwork to remain enrolled and will need assistance with this in the future -We will continue Seroquel, Depakote and Lexapro -IM Haldol prn for agitation -Melatonin at HS for insomnia symptoms; will increase Seroquel from 300 to 400 mg at bedtime.  May need to add daytime dosing as well with underlying bipolar disorder  Uncontrolled diabetes type 1 with hyperglycemia and presentation with DKA, hypoglycemic episodes since admission -Brittle diabetes -Hemoglobin A1c 8.2 -Continue NPH insulin 25 units BID -Continue SSI -Current CBGs have been between 68 and 182 therefore will allow for degree of mild permissive hyperglycemia; could conceivably give meal coverage as long as eats 50% of meals but patient will be unable to  manage SSI after discharge  Chronic hydrocephalus/?history of seizure disorder -Persistent changes of hydrocephalus involving the third and lateral ventricles with normal fourth ventricle assistant with aqueductal stenosis -Neurosurgery evaluated during admission and no indications for surgical intervention -There was also a question of possible seizure-like activity at presentation but was evaluated by neurology earlier in the hospitalization who determined he was not actively seizing -Continue Depakote  Dysphagia -Stable on D3 diet  Nutrition Status: Nutrition Problem: Inadequate oral intake-RESOLVED-now eating 100% of meals Etiology: inability to eat-resolved Signs/Symptoms: NPO status-now on D3 diet with Glucerna supplements, oral vitamin B6 replacement Interventions: Refer to RD note for recommendations     Other problems: Electrolyte abnormalities (Low K/Mg/Phos) -Labs have been stable after patient given replacement -Primarily related to presentation with DKA  Dyslipidemia -Continue Lipitor  Hypothyroidism with elevated TSH -TSH 14.4 (was 18.4 in May) -Noncompliant suspected although could be taking Synthroid with food which will inhibit correct absorption -Continue Synthroid 50 mcg daily -Repeat TSH mid December 2021  Anemia of chronic disease -Hemoglobin has remained stable with last reading 11.8 on 11/21   Data Reviewed: Basic Metabolic Panel: No results for input(s): NA, K, CL, CO2, GLUCOSE, BUN, CREATININE, CALCIUM, MG, PHOS in the last 168 hours. Liver Function Tests: No results for input(s): AST, ALT, ALKPHOS, BILITOT, PROT, ALBUMIN in the last 168 hours. No results for input(s): LIPASE, AMYLASE in the last 168 hours. No results for input(s): AMMONIA  in the last 168 hours. CBC: No results for input(s): WBC, NEUTROABS, HGB, HCT, MCV, PLT in the last 168 hours. Cardiac Enzymes: No results for input(s): CKTOTAL, CKMB, CKMBINDEX, TROPONINI in the last 168  hours. BNP (last 3 results) No results for input(s): BNP in the last 8760 hours.  ProBNP (last 3 results) No results for input(s): PROBNP in the last 8760 hours.  CBG: Recent Labs  Lab 09/22/20 1159 09/22/20 1311 09/22/20 1712 09/22/20 2208 09/23/20 0945  GLUCAP 68* 120* 221* 347* 382*    No results found for this or any previous visit (from the past 240 hour(s)).   Studies: No results found.  Scheduled Meds: . atorvastatin  20 mg Oral Daily  . Chlorhexidine Gluconate Cloth  6 each Topical Daily  . divalproex  500 mg Oral Daily  . divalproex  750 mg Oral QPM  . escitalopram  20 mg Oral Daily  . feeding supplement (GLUCERNA SHAKE)  237 mL Oral TID BM  . insulin aspart  0-5 Units Subcutaneous QHS  . insulin aspart  0-9 Units Subcutaneous TID WC  . insulin NPH Human  25 Units Subcutaneous BID AC & HS  . levothyroxine  50 mcg Oral Q0600  . melatonin  6 mg Oral QHS  . pantoprazole  40 mg Oral Q1200  . vitamin B-6  50 mg Oral Daily  . QUEtiapine  300 mg Oral QHS   Continuous Infusions:  Principal Problem:   AMS (altered mental status) Active Problems:   Diabetic keto-acidosis (HCC)   Obstructive hydrocephalus (HCC)   Bipolar I disorder, most recent episode depressed, severe without psychotic features (HCC)   Acute metabolic encephalopathy   Bipolar I disorder (HCC)   Uncontrolled type 1 diabetes mellitus with hyperglycemia (HCC)   Physical deconditioning   Ambulatory dysfunction   Consultants: Neurology Neurosurgery PCCM Psychiatry  Procedures:  Intubation/extubation  EEG  MBSS  Antibiotics: Anti-infectives (From admission, onward)   Start     Dose/Rate Route Frequency Ordered Stop   09/03/20 0945  cefTRIAXone (ROCEPHIN) 2 g in sodium chloride 0.9 % 100 mL IVPB        2 g 200 mL/hr over 30 Minutes Intravenous Every 24 hours 09/03/20 0854 09/06/20 1126   09/03/20 0800  ceFEPIme (MAXIPIME) 2 g in sodium chloride 0.9 % 100 mL IVPB  Status:   Discontinued        2 g 200 mL/hr over 30 Minutes Intravenous Every 24 hours 09/02/20 0742 09/02/20 1331   09/02/20 2200  vancomycin (VANCOREADY) IVPB 750 mg/150 mL  Status:  Discontinued        750 mg 150 mL/hr over 60 Minutes Intravenous Every 12 hours 09/02/20 1329 09/03/20 0854   09/02/20 1800  ceFEPIme (MAXIPIME) 2 g in sodium chloride 0.9 % 100 mL IVPB  Status:  Discontinued        2 g 200 mL/hr over 30 Minutes Intravenous Every 8 hours 09/02/20 1331 09/03/20 0854   09/02/20 0745  ceFEPIme (MAXIPIME) 2 g in sodium chloride 0.9 % 100 mL IVPB        2 g 200 mL/hr over 30 Minutes Intravenous  Once 09/02/20 0732 09/02/20 0932   09/02/20 0745  vancomycin (VANCOREADY) IVPB 2000 mg/400 mL        2,000 mg 200 mL/hr over 120 Minutes Intravenous  Once 09/02/20 0733 09/02/20 1257   09/02/20 0741  vancomycin variable dose per unstable renal function (pharmacist dosing)  Status:  Discontinued  Does not apply See admin instructions 09/02/20 0742 09/02/20 1329   09/02/20 0730  aztreonam (AZACTAM) 2 g in sodium chloride 0.9 % 100 mL IVPB  Status:  Discontinued        2 g 200 mL/hr over 30 Minutes Intravenous  Once 09/02/20 0728 09/02/20 0732   09/02/20 0730  metroNIDAZOLE (FLAGYL) IVPB 500 mg        500 mg 100 mL/hr over 60 Minutes Intravenous  Once 09/02/20 0728 09/02/20 1040   09/02/20 0730  vancomycin (VANCOCIN) IVPB 1000 mg/200 mL premix  Status:  Discontinued        1,000 mg 200 mL/hr over 60 Minutes Intravenous  Once 09/02/20 0728 09/02/20 0733       Time spent: 20 minutes    Junious Silk ANP  [facility] Pager [phone].

## 2020-09-23 NOTE — Progress Notes (Signed)
Physical Therapy Treatment Patient Details Name: Mark [name] MRN: [id number] DOB: 11-07-1985 Today's Date: 09/23/2020    History of Present Illness 34 year old male with history of seizures on Depakote, type 1 diabetes with frequent episodes of DKA, hydrocephalus who presents to the emergency department on 11/12 with altered mental status with CBG in 500s and sonorous breath sounds.    PT Comments    Planned to treat pt in conjunction with OT to advance standing balance and gait. However, upon arrival pt was agitated and asking "why is my doctor saying I cannot go home because I am incompetent?". Attempted to allow pt to understand his difficulties with functional mobility that place him at risk for falls and injuries by allowing pt to attempt to transition to sit EOB with bed flat and no use of bed rails to simulate home. Pt initiated movement but then when he encountered some difficulty he immediately stopped and refused further attempts. He even refused to roll to allow [place] to change his sheets as they were wet, despite pt education on risk for skin breakdown. Remainder of session focused on educating pt on his deficits of slow and decreased processing and sequencing skills along with barriers at home, like stairs, being very difficult for him. Pt stated he has an additional male and male roommate who will and can assist him 24/7 and pull him up the stairs in a w/c. Followed up with case manager, who reported no knowledge or mention of these additional roommates or support by pt's current roommate. Pt ultimately refused to participate in any further interventions and stated "I am pissed" and "I will not do anything more for you". Will continue to follow acutely.   Follow Up Recommendations  Supervision/Assistance - 24 hour;SNF     Equipment Recommendations  Rolling walker with 5" wheels;3in1 (PT);Wheelchair (measurements PT);Wheelchair cushion (measurements PT);Other (comment);Hospital bed  (ramp)    Recommendations for Other Services       Precautions / Restrictions Precautions Precautions: Fall Restrictions Weight Bearing Restrictions: No    Mobility  Bed Mobility Overal bed mobility: Needs Assistance             General bed mobility comments: Initiated supine to sit EOB with bed flat and no bed rails to simulate home. Pt started to move legs off EOB and rotate trunk but then faced a little difficulty and immediately stopped. Pt refused further attempts and stated "I do not need to prove anything to you!" and said "I do not care, I will not do anything else with you all".  Transfers                 General transfer comment: Pt refused  Ambulation/Gait             General Gait Details: Pt refused   Stairs             Wheelchair Mobility    Modified Rankin (Stroke Patients Only)       Balance       Sitting balance - Comments: Pt refused even when coaxed to change positions to allow [place] to change sheets as they were saturated.       Standing balance comment: Pt refused                            Cognition Arousal/Alertness: Awake/alert Behavior During Therapy: Agitated Overall Cognitive Status: No family/caregiver present to determine baseline cognitive functioning  General Comments: Pt very agitated upon arrival, resportedly continuously calling for doctor per RN. Attempted to have pt perform bed mob but after attempt with min guard and pt experiencing some difficulty he immediately stopped and refused further attempts. Pt very unaware of his deficits and safety concerns with discharging home. Attempted to educate pt on concerns of negotiating stairs and all functional mob. Pt educated need to train pt's roommates if he were to d/c home.      Exercises      General Comments        Pertinent Vitals/Pain Pain Assessment: Faces Faces Pain Scale: Hurts a little  bit Pain Location: legs Pain Descriptors / Indicators: Discomfort Pain Intervention(s): Limited activity within patient's tolerance;Monitored during session;Repositioned    Home Living                      Prior Function            PT Goals (current goals can now be found in the care plan section) Acute Rehab PT Goals Patient Stated Goal: to go home PT Goal Formulation: With patient Time For Goal Achievement: 10/07/20 Potential to Achieve Goals: Fair Progress towards PT goals: Not progressing toward goals - comment (pt refusal this date)    Frequency    Min 4X/week      PT Plan Current plan remains appropriate    Co-evaluation PT/OT/SLP Co-Evaluation/Treatment: Yes Reason for Co-Treatment: Complexity of the patient's impairments (multi-system involvement);Necessary to address cognition/behavior during functional activity;For patient/therapist safety;To address functional/ADL transfers PT goals addressed during session: Mobility/safety with mobility;Other (comment) (safety awareness and d/c planning)        AM-PAC PT "6 Clicks" Mobility   Outcome Measure  Help needed turning from your back to your side while in a flat bed without using bedrails?: A Lot Help needed moving from lying on your back to sitting on the side of a flat bed without using bedrails?: A Lot Help needed moving to and from a bed to a chair (including a wheelchair)?: A Lot Help needed standing up from a chair using your arms (e.g., wheelchair or bedside chair)?: A Lot Help needed to walk in hospital room?: Total Help needed climbing 3-5 steps with a railing? : Total 6 Click Score: 10    End of Session   Activity Tolerance: Treatment limited secondary to agitation Patient left: in bed;with call bell/phone within reach;with bed alarm set   PT Visit Diagnosis: Other abnormalities of gait and mobility (R26.89);Muscle weakness (generalized) (M62.81);Unsteadiness on feet (R26.81);Difficulty in  walking, not elsewhere classified (R26.2)     Time: 6967-8938 PT Time Calculation (min) (ACUTE ONLY): 16 min  Charges:                        [name], PT, DPT Acute Rehabilitation Services  Pager: [phone] Office: [phone]    [name] 09/23/2020, 12:55 PM

## 2020-09-23 NOTE — Progress Notes (Signed)
Occupational Therapy Treatment Patient Details Name: Mark [name] MRN: [id number] DOB: 1986/01/18 Today's Date: 09/23/2020    History of present illness 34 year old male with history of seizures on Depakote, type 1 diabetes with frequent episodes of DKA, hydrocephalus who presents to the emergency department on 11/12 with altered mental status with CBG in 500s and sonorous breath sounds.   OT comments  Co-treat planned to advance functional transfers/mobility, OOB activity tolerance, and strength but upon entry, patient agitated with inability to d/c from hospital this date. Patient initiated supine to EOB transfer with HOB flat to simulate home environment but when the task became difficult, patient refused to advance further stating "I don't have to prove anything to you!". Patient wants to go home despite recommendations but acknowledging need for external assistance for entering/exiting home, functional transfers, and ADLs including bathing/dressing/toileting but continues to demonstrate decreased awareness of need for adequate and safe d/c plan including patient/caregiver education. Per case manager/social worker, patient with inaccessible home environment as he is unable to ascend/descend steps at this time and continues to require +2 assist. Recommendation for SNF rehab given patients CLOF.    Follow Up Recommendations  SNF    Equipment Recommendations  3 in 1 bedside commode;Tub/shower bench    Recommendations for Other Services      Precautions / Restrictions Precautions Precautions: Fall Restrictions Weight Bearing Restrictions: No       Mobility Bed Mobility Overal bed mobility: Needs Assistance             General bed mobility comments: Initiated supine to sit EOB with bed flat and no bed rails to simulate home. Pt started to move legs off EOB and rotate trunk but then faced a little difficulty and immediately stopped. Pt refused further attempts and stated "I do  not need to prove anything to you!" and said "I do not care, I will not do anything else with you all".  Transfers                 General transfer comment: Pt refused    Balance       Sitting balance - Comments: Pt refused even when coaxed to change positions to allow [place] to change sheets as they were saturated.       Standing balance comment: Pt refused                           ADL either performed or assessed with clinical judgement   ADL                                               Vision       Perception     Praxis      Cognition Arousal/Alertness: Awake/alert Behavior During Therapy: Agitated Overall Cognitive Status: No family/caregiver present to determine baseline cognitive functioning                                 General Comments: Patient very agitated upon entry. RN reports patient repeatedly asking to speak with MD about d/c'ing today. Per conversation with patient, he is very unaware how much physical assistance he is requriring.          Exercises     Shoulder Instructions  General Comments      Pertinent Vitals/ Pain       Pain Assessment: Faces Faces Pain Scale: Hurts a little bit Pain Location: legs Pain Descriptors / Indicators: Discomfort Pain Intervention(s): Limited activity within patient's tolerance;Monitored during session  Home Living                                          Prior Functioning/Environment              Frequency  Min 2X/week        Progress Toward Goals  OT Goals(current goals can now be found in the care plan section)  Progress towards OT goals: Progressing toward goals  Acute Rehab OT Goals Patient Stated Goal: to go home today OT Goal Formulation: With patient Time For Goal Achievement: 10/07/20 Potential to Achieve Goals: Fair ADL Goals Pt Will Perform Grooming: with supervision;sitting Pt Will Perform Lower Body  Bathing: with min assist;sit to/from stand Pt Will Perform Upper Body Dressing: with supervision;sitting Pt Will Perform Lower Body Dressing: with min assist;sit to/from stand Pt Will Transfer to Toilet: with min assist;stand pivot transfer;bedside commode Pt/caregiver will Perform Home Exercise Program: Increased strength;Increased ROM;Both right and left upper extremity;With minimal assist;With written HEP provided Additional ADL Goal #1: Pt will maintain static sitting balance with minguard assist > 10 min as precursor to ADL. Additional ADL Goal #2: Pt will follow multi step commands with 75% accuracy and no more than min cues.  Plan Discharge plan remains appropriate    Co-evaluation      Reason for Co-Treatment: Complexity of the patient's impairments (multi-system involvement);Necessary to address cognition/behavior during functional activity;For patient/therapist safety;To address functional/ADL transfers PT goals addressed during session: Mobility/safety with mobility;Other (comment) (safety awareness and d/c planning) OT goals addressed during session: ADL's and self-care;Other (comment) (d/c planning)      AM-PAC OT "6 Clicks" Daily Activity     Outcome Measure   Help from another person eating meals?: A Little Help from another person taking care of personal grooming?: A Little Help from another person toileting, which includes using toliet, bedpan, or urinal?: A Lot Help from another person bathing (including washing, rinsing, drying)?: A Lot Help from another person to put on and taking off regular upper body clothing?: A Little Help from another person to put on and taking off regular lower body clothing?: A Lot 6 Click Score: 15    End of Session    OT Visit Diagnosis: Other abnormalities of gait and mobility (R26.89);Muscle weakness (generalized) (M62.81);Unsteadiness on feet (R26.81);Other symptoms and signs involving cognitive function   Activity Tolerance      Patient Left in bed;with call bell/phone within reach;with bed alarm set   Nurse Communication          Time: 1220-1240 OT Time Calculation (min): 20 min  Charges: OT General Charges $OT Visit: 1 Visit OT Treatments $Therapeutic Activity: 8-22 mins  Tae Vonada H. OTR/L Supplemental OT, Department of rehab services [phone]   Lashya Passe R H. 09/23/2020, 1:40 PM

## 2020-09-23 NOTE — Progress Notes (Signed)
Pt aggravated all day, being rude to staff. Threatens to leave, says he doesn't understand why he has to stay. Has been on call light nonstop, yelling out, disturbing patient care. All of the sudden he is incontinent, NT has been providing pericare. Late afternoon, this RN answered to the pt's request to be cleaned again. RN notified pt that there was no stool. Pt agitated and did not let RN finish cleanup. Called again a little later, asking the same thing. RN changed clean pads, showed to pt, along with the linen. Was still not happy with the result.

## 2020-09-24 DIAGNOSIS — R5381 Other malaise: Secondary | ICD-10-CM

## 2020-09-24 DIAGNOSIS — R4182 Altered mental status, unspecified: Secondary | ICD-10-CM

## 2020-09-24 LAB — GLUCOSE, CAPILLARY
Glucose-Capillary: 207 mg/dL — ABNORMAL HIGH (ref 70–99)
Glucose-Capillary: 177 mg/dL — ABNORMAL HIGH (ref 70–99)
Glucose-Capillary: 175 mg/dL — ABNORMAL HIGH (ref 70–99)
Glucose-Capillary: 213 mg/dL — ABNORMAL HIGH (ref 70–99)
Glucose-Capillary: 360 mg/dL — ABNORMAL HIGH (ref 70–99)

## 2020-09-24 LAB — BASIC METABOLIC PANEL
Anion gap: 11 (ref 5–15)
BUN: 15 mg/dL (ref 6–20)
CO2: 27 mmol/L (ref 22–32)
Calcium: 9.6 mg/dL (ref 8.9–10.3)
Chloride: 98 mmol/L (ref 98–111)
Creatinine, Ser: 0.87 mg/dL (ref 0.61–1.24)
GFR, Estimated: >60 mL/min (ref >60)
Glucose, Bld: 270 mg/dL — ABNORMAL HIGH (ref 70–99)
Potassium: 3.9 mmol/L (ref 3.5–5.1)
Sodium: 136 mmol/L (ref 135–145)

## 2020-09-24 NOTE — Progress Notes (Signed)
PROGRESS NOTE    Mark [name]   ZHG:[id number]  DOB: [birth year]  DOA: 09/02/2020     22  PCP: Patient, No Pcp Per  CC: AMS  Hospital Course: 34 year old male with history of DM-1, anxiety, depression, bipolar disorder, seizure disorder, hydrocephalus and tobacco use disorder brought to ED with altered mental status and found to be in severe DKA with encephalopathy requiring intubation and mechanical ventilation from 11/12-11/14. CT head showed marked hydrocephalus with acute aqueductal stenosis and right frontal ventriculostomy site with drain.  Neurosurgery does not recommend any surgical intervention.  He was also treated for aspiration pneumonia with IV ceftriaxone and vancomycin.     Patient is currently on room air and dysphagia 3 diet.  Completed antibiotic course for pneumonia.  Therapy recommended CIR. However, patient has no insurance and does not appear to be a candidate for Medicaid.  He also cannot afford CIR. Patient is maximum assist to get out of the bed.  No family members.  He lives with his friend who is also disabled.  There is no safe disposition at this time.   Patient initially was refusing SNF and due to his underlying mentation, psychiatry was consulted for evaluating capacity.  As of 09/20/2020 he was not deemed to have capacity for deciding disposition placement. With further conversations, he was then amenable to SNF placement if needed.  Due to his insurance issues however, he will likely continue with rehab inpatient until he is strong enough for safe discharge back home.   Interval History:  Apparently he has intermittent episodes of being disruptive with staff. During my exams each day, he is very cooperative and even yesterday was able to repeat back instructions to me that he was remaining in the hospital to get stronger until he was considered safe for going home; especially since we would not be able to find adequate skilled nursing given lack of insurance  or Medicaid. He again voiced understanding to why he was still in the hospital this morning. However, after me leaving the room he had declined the CNA from taking his vitals.  Old records reviewed in assessment of this patient  ROS: Constitutional: negative for chills and malaise, Respiratory: negative for cough, Cardiovascular: negative for chest pain and Gastrointestinal: negative for abdominal pain  Assessment & Plan: Ambulatory dysfunction, deconditioning, debility: Patient is currently maximum assist even to get out of the bed.  Per patient and patient's friend, he was able to ambulate independently for most part prior to this hospitalization. His neuro exam appears intact except for bilateral lower extremity weakness (4/5).  His proprioception is off.  CK and vitamin B12 within normal.  TSH was elevated likely due to noncompliance with his Synthroid.  Therapy recommended CIR but he has no insurance.  Reportedly not a candidate for Medicaid.  Also cannot afford CIR. Lives with a friend who is disabled to take care of the patient.  No safe disposition at this time. -Continue therapy inpatient - now he is amenable to SNF (still not likely to happen); but psych has evaluated and he does not have capacity at this time which agreed to may change in the future as he further improves -Continue ongoing therapy for now; hopeful that patient can regain enough strength and mobility to return home  Acute hypoxic respiratory failure with compromised airway and aspiration pneumonitis: Resolved. -S/p intubation and mechanical ventilation 11/12-11/14.  Currently on room air. -Completed antibiotic course with Rocephin and vancomycin. -Aspiration precautions  Uncontrolled DM-1 with  hypo and hyperglycemia: A1c 8.2%.  DKA resolved.  Labile CBG -Continue SSI and CBGs  Acute metabolic encephalopathy: Likely due to the above.  Seems to have resolved but poor insight. -Reorientation and delirium  precautions  Anxiety/depression/bipolar disorder/insomnia: He endorsed SI previously and evaluated by psych on 09/11/2020.  Psychiatry did not think he is an imminent risk to self or others.  He now refuses care intermittently and also SNF.  He demonstrated medical decision making capacity previously -As of 09/20/2020, psychiatry does not believe patient has capacity.  This will be an ongoing evaluation -Continue Seroquel, Depakote and Lexapro -IM Haldol as needed for agitation. -Discussed sleep hygiene about insomnia. -Continue melatonin.  Chronic hydrocephalus: CT head showed marked hydrocephalus with acute aqueductal stenosis and right frontal ventriculostomy site with drain.Neurosurgeon does not recommend any surgical intervention.   Dysphagia following intubation: SLP recommended dysphagia 3 diet. -Continue dysphagia-3 diet and SLP evaluation -Continue Protonix.  Hypokalemia, hypomagnesemia, hypophosphatemia: seems to have resolved but no recent labs. -Monitor intermittently  Anemia ofchronic disease: H&H stable. -Monitor intermittently  Homelessness? patient lives with his friend [name].  They co-own the house.   History of hyperlipidemia  -Continue Lipitor  Hypothyroidism: TSH was elevated at 14.4. Possibility of noncompliance.  -Continue Synthroid 50 mcg daily -Recheck TSH in 3 to 4 weeks   Antimicrobials:   DVT prophylaxis: SCD Code Status: FULL Family Communication: none present Disposition Plan: Status is: Inpatient  Remains inpatient appropriate because:Unsafe d/c plan and Inpatient level of care appropriate due to severity of illness   Dispo: The patient is from: Home              Anticipated d/c is to: likely home due to lack of insurance and medicaid              Anticipated d/c date is: > 3 days              Patient currently is medically stable to d/c.  Objective: Blood pressure 103/71, pulse 80, temperature 97.7 F (36.5 C), temperature  source Oral, resp. rate 19, height 6\' 2"  (1.88 m), weight 97.4 kg, SpO2 98 %.  Examination: General appearance: Slowed mentation but able to answer all questions and engage in conversation easily Head: Normocephalic, without obvious abnormality, atraumatic Eyes: EOMI Lungs: clear to auscultation bilaterally Heart: regular rate and rhythm and S1, S2 normal Abdomen: normal findings: bowel sounds normal and soft, non-tender Extremities: No edema Skin: mobility and turgor normal Neurologic: Grossly normal  Consultants:   Neurology  Procedures:     Data Reviewed: I have personally reviewed following labs and imaging studies Results for orders placed or performed during the hospital encounter of 09/02/20 (from the past 24 hour(s))  Glucose, capillary     Status: Abnormal   Collection Time: 09/23/20  1:28 PM  Result Value Ref Range   Glucose-Capillary 339 (H) 70 - 99 mg/dL  Glucose, capillary     Status: Abnormal   Collection Time: 09/23/20  4:38 PM  Result Value Ref Range   Glucose-Capillary 273 (H) 70 - 99 mg/dL  Glucose, capillary     Status: Abnormal   Collection Time: 09/23/20  9:11 PM  Result Value Ref Range   Glucose-Capillary 329 (H) 70 - 99 mg/dL  Basic metabolic panel     Status: Abnormal   Collection Time: 09/24/20  2:05 AM  Result Value Ref Range   Sodium 136 135 - 145 mmol/L   Potassium 3.9 3.5 - 5.1 mmol/L   Chloride  98 98 - 111 mmol/L   CO2 27 22 - 32 mmol/L   Glucose, Bld 270 (H) 70 - 99 mg/dL   BUN 15 6 - 20 mg/dL   Creatinine, Ser 2.95 0.61 - 1.24 mg/dL   Calcium 9.6 8.9 - 18.8 mg/dL   GFR, Estimated >41 >66 mL/min   Anion gap 11 5 - 15  Glucose, capillary     Status: Abnormal   Collection Time: 09/24/20  6:19 AM  Result Value Ref Range   Glucose-Capillary 207 (H) 70 - 99 mg/dL  Glucose, capillary     Status: Abnormal   Collection Time: 09/24/20  8:05 AM  Result Value Ref Range   Glucose-Capillary 177 (H) 70 - 99 mg/dL  Glucose, capillary     Status:  Abnormal   Collection Time: 09/24/20 11:35 AM  Result Value Ref Range   Glucose-Capillary 175 (H) 70 - 99 mg/dL    No results found for this or any previous visit (from the past 240 hour(s)).   Radiology Studies: No results found. DG Swallowing Func-Speech Pathology  Final Result    DG Chest [facility] 1 View  Final Result    DG Chest Port 1 View  Final Result    DG Chest Port 1 View  Final Result    DG Chest Portable 1 View  Final Result    CT Head Wo Contrast  Final Result    DG Chest Portable 1 View  Final Result      Scheduled Meds: . atorvastatin  20 mg Oral Daily  . Chlorhexidine Gluconate Cloth  6 each Topical Daily  . divalproex  500 mg Oral Daily  . divalproex  750 mg Oral QPM  . escitalopram  20 mg Oral Daily  . feeding supplement (GLUCERNA SHAKE)  237 mL Oral TID BM  . insulin aspart  0-5 Units Subcutaneous QHS  . insulin aspart  0-9 Units Subcutaneous TID WC  . insulin NPH Human  25 Units Subcutaneous BID AC & HS  . levothyroxine  50 mcg Oral Q0600  . melatonin  6 mg Oral QHS  . pantoprazole  40 mg Oral Q1200  . vitamin B-6  50 mg Oral Daily  . QUEtiapine  400 mg Oral QHS   PRN Meds: acetaminophen, docusate, haloperidol lactate, polyethylene glycol Continuous Infusions:   LOS: 22 days  Time spent: Greater than 50% of the 35 minute visit was spent in counseling/coordination of care for the patient as laid out in the A&P.   Lewie Chamber, MD [facility] 09/24/2020, 12:13 PM

## 2020-09-24 NOTE — Consult Note (Signed)
[facility] Face-to-Face Psychiatry Consult   Reason for Consult: ''Repeat Capacity evaluation.''  Referring Physician: Lewie Chamber, MD Patient Identification: Mark [name] MRN:  [id number] Principal Diagnosis: AMS (altered mental status) Diagnosis:  Principal Problem:   AMS (altered mental status) Active Problems:   Diabetic keto-acidosis (HCC)   Obstructive hydrocephalus (HCC)   Bipolar I disorder, most recent episode depressed, severe without psychotic features (HCC)   Acute metabolic encephalopathy   Bipolar I disorder (HCC)   Uncontrolled type 1 diabetes mellitus with hyperglycemia (HCC)   Physical deconditioning   Ambulatory dysfunction   Insomnia due to mental disorder   Total Time spent with patient: 30 minutes  Subjective:  ''I don't know why I am here. Everyone is making me angry here.'' Objective:  34 y.o. male with history of Major depression, Anxiety, Bipolar disorder, prior suicide attempts, seizures disorder on Depakote, type 1 diabetes with frequent episodes of DKA, hydrocephalus with shunts who was admitted with altered mental status. Currently, patient is alert and oriented but unable to recollect the sequence of events that lead to his hospitalization. Patient has difficulty answering questions and processing information and gets frustrated very easily even when he is asked very simple questions. Patient admits to poor memory, focuses on being discharged home but has little or no insight into his current condition. He is determined to go home but unable to state where he lives and how he is going to continue to get help for his medical and psychological needs. In terms of decision making capacity, patient lacks insight, and is unable to link causal relationships. This is evident by his inability to weigh his current physical limitations and need for physical therapy services. Therefore he lacks capacity as he is unable to appreciate the effects of treatment and his need for  ongoing services.   Past Psychiatric History: as above Risk to Self:  Denies  Risk to Others:  Denies Prior Inpatient Therapy:  6 months at [facility] Prior Outpatient Therapy:  Denies  Past Medical History:  Past Medical History:  Diagnosis Date  . Anxiety   . Bipolar disorder (HCC)   . Depression   . Diabetes mellitus without complication (HCC)   . Diabetic polyneuropathy associated with type 1 diabetes mellitus (HCC)   . Hydrocephalus (HCC)   . Kidney stones   . Seizures (HCC)     Past Surgical History:  Procedure Laterality Date  . KIDNEY STONE SURGERY     Family History:  Family History  Problem Relation Age of Onset  . Breast cancer Mother    Family Psychiatric  History: Denies Social History:  Social History   Substance and Sexual Activity  Alcohol Use Yes   Comment: occassional      Social History   Substance and Sexual Activity  Drug Use Yes  . Frequency: 7.0 times per week  . Types: Marijuana   Comment: smoked 2-3 days ago, usually smokes daily    Social History   Socioeconomic History  . Marital status: Single    Spouse name: Not on file  . Number of children: Not on file  . Years of education: Not on file  . Highest education level: Not on file  Occupational History  . Not on file  Tobacco Use  . Smoking status: Current Every Day Smoker    Packs/day: 1.00    Years: 20.00    Pack years: 20.00    Types: Cigarettes  . Smokeless tobacco: Never Used  Vaping Use  .  Vaping Use: Never used  Substance and Sexual Activity  . Alcohol use: Yes    Comment: occassional   . Drug use: Yes    Frequency: 7.0 times per week    Types: Marijuana    Comment: smoked 2-3 days ago, usually smokes daily  . Sexual activity: Not Currently  Other Topics Concern  . Not on file  Social History Narrative  . Not on file   Social Determinants of Health   Financial Resource Strain:   . Difficulty of Paying Living Expenses: Not on file  Food Insecurity:   .  Worried About Programme researcher, broadcasting/film/video in the Last Year: Not on file  . Ran Out of Food in the Last Year: Not on file  Transportation Needs:   . Lack of Transportation (Medical): Not on file  . Lack of Transportation (Non-Medical): Not on file  Physical Activity:   . Days of Exercise per Week: Not on file  . Minutes of Exercise per Session: Not on file  Stress:   . Feeling of Stress : Not on file  Social Connections:   . Frequency of Communication with Friends and Family: Not on file  . Frequency of Social Gatherings with Friends and Family: Not on file  . Attends Religious Services: Not on file  . Active Member of Clubs or Organizations: Not on file  . Attends Banker Meetings: Not on file  . Marital Status: Not on file   Additional Social History:    Allergies:   Allergies  Allergen Reactions  . Mushroom Extract Complex Hives, Itching and Nausea And Vomiting  . Penicillins Anaphylaxis, Hives and Swelling    Has patient had a PCN reaction causing immediate rash, facial/tongue/throat swelling, SOB or lightheadedness with hypotension: Yes Has patient had a PCN reaction causing severe rash involving mucus membranes or skin necrosis: No Has patient had a PCN reaction that required hospitalization: Yes Has patient had a PCN reaction occurring within the last 10 years: Yes If all of the above answers are "NO", then may proceed with Cephalosporin use. Tolerated ceftriaxone 03/24/20  . Sulfa Antibiotics Anaphylaxis and Hives  . Clindamycin/Lincomycin Hives    Labs:  Results for orders placed or performed during the hospital encounter of 09/02/20 (from the past 48 hour(s))  Glucose, capillary     Status: Abnormal   Collection Time: 09/22/20 11:59 AM  Result Value Ref Range   Glucose-Capillary 68 (L) 70 - 99 mg/dL    Comment: Glucose reference range applies only to samples taken after fasting for at least 8 hours.  Glucose, capillary     Status: Abnormal   Collection Time:  09/22/20  1:11 PM  Result Value Ref Range   Glucose-Capillary 120 (H) 70 - 99 mg/dL    Comment: Glucose reference range applies only to samples taken after fasting for at least 8 hours.  Glucose, capillary     Status: Abnormal   Collection Time: 09/22/20  5:12 PM  Result Value Ref Range   Glucose-Capillary 221 (H) 70 - 99 mg/dL    Comment: Glucose reference range applies only to samples taken after fasting for at least 8 hours.   Comment 1 Notify RN   Glucose, capillary     Status: Abnormal   Collection Time: 09/22/20 10:08 PM  Result Value Ref Range   Glucose-Capillary 347 (H) 70 - 99 mg/dL    Comment: Glucose reference range applies only to samples taken after fasting for at least 8 hours.  Glucose, capillary     Status: Abnormal   Collection Time: 09/23/20  9:45 AM  Result Value Ref Range   Glucose-Capillary 382 (H) 70 - 99 mg/dL    Comment: Glucose reference range applies only to samples taken after fasting for at least 8 hours.  Glucose, capillary     Status: Abnormal   Collection Time: 09/23/20 11:50 AM  Result Value Ref Range   Glucose-Capillary 343 (H) 70 - 99 mg/dL    Comment: Glucose reference range applies only to samples taken after fasting for at least 8 hours.  Glucose, capillary     Status: Abnormal   Collection Time: 09/23/20  1:28 PM  Result Value Ref Range   Glucose-Capillary 339 (H) 70 - 99 mg/dL    Comment: Glucose reference range applies only to samples taken after fasting for at least 8 hours.  Glucose, capillary     Status: Abnormal   Collection Time: 09/23/20  4:38 PM  Result Value Ref Range   Glucose-Capillary 273 (H) 70 - 99 mg/dL    Comment: Glucose reference range applies only to samples taken after fasting for at least 8 hours.  Glucose, capillary     Status: Abnormal   Collection Time: 09/23/20  9:11 PM  Result Value Ref Range   Glucose-Capillary 329 (H) 70 - 99 mg/dL    Comment: Glucose reference range applies only to samples taken after fasting  for at least 8 hours.  Basic metabolic panel     Status: Abnormal   Collection Time: 09/24/20  2:05 AM  Result Value Ref Range   Sodium 136 135 - 145 mmol/L   Potassium 3.9 3.5 - 5.1 mmol/L   Chloride 98 98 - 111 mmol/L   CO2 27 22 - 32 mmol/L   Glucose, Bld 270 (H) 70 - 99 mg/dL    Comment: Glucose reference range applies only to samples taken after fasting for at least 8 hours.   BUN 15 6 - 20 mg/dL   Creatinine, Ser 0.62 0.61 - 1.24 mg/dL   Calcium 9.6 8.9 - 69.4 mg/dL   GFR, Estimated >85 >46 mL/min    Comment: (NOTE) Calculated using the CKD-EPI Creatinine Equation (2021)    Anion gap 11 5 - 15    Comment: Performed at [facility], [address] [place], [place] [address]  Glucose, capillary     Status: Abnormal   Collection Time: 09/24/20  6:19 AM  Result Value Ref Range   Glucose-Capillary 207 (H) 70 - 99 mg/dL    Comment: Glucose reference range applies only to samples taken after fasting for at least 8 hours.  Glucose, capillary     Status: Abnormal   Collection Time: 09/24/20  8:05 AM  Result Value Ref Range   Glucose-Capillary 177 (H) 70 - 99 mg/dL    Comment: Glucose reference range applies only to samples taken after fasting for at least 8 hours.  Glucose, capillary     Status: Abnormal   Collection Time: 09/24/20 11:35 AM  Result Value Ref Range   Glucose-Capillary 175 (H) 70 - 99 mg/dL    Comment: Glucose reference range applies only to samples taken after fasting for at least 8 hours.    Current Facility-Administered Medications  Medication Dose Route Frequency Provider Last Rate Last Admin  . acetaminophen (TYLENOL) tablet 650 mg  650 mg Oral Q6H PRN Pokhrel, Laxman, MD      . atorvastatin (LIPITOR) tablet 20 mg  20 mg Oral Daily  Joycelyn Das, MD   20 mg at 09/24/20 0826  . Chlorhexidine Gluconate Cloth 2 % PADS 6 each  6 each Topical Daily Lynnell Catalan, MD   6 each at 09/17/20 1127  . divalproex (DEPAKOTE) DR tablet 500 mg  500 mg Oral Daily  Lynnell Catalan, MD   500 mg at 09/24/20 0825  . divalproex (DEPAKOTE) DR tablet 750 mg  750 mg Oral QPM Agarwala, Daleen Bo, MD   750 mg at 09/23/20 1737  . docusate (COLACE) 50 MG/5ML liquid 100 mg  100 mg Oral Daily PRN Pokhrel, Laxman, MD      . escitalopram (LEXAPRO) tablet 20 mg  20 mg Oral Daily Lynnell Catalan, MD   20 mg at 09/24/20 0826  . feeding supplement (GLUCERNA SHAKE) (GLUCERNA SHAKE) liquid 237 mL  237 mL Oral TID BM Gonfa, Taye T, MD   237 mL at 09/24/20 0826  . haloperidol lactate (HALDOL) injection 5 mg  5 mg Intramuscular Q6H PRN Lurene Shadow, MD   5 mg at 09/12/20 1305  . insulin aspart (novoLOG) injection 0-5 Units  0-5 Units Subcutaneous QHS Almon Hercules, MD   4 Units at 09/23/20 2136  . insulin aspart (novoLOG) injection 0-9 Units  0-9 Units Subcutaneous TID WC Almon Hercules, MD   2 Units at 09/24/20 0825  . insulin NPH Human (NOVOLIN N) injection 25 Units  25 Units Subcutaneous BID AC & HS Almon Hercules, MD   25 Units at 09/24/20 0824  . levothyroxine (SYNTHROID) tablet 50 mcg  50 mcg Oral Q0600 Joycelyn Das, MD   50 mcg at 09/23/20 0612  . melatonin tablet 6 mg  6 mg Oral QHS Candelaria Stagers T, MD   6 mg at 09/23/20 2125  . pantoprazole (PROTONIX) EC tablet 40 mg  40 mg Oral Q1200 Pokhrel, Laxman, MD   40 mg at 09/23/20 1140  . polyethylene glycol (MIRALAX / GLYCOLAX) packet 17 g  17 g Oral Daily PRN Pokhrel, Laxman, MD      . pyridOXINE (VITAMIN B-6) tablet 50 mg  50 mg Oral Daily Candelaria Stagers T, MD   50 mg at 09/24/20 0826  . QUEtiapine (SEROQUEL) tablet 400 mg  400 mg Oral QHS Russella Dar, NP   400 mg at 09/23/20 2125    Musculoskeletal: Strength & Muscle Tone: within normal limits Gait & Station:  seen resting in bed  Patient leans: N/A  Psychiatric Specialty Exam: Physical Exam Vitals reviewed.  Neurological:     Mental Status: He is alert.  Psychiatric:        Attention and Perception: Attention normal.        Mood and Affect: Mood normal. Affect is  angry.        Behavior: Behavior normal. Behavior is cooperative.        Thought Content: Thought content normal.        Cognition and Memory: Cognition normal. He exhibits impaired recent memory.        Judgment: Judgment is impulsive.     Review of Systems  Psychiatric/Behavioral: Positive for behavioral problems. Negative for decreased concentration and hallucinations.  All other systems reviewed and are negative.   Blood pressure 103/71, pulse 80, temperature 97.7 F (36.5 C), temperature source Oral, resp. rate 19, height 6\' 2"  (1.88 m), weight 97.4 kg, SpO2 98 %.Body mass index is 27.57 kg/m.  General Appearance: Fairly Groomed and Guarded and flat   Eye Contact:  Good  Speech:  Clear and Coherent  Volume:  Normal  Mood:  Angry  Affect:  Appropriate  Thought Process:  Coherent  Orientation:  Full (Time, Place, and Person)  Thought Content:  Logical  Suicidal Thoughts:  No, contracts for safety  Homicidal Thoughts:  No  Memory:  Immediate;   Fair Recent;   Poor Remote;   Fair  Judgement:  Other:  Shallow  Insight:  Shallow  Psychomotor Activity:  Normal  Concentration:  Concentration: Fair and Attention Span: Fair  Recall:  Fair to poor  [employer] of Knowledge:  Fair  Language:  Fair  Akathisia:  No  Handed:  Right  AIMS (if indicated):     Assets:  Communication Skills Desire for Improvement Social Support  ADL's:  Intact  Cognition:  WNL  Sleep:        Treatment Plan Summary: Plan Continue with current home medications at this time.    Based on my assessment today, patient still lacks capacity to make informed medical decision. He lacks insight and judgment regarding his overall recovery and need for ongoing rehabilitation. Also, his mental disorder may affects his ability to make informed decisions.  Recommendations: -Consider social worker consult to contact  ethics or [facility] to determine the next step regarding patient's  treatment. -Consider contacting patient's family/next of kin regarding power of attorney and guardianship.  Disposition: No evidence of imminent risk to self or others at present.   Patient does not meet criteria for psychiatric inpatient admission. Supportive therapy provided about ongoing stressors. Psychiatric unit is signing out. Re-consult as needed  Thedore MinsMojeed Georgiann Neider, MD 09/24/2020 11:56 AM

## 2020-09-25 LAB — GLUCOSE, CAPILLARY
Glucose-Capillary: 442 mg/dL — ABNORMAL HIGH (ref 70–99)
Glucose-Capillary: 215 mg/dL — ABNORMAL HIGH (ref 70–99)
Glucose-Capillary: 314 mg/dL — ABNORMAL HIGH (ref 70–99)
Glucose-Capillary: 306 mg/dL — ABNORMAL HIGH (ref 70–99)

## 2020-09-25 MED ORDER — INSULIN ASPART 100 UNIT/ML ~~LOC~~ SOLN
10.0000 [IU] | Freq: Once | SUBCUTANEOUS | Status: AC
  Administered 2020-09-25: 10 [IU] via SUBCUTANEOUS

## 2020-09-25 NOTE — Progress Notes (Signed)
   09/25/20 1813  Assess: MEWS Score  Temp (!) 101.6 F (38.7 C)  BP 125/85  Pulse Rate 79  Resp 18  SpO2 99 %  O2 Device Room Air  Assess: MEWS Score  MEWS Temp 2  MEWS Systolic 0  MEWS Pulse 0  MEWS RR 0  MEWS LOC 0  MEWS Score 2  MEWS Score Color Yellow  Assess: if the MEWS score is Yellow or Red  Were vital signs taken at a resting state? Yes  Focused Assessment No change from prior assessment  Early Detection of Sepsis Score *See Row Information* Low  MEWS guidelines implemented *See Row Information* No, vital signs rechecked  Treat  Pain Scale 0-10  Pain Score 0  Pain Intervention(s) Medication (See eMAR)  Take Vital Signs  Increase Vital Sign Frequency  Yellow: Q 2hr X 2 then Q 4hr X 2, if remains yellow, continue Q 4hrs  Escalate  MEWS: Escalate Yellow: discuss with charge nurse/RN and consider discussing with provider and RRT  Notify: Charge Nurse/RN  Name of Charge Nurse/RN Notified Marylene Land  Date Charge Nurse/RN Notified 09/25/20  Time Charge Nurse/RN Notified 1818

## 2020-09-25 NOTE — Progress Notes (Addendum)
Patient has been cursing at staff on the intercom. RN went into patient's room to find 3 snack cups on the floor near the door and the water pitcher spilled on the floor.Patient refused evening insulin stating, "I already feel bad, I may as well feel worse." RN offered blanket, snacks to patient, but he refused.

## 2020-09-25 NOTE — Progress Notes (Signed)
PROGRESS NOTE    Mark [name]   HBZ:[id number]  DOB: 12-Oct-1986  DOA: 09/02/2020     23  PCP: Patient, No Pcp Per  CC: AMS  Hospital Course: 34 year old male with history of DM-1, anxiety, depression, bipolar disorder, seizure disorder, hydrocephalus and tobacco use disorder brought to ED with altered mental status and found to be in severe DKA with encephalopathy requiring intubation and mechanical ventilation from 11/12-11/14. CT head showed marked hydrocephalus with acute aqueductal stenosis and right frontal ventriculostomy site with drain.  Neurosurgery does not recommend any surgical intervention.  He was also treated for aspiration pneumonia with IV ceftriaxone and vancomycin.     Patient is currently on room air and dysphagia 3 diet.  Completed antibiotic course for pneumonia.  Therapy recommended CIR. However, patient has no insurance and does not appear to be a candidate for Medicaid.  He also cannot afford CIR. Patient is maximum assist to get out of the bed.  No family members.  He lives with his friend who is also disabled.  There is no safe disposition at this time.   Patient initially was refusing SNF and due to his underlying mentation, psychiatry was consulted for evaluating capacity.  As of 09/20/2020 he was not deemed to have capacity for deciding disposition placement. With further conversations, he was then amenable to SNF placement if needed.  Due to his insurance issues however, he will likely continue with rehab inpatient until he is strong enough for safe discharge back home.   Interval History:  Apparently he has intermittent episodes of being disruptive with staff. During my exams each day, he is very cooperative and even yesterday was able to repeat back instructions to me that he was remaining in the hospital to get stronger until he was considered safe for going home; especially since we would not be able to find adequate skilled nursing given lack of insurance  or Medicaid. He again voiced understanding to why he was still in the hospital this morning. However, after me leaving the room he had declined the CNA from taking his vitals.  He has been refusing insulin on occasion most notably overnight with rebound hypoglycemia this morning.  Old records reviewed in assessment of this patient  ROS: Constitutional: negative for chills and malaise, Respiratory: negative for cough, Cardiovascular: negative for chest pain and Gastrointestinal: negative for abdominal pain  Assessment & Plan:  Acute ambulatory dysfunction, deconditioning, debility: Patient is currently maximum assist even to get out of the bed.  Per patient and patient's friend, he was able to ambulate independently for most part prior to this hospitalization. His neuro exam appears intact except for bilateral lower extremity weakness (4/5).  His proprioception is off.  CK and vitamin B12 within normal.  TSH was elevated likely due to noncompliance with his Synthroid.  Therapy recommended CIR but he has no insurance.  Reportedly not a candidate for Medicaid.  Also cannot afford CIR. Lives with a friend who is disabled to take care of the patient.  No safe disposition at this time. -Continue therapy inpatient - now he is amenable to SNF (still not likely to happen); but psych has evaluated and he does not have capacity at this time which agreed to may change in the future as he further improves -Continue ongoing therapy for now; hopeful that patient can regain enough strength and mobility to return home  Acute hypoxic respiratory failure with compromised airway and aspiration pneumonitis: Resolved. -S/p intubation and mechanical ventilation 11/12-11/14.  Currently on room air. -Completed antibiotic course with Rocephin and vancomycin. -Aspiration precautions  Uncontrolled DM-1 with hypo and hyperglycemia: A1c 8.2%.  DKA resolved.  Labile CBG -Continue SSI and CBGs -Continues to refuse  insulin/medications per staff  Acute metabolic encephalopathy: Likely due to the above.  Seems to have resolved but poor insight. -Reorientation and delirium precautions  Anxiety/depression/bipolar disorder/insomnia: He endorsed SI previously and evaluated by psych on 09/11/2020.  Psychiatry did not think he is an imminent risk to self or others.  He now refuses care intermittently and also SNF.  He demonstrated medical decision making capacity previously -As of 09/20/2020, psychiatry does not believe patient has capacity.  This will be an ongoing evaluation -Continue Seroquel, Depakote and Lexapro -IM Haldol as needed for agitation. -Discussed sleep hygiene about insomnia. -Continue melatonin.  Chronic hydrocephalus: CT head showed marked hydrocephalus with acute aqueductal stenosis and right frontal ventriculostomy site with drain.Neurosurgeon does not recommend any surgical intervention.   Dysphagia following intubation: SLP recommended dysphagia 3 diet. -Continue dysphagia-3 diet and SLP evaluation -Continue Protonix.  Hypokalemia, hypomagnesemia, hypophosphatemia: seems to have resolved but no recent labs. -Monitor intermittently  Anemia ofchronic disease: H&H stable. -Monitor intermittently  Homelessness? patient lives with his friend Mark [name].  They co-own the house.   History of hyperlipidemia  -Continue Lipitor  Hypothyroidism: TSH was elevated at 14.4. Possibility of noncompliance.  -Continue Synthroid 50 mcg daily -Recheck TSH in 3 to 4 weeks   Antimicrobials:   DVT prophylaxis: SCD Code Status: FULL Family Communication: none present Disposition Plan: Status is: Inpatient  Remains inpatient appropriate because:Unsafe d/c plan and Inpatient level of care appropriate due to severity of illness   Dispo: The patient is from: Home              Anticipated d/c is to: likely home due to lack of insurance and medicaid              Anticipated d/c date  is: > 3 days              Patient currently is medically stable to d/c.  Objective: Blood pressure 113/65, pulse 82, temperature 97.7 F (36.5 C), temperature source Oral, resp. rate 18, height 6\' 2"  (1.88 m), weight 97.4 kg, SpO2 99 %.  Examination: General appearance: Slowed mentation but able to answer all questions and engage in conversation easily Head: Normocephalic, without obvious abnormality, atraumatic Eyes: EOMI Lungs: clear to auscultation bilaterally Heart: regular rate and rhythm and S1, S2 normal Abdomen: normal findings: bowel sounds normal and soft, non-tender Extremities: No edema Skin: mobility and turgor normal Neurologic: Grossly normal  Consultants:   Neurology  Procedures:     Data Reviewed: I have personally reviewed following labs and imaging studies Results for orders placed or performed during the hospital encounter of 09/02/20 (from the past 24 hour(s))  Glucose, capillary     Status: Abnormal   Collection Time: 09/24/20  8:05 AM  Result Value Ref Range   Glucose-Capillary 177 (H) 70 - 99 mg/dL  Glucose, capillary     Status: Abnormal   Collection Time: 09/24/20 11:35 AM  Result Value Ref Range   Glucose-Capillary 175 (H) 70 - 99 mg/dL  Glucose, capillary     Status: Abnormal   Collection Time: 09/24/20  3:58 PM  Result Value Ref Range   Glucose-Capillary 213 (H) 70 - 99 mg/dL  Glucose, capillary     Status: Abnormal   Collection Time: 09/24/20 10:05 PM  Result Value Ref  Range   Glucose-Capillary 360 (H) 70 - 99 mg/dL   Comment 1 Notify RN    Comment 2 Document in Chart   Glucose, capillary     Status: Abnormal   Collection Time: 09/25/20  6:32 AM  Result Value Ref Range   Glucose-Capillary 442 (H) 70 - 99 mg/dL   Comment 1 Notify RN    Comment 2 Document in Chart     No results found for this or any previous visit (from the past 240 hour(s)).   Radiology Studies: No results found. DG Swallowing Func-Speech Pathology  Final Result      DG Chest [facility] 1 View  Final Result    DG Chest Port 1 View  Final Result    DG Chest Port 1 View  Final Result    DG Chest Portable 1 View  Final Result    CT Head Wo Contrast  Final Result    DG Chest Portable 1 View  Final Result      Scheduled Meds: . atorvastatin  20 mg Oral Daily  . Chlorhexidine Gluconate Cloth  6 each Topical Daily  . divalproex  500 mg Oral Daily  . divalproex  750 mg Oral QPM  . escitalopram  20 mg Oral Daily  . feeding supplement (GLUCERNA SHAKE)  237 mL Oral TID BM  . insulin aspart  0-5 Units Subcutaneous QHS  . insulin aspart  0-9 Units Subcutaneous TID WC  . insulin NPH Human  25 Units Subcutaneous BID AC & HS  . levothyroxine  50 mcg Oral Q0600  . melatonin  6 mg Oral QHS  . pantoprazole  40 mg Oral Q1200  . vitamin B-6  50 mg Oral Daily  . QUEtiapine  400 mg Oral QHS   PRN Meds: acetaminophen, docusate, haloperidol lactate, polyethylene glycol Continuous Infusions:   LOS: 23 days  Time spent: Greater than 50% of the 35 minute visit was spent in counseling/coordination of care for the patient as laid out in the A&P.   Azucena Fallen, DO [facility] 09/25/2020, 7:36 AM

## 2020-09-25 NOTE — Plan of Care (Signed)
  Problem: Clinical Measurements: Goal: Respiratory complications will improve Outcome: Progressing   Problem: Nutrition: Goal: Adequate nutrition will be maintained Outcome: Progressing   Problem: Activity: Goal: Risk for activity intolerance will decrease Outcome: Progressing   Problem: Coping: Goal: Level of anxiety will decrease Outcome: Progressing

## 2020-09-26 ENCOUNTER — Inpatient Hospital Stay (HOSPITAL_COMMUNITY): Payer: Medicaid Other

## 2020-09-26 DIAGNOSIS — R509 Fever, unspecified: Secondary | ICD-10-CM

## 2020-09-26 LAB — CBC WITH DIFFERENTIAL/PLATELET
Abs Immature Granulocytes: 0.04 10*3/uL (ref 0.00–0.07)
Basophils Absolute: 0.1 10*3/uL (ref 0.0–0.1)
Basophils Relative: 1 %
Eosinophils Absolute: 0.2 10*3/uL (ref 0.0–0.5)
Eosinophils Relative: 2 %
HCT: 41.4 % (ref 39.0–52.0)
Hemoglobin: 14.3 g/dL (ref 13.0–17.0)
Immature Granulocytes: 1 %
Lymphocytes Relative: 27 %
Lymphs Abs: 2.2 10*3/uL (ref 0.7–4.0)
MCH: 30.6 pg (ref 26.0–34.0)
MCHC: 34.5 g/dL (ref 30.0–36.0)
MCV: 88.5 fL (ref 80.0–100.0)
Monocytes Absolute: 0.6 10*3/uL (ref 0.1–1.0)
Monocytes Relative: 7 %
Neutro Abs: 5.1 10*3/uL (ref 1.7–7.7)
Neutrophils Relative %: 62 %
Platelets: 373 10*3/uL (ref 150–400)
RBC: 4.68 MIL/uL (ref 4.22–5.81)
RDW: 12.4 % (ref 11.5–15.5)
WBC: 8.2 10*3/uL (ref 4.0–10.5)
nRBC: 0 % (ref 0.0–0.2)

## 2020-09-26 LAB — GLUCOSE, CAPILLARY
Glucose-Capillary: 168 mg/dL — ABNORMAL HIGH (ref 70–99)
Glucose-Capillary: 149 mg/dL — ABNORMAL HIGH (ref 70–99)
Glucose-Capillary: 198 mg/dL — ABNORMAL HIGH (ref 70–99)
Glucose-Capillary: 290 mg/dL — ABNORMAL HIGH (ref 70–99)
Glucose-Capillary: 270 mg/dL — ABNORMAL HIGH (ref 70–99)
Glucose-Capillary: 185 mg/dL — ABNORMAL HIGH (ref 70–99)

## 2020-09-26 LAB — COMPREHENSIVE METABOLIC PANEL
ALT: 16 U/L (ref 0–44)
AST: 18 U/L (ref 15–41)
Albumin: 3.6 g/dL (ref 3.5–5.0)
Alkaline Phosphatase: 71 U/L (ref 38–126)
Anion gap: 12 (ref 5–15)
BUN: 15 mg/dL (ref 6–20)
CO2: 28 mmol/L (ref 22–32)
Calcium: 9.7 mg/dL (ref 8.9–10.3)
Chloride: 99 mmol/L (ref 98–111)
Creatinine, Ser: 0.94 mg/dL (ref 0.61–1.24)
GFR, Estimated: >60 mL/min (ref >60)
Glucose, Bld: 153 mg/dL — ABNORMAL HIGH (ref 70–99)
Potassium: 4.1 mmol/L (ref 3.5–5.1)
Sodium: 139 mmol/L (ref 135–145)
Total Bilirubin: 0.6 mg/dL (ref 0.3–1.2)
Total Protein: 7.2 g/dL (ref 6.5–8.1)

## 2020-09-26 MED ORDER — QUETIAPINE FUMARATE 50 MG PO TABS
50.0000 mg | ORAL_TABLET | Freq: Every morning | ORAL | Status: DC
  Administered 2020-09-26 – 2020-09-30 (×5): 50 mg via ORAL
  Filled 2020-09-26 (×5): qty 1

## 2020-09-26 NOTE — Plan of Care (Signed)
  Problem: Health Behavior/Discharge Planning: Goal: Ability to manage health-related needs will improve Outcome: Not Progressing   Problem: Clinical Measurements: Goal: Ability to maintain clinical measurements within normal limits will improve Outcome: Not Progressing Goal: Will remain free from infection Outcome: Not Progressing   Problem: Elimination: Goal: Will not experience complications related to bowel motility Outcome: Not Progressing

## 2020-09-26 NOTE — Progress Notes (Signed)
Occupational Therapy Treatment Patient Details Name: Mark [name] MRN: [id number] DOB: [birth year] Today's Date: 09/26/2020    History of present illness 34 year old male with history of seizures on Depakote, type 1 diabetes with frequent episodes of DKA, hydrocephalus who presents to the emergency department on 11/12 with altered mental status with CBG in 500s and sonorous breath sounds.   OT comments  Pt seen in conjunction with PT to maximize pts activity tolerance. Pt continues to present with impaired sitting balance, decreased BLE strength, and impaired proprioception impacting pts ability to complete BADLs. Pt completed x3 sit<>stands this session with eva walker with pt needing MIN A +2 to stand but required MAX A +2 to maintain upright posture.  Pt with no awareness to impaired balance and decreased core strength as pt noted to have no righting reactions when beginning to lose his balance EOB. Pt continues to decline SNF however feel this is pts best option as pt now reports his roommate is only "mentally disabled" however pt is still requiring +2 assist to mobilize to EOB. Pt would continue to benefit from skilled occupational therapy while admitted and after d/c to address the below listed limitations in order to improve overall functional mobility and facilitate independence with BADL participation. DC plan remains appropriate, will follow acutely per POC.    Follow Up Recommendations  SNF    Equipment Recommendations  3 in 1 bedside commode;Tub/shower bench    Recommendations for Other Services      Precautions / Restrictions Precautions Precautions: Fall Restrictions Weight Bearing Restrictions: No       Mobility Bed Mobility Overal bed mobility: Needs Assistance Bed Mobility: Supine to Sit     Supine to sit: Max assist;+2 for physical assistance;HOB elevated     General bed mobility comments: MAX A +2 to transition from supine>sitting with HOB elevated. attempted  to allow pt to do as much as possible however pt stating, " can I have some help here" needing assist to maneuver BLEs to EOB and MAX A +2 to elevate trunk and maintain sitting balance EOB  Transfers Overall transfer level: Needs assistance Equipment used: Bilateral platform walker (eva walker) Transfers: Sit to/from Stand Sit to Stand: Min assist;From elevated surface;+2 physical assistance         General transfer comment: pt sit<>stand x3 from EOB needing MIN A +2 to power into standing but MOD- MAX A to maintain upright posture. pt with poor proprioception needing tactile cues to elevate trunk and manual facilitation to BLE to full extend hips and knees    Balance Overall balance assessment: Needs assistance Sitting-balance support: Single extremity supported;Feet supported;No upper extremity supported Sitting balance-Leahy Scale: Poor Sitting balance - Comments: pt requires up to MAX A for static sitting balance even with one UE supported   Standing balance support: Bilateral upper extremity supported Standing balance-Leahy Scale: Zero Standing balance comment: reliant on BUE support and external assist                           ADL either performed or assessed with clinical judgement   ADL Overall ADL's : Needs assistance/impaired                 Upper Body Dressing : Maximal assistance;Sitting;Bed level Upper Body Dressing Details (indicate cue type and reason): pt required MAX A to don gown d/t poor sitting balance     Toilet Transfer: Maximal assistance;+2 for physical assistance Toilet Transfer Details (  indicate cue type and reason): unable to pivot transfer but required MAX A +2 to maintain upright posture in standing in prep for transfer Toileting- Clothing Manipulation and Hygiene: Bed level;Total assistance Toileting - Clothing Manipulation Details (indicate cue type and reason): total A for posterior pericare from bed level after incontinent BM      Functional mobility during ADLs: Maximal assistance;+2 for physical assistance (eva walker; sit<>stand only) General ADL Comments: pt continues to present with balance deficits, decreased activity tolerance and generalized weakness RLE>LLE. pt become more frustrated about current level of function     Vision       Perception     Praxis      Cognition Arousal/Alertness: Awake/alert Behavior During Therapy: WFL for tasks assessed/performed;Flat affect (moments of agitation as pt states " I can't" whenever asked to do something) Overall Cognitive Status: Impaired/Different from baseline Area of Impairment: Safety/judgement;Awareness;Problem solving                         Safety/Judgement: Decreased awareness of safety;Decreased awareness of deficits Awareness: Intellectual Problem Solving: Slow processing;Decreased initiation;Requires tactile cues;Requires verbal cues;Difficulty sequencing General Comments: pt continues to have poor insight into deficits as pt continues to ask when he can go home even though pt currently requires MAX A +2 for bed mobility, pt overall slow to process needing tactile cues to self correct LOB        Exercises     Shoulder Instructions       General Comments continued education on trying to complete BLE/ BUE therex frequently throughout the day    Pertinent Vitals/ Pain       Pain Assessment: No/denies pain  Home Living                                          Prior Functioning/Environment              Frequency  Min 2X/week        Progress Toward Goals  OT Goals(current goals can now be found in the care plan section)  Progress towards OT goals: Progressing toward goals  Acute Rehab OT Goals Patient Stated Goal: to go home today OT Goal Formulation: With patient Time For Goal Achievement: 10/07/20 Potential to Achieve Goals: Fair  Plan Discharge plan remains appropriate;Frequency remains  appropriate    Co-evaluation      Reason for Co-Treatment: Complexity of the patient's impairments (multi-system involvement);For patient/therapist safety;To address functional/ADL transfers PT goals addressed during session: Mobility/safety with mobility;Balance;Strengthening/ROM OT goals addressed during session: ADL's and self-care      AM-PAC OT "6 Clicks" Daily Activity     Outcome Measure   Help from another person eating meals?: None Help from another person taking care of personal grooming?: A Little Help from another person toileting, which includes using toliet, bedpan, or urinal?: A Lot Help from another person bathing (including washing, rinsing, drying)?: A Lot Help from another person to put on and taking off regular upper body clothing?: A Little Help from another person to put on and taking off regular lower body clothing?: A Lot 6 Click Score: 16    End of Session Equipment Utilized During Treatment: Gait belt;Other (comment) (eva walker)  OT Visit Diagnosis: Other abnormalities of gait and mobility (R26.89);Muscle weakness (generalized) (M62.81);Unsteadiness on feet (R26.81);Other symptoms and signs involving cognitive function  Activity Tolerance Patient tolerated treatment well   Patient Left in bed;with call bell/phone within reach;with bed alarm set   Nurse Communication Mobility status        Time: 0768-0881 OT Time Calculation (min): 27 min  Charges: OT General Charges $OT Visit: 1 Visit OT Treatments $Therapeutic Activity: 8-22 mins Audery Amel., COTA/L Acute Rehabilitation Services [phone]    Angelina Pih 09/26/2020, 2:53 PM

## 2020-09-26 NOTE — Progress Notes (Signed)
Physical Therapy Treatment Patient Details Name: Mark [name] MRN: [id number] DOB: May 01, 1986 Today's Date: 09/26/2020    History of Present Illness 34 year old male with history of seizures on Depakote, type 1 diabetes with frequent episodes of DKA, hydrocephalus who presents to the emergency department on 11/12 with altered mental status with CBG in 500s and sonorous breath sounds.    PT Comments    Patient progressing very slowly towards PT goals. Continues to require Max A of 2 for bed mobility, "pull me up," despite cues. Pt with poor static and dynamic sitting balance needing Max A for support; no righting reactions present. Requires Min A of 2 to stand using EVA walker; able to take a step forwards/backwards with Max A but demonstrates flexed trunk/hips/knees, poor proprioception and weakness. Able to initiate upright posture with multimodal cues but unable to sustain, "I can't!" Pt with poor awareness of safety/deficits. Continually asking when he can go home despite being unable to get OOB, transfer or walk. Gets easily irritated when asked to do something he cannot do on his own. Encouraged pt to perform there ex of UE/LE and to transfer OOB via stedy with nursing 2-3x daily to improve strength. Continue to recommend SNF even though pt refuses and wants to return home. Will follow.   Follow Up Recommendations  Supervision/Assistance - 24 hour;SNF     Equipment Recommendations  Rolling walker with 5" wheels;3in1 (PT);Wheelchair (measurements PT);Wheelchair cushion (measurements PT);Other (comment);Hospital bed    Recommendations for Other Services       Precautions / Restrictions Precautions Precautions: Fall Restrictions Weight Bearing Restrictions: No    Mobility  Bed Mobility Overal bed mobility: Needs Assistance Bed Mobility: Supine to Sit     Supine to sit: Max assist;+2 for physical assistance;HOB elevated Sit to supine: Mod assist;HOB elevated   General bed  mobility comments: Able to bring LLE to EOB, pt reaching for therapist's hand, "pull me up." Assist with RLE and to scoot bottom to EOB. Flexed posture, poor trunk control. Assist to bring BLEs into bed, pt falling backwards onto bed.  Transfers Overall transfer level: Needs assistance Equipment used:  (EVA walker) Transfers: Sit to/from Stand Sit to Stand: Min assist;From elevated surface;+2 physical assistance         General transfer comment: Stood from EOB x3 with UE on EVA platform to rise and Min A of 2 especially needed in mid range due to weakness;max manual/verbal cues for hip/trunk/knee extension. Bil knees flexed with narrow boS. "I can't" Fatigues esp through trunk.  Ambulation/Gait Ambulation/Gait assistance: Max assist;+2 physical assistance Gait Distance (Feet): 2 Feet Assistive device:  (EVA walker) Gait Pattern/deviations: Trunk flexed;Narrow base of support;Decreased weight shift to right;Decreased weight shift to left Gait velocity: reduced   General Gait Details: Able to take a step forward/backward, able to advance RLE with flexed knees/hips but needs manual assist to bring RLE backwards. Max cues for upright posture despite pt pushing through UEs on EVA walker. Fatigues.   Stairs             Wheelchair Mobility    Modified Rankin (Stroke Patients Only)       Balance Overall balance assessment: Needs assistance Sitting-balance support: Single extremity supported;Feet supported;No upper extremity supported Sitting balance-Leahy Scale: Poor Sitting balance - Comments: pt requires up to MAX A for static sitting balance even with one UE supported, flexed posture in posterior pelvic tilt with no balance reactions with LOB posteriorly. "I can't"   Standing balance support: During functional  activity Standing balance-Leahy Scale: Zero Standing balance comment: reliant on BUE support and external assist. Worked on extending trunk/hip/knees with  tactile/verbal cues. Pt on flexed posture in standing despite assist. Need 3rd person.                            Cognition Arousal/Alertness: Awake/alert Behavior During Therapy: WFL for tasks assessed/performed;Flat affect (moments of agitation as pt states " I can't" whenever asked to do something) Overall Cognitive Status: Impaired/Different from baseline Area of Impairment: Safety/judgement;Awareness;Problem solving                         Safety/Judgement: Decreased awareness of safety;Decreased awareness of deficits Awareness: Intellectual Problem Solving: Slow processing;Decreased initiation;Requires tactile cues;Requires verbal cues;Difficulty sequencing General Comments: pt continues to have poor insight into deficits as pt continues to ask when he can go home even though pt currently requires MAX A +2 for bed mobility, pt overall slow to process needing tactile cues to self correct LOB      Exercises      General Comments General comments (skin integrity, edema, etc.): Continued education and encouragement on performing there ex of BLEs/UES and getting OOB 2-3x/day using stedy with nursing.      Pertinent Vitals/Pain Pain Assessment: No/denies pain    Home Living                      Prior Function            PT Goals (current goals can now be found in the care plan section) Acute Rehab PT Goals Patient Stated Goal: to go home today Progress towards PT goals: Progressing toward goals (slowly)    Frequency    Min 3X/week      PT Plan Frequency needs to be updated    Co-evaluation PT/OT/SLP Co-Evaluation/Treatment: Yes Reason for Co-Treatment: Complexity of the patient's impairments (multi-system involvement);For patient/therapist safety;To address functional/ADL transfers PT goals addressed during session: Mobility/safety with mobility;Balance;Strengthening/ROM OT goals addressed during session: ADL's and self-care       AM-PAC PT "6 Clicks" Mobility   Outcome Measure  Help needed turning from your back to your side while in a flat bed without using bedrails?: A Lot Help needed moving from lying on your back to sitting on the side of a flat bed without using bedrails?: Total Help needed moving to and from a bed to a chair (including a wheelchair)?: Total Help needed standing up from a chair using your arms (e.g., wheelchair or bedside chair)?: A Lot Help needed to walk in hospital room?: Total Help needed climbing 3-5 steps with a railing? : Total 6 Click Score: 8    End of Session Equipment Utilized During Treatment: Gait belt Activity Tolerance: Patient tolerated treatment well;Patient limited by fatigue Patient left: in bed;with call bell/phone within reach;with bed alarm set Nurse Communication: Mobility status;Need for lift equipment (stedy) PT Visit Diagnosis: Other abnormalities of gait and mobility (R26.89);Muscle weakness (generalized) (M62.81);Unsteadiness on feet (R26.81);Difficulty in walking, not elsewhere classified (R26.2)     Time: 3474-2595 PT Time Calculation (min) (ACUTE ONLY): 27 min  Charges:  $Therapeutic Activity: 8-22 mins                     Vale Haven, PT, DPT Acute Rehabilitation Services Pager [phone] Office [phone]       Blake Divine A Lanier Ensign 09/26/2020, 3:02 PM

## 2020-09-27 LAB — GLUCOSE, CAPILLARY
Glucose-Capillary: 301 mg/dL — ABNORMAL HIGH (ref 70–99)
Glucose-Capillary: 251 mg/dL — ABNORMAL HIGH (ref 70–99)
Glucose-Capillary: 204 mg/dL — ABNORMAL HIGH (ref 70–99)
Glucose-Capillary: 206 mg/dL — ABNORMAL HIGH (ref 70–99)
Glucose-Capillary: 343 mg/dL — ABNORMAL HIGH (ref 70–99)

## 2020-09-27 NOTE — Progress Notes (Signed)
[facility] PROGRESS NOTE  Mark JackMichael Q [name] NFA:[id number] DOB: 04/29/1986 DOA: 09/02/2020 PCP: Patient, No Pcp Per  Status: Remains inpatient appropriate because:Unsafe d/c plan   Dispo: The patient is from: Home              Anticipated d/c is to: Home (SNF recommended but patient refused)              Anticipated d/c date is: > 3 days              Patient currently is medically stable to d/c.  Barriers to discharge: Patient refusing SNF placement.  Waxing and waning ability to have capacity to make medical decisions.  Very deconditioned and unable to return to home environment due to ambulatory dysfunction.   Code Status: Full Family Communication: Patient DVT prophylaxis: SCDs Vaccination status: No documentation that patient has received Covid vaccination  Foley catheter: No-external male urinary collection device in place   HPI: With history of diabetes type 1, anxiety/depression manifested as bipolar disorder, seizure disorder, hydrocephalus and tobacco use.  He was sent to the ER with altered mental status with seizure like activity and found to be in severe DKA with encephalopathy that required intubation/ventilation from 11/12-11/14.  Subsequent CT head demonstrated persistent marked hydrocephalus assistant with acute aqueductal stenosis.  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  Neurosurgery did not recommend any surgical intervention or placement of new drain.  He was initially treated for aspiration pneumonia with IV Rocephin and vancomycin.  Subsequently patient has been transferred out of the ICU.  PT/OT have recommended CIR but patient does not have insurance.  Medicaid application in process.  SNF recommended for short-term rehab but patient has refused.  He lives with a disabled friend and there are multiple stairs within the home.  Based on current PT and OT evaluations patient is unable to discharge home independently based on  increased mobility needs.  Because of concerns for capacity psychiatry was consulted.  It was documented that his insight and judgment as continued to fluctuate throughout this hospitalization and thus was deemed lacking of capacity to make medical decisions.  He did not meet criteria for inpatient behavioral health treatment though.  Plan is to focus on aggressive PT and OT and allow patient to discharge to home environment once physically stable.  Subjective: Awakened.  Remains sleepy.  States felt like he did sleep a little better last night.  Discussed adjustments in Seroquel to better treat bipolar disorder and hopefully help him participate in therapies better from a mental standpoint  Objective: Vitals:   09/26/20 1624 09/26/20 1944  BP: 125/79 121/83  Pulse: 92 90  Resp: 16 16  SpO2: 98% 97%    Intake/Output Summary (Last 24 hours) at 09/27/2020 0847 Last data filed at 09/27/2020 [id number] Gross per 24 hour  Intake --  Output 1100 ml  Net -1100 ml   Filed Weights   09/07/20 0500 09/08/20 0500 09/09/20 0453  Weight: 103 kg 102.2 kg 97.4 kg    Exam:  Constitutional: NAD, calm, comfortable Respiratory: clear to auscultation bilaterally.  Normal respiratory effort.  Room air Cardiovascular: Regular rate and rhythm, no murmurs / rubs / gallops. No extremity edema.  Propria capillary refill Abdomen: no tenderness, tolerating diet.  Bowel sounds positive.  Musculoskeletal: no clubbing / cyanosis. No joint deformity upper and lower extremities. Good ROM, no contractures.  12/6 was noted with truncal instability with attempts to sit on EOB.  Neurologic: CN 2-12 grossly intact. Sensation intact, Strength 5/5 x upper extremities and 4/5 lower extremities extremities. Psychiatric:  Alert and oriented x 3. Normal mood.    Assessment/Plan: Acute problems: Severe physical deconditioning with associated ambulatory dysfunction -No focal neurological deficits only generalized weakness of lower  extremities -Continues to require max assist with therapies and at this juncture SNF continues to be recommended by therapy providers.  Patient has multiple stairs in the home he shares with a friend and would need to be able to navigate stairs before can safely discharge home. -PT/OT schedule 4x per week -Patient continues to become frustrated during therapy sessions due to his lack of progress in his apparent disconnect between his abilities and his desire to be at home  Fever -T-max 101.6 F overnight. -Noted with urinary incontinence and could have UTI therefore urinalysis and culture ordered but has not yet been obtained-apply condom cath to obtain specimen -Chest x-ray unremarkable -We will check CBC with differential within normal limits, CMET unremarkable and blood cultures pending  Acute metabolic encephalopathy/Capacity concerns/bipolar disorder -Presented with acute encephalopathy likely related to DKA, hypoxia and aspiration pneumonitis -Continues to have waxing and waning issues regarding capacity per psychiatry team (reevaluated again on 12/3): documented that he lacks insight and is unable to link because of relationships.  Evidence is based on his inability to weigh current physical limitations, remains limitations and need for physical therapy services.  I suspect this is patient's chronic baseline from his ongoing bipolar disorder.  Of note did not meet criteria for inpatient hospitalization -Will need outpatient APS follow-up; it appears patient had previously been enrolled in Medicaid but has been unable to consistently complete required follow-up paperwork to remain enrolled and will need assistance with this in the future -We will continue Seroquel, Depakote and Lexapro (max dose) -IM Haldol prn for agitation -Melatonin at HS for insomnia symptoms; Seroquel from increase 300 to 400 mg at bedtime last week.  Continue Seroquel 50 mg during the day  Uncontrolled diabetes type 1  with hyperglycemia and presentation with DKA, hypoglycemic episodes since admission -Brittle diabetes -Hemoglobin A1c 8.2 -Continue NPH insulin 25 units BID -Continue SSI -Current CBGs have been between 68 and 182 therefore will allow for degree of mild permissive hyperglycemia; could conceivably give meal coverage as long as eats 50% of meals but patient will be unable to manage SSI after discharge  Chronic hydrocephalus/?history of seizure disorder -Persistent changes of hydrocephalus involving the third and lateral ventricles with normal fourth ventricle assistant with aqueductal stenosis -Neurosurgery evaluated during admission and no indications for surgical intervention -There was also a question of possible seizure-like activity at presentation but was evaluated by neurology earlier in the hospitalization who determined he was not actively seizing -Continue Depakote  Dysphagia -Stable on D3 diet  Nutrition Status: Nutrition Problem: Inadequate oral intake-RESOLVED-now eating 100% of meals Etiology: inability to eat-resolved Signs/Symptoms: NPO status-now on D3 diet with Glucerna supplements, oral vitamin B6 replacement Interventions: Refer to RD note for recommendations     Other problems: Electrolyte abnormalities (Low K/Mg/Phos) -Labs have been stable after patient given replacement -Primarily related to presentation with DKA  Dyslipidemia -Continue Lipitor  Hypothyroidism with elevated TSH -TSH 14.4 (was 18.4 in May) -Noncompliant suspected although could be taking Synthroid with food which will inhibit correct absorption -Continue Synthroid 50 mcg daily -Repeat TSH mid December 2021  Anemia of chronic disease -Hemoglobin has remained stable with last reading 11.8 on 11/21   Data Reviewed: Basic Metabolic Panel:  Recent Labs  Lab 09/24/20 0205 09/26/20 1009  NA 136 139  K 3.9 4.1  CL 98 99  CO2 27 28  GLUCOSE 270* 153*  BUN 15 15  CREATININE 0.87 0.94   CALCIUM 9.6 9.7   Liver Function Tests: Recent Labs  Lab 09/26/20 1009  AST 18  ALT 16  ALKPHOS 71  BILITOT 0.6  PROT 7.2  ALBUMIN 3.6   No results for input(s): LIPASE, AMYLASE in the last 168 hours. No results for input(s): AMMONIA in the last 168 hours. CBC: Recent Labs  Lab 09/26/20 1009  WBC 8.2  NEUTROABS 5.1  HGB 14.3  HCT 41.4  MCV 88.5  PLT 373   Cardiac Enzymes: No results for input(s): CKTOTAL, CKMB, CKMBINDEX, TROPONINI in the last 168 hours. BNP (last 3 results) No results for input(s): BNP in the last 8760 hours.  ProBNP (last 3 results) No results for input(s): PROBNP in the last 8760 hours.  CBG: Recent Labs  Lab 09/26/20 1224 09/26/20 1342 09/26/20 1636 09/26/20 2110 09/27/20 0646  GLUCAP 198* 290* 270* 185* 301*    Recent Results (from the past 240 hour(s))  Culture, blood (Routine X 2) w Reflex to ID Panel     Status: None (Preliminary result)   Collection Time: 09/26/20 10:09 AM   Specimen: BLOOD  Result Value Ref Range Status   Specimen Description BLOOD RIGHT ANTECUBITAL  Final   Special Requests   Final    BOTTLES DRAWN AEROBIC AND ANAEROBIC Blood Culture adequate volume   Culture   Final    NO GROWTH < 24 HOURS Performed at [facility], [address] [place], [place] [address]    Report Status PENDING  Incomplete  Culture, blood (Routine X 2) w Reflex to ID Panel     Status: None (Preliminary result)   Collection Time: 09/26/20 10:19 AM   Specimen: BLOOD  Result Value Ref Range Status   Specimen Description BLOOD LEFT ANTECUBITAL  Final   Special Requests   Final    BOTTLES DRAWN AEROBIC AND ANAEROBIC Blood Culture results may not be optimal due to an inadequate volume of blood received in culture bottles   Culture   Final    NO GROWTH < 24 HOURS Performed at [facility], [address], [place], [place] [address]    Report Status PENDING  Incomplete     Studies: DG Chest 2 View  Result Date:  09/26/2020 CLINICAL DATA:  Fever EXAM: CHEST - 2 VIEW COMPARISON:  September 07, 2020 FINDINGS: Trachea midline. Cardiomediastinal contours and hilar structures are normal. Lungs are clear. No sign of pleural effusion. On limited assessment no acute skeletal process. IMPRESSION: No acute cardiopulmonary disease. Electronically Signed   By: Donzetta Kohut M.D.   On: 09/26/2020 09:04    Scheduled Meds: . atorvastatin  20 mg Oral Daily  . Chlorhexidine Gluconate Cloth  6 each Topical Daily  . divalproex  500 mg Oral Daily  . divalproex  750 mg Oral QPM  . escitalopram  20 mg Oral Daily  . feeding supplement (GLUCERNA SHAKE)  237 mL Oral TID BM  . insulin aspart  0-5 Units Subcutaneous QHS  . insulin aspart  0-9 Units Subcutaneous TID WC  . insulin NPH Human  25 Units Subcutaneous BID AC & HS  . levothyroxine  50 mcg Oral Q0600  . melatonin  6 mg Oral QHS  . pantoprazole  40 mg Oral Q1200  . vitamin B-6  50 mg Oral Daily  . QUEtiapine  400 mg Oral QHS  . QUEtiapine  50 mg Oral q morning - 10a   Continuous Infusions:  Principal Problem:   AMS (altered mental status) Active Problems:   Diabetic keto-acidosis (HCC)   Obstructive hydrocephalus (HCC)   Bipolar I disorder, most recent episode depressed, severe without psychotic features (HCC)   Acute metabolic encephalopathy   Bipolar I disorder (HCC)   Uncontrolled type 1 diabetes mellitus with hyperglycemia (HCC)   Physical deconditioning   Ambulatory dysfunction   Insomnia due to mental disorder   Fever   Consultants: Neurology Neurosurgery PCCM Psychiatry  Procedures:  Intubation/extubation  EEG  MBSS  Antibiotics: Anti-infectives (From admission, onward)   Start     Dose/Rate Route Frequency Ordered Stop   09/03/20 0945  cefTRIAXone (ROCEPHIN) 2 g in sodium chloride 0.9 % 100 mL IVPB        2 g 200 mL/hr over 30 Minutes Intravenous Every 24 hours 09/03/20 0854 09/06/20 1126   09/03/20 0800  ceFEPIme (MAXIPIME) 2 g  in sodium chloride 0.9 % 100 mL IVPB  Status:  Discontinued        2 g 200 mL/hr over 30 Minutes Intravenous Every 24 hours 09/02/20 0742 09/02/20 1331   09/02/20 2200  vancomycin (VANCOREADY) IVPB 750 mg/150 mL  Status:  Discontinued        750 mg 150 mL/hr over 60 Minutes Intravenous Every 12 hours 09/02/20 1329 09/03/20 0854   09/02/20 1800  ceFEPIme (MAXIPIME) 2 g in sodium chloride 0.9 % 100 mL IVPB  Status:  Discontinued        2 g 200 mL/hr over 30 Minutes Intravenous Every 8 hours 09/02/20 1331 09/03/20 0854   09/02/20 0745  ceFEPIme (MAXIPIME) 2 g in sodium chloride 0.9 % 100 mL IVPB        2 g 200 mL/hr over 30 Minutes Intravenous  Once 09/02/20 0732 09/02/20 0932   09/02/20 0745  vancomycin (VANCOREADY) IVPB 2000 mg/400 mL        2,000 mg 200 mL/hr over 120 Minutes Intravenous  Once 09/02/20 0733 09/02/20 1257   09/02/20 0741  vancomycin variable dose per unstable renal function (pharmacist dosing)  Status:  Discontinued         Does not apply See admin instructions 09/02/20 0742 09/02/20 1329   09/02/20 0730  aztreonam (AZACTAM) 2 g in sodium chloride 0.9 % 100 mL IVPB  Status:  Discontinued        2 g 200 mL/hr over 30 Minutes Intravenous  Once 09/02/20 0728 09/02/20 0732   09/02/20 0730  metroNIDAZOLE (FLAGYL) IVPB 500 mg        500 mg 100 mL/hr over 60 Minutes Intravenous  Once 09/02/20 0728 09/02/20 1040   09/02/20 0730  vancomycin (VANCOCIN) IVPB 1000 mg/200 mL premix  Status:  Discontinued        1,000 mg 200 mL/hr over 60 Minutes Intravenous  Once 09/02/20 0728 09/02/20 0733       Time spent: 30 minutes    Junious Silk ANP  [facility] Pager [phone].

## 2020-09-27 NOTE — Progress Notes (Signed)
Inpatient Diabetes Program Recommendations  AACE/ADA: New Consensus Statement on Inpatient Glycemic Control   Target Ranges:  Prepandial:   less than 140 mg/dL      Peak postprandial:   less than 180 mg/dL (1-2 hours)      Critically ill patients:  140 - 180 mg/dL   Results for Mark [name], Mark [name] (MRN [id number]) as of 09/27/2020 11:09  Ref. Range 09/26/2020 06:28 09/26/2020 09:21 09/26/2020 12:24 09/26/2020 13:42 09/26/2020 16:36 09/26/2020 21:10 09/27/2020 06:46 09/27/2020 09:30  Glucose-Capillary Latest Ref Range: 70 - 99 mg/dL 673 (H) 419 (H) 379 (H) 290 (H) 270 (H) 185 (H) 301 (H) 251 (H)   Review of Glycemic Control  Current orders for Inpatient glycemic control: NPH 25 units BID, Novolog 0-9 units TID with meals, Novolog 0-5 units QHS  Inpatient Diabetes Program Recommendations:    Insulin: Please consider increasing NPH to 27 units BID and adding Novolog 3 units TID with meals for meal coverage if patient eats at least 50% of meals.  Thanks, [name], RN, MSN, CDE Diabetes Coordinator Inpatient Diabetes Program [phone] (Team Pager from 8am to 5pm)

## 2020-09-28 LAB — GLUCOSE, CAPILLARY
Glucose-Capillary: 138 mg/dL — ABNORMAL HIGH (ref 70–99)
Glucose-Capillary: 81 mg/dL (ref 70–99)
Glucose-Capillary: 210 mg/dL — ABNORMAL HIGH (ref 70–99)
Glucose-Capillary: 208 mg/dL — ABNORMAL HIGH (ref 70–99)
Glucose-Capillary: 183 mg/dL — ABNORMAL HIGH (ref 70–99)

## 2020-09-28 MED ORDER — INSULIN ASPART 100 UNIT/ML ~~LOC~~ SOLN
4.0000 [IU] | Freq: Three times a day (TID) | SUBCUTANEOUS | Status: DC
  Administered 2020-09-28 – 2020-09-29 (×4): 4 [IU] via SUBCUTANEOUS

## 2020-09-28 NOTE — Progress Notes (Signed)
Physical Therapy Treatment Patient Details Name: Mark [name] MRN: [id number] DOB: 1986-07-08 Today's Date: 09/28/2020    History of Present Illness 34 year old male with history of seizures on Depakote, type 1 diabetes with frequent episodes of DKA, hydrocephalus who presents to the emergency department on 11/12 with altered mental status with CBG in 500s and sonorous breath sounds.    PT Comments    Pt is making significant progress towards his goals this date, with him being more willing and motivated to participate and improve. Pt's roommate present at start of session, stating that they do have 2 other roommates that can assist physically if needed and that they have 4-5 steps to enter home. Pt continues to demonstrate difficulty with sequencing steps and problem-solving how to perform functional mobility and requires single simple step commands throughout. MinA to roll and modA to sit up EOB with only minAx2 to come to stand this date. Due to pt's quadriceps and gluteal muscle strength and endurance deficits he required modAx2 initially but progressed to maxAx2 as he fatigued to take several steps to the side to transfer to chair and to then attempt practicing weight shifting with gait with a RW. Will continue to follow acutely. Current recommendations remain appropriate.    Follow Up Recommendations  Supervision/Assistance - 24 hour;SNF (likely will d/c home though, thus max [facility] services)     Equipment Recommendations  Rolling walker with 5" wheels;3in1 (PT);Wheelchair (measurements PT);Wheelchair cushion (measurements PT);Other (comment);Hospital bed    Recommendations for Other Services       Precautions / Restrictions Precautions Precautions: Fall Restrictions Weight Bearing Restrictions: No    Mobility  Bed Mobility Overal bed mobility: Needs Assistance Bed Mobility: Rolling;Sidelying to Sit Rolling: Min assist Sidelying to sit: Mod assist       General bed mobility  comments: Cues for hand placement on R bed rails to roll to R. Cues provided to manage legs off EOB, with success. Verbal and tactile cues to transition onto R elbow > hand to ascend trunk, but difficulty understanding directions thus modA to ascend trunk.  Transfers Overall transfer level: Needs assistance Equipment used: Rolling walker (2 wheeled) Transfers: Sit to/from [employer] Sit to Stand: Min assist;+2 physical assistance Stand pivot transfers: Mod assist;+2 physical assistance       General transfer comment: Cues for hand placement and feet placement shoulder width apart and posteriorly on floor, with good carryover noted with subsequent reps. Cued pt to extend knees and hips to come to stand, with pt able to quickly push up to stand and transition hands to RW with minAx2 for safety. Slow, single step commands, provided to shift weight and move one leg at a time and to manage RW between steps to step to R to transfer to chair. Pt's trunk would flex and his knees would buckle as time and thus fatigue progressed.  Ambulation/Gait Ambulation/Gait assistance: Mod assist;+2 physical assistance;Max assist Gait Distance (Feet): 2 Feet Assistive device: Rolling walker (2 wheeled) Gait Pattern/deviations: Trunk flexed;Narrow base of support;Decreased weight shift to right;Decreased weight shift to left;Decreased stride length Gait velocity: reduced Gait velocity interpretation: <1.31 ft/sec, indicative of household ambulator General Gait Details: Several side steps to R to transfer to chair with RW and second bout practicing anterior and posterior weight shift with one foot placed anterior to other to simulate walking. ModAx2 initially to provide stability and assist with leg advancement but maxAx2 as time progressed and knees buckled and trunk flexed.   Stairs  Wheelchair Mobility    Modified Rankin (Stroke Patients Only)       Balance Overall  balance assessment: Needs assistance Sitting-balance support: Bilateral upper extremity supported;Feet supported Sitting balance-Leahy Scale: Poor Sitting balance - Comments: UE support and modA initially for static sitting EOB but progressed to minA-min guard assist as pt achieved more midline with elbows resting on knees. Postural control: Posterior lean;Right lateral lean Standing balance support: Bilateral upper extremity supported Standing balance-Leahy Scale: Poor Standing balance comment: Bilat UE support and modAx2 with intermittent knee block for standing balance.                            Cognition Arousal/Alertness: Awake/alert Behavior During Therapy: WFL for tasks assessed/performed;Flat affect Overall Cognitive Status: Impaired/Different from baseline Area of Impairment: Safety/judgement;Awareness;Problem solving                         Safety/Judgement: Decreased awareness of safety;Decreased awareness of deficits Awareness: Emergent Problem Solving: Slow processing;Decreased initiation;Requires tactile cues;Requires verbal cues;Difficulty sequencing General Comments: Required step-by-setp cues to sequence and initiate all tasks, with difficulty understanding cues if not simple.       Exercises      General Comments        Pertinent Vitals/Pain Pain Assessment: Faces Faces Pain Scale: No hurt Pain Intervention(s): Limited activity within patient's tolerance;Monitored during session;Repositioned    Home Living                      Prior Function            PT Goals (current goals can now be found in the care plan section) Acute Rehab PT Goals Patient Stated Goal: to go home PT Goal Formulation: With patient Time For Goal Achievement: 10/07/20 Potential to Achieve Goals: Fair Progress towards PT goals: Progressing toward goals    Frequency    Min 3X/week      PT Plan Current plan remains appropriate     Co-evaluation              AM-PAC PT "6 Clicks" Mobility   Outcome Measure  Help needed turning from your back to your side while in a flat bed without using bedrails?: A Little Help needed moving from lying on your back to sitting on the side of a flat bed without using bedrails?: A Lot Help needed moving to and from a bed to a chair (including a wheelchair)?: A Lot Help needed standing up from a chair using your arms (e.g., wheelchair or bedside chair)?: A Little Help needed to walk in hospital room?: Total Help needed climbing 3-5 steps with a railing? : Total 6 Click Score: 12    End of Session         PT Visit Diagnosis: Other abnormalities of gait and mobility (R26.89);Muscle weakness (generalized) (M62.81);Unsteadiness on feet (R26.81);Difficulty in walking, not elsewhere classified (R26.2)     Time: 9381-8299 PT Time Calculation (min) (ACUTE ONLY): 20 min  Charges:  $Therapeutic Activity: 8-22 mins                     [name], PT, DPT Acute Rehabilitation Services  Pager: [phone] Office: [phone]    Mark [name] 09/28/2020, 2:37 PM

## 2020-09-28 NOTE — Progress Notes (Signed)
[facility] PROGRESS NOTE  Mark [name] SNK:[id number] DOB: [birth year] DOA: 09/02/2020 PCP: Patient, No Pcp Per  Status: Remains inpatient appropriate because:Unsafe d/c plan   Dispo: The patient is from: Home              Anticipated d/c is to: Home (SNF recommended but patient refused)              Anticipated d/c date is: > 3 days              Patient currently is medically stable to d/c.  Barriers to discharge: Patient refusing SNF placement.  Waxing and waning ability to have capacity to make medical decisions.  Very deconditioned and unable to return to home environment due to ambulatory dysfunction.   Code Status: Full Family Communication: Patient DVT prophylaxis: SCDs Vaccination status: No documentation that patient has received Covid vaccination  Foley catheter: No-external male urinary collection device in place   HPI: With history of diabetes type 1, anxiety/depression manifested as bipolar disorder, seizure disorder, hydrocephalus and tobacco use.  He was sent to the ER with altered mental status with seizure like activity and found to be in severe DKA with encephalopathy that required intubation/ventilation from 11/12-11/14.  Subsequent CT head demonstrated persistent marked hydrocephalus assistant with acute aqueductal stenosis.  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  Neurosurgery did not recommend any surgical intervention or placement of new drain.  He was initially treated for aspiration pneumonia with IV Rocephin and vancomycin.  Subsequently patient has been transferred out of the ICU.  PT/OT have recommended CIR but patient does not have insurance.  Medicaid application in process.  SNF recommended for short-term rehab but patient has refused.  He lives with a disabled friend and there are multiple stairs within the home.  Based on current PT and OT evaluations patient is unable to discharge home independently based on  increased mobility needs.  Because of concerns for capacity psychiatry was consulted.  It was documented that his insight and judgment as continued to fluctuate throughout this hospitalization and thus was deemed lacking of capacity to make medical decisions.  He did not meet criteria for inpatient behavioral health treatment though.  Plan is to focus on aggressive PT and OT and allow patient to discharge to home environment once physically stable.  Subjective: Awakened.  Continues with flat affect but more interactive today.  Continues to have trouble with fecal incontinence per self-report but is utilizing urinal without difficulty.  Denies pain or other issues.  Reports improvement in dizziness with position changes.  Objective: Vitals:   09/28/20 0347 09/28/20 0729  BP: 107/73 105/75  Pulse: 83 75  Resp: 18 18  Temp: 97.7 F (36.5 C) 97.7 F (36.5 C)  SpO2: 97% 98%    Intake/Output Summary (Last 24 hours) at 09/28/2020 1328 Last data filed at 09/28/2020 0544 Gross per 24 hour  Intake 240 ml  Output 650 ml  Net -410 ml   Filed Weights   09/07/20 0500 09/08/20 0500 09/09/20 0453  Weight: 103 kg 102.2 kg 97.4 kg    Exam:  Constitutional: NAD, calm, comfortable Respiratory: clear to auscultation bilaterally no wheezing or rhonchi.  Normal respiratory effort.  Room air Cardiovascular: Regular rate and rhythm, No extremity edema.  Appropriate capillary refill Abdomen: no tenderness, tolerating diet.  Bowel sounds positive.  Musculoskeletal: no clubbing / cyanosis. No joint deformity upper and lower extremities. Good ROM, no contractures.  12/6 was  noted with truncal instability with attempts to sit on EOB.  Neurologic: CN 2-12 grossly intact. Sensation intact, Strength 5/5 x upper extremities and 4/5 lower extremities extremities. Psychiatric:  Alert and oriented x 3. Normal mood.    Assessment/Plan: Acute problems: Severe physical deconditioning with associated ambulatory  dysfunction -No focal neurological deficits only generalized weakness of lower extremities -Continues to require max assist with therapies and at this juncture SNF continues to be recommended by therapy providers.  Patient has multiple stairs in the home he shares with a friend and would need to be able to navigate stairs before can safely discharge home. -PT/OT schedule 4x per week -Has difficulty participating with PT due to frustration over lack of progress.  Suspect his bipolar disorder also contributing and as needed dose of Seroquel during the day begins to take effect hopefully participation with PT/OT will improve  Fever -T-max 101.6 F 48 hours ago with no recurrence -Chest x-ray unremarkable.  Blood cultures pending  Acute metabolic encephalopathy/Capacity concerns/bipolar disorder -Presented with acute encephalopathy likely related to DKA, hypoxia and aspiration pneumonitis -Continues to have waxing and waning issues regarding capacity per psychiatry team (reevaluated again on 12/3): documented that he lacks insight and is unable to link because of relationships.  Evidence is based on his inability to weigh current physical limitations, remains limitations and need for physical therapy services.  I suspect this is patient's chronic baseline from his ongoing bipolar disorder.  Of note did not meet criteria for inpatient hospitalization -Will need outpatient APS follow-up; it appears patient had previously been enrolled in Medicaid but has been unable to consistently complete required follow-up paperwork to remain enrolled and will need assistance with this in the future -Continue Seroquel, Depakote and Lexapro (max dose) -IM Haldol prn for agitation -Melatonin at HS for insomnia symptoms; Seroquel from increased 300 to 400 mg at bedtime last week.  Continue Seroquel 50 mg during the day  Uncontrolled diabetes type 1 with hyperglycemia and presentation with DKA, hypoglycemic episodes since  admission -Brittle diabetes-Hemoglobin A1c 8.2 -Continue NPH insulin 25 units BID -12/8 we will add meal coverage 4 units 3 times daily -Continue SSI -Current CBGs have been between 68 and 182 therefore will allow for degree of mild permissive hyperglycemia; could conceivably give meal coverage as long as eats 50% of meals but patient will be unable to manage SSI after discharge  Chronic hydrocephalus/?history of seizure disorder -Persistent changes of hydrocephalus involving the third and lateral ventricles with normal fourth ventricle assistant with aqueductal stenosis -Neurosurgery evaluated during admission and no indications for surgical intervention -There was also a question of possible seizure-like activity at presentation but was evaluated by neurology earlier in the hospitalization who determined he was not actively seizing -Continue Depakote  Dysphagia -Stable on D3 diet  Nutrition Status: Nutrition Problem: Inadequate oral intake-RESOLVED-now eating 100% of meals Etiology: inability to eat-resolved Signs/Symptoms: NPO status-now on D3 diet with Glucerna supplements, oral vitamin B6 replacement Interventions: Refer to RD note for recommendations     Other problems: Electrolyte abnormalities (Low K/Mg/Phos) -Labs have been stable after patient given replacement -Primarily related to presentation with DKA  Dyslipidemia -Continue Lipitor  Hypothyroidism with elevated TSH -TSH 14.4 (was 18.4 in May) -Noncompliant suspected although could be taking Synthroid with food which will inhibit correct absorption -Continue Synthroid 50 mcg daily -Repeat TSH mid December 2021  Anemia of chronic disease -Hemoglobin has remained stable with last reading 11.8 on 11/21   Data Reviewed: Basic Metabolic Panel: Recent  Labs  Lab 09/24/20 0205 09/26/20 1009  NA 136 139  K 3.9 4.1  CL 98 99  CO2 27 28  GLUCOSE 270* 153*  BUN 15 15  CREATININE 0.87 0.94  CALCIUM 9.6 9.7    Liver Function Tests: Recent Labs  Lab 09/26/20 1009  AST 18  ALT 16  ALKPHOS 71  BILITOT 0.6  PROT 7.2  ALBUMIN 3.6   No results for input(s): LIPASE, AMYLASE in the last 168 hours. No results for input(s): AMMONIA in the last 168 hours. CBC: Recent Labs  Lab 09/26/20 1009  WBC 8.2  NEUTROABS 5.1  HGB 14.3  HCT 41.4  MCV 88.5  PLT 373   Cardiac Enzymes: No results for input(s): CKTOTAL, CKMB, CKMBINDEX, TROPONINI in the last 168 hours. BNP (last 3 results) No results for input(s): BNP in the last 8760 hours.  ProBNP (last 3 results) No results for input(s): PROBNP in the last 8760 hours.  CBG: Recent Labs  Lab 09/27/20 1253 09/27/20 1557 09/27/20 2144 09/28/20 0620 09/28/20 1154  GLUCAP 204* 206* 343* 138* 81    Recent Results (from the past 240 hour(s))  Culture, blood (Routine X 2) w Reflex to ID Panel     Status: None (Preliminary result)   Collection Time: 09/26/20 10:09 AM   Specimen: BLOOD  Result Value Ref Range Status   Specimen Description BLOOD RIGHT ANTECUBITAL  Final   Special Requests   Final    BOTTLES DRAWN AEROBIC AND ANAEROBIC Blood Culture adequate volume   Culture   Final    NO GROWTH 2 DAYS Performed at [facility], [address] [place], [place] [address]    Report Status PENDING  Incomplete  Culture, blood (Routine X 2) w Reflex to ID Panel     Status: None (Preliminary result)   Collection Time: 09/26/20 10:19 AM   Specimen: BLOOD  Result Value Ref Range Status   Specimen Description BLOOD LEFT ANTECUBITAL  Final   Special Requests   Final    BOTTLES DRAWN AEROBIC AND ANAEROBIC Blood Culture results may not be optimal due to an inadequate volume of blood received in culture bottles   Culture   Final    NO GROWTH 2 DAYS Performed at [facility], [address] [place], [place] [address]    Report Status PENDING  Incomplete     Studies: No results found.  Scheduled Meds: . atorvastatin  20 mg Oral  Daily  . Chlorhexidine Gluconate Cloth  6 each Topical Daily  . divalproex  500 mg Oral Daily  . divalproex  750 mg Oral QPM  . escitalopram  20 mg Oral Daily  . feeding supplement (GLUCERNA SHAKE)  237 mL Oral TID BM  . insulin aspart  0-5 Units Subcutaneous QHS  . insulin aspart  0-9 Units Subcutaneous TID WC  . insulin NPH Human  25 Units Subcutaneous BID AC & HS  . levothyroxine  50 mcg Oral Q0600  . melatonin  6 mg Oral QHS  . pantoprazole  40 mg Oral Q1200  . vitamin B-6  50 mg Oral Daily  . QUEtiapine  400 mg Oral QHS  . QUEtiapine  50 mg Oral q morning - 10a   Continuous Infusions:  Principal Problem:   AMS (altered mental status) Active Problems:   Diabetic keto-acidosis (HCC)   Obstructive hydrocephalus (HCC)   Bipolar I disorder, most recent episode depressed, severe without psychotic features (HCC)   Acute metabolic encephalopathy  Bipolar I disorder (HCC)   Uncontrolled type 1 diabetes mellitus with hyperglycemia (HCC)   Physical deconditioning   Ambulatory dysfunction   Insomnia due to mental disorder   Fever   Consultants: Neurology Neurosurgery PCCM Psychiatry  Procedures:  Intubation/extubation  EEG  MBSS  Antibiotics: Anti-infectives (From admission, onward)   Start     Dose/Rate Route Frequency Ordered Stop   09/03/20 0945  cefTRIAXone (ROCEPHIN) 2 g in sodium chloride 0.9 % 100 mL IVPB        2 g 200 mL/hr over 30 Minutes Intravenous Every 24 hours 09/03/20 0854 09/06/20 1126   09/03/20 0800  ceFEPIme (MAXIPIME) 2 g in sodium chloride 0.9 % 100 mL IVPB  Status:  Discontinued        2 g 200 mL/hr over 30 Minutes Intravenous Every 24 hours 09/02/20 0742 09/02/20 1331   09/02/20 2200  vancomycin (VANCOREADY) IVPB 750 mg/150 mL  Status:  Discontinued        750 mg 150 mL/hr over 60 Minutes Intravenous Every 12 hours 09/02/20 1329 09/03/20 0854   09/02/20 1800  ceFEPIme (MAXIPIME) 2 g in sodium chloride 0.9 % 100 mL IVPB  Status:   Discontinued        2 g 200 mL/hr over 30 Minutes Intravenous Every 8 hours 09/02/20 1331 09/03/20 0854   09/02/20 0745  ceFEPIme (MAXIPIME) 2 g in sodium chloride 0.9 % 100 mL IVPB        2 g 200 mL/hr over 30 Minutes Intravenous  Once 09/02/20 0732 09/02/20 0932   09/02/20 0745  vancomycin (VANCOREADY) IVPB 2000 mg/400 mL        2,000 mg 200 mL/hr over 120 Minutes Intravenous  Once 09/02/20 0733 09/02/20 1257   09/02/20 0741  vancomycin variable dose per unstable renal function (pharmacist dosing)  Status:  Discontinued         Does not apply See admin instructions 09/02/20 0742 09/02/20 1329   09/02/20 0730  aztreonam (AZACTAM) 2 g in sodium chloride 0.9 % 100 mL IVPB  Status:  Discontinued        2 g 200 mL/hr over 30 Minutes Intravenous  Once 09/02/20 0728 09/02/20 0732   09/02/20 0730  metroNIDAZOLE (FLAGYL) IVPB 500 mg        500 mg 100 mL/hr over 60 Minutes Intravenous  Once 09/02/20 0728 09/02/20 1040   09/02/20 0730  vancomycin (VANCOCIN) IVPB 1000 mg/200 mL premix  Status:  Discontinued        1,000 mg 200 mL/hr over 60 Minutes Intravenous  Once 09/02/20 0728 09/02/20 0733       Time spent: 20 minutes    Junious Silk ANP  [facility] Pager [phone].

## 2020-09-28 NOTE — Plan of Care (Signed)
  Problem: Health Behavior/Discharge Planning: Goal: Ability to manage health-related needs will improve Outcome: Progressing   Problem: Clinical Measurements: Goal: Ability to maintain clinical measurements within normal limits will improve Outcome: Progressing Goal: Will remain free from infection Outcome: Progressing Goal: Diagnostic test results will improve Outcome: Progressing Goal: Respiratory complications will improve Outcome: Progressing Goal: Cardiovascular complication will be avoided Outcome: Progressing   Problem: Nutrition: Goal: Adequate nutrition will be maintained Outcome: Progressing   Problem: Elimination: Goal: Will not experience complications related to bowel motility Outcome: Progressing Goal: Will not experience complications related to urinary retention Outcome: Progressing   Problem: Education: Goal: Knowledge of General Education information will improve Description: Including pain rating scale, medication(s)/side effects and non-pharmacologic comfort measures Outcome: Progressing   Problem: Health Behavior/Discharge Planning: Goal: Ability to manage health-related needs will improve Outcome: Progressing   Problem: Clinical Measurements: Goal: Ability to maintain clinical measurements within normal limits will improve Outcome: Progressing Goal: Will remain free from infection Outcome: Progressing Goal: Diagnostic test results will improve Outcome: Progressing Goal: Respiratory complications will improve Outcome: Progressing Goal: Cardiovascular complication will be avoided Outcome: Progressing   Problem: Activity: Goal: Risk for activity intolerance will decrease Outcome: Progressing   Problem: Nutrition: Goal: Adequate nutrition will be maintained Outcome: Progressing   Problem: Coping: Goal: Level of anxiety will decrease Outcome: Progressing   Problem: Elimination: Goal: Will not experience complications related to bowel  motility Outcome: Progressing Goal: Will not experience complications related to urinary retention Outcome: Progressing   Problem: Pain Managment: Goal: General experience of comfort will improve Outcome: Progressing   Problem: Safety: Goal: Ability to remain free from injury will improve Outcome: Progressing   Problem: Skin Integrity: Goal: Risk for impaired skin integrity will decrease Outcome: Progressing

## 2020-09-28 NOTE — Plan of Care (Signed)
Max assist with mobility and adls, uncooperative

## 2020-09-28 NOTE — Progress Notes (Signed)
Inpatient Diabetes Program Recommendations  AACE/ADA: New Consensus Statement on Inpatient Glycemic Control   Target Ranges:  Prepandial:   less than 140 mg/dL      Peak postprandial:   less than 180 mg/dL (1-2 hours)      Critically ill patients:  140 - 180 mg/dL   Results for Mark, [name] (MRN [id number]) as of 09/28/2020 09:02  Ref. Range 09/27/2020 06:46 09/27/2020 09:30 09/27/2020 12:53 09/27/2020 15:57 09/27/2020 21:44  Glucose-Capillary Latest Ref Range: 70 - 99 mg/dL 413 (H) 244 (H) 010 (H) 206 (H) 343 (H)   Review of Glycemic Control  Current orders for Inpatient glycemic control: NPH 25 units BID, Novolog 0-9 units TID with meals, Novolog 0-5 units QHS  Inpatient Diabetes Program Recommendations:    Insulin: Post prandial glucose consistently elevated. Please consider ordering Novolog 4 units TID with meals for meal coverage if patient eats at least 50% of meals.  Thanks, [name], RN, MSN, CDE Diabetes Coordinator Inpatient Diabetes Program [phone] (Team Pager from 8am to 5pm)

## 2020-09-29 LAB — GLUCOSE, CAPILLARY
Glucose-Capillary: 119 mg/dL — ABNORMAL HIGH (ref 70–99)
Glucose-Capillary: 133 mg/dL — ABNORMAL HIGH (ref 70–99)
Glucose-Capillary: 235 mg/dL — ABNORMAL HIGH (ref 70–99)
Glucose-Capillary: 411 mg/dL — ABNORMAL HIGH (ref 70–99)
Glucose-Capillary: 447 mg/dL — ABNORMAL HIGH (ref 70–99)

## 2020-09-29 NOTE — Progress Notes (Signed)
[facility] PROGRESS NOTE  Derek JackMichael Q Rease XNA:[id number] DOB: 01/12/1986 DOA: 09/02/2020 PCP: Patient, No Pcp Per  Status: Remains inpatient appropriate because:Unsafe d/c plan   Dispo: The patient is from: Home              Anticipated d/c is to: Home (SNF recommended but patient refused)              Anticipated d/c date is: > 3 days              Patient currently is medically stable to d/c.  Barriers to discharge: Patient refusing SNF placement.  Waxing and waning ability to have capacity to make medical decisions.  Very deconditioned and unable to return to home environment due to ambulatory dysfunction.   Code Status: Full Family Communication: Patient DVT prophylaxis: SCDs Vaccination status: No documentation that patient has received Covid vaccination  Foley catheter: No-external male urinary collection device in place   HPI: With history of diabetes type 1, anxiety/depression manifested as bipolar disorder, seizure disorder, hydrocephalus and tobacco use.  He was sent to the ER with altered mental status with seizure like activity and found to be in severe DKA with encephalopathy that required intubation/ventilation from 11/12-11/14.  Subsequent CT head demonstrated persistent marked hydrocephalus assistant with acute aqueductal stenosis.  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  Neurosurgery did not recommend any surgical intervention or placement of new drain.  He was initially treated for aspiration pneumonia with IV Rocephin and vancomycin.  Subsequently patient has been transferred out of the ICU.  PT/OT have recommended CIR but patient does not have insurance.  Medicaid application in process.  SNF recommended for short-term rehab but patient has refused.  He lives with a disabled friend and there are multiple stairs within the home.  Based on current PT and OT evaluations patient is unable to discharge home independently based on  increased mobility needs.  Because of concerns for capacity psychiatry was consulted.  It was documented that his insight and judgment as continued to fluctuate throughout this hospitalization and thus was deemed lacking of capacity to make medical decisions.  He did not meet criteria for inpatient behavioral health treatment though.  Plan is to focus on aggressive PT and OT and allow patient to discharge to home environment once physically stable.  Subjective: Awake.  Was very pleasant during my encounter with him earlier this morning.  Encouraged that he is improving with PT and verbalizes understanding as to why he is still not yet ready to go home later in the day nursing staff documented that patient was uncooperative and requiring max assist with mobility and ADLs  Objective: Vitals:   09/29/20 0902 09/29/20 1114  BP: 102/75 102/71  Pulse: 79 86  Resp: 18 18  Temp: 98.5 F (36.9 C) 98.1 F (36.7 C)  SpO2: 98% 98%    Intake/Output Summary (Last 24 hours) at 09/29/2020 1253 Last data filed at 09/29/2020 1100 Gross per 24 hour  Intake --  Output 950 ml  Net -950 ml   Filed Weights   09/07/20 0500 09/08/20 0500 09/09/20 0453  Weight: 103 kg 102.2 kg 97.4 kg    Exam:  Constitutional: NAD, calm, comfortable Respiratory: clear to auscultation bilaterally no wheezing or rhonchi.  Normal respiratory effort.  Room air Cardiovascular: Regular rate and rhythm, No extremity edema.  Appropriate capillary refill Abdomen: no tenderness, tolerating diet.  Bowel sounds positive.  Musculoskeletal: no clubbing / cyanosis.  No joint deformity upper and lower extremities. Good ROM, no contractures.  12/6 was noted with truncal instability with attempts to sit on EOB.  Neurologic: CN 2-12 grossly intact. Sensation intact, Strength 5/5 x upper extremities and 4/5 lower extremities extremities. Psychiatric:  Alert and oriented x 3. Normal mood.    Assessment/Plan: Acute problems: Severe physical  deconditioning with associated ambulatory dysfunction -No focal neurological deficits only generalized weakness of lower extremities -Continues to require max assist with therapies and at this juncture SNF continues to be recommended by therapy providers.  Patient has multiple stairs in the home he shares with a friend and would need to be able to navigate stairs before can safely discharge home. -PT/OT schedule 4x per week-as of 12/8 PT documented patient making significant progress towards his goals.  He appears to be more willing and motivated to participate and improve.  His roommates are present at the start of the session and it is reported that they can assist physically if needed but patient would definitely need to be able to navigate the 4-5 steps to enter the home. -Has difficulty participating with PT due to frustration over lack of progress.  Suspect his bipolar disorder also contributing and as needed dose of Seroquel during the day begins to take effect hopefully participation with PT/OT will improve  Fever -T-max 101.6 F 48 hours ago with no recurrence -Chest x-ray unremarkable.  Blood cultures pending  Acute metabolic encephalopathy/Capacity concerns/bipolar disorder -Presented with acute encephalopathy likely related to DKA, hypoxia and aspiration pneumonitis -Continues to have waxing and waning issues regarding capacity per psychiatry team (reevaluated again on 12/3): documented that he lacks insight and is unable to link because of relationships.  Evidence is based on his inability to weigh current physical limitations, remains limitations and need for physical therapy services.  I suspect this is patient's chronic baseline from his ongoing bipolar disorder.  Of note did not meet criteria for inpatient hospitalization -Will need outpatient APS follow-up; it appears patient had previously been enrolled in Medicaid but has been unable to consistently complete required follow-up  paperwork to remain enrolled and will need assistance with this in the future -Continue Seroquel, Depakote and Lexapro (max dose) -IM Haldol prn for agitation -Melatonin at HS for insomnia symptoms; Seroquel from increased 300 to 400 mg at bedtime last week.  Continue Seroquel 50 mg during the day  Uncontrolled diabetes type 1 with hyperglycemia and presentation with DKA, hypoglycemic episodes since admission -Brittle diabetes-Hemoglobin A1c 8.2 -Continue NPH insulin 25 units BID -Continue meal coverage 4 units 3 times daily -Continue SSI -Current CBGs have been between 68 and 182 therefore will allow for degree of mild permissive hyperglycemia; could conceivably give meal coverage as long as eats 50% of meals but patient will be unable to manage SSI after discharge  Chronic hydrocephalus/?history of seizure disorder -Persistent changes of hydrocephalus involving the third and lateral ventricles with normal fourth ventricle assistant with aqueductal stenosis -Neurosurgery evaluated during admission and no indications for surgical intervention -There was also a question of possible seizure-like activity at presentation but was evaluated by neurology earlier in the hospitalization who determined he was not actively seizing -Continue Depakote  Dysphagia -Stable on D3 diet  Nutrition Status: Nutrition Problem: Inadequate oral intake-RESOLVED-now eating 100% of meals Etiology: inability to eat-resolved Signs/Symptoms: NPO status-now on D3 diet with Glucerna supplements, oral vitamin B6 replacement Interventions: Refer to RD note for recommendations     Other problems: Electrolyte abnormalities (Low K/Mg/Phos) -Labs have  been stable after patient given replacement -Primarily related to presentation with DKA  Dyslipidemia -Continue Lipitor  Hypothyroidism with elevated TSH -TSH 14.4 (was 18.4 in May) -Noncompliant suspected although could be taking Synthroid with food which will  inhibit correct absorption -Continue Synthroid 50 mcg daily -Repeat TSH mid December 2021  Anemia of chronic disease -Hemoglobin has remained stable with last reading 11.8 on 11/21   Data Reviewed: Basic Metabolic Panel: Recent Labs  Lab 09/24/20 0205 09/26/20 1009  NA 136 139  K 3.9 4.1  CL 98 99  CO2 27 28  GLUCOSE 270* 153*  BUN 15 15  CREATININE 0.87 0.94  CALCIUM 9.6 9.7   Liver Function Tests: Recent Labs  Lab 09/26/20 1009  AST 18  ALT 16  ALKPHOS 71  BILITOT 0.6  PROT 7.2  ALBUMIN 3.6   No results for input(s): LIPASE, AMYLASE in the last 168 hours. No results for input(s): AMMONIA in the last 168 hours. CBC: Recent Labs  Lab 09/26/20 1009  WBC 8.2  NEUTROABS 5.1  HGB 14.3  HCT 41.4  MCV 88.5  PLT 373   Cardiac Enzymes: No results for input(s): CKTOTAL, CKMB, CKMBINDEX, TROPONINI in the last 168 hours. BNP (last 3 results) No results for input(s): BNP in the last 8760 hours.  ProBNP (last 3 results) No results for input(s): PROBNP in the last 8760 hours.  CBG: Recent Labs  Lab 09/28/20 1459 09/28/20 1641 09/28/20 2122 09/29/20 0609 09/29/20 1114  GLUCAP 210* 208* 183* 119* 133*    Recent Results (from the past 240 hour(s))  Culture, blood (Routine X 2) w Reflex to ID Panel     Status: None (Preliminary result)   Collection Time: 09/26/20 10:09 AM   Specimen: BLOOD  Result Value Ref Range Status   Specimen Description BLOOD RIGHT ANTECUBITAL  Final   Special Requests   Final    BOTTLES DRAWN AEROBIC AND ANAEROBIC Blood Culture adequate volume   Culture   Final    NO GROWTH 3 DAYS Performed at [facility], [address] [place], [place] [address]    Report Status PENDING  Incomplete  Culture, blood (Routine X 2) w Reflex to ID Panel     Status: None (Preliminary result)   Collection Time: 09/26/20 10:19 AM   Specimen: BLOOD  Result Value Ref Range Status   Specimen Description BLOOD LEFT ANTECUBITAL  Final   Special  Requests   Final    BOTTLES DRAWN AEROBIC AND ANAEROBIC Blood Culture results may not be optimal due to an inadequate volume of blood received in culture bottles   Culture   Final    NO GROWTH 3 DAYS Performed at [facility], [address], [place], [place] [address]    Report Status PENDING  Incomplete     Studies: No results found.  Scheduled Meds: . atorvastatin  20 mg Oral Daily  . Chlorhexidine Gluconate Cloth  6 each Topical Daily  . divalproex  500 mg Oral Daily  . divalproex  750 mg Oral QPM  . escitalopram  20 mg Oral Daily  . feeding supplement (GLUCERNA SHAKE)  237 mL Oral TID BM  . insulin aspart  0-5 Units Subcutaneous QHS  . insulin aspart  0-9 Units Subcutaneous TID WC  . insulin aspart  4 Units Subcutaneous TID WC  . insulin NPH Human  25 Units Subcutaneous BID AC & HS  . levothyroxine  50 mcg Oral Q0600  . melatonin  6  mg Oral QHS  . pantoprazole  40 mg Oral Q1200  . vitamin B-6  50 mg Oral Daily  . QUEtiapine  400 mg Oral QHS  . QUEtiapine  50 mg Oral q morning - 10a   Continuous Infusions:  Principal Problem:   AMS (altered mental status) Active Problems:   Diabetic keto-acidosis (HCC)   Obstructive hydrocephalus (HCC)   Bipolar I disorder, most recent episode depressed, severe without psychotic features (HCC)   Acute metabolic encephalopathy   Bipolar I disorder (HCC)   Uncontrolled type 1 diabetes mellitus with hyperglycemia (HCC)   Physical deconditioning   Ambulatory dysfunction   Insomnia due to mental disorder   Fever   Consultants: Neurology Neurosurgery PCCM Psychiatry  Procedures:  Intubation/extubation  EEG  MBSS  Antibiotics: Anti-infectives (From admission, onward)   Start     Dose/Rate Route Frequency Ordered Stop   09/03/20 0945  cefTRIAXone (ROCEPHIN) 2 g in sodium chloride 0.9 % 100 mL IVPB        2 g 200 mL/hr over 30 Minutes Intravenous Every 24 hours 09/03/20 0854 09/06/20 1126   09/03/20 0800  ceFEPIme  (MAXIPIME) 2 g in sodium chloride 0.9 % 100 mL IVPB  Status:  Discontinued        2 g 200 mL/hr over 30 Minutes Intravenous Every 24 hours 09/02/20 0742 09/02/20 1331   09/02/20 2200  vancomycin (VANCOREADY) IVPB 750 mg/150 mL  Status:  Discontinued        750 mg 150 mL/hr over 60 Minutes Intravenous Every 12 hours 09/02/20 1329 09/03/20 0854   09/02/20 1800  ceFEPIme (MAXIPIME) 2 g in sodium chloride 0.9 % 100 mL IVPB  Status:  Discontinued        2 g 200 mL/hr over 30 Minutes Intravenous Every 8 hours 09/02/20 1331 09/03/20 0854   09/02/20 0745  ceFEPIme (MAXIPIME) 2 g in sodium chloride 0.9 % 100 mL IVPB        2 g 200 mL/hr over 30 Minutes Intravenous  Once 09/02/20 0732 09/02/20 0932   09/02/20 0745  vancomycin (VANCOREADY) IVPB 2000 mg/400 mL        2,000 mg 200 mL/hr over 120 Minutes Intravenous  Once 09/02/20 0733 09/02/20 1257   09/02/20 0741  vancomycin variable dose per unstable renal function (pharmacist dosing)  Status:  Discontinued         Does not apply See admin instructions 09/02/20 0742 09/02/20 1329   09/02/20 0730  aztreonam (AZACTAM) 2 g in sodium chloride 0.9 % 100 mL IVPB  Status:  Discontinued        2 g 200 mL/hr over 30 Minutes Intravenous  Once 09/02/20 0728 09/02/20 0732   09/02/20 0730  metroNIDAZOLE (FLAGYL) IVPB 500 mg        500 mg 100 mL/hr over 60 Minutes Intravenous  Once 09/02/20 0728 09/02/20 1040   09/02/20 0730  vancomycin (VANCOCIN) IVPB 1000 mg/200 mL premix  Status:  Discontinued        1,000 mg 200 mL/hr over 60 Minutes Intravenous  Once 09/02/20 0728 09/02/20 0733       Time spent: 20 minutes    Junious Silk ANP  [facility] Pager [phone].

## 2020-09-29 NOTE — Progress Notes (Signed)
Occupational Therapy Treatment Patient Details Name: Mark [name] MRN: [id number] DOB: 1986/03/05 Today's Date: 09/29/2020    History of present illness 34 year old male with history of seizures on Depakote, type 1 diabetes with frequent episodes of DKA, hydrocephalus who presents to the emergency department on 11/12 with altered mental status with CBG in 500s and sonorous breath sounds.   OT comments  OT treatment session with focus on bed mobility, static/dynamic sitting balance, ADL transfers and d/c planning. Patient more cooperative with therapy efforts this date but continues to express concerns about CLOF although patient still fully unaware of the extent of his deficits despite education and repeat reminders. Patient states that he would like to have longer therapy sessions but at this time fatigues very quickly and can only tolerate up to 15 minutes of therapy at a time with rest breaks. Patient would benefit from continued acute OT services in prep for safe d/c to next level of care with continued recommendation for SNF rehab. If patient is to d/c home, recommendation for [facility] therapies, 24hr supervision/assist and DME listed below.   Follow Up Recommendations  SNF    Equipment Recommendations  3 in 1 bedside commode;Tub/shower bench    Recommendations for Other Services Rehab consult    Precautions / Restrictions Precautions Precautions: Fall Restrictions Weight Bearing Restrictions: No       Mobility Bed Mobility Overal bed mobility: Needs Assistance Bed Mobility: Rolling;Sidelying to Sit Rolling: Min assist Sidelying to sit: Mod assist       General bed mobility comments: Cues for hand placement and technique for rolling.  Transfers Overall transfer level: Needs assistance Equipment used: Rolling walker (2 wheeled) Transfers: Sit to/from [employer] Sit to Stand: Min assist;+2 physical assistance Stand pivot transfers: Mod assist;+2 physical  assistance       General transfer comment: Cues for hand/foot placement, upright posture, and sequence of steps.    Balance   Sitting-balance support: Bilateral upper extremity supported;Feet supported Sitting balance-Leahy Scale: Poor Sitting balance - Comments: Min to Mod A to maintain static/dynamic sitting balance. Patient able to shift weight to bilateral elbows and back to midline with Min A. Postural control: Posterior lean Standing balance support: Bilateral upper extremity supported Standing balance-Leahy Scale: Poor Standing balance comment: BUE on RW and external assist to maintain standing balance.                           ADL either performed or assessed with clinical judgement   ADL                           Toilet Transfer: Moderate assistance;+2 for physical assistance;RW Toilet Transfer Details (indicate cue type and reason): Simulated with transer to recliner. Patient with difficulty taking steps requiring cues for upright posture and foot placement.           General ADL Comments: Patient much more pleasant this date compared to previous OT sessions with willingness to participate.     Vision       Perception     Praxis      Cognition Arousal/Alertness: Awake/alert Behavior During Therapy: WFL for tasks assessed/performed;Flat affect Overall Cognitive Status: Impaired/Different from baseline Area of Impairment: Safety/judgement;Awareness;Problem solving                         Safety/Judgement: Decreased awareness of safety;Decreased awareness of deficits Awareness:  Emergent Problem Solving: Slow processing;Decreased initiation;Requires tactile cues;Requires verbal cues;Difficulty sequencing General Comments: Patient with decreased understanding of current deficits and process of rehab.        Exercises Exercises: Other exercises General Exercises - Lower Extremity Ankle Circles/Pumps: AROM;Both;10  reps;Seated   Shoulder Instructions       General Comments      Pertinent Vitals/ Pain       Pain Assessment: No/denies pain  Home Living                                          Prior Functioning/Environment              Frequency  Min 2X/week        Progress Toward Goals  OT Goals(current goals can now be found in the care plan section)  Progress towards OT goals: Progressing toward goals  Acute Rehab OT Goals Patient Stated Goal: To return home. OT Goal Formulation: With patient Time For Goal Achievement: 10/07/20 Potential to Achieve Goals: Fair ADL Goals Pt Will Perform Grooming: with supervision;sitting Pt Will Perform Lower Body Bathing: with min assist;sit to/from stand Pt Will Perform Upper Body Dressing: with supervision;sitting Pt Will Perform Lower Body Dressing: with min assist;sit to/from stand Pt Will Transfer to Toilet: with min assist;stand pivot transfer;bedside commode Pt/caregiver will Perform Home Exercise Program: Increased strength;Increased ROM;Both right and left upper extremity;With minimal assist;With written HEP provided Additional ADL Goal #1: Pt will maintain static sitting balance with minguard assist > 10 min as precursor to ADL. Additional ADL Goal #2: Pt will follow multi step commands with 75% accuracy and no more than min cues.  Plan Discharge plan remains appropriate;Frequency remains appropriate    Co-evaluation                 AM-PAC OT "6 Clicks" Daily Activity     Outcome Measure   Help from another person eating meals?: None Help from another person taking care of personal grooming?: A Little Help from another person toileting, which includes using toliet, bedpan, or urinal?: A Lot Help from another person bathing (including washing, rinsing, drying)?: A Lot Help from another person to put on and taking off regular upper body clothing?: A Little Help from another person to put on and taking  off regular lower body clothing?: A Lot 6 Click Score: 16    End of Session Equipment Utilized During Treatment: Gait belt;Rolling walker  OT Visit Diagnosis: Other abnormalities of gait and mobility (R26.89);Muscle weakness (generalized) (M62.81);Unsteadiness on feet (R26.81);Other symptoms and signs involving cognitive function   Activity Tolerance Patient tolerated treatment well   Patient Left in chair;with call bell/phone within reach;with chair alarm set   Nurse Communication Mobility status        Time: 4098-1191 OT Time Calculation (min): 16 min  Charges: OT General Charges $OT Visit: 1 Visit OT Treatments $Self Care/Home Management : 23-37 mins  Kataleya Zaugg H. OTR/L Supplemental OT, Department of rehab services [phone]   Choua Ikner R H. 09/29/2020, 1:36 PM

## 2020-09-29 NOTE — Progress Notes (Signed)
Patient in 3W Room 13 blood sugar is 447. Paged doctor and waiting for response.

## 2020-09-29 NOTE — Plan of Care (Signed)
Max assist with bed chair transfer, poor safety awareness.

## 2020-09-29 NOTE — Progress Notes (Signed)
NOTIFIED ATTENDING MD OF INTENT TO LEAVE AMA. SECURITY CALLED, ARRIVED AT 1615. PT NONVIOLENT. UNABLE TO FULLY CLOTHE HIMSELF WITH PERSONAL BELONGINGS, UNABLE TO STAND AT BEDSIDE.  PT REMAINS IN ROOM, IN BED AT THIS HOUR .

## 2020-09-30 ENCOUNTER — Other Ambulatory Visit (HOSPITAL_COMMUNITY): Payer: Self-pay | Admitting: Nurse Practitioner

## 2020-09-30 DIAGNOSIS — F314 Bipolar disorder, current episode depressed, severe, without psychotic features: Secondary | ICD-10-CM

## 2020-09-30 DIAGNOSIS — E1011 Type 1 diabetes mellitus with ketoacidosis with coma: Secondary | ICD-10-CM

## 2020-09-30 DIAGNOSIS — G9341 Metabolic encephalopathy: Secondary | ICD-10-CM

## 2020-09-30 LAB — GLUCOSE, CAPILLARY
Glucose-Capillary: 393 mg/dL — ABNORMAL HIGH (ref 70–99)
Glucose-Capillary: 376 mg/dL — ABNORMAL HIGH (ref 70–99)
Glucose-Capillary: 271 mg/dL — ABNORMAL HIGH (ref 70–99)
Glucose-Capillary: 45 mg/dL — ABNORMAL LOW (ref 70–99)
Glucose-Capillary: 75 mg/dL (ref 70–99)
Glucose-Capillary: 334 mg/dL — ABNORMAL HIGH (ref 70–99)
Glucose-Capillary: 279 mg/dL — ABNORMAL HIGH (ref 70–99)

## 2020-09-30 MED ORDER — DIVALPROEX SODIUM 500 MG PO DR TAB: 500.0000 mg | DELAYED_RELEASE_TABLET | Freq: Every day | ORAL | 6 refills | Status: DC

## 2020-09-30 MED ORDER — ESCITALOPRAM OXALATE 20 MG PO TABS: 20.0000 mg | ORAL_TABLET | Freq: Every day | ORAL | 1 refills | Status: DC

## 2020-09-30 MED ORDER — ATORVASTATIN CALCIUM 20 MG PO TABS: 20.0000 mg | ORAL_TABLET | Freq: Every day | ORAL | 6 refills | Status: DC

## 2020-09-30 MED ORDER — ZIPRASIDONE MESYLATE 20 MG IM SOLR
20.0000 mg | INTRAMUSCULAR | Status: DC
  Filled 2020-09-30: qty 20

## 2020-09-30 MED ORDER — GABAPENTIN 100 MG PO CAPS: 100.0000 mg | ORAL_CAPSULE | Freq: Two times a day (BID) | ORAL | 6 refills | Status: DC

## 2020-09-30 MED ORDER — ZIPRASIDONE MESYLATE 20 MG IM SOLR
20.0000 mg | Freq: Once | INTRAMUSCULAR | Status: DC
  Filled 2020-09-30: qty 20

## 2020-09-30 MED ORDER — DEXTROSE 50 % IV SOLN
INTRAVENOUS | Status: AC
  Filled 2020-09-30: qty 50

## 2020-09-30 MED ORDER — INSULIN ASPART PROT & ASPART (70-30 MIX) 100 UNIT/ML ~~LOC~~ SUSP: 35.0000 [IU] | Freq: Two times a day (BID) | SUBCUTANEOUS | 0 refills | Status: DC

## 2020-09-30 MED ORDER — LEVOTHYROXINE SODIUM 50 MCG PO TABS: 50.0000 ug | ORAL_TABLET | Freq: Every day | ORAL | 6 refills | Status: DC

## 2020-09-30 MED ORDER — QUETIAPINE FUMARATE 100 MG PO TABS: 100.0000 mg | ORAL_TABLET | Freq: Every morning | ORAL | 6 refills | Status: DC

## 2020-09-30 MED ORDER — ALPRAZOLAM 0.5 MG PO TABS
0.5000 mg | ORAL_TABLET | Freq: Once | ORAL | Status: AC
  Administered 2020-09-30: 0.5 mg via ORAL
  Filled 2020-09-30: qty 1

## 2020-09-30 MED ORDER — PYRIDOXINE HCL 50 MG PO TABS: 50.0000 mg | ORAL_TABLET | Freq: Every day | ORAL | 6 refills | Status: DC

## 2020-09-30 MED ORDER — QUETIAPINE FUMARATE 100 MG PO TABS
100.0000 mg | ORAL_TABLET | Freq: Every morning | ORAL | Status: DC
  Administered 2020-10-01: 10:00:00 100 mg via ORAL
  Filled 2020-09-30: qty 1

## 2020-09-30 MED ORDER — INSULIN NPH (HUMAN) (ISOPHANE) 100 UNIT/ML ~~LOC~~ SUSP
30.0000 [IU] | Freq: Two times a day (BID) | SUBCUTANEOUS | Status: DC
  Administered 2020-09-30: 30 [IU] via SUBCUTANEOUS

## 2020-09-30 MED ORDER — IBUPROFEN 200 MG PO TABS
600.0000 mg | ORAL_TABLET | Freq: Once | ORAL | Status: AC
  Administered 2020-09-30: 600 mg via ORAL
  Filled 2020-09-30: qty 3

## 2020-09-30 MED ORDER — INSULIN ASPART 100 UNIT/ML ~~LOC~~ SOLN
3.0000 [IU] | Freq: Three times a day (TID) | SUBCUTANEOUS | Status: DC
  Administered 2020-09-30: 3 [IU] via SUBCUTANEOUS

## 2020-09-30 MED ORDER — INSULIN NPH (HUMAN) (ISOPHANE) 100 UNIT/ML ~~LOC~~ SUSP
25.0000 [IU] | Freq: Two times a day (BID) | SUBCUTANEOUS | Status: DC
  Administered 2020-09-30 – 2020-10-01 (×2): 25 [IU] via SUBCUTANEOUS

## 2020-09-30 MED ORDER — IBUPROFEN 200 MG PO TABS: 400.0000 mg | ORAL_TABLET | Freq: Four times a day (QID) | ORAL | Status: DC | PRN

## 2020-09-30 MED ORDER — DIVALPROEX SODIUM 250 MG PO DR TAB: 750.0000 mg | DELAYED_RELEASE_TABLET | Freq: Every evening | ORAL | 6 refills | Status: DC

## 2020-09-30 MED ORDER — INSULIN ASPART 100 UNIT/ML ~~LOC~~ SOLN
6.0000 [IU] | Freq: Three times a day (TID) | SUBCUTANEOUS | Status: DC
  Administered 2020-09-30: 6 [IU] via SUBCUTANEOUS

## 2020-09-30 MED ORDER — QUETIAPINE FUMARATE 400 MG PO TABS: 400.0000 mg | ORAL_TABLET | Freq: Every day | ORAL | 6 refills | Status: DC

## 2020-09-30 MED ORDER — MUSCLE RUB 10-15 % EX CREA
TOPICAL_CREAM | CUTANEOUS | Status: DC | PRN
  Filled 2020-09-30: qty 85

## 2020-09-30 MED ORDER — ACETAMINOPHEN 325 MG PO TABS: 650.0000 mg | ORAL_TABLET | Freq: Four times a day (QID) | ORAL | Status: DC | PRN

## 2020-09-30 MED FILL — ULTICARE INS 0.5 ML 30GX1/2: 30G X 1/2" | 30 days supply | Qty: 60 | Fill #0

## 2020-09-30 MED FILL — QUETIAPINE FUMARATE 200 MG: 200 | 30 days supply | Qty: 60 | Fill #0

## 2020-09-30 MED FILL — QUETIAPINE FUMARATE 100 MG: 100 | 30 days supply | Qty: 30 | Fill #0

## 2020-09-30 MED FILL — VITAMIN B6 50 MG TABS: 50 | 30 days supply | Qty: 30 | Fill #0

## 2020-09-30 MED FILL — ESCITALOPRAM 20 MG TABLET: 20 | 30 days supply | Qty: 30 | Fill #0

## 2020-09-30 MED FILL — NOVOLOG MIX 70/30 VIAL: (70-30) 100 | 14 days supply | Qty: 10 | Fill #0

## 2020-09-30 MED FILL — ATORVASTATIN CALCIUM 20 MG: 20 | 30 days supply | Qty: 30 | Fill #0

## 2020-09-30 MED FILL — GABAPENTIN 100 MG CAPSULE: 100 | 30 days supply | Qty: 60 | Fill #0

## 2020-09-30 MED FILL — DIVALPROEX SOD DR 250 MG TA: 250 | 30 days supply | Qty: 90 | Fill #0

## 2020-09-30 MED FILL — LEVOTHYROXINE SODIUM 50 MCG: 50 | 30 days supply | Qty: 30 | Fill #0

## 2020-09-30 MED FILL — DIVALPROEX SOD DR 500 MG TA: 500 | 30 days supply | Qty: 30 | Fill #0

## 2020-09-30 NOTE — Progress Notes (Signed)
Pt's blood sugar dropped to 45. Pt was given grape juice and ice cream. Pt was eating his lunch tray as his CBG was being checked. After reaching his CBG increased to 75. Dr Catha Gosselin notified. She advised to not give afternoon insulin that was scheduled.

## 2020-09-30 NOTE — Progress Notes (Signed)
Physical Therapy Treatment Patient Details Name: Mark [name] MRN: [id number] DOB: 1985/12/29 Today's Date: 09/30/2020    History of Present Illness 34 year old male with history of seizures on Depakote, type 1 diabetes with frequent episodes of DKA, hydrocephalus who presents to the emergency department on 11/12 with altered mental status with CBG in 500s and sonorous breath sounds.    PT Comments    Pt agreeable to session but continually stated "I can't" when cued to perform tasks, resulting in his balance and safety declining. For example, his knees would buckle and his trunk would flex when standing and trying to take steps to R to transfer to chair, especially when he would become frustrated during cues to extend knees or push up through arms, resulting in a quick maxAx2 to pull pt posteriorly to safely place in chair. Educated pt extensively on necessity to change mind set and to provide effort when getting assistance from nursing staff to make progress towards his goals. Made plan to try to get up daily x2 to chair and back to bed and to continue HEP of LAQ to increase his strength and endurance. Will continue to follow acutely. Current recommendations remain appropriate.    Follow Up Recommendations  Supervision/Assistance - 24 hour;SNF (likely will d/c home though, thus max [facility] services)     Equipment Recommendations  Rolling walker with 5" wheels;3in1 (PT);Wheelchair (measurements PT);Wheelchair cushion (measurements PT);Other (comment);Hospital bed (ramp)    Recommendations for Other Services       Precautions / Restrictions Precautions Precautions: Fall Restrictions Weight Bearing Restrictions: No    Mobility  Bed Mobility Overal bed mobility: Needs Assistance Bed Mobility: Supine to Sit     Supine to sit: Mod assist     General bed mobility comments: Cued to bring legs off EOB, with modA due to believed L hip flexor soreness with hip flexion this date. Cued pt to  push up with arms, but pt got frustrated and required modA to ascend trunk.  Transfers Overall transfer level: Needs assistance Equipment used: Rolling walker (2 wheeled) Transfers: Sit to/from [employer] Sit to Stand: +2 physical assistance;Mod assist Stand pivot transfers: +2 physical assistance;Max assist;+2 safety/equipment       General transfer comment: Cues for hand placement and feet placement shoulder width apart and posteriorly on floor. Cued pt to extend knees and hips to come to stand, with pt able to initiate but required modAx2 to complete and obtain full upright standing posture and transition hands to RW. Slow, single step commands, provided to shift weight and move one leg at a time and to manage RW between steps to step to R to transfer to chair. Pt's trunk would flex and his knees would buckle quickly despite max cues to push up throguh arms and legs and thus pulled quickly posteriorly with maxAx2 to chair safely. x2 sit to stand and x1 stand step transfer.  Ambulation/Gait Ambulation/Gait assistance: +2 physical assistance;Max assist;+2 safety/equipment Gait Distance (Feet): 2 Feet Assistive device: Rolling walker (2 wheeled) Gait Pattern/deviations: Trunk flexed;Narrow base of support;Decreased weight shift to right;Decreased weight shift to left;Decreased stride length Gait velocity: reduced Gait velocity interpretation: <1.31 ft/sec, indicative of household ambulator General Gait Details: Several side steps to R to transfer to chair with RW and maxAx2 due to pts' quick fatigue and knee buckling with trunk flexion despite max cues to correct.   Stairs             Wheelchair Mobility    Modified  Rankin (Stroke Patients Only)       Balance Overall balance assessment: Needs assistance Sitting-balance support: Bilateral upper extremity supported;Feet supported Sitting balance-Leahy Scale: Poor Sitting balance - Comments: UE support and  intermittent minA for static sitting EOB but otherwise min guard with cues to find and maintain midline. Postural control: Posterior lean;Right lateral lean Standing balance support: Bilateral upper extremity supported Standing balance-Leahy Scale: Zero Standing balance comment: Bilat UE support and maxAx2 with knee block for static standing x1 bout of ~20 sec following transfer to chair.                            Cognition Arousal/Alertness: Awake/alert Behavior During Therapy: WFL for tasks assessed/performed;Flat affect Overall Cognitive Status: Impaired/Different from baseline Area of Impairment: Safety/judgement;Awareness;Problem solving                         Safety/Judgement: Decreased awareness of safety;Decreased awareness of deficits Awareness: Emergent Problem Solving: Slow processing;Decreased initiation;Requires tactile cues;Requires verbal cues;Difficulty sequencing General Comments: Required repeated step-by-step cues to sequence and initiate all tasks, with difficulty understanding cues if not simple.      Exercises      General Comments General comments (skin integrity, edema, etc.): Edcuated pt to get up OOB at least 2x/day and to continue HEP of LAQ to inc strength and endurance      Pertinent Vitals/Pain Pain Assessment: Faces Faces Pain Scale: Hurts little more Pain Location: L hip flexor region with movement Pain Descriptors / Indicators: Discomfort;Grimacing Pain Intervention(s): Limited activity within patient's tolerance;Monitored during session;Repositioned    Home Living                      Prior Function            PT Goals (current goals can now be found in the care plan section) Acute Rehab PT Goals Patient Stated Goal: to go home PT Goal Formulation: With patient Time For Goal Achievement: 10/07/20 Potential to Achieve Goals: Fair Progress towards PT goals: Progressing toward goals    Frequency    Min  3X/week      PT Plan Current plan remains appropriate    Co-evaluation              AM-PAC PT "6 Clicks" Mobility   Outcome Measure  Help needed turning from your back to your side while in a flat bed without using bedrails?: A Little Help needed moving from lying on your back to sitting on the side of a flat bed without using bedrails?: A Lot Help needed moving to and from a bed to a chair (including a wheelchair)?: A Lot Help needed standing up from a chair using your arms (e.g., wheelchair or bedside chair)?: A Lot Help needed to walk in hospital room?: Total Help needed climbing 3-5 steps with a railing? : Total 6 Click Score: 11    End of Session         PT Visit Diagnosis: Other abnormalities of gait and mobility (R26.89);Muscle weakness (generalized) (M62.81);Unsteadiness on feet (R26.81);Difficulty in walking, not elsewhere classified (R26.2)     Time: 9622-2979 PT Time Calculation (min) (ACUTE ONLY): 17 min  Charges:  $Therapeutic Activity: 8-22 mins                     [name], PT, DPT Acute Rehabilitation Services  Pager: [phone] Office: [phone]  Henrene Dodge Pettis 09/30/2020, 12:05 PM

## 2020-09-30 NOTE — Progress Notes (Signed)
[facility] PROGRESS NOTE  Mark [name] WUX:[id number] DOB: [birth year] DOA: 09/02/2020 PCP: Patient, No Pcp Per  Status: Remains inpatient appropriate because:Unsafe d/c plan   Dispo: The patient is from: Home              Anticipated d/c is to: Home (SNF recommended but patient refused)              Anticipated d/c date is: > 3 days              Patient currently is medically stable to d/c.  Barriers to discharge: Patient refusing SNF placement.  Waxing and waning ability to have capacity to make medical decisions.  Very deconditioned and unable to return to home environment due to ambulatory dysfunction.   Code Status: Full Family Communication: Patient DVT prophylaxis: SCDs Vaccination status: No documentation that patient has received Covid vaccination  Foley catheter: No-external male urinary collection device in place   HPI: With history of diabetes type 1, anxiety/depression manifested as bipolar disorder, seizure disorder, hydrocephalus and tobacco use.  He was sent to the ER with altered mental status with seizure like activity and found to be in severe DKA with encephalopathy that required intubation/ventilation from 11/12-11/14.  Subsequent CT head demonstrated persistent marked hydrocephalus assistant with acute aqueductal stenosis.  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  Neurosurgery did not recommend any surgical intervention or placement of new drain.  He was initially treated for aspiration pneumonia with IV Rocephin and vancomycin.  Subsequently patient has been transferred out of the ICU.  PT/OT have recommended CIR but patient does not have insurance.  Medicaid application in process.  SNF recommended for short-term rehab but patient has refused.  He lives with a disabled friend and there are multiple stairs within the home.  Based on current PT and OT evaluations patient is unable to discharge home independently based on  increased mobility needs.  Because of concerns for capacity psychiatry was consulted.  It was documented that his insight and judgment as continued to fluctuate throughout this hospitalization and thus was deemed lacking of capacity to make medical decisions.  He did not meet criteria for inpatient behavioral health treatment though.  Plan is to focus on aggressive PT and OT and allow patient to discharge to home environment once physically stable.  Subjective: Awake and complaining of left anterior and lateral hip discomfort especially when walking.  Reproducible with palpation.  Patient pleasant and receptive to instructions and suggestions today.  Discussed with patient incident that occurred yesterday evening involving him become very frustrated and wanting to leave AMA despite being unable to even get self up out of bed.  Both myself and attending physician had frank discussion with him in regards to his current abusive behaviors towards staff because he is frustrated that he is not at home.  Once again SNF offered since would provide patient with more frequent and longer duration therapy episodes then available at hospital.  Objective: Vitals:   09/30/20 0800 09/30/20 1143  BP: 115/88 131/85  Pulse: 86 (!) 110  Resp: 19 18  Temp: 97.9 F (36.6 C) 98.2 F (36.8 C)  SpO2: 98% 99%    Intake/Output Summary (Last 24 hours) at 09/30/2020 1150 Last data filed at 09/30/2020 1108 Gross per 24 hour  Intake 240 ml  Output 2550 ml  Net -2310 ml   Filed Weights   09/07/20 0500 09/08/20 0500 09/09/20 0453  Weight: 103 kg 102.2  kg 97.4 kg    Exam:  Constitutional: NAD, calm, comfortable Respiratory: clear to auscultation bilaterally no wheezing or rhonchi.  Normal respiratory effort.  Room air Cardiovascular: Regular rate and rhythm, No extremity edema.  Appropriate capillary refill Abdomen: no tenderness, tolerating diet.  Bowel sounds positive.  Musculoskeletal: no clubbing / cyanosis. No  joint deformity upper and lower extremities. Good ROM, no contractures.  12/6 was noted with truncal instability with attempts to sit on EOB.  Tender left anterior thigh at groin and left lateral thigh. Neurologic: CN 2-12 grossly intact. Sensation intact, Strength 5/5 x upper extremities and 4/5 lower extremities extremities. Psychiatric:  Alert and oriented x 3. Normal mood.    Assessment/Plan: Acute problems: Severe physical deconditioning with associated ambulatory dysfunction -No focal neurological deficits only generalized weakness of lower extremities -Continues to require max assist with therapies and at this juncture SNF continues to be recommended by therapy providers.  Patient has multiple stairs in the home he shares with a friend and would need to be able to navigate stairs before can safely discharge home. -PT/OT schedule 4x per week-as of 12/8 PT documented patient making significant progress towards his goals.  He appears to be more willing and motivated to participate and improve.  His roommates are present at the start of the session and it is reported that they can assist physically if needed but patient would definitely need to be able to navigate the 4-5 steps to enter the home. -Has difficulty participating with PT due to frustration over lack of progress.  Suspect his bipolar disorder also contributing and as needed dose of Seroquel during the day begins to take effect hopefully participation with PT/OT will improve  Left thigh pain -Begin ibuprofen prn and CREA  Acute metabolic encephalopathy/Capacity concerns/bipolar disorder -Presented with acute encephalopathy likely related to DKA, hypoxia and aspiration pneumonitis -Continues to have waxing and waning issues regarding capacity per psychiatry team (reevaluated again on 12/3): documented that he lacks insight and is unable to link because of relationships.  Evidence is based on his inability to weigh current physical  limitations, remains limitations and need for physical therapy services.  I suspect this is patient's chronic baseline from his ongoing bipolar disorder.  Of note did not meet criteria for inpatient hospitalization -Will need outpatient APS follow-up; it appears patient had previously been enrolled in Medicaid but has been unable to consistently complete required follow-up paperwork to remain enrolled and will need assistance with this in the future -Continue Seroquel, Depakote and Lexapro (max dose) -IM Haldol prn for agitation -Melatonin at HS for insomnia symptoms; Seroquel from increased 300 to 400 mg at bedtime last week.  Continue Seroquel 50 mg during the day  Uncontrolled diabetes type 1 with hyperglycemia and presentation with DKA, hypoglycemic episodes since admission -Brittle diabetes-Hemoglobin A1c 8.2 -Continue NPH insulin 25 units BID-had attempted to increase dosage to 30 units BID to significant hyperglycemia over the past 24 hours.  Unfortunately after given dose this morning meal coverage which had also been increased patient CBG dropped to 45 therefore previous dose of 25 units resumed -Continue meal coverage 3 units TID -Continue SSI -Current CBGs have been between 68 and 182 therefore will allow for degree of mild permissive hyperglycemia; could conceivably give meal coverage as long as eats 50% of meals but patient will be unable to manage SSI after discharge  Chronic hydrocephalus/?history of seizure disorder -Persistent changes of hydrocephalus involving the third and lateral ventricles with normal fourth ventricle assistant  with aqueductal stenosis -Neurosurgery evaluated during admission and no indications for surgical intervention -There was also a question of possible seizure-like activity at presentation but was evaluated by neurology earlier in the hospitalization who determined he was not actively seizing -Continue Depakote  Dysphagia -Stable on D3 diet  Nutrition  Status: Nutrition Problem: Inadequate oral intake-RESOLVED-now eating 100% of meals Etiology: inability to eat-resolved Signs/Symptoms: NPO status-now on D3 diet with Glucerna supplements, oral vitamin B6 replacement Interventions: Refer to RD note for recommendations     Other problems: Electrolyte abnormalities (Low K/Mg/Phos) -Labs have been stable after patient given replacement -Primarily related to presentation with DKA  Dyslipidemia -Continue Lipitor  Hypothyroidism with elevated TSH -TSH 14.4 (was 18.4 in May) -Noncompliant suspected although could be taking Synthroid with food which will inhibit correct absorption -Continue Synthroid 50 mcg daily -Repeat TSH mid December 2021  Anemia of chronic disease -Hemoglobin has remained stable with last reading 11.8 on 11/21   Data Reviewed: Basic Metabolic Panel: Recent Labs  Lab 09/24/20 0205 09/26/20 1009  NA 136 139  K 3.9 4.1  CL 98 99  CO2 27 28  GLUCOSE 270* 153*  BUN 15 15  CREATININE 0.87 0.94  CALCIUM 9.6 9.7   Liver Function Tests: Recent Labs  Lab 09/26/20 1009  AST 18  ALT 16  ALKPHOS 71  BILITOT 0.6  PROT 7.2  ALBUMIN 3.6   No results for input(s): LIPASE, AMYLASE in the last 168 hours. No results for input(s): AMMONIA in the last 168 hours. CBC: Recent Labs  Lab 09/26/20 1009  WBC 8.2  NEUTROABS 5.1  HGB 14.3  HCT 41.4  MCV 88.5  PLT 373   Cardiac Enzymes: No results for input(s): CKTOTAL, CKMB, CKMBINDEX, TROPONINI in the last 168 hours. BNP (last 3 results) No results for input(s): BNP in the last 8760 hours.  ProBNP (last 3 results) No results for input(s): PROBNP in the last 8760 hours.  CBG: Recent Labs  Lab 09/29/20 2337 09/30/20 0230 09/30/20 0410 09/30/20 0612 09/30/20 1135  GLUCAP 411* 393* 376* 271* 43*    Recent Results (from the past 240 hour(s))  Culture, blood (Routine X 2) w Reflex to ID Panel     Status: None (Preliminary result)   Collection Time:  09/26/20 10:09 AM   Specimen: BLOOD  Result Value Ref Range Status   Specimen Description BLOOD RIGHT ANTECUBITAL  Final   Special Requests   Final    BOTTLES DRAWN AEROBIC AND ANAEROBIC Blood Culture adequate volume   Culture   Final    NO GROWTH 4 DAYS Performed at [facility], [address] [place], [place] [address]    Report Status PENDING  Incomplete  Culture, blood (Routine X 2) w Reflex to ID Panel     Status: None (Preliminary result)   Collection Time: 09/26/20 10:19 AM   Specimen: BLOOD  Result Value Ref Range Status   Specimen Description BLOOD LEFT ANTECUBITAL  Final   Special Requests   Final    BOTTLES DRAWN AEROBIC AND ANAEROBIC Blood Culture results may not be optimal due to an inadequate volume of blood received in culture bottles   Culture   Final    NO GROWTH 4 DAYS Performed at [facility], [address], [place], [place] [address]    Report Status PENDING  Incomplete     Studies: No results found.  Scheduled Meds: . atorvastatin  20 mg Oral Daily  . Chlorhexidine Gluconate Cloth  6 each Topical Daily  . divalproex  500 mg Oral Daily  . divalproex  750 mg Oral QPM  . escitalopram  20 mg Oral Daily  . feeding supplement (GLUCERNA SHAKE)  237 mL Oral TID BM  . insulin aspart  0-5 Units Subcutaneous QHS  . insulin aspart  0-9 Units Subcutaneous TID WC  . insulin aspart  6 Units Subcutaneous TID WC  . insulin NPH Human  30 Units Subcutaneous BID AC & HS  . levothyroxine  50 mcg Oral Q0600  . melatonin  6 mg Oral QHS  . pantoprazole  40 mg Oral Q1200  . vitamin B-6  50 mg Oral Daily  . QUEtiapine  400 mg Oral QHS  . QUEtiapine  50 mg Oral q morning - 10a   Continuous Infusions:  Principal Problem:   AMS (altered mental status) Active Problems:   Diabetic keto-acidosis (HCC)   Obstructive hydrocephalus (HCC)   Bipolar I disorder, most recent episode depressed, severe without psychotic features (HCC)   Acute metabolic  encephalopathy   Bipolar I disorder (HCC)   Uncontrolled type 1 diabetes mellitus with hyperglycemia (HCC)   Physical deconditioning   Ambulatory dysfunction   Insomnia due to mental disorder   Fever   Consultants: Neurology Neurosurgery PCCM Psychiatry  Procedures:  Intubation/extubation  EEG  MBSS  Antibiotics: Anti-infectives (From admission, onward)   Start     Dose/Rate Route Frequency Ordered Stop   09/03/20 0945  cefTRIAXone (ROCEPHIN) 2 g in sodium chloride 0.9 % 100 mL IVPB        2 g 200 mL/hr over 30 Minutes Intravenous Every 24 hours 09/03/20 0854 09/06/20 1126   09/03/20 0800  ceFEPIme (MAXIPIME) 2 g in sodium chloride 0.9 % 100 mL IVPB  Status:  Discontinued        2 g 200 mL/hr over 30 Minutes Intravenous Every 24 hours 09/02/20 0742 09/02/20 1331   09/02/20 2200  vancomycin (VANCOREADY) IVPB 750 mg/150 mL  Status:  Discontinued        750 mg 150 mL/hr over 60 Minutes Intravenous Every 12 hours 09/02/20 1329 09/03/20 0854   09/02/20 1800  ceFEPIme (MAXIPIME) 2 g in sodium chloride 0.9 % 100 mL IVPB  Status:  Discontinued        2 g 200 mL/hr over 30 Minutes Intravenous Every 8 hours 09/02/20 1331 09/03/20 0854   09/02/20 0745  ceFEPIme (MAXIPIME) 2 g in sodium chloride 0.9 % 100 mL IVPB        2 g 200 mL/hr over 30 Minutes Intravenous  Once 09/02/20 0732 09/02/20 0932   09/02/20 0745  vancomycin (VANCOREADY) IVPB 2000 mg/400 mL        2,000 mg 200 mL/hr over 120 Minutes Intravenous  Once 09/02/20 0733 09/02/20 1257   09/02/20 0741  vancomycin variable dose per unstable renal function (pharmacist dosing)  Status:  Discontinued         Does not apply See admin instructions 09/02/20 0742 09/02/20 1329   09/02/20 0730  aztreonam (AZACTAM) 2 g in sodium chloride 0.9 % 100 mL IVPB  Status:  Discontinued        2 g 200 mL/hr over 30 Minutes Intravenous  Once 09/02/20 0728 09/02/20 0732   09/02/20 0730  metroNIDAZOLE (FLAGYL) IVPB 500 mg        500 mg 100  mL/hr over 60 Minutes Intravenous  Once 09/02/20 0728 09/02/20 1040   09/02/20 0730  vancomycin (VANCOCIN) IVPB 1000 mg/200 mL premix  Status:  Discontinued        1,000 mg 200 mL/hr over 60 Minutes Intravenous  Once 09/02/20 0728 09/02/20 0733       Time spent: 20 minutes    Junious Silkllison Ieesha Abbasi ANP  [facility] Pager [phone].

## 2020-09-30 NOTE — Progress Notes (Signed)
Wheelchair:   Comments  Patient suffers from weakness which impairs their ability to perform daily activities like ambulating and bathing in the home. A walker will not resolve  issue with performing activities of daily living. A wheelchair will allow patient to safely perform daily activities. Patient is not able to propel themselves in the home using a standard weight wheelchair due to weakness. Patient can self propel in the lightweight wheelchair. Length of need Lifetime.  Accessories: elevating leg rests (ELRs), wheel locks, extensions and anti-tippers.

## 2020-09-30 NOTE — Progress Notes (Signed)
Patient becoming physically and verbally aggressive.  This tends to be an afternoon behavior.  He was insisting on AMA and continue to try to get up without assistance and was physically unable to do so.  Staff including myself assisted patient while he was standing and he became so weak he repeatedly fell back against the bed but stated we were pushing him down.  Unable to use verbal redirection and other methods to calm patient and he became more more loud abusive and utilizing excessive amounts of profanity.  Security was called to the room and patient initially responded better to a male presence.  He continues to insist on going home stating I will be okay in a wheelchair.  We addressed the obstacle of multiple stairs that patient must traverse before entering home he shares with roommate.  Patient has previously been evaluated by psychiatry who stated patient lacked capacity to make medical decisions but did not meet criteria for inpatient admission or for IVC.  Patient reported to previous psychiatric provider that he knew how to dial 911 if he and/or his roommate fell on the floor while performing his care.  Patient is not suicidal or homicidal or secure check conversation with psych provider there is no indication to keep this patient in the hospital if he prefers to leave AMA.  Discussed at length with patient's roommate [name].  He is disabled from a psychiatric standpoint and is visually impaired.  He does not drive.  Clearly he would be unable to get the patient up and down the stairs but is agreeable to have [facility] transport into the home.  He is agreeable to having patient utilize wheelchair for mobility and he will assist him otherwise with his care and mobility.  We discussed that patient does not have any funding yet including Medicaid or disability to pay for any home health services.  We will be providing medications from [facility].  Unfortunately any further transport back and forth from the  home for medical care will need to be arranged through [facility] while he is unable to navigate or ambulate stairs.  Once he is able to at least walk somewhat up and down the stairs to get into a wheelchair [facility] transportation can be arranged.  Discharge plan is to obtain medications tonight from [facility] to have available for discharge tomorrow.  Care will be brought to remain at home tomorrow [facility] transport will also be arranged.  Make concerned that given patient's underlying peripheral neuropathy which is chronic that he may have some worsening neurological concerns and requested that neurology be reconsulted prior to discharge.  Of note, this roommate states that 90% of the time patient's neuropathy does not impair his ability to ambulate prior to this admission.

## 2020-09-30 NOTE — Discharge Summary (Signed)
Physician Discharge Summary  Mark [name] ERD:[id number] DOB: [birth year] DOA: 09/02/2020  PCP: Patient, No Pcp Per  Admit date: 09/02/2020 Discharge date: 09/30/2020  Time spent: 65 minutes  Recommendations for Outpatient Follow-up:  1. Due to ambulatory dysfunction and inability to navigate stairs patient will discharge home by [facility] and up the stairs by stretcher 2. TOC arranging for wheelchair to be delivered to patient's home on 12/11 3. Unfortunately patient not eligible for any home health services because he has no funding including disability or Medicaid 4. Patient has longstanding psychiatric dysfunction which has worsened.  At this juncture I do not suspect patient would be able to maintain active employment 2/2 to his bipolar disorder.  It is our recommendation that patient be approved for permanent disability based on this condition. 5. If patient chooses to remain in the hospital through 12/11 neurology will reevaluate patient regarding his worsening ambulatory dysfunction.  I did discuss with Dr. Leonel Ramsay who felt this was deconditioning related to critical illness and current symptoms and progress not unexpected 6. Patient and given an appointment with [facility] on 12/30 at 8:30 AM he will need follow-up TSH in January. 7. Because of lack of funding prescriptions were provided by the [facility] and he has 6 refills available on each prescription given.   Discharge Diagnoses:  Principal Problem:   AMS (altered mental status) Active Problems:   Diabetic keto-acidosis ([place])   Obstructive hydrocephalus (HCC)   Bipolar I disorder, most recent episode depressed, severe without psychotic features ([place])   Acute metabolic encephalopathy   Bipolar I disorder ([place])   Uncontrolled type 1 diabetes mellitus with hyperglycemia ([place])   Physical deconditioning   Ambulatory dysfunction   Insomnia due to mental disorder   Fever   Discharge  Condition: Medically stable  Diet recommendation: Diabetic  Filed Weights   09/07/20 0500 09/08/20 0500 09/09/20 0453  Weight: 103 kg 102.2 kg 97.4 kg    History of present illness:  With history of diabetes type 1, anxiety/depression manifested as bipolar disorder, seizure disorder, hydrocephalus and tobacco use.  He was sent to the ER with altered mental status with seizure like activity and found to be in severe DKA with encephalopathy that required intubation/ventilation from 11/12-11/14.  Subsequent CT head demonstrated persistent marked hydrocephalus assistant with acute aqueductal stenosis.  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  Neurosurgery did not recommend any surgical intervention or placement of new drain.  He was initially treated for aspiration pneumonia with IV Rocephin and vancomycin.  Subsequently patient has been transferred out of the ICU.  PT/OT have recommended CIR but patient does not have insurance.  Medicaid application in process.  SNF recommended for short-term rehab but patient has refused.  He lives with a disabled friend and there are multiple stairs within the home.  Based on current PT and OT evaluations patient is unable to discharge home independently based on increased mobility needs.  Because of concerns for capacity psychiatry was consulted.  It was documented that his insight and judgment as continued to fluctuate throughout this hospitalization and thus was deemed lacking of capacity to make medical decisions.  He was not suicidal and had enough capacity to understand that if he fell at home or had another medical emergency he should call 911. He did not meet criteria for inpatient behavioral health treatment though.  Plan was to focus on aggressive PT and OT and allow patient to discharge  to home environment once physically stable.  Patient has become frustrated with this plan and is requesting to be discharged home  Hospital  Course:  Acute problems: Severe physical deconditioning with associated ambulatory dysfunction -No focal neurological deficits only generalized weakness of lower extremities -Continues to require max assist with therapies and at this juncture SNF continues to be recommended by therapy providers.  Patient has multiple stairs in the home he shares with a friend and would need to be able to navigate stairs before can safely discharge home. -PT/OT schedule 4x per week patient is dissatisfied with progress and feels he can do better at home and is insisting upon being discharged. -Please see late afternoon progress note on 12/10 dictated at 3:33 PM.  Patient continues to have issues with ambulatory dysfunctioning and severe deconditioning requesting to be discharged home with wheelchair.  This was discussed with his roommate who was agreeable.  Plan is to obtain wheelchair and discharge patient home via ambulance since he cannot navigate stairs to get into the home -Patient will need to call ambulance transport for future physician visits until he is able to navigate stairs at that time he can be transported by [facility] concerned with underlying peripheral neuropathy that we may have another condition besides critical illness related deconditioning requested neurology evaluate patient prior to discharge.  Neurology will be unable to evaluate the patient until 12/11 and patient will be evaluated if he chooses to stay until 12/11.  Acute metabolic encephalopathy/Capacity concerns/bipolar disorder -Presented with acute encephalopathy likely related to DKA, hypoxia and aspiration pneumonitis -Continues to have waxing and waning issues regarding capacity per psychiatry team (reevaluated again on 12/3): documented that he lacks insight and is unable to link because of relationships.  Evidence is based on his inability to weigh current physical limitations, remains limitations and need for physical therapy  services.  I suspect this is patient's chronic baseline from his ongoing bipolar disorder.  Of note did not meet criteria for inpatient hospitalization -Will need outpatient APS follow-up; it appears patient had previously been enrolled in Medicaid but has been unable to consistently complete required follow-up paperwork to remain enrolled and will need assistance with this in the future -Continue Seroquel, Depakote and Lexapro (max dose) Seroquel from increased 300 to 400 mg at bedtime in this admission. Seroquel during the day added this admission as well and prior to discharge dose was increased to 100 mg.  Uncontrolled diabetes type 1 with hyperglycemia and presentation with DKA, hypoglycemic episodes since admission -Brittle diabetes-Hemoglobin A1c 8.2 -Patient will resume 70/30 insulin 35 units twice daily as prior to admission  Chronic hydrocephalus/?history of seizure disorder -Persistent changes of hydrocephalus involving the third and lateral ventricles with normal fourth ventricle assistant with aqueductal stenosis -Neurosurgery evaluated during admission and no indications for surgical intervention -There was also a question of possible seizure-like activity at presentation but was evaluated by neurology earlier in the hospitalization who determined he was not actively seizing -Continue Depakote  Dysphagia -Stable on D3 diet and at this juncture okay to resume regular texture diabetes diet upon discharge  Nutrition Status: Nutrition Problem: Inadequate oral intake-RESOLVED-now eating 100% of meals Etiology: inability to eat-resolved Signs/Symptoms: NPO status-now on D3 diet with Glucerna supplements, oral vitamin B6 replacement Interventions: Refer to RD note for recommendations     Other problems: Electrolyte abnormalities (Low K/Mg/Phos) -Labs have been stable after patient given replacement -Primarily related to presentation with DKA  Dyslipidemia -Continue  Lipitor after discharge  Hypothyroidism  with elevated TSH -TSH 14.4 (was 18.4 in May) -Noncompliant suspected although could be taking Synthroid with food which will inhibit correct absorption -Continue Synthroid 50 mcg daily -Repeat TSH mid December 2021  Anemia of chronic disease -Hemoglobin has remained stable with last reading 11.8 on 11/21  Procedures:  Intubation/extubation  EEG  MBSS   Consultations: Neurology Neurosurgery PCCM Psychiatry    Discharge Exam: Vitals:   09/30/20 1143 09/30/20 1551  BP: 131/85 133/76  Pulse: (!) 110 95  Resp: 18 18  Temp: 98.2 F (36.8 C) 99.3 F (37.4 C)  SpO2: 99% 98%   Constitutional: NAD, earlier today was calm but eventually became quite agitated, abusive and nearly combative when informed that he was not stable from an ambulatory standpoint to discharge home Respiratory: clear to auscultation bilaterally no wheezing or rhonchi.  Normal respiratory effort.  Room air Cardiovascular: Regular rate and rhythm, No extremity edema.  Appropriate capillary refill Abdomen: no tenderness, tolerating diet.  Bowel sounds positive.  Musculoskeletal: no clubbing / cyanosis. No joint deformity upper and lower extremities. Good ROM, no contractures.  12/6 was noted with truncal instability with attempts to sit on EOB.  Tender left anterior thigh at groin and left lateral thigh. Neurologic: CN 2-12 grossly intact. Sensation intact, Strength 5/5 x upper extremities and 4/5 lower extremities extremities. Psychiatric:  Alert and oriented x 3 but continues to lack insight into current limitations although has enough knowledge to state that "if I fail I know enough to call 911 dammit".  Very agitated mood verbally abusive behaviors and some physically abusive behaviors which improved after security arrived to room on 12/10.   Discharge Instructions   Discharge Instructions    Call MD for:  difficulty breathing, headache or visual disturbances    Complete by: As directed    Call MD for:  extreme fatigue   Complete by: As directed    Call MD for:  persistant dizziness or light-headedness   Complete by: As directed    Call MD for:  persistant nausea and vomiting   Complete by: As directed    Call MD for:  severe uncontrolled pain   Complete by: As directed    Call MD for:  temperature >100.4   Complete by: As directed    Diet Carb Modified   Complete by: As directed    Discharge instructions   Complete by: As directed    Please take all medications as prescribed.  You have been given initial prescriptions with 6 refills through [facility] pharmacy at [facility] provider attempting to arrange outpatient follow-up with [facility].  Please keep appointment if arranged.  Because of your ambulatory dysfunction i.e. you cannot walk up and down steps in and out of your home, you will need ambulance transport back and forth to physician appointments to you or strong enough to use a walker and assistance to get up and down steps to your walker.  Once you have increased your ability to walk [facility] services needs to be contacted for you by wheelchair to physician visits.  Fortunately they do not take wheelchair-bound patients up and down steps.   Increase activity slowly   Complete by: As directed      Allergies as of 09/30/2020      Reactions   Mushroom Extract Complex Hives, Itching, Nausea And Vomiting   Penicillins Anaphylaxis, Hives, Swelling   Has patient had a PCN reaction causing immediate rash, facial/tongue/throat swelling, SOB or lightheadedness with hypotension:  Yes Has patient had a PCN reaction causing severe rash involving mucus membranes or skin necrosis: No Has patient had a PCN reaction that required hospitalization: Yes Has patient had a PCN reaction occurring within the last 10 years: Yes If all of the above answers are "NO", then may proceed with Cephalosporin use. Tolerated ceftriaxone 03/24/20   Sulfa  Antibiotics Anaphylaxis, Hives   Clindamycin/lincomycin Hives      Medication List    STOP taking these medications   blood glucose meter kit and supplies Kit   Insulin Syringes (Disposable) U-100 0.5 ML Misc   Pen Needles 31G X 5 MM Misc     TAKE these medications   acetaminophen 325 MG tablet Commonly known as: TYLENOL Take 2 tablets (650 mg total) by mouth every 6 (six) hours as needed for mild pain, fever or headache.   atorvastatin 20 MG tablet Commonly known as: LIPITOR Take 1 tablet (20 mg total) by mouth daily.   divalproex 250 MG DR tablet Commonly known as: DEPAKOTE Take 3 tablets (750 mg total) by mouth every evening. What changed: additional instructions   divalproex 500 MG DR tablet Commonly known as: DEPAKOTE Take 1 tablet (500 mg total) by mouth daily. Start taking on: October 01, 2020 What changed: additional instructions   escitalopram 20 MG tablet Commonly known as: LEXAPRO Take 1 tablet (20 mg total) by mouth daily. Start taking on: October 01, 2020 What changed: additional instructions   gabapentin 100 MG capsule Commonly known as: NEURONTIN Take 1 capsule (100 mg total) by mouth 2 (two) times daily.   insulin aspart protamine- aspart (70-30) 100 UNIT/ML injection Commonly known as: NovoLOG Mix 70/30 Inject 0.35 mLs (35 Units total) into the skin 2 (two) times daily with a meal.   levothyroxine 50 MCG tablet Commonly known as: SYNTHROID Take 1 tablet (50 mcg total) by mouth daily at 6 (six) AM. Start taking on: October 01, 2020 What changed:   medication strength  how much to take  when to take this   pyridOXINE 50 MG tablet Commonly known as: B-6 Take 1 tablet (50 mg total) by mouth daily. Start taking on: October 01, 2020   QUEtiapine 400 MG tablet Commonly known as: SEROQUEL Take 1 tablet (400 mg total) by mouth at bedtime. What changed:   medication strength  how much to take  additional instructions   QUEtiapine  100 MG tablet Commonly known as: SEROQUEL Take 1 tablet (100 mg total) by mouth every morning. Start taking on: October 01, 2020 What changed: You were already taking a medication with the same name, and this prescription was added. Make sure you understand how and when to take each.            Durable Medical Equipment  (From admission, onward)         Start     Ordered   09/30/20 1615  DME Walker  Once       Question Answer Comment  Walker: With 5 Inch Wheels   Patient needs a walker to treat with the following condition Ambulatory dysfunction   Patient needs a walker to treat with the following condition Peripheral sensory neuropathy      09/30/20 1620   09/30/20 1532  For home use only DME lightweight manual wheelchair with seat cushion  Once       Comments: Patient suffers from weakness which impairs their ability to perform daily activities like ambulating and bathing in the home.  A walker  will not resolve  issue with performing activities of daily living. A wheelchair will allow patient to safely perform daily activities. Patient is not able to propel themselves in the home using a standard weight wheelchair due to weakness. Patient can self propel in the lightweight wheelchair. Length of need Lifetime. Accessories: elevating leg rests (ELRs), wheel locks, extensions and anti-tippers.   09/30/20 1532         Allergies  Allergen Reactions  . Mushroom Extract Complex Hives, Itching and Nausea And Vomiting  . Penicillins Anaphylaxis, Hives and Swelling    Has patient had a PCN reaction causing immediate rash, facial/tongue/throat swelling, SOB or lightheadedness with hypotension: Yes Has patient had a PCN reaction causing severe rash involving mucus membranes or skin necrosis: No Has patient had a PCN reaction that required hospitalization: Yes Has patient had a PCN reaction occurring within the last 10 years: Yes If all of the above answers are "NO", then may proceed  with Cephalosporin use. Tolerated ceftriaxone 03/24/20  . Sulfa Antibiotics Anaphylaxis and Hives  . Clindamycin/Lincomycin Hives    Follow-up Information    [facility] Follow up on 10/20/2020.   Specialty: Family Medicine Why: Your appointment time is at 8:30 am. Please arrive early and bring a picture ID and your current medications. Contact information: [place] [address] [place] [place] Follow up.   Why: Please use this location for your pharmacy need. they will assist with the cost. Contact information: [address] [place] [address] [phone]               The results of significant diagnostics from this hospitalization (including imaging, microbiology, ancillary and laboratory) are listed below for reference.    Significant Diagnostic Studies: EEG  Result Date: 09/02/2020 Lora Havens, MD     09/02/2020 10:53 AM Patient Name: Mark [name] MRN: [id number] Epilepsy Attending: Lora Havens Referring Physician/Provider: Dr. [name] Date: 09/02/2017 Duration: 24.08 minutes Patient history: 34 year old male with history of seizures who presented after possible seizure and in DKA.  EEG to evaluate for seizures. Level of alertness: comatose AEDs during EEG study: Keppra, Ativan, Versed Technical aspects: This EEG study was done with scalp electrodes positioned according to the 10-20 International system of electrode placement. Electrical activity was acquired at a sampling rate of _0  and reviewed with a high frequency filter of _1  and a low frequency filter of _2 . EEG data were recorded continuously and digitally stored. Description: EEG showed continuous generalized polymorphic mixed frequencies with predominantly 6 to 9 Hz theta-alpha activity as well as intermittent generalized 2 to 3 Hz delta activity.  Brief 1 to 2-second  periods of generalized EEG attenuation were also noted.  Hyperventilation and photic stimulation were not performed.   ABNORMALITY -Continuous slow, generalized IMPRESSION: This study is suggestive of severe diffuse encephalopathy, nonspecific etiology.  No seizures or definite epileptiform discharges were seen throughout the recording. Lora Havens   DG Chest 2 View  Result Date: 09/26/2020 CLINICAL DATA:  Fever EXAM: CHEST - 2 VIEW COMPARISON:  September 07, 2020 FINDINGS: Trachea midline. Cardiomediastinal contours and hilar structures are normal. Lungs are clear. No sign of pleural effusion. On limited assessment no acute skeletal process. IMPRESSION: No acute cardiopulmonary disease. Electronically Signed   By: Zetta Bills M.D.   On: 09/26/2020 09:04   CT Head Wo Contrast  Result Date:  09/02/2020 CLINICAL DATA:  Mental status change. Patient found unresponsive. EXAM: CT HEAD WITHOUT CONTRAST TECHNIQUE: Contiguous axial images were obtained from the base of the skull through the vertex without intravenous contrast. COMPARISON:  Head CT 12/07/2017 FINDINGS: Brain: Persistent marked hydrocephalus involving the third and lateral ventricles with normal fourth ventricle. Findings consistent with aqueductal stenosis. No acute intracranial findings, mass lesions or extra-axial fluid collections. Vascular: No vascular calcifications or hyperdense vessels. Skull: No skull fracture or bone lesions. Right frontal ventriculostomy site is noted. This is new since the prior head CT. No drainage catheter is identified. Sinuses/Orbits: The paranasal sinuses and mastoid air cells are clear Other: No scalp lesions or hematoma. IMPRESSION: 1. Persistent marked hydrocephalus involving the third and lateral ventricles with normal fourth ventricle. Findings consistent with aqueductal stenosis. 2. Right frontal ventriculostomy site. No drainage catheter is identified. 3. No acute intracranial findings or mass lesions.  Electronically Signed   By: Marijo Sanes M.D.   On: 09/02/2020 07:38   DG Chest Port 1 View  Result Date: 09/07/2020 CLINICAL DATA:  Respiratory distress EXAM: PORTABLE CHEST 1 VIEW COMPARISON:  09/05/2020 FINDINGS: Nasogastric tube has been removed. Focal pulmonary infiltrate within the left mid and lower lung zone has improved in the interval, though has not yet completely resolved. Pulmonary insufflation is normal and symmetric. No pneumothorax or pleural effusion. Cardiac size within normal limits. Pulmonary vascularity is normal. IMPRESSION: Improving left lung pulmonary infiltrate. Electronically Signed   By: Fidela Salisbury MD   On: 09/07/2020 06:48   DG Chest Port 1 View  Result Date: 09/05/2020 CLINICAL DATA:  Respiratory failure EXAM: PORTABLE CHEST 1 VIEW COMPARISON:  In 09/04/2020 FINDINGS: Endotracheal tube and nasogastric tube are unchanged. Lung volumes are small and pulmonary insufflation has decreased slightly since prior examination. Focal pulmonary infiltrate within the left mid and lower lung zone has progressed in the interval. No pneumothorax or pleural effusion. Cardiac size within normal limits. Pulmonary vascularity is normal. No acute bone abnormality. IMPRESSION: Progressive left mid and lower lung zone pulmonary infiltrate, likely infectious in the acute setting. Diminishing pulmonary insufflation. Stable support tubes. Electronically Signed   By: Fidela Salisbury MD   On: 09/05/2020 06:18   DG Chest Port 1 View  Result Date: 09/04/2020 CLINICAL DATA:  Respiratory failure. EXAM: PORTABLE CHEST 1 VIEW COMPARISON:  09/02/2020 FINDINGS: 0555 hours. Low lung volumes. Cardiopericardial silhouette is at upper limits of normal for size. There is pulmonary vascular congestion without overt pulmonary edema. Endotracheal tube tip is 6.9 cm above the base of the carina. The NG tube passes into the stomach although the distal tip position is not included on the film. Telemetry leads  overlie the chest. IMPRESSION: 1. Low volume film with vascular congestion. 2. Endotracheal tube tip is 6.9 cm above the base of the carina. Electronically Signed   By: Misty Stanley M.D.   On: 09/04/2020 09:58   DG Chest Portable 1 View  Result Date: 09/02/2020 CLINICAL DATA:  Tachycardia. Intubation. EXAM: PORTABLE CHEST 1 VIEW COMPARISON:  Earlier film, same date. FINDINGS: The endotracheal tube is 5 cm above the carina. The NG tube is coursing down the esophagus and into the stomach. The lungs are clear of an acute process. No pleural effusions. IMPRESSION: 1. Endotracheal tube and NG tubes in good position. 2. No acute cardiopulmonary findings. Electronically Signed   By: Marijo Sanes M.D.   On: 09/02/2020 08:36   DG Chest Portable 1 View  Result Date: 09/02/2020 CLINICAL DATA:  Shortness of breath EXAM: PORTABLE CHEST 1 VIEW COMPARISON:  Chest x-ray 06/22/2020 FINDINGS: The cardiac silhouette, mediastinal and hilar contours are within normal limits given the AP projection, portable technique and supine position of the patient. The lungs are clear of an acute process. No pulmonary lesions. The bony thorax is intact. IMPRESSION: No acute cardiopulmonary findings. Electronically Signed   By: Rudie Meyer M.D.   On: 09/02/2020 06:38   DG Swallowing Func-Speech Pathology  Result Date: 09/08/2020 Objective Swallowing Evaluation: Type of Study: MBS-Modified Barium Swallow Study  Patient Details Name: CARMELLO CABINESS MRN: [id number] Date of Birth: [birth year] Today's Date: 09/08/2020 Time: SLP Start Time (ACUTE ONLY): 1212 -SLP Stop Time (ACUTE ONLY): 1226 SLP Time Calculation (min) (ACUTE ONLY): 14 min Past Medical History: Past Medical History: Diagnosis Date . Anxiety  . Bipolar disorder (HCC)  . Depression  . Diabetes mellitus without complication (HCC)  . 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 Hydrocephalus (HCC)  . Kidney stones  . Seizures (HCC)  Past Surgical  History: Past Surgical History: Procedure Laterality Date . KIDNEY STONE SURGERY   HPI: Pt is a 34 yo male admitted with AMS, sonorous respirations, and concern for aspiration in the setting of DKA. Pt was intubated 11/12; self-extubated 11/16. PMH includes: DM type 1, anxiety, bipolar, depression, DM neuropathy, nephrolithiasis, seizures, hydrocephalus  Subjective: pt is eager for POs Assessment / Plan / Recommendation CHL IP CLINICAL IMPRESSIONS 09/08/2020 Clinical Impression Pt's oropharyngeal swallow appeared to be functional until he started to consume large, sequential boluses of thin liquids via straw. Given reduced coordination and likely reduced glottic closure, pt had aspiration that was silent in nature. He had much better coordination with thin liquids via cup sips, suspect in part because he requires assist for feeding and therefore SLP could better control his rate. Recommend starting mechanical soft diet in light of dentition with thin liquids via single cup sips. Pt would benefit from full supervision and medications whole in puree. Will f/u for tolerance, with swallowing suspected to continue to improve with more time post-extubation.  SLP Visit Diagnosis Dysphagia, unspecified (R13.10) Attention and concentration deficit following -- Frontal lobe and executive function deficit following -- Impact on safety and function Mild aspiration risk   CHL IP TREATMENT RECOMMENDATION 09/08/2020 Treatment Recommendations Therapy as outlined in treatment plan below   Prognosis 09/08/2020 Prognosis for Safe Diet Advancement Good Barriers to Reach Goals -- Barriers/Prognosis Comment -- CHL IP DIET RECOMMENDATION 09/08/2020 SLP Diet Recommendations Dysphagia 3 (Mech soft) solids;Thin liquid Liquid Administration via Cup;No straw Medication Administration Whole meds with puree Compensations Minimize environmental distractions;Slow rate Postural Changes Seated upright at 90 degrees   CHL IP OTHER RECOMMENDATIONS  09/08/2020 Recommended Consults -- Oral Care Recommendations Oral care BID Other Recommendations --   CHL IP FOLLOW UP RECOMMENDATIONS 09/08/2020 Follow up Recommendations Inpatient Rehab   CHL IP FREQUENCY AND DURATION 09/08/2020 Speech Therapy Frequency (ACUTE ONLY) min 2x/week Treatment Duration 2 weeks      CHL IP ORAL PHASE 09/08/2020 Oral Phase WFL Oral - Pudding Teaspoon -- Oral - Pudding Cup -- Oral - Honey Teaspoon -- Oral - Honey Cup -- Oral - Nectar Teaspoon -- Oral - Nectar Cup -- Oral - Nectar Straw -- Oral - Thin Teaspoon -- Oral - Thin Cup -- Oral - Thin Straw -- Oral - Puree -- Oral - Mech Soft -- Oral - Regular -- Oral - Multi-Consistency -- Oral - Pill -- Oral Phase - Comment --  CHL IP PHARYNGEAL PHASE 09/08/2020 Pharyngeal Phase Impaired Pharyngeal- Pudding Teaspoon -- Pharyngeal -- Pharyngeal- Pudding Cup -- Pharyngeal -- Pharyngeal- Honey Teaspoon -- Pharyngeal -- Pharyngeal- Honey Cup -- Pharyngeal -- Pharyngeal- Nectar Teaspoon -- Pharyngeal -- Pharyngeal- Nectar Cup -- Pharyngeal -- Pharyngeal- Nectar Straw -- Pharyngeal -- Pharyngeal- Thin Teaspoon -- Pharyngeal -- Pharyngeal- Thin Cup WFL Pharyngeal -- Pharyngeal- Thin Straw Penetration/Aspiration before swallow;Reduced airway/laryngeal closure Pharyngeal Material enters airway, passes BELOW cords without attempt by patient to eject out (silent aspiration) Pharyngeal- Puree WFL Pharyngeal -- Pharyngeal- Mechanical Soft WFL Pharyngeal -- Pharyngeal- Regular -- Pharyngeal -- Pharyngeal- Multi-consistency -- Pharyngeal -- Pharyngeal- Pill -- Pharyngeal -- Pharyngeal Comment --  CHL IP CERVICAL ESOPHAGEAL PHASE 09/08/2020 Cervical Esophageal Phase WFL Pudding Teaspoon -- Pudding Cup -- Honey Teaspoon -- Honey Cup -- Nectar Teaspoon -- Nectar Cup -- Nectar Straw -- Thin Teaspoon -- Thin Cup -- Thin Straw -- Puree -- Mechanical Soft -- Regular -- Multi-consistency -- Pill -- Cervical Esophageal Comment -- Osie Bond., M.A. CCC-SLP Acute  Rehabilitation Services Pager [phone] Office [phone] 09/08/2020, 1:18 PM               Microbiology: Recent Results (from the past 240 hour(s))  Culture, blood (Routine X 2) w Reflex to ID Panel     Status: None (Preliminary result)   Collection Time: 09/26/20 10:09 AM   Specimen: BLOOD  Result Value Ref Range Status   Specimen Description BLOOD RIGHT ANTECUBITAL  Final   Special Requests   Final    BOTTLES DRAWN AEROBIC AND ANAEROBIC Blood Culture adequate volume   Culture   Final    NO GROWTH 4 DAYS Performed at [facility], [address] [place], [place] [address]    Report Status PENDING  Incomplete  Culture, blood (Routine X 2) w Reflex to ID Panel     Status: None (Preliminary result)   Collection Time: 09/26/20 10:19 AM   Specimen: BLOOD  Result Value Ref Range Status   Specimen Description BLOOD LEFT ANTECUBITAL  Final   Special Requests   Final    BOTTLES DRAWN AEROBIC AND ANAEROBIC Blood Culture results may not be optimal due to an inadequate volume of blood received in culture bottles   Culture   Final    NO GROWTH 4 DAYS Performed at [facility], [place] [address], [place], [place] [address]    Report Status PENDING  Incomplete     Labs: Basic Metabolic Panel: Recent Labs  Lab 09/24/20 0205 09/26/20 1009  NA 136 139  K 3.9 4.1  CL 98 99  CO2 27 28  GLUCOSE 270* 153*  BUN 15 15  CREATININE 0.87 0.94  CALCIUM 9.6 9.7   Liver Function Tests: Recent Labs  Lab 09/26/20 1009  AST 18  ALT 16  ALKPHOS 71  BILITOT 0.6  PROT 7.2  ALBUMIN 3.6   No results for input(s): LIPASE, AMYLASE in the last 168 hours. No results for input(s): AMMONIA in the last 168 hours. CBC: Recent Labs  Lab 09/26/20 1009  WBC 8.2  NEUTROABS 5.1  HGB 14.3  HCT 41.4  MCV 88.5  PLT 373   Cardiac Enzymes: No results for input(s): CKTOTAL, CKMB, CKMBINDEX, TROPONINI in the last 168 hours. BNP: BNP (last 3 results) No results for input(s): BNP in  the last 8760 hours.  ProBNP (last 3 results) No results for input(s): PROBNP in the last 8760 hours.  CBG: Recent Labs  Lab 09/30/20 0410 09/30/20  0612 09/30/20 1135 09/30/20 1136 09/30/20 1204  GLUCAP 376* 271* 43* 45* 75       Signed:  Erin Hearing ANP [facility] 09/30/2020, 4:21 PM

## 2020-09-30 NOTE — Progress Notes (Addendum)
Occupational Therapy Treatment Patient Details Name: Mark [name] MRN: [id number] DOB: 09/24/1986 Today's Date: 09/30/2020    History of present illness 34 year old male with history of seizures on Depakote, type 1 diabetes with frequent episodes of DKA, hydrocephalus who presents to the emergency department on 11/12 with altered mental status with CBG in 500s and sonorous breath sounds.   OT comments  OT treatment session with focus on self-care re-education, functional transfers, wc mobility, and patient/family education. Friend present at bedside for portion of family education session but did not receive hands on practice with assisting patient with bed mobility, dressing or functional transfers. Patient and family both demonstrate decreased understanding of current deficits and timeline for rehab. Patient also resistant to some education including replacing shoelaces to increase ease with donning/doffing footwear and use of compensatory techniques. Patient currently Max A to don footwear. Per this therapists functional assessment and clinical judgement, patient returning home at ambulatory level is not feasible at this time. If SNF placement is not an option, patient would likely require transfer board, ramp, wc, tub/bench, drop-arm BSC, and 24hr supervision/assist for safe d/c home. Patient expressed verbal understanding but will likely require continued edcuation. Patient currently demonstrates deficits including decreased static sitting/standing balance, generalized weakness, and decreased cognition requiring Mod A for bed mobility, Min guard to min A to maintain sitting balance, and Mod to Max A +1 for squat-pivot transfers to wc. Once seated in wc, patient education on safety including locking/unlocking breaks and forward/lateral/backward propulsion with occasional cues and good carryover of instruction. Patient would benefit from continued acute OT services to maximize safety and independence  with ADLs from wc level, decrease risk of falls, and decrease caregiver burden. Patient could likely reach Min to Mod A+1 for self-care tasks from wc level. 4-5 STE home also continue to be a barrier with no immediate plans for ramp installation. Home measurement sheet to be provided to friend at time of next session.    Follow Up Recommendations  SNF    Equipment Recommendations  Tub/shower bench;Other (comment);Wheelchair (measurements OT);Wheelchair cushion (measurements OT) (transfer board, drop-arm BSC, ramp)    Recommendations for Other Services Rehab consult    Precautions / Restrictions Precautions Precautions: Fall Restrictions Weight Bearing Restrictions: No       Mobility Bed Mobility Overal bed mobility: Needs Assistance Bed Mobility: Supine to Sit;Sit to Supine     Supine to sit: Mod assist Sit to supine: Mod assist;HOB elevated   General bed mobility comments: Mod A for supine <> EOB with cues for technique and hand placement.  Transfers Overall transfer level: Needs assistance Equipment used: None Transfers: Squat Pivot Transfers Sit to Stand: +2 physical assistance;Mod assist Stand pivot transfers: +2 physical assistance;Max assist;+2 safety/equipment Squat pivot transfers: Mod assist;Max assist     General transfer comment: Mod A for squat-pivot transfer to wc from elevated EOB and Max A for uphill transfer from wc to EOB with cues for hand/foot placement, anterior lean, and sequencing.    Balance Overall balance assessment: Needs assistance Sitting-balance support: Bilateral upper extremity supported;Feet supported Sitting balance-Leahy Scale: Poor Sitting balance - Comments: Min A to maintain static sitting balance at EOB. Postural control: Posterior lean;Right lateral lean Standing balance support: Bilateral upper extremity supported Standing balance-Leahy Scale: Zero Standing balance comment: Bilat UE support and maxAx2 with knee block for static  standing x1 bout of ~20 sec following transfer to chair.  ADL either performed or assessed with clinical judgement   ADL                           Toilet Transfer: Moderate assistance;Squat-pivot Toilet Transfer Details (indicate cue type and reason): Simulated with squat-pivot transfer EOB <> wc with verbal cues for sequencing.                 Vision       Perception     Praxis      Cognition Arousal/Alertness: Awake/alert Behavior During Therapy: WFL for tasks assessed/performed;Flat affect Overall Cognitive Status: Impaired/Different from baseline Area of Impairment: Safety/judgement;Awareness;Problem solving                         Safety/Judgement: Decreased awareness of safety;Decreased awareness of deficits Awareness: Emergent Problem Solving: Slow processing;Decreased initiation;Requires tactile cues;Requires verbal cues;Difficulty sequencing General Comments: Patient continues to demonstrate decreased safety awareness and decreased awareness of current deficits.        Exercises     Shoulder Instructions       General Comments Patient/family education on CLOF. Patient/family with continued unawareness of full extent of patient's deficits.    Pertinent Vitals/ Pain       Pain Assessment: No/denies pain Faces Pain Scale: Hurts little more Pain Location: L hip flexor region with movement Pain Descriptors / Indicators: Discomfort;Grimacing Pain Intervention(s): Limited activity within patient's tolerance;Monitored during session;Repositioned  Home Living                                          Prior Functioning/Environment              Frequency  Min 2X/week        Progress Toward Goals  OT Goals(current goals can now be found in the care plan section)  Progress towards OT goals: Progressing toward goals  Acute Rehab OT Goals Patient Stated Goal: To return  home. OT Goal Formulation: With patient Time For Goal Achievement: 10/07/20 Potential to Achieve Goals: Fair ADL Goals Pt Will Perform Grooming: with supervision;sitting Pt Will Perform Lower Body Bathing: with min assist;sit to/from stand Pt Will Perform Upper Body Dressing: with supervision;sitting Pt Will Perform Lower Body Dressing: with min assist;sit to/from stand Pt Will Transfer to Toilet: with min assist;stand pivot transfer;bedside commode Pt/caregiver will Perform Home Exercise Program: Increased strength;Increased ROM;Both right and left upper extremity;With minimal assist;With written HEP provided Additional ADL Goal #1: Pt will maintain static sitting balance with minguard assist > 10 min as precursor to ADL. Additional ADL Goal #2: Pt will follow multi step commands with 75% accuracy and no more than min cues.  Plan Discharge plan remains appropriate;Frequency remains appropriate    Co-evaluation                 AM-PAC OT "6 Clicks" Daily Activity     Outcome Measure   Help from another person eating meals?: None Help from another person taking care of personal grooming?: A Little Help from another person toileting, which includes using toliet, bedpan, or urinal?: A Lot Help from another person bathing (including washing, rinsing, drying)?: A Lot Help from another person to put on and taking off regular upper body clothing?: A Little Help from another person to put on and taking off regular lower body clothing?: A  Lot 6 Click Score: 16    End of Session Equipment Utilized During Treatment: Gait belt (wheelchair)  OT Visit Diagnosis: Other abnormalities of gait and mobility (R26.89);Muscle weakness (generalized) (M62.81);Unsteadiness on feet (R26.81);Other symptoms and signs involving cognitive function   Activity Tolerance Patient tolerated treatment well   Patient Left in bed;with call bell/phone within reach;with bed alarm set   Nurse Communication Other  (comment) (Prohibited items in patient room.)        Time: 1601-0932 OT Time Calculation (min): 40 min  Charges: OT General Charges $OT Visit: 1 Visit OT Treatments $Therapeutic Activity: 38-52 mins  Faraz Ponciano H. OTR/L Supplemental OT, Department of rehab services [phone]   Conny Situ R H. 09/30/2020, 2:16 PM

## 2020-09-30 NOTE — Progress Notes (Signed)
Subjective: Asked to re-evaluate MR Hibler due to his LE weakness  Exam: Vitals:   09/30/20 1143 09/30/20 1551  BP: 131/85 133/76  Pulse: (!) 110 95  Resp: 18 18  Temp: 98.2 F (36.8 C) 99.3 F (37.4 C)  SpO2: 99% 98%   Gen: In bed, NAD Resp: non-labored breathing, no acute distress Abd: soft, nt  Neuro: MS: awake, alert, interactive and appropriate, Oriented to year, initially gives month as November before correcting himself.  CN: EOMI, Face symmetric Motor: He has 5/5 strength in bilateral UE, 4-/5 proximal and 4+/5 distal weakness in the RLE, 4+/5 weakness of the lle Sensory:intact to LT and temp, mildly reducecd vibration at the toes.  DTR: 2+ and symemtric in the UE, 3+ with crossed adductors at the knees, 1+ at the ankles, Upgoing toe on the left.   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 I would favor further workup with MRI T-Spine and possibly further metabolic workup depending on MRI results. He refuses any further testing, even if there is risk, stating that he would get it done at a later time. He has been deemed to have competency and understands recommendation/risks.   Recommendations: 1) MRI thoracic spine(pt refuses)  Ritta Slot, MD [facility] [phone]  If 7pm- 7am, please page neurology on call as listed in AMION.

## 2020-09-30 NOTE — TOC Transition Note (Addendum)
Transition of Care [facility]) - CM/SW Discharge Note   Patient Details  Name: Mark [name] MRN: [id number] Date of Birth: 1985/12/19  [facility] of [facility]) CM/SW Contact:  Kermit Balo, RN Phone Number: 09/30/2020, 4:22 PM   Clinical Narrative:    Pt is discharging home once we are able to reach his roommate. CM has gotten approval for LOG wheelchair through [employer] that will be delivered to his home.  Medications for home to be delivered to the room. CM provided MATCH with copays overridden.  PCP appt scheduled through [facility].  Pt will need PTAR home in am. CM has updated CSW that will be available to arrange this in am.   Final next level of care: Home/Self Care Barriers to Discharge: Inadequate or no insurance,Barriers Unresolved (comment)   Patient Goals and CMS Choice     Choice offered to / list presented to : Patient  Discharge Placement                       Discharge Plan and Services                DME Arranged: Wheelchair manual DME Agency: [employer] Date DME Agency Contacted: 09/30/20   Representative spoke with at DME Agency: LOG wheelchair that will be delivered to his home--[facility] HH Arranged: PT,OT,Speech Therapy HH Agency: [facility] Date HH Agency Contacted: 09/10/20 Time HH Agency Contacted: 1523 Representative spoke with at [facility] Agency: [name]  Social Determinants of Health (SDOH) Interventions     Readmission Risk Interventions Readmission Risk Prevention Plan 03/25/2020  Transportation Screening Complete  Medication Review Oceanographer) Complete  PCP or Specialist appointment within 3-5 days of discharge Complete  HRI or Home Care Consult (No Data)  SW Recovery Care/Counseling Consult Complete  Palliative Care Screening Not Applicable  Skilled Nursing Facility Not Applicable  Some recent data might be hidden

## 2020-10-01 ENCOUNTER — Other Ambulatory Visit: Payer: Self-pay

## 2020-10-01 ENCOUNTER — Emergency Department (HOSPITAL_COMMUNITY)
Admission: EM | Admit: 2020-10-01 | Discharge: 2020-10-02 | Discharge status: Against Medical Advice | Payer: Medicaid Other | Attending: Emergency Medicine | Admitting: Emergency Medicine

## 2020-10-01 DIAGNOSIS — F17210 Nicotine dependence, cigarettes, uncomplicated: Secondary | ICD-10-CM | POA: Insufficient documentation

## 2020-10-01 DIAGNOSIS — Z5320 Procedure and treatment not carried out because of patient's decision for unspecified reasons: Secondary | ICD-10-CM | POA: Diagnosis not present

## 2020-10-01 DIAGNOSIS — F1210 Cannabis abuse, uncomplicated: Secondary | ICD-10-CM | POA: Insufficient documentation

## 2020-10-01 DIAGNOSIS — I824Y1 Acute embolism and thrombosis of unspecified deep veins of right proximal lower extremity: Secondary | ICD-10-CM | POA: Insufficient documentation

## 2020-10-01 DIAGNOSIS — R5383 Other fatigue: Secondary | ICD-10-CM | POA: Diagnosis not present

## 2020-10-01 DIAGNOSIS — R42 Dizziness and giddiness: Secondary | ICD-10-CM | POA: Diagnosis not present

## 2020-10-01 DIAGNOSIS — E039 Hypothyroidism, unspecified: Secondary | ICD-10-CM | POA: Diagnosis not present

## 2020-10-01 DIAGNOSIS — E109 Type 1 diabetes mellitus without complications: Secondary | ICD-10-CM | POA: Insufficient documentation

## 2020-10-01 DIAGNOSIS — R531 Weakness: Secondary | ICD-10-CM | POA: Insufficient documentation

## 2020-10-01 LAB — CBC
HCT: 43.2 % (ref 39.0–52.0)
Hemoglobin: 14 g/dL (ref 13.0–17.0)
MCH: 29.8 pg (ref 26.0–34.0)
MCHC: 32.4 g/dL (ref 30.0–36.0)
MCV: 91.9 fL (ref 80.0–100.0)
Platelets: 243 10*3/uL (ref 150–400)
RBC: 4.7 MIL/uL (ref 4.22–5.81)
RDW: 12.7 % (ref 11.5–15.5)
WBC: 13.4 10*3/uL — ABNORMAL HIGH (ref 4.0–10.5)
nRBC: 0 % (ref 0.0–0.2)

## 2020-10-01 LAB — CBG MONITORING, ED: Glucose-Capillary: 107 mg/dL — ABNORMAL HIGH (ref 70–99)

## 2020-10-01 LAB — BASIC METABOLIC PANEL
Anion gap: 14 (ref 5–15)
BUN: 15 mg/dL (ref 6–20)
CO2: 23 mmol/L (ref 22–32)
Calcium: 9.5 mg/dL (ref 8.9–10.3)
Chloride: 103 mmol/L (ref 98–111)
Creatinine, Ser: 0.91 mg/dL (ref 0.61–1.24)
GFR, Estimated: >60 mL/min (ref >60)
Glucose, Bld: 80 mg/dL (ref 70–99)
Potassium: 3.6 mmol/L (ref 3.5–5.1)
Sodium: 140 mmol/L (ref 135–145)

## 2020-10-01 LAB — GLUCOSE, CAPILLARY
Glucose-Capillary: 226 mg/dL — ABNORMAL HIGH (ref 70–99)
Glucose-Capillary: 165 mg/dL — ABNORMAL HIGH (ref 70–99)

## 2020-10-01 LAB — CULTURE, BLOOD (ROUTINE X 2)
Culture: NO GROWTH
Culture: NO GROWTH
Special Requests: ADEQUATE

## 2020-10-01 NOTE — ED Triage Notes (Signed)
Pt here from home after being d/c at 2pm today from a month long hospital stay. Pt reports he is unable to walk or take care of himself so would like to talk to social worker regarding home health. Had near syncopal episode when standing from bed to wheelchair. 400 NS bolus given PTA.

## 2020-10-01 NOTE — Progress Notes (Signed)
Pt refused vital signs.

## 2020-10-01 NOTE — ED Notes (Signed)
Pt was taken back to a room and changed. Pt returned to lobby in a recliner.

## 2020-10-01 NOTE — Progress Notes (Signed)
Patient refused 0000 and 0400 vital signs. Was educated about the importance of vital signs

## 2020-10-01 NOTE — ED Notes (Addendum)
Staff made this nurse tech aware that pt had fell out of wheelchair. Upon arrival this tech noted pt to be laying on floor. Pt states he has neuropathy in his legs & fell trying to get his shoes on. This tech, a EMT, & staff member assisted pt back to standing position and into wheelchair, pt denies hitting head or LOC. Triage nurse aware.

## 2020-10-01 NOTE — Progress Notes (Addendum)
See full discharge summary done on 09/30/2020 by Ms. Junious Silk, NP.   No changes made to patient's medications or discharge instructions/recommendations. Patient is very adamant about going home and no longer wants to be in the hospital.  We recommended that patient stay until he is physically stronger however he refuses. He is aware of the risks of going home but states he knows when to take his medications and when to seek medical help. Ms. Rennis Harding had a long conversation with patient's roommate on 09/30/2020 who was excepted for him to come home.  Patient has been refusing vital signs and other work-up.  He has refused possible SNF placement.  He has been very verbally abusive and combative to the staff.   Patient is medically stable for discharge.  Follow-up has been placed in patient's chart.  Medications have been ordered and at the nurses station.  PTAR has also been set up for transportation.   Koi Zangara D.O. [facility] 10/01/2020, 9:18 AM

## 2020-10-01 NOTE — Progress Notes (Signed)
PT Cancellation Note  Patient Details Name: Mark [name] MRN: [id number] DOB: 1986/04/12   Cancelled Treatment:    Reason Eval/Treat Not Completed: Other (comment) (pt on stretcher leaving hospital upon arrival). Pt just got on stretcher and leaving hospital upon arrival for session.  [name], PT, DPT Acute Rehabilitation Services  Pager: [phone] Office: [phone]  [name] 10/01/2020, 11:56 AM

## 2020-10-02 ENCOUNTER — Emergency Department (HOSPITAL_COMMUNITY): Payer: Medicaid Other

## 2020-10-02 ENCOUNTER — Emergency Department (HOSPITAL_BASED_OUTPATIENT_CLINIC_OR_DEPARTMENT_OTHER): Payer: Medicaid Other

## 2020-10-02 DIAGNOSIS — R609 Edema, unspecified: Secondary | ICD-10-CM | POA: Diagnosis not present

## 2020-10-02 LAB — URINALYSIS, ROUTINE W REFLEX MICROSCOPIC
Bacteria, UA: NONE SEEN
Bilirubin Urine: NEGATIVE
Glucose, UA: >=500 mg/dL — AB
Hgb urine dipstick: NEGATIVE
Ketones, ur: 5 mg/dL — AB
Leukocytes,Ua: NEGATIVE
Nitrite: NEGATIVE
Protein, ur: NEGATIVE mg/dL
Specific Gravity, Urine: 1.026 (ref 1.005–1.030)
pH: 7 (ref 5.0–8.0)

## 2020-10-02 LAB — HEPATIC FUNCTION PANEL
ALT: 20 U/L (ref 0–44)
AST: 31 U/L (ref 15–41)
Albumin: 3.5 g/dL (ref 3.5–5.0)
Alkaline Phosphatase: 72 U/L (ref 38–126)
Bilirubin, Direct: <0.1 mg/dL (ref 0.0–0.2)
Total Bilirubin: 0.9 mg/dL (ref 0.3–1.2)
Total Protein: 6.6 g/dL (ref 6.5–8.1)

## 2020-10-02 LAB — CBG MONITORING, ED
Glucose-Capillary: 572 mg/dL (ref 70–99)
Glucose-Capillary: 581 mg/dL (ref 70–99)

## 2020-10-02 LAB — LIPASE, BLOOD: Lipase: 19 U/L (ref 11–51)

## 2020-10-02 MED ORDER — RIVAROXABAN 20 MG PO TABS: 20.0000 mg | ORAL_TABLET | Freq: Every day | ORAL | Status: DC

## 2020-10-02 MED ORDER — RIVAROXABAN (XARELTO) VTE STARTER PACK (15 & 20 MG): ORAL_TABLET | ORAL | 0 refills | Status: DC

## 2020-10-02 MED ORDER — RIVAROXABAN 15 MG PO TABS
15.0000 mg | ORAL_TABLET | Freq: Two times a day (BID) | ORAL | Status: DC
  Administered 2020-10-02: 13:00:00 15 mg via ORAL
  Filled 2020-10-02: qty 1

## 2020-10-02 MED ORDER — DOXYCYCLINE HYCLATE 100 MG PO CAPS: 100.0000 mg | ORAL_CAPSULE | Freq: Two times a day (BID) | ORAL | 0 refills | Status: AC

## 2020-10-02 MED ORDER — IOHEXOL 350 MG/ML SOLN
70.0000 mL | Freq: Once | INTRAVENOUS | Status: AC | PRN
  Administered 2020-10-02: 11:00:00 70 mL via INTRAVENOUS

## 2020-10-02 MED ORDER — INSULIN ASPART 100 UNIT/ML ~~LOC~~ SOLN
10.0000 [IU] | Freq: Once | SUBCUTANEOUS | Status: AC
  Administered 2020-10-02: 15:00:00 10 [IU] via SUBCUTANEOUS

## 2020-10-02 MED ORDER — DOXYCYCLINE HYCLATE 100 MG PO TABS
100.0000 mg | ORAL_TABLET | Freq: Once | ORAL | Status: AC
  Administered 2020-10-02: 13:00:00 100 mg via ORAL
  Filled 2020-10-02: qty 1

## 2020-10-02 NOTE — ED Notes (Signed)
Brittanty Leonard Schwartz RN made aware of cbg.

## 2020-10-02 NOTE — TOC Progression Note (Signed)
Transition of Care [facility]) - Progression Note    Patient Details  Name: Mark [name] MRN: [id number] Date of Birth: January 17, 1986  [facility]) CM/SW Contact  Annalee Genta, LCSW Phone Number: 10/02/2020, 1:05 PM  Clinical Narrative: CSW spoke with provider and RNCM regarding possible SNF for patient. CSW introduced self and role to the patient. Patient reported he did not want to stay but has difficulty around the house. Patient reports he has been able to transfer to and from his wheel chair. CSW discussed SNF referral process with patient and discussed what the process would be with no current insurance coverage. CSW noted patient did not out-right decline SNF referral but would much rather prefer to go home with home health PT. From discussions with RN CM they did search for charity home health for patient during prior hospitalizations but were unsuccessful. CSW will follow-up with RNCM,       Expected Discharge Plan and Services                                                 Social Determinants of Health (SDOH) Interventions    Readmission Risk Interventions Readmission Risk Prevention Plan 03/25/2020  Transportation Screening Complete  Medication Review Oceanographer) Complete  PCP or Specialist appointment within 3-5 days of discharge Complete  HRI or Home Care Consult (No Data)  SW Recovery Care/Counseling Consult Complete  Palliative Care Screening Not Applicable  Skilled Nursing Facility Not Applicable  Some recent data might be hidden

## 2020-10-02 NOTE — Discharge Instructions (Signed)
You were seen in the emergency department today with weakness and leg pain.  Your right leg has a blood clot in it and you need to start taking blood thinning medicines.  These were called over to your pharmacy along with an antibiotic which are available to pick up free of charge.  We have also arranged for home health and physical therapy.  They should be reaching out to you by phone to set up the initial visit.  Please follow closely with the primary care doctor and call the resources listed above.

## 2020-10-02 NOTE — ED Notes (Signed)
Pt given diet coke. 

## 2020-10-02 NOTE — ED Notes (Signed)
Pt noted to have iv pulled out.

## 2020-10-02 NOTE — ED Notes (Signed)
ED Provider at bedside. 

## 2020-10-02 NOTE — ED Notes (Signed)
Dr. Jacqulyn Bath spoke w/ pt and he is agreeable to stay at this time.

## 2020-10-02 NOTE — ED Notes (Signed)
Patient wanting to leave AMA. Provider made aware.

## 2020-10-02 NOTE — ED Notes (Signed)
Provider aware of Blood sugar

## 2020-10-02 NOTE — Progress Notes (Addendum)
VASCULAR LAB    Right lower extremity venous duplex has been performed.  See CV proc for preliminary results.  Gave verbal report to Dr. Maury Dus, [facility], RVT 10/02/2020, 8:59 AM

## 2020-10-02 NOTE — ED Notes (Signed)
Pt called out saying, "Give me a buss pass, I'm going to get the hell up out of here". Told pt that I would have the MD come speak with him.

## 2020-10-02 NOTE — TOC Initial Note (Addendum)
Transition of Care [facility]) - Initial/Assessment Note    Patient Details  Name: Mark [name] MRN: [id number] Date of Birth: January 22, 1986  Transition of Care [facility] CM/SW Contact:    Lockie Pares, RN Phone Number: 10/02/2020, 8:31 AM  Clinical Narrative:                 Patient came to the ED last night after just being discharged from the hosptial yesterday afternoon. The patient had demanded discharge and CM and CSW had worked with him on obtaining a wheelchair at home for his use via LOG, medications via MATCH without a need for co-pay. Patient has no insurance, Should be in the process of Medicaid. Will alert FC for any needs.    1200 Spoke to Dr. Jacqulyn Bath about patient discharge. MATCH still good fromysterday, [facility] to give ID , BIN and RX group number. Medications went through. Will also provide patient with [facility] card. 1330   Frances Furbish is charity for [facility] this week, called and LM for them to return call.    1420 [facility] accepted for OT PT aide and social work.       Patient will be discharged via PTAR, patient aware of HH and he will [facility]. Spoke about hte importance of taking medications especially xaralto. . 1442 received a call from [facility], this paitent had already been set up with [facility] post discharge yesterday. They will be the ones seeing him on return home.  Called [facility] to verify, they do not have him on their list. There is a note from hospitalization on 11/20 from [facility] stating that [facility] declined due to staffing.   Frances Furbish will follow up in AM   Patient Goals and CMS Choice        Expected Discharge Plan and Services    Services have already been established as far as DME and Medication MATCH. Appointment has been scheduled with [facility].                                             Prior Living Arrangements/Services                       Activities of Daily Living      Permission Sought/Granted                   Emotional Assessment              Admission diagnosis:  Near syncopal episode Patient Active Problem List   Diagnosis Date Noted  . Fever   . Insomnia due to mental disorder   . Acute metabolic encephalopathy   . Bipolar I disorder (HCC)   . Uncontrolled type 1 diabetes mellitus with hyperglycemia (HCC)   . Physical deconditioning   . Ambulatory dysfunction   . AMS (altered mental status) 09/02/2020  . Seizures (HCC)   . Leukocytosis 06/22/2020  . Hyperkalemia 04/10/2020  . AKI (acute kidney injury) (HCC) 04/10/2020  . Left leg cellulitis 03/23/2020  . Bipolar I disorder, current or most recent episode depressed, in partial remission (HCC)   . MDD (major depressive disorder), recurrent episode, severe (HCC) 03/13/2020  . Drug overdose, intentional (HCC) 03/05/2020  . Drug overdose 03/05/2020  . Drug abuse (HCC) 03/05/2020  . Attempted suicide (HCC) 03/05/2020  .  Insulin overdose   . Hypoglycemia 02/03/2020  . Hypothyroidism 02/02/2020  . Diabetic polyneuropathy associated with type 1 diabetes mellitus (HCC) 02/02/2020  . Type 1 diabetes mellitus with hyperosmolar hyperglycemic state (HHS) (HCC) 12/30/2019  . DKA (diabetic ketoacidosis) (HCC) 12/29/2019  . Homelessness 12/29/2019  . Marijuana abuse 12/29/2019  . Tobacco dependence 12/29/2019  . Seizure disorder (HCC) 12/29/2019  . Suicidal ideation   . Bipolar I disorder, most recent episode depressed, severe without psychotic features (HCC)   . Adjustment disorder with depressed mood 12/07/2017  . Noncompliance 10/17/2017  . Dysthymia 10/08/2017  . Personality disorder in adult [facility]) 09/26/2017  . 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 Obstructive hydrocephalus (HCC) 09/19/2017  . MDD (major depressive disorder) 08/22/2017  . Severe recurrent major depression with psychotic features (HCC) 08/19/2017  . Abdominal pain 08/18/2017  . Hyponatremia 08/18/2017  . DKA (diabetic ketoacidoses) 11/24/2015  .  Diabetic keto-acidosis (HCC) 10/23/2015   PCP:  Patient, No Pcp Per Pharmacy:   [name] [place], [place] - [address] [place] [address] Phone: [phone] Fax: [phone]     Social Determinants of Health (SDOH) Interventions    Readmission Risk Interventions Readmission Risk Prevention Plan 03/25/2020  Transportation Screening Complete  Medication Review (RN Care Manager) Complete  PCP or Specialist appointment within 3-5 days of discharge Complete  HRI or Home Care Consult (No Data)  SW Recovery Care/Counseling Consult Complete  Palliative Care Screening Not Applicable  Skilled Nursing Facility Not Applicable  Some recent data might be hidden

## 2020-10-02 NOTE — ED Provider Notes (Addendum)
Emergency Department Provider Note   I have reviewed the triage vital signs and the nursing notes.   HISTORY  Chief Complaint No chief complaint on file.   HPI Mark [name] is a 34 y.o. male with past medical history reviewed below presents to the emergency department with fatigue, weakness, near syncope in setting of recent hospitalization.  Patient had a recent prolonged hospital stay and was ultimately discharged home.  He uses a walker and cane to get around and was also sent home with a wheelchair which he uses.  He states that since getting home he is had fatigue and weakness and been largely unable to care for himself.  He is able to get up and go to the bathroom.  He lives with 2 other individuals and is able to get food and other basic needs met.  He notes some soreness and swelling in his right leg compared to his left.  He denies any chest pain or shortness of breath.  He did have some near syncope type symptoms earlier today but no fully passing out.  Patient states he is hungry and would like something to eat and drink.  He has been taking his medications which are sent home with him at the time of discharge.  He is inquiring about the possibility of home health. No infection symptoms or other symptoms.    Past Medical History:  Diagnosis Date  . Anxiety   . Bipolar disorder ([place])   . Depression   . Diabetes mellitus without complication ([place])   . Diabetic polyneuropathy associated with type 1 diabetes mellitus ([place])   . Hydrocephalus ([place])   . Kidney stones   . Seizures [facility])     Patient Active Problem List   Diagnosis Date Noted  . Fever   . Insomnia due to mental disorder   . Acute metabolic encephalopathy   . Bipolar I disorder ([place])   . Uncontrolled type 1 diabetes mellitus with hyperglycemia ([place])   . Physical deconditioning   . Ambulatory dysfunction   . AMS (altered mental status) 09/02/2020  . Seizures ([place])   . Leukocytosis 06/22/2020  .  Hyperkalemia 04/10/2020  . AKI (acute kidney injury) ([place]) 04/10/2020  . Left leg cellulitis 03/23/2020  . Bipolar I disorder, current or most recent episode depressed, in partial remission ([place])   . MDD (major depressive disorder), recurrent episode, severe ([place]) 03/13/2020  . Drug overdose, intentional ([place]) 03/05/2020  . Drug overdose 03/05/2020  . Drug abuse ([place]) 03/05/2020  . Attempted suicide ([place]) 03/05/2020  . Insulin overdose   . Hypoglycemia 02/03/2020  . Hypothyroidism 02/02/2020  . Diabetic polyneuropathy associated with type 1 diabetes mellitus ([place]) 02/02/2020  . Type 1 diabetes mellitus with hyperosmolar hyperglycemic state (HHS) ([place]) 12/30/2019  . DKA (diabetic ketoacidosis) ([place]) 12/29/2019  . Homelessness 12/29/2019  . Marijuana abuse 12/29/2019  . Tobacco dependence 12/29/2019  . Seizure disorder ([place]) 12/29/2019  . Suicidal ideation   . Bipolar I disorder, most recent episode depressed, severe without psychotic features ([place])   . Adjustment disorder with depressed mood 12/07/2017  . Noncompliance 10/17/2017  . Dysthymia 10/08/2017  . 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 Obstructive hydrocephalus ([place]) 09/19/2017  . MDD (major depressive disorder) 08/22/2017  . Severe recurrent major depression with psychotic features ([place]) 08/19/2017  . Abdominal pain 08/18/2017  . Hyponatremia 08/18/2017  . DKA (diabetic ketoacidoses) 11/24/2015  . Diabetic keto-acidosis ([place]) 10/23/2015    Past  Surgical History:  Procedure Laterality Date  . KIDNEY STONE SURGERY      Allergies Mushroom extract complex, Penicillins, Sulfa antibiotics, and Clindamycin/lincomycin  Family History  Problem Relation Age of Onset  . Breast cancer Mother     Social History Social History   Tobacco Use  . Smoking status: Current Every Day Smoker    Packs/day: 1.00    Years: 20.00    Pack years: 20.00    Types: Cigarettes  . Smokeless  tobacco: Never Used  Vaping Use  . Vaping Use: Never used  Substance Use Topics  . Alcohol use: Yes    Comment: occassional   . Drug use: Yes    Frequency: 7.0 times per week    Types: Marijuana    Comment: smoked 2-3 days ago, usually smokes daily    Review of Systems  Constitutional: No fever/chills. Positive generalized weakness.  Eyes: No visual changes. ENT: No sore throat. Cardiovascular: Denies chest pain. Respiratory: Denies shortness of breath. Gastrointestinal: No abdominal pain.  No nausea, no vomiting.  No diarrhea.  No constipation. Genitourinary: Negative for dysuria. Musculoskeletal: Negative for back pain. Right leg swelling and pain.  Skin: Negative for rash. Neurological: Negative for headaches, focal weakness or numbness.  10-point ROS otherwise negative.  ____________________________________________   PHYSICAL EXAM:  VITAL SIGNS: ED Triage Vitals  Enc Vitals Group     BP 10/01/20 1802 90/72     Pulse Rate 10/01/20 1802 (!) 113     Resp 10/01/20 1802 18     Temp 10/01/20 1802 98.3 F (36.8 C)     Temp Source 10/01/20 1802 Oral     SpO2 10/01/20 1802 96 %     Weight 10/01/20 1802 250 lb (113.4 kg)     Height 10/01/20 1802 _0  (1.88 m)   Constitutional: Alert and oriented. Well appearing and in no acute distress. Eyes: Conjunctivae are normal.  Head: Atraumatic. Nose: No congestion/rhinnorhea. Mouth/Throat: Mucous membranes are moist.  Neck: No stridor.   Cardiovascular: Normal rate, regular rhythm. Good peripheral circulation. Grossly normal heart sounds.   Respiratory: Normal respiratory effort.  No retractions. Lungs CTAB. Gastrointestinal: Soft and nontender. No distention.  Musculoskeletal: Diffuse tenderness over the right leg compared to the left with mild edema noted. No gross deformities of extremities. Neurologic:  Normal speech and language. No gross focal neurologic deficits are appreciated.  Skin:  Skin is warm, dry and intact.  No rash noted.  ____________________________________________   LABS (all labs ordered are listed, but only abnormal results are displayed)  Labs Reviewed  CBC - Abnormal; Notable for the following components:      Result Value   WBC 13.4 (*)    All other components within normal limits  URINALYSIS, ROUTINE W REFLEX MICROSCOPIC - Abnormal; Notable for the following components:   Color, Urine STRAW (*)    Glucose, UA >=500 (*)    Ketones, ur 5 (*)    All other components within normal limits  CBG MONITORING, ED - Abnormal; Notable for the following components:   Glucose-Capillary 107 (*)    All other components within normal limits  URINE CULTURE  BASIC METABOLIC PANEL  HEPATIC FUNCTION PANEL  LIPASE, BLOOD   ____________________________________________  EKG   EKG Interpretation  Date/Time:  Saturday October 01 2020 18:00:01 EST Ventricular Rate:  115 PR Interval:  148 QRS Duration: 74 QT Interval:  330 QTC Calculation: 456 R Axis:   55 Text Interpretation: Sinus tachycardia Otherwise normal ECG No  STEMI Confirmed by Nanda Quinton [phone]) on 10/02/2020 7:53:45 AM       ____________________________________________  RADIOLOGY  CT Angio Chest PE W and/or Wo Contrast  Result Date: 10/02/2020 CLINICAL DATA:  Near syncope. Concern for pulmonary embolism. Lower extremity DVT. EXAM: CT ANGIOGRAPHY CHEST WITH CONTRAST TECHNIQUE: Multidetector CT imaging of the chest was performed using the standard protocol during bolus administration of intravenous contrast. Multiplanar CT image reconstructions and MIPs were obtained to evaluate the vascular anatomy. CONTRAST:  18m OMNIPAQUE IOHEXOL 350 MG/ML SOLN COMPARISON:  None. FINDINGS: Cardiovascular: No filling defects within the pulmonary arteries to suggest acute pulmonary embolism. No significant vascular findings. Normal heart size. No pericardial effusion. Mediastinum/Nodes: No axillary or supraclavicular adenopathy. No  mediastinal adenopathy. Borderline enlarged hilar lymph nodes. No pericardial fluid. Esophagus normal. Lungs/Pleura: Fine nodular peri bronchovascular pattern in the LEFT lower lobe. Pattern involves the entirety of the LEFT lower lobe. No pleural fluid. No pneumothorax Upper Abdomen: Limited view of the liver, kidneys, pancreas are unremarkable. Normal adrenal glands. Musculoskeletal: No aggressive osseous lesion. Review of the MIP images confirms the above findings. IMPRESSION: 1. No evidence acute pulmonary embolism. 2. Fine peribronchovascular nodular pattern within the LEFT lower lobe suggest mild/early pulmonary infection. Electronically Signed   By: SSuzy BouchardM.D.   On: 10/02/2020 11:25   DG Chest Portable 1 View  Result Date: 10/02/2020 CLINICAL DATA:  Leukocytosis per order.leukocytosis EXAM: PORTABLE CHEST 1 VIEW COMPARISON:  None. FINDINGS: Normal mediastinum and cardiac silhouette. Normal pulmonary vasculature. No evidence of effusion, infiltrate, or pneumothorax. No acute bony abnormality. IMPRESSION: No acute cardiopulmonary process. Electronically Signed   By: SSuzy BouchardM.D.   On: 10/02/2020 08:28   VAS [place]OWER EXTREMITY VENOUS (DVT) (ONLY MC & WL 7a-7p)  Result Date: 10/02/2020  Lower Venous DVT Study Indications: Edema. Other Indications: Near syncope. Comparison Study: No prior RLE venous duplex on file Performing Technologist: CSharion DoveRVS  Examination Guidelines: A complete evaluation includes B-mode imaging, spectral Doppler, color Doppler, and power Doppler as needed of all accessible portions of each vessel. Bilateral testing is considered an integral part of a complete examination. Limited examinations for reoccurring indications may be performed as noted. The reflux portion of the exam is performed with the patient in reverse Trendelenburg.  +---------+---------------+---------+-----------+----------+--------------+ RIGHT     CompressibilityPhasicitySpontaneityPropertiesThrombus Aging +---------+---------------+---------+-----------+----------+--------------+ CFV      Full           Yes      Yes                                 +---------+---------------+---------+-----------+----------+--------------+ SFJ      Full                                                        +---------+---------------+---------+-----------+----------+--------------+ FV Prox  Full                                                        +---------+---------------+---------+-----------+----------+--------------+ FV Mid   Full                                                        +---------+---------------+---------+-----------+----------+--------------+  FV DistalNone                                         Acute          +---------+---------------+---------+-----------+----------+--------------+ PFV      Full                                                        +---------+---------------+---------+-----------+----------+--------------+ POP      None           No       No                   Acute          +---------+---------------+---------+-----------+----------+--------------+ PTV      None                                         Acute          +---------+---------------+---------+-----------+----------+--------------+ PERO     None                                         Acute          +---------+---------------+---------+-----------+----------+--------------+ Gastroc  None                                         Acute          +---------+---------------+---------+-----------+----------+--------------+   +----+---------------+---------+-----------+----------+--------------+ LEFTCompressibilityPhasicitySpontaneityPropertiesThrombus Aging +----+---------------+---------+-----------+----------+--------------+ CFV Full           Yes      Yes                                  +----+---------------+---------+-----------+----------+--------------+     Summary: RIGHT: - Findings consistent with acute deep vein thrombosis involving the right popliteal vein, right posterior tibial veins, right peroneal veins, and right gastrocnemius veins. - Ultrasound characteristics of enlarged lymph nodes are noted in the groin.  LEFT: - No evidence of common femoral vein obstruction.  *See table(s) above for measurements and observations.    Preliminary     ____________________________________________   PROCEDURES  Procedure(s) performed:   Procedures  None  ____________________________________________   INITIAL IMPRESSION / ASSESSMENT AND PLAN / ED COURSE  Pertinent labs & imaging results that were available during my care of the patient were reviewed by me and considered in my medical decision making (see chart for details).   Patient presents to the emergency department for evaluation of generalized weakness, near syncope after recent hospitalization.  He has some soreness and swelling in the right leg.  Plan for DVT ultrasound.  Labs from triage are largely unremarkable.  Initially arrived with some mild tachycardia that is improved without specific intervention.  Patient does have mild leukocytosis with no clear clinical significance to this.  He does not have rash or other clear source of infection  on my exam.  UA is pending.  Have also added on hepatic function and lipase and chest x-ray along with ultrasound of the right lower extremity to evaluate for DVT.  Have placed order for case manager as well as home health.   12:45 PM  Patient has no PE on CT scan.  Question some developing pulmonary infection.  Will start doxycycline but no hypoxemia or indication for admission at this time.  Patient does have a DVT and I have started him on Xarelto after discussing the risks and benefits.  I have coordinated with case manager to have his medicines sent to a local pharmacy where  they can be covered through the [facility] program.  Patient is very adamant about going home.  I reviewed his recent discharge summary.  There was augmentation at that time to undergo MRI of the thoracic spine which patient refused and continues to refuse today.  He does have a wheelchair and walker at home.  He is able to stand and pivot but is very deconditioned overall.  I discussed that he would benefit from temporary rehab placement.  Patient is open to discussing further with case management but overall feels like he is ready to be discharged and says he cannot afford to go to the rehab.   02:34 PM  Patient has met with case manager and social work.  They have arranged for charity care and home health with physical therapy.  See the associated note for further detail.  [facility].  Medicines have been called in there which will be available to the patient free of charge.  He will begin taking those as directed and follow with home health and PCP as an outpatient.  He remains resistant to SNF but hopefully with these in place he can manage as nan outpatient. Discussed strict ED return precautions.   03:30 PM  Made aware by nursing that the patient is having asymptomatic hyperglycemia after drinking a coke and eating lunch. Ordered his home insulin for coverage. PTAR arrived at cannot transport with CBG > 250. Patient is adamant about leaving. Discussed wanting to get CBG down some prior to discharge. Patient is refusing and has been very agitated with staff. He is leaving AMA and has signed out. ____________________________________________  FINAL CLINICAL IMPRESSION(S) / ED DIAGNOSES  Final diagnoses:  Generalized weakness  Acute deep vein thrombosis (DVT) of proximal vein of right lower extremity ([place])     MEDICATIONS GIVEN DURING THIS VISIT:  Medications  Rivaroxaban (XARELTO) tablet 15  mg (15 mg Oral Given 10/02/20 1308)    Followed by  rivaroxaban (XARELTO) tablet 20 mg (has no administration in time range)  iohexol (OMNIPAQUE) 350 MG/ML injection 70 mL (70 mLs Intravenous Contrast Given 10/02/20 1103)  doxycycline (VIBRA-TABS) tablet 100 mg (100 mg Oral Given 10/02/20 1308)     NEW OUTPATIENT MEDICATIONS STARTED DURING THIS VISIT:  New Prescriptions   DOXYCYCLINE (VIBRAMYCIN) 100 MG CAPSULE    Take 1 capsule (100 mg total) by mouth 2 (two) times daily for 7 days.   RIVAROXABAN (XARELTO) VTE STARTER PACK (15 & 20 MG)    Follow package directions: Take one 59m tablet by mouth twice a day. On day 22, switch to one 236mtablet once a day. Take with food.    Note:  This document was prepared using Dragon voice recognition software and may include unintentional dictation  errors.  Nanda Quinton, MD, [name] Emergency Medicine    Matas Burrows, [employer], MD 10/02/20 1436    Kayelee Herbig, [employer] Old[employer], MD 10/02/20 1537

## 2020-10-03 LAB — URINE CULTURE

## 2020-10-04 ENCOUNTER — Other Ambulatory Visit (HOSPITAL_COMMUNITY): Payer: Self-pay | Admitting: Emergency Medicine

## 2020-10-04 ENCOUNTER — Other Ambulatory Visit: Payer: Self-pay

## 2020-10-04 ENCOUNTER — Emergency Department (HOSPITAL_COMMUNITY)
Admission: EM | Admit: 2020-10-04 | Discharge: 2020-10-04 | Disposition: A | Discharge status: Home / Self Care | Payer: Medicaid Other | Attending: Emergency Medicine | Admitting: Emergency Medicine

## 2020-10-04 DIAGNOSIS — Z86718 Personal history of other venous thrombosis and embolism: Secondary | ICD-10-CM

## 2020-10-04 DIAGNOSIS — E1042 Type 1 diabetes mellitus with diabetic polyneuropathy: Secondary | ICD-10-CM | POA: Diagnosis not present

## 2020-10-04 DIAGNOSIS — F17210 Nicotine dependence, cigarettes, uncomplicated: Secondary | ICD-10-CM | POA: Diagnosis not present

## 2020-10-04 DIAGNOSIS — E039 Hypothyroidism, unspecified: Secondary | ICD-10-CM | POA: Diagnosis not present

## 2020-10-04 DIAGNOSIS — M79604 Pain in right leg: Secondary | ICD-10-CM

## 2020-10-04 DIAGNOSIS — Z794 Long term (current) use of insulin: Secondary | ICD-10-CM | POA: Diagnosis not present

## 2020-10-04 DIAGNOSIS — Z79899 Other long term (current) drug therapy: Secondary | ICD-10-CM | POA: Insufficient documentation

## 2020-10-04 DIAGNOSIS — M7989 Other specified soft tissue disorders: Secondary | ICD-10-CM | POA: Insufficient documentation

## 2020-10-04 MED ORDER — RIVAROXABAN 15 MG PO TABS
15.0000 mg | ORAL_TABLET | Freq: Once | ORAL | Status: AC
  Administered 2020-10-04: 05:00:00 15 mg via ORAL
  Filled 2020-10-04: qty 1

## 2020-10-04 MED FILL — XARELTO STARTER PACK: 15 & 20 | 30 days supply | Qty: 51 | Fill #0

## 2020-10-04 MED FILL — DOXYCYCLINE HYCLATE 100 MG: 100 | 7 days supply | Qty: 14 | Fill #0

## 2020-10-04 NOTE — ED Triage Notes (Signed)
Patient came for for blood clot medication by EMS.  Hx: diabetes

## 2020-10-04 NOTE — ED Notes (Signed)
Patient received medication and nurse reviewed AVS and medication with patient. No concerns at this time.

## 2020-10-04 NOTE — ED Provider Notes (Signed)
[facility]-EMERGENCY DEPT Provider Note   CSN: [id number] Arrival date & time: 10/04/20  2725     History Chief Complaint  Patient presents with  . Coagulation Disorder    Two blood clots in his right leg.     Mark [name] is a 34 y.o. male.  Patient is a 34 year old male with past medical history of diabetes, bipolar disorder, anxiety, and recent admission for diabetic ketoacidosis.  Patient returned 2 days after discharge and was diagnosed with a DVT in his right leg.  He was prescribed Xarelto, however has been unable to fill this prescription secondary to finances.  He presents today with complaints of increased pain in his right leg that makes it difficult for him to ambulate.  He denies any chest pain or difficulty breathing.  He denies any fevers or chills.  From what I can tell in the chart, [facility], however the patient tells me [facility].  When he went to fill this medication, he was told it would cost "100s of dollars".  Patient could not afford this and did not fill the medication.  The history is provided by the patient.       Past Medical History:  Diagnosis Date  . Anxiety   . Bipolar disorder (HCC)   . Depression   . Diabetes mellitus without complication (HCC)   . Diabetic polyneuropathy associated with type 1 diabetes mellitus (HCC)   . Hydrocephalus (HCC)   . Kidney stones   . Seizures [facility])     Patient Active Problem List   Diagnosis Date Noted  . Fever   . Insomnia due to mental disorder   . Acute metabolic encephalopathy   . Bipolar I disorder (HCC)   . Uncontrolled type 1 diabetes mellitus with hyperglycemia (HCC)   . Physical deconditioning   . Ambulatory dysfunction   . AMS (altered mental status) 09/02/2020  . Seizures (HCC)   . Leukocytosis 06/22/2020  . Hyperkalemia 04/10/2020  . AKI (acute kidney injury) (HCC)  04/10/2020  . Left leg cellulitis 03/23/2020  . Bipolar I disorder, current or most recent episode depressed, in partial remission (HCC)   . MDD (major depressive disorder), recurrent episode, severe (HCC) 03/13/2020  . Drug overdose, intentional (HCC) 03/05/2020  . Drug overdose 03/05/2020  . Drug abuse (HCC) 03/05/2020  . Attempted suicide (HCC) 03/05/2020  . Insulin overdose   . Hypoglycemia 02/03/2020  . Hypothyroidism 02/02/2020  . Diabetic polyneuropathy associated with type 1 diabetes mellitus (HCC) 02/02/2020  . Type 1 diabetes mellitus with hyperosmolar hyperglycemic state (HHS) (HCC) 12/30/2019  . DKA (diabetic ketoacidosis) (HCC) 12/29/2019  . Homelessness 12/29/2019  . Marijuana abuse 12/29/2019  . Tobacco dependence 12/29/2019  . Seizure disorder (HCC) 12/29/2019  . Suicidal ideation   . Bipolar I disorder, most recent episode depressed, severe without psychotic features (HCC)   . Adjustment disorder with depressed mood 12/07/2017  . Noncompliance 10/17/2017  . Dysthymia 10/08/2017  . Personality disorder in adult [facility]) 09/26/2017  . 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 Obstructive hydrocephalus (HCC) 09/19/2017  . MDD (major depressive disorder) 08/22/2017  . Severe recurrent major depression with psychotic features (HCC) 08/19/2017  . Abdominal pain 08/18/2017  . Hyponatremia 08/18/2017  . DKA (diabetic ketoacidoses) 11/24/2015  . Diabetic keto-acidosis (HCC) 10/23/2015    Past Surgical History:  Procedure Laterality Date  . KIDNEY STONE SURGERY  Family History  Problem Relation Age of Onset  . Breast cancer Mother     Social History   Tobacco Use  . Smoking status: Current Every Day Smoker    Packs/day: 1.00    Years: 20.00    Pack years: 20.00    Types: Cigarettes  . Smokeless tobacco: Never Used  Vaping Use  . Vaping Use: Never used  Substance Use Topics  . Alcohol use: Yes    Comment: occassional   . Drug use: Yes    Frequency: 7.0 times  per week    Types: Marijuana    Comment: smoked 2-3 days ago, usually smokes daily    Home Medications Prior to Admission medications   Medication Sig Start Date End Date Taking? Authorizing Provider  acetaminophen (TYLENOL) 325 MG tablet Take 2 tablets (650 mg total) by mouth every 6 (six) hours as needed for mild pain, fever or headache. 09/30/20   Russella Dar, NP  atorvastatin (LIPITOR) 20 MG tablet Take 1 tablet (20 mg total) by mouth daily. 09/30/20   Russella Dar, NP  divalproex (DEPAKOTE) 250 MG DR tablet Take 3 tablets (750 mg total) by mouth every evening. 09/30/20   Russella Dar, NP  divalproex (DEPAKOTE) 500 MG DR tablet Take 1 tablet (500 mg total) by mouth daily. 10/01/20   Russella Dar, NP  doxycycline (VIBRAMYCIN) 100 MG capsule Take 1 capsule (100 mg total) by mouth 2 (two) times daily for 7 days. 10/02/20 10/09/20  Long, Arlyss Repress, MD  escitalopram (LEXAPRO) 20 MG tablet Take 1 tablet (20 mg total) by mouth daily. 10/01/20   Russella Dar, NP  gabapentin (NEURONTIN) 100 MG capsule Take 1 capsule (100 mg total) by mouth 2 (two) times daily. 09/30/20   Russella Dar, NP  insulin aspart protamine- aspart (NOVOLOG MIX 70/30) (70-30) 100 UNIT/ML injection Inject 0.35 mLs (35 Units total) into the skin 2 (two) times daily with a meal. 09/30/20   Russella Dar, NP  levothyroxine (SYNTHROID) 50 MCG tablet Take 1 tablet (50 mcg total) by mouth daily at 6 (six) AM. 10/01/20   Russella Dar, NP  pyridOXINE (B-6) 50 MG tablet Take 1 tablet (50 mg total) by mouth daily. 10/01/20   Russella Dar, NP  QUEtiapine (SEROQUEL) 100 MG tablet Take 1 tablet (100 mg total) by mouth every morning. 10/01/20   Russella Dar, NP  QUEtiapine (SEROQUEL) 400 MG tablet Take 1 tablet (400 mg total) by mouth at bedtime. 09/30/20   Russella Dar, NP  RIVAROXABAN Carlena Hurl) VTE STARTER PACK (15 & 20 MG) Follow package directions: Take one 15mg  tablet by mouth twice a day. On day  22, switch to one 20mg  tablet once a day. Take with food. 10/02/20   Long, , MD  insulin aspart (NOVOLOG) 100 UNIT/ML injection Inject 10 Units into the skin 3 (three) times daily with meals. Patient not taking: Reported on 03/23/2020 03/13/20 04/04/20  03/15/20, MD  pregabalin (LYRICA) 150 MG capsule Take 1 capsule (150 mg total) by mouth 2 (two) times daily. Patient not taking: Reported on 03/23/2020 12/31/19 04/04/20  03/01/20, MD    Allergies    Mushroom extract complex, Penicillins, Sulfa antibiotics, and Clindamycin/lincomycin  Review of Systems   Review of Systems  All other systems reviewed and are negative.   Physical Exam Updated Vital Signs BP 122/82 (BP Location: Right Arm)   Pulse 92   Temp 98.4 F (36.9 C) (  Oral)   Resp 16   SpO2 100%   Physical Exam Vitals and nursing note reviewed.  Constitutional:      General: He is not in acute distress.    Appearance: He is well-developed and well-nourished. He is not diaphoretic.  HENT:     Head: Normocephalic and atraumatic.     Mouth/Throat:     Mouth: Oropharynx is clear and moist.  Cardiovascular:     Rate and Rhythm: Normal rate and regular rhythm.     Heart sounds: No murmur heard. No friction rub.  Pulmonary:     Effort: Pulmonary effort is normal. No respiratory distress.     Breath sounds: Normal breath sounds. No wheezing or rales.  Abdominal:     General: Bowel sounds are normal. There is no distension.     Palpations: Abdomen is soft.     Tenderness: There is no abdominal tenderness.  Musculoskeletal:        General: Swelling present. No edema. Normal range of motion.     Cervical back: Normal range of motion and neck supple.     Comments: There is swelling and pain to the right lower leg, especially in the calf.  There is trace edema noted.  DP pulses are easily palpable.  Skin:    General: Skin is warm and dry.  Neurological:     Mental Status: He is alert and oriented to person, place,  and time.     Coordination: Coordination normal.     ED Results / Procedures / Treatments   Labs (all labs ordered are listed, but only abnormal results are displayed) Labs Reviewed - No data to display  EKG None  Radiology CT Angio Chest PE W and/or Wo Contrast  Result Date: 10/02/2020 CLINICAL DATA:  Near syncope. Concern for pulmonary embolism. Lower extremity DVT. EXAM: CT ANGIOGRAPHY CHEST WITH CONTRAST TECHNIQUE: Multidetector CT imaging of the chest was performed using the standard protocol during bolus administration of intravenous contrast. Multiplanar CT image reconstructions and MIPs were obtained to evaluate the vascular anatomy. CONTRAST:  70mL OMNIPAQUE IOHEXOL 350 MG/ML SOLN COMPARISON:  None. FINDINGS: Cardiovascular: No filling defects within the pulmonary arteries to suggest acute pulmonary embolism. No significant vascular findings. Normal heart size. No pericardial effusion. Mediastinum/Nodes: No axillary or supraclavicular adenopathy. No mediastinal adenopathy. Borderline enlarged hilar lymph nodes. No pericardial fluid. Esophagus normal. Lungs/Pleura: Fine nodular peri bronchovascular pattern in the LEFT lower lobe. Pattern involves the entirety of the LEFT lower lobe. No pleural fluid. No pneumothorax Upper Abdomen: Limited view of the liver, kidneys, pancreas are unremarkable. Normal adrenal glands. Musculoskeletal: No aggressive osseous lesion. Review of the MIP images confirms the above findings. IMPRESSION: 1. No evidence acute pulmonary embolism. 2. Fine peribronchovascular nodular pattern within the LEFT lower lobe suggest mild/early pulmonary infection. Electronically Signed   By: Genevive BiStewart  Edmunds M.D.   On: 10/02/2020 11:25   DG Chest Portable 1 View  Result Date: 10/02/2020 CLINICAL DATA:  Leukocytosis per order.leukocytosis EXAM: PORTABLE CHEST 1 VIEW COMPARISON:  None. FINDINGS: Normal mediastinum and cardiac silhouette. Normal pulmonary vasculature. No  evidence of effusion, infiltrate, or pneumothorax. No acute bony abnormality. IMPRESSION: No acute cardiopulmonary process. Electronically Signed   By: Genevive BiStewart  Edmunds M.D.   On: 10/02/2020 08:28   VAS US LOWER EXTREMITY VENOUS (DVT) (ONLY [facility] & [facility] 7a-7p)  Result Date: 10/03/2020  Lower Venous DVT Study Indications: Edema. Other Indications: Near syncope. Comparison Study: No prior RLE venous duplex on file Performing Technologist: Candace  Rosezetta Schlatter RVS  Examination Guidelines: A complete evaluation includes B-mode imaging, spectral Doppler, color Doppler, and power Doppler as needed of all accessible portions of each vessel. Bilateral testing is considered an integral part of a complete examination. Limited examinations for reoccurring indications may be performed as noted. The reflux portion of the exam is performed with the patient in reverse Trendelenburg.  +---------+---------------+---------+-----------+----------+--------------+ RIGHT    CompressibilityPhasicitySpontaneityPropertiesThrombus Aging +---------+---------------+---------+-----------+----------+--------------+ CFV      Full           Yes      Yes                                 +---------+---------------+---------+-----------+----------+--------------+ SFJ      Full                                                        +---------+---------------+---------+-----------+----------+--------------+ FV Prox  Full                                                        +---------+---------------+---------+-----------+----------+--------------+ FV Mid   Full                                                        +---------+---------------+---------+-----------+----------+--------------+ FV DistalNone                                         Acute          +---------+---------------+---------+-----------+----------+--------------+ PFV      Full                                                         +---------+---------------+---------+-----------+----------+--------------+ POP      None           No       No                   Acute          +---------+---------------+---------+-----------+----------+--------------+ PTV      None                                         Acute          +---------+---------------+---------+-----------+----------+--------------+ PERO     None                                         Acute          +---------+---------------+---------+-----------+----------+--------------+ Harlow Ohms  None                                         Acute          +---------+---------------+---------+-----------+----------+--------------+   +----+---------------+---------+-----------+----------+--------------+ LEFTCompressibilityPhasicitySpontaneityPropertiesThrombus Aging +----+---------------+---------+-----------+----------+--------------+ CFV Full           Yes      Yes                                 +----+---------------+---------+-----------+----------+--------------+     Summary: RIGHT: - Findings consistent with acute deep vein thrombosis involving the right popliteal vein, right posterior tibial veins, right peroneal veins, and right gastrocnemius veins. - Ultrasound characteristics of enlarged lymph nodes are noted in the groin.  LEFT: - No evidence of common femoral vein obstruction.  *See table(s) above for measurements and observations. Electronically signed by Fabienne Bruns MD on 10/03/2020 at 7:42:09 PM.    Final     Procedures Procedures (including critical care time)  Medications Ordered in ED Medications  Rivaroxaban (XARELTO) tablet 15 mg (has no administration in time range)    ED Course  I have reviewed the triage vital signs and the nursing notes.  Pertinent labs & imaging results that were available during my care of the patient were reviewed by me and considered in my medical decision making (see chart for details).    MDM  Rules/Calculators/A&P  Patient has been given a dose of Xarelto here in the ER.  [facility] [address] to inquire about the patient's Xarelto prescription, however the pharmacist told me that this prescription had been transferred to another pharmacy and had no further information.  The patient is uncertain as to which pharmacy the prescription went to, but when his friend attempted to pick it up, he was told to cost 100s of dollars and patient could not afford this.  I have reached out to case management and social services who can hopefully touch base with him this morning to discuss his prescriptions.  At this point I see no indication for further imaging or work-up.  He will be discharged with case management follow-up to hopefully get him this prescription.  Final Clinical Impression(s) / ED Diagnoses Final diagnoses:  None    Rx / DC Orders ED Discharge Orders    None       Geoffery Lyons, MD 10/04/20 [phone]

## 2020-10-04 NOTE — Discharge Instructions (Addendum)
Case management/social services should reach out to you this morning to make arrangements for you to get your Xarelto prescription filled.  Follow-up with primary doctor in the next few days if symptoms or not improving.

## 2020-10-04 NOTE — Progress Notes (Addendum)
TOC CM received call from pt. Stating he cannot pick up meds. And does not have transportation to his appt on 10/20/2020. Contacted [facility] and they do have Xarelto starter. Called rx into pharmacy and faxed 30 day free trial and Match letter to pharmacy to fill. Attempted call to pt to discuss him picking up meds. Left message for pt for return call. Isidoro Donning RN CCM, [facility] ED TOC CM [phone]   430 pm TOC CM picked up meds from [facility]. Pt states he did not have any money to catch the bus and had falls at home. His roommate is vision impaired and does not drive at night. To reduce the chance of readmission, [employer] called to escort CM to pt home to deliver meds. Isidoro Donning RN CCM, [facility] ED TOC CM [phone]

## 2020-10-06 LAB — GLUCOSE, CAPILLARY: Glucose-Capillary: 43 mg/dL — CL (ref 70–99)

## 2020-10-20 ENCOUNTER — Inpatient Hospital Stay (INDEPENDENT_AMBULATORY_CARE_PROVIDER_SITE_OTHER): Payer: Self-pay | Admitting: Primary Care

## 2020-11-03 ENCOUNTER — Observation Stay (HOSPITAL_COMMUNITY)
Admission: EM | Admit: 2020-11-03 | Discharge: 2020-11-03 | Disposition: A | Discharge status: Home / Self Care | Payer: 59 | Attending: Family Medicine | Admitting: Family Medicine

## 2020-11-03 ENCOUNTER — Other Ambulatory Visit: Payer: Self-pay

## 2020-11-03 ENCOUNTER — Encounter (HOSPITAL_COMMUNITY): Payer: Self-pay

## 2020-11-03 ENCOUNTER — Emergency Department (HOSPITAL_COMMUNITY)
Admission: EM | Admit: 2020-11-03 | Discharge: 2020-11-04 | Disposition: A | Discharge status: Home / Self Care | Payer: 59 | Source: Home / Self Care

## 2020-11-03 DIAGNOSIS — Z79899 Other long term (current) drug therapy: Secondary | ICD-10-CM | POA: Insufficient documentation

## 2020-11-03 DIAGNOSIS — Z20822 Contact with and (suspected) exposure to covid-19: Secondary | ICD-10-CM | POA: Insufficient documentation

## 2020-11-03 DIAGNOSIS — E038 Other specified hypothyroidism: Secondary | ICD-10-CM

## 2020-11-03 DIAGNOSIS — E1065 Type 1 diabetes mellitus with hyperglycemia: Secondary | ICD-10-CM | POA: Diagnosis not present

## 2020-11-03 DIAGNOSIS — I824Z1 Acute embolism and thrombosis of unspecified deep veins of right distal lower extremity: Secondary | ICD-10-CM

## 2020-11-03 DIAGNOSIS — E875 Hyperkalemia: Secondary | ICD-10-CM | POA: Diagnosis not present

## 2020-11-03 DIAGNOSIS — F17210 Nicotine dependence, cigarettes, uncomplicated: Secondary | ICD-10-CM | POA: Diagnosis not present

## 2020-11-03 DIAGNOSIS — Z794 Long term (current) use of insulin: Secondary | ICD-10-CM | POA: Insufficient documentation

## 2020-11-03 DIAGNOSIS — Z5321 Procedure and treatment not carried out due to patient leaving prior to being seen by health care provider: Secondary | ICD-10-CM | POA: Insufficient documentation

## 2020-11-03 DIAGNOSIS — Z7901 Long term (current) use of anticoagulants: Secondary | ICD-10-CM | POA: Insufficient documentation

## 2020-11-03 DIAGNOSIS — R739 Hyperglycemia, unspecified: Secondary | ICD-10-CM | POA: Insufficient documentation

## 2020-11-03 DIAGNOSIS — E039 Hypothyroidism, unspecified: Secondary | ICD-10-CM | POA: Diagnosis not present

## 2020-11-03 DIAGNOSIS — I82401 Acute embolism and thrombosis of unspecified deep veins of right lower extremity: Secondary | ICD-10-CM | POA: Diagnosis not present

## 2020-11-03 DIAGNOSIS — F319 Bipolar disorder, unspecified: Secondary | ICD-10-CM | POA: Diagnosis not present

## 2020-11-03 DIAGNOSIS — E1165 Type 2 diabetes mellitus with hyperglycemia: Secondary | ICD-10-CM | POA: Diagnosis present

## 2020-11-03 LAB — URINALYSIS, ROUTINE W REFLEX MICROSCOPIC
Bacteria, UA: NONE SEEN
Bilirubin Urine: NEGATIVE
Glucose, UA: >=500 mg/dL — AB
Hgb urine dipstick: NEGATIVE
Ketones, ur: 20 mg/dL — AB
Leukocytes,Ua: NEGATIVE
Nitrite: NEGATIVE
Protein, ur: NEGATIVE mg/dL
Specific Gravity, Urine: 1.016 (ref 1.005–1.030)
pH: 6 (ref 5.0–8.0)

## 2020-11-03 LAB — CBG MONITORING, ED
Glucose-Capillary: >600 mg/dL (ref 70–99)
Glucose-Capillary: 473 mg/dL — ABNORMAL HIGH (ref 70–99)
Glucose-Capillary: 434 mg/dL — ABNORMAL HIGH (ref 70–99)

## 2020-11-03 LAB — BASIC METABOLIC PANEL
Anion gap: 12 (ref 5–15)
BUN: 12 mg/dL (ref 6–20)
CO2: 21 mmol/L — ABNORMAL LOW (ref 22–32)
Calcium: 8.9 mg/dL (ref 8.9–10.3)
Chloride: 96 mmol/L — ABNORMAL LOW (ref 98–111)
Creatinine, Ser: 1.07 mg/dL (ref 0.61–1.24)
GFR, Estimated: >60 mL/min (ref >60)
Glucose, Bld: 774 mg/dL (ref 70–99)
Potassium: 5.6 mmol/L — ABNORMAL HIGH (ref 3.5–5.1)
Sodium: 129 mmol/L — ABNORMAL LOW (ref 135–145)

## 2020-11-03 LAB — CBC
HCT: 42.1 % (ref 39.0–52.0)
Hemoglobin: 13.6 g/dL (ref 13.0–17.0)
MCH: 30.1 pg (ref 26.0–34.0)
MCHC: 32.3 g/dL (ref 30.0–36.0)
MCV: 93.1 fL (ref 80.0–100.0)
Platelets: 267 10*3/uL (ref 150–400)
RBC: 4.52 MIL/uL (ref 4.22–5.81)
RDW: 13.3 % (ref 11.5–15.5)
WBC: 10.4 10*3/uL (ref 4.0–10.5)
nRBC: 0 % (ref 0.0–0.2)

## 2020-11-03 LAB — RESP PANEL BY RT-PCR (FLU A&B, COVID) ARPGX2
Influenza A by PCR: NEGATIVE
Influenza B by PCR: NEGATIVE
SARS Coronavirus 2 by RT PCR: NEGATIVE

## 2020-11-03 LAB — BETA-HYDROXYBUTYRIC ACID: Beta-Hydroxybutyric Acid: 3.62 mmol/L — ABNORMAL HIGH (ref 0.05–0.27)

## 2020-11-03 MED ORDER — INSULIN ASPART 100 UNIT/ML ~~LOC~~ SOLN
8.0000 [IU] | Freq: Once | SUBCUTANEOUS | Status: AC
  Administered 2020-11-03: 8 [IU] via INTRAVENOUS
  Filled 2020-11-03: qty 0.08

## 2020-11-03 MED ORDER — RIVAROXABAN 20 MG PO TABS
20.0000 mg | ORAL_TABLET | Freq: Every day | ORAL | Status: DC
  Filled 2020-11-03: qty 1

## 2020-11-03 MED ORDER — SODIUM CHLORIDE 0.9 % IV BOLUS
1000.0000 mL | Freq: Once | INTRAVENOUS | Status: AC
  Administered 2020-11-03: 1000 mL via INTRAVENOUS

## 2020-11-03 MED ORDER — LEVOTHYROXINE SODIUM 50 MCG PO TABS: 50.0000 ug | ORAL_TABLET | Freq: Every day | ORAL | Status: DC

## 2020-11-03 MED ORDER — VITAMIN B-6 50 MG PO TABS: 50.0000 mg | ORAL_TABLET | Freq: Every day | ORAL | Status: DC

## 2020-11-03 MED ORDER — GABAPENTIN 100 MG PO CAPS: 100.0000 mg | ORAL_CAPSULE | Freq: Two times a day (BID) | ORAL | Status: DC

## 2020-11-03 MED ORDER — QUETIAPINE FUMARATE 300 MG PO TABS: 400.0000 mg | ORAL_TABLET | Freq: Every day | ORAL | Status: DC

## 2020-11-03 MED ORDER — DEXTROSE 50 % IV SOLN: 0.0000 mL | INTRAVENOUS | Status: DC | PRN

## 2020-11-03 MED ORDER — QUETIAPINE FUMARATE 100 MG PO TABS: 100.0000 mg | ORAL_TABLET | Freq: Every morning | ORAL | Status: DC

## 2020-11-03 MED ORDER — DIVALPROEX SODIUM 500 MG PO DR TAB: 750.0000 mg | DELAYED_RELEASE_TABLET | Freq: Every evening | ORAL | Status: DC

## 2020-11-03 MED ORDER — INSULIN REGULAR(HUMAN) IN NACL 100-0.9 UT/100ML-% IV SOLN
INTRAVENOUS | Status: DC
  Administered 2020-11-03: 8.5 [IU]/h via INTRAVENOUS
  Filled 2020-11-03: qty 100

## 2020-11-03 MED ORDER — DEXTROSE IN LACTATED RINGERS 5 % IV SOLN: INTRAVENOUS | Status: DC

## 2020-11-03 MED ORDER — NICOTINE 21 MG/24HR TD PT24: 21.0000 mg | MEDICATED_PATCH | Freq: Every day | TRANSDERMAL | Status: DC

## 2020-11-03 MED ORDER — DIVALPROEX SODIUM 500 MG PO DR TAB: 500.0000 mg | DELAYED_RELEASE_TABLET | Freq: Every day | ORAL | Status: DC

## 2020-11-03 MED ORDER — LACTATED RINGERS IV SOLN: INTRAVENOUS | Status: DC

## 2020-11-03 MED ORDER — RIVAROXABAN 15 MG PO TABS: 15.0000 mg | ORAL_TABLET | Freq: Two times a day (BID) | ORAL | Status: DC

## 2020-11-03 NOTE — Discharge Summary (Signed)
Notified that patient left AMA. He apparently became agitated and aggressive with staff and elected to leave very shortly after admission and while still in ED. Please refer to H&P and nursing documentation for details.

## 2020-11-03 NOTE — ED Provider Notes (Signed)
[facility]-EMERGENCY DEPT Provider Note   CSN: [id number] Arrival date & time: 11/03/20  1651     History Chief Complaint  Patient presents with  . Leg Pain  . Hyperglycemia    Mark [name] is a 35 y.o. male.  35 year old male with prior medical history as detailed below presents for evaluation of vomiting in the setting of hyperglycemia.  Patient reports that he has run out of money for his insulin.  He last used insulin approximately 3 days prior.  Patient reports prior history of DKA.  Patient reports multiple episodes of vomiting over the course the last 24 hours.  He denies fever.  He denies chest pain or abdominal pain.  Patient reports compliance with other medications.  He reports that he was hoping to get a " handout of insulin."   The history is provided by the patient and medical records.  Hyperglycemia Severity:  Severe Onset quality:  Gradual Duration:  3 days Timing:  Constant Progression:  Worsening Chronicity:  Recurrent Diabetes status:  Controlled with insulin Context comment:  Ran out of insulin  Ineffective treatments:  None tried      Past Medical History:  Diagnosis Date  . Anxiety   . Bipolar disorder (HCC)   . Depression   . Diabetes mellitus without complication (HCC)   . Diabetic polyneuropathy associated with type 1 diabetes mellitus (HCC)   . Hydrocephalus (HCC)   . Kidney stones   . Seizures [facility]HCC)     Patient Active Problem List   Diagnosis Date Noted  . Uncontrolled diabetes mellitus with hyperglycemia (HCC) 11/03/2020  . Fever   . Insomnia due to mental disorder   . Acute metabolic encephalopathy   . Bipolar I disorder (HCC)   . Uncontrolled type 1 diabetes mellitus with hyperglycemia (HCC)   . Physical deconditioning   . Ambulatory dysfunction   . AMS (altered mental status) 09/02/2020  . Seizures (HCC)   . Leukocytosis 06/22/2020  . Hyperkalemia 04/10/2020  . AKI (acute kidney injury) (HCC)  04/10/2020  . Left leg cellulitis 03/23/2020  . Bipolar I disorder, current or most recent episode depressed, in partial remission (HCC)   . MDD (major depressive disorder), recurrent episode, severe (HCC) 03/13/2020  . Drug overdose, intentional (HCC) 03/05/2020  . Drug overdose 03/05/2020  . Drug abuse (HCC) 03/05/2020  . Attempted suicide (HCC) 03/05/2020  . Insulin overdose   . Hypoglycemia 02/03/2020  . Hypothyroidism 02/02/2020  . Diabetic polyneuropathy associated with type 1 diabetes mellitus (HCC) 02/02/2020  . Type 1 diabetes mellitus with hyperosmolar hyperglycemic state (HHS) (HCC) 12/30/2019  . DKA (diabetic ketoacidosis) (HCC) 12/29/2019  . Homelessness 12/29/2019  . Marijuana abuse 12/29/2019  . Tobacco dependence 12/29/2019  . Seizure disorder (HCC) 12/29/2019  . Suicidal ideation   . Bipolar I disorder, most recent episode depressed, severe without psychotic features (HCC)   . Adjustment disorder with depressed mood 12/07/2017  . Noncompliance 10/17/2017  . Dysthymia 10/08/2017  . Personality disorder in adult [facility](HCC) 09/26/2017  . 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 Obstructive hydrocephalus (HCC) 09/19/2017  . MDD (major depressive disorder) 08/22/2017  . Severe recurrent major depression with psychotic features (HCC) 08/19/2017  . Abdominal pain 08/18/2017  . Hyponatremia 08/18/2017  . DKA (diabetic ketoacidoses) 11/24/2015  . Diabetic keto-acidosis (HCC) 10/23/2015    Past Surgical History:  Procedure Laterality Date  . KIDNEY STONE SURGERY         Family History  Problem Relation Age of Onset  .  Breast cancer Mother     Social History   Tobacco Use  . Smoking status: Current Every Day Smoker    Packs/day: 1.00    Years: 20.00    Pack years: 20.00    Types: Cigarettes  . Smokeless tobacco: Never Used  Vaping Use  . Vaping Use: Never used  Substance Use Topics  . Alcohol use: Yes    Comment: occassional   . Drug use: Yes    Frequency: 7.0 times  per week    Types: Marijuana    Comment: smoked 2-3 days ago, usually smokes daily    Home Medications Prior to Admission medications   Medication Sig Start Date End Date Taking? Authorizing Provider  acetaminophen (TYLENOL) 325 MG tablet Take 2 tablets (650 mg total) by mouth every 6 (six) hours as needed for mild pain, fever or headache. 09/30/20   Russella Dar, NP  atorvastatin (LIPITOR) 20 MG tablet Take 1 tablet (20 mg total) by mouth daily. 09/30/20   Russella Dar, NP  divalproex (DEPAKOTE) 250 MG DR tablet Take 3 tablets (750 mg total) by mouth every evening. 09/30/20   Russella Dar, NP  divalproex (DEPAKOTE) 500 MG DR tablet Take 1 tablet (500 mg total) by mouth daily. 10/01/20   Russella Dar, NP  escitalopram (LEXAPRO) 20 MG tablet Take 1 tablet (20 mg total) by mouth daily. 10/01/20   Russella Dar, NP  gabapentin (NEURONTIN) 100 MG capsule Take 1 capsule (100 mg total) by mouth 2 (two) times daily. 09/30/20   Russella Dar, NP  insulin aspart protamine- aspart (NOVOLOG MIX 70/30) (70-30) 100 UNIT/ML injection Inject 0.35 mLs (35 Units total) into the skin 2 (two) times daily with a meal. 09/30/20   Russella Dar, NP  levothyroxine (SYNTHROID) 50 MCG tablet Take 1 tablet (50 mcg total) by mouth daily at 6 (six) AM. 10/01/20   Russella Dar, NP  pyridOXINE (B-6) 50 MG tablet Take 1 tablet (50 mg total) by mouth daily. 10/01/20   Russella Dar, NP  QUEtiapine (SEROQUEL) 100 MG tablet Take 1 tablet (100 mg total) by mouth every morning. 10/01/20   Russella Dar, NP  QUEtiapine (SEROQUEL) 400 MG tablet Take 1 tablet (400 mg total) by mouth at bedtime. 09/30/20   Russella Dar, NP  RIVAROXABAN Carlena Hurl) VTE STARTER PACK (15 & 20 MG) Follow package directions: Take one 15mg  tablet by mouth twice a day. On day 22, switch to one 20mg  tablet once a day. Take with food. 10/02/20   Long, , MD  insulin aspart (NOVOLOG) 100 UNIT/ML injection Inject 10  Units into the skin 3 (three) times daily with meals. Patient not taking: Reported on 03/23/2020 03/13/20 04/04/20  03/15/20, MD  pregabalin (LYRICA) 150 MG capsule Take 1 capsule (150 mg total) by mouth 2 (two) times daily. Patient not taking: Reported on 03/23/2020 12/31/19 04/04/20  03/01/20, MD    Allergies    Mushroom extract complex, Penicillins, Sulfa antibiotics, and Clindamycin/lincomycin  Review of Systems   Review of Systems  All other systems reviewed and are negative.   Physical Exam Updated Vital Signs BP 116/67 (BP Location: Right Arm)   Pulse 86   Temp 98 F (36.7 C) (Oral)   Resp 19   Ht 6\' 2"  (1.88 m)   Wt 113.4 kg   SpO2 98%   BMI 32.10 kg/m   Physical Exam Vitals and nursing note reviewed.  Constitutional:  General: He is not in acute distress.    Appearance: Normal appearance. He is well-developed and well-nourished.  HENT:     Head: Normocephalic and atraumatic.     Mouth/Throat:     Mouth: Oropharynx is clear and moist.  Eyes:     Extraocular Movements: EOM normal.     Conjunctiva/sclera: Conjunctivae normal.     Pupils: Pupils are equal, round, and reactive to light.  Cardiovascular:     Rate and Rhythm: Normal rate and regular rhythm.     Heart sounds: Normal heart sounds.  Pulmonary:     Effort: Pulmonary effort is normal. No respiratory distress.     Breath sounds: Normal breath sounds.  Abdominal:     General: There is no distension.     Palpations: Abdomen is soft.     Tenderness: There is no abdominal tenderness.  Musculoskeletal:        General: No deformity or edema. Normal range of motion.     Cervical back: Normal range of motion and neck supple.  Skin:    General: Skin is warm and dry.  Neurological:     Mental Status: He is alert and oriented to person, place, and time.  Psychiatric:        Mood and Affect: Mood and affect normal.     ED Results / Procedures / Treatments   Labs (all labs ordered are listed,  but only abnormal results are displayed) Labs Reviewed  BASIC METABOLIC PANEL - Abnormal; Notable for the following components:      Result Value   Sodium 129 (*)    Potassium 5.6 (*)    Chloride 96 (*)    CO2 21 (*)    Glucose, Bld 774 (*)    All other components within normal limits  CBG MONITORING, ED - Abnormal; Notable for the following components:   Glucose-Capillary >600 (*)    All other components within normal limits  RESP PANEL BY RT-PCR (FLU A&B, COVID) ARPGX2  CBC  URINALYSIS, ROUTINE W REFLEX MICROSCOPIC  CBG MONITORING, ED    EKG EKG Interpretation  Date/Time:  Thursday November 03 2020 19:11:47 EST Ventricular Rate:  91 PR Interval:    QRS Duration: 84 QT Interval:  366 QTC Calculation: 451 R Axis:   38 Text Interpretation: Sinus rhythm ST elev, probable normal early repol pattern Confirmed by Kristine Royal [phone]) on 11/03/2020 7:15:47 PM   Radiology No results found.  Procedures Procedures (including critical care time)  Medications Ordered in ED Medications  sodium chloride 0.9 % bolus 1,000 mL (has no administration in time range)  insulin aspart (novoLOG) injection 8 Units (has no administration in time range)    ED Course  I have reviewed the triage vital signs and the nursing notes.  Pertinent labs & imaging results that were available during my care of the patient were reviewed by me and considered in my medical decision making (see chart for details).    MDM Rules/Calculators/A&P                          MDM  Screen complete  Mark [name] was evaluated in Emergency Department on 11/03/2020 for the symptoms described in the history of present illness. He was evaluated in the context of the global COVID-19 pandemic, which necessitated consideration that the patient might be at risk for infection with the SARS-CoV-2 virus that causes COVID-19. Institutional protocols and algorithms that pertain to the evaluation  of patients at risk for  COVID-19 are in a state of rapid change based on information released by regulatory bodies including the CDC and federal and state organizations. These policies and algorithms were followed during the patient's care in the ED.  Patient is presenting with complaint of vomiting in the setting of hyperglycemia  Patient is apparently unable to afford his insulin.  He has not used any insulin in the last 3 to 4 days.  Patient's labs are monitored above elevated glucose.  His gap is normal at 12.  Patient with prior history of DKA.  Patient would likely benefit from overnight admission for IV fluids and correction of his hyperglycemia.  Hospitalist service ([facility]) is aware of case and will evaluate for admission.    Final Clinical Impression(s) / ED Diagnoses Final diagnoses:  Hyperglycemia    Rx / DC Orders ED Discharge Orders    None       Wynetta Fines, MD 11/03/20 1929

## 2020-11-03 NOTE — ED Notes (Signed)
Pt ripped out IV's and is wanting to leave AMA. He has been rude and belligerent screaming profanities at every staff member that has entered the room. Pt called this RN a "fucking bitch" when this RN explained to him that he could not speak to staff the way he is doing and be disrespectful. This RN explained to pt that water is the only thing that he is able to have at this time.

## 2020-11-03 NOTE — H&P (Signed)
History and Physical    Mark [name] PXT:[id number] DOB: April 30, 1986 DOA: 11/03/2020  PCP: Patient, No Pcp Per   Patient coming from: Home   Chief Complaint: N/V, right leg pain   HPI: Mark [name] is a 34 y.o. male with medical history significant for bipolar disorder, insulin-dependent diabetes mellitus, right leg DVT diagnosed 1 month ago, and poor adherence with his medications, now presenting to the emergency department for evaluation of nausea, vomiting, and right leg pain after going a few days without insulin.  Patient reports that he did not understand how to take his Xarelto initially, missed a few doses, but has been compliant with that in the last couple weeks but has run out of insulin and was unable to obtain anymore due to financial constraints.  Over the past couple days he has developed nausea with nonbloody vomiting.  He denies any abdominal pain, fevers, or diarrhea.  He denies shortness of breath.  He reports chronic cough that he attributes to smoking but denies any recent worsening in this.   ED Course: Upon arrival to the ED, patient is found to be afebrile, saturating well on room air, slightly tachycardic, and with stable blood pressure.  EKG features sinus rhythm.  Chemistry panel notable for glucose of 774 and potassium 5.6.  CBC is unremarkable.  COVID-19 screening test is pending.  Patient was given a liter of saline and 8 units IV NovoLog in the ED.  Review of Systems:  All other systems reviewed and apart from HPI, are negative.  Past Medical History:  Diagnosis Date  . Anxiety   . Bipolar disorder (HCC)   . Depression   . Diabetes mellitus without complication (HCC)   . Diabetic polyneuropathy associated with type 1 diabetes mellitus (HCC)   . Hydrocephalus (HCC)   . Kidney stones   . Seizures (HCC)     Past Surgical History:  Procedure Laterality Date  . KIDNEY STONE SURGERY      Social History:   reports that he has been smoking cigarettes.  He has a 20.00 pack-year smoking history. He has never used smokeless tobacco. He reports current alcohol use. He reports current drug use. Frequency: 7.00 times per week. Drug: Marijuana.  Allergies  Allergen Reactions  . Mushroom Extract Complex Hives, Itching and Nausea And Vomiting  . Penicillins Anaphylaxis, Hives and Swelling    Has patient had a PCN reaction causing immediate rash, facial/tongue/throat swelling, SOB or lightheadedness with hypotension: Yes Has patient had a PCN reaction causing severe rash involving mucus membranes or skin necrosis: No Has patient had a PCN reaction that required hospitalization: Yes Has patient had a PCN reaction occurring within the last 10 years: Yes If all of the above answers are "NO", then may proceed with Cephalosporin use. Tolerated ceftriaxone 03/24/20  . Sulfa Antibiotics Anaphylaxis and Hives  . Clindamycin/Lincomycin Hives    Family History  Problem Relation Age of Onset  . Breast cancer Mother      Prior to Admission medications   Medication Sig Start Date End Date Taking? Authorizing Provider  divalproex (DEPAKOTE) 250 MG DR tablet Take 3 tablets (750 mg total) by mouth every evening. 09/30/20  Yes Russella Dar, NP  divalproex (DEPAKOTE) 500 MG DR tablet Take 1 tablet (500 mg total) by mouth daily. 10/01/20  Yes Russella Dar, NP  gabapentin (NEURONTIN) 100 MG capsule Take 1 capsule (100 mg total) by mouth 2 (two) times daily. 09/30/20  Yes Russella Dar, NP  insulin aspart protamine- aspart (NOVOLOG MIX 70/30) (70-30) 100 UNIT/ML injection Inject 0.35 mLs (35 Units total) into the skin 2 (two) times daily with a meal. 09/30/20  Yes Russella Dar, NP  levothyroxine (SYNTHROID) 50 MCG tablet Take 1 tablet (50 mcg total) by mouth daily at 6 (six) AM. 10/01/20  Yes Russella Dar, NP  pyridOXINE (B-6) 50 MG tablet Take 1 tablet (50 mg total) by mouth daily. 10/01/20  Yes Russella Dar, NP  QUEtiapine (SEROQUEL) 100 MG  tablet Take 1 tablet (100 mg total) by mouth every morning. 10/01/20  Yes Russella Dar, NP  QUEtiapine (SEROQUEL) 400 MG tablet Take 1 tablet (400 mg total) by mouth at bedtime. 09/30/20  Yes Russella Dar, NP  RIVAROXABAN Carlena Hurl) VTE STARTER PACK (15 & 20 MG) Follow package directions: Take one 15mg  tablet by mouth twice a day. On day 22, switch to one 20mg  tablet once a day. Take with food. Patient taking differently: Take 15-20 mg by mouth as directed. Follow package directions: Take one 15mg  tablet by mouth twice a day. On day 22, switch to one 20mg  tablet once a day. Take with food. 10/02/20  Yes Long, , MD  acetaminophen (TYLENOL) 325 MG tablet Take 2 tablets (650 mg total) by mouth every 6 (six) hours as needed for mild pain, fever or headache. 09/30/20   , NP  atorvastatin (LIPITOR) 20 MG tablet Take 1 tablet (20 mg total) by mouth daily. 09/30/20   Arlyss Repress, NP  escitalopram (LEXAPRO) 20 MG tablet Take 1 tablet (20 mg total) by mouth daily. Patient not taking: Reported on 11/03/2020 10/01/20   14/10/21, NP  insulin aspart (NOVOLOG) 100 UNIT/ML injection Inject 10 Units into the skin 3 (three) times daily with meals. Patient not taking: Reported on 03/23/2020 03/13/20 04/04/20  Russella Dar, MD  pregabalin (LYRICA) 150 MG capsule Take 1 capsule (150 mg total) by mouth 2 (two) times daily. Patient not taking: Reported on 03/23/2020 12/31/19 04/04/20  Lanae Boast, MD    Physical Exam: Vitals:   11/03/20 1705 11/03/20 1828 11/03/20 1906 11/03/20 2025  BP: (!) 111/59 109/73 116/67 (!) 105/93  Pulse: (!) 103 83 86 99  Resp: 16 18 19 18   Temp: 98 F (36.7 C)     TempSrc: Oral     SpO2: 100% 99% 98% 98%  Weight:      Height:        Constitutional: NAD, calm  Eyes: PERTLA, lids and conjunctivae normal ENMT: Mucous membranes are moist. Posterior pharynx clear of any exudate or lesions.   Neck: normal, supple, no masses, no  thyromegaly Respiratory:  no wheezing, no crackles. No accessory muscle use.  Cardiovascular: S1 & S2 heard, regular rate and rhythm. No JVD.   Abdomen: No distension, no tenderness, soft. Bowel sounds active.  Musculoskeletal: no clubbing / cyanosis. No joint deformity upper and lower extremities.   Skin: no significant rashes, lesions, ulcers. Warm, dry, well-perfused. Neurologic: CN 2-12 grossly intact. Sensation intact. Moving all extremities.  Psychiatric: Alert and oriented to person, place, and situation. Pleasant and cooperative.    Labs and Imaging on Admission: I have personally reviewed following labs and imaging studies  CBC: Recent Labs  Lab 11/03/20 1730  WBC 10.4  HGB 13.6  HCT 42.1  MCV 93.1  PLT 267   Basic Metabolic Panel: Recent Labs  Lab 11/03/20 1730  NA 129*  K 5.6*  CL 96*  CO2 21*  GLUCOSE 774*  BUN 12  CREATININE 1.07  CALCIUM 8.9   GFR: Estimated Creatinine Clearance: 127.8 mL/min (by C-G formula based on SCr of 1.07 mg/dL). Liver Function Tests: No results for input(s): AST, ALT, ALKPHOS, BILITOT, PROT, ALBUMIN in the last 168 hours. No results for input(s): LIPASE, AMYLASE in the last 168 hours. No results for input(s): AMMONIA in the last 168 hours. Coagulation Profile: No results for input(s): INR, PROTIME in the last 168 hours. Cardiac Enzymes: No results for input(s): CKTOTAL, CKMB, CKMBINDEX, TROPONINI in the last 168 hours. BNP (last 3 results) No results for input(s): PROBNP in the last 8760 hours. HbA1C: No results for input(s): HGBA1C in the last 72 hours. CBG: Recent Labs  Lab 11/03/20 1859  GLUCAP >600*   Lipid Profile: No results for input(s): CHOL, HDL, LDLCALC, TRIG, CHOLHDL, LDLDIRECT in the last 72 hours. Thyroid Function Tests: No results for input(s): TSH, T4TOTAL, FREET4, T3FREE, THYROIDAB in the last 72 hours. Anemia Panel: No results for input(s): VITAMINB12, FOLATE, FERRITIN, TIBC, IRON, RETICCTPCT in the  last 72 hours. Urine analysis:    Component Value Date/Time   COLORURINE COLORLESS (A) 11/03/2020 1914   APPEARANCEUR CLEAR 11/03/2020 1914   APPEARANCEUR Clear 01/20/2015 2136   LABSPEC 1.016 11/03/2020 1914   LABSPEC 1.023 01/20/2015 2136   PHURINE 6.0 11/03/2020 1914   GLUCOSEU >=500 (A) 11/03/2020 1914   GLUCOSEU >=500 01/20/2015 2136   HGBUR NEGATIVE 11/03/2020 1914   BILIRUBINUR NEGATIVE 11/03/2020 1914   BILIRUBINUR Negative 01/20/2015 2136   KETONESUR 20 (A) 11/03/2020 1914   PROTEINUR NEGATIVE 11/03/2020 1914   NITRITE NEGATIVE 11/03/2020 1914   LEUKOCYTESUR NEGATIVE 11/03/2020 1914   LEUKOCYTESUR Negative 01/20/2015 2136   Sepsis Labs: @LABRCNTIP (procalcitonin:4,lacticidven:4) )No results found for this or any previous visit (from the past 240 hour(s)).   Radiological Exams on Admission: No results found.  EKG: Independently reviewed. Sinus rhythm.   Assessment/Plan   1. Uncontrolled IDDM with hyperglycemia  - Presents with N/V after going a few days without insulin and is found to have serum glucose 774 with serum bicarb 21, AG 12  - Given a liter of saline and 8 units IV Novolog in ED  - Check UA and BHOB, continue IVF hydration, start insulin infusion    2. Right leg DVT  - RLE DVT diagnosed 1 month ago and Xarelto started  - Continue Xarelto   3. Hyperkalemia  - Serum potassium is 5.6 on admission  - Secondary to insulin deficiency, anticipate resolution with IVF and insulin  - Continue cardiac monitoring, repeat chem panel overnight   4. Bipolar disorder  - Appears stable, continue Seroquel and Depakote    5. Hypothyroidism  - Continue Synthroid    6. Tobacco abuse  - Patient reports smoking 1.5 ppd, would like to cut back and/or quit but does feel ready to start that yet  - Nicotine patch ordered, continue counseling    DVT prophylaxis: Xarelto  Code Status: Full  Family Communication: Discussed with patient  Disposition Plan:  Patient is  from: Home  Anticipated d/c is to: Home  Anticipated d/c date is: 11/04/20 Patient currently: pending glycemic-control  Consults called: none  Admission status: Observation     11/06/20, MD [facility]  11/03/2020, 8:34 PM

## 2020-11-03 NOTE — ED Triage Notes (Signed)
Patient c/o bilateral leg pain x 1 week. Patient also reports that he had a blood clot in the right leg.  Patient also reports that he has not had insulin in several days and has been vomiting. CBG-365 per EMS.

## 2020-11-03 NOTE — ED Notes (Signed)
Patient has continued to call out excessively for a sandwich and soda. Patient stated to NT that if he didn't get a sandwich and drink he was going to pulled all this shit out. Patient given a sandwich and diet sandwich

## 2020-11-03 NOTE — ED Triage Notes (Signed)
Pt here for eval of hyperglycemia. Seen & discharged AMA from [facility] for same, per EMS was rude & belligerent to staff. States he's been out of his insulin for approx 3 days. Not taking home meds appropriately  VSS, NAD, CBG 408

## 2020-11-03 NOTE — ED Notes (Signed)
This RN explained to pt several times that pt is only allowed water and limited food intake d/t severely elevated blood sugar. Pt threw a cup of water at door. Security at bedside to escort pt out AMA at this time.

## 2020-11-03 NOTE — ED Notes (Signed)
Blood Glucose 701

## 2020-11-03 NOTE — ED Notes (Addendum)
Pt called 911 from cellular device seated in wheelchair in hallway while awaiting to be pushed to lobby. Pt using profanities at 911 dispatcher at this time. Pt escorted to lobby by security yelling "Fuck all y'all!" as he was leaving.

## 2020-11-04 ENCOUNTER — Emergency Department (HOSPITAL_COMMUNITY)
Admission: EM | Admit: 2020-11-04 | Discharge: 2020-11-04 | Disposition: A | Discharge status: Home / Self Care | Payer: Self-pay | Attending: Emergency Medicine | Admitting: Emergency Medicine

## 2020-11-04 ENCOUNTER — Encounter (HOSPITAL_COMMUNITY): Payer: Self-pay

## 2020-11-04 DIAGNOSIS — R739 Hyperglycemia, unspecified: Secondary | ICD-10-CM

## 2020-11-04 DIAGNOSIS — E1065 Type 1 diabetes mellitus with hyperglycemia: Secondary | ICD-10-CM | POA: Insufficient documentation

## 2020-11-04 DIAGNOSIS — F17210 Nicotine dependence, cigarettes, uncomplicated: Secondary | ICD-10-CM | POA: Insufficient documentation

## 2020-11-04 DIAGNOSIS — E1010 Type 1 diabetes mellitus with ketoacidosis without coma: Secondary | ICD-10-CM | POA: Insufficient documentation

## 2020-11-04 DIAGNOSIS — Z794 Long term (current) use of insulin: Secondary | ICD-10-CM | POA: Insufficient documentation

## 2020-11-04 DIAGNOSIS — Z79899 Other long term (current) drug therapy: Secondary | ICD-10-CM | POA: Insufficient documentation

## 2020-11-04 DIAGNOSIS — E1042 Type 1 diabetes mellitus with diabetic polyneuropathy: Secondary | ICD-10-CM | POA: Insufficient documentation

## 2020-11-04 DIAGNOSIS — E039 Hypothyroidism, unspecified: Secondary | ICD-10-CM | POA: Insufficient documentation

## 2020-11-04 DIAGNOSIS — Z7901 Long term (current) use of anticoagulants: Secondary | ICD-10-CM | POA: Insufficient documentation

## 2020-11-04 LAB — CBC WITH DIFFERENTIAL/PLATELET
Abs Immature Granulocytes: 0.01 10*3/uL (ref 0.00–0.07)
Basophils Absolute: 0 10*3/uL (ref 0.0–0.1)
Basophils Relative: 0 %
Eosinophils Absolute: 0.2 10*3/uL (ref 0.0–0.5)
Eosinophils Relative: 3 %
HCT: 35.4 % — ABNORMAL LOW (ref 39.0–52.0)
Hemoglobin: 12.2 g/dL — ABNORMAL LOW (ref 13.0–17.0)
Immature Granulocytes: 0 %
Lymphocytes Relative: 34 %
Lymphs Abs: 2.3 10*3/uL (ref 0.7–4.0)
MCH: 30.7 pg (ref 26.0–34.0)
MCHC: 34.5 g/dL (ref 30.0–36.0)
MCV: 88.9 fL (ref 80.0–100.0)
Monocytes Absolute: 0.5 10*3/uL (ref 0.1–1.0)
Monocytes Relative: 7 %
Neutro Abs: 3.7 10*3/uL (ref 1.7–7.7)
Neutrophils Relative %: 56 %
Platelets: 233 10*3/uL (ref 150–400)
RBC: 3.98 MIL/uL — ABNORMAL LOW (ref 4.22–5.81)
RDW: 13.3 % (ref 11.5–15.5)
WBC: 6.7 10*3/uL (ref 4.0–10.5)
nRBC: 0 % (ref 0.0–0.2)

## 2020-11-04 LAB — URINALYSIS, ROUTINE W REFLEX MICROSCOPIC
Bacteria, UA: NONE SEEN
Bilirubin Urine: NEGATIVE
Glucose, UA: >=500 mg/dL — AB
Hgb urine dipstick: NEGATIVE
Ketones, ur: 20 mg/dL — AB
Leukocytes,Ua: NEGATIVE
Nitrite: NEGATIVE
Protein, ur: NEGATIVE mg/dL
Specific Gravity, Urine: 1.025 (ref 1.005–1.030)
pH: 5 (ref 5.0–8.0)

## 2020-11-04 LAB — BASIC METABOLIC PANEL
Anion gap: 16 — ABNORMAL HIGH (ref 5–15)
Anion gap: 10 (ref 5–15)
BUN: 15 mg/dL (ref 6–20)
BUN: 9 mg/dL (ref 6–20)
CO2: 20 mmol/L — ABNORMAL LOW (ref 22–32)
CO2: 25 mmol/L (ref 22–32)
Calcium: 9.4 mg/dL (ref 8.9–10.3)
Calcium: 9.4 mg/dL (ref 8.9–10.3)
Chloride: 100 mmol/L (ref 98–111)
Chloride: 101 mmol/L (ref 98–111)
Creatinine, Ser: 1.2 mg/dL (ref 0.61–1.24)
Creatinine, Ser: 0.97 mg/dL (ref 0.61–1.24)
GFR, Estimated: >60 mL/min (ref >60)
GFR, Estimated: >60 mL/min (ref >60)
Glucose, Bld: 490 mg/dL — ABNORMAL HIGH (ref 70–99)
Glucose, Bld: 353 mg/dL — ABNORMAL HIGH (ref 70–99)
Potassium: 4.4 mmol/L (ref 3.5–5.1)
Potassium: 4.2 mmol/L (ref 3.5–5.1)
Sodium: 136 mmol/L (ref 135–145)
Sodium: 136 mmol/L (ref 135–145)

## 2020-11-04 LAB — CBG MONITORING, ED
Glucose-Capillary: >600 mg/dL (ref 70–99)
Glucose-Capillary: 381 mg/dL — ABNORMAL HIGH (ref 70–99)
Glucose-Capillary: 292 mg/dL — ABNORMAL HIGH (ref 70–99)

## 2020-11-04 LAB — CBC
HCT: 41.6 % (ref 39.0–52.0)
Hemoglobin: 13.7 g/dL (ref 13.0–17.0)
MCH: 29.9 pg (ref 26.0–34.0)
MCHC: 32.9 g/dL (ref 30.0–36.0)
MCV: 90.8 fL (ref 80.0–100.0)
Platelets: 297 10*3/uL (ref 150–400)
RBC: 4.58 MIL/uL (ref 4.22–5.81)
RDW: 13.2 % (ref 11.5–15.5)
WBC: 9.2 10*3/uL (ref 4.0–10.5)
nRBC: 0 % (ref 0.0–0.2)

## 2020-11-04 LAB — I-STAT VENOUS BLOOD GAS, ED
Acid-Base Excess: 2 mmol/L (ref 0.0–2.0)
Bicarbonate: 26.7 mmol/L (ref 20.0–28.0)
Calcium, Ion: 1.2 mmol/L (ref 1.15–1.40)
HCT: 37 % — ABNORMAL LOW (ref 39.0–52.0)
Hemoglobin: 12.6 g/dL — ABNORMAL LOW (ref 13.0–17.0)
O2 Saturation: 58 %
Potassium: 4.1 mmol/L (ref 3.5–5.1)
Sodium: 139 mmol/L (ref 135–145)
TCO2: 28 mmol/L (ref 22–32)
pCO2, Ven: 41.5 mmHg — ABNORMAL LOW (ref 44.0–60.0)
pH, Ven: 7.417 (ref 7.250–7.430)
pO2, Ven: 30 mmHg — CL (ref 32.0–45.0)

## 2020-11-04 LAB — BETA-HYDROXYBUTYRIC ACID: Beta-Hydroxybutyric Acid: 0.35 mmol/L — ABNORMAL HIGH (ref 0.05–0.27)

## 2020-11-04 MED ORDER — INSULIN ASPART 100 UNIT/ML ~~LOC~~ SOLN
5.0000 [IU] | Freq: Once | SUBCUTANEOUS | Status: AC
  Administered 2020-11-04: 5 [IU] via SUBCUTANEOUS

## 2020-11-04 NOTE — ED Notes (Signed)
Pt in bed, with bed alarm. Pt demanding food and drink, told that he needs to wait until the provider has seen him and assessed him. Falls band applied to pt. Pt wearing tennis shoes. Blinds open and next to nursing station.

## 2020-11-04 NOTE — ED Notes (Signed)
Pt was lying on the floor. Pt got up and fell on the floor onto his bottom. Ask pt if he could walk, pt begin yelling sometimes you should have just helped me. Staff helped pt off the floor and took him to the rest in a wheel chair

## 2020-11-04 NOTE — Social Work (Signed)
CSW following for d/c needs. Shella Spearing MSW LCSWA Transitions of Care  Clinical Social Worker  [facility] Dep[facility]  [phone]

## 2020-11-04 NOTE — ED Triage Notes (Signed)
Pt bib ems for hyperglycemia and leg pain, pt seen at [facility] [address] [facility] and here yesterday but left AMA. Pt has been out of his insulin for 3 days.  C>o right leg pain, hx of DVTs

## 2020-11-04 NOTE — Progress Notes (Signed)
Inpatient Diabetes Program Recommendations  AACE/ADA: New Consensus Statement on Inpatient Glycemic Control (2015)  Target Ranges:  Prepandial:   less than 140 mg/dL      Peak postprandial:   less than 180 mg/dL (1-2 hours)      Critically ill patients:  140 - 180 mg/dL   Lab Results  Component Value Date   GLUCAP 381 (H) 11/04/2020   HGBA1C 7.5 (H) 09/15/2020    Review of Glycemic Control  Inpatient Diabetes Program Recommendations:   Noted patient currently back to ED.  Patient has hx DM1 diabetes and received match letter in December 2021. Patient was discharged 09/30/20 on 70/30 35 units bid Cheapest insulin for patient to purchase would be Novolin Relion 70/30 insulin from [facility] (approximately $42 for insulin pens (5 pens = 1500 units) and $25 per vial (1000 units/vial).  Will follow. Transition of Care consult placed.  Thank you, Billy Fischer. Alex Mcmanigal, RN, MSN, CDE  Diabetes Coordinator Inpatient Glycemic Control Team Team Pager [phone] (8am-5pm) 11/04/2020 2:30 PM

## 2020-11-04 NOTE — Discharge Instructions (Addendum)
You will be contacted by our social worker in the morning to discuss obtaining your medications, as we discussed in the emergency department is important for you to take these medications to prevent long-term complications from your diabetes.  Please follow-up with [place] [facility], the number was provided in your discharge paperwork, please call to establish, they will be able to better manage your diabetes and get your refills on your medications in the future.

## 2020-11-04 NOTE — ED Provider Notes (Signed)
[facility] EMERGENCY [facility]PARTMENT Provider Note   CSN: [id number] Arrival date & time: 11/04/20  1358     History Chief Complaint  Patient presents with  . Hyperglycemia    Mark [name] is a 35 y.o. male.  The history is provided by the patient.  Hyperglycemia Blood sugar level PTA:  >300 Severity:  Moderate Onset quality:  Gradual Duration:  1 day Timing:  Constant Progression:  Waxing and waning Chronicity:  Recurrent Diabetes status:  Controlled with insulin Context: noncompliance   Relieved by:  None tried Ineffective treatments:  None tried Associated symptoms: dehydration, nausea and polyuria   Associated symptoms: no abdominal pain, no chest pain, no dysuria, no fever, no shortness of breath and no vomiting   Associated symptoms comment:  Muscle cramping Risk factors: hx of DKA        Past Medical History:  Diagnosis Date  . Anxiety   . Bipolar disorder (HCC)   . Depression   . Diabetes mellitus without complication (HCC)   . Diabetic polyneuropathy associated with type 1 diabetes mellitus (HCC)   . Hydrocephalus (HCC)   . Kidney stones   . Seizures [facility](HCC)     Patient Active Problem List   Diagnosis Date Noted  . Uncontrolled diabetes mellitus with hyperglycemia (HCC) 11/03/2020  . Right leg DVT (HCC) 11/03/2020  . Fever   . Insomnia due to mental disorder   . Acute metabolic encephalopathy   . Bipolar I disorder (HCC)   . Uncontrolled type 1 diabetes mellitus with hyperglycemia (HCC)   . Physical deconditioning   . Ambulatory dysfunction   . AMS (altered mental status) 09/02/2020  . Seizures (HCC)   . Leukocytosis 06/22/2020  . Hyperkalemia 04/10/2020  . AKI (acute kidney injury) (HCC) 04/10/2020  . Left leg cellulitis 03/23/2020  . Bipolar I disorder, current or most recent episode depressed, in partial remission (HCC)   . MDD (major depressive disorder), recurrent episode, severe (HCC) 03/13/2020  . Drug overdose,  intentional (HCC) 03/05/2020  . Drug overdose 03/05/2020  . Drug abuse (HCC) 03/05/2020  . Attempted suicide (HCC) 03/05/2020  . Insulin overdose   . Hypoglycemia 02/03/2020  . Hypothyroidism 02/02/2020  . Diabetic polyneuropathy associated with type 1 diabetes mellitus (HCC) 02/02/2020  . Type 1 diabetes mellitus with hyperosmolar hyperglycemic state (HHS) (HCC) 12/30/2019  . DKA (diabetic ketoacidosis) (HCC) 12/29/2019  . Homelessness 12/29/2019  . Marijuana abuse 12/29/2019  . Tobacco dependence 12/29/2019  . Seizure disorder (HCC) 12/29/2019  . Suicidal ideation   . Bipolar I disorder, most recent episode depressed, severe without psychotic features (HCC)   . Adjustment disorder with depressed mood 12/07/2017  . Noncompliance 10/17/2017  . Dysthymia 10/08/2017  . Personality disorder in adult [facility](HCC) 09/26/2017  . 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 Obstructive hydrocephalus (HCC) 09/19/2017  . MDD (major depressive disorder) 08/22/2017  . Severe recurrent major depression with psychotic features (HCC) 08/19/2017  . Abdominal pain 08/18/2017  . Hyponatremia 08/18/2017  . DKA (diabetic ketoacidoses) 11/24/2015  . Diabetic keto-acidosis (HCC) 10/23/2015    Past Surgical History:  Procedure Laterality Date  . KIDNEY STONE SURGERY         Family History  Problem Relation Age of Onset  . Breast cancer Mother     Social History   Tobacco Use  . Smoking status: Current Every Day Smoker    Packs/day: 1.00    Years: 20.00    Pack years: 20.00    Types: Cigarettes  .  Smokeless tobacco: Never Used  Vaping Use  . Vaping Use: Never used  Substance Use Topics  . Alcohol use: Yes    Comment: occassional   . Drug use: Yes    Frequency: 7.0 times per week    Types: Marijuana    Comment: smoked 2-3 days ago, usually smokes daily    Home Medications Prior to Admission medications   Medication Sig Start Date End Date Taking? Authorizing Provider  acetaminophen (TYLENOL) 325 MG  tablet Take 2 tablets (650 mg total) by mouth every 6 (six) hours as needed for mild pain, fever or headache. 09/30/20   Russella Dar, NP  atorvastatin (LIPITOR) 20 MG tablet Take 1 tablet (20 mg total) by mouth daily. 09/30/20   Russella Dar, NP  divalproex (DEPAKOTE) 250 MG DR tablet Take 3 tablets (750 mg total) by mouth every evening. 09/30/20   Russella Dar, NP  divalproex (DEPAKOTE) 500 MG DR tablet Take 1 tablet (500 mg total) by mouth daily. 10/01/20   Russella Dar, NP  escitalopram (LEXAPRO) 20 MG tablet Take 1 tablet (20 mg total) by mouth daily. Patient not taking: Reported on 11/03/2020 10/01/20   Russella Dar, NP  gabapentin (NEURONTIN) 100 MG capsule Take 1 capsule (100 mg total) by mouth 2 (two) times daily. 09/30/20   Russella Dar, NP  insulin aspart protamine- aspart (NOVOLOG MIX 70/30) (70-30) 100 UNIT/ML injection Inject 0.35 mLs (35 Units total) into the skin 2 (two) times daily with a meal. 09/30/20   Russella Dar, NP  levothyroxine (SYNTHROID) 50 MCG tablet Take 1 tablet (50 mcg total) by mouth daily at 6 (six) AM. 10/01/20   Russella Dar, NP  pyridOXINE (B-6) 50 MG tablet Take 1 tablet (50 mg total) by mouth daily. 10/01/20   Russella Dar, NP  QUEtiapine (SEROQUEL) 100 MG tablet Take 1 tablet (100 mg total) by mouth every morning. 10/01/20   Russella Dar, NP  QUEtiapine (SEROQUEL) 400 MG tablet Take 1 tablet (400 mg total) by mouth at bedtime. 09/30/20   Russella Dar, NP  RIVAROXABAN Carlena Hurl) VTE STARTER PACK (15 & 20 MG) Follow package directions: Take one 15mg  tablet by mouth twice a day. On day 22, switch to one 20mg  tablet once a day. Take with food. Patient taking differently: Take 15-20 mg by mouth as directed. Follow package directions: Take one 15mg  tablet by mouth twice a day. On day 22, switch to one 20mg  tablet once a day. Take with food. 10/02/20   Long, , MD  insulin aspart (NOVOLOG) 100 UNIT/ML injection Inject 10  Units into the skin 3 (three) times daily with meals. Patient not taking: Reported on 03/23/2020 03/13/20 04/04/20  Arlyss Repress, MD  pregabalin (LYRICA) 150 MG capsule Take 1 capsule (150 mg total) by mouth 2 (two) times daily. Patient not taking: Reported on 03/23/2020 12/31/19 04/04/20  Lanae Boast, MD    Allergies    Mushroom extract complex, Penicillins, Sulfa antibiotics, and Clindamycin/lincomycin  Review of Systems   Review of Systems  Constitutional: Negative for chills and fever.  HENT: Negative for ear pain and sore throat.   Eyes: Negative for pain and visual disturbance.  Respiratory: Negative for cough and shortness of breath.   Cardiovascular: Negative for chest pain and palpitations.  Gastrointestinal: Positive for nausea. Negative for abdominal pain and vomiting.  Endocrine: Positive for polyuria.  Genitourinary: Negative for dysuria and hematuria.  Musculoskeletal: Negative for arthralgias and  back pain.       Muscle cramps  Skin: Negative for color change and rash.  Neurological: Negative for seizures and syncope.  All other systems reviewed and are negative.   Physical Exam Updated Vital Signs BP 129/84   Pulse 79   Temp 98.2 F (36.8 C) (Oral)   Resp 16   SpO2 100%   Physical Exam Vitals and nursing note reviewed.  Constitutional:      Appearance: He is well-developed and well-nourished.     Comments: Poorly groomed  HENT:     Head: Normocephalic and atraumatic.     Comments: Poor dentition, partially adentulous Eyes:     Conjunctiva/sclera: Conjunctivae normal.  Cardiovascular:     Rate and Rhythm: Normal rate and regular rhythm.     Heart sounds: No murmur heard.   Pulmonary:     Effort: Pulmonary effort is normal. No respiratory distress.     Breath sounds: Normal breath sounds.  Abdominal:     Palpations: Abdomen is soft.     Tenderness: There is no abdominal tenderness.  Musculoskeletal:        General: No edema.     Cervical back: Neck  supple.  Skin:    General: Skin is warm and dry.  Neurological:     Mental Status: He is alert.  Psychiatric:        Mood and Affect: Mood and affect normal.     ED Results / Procedures / Treatments   Labs (all labs ordered are listed, but only abnormal results are displayed) Labs Reviewed  BASIC METABOLIC PANEL - Abnormal; Notable for the following components:      Result Value   Glucose, Bld 353 (*)    All other components within normal limits  BETA-HYDROXYBUTYRIC ACID - Abnormal; Notable for the following components:   Beta-Hydroxybutyric Acid 0.35 (*)    All other components within normal limits  CBC WITH DIFFERENTIAL/PLATELET - Abnormal; Notable for the following components:   RBC 3.98 (*)    Hemoglobin 12.2 (*)    HCT 35.4 (*)    All other components within normal limits  CBG MONITORING, ED - Abnormal; Notable for the following components:   Glucose-Capillary 381 (*)    All other components within normal limits  I-STAT VENOUS BLOOD GAS, ED - Abnormal; Notable for the following components:   pCO2, Ven 41.5 (*)    pO2, Ven 30.0 (*)    HCT 37.0 (*)    Hemoglobin 12.6 (*)    All other components within normal limits  CBG MONITORING, ED - Abnormal; Notable for the following components:   Glucose-Capillary 292 (*)    All other components within normal limits    EKG None  Radiology No results found.  Procedures Procedures (including critical care time)  Medications Ordered in ED Medications  insulin aspart (novoLOG) injection 5 Units (5 Units Subcutaneous Given 11/04/20 2039)    ED Course  I have reviewed the triage vital signs and the nursing notes.  Pertinent labs & imaging results that were available during my care of the patient were reviewed by me and considered in my medical decision making (see chart for details).    MDM Rules/Calculators/A&P                          This is a 35 year old male with past medical history as above who presents  emergency department for hyperglycemia in the setting of medication noncompliance and  not being able to fill his medication secondary to finances.  Patient has had multiple emergency department visits over the last few weeks in regards to medication noncompliance and inability to obtain his medications.  He reports he does not have any test trips, but experiences some nausea and polyuria when his blood sugar is high which she was experiencing over the last few days.  Blood glucose was 381 on arrival, was rechecked once he was brought back to the room was 292.  On exam he is afebrile, hemodynamically stable, no acute distress, well-appearing.  He does not have any active vomiting and is not significantly dehydrated on my exam.  He is tolerating water without difficulty in the emergency department.  His pH is within normal limits, doubt diabetic ketoacidosis.  He was given 5 units of aspart insulin, tolerating p.o. with water and a sandwich in the emergency department.  Social work was engaged, there is no on-call social work Engineer, civil (consulting) at this time, but the consult order will remain and then they will contact the patient in the morning to follow-up in regards to get him his medications.  Multiple attempts have been made in the past to have this patient obtain his medications with financial assistance that he has not followed up with.  We discussed the importance of taking his medications in the long-term complications that can arise if he is not compliant, he understands this and states that he is willing to follow-up in uses medications that social work will contact him for tomorrow.  Final Clinical Impression(s) / ED Diagnoses Final diagnoses:  Hyperglycemia    Rx / DC Orders ED Discharge Orders    None       Kathleen Lime, MD 11/04/20 2225    Eber Hong, MD 11/05/20 1500

## 2020-11-04 NOTE — ED Notes (Signed)
Patient left while waiting to be seen in the waiting room

## 2020-11-04 NOTE — ED Provider Notes (Signed)
I saw and evaluated the patient, reviewed the resident's note and I agree with the findings and plan.  Pertinent History: Pt with hx of non compliance with meds for DM - out of insulin - states that he has been a little high today but feels well - states he has no nausea, no aches, no headache, no vomiting, no diarrhea   Pertinent Exam findings: normal VS - pulse of 84  Today's Vitals   11/04/20 1406 11/04/20 1624 11/04/20 1806  BP: 121/80 119/83 117/82  Pulse: 100 98 84  Resp: 16 16 16   Temp: 98.8 F (37.1 C) 98.4 F (36.9 C)   TempSrc: Oral Oral   SpO2: 100% 100% 100%  PainSc: 10-Worst pain ever     He has normal mucous membranes, poor dentition, no distress, answering questions appropriately.  Needs to be discharged home with medications as long as he is not in DKA.  Consult social work to see if they can help with medicines  I was personally present and directly supervised the following procedures:  Medical evaluation and stabilizing care for hyperglycemia  I personally interpreted the EKG as well as the resident and agree with the interpretation on the resident's chart.  Final diagnoses:  Hyperglycemia      , MD 11/05/20 1500

## 2020-11-04 NOTE — Social Work (Signed)
CSW met with Pt at bedside. Pt was polite and cooperative. Pt [facility] that [facility] [address], EDP updated.  TOC RN will follow up tomorrow for medication match. [facility] RN updated via chat) CSW provided 2 bus passes to enable Pt's roommate to obtain medications on 1/15 due to Pt's lowered ability to ambulate.   Pt reports change of phone # to [phone]. CSW updated demographic sheet.  Pt did not follow up with last appointment at [facility], [facility] team will attempt to reset appointment on Monday 1/17 and update Pt.

## 2020-11-05 ENCOUNTER — Telehealth: Payer: Self-pay

## 2020-11-05 ENCOUNTER — Emergency Department (HOSPITAL_COMMUNITY)
Admission: EM | Admit: 2020-11-05 | Discharge: 2020-11-05 | Disposition: A | Discharge status: Home / Self Care | Payer: Self-pay | Attending: Emergency Medicine | Admitting: Emergency Medicine

## 2020-11-05 ENCOUNTER — Other Ambulatory Visit: Payer: Self-pay

## 2020-11-05 ENCOUNTER — Encounter (HOSPITAL_COMMUNITY): Payer: Self-pay | Admitting: Emergency Medicine

## 2020-11-05 DIAGNOSIS — R739 Hyperglycemia, unspecified: Secondary | ICD-10-CM | POA: Insufficient documentation

## 2020-11-05 DIAGNOSIS — Z5321 Procedure and treatment not carried out due to patient leaving prior to being seen by health care provider: Secondary | ICD-10-CM | POA: Insufficient documentation

## 2020-11-05 LAB — CBC
HCT: 38.6 % — ABNORMAL LOW (ref 39.0–52.0)
Hemoglobin: 13.2 g/dL (ref 13.0–17.0)
MCH: 30.9 pg (ref 26.0–34.0)
MCHC: 34.2 g/dL (ref 30.0–36.0)
MCV: 90.4 fL (ref 80.0–100.0)
Platelets: 254 10*3/uL (ref 150–400)
RBC: 4.27 MIL/uL (ref 4.22–5.81)
RDW: 13.3 % (ref 11.5–15.5)
WBC: 12.7 10*3/uL — ABNORMAL HIGH (ref 4.0–10.5)
nRBC: 0 % (ref 0.0–0.2)

## 2020-11-05 LAB — BASIC METABOLIC PANEL
Anion gap: 14 (ref 5–15)
BUN: 8 mg/dL (ref 6–20)
CO2: 21 mmol/L — ABNORMAL LOW (ref 22–32)
Calcium: 9.1 mg/dL (ref 8.9–10.3)
Chloride: 97 mmol/L — ABNORMAL LOW (ref 98–111)
Creatinine, Ser: 1.06 mg/dL (ref 0.61–1.24)
GFR, Estimated: >60 mL/min (ref >60)
Glucose, Bld: 660 mg/dL (ref 70–99)
Potassium: 4.2 mmol/L (ref 3.5–5.1)
Sodium: 132 mmol/L — ABNORMAL LOW (ref 135–145)

## 2020-11-05 LAB — CBG MONITORING, ED: Glucose-Capillary: >600 mg/dL (ref 70–99)

## 2020-11-05 MED ORDER — INSULIN ASPART PROT & ASPART (70-30 MIX) 100 UNIT/ML ~~LOC~~ SUSP: 35.0000 [IU] | Freq: Two times a day (BID) | SUBCUTANEOUS | 0 refills | Status: DC

## 2020-11-05 NOTE — ED Notes (Signed)
Pt left without being seen.

## 2020-11-05 NOTE — Telephone Encounter (Signed)
Patient called back and resources given for [facility] to call Monday Morning. He [facility].

## 2020-11-05 NOTE — Telephone Encounter (Signed)
Insulin ordered to pharmacy as did not get a prescription for his insulin yesterday.

## 2020-11-05 NOTE — Telephone Encounter (Signed)
Mark [name] called stating he was in the [facility] ED again with high blood sugars. He stated his script was not at the pharmacy. I called MD [place] to look at his chart to see if he could order the insulin that was not ordered last night. [facility] [address] The patient became agitated in the ED waiting room a nd was texting this CM ad nauseum to ask when this was going to happen and if it was free. Supervisor Kathyrn Sheriff overrode MATCH and this CM [facility] to ensure it was free. They will make sure it is and the patient can come pick up the script within the hour. This was relayed to the patient.The patient was given a voucher [facility], then he requested another voucher, which the CSW had already told him that we can not provide.

## 2020-11-05 NOTE — Telephone Encounter (Signed)
Called patient phone left a message to return my call.

## 2020-11-05 NOTE — ED Triage Notes (Signed)
Pt to triage via [facility].  Seen in ED last night for hyperglycemia.  States he was told to [facility] this [facility].  States he called pharmacy and they didn't have Rx.  CBG 539 PTA.

## 2020-11-23 ENCOUNTER — Other Ambulatory Visit: Payer: Self-pay | Admitting: *Deleted

## 2020-11-23 ENCOUNTER — Encounter: Payer: Self-pay | Admitting: *Deleted

## 2020-11-23 NOTE — Patient Instructions (Signed)
Type 1 Diabetes Mellitus, Self-Care, Adult When you have type 1 diabetes (type 1 diabetes mellitus), you must make sure your blood sugar (glucose) stays in a healthy range. You can do this with:  Insulin.  Healthy foods.  Exercise.  Lifestyle changes.  Other medicines, if needed.  Support from doctors and others. What are the risks? Having diabetes can put you at risk for heart disease and kidney disease. These problems can get worse if you do not take good care of yourself and keep your blood sugar in a healthy range. How to stay aware of blood sugar  Check your blood sugar every day, as often as told.  Have your A1C (hemoglobin A1C) level checked two or more times a year. Have it checked more often if your doctor tells you to.  Try to meet the treatment goals set by your doctor. Try to have these blood sugar levels: ? Before meals (preprandial): 80-130 mg/dL (4.4-7.2 mmol/L). ? After meals (postprandial): below 180 mg/dL (10 mmol/L). ? A1C level: less than 7%.   Follow these instructions at home: Medicines  Take over-the-counter and prescription medicines only as told by your doctor.  Take insulin and medicines every day as told.  Do not run out of insulin or medicines.  Change the amount of insulin you take based on how active you are and what foods you eat. Your doctor will tell you the amount to take. Eating and drinking  Choose healthy foods, such as: ? Low-fat (lean) proteins. ? Complex carbs (carbohydrates), such aswhole wheat. ? Fresh fruits and vegetables. ? Low-fat dairy products. ? Healthy fats.  Meet with a food expert (dietitian) to help you make an eating plan that is right for you. Be sure to: ? Follow instructions from your doctor about what you cannot eat or drink. ? Drink enough fluid to keep your pee (urine) pale yellow. ? Keep track of carbs that you eat. Read food labels and learn food serving sizes. ? Follow your sick-day plan when you cannot  eat or drink normally. Make this plan with your doctor so it is ready.  Have a 15-gram fast-acting carb snack with you at all times to treat low blood sugar.   Activity  Get regular exercise as told.  Spread out your exercise over 3 or more days a week.  Do not go more than 2 days in a row without exercise.  Talk with your doctor before you start a new exercise. Lifestyle  Do not use any products that contain nicotine or tobacco, such as cigarettes, e-cigarettes, and chewing tobacco. If you need help quitting, ask your doctor.  If your doctor says that alcohol is safe for you: ? Limit how much you use to:  0-1 drink a day for women who are not pregnant.  0-2 drinks a day for men. ? Be aware of how much alcohol is in your drink. In the [place] one drink equals one 12 oz bottle of beer (355 mL), one 5 oz glass of wine (148 mL), or one 1 oz glass of hard liquor (44 mL).  Learn to lower stress. If you need help, ask your doctor. Body care  Stay up to date with your shots (vaccinations) as told by your doctor. Get shots for: ? The flu. ? Pneumonia. ? Hepatitis B.  Have your eyes and feet checked by a doctor as often as told.  Check your skin and feet every day. Check for cuts, bruises, redness, blisters, or sores.    Brush your teeth and gums two times a day. Floss one or more times a day.  Go to the dentist one or more times every 6 months.  Stay at a healthy weight.   General instructions  Share your diabetes care plan with people at work, school, and home.  Make sure your family and close friends learn the symptoms of low blood sugar and can help treat you. You will need their help if you cannot treat yourself.  Check your pee for ketones when you are sick and as told.  Carry a card or wear jewelry that says you have diabetes.  Keep all follow-up visits as told by your doctor. This is important. Questions to ask your doctor  Do I need to meet with a diabetes  educator?  Where can I find a support group for people with diabetes?  Should I have a glucagon shot kit at home? Where to find more information  American Diabetes Association (ADA): diabetes.org  Association of Diabetes Care and Education Specialists (ADCES): diabeteseducator.org  International Diabetes Federation (IDF): https://www.munoz-bell.org/ Get help right away if:  Your blood sugar is below 54 mg/dL (3 mmol/L).  You have medium or large levels of ketones in your pee. These symptoms may be an emergency. Do not wait to see if the symptoms will go away. Get medical help right away. Call your local emergency services (911 in the [place] Do not drive yourself to the hospital. Summary  When you have type 1 diabetes, you must make sure your blood sugar (glucose) stays in a healthy range.  Diabetes can raise your risk for heart disease and kidney disease. These problems can get worse if you do not take good care of yourself and keep your blood sugar in a healthy range.  Check your blood sugar every day, as often as told.  Keep all follow-up visits as told by your doctor. This is important. This information is not intended to replace advice given to you by your health care provider. Make sure you discuss any questions you have with your health care provider. Document Revised: 11/26/2019 Document Reviewed: 11/26/2019 Elsevier Patient Education  2021 [employer].

## 2020-11-23 NOTE — Patient Outreach (Addendum)
[name]) Care Management  11/23/2020  Mark [name] 1985/11/20 [id number]   Referral received: 11/22/20 Referral source: [name] Referral reason : 2 or more ED visits in January    Admissions in last 6 months : 1, ED visits in last 6 months: 7   PMHX:  Diabetes type 1,insulin dependent, Right leg DVT, seizure disorder,hydoephalus ,  anxiety, bipolar disorder,  diabetic polyneuropathy.   Outreach attempt #1 Subjective: Outreach call to patient, explained reason for Mark call, Mark [name] care management services.  Patient reports that he is doing okay . Discussed patient with Mark [name] plan , he states he was unaware of having an insurance plan and does not recall signing up for plan but states he applied for medicaid.    Social Patient lives in home with friend and co home owner .Mark [name] states that he is able to perform his daily ADL and does assist with cooking as he has an Proofreader. He reports some difficulty with mobility related to right leg. He reports having walker for home use but does not use holds to wall. He reports having a wheelchair for use but does not use . He reports having frequent falls.  He reports receiving food stamps. He states because of some limits in mobility, his friend usually does Mark shopping at store that is in walking distance as they don't drive takes bus as able. Patient states that he has applied for medicaid and is in pending process.     Conditions Patient discussed history of diabetes since his age 35's. Discussed symptoms of low blood sugar, he denies having symptoms of low blood sugar as reviewed, he discussed action plan if symptoms . He reports that he continues to drink sugar sodas due to cost of foods may not eat Mark best foods.  He reports history of blood clot in right leg had been taking blood thinner. He discussed not sleeping well taking tylenol pm as he does not have other medications. He  discussed  history of depression no recent follow up . Marland Kitchen    Medications  Patient discussed having difficulty with affording medications. He is unable to review medication list.and states that he does not have all  Medications and difficulty affording medications.   He does reports being out of Xarelto. He reports being provided Xarelto at ED discharge on 12/14. He states that he is currently out of Xarelto and does not have money for refill. Placed call to Mark [name] at [name] they report only having prescription for starter pack of Xarelto, and patient does not have insurance on file and would need to present insurance to card in order to determine cost of medication.  He reports having at least an week supply of Insulin 70/30 , he reports taking 20 units 3 times a day. Attempted to explain recent instructions at discharge for 70/30 twice daily encouraged to take as prescribed. He reports having glucose monitor and supplies but he does not check readings, reports trying to save strips. Patient discussed concerns with cost of purchasing/managing medications. Encouraged to make sure his pharmacy has his card on file to help with cost of medications.   Consent  Explained and offered Mark [name] care management services for management of chronic condition, coordinating getting PCP follow up, pharmacy referral related to medication cost and management , address transportation needs to appointments as arranged.   Appointments Patient agreeable to arranging appointment with primary care provider to establish care . He was agreeable to  placing call to Mark [name] where he had previously been scheduled appointment but cancelled . Placed 3 way call to office patient scheduled appointment 1st available appointment not until 4/21 he accepted appointment. Discussed with patient sooner appointment with provider would be beneficial to address medication needs he is agreeable.   Care Coordination  Placed call to [name] with patient in 3 way call with patient agreement Patient able to request information regarding benefits of program,he  requested insurance card copy be sent to his email address he provided.     Plan Discussed patient case  with Mark [name] , [name] Care Management assistant director, will make contact with Mark [name] to see if patient eligible for program for  sooner medical follow up.  Will continue to seek sooner PCP as patient is agreeable.  Will place pharmacy consult for concerns related to medication cost and will benefit pharmacy delivery type service Will place Care Guide referral for transportation needs as arise.  Patient agreeable to return call in Mark next 3  Business days regarding follow up regarding medical follow plan.  Will send patient welcome letter, AVS    Mark Garibaldi, RN, BSN  [facility] Care Management,Care Management Coordinator  [phone]- Mobile [phone]- Toll Free Main Office

## 2020-11-24 ENCOUNTER — Other Ambulatory Visit: Payer: Self-pay | Admitting: *Deleted

## 2020-11-24 DIAGNOSIS — F319 Bipolar disorder, unspecified: Secondary | ICD-10-CM

## 2020-11-24 DIAGNOSIS — E1069 Type 1 diabetes mellitus with other specified complication: Secondary | ICD-10-CM

## 2020-11-24 DIAGNOSIS — I82491 Acute embolism and thrombosis of other specified deep vein of right lower extremity: Secondary | ICD-10-CM

## 2020-11-24 NOTE — Patient Outreach (Signed)
[facility]) Care Management  11/24/2020  Mark [name] [birth year] [id number]   Care Coordination call    Subjective: Placed call to [facility] to discuss patient potential eligibility for services,able to leave message for Cindra Eves for return call.   Placed call to patient to discuss getting sooner appointment for PCP follow up to assist with prescriptions for medications.  Patient again states [facility] [address] his [facility].  [facility] [address] to inquire regarding patient having prescription on file, representative state patient does not have prescriptions at that local pharmacy.  Discussed with patient [facility] has one prescription on file for Xarelto starter pack dated from 12/14. In reviewing records noted patient has prescriptions from ED visit 12/11-12/12 sent to transition of care pharmacy with 6 refills. Placed call to transition of care pharmacy representative she states if patient has prescriptions bottles from when medication filled on 12/13  he can take to his local pharmacy and request prescription be transferred from transition of care pharmacy 1400  Return call to patient.; Patient states that he does not have prescriptions bottles. He states that his co home owner would be able to assist with purchasing medications.. [facility] [address] is closet pharmacist to him and states prescriptions can be sent there. . Return call [facility] pharmacy spoke with Marylene Land states medications prescribed, 12/12 has 6 refills on and can be transferred to patient pharmacy. Requested prescriptions be sen to [facility] pharmacy [name] [address]as patient preference. Encouraged patient to take copy of bright health card to pharmacy to have on file to get  prescriptions.   Patient agreeable to Care Coordinator assisting with  scheduling sooner MD appointment for follow up a assist with needed prescriptions.   Placed call to [facility] wellness center to inquire regarding sooner post ED visit,no available at this location. Representative states appointment available at [facility] for 2/17 at 2:30. Discussed with patient and he is agreeable.  He states that he will need assistance with transportation. Marland Kitchen  3:30 Return call from Ginger Carne ,[facility]  she states that she has reviewed patient case, she discussed their focus on geriatric population, they will not will be able to accept patient in program.    Plan Patient agreeable to return call in the next 4 business days, encouraged patient to contact Care Coordinator sooner for new concerns, verified he has contact number.  Will place referral to  patient care guide for transportation to medical appointment.    [name], RN, BSN  [facility] Care Management,Care Management Coordinator  [phone]- Mobile [phone]- Toll Free Main Office

## 2020-11-25 ENCOUNTER — Other Ambulatory Visit: Payer: Self-pay | Admitting: *Deleted

## 2020-11-25 NOTE — Patient Outreach (Signed)
[facility]) Care Management  11/25/2020  Mark [name] 10-Sep-1986 [id number]   Care coordination  Call to [facility] at [facility] at schedule a sooner post ED visit than prior scheduled for 2/17 with [facility] as patient agreeable for Care Coordinator to seek sooner visit.                   Subjective: Outreach call to patient to discuss arranging sooner appointment with medical provider on Tuesday, February 8 at 2:30 pm. Patient states that he will need transportation to appointment,discussed that I will follow and plan return call in the next business day.  Reminded patient to take copy of [facility] insurance card that he requested being sent to his email on phone, to visit with him and [facility] assist with getting prescriptions with copayment that will be due.   Discussed with [name] director, I will send email message to [email or web address]  regarding patient appointment information, his contact information along with patient pharmacy address to stop patient by for pick of   prescriptions after visit prior to transporting patient home.    Plan Will plan follow up call to patient in the next business day, patient agrees with plan.    [name], RN, BSN  [facility] Care Management,Care Management Coordinator  [phone]- Mobile [phone]- Toll Free Main Office

## 2020-11-25 NOTE — Addendum Note (Signed)
Addended by: [name] A on: 11/25/2020 02:56 PM   Modules accepted: Orders

## 2020-11-28 ENCOUNTER — Inpatient Hospital Stay: Payer: 59 | Admitting: Physician Assistant

## 2020-11-28 ENCOUNTER — Telehealth: Payer: Self-pay | Admitting: General Practice

## 2020-11-28 ENCOUNTER — Other Ambulatory Visit: Payer: Self-pay | Admitting: *Deleted

## 2020-11-28 NOTE — Patient Outreach (Addendum)
[facility]) Care Management  11/28/2020  Mark [name] 04/07/1986 [id number]   Care Coordination   Referral received: 11/22/20 Referral source: [facility] Referral reason : 2 or more ED visits in January    Admissions in last 6 months : 1, ED visits in last 6 months: 7   PMHX:  Diabetes type 1,insulin dependent, Right leg DVT, seizure disorder,hydoephalus ,  anxiety, bipolar disorder,  diabetic polyneuropathy.    Subjective: Unsuccessful outreach call to patient,no answer received message , subscriber not available.   1300 Received return call from patient. He reports receiving phone call from [facility] transportation services for his Dr. Astronomer on tomorrow. He discussed that he is doing pretty good on today, denies new concerns. He reports that he has eaten and taking insulin on today, questions recent blood sugar check he states a few days ago he was able to check it , it was in the 100's. Discussed with patient his ability to get from home to street for transportation he states that he thinks he would be able to do that ,he states at times being outside to walk the dog. He declined need for use of wheel chair transportation to this appointment. Patient states that he has not received insurance card in the mail yet, and states that he is having difficulty with email he provided to send insurance card to.  Patient agreeable to return call to [facility] regarding need to have insurance card information.   With patient on phone ,Placed call to [facility] Customer service to inquire again regarding patient insurance card, during call identified wrong address in [facility] system, updated. Representative states that patient would need to [employer] place to update his address in system . Explained to representative patient need for insurance card  to be able to get prescriptions, she states that she can transfer to pharmacy department to  be able to send information pharmacy.  Transferred to pharmacy dept. Explained to that representative patient need for insurance card to be sent to pharmacy , representative states that they are unable to send patient card information but she will be able to provide information that pharmacy will need to fill prescription. Pharmacy representative provided, BIN #, PCN, and group number, patient verified that he has written this information down and read back during call to verify.  With patient on call placed call to [employer] to update address in system, he provided current address and information requested. Representative states patient to receive plan information and insurance card in the next 2 weeks.   Patient questions whether his roommate can accompany him in transportation to appointment, email message to Transportation service, [name] confirms that patient roommate my accompany him to appointment , this was communicated to patient.   Patient questions what cost of copayment for medications will be, I explained that I am unable to determine that , pharmacy will will need to run prescriptions once received to determine cost. At this time he states plan if for his friend to assist with copayments on prescriptions. Encouraged patient to contact Care coordinator if cost concerns.   Care Coordination  [facility] with information on insurance card in order to fill prescriptions, Member ID number, BIN, Group #, representative agrees that has patient in system.    Plan Patient agreeable to follow up call in the next 4 business days,for continued assessment of needs and additional support referrals,  encouraged to call  sooner if new concerns.    [name], RN, BSN  [facility] Care Management,Care Management Coordinator  [phone]- Mobile [phone]- Toll Free Main Office

## 2020-11-28 NOTE — Telephone Encounter (Signed)
   Mark [name] DOB: June 01, 1986 MRN: [id number]   RIDER WAIVER AND RELEASE OF LIABILITY  For purposes of improving physical access to our facilities, Mark [name] is pleased to partner with third parties to provide Mark [name] patients or other authorized individuals the option of convenient, on-demand ground transportation [name] (the Chiropractor") through use of the technology service that enables users to request on-demand ground transportation from independent third-party providers.  By opting to use and accept these Mark [name], I, the undersigned, hereby agree on behalf of myself, and on behalf of any minor child using the Mark [name] for whom I am the parent or legal guardian, as follows:  1. Science writer provided to me are provided by independent third-party transportation providers who are not Mark [name] or employees and who are unaffiliated with Mark [name]. 2. Mark [name] is neither a transportation carrier nor a common or public carrier. 3. Mark [name] has no control over the quality or safety of the transportation that occurs as a result of the Mark [name]. 4. Mark [name] cannot guarantee that any third-party transportation provider will complete any arranged transportation service. 5. Mark [name] makes no representation, warranty, or guarantee regarding the reliability, timeliness, quality, safety, suitability, or availability of any of the Mark [name] or that they will be error free. 6. I fully understand that traveling by vehicle involves risks and dangers of serious bodily injury, including permanent disability, paralysis, and death. I agree, on behalf of myself and on behalf of any minor child using the Mark [name] for whom I am the parent or legal guardian, that the entire risk arising out of my use of the Mark [name] remains solely with me, to the maximum extent permitted under applicable law. 7. The [employer] are provided "as is" and "as available." Mark [name] disclaims all representations and warranties, express, implied or statutory, not expressly set out in these terms, including the implied warranties of merchantability and fitness for a particular purpose. 8. I hereby waive and release Mark [name], its agents, employees, officers, directors, representatives, insurers, attorneys, assigns, successors, subsidiaries, and affiliates from any and all past, present, or future claims, demands, liabilities, actions, causes of action, or suits of any kind directly or indirectly arising from acceptance and use of the Mark [name]. 9. I further waive and release Mark [name] and its affiliates from all present and future liability and responsibility for any injury or death to persons or damages to property caused by or related to the use of the Mark [name]. 10. I have read this Waiver and Release of Liability, and I understand the terms used in it and their legal significance. This Waiver is freely and voluntarily given with the understanding that my right (as well as the right of any minor child for whom I am the parent or legal guardian using the Mark [name]) to legal recourse against  in connection with the Mark [name] is knowingly surrendered in return for use of these [name].   I attest that I read the consent document to Mark [name], gave Mark [name] the opportunity to ask questions and answered the questions asked (if any). I affirm that Mark [name] then provided consent for he's participation in this program.     Mark [name]

## 2020-11-29 ENCOUNTER — Telehealth: Payer: Self-pay

## 2020-11-29 ENCOUNTER — Inpatient Hospital Stay: Payer: 59 | Admitting: Physician Assistant

## 2020-11-29 NOTE — Telephone Encounter (Signed)
Contacted patient by phone to follow up on rescheduling appt. ULVM

## 2020-12-01 ENCOUNTER — Other Ambulatory Visit: Payer: Self-pay | Admitting: *Deleted

## 2020-12-01 NOTE — Patient Outreach (Signed)
[facility]) Care Management  [facility] Care Manager  12/01/2020   Mark [name] 1986-07-12 [id number]    Telephone Assessment   Referral received: 11/22/20 Referral source: [facility] Referral reason : 2 or more ED visits in January    Admissions in last 6 months : 1, ED visits in last 6 months: 7   PMHX:  Diabetes type 1,insulin dependent, Right leg DVT, seizure disorder,hydoephalus ,  anxiety, bipolar disorder,  diabetic polyneuropathy.   Subjective:  Successful outreach call to patient. Discussed follow up on scheduled PCP visit on 2/8, he reports that transportation did not arrive. He states he and his roommate was outside waiting. He later states receiving call from doctor office that appointment was cancelled, ( per appointment desk in Epic patient no show). Discussed with patient importance of medical follow up doctor for addressing ongoing care needs, medications prescriptions, he voiced agreement. He states that he is agreeable to rescheduling office visit, but he would need transportation.   Care coordination  Placed email to [facility] health transportation regarding patient arrangements for travel to MD appointment on 2/8 and follow up on how soon transportation request to be made prior to appointments.   1500 Return call to patient, he is agreeable to scheduling follow up appointment with Provider at [facility].With patient on phone placed  call to office reschedule appointment, patient able to repeat date time of appointment , office phone number states he has written down information.  Patient states that he will need transportation assistance to appointment . Discussed that I will follow up with [facility] service that arranged prior transportation  and contact him if follow up needed.   Reminded patient that he has refill on medications prescribed during his  December hospital ED visit they can be filled at his pharmacy ,  [facility], [address] that he states is closest pharmacy to his phone , this does not include insulin , he reports having insulin on hand. He is agreeable to getting prescription filled stating his housemate would be able to pick up, and assist with payment , reminded patient on copayment due with prescriptions, call to pharmacy.   Patient reports recent blood sugar check on yesterday was 91 before eating breakfast on yesterday, he denies signs symptoms of low blood sugar ,he denies having high reading of 300 range but does not check reading often.     Patient ending call stating he needs to follow up on phone meeting is was to have this am , He thinks it was related to his disability he is working on with person name Mark [name] at [facility].    Encounter Medications:  Outpatient Encounter Medications as of 12/01/2020  Medication Sig Note  . acetaminophen (TYLENOL) 325 MG tablet Take 2 tablets (650 mg total) by mouth every 6 (six) hours as needed for mild pain, fever or headache.   Marland Kitchen atorvastatin (LIPITOR) 20 MG tablet Take 1 tablet (20 mg total) by mouth daily. 11/03/2020: Never started.   . divalproex (DEPAKOTE) 250 MG DR tablet Take 3 tablets (750 mg total) by mouth every evening.   . divalproex (DEPAKOTE) 500 MG DR tablet Take 1 tablet (500 mg total) by mouth daily.   Marland Kitchen escitalopram (LEXAPRO) 20 MG tablet Take 1 tablet (20 mg total) by mouth daily. (Patient not taking: Reported on 11/03/2020)   . gabapentin (NEURONTIN) 100 MG capsule Take 1 capsule (100 mg total) by mouth 2 (two) times daily.  10/02/2020: New Rx  . insulin aspart protamine- aspart (NOVOLOG MIX 70/30) (70-30) 100 UNIT/ML injection Inject 0.35 mLs (35 Units total) into the skin 2 (two) times daily with a meal for 14 days.   Marland Kitchen levothyroxine (SYNTHROID) 50 MCG tablet Take 1 tablet (50 mcg total) by mouth daily at 6 (six) AM.   . pyridOXINE (B-6) 50 MG tablet Take 1 tablet (50 mg total) by mouth daily. 10/02/2020: New Rx   . QUEtiapine (SEROQUEL) 100 MG tablet Take 1 tablet (100 mg total) by mouth every morning. 11/03/2020: Need refills  . QUEtiapine (SEROQUEL) 400 MG tablet Take 1 tablet (400 mg total) by mouth at bedtime.   Marland Kitchen RIVAROXABAN (XARELTO) VTE STARTER PACK (15 & 20 MG) Follow package directions: Take one 15mg  tablet by mouth twice a day. On day 22, switch to one 20mg  tablet once a day. Take with food. (Patient taking differently: Take 15-20 mg by mouth as directed. Follow package directions: Take one 15mg  tablet by mouth twice a day. On day 22, switch to one 20mg  tablet once a day. Take with food.) 11/03/2020: Pt started medication regimen on 10/02/20 Taking 15 mg tablet BID for approximately 18 Days. Pt then stated he mistakenly started taking 20 mg tablet daily until dosage was completed. Pt stated he has approximately 6 or 8 tablets left of the 15 mg tablets. Patient stated that his last dose was the 20 mg tablet he took last week. Can't remember exact day/time of last dose.   . [DISCONTINUED] insulin aspart (NOVOLOG) 100 UNIT/ML injection Inject 10 Units into the skin 3 (three) times daily with meals. (Patient not taking: Reported on 03/23/2020)   . [DISCONTINUED] pregabalin (LYRICA) 150 MG capsule Take 1 capsule (150 mg total) by mouth 2 (two) times daily. (Patient not taking: Reported on 03/23/2020)    No facility-administered encounter medications on file as of 12/01/2020.    Functional Status:  In your present state of health, do you have any difficulty performing the following activities: 04/22/2020 04/10/2020  Hearing? N Y  Comment - -  Vision? N N  Difficulty concentrating or making decisions? N N  Walking or climbing stairs? Y N  Comment - -  Dressing or bathing? Y N  Doing errands, shopping? N N  Some encounter information is confidential and restricted. Go to Review Flowsheets activity to see all data.  Some recent data might be hidden    Fall/Depression Screening: No flowsheet data found. PHQ  2/9 Scores 12/01/2020  PHQ - 2 Score 2  PHQ- 9 Score 6    Assessment:  Goals Addressed            This Visit's Progress   . Make and Keep All Appointments   Not on track    Timeframe:  Long-Range Goal Priority:  High Start Date:   11/23/20                          Expected End Date:   02/16/21                    Follow Up Date 11/1420   - call to cancel if needed - keep a calendar with appointment dates  Reinforced keeping all appointments    Why is this important?    Part of staying healthy is seeing the doctor for follow-up care.   If you forget your appointments, there are some things you can do to stay on track.  Notes:  Assisted with making arrangement for establishing with PCP for medication review and ongoing medical care to limit ED visit .  2/10 no show at appointment rescheduled , and stressed importance of attending      . Manage My Medicine   Not on track    Timeframe:  Long-Range Goal Priority:  Medium Start Date:   11/23/20                          Expected End Date:   02/16/21                    Follow Up Date 12/10/19   - call for medicine refill 2 or 3 days before it runs out - keep a list of all the medicines I take; vitamins and herbals too  Encouraged taking medications as prescribed.    Why is this important?   . These steps will help you keep on track with your medicines.   Notes: Pharmacy referral placed for medication management, encouraged prioritizing taking medications, filling prescriptions  . Reviewed prescriptions available for refill, assist with notifying pharmacy     . Monitor and Manage My Blood Sugar-Diabetes Type 1   Not on track    Timeframe:  Long-Range Goal Priority:  High Start Date:   12/13/20                          Expected End Date:   02/16/21                    Follow Up Date 12/02/20   - check blood sugar if I feel it is too high or too low    Why is this important?    Checking your blood sugar at home helps to keep  it from getting very high or very low.   Writing the results in a diary or log helps the doctor know how to care for you.   Your blood sugar log should have the time, the date and the results.   Also, write down the amount of insulin or other medicine you take.   Other information like what you ate, exercise done and how you were feeling will also be helpful..     Notes: Reviewed high and low blood sugar signs symptoms and action plan.review of recent reading and discussed any symptoms        Plan:  Follow-up:  Patient agrees to Care Plan and Follow-up.  Patient agreeable to  call by Monday,verifying appointment and transportation arrangements in place. Email to [facility] Transportation  requesting transportation to newly scheduled provider appointment on Monday , February 14 at 3:10   [name], RN, BSN  [facility] Care Management,Care Management Coordinator  [phone]- Mobile [phone]- Toll Free Main Office

## 2020-12-02 ENCOUNTER — Other Ambulatory Visit: Payer: Self-pay | Admitting: *Deleted

## 2020-12-02 NOTE — Patient Outreach (Signed)
[facility]) Care Management  12/02/2020  JAMIERE GULAS 1985/11/03 [id number]   Multidisciplinary Case Discussion   Referral received: 11/22/20 Referral source: [facility] Referral reason : 2 or more ED visits in January   Admissions in last 6 months : 1, ED visits in last 6 months: 7  PMHX:  Diabetes type 1,insulin dependent, Right leg DVT, seizure disorder,hydoephalus ,anxiety, bipolar disorder, diabetic polyneuropathy.   Update provided to include  Medical Conditions Update: Reviewed patient recent history Medical summary related to ED frequency  visits related to diagnosis of hyperglycemia, need for assistance with medications including insulin.. Patient reports having borrowed device for monitoring blood sugar readings, and limits monitoring blood sugars.  .Discussed patient not having provider and attempting to arrange appointment for medical follow up, review of medications and need for refills on medications.     Follow up: Arrangements for transportation to appointment arranged. Will assess of pharmacy referral if  medication assistance needs identified related to cost . Will collaborate with provider regarding patient need diabetes monitoring supplies.  Will continue to assess for additional care coordination , resource needs, ongoing transportation needs.  Will follow up with  Multidisciplinary team as directed per patient ongoing needs.    [name], RN, BSN  [facility] Care Management,Care Management Coordinator  [phone]- Mobile [phone]- Toll Free Main Office

## 2020-12-05 ENCOUNTER — Other Ambulatory Visit: Payer: Self-pay

## 2020-12-05 ENCOUNTER — Other Ambulatory Visit: Payer: Self-pay | Admitting: *Deleted

## 2020-12-05 ENCOUNTER — Ambulatory Visit (INDEPENDENT_AMBULATORY_CARE_PROVIDER_SITE_OTHER): Payer: 59 | Admitting: Physician Assistant

## 2020-12-05 ENCOUNTER — Encounter: Payer: Self-pay | Admitting: Physician Assistant

## 2020-12-05 ENCOUNTER — Other Ambulatory Visit (HOSPITAL_COMMUNITY): Payer: Self-pay | Admitting: Physician Assistant

## 2020-12-05 VITALS — BP 129/82 | HR 81 | Temp 98.7°F | Resp 18 | Ht 75.0 in

## 2020-12-05 DIAGNOSIS — F314 Bipolar disorder, current episode depressed, severe, without psychotic features: Secondary | ICD-10-CM | POA: Diagnosis not present

## 2020-12-05 DIAGNOSIS — E1065 Type 1 diabetes mellitus with hyperglycemia: Secondary | ICD-10-CM | POA: Diagnosis not present

## 2020-12-05 DIAGNOSIS — R278 Other lack of coordination: Secondary | ICD-10-CM

## 2020-12-05 DIAGNOSIS — E1069 Type 1 diabetes mellitus with other specified complication: Secondary | ICD-10-CM

## 2020-12-05 DIAGNOSIS — F333 Major depressive disorder, recurrent, severe with psychotic symptoms: Secondary | ICD-10-CM

## 2020-12-05 DIAGNOSIS — Z86718 Personal history of other venous thrombosis and embolism: Secondary | ICD-10-CM

## 2020-12-05 LAB — GLUCOSE, POCT (MANUAL RESULT ENTRY): POC Glucose: 284 mg/dl — AB (ref 70–99)

## 2020-12-05 LAB — POCT GLYCOSYLATED HEMOGLOBIN (HGB A1C): Hemoglobin A1C: 8.3 % — AB (ref 4.0–5.6)

## 2020-12-05 MED ORDER — ESCITALOPRAM OXALATE 20 MG PO TABS
20.0000 mg | ORAL_TABLET | Freq: Every day | ORAL | 1 refills | Status: DC
Start: 2020-12-05 — End: 2021-02-24

## 2020-12-05 MED ORDER — QUETIAPINE FUMARATE 100 MG PO TABS
100.0000 mg | ORAL_TABLET | Freq: Every morning | ORAL | 6 refills | Status: DC
Start: 2020-12-05 — End: 2021-02-24

## 2020-12-05 MED ORDER — RIVAROXABAN (XARELTO) VTE STARTER PACK (15 & 20 MG): ORAL_TABLET | ORAL | 0 refills | Status: DC

## 2020-12-05 MED ORDER — QUETIAPINE FUMARATE 400 MG PO TABS
400.0000 mg | ORAL_TABLET | Freq: Every day | ORAL | 6 refills | Status: DC
Start: 2020-12-05 — End: 2021-02-24

## 2020-12-05 NOTE — Progress Notes (Signed)
New Patient Office Visit  Subjective:  Patient ID: Mark [name], male    DOB: Aug 03, 1986  Age: 35 y.o. MRN: [id number]  CC:  Chief Complaint  Patient presents with  . Hospitalization Follow-up    HPI [name] reports that he was seen at the emergency department on November 04, 2020 for hyperglycemia  Hospital note:   This is a 35 year old male with past medical history as above who presents emergency department for hyperglycemia in the setting of medication noncompliance and not being able to fill his medication secondary to finances.  Patient has had multiple emergency department visits over the last few weeks in regards to medication noncompliance and inability to obtain his medications.  He reports he does not have any test trips, but experiences some nausea and polyuria when his blood sugar is high which she was experiencing over the last few days.  Blood glucose was 381 on arrival, was rechecked once he was brought back to the room was 292.  On exam he is afebrile, hemodynamically stable, no acute distress, well-appearing.  He does not have any active vomiting and is not significantly dehydrated on my exam.  He is tolerating water without difficulty in the emergency department.  His pH is within normal limits, doubt diabetic ketoacidosis.  He was given 5 units of aspart insulin, tolerating p.o. with water and a sandwich in the emergency department.  Social work was engaged, there is no on-call social work Engineer, civil (consulting) at this time, but the consult order will remain and then they will contact the patient in the morning to follow-up in regards to get him his medications.  Multiple attempts have been made in the past to have this patient obtain his medications with financial assistance that he has not followed up with.  We discussed the importance of taking his medications in the long-term complications that can arise if he is not compliant, he understands this and states that he is  willing to follow-up in uses medications that social work will contact him for tomorrow.  States today that he is out of some medications, including the blood thinner, states he has been out of these for the past couple of weeks.  Reports that he never restarted the Lexapro due to the cost.  Reports that he has been compliant to his insulin injections, does not check blood glucose at home  Reports that he has been having difficulty sleeping has been out of the Seroquel.  Reports that he takes 100 mg in the morning, 400 mg at bedtime.  Reports that he continues to have pain in both legs while walking, but does state that it is progressively getting better.      Past Medical History:  Diagnosis Date  . Anxiety   . Bipolar disorder (HCC)   . Depression   . Diabetes mellitus without complication (HCC)   . Diabetic polyneuropathy associated with type 1 diabetes mellitus (HCC)   . Hydrocephalus (HCC)   . Kidney stones   . Seizures (HCC)     Past Surgical History:  Procedure Laterality Date  . KIDNEY STONE SURGERY      Family History  Problem Relation Age of Onset  . Breast cancer Mother     Social History   Socioeconomic History  . Marital status: Single    Spouse name: Not on file  . Number of children: Not on file  . Years of education: Not on file  . Highest education level: Not on file  Occupational History  . Not on file  Tobacco Use  . Smoking status: Current Every Day Smoker    Packs/day: 1.00    Years: 20.00    Pack years: 20.00    Types: Cigarettes  . Smokeless tobacco: Never Used  Vaping Use  . Vaping Use: Never used  Substance and Sexual Activity  . Alcohol use: Yes    Comment: occassional   . Drug use: Yes    Frequency: 7.0 times per week    Types: Marijuana    Comment: smoked 2-3 days ago, usually smokes daily  . Sexual activity: Not Currently  Other Topics Concern  . Not on file  Social History Narrative  . Not on file   Social Determinants  of Health   Financial Resource Strain: High Risk  . Difficulty of Paying Living Expenses: Very hard  Food Insecurity: Food Insecurity Present  . Worried About Programme researcher, broadcasting/film/video in the Last Year: Sometimes true  . Ran Out of Food in the Last Year: Sometimes true  Transportation Needs: Unmet Transportation Needs  . Lack of Transportation (Medical): Yes  . Lack of Transportation (Non-Medical): Yes  Physical Activity: Not on file  Stress: Not on file  Social Connections: Not on file  Intimate Partner Violence: Not on file    ROS Review of Systems  Constitutional: Negative.   HENT: Negative.   Eyes: Negative.   Respiratory: Negative.   Cardiovascular: Negative.   Gastrointestinal: Negative.   Endocrine: Negative.   Genitourinary: Negative.   Musculoskeletal: Positive for gait problem.  Skin: Negative.   Allergic/Immunologic: Negative.   Neurological: Negative for seizures, syncope, speech difficulty, weakness and headaches.  Hematological: Negative.   Psychiatric/Behavioral: Positive for sleep disturbance. Negative for self-injury and suicidal ideas.    Objective:   Today's Vitals: BP 129/82 (BP Location: Left Arm, Patient Position: Sitting, Cuff Size: Normal)   Pulse 81   Temp 98.7 F (37.1 C) (Oral)   Resp 18   Ht 6\' 3"  (1.905 m)   SpO2 97%   BMI 31.25 kg/m   Physical Exam Vitals and nursing note reviewed.  Constitutional:      General: He is not in acute distress.    Appearance: Normal appearance. He is not ill-appearing.     Comments: Disheveled appearance  HENT:     Head: Normocephalic and atraumatic.     Right Ear: External ear normal.     Left Ear: External ear normal.     Nose: Nose normal.     Mouth/Throat:     Mouth: Mucous membranes are moist.     Pharynx: Oropharynx is clear.  Eyes:     Extraocular Movements: Extraocular movements intact.     Conjunctiva/sclera: Conjunctivae normal.     Pupils: Pupils are equal, round, and reactive to light.   Cardiovascular:     Rate and Rhythm: Normal rate and regular rhythm.     Pulses: Normal pulses.     Heart sounds: Normal heart sounds.  Pulmonary:     Effort: Pulmonary effort is normal.     Breath sounds: Normal breath sounds.  Musculoskeletal:        General: No swelling.     Cervical back: Normal range of motion and neck supple.     Right lower leg: No edema.     Left lower leg: No edema.     Comments: Walking without assistance, unsteady gait  Skin:    General: Skin is warm and dry.  Neurological:  General: No focal deficit present.     Mental Status: He is alert and oriented to person, place, and time.  Psychiatric:        Mood and Affect: Mood normal.        Behavior: Behavior normal.        Thought Content: Thought content normal.        Judgment: Judgment normal.     Assessment & Plan:   Problem List Items Addressed This Visit      Endocrine   Type 1 diabetes mellitus with hyperosmolar hyperglycemic state (HHS) (HCC) - Primary   Relevant Medications   NOVOLIN 70/30 FLEXPEN (70-30) 100 UNIT/ML KwikPen   Other Relevant Orders   HgB A1c (Completed)   Glucose (CBG) (Completed)   Ambulatory referral to Endocrinology     Other   Severe recurrent major depression with psychotic features (HCC)   Relevant Medications   QUEtiapine (SEROQUEL) 100 MG tablet   QUEtiapine (SEROQUEL) 400 MG tablet   escitalopram (LEXAPRO) 20 MG tablet   Truncal ataxia   Relevant Orders   Ambulatory referral to Neurology   Bipolar I disorder, most recent episode depressed, severe without psychotic features (HCC)   Relevant Medications   QUEtiapine (SEROQUEL) 100 MG tablet   QUEtiapine (SEROQUEL) 400 MG tablet   Other Relevant Orders   Ambulatory referral to Psychiatry    Other Visit Diagnoses    History of DVT of lower extremity       Relevant Medications   RIVAROXABAN (XARELTO) VTE STARTER PACK (15 & 20 MG)      Outpatient Encounter Medications as of 12/05/2020  Medication  Sig  . acetaminophen (TYLENOL) 325 MG tablet Take 2 tablets (650 mg total) by mouth every 6 (six) hours as needed for mild pain, fever or headache.  Marland Kitchen atorvastatin (LIPITOR) 20 MG tablet Take 1 tablet (20 mg total) by mouth daily.  . divalproex (DEPAKOTE) 250 MG DR tablet Take 3 tablets (750 mg total) by mouth every evening.  . divalproex (DEPAKOTE) 500 MG DR tablet Take 1 tablet (500 mg total) by mouth daily.  Marland Kitchen escitalopram (LEXAPRO) 20 MG tablet Take 1 tablet (20 mg total) by mouth daily.  Marland Kitchen gabapentin (NEURONTIN) 100 MG capsule Take 1 capsule (100 mg total) by mouth 2 (two) times daily.  Marland Kitchen levothyroxine (SYNTHROID) 50 MCG tablet Take 1 tablet (50 mcg total) by mouth daily at 6 (six) AM.  . NOVOLIN 70/30 FLEXPEN (70-30) 100 UNIT/ML KwikPen SMARTSIG:35 Unit(s) SUB-Q Twice Daily  . pyridOXINE (B-6) 50 MG tablet Take 1 tablet (50 mg total) by mouth daily.  . QUEtiapine (SEROQUEL) 100 MG tablet Take 1 tablet (100 mg total) by mouth every morning.  Marland Kitchen QUEtiapine (SEROQUEL) 400 MG tablet Take 1 tablet (400 mg total) by mouth at bedtime.  Marland Kitchen RIVAROXABAN (XARELTO) VTE STARTER PACK (15 & 20 MG) Follow package directions: Take one  tablet by mouth twice a day. On day 22, switch to one  tablet once a day. Take with food.  . [DISCONTINUED] escitalopram (LEXAPRO) 20 MG tablet Take 1 tablet (20 mg total) by mouth daily. (Patient not taking: Reported on 11/03/2020)  . [DISCONTINUED] insulin aspart (NOVOLOG) 100 UNIT/ML injection Inject 10 Units into the skin 3 (three) times daily with meals. (Patient not taking: Reported on 03/23/2020)  . [DISCONTINUED] insulin aspart protamine- aspart (NOVOLOG MIX 70/30) (70-30) 100 UNIT/ML injection Inject 0.35 mLs (35 Units total) into the skin 2 (two) times daily with a meal for 14 days.  . [  DISCONTINUED] pregabalin (LYRICA) 150 MG capsule Take 1 capsule (150 mg total) by mouth 2 (two) times daily. (Patient not taking: Reported on 03/23/2020)  . [DISCONTINUED] QUEtiapine  (SEROQUEL) 100 MG tablet Take 1 tablet (100 mg total) by mouth every morning.  . [DISCONTINUED] QUEtiapine (SEROQUEL) 400 MG tablet Take 1 tablet (400 mg total) by mouth at bedtime.  . [DISCONTINUED] RIVAROXABAN (XARELTO) VTE STARTER PACK (15 & 20 MG) Follow package directions: Take one 15mg  tablet by mouth twice a day. On day 22, switch to one 20mg  tablet once a day. Take with food. (Patient taking differently: Take 15-20 mg by mouth as directed. Follow package directions: Take one 15mg  tablet by mouth twice a day. On day 22, switch to one 20mg  tablet once a day. Take with food.)   No facility-administered encounter medications on file as of 12/05/2020.  1. Type 1 diabetes mellitus with hyperosmolar hyperglycemic state (HHS) [facility]) Patient was previously hospitalized from November 12 through September 30, 2020.    Recommendations for Outpatient Follow-up:  1. Due to ambulatory dysfunction and inability to navigate stairs patient will discharge home by [facility] and up the stairs by stretcher 2. TOC arranging for wheelchair to be delivered to patient's home on 12/11 3. Unfortunately patient not eligible for any home health services because he has no funding including disability or Medicaid 4. Patient has longstanding psychiatric dysfunction which has worsened.  At this juncture I do not suspect patient would be able to maintain active employment 2/2 to his bipolar disorder.  It is our recommendation that patient be approved for permanent disability based on this condition. 5. If patient chooses to remain in the hospital through 12/11 neurology will reevaluate patient regarding his worsening ambulatory dysfunction.  I did discuss with Dr. October 02, 2020 who felt this was deconditioning related to critical illness and current symptoms and progress not unexpected 6. Patient and given an appointment with [facility] on 12/30 at 8:30 AM he will need follow-up TSH in  January. 7. Because of lack of funding prescriptions were provided by the [facility] pharmacy and he has 6 refills available on each prescription given.   Discharge Diagnoses:  Principal Problem:   AMS (altered mental status) Active Problems:   Diabetic keto-acidosis (HCC)   Obstructive hydrocephalus (HCC)   Bipolar I disorder, most recent episode depressed, severe without psychotic features (HCC)   Acute metabolic encephalopathy   Bipolar I disorder (HCC)   Uncontrolled type 1 diabetes mellitus with hyperglycemia (HCC)   Physical deconditioning   Ambulatory dysfunction   Insomnia due to mental disorder   Fever   Discharge Condition: Medically stable  Diet recommendation: Diabetic       Filed Weights   09/07/20 0500 09/08/20 0500 09/09/20 0453  Weight: 103 kg 102.2 kg 97.4 kg    History of present illness:  With history of diabetes type 1, anxiety/depression manifested as bipolar disorder, seizure disorder, hydrocephalus and tobacco use. He was sent to the ER with altered mental status with seizure like activity and found to be in severe DKA with encephalopathy that required intubation/ventilation from 11/12-11/14. Subsequent CT head demonstrated persistent marked hydrocephalus assistant with acute aqueductal stenosis. 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 Neurosurgery did not recommend any surgical intervention or placement of new drain. He was initially treated for aspiration pneumonia with IV Rocephin and vancomycin.  Subsequently patient has been transferred out of the ICU. PT/OT have recommended CIR but patient does not  have insurance. Medicaid application in process. SNF recommended for short-term rehab but patient has refused. He lives with a disabled friend and there are multiple stairs within the home. Based on current PT and OT evaluations patient is unable to discharge home independently based on increased mobility needs.  Because of concerns for capacity psychiatry was consulted. It was documented that his insight and judgment as continued to fluctuate throughout this hospitalization and thus was deemed lacking of capacity to make medical decisions.  He was not suicidal and had enough capacity to understand that if he fell at home or had another medical emergency he should call 911.He did not meet criteria for inpatient behavioral health treatment though. Plan was to focus on aggressive PT and OT and allow patient to discharge to home environment once physically stable.  Patient has become frustrated with this plan and is requesting to be discharged home  Hospital Course:  Acute problems: Severe physical deconditioning with associated ambulatory dysfunction -No focal neurological deficits only generalized weakness of lower extremities -Continues to require max assist with therapies and at this juncture SNF continues to be recommended by therapy providers. Patient has multiple stairs in the home he shares with a friend and would need to be able to navigate stairs before can safely discharge home. -PT/OT schedule 4x per week patient is dissatisfied with progress and feels he can do better at home and is insisting upon being discharged. -Please see late afternoon progress note on 12/10 dictated at 3:33 PM.  Patient continues to have issues with ambulatory dysfunctioning and severe deconditioning requesting to be discharged home with wheelchair.  This was discussed with his roommate who was agreeable.  Plan is to obtain wheelchair and discharge patient home via ambulance since he cannot navigate stairs to get into the home -Patient will need to call ambulance transport for future physician visits until he is able to navigate stairs at that time he can be transported by [facility] -Remain concerned with underlying peripheral neuropathy that we may have another condition besides critical illness related deconditioning requested  neurology evaluate patient prior to discharge.  Neurology will be unable to evaluate the patient until 12/11 and patient will be evaluated if he chooses to stay until 12/11.  Acute metabolic encephalopathy/Capacity concerns/bipolar disorder -Presented with acute encephalopathy likely related to DKA, hypoxia and aspiration pneumonitis -Continues to have waxing and waning issues regarding capacity per psychiatry team (reevaluated again on 12/3): documented that he lacks insight and is unable to link because of relationships. Evidence is based on his inability to weigh current physical limitations, remains limitations and need for physical therapy services. I suspect this is patient's chronic baseline from his ongoing bipolar disorder. Of note did not meet criteria for inpatient hospitalization -Will need outpatient APS follow-up; it appears patient had previously been enrolled in Medicaid but has been unable to consistently complete required follow-up paperwork to remain enrolled and will need assistance with this in the future -Continue Seroquel, Depakote and Lexapro (max dose) Seroquel from increased 300 to 400 mg at bedtime in this admission. Seroquel during the day added this admission as well and prior to discharge dose was increased to 100 mg.  Uncontrolled diabetes type 1 with hyperglycemia and presentation with DKA, hypoglycemic episodes since admission -Brittle diabetes-Hemoglobin A1c 8.2 -Patient will resume 70/30 insulin 35 units twice daily as prior to admission  Chronic hydrocephalus/?history of seizure disorder -Persistent changes of hydrocephalus involving the third and lateral ventricles with normal fourth ventricle assistant with aqueductal  stenosis -Neurosurgery evaluated during admission and no indications for surgical intervention -There was also a question of possible seizure-like activity at presentation but was evaluated by neurology earlier in the hospitalization who  determined he was not actively seizing -Continue Depakote  Dysphagia -Stable on D3 diet and at this juncture okay to resume regular texture diabetes diet upon discharge  Nutrition Status: Nutrition Problem: Inadequate oral intake-RESOLVED-now eating 100% of meals Etiology: inability to eat-resolved Signs/Symptoms: NPO status-now on D3 diet with Glucerna supplements, oral vitamin B6 replacement Interventions: Refer to RD note for recommendations     Other problems: Electrolyte abnormalities (Low K/Mg/Phos) -Labs have been stable after patient given replacement -Primarily related to presentation with DKA  Dyslipidemia -Continue Lipitor after discharge  Hypothyroidism with elevated TSH -TSH 14.4 (was 18.4 in May) -Noncompliant suspected although could be taking Synthroid with food which will inhibit correct absorption -Continue Synthroid 50 mcg daily -Repeat TSH mid December 2021  Anemia of chronic disease -Hemoglobin has remained stable with last reading 11.8 on 11/21  Procedures:  Intubation/extubation  EEG  MBSS   Consultations: Neurology Neurosurgery PCCM Psychiatry   - HgB A1c - Glucose (CBG)  2. Severe recurrent major depression with psychotic features (HCC) Refer for medication mgmt - QUEtiapine (SEROQUEL) 100 MG tablet; Take 1 tablet (100 mg total) by mouth every morning.  Dispense: 30 tablet; Refill: 6 - QUEtiapine (SEROQUEL) 400 MG tablet; Take 1 tablet (400 mg total) by mouth at bedtime.  Dispense: 30 tablet; Refill: 6 - escitalopram (LEXAPRO) 20 MG tablet; Take 1 tablet (20 mg total) by mouth daily.  Dispense: 30 tablet; Refill: 1  3. Truncal ataxia Refer for neurology  4. Bipolar I disorder, most recent episode depressed, severe without psychotic features (HCC) Refer for medication mgm - QUEtiapine (SEROQUEL) 100 MG tablet; Take 1 tablet (100 mg total) by mouth every morning.  Dispense: 30 tablet; Refill: 6 - QUEtiapine (SEROQUEL)  400 MG tablet; Take 1 tablet (400 mg total) by mouth at bedtime.  Dispense: 30 tablet; Refill: 6  5. History of DVT of lower extremity Restart starter pack - RIVAROXABAN (XARELTO) VTE STARTER PACK (15 & 20 MG); Follow package directions: Take one 15mg  tablet by mouth twice a day. On day 22, switch to one 20mg  tablet once a day. Take with food.  Dispense: 51 each; Refill: 0   I have reviewed the patient's medical history (PMH, PSH, Social History, Family History, Medications, and allergies) , and have been updated if relevant. I spent 45 minutes reviewing chart and  face to face time with patient.     Encouraged patient to continue with appointments for assistance, including transportation and medication.  Patient given appointment to establish with [facility].  Follow-up: [facility]   Kasandra Knudsenari S Milia Warth, PA-C

## 2020-12-05 NOTE — Patient Instructions (Signed)
[facility],  Please let us know if there is anything else we can do for you  Roney Jaffe, PA-C Physician Assistant [facility] Mobile Medicine [email or web address]   Basics of Medicine Management Taking your medicines correctly is an important part of managing or preventing medical problems. Make sure you know what disease or condition your medicine is treating, and how and when to take it. If you do not take your medicine correctly, it may not work well and may cause unpleasant side effects, including serious health problems. What should I do when I am taking medicines?  Read all the labels and inserts that come with your medicines. Review the information often.  Talk with your pharmacist if you get a refill and notice a change in the size, color, or shape of your medicines.  Know the potential side effects for each medicine that you take.  Try to get all your medicines from the same pharmacy. The pharmacist will have all your information and will understand how your medicines will affect each other (interact).  Tell your health care provider about all your medicines, including over-the-counter medicines, vitamins, and herbal or dietary supplements. He or she will make sure that nothing will interact with any of your prescribed medicines.   How can I take my medicines safely?  Take medicines only as told by your health care provider. ? Do not take more of your medicine than instructed. ? Do not take anyone else's medicines. ? Do not share your medicines with others. ? Do not stop taking your medicines unless your health care provider tells you to do so. ? You may need to avoid alcohol or certain foods or liquids when taking certain medicines. Follow your health care provider's instructions.  Do not split, mash, or chew your medicines unless your health care provider tells you to do so.  Tell your health care provider if you have trouble swallowing your medicines.  For liquid medicine, use the dosing container that was provided. How should I organize my medicines? Know your medicines  Know what each of your medicines looks like. This includes size, color, and shape. Tell your health care provider if you are having trouble recognizing all the medicines that you are taking.  If you cannot tell your medicines apart because they look similar, keep them in original bottles.  If you cannot read the labels on the bottles, tell your pharmacist to put your medicines in containers with large print.  Review your medicines and your schedule with family members, a friend, or a caregiver. Use a pill organizer  Use a tool to organize your medicine schedule. Tools include a weekly pillbox, a written chart, a notebook, or a calendar.  Your tool should help you remember the following things about each medicine: ? The name of the medicine. ? The amount (dose) to take. ? The schedule. This is the day and time the medicine should be taken. ? The appearance. This includes color, shape, size, and stamp. ? How to take your medicines. This includes instructions to take them with food, without food, with fluids, or with other medicines.  Create reminders for taking your medicines. Use sticky notes, or alarms on your watch, mobile device, or phone calendar.  You may choose to use a more advanced management system. These systems have storage, alarms, and visual and audio prompts.  Some medicines can be taken on an "as-needed" basis. These include medicines for nausea or  pain. If you take an as-needed medicine, write down the name and dose, as well as the date and time that you took it.   How should I plan for travel?  Take your pillbox, medicines, and organization system with you when traveling.  Have your medicines refilled before you travel. This will ensure that you do not run out of your  medicines while you are away from home.  Always carry an updated list of your medicines with you. If there is an emergency, a first responder can quickly see what medicines you are taking.  Do not pack your medicines in checked luggage in case your luggage is lost or delayed.  If any of your medicines is considered a controlled substance, make sure you bring a letter from your health care provider with you. How should I store and discard my medicines? For safe storage:  Store medicines in a cool, dry area away from light, or as directed by your health care provider. Do not store medicines in the bathroom. Heat and humidity will affect them.  Do not store your medicines with other chemicals, or with medicines for pets or other household members.  Keep medicines away from children and pets. Do not leave them on counters or bedside tables. Store them in high cabinets or on high shelves. For safe disposal:  Check expiration dates regularly. Do not take expired medicines. Discard medicines that are older than the expiration date.  Learn a safe way to dispose of your medicines. You may: ? Use a local government, hospital, or pharmacy medicine-take-back program. ? Mix the medicines with inedible substances, put them in a sealed bag or empty container, and throw them in the trash. What should I remember?  Tell your health care provider if you: ? Experience side effects. ? Have new symptoms. ? Have other concerns about taking your medicines.  Review your medicines regularly with your health care provider. Other medicines, diet, medical conditions, weight changes, and daily habits can all affect how medicines work. Ask if you need to continue taking each medicine, and discuss how well each one is working.  Refill your medicines early to avoid running out of them.  In case of an accidental overdose, call your local Poison Control Center at 276-111-1396 or visit your local emergency department  immediately. This is important. Summary  Taking your medicines correctly is an important part of managing or preventing medical problems.  You need to make sure that you understand what you are taking a medicine for, as well as how and when you need to take it.  Know your medicines and use a pill organizer to help you take your medicines correctly.  In case of an accidental overdose, call your local Poison Control Center at [phone] or visit your local emergency department immediately. This is important. This information is not intended to replace advice given to you by your health care provider. Make sure you discuss any questions you have with your health care provider. Document Revised: 10/03/2017 Document Reviewed: 10/03/2017 Elsevier Patient Education  2021 [employer].

## 2020-12-05 NOTE — Progress Notes (Signed)
Patient has taken medication today. Patient ate 30 minutes prior to appointment. Patient complains of bilateral leg pain radiating up. Patient reports off balance gait since being released from the hospital. Pain is currently scaled at a 6.

## 2020-12-05 NOTE — Patient Outreach (Signed)
[facility]) Care Management  12/05/2020  QUANAH MAJKA 1985/12/14 [id number]  Care Coordination   Referral received: 11/22/20 Referral source: [employer] Referral reason : 2 or more ED visits in January   Admissions in last 6 months : 1, ED visits in last 6 months: 7   PMHX:  Diabetes type 1,insulin dependent, Right leg DVT, seizure disorder,hydoephalus ,anxiety, bipolar disorder, diabetic polyneuropathy.    Subjective: Successful outreach call to patient , he reports doing alright on today.  Call to patient to follow up on scheduled appointment at provider office. Patient able to state time of arrival of  transportation to appointment on today at 2:45 for appointment scheduled at 3:10 at [facility]. Reinforced with patient importance of being ready  for pick up time provided  as driver does not wait more than 5 minutes. He verifies understanding and states that his friend plans to accompany him to appointment. Also reviewed plan for transportation to pharmacy after medical office visit.   Plan Patient agreeable to follow up call in the next 4 business days.     [name], RN, BSN  [facility] Care Management,Care Management Coordinator  [phone]- Mobile [phone]- Toll Free Main Office

## 2020-12-07 ENCOUNTER — Other Ambulatory Visit: Payer: Self-pay | Admitting: *Deleted

## 2020-12-07 ENCOUNTER — Telehealth: Payer: Self-pay | Admitting: General Practice

## 2020-12-07 DIAGNOSIS — Z86718 Personal history of other venous thrombosis and embolism: Secondary | ICD-10-CM | POA: Insufficient documentation

## 2020-12-07 NOTE — Patient Outreach (Signed)
[facility]) Care Management  [facility] Care Manager  12/07/2020   Mark [name] 12-08-1985 [id number]    Telephone Assessment   Referral received: 11/22/20 Referral source: [employer] Referral reason : 2 or more ED visits in January   Admissions in last 6 months : 1, ED visits in last 6 months: 7   PMHX:  Diabetes type 1,insulin dependent, Right leg DVT, seizure disorder,hydoephalus ,anxiety, bipolar disorder, diabetic polyneuropathy.     Subjective:  Successful outreach call to patient, he reports doing okay on today. He discussed attending doctor appointment on Monday.  He reports understanding of prescriptions of some medications renewed. He discussed not having money to fill prescriptions. He discussed receiving a [facility] from [facility] regarding cost of prescriptions he reviewed cost of medications being $5 , $7, and states $10 specifically for seroquel. He reports having 1 and 1/2 vials of 70/30 insulin available with syringes, states that he received it from [place].   Discussed with patient need  to establish with primary care with primary care provider. Noted Maurene Capes, FNP recommended follow up with [facility] to establish , placed call to office earliest appointment in May. Patient states that  he is agreeable to follow up at [facility] care for ongoing care if available.  Placed call to office earliest appointment March 10 at 10:10 virtual.   Noted prescription for Xarelto starter pack and seroquel sent to [facility]. Placed call to pharmacy reports prescription for Xarelto on hold patient has recently been prescribed starter pack. Representative states that since patient has insurance they will not be able to do free fill on medications, medications would be at cash price.  Placed call to [facility] Primary care office, able to leave a message with representative regarding clarification on Xarelto prescription.     Encounter Medications:  Outpatient Encounter Medications as of 12/07/2020  Medication Sig Note  . acetaminophen (TYLENOL) 325 MG tablet Take 2 tablets (650 mg total) by mouth every 6 (six) hours as needed for mild pain, fever or headache.   Marland Kitchen atorvastatin (LIPITOR) 20 MG tablet Take 1 tablet (20 mg total) by mouth daily. 11/03/2020: Never started.   . divalproex (DEPAKOTE) 250 MG DR tablet Take 3 tablets (750 mg total) by mouth every evening.   . divalproex (DEPAKOTE) 500 MG DR tablet Take 1 tablet (500 mg total) by mouth daily.   Marland Kitchen escitalopram (LEXAPRO) 20 MG tablet Take 1 tablet (20 mg total) by mouth daily.   Marland Kitchen gabapentin (NEURONTIN) 100 MG capsule Take 1 capsule (100 mg total) by mouth 2 (two) times daily. 10/02/2020: New Rx  . levothyroxine (SYNTHROID) 50 MCG tablet Take 1 tablet (50 mcg total) by mouth daily at 6 (six) AM.   . NOVOLIN 70/30 FLEXPEN (70-30) 100 UNIT/ML KwikPen SMARTSIG:35 Unit(s) SUB-Q Twice Daily   . pyridOXINE (B-6) 50 MG tablet Take 1 tablet (50 mg total) by mouth daily. 10/02/2020: New Rx  . QUEtiapine (SEROQUEL) 100 MG tablet Take 1 tablet (100 mg total) by mouth every morning.   Marland Kitchen QUEtiapine (SEROQUEL) 400 MG tablet Take 1 tablet (400 mg total) by mouth at bedtime.   Marland Kitchen RIVAROXABAN (XARELTO) VTE STARTER PACK (15 & 20 MG) Follow package directions: Take one 15mg  tablet by mouth twice a day. On day 22, switch to one 20mg  tablet once a day. Take with food.   . [DISCONTINUED] insulin aspart (NOVOLOG) 100 UNIT/ML injection Inject 10 Units into the skin 3 (three) times  daily with meals. (Patient not taking: Reported on 03/23/2020)   . [DISCONTINUED] pregabalin (LYRICA) 150 MG capsule Take 1 capsule (150 mg total) by mouth 2 (two) times daily. (Patient not taking: Reported on 03/23/2020)    No facility-administered encounter medications on file as of 12/07/2020.    Functional Status:  In your present state of health, do you have any difficulty performing the following  activities: 04/22/2020 04/10/2020  Hearing? N Y  Comment - -  Vision? N N  Difficulty concentrating or making decisions? N N  Walking or climbing stairs? Y N  Comment - -  Dressing or bathing? Y N  Doing errands, shopping? N N  Some encounter information is confidential and restricted. Go to Review Flowsheets activity to see all data.  Some recent data might be hidden    Fall/Depression Screening: Fall Risk  12/05/2020  Falls in the past year? 1  Number falls in past yr: 1  Injury with Fall? 1  Risk for fall due to : Impaired balance/gait;Impaired mobility;History of fall(s)  Follow up Falls evaluation completed   PHQ 2/9 Scores 12/05/2020 12/01/2020  PHQ - 2 Score 2 2  PHQ- 9 Score 6 6    Assessment:  Goals Addressed            This Visit's Progress   . Make and Keep All Appointments   On track    Timeframe:  Long-Range Goal Priority:  High Start Date:   11/23/20                          Expected End Date:   02/16/21                    Follow Up Date 12/19/20   - call to cancel if needed - keep a calendar with appointment dates  Reinforced keeping all appointments    Why is this important?    Part of staying healthy is seeing the doctor for follow-up care.   If you forget your appointments, there are some things y. you can do to stay on track.    Notes:  Commended on keeping post ED visit with provider, assisted with appointment for establishing care , reviewed referrals to neurology, endocrinology and psychology pending .      Marland Kitchen Manage My Medicine   Not on track    Timeframe:  Long-Range Goal Priority:  Medium Start Date:   11/23/20                          Expected End Date:   02/16/21                    Follow Up Date 12/19/20   - call for medicine refill 2 or 3 days before it runs out - keep a list of all the medicines I take; vitamins and herbals too  Encouraged taking medications as prescribed.    Why is this important?   . These steps will help you keep  on track with your medicines.   Notes: Discussed importance of taking medications, reviewed follow up on addressing resources to assist such as pharmacy, transportation.Discussed making adjustment in expenses in home such as cigarettes to be able to afford medications. Verified having insulin and syringe supply on hand     . Monitor and Manage My Blood Sugar-Diabetes Type 1   Not on track  Timeframe:  Long-Range Goal Priority:  High Start Date:   12/13/20                          Expected End Date:   02/16/21                    Follow Up Date 12/19/20   - check blood sugar if I feel it is too high or too low    Why is this important?    Checking your blood sugar at home helps to keep it from getting very high or very low.   Writing the results in a diary or log helps the doctor know how to care for you.   Your blood sugar log should have the time, the date and the results.   Also, write down the amount of insulin or other medicine you take.   Other information like what you ate, exercise done and how you were feeling will also be helpful..     Notes: Reviewed high and low blood sugar signs symptoms and action plan.review of recent reading and discussed any symptoms     . Set My Target A1C   Not on track    Timeframe:  Short-Term Goal Priority:  Medium Start Date:   12/13/20                          Expected End Date:  01/18/21                     Follow Up Date 12/09/20    - set target A1C    Why is this important?    Your target A1C is decided together by you and your doctor.   It is based on several things like your age and other health issues.    Notes:  Reviewed control A1c goal of 7, reviewed patient current A1c 7.5.         Plan:  Follow-up:  Patient agrees to Care Plan and Follow-up. Scheduled follow up call within   the next 2  business days  or sooner to address new concerns as return call from provider office received.  Will discuss with [facility] care  management assistant director regarding need for possible  referral to pharmacist regarding medication cost concerns patient appointment with PCP to establish care.  Care Coordination: Calls to [facility] health pharmacy to determine cost to xarelto, they are unable to determine cost of prescriptions unless they have prescription on file to run. Pharmacist suggested calling [employer] insurance plan.  [facility] [address] [facility] to  determine cost of prescriptions refills on file representative states cost being $24.14. Attempted return call to patient to discuss total price and identify if he will be able to pay, call unsuccessful.  Received incoming call from [place], CMA from [facility] care at [facility], she was following up regarding question related to patient prescription for starter pack of Xarelto, she discussed provider clinical decision to order starter pack as patient has been out of medication for weeks. Discussed with [place], since patient has insurance I was informed by  wellness pharmacy that they will not be able to fill free prescription for patient as he has [employer] insurance and patient having difficulty affording copayment [facility] for $[facility],14.  She [facility] , provider considering change in therapy to Eliquis that has free trial card and patient assistance, this is end of  business day,  will plan follow up on tomorrow am .     Egbert GaribaldiKimberly Preciosa Bundrick, RN, BSN  [facility] Care Management,Care Management Coordinator  [phone]- Mobile [phone]- Toll Free Main Office

## 2020-12-07 NOTE — Telephone Encounter (Signed)
RIVAROXABAN (XARELTO) VTE STARTER PACK (15 & 20 MG) Kim needs to speak with Dr. Tomi Bamberger regarding this medication for Mr. Bologna. Please advise and thank you

## 2020-12-08 ENCOUNTER — Telehealth: Payer: Self-pay

## 2020-12-08 ENCOUNTER — Other Ambulatory Visit: Payer: Self-pay | Admitting: *Deleted

## 2020-12-08 ENCOUNTER — Inpatient Hospital Stay (INDEPENDENT_AMBULATORY_CARE_PROVIDER_SITE_OTHER): Payer: 59 | Admitting: Primary Care

## 2020-12-08 MED FILL — QUETIAPINE FUMARATE 400 MG: 400 | 30 days supply | Qty: 30 | Fill #0

## 2020-12-08 MED FILL — XARELTO STARTER PACK: 15 & 20 | 30 days supply | Qty: 51 | Fill #0

## 2020-12-08 NOTE — Patient Outreach (Signed)
[facility]) Care Management  12/08/2020  Mark [name] 12-27-1985 [id number]   Care Coordination   Subjective Missed incoming call from patient, return call to patient no answer able to leave a HIPAA compliant voice mail message.   Received team message from [facility] this am that included Mardee Postin, pharmacy regarding attempting to run Xarelto prescription with copay card at [facility] and will  notify patient with price.  Per epic note they have left voicemail message for patient regarding medication being ready for him at $10.   1515 Return call to patient, he discussed receiving call cal copay for Xarelto being $10, he again states that he can't afford that or copayment on [facility]. Patient discussed speaking with someone at [name] center that is helping him with his medicaid application. He states being happy in  receiving his food stamps on today. He states that his roommate is currently admitted in hospital.   Placed call to [facility] pharmacy regarding possibility of transferring [facility] pharmacy to have all medications same place in order explore being able to provide assistance with copayment on prescriptions at one place.  [facility] agreeable to making contact regarding  transferring prescriptions to [facility] pharmacywhere Xarelto is being filled also if needed.  [facility] to verify patient has not picked up prescriptions , spoke with pharmacy representative that verifies prescription requested for refill on 2/10 has not been picked up, requested they be returned to stock.  Returned call to [place] at [employer] patient [facility] being  [facility]red to [facility]. In reviewing she report prescriptions haven't been return  to stock for prescriptions to be transferred. She states that she will continue to follow provided my  contact number for follow up on next business day.   Plan Will plan follow up call to patient in the next business day regarding medication .     [name], RN, BSN  [facility] Care Management,Care Management Coordinator  [phone]- Mobile [phone]- Toll Free Main Office

## 2020-12-08 NOTE — Telephone Encounter (Signed)
Left voicemail advising patient that [name] has medication ready for him at $10(Xarelto).  Medication was run thru his [employer] and a Xarelto copay card.  Advised that if he was needing the price of the other 2 medications on file/or filled to contact the Pharmacy at [phone]

## 2020-12-08 NOTE — Telephone Encounter (Signed)
Thank you :)

## 2020-12-09 ENCOUNTER — Other Ambulatory Visit: Payer: Self-pay | Admitting: *Deleted

## 2020-12-09 MED FILL — QUETIAPINE FUMARATE 100 MG: 100 | 30 days supply | Qty: 30 | Fill #0

## 2020-12-09 MED FILL — ESCITALOPRAM 20 MG TABLET: 20 | 30 days supply | Qty: 30 | Fill #0

## 2020-12-09 MED FILL — LEVOTHYROXINE 50 MCG TABLET: 50 | 30 days supply | Qty: 30 | Fill #0

## 2020-12-09 MED FILL — DIVALPROEX SOD DR 500 MG TA: 500 | 30 days supply | Qty: 30 | Fill #0

## 2020-12-09 MED FILL — ATORVASTATIN CALCIUM 20 MG: 20 | 30 days supply | Qty: 30 | Fill #0

## 2020-12-09 MED FILL — DIVALPROEX SOD DR 250 MG TA: 250 | 30 days supply | Qty: 90 | Fill #0

## 2020-12-09 MED FILL — GABAPENTIN 100 MG CAPSULE: 100 | 30 days supply | Qty: 60 | Fill #0

## 2020-12-09 NOTE — Patient Outreach (Signed)
[facility]) Care Management  12/09/2020  Mark [name] [birth year] [id number]   Care Coordination   Follow up call from [facility] pharmacy regarding patient [facility], she reports a total of 9 prescriptions that includes Xarelto are currently available at pharmacy. She report total of prescriptions $40.78.   Call to [employer] [facility] care management Director to collaborate regarding patient situation lack of financial support for getting prescriptions filled.  Will complete giving committee form to present.   Placed call to patient to obtain additional information to complete giving committee form  no answer, able to leave a HIPAA compliant message for return call.  Placed call to [facility] able to speak with [facility] as patient has mentioned is assisting with application for Disability, she states that she has been following patient since May of 2021.   She discussed patient missed some initial deadlines so application submitted again in January, she states that it can takes 6 to 8 months. She states they are working to get him a different type phone as he misses some call with type "text' phone he has.  She states that they have assisted Cara with getting food stamps. She does not believe that he receives any type income or check, unsure of how paying for insurance plan, unaware that he had [employer] .  She states patient had worked with [facility] [place] [facility] , on [place] Application ? April 2021 she is unsure where patient is in that process, She provided contact number for Olam Idler, [phone], able to leave a message for return call.    Plan Will plan follow up call to patient in the next business day, to provide additional information for completing Giving committee form and continue ongoing  assessment of care management and  resources needs.    [name], RN, BSN  [facility] Care Management,Care Management Coordinator  [phone]- Mobile [phone]- Toll Free Main Office

## 2020-12-09 NOTE — Patient Outreach (Signed)
[facility]) Care Management  12/09/2020  Mark [name] 1986/02/14 [id number]   This encounter was created in error - please disregard.   [name], RN, BSN  [facility] Care Management,Care Management Coordinator  [phone]- Mobile [phone]- Toll Free Main Office

## 2020-12-12 ENCOUNTER — Other Ambulatory Visit: Payer: Self-pay | Admitting: *Deleted

## 2020-12-12 NOTE — Telephone Encounter (Signed)
MA has been in contact with [facility] rep, [facility] pharmacy and provider for an alternative for patient due to financial strain.

## 2020-12-12 NOTE — Patient Outreach (Signed)
[facility]) Care Management  12/12/2020  Mark [name] January 15, 1986 [id number]   Telephone Outreach Care Coordination     Subjective: Unsuccessful outreach call to patient, received message that this text message subscriber is unable to take call at this time leave a message.  I was able to leave a HIPAA compliant voicemail message for return call.    Plan Will plan return call in the next 3 to 4 business days.    [name], RN, BSN  [facility] Care Management,Care Management Coordinator  [phone]- Mobile [phone]- Toll Free Main Office

## 2020-12-15 ENCOUNTER — Other Ambulatory Visit: Payer: Self-pay | Admitting: *Deleted

## 2020-12-15 NOTE — Patient Outreach (Signed)
[facility]) Care Management  [facility] Care Manager  12/15/2020   BEULAH MATUSEK Jan 05, 1986 [id number]     Telephone Outreach Care Coordination    Subjective: Unsuccessful outreach call to patient, received message that this text message subscriber is unable to take call at this time leave a message.  I was able to leave a HIPAA compliant voicemail message for return call.    Care Coordination 12/14/22 Outreach call to [place] at [facility] regarding patient status with filing for Medicaid. Discussed outreach call to 1st source at [facility] representative Christia Reading states that attempted to make contact with patient regarding filing for medicaid unable maintain contact, patient not listed a medicaid pending.  Received return call from [facility] at [facility] after speaking with her supervisor and Christia Reading at [facility] 1st source, [place] at [facility] will be able to assist patient with Medicaid application.   Subjective:  Return call from patient. He states that he is doing okay. He states that he continues to have supply of insulin on hand and taking as prescribed. He states checking blood sugars sometimes and on occasion reading of 300.He denies having low blood sugar reading , verifies he has food supply on hand with use of food stamps.  Discussed with patient provider office coordinating being able to get Xarelto prescription for copayment cost of $10 at [facility]. Discussed with patient that remainder of prescriptions currently at [facility] as well to make it easier for him to pick up initially . He reports that he will not be able to pick up prescriptions until 3/3, he states that he or his housemate [name] would be able to take bus to [facility] outpatient pharmacy for prescription pick up, explained to patient copayment for all prescriptions total is $40.78.  Discussed with patient [facility] will  be assisting him with Medicaid application .   Objective:   Encounter Medications:  Outpatient Encounter Medications as of 12/15/2020  Medication Sig Note  . NOVOLIN 70/30 FLEXPEN (70-30) 100 UNIT/ML KwikPen SMARTSIG:35 Unit(s) SUB-Q Twice Daily   . acetaminophen (TYLENOL) 325 MG tablet Take 2 tablets (650 mg total) by mouth every 6 (six) hours as needed for mild pain, fever or headache.   Marland Kitchen atorvastatin (LIPITOR) 20 MG tablet Take 1 tablet (20 mg total) by mouth daily. (Patient not taking: Reported on 12/15/2020) 12/15/2020: [facility] refill pick   . divalproex (DEPAKOTE) 250 MG DR tablet Take 3 tablets (750 mg total) by mouth every evening. (Patient not taking: Reported on 12/15/2020) 12/15/2020: Awaiting refill pick up   . divalproex (DEPAKOTE) 500 MG DR tablet Take 1 tablet (500 mg total) by mouth daily. (Patient not taking: Reported on 12/15/2020) 12/15/2020: Awaiting refill pick up   . escitalopram (LEXAPRO) 20 MG tablet Take 1 tablet (20 mg total) by mouth daily. (Patient not taking: Reported on 12/15/2020) 12/15/2020: Awaiting refill pick up   . gabapentin (NEURONTIN) 100 MG capsule Take 1 capsule (100 mg total) by mouth 2 (two) times daily. (Patient not taking: Reported on 12/15/2020) 12/15/2020: Awaiting refill pick up   . levothyroxine (SYNTHROID) 50 MCG tablet Take 1 tablet (50 mcg total) by mouth daily at 6 (six) AM. (Patient not taking: Reported on 12/15/2020)   . pyridOXINE (B-6) 50 MG tablet Take 1 tablet (50 mg total) by mouth daily. (Patient not taking: Reported on 12/15/2020) 10/02/2020: New Rx  . QUEtiapine (SEROQUEL) 100 MG tablet Take 1 tablet (100 mg  total) by mouth every morning. (Patient not taking: Reported on 12/15/2020) 12/15/2020: [facility] refill pickup   . QUEtiapine (SEROQUEL) 400 MG tablet Take 1 tablet (400 mg total) by mouth at bedtime. (Patient not taking: Reported on 12/15/2020) 12/15/2020: Awaiting refill pick up   . RIVAROXABAN (XARELTO) VTE STARTER PACK (15 & 20 MG) Follow  package directions: Take one 15mg  tablet by mouth twice a day. On day 22, switch to one 20mg  tablet once a day. Take with food. (Patient not taking: Reported on 12/15/2020) 12/15/2020: Awaiting prescription pick up   . [DISCONTINUED] insulin aspart (NOVOLOG) 100 UNIT/ML injection Inject 10 Units into the skin 3 (three) times daily with meals. (Patient not taking: Reported on 03/23/2020)   . [DISCONTINUED] pregabalin (LYRICA) 150 MG capsule Take 1 capsule (150 mg total) by mouth 2 (two) times daily. (Patient not taking: Reported on 03/23/2020)    No facility-administered encounter medications on file as of 12/15/2020.    Functional Status:  In your present state of health, do you have any difficulty performing the following activities: 04/22/2020 04/10/2020  Hearing? N Y  Comment - -  Vision? N N  Difficulty concentrating or making decisions? N N  Walking or climbing stairs? Y N  Comment - -  Dressing or bathing? Y N  Doing errands, shopping? N N  Some encounter information is confidential and restricted. Go to Review Flowsheets activity to see all data.  Some recent data might be hidden    Fall/Depression Screening: Fall Risk  12/05/2020  Falls in the past year? 1  Number falls in past yr: 1  Injury with Fall? 1  Risk for fall due to : Impaired balance/gait;Impaired mobility;History of fall(s)  Follow up Falls evaluation completed   PHQ 2/9 Scores 12/05/2020 12/01/2020  PHQ - 2 Score 2 2  PHQ- 9 Score 6 6    Assessment:  Goals Addressed            This Visit's Progress   . Make and Keep All Appointments       Timeframe:  Long-Range Goal Priority:  High Start Date:   11/23/20                          Expected End Date:   02/16/21                    Follow Up Date 12/19/20   - call to cancel if needed - keep a calendar with appointment dates  Reinforced keeping all appointments    Why is this important?    Part of staying healthy is seeing the doctor for follow-up care.   If you  forget your appointments, there are some things y. you can do to stay on track.    Notes:  Reviewed upcoming appointment       . Manage My Medicine   Not on track    Timeframe:  Long-Range Goal Priority:  Medium Start Date:   11/23/20                          Expected End Date:   02/16/21                    Follow Up Date 12/19/20   - call for medicine refill 2 or 3 days before it runs out - keep a list of all the medicines I take; vitamins and herbals too  Encouraged taking medications as prescribed.    Why is this important?   . These steps will help you keep on track with your medicines.   Notes: Reviewed importance of taking medications , discussed picking up prescriptions and  cost at [facility] long pharmacy reviewed cost, and ability to afford , he agrees to pick on 3/3.    Marland Kitchen Monitor and Manage My Blood Sugar-Diabetes Type 1   Not on track    Timeframe:  Long-Range Goal Priority:  High Start Date:   12/13/20                          Expected End Date:   02/16/21                    Follow Up Date 12/19/20   - check blood sugar if I feel it is too high or too low reinforced    Why is this important?    Checking your blood sugar at home helps to keep it from getting very high or very low.   Writing the results in a diary or log helps the doctor know how to care for you.   Your blood sugar log should have the time, the date and the results.   Also, write down the amount of insulin or other medicine you take.   Other information like what you ate, exercise done and how you were feeling will also be helpful..     Notes: Reviewed high and low blood sugar signs symptoms and action plan.review of recent reading and discussed any symptoms .Discussed recent blood sugar reading     . Set My Target A1C   Not on track    Timeframe:  Short-Term Goal Priority:  Medium Start Date:   12/13/20                          Expected End Date:  01/18/21                     Follow Up Date :  12/31/20   - set target A1C    Why is this important?    Your target A1C is decided together by you and your doctor.   It is based on several things like your age and other health issues.    Notes:  Reviewed control A1c goal of 7 .         Plan:  Follow-up:  Patient agrees to Care Plan and Follow-up. Patient agreeable to return call in next week regarding notifying [facility] regarding medication refill for pick on 3/3  Will send initial visit note when established with PCP, will review medication list when patient has medications on hand, only medication patient reports having is 70/30 insulin.     [name], RN, BSN  [facility] Care Management,Care Management Coordinator  [phone]- Mobile [phone]- Toll Free Main Office

## 2020-12-19 ENCOUNTER — Inpatient Hospital Stay: Payer: 59 | Admitting: Internal Medicine

## 2020-12-21 ENCOUNTER — Other Ambulatory Visit: Payer: Self-pay | Admitting: *Deleted

## 2020-12-21 NOTE — Patient Outreach (Addendum)
[facility]) Care Management  12/21/2020  Mark [name] [birth year] [id number]   Care Coordination   Telephone Assessment  Referral received: 11/22/20 Referral source: [facility] Referral reason : 2 or more ED visits in January PMHX:  Diabetes type 1,insulin dependent, Right leg DVT, seizure disorder,hydoephalus ,anxiety, bipolar disorder, diabetic polyneuropathy.  Subjective: Unsuccessful follow up call to patient, no answer phone message states text mail subscriber unable to take call at this time. I was able to leave a voicemail message for return call.   Assessment  Difficult maintaining contact with patient for follow on patient care  Needs,such as prescriptions. Placed call to [facility] outpatient pharmacy to determine if patient prescriptions had been picked up representative verified prescriptions have not been picked up yet, he agreed that he would have finances  to pick up by 3.3.   Noted telephone visit on next week to establish with PCP. Marland Kitchen   Plan If no return call from patient will reschedule call in the next week.  Will send this visit note to Maurene Capes , NP that placed refills on medication at office post hospital  discharge visit   [name], RN, BSN  [facility] Care Management,Care Management Coordinator  [phone]- Mobile [phone]- Toll Free Main Office

## 2020-12-26 MED FILL — XARELTO STARTER PACK: 15 & 20 | 30 days supply | Qty: 51 | Fill #0

## 2020-12-26 MED FILL — QUETIAPINE FUMARATE 400 MG: 400 | 30 days supply | Qty: 30 | Fill #0

## 2020-12-26 MED FILL — QUETIAPINE FUMARATE 100 MG: 100 | 30 days supply | Qty: 30 | Fill #0

## 2020-12-28 ENCOUNTER — Other Ambulatory Visit: Payer: Self-pay | Admitting: *Deleted

## 2020-12-28 DIAGNOSIS — E1065 Type 1 diabetes mellitus with hyperglycemia: Secondary | ICD-10-CM

## 2020-12-28 DIAGNOSIS — IMO0002 Reserved for concepts with insufficient information to code with codable children: Secondary | ICD-10-CM

## 2020-12-28 NOTE — Patient Outreach (Signed)
[facility]) Care Management  [facility] Care Manager  12/28/2020   Mark [name] 1986-10-11 [id number]   Care Coordination  Telephone assessment   Referral received: 11/22/20 Referral source: [facility] Referral reason : 2 or more ED visits in January   Admissions in last 6 months : 1, ED visits in last 6 months: 7  PMHX:  Diabetes type 1,insulin dependent, Right leg DVT, seizure disorder,hydoephalus ,anxiety, bipolar disorder, diabetic polyneuropathy.     Subjective:  Successful outreach call to patient , he reports that he is doing okay.  He reports that he continues to take 70/30 insulin 2-3  times daily, he states that he has received recent supply from [place] . He now states that he does not have meter, supplies and strips to monitor readings a home.  Patient discussed recent fall at home, no injury except skin scrapes.  Discussed with patient prescriptions for pick up at [facility], he states that his friend/roommate Mark [name] will pick medications up. He placed [name] on speaker phone he plans to pick up prescriptions today or tomorrow at [employer] [facility], provided address, contact phone number and copay cost of $40.78 at last call, Mark [name] reports that he will be able to purchase medications.  Discussed with patient call I received from [place] at [facility] , that has been working with patient on Disability application  will now work on [employer] application but she has been having difficulty making contact with patient. [name] requested that I call servant center to leave message for [facility] with his new cell phone number.   Objective:   Encounter Medications:  Outpatient Encounter Medications as of 12/28/2020  Medication Sig Note  . acetaminophen (TYLENOL) 325 MG tablet Take 2 tablets (650 mg total) by mouth every 6 (six) hours as needed for mild pain, fever or headache.   Marland Kitchen atorvastatin (LIPITOR) 20 MG tablet Take 1 tablet (20 mg  total) by mouth daily. (Patient not taking: Reported on 12/15/2020) 12/15/2020: [facility] refill pick   . divalproex (DEPAKOTE) 250 MG DR tablet Take 3 tablets (750 mg total) by mouth every evening. (Patient not taking: Reported on 12/15/2020) 12/15/2020: Awaiting refill pick up   . divalproex (DEPAKOTE) 500 MG DR tablet Take 1 tablet (500 mg total) by mouth daily. (Patient not taking: Reported on 12/15/2020) 12/15/2020: Awaiting refill pick up   . escitalopram (LEXAPRO) 20 MG tablet Take 1 tablet (20 mg total) by mouth daily. (Patient not taking: Reported on 12/15/2020) 12/15/2020: Awaiting refill pick up   . gabapentin (NEURONTIN) 100 MG capsule Take 1 capsule (100 mg total) by mouth 2 (two) times daily. (Patient not taking: Reported on 12/15/2020) 12/15/2020: Awaiting refill pick up   . levothyroxine (SYNTHROID) 50 MCG tablet Take 1 tablet (50 mcg total) by mouth daily at 6 (six) AM. (Patient not taking: Reported on 12/15/2020)   . NOVOLIN 70/30 FLEXPEN (70-30) 100 UNIT/ML KwikPen SMARTSIG:35 Unit(s) SUB-Q Twice Daily   . pyridOXINE (B-6) 50 MG tablet Take 1 tablet (50 mg total) by mouth daily. (Patient not taking: Reported on 12/15/2020) 10/02/2020: New Rx  . QUEtiapine (SEROQUEL) 100 MG tablet Take 1 tablet (100 mg total) by mouth every morning. (Patient not taking: Reported on 12/15/2020) 12/15/2020: [facility] refill pickup   . QUEtiapine (SEROQUEL) 400 MG tablet Take 1 tablet (400 mg total) by mouth at bedtime. (Patient not taking: Reported on 12/15/2020) 12/15/2020: Awaiting refill pick up   . RIVAROXABAN (XARELTO) VTE STARTER PACK (15 & 20 MG)  Follow package directions: Take one 15mg  tablet by mouth twice a day. On day 22, switch to one 20mg  tablet once a day. Take with food. (Patient not taking: Reported on 12/15/2020) 12/15/2020: Awaiting prescription pick up   . [DISCONTINUED] insulin aspart (NOVOLOG) 100 UNIT/ML injection Inject 10 Units into the skin 3 (three) times daily with meals. (Patient not taking: Reported  on 03/23/2020)   . [DISCONTINUED] pregabalin (LYRICA) 150 MG capsule Take 1 capsule (150 mg total) by mouth 2 (two) times daily. (Patient not taking: Reported on 03/23/2020)    No facility-administered encounter medications on file as of 12/28/2020.    Functional Status:  In your present state of health, do you have any difficulty performing the following activities: 04/22/2020 04/10/2020  Hearing? N Y  Comment - -  Vision? N N  Difficulty concentrating or making decisions? N N  Walking or climbing stairs? Y N  Comment - -  Dressing or bathing? Y N  Doing errands, shopping? N N  Some encounter information is confidential and restricted. Go to Review Flowsheets activity to see all data.  Some recent data might be hidden    Fall/Depression Screening: Fall Risk  12/05/2020  Falls in the past year? 1  Number falls in past yr: 1  Injury with Fall? 1  Risk for fall due to : Impaired balance/gait;Impaired mobility;History of fall(s)  Follow up Falls evaluation completed   PHQ 2/9 Scores 12/05/2020 12/01/2020  PHQ - 2 Score 2 2  PHQ- 9 Score 6 6    Assessment:  Goals Addressed            This Visit's Progress   . Make and Keep All Appointments   On track    Timeframe:  Long-Range Goal Priority:  High Start Date:   11/23/20                          Expected End Date:   02/16/21                    Follow Up Date: 01/19/21    - call to cancel if needed - keep a calendar with appointment dates  Reinforced keeping all appointments    Why is this important?    Part of staying healthy is seeing the doctor for follow-up care.   If you forget your appointments, there are some things y. you can do to stay on track.    Notes:  Reviewed upcoming appointment with endocrinology and assist with scheduling referral appointment with neurology. Call to neurology office for new referral able to leave a message for office to return call to patient .      02/18/21 Manage My Medicine   Not on track     Timeframe:  Long-Range Goal Priority:  Medium Start Date:   11/23/20                          Expected End Date:   02/16/21                    Follow Up Date 23/31/22   - call for medicine refill 2 or 3 days before it runs out - keep a list of all the medicines I take; vitamins and herbals too  Encouraged taking medications as prescribed.    Why is this important?   . These steps will help you keep on track with  your medicines.   Notes: Collaborating with patient/roommate regarding picking up prescriptions , call to [facility] pharmacy to fill prescriptions as patient/friend will pickup in the 2 days. Reviewed with patient copayments with insurance plan for prescriptions  Will place pharmacy referral regarding patient prescription cost concerns.and medication review     . Monitor and Manage My Blood Sugar-Diabetes Type 1   Not on track    Timeframe:  Long-Range Goal Priority:  High Start Date:   12/13/20                          Expected End Date:   02/16/21                    Follow Up Date 01/19/21   - check blood sugar if I feel it is too high or too low reinforced    Why is this important?    Checking your blood sugar at home helps to keep it from getting very high or very low.   Writing the results in a diary or log helps the doctor know how to care for you.   Your blood sugar log should have the time, the date and the results.   Also, write down the amount of insulin or other medicine you take.   Other information like what you ate, exercise done and how you were feeling will also be helpful..     Notes: Discussed high and low blood sugar signs symptoms and action plan.Discussed plan to contact PCP established for blood sugar meter and supplies of his own     . Set My Target A1C   On track    Timeframe:  Short-Term Goal Priority:  Medium Start Date:   12/13/20                          Expected End Date:  01/18/21                     Follow Up Date : 01/19/21   - set  target A1C    Why is this important?    Your target A1C is decided together by you and your doctor.   It is based on several things like your age and other health issues.    Notes:  Reviewed control A1c goal of 7 .         Plan:  Follow-up:  Patient agrees to Care Plan and Follow-up.Agreeable to follow up call in the next 2 weeks regarding transportation concerns to upcoming appointments . Will send PCP initial assessment once patient established with practice, patient has telephone visit on 3/10 , will send provider secure chat message regarding patient need for prescription of glucose meter and  Supplies with request to send prescription to [facility].    [name], RN, BSN  [facility] Care Management,Care Management Coordinator  [phone]- Mobile [phone]- Toll Free Main Office

## 2020-12-29 ENCOUNTER — Telehealth (INDEPENDENT_AMBULATORY_CARE_PROVIDER_SITE_OTHER): Payer: 59 | Admitting: Internal Medicine

## 2020-12-29 ENCOUNTER — Encounter: Payer: Self-pay | Admitting: Internal Medicine

## 2020-12-29 DIAGNOSIS — Z7689 Persons encountering health services in other specified circumstances: Secondary | ICD-10-CM

## 2020-12-29 DIAGNOSIS — E1065 Type 1 diabetes mellitus with hyperglycemia: Secondary | ICD-10-CM | POA: Diagnosis not present

## 2020-12-29 DIAGNOSIS — G40909 Epilepsy, unspecified, not intractable, without status epilepticus: Secondary | ICD-10-CM | POA: Diagnosis not present

## 2020-12-29 DIAGNOSIS — I824Z1 Acute embolism and thrombosis of unspecified deep veins of right distal lower extremity: Secondary | ICD-10-CM

## 2020-12-29 MED ORDER — ACCU-CHEK AVIVA PLUS W/DEVICE KIT: 1.0000 | PACK | Freq: Every day | 0 refills | Status: DC

## 2020-12-29 MED ORDER — ACCU-CHEK AVIVA PLUS VI STRP: ORAL_STRIP | 12 refills | Status: DC

## 2020-12-29 MED ORDER — LANCETS MISC: 1.0000 "application " | Freq: Every day | 0 refills | Status: DC

## 2020-12-29 NOTE — Progress Notes (Signed)
Concerns about DVT- right leg

## 2020-12-29 NOTE — Progress Notes (Signed)
Virtual Visit via Telephone Note  I connected with Mark [name], on 12/29/2020 at 10:21 AM by telephone due to the COVID-19 pandemic and verified that I am speaking with the correct person using two identifiers.   Consent: I discussed the limitations, risks, security and privacy concerns of performing an evaluation and management service by telephone and the availability of in person appointments. I also discussed with the patient that there may be a patient responsible charge related to this service. The patient expressed understanding and agreed to proceed.   Location of Patient: Home   Location of Provider: Clinic    Persons participating in Telemedicine visit: [place] Dr. Juleen China   History of Present Illness: Patient has a visit to establish care. PMH of T1DM, seizure disorder, mood disorders, h/o DVT in right leg on Xarelto.   Last A1c 8.3 in Feb 2022. Has not been able to monitor his CBGs due to lack of diabetic supplies. Has been taking Novolog 70/30 35u with each meal.   Was on starter pack for Xarelto. Hasn't had any Xarelto for a couple weeks. Plans to pick up continuation of medication today. Has continued to have trouble with ambulation and stiffness in leg, but reports that hasn't changed and was present even before the DVTs. No SOB or chest pain.     Past Medical History:  Diagnosis Date  . Anxiety   . Bipolar disorder ([place])   . Depression   . Diabetes mellitus without complication ([place])   . Diabetic polyneuropathy associated with type 1 diabetes mellitus ([place])   . Hydrocephalus ([place])   . Kidney stones   . Seizures (HCC)    Allergies  Allergen Reactions  . Mushroom Extract Complex Hives, Itching and Nausea And Vomiting  . Penicillins Anaphylaxis, Hives and Swelling    Has patient had a PCN reaction causing immediate rash, facial/tongue/throat swelling, SOB or lightheadedness with hypotension: Yes Has patient had a PCN reaction  causing severe rash involving mucus membranes or skin necrosis: No Has patient had a PCN reaction that required hospitalization: Yes Has patient had a PCN reaction occurring within the last 10 years: Yes If all of the above answers are "NO", then may proceed with Cephalosporin use. Tolerated ceftriaxone 03/24/20  . Sulfa Antibiotics Anaphylaxis and Hives  . Clindamycin/Lincomycin Hives    Current Outpatient Medications on File Prior to Visit  Medication Sig Dispense Refill  . divalproex (DEPAKOTE) 250 MG DR tablet Take 3 tablets (750 mg total) by mouth every evening. 90 tablet 6  . divalproex (DEPAKOTE) 500 MG DR tablet Take 1 tablet (500 mg total) by mouth daily. 30 tablet 6  . escitalopram (LEXAPRO) 20 MG tablet Take 1 tablet (20 mg total) by mouth daily. 30 tablet 1  . gabapentin (NEURONTIN) 100 MG capsule Take 1 capsule (100 mg total) by mouth 2 (two) times daily. 60 capsule 6  . levothyroxine (SYNTHROID) 50 MCG tablet Take 1 tablet (50 mcg total) by mouth daily at 6 (six) AM. 30 tablet 6  . NOVOLIN 70/30 FLEXPEN (70-30) 100 UNIT/ML KwikPen SMARTSIG:35 Unit(s) SUB-Q Twice Daily    . QUEtiapine (SEROQUEL) 100 MG tablet Take 1 tablet (100 mg total) by mouth every morning. 30 tablet 6  . QUEtiapine (SEROQUEL) 400 MG tablet Take 1 tablet (400 mg total) by mouth at bedtime. 30 tablet 6  . RIVAROXABAN (XARELTO) VTE STARTER PACK (15 & 20 MG) Follow package directions: Take one 63m tablet by mouth twice a day. On  day 22, switch to one 29m tablet once a day. Take with food. 51 each 0  . acetaminophen (TYLENOL) 325 MG tablet Take 2 tablets (650 mg total) by mouth every 6 (six) hours as needed for mild pain, fever or headache.    . pyridOXINE (B-6) 50 MG tablet Take 1 tablet (50 mg total) by mouth daily. (Patient not taking: Reported on 12/15/2020) 30 tablet 6  . [DISCONTINUED] insulin aspart (NOVOLOG) 100 UNIT/ML injection Inject 10 Units into the skin 3 (three) times daily with meals. (Patient not  taking: Reported on 03/23/2020) 10 mL 0  . [DISCONTINUED] pregabalin (LYRICA) 150 MG capsule Take 1 capsule (150 mg total) by mouth 2 (two) times daily. (Patient not taking: Reported on 03/23/2020) 60 capsule 0   No current facility-administered medications on file prior to visit.    Observations/Objective: NAD. Speaking clearly.  Work of breathing normal.  Alert and oriented. Mood appropriate.   Assessment and Plan: 1. Encounter to establish care Reviewed patient's PMH, social history, surgical history, and medications.   2. Uncontrolled type 1 diabetes mellitus with hyperglycemia (HCC) Last A1c 8.3 in Feb 2022. Patient has not been monitoring CBGs as does not have supplies. Therefore, unable to adjust insulin. Will send diabetic supplies to pharmacy and encouraged to start keeping log. Will need repeat A1c in 2 months.  Counseled on Diabetic diet, my plate method, 1254minutes of moderate intensity exercise/week Blood sugar logs with fasting goals of 80-120 mg/dl, random of less than 180 and in the event of sugars less than 60 mg/dl or greater than 400 mg/dl encouraged to notify the clinic. Advised on the need for annual eye exams, annual foot exams, Pneumonia vaccine. - Blood Glucose Monitoring Suppl (ACCU-CHEK AVIVA PLUS) w/Device KIT; 1 Device by Does not apply route daily.  Dispense: 1 kit; Refill: 0 - Lancets MISC; 1 application by Does not apply route daily.  Dispense: 100 each; Refill: 0 - glucose blood (ACCU-CHEK AVIVA PLUS) test strip; Use as instructed  Dispense: 100 each; Refill: 12  3. Seizure disorder ([place] Reports last seizure was about 1.5 years ago. Not established with neuro. Reports takes Gabapentin. Will need referral.  - Ambulatory referral to Neurology  4. 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 Encouraged compliance with medication. No symptoms concerning for PE. Leg complaints seem to be  chronic and not changed with lack of anticoagulation. If worsens, would consider repeat LE doppler to ensure has not had failure of anticoagulation.    Follow Up Instructions: 2 month f/u DM    I discussed the assessment and treatment plan with the patient. The patient was provided an opportunity to ask questions and all were answered. The patient agreed with the plan and demonstrated an understanding of the instructions.   The patient was advised to call back or seek an in-person evaluation if the symptoms worsen or if the condition fails to improve as anticipated.     I provided 18 minutes total of non-face-to-face time during this encounter including median intraservice time, reviewing previous notes, investigations, ordering medications, medical decision making, coordinating care and patient verbalized understanding at the end of the visit.    CPhill Myron D.O. [facility] 12/29/2020, 10:21 AM

## 2021-01-02 ENCOUNTER — Telehealth: Payer: Self-pay | Admitting: *Deleted

## 2021-01-02 NOTE — Telephone Encounter (Signed)
   Telephone encounter was:  Successful.  01/02/2021 Name: Mark [name] MRN: [id number] DOB: 20-Oct-1986  Mark [name] is a 35 y.o. year old male who is a primary care patient of Arvilla Market, DO . The community resource team was consulted for assistance with Transportation Needs   Care guide performed the following interventions: Patient provided with information about care guide support team and interviewed to confirm resource needs.  Follow Up Plan:  Care guide will follow up with patient by phone over the next week  Alois Cliche -[facility] Guide , Embedded Care Coordination [facility], Care Management  [phone] [address] [place] , [place] [address] Email : [email or web address]

## 2021-01-02 NOTE — Addendum Note (Signed)
Addended by: [name] A on: 01/02/2021 09:38 AM   Modules accepted: Orders

## 2021-01-09 ENCOUNTER — Other Ambulatory Visit: Payer: Self-pay | Admitting: *Deleted

## 2021-01-09 NOTE — Patient Instructions (Signed)
Diabetes Mellitus Action Plan Following a diabetes action plan is a way for you to manage your diabetes (diabetes mellitus) symptoms. The plan is color-coded to help you understand what actions you need to take based on any symptoms you are having.  If you have symptoms in the red zone, you need medical care right away.  If you have symptoms in the yellow zone, you are having problems.  If you have symptoms in the green zone, you are doing well. Learning about and understanding diabetes can take time. Follow the plan that you develop with your health care provider. Know the target range for your blood sugar (glucose) level, and review your treatment plan with your health care provider at each visit. The target range for my blood sugar level is __________________________ mg/dL. Red zone Get medical help right away if you have any of the following symptoms:  A blood sugar test result that is below 54 mg/dL (3 mmol/L).  A blood sugar test result that is at or above 240 mg/dL (13.3 mmol/L) for 2 days in a row.  Confusion or trouble thinking clearly.  Difficulty breathing.  Sickness or a fever for 2 or more days that is not getting better.  Moderate or large ketone levels in your urine.  Feeling tired or having no energy. If you have any red zone symptoms, do not wait to see if the symptoms will go away. Get medical help right away. Call your local emergency services (911 in the [place] Do not drive yourself to the hospital. If you have severely low blood sugar (severe hypoglycemia) and you cannot eat or drink, you may need glucagon. Make sure a family member or close friend knows how to check your blood sugar and how to give you glucagon. You may need to be treated in a hospital for this condition.   Yellow zone If you have any of the following symptoms, your diabetes is not under control and you may need to make some changes:  A blood sugar test result that is at or above 240 mg/dL (13.3  mmol/L) for 2 days in a row.  Blood sugar test results that are below 70 mg/dL (3.9 mmol/L).  Other symptoms of hypoglycemia, such as: ? Shaking or feeling light-headed. ? Confusion or irritability. ? Feeling hungry. ? Having a fast heartbeat. If you have any yellow zone symptoms:  Treat your hypoglycemia by eating or drinking 15 grams of a rapid-acting carbohydrate. Follow the 15:15 rule: ? Take 15 grams of a rapid-acting carbohydrate, such as:  1 tube of glucose gel.  4 glucose pills.  4 oz (120 mL) of fruit juice.  4 oz (120 mL) of regular (not diet) soda. ? Check your blood sugar 15 minutes after you take the carbohydrate. ? If the repeat blood sugar test is still at or below 70 mg/dL (3.9 mmol/L), take 15 grams of a carbohydrate again. ? If your blood sugar does not increase above 70 mg/dL (3.9 mmol/L) after 3 tries, get medical help right away. ? After your blood sugar returns to normal, eat a meal or a snack within 1 hour.  Keep taking your daily medicines as told by your health care provider.  Check your blood sugar more often than you normally would. ? Write down your results. ? Call your health care provider if you have trouble keeping your blood sugar in your target range.   Green zone These signs mean you are doing well and you can continue what you   are doing to manage your diabetes:  Your blood sugar is within your personal target range. For most people, a blood sugar level before a meal (preprandial) should be 80-130 mg/dL (4.4-7.2 mmol/L).  You feel well, and you are able to do daily activities. If you are in the green zone, continue to manage your diabetes as told by your health care provider. To do this:  Eat a healthy diet.  Exercise regularly.  Check your blood sugar as told by your health care provider.  Take your medicines as told by your health care provider.   Where to find more information  American Diabetes Association (ADA):  diabetes.org  Association of Diabetes Care & Education Specialists (ADCES): diabeteseducator.org Summary  Following a diabetes action plan is a way for you to manage your diabetes symptoms. The plan is color-coded to help you understand what actions you need to take based on any symptoms you are having.  Follow the plan that you develop with your health care provider. Make sure you know your personal target blood sugar level.  Review your treatment plan with your health care provider at each visit. This information is not intended to replace advice given to you by your health care provider. Make sure you discuss any questions you have with your health care provider. Document Revised: 04/14/2020 Document Reviewed: 04/14/2020 Elsevier Patient Education  2021 Elsevier Inc.  

## 2021-01-09 NOTE — Patient Outreach (Signed)
[facility]) Care Management  [place]  01/09/2021   Mark [name] 1985-12-23 [id number]   Telephone Assessment  Referral received: 11/22/20 Referral source: [facility] Referral reason : 2 or more ED visits in January   Admissions in last 6 months  1,  ED visits in last 6 months: 7  PMHX:  Diabetes type 1,insulin dependent, Right leg DVT, seizure disorder,hydoephalus ,anxiety, bipolar disorder, diabetic polyneuropathy.   Subjective:  Successful outreach call to patient, states that he is doing okay.  He reports not being able to pick up glucose meter and supplies until first of month due to financial concerns . He states that he does have 70/30 insulin supply on hand that he gets filled at [facility] and states  taking 3 times a day. He denies symptoms of low blood sugar , he admits to not always watching diet , drinking regular soda.   Mark [name] reports that he was able to pick of some of medications at [facility], he states not able to pick up all medications due to cost. Attempted to review medications with patient ,he states that he does have  Xarelto, unable to recall other medications that he picked up, states they are in the  cabinet and he takes medication per instructions on the bottle.Liana Gerold return call today to review medications that he has on hand he declined.   Encounter Medications:  Outpatient Encounter Medications as of 01/09/2021  Medication Sig Note  . RIVAROXABAN (XARELTO) VTE STARTER PACK (15 & 20 MG) Follow package directions: Take one 58m tablet by mouth twice a day. On day 22, switch to one 270mtablet once a day. Take with food.   . Marland Kitchencetaminophen (TYLENOL) 325 MG tablet Take 2 tablets (650 mg total) by mouth every 6 (six) hours as needed for mild pain, fever or headache.   . Blood Glucose Monitoring Suppl (ACCU-CHEK AVIVA PLUS) w/Device KIT 1 Device by Does not apply route daily.   . divalproex (DEPAKOTE) 250 MG  DR tablet Take 3 tablets (750 mg total) by mouth every evening. 12/15/2020: Awaiting refill pick up   . divalproex (DEPAKOTE) 500 MG DR tablet Take 1 tablet (500 mg total) by mouth daily. 12/15/2020: Awaiting refill pick up   . escitalopram (LEXAPRO) 20 MG tablet Take 1 tablet (20 mg total) by mouth daily. 12/15/2020: Awaiting refill pick up   . gabapentin (NEURONTIN) 100 MG capsule Take 1 capsule (100 mg total) by mouth 2 (two) times daily. 12/15/2020: Awaiting refill pick up   . glucose blood (ACCU-CHEK AVIVA PLUS) test strip Use as instructed   . Lancets MISC 1 application by Does not apply route daily.   . Marland Kitchenevothyroxine (SYNTHROID) 50 MCG tablet Take 1 tablet (50 mcg total) by mouth daily at 6 (six) AM.   . NOVOLIN 70/30 FLEXPEN (70-30) 100 UNIT/ML KwikPen SMARTSIG:35 Unit(s) SUB-Q Twice Daily   . pyridOXINE (B-6) 50 MG tablet Take 1 tablet (50 mg total) by mouth daily. (Patient not taking: Reported on 12/15/2020) 10/02/2020: New Rx  . QUEtiapine (SEROQUEL) 100 MG tablet Take 1 tablet (100 mg total) by mouth every morning. 12/15/2020: [place] refill pickup   . QUEtiapine (SEROQUEL) 400 MG tablet Take 1 tablet (400 mg total) by mouth at bedtime. 12/15/2020: Awaiting refill pick up   . [DISCONTINUED] insulin aspart (NOVOLOG) 100 UNIT/ML injection Inject 10 Units into the skin 3 (three) times daily with meals. (Patient not taking: Reported on 03/23/2020)   . [DISCONTINUED] pregabalin (LYRICA) 150  MG capsule Take 1 capsule (150 mg total) by mouth 2 (two) times daily. (Patient not taking: Reported on 03/23/2020)    No facility-administered encounter medications on file as of 01/09/2021.    Functional Status:  In your present state of health, do you have any difficulty performing the following activities: 04/22/2020 04/10/2020  Hearing? N Y  Vision? N N  Difficulty concentrating or making decisions? N N  Walking or climbing stairs? Y N  Dressing or bathing? Y N  Doing errands, shopping? N N  Some encounter  information is confidential and restricted. Go to Review Flowsheets activity to see all data.  Some recent data might be hidden    Fall/Depression Screening: Fall Risk  12/29/2020 12/05/2020  Falls in the past year? 1 1  Number falls in past yr: 1 1  Injury with Fall? 1 1  Risk for fall due to : Impaired balance/gait;Impaired mobility Impaired balance/gait;Impaired mobility;History of fall(s)  Follow up Falls evaluation completed Falls evaluation completed   PHQ 2/9 Scores 01/09/2021 12/29/2020 12/05/2020 12/01/2020  PHQ - 2 Score 2 2 2 2   PHQ- 9 Score 5 6 6 6    SDOH Screenings   Alcohol Screen: Low Risk   . Last Alcohol Screening Score (AUDIT): 0  Depression (PHQ2-9): Medium Risk  . PHQ-2 Score: 5  Financial Resource Strain: High Risk  . Difficulty of Paying Living Expenses: Very hard  Food Insecurity: Food Insecurity Present  . Worried About Charity fundraiser in the Last Year: Sometimes true  . Ran Out of Food in the Last Year: Sometimes true  Housing: Low Risk   . Last Housing Risk Score: 0  Physical Activity: Not on file  Social Connections: Not on file  Stress: Not on file  Tobacco Use: High Risk  . Smoking Tobacco Use: Current Every Day Smoker  . Smokeless Tobacco Use: Never Used  Transportation Needs: Unmet Transportation Needs  . Lack of Transportation (Medical): Yes  . Lack of Transportation (Non-Medical): Yes    Assessment: Patient will benefit from ongoing , complex case management for management of chronic conditions, assessment and referral and follow up for community  resource needs.  Unable to complete medication review for initial assessment .  Goals Addressed            This Visit's Progress   . Make and Keep All Appointments   On track    Timeframe:  Long-Range Goal Priority:  High Start Date:   11/23/20                          Expected End Date:   02/16/21                    Follow Up Date: 01/20/21    - call to cancel if needed - keep a calendar  with appointment dates  Reinforced keeping all appointments    Why is this important?    Part of staying healthy is seeing the doctor for follow-up care.   If you forget your appointments, there are some things y. you can do to stay on track.    Notes:  Reviewed upcoming appointment with endocrinology, will send referral to [facility] to arrange transportation to visit . Discussed [employer] application being sent to patient he plans to have roommate assist with completing portion of application. Discussed transportation need, with [place] at [facility] that states she included that on medicaid application that that  has transportation needs.      . Manage My Medicine   Not on track    Timeframe:  Long-Range Goal Priority:  Medium Start Date:   11/23/20                          Expected End Date:   02/16/21                    Follow Up Date: 01/20/21   - call for medicine refill 2 or 3 days before it runs out - keep a list of all the medicines I take; vitamins and herbals too  Encouraged taking medications as prescribed.    Why is this important?   . These steps will help you keep on track with your medicines.   Notes: Patient reports picking up some medications could not get all due to cost.He verifies that he has xarelto. Declined reviewing medications that he does have.  Will place pharmacy referral regarding patient prescription cost concerns.and medication review     . Monitor and Manage My Blood Sugar-Diabetes Type 1   Not on track    Timeframe:  Long-Range Goal Priority:  High Start Date:   12/13/20                          Expected End Date:   02/16/21                    Follow Up Date 01/20/21   - check blood sugar if I feel it is too high or too low reinforced    Why is this important?    Checking your blood sugar at home helps to keep it from getting very high or very low.   Writing the results in a diary or log helps the doctor know how to care for you.   Your  blood sugar log should have the time, the date and the results.   Also, write down the amount of insulin or other medicine you take.   Other information like what you ate, exercise done and how you were feeling will also be helpful..     Notes: Reviewed importance of monitoring blood sugar to identify control of diabetes. Reinforced importance of having meter on hand.     . Set My Target A1C   Not on track    Timeframe:  Short-Term Goal Priority:  Medium Start Date:   12/13/20                          Expected End Date:  01/18/21                     Follow Up Date : 01/20/21   - set target A1C    Why is this important?    Your target A1C is decided together by you and your doctor.   It is based on several things like your age and other health issues.    Notes:  Reviewed control A1c goal of 7 discussed patient recent reading of 8.3% being above control range. Discussed measures such as limiting sugar drinking to help with lowering A1c.         Plan:  Follow-up:  Patient agrees to Care Plan and Follow-up.Patient agreeable to follow up call in the 3 weeks .  Follow up call to [facility]  Center that is working on patient applications for Disability and Medicaid , plans follow up with patient on this week.  Will plan referral to [place] for patient appointment to Endocrinology on 01/16/21.  Will place pharmacy referral due to patient complaint of copayment cost of medications patient agreeable.  Will send PCP initial visit note.    Joylene Draft, RN, BSN  [place] Management Coordinator  [phone]- Mobile [phone]- Toll Free Main Office

## 2021-01-09 NOTE — Patient Outreach (Signed)
[facility] Customer service manager [facility]) Care Management  01/09/2021  Mark [name] 1986/10/06 [id number]  Referral for medication assistance from [name], RN sent to [facility].  [name] Management Assistant [phone]

## 2021-01-10 ENCOUNTER — Telehealth: Payer: Self-pay | Admitting: Pharmacist

## 2021-01-10 ENCOUNTER — Other Ambulatory Visit: Payer: Self-pay | Admitting: *Deleted

## 2021-01-10 DIAGNOSIS — Z596 Low income: Secondary | ICD-10-CM

## 2021-01-10 NOTE — Progress Notes (Signed)
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 Patient has been hospitalized multiple times for hyperglycemia.  He has a history of medication non-compliance due to cost.   Objective: Allergies  Allergen Reactions  . Mushroom Extract Complex Hives, Itching and Nausea And Vomiting  . Penicillins Anaphylaxis, Hives and Swelling    Has patient had a PCN reaction causing immediate rash, facial/tongue/throat swelling, SOB or lightheadedness with hypotension: Yes Has patient had a PCN reaction causing severe rash involving mucus membranes or skin necrosis: No Has patient had a PCN reaction that required hospitalization: Yes Has patient had a PCN reaction occurring within the last 10 years: Yes If all of the above answers are "NO", then may proceed with Cephalosporin use. Tolerated ceftriaxone 03/24/20  . Sulfa Antibiotics Anaphylaxis and Hives  . Clindamycin/Lincomycin Hives   Assessment: Patient reported difficulty affording Xarelto. He said he was given a coupon to get Xarelto and had been paying $10 but wondered If he could get another coupon.  He said he is purchasing insulin from [name] and is paying cash.    Medication Review Findings:  . Medication non-compliance due to cost.  Patient has re-started a Xarelto starter pack.   Medication Assistance Findings:  Medication assistance needs identified: Patient has Nurse, learning disability through [employer]. Because he has Nurse, learning disability, he is NOT eligible for traditional patient assistance programs.  Since he does not have Medicare, he is not eligible for LIS/Extra  Help.    Patient reported he completed applications for disability as well as Medicaid.    Additional medication assistance options reviewed with patient as warranted:  Coupon and Discount pharmacy lists   Patient was given a Tax adviser at discharge.  The [name] program was called.  The representative stated the patient has two more refills on the coupon he was entered and will be able to get a 30 day supply each time for $10.  After the second refill, the patient will no longer be eligible for the coupon program.  At that time, he can apply for the [name] which was designed for patients that have high deductible plans (like [employer]).  With the [name] a 30 day supply of Xarelto will cost the patient $85 per month.    Patient said he could not pay $85 and was not interested in completing the application for the [employer] [name] at the time of our call.  Since he already applied for Medicaid, perhaps his determination will be provided before he runs out of uses of the current Xarelto coupon.  Plan: Follow up with the patient in 1 month to see if he was approved for Medicaid. If he has not been approved but would like to apply for the [facility] [name], I will assist him with that if needed.  Mark [name], PharmD, BCACP [name] Clinical Pharmacist [phone]

## 2021-01-10 NOTE — Patient Outreach (Signed)
[facility]) Care Management  01/10/2021  ADAM SANJUAN [birth year] [id number]   Care Coordination Call   Incoming call from patient he discussed being anxious regarding possibility of having to be on Xarelto the rest of his life. He questions me as to how long he will need to be on medications, I discussed it will be determined by his provider.  He reports beginning starter pack of Xarelto in the last 2 weeks, per [facility]  medication picked up on 3/10. Patient states that he has been consistent in taking the medication as directed on package. Patient voiced concern regarding cost of medication if his medicaid is not approved. He discussed being informed cost of medication could be up to $400 per month. He states also being informed that he has a coupon on file for the next 2 months, he states that he hopes it will cover the regular dose not starter pack.  He voiced being anxious about the possibility of being of having to be on the medication the rest of his life.Discussed purpose of medication .  Offered to call his PCP office to discuss his concerns about his questions about length of time he will need to be on Xarelto, explained that I am unable to answer that question. Placed call to Dr. Earlene Plater office to discuss patient concern regarding how long he will need to be on Xarelto able to leave a message on nurse for return call to patient /RN care coordinator. Myshawn reports that Miama from the [facility] came to his home on today to complete his application for Medicaid.  Patient discussed concern regarding transportation if medicaid not approved. Reassured patient that referral to Transportation services [place] has been sent.  Patient discussed receiving [employer] application and is working on his portion, he has return address and provided contact telephone number.   Discussed with patient upcoming appointments and referrals: Discussed appointment with  Endocrinology on 3/28- at 3:15, transportation assist request has been placed and he should receive call from department in the next 48 hours.  Neurology appointment on 5/12 Referral to Psychiatry at [facility] health patient states that he has not heard from that office and wants to schedule an appointment.  He is agreeable to assistance  with placing call on today, placed 3 way call, per Behavorial health they do not accept [facility] and states when they receive a referral  they usually send it to [employer] . Patient agreeable to call to that [facility] health , representative states that they take walk in's Monday through Friday 8 am to 11 am and 1st available appointments for initial inpatient is in June. Patient is not interested in walk in service due to having to wait.  Representative states that often send request to [facility]  that takes referrals or self referrals and take [facility] , patient agreeable to contacting that office. Placed call to office to [facility] to  arrange appointment, able to leave a message for return call provided patient contact number.    Plan Will return call to patient in the next 2 weeks as he is agreeable, patient has my contact number to reach sooner for new concerns, will follow up with patient sooner if contacted from PCO office.    [name], RN, BSN  [facility] Care Management,Care Management Coordinator  [phone]- Mobile [phone]- Toll Free Main Office

## 2021-01-16 ENCOUNTER — Encounter: Payer: Self-pay | Admitting: Endocrinology

## 2021-01-16 ENCOUNTER — Other Ambulatory Visit: Payer: Self-pay

## 2021-01-16 ENCOUNTER — Ambulatory Visit (INDEPENDENT_AMBULATORY_CARE_PROVIDER_SITE_OTHER): Payer: 59 | Admitting: Endocrinology

## 2021-01-16 VITALS — BP 110/68 | HR 96 | Ht 74.0 in | Wt 226.0 lb

## 2021-01-16 DIAGNOSIS — E1042 Type 1 diabetes mellitus with diabetic polyneuropathy: Secondary | ICD-10-CM | POA: Diagnosis not present

## 2021-01-16 LAB — POCT GLYCOSYLATED HEMOGLOBIN (HGB A1C): Hemoglobin A1C: 8 % — AB (ref 4.0–5.6)

## 2021-01-16 MED ORDER — FREESTYLE LITE TEST VI STRP: 1.0000 | ORAL_STRIP | Freq: Three times a day (TID) | 3 refills | Status: DC

## 2021-01-16 MED ORDER — FREESTYLE LITE DEVI: 1.0000 | Freq: Once | 0 refills | Status: AC

## 2021-01-16 MED ORDER — NOVOLIN 70/30 RELION (70-30) 100 UNIT/ML ~~LOC~~ SUSP: 35.0000 [IU] | Freq: Three times a day (TID) | SUBCUTANEOUS | 11 refills | Status: DC

## 2021-01-16 NOTE — Patient Instructions (Addendum)
good diet and exercise significantly improve the control of your diabetes.  please let me know if you wish to be referred to a dietician.  high blood sugar is very risky to your health.  you should see an eye doctor and dentist every year.  It is very important to get all recommended vaccinations.  Controlling your blood pressure and cholesterol drastically reduces the damage diabetes does to your body.  Those who smoke should quit.  Please discuss these with your doctor.  check your blood sugar 3 times a day.  vary the time of day when you check, between before the 3 meals, and at bedtime.  also check if you have symptoms of your blood sugar being too high or too low.  please keep a record of the readings and bring it to your next appointment here (or you can bring the meter itself).  You can write it on any piece of paper.  please call us sooner if your blood sugar goes below 70, or if most of your readings are over 200.    I have sent a prescription to your pharmacy, for a new meter and strips.  It is important to make sure you have all of your medications, so please call your pharmacy for refills.   Please come back for a follow-up appointment in 1 month.

## 2021-01-16 NOTE — Progress Notes (Signed)
Subjective:    Patient ID: Mark [name], male    DOB: 05-Jan-1986, 35 y.o.   MRN: [id number]  HPI pt is referred by Maurene Capes, PA, for diabetes.  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  Last episode of severe hypoglycemia was in 2021.  Last episode of DKA was also in 2021.  He can only afford [facility] insulin.  He takes 35 units 3 times a day (just before each meal).  TID frequency is confirmed by readback.  He say he tries to not miss insulin.  He does not have a meter or strips.   Past Medical History:  Diagnosis Date  . Anxiety   . Bipolar disorder (HCC)   . Depression   . Diabetes mellitus without complication (HCC)   . Diabetic polyneuropathy associated with type 1 diabetes mellitus (HCC)   . Hydrocephalus (HCC)   . Kidney stones   . Seizures (HCC)     Past Surgical History:  Procedure Laterality Date  . KIDNEY STONE SURGERY      Social History   Socioeconomic History  . Marital status: Single    Spouse name: Not on file  . Number of children: Not on file  . Years of education: Not on file  . Highest education level: Not on file  Occupational History  . Not on file  Tobacco Use  . Smoking status: Current Every Day Smoker    Packs/day: 1.00    Years: 20.00    Pack years: 20.00    Types: Cigarettes  . Smokeless tobacco: Never Used  Vaping Use  . Vaping Use: Never used  Substance and Sexual Activity  . Alcohol use: Yes    Comment: occassional   . Drug use: Yes    Frequency: 7.0 times per week    Types: Marijuana    Comment: smoked 2-3 days ago, usually smokes daily  . Sexual activity: Not Currently  Other Topics Concern  . Not on file  Social History Narrative  . Not on file   Social Determinants of Health   Financial Resource Strain: High Risk  . Difficulty of Paying Living Expenses: Very hard   Food Insecurity: Food Insecurity Present  . Worried About Programme researcher, broadcasting/film/video in the Last Year: Sometimes true  . Ran Out of Food in the Last Year: Sometimes true  Transportation Needs: Unmet Transportation Needs  . Lack of Transportation (Medical): Yes  . Lack of Transportation (Non-Medical): Yes  Physical Activity: Not on file  Stress: Not on file  Social Connections: Not on file  Intimate Partner Violence: Not on file    Current Outpatient Medications on File Prior to Visit  Medication Sig Dispense Refill  . acetaminophen (TYLENOL) 325 MG tablet Take 2 tablets (650 mg total) by mouth every 6 (six) hours as needed for mild pain, fever or headache.    . divalproex (DEPAKOTE) 250 MG DR tablet Take 3 tablets (750 mg total) by mouth every evening. 90 tablet 6  . divalproex (DEPAKOTE) 500 MG DR tablet Take 1 tablet (500 mg total) by mouth daily. 30 tablet 6  . escitalopram (LEXAPRO) 20 MG tablet Take 1 tablet (20 mg total) by mouth daily. 30 tablet 1  . gabapentin (NEURONTIN) 100 MG capsule Take 1 capsule (100 mg total) by mouth 2 (two)  times daily. 60 capsule 6  . Lancets MISC 1 application by Does not apply route daily. 100 each 0  . levothyroxine (SYNTHROID) 50 MCG tablet Take 1 tablet (50 mcg total) by mouth daily at 6 (six) AM. 30 tablet 6  . pyridOXINE (B-6) 50 MG tablet Take 1 tablet (50 mg total) by mouth daily. 30 tablet 6  . QUEtiapine (SEROQUEL) 100 MG tablet Take 1 tablet (100 mg total) by mouth every morning. 30 tablet 6  . QUEtiapine (SEROQUEL) 400 MG tablet Take 1 tablet (400 mg total) by mouth at bedtime. 30 tablet 6  . RIVAROXABAN (XARELTO) VTE STARTER PACK (15 & 20 MG) Follow package directions: Take one 15mg  tablet by mouth twice a day. On day 22, switch to one 20mg  tablet once a day. Take with food. 51 each 0  . [DISCONTINUED] insulin aspart (NOVOLOG) 100 UNIT/ML injection Inject 10 Units into the skin 3 (three) times daily with meals. (Patient not taking: Reported on  03/23/2020) 10 mL 0  . [DISCONTINUED] pregabalin (LYRICA) 150 MG capsule Take 1 capsule (150 mg total) by mouth 2 (two) times daily. (Patient not taking: Reported on 03/23/2020) 60 capsule 0   No current facility-administered medications on file prior to visit.    Allergies  Allergen Reactions  . Mushroom Extract Complex Hives, Itching and Nausea And Vomiting  . Penicillins Anaphylaxis, Hives and Swelling    Has patient had a PCN reaction causing immediate rash, facial/tongue/throat swelling, SOB or lightheadedness with hypotension: Yes Has patient had a PCN reaction causing severe rash involving mucus membranes or skin necrosis: No Has patient had a PCN reaction that required hospitalization: Yes Has patient had a PCN reaction occurring within the last 10 years: Yes If all of the above answers are "NO", then may proceed with Cephalosporin use. Tolerated ceftriaxone 03/24/20  . Sulfa Antibiotics Anaphylaxis and Hives  . Clindamycin/Lincomycin Hives    Family History  Problem Relation Age of Onset  . Breast cancer Mother   . Diabetes Paternal Aunt     BP 110/68 (BP Location: Right Arm, Patient Position: Sitting, Cuff Size: Normal)   Pulse 96   Ht 6\' 2"  (1.88 m)   Wt 226 lb (102.5 kg)   SpO2 98%   BMI 29.02 kg/m     Review of Systems denies weight loss, blurry vision, chest pain, sob, n/v, urinary frequency.  No change in chronic depression.       Objective:   Physical Exam VITAL SIGNS:  See vs page GENERAL: no distress Pulses: dorsalis pedis intact bilat.   MSK: no deformity of the feet CV: no leg edema.   Skin:  no ulcer on the feet.  normal color and temp on the feet. Neuro: sensation is intact to touch on the feet, but decreased from normal Ext: there is bilateral onychomycosis of the toenails.   GAIT: uses a cane.    Lab Results  Component Value Date   CREATININE 1.06 11/05/2020   BUN 8 11/05/2020   NA 132 (L) 11/05/2020   K 4.2 11/05/2020   CL 97 (L) 11/05/2020    CO2 21 (L) 11/05/2020    Lab Results  Component Value Date   HGBA1C 8.3 (A) 12/05/2020   I have reviewed outside records, and summarized: Pt was noted to have elevated A1c, and referred here.  He has h/o Noncompliance and leaving hosp AMA.     Assessment & Plan:  I advised him to reduce insulin to BID, but he declines.  Patient Instructions  good diet and exercise significantly improve the control of your diabetes.  please let me know if you wish to be referred to a dietician.  high blood sugar is very risky to your health.  you should see an eye doctor and dentist every year.  It is very important to get all recommended vaccinations.  Controlling your blood pressure and cholesterol drastically reduces the damage diabetes does to your body.  Those who smoke should quit.  Please discuss these with your doctor.  check your blood sugar 3 times a day.  vary the time of day when you check, between before the 3 meals, and at bedtime.  also check if you have symptoms of your blood sugar being too high or too low.  please keep a record of the readings and bring it to your next appointment here (or you can bring the meter itself).  You can write it on any piece of paper.  please call us sooner if your blood sugar goes below 70, or if most of your readings are over 200.    I have sent a prescription to your pharmacy, for a new meter and strips.  It is important to make sure you have all of your medications, so please call your pharmacy for refills.   Please come back for a follow-up appointment in 1 month.

## 2021-01-23 ENCOUNTER — Other Ambulatory Visit: Payer: Self-pay | Admitting: *Deleted

## 2021-01-23 ENCOUNTER — Other Ambulatory Visit (HOSPITAL_COMMUNITY): Payer: Self-pay

## 2021-01-23 ENCOUNTER — Other Ambulatory Visit: Payer: Self-pay | Admitting: Internal Medicine

## 2021-01-23 MED ORDER — RIVAROXABAN 20 MG PO TABS
20.0000 mg | ORAL_TABLET | Freq: Every day | ORAL | 3 refills | Status: DC
  Filled 2021-01-23 – 2021-03-15 (×2): qty 30, 30d supply, fill #0

## 2021-01-23 NOTE — Patient Outreach (Signed)
[facility]) Care Management  [facility] Care Manager  01/23/2021   Mark [name] [birth year] [id number]   Telephone Assessment  Referral received: 11/22/20 Referral source: [facility] Referral reason : 2 or more ED visits in January   Admissions in last 6 months  1,  ED visits in last 6 months: 7  PMHX:  Diabetes type 1,insulin dependent, Right leg DVT, seizure disorder,hydoephalus ,anxiety, bipolar disorder, diabetic polyneuropathy.    Subjective:  Patient reports that he is doing pretty good. He reports that he is has about 6 pills of Xarelto once daily of starter pack, he questions how long he will need to continue to  take. He discussed if possible to have prescriptions at [facility]  Patient discussed recent visit to endocrinology, acknowledged being aware of prescription for blood sugar monitor and supplies. Patient is unaware of cost of blood sugar meter/supplies. [facility]. He report still taking insulin as 3 times daily and has supply on hand.  He states he can't afford meter and some medications copayment  and is waiting until hears back about his disability , he states that he will call [place] at [place] [facility] about process and when he anticipates to hear back.    Encounter Medications:  Outpatient Encounter Medications as of 01/23/2021  Medication Sig Note  . acetaminophen (TYLENOL) 325 MG tablet Take 2 tablets (650 mg total) by mouth every 6 (six) hours as needed for mild pain, fever or headache.   . divalproex (DEPAKOTE) 250 MG DR tablet Take 3 tablets (750 mg total) by mouth every evening. 12/15/2020: Awaiting refill pick up   . divalproex (DEPAKOTE) 500 MG DR tablet Take 1 tablet (500 mg total) by mouth daily. 12/15/2020: Awaiting refill pick up   . escitalopram (LEXAPRO) 20 MG tablet Take 1 tablet (20 mg total) by mouth daily.  12/15/2020: Awaiting refill pick up   . gabapentin (NEURONTIN) 100 MG capsule Take 1 capsule (100 mg total) by mouth 2 (two) times daily. 12/15/2020: Awaiting refill pick up   . glucose blood (FREESTYLE LITE) test strip 1 each by Other route in the morning, at noon, and at bedtime. And lancets 3/day   . insulin NPH-regular Human (NOVOLIN 70/30 RELION) (70-30) 100 UNIT/ML injection Inject 35 Units into the skin 3 (three) times daily with meals. And syringes 1/day   . Lancets MISC 1 application by Does not apply route daily.   Marland Kitchen levothyroxine (SYNTHROID) 50 MCG tablet Take 1 tablet (50 mcg total) by mouth daily at 6 (six) AM.   . pyridOXINE (B-6) 50 MG tablet Take 1 tablet (50 mg total) by mouth daily. 10/02/2020: New Rx  . QUEtiapine (SEROQUEL) 100 MG tablet Take 1 tablet (100 mg total) by mouth every morning. 12/15/2020: [facility] refill pickup   . QUEtiapine (SEROQUEL) 400 MG tablet Take 1 tablet (400 mg total) by mouth at bedtime. 12/15/2020: Awaiting refill pick up   . [DISCONTINUED] insulin aspart (NOVOLOG) 100 UNIT/ML injection Inject 10 Units into the skin 3 (three) times daily with meals. (Patient not taking: Reported on 03/23/2020)   . [DISCONTINUED] pregabalin (LYRICA) 150 MG capsule Take 1 capsule (150 mg total) by mouth 2 (two) times daily. (Patient not taking: Reported on 03/23/2020)   . [DISCONTINUED] RIVAROXABAN (XARELTO) VTE STARTER PACK (15 & 20 MG) Follow package directions: Take one 15mg  tablet by mouth twice a day. On day 22, switch  to one 20mg  tablet once a day. Take with food.    No facility-administered encounter medications on file as of 01/23/2021.    Functional Status:  In your present state of health, do you have any difficulty performing the following activities: 04/22/2020 04/10/2020  Hearing? N Y  Vision? N N  Difficulty concentrating or making decisions? N N  Walking or climbing stairs? Y N  Dressing or bathing? Y N  Doing errands, shopping? N N  Some encounter information is  confidential and restricted. Go to Review Flowsheets activity to see all data.  Some recent data might be hidden    Fall/Depression Screening: Fall Risk  12/29/2020 12/05/2020  Falls in the past year? 1 1  Number falls in past yr: 1 1  Injury with Fall? 1 1  Risk for fall due to : Impaired balance/gait;Impaired mobility Impaired balance/gait;Impaired mobility;History of fall(s)  Follow up Falls evaluation completed Falls evaluation completed   PHQ 2/9 Scores 01/09/2021 12/29/2020 12/05/2020 12/01/2020  PHQ - 2 Score 2 2 2 2   PHQ- 9 Score 5 6 6 6     Assessment: Patient will benefit from ongoing education and support regarding management of chronic condition Diabetes.  Goals Addressed            This Visit's Progress   . Make and Keep All Appointments   On track    Timeframe:  Long-Range Goal Priority:  High Start Date:   11/23/20                          Expected End Date:   02/16/21                    Follow Up Date: 02/18/21    - call to cancel if needed - keep a calendar with appointment dates  Reinforced keeping all appointments    Why is this important?    Part of staying healthy is seeing the doctor for follow-up care.   If you forget your appointments, there are some things y. you can do to stay on track.    Notes: commended on attending endocrinology appt, review visit summary     . Manage My Medicine   Not on track    Timeframe:  Long-Range Goal Priority:  Medium Start Date:   11/23/20                          Expected End Date:   02/16/21                    Follow Up Date: 02/18/21   - call for medicine refill 2 or 3 days before it runs out - keep a list of all the medicines I take; vitamins and herbals too  Encouraged taking medications as prescribed.    Why is this important?   . These steps will help you keep on track with your medicines.   Notes: Verified patient still taking Xarelto as prescribed , reports having 6 doses of daily dose, he requested next  [facility] [address] [facility] [place] and if he needs to continue Xarelto. Message to PCP , xarelto prescription for daily dose to be sent to pharmacy. Placed call to [facility] and per recommendation [facility] and request [facility] be [facility]. [facility] to explain,per [facility] pharmacy patient coupon for refill on Xarelto should apply  also at [facility]. Explained to patient . He reports be unable to afford copayments on medication. He will call [place] at [facility] to follow up on Medicaid/ Disability. Cost of meter at [facility] $5, strips $38, Lancets$21.     . Monitor and Manage My Blood Sugar-Diabetes Type 1   Not on track    Timeframe:  Long-Range Goal Priority:  High Start Date:   12/13/20                          Expected End Date:   02/16/21                    Follow Up Date 02/18/21   - check blood sugar if I feel it is too high or too low reinforced    Why is this important?    Checking your blood sugar at home helps to keep it from getting very high or very low.   Writing the results in a diary or log helps the doctor know how to care for you.   Your blood sugar log should have the time, the date and the results.   Also, write down the amount of insulin or other medicine you take.   Other information like what you ate, exercise done and how you were feeling will also be helpful..     Notes: Patient not working toward this goal , no appropriate at this time     . Set My Target A1C   On track    Timeframe:  Short-Term Goal Priority:  Medium Start Date:   12/13/20                          Expected End Date:  01/18/21                     Follow Up Date : 02/18/21   - set target A1C    Why is this important?    Your target A1C is decided together by you and your doctor.   It is based on several things like your age and other health issues.    Notes:  Reviewed control A1c goal of 7 discussed patient recent reading of 8.3% being above control  range. Reinforce diet changes such as limiting sweet soda, sweet snacks.         Plan:  Follow-up:  Patient agrees to Care Plan and Follow-up. Patient agreeable to return call in the next 2 weeks and to call sooner if new concerns arise  Placed call to [facility] - regarding Xarelto representative states patient has coupon on file that will include next Xarelto prescription, with cost $10 but he will need a new prescription for next dose after starter packet completed. Discussed patient request for [facility], Representative states [facility] will [facility] requesting prescriptions,and that coupon for xarelto will included.  Pl[facility] pharmacy ,to request prescriptions be transferred to [facility] at [facility] [address] per patient request . Secure chat message to Dr. Earlene Plater regarding patient need for prescription for Xarelto daily dosing if continue on daily basis after starter pack , she is agreeable to sending prescription to pharmacy.    Mark Garibaldi, RN, BSN  [facility] Care Management,Care Management Coordinator  [phone]- Mobile [phone]- Toll Free Main Office

## 2021-02-01 ENCOUNTER — Other Ambulatory Visit: Payer: Self-pay | Admitting: *Deleted

## 2021-02-01 NOTE — Patient Outreach (Signed)
[facility]) Care Management  [facility] Care Manager  02/01/2021   Mark [name] 26-Feb-1986 [id number]   Telephone Assessment  Referral received: 11/22/20 Referral source: [facility] Referral reason : 2 or more ED visits in January   Admissions in last 6 months 1, ED visits in last 6 months: 7  PMHX:  Diabetes type 1,insulin dependent, Right leg DVT, seizure disorder,hydoephalus ,anxiety, bipolar disorder, diabetic polyneuropathy.  Subjective:  Successful outreach call to patient, report that he is doing alright. He reports having some difficulty with sleeping at times. He discussed having difficulty affording medications prescribed due to not having copayment, he reports speaking with representative recently at [facility] that is assisted him with applying for Disability/medicaid, stating both are still in process.Discussed with patient that his [facility] now [facility] from [facility]. He reports having one dose of Xarelto left reinforced cost saving of $10 copayment on next prescription on file, encouraged continuing medications as prescribed, states his roommate may be able to assist with that one.  He reports continuing to have insulin supply on hand and taking as prescribed, denies having noted symptoms of low blood. He reports being unable to afford the copayment for blood sugar monitoring supplies that is over $40, he expressed being hopeful for medicaid soon to make it easier.   Encounter Medications:  Outpatient Encounter Medications as of 02/01/2021  Medication Sig Note  . acetaminophen (TYLENOL) 325 MG tablet Take 2 tablets (650 mg total) by mouth every 6 (six) hours as needed for mild pain, fever or headache.   . divalproex (DEPAKOTE) 250 MG DR tablet Take 3 tablets (750 mg total) by mouth every evening. 12/15/2020: Awaiting refill pick up   . divalproex (DEPAKOTE) 500 MG DR tablet Take 1 tablet (500  mg total) by mouth daily. 12/15/2020: Awaiting refill pick up   . doxycycline (VIBRAMYCIN) 100 MG capsule TAKE 1 CAPSULE BY MOUTH 2 TIMES DAILY FOR 7 DAYS   . escitalopram (LEXAPRO) 20 MG tablet Take 1 tablet (20 mg total) by mouth daily. 12/15/2020: Awaiting refill pick up   . escitalopram (LEXAPRO) 20 MG tablet TAKE 1 TABLET BY MOUTH DAILY.   Marland Kitchen gabapentin (NEURONTIN) 100 MG capsule Take 1 capsule (100 mg total) by mouth 2 (two) times daily. 12/15/2020: Awaiting refill pick up   . glucose blood (FREESTYLE LITE) test strip 1 each by Other route in the morning, at noon, and at bedtime. And lancets 3/day   . insulin aspart protamine- aspart (NOVOLOG MIX 70/30) (70-30) 100 UNIT/ML injection INJECT 35 UNITS INTO THE SKIN TWO TIMES DAILY WITH A MEAL.   Marland Kitchen insulin NPH-regular Human (NOVOLIN 70/30 RELION) (70-30) 100 UNIT/ML injection Inject 35 Units into the skin 3 (three) times daily with meals. And syringes 1/day   . Insulin Syringe-Needle U-100 30G X 1/2" 0.5 ML MISC USE AS DIRECTED   . Lancets MISC 1 application by Does not apply route daily.   Marland Kitchen levothyroxine (SYNTHROID) 50 MCG tablet Take 1 tablet (50 mcg total) by mouth daily at 6 (six) AM.   . pyridOXINE (B-6) 50 MG tablet Take 1 tablet (50 mg total) by mouth daily. 10/02/2020: New Rx  . QUEtiapine (SEROQUEL) 100 MG tablet Take 1 tablet (100 mg total) by mouth every morning. 12/15/2020: [facility] refill pickup   . QUEtiapine (SEROQUEL) 100 MG tablet TAKE 1 TABLET BY MOUTH EVERY MORNING.   . QUEtiapine (SEROQUEL) 400 MG tablet Take 1 tablet (400 mg  total) by mouth at bedtime. 12/15/2020: Awaiting refill pick up   . QUEtiapine (SEROQUEL) 400 MG tablet TAKE 1 TABLET BY MOUTH AT BEDTIME.   . rivaroxaban (XARELTO) 20 MG TABS tablet Take 1 tablet (20 mg total) by mouth daily with supper.   Marland Kitchen RIVAROXABAN (XARELTO) VTE STARTER PACK (15 & 20 MG) FOLLOW PACKAGE DIRECTIONS: TAKE ONE 15MG  TABLET BY MOUTH TWICE A DAY. ON DAY 22, SWITCH TO ONE 20MG  TABLET ONCE A DAY.  TAKE WITH FOOD.   RIVAROXABAN (XARELTO) VTE STARTER PACK (15 & 20 MG) TAKE AS DIRECTED   . [DISCONTINUED] insulin aspart (NOVOLOG) 100 UNIT/ML injection Inject 10 Units into the skin 3 (three) times daily with meals. (Patient not taking: Reported on 03/23/2020)   . [DISCONTINUED] pregabalin (LYRICA) 150 MG capsule Take 1 capsule (150 mg total) by mouth 2 (two) times daily. (Patient not taking: Reported on 03/23/2020)    No facility-administered encounter medications on file as of 02/01/2021.    Functional Status:  In your present state of health, do you have any difficulty performing the following activities: 04/22/2020 04/10/2020  Hearing? N Y  Vision? N N  Difficulty concentrating or making decisions? N N  Walking or climbing stairs? Y N  Dressing or bathing? Y N  Doing errands, shopping? N N  Some encounter information is confidential and restricted. Go to Review Flowsheets activity to see all data.  Some recent data might be hidden    Fall/Depression Screening: Fall Risk  12/29/2020 12/05/2020  Falls in the past year? 1 1  Number falls in past yr: 1 1  Injury with Fall? 1 1  Risk for fall due to : Impaired balance/gait;Impaired mobility Impaired balance/gait;Impaired mobility;History of fall(s)  Follow up Falls evaluation completed Falls evaluation completed   PHQ 2/9 Scores 01/09/2021 12/29/2020 12/05/2020 12/01/2020  PHQ - 2 Score 2 2 2 2   PHQ- 9 Score 5 6 6 6     Assessment: Will benefit from ongoing care management support for education support, management of Diabetes .  Goals Addressed            This Visit's Progress   . Make and Keep All Appointments   On track    Timeframe:  Long-Range Goal Priority:  High Start Date:   11/23/20                          Expected End Date:   04/20/21                 Follow Up Date: 02/28/21    - call to cancel if needed - keep a calendar with appointment dates  Reinforced keeping all appointments    Why is this important?    Part of  staying healthy is seeing the doctor for follow-up care.   If you forget your appointments, there are some things y. you can do to stay on track.    Notes: Reviewed upcoming endocrinology appointment in next month, discussed patient completing and returning SCAT application. Patient discussed follow up on Medicaid/disability in process per [facility] representative assisting him     . Manage My Medicine   Not on track    Timeframe:  Long-Range Goal Priority:  Medium Start Date:   11/23/20                          Expected End Date:   02/16/21  Follow Up Date: 03/20/21   - call for medicine refill 2 or 3 days before it runs out - keep a list of all the medicines I take; vitamins and herbals too  Encouraged taking medications as prescribed.    Why is this important?   . These steps will help you keep on track with your medicines.   Notes: Discussed with patient [facility], Reinforced importance to taking medications as prescribed.  [facility] of [facility] $5, strips $38, Lancets$21, patient unable to afford copayment . Discussed support of friends for copayment support     . Monitor and Manage My Blood Sugar-Diabetes Type 1   Not on track    Timeframe:  Long-Range Goal Priority:  High Start Date:   12/13/20                          Expected End Date:   02/16/21                    Follow Up Date 02/18/21   - check blood sugar if I feel it is too high or too low reinforced    Why is this important?    Checking your blood sugar at home helps to keep it from getting very high or very low.   Writing the results in a diary or log helps the doctor know how to care for you.   Your blood sugar log should have the time, the date and the results.   Also, write down the amount of insulin or other medicine you take.   Other information like what you ate, exercise done and how you were feeling will also be helpful..      Notes: Patient not working toward this goal , no appropriate at this time     . Set My Target A1C   On track    Timeframe:  Short-Term Goal Priority:  Medium Start Date:   12/13/20                          Expected End Date:  01/18/21                     Follow Up Date : 03/20/21   - set target A1C    Why is this important?    Your target A1C is decided together by you and your doctor.   It is based on several things like your age and other health issues.    Notes:  Reviewed control A1c goal of 7 discussed patient recent reading of 8.3% being above control range. Reinforced information on nutrition to help manage diabetes, encouraged limiting daily use of sweet soda, discussed foods to include in balanced meals, carbohydrates, protein sources, encouraged including green vegetables.         Plan:  Follow-up:  Patient agrees to Care Plan and Follow-up. Will schedule return call in the next month, patient encouraged to make contact sooner if new concerns arise.    [name], RN, BSN  [facility] Care Management,Care Management Coordinator  [phone]- Mobile [phone]- Toll Free Main Office

## 2021-02-03 ENCOUNTER — Other Ambulatory Visit (HOSPITAL_COMMUNITY): Payer: Self-pay

## 2021-02-09 ENCOUNTER — Telehealth (HOSPITAL_COMMUNITY): Payer: 59 | Admitting: Physician Assistant

## 2021-02-09 ENCOUNTER — Ambulatory Visit: Payer: 59 | Admitting: Internal Medicine

## 2021-02-09 ENCOUNTER — Other Ambulatory Visit: Payer: Self-pay | Admitting: *Deleted

## 2021-02-09 NOTE — Patient Outreach (Signed)
[facility]) Care Management  02/09/2021  Mark [name] 12/13/1985 [id number]   Care coordination   Subjective: Incoming call from patient he was requesting transportation to [facility] to get ID. He also asked about being provided a  form with his social security number written on it, states he know his number but does not have card to present at [facility] Informed patient that I do no have access to information.Marland Kitchen He reports placing a call to [facility] at [facility] on yesterday and awaiting return call to discuss progress on medicaid and disability. Provided patient with contact number at [facility] to schedule an appointment to get ID card, he states that he roommate will  help with cost of card in the next month.  Discussed with patient recent SCAT application form sent to him for completion he reports losing form  He voiced being agreeable to completing patient portion of application while on the phone. Discussed with patient that he will again need to review application and provide his signature, he is agreeable. Discussed with patient will ask [place] for assistance on her visit to home to provide patient with forms for review/signature he is agreeable.   Placed call to [facility] at [facility] explained to her recent call from patient, she plans to return call.  I discussed some difficulty with patient completing SCAT application forms,he  lost forms. Explained patient agreeable to completing form during phone visit today  and forms will need to be signed by patient. Discussed with [place] if forms sent to her.on her next visit to patient home will she be able to present to patient for review and his signature she is agreeable.  Patient discussed difficulty with affording copayment on medication cost, reports having prescribed insulin and taking . States he has not picked up refill on Xarelto hopeful friend can assist in the next week, [facility] and  copayment benefit of $10.  Reinforced importance on taking medication.  Plan Will assist patient with SCAT application form completion A, send secure  email copy to [facility] , [email or web address],I  will complete section B.  Patient agreeable to return call as scheduled in the next 2 weeks. Reinforced attending his Mental health telehealth visit in the next week on 4/26 as he reports.    [name], RN, BSN  [facility] Care Management,Care Management Coordinator  [phone]- Mobile [phone]- Toll Free Main Office

## 2021-02-13 ENCOUNTER — Ambulatory Visit: Payer: Self-pay | Admitting: *Deleted

## 2021-02-15 ENCOUNTER — Other Ambulatory Visit: Payer: Self-pay | Admitting: *Deleted

## 2021-02-15 NOTE — Patient Instructions (Signed)
Hyperglycemia Hyperglycemia is when the sugar (glucose) level in your blood is too high. High blood sugar can happen to people who have or do not have diabetes. High blood sugar can happen quickly. It can be an emergency. What are the causes? If you have diabetes, high blood sugar may be caused by:  Medicines that increase blood sugar or affect your control of diabetes.  Getting less physical activity.  Overeating.  Being sick or injured or having an infection.  Having surgery.  Stress.  Not giving yourself enough insulin (if you are taking it). You may have high blood sugar because you have diabetes that has not been diagnosed yet. If you do not have diabetes, high blood sugar may be caused by:  Certain medicines.  Stress.  A bad illness.  An infection.  Having surgery.  Diseases of the pancreas. What increases the risk? This condition is more likely to develop in people who have risk factors for diabetes, such as:  Having a family member with diabetes.  Certain conditions in which the body's defense system (immune system) attacks itself. These are called autoimmune disorders.  Being overweight.  Not being active.  Having a condition called insulin resistance.  Having a history of: ? Prediabetes. ? Diabetes when pregnant. ? Polycystic ovarian syndrome (PCOS). What are the signs or symptoms? This condition may not cause symptoms. If you do have symptoms, they may include:  Feeling more thirsty than normal.  Needing to pee (urinate) more often than normal.  Hunger.  Feeling very tired.  Blurry eyesight (vision). You may get other symptoms as the condition gets worse, such as:  Dry mouth.  Pain in your belly (abdomen).  Not being hungry (loss of appetite).  Breath that smells fruity.  Weakness.  Weight loss that is not planned.  A tingling or numb feeling in your hands or feet.  A headache.  Cuts or bruises that heal slowly. How is this  treated? Treatment depends on the cause of your condition. Treatment may include:  Taking medicine to control your blood sugar levels.  Changing your medicine or dosage if you take insulin or other diabetes medicines.  Lifestyle changes. These may include: ? Exercising more. ? Eating healthier foods. ? Losing weight.  Treating an illness or infection.  Checking your blood sugar more often.  Stopping or reducing steroid medicines. If your condition gets very bad, you will need to be treated in the hospital. Follow these instructions at home: General instructions  Take over-the-counter and prescription medicines only as told by your doctor.  Do not smoke or use any products that contain nicotine or tobacco. If you need help quitting, ask your doctor.  If you drink alcohol: ? Limit how much you have to:  0-1 drink a day for women who are not pregnant.  0-2 drinks a day for men. ? Know how much alcohol is in a drink. In the [place] one drink equals one 12 oz bottle of beer (355 mL), one 5 oz glass of wine (148 mL), or one 1 oz glass of hard liquor (44 mL).  Manage stress. If you need help with this, ask your doctor.  Do exercises as told by your doctor.  Keep all follow-up visits. Eating and drinking  Stay at a healthy weight.  Make sure you drink enough fluid when you: ? Exercise. ? Get sick. ? Are in hot temperatures.  Drink enough fluid to keep your pee (urine) pale yellow.   If you   have diabetes:  Know the symptoms of high blood sugar.  Follow your diabetes management plan as told by your doctor. Make sure you: ? Take insulin and medicines as told. ? Follow your exercise plan. ? Follow your meal plan. Eat on time. Do not skip meals. ? Check your blood sugar as often as told. Make sure you check before and after exercise. If you exercise longer or in a different way, check your blood sugar more often. ? Follow your sick day plan whenever you cannot eat or drink  normally. Make this plan ahead of time with your doctor.  Share your diabetes management plan with people in your workplace, school, and household.  Check your pee for ketones when you are ill and as told by your doctor.  Carry a card or wear jewelry that says that you have diabetes.   Where to find more information American Diabetes Association: www.diabetes.org Contact a doctor if:  Your blood sugar level is at or above 240 mg/dL (13.3 mmol/L) for 2 days in a row.  You have problems keeping your blood sugar in your target range.  You have high blood pressure often.  You have signs of illness, such as: ? Feeling like you may vomit (feeling nauseous). ? Vomiting. ? A fever. Get help right away if:  Your blood sugar monitor reads "high" even when you are taking insulin.  You have trouble breathing.  You have a change in how you think, feel, or act (mental status).  You feel like you may vomit, and the feeling does not go away.  You cannot stop vomiting. These symptoms may be an emergency. Get medical help right away. Call your local emergency services (911 in the [place]  Do not wait to see if the symptoms will go away.  Do not drive yourself to the hospital. Summary  Hyperglycemia is when the sugar (glucose) level in your blood is too high.  High blood sugar can happen to people who have or do not have diabetes.  Make sure you drink enough fluids and follow your meal plan. Exercise as often as told by your doctor.  Contact your doctor if you have problems keeping your blood sugar in your target range. This information is not intended to replace advice given to you by your health care provider. Make sure you discuss any questions you have with your health care provider. Document Revised: 07/22/2020 Document Reviewed: 07/22/2020 Elsevier Patient Education  2021 Elsevier Inc.  

## 2021-02-15 NOTE — Patient Outreach (Signed)
[facility]) Care Management  [facility] Care Manager  02/15/2021   Mark [name] 06-04-1986 [id number]    Telephone Assessment  Referral received: 11/22/20 Referral source: [facility] Referral reason : 2 or more ED visits in January   Admissions in last 6 months 1, ED visits in last 6 months: 7  PMHX:  Diabetes type 1,insulin dependent, Right leg DVT, seizure disorder,hydoephalus ,anxiety, bipolar disorder, diabetic polyneuropathy.   Subjective:  Successful outreach call to patient ,he reports doing okay on today.  He discussed having telehealth visit with mental health on today, he is unable to state agency to call him.  He reports that he has still not been able to refill prescription for Xarelto,other meds , and glucose monitoring supplies due to low income. He voiced understanding of having coupon for Xarelto with cost of $10 for refill . He is unsure if his roommate Mark [name] will able to assist due to increase in household expenses, patient states that he does have supply of insulin and taking as prescribed. He denies having episodes of low blood sugar signs or symptoms.    Encounter Medications:  Outpatient Encounter Medications as of 02/15/2021  Medication Sig Note  . acetaminophen (TYLENOL) 325 MG tablet Take 2 tablets (650 mg total) by mouth every 6 (six) hours as needed for mild pain, fever or headache.   . divalproex (DEPAKOTE) 250 MG DR tablet Take 3 tablets (750 mg total) by mouth every evening. 12/15/2020: Awaiting refill pick up   . divalproex (DEPAKOTE) 500 MG DR tablet Take 1 tablet (500 mg total) by mouth daily. 12/15/2020: Awaiting refill pick up   . doxycycline (VIBRAMYCIN) 100 MG capsule TAKE 1 CAPSULE BY MOUTH 2 TIMES DAILY FOR 7 DAYS   . escitalopram (LEXAPRO) 20 MG tablet Take 1 tablet (20 mg total) by mouth daily. 12/15/2020: Awaiting refill pick up   . escitalopram (LEXAPRO) 20 MG tablet TAKE 1 TABLET BY MOUTH DAILY.   Marland Kitchen gabapentin  (NEURONTIN) 100 MG capsule Take 1 capsule (100 mg total) by mouth 2 (two) times daily. 12/15/2020: Awaiting refill pick up   . glucose blood (FREESTYLE LITE) test strip 1 each by Other route in the morning, at noon, and at bedtime. And lancets 3/day   . insulin aspart protamine- aspart (NOVOLOG MIX 70/30) (70-30) 100 UNIT/ML injection INJECT 35 UNITS INTO THE SKIN TWO TIMES DAILY WITH A MEAL.   Marland Kitchen insulin NPH-regular Human (NOVOLIN 70/30 RELION) (70-30) 100 UNIT/ML injection Inject 35 Units into the skin 3 (three) times daily with meals. And syringes 1/day   . Insulin Syringe-Needle U-100 30G X 1/2" 0.5 ML MISC USE AS DIRECTED   . Lancets MISC 1 application by Does not apply route daily.   Marland Kitchen levothyroxine (SYNTHROID) 50 MCG tablet Take 1 tablet (50 mcg total) by mouth daily at 6 (six) AM.   . pyridOXINE (B-6) 50 MG tablet Take 1 tablet (50 mg total) by mouth daily. 10/02/2020: New Rx  . QUEtiapine (SEROQUEL) 100 MG tablet Take 1 tablet (100 mg total) by mouth every morning. 12/15/2020: [place] refill pickup   . QUEtiapine (SEROQUEL) 100 MG tablet TAKE 1 TABLET BY MOUTH EVERY MORNING.   . QUEtiapine (SEROQUEL) 400 MG tablet Take 1 tablet (400 mg total) by mouth at bedtime. 12/15/2020: Awaiting refill pick up   . QUEtiapine (SEROQUEL) 400 MG tablet TAKE 1 TABLET BY MOUTH AT BEDTIME.   . rivaroxaban (XARELTO) 20 MG TABS tablet Take 1 tablet (20 mg total) by mouth  daily with supper.   Marland Kitchen RIVAROXABAN (XARELTO) VTE STARTER PACK (15 & 20 MG) FOLLOW PACKAGE DIRECTIONS: TAKE ONE 15MG  TABLET BY MOUTH TWICE A DAY. ON DAY 22, SWITCH TO ONE 20MG  TABLET ONCE A DAY. TAKE WITH FOOD.   RIVAROXABAN (XARELTO) VTE STARTER PACK (15 & 20 MG) TAKE AS DIRECTED   . [DISCONTINUED] insulin aspart (NOVOLOG) 100 UNIT/ML injection Inject 10 Units into the skin 3 (three) times daily with meals. (Patient not taking: Reported on 03/23/2020)   . [DISCONTINUED] pregabalin (LYRICA) 150 MG capsule Take 1 capsule (150 mg total) by mouth 2  (two) times daily. (Patient not taking: Reported on 03/23/2020)    No facility-administered encounter medications on file as of 02/15/2021.    Functional Status:  In your present state of health, do you have any difficulty performing the following activities: 04/22/2020 04/10/2020  Hearing? N Y  Vision? N N  Difficulty concentrating or making decisions? N N  Walking or climbing stairs? Y N  Dressing or bathing? Y N  Doing errands, shopping? N N  Some encounter information is confidential and restricted. Go to Review Flowsheets activity to see all data.  Some recent data might be hidden    Fall/Depression Screening: Fall Risk  12/29/2020 12/05/2020  Falls in the past year? 1 1  Number falls in past yr: 1 1  Injury with Fall? 1 1  Risk for fall due to : Impaired balance/gait;Impaired mobility Impaired balance/gait;Impaired mobility;History of fall(s)  Follow up Falls evaluation completed Falls evaluation completed   PHQ 2/9 Scores 01/09/2021 12/29/2020 12/05/2020 12/01/2020  PHQ - 2 Score 2 2 2 2   PHQ- 9 Score 5 6 6 6     Assessment:  Goals Addressed            This Visit's Progress   . Make and Keep All Appointments   On track    Timeframe:  Long-Range Goal Priority:  High Start Date:   11/23/20                          Expected End Date:   04/20/21                 Follow Up Date: 03/20/21    - call to cancel if needed - keep a calendar with appointment dates  Reinforced keeping all appointments    Why is this important?    Part of staying healthy is seeing the doctor for follow-up care.   If you forget your appointments, there are some things y. you can do to stay on track.    Notes: Reviewed upcoming endocrinology appointment in next month, discussed SCAT application process. Reinforced attending scheduled mental health telephone visit on today. Patient discussed follow up on Medicaid/disability in process per [facility] representative assisting him . Discussed arranging  transportation for next patient medical appointments     . Manage My Medicine   Not on track    Timeframe:  Long-Range Goal Priority:  Medium Start Date:   11/23/20                          Expected End Date:   02/16/21                    Follow Up Date: 03/20/21   - call for medicine refill 2 or 3 days before it runs out - keep a list of all the  medicines I take; vitamins and herbals too  Encouraged taking medications as prescribed.    Why is this important?   . These steps will help you keep on track with your medicines.   Notes: Patient reports having no incoming finances, roommate assist due to recent household expense increase , decrease support. Giving committee assistance form submitted for possible support     . Monitor and Manage My Blood Sugar-Diabetes Type 1   Not on track    Timeframe:  Long-Range Goal Priority:  High Start Date:   12/13/20                          Expected End Date:   02/16/21                    Follow Up Date 03/20/21   - check blood sugar if I feel it is too high or too low reinforced    Why is this important?    Checking your blood sugar at home helps to keep it from getting very high or very low.   Writing the results in a diary or log helps the doctor know how to care for you.   Your blood sugar log should have the time, the date and the results.   Also, write down the amount of insulin or other medicine you take.   Other information like what you ate, exercise done and how you were feeling will also be helpful..     Notes: Currently unable to afford meter supplies to monitor blood sugars. Will discuss with endocrinology office if free meter supplies available    . Set My Target A1C   On track    Timeframe:  Short-Term Goal Priority:  Medium Start Date:   12/13/20                          Expected End Date:  01/18/21                     Follow Up Date : 03/20/21   - set target A1C    Why is this important?    Your target A1C is decided  together by you and your doctor.   It is based on several things like your age and other health issues.    Notes:  Reinforced importance of control A1c goal of 7 discussed patient recent reading of 8.3% being above control range. Reinforced information on nutrition to help manage diabetes, encouraged limiting daily use of sweet soda, discussed foods to include in balanced meals, carbohydrates, protein sources, encouraged including green vegetables.         Plan:  Follow-up:  Patient agrees to Care Plan and Follow-up. will plan follow up call in the next 2 weeks . Follow up call to [facility] at [facility] regarding SCAT application patient signature.  Will place referral with [facility] [employer] for transportation to endocrinology appointment on 5/10 and 5/12  [name], RN, BSN  [facility] Care Management,Care Management Coordinator  [phone]- Mobile [phone]- Toll Free Main Office

## 2021-02-16 ENCOUNTER — Other Ambulatory Visit: Payer: Self-pay | Admitting: *Deleted

## 2021-02-16 NOTE — Patient Outreach (Signed)
[facility]) Care Management  02/16/2021  Mark [name] Aug 12, 1986 [id number]   Care Coordination    Subjective Successful outreach call to patient regarding [facility] care management  Giving Committee Financial assessment due patient concern for low income, higher [employer] bill. With Patient agreement call to [employer] regarding current balance  $338.09, a payment has been made for this month patient will not receive disconnection notice. [employer] explained payment plan service , patient does not wish to commit to paying extra set amount of $56.35 a month to get caught up at this time, he will discuss with roommate that assist with bills. Patient states roommate will pay extra on bill next month.   Plan Follow up message to [facility] Giving Committee Will plan follow up call to patient as scheduled in the next week assist with coordination transportation for upcoming medical appointment, assist with scheduling appointment to getting ID card.   Encouraged patient to contact care coordinator sooner for new concerns.    [name], RN, BSN  [facility] Care Management,Care Management Coordinator  [phone]- Mobile [phone]- Toll Free Main Office

## 2021-02-21 ENCOUNTER — Ambulatory Visit: Payer: Self-pay | Admitting: *Deleted

## 2021-02-22 ENCOUNTER — Other Ambulatory Visit: Payer: Self-pay

## 2021-02-22 ENCOUNTER — Telehealth (INDEPENDENT_AMBULATORY_CARE_PROVIDER_SITE_OTHER): Payer: 59 | Admitting: Physician Assistant

## 2021-02-22 DIAGNOSIS — F99 Mental disorder, not otherwise specified: Secondary | ICD-10-CM

## 2021-02-22 DIAGNOSIS — F411 Generalized anxiety disorder: Secondary | ICD-10-CM | POA: Diagnosis not present

## 2021-02-22 DIAGNOSIS — F3160 Bipolar disorder, current episode mixed, unspecified: Secondary | ICD-10-CM | POA: Diagnosis not present

## 2021-02-22 DIAGNOSIS — F5105 Insomnia due to other mental disorder: Secondary | ICD-10-CM | POA: Diagnosis not present
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
## 2021-02-23 ENCOUNTER — Other Ambulatory Visit: Payer: Self-pay | Admitting: *Deleted

## 2021-02-23 NOTE — Patient Outreach (Addendum)
[name]) Care Management  [facility] Care Manager  02/23/2021   KABE MCKOY 03/11/1986 [id number]   Telephone Assessment  Care Coordination Referral received: 11/22/20 Referral source: [facility] Referral reason : 2 or more ED visits in January   Admissions in last 6 months 1, ED visits in last 6 months: 7  PMHX:  Diabetes type 1,insulin dependent, Right leg DVT, seizure disorder,hydoephalus ,anxiety, bipolar disorder, diabetic polyneuropathy.   Subjective:  Unsuccessful outreach call to patient, no answer able to leave a HIPAA compliant voicemail message for return call.   Difficulty maintaining contact with patient, for follow up regarding Giving committee agreement for gift card $50 ,support for low income, increase in monthly electric bill, need assistance with getting ID card.  Care Coordination call to [employer] [place] regarding application for services sent on 4/28, able to leave a message answering machine for Toni Amend to return call.     Plan Will plan return call in the next 3 business days if no return call from patient .    [name], RN, BSN  [facility],Care Management Coordinator  [phone]- Mobile [phone]- Toll Free Main Office

## 2021-02-24 ENCOUNTER — Encounter (HOSPITAL_COMMUNITY): Payer: Self-pay | Admitting: Physician Assistant

## 2021-02-24 DIAGNOSIS — F411 Generalized anxiety disorder: Secondary | ICD-10-CM | POA: Insufficient documentation

## 2021-02-24 DIAGNOSIS — F3160 Bipolar disorder, current episode mixed, unspecified: Secondary | ICD-10-CM | POA: Insufficient documentation

## 2021-02-24 MED ORDER — QUETIAPINE FUMARATE 200 MG PO TABS: 200.0000 mg | ORAL_TABLET | Freq: Every day | ORAL | 1 refills | Status: DC

## 2021-02-24 MED ORDER — QUETIAPINE FUMARATE 100 MG PO TABS: 100.0000 mg | ORAL_TABLET | Freq: Every day | ORAL | 1 refills | Status: DC

## 2021-02-24 MED ORDER — DIVALPROEX SODIUM ER 250 MG PO TB24: 750.0000 mg | ORAL_TABLET | Freq: Every evening | ORAL | 1 refills | Status: DC

## 2021-02-24 MED ORDER — ESCITALOPRAM OXALATE 10 MG PO TABS: 10.0000 mg | ORAL_TABLET | Freq: Every day | ORAL | 1 refills | Status: DC

## 2021-02-27 ENCOUNTER — Other Ambulatory Visit: Payer: Self-pay | Admitting: *Deleted

## 2021-02-27 NOTE — Patient Outreach (Signed)
[facility]) Care Management  [facility] Care Manager  02/27/2021   Mark [name] 1986-02-27 [id number]   Telephone Assessment   Referral received: 11/22/20 Referral source: [facility] Referral reason : 2 or more ED visits in January   Admissions in last 6 months 1 ED visits in last 6 months: 7  PMHX:  Diabetes type 1,insulin dependent, Right leg DVT, seizure disorder,hydoephalus ,anxiety, bipolar disorder, diabetic polyneuropathy  Subjective:  Incoming return call from patient , he reports that he is doing okay, He is calling to verify timing  of transportation pick for appointment with endocrinology on 5/10. He reports that he recently picked up insulin from [employer] . He discussed still having difficulty affording copayments for prescriptions and blood glucose meter. He discussed financial difficulties recently related to household expense higher electric bill they are paying on . Discussed with patient approval through giving committee  $50 gift card, arranged time to deliver to patient. Patient roommate Mark [name] reports they are setting up plan with [employer] with payment of $130 per month, he states he will follow up on outstanding balance, as he recently paid on bill 02/25/21.   Marland Kitchen He discussed recently having telehealth visit with [facility] , patient report having some difficulty sleeping at night and  placed his friend Mark [name] on the phone  He states that he may be able to get his Xarelto filled in the next few days,and will [facility]  cost of prescriptions for seroquel ,  stressed the importance of taking medications as prescribed for condition.  Patient discussed needing assistance with getting his ID card.  Care coordination  Placed call to [facility] to schedule patient 1st available appointment June 16 at 2:15 encouraged use of gift to help with cost . Placed email to [facility] transportation regarding patient pick up time for appointment on 5/10 received  message patient will receive a call from dispatch regarding time, patient informed.     Objective:   Encounter Medications:  Outpatient Encounter Medications as of 02/27/2021  Medication Sig Note  . acetaminophen (TYLENOL) 325 MG tablet Take 2 tablets (650 mg total) by mouth every 6 (six) hours as needed for mild pain, fever or headache.   . divalproex (DEPAKOTE ER) 250 MG 24 hr tablet Take 3 tablets (750 mg total) by mouth every evening.   Marland Kitchen doxycycline (VIBRAMYCIN) 100 MG capsule TAKE 1 CAPSULE BY MOUTH 2 TIMES DAILY FOR 7 DAYS   . escitalopram (LEXAPRO) 10 MG tablet Take 1 tablet (10 mg total) by mouth daily.   Marland Kitchen gabapentin (NEURONTIN) 100 MG capsule Take 1 capsule (100 mg total) by mouth 2 (two) times daily. 12/15/2020: Awaiting refill pick up   . glucose blood (FREESTYLE LITE) test strip 1 each by Other route in the morning, at noon, and at bedtime. And lancets 3/day   . insulin aspart protamine- aspart (NOVOLOG MIX 70/30) (70-30) 100 UNIT/ML injection INJECT 35 UNITS INTO THE SKIN TWO TIMES DAILY WITH A MEAL.   Marland Kitchen insulin NPH-regular Human (NOVOLIN 70/30 RELION) (70-30) 100 UNIT/ML injection Inject 35 Units into the skin 3 (three) times daily with meals. And syringes 1/day   . Insulin Syringe-Needle U-100 30G X 1/2" 0.5 ML MISC USE AS DIRECTED   . Lancets MISC 1 application by Does not apply route daily.   Marland Kitchen levothyroxine (SYNTHROID) 50 MCG tablet Take 1 tablet (50 mcg total) by mouth daily at 6 (six) AM.   . pyridOXINE (B-6) 50 MG tablet Take  1 tablet (50 mg total) by mouth daily. 10/02/2020: New Rx  . QUEtiapine (SEROQUEL) 100 MG tablet Take 1 tablet (100 mg total) by mouth daily.   . QUEtiapine (SEROQUEL) 200 MG tablet Take 1 tablet (200 mg total) by mouth at bedtime.   . rivaroxaban (XARELTO) 20 MG TABS tablet Take 1 tablet (20 mg total) by mouth daily with supper.   Marland Kitchen RIVAROXABAN (XARELTO) VTE STARTER PACK (15 & 20 MG) FOLLOW PACKAGE DIRECTIONS: TAKE ONE 15MG  TABLET BY MOUTH TWICE A  DAY. ON DAY 22, SWITCH TO ONE 20MG  TABLET ONCE A DAY. TAKE WITH FOOD.   RIVAROXABAN (XARELTO) VTE STARTER PACK (15 & 20 MG) TAKE AS DIRECTED   . [DISCONTINUED] insulin aspart (NOVOLOG) 100 UNIT/ML injection Inject 10 Units into the skin 3 (three) times daily with meals. (Patient not taking: Reported on 03/23/2020)   . [DISCONTINUED] pregabalin (LYRICA) 150 MG capsule Take 1 capsule (150 mg total) by mouth 2 (two) times daily. (Patient not taking: Reported on 03/23/2020)    No facility-administered encounter medications on file as of 02/27/2021.    Functional Status:  In your present state of health, do you have any difficulty performing the following activities: 04/22/2020 04/10/2020  Hearing? N Y  Vision? N N  Difficulty concentrating or making decisions? N N  Walking or climbing stairs? Y N  Dressing or bathing? Y N  Doing errands, shopping? N N  Some recent data might be hidden    Fall/Depression Screening: Fall Risk  12/29/2020 12/05/2020  Falls in the past year? 1 1  Number falls in past yr: 1 1  Injury with Fall? 1 1  Risk for fall due to : Impaired balance/gait;Impaired mobility Impaired balance/gait;Impaired mobility;History of fall(s)  Follow up Falls evaluation completed Falls evaluation completed   PHQ 2/9 Scores 02/22/2021 01/09/2021 12/29/2020 12/05/2020 12/01/2020  PHQ - 2 Score 2 2 2 2 2   PHQ- 9 Score 13 5 6 6 6     Assessment:  Goals Addressed            This Visit's Progress   . Make and Keep All Appointments   On track    Timeframe:  Long-Range Goal Priority:  High Start Date:   11/23/20                          Expected End Date:   04/20/21                 Follow Up Date: 03/20/21    - call to cancel if needed - keep a calendar with appointment dates  Reinforced keeping all appointments    Why is this important?    Part of staying healthy is seeing the doctor for follow-up care.   If you forget your appointments, there are some things y. you can do to stay on  track.    Notes: Reviewed upcoming endocrinology appointment and transportation. Reviewed awaiting return call regarding progress on scat application. Encouraged to answer any received calls from agency. Patient verified receiving call from [facility] health transportation regarding pick up time for appointment .     01/21/21 Manage My Medicine   Not on track    Timeframe:  Long-Range Goal Priority:  Medium Start Date:   11/23/20                          Expected End Date:   05/20/21  Follow Up Date: 03/20/21   - call for medicine refill 2 or 3 days before it runs out - keep a list of all the medicines I take; vitamins and herbals too  Encouraged taking medications as prescribed.    Why is this important?   . These steps will help you keep on track with your medicines.   Notes: Patient reports having no incoming finances, roommate assist due to recent household . He reports plans for follow up with [facility] [place] center regarding medicaid /disability. . Unable to afford prescriptions. Encouraged to call to pharmacy regarding cost of prescription.explained [facility] not able to assist with copayment of medications .    Marland Kitchen Monitor and Manage My Blood Sugar-Diabetes Type 1   Not on track    Timeframe:  Long-Range Goal Priority:  High Start Date:   12/13/20                          Expected End Date:   05/20/21                   Follow Up Date 03/20/21   - check blood sugar if I feel it is too high or too low reinforced    Why is this important?    Checking your blood sugar at home helps to keep it from getting very high or very low.   Writing the results in a diary or log helps the doctor know how to care for you.   Your blood sugar log should have the time, the date and the results.   Also, write down the amount of insulin or other medicine you take.   Other information like what you ate, exercise done and how you were feeling will also be helpful..     Notes: Currently unable to  afford meter supplies to monitor blood sugars.     . Set My Target A1C   On track    Timeframe:  Short-Term Goal Priority:  Medium Start Date:   12/13/20                          Expected End Date:  01/18/21                     Follow Up Date : 03/20/21   - set target A1C    Why is this important?    Your target A1C is decided together by you and your doctor.   It is based on several things like your age and other health issues.    Notes:  Stressed  information on nutrition to help manage diabetes, encouraged limiting daily use of sweet soda, discussed foods to include in balanced meals, carbohydrates, protein sources, encouraged including green vegetables.         Plan:  Follow-up:  Patient agrees to Care Plan and Follow-up. Will plan follow up call in the next 2 weeks.     [name], RN, BSN  [facility] Care Management,Care Management Coordinator  [phone]- Mobile [phone]- Toll Free Main Office

## 2021-02-28 ENCOUNTER — Ambulatory Visit (INDEPENDENT_AMBULATORY_CARE_PROVIDER_SITE_OTHER): Payer: 59 | Admitting: Endocrinology

## 2021-02-28 ENCOUNTER — Other Ambulatory Visit: Payer: Self-pay

## 2021-02-28 VITALS — BP 142/76 | HR 89 | Ht 74.0 in | Wt 226.0 lb

## 2021-02-28 DIAGNOSIS — E1065 Type 1 diabetes mellitus with hyperglycemia: Secondary | ICD-10-CM

## 2021-02-28 MED ORDER — NOVOLIN 70/30 RELION (70-30) 100 UNIT/ML ~~LOC~~ SUSP: SUBCUTANEOUS | 11 refills | Status: DC

## 2021-02-28 NOTE — Progress Notes (Signed)
Subjective:    Patient ID: Mark [name], male    DOB: Jan 25, 1986, 35 y.o.   MRN: [id number]  HPI Pt returns for f/u of diabetes mellitus: DM type: 1 Dx'ed: 2011 Complications: PN Therapy: insulin since dx DKA:  last episode was in 2021 Severe hypoglycemia: last episode was in 2021 Pancreatitis: never Pancreatic imaging: normal on 2011 CT SDOH: He has h/o Noncompliance and leaving hosp AMA; he takes human insulin, due to cost Other: he declines to reduce insulin to BID Interval history: no cbg record, but states cbg's vary from 55-400.  It is in general higher as the day goes on.  pt states he feels well in general.   Past Medical History:  Diagnosis Date  . Anxiety   . Bipolar disorder (HCC)   . Depression   . Diabetes mellitus without complication (HCC)   . Diabetic polyneuropathy associated with type 1 diabetes mellitus (HCC)   . Hydrocephalus (HCC)   . Kidney stones   . Seizures (HCC)   . Truncal ataxia     Past Surgical History:  Procedure Laterality Date  . BRAIN SURGERY     hydrocephalus  . KIDNEY STONE SURGERY      Social History   Socioeconomic History  . Marital status: Single    Spouse name: Not on file  . Number of children: 0  . Years of education: 9th grade  . Highest education level: Not on file  Occupational History  . Occupation: seeking disability  Tobacco Use  . Smoking status: Current Every Day Smoker    Packs/day: 1.00    Years: 20.00    Pack years: 20.00    Types: Cigarettes  . Smokeless tobacco: Never Used  Vaping Use  . Vaping Use: Never used  Substance and Sexual Activity  . Alcohol use: Yes    Comment: occassional   . Drug use: Yes    Frequency: 7.0 times per week    Types: Marijuana    Comment: smoke daily  . Sexual activity: Not Currently  Other Topics Concern  . Not on file  Social History Narrative   Lives with a roommate.   Right-handed.   Drinks 2 liter of soda each day.   Social Determinants of Health    Financial Resource Strain: High Risk  . Difficulty of Paying Living Expenses: Very hard  Food Insecurity: Food Insecurity Present  . Worried About Programme researcher, broadcasting/film/video in the Last Year: Sometimes true  . Ran Out of Food in the Last Year: Sometimes true  Transportation Needs: Unmet Transportation Needs  . Lack of Transportation (Medical): Yes  . Lack of Transportation (Non-Medical): Yes  Physical Activity: Not on file  Stress: Not on file  Social Connections: Not on file  Intimate Partner Violence: Not on file    Current Outpatient Medications on File Prior to Visit  Medication Sig Dispense Refill  . acetaminophen (TYLENOL) 325 MG tablet Take 2 tablets (650 mg total) by mouth every 6 (six) hours as needed for mild pain, fever or headache.    . escitalopram (LEXAPRO) 10 MG tablet Take 1 tablet (10 mg total) by mouth daily. 30 tablet 1  . gabapentin (NEURONTIN) 100 MG capsule Take 1 capsule (100 mg total) by mouth 2 (two) times daily. 60 capsule 6  . glucose blood (FREESTYLE LITE) test strip 1 each by Other route in the morning, at noon, and at bedtime. And lancets 3/day 270 each 3  . Insulin Syringe-Needle U-100 30G X  1/2" 0.5 ML MISC USE AS DIRECTED 60 each 0  . Lancets MISC 1 application by Does not apply route daily. 100 each 0  . levothyroxine (SYNTHROID) 50 MCG tablet Take 1 tablet (50 mcg total) by mouth daily at 6 (six) AM. 30 tablet 6  . pyridOXINE (B-6) 50 MG tablet Take 1 tablet (50 mg total) by mouth daily. 30 tablet 6  . QUEtiapine (SEROQUEL) 100 MG tablet Take 1 tablet (100 mg total) by mouth daily. 30 tablet 1  . QUEtiapine (SEROQUEL) 200 MG tablet Take 1 tablet (200 mg total) by mouth at bedtime. 30 tablet 1  . rivaroxaban (XARELTO) 20 MG TABS tablet Take 1 tablet (20 mg total) by mouth daily with supper. 30 tablet 3   No current facility-administered medications on file prior to visit.    Allergies  Allergen Reactions  . Mushroom Extract Complex Hives, Itching and  Nausea And Vomiting  . Penicillins Anaphylaxis, Hives and Swelling    Has patient had a PCN reaction causing immediate rash, facial/tongue/throat swelling, SOB or lightheadedness with hypotension: Yes Has patient had a PCN reaction causing severe rash involving mucus membranes or skin necrosis: No Has patient had a PCN reaction that required hospitalization: Yes Has patient had a PCN reaction occurring within the last 10 years: Yes If all of the above answers are "NO", then may proceed with Cephalosporin use. Tolerated ceftriaxone 03/24/20  . Sulfa Antibiotics Anaphylaxis and Hives  . Clindamycin/Lincomycin Hives    Family History  Problem Relation Age of Onset  . Breast cancer Mother   . Other Father        unsure of medical history  . Diabetes Paternal Aunt     BP (!) 142/76 (BP Location: Right Arm, Patient Position: Sitting, Cuff Size: Large)   Pulse 89   Ht 6\' 2"  (1.88 m)   Wt 226 lb (102.5 kg)   SpO2 98%   BMI 29.02 kg/m    Review of Systems     Objective:   Physical Exam VITAL SIGNS:  See vs page GENERAL: no distress Pulses: dorsalis pedis intact bilat.   MSK: no deformity of the feet CV: no leg edema.   Skin:  no ulcer on the feet.  normal color and temp on the feet. Neuro: sensation is intact to touch on the feet, but decreased from normal Ext: there is bilateral onychomycosis of the toenails.       Assessment & Plan:  Type 1 DM Hypoglycemia, due to insulin: this limits aggressiveness of glycemic control.   Patient Instructions  check your blood sugar once a day.  vary the time of day when you check, between before the 3 meals, and at bedtime.  also check if you have symptoms of your blood sugar being too high or too low.  please keep a record of the readings and bring it to your next appointment here (or you can bring the meter itself).  You can write it on any piece of paper.  please call sooner if your blood sugar goes below 70, or if most of your  readings are over 200.  You should buy a meter and strips at [facility].   Please change the insulin to 3 times a day (just before each meal) 45-35-25 units.   Please come back for a follow-up appointment in 2 months.

## 2021-02-28 NOTE — Patient Instructions (Addendum)
check your blood sugar once a day.  vary the time of day when you check, between before the 3 meals, and at bedtime.  also check if you have symptoms of your blood sugar being too high or too low.  please keep a record of the readings and bring it to your next appointment here (or you can bring the meter itself).  You can write it on any piece of paper.  please call us sooner if your blood sugar goes below 70, or if most of your readings are over 200.  You should buy a meter and strips at [facility].   Please change the insulin to 3 times a day (just before each meal) 45-35-25 units.   Please come back for a follow-up appointment in 2 months.

## 2021-03-01 ENCOUNTER — Other Ambulatory Visit: Payer: Self-pay | Admitting: *Deleted

## 2021-03-01 NOTE — Patient Outreach (Signed)
[facility]) Care Management  03/01/2021  Mark [name] 1986-04-27 [id number]   Care Coordination    Referral received: 11/22/20 Referral source: [facility] Referral reason : 2 or more ED visits in January   Admissions in last 6 months 1 ED visits in last 6 months: 7  PMHX:  Diabetes type 1,insulin dependent, Right leg DVT, seizure disorder,hydoephalus ,anxiety, bipolar disorder, diabetic polyneuropathy   Subjective: Outreach call to [employer] to follow up on patient application for service. I was transferred to office of Mark [name] able to leave a HIPAA compliant voice mail message for return call.   Met patient on porch of his home to deliver gift card as approved by [facility] giving committee to support low income needs of patient recent higher than usual electric bill and to support patient getting ID card. Reviewed with patient requested use of card to support needs,patient&roommate Mark [name] questions if giftcard can be used to pay water bill on today, agreed if that is higher need now, Mark [name] agreeable.   Reinforced with patient regarding [facility] may have $0 copayment such as Seroquel, he agrees to contact.     Plan Will plan return call to patient in the next 2 weeks as scheduled.    Mark Draft, RN, BSN  [place] Management Coordinator  [phone]- Mobile [phone]- Toll Free Main Office

## 2021-03-02 ENCOUNTER — Ambulatory Visit (INDEPENDENT_AMBULATORY_CARE_PROVIDER_SITE_OTHER): Payer: 59 | Admitting: Neurology

## 2021-03-02 ENCOUNTER — Other Ambulatory Visit: Payer: Self-pay | Admitting: *Deleted

## 2021-03-02 ENCOUNTER — Encounter: Payer: Self-pay | Admitting: Neurology

## 2021-03-02 VITALS — BP 122/79 | HR 78 | Ht 74.0 in | Wt 229.0 lb

## 2021-03-02 DIAGNOSIS — F3160 Bipolar disorder, current episode mixed, unspecified: Secondary | ICD-10-CM

## 2021-03-02 DIAGNOSIS — E1042 Type 1 diabetes mellitus with diabetic polyneuropathy: Secondary | ICD-10-CM | POA: Diagnosis not present

## 2021-03-02 DIAGNOSIS — G919 Hydrocephalus, unspecified: Secondary | ICD-10-CM | POA: Diagnosis not present

## 2021-03-02 DIAGNOSIS — G40909 Epilepsy, unspecified, not intractable, without status epilepticus: Secondary | ICD-10-CM | POA: Diagnosis not present

## 2021-03-02 MED ORDER — DIVALPROEX SODIUM ER 250 MG PO TB24: 750.0000 mg | ORAL_TABLET | Freq: Every evening | ORAL | 0 refills | Status: DC

## 2021-03-02 MED ORDER — LAMOTRIGINE 100 MG PO TABS: ORAL_TABLET | ORAL | 11 refills | Status: DC

## 2021-03-02 NOTE — Progress Notes (Signed)
Chief Complaint  Patient presents with  . New Patient (Initial Visit)    He is here with his friend and roommate, [name]. Reports his first seizure was around 16-37 years old. His last seizure was in 2020. Currently on Depakote ER 500mg , three tabs in pm and gabapentin 100mg , one cap BID. Says his last seizure was in 2020. 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 Gait abnormality is progressively getting worse and has caused some falls. He is being treated for DVT - not taken Xarelto for the last two weeks.      ASSESSMENT AND PLAN  Mark [name] is a 35 y.o. male   Hydrocephalus  Status post VP shunt placement,  Patient is a poor historian, so is his friend,  Last imaging study was in November 2021,  which showed right frontal ventriculostomy site, no drainage catheter is identified, persistent marked hydrocephalus involving the third and lateral ventricle with normal fourth ventricle,  Patient and his friend reported worsening gait abnormality over the past few months, will proceed with MRI of the brain to rule out worsening hydrocephalus  Epilepsy  Difficulty compliant with his medications, due to insurance reasons per patient  After discussed with patient, we will stop Depakote ER, start lamotrigine, titrating to 100 mg twice a day,  EEG  Return to clinic in 3 months with 30  DIAGNOSTIC DATA (LABS, IMAGING, TESTING) - I reviewed patient records, labs, notes, testing and imaging myself where available.  [facility] brain at [facility] in 2017. 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   CT head without contrast November 2021 1. Persistent marked hydrocephalus involving the third and lateral ventricles with normal fourth ventricle. Findings consistent with aqueductal stenosis. 2. Right frontal ventriculostomy site. No drainage catheter is identified. 3. No acute intracranial findings or mass  lesions.  HISTORICAL  Mark [name] is a 35 year old male, seen in request by his primary care doctor [name], evaluation of epilepsy, and hydrocephalus, he is accompanied by his friend 28 at today's clinical visit on Mar 02, 2021, who has only know him for 1 year.  I reviewed and summarized the referring note. PMHX. Hydrocephalus, VP Shunt at age 21,  DM I  Hypothryodism Bipolar  DVT, right leg,  xarelto   Patient and his friend a poor historian, he now lives with his friend, not working, previously worked as a Mar 04, 2021 5 years ago, is applying for disability  I reviewed hospital discharge on September 30, 2020, he has past medical history of type 1 diabetes, bipolar disorder, epilepsy, hydrocephalus, tobacco abuse,  He was admitted to the hospital for seizure-like activity, mental status change, found to be in severe DKA with encephalopathy, requiring intubation and ventilation from November 12-14 2021, CT demonstrate marked hydrocephalus, with acute aqueduct stenosis, right frontal ventriculostomy was seen, but no drainage tube identified, neurosurgeon did not recommend any surgical intervention, he was also treated with aspiration pneumonia, He was evaluated by psychologist during his hospital admission, was deemed lacking of capacity to make medical decision, but did not meet criteria for inpatient behavioral health, eventually was discharged to home environment per patient's request  Multiple emergency room visit since hospital discharge in December, October 02, 2020, fatigue, weakness, near syncope, October 04, 2020, increased gait abnormality, right leg pain, history of right leg DVT, was getting prescription of Xarelto, November 03, 2020, hypoglycemia, neck pain, then left hospital AMA,  PHYSICAL EXAM:   Vitals:   03/02/21  1414  BP: 122/79  Pulse: 78  Weight: 229 lb (103.9 kg)  Height: 6\' 2"  (1.88 m)   Not recorded     Body mass index is 29.4  kg/m.  PHYSICAL EXAMNIATION:  Gen: NAD, conversant, well nourised, well groomed                     Cardiovascular: Regular rate rhythm, no peripheral edema, warm, nontender. Eyes: Conjunctivae clear without exudates or hemorrhage Neck: Supple, no carotid bruits. Pulmonary: Clear to auscultation bilaterally   NEUROLOGICAL EXAM:  MENTAL STATUS: Speech/cognition: Awake, alert, cooperative on examination, but poor historian   CRANIAL NERVES: CN II: Visual fields are full to confrontation. Pupils are round equal and briskly reactive to light. CN III, IV, VI: extraocular movement are normal. No ptosis. CN V: Facial sensation is intact to light touch CN VII: Face is symmetric with normal eye closure  CN VIII: Hearing is normal to causal conversation. CN IX, X: Phonation is normal. CN XI: Head turning and shoulder shrug are intact  MOTOR: Move bilateral upper extremity against gravity, moderate spasticity of bilateral lower extremity, mild to moderate bilateral hip flexion weakness  REFLEXES: Reflexes are 2+ and symmetric at the biceps, triceps, knees, and ankles. Plantar responses are extensor bilaterally  SENSORY: Intact to light touch, pinprick and vibratory sensation are intact in fingers and toes.  COORDINATION: There is no trunk or limb dysmetria noted.  GAIT/STANCE: Need push-up to get up from seated position, wide-based, spastic, unsteady gait Romberg is absent.  REVIEW OF SYSTEMS:  Full 14 system review of systems performed and notable only for as above All other review of systems were negative.   ALLERGIES: Allergies  Allergen Reactions  . Mushroom Extract Complex Hives, Itching and Nausea And Vomiting  . Penicillins Anaphylaxis, Hives and Swelling    Has patient had a PCN reaction causing immediate rash, facial/tongue/throat swelling, SOB or lightheadedness with hypotension: Yes Has patient had a PCN reaction causing severe rash involving mucus membranes or  skin necrosis: No Has patient had a PCN reaction that required hospitalization: Yes Has patient had a PCN reaction occurring within the last 10 years: Yes If all of the above answers are "NO", then may proceed with Cephalosporin use. Tolerated ceftriaxone 03/24/20  . Sulfa Antibiotics Anaphylaxis and Hives  . Clindamycin/Lincomycin Hives    HOME MEDICATIONS: Current Outpatient Medications  Medication Sig Dispense Refill  . acetaminophen (TYLENOL) 325 MG tablet Take 2 tablets (650 mg total) by mouth every 6 (six) hours as needed for mild pain, fever or headache.    . divalproex (DEPAKOTE ER) 250 MG 24 hr tablet Take 3 tablets (750 mg total) by mouth every evening. 90 tablet 1  . doxycycline (VIBRAMYCIN) 100 MG capsule TAKE 1 CAPSULE BY MOUTH 2 TIMES DAILY FOR 7 DAYS 14 capsule 0  . escitalopram (LEXAPRO) 10 MG tablet Take 1 tablet (10 mg total) by mouth daily. 30 tablet 1  . gabapentin (NEURONTIN) 100 MG capsule Take 1 capsule (100 mg total) by mouth 2 (two) times daily. 60 capsule 6  . glucose blood (FREESTYLE LITE) test strip 1 each by Other route in the morning, at noon, and at bedtime. And lancets 3/day 270 each 3  . insulin NPH-regular Human (NOVOLIN 70/30 RELION) (70-30) 100 UNIT/ML injection 3 times a day (just before each meal) 45-35-25 units, and syringes 1/day 40 mL 11  . Insulin Syringe-Needle U-100 30G X 1/2" 0.5 ML MISC USE AS DIRECTED 60 each 0  .  Lancets MISC 1 application by Does not apply route daily. 100 each 0  . levothyroxine (SYNTHROID) 50 MCG tablet Take 1 tablet (50 mcg total) by mouth daily at 6 (six) AM. 30 tablet 6  . pyridOXINE (B-6) 50 MG tablet Take 1 tablet (50 mg total) by mouth daily. 30 tablet 6  . QUEtiapine (SEROQUEL) 100 MG tablet Take 1 tablet (100 mg total) by mouth daily. 30 tablet 1  . QUEtiapine (SEROQUEL) 200 MG tablet Take 1 tablet (200 mg total) by mouth at bedtime. 30 tablet 1  . rivaroxaban (XARELTO) 20 MG TABS tablet Take 1 tablet (20 mg total) by  mouth daily with supper. 30 tablet 3   No current facility-administered medications for this visit.    PAST MEDICAL HISTORY: Past Medical History:  Diagnosis Date  . Anxiety   . Bipolar disorder (HCC)   . Depression   . Diabetes mellitus without complication (HCC)   . Diabetic polyneuropathy associated with type 1 diabetes mellitus (HCC)   . Hydrocephalus (HCC)   . Kidney stones   . Seizures (HCC)   . Truncal ataxia     PAST SURGICAL HISTORY: Past Surgical History:  Procedure Laterality Date  . BRAIN SURGERY     hydrocephalus  . KIDNEY STONE SURGERY      FAMILY HISTORY: Family History  Problem Relation Age of Onset  . Breast cancer Mother   . Other Father        unsure of medical history  . Diabetes Paternal Aunt     SOCIAL HISTORY: Social History   Socioeconomic History  . Marital status: Single    Spouse name: Not on file  . Number of children: 0  . Years of education: 9th grade  . Highest education level: Not on file  Occupational History  . Occupation: seeking disability  Tobacco Use  . Smoking status: Current Every Day Smoker    Packs/day: 1.00    Years: 20.00    Pack years: 20.00    Types: Cigarettes  . Smokeless tobacco: Never Used  Vaping Use  . Vaping Use: Never used  Substance and Sexual Activity  . Alcohol use: Yes    Comment: occassional   . Drug use: Yes    Frequency: 7.0 times per week    Types: Marijuana    Comment: smoke daily  . Sexual activity: Not Currently  Other Topics Concern  . Not on file  Social History Narrative   Lives with a roommate.   Right-handed.   Drinks 2 liter of soda each day.   Social Determinants of Health   Financial Resource Strain: High Risk  . Difficulty of Paying Living Expenses: Very hard  Food Insecurity: Food Insecurity Present  . Worried About Programme researcher, broadcasting/film/video in the Last Year: Sometimes true  . Ran Out of Food in the Last Year: Sometimes true  Transportation Needs: Unmet Transportation  Needs  . Lack of Transportation (Medical): Yes  . Lack of Transportation (Non-Medical): Yes  Physical Activity: Not on file  Stress: Not on file  Social Connections: Not on file  Intimate Partner Violence: Not on file    Total time spent reviewing the chart, obtaining history, examined patient, ordering tests, documentation, consultations and family, care coordination was 65 minutes     Levert Feinstein, M.D. Ph.D.  [name] [address] [place], [place] [address] Ph: [phone] Fax: [phone]  CC:  Arvilla Market, DO [address] [place],  [place]  [phone]  Arvilla MarketWallace, Catherine Lauren, DO

## 2021-03-02 NOTE — Patient Outreach (Signed)
[name]) Care Management  03/02/2021  Mark [name] 1986/08/25 [id number]   Care Coordination   Incoming return call from [employer], [employer] [place], [employer], in regarding to outreach call to her on yesterday on patient behalf.  Toni Amend verifies that she has received necessary information to review patient for services, a determination has not been made yet it could take up to 21 days. She states patient will receive a letter in the mail regarding determination .  She is agreeable to return follow up call in the next 2 weeks.   Plan Will continue patient follow up for care management coordination of care needs. Next visit scheduled in 2 weeks.    [name], RN, BSN  [facility] Care Management,Care Management Coordinator  [phone]- Mobile [phone]- Toll Free Main Office

## 2021-03-09 ENCOUNTER — Other Ambulatory Visit: Payer: Self-pay | Admitting: *Deleted

## 2021-03-09 NOTE — Patient Outreach (Addendum)
[facility]) Care Management  [facility] Care Manager  03/09/2021   Mark [name] 1986-07-10 [id number]  Subjective:   Outreach call to [employer], [employer] to follow up on patient application for services, able to leave a HIPAA compliant voicemail message for return call.   Received return call from [name], calling to state patient has been received full certification for transportation within city limits, curb to curb area at front of buildings and able to personal assistance rider for free.   Outreach call to patient to update on approval of [employer] [place] transportation, no answer able to leave a HIPAA compliant voicemail message.    Placed call to [facility] to thank for collaboration  with application for transportation and to follow up on patient Medicaid application. She reports continued submitting information as requested and follow up again in next month and update me.  She also reports receiving a call from patient on yesterday regarding needing ride to medical  appointment on next week on 5/25, she reports providing him contact number for [employer] [employer] to arrange a ride.     Plan Unsuccessful outreach to patient , will plan follow up call in next week as scheduled if no return call.  [name], RN, BSN  [facility] Care Management,Care Management Coordinator  [phone]- Mobile [phone]- Toll Free Main Office

## 2021-03-15 ENCOUNTER — Telehealth: Payer: Self-pay | Admitting: Neurology

## 2021-03-15 ENCOUNTER — Other Ambulatory Visit (HOSPITAL_COMMUNITY): Payer: Self-pay

## 2021-03-15 ENCOUNTER — Other Ambulatory Visit: Payer: Self-pay | Admitting: *Deleted

## 2021-03-15 ENCOUNTER — Other Ambulatory Visit: Payer: 59

## 2021-03-15 MED ORDER — LAMOTRIGINE 100 MG PO TABS
ORAL_TABLET | ORAL | 11 refills | Status: DC
Start: 2021-03-15 — End: 2021-03-22

## 2021-03-15 NOTE — Patient Outreach (Signed)
[facility]) Care Management  [facility] Care Manager  03/15/2021   Mark [name] 12/28/1985 [id number]    Telephone Assessment  Referral received: 11/22/20 Referral source: [facility] Referral reason : 2 or more ED visits in January   Admissions in last 6 months 1 ED visits in last 6 months: 7  PMHX:  Diabetes type 1,insulin dependent, Right leg DVT, seizure disorder,hydoephalus ,anxiety, bipolar disorder, diabetic polyneuropathy   Subjective:  Successful outreach call to patient , reports that he is doing okay.  Patient began to discuss having to reschedule appointment for EEG due to more wobbly and afraid he would fall. Stressed importance of follow up.  Patient states that he has not been able to get medication prescriptions filled to limits in finance, medicaid pending and disability has been filed for by assistance from [employer] at [employer].  He states he has not followed up on prescriptions that have no copayment such as seroquel. Discussed recent new prescriptions from neurology visit, states that he can't afford insurance copayments for medications.  Informed patient that he has been approved for full certification for [employer] [place] transportation, and roommate can ride with him for assistance provided contact number and he requested  assistance with arranging transportation for next appointment.    Objective  Encounter Medications:  Outpatient Encounter Medications as of 03/15/2021  Medication Sig  . acetaminophen (TYLENOL) 325 MG tablet Take 2 tablets (650 mg total) by mouth every 6 (six) hours as needed for mild pain, fever or headache.  . divalproex (DEPAKOTE ER) 250 MG 24 hr tablet Take 3 tablets (750 mg total) by mouth every evening.  . escitalopram (LEXAPRO) 10 MG tablet Take 1 tablet (10 mg total) by mouth daily.  Marland Kitchen gabapentin (NEURONTIN) 100 MG capsule Take 1 capsule (100 mg total) by mouth 2 (two) times daily.  Marland Kitchen glucose  blood (FREESTYLE LITE) test strip 1 each by Other route in the morning, at noon, and at bedtime. And lancets 3/day  . insulin NPH-regular Human (NOVOLIN 70/30 RELION) (70-30) 100 UNIT/ML injection 3 times a day (just before each meal) 45-35-25 units, and syringes 1/day  . Insulin Syringe-Needle U-100 30G X 1/2" 0.5 ML MISC USE AS DIRECTED  . lamoTRIgine (LAMICTAL) 100 MG tablet 1/2 once at night for one week,  1/2 twice a day for one week. 1/2 in am, 1 at night. One tab twice a day  . Lancets MISC 1 application by Does not apply route daily.  Marland Kitchen levothyroxine (SYNTHROID) 50 MCG tablet Take 1 tablet (50 mcg total) by mouth daily at 6 (six) AM.  . pyridOXINE (B-6) 50 MG tablet Take 1 tablet (50 mg total) by mouth daily.  . QUEtiapine (SEROQUEL) 100 MG tablet Take 1 tablet (100 mg total) by mouth daily.  . QUEtiapine (SEROQUEL) 200 MG tablet Take 1 tablet (200 mg total) by mouth at bedtime.  . rivaroxaban (XARELTO) 20 MG TABS tablet Take 1 tablet (20 mg total) by mouth daily with supper.   No facility-administered encounter medications on file as of 03/15/2021.    Functional Status:  In your present state of health, do you have any difficulty performing the following activities: 04/22/2020 04/10/2020  Hearing? N Y  Vision? N N  Difficulty concentrating or making decisions? N N  Walking or climbing stairs? Y N  Dressing or bathing? Y N  Doing errands, shopping? N N  Some recent data might be hidden    Fall/Depression Screening: Fall Risk  12/29/2020  12/05/2020  Falls in the past year? 1 1  Number falls in past yr: 1 1  Injury with Fall? 1 1  Risk for fall due to : Impaired balance/gait;Impaired mobility Impaired balance/gait;Impaired mobility;History of fall(s)  Follow up Falls evaluation completed Falls evaluation completed   [facility] 2/9 Scores 01/09/2021 12/29/2020 12/05/2020 12/01/2020  PHQ - 2 Score 2 2 2 2   PHQ- 9 Score 5 6 6 6   Some encounter information is confidential and restricted. Go  to Review Flowsheets activity to see all data.    Assessment:  Goals Addressed            This Visit's Progress   . Make and Keep All Appointments   On track    Timeframe:  Long-Range Goal Priority:  High Start Date:   11/23/20                          Expected End Date:   04/20/21                 Follow Up Date: 03/29/21    - call to cancel if needed - keep a calendar with appointment dates  Reinforced keeping all appointments    Why is this important?    Part of staying healthy is seeing the doctor for follow-up care.   If you forget your appointments, there are some things y. you can do to stay on track.   Barriers: Health Behaviors Knowledge Other, forgetful  Notes: Informed regarding approval for [employer] [place] transportation, provided contact number, placed  3 way call to arrange patient next transportation, can't be arranged prior to 7 days before appointment on 6/2, plan return call to patient on 5/26 to assist. Patient understands cost of $2 each way so total of $4 for trip, His roommate verifies they can afford.     . Manage My Medicine   Not on track    Timeframe:  Long-Range Goal Priority:  Medium Start Date:   11/23/20                          Expected End Date:   05/20/21                  Follow Up Date: 03/29/21   - call for medicine refill 2 or 3 days before it runs out - keep a list of all the medicines I take; vitamins and herbals too  Encouraged taking medications as prescribed.    Why is this important?   . 05/22/21 steps will help you keep on track with your medicines.  .Barriers: Financial  Notes: [facility] with patient [facility] way call to review copayment cost of medication, seroquel $0 copayment , Depakote $35, Lexapro $4, . Pharmacy states they do not have prescription for Lamictal and Xarelto. Placed call to [facility] pharmacy where prior prescription on file, they will [facility] with $coupon code. Placed call to Neurology [facility] regarding [facility] stating  no prescription for lamictal on file.     . Monitor and Manage My Blood Sugar-Diabetes Type 1   Not on track    Timeframe:  Long-Range Goal Priority:  High Start Date:   12/13/20                          Expected End Date:   05/20/21  Follow Up Date 03/20/21   - check blood sugar if I feel it is too high or too low reinforced    Why is this important?    Checking your blood sugar at home helps to keep it from getting very high or very low.   Writing the results in a diary or log helps the doctor know how to care for you.   Your blood sugar log should have the time, the date and the results.   Also, write down the amount of insulin or other medicine you take.   Other information like what you ate, exercise done and how you were feeling will also be helpful..    Barriers:  Financial    Notes: Does not have monitor for checking blood sugars     . Set My Target A1C   Not on track    Timeframe:  Short-Term Goal Priority:  Medium Start Date:   12/13/20                          Expected End Date:  01/18/21                     Follow Up Date : 03/29/21   - set target A1C    Why is this important?    Your target A1C is decided together by you and your doctor.   It is based on several things like your age and other health issues.    Notes:  Not working on this goal.         Plan:  Follow-up:  Patient agrees to Care Plan and Follow-up. Will plan care coordination call on next day to schedule transportation with [employer] [place] and follow up call in next 2 weeks.  [facility] that they do not have Xarelto once daily prescription on file. Call [facility] to follow up on Xarelto prescription they will [facility] with coupon code, potentially  $10 copayment, patient updated.  Will send this visit note to PCP.    [name], RN, BSN  [facility] Care Management,Care Management Coordinator  [phone]- Mobile [phone]- Toll Free Main  Office

## 2021-03-15 NOTE — Telephone Encounter (Signed)
Rx printed. Waiting on MD signature and then will fax to pharmacy

## 2021-03-15 NOTE — Telephone Encounter (Signed)
Faxed rx to pharmacy at [phone]. Received fax confirmation.

## 2021-03-15 NOTE — Telephone Encounter (Signed)
Pt's facility called stating that the pt's pharmacy has not received the prescription for his lamoTRIgine (LAMICTAL) 100 MG tablet Pt is needing [facility] [address] [place]

## 2021-03-16 ENCOUNTER — Other Ambulatory Visit: Payer: Self-pay | Admitting: *Deleted

## 2021-03-16 NOTE — Patient Outreach (Signed)
[facility]) Care Management  03/16/2021  Mark [name] 07-Jul-1986 [id number]   Care Coordination    Subjective: Successful outreach call to patient to assist with scheduling his travel arrangements with [employer] [place] for appointment at [facility] on 6/2.  Placed call with patient on line to [employer] [place] after waiting on phone  received message to leave a message regarding appointment date/time location and patient contact information.  Verified patient has contact number for [employer] [place] stored in his phone, understands that he has arrange rides 3 days before appointment and cost of ride $2 each way and cash needed.     Plan Will follow up with patient in next business day to confirm he has been able to schedule his first appointment with service.    [name], RN, BSN  [facility] Care Management,Care Management Coordinator  [phone]- Mobile [phone]- Toll Free Main Office

## 2021-03-17 ENCOUNTER — Other Ambulatory Visit: Payer: Self-pay | Admitting: *Deleted

## 2021-03-17 NOTE — Patient Outreach (Signed)
[facility]) Care Management  03/17/2021  Mark [name] [birth year] [id number]   Care Coordination    Subjective: Successful outreach call to patient to assist with scheduling his travel arrangements with [employer] [place] for appointment at [facility] on 6/2. With 3 way call with patient/roommate Mark [name], assisted patient with scheduling transportation for appointment on 6/2, teachback with arrival to home  pick up time window and [phone] and return transportation pick up, along cost , patient roommate to accompany him and reports writing down information.   Patient roommate Mark [name] agreeable to assist patient with scheduling next appointment with EEG at [facility] on 6/6 at 3:15 has again written done information .    Plan Will plan prior scheduled appointment in the next 2 weeks.  Reinforced patient to call sooner if new concerns or assistance.  [facility], roommate checking to see if he will be able to assist with copayment in the next week.    [name], RN, BSN  [facility] Care Management,Care Management Coordinator  [phone]- Mobile [phone]- Toll Free Main Office

## 2021-03-19 ENCOUNTER — Encounter (HOSPITAL_COMMUNITY): Payer: Self-pay | Admitting: Emergency Medicine

## 2021-03-19 ENCOUNTER — Other Ambulatory Visit: Payer: Self-pay

## 2021-03-19 ENCOUNTER — Emergency Department (HOSPITAL_COMMUNITY)
Admission: EM | Admit: 2021-03-19 | Discharge: 2021-03-19 | Disposition: A | Discharge status: Home / Self Care | Payer: 59 | Attending: Emergency Medicine | Admitting: Emergency Medicine

## 2021-03-19 DIAGNOSIS — F1510 Other stimulant abuse, uncomplicated: Secondary | ICD-10-CM

## 2021-03-19 DIAGNOSIS — Z20822 Contact with and (suspected) exposure to covid-19: Secondary | ICD-10-CM | POA: Insufficient documentation

## 2021-03-19 DIAGNOSIS — Z7901 Long term (current) use of anticoagulants: Secondary | ICD-10-CM | POA: Insufficient documentation

## 2021-03-19 DIAGNOSIS — F17210 Nicotine dependence, cigarettes, uncomplicated: Secondary | ICD-10-CM | POA: Insufficient documentation

## 2021-03-19 DIAGNOSIS — E039 Hypothyroidism, unspecified: Secondary | ICD-10-CM | POA: Insufficient documentation

## 2021-03-19 DIAGNOSIS — E1040 Type 1 diabetes mellitus with diabetic neuropathy, unspecified: Secondary | ICD-10-CM | POA: Insufficient documentation

## 2021-03-19 DIAGNOSIS — R110 Nausea: Secondary | ICD-10-CM | POA: Diagnosis present

## 2021-03-19 DIAGNOSIS — E860 Dehydration: Secondary | ICD-10-CM | POA: Insufficient documentation

## 2021-03-19 DIAGNOSIS — Z794 Long term (current) use of insulin: Secondary | ICD-10-CM | POA: Diagnosis not present

## 2021-03-19 DIAGNOSIS — Z79899 Other long term (current) drug therapy: Secondary | ICD-10-CM | POA: Diagnosis not present

## 2021-03-19 LAB — CBC WITH DIFFERENTIAL/PLATELET
Abs Immature Granulocytes: 0.03 10*3/uL (ref 0.00–0.07)
Basophils Absolute: 0.1 10*3/uL (ref 0.0–0.1)
Basophils Relative: 1 %
Eosinophils Absolute: 0.2 10*3/uL (ref 0.0–0.5)
Eosinophils Relative: 2 %
HCT: 49.9 % (ref 39.0–52.0)
Hemoglobin: 17.1 g/dL — ABNORMAL HIGH (ref 13.0–17.0)
Immature Granulocytes: 0 %
Lymphocytes Relative: 23 %
Lymphs Abs: 2.4 10*3/uL (ref 0.7–4.0)
MCH: 30.8 pg (ref 26.0–34.0)
MCHC: 34.3 g/dL (ref 30.0–36.0)
MCV: 89.9 fL (ref 80.0–100.0)
Monocytes Absolute: 0.7 10*3/uL (ref 0.1–1.0)
Monocytes Relative: 7 %
Neutro Abs: 6.9 10*3/uL (ref 1.7–7.7)
Neutrophils Relative %: 67 %
Platelets: 314 10*3/uL (ref 150–400)
RBC: 5.55 MIL/uL (ref 4.22–5.81)
RDW: 12.2 % (ref 11.5–15.5)
WBC: 10.2 10*3/uL (ref 4.0–10.5)
nRBC: 0 % (ref 0.0–0.2)

## 2021-03-19 LAB — COMPREHENSIVE METABOLIC PANEL
ALT: 16 U/L (ref 0–44)
AST: 31 U/L (ref 15–41)
Albumin: 5.5 g/dL — ABNORMAL HIGH (ref 3.5–5.0)
Alkaline Phosphatase: 105 U/L (ref 38–126)
Anion gap: 11 (ref 5–15)
BUN: 19 mg/dL (ref 6–20)
CO2: 31 mmol/L (ref 22–32)
Calcium: 10.6 mg/dL — ABNORMAL HIGH (ref 8.9–10.3)
Chloride: 99 mmol/L (ref 98–111)
Creatinine, Ser: 1.54 mg/dL — ABNORMAL HIGH (ref 0.61–1.24)
GFR, Estimated: 59 mL/min — ABNORMAL LOW (ref >60)
Glucose, Bld: 133 mg/dL — ABNORMAL HIGH (ref 70–99)
Potassium: 4.4 mmol/L (ref 3.5–5.1)
Sodium: 141 mmol/L (ref 135–145)
Total Bilirubin: 1.2 mg/dL (ref 0.3–1.2)
Total Protein: 9.6 g/dL — ABNORMAL HIGH (ref 6.5–8.1)

## 2021-03-19 LAB — URINALYSIS, ROUTINE W REFLEX MICROSCOPIC
Bacteria, UA: NONE SEEN
Bilirubin Urine: NEGATIVE
Glucose, UA: >=500 mg/dL — AB
Hgb urine dipstick: NEGATIVE
Ketones, ur: NEGATIVE mg/dL
Leukocytes,Ua: NEGATIVE
Nitrite: NEGATIVE
Protein, ur: 30 mg/dL — AB
Specific Gravity, Urine: 1.025 (ref 1.005–1.030)
pH: 5 (ref 5.0–8.0)

## 2021-03-19 LAB — CBG MONITORING, ED: Glucose-Capillary: 159 mg/dL — ABNORMAL HIGH (ref 70–99)

## 2021-03-19 LAB — BLOOD GAS, VENOUS
Acid-Base Excess: 5.2 mmol/L — ABNORMAL HIGH (ref 0.0–2.0)
Bicarbonate: 32 mmol/L — ABNORMAL HIGH (ref 20.0–28.0)
O2 Saturation: 34.7 %
Patient temperature: 98.6
pCO2, Ven: 56.4 mmHg (ref 44.0–60.0)
pH, Ven: 7.372 (ref 7.250–7.430)
pO2, Ven: <31 mmHg — CL (ref 32.0–45.0)

## 2021-03-19 LAB — RAPID URINE DRUG SCREEN, HOSP PERFORMED
Amphetamines: POSITIVE — AB
Barbiturates: NOT DETECTED
Benzodiazepines: NOT DETECTED
Cocaine: NOT DETECTED
Opiates: NOT DETECTED
Tetrahydrocannabinol: NOT DETECTED

## 2021-03-19 LAB — RESP PANEL BY RT-PCR (FLU A&B, COVID) ARPGX2
Influenza A by PCR: NEGATIVE
Influenza B by PCR: NEGATIVE
SARS Coronavirus 2 by RT PCR: NEGATIVE

## 2021-03-19 LAB — ETHANOL: Alcohol, Ethyl (B): <10 mg/dL (ref <10)

## 2021-03-19 LAB — LIPASE, BLOOD: Lipase: 20 U/L (ref 11–51)

## 2021-03-19 LAB — CK: Total CK: 133 U/L (ref 49–397)

## 2021-03-19 MED ORDER — ONDANSETRON 8 MG PO TBDP: 8.0000 mg | ORAL_TABLET | Freq: Three times a day (TID) | ORAL | 0 refills | Status: DC | PRN

## 2021-03-19 MED ORDER — "INSULIN SYRINGE-NEEDLE U-100 30G X 1/2"" 0.5 ML MISC": 0 refills | Status: DC

## 2021-03-19 MED ORDER — NOVOLIN 70/30 RELION (70-30) 100 UNIT/ML ~~LOC~~ SUSP: SUBCUTANEOUS | 0 refills | Status: DC

## 2021-03-19 MED ORDER — SODIUM CHLORIDE 0.9 % IV BOLUS
1000.0000 mL | Freq: Once | INTRAVENOUS | Status: AC
  Administered 2021-03-19: 1000 mL via INTRAVENOUS

## 2021-03-19 NOTE — ED Notes (Signed)
Patient given sandwich and water

## 2021-03-19 NOTE — ED Notes (Signed)
Patient came up to triage stating that he wasn't provided an accessible way home. Informed patient that if we do not have bus passes or taxi vouchers, we are unable to help provide a ride. Patient became aggressive and states, "So I'm stuck here?" Stated to patient he could use the phone in the lobby to call a ride. Patient irate and cursing at staff. Security escorted patient out of lobby.

## 2021-03-19 NOTE — ED Provider Notes (Signed)
[facility]-EMERGENCY DEPT Provider Note   CSN: [id number] Arrival date & time: 03/19/21  1513     History Chief Complaint  Patient presents with  . Fatigue  . Nausea  . Abdominal Pain    Mark [name] is a 35 y.o. male.  HPI   Patient presents to the ED for evaluation of nausea and weakness.  Patient states he has history of diabetes.  He has been out of his insulin for the last couple of days.  He was trying to walk to the store to get something to drink and he started to feel weak all over and nauseated.  He ended up calling EMS.  Patient reported abdominal pain earlier but he denies any abdominal pain to me right now.  He denies any fevers.  No diarrhea.  He denies any focal numbness or weakness.  No trouble with his speech.  No blurred vision.  Past Medical History:  Diagnosis Date  . Anxiety   . Bipolar disorder (HCC)   . Depression   . Diabetes mellitus without complication (HCC)   . Diabetic polyneuropathy associated with type 1 diabetes mellitus (HCC)   . Hydrocephalus (HCC)   . Kidney stones   . Seizures (HCC)   . Truncal ataxia     Patient Active Problem List   Diagnosis Date Noted  . Hydrocephalus (HCC) 03/02/2021  . Bipolar affective disorder, current episode mixed (HCC) 02/24/2021  . Generalized anxiety disorder 02/24/2021  . History of DVT of lower extremity 12/07/2020  . Right leg DVT (HCC) 11/03/2020  . Insomnia due to other mental disorder   . Bipolar I disorder (HCC)   . Uncontrolled type 1 diabetes mellitus with hyperglycemia (HCC)   . Seizures (HCC)   . Bipolar I disorder, current or most recent episode depressed, in partial remission (HCC)   . MDD (major depressive disorder), recurrent episode, severe (HCC) 03/13/2020  . Drug overdose, intentional (HCC) 03/05/2020  . Drug abuse (HCC) 03/05/2020  . Attempted suicide (HCC) 03/05/2020  . Insulin overdose   . Hypothyroidism 02/02/2020  . Diabetic polyneuropathy associated  with type 1 diabetes mellitus (HCC) 02/02/2020  . Homelessness 12/29/2019  . Marijuana abuse 12/29/2019  . Tobacco dependence 12/29/2019  . Seizure disorder (HCC) 12/29/2019  . Suicidal ideation   . Bipolar I disorder, most recent episode depressed, severe without psychotic features (HCC)   . Adjustment disorder with depressed mood 12/07/2017  . Dysthymia 10/08/2017  . Personality disorder in adult [facility]) 09/26/2017  . 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 Obstructive hydrocephalus (HCC) 09/19/2017  . MDD (major depressive disorder) 08/22/2017  . Severe recurrent major depression with psychotic features (HCC) 08/19/2017  . Hyponatremia 08/18/2017    Past Surgical History:  Procedure Laterality Date  . BRAIN SURGERY     hydrocephalus  . KIDNEY STONE SURGERY         Family History  Problem Relation Age of Onset  . Breast cancer Mother   . Other Father        unsure of medical history  . Diabetes Paternal Aunt     Social History   Tobacco Use  . Smoking status: Current Every Day Smoker    Packs/day: 1.00    Years: 20.00    Pack years: 20.00    Types: Cigarettes  . Smokeless tobacco: Never Used  Vaping Use  . Vaping Use: Never used  Substance Use Topics  . Alcohol use: Yes    Comment: occassional   . Drug  use: Yes    Frequency: 7.0 times per week    Types: Marijuana    Comment: smoke daily    Home Medications Prior to Admission medications   Medication Sig Start Date End Date Taking? Authorizing Provider  acetaminophen (TYLENOL) 325 MG tablet Take 2 tablets (650 mg total) by mouth every 6 (six) hours as needed for mild pain, fever or headache. 09/30/20  Yes Russella DarEllis, Allison L, NP  divalproex (DEPAKOTE ER) 250 MG 24 hr tablet Take 3 tablets (750 mg total) by mouth every evening. 03/02/21 03/02/22 Yes Levert FeinsteinYan, Yijun, MD  escitalopram (LEXAPRO) 10 MG tablet Take 1 tablet (10 mg total) by mouth daily. 02/24/21 02/24/22 Yes Nwoko, Stephens ShireUchenna E, PA  gabapentin (NEURONTIN) 100 MG capsule Take  1 capsule (100 mg total) by mouth 2 (two) times daily. 09/30/20  Yes Russella DarEllis, Allison L, NP  levothyroxine (SYNTHROID) 50 MCG tablet Take 1 tablet (50 mcg total) by mouth daily at 6 (six) AM. 10/01/20  Yes Russella DarEllis, Allison L, NP  ondansetron (ZOFRAN ODT) 8 MG disintegrating tablet Take 1 tablet (8 mg total) by mouth every 8 (eight) hours as needed for nausea or vomiting. 03/19/21  Yes Linwood DibblesKnapp, Jevonte Clanton, MD  QUEtiapine (SEROQUEL) 100 MG tablet Take 1 tablet (100 mg total) by mouth daily. 02/24/21 02/24/22 Yes Nwoko, Tommas OlpUchenna E, PA  QUEtiapine (SEROQUEL) 200 MG tablet Take 1 tablet (200 mg total) by mouth at bedtime. Patient taking differently: Take 400 mg by mouth at bedtime. 02/24/21 02/24/22 Yes Nwoko, Tommas OlpUchenna E, PA  rivaroxaban (XARELTO) 20 MG TABS tablet Take 1 tablet (20 mg total) by mouth daily with supper. 01/23/21  Yes Arvilla MarketWallace, Catherine Lauren, DO  glucose blood (FREESTYLE LITE) test strip 1 each by Other route in the morning, at noon, and at bedtime. And lancets 3/day 01/16/21   Romero BellingEllison, Sean, MD  insulin NPH-regular Human (NOVOLIN 70/30 RELION) (70-30) 100 UNIT/ML injection 3 times a day (just before each meal) 45-35-25 units, and syringes 1/day 03/19/21   Linwood DibblesKnapp, Kailynne Ferrington, MD  Insulin Syringe-Needle U-100 30G X 1/2" 0.5 ML MISC USE AS DIRECTED 03/19/21 03/19/22  Linwood DibblesKnapp, Giannina Bartolome, MD  lamoTRIgine (LAMICTAL) 100 MG tablet 1/2 once at night for one week,  1/2 twice a day for one week. 1/2 in am, 1 at night. One tab twice a day Patient not taking: No sig reported 03/15/21   Asa LenteSater, Richard A, MD  Lancets MISC 1 application by Does not apply route daily. 12/29/20   Arvilla MarketWallace, Catherine Lauren, DO  pyridOXINE (B-6) 50 MG tablet Take 1 tablet (50 mg total) by mouth daily. Patient not taking: No sig reported 10/01/20   Russella DarEllis, Allison L, NP    Allergies    Mushroom extract complex, Penicillins, Sulfa antibiotics, and Clindamycin/lincomycin  Review of Systems   Review of Systems  All other systems reviewed and are  negative.   Physical Exam Updated Vital Signs BP 128/88   Pulse 84   Temp 98.2 F (36.8 C) (Oral)   Resp (!) 25   SpO2 100%   Physical Exam Vitals and nursing note reviewed.  Constitutional:      Appearance: He is well-developed. He is not diaphoretic.  HENT:     Head: Normocephalic and atraumatic.     Right Ear: External ear normal.     Left Ear: External ear normal.  Eyes:     General: No scleral icterus.       Right eye: No discharge.        Left eye: No discharge.  Conjunctiva/sclera: Conjunctivae normal.  Neck:     Trachea: No tracheal deviation.  Cardiovascular:     Rate and Rhythm: Normal rate and regular rhythm.  Pulmonary:     Effort: Pulmonary effort is normal. No respiratory distress.     Breath sounds: Normal breath sounds. No stridor. No wheezing or rales.  Abdominal:     General: Bowel sounds are normal. There is no distension.     Palpations: Abdomen is soft.     Tenderness: There is no abdominal tenderness. There is no guarding or rebound.  Musculoskeletal:        General: No tenderness.     Cervical back: Neck supple.  Skin:    General: Skin is warm and dry.     Findings: No rash.  Neurological:     Mental Status: He is alert.     Cranial Nerves: No cranial nerve deficit (no facial droop, extraocular movements intact, no slurred speech).     Sensory: No sensory deficit.     Motor: No abnormal muscle tone or seizure activity.     Coordination: Coordination normal.     Comments: 5 out of 5 strength bilateral upper extremities and lower extremities, normal sensation throughout, no facial droop, normal speech     ED Results / Procedures / Treatments   Labs (all labs ordered are listed, but only abnormal results are displayed) Labs Reviewed  COMPREHENSIVE METABOLIC PANEL - Abnormal; Notable for the following components:      Result Value   Glucose, Bld 133 (*)    Creatinine, Ser 1.54 (*)    Calcium 10.6 (*)    Total Protein 9.6 (*)     Albumin 5.5 (*)    GFR, Estimated 59 (*)    All other components within normal limits  CBC WITH DIFFERENTIAL/PLATELET - Abnormal; Notable for the following components:   Hemoglobin 17.1 (*)    All other components within normal limits  URINALYSIS, ROUTINE W REFLEX MICROSCOPIC - Abnormal; Notable for the following components:   Glucose, UA >=500 (*)    Protein, ur 30 (*)    All other components within normal limits  BLOOD GAS, VENOUS - Abnormal; Notable for the following components:   pO2, Ven <31.0 (*)    Bicarbonate 32.0 (*)    Acid-Base Excess 5.2 (*)    All other components within normal limits  CBG MONITORING, ED - Abnormal; Notable for the following components:   Glucose-Capillary 159 (*)    All other components within normal limits  RESP PANEL BY RT-PCR (FLU A&B, COVID) ARPGX2  LIPASE, BLOOD  CK  ETHANOL  RAPID URINE DRUG SCREEN, HOSP PERFORMED    EKG EKG Interpretation  Date/Time:  Sunday Mar 19 2021 16:58:07 EDT Ventricular Rate:  81 PR Interval:  157 QRS Duration: 80 QT Interval:  355 QTC Calculation: 412 R Axis:   60 Text Interpretation: Sinus rhythm Consider right atrial enlargement ST elev, probable normal early repol pattern No significant change since last tracing Confirmed by Linwood Dibbles [phone]) on 03/19/2021 5:01:27 PM   Radiology No results found.  Procedures Procedures   Medications Ordered in ED Medications  sodium chloride 0.9 % bolus 1,000 mL (0 mLs Intravenous Stopped 03/19/21 1905)    ED Course  I have reviewed the triage vital signs and the nursing notes.  Pertinent labs & imaging results that were available during my care of the patient were reviewed by me and considered in my medical decision making (see chart for details).  Clinical Course as of 03/19/21 2039  Sun Mar 19, 2021  1821 Venous blood gas does not show evidence of acidosis CBC is normal [JK]  1846 CK normal.  Lipase  normal [JK]  1846 Creatinine increased compared to  previous [JK]  2037 Urinalysis without signs of infection [JK]    Clinical Course User Index [JK] Linwood Dibbles, MD   MDM Rules/Calculators/A&P                          Patient presented to the ED for evaluation of nausea vomiting and abdominal pain.  In the ED the patient's abdominal exam was benign.  He did not have any further episodes of nausea vomiting.  Patient was treated with IV fluids and antiemetics.  His symptoms improved.  Patient did not have any evidence of DKA.  No signs of hepatitis or pancreatitis.  He did appear mildly dehydrated on his laboratory testing he was given IV fluids.  At this point patient is stable for discharge.  I will give him a refill of his insulin Final Clinical Impression(s) / ED Diagnoses Final diagnoses:  Dehydration    Rx / DC Orders ED Discharge Orders         Ordered    insulin NPH-regular Human (NOVOLIN 70/30 RELION) (70-30) 100 UNIT/ML injection        03/19/21 2038    Insulin Syringe-Needle U-100 30G X 1/2" 0.5 ML MISC  See admin instructions        03/19/21 2038    ondansetron (ZOFRAN ODT) 8 MG disintegrating tablet  Every 8 hours PRN        03/19/21 2039           Linwood Dibbles, MD 03/19/21 2040

## 2021-03-19 NOTE — Discharge Instructions (Addendum)
Continue your diabetes medications.  Follow-up with your primary care doctor to be rechecked

## 2021-03-19 NOTE — ED Triage Notes (Addendum)
Per EMS, patient from sidewalk, was walking to store from house to get a soda when he started feeling nauseous and fatigued. C/o abdominal pain.  States he has been without insulin x2 days. CBG 185. Patient angry and aggressive with EMS.

## 2021-03-19 NOTE — ED Notes (Signed)
Pt became very agitated upon triage questions due to not being able to have any water at this time and too many questions being asked.

## 2021-03-19 NOTE — ED Provider Notes (Signed)
Emergency Medicine Provider Triage Evaluation Note  JGUADALUPE OPIELA , a 35 y.o. male  was evaluated in triage.  Pt complains of nv, abd pain and ble pain.  Review of Systems  Positive: abd pain, nv, leg pain Negative: Chest pain, sob, fever  Physical Exam  BP 99/75 (BP Location: Left Arm)   Pulse (!) 125   Temp 98.2 F (36.8 C) (Oral)   Resp 18   SpO2 96%  Gen:   Awake, no distress   Resp:  Normal effort  MSK:   Moves extremities without difficulty  Other:  abd soft and nontender, heart with rrr  Medical Decision Making  Medically screening exam initiated at 4:17 PM.  Appropriate orders placed.  [name] was informed that the remainder of the evaluation will be completed by another provider, this initial triage assessment does not replace that evaluation, and the importance of remaining in the ED until their evaluation is complete.     Rayne Du 03/19/21 1619    Linwood Dibbles, MD 03/20/21 [phone]

## 2021-03-21 ENCOUNTER — Other Ambulatory Visit: Payer: Self-pay

## 2021-03-21 ENCOUNTER — Encounter (HOSPITAL_COMMUNITY): Payer: Self-pay | Admitting: Emergency Medicine

## 2021-03-21 ENCOUNTER — Observation Stay (HOSPITAL_COMMUNITY)
Admission: EM | Admit: 2021-03-21 | Discharge: 2021-03-22 | Disposition: A | Discharge status: Home / Self Care | Payer: 59 | Attending: Internal Medicine | Admitting: Internal Medicine

## 2021-03-21 DIAGNOSIS — E1065 Type 1 diabetes mellitus with hyperglycemia: Principal | ICD-10-CM | POA: Diagnosis present

## 2021-03-21 DIAGNOSIS — F99 Mental disorder, not otherwise specified: Secondary | ICD-10-CM

## 2021-03-21 DIAGNOSIS — Z79899 Other long term (current) drug therapy: Secondary | ICD-10-CM | POA: Diagnosis not present

## 2021-03-21 DIAGNOSIS — Z794 Long term (current) use of insulin: Secondary | ICD-10-CM | POA: Insufficient documentation

## 2021-03-21 DIAGNOSIS — R569 Unspecified convulsions: Secondary | ICD-10-CM

## 2021-03-21 DIAGNOSIS — E039 Hypothyroidism, unspecified: Secondary | ICD-10-CM | POA: Diagnosis not present

## 2021-03-21 DIAGNOSIS — G919 Hydrocephalus, unspecified: Secondary | ICD-10-CM | POA: Diagnosis present

## 2021-03-21 DIAGNOSIS — R739 Hyperglycemia, unspecified: Secondary | ICD-10-CM | POA: Diagnosis present

## 2021-03-21 DIAGNOSIS — N179 Acute kidney failure, unspecified: Secondary | ICD-10-CM | POA: Insufficient documentation

## 2021-03-21 DIAGNOSIS — F17210 Nicotine dependence, cigarettes, uncomplicated: Secondary | ICD-10-CM | POA: Diagnosis not present

## 2021-03-21 DIAGNOSIS — G9340 Encephalopathy, unspecified: Secondary | ICD-10-CM

## 2021-03-21 DIAGNOSIS — F5105 Insomnia due to other mental disorder: Secondary | ICD-10-CM

## 2021-03-21 DIAGNOSIS — F3160 Bipolar disorder, current episode mixed, unspecified: Secondary | ICD-10-CM

## 2021-03-21 DIAGNOSIS — F411 Generalized anxiety disorder: Secondary | ICD-10-CM

## 2021-03-21 DIAGNOSIS — I619 Nontraumatic intracerebral hemorrhage, unspecified: Secondary | ICD-10-CM

## 2021-03-21 DIAGNOSIS — G40909 Epilepsy, unspecified, not intractable, without status epilepticus: Secondary | ICD-10-CM

## 2021-03-21 LAB — CBC WITH DIFFERENTIAL/PLATELET
Abs Immature Granulocytes: 0.02 10*3/uL (ref 0.00–0.07)
Basophils Absolute: 0 10*3/uL (ref 0.0–0.1)
Basophils Relative: 1 %
Eosinophils Absolute: 0.2 10*3/uL (ref 0.0–0.5)
Eosinophils Relative: 3 %
HCT: 39.2 % (ref 39.0–52.0)
Hemoglobin: 13.5 g/dL (ref 13.0–17.0)
Immature Granulocytes: 0 %
Lymphocytes Relative: 20 %
Lymphs Abs: 1.6 10*3/uL (ref 0.7–4.0)
MCH: 31.2 pg (ref 26.0–34.0)
MCHC: 34.4 g/dL (ref 30.0–36.0)
MCV: 90.5 fL (ref 80.0–100.0)
Monocytes Absolute: 0.4 10*3/uL (ref 0.1–1.0)
Monocytes Relative: 4 %
Neutro Abs: 5.8 10*3/uL (ref 1.7–7.7)
Neutrophils Relative %: 72 %
Platelets: 247 10*3/uL (ref 150–400)
RBC: 4.33 MIL/uL (ref 4.22–5.81)
RDW: 11.9 % (ref 11.5–15.5)
WBC: 8.1 10*3/uL (ref 4.0–10.5)
nRBC: 0 % (ref 0.0–0.2)

## 2021-03-21 LAB — BLOOD GAS, VENOUS
Acid-base deficit: 3.1 mmol/L — ABNORMAL HIGH (ref 0.0–2.0)
Bicarbonate: 22.4 mmol/L (ref 20.0–28.0)
O2 Saturation: 49.3 %
Patient temperature: 98.6
pCO2, Ven: 44.2 mmHg (ref 44.0–60.0)
pH, Ven: 7.325 (ref 7.250–7.430)
pO2, Ven: 28.8 mmHg — CL (ref 32.0–45.0)

## 2021-03-21 LAB — COMPREHENSIVE METABOLIC PANEL
ALT: 14 U/L (ref 0–44)
AST: 17 U/L (ref 15–41)
Albumin: 4.3 g/dL (ref 3.5–5.0)
Alkaline Phosphatase: 82 U/L (ref 38–126)
Anion gap: 9 (ref 5–15)
BUN: 18 mg/dL (ref 6–20)
CO2: 22 mmol/L (ref 22–32)
Calcium: 8.7 mg/dL — ABNORMAL LOW (ref 8.9–10.3)
Chloride: 97 mmol/L — ABNORMAL LOW (ref 98–111)
Creatinine, Ser: 1.11 mg/dL (ref 0.61–1.24)
GFR, Estimated: >60 mL/min (ref >60)
Glucose, Bld: 748 mg/dL (ref 70–99)
Potassium: 4.5 mmol/L (ref 3.5–5.1)
Sodium: 128 mmol/L — ABNORMAL LOW (ref 135–145)
Total Bilirubin: 1.5 mg/dL — ABNORMAL HIGH (ref 0.3–1.2)
Total Protein: 7 g/dL (ref 6.5–8.1)

## 2021-03-21 LAB — URINALYSIS, ROUTINE W REFLEX MICROSCOPIC
Bacteria, UA: NONE SEEN
Bilirubin Urine: NEGATIVE
Glucose, UA: >=500 mg/dL — AB
Hgb urine dipstick: NEGATIVE
Ketones, ur: 20 mg/dL — AB
Leukocytes,Ua: NEGATIVE
Nitrite: NEGATIVE
Protein, ur: NEGATIVE mg/dL
Specific Gravity, Urine: 1.026 (ref 1.005–1.030)
pH: 6 (ref 5.0–8.0)

## 2021-03-21 LAB — CBG MONITORING, ED
Glucose-Capillary: >600 mg/dL (ref 70–99)
Glucose-Capillary: >600 mg/dL (ref 70–99)
Glucose-Capillary: >600 mg/dL (ref 70–99)

## 2021-03-21 MED ORDER — INSULIN REGULAR(HUMAN) IN NACL 100-0.9 UT/100ML-% IV SOLN
INTRAVENOUS | Status: DC
Start: 2021-03-21 — End: 2021-03-21
  Filled 2021-03-21: qty 100

## 2021-03-21 MED ORDER — DEXTROSE 50 % IV SOLN: 0.0000 mL | INTRAVENOUS | Status: DC | PRN

## 2021-03-21 MED ORDER — SODIUM CHLORIDE 0.9 % IV BOLUS
1000.0000 mL | Freq: Once | INTRAVENOUS | Status: AC
  Administered 2021-03-21: 1000 mL via INTRAVENOUS

## 2021-03-21 MED ORDER — ONDANSETRON HCL 4 MG/2ML IJ SOLN
4.0000 mg | Freq: Once | INTRAMUSCULAR | Status: AC
  Administered 2021-03-21: 4 mg via INTRAVENOUS
  Filled 2021-03-21: qty 2

## 2021-03-21 MED ORDER — DEXTROSE IN LACTATED RINGERS 5 % IV SOLN: INTRAVENOUS | Status: DC

## 2021-03-21 MED ORDER — POTASSIUM CHLORIDE 10 MEQ/100ML IV SOLN
10.0000 meq | INTRAVENOUS | Status: AC
  Administered 2021-03-22 (×2): 10 meq via INTRAVENOUS
  Filled 2021-03-21 (×2): qty 100

## 2021-03-21 MED ORDER — INSULIN REGULAR(HUMAN) IN NACL 100-0.9 UT/100ML-% IV SOLN
INTRAVENOUS | Status: DC
  Administered 2021-03-22: 13 [IU]/h via INTRAVENOUS

## 2021-03-21 MED ORDER — INSULIN ASPART 100 UNIT/ML IJ SOLN
10.0000 [IU] | Freq: Once | INTRAMUSCULAR | Status: AC
  Administered 2021-03-21: 10 [IU] via INTRAVENOUS
  Filled 2021-03-21: qty 0.1

## 2021-03-21 MED ORDER — LACTATED RINGERS IV SOLN: INTRAVENOUS | Status: DC

## 2021-03-21 MED ORDER — SODIUM CHLORIDE 0.9 % IV SOLN: Freq: Once | INTRAVENOUS | Status: DC

## 2021-03-21 MED ORDER — LACTATED RINGERS IV BOLUS
20.0000 mL/kg | Freq: Once | INTRAVENOUS | Status: AC
  Administered 2021-03-22: 2078 mL via INTRAVENOUS

## 2021-03-21 MED ORDER — INSULIN ASPART 100 UNIT/ML IJ SOLN
10.0000 [IU] | Freq: Once | INTRAMUSCULAR | Status: AC
  Administered 2021-03-21: 10 [IU] via SUBCUTANEOUS
  Filled 2021-03-21: qty 0.1

## 2021-03-21 NOTE — H&P (Signed)
History and Physical    Mark JackMichael Q Matt WUJ:[id number] DOB: 01/15/1986 DOA: 03/21/2021  PCP: Arvilla MarketWallace, Catherine Lauren, DO  Patient coming from: Home.  Chief Complaint: Elevated blood sugar.  HPI: Mark [name] is a 35 y.o. male with history of diabetes mellitus type 1, seizure disorder, bipolar disorder, hypothyroidism.,  Hydrocephalus status post VP shunt presents to the ER because of elevated blood sugar.  Patient states he has ran out of his insulin last few days and has been having elevated blood sugar.  Has some nausea vomiting denies abdominal pain or diarrhea.  In addition patient also has not taken any of his other medications for at least 2 weeks.  ED Course: In the ER patient is found to have blood sugar of 748 with anion gap of 9.  Patient was started on fluids and insulin drip and admitted for further observation.  COVID test was negative.  Review of Systems: As per HPI, rest all negative.   Past Medical History:  Diagnosis Date  . Anxiety   . Bipolar disorder (HCC)   . Depression   . Diabetes mellitus without complication (HCC)   . Diabetic polyneuropathy associated with type 1 diabetes mellitus (HCC)   . Hydrocephalus (HCC)   . Kidney stones   . Seizures (HCC)   . Truncal ataxia     Past Surgical History:  Procedure Laterality Date  . BRAIN SURGERY     hydrocephalus  . KIDNEY STONE SURGERY       reports that he has been smoking cigarettes. He has a 20.00 pack-year smoking history. He has never used smokeless tobacco. He reports current alcohol use. He reports current drug use. Frequency: 7.00 times per week. Drug: Marijuana.  Allergies  Allergen Reactions  . Mushroom Extract Complex Hives, Itching and Nausea And Vomiting  . Penicillins Anaphylaxis, Hives and Swelling    Has patient had a PCN reaction causing immediate rash, facial/tongue/throat swelling, SOB or lightheadedness with hypotension: Yes Has patient had a PCN reaction causing severe rash  involving mucus membranes or skin necrosis: No Has patient had a PCN reaction that required hospitalization: Yes Has patient had a PCN reaction occurring within the last 10 years: Yes If all of the above answers are "NO", then may proceed with Cephalosporin use. Tolerated ceftriaxone 03/24/20  . Sulfa Antibiotics Anaphylaxis and Hives  . Clindamycin/Lincomycin Hives    Family History  Problem Relation Age of Onset  . Breast cancer Mother   . Other Father        unsure of medical history  . Diabetes Paternal Aunt     Prior to Admission medications   Medication Sig Start Date End Date Taking? Authorizing Provider  acetaminophen (TYLENOL) 325 MG tablet Take 2 tablets (650 mg total) by mouth every 6 (six) hours as needed for mild pain, fever or headache. 09/30/20   Russella DarEllis, Allison L, NP  divalproex (DEPAKOTE ER) 250 MG 24 hr tablet Take 3 tablets (750 mg total) by mouth every evening. 03/02/21 03/02/22  Levert FeinsteinYan, Yijun, MD  escitalopram (LEXAPRO) 10 MG tablet Take 1 tablet (10 mg total) by mouth daily. 02/24/21 02/24/22  Nwoko, Tommas OlpUchenna E, PA  gabapentin (NEURONTIN) 100 MG capsule Take 1 capsule (100 mg total) by mouth 2 (two) times daily. 09/30/20   Russella DarEllis, Allison L, NP  glucose blood (FREESTYLE LITE) test strip 1 each by Other route in the morning, at noon, and at bedtime. And lancets 3/day 01/16/21   Romero BellingEllison, Sean, MD  insulin NPH-regular Human (  NOVOLIN 70/30 RELION) (70-30) 100 UNIT/ML injection 3 times a day (just before each meal) 45-35-25 units, and syringes 1/day 03/19/21   Linwood Dibbles, MD  Insulin Syringe-Needle U-100 30G X 1/2" 0.5 ML MISC USE AS DIRECTED 03/19/21 03/19/22  Linwood Dibbles, MD  lamoTRIgine (LAMICTAL) 100 MG tablet 1/2 once at night for one week,  1/2 twice a day for one week. 1/2 in am, 1 at night. One tab twice a day Patient not taking: No sig reported 03/15/21   Asa Lente, MD  Lancets MISC 1 application by Does not apply route daily. 12/29/20   Arvilla Market, DO   levothyroxine (SYNTHROID) 50 MCG tablet Take 1 tablet (50 mcg total) by mouth daily at 6 (six) AM. 10/01/20   Russella Dar, NP  ondansetron (ZOFRAN ODT) 8 MG disintegrating tablet Take 1 tablet (8 mg total) by mouth every 8 (eight) hours as needed for nausea or vomiting. 03/19/21   Linwood Dibbles, MD  pyridOXINE (B-6) 50 MG tablet Take 1 tablet (50 mg total) by mouth daily. Patient not taking: No sig reported 10/01/20   Russella Dar, NP  QUEtiapine (SEROQUEL) 100 MG tablet Take 1 tablet (100 mg total) by mouth daily. 02/24/21 02/24/22  Nwoko, Tommas Olp, PA  QUEtiapine (SEROQUEL) 200 MG tablet Take 1 tablet (200 mg total) by mouth at bedtime. Patient taking differently: Take 400 mg by mouth at bedtime. 02/24/21 02/24/22  Nwoko, Tommas Olp, PA  rivaroxaban (XARELTO) 20 MG TABS tablet Take 1 tablet (20 mg total) by mouth daily with supper. 01/23/21   Arvilla Market, DO    Physical Exam: Constitutional: Moderately built and nourished. Vitals:   03/21/21 2030 03/21/21 2106 03/21/21 2242 03/21/21 2300  BP: 124/68 121/76 109/61   Pulse: 87 82 87   Resp: 15 14 15    Temp:      TempSrc:      SpO2: 95% 98% 99%   Weight:    103.9 kg   Eyes: Anicteric no pallor. ENMT: No discharge from the ears eyes nose or mouth. Neck: No mass felt.  No neck rigidity. Respiratory: No rhonchi or crepitations. Cardiovascular: S1-S2 heard. Abdomen: Soft nontender bowel sound present. Musculoskeletal: No edema. Skin: No rash. Neurologic: Alert awake oriented to time place and person.  Moves all extremities. Psychiatric: Appears normal.  Normal affect.   Labs on Admission: I have personally reviewed following labs and imaging studies  CBC: Recent Labs  Lab 03/19/21 1743 03/21/21 1809  WBC 10.2 8.1  NEUTROABS 6.9 5.8  HGB 17.1* 13.5  HCT 49.9 39.2  MCV 89.9 90.5  PLT 314 247   Basic Metabolic Panel: Recent Labs  Lab 03/19/21 1743 03/21/21 1809  NA 141 128*  K 4.4 4.5  CL 99 97*  CO2 31 22   GLUCOSE 133* 748*  BUN 19 18  CREATININE 1.54* 1.11  CALCIUM 10.6* 8.7*   GFR: Estimated Creatinine Clearance: 117.2 mL/min (by C-G formula based on SCr of 1.11 mg/dL). Liver Function Tests: Recent Labs  Lab 03/19/21 1743 03/21/21 1809  AST 31 17  ALT 16 14  ALKPHOS 105 82  BILITOT 1.2 1.5*  PROT 9.6* 7.0  ALBUMIN 5.5* 4.3   Recent Labs  Lab 03/19/21 1743  LIPASE 20   No results for input(s): AMMONIA in the last 168 hours. Coagulation Profile: No results for input(s): INR, PROTIME in the last 168 hours. Cardiac Enzymes: Recent Labs  Lab 03/19/21 1743  CKTOTAL 133   BNP (last 3  results) No results for input(s): PROBNP in the last 8760 hours. HbA1C: No results for input(s): HGBA1C in the last 72 hours. CBG: Recent Labs  Lab 03/19/21 1655 03/21/21 1746 03/21/21 2222 03/21/21 2314  GLUCAP 159* >600* >600* >600*   Lipid Profile: No results for input(s): CHOL, HDL, LDLCALC, TRIG, CHOLHDL, LDLDIRECT in the last 72 hours. Thyroid Function Tests: No results for input(s): TSH, T4TOTAL, FREET4, T3FREE, THYROIDAB in the last 72 hours. Anemia Panel: No results for input(s): VITAMINB12, FOLATE, FERRITIN, TIBC, IRON, RETICCTPCT in the last 72 hours. Urine analysis:    Component Value Date/Time   COLORURINE STRAW (A) 03/21/2021 1953   APPEARANCEUR CLEAR 03/21/2021 1953   APPEARANCEUR Clear 01/20/2015 2136   LABSPEC 1.026 03/21/2021 1953   LABSPEC 1.023 01/20/2015 2136   PHURINE 6.0 03/21/2021 1953   GLUCOSEU >=500 (A) 03/21/2021 1953   GLUCOSEU >=500 01/20/2015 2136   HGBUR NEGATIVE 03/21/2021 1953   BILIRUBINUR NEGATIVE 03/21/2021 1953   BILIRUBINUR Negative 01/20/2015 2136   KETONESUR 20 (A) 03/21/2021 1953   PROTEINUR NEGATIVE 03/21/2021 1953   NITRITE NEGATIVE 03/21/2021 1953   LEUKOCYTESUR NEGATIVE 03/21/2021 1953   LEUKOCYTESUR Negative 01/20/2015 2136   Sepsis Labs: @LABRCNTIP (procalcitonin:4,lacticidven:4) ) Recent Results (from the past 240  hour(s))  Resp Panel by RT-PCR (Flu A&B, Covid) Nasopharyngeal Swab     Status: None   Collection Time: 03/19/21  5:43 PM   Specimen: Nasopharyngeal Swab; Nasopharyngeal(NP) swabs in vial transport medium  Result Value Ref Range Status   SARS Coronavirus 2 by RT PCR NEGATIVE NEGATIVE Final    Comment: (NOTE) SARS-CoV-2 target nucleic acids are NOT DETECTED.  The SARS-CoV-2 RNA is generally detectable in upper respiratory specimens during the acute phase of infection. The lowest concentration of SARS-CoV-2 viral copies this assay can detect is 138 copies/mL. A negative result does not preclude SARS-Cov-2 infection and should not be used as the sole basis for treatment or other patient management decisions. A negative result may occur with  improper specimen collection/handling, submission of specimen other than nasopharyngeal swab, presence of viral mutation(s) within the areas targeted by this assay, and inadequate number of viral copies(<138 copies/mL). A negative result must be combined with clinical observations, patient history, and epidemiological information. The expected result is Negative.  Fact Sheet for Patients:  03/21/21  Fact Sheet for Healthcare Providers:  BloggerCourse.com  This test is no t yet approved or cleared by the [employer]ious[employer].[place] FDA and  has been authorized for detection and/or diagnosis of SARS-CoV-2 by FDA under an Emergency Use Authorization (EUA). This EUA will remain  in effect (meaning this test can be used) for the duration of the COVID-19 declaration under Section 564(b)(1) of the Act, 21 U.S.C.section 360bbb-3(b)(1), unless the authorization is terminated  or revoked sooner.       Influenza A by PCR NEGATIVE NEGATIVE Final   Influenza B by PCR NEGATIVE NEGATIVE Final    Comment: (NOTE) The Xpert Xpress SARS-CoV-2/FLU/RSV plus assay is intended as an aid in the diagnosis of influenza from  Nasopharyngeal swab specimens and should not be used as a sole basis for treatment. Nasal washings and aspirates are unacceptable for Xpert Xpress SARS-CoV-2/FLU/RSV testing.  Fact Sheet for Patients: [place]  Fact Sheet for Healthcare Providers: BloggerCourse.com  This test is not yet approved or cleared by the [employer]iousBroker.it FDA and has been authorized for detection and/or diagnosis of SARS-CoV-2 by FDA under an Emergency Use Authorization (EUA). This EUA will remain in effect (meaning this test can  be used) for the duration of the COVID-19 declaration under Section 564(b)(1) of the Act, 21 U.S.C. section 360bbb-3(b)(1), unless the authorization is terminated or revoked.  Performed at [facility], [address] [place], [place] [address]      Radiological Exams on Admission: No results found.   Assessment/Plan Principal Problem:   Uncontrolled type 1 diabetes mellitus with hyperglycemia (HCC) Active Problems:   Seizure disorder (HCC)   Hydrocephalus (HCC)   Hyperglycemia    1. Uncontrolled diabetes mellitus type 1 with severe hyperglycemia secondary to noncompliance with medications.  Patient missed his dose of insulin.  At this time patient is on insulin drip will slowly transition to patient's home dose of insulin once blood sugar less than 250.  Check hemoglobin A1c.  Advised about compliance. 2. History of seizure disorder and bipolar disorder.  Patient's neurologist note recently on Mar 02, 2021 states that patient's Depakote is to be discontinued and started on Lamictal.  But patient states he has not taken Lamictal and also has missed his Depakote dose.  I discussed with on-call neurology Dr. Otelia Limes given the history of epilepsy Dr. Otelia Limes advised to load patient with Keppra 1 g and keep patient on Keppra 500 twice daily.  For his bipolar disorder we will continue with  Seroquel. 3. Hypothyroidism on Synthroid. 4. History of hydrocephalus being followed by neurologist. 5. Acute renal failure likely from dehydration.  Follow metabolic panel after hydration. 6. History of DVT diagnosed in December 2020 while on Xarelto which patient has not taken.  We will keep patient on Xarelto check Dopplers and if negative for DVT may discontinue Xarelto.   DVT prophylaxis: On Xarelto which may be discontinued if DVT studies are negative. Code Status: Full code. Family Communication: Discussed with patient. Disposition Plan: Home. Consults called: Discussed with neurologist. Admission status: Observation.   Eduard Clos MD [facility] Pager [phone].  If 7PM-7AM, please contact night-coverage [email or web address] Password [facility]  03/21/2021, 11:59 PM

## 2021-03-21 NOTE — ED Notes (Signed)
Horton, DO notified of CBG reading

## 2021-03-21 NOTE — ED Notes (Signed)
RN explained to pt he cannot have food at this time because his blood glucose is elevated.

## 2021-03-21 NOTE — ED Notes (Signed)
Pt verbally aggressive with staff

## 2021-03-21 NOTE — ED Provider Notes (Signed)
Emergency Medicine Provider Triage Evaluation Note  Mark [name] , a 35 y.o. male  was evaluated in triage.  Pt complains of hypoglycemia.  Ran out of insulin 2 days ago.  Complaining of vomiting, nausea, abdominal pain and feeling "delirious."  Denies any chest pain.  Review of Systems  Positive: Abdominal pain, vomiting Negative: Chest pain  Physical Exam  BP 115/72 (BP Location: Left Arm)   Pulse (!) 102   Temp 98.3 F (36.8 C) (Oral)   Resp 16   SpO2 96%  Gen:   Awake, no distress   Resp:  Normal effort  MSK:   Moves extremities without difficulty  Other:  Abdomen is generally tender  Medical Decision Making  Medically screening exam initiated at 5:52 PM.  Appropriate orders placed.  [name] was informed that the remainder of the evaluation will be completed by another provider, this initial triage assessment does not replace that evaluation, and the importance of remaining in the ED until their evaluation is complete.  CBG is greater than 600 here.  Will order additional labs and advise RN that patient needs to be room soon   Dietrich Pates, PA-C 03/21/21 1753    Terald Sleeper, MD 03/22/21 1110

## 2021-03-21 NOTE — ED Provider Notes (Signed)
[facility]-EMERGENCY DEPT Provider Note   CSN: [id number] Arrival date & time: 03/21/21  1736     History Chief Complaint  Patient presents with  . Hyperglycemia    Mark [name] is a 35 y.o. male.  HPI   35 year old male with past medical history of DM, kidney stones presents emergency department concern for hyperglycemia.  Patient states that he ran out of his insulin prescription about 2 days ago.  He presents today with nausea and feeling dehydrated.  Patient is a poor historian, not forthcoming, rude at times.  He will not tell me who usually prescribes his insulin.  He denies any other acute illness including fever, vomiting, diarrhea, cough, shortness of breath.  Past Medical History:  Diagnosis Date  . Anxiety   . Bipolar disorder (HCC)   . Depression   . Diabetes mellitus without complication (HCC)   . Diabetic polyneuropathy associated with type 1 diabetes mellitus (HCC)   . Hydrocephalus (HCC)   . Kidney stones   . Seizures (HCC)   . Truncal ataxia     Patient Active Problem List   Diagnosis Date Noted  . Hydrocephalus (HCC) 03/02/2021  . Bipolar affective disorder, current episode mixed (HCC) 02/24/2021  . Generalized anxiety disorder 02/24/2021  . History of DVT of lower extremity 12/07/2020  . Right leg DVT (HCC) 11/03/2020  . Insomnia due to other mental disorder   . Bipolar I disorder (HCC)   . Uncontrolled type 1 diabetes mellitus with hyperglycemia (HCC)   . Seizures (HCC)   . Bipolar I disorder, current or most recent episode depressed, in partial remission (HCC)   . MDD (major depressive disorder), recurrent episode, severe (HCC) 03/13/2020  . Drug overdose, intentional (HCC) 03/05/2020  . Drug abuse (HCC) 03/05/2020  . Attempted suicide (HCC) 03/05/2020  . Insulin overdose   . Hypothyroidism 02/02/2020  . Diabetic polyneuropathy associated with type 1 diabetes mellitus (HCC) 02/02/2020  . Homelessness 12/29/2019  .  Marijuana abuse 12/29/2019  . Tobacco dependence 12/29/2019  . Seizure disorder (HCC) 12/29/2019  . Suicidal ideation   . Bipolar I disorder, most recent episode depressed, severe without psychotic features (HCC)   . Adjustment disorder with depressed mood 12/07/2017  . Dysthymia 10/08/2017  . Personality disorder in adult [facility]) 09/26/2017  . 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 Obstructive hydrocephalus (HCC) 09/19/2017  . MDD (major depressive disorder) 08/22/2017  . Severe recurrent major depression with psychotic features (HCC) 08/19/2017  . Hyponatremia 08/18/2017    Past Surgical History:  Procedure Laterality Date  . BRAIN SURGERY     hydrocephalus  . KIDNEY STONE SURGERY         Family History  Problem Relation Age of Onset  . Breast cancer Mother   . Other Father        unsure of medical history  . Diabetes Paternal Aunt     Social History   Tobacco Use  . Smoking status: Current Every Day Smoker    Packs/day: 1.00    Years: 20.00    Pack years: 20.00    Types: Cigarettes  . Smokeless tobacco: Never Used  Vaping Use  . Vaping Use: Never used  Substance Use Topics  . Alcohol use: Yes    Comment: occassional   . Drug use: Yes    Frequency: 7.0 times per week    Types: Marijuana    Comment: smoke daily    Home Medications Prior to Admission medications   Medication Sig Start  Date End Date Taking? Authorizing Provider  acetaminophen (TYLENOL) 325 MG tablet Take 2 tablets (650 mg total) by mouth every 6 (six) hours as needed for mild pain, fever or headache. 09/30/20   Russella Dar, NP  divalproex (DEPAKOTE ER) 250 MG 24 hr tablet Take 3 tablets (750 mg total) by mouth every evening. 03/02/21 03/02/22  Levert Feinstein, MD  escitalopram (LEXAPRO) 10 MG tablet Take 1 tablet (10 mg total) by mouth daily. 02/24/21 02/24/22  Nwoko, Tommas Olp, PA  gabapentin (NEURONTIN) 100 MG capsule Take 1 capsule (100 mg total) by mouth 2 (two) times daily. 09/30/20   Russella Dar, NP   glucose blood (FREESTYLE LITE) test strip 1 each by Other route in the morning, at noon, and at bedtime. And lancets 3/day 01/16/21   Romero Belling, MD  insulin NPH-regular Human (NOVOLIN 70/30 RELION) (70-30) 100 UNIT/ML injection 3 times a day (just before each meal) 45-35-25 units, and syringes 1/day 03/19/21   Linwood Dibbles, MD  Insulin Syringe-Needle U-100 30G X 1/2" 0.5 ML MISC USE AS DIRECTED 03/19/21 03/19/22  Linwood Dibbles, MD  lamoTRIgine (LAMICTAL) 100 MG tablet 1/2 once at night for one week,  1/2 twice a day for one week. 1/2 in am, 1 at night. One tab twice a day Patient not taking: No sig reported 03/15/21   Asa Lente, MD  Lancets MISC 1 application by Does not apply route daily. 12/29/20   Arvilla Market, DO  levothyroxine (SYNTHROID) 50 MCG tablet Take 1 tablet (50 mcg total) by mouth daily at 6 (six) AM. 10/01/20   Russella Dar, NP  ondansetron (ZOFRAN ODT) 8 MG disintegrating tablet Take 1 tablet (8 mg total) by mouth every 8 (eight) hours as needed for nausea or vomiting. 03/19/21   Linwood Dibbles, MD  pyridOXINE (B-6) 50 MG tablet Take 1 tablet (50 mg total) by mouth daily. Patient not taking: No sig reported 10/01/20   Russella Dar, NP  QUEtiapine (SEROQUEL) 100 MG tablet Take 1 tablet (100 mg total) by mouth daily. 02/24/21 02/24/22  Nwoko, Tommas Olp, PA  QUEtiapine (SEROQUEL) 200 MG tablet Take 1 tablet (200 mg total) by mouth at bedtime. Patient taking differently: Take 400 mg by mouth at bedtime. 02/24/21 02/24/22  Nwoko, Tommas Olp, PA  rivaroxaban (XARELTO) 20 MG TABS tablet Take 1 tablet (20 mg total) by mouth daily with supper. 01/23/21   Arvilla Market, DO    Allergies    Mushroom extract complex, Penicillins, Sulfa antibiotics, and Clindamycin/lincomycin  Review of Systems   Review of Systems  Constitutional: Positive for fatigue. Negative for chills and fever.  HENT: Negative for congestion.   Respiratory: Negative for shortness of breath.    Cardiovascular: Negative for chest pain.  Gastrointestinal: Positive for nausea. Negative for abdominal pain, diarrhea and vomiting.  Genitourinary: Negative for dysuria.  Skin: Negative for rash.  Neurological: Negative for headaches.    Physical Exam Updated Vital Signs BP 121/76   Pulse 82   Temp 98.3 F (36.8 C) (Oral)   Resp 14   SpO2 98%   Physical Exam Vitals and nursing note reviewed.  Constitutional:      Appearance: Normal appearance.     Comments: Flat affect, short answers  HENT:     Head: Normocephalic.     Mouth/Throat:     Mouth: Mucous membranes are moist.  Cardiovascular:     Rate and Rhythm: Normal rate.  Pulmonary:     Effort: Pulmonary  effort is normal. No respiratory distress.  Abdominal:     Palpations: Abdomen is soft.     Tenderness: There is no abdominal tenderness.  Skin:    General: Skin is warm.  Neurological:     Mental Status: He is alert and oriented to person, place, and time. Mental status is at baseline.     ED Results / Procedures / Treatments   Labs (all labs ordered are listed, but only abnormal results are displayed) Labs Reviewed  COMPREHENSIVE METABOLIC PANEL - Abnormal; Notable for the following components:      Result Value   Sodium 128 (*)    Chloride 97 (*)    Glucose, Bld 748 (*)    Calcium 8.7 (*)    Total Bilirubin 1.5 (*)    All other components within normal limits  URINALYSIS, ROUTINE W REFLEX MICROSCOPIC - Abnormal; Notable for the following components:   Color, Urine STRAW (*)    Glucose, UA >=500 (*)    Ketones, ur 20 (*)    All other components within normal limits  CBG MONITORING, ED - Abnormal; Notable for the following components:   Glucose-Capillary >600 (*)    All other components within normal limits  CBC WITH DIFFERENTIAL/PLATELET  BLOOD GAS, VENOUS    EKG None  Radiology No results found.  Procedures .Critical Care Performed by: Rozelle Logan, DO Authorized by: Rozelle Logan, DO   Critical care provider statement:    Critical care time (minutes):  45   Critical care was necessary to treat or prevent imminent or life-threatening deterioration of the following conditions:  Endocrine crisis   Critical care was time spent personally by me on the following activities:  Discussions with consultants, evaluation of patient's response to treatment, examination of patient, ordering and performing treatments and interventions, ordering and review of laboratory studies, ordering and review of radiographic studies, pulse oximetry, re-evaluation of patient's condition, obtaining history from patient or surrogate and review of old charts   I assumed direction of critical care for this patient from another provider in my specialty: no       Medications Ordered in ED Medications  insulin aspart (novoLOG) injection 10 Units (has no administration in time range)  sodium chloride 0.9 % bolus 1,000 mL (has no administration in time range)    ED Course  I have reviewed the triage vital signs and the nursing notes.  Pertinent labs & imaging results that were available during my care of the patient were reviewed by me and considered in my medical decision making (see chart for details).    MDM Rules/Calculators/A&P                          35 year old male presents emergency department concern for hyperglycemia after running out of his insulin prescription 2 days ago.  Blood sugars reading is high on fingerstick.  IV fluids and subcutaneous insulin dose ordered.  Blood work shows hyperglycemia over 700 with a pseudohyponatremia, potassium is stable.  No acidosis, small amount of ketones in urine.  Suspicious for HHS.  IV insulin and fluids have been ordered.  Patient require admission.  Patients evaluation and results requires admission for further treatment and care. Patient agrees with admission plan, offers no new complaints and is stable/unchanged at time of admit.  Final  Clinical Impression(s) / ED Diagnoses Final diagnoses:  None    Rx / DC Orders ED Discharge Orders  None       Rozelle LoganHorton, Rutherford Alarie M, DO 03/21/21 2342

## 2021-03-21 NOTE — ED Triage Notes (Signed)
Per EMS-states he has been out of his insulin for 2 days

## 2021-03-21 NOTE — ED Notes (Signed)
Pt pulled IV out in lobby bleeding onto floor. Lurena Joiner RN place bandage on pt arm. Pt pulled bandage off. Pt cursing in lobby stating he should be in a bed and better get one soon or he is going to go out the door and kill himself by walking into traffic, pt still in lobby at this time.. Explained to pt many pt are waiting to get a room and he is waiting on one also. Pt stating his sugar is high so he should be in a bed. Charge nurse aware of pt statements.

## 2021-03-21 NOTE — ED Notes (Signed)
Pt removed his EMS placed IV from his arm. Pt shouted that he was sick and needed to be seen. Pt was given gauze for his bleeding arm. Pt threw the gauze on the floor and continued to shout at this RN.

## 2021-03-22 ENCOUNTER — Observation Stay (HOSPITAL_BASED_OUTPATIENT_CLINIC_OR_DEPARTMENT_OTHER): Payer: 59

## 2021-03-22 DIAGNOSIS — R739 Hyperglycemia, unspecified: Secondary | ICD-10-CM

## 2021-03-22 DIAGNOSIS — R609 Edema, unspecified: Secondary | ICD-10-CM | POA: Diagnosis not present

## 2021-03-22 DIAGNOSIS — G40909 Epilepsy, unspecified, not intractable, without status epilepticus: Secondary | ICD-10-CM

## 2021-03-22 DIAGNOSIS — G919 Hydrocephalus, unspecified: Secondary | ICD-10-CM | POA: Diagnosis not present

## 2021-03-22 DIAGNOSIS — E1065 Type 1 diabetes mellitus with hyperglycemia: Secondary | ICD-10-CM | POA: Diagnosis not present

## 2021-03-22 LAB — CBC WITH DIFFERENTIAL/PLATELET
Abs Immature Granulocytes: 0.1 10*3/uL — ABNORMAL HIGH (ref 0.00–0.07)
Basophils Absolute: 0 10*3/uL (ref 0.0–0.1)
Basophils Relative: 0 %
Eosinophils Absolute: 0.1 10*3/uL (ref 0.0–0.5)
Eosinophils Relative: 1 %
HCT: 36.8 % — ABNORMAL LOW (ref 39.0–52.0)
Hemoglobin: 12.6 g/dL — ABNORMAL LOW (ref 13.0–17.0)
Immature Granulocytes: 1 %
Lymphocytes Relative: 24 %
Lymphs Abs: 2.4 10*3/uL (ref 0.7–4.0)
MCH: 31.1 pg (ref 26.0–34.0)
MCHC: 34.2 g/dL (ref 30.0–36.0)
MCV: 90.9 fL (ref 80.0–100.0)
Monocytes Absolute: 0.1 10*3/uL (ref 0.1–1.0)
Monocytes Relative: 1 %
Neutro Abs: 7 10*3/uL (ref 1.7–7.7)
Neutrophils Relative %: 73 %
Platelets: 228 10*3/uL (ref 150–400)
RBC: 4.05 MIL/uL — ABNORMAL LOW (ref 4.22–5.81)
RDW: 11.9 % (ref 11.5–15.5)
WBC: 9.7 10*3/uL (ref 4.0–10.5)
nRBC: 0 % (ref 0.0–0.2)

## 2021-03-22 LAB — BASIC METABOLIC PANEL
Anion gap: 10 (ref 5–15)
BUN: 15 mg/dL (ref 6–20)
CO2: 24 mmol/L (ref 22–32)
Calcium: 9.1 mg/dL (ref 8.9–10.3)
Chloride: 101 mmol/L (ref 98–111)
Creatinine, Ser: 0.96 mg/dL (ref 0.61–1.24)
GFR, Estimated: >60 mL/min (ref >60)
Glucose, Bld: 173 mg/dL — ABNORMAL HIGH (ref 70–99)
Potassium: 3.9 mmol/L (ref 3.5–5.1)
Sodium: 135 mmol/L (ref 135–145)

## 2021-03-22 LAB — GLUCOSE, CAPILLARY
Glucose-Capillary: 178 mg/dL — ABNORMAL HIGH (ref 70–99)
Glucose-Capillary: 179 mg/dL — ABNORMAL HIGH (ref 70–99)
Glucose-Capillary: 167 mg/dL — ABNORMAL HIGH (ref 70–99)
Glucose-Capillary: 122 mg/dL — ABNORMAL HIGH (ref 70–99)
Glucose-Capillary: 88 mg/dL (ref 70–99)
Glucose-Capillary: 147 mg/dL — ABNORMAL HIGH (ref 70–99)
Glucose-Capillary: 174 mg/dL — ABNORMAL HIGH (ref 70–99)
Glucose-Capillary: 190 mg/dL — ABNORMAL HIGH (ref 70–99)
Glucose-Capillary: 236 mg/dL — ABNORMAL HIGH (ref 70–99)
Glucose-Capillary: 202 mg/dL — ABNORMAL HIGH (ref 70–99)
Glucose-Capillary: 158 mg/dL — ABNORMAL HIGH (ref 70–99)

## 2021-03-22 LAB — VALPROIC ACID LEVEL: Valproic Acid Lvl: <10 ug/mL — ABNORMAL LOW (ref 50.0–100.0)

## 2021-03-22 LAB — MRSA PCR SCREENING: MRSA by PCR: NEGATIVE

## 2021-03-22 LAB — OSMOLALITY: Osmolality: 310 mOsm/kg — ABNORMAL HIGH (ref 275–295)

## 2021-03-22 LAB — CBG MONITORING, ED: Glucose-Capillary: 242 mg/dL — ABNORMAL HIGH (ref 70–99)

## 2021-03-22 LAB — HIV ANTIBODY (ROUTINE TESTING W REFLEX): HIV Screen 4th Generation wRfx: NONREACTIVE

## 2021-03-22 MED ORDER — PYRIDOXINE HCL 50 MG PO TABS: 50.0000 mg | ORAL_TABLET | Freq: Every day | ORAL | 0 refills | Status: DC

## 2021-03-22 MED ORDER — QUETIAPINE FUMARATE 100 MG PO TABS
100.0000 mg | ORAL_TABLET | Freq: Every day | ORAL | Status: DC
  Administered 2021-03-22: 100 mg via ORAL
  Filled 2021-03-22: qty 1

## 2021-03-22 MED ORDER — QUETIAPINE FUMARATE 100 MG PO TABS: 400.0000 mg | ORAL_TABLET | Freq: Every day | ORAL | Status: DC

## 2021-03-22 MED ORDER — RIVAROXABAN 20 MG PO TABS: 20.0000 mg | ORAL_TABLET | Freq: Every day | ORAL | Status: DC

## 2021-03-22 MED ORDER — INSULIN ASPART PROT & ASPART (70-30 MIX) 100 UNIT/ML ~~LOC~~ SUSP
35.0000 [IU] | Freq: Two times a day (BID) | SUBCUTANEOUS | Status: DC
  Administered 2021-03-22: 35 [IU] via SUBCUTANEOUS
  Filled 2021-03-22: qty 10

## 2021-03-22 MED ORDER — LEVETIRACETAM IN NACL 1000 MG/100ML IV SOLN
1000.0000 mg | Freq: Once | INTRAVENOUS | Status: AC
  Administered 2021-03-22: 1000 mg via INTRAVENOUS
  Filled 2021-03-22: qty 100

## 2021-03-22 MED ORDER — GABAPENTIN 100 MG PO CAPS
100.0000 mg | ORAL_CAPSULE | Freq: Two times a day (BID) | ORAL | 0 refills | Status: DC
  Filled 2021-03-24 – 2021-04-07 (×2): qty 60, 30d supply, fill #0

## 2021-03-22 MED ORDER — CHLORHEXIDINE GLUCONATE CLOTH 2 % EX PADS: 6.0000 | MEDICATED_PAD | Freq: Every day | CUTANEOUS | Status: DC

## 2021-03-22 MED ORDER — NOVOLIN 70/30 RELION (70-30) 100 UNIT/ML ~~LOC~~ SUSP: SUBCUTANEOUS | 0 refills | Status: DC

## 2021-03-22 MED ORDER — LEVOTHYROXINE SODIUM 50 MCG PO TABS
50.0000 ug | ORAL_TABLET | Freq: Every day | ORAL | Status: DC
  Administered 2021-03-22: 50 ug via ORAL
  Filled 2021-03-22: qty 1

## 2021-03-22 MED ORDER — INSULIN ASPART 100 UNIT/ML IJ SOLN: 0.0000 [IU] | Freq: Every day | INTRAMUSCULAR | Status: DC

## 2021-03-22 MED ORDER — "INSULIN SYRINGE-NEEDLE U-100 30G X 1/2"" 0.5 ML MISC": 0 refills | Status: DC

## 2021-03-22 MED ORDER — LEVETIRACETAM 500 MG PO TABS
500.0000 mg | ORAL_TABLET | Freq: Two times a day (BID) | ORAL | 0 refills | Status: DC
Start: 2021-03-22 — End: 2021-03-24

## 2021-03-22 MED ORDER — QUETIAPINE FUMARATE 100 MG PO TABS: 100.0000 mg | ORAL_TABLET | Freq: Every day | ORAL | 0 refills | Status: DC

## 2021-03-22 MED ORDER — INSULIN ASPART 100 UNIT/ML IJ SOLN
0.0000 [IU] | Freq: Three times a day (TID) | INTRAMUSCULAR | Status: DC
  Administered 2021-03-22: 3 [IU] via SUBCUTANEOUS

## 2021-03-22 MED ORDER — QUETIAPINE FUMARATE 200 MG PO TABS: 200.0000 mg | ORAL_TABLET | Freq: Every day | ORAL | 0 refills | Status: DC

## 2021-03-22 MED ORDER — LEVOTHYROXINE SODIUM 50 MCG PO TABS: 50.0000 ug | ORAL_TABLET | Freq: Every day | ORAL | 0 refills | Status: DC

## 2021-03-22 MED ORDER — ESCITALOPRAM OXALATE 10 MG PO TABS: 10.0000 mg | ORAL_TABLET | Freq: Every day | ORAL | 0 refills | Status: DC

## 2021-03-22 MED ORDER — LEVETIRACETAM IN NACL 500 MG/100ML IV SOLN
500.0000 mg | Freq: Two times a day (BID) | INTRAVENOUS | Status: DC
  Administered 2021-03-22: 500 mg via INTRAVENOUS
  Filled 2021-03-22: qty 100

## 2021-03-22 NOTE — Progress Notes (Signed)
Inpatient Diabetes Program Recommendations  AACE/ADA: New Consensus Statement on Inpatient Glycemic Control (2015)  Target Ranges:  Prepandial:   less than 140 mg/dL      Peak postprandial:   less than 180 mg/dL (1-2 hours)      Critically ill patients:  140 - 180 mg/dL   Lab Results  Component Value Date   GLUCAP 174 (H) 03/22/2021   HGBA1C 8.0 (A) 01/16/2021    Review of Glycemic Control  Diabetes history: DM type 1 Outpatient Diabetes medications: 70/30 45 units breakfast - 35 units lunch -25 units supper Current orders for Inpatient glycemic control:  IV insulin Inpatient Diabetes Program Recommendations:   - Consider transitioning pt on 70/30 35 units bid   Pt is familiar with our team. Pt sees Dr. Romero Belling, Endocrinologist outpatient for DM management. Last visit was 02/28/21. Pt was seen by DM coordinator previously and it was suggested he be placed on 70/30 for affordability. Pt will need to have more effort in going to the pharmacy to get his insulin and follow up with Dr. Everardo All.  Thanks,  Christena Deem RN, MSN, BC-ADM Inpatient Diabetes Coordinator Team Pager [phone] (8a-5p)

## 2021-03-22 NOTE — ED Notes (Signed)
Pt did not tolerate well insertion of 2nd PIV by this RN. Unsuccessful insertion.

## 2021-03-22 NOTE — Progress Notes (Signed)
Bilateral lower extremity venous duplex has been completed. Preliminary results can be found in CV Proc through chart review.   03/22/21 11:04 AM Olen Cordial RVT

## 2021-03-22 NOTE — Discharge Summary (Signed)
Physician Discharge Summary  Derek JackMichael Q Salada ZOX:[id number] DOB: 03/13/1986 DOA: 03/21/2021  PCP: Arvilla MarketWallace, Catherine Lauren, DO  Admit date: 03/21/2021 Discharge date: 03/22/2021  Admitted From: Home Disposition: Home  Recommendations for Outpatient Follow-up:  1. Follow up with PCP in 1 week with repeat CBC/BMP 2. Outpatient follow-up with neurology/endocrinology/psychiatry 3. Comply with medications and follow-up 4. Follow up in ED if symptoms worsen or new appear   Home Health: No Equipment/Devices: None  Discharge Condition: Stable CODE STATUS: Full Diet recommendation: Carb modified  Brief/Interim Summary: 35 y.o. male with history of diabetes mellitus type 1, seizure disorder, bipolar disorder, hypothyroidism.,  Hydrocephalus status post VP shunt presented with elevated blood sugars after not taking any of his medications for at least 2 weeks.  In the ED, blood sugar was 748 with anion gap of 9.  He was started on IV fluids and insulin drip.  During the hospitalization, his blood sugars have much improved.  He has been put on a diet and he is tolerating it.  He will be transitioned to 70/30 insulin and subsequently insulin drip will be turned off.  If he remains stable, he will be discharged home with outpatient follow-up with PCP/endocrinology/neurology/psychiatry.  Discharge Diagnoses:   Uncontrolled diabetes mellitus type 1 with severe hyperglycemia secondary to noncompliance with medications -Patient has not been taking his meds for the last 2 weeks.  Presented with severe hyperglycemia with blood sugar of 748 with anion gap of 9. -He was started on IV fluids and insulin drip.  During the hospitalization, his blood sugars have much improved.  He has been put on a diet and he is tolerating it.  He will be transitioned to 70/30 insulin and subsequently insulin drip will be turned off.  If he remains stable, he will be discharged home with outpatient follow-up with  PCP/endocrinology/neurology/psychiatry. -Patient was recently seen by endocrinology as an outpatient with recommendations of 70/30 insulin 3 times a day.  We will continue the same regimen.  Patient is to be compliant with medications and follow-up.  Diabetes coordinator evaluation appreciated.  History of seizure disorder and bipolar disorder -Patient was recently seen by neurology as an outpatient and Depakote was discontinued and patient was started on Lamictal.  He has not taken Lamictal.  Case was discussed with on-call neurology/Dr. Otelia LimesLindzen by admitting provider who recommended to start Keppra 500 mg twice a day.  Continue Keppra on discharge.  Outpatient follow-up with neurology.  -Continue Seroquel and escitalopram for his bipolar disorder.  Outpatient follow-up with psychiatry at earliest convenience  History of hydrocephalus -Outpatient follow-up with neurology  Acute renal failure Dehydration -Treated with IV fluids.  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  Outpatient follow-up with PCP.  Hypothyroidism -Continue with Synthroid  Discharge Instructions  Discharge Instructions    Ambulatory referral to Endocrinology   Complete by: As directed    Ambulatory referral to Neurology   Complete by: As directed    An appointment is requested in approximately: in 1-2 weeks   Diet Carb Modified   Complete by: As directed    Increase activity slowly   Complete by: As directed      Allergies as of 03/22/2021      Reactions   Mushroom Extract Complex Hives, Itching, Nausea And Vomiting   Penicillins Anaphylaxis, Hives, Swelling   Has  patient had a PCN reaction causing immediate rash, facial/tongue/throat swelling, SOB or lightheadedness with hypotension: Yes Has patient had a PCN reaction causing severe rash  involving mucus membranes or skin necrosis: No Has patient had a PCN reaction that required hospitalization: Yes Has patient had a PCN reaction occurring within the last 10 years: Yes If all of the above answers are "NO", then may proceed with Cephalosporin use. Tolerated ceftriaxone 03/24/20   Sulfa Antibiotics Anaphylaxis, Hives   Clindamycin/lincomycin Hives      Medication List    STOP taking these medications   divalproex 250 MG 24 hr tablet Commonly known as: Depakote ER   lamoTRIgine 100 MG tablet Commonly known as: LaMICtal   rivaroxaban 20 MG Tabs tablet Commonly known as: XARELTO     TAKE these medications   acetaminophen 325 MG tablet Commonly known as: TYLENOL Take 2 tablets (650 mg total) by mouth every 6 (six) hours as needed for mild pain, fever or headache.   escitalopram 10 MG tablet Commonly known as: Lexapro Take 1 tablet (10 mg total) by mouth daily.   FREESTYLE LITE test strip Generic drug: glucose blood 1 each by Other route in the morning, at noon, and at bedtime. And lancets 3/day   gabapentin 100 MG capsule Commonly known as: NEURONTIN Take 1 capsule (100 mg total) by mouth 2 (two) times daily.   Insulin Syringe-Needle U-100 30G X 1/2" 0.5 ML Misc USE AS DIRECTED   Lancets Misc 1 application by Does not apply route daily.   levETIRAcetam 500 MG tablet Commonly known as: Keppra Take 1 tablet (500 mg total) by mouth 2 (two) times daily.   levothyroxine 50 MCG tablet Commonly known as: SYNTHROID Take 1 tablet (50 mcg total) by mouth daily at 6 (six) AM.   NovoLIN 70/30 ReliOn (70-30) 100 UNIT/ML injection Generic drug: insulin NPH-regular Human 3 times a day (just before each meal) 45-35-25 units, and syringes 1/day   ondansetron 8 MG disintegrating tablet Commonly known as: Zofran ODT Take 1 tablet (8 mg total) by mouth every 8 (eight) hours as needed for nausea or vomiting.   pyridOXINE 50 MG tablet Commonly known as: B-6 Take 1  tablet (50 mg total) by mouth daily.   QUEtiapine 100 MG tablet Commonly known as: SEROQUEL Take 1 tablet (100 mg total) by mouth daily. What changed: Another medication with the same name was changed. Make sure you understand how and when to take each.   QUEtiapine 200 MG tablet Commonly known as: SEROQUEL Take 1 tablet (200 mg total) by mouth at bedtime. What changed: how much to take       Follow-up Information    Arvilla Market, DO. Schedule an appointment as soon as possible for a visit in 1 week(s).   Specialty: Family Medicine Contact information: [address] [place] [address] [phone]        Psychiatrist. Schedule an appointment as soon as possible for a visit.   Why: At earliest convenience             Allergies  Allergen Reactions  . Mushroom Extract Complex Hives, Itching and Nausea And Vomiting  . Penicillins Anaphylaxis, Hives and Swelling    Has patient had a PCN reaction causing immediate rash, facial/tongue/throat swelling, SOB or lightheadedness with hypotension: Yes Has patient had a PCN reaction causing severe rash involving mucus membranes or skin necrosis: No Has patient had a PCN reaction that required hospitalization: Yes Has patient had a PCN  reaction occurring within the last 10 years: Yes If all of the above answers are "NO", then may proceed with Cephalosporin use. Tolerated ceftriaxone 03/24/20  . Sulfa Antibiotics Anaphylaxis and Hives  . Clindamycin/Lincomycin Hives    Consultations:  None   Procedures/Studies: VAS [place] LOWER EXTREMITY VENOUS (DVT)  Result Date: 03/22/2021  Lower Venous DVT Study Patient Name:  Mark [name]  Date of Exam:   03/22/2021 Medical Rec #: [id number]         Accession #:    [id number] Date of Birth: May 14, 1986          Patient Gender: M Patient Age:   35Y Exam Location:  [name] Procedure:      VAS [place] LOWER EXTREMITY VENOUS (DVT) Referring Phys: [id number] Eduard Clos  --------------------------------------------------------------------------------  Indications: Edema.  Risk Factors: None identified. Comparison Study: 10/02/2020 - Acute right Pop, PTV, Pero, Gast DVT Performing Technologist: Chanda Busing RVT  Examination Guidelines: A complete evaluation includes B-mode imaging, spectral Doppler, color Doppler, and power Doppler as needed of all accessible portions of each vessel. Bilateral testing is considered an integral part of a complete examination. Limited examinations for reoccurring indications may be performed as noted. The reflux portion of the exam is performed with the patient in reverse Trendelenburg.  +---------+---------------+---------+-----------+----------+--------------+ RIGHT    CompressibilityPhasicitySpontaneityPropertiesThrombus Aging +---------+---------------+---------+-----------+----------+--------------+ CFV      Full           Yes      Yes                                 +---------+---------------+---------+-----------+----------+--------------+ SFJ      Full                                                        +---------+---------------+---------+-----------+----------+--------------+ FV Prox  Full                                                        +---------+---------------+---------+-----------+----------+--------------+ FV Mid   Full                                                        +---------+---------------+---------+-----------+----------+--------------+ FV DistalFull                                                        +---------+---------------+---------+-----------+----------+--------------+ PFV      Full                                                        +---------+---------------+---------+-----------+----------+--------------+ POP      Full  Yes      Yes                                  +---------+---------------+---------+-----------+----------+--------------+ PTV      Full                                                        +---------+---------------+---------+-----------+----------+--------------+ PERO     Full                                                        +---------+---------------+---------+-----------+----------+--------------+   +---------+---------------+---------+-----------+----------+--------------+ LEFT     CompressibilityPhasicitySpontaneityPropertiesThrombus Aging +---------+---------------+---------+-----------+----------+--------------+ CFV      Full           Yes      Yes                                 +---------+---------------+---------+-----------+----------+--------------+ SFJ      Full                                                        +---------+---------------+---------+-----------+----------+--------------+ FV Prox  Full                                                        +---------+---------------+---------+-----------+----------+--------------+ FV Mid   Full                                                        +---------+---------------+---------+-----------+----------+--------------+ FV DistalFull                                                        +---------+---------------+---------+-----------+----------+--------------+ PFV      Full                                                        +---------+---------------+---------+-----------+----------+--------------+ POP      Full           Yes      Yes                                 +---------+---------------+---------+-----------+----------+--------------+ PTV  Full                                                        +---------+---------------+---------+-----------+----------+--------------+ PERO     Full                                                         +---------+---------------+---------+-----------+----------+--------------+     Summary: RIGHT: - There is no evidence of deep vein thrombosis in the lower extremity.  - No cystic structure found in the popliteal fossa.  LEFT: - There is no evidence of deep vein thrombosis in the lower extremity.  - No cystic structure found in the popliteal fossa.  *See table(s) above for measurements and observations.    Preliminary        Subjective: Patient seen and examined at bedside.  Poor historian.  States that he is hungry and wants to eat.  No overnight fever or vomiting reported.  States that he wants to go home today.  Nursing staff reports intermittent agitation.  Discharge Exam: Vitals:   03/22/21 0400 03/22/21 0800  BP:  113/66  Pulse:    Resp: 12 16  Temp: 98.1 F (36.7 C)   SpO2:      General: Pt is alert, awake, not in acute distress.  Looks chronically ill.  Poor historian.  Slow to respond.  Flat affect. Cardiovascular: rate controlled, S1/S2 + Respiratory: bilateral decreased breath sounds at bases Abdominal: Soft, NT, ND, bowel sounds + Extremities: no edema, no cyanosis    The results of significant diagnostics from this hospitalization (including imaging, microbiology, ancillary and laboratory) are listed below for reference.     Microbiology: Recent Results (from the past 240 hour(s))  Resp Panel by RT-PCR (Flu A&B, Covid) Nasopharyngeal Swab     Status: None   Collection Time: 03/19/21  5:43 PM   Specimen: Nasopharyngeal Swab; Nasopharyngeal(NP) swabs in vial transport medium  Result Value Ref Range Status   SARS Coronavirus 2 by RT PCR NEGATIVE NEGATIVE Final    Comment: (NOTE) SARS-CoV-2 target nucleic acids are NOT DETECTED.  The SARS-CoV-2 RNA is generally detectable in upper respiratory specimens during the acute phase of infection. The lowest concentration of SARS-CoV-2 viral copies this assay can detect is 138 copies/mL. A negative result does not preclude  SARS-Cov-2 infection and should not be used as the sole basis for treatment or other patient management decisions. A negative result may occur with  improper specimen collection/handling, submission of specimen other than nasopharyngeal swab, presence of viral mutation(s) within the areas targeted by this assay, and inadequate number of viral copies(<138 copies/mL). A negative result must be combined with clinical observations, patient history, and epidemiological information. The expected result is Negative.  Fact Sheet for Patients:  BloggerCourse.com  Fact Sheet for Healthcare Providers:  SeriousBroker.it  This test is no t yet approved or cleared by the [place] FDA and  has been authorized for detection and/or diagnosis of SARS-CoV-2 by FDA under an Emergency Use Authorization (EUA). This EUA will remain  in effect (meaning this test can be used) for the duration of the COVID-19 declaration under Section 564(b)(1) of the  Act, 21 U.S.C.section 360bbb-3(b)(1), unless the authorization is terminated  or revoked sooner.       Influenza A by PCR NEGATIVE NEGATIVE Final   Influenza B by PCR NEGATIVE NEGATIVE Final    Comment: (NOTE) The Xpert Xpress SARS-CoV-2/FLU/RSV plus assay is intended as an aid in the diagnosis of influenza from Nasopharyngeal swab specimens and should not be used as a sole basis for treatment. Nasal washings and aspirates are unacceptable for Xpert Xpress SARS-CoV-2/FLU/RSV testing.  Fact Sheet for Patients: BloggerCourse.com  Fact Sheet for Healthcare Providers: SeriousBroker.it  This test is not yet approved or cleared by the [place] FDA and has been authorized for detection and/or diagnosis of SARS-CoV-2 by FDA under an Emergency Use Authorization (EUA). This EUA will remain in effect (meaning this test can be used) for the duration of  the COVID-19 declaration under Section 564(b)(1) of the Act, 21 U.S.C. section 360bbb-3(b)(1), unless the authorization is terminated or revoked.  Performed at [facility], [address], [place], [place] [address]   MRSA PCR Screening     Status: None   Collection Time: 03/22/21  3:08 AM   Specimen: Nasal Mucosa; Nasopharyngeal  Result Value Ref Range Status   MRSA by PCR NEGATIVE NEGATIVE Final    Comment:        The GeneXpert MRSA Assay (FDA approved for NASAL specimens only), is one component of a comprehensive MRSA colonization surveillance program. It is not intended to diagnose MRSA infection nor to guide or monitor treatment for MRSA infections. Performed at [facility], [name], [place], [place] [address]      Labs: BNP (last 3 results) No results for input(s): BNP in the last 8760 hours. Basic Metabolic Panel: Recent Labs  Lab 03/19/21 1743 03/21/21 1809 03/22/21 0318  NA 141 128* 135  K 4.4 4.5 3.9  CL 99 97* 101  CO2 GLUCOSE 133* 748* 173*  BUN CREATININE 1.54* 1.11 0.96  CALCIUM 10.6* 8.7* 9.1   Liver Function Tests: Recent Labs  Lab 03/19/21 1743 03/21/21 1809  AST 31 17  ALT 16 14  ALKPHOS 105 82  BILITOT 1.2 1.5*  PROT 9.6* 7.0  ALBUMIN 5.5* 4.3   Recent Labs  Lab 03/19/21 1743  LIPASE 20   No results for input(s): AMMONIA in the last 168 hours. CBC: Recent Labs  Lab 03/19/21 1743 03/21/21 1809 03/21/21 2340  WBC 10.2 8.1 9.7  NEUTROABS 6.9 5.8 7.0  HGB 17.1* 13.5 12.6*  HCT 49.9 39.2 36.8*  MCV 89.9 90.5 90.9  PLT 314 247 228   Cardiac Enzymes: Recent Labs  Lab 03/19/21 1743  CKTOTAL 133   BNP: Invalid input(s): POCBNP CBG: Recent Labs  Lab 03/22/21 0605 03/22/21 0734 03/22/21 0838 03/22/21 0941 03/22/21 1053  GLUCAP 88 147* 174* 190* 236*   D-Dimer No results for input(s): DDIMER in the last 72 hours. Hgb A1c No results for input(s):  HGBA1C in the last 72 hours. Lipid Profile No results for input(s): CHOL, HDL, LDLCALC, TRIG, CHOLHDL, LDLDIRECT in the last 72 hours. Thyroid function studies No results for input(s): TSH, T4TOTAL, T3FREE, THYROIDAB in the last 72 hours.  Invalid input(s): FREET3 Anemia work up No results for input(s): VITAMINB12, FOLATE, FERRITIN, TIBC, IRON, RETICCTPCT in the last 72 hours. Urinalysis    Component Value Date/Time   COLORURINE STRAW (A) 03/21/2021 1953   APPEARANCEUR CLEAR 03/21/2021 1953   APPEARANCEUR Clear 01/20/2015 2136  LABSPEC 1.026 03/21/2021 1953   LABSPEC 1.023 01/20/2015 2136   PHURINE 6.0 03/21/2021 1953   GLUCOSEU >=500 (A) 03/21/2021 1953   GLUCOSEU >=500 01/20/2015 2136   HGBUR NEGATIVE 03/21/2021 1953   BILIRUBINUR NEGATIVE 03/21/2021 1953   BILIRUBINUR Negative 01/20/2015 2136   KETONESUR 20 (A) 03/21/2021 1953   PROTEINUR NEGATIVE 03/21/2021 1953   NITRITE NEGATIVE 03/21/2021 1953   LEUKOCYTESUR NEGATIVE 03/21/2021 1953   LEUKOCYTESUR Negative 01/20/2015 2136   Sepsis Labs Invalid input(s): PROCALCITONIN,  WBC,  LACTICIDVEN Microbiology Recent Results (from the past 240 hour(s))  Resp Panel by RT-PCR (Flu A&B, Covid) Nasopharyngeal Swab     Status: None   Collection Time: 03/19/21  5:43 PM   Specimen: Nasopharyngeal Swab; Nasopharyngeal(NP) swabs in vial transport medium  Result Value Ref Range Status   SARS Coronavirus 2 by RT PCR NEGATIVE NEGATIVE Final    Comment: (NOTE) SARS-CoV-2 target nucleic acids are NOT DETECTED.  The SARS-CoV-2 RNA is generally detectable in upper respiratory specimens during the acute phase of infection. The lowest concentration of SARS-CoV-2 viral copies this assay can detect is 138 copies/mL. A negative result does not preclude SARS-Cov-2 infection and should not be used as the sole basis for treatment or other patient management decisions. A negative result may occur with  improper specimen collection/handling,  submission of specimen other than nasopharyngeal swab, presence of viral mutation(s) within the areas targeted by this assay, and inadequate number of viral copies(<138 copies/mL). A negative result must be combined with clinical observations, patient history, and epidemiological information. The expected result is Negative.  Fact Sheet for Patients:  BloggerCourse.com  Fact Sheet for Healthcare Providers:  SeriousBroker.it  This test is no t yet approved or cleared by the [place] FDA and  has been authorized for detection and/or diagnosis of SARS-CoV-2 by FDA under an Emergency Use Authorization (EUA). This EUA will remain  in effect (meaning this test can be used) for the duration of the COVID-19 declaration under Section 564(b)(1) of the Act, 21 U.S.C.section 360bbb-3(b)(1), unless the authorization is terminated  or revoked sooner.       Influenza A by PCR NEGATIVE NEGATIVE Final   Influenza B by PCR NEGATIVE NEGATIVE Final    Comment: (NOTE) The Xpert Xpress SARS-CoV-2/FLU/RSV plus assay is intended as an aid in the diagnosis of influenza from Nasopharyngeal swab specimens and should not be used as a sole basis for treatment. Nasal washings and aspirates are unacceptable for Xpert Xpress SARS-CoV-2/FLU/RSV testing.  Fact Sheet for Patients: BloggerCourse.com  Fact Sheet for Healthcare Providers: SeriousBroker.it  This test is not yet approved or cleared by the [place] FDA and has been authorized for detection and/or diagnosis of SARS-CoV-2 by FDA under an Emergency Use Authorization (EUA). This EUA will remain in effect (meaning this test can be used) for the duration of the COVID-19 declaration under Section 564(b)(1) of the Act, 21 U.S.C. section 360bbb-3(b)(1), unless the authorization is terminated or revoked.  Performed at [facility], [address] [place], [place] [address]   MRSA PCR Screening     Status: None   Collection Time: 03/22/21  3:08 AM   Specimen: Nasal Mucosa; Nasopharyngeal  Result Value Ref Range Status   MRSA by PCR NEGATIVE NEGATIVE Final    Comment:        The GeneXpert MRSA Assay (FDA approved for NASAL specimens only), is one component of a comprehensive MRSA colonization surveillance program. It is not intended to diagnose MRSA  infection nor to guide or monitor treatment for MRSA infections. Performed at [facility], [address], [place], [place] [address]      Time coordinating discharge: 35 minutes  SIGNED:   Glade Lloyd, MD  [facility] 03/22/2021, 11:46 AM

## 2021-03-22 NOTE — ED Notes (Signed)
ED TO INPATIENT HANDOFF REPORT  Name/Age/Gender Mark [name] 35 y.o. male  Code Status    Code Status Orders  (From admission, onward)         Start     Ordered   03/21/21 2358  Full code  Continuous        03/21/21 2358        Code Status History    Date Active Date Inactive Code Status Order ID Comments User Context   11/03/2020 1931 11/03/2020 2313 Full Code [id number]  Briscoe Deutscher, MD ED   09/02/2020 0848 10/01/2020 1705 Full Code [id number]  Minor, Vilinda Blanks, NP ED   06/21/2020 2356 06/26/2020 1946 Full Code [id number]  Hillary Bow, DO ED   04/22/2020 0126 04/22/2020 2004 Full Code [id number]  Briscoe Deutscher, MD Inpatient   04/10/2020 0054 04/10/2020 0945 Full Code [id number]  Briscoe Deutscher, MD ED   03/23/2020 1644 03/25/2020 2119 Full Code [id number]  Hughie Closs, MD ED   03/13/2020 1838 03/18/2020 1848 Full Code [id number]  Lenard Lance, FNP Inpatient   03/13/2020 1838 03/13/2020 1838 Full Code [id number]  Lenard Lance, FNP Inpatient   03/05/2020 0406 03/13/2020 1430 Full Code [id number]  Eduard Clos, MD ED   02/02/2020 0741 02/05/2020 1955 Full Code [id number]  Marinda Elk, MD ED   01/02/2020 2050 01/05/2020 1949 Full Code [id number]  Alveria Apley, PA-C ED   12/29/2019 1610 12/31/2019 1727 Full Code [id number]  Jonah Blue, MD ED   05/31/2018 1541 06/04/2018 2033 Full Code [id number]  Houston Siren, MD ED   12/29/2017 1415 12/31/2017 1930 Full Code [id number]  Ihor Austin, MD Inpatient   12/06/2017 1419 12/07/2017 1444 Full Code [id number]  Fayrene Helper, PA-C ED   09/10/2017 2230 09/26/2017 1554 Full Code [id number]  Audery Amel, MD Inpatient   09/10/2017 2230 09/10/2017 2230 Full Code [id number]  Audery Amel, MD Inpatient   09/09/2017 2004 09/10/2017 2153 Full Code [id number]  Bertrum Sol, MD Inpatient   08/22/2017 1605 08/29/2017 1613 Full Code [id number]  Haskell Riling, MD Inpatient   08/22/2017 1518 08/22/2017 1605 Full Code [id number]  Audery Amel, MD  Inpatient   08/22/2017 1518 08/22/2017 1518 Full Code [id number]  Audery Amel, MD Inpatient   08/18/2017 1725 08/22/2017 1515 Full Code [id number]  Marguarite Arbour, MD Inpatient   11/24/2015 1356 11/25/2015 1828 Full Code [id number]  Adrian Saran, MD Inpatient   10/23/2015 1012 10/24/2015 2027 Full Code [id number]  Alford Highland, MD ED   Advance Care Planning Activity      Home/SNF/Other Home  Chief Complaint Hyperglycemia [R73.9]  Level of Care/Admitting Diagnosis ED Disposition    ED Disposition Condition Comment   Admit  Hospital Area: [facility] [[id number]] Level of Care: Stepdown [14] Admit to SDU based on following criteria: Severe physiological/psychological symptoms:  Any diagnosis requiring assessment & intervention at least every 4 hours on  an ongoing basis to obtain desired patient outcomes including stability and rehabilitation May place patient in observation at [facility] or [facility] if equivalent level of care is available:: No Covid Evaluation: covid negative Diagnosis: Hype rglycemia [762263] Admitting Physician: Eduard Clos [phone] Attending Physician: Eduard Clos [phone]       Medical History Past Medical History:  Diagnosis Date  . Anxiety   . Bipolar disorder (HCC)   . Depression   . Diabetes mellitus without complication (HCC)   . Diabetic polyneuropathy associated  with type 1 diabetes mellitus (HCC)   . Hydrocephalus (HCC)   . Kidney stones   . Seizures (HCC)   . Truncal ataxia     Allergies Allergies  Allergen Reactions  . Mushroom Extract Complex Hives, Itching and Nausea And Vomiting  . Penicillins Anaphylaxis, Hives and Swelling    Has patient had a PCN reaction causing immediate rash, facial/tongue/throat swelling, SOB or lightheadedness with hypotension: Yes Has patient had a PCN reaction causing severe rash involving mucus membranes or skin necrosis: No Has patient had a PCN reaction that required  hospitalization: Yes Has patient had a PCN reaction occurring within the last 10 years: Yes If all of the above answers are "NO", then may proceed with Cephalosporin use. Tolerated ceftriaxone 03/24/20  . Sulfa Antibiotics Anaphylaxis and Hives  . Clindamycin/Lincomycin Hives    IV Location/Drains/Wounds Patient Lines/Drains/Airways Status    Active Line/Drains/Airways    Name Placement date Placement time Site Days   Peripheral IV 03/21/21 20 G Right Antecubital 03/21/21  2150  Antecubital  1   Peripheral IV 03/22/21 20 G Anterior;Right Hand 03/22/21  0040  Hand  less than 1          Labs/Imaging Results for orders placed or performed during the hospital encounter of 03/21/21 (from the past 48 hour(s))  CBG monitoring, ED     Status: Abnormal   Collection Time: 03/21/21  5:46 PM  Result Value Ref Range   Glucose-Capillary >600 (HH) 70 - 99 mg/dL    Comment: Glucose reference range applies only to samples taken after fasting for at least 8 hours.   Comment 1 Notify RN   Comprehensive metabolic panel     Status: Abnormal   Collection Time: 03/21/21  6:09 PM  Result Value Ref Range   Sodium 128 (L) 135 - 145 mmol/L   Potassium 4.5 3.5 - 5.1 mmol/L   Chloride 97 (L) 98 - 111 mmol/L   CO2 22 22 - 32 mmol/L   Glucose, Bld 748 (HH) 70 - 99 mg/dL    Comment: Glucose reference range applies only to samples taken after fasting for at least 8 hours. CRITICAL RESULT CALLED TO, READ BACK BY AND VERIFIED WITH: J.SMITH, RN AT 1917 ON 05.31.22 BY N.THOMPSON    BUN 18 6 - 20 mg/dL   Creatinine, Ser 4.70 0.61 - 1.24 mg/dL   Calcium 8.7 (L) 8.9 - 10.3 mg/dL   Total Protein 7.0 6.5 - 8.1 g/dL   Albumin 4.3 3.5 - 5.0 g/dL   AST 17 15 - 41 U/L   ALT 14 0 - 44 U/L   Alkaline Phosphatase 82 38 - 126 U/L   Total Bilirubin 1.5 (H) 0.3 - 1.2 mg/dL   GFR, Estimated >96 >28 mL/min    Comment: (NOTE) Calculated using the CKD-EPI Creatinine Equation (2021)    Anion gap 9 5 - 15    Comment:  Performed at [facility], [address] [place], [place] [address]  CBC with Differential     Status: None   Collection Time: 03/21/21  6:09 PM  Result Value Ref Range   WBC 8.1 4.0 - 10.5 K/uL   RBC 4.33 4.22 - 5.81 MIL/uL   Hemoglobin 13.5 13.0 - 17.0 g/dL   HCT 47.6 54.6 - 50.3 %   MCV 90.5 80.0 - 100.0 fL   MCH 31.2 26.0 - 34.0 pg   MCHC 34.4 30.0 - 36.0 g/dL   RDW 54.6 56.8 - 12.7 %  Platelets 247 150 - 400 K/uL   nRBC 0.0 0.0 - 0.2 %   Neutrophils Relative % 72 %   Neutro Abs 5.8 1.7 - 7.7 K/uL   Lymphocytes Relative 20 %   Lymphs Abs 1.6 0.7 - 4.0 K/uL   Monocytes Relative 4 %   Monocytes Absolute 0.4 0.1 - 1.0 K/uL   Eosinophils Relative 3 %   Eosinophils Absolute 0.2 0.0 - 0.5 K/uL   Basophils Relative 1 %   Basophils Absolute 0.0 0.0 - 0.1 K/uL   Immature Granulocytes 0 %   Abs Immature Granulocytes 0.02 0.00 - 0.07 K/uL    Comment: Performed at [facility], [address] [place], [place] [address]  Urinalysis, Routine w reflex microscopic     Status: Abnormal   Collection Time: 03/21/21  7:53 PM  Result Value Ref Range   Color, Urine STRAW (A) YELLOW   APPearance CLEAR CLEAR   Specific Gravity, Urine 1.026 1.005 - 1.030   pH 6.0 5.0 - 8.0   Glucose, UA >=500 (A) NEGATIVE mg/dL   Hgb urine dipstick NEGATIVE NEGATIVE   Bilirubin Urine NEGATIVE NEGATIVE   Ketones, ur 20 (A) NEGATIVE mg/dL   Protein, ur NEGATIVE NEGATIVE mg/dL   Nitrite NEGATIVE NEGATIVE   Leukocytes,Ua NEGATIVE NEGATIVE   RBC / HPF 0-5 0 - 5 RBC/hpf   WBC, UA 0-5 0 - 5 WBC/hpf   Bacteria, UA NONE SEEN NONE SEEN    Comment: Performed at [facility], [address] [place], [place] [address]  Blood gas, venous (at [facility] and [facility], not at [facility])     Status: Abnormal   Collection Time: 03/21/21  9:50 PM  Result Value Ref Range   pH, Ven 7.325 7.250 - 7.430   pCO2, Ven 44.2 44.0 - 60.0 mmHg   pO2, Ven 28.8 (LL) 32.0 - 45.0 mmHg     Comment: CRITICAL RESULT CALLED TO, READ BACK BY AND VERIFIED WITH: Irie Fiorello, RN @ 2159 ON 03/21/21 C VARNER    Bicarbonate 22.4 20.0 - 28.0 mmol/L   Acid-base deficit 3.1 (H) 0.0 - 2.0 mmol/L   O2 Saturation 49.3 %   Patient temperature 98.6     Comment: Performed at [facility], [address] [place], [place] [address]  CBG monitoring, ED     Status: Abnormal   Collection Time: 03/21/21 10:22 PM  Result Value Ref Range   Glucose-Capillary >600 (HH) 70 - 99 mg/dL    Comment: Glucose reference range applies only to samples taken after fasting for at least 8 hours.  POC CBG, ED     Status: Abnormal   Collection Time: 03/21/21 11:14 PM  Result Value Ref Range   Glucose-Capillary >600 (HH) 70 - 99 mg/dL    Comment: Glucose reference range applies only to samples taken after fasting for at least 8 hours.  CBC with Differential (PNL)     Status: Abnormal   Collection Time: 03/21/21 11:40 PM  Result Value Ref Range   WBC 9.7 4.0 - 10.5 K/uL   RBC 4.05 (L) 4.22 - 5.81 MIL/uL   Hemoglobin 12.6 (L) 13.0 - 17.0 g/dL   HCT 91.436.8 (L) 78.239.0 - 95.652.0 %   MCV 90.9 80.0 - 100.0 fL   MCH 31.1 26.0 - 34.0 pg   MCHC 34.2 30.0 - 36.0 g/dL   RDW 21.311.9 08.611.5 - 57.815.5 %   Platelets 228 150 - 400 K/uL   nRBC 0.0 0.0 - 0.2 %  Neutrophils Relative % 73 %   Neutro Abs 7.0 1.7 - 7.7 K/uL   Lymphocytes Relative 24 %   Lymphs Abs 2.4 0.7 - 4.0 K/uL   Monocytes Relative 1 %   Monocytes Absolute 0.1 0.1 - 1.0 K/uL   Eosinophils Relative 1 %   Eosinophils Absolute 0.1 0.0 - 0.5 K/uL   Basophils Relative 0 %   Basophils Absolute 0.0 0.0 - 0.1 K/uL   Immature Granulocytes 1 %   Abs Immature Granulocytes 0.10 (H) 0.00 - 0.07 K/uL    Comment: Performed at [facility], [address], [place], [place] [address]  CBG monitoring, ED     Status: Abnormal   Collection Time: 03/22/21  1:47 AM  Result Value Ref Range   Glucose-Capillary 242 (H) 70 - 99 mg/dL    Comment: Glucose  reference range applies only to samples taken after fasting for at least 8 hours.   No results found.  Pending Labs Unresulted Labs (From admission, onward)          Start     Ordered   03/22/21 0500  Basic metabolic panel  (Hyperglycemia (not DKA or HHS))  Daily,   STAT      03/21/21 2358   03/21/21 2358  HIV Antibody (routine testing w rflx)  (HIV Antibody (Routine testing w reflex) panel)  Once,   STAT        03/21/21 2358   03/21/21 2358  Hemoglobin A1c  (Hyperglycemia (not DKA or HHS))  Once,   STAT       Comments: To assess prior glycemic control.    03/21/21 2358   03/21/21 2340  Osmolality  (Hyperglycemic Hyperosmolar State (HHS))  Once,   STAT        03/21/21 2340          Vitals/Pain Today's Vitals   03/21/21 2030 03/21/21 2106 03/21/21 2242 03/21/21 2300  BP: 124/68 121/76 109/61   Pulse: 87 82 87   Resp: 15 14 15    Temp:      TempSrc:      SpO2: 95% 98% 99%   Weight:    103.9 kg  PainSc:        Isolation Precautions No active isolations  Medications Medications  potassium chloride 10 mEq in 100 mL IVPB (10 mEq Intravenous New Bag/Given 03/22/21 0127)  insulin regular, human (MYXREDLIN) 100 units/ 100 mL infusion (0 Units/hr Intravenous Stopped 03/22/21 0148)  lactated ringers infusion ( Intravenous New Bag/Given 03/22/21 0006)  dextrose 5 % in lactated ringers infusion ( Intravenous New Bag/Given 03/22/21 0151)  dextrose 50 % solution 0-50 mL (has no administration in time range)  insulin aspart (novoLOG) injection 10 Units (10 Units Subcutaneous Given 03/21/21 2223)  sodium chloride 0.9 % bolus 1,000 mL (0 mLs Intravenous Stopped 03/21/21 2320)  ondansetron (ZOFRAN) injection 4 mg (4 mg Intravenous Given 03/21/21 2220)  insulin aspart (novoLOG) injection 10 Units (10 Units Intravenous Given 03/21/21 2354)  lactated ringers bolus 2,078 mL (2,078 mLs Intravenous Bolus from Bag 03/22/21 0007)    Mobility walks

## 2021-03-22 NOTE — Progress Notes (Signed)
D/c instructions reviewed with pt. Pt's only belongings at bedside are clothes. Pt denies having other valuables/belongings, wallet or cell phone with him upon admission. PIV's removed. Pt will be transported via w/c to front entrance. CM will call [employer] for transport.

## 2021-03-22 NOTE — Progress Notes (Signed)
Pt has been verbally aggressive and hostile toward staff with staff interventions. Pt refused to have blood sugar checked around 10:30, just kept yelling "stop" with profane remarks. RN returned to room around 10:45 to give insulin and attempt to check blood sugar again. Pt reacted the same with yelling and profanity. Charge RN came to room and assisted with calming pt and enabling RN to complete tasks. Door will be left open with all interactions and staff distress button is on hand.

## 2021-03-23 ENCOUNTER — Other Ambulatory Visit: Payer: 59

## 2021-03-23 ENCOUNTER — Telehealth: Payer: Self-pay

## 2021-03-23 ENCOUNTER — Other Ambulatory Visit: Payer: Self-pay | Admitting: *Deleted

## 2021-03-23 LAB — HEMOGLOBIN A1C
Hgb A1c MFr Bld: 8.4 % — ABNORMAL HIGH (ref 4.8–5.6)
Mean Plasma Glucose: 194 mg/dL

## 2021-03-23 NOTE — Patient Outreach (Signed)
[facility]) Care Management  [facility] Care Manager  03/23/2021   Mark [name] 1986/09/28 [id number]  Subjective: Incoming call from patient, stating that his ride with [employer] didn't show up on today for his appointment he rescheduled his appointment and was requesting assistance with rescheduling transportation for appointment for EEG on 6/6.  Discussed with patient recent hospital admission due to hyperglycemia. He states that he ran out of insulin and Mark [name] didn't get paid until 6/3. He states that he does have insulin as  they gave him a vial at discharge from hospital and Mark [name] will get refill at [facility] on 70/30 insulin. He states that he does have new  insulin syringes for use . He states that he does not have any other prescriptions and does have funds  for co-payments.    Objective:   Encounter Medications:  Outpatient Encounter Medications as of 03/23/2021  Medication Sig  . acetaminophen (TYLENOL) 325 MG tablet Take 2 tablets (650 mg total) by mouth every 6 (six) hours as needed for mild pain, fever or headache.  . escitalopram (LEXAPRO) 10 MG tablet Take 1 tablet (10 mg total) by mouth daily.  Marland Kitchen gabapentin (NEURONTIN) 100 MG capsule Take 1 capsule (100 mg total) by mouth 2 (two) times daily.  Marland Kitchen glucose blood (FREESTYLE LITE) test strip 1 each by Other route in the morning, at noon, and at bedtime. And lancets 3/day  . insulin NPH-regular Human (NOVOLIN 70/30 RELION) (70-30) 100 UNIT/ML injection 3 times a day (just before each meal) 45-35-25 units, and syringes 1/day  . Insulin Syringe-Needle U-100 30G X 1/2" 0.5 ML MISC USE AS DIRECTED  . Lancets MISC 1 application by Does not apply route daily.  Marland Kitchen levETIRAcetam (KEPPRA) 500 MG tablet Take 1 tablet (500 mg total) by mouth 2 (two) times daily.  Marland Kitchen levothyroxine (SYNTHROID) 50 MCG tablet Take 1 tablet (50 mcg total) by mouth daily at 6 (six) AM.  . ondansetron (ZOFRAN ODT) 8 MG disintegrating tablet Take 1  tablet (8 mg total) by mouth every 8 (eight) hours as needed for nausea or vomiting.  . pyridOXINE (B-6) 50 MG tablet Take 1 tablet (50 mg total) by mouth daily.  . QUEtiapine (SEROQUEL) 100 MG tablet Take 1 tablet (100 mg total) by mouth daily.  . QUEtiapine (SEROQUEL) 200 MG tablet Take 1 tablet (200 mg total) by mouth at bedtime.   No facility-administered encounter medications on file as of 03/23/2021.    Functional Status:  In your present state of health, do you have any difficulty performing the following activities: 03/22/2021 04/22/2020  Hearing? N N  Vision? N N  Difficulty concentrating or making decisions? N N  Walking or climbing stairs? Y Y  Dressing or bathing? N Y  Doing errands, shopping? N N  Some recent data might be hidden    Fall/Depression Screening: Fall Risk  12/29/2020 12/05/2020  Falls in the past year? 1 1  Number falls in past yr: 1 1  Injury with Fall? 1 1  Risk for fall due to : Impaired balance/gait;Impaired mobility Impaired balance/gait;Impaired mobility;History of fall(s)  Follow up Falls evaluation completed Falls evaluation completed   PHQ 2/9 Scores 02/22/2021 01/09/2021 12/29/2020 12/05/2020 12/01/2020  PHQ - 2 Score 2 2 2 2 2   PHQ- 9 Score 13 5 6 6 6     Assessment:  Goals Addressed            This Visit's Progress   . Make and Keep All  Appointments   On track    Timeframe:  Long-Range Goal Priority:  High Start Date:   11/23/20                          Expected End Date:   04/20/21                 Follow Up Date: 03/29/21    - call to cancel if needed - keep a calendar with appointment dates  Reinforced keeping all appointments    Why is this important?    Part of staying healthy is seeing the doctor for follow-up care.   If you forget your appointments, there are some things y. you can do to stay on track.   Barriers: Health Behaviors Knowledge Other, forgetful  Notes: with 3 way call made contact with [employer] transportation,  to  arrange transportation for patient next appointment on 6/6 for EEG, allowing patient to provide information , encouraged writing down appointment date time and arrival time for pick, Veriifed patient [facility] contact number stored in his phone. Assisted with scheduling PCP post hospital visit and provider in office visit . Teachback on date and reinforced writing down. 3 way call to Contact to [employer] [facility] able to leave message to contact patient/or myself regarding scheduling follow up appointment     . Manage My Medicine   Not on track    Timeframe:  Long-Range Goal Priority:  Medium Start Date:   11/23/20                          Expected End Date:   05/20/21                  Follow Up Date: 03/29/21   - call for medicine refill 2 or 3 days before it runs out - keep a list of all the medicines I take; vitamins and herbals too  Encouraged taking medications as prescribed.    Why is this important?   These steps will help you keep on track with your medicines.  .Barriers: Financial  Notes: Reinforced importance of taking all medications, verified having insulin on hand, encouraged picking up prescriptions for no cost medications. Will continue to explore options for patient prescription copayment issues    . Monitor and Manage My Blood Sugar-Diabetes Type 1   Not on track    Timeframe:  Long-Range Goal Priority:  High Start Date:   12/13/20                          Expected End Date:   05/20/21                   Follow Up Date 04/20/21   - check blood sugar if I feel it is too high or too low reinforced    Why is this important?    Checking your blood sugar at home helps to keep it from getting very high or very low.   Writing the results in a diary or log helps the doctor know how to care for you.   Your blood sugar log should have the time, the date and the results.   Also, write down the amount of insulin or other medicine you take.   Other information like what  you ate, exercise done and how you were feeling will also be helpful.Marland Kitchen  Barriers:  Financial    Notes: Does not have monitor for checking blood sugars due to cost. Continue follow up on progress with disability application , [facility]     . Set My Target A1C   Not on track    Timeframe:  Short-Term Goal Priority:  Medium Start Date:   12/13/20                          Expected End Date:  01/18/21                     Follow Up Date : 04/20/21   - set target A1C    Why is this important?    Your target A1C is decided together by you and your doctor.   It is based on several things like your age and other health issues.    Notes:  Not working on this goal.         Plan:  Follow-up:  Patient agrees to Care Plan and Follow-up.Scheduled follow up call in the next week.  Follow up call to Mark [name] , Care Coordinator with [facility] that outreached patient today for TOC. Discussed concern with patient copayment cost, she mentioned program of patient being able to use [facility] and charge the co-pays.  Placed call to [facility] pharmacy  spoke with representative he states provider could send in current  prescriptions to [facility] pharmacy or [facility] pharmacy [facility] transfer [facility] from [facility].  Placed call to patient to discuss program at [facility] pharmacy of being able to charge  co-payments and he will be sent a bill , he is willing to speak with pharmacy about copayment cost and requirements for paying off the charges, He is agreeable to return call on tomorrow after this provider post hospital virtual visit.  Will send  PCP this visit note.    [name], RN, BSN  [facility] Care Management,Care Management Coordinator  [phone]- Mobile [phone]- Toll Free Main Office

## 2021-03-23 NOTE — Telephone Encounter (Signed)
Copied from CRM [id number]. Topic: General - Other >> Mar 23, 2021  3:02 PM Marylen Ponto wrote: Reason for CRM: [name] with [facility] requests Robyne Peers return her call at [phone]

## 2021-03-23 NOTE — Telephone Encounter (Signed)
Transition Care Management Follow-up Telephone Call  Date of discharge and from where: 03/22/2021, [facility]  How have you been since you were released from the hospital? He said he is not feeling too good and this was not a good time to talk.  Reminded him that he has an appointment with Dr Earlene Plater tomorrow 03/24/2021 @ 1110 and he said he was aware. Instructed him to call this CM back when he has time to talk.

## 2021-03-23 NOTE — Telephone Encounter (Signed)
Call returned to [name], RN/THN. She spoke about patient's difficulty affording his medication co-pays so he does not have any medication except an insulin vial he was given in the hospital.  Explained to her that if patient was using [facility] he would be able to charge the co-pays. The pharmacy would speak to him about the requirements for paying off the charges.  He would not be eligible for a one time free fill.  Kim to follow up with patient and [facility] if more information is needed specific to patient's medication costs.

## 2021-03-24 ENCOUNTER — Telehealth (INDEPENDENT_AMBULATORY_CARE_PROVIDER_SITE_OTHER): Payer: 59 | Admitting: Internal Medicine

## 2021-03-24 ENCOUNTER — Other Ambulatory Visit: Payer: Self-pay | Admitting: *Deleted

## 2021-03-24 ENCOUNTER — Other Ambulatory Visit: Payer: Self-pay

## 2021-03-24 DIAGNOSIS — G40909 Epilepsy, unspecified, not intractable, without status epilepticus: Secondary | ICD-10-CM | POA: Diagnosis not present

## 2021-03-24 DIAGNOSIS — Z09 Encounter for follow-up examination after completed treatment for conditions other than malignant neoplasm: Secondary | ICD-10-CM

## 2021-03-24 DIAGNOSIS — E1065 Type 1 diabetes mellitus with hyperglycemia: Secondary | ICD-10-CM | POA: Diagnosis not present

## 2021-03-24 DIAGNOSIS — F3160 Bipolar disorder, current episode mixed, unspecified: Secondary | ICD-10-CM | POA: Diagnosis not present

## 2021-03-24 DIAGNOSIS — E038 Other specified hypothyroidism: Secondary | ICD-10-CM

## 2021-03-24 DIAGNOSIS — F411 Generalized anxiety disorder: Secondary | ICD-10-CM

## 2021-03-24 DIAGNOSIS — F99 Mental disorder, not otherwise specified: Secondary | ICD-10-CM

## 2021-03-24 DIAGNOSIS — F5105 Insomnia due to other mental disorder: Secondary | ICD-10-CM

## 2021-03-24 MED ORDER — LEVETIRACETAM 500 MG PO TABS
500.0000 mg | ORAL_TABLET | Freq: Two times a day (BID) | ORAL | 0 refills | Status: DC
  Filled 2021-03-24 – 2021-04-07 (×3): qty 60, 30d supply, fill #0

## 2021-03-24 MED ORDER — ACCU-CHEK GUIDE VI STRP
ORAL_STRIP | 12 refills | Status: DC
  Filled 2021-03-24: qty 100, 25d supply, fill #0
  Filled 2021-03-24: qty 50, 15d supply, fill #0
  Filled 2021-04-07: qty 100, 25d supply, fill #0

## 2021-03-24 MED ORDER — LEVOTHYROXINE SODIUM 50 MCG PO TABS
50.0000 ug | ORAL_TABLET | Freq: Every day | ORAL | 0 refills | Status: DC
  Filled 2021-03-24 – 2021-04-07 (×3): qty 30, 30d supply, fill #0

## 2021-03-24 MED ORDER — NOVOLIN 70/30 (70-30) 100 UNIT/ML ~~LOC~~ SUSP
SUBCUTANEOUS | 0 refills | Status: DC
  Filled 2021-03-24: qty 40, 38d supply, fill #0

## 2021-03-24 MED ORDER — QUETIAPINE FUMARATE 100 MG PO TABS
100.0000 mg | ORAL_TABLET | Freq: Every day | ORAL | 0 refills | Status: DC
  Filled 2021-03-24 – 2021-04-07 (×2): qty 30, 30d supply, fill #0

## 2021-03-24 MED ORDER — LANCETS MISC
1.0000 "application " | Freq: Every day | 0 refills | Status: DC
  Filled 2021-03-24: qty 100, 90d supply, fill #0
  Filled 2021-04-07: qty 100, 25d supply, fill #0

## 2021-03-24 MED ORDER — ESCITALOPRAM OXALATE 10 MG PO TABS
10.0000 mg | ORAL_TABLET | Freq: Every day | ORAL | 0 refills | Status: DC
  Filled 2021-03-24 – 2021-04-07 (×3): qty 30, 30d supply, fill #0

## 2021-03-24 MED ORDER — QUETIAPINE FUMARATE 200 MG PO TABS
200.0000 mg | ORAL_TABLET | Freq: Every day | ORAL | 0 refills | Status: DC
  Filled 2021-03-24 – 2021-04-07 (×3): qty 30, 30d supply, fill #0

## 2021-03-24 MED ORDER — ACCU-CHEK GUIDE ME W/DEVICE KIT
1.0000 | PACK | Freq: Every day | 0 refills | Status: DC
  Filled 2021-03-24: qty 1, 30d supply, fill #0
  Filled 2021-04-07: qty 1, 1d supply, fill #0

## 2021-03-24 NOTE — Patient Outreach (Signed)
[facility]) Care Management  03/24/2021  Mark [name] Feb 06, 1986 [id number]   Care Coordination   Successful outreach call to patient, he reports being fine discussed telephone visit with MD on this am.  I sent secure chat message to provider this am discussing patient concern with having finance on hand for medication co-payments. I discussed system at [facility] that patient can be billed and he expressed interest at phone visit on yesterday and agreeable to follow up call to assist after visit with Provider on today. Discussed  Via chat with Dr. Earlene Plater patient would need prescription list to [facility] pharmacy, including diabetes meter and supplies.  Patient discussed desire to proceed with getting prescriptions filled at [facility] pharmacy , except he [facility] no [facility]ment  And continue to get  70/30 insulin at [facility] due to cost.  Placed call to [facility] pharmacy with patient on call prescription refills requested, patient to be billed co-payment cost, patient voiced understanding when speaking with pharmacy  Representative. Patient requested information on total cost of prescriptions, informed cost of $69.72 to be billed.  Patient roommate on speaker phone also during the call agrees to pick up patient medication on 03/27/21.   Plan Will plan follow up call to patient in the next week to verify he has picked up prescriptions.    [name], RN, BSN  [facility] Care Management,Care Management Coordinator  [phone]- Mobile [phone]- Toll Free Main Office

## 2021-03-24 NOTE — Progress Notes (Signed)
Leg pain, constant, 6/10  Takes gabapentin  Medication RF seroquel  lexapro Gabapentin  Cane/walker  Discuss Seroquel intake-  GAD-7  PHQ-9

## 2021-03-24 NOTE — Progress Notes (Signed)
Virtual Visit via MyChart Note  I connected with Mark [name], on 03/24/2021 at 10:15 AM by MyChart Video due to the COVID-19 pandemic and verified that I am speaking with the correct person using two identifiers.   Consent: I discussed the limitations, risks, security and privacy concerns of performing an evaluation and management service by telephone and the availability of in person appointments. I also discussed with the patient that there may be a patient responsible charge related to this service. The patient expressed understanding and agreed to proceed.   Location of Patient: Home   Location of Provider: Clinic    Persons participating in Telemedicine visit: Mark [name] [facility] Dr. Juleen China   History of Present Illness: Patient has a visit for hospital d/c f/u. Was hospitalized from 5/31-6/1 for uncontrolled T1DM with severe hyperglycemia 2/2 to noncompliance with medications. Has follow up with neurology on 6/6. Has f/u with endo in July. Reports that he has not yet picked up his medications but is going to get them today. Has not missed any of his insulin doses since leaving hospital. He takes 35u TID. He does not have a meter or monitoring supplies. He is unsure when he sees his psychiatrist next. Says memory is terrible. His co home owner writes down appointments for him and helps him to remember. This person got on video visit and reported follow up appointment was June 8 with psych.   Depression screen [facility] 2/9 03/24/2021 01/09/2021 12/29/2020  Decreased Interest _0 Down, Depressed, Hopeless _1 PHQ - 2 Score _2 Altered sleeping _3 Tired, decreased energy _4 Change in appetite 1 1 -  Feeling bad or failure about yourself  2 0 0  Trouble concentrating 2 0 0  Moving slowly or fidgety/restless 1 0 0  Suicidal thoughts 0 0 0  PHQ-9 Score _5 Difficult doing work/chores - Somewhat difficult -  Some encounter information is confidential  and restricted. Go to Review Flowsheets activity to see all data.   GAD 7 : Generalized Anxiety Score 03/24/2021 12/29/2020 12/05/2020  Nervous, Anxious, on Edge 3 0 0  Control/stop worrying 3 3 0  Worry too much - different things 3 3 0  Trouble relaxing 3 1 0  Restless 2 - 0  Easily annoyed or irritable 3 0 0  Afraid - awful might happen 3 0 0  Total GAD 7 Score 20 - 0  Some encounter information is confidential and restricted. Go to Review Flowsheets activity to see all data.      Discharge Diagnoses:   Uncontrolled diabetes mellitus type 1 with severe hyperglycemia secondary to noncompliance with medications -Patient has not been taking his meds for the last 2 weeks.  Presented with severe hyperglycemia with blood sugar of 748 with anion gap of 9. -He was started on IV fluids and insulin drip.  During the hospitalization, his blood sugars have much improved.  He has been put on a diet and he is tolerating it.  He will be transitioned to 70/30 insulin and subsequently insulin drip will be turned off.  If he remains stable, he will be discharged home with outpatient follow-up with PCP/endocrinology/neurology/psychiatry. -Patient was recently seen by endocrinology as an outpatient with recommendations of 70/30 insulin 3 times a day.  We will continue the same regimen.  Patient is to be compliant with medications and follow-up.  Diabetes coordinator evaluation appreciated.  History  of seizure disorder and bipolar disorder -Patient was recently seen by neurology as an outpatient and Depakote was discontinued and patient was started on Lamictal.  He has not taken Lamictal.  Case was discussed with on-call neurology/Dr. Cheral Marker by admitting provider who recommended to start Keppra 500 mg twice a day.  Continue Keppra on discharge.  Outpatient follow-up with neurology.  -Continue Seroquel and escitalopram for his bipolar disorder.  Outpatient follow-up with psychiatry at earliest  convenience  History of hydrocephalus -Outpatient follow-up with neurology  Acute renal failure Dehydration -Treated with IV fluids.  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  Outpatient follow-up with PCP.  Hypothyroidism -Continue with Synthroid   Past Medical History:  Diagnosis Date  . Anxiety   . Bipolar disorder ([place])   . Depression   . Diabetes mellitus without complication ([place])   . Diabetic polyneuropathy associated with type 1 diabetes mellitus ([place])   . Hydrocephalus ([place])   . Kidney stones   . Seizures ([place])   . Truncal ataxia    Allergies  Allergen Reactions  . Mushroom Extract Complex Hives, Itching and Nausea And Vomiting  . Penicillins Anaphylaxis, Hives and Swelling    Has patient had a PCN reaction causing immediate rash, facial/tongue/throat swelling, SOB or lightheadedness with hypotension: Yes Has patient had a PCN reaction causing severe rash involving mucus membranes or skin necrosis: No Has patient had a PCN reaction that required hospitalization: Yes Has patient had a PCN reaction occurring within the last 10 years: Yes If all of the above answers are "NO", then may proceed with Cephalosporin use. Tolerated ceftriaxone 03/24/20  . Sulfa Antibiotics Anaphylaxis and Hives  . Clindamycin/Lincomycin Hives    Current Outpatient Medications on File Prior to Visit  Medication Sig Dispense Refill  . acetaminophen (TYLENOL) 325 MG tablet Take 2 tablets (650 mg total) by mouth every 6 (six) hours as needed for mild pain, fever or headache.    . escitalopram (LEXAPRO) 10 MG tablet Take 1 tablet (10 mg total) by mouth daily. 30 tablet 0  . gabapentin (NEURONTIN) 100 MG capsule Take 1 capsule (100 mg total) by mouth 2 (two) times daily. 60 capsule 0  . insulin NPH-regular Human (NOVOLIN 70/30  RELION) (70-30) 100 UNIT/ML injection 3 times a day (just before each meal) 45-35-25 units, and syringes 1/day 40 mL 0  . levothyroxine (SYNTHROID) 50 MCG tablet Take 1 tablet (50 mcg total) by mouth daily at 6 (six) AM. 30 tablet 0  . ondansetron (ZOFRAN ODT) 8 MG disintegrating tablet Take 1 tablet (8 mg total) by mouth every 8 (eight) hours as needed for nausea or vomiting. 20 tablet 0  . pyridOXINE (B-6) 50 MG tablet Take 1 tablet (50 mg total) by mouth daily. 30 tablet 0  . QUEtiapine (SEROQUEL) 100 MG tablet Take 1 tablet (100 mg total) by mouth daily. 30 tablet 0  . QUEtiapine (SEROQUEL) 200 MG tablet Take 1 tablet (200 mg total) by mouth at bedtime. 30 tablet 0  . glucose blood (FREESTYLE LITE) test strip 1 each by Other route in the morning, at noon, and at bedtime. And lancets 3/day 270 each 3  . Insulin Syringe-Needle U-100 30G X 1/2" 0.5 ML MISC USE AS DIRECTED 100 each 0  . Lancets MISC 1 application by Does not apply route daily. 100 each  0  . levETIRAcetam (KEPPRA) 500 MG tablet Take 1 tablet (500 mg total) by mouth 2 (two) times daily. (Patient not taking: Reported on 03/24/2021) 60 tablet 0   No current facility-administered medications on file prior to visit.    Observations/Objective: NAD. Speaking clearly.  Work of breathing normal.  Alert and oriented. Mood appropriate.   Assessment and Plan: 1. Hospital discharge follow-up Reviewed hospital course, current medications, ensured proper f/u in place, and addressed concerns.   2. Uncontrolled type 1 diabetes mellitus with hyperglycemia (HCC) Continue insulin NPH. Will order DM monitoring supplies so that can begin to work on better diabetic control. Last A1c 8.4 on 5/31.  - insulin NPH-regular Human (NOVOLIN 70/30 RELION) (70-30) 100 UNIT/ML injection; 3 times a day (just before each meal) 45-35-25 units, and syringes 1/day  Dispense: 40 mL; Refill: 0 - Blood Glucose Monitoring Suppl (ACCU-CHEK AVIVA PLUS) w/Device KIT; 1  Device by Does not apply route daily.  Dispense: 1 kit; Refill: 0 - Lancets MISC; 1 application by Does not apply route daily.  Dispense: 100 each; Refill: 0 - glucose blood (ACCU-CHEK AVIVA PLUS) test strip; Use as instructed  Dispense: 100 each; Refill: 12  3. Seizure disorder ([place]) Has not been taking Keppra. Rx sent to [facility] pharmacy. No seizure activity. Follow up with neuro next week as scheduled.  - levETIRAcetam (KEPPRA) 500 MG tablet; Take 1 tablet (500 mg total) by mouth 2 (two) times daily.  Dispense: 60 tablet; Refill: 0  4. Bipolar affective disorder, current episode mixed, current episode severity unspecified ([place]) Follow up with psych for medication management.  - escitalopram (LEXAPRO) 10 MG tablet; Take 1 tablet (10 mg total) by mouth daily.  Dispense: 30 tablet; Refill: 0 - QUEtiapine (SEROQUEL) 100 MG tablet; Take 1 tablet (100 mg total) by mouth daily.  Dispense: 30 tablet; Refill: 0 - QUEtiapine (SEROQUEL) 200 MG tablet; Take 1 tablet (200 mg total) by mouth at bedtime.  Dispense: 30 tablet; Refill: 0  5. Generalized anxiety disorder - escitalopram (LEXAPRO) 10 MG tablet; Take 1 tablet (10 mg total) by mouth daily.  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  Dispense: 30 tablet; Refill: 0  7. Other specified hypothyroidism - levothyroxine (SYNTHROID) 50 MCG tablet; Take 1 tablet (50 mcg total) by mouth daily at 6 (six) AM.  Dispense: 30 tablet; Refill: 0   Follow Up Instructions: 6 week f/u    I discussed the assessment and treatment plan with the patient. The patient was provided an opportunity to ask questions and all were answered. The patient agreed with the plan and demonstrated an understanding of the instructions.   The patient was advised to call back or seek an in-person evaluation if the symptoms worsen or if the condition fails to improve  as anticipated.     I provided 20 minutes total of non-face-to-face time during this encounter including median intraservice time, reviewing previous notes, investigations, ordering medications, medical decision making, coordinating care and patient verbalized understanding at the end of the visit.    Phill Myron, D.O. Primary Care at [facility]  03/24/2021, 10:15 AM

## 2021-03-27 ENCOUNTER — Other Ambulatory Visit: Payer: 59

## 2021-03-29 ENCOUNTER — Other Ambulatory Visit: Payer: Self-pay | Admitting: *Deleted

## 2021-03-29 NOTE — Patient Outreach (Signed)
[facility]) Care Management  03/29/2021  Mark [name] Mar 12, 1986 [id number]  Telephone Assessment   Referral received: 11/22/20 Referral source: [facility] Referral reason : 2 or more ED visits in January   Subjective: Placed scheduled  Outreach call to patient,he states this is not a good tine to talk, reviewed plan to assist with rescheduling neuro appointment and follow up on picking up prescriptions he request call on another day.    Plan  Will plan return call within the next 4 business days   [name], RN, BSN  [facility] Care Management,Care Management Coordinator  [phone]- Mobile [phone]- Toll Free Main Office

## 2021-03-30 ENCOUNTER — Other Ambulatory Visit: Payer: Self-pay | Admitting: *Deleted

## 2021-03-30 ENCOUNTER — Other Ambulatory Visit: Payer: Self-pay

## 2021-03-30 ENCOUNTER — Telehealth: Payer: Self-pay | Admitting: Neurology

## 2021-03-30 NOTE — Telephone Encounter (Signed)
[facility] Transportation [place]) called, need [facility] cost center number. Pt appt to non [place] facility. Would like a call from the nurse.  Contact info: [phone] ask for [facility]

## 2021-03-30 NOTE — Patient Outreach (Signed)
[facility]) Care Management  03/30/2021  Mark [name] 02/14/1986 [id number]   Care Coordination  Telephone Outreach   Subjective: Incoming call from patient requesting assistance with scheduling transportation for upcoming medical appointments and verify upcoming appointments. He discussed having difficulty remembering upcoming appointments dates and times and becoming frustrated. Patient states receiving incoming text message regarding appointment at [facility] health 6/9 at 0800 he requested assistance with arranging transportation if appointment in person.  Placed  3 way call with patient to [facility] health regarding appointment, able to leave a message regarding patient request to reschedule appointment for 6/9 and request return call to reschedule provided patient contact number.  Follow up: Received secure chat message from [facility] , verifying she was cancelling patient appointment for 6/10 and has placed call to patient to reschedule , she left voicemail message for patient to return call.   Appointments June 14 [facility] , 1050 MRI of Brain and then spine at [address] [facility].  Placed call to [facility] - transportation arranged patient pick up time 1020. Patient verifies having [facility] transportation contact number stored in his phone.  Pharmacy  Discussed with patient arranging  transportation to pick up medications at [facility] pharmacy on Friday , 6/10 ,he declines stating he can't do it , emphasized importance of taking medications , he is agreeable to arranging transportation after his appointment on 6/14 to pharmacy after completing scans. During call to [facility] Transportation it was explained by agency representative to  call [facility] transportation number after completing scan and  request ride to [facility]  pharmacy after MRI appointment , he is agreeable, his roommate [name] will  accompany him to appointments and on this call.  Provided contact information and address to pharmacy.  ID Card  Reminded patient and [name] of appointment on 6/16 to 2:15 at [facility] to get replacement ID. Provided address . Reminded to call [employer] to arrange transportation , patient/Mark [name] has contact number stored in phone.  PCP appointment June 04/07/21 1:30 Arranged transportation with [employer] , pickup time 1250.   Rescheduling missed appointments  Patient declined rescheduled missed EEG appointment on 03/27/21. Reviewing all upcoming appointment, roommate Mark [name] reviewed list of dates times locations.   Patient Care Plan: Diabetes Type 1 (Adult)     Problem Identified: Glycemic Management (Diabetes, Type 1)   Priority: High  Onset Date: 11/23/2020     Goal: Glycemic Management Optimized evidenced by patient report of monitoring blood sugar at least once daily over the next 30 days   Start Date: 12/28/2020  Expected End Date: 04/20/2021  Priority: High  Note:   Evidence-based guidance:  Anticipate A1C testing (point-of-care) every 3 to 6 months based on goal attainment.  Review mutually set A1C goal or target range.   Anticipate use of insulin with periodic adjustments; consider active involvement of pharmacist.  Compare self-reported symptoms of hypo or hyperglycemia to blood glucose levels, diet and fluid intake, current medications, psychosocial and physiologic stressors, change in activity and barriers to care adherence.  Promote self-monitoring of blood glucose levels, interval or continuous.  Barriers: Health Behaviors Financial   Notes: Not working on this goal at present    Task: Alleviate Barriers to Glycemic Managemen   Due Date: 04/20/2021  Note:   Care Management Activities:    - resources required to improve adherence to care identified - self-awareness of signs/symptoms of hypo or hyperglycemia encouraged  Notes: Discussed recent  follow  up with M regarding medicaid/disability, discussed insurance copayment with medication and supplies cost. Reviewed for recent episode of hypoglycemia, denies symptoms. Discussed symptoms of hyperglycemia and measures to control .    Problem Identified: Disease Progression (Diabetes, Type 1)   Priority: Medium     Long-Range Goal: Disease Progression Prevented or Minimized patient will maintain A1c at 7 range over the next 90 days   Start Date: 11/23/2020  Expected End Date: 05/20/2021  Priority: Medium  Note:   Evidence-based guidance:   . Prepare patient for consultation or referral for specialist care, such as ophthalmology, neurology, endocrinology.. Barriers: Health Behaviors Financial Knowledge Other   Memory issues  Note Reviewed current insulin as prescribed, Verified having insulin on hand .     Task: Monitor and Manage Follow-up for Comorbidities   Due Date: 05/20/2021  Priority: Routine  Note:   Care Management Activities:    - healthy lifestyle promoted - signs/symptoms of comorbidities identified  Limits sweet sodas.    Notes: Reinforced taking medication as prescribed, reviewed symptoms of hyperglycemia, denies today. No meter to monitor glucose. Encouraged limiting sweet soda and rationale. Discussed balanced diet foods he typically eats. Encouraged limiting time outside walking in the heat of the day.     Patient Care Plan: Fall Risk (Adult)     Problem Identified: Fall Risk      Long-Range Goal: Absence of Fall and Fall-Related Injury   Start Date: 12/28/2020  Expected End Date: 05/20/2021  Priority: Medium  Note:    Communicate fall injury risk to interprofessional healthcare team.  Develop a fall prevention plan with the patient and family   If fall occurs, determine the cause and revise fall injury prevention plan.  Regularly review medication contribution to fall risk; consider risk related to polypharmacy and age.  Barriers: Health  Behaviors Transportation  Notes: Reviewed neurology follow testing, reviewed appointments for testing.  Fall prevention reinforced, proper nutrition intake . Urged medication compliance.     Task: Identify and Manage Contributors to Fall Risk   Due Date: 05/20/2021  Priority: Routine  Note:   Care Management Activities:    Reinforced using walker/wheelchair , changing positions slowly- assistive or adaptive device use encouraged - fall prevention plan reviewed and updated    Notes:Encouraged use of assistive device when going for appointments to help with balance and prevent falls        Plan Patient agreeable with plan and follow up call in the next 2 weeks.  Will send [facility] calendar and AVS.    [name], RN, BSN  [facility] Care Management,Care Management Coordinator  [phone]- Mobile [phone]- Toll Free Main Office

## 2021-03-30 NOTE — Telephone Encounter (Signed)
I returned the call to [facility] to make him aware of Angie's response. He was actually calling about the patient's appt for MRI scans to [facility]. He said since Dr. Terrace Arabia is the ordering MD and the appt is medical related, they will cover the cost of his transporation.

## 2021-03-31 ENCOUNTER — Telehealth (HOSPITAL_COMMUNITY): Payer: 59 | Admitting: Physician Assistant

## 2021-03-31 ENCOUNTER — Other Ambulatory Visit: Payer: Self-pay

## 2021-04-04 ENCOUNTER — Other Ambulatory Visit: Payer: 59

## 2021-04-06 ENCOUNTER — Other Ambulatory Visit: Payer: Self-pay | Admitting: *Deleted

## 2021-04-06 NOTE — Progress Notes (Signed)
Patient ID: Mark [name], male    DOB: June 23, 1986  MRN: [id number]  CC: Diabetic Neuropathy  Subjective: Mark [name] is a 35 y.o. male who presents for diabetic neuropathy. He is accompanied by his best friend [name].   His concerns today include: Patient reports he is not sure why he is here for an appointment. Reports he needs Gabapentin refilled for diabetic neuropathy. Also, reports he received a call from the pharmacy stating that 7 prescriptions were ready for pickup and thinks Gabapentin may be one of the medications.    Patient Active Problem List   Diagnosis Date Noted   Hyperglycemia 03/21/2021   Hydrocephalus ([place]) 03/02/2021   Bipolar affective disorder, current episode mixed ([place]) 02/24/2021   Generalized anxiety disorder 02/24/2021   History of DVT of lower extremity 12/07/2020   Right leg DVT ([place]) 11/03/2020   Insomnia due to other mental disorder    Bipolar I disorder ([place])    Uncontrolled type 1 diabetes mellitus with hyperglycemia ([place])    Seizures ([place])    Bipolar I disorder, current or most recent episode depressed, in partial remission ([place])    MDD (major depressive disorder), recurrent episode, severe ([place]) 03/13/2020   Drug overdose, intentional ([place]) 03/05/2020   Drug abuse ([place]) 03/05/2020   Attempted suicide ([place]) 03/05/2020   Insulin overdose    Hypothyroidism 02/02/2020   Diabetic polyneuropathy associated with type 1 diabetes mellitus ([place]) 02/02/2020   Homelessness 12/29/2019   Marijuana abuse 12/29/2019   Tobacco dependence 12/29/2019   Seizure disorder ([place]) 12/29/2019   Suicidal ideation    Bipolar I disorder, most recent episode depressed, severe without psychotic features ([place])    Adjustment disorder with depressed mood 12/07/2017   Dysthymia 10/08/2017   Personality disorder in adult [facility]) 09/26/2017   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     Blood Glucose Monitoring Suppl (ACCU-CHEK GUIDE ME) w/Device KIT USE AS DIRECTED 1 kit 0   escitalopram (LEXAPRO) 10 MG tablet Take 1 tablet (10 mg total) by mouth daily. 30 tablet 0   gabapentin (NEURONTIN) 100 MG capsule Take 1 capsule (100 mg total) by mouth 2 (two) times daily. 60 capsule 0   glucose blood (ACCU-CHEK GUIDE) test strip USE AS DIRECTED 100 each 12   insulin NPH-regular Human (NOVOLIN 70/30) (70-30) 100 UNIT/ML injection INJECT 3 TIMES A DAY JUST BEFORE EACH MEAL 45-35-25 40 mL 0   Insulin Syringe-Needle U-100 30G X 1/2" 0.5 ML MISC USE AS DIRECTED 100 each 0   Lancets MISC USE AS DIRECTED 100 each 0   levETIRAcetam (KEPPRA) 500 MG tablet Take 1 tablet (500 mg total) by mouth 2 (two) times daily. 60 tablet 0   levothyroxine (SYNTHROID) 50 MCG tablet Take 1 tablet (50 mcg total) by mouth daily at 6 (six) AM. 30 tablet 0   ondansetron (ZOFRAN ODT) 8 MG disintegrating tablet Take 1 tablet (8 mg total) by mouth every 8 (eight) hours as needed for nausea or vomiting. 20 tablet 0   pyridOXINE (B-6) 50 MG tablet Take 1 tablet (50 mg total) by mouth  daily. 30 tablet 0   QUEtiapine (SEROQUEL) 100 MG tablet Take 1 tablet (100 mg total) by mouth daily. 30 tablet 0   QUEtiapine (SEROQUEL) 200 MG tablet Take 1 tablet (200 mg total) by mouth at bedtime. 30 tablet 0   No current facility-administered medications on file prior to visit.    Allergies  Allergen Reactions   Mushroom Extract Complex Hives, Itching and Nausea And Vomiting   Penicillins Anaphylaxis, Hives and Swelling    Has patient had a PCN reaction causing immediate rash, facial/tongue/throat  swelling, SOB or lightheadedness with hypotension: Yes Has patient had a PCN reaction causing severe rash involving mucus membranes or skin necrosis: No Has patient had a PCN reaction that required hospitalization: Yes Has patient had a PCN reaction occurring within the last 10 years: Yes If all of the above answers are "NO", then may proceed with Cephalosporin use. Tolerated ceftriaxone 03/24/20   Sulfa Antibiotics Anaphylaxis and Hives   Clindamycin/Lincomycin Hives    Social History   Socioeconomic History   Marital status: Single    Spouse name: Not on file   Number of children: 0   Years of education: 9th grade   Highest education level: Not on file  Occupational History   Occupation: seeking disability  Tobacco Use   Smoking status: Every Day    Packs/day: 1.00    Years: 20.00    Pack years: 20.00    Types: Cigarettes   Smokeless tobacco: Never  Vaping Use   Vaping Use: Never used  Substance and Sexual Activity   Alcohol use: Yes    Comment: occassional    Drug use: Yes    Frequency: 7.0 times per week    Types: Marijuana    Comment: smoke daily   Sexual activity: Not Currently  Other Topics Concern   Not on file  Social History Narrative   Lives with a roommate.   Right-handed.   Drinks 2 liter of soda each day.   Social Determinants of Health   Financial Resource Strain: High Risk   Difficulty of Paying Living Expenses: Very hard  Food Insecurity: Landscape architect Present   Worried About Charity fundraiser in the Last Year: Sometimes true   Arboriculturist in the Last Year: Sometimes true  Transportation Needs: Public librarian (Medical): Yes   Lack of Transportation (Non-Medical): Yes  Physical Activity: Not on file  Stress: Not on file  Social Connections: Not on file  Intimate Partner Violence: Not on file    Family History  Problem Relation Age of Onset   Breast cancer Mother    Other Father        unsure of  medical history   Diabetes Paternal Aunt     Past Surgical History:  Procedure Laterality Date   BRAIN SURGERY     hydrocephalus   KIDNEY STONE SURGERY      ROS: Review of Systems Negative except as stated above  PHYSICAL EXAM: BP 106/75 (BP Location: Left Arm, Patient Position: Sitting, Cuff Size: Large)   Pulse 86   Temp 98.1 F (36.7 C)   Resp 16   Ht 6' 2.02" (1.88 m)   Wt 215 lb 3.2 oz (97.6 kg)   SpO2 98%   BMI 27.62 kg/m   Physical Exam General appearance - alert, well appearing, and in no distress and oriented to person, place, and time Mental status - alert, oriented to person, place, and time,  normal mood, behavior, speech, dress, motor activity, and thought processes Chest - clear to auscultation, no wheezes, rales or rhonchi, symmetric air entry, no tachypnea, retractions or cyanosis Heart - normal rate, regular rhythm, normal S1, S2, no murmurs, rubs, clicks or gallops   ASSESSMENT AND PLAN: 1. Diabetic mononeuropathy associated with type 1 diabetes mellitus [facility]): - Patient reports he is not sure why he is here for an appointment. Reports he needs Gabapentin refilled for diabetic neuropathy. Also, reports he received a call from the pharmacy stating that 7 prescriptions were ready for pickup and thinks Gabapentin may be one of the medications.  - Counseled patient that Gabapentin (Neurontin) most recently ordered on 03/22/2021 by Aline August, MD and available at [facility]. Patient verbalized understanding.     Patient was given the opportunity to ask questions.  Patient verbalized understanding of the plan and was able to repeat key elements of the plan. Patient was given clear instructions to go to Emergency Department or return to medical center if symptoms don't improve, worsen, or new problems develop.The patient verbalized understanding.   Follow-up with primary provider as scheduled.    Camillia Herter, NP

## 2021-04-06 NOTE — Patient Outreach (Signed)
[facility]) Care Management  04/06/2021  GAYNOR FERRERAS 02/11/1986 [id number]   Care Coordination    Incoming call from patient requesting assistance with rescheduling appointment at [facility] for ID card  for today to get his ID , states he and roommate unable to afford  cost on today.  Assisted  with online scheduling of appointment at [facility] for ID card, for August 3rd per request for patient and his roommate.   Patient discussed having MD appointment on tomorrow, placed care coordination call to [facility] to verify time for pick up at his to his home. Discussed with patient being able to pick up his prescriptions at [facility]  pharm[facility] after MD appointment , discussed with [facility] transportation representative that states to call transportation service after visit complete with MD to arrange transportation to pharmacy.    Plan Will follow up with patient for next scheduled visit in a week.   [name], RN, BSN  [facility] Care Management,Care Management Coordinator  [phone]- Mobile [phone]- Toll Free Main Office

## 2021-04-07 ENCOUNTER — Other Ambulatory Visit: Payer: Self-pay | Admitting: *Deleted

## 2021-04-07 ENCOUNTER — Other Ambulatory Visit: Payer: Self-pay

## 2021-04-07 ENCOUNTER — Ambulatory Visit (INDEPENDENT_AMBULATORY_CARE_PROVIDER_SITE_OTHER): Payer: 59 | Admitting: Family

## 2021-04-07 VITALS — BP 106/75 | HR 86 | Temp 98.1°F | Resp 16 | Ht 74.02 in | Wt 215.2 lb

## 2021-04-07 DIAGNOSIS — E1041 Type 1 diabetes mellitus with diabetic mononeuropathy: Secondary | ICD-10-CM | POA: Diagnosis not present

## 2021-04-07 NOTE — Patient Outreach (Signed)
[facility]) Care Management  04/07/2021  PACE LAMADRID June 18, 1986 [id number]   Care Coordination   Incoming call from patient, states that he has completed appointment doctors office  he called [facility] for transportation to [facility] as prior discussed. Patient voiced being upset, confusion about situation per patient driver states they are unable to take him to pharmacy . Patient was provided transportation to home, placed call to [employer], representative able to arrange transportation from patient home  to [facility] pharmacy encouraged patient to contact me after he has picked up medication to assist with transportation to home.  Patient returned call to me , I assisted with call to [facility] Transportation for return ride to home. Patient provided the expected time of arrival,car color and description for ride home.  Patient states he was sorry for the way he spoke with transportation representative, he discussed again his history of brain injury and having memory problems. His roommate [name] that usually assist him and attends appointments was not able to go with him on today.  Patient states that he has received medicaid has copy.   Plan Will plan return already scheduled appointment in the next 2 business days for continued care management care coordination.   [name], RN, BSN  [facility] Care Management,Care Management Coordinator  [phone]- Mobile [phone]- Toll Free Main Office

## 2021-04-07 NOTE — Progress Notes (Signed)
Pt presents for med refill.

## 2021-04-11 ENCOUNTER — Other Ambulatory Visit: Payer: Self-pay | Admitting: *Deleted

## 2021-04-11 NOTE — Patient Outreach (Signed)
[facility]) Care Management  [place]  04/11/2021   Mark [name] 1986/09/23 [id number]  Telephone Assessment   Subjective:  Successful telephone outreach to patient , he reports doing okay. He acknowledged having prescriptions and taking as prescribed,declined reviewing medications at this time.  Patient has CBG meter , has not used he states trying to conserves strips due to cost urged use of monitor readings.  Patient discussed having medicaid now and want to assistance with knowing services available to him, will assist with a call.  He discussed concern regarding needing to renew food stamps as of this month.   Objective:   Encounter Medications:  Outpatient Encounter Medications as of 04/11/2021  Medication Sig   acetaminophen (TYLENOL) 325 MG tablet Take 2 tablets (650 mg total) by mouth every 6 (six) hours as needed for mild pain, fever or headache.   Blood Glucose Monitoring Suppl (ACCU-CHEK GUIDE ME) w/Device KIT USE AS DIRECTED   escitalopram (LEXAPRO) 10 MG tablet Take 1 tablet (10 mg total) by mouth daily.   gabapentin (NEURONTIN) 100 MG capsule Take 1 capsule (100 mg total) by mouth 2 (two) times daily.   glucose blood (ACCU-CHEK GUIDE) test strip USE AS DIRECTED   insulin NPH-regular Human (NOVOLIN 70/30) (70-30) 100 UNIT/ML injection INJECT 3 TIMES A DAY JUST BEFORE EACH MEAL 45-35-25   Insulin Syringe-Needle U-100 30G X 1/2" 0.5 ML MISC USE AS DIRECTED   Lancets MISC USE AS DIRECTED   levETIRAcetam (KEPPRA) 500 MG tablet Take 1 tablet (500 mg total) by mouth 2 (two) times daily.   levothyroxine (SYNTHROID) 50 MCG tablet Take 1 tablet (50 mcg total) by mouth daily at 6 (six) AM.   ondansetron (ZOFRAN ODT) 8 MG disintegrating tablet Take 1 tablet (8 mg total) by mouth every 8 (eight) hours as needed for nausea or vomiting.   pyridOXINE (B-6) 50 MG tablet Take 1 tablet (50 mg total) by mouth daily.   QUEtiapine (SEROQUEL) 100 MG tablet Take  1 tablet (100 mg total) by mouth daily.   QUEtiapine (SEROQUEL) 200 MG tablet Take 1 tablet (200 mg total) by mouth at bedtime.   No facility-administered encounter medications on file as of 04/11/2021.    Functional Status:  In your present state of health, do you have any difficulty performing the following activities: 03/22/2021 04/22/2020  Hearing? N N  Vision? N N  Difficulty concentrating or making decisions? N N  Walking or climbing stairs? Y Y  Dressing or bathing? N Y  Doing errands, shopping? N N  Some recent data might be hidden    Fall/Depression Screening: Fall Risk  03/24/2021 12/29/2020 12/05/2020  Falls in the past year? 1 1 1   Number falls in past yr: 1 1 1   Injury with Fall? 0 1 1  Risk for fall due to : Impaired balance/gait Impaired balance/gait;Impaired mobility Impaired balance/gait;Impaired mobility;History of fall(s)  Follow up Falls evaluation completed Falls evaluation completed Falls evaluation completed   PHQ 2/9 Scores 03/24/2021 02/22/2021 01/09/2021 12/29/2020 12/05/2020 12/01/2020  PHQ - 2 Score 3 2 2 2 2 2   PHQ- 9 Score 12 13 5 6 6 6     Assessment:   Care Plan Care Plan : Diabetes Type 1 (Adult)  Updates made by Alfonzo Feller, RN since 04/11/2021 12:00 AM     Problem: Glycemic Management (Diabetes, Type 1)   Priority: High  Onset Date: 11/23/2020     Goal: Glycemic Management Optimized evidenced by patient report of  monitoring blood sugar at least once daily over the next 30 days   Start Date: 12/28/2020  Expected End Date: 05/20/2021  Priority: High  Note:   Evidence-based guidance:  Anticipate A1C testing (point-of-care) every 3 to 6 months based on goal attainment.  Review mutually set A1C goal or target range.   Anticipate use of insulin with periodic adjustments; consider active involvement of pharmacist.  Compare self-reported symptoms of hypo or hyperglycemia to blood glucose levels, diet and fluid intake, current medications, psychosocial and  physiologic stressors, change in activity and barriers to care adherence.  Promote self-monitoring of blood glucose levels, interval or continuous.  Barriers: Health Behaviors Financial   Notes: Patient now has cbg meter and supplies reinforced checking blood sugars . Verified understanding of how to use meter     Task: Alleviate barrier to glycemic management   Due Date: 05/20/2021  Note:   Care Management Activities:    - resources required to improve adherence to care identified - self-awareness of signs/symptoms of hypo or hyperglycemia encouraged    Notes: . Advised regarding importance of checking blood sugar when having symptoms to identify and determine how to treat ,reviewed low blood sugar symptoms     Problem: Disease Progression (Diabetes, Type 1)   Priority: Medium     Long-Range Goal: Disease Progression Prevented or Minimized patient will maintain A1c at 7 range over the next 90 days   Start Date: 11/23/2020  Expected End Date: 05/20/2021  Priority: Medium  Note:   Evidence-based guidance:   . Prepare patient for consultation or referral for specialist care, such as ophthalmology, neurology, endocrinology.. Barriers: Health Behaviors Financial Knowledge Other   Memory issues  Note : Discussed endocrinologist plan for insulin dosing , urged taking as advised, to avoid low readings, and keep blood sugar controlled . Verified having insulin on hand     Task: Monitor and Manage Follow-up for Comorbidities   Due Date: 05/20/2021  Priority: Routine  Note:   Care Management Activities:    - healthy lifestyle promoted - signs/symptoms of comorbidities identified  Limits sweet sodas.    Notes: Discussed limits sweet soda to avoid elevated blood sugars. Discussed food choices in the home balanced meals and food intake , limit snacks     Care Plan : Fall Risk (Adult)  Updates made by Alfonzo Feller, RN since 04/11/2021 12:00 AM     Problem: Fall Risk       Long-Range Goal: Absence of Fall and Fall-Related Injury   Start Date: 12/28/2020  Expected End Date: 05/20/2021  Priority: Medium  Note:    Communicate fall injury risk to interprofessional healthcare team.  Develop a fall prevention plan with the patient and family   If fall occurs, determine the cause and revise fall injury prevention plan.  Regularly review medication contribution to fall risk; consider risk related to polypharmacy and age.  Barriers: Health Behaviors Transportation  Notes: reports recent fall no injury, reinforced importance of attending recommended test for medical plan .     Task: Identify and Manage Contributors to Fall Risk   Due Date: 05/20/2021  Priority: Routine  Note:   Care Management Activities:    Reinforced using walker/wheelchair , changing positions slowly- assistive or adaptive device use encouraged - fall prevention plan reviewed and updated    Notes:Reinforced use of assistive device when going for appointments to help with balance and prevent falls        Goals Addressed  This Visit's Progress    Find Help in My Community   On track    Timeframe:  Long-Range Goal Priority:  Medium Start Date:    04/11/21                         Expected End Date:06/20/21                       Follow Up Date 04/26/2021    - follow-up on any referrals for help I am given - think ahead to make sure my need does not become an emergency    Why is this important?   Knowing how and where to find help for yourself or family in your neighborhood and community is an important skill.  You will want to take some steps to learn how.    Notes: Assisted with call to DSS regarding being new to medicaid, identified case worker, patient provided contact number with readback. Patient to receive phone call from representative  regarding food stamps recertification .       Make and Keep All Appointments   Not on track    Timeframe:  Long-Range  Goal Priority:  High Start Date:   11/23/20                          Expected End Date:   04/20/21                 Follow Up Date: 04/26/21    - call to cancel if needed - keep a calendar with appointment dates  Reinforced keeping all appointments    Why is this important?   Part of staying healthy is seeing the doctor for follow-up care.  If you forget your appointments, there are some things y. you can do to stay on track.   Barriers: Health Behaviors Knowledge Other, forgetful  Notes: Missed appointment to be rescheduled, offered to assist with appointment rescheduled ,placed call to reschedule behavior health appointment he requested to reschedule one at a time. Behavorial health, EEG and CT scan appointments       Manage My Medicine   On track    Timeframe:  Long-Range Goal Priority:  Medium Start Date:   11/23/20                          Expected End Date:   05/20/21                  Follow Up Date: 04/26/21   - call for medicine refill 2 or 3 days before it runs out - keep a list of all the medicines I take; vitamins and herbals too  Encouraged taking medications as prescribed.    Why is this important?   These steps will help you keep on track with your medicines.  .Barriers: Financial  Notes: Patient reports having medication and taking as bottle label reads       Monitor and Manage My Blood Sugar-Diabetes Type 1   Not on track    Timeframe:  Long-Range Goal Priority:  High Start Date:   12/13/20                          Expected End Date:   05/20/21  Follow Up Date 04/26/21   - check blood sugar if I feel it is too high or too low reinforced    Why is this important?   Checking your blood sugar at home helps to keep it from getting very high or very low.  Writing the results in a diary or log helps the doctor know how to care for you.  Your blood sugar log should have the time, the date and the results.  Also, write down the amount of insulin or  other medicine you take.  Other information like what you ate, exercise done and how you were feeling will also be helpful..   Barriers: Financial    Notes: Patient now has cbg meter, supplies to monitor readings, verbalizes understanding how to use , encouraged use       Set My Target A1C       Timeframe:  Short-Term Goal Priority:  Medium Start Date:   12/13/20                          Expected End Date:  01/18/21                     Follow Up Date : 05/20/21   - set target A1C    Why is this important?   Your target A1C is decided together by you and your doctor.  It is based on several things like your age and other health issues.    Notes:  Urged taking insulin as prescribed to help with managing glucose and avoiding low blood sugar symptoms, denies recent low blood sugar readings or symptoms           Plan:  Follow-up: Patient agrees to Care Plan and Follow-up.will plan call in the next 2 weeks.    Joylene Draft, RN, BSN  [place] Management Coordinator  [phone]- Mobile [phone]- Toll Free Main Office

## 2021-04-12 ENCOUNTER — Other Ambulatory Visit: Payer: Self-pay | Admitting: *Deleted

## 2021-04-12 NOTE — Patient Outreach (Signed)
[facility]) Care Management  04/12/2021  NADER BOYS [birth year] [id number]  Care Coordination Call    Subjective:  Incoming call from Lyrica at [facility] in follow up to call to agency on yesterday. She states that she has made contact with patient in last week to reschedule missed appointment in last month.  She reports that patient has an virtual  appointment for June 29 at 1:30, discussed per conversation with patient on yesterday is was unable to recall appointment .   Placed return call to patient to inform of appointment for June 29 at 1:30 he verbalized understanding of reinforced writing down date and time .   Plan Will follow up with patient in the next  2-3 weeks as scheduled.    [name], RN, BSN  [facility] Care Management,Care Management Coordinator  [phone]- Mobile [phone]- Toll Free Main Office

## 2021-04-17 ENCOUNTER — Other Ambulatory Visit: Payer: Self-pay | Admitting: *Deleted

## 2021-04-17 NOTE — Patient Outreach (Signed)
[facility]) Care Management  04/17/2021  Mark [name] 03-28-1986 [id number]   Care Coordination   Subjective: Incoming call from patient requesting assistance on follow up with recertifying for food stamps. Patient unable to recall whether he as received return call from DSS from outreach contact we made on last week.  Placed call to Medicaid regarding food stamps with patient on the phone, representative discussed with patient that he could either come to office or complete paperwork by mail to renew for food stamps.  Patient states that he prefers to go to office and but he has transportation concerns he does not have money for [place] service, he is unable to walk from the bus stop, he is hoping to get to office sooner.Medicaid does not provide transportation to non medical appointments.   Placed call to [facility] health transportation regarding patient ride to social services regarding food stamps, Representative states they would need to have a cost center to charge ride to.   Plan Will place call /email to [facility] assistant director [name] regarding approval of cost center in order to arrange patient transportation to [facility].  Will plan follow up call to patient and transportation services email once cost center approval in the next business day.   [name], RN, BSN  [facility] Care Management,Care Management Coordinator  [phone]- Mobile [phone]- Toll Free Main Office

## 2021-04-18 ENCOUNTER — Other Ambulatory Visit: Payer: Self-pay | Admitting: *Deleted

## 2021-04-18 NOTE — Patient Outreach (Signed)
[facility]) Care Management  04/18/2021  [name] 1986-06-09 [id number]  Care Coordination    Subjective: Unsuccessful outreach call to patient to follow up on need regarding transportation to Social Services regarding [employer].   Discussed patient case with [facility] Assistant Clinical Director [name], agrees to providing cost center to [employer] to arrange ride to Social services for patient to renew Foods stamps as currently suspended.  Also discussed patient will benefit from bags of groceries available at [facility] office, will arrange transportation to patient home.   1200 Successful return call to patient , explained approval for transportation to social services, I have sent email transportation regarding patient time to arrive a [name]. Explained to patient SS does not take appointment when he arrives he should check in at desk to notify them of need to renew food stamps, patient agreeable to arrive by 1030 on tomorrow, also explained this to his roommate [name] that may accompany him. Patient states that he has contact number for [employer] for return ride home , encouraged to contact CM if he needs assistance.   Plan Will plan follow up call in the next 2 weeks, encouraged to call care coordinator sooner for new concerns, or coordination of return ride home after appointment.  Patient agreeable to arranging bags of grocery delivery to home will coordinate with [name] , [employer], [facility] care management .   Mark Garibaldi, Mark [name], Mark [name]  [facility] Care Management,Care Management Coordinator  [phone]- Mobile [phone]- Toll Free Main Office

## 2021-04-19 ENCOUNTER — Encounter (HOSPITAL_COMMUNITY): Payer: Self-pay | Admitting: Physician Assistant

## 2021-04-19 ENCOUNTER — Telehealth (INDEPENDENT_AMBULATORY_CARE_PROVIDER_SITE_OTHER): Payer: 59 | Admitting: Physician Assistant

## 2021-04-19 ENCOUNTER — Other Ambulatory Visit: Payer: Self-pay

## 2021-04-19 DIAGNOSIS — F99 Mental disorder, not otherwise specified: Secondary | ICD-10-CM | POA: Diagnosis not present

## 2021-04-19 DIAGNOSIS — F3160 Bipolar disorder, current episode mixed, unspecified: Secondary | ICD-10-CM | POA: Diagnosis not present

## 2021-04-19 DIAGNOSIS — F5105 Insomnia due to other mental disorder: Secondary | ICD-10-CM

## 2021-04-19 DIAGNOSIS — F411 Generalized anxiety disorder: Secondary | ICD-10-CM | POA: Diagnosis not present

## 2021-04-19 MED ORDER — QUETIAPINE FUMARATE 300 MG PO TABS
300.0000 mg | ORAL_TABLET | Freq: Every day | ORAL | 1 refills | Status: DC
  Filled 2021-04-19: qty 30, 30d supply, fill #0

## 2021-04-19 MED ORDER — ESCITALOPRAM OXALATE 20 MG PO TABS
20.0000 mg | ORAL_TABLET | Freq: Every day | ORAL | 1 refills | Status: DC
  Filled 2021-04-19: qty 30, 30d supply, fill #0

## 2021-04-19 NOTE — Progress Notes (Signed)
[place] [facility] MD/PA/NP OP Progress Note  Virtual Visit via Telephone Note  I connected with Mark [name] on 04/19/21 at  1:30 PM EDT by telephone and verified that I am speaking with the correct person using two identifiers.  Location: Patient: Home Provider: Clinic   I discussed the limitations, risks, security and privacy concerns of performing an evaluation and management service by telephone and the availability of in person appointments. I also discussed with the patient that there may be a patient responsible charge related to this service. The patient expressed understanding and agreed to proceed.  Follow Up Instructions:  I discussed the assessment and treatment plan with the patient. The patient was provided an opportunity to ask questions and all were answered. The patient agreed with the plan and demonstrated an understanding of the instructions.   The patient was advised to call back or seek an in-person evaluation if the symptoms worsen or if the condition fails to improve as anticipated.  I provided 20 minutes of non-face-to-face time during this encounter.  Mark Mood, PA    04/19/2021 9:16 PM Mark [name]  MRN:  [id number]  Chief Complaint: Follow up and medication management  HPI:   Mark [name] is a 35 year old male with a past psychiatric history significant for insomnia, generalized anxiety disorder, and bipolar disorder who presents to [facility] via virtual telephone visit for follow-up and medication management.  Patient is currently being managed on the following medications:  Seroquel 200 mg at bedtime Depakote 250 mg 3 times daily in the evening Escitalopram 10 mg daily  Patient reports that he is not taking his Depakote anymore.  He states that he was originally taking Depakote for his seizures but his primary care provider told him he could discontinue taking the medication.  Patient believes that his  Seroquel should be upped stating that it does not seem to be working well.  Patient still endorses lack of sleep and often feels drowsy.  Patient also endorses irritability and states that he feels manic periodically.  Patient endorses the following depressive symptoms: low [name], lack of motivation, decreased energy, feelings of worthlessness and guilt, and difficulty getting out of bed.  Patient reports that he experiences anxiety constantly but denies any new stressors at this time.  A PHQ-9 screen was performed with the patient scoring a 15.  A GAD-7 screen was also performed with the patient scoring a 14.  A [place] Suicide Severity Rating Scale was utilized during the encounter with the patient being considered moderate risk.  Patient denies feeling like a danger to himself and was able to contract for safety following the conclusion of the encounter.  Patient is calm, cooperative, and fully engaged in conversation during the encounter.  Patient reports that he is feeling nonchalant.  Patient denies suicidal or homicidal ideations.  He further denies auditory or visual hallucinations and does not appear to be responding to internal/external stimuli.  Patient endorses fair sleep and receives on average 6 to 8 hours of sleep each night.  Patient states that he normally eats 3 meals per day but has been running low on food.  Patient denies alcohol consumption and illicit drug use.  Patient endorses tobacco use and states that he smokes anywhere between 2 cigarettes to a pack per day.  Visit Diagnosis:    ICD-10-CM   1. Insomnia due to other mental disorder  F51.05 QUEtiapine (SEROQUEL) 300 MG tablet   F99  2. Bipolar affective disorder, current episode mixed, current episode severity unspecified ([place])  F31.60 escitalopram (LEXAPRO) 20 MG tablet    QUEtiapine (SEROQUEL) 300 MG tablet    3. Generalized anxiety disorder  F41.1 escitalopram (LEXAPRO) 20 MG tablet      Past Psychiatric History:   Insomnia Generalized anxiety disorder Bipolar disorder  Past Medical History:  Past Medical History:  Diagnosis Date   Anxiety    Bipolar disorder ([place])    Depression    Diabetes mellitus without complication ([place])    Diabetic polyneuropathy associated with type 1 diabetes mellitus ([place])    Hydrocephalus ([place])    Kidney stones    Seizures ([place])    Truncal ataxia     Past Surgical History:  Procedure Laterality Date   BRAIN SURGERY     hydrocephalus   KIDNEY STONE SURGERY      Family Psychiatric History:  Mother - Severe depression   Patient is unsure of any other family psychiatric history  Family History:  Family History  Problem Relation Age of Onset   Breast cancer Mother    Other Father        unsure of medical history   Diabetes Paternal Aunt     Social History:  Social History   Socioeconomic History   Marital status: Single    Spouse name: Not on file   Number of children: 0   Years of education: 9th grade   Highest education level: Not on file  Occupational History   Occupation: seeking disability  Tobacco Use   Smoking status: Every Day    Packs/day: 1.00    Years: 20.00    Pack years: 20.00    Types: Cigarettes   Smokeless tobacco: Never  Vaping Use   Vaping Use: Never used  Substance and Sexual Activity   Alcohol use: Yes    Comment: occassional    Drug use: Yes    Frequency: 7.0 times per week    Types: Marijuana    Comment: smoke daily   Sexual activity: Not Currently  Other Topics Concern   Not on file  Social History Narrative   Lives with a roommate.   Right-handed.   Drinks 2 liter of soda each day.   Social Determinants of Health   Financial Resource Strain: High Risk   Difficulty of Paying Living Expenses: Very hard  Food Insecurity: Landscape architect Present   Worried About Charity fundraiser in the Last Year: Sometimes true   Arboriculturist in the Last Year: Sometimes true  Transportation Needs: Unmet  Transportation Needs   Lack of Transportation (Medical): Yes   Lack of Transportation (Non-Medical): Yes  Physical Activity: Not on file  Stress: Not on file  Social Connections: Not on file    Allergies:  Allergies  Allergen Reactions   Mushroom Extract Complex Hives, Itching and Nausea And Vomiting   Penicillins Anaphylaxis, Hives and Swelling    Has patient had a PCN reaction causing immediate rash, facial/tongue/throat swelling, SOB or lightheadedness with hypotension: Yes Has patient had a PCN reaction causing severe rash involving mucus membranes or skin necrosis: No Has patient had a PCN reaction that required hospitalization: Yes Has patient had a PCN reaction occurring within the last 10 years: Yes If all of the above answers are "NO", then may proceed with Cephalosporin use. Tolerated ceftriaxone 03/24/20   Sulfa Antibiotics Anaphylaxis and Hives   Clindamycin/Lincomycin Hives    Metabolic Disorder Labs: Lab Results  Component  Value Date   HGBA1C 8.4 (H) 03/21/2021   MPG 194 03/21/2021   MPG 168.55 09/15/2020   No results found for: PROLACTIN Lab Results  Component Value Date   CHOL 214 (H) 03/05/2020   TRIG 53 09/03/2020   HDL 45 03/05/2020   CHOLHDL 4.8 03/05/2020   VLDL 16 03/05/2020   LDLCALC 153 (H) 03/05/2020   LDLCALC 141 (H) 09/11/2017   Lab Results  Component Value Date   TSH 14.416 (H) 09/02/2020   TSH 18.433 (H) 03/05/2020    Therapeutic Level Labs: No results found for: LITHIUM Lab Results  Component Value Date   VALPROATE <10 (L) 03/22/2021   VALPROATE <10 (L) 09/02/2020   No components found for:  CBMZ  Current Medications: Current Outpatient Medications  Medication Sig Dispense Refill   acetaminophen (TYLENOL) 325 MG tablet Take 2 tablets (650 mg total) by mouth every 6 (six) hours as needed for mild pain, fever or headache.     Blood Glucose Monitoring Suppl (ACCU-CHEK GUIDE ME) w/Device KIT USE AS DIRECTED 1 kit 0   escitalopram  (LEXAPRO) 20 MG tablet Take 1 tablet (20 mg total) by mouth daily. 30 tablet 1   gabapentin (NEURONTIN) 100 MG capsule Take 1 capsule (100 mg total) by mouth 2 (two) times daily. 60 capsule 0   glucose blood (ACCU-CHEK GUIDE) test strip USE AS DIRECTED 100 each 12   insulin NPH-regular Human (NOVOLIN 70/30) (70-30) 100 UNIT/ML injection INJECT 3 TIMES A DAY JUST BEFORE EACH MEAL 45-35-25 40 mL 0   Insulin Syringe-Needle U-100 30G X 1/2" 0.5 ML MISC USE AS DIRECTED 100 each 0   Lancets MISC USE AS DIRECTED 100 each 0   levETIRAcetam (KEPPRA) 500 MG tablet Take 1 tablet (500 mg total) by mouth 2 (two) times daily. 60 tablet 0   levothyroxine (SYNTHROID) 50 MCG tablet Take 1 tablet (50 mcg total) by mouth daily at 6 (six) AM. 30 tablet 0   ondansetron (ZOFRAN ODT) 8 MG disintegrating tablet Take 1 tablet (8 mg total) by mouth every 8 (eight) hours as needed for nausea or vomiting. 20 tablet 0   pyridOXINE (B-6) 50 MG tablet Take 1 tablet (50 mg total) by mouth daily. 30 tablet 0   QUEtiapine (SEROQUEL) 100 MG tablet Take 1 tablet (100 mg total) by mouth daily. 30 tablet 0   QUEtiapine (SEROQUEL) 300 MG tablet Take 1 tablet (300 mg total) by mouth at bedtime. 30 tablet 1   No current facility-administered medications for this visit.     Musculoskeletal: Strength & Muscle Tone: Unable to assess due to telemedicine visit [place]: Unable to assess due to telemedicine visit Patient leans: Unable to assess due to telemedicine visit  Psychiatric Specialty Exam: Review of Systems  Psychiatric/Behavioral:  Positive for sleep disturbance. Negative for decreased concentration, dysphoric [name], hallucinations, self-injury and suicidal ideas. The patient is nervous/anxious. The patient is not hyperactive.    There were no vitals taken for this visit.There is no height or weight on file to calculate BMI.  General Appearance: Unable to assess due to telemedicine visit  Eye Contact:  Unable to assess  due to telemedicine visit  Speech:  Clear and Coherent and Normal Rate  Volume:  Normal  [name]:  Anxious, Depressed, and Irritable  Affect:  Congruent and Depressed  Thought Process:  Coherent, Goal Directed, and Descriptions of Associations: Intact  Orientation:  Full (Time, Place, and Person)  Thought Content: WDL   Suicidal Thoughts:  No  Homicidal Thoughts:  No  Memory:  Immediate;   Fair Recent;   Fair Remote;   Poor  Judgement:  Good  Insight:  Good  Psychomotor Activity:  Normal  Concentration:  Concentration: Good and Attention Span: Good  Recall:  [employer] of Knowledge: Fair  Language: Good  Akathisia:  NA  Handed:  Right  AIMS (if indicated): not done  Assets:  Communication Skills Desire for Improvement Housing Social Support  ADL's:  Intact  Cognition: Impaired,  Mild  Sleep:  Fair   Screenings: AIMS    Flowsheet Row Admission (Discharged) from 03/13/2020 in [place] [facility] Admission (Discharged) from 09/10/2017 in [place] Total Score 0 0      AUDIT    [place] Admission (Discharged) from 03/13/2020 in [place] [facility] Admission (Discharged) from 09/10/2017 in [place] Admission (Discharged) from 08/22/2017 in [place]  Alcohol Use Disorder Identification Test Final Score (AUDIT) 0 0 1      GAD-7    Flowsheet Row Video Visit from 04/19/2021 in [place] from 03/24/2021 in Primary Care at [facility] Video Visit from 02/22/2021 in [facility] Office Visit from 12/05/2020 in Primary Care at [facility]  Total GAD-7 Score _0 0      PHQ2-9    Flowsheet Row Video Visit from 04/19/2021 in [place] from 03/24/2021 in Primary Care at [facility] Video Visit from 02/22/2021 in [facility] Patient Outreach Telephone from 01/09/2021 in [place] from 12/29/2020 in [place] at [facility]  PHQ-2 Total Score _1 PHQ-9 Total Score _2 Flowsheet Row Video Visit from 04/19/2021 in [facility] ED to Hosp-Admission (Discharged) from 03/21/2021 in [facility] Video Visit from 02/22/2021 in [place] Moderate Risk No Risk High Risk        Assessment and Plan:   Mark [name] is a 35 year old male with a past psychiatric history significant for insomnia, generalized anxiety disorder, and bipolar disorder who presents to [facility] via virtual telephone visit for follow-up and medication management.  Patient endorses sleep disturbances coupled with irritability and feeling manic at times.  Patient also endorses depressive symptoms and anxiety.  Patient was recommended increasing his dosage of Seroquel from 200 mg to 300 mg at bedtime for the management of his insomnia and manic episodes.  Patient was also recommended increasing his dosage of escitalopram from 10 mg to 20 mg daily for the management of his depressive symptoms and anxiety.  Patient was agreeable to recommendations.  Patient's medications to be e-prescribed to pharmacy of choice.  1. Insomnia due to other mental disorder  - QUEtiapine (SEROQUEL) 300 MG tablet; Take 1 tablet (300 mg total) by mouth at bedtime.  Dispense: 30 tablet; Refill: 1  2. Bipolar affective disorder, current episode mixed, current episode severity unspecified (HCC)  - escitalopram (LEXAPRO) 20 MG tablet; Take 1 tablet (20 mg total) by mouth daily.  Dispense: 30 tablet; Refill: 1 - QUEtiapine (SEROQUEL) 300 MG tablet; Take 1 tablet (300 mg total) by mouth at bedtime.  Dispense: 30 tablet; Refill: 1  3. Generalized anxiety  disorder  - escitalopram (LEXAPRO) 20  MG tablet; Take 1 tablet (20 mg total) by mouth daily.  Dispense: 30 tablet; Refill: 1   Patient to follow up in 2 months Provider spent a total of 20 minutes with the patient/reviewing patient's chart  Mark Mood, PA 04/19/2021, 9:16 PM

## 2021-04-20 ENCOUNTER — Other Ambulatory Visit: Payer: Self-pay

## 2021-04-26 ENCOUNTER — Other Ambulatory Visit: Payer: Self-pay | Admitting: *Deleted

## 2021-04-26 NOTE — Patient Outreach (Signed)
[facility]) Care Management  04/26/2021  Mark [name] [birth year] [id number]   Telephone Assessment    Subjective:  Unsuccessful outreach call to patient, no answer able to leave a HIPAA compliant voicemail message for return call.     Plan If no return call on today, will plan outreach call in the next week.    [name], RN, BSN  [facility] Care Management,Care Management Coordinator  [phone]- Mobile [phone]- Toll Free Main Office

## 2021-04-27 ENCOUNTER — Other Ambulatory Visit: Payer: Self-pay

## 2021-05-02 ENCOUNTER — Other Ambulatory Visit: Payer: Self-pay | Admitting: *Deleted

## 2021-05-02 NOTE — Patient Outreach (Signed)
[facility]) Care Management  05/02/2021  ANTE ARREDONDO [birth year] [id number]   Telephone Assessment    #2 Outreach attempt  Subjective:   Unsuccessful outreach call to patient, no answer able to leave a HIPAA compliant voicemail message for return call.  Incoming return call from patient, he discussed having phone issues and awaiting a new phone with number, currently using roommate phone number.  Patient states that he is doing okay, reports taking insulin and having supply on hand. He states checking blood sugar once in the last week it was a little high unable to recall reading.  Patient requesting contact number of [place] [facility] to follow up on his Disability.  Patient continues report of difficulty with mobility due to neuropathy. He also reports not taking all medication yet reports having prescriptions as picked up from [facility] and wellness declines reviewing medications at this time. Suggested pharmacy referral to assist with medication organization to increase compliance  he declines at this time.        Goals      Find Help in My Community     Timeframe:  Long-Range Goal Priority:  Medium Start Date:    04/11/21                         Expected End Date:06/20/21                       Follow Up Date 05/18/21    - follow-up on any referrals for help I am given - think ahead to make sure my need does not become an emergency    Why is this important?   Knowing how and where to find help for yourself or family in your neighborhood and community is an important skill.  You will want to take some steps to learn how.    Notes: Assisted with call to DSS regarding being new to medicaid, identified case worker, patient provided contact number with readback. Patient to receive phone call from representative  regarding food stamps recertification .       Make and Keep All Appointments     Timeframe:  Long-Range Goal Priority:   High Start Date:   11/23/20                          Expected End Date:   07/21/21             Follow Up Date: 05/18/21    - call to cancel if needed - keep a calendar with appointment dates  Reinforced keeping all appointments    Why is this important?   Part of staying healthy is seeing the doctor for follow-up care.  If you forget your appointments, there are some things y. you can do to stay on track.   Barriers: Health Behaviors Knowledge Other, forgetful  Notes:  7/12 Agreeable to rescheduling missed EEG and MRI appointments calls to [facility] neurology office to return call to patient to reschedule. Assisted with transportation for 7/15 appt.with endocrinology, patient teachback pick up time . Missed appointment to be rescheduled, offered to assist with appointment rescheduled ,placed call to reschedule behavior health appointment he requested to reschedule one at a time. Behavorial health, EEG and CT scan appointments       Manage My Medicine     Timeframe:  Long-Range Goal Priority:  Medium Start Date:  11/23/20                          Expected End Date:   05/20/21                  Follow Up Date: 05/18/21   - call for medicine refill 2 or 3 days before it runs out - keep a list of all the medicines I take; vitamins and herbals too  Encouraged taking medications as prescribed.    Why is this important?   These steps will help you keep on track with your medicines.  .Barriers: Financial  Notes: 05/02/21 Urged importance regarding taking medication as prescribed, identifies taking insulin, seroquel, gabapentin. He declines reviewing other medications at this time He states being agreeable to pharmacy referral regarding medication organization and compliance due to memory. He request to wait until he gets new phone , will alert me with new number.  Patient reports having medication and taking as bottle label reads       Monitor and Manage My Blood Sugar-Diabetes Type 1      Timeframe:  Long-Range Goal Priority:  High Start Date:   12/13/20                          Expected End Date:   07/21/21                  Follow Up Date 05/18/21    - check blood sugar if I feel it is too high or too low reinforced    Why is this important?   Checking your blood sugar at home helps to keep it from getting very high or very low.  Writing the results in a diary or log helps the doctor know how to care for you.  Your blood sugar log should have the time, the date and the results.  Also, write down the amount of insulin or other medicine you take.  Other information like what you ate, exercise done and how you were feeling will also be helpful..   Barriers: Financial    Notes: 7/12, urged  monitoring readings as recommended to help identify blood sugar readings  and control. Reviewed signs/symptoms of hyperglycemia .  Patient now has cbg meter, supplies to monitor readings, verbalizes understanding how to use , encouraged use       Set My Target A1C     Timeframe:  Short-Term Goal Priority:  Medium Start Date:   12/13/20                          Expected End Date:  07/21/21                  Follow Up Date : 05/18/21   - set target A1C    Why is this important?   Your target A1C is decided together by you and your doctor.  It is based on several things like your age and other health issues.    Notes:  05/02/11 Discussed goal of A1c 7 range , reviewed patient recent reading . Encouraged to discuss at endocrinology visit .  Urged taking insulin as prescribed to help with managing glucose and avoiding low blood sugar symptoms, denies recent low blood sugar readings or symptoms            Plan Patient agreeable to plan and follow up  telephone visit in the next month.    [name], RN, BSN  [facility] Care Management,Care Management Coordinator  [phone]- Mobile [phone]- Toll Free Main Office

## 2021-05-03 ENCOUNTER — Telehealth: Payer: Self-pay | Admitting: Neurology

## 2021-05-03 NOTE — Telephone Encounter (Signed)
EEG order sent to [facility] d/t lack of staffing for procedure at our office.  Patient's MRI was sent to [facility] in May. They reached out to patient twice and left messages both times. I received a voicemail from unknown person yesterday (unknown if on DPR) stating that patient would now like to schedule MRI. I called patient and LVM advising that if he would like to schedule, he will need to call [facility]. I provided their phone number.

## 2021-05-05 ENCOUNTER — Ambulatory Visit: Payer: 59 | Admitting: Endocrinology

## 2021-05-05 ENCOUNTER — Other Ambulatory Visit: Payer: Self-pay | Admitting: *Deleted

## 2021-05-05 DIAGNOSIS — IMO0002 Reserved for concepts with insufficient information to code with codable children: Secondary | ICD-10-CM

## 2021-05-05 DIAGNOSIS — E1051 Type 1 diabetes mellitus with diabetic peripheral angiopathy without gangrene: Secondary | ICD-10-CM

## 2021-05-05 DIAGNOSIS — F3170 Bipolar disorder, currently in remission, most recent episode unspecified: Secondary | ICD-10-CM

## 2021-05-05 NOTE — Patient Outreach (Addendum)
[facility]) Care Management  05/05/2021  Mark [name] 12-Dec-1985 [id number]   Care Coordination   Incoming call from patient stating that he needs to cancel his scheduled appointment with endocrinology on today, he discussed his balance is off on today and he does not feel like going.  Patient also states he has a cut on this left foot between toes great toe states occurred while getting out of shower a 2 days ago. He denies drainage or bleeding  at site reports some redness. Encouraged regarding follow up concerns with site, if increase in redness, swelling  signs of infection and importance in relation to history of diabetes. Stressed with patient keeping site clean, dry, wearing clean socks which he states that he does. Urged regarding keeping medical appointment on today,he again declines.  Assisted patient with making call to MD office regarding rescheduling appointment on today.  Assisted with calling [facility] [employer] regarding cancelling ride to today.   Patient discussed that he assumed his food stamps would be active on today, as informed by social services in his visit in the last 2 weeks to recertify, assisted with call to social services, representative spoke with patient and states because he was late recertifying it could take up to 30, she will have representative call him.  Patient roommate [name] states they currently have food supply, recent trip to grocery store, unsure how long will last, patient asking if any support available for food supply      Plan Will plan follow up with patient in the next 2 weeks.  Encouraged calling sooner for new concerns and seeking medical attention . Will place  Community Care coordination referral regarding food resources/need.  Email message to [name] transportation to cancel ride appointment for today.    [name], RN, BSN  [name] Care Management,Care Management Coordinator  [phone]- Mobile [phone]  Toll Free Main Office

## 2021-05-08 ENCOUNTER — Telehealth: Payer: Self-pay | Admitting: *Deleted

## 2021-05-08 NOTE — Telephone Encounter (Signed)
   Telephone encounter was:  Successful.  05/08/2021 Name: Mark [name] MRN: [id number] DOB: Mar 16, 1986  Mark [name] is a 35 y.o. year old male who is a primary care patient of Rema Fendt, NP . The community resource team was consulted for assistance with Food Insecurity  Care guide performed the following interventions: Patient provided with information about care guide support team and interviewed to confirm resource needs.  Follow Up Plan:  No further follow up planned at this time. The patient has been provided with needed resources.patient referred for 30 days diabetic diet MOms meals also mailed food Banks lists   Alois Cliche -[facility] Guide , Embedded Care Coordination [facility], Care Management  [phone] [address] [place] , [place] [address] Email : [email or web address]

## 2021-05-16 ENCOUNTER — Other Ambulatory Visit: Payer: Self-pay | Admitting: *Deleted

## 2021-05-16 NOTE — Patient Outreach (Signed)
[facility]) Care Management  05/16/2021  Mark [name] 08-Mar-1986 [id number]   Care Coordination  Telephone Assessment    Telephone Assessment  Referral received: 11/22/20 Referral source: [facility] Referral reason : 2 or more ED visits in January    Admissions in last 6 months  1  ED visits in last 6 months: 1   PMHX: Diabetes type 1,insulin dependent, Right leg DVT, seizure disorder,hydoephalus ,  anxiety, bipolar disorder,  diabetic polyneuropathy.   Subjective; Returned incoming call from patient , regarding transportation to upcoming rescheduled medical appointment on 7/27 for EEG. Patient verifies that he called [facility] [employer] to arrange ride to appointment on 7/27 to be picked up at 1:50.  Patient and roommate discussed patient decrease in hospitalizations /ED  over the last 6 months. He reports being compliant with taking insulin, but not monitoring blood sugars having supplies on hand to monitor.  Patient reports receiving meals arranged through [facility] for short term until food stamps reestablished reported to be August 15. Patient unable to recall follow up call from social services regarding verification that they have all the information they need.     Goals Addressed             This Visit's Progress    Find Help in My Community   On track    Timeframe:  Long-Range Goal Priority:  Medium Start Date:    04/11/21                         Expected End Date:06/20/21                       Follow Up Date 05/31/21   - follow-up on any referrals for help I am given - think ahead to make sure my need does not become an emergency    Why is this important?   Knowing how and where to find help for yourself or family in your neighborhood and community is an important skill.  You will want to take some steps to learn how.    Notes: 7/26 , verified receiving  shortterm delivery meals  by [facility] until food stamps reestablished, assist with call to  DSS regarding food stamps, and additional information needed. Verified patient has contact number for DSS to follow up if no return call in the next 24 to 48 hours.  Assisted with call to DSS regarding being new to medicaid, identified case worker, patient provided contact number with readback. Patient to receive phone call from representative  regarding food stamps recertification .      Make and Keep All Appointments   On track    Timeframe:  Long-Range Goal Priority:  High Start Date:   11/23/20                          Expected End Date:   07/21/21             Follow Up Date: 05/31/21    - call to cancel if needed - keep a calendar with appointment dates  Reinforced keeping all appointments    Why is this important?   Part of staying healthy is seeing the doctor for follow-up care.  If you forget your appointments, there are some things y. you can do to stay on track.   Barriers: Health Behaviors Knowledge Other, forgetful  Notes:  7/26 congratulated on rescheduling appointments  with EEG and MRI and arranging ride, reinforced his transportation available with benefit of Medicaid for medical appointments and verified having contact number . Reviewed upcoming appointment at [facility] for ID cards and reminded to call to arrange transportation with [facility] since nonmedical appointment.  7/12 Agreeable to rescheduling missed EEG and MRI appointments calls to [facility] neurology office to return call to patient to reschedule. Assisted with transportation for 7/15 appt.with endocrinology, patient teachback pick up time . Missed appointment to be rescheduled, offered to assist with appointment rescheduled ,placed call to reschedule behavior health appointment he requested to reschedule one at a time. Behavorial health, EEG and CT scan appointments      Manage My Medicine   On track    Timeframe:  Long-Range Goal Priority:  Medium Start Date:   11/23/20                          Expected End Date:    05/20/21                  Follow Up Date: 05/31/21   - call for medicine refill 2 or 3 days before it runs out - keep a list of all the medicines I take; vitamins and herbals too  Encouraged taking medications as prescribed.    Why is this important?   These steps will help you keep on track with your medicines.  .Barriers: Financial  Notes: 7/26 Verifies having insulin on hand and taking , he is admits not taking all medication , reinforced importance. Discussed cost savings on medication since having medicaid benefit.  05/02/21 Urged importance regarding taking medication as prescribed, identifies taking insulin, seroquel, gabapentin. He declines reviewing other medications at this time He states being agreeable to pharmacy referral regarding medication organization and compliance due to memory. He request to wait until he gets new phone , will alert me with new number.  Patient reports having medication and taking as bottle label reads      Monitor and Manage My Blood Sugar-Diabetes Type 1   Not on track    Timeframe:  Long-Range Goal Priority:  High Start Date:   12/13/20                          Expected End Date:   07/21/21                  Follow Up Date 05/31/21   - check blood sugar if I feel it is too high or too low reinforced    Why is this important?   Checking your blood sugar at home helps to keep it from getting very high or very low.  Writing the results in a diary or log helps the doctor know how to care for you.  Your blood sugar log should have the time, the date and the results.  Also, write down the amount of insulin or other medicine you take.  Other information like what you ate, exercise done and how you were feeling will also be helpful..   Barriers: Financial    Notes: 05/16/21 No working on at this time, urged importance.  7/12, urged  monitoring readings as recommended to help identify blood sugar readings  and control. Reviewed signs/symptoms of  hyperglycemia .  Patient now has cbg meter, supplies to monitor readings, verbalizes understanding how to use , encouraged use      Set  My Target A1C   On track    Timeframe:  Short-Term Goal Priority:  Medium Start Date:   12/13/20                          Expected End Date:  07/21/21                  Follow Up Date : 05/31/21   - set target A1C    Why is this important?   Your target A1C is decided together by you and your doctor.  It is based on several things like your age and other health issues.    Notes:  7/26 discussed upcoming follow up appointment with endocrinology , and managing glucose control, limiting sweet soda, taking medications as prescribed, keeping a check on blood sugars at home.  05/02/11 Discussed goal of A1c 7 range , reviewed patient recent reading . Encouraged to discuss at endocrinology visit .  Urged taking insulin as prescribed to help with managing glucose and avoiding low blood sugar symptoms, denies recent low blood sugar readings or symptoms          Plan: Will plan follow up call in the next 2 weeks, reinforced contacting care coordinator sooner if new concerns arise.    [name], RN, BSN  [facility] Care Management,Care Management Coordinator  [phone]- Mobile [phone]- Toll Free Main Office

## 2021-05-17 ENCOUNTER — Ambulatory Visit (INDEPENDENT_AMBULATORY_CARE_PROVIDER_SITE_OTHER): Payer: 59 | Admitting: Neurology

## 2021-05-17 ENCOUNTER — Other Ambulatory Visit: Payer: Self-pay

## 2021-05-17 DIAGNOSIS — G919 Hydrocephalus, unspecified: Secondary | ICD-10-CM

## 2021-05-17 DIAGNOSIS — G40909 Epilepsy, unspecified, not intractable, without status epilepticus: Secondary | ICD-10-CM

## 2021-05-17 DIAGNOSIS — F3160 Bipolar disorder, current episode mixed, unspecified: Secondary | ICD-10-CM

## 2021-05-17 DIAGNOSIS — E1042 Type 1 diabetes mellitus with diabetic polyneuropathy: Secondary | ICD-10-CM

## 2021-05-17 NOTE — Progress Notes (Signed)
EEG completed at [facility]. Full report to follow.

## 2021-05-17 NOTE — Procedures (Signed)
   HISTORY: 35 year old male with history of hydrocephalus, status post VP shunt, history of seizure.   TECHNIQUE:  This is a routine 16 channel EEG recording with one channel devoted to a limited EKG recording.  It was performed during wakefulness, drowsiness and asleep.  Hyperventilation and photic stimulation were performed as activating procedures.  There are minimum muscle and movement artifact noted.   Upon maximum arousal, posterior dominant waking rhythm consistent of rhythmic alpha range activity, with frequency of 9 Hz. Activities are symmetric over the bilateral posterior derivations and attenuated with eye opening.   Hyperventilation produced mild/moderate buildup with higher amplitude and the slower activities noted.   Photic stimulation did not alter the tracing.   During EEG recording, patient developed drowsiness and no deeper stage of sleep was achieved.   During EEG recording, there was no epileptiform discharge noted.   EKG demonstrate sinus rhythm, with heart rate of 68 bpm   CONCLUSION: This is a  normal awake EEG.  There is no electrodiagnostic evidence of epileptiform discharge.    Levert Feinstein, M.D. Ph.D.  [facility] [address] [place], [place] [address] Phone: [phone] Fax:      [phone]

## 2021-05-18 ENCOUNTER — Ambulatory Visit: Payer: 59 | Admitting: *Deleted

## 2021-05-23 ENCOUNTER — Other Ambulatory Visit: Payer: 59

## 2021-05-23 ENCOUNTER — Other Ambulatory Visit: Payer: Self-pay | Admitting: *Deleted

## 2021-05-23 NOTE — Patient Outreach (Signed)
[facility]) Care Management  05/23/2021  Mark [name] [birth year] [id number]   Care Coordination    Subjective: Incoming call from patient, [name] reports that his food stamps has been reestablished and he has been able to access.  He discussed also having transportation arranged to for appointment at [facility] on 8/3 to get ID card.  He reports having insulin on hand, taking it and denies any new concerns.     Plan Will plan follow up call in the next week as scheduled patient agreeable.    [name], RN, BSN  [facility] Care Management,Care Management Coordinator  [phone]- Mobile [phone]- Toll Free Main Office

## 2021-05-24 ENCOUNTER — Other Ambulatory Visit: Payer: Self-pay | Admitting: *Deleted

## 2021-05-24 NOTE — Patient Outreach (Signed)
[facility]) Care Management  05/24/2021  Mark [name] 11/24/1985 [id number]   Care Coordination   Incoming call from patient, stating that his ride did not show up for transportation to [facility] for ID cards. Discussed with patient that he made be aware on prior telephone call that he had scheduled appointment for transportation. Explained to patient that I previously scheduled appointment for [facility] online  in last month as requested for he and his roommate as they requested as they do not have internet access.  Mark [name]  states that the he is very upset and must have forgotten, states angrily "just make me another appointment."  Placed call to [facility] spoke with representative that states DMV at [facility] where patient original appointment was made does accept walks but they have to arrive early in the morning, office hours Saturday at [facility] 7a- 12n, and Monday -Friday 7a to 5pm.  Placed return call to patient to explain this option he yells I just want you to make another appointment.  Explained to patient that I will reschedule appointment and return call to him with date/time.   Scheduled online appointment with DMV for ID card, 9/15 at 12n.   Plan  Will communicate [facility] appointment with patient at next call, and reinforce transportation option with [facility] [place] and assist as needed.    [name], RN, BSN  [name] Care Management,Care Management Coordinator  [phone]- Mobile [phone]- Toll Free Main Office

## 2021-05-25 ENCOUNTER — Other Ambulatory Visit: Payer: Self-pay | Admitting: *Deleted

## 2021-05-25 NOTE — Patient Outreach (Signed)
[facility]) Care Management  05/25/2021  Mark [name] 05-10-1986 [id number]   Care Coordination   Subjective; Incoming call from patient, requesting to know the date of his rescheduling appointment at [facility] for ID cards, stating he was expecting a call on yesterday.  Explained to patient appointment scheduled for September 15, earliest appointment available.   Patient went on to discuss concern regarding his roommate home is now in foreclosure , he is requesting assisting with this matter, and requested that it be expedited. Patient also discussed that refrigerator that he keeps his medication in is no longer working and asking for assistance, he reports he was given.  Outreach call to [facility], Discussed patient current concerns regarding Disability , she reports patient has planned call from Disability [employer] process on 8/13,requested I continue to remind patient. She discussed call from patient also regarding roommate home foreclosure notice she discussed with patient on yesterday no financial resources available through servant center.     Plan Discussed patient case with [name] , Clinical Director at [facility] care management to collaborate regarding patient needs.  Returned call to patient to update regarding plan to follow up as information on resource follow up as received. Spoke with patient roommate Mark [name],  as patient shares phone ,patient not available . Mark [name] began to elaborate that he has made outreach calls to an attorney, regarding foreclosure notice he received on 8/1. He also discussed making prior outreach calls to 211 for resource support. He discussed main interest now is getting a refrigerator in home, reports only having the small refrigerator that now does not work. He reports that he will communicate information to patient.   [name], RN, BSN  [facility] Care Management,Care Management Coordinator  [phone]-  Mobile [phone]- Toll Free Main Office

## 2021-05-31 ENCOUNTER — Other Ambulatory Visit: Payer: 59

## 2021-05-31 ENCOUNTER — Other Ambulatory Visit: Payer: Self-pay | Admitting: *Deleted

## 2021-05-31 NOTE — Patient Outreach (Signed)
[facility]) Care Management  05/31/2021  Mark [name] Mar 16, 1986 [id number]   Telephone Assessment    Subjective: Scheduled outreach call to patient, person answering phone identified as Mark [name] patient roommate and patient has given prior verbal agreement that we may speak with Mark [name] on his behalf.  Mark [name] states that he and Mark [name] share the same phone. He is currently not at home. Mark [name] shares that patient received a call on today and Mark [name] has been for SSI and he should receive a check in the next 2 weeks.  He reports patient has insulin on hand for use.  Mark [name] discussed that he is handling the situation regarding receiving summon of foreclosure of his home that he recently received, making contact with his real estate attorney and other contacts he is aware of .   Encouraged Mark [name] to have Mark [name] return call to me when available. He discussed plan to hopefully be able to get patient a phone soon.  Unable to fully address care plan goals, with attempt follow up with patient.   Plan Will await return call from patient, if no return call will schedule call in the next week.    [name], RN, BSN  [facility] Care Management,Care Management Coordinator  [phone]- Mobile [phone]- Toll Free Main Office

## 2021-06-01 ENCOUNTER — Other Ambulatory Visit: Payer: 59

## 2021-06-01 ENCOUNTER — Other Ambulatory Visit: Payer: Self-pay | Admitting: *Deleted

## 2021-06-01 ENCOUNTER — Ambulatory Visit: Payer: 59 | Admitting: Neurology

## 2021-06-01 NOTE — Patient Outreach (Signed)
[facility]) Care Management  06/01/2021  Mark [name] 1985-11-02 [id number]   Telephone Assessment   Received incoming voicemail message from patient requesting call to assist with arranging transportation to medical appointment on 8/16. Returned call to patient no answer able to leave a HIPAA compliant voicemail message for return call.    Plan Will await return call , if no response will attempt return call next business day for follow up.   [name], RN, BSN  [facility] Care Management,Care Management Coordinator  [phone]- Mobile [phone]- Toll Free Main Office

## 2021-06-02 ENCOUNTER — Other Ambulatory Visit: Payer: Self-pay

## 2021-06-02 ENCOUNTER — Other Ambulatory Visit: Payer: Self-pay | Admitting: *Deleted

## 2021-06-02 NOTE — Patient Outreach (Signed)
[facility]) Care Management  06/02/2021  Mark [name] 06-25-1986 [id number]   Telephone Assessment Care Coordination  Subjective Return call patient to follow up call from yesterday regarding needing assistance with scheduling transportation for medical appointment .  Patient requesting assistance with arranging transportation, explained that since he has medicaid we will make contact with that agency to arrange transportation.  Placed call to Medicaid transportation line with patient on the line,representative explains that patient will need an medicaid transportation assessment completed prior to scheduling a ride. She explained that a representative would call him to arrange assessment. Patient expressed need for his roommate to accompany him on rides to medical appointment due to difficulty recalling information and for support, representative states that they will need a letter with MD practice letterhead to state patient will need escort on trips faxed to [phone]. Patient upset with this information explaining that he needs this support. Reassured patient that I will make contact with provider office to forward this information.    Patient reports not sleeping well, he reports not having seroquel , he requested [facility], with patient on the phone was informed cost of $0. He states that his friend would assist with picking up medication.  Patient states that now that he has medicaid it would work best [facility] .   Goals      Find Help in My Community     Timeframe:  Long-Range Goal Priority:  Medium Start Date:    04/11/21                         Expected End Date:06/20/21                       Follow Up Date 06/14/21   - follow-up on any referrals for help I am given - think ahead to make sure my  need does not become an emergency    Why is this important?   Knowing how and where to find help for yourself or family in your neighborhood and community is an important skill.  You will want to take some steps to learn how.    Notes 06/02/21 Patient thankful he has been approved disability benefits. 8/2 Celebrated patient having food stamp resources reestablished.   7/26 , verified receiving  shortterm delivery meals  by [facility] until food stamps reestablished, assist with call to DSS regarding food stamps, and additional information needed. Verified patient has contact number for DSS to follow up if no return call in the next 24 to 48 hours.  Assisted with call to DSS regarding being new to medicaid, identified case worker, patient provided contact number with readback. Patient to receive phone call from representative  regarding food stamps recertification .      Make and Keep All Appointments     Timeframe:  Long-Range Goal Priority:  High Start Date:   11/23/20                          Expected End Date:   07/21/21             Follow Up Date: 06/14/21    - call to cancel if needed - keep a calendar with appointment dates  Reinforced keeping all appointments  Why is this important?   Part of staying healthy is seeing the doctor for follow-up care.  If you forget your appointments, there are some things y. you can do to stay on track.   Barriers: Health Behaviors Knowledge Other, forgetful  Notes:  8/12 Patient calls for assistance with transportation to appt with endocrinology, assisted to with call to DSS medicaid transportation, identified patient will need medicaid transportation assessment prior to arranging a ride. Assisted with arranging ride to next appointment with [facility] transportation , explained to patient need to use medicaid transportation benefit.  8/2 patient verifies having transportation for appt at [facility] for ID card scheduled. Congratulated on arranging transportation  and attending EEG appt.  7/26 congratulated on rescheduling appointments with EEG and MRI and arranging ride, reinforced his transportation available with benefit of Medicaid for medical appointments and verified having contact number . Reviewed upcoming appointment at [facility] for ID cards and reminded to call to arrange transportation with [facility] since nonmedical appointment.  7/12 Agreeable to rescheduling missed EEG and MRI appointments calls to [facility] neurology office to return call to patient to reschedule. Assisted with transportation for 7/15 appt.with endocrinology, patient teachback pick up time . Missed appointment to be rescheduled, offered to assist with appointment rescheduled ,placed call to reschedule behavior health appointment he requested to reschedule one at a time. Behavorial health, EEG and CT scan appointments      Manage My Medicine     Timeframe:  Long-Range Goal Priority:  Medium Start Date:   11/23/20                          Expected End Date:   05/20/21                  Follow Up Date: 06/14/21   - call for medicine refill 2 or 3 days before it runs out - keep a list of all the medicines I take; vitamins and herbals too  Encouraged taking medications as prescribed.    Why is this important?   These steps will help you keep on track with your medicines.  .Barriers: Financial  Notes: 8/12 No sleeping , does not have seroquel and other oral medications, [facility]. Patient [facility] for [facility]. Reinforced importance of taking medications as prescribed.  7/26 Verifies having insulin on hand and taking , he is admits not taking all medication , reinforced importance. Discussed cost savings on medication since having medicaid benefit.  05/02/21 Urged importance regarding taking medication as prescribed, identifies taking insulin, seroquel, gabapentin. He declines reviewing other medications  at this time He states being agreeable to pharmacy referral regarding medication organization and compliance due to memory. He request to wait until he gets new phone , will alert me with new number.  Patient reports having medication and taking as bottle label reads      Monitor and Manage My Blood Sugar-Diabetes Type 1     Timeframe:  Long-Range Goal Priority:  High Start Date:   12/13/20                          Expected End Date:   07/21/21                  Follow Up Date 06/14/21   - check blood sugar if I feel it  is too high or too low reinforced    Why is this important?   Checking your blood sugar at home helps to keep it from getting very high or very low.  Writing the results in a diary or log helps the doctor know how to care for you.  Your blood sugar log should have the time, the date and the results.  Also, write down the amount of insulin or other medicine you take.  Other information like what you ate, exercise done and how you were feeling will also be helpful..   Barriers: Financial    Notes: 8/12 continues to not monitor bloods sugar readings, admits having supplies on hand  05/16/21 No working on at this time, urged importance.Denies having hypoglycemia symptoms   7/12, urged  monitoring readings as recommended to help identify blood sugar readings  and control. Reviewed signs/symptoms of hyperglycemia .  Patient now has cbg meter, supplies to monitor readings, verbalizes understanding how to use , encouraged use      Set My Target A1C     Timeframe:  Short-Term Goal Priority:  Medium Start Date:   12/13/20                          Expected End Date:  07/21/21                  Follow Up Date : 06/14/21   - set target A1C    Why is this important?   Your target A1C is decided together by you and your doctor.  It is based on several things like your age and other health issues.    Notes:  06/02/21 has endocrinology visit upcoming encouraged discussion 7/26  discussed upcoming follow up appointment with endocrinology , and managing glucose control, limiting sweet soda, taking medications as prescribed, keeping a check on blood sugars at home.  05/02/11 Discussed goal of A1c 7 range , reviewed patient recent reading . Encouraged to discuss at endocrinology visit .  Urged taking insulin as prescribed to help with managing glucose and avoiding low blood sugar symptoms, denies recent low blood sugar readings or symptoms          Care Coordination  Patient return call to me to report that he has completed call to Medicaid for transportation assessment,reports that he was able to include his roommate [name] on list to transport with him, indicating MD note not needed. He voiced not being able to schedule ride for upcoming appt on 8/16, reminded of other transportation ride arranged.   Patient also informed me that [facility].   Plan Patient agreeable to plan and follow up visit in the next 2 weeks.    Mark Garibaldi, RN, BSN  [facility] Care Management,Care Management Coordinator  [phone]- Mobile [phone]- Toll Free Main Office

## 2021-06-06 ENCOUNTER — Ambulatory Visit: Payer: 59 | Admitting: Endocrinology

## 2021-06-09 ENCOUNTER — Ambulatory Visit: Payer: 59 | Admitting: Endocrinology

## 2021-06-14 ENCOUNTER — Other Ambulatory Visit: Payer: Self-pay | Admitting: *Deleted

## 2021-06-14 NOTE — Patient Outreach (Signed)
[facility]) Care Management  06/14/2021   EFOSA TREICHLER [birth year] [id number]  Telephone Assessment   Subjective:  Successful outreach call to patient , he reports doing okay today except air conditioning not working they are awaiting on a repairman.  Patient discussed receiving his initial disability check and being pleased .  He reports that he continues to take his insulin and he has made contact [facility] other  [facility]ications, states not taking seizure medication as he doesn't feel he needs them.    Current Medications:  Current Outpatient Medications  Medication Sig Dispense Refill   acetaminophen (TYLENOL) 325 MG tablet Take 2 tablets (650 mg total) by mouth every 6 (six) hours as needed for mild pain, fever or headache.     Blood Glucose Monitoring Suppl (ACCU-CHEK GUIDE ME) w/Device KIT USE AS DIRECTED 1 kit 0   escitalopram (LEXAPRO) 20 MG tablet Take 1 tablet (20 mg total) by mouth daily. 30 tablet 1   gabapentin (NEURONTIN) 100 MG capsule Take 1 capsule (100 mg total) by mouth 2 (two) times daily. 60 capsule 0   glucose blood (ACCU-CHEK GUIDE) test strip USE AS DIRECTED 100 each 12   insulin NPH-regular Human (NOVOLIN 70/30) (70-30) 100 UNIT/ML injection INJECT 3 TIMES A DAY JUST BEFORE EACH MEAL 45-35-25 40 mL 0   Insulin Syringe-Needle U-100 30G X 1/2" 0.5 ML MISC USE AS DIRECTED 100 each 0   Lancets MISC USE AS DIRECTED 100 each 0   levETIRAcetam (KEPPRA) 500 MG tablet Take 1 tablet (500 mg total) by mouth 2 (two) times daily. 60 tablet 0   levothyroxine (SYNTHROID) 50 MCG tablet Take 1 tablet (50 mcg total) by mouth daily at 6 (six) AM. 30 tablet 0   ondansetron (ZOFRAN ODT) 8 MG disintegrating tablet Take 1 tablet (8 mg total) by mouth every 8 (eight) hours as needed for nausea or vomiting. 20 tablet 0   pyridOXINE (B-6) 50 MG tablet Take 1 tablet (50 mg total) by mouth daily. 30 tablet 0   QUEtiapine (SEROQUEL) 100 MG tablet  Take 1 tablet (100 mg total) by mouth daily. 30 tablet 0   QUEtiapine (SEROQUEL) 300 MG tablet Take 1 tablet (300 mg total) by mouth at bedtime. 30 tablet 1   No current facility-administered medications for this visit.    Goals Addressed             This Visit's Progress    Enhance My Mental Skills   On track    Timeframe:  Long-Range Goal Priority:  Medium Start Date:      06/02/21                       Expected End Date:   07/21/21                    Follow Up Date 07/03/21 Barriers: Psychological Impairment   - do word search or crossword puzzles daily  Attend all medical, behavioral health counseling appointmens.  .   Why is this important?   As we age, or sometimes because we have an illness, it feels like our memory and ability to figure things out is not very good.  There are things you can do to keep your memory and your thinking as strong as possible.    Notes: 06/14/21 Again reinforced taking medications adequate rest Stressed importance of taking medications as prescribed     COMPLETED: Find Help  in My Community   On track    Timeframe:  Long-Range Goal Priority:  Medium Start Date:    04/11/21                         Expected End Date:06/20/21                       Follow Up Date 06/14/21   - follow-up on any referrals for help I am given - think ahead to make sure my need does not become an emergency    Why is this important?   Knowing how and where to find help for yourself or family in your neighborhood and community is an important skill.  You will want to take some steps to learn how.    Notes 8/24 Verified receiving initial disability check  06/02/21 Patient thankful he has been approved disability benefits. 8/2 Celebrated patient having food stamp resources reestablished.   7/26 , verified receiving  shortterm delivery meals  by [facility] until food stamps reestablished, assist with call to DSS regarding food stamps, and additional information needed.  Verified patient has contact number for DSS to follow up if no return call in the next 24 to 48 hours.  Assisted with call to DSS regarding being new to medicaid, identified case worker, patient provided contact number with readback. Patient to receive phone call from representative  regarding food stamps recertification .      Make and Keep All Appointments   On track    Timeframe:  Long-Range Goal Priority:  High Start Date:   11/23/20                          Expected End Date:   07/21/21             Follow Up Date: 07/03/21    - call to cancel if needed - keep a calendar with appointment dates  Reinforced keeping all appointments    Why is this important?   Part of staying healthy is seeing the doctor for follow-up care.  If you forget your appointments, there are some things y. you can do to stay on track.   Barriers: Health Behaviors Knowledge Other, forgetful  Notes:  8/24 Patient rescheduled endocrinology appt. Reviewed upcoming appt on 9/15 at [facility] for ID card, agreeable to follow up call to verify arranging transportation with [facility] and reminded of cost for this non medical appt. Verified contact number to arrange ride.  8/12 Patient calls for assistance with transportation to appt with endocrinology, assisted to with call to [place] transportation, identified patient will need medicaid transportation assessment prior to arranging a ride. Assisted with arranging ride to next appointment with [facility] transportation , explained to patient need to use medicaid transportation benefit.  8/2 patient verifies having transportation for appt at [facility] for ID card scheduled. Congratulated on arranging transportation and attending EEG appt.  7/26 congratulated on rescheduling appointments with EEG and MRI and arranging ride, reinforced his transportation available with benefit of Medicaid for medical appointments and verified having contact number . Reviewed upcoming appointment at [facility] for  ID cards and reminded to call to arrange transportation with [facility] since nonmedical appointment.  7/12 Agreeable to rescheduling missed EEG and MRI appointments calls to [facility] neurology office to return call to patient to reschedule. Assisted with transportation for 7/15 appt.with endocrinology, patient teachback pick up  time . Missed appointment to be rescheduled, offered to assist with appointment rescheduled ,placed call to reschedule behavior health appointment he requested to reschedule one at a time. Behavorial health, EEG and CT scan appointments      Manage My Medicine   Not on track    Timeframe:  Long-Range Goal Priority:  Medium Start Date:   11/23/20                          Expected End Date:   08/20/21                 Follow Up Date: 07/03/21   - call for medicine refill 2 or 3 days before it runs out - keep a list of all the medicines I take; vitamins and herbals too  Encouraged taking medications as prescribed.    Why is this important?   These steps will help you keep on track with your medicines.  .Barriers: Financial  Notes: 8/24 reports improvement in sleeping having seroquel refill , taking insulin reinforced benefits of taking medications as prescribed. Good understanding of importance of insulin daily, encouraged taking as prescribed.  8/12 No sleeping , does not have seroquel and other oral medications, [facility] Patient [facility] for [facility]. Reinforced importance of taking medications as prescribed.  7/26 Verifies having insulin on hand and taking , he is admits not taking all medication , reinforced importance. Discussed cost savings on medication since having medicaid benefit.  05/02/21 Urged importance regarding taking medication as prescribed, identifies taking insulin, seroquel, gabapentin. He declines reviewing other medications at this time He states being agreeable to  pharmacy referral regarding medication organization and compliance due to memory. He request to wait until he gets new phone , will alert me with new number.  Patient reports having medication and taking as bottle label reads      Monitor and Manage My Blood Sugar-Diabetes Type 1   Not on track    Timeframe:  Long-Range Goal Priority:  High Start Date:   12/13/20                          Expected End Date:   07/21/21                  Follow Up Date 07/03/21  - check blood sugar if I feel it is too high or too low reinforced    Why is this important?   Checking your blood sugar at home helps to keep it from getting very high or very low.  Writing the results in a diary or log helps the doctor know how to care for you.  Your blood sugar log should have the time, the date and the results.  Also, write down the amount of insulin or other medicine you take.  Other information like what you ate, exercise done and how you were feeling will also be helpful..   Barriers: Financial    Notes: 8/24, Not monitoring blood sugars, declines assistance with education on use of monitor reports understanding.  8/12 continues to not monitor bloods sugar readings, admits having supplies on hand  05/16/21 No working on at this time, urged importance.Denies having hypoglycemia symptoms   7/12, urged  monitoring readings as recommended to help identify blood sugar readings  and control. Reviewed signs/symptoms of hyperglycemia .  Patient now has cbg meter, supplies to monitor readings, verbalizes understanding how to use , encouraged use      Set My Target A1C   Not on track    Timeframe:  Short-Term Goal Priority:  Medium Start Date:   12/13/20                          Expected End Date:  07/21/21                  Follow Up Date : 06/14/21   - set target A1C    Why is this important?   Your target A1C is decided together by you and your doctor.  It is based on several things like your age and other health  issues.    Notes:  06/02/21 has endocrinology visit upcoming encouraged discussion 7/26 discussed upcoming follow up appointment with endocrinology , and managing glucose control, limiting sweet soda, taking medications as prescribed, keeping a check on blood sugars at home.  05/02/11 Discussed goal of A1c 7 range , reviewed patient recent reading . Encouraged to discuss at endocrinology visit .  Urged taking insulin as prescribed to help with managing glucose and avoiding low blood sugar symptoms, denies recent low blood sugar readings or symptoms           Assessment:  Patient will benefit from continued complex care management of chronic conditions.   Plan:  Patient agreeable to plan and follow up  in the next 3 weeks.    Joylene Draft, RN, BSN  [place] Management Coordinator  [phone]- Mobile [phone]- Toll Free Main Office

## 2021-06-21 ENCOUNTER — Other Ambulatory Visit: Payer: Self-pay

## 2021-06-21 ENCOUNTER — Telehealth (HOSPITAL_COMMUNITY): Payer: 59 | Admitting: Physician Assistant

## 2021-07-03 ENCOUNTER — Telehealth (HOSPITAL_COMMUNITY): Payer: Self-pay | Admitting: Physician Assistant

## 2021-07-03 ENCOUNTER — Other Ambulatory Visit: Payer: Self-pay | Admitting: *Deleted

## 2021-07-03 NOTE — Patient Outreach (Addendum)
[facility]) Care Management  07/03/2021  Mark [name] April 17, 1986 [id number]   Telephone Assessment    Subjective Outreach call to patient, his roommate Brandon Melnick answers the phone, he states patient does not want to talk at this time, patient can be heard in the background agreeing.    Received return call from patient from his new phone and number.  Patient discussed having some difficulty with walking he discussed having a cut to his left foot after hitting it against broken piece of bathroom equipment . He states left foot area red , swollen irritated. Discussed importance of follow up due to concern with diabetes,risk of infection and slow healing. Patient is agreeable with assistance.   He discussed recent mental state of being angry, increased anxiety , denies self harm thoughts,over the last several weeks.  He reports not taking medications, not having on hand at home. He is agreeable to phone call to [facility] health for appointment. Assisted with call to [facility] health, noted patient missed August 31 appointment, soonest reschedule appointment is October 18 . Representative states she will send message to provider to return call to patient on today , patient is agreeable.  Discussed with patient importance of taking medications, his roommate Armanda Heritage states he [facility], for cost and pick up patient medication , patient gives verbal agreement to this acknowledging $0 cost of seroquel at last refill.    Goals Addressed             This Visit's Progress    Enhance My Mental Skills   On track    Timeframe:  Long-Range Goal Priority:  Medium Start Date:      06/02/21                       Expected End Date:   07/21/21                    Follow Up Date 06/2021 Barriers: Psychological Impairment   - do word search or crossword puzzles daily  Attend all medical, behavioral health counseling appointmens.  .   Why is  this important?   As we age, or sometimes because we have an illness, it feels like our memory and ability to figure things out is not very good.  There are things you can do to keep your memory and your thinking as strong as possible.    Notes:  07/03/21 Agreeable to call to behavorial health regarding anxiety for follow up appointment to speak with provider. Reminded of emergency mental resources , 911, behavioral health crisis line.  06/14/21 Again reinforced taking medications adequate rest Stressed importance of taking medications as prescribed     Make and Keep All Appointments   On track    Timeframe:  Long-Range Goal Priority:  High Start Date:   11/23/20                          Expected End Date:   07/21/21             Follow Up Date: 07/11/21    - call to cancel if needed - keep a calendar with appointment dates  Reinforced keeping all appointments    Why is this important?   Part of staying healthy is seeing the doctor for follow-up care.  If you forget your appointments, there are some things y. you can  do to stay on track.   Barriers: Health Behaviors Knowledge Other, forgetful  Notes:  9/12 - patient agreeable to scheduling appointment with PCP regarding left foot wound, call to PCP office representative states she will contact patient, to schedule appointment and will arrange transportation if needed, discussed patient appointment 9/15 at [facility] Patient agreeable to transportation arrangement with [facility] for transportation, roommate [name] to travel with him ride pick up time explained with roommate on phone.  8/24 Patient rescheduled endocrinology appt. Reviewed upcoming appt on 9/15 at [facility] for ID card, agreeable to follow up call to verify arranging transportation with [facility] and reminded of cost for this non medical appt. Verified contact number to arrange ride.  8/12 Patient calls for assistance with transportation to appt with endocrinology, assisted to with call to  [employer] medicaid transportation, identified patient will need medicaid transportation assessment prior to arranging a ride. Assisted with arranging ride to next appointment with [facility] transportation , explained to patient need to use medicaid transportation benefit.  8/2 patient verifies having transportation for appt at [facility] for ID card scheduled. Congratulated on arranging transportation and attending EEG appt.  7/26 congratulated on rescheduling appointments with EEG and MRI and arranging ride, reinforced his transportation available with benefit of Medicaid for medical appointments and verified having contact number . Reviewed upcoming appointment at [facility] for ID cards and reminded to call to arrange transportation with [facility] since nonmedical appointment.  7/12 Agreeable to rescheduling missed EEG and MRI appointments calls to [facility] neurology office to return call to patient to reschedule. Assisted with transportation for 7/15 appt.with endocrinology, patient teachback pick up time . Missed appointment to be rescheduled, offered to assist with appointment rescheduled ,placed call to reschedule behavior health appointment he requested to reschedule one at a time. Behavorial health, EEG and CT scan appointments      Manage My Medicine   Not on track    Timeframe:  Long-Range Goal Priority:  Medium Start Date:   11/23/20                          Expected End Date:   08/20/21                 Follow Up Date: 06/2021   - call for medicine refill 2 or 3 days before it runs out - keep a list of all the medicines I take; vitamins and herbals too  Encouraged taking medications as prescribed.    Why is this important?   These steps will help you keep on track with your medicines.  .Barriers: Financial , memory  Notes: 07/03/21- Patient agreeable to assistance with [facility], agrees that roommate my pick up prescriptions.  8/24 reports improvement in sleeping  having seroquel refill , taking insulin reinforced benefits of taking medications as prescribed. Good understanding of importance of insulin daily, encouraged taking as prescribed. He verifies continuing to take insulin as prescribed.  8/12 No sleeping , does not have seroquel and other oral medications, [facility]. Patient [facility] for [facility]. Reinforced importance of taking medications as prescribed.  7/26 Verifies having insulin on hand and taking , he is admits not taking all medication , reinforced importance. Discussed cost savings on medication since having medicaid benefit.  05/02/21 Urged importance regarding taking medication as prescribed, identifies taking insulin, seroquel, gabapentin.  He declines reviewing other medications at this time He states being agreeable to pharmacy referral regarding medication organization and compliance due to memory. He request to wait until he gets new phone , will alert me with new number.  Patient reports having medication and taking as bottle label reads          Plan Patient agreeable to plan and follow up call in the next day.   [name], RN, BSN  [facility] Care Management,Care Management Coordinator  [phone]- Mobile [phone]- Toll Free Main Office

## 2021-07-03 NOTE — Telephone Encounter (Signed)
Caller [facility] caseworker, Stefani Dama with pt on the phone to schedule urgent appt or return call from provider. Patient and caller report increased bout of anxiety and depression.  Next appointment with provider is 08/08/21 virtual due to pt inability to secure transportation. Patient provided current phone [phone].

## 2021-07-04 ENCOUNTER — Other Ambulatory Visit: Payer: Self-pay | Admitting: *Deleted

## 2021-07-04 NOTE — Patient Outreach (Signed)
[facility]) Care Management  07/04/2021  KRISHAV MAMONE 11-Dec-1985 [id number]   Care Coordination    Follow up call to patient to discuss whether he has been able to pick on refill on medication from pharmacy. Patient states that he is busy right now  just got from shopping get some clothes and unable to talk  states he will return call to me .   [facility] , representative states patient has lexapro ready for pick up , cost $5.91,  also gabapentin ready, they have sent request for provider for refill on seroquel prescription as they had received request from patient on yesterday.   Plan Will await Return call from patient, if no return call will plan outreach in the next 4 business days.    [name], RN, BSN  [facility] Care Management,Care Management Coordinator  [phone]- Mobile [phone]- Toll Free Main Office

## 2021-07-06 ENCOUNTER — Other Ambulatory Visit: Payer: Self-pay | Admitting: *Deleted

## 2021-07-06 NOTE — Patient Outreach (Signed)
[facility]) Care Management  07/06/2021  Mark [name] Jun 10, 1986 [id number]    Care Coordination    Subjective 1130 Incoming call from patient and his roommate Mark [name],  Patient complains that there was a mix up in the arrangement for [facility] ride pick up time. Patient with scheduled appointment at [facility] for ID card at 12noon.  Patient roommate states that he called [facility] and reports that they had scheduled ride pick for 1015-1045 for 12 noon appointment . Reviewed with roommate that was the time  range noted from previous call to transportation , he recalls time being 1130 for pick up.  He discussed that he did not receive a call regarding ride being at his home, he reports calling transportation and being told they did call,but the number called was not current contact number for patient.  Expressed to patient apology for misunderstanding regarding transportation pick up time. Patient making accusations of not getting his appointments correctly.   Discussed with patient/roommate if they would be able to arrange other means to keep appointment on today, he states possibly can use a cab.  Placed call to [facility] regarding patient ride arrangements representative states ride pick up time 1030-11am, ride arrived at patient home 1034 waited 5 minutes placed call to number [phone] to notify  patient of arrival no answer. Discussed with representative that was not patient current phone number.   Patient declined further conversation with me at this time.    Plan Will plan follow up call in the next 3 weeks.  Will make contact with patient medicaid to determine if he has medicaid case worker to assist with case. Placed call to DSS, spoke with representative that reports patient has full medicaid, His case worker is [employer] [phone]. Placed call to [name] able to leave a HIPAA compliant voice mail message. On return call will  discuss Case worker involvement to assist with appointments and transportation or  other medicaid  resources to help patient with managing day to day..  Discussed patient case and situation with [name], Chiropodist.   [name], RN, BSN  [facility] Care Management,Care Management Coordinator  [phone]- Mobile [phone]- Toll Free Main Office

## 2021-07-07 NOTE — Telephone Encounter (Signed)
Provider was contacted by Mark [name] regarding phone call made by patient's case worker. Appointment was made for the patient. Provider to contact patient.

## 2021-07-11 ENCOUNTER — Other Ambulatory Visit: Payer: Self-pay | Admitting: *Deleted

## 2021-07-11 ENCOUNTER — Ambulatory Visit: Payer: Self-pay | Admitting: *Deleted

## 2021-07-11 NOTE — Patient Outreach (Signed)
[facility]) Care Management  07/11/2021  Mark [name] 01-24-1986 [id number]  Care Coordination    #2 Call attempt  Return call to [facility] patient assigned Case worker as benefit of Medicaid,to discuss patient community available services as benefit of medicaid, no answer able to leave a message for return call.   Plan If no return call , will plan outreach call in the next week.     [name], RN, BSN  [facility] Care Management,Care Management Coordinator  [phone]- Mobile [phone]- Toll Free Main Office

## 2021-07-18 ENCOUNTER — Other Ambulatory Visit: Payer: Self-pay | Admitting: *Deleted

## 2021-07-18 NOTE — Patient Outreach (Signed)
[facility]) Care Management  07/18/2021  RAJVIR ERNSTER Dec 04, 1985 [id number]   Care Coordination      Call attempt  Outreach call to DSS Medicaid department, spoke with representative, discussed prior outreach call attempt  to patient assigned case worker to collaborate regarding making contact with patient regarding additional community benefits of medicaid services. Per representative she will make case worker aware of return call.     Plan Scheduled outreach call to patient in the next week.    [name], RN, BSN  [facility] Care Management,Care Management Coordinator  [phone]- Mobile [phone]- Toll Free Main Office

## 2021-07-19 ENCOUNTER — Other Ambulatory Visit: Payer: Self-pay | Admitting: *Deleted

## 2021-07-19 NOTE — Patient Outreach (Signed)
[facility]) Care Management  07/19/2021  Mark [name] May 22, 1986   [facility]) Care Management  [facility] Care Manager  07/19/2021   Mark [name] 1986/01/15 [id number]  Subjective:  Incoming call from patient he offered apology for prior behavior at recent calls. He discussed that he has not been sleeping well, reports not having medications, seroquel , lexapro.  He states that he takes insulin 3 times daily at 35 units,instead of how prescribed, at 45, 35-25 , stating it is easier for him to keep up with. He reports not having money to purchase until 10/1 to get prescriptions filled. He is agreeable to phone call to follow up cost of refills and transportation services as well as medical appointments follow up. Discussed with patient attempted contact with his medicaid worker to discuss additional medicaid benefits that may benefit him requested her contact number for follow up.  Objective:   Encounter Medications:  Outpatient Encounter Medications as of 07/19/2021  Medication Sig   acetaminophen (TYLENOL) 325 MG tablet Take 2 tablets (650 mg total) by mouth every 6 (six) hours as needed for mild pain, fever or headache.   Blood Glucose Monitoring Suppl (ACCU-CHEK GUIDE ME) w/Device KIT USE AS DIRECTED   escitalopram (LEXAPRO) 20 MG tablet Take 1 tablet (20 mg total) by mouth daily.   gabapentin (NEURONTIN) 100 MG capsule Take 1 capsule (100 mg total) by mouth 2 (two) times daily.   glucose blood (ACCU-CHEK GUIDE) test strip USE AS DIRECTED   insulin NPH-regular Human (NOVOLIN 70/30) (70-30) 100 UNIT/ML injection INJECT 3 TIMES A DAY JUST BEFORE EACH MEAL 45-35-25   Insulin Syringe-Needle U-100 30G X 1/2" 0.5 ML MISC USE AS DIRECTED   Lancets MISC USE AS DIRECTED   levETIRAcetam (KEPPRA) 500 MG tablet Take 1 tablet (500 mg total) by mouth 2 (two) times daily.   levothyroxine (SYNTHROID) 50 MCG tablet Take 1 tablet (50 mcg total) by mouth daily at 6  (six) AM.   ondansetron (ZOFRAN ODT) 8 MG disintegrating tablet Take 1 tablet (8 mg total) by mouth every 8 (eight) hours as needed for nausea or vomiting.   pyridOXINE (B-6) 50 MG tablet Take 1 tablet (50 mg total) by mouth daily.   QUEtiapine (SEROQUEL) 100 MG tablet Take 1 tablet (100 mg total) by mouth daily.   QUEtiapine (SEROQUEL) 300 MG tablet Take 1 tablet (300 mg total) by mouth at bedtime.   No facility-administered encounter medications on file as of 07/19/2021.    Functional Status:  In your present state of health, do you have any difficulty performing the following activities: 03/22/2021  Hearing? N  Vision? N  Difficulty concentrating or making decisions? N  Walking or climbing stairs? Y  Dressing or bathing? N  Doing errands, shopping? N  Some recent data might be hidden    Fall/Depression Screening: Fall Risk  03/24/2021 12/29/2020 12/05/2020  Falls in the past year? _0 Number falls in past yr: _1 Injury with Fall? 0 1 1  Risk for fall due to : Impaired balance/gait Impaired balance/gait;Impaired mobility Impaired balance/gait;Impaired mobility;History of fall(s)  Follow up Falls evaluation completed Falls evaluation completed Falls evaluation completed   [facility] 2/9 Scores 04/19/2021 03/24/2021 02/22/2021 01/09/2021 12/29/2020 12/05/2020 12/01/2020  PHQ - 2 Score _2 PHQ- 9 Score _3 Assessment: Patient will benefit from ongoing care management to assist with  care coordination and disease management needs.       Goals Addressed             This Visit's Progress    Enhance My Mental Skills   On track    Timeframe:  Long-Range Goal Priority:  Medium Start Date:      06/02/21                       Expected End Date:   10/20/21                    Follow Up Date 08/03/21 Barriers: Psychological Impairment   - do word search or crossword puzzles daily  Attend all medical, behavioral health counseling appointmens.  .   Why is this  important?   As we age, or sometimes because we have an illness, it feels like our memory and ability to figure things out is not very good.  There are things you can do to keep your memory and your thinking as strong as possible.    Notes:  9/28- agreeable to follow up on timing of next behavorial health appointment, date/time reviewed with teachback, virtual appointment 10/18. 07/03/21 Agreeable to call to behavorial health regarding anxiety for follow up appointment to speak with provider. Reminded of emergency mental resources , 911, behavioral health crisis line.  06/14/21 Again reinforced taking medications adequate rest Stressed importance of taking medications as prescribed     Make and Keep All Appointments   On track    Timeframe:  Long-Range Goal Priority:  High Start Date:   11/23/20                          Expected End Date:   10/20/21            Follow Up Date: 08/03/21    - call to cancel if needed - keep a calendar with appointment dates  Reinforced keeping all appointments    Why is this important?   Part of staying healthy is seeing the doctor for follow-up care.  If you forget your appointments, there are some things y. you can do to stay on track.   Barriers: Health Behaviors Knowledge Other, forgetful  Notes: 9/28 Assisted with rescheduling,appointment with endocrinology as patient request, assisted with call to [facility] health next appointment on 10/18 , virtual visit. Provided contact number to schedule PCP visit .   9/12 - patient agreeable to scheduling appointment with PCP regarding left foot wound, call to PCP office representative states she will contact patient, to schedule appointment and will arrange transportation if needed, discussed patient appointment 9/15 at [facility]. Patient agreeable to transportation arrangement with [employer] [facility] for transportation, roommate Mark [name] to travel with him ride pick up time explained with roommate on phone.  8/24 Patient  rescheduled endocrinology appt. Reviewed upcoming appt on 9/15 at [facility] for ID card, agreeable to follow up call to verify arranging transportation with [employer] [facility] and reminded of cost for this non medical appt. Verified contact number to arrange ride.  8/12 Patient calls for assistance with transportation to appt with endocrinology, assisted to with call to [place] transportation, identified patient will need medicaid transportation assessment prior to arranging a ride. Assisted with arranging ride to next appointment with [facility] transportation , explained to patient need to use medicaid transportation benefit.  8/2 patient verifies having transportation for appt at [facility] for ID card scheduled. Congratulated  on arranging transportation and attending EEG appt.  7/26 congratulated on rescheduling appointments with EEG and MRI and arranging ride, reinforced his transportation available with benefit of Medicaid for medical appointments and verified having contact number . Reviewed upcoming appointment at [facility] for ID cards and reminded to call to arrange transportation with [employer] [facility] since nonmedical appointment.  7/12 Agreeable to rescheduling missed EEG and MRI appointments calls to [facility] neurology office to return call to patient to reschedule. Assisted with transportation for 7/15 appt.with endocrinology, patient [employer] pick up time . Missed appointment to be rescheduled, offered to assist with appointment rescheduled ,placed call to reschedule behavior health appointment he requested to reschedule one at a time. Behavorial health, EEG and CT scan appointments      Manage My Medicine   On track    Timeframe:  Long-Range Goal Priority:  Medium Start Date:   11/23/20                          Expected End Date:   08/20/21                 Follow Up Date: 08/03/21   - call for medicine refill 2 or 3 days before it runs out - keep a list of all the medicines I take; vitamins and herbals too  Encouraged  taking medications as prescribed.    Why is this important?   These steps will help you keep on track with your medicines.  .Barriers: Financial , memory  Notes: 07/19/21: Congratulated for consistency in taking insulin , stressed taking as prescribed. Patient request assistance with follow up on refills on prescriptions, call to [facility] prescriptions for medications , roommate to pick up on 10/1, total cost $11.50.  07/03/21- Patient agreeable to assistance with making call to [facility] regarding refilling prescriptions, agrees that roommate my pick up prescriptions.  8/24 reports improvement in sleeping having seroquel refill , taking insulin reinforced benefits of taking medications as prescribed. Good understanding of importance of insulin daily, encouraged taking as prescribed. He verifies continuing to take insulin as prescribed.  8/12 No sleeping , does not have seroquel and other oral medications, assisted with call to [facility] to determine cost. Patient requesting [facility] home for easier access. Reinforced importance of taking medications as prescribed.  7/26 Verifies having insulin on hand and taking , he is admits not taking all medication , reinforced importance. Discussed cost savings on medication since having medicaid benefit.  05/02/21 Urged importance regarding taking medication as prescribed, identifies taking insulin, seroquel, gabapentin. He declines reviewing other medications at this time He states being agreeable to pharmacy referral regarding medication organization and compliance due to memory. He request to wait until he gets new phone , will alert me with new number.  Patient reports having medication and taking as bottle label reads      Monitor and Manage My Blood Sugar-Diabetes Type 1   Not on track    Timeframe:  Long-Range Goal Priority:  High Start Date:   12/13/20                          Expected End Date:   06/14/21               Follow Up Date  08/03/21 - check blood sugar if I feel it is too high or too low reinforced    Why  is this important?   Checking your blood sugar at home helps to keep it from getting very high or very low.  Writing the results in a diary or log helps the doctor know how to care for you.  Your blood sugar log should have the time, the date and the results.  Also, write down the amount of insulin or other medicine you take.  Other information like what you ate, exercise done and how you were feeling will also be helpful..   Barriers: Financial    Notes: 8/24, Not monitoring blood sugars, declines assistance with education on use of monitor reports understanding.  8/12 continues to not monitor bloods sugar readings, admits having supplies on hand  05/16/21 No working on at this time, urged importance.Denies having hypoglycemia symptoms   7/12, urged  monitoring readings as recommended to help identify blood sugar readings  and control. Reviewed signs/symptoms of hyperglycemia .  Patient now has cbg meter, supplies to monitor readings, verbalizes understanding how to use , encouraged use      Set My Target A1C   Not on track    Timeframe:  Short-Term Goal Priority:  Medium Start Date:   12/13/20                          Expected End Date:  07/21/21                  Follow Up Date : 06/14/21   - set target A1C    Why is this important?   Your target A1C is decided together by you and your doctor.  It is based on several things like your age and other health issues.    Notes:  06/02/21 has endocrinology visit upcoming encouraged discussion 7/26 discussed upcoming follow up appointment with endocrinology , and managing glucose control, limiting sweet soda, taking medications as prescribed, keeping a check on blood sugars at home.  05/02/11 Discussed goal of A1c 7 range , reviewed patient recent reading . Encouraged to discuss at endocrinology visit .  Urged taking insulin as prescribed to help with managing  glucose and avoiding low blood sugar symptoms, denies recent low blood sugar readings or symptoms          Plan:  Follow-up: Patient agrees to Care Plan and Follow-up. In the next 2 weeks.    Joylene Draft, RN, BSN  [place] Management Coordinator  [phone]- Mobile [phone]- Toll Free Main Office

## 2021-07-24 ENCOUNTER — Ambulatory Visit: Payer: 59 | Admitting: Endocrinology

## 2021-07-25 ENCOUNTER — Ambulatory Visit: Payer: Self-pay | Admitting: *Deleted

## 2021-08-03 ENCOUNTER — Other Ambulatory Visit: Payer: Self-pay | Admitting: *Deleted

## 2021-08-03 NOTE — Patient Outreach (Signed)
[name]) Care Management  [place]  08/03/2021   Mark [name] Mar 18, 1986 [id number]    Subjective:  Successful outreach call to patient, he reports doing okay.  He discussed continues to have falls sporacic , some days better than other, reports holding on the wall in the home as needed, now states he does not have a walker. Discussed benefit of follow up with provider he agrees.  Patient denies episodes of hypoglycemia,he explains that is why he drinks regular coca cola daily to prevent episodes, attempted on how sweet soda effect blood sugar control. He states makes sure he takes his insulin if he feel sugars are high. He declines monitoring blood sugars at home.   Objective:   Encounter Medications:  Outpatient Encounter Medications as of 08/03/2021  Medication Sig   acetaminophen (TYLENOL) 325 MG tablet Take 2 tablets (650 mg total) by mouth every 6 (six) hours as needed for mild pain, fever or headache.   Blood Glucose Monitoring Suppl (ACCU-CHEK GUIDE ME) w/Device KIT USE AS DIRECTED   escitalopram (LEXAPRO) 20 MG tablet Take 1 tablet (20 mg total) by mouth daily.   gabapentin (NEURONTIN) 100 MG capsule Take 1 capsule (100 mg total) by mouth 2 (two) times daily.   glucose blood (ACCU-CHEK GUIDE) test strip USE AS DIRECTED   insulin NPH-regular Human (NOVOLIN 70/30) (70-30) 100 UNIT/ML injection INJECT 3 TIMES A DAY JUST BEFORE EACH MEAL 45-35-25   Insulin Syringe-Needle U-100 30G X 1/2" 0.5 ML MISC USE AS DIRECTED   Lancets MISC USE AS DIRECTED   levETIRAcetam (KEPPRA) 500 MG tablet Take 1 tablet (500 mg total) by mouth 2 (two) times daily.   levothyroxine (SYNTHROID) 50 MCG tablet Take 1 tablet (50 mcg total) by mouth daily at 6 (six) AM.   ondansetron (ZOFRAN ODT) 8 MG disintegrating tablet Take 1 tablet (8 mg total) by mouth every 8 (eight) hours as needed for nausea or vomiting.   pyridOXINE (B-6) 50 MG tablet Take 1 tablet (50 mg total) by  mouth daily.   QUEtiapine (SEROQUEL) 100 MG tablet Take 1 tablet (100 mg total) by mouth daily.   QUEtiapine (SEROQUEL) 300 MG tablet Take 1 tablet (300 mg total) by mouth at bedtime.   No facility-administered encounter medications on file as of 08/03/2021.    Functional Status:  In your present state of health, do you have any difficulty performing the following activities: 03/22/2021  Hearing? N  Vision? N  Difficulty concentrating or making decisions? N  Walking or climbing stairs? Y  Dressing or bathing? N  Doing errands, shopping? N  Some recent data might be hidden    Fall/Depression Screening: Fall Risk  03/24/2021 12/29/2020 12/05/2020  Falls in the past year? 1 1 1   Number falls in past yr: 1 1 1   Injury with Fall? 0 1 1  Risk for fall due to : Impaired balance/gait Impaired balance/gait;Impaired mobility Impaired balance/gait;Impaired mobility;History of fall(s)  Follow up Falls evaluation completed Falls evaluation completed Falls evaluation completed   [facility] 2/9 Scores 04/19/2021 03/24/2021 02/22/2021 01/09/2021 12/29/2020 12/05/2020 12/01/2020  PHQ - 2 Score 3 3 2 2 2 2 2   PHQ- 9 Score 15 12 13 5 6 6 6     Assessment:  Patient will benefit from ongoing education, care coordination and support of chronic condition       Goals Addressed             This Visit's Progress    Enhance My  Mental Skills   On track    Timeframe:  Long-Range Goal Priority:  Medium Start Date:      06/02/21                       Expected End Date:   10/20/21                    Follow Up Date 08/03/21 Barriers: Psychological Impairment   - do word search or crossword puzzles daily  Attend all medical, behavioral health counseling appointmens.  .   Why is this important?   As we age, or sometimes because we have an illness, it feels like our memory and ability to figure things out is not very good.  There are things you can do to keep your memory and your thinking as strong as possible.     Notes:  10/13 Noted some improvement with sleeping , taking medications . 9/28- agreeable to follow up on timing of next behavorial health appointment, date/time reviewed with teachback, virtual appointment 10/18. 07/03/21 Agreeable to call to behavorial health regarding anxiety for follow up appointment to speak with provider. Reminded of emergency mental resources , 911, behavioral health crisis line.  06/14/21 Again reinforced taking medications adequate rest Stressed importance of taking medications as prescribed     Make and Keep All Appointments   On track    Timeframe:  Long-Range Goal Priority:  High Start Date:   11/23/20                          Expected End Date:   10/20/21            Follow Up Date: 08/30/21    - call to cancel if needed - keep a calendar with appointment dates  Reinforced keeping all appointments    Why is this important?   Part of staying healthy is seeing the doctor for follow-up care.  If you forget your appointments, there are some things y. you can do to stay on track.   Barriers: Health Behaviors Knowledge Other, forgetful  Notes: 08/03/21 Reviewed upcoming appointments,  Behavioral health telephone visit assisting with transportation arrangements, for endocrinology visit on 10/27.  9/28 Assisted with rescheduling,appointment with endocrinology as patient request, assisted with call to [facility] health next appointment on 10/18 , virtual visit. Provided contact number to schedule PCP visit .   9/12 - patient agreeable to scheduling appointment with PCP regarding left foot wound, call to PCP office representative states she will contact patient, to schedule appointment and will arrange transportation if needed, discussed patient appointment 9/15 at [facility]. Patient agreeable to transportation arrangement with [facility] for transportation, roommate [name] to travel with him ride pick up time explained with roommate on phone.  8/24 Patient rescheduled  endocrinology appt. Reviewed upcoming appt on 9/15 at [facility] for ID card, agreeable to follow up call to verify arranging transportation with [facility] and reminded of cost for this non medical appt. Verified contact number to arrange ride.  8/12 Patient calls for assistance with transportation to appt with endocrinology, assisted to with call to [place] transportation, identified patient will need medicaid transportation assessment prior to arranging a ride. Assisted with arranging ride to next appointment with [facility] transportation , explained to patient need to use medicaid transportation benefit.  8/2 patient verifies having transportation for appt at [facility] for ID card scheduled. Congratulated on arranging transportation and attending  EEG appt.  7/26 congratulated on rescheduling appointments with EEG and MRI and arranging ride, reinforced his transportation available with benefit of Medicaid for medical appointments and verified having contact number . Reviewed upcoming appointment at [facility] for ID cards and reminded to call to arrange transportation with [facility] since nonmedical appointment.  7/12 Agreeable to rescheduling missed EEG and MRI appointments calls to [facility] neurology office to return call to patient to reschedule. Assisted with transportation for 7/15 appt.with endocrinology, patient teachback pick up time . Missed appointment to be rescheduled, offered to assist with appointment rescheduled ,placed call to reschedule behavior health appointment he requested to reschedule one at a time. Behavorial health, EEG and CT scan appointments      Manage My Medicine   On track    Timeframe:  Long-Range Goal Priority:  Medium Start Date:   11/23/20                          Expected End Date:   08/20/21                 Follow Up Date: 07/30/21   - call for medicine refill 2 or 3 days before it runs out - keep a list of all the medicines I take; vitamins and herbals too  Encouraged taking  medications as prescribed.    Why is this important?   These steps will help you keep on track with your medicines.  .Barriers: Financial , memory  Notes: 10/13 confirms being able to get prescriptions from [facility], Discussed  proper insulin syringe and needle disposal will include education handout  07/19/21: Congratulated for consistency in taking insulin , stressed taking as prescribed. Patient request assistance with follow up on refills on prescriptions, call to [facility] prescriptions for medications , roommate to pick up on 10/1, total cost $11.50.  07/03/21- Patient agreeable to assistance with making call to [facility] regarding refilling prescriptions, agrees that roommate my pick up prescriptions.  8/24 reports improvement in sleeping having seroquel refill , taking insulin reinforced benefits of taking medications as prescribed. Good understanding of importance of insulin daily, encouraged taking as prescribed. He verifies continuing to take insulin as prescribed.  8/12 No sleeping , does not have seroquel and other oral medications, assisted with call to [facility] to determine cost. Patient requesting [facility] be [facility] his home for easier access. Reinforced importance of taking medications as prescribed.  7/26 Verifies having insulin on hand and taking , he is admits not taking all medication , reinforced importance. Discussed cost savings on medication since having medicaid benefit.  05/02/21 Urged importance regarding taking medication as prescribed, identifies taking insulin, seroquel, gabapentin. He declines reviewing other medications at this time He states being agreeable to pharmacy referral regarding medication organization and compliance due to memory. He request to wait until he gets new phone , will alert me with new number.  Patient reports having medication and taking as bottle label reads      Monitor and Manage My Blood Sugar-Diabetes Type 1   Not on track    Timeframe:  Long-Range  Goal Priority:  High Start Date:   12/13/20                          Expected End Date:   06/14/21               Follow Up Date 08/30/21 - check  blood sugar if I feel it is too high or too low reinforced    Why is this important?   Checking your blood sugar at home helps to keep it from getting very high or very low.  Writing the results in a diary or log helps the doctor know how to care for you.  Your blood sugar log should have the time, the date and the results.  Also, write down the amount of insulin or other medicine you take.  Other information like what you ate, exercise done and how you were feeling will also be helpful..   Barriers: Financial    Notes: 10/13 Not monitoring blood sugars, reinforced importance. 8/24, Not monitoring blood sugars, declines assistance with education on use of monitor reports understanding.  8/12 continues to not monitor bloods sugar readings, admits having supplies on hand  05/16/21 No working on at this time, urged importance.Denies having hypoglycemia symptoms   7/12, urged  monitoring readings as recommended to help identify blood sugar readings  and control. Reviewed signs/symptoms of hyperglycemia .  Patient now has cbg meter, supplies to monitor readings, verbalizes understanding how to use , encouraged use      Set My Target A1C   Not on track    Timeframe:  Short-Term Goal Priority:  Medium Start Date:   12/13/20                          Expected End Date:  10/20/21                  Follow Up Date : 08/30/21   - set target A1C    Why is this important?   Your target A1C is decided together by you and your doctor.  It is based on several things like your age and other health issues.    Notes:  08/03/21 Reinforced discussed during visit with endocrinology, reviewed significance of A1c goal for controlling diabetes. Will send education information  06/02/21 has endocrinology visit upcoming encouraged discussion 7/26 discussed upcoming  follow up appointment with endocrinology , and managing glucose control, limiting sweet soda, taking medications as prescribed, keeping a check on blood sugars at home.  05/02/11 Discussed goal of A1c 7 range , reviewed patient recent reading . Encouraged to discuss at endocrinology visit .  Urged taking insulin as prescribed to help with managing glucose and avoiding low blood sugar symptoms, denies recent low blood sugar readings or symptoms          Plan:  Follow-up: Patient agrees to Care Plan and Follow-up.Plan return call in the next week to assist with coordination of transportation to medical appointment.  Assist Patient call to PCP office to schedule appointment for office visit , no answer at office number to arrange appointment patient request return call for assistance    Joylene Draft, RN, BSN  [place] Management Coordinator  [phone]- Mobile [phone]- [place]

## 2021-08-04 ENCOUNTER — Other Ambulatory Visit: Payer: Self-pay | Admitting: *Deleted

## 2021-08-04 NOTE — Patient Outreach (Signed)
[facility]) Care Management  08/04/2021  Mark [name] [birth year] [id number]   Care Coordination  Late Entry 1030  Return to call to patient this am to follow up on assistance with scheduling PCP appointment. Per patient roommate [name] has made an appointment for 10/17, patient requesting a different day, per roommate he will assist patient with rescheduling.   Plan Follow up call with patient in the next week  as agreed regarding assisting with transportation to appointment on 10/27 with endocrinology,    [name], RN, BSN  [facility] Care Management,Care Management Coordinator  [phone]- Mobile [phone]- Toll Free Main Office

## 2021-08-08 ENCOUNTER — Telehealth (INDEPENDENT_AMBULATORY_CARE_PROVIDER_SITE_OTHER): Payer: 59 | Admitting: Physician Assistant

## 2021-08-08 ENCOUNTER — Other Ambulatory Visit: Payer: Self-pay

## 2021-08-08 DIAGNOSIS — F5105 Insomnia due to other mental disorder: Secondary | ICD-10-CM | POA: Diagnosis not present

## 2021-08-08 DIAGNOSIS — F3160 Bipolar disorder, current episode mixed, unspecified: Secondary | ICD-10-CM | POA: Diagnosis not present

## 2021-08-08 DIAGNOSIS — F411 Generalized anxiety disorder: Secondary | ICD-10-CM

## 2021-08-08 DIAGNOSIS — F99 Mental disorder, not otherwise specified: Secondary | ICD-10-CM

## 2021-08-08 MED ORDER — ESCITALOPRAM OXALATE 20 MG PO TABS: 20.0000 mg | ORAL_TABLET | Freq: Every day | ORAL | 1 refills | Status: DC

## 2021-08-08 MED ORDER — QUETIAPINE FUMARATE 400 MG PO TABS: 400.0000 mg | ORAL_TABLET | Freq: Every day | ORAL | 1 refills | Status: DC

## 2021-08-08 MED ORDER — BUPROPION HCL ER (XL) 150 MG PO TB24: 150.0000 mg | ORAL_TABLET | ORAL | 1 refills | Status: DC

## 2021-08-08 NOTE — Progress Notes (Addendum)
[place] MD/PA/NP OP Progress Note  Virtual Visit via Telephone Note  I connected with Mark [name] on 08/08/21 at  4:00 PM EDT by telephone and verified that I am speaking with the correct person using two identifiers.  Location: Patient: Home Provider: Clinic   I discussed the limitations, risks, security and privacy concerns of performing an evaluation and management service by telephone and the availability of in person appointments. I also discussed with the patient that there may be a patient responsible charge related to this service. The patient expressed understanding and agreed to proceed.  Follow Up Instructions:  I discussed the assessment and treatment plan with the patient. The patient was provided an opportunity to ask questions and all were answered. The patient agreed with the plan and demonstrated an understanding of the instructions.   The patient was advised to call back or seek an in-person evaluation if the symptoms worsen or if the condition fails to improve as anticipated.  I provided 19 minutes of non-face-to-face time during this encounter.  Malachy Mood, PA   08/08/2021 9:44 PM Mark [name]  MRN:  [id number]  Chief Complaint: Follow up and medication management  HPI:   Mark [name] is a 35 year old male with a past psychiatric history significant for insomnia, bipolar disorder, and generalized anxiety disorder who presents to [facility] via virtual telephone visit for follow-up and medication management.  Patient is currently being managed on the following medications:  Seroquel 300 mg at bedtime Lexapro 20 mg daily  Patient reports that the adjustments made to his medications during the last encounter were somewhat helpful.  Patient states that he still has issues with his sleep and reports that he is still sleeping sporadically.  Patient reports that his mood has been "iffy."  He explains that he is  sometimes depressed and aggravated with life.  Some of his stressors include bills and medications.  Patient endorses the following symptoms: agitation/irritability and restlessness.  Patient endorses the following depressive symptoms: Feelings of guilt/worthlessness and lack of motivation.  Patient denies any changes in his mood.  Patient endorses elevated anxiety which he rates a 7 out of 10.  Patient denies any new stressors at this time.  A PHQ-9 screen was performed with the patient scoring a 13.  A GAD-7 screen was also performed with the patient scoring a 15.  Patient is alert and oriented x4, cooperative and fully engaged in conversation during the encounter.  Patient endorses irritable mood.  Patient denies suicidal or homicidal ideation.  He further denies auditory or visual hallucinations and does not appear to be responding to internal/external stimuli.  Patient endorses fair sleep and receives on average 4 to 6 hours of intermittent sleep.  Patient endorses good appetite and eats on average 2-3 times a day.  Patient denies alcohol consumption.  He endorses tobacco use and smokes on average a pack every 2 days.  Patient endorses illicit drug use in the form of marijuana sparingly.  Visit Diagnosis:    ICD-10-CM   1. Insomnia due to other mental disorder  F51.05 QUEtiapine (SEROQUEL) 400 MG tablet   F99     2. Bipolar affective disorder, current episode mixed, current episode severity unspecified ([place])  F31.60 QUEtiapine (SEROQUEL) 400 MG tablet    escitalopram (LEXAPRO) 20 MG tablet    buPROPion (WELLBUTRIN XL) 150 MG 24 hr tablet    3. Generalized anxiety disorder  F41.1 escitalopram (LEXAPRO) 20 MG tablet  Past Psychiatric History:  Insomnia Generalized anxiety disorder Bipolar disorder  Past Medical History:  Past Medical History:  Diagnosis Date   Anxiety    Bipolar disorder ([place])    Depression    Diabetes mellitus without complication ([place])    Diabetic  polyneuropathy associated with type 1 diabetes mellitus ([place])    Hydrocephalus ([place])    Kidney stones    Seizures ([place])    Truncal ataxia     Past Surgical History:  Procedure Laterality Date   BRAIN SURGERY     hydrocephalus   KIDNEY STONE SURGERY      Family Psychiatric History:  Mother - Severe depression   Patient is unsure of any other family psychiatric history  Family History:  Family History  Problem Relation Age of Onset   Breast cancer Mother    Other Father        unsure of medical history   Diabetes Paternal Aunt     Social History:  Social History   Socioeconomic History   Marital status: Single    Spouse name: Not on file   Number of children: 0   Years of education: 9th grade   Highest education level: Not on file  Occupational History   Occupation: seeking disability  Tobacco Use   Smoking status: Every Day    Packs/day: 1.00    Years: 20.00    Pack years: 20.00    Types: Cigarettes   Smokeless tobacco: Never  Vaping Use   Vaping Use: Never used  Substance and Sexual Activity   Alcohol use: Yes    Comment: occassional    Drug use: Yes    Frequency: 7.0 times per week    Types: Marijuana    Comment: smoke daily   Sexual activity: Not Currently  Other Topics Concern   Not on file  Social History Narrative   Lives with a roommate.   Right-handed.   Drinks 2 liter of soda each day.   Social Determinants of Health   Financial Resource Strain: Medium Risk   Difficulty of Paying Living Expenses: Somewhat hard  Food Insecurity: Food Insecurity Present   Worried About Charity fundraiser in the Last Year: Sometimes true   Arboriculturist in the Last Year: Sometimes true  Transportation Needs: Unmet Transportation Needs   Lack of Transportation (Medical): Yes   Lack of Transportation (Non-Medical): Yes  Physical Activity: Not on file  Stress: Not on file  Social Connections: Not on file    Allergies:  Allergies  Allergen Reactions    Mushroom Extract Complex Hives, Itching and Nausea And Vomiting   Penicillins Anaphylaxis, Hives and Swelling    Has patient had a PCN reaction causing immediate rash, facial/tongue/throat swelling, SOB or lightheadedness with hypotension: Yes Has patient had a PCN reaction causing severe rash involving mucus membranes or skin necrosis: No Has patient had a PCN reaction that required hospitalization: Yes Has patient had a PCN reaction occurring within the last 10 years: Yes If all of the above answers are "NO", then may proceed with Cephalosporin use. Tolerated ceftriaxone 03/24/20   Sulfa Antibiotics Anaphylaxis and Hives   Clindamycin/Lincomycin Hives    Metabolic Disorder Labs: Lab Results  Component Value Date   HGBA1C 8.4 (H) 03/21/2021   MPG 194 03/21/2021   MPG 168.55 09/15/2020   No results found for: PROLACTIN Lab Results  Component Value Date   CHOL 214 (H) 03/05/2020   TRIG 53 09/03/2020   HDL 45  03/05/2020   CHOLHDL 4.8 03/05/2020   VLDL 16 03/05/2020   LDLCALC 153 (H) 03/05/2020   LDLCALC 141 (H) 09/11/2017   Lab Results  Component Value Date   TSH 14.416 (H) 09/02/2020   TSH 18.433 (H) 03/05/2020    Therapeutic Level Labs: No results found for: LITHIUM Lab Results  Component Value Date   VALPROATE <10 (L) 03/22/2021   VALPROATE <10 (L) 09/02/2020   No components found for:  CBMZ  Current Medications: Current Outpatient Medications  Medication Sig Dispense Refill   buPROPion (WELLBUTRIN XL) 150 MG 24 hr tablet Take 1 tablet (150 mg total) by mouth every morning. Patient to take 1 tablet (150 mg total) for 6 days then 2 tablets (300 mg total)  daily 30 tablet 1   acetaminophen (TYLENOL) 325 MG tablet Take 2 tablets (650 mg total) by mouth every 6 (six) hours as needed for mild pain, fever or headache.     Blood Glucose Monitoring Suppl (ACCU-CHEK GUIDE ME) w/Device KIT USE AS DIRECTED 1 kit 0   escitalopram (LEXAPRO) 20 MG tablet Take 1 tablet (20 mg  total) by mouth daily. 30 tablet 1   gabapentin (NEURONTIN) 100 MG capsule Take 1 capsule (100 mg total) by mouth 2 (two) times daily. 60 capsule 0   glucose blood (ACCU-CHEK GUIDE) test strip USE AS DIRECTED 100 each 12   insulin NPH-regular Human (NOVOLIN 70/30) (70-30) 100 UNIT/ML injection INJECT 3 TIMES A DAY JUST BEFORE EACH MEAL 45-35-25 40 mL 0   Insulin Syringe-Needle U-100 30G X 1/2" 0.5 ML MISC USE AS DIRECTED 100 each 0   Lancets MISC USE AS DIRECTED 100 each 0   levETIRAcetam (KEPPRA) 500 MG tablet Take 1 tablet (500 mg total) by mouth 2 (two) times daily. 60 tablet 0   levothyroxine (SYNTHROID) 50 MCG tablet Take 1 tablet (50 mcg total) by mouth daily at 6 (six) AM. 30 tablet 0   ondansetron (ZOFRAN ODT) 8 MG disintegrating tablet Take 1 tablet (8 mg total) by mouth every 8 (eight) hours as needed for nausea or vomiting. 20 tablet 0   pyridOXINE (B-6) 50 MG tablet Take 1 tablet (50 mg total) by mouth daily. 30 tablet 0   QUEtiapine (SEROQUEL) 400 MG tablet Take 1 tablet (400 mg total) by mouth at bedtime. 30 tablet 1   No current facility-administered medications for this visit.     Musculoskeletal: Strength & Muscle Tone: Unable to assess due to telemedicine visit [place]: Unable to assess due to telemedicine visit Patient leans: Unable to assess due to telemedicine visit  Psychiatric Specialty Exam: Review of Systems  Psychiatric/Behavioral:  Positive for agitation and sleep disturbance. Negative for decreased concentration, dysphoric mood, hallucinations, self-injury and suicidal ideas. The patient is nervous/anxious. The patient is not hyperactive.    There were no vitals taken for this visit.There is no height or weight on file to calculate BMI.  General Appearance: Unable to assess due to telemedicine visit  Eye Contact:  Unable to assess due to telemedicine visit  Speech:  Clear and Coherent and Normal Rate  Volume:  Normal  Mood:  Anxious, Depressed, and  Irritable  Affect:  Congruent and Depressed  Thought Process:  Coherent, Goal Directed, and Descriptions of Associations: Intact  Orientation:  Full (Time, Place, and Person)  Thought Content: WDL   Suicidal Thoughts:  No  Homicidal Thoughts:  No  Memory:  Immediate;   Fair Recent;   Fair Remote;   Poor  Judgement:  Good  Insight:  Good  Psychomotor Activity:  Normal  Concentration:  Concentration: Good and Attention Span: Good  Recall:  [place] of Knowledge: Fair  Language: Good  Akathisia:  NA  Handed:  Right  AIMS (if indicated): not done  Assets:  Communication Skills Desire for Improvement Housing Social Support  ADL's:  Intact  Cognition: Impaired,  Mild  Sleep:  Fair   Screenings: AIMS    Flowsheet Row Admission (Discharged) from 03/13/2020 in [place] [facility] Admission (Discharged) from 09/10/2017 in [place] Total Score 0 0      AUDIT    [place] Admission (Discharged) from 03/13/2020 in [place] [facility] Admission (Discharged) from 09/10/2017 in [place] Admission (Discharged) from 08/22/2017 in [place]  Alcohol Use Disorder Identification Test Final Score (AUDIT) 0 0 1      GAD-7    Flowsheet Row Video Visit from 08/08/2021 in [facility] Video Visit from 04/19/2021 in [place] from 03/24/2021 in Primary Care at [facility] Video Visit from 02/22/2021 in [facility] Office Visit from 12/05/2020 in Primary Care at [facility]  Total GAD-7 Score 15 14 20 20  0      PHQ2-9    Flowsheet Row Video Visit from 08/08/2021 in [facility] Video Visit from 04/19/2021 in [place] from 03/24/2021 in Primary Care at [facility] Video Visit from  02/22/2021 in [facility] Patient Outreach Telephone from 01/09/2021 in [place]  PHQ-2 Total Score 3 3 3 2 2   PHQ-9 Total Score 13 15 12 13 5       Flowsheet Row Video Visit from 08/08/2021 in [facility] Video Visit from 04/19/2021 in [facility] ED to Hosp-Admission (Discharged) from 03/21/2021 in [facility]/STEPDOWN  C-SSRS RISK CATEGORY Moderate Risk Moderate Risk No Risk        Assessment and Plan:   Hardin Hardenbrook is a 35 year old male with a past psychiatric history significant for insomnia, bipolar disorder, and generalized anxiety disorder who presents to [facility] via virtual telephone visit for follow-up and medication management.  Patient reports that his dosage adjustments made last encounter were somewhat helpful.  Patient still endorses depressive symptoms as well as anxiety, restlessness, and agitation.  Patient was recommended increasing his dosage of Seroquel from 300 mg to 400 mg at bedtime for the management of his agitation.  Patient was also recommended adding bupropion (Wellbutrin XL) 150 mg 24-hour tablet daily for 6 days and upping the dose to 300 mg for the management of his depressive symptoms.  Patient was agreeable to recommendations.  Patient's medications to be e-prescribed to pharmacy of choice.  1. Insomnia due to other mental disorder  - QUEtiapine (SEROQUEL) 400 MG tablet; Take 1 tablet (400 mg total) by mouth at bedtime.  Dispense: 30 tablet; Refill: 1  2. Bipolar affective disorder, current episode mixed, current episode severity unspecified (H[facility])  - QUEtiapine (SEROQUEL) 400 MG tablet; Take 1 tablet (400 mg total) by mouth at bedtime.  Dispense: 30 tablet; Refill: 1 - escitalopram (LEXAPRO) 20 MG tablet; Take 1 tablet (20 mg total) by mouth daily.  Dispense: 30 tablet; Refill: 1 - buPROPion  (WELLBUTRIN XL) 150 MG 24 hr tablet; Take 1  tablet (150 mg total) by mouth every morning. Patient to take 1 tablet (150 mg total) for 6 days then 2 tablets (300 mg total)  daily  Dispense: 30 tablet; Refill: 1  3. Generalized anxiety disorder  - escitalopram (LEXAPRO) 20 MG tablet; Take 1 tablet (20 mg total) by mouth daily.  Dispense: 30 tablet; Refill: 1  Patient to follow up in 2 months Provider spent a total of 17 minutes with the patient/reviewing patient's chart  Malachy Mood, PA 08/08/2021, 9:44 PM

## 2021-08-09 ENCOUNTER — Other Ambulatory Visit: Payer: Self-pay | Admitting: *Deleted

## 2021-08-09 NOTE — Patient Outreach (Signed)
[facility]) Care Management  08/09/2021  Mark [name] 13-Sep-1986 [id number]   Care Coordination    Subjective : Successful outreach call to patient at agreed to assist with scheduling medicaid transportation to upcoming medical appointments.  Patient discussed telephone visit with behavorial health and states medication changes,to Seroquel, and adding wellbutrin  reinforced getting prescriptions filled and taking as prescribed, patient roommate Mark [name] agrees to [facility].     Goals Addressed             This Visit's Progress    Enhance My Mental Skills       Timeframe:  Long-Range Goal Priority:  Medium Start Date:      06/02/21                       Expected End Date:   10/20/21                    Follow Up Date 08/30/21 Barriers: Psychological Impairment   - do word search or crossword puzzles daily  Attend all medical, behavioral health counseling appointmens.  .   Why is this important?   As we age, or sometimes because we have an illness, it feels like our memory and ability to figure things out is not very good.  There are things you can do to keep your memory and your thinking as strong as possible.    Notes:  10/13 Noted some improvement with sleeping , taking medications . 9/28- agreeable to follow up on timing of next behavorial health appointment, date/time reviewed with teachback, virtual appointment 10/18. 07/03/21 Agreeable to call to behavorial health regarding anxiety for follow up appointment to speak with provider. Reminded of emergency mental resources , 911, behavioral health crisis line.  06/14/21 Again reinforced taking medications adequate rest Stressed importance of taking medications as prescribed     Make and Keep All Appointments   On track    Timeframe:  Long-Range Goal Priority:  High Start Date:   11/23/20                          Expected End Date:   10/20/21            Follow Up Date:  08/30/21    - call to cancel if needed - keep a calendar with appointment dates  Reinforced keeping all appointments    Why is this important?   Part of staying healthy is seeing the doctor for follow-up care.  If you forget your appointments, there are some things y. you can do to stay on track.   Barriers: Health Behaviors Knowledge Other, forgetful  Notes: 10/19 outreach call to patient assisted with call to Medicaid transportation to schedule transportation to Endocrinology on 10/27 and 11/8 with PCP provided contact number of Mark [name] to outreach day before visit regarding pick up time. Provided Mark [name] Medicaid transportation number .  08/03/21 Reviewed upcoming appointments,  Behavioral health telephone visit assisting with transportation arrangements, for endocrinology visit on 10/27.  9/28 Assisted with rescheduling,appointment with endocrinology as patient request, assisted with call to [facility] health next appointment on 10/18 , virtual visit. Provided contact number to schedule PCP visit .   9/12 - patient agreeable to scheduling appointment with PCP regarding left foot wound, call to PCP office representative states she will contact patient, to schedule appointment and will arrange transportation if needed, discussed  patient appointment 9/15 at [facility]. Patient agreeable to transportation arrangement with [facility] for transportation, roommate Mark [name] to travel with him ride pick up time explained with roommate on phone.  8/24 Patient rescheduled endocrinology appt. Reviewed upcoming appt on 9/15 at [facility] for ID card, agreeable to follow up call to verify arranging transportation with [facility] and reminded of cost for this non medical appt. Verified contact number to arrange ride.  8/12 Patient calls for assistance with transportation to appt with endocrinology, assisted to with call to DSS medicaid transportation, identified patient will need medicaid transportation assessment  prior to arranging a ride. Assisted with arranging ride to next appointment with [facility] transportation , explained to patient need to use medicaid transportation benefit.  8/2 patient verifies having transportation for appt at [facility] for ID card scheduled. Congratulated on arranging transportation and attending EEG appt.  7/26 congratulated on rescheduling appointments with EEG and MRI and arranging ride, reinforced his transportation available with benefit of Medicaid for medical appointments and verified having contact number . Reviewed upcoming appointment at [facility] for ID cards and reminded to call to arrange transportation with [facility] since nonmedical appointment.  7/12 Agreeable to rescheduling missed EEG and MRI appointments calls to [facility] neurology office to return call to patient to reschedule. Assisted with transportation for 7/15 appt.with endocrinology, patient teachback pick up time . Missed appointment to be rescheduled, offered to assist with appointment rescheduled ,placed call to reschedule behavior health appointment he requested to reschedule one at a time. Behavorial health, EEG and CT scan appointments      Manage My Medicine   Not on track    Timeframe:  Long-Range Goal Priority:  Medium Start Date:   11/23/20                          Expected End Date:   08/20/21                 Follow Up Date: 08/30/21   - call for medicine refill 2 or 3 days before it runs out - keep a list of all the medicines I take; vitamins and herbals too  Encouraged taking medications as prescribed.    Why is this important?   These steps will help you keep on track with your medicines.  .Barriers: Financial , memory  Notes: 10/13 [facility], Discussed  proper insulin syringe and needle disposal will include education handout  07/19/21: Congratulated for consistency in taking insulin , stressed taking as prescribed. Patient request assistance with follow up on  refills on prescriptions, [facility] , roommate to pick up on 10/1, total cost $11.50.  07/03/21- Patient agreeable to assistance with [facility], agrees that roommate my pick up prescriptions.  8/24 reports improvement in sleeping having seroquel refill , taking insulin reinforced benefits of taking medications as prescribed. Good understanding of importance of insulin daily, encouraged taking as prescribed. He verifies continuing to take insulin as prescribed.  8/12 No sleeping , does not have seroquel and other oral medications, [facility]. [facility]. Reinforced importance of taking medications as prescribed.  7/26 Verifies having insulin on hand and taking , he is admits not taking all medication , reinforced importance. Discussed cost savings on medication since having medicaid benefit.  05/02/21  Urged importance regarding taking medication as prescribed, identifies taking insulin, seroquel, gabapentin. He declines reviewing other medications at this time He states being agreeable to pharmacy referral regarding medication organization and compliance due to memory. He request to wait until he gets new phone , will alert me with new number.  Patient reports having medication and taking as bottle label reads         Plan Patient agreeable to follow up call in the next 3 weeks.    [name], RN, BSN  [facility] Care Management,Care Management Coordinator  [phone]- Mobile [phone]- Toll Free Main Office

## 2021-08-11 ENCOUNTER — Encounter (HOSPITAL_COMMUNITY): Payer: Self-pay | Admitting: Physician Assistant

## 2021-08-11 ENCOUNTER — Other Ambulatory Visit: Payer: Self-pay | Admitting: *Deleted

## 2021-08-11 NOTE — Patient Outreach (Signed)
[facility]) Care Management  08/11/2021  Mark [name] [birth year] [id number]   Care Coordination    Received email message regarding patient call to [facility] on 10/20, patient requesting help with Mychart access.  Returned call to patient unsuccessful , received message  can't received calls as this time, [employer] message call can't be completed as dialed. Recall from prior patient conversation his phone would be out of service after 10/19.  Placed call to patient roommate Mark [name], he confirms patient phone not in service, currently patient is still sleeping.  [name] states he is willing for patient to use his phone to [employer]. He states he will have patient return call when he wakes up. Some poor phone connection at call.   Incoming return call from Mark [name], he states patient did not rest well last night and he is still sleeping, asked if patient was/looked okay he states yes  unsure if he got up earlier , he is still just resting and he does not want to disturb him as he has insomnia and he does not rest well some nights and sleeps later in the day, he discussed patient being punctual with taking his insulin.     Plan Agreeable to return call in the next week to assist with mychart.   [name], RN, BSN  [facility] Care Management,Care Management Coordinator  [phone]- Mobile [phone]- Toll Free Main Office

## 2021-08-16 ENCOUNTER — Other Ambulatory Visit: Payer: Self-pay | Admitting: *Deleted

## 2021-08-16 NOTE — Patient Outreach (Signed)
[facility]) Care Management  08/16/2021  FELIS QUILLIN Aug 24, 1986 [id number]   Care Coordination  Nurse Call line message   Subjective: Outreach call to patient to assist with mychart access as he had requested on nurse call line message for 08/10/21.  Placed to patient number no answer received message call can't be completed as dialed.  Placed call to patient roommate [name] phone number able to speak with patient that is agreeable to assistance with call to MyChart for assistance. After waiting for a few minutes while on hold with representative, patient asked to do this at another time, agreeable to next outreach call scheduled.    Plan Will plan return call to patient in the next 2 weeks as scheduled. Encouraged to call sooner if changes his mind. Offer Mychart contact number for assistance.    [name], RN, BSN  [facility] Care Management,Care Management Coordinator  [phone]- Mobile [phone]- Toll Free Main Office

## 2021-08-17 ENCOUNTER — Ambulatory Visit: Payer: 59 | Admitting: Endocrinology

## 2021-08-23 ENCOUNTER — Other Ambulatory Visit: Payer: Self-pay | Admitting: *Deleted

## 2021-08-23 ENCOUNTER — Telehealth (HOSPITAL_COMMUNITY): Payer: Self-pay | Admitting: *Deleted
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
## 2021-08-26 NOTE — Progress Notes (Signed)
Erroneous encounter

## 2021-08-29 ENCOUNTER — Encounter: Payer: 59 | Admitting: Family

## 2021-08-29 DIAGNOSIS — E1065 Type 1 diabetes mellitus with hyperglycemia: Secondary | ICD-10-CM

## 2021-08-30 ENCOUNTER — Other Ambulatory Visit: Payer: Self-pay | Admitting: *Deleted

## 2021-08-30 NOTE — Patient Outreach (Signed)
[facility]) Care Management  08/30/2021  BUCK MCAFFEE 11/16/1985 [id number]   Telephone Assessment     Referral received: 11/22/20 Referral source: [facility]  Subjective; Unsuccessful outreach call to patient , no answer able to leave a HIPAA compliant voicemail message for return call.   Plan If no return call will plan outreach in the 4 business days.   [name], RN, BSN  [facility] Care Management,Care Management Coordinator  [phone]- Mobile [phone]- Toll Free Main Office

## 2021-09-01 ENCOUNTER — Other Ambulatory Visit: Payer: Self-pay | Admitting: *Deleted

## 2021-09-01 NOTE — Patient Outreach (Addendum)
  [facility]) Care Management  [facility] Care Manager  09/01/2021   Mark [name] Jul 14, 1986 [id number]  Telephone Assessment   Subjective: Follow up call to patient he states not feeling well on today ,reports  that  he has a cold, when asked about fever or shortness of breath he denies. Discussed with patient seeking medical attention for worsening symptoms, he states he knows what to do, reports taking insulin , tolerating fluids. States he does not feel like talking further.    Plan:  Follow-up: Patient agreeable to return call in the next 2 business days. Discuss plan of transition, connection with medicaid care worker.   [name], RN, BSN  [facility] Care Management,Care Management Coordinator  [phone]- Mobile [phone]- Toll Free Main Office

## 2021-09-04 ENCOUNTER — Other Ambulatory Visit: Payer: Self-pay | Admitting: *Deleted

## 2021-09-04 NOTE — Patient Outreach (Signed)
[facility]) Care Management  [place]  09/04/2021   Mark [name] [birth year] [id number]  Telephone Assessment     Referral received: 11/22/20 Referral source: [facility] Last Admission 03/21/21 - hyperglycemia .  35 year old male, with PMHX: Diabetes type 1, Right leg DVT, Diabetic polyneuropathy, depression disorder .  Subjective:  Successful outreach call to patient, he reports feeling much better states cold symptoms relieved. He reports feeling better physically ,in good spirits,  noted resting better when taking seroquel, reports needs refill and his roommate will call today and pick up medications.     Encounter Medications:  Outpatient Encounter Medications as of 09/04/2021  Medication Sig   acetaminophen (TYLENOL) 325 MG tablet Take 2 tablets (650 mg total) by mouth every 6 (six) hours as needed for mild pain, fever or headache.   Blood Glucose Monitoring Suppl (ACCU-CHEK GUIDE ME) w/Device KIT USE AS DIRECTED   buPROPion (WELLBUTRIN XL) 150 MG 24 hr tablet Take 1 tablet (150 mg total) by mouth every morning. Patient to take 1 tablet (150 mg total) for 6 days then 2 tablets (300 mg total)  daily   escitalopram (LEXAPRO) 20 MG tablet Take 1 tablet (20 mg total) by mouth daily.   gabapentin (NEURONTIN) 100 MG capsule Take 1 capsule (100 mg total) by mouth 2 (two) times daily.   glucose blood (ACCU-CHEK GUIDE) test strip USE AS DIRECTED   insulin NPH-regular Human (NOVOLIN 70/30) (70-30) 100 UNIT/ML injection INJECT 3 TIMES A DAY JUST BEFORE EACH MEAL 45-35-25   Insulin Syringe-Needle U-100 30G X 1/2" 0.5 ML MISC USE AS DIRECTED   Lancets MISC USE AS DIRECTED   levETIRAcetam (KEPPRA) 500 MG tablet Take 1 tablet (500 mg total) by mouth 2 (two) times daily.   levothyroxine (SYNTHROID) 50 MCG tablet Take 1 tablet (50 mcg total) by mouth daily at 6 (six) AM.   ondansetron (ZOFRAN ODT) 8 MG disintegrating tablet Take 1 tablet (8 mg total) by mouth  every 8 (eight) hours as needed for nausea or vomiting.   pyridOXINE (B-6) 50 MG tablet Take 1 tablet (50 mg total) by mouth daily.   QUEtiapine (SEROQUEL) 400 MG tablet Take 1 tablet (400 mg total) by mouth at bedtime.   No facility-administered encounter medications on file as of 09/04/2021.    Functional Status:  In your present state of health, do you have any difficulty performing the following activities: 03/22/2021  Hearing? N  Vision? N  Difficulty concentrating or making decisions? N  Walking or climbing stairs? Y  Dressing or bathing? N  Doing errands, shopping? N  Some recent data might be hidden    Fall/Depression Screening: Fall Risk  03/24/2021 12/29/2020 12/05/2020  Falls in the past year? 1 1 1   Number falls in past yr: 1 1 1   Injury with Fall? 0 1 1  Risk for fall due to : Impaired balance/gait Impaired balance/gait;Impaired mobility Impaired balance/gait;Impaired mobility;History of fall(s)  Follow up Falls evaluation completed Falls evaluation completed Falls evaluation completed   [facility] 2/9 Scores 03/24/2021 01/09/2021 12/29/2020 12/05/2020 12/01/2020  PHQ - 2 Score 3 2 2 2 2   PHQ- 9 Score 12 5 6 6 6   Some encounter information is confidential and restricted. Go to Review Flowsheets activity to see all data.    Assessment:  Discussed patient ongoing management and non adherence to plan of care, not progressing.. Discussed goal of program, patient agrees to case closure .He voiced appreciation for support over the  last months.  He voiced appreciation for support over the last months. Patient also discussed that [facility] ending in December. He is agreeable to phone call to [employer] - spoke with representative, that states since patient has Medicaid to contact social services department .  Marland Kitchen  He request assistance making contact with Medicaid case worker regarding ongoing benefits, after assisting and being on hold for an hour patient states that  he can call on the next day, he states his roommate [name]  will assist him, verbally gave patient and rooommate contact number to Department of social services, medicaid social worker Engineer, civil (consulting) number and agrees to written copy of contact numbers Patient and roommate assures me that they will make follow up calls and has contact number  regarding follow up on [employer] ending, states roommate has the letter and will call, he declined further assistance. Verifies having [facility] care management contact number if new concerns.   Care Plan Care Plan : Diabetes Type 1 (Adult)  Updates made by Alfonzo Feller, RN since 09/04/2021 12:00 AM     Problem: Glycemic Management (Diabetes, Type 1) Resolved 09/04/2021  Priority: High  Onset Date: 11/23/2020     Problem: Disease Progression (Diabetes, Type 1) Resolved 09/04/2021  Priority: Medium  Note:   Patient non adherent to plan of care    Long-Range Goal: Disease Progression Prevented or Minimized patient will maintain A1c at 7 range over the next 90 days Completed 09/04/2021  Start Date: 11/23/2020  Expected End Date: 09/20/2021  Priority: Medium  Note:   Evidence-based guidance:   . Prepare patient for consultation or referral for specialist care, such as ophthalmology, neurology, endocrinology.. Barriers: Health Behaviors Financial Knowledge Other   Memory issues  09/04/21 , Case closed , patient taking insulin as prescribed, consistent meals daily. Declines monitoring blood sugars, reinforced adherence to taking all medications, keeping all medical appointments.  08/03/21 reviewed recent symptoms of hypo/hyperglycemia, denies hypo glycemia, making sure he takes his insulin with symptoms of hyperglycema  9/12, patient acknowleges having endocrinologist appointment in the next month, urged follow up , stressed importance diabetes glucose control by taking insulin as prescribed, seeking medical attention  sooner related to new wounds .  8/24- Encouraged regarding annual follow up with eye doctor, agrees to address at another visit  8/12, Reinforced follow up with diabetes provider and adherence to recommended treatment plan, verifies having insulin reports continues to take 2 to 3 times a day.  7/26- Reinforced importance of taking insulin as prescribed,regimen by provider ,  filling new prescription prior to running out of medication.  Note : 7/12 Encouraged importance in adherence plan to prescribed insulin schedule. Discussed endocrinologist plan for insulin dosing , urged taking as advised, to avoid low readings, and keep blood sugar controlled . Verified having insulin on hand     Task: Monitor and Manage Follow-up for Comorbidities Completed 09/04/2021  Due Date: 09/20/2021  Priority: Routine  Note:   Care Management Activities:    - healthy lifestyle promoted - signs/symptoms of comorbidities identified  Limits sweet sodas.    Notes:  10/13 - Encouraged provider follow up, annual flu shot, eye exam, has declined covid vaccines.  9/28 Reinforced attending all medical appointments, stressed benefits of annual maintenance care, such as eye exam, flu shot and monitoring for control of diabetes getting A1c checked. Reminded provider may check at visit in the next month.Stressed balanced diet.  06/14/21 reinforced measures for  blood sugar control taking medication,limiting daily sweet cola, balanced diet to include vegetables, protein and carbs review.  Discuss A1c goals and follow up plan for managing diabetes.  7/12 dicussed ongoing diabetes disease management such as annual eye exam, A1c in control . Discussed limits sweet soda to avoid elevated blood sugars. Discussed food choices in the home balanced meals and food intake , limit snacks     Care Plan : Fall Risk (Adult)  Updates made by Alfonzo Feller, RN since 09/04/2021 12:00 AM     Problem: Fall Risk   Onset Date: 12/13/2020   Note:   Patient nonadherent to plan     Long-Range Goal: Absence of Fall and Fall-Related Injury Completed 09/04/2021  Start Date: 12/28/2020  Expected End Date: 10/20/2021  Priority: Medium  Note:    Communicate fall injury risk to interprofessional healthcare team.  Develop a fall prevention plan with the patient and family   If fall occurs, determine the cause and revise fall injury prevention plan.  Regularly review medication contribution to fall risk; consider risk related to polypharmacy and age.  Barriers: Health Behaviors Transportation  09/04/21 Case Closed  Notes: 8/24, Reports having falls , denies injury.Patient reports recent fall , no injury, reinforced importance of neurology follow up and completion of test for evaluation. He discussed cancelling MRI due to cost of copayment, he reports will rescheduled in the future Fall precautions reviewed reinforced importance of taking medications as prescribed and notifying MD of increase in falls or injury.  7/12 Discussed importance of taking medication as prescribed, encouraged use of support devices such as walker/cane. Denies fall with injury  reports recent fall no injury, reinforced importance of attending recommended test for medical plan .     Task: Identify and Manage Contributors to Fall Risk Completed 09/04/2021  Due Date: 10/20/2021  Priority: Routine  Note:   Care Management Activities:    Reinforced using walker/wheelchair , changing positions slowly- assistive or adaptive device use encouraged - fall prevention plan reviewed and updated    Notes 10/13 reviewed recent fall history , reports having sporadic falls comes and goes, denies recent injury.  Encouraged PCP follow up for evaluation,agreeable to assistance with scheduling.  Explained importance of follow up regarding new foot wound and concern related to increased for fall due to balance issues.  Fall prevention measures reinforced, use of walker,  changing positions slowly, balanced diet, taking medications as prescribed. Reinforced attending MD recommended appointment and test for evaluations.        Goals Addressed             This Visit's Progress    COMPLETED: Enhance My Mental Skills   On track    Timeframe:  Long-Range Goal Priority:  Medium Start Date:      06/02/21                       Expected End Date:   10/20/21                    Follow Up Date 08/30/21 Barriers: Psychological Impairment   - do word search or crossword puzzles daily  Attend all medical, behavioral health counseling appointmens.  .   Why is this important?   As we age, or sometimes because we have an illness, it feels like our memory and ability to figure things out is not very good.  There are things you can do to keep your memory and  your thinking as strong as possible.    Notes:  09/04/21- reports improvement with taking medications and resting better, reinforced continued compliance.      COMPLETED: Make and Keep All Appointments   Not on track    Timeframe:  Long-Range Goal Priority:  High Start Date:   11/23/20                          Expected End Date:   10/20/21            Follow Up Date: 08/30/21    - call to cancel if needed - keep a calendar with appointment dates  Reinforced keeping all appointments    Why is this important?   Part of staying healthy is seeing the doctor for follow-up care.  If you forget your appointments, there are some things y. you can do to stay on track.   Barriers: Health Behaviors Knowledge Other, forgetful  Notes: 11/14 - non adherent to plan - he discussed having his friend support to help with rescheduling appointments. Will send list of resource contact numbers for providers and resources contacts. 10/19 outreach call to patient assisted with call to Medicaid transportation to schedule transportation to Endocrinology on 10/27 and 11/8 with PCP provided contact number of Myra Gianotti to  outreach day before visit regarding pick up time. Provided Jefferey Pica Medicaid transportation number .      COMPLETED: Manage My Medicine   Not on track    Timeframe:  Long-Range Goal Priority:  Medium Start Date:   11/23/20                          Expected End Date:   08/20/21                 Follow Up Date: 08/30/21   - call for medicine refill 2 or 3 days before it runs out - keep a list of all the medicines I take; vitamins and herbals too  Encouraged taking medications as prescribed.    Why is this important?   These steps will help you keep on track with your medicines.  .Barriers: Financial , memory  Notes: 11/14- patient roommate and friend agreeable to call for refill on medication today and pickup. Consistently taking insulin 3 times daily.      COMPLETED: Monitor and Manage My Blood Sugar-Diabetes Type 1   Not on track    Timeframe:  Long-Range Goal Priority:  High Start Date:   12/13/20                          Expected End Date:   06/14/21               Follow Up Date 08/30/21 - check blood sugar if I feel it is too high or too low reinforced    Why is this important?   Checking your blood sugar at home helps to keep it from getting very high or very low.  Writing the results in a diary or log helps the doctor know how to care for you.  Your blood sugar log should have the time, the date and the results.  Also, write down the amount of insulin or other medicine you take.  Other information like what you ate, exercise done and how you were feeling will also be helpful..   Barriers: Financial  Notes: 09/04/21= non adherent to plan of care  10/13 Not monitoring blood sugars, reinforced importance. 8/24, Not monitoring blood sugars, declines assistance with education on use of monitor reports understanding.  8/12 continues to not monitor bloods sugar readings, admits having supplies on hand  05/16/21 No working on at this time, urged importance.Denies having  hypoglycemia symptoms   7/12, urged  monitoring readings as recommended to help identify blood sugar readings  and control. Reviewed signs/symptoms of hyperglycemia .  Patient now has cbg meter, supplies to monitor readings, verbalizes understanding how to use , encouraged use      COMPLETED: Set My Target A1C   Not on track    Timeframe:  Short-Term Goal Priority:  Medium Start Date:   12/13/20                          Expected End Date:  10/20/21                  Follow Up Date : 08/30/21   - set target A1C    Why is this important?   Your target A1C is decided together by you and your doctor.  It is based on several things like your age and other health issues.    Notes:  09/02/21- has not attended last 2 scheduled endocrinology appointments, has contact number to reschedule appointment with assist from roommate. Not adhering to plan of care.  1        Plan:  Follow-up: Patient requests no follow-up at this time. Will close case to [facility] care management at this time.  Will send PCP case closure letter, Will send patient education letter with contact numbers of provider information, medicaid worker resource information.    Joylene Draft, RN, BSN  [place] Management Coordinator  [phone]- Mobile [phone]- Toll Free Main Office

## 2021-10-05 ENCOUNTER — Telehealth (HOSPITAL_COMMUNITY): Payer: 59 | Admitting: Physician Assistant

## 2021-10-05 NOTE — Telephone Encounter (Signed)
Message acknowledged and reviewed. Provider to try and schedule a sooner follow up appointment for this patient.

## 2021-10-13 ENCOUNTER — Telehealth (HOSPITAL_COMMUNITY): Payer: 59 | Admitting: Physician Assistant

## 2021-11-03 ENCOUNTER — Telehealth (HOSPITAL_COMMUNITY): Payer: 59 | Admitting: Physician Assistant

## 2021-12-11 ENCOUNTER — Emergency Department (HOSPITAL_COMMUNITY): Payer: Medicaid Other

## 2021-12-11 ENCOUNTER — Inpatient Hospital Stay (HOSPITAL_COMMUNITY)
Admission: EM | Admit: 2021-12-11 | Discharge: 2021-12-13 | DRG: 638 | Disposition: A | Discharge status: Home / Self Care | Payer: Medicaid Other | Attending: Critical Care Medicine | Admitting: Critical Care Medicine

## 2021-12-11 DIAGNOSIS — E10649 Type 1 diabetes mellitus with hypoglycemia without coma: Principal | ICD-10-CM | POA: Diagnosis present

## 2021-12-11 DIAGNOSIS — F17210 Nicotine dependence, cigarettes, uncomplicated: Secondary | ICD-10-CM | POA: Diagnosis present

## 2021-12-11 DIAGNOSIS — M6282 Rhabdomyolysis: Secondary | ICD-10-CM | POA: Diagnosis present

## 2021-12-11 DIAGNOSIS — F5105 Insomnia due to other mental disorder: Secondary | ICD-10-CM

## 2021-12-11 DIAGNOSIS — F3160 Bipolar disorder, current episode mixed, unspecified: Secondary | ICD-10-CM

## 2021-12-11 DIAGNOSIS — E876 Hypokalemia: Secondary | ICD-10-CM | POA: Diagnosis present

## 2021-12-11 DIAGNOSIS — Z91018 Allergy to other foods: Secondary | ICD-10-CM

## 2021-12-11 DIAGNOSIS — Z20822 Contact with and (suspected) exposure to covid-19: Secondary | ICD-10-CM | POA: Diagnosis present

## 2021-12-11 DIAGNOSIS — Z882 Allergy status to sulfonamides status: Secondary | ICD-10-CM

## 2021-12-11 DIAGNOSIS — Z833 Family history of diabetes mellitus: Secondary | ICD-10-CM

## 2021-12-11 DIAGNOSIS — E039 Hypothyroidism, unspecified: Secondary | ICD-10-CM | POA: Diagnosis present

## 2021-12-11 DIAGNOSIS — E1065 Type 1 diabetes mellitus with hyperglycemia: Secondary | ICD-10-CM

## 2021-12-11 DIAGNOSIS — Z982 Presence of cerebrospinal fluid drainage device: Secondary | ICD-10-CM

## 2021-12-11 DIAGNOSIS — Z880 Allergy status to penicillin: Secondary | ICD-10-CM

## 2021-12-11 DIAGNOSIS — G4700 Insomnia, unspecified: Secondary | ICD-10-CM | POA: Diagnosis present

## 2021-12-11 DIAGNOSIS — F411 Generalized anxiety disorder: Secondary | ICD-10-CM | POA: Diagnosis present

## 2021-12-11 DIAGNOSIS — Z79899 Other long term (current) drug therapy: Secondary | ICD-10-CM

## 2021-12-11 DIAGNOSIS — G40909 Epilepsy, unspecified, not intractable, without status epilepticus: Secondary | ICD-10-CM | POA: Diagnosis present

## 2021-12-11 DIAGNOSIS — F1510 Other stimulant abuse, uncomplicated: Secondary | ICD-10-CM | POA: Diagnosis present

## 2021-12-11 DIAGNOSIS — Z881 Allergy status to other antibiotic agents status: Secondary | ICD-10-CM

## 2021-12-11 DIAGNOSIS — Z9114 Patient's other noncompliance with medication regimen: Secondary | ICD-10-CM

## 2021-12-11 DIAGNOSIS — G9340 Encephalopathy, unspecified: Secondary | ICD-10-CM

## 2021-12-11 DIAGNOSIS — F319 Bipolar disorder, unspecified: Secondary | ICD-10-CM | POA: Diagnosis present

## 2021-12-11 DIAGNOSIS — E1042 Type 1 diabetes mellitus with diabetic polyneuropathy: Secondary | ICD-10-CM | POA: Diagnosis present

## 2021-12-11 DIAGNOSIS — Z794 Long term (current) use of insulin: Secondary | ICD-10-CM

## 2021-12-11 DIAGNOSIS — F1210 Cannabis abuse, uncomplicated: Secondary | ICD-10-CM | POA: Diagnosis present

## 2021-12-11 DIAGNOSIS — E162 Hypoglycemia, unspecified: Secondary | ICD-10-CM

## 2021-12-11 DIAGNOSIS — Z79890 Hormone replacement therapy (postmenopausal): Secondary | ICD-10-CM

## 2021-12-11 LAB — CBG MONITORING, ED: Glucose-Capillary: 15 mg/dL — CL (ref 70–99)

## 2021-12-11 LAB — URINALYSIS, ROUTINE W REFLEX MICROSCOPIC
Bacteria, UA: NONE SEEN
Bilirubin Urine: NEGATIVE
Glucose, UA: NEGATIVE mg/dL
Ketones, ur: NEGATIVE mg/dL
Leukocytes,Ua: NEGATIVE
Nitrite: NEGATIVE
Protein, ur: NEGATIVE mg/dL
Specific Gravity, Urine: 1.012 (ref 1.005–1.030)
pH: 6 (ref 5.0–8.0)

## 2021-12-11 MED ORDER — DEXTROSE 50 % IV SOLN
INTRAVENOUS | Status: AC
  Administered 2021-12-11: 50 mL via INTRAVENOUS
  Filled 2021-12-11: qty 50

## 2021-12-11 MED ORDER — DEXTROSE 50 % IV SOLN: 1.0000 | Freq: Once | INTRAVENOUS | Status: AC

## 2021-12-11 MED ORDER — DEXTROSE-NACL 10-0.45 % IV SOLN
INTRAVENOUS | Status: DC
  Filled 2021-12-11 (×5): qty 1000

## 2021-12-11 MED ORDER — SODIUM CHLORIDE 0.9 % IV BOLUS: 1000.0000 mL | Freq: Once | INTRAVENOUS | Status: DC

## 2021-12-11 NOTE — ED Triage Notes (Signed)
Pt bib [facility] from home . Roommate called EMS saying Pt was having seizure like activity. Pt was found in his room with tonic-clonic movement. Pt was unresponsive with snoring RR and had an NPA in place, it was removed when pt became responsive CBG was 41, pupils were equal, round and reactive .EMS gave 25g of Dextrose. EMS was out there earlier in the day with a CBG in the 40s, gave the pt dextrose and pt refused to go to the hospital. Last CBG 44.   EMS vitals 144/92 96 HR 16 RR

## 2021-12-11 NOTE — ED Provider Notes (Signed)
[facility]-EMERGENCY DEPT [facility] Emergency Department Provider Note MRN:  [id number]  Arrival date & time: 12/12/21     Chief Complaint   Seizure History of Present Illness   Mark [name] is a 36 y.o. year-old male with a history of diabetes, hydrocephalus status post VP shunt presenting to the ED with chief complaint of seizure.  EMS called for seizure activity at home.  Sonorous respirations, found to be hypoglycemic in the field.   Review of Systems  I was unable to obtain a full/accurate HPI, PMH, or ROS due to the patient's altered mental status.  Patient's Health History    Past Medical History:  Diagnosis Date   Anxiety    Bipolar disorder (HCC)    Depression    Diabetes mellitus without complication (HCC)    Diabetic polyneuropathy associated with type 1 diabetes mellitus (HCC)    Hydrocephalus (HCC)    Kidney stones    Seizures (HCC)    Truncal ataxia     Past Surgical History:  Procedure Laterality Date   BRAIN SURGERY     hydrocephalus   KIDNEY STONE SURGERY      Family History  Problem Relation Age of Onset   Breast cancer Mother    Other Father        unsure of medical history   Diabetes Paternal Aunt     Social History   Socioeconomic History   Marital status: Single    Spouse name: Not on file   Number of children: 0   Years of education: 9th grade   Highest education level: Not on file  Occupational History   Occupation: seeking disability  Tobacco Use   Smoking status: Every Day    Packs/day: 1.00    Years: 20.00    Pack years: 20.00    Types: Cigarettes   Smokeless tobacco: Never  Vaping Use   Vaping Use: Never used  Substance and Sexual Activity   Alcohol use: Yes    Comment: occassional    Drug use: Yes    Frequency: 7.0 times per week    Types: Marijuana    Comment: smoke daily   Sexual activity: Not Currently  Other Topics Concern   Not on file  Social History Narrative   Lives with a roommate.   Right-handed.    Drinks 2 liter of soda each day.   Social Determinants of Health   Financial Resource Strain: Medium Risk   Difficulty of Paying Living Expenses: Somewhat hard  Food Insecurity: Geophysicist/field seismologist Present   Worried About Programme researcher, broadcasting/film/video in the Last Year: Sometimes true   Barista in the Last Year: Sometimes true  Transportation Needs: Unmet Transportation Needs   Lack of Transportation (Medical): Yes   Lack of Transportation (Non-Medical): Yes  Physical Activity: Not on file  Stress: Not on file  Social Connections: Not on file  Intimate Partner Violence: Not on file     Physical Exam   Vitals:   12/12/21 0545 12/12/21 0600  BP: 137/90 (!) 140/93  Pulse: 83 82  Resp: 12 14  Temp:    SpO2: 100% 100%    CONSTITUTIONAL: Ill-appearing, NAD NEURO/PSYCH: Somnolent but wakes and follows commands, moves all extremities EYES:  eyes equal and reactive ENT/NECK:  no LAD, no JVD CARDIO: Regular rate, well-perfused, normal S1 and S2 PULM:  CTAB no wheezing or rhonchi GI/GU:  non-distended, non-tender MSK/SPINE:  No gross deformities, no edema SKIN:  no rash, atraumatic   *  Additional and/or pertinent findings included in MDM below  Diagnostic and Interventional Summary    EKG Interpretation  Date/Time:  Monday December 11 2021 23:20:27 EST Ventricular Rate:  92 PR Interval:  168 QRS Duration: 84 QT Interval:  364 QTC Calculation: 451 R Axis:   68 Text Interpretation: Sinus rhythm Confirmed by Kennis Carina [phone]) on 12/12/2021 12:05:53 AM       Labs Reviewed  CBC - Abnormal; Notable for the following components:      Result Value   WBC 11.5 (*)    RBC 4.10 (*)    All other components within normal limits  COMPREHENSIVE METABOLIC PANEL - Abnormal; Notable for the following components:   Potassium 2.9 (*)    Calcium 8.8 (*)    AST 44 (*)    All other components within normal limits  ACETAMINOPHEN LEVEL - Abnormal; Notable for the following components:    Acetaminophen (Tylenol), Serum <10 (*)    All other components within normal limits  SALICYLATE LEVEL - Abnormal; Notable for the following components:   Salicylate Lvl <7.0 (*)    All other components within normal limits  CK - Abnormal; Notable for the following components:   Total CK 1,007 (*)    All other components within normal limits  URINALYSIS, ROUTINE W REFLEX MICROSCOPIC - Abnormal; Notable for the following components:   Hgb urine dipstick SMALL (*)    All other components within normal limits  RAPID URINE DRUG SCREEN, HOSP PERFORMED - Abnormal; Notable for the following components:   Amphetamines POSITIVE (*)    Tetrahydrocannabinol POSITIVE (*)    All other components within normal limits  CBG MONITORING, ED - Abnormal; Notable for the following components:   Glucose-Capillary 15 (*)    All other components within normal limits  CBG MONITORING, ED - Abnormal; Notable for the following components:   Glucose-Capillary 29 (*)    All other components within normal limits  CBG MONITORING, ED - Abnormal; Notable for the following components:   Glucose-Capillary 58 (*)    All other components within normal limits  I-STAT VENOUS BLOOD GAS, ED - Abnormal; Notable for the following components:   pO2, Ven 46 (*)    Potassium 2.9 (*)    All other components within normal limits  CBG MONITORING, ED - Abnormal; Notable for the following components:   Glucose-Capillary 61 (*)    All other components within normal limits  CBG MONITORING, ED - Abnormal; Notable for the following components:   Glucose-Capillary 60 (*)    All other components within normal limits  CBG MONITORING, ED - Abnormal; Notable for the following components:   Glucose-Capillary 61 (*)    All other components within normal limits  CBG MONITORING, ED - Abnormal; Notable for the following components:   Glucose-Capillary 63 (*)    All other components within normal limits  RESP PANEL BY RT-PCR (FLU A&B, COVID)  ARPGX2  LACTIC ACID, PLASMA  PROTIME-INR  ETHANOL  CBG MONITORING, ED    CT HEAD WO CONTRAST ( )  Final Result    CT CERVICAL SPINE WO CONTRAST  Final Result    DG Chest Port 1 View  Final Result    DG Abd 1 View  Final Result      Medications  dextrose 20 % infusion ( Intravenous New Bag/Given 12/12/21 0606)  dextrose 50 % solution 50 mL (50 mLs Intravenous Given 12/11/21 2316)  dextrose 50 % solution 50 mL (50 mLs Intravenous Given 12/12/21 0027)  dextrose 50 % solution 50 mL (50 mLs Intravenous Given 12/12/21 0108)  dextrose 50 % solution 50 mL (50 mLs Intravenous Given 12/12/21 0507)     Procedures  /  Critical Care .Critical Care Performed by: Sabas Sous, MD Authorized by: Sabas Sous, MD   Critical care provider statement:    Critical care time (minutes):  45   Critical care was necessary to treat or prevent imminent or life-threatening deterioration of the following conditions:  Metabolic crisis (Critical hypoglycemia)   Critical care was time spent personally by me on the following activities:  Development of treatment plan with patient or surrogate, discussions with consultants, evaluation of patient's response to treatment, examination of patient, ordering and review of laboratory studies, ordering and review of radiographic studies, ordering and performing treatments and interventions, pulse oximetry, re-evaluation of patient's condition and review of old charts  ED Course and Medical Decision Making  Initial Impression and Ddx Patient presenting with seizures in the setting of profound hypoglycemia.  Given D50 with EMS.  On my initial evaluation patient is lucid and conversant but then starts to become somnolent, sugar rechecked and is 15.  Providing further D50 amps, D10 drip.  Patient denies intentional insulin overdose, does not think he took too much insulin.  Denies infectious symptoms recently.  EMS suspicious that he was huffing butane prior to this ED  presentation.  Past medical/surgical history that increases complexity of ED encounter: Alcohol use  Interpretation of Diagnostics I personally reviewed the EKG and my interpretation is as follows: Sinus rhythm    Labs are overall reassuring with no significant blood count or electrolyte disturbance.  Persistently hypoglycemic.  Patient Reassessment and Ultimate Disposition/Management Patient requiring D10 drip.  On the D10 drip still requiring D50 amps and a food with persistent blood glucose in the 60s.  Starting D20 drip, which will require ICU admission.  Patient management required discussion with the following services or consulting groups:  Hospitalist Service and Intensivist Service  Complexity of Problems Addressed Acute illness or injury that poses threat of life of bodily function  Additional Data Reviewed and Analyzed Further history obtained from: Further history from spouse/family member  Additional Factors Impacting ED Encounter Risk Consideration of hospitalization  Rufus Beske. Pilar Plate, MD [facility] Emergency Medicine [facility] [email or web address]  Final Clinical Impressions(s) / ED Diagnoses     ICD-10-CM   1. Hypoglycemia  E16.2       ED Discharge Orders     None        Discharge Instructions Discussed with and Provided to Patient:   Discharge Instructions   None      Sabas Sous, MD 12/12/21 [phone]

## 2021-12-11 NOTE — ED Notes (Signed)
RN went to re-check pt blood sugar. Pt became verbally and physically abusive yelling for food and his cell phone. RN found cell phone under pt. RN explained to pt that he had multiple seizures today and snoring respirations prior to arrival. MD did not think it was safe for pt to eat due to being an aspiration risk. RN educated pt on the risks of seizing again and aspiration. Pt stated "I do not care, I have had a tube down my throat before." Pt then began stating "I'm going to leave I do not care, I dont want to be here." RN got MD at bedside. RN and MD explained to the pt the consequences of leaving. MD declared pt to be in his right mind. MD agreed to let pt have food and brought food to the pt. Pt is now connected back to monitor, and eating. Plan of care discussed with pt and pt is agreeable.

## 2021-12-12 ENCOUNTER — Emergency Department (HOSPITAL_COMMUNITY): Payer: Medicaid Other

## 2021-12-12 ENCOUNTER — Inpatient Hospital Stay (HOSPITAL_COMMUNITY): Payer: Medicaid Other

## 2021-12-12 DIAGNOSIS — E10649 Type 1 diabetes mellitus with hypoglycemia without coma: Secondary | ICD-10-CM | POA: Diagnosis not present

## 2021-12-12 DIAGNOSIS — E162 Hypoglycemia, unspecified: Secondary | ICD-10-CM | POA: Diagnosis not present

## 2021-12-12 DIAGNOSIS — G4700 Insomnia, unspecified: Secondary | ICD-10-CM | POA: Diagnosis present

## 2021-12-12 DIAGNOSIS — M6282 Rhabdomyolysis: Secondary | ICD-10-CM | POA: Insufficient documentation

## 2021-12-12 DIAGNOSIS — G9340 Encephalopathy, unspecified: Secondary | ICD-10-CM

## 2021-12-12 DIAGNOSIS — Z982 Presence of cerebrospinal fluid drainage device: Secondary | ICD-10-CM | POA: Diagnosis not present

## 2021-12-12 DIAGNOSIS — E876 Hypokalemia: Secondary | ICD-10-CM | POA: Diagnosis not present

## 2021-12-12 DIAGNOSIS — F319 Bipolar disorder, unspecified: Secondary | ICD-10-CM | POA: Diagnosis present

## 2021-12-12 DIAGNOSIS — F17210 Nicotine dependence, cigarettes, uncomplicated: Secondary | ICD-10-CM | POA: Diagnosis present

## 2021-12-12 DIAGNOSIS — E039 Hypothyroidism, unspecified: Secondary | ICD-10-CM | POA: Diagnosis present

## 2021-12-12 DIAGNOSIS — Z91018 Allergy to other foods: Secondary | ICD-10-CM | POA: Diagnosis not present

## 2021-12-12 DIAGNOSIS — F1510 Other stimulant abuse, uncomplicated: Secondary | ICD-10-CM | POA: Diagnosis present

## 2021-12-12 DIAGNOSIS — Z79890 Hormone replacement therapy (postmenopausal): Secondary | ICD-10-CM | POA: Diagnosis not present

## 2021-12-12 DIAGNOSIS — G40909 Epilepsy, unspecified, not intractable, without status epilepticus: Secondary | ICD-10-CM | POA: Diagnosis present

## 2021-12-12 DIAGNOSIS — Z9114 Patient's other noncompliance with medication regimen: Secondary | ICD-10-CM | POA: Diagnosis not present

## 2021-12-12 DIAGNOSIS — Z79899 Other long term (current) drug therapy: Secondary | ICD-10-CM | POA: Diagnosis not present

## 2021-12-12 DIAGNOSIS — Z20822 Contact with and (suspected) exposure to covid-19: Secondary | ICD-10-CM | POA: Diagnosis present

## 2021-12-12 DIAGNOSIS — Z833 Family history of diabetes mellitus: Secondary | ICD-10-CM | POA: Diagnosis not present

## 2021-12-12 DIAGNOSIS — Z882 Allergy status to sulfonamides status: Secondary | ICD-10-CM | POA: Diagnosis not present

## 2021-12-12 DIAGNOSIS — Z794 Long term (current) use of insulin: Secondary | ICD-10-CM | POA: Diagnosis not present

## 2021-12-12 DIAGNOSIS — Z881 Allergy status to other antibiotic agents status: Secondary | ICD-10-CM | POA: Diagnosis not present

## 2021-12-12 DIAGNOSIS — F1210 Cannabis abuse, uncomplicated: Secondary | ICD-10-CM | POA: Diagnosis present

## 2021-12-12 DIAGNOSIS — F411 Generalized anxiety disorder: Secondary | ICD-10-CM | POA: Diagnosis present

## 2021-12-12 DIAGNOSIS — Z880 Allergy status to penicillin: Secondary | ICD-10-CM | POA: Diagnosis not present

## 2021-12-12 DIAGNOSIS — E1042 Type 1 diabetes mellitus with diabetic polyneuropathy: Secondary | ICD-10-CM | POA: Diagnosis present

## 2021-12-12 LAB — I-STAT VENOUS BLOOD GAS, ED
Acid-Base Excess: 1 mmol/L (ref 0.0–2.0)
Bicarbonate: 26.8 mmol/L (ref 20.0–28.0)
Calcium, Ion: 1.18 mmol/L (ref 1.15–1.40)
HCT: 40 % (ref 39.0–52.0)
Hemoglobin: 13.6 g/dL (ref 13.0–17.0)
O2 Saturation: 79 %
Potassium: 2.9 mmol/L — ABNORMAL LOW (ref 3.5–5.1)
Sodium: 141 mmol/L (ref 135–145)
TCO2: 28 mmol/L (ref 22–32)
pCO2, Ven: 48 mmHg (ref 44–60)
pH, Ven: 7.354 (ref 7.25–7.43)
pO2, Ven: 46 mmHg — ABNORMAL HIGH (ref 32–45)

## 2021-12-12 LAB — COMPREHENSIVE METABOLIC PANEL
ALT: 19 U/L (ref 0–44)
AST: 44 U/L — ABNORMAL HIGH (ref 15–41)
Albumin: 3.9 g/dL (ref 3.5–5.0)
Alkaline Phosphatase: 69 U/L (ref 38–126)
Anion gap: 7 (ref 5–15)
BUN: 7 mg/dL (ref 6–20)
CO2: 26 mmol/L (ref 22–32)
Calcium: 8.8 mg/dL — ABNORMAL LOW (ref 8.9–10.3)
Chloride: 105 mmol/L (ref 98–111)
Creatinine, Ser: 0.92 mg/dL (ref 0.61–1.24)
GFR, Estimated: >60 mL/min (ref >60)
Glucose, Bld: 86 mg/dL (ref 70–99)
Potassium: 2.9 mmol/L — ABNORMAL LOW (ref 3.5–5.1)
Sodium: 138 mmol/L (ref 135–145)
Total Bilirubin: 0.3 mg/dL (ref 0.3–1.2)
Total Protein: 6.8 g/dL (ref 6.5–8.1)

## 2021-12-12 LAB — CBG MONITORING, ED
Glucose-Capillary: 29 mg/dL — CL (ref 70–99)
Glucose-Capillary: 58 mg/dL — ABNORMAL LOW (ref 70–99)
Glucose-Capillary: 61 mg/dL — ABNORMAL LOW (ref 70–99)
Glucose-Capillary: 78 mg/dL (ref 70–99)
Glucose-Capillary: 60 mg/dL — ABNORMAL LOW (ref 70–99)
Glucose-Capillary: 61 mg/dL — ABNORMAL LOW (ref 70–99)
Glucose-Capillary: 63 mg/dL — ABNORMAL LOW (ref 70–99)

## 2021-12-12 LAB — BASIC METABOLIC PANEL
Anion gap: 8 (ref 5–15)
BUN: 6 mg/dL (ref 6–20)
CO2: 24 mmol/L (ref 22–32)
Calcium: 8.6 mg/dL — ABNORMAL LOW (ref 8.9–10.3)
Chloride: 105 mmol/L (ref 98–111)
Creatinine, Ser: 1 mg/dL (ref 0.61–1.24)
GFR, Estimated: >60 mL/min (ref >60)
Glucose, Bld: 109 mg/dL — ABNORMAL HIGH (ref 70–99)
Potassium: 4 mmol/L (ref 3.5–5.1)
Sodium: 137 mmol/L (ref 135–145)

## 2021-12-12 LAB — RESP PANEL BY RT-PCR (FLU A&B, COVID) ARPGX2
Influenza A by PCR: NEGATIVE
Influenza B by PCR: NEGATIVE
SARS Coronavirus 2 by RT PCR: NEGATIVE

## 2021-12-12 LAB — CBC
HCT: 39.2 % (ref 39.0–52.0)
Hemoglobin: 13 g/dL (ref 13.0–17.0)
MCH: 31.7 pg (ref 26.0–34.0)
MCHC: 33.2 g/dL (ref 30.0–36.0)
MCV: 95.6 fL (ref 80.0–100.0)
Platelets: 320 10*3/uL (ref 150–400)
RBC: 4.1 MIL/uL — ABNORMAL LOW (ref 4.22–5.81)
RDW: 13 % (ref 11.5–15.5)
WBC: 11.5 10*3/uL — ABNORMAL HIGH (ref 4.0–10.5)
nRBC: 0 % (ref 0.0–0.2)

## 2021-12-12 LAB — RAPID URINE DRUG SCREEN, HOSP PERFORMED
Amphetamines: POSITIVE — AB
Barbiturates: NOT DETECTED
Benzodiazepines: NOT DETECTED
Cocaine: NOT DETECTED
Opiates: NOT DETECTED
Tetrahydrocannabinol: POSITIVE — AB

## 2021-12-12 LAB — TSH: TSH: 9.978 u[IU]/mL — ABNORMAL HIGH (ref 0.350–4.500)

## 2021-12-12 LAB — ACETAMINOPHEN LEVEL: Acetaminophen (Tylenol), Serum: <10 ug/mL — ABNORMAL LOW (ref 10–30)

## 2021-12-12 LAB — SALICYLATE LEVEL: Salicylate Lvl: <7 mg/dL — ABNORMAL LOW (ref 7.0–30.0)

## 2021-12-12 LAB — GLUCOSE, CAPILLARY
Glucose-Capillary: 173 mg/dL — ABNORMAL HIGH (ref 70–99)
Glucose-Capillary: 185 mg/dL — ABNORMAL HIGH (ref 70–99)
Glucose-Capillary: 121 mg/dL — ABNORMAL HIGH (ref 70–99)
Glucose-Capillary: 137 mg/dL — ABNORMAL HIGH (ref 70–99)
Glucose-Capillary: 143 mg/dL — ABNORMAL HIGH (ref 70–99)

## 2021-12-12 LAB — ETHANOL: Alcohol, Ethyl (B): <10 mg/dL (ref <10)

## 2021-12-12 LAB — PROTIME-INR
INR: 0.9 (ref 0.8–1.2)
Prothrombin Time: 12.5 seconds (ref 11.4–15.2)

## 2021-12-12 LAB — LACTIC ACID, PLASMA: Lactic Acid, Venous: 1.2 mmol/L (ref 0.5–1.9)

## 2021-12-12 LAB — CK: Total CK: 1007 U/L — ABNORMAL HIGH (ref 49–397)

## 2021-12-12 MED ORDER — POTASSIUM CHLORIDE 10 MEQ/100ML IV SOLN
10.0000 meq | INTRAVENOUS | Status: DC
  Administered 2021-12-12: 10 meq via INTRAVENOUS
  Filled 2021-12-12 (×2): qty 100

## 2021-12-12 MED ORDER — INSULIN GLARGINE-YFGN 100 UNIT/ML ~~LOC~~ SOLN
15.0000 [IU] | Freq: Two times a day (BID) | SUBCUTANEOUS | Status: DC
  Administered 2021-12-12 – 2021-12-13 (×3): 15 [IU] via SUBCUTANEOUS
  Filled 2021-12-12 (×4): qty 0.15

## 2021-12-12 MED ORDER — LEVOTHYROXINE SODIUM 50 MCG PO TABS
50.0000 ug | ORAL_TABLET | Freq: Every day | ORAL | Status: DC
  Administered 2021-12-13: 50 ug via ORAL
  Filled 2021-12-12: qty 1

## 2021-12-12 MED ORDER — DEXTROSE 50 % IV SOLN
1.0000 | Freq: Once | INTRAVENOUS | Status: AC
  Administered 2021-12-12: 50 mL via INTRAVENOUS
  Filled 2021-12-12: qty 50

## 2021-12-12 MED ORDER — INSULIN ASPART 100 UNIT/ML IJ SOLN
0.0000 [IU] | Freq: Three times a day (TID) | INTRAMUSCULAR | Status: DC
  Administered 2021-12-12: 1 [IU] via SUBCUTANEOUS

## 2021-12-12 MED ORDER — POLYETHYLENE GLYCOL 3350 17 G PO PACK: 17.0000 g | PACK | Freq: Every day | ORAL | Status: DC | PRN

## 2021-12-12 MED ORDER — DOCUSATE SODIUM 100 MG PO CAPS: 100.0000 mg | ORAL_CAPSULE | Freq: Two times a day (BID) | ORAL | Status: DC | PRN

## 2021-12-12 MED ORDER — GLUCAGON HCL RDNA (DIAGNOSTIC) 1 MG IJ SOLR: 1.0000 mg | Freq: Once | INTRAMUSCULAR | Status: DC

## 2021-12-12 MED ORDER — LORAZEPAM 2 MG/ML IJ SOLN: 0.5000 mg | Freq: Once | INTRAMUSCULAR | Status: DC | PRN

## 2021-12-12 MED ORDER — HYDROCORTISONE 0.5 % EX CREA
TOPICAL_CREAM | Freq: Two times a day (BID) | CUTANEOUS | Status: DC
  Filled 2021-12-12: qty 28.35

## 2021-12-12 MED ORDER — DEXTROSE 20 % IV SOLN
INTRAVENOUS | Status: DC
  Filled 2021-12-12 (×3): qty 500

## 2021-12-12 MED ORDER — POTASSIUM CHLORIDE CRYS ER 20 MEQ PO TBCR
40.0000 meq | EXTENDED_RELEASE_TABLET | Freq: Once | ORAL | Status: AC
  Administered 2021-12-12: 40 meq via ORAL
  Filled 2021-12-12: qty 2

## 2021-12-12 MED ORDER — POTASSIUM CHLORIDE CRYS ER 20 MEQ PO TBCR
40.0000 meq | EXTENDED_RELEASE_TABLET | Freq: Once | ORAL | Status: DC
  Filled 2021-12-12: qty 2

## 2021-12-12 MED ORDER — DEXTROSE 50 % IV SOLN
1.0000 | Freq: Once | INTRAVENOUS | Status: AC
Start: 2021-12-12 — End: 2021-12-12
  Administered 2021-12-12: 50 mL via INTRAVENOUS
  Filled 2021-12-12: qty 50

## 2021-12-12 MED ORDER — INSULIN ASPART 100 UNIT/ML IJ SOLN: 0.0000 [IU] | INTRAMUSCULAR | Status: DC | PRN

## 2021-12-12 MED ORDER — CHLORHEXIDINE GLUCONATE CLOTH 2 % EX PADS: 6.0000 | MEDICATED_PAD | Freq: Every day | CUTANEOUS | Status: DC

## 2021-12-12 MED ORDER — HEPARIN SODIUM (PORCINE) 5000 UNIT/ML IJ SOLN
5000.0000 [IU] | Freq: Three times a day (TID) | INTRAMUSCULAR | Status: DC
  Administered 2021-12-12 – 2021-12-13 (×2): 5000 [IU] via SUBCUTANEOUS
  Filled 2021-12-12 (×2): qty 1

## 2021-12-12 MED ORDER — LORAZEPAM 2 MG/ML IJ SOLN
2.0000 mg | Freq: Once | INTRAMUSCULAR | Status: AC
  Administered 2021-12-12: 2 mg via INTRAVENOUS
  Filled 2021-12-12: qty 1

## 2021-12-12 NOTE — ED Notes (Addendum)
Pt c/o pain to IV site in L hand. Some swelling noted. Pain with flushing IV, therefore removed by RN. Pharmacy contacted and advised hyaluronidase could be used to relieve pain. MD will be notified.   Pharmacy re-reached out to me and stated cold compresses and hydrocortisone cream would suffice. PA Rahul Celine Mans contacted.

## 2021-12-12 NOTE — Progress Notes (Signed)
NAME:  Mark [name], MRN:  [id number], DOB:  August 15, 1986, LOS: 0 ADMISSION DATE:  12/11/2021, CONSULTATION DATE:  12/11/2021 REFERRING MD: Pilar Plate CHIEF COMPLAINT:  AMS   History of Present Illness:  Mark [name] is a 36 y.o. male who has a PMH as below including but not limited to DM.  He presented to [facility] ED 2/20 with AMS and possible seizures (does have hx of such).  In field, noted to be hypoglycemic to 40's.  He was given D50 and transported to ED where he received additional dextrose and was started on D10 infusion.  Despite these interventions, he remained hypoglycemic and was ultimately started on D20; hence, PCCM called for ICU admission.   He informs me that he has not had an appetite for 3 - 4 days, just felt like not eating much.  He continued to take his normal insulin dose during this time.  Denies any fevers/chills/sweats, headache, chest pain, dyspnea, N/V/D, abd pain, myalgias, exposures to sick contacts, recent travel. Has had increased fatigue and has been trying to "sleep it off".  Besides fatigue and decreased appetite, has felt his normal self.  Pertinent  Medical History  Hyponatremia; Severe recurrent major depression with psychotic features [facility]); MDD (major depressive disorder); Obstructive hydrocephalus (HCC); Truncal ataxia; Personality disorder in adult [facility]); Dysthymia; Adjustment disorder with depressed mood; Suicidal ideation; Bipolar I disorder, most recent episode depressed, severe without psychotic features (HCC); Homelessness; Marijuana abuse; Tobacco dependence; Seizure disorder (HCC); Hypothyroidism; Diabetic polyneuropathy associated with type 1 diabetes mellitus (HCC); Drug overdose, intentional (HCC); Insulin overdose; Drug abuse (HCC); Attempted suicide [facility]); MDD (major depressive disorder), recurrent episode, severe (HCC); Bipolar I disorder, current or most recent episode depressed, in partial remission (HCC); Seizures (HCC); Bipolar I disorder (HCC);  Uncontrolled type 1 diabetes mellitus with hyperglycemia (HCC); Insomnia due to other mental disorder; Right leg DVT (HCC); History of DVT of lower extremity; Bipolar affective disorder, current episode mixed (HCC); Generalized anxiety disorder; Hydrocephalus (HCC); Hyperglycemia; and Hypoglycemia on their problem list.  Significant Hospital Events: Including procedures, antibiotic start and stop dates in addition to other pertinent events   2/21 > admit  Interim History / Subjective:  Pain to IV site in L hand , IV removed. Cold compresses and hydrocortisone cream given for pain. Patient upset at RN signoff this morning, see RN note.  Patient calm and cooperative on exam. He is not taking psychiatric medications outpatient and would not like to start them inpatient. Reports using amphetamines this past week.  Denies headache or recent fall due to imbalance.   Objective   Blood pressure (!) 140/93, pulse 82, temperature 97.8 F (36.6 C), resp. rate 14, SpO2 100 %.       No intake or output data in the 24 hours ending 12/12/21 0747 There were no vitals filed for this visit.  Examination: General: NAD, nl appearance HE: Normocephalic, atraumatic , EOMI, Conjunctivae normal ENT: No congestion, no rhinorrhea, no exudate or erythema  Cardiovascular: Normal rate, regular rhythm.  No murmurs, rubs, or gallops Pulmonary : Effort normal, breath sounds normal. No wheezes, rales, or rhonchi Abdominal: soft, nontender,  bowel sounds present Musculoskeletal: pain with palpation over thoracic spine Skin: erythema over thoracic spine and back  Psychiatric/Behavioral:  flat affect, normal behavior  Neuro: Alert and oriented x4  . No focal deficits.  K 2.9 Resolved Hospital Problem list   AMS  Assessment & Plan:  Persistent hypoglycemia in Type 1 Diabetic, in setting of ongoing insulin use  with minimal PO intake for 3 -4 days  - home regimen Insulin regimen , 70/30 35 units TID. UDS positive for  amphetamines, possible cause of decrease appetite.  - Now taking PO intake and hypoglycemia resolved, last CBG's 173 and 188 - Stop D20 Infusion - Q4 CBG for now - SSI-S - Start Lantus 15 units BID  Lytic Lesion are of C7 on CT Cervical Spine  - MRI Cervical Spine W/WO contrast  Hypokalemia - K 2.9, repletion ordered. - Afternoon BMP  Mild rhabdomyolysis  - Continue PO intake for rhabdomyolysis  - CK tomorrow morning  Polysubstance use disorder - UDS positive for amphetamines and THC - Counseled patient on substance use  Bipolar I disorder, severe Insomnia GAD - Followed at [facility] - Home regimen: Lexapro 20 mg daily, Wellbutrin 300 mg daily , Seroquel 300 mg at bedtime - Patient has not been taking at home and not interested in restarting medications at this time.  - Follow up with his psychiatrist  Hypothyroidism - TSH 9.9. He has not been taking levothyroxine.  - restart levothyroxine 50 mcg  Hx Hydrocephalus with VP shunt - follow at [facility], appears they were trying to schedule for MRI in the middle of last year for persistent hydrocephalus on CT - Follow up with Neurology  Epilepsy - History of admission and reporting he is not taking home antiepileptics. At this time he would not like to restart medications.      Best Practice (right click and "Reselect all SmartList Selections" daily)   Diet/type: Regular consistency (see orders) DVT prophylaxis: prophylactic heparin  GI prophylaxis: N/A Lines: N/A Foley:  N/A Code Status:  full code Last date of multidisciplinary goals of care discussion [x]    , MD PGY3 Internal Medicine [phone]

## 2021-12-12 NOTE — TOC Initial Note (Signed)
Transition of Care [facility]) - Initial/Assessment Note    Patient Details  Name: Mark [name] MRN: [id number] Date of Birth: [birth year]  Transition of Care [facility]) CM/SW Contact:    Glennon Mac, RN Phone Number: 12/12/2021, 11:15am  Clinical Narrative:                 Pt admitted on 12/11/2021 with AMS, poss seizure, and severe hypogycemia.  PTA, pt independent and living with a roommate, per report.  Spoke with patient regarding medication affordability; he states he has no insurance, and buys insulin over the counter.  Able to look up patient's insurance information from admitting, and it appears his Medicaid is active.  Explained this to patient, and he became angry, stating that I "pissed him off."  Per Diabetic Coordinator's notes, patient does not have a blood glucose meter or supplies, and has not followed up with PCP in about a year.  Patient would not continue conversation regarding med costs and PCP f/u.  As per Diabetic Coordinator's notes, patient needs Rx for glucometer and testing supplies; would recommend sending discharge Rx to [facility], to ensure that patient gets Rx filled.  Medicaid PCP listed is [facility]; will arrange follow up appt.   Expected Discharge Plan: Home/Self Care Barriers to Discharge: Continued Medical Work up           Expected Discharge Plan and Services Expected Discharge Plan: Home/Self Care   Discharge Planning Services: CM Consult, Medication Assistance, Indigent Health Clinic, Follow-up appt scheduled   Living arrangements for the past 2 months: Single Family Home                                      Prior Living Arrangements/Services Living arrangements for the past 2 months: Single Family Home Lives with:: Roommate Patient language and need for interpreter reviewed:: Yes        Need for Family Participation in Patient Care: Yes (Comment) Care giver support system in place?: No (comment)    Criminal Activity/Legal Involvement Pertinent to Current Situation/Hospitalization: No - Comment as needed  Activities of Daily Living Home Assistive Devices/Equipment: None                     Emotional Assessment   Attitude/Demeanor/Rapport: Hostile Affect (typically observed): Guarded Orientation: : Oriented to Self, Oriented to Place, Oriented to  Time, Oriented to Situation      Admission diagnosis:  Hypoglycemia [E16.2] Patient Active Problem List   Diagnosis Date Noted   Hypoglycemia 12/12/2021   Hypokalemia    Non-traumatic rhabdomyolysis    Hyperglycemia 03/21/2021   Hydrocephalus (HCC) 03/02/2021   Bipolar affective disorder, current episode mixed (HCC) 02/24/2021   Generalized anxiety disorder 02/24/2021   History of DVT of lower extremity 12/07/2020   Right leg DVT (HCC) 11/03/2020   Insomnia due to other mental disorder    Bipolar I disorder (HCC)    Uncontrolled type 1 diabetes mellitus with hyperglycemia (HCC)    Seizures (HCC)    Bipolar I disorder, current or most recent episode depressed, in partial remission (HCC)    MDD (major depressive disorder), recurrent episode, severe (HCC) 03/13/2020   Drug overdose, intentional (HCC) 03/05/2020   Drug abuse (HCC) 03/05/2020   Attempted suicide (HCC) 03/05/2020   Insulin overdose    Hypothyroidism 02/02/2020   Diabetic polyneuropathy associated with type 1  diabetes mellitus (HCC) 02/02/2020   Homelessness 12/29/2019   Marijuana abuse 12/29/2019   Tobacco dependence 12/29/2019   Acute encephalopathy 12/29/2019   Suicidal ideation    Bipolar I disorder, most recent episode depressed, severe without psychotic features (HCC)    Adjustment disorder with depressed mood 12/07/2017   Dysthymia 10/08/2017   Personality disorder in adult [facility]) 09/26/2017   Truncal ataxia 09/20/2017   Obstructive hydrocephalus (HCC) 09/19/2017   MDD (major depressive disorder) 08/22/2017   Severe recurrent major depression  with psychotic features (HCC) 08/19/2017   Hyponatremia 08/18/2017   PCP:  Rema Fendt, NP Pharmacy:   [facility] [address], [address] [place] [address] Phone: [phone] Fax: [phone]  [name] [address] [place] [address] Phone: [phone] Fax: [phone]     Social Determinants of Health (SDOH) Interventions    Readmission Risk Interventions Readmission Risk Prevention Plan 03/25/2020  Transportation Screening Complete  Medication Review (RN Care Manager) Complete  PCP or Specialist appointment within 3-5 days of discharge Complete  HRI or Home Care Consult (No Data)  SW Recovery Care/Counseling Consult Complete  Palliative Care Screening Not Applicable  Skilled Nursing Facility Not Applicable  Some recent data might be hidden    Quintella Baton, RN, BSN  Trauma/Neuro ICU Case Manager [phone]

## 2021-12-12 NOTE — H&P (Signed)
NAME:  Mark [name], MRN:  [id number], DOB:  05-Jul-1986, LOS: 0 ADMISSION DATE:  12/11/2021, CONSULTATION DATE:  12/12/20 REFERRING MD:  Sedonia Small CHIEF COMPLAINT:  AMS   History of Present Illness:  Mark [name] is a 36 y.o. male who has a PMH as below including but not limited to DM.  He presented to [facility] ED 2/20 with AMS and possible seizures (does have hx of such).  In field, noted to be hypoglycemic to 40's.  He was given D50 and transported to ED where he received additional dextrose and was started on D10 infusion.  Despite these interventions, he remained hypoglycemic and was ultimately started on D20; hence, PCCM called for ICU admission.  He informs me that he has not had an appetite for 3 - 4 days, just felt like not eating much.  He continued to take his normal insulin dose during this time.  Denies any fevers/chills/sweats, headache, chest pain, dyspnea, N/V/D, abd pain, myalgias, exposures to sick contacts, recent travel. Has had increased fatigue and has been trying to "sleep it off".  Besides fatigue and decreased appetite, has felt his normal self.  Pertinent  Medical History:  has Hyponatremia; Severe recurrent major depression with psychotic features [facility]); MDD (major depressive disorder); Obstructive hydrocephalus ([place]); Truncal ataxia; Personality disorder in adult [facility]); Dysthymia; Adjustment disorder with depressed mood; Suicidal ideation; Bipolar I disorder, most recent episode depressed, severe without psychotic features ([place]); Homelessness; Marijuana abuse; Tobacco dependence; Seizure disorder ([place]); Hypothyroidism; Diabetic polyneuropathy associated with type 1 diabetes mellitus ([place]); Drug overdose, intentional ([place]); Insulin overdose; Drug abuse ([place]); Attempted suicide [facility]); MDD (major depressive disorder), recurrent episode, severe ([place]); Bipolar I disorder, current or most recent episode depressed, in partial remission ([place]); Seizures ([place]); Bipolar I disorder ([place]);  Uncontrolled type 1 diabetes mellitus with hyperglycemia ([place]); Insomnia due to other mental disorder; Right leg DVT ([place]); History of DVT of lower extremity; Bipolar affective disorder, current episode mixed ([place]); Generalized anxiety disorder; Hydrocephalus ([place]); Hyperglycemia; and Hypoglycemia on their problem list.  Significant Hospital Events: Including procedures, antibiotic start and stop dates in addition to other pertinent events   2/21 > admit.  Interim History / Subjective:  Awake, interactive. D20 just started.  Last CBG 63.  Objective:  Blood pressure (!) 140/93, pulse 82, temperature 97.8 F (36.6 C), resp. rate 14, SpO2 100 %.       No intake or output data in the 24 hours ending 12/12/21 0626 There were no vitals filed for this visit.  Examination: General: Adult male, resting in bed, in NAD. Neuro: A&O x 3, no deficits. HEENT: [place]/AT. Sclerae anicteric. EOMI. Cardiovascular: RRR, no M/R/G.  Lungs: Respirations even and unlabored.  CTA bilaterally, No W/R/R. Abdomen: BS x 4, soft, NT/ND.  Musculoskeletal: No gross deformities, no edema.  Skin: Intact, warm, no rashes.  Labs/imaging personally reviewed:  CT head / neck 2/21 > no acute process.  Known persistent hydrocephalus c/w aqueductal stenosis.  Enlarged lytic area within inferior aspect of T7.  Assessment & Plan:   Persistent hypoglycemia - presumed 2/2 ongoing insulin use in setting minimal PO intake with no appetite x 3 - 4 days. Hx DM. - Continue D20 infusion with frequent CBG checks. - Transition off as CBG improves, anticipate improvement as long acting insulin wears off. - Allow and push diet. - Hold home insulin.  Hypokalemia. - 74mq K x 2 now then follow as should start to correct/improve.  Mild rhabdo. - Continue D20 for now then  transition to LR. - Trend CK.  Polysubstance abuse - UDS positive for amphetamines and THC. - Cessation counseling.  Hx anxiety, bipolar depression, diabetic  polyneurotpathy, hydrocephalus, seizures, medication noncompliance. - Per pharmacy med rec, pt not taking most of his listed meds including Bupropion, Escitalopram, Gabapentin, Keppra, Quetiapine. - Discuss above meds further with pt and resume gradually. - Needs close outpatient follow up.  Hx hypothyroidism (on Levothyroxine but reportedly not taking). - Add on TSH. - Discuss medication compliance with pt and needs outpatient follow up.  ? Lytic area of T7 noted on CT neck. - Consider MRI once over acute issues.  Best practice (evaluated daily):  Diet/type: Regular consistency (see orders) DVT prophylaxis: prophylactic heparin  GI prophylaxis: N/A Lines: N/A Foley:  N/A Code Status:  full code Last date of multidisciplinary goals of care discussion: None yet.  Labs   CBC: Recent Labs  Lab 12/12/21 0023 12/12/21 0033  WBC 11.5*  --   HGB 13.0 13.6  HCT 39.2 40.0  MCV 95.6  --   PLT 320  --     Basic Metabolic Panel: Recent Labs  Lab 12/12/21 0023 12/12/21 0033  NA 138 141  K 2.9* 2.9*  CL 105  --   CO2 26  --   GLUCOSE 86  --   BUN 7  --   CREATININE 0.92  --   CALCIUM 8.8*  --    GFR: CrCl cannot be calculated (Unknown ideal weight.). Recent Labs  Lab 12/12/21 0023  WBC 11.5*  LATICACIDVEN 1.2    Liver Function Tests: Recent Labs  Lab 12/12/21 0023  AST 44*  ALT 19  ALKPHOS 69  BILITOT 0.3  PROT 6.8  ALBUMIN 3.9   No results for input(s): LIPASE, AMYLASE in the last 168 hours. No results for input(s): AMMONIA in the last 168 hours.  ABG    Component Value Date/Time   PHART 7.450 09/03/2020 0736   PCO2ART 37.1 09/03/2020 0736   PO2ART 86 09/03/2020 0736   HCO3 26.8 12/12/2021 0033   TCO2 28 12/12/2021 0033   ACIDBASEDEF 3.1 (H) 03/21/2021 2150   O2SAT 79 12/12/2021 0033     Coagulation Profile: Recent Labs  Lab 12/12/21 0023  INR 0.9    Cardiac Enzymes: Recent Labs  Lab 12/12/21 0023  CKTOTAL 1,007*     HbA1C: Hemoglobin A1C  Date/Time Value Ref Range Status  01/16/2021 03:05 PM 8.0 (A) 4.0 - 5.6 % Final  12/05/2020 03:12 PM 8.3 (A) 4.0 - 5.6 % Final  10/13/2013 03:46 AM 9.4 (H) 4.2 - 6.3 % Final    Comment:    The American Diabetes Association recommends that a primary goal of therapy should be <7% and that physicians should reevaluate the treatment regimen in patients with HbA1c values consistently >8%.   10/08/2013 03:01 PM 9.7 (H) 4.2 - 6.3 % Final    Comment:    The American Diabetes Association recommends that a primary goal of therapy should be <7% and that physicians should reevaluate the treatment regimen in patients with HbA1c values consistently >8%.    Hgb A1c MFr Bld  Date/Time Value Ref Range Status  03/21/2021 11:58 PM 8.4 (H) 4.8 - 5.6 % Final    Comment:    (NOTE)         Prediabetes: 5.7 - 6.4         Diabetes: >6.4         Glycemic control for adults with diabetes: <7.0   09/15/2020  11:16 AM 7.5 (H) 4.8 - 5.6 % Final    Comment:    (NOTE) Pre diabetes:          5.7%-6.4%  Diabetes:              >6.4%  Glycemic control for   <7.0% adults with diabetes     CBG: Recent Labs  Lab 12/12/21 0158 12/12/21 0306 12/12/21 0403 12/12/21 0501 12/12/21 0603  GLUCAP 61* 78 60* 61* 63*    Review of Systems:   All negative; except for those that are bolded, which indicate positives.  Constitutional: weight loss, weight gain, night sweats, fevers, chills, fatigue, weakness.  HEENT: headaches, sore throat, sneezing, nasal congestion, post nasal drip, difficulty swallowing, tooth/dental problems, visual complaints, visual changes, ear aches. Neuro: difficulty with speech, weakness, numbness, ataxia. CV:  chest pain, orthopnea, PND, swelling in lower extremities, dizziness, palpitations, syncope.  Resp: cough, hemoptysis, dyspnea, wheezing. GI: heartburn, indigestion, abdominal pain, nausea, vomiting, diarrhea, constipation, change in bowel habits, loss  of appetite, hematemesis, melena, hematochezia.  GU: dysuria, change in color of urine, urgency or frequency, flank pain, hematuria. MSK: joint pain or swelling, decreased range of motion. Psych: change in mood or affect, depression, anxiety, suicidal ideations, homicidal ideations. Skin: rash, itching, bruising.   Past Medical History:  He,  has a past medical history of Anxiety, Bipolar disorder ([place]), Depression, Diabetes mellitus without complication ([place]), Diabetic polyneuropathy associated with type 1 diabetes mellitus ([place]), Hydrocephalus ([place]), Kidney stones, Seizures ([place]), and Truncal ataxia.   Surgical History:   Past Surgical History:  Procedure Laterality Date   BRAIN SURGERY     hydrocephalus   KIDNEY STONE SURGERY       Social History:   reports that he has been smoking cigarettes. He has a 20.00 pack-year smoking history. He has never used smokeless tobacco. He reports current alcohol use. He reports current drug use. Frequency: 7.00 times per week. Drug: Marijuana.   Family History:  His family history includes Breast cancer in his mother; Diabetes in his paternal aunt; Other in his father.   Allergies Allergies  Allergen Reactions   Mushroom Extract Complex Hives, Itching and Nausea And Vomiting   Penicillins Anaphylaxis, Hives and Swelling    Has patient had a PCN reaction causing immediate rash, facial/tongue/throat swelling, SOB or lightheadedness with hypotension: Yes Has patient had a PCN reaction causing severe rash involving mucus membranes or skin necrosis: No Has patient had a PCN reaction that required hospitalization: Yes Has patient had a PCN reaction occurring within the last 10 years: Yes If all of the above answers are "NO", then may proceed with Cephalosporin use. Tolerated ceftriaxone 03/24/20   Sulfa Antibiotics Anaphylaxis and Hives   Clindamycin/Lincomycin Hives     Home Medications  Prior to Admission medications   Medication Sig Start  Date End Date Taking? Authorizing Provider  acetaminophen (TYLENOL) 325 MG tablet Take 2 tablets (650 mg total) by mouth every 6 (six) hours as needed for mild pain, fever or headache. 09/30/20  Yes Samella Parr, NP  insulin NPH-regular Human (NOVOLIN 70/30) (70-30) 100 UNIT/ML injection INJECT 3 [place] 45-35-25 Patient taking differently: Inject 35 Units into the skin 3 (three) times daily. 03/24/21  Yes Nicolette Bang, MD  Insulin Syringe-Needle U-100 30G X 1/2" 0.5 ML MISC USE AS DIRECTED 03/22/21  Yes Aline August, MD  Blood Glucose Monitoring Suppl (ACCU-CHEK GUIDE ME) w/Device KIT USE AS DIRECTED Patient not taking:  Reported on 12/12/2021 03/24/21   Nicolette Bang, MD  buPROPion (WELLBUTRIN XL) 150 MG 24 hr tablet Take 1 tablet (150 mg total) by mouth every morning. Patient to take 1 tablet (150 mg total) for 6 days then 2 tablets (300 mg total)  daily Patient not taking: Reported on 12/12/2021 08/08/21 08/08/22  Ileene Musa E, PA  escitalopram (LEXAPRO) 20 MG tablet Take 1 tablet (20 mg total) by mouth daily. Patient not taking: Reported on 12/12/2021 08/08/21 09/07/21  Malachy Mood, PA  gabapentin (NEURONTIN) 100 MG capsule Take 1 capsule (100 mg total) by mouth 2 (two) times daily. Patient not taking: Reported on 12/12/2021 03/22/21   Aline August, MD  glucose blood (ACCU-CHEK GUIDE) test strip USE AS DIRECTED Patient not taking: Reported on 12/12/2021 03/24/21   Nicolette Bang, MD  Lancets MISC USE AS DIRECTED Patient not taking: Reported on 12/12/2021 03/24/21   Nicolette Bang, MD  levETIRAcetam (KEPPRA) 500 MG tablet Take 1 tablet (500 mg total) by mouth 2 (two) times daily. Patient not taking: Reported on 12/12/2021 03/24/21   Nicolette Bang, MD  levothyroxine (SYNTHROID) 50 MCG tablet Take 1 tablet (50 mcg total) by mouth daily at 6 (six) AM. Patient not taking: Reported on 12/12/2021 03/24/21   Nicolette Bang, MD  ondansetron (ZOFRAN ODT) 8 MG disintegrating tablet Take 1 tablet (8 mg total) by mouth every 8 (eight) hours as needed for nausea or vomiting. Patient not taking: Reported on 12/12/2021 03/19/21   Dorie Rank, MD  pyridOXINE (B-6) 50 MG tablet Take 1 tablet (50 mg total) by mouth daily. Patient not taking: Reported on 12/12/2021 03/22/21   Aline August, MD  QUEtiapine (SEROQUEL) 400 MG tablet Take 1 tablet (400 mg total) by mouth at bedtime. Patient not taking: Reported on 12/12/2021 08/08/21 09/07/21  Malachy Mood, PA     Critical care time: 35 min.   Montey Hora, [facility] For pager details, please see AMION or use Epic chat  After 1900, please call [facility] for cross coverage needs 12/12/2021, 6:26 AM

## 2021-12-12 NOTE — Progress Notes (Addendum)
Inpatient Diabetes Program Recommendations  AACE/ADA: New Consensus Statement on Inpatient Glycemic Control   Target Ranges:  Prepandial:   less than 140 mg/dL      Peak postprandial:   less than 180 mg/dL (1-2 hours)      Critically ill patients:  140 - 180 mg/dL    Latest Reference Range & Units 12/12/21 00:11 12/12/21 01:02 12/12/21 01:58 12/12/21 03:06 12/12/21 04:03 12/12/21 05:01 12/12/21 06:03  Glucose-Capillary 70 - 99 mg/dL 29 (LL) 58 (L) 61 (L) 78 60 (L) 61 (L) 63 (L)    Latest Reference Range & Units 12/11/21 23:12  Glucose-Capillary 70 - 99 mg/dL 15 (LL)   Review of Glycemic Control  Diabetes history: DM1 Outpatient Diabetes medications: 70/30 30 units BID with meals Current orders for Inpatient glycemic control: None; D20 @ 125 ml/hr  Inpatient Diabetes Program Recommendations:    Insulin: Once glucose is stable and no further issues with hypoglycemia, please consider ordering Novolog 0-9 units Q4H. Will need to resume basal insulin once glucose is stable to prevent DKA since patient has Type 1 DM; may want to consider Semglee 15 units BID.  Outpatient DM supplies: Patient reports that he is buying 70/30 over the counter and does not have a glucometer or testing supplies.  Patient has Medicaid.  At time of discharge, please provide Rx for 70/30 (vials), insulin syringes (#[id number]) and glucometer and testing supplies (#[id number]).  NOTE: Noted consult for severe hypoglycemia. Patient has Type 1 DM and uses 70/30 insulin outpatient for DM control. Patient sees Dr. Everardo All (Endocrinologist) and was last seen 02/28/21. Per office note on 02/28/21, patient was changed from 70/30 35 units BID to 70/30 45 units with bk, 35 units with lunch, 25 units with supper. Per H&P, "He informs me that he has not had an appetite for 3 - 4 days, just felt like not eating much.  He continued to take his normal insulin dose during this time."  Noted in pertinent medical hx patient has hx of suicidal  ideation, drug overdose, intentional, insulin overdose, and polysubstance abuse. Will plan to speak with patient today regarding hypoglycemia.  Addendum 12/12/21@10 :55-Spoke with patient at bedside regarding DM. Patient lying in bed with head covered; uncovered his head to name and engaged in conversation. Patient states that he has DM1 and takes Novolin 70/30 30 units BID for DM control. Patient reports that he buys Novolin 70/30 vials over the counter (without prescription). Patient reports that he does not check glucose at home because he does not have a glucometer or testing supplies. Noted Medicaid listed as insurance in chart. Patient states he is not sure if Medicaid is active. Patient states he does not remember when he last took Novolin 70/30 (reports he can not remember if he took 70/30 yesterday or not). Patient confirms that he has not been eating much over the past 3-4 days. Patient states he was still taking insulin even with not eating but notes that sometimes he adjust the dose of insulin he takes if not eating. Not able to verbalize if he took any 70/30 yesterday or not. Patient states that he last seen PCP in June 2022 and Dr. Everardo All in May 2022. Discussed that if he is not eating, his dose of 70/30 likely needs to be adjusted and he needs to discuss with PCP and/or Endocrinologist. Discussed need to check glucose consistently when taking insulin. Informed patient that it would be requested that he receive Rx for glucometer and testing supplies (#[id number]). Patient moaning  during conversation and stated that he was in pain "all over my body".  Informed patient that I would let RN know that he was in pain. Patient reports that he has no other questions or needs at this time related to DM. Sent chat message to Sidney Ace, RN with [facility] to see if Medicaid active and was told that Medicaid is showing as active.  Thanks, [name], RN, MSN, CDE Diabetes Coordinator Inpatient Diabetes  Program [phone] (Team Pager from 8am to 5pm)

## 2021-12-12 NOTE — ED Notes (Signed)
While giving report to 4N receiving nurse, employee notified me that pt was removing all equipment and standing at bedside. Upon entering room pt was visibly upset and cursing at staff. All EKG and IV's removed by pt.

## 2021-12-12 NOTE — ED Notes (Signed)
Pt heard shouting from nurse's station. Upon entering room pt visibly agitated. Attempting to remove all EKG wires and IV's. Pt able to be verbally de-escalated. Provided with coke and graham crackers.

## 2021-12-12 NOTE — ED Notes (Signed)
MD notified of CBG result. Pt A+Ox4, given sugary drink. Will re-assess in one hour.

## 2021-12-12 NOTE — ED Notes (Addendum)
I received a phone call follow up from [place] for an update on pt's condition. Poison control noted that butane should not affect pt's CBG. They did however state that his potassium being 2.9 was to be expected with the hypoglycemia. Advised to monitor unless potassium decreased to <2.5 or pt had EKG abnormalities, and then replete with 10 mEq with IV potassium and re-assess.

## 2021-12-13 ENCOUNTER — Other Ambulatory Visit (HOSPITAL_COMMUNITY): Payer: Self-pay

## 2021-12-13 ENCOUNTER — Encounter (HOSPITAL_COMMUNITY): Payer: Self-pay | Admitting: Pulmonary Disease

## 2021-12-13 ENCOUNTER — Inpatient Hospital Stay (HOSPITAL_COMMUNITY): Payer: Medicaid Other

## 2021-12-13 DIAGNOSIS — E162 Hypoglycemia, unspecified: Secondary | ICD-10-CM

## 2021-12-13 LAB — GLUCOSE, CAPILLARY
Glucose-Capillary: 199 mg/dL — ABNORMAL HIGH (ref 70–99)
Glucose-Capillary: 130 mg/dL — ABNORMAL HIGH (ref 70–99)

## 2021-12-13 MED ORDER — QUETIAPINE FUMARATE 200 MG PO TABS
400.0000 mg | ORAL_TABLET | Freq: Every day | ORAL | 0 refills | Status: DC
  Filled 2021-12-13: qty 60, 30d supply, fill #0

## 2021-12-13 MED ORDER — OXYCODONE HCL 5 MG PO TABS
5.0000 mg | ORAL_TABLET | Freq: Once | ORAL | Status: AC
  Administered 2021-12-13: 5 mg via ORAL
  Filled 2021-12-13: qty 1

## 2021-12-13 MED ORDER — ACCU-CHEK GUIDE W/DEVICE KIT
1.0000 | PACK | Freq: Every day | 0 refills | Status: AC
  Filled 2021-12-13: qty 1, 30d supply, fill #0

## 2021-12-13 MED ORDER — LEVETIRACETAM 500 MG PO TABS
500.0000 mg | ORAL_TABLET | Freq: Two times a day (BID) | ORAL | 0 refills | Status: DC
  Filled 2021-12-13: qty 60, 30d supply, fill #0

## 2021-12-13 MED ORDER — INSULIN PEN NEEDLE 32G X 4 MM MISC
0 refills | Status: DC
  Filled 2021-12-13: qty 100, 25d supply, fill #0

## 2021-12-13 MED ORDER — BUPROPION HCL ER (XL) 150 MG PO TB24
150.0000 mg | ORAL_TABLET | ORAL | 0 refills | Status: DC
  Filled 2021-12-13: qty 30, 30d supply, fill #0

## 2021-12-13 MED ORDER — ACCU-CHEK GUIDE VI STRP
ORAL_STRIP | 0 refills | Status: AC
  Filled 2021-12-13: qty 100, 25d supply, fill #0

## 2021-12-13 MED ORDER — ACCU-CHEK SOFTCLIX LANCETS MISC
Freq: Every day | 0 refills | Status: AC
  Filled 2021-12-13: qty 100, 25d supply, fill #0

## 2021-12-13 MED ORDER — LEVOTHYROXINE SODIUM 50 MCG PO TABS
50.0000 ug | ORAL_TABLET | Freq: Every day | ORAL | 0 refills | Status: DC
Start: 2021-12-14 — End: 2022-01-11
  Filled 2021-12-13: qty 30, 30d supply, fill #0

## 2021-12-13 MED ORDER — ESCITALOPRAM OXALATE 20 MG PO TABS
20.0000 mg | ORAL_TABLET | Freq: Every day | ORAL | 0 refills | Status: DC
  Filled 2021-12-13: qty 30, 30d supply, fill #0

## 2021-12-13 MED ORDER — NOVOLIN 70/30 FLEXPEN (70-30) 100 UNIT/ML ~~LOC~~ SUPN
PEN_INJECTOR | SUBCUTANEOUS | 0 refills | Status: DC
  Filled 2021-12-13: qty 30, 28d supply, fill #0

## 2021-12-13 NOTE — Discharge Summary (Signed)
Physician Discharge Summary         Patient ID: Mark [name] MRN: [id number] DOB/AGE: Mar 12, 1986 36 y.o.  Admit date: 12/11/2021 Discharge date: 12/13/2021  Discharge Diagnoses:    Active Hospital Problems   Diagnosis Date Noted   Hypoglycemia 12/12/2021   Acute encephalopathy 12/29/2019    Resolved Hospital Problems  No resolved problems to display.      Discharge summary   Patient admitted 2/24 for severe hypoglycemia and treated in the ICU for persistent hypoglycemia in setting of insulin administration for type 1 diabetes and minimal p.o. intake for 3 to 4 days. History of recent amphetamine use was likely cause of decreased appetite.  Patient improved with D20 infusion and transition to basal insulin with SSI.  In addition patient had lytic lesion on C7 of cervical spine on CT for initial workup of altered mental status.  We attempted to have MRI cervical spine completed but patient was not cooperating.  At discharge patient has been recommended to follow-up with his  primary care physician, neurologist,psychiatrist and endocrinologist.   Discharge Plan by Active Problems   All medications sent through transitions of care pharmacy so patient can leave with his current regimen.  Type 1 diabetes - Novolin 70/30 KwikPen, continue current dosing of 45 units with breakfast, 35 units with lunch, 25 units with supper.  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  I recommend follow-up with neurology who had ordered studies previously.  Home antiepileptic, Keppra, was prescribed at discharge.  Bipolar 1 disorder, severe Insomnia GAD -Refused reinitiation of psychiatric medications.At discharge medications were prescribed for 1 month and recommended following up with psychiatry.  Significant Hospital tests/ studies   CBC Latest Ref  Rng & Units 12/12/2021 12/12/2021 03/21/2021  WBC 4.0 - 10.5 K/uL - 11.5(H) 9.7  Hemoglobin 13.0 - 17.0 g/dL 13.6 13.0 12.6(L)  Hematocrit 39.0 - 52.0 % 40.0 39.2 36.8(L)  Platelets 150 - 400 K/uL - 320 228   BMP Latest Ref Rng & Units 12/12/2021 12/12/2021 12/12/2021  Glucose 70 - 99 mg/dL 109(H) - 86  BUN 6 - 20 mg/dL 6 - 7  Creatinine 0.61 - 1.24 mg/dL 1.00 - 0.92  Sodium 135 - 145 mmol/L 137 141 138  Potassium 3.5 - 5.1 mmol/L 4.0 2.9(L) 2.9(L)  Chloride 98 - 111 mmol/L 105 - 105  CO2 22 - 32 mmol/L 24 - 26  Calcium 8.9 - 10.3 mg/dL 8.6(L) - 8.8(L)   DG Abd 1 View  Result Date: 12/11/2021 CLINICAL DATA:  Encounter for hypoglycemia, evaluate for shunt malfunction EXAM: ABDOMEN - 1 VIEW COMPARISON:  None. FINDINGS: Lines and tubes overlie the patient. No shunt visualized. The bowel gas pattern is normal. No radio-opaque calculi or other significant radiographic abnormality are seen. IMPRESSION: 1. Lines and tubes overlie the patient. No shunt visualized. 2. Negative. Electronically Signed   By: Iven Finn M.D.   On: 12/11/2021 23:43   CT HEAD WO CONTRAST (5MM)  Result Date: 12/12/2021 CLINICAL DATA:  Facial fracture follow-up. EXAM: CT HEAD WITHOUT CONTRAST TECHNIQUE: Contiguous axial images were obtained from the base of the skull through the vertex without intravenous contrast. RADIATION DOSE REDUCTION: This exam was performed according to the departmental dose-optimization program which includes automated exposure control, adjustment of the mA and/or kV according to patient size and/or use of iterative reconstruction technique. COMPARISON:  September 02, 2020 FINDINGS: Brain: There is persistent marked severity hydrocephalus involving the third ventricle and lateral ventricles. The fourth ventricle remains normal in appearance. There is no evidence of acute infarction, hemorrhage, extra-axial collection or mass lesion/mass effect. Vascular: No hyperdense vessel or unexpected calcification.  Skull: A right frontal burr hole and associated fixation plate and screws are again seen. Sinuses/Orbits: No acute finding. Other: None. IMPRESSION: 1. No acute intracranial abnormality. 2. Persistent marked severity hydrocephalus, as described above, consistent with aqueductal stenosis. Electronically Signed   By: Virgina Norfolk M.D.   On: 12/12/2021 02:24   CT CERVICAL SPINE WO CONTRAST  Result Date: 12/12/2021 CLINICAL DATA:  Follow-up facial fracture. EXAM: CT CERVICAL SPINE WITHOUT CONTRAST TECHNIQUE: Multidetector CT imaging of the cervical spine was performed without intravenous contrast. Multiplanar CT image reconstructions were also generated. RADIATION DOSE REDUCTION: This exam was performed according to the departmental dose-optimization program which includes automated exposure control, adjustment of the mA and/or kV according to patient size and/or use of iterative reconstruction technique. COMPARISON:  December 23, 2017 FINDINGS: Alignment: Normal. Skull base and vertebrae: No acute fracture. A 10 mm x 8 mm round lytic area is seen within the inferior aspect of the T7 vertebral body. This extends to involve the inferior endplate and is faintly seen on the prior study (sagittal reformatted image 30, CT series 6). Soft tissues and spinal canal: No prevertebral fluid or swelling. No visible canal hematoma. Disc levels: Mild endplate sclerosis is seen at the level of C4-C5. Mild anterior osteophyte formation is also seen at the level of C4-C5 and C6-C7. Very mild multilevel intervertebral disc space narrowing is seen. Normal, bilateral multilevel facet joints are noted. Upper chest: Negative. Other: None. IMPRESSION: 1. No acute fracture or subluxation of the cervical spine. 2. Mild degenerative changes at the level of C4-C5 and C6-C7. 3. Enlarging lytic area within the inferior aspect of the T7 vertebral body, as described above. MRI correlation is recommended. Electronically Signed   By: Virgina Norfolk M.D.   On: 12/12/2021 02:31   DG Chest Port 1 View  Result Date: 12/11/2021 CLINICAL DATA:  Hypoglycemia. EXAM: PORTABLE CHEST 1 VIEW COMPARISON:  Portable chest 10/03/2019 FINDINGS: The heart size and mediastinal contours are within normal limits. Both lungs are clear. The visualized skeletal structures are unremarkable. IMPRESSION: No active disease.  Stable chest. Electronically Signed   By: Telford Nab M.D.   On: 12/11/2021 23:41    Procedures    Culture data/antimicrobials      Consults   Subjective : Patient cooperative. Reports he is back to baseline. Continues to have some back pain but improved since yesterday. He can not explain why he was so agitated yesterday when taken for MRI.     Discharge Exam: BP (!) 135/94 (BP Location: Right Arm)    Pulse 70    Temp 98.3 F (36.8 C) (Oral)    Resp 19    Ht 6' 2"  (1.88 m)    Wt 85.5 kg    SpO2 99%    BMI 24.20 kg/m   General: Sleeping under covers on approach, cooperative on exam and no acute distress HENT: Normocephalic atraumatic conjunctivae nl PULM: Effort normal, breath sounds normal, no wheezes CV: Regular rate and rhythm no murmurs GI: Soft, nontender, bowel sounds present MSK: Pain with palpation over thoracic spine Skin:  Over thoracic spine, blanchable  Psychiatric/behavioral: Flat affect, mood irritable Neuro: Alert and oriented x4.  Moving all 4 extremities.  No gross focal deficit  Labs at discharge   Lab Results  Component Value Date   CREATININE 1.00 12/12/2021   BUN 6 12/12/2021   NA 137 12/12/2021   K 4.0 12/12/2021   CL 105 12/12/2021   CO2 24 12/12/2021   Lab Results  Component Value Date   WBC 11.5 (H) 12/12/2021   HGB 13.6 12/12/2021   HCT 40.0 12/12/2021   MCV 95.6 12/12/2021   PLT 320 12/12/2021   Lab Results  Component Value Date   ALT 19 12/12/2021   AST 44 (H) 12/12/2021   ALKPHOS 69 12/12/2021   BILITOT 0.3 12/12/2021   Lab Results  Component Value Date   INR 0.9  12/12/2021   INR 1.3 (H) 09/02/2020   INR 1.2 06/20/2020    Current radiological studies    DG Abd 1 View  Result Date: 12/11/2021 CLINICAL DATA:  Encounter for hypoglycemia, evaluate for shunt malfunction EXAM: ABDOMEN - 1 VIEW COMPARISON:  None. FINDINGS: Lines and tubes overlie the patient. No shunt visualized. The bowel gas pattern is normal. No radio-opaque calculi or other significant radiographic abnormality are seen. IMPRESSION: 1. Lines and tubes overlie the patient. No shunt visualized. 2. Negative. Electronically Signed   By: Iven Finn M.D.   On: 12/11/2021 23:43   CT HEAD WO CONTRAST (5MM)  Result Date: 12/12/2021 CLINICAL DATA:  Facial fracture follow-up. EXAM: CT HEAD WITHOUT CONTRAST TECHNIQUE: Contiguous axial images were obtained from the base of the skull through the vertex without intravenous contrast. RADIATION DOSE REDUCTION: This exam was performed according to the departmental dose-optimization program which includes automated exposure control, adjustment of the mA and/or kV according to patient size and/or use of iterative reconstruction technique. COMPARISON:  September 02, 2020 FINDINGS: Brain: There is persistent marked severity hydrocephalus involving the third ventricle and lateral ventricles. The fourth ventricle remains normal in appearance. There is no evidence of acute infarction, hemorrhage, extra-axial collection or mass lesion/mass effect. Vascular: No hyperdense vessel or unexpected calcification. Skull: A right frontal burr hole and associated fixation plate and screws are again seen. Sinuses/Orbits: No acute finding. Other: None. IMPRESSION: 1. No acute intracranial abnormality. 2. Persistent marked severity hydrocephalus, as described above, consistent with aqueductal stenosis. Electronically Signed   By: Virgina Norfolk M.D.   On: 12/12/2021 02:24   CT CERVICAL SPINE WO CONTRAST  Result Date: 12/12/2021 CLINICAL DATA:  Follow-up facial fracture. EXAM:  CT CERVICAL SPINE WITHOUT CONTRAST TECHNIQUE: Multidetector CT imaging of the cervical spine was performed without intravenous contrast. Multiplanar CT image reconstructions were also generated. RADIATION DOSE REDUCTION: This exam was performed according to the departmental dose-optimization program which includes automated exposure control, adjustment of the mA and/or kV according to patient size and/or use of iterative reconstruction technique. COMPARISON:  December 23, 2017 FINDINGS: Alignment: Normal. Skull base and vertebrae: No acute fracture. A 10 mm x 8 mm round lytic area is seen within the inferior aspect of the T7 vertebral body. This extends to involve the inferior endplate and is faintly seen on the prior study (sagittal reformatted image 30, CT series 6). Soft tissues and spinal canal: No prevertebral fluid or swelling. No visible canal hematoma. Disc levels: Mild endplate sclerosis is seen at the level of C4-C5. Mild anterior osteophyte formation is also seen at the level of C4-C5 and C6-C7. Very mild multilevel intervertebral disc space narrowing is seen. Normal, bilateral multilevel facet joints are noted. Upper chest: Negative. Other: None. IMPRESSION: 1. No acute fracture or subluxation of the cervical spine. 2.  Mild degenerative changes at the level of C4-C5 and C6-C7. 3. Enlarging lytic area within the inferior aspect of the T7 vertebral body, as described above. MRI correlation is recommended. Electronically Signed   By: Virgina Norfolk M.D.   On: 12/12/2021 02:31   DG Chest Port 1 View  Result Date: 12/13/2021 CLINICAL DATA:  Respiratory failure, seizure. EXAM: PORTABLE CHEST 1 VIEW COMPARISON:  12/11/2021 and CT chest 10/02/2020. FINDINGS: Trachea is midline. Heart size stable. Lungs are clear. No pleural fluid. IMPRESSION: No acute findings. Electronically Signed   By: Lorin Picket M.D.   On: 12/13/2021 08:23   DG Chest Port 1 View  Result Date: 12/11/2021 CLINICAL DATA:   Hypoglycemia. EXAM: PORTABLE CHEST 1 VIEW COMPARISON:  Portable chest 10/03/2019 FINDINGS: The heart size and mediastinal contours are within normal limits. Both lungs are clear. The visualized skeletal structures are unremarkable. IMPRESSION: No active disease.  Stable chest. Electronically Signed   By: Telford Nab M.D.   On: 12/11/2021 23:41    Disposition:    Discharge disposition: 01-Home or Self Care       Discharge Instructions     Increase activity slowly   Complete by: As directed        Allergies as of 12/13/2021       Reactions   Mushroom Extract Complex Hives, Itching, Nausea And Vomiting   Penicillins Anaphylaxis, Hives, Swelling   Has patient had a PCN reaction causing immediate rash, facial/tongue/throat swelling, SOB or lightheadedness with hypotension: Yes Has patient had a PCN reaction causing severe rash involving mucus membranes or skin necrosis: No Has patient had a PCN reaction that required hospitalization: Yes Has patient had a PCN reaction occurring within the last 10 years: Yes If all of the above answers are "NO", then may proceed with Cephalosporin use. Tolerated ceftriaxone 03/24/20   Sulfa Antibiotics Anaphylaxis, Hives   Clindamycin/lincomycin Hives        Medication List     STOP taking these medications    acetaminophen 325 MG tablet Commonly known as: TYLENOL   gabapentin 100 MG capsule Commonly known as: NEURONTIN   Insulin Syringe-Needle U-100 30G X 1/2" 0.5 ML Misc   NovoLIN 70/30 (70-30) 100 UNIT/ML injection Generic drug: insulin NPH-regular Human Replaced by: NovoLIN 70/30 Kwikpen (70-30) 100 UNIT/ML KwikPen   ondansetron 8 MG disintegrating tablet Commonly known as: Zofran ODT   pyridOXINE 50 MG tablet Commonly known as: B-6       TAKE these medications    Accu-Chek Guide test strip Generic drug: glucose blood Check blood glucose 4 times daily.Before breakfast,lunch and dinner. Also before bed time. What  changed: additional instructions   Accu-Chek Guide w/Device Kit 1 Device by Does not apply route daily.   Accu-Chek Softclix Lancets lancets Check blood glucose 3 times a day before meals and before bedtime. What changed:  how much to take how to take this   buPROPion 150 MG 24 hr tablet Commonly known as: Wellbutrin XL Take 1 tablet (150 mg total) by mouth every morning. Patient to take 1 tablet (150 mg total) for 6 days then 2 tablets (300 mg total)  daily   escitalopram 20 MG tablet Commonly known as: Lexapro Take 1 tablet (20 mg total) by mouth daily.   levETIRAcetam 500 MG tablet Commonly known as: Keppra Take 1 tablet (500 mg total) by mouth 2 (two) times daily.   levothyroxine 50 MCG tablet Commonly known as: SYNTHROID Take 1 tablet (50 mcg total) by mouth  daily before breakfast. Start taking on: December 14, 2021 What changed: when to take this   NovoLIN 70/30 Kwikpen (70-30) 100 UNIT/ML KwikPen Generic drug: insulin isophane & regular human KwikPen Inject 45 Units into the skin daily before breakfast AND 35 Units daily before lunch AND 25 Units daily before supper. Replaces: NovoLIN 70/30 (70-30) 100 UNIT/ML injection   QUEtiapine 200 MG tablet Commonly known as: SEROQUEL Take 2 tablets (400 mg total) by mouth at bedtime. What changed: medication strength         Follow-up appointment   Follow-up with PCP  Patient should also follow-up with other providers as determined by PCP, endocrinology, neurology, and psychiatry Discharge Condition:    stable   Signed: Lorene Dy 12/13/2021, 11:50 AM

## 2021-12-13 NOTE — Progress Notes (Signed)
eLink Physician-Brief Progress Note Patient Name: Mark [name] DOB: 1986/01/25 MRN: [id number]   Date of Service  12/13/2021  HPI/Events of Note  Patient c/o generalized body pain. Able to take PO.  eICU Interventions  Plan: Oxycodone IR 5 mg PO X 1 now.      Intervention Category Major Interventions: Other:  Lenell Antu 12/13/2021, 5:54 AM

## 2021-12-13 NOTE — Discharge Instructions (Addendum)
Mark [name],  You came in with a severely low blood glucose. We have treated you with IV fluids and you improved. I recommend checking blood sugar regularly and adjusting Insulin if you are not eating. You should follow up with your endocrinologist and primary care doctor.  I also recommend following up with your neurologist within the next month for your hydrocephalus and concerning lesion on your cervical spine. Your neurologist had already ordered the studies but it appears they were never completed.   In addition I recommend follow-up with your psychiatrist and I have prescribed 1 month supply of your psychiatry meds to take if you choose.  I realize cost may be a factor in you taking your medications.  I recommend talking to your physicians about sending your medications to this pharmacy:  [facility] Address: [address], [place], [place] [address]  The cost of your insulin should be $10-$20 per month at this pharmacy. The quick pen is the same cost at this pharmacy as you vials and should be easier to manage. In addition I recommend talking about    - Thurmon Fair, MD

## 2021-12-13 NOTE — Progress Notes (Addendum)
Attempted to go over discharge paperwork when patient became agitated and aggressive upon learning all I could provide was a bus pass. Patient pulled out IV x 2, EKG leads, pulse ox, and blood pressure cuff while yelling obscenities at me and charge RN. Patient refused to allow RN to review discharge paperwork and medications.  Security called.  Patient became more aggressive and agitated upon learning that he arrived with no shoes.  Now working to find shoes for patient with three security guards and a [place] [employer] present.

## 2021-12-13 NOTE — Progress Notes (Addendum)
Patient screaming at staff "I want a fucking taxi cab not a fucking bus pass" while ripping all IVs out, EKG stickers, all lines and tubes, ripping off gown. Explained to patient that we do not have taxi vouchers, and that this was all we could give patient.  Asked if patient would like for [place] to review discharge paperwork, patient refused. Patient proceeded to rip up bag of medications given to patient for discharge and slammed room door shut. Patient yelling at staff where their shoes are. Patient did not come in with shoes, only khaki pants, black shirt, and a phone.   1255: Security and [employer] now at bedside.

## 2021-12-13 NOTE — Progress Notes (Signed)
Taxi voucher obtained by CSW. Patient wheeled down by security with all belongings and discharge medications.

## 2021-12-14 ENCOUNTER — Telehealth: Payer: Self-pay

## 2021-12-14 NOTE — Telephone Encounter (Signed)
Transition Care Management Unsuccessful Follow-up Telephone Call  Date of discharge and from where:  12/13/2021, [facility]   Attempts:  1st Attempt  Reason for unsuccessful TCM follow-up call:  Left voice message on # [phone]. Call back requested.  Patient has appointment with Maurene Capes, PA @ [facility] - 12/18/2021

## 2021-12-15 ENCOUNTER — Telehealth: Payer: Self-pay

## 2021-12-15 NOTE — Telephone Encounter (Signed)
Transition Care Management Unsuccessful Follow-up Telephone Call  Date of discharge and from where:  12/13/2021, [facility]   Attempts:  2nd Attempt  Reason for unsuccessful TCM follow-up call:  Left voice message on # [phone]. Call back requested.   Patient has appointment with Maurene Capes, PA @ [facility] - 12/18/2021.

## 2021-12-18 ENCOUNTER — Telehealth: Payer: Self-pay

## 2021-12-18 ENCOUNTER — Inpatient Hospital Stay: Payer: Medicaid Other | Admitting: Physician Assistant

## 2021-12-18 NOTE — Telephone Encounter (Signed)
Transition Care Management Unsuccessful Follow-up Telephone Call  Date of discharge and from where:  12/13/2021, [facility]  Attempts:  3rd Attempt  Reason for unsuccessful TCM follow-up call:  Left voice message on # [phone].   Patient has appointment at [facility] today - 12/18/2021.

## 2021-12-27 ENCOUNTER — Emergency Department (HOSPITAL_COMMUNITY): Payer: Medicaid Other

## 2021-12-27 ENCOUNTER — Inpatient Hospital Stay (HOSPITAL_COMMUNITY): Payer: Medicaid Other

## 2021-12-27 ENCOUNTER — Encounter (HOSPITAL_COMMUNITY): Payer: Self-pay | Admitting: Emergency Medicine

## 2021-12-27 ENCOUNTER — Inpatient Hospital Stay (HOSPITAL_COMMUNITY)
Admission: EM | Admit: 2021-12-27 | Discharge: 2022-01-02 | DRG: 100 | Discharge status: Against Medical Advice | Payer: Medicaid Other | Attending: Internal Medicine | Admitting: Internal Medicine

## 2021-12-27 DIAGNOSIS — F3189 Other bipolar disorder: Secondary | ICD-10-CM | POA: Diagnosis not present

## 2021-12-27 DIAGNOSIS — U071 COVID-19: Secondary | ICD-10-CM | POA: Diagnosis present

## 2021-12-27 DIAGNOSIS — Z5329 Procedure and treatment not carried out because of patient's decision for other reasons: Secondary | ICD-10-CM | POA: Diagnosis present

## 2021-12-27 DIAGNOSIS — G911 Obstructive hydrocephalus: Secondary | ICD-10-CM | POA: Diagnosis present

## 2021-12-27 DIAGNOSIS — Z781 Physical restraint status: Secondary | ICD-10-CM

## 2021-12-27 DIAGNOSIS — G9341 Metabolic encephalopathy: Secondary | ICD-10-CM | POA: Diagnosis present

## 2021-12-27 DIAGNOSIS — F411 Generalized anxiety disorder: Secondary | ICD-10-CM | POA: Diagnosis present

## 2021-12-27 DIAGNOSIS — F341 Dysthymic disorder: Secondary | ICD-10-CM | POA: Diagnosis present

## 2021-12-27 DIAGNOSIS — Z882 Allergy status to sulfonamides status: Secondary | ICD-10-CM

## 2021-12-27 DIAGNOSIS — F5105 Insomnia due to other mental disorder: Secondary | ICD-10-CM | POA: Diagnosis present

## 2021-12-27 DIAGNOSIS — F17210 Nicotine dependence, cigarettes, uncomplicated: Secondary | ICD-10-CM | POA: Diagnosis present

## 2021-12-27 DIAGNOSIS — E876 Hypokalemia: Secondary | ICD-10-CM | POA: Diagnosis present

## 2021-12-27 DIAGNOSIS — M791 Myalgia, unspecified site: Secondary | ICD-10-CM | POA: Diagnosis present

## 2021-12-27 DIAGNOSIS — F3175 Bipolar disorder, in partial remission, most recent episode depressed: Secondary | ICD-10-CM | POA: Diagnosis present

## 2021-12-27 DIAGNOSIS — Z91199 Patient's noncompliance with other medical treatment and regimen due to unspecified reason: Secondary | ICD-10-CM

## 2021-12-27 DIAGNOSIS — R569 Unspecified convulsions: Secondary | ICD-10-CM | POA: Diagnosis present

## 2021-12-27 DIAGNOSIS — Z833 Family history of diabetes mellitus: Secondary | ICD-10-CM

## 2021-12-27 DIAGNOSIS — F1210 Cannabis abuse, uncomplicated: Secondary | ICD-10-CM | POA: Diagnosis present

## 2021-12-27 DIAGNOSIS — Z79890 Hormone replacement therapy (postmenopausal): Secondary | ICD-10-CM

## 2021-12-27 DIAGNOSIS — E10649 Type 1 diabetes mellitus with hypoglycemia without coma: Secondary | ICD-10-CM | POA: Diagnosis present

## 2021-12-27 DIAGNOSIS — Z794 Long term (current) use of insulin: Secondary | ICD-10-CM

## 2021-12-27 DIAGNOSIS — Z9151 Personal history of suicidal behavior: Secondary | ICD-10-CM | POA: Diagnosis not present

## 2021-12-27 DIAGNOSIS — F29 Unspecified psychosis not due to a substance or known physiological condition: Secondary | ICD-10-CM | POA: Diagnosis present

## 2021-12-27 DIAGNOSIS — Z79899 Other long term (current) drug therapy: Secondary | ICD-10-CM

## 2021-12-27 DIAGNOSIS — Z880 Allergy status to penicillin: Secondary | ICD-10-CM | POA: Diagnosis not present

## 2021-12-27 DIAGNOSIS — E039 Hypothyroidism, unspecified: Secondary | ICD-10-CM | POA: Diagnosis present

## 2021-12-27 DIAGNOSIS — F609 Personality disorder, unspecified: Secondary | ICD-10-CM | POA: Diagnosis present

## 2021-12-27 DIAGNOSIS — Z8669 Personal history of other diseases of the nervous system and sense organs: Secondary | ICD-10-CM

## 2021-12-27 DIAGNOSIS — E162 Hypoglycemia, unspecified: Secondary | ICD-10-CM | POA: Diagnosis not present

## 2021-12-27 DIAGNOSIS — E109 Type 1 diabetes mellitus without complications: Secondary | ICD-10-CM | POA: Diagnosis not present

## 2021-12-27 DIAGNOSIS — Z888 Allergy status to other drugs, medicaments and biological substances status: Secondary | ICD-10-CM

## 2021-12-27 DIAGNOSIS — M899 Disorder of bone, unspecified: Secondary | ICD-10-CM | POA: Diagnosis present

## 2021-12-27 DIAGNOSIS — E1042 Type 1 diabetes mellitus with diabetic polyneuropathy: Secondary | ICD-10-CM | POA: Diagnosis present

## 2021-12-27 DIAGNOSIS — G40909 Epilepsy, unspecified, not intractable, without status epilepticus: Secondary | ICD-10-CM | POA: Diagnosis present

## 2021-12-27 DIAGNOSIS — R509 Fever, unspecified: Secondary | ICD-10-CM

## 2021-12-27 DIAGNOSIS — F4321 Adjustment disorder with depressed mood: Secondary | ICD-10-CM | POA: Diagnosis present

## 2021-12-27 DIAGNOSIS — R451 Restlessness and agitation: Secondary | ICD-10-CM

## 2021-12-27 DIAGNOSIS — Z5321 Procedure and treatment not carried out due to patient leaving prior to being seen by health care provider: Secondary | ICD-10-CM | POA: Diagnosis not present

## 2021-12-27 LAB — CBG MONITORING, ED
Glucose-Capillary: 164 mg/dL — ABNORMAL HIGH (ref 70–99)
Glucose-Capillary: 122 mg/dL — ABNORMAL HIGH (ref 70–99)
Glucose-Capillary: 70 mg/dL (ref 70–99)
Glucose-Capillary: 49 mg/dL — ABNORMAL LOW (ref 70–99)
Glucose-Capillary: 122 mg/dL — ABNORMAL HIGH (ref 70–99)
Glucose-Capillary: 115 mg/dL — ABNORMAL HIGH (ref 70–99)
Glucose-Capillary: 115 mg/dL — ABNORMAL HIGH (ref 70–99)

## 2021-12-27 LAB — COMPREHENSIVE METABOLIC PANEL
ALT: 22 U/L (ref 0–44)
AST: 50 U/L — ABNORMAL HIGH (ref 15–41)
Albumin: 4 g/dL (ref 3.5–5.0)
Alkaline Phosphatase: 67 U/L (ref 38–126)
Anion gap: 10 (ref 5–15)
BUN: 8 mg/dL (ref 6–20)
CO2: 21 mmol/L — ABNORMAL LOW (ref 22–32)
Calcium: 9.2 mg/dL (ref 8.9–10.3)
Chloride: 106 mmol/L (ref 98–111)
Creatinine, Ser: 1.1 mg/dL (ref 0.61–1.24)
GFR, Estimated: >60 mL/min (ref >60)
Glucose, Bld: 170 mg/dL — ABNORMAL HIGH (ref 70–99)
Potassium: 3.3 mmol/L — ABNORMAL LOW (ref 3.5–5.1)
Sodium: 137 mmol/L (ref 135–145)
Total Bilirubin: 0.5 mg/dL (ref 0.3–1.2)
Total Protein: 6.6 g/dL (ref 6.5–8.1)

## 2021-12-27 LAB — CBC WITH DIFFERENTIAL/PLATELET
Abs Immature Granulocytes: 0.1 10*3/uL — ABNORMAL HIGH (ref 0.00–0.07)
Basophils Absolute: 0 10*3/uL (ref 0.0–0.1)
Basophils Relative: 0 %
Eosinophils Absolute: 0 10*3/uL (ref 0.0–0.5)
Eosinophils Relative: 0 %
HCT: 39.5 % (ref 39.0–52.0)
Hemoglobin: 13.6 g/dL (ref 13.0–17.0)
Immature Granulocytes: 1 %
Lymphocytes Relative: 7 %
Lymphs Abs: 1.2 10*3/uL (ref 0.7–4.0)
MCH: 32.2 pg (ref 26.0–34.0)
MCHC: 34.4 g/dL (ref 30.0–36.0)
MCV: 93.4 fL (ref 80.0–100.0)
Monocytes Absolute: 0.8 10*3/uL (ref 0.1–1.0)
Monocytes Relative: 4 %
Neutro Abs: 16.9 10*3/uL — ABNORMAL HIGH (ref 1.7–7.7)
Neutrophils Relative %: 88 %
Platelets: 327 10*3/uL (ref 150–400)
RBC: 4.23 MIL/uL (ref 4.22–5.81)
RDW: 11.9 % (ref 11.5–15.5)
WBC: 19 10*3/uL — ABNORMAL HIGH (ref 4.0–10.5)
nRBC: 0 % (ref 0.0–0.2)

## 2021-12-27 LAB — RESP PANEL BY RT-PCR (FLU A&B, COVID) ARPGX2
Influenza A by PCR: NEGATIVE
Influenza B by PCR: NEGATIVE
SARS Coronavirus 2 by RT PCR: POSITIVE — AB

## 2021-12-27 LAB — ETHANOL: Alcohol, Ethyl (B): <10 mg/dL (ref <10)

## 2021-12-27 LAB — GLUCOSE, CAPILLARY
Glucose-Capillary: 120 mg/dL — ABNORMAL HIGH (ref 70–99)
Glucose-Capillary: 139 mg/dL — ABNORMAL HIGH (ref 70–99)
Glucose-Capillary: 159 mg/dL — ABNORMAL HIGH (ref 70–99)

## 2021-12-27 LAB — SALICYLATE LEVEL: Salicylate Lvl: <7 mg/dL — ABNORMAL LOW (ref 7.0–30.0)

## 2021-12-27 LAB — ACETAMINOPHEN LEVEL: Acetaminophen (Tylenol), Serum: <10 ug/mL — ABNORMAL LOW (ref 10–30)

## 2021-12-27 LAB — LACTIC ACID, PLASMA: Lactic Acid, Venous: 1.1 mmol/L (ref 0.5–1.9)

## 2021-12-27 LAB — T4, FREE: Free T4: 0.7 ng/dL (ref 0.61–1.12)

## 2021-12-27 LAB — CK: Total CK: 1262 U/L — ABNORMAL HIGH (ref 49–397)

## 2021-12-27 LAB — TSH: TSH: 3.878 u[IU]/mL (ref 0.350–4.500)

## 2021-12-27 MED ORDER — POTASSIUM CHLORIDE 10 MEQ/100ML IV SOLN
10.0000 meq | INTRAVENOUS | Status: AC
  Administered 2021-12-27 (×3): 10 meq via INTRAVENOUS
  Filled 2021-12-27 (×3): qty 100

## 2021-12-27 MED ORDER — ENOXAPARIN SODIUM 30 MG/0.3ML IJ SOSY: 30.0000 mg | PREFILLED_SYRINGE | INTRAMUSCULAR | Status: DC

## 2021-12-27 MED ORDER — LORAZEPAM 2 MG/ML IJ SOLN
1.0000 mg | Freq: Once | INTRAMUSCULAR | Status: AC
  Administered 2021-12-27: 1 mg via INTRAVENOUS
  Filled 2021-12-27: qty 1

## 2021-12-27 MED ORDER — HALOPERIDOL LACTATE 5 MG/ML IJ SOLN
1.0000 mg | INTRAMUSCULAR | Status: DC | PRN
  Administered 2021-12-28 – 2022-01-01 (×4): 4 mg via INTRAVENOUS
  Administered 2022-01-01: 2 mg via INTRAVENOUS
  Administered 2022-01-02: 1 mg via INTRAVENOUS
  Filled 2021-12-27 (×8): qty 1

## 2021-12-27 MED ORDER — SODIUM CHLORIDE 0.9 % IV BOLUS
1000.0000 mL | Freq: Once | INTRAVENOUS | Status: AC
Start: 2021-12-27 — End: 2021-12-27
  Administered 2021-12-27: 1000 mL via INTRAVENOUS

## 2021-12-27 MED ORDER — ONDANSETRON HCL 4 MG/2ML IJ SOLN
4.0000 mg | Freq: Four times a day (QID) | INTRAMUSCULAR | Status: DC | PRN
Start: 2021-12-27 — End: 2022-01-02

## 2021-12-27 MED ORDER — DOCUSATE SODIUM 100 MG PO CAPS: 100.0000 mg | ORAL_CAPSULE | Freq: Two times a day (BID) | ORAL | Status: DC | PRN

## 2021-12-27 MED ORDER — SODIUM CHLORIDE 0.9% FLUSH
3.0000 mL | INTRAVENOUS | Status: DC | PRN
Start: 2021-12-27 — End: 2022-01-02

## 2021-12-27 MED ORDER — POLYETHYLENE GLYCOL 3350 17 G PO PACK: 17.0000 g | PACK | Freq: Every day | ORAL | Status: DC | PRN

## 2021-12-27 MED ORDER — DEXTROSE IN LACTATED RINGERS 5 % IV SOLN: INTRAVENOUS | Status: DC

## 2021-12-27 MED ORDER — DEXTROSE 50 % IV SOLN
1.0000 | Freq: Once | INTRAVENOUS | Status: AC
  Administered 2021-12-27: 50 mL via INTRAVENOUS
  Filled 2021-12-27: qty 50

## 2021-12-27 MED ORDER — HALOPERIDOL LACTATE 5 MG/ML IJ SOLN
5.0000 mg | Freq: Once | INTRAMUSCULAR | Status: AC
  Administered 2021-12-27: 5 mg via INTRAVENOUS
  Filled 2021-12-27: qty 1

## 2021-12-27 MED ORDER — SODIUM CHLORIDE 0.9% FLUSH
3.0000 mL | Freq: Two times a day (BID) | INTRAVENOUS | Status: DC
  Administered 2021-12-27 – 2022-01-01 (×10): 3 mL via INTRAVENOUS

## 2021-12-27 MED ORDER — LEVETIRACETAM IN NACL 1000 MG/100ML IV SOLN
1000.0000 mg | Freq: Two times a day (BID) | INTRAVENOUS | Status: DC
  Administered 2021-12-27 – 2022-01-02 (×12): 1000 mg via INTRAVENOUS
  Filled 2021-12-27 (×12): qty 100

## 2021-12-27 MED ORDER — ENOXAPARIN SODIUM 40 MG/0.4ML IJ SOSY
40.0000 mg | PREFILLED_SYRINGE | INTRAMUSCULAR | Status: DC
  Administered 2021-12-27 – 2022-01-01 (×6): 40 mg via SUBCUTANEOUS
  Filled 2021-12-27 (×6): qty 0.4

## 2021-12-27 MED ORDER — LACTATED RINGERS IV BOLUS
1000.0000 mL | Freq: Once | INTRAVENOUS | Status: AC
Start: 2021-12-27 — End: 2021-12-27
  Administered 2021-12-27: 1000 mL via INTRAVENOUS

## 2021-12-27 MED ORDER — SODIUM CHLORIDE 0.9 % IV SOLN
250.0000 mL | INTRAVENOUS | Status: DC | PRN
Start: 2021-12-27 — End: 2022-01-02

## 2021-12-27 NOTE — ED Notes (Signed)
Patient transported to CT 

## 2021-12-27 NOTE — Progress Notes (Signed)
eLink Physician-Brief Progress Note ?Patient Name: Mark [name] ?DOB: September 30, 1986 ?MRN: [id number] ? ? ?Date of Service ? 12/27/2021  ?HPI/Events of Note ? Multiple issues: 1. Agitation - Nursing request for bilateral soft wrist and ankle restraints and PRN sedation. QTc = 0.36 seconds. 2. Sinus Tachycardia - HR = 119.  ?eICU Interventions ? Plan: ?Bilateral soft wrist and ankle restraints X 12 hours. ?Haldol 1-4 mg IV Q 3 hours PRN agitated delirium. ?Bolus with 0.9 NaCl 1 liter IV over 1 hour now.   ? ? ? ?Intervention Category ?Major Interventions: Delirium, psychosis, severe agitation - evaluation and management ? ?Josef Tourigny Dennard Nip ?12/27/2021, 9:37 PM ?

## 2021-12-27 NOTE — ED Triage Notes (Signed)
Pt here from home with c/o seizure and l;ow blood sugar 52 and then 76 ,D10 and glucagon given, l64 on arrival , pt confused and combative on arrival  ?

## 2021-12-27 NOTE — H&P (Signed)
NAME:  Mark [name], MRN:  [id number], DOB:  1986/01/06, LOS: 0 ADMISSION DATE:  12/27/2021, CONSULTATION DATE:  12/27/2021 REFERRING MD:  Dr. Armandina Gemma, CHIEF COMPLAINT:  AMS/ hypoglycemia   History of Present Illness:  HPI obtained from medical review given patient remains encephalopathic and unable to answer questions.  No family at bedside.    66 yoM with prior hx significant for but not limited to T1DM, epilepsy, bipolar/ GAD, and hydrocephalus.   EMS called out for seizure activity, found confused and became agitated and combative enroute to ER.  Initial CBG 52, improved after D10 and glucagon.    Recent admit for refractory hypoglycemia 2/20> 2/22 thought related to poor PO intake.  Noted to have prior SI attempt at [facility] in 2019 with insulin OD.  Also noted to have lytic lesion of C7 but was unable to cooperate for MRI.   In ER, patient persistently agitated requiring ativan and restraints.  Remains altered in ER.  Afebrile but persistently tachycardic, normotensive, normal oxygenation, and hypoglycemic requiring D10.  Leukocytosis on CBC.  CT head with no acute changes.  PCCM called for admit given continued altered mental status and refractory hypoglycemia.   Pertinent  Medical History  has Hyponatremia; Severe recurrent major depression with psychotic features [facility]); MDD (major depressive disorder); Obstructive hydrocephalus ([place]); Truncal ataxia; Personality disorder in adult [facility]); Dysthymia; Adjustment disorder with depressed mood; Suicidal ideation; Bipolar I disorder, most recent episode depressed, severe without psychotic features ([place]); Homelessness; Marijuana abuse; Tobacco dependence; Seizure disorder ([place]); Hypothyroidism; Diabetic polyneuropathy associated with type 1 diabetes mellitus ([place]); Drug overdose, intentional ([place]); Insulin overdose; Drug abuse ([place]); Attempted suicide [facility]); MDD (major depressive disorder), recurrent episode, severe ([place]); Bipolar I disorder, current or  most recent episode depressed, in partial remission ([place]); Seizures ([place]); Bipolar I disorder ([place]); Uncontrolled type 1 diabetes mellitus with hyperglycemia ([place]); Insomnia due to other mental disorder; Right leg DVT ([place]); History of DVT of lower extremity; Bipolar affective disorder, current episode mixed ([place]); Generalized anxiety disorder; Hydrocephalus ([place]); Hyperglycemia; and Hypoglycemia on their problem list.  Significant Hospital Events: Including procedures, antibiotic start and stop dates in addition to other pertinent events   3/8 admitted for hypoglycemia and AMS  Interim History / Subjective:   Objective   Blood pressure (!) 146/93, pulse (!) 111, temperature 99 F (37.2 C), temperature source Axillary, resp. rate 17, SpO2 100 %.       No intake or output data in the 24 hours ending 12/27/21 1729 There were no vitals filed for this visit.  Examination: General:  ill and unkept appearing adult male lying in bed in bilateral wrist restraints  HEENT: MM pink/moist, very poor dentition, pupils 4/sluggish Neuro:  minimally responsive- moans with stimulation, MAE spont CV: ST, no murmur, distally warm/ perfused  PULM:  non labored, CTA, currently on room air GI: soft, bs+, ND, external urinary device in place Extremities: warm/dry, no LE edema, erythema to knees, no edema Skin: no rashes   Labs: K 3.3, bicarb 21, sCr 1.1, AG 10, AST 50, WBC 19, neg tylenol/ salicylate  Resolved Hospital Problem list    Assessment & Plan:   Metabolic Encephalopathy related to refractory hypoglycemia with possible other contributing factors to rule out > postictal vs r/o SCSE, SI attempt (previous SI attempt at [facility] in 2019 with insulin), sepsis Hx seizures, hydrocephalus  - CTH non acute P:  - Admit to ICU  - intubation risk given AMS - serial neuro exams - EEG now,  seizure precautions  - continue keppra  - infectious workup- blood cultures, UA, PCT, CXR  - pending UDS, CK, ammonia   - check TSH (ranged from 6 to 18 since 2021)/ FT4 - consider psych eval when able to participate.  Could check c-peptide but given he is T1DM, unlikely to help - need outpt dental eval   Type 1 DM Refractory hypoglycemia - previous hx of same - infectious workup as above  - D5LR @ 125m/hr for now, may need D10 - diabetes coordinator consult   Hypokalemia - k replete, check mag, recheck in am    C7 lytic lesion - found on previous admit but would not cooperate with MRI - if able, consider MRI if able to tolerate   Best Practice (right click and "Reselect all SmartList Selections" daily)   Diet/type: NPO DVT prophylaxis: prophylactic heparin  GI prophylaxis: N/A Lines: N/A Foley:  N/A Code Status:  full code Last date of multidisciplinary goals of care discussion [pending]  Labs   CBC: Recent Labs  Lab 12/27/21 1354  WBC 19.0*  NEUTROABS 16.9*  HGB 13.6  HCT 39.5  MCV 93.4  PLT 3161   Basic Metabolic Panel: Recent Labs  Lab 12/27/21 1354  NA 137  K 3.3*  CL 106  CO2 21*  GLUCOSE 170*  BUN 8  CREATININE 1.10  CALCIUM 9.2   GFR: CrCl cannot be calculated (Unknown ideal weight.). Recent Labs  Lab 12/27/21 1354  WBC 19.0*    Liver Function Tests: Recent Labs  Lab 12/27/21 1354  AST 50*  ALT 22  ALKPHOS 67  BILITOT 0.5  PROT 6.6  ALBUMIN 4.0   No results for input(s): LIPASE, AMYLASE in the last 168 hours. No results for input(s): AMMONIA in the last 168 hours.  ABG    Component Value Date/Time   PHART 7.450 09/03/2020 0736   PCO2ART 37.1 09/03/2020 0736   PO2ART 86 09/03/2020 0736   HCO3 26.8 12/12/2021 0033   TCO2 28 12/12/2021 0033   ACIDBASEDEF 3.1 (H) 03/21/2021 2150   O2SAT 79 12/12/2021 0033     Coagulation Profile: No results for input(s): INR, PROTIME in the last 168 hours.  Cardiac Enzymes: No results for input(s): CKTOTAL, CKMB, CKMBINDEX, TROPONINI in the last 168 hours.  HbA1C: Hemoglobin A1C  Date/Time Value  Ref Range Status  01/16/2021 03:05 PM 8.0 (A) 4.0 - 5.6 % Final  12/05/2020 03:12 PM 8.3 (A) 4.0 - 5.6 % Final  10/13/2013 03:46 AM 9.4 (H) 4.2 - 6.3 % Final    Comment:    The American Diabetes Association recommends that a primary goal of therapy should be <7% and that physicians should reevaluate the treatment regimen in patients with HbA1c values consistently >8%.   10/08/2013 03:01 PM 9.7 (H) 4.2 - 6.3 % Final    Comment:    The American Diabetes Association recommends that a primary goal of therapy should be <7% and that physicians should reevaluate the treatment regimen in patients with HbA1c values consistently >8%.    Hgb A1c MFr Bld  Date/Time Value Ref Range Status  03/21/2021 11:58 PM 8.4 (H) 4.8 - 5.6 % Final    Comment:    (NOTE)         Prediabetes: 5.7 - 6.4         Diabetes: >6.4         Glycemic control for adults with diabetes: <7.0   09/15/2020 11:16 AM 7.5 (H) 4.8 - 5.6 % Final  Comment:    (NOTE) Pre diabetes:          5.7%-6.4%  Diabetes:              >6.4%  Glycemic control for   <7.0% adults with diabetes     CBG: Recent Labs  Lab 12/27/21 1356 12/27/21 1437 12/27/21 1533  GLUCAP 164* 122* 70    Review of Systems:   Unable   Past Medical History:  He,  has a past medical history of Anxiety, Bipolar disorder ([place]), Depression, Diabetes mellitus without complication ([place]), Diabetic polyneuropathy associated with type 1 diabetes mellitus ([place]), Hydrocephalus ([place]), Kidney stones, Seizures ([place]), and Truncal ataxia.   Surgical History:   Past Surgical History:  Procedure Laterality Date   BRAIN SURGERY     hydrocephalus   KIDNEY STONE SURGERY       Social History:   reports that he has been smoking cigarettes. He has a 20.00 pack-year smoking history. He has never used smokeless tobacco. He reports current alcohol use. He reports current drug use. Frequency: 7.00 times per week. Drug: Marijuana.   Family History:  His family  history includes Breast cancer in his mother; Diabetes in his paternal aunt; Other in his father.   Allergies Allergies  Allergen Reactions   Mushroom Extract Complex Hives, Itching and Nausea And Vomiting   Penicillins Anaphylaxis, Hives and Swelling    Has patient had a PCN reaction causing immediate rash, facial/tongue/throat swelling, SOB or lightheadedness with hypotension: Yes Has patient had a PCN reaction causing severe rash involving mucus membranes or skin necrosis: No Has patient had a PCN reaction that required hospitalization: Yes Has patient had a PCN reaction occurring within the last 10 years: Yes If all of the above answers are "NO", then may proceed with Cephalosporin use. Tolerated ceftriaxone 03/24/20   Sulfa Antibiotics Anaphylaxis and Hives   Clindamycin/Lincomycin Hives     Home Medications  Prior to Admission medications   Medication Sig Start Date End Date Taking? Authorizing Provider  Blood Glucose Monitoring Suppl (ACCU-CHEK GUIDE) w/Device KIT 1 Device by Does not apply route daily. 12/13/21   Madalyn Rob, MD  buPROPion (WELLBUTRIN XL) 150 MG 24 hr tablet Take 1 tablet (150 mg total) by mouth every morning. Patient to take 1 tablet (150 mg total) for 6 days then 2 tablets (300 mg total)  daily 12/13/21 12/13/22  Madalyn Rob, MD  escitalopram (LEXAPRO) 20 MG tablet Take 1 tablet (20 mg total) by mouth daily. 12/13/21 01/12/22  Madalyn Rob, MD  glucose blood (ACCU-CHEK GUIDE) test strip Check blood glucose 4 times daily.Before breakfast,lunch and dinner. Also before bed time. 12/13/21   Madalyn Rob, MD  insulin isophane & regular human KwikPen (NOVOLIN 70/30 KWIKPEN) (70-30) 100 UNIT/ML KwikPen Inject 45 Units into the skin daily before breakfast AND 35 Units daily before lunch AND 25 Units daily before supper. 12/13/21   Madalyn Rob, MD  Accu-Chek Softclix Lancets lancets Check blood glucose 3 times a day before meals and before bedtime. 12/13/21   Madalyn Rob, MD   levETIRAcetam (KEPPRA) 500 MG tablet Take 1 tablet (500 mg total) by mouth 2 (two) times daily. 12/13/21   Madalyn Rob, MD  levothyroxine (SYNTHROID) 50 MCG tablet Take 1 tablet (50 mcg total) by mouth daily before breakfast. 12/14/21   Madalyn Rob, MD  QUEtiapine (SEROQUEL) 200 MG tablet Take 2 tablets (400 mg total) by mouth at bedtime. 12/13/21 01/12/22  Madalyn Rob, MD     Critical care  time: 40 mins       Kennieth Rad, ACNP [facility] 12/27/2021, 6:16 PM  See Amion for pager If no response to pager, please call PCCM consult pager After 7:00 pm call [place]

## 2021-12-27 NOTE — Procedures (Addendum)
EEG Inpatient Electroencephalogram Report ?Clinical Neurophysiology Services ? ? ?Patient Name: Mark [name] ?Age: 36 y.o. ?Sex: male ?MRN: [id number] ?Date: 12/27/2021 ?Location: 4N16  ?Neurologist:  Lynnae Sandhoff, MD ? ?Clinical Information ?Mark [name] is a 36 y.o. male Presented to the emergency room with concern for seizure. ? ?History is limited due to patient's agitation, altered mental status. ? ?EMS reported that they were called out for seizure, initially confused and became increasingly combative on route to ER.  His initial CBG was 52, improved after receiving D10 and glucagon. ?  ?Completed chart review, reviewed recent discharge summary, admission for hypoglycemia. ?  ?Patient has history of hydrocephalus, epilepsy. ?  ?Medications ? ?Prior to Admission medications   ?Medication Sig Start Date End Date Taking? Authorizing Provider  ?Blood Glucose Monitoring Suppl (ACCU-CHEK GUIDE) w/Device KIT 1 Device by Does not apply route daily. 12/13/21   Madalyn Rob, MD  ?buPROPion (WELLBUTRIN XL) 150 MG 24 hr tablet Take 1 tablet (150 mg total) by mouth every morning. Patient to take 1 tablet (150 mg total) for 6 days then 2 tablets (300 mg total)  daily 12/13/21 12/13/22  Madalyn Rob, MD  ?escitalopram (LEXAPRO) 20 MG tablet Take 1 tablet (20 mg total) by mouth daily. 12/13/21 01/12/22  Madalyn Rob, MD  ?glucose blood (ACCU-CHEK GUIDE) test strip Check blood glucose 4 times daily.Before breakfast,lunch and dinner. Also before bed time. 12/13/21   Madalyn Rob, MD  ?insulin isophane & regular human KwikPen (NOVOLIN 70/30 KWIKPEN) (70-30) 100 UNIT/ML KwikPen Inject 45 Units into the skin daily before breakfast AND 35 Units daily before lunch AND 25 Units daily before supper. 12/13/21   Madalyn Rob, MD  ?Accu-Chek Softclix Lancets lancets Check blood glucose 3 times a day before meals and before bedtime. 12/13/21   Madalyn Rob, MD  ?levETIRAcetam (KEPPRA) 500 MG tablet Take 1 tablet (500 mg total) by mouth 2  (two) times daily. 12/13/21   Madalyn Rob, MD  ?levothyroxine (SYNTHROID) 50 MCG tablet Take 1 tablet (50 mcg total) by mouth daily before breakfast. 12/14/21   Madalyn Rob, MD  ?QUEtiapine (SEROQUEL) 200 MG tablet Take 2 tablets (400 mg total) by mouth at bedtime. 12/13/21 01/12/22  Madalyn Rob, MD  ? ?Recording Conditions: ?This is a routine less than one hour EEG recording.  The EEG was performed utilizing standard international 10-20 system of electrode placement, with additional channels monitored for eye movement.  One channel electrocardiogram was monitored.  Data were obtained, stored, and interpreted according to ACNS guidelines (J Clin Neurophysiol 2006;23(2):85-183) utilizing referential montage recording, with reformatting to longitudinal, transverse bipolar, and referential montages as necessary for interpretation, along with digital/automated EEG analysis.  Patient tolerated entire procedure well.  Photic stimulation and hyperventilation were utilized as activation procedures unless otherwise specified below. ? ?EEG Description ? ?Background Activity: posterior dominant rhythm (PRD) was slow 7-8Hz , the predominant background consisted of moderate voltage arrhythmic alpha and delta activity. Brief 1-1.5 second periods of generalized EEG attenuation were also noted. ?Paroxysmal Abnormalities: None. ?Drowsiness/Sleep: Fragmented sleep architecture, including sleep spindles and vertex waves, was appreciated during the recording. ?Stimulation: Hyperventilation and photic stimulation were not performed. ?ECG Recording: Sinus tachycardia. ?Recording Quality: Minor muscle, motion, and eye movement artifacts were occasionally noted. ? ?EEG Interpretation ?Ictal Events: None ?2.   Interictal Abnormalities: slowed PDR, background arrhythmic alpha and delta activity. ?3.   Non-Epileptic Events: None ? ?Clinical Correlation  ? ?No seizures were recorded. ?The absence of epileptiform abnormalities does not preclude a  clinical  diagnosis of seizures.    ? ?EEG background findings are suggestive of a moderate encephalopathy but are etiologically nonspecific.  ? ?Abundant EMG and patient movement artifact may preclude detection of subtle EEG findings.    ? ? ?Electronically signed by:  ?Lynnae Sandhoff, MD ?Page: [phone] ?12/27/2021, 10:30 PM ? ? ? ?

## 2021-12-27 NOTE — ED Notes (Signed)
Called lab regarding adding CK to already collected labs. Per lab, they will add it on.  ?

## 2021-12-27 NOTE — ED Notes (Signed)
Pt not making any sense on arrival not answering questions thrashing about in bed cursing  ?

## 2021-12-27 NOTE — ED Notes (Signed)
This RN attempted to call report to receiving floor. Per Camera operator, they are currently in report and to call back within th next 10-15 minutes.  ?

## 2021-12-27 NOTE — Progress Notes (Signed)
EEG complete - results pending 

## 2021-12-27 NOTE — Progress Notes (Addendum)
eLink Physician-Brief Progress Note ?Patient Name: Mark [name] ?DOB: Mar 19, 1986 ?MRN: [id number] ? ? ?Date of Service ? 12/27/2021  ?HPI/Events of Note ? Fever to 101.5 F - Nursing request for Tylenol AST = 50, therefore, will avoid Tylenol. CXR on admission revealed no active disease.   ?eICU Interventions ? Plan: ?Ice packs PRN.  ?Repeat CXR in AM. ?Defer further w/u for fever and antibiotic Rx to PCCM rounding.   ? ? ? ?Intervention Category ?Major Interventions: Other: ? ?[name] ?12/27/2021, 11:16 PM ?

## 2021-12-27 NOTE — ED Provider Notes (Signed)
?  Physical Exam  ?BP (!) 140/92   Pulse (!) 111   Temp 99 ?F (37.2 ?C) (Axillary)   Resp 13   SpO2 100%  ? ? ? ?Procedures  ?Marland KitchenCritical Care ?Performed by: Ernie Avena, MD ?Authorized by: Ernie Avena, MD  ? ?Critical care provider statement:  ?  Critical care time (minutes):  30 ?  Critical care was necessary to treat or prevent imminent or life-threatening deterioration of the following conditions:  Endocrine crisis ?  Critical care was time spent personally by me on the following activities:  Development of treatment plan with patient or surrogate, discussions with consultants, evaluation of patient's response to treatment, examination of patient, ordering and review of laboratory studies, ordering and review of radiographic studies, ordering and performing treatments and interventions, pulse oximetry, re-evaluation of patient's condition and review of old charts ?  Care discussed with: admitting provider   ? ?ED Course / MDM  ?  ?Medical Decision Making ?Amount and/or Complexity of Data Reviewed ?Labs: ordered. ?Radiology: ordered. ? ?Risk ?Prescription drug management. ?Decision regarding hospitalization. ? ? ?49M, hx of sz and unresponsiveness, recent admission for unresponsiveness and hypoglycemia, similar presentation today. ? ?Given the patient's history of hydrocephalus, CT head was ordered.  The patient did require Ativan and Haldol in order to obtain CT imaging due to combativeness.  He was placed in restraints.  His initial CBG had been 52 which improved after receiving D10 and glucagon.  He had a recent hospital admission for hypoglycemia. ? ?Following CT imaging, the patient was found to have no acute abnormality on CT of his head.  He remained somewhat agitated and a blood glucose was rechecked and was found to be hypoglycemic to 39.  D50 was ordered and administered.  Pulmonary critical care medicine consulted for admission due to acute encephalopathy, hypoglycemia, concern for airway  protection and potential need for progression to endotracheal intubation.  Dr. Levon Hedger accepted the patient in admission to the ICU. ? ? ? ? ?  ?Ernie Avena, MD ?12/27/21 1823 ? ?

## 2021-12-27 NOTE — ED Provider Notes (Signed)
?[facility] ?Provider Note ? ? ?CSN: [id number] ?Arrival date & time: 12/27/21  1342 ? ?  ? ?History ? ?No chief complaint on file. ? ? ?Mark [name] is a 36 y.o. male.  Presented to the emergency room with concern for seizure. ? ?History is limited due to patient's agitation, altered mental status. ? ?EMS reported that they were called out for seizure, initially confused and became increasingly combative on route to ER.  His initial CBG was 52, improved after receiving D10 and glucagon. ? ?Completed chart review, reviewed recent discharge summary, admission for hypoglycemia. ? ?Patient has history of hydrocephalus, epilepsy. ? ?HPI ? ?  ? ?Home Medications ?Prior to Admission medications   ?Medication Sig Start Date End Date Taking? Authorizing Provider  ?Blood Glucose Monitoring Suppl (ACCU-CHEK GUIDE) w/Device KIT 1 Device by Does not apply route daily. 12/13/21   Madalyn Rob, MD  ?buPROPion (WELLBUTRIN XL) 150 MG 24 hr tablet Take 1 tablet (150 mg total) by mouth every morning. Patient to take 1 tablet (150 mg total) for 6 days then 2 tablets (300 mg total)  daily 12/13/21 12/13/22  Madalyn Rob, MD  ?escitalopram (LEXAPRO) 20 MG tablet Take 1 tablet (20 mg total) by mouth daily. 12/13/21 01/12/22  Madalyn Rob, MD  ?glucose blood (ACCU-CHEK GUIDE) test strip Check blood glucose 4 times daily.Before breakfast,lunch and dinner. Also before bed time. 12/13/21   Madalyn Rob, MD  ?insulin isophane & regular human KwikPen (NOVOLIN 70/30 KWIKPEN) (70-30) 100 UNIT/ML KwikPen Inject 45 Units into the skin daily before breakfast AND 35 Units daily before lunch AND 25 Units daily before supper. 12/13/21   Madalyn Rob, MD  ?Accu-Chek Softclix Lancets lancets Check blood glucose 3 times a day before meals and before bedtime. 12/13/21   Madalyn Rob, MD  ?levETIRAcetam (KEPPRA) 500 MG tablet Take 1 tablet (500 mg total) by mouth 2 (two) times daily. 12/13/21   Madalyn Rob, MD   ?levothyroxine (SYNTHROID) 50 MCG tablet Take 1 tablet (50 mcg total) by mouth daily before breakfast. 12/14/21   Madalyn Rob, MD  ?QUEtiapine (SEROQUEL) 200 MG tablet Take 2 tablets (400 mg total) by mouth at bedtime. 12/13/21 01/12/22  Madalyn Rob, MD  ?   ? ?Allergies    ?Mushroom extract complex, Penicillins, Sulfa antibiotics, and Clindamycin/lincomycin   ? ?Review of Systems   ?Review of Systems  ?Unable to perform ROS: Mental status change  ? ?Physical Exam ?Updated Vital Signs ?BP 130/78   Pulse (!) 124   Temp 99 ?F (37.2 ?C) (Axillary)   Resp 17   SpO2 100%  ?Physical Exam ?Vitals and nursing note reviewed.  ?Constitutional:   ?   Appearance: He is well-developed.  ?   Comments: Agitated, thrashing arms  ?HENT:  ?   Head: Normocephalic and atraumatic.  ?Eyes:  ?   Conjunctiva/sclera: Conjunctivae normal.  ?Cardiovascular:  ?   Rate and Rhythm: Normal rate and regular rhythm.  ?   Heart sounds: No murmur heard. ?Pulmonary:  ?   Effort: Pulmonary effort is normal. No respiratory distress.  ?   Breath sounds: Normal breath sounds.  ?Abdominal:  ?   Palpations: Abdomen is soft.  ?   Tenderness: There is no abdominal tenderness.  ?Musculoskeletal:  ?   Cervical back: Neck supple.  ?Skin: ?   General: Skin is warm and dry.  ?   Capillary Refill: Capillary refill takes less than 2 seconds.  ?Neurological:  ?   Comments:  Alert, repeating obscenities, agitated, moving all 4 extremities  ?Psychiatric:     ?   Mood and Affect: Mood normal.  ? ? ?ED Results / Procedures / Treatments   ?Labs ?(all labs ordered are listed, but only abnormal results are displayed) ?Labs Reviewed  ?CBC WITH DIFFERENTIAL/PLATELET - Abnormal; Notable for the following components:  ?    Result Value  ? WBC 19.0 (*)   ? Neutro Abs 16.9 (*)   ? Abs Immature Granulocytes 0.10 (*)   ? All other components within normal limits  ?COMPREHENSIVE METABOLIC PANEL - Abnormal; Notable for the following components:  ? Potassium 3.3 (*)   ? CO2 21 (*)    ? Glucose, Bld 170 (*)   ? AST 50 (*)   ? All other components within normal limits  ?ACETAMINOPHEN LEVEL - Abnormal; Notable for the following components:  ? Acetaminophen (Tylenol), Serum <10 (*)   ? All other components within normal limits  ?SALICYLATE LEVEL - Abnormal; Notable for the following components:  ? Salicylate Lvl <9.2 (*)   ? All other components within normal limits  ?CBG MONITORING, ED - Abnormal; Notable for the following components:  ? Glucose-Capillary 164 (*)   ? All other components within normal limits  ?CBG MONITORING, ED - Abnormal; Notable for the following components:  ? Glucose-Capillary 122 (*)   ? All other components within normal limits  ?ETHANOL  ?RAPID URINE DRUG SCREEN, HOSP PERFORMED  ?CBG MONITORING, ED  ? ? ?EKG ?EKG Interpretation ? ?Date/Time:  Wednesday December 27 2021 14:01:06 EST ?Ventricular Rate:  126 ?PR Interval:  158 ?QRS Duration: 83 ?QT Interval:  306 ?QTC Calculation: 443 ?R Axis:   69 ?Text Interpretation: Sinus tachycardia Consider left ventricular hypertrophy ST elev, probable normal early repol pattern Confirmed by Madalyn Rob [phone]) on 12/27/2021 3:27:57 PM ? ?Radiology ?No results found. ? ?Procedures ?Procedures  ? ? ?Medications Ordered in ED ?Medications  ?lactated ringers bolus 1,000 mL (has no administration in time range)  ?LORazepam (ATIVAN) injection 1 mg (1 mg Intravenous Given 12/27/21 1402)  ? ? ?ED Course/ Medical Decision Making/ A&P ?  ?                        ?Medical Decision Making ?Amount and/or Complexity of Data Reviewed ?Labs: ordered. ?Radiology: ordered. ? ?Risk ?Prescription drug management. ? ? ?36 year old male presents to ER with concern for hypoglycemia, possible seizure, agitation.  Here patient was very agitated, moving all 4 extremities and repeating obscenities but would not answer questions well.  He was given dose of Ativan for the agitation.  Has history of epilepsy, recent admission for hypoglycemia.  Seizure today could  have been from either epilepsy or hypoglycemia.  Repeat CBG here was improved.  Repeat CBG after being in ER was still stable.  Given history of hydrocephalus, will check CT head to ensure this does not worsen. ? ?Basic labs reviewed, mild hypokalemia, no other electrolyte derangement, no anemia, there is leukocytosis. ? ?While awaiting reassessment, CT head and further monitoring of his glucose, signed out to Dr. Armandina Gemma.  Final plan and disposition pending. ? ? ? ? ? ? ? ?Final Clinical Impression(s) / ED Diagnoses ?Final diagnoses:  ?Seizure ([place])  ?Hypoglycemia  ?Agitation  ? ? ?Rx / DC Orders ?ED Discharge Orders   ? ? None  ? ?  ? ? ?  ?Lucrezia Starch, MD ?12/27/21 1534 ? ?

## 2021-12-28 ENCOUNTER — Inpatient Hospital Stay (HOSPITAL_COMMUNITY): Payer: Medicaid Other

## 2021-12-28 DIAGNOSIS — G9341 Metabolic encephalopathy: Secondary | ICD-10-CM | POA: Diagnosis not present

## 2021-12-28 LAB — GLUCOSE, CAPILLARY
Glucose-Capillary: 186 mg/dL — ABNORMAL HIGH (ref 70–99)
Glucose-Capillary: 186 mg/dL — ABNORMAL HIGH (ref 70–99)
Glucose-Capillary: 206 mg/dL — ABNORMAL HIGH (ref 70–99)
Glucose-Capillary: 217 mg/dL — ABNORMAL HIGH (ref 70–99)
Glucose-Capillary: 237 mg/dL — ABNORMAL HIGH (ref 70–99)
Glucose-Capillary: 255 mg/dL — ABNORMAL HIGH (ref 70–99)
Glucose-Capillary: 260 mg/dL — ABNORMAL HIGH (ref 70–99)
Glucose-Capillary: 293 mg/dL — ABNORMAL HIGH (ref 70–99)
Glucose-Capillary: 300 mg/dL — ABNORMAL HIGH (ref 70–99)
Glucose-Capillary: 301 mg/dL — ABNORMAL HIGH (ref 70–99)
Glucose-Capillary: 305 mg/dL — ABNORMAL HIGH (ref 70–99)
Glucose-Capillary: 321 mg/dL — ABNORMAL HIGH (ref 70–99)

## 2021-12-28 LAB — CBC
HCT: 39.9 % (ref 39.0–52.0)
Hemoglobin: 13.7 g/dL (ref 13.0–17.0)
MCH: 32.3 pg (ref 26.0–34.0)
MCHC: 34.3 g/dL (ref 30.0–36.0)
MCV: 94.1 fL (ref 80.0–100.0)
Platelets: 284 10*3/uL (ref 150–400)
RBC: 4.24 MIL/uL (ref 4.22–5.81)
RDW: 12.5 % (ref 11.5–15.5)
WBC: 13.6 10*3/uL — ABNORMAL HIGH (ref 4.0–10.5)
nRBC: 0 % (ref 0.0–0.2)

## 2021-12-28 LAB — HEMOGLOBIN A1C
Hgb A1c MFr Bld: 7 % — ABNORMAL HIGH (ref 4.8–5.6)
Mean Plasma Glucose: 154.2 mg/dL

## 2021-12-28 LAB — BASIC METABOLIC PANEL
Anion gap: 10 (ref 5–15)
BUN: 7 mg/dL (ref 6–20)
CO2: 22 mmol/L (ref 22–32)
Calcium: 9 mg/dL (ref 8.9–10.3)
Chloride: 106 mmol/L (ref 98–111)
Creatinine, Ser: 1.12 mg/dL (ref 0.61–1.24)
GFR, Estimated: >60 mL/min (ref >60)
Glucose, Bld: 258 mg/dL — ABNORMAL HIGH (ref 70–99)
Potassium: 4.4 mmol/L (ref 3.5–5.1)
Sodium: 138 mmol/L (ref 135–145)

## 2021-12-28 LAB — RAPID URINE DRUG SCREEN, HOSP PERFORMED
Amphetamines: POSITIVE — AB
Barbiturates: NOT DETECTED
Benzodiazepines: POSITIVE — AB
Cocaine: NOT DETECTED
Opiates: NOT DETECTED
Tetrahydrocannabinol: POSITIVE — AB

## 2021-12-28 LAB — LACTIC ACID, PLASMA
Lactic Acid, Venous: 2.5 mmol/L (ref 0.5–1.9)
Lactic Acid, Venous: 1.2 mmol/L (ref 0.5–1.9)

## 2021-12-28 LAB — AMMONIA: Ammonia: 31 umol/L (ref 9–35)

## 2021-12-28 LAB — MAGNESIUM: Magnesium: 1.8 mg/dL (ref 1.7–2.4)

## 2021-12-28 LAB — PHOSPHORUS: Phosphorus: 2 mg/dL — ABNORMAL LOW (ref 2.5–4.6)

## 2021-12-28 MED ORDER — SODIUM PHOSPHATES 45 MMOLE/15ML IV SOLN
15.0000 mmol | Freq: Once | INTRAVENOUS | Status: AC
  Administered 2021-12-28: 08:00:00 15 mmol via INTRAVENOUS
  Filled 2021-12-28: qty 5

## 2021-12-28 MED ORDER — LACTATED RINGERS IV SOLN: INTRAVENOUS | Status: DC

## 2021-12-28 MED ORDER — LACTATED RINGERS IV BOLUS
1000.0000 mL | Freq: Once | INTRAVENOUS | Status: AC
  Administered 2021-12-28: 07:00:00 1000 mL via INTRAVENOUS

## 2021-12-28 MED ORDER — POTASSIUM CHLORIDE 10 MEQ/100ML IV SOLN
10.0000 meq | Freq: Once | INTRAVENOUS | Status: AC
  Administered 2021-12-28: 10 meq via INTRAVENOUS
  Filled 2021-12-28: qty 100

## 2021-12-28 MED ORDER — LEVOTHYROXINE SODIUM 50 MCG PO TABS
50.0000 ug | ORAL_TABLET | Freq: Every day | ORAL | Status: DC
  Administered 2021-12-30 – 2022-01-02 (×4): 50 ug via ORAL
  Filled 2021-12-28 (×4): qty 1

## 2021-12-28 MED ORDER — CHLORHEXIDINE GLUCONATE CLOTH 2 % EX PADS
6.0000 | MEDICATED_PAD | Freq: Every day | CUTANEOUS | Status: DC
  Administered 2021-12-28: 06:00:00 6 via TOPICAL

## 2021-12-28 MED ORDER — INSULIN ASPART 100 UNIT/ML IJ SOLN
0.0000 [IU] | INTRAMUSCULAR | Status: DC
  Administered 2021-12-28: 16:00:00 3 [IU] via SUBCUTANEOUS
  Administered 2021-12-28: 12:00:00 7 [IU] via SUBCUTANEOUS
  Administered 2021-12-28: 21:00:00 2 [IU] via SUBCUTANEOUS
  Administered 2021-12-29 (×2): 3 [IU] via SUBCUTANEOUS
  Administered 2021-12-29: 2 [IU] via SUBCUTANEOUS
  Administered 2021-12-29: 1 [IU] via SUBCUTANEOUS
  Administered 2021-12-29: 9 [IU] via SUBCUTANEOUS
  Administered 2021-12-29: 1 [IU] via SUBCUTANEOUS
  Administered 2021-12-30 (×2): 3 [IU] via SUBCUTANEOUS
  Administered 2021-12-30: 1 [IU] via SUBCUTANEOUS
  Administered 2021-12-30: 3 [IU] via SUBCUTANEOUS

## 2021-12-28 MED ORDER — INSULIN DETEMIR 100 UNIT/ML ~~LOC~~ SOLN
10.0000 [IU] | Freq: Two times a day (BID) | SUBCUTANEOUS | Status: DC
  Administered 2021-12-28 – 2021-12-30 (×4): 10 [IU] via SUBCUTANEOUS
  Filled 2021-12-28 (×7): qty 0.1

## 2021-12-28 MED ORDER — MAGNESIUM SULFATE 2 GM/50ML IV SOLN
2.0000 g | Freq: Once | INTRAVENOUS | Status: AC
Start: 2021-12-28 — End: 2021-12-29
  Administered 2021-12-28: 07:00:00 2 g via INTRAVENOUS
  Filled 2021-12-28: qty 50

## 2021-12-28 MED ORDER — DIVALPROEX SODIUM 250 MG PO DR TAB
250.0000 mg | DELAYED_RELEASE_TABLET | Freq: Two times a day (BID) | ORAL | Status: DC
  Filled 2021-12-28 (×3): qty 1

## 2021-12-28 NOTE — Progress Notes (Signed)
Inpatient Diabetes Program Recommendations ? ?AACE/ADA: New Consensus Statement on Inpatient Glycemic Control (2015) ? ?Target Ranges:  Prepandial:   less than 140 mg/dL ?     Peak postprandial:   less than 180 mg/dL (1-2 hours) ?     Critically ill patients:  140 - 180 mg/dL  ? ?Lab Results  ?Component Value Date  ? GLUCAP 300 (H) 12/28/2021  ? HGBA1C 8.4 (H) 03/21/2021  ? ? ?Review of Glycemic Control ? Latest Reference Range & Units 12/28/21 04:32 12/28/21 05:47 12/28/21 06:51  ?Glucose-Capillary 70 - 99 mg/dL 767 (H) 209 (H) 470 (H)  ?(H): Data is abnormally high ?Diabetes history: Type 1 DM ?Outpatient Diabetes medications: Novolin 70/30 45 B/35 L/25 D ?Current orders for Inpatient glycemic control: Novolog 0-9 units Q4h ? ?Inpatient Diabetes Program Recommendations:   ? ?Consider adding Levemir 10 units BID. ? ?Thanks, ?Lujean Rave, MSN, RNC-OB ?Diabetes Coordinator ?[phone] (8a-5p) ? ? ? ?

## 2021-12-28 NOTE — Progress Notes (Signed)
? ?NAME:  Mark [name], MRN:  [id number], DOB:  November 04, 1985, LOS: 1 ?ADMISSION DATE:  12/27/2021, CONSULTATION DATE:  12/27/2021 ?REFERRING MD:  Dr. Karene Fry, CHIEF COMPLAINT:  AMS/ hypoglycemia  ? ?History of Present Illness:  ?HPI obtained from medical review given patient remains encephalopathic and unable to answer questions.  No family at bedside.   ? ?30 yoM with prior hx significant for but not limited to T1DM, epilepsy, bipolar/ GAD, and hydrocephalus.   EMS called out for seizure activity, found confused and became agitated and combative enroute to ER.  Initial CBG 52, improved after D10 and glucagon.   ? ?Recent admit for refractory hypoglycemia 2/20> 2/22 thought related to poor PO intake.  Noted to have prior SI attempt at [facility] in 2019 with insulin OD.  Also noted to have lytic lesion of C7 but was unable to cooperate for MRI.   In ER, patient persistently agitated requiring ativan and restraints.  Remains altered in ER.  Afebrile but persistently tachycardic, normotensive, normal oxygenation, and hypoglycemic requiring D10.  Leukocytosis on CBC.  CT head with no acute changes.  PCCM called for admit given continued altered mental status and refractory hypoglycemia.  ? ?Pertinent  Medical History  ?Bipolar 1 Disorder/Personality Disorder ?Obstructive Hydrocephalus ?Tobacco dependence ?Seizure disorder ?Type I DM ?Intentional Insulin Overdose ? ?Significant Hospital Events: ?Including procedures, antibiotic start and stop dates in addition to other pertinent events   ?3/8 admitted for hypoglycemia and AMS, covid + ? ?Interim History / Subjective:  ? ?Patient with intermittent episodes of agitation overnight requiring haldol ? ?Objective   ?Blood pressure (!) 138/92, pulse (!) 102, temperature 99.5 ?F (37.5 ?C), temperature source Axillary, resp. rate 16, weight 95.2 kg, SpO2 100 %. ?   ?   ? ?Intake/Output Summary (Last 24 hours) at 12/28/2021 0743 ?Last data filed at 12/28/2021 0700 ?Gross per 24 hour  ?Intake  2600.72 ml  ?Output 755 ml  ?Net 1845.72 ml  ? ?Filed Weights  ? 12/28/21 0200  ?Weight: 95.2 kg  ? ? ?Examination: ?General:  appears older than stated age, disheveled, in bed in bilateral wrist restraints  ?HEENT: MM pink/moist, poor dentition, PERRL ?Neuro:  somnolent, arouses to tactile stimuli, agitated, moves all extremities spontaneously ?CV: rrr, no murmur, s1s2 ?PULM:  clear to auscultation bilaterally, no wheezing ?GI: soft, non-distended, non-tender, bowel sounds present ?Extremities: warm/dry, no LE edema ?Skin: no rashes  ? ?Resolved Hospital Problem list   ? ?Assessment & Plan:  ? 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 ?Type 1 DM ?Refractory hypoglycemia ?- hypoglycemia improved at this time on D5, will try to wean off this AM ?- Levemir 10 units BID per Diabetes Coordinator ?- Q4hr sliding scale insulin ? ?Hypokalemia ?Hypophosphatemia ?- replete PRN ? ?C7 lytic lesion ?- found on previous admit but would not cooperate with MRI ?- if able, consider MRI if able to tolerate ? ?Covid Positive ?- asymptomatic at this time, unlikely contributing to presentation ? ?Best Practice (right click and "Reselect all SmartList Selections" daily)  ? ?Diet/type: NPO ?DVT prophylaxis: prophylactic heparin  ?GI prophylaxis: N/A ?Lines: N/A ?Foley:  N/A ?  Code Status:  full code ?Last date of multidisciplinary goals of care discussion [pending] ? ?Labs   ?CBC: ?Recent Labs  ?Lab 12/27/21 ?1354 12/28/21 ?7829  ?WBC 19.0* 13.6*  ?NEUTROABS 16.9*  --   ?HGB 13.6 13.7  ?HCT 39.5 39.9  ?MCV 93.4 94.1  ?PLT 327 284  ? ? ?Basic  Metabolic Panel: ?Recent Labs  ?Lab 12/27/21 ?1354 12/28/21 ?5621  ?NA 137 138  ?K 3.3* 4.4  ?CL 106 106  ?CO2 21* 22  ?GLUCOSE 170* 258*  ?BUN 8 7  ?CREATININE 1.10 1.12  ?CALCIUM 9.2 9.0  ?MG  --  1.8  ?PHOS  --  2.0*  ? ?GFR: ?Estimated Creatinine Clearance: 104 mL/min (by C-G formula based on SCr of 1.12 mg/dL). ?Recent Labs  ?Lab 12/27/21 ?1354 12/27/21 ?2255 12/28/21 ?3086  ?WBC 19.0*  --  13.6*  ?LATICACIDVEN  --  1.1 2.5*  ? ? ?Liver Function Tests: ?Recent Labs  ?Lab 12/27/21 ?1354  ?AST 50*  ?ALT 22  ?ALKPHOS 67  ?BILITOT 0.5  ?PROT 6.6  ?ALBUMIN 4.0  ? ?No results for input(s): LIPASE, AMYLASE in the last 168 hours. ?Recent Labs  ?Lab 12/28/21 ?5784  ?AMMONIA 31  ? ? ?ABG ?   ?Component Value Date/Time  ? PHART 7.450 09/03/2020 0736  ? PCO2ART 37.1 09/03/2020 0736  ? PO2ART 86 09/03/2020 0736  ? HCO3 26.8 12/12/2021 0033  ? TCO2 28 12/12/2021 0033  ? ACIDBASEDEF 3.1 (H) 03/21/2021 2150  ? O2SAT 79 12/12/2021 0033  ?  ? ?Coagulation Profile: ?No results for input(s): INR, PROTIME in the last 168 hours. ? ?Cardiac Enzymes: ?Recent Labs  ?Lab 12/27/21 ?1717  ?CKTOTAL 1,262*  ? ? ?HbA1C: ?Hemoglobin A1C  ?Date/Time Value Ref Range Status  ?01/16/2021 03:05 PM 8.0 (A) 4.0 - 5.6 % Final  ?12/05/2020 03:12 PM 8.3 (A) 4.0 - 5.6 % Final  ?10/13/2013 03:46 AM 9.4 (H) 4.2 - 6.3 % Final  ?  Comment:  ?  The American Diabetes Association recommends that a primary goal of ?therapy should be <7% and that physicians should reevaluate the ?treatment regimen in patients with HbA1c values consistently >8%. ?  ?10/08/2013 03:01 PM 9.7 (H) 4.2 - 6.3 % Final  ?  Comment:  ?  The American Diabetes Association recommends that a primary goal of ?therapy should be <7% and that physicians should reevaluate the ?treatment regimen in patients with HbA1c values consistently >8%. ?  ? ?Hgb A1c MFr Bld  ?Date/Time Value Ref Range Status  ?03/21/2021 11:58 PM 8.4 (H) 4.8 - 5.6 % Final  ?  Comment:  ?  (NOTE) ?        Prediabetes: 5.7 -  6.4 ?        Diabetes: >6.4 ?        Glycemic control for adults with diabetes: <7.0 ?  ?09/15/2020 11:16 AM 7.5 (H) 4.8 - 5.6 % Final  ?  Comment:  ?  (NOTE) ?Pre diabetes:          5.7%-6.4% ? ?Diabetes:              >6.4% ? ?Glycemic control for   <7.0% ?adults with diabetes ?  ? ? ?CBG: ?Recent Labs  ?Lab 12/28/21 ?0202 12/28/21 ?0302 12/28/21 ?0432 12/28/21 ?6962 12/28/21 ?9528  ?GLUCAP 186* 260* 217* 255* 300*  ? ? ?Critical care time:  n/a ?  ? ?Melody Comas, MD ?[facility] Pulmonary & Critical [facility] ?Office: [phone] ? ? ?See Amion for personal pager ?PCCM on  call pager [phone] until 7pm. ?Please call Elink 7p-7a. [phone] ? ? ? ?

## 2021-12-28 NOTE — Progress Notes (Signed)
Pt w/ critical Lactic acid of 2.5. Elink MD informed. ?
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
## 2021-12-28 NOTE — Progress Notes (Addendum)
Pt only AOx1 self. Pt refuses to answer any questions, but will answer to his name wax/wane. Pt not able to complete admission questionnaire, therefore his admission not completed.  Pt's only belongings at bedside: tan pants, black t-shirt and belt, underwear, and a pair of dirty socks. This is all that was brought from the ED this admission. ?

## 2021-12-28 NOTE — Progress Notes (Signed)
eLink Physician-Brief Progress Note ?Patient Name: Mark [name] ?DOB: 04/03/1986 ?MRN: [id number] ? ? ?Date of Service ? 12/28/2021  ?HPI/Events of Note ? Lactic Acid = 2.5.  ?eICU Interventions ? Plan: ?Bolus with LR 1 liter IV over 1 hour now. ?Repeat Lactic Acid level at 10 AM.   ? ? ? ?Intervention Category ?Major Interventions: Acid-Base disturbance - evaluation and management ? ?Armstead Heiland Dennard Nip ?12/28/2021, 6:32 AM ?

## 2021-12-28 NOTE — Progress Notes (Signed)
Mg 1.8, Phos 2.0 ?Replaced per protocol  ?

## 2021-12-29 DIAGNOSIS — F3189 Other bipolar disorder: Secondary | ICD-10-CM

## 2021-12-29 DIAGNOSIS — G9341 Metabolic encephalopathy: Secondary | ICD-10-CM | POA: Diagnosis not present

## 2021-12-29 LAB — CBC
HCT: 37.6 % — ABNORMAL LOW (ref 39.0–52.0)
Hemoglobin: 12.8 g/dL — ABNORMAL LOW (ref 13.0–17.0)
MCH: 31.8 pg (ref 26.0–34.0)
MCHC: 34 g/dL (ref 30.0–36.0)
MCV: 93.3 fL (ref 80.0–100.0)
Platelets: 303 10*3/uL (ref 150–400)
RBC: 4.03 MIL/uL — ABNORMAL LOW (ref 4.22–5.81)
RDW: 12.1 % (ref 11.5–15.5)
WBC: 9.4 10*3/uL (ref 4.0–10.5)
nRBC: 0 % (ref 0.0–0.2)

## 2021-12-29 LAB — GLUCOSE, CAPILLARY
Glucose-Capillary: 145 mg/dL — ABNORMAL HIGH (ref 70–99)
Glucose-Capillary: 146 mg/dL — ABNORMAL HIGH (ref 70–99)
Glucose-Capillary: 200 mg/dL — ABNORMAL HIGH (ref 70–99)
Glucose-Capillary: 209 mg/dL — ABNORMAL HIGH (ref 70–99)
Glucose-Capillary: 216 mg/dL — ABNORMAL HIGH (ref 70–99)
Glucose-Capillary: 386 mg/dL — ABNORMAL HIGH (ref 70–99)

## 2021-12-29 LAB — PHOSPHORUS: Phosphorus: 2.4 mg/dL — ABNORMAL LOW (ref 2.5–4.6)

## 2021-12-29 LAB — BASIC METABOLIC PANEL
Anion gap: 11 (ref 5–15)
BUN: 9 mg/dL (ref 6–20)
CO2: 21 mmol/L — ABNORMAL LOW (ref 22–32)
Calcium: 8.8 mg/dL — ABNORMAL LOW (ref 8.9–10.3)
Chloride: 106 mmol/L (ref 98–111)
Creatinine, Ser: 0.97 mg/dL (ref 0.61–1.24)
GFR, Estimated: >60 mL/min (ref >60)
Glucose, Bld: 130 mg/dL — ABNORMAL HIGH (ref 70–99)
Potassium: 3.8 mmol/L (ref 3.5–5.1)
Sodium: 138 mmol/L (ref 135–145)

## 2021-12-29 LAB — AMYLASE: Amylase: 23 U/L — ABNORMAL LOW (ref 28–100)

## 2021-12-29 LAB — MAGNESIUM: Magnesium: 1.7 mg/dL (ref 1.7–2.4)

## 2021-12-29 LAB — LIPASE, BLOOD: Lipase: 24 U/L (ref 11–51)

## 2021-12-29 MED ORDER — MAGNESIUM SULFATE 2 GM/50ML IV SOLN
2.0000 g | Freq: Once | INTRAVENOUS | Status: AC
  Administered 2021-12-29: 2 g via INTRAVENOUS
  Filled 2021-12-29: qty 50

## 2021-12-29 MED ORDER — POTASSIUM PHOSPHATES 15 MMOLE/5ML IV SOLN
30.0000 mmol | Freq: Once | INTRAVENOUS | Status: AC
  Administered 2021-12-29: 30 mmol via INTRAVENOUS
  Filled 2021-12-29: qty 10

## 2021-12-29 MED ORDER — VALPROATE SODIUM 100 MG/ML IV SOLN
250.0000 mg | Freq: Two times a day (BID) | INTRAVENOUS | Status: DC
Start: 2021-12-29 — End: 2022-01-01
  Administered 2021-12-29 – 2022-01-01 (×6): 250 mg via INTRAVENOUS
  Filled 2021-12-29 (×10): qty 2.5

## 2021-12-29 NOTE — Progress Notes (Signed)
PT Cancellation Note ? ?Patient Details ?Name: Mark [name] ?MRN: [id number] ?DOB: [birth year] ? ? ?Cancelled Treatment:    Reason Eval/Treat Not Completed: Patient's level of consciousness;Patient at procedure or test/unavailable. Pt was being prepared to be transferred to another floor and nurse requested that he not be aroused before transfer. ? ?Armanda Heritage, SPT ? ? ?Armanda Heritage ?12/29/2021, 4:29 PM ?

## 2021-12-29 NOTE — Progress Notes (Signed)
?  Transition of Care (TOC) Screening Note ? ? ?Patient Details  ?Name: Mark [name] ?Date of Birth: 10/01/1986 ? ? ?Transition of Care [facility] CM/SW Contact:    ?Glennon Mac, RN ?Phone Number: ?12/29/2021, 4:31 PM ? ? ? ?Transition of Care Department [facility]) has reviewed patient and no TOC needs have been identified at this time. We will continue to monitor patient advancement through interdisciplinary progression rounds. If new patient transition needs arise, please place a TOC consult. ? ?Quintella Baton, RN, BSN  ?Trauma/Neuro ICU Case Manager ?[phone] ? ?

## 2021-12-29 NOTE — Evaluation (Signed)
Clinical/Bedside Swallow Evaluation ?Patient Details  ?Name: Mark [name] ?MRN: [id number] ?Date of Birth: 24-Oct-1985 ? ?Today's Date: 12/29/2021 ?Time: SLP Start Time (ACUTE ONLY): 1321 SLP Stop Time (ACUTE ONLY): 1335 ?SLP Time Calculation (min) (ACUTE ONLY): 14 min ? ?Past Medical History:  ?Past Medical History:  ?Diagnosis Date  ? Anxiety   ? Bipolar disorder (H[facility])   ? Depression   ? Diabetes mellitus without complication (H[facility])   ? Diabetic polyneuropathy associated with type 1 diabetes mellitus (HCC)   ? Hydrocephalus (H[facility])   ? Kidney stones   ? Seizures ([facility])   ? Truncal ataxia   ? ?Past Surgical History:  ?Past Surgical History:  ?Procedure Laterality Date  ? BRAIN SURGERY    ? hydrocephalus  ? KIDNEY STONE SURGERY    ? ?HPI:  ?36 yo with prior hx significant for T1DM, epilepsy, bipolar/ GAD, and hydrocephalus, psychotic disorder at baseline. Admitted for seizure activity, found confused and became agitated and combative enroute to ER. Metabolic encephalopathy. MBS 08/2020 rec'd Dys 3/thin silent aspiration with consecutive straw sips  ?  ?Assessment / Plan / Recommendation  ?Clinical Impression ? Pt cooperative and eager for po's. He is missing some dentition most are black and flush with the gum. There was no significant oral-motor weakness and cough is moderately strong. Minimal concern with aspiration as he has Covid currently after 1-2 delayed coughs and throat clears after thin liquid. He is impulsive needing cues to slow down with applesauce and thin. Recommend initiate regular/thin liquids, straws, pills with liquid and assist for feeding and regulation of pace. ST will follow. ?SLP Visit Diagnosis: Dysphagia, unspecified (R13.10) ?   ?Aspiration Risk ? Mild aspiration risk  ?  ?Diet Recommendation Regular;Thin liquid  ? ?Liquid Administration via: Cup;Straw ?Medication Administration: Whole meds with puree ?Supervision: Staff to assist with self feeding;Intermittent supervision to cue for  compensatory strategies ?Compensations: Slow rate;Small sips/bites;Minimize environmental distractions ?Postural Changes: Seated upright at 90 degrees  ?  ?Other  Recommendations Oral Care Recommendations: Oral care BID   ? ?Recommendations for follow up therapy are one component of a multi-disciplinary discharge planning process, led by the attending physician.  Recommendations may be updated based on patient status, additional functional criteria and insurance authorization. ? ?Follow up Recommendations No SLP follow up  ? ? ?  ?Assistance Recommended at Discharge None  ?Functional Status Assessment Patient has had a recent decline in their functional status and demonstrates the ability to make significant improvements in function in a reasonable and predictable amount of time.  ?Frequency and Duration min 2x/week  ?2 weeks ?  ?   ? ?Prognosis Prognosis for Safe Diet Advancement: Good  ? ?  ? ?Swallow Study   ?General HPI: 36 yo with prior hx significant for T1DM, epilepsy, bipolar/ GAD, and hydrocephalus, psychotic disorder at baseline. Admitted for seizure activity, found confused and became agitated and combative enroute to ER. Metabolic encephalopathy. MBS 08/2020 rec'd Dys 3/thin silent aspiration with consecutive straw sips ?Type of Study: Bedside Swallow Evaluation ?Previous Swallow Assessment:  (none) ?Diet Prior to this Study: NPO ?Temperature Spikes Noted: No ?Respiratory Status: Room air ?History of Recent Intubation: No ?Behavior/Cognition: Alert;Cooperative ?Oral Cavity Assessment: Other (comment) (poor dental condition) ?Oral Care Completed by SLP: No ?Oral Cavity - Dentition: Missing dentition;Poor condition ?Vision: Functional for self-feeding ?Self-Feeding Abilities: Able to feed self;Needs assist ?Patient Positioning: Upright in bed ?Baseline Vocal Quality: Normal ?Volitional Cough: Strong ?Volitional Swallow: Able to elicit  ?  ?Oral/Motor/Sensory Function Overall Oral  Motor/Sensory Function:  Within functional limits   ?Ice Chips Ice chips: Not tested   ?Thin Liquid Thin Liquid: Within functional limits ?Pharyngeal  Phase Impairments: Throat Clearing - Delayed;Cough - Delayed  ?  ?Nectar Thick Nectar Thick Liquid: Not tested   ?Honey Thick Honey Thick Liquid: Not tested   ?Puree Puree: Within functional limits   ?Solid ? ? ?  Solid: Within functional limits  ? ?  ? ?Royce Macadamia ?12/29/2021,2:55 PM ? ? ? ?

## 2021-12-29 NOTE — Progress Notes (Signed)
? ?NAME:  Mark [name], MRN:  [id number], DOB:  05-Nov-1985, LOS: 2 ?ADMISSION DATE:  12/27/2021, CONSULTATION DATE:  12/27/2021 ?REFERRING MD:  Dr. Karene Fry, CHIEF COMPLAINT:  AMS/ hypoglycemia  ? ?History of Present Illness:  ?HPI obtained from medical review given patient remains encephalopathic and unable to answer questions.  No family at bedside.   ? ?78 yoM with prior hx significant for but not limited to T1DM, epilepsy, bipolar/ GAD, and hydrocephalus.   EMS called out for seizure activity, found confused and became agitated and combative enroute to ER.  Initial CBG 52, improved after D10 and glucagon.   ? ?Recent admit for refractory hypoglycemia 2/20> 2/22 thought related to poor PO intake.  Noted to have prior SI attempt at [facility] in 2019 with insulin OD.  Also noted to have lytic lesion of C7 but was unable to cooperate for MRI.   In ER, patient persistently agitated requiring ativan and restraints.  Remains altered in ER.  Afebrile but persistently tachycardic, normotensive, normal oxygenation, and hypoglycemic requiring D10.  Leukocytosis on CBC.  CT head with no acute changes.  PCCM called for admit given continued altered mental status and refractory hypoglycemia.  ? ?Pertinent  Medical History  ?Bipolar 1 Disorder/Personality Disorder ?Obstructive Hydrocephalus ?Tobacco dependence ?Seizure disorder ?Type I DM ?Intentional Insulin Overdose ? ?Significant Hospital Events: ?Including procedures, antibiotic start and stop dates in addition to other pertinent events   ?3/8 admitted for hypoglycemia and AMS, covid + ? ?Interim History / Subjective:  ? ?No acute events overnight. Patient is pleasant this morning.  ? ?Objective   ?Blood pressure (!) 130/95, pulse 88, temperature 98.6 ?F (37 ?C), temperature source Axillary, resp. rate 14, weight 95 kg, SpO2 98 %. ?   ?   ? ?Intake/Output Summary (Last 24 hours) at 12/29/2021 0743 ?Last data filed at 12/29/2021 0600 ?Gross per 24 hour  ?Intake 1638.39 ml  ?Output  3400 ml  ?Net -1761.61 ml  ? ?Filed Weights  ? 12/28/21 0200 12/29/21 0500  ?Weight: 95.2 kg 95 kg  ? ? ?Examination: ?General:  no acute distress, resting in bed, wrist restraints in place ?HEENT: MM pink/moist, poor dentition, PERRL ?Neuro:  somnolent, arouses to verbal stimuli, not agitated this morning, A&Ox2 ?CV: rrr, no murmur, s1s2 ?PULM:  clear to auscultation bilaterally, no wheezing ?GI: soft, non-distended, non-tender, bowel sounds present ?Extremities: warm/dry, no LE edema ?Skin: no rashes  ? ?Resolved Hospital Problem list   ? ?Assessment & Plan:  ? 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 ?Type 1 DM ?Refractory hypoglycemia - Resolved ?- hypoglycemia improved, D5 stopped on 3/9 ?- Levemir 10 units BID per Diabetes Coordinator ?- Q4hr sliding scale insulin ?- will adjust today based on resuming diet ? ?Hypokalemia ?Hypophosphatemia ?- replete PRN ? ?C7 lytic lesion ?- found on previous admit but would not cooperate with MRI ?- if able, consider MRI if able to tolerate ? ?Covid Positive ?- asymptomatic at this time, unlikely contributing to presentation ? ?Disposition: Will likely transfer out of ICU this afternoon. ? ?Best Practice (right click and "Reselect all SmartList Selections" daily)  ? ?  Diet/type: NPO ?DVT prophylaxis: prophylactic heparin  ?GI prophylaxis: N/A ?Lines: N/A ?Foley:  N/A ?Code Status:  full code ?Last date of multidisciplinary goals of care discussion [pending] ? ?Labs   ?CBC: ?Recent Labs  ?Lab 12/27/21 ?1354 12/28/21 ?[id number] 12/29/21 ?[id number]  ?WBC 19.0* 13.6* 9.4   ?NEUTROABS 16.9*  --   --   ?HGB 13.6 13.7 12.8*  ?HCT 39.5 39.9 37.6*  ?MCV 93.4 94.1 93.3  ?PLT 327 284 303  ? ? ?Basic Metabolic Panel: ?Recent Labs  ?Lab 12/27/21 ?1354 12/28/21 ?[id number] 12/29/21 ?[id number]  ?NA 137 138 138  ?K 3.3* 4.4 3.8  ?CL 106 106 106  ?CO2 21* 22 21*  ?GLUCOSE 170* 258* 130*  ?BUN 8 7 9   ?CREATININE 1.10 1.12 0.97  ?CALCIUM 9.2 9.0 8.8*  ?MG  --  1.8 1.7  ?PHOS  --  2.0* 2.4*  ? ?GFR: ?Estimated Creatinine Clearance: 120.1 mL/min (by C-G formula based on SCr of 0.97 mg/dL). ?Recent Labs  ?Lab 12/27/21 ?1354 12/27/21 ?2255 12/28/21 ?[id number] 12/28/21 ?1007 12/29/21 ?[id number]  ?WBC 19.0*  --  13.6*  --  9.4  ?LATICACIDVEN  --  1.1 2.5* 1.2  --   ? ? ?Liver Function Tests: ?Recent Labs  ?Lab 12/27/21 ?1354  ?AST 50*  ?ALT 22  ?ALKPHOS 67  ?BILITOT 0.5  ?PROT 6.6  ?ALBUMIN 4.0  ? ?No results for input(s): LIPASE, AMYLASE in the last 168 hours. ?Recent Labs  ?Lab 12/28/21 ?[id number]  ?AMMONIA 31  ? ? ?ABG ?   ?Component Value Date/Time  ? PHART 7.450 09/03/2020 0736  ? PCO2ART 37.1 09/03/2020 0736  ? PO2ART 86 09/03/2020 0736  ? HCO3 26.8 12/12/2021 0033  ? TCO2 28 12/12/2021 0033  ? ACIDBASEDEF 3.1 (H) 03/21/2021 2150  ? O2SAT 79 12/12/2021 0033  ?  ? ?Coagulation Profile: ?No results for input(s): INR, PROTIME in the last 168 hours. ? ?Cardiac Enzymes: ?Recent Labs  ?Lab 12/27/21 ?1717  ?CKTOTAL 1,262*  ? ? ?HbA1C: ?Hemoglobin A1C  ?Date/Time Value Ref Range Status  ?10/13/2013 03:46 AM 9.4 (H) 4.2 - 6.3 % Final  ?  Comment:  ?  The American Diabetes Association recommends that a primary goal of ?therapy should be <7% and that physicians should reevaluate the ?treatment regimen in patients with HbA1c values consistently >8%. ?  ?10/08/2013 03:01 PM 9.7 (H) 4.2 - 6.3 % Final  ?  Comment:  ?  The American Diabetes Association recommends that a primary goal of ?therapy should be <7% and that physicians should reevaluate the ?treatment regimen in patients with HbA1c values consistently >8%. ?  ? ?Hgb A1c MFr Bld   ?Date/Time Value Ref Range Status  ?12/28/2021 05:09 AM 7.0 (H) 4.8 - 5.6 % Final  ?  Comment:  ?  (NOTE) ?Pre diabetes:          5.7%-6.4% ? ?Diabetes:              >6.4% ? ?Glycemic control for   <7.0% ?adults with diabetes ?  ?03/21/2021 11:58 PM 8.4 (H) 4.8 - 5.6 % Final  ?  Comment:  ?  (NOTE) ?        Prediabetes: 5.7 - 6.4 ?        Diabetes: >6.4 ?        Glycemic control for adults with diabetes: <7.0 ?  ? ? ?CBG: ?Recent Labs  ?Lab 12/28/21 ?1546 12/28/21 ?1956 12/29/21 ?0030 12/29/21 ?[id number] 12/29/21 ?0735  ?GLUCAP 237* 186* 200* 146* 145*  ? ? ?  Critical care time:  n/a ?  ? ?Melody Comas, MD ?[facility] ?Office: [phone] ? ? ?See Amion for personal pager ?PCCM on call pager [phone] until 7pm. ?Please call Elink 7p-7a. [phone] ? ? ? ?
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
## 2021-12-30 ENCOUNTER — Encounter (HOSPITAL_COMMUNITY): Payer: Self-pay | Admitting: Internal Medicine

## 2021-12-30 ENCOUNTER — Other Ambulatory Visit: Payer: Self-pay

## 2021-12-30 DIAGNOSIS — F3189 Other bipolar disorder: Secondary | ICD-10-CM

## 2021-12-30 LAB — CBC
HCT: 36 % — ABNORMAL LOW (ref 39.0–52.0)
Hemoglobin: 12.4 g/dL — ABNORMAL LOW (ref 13.0–17.0)
MCH: 31.5 pg (ref 26.0–34.0)
MCHC: 34.4 g/dL (ref 30.0–36.0)
MCV: 91.4 fL (ref 80.0–100.0)
Platelets: 276 10*3/uL (ref 150–400)
RBC: 3.94 MIL/uL — ABNORMAL LOW (ref 4.22–5.81)
RDW: 11.9 % (ref 11.5–15.5)
WBC: 9.3 10*3/uL (ref 4.0–10.5)
nRBC: 0 % (ref 0.0–0.2)

## 2021-12-30 LAB — GLUCOSE, CAPILLARY
Glucose-Capillary: 139 mg/dL — ABNORMAL HIGH (ref 70–99)
Glucose-Capillary: 195 mg/dL — ABNORMAL HIGH (ref 70–99)
Glucose-Capillary: 210 mg/dL — ABNORMAL HIGH (ref 70–99)
Glucose-Capillary: 227 mg/dL — ABNORMAL HIGH (ref 70–99)
Glucose-Capillary: 231 mg/dL — ABNORMAL HIGH (ref 70–99)
Glucose-Capillary: 236 mg/dL — ABNORMAL HIGH (ref 70–99)
Glucose-Capillary: 269 mg/dL — ABNORMAL HIGH (ref 70–99)

## 2021-12-30 LAB — BASIC METABOLIC PANEL
Anion gap: 7 (ref 5–15)
BUN: 7 mg/dL (ref 6–20)
CO2: 28 mmol/L (ref 22–32)
Calcium: 8.9 mg/dL (ref 8.9–10.3)
Chloride: 102 mmol/L (ref 98–111)
Creatinine, Ser: 0.84 mg/dL (ref 0.61–1.24)
GFR, Estimated: >60 mL/min (ref >60)
Glucose, Bld: 178 mg/dL — ABNORMAL HIGH (ref 70–99)
Potassium: 3.4 mmol/L — ABNORMAL LOW (ref 3.5–5.1)
Sodium: 137 mmol/L (ref 135–145)

## 2021-12-30 LAB — PHOSPHORUS: Phosphorus: 2.6 mg/dL (ref 2.5–4.6)

## 2021-12-30 LAB — MAGNESIUM: Magnesium: 1.9 mg/dL (ref 1.7–2.4)

## 2021-12-30 MED ORDER — HYDROXYZINE HCL 25 MG PO TABS
25.0000 mg | ORAL_TABLET | Freq: Once | ORAL | Status: AC
  Administered 2021-12-30: 25 mg via ORAL
  Filled 2021-12-30: qty 1

## 2021-12-30 MED ORDER — POTASSIUM CHLORIDE CRYS ER 20 MEQ PO TBCR
40.0000 meq | EXTENDED_RELEASE_TABLET | Freq: Once | ORAL | Status: AC
  Administered 2021-12-30: 40 meq via ORAL
  Filled 2021-12-30: qty 2

## 2021-12-30 MED ORDER — INSULIN DETEMIR 100 UNIT/ML ~~LOC~~ SOLN
13.0000 [IU] | Freq: Two times a day (BID) | SUBCUTANEOUS | Status: DC
  Administered 2021-12-30 – 2021-12-31 (×2): 13 [IU] via SUBCUTANEOUS
  Filled 2021-12-30 (×3): qty 0.13

## 2021-12-30 MED ORDER — LORAZEPAM 2 MG/ML IJ SOLN: 1.0000 mg | Freq: Once | INTRAMUSCULAR | Status: AC

## 2021-12-30 MED ORDER — INSULIN ASPART 100 UNIT/ML IJ SOLN
0.0000 [IU] | Freq: Three times a day (TID) | INTRAMUSCULAR | Status: DC
  Administered 2021-12-30: 5 [IU] via SUBCUTANEOUS
  Administered 2021-12-31: 3 [IU] via SUBCUTANEOUS
  Administered 2021-12-31 (×2): 9 [IU] via SUBCUTANEOUS
  Administered 2022-01-01: 2 [IU] via SUBCUTANEOUS
  Administered 2022-01-01: 5 [IU] via SUBCUTANEOUS
  Administered 2022-01-02: 1 [IU] via SUBCUTANEOUS

## 2021-12-30 MED ORDER — INSULIN ASPART 100 UNIT/ML IJ SOLN
0.0000 [IU] | Freq: Every day | INTRAMUSCULAR | Status: DC
  Administered 2022-01-01: 3 [IU] via SUBCUTANEOUS

## 2021-12-30 MED ORDER — KETOROLAC TROMETHAMINE 15 MG/ML IJ SOLN
15.0000 mg | Freq: Once | INTRAMUSCULAR | Status: AC
  Administered 2021-12-30: 15 mg via INTRAVENOUS
  Filled 2021-12-30: qty 1

## 2021-12-30 MED ORDER — LORAZEPAM 2 MG/ML IJ SOLN
INTRAMUSCULAR | Status: AC
Start: 2021-12-30 — End: 2021-12-30
  Administered 2021-12-30: 1 mg via INTRAVENOUS
  Filled 2021-12-30: qty 1

## 2021-12-30 NOTE — Progress Notes (Signed)
OT Cancellation Note ? ?Patient Details ?Name: ADMIRAL MARCUCCI ?MRN: [id number] ?DOB: 08-07-1986 ? ? ?Cancelled Treatment:    Reason Eval/Treat Not Completed: Patient at procedure or test/ unavailable (OT evaluation to f/u as appropriate.) ? ?Linzie Boursiquot A Laura-Lee Villegas ?12/30/2021, 10:05 AM ?

## 2021-12-30 NOTE — Evaluation (Signed)
Occupational Therapy Evaluation Patient Details Name: Mark [name] MRN: [id number] DOB: 1985-12-26 Today's Date: 12/30/2021   History of Present Illness 36 y/o male presented to ED on 12/27/21 for seizure and low blood sugar. PMHx: T1DM, epilepsy, bipolar/ GAD, and hydrocephalus.   Clinical Impression   Mark [name] was evaluated s/p the above admission list, he reports he is indep at baseline and lives with his friend [name]. Upon evaluation pt was lethargic and provided limited home set up/PLOF information. Pt presented with slow slurred speech and required repetitive cues to follow 80% of commands. Overall pt required min A for mobility due to posterior LOB and unsteadiness, and up to mod A for ADLs. He will benefit from OT to address the limitations listed below. Recommend intensive therapies at the CIR level to progress towards indep baseline.     Recommendations for follow up therapy are one component of a multi-disciplinary discharge planning process, led by the attending physician.  Recommendations may be updated based on patient status, additional functional criteria and insurance authorization.   Follow Up Recommendations  Acute inpatient rehab (3hours/day)    Assistance Recommended at Discharge Frequent or constant Supervision/Assistance  Patient can return home with the following A little help with walking and/or transfers;A little help with bathing/dressing/bathroom;Assistance with cooking/housework;Direct supervision/assist for medications management;Assist for transportation;Help with stairs or ramp for entrance    Functional Status Assessment  Patient has had a recent decline in their functional status and demonstrates the ability to make significant improvements in function in a reasonable and predictable amount of time.  Equipment Recommendations  BSC/3in1 (RW)    Recommendations for Other Services Rehab consult     Precautions / Restrictions Precautions Precautions:  Fall Precaution Comments: seizure, covid + Restrictions Weight Bearing Restrictions: No      Mobility Bed Mobility Overal bed mobility: Needs Assistance Bed Mobility: Supine to Sit, Sit to Supine     Supine to sit: Min assist     General bed mobility comments: min A for posterior LOB    Transfers Overall transfer level: Needs assistance Equipment used: None Transfers: Sit to/from Stand, Bed to chair/wheelchair/BSC Sit to Stand: Min guard     Step pivot transfers: Min assist     General transfer comment: no AD, pt unsteady requiring min A to correct. Will likely benefit from AD      Balance Overall balance assessment: Needs assistance Sitting-balance support: Feet supported Sitting balance-Leahy Scale: Poor Sitting balance - Comments: several posterior LOB   Standing balance support: No upper extremity supported, During functional activity Standing balance-Leahy Scale: Poor Standing balance comment: required min A for balance                           ADL either performed or assessed with clinical judgement   ADL Overall ADL's : Needs assistance/impaired Eating/Feeding: Set up;Sitting   Grooming: Min guard;Standing   Upper Body Bathing: Supervision/ safety;Sitting   Lower Body Bathing: Moderate assistance;Sit to/from stand   Upper Body Dressing : Set up;Sitting   Lower Body Dressing: Moderate assistance;Sit to/from stand Lower Body Dressing Details (indicate cue type and reason): unable to don socks sitting EOB Toilet Transfer: Minimal assistance;Ambulation Toilet Transfer Details (indicate cue type and reason): no AD this date, unsteady and would benefit Toileting- Clothing Manipulation and Hygiene: Min guard;Sitting/lateral lean       Functional mobility during ADLs: Minimal assistance General ADL Comments: pt lethargic, required repetitive cues. undteady and min A  for balance and safety     Vision Baseline Vision/History: 0 No visual  deficits Ability to See in Adequate Light: 0 Adequate Patient Visual Report: No change from baseline Vision Assessment?: No apparent visual deficits            Pertinent Vitals/Pain Pain Assessment Pain Assessment: No/denies pain     Hand Dominance Right   Extremity/Trunk Assessment Upper Extremity Assessment Upper Extremity Assessment: RUE deficits/detail;LUE deficits/detail RUE Deficits / Details: grossly weak 4/5, poor coordination RUE Sensation: WNL RUE Coordination: decreased fine motor LUE Deficits / Details: grossly weak 4/5, poor coordination LUE Sensation: WNL LUE Coordination: decreased fine motor   Lower Extremity Assessment Lower Extremity Assessment: Defer to PT evaluation   Cervical / Trunk Assessment Cervical / Trunk Assessment: Normal   Communication Communication Communication: Expressive difficulties (slow slurred speech)   Cognition Arousal/Alertness: Lethargic Behavior During Therapy: Flat affect Overall Cognitive Status: No family/caregiver present to determine baseline cognitive functioning                                 General Comments: orientedx4, lethargic and required repetitve cues to follow commands. Limited history obtained. get frustrated easily, unable to work through it despite cues     General Comments  VSS on RA, pt lethargic            Home Living Family/patient expects to be discharged to:: Private residence Living Arrangements: Non-relatives/Friends Available Help at Discharge: Friend(s);Available 24 hours/day Type of Home: House Home Access: Stairs to enter [employer] of Steps: 3   Home Layout: One level                   Additional Comments: pt lethargic & provided limited home set up information      Prior Functioning/Environment Prior Level of Function : Independent/Modified Independent;Patient poor historian/Family not available             Mobility Comments: no AD ADLs  Comments: indep, does not work, does not drive        OT Problem List: Decreased strength;Decreased range of motion;Decreased activity tolerance;Impaired balance (sitting and/or standing);Decreased coordination;Decreased cognition;Decreased safety awareness;Decreased knowledge of use of DME or AE;Decreased knowledge of precautions      OT Treatment/Interventions: Self-care/ADL training;Therapeutic exercise;Patient/family education;Balance training;Therapeutic activities;DME and/or AE instruction    OT Goals(Current goals can be found in the care plan section) Acute Rehab OT Goals Patient Stated Goal: did not state OT Goal Formulation: With patient Time For Goal Achievement: 01/13/22 Potential to Achieve Goals: Good ADL Goals Pt Will Perform Grooming: with modified independence;standing Pt Will Perform Lower Body Dressing: with modified independence;sit to/from stand Pt Will Transfer to Toilet: with modified independence;ambulating Additional ADL Goal #1: Pt will indep complete IADL medication management task Additional ADL Goal #2: Pt will follow 3 step directional task independently as a precursor to safe IADLs  OT Frequency: Min 2X/week    Co-evaluation PT/OT/SLP Co-Evaluation/Treatment: Yes Reason for Co-Treatment: Necessary to address cognition/behavior during functional activity   OT goals addressed during session: ADL's and self-care      AM-PAC OT "6 Clicks" Daily Activity     Outcome Measure Help from another person eating meals?: A Little Help from another person taking care of personal grooming?: A Little Help from another person toileting, which includes using toliet, bedpan, or urinal?: A Little Help from another person bathing (including washing, rinsing, drying)?: A Lot Help from another  person to put on and taking off regular upper body clothing?: A Little Help from another person to put on and taking off regular lower body clothing?: A Lot 6 Click Score: 16    End of Session Nurse Communication: Mobility status  Activity Tolerance: Patient tolerated treatment well Patient left: in chair;with call bell/phone within reach;with chair alarm set  OT Visit Diagnosis: Unsteadiness on feet (R26.81);Other abnormalities of gait and mobility (R26.89);Muscle weakness (generalized) (M62.81)                Time: [id number] OT Time Calculation (min): 12 min Charges:  OT General Charges $OT Visit: 1 Visit OT Evaluation $OT Eval Moderate Complexity: 1 Mod   Velinda Wrobel A Zamya Culhane 12/30/2021, 12:27 PM

## 2021-12-30 NOTE — Plan of Care (Signed)
Pt is alert oriented x 4. Pt had bilateral soft wrist restraint. Pt has been calm and resting. Restraints have been discontinued. IV fluids continued per order. Antiseizure medications given per order.  ? ?Problem: Safety: ?Goal: Violent Restraint(s) ?Outcome: Completed/Met ?  ?Problem: Safety: ?Goal: Non-violent Restraint(s) ?Outcome: Completed/Met ?  ?

## 2021-12-30 NOTE — Progress Notes (Signed)
2132 Pt bed alarm going off. Door opened as Nurse applied PPE pt noted walking towards door unsteady, pt asked to please wait for help. Pt noted falling at door. Pt Stood up again and fell again. Pt hit his head twice. Pt agitated. Pt stood up again and walked himself to the bed. Pt asked to wait for help. No physical injury noted. Pt cursing and stated he was coming to get nurse because he was having pain. Education provide. VS taken, pt assessed with Charge nurse Delice Bison, RN. Provider paged to inform of pt fall and also pt request for pain medication. Pt denies pain to head. Pt is oriented x 4. No neurological changes.  ? ?

## 2021-12-30 NOTE — Progress Notes (Signed)
[facility]                                                                              Patient Demographics  Mark [name], is a 36 y.o. male, DOB - 08-Oct-1986, [id number]  Admit date - 12/27/2021   Admitting Physician Lorin Glass, MD  Outpatient Primary MD for the patient is Rema Fendt, NP  Outpatient specialists:   LOS - 3  days   Medical records reviewed and are as summarized below:        Brief summary   Per admission history by PCCM on 3/8 Patient is a 36 year old male with history of type 1 diabetes mellitus, epilepsy, bipolar/GAD and hydrocephalus.  EMS was called out for seizure activity, found confused and became agitated and combative in route to ER.  Initial CBG 52, improved after D10 and glucagon. Patient had a recent admission for refractory hypoglycemia 2/20> 11/2020 thought related to poor p.o. intake, prior SI attempt at [facility] in 2019 with insulin OOD.  Also noted to have lytic lesion of T7 but unable to cooperate for MRI.  In ED, persistently agitated requiring Ativan and restraints. Hypoglycemic, afebrile but persistently tachycardic.  PCCM was called for admission 3/11: Patient transferred to [facility], assumed care   Assessment & Plan    Acute metabolic encephalopathy in the setting of seizure activity, hydrocephalus -EEG negative for seizure activity -Was recently started on Wellbutrin which can lower seizure threshold, discontinued and not recommended to restart per psychiatry -Continue Keppra -Started on Depakote 250 mg twice daily per psychiatry -Fully alert and oriented   History of bipolar disorder, presented with acute encephalopathy -Appreciate psychiatry recommendations, -Continue to hold Seroquel given sedation, recommended not to start bupropion -Continue Keppra, started Depakote  twice daily  Lytic lesion T7 incidentally seen on CT C-spine in 11/2021 -Discussed with the patient and initially agreed for MRI T-spine  for further work-up.  However once patient was in MRI, he became agitated, stopped cooperating and started kicking around, hence MRI was not done.  Outpatient follow-up by PCP  Type 1 diabetes mellitus with refractory hypoglycemia -Resolved, started on Levemir 10 units twice daily -CBGs still elevated, increase Levemir to 13 units twice daily, continue sliding scale insulin  Hypokalemia -Replaced  COVID-19 -Incidentally found, asymptomatic, no hypoxia or infiltrates. -Continue to monitor, isolation x 10 days.  Hypothyroidism Continue Synthroid 50 mcg daily  Code Status: Full CODE STATUS DVT Prophylaxis:  enoxaparin (LOVENOX) injection 40 mg Start: 12/27/21 1800 SCDs Start: 12/27/21 1749   Level of Care: Level of care: Progressive Family Communication:   Disposition Plan:     Status is: Inpatient Remains inpatient appropriate because: Work-up in progress, PT OT evaluation pending  Time Spent in minutes   35 minutes  Procedures:  EEG  Consultants:   Neurology Admitted by CCM  Antimicrobials:   None    Medications  Scheduled Meds:  enoxaparin (LOVENOX) injection  40 mg Subcutaneous Q24H   insulin aspart  0-9 Units Subcutaneous Q4H   insulin detemir  10 Units Subcutaneous BID   levothyroxine  50 mcg Oral Q0600   potassium chloride  40 mEq  Oral Once   sodium chloride flush  3 mL Intravenous Q12H   Continuous Infusions:  sodium chloride     lactated ringers Stopped (12/29/21 1607)   levETIRAcetam 1,000 mg (12/30/21 0612)   valproate sodium 250 mg (12/30/21 0437)   PRN Meds:.sodium chloride, docusate sodium, haloperidol lactate, ondansetron (ZOFRAN) IV, polyethylene glycol, sodium chloride flush      Subjective:   Mark [name] was seen and examined today.  No acute complaints, alert and oriented no seizure activity overnight.  Patient denies dizziness, chest pain, shortness of breath, abdominal pain, N/V/D/C.  Objective:   Vitals:   12/30/21 0451  12/30/21 0755 12/30/21 1038 12/30/21 1110  BP:  115/76  113/69  Pulse:    73  Resp:  13  16  Temp:  98 F (36.7 C)  98 F (36.7 C)  TempSrc:  Oral  Oral  SpO2:  98%  98%  Weight: 92.7 kg     Height:   6\' 2"  (1.88 m)     Intake/Output Summary (Last 24 hours) at 12/30/2021 1209 Last data filed at 12/30/2021 0755 Gross per 24 hour  Intake 1376.9 ml  Output 1500 ml  Net -123.1 ml     Wt Readings from Last 3 Encounters:  12/30/21 92.7 kg  12/13/21 85.5 kg  04/07/21 97.6 kg     Exam General: Alert and oriented x 3, NAD Cardiovascular: S1 S2 auscultated, no murmurs, RRR Respiratory: Clear to auscultation bilaterally, no wheezing, rales or rhonchi Gastrointestinal: Soft, nontender, nondistended, + bowel sounds Ext: no pedal edema bilaterally Neuro: no new deficits bilaterally, strength 5/5 in upper and lower extremities Musculoskeletal: No digital cyanosis, clubbing   Data Reviewed:  I have personally reviewed following labs and imaging studies  Micro Results Recent Results (from the past 240 hour(s))  Resp Panel by RT-PCR (Flu A&B, Covid) Nasopharyngeal Swab     Status: Abnormal   Collection Time: 12/27/21  7:21 PM   Specimen: Nasopharyngeal Swab; Nasopharyngeal(NP) swabs in vial transport medium  Result Value Ref Range Status   SARS Coronavirus 2 by RT PCR POSITIVE (A) NEGATIVE Final    Comment: (NOTE) SARS-CoV-2 target nucleic acids are DETECTED.  The SARS-CoV-2 RNA is generally detectable in upper respiratory specimens during the acute phase of infection. Positive results are indicative of the presence of the identified virus, but do not rule out bacterial infection or co-infection with other pathogens not detected by the test. Clinical correlation with patient history and other diagnostic information is necessary to determine patient infection status. The expected result is Negative.  Fact Sheet for Patients: BloggerCourse.comhttps://www.fda.gov/media/152166/download  Fact  Sheet for Healthcare Providers: SeriousBroker.ithttps://www.fda.gov/media/152162/download  This test is not yet approved or cleared by the [place] FDA and  has been authorized for detection and/or diagnosis of SARS-CoV-2 by FDA under an Emergency Use Authorization (EUA).  This EUA will remain in effect (meaning this test can be used) for the duration of  the COVID-19 declaration under Section 564(b)(1) of the A ct, 21 U.S.C. section 360bbb-3(b)(1), unless the authorization is terminated or revoked sooner.     Influenza A by PCR NEGATIVE NEGATIVE Final   Influenza B by PCR NEGATIVE NEGATIVE Final    Comment: (NOTE) The Xpert Xpress SARS-CoV-2/FLU/RSV plus assay is intended as an aid in the diagnosis of influenza from Nasopharyngeal swab specimens and should not be used as a sole basis for treatment. Nasal washings and aspirates are unacceptable for Xpert Xpress SARS-CoV-2/FLU/RSV testing.  Fact Sheet for Patients: BloggerCourse.comhttps://www.fda.gov/media/152166/download  Fact Sheet for Healthcare Providers: SeriousBroker.it  This test is not yet approved or cleared by the [place] FDA and has been authorized for detection and/or diagnosis of SARS-CoV-2 by FDA under an Emergency Use Authorization (EUA). This EUA will remain in effect (meaning this test can be used) for the duration of the COVID-19 declaration under Section 564(b)(1) of the Act, 21 U.S.C. section 360bbb-3(b)(1), unless the authorization is terminated or revoked.  Performed at [facility], [address], [place], [place] [address]   Culture, blood (routine x 2)     Status: None (Preliminary result)   Collection Time: 12/27/21 10:40 PM   Specimen: BLOOD  Result Value Ref Range Status   Specimen Description BLOOD LEFT ANTECUBITAL  Final   Special Requests   Final    BOTTLES DRAWN AEROBIC AND ANAEROBIC Blood Culture adequate volume   Culture   Final    NO GROWTH 3 DAYS Performed at [facility], [address] [place], [place] [address]    Report Status PENDING  Incomplete  Culture, blood (routine x 2)     Status: None (Preliminary result)   Collection Time: 12/28/21  5:11 AM   Specimen: BLOOD LEFT HAND  Result Value Ref Range Status   Specimen Description BLOOD LEFT HAND  Final   Special Requests AEROBIC BOTTLE ONLY Blood Culture adequate volume  Final   Culture   Final    NO GROWTH 2 DAYS Performed at [facility], [address], [place], [place] [address]    Report Status PENDING  Incomplete    Radiology Reports CT HEAD WO CONTRAST ( )  Result Date: 12/27/2021 IMPRESSION: 1. No acute intracranial process. 2. 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 Electronically Signed   By: Wiliam Ke M.D.   On: 12/27/2021 17:09   CT CERVICAL SPINE WO CONTRAST  Result Date: 12/12/2021  IMPRESSION: 1. No acute fracture or subluxation of the cervical spine. 2. Mild degenerative changes at the level of C4-C5 and C6-C7. 3. Enlarging lytic area within the inferior aspect of the T7 vertebral body, as described above. MRI correlation is recommended. Electronically Signed   By: Aram Candela M.D.   On: 12/12/2021 02:31   DG CHEST PORT 1 VIEW  Result Date: 12/28/2021 IMPRESSION: No evidence of active cardiopulmonary disease. Electronically Signed   By: Jackey Loge D.O.   On: 12/28/2021 08:19     EEG adult  Result Date: 12/27/2021 MD Recording Conditions: This is a routine less than one hour EEG recording.  The EEG was performed utilizing standard international 10-20 system of electrode placement, with additional channels monitored for eye movement.  One channel electrocardiogram was monitored.  Data were obtained, stored, and interpreted according to ACNS guidelines (J Clin Neurophysiol 2006;23(2):85-183) utilizing referential montage recording, with reformatting to longitudinal, transverse bipolar,  and referential montages as necessary for interpretation, along with digital/automated EEG analysis.  Patient tolerated entire procedure well.  Photic stimulation and hyperventilation were utilized as activation procedures unless otherwise specified below. EEG Description Background Activity: posterior dominant rhythm (PRD) was slow 7-8Hz , the predominant background consisted of moderate voltage arrhythmic alpha and delta activity. Brief 1-1.5 second periods of generalized EEG attenuation were also noted. Paroxysmal Abnormalities: None. Drowsiness/Sleep: Fragmented sleep architecture, including sleep spindles and vertex waves, was appreciated during the recording. Stimulation: Hyperventilation and photic stimulation were not performed. ECG Recording: Sinus tachycardia. Recording Quality: Minor muscle, motion, and eye movement  artifacts were occasionally noted. EEG Interpretation Ictal Events: None 2.   Interictal Abnormalities: slowed PDR, background arrhythmic alpha and delta activity. 3.   Non-Epileptic Events: None Clinical Correlation No seizures were recorded. The absence of epileptiform abnormalities does not preclude a clinical diagnosis of seizures.   EEG background findings are suggestive of a moderate encephalopathy but are etiologically nonspecific. Abundant EMG and patient movement artifact may preclude detection of subtle EEG findings.   Electronically signed by: Marisue Humble, MD Page: [phone] 12/27/2021, 10:30 PM    Lab Data:  CBC: Recent Labs  Lab 12/27/21 1354 12/28/21 0509 12/29/21 0648 12/30/21 0139  WBC 19.0* 13.6* 9.4 9.3  NEUTROABS 16.9*  --   --   --   HGB 13.6 13.7 12.8* 12.4*  HCT 39.5 39.9 37.6* 36.0*  MCV 93.4 94.1 93.3 91.4  PLT 327 284 303 276   Basic Metabolic Panel: Recent Labs  Lab 12/27/21 1354 12/28/21 0509 12/29/21 0648 12/30/21 0139  NA 137 138 138 137  K 3.3* 4.4 3.8 3.4*  CL 106 106 106 102  CO2 21* 22 21* 28  GLUCOSE 170* 258* 130* 178*  BUN 8 7  9 7   CREATININE 1.10 1.12 0.97 0.84  CALCIUM 9.2 9.0 8.8* 8.9  MG  --  1.8 1.7 1.9  PHOS  --  2.0* 2.4* 2.6   Liver Function Tests: Recent Labs  Lab 12/27/21 1354  AST 50*  ALT 22  ALKPHOS 67  BILITOT 0.5  PROT 6.6  ALBUMIN 4.0   Recent Labs  Lab 12/29/21 0648  LIPASE 24  AMYLASE 23*   Recent Labs  Lab 12/28/21 0509  AMMONIA 31    Cardiac Enzymes: Recent Labs  Lab 12/27/21 1717  CKTOTAL 1,262*    HbA1C: Recent Labs    12/28/21 0509  HGBA1C 7.0*   CBG: Recent Labs  Lab 12/29/21 1605 12/29/21 2111 12/30/21 0008 12/30/21 0414 12/30/21 0746  GLUCAP 216* 386* 236* 139* 210*    Thyroid Function Tests: Recent Labs    12/27/21 2256  TSH 3.878  FREET4 0.70   Urine analysis:    Component Value Date/Time   COLORURINE YELLOW 12/11/2021 2316   APPEARANCEUR CLEAR 12/11/2021 2316   APPEARANCEUR Clear 01/20/2015 2136   LABSPEC 1.012 12/11/2021 2316   LABSPEC 1.023 01/20/2015 2136   PHURINE 6.0 12/11/2021 2316   GLUCOSEU NEGATIVE 12/11/2021 2316   GLUCOSEU >=500 01/20/2015 2136   HGBUR SMALL (A) 12/11/2021 2316   BILIRUBINUR NEGATIVE 12/11/2021 2316   BILIRUBINUR Negative 01/20/2015 2136   KETONESUR NEGATIVE 12/11/2021 2316   PROTEINUR NEGATIVE 12/11/2021 2316   NITRITE NEGATIVE 12/11/2021 2316   LEUKOCYTESUR NEGATIVE 12/11/2021 2316   LEUKOCYTESUR Negative 01/20/2015 2136     Terrian Ridlon M.D. [facility] 12/30/2021, 12:09 PM  Available via Epic secure chat 7am-7pm After 7 pm, please refer to night coverage provider listed on amion.

## 2021-12-30 NOTE — Evaluation (Signed)
Physical Therapy Evaluation ?Patient Details ?Name: Mark [name] ?MRN: [id number] ?DOB: 1985/12/18 ?Today's Date: 12/30/2021 ? ?History of Present Illness ? 36 y/o male presented to ED on 12/27/21 for seizure and low blood sugar. PMHx: T1DM, epilepsy, bipolar/ GAD, and hydrocephalus.  ?Clinical Impression ? Patient admitted with above. Patient lethargic on arrival requiring repetitive cues to maintain focus. Patient presents with generalized weakness, impaired coordination, decreased activity tolerance, impaired balance and impaired cognition. Patient required minA for OOB mobility this date due to unsteadiness. Defer further mobility 2/2 lethargy. Patient will benefit from skilled PT services during acute stay to address listed deficits. Recommend CIR at this time to maximize functional independence and return to PLOF.  ?   ? ?Recommendations for follow up therapy are one component of a multi-disciplinary discharge planning process, led by the attending physician.  Recommendations may be updated based on patient status, additional functional criteria and insurance authorization. ? ?Follow Up Recommendations Acute inpatient rehab (3hours/day) ? ?  ?Assistance Recommended at Discharge Frequent or constant Supervision/Assistance  ?Patient can return home with the following ? A little help with walking and/or transfers;A lot of help with bathing/dressing/bathroom;Assistance with cooking/housework;Direct supervision/assist for medications management;Direct supervision/assist for financial management;Assist for transportation;Help with stairs or ramp for entrance ? ?  ?Equipment Recommendations Other (comment) (TBD)  ?Recommendations for Other Services ? Rehab consult  ?  ?Functional Status Assessment Patient has had a recent decline in their functional status and demonstrates the ability to make significant improvements in function in a reasonable and predictable amount of time.  ? ?  ?Precautions / Restrictions  Precautions ?Precautions: Fall ?Precaution Comments: seizure, covid + ?Restrictions ?Weight Bearing Restrictions: No  ? ?  ? ?Mobility ? Bed Mobility ?Overal bed mobility: Needs Assistance ?Bed Mobility: Supine to Sit, Sit to Supine ?  ?  ?Supine to sit: Min assist ?  ?  ?General bed mobility comments: min A for posterior LOB ?  ? ?Transfers ?Overall transfer level: Needs assistance ?Equipment used: None ?Transfers: Sit to/from Stand, Bed to chair/wheelchair/BSC ?Sit to Stand: Min guard ?  ?Step pivot transfers: Min assist ?  ?  ?  ?General transfer comment: no AD, pt unsteady requiring min A to correct. Will likely benefit from AD ?  ? ?Ambulation/Gait ?  ?  ?  ?  ?  ?  ?  ?General Gait Details: deferred due to lethargy ? ?Stairs ?  ?  ?  ?  ?  ? ?Wheelchair Mobility ?  ? ?Modified Rankin (Stroke Patients Only) ?  ? ?  ? ?Balance Overall balance assessment: Needs assistance ?Sitting-balance support: Feet supported ?Sitting balance-Leahy Scale: Poor ?Sitting balance - Comments: several posterior LOB when provided another task ?Postural control: Posterior lean ?Standing balance support: No upper extremity supported, During functional activity ?Standing balance-Leahy Scale: Poor ?Standing balance comment: required min A for balance ?  ?  ?  ?  ?  ?  ?  ?  ?  ?  ?  ?   ? ? ? ?Pertinent Vitals/Pain Pain Assessment ?Pain Assessment: No/denies pain  ? ? ?Home Living Family/patient expects to be discharged to:: Private residence ?Living Arrangements: Non-relatives/Friends ?Available Help at Discharge: Friend(s);Available 24 hours/day ?Type of Home: House ?Home Access: Stairs to enter ?  ?Entrance Stairs-Number of Steps: 3 ?  ?Home Layout: One level ?  ?Additional Comments: pt lethargic & provided limited home set up information  ?  ?Prior Function Prior Level of Function : Independent/Modified Independent;Patient poor historian/Family not available ?  ?  ?  ?  ?  ?  ?  Mobility Comments: no AD ?ADLs Comments: indep, does not  work, does not drive ?  ? ? ?Hand Dominance  ? Dominant Hand: Right ? ?  ?Extremity/Trunk Assessment  ? Upper Extremity Assessment ?Upper Extremity Assessment: Defer to OT evaluation ?RUE Deficits / Details: grossly weak 4/5, poor coordination ?RUE Sensation: WNL ?RUE Coordination: decreased fine motor ?LUE Deficits / Details: grossly weak 4/5, poor coordination ?LUE Sensation: WNL ?LUE Coordination: decreased fine motor ?  ? ?Lower Extremity Assessment ?Lower Extremity Assessment: Generalized weakness;RLE deficits/detail;LLE deficits/detail ?RLE Deficits / Details: grossly 4/5, impaired coordination ?RLE Sensation: WNL ?RLE Coordination: decreased gross motor;decreased fine motor ?LLE Deficits / Details: grossly 4/5, impaired coordination ?LLE Sensation: WNL ?LLE Coordination: decreased fine motor;decreased gross motor ?  ? ?Cervical / Trunk Assessment ?Cervical / Trunk Assessment: Normal  ?Communication  ? Communication: Expressive difficulties (slow slurred speech)  ?Cognition Arousal/Alertness: Lethargic ?Behavior During Therapy: Flat affect ?Overall Cognitive Status: No family/caregiver present to determine baseline cognitive functioning ?  ?  ?  ?  ?  ?  ?  ?  ?  ?  ?  ?  ?  ?  ?  ?  ?General Comments: orientedx4, lethargic and required repetitve cues to follow commands. Limited history obtained. get frustrated easily, unable to work through it despite cues ?  ?  ? ?  ?General Comments General comments (skin integrity, edema, etc.): VSS on RA, pt lethargic ? ?  ?Exercises    ? ?Assessment/Plan  ?  ?PT Assessment Patient needs continued PT services  ?PT Problem List Decreased strength;Decreased activity tolerance;Decreased mobility;Decreased coordination;Decreased balance;Decreased cognition;Decreased knowledge of use of DME;Decreased safety awareness;Decreased knowledge of precautions ? ?   ?  ?PT Treatment Interventions DME instruction;Gait training;Functional mobility training;Therapeutic activities;Stair  training;Therapeutic exercise;Balance training;Neuromuscular re-education;Patient/family education   ? ?PT Goals (Current goals can be found in the Care Plan section)  ?Acute Rehab PT Goals ?Patient Stated Goal: did not state ?PT Goal Formulation: Patient unable to participate in goal setting ?Time For Goal Achievement: 01/13/22 ?Potential to Achieve Goals: Good ? ?  ?Frequency Min 3X/week ?  ? ? ?Co-evaluation PT/OT/SLP Co-Evaluation/Treatment: Yes ?Reason for Co-Treatment: Necessary to address cognition/behavior during functional activity ?PT goals addressed during session: Mobility/safety with mobility;Balance ?OT goals addressed during session: ADL's and self-care ?  ? ? ?  ?AM-PAC PT "6 Clicks" Mobility  ?Outcome Measure Help needed turning from your back to your side while in a flat bed without using bedrails?: A Little ?Help needed moving from lying on your back to sitting on the side of a flat bed without using bedrails?: A Little ?Help needed moving to and from a bed to a chair (including a wheelchair)?: A Little ?Help needed standing up from a chair using your arms (e.g., wheelchair or bedside chair)?: A Little ?Help needed to walk in hospital room?: A Lot ?Help needed climbing 3-5 steps with a railing? : Total ?6 Click Score: 15 ? ?  ?End of Session   ?Activity Tolerance: Patient limited by lethargy ?Patient left: in chair;with call bell/phone within reach;with chair alarm set ?Nurse Communication: Mobility status ?PT Visit Diagnosis: Unsteadiness on feet (R26.81);Muscle weakness (generalized) (M62.81);Difficulty in walking, not elsewhere classified (R26.2) ?  ? ?Time: [id number] ?PT Time Calculation (min) (ACUTE ONLY): 15 min ? ? ?Charges:   PT Evaluation ?$PT Eval Moderate Complexity: 1 Mod ?  ?  ?   ? ? ?Lanore Renderos A. Dan HumphreysWalker, PT, DPT ?Acute Rehabilitation Services ?Pager [phone] ?Office [phone] ? ? ?Quamesha Mullet A Zaara Sprowl ?12/30/2021,  12:37 PM ? ?

## 2021-12-30 NOTE — Progress Notes (Signed)
HOSPITAL MEDICINE OVERNIGHT EVENT NOTE   ? ?Notified by nursing that patient has been complaining of "pain all over." ? ?In an effort to come to the nurses station to report his pain he apparently fell twice with nursing reporting that he appears to have struck his head process. ? ?After the fall patient is belligerent, cursing at staff and threatening to leave AGAINST MEDICAL ADVICE unless he gets pain medication. ? ?Chart reviewed, daytime provider reports that while the patient was originally admitted for encephalopathy and seizures, the patient is now awake alert and oriented.  Psychiatry note from 3/10 additionally states the patient is at low risk of self-harm only requiring a routine level of observation. ? ?Patient is mentally at baseline and fortunately cannot be involuntarily committed.  Patient has been advised that it is in his best interest to remain hospitalized as he appears to be a high fall risk.  The patient's complaints of generalized body aches, will order a one-time dose of 15 mg of intravenous Toradol.  For patient's associated agitation and request for something to help him sleep we will additionally administer 25 mg of oral hydroxyzine. ? ?Mark Elk  MD ?[facility]  ? ? ? ? ? ? ? ? ? ? ? ?

## 2021-12-30 NOTE — Progress Notes (Signed)
PT Cancellation Note ? ?Patient Details ?Name: JOSEPHUS CRUTHIRDS ?MRN: [id number] ?DOB: 08-Apr-1986 ? ? ?Cancelled Treatment:    Reason Eval/Treat Not Completed: Patient at procedure or test/unavailable Off unit. Will follow up as time allows. ?Xariah Silvernail A. Gilford Rile, PT, DPT ?Acute Rehabilitation Services ?Pager [phone] ?Office [phone] ? ? ? ?Iza Preston A Calypso Hagarty ?12/30/2021, 11:18 AM ?

## 2021-12-31 LAB — BASIC METABOLIC PANEL
Anion gap: 9 (ref 5–15)
BUN: 9 mg/dL (ref 6–20)
CO2: 28 mmol/L (ref 22–32)
Calcium: 9.1 mg/dL (ref 8.9–10.3)
Chloride: 98 mmol/L (ref 98–111)
Creatinine, Ser: 1.01 mg/dL (ref 0.61–1.24)
GFR, Estimated: >60 mL/min (ref >60)
Glucose, Bld: 344 mg/dL — ABNORMAL HIGH (ref 70–99)
Potassium: 4.3 mmol/L (ref 3.5–5.1)
Sodium: 135 mmol/L (ref 135–145)

## 2021-12-31 LAB — CBC
HCT: 36 % — ABNORMAL LOW (ref 39.0–52.0)
Hemoglobin: 12.4 g/dL — ABNORMAL LOW (ref 13.0–17.0)
MCH: 31.5 pg (ref 26.0–34.0)
MCHC: 34.4 g/dL (ref 30.0–36.0)
MCV: 91.4 fL (ref 80.0–100.0)
Platelets: 267 10*3/uL (ref 150–400)
RBC: 3.94 MIL/uL — ABNORMAL LOW (ref 4.22–5.81)
RDW: 11.9 % (ref 11.5–15.5)
WBC: 8.2 10*3/uL (ref 4.0–10.5)
nRBC: 0 % (ref 0.0–0.2)

## 2021-12-31 LAB — MAGNESIUM: Magnesium: 1.6 mg/dL — ABNORMAL LOW (ref 1.7–2.4)

## 2021-12-31 LAB — PHOSPHORUS: Phosphorus: 3 mg/dL (ref 2.5–4.6)

## 2021-12-31 LAB — GLUCOSE, CAPILLARY
Glucose-Capillary: 395 mg/dL — ABNORMAL HIGH (ref 70–99)
Glucose-Capillary: 220 mg/dL — ABNORMAL HIGH (ref 70–99)
Glucose-Capillary: 456 mg/dL — ABNORMAL HIGH (ref 70–99)
Glucose-Capillary: 405 mg/dL — ABNORMAL HIGH (ref 70–99)

## 2021-12-31 MED ORDER — HALOPERIDOL 0.5 MG PO TABS
2.0000 mg | ORAL_TABLET | Freq: Three times a day (TID) | ORAL | Status: DC | PRN
  Administered 2021-12-31 (×2): 2 mg via ORAL
  Filled 2021-12-31 (×2): qty 4

## 2021-12-31 MED ORDER — INSULIN ASPART 100 UNIT/ML IJ SOLN
6.0000 [IU] | Freq: Once | INTRAMUSCULAR | Status: AC
  Administered 2021-12-31: 6 [IU] via SUBCUTANEOUS

## 2021-12-31 MED ORDER — LORAZEPAM 1 MG PO TABS
1.0000 mg | ORAL_TABLET | ORAL | Status: DC | PRN
  Administered 2021-12-31: 1 mg via ORAL
  Administered 2022-01-01: 2 mg via ORAL
  Filled 2021-12-31: qty 1
  Filled 2021-12-31: qty 2

## 2021-12-31 MED ORDER — INSULIN DETEMIR 100 UNIT/ML ~~LOC~~ SOLN
18.0000 [IU] | Freq: Two times a day (BID) | SUBCUTANEOUS | Status: DC
  Administered 2021-12-31: 18 [IU] via SUBCUTANEOUS
  Filled 2021-12-31 (×2): qty 0.18

## 2021-12-31 MED ORDER — INSULIN ASPART 100 UNIT/ML IJ SOLN
3.0000 [IU] | Freq: Three times a day (TID) | INTRAMUSCULAR | Status: DC
  Administered 2021-12-31 (×2): 3 [IU] via SUBCUTANEOUS

## 2021-12-31 NOTE — Progress Notes (Signed)
Inpatient Rehab Admissions Coordinator:  ?Consult received. Note pt is on airborne/COVID precautions. Attending informed AC precautions will be in place for 10 days (3/18). Attempted to contact pt via phone. Pt did not answer. NSG reported pt was asleep. Will continue to follow. ? ? ?Wolfgang Phoenix, MS, CCC-SLP ?Admissions Coordinator ?[phone] ? ?

## 2021-12-31 NOTE — Progress Notes (Signed)
HOSPITAL MEDICINE OVERNIGHT EVENT NOTE   ? ?QHS sugar 405.  Giving one time order for 6 units of Novolog SQ. ? ?Vernelle Emerald  MD ?[facility]  ? ? ? ? ? ? ? ? ? ? ?

## 2021-12-31 NOTE — Progress Notes (Signed)
During event earlier patient pulled his IV out and ripped telemetry off. Refusing at present to wear tele. Patient did agree to take oral haldol. Video tele removed from room per their request since patient is not an appropriate candidate. At this time patient in bed, ate another food tray per his request and drank a Gatorade from the cart at nurses station.  ?

## 2021-12-31 NOTE — Progress Notes (Signed)
Sent Dr. Isidoro Donning secure chat to make her aware of CBG of 456 and that patient continues to eat and ask for graham crackers and at 2 trays for lunch earlier and that food is the only thing that is seeming to keep him somewhat content with being in the hospital. Waiting for MD to respond.  ?

## 2021-12-31 NOTE — Progress Notes (Signed)
[facility]                                                                              Patient Demographics  Mark [name], is a 36 y.o. male, DOB - 01/28/1986, [id number]:[id number]  Admit date - 12/27/2021   Admitting Physician Lorin Glassaniel C Smith, MD  Outpatient Primary MD for the patient is Rema FendtStephens, Amy J, NP  Outpatient specialists:   LOS - 4  days   Medical records reviewed and are as summarized below:        Brief summary   Per admission history by PCCM on 3/8 Patient is a 36 year old male with history of type 1 diabetes mellitus, epilepsy, bipolar/GAD and hydrocephalus.  EMS was called out for seizure activity, found confused and became agitated and combative in route to ER.  Initial CBG 52, improved after D10 and glucagon. Patient had a recent admission for refractory hypoglycemia 2/20> 11/2020 thought related to poor p.o. intake, [facility] with insulin OOD.  Also noted to have lytic lesion of T7 but unable to cooperate for MRI.  In ED, persistently agitated requiring Ativan and restraints. Hypoglycemic, afebrile but persistently tachycardic.  PCCM was called for admission 3/11: [facility]   Assessment & Plan    Acute metabolic encephalopathy in the setting of seizure activity, hydrocephalus -EEG negative for seizure activity -Was recently started on Wellbutrin which can lower seizure threshold, discontinued and not recommended to restart per psychiatry -Continue Keppra -Started on Depakote 250 mg twice daily by psychiatry -Overnight issues noted, had falls due to imbalance, agitation, states he hit his head. -Currently, denies any pain, alert and oriented, no focal neurological deficits noted. -Psychiatry reconsulted again to readjust his medications, currently on Depakote, Keppra and Haldol as needed.  Perhaps need to resume Seroquel.   History of bipolar disorder, presented with acute  encephalopathy -Appreciate psychiatry recommendations, recommended not to restart bupropion -Seroquel has been held, may need to resume now given his intermittent agitation and outbursts. -Continue Keppra, started Depakote 250mg  twice daily  Lytic lesion T7 incidentally seen on CT C-spine in 11/2021 - on 3/11, discussed with the patient and he agreed for MRI T-spine for further work-up.  However once in the MRI he became agitated and stopped cooperating.  MRI was not done.  Patient will need to follow-up with his PCP and obtain outpatient MRI.  Type 1 diabetes mellitus with refractory hypoglycemia -Hypoglycemia resolved however CBGs now elevated  Recent Labs    12/30/21 0746 12/30/21 1212 12/30/21 1327 12/30/21 1628 12/30/21 2122 12/31/21 0613  GLUCAP 210* 231* 227* 269* 195* 395*    -Increase Levemir to 18 units twice daily, added NovoLog 3 units 3 times daily AC, continue sliding scale insulin. Hemoglobin A1c 7.0 on 3/9  Hypokalemia -Replace as needed  COVID-19 -Incidentally found, asymptomatic, no hypoxia or infiltrates. -Continue to monitor, isolation x 10 days.  Hypothyroidism Continue Synthroid 50 mcg daily  Code Status: Full CODE STATUS DVT Prophylaxis:  enoxaparin (LOVENOX) injection 40 mg Start: 12/27/21 1800 SCDs Start: 12/27/21 1749   Level of Care: Level of care: Progressive Family  Communication:   Disposition Plan:     Status is: Inpatient Remains inpatient appropriate because: Work-up in progress, PT recommended acute inpatient rehab.  CIR consult placed  Procedures:  EEG  Consultants:   Neurology Admitted by CCM  Antimicrobials:   None    Medications  Scheduled Meds:  enoxaparin (LOVENOX) injection  40 mg Subcutaneous Q24H   insulin aspart  0-5 Units Subcutaneous QHS   insulin aspart  0-9 Units Subcutaneous TID WC   insulin detemir  13 Units Subcutaneous BID   levothyroxine  50 mcg Oral Q0600   sodium chloride flush  3 mL Intravenous Q12H    Continuous Infusions:  sodium chloride     levETIRAcetam 1,000 mg (12/31/21 0627)   valproate sodium 250 mg (12/31/21 0659)        Subjective:   [name] was seen and examined today.  Overnight issues noted, currently oriented, calm and cooperative.  States no headache, no focal neurodeficits noted.  Overnight, had outbursts of agitation with falls.   Objective:   Vitals:   12/30/21 1630 12/30/21 2117 12/30/21 2156 12/31/21 0901  BP: 118/89 120/84 130/84 108/71  Pulse: 85 84 84 80  Resp: 16 16 20 18   Temp: 97.8 F (36.6 C) 97.9 F (36.6 C) 98 F (36.7 C) 98.6 F (37 C)  TempSrc: Oral Oral Oral Oral  SpO2: 100% 100% 100% 99%  Weight:      Height:        Intake/Output Summary (Last 24 hours) at 12/31/2021 1115 Last data filed at 12/31/2021 0900 Gross per 24 hour  Intake 1122.5 ml  Output 1000 ml  Net 122.5 ml     Wt Readings from Last 3 Encounters:  12/30/21 92.7 kg  12/13/21 85.5 kg  04/07/21 97.6 kg   Physical Exam General: Alert and oriented x 3, NAD Cardiovascular: S1 S2 clear, RRR. No pedal edema b/l Respiratory: CTAB, no wheezing Gastrointestinal: Soft, nontender, nondistended, NBS Ext: no pedal edema bilaterally Neuro: no new deficits, strength 5/5 in upper and lower extremities   Data Reviewed:  I have personally reviewed following labs and imaging studies  Micro Results Recent Results (from the past 240 hour(s))  Resp Panel by RT-PCR (Flu A&B, Covid) Nasopharyngeal Swab     Status: Abnormal   Collection Time: 12/27/21  7:21 PM   Specimen: Nasopharyngeal Swab; Nasopharyngeal(NP) swabs in vial transport medium  Result Value Ref Range Status   SARS Coronavirus 2 by RT PCR POSITIVE (A) NEGATIVE Final    Comment: (NOTE) SARS-CoV-2 target nucleic acids are DETECTED.  The SARS-CoV-2 RNA is generally detectable in upper respiratory specimens during the acute phase of infection. Positive results are indicative of the presence of the  identified virus, but do not rule out bacterial infection or co-infection with other pathogens not detected by the test. Clinical correlation with patient history and other diagnostic information is necessary to determine patient infection status. The expected result is Negative.  Fact Sheet for Patients: 02/26/22  Fact Sheet for Healthcare Providers: BloggerCourse.com  This test is not yet approved or cleared by the [place] FDA and  has been authorized for detection and/or diagnosis of SARS-CoV-2 by FDA under an Emergency Use Authorization (EUA).  This EUA will remain in effect (meaning this test can be used) for the duration of  the COVID-19 declaration under Section 564(b)(1) of the A ct, 21 U.S.C. section 360bbb-3(b)(1), unless the authorization is terminated or revoked sooner.     Influenza A by PCR NEGATIVE  NEGATIVE Final   Influenza B by PCR NEGATIVE NEGATIVE Final    Comment: (NOTE) The Xpert Xpress SARS-CoV-2/FLU/RSV plus assay is intended as an aid in the diagnosis of influenza from Nasopharyngeal swab specimens and should not be used as a sole basis for treatment. Nasal washings and aspirates are unacceptable for Xpert Xpress SARS-CoV-2/FLU/RSV testing.  Fact Sheet for Patients: BloggerCourse.com  Fact Sheet for Healthcare Providers: SeriousBroker.it  This test is not yet approved or cleared by the [place] FDA and has been authorized for detection and/or diagnosis of SARS-CoV-2 by FDA under an Emergency Use Authorization (EUA). This EUA will remain in effect (meaning this test can be used) for the duration of the COVID-19 declaration under Section 564(b)(1) of the Act, 21 U.S.C. section 360bbb-3(b)(1), unless the authorization is terminated or revoked.  Performed at [facility], [address], [place], [place] [address]   Culture,  blood (routine x 2)     Status: None (Preliminary result)   Collection Time: 12/27/21 10:40 PM   Specimen: BLOOD  Result Value Ref Range Status   Specimen Description BLOOD LEFT ANTECUBITAL  Final   Special Requests   Final    BOTTLES DRAWN AEROBIC AND ANAEROBIC Blood Culture adequate volume   Culture   Final    NO GROWTH 4 DAYS Performed at [facility], [address], [place], [place] [address]    Report Status PENDING  Incomplete  Culture, blood (routine x 2)     Status: None (Preliminary result)   Collection Time: 12/28/21  5:11 AM   Specimen: BLOOD LEFT HAND  Result Value Ref Range Status   Specimen Description BLOOD LEFT HAND  Final   Special Requests AEROBIC BOTTLE ONLY Blood Culture adequate volume  Final   Culture   Final    NO GROWTH 3 DAYS Performed at [facility], [address] [place], [place] [address]    Report Status PENDING  Incomplete    Radiology Reports CT HEAD WO CONTRAST ( )  Result Date: 12/27/2021 IMPRESSION: 1. No acute intracranial process. 2. 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 Electronically Signed   By: Wiliam Ke M.D.   On: 12/27/2021 17:09   CT CERVICAL SPINE WO CONTRAST  Result Date: 12/12/2021  IMPRESSION: 1. No acute fracture or subluxation of the cervical spine. 2. Mild degenerative changes at the level of C4-C5 and C6-C7. 3. Enlarging lytic area within the inferior aspect of the T7 vertebral body, as described above. MRI correlation is recommended. Electronically Signed   By: Aram Candela M.D.   On: 12/12/2021 02:31   DG CHEST PORT 1 VIEW  Result Date: 12/28/2021 IMPRESSION: No evidence of active cardiopulmonary disease. Electronically Signed   By: Jackey Loge D.O.   On: 12/28/2021 08:19     EEG adult  Result Date: 12/27/2021 MD Recording Conditions: This is a routine less than one hour EEG recording.  The EEG was performed utilizing  standard international 10-20 system of electrode placement, with additional channels monitored for eye movement.  One channel electrocardiogram was monitored.  Data were obtained, stored, and interpreted according to ACNS guidelines (J Clin Neurophysiol 2006;23(2):85-183) utilizing referential montage recording, with reformatting to longitudinal, transverse bipolar, and referential montages as necessary for interpretation, along with digital/automated EEG analysis.  Patient tolerated entire procedure well.  Photic stimulation and hyperventilation were utilized as activation procedures unless otherwise specified below. EEG Description Background Activity: posterior dominant rhythm (  PRD) was slow 7-8Hz , the predominant background consisted of moderate voltage arrhythmic alpha and delta activity. Brief 1-1.5 second periods of generalized EEG attenuation were also noted. Paroxysmal Abnormalities: None. Drowsiness/Sleep: Fragmented sleep architecture, including sleep spindles and vertex waves, was appreciated during the recording. Stimulation: Hyperventilation and photic stimulation were not performed. ECG Recording: Sinus tachycardia. Recording Quality: Minor muscle, motion, and eye movement artifacts were occasionally noted. EEG Interpretation Ictal Events: None 2.   Interictal Abnormalities: slowed PDR, background arrhythmic alpha and delta activity. 3.   Non-Epileptic Events: None Clinical Correlation No seizures were recorded. The absence of epileptiform abnormalities does not preclude a clinical diagnosis of seizures.   EEG background findings are suggestive of a moderate encephalopathy but are etiologically nonspecific. Abundant EMG and patient movement artifact may preclude detection of subtle EEG findings.   Electronically signed by: Marisue Humble, MD Page: [phone] 12/27/2021, 10:30 PM    Lab Data:  CBC: Recent Labs  Lab 12/27/21 1354 12/28/21 0509 12/29/21 0648 12/30/21 0139 12/31/21 0045  WBC  19.0* 13.6* 9.4 9.3 8.2  NEUTROABS 16.9*  --   --   --   --   HGB 13.6 13.7 12.8* 12.4* 12.4*  HCT 39.5 39.9 37.6* 36.0* 36.0*  MCV 93.4 94.1 93.3 91.4 91.4  PLT 327 284 303 276 267   Basic Metabolic Panel: Recent Labs  Lab 12/27/21 1354 12/28/21 0509 12/29/21 0648 12/30/21 0139 12/31/21 0045  NA 137 138 138 137 135  K 3.3* 4.4 3.8 3.4* 4.3  CL 106 106 106 102 98  CO2 21* 22 21* 28 28  GLUCOSE 170* 258* 130* 178* 344*  BUN 8 7 9 7 9   CREATININE 1.10 1.12 0.97 0.84 1.01  CALCIUM 9.2 9.0 8.8* 8.9 9.1  MG  --  1.8 1.7 1.9 1.6*  PHOS  --  2.0* 2.4* 2.6 3.0   Liver Function Tests: Recent Labs  Lab 12/27/21 1354  AST 50*  ALT 22  ALKPHOS 67  BILITOT 0.5  PROT 6.6  ALBUMIN 4.0   Recent Labs  Lab 12/29/21 0648  LIPASE 24  AMYLASE 23*   Recent Labs  Lab 12/28/21 0509  AMMONIA 31    Cardiac Enzymes: Recent Labs  Lab 12/27/21 1717  CKTOTAL 1,262*    HbA1C: No results for input(s): HGBA1C in the last 72 hours.  CBG: Recent Labs  Lab 12/30/21 1212 12/30/21 1327 12/30/21 1628 12/30/21 2122 12/31/21 0613  GLUCAP 231* 227* 269* 195* 395*    Urine analysis:    Component Value Date/Time   COLORURINE YELLOW 12/11/2021 2316   APPEARANCEUR CLEAR 12/11/2021 2316   APPEARANCEUR Clear 01/20/2015 2136   LABSPEC 1.012 12/11/2021 2316   LABSPEC 1.023 01/20/2015 2136   PHURINE 6.0 12/11/2021 2316   GLUCOSEU NEGATIVE 12/11/2021 2316   GLUCOSEU >=500 01/20/2015 2136   HGBUR SMALL (A) 12/11/2021 2316   BILIRUBINUR NEGATIVE 12/11/2021 2316   BILIRUBINUR Negative 01/20/2015 2136   KETONESUR NEGATIVE 12/11/2021 2316   PROTEINUR NEGATIVE 12/11/2021 2316   NITRITE NEGATIVE 12/11/2021 2316   LEUKOCYTESUR NEGATIVE 12/11/2021 2316   LEUKOCYTESUR Negative 01/20/2015 2136     Marinus Eicher M.D. [facility] 12/31/2021, 11:15 AM  Available via Epic secure chat 7am-7pm After 7 pm, please refer to night coverage provider listed on amion.

## 2021-12-31 NOTE — Progress Notes (Signed)
Jolayne Haines, RN contacted Dr. Tana Coast to make her aware that patient is violent and fell in room. Did not hit his head. Prior to fall patient was sitting on edge of bed and this RN opened door and instructed patient to not get up. Nurse was trying to get her PPE on to enter [place] room and patient continued to ignore nurses instructions and with very unsteady gate ambulated towards the door and fell.  Patient would not follow instructions. Attempted to assist patient up in room and patient yelled and lifted his arm and fist towards nurse in a hitting motion and told me to not touch him. Patient then proceeded to walk down the hall stating he was leaving. Yelling and cursing at staff to not touch him and will not allow nurse to escort him and stating he is ready to leave. Patient not following instructions. Dr. Karleen Hampshire came to room to see patient. Security convinced patient to walk back to room but would not allow nurse to assist. Patient used wall to balance back to the end of the hallway. Dr. Karleen Hampshire at bedside. Security and police at door.  ?

## 2021-12-31 NOTE — Progress Notes (Signed)
Dr. Isidoro Donning responded back to secure chat and acknowledged CBG of 456 and gave order to give meal coverage and sliding scale coverage with no additional orders.  ?

## 2021-12-31 NOTE — Progress Notes (Signed)
Mark [name] pts friend listed on contact list was called and informed of pts falls. Mark [name] has concerns about pt returning home.Mark [name] lives with patient and he states he has been living with him for 2 years and has helped care for him. He states pt is unstable and is mentally regressing, increased agitation. ?

## 2022-01-01 LAB — CULTURE, BLOOD (ROUTINE X 2)
Culture: NO GROWTH
Special Requests: ADEQUATE

## 2022-01-01 LAB — COMPREHENSIVE METABOLIC PANEL
ALT: 16 U/L (ref 0–44)
AST: 17 U/L (ref 15–41)
Albumin: 3.7 g/dL (ref 3.5–5.0)
Alkaline Phosphatase: 58 U/L (ref 38–126)
Anion gap: 9 (ref 5–15)
BUN: 14 mg/dL (ref 6–20)
CO2: 26 mmol/L (ref 22–32)
Calcium: 9.2 mg/dL (ref 8.9–10.3)
Chloride: 104 mmol/L (ref 98–111)
Creatinine, Ser: 0.99 mg/dL (ref 0.61–1.24)
GFR, Estimated: >60 mL/min (ref >60)
Glucose, Bld: 206 mg/dL — ABNORMAL HIGH (ref 70–99)
Potassium: 4.3 mmol/L (ref 3.5–5.1)
Sodium: 139 mmol/L (ref 135–145)
Total Bilirubin: 0.5 mg/dL (ref 0.3–1.2)
Total Protein: 6.4 g/dL — ABNORMAL LOW (ref 6.5–8.1)

## 2022-01-01 LAB — HEMOGLOBIN A1C
Hgb A1c MFr Bld: 7.5 % — ABNORMAL HIGH (ref 4.8–5.6)
Mean Plasma Glucose: 169 mg/dL

## 2022-01-01 LAB — CBC
HCT: 38.7 % — ABNORMAL LOW (ref 39.0–52.0)
Hemoglobin: 13.5 g/dL (ref 13.0–17.0)
MCH: 32 pg (ref 26.0–34.0)
MCHC: 34.9 g/dL (ref 30.0–36.0)
MCV: 91.7 fL (ref 80.0–100.0)
Platelets: 274 10*3/uL (ref 150–400)
RBC: 4.22 MIL/uL (ref 4.22–5.81)
RDW: 12.1 % (ref 11.5–15.5)
WBC: 8.1 10*3/uL (ref 4.0–10.5)
nRBC: 0 % (ref 0.0–0.2)

## 2022-01-01 LAB — VALPROIC ACID LEVEL: Valproic Acid Lvl: 18 ug/mL — ABNORMAL LOW (ref 50.0–100.0)

## 2022-01-01 LAB — BASIC METABOLIC PANEL
Anion gap: 9 (ref 5–15)
BUN: 13 mg/dL (ref 6–20)
CO2: 27 mmol/L (ref 22–32)
Calcium: 9 mg/dL (ref 8.9–10.3)
Chloride: 103 mmol/L (ref 98–111)
Creatinine, Ser: 1.02 mg/dL (ref 0.61–1.24)
GFR, Estimated: >60 mL/min (ref >60)
Glucose, Bld: 264 mg/dL — ABNORMAL HIGH (ref 70–99)
Potassium: 4.8 mmol/L (ref 3.5–5.1)
Sodium: 139 mmol/L (ref 135–145)

## 2022-01-01 LAB — GLUCOSE, CAPILLARY
Glucose-Capillary: 275 mg/dL — ABNORMAL HIGH (ref 70–99)
Glucose-Capillary: 178 mg/dL — ABNORMAL HIGH (ref 70–99)
Glucose-Capillary: 97 mg/dL (ref 70–99)
Glucose-Capillary: 264 mg/dL — ABNORMAL HIGH (ref 70–99)

## 2022-01-01 LAB — MAGNESIUM: Magnesium: 1.8 mg/dL (ref 1.7–2.4)

## 2022-01-01 LAB — PHOSPHORUS: Phosphorus: 3.6 mg/dL (ref 2.5–4.6)

## 2022-01-01 MED ORDER — DIVALPROEX SODIUM 250 MG PO DR TAB
500.0000 mg | DELAYED_RELEASE_TABLET | Freq: Two times a day (BID) | ORAL | Status: DC
  Administered 2022-01-01 – 2022-01-02 (×2): 500 mg via ORAL
  Filled 2022-01-01 (×2): qty 2

## 2022-01-01 MED ORDER — HALOPERIDOL 0.5 MG PO TABS
2.0000 mg | ORAL_TABLET | Freq: Every day | ORAL | Status: DC
  Administered 2022-01-01: 2 mg via ORAL
  Filled 2022-01-01: qty 4

## 2022-01-01 MED ORDER — ACETAMINOPHEN 325 MG PO TABS
650.0000 mg | ORAL_TABLET | ORAL | Status: DC | PRN
Start: 2022-01-01 — End: 2022-01-02
  Filled 2022-01-01: qty 2

## 2022-01-01 MED ORDER — INSULIN ASPART 100 UNIT/ML IJ SOLN
5.0000 [IU] | Freq: Three times a day (TID) | INTRAMUSCULAR | Status: DC
  Administered 2022-01-01 – 2022-01-02 (×4): 5 [IU] via SUBCUTANEOUS

## 2022-01-01 MED ORDER — DIVALPROEX SODIUM 250 MG PO DR TAB: 250.0000 mg | DELAYED_RELEASE_TABLET | Freq: Two times a day (BID) | ORAL | Status: DC

## 2022-01-01 MED ORDER — INSULIN DETEMIR 100 UNIT/ML ~~LOC~~ SOLN
22.0000 [IU] | Freq: Two times a day (BID) | SUBCUTANEOUS | Status: DC
  Administered 2022-01-01 – 2022-01-02 (×3): 22 [IU] via SUBCUTANEOUS
  Filled 2022-01-01 (×4): qty 0.22

## 2022-01-01 NOTE — Progress Notes (Signed)
Occupational Therapy Treatment ?Patient Details ?Name: Mark [name] ?MRN: [id number] ?DOB: 19-Nov-1985 ?Today's Date: 01/01/2022 ? ? ?History of present illness 36 y/o male presented to ED on 12/27/21 for seizure and low blood sugar. PMHx: T1DM, epilepsy, bipolar/ GAD, and hydrocephalus. ?  ?OT comments ? [name] is progressing incramentally, increased time required due to lethargy and encouraging pt to participate. Pleasant throughout session. Pt required min A for sitting balance and min-mod A +2 for functional ambulation without AD. He is limited by impaired balance, impaired cognition, lethargy and poor activity tolerance. Attempted to motivate pt to complete oral care and further ADLs, pt politely declined. D/c remains appropriate. OT to continue to follow acutely.   ? ?Recommendations for follow up therapy are one component of a multi-disciplinary discharge planning process, led by the attending physician.  Recommendations may be updated based on patient status, additional functional criteria and insurance authorization. ?   ?Follow Up Recommendations ? Acute inpatient rehab (3hours/day)  ?  ?Assistance Recommended at Discharge Frequent or constant Supervision/Assistance  ?Patient can return home with the following ? A little help with walking and/or transfers;A little help with bathing/dressing/bathroom;Assistance with cooking/housework;Direct supervision/assist for medications management;Assist for transportation;Help with stairs or ramp for entrance ?  ?Equipment Recommendations ? BSC/3in1  ?  ?Recommendations for Other Services Rehab consult ? ?  ?Precautions / Restrictions Precautions ?Precautions: Fall ?Precaution Comments: seizure, covid + ?Restrictions ?Weight Bearing Restrictions: No  ? ? ?  ? ?Mobility Bed Mobility ?Overal bed mobility: Needs Assistance ?Bed Mobility: Supine to Sit ?  ?  ?Supine to sit: Min assist ?  ?  ?General bed mobility comments: for steadying and safety - incr time to encourage pt to  get OOB ?  ? ?Transfers ?Overall transfer level: Needs assistance ?Equipment used: 2 person hand held assist ?Transfers: Sit to/from Stand ?Sit to Stand: Min assist, +2 physical assistance, +2 safety/equipment ?  ?  ?  ?  ?  ?General transfer comment: extremely unsteady ?  ?  ?Balance Overall balance assessment: Needs assistance ?Sitting-balance support: Feet supported ?Sitting balance-Leahy Scale: Poor ?  ?  ?Standing balance support: Single extremity supported, During functional activity ?Standing balance-Leahy Scale: Poor ?  ?  ?  ?  ?  ?  ?  ?  ?  ?  ?  ?  ?   ? ?ADL either performed or assessed with clinical judgement  ? ?ADL Overall ADL's : Needs assistance/impaired ?  ?  ?  ?Grooming Details (indicate cue type and reason): attempted to encourage oral hygeien, pt declined ?  ?  ?  ?  ?  ?  ?  ?  ?Toilet Transfer: Minimal assistance;Moderate assistance;+2 for physical assistance;+2 for safety/equipment;Ambulation ?Toilet Transfer Details (indicate cue type and reason): no AD but pt extremely unsteady adn required at least 1 UE supported externally on furniture ?  ?  ?  ?  ?Functional mobility during ADLs: Moderate assistance;+2 for physical assistance;+2 for safety/equipment ?General ADL Comments: unable to encourage pt to participate in further ADLs this session ?  ? ?Extremity/Trunk Assessment Upper Extremity Assessment ?Upper Extremity Assessment: RUE deficits/detail;LUE deficits/detail ?RUE Deficits / Details: grossly weak 4/5, poor coordination ?RUE Coordination: decreased fine motor ?LUE Deficits / Details: grossly weak 4/5, poor coordination ?LUE Sensation: WNL ?LUE Coordination: decreased fine motor ?  ?Lower Extremity Assessment ?Lower Extremity Assessment: Defer to PT evaluation ?  ?  ?  ? ?Vision   ?Vision Assessment?: No apparent visual deficits ?  ?Perception Perception ?Perception:  Not tested ?  ?Praxis Praxis ?Praxis: Not tested ?  ? ?Cognition Arousal/Alertness: Lethargic ?Behavior During  Therapy: Flat affect ?Overall Cognitive Status: Impaired/Different from baseline ?Area of Impairment: Attention, Following commands, Memory, Safety/judgement, Awareness, Problem solving ?  ?  ?  ?  ?  ?  ?  ?  ?  ?Current Attention Level: Focused ?Memory: Decreased short-term memory ?Following Commands: Follows one step commands inconsistently, Follows one step commands with increased time ?Safety/Judgement: Decreased awareness of safety, Decreased awareness of deficits ?Awareness: Intellectual ?Problem Solving: Slow processing, Decreased initiation, Requires tactile cues, Requires verbal cues, Difficulty sequencing ?General Comments: pt extremely lethargic. poor attention, required max encouragement to participate. pleasant. llimited insight to deficits and safety ?  ?  ?   ?Exercises   ? ?  ?Shoulder Instructions   ? ? ?  ?General Comments VSS on RA  ? ? ?Pertinent Vitals/ Pain       Pain Assessment ?Pain Assessment: Faces ?Faces Pain Scale: No hurt ?Pain Intervention(s): Monitored during session ? ?Home Living   ?  ?  ?  ?  ?  ?  ?  ?  ?  ?  ?  ?  ?  ?  ?  ?  ?  ?  ? ?  ?Prior Functioning/Environment    ?  ?  ?  ?   ? ?Frequency ? Min 2X/week  ? ? ? ? ?  ?Progress Toward Goals ? ?OT Goals(current goals can now be found in the care plan section) ? Progress towards OT goals: Progressing toward goals ? ?Acute Rehab OT Goals ?Patient Stated Goal: did not state ?OT Goal Formulation: With patient ?Time For Goal Achievement: 01/13/22 ?Potential to Achieve Goals: Good ?ADL Goals ?Pt Will Perform Grooming: with modified independence;standing ?Pt Will Perform Lower Body Dressing: with modified independence;sit to/from stand ?Pt Will Transfer to Toilet: with modified independence;ambulating ?Additional ADL Goal #1: Pt will indep complete IADL medication management task ?Additional ADL Goal #2: Pt will follow 3 step directional task independently as a precursor to safe IADLs  ?Plan Discharge plan remains appropriate    ? ?Co-evaluation ? ? ? PT/OT/SLP Co-Evaluation/Treatment: Yes ?Reason for Co-Treatment: Complexity of the patient's impairments (multi-system involvement);To address functional/ADL transfers ?  ?OT goals addressed during session: ADL's and self-care ?  ? ?  ?AM-PAC OT "6 Clicks" Daily Activity     ?Outcome Measure ? ? Help from another person eating meals?: A Little ?Help from another person taking care of personal grooming?: A Little ?Help from another person toileting, which includes using toliet, bedpan, or urinal?: A Little ?Help from another person bathing (including washing, rinsing, drying)?: A Lot ?Help from another person to put on and taking off regular upper body clothing?: A Little ?Help from another person to put on and taking off regular lower body clothing?: A Lot ?6 Click Score: 16 ? ?  ?End of Session   ? ?OT Visit Diagnosis: Unsteadiness on feet (R26.81);Other abnormalities of gait and mobility (R26.89);Muscle weakness (generalized) (M62.81) ?  ?Activity Tolerance Patient tolerated treatment well ?  ?Patient Left in chair;with call bell/phone within reach;with chair alarm set ?  ?Nurse Communication Mobility status ?  ? ?   ? ?Time: [id number] ?OT Time Calculation (min): 24 min ? ?Charges: OT General Charges ?$OT Visit: 1 Visit ?OT Treatments ?$Therapeutic Activity: 8-22 mins ? ? ?Cantrell Larouche A Stacye Noori ?01/01/2022, 5:28 PM ?

## 2022-01-01 NOTE — Progress Notes (Signed)
Sleeping most of the time, but arousal. Able to finish lunch. ?

## 2022-01-01 NOTE — Progress Notes (Signed)
Inpatient Diabetes Program Recommendations ? ?AACE/ADA: New Consensus Statement on Inpatient Glycemic Control (2015) ? ?Target Ranges:  Prepandial:   less than 140 mg/dL ?     Peak postprandial:   less than 180 mg/dL (1-2 hours) ?     Critically ill patients:  140 - 180 mg/dL  ? ?Lab Results  ?Component Value Date  ? GLUCAP 275 (H) 01/01/2022  ? HGBA1C 7.5 (H) 12/30/2021  ? ? ?Review of Glycemic Control ? Latest Reference Range & Units 12/31/21 18:00 12/31/21 20:22 01/01/22 06:05  ?Glucose-Capillary 70 - 99 mg/dL 568 (H) 127 (H) 517 (H)  ?(H): Data is abnormally high ?Diabetes history: Type 1 DM ?Outpatient Diabetes medications: Novolin 70/30 45 B/35 L/25 D ?Current orders for Inpatient glycemic control: Novolog 0-9 units TID & Hs, Levemir 22 units BID, Novolog 5 units TID ?  ?Inpatient Diabetes Program Recommendations:   ? ?Noted consult. Assuming hyperglycemia related to increased CHO intake with lack of safe CHO coverage. Intake seems sporadic and inconsistent. Patient inappropriate for education. In agreement with current orders and increase to Levemir.   ?Following.  ? ?Thanks, ?Lujean Rave, MSN, RNC-OB ?Diabetes Coordinator ?[phone] (8a-5p) ? ? ? ? ?

## 2022-01-01 NOTE — Progress Notes (Signed)
[facility]                                                                              Patient Demographics  Mark [name], is a 36 y.o. male, DOB - 11/23/1985, ZOX:[id number]  Admit date - 12/27/2021   Admitting Physician Lorin Glassaniel C Smith, MD  Outpatient Primary MD for the patient is Rema FendtStephens, Amy J, NP  Outpatient specialists:   LOS - 5  days   Medical records reviewed and are as summarized below:        Brief summary   Per admission history by PCCM on 3/8 Patient is a 36 year old male with history of type 1 diabetes mellitus, epilepsy, bipolar/GAD and hydrocephalus.  EMS was called out for seizure activity, found confused and became agitated and combative in route to ER.  Initial CBG 52, improved after D10 and glucagon. Patient had a recent admission for refractory hypoglycemia 2/20> 11/2020 thought related to poor p.o. intake, [facility] with insulin OOD.  Also noted to have lytic lesion of T7 but unable to cooperate for MRI.  In ED, persistently agitated requiring Ativan and restraints. Hypoglycemic, afebrile but persistently tachycardic.  PCCM was called for admission 3/11: [facility], assumed care   Assessment & Plan    Acute metabolic encephalopathy in the setting of seizure activity, hydrocephalus and, bipolar disorder, baseline anger outbursts -EEG negative for seizure activity -Was recently started on Wellbutrin which can lower seizure threshold, discontinued and not recommended to restart per psychiatry -Continue Keppra -Depakote level 18, Depakote increased to 500 mg twice daily by psychiatry -Follow EKG, plan to taper off Keppra once Depakote level therapeutic.  Will discuss with neurology.   History of bipolar disorder, presented with acute encephalopathy -Appreciate psychiatry recommendations, recommended not to restart bupropion -Continue Keppra, increase Depakote to 500 mg twice daily -Added Haldol 2 mg  at bedtime and oral as needed for agitation  Lytic lesion T7 incidentally seen on CT C-spine in 11/2021 - on 3/11, discussed with the patient and he agreed for MRI T-spine for further work-up.  However once in the MRI he became agitated and stopped cooperating.  MRI was not done.  Patient will need to follow-up with his PCP and obtain outpatient MRI.  Type 1 diabetes mellitus with refractory hypoglycemia -Hypoglycemia resolved however CBGs now elevated, patient eating graham crackers and double trays Recent Labs    12/31/21 0613 12/31/21 1133 12/31/21 1800 12/31/21 2022 01/01/22 0605 01/01/22 1331  GLUCAP 395* 220* 456* 405* 275* 178*     -Increase Levemir to 22 units twice daily, increase to NovoLog to 5 units 3 times daily AC, continue sliding scale insulin Hemoglobin A1c 7.0 on 3/9 -Diabetic coordinator consulted   Hypokalemia -Replace as needed  COVID-19 -Incidentally found, asymptomatic, no hypoxia or infiltrates. -Continue to monitor, isolation x 10 days.  Hypothyroidism Continue Synthroid 50 mcg daily  Code Status: Full CODE STATUS DVT Prophylaxis:  enoxaparin (LOVENOX) injection 40 mg Start: 12/27/21 1800 SCDs Start: 12/27/21 1749   Level of Care: Level of care: Progressive Family Communication:   Disposition Plan:     Status  is: Inpatient Remains inpatient appropriate because: Work-up in progress, PT recommended acute inpatient rehab.  CIR consult placed, however patient currently in isolation  Procedures:  EEG  Consultants:   Neurology Admitted by CCM Psychiatry  Antimicrobials:   None    Medications  Scheduled Meds:  divalproex  250 mg Oral Q12H   enoxaparin (LOVENOX) injection  40 mg Subcutaneous Q24H   haloperidol  2 mg Oral QHS   insulin aspart  0-5 Units Subcutaneous QHS   insulin aspart  0-9 Units Subcutaneous TID WC   insulin aspart  5 Units Subcutaneous TID WC   insulin detemir  22 Units Subcutaneous BID   levothyroxine  50 mcg Oral  Q0600   sodium chloride flush  3 mL Intravenous Q12H   Continuous Infusions:  sodium chloride     levETIRAcetam 1,000 mg (01/01/22 0505)        Subjective:   Mark [name] was seen and examined today.  Patient much more calm today, yesterday had intermittent agitation, violent towards the staff during the day.  Objective:   Vitals:   01/01/22 0400 01/01/22 0800 01/01/22 1000 01/01/22 1200  BP: 111/69  107/76   Pulse: 81  81   Resp: 18  18   Temp: 99.3 F (37.4 C)  98.8 F (37.1 C)   TempSrc: Axillary  Oral   SpO2: 96% 98%  98%  Weight: 89.7 kg     Height:        Intake/Output Summary (Last 24 hours) at 01/01/2022 1445 Last data filed at 01/01/2022 0403 Gross per 24 hour  Intake 1424.99 ml  Output 5050 ml  Net -3625.01 ml     Wt Readings from Last 3 Encounters:  01/01/22 89.7 kg  12/13/21 85.5 kg  04/07/21 97.6 kg   Physical Exam General: Alert and oriented x 3, NAD Cardiovascular: S1 S2 clear, RRR. No pedal edema b/l Respiratory: CTAB Gastrointestinal: Soft, nontender, nondistended, NBS Ext: no pedal edema bilaterally Neuro: no new deficits   Data Reviewed:  I have personally reviewed following labs and imaging studies  Micro Results Recent Results (from the past 240 hour(s))  Resp Panel by RT-PCR (Flu A&B, Covid) Nasopharyngeal Swab     Status: Abnormal   Collection Time: 12/27/21  7:21 PM   Specimen: Nasopharyngeal Swab; Nasopharyngeal(NP) swabs in vial transport medium  Result Value Ref Range Status   SARS Coronavirus 2 by RT PCR POSITIVE (A) NEGATIVE Final    Comment: (NOTE) SARS-CoV-2 target nucleic acids are DETECTED.  The SARS-CoV-2 RNA is generally detectable in upper respiratory specimens during the acute phase of infection. Positive results are indicative of the presence of the identified virus, but do not rule out bacterial infection or co-infection with other pathogens not detected by the test. Clinical correlation with patient history  and other diagnostic information is necessary to determine patient infection status. The expected result is Negative.  Fact Sheet for Patients: BloggerCourse.com  Fact Sheet for Healthcare Providers: SeriousBroker.it  This test is not yet approved or cleared by the [place] FDA and  has been authorized for detection and/or diagnosis of SARS-CoV-2 by FDA under an Emergency Use Authorization (EUA).  This EUA will remain in effect (meaning this test can be used) for the duration of  the COVID-19 declaration under Section 564(b)(1) of the A ct, 21 U.S.C. section 360bbb-3(b)(1), unless the authorization is terminated or revoked sooner.     Influenza A by PCR NEGATIVE NEGATIVE Final   Influenza B by PCR NEGATIVE  NEGATIVE Final    Comment: (NOTE) The Xpert Xpress SARS-CoV-2/FLU/RSV plus assay is intended as an aid in the diagnosis of influenza from Nasopharyngeal swab specimens and should not be used as a sole basis for treatment. Nasal washings and aspirates are unacceptable for Xpert Xpress SARS-CoV-2/FLU/RSV testing.  Fact Sheet for Patients: BloggerCourse.com  Fact Sheet for Healthcare Providers: SeriousBroker.it  This test is not yet approved or cleared by the [place] FDA and has been authorized for detection and/or diagnosis of SARS-CoV-2 by FDA under an Emergency Use Authorization (EUA). This EUA will remain in effect (meaning this test can be used) for the duration of the COVID-19 declaration under Section 564(b)(1) of the Act, 21 U.S.C. section 360bbb-3(b)(1), unless the authorization is terminated or revoked.  Performed at [facility], [address] [place], [place] [address]   Culture, blood (routine x 2)     Status: None   Collection Time: 12/27/21 10:40 PM   Specimen: BLOOD  Result Value Ref Range Status   Specimen Description BLOOD LEFT  ANTECUBITAL  Final   Special Requests   Final    BOTTLES DRAWN AEROBIC AND ANAEROBIC Blood Culture adequate volume   Culture   Final    NO GROWTH 5 DAYS Performed at [facility], [address] [place], [place] [address]    Report Status 01/01/2022 FINAL  Final  Culture, blood (routine x 2)     Status: None (Preliminary result)   Collection Time: 12/28/21  5:11 AM   Specimen: BLOOD LEFT HAND  Result Value Ref Range Status   Specimen Description BLOOD LEFT HAND  Final   Special Requests AEROBIC BOTTLE ONLY Blood Culture adequate volume  Final   Culture   Final    NO GROWTH 4 DAYS Performed at [facility], [address] [place], [place] [address]    Report Status PENDING  Incomplete    Radiology Reports CT HEAD WO CONTRAST ( )  Result Date: 12/27/2021 IMPRESSION: 1. No acute intracranial process. 2. 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 Electronically Signed   By: Wiliam Ke M.D.   On: 12/27/2021 17:09   CT CERVICAL SPINE WO CONTRAST  Result Date: 12/12/2021  IMPRESSION: 1. No acute fracture or subluxation of the cervical spine. 2. Mild degenerative changes at the level of C4-C5 and C6-C7. 3. Enlarging lytic area within the inferior aspect of the T7 vertebral body, as described above. MRI correlation is recommended. Electronically Signed   By: Aram Candela M.D.   On: 12/12/2021 02:31   DG CHEST PORT 1 VIEW  Result Date: 12/28/2021 IMPRESSION: No evidence of active cardiopulmonary disease. Electronically Signed   By: Jackey Loge D.O.   On: 12/28/2021 08:19     EEG adult  Result Date: 12/27/2021 MD Recording Conditions: This is a routine less than one hour EEG recording.  The EEG was performed utilizing standard international 10-20 system of electrode placement, with additional channels monitored for eye movement.  One channel electrocardiogram was monitored.  Data were obtained,  stored, and interpreted according to ACNS guidelines (J Clin Neurophysiol 2006;23(2):85-183) utilizing referential montage recording, with reformatting to longitudinal, transverse bipolar, and referential montages as necessary for interpretation, along with digital/automated EEG analysis.  Patient tolerated entire procedure well.  Photic stimulation and hyperventilation were utilized as activation procedures unless otherwise specified below. EEG Description Background Activity: posterior dominant rhythm (PRD) was slow 7-8Hz , the predominant background consisted of moderate  voltage arrhythmic alpha and delta activity. Brief 1-1.5 second periods of generalized EEG attenuation were also noted. Paroxysmal Abnormalities: None. Drowsiness/Sleep: Fragmented sleep architecture, including sleep spindles and vertex waves, was appreciated during the recording. Stimulation: Hyperventilation and photic stimulation were not performed. ECG Recording: Sinus tachycardia. Recording Quality: Minor muscle, motion, and eye movement artifacts were occasionally noted. EEG Interpretation Ictal Events: None 2.   Interictal Abnormalities: slowed PDR, background arrhythmic alpha and delta activity. 3.   Non-Epileptic Events: None Clinical Correlation No seizures were recorded. The absence of epileptiform abnormalities does not preclude a clinical diagnosis of seizures.   EEG background findings are suggestive of a moderate encephalopathy but are etiologically nonspecific. Abundant EMG and patient movement artifact may preclude detection of subtle EEG findings.   Electronically signed by: Marisue Humble, MD Page: [phone] 12/27/2021, 10:30 PM    Lab Data:  CBC: Recent Labs  Lab 12/27/21 1354 12/28/21 0509 12/29/21 0648 12/30/21 0139 12/31/21 0045 01/01/22 0605  WBC 19.0* 13.6* 9.4 9.3 8.2 8.1  NEUTROABS 16.9*  --   --   --   --   --   HGB 13.6 13.7 12.8* 12.4* 12.4* 13.5  HCT 39.5 39.9 37.6* 36.0* 36.0* 38.7*  MCV 93.4 94.1  93.3 91.4 91.4 91.7  PLT 327 284 303 276 267 274   Basic Metabolic Panel: Recent Labs  Lab 12/28/21 0509 12/29/21 0648 12/30/21 0139 12/31/21 0045 01/01/22 0605 01/01/22 1033  NA 138 138 137 135 139 139  K 4.4 3.8 3.4* 4.3 4.8 4.3  CL 106 106 102 98 103 104  CO2 22 21* 28 28 27 26   GLUCOSE 258* 130* 178* 344* 264* 206*  BUN 7 9 7 9 13 14   CREATININE 1.12 0.97 0.84 1.01 1.02 0.99  CALCIUM 9.0 8.8* 8.9 9.1 9.0 9.2  MG 1.8 1.7 1.9 1.6* 1.8  --   PHOS 2.0* 2.4* 2.6 3.0 3.6  --    Liver Function Tests: Recent Labs  Lab 12/27/21 1354 01/01/22 1033  AST 50* 17  ALT 22 16  ALKPHOS 67 58  BILITOT 0.5 0.5  PROT 6.6 6.4*  ALBUMIN 4.0 3.7   Recent Labs  Lab 12/29/21 0648  LIPASE 24  AMYLASE 23*   Recent Labs  Lab 12/28/21 0509  AMMONIA 31    Cardiac Enzymes: Recent Labs  Lab 12/27/21 1717  CKTOTAL 1,262*    HbA1C: Recent Labs    12/30/21 0139  HGBA1C 7.5*    CBG: Recent Labs  Lab 12/31/21 1133 12/31/21 1800 12/31/21 2022 01/01/22 0605 01/01/22 1331  GLUCAP 220* 456* 405* 275* 178*    Urine analysis:    Component Value Date/Time   COLORURINE YELLOW 12/11/2021 2316   APPEARANCEUR CLEAR 12/11/2021 2316   APPEARANCEUR Clear 01/20/2015 2136   LABSPEC 1.012 12/11/2021 2316   LABSPEC 1.023 01/20/2015 2136   PHURINE 6.0 12/11/2021 2316   GLUCOSEU NEGATIVE 12/11/2021 2316   GLUCOSEU >=500 01/20/2015 2136   HGBUR SMALL (A) 12/11/2021 2316   BILIRUBINUR NEGATIVE 12/11/2021 2316   BILIRUBINUR Negative 01/20/2015 2136   KETONESUR NEGATIVE 12/11/2021 2316   PROTEINUR NEGATIVE 12/11/2021 2316   NITRITE NEGATIVE 12/11/2021 2316   LEUKOCYTESUR NEGATIVE 12/11/2021 2316   LEUKOCYTESUR Negative 01/20/2015 2136     Gaspard Isbell M.D. [facility] 01/01/2022, 2:45 PM  Available via Epic secure chat 7am-7pm After 7 pm, please refer to night coverage provider listed on amion.
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
## 2022-01-01 NOTE — Consult Note (Shared)
Pt seen in AM. He is oriented to month and year and situation. He remembers getting up and running in the hall yesterday but not why. Has been eating well. Slept OK last night. Has been moody and angry last night and [name] states it is worse now than usual. Denies AH/VH, intrusive thoughts, racing thoughts, loud internal thoughts, etc. No thoughts of wanting to hurt himself. No belly pain or pain anywhere. CAM-ICU with 1 mistake (1 pound >2 pounds). He has no questions or concerns. [name] did not visit over the weekend. Reviewed importance of staying in room when COVID positive. Does know he is getting haldol every night   Last EKG 3/8.

## 2022-01-01 NOTE — Progress Notes (Signed)
Physical Therapy Treatment ?Patient Details ?Name: [name] ?MRN: [id number] ?DOB: 08/21/1986 ?Today's Date: 01/01/2022 ? ? ?History of Present Illness 36 y/o male presented to ED on 12/27/21 for seizure and low blood sugar. PMHx: T1DM, epilepsy, bipolar/ GAD, and hydrocephalus. ? ?  ?PT Comments  ? ? Pt was lethargic and not wanting to do anything, but with maximal encouragement, pt transitioned to EOB, worked on sit to stand safety and progressed around the bed to the chair, needing min for basic mobility and mod of 2 for safety ambulating around the bed to the chair. ?   ?Recommendations for follow up therapy are one component of a multi-disciplinary discharge planning process, led by the attending physician.  Recommendations may be updated based on patient status, additional functional criteria and insurance authorization. ? ?Follow Up Recommendations ? Acute inpatient rehab (3hours/day) ?  ?  ?Assistance Recommended at Discharge Frequent or constant Supervision/Assistance  ?Patient can return home with the following A little help with walking and/or transfers;A lot of help with bathing/dressing/bathroom;Assistance with cooking/housework;Direct supervision/assist for medications management;Direct supervision/assist for financial management;Assist for transportation;Help with stairs or ramp for entrance ?  ?Equipment Recommendations ? Other (comment) (TBD)  ?  ?Recommendations for Other Services   ? ? ?  ?Precautions / Restrictions Precautions ?Precautions: Fall ?Precaution Comments: seizure, covid + ?Restrictions ?Weight Bearing Restrictions: No  ?  ? ?Mobility ? Bed Mobility ?Overal bed mobility: Needs Assistance ?Bed Mobility: Supine to Sit ?  ?  ?Supine to sit: Min assist ?  ?  ?General bed mobility comments: for steadying and safety - incr time to encourage pt to get OOB ?  ? ?Transfers ?Overall transfer level: Needs assistance ?Equipment used: 2 person hand held assist ?Transfers: Sit to/from Stand ?Sit  to Stand: Min assist, +2 physical assistance, +2 safety/equipment ?  ?  ?  ?  ?  ?General transfer comment: extremely unsteady ?  ? ?Ambulation/Gait ?Ambulation/Gait assistance: Mod assist, +2 physical assistance ?Gait Distance (Feet): 12 Feet ?Assistive device: 1 person hand held assist, 2 person hand held assist ?Gait Pattern/deviations: Step-to pattern, Step-through pattern ?  ?Gait velocity interpretation: <1.31 ft/sec, indicative of household ambulator ?  ?General Gait Details: ambulated around the bed with unsteady staggered steps, hanging onto/sliding on the foot board to maintain balance.  Mod assist of 1 person while other therapist was managing iv pole and guarding for safety. ? ? ?Stairs ?  ?  ?  ?  ?  ? ? ?Wheelchair Mobility ?  ? ?Modified Rankin (Stroke Patients Only) ?  ? ? ?  ?Balance Overall balance assessment: Needs assistance ?Sitting-balance support: Feet supported ?Sitting balance-Leahy Scale: Poor ?Sitting balance - Comments: several posterior LOB ?  ?Standing balance support: Single extremity supported, During functional activity ?Standing balance-Leahy Scale: Poor ?Standing balance comment: reliant on exeternal support ?  ?  ?  ?  ?  ?  ?  ?  ?  ?  ?  ?  ? ?  ?Cognition Arousal/Alertness: Lethargic ?Behavior During Therapy: Flat affect ?Overall Cognitive Status: Impaired/Different from baseline ?Area of Impairment: Attention, Following commands, Memory, Safety/judgement, Awareness, Problem solving ?  ?  ?  ?  ?  ?  ?  ?  ?  ?Current Attention Level: Focused ?Memory: Decreased short-term memory ?Following Commands: Follows one step commands inconsistently, Follows one step commands with increased time ?Safety/Judgement: Decreased awareness of safety, Decreased awareness of deficits ?Awareness: Intellectual ?Problem Solving: Slow processing, Decreased initiation, Requires tactile cues, Requires verbal cues,  Difficulty sequencing ?General Comments: pt extremely lethargic. poor attention,  required max encouragement to participate. pleasant. llimited insight to deficits and safety ?  ?  ? ?  ?Exercises   ? ?  ?General Comments General comments (skin integrity, edema, etc.): VSS on RA ?  ?  ? ?Pertinent Vitals/Pain Pain Assessment ?Pain Assessment: Faces ?Faces Pain Scale: No hurt ?Pain Intervention(s): Monitored during session  ? ? ?Home Living   ?  ?  ?  ?  ?  ?  ?  ?  ?  ?   ?  ?Prior Function    ?  ?  ?   ? ?PT Goals (current goals can now be found in the care plan section) Acute Rehab PT Goals ?PT Goal Formulation: Patient unable to participate in goal setting ?Time For Goal Achievement: 01/13/22 ?Potential to Achieve Goals: Good ?Progress towards PT goals: Progressing toward goals ? ?  ?Frequency ? ? ? Min 3X/week ? ? ? ?  ?PT Plan Current plan remains appropriate  ? ? ?Co-evaluation PT/OT/SLP Co-Evaluation/Treatment: Yes ?Reason for Co-Treatment: Complexity of the patient's impairments (multi-system involvement) ?PT goals addressed during session: Mobility/safety with mobility ?OT goals addressed during session: ADL's and self-care ?  ? ?  ?AM-PAC PT "6 Clicks" Mobility   ?Outcome Measure ? Help needed turning from your back to your side while in a flat bed without using bedrails?: A Little ?Help needed moving from lying on your back to sitting on the side of a flat bed without using bedrails?: A Little ?Help needed moving to and from a bed to a chair (including a wheelchair)?: A Lot ?Help needed standing up from a chair using your arms (e.g., wheelchair or bedside chair)?: A Little ?Help needed to walk in hospital room?: A Lot ?Help needed climbing 3-5 steps with a railing? : Total ?6 Click Score: 14 ? ?  ?End of Session   ?Activity Tolerance: Patient limited by lethargy;Patient tolerated treatment well ?Patient left: in chair;with call bell/phone within reach;with chair alarm set ?Nurse Communication: Mobility status ?PT Visit Diagnosis: Unsteadiness on feet (R26.81);Muscle weakness  (generalized) (M62.81);Difficulty in walking, not elsewhere classified (R26.2) ?  ? ? ?Time: 8546-2703 ?PT Time Calculation (min) (ACUTE ONLY): 24 min ? ?Charges:  $Therapeutic Activity: 8-22 mins          ?          ? ?01/01/2022 ? ?Jacinto Halim., PT ?Acute Rehabilitation Services ?[phone]  (pager) ?[phone]  (office) ? ? ?Eliseo Gum Marvia Troost ?01/01/2022, 6:02 PM ? ?

## 2022-01-01 NOTE — Progress Notes (Addendum)
Speech Language Pathology Treatment: Dysphagia  ?Patient Details ?Name: [name] ?MRN: [id number] ?DOB: August 20, 1986 ?Today's Date: 01/01/2022 ?Time: 5271-2929 ?SLP Time Calculation (min) (ACUTE ONLY): 12 min ? ?Assessment / Plan / Recommendation ?Clinical Impression ? F/u after 3/10 swallowing assessment. Breath sounds have been clear; on room air.  CXR 3/9 unremarkable. Breakfast tray was empty upon entering room. Pt was sleepy but rousable with effort - he consumed thin liquids and regular solids with occasional delayed throat clear/cough, potentially due to being awakened out of deep sleep.  Pt has been on regular diet through the weekend without difficulty per RN report. Has had good appetite and staff report no concerns.  No further swallowing needs identified.  Continue regular tray, thin liquids. ?After leaving the room noted that cognitive assessment was ordered on 3/10- SLP will f/u for this assessment.  ?  ?HPI HPI: 36 yo with prior hx significant for T1DM, epilepsy, bipolar/ GAD, and hydrocephalus, psychotic disorder at baseline. Admitted for seizure activity, found confused and became agitated and combative enroute to ER. Metabolic encephalopathy. MBS 08/2020 rec'd Dys 3/thin silent aspiration with consecutive straw sips ?  ?   ?SLP Plan ? All goals met ? ?  ?  ?Recommendations for follow up therapy are one component of a multi-disciplinary discharge planning process, led by the attending physician.  Recommendations may be updated based on patient status, additional functional criteria and insurance authorization. ?  ? ?Recommendations  ?Diet recommendations: Regular;Thin liquid ?Liquids provided via: Cup;Straw ?Medication Administration: Whole meds with puree ?Supervision: Patient able to self feed ?Compensations: Small sips/bites  ?   ?    ?   ? ? ? ? Oral Care Recommendations: Oral care BID ?Follow Up Recommendations: No SLP follow up ?Assistance recommended at discharge: None ?SLP Visit Diagnosis:  Dysphagia, unspecified (R13.10) ?Plan: All goals met ? ? ? ? ?  ?  ? ?Kellan Boehlke L. Arayna Illescas, MA CCC/SLP ?Acute Rehabilitation Services ?Office number [phone] ?Pager [phone] ? ?[name] ? ?01/01/2022, 11:46 AM ?

## 2022-01-01 NOTE — Progress Notes (Signed)
Complained of pain on iv site when starting kepra iv. Iv line removed, iv team consult made . Awaiting for them to come and insert another line. ?

## 2022-01-01 NOTE — Progress Notes (Signed)
Inpatient Rehab Admissions Coordinator:  ?Due to airborne/COVID precautions called pt on telephone. Pt did not answer. NSG informed AC pt has been medicated. Will continue to follow. ? ?Wolfgang Phoenix, MS, CCC-SLP ?Admissions Coordinator ?[phone] ? ?

## 2022-01-02 ENCOUNTER — Other Ambulatory Visit: Payer: Self-pay

## 2022-01-02 ENCOUNTER — Encounter (HOSPITAL_COMMUNITY): Payer: Self-pay

## 2022-01-02 ENCOUNTER — Emergency Department (HOSPITAL_COMMUNITY)
Admission: EM | Admit: 2022-01-02 | Discharge: 2022-01-02 | Discharge status: Against Medical Advice | Payer: Medicaid Other | Attending: Student | Admitting: Student

## 2022-01-02 ENCOUNTER — Emergency Department (HOSPITAL_COMMUNITY)
Admission: EM | Admit: 2022-01-02 | Discharge: 2022-01-03 | Disposition: A | Discharge status: Home / Self Care | Payer: Medicaid Other | Source: Home / Self Care | Attending: Emergency Medicine | Admitting: Emergency Medicine

## 2022-01-02 DIAGNOSIS — M791 Myalgia, unspecified site: Secondary | ICD-10-CM | POA: Insufficient documentation

## 2022-01-02 DIAGNOSIS — E109 Type 1 diabetes mellitus without complications: Secondary | ICD-10-CM | POA: Insufficient documentation

## 2022-01-02 DIAGNOSIS — R569 Unspecified convulsions: Secondary | ICD-10-CM | POA: Insufficient documentation

## 2022-01-02 DIAGNOSIS — Z5900 Homelessness unspecified: Secondary | ICD-10-CM | POA: Insufficient documentation

## 2022-01-02 DIAGNOSIS — Z79899 Other long term (current) drug therapy: Secondary | ICD-10-CM | POA: Insufficient documentation

## 2022-01-02 DIAGNOSIS — G919 Hydrocephalus, unspecified: Secondary | ICD-10-CM | POA: Insufficient documentation

## 2022-01-02 DIAGNOSIS — E119 Type 2 diabetes mellitus without complications: Secondary | ICD-10-CM | POA: Insufficient documentation

## 2022-01-02 DIAGNOSIS — E039 Hypothyroidism, unspecified: Secondary | ICD-10-CM | POA: Insufficient documentation

## 2022-01-02 DIAGNOSIS — Z794 Long term (current) use of insulin: Secondary | ICD-10-CM | POA: Insufficient documentation

## 2022-01-02 DIAGNOSIS — Z5321 Procedure and treatment not carried out due to patient leaving prior to being seen by health care provider: Secondary | ICD-10-CM | POA: Insufficient documentation

## 2022-01-02 DIAGNOSIS — Z87898 Personal history of other specified conditions: Secondary | ICD-10-CM

## 2022-01-02 LAB — COMPREHENSIVE METABOLIC PANEL
ALT: 17 U/L (ref 0–44)
AST: 23 U/L (ref 15–41)
Albumin: 4.5 g/dL (ref 3.5–5.0)
Alkaline Phosphatase: 80 U/L (ref 38–126)
Anion gap: 8 (ref 5–15)
BUN: 14 mg/dL (ref 6–20)
CO2: 29 mmol/L (ref 22–32)
Calcium: 9.9 mg/dL (ref 8.9–10.3)
Chloride: 98 mmol/L (ref 98–111)
Creatinine, Ser: 1.17 mg/dL (ref 0.61–1.24)
GFR, Estimated: >60 mL/min (ref >60)
Glucose, Bld: 222 mg/dL — ABNORMAL HIGH (ref 70–99)
Potassium: 5.1 mmol/L (ref 3.5–5.1)
Sodium: 135 mmol/L (ref 135–145)
Total Bilirubin: 0.4 mg/dL (ref 0.3–1.2)
Total Protein: 8 g/dL (ref 6.5–8.1)

## 2022-01-02 LAB — GLUCOSE, CAPILLARY
Glucose-Capillary: 129 mg/dL — ABNORMAL HIGH (ref 70–99)
Glucose-Capillary: 81 mg/dL (ref 70–99)

## 2022-01-02 LAB — CBC WITH DIFFERENTIAL/PLATELET
Abs Immature Granulocytes: 0.03 10*3/uL (ref 0.00–0.07)
Basophils Absolute: 0.1 10*3/uL (ref 0.0–0.1)
Basophils Relative: 1 %
Eosinophils Absolute: 0.4 10*3/uL (ref 0.0–0.5)
Eosinophils Relative: 4 %
HCT: 44.9 % (ref 39.0–52.0)
Hemoglobin: 15 g/dL (ref 13.0–17.0)
Immature Granulocytes: 0 %
Lymphocytes Relative: 19 %
Lymphs Abs: 1.8 10*3/uL (ref 0.7–4.0)
MCH: 31.8 pg (ref 26.0–34.0)
MCHC: 33.4 g/dL (ref 30.0–36.0)
MCV: 95.1 fL (ref 80.0–100.0)
Monocytes Absolute: 0.5 10*3/uL (ref 0.1–1.0)
Monocytes Relative: 6 %
Neutro Abs: 6.8 10*3/uL (ref 1.7–7.7)
Neutrophils Relative %: 70 %
Platelets: 311 10*3/uL (ref 150–400)
RBC: 4.72 MIL/uL (ref 4.22–5.81)
RDW: 11.9 % (ref 11.5–15.5)
WBC: 9.6 10*3/uL (ref 4.0–10.5)
nRBC: 0 % (ref 0.0–0.2)

## 2022-01-02 LAB — CULTURE, BLOOD (ROUTINE X 2)
Culture: NO GROWTH
Special Requests: ADEQUATE

## 2022-01-02 NOTE — Discharge Instructions (Addendum)
Your labs from earlier tonight were reassuring.  ?Your blood sugar was mildly elevated- please monitor this at home and follow up with primary care.  ? ?Please take your medications as prescribed.  ?Follow up with primary care, behavioral health, and neurology as soon as possible. Do not drive/operate heavy machinery until cleared by neurology. Return to the ER for new or worsening symptoms or any other concerns.  ?
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
## 2022-01-02 NOTE — Progress Notes (Signed)
SLP Cancellation Note ? ?Patient Details ?Name: Mark [name] ?MRN: [id number] ?DOB: 03-03-1986 ? ? ?Cancelled assessment: upon review of pt's chart, behavior/cognition are being overseen by psychiatry at this time - there is little SLP can offer in assessment process that would complement his current care. Our service will sign off. ? ?[place] Lozito L. Naiya Corral, MA CCC/SLP ?Acute Rehabilitation Services ?Office number [phone] ?Pager [phone] ?        ? ? ?Blenda Mounts Laurice ?01/02/2022, 1:22 PM ?

## 2022-01-02 NOTE — Progress Notes (Signed)
?      ? ? ? [facility] ?                                                                            ? ?Patient Demographics ? ?Mark [name], is a 36 y.o. male, DOB - 08/13/1986, YSA:[id number] ? ?Admit date - 12/27/2021   Admitting Physician Lorin Glassaniel C Smith, MD ? ?Outpatient Primary MD for the patient is Rema FendtStephens, Amy J, NP ? ?Outpatient specialists:  ? ?LOS - 6  days  ? ?Medical records reviewed and are as summarized below: ? ? ?    ? ?Brief summary  ? ?Per admission history by PCCM on 3/8 ?Patient is a 36 year old male with history of type 1 diabetes mellitus, epilepsy, bipolar/GAD and hydrocephalus.  EMS was called out for seizure activity, found confused and became agitated and combative in route to ER.  Initial CBG 52, improved after D10 and glucagon. ?Patient had a recent admission for refractory hypoglycemia 2/20> 11/2020 thought related to poor p.o. intake, [facility] O[facility].  Also noted to have lytic lesion of T7 but unable to cooperate for MRI.  In ED, persistently agitated requiring Ativan and restraints. ?Hypoglycemic, afebrile but persistently tachycardic.  PCCM was called for admission ?[facility]: [facility] ? ? ?Assessment & Plan  ? ? ?Acute metabolic encephalopathy in the setting of seizure activity, hydrocephalus and, bipolar disorder, baseline anger outbursts ?-EEG negative for seizure activity ?-Was recently started on Wellbutrin which can lower seizure threshold, discontinued and not recommended to restart per psychiatry ?-Continue Keppra ?-Depakote level 18, Depakote increased to 500 mg twice daily by psychiatry, will recheck Depakote level in a.m. ?-Obtain EKG.  Plan to taper off Keppra once Depakote level is therapeutic, discussed with neurology, Dr. Amada JupiterKirkpatrick, will follow  ? ? ?History of bipolar disorder, presented with acute encephalopathy ?-Appreciate psychiatry recommendations, recommended not to restart bupropion ?-Continue  Keppra, increase Depakote to 500 mg twice daily, will follow Depakote level in a.m. ?-Added Haldol 2 mg at bedtime and oral as needed for agitation ? ?Lytic lesion T7 incidentally seen on CT C-spine in 11/2021 ?- on 3/11, discussed with the patient and he agreed for MRI T-spine for further work-up.  However once in the MRI he became agitated and stopped cooperating.  MRI was not done.  ?- Patient will need to follow-up with his PCP and obtain outpatient MRI. ? ?Type 1 diabetes mellitus with refractory hypoglycemia ?-Hypoglycemia resolved however CBGs now elevated ?Recent Labs  ?  01/01/22 ?0605 01/01/22 ?1331 01/01/22 ?1814 01/01/22 ?2125 01/02/22 ?0615 01/02/22 ?1343  ?GLUCAP 275* 178* 97 264* 129* 81  ?  ? -Continue Levemir 22 units twice daily, NovoLog 5 units 3 times daily AC, SSI ?-Hemoglobin A1c 7.0 on 3/9 ?-Diabetic coordinator consulted ? ? ?Hypokalemia ?-Replace as needed ? ?COVID-19 ?-Incidentally found, asymptomatic, no hypoxia or infiltrates. ?-Continue to monitor, isolation x 10 days. ? ?Hypothyroidism ?Continue Synthroid 50 mcg daily ? ?Code Status: Full CODE STATUS ?DVT Prophylaxis:  enoxaparin (LOVENOX) injection 40 mg Start: 12/27/21 1800 ?SCDs Start: 12/27/21 1749 ? ? ?Level of Care: Level of care: Progressive ?Family Communication:  ? ?Disposition Plan:  Status is: Inpatient ?Remains inpatient appropriate because: Work-up in progress, PT recommended acute inpatient rehab.  CIR consult placed, however patient currently in isolation ? ?Procedures:  ?EEG ? ?Consultants:   ?Neurology ?Admitted by CCM ?Psychiatry ? ?Antimicrobials:  ? ?None  ? ? ?Medications ? ?Scheduled Meds: ? divalproex  500 mg Oral Q12H  ? enoxaparin (LOVENOX) injection  40 mg Subcutaneous Q24H  ? haloperidol  2 mg Oral QHS  ? insulin aspart  0-5 Units Subcutaneous QHS  ? insulin aspart  0-9 Units Subcutaneous TID WC  ? insulin aspart  5 Units Subcutaneous TID WC  ? insulin detemir  22 Units Subcutaneous BID  ? levothyroxine  50  mcg Oral Q0600  ? sodium chloride flush  3 mL Intravenous Q12H  ? ?Continuous Infusions: ? sodium chloride    ? levETIRAcetam 1,000 mg (01/02/22 0503)  ? ? ? ? ? ? ?Subjective:  ? ?Mark [name] was seen and examined today.  Much more, today.  No acute complaints.  No nausea vomiting, abdominal pain, or any diarrhea.  No hypoxia. ? ?Objective:  ? ?Vitals:  ? 01/01/22 1948 01/02/22 0333 01/02/22 0500 01/02/22 0841  ?BP: 112/68 108/76  97/67  ?Pulse: 87 74  81  ?Resp: 18 16  16   ?Temp: 98.7 ?F (37.1 ?C) 97.9 ?F (36.6 ?C)    ?TempSrc: Oral Oral    ?SpO2: 100% 99%  99%  ?Weight:   89.4 kg   ?Height:      ? ? ?Intake/Output Summary (Last 24 hours) at 01/02/2022 1444 ?Last data filed at 01/02/2022 0900 ?Gross per 24 hour  ?Intake 440 ml  ?Output --  ?Net 440 ml  ? ? ? ?Wt Readings from Last 3 Encounters:  ?01/02/22 89.4 kg  ?12/13/21 85.5 kg  ?04/07/21 97.6 kg  ? ?Physical Exam ?General: Alert and oriented x 3, NAD ?Cardiovascular: S1 S2 clear, RRR. No pedal edema b/l ?Respiratory: CTAB, no wheezing, rales or rhonchi ?Gastrointestinal: Soft, nontender, nondistended, NBS ?Ext: no pedal edema bilaterally ?Neuro: no new deficits ? ? ?Data Reviewed:  I have personally reviewed following labs and imaging studies ? ?Micro Results ?Recent Results (from the past 240 hour(s))  ?Resp Panel by RT-PCR (Flu A&B, Covid) Nasopharyngeal Swab     Status: Abnormal  ? Collection Time: 12/27/21  7:21 PM  ? Specimen: Nasopharyngeal Swab; Nasopharyngeal(NP) swabs in vial transport medium  ?Result Value Ref Range Status  ? SARS Coronavirus 2 by RT PCR POSITIVE (A) NEGATIVE Final  ?  Comment: (NOTE) ?SARS-CoV-2 target nucleic acids are DETECTED. ? ?The SARS-CoV-2 RNA is generally detectable in upper respiratory ?specimens during the acute phase of infection. Positive results are ?indicative of the presence of the identified virus, but do not rule ?out bacterial infection or co-infection with other pathogens not ?detected by the test. Clinical  correlation with patient history and ?other diagnostic information is necessary to determine patient ?infection status. The expected result is Negative. ? ?Fact Sheet for Patients: ?02/26/22 ? ?Fact Sheet for Healthcare Providers: ?BloggerCourse.com ? ?This test is not yet approved or cleared by the [place] FDA and  ?has been authorized for detection and/or diagnosis of SARS-CoV-2 by ?FDA under an Emergency Use Authorization (EUA).  This EUA will ?remain in effect (meaning this test can be used) for the duration of  ?the COVID-19 declaration under Section 564(b)(1) of the A ct, 21 ?U.S.C. section 360bbb-3(b)(1), unless the authorization is ?terminated or revoked sooner. ? ?  ? Influenza A by PCR NEGATIVE NEGATIVE  Final  ? Influenza B by PCR NEGATIVE NEGATIVE Final  ?  Comment: (NOTE) ?The Xpert Xpress SARS-CoV-2/FLU/RSV plus assay is intended as an aid ?in the diagnosis of influenza from Nasopharyngeal swab specimens and ?should not be used as a sole basis for treatment. Nasal washings and ?aspirates are unacceptable for Xpert Xpress SARS-CoV-2/FLU/RSV ?testing. ? ?Fact Sheet for Patients: ?BloggerCourse.com ? ?Fact Sheet for Healthcare Providers: ?[employer].it ? ?This test is not yet approved or cleared by the [place] FDA and ?has been authorized for detection and/or diagnosis of SARS-CoV-2 by ?FDA under an Emergency Use Authorization (EUA). This EUA will remain ?in effect (meaning this test can be used) for the duration of the ?COVID-19 declaration under Section 564(b)(1) of the Act, 21 U.S.C. ?section 360bbb-3(b)(1), unless the authorization is terminated or ?revoked. ? ?Performed at [facility], [address], [place], [place] ?[address] ?  ?Culture, blood (routine x 2)     Status: None  ? Collection Time: 12/27/21 10:40 PM  ? Specimen: BLOOD  ?Result Value Ref Range Status  ?  Specimen Description BLOOD LEFT ANTECUBITAL  Final  ? Special Requests   Final  ?  BOTTLES DRAWN AEROBIC AND ANAEROBIC Blood Culture adequate volume  ? Culture   Final  ?  NO GROWTH 5 DAYS ?Performed at [facility]
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
## 2022-01-02 NOTE — ED Notes (Signed)
Patient has been continuously yelling at staff stating he wants to leave and go home. Pt states that he does not have a ride home. Staff told pt that security can escort him to the bus stop. Pt refuses.  ?

## 2022-01-02 NOTE — ED Provider Triage Note (Signed)
Emergency Medicine Provider Triage Evaluation Note ? ?Mark [name] , a 36 y.o. male  was evaluated in triage.  Pt who left AMA from admission here today for acute metabolic encephalopathy.  He returns via EMS with complains of generalized body pain worse in his back.  Patient verbally aggressive with EMS and initially resistant to any evaluation in triage.  Refused CBG.  History of epilepsy and type 1 diabetes. ? ?Review of Systems  ?Positive: Myalgias ?Negative: Chest pain, shortness of breath, palpitations, abdominal pain ? ?Physical Exam  ?BP (!) 121/91 (BP Location: Right Arm)   Pulse 94   Temp (!) 97.3 ?F (36.3 ?C) (Oral)   Resp 16   SpO2 100%  ?Gen:   Awake, no distress   ?Resp:  Normal effort  ?MSK:   Moves extremities without difficulty  ?Other:  RRR no/R/G.  Lungs CTA B. ? ?Medical Decision Making  ?Medically screening exam initiated at 6:45 PM.  Appropriate orders placed.  Mark [name] was informed that the remainder of the evaluation will be completed by another provider, this initial triage assessment does not replace that evaluation, and the importance of remaining in the ED until their evaluation is complete. ? ?This chart was dictated using voice recognition software, Dragon. Despite the best efforts of this provider to proofread and correct errors, errors may still occur which can change documentation meaning. ? ?  ?Paris Lore, PA-C ?01/02/22 1846 ? ?

## 2022-01-02 NOTE — ED Notes (Signed)
Called to the lobby to assist patient. Triage nurse speaking with the patient. He had left AMA earlier and attempted to ambulate to bus stop. Cursing and yelling. Pt fell into NT then onto his butt, refused to be evaulated, angry that no one will wheel him to the bus stop. Pt again attempted to ambulate out of doors and nearly fell again.Security and NT took pt to bus stop her request  ?

## 2022-01-02 NOTE — Progress Notes (Signed)
PT Cancellation Note ? ?Patient Details ?Name: Mark [name] ?MRN: [id number] ?DOB: 08/03/1986 ? ? ?Cancelled Treatment:    Reason Eval/Treat Not Completed: Other (comment).  Declines to get OOB and to even work on LE strengthening in bed.  Retry at another time. ? ? ?Ivar Drape ?01/02/2022, 10:57 AM ? ?Samul Dada, PT PhD ?Acute Rehab Dept. Number: [facility] [phone] and [facility] [phone] ? ?

## 2022-01-02 NOTE — Discharge Summary (Signed)
?AMA NOTE  ? ? ?Patient ID: ?Mark [name] ?MRN: [id number] ?DOB/AGE: June 25, 1986 36 y.o. ? ?Admit date: 12/27/2021 ?Discharge date: 01/02/2022  ? ?PLEASE NOTE THAT PATIENT LEFT AGAINST MEDICAL ADVICE. Risks of worsening symptoms, seizures, psychosis were explained in detail to the patient and he verbally understood the risks of leaving AMA.  ? ?Primary Care Physician:  Rema Fendt, NP ? ?Discharge Diagnoses:   ? ? Acute metabolic encephalopathy ?Seizure disorder ?Hydrocephalus ?Bipolar disorder with intermittent anger outbursts  ?Lytic lesion T7 incidentally seen on CT C spine in 11/2021 ?Type1 DM, uncontrolled  ?Medical noncompliance  ?Hypokalemia  ?Incidental Covid-19, asymptomatic  ?Hypothyroidism  ? ? ? ?Consults:   ?Admitted by PCCM  ?Psychiatry  ?Neurology  ? ?Recommendations for Outpatient Follow-up:  ?Patient left AMA  ? ? ? ? ?DIET: carb modified diet  ? ? ? ?Allergies: ?  ?Allergies  ?Allergen Reactions  ? Mushroom Extract Complex Hives, Itching and Nausea And Vomiting  ? Penicillins Anaphylaxis, Hives and Swelling  ?  Has patient had a PCN reaction causing immediate rash, facial/tongue/throat swelling, SOB or lightheadedness with hypotension: Yes ?Has patient had a PCN reaction causing severe rash involving mucus membranes or skin necrosis: No ?Has patient had a PCN reaction that required hospitalization: Yes ?Has patient had a PCN reaction occurring within the last 10 years: Yes ?If all of the above answers are "NO", then may proceed with Cephalosporin use. ?Tolerated ceftriaxone 03/24/20  ? Sulfa Antibiotics Anaphylaxis and Hives  ? Clindamycin/Lincomycin Hives  ? ? ? ?Discharge Medications: ?Please note that patient left AMA (against medical advice) ? ? ?Brief H and P: ?For complete details please refer to admission H and P, but in brief Per admission history by PCCM on 3/8 ?Patient is a 36 year old male with history of type 1 diabetes mellitus, epilepsy, bipolar/GAD and hydrocephalus.  EMS was called  out for seizure activity, found confused and became agitated and combative in route to ER.  Initial CBG 52, improved after D10 and glucagon. ?Patient had a recent admission for refractory hypoglycemia 2/20> 11/2020 thought related to poor p.o. intake, prior SI attempt at [facility] in 2019 with insulin OOD.  Also noted to have lytic lesion of T7 but unable to cooperate for MRI.  In ED, persistently agitated requiring Ativan and restraints. ?Hypoglycemic, afebrile but persistently tachycardic.  PCCM was called for admission ?3/11: Patient transferred to [facility], assumed care ?  ? ?Hospital Course:  ? ?Acute metabolic encephalopathy in the setting of seizure activity, hydrocephalus and, bipolar disorder, baseline anger outbursts ?-EEG negative for seizure activity ?-Was recently started on Wellbutrin which can lower seizure threshold, discontinued and not recommended to restart per psychiatry ?-Continue Keppra ?-Depakote level 18, Depakote increased to 500 mg twice daily by psychiatry, plan was to recheck Depakote level in a.m. ?-Plan was to taper off Keppra once Depakote level is therapeutic, discussed with neurology, Dr. Amada Jupiter ?Notified by RN that patient was triggered by something  again and became hostile, screaming at the staff. I had spoken with psychiatry on 3/13 and per his roommate, patient has intermittent angry outbursts close to his baseline mental status. He is fully oriented and understands the risks of worsening symptoms, seizure and bipolar ds due to his non compliance.  ?Patient demanded to leave AMA and signed out against medical advice.   ?  ? ?History of bipolar disorder, presented with acute encephalopathy ?-Appreciate psychiatry recommendations, recommended not to restart bupropion ?-Continue Keppra, increase Depakote to 500 mg twice daily, plan  was to follow Depakote level in a.m. ?-Added Haldol 2 mg at bedtime and oral as needed for agitation. His agitation was improving and now close to baseline.   ?  ?Lytic lesion T7 incidentally seen on CT C-spine in 11/2021 ?- on 3/11, discussed with the patient and he agreed for MRI T-spine for further work-up.  However once in the MRI he became agitated and stopped cooperating.  MRI was not done.  ?- Patient will need to follow-up with his PCP and obtain outpatient MRI. ?  ?Type 1 diabetes mellitus with refractory hypoglycemia ?-Hypoglycemia resolved however CBGs now elevated ?Recent Labs (last 2 labs)   ?        ?Recent Labs  ?  01/01/22 ?0605 01/01/22 ?1331 01/01/22 ?1814 01/01/22 ?2125 01/02/22 ?0615 01/02/22 ?1343  ?GLUCAP 275* 178* 97 264* 129* 81  ?  ?  ? -Continue Levemir 22 units twice daily, NovoLog 5 units 3 times daily AC, SSI ?-Hemoglobin A1c 7.0 on 3/9 ?-Diabetic coordinator was consulted ?  ?  ?Hypokalemia ?-Replace as needed ?  ?COVID-19 ?-Incidentally found, asymptomatic, no hypoxia or infiltrates. ?-Continue to monitor, isolation x 10 days. ?  ?Hypothyroidism ?Continue Synthroid 50 mcg daily ? ?Day of Discharge ?BP 97/67 (BP Location: Left Arm)   Pulse 81   Temp 97.9 ?F (36.6 ?C) (Oral)   Resp 16   Ht 6\' 2"  (1.88 m)   Wt 89.4 kg   SpO2 99%   BMI 25.31 kg/m?  ? ?Physical Exam: today AM ?General: Alert and awake oriented x3 not in any acute distress. ?CVS: S1-S2 clear no murmur rubs or gallops ?Chest: clear to auscultation bilaterally, no wheezing rales or rhonchi ?Abdomen: soft nontender, nondistended, normal bowel sounds ?Extremities: no cyanosis, clubbing or edema noted bilaterally ?Neuro: no new focal neurological deficits ? ? ?The results of significant diagnostics from this hospitalization (including imaging, microbiology, ancillary and laboratory) are listed below for reference.   ? ?LAB RESULTS: ?Basic Metabolic Panel: ?Recent Labs  ?Lab 01/01/22 ?0605 01/01/22 ?1033  ?NA 139 139  ?K 4.8 4.3  ?CL 103 104  ?CO2 27 26  ?GLUCOSE 264* 206*  ?BUN 13 14  ?CREATININE 1.02 0.99  ?CALCIUM 9.0 9.2  ?MG 1.8  --   ?PHOS 3.6  --   ? ?Liver Function  Tests: ?Recent Labs  ?Lab 12/27/21 ?1354 01/01/22 ?1033  ?AST 50* 17  ?ALT 22 16  ?ALKPHOS 67 58  ?BILITOT 0.5 0.5  ?PROT 6.6 6.4*  ?ALBUMIN 4.0 3.7  ? ?Recent Labs  ?Lab 12/29/21 ?0648  ?LIPASE 24  ?AMYLASE 23*  ? ?Recent Labs  ?Lab 12/28/21 ?[id number]  ?AMMONIA 31  ? ?CBC: ?Recent Labs  ?Lab 12/27/21 ?1354 12/28/21 ?[id number] 12/31/21 ?0045 01/01/22 ?[id number]  ?WBC 19.0*   < > 8.2 8.1  ?NEUTROABS 16.9*  --   --   --   ?HGB 13.6   < > 12.4* 13.5  ?HCT 39.5   < > 36.0* 38.7*  ?MCV 93.4   < > 91.4 91.7  ?PLT 327   < > 267 274  ? < > = values in this interval not displayed.  ? ?Cardiac Enzymes: ?Recent Labs  ?Lab 12/27/21 ?1717  ?CKTOTAL 1,262*  ? ?BNP: ?Invalid input(s): POCBNP ?CBG: ?Recent Labs  ?Lab 01/02/22 ?0615 01/02/22 ?1343  ?GLUCAP 129* 81  ? ? ? ? ? ? ?DISPOSITION: Patient left AMA. He was advised to seek follow-up with primary care physician.   ? ? ?DISCHARGE FOLLOW-UP ?patient left AMA ? ? ?  Signed: ? ? ?Thad Ranger M.D. ?[facility] ?01/02/2022, 7:07 PM ? ? ? ?  ?

## 2022-01-02 NOTE — Progress Notes (Signed)
Pt called out to nurses station irate, cussing stating "I want to leave right now!!" When nurse went to assess patient he was cussing and throwing things in the room and wouldn't state why he wanted to leave or what upset him just that "I am sick of being here". Paged Dr. Tana Coast who agreed that patient is at his baseline and able to make his own decisions so he signed out AMA. After signing the AMA paper, pt threatened to punch staff that were there to help him collect his belongings and patient fell 3 times on the way out, he would not let staff near him to assist him and security was called to assist with him and keep staff safe and wheel him to the bus stop. ?

## 2022-01-02 NOTE — ED Provider Notes (Signed)
?[facility] ?Provider Note ? ? ?CSN: [id number] ?Arrival date & time: 01/02/22  2124 ? ?  ? ?History ? ?Chief Complaint  ?Patient presents with  ? Seizures  ? ? ?Mark [name] is a 36 y.o. male with a hx of diabetes mellitus, bipolar disorder, anxiety, depression, hypothyroidism, hydrocephalus S/p surgical intervention, and homelessness who returns to the ED stating he was told he should get rechecked out. Patient states he had a seizure that lead to him being hospitalized, he might have had one last night while in the hospital, but ultimately left this morning because he did not feel he needed to stay anymore. He states he was told by someone he should come be re-evaluated which is why he checked back in today.  He overall feels okay. He denies seizure activity today. Denies pain, headache, vision change, numbness, weakness, fever, chest pain, dyspnea, or abdominal pain. Reports he does have a place to stay.  ? ?Chart reviewed for additional hx:  ?Recent admission 12/27/21- 01/02/22 from which he left AMA.  ?He initially presented S/p seizure activity and presented with confusion & agitation, became combative, was hypoglycemic which improved with d10 & glucagon, persistently tachycardic and was ultimately admitted for acute metabolic encephalopathy. EEG negative for seizure activity, discontinued wellbutrin, and plan was to taper off of Keppra once Depakote level therapeutic, however patient became hostile with angry outbursts and ultimately demanded to leave AMA.  ? ?HPI ? ?  ? ?Home Medications ?Prior to Admission medications   ?Medication Sig Start Date End Date Taking? Authorizing Provider  ?Blood Glucose Monitoring Suppl (ACCU-CHEK GUIDE) w/Device KIT 1 Device by Does not apply route daily. 12/13/21   Madalyn Rob, MD  ?buPROPion (WELLBUTRIN XL) 150 MG 24 hr tablet Take 1 tablet (150 mg total) by mouth every morning. Patient to take 1 tablet (150 mg total) for 6 days  then 2 tablets (300 mg total)  daily ?Patient not taking: Reported on 12/29/2021 12/13/21 12/13/22  Madalyn Rob, MD  ?escitalopram (LEXAPRO) 20 MG tablet Take 1 tablet (20 mg total) by mouth daily. ?Patient not taking: Reported on 12/29/2021 12/13/21 01/12/22  Madalyn Rob, MD  ?glucose blood (ACCU-CHEK GUIDE) test strip Check blood glucose 4 times daily.Before breakfast,lunch and dinner. Also before bed time. 12/13/21   Madalyn Rob, MD  ?insulin isophane & regular human KwikPen (NOVOLIN 70/30 KWIKPEN) (70-30) 100 UNIT/ML KwikPen Inject 45 Units into the skin daily before breakfast AND 35 Units daily before lunch AND 25 Units daily before supper. 12/13/21   Madalyn Rob, MD  ?Accu-Chek Softclix Lancets lancets Check blood glucose 3 times a day before meals and before bedtime. 12/13/21   Madalyn Rob, MD  ?levETIRAcetam (KEPPRA) 500 MG tablet Take 1 tablet (500 mg total) by mouth 2 (two) times daily. ?Patient not taking: Reported on 12/29/2021 12/13/21   Madalyn Rob, MD  ?levothyroxine (SYNTHROID) 50 MCG tablet Take 1 tablet (50 mcg total) by mouth daily before breakfast. ?Patient not taking: Reported on 12/29/2021 12/14/21   Madalyn Rob, MD  ?QUEtiapine (SEROQUEL) 200 MG tablet Take 2 tablets (400 mg total) by mouth at bedtime. ?Patient not taking: Reported on 12/29/2021 12/13/21 01/12/22  Madalyn Rob, MD  ?   ? ?Allergies    ?Mushroom extract complex, Penicillins, Sulfa antibiotics, and Clindamycin/lincomycin   ? ?Review of Systems   ?Review of Systems  ?Constitutional:  Negative for chills and fever.  ?Eyes:  Negative for visual disturbance.  ?Respiratory:  Negative for cough and  shortness of breath.   ?Cardiovascular:  Negative for chest pain.  ?Gastrointestinal:  Negative for abdominal pain, diarrhea, nausea and vomiting.  ?Neurological:  Positive for seizures (none today). Negative for dizziness, speech difficulty, weakness, numbness and headaches.  ?All other systems reviewed and are negative. ? ?Physical Exam ?Updated  Vital Signs ?BP 116/83   Pulse 99   Temp (!) 97.5 ?F (36.4 ?C) (Oral)   Resp 16   SpO2 99%  ?Physical Exam ?Vitals and nursing note reviewed.  ?Constitutional:   ?   General: He is not in acute distress. ?   Appearance: Normal appearance. He is not toxic-appearing.  ?HENT:  ?   Head: Normocephalic and atraumatic.  ?   Mouth/Throat:  ?   Pharynx: Oropharynx is clear. Uvula midline.  ?Eyes:  ?   General: Vision grossly intact. Gaze aligned appropriately.  ?   Extraocular Movements: Extraocular movements intact.  ?   Conjunctiva/sclera: Conjunctivae normal.  ?   Pupils: Pupils are equal, round, and reactive to light.  ?   Comments: No proptosis.   ?Cardiovascular:  ?   Rate and Rhythm: Normal rate and regular rhythm.  ?Pulmonary:  ?   Effort: Pulmonary effort is normal.  ?   Breath sounds: Normal breath sounds.  ?Abdominal:  ?   General: There is no distension.  ?   Palpations: Abdomen is soft.  ?   Tenderness: There is no abdominal tenderness. There is no guarding or rebound.  ?Musculoskeletal:  ?   Cervical back: Normal range of motion and neck supple. No rigidity.  ?Skin: ?   General: Skin is warm and dry.  ?Neurological:  ?   Mental Status: He is alert.  ?   Comments: Alert. Clear speech. No facial droop. CNIII-XII grossly intact. Bilateral upper and lower extremities' sensation grossly intact. 5/5 symmetric strength with grip strength and with plantar and dorsi flexion bilaterally . Normal finger to nose bilaterally. Ambulatory.  ?  ?Psychiatric:     ?   Mood and Affect: Mood normal.     ?   Behavior: Behavior normal.  ? ? ?ED Results / Procedures / Treatments   ?Labs ?(all labs ordered are listed, but only abnormal results are displayed) ?Labs Reviewed - No data to display ? ?EKG ?None ? ?Radiology ?No results found. ? ?Procedures ?Procedures  ? ? ?Medications Ordered in ED ?Medications - No data to display ? ?ED Course/ Medical Decision Making/ A&P ?  ?                        ?Medical Decision  Making ? ?Patient presents to the ED to be re checked out. No complaints currently  ?Nontoxic, vitals without significant abnormality.  ? ?Additional history obtained:  ?Chart & nursing note reviewed.  ?Reviewed most recent admission- details above.   ?Reviewed blood work from earlier this evening:  ?CBC: unremarkable ?CMP: mild hyperglycemia w/o acidosis/anion gap elevation.  ? ?Patient currently resting comfortably with no complaints, had blood work within the past 4 hours which was overall reassuring. He has no focal neuro deficits. He is tolerating PO in the ED. He overall seems appropriate for discharge with outpatient neurology and psychiatry follow up. I discussed results, treatment plan, need for follow-up, and return precautions with the patient. Provided opportunity for questions, patient confirmed understanding and is in agreement with plan.  ? ?Findings and plan of care discussed with supervising physician Dr. Ashok Cordia who is in agreement.  ? ? ?  Portions of this note were generated with [employer]. Dictation errors may occur despite best attempts at proofreading. ? ?Final Clinical Impression(s) / ED Diagnoses ?Final diagnoses:  ?History of seizure  ? ? ?Rx / DC Orders ?ED Discharge Orders   ? ? None  ? ?  ? ? ?  ?Amaryllis Dyke, PA-C ?01/02/22 2328 ? ?  ?Lajean Saver, MD ?01/03/22 1249 ? ?

## 2022-01-03 ENCOUNTER — Telehealth: Payer: Self-pay

## 2022-01-03 NOTE — ED Notes (Signed)
DC instructions reviewed with pt by Alberto-RN.   ?

## 2022-01-03 NOTE — Telephone Encounter (Signed)
Transition Care Management Unsuccessful Follow-up Telephone Call ? ?Date of discharge and from where:  01/02/2022, [name], left AMA and then returned to the ED twice after that.  ? ?Attempts:  1st Attempt ? ?Reason for unsuccessful TCM follow-up call:  Left voice message on # [phone]. ?Call back requested.   ? ?Need to discuss scheduling a hospital follow up appointment with PCP.  ? ? ? ?

## 2022-01-04 ENCOUNTER — Emergency Department (HOSPITAL_COMMUNITY): Payer: Medicaid Other

## 2022-01-04 ENCOUNTER — Inpatient Hospital Stay (HOSPITAL_COMMUNITY)
Admission: EM | Admit: 2022-01-04 | Discharge: 2022-01-11 | DRG: 637 | Disposition: A | Discharge status: Home / Self Care | Payer: Medicaid Other | Attending: Internal Medicine | Admitting: Internal Medicine

## 2022-01-04 ENCOUNTER — Other Ambulatory Visit: Payer: Self-pay

## 2022-01-04 ENCOUNTER — Encounter (HOSPITAL_COMMUNITY): Payer: Self-pay

## 2022-01-04 ENCOUNTER — Telehealth: Payer: Self-pay

## 2022-01-04 DIAGNOSIS — E10649 Type 1 diabetes mellitus with hypoglycemia without coma: Secondary | ICD-10-CM | POA: Diagnosis present

## 2022-01-04 DIAGNOSIS — E1042 Type 1 diabetes mellitus with diabetic polyneuropathy: Secondary | ICD-10-CM | POA: Diagnosis present

## 2022-01-04 DIAGNOSIS — S065XAA Traumatic subdural hemorrhage with loss of consciousness status unknown, initial encounter: Secondary | ICD-10-CM | POA: Diagnosis present

## 2022-01-04 DIAGNOSIS — E038 Other specified hypothyroidism: Secondary | ICD-10-CM | POA: Diagnosis not present

## 2022-01-04 DIAGNOSIS — F1210 Cannabis abuse, uncomplicated: Secondary | ICD-10-CM | POA: Diagnosis present

## 2022-01-04 DIAGNOSIS — Z794 Long term (current) use of insulin: Secondary | ICD-10-CM

## 2022-01-04 DIAGNOSIS — Z880 Allergy status to penicillin: Secondary | ICD-10-CM

## 2022-01-04 DIAGNOSIS — Z833 Family history of diabetes mellitus: Secondary | ICD-10-CM

## 2022-01-04 DIAGNOSIS — G919 Hydrocephalus, unspecified: Secondary | ICD-10-CM

## 2022-01-04 DIAGNOSIS — G40909 Epilepsy, unspecified, not intractable, without status epilepticus: Secondary | ICD-10-CM | POA: Diagnosis present

## 2022-01-04 DIAGNOSIS — I951 Orthostatic hypotension: Secondary | ICD-10-CM | POA: Diagnosis present

## 2022-01-04 DIAGNOSIS — Z91018 Allergy to other foods: Secondary | ICD-10-CM | POA: Diagnosis not present

## 2022-01-04 DIAGNOSIS — G9389 Other specified disorders of brain: Secondary | ICD-10-CM | POA: Diagnosis present

## 2022-01-04 DIAGNOSIS — F1910 Other psychoactive substance abuse, uncomplicated: Secondary | ICD-10-CM | POA: Diagnosis present

## 2022-01-04 DIAGNOSIS — U071 COVID-19: Secondary | ICD-10-CM | POA: Diagnosis not present

## 2022-01-04 DIAGNOSIS — R531 Weakness: Secondary | ICD-10-CM

## 2022-01-04 DIAGNOSIS — F419 Anxiety disorder, unspecified: Secondary | ICD-10-CM | POA: Diagnosis present

## 2022-01-04 DIAGNOSIS — Z79899 Other long term (current) drug therapy: Secondary | ICD-10-CM

## 2022-01-04 DIAGNOSIS — F17200 Nicotine dependence, unspecified, uncomplicated: Secondary | ICD-10-CM | POA: Diagnosis present

## 2022-01-04 DIAGNOSIS — Z881 Allergy status to other antibiotic agents status: Secondary | ICD-10-CM | POA: Diagnosis not present

## 2022-01-04 DIAGNOSIS — R402252 Coma scale, best verbal response, oriented, at arrival to emergency department: Secondary | ICD-10-CM | POA: Diagnosis present

## 2022-01-04 DIAGNOSIS — E1010 Type 1 diabetes mellitus with ketoacidosis without coma: Secondary | ICD-10-CM | POA: Diagnosis present

## 2022-01-04 DIAGNOSIS — F3189 Other bipolar disorder: Secondary | ICD-10-CM

## 2022-01-04 DIAGNOSIS — S066XAA Traumatic subarachnoid hemorrhage with loss of consciousness status unknown, initial encounter: Secondary | ICD-10-CM | POA: Diagnosis present

## 2022-01-04 DIAGNOSIS — I619 Nontraumatic intracerebral hemorrhage, unspecified: Secondary | ICD-10-CM | POA: Diagnosis present

## 2022-01-04 DIAGNOSIS — R569 Unspecified convulsions: Secondary | ICD-10-CM

## 2022-01-04 DIAGNOSIS — Z91199 Patient's noncompliance with other medical treatment and regimen due to unspecified reason: Secondary | ICD-10-CM

## 2022-01-04 DIAGNOSIS — Z5900 Homelessness unspecified: Secondary | ICD-10-CM

## 2022-01-04 DIAGNOSIS — R739 Hyperglycemia, unspecified: Secondary | ICD-10-CM

## 2022-01-04 DIAGNOSIS — Z882 Allergy status to sulfonamides status: Secondary | ICD-10-CM

## 2022-01-04 DIAGNOSIS — Z8616 Personal history of COVID-19: Secondary | ICD-10-CM | POA: Diagnosis not present

## 2022-01-04 DIAGNOSIS — F3160 Bipolar disorder, current episode mixed, unspecified: Secondary | ICD-10-CM

## 2022-01-04 DIAGNOSIS — R402362 Coma scale, best motor response, obeys commands, at arrival to emergency department: Secondary | ICD-10-CM | POA: Diagnosis present

## 2022-01-04 DIAGNOSIS — S06360A Traumatic hemorrhage of cerebrum, unspecified, without loss of consciousness, initial encounter: Secondary | ICD-10-CM | POA: Diagnosis not present

## 2022-01-04 DIAGNOSIS — R402142 Coma scale, eyes open, spontaneous, at arrival to emergency department: Secondary | ICD-10-CM | POA: Diagnosis present

## 2022-01-04 DIAGNOSIS — G9341 Metabolic encephalopathy: Secondary | ICD-10-CM | POA: Diagnosis present

## 2022-01-04 DIAGNOSIS — F319 Bipolar disorder, unspecified: Secondary | ICD-10-CM | POA: Diagnosis present

## 2022-01-04 DIAGNOSIS — F17210 Nicotine dependence, cigarettes, uncomplicated: Secondary | ICD-10-CM | POA: Diagnosis present

## 2022-01-04 DIAGNOSIS — Z79890 Hormone replacement therapy (postmenopausal): Secondary | ICD-10-CM | POA: Diagnosis not present

## 2022-01-04 DIAGNOSIS — I609 Nontraumatic subarachnoid hemorrhage, unspecified: Secondary | ICD-10-CM

## 2022-01-04 DIAGNOSIS — M899 Disorder of bone, unspecified: Secondary | ICD-10-CM | POA: Diagnosis present

## 2022-01-04 DIAGNOSIS — I6203 Nontraumatic chronic subdural hemorrhage: Secondary | ICD-10-CM

## 2022-01-04 DIAGNOSIS — E1110 Type 2 diabetes mellitus with ketoacidosis without coma: Secondary | ICD-10-CM | POA: Diagnosis present

## 2022-01-04 DIAGNOSIS — E039 Hypothyroidism, unspecified: Secondary | ICD-10-CM | POA: Diagnosis present

## 2022-01-04 DIAGNOSIS — R296 Repeated falls: Secondary | ICD-10-CM | POA: Diagnosis present

## 2022-01-04 DIAGNOSIS — Z9114 Patient's other noncompliance with medication regimen: Secondary | ICD-10-CM

## 2022-01-04 LAB — CBG MONITORING, ED
Glucose-Capillary: >600 mg/dL (ref 70–99)
Glucose-Capillary: >600 mg/dL (ref 70–99)
Glucose-Capillary: >600 mg/dL (ref 70–99)
Glucose-Capillary: 515 mg/dL (ref 70–99)
Glucose-Capillary: >600 mg/dL (ref 70–99)
Glucose-Capillary: 421 mg/dL — ABNORMAL HIGH (ref 70–99)
Glucose-Capillary: 462 mg/dL — ABNORMAL HIGH (ref 70–99)
Glucose-Capillary: 375 mg/dL — ABNORMAL HIGH (ref 70–99)
Glucose-Capillary: 291 mg/dL — ABNORMAL HIGH (ref 70–99)
Glucose-Capillary: 239 mg/dL — ABNORMAL HIGH (ref 70–99)

## 2022-01-04 LAB — RAPID URINE DRUG SCREEN, HOSP PERFORMED
Amphetamines: NOT DETECTED
Barbiturates: NOT DETECTED
Benzodiazepines: NOT DETECTED
Cocaine: NOT DETECTED
Opiates: POSITIVE — AB
Tetrahydrocannabinol: POSITIVE — AB

## 2022-01-04 LAB — URINALYSIS, ROUTINE W REFLEX MICROSCOPIC
Bacteria, UA: NONE SEEN
Bilirubin Urine: NEGATIVE
Glucose, UA: >=500 mg/dL — AB
Ketones, ur: 80 mg/dL — AB
Leukocytes,Ua: NEGATIVE
Nitrite: NEGATIVE
Protein, ur: NEGATIVE mg/dL
Specific Gravity, Urine: 1.021 (ref 1.005–1.030)
pH: 5 (ref 5.0–8.0)

## 2022-01-04 LAB — COMPREHENSIVE METABOLIC PANEL
ALT: 28 U/L (ref 0–44)
AST: 36 U/L (ref 15–41)
Albumin: 4.6 g/dL (ref 3.5–5.0)
Alkaline Phosphatase: 83 U/L (ref 38–126)
Anion gap: 30 — ABNORMAL HIGH (ref 5–15)
BUN: 32 mg/dL — ABNORMAL HIGH (ref 6–20)
CO2: 11 mmol/L — ABNORMAL LOW (ref 22–32)
Calcium: 9.7 mg/dL (ref 8.9–10.3)
Chloride: 91 mmol/L — ABNORMAL LOW (ref 98–111)
Creatinine, Ser: 1.89 mg/dL — ABNORMAL HIGH (ref 0.61–1.24)
GFR, Estimated: 46 mL/min — ABNORMAL LOW (ref >60)
Glucose, Bld: 919 mg/dL (ref 70–99)
Potassium: 5.6 mmol/L — ABNORMAL HIGH (ref 3.5–5.1)
Sodium: 132 mmol/L — ABNORMAL LOW (ref 135–145)
Total Bilirubin: 1.7 mg/dL — ABNORMAL HIGH (ref 0.3–1.2)
Total Protein: 7.7 g/dL (ref 6.5–8.1)

## 2022-01-04 LAB — TSH: TSH: 6.936 u[IU]/mL — ABNORMAL HIGH (ref 0.350–4.500)

## 2022-01-04 LAB — CBC WITH DIFFERENTIAL/PLATELET
Abs Immature Granulocytes: 0.06 10*3/uL (ref 0.00–0.07)
Basophils Absolute: 0.1 10*3/uL (ref 0.0–0.1)
Basophils Relative: 0 %
Eosinophils Absolute: 0 10*3/uL (ref 0.0–0.5)
Eosinophils Relative: 0 %
HCT: 41.3 % (ref 39.0–52.0)
Hemoglobin: 13.2 g/dL (ref 13.0–17.0)
Immature Granulocytes: 0 %
Lymphocytes Relative: 6 %
Lymphs Abs: 0.9 10*3/uL (ref 0.7–4.0)
MCH: 31.7 pg (ref 26.0–34.0)
MCHC: 32 g/dL (ref 30.0–36.0)
MCV: 99.3 fL (ref 80.0–100.0)
Monocytes Absolute: 0.6 10*3/uL (ref 0.1–1.0)
Monocytes Relative: 4 %
Neutro Abs: 14.3 10*3/uL — ABNORMAL HIGH (ref 1.7–7.7)
Neutrophils Relative %: 90 %
Platelets: 327 10*3/uL (ref 150–400)
RBC: 4.16 MIL/uL — ABNORMAL LOW (ref 4.22–5.81)
RDW: 12.2 % (ref 11.5–15.5)
WBC: 15.9 10*3/uL — ABNORMAL HIGH (ref 4.0–10.5)
nRBC: 0 % (ref 0.0–0.2)

## 2022-01-04 LAB — BASIC METABOLIC PANEL
Anion gap: 21 — ABNORMAL HIGH (ref 5–15)
Anion gap: 11 (ref 5–15)
BUN: 31 mg/dL — ABNORMAL HIGH (ref 6–20)
BUN: 24 mg/dL — ABNORMAL HIGH (ref 6–20)
CO2: 11 mmol/L — ABNORMAL LOW (ref 22–32)
CO2: 23 mmol/L (ref 22–32)
Calcium: 8.7 mg/dL — ABNORMAL LOW (ref 8.9–10.3)
Calcium: 9.2 mg/dL (ref 8.9–10.3)
Chloride: 104 mmol/L (ref 98–111)
Chloride: 106 mmol/L (ref 98–111)
Creatinine, Ser: 1.83 mg/dL — ABNORMAL HIGH (ref 0.61–1.24)
Creatinine, Ser: 1.2 mg/dL (ref 0.61–1.24)
GFR, Estimated: 48 mL/min — ABNORMAL LOW (ref >60)
GFR, Estimated: >60 mL/min (ref >60)
Glucose, Bld: 645 mg/dL (ref 70–99)
Glucose, Bld: 199 mg/dL — ABNORMAL HIGH (ref 70–99)
Potassium: 4.1 mmol/L (ref 3.5–5.1)
Potassium: 4.1 mmol/L (ref 3.5–5.1)
Sodium: 136 mmol/L (ref 135–145)
Sodium: 140 mmol/L (ref 135–145)

## 2022-01-04 LAB — VALPROIC ACID LEVEL: Valproic Acid Lvl: <10 ug/mL — ABNORMAL LOW (ref 50.0–100.0)

## 2022-01-04 LAB — I-STAT VENOUS BLOOD GAS, ED
Acid-base deficit: 11 mmol/L — ABNORMAL HIGH (ref 0.0–2.0)
Bicarbonate: 15.6 mmol/L — ABNORMAL LOW (ref 20.0–28.0)
Calcium, Ion: 1.12 mmol/L — ABNORMAL LOW (ref 1.15–1.40)
HCT: 41 % (ref 39.0–52.0)
Hemoglobin: 13.9 g/dL (ref 13.0–17.0)
O2 Saturation: 98 %
Potassium: 5.6 mmol/L — ABNORMAL HIGH (ref 3.5–5.1)
Sodium: 129 mmol/L — ABNORMAL LOW (ref 135–145)
TCO2: 17 mmol/L — ABNORMAL LOW (ref 22–32)
pCO2, Ven: 36.1 mmHg — ABNORMAL LOW (ref 44–60)
pH, Ven: 7.245 — ABNORMAL LOW (ref 7.25–7.43)
pO2, Ven: 120 mmHg — ABNORMAL HIGH (ref 32–45)

## 2022-01-04 LAB — BETA-HYDROXYBUTYRIC ACID
Beta-Hydroxybutyric Acid: >8 mmol/L — ABNORMAL HIGH (ref 0.05–0.27)
Beta-Hydroxybutyric Acid: 1.94 mmol/L — ABNORMAL HIGH (ref 0.05–0.27)

## 2022-01-04 LAB — GLUCOSE, CAPILLARY
Glucose-Capillary: 258 mg/dL — ABNORMAL HIGH (ref 70–99)
Glucose-Capillary: 198 mg/dL — ABNORMAL HIGH (ref 70–99)
Glucose-Capillary: 262 mg/dL — ABNORMAL HIGH (ref 70–99)

## 2022-01-04 MED ORDER — INSULIN REGULAR(HUMAN) IN NACL 100-0.9 UT/100ML-% IV SOLN
INTRAVENOUS | Status: DC
  Administered 2022-01-04: 3.6 [IU]/h via INTRAVENOUS
  Administered 2022-01-04: 13 [IU]/h via INTRAVENOUS
  Filled 2022-01-04 (×2): qty 100

## 2022-01-04 MED ORDER — INSULIN ASPART 100 UNIT/ML IJ SOLN
0.0000 [IU] | Freq: Three times a day (TID) | INTRAMUSCULAR | Status: DC
  Administered 2022-01-05: 4 [IU] via SUBCUTANEOUS
  Administered 2022-01-05: 8 [IU] via SUBCUTANEOUS
  Administered 2022-01-06: 3 [IU] via SUBCUTANEOUS
  Administered 2022-01-07: 8 [IU] via SUBCUTANEOUS
  Administered 2022-01-07: 5 [IU] via SUBCUTANEOUS
  Administered 2022-01-08: 3 [IU] via SUBCUTANEOUS
  Administered 2022-01-08: 8 [IU] via SUBCUTANEOUS
  Administered 2022-01-09: 5 [IU] via SUBCUTANEOUS
  Administered 2022-01-09 (×2): 8 [IU] via SUBCUTANEOUS
  Administered 2022-01-10 – 2022-01-11 (×4): 3 [IU] via SUBCUTANEOUS
  Administered 2022-01-11: 2 [IU] via SUBCUTANEOUS

## 2022-01-04 MED ORDER — THIAMINE HCL 100 MG/ML IJ SOLN: 100.0000 mg | Freq: Every day | INTRAMUSCULAR | Status: DC

## 2022-01-04 MED ORDER — FOLIC ACID 1 MG PO TABS
1.0000 mg | ORAL_TABLET | Freq: Every day | ORAL | Status: DC
  Administered 2022-01-04 – 2022-01-11 (×8): 1 mg via ORAL
  Filled 2022-01-04 (×8): qty 1

## 2022-01-04 MED ORDER — DIPHENHYDRAMINE HCL 50 MG/ML IJ SOLN
25.0000 mg | Freq: Once | INTRAMUSCULAR | Status: AC
  Administered 2022-01-04: 25 mg via INTRAVENOUS
  Filled 2022-01-04: qty 1

## 2022-01-04 MED ORDER — DIVALPROEX SODIUM 250 MG PO DR TAB
500.0000 mg | DELAYED_RELEASE_TABLET | Freq: Two times a day (BID) | ORAL | Status: DC
  Administered 2022-01-04 – 2022-01-08 (×8): 500 mg via ORAL
  Filled 2022-01-04 (×8): qty 2

## 2022-01-04 MED ORDER — ESCITALOPRAM OXALATE 20 MG PO TABS
20.0000 mg | ORAL_TABLET | Freq: Every day | ORAL | Status: DC
  Administered 2022-01-05 – 2022-01-08 (×4): 20 mg via ORAL
  Filled 2022-01-04 (×4): qty 1

## 2022-01-04 MED ORDER — HALOPERIDOL LACTATE 5 MG/ML IJ SOLN: 5.0000 mg | Freq: Four times a day (QID) | INTRAMUSCULAR | Status: DC | PRN

## 2022-01-04 MED ORDER — THIAMINE HCL 100 MG PO TABS
100.0000 mg | ORAL_TABLET | Freq: Every day | ORAL | Status: DC
  Administered 2022-01-04 – 2022-01-11 (×8): 100 mg via ORAL
  Filled 2022-01-04 (×8): qty 1

## 2022-01-04 MED ORDER — LEVETIRACETAM 500 MG PO TABS
500.0000 mg | ORAL_TABLET | Freq: Two times a day (BID) | ORAL | Status: DC
Start: 2022-01-04 — End: 2022-01-11
  Administered 2022-01-04 – 2022-01-10 (×14): 500 mg via ORAL
  Filled 2022-01-04 (×15): qty 1

## 2022-01-04 MED ORDER — INSULIN ASPART 100 UNIT/ML IJ SOLN
4.0000 [IU] | Freq: Three times a day (TID) | INTRAMUSCULAR | Status: DC
  Administered 2022-01-05 – 2022-01-08 (×5): 4 [IU] via SUBCUTANEOUS

## 2022-01-04 MED ORDER — SODIUM CHLORIDE 0.9 % IV BOLUS
1000.0000 mL | Freq: Once | INTRAVENOUS | Status: AC
  Administered 2022-01-04: 1000 mL via INTRAVENOUS

## 2022-01-04 MED ORDER — DEXTROSE IN LACTATED RINGERS 5 % IV SOLN: INTRAVENOUS | Status: DC

## 2022-01-04 MED ORDER — MORPHINE SULFATE (PF) 4 MG/ML IV SOLN
4.0000 mg | Freq: Once | INTRAVENOUS | Status: AC
  Administered 2022-01-04: 4 mg via INTRAVENOUS
  Filled 2022-01-04: qty 1

## 2022-01-04 MED ORDER — QUETIAPINE FUMARATE 100 MG PO TABS
400.0000 mg | ORAL_TABLET | Freq: Every day | ORAL | Status: DC
  Administered 2022-01-04 – 2022-01-10 (×7): 400 mg via ORAL
  Filled 2022-01-04 (×7): qty 4

## 2022-01-04 MED ORDER — DEXTROSE 50 % IV SOLN: 0.0000 mL | INTRAVENOUS | Status: DC | PRN

## 2022-01-04 MED ORDER — LACTATED RINGERS IV SOLN: INTRAVENOUS | Status: DC

## 2022-01-04 MED ORDER — KETOROLAC TROMETHAMINE 30 MG/ML IJ SOLN
30.0000 mg | Freq: Once | INTRAMUSCULAR | Status: AC
  Administered 2022-01-04: 30 mg via INTRAVENOUS
  Filled 2022-01-04: qty 1

## 2022-01-04 MED ORDER — LORAZEPAM 2 MG/ML IJ SOLN: 1.0000 mg | INTRAMUSCULAR | Status: AC | PRN

## 2022-01-04 MED ORDER — NICOTINE 14 MG/24HR TD PT24
14.0000 mg | MEDICATED_PATCH | Freq: Every day | TRANSDERMAL | Status: DC
  Administered 2022-01-04 – 2022-01-11 (×8): 14 mg via TRANSDERMAL
  Filled 2022-01-04 (×8): qty 1

## 2022-01-04 MED ORDER — INSULIN ASPART 100 UNIT/ML IJ SOLN
0.0000 [IU] | Freq: Every day | INTRAMUSCULAR | Status: DC
  Administered 2022-01-04 – 2022-01-05 (×2): 3 [IU] via SUBCUTANEOUS
  Administered 2022-01-06: 5 [IU] via SUBCUTANEOUS
  Administered 2022-01-07 – 2022-01-08 (×2): 4 [IU] via SUBCUTANEOUS
  Administered 2022-01-10: 3 [IU] via SUBCUTANEOUS

## 2022-01-04 MED ORDER — ONDANSETRON HCL 4 MG/2ML IJ SOLN: 4.0000 mg | Freq: Four times a day (QID) | INTRAMUSCULAR | Status: DC | PRN

## 2022-01-04 MED ORDER — LEVOTHYROXINE SODIUM 50 MCG PO TABS
50.0000 ug | ORAL_TABLET | Freq: Every day | ORAL | Status: DC
  Administered 2022-01-04 – 2022-01-11 (×6): 50 ug via ORAL
  Filled 2022-01-04 (×3): qty 1
  Filled 2022-01-04: qty 2
  Filled 2022-01-04 (×3): qty 1

## 2022-01-04 MED ORDER — LACTATED RINGERS IV BOLUS
1000.0000 mL | INTRAVENOUS | Status: AC
  Administered 2022-01-04 (×2): 1000 mL via INTRAVENOUS

## 2022-01-04 MED ORDER — METOCLOPRAMIDE HCL 5 MG/ML IJ SOLN
10.0000 mg | Freq: Once | INTRAMUSCULAR | Status: AC
  Administered 2022-01-04: 10 mg via INTRAVENOUS
  Filled 2022-01-04: qty 2

## 2022-01-04 MED ORDER — ADULT MULTIVITAMIN W/MINERALS CH
1.0000 | ORAL_TABLET | Freq: Every day | ORAL | Status: DC
  Administered 2022-01-04 – 2022-01-11 (×8): 1 via ORAL
  Filled 2022-01-04 (×8): qty 1

## 2022-01-04 MED ORDER — ACETAMINOPHEN 325 MG PO TABS
650.0000 mg | ORAL_TABLET | Freq: Four times a day (QID) | ORAL | Status: DC | PRN
  Administered 2022-01-07 – 2022-01-11 (×10): 650 mg via ORAL
  Filled 2022-01-04 (×10): qty 2

## 2022-01-04 MED ORDER — INSULIN GLARGINE-YFGN 100 UNIT/ML ~~LOC~~ SOLN
30.0000 [IU] | SUBCUTANEOUS | Status: DC
  Administered 2022-01-04 – 2022-01-07 (×4): 30 [IU] via SUBCUTANEOUS
  Filled 2022-01-04 (×5): qty 0.3

## 2022-01-04 MED ORDER — LORAZEPAM 1 MG PO TABS
1.0000 mg | ORAL_TABLET | ORAL | Status: AC | PRN
  Administered 2022-01-05: 1 mg via ORAL
  Administered 2022-01-05: 2 mg via ORAL
  Filled 2022-01-04: qty 2
  Filled 2022-01-04: qty 1
  Filled 2022-01-04: qty 2

## 2022-01-04 NOTE — Assessment & Plan Note (Signed)
-  Patient with reasonable baseline control (recent A1c 7.0) ?-However, he did not apparently take insulin since hospital d/c on 3/14 (he left AMA) ?-No indication of illness as source ?-Moderate DKA on admission based on pH 7.245, HCO3 11, anion gap 30, patient alert ?-Will admit to SDU with DKA protocol ?-Would recommend continuing insulin drip at least until morning regardless of rapidity of closure of gap and normalization of labs ?-K+ slightly increased at time of presentation ?-IVF at 150 cc/hr, LR until glucose <250 and then decrease rate to 125 and change to D5LR ?-His primary issues appear to be need for compliance - diabetes coordinator and dietician consulted; [facility] consult requested ?

## 2022-01-04 NOTE — Assessment & Plan Note (Signed)
-  Continue Synthroid (normal TSH a week ago, now high -?compliance) ?

## 2022-01-04 NOTE — Assessment & Plan Note (Signed)
-  h/o seizures ?-recent negative EEG ?-Currently without concern for seizures ?-He is supposed to taper off Keppra and be on Depakote 500 mg BID but there is no evidence of him taking Depakote at this time ?-He was also supposed to stop Wellbutrin, as this medication can lower the seizure threshold; it was listed as active on his Med Rec with last dose yesterday but will discontinue at this time ?

## 2022-01-04 NOTE — Assessment & Plan Note (Signed)
-  Found incidentally at T7 during last hospitalization ?-He was planned for MRI but was uncooperative and so MRI was deferred to PCP ?-Could consider further evaluation during this hospitalization if patient is cooperative enough (so far this appears to be unlikely) ?

## 2022-01-04 NOTE — Assessment & Plan Note (Signed)
-  Recurrent falls ?-Small SAH and minimal SDH noted on CT ?-EDP discussed with neurosurgery who recommends repeat CT in AM; he does not need surgical intervention at this time ?

## 2022-01-04 NOTE — Assessment & Plan Note (Addendum)
-  Psychiatry was consulted during last hospitalization ?-He has intermittent angry outbursts at baseline, and just had one upstairs (pulled out 2 IVs, went to the sink and drank water out of the urinal) ?-He was supposed to be started on Depakote with plan for tapering off Keppra once therapeutic, but he does not appear to have started this medication ?-As per plan last hospitalization, will start Depakote 500 mg PO BID with level checked in the AM (was negative today) ?-Haldol has been added for agitation (but he refused this) ?-Continue Lexapro and Seroquel ?

## 2022-01-04 NOTE — ED Notes (Signed)
Patient transported to X-ray 

## 2022-01-04 NOTE — Assessment & Plan Note (Signed)
-  Encourage cessation.   -Patch ordered  

## 2022-01-04 NOTE — Assessment & Plan Note (Addendum)
-  Acknowledges only routine THC use and periodic (daily?) ETOH use for now ?-Cessation should be encouraged on an ongoing basis ?-Will order CIWA and prn Ativan ?-UDS ordered ?

## 2022-01-04 NOTE — H&P (Signed)
?History and Physical  ? ? ?Patient: Mark [name] HYW:[id number] DOB: [birth year] ?DOA: 01/04/2022 ?DOS: the patient was seen and examined on 01/04/2022 ?PCP: Camillia Herter, NP  ?Patient coming from: Home - lives with roommate NOK: Tilford Pillar, [phone] ? ? ?Chief Complaint: Fall ? ?HPI: Mark [name] is a 36 y.o. male with medical history significant of seizures; hydrocephalus; and DM presenting with a fall.  He was last admitted fro 3/8-14 with AMS and seizures and left AMA.  He returned later that day to get checked out again; he was discharged with plan for outpatient neurology and psychiatry f/u.  He was pleasant at the time of my admission but looks significantly older than when I last admitted him.  He was calm, having recently received parenteral opiates.  He did not take his insulin after last d/c.  He returned today with frequent falls, generalized weakness, and pain.  He has a h/o erratic and aggressive behavior. ? ? ? ?ER Course:  Here with DKA, not taking insulin since he went home.  Also with SDH/SAH from frequent falls.  Neurosurgery consulted, unlikely to intervene. ? ? ? ? ?Review of Systems: As mentioned in the history of present illness. All other systems reviewed and are negative. ?Past Medical History:  ?Diagnosis Date  ? Anxiety   ? Bipolar disorder ([place])   ? Depression   ? Diabetes mellitus without complication ([place])   ? Diabetic polyneuropathy associated with type 1 diabetes mellitus ([place])   ? Hydrocephalus ([place])   ? Kidney stones   ? Seizures ([place])   ? Truncal ataxia   ? ?Past Surgical History:  ?Procedure Laterality Date  ? BRAIN SURGERY    ? hydrocephalus  ? KIDNEY STONE SURGERY    ? ?Social History:  reports that he has been smoking cigarettes. He has a 20.00 pack-year smoking history. He has never used smokeless tobacco. He reports current alcohol use. He reports current drug use. Frequency: 7.00 times per week. Drug: Marijuana. ? ?Allergies  ?Allergen Reactions  ?  Mushroom Extract Complex Hives, Itching and Nausea And Vomiting  ? Penicillins Anaphylaxis, Hives and Swelling  ?  Has patient had a PCN reaction causing immediate rash, facial/tongue/throat swelling, SOB or lightheadedness with hypotension: Yes ?Has patient had a PCN reaction causing severe rash involving mucus membranes or skin necrosis: No ?Has patient had a PCN reaction that required hospitalization: Yes ?Has patient had a PCN reaction occurring within the last 10 years: Yes ?If all of the above answers are "NO", then may proceed with Cephalosporin use. ?Tolerated ceftriaxone 03/24/20  ? Sulfa Antibiotics Anaphylaxis and Hives  ? Clindamycin/Lincomycin Hives  ? ? ?Family History  ?Problem Relation Age of Onset  ? Breast cancer Mother   ? Other Father   ?     unsure of medical history  ? Diabetes Paternal Aunt   ? ? ?Prior to Admission medications   ?Medication Sig Start Date End Date Taking? Authorizing Provider  ?Blood Glucose Monitoring Suppl (ACCU-CHEK GUIDE) w/Device KIT 1 Device by Does not apply route daily. 12/13/21   Madalyn Rob, MD  ?buPROPion (WELLBUTRIN XL) 150 MG 24 hr tablet Take 1 tablet (150 mg total) by mouth every morning. Patient to take 1 tablet (150 mg total) for 6 days then 2 tablets (300 mg total)  daily ?Patient not taking: Reported on 12/29/2021 12/13/21 12/13/22  Madalyn Rob, MD  ?escitalopram (LEXAPRO) 20 MG tablet Take 1 tablet (20 mg total) by mouth daily. ?Patient  not taking: Reported on 12/29/2021 12/13/21 01/12/22  Madalyn Rob, MD  ?glucose blood (ACCU-CHEK GUIDE) test strip Check blood glucose 4 times daily.Before breakfast,lunch and dinner. Also before bed time. 12/13/21   Madalyn Rob, MD  ?insulin isophane & regular human KwikPen (NOVOLIN 70/30 KWIKPEN) (70-30) 100 UNIT/ML KwikPen Inject 45 Units into the skin daily before breakfast AND 35 Units daily before lunch AND 25 Units daily before supper. 12/13/21   Madalyn Rob, MD  ?Accu-Chek Softclix Lancets lancets Check blood glucose 3  times a day before meals and before bedtime. 12/13/21   Madalyn Rob, MD  ?levETIRAcetam (KEPPRA) 500 MG tablet Take 1 tablet (500 mg total) by mouth 2 (two) times daily. ?Patient not taking: Reported on 12/29/2021 12/13/21   Madalyn Rob, MD  ?levothyroxine (SYNTHROID) 50 MCG tablet Take 1 tablet (50 mcg total) by mouth daily before breakfast. ?Patient not taking: Reported on 12/29/2021 12/14/21   Madalyn Rob, MD  ?QUEtiapine (SEROQUEL) 200 MG tablet Take 2 tablets (400 mg total) by mouth at bedtime. ?Patient not taking: Reported on 12/29/2021 12/13/21 01/12/22  Madalyn Rob, MD  ? ? ?Physical Exam: ?Vitals:  ? 01/04/22 1120 01/04/22 1300 01/04/22 1445 01/04/22 1500  ?BP: 121/63 (!) 104/59 104/60 106/64  ?Pulse: (!) 108 90 90 84  ?Resp:  _0 ?Temp:      ?TempSrc:      ?SpO2:  100% 99% 99%  ? ?General:  Appears calm and comfortable and is in NAD; appears much older than stated age ?Eyes:  PERRL, EOMI, normal lids, iris ?ENT:  grossly normal hearing, lips & tongue, mildly dry mm; poor/mostly absent dentition ?Neck:  no LAD, masses or thyromegaly ?Cardiovascular:  RR with tachycardia, no m/r/g. No LE edema.  ?Respiratory:   CTA bilaterally with no wheezes/rales/rhonchi.  Normal respiratory effort. ?Abdomen:  soft, NT, ND ?Skin:  no rash or induration seen on limited exam ?Musculoskeletal:  grossly normal tone BUE/BLE, good ROM, no bony abnormality ?Psychiatric:  flat mood and affect, speech fluent and appropriate, AOx3, not angry/aggressive at the time of my evaluation but he did have recurrent questions about drinking ?Neurologic:  CN 2-12 grossly intact, moves all extremities in coordinated fashion ? ? ?Radiological Exams on Admission: ?Independently reviewed - see discussion in A/P where applicable ? ?DG Chest 1 View ? ?Result Date: 01/04/2022 ?CLINICAL DATA:  36 year old male with recent falls. Low back pain and weakness. EXAM: CHEST  1 VIEW COMPARISON:  Portable chest 12/28/2021 and earlier. FINDINGS: Portable AP  supine view at 0903 hours. Improved, normal lung volumes. Normal cardiac size and mediastinal contours. Visualized tracheal air column is within normal limits. Allowing for portable technique the lungs are clear. No pneumothorax. No osseous abnormality identified. IMPRESSION: Negative portable chest. Electronically Signed   By: Genevie Ann M.D.   On: 01/04/2022 09:31  ? ?DG Lumbar Spine Complete ? ?Result Date: 01/04/2022 ?CLINICAL DATA:  36 year old male with recent falls. Low back pain and weakness. EXAM: LUMBAR SPINE - COMPLETE 4+ VIEW COMPARISON:  Lumbar radiographs 11/26/2010. FINDINGS: Normal lumbar segmentation. Bone mineralization is within normal limits. Mild dextroconvex lumbar scoliosis since 2012. But stable lumbar lordosis. No pars fracture. No acute osseous abnormality identified. Visible sacrum and SI joints appear negative. Relatively preserved disc spaces, not significantly changed since 2012. Negative visible abdomen and pelvis soft tissue contours. IMPRESSION: 1. No acute osseous abnormality identified in the lumbar spine. 2. Mild dextroconvex lumbar scoliosis has developed since 2012. Electronically Signed   By: Genevie Ann  M.D.   On: 01/04/2022 09:30  ? ?CT Head Wo Contrast ? ?Result Date: 01/04/2022 ?CLINICAL DATA:  Headache, sudden, severe EXAM: CT HEAD WITHOUT CONTRAST TECHNIQUE: Contiguous axial images were obtained from the base of the skull through the vertex without intravenous contrast. RADIATION DOSE REDUCTION: This exam was performed according to the departmental dose-optimization program which includes automated exposure control, adjustment of the mA and/or kV according to patient size and/or use of iterative reconstruction technique. COMPARISON:  12/27/2021 FINDINGS: Brain: Small volume sulcal subarachnoid hemorrhage primarily in the left posterior parietal lobe. There are new trace bilateral subdural hygromas with minimal superimposed hyperdense hemorrhage. Ventriculomegaly involving the  lateral and third ventricles is again identified with slight decrease in size since the prior study. 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 No new loss of gray-whi

## 2022-01-04 NOTE — Telephone Encounter (Signed)
Transition Care Management Follow-up Telephone Call ?Date of discharge and from where: 01/02/2022 - [facility] - left AMA and then returned to ED twice on 01/02/2022 and is currently in ED again. ?

## 2022-01-04 NOTE — Assessment & Plan Note (Signed)
-  Positive on 3/8, last hospitalization ?-It was incidental at that time ?-No apparent symptoms ?-Will not place in isolation at this time ?

## 2022-01-04 NOTE — ED Triage Notes (Addendum)
Pt bib [facility] from home where he lives with a roommate with complaints of a fall today and a headache x4-5 days. Pt complains of extremity weakness and pain. Pt states he only takes diabetic medication and did not take it today because he did not think he needed to. Pt arrives covered in urine and yelling at staff for something to drink. Pt CBG with EMS is 586 ?

## 2022-01-04 NOTE — Progress Notes (Signed)
Update: ? ?Repeat labs show that gap is closed, CO2 has normalized.  Patient is demanding food.  Will feed and given Lantus and then turn off drip in 1 hour.  Will cover with moderate-scale SSI in addition to basal/bolus insulin. DM coordinator to consult tomorrow. ? ?Carlyon Shadow, M.D. ?

## 2022-01-04 NOTE — ED Provider Notes (Signed)
?[facility] ?Provider Note ? ? ?CSN: [id number] ?Arrival date & time:    ? ?  ? ?History ? ?Chief Complaint  ?Patient presents with  ? Fall  ? Hyperglycemia  ? ? ?Mark [name] is a 36 y.o. male. ? ?Pt is a 36 yo male with a hx of DM1, Bipolar d/o, anxiety, hydrocephalus, and seizures.  He was admitted from 3/8-3/14 for seizures and hypoglycemia.  He left AMA on 3/14, but came back to the ED later that night to get checked.  He was d/c to home then.  Pt said he has fallen several times and has a headache.  He did hit his head.  He also  has back pain.  He does not feel like anything is broken.  He said everything feels weak.  He is incontinent of urine this am, but no seizure activity reported by EMS.  Pt lives with a friend, but was recently homeless.  Pt has not been taking his insulin because he said he did not know he needed to take it every day.  CBG over 600 this morning.  He was + for Covid on 3/7, but had no sx then and has no fever/cough now. ? ? ?  ? ?Home Medications ?Prior to Admission medications   ?Medication Sig Start Date End Date Taking? Authorizing Provider  ?Blood Glucose Monitoring Suppl (ACCU-CHEK GUIDE) w/Device KIT 1 Device by Does not apply route daily. 12/13/21   Madalyn Rob, MD  ?buPROPion (WELLBUTRIN XL) 150 MG 24 hr tablet Take 1 tablet (150 mg total) by mouth every morning. Patient to take 1 tablet (150 mg total) for 6 days then 2 tablets (300 mg total)  daily ?Patient not taking: Reported on 12/29/2021 12/13/21 12/13/22  Madalyn Rob, MD  ?escitalopram (LEXAPRO) 20 MG tablet Take 1 tablet (20 mg total) by mouth daily. ?Patient not taking: Reported on 12/29/2021 12/13/21 01/12/22  Madalyn Rob, MD  ?glucose blood (ACCU-CHEK GUIDE) test strip Check blood glucose 4 times daily.Before breakfast,lunch and dinner. Also before bed time. 12/13/21   Madalyn Rob, MD  ?insulin isophane & regular human KwikPen (NOVOLIN 70/30 KWIKPEN) (70-30) 100 UNIT/ML KwikPen  Inject 45 Units into the skin daily before breakfast AND 35 Units daily before lunch AND 25 Units daily before supper. 12/13/21   Madalyn Rob, MD  ?Accu-Chek Softclix Lancets lancets Check blood glucose 3 times a day before meals and before bedtime. 12/13/21   Madalyn Rob, MD  ?levETIRAcetam (KEPPRA) 500 MG tablet Take 1 tablet (500 mg total) by mouth 2 (two) times daily. ?Patient not taking: Reported on 12/29/2021 12/13/21   Madalyn Rob, MD  ?levothyroxine (SYNTHROID) 50 MCG tablet Take 1 tablet (50 mcg total) by mouth daily before breakfast. ?Patient not taking: Reported on 12/29/2021 12/14/21   Madalyn Rob, MD  ?QUEtiapine (SEROQUEL) 200 MG tablet Take 2 tablets (400 mg total) by mouth at bedtime. ?Patient not taking: Reported on 12/29/2021 12/13/21 01/12/22  Madalyn Rob, MD  ?   ? ?Allergies    ?Mushroom extract complex, Penicillins, Sulfa antibiotics, and Clindamycin/lincomycin   ? ?Review of Systems   ?Review of Systems  ?Musculoskeletal:  Positive for back pain.  ?Neurological:  Positive for weakness and headaches.  ?All other systems reviewed and are negative. ? ?Physical Exam ?Updated Vital Signs ?BP 104/67   Pulse (!) 111   Temp 98 ?F (36.7 ?C) (Oral)   Resp 16   SpO2 97%  ?Physical Exam ?Vitals and nursing note reviewed.  ?  Constitutional:   ?   Appearance: Normal appearance.  ?HENT:  ?   Head: Normocephalic and atraumatic.  ?   Comments: Multiple dental caries.  Very poor dentition. ?   Right Ear: External ear normal.  ?   Left Ear: External ear normal.  ?   Nose: Nose normal.  ?   Mouth/Throat:  ?   Mouth: Mucous membranes are dry.  ?Eyes:  ?   Extraocular Movements: Extraocular movements intact.  ?   Conjunctiva/sclera: Conjunctivae normal.  ?   Pupils: Pupils are equal, round, and reactive to light.  ?Cardiovascular:  ?   Rate and Rhythm: Regular rhythm. Tachycardia present.  ?   Pulses: Normal pulses.  ?   Heart sounds: Normal heart sounds.  ?Pulmonary:  ?   Effort: Pulmonary effort is normal.  ?    Breath sounds: Normal breath sounds.  ?Abdominal:  ?   General: Abdomen is flat. Bowel sounds are normal.  ?   Palpations: Abdomen is soft.  ?Musculoskeletal:     ?   General: Normal range of motion.  ?   Cervical back: Normal range of motion and neck supple.  ?Skin: ?   General: Skin is warm.  ?   Capillary Refill: Capillary refill takes less than 2 seconds.  ?   Comments: Multiple abrasion to legs and arms  ?Neurological:  ?   General: No focal deficit present.  ?   Mental Status: He is alert and oriented to person, place, and time.  ?Psychiatric:     ?   Behavior: Behavior is agitated.  ? ? ?ED Results / Procedures / Treatments   ?Labs ?(all labs ordered are listed, but only abnormal results are displayed) ?Labs Reviewed  ?COMPREHENSIVE METABOLIC PANEL - Abnormal; Notable for the following components:  ?    Result Value  ? Sodium 132 (*)   ? Potassium 5.6 (*)   ? Chloride 91 (*)   ? CO2 11 (*)   ? Glucose, Bld 919 (*)   ? BUN 32 (*)   ? Creatinine, Ser 1.89 (*)   ? Total Bilirubin 1.7 (*)   ? GFR, Estimated 46 (*)   ? Anion gap 30 (*)   ? All other components within normal limits  ?CBC WITH DIFFERENTIAL/PLATELET - Abnormal; Notable for the following components:  ? WBC 15.9 (*)   ? RBC 4.16 (*)   ? Neutro Abs 14.3 (*)   ? All other components within normal limits  ?URINALYSIS, ROUTINE W REFLEX MICROSCOPIC - Abnormal; Notable for the following components:  ? Color, Urine STRAW (*)   ? Glucose, UA >=500 (*)   ? Hgb urine dipstick SMALL (*)   ? Ketones, ur 80 (*)   ? All other components within normal limits  ?BETA-HYDROXYBUTYRIC ACID - Abnormal; Notable for the following components:  ? Beta-Hydroxybutyric Acid >8.00 (*)   ? All other components within normal limits  ?TSH - Abnormal; Notable for the following components:  ? TSH 6.936 (*)   ? All other components within normal limits  ?CBG MONITORING, ED - Abnormal; Notable for the following components:  ? Glucose-Capillary >600 (*)   ? All other components within  normal limits  ?I-STAT VENOUS BLOOD GAS, ED - Abnormal; Notable for the following components:  ? pH, Ven 7.245 (*)   ? pCO2, Ven 36.1 (*)   ? pO2, Ven 120 (*)   ? Bicarbonate 15.6 (*)   ? TCO2 17 (*)   ? Acid-base deficit  11.0 (*)   ? Sodium 129 (*)   ? Potassium 5.6 (*)   ? Calcium, Ion 1.12 (*)   ? All other components within normal limits  ?CBG MONITORING, ED - Abnormal; Notable for the following components:  ? Glucose-Capillary >600 (*)   ? All other components within normal limits  ?CBG MONITORING, ED - Abnormal; Notable for the following components:  ? Glucose-Capillary >600 (*)   ? All other components within normal limits  ?VALPROIC ACID LEVEL  ? ? ?EKG ?EKG Interpretation ? ?Date/Time:  Thursday January 04 2022 08:44:17 EDT ?Ventricular Rate:  110 ?PR Interval:  147 ?QRS Duration: 76 ?QT Interval:  321 ?QTC Calculation: 435 ?R Axis:   85 ?Text Interpretation: Sinus tachycardia Right atrial enlargement Since last tracing rate faster Confirmed by Isla Pence [phone]) on 01/04/2022 9:01:59 AM ? ?Radiology ?DG Chest 1 View ? ?Result Date: 01/04/2022 ?CLINICAL DATA:  35 year old male with recent falls. Low back pain and weakness. EXAM: CHEST  1 VIEW COMPARISON:  Portable chest 12/28/2021 and earlier. FINDINGS: Portable AP supine view at 0903 hours. Improved, normal lung volumes. Normal cardiac size and mediastinal contours. Visualized tracheal air column is within normal limits. Allowing for portable technique the lungs are clear. No pneumothorax. No osseous abnormality identified. IMPRESSION: Negative portable chest. Electronically Signed   By: Genevie Ann M.D.   On: 01/04/2022 09:31  ? ?DG Lumbar Spine Complete ? ?Result Date: 01/04/2022 ?CLINICAL DATA:  36 year old male with recent falls. Low back pain and weakness. EXAM: LUMBAR SPINE - COMPLETE 4+ VIEW COMPARISON:  Lumbar radiographs 11/26/2010. FINDINGS: Normal lumbar segmentation. Bone mineralization is within normal limits. Mild dextroconvex lumbar scoliosis  since 2012. But stable lumbar lordosis. No pars fracture. No acute osseous abnormality identified. Visible sacrum and SI joints appear negative. Relatively preserved disc spaces, not significantly changed since 2

## 2022-01-05 ENCOUNTER — Inpatient Hospital Stay (HOSPITAL_COMMUNITY): Payer: Medicaid Other

## 2022-01-05 DIAGNOSIS — E1010 Type 1 diabetes mellitus with ketoacidosis without coma: Secondary | ICD-10-CM | POA: Diagnosis not present

## 2022-01-05 LAB — BASIC METABOLIC PANEL
Anion gap: 7 (ref 5–15)
BUN: 12 mg/dL (ref 6–20)
CO2: 26 mmol/L (ref 22–32)
Calcium: 8.7 mg/dL — ABNORMAL LOW (ref 8.9–10.3)
Chloride: 108 mmol/L (ref 98–111)
Creatinine, Ser: 1.03 mg/dL (ref 0.61–1.24)
GFR, Estimated: >60 mL/min (ref >60)
Glucose, Bld: 114 mg/dL — ABNORMAL HIGH (ref 70–99)
Potassium: 3.6 mmol/L (ref 3.5–5.1)
Sodium: 141 mmol/L (ref 135–145)

## 2022-01-05 LAB — GLUCOSE, CAPILLARY
Glucose-Capillary: 269 mg/dL — ABNORMAL HIGH (ref 70–99)
Glucose-Capillary: 97 mg/dL (ref 70–99)
Glucose-Capillary: 280 mg/dL — ABNORMAL HIGH (ref 70–99)
Glucose-Capillary: 283 mg/dL — ABNORMAL HIGH (ref 70–99)

## 2022-01-05 LAB — HEMOGLOBIN A1C
Hgb A1c MFr Bld: 8 % — ABNORMAL HIGH (ref 4.8–5.6)
Mean Plasma Glucose: 183 mg/dL

## 2022-01-05 LAB — VALPROIC ACID LEVEL: Valproic Acid Lvl: <10 ug/mL — ABNORMAL LOW (ref 50.0–100.0)

## 2022-01-05 MED ORDER — GLUCERNA SHAKE PO LIQD
237.0000 mL | Freq: Two times a day (BID) | ORAL | Status: DC
Start: 2022-01-05 — End: 2022-01-11
  Administered 2022-01-06 – 2022-01-11 (×11): 237 mL via ORAL

## 2022-01-05 MED ORDER — ENSURE MAX PROTEIN PO LIQD
11.0000 [oz_av] | Freq: Every day | ORAL | Status: DC
  Administered 2022-01-05 – 2022-01-10 (×6): 11 [oz_av] via ORAL
  Filled 2022-01-05 (×7): qty 330

## 2022-01-05 NOTE — Progress Notes (Signed)
Inpatient Diabetes Program Recommendations ? ?AACE/ADA: New Consensus Statement on Inpatient Glycemic Control (2015) ? ?Target Ranges:  Prepandial:   less than 140 mg/dL ?     Peak postprandial:   less than 180 mg/dL (1-2 hours) ?     Critically ill patients:  140 - 180 mg/dL  ? ?Lab Results  ?Component Value Date  ? GLUCAP 269 (H) 01/05/2022  ? HGBA1C 8.0 (H) 01/04/2022  ? ? ?Review of Glycemic Control ? Latest Reference Range & Units 01/04/22 16:06 01/04/22 18:23 01/04/22 22:19 01/05/22 08:06  ?Glucose-Capillary 70 - 99 mg/dL 258 (H) 198 (H) 262 (H) 269 (H)  ?(H): Data is abnormally high ?Diabetes history: Type 1 DM ?Outpatient Diabetes medications: Novolin 70/30 45 B/35 L/25 D ?Current orders for Inpatient glycemic control: Novolog 0-15 units TID & Hs, Semglee 30 units QD, Novolog 4 units TID ? ?Inpatient Diabetes Program Recommendations:   ? ?Noted consult. Dm team familiar with this patient. Recently left hospital AMA. Psychosocial barriers limiting control outpatient. ? ?Consider increasing Semglee to 40 units QD.  ? ?Thanks, ?Bronson Curb, MSN, RNC-OB ?Diabetes Coordinator ?[phone] (8a-5p) ? ? ? ?

## 2022-01-05 NOTE — Consult Note (Signed)
Reason for Consult: Subdural hematoma ?Referring Physician: Jacqulyn BathPahwani ? ?[name] is an 36 y.o. male.  ?HPI: Patient is a 36 year old male with a history of pulm diabetes who presented in diabetic ketoacidosis.  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  Patient CT scan shows also that the patient has marked ventriculomegaly but has a normal cortical sulcal pattern suggesting nonpressure state.  He has been noncompliant with medical care but currently is awake and alert responding appropriately. ? ?Past Medical History:  ?Diagnosis Date  ? Anxiety   ? Bipolar disorder (HCC)   ? Depression   ? Diabetes mellitus without complication (HCC)   ? Diabetic polyneuropathy associated with type 1 diabetes mellitus (HCC)   ? Hydrocephalus (HCC)   ? Kidney stones   ? Seizures (HCC)   ? Truncal ataxia   ? ? ?Past Surgical History:  ?Procedure Laterality Date  ? BRAIN SURGERY    ? hydrocephalus  ? KIDNEY STONE SURGERY    ? ? ?Family History  ?Problem Relation Age of Onset  ? Breast cancer Mother   ? Other Father   ?     unsure of medical history  ? Diabetes Paternal Aunt   ? ? ?Social History:  reports that he has been smoking cigarettes. He has a 20.00 pack-year smoking history. He has never used smokeless tobacco. He reports current alcohol use. He reports current drug use. Frequency: 7.00 times per week. Drug: Marijuana. ? ?Allergies:  ?Allergies  ?Allergen Reactions  ? Mushroom Extract Complex Hives, Itching and Nausea And Vomiting  ? Penicillins Anaphylaxis, Hives and Swelling  ?  Has patient had a PCN reaction causing immediate rash, facial/tongue/throat swelling, SOB or lightheadedness with hypotension: Yes ?Has patient had a PCN reaction causing severe rash involving mucus membranes or skin necrosis: No ?Has patient had a PCN reaction that required hospitalization: Yes ?Has patient had a PCN reaction occurring within the last 10 years: Yes ?If all of the above answers are  "NO", then may proceed with Cephalosporin use. ?Tolerated ceftriaxone 03/24/20  ? Sulfa Antibiotics Anaphylaxis and Hives  ? Clindamycin/Lincomycin Hives  ? ? ?Medications: I have reviewed the patient's current medications. ? ?Results for orders placed or performed during the hospital encounter of 01/04/22 (from the past 48 hour(s))  ?CBG monitoring, ED     Status: Abnormal  ? Collection Time: 01/04/22  8:29 AM  ?Result Value Ref Range  ? Glucose-Capillary >600 (HH) 70 - 99 mg/dL  ?  Comment: Glucose reference range applies only to samples taken after fasting for at least 8 hours.  ?Urinalysis, Routine w reflex microscopic Urine, Clean Catch     Status: Abnormal  ? Collection Time: 01/04/22  8:30 AM  ?Result Value Ref Range  ? Color, Urine STRAW (A) YELLOW  ? APPearance CLEAR CLEAR  ? Specific Gravity, Urine 1.021 1.005 - 1.030  ? pH 5.0 5.0 - 8.0  ? Glucose, UA >=500 (A) NEGATIVE mg/dL  ? Hgb urine dipstick SMALL (A) NEGATIVE  ? Bilirubin Urine NEGATIVE NEGATIVE  ? Ketones, ur 80 (A) NEGATIVE mg/dL  ? Protein, ur NEGATIVE NEGATIVE mg/dL  ? Nitrite NEGATIVE NEGATIVE  ? Leukocytes,Ua NEGATIVE NEGATIVE  ? RBC / HPF 0-5 0 - 5 RBC/hpf  ? WBC, UA 0-5 0 - 5 WBC/hpf  ? Bacteria, UA NONE SEEN NONE SEEN  ?  Comment: Performed at [facility], [address] [place], [place] [id number]  ?Comprehensive metabolic panel  Status: Abnormal  ? Collection Time: 01/04/22  8:43 AM  ?Result Value Ref Range  ? Sodium 132 (L) 135 - 145 mmol/L  ? Potassium 5.6 (H) 3.5 - 5.1 mmol/L  ? Chloride 91 (L) 98 - 111 mmol/L  ? CO2 11 (L) 22 - 32 mmol/L  ? Glucose, Bld 919 (HH) 70 - 99 mg/dL  ?  Comment: Glucose reference range applies only to samples taken after fasting for at least 8 hours. ?CRITICAL RESULT CALLED TO, READ BACK BY AND VERIFIED WITH: ?COBB,C RN @ [phone] 01/04/22 LEONARD,A ?  ? BUN 32 (H) 6 - 20 mg/dL  ? Creatinine, Ser 1.89 (H) 0.61 - 1.24 mg/dL  ? Calcium 9.7 8.9 - 10.3 mg/dL  ? Total Protein 7.7 6.5 - 8.1 g/dL  ? Albumin 4.6  3.5 - 5.0 g/dL  ? AST 36 15 - 41 U/L  ? ALT 28 0 - 44 U/L  ? Alkaline Phosphatase 83 38 - 126 U/L  ? Total Bilirubin 1.7 (H) 0.3 - 1.2 mg/dL  ? GFR, Estimated 46 (L) >60 mL/min  ?  Comment: (NOTE) ?Calculated using the CKD-EPI Creatinine Equation (2021) ?  ? Anion gap 30 (H) 5 - 15  ?  Comment: Electrolytes repeated to confirm. ?Performed at [facility], [address] [place], [place] [address] ?  ?CBC with Differential     Status: Abnormal  ? Collection Time: 01/04/22  8:43 AM  ?Result Value Ref Range  ? WBC 15.9 (H) 4.0 - 10.5 K/uL  ? RBC 4.16 (L) 4.22 - 5.81 MIL/uL  ? Hemoglobin 13.2 13.0 - 17.0 g/dL  ? HCT 41.3 39.0 - 52.0 %  ? MCV 99.3 80.0 - 100.0 fL  ? MCH 31.7 26.0 - 34.0 pg  ? MCHC 32.0 30.0 - 36.0 g/dL  ? RDW 12.2 11.5 - 15.5 %  ? Platelets 327 150 - 400 K/uL  ? nRBC 0.0 0.0 - 0.2 %  ? Neutrophils Relative % 90 %  ? Neutro Abs 14.3 (H) 1.7 - 7.7 K/uL  ? Lymphocytes Relative 6 %  ? Lymphs Abs 0.9 0.7 - 4.0 K/uL  ? Monocytes Relative 4 %  ? Monocytes Absolute 0.6 0.1 - 1.0 K/uL  ? Eosinophils Relative 0 %  ? Eosinophils Absolute 0.0 0.0 - 0.5 K/uL  ? Basophils Relative 0 %  ? Basophils Absolute 0.1 0.0 - 0.1 K/uL  ? Immature Granulocytes 0 %  ? Abs Immature Granulocytes 0.06 0.00 - 0.07 K/uL  ?  Comment: Performed at [facility], [address] [place], [place] [address]  ?Beta-hydroxybutyric acid     Status: Abnormal  ? Collection Time: 01/04/22  8:45 AM  ?Result Value Ref Range  ? Beta-Hydroxybutyric Acid >8.00 (H) 0.05 - 0.27 mmol/L  ?  Comment: RESULTS CONFIRMED BY MANUAL DILUTION ?Performed at [facility], [address] [place], [place] [address] ?  ?TSH     Status: Abnormal  ? Collection Time: 01/04/22  8:45 AM  ?Result Value Ref Range  ? TSH 6.936 (H) 0.350 - 4.500 uIU/mL  ?  Comment: Performed by a 3rd Generation assay with a functional sensitivity of <=0.01 uIU/mL. ?Performed at [facility], [address], [place], [place] [address] ?  ?Valproic acid level      Status: Abnormal  ? Collection Time: 01/04/22  8:45 AM  ?Result Value Ref Range  ? Valproic Acid Lvl <10 (L) 50.0 - 100.0 ug/mL  ?  Comment: RESULTS  CONFIRMED BY MANUAL DILUTION ?Performed at [facility], [address], [place], [place] [address] ?  ?I-Stat venous blood gas, [facility] ED)     Status: Abnormal  ? Collection Time: 01/04/22  9:00 AM  ?Result Value Ref Range  ? pH, Ven 7.245 (L) 7.25 - 7.43  ? pCO2, Ven 36.1 (L) 44 - 60 mmHg  ? pO2, Ven 120 (H) 32 - 45 mmHg  ? Bicarbonate 15.6 (L) 20.0 - 28.0 mmol/L  ? TCO2 17 (L) 22 - 32 mmol/L  ? O2 Saturation 98 %  ? Acid-base deficit 11.0 (H) 0.0 - 2.0 mmol/L  ? Sodium 129 (L) 135 - 145 mmol/L  ? Potassium 5.6 (H) 3.5 - 5.1 mmol/L  ? Calcium, Ion 1.12 (L) 1.15 - 1.40 mmol/L  ? HCT 41.0 39.0 - 52.0 %  ? Hemoglobin 13.9 13.0 - 17.0 g/dL  ? Sample type VENOUS   ?CBG monitoring, ED     Status: Abnormal  ? Collection Time: 01/04/22  9:43 AM  ?Result Value Ref Range  ? Glucose-Capillary >600 (HH) 70 - 99 mg/dL  ?  Comment: Glucose reference range applies only to samples taken after fasting for at least 8 hours.  ? Comment 1 Notify RN   ? Comment 2 Document in Chart   ?CBG monitoring, ED     Status: Abnormal  ? Collection Time: 01/04/22 10:27 AM  ?Result Value Ref Range  ? Glucose-Capillary >600 (HH) 70 - 99 mg/dL  ?  Comment: Glucose reference range applies only to samples taken after fasting for at least 8 hours.  ? Comment 1 Notify RN   ? Comment 2 Document in Chart   ?CBG monitoring, ED     Status: Abnormal  ? Collection Time: 01/04/22 10:59 AM  ?Result Value Ref Range  ? Glucose-Capillary >600 (HH) 70 - 99 mg/dL  ?  Comment: Glucose reference range applies only to samples taken after fasting for at least 8 hours.  ? Comment 1 Notify RN   ? Comment 2 Document in Chart   ?Basic metabolic panel     Status: Abnormal  ? Collection Time: 01/04/22 11:01 AM  ?Result Value Ref Range  ? Sodium 136 135 - 145 mmol/L  ?  Comment: REPEATED TO VERIFY ?DELTA CHECK NOTED ?  ?  Potassium 4.1 3.5 - 5.1 mmol/L  ?  Comment: DELTA CHECK NOTED ?NO VISIBLE HEMOLYSIS ?  ? Chloride 104 98 - 111 mmol/L  ? CO2 11 (L) 22 - 32 mmol/L  ? Glucose, Bld 645 (HH) 70 - 99 mg/dL  ?  Comment: Glucose refer

## 2022-01-05 NOTE — TOC CAGE-AID Note (Signed)
Transition of Care (TOC) - CAGE-AID Screening ? ? ?Patient Details  ?Name: Mark [name] ?MRN: [id number] ?Date of Birth: 1986-08-29 ? ?Transition of Care (TOC) CM/SW Contact:    ?[place] Fant C Tarpley-Carter, LCSWA ?Phone Number: ?01/05/2022, 3:05 PM ? ? ?Clinical Narrative: ?Pt is unable to participate in Cage Aid. ?Pt refused assessment. ? ?Insurance underwriter, MSW, LCSW-A ?Pronouns:  She/Her/Hers ?[facility] ?Clinical Social Worker ?Direct Number:  [phone] ?[email or web address]  ? ? ?CAGE-AID Screening: ?Substance Abuse Screening unable to be completed due to: : Patient Refused ? ?  ?  ?  ?  ?  ? ?Substance Abuse Education Offered: Yes ? ?Substance abuse interventions: Educational Materials ? ? ? ? ? ? ?

## 2022-01-05 NOTE — Progress Notes (Signed)
Initial Nutrition Assessment ? ?DOCUMENTATION CODES:  ? ?Not applicable ? ?INTERVENTION:  ? ?-MVI with minerals daily ?-Glucerna Shake po BID, each supplement provides 220 kcal and 10 grams of protein  ?-Ensure Max po daily, each supplement provides 150 kcal and 30 grams of protein ? ?NUTRITION DIAGNOSIS:  ? ?Increased nutrient needs related to chronic illness as evidenced by estimated needs. ? ?GOAL:  ? ?Patient will meet greater than or equal to 90% of their needs ? ?MONITOR:  ? ?PO intake, Supplement acceptance, Labs, Weight trends, Skin, I & O's ? ?REASON FOR ASSESSMENT:  ? ?Consult ?Assessment of nutrition requirement/status ? ?ASSESSMENT:  ? ?Mark [name] is a 36 y.o. male with medical history significant of seizures; hydrocephalus; and DM presenting with a fall.  He was last admitted fro 3/8-14 with AMS and seizures and left AMA.  He returned later that day to get checked out again; he was discharged with plan for outpatient neurology and psychiatry f/u.  He was pleasant at the time of my admission but looks significantly older than when I last admitted him.  He was calm, having recently received parenteral opiates.  He did not take his insulin after last d/c.  He returned today with frequent falls, generalized weakness, and pain.  He has a h/o erratic and aggressive behavior. ? ?Pt admitted with DKA.  ? ?Reviewed I/O's: +536 ml x 24 hours ? ?UOP: 700 ml x 24 hours ? ?Pt unavailable at time of visit. Attempted to speak with pt via call to hospital room phone, however, unable to reach. RD unable to obtain further nutrition-related history or complete nutrition-focused physical exam at this time.   ? ?Pt currently on a carb modified diet. Pt with good oral intake. Noted meal completions 100%.  ? ?Reviewed wt hx; pt has experienced a 6.6% wt loss over the past year, which is not significant for time frame.  ? ?Medications reviewed and include lexapro, folic acid, keppra, thiamine, and lactated ringers  infusion @ 100 ml/hr.  ? ?Lab Results  ?Component Value Date  ? HGBA1C 8.0 (H) 01/04/2022  ? PTA DM medications are novolin 70/30 (45 units at breakfast, 35 units at lunch, and 25 units at dinner.  ? ?Labs reviewed: CBGS: 97-269 (inpatient orders for glycemic control are 0-15 units insulin aspart TID with meals, 0-5 units insulin aspart daily at bedtime, 4 units insulin asparts TID with meals, and 30 units insulin glargine-yfgn daily).   ? ?Diet Order:   ?Diet Order   ? ?       ?  Diet Carb Modified Fluid consistency: Thin; Room service appropriate? No  Diet effective now       ?  ? ?  ?  ? ?  ? ? ?EDUCATION NEEDS:  ? ?No education needs have been identified at this time ? ?Skin:  Skin Assessment: Reviewed RN Assessment ? ?Last BM:  Unknown ? ?Height:  ? ?Ht Readings from Last 1 Encounters:  ?01/04/22 6\' 2"  (1.88 m)  ? ? ?Weight:  ? ?Wt Readings from Last 1 Encounters:  ?01/04/22 95.7 kg  ? ? ?Ideal Body Weight:  86.4 kg ? ?BMI:  Body mass index is 27.09 kg/m?. ? ?Estimated Nutritional Needs:  ? ?Kcal:  2150-2350 ? ?Protein:  115-130 grams ? ?Fluid:  > 2 L ? ? ? ?09-29-1990, RD, LDN, CDCES ?Registered Dietitian II ?Certified Diabetes Care and Education Specialist ?Please refer to [facility] for RD and/or RD on-call/weekend/after hours pager  ?

## 2022-01-05 NOTE — Progress Notes (Signed)
?  Transition of Care (TOC) Screening Note ? ? ?Patient Details  ?Name: Mark [name] ?Date of Birth: February 08, 1986 ? ? ?Transition of Care (TOC) CM/SW Contact:    ?Benard Halsted, LCSW ?Phone Number: ?01/05/2022, 8:26 AM ? ? ? ?Transition of Care Department [facility]) has reviewed patient. We will continue to monitor patient advancement through interdisciplinary progression rounds.  ? ? ?

## 2022-01-05 NOTE — Progress Notes (Addendum)
?PROGRESS NOTE ? ? ? ?Mark [name]  ZOX:[id number] DOB: 03/28/1986 DOA: 01/04/2022 ?PCP: Rema FendtStephens, Amy J, NP ? ? ?Brief Narrative:  ?Mark [name] is a 36 y.o. male with medical history significant of seizures; hydrocephalus; and DM presenting with a fall.  He was last admitted fro 3/8-14 with AMS and seizures and left AMA.  He returned later that day to get checked out again; he was discharged with plan for outpatient neurology and psychiatry f/u.  He did not take his insulin after last d/c.  He returned today with frequent falls, generalized weakness, and pain.  He has a h/o erratic and aggressive behavior. ? ?ER Course:  Here with DKA, not taking insulin since he went home.  Also with SDH/SAH from frequent falls.  Neurosurgery consulted, unlikely to intervene. ?  ?Assessment & Plan: ? ?DKA (diabetic ketoacidosis) (HCC) ?-Uncontrolled diabetes with last A1c 8.0%.  Likely secondary to noncompliance. ?-Moderate DKA on admission based on pH 7.245, HCO3 11, anion gap 30, patient alert ?-Patient started on DKA protocol-insulin drip IV fluids, electrolyte replacement ?-Gap closed.  Labs from this morning is pending ?-Currently on NovoLog 0 to 15 units 3 times daily, Semglee 30 units once daily and NovoLog 4 units 3 times daily. ?-Diabetic coordinator and TOC consulted ?-Monitor blood sugar closely ?  ?ICH (intracerebral hemorrhage) (HCC) ?-Recurrent falls ?-Small SAH and minimal SDH noted on CT ?-EDP discussed with neurosurgery who recommends repeat CT in AM; he does not need surgical intervention at this time ?-Repeat CT on 3/17 shows small volume left posterior hemisphere subarachnoid hemorrhage appears stable aside from trace redistribution into the ventricles.  Bilateral subdural collection measuring 3 to 4 mm are stable from yesterday but new since March 8.  No skull fracture.   ?-Discussed with neurosurgery Dr. Danielle DessElsner: Likely stable for the discharge from neurosurgery standpoint-follow-up outpatient. ?-We will  consult PT/OT ?  ?Other bipolar disorder (HCC) ?-Psychiatry was consulted during last hospitalization ?-He has intermittent angry outbursts at baseline ?-As per plan last hospitalization, continue Depakote 500 mg PO BID, Lexapro and Seroquel ?  ?Hypothyroidism ?-Continue Synthroid ?-TSH is high.  Likely secondary to noncompliance with Synthyroid.  Continue current dose of Synthyroid and repeat TSH in 6 weeks outpatient with PCP ? ?COVID-19 virus infection ?-Positive on 3/8, last hospitalization ?-Likely incidental.  He is on room air and denies any respiratory symptoms ?-No isolation needed at this time ?  ?Lytic lesion of bone on x-ray ?-Found incidentally at T7 during last hospitalization ?-He was planned for MRI but was uncooperative and so MRI was deferred to PCP ?  ?Seizures (HCC) ?-h/o seizures ?-recent negative EEG ?-He is supposed to taper off Keppra and stop Wellbutrin and be on Depakote 500 mg BID but there is no evidence of him taking Depakote at this time. ?-Continue Keppra and Depakote-Taper off Keppra once Depakote level is therapeutic. ?  ?Tobacco dependence ?-Encourage cessation.   ?-Patch ordered ?  ?Polysubstance abuse (HCC) ?-Recommend cessation. ? ?Patient is noncompliant with his medications.  He left AMA on previous hospitalization.  He is at increased risk of morbidity and mortality. ? ?DVT prophylaxis: SCD ?Code Status: Full code ?Family Communication:  None present at bedside.  Plan of care discussed with patient in length and he verbalized understanding and agreed with it. ?Disposition Plan: To be determined ? ?Consultants:  ?Neurosurgery ? ?Procedures:  ?None ? ?Antimicrobials:  ?None ? ?Status is: Inpatient ? ? ? ?Subjective: ?Patient seen and examined.  Resting comfortably on the  bed.  Denies any complaints.  No headache, blurry vision, nausea, vomiting, abdominal pain.  He just finished his breakfast however asking for more breakfast.  No acute events overnight. ? ?Objective: ?Vitals:   ? 01/05/22 0000 01/05/22 0400 01/05/22 0753 01/05/22 1148  ?BP: (!) 96/55 (!) 102/53 94/64 91/61   ?Pulse: 91 89 88 81  ?Resp: 15 17 15 15   ?Temp: 97.7 ?F (36.5 ?C) 97.8 ?F (36.6 ?C) 98.2 ?F (36.8 ?C) 97.6 ?F (36.4 ?C)  ?TempSrc: Axillary Axillary Oral Oral  ?SpO2: 93% 96% 97% 97%  ?Weight:      ?Height:      ? ? ?Intake/Output Summary (Last 24 hours) at 01/05/2022 1230 ?Last data filed at 01/05/2022 0700 ?Gross per 24 hour  ?Intake 1236.3 ml  ?Output 700 ml  ?Net 536.3 ml  ? ?Filed Weights  ? 01/04/22 1936  ?Weight: 95.7 kg  ? ? ?Examination: ? ?General exam: Appears calm and comfortable, appears older than stated age.  Following commands.  Poor dentition. ?Respiratory system: Clear to auscultation. Respiratory effort normal. ?Cardiovascular system: S1 & S2 heard, RRR. No JVD, murmurs, rubs, gallops or clicks. No pedal edema. ?Gastrointestinal system: Abdomen is nondistended, soft and nontender. No organomegaly or masses felt. Normal bowel sounds heard. ?Central nervous system: Alert and oriented. No focal neurological deficits. ?Extremities: Symmetric 5 x 5 power. ?Skin: No rashes, lesions or ulcers ?Psychiatry: Judgement and insight appear normal. Mood & affect appropriate.  ? ? ?Data Reviewed: I have personally reviewed following labs and imaging studies ? ?CBC: ?Recent Labs  ?Lab 12/30/21 ?0139 12/31/21 ?0045 01/01/22 ?01/03/22 01/02/22 ?1854 01/04/22 ?01/06/22 01/04/22 ?0900  ?WBC 9.3 8.2 8.1 9.6 15.9*  --   ?NEUTROABS  --   --   --  6.8 14.3*  --   ?HGB 12.4* 12.4* 13.5 15.0 13.2 13.9  ?HCT 36.0* 36.0* 38.7* 44.9 41.3 41.0  ?MCV 91.4 91.4 91.7 95.1 99.3  --   ?PLT 276 267 274 311 327  --   ? ?Basic Metabolic Panel: ?Recent Labs  ?Lab 12/30/21 ?0139 12/31/21 ?0045 01/01/22 ?01/03/22 01/01/22 ?1033 01/02/22 ?1854 01/04/22 ?01/06/22 01/04/22 ?0900 01/04/22 ?1101 01/04/22 ?1730  ?NA 137 135 139 139 135 132* 129* 136 140  ?K 3.4* 4.3 4.8 4.3 5.1 5.6* 5.6* 4.1 4.1  ?CL 102 98 103 104 98 91*  --  104 106  ?CO2 28 28 27 26 29  11*  --   11* 23  ?GLUCOSE 178* 344* 264* 206* 222* 919*  --  645* 199*  ?BUN 7 9 13 14 14  32*  --  31* 24*  ?CREATININE 0.84 1.01 1.02 0.99 1.17 1.89*  --  1.83* 1.20  ?CALCIUM 8.9 9.1 9.0 9.2 9.9 9.7  --  8.7* 9.2  ?MG 1.9 1.6* 1.8  --   --   --   --   --   --   ?PHOS 2.6 3.0 3.6  --   --   --   --   --   --   ? ?GFR: ?Estimated Creatinine Clearance: 97 mL/min (by C-G formula based on SCr of 1.2 mg/dL). ?Liver Function Tests: ?Recent Labs  ?Lab 01/01/22 ?1033 01/02/22 ?1854 01/04/22 ?01/03/22  ?AST 17 23 36  ?ALT 16 17 28   ?ALKPHOS 58 80 83  ?BILITOT 0.5 0.4 1.7*  ?PROT 6.4* 8.0 7.7  ?ALBUMIN 3.7 4.5 4.6  ? ?No results for input(s): LIPASE, AMYLASE in the last 168 hours. ?No results for input(s): AMMONIA in the last 168 hours. ?Coagulation Profile: ?  No results for input(s): INR, PROTIME in the last 168 hours. ?Cardiac Enzymes: ?No results for input(s): CKTOTAL, CKMB, CKMBINDEX, TROPONINI in the last 168 hours. ?BNP (last 3 results) ?No results for input(s): PROBNP in the last 8760 hours. ?HbA1C: ?Recent Labs  ?  01/04/22 ?1237  ?HGBA1C 8.0*  ? ?CBG: ?Recent Labs  ?Lab 01/04/22 ?1606 01/04/22 ?1823 01/04/22 ?2219 01/05/22 ?0806 01/05/22 ?1206  ?GLUCAP 258* 198* 262* 269* 97  ? ?Lipid Profile: ?No results for input(s): CHOL, HDL, LDLCALC, TRIG, CHOLHDL, LDLDIRECT in the last 72 hours. ?Thyroid Function Tests: ?Recent Labs  ?  01/04/22 ?0845  ?TSH 6.936*  ? ?Anemia Panel: ?No results for input(s): VITAMINB12, FOLATE, FERRITIN, TIBC, IRON, RETICCTPCT in the last 72 hours. ?Sepsis Labs: ?No results for input(s): PROCALCITON, LATICACIDVEN in the last 168 hours. ? ?Recent Results (from the past 240 hour(s))  ?Resp Panel by RT-PCR (Flu A&B, Covid) Nasopharyngeal Swab     Status: Abnormal  ? Collection Time: 12/27/21  7:21 PM  ? Specimen: Nasopharyngeal Swab; Nasopharyngeal(NP) swabs in vial transport medium  ?Result Value Ref Range Status  ? SARS Coronavirus 2 by RT PCR POSITIVE (A) NEGATIVE Final  ?  Comment: (NOTE) ?SARS-CoV-2 target  nucleic acids are DETECTED. ? ?The SARS-CoV-2 RNA is generally detectable in upper respiratory ?specimens during the acute phase of infection. Positive results are ?indicative of the presence of the

## 2022-01-06 DIAGNOSIS — E1010 Type 1 diabetes mellitus with ketoacidosis without coma: Secondary | ICD-10-CM | POA: Diagnosis not present

## 2022-01-06 LAB — BASIC METABOLIC PANEL
Anion gap: 10 (ref 5–15)
BUN: 12 mg/dL (ref 6–20)
CO2: 26 mmol/L (ref 22–32)
Calcium: 8.9 mg/dL (ref 8.9–10.3)
Chloride: 103 mmol/L (ref 98–111)
Creatinine, Ser: 1.1 mg/dL (ref 0.61–1.24)
GFR, Estimated: >60 mL/min (ref >60)
Glucose, Bld: 274 mg/dL — ABNORMAL HIGH (ref 70–99)
Potassium: 3.7 mmol/L (ref 3.5–5.1)
Sodium: 139 mmol/L (ref 135–145)

## 2022-01-06 LAB — CBC
HCT: 30.2 % — ABNORMAL LOW (ref 39.0–52.0)
Hemoglobin: 10.4 g/dL — ABNORMAL LOW (ref 13.0–17.0)
MCH: 31.9 pg (ref 26.0–34.0)
MCHC: 34.4 g/dL (ref 30.0–36.0)
MCV: 92.6 fL (ref 80.0–100.0)
Platelets: 238 10*3/uL (ref 150–400)
RBC: 3.26 MIL/uL — ABNORMAL LOW (ref 4.22–5.81)
RDW: 12.2 % (ref 11.5–15.5)
WBC: 10 10*3/uL (ref 4.0–10.5)
nRBC: 0 % (ref 0.0–0.2)

## 2022-01-06 LAB — GLUCOSE, CAPILLARY
Glucose-Capillary: 197 mg/dL — ABNORMAL HIGH (ref 70–99)
Glucose-Capillary: 76 mg/dL (ref 70–99)
Glucose-Capillary: 117 mg/dL — ABNORMAL HIGH (ref 70–99)
Glucose-Capillary: 398 mg/dL — ABNORMAL HIGH (ref 70–99)

## 2022-01-06 LAB — BETA-HYDROXYBUTYRIC ACID: Beta-Hydroxybutyric Acid: 0.14 mmol/L (ref 0.05–0.27)

## 2022-01-06 LAB — MAGNESIUM: Magnesium: 1.6 mg/dL — ABNORMAL LOW (ref 1.7–2.4)

## 2022-01-06 MED ORDER — MAGNESIUM SULFATE 2 GM/50ML IV SOLN
2.0000 g | Freq: Once | INTRAVENOUS | Status: AC
  Administered 2022-01-06: 2 g via INTRAVENOUS
  Filled 2022-01-06: qty 50

## 2022-01-06 NOTE — Evaluation (Signed)
Physical Therapy Evaluation ?Patient Details ?Name: Mark [name] ?MRN: [id number] ?DOB: 12-Jun-1986 ?Today's Date: 01/06/2022 ? ?History of Present Illness ? 36 y.o. male presenting 01/04/22 with a fall. 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 Neurosurgery consult with no interventions indicated.  He was last admitted fro 3/8-14 with AMS and seizures and left AMA.  PMH significant of seizures; hydrocephalus; DM; polyneuropathy, bipolar disorder  ?Clinical Impression ?  ?Pt admitted secondary to problem above with deficits below. Patient's prior functional status is unclear, however he was able to mobilize well enough in past week to leave hospital AMA. Pt currently requires 2 person assist to stand and is unsafe to ambulate due to agitation and orthostasis. Anticipate mobility will improve as pt moves through DTs.  Anticipate patient will benefit from PT to address problems listed below.Will continue to follow acutely to maximize functional mobility independence and safety.   ?   ? Supine 127/65 HR 110 ?Sitting 94/63    HR 118 ?Sitting after standing x 3 minutes 94/68  HR 125 *pt reporting dizziness while sitting and standing  ? ?Recommendations for follow up therapy are one component of a multi-disciplinary discharge planning process, led by the attending physician.  Recommendations may be updated based on patient status, additional functional criteria and insurance authorization. ? ?Follow Up Recommendations Skilled nursing-short term rehab (<3 hours/day) ? ?  ?Assistance Recommended at Discharge Frequent or constant Supervision/Assistance  ?Patient can return home with the following ? Assistance with cooking/housework;Direct supervision/assist for medications management;Direct supervision/assist for financial management;Assist for transportation;Help with stairs or ramp for entrance;Two people to help with walking and/or transfers;Two people to help with  bathing/dressing/bathroom ? ?  ?Equipment Recommendations Other (comment) (TBD as pt moves through DTs)  ?Recommendations for Other Services ?    ?  ?Functional Status Assessment Patient has had a recent decline in their functional status and demonstrates the ability to make significant improvements in function in a reasonable and predictable amount of time.  ? ?  ?Precautions / Restrictions Precautions ?Precautions: Fall ?Precaution Comments: seizure ?Restrictions ?Weight Bearing Restrictions: No  ? ?  ? ?Mobility ? Bed Mobility ?Overal bed mobility: Needs Assistance ?Bed Mobility: Supine to Sit, Sit to Supine ?  ?  ?Supine to sit: Min guard ?Sit to supine: Min assist ?  ?General bed mobility comments: minguard for safety to progress to sitting EOB, pt impulsively returned to supine but required assistance from therapist to not fall off the side of the bed ?  ? ?Transfers ?Overall transfer level: Needs assistance ?Equipment used: 2 person hand held assist, Rolling walker (2 wheels) ?Transfers: Sit to/from Stand ?Sit to Stand: Min assist, +2 physical assistance, +2 safety/equipment ?  ?  ?  ?  ?  ?General transfer comment: extremely unsteady, cues for sequencing and safety ?  ? ?Ambulation/Gait ?  ?  ?  ?  ?  ?  ?  ?General Gait Details: pt with posterior leaning and with difficulty maintaining bil knee extension, therefore unsafe to ambulate (additionally pt becoming agitated in standing and yelling at therapists) ? ?Stairs ?  ?  ?  ?  ?  ? ?Wheelchair Mobility ?  ? ?Modified Rankin (Stroke Patients Only) ?  ? ?  ? ?Balance Overall balance assessment: Needs assistance ?Sitting-balance support: Feet supported ?Sitting balance-Leahy Scale: Poor ?Sitting balance - Comments: several posterior LOB ?Postural control: Posterior lean ?Standing balance support: During functional activity, Bilateral upper extremity supported ?Standing balance-Leahy  Scale: Poor ?Standing balance comment: reliant on exeternal support ?  ?  ?   ?  ?  ?  ?  ?  ?  ?  ?  ?   ? ? ? ?Pertinent Vitals/Pain Pain Assessment ?Pain Assessment: Faces ?Faces Pain Scale: Hurts whole lot ?Pain Location: back and buttocks ?Pain Descriptors / Indicators: Sore, Moaning, Guarding ?Pain Intervention(s): Limited activity within patient's tolerance, Monitored during session, Repositioned  ? ? ?Home Living Family/patient expects to be discharged to:: Private residence ?Living Arrangements: Non-relatives/Friends ?Available Help at Discharge: Friend(s);Available 24 hours/day ?Type of Home: House ?Home Access: Stairs to enter ?  ?Entrance Stairs-Number of Steps: 3 ?  ?Home Layout: One level ?  ?Additional Comments: unsure reliability of information provided by pt  ?  ?Prior Function Prior Level of Function : Independent/Modified Independent;Patient poor historian/Family not available ?  ?  ?  ?  ?  ?  ?Mobility Comments: no AD ?ADLs Comments: indep, does not work, does not drive ?  ? ? ?Hand Dominance  ? Dominant Hand: Right ? ?  ?Extremity/Trunk Assessment  ? Upper Extremity Assessment ?Upper Extremity Assessment: Defer to OT evaluation ?LUE Deficits / Details: unable to assess secondary to behavior/cognition ?  ? ?Lower Extremity Assessment ?Lower Extremity Assessment: Generalized weakness ?RLE Coordination: decreased gross motor;decreased fine motor ?LLE Coordination: decreased fine motor;decreased gross motor ?  ? ?Cervical / Trunk Assessment ?Cervical / Trunk Assessment: Normal  ?Communication  ? Communication: Expressive difficulties (slow slurred speech)  ?Cognition Arousal/Alertness: Lethargic ?Behavior During Therapy: Agitated, Restless ?Overall Cognitive Status: Impaired/Different from baseline ?Area of Impairment: Attention, Following commands, Memory, Safety/judgement, Awareness, Problem solving ?  ?  ?  ?  ?  ?  ?  ?  ?  ?Current Attention Level: Focused ?Memory: Decreased short-term memory ?Following Commands: Follows one step commands inconsistently, Follows one  step commands with increased time ?Safety/Judgement: Decreased awareness of safety, Decreased awareness of deficits ?Awareness: Intellectual ?Problem Solving: Slow processing, Decreased initiation, Requires tactile cues, Requires verbal cues, Difficulty sequencing ?General Comments: pt with decreased awareness to saturated linens under him, he demonstrates decreased safety awareness and decreased awareness of increased need for assistance. Pt with significant instability with standing, did not communicate with staff and stated instability cause by "I got 4 people around me". Pt required cues for sequencing with progressing toward HOB. Pt easily agitated and communicated he was becoming frustrated during session, therefore limited cognition questions to assist with managing agitation ?  ?  ? ?  ?General Comments General comments (skin integrity, edema, etc.): RN in to assist with changing pt's linens and gown as apparently pt responds better to male caregivers. ? ?  ?Exercises    ? ?Assessment/Plan  ?  ?PT Assessment Patient needs continued PT services  ?PT Problem List Decreased strength;Decreased activity tolerance;Decreased mobility;Decreased coordination;Decreased balance;Decreased cognition;Decreased knowledge of use of DME;Decreased safety awareness;Decreased knowledge of precautions ? ?   ?  ?PT Treatment Interventions DME instruction;Gait training;Functional mobility training;Therapeutic activities;Stair training;Therapeutic exercise;Balance training;Neuromuscular re-education;Patient/family education   ? ?PT Goals (Current goals can be found in the Care Plan section)  ?Acute Rehab PT Goals ?Patient Stated Goal: did not state ?PT Goal Formulation: Patient unable to participate in goal setting ?Time For Goal Achievement: 01/20/22 ?Potential to Achieve Goals: Fair ? ?  ?Frequency Min 3X/week ?  ? ? ?Co-evaluation PT/OT/SLP Co-Evaluation/Treatment: Yes ?Reason for Co-Treatment: Complexity of the patient's  impairments (multi-system involvement);Necessary to address cognition/behavior during functional activity;For patient/therapist safety ?PT goals addressed during  session: Mobility/safety with mobility;Balance;Proper use o

## 2022-01-06 NOTE — Progress Notes (Addendum)
?PROGRESS NOTE ? ? ? ?Mark [name]  MWN:[id number] DOB: 02/28/1986 DOA: 01/04/2022 ?PCP: Rema Fendt, NP ? ? ?Brief Narrative:  ?Mark [name] is a 36 y.o. male with medical history significant of seizures; hydrocephalus; and DM presenting with a fall.  He was last admitted fro 3/8-14 with AMS and seizures and left AMA.  He returned later that day to get checked out again; he was discharged with plan for outpatient neurology and psychiatry f/u.  He did not take his insulin after last d/c.  He returned today with frequent falls, generalized weakness, and pain.  He has a h/o erratic and aggressive behavior. ? ?ER Course:  Here with DKA, not taking insulin since he went home.  Also with SDH/SAH from frequent falls.  Neurosurgery consulted, unlikely to intervene. ?  ?Assessment & Plan: ? ?DKA (diabetic ketoacidosis) (HCC) ?-Uncontrolled diabetes with last A1c 8.0%.  Likely secondary to noncompliance. ?-Moderate DKA on admission based on pH 7.245, HCO3 11, anion gap 30, patient alert ?-Patient started on DKA protocol-insulin drip IV fluids, electrolyte replacement ?-Gap closed.   ?-Currently on NovoLog 0 to 15 units 3 times daily, Semglee 30 units once daily and NovoLog 4 units 3 times daily. ?-Monitor blood sugar closely ?  ?ICH (intracerebral hemorrhage) (HCC) ?-Recurrent falls ?-Small SAH and minimal SDH noted on CT ?-EDP discussed with neurosurgery who recommends repeat CT in AM; he does not need surgical intervention at this time ?-Repeat CT on 3/17 shows small volume left posterior hemisphere subarachnoid hemorrhage appears stable aside from trace redistribution into the ventricles.  Bilateral subdural collection measuring 3 to 4 mm are stable from yesterday but new since March 8.  No skull fracture.   ?-Stable for the discharge from neurosurgery standpoint.  Follow-up outpatient recommended ?-PT/OT consulted-recommended SNF placement.  TOC consulted ? ?Other bipolar disorder (HCC) ?-Psychiatry was  consulted during last hospitalization ?-He has intermittent angry outbursts at baseline ?-As per plan last hospitalization, continue Depakote 500 mg PO BID, Lexapro and Seroquel ?  ?Hypothyroidism ?-Continue Synthroid ?-TSH is high-6.936.  Likely secondary to noncompliance with Synthyroid.  Continue current dose of Synthyroid and repeat TSH in 6 weeks outpatient with PCP ? ?COVID-19 virus infection ?-Positive on 3/8, last hospitalization ?-Likely incidental.  He is on room air and denies any respiratory symptoms ?-No isolation needed at this time ?  ?Lytic lesion of bone on x-ray ?-Found incidentally at T7 during last hospitalization ?-He was planned for MRI but was uncooperative and so MRI was deferred to PCP ?  ?Seizures (HCC) ?-recent negative EEG ?-He is supposed to taper off Keppra and stop Wellbutrin and be on Depakote 500 mg BID but there is no evidence of him taking Depakote at this time.  Valproic level: Less than 10 ?-Continue Keppra and Depakote-Taper off Keppra once Depakote level is therapeutic. ?  ?Tobacco dependence ?-Encourage cessation.   ?-Patch ordered ?  ?Polysubstance abuse (HCC) ?-Recommend cessation. ? ?Low blood pressure: ?-Patient's blood pressure noted to be in the 90s over 60s. ?-Continue IV fluids.  Monitor blood pressure closely ? ?Hypomagnesemia: Replenished.  Repeat magnesium level tomorrow a.m. ? ?Patient is noncompliant with his medications.  He left AMA on previous hospitalization.  He is at increased risk of morbidity and mortality. ? ?DVT prophylaxis: SCD ?Code Status: Full code ?Family Communication:  None present at bedside.  Plan of care discussed with patient in length and he verbalized understanding and agreed with it. ? ?Disposition Plan: SNF ? ?Consultants:  ?Neurosurgery ? ?  Procedures:  ?None ? ?Antimicrobials:  ?None ? ?Status is: Inpatient ? ? ? ?Subjective: ?Patient seen and examined.  While discussing  discharge plan (since neurosurgery recommended outpatient  follow-up.  DKA has resolved) He got aggressive and started cursing/using abusive language.  I left the room immediately. ? ?Objective: ?Vitals:  ? 01/05/22 2332 01/06/22 0400 01/06/22 0847 01/06/22 1158  ?BP:  106/60 91/61 94/68   ?Pulse: 97 96 100 99  ?Resp: 16 19 17 14   ?Temp:  97.6 ?F (36.4 ?C) 97.6 ?F (36.4 ?C) 97.6 ?F (36.4 ?C)  ?TempSrc: Oral Oral Oral Oral  ?SpO2: 100% 98% 95% 94%  ?Weight:      ?Height:      ? ? ?Intake/Output Summary (Last 24 hours) at 01/06/2022 1322 ?Last data filed at 01/06/2022 0850 ?Gross per 24 hour  ?Intake 2028.07 ml  ?Output 2150 ml  ?Net -121.93 ml  ? ? ?Filed Weights  ? 01/04/22 1936  ?Weight: 95.7 kg  ? ? ?Examination: ? ?I did not examine the patient due to patient's aggressive and unethical behavior. ? ? ?Data Reviewed: I have personally reviewed following labs and imaging studies ? ?CBC: ?Recent Labs  ?Lab 12/31/21 ?0045 01/01/22 ?01/03/22 01/02/22 ?1854 01/04/22 ?01/06/22 01/04/22 ?0900 01/06/22 ?0025  ?WBC 8.2 8.1 9.6 15.9*  --  10.0  ?NEUTROABS  --   --  6.8 14.3*  --   --   ?HGB 12.4* 13.5 15.0 13.2 13.9 10.4*  ?HCT 36.0* 38.7* 44.9 41.3 41.0 30.2*  ?MCV 91.4 91.7 95.1 99.3  --  92.6  ?PLT 267 274 311 327  --  238  ? ? ?Basic Metabolic Panel: ?Recent Labs  ?Lab 12/31/21 ?0045 01/01/22 ?01/03/22 01/01/22 ?1033 01/04/22 ?01/06/22 01/04/22 ?0900 01/04/22 ?1101 01/04/22 ?1730 01/05/22 ?1302 01/06/22 ?0025  ?NA 135 139   < > 132* 129* 136 140 141 139  ?K 4.3 4.8   < > 5.6* 5.6* 4.1 4.1 3.6 3.7  ?CL 98 103   < > 91*  --  104 106 108 103  ?CO2 28 27   < > 11*  --  11* 23 26 26   ?GLUCOSE 344* 264*   < > 919*  --  645* 199* 114* 274*  ?BUN 9 13   < > 32*  --  31* 24* 12 12  ?CREATININE 1.01 1.02   < > 1.89*  --  1.83* 1.20 1.03 1.10  ?CALCIUM 9.1 9.0   < > 9.7  --  8.7* 9.2 8.7* 8.9  ?MG 1.6* 1.8  --   --   --   --   --   --  1.6*  ?PHOS 3.0 3.6  --   --   --   --   --   --   --   ? < > = values in this interval not displayed.  ? ? ?GFR: ?Estimated Creatinine Clearance: 105.9 mL/min (by C-G formula  based on SCr of 1.1 mg/dL). ?Liver Function Tests: ?Recent Labs  ?Lab 01/01/22 ?1033 01/02/22 ?1854 01/04/22 ?01/04/22  ?AST 17 23 36  ?ALT 16 17 28   ?ALKPHOS 58 80 83  ?BILITOT 0.5 0.4 1.7*  ?PROT 6.4* 8.0 7.7  ?ALBUMIN 3.7 4.5 4.6  ? ? ?No results for input(s): LIPASE, AMYLASE in the last 168 hours. ?No results for input(s): AMMONIA in the last 168 hours. ?Coagulation Profile: ?No results for input(s): INR, PROTIME in the last 168 hours. ?Cardiac Enzymes: ?No results for input(s): CKTOTAL, CKMB, CKMBINDEX, TROPONINI in the last 168  hours. ?BNP (last 3 results) ?No results for input(s): PROBNP in the last 8760 hours. ?HbA1C: ?Recent Labs  ?  01/04/22 ?1237  ?HGBA1C 8.0*  ? ? ?CBG: ?Recent Labs  ?Lab 01/05/22 ?1206 01/05/22 ?1612 01/05/22 ?2025 01/06/22 ?[id number] 01/06/22 ?1157  ?GLUCAP 97 280* 283* 197* 76  ? ? ?Lipid Profile: ?No results for input(s): CHOL, HDL, LDLCALC, TRIG, CHOLHDL, LDLDIRECT in the last 72 hours. ?Thyroid Function Tests: ?Recent Labs  ?  01/04/22 ?0845  ?TSH 6.936*  ? ? ?Anemia Panel: ?No results for input(s): VITAMINB12, FOLATE, FERRITIN, TIBC, IRON, RETICCTPCT in the last 72 hours. ?Sepsis Labs: ?No results for input(s): PROCALCITON, LATICACIDVEN in the last 168 hours. ? ?Recent Results (from the past 240 hour(s))  ?Resp Panel by RT-PCR (Flu A&B, Covid) Nasopharyngeal Swab     Status: Abnormal  ? Collection Time: 12/27/21  7:21 PM  ? Specimen: Nasopharyngeal Swab; Nasopharyngeal(NP) swabs in vial transport medium  ?Result Value Ref Range Status  ? SARS Coronavirus 2 by RT PCR POSITIVE (A) NEGATIVE Final  ?  Comment: (NOTE) ?SARS-CoV-2 target nucleic acids are DETECTED. ? ?The SARS-CoV-2 RNA is generally detectable in upper respiratory ?specimens during the acute phase of infection. Positive results are ?indicative of the presence of the identified virus, but do not rule ?out bacterial infection or co-infection with other pathogens not ?detected by the test. Clinical correlation with patient history  and ?other diagnostic information is necessary to determine patient ?infection status. The expected result is Negative. ? ?Fact Sheet for Patients: ?BloggerCourse.comhttps://www.fda.gov/media/152166/download ? ?Fact Sheet for [facility]

## 2022-01-06 NOTE — Evaluation (Signed)
Occupational Therapy Evaluation Patient Details Name: Mark [name] MRN: [id number] DOB: [birth year] Today's Date: 01/06/2022   History of Present Illness 36 y.o. male presenting 01/04/22 with a fall. 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 Neurosurgery consult with no interventions indicated.  He was last admitted fro 3/8-14 with AMS and seizures and left AMA.  PMH significant of seizures; hydrocephalus; DM; polyneuropathy, bipolar disorder   Clinical Impression   Pt reports home information consistent with previous admission and still lives with his friend [name]. Upon arrival, pt was completely saturated in urine and appeared unaware. Therapists and RN assisted pt with washup and linen change. He demonstrates limitations with cognition, stability, strength, and activity tolerance. He required minA for safety while sitting EOB and reported dizziness and fatigue, BP below. Upon standing, pt demonstrated significant instability with notable posterior lean and poor awareness of safety and of deficits. Pt was limited to side stepping toward [facility] with moderate assistance +2 for physical assistance and cues for sequencing. Currently recommend d/c to short term rehab placement. Will continue to follow acutely.      Recommendations for follow up therapy are one component of a multi-disciplinary discharge planning process, led by the attending physician.  Recommendations may be updated based on patient status, additional functional criteria and insurance authorization.   Follow Up Recommendations  Skilled nursing-short term rehab (<3 hours/day)    Assistance Recommended at Discharge Frequent or constant Supervision/Assistance  Patient can return home with the following Assistance with cooking/housework;Direct supervision/assist for medications management;Assist for transportation;Help with stairs or ramp for entrance;A lot of help with walking and/or  transfers;Two people to help with walking and/or transfers;A lot of help with bathing/dressing/bathroom    Functional Status Assessment  Patient has had a recent decline in their functional status and demonstrates the ability to make significant improvements in function in a reasonable and predictable amount of time.  Equipment Recommendations  BSC/3in1    Recommendations for Other Services       Precautions / Restrictions Precautions Precautions: Fall Precaution Comments: seizure Restrictions Weight Bearing Restrictions: No      Mobility Bed Mobility Overal bed mobility: Needs Assistance Bed Mobility: Supine to Sit, Sit to Supine     Supine to sit: Min guard Sit to supine: Min assist   General bed mobility comments: minguard for safety to progress to sitting EOB, pt impulsively returned to supine but required assistance from therapist to stay in bed    Transfers Overall transfer level: Needs assistance Equipment used: 2 person hand held assist, Rolling walker (2 wheels) Transfers: Sit to/from Stand Sit to Stand: Min assist, +2 physical assistance, +2 safety/equipment     Step pivot transfers: +2 physical assistance, +2 safety/equipment, Mod assist     General transfer comment: extremely unsteady, cues for sequencing and safety      Balance Overall balance assessment: Needs assistance Sitting-balance support: Feet supported Sitting balance-Leahy Scale: Poor Sitting balance - Comments: several posterior LOB Postural control: Posterior lean Standing balance support: During functional activity, Bilateral upper extremity supported Standing balance-Leahy Scale: Poor Standing balance comment: reliant on exeternal support                           ADL either performed or assessed with clinical judgement   ADL Overall ADL's : Needs assistance/impaired Eating/Feeding: Set up;Sitting   Grooming: Minimal assistance;Sitting   Upper Body Bathing: Moderate  assistance;Sitting Upper Body  Bathing Details (indicate cue type and reason): RN assisting with washup Lower Body Bathing: Moderate assistance;Sit to/from stand Lower Body Bathing Details (indicate cue type and reason): pt with heavy reliance on BUE support in standing, required assistance from RN         Toilet Transfer: Moderate assistance;+2 for safety/equipment;Rolling walker (2 wheels) Toilet Transfer Details (indicate cue type and reason): unable to safely progress away from EOB 2/2 instability and decreased awareness. Pt required modA for stability and maxA for cues to take steps toward [facility]. +2 for safety Toileting- Clothing Manipulation and Hygiene: Maximal assistance Toileting - Clothing Manipulation Details (indicate cue type and reason): pt incontinent of bladder and condom cath appeared to be leaking. Pt was fully saturated in bed, RN present during session and assisting with bathing     Functional mobility during ADLs: Moderate assistance;+2 for physical assistance;+2 for safety/equipment General ADL Comments: pt with poor safety awareness and increased agitation impacting safety with mobility     Vision Baseline Vision/History: 0 No visual deficits Ability to See in Adequate Light: 0 Adequate Patient Visual Report: No change from baseline Vision Assessment?: No apparent visual deficits     Perception     Praxis      Pertinent Vitals/Pain Pain Assessment Pain Assessment: 0-10 Pain Score: 8  Pain Location: back and buttocks Pain Descriptors / Indicators: Sore, Moaning, Guarding Pain Intervention(s): Monitored during session, Limited activity within patient's tolerance, Patient requesting pain meds-RN notified     Hand Dominance Right   Extremity/Trunk Assessment Upper Extremity Assessment LUE Deficits / Details: unable to assess secondary to behavior/cognition   Lower Extremity Assessment Lower Extremity Assessment: Defer to PT evaluation   Cervical / Trunk  Assessment Cervical / Trunk Assessment: Normal   Communication Communication Communication: Expressive difficulties (slow slurred speech)   Cognition Arousal/Alertness: Lethargic Behavior During Therapy: Flat affect Overall Cognitive Status: Impaired/Different from baseline Area of Impairment: Attention, Following commands, Memory, Safety/judgement, Awareness, Problem solving                   Current Attention Level: Focused Memory: Decreased short-term memory Following Commands: Follows one step commands inconsistently, Follows one step commands with increased time Safety/Judgement: Decreased awareness of safety, Decreased awareness of deficits Awareness: Intellectual Problem Solving: Slow processing, Decreased initiation, Requires tactile cues, Requires verbal cues, Difficulty sequencing General Comments: pt with decreased awareness to saturated linens under him, he demonstrates decreased safety awareness and decreased awareness of increased need for assistance. Pt with significant instability with standing, did not communicate with staff and stated instability cause by "I got 4 people around me". Pt required cues for sequencing with progressing toward HOB. Pt easily agitated and communicated he was becoming frustrated during session, therefore limited cognition questions to assist with managing agitation     General Comments       Exercises     Shoulder Instructions      Home Living Family/patient expects to be discharged to:: Private residence Living Arrangements: Non-relatives/Friends Available Help at Discharge: Friend(s);Available 24 hours/day Type of Home: House Home Access: Stairs to enter [employer] of Steps: 3   Home Layout: One level                   Additional Comments: unsure reliability of information provided by pt      Prior Functioning/Environment Prior Level of Function : Independent/Modified Independent;Patient poor  historian/Family not available             Mobility Comments:  no AD ADLs Comments: indep, does not work, does not drive        OT Problem List: Decreased strength;Decreased range of motion;Decreased activity tolerance;Impaired balance (sitting and/or standing);Decreased coordination;Decreased cognition;Decreased safety awareness;Decreased knowledge of use of DME or AE;Decreased knowledge of precautions      OT Treatment/Interventions: Self-care/ADL training;Therapeutic exercise;Patient/family education;Balance training;Therapeutic activities;DME and/or AE instruction    OT Goals(Current goals can be found in the care plan section) Acute Rehab OT Goals Patient Stated Goal: to go home OT Goal Formulation: With patient Time For Goal Achievement: 01/20/22 Potential to Achieve Goals: Fair ADL Goals Pt Will Perform Grooming: with modified independence;standing Pt Will Transfer to Toilet: with modified independence;ambulating Additional ADL Goal #1: Pt will independently complete medication management task. Additional ADL Goal #2: Pt will follow 3 step directional task independently as a precursor to safe IADL.  OT Frequency: Min 2X/week    Co-evaluation PT/OT/SLP Co-Evaluation/Treatment: Yes Reason for Co-Treatment: Complexity of the patient's impairments (multi-system involvement);Necessary to address cognition/behavior during functional activity;For patient/therapist safety;To address functional/ADL transfers   OT goals addressed during session: ADL's and self-care      AM-PAC OT "6 Clicks" Daily Activity     Outcome Measure Help from another person eating meals?: A Little Help from another person taking care of personal grooming?: A Lot Help from another person toileting, which includes using toliet, bedpan, or urinal?: A Lot Help from another person bathing (including washing, rinsing, drying)?: A Lot Help from another person to put on and taking off regular upper body  clothing?: A Little Help from another person to put on and taking off regular lower body clothing?: A Lot 6 Click Score: 14   End of Session Equipment Utilized During Treatment: Rolling walker (2 wheels) Nurse Communication: Mobility status  Activity Tolerance: Patient tolerated treatment well Patient left: with call bell/phone within reach;in bed;with bed alarm set  OT Visit Diagnosis: Unsteadiness on feet (R26.81);Other abnormalities of gait and mobility (R26.89);Muscle weakness (generalized) (M62.81)                Time: 2025-4270 OT Time Calculation (min): 20 min Charges:  OT General Charges $OT Visit: 1 Visit OT Evaluation $OT Eval Moderate Complexity: 1 Mod  Babacar Haycraft OTR/L Acute Rehabilitation Services Office: [phone]   Rebeca Alert 01/06/2022, 1:15 PM

## 2022-01-07 DIAGNOSIS — E1010 Type 1 diabetes mellitus with ketoacidosis without coma: Secondary | ICD-10-CM | POA: Diagnosis not present

## 2022-01-07 DIAGNOSIS — E038 Other specified hypothyroidism: Secondary | ICD-10-CM

## 2022-01-07 DIAGNOSIS — F3189 Other bipolar disorder: Secondary | ICD-10-CM | POA: Diagnosis not present

## 2022-01-07 DIAGNOSIS — U071 COVID-19: Secondary | ICD-10-CM | POA: Diagnosis not present

## 2022-01-07 LAB — GLUCOSE, CAPILLARY
Glucose-Capillary: 282 mg/dL — ABNORMAL HIGH (ref 70–99)
Glucose-Capillary: 253 mg/dL — ABNORMAL HIGH (ref 70–99)
Glucose-Capillary: 63 mg/dL — ABNORMAL LOW (ref 70–99)
Glucose-Capillary: 81 mg/dL (ref 70–99)
Glucose-Capillary: 236 mg/dL — ABNORMAL HIGH (ref 70–99)
Glucose-Capillary: 304 mg/dL — ABNORMAL HIGH (ref 70–99)

## 2022-01-07 LAB — CBC
HCT: 33.5 % — ABNORMAL LOW (ref 39.0–52.0)
Hemoglobin: 11.6 g/dL — ABNORMAL LOW (ref 13.0–17.0)
MCH: 32 pg (ref 26.0–34.0)
MCHC: 34.6 g/dL (ref 30.0–36.0)
MCV: 92.3 fL (ref 80.0–100.0)
Platelets: 267 K/uL (ref 150–400)
RBC: 3.63 MIL/uL — ABNORMAL LOW (ref 4.22–5.81)
RDW: 12.1 % (ref 11.5–15.5)
WBC: 7.3 K/uL (ref 4.0–10.5)
nRBC: 0 % (ref 0.0–0.2)

## 2022-01-07 LAB — MAGNESIUM: Magnesium: 1.9 mg/dL (ref 1.7–2.4)

## 2022-01-07 LAB — BASIC METABOLIC PANEL WITH GFR
Anion gap: 7 (ref 5–15)
BUN: 13 mg/dL (ref 6–20)
CO2: 27 mmol/L (ref 22–32)
Calcium: 8.8 mg/dL — ABNORMAL LOW (ref 8.9–10.3)
Chloride: 105 mmol/L (ref 98–111)
Creatinine, Ser: 0.89 mg/dL (ref 0.61–1.24)
GFR, Estimated: >60 mL/min
Glucose, Bld: 163 mg/dL — ABNORMAL HIGH (ref 70–99)
Potassium: 3.6 mmol/L (ref 3.5–5.1)
Sodium: 139 mmol/L (ref 135–145)

## 2022-01-07 NOTE — Progress Notes (Addendum)
Patient reassigned to [facility] and report given. Nurses aide georgina spoke with pt and pt stated he felt startled by nurse but wasn't mad anymore with nurse.  Charge nurse aware of pt  reaction to nurse and pt reassigned. Charge nurse stated she had issues yesterday withpt also and felt pt does better with a male taking care of him . Unable to do assessment due to pt refusal and pt asked nurse to leave room  ?

## 2022-01-07 NOTE — Progress Notes (Signed)
?PROGRESS NOTE ? ? ? ?Mark JackMichael Q Schoneman  JOA:[id number] DOB: 07/22/1986 DOA: 01/04/2022 ?PCP: Rema FendtStephens, Amy J, NP ? ? ?Brief Narrative:  ?Mark [name] is a 36 y.o. male with medical history significant of seizures; hydrocephalus; and DM presenting with a fall.  He was last admitted fro 3/8-14 with AMS and seizures and left AMA.  He returned later that day to get checked out again; he was discharged with plan for outpatient neurology and psychiatry f/u.  He did not take his insulin after last d/c.  He returned today with frequent falls, generalized weakness, and pain.  He has a h/o erratic and aggressive behavior. ? ?ER Course:  Here with DKA, not taking insulin since he went home.  Also with SDH/SAH from frequent falls.  Neurosurgery consulted, unlikely to intervene. ?  ?Assessment & Plan: ? ?DKA (diabetic ketoacidosis) (HCC) ?-Uncontrolled diabetes with last A1c 8.0%.  Likely secondary to noncompliance. ?-Moderate DKA on admission based on pH 7.245, HCO3 11, anion gap 30 ?-Patient started on DKA protocol-insulin drip IV fluids, electrolyte replacement ?-Gap closed.  Beta hydroxybutyric acid: WNL ?-Currently on NovoLog 0 to 15 units 3 times daily, Semglee 30 units once daily and NovoLog 4 units 3 times daily. ?-Monitor blood sugar closely ?  ?ICH (intracerebral hemorrhage) (HCC) ?-2/2 Recurrent falls ?-Small SAH and minimal SDH noted on CT ?-EDP discussed with neurosurgery who recommends repeat CT in AM; he does not need surgical intervention at this time ?-Repeat CT on 3/17 shows small volume left posterior hemisphere subarachnoid hemorrhage appears stable aside from trace redistribution into the ventricles.  Bilateral subdural collection measuring 3 to 4 mm are stable from yesterday but new since March 8.  No skull fracture.   ?-Evaluated by Dr. Danielle DessElsner: Stable for the discharge from neurosurgery standpoint.  Follow-up outpatient  ?-PT/OT consulted-recommended SNF placement.  TOC consulted ? ?Other bipolar disorder  (HCC) ?-He has intermittent angry outbursts at baseline ?-As per plan last hospitalization, continue Depakote 500 mg PO BID, Lexapro and Seroquel ?-We will consult psychiatry ?  ?Hypothyroidism ?-Continue Synthroid ?-TSH is high-6.936.  Likely secondary to noncompliance with Synthyroid.  Continue current dose of Synthyroid and repeat TSH in 6 weeks outpatient with PCP ? ?COVID-19 virus infection ?-Positive on 3/8, last hospitalization ?-Likely incidental.  He is on room air and denies any respiratory symptoms ?-No isolation needed at this time ?  ?Lytic lesion of bone on x-ray ?-Found incidentally at T7 during last hospitalization ?-He was planned for MRI but was uncooperative and so MRI was deferred to PCP ?  ?Seizures (HCC) ?-recent negative EEG ?-He is supposed to taper off Keppra and stop Wellbutrin and be on Depakote 500 mg BID but there is no evidence of him taking Depakote at this time.  Valproic level: Less than 10 ?-Continue Keppra and Depakote-Taper off Keppra once Depakote level is therapeutic. ?  ?Tobacco dependence ?-Encourage cessation.   ?-Patch ordered ?  ?Polysubstance abuse (HCC) ?-Recommend cessation. ? ?Orthostatic hypotension: ?-Orthostatic vitals positive yesterday with PT ?-His blood pressure is stable today.  DC IV fluids ? ?Hypomagnesemia: Resolved ? ?DVT prophylaxis: SCD ?Code Status: Full code ?Family Communication:  None present at bedside.  Plan of care discussed with patient in length and he verbalized understanding and agreed with it. ? ?Disposition Plan: SNF ? ?Consultants:  ?Neurosurgery ? ?Procedures:  ?None ? ?Antimicrobials:  ?None ? ?Status is: Inpatient ? ? ? ?Subjective: ?Patient seen and examined.  Resting comfortably on the bed.  Does not want to  answer questions.  Tells me that he is tired.  No acute events overnight ? ?Objective: ?Vitals:  ? 01/07/22 0329 01/07/22 0330 01/07/22 0700 01/07/22 1215  ?BP: 120/75 120/75 102/69 114/69  ?Pulse:   82 84  ?Resp:   16 16  ?Temp:  97.6 ?F (36.4 ?C)  97.7 ?F (36.5 ?C) 97.6 ?F (36.4 ?C)  ?TempSrc: Oral  Oral Oral  ?SpO2: 97%  97% 99%  ?Weight:      ?Height:      ? ? ?Intake/Output Summary (Last 24 hours) at 01/07/2022 1410 ?Last data filed at 01/06/2022 2000 ?Gross per 24 hour  ?Intake --  ?Output 500 ml  ?Net -500 ml  ? ? ?Filed Weights  ? 01/04/22 1936  ?Weight: 95.7 kg  ? ? ?Examination: ? ?Patient refused exam ? ? ?Data Reviewed: I have personally reviewed following labs and imaging studies ? ?CBC: ?Recent Labs  ?Lab 01/01/22 ?0605 01/02/22 ?1854 01/04/22 ?9562 01/04/22 ?0900 01/06/22 ?0025 01/07/22 ?1308  ?WBC 8.1 9.6 15.9*  --  10.0 7.3  ?NEUTROABS  --  6.8 14.3*  --   --   --   ?HGB 13.5 15.0 13.2 13.9 10.4* 11.6*  ?HCT 38.7* 44.9 41.3 41.0 30.2* 33.5*  ?MCV 91.7 95.1 99.3  --  92.6 92.3  ?PLT 274 311 327  --  238 267  ? ? ?Basic Metabolic Panel: ?Recent Labs  ?Lab 01/01/22 ?0605 01/01/22 ?1033 01/04/22 ?1101 01/04/22 ?1730 01/05/22 ?1302 01/06/22 ?0025 01/07/22 ?6578  ?NA 139   < > 136 140 141 139 139  ?K 4.8   < > 4.1 4.1 3.6 3.7 3.6  ?CL 103   < > 104 106 108 103 105  ?CO2 27   < > 11* 23 26 26 27   ?GLUCOSE 264*   < > 645* 199* 114* 274* 163*  ?BUN 13   < > 31* 24* 12 12 13   ?CREATININE 1.02   < > 1.83* 1.20 1.03 1.10 0.89  ?CALCIUM 9.0   < > 8.7* 9.2 8.7* 8.9 8.8*  ?MG 1.8  --   --   --   --  1.6* 1.9  ?PHOS 3.6  --   --   --   --   --   --   ? < > = values in this interval not displayed.  ? ? ?GFR: ?Estimated Creatinine Clearance: 130.8 mL/min (by C-G formula based on SCr of 0.89 mg/dL). ?Liver Function Tests: ?Recent Labs  ?Lab 01/01/22 ?1033 01/02/22 ?1854 01/04/22 ?01/04/22  ?AST 17 23 36  ?ALT 16 17 28   ?ALKPHOS 58 80 83  ?BILITOT 0.5 0.4 1.7*  ?PROT 6.4* 8.0 7.7  ?ALBUMIN 3.7 4.5 4.6  ? ? ?No results for input(s): LIPASE, AMYLASE in the last 168 hours. ?No results for input(s): AMMONIA in the last 168 hours. ?Coagulation Profile: ?No results for input(s): INR, PROTIME in the last 168 hours. ?Cardiac Enzymes: ?No results for input(s):  CKTOTAL, CKMB, CKMBINDEX, TROPONINI in the last 168 hours. ?BNP (last 3 results) ?No results for input(s): PROBNP in the last 8760 hours. ?HbA1C: ?No results for input(s): HGBA1C in the last 72 hours. ? ?CBG: ?Recent Labs  ?Lab 01/06/22 ?2239 01/07/22 ?0254 01/07/22 ?2240 01/07/22 ?1215 01/07/22 ?1254  ?GLUCAP 398* 282* 253* 63* 81  ? ? ?Lipid Profile: ?No results for input(s): CHOL, HDL, LDLCALC, TRIG, CHOLHDL, LDLDIRECT in the last 72 hours. ?Thyroid Function Tests: ?No results for input(s): TSH, T4TOTAL, FREET4, T3FREE, THYROIDAB in the last 72 hours. ? ?  Anemia Panel: ?No results for input(s): VITAMINB12, FOLATE, FERRITIN, TIBC, IRON, RETICCTPCT in the last 72 hours. ?Sepsis Labs: ?No results for input(s): PROCALCITON, LATICACIDVEN in the last 168 hours. ? ?No results found for this or any previous visit (from the past 240 hour(s)). ?  ? ? ?Radiology Studies: ?No results found. ? ?Scheduled Meds: ? divalproex  500 mg Oral Q12H  ? escitalopram  20 mg Oral Daily  ? feeding supplement (GLUCERNA SHAKE)  237 mL Oral BID BM  ? folic acid  1 mg Oral Daily  ? insulin aspart  0-15 Units Subcutaneous TID WC  ? insulin aspart  0-5 Units Subcutaneous QHS  ? insulin aspart  4 Units Subcutaneous TID WC  ? insulin glargine-yfgn  30 Units Subcutaneous Q24H  ? levETIRAcetam  500 mg Oral BID  ? levothyroxine  50 mcg Oral Q0600  ? multivitamin with minerals  1 tablet Oral Daily  ? nicotine  14 mg Transdermal Daily  ? Ensure Max Protein  11 oz Oral QHS  ? QUEtiapine  400 mg Oral QHS  ? thiamine  100 mg Oral Daily  ? ?Continuous Infusions: ? ? ? ? LOS: 3 days  ? ?Time spent: 35 minutes ? ? ?Ollen Bowl, MD ?[facility] ? ?If 7PM-7AM, please contact night-coverage ?[email or web address] ?01/07/2022, 2:10 PM   ?

## 2022-01-07 NOTE — Progress Notes (Signed)
Pt yelling at nurse when approached stated he has one ear and to not sneak up on him, nurse did not sneak up on patient but just lightly touch him in low voice and pt jumped and turned to nurse as if he wanted to hit nurse. Stated for nurse to go away and wanted another nurse. Charge nurse notified. Pt awake watching tv and removed bp cuff agressively and threw on floor.  ?

## 2022-01-07 NOTE — Progress Notes (Signed)
Hypoglycemic Event ? ?CBG: 63 ? ?Treatment: patient received 8 oz of soda and ate some of his lunch tray ? ?Symptoms: None ? ?Follow-up CBG: Time:1253 CBG Result:81 ? ?Possible Reasons for Event: Unknown ? ?Comments/MD notified:MD aware  ? ? ? ?Mark [name], Mark [name] ? ? ?

## 2022-01-07 NOTE — TOC Initial Note (Signed)
Transition of Care (TOC) - Initial/Assessment Note  ? ? ?Patient Details  ?Name: Mark [name] ?MRN: [id number] ?Date of Birth: 1986/02/12 ? ?Transition of Care (TOC) CM/SW Contact:    ?Lawerance Sabal, RN ?Phone Number: ?01/07/2022, 1:35 PM ? ?Clinical Narrative:          ? 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  ?From home w roomates ?Tox screen positive for opiates and THC (amphetamines earlier this month) not consistent with home medication list. Admits to everyday marijuana use.  ? ?Patient left AMA on previous admission on 3/14.  ? ?Current recs for SNF. Payor source, drug use, and noted agitation on this admission will pose as barriers to placement.  ?Please optimize mobility with frequent ambulation when safe for staff and patient.  ? ? ?Primary Care- [facility] ?Medication Coverage- Medicaid, [facility] pharmacy        ? ? ?Expected Discharge Plan: Home/Self Care ?Barriers to Discharge: Inadequate or no insurance, Requiring sitter/restraints, Continued Medical Work up, Active Substance Use - Placement ? ? ?Patient Goals and CMS Choice ?  ?  ?  ? ?Expected Discharge Plan and Services ?Expected Discharge Plan: Home/Self Care ?In-house Referral: Clinical Social Work ?Discharge Planning Services: CM Consult ?  ?  ?                ?  ?  ?  ?  ?  ?  ?  ?  ?  ?  ? ?Prior Living Arrangements/Services ?  ?  ?  ?       ?  ?  ?  ?  ? ?Activities of Daily Living ?Home Assistive Devices/Equipment: Dan Humphreys (specify type) ?ADL Screening (condition at time of admission) ?Patient's cognitive ability adequate to safely complete daily activities?: Yes ?Is the patient deaf or have difficulty hearing?: No ?Does the patient have difficulty seeing, even when wearing glasses/contacts?: No ?Does the patient have difficulty concentrating, remembering, or making decisions?: Yes (pt can't  remember the medications he is taking at home) ?Patient able to express need for assistance with ADLs?: Yes ?Does the patient have difficulty dressing or bathing?: No ?Independently performs ADLs?: Yes (appropriate for developmental age) ?Communication: Appropriate for developmental age ?Dressing (OT): Needs assistance ?Grooming: Needs assistance ?Feeding: Appropriate for developmental age ?Bathing: Needs assistance ?Toileting: Needs assistance ?In/Out Bed: Needs assistance ?Walks in Home: Needs assistance ?Does the patient have difficulty walking or climbing stairs?: Yes ?Weakness of Legs: Both ?Weakness of Arms/Hands: Both ? ?Permission Sought/Granted ?  ?  ?   ?   ?   ?   ? ?Emotional Assessment ?  ?  ?  ?  ?  ?  ? ?Admission diagnosis:  Weakness [R53.1] ?DKA (diabetic ketoacidosis) (HCC) [E11.10] ?SAH (subarachnoid hemorrhage) (HCC) [I60.9] ?SDH (subdural hematoma) [S06.5XAA] ?Diabetic ketoacidosis without coma associated with type 1 diabetes mellitus (HCC) [E10.10] ?Patient Active Problem List  ? Diagnosis Date Noted  ? DKA (diabetic ketoacidosis) (HCC) 01/04/2022  ? Lytic lesion of bone on x-ray 01/04/2022  ? COVID-19 virus infection 01/04/2022  ? Other bipolar disorder (HCC)   ? Acute metabolic encephalopathy 12/27/2021  ? Agitation   ? Hypoglycemia 12/12/2021  ? Hypokalemia   ? Non-traumatic rhabdomyolysis   ? Hyperglycemia 03/21/2021  ? Hydrocephalus (HCC) 03/02/2021  ? Bipolar affective disorder, current episode mixed (HCC) 02/24/2021  ? Generalized anxiety disorder 02/24/2021  ? History of DVT of lower extremity  12/07/2020  ? Right leg DVT (HCC) 11/03/2020  ? Insomnia due to other mental disorder   ? Bipolar I disorder (HCC)   ? Uncontrolled type 1 diabetes mellitus with hyperglycemia (HCC)   ? Seizures (HCC)   ? Bipolar I disorder, current or most recent episode depressed, in partial remission (HCC)   ? MDD (major depressive disorder), recurrent episode, severe (HCC) 03/13/2020  ? Drug overdose,  intentional (HCC) 03/05/2020  ? Drug abuse (HCC) 03/05/2020  ? Attempted suicide (HCC) 03/05/2020  ? Insulin overdose   ? Hypothyroidism 02/02/2020  ? Diabetic polyneuropathy associated with type 1 diabetes mellitus (HCC) 02/02/2020  ? Homelessness 12/29/2019  ? Polysubstance abuse (HCC) 12/29/2019  ? Tobacco dependence 12/29/2019  ? ICH (intracerebral hemorrhage) (HCC) 12/29/2019  ? Suicidal ideation   ? Bipolar I disorder, most recent episode depressed, severe without psychotic features (HCC)   ? Adjustment disorder with depressed mood 12/07/2017  ? Dysthymia 10/08/2017  ? 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 Obstructive hydrocephalus (HCC) 09/19/2017  ? MDD (major depressive disorder) 08/22/2017  ? Severe recurrent major depression with psychotic features (HCC) 08/19/2017  ? Hyponatremia 08/18/2017  ? ?PCP:  Rema Fendt, NP ?Pharmacy:   ?[facility] ?[address], [address] ?[place] [address] ?Phone: [phone] Fax: [phone] ? ?[name] ?[address] ?[place] [address] ?Phone: [phone] Fax: [phone] ? ? ? ? ?Social Determinants of Health (SDOH) Interventions ?  ? ?Readmission Risk Interventions ?Readmission Risk Prevention Plan 03/25/2020  ?Transportation Screening Complete  ?Medication Review Oceanographer) Complete  ?PCP or Specialist appointment within 3-5 days of discharge Complete  ?HRI or Home Care Consult (No Data)  ?SW Recovery Care/Counseling Consult Complete  ?Palliative Care Screening Not Applicable  ?Skilled Nursing Facility Not Applicable  ?Some recent data might be hidden  ? ? ? ?

## 2022-01-08 DIAGNOSIS — E038 Other specified hypothyroidism: Secondary | ICD-10-CM | POA: Diagnosis not present

## 2022-01-08 DIAGNOSIS — S06360A Traumatic hemorrhage of cerebrum, unspecified, without loss of consciousness, initial encounter: Secondary | ICD-10-CM | POA: Diagnosis not present

## 2022-01-08 DIAGNOSIS — U071 COVID-19: Secondary | ICD-10-CM | POA: Diagnosis not present

## 2022-01-08 DIAGNOSIS — E1010 Type 1 diabetes mellitus with ketoacidosis without coma: Secondary | ICD-10-CM | POA: Diagnosis not present

## 2022-01-08 LAB — VALPROIC ACID LEVEL: Valproic Acid Lvl: 37 ug/mL — ABNORMAL LOW (ref 50.0–100.0)

## 2022-01-08 LAB — BASIC METABOLIC PANEL
Anion gap: 10 (ref 5–15)
BUN: 16 mg/dL (ref 6–20)
CO2: 29 mmol/L (ref 22–32)
Calcium: 9.2 mg/dL (ref 8.9–10.3)
Chloride: 100 mmol/L (ref 98–111)
Creatinine, Ser: 1.02 mg/dL (ref 0.61–1.24)
GFR, Estimated: >60 mL/min (ref >60)
Glucose, Bld: 315 mg/dL — ABNORMAL HIGH (ref 70–99)
Potassium: 4.3 mmol/L (ref 3.5–5.1)
Sodium: 139 mmol/L (ref 135–145)

## 2022-01-08 LAB — GLUCOSE, CAPILLARY
Glucose-Capillary: 283 mg/dL — ABNORMAL HIGH (ref 70–99)
Glucose-Capillary: 117 mg/dL — ABNORMAL HIGH (ref 70–99)
Glucose-Capillary: 195 mg/dL — ABNORMAL HIGH (ref 70–99)
Glucose-Capillary: 345 mg/dL — ABNORMAL HIGH (ref 70–99)

## 2022-01-08 LAB — PHOSPHORUS: Phosphorus: 3.4 mg/dL (ref 2.5–4.6)

## 2022-01-08 MED ORDER — ESCITALOPRAM OXALATE 10 MG PO TABS: 5.0000 mg | ORAL_TABLET | Freq: Every day | ORAL | Status: DC

## 2022-01-08 MED ORDER — ESCITALOPRAM OXALATE 20 MG PO TABS
20.0000 mg | ORAL_TABLET | Freq: Every day | ORAL | Status: DC
  Administered 2022-01-09 – 2022-01-11 (×3): 20 mg via ORAL
  Filled 2022-01-08 (×3): qty 1

## 2022-01-08 MED ORDER — DIVALPROEX SODIUM 250 MG PO DR TAB
1000.0000 mg | DELAYED_RELEASE_TABLET | Freq: Every day | ORAL | Status: DC
  Administered 2022-01-08 – 2022-01-10 (×3): 1000 mg via ORAL
  Filled 2022-01-08 (×3): qty 4

## 2022-01-08 MED ORDER — DIVALPROEX SODIUM 250 MG PO DR TAB
500.0000 mg | DELAYED_RELEASE_TABLET | Freq: Every morning | ORAL | Status: DC
  Administered 2022-01-09 – 2022-01-11 (×3): 500 mg via ORAL
  Filled 2022-01-08 (×3): qty 2

## 2022-01-08 MED ORDER — INSULIN ASPART 100 UNIT/ML IJ SOLN
8.0000 [IU] | Freq: Three times a day (TID) | INTRAMUSCULAR | Status: DC
  Administered 2022-01-08 – 2022-01-09 (×4): 8 [IU] via SUBCUTANEOUS

## 2022-01-08 MED ORDER — INSULIN GLARGINE-YFGN 100 UNIT/ML ~~LOC~~ SOLN
40.0000 [IU] | SUBCUTANEOUS | Status: DC
Start: 2022-01-08 — End: 2022-01-09
  Administered 2022-01-08: 40 [IU] via SUBCUTANEOUS
  Filled 2022-01-08 (×2): qty 0.4

## 2022-01-08 NOTE — Plan of Care (Signed)
  Problem: Education: Goal: Knowledge of General Education information will improve Description: Including pain rating scale, medication(s)/side effects and non-pharmacologic comfort measures Outcome: Progressing   Problem: Health Behavior/Discharge Planning: Goal: Ability to manage health-related needs will improve Outcome: Progressing   Problem: Clinical Measurements: Goal: Will remain free from infection Outcome: Progressing   

## 2022-01-08 NOTE — Progress Notes (Signed)
PROGRESS NOTE        PATIENT DETAILS Name: Mark [name] Age: 36 y.o. Sex: male Date of Birth: [birth year] Admit Date: 01/04/2022 Admitting Physician Jonah Blue, MD WUJ:[id number], Amy J, NP  Brief Summary: Patient is a 36 y.o.  male with a history of seizures, hydrocephalus, DM-1-we will adjust left the hospital AMA on 3/14-presented with frequent falls/weakness/pain-he was found to have diabetic ketoacidosis along with traumatic small SAH/SDH on neuroimaging studies.  Patient was subsequently admitted to the hospitalist service.  See below for further details.  Significant events: 3/8-3/14>> hospitalization for acute metabolic encephalopathy-seizure activity-left AMA. 3/16>> presented to the ED with fall/weakness-found to have DKA along with small SDH/SAH.  Significant studies: 3/16>> CXR: No PNA 3/16>> CT head: Small volume sulcal SAH in the left parietal lobe, new trace bilateral subdural hygromas with minimal superimposed acute SDH. 3/17>> CT head: Small volume SAH appears stable, bilateral SDH collections appear stable.  Stable chronic lateral and third ventriculomegaly.  Significant microbiology data:   Procedures: None  Consults: Neurosurgery  Subjective: No chest pain-shortness of breath-lying comfortably in bed-complains that he had a bad night and could not sleep.  Does not want to be disturbed as much as possible.  He was agreeable for a quick physical exam.  Objective: Vitals: Blood pressure 124/85, pulse 82, temperature 97.9 F (36.6 C), temperature source Oral, resp. rate 16, height 6\' 2"  (1.88 m), weight 95.7 kg, SpO2 98 %.   Exam: Gen Exam:Alert awake-not in any distress HEENT:atraumatic, normocephalic Chest: B/L clear to auscultation anteriorly CVS:S1S2 regular Abdomen:soft non tender, non distended Extremities:no edema Neurology: Seems to be moving all 4 extremities. Skin: no rash  Pertinent Labs/Radiology: CBC Latest  Ref Rng & Units 01/07/2022 01/06/2022 01/04/2022  WBC 4.0 - 10.5 K/uL 7.3 10.0 -  Hemoglobin 13.0 - 17.0 g/dL 11.6(L) 10.4(L) 13.9  Hematocrit 39.0 - 52.0 % 33.5(L) 30.2(L) 41.0  Platelets 150 - 400 K/uL 267 238 -    Lab Results  Component Value Date   NA 139 01/08/2022   K 4.3 01/08/2022   CL 100 01/08/2022   CO2 29 01/08/2022      Assessment/Plan: DKA: Resolved with IVF-IV insulin.  DM-1 (A1c 8.0 on 3/16) with uncontrolled hyperglycemia: CBGs remain on the higher side-increase Semglee to 40 units, increase Premeal NovoLog to 8 units-continue SSI and follow.  Recent Labs    01/07/22 1615 01/07/22 2036 01/08/22 0737  GLUCAP 236* 304* 283*    Orthostatic hypotension: Likely a sequelae of DM/autonomic neuropathy: Volume status appears stable.  Repeat orthostatics today-if still positive-we can consider low-dose midodrine.  Doubt patient will be compliant with compression stockings.  Small traumatic SAH/SDH: Evaluated by neurosurgery-outpatient follow-up recommended.  No concerning findings on exam.  Lytic lesion lesion seen on T7 vertebral body on CT C-spine on 2/21: Per prior documentation by Dr. Henrene Dodge was planned but patient was uncooperative and was not able to be done.  Since this is an incidental finding-MRI has been deferred to outpatient setting/PCP  Seizure disorder: Continue Keppra-once Depakote levels are therapeutic-plan is to discontinue Keppra (see prior documentation by Dr. Jacqulyn Bath)  Bipolar disorder: Per prior documentation-has had angry outbursts during this hospital stay (apparently this is his baseline)-remains on Depakote, Lexapro and Seroquel.  Hypothyroidism: Continue Synthroid -TSH minimally elevated at 6.9-stable for further dosage adjustment by PCP in the outpatient setting.  COVID-19  infection (positive on 3/8): Asymptomatic-does not need isolation.  Polysubstance abuse (multiple UDS positive for amphetamines, benzos, opiates, cannabinoids):  Counseled  Tobacco abuse: Counseled  BMI: Estimated body mass index is 27.09 kg/m as calculated from the following:   Height as of this encounter: 6\' 2"  (1.88 m).   Weight as of this encounter: 95.7 kg.   Code status:   Code Status: Full Code   DVT Prophylaxis: SCDs Start: 01/04/22 1125    Family Communication: None at bedside   Disposition Plan: Status is: Inpatient Remains inpatient appropriate because: Unsteady gait/orthostatic hypotension-recommendations from therapy for SNF.   Planned Discharge Destination:Skilled nursing facility   Diet: Diet Order             Diet Carb Modified Fluid consistency: Thin; Room service appropriate? No  Diet effective now                     Antimicrobial agents: Anti-infectives (From admission, onward)    None        MEDICATIONS: Scheduled Meds:  divalproex  500 mg Oral Q12H   escitalopram  20 mg Oral Daily   feeding supplement (GLUCERNA SHAKE)  237 mL Oral BID BM   folic acid  1 mg Oral Daily   insulin aspart  0-15 Units Subcutaneous TID WC   insulin aspart  0-5 Units Subcutaneous QHS   insulin aspart  4 Units Subcutaneous TID WC   insulin glargine-yfgn  30 Units Subcutaneous Q24H   levETIRAcetam  500 mg Oral BID   levothyroxine  50 mcg Oral Q0600   multivitamin with minerals  1 tablet Oral Daily   nicotine  14 mg Transdermal Daily   Ensure Max Protein  11 oz Oral QHS   QUEtiapine  400 mg Oral QHS   thiamine  100 mg Oral Daily   Continuous Infusions: PRN Meds:.acetaminophen, haloperidol lactate, ondansetron (ZOFRAN) IV   I have personally reviewed following labs and imaging studies  LABORATORY DATA: CBC: Recent Labs  Lab 01/02/22 1854 01/04/22 0843 01/04/22 0900 01/06/22 0025 01/07/22 0713  WBC 9.6 15.9*  --  10.0 7.3  NEUTROABS 6.8 14.3*  --   --   --   HGB 15.0 13.2 13.9 10.4* 11.6*  HCT 44.9 41.3 41.0 30.2* 33.5*  MCV 95.1 99.3  --  92.6 92.3  PLT 311 327  --  238 267    Basic Metabolic  Panel: Recent Labs  Lab 01/04/22 1730 01/05/22 1302 01/06/22 0025 01/07/22 0713 01/08/22 0728  NA 140 141 139 139 139  K 4.1 3.6 3.7 3.6 4.3  CL 106 108 103 105 100  CO2 23 26 26 27 29   GLUCOSE 199* 114* 274* 163* 315*  BUN 24* 12 12 13 16   CREATININE 1.20 1.03 1.10 0.89 1.02  CALCIUM 9.2 8.7* 8.9 8.8* 9.2  MG  --   --  1.6* 1.9  --   PHOS  --   --   --   --  3.4    GFR: Estimated Creatinine Clearance: 114.2 mL/min (by C-G formula based on SCr of 1.02 mg/dL).  Liver Function Tests: Recent Labs  Lab 01/01/22 1033 01/02/22 1854 01/04/22 0843  AST 17 23 36  ALT 16 17 28   ALKPHOS 58 80 83  BILITOT 0.5 0.4 1.7*  PROT 6.4* 8.0 7.7  ALBUMIN 3.7 4.5 4.6   No results for input(s): LIPASE, AMYLASE in the last 168 hours. No results for input(s): AMMONIA in the last 168 hours.  Coagulation Profile: No results for input(s): INR, PROTIME in the last 168 hours.  Cardiac Enzymes: No results for input(s): CKTOTAL, CKMB, CKMBINDEX, TROPONINI in the last 168 hours.  BNP (last 3 results) No results for input(s): PROBNP in the last 8760 hours.  Lipid Profile: No results for input(s): CHOL, HDL, LDLCALC, TRIG, CHOLHDL, LDLDIRECT in the last 72 hours.  Thyroid Function Tests: No results for input(s): TSH, T4TOTAL, FREET4, T3FREE, THYROIDAB in the last 72 hours.  Anemia Panel: No results for input(s): VITAMINB12, FOLATE, FERRITIN, TIBC, IRON, RETICCTPCT in the last 72 hours.  Urine analysis:    Component Value Date/Time   COLORURINE STRAW (A) 01/04/2022 0830   APPEARANCEUR CLEAR 01/04/2022 0830   APPEARANCEUR Clear 01/20/2015 2136   LABSPEC 1.021 01/04/2022 0830   LABSPEC 1.023 01/20/2015 2136   PHURINE 5.0 01/04/2022 0830   GLUCOSEU >=500 (A) 01/04/2022 0830   GLUCOSEU >=500 01/20/2015 2136   HGBUR SMALL (A) 01/04/2022 0830   BILIRUBINUR NEGATIVE 01/04/2022 0830   BILIRUBINUR Negative 01/20/2015 2136   KETONESUR 80 (A) 01/04/2022 0830   PROTEINUR NEGATIVE 01/04/2022  0830   NITRITE NEGATIVE 01/04/2022 0830   LEUKOCYTESUR NEGATIVE 01/04/2022 0830   LEUKOCYTESUR Negative 01/20/2015 2136    Sepsis Labs: Lactic Acid, Venous    Component Value Date/Time   LATICACIDVEN 1.2 12/28/2021 1007    MICROBIOLOGY: No results found for this or any previous visit (from the past 240 hour(s)).  RADIOLOGY STUDIES/RESULTS: No results found.   LOS: 4 days   Jeoffrey Massed, MD  [facility]    To contact the attending provider between 7A-7P or the covering provider during after hours 7P-7A, please log into the web site [email or web address] and access using universal [place] password for that web site. If you do not have the password, please call the hospital operator.  01/08/2022, 10:06 AM

## 2022-01-08 NOTE — Plan of Care (Signed)
?  Problem: Clinical Measurements: ?Goal: Will remain free from infection ?Outcome: Progressing ?Goal: Respiratory complications will improve ?Outcome: Progressing ?Goal: Cardiovascular complication will be avoided ?Outcome: Progressing ?  ?Problem: Nutrition: ?Goal: Adequate nutrition will be maintained ?Outcome: Progressing ?  ?Problem: Coping: ?Goal: Level of anxiety will decrease ?Outcome: Progressing ?  ?Problem: Elimination: ?Goal: Will not experience complications related to bowel motility ?Outcome: Progressing ?Goal: Will not experience complications related to urinary retention ?Outcome: Progressing ?  ?Problem: Pain Managment: ?Goal: General experience of comfort will improve ?Outcome: Progressing ?  ?Problem: Safety: ?Goal: Ability to remain free from injury will improve ?Outcome: Progressing ?  ?Problem: Fluid Volume: ?Goal: Ability to maintain a balanced intake and output will improve ?Outcome: Progressing ?  ?Problem: Education: ?Goal: Knowledge of General Education information will improve ?Description: Including pain rating scale, medication(s)/side effects and non-pharmacologic comfort measures ?Outcome: Not Progressing ?  ?Problem: Health Behavior/Discharge Planning: ?Goal: Ability to manage health-related needs will improve ?Outcome: Not Progressing ?  ?Problem: Clinical Measurements: ?Goal: Ability to maintain clinical measurements within normal limits will improve ?Outcome: Not Progressing ?Goal: Diagnostic test results will improve ?Outcome: Not Progressing ?  ?Problem: Activity: ?Goal: Risk for activity intolerance will decrease ?Outcome: Not Progressing ?  ?Problem: Skin Integrity: ?Goal: Risk for impaired skin integrity will decrease ?Outcome: Not Progressing ?  ?Problem: Education: ?Goal: Ability to describe self-care measures that may prevent or decrease complications (Diabetes Survival Skills Education) will improve ?Outcome: Not Progressing ?  ?Problem: Coping: ?Goal: Ability to adjust to  condition or change in health will improve ?Outcome: Not Progressing ?  ?Problem: Metabolic: ?Goal: Ability to maintain appropriate glucose levels will improve ?Outcome: Not Progressing ?  ?

## 2022-01-08 NOTE — Progress Notes (Signed)
Physical Therapy Treatment ?Patient Details ?Name: Mark [name] ?MRN: [id number] ?DOB: 29-Feb-1986 ?Today's Date: 01/08/2022 ? ? ?History of Present Illness 36 y.o. male presenting 01/04/22 with a fall. 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 Neurosurgery consult with no interventions indicated.  He was last admitted fro 3/8-14 with AMS and seizures and left AMA.  PMH significant of seizures; hydrocephalus; DM; polyneuropathy, bipolar disorder ? ?  ?PT Comments  ? ? Patient much more cooperative (and even appreciative). Patient requires 2 person assist with ambulation even with RW due to ataxia.    ?Recommendations for follow up therapy are one component of a multi-disciplinary discharge planning process, led by the attending physician.  Recommendations may be updated based on patient status, additional functional criteria and insurance authorization. ? ?Follow Up Recommendations ? Skilled nursing-short term rehab (<3 hours/day) ?  ?  ?Assistance Recommended at Discharge Frequent or constant Supervision/Assistance  ?Patient can return home with the following Assistance with cooking/housework;Direct supervision/assist for medications management;Direct supervision/assist for financial management;Assist for transportation;Help with stairs or ramp for entrance;Two people to help with walking and/or transfers;Two people to help with bathing/dressing/bathroom ?  ?Equipment Recommendations ? Rolling walker (2 wheels)  ?  ?Recommendations for Other Services   ? ? ?  ?Precautions / Restrictions Precautions ?Precautions: Fall ?Precaution Comments: seizure ?Restrictions ?Weight Bearing Restrictions: No  ?  ? ?Mobility ? Bed Mobility ?Overal bed mobility: Needs Assistance ?Bed Mobility: Supine to Sit, Sit to Supine ?  ?  ?Supine to sit: Min guard ?Sit to supine: Min assist ?  ?General bed mobility comments: verbap cues and patient used rail to assist ?  ? ?Transfers ?Overall  transfer level: Needs assistance ?Equipment used: 2 person hand held assist, Rolling walker (2 wheels) ?Transfers: Sit to/from Stand ?Sit to Stand: Min assist, +2 physical assistance, +2 safety/equipment ?  ?  ?  ?  ?  ?General transfer comment: unsteady with mobility and transfers with 2 person hand held or with RW ?  ? ?Ambulation/Gait ?Ambulation/Gait assistance: Mod assist, +2 physical assistance ?Gait Distance (Feet): 30 Feet (seated rest, 30) ?Assistive device: 2 person hand held assist, Rolling walker (2 wheels) ?Gait Pattern/deviations: Step-through pattern, Ataxic, Narrow base of support ?  ?  ?  ?General Gait Details: pt with strides too long for his ability/stability; difficulty placing feet appropriately with ataxic quality to his gait; more stable with RW, however erratic when turning ? ? ?Stairs ?  ?  ?  ?  ?  ? ? ?Wheelchair Mobility ?  ? ?Modified Rankin (Stroke Patients Only) ?  ? ? ?  ?Balance Overall balance assessment: Needs assistance ?Sitting-balance support: Feet supported ?Sitting balance-Leahy Scale: Fair ?Sitting balance - Comments: able to maintain sitting balance on EOB ?Postural control: Posterior lean ?Standing balance support: During functional activity, Bilateral upper extremity supported ?Standing balance-Leahy Scale: Poor ?Standing balance comment: reliant on exeternal support ?  ?  ?  ?  ?  ?  ?  ?  ?  ?  ?  ?  ? ?  ?Cognition Arousal/Alertness: Awake/alert ?Behavior During Therapy: Flat affect ?Overall Cognitive Status: Impaired/Different from baseline ?Area of Impairment: Safety/judgement, Problem solving ?  ?  ?  ?  ?  ?  ?  ?  ?  ?Current Attention Level: Focused ?Memory: Decreased short-term memory ?Following Commands: Follows one step commands inconsistently, Follows one step commands with increased time ?Safety/Judgement: Decreased awareness of safety, Decreased awareness of deficits ?Awareness:  Intellectual ?Problem Solving: Slow processing, Decreased initiation, Requires  tactile cues, Requires verbal cues, Difficulty sequencing ?General Comments: alert this treatment session but requires verbal cues for safety ?  ?  ? ?  ?Exercises General Exercises - Lower Extremity ?Long Arc Quad: AROM, Both, 10 reps ?Hip Flexion/Marching: AROM, Both, 5 reps ?Heel Raises: AROM, 10 reps, Both ? ?  ?General Comments   ?  ?  ? ?Pertinent Vitals/Pain Pain Assessment ?Pain Assessment: Faces ?Faces Pain Scale: Hurts even more ?Pain Location: back and buttocks ?Pain Descriptors / Indicators: Sore, Grimacing ?Pain Intervention(s): Limited activity within patient's tolerance, Monitored during session  ? ? ?Home Living   ?  ?  ?  ?  ?  ?  ?  ?  ?  ?   ?  ?Prior Function    ?  ?  ?   ? ?PT Goals (current goals can now be found in the care plan section) Acute Rehab PT Goals ?Patient Stated Goal: wants to return home ?Time For Goal Achievement: 01/20/22 ?Potential to Achieve Goals: Fair ?Progress towards PT goals: Progressing toward goals ? ?  ?Frequency ? ? ? Min 3X/week ? ? ? ?  ?PT Plan Current plan remains appropriate  ? ? ?Co-evaluation PT/OT/SLP Co-Evaluation/Treatment: Yes ?Reason for Co-Treatment: Necessary to address cognition/behavior during functional activity;For patient/therapist safety;To address functional/ADL transfers ?PT goals addressed during session: Mobility/safety with mobility;Balance;Proper use of DME ?OT goals addressed during session: Strengthening/ROM ?  ? ?  ?AM-PAC PT "6 Clicks" Mobility   ?Outcome Measure ? Help needed turning from your back to your side while in a flat bed without using bedrails?: A Little ?Help needed moving from lying on your back to sitting on the side of a flat bed without using bedrails?: A Little ?Help needed moving to and from a bed to a chair (including a wheelchair)?: Total ?Help needed standing up from a chair using your arms (e.g., wheelchair or bedside chair)?: Total ?Help needed to walk in hospital room?: Total ?Help needed climbing 3-5 steps with a  railing? : Total ?6 Click Score: 10 ? ?  ?End of Session Equipment Utilized During Treatment: Gait belt ?Activity Tolerance: Patient tolerated treatment well ?Patient left: with call bell/phone within reach;in bed;with bed alarm set ?Nurse Communication: Mobility status ?PT Visit Diagnosis: Unsteadiness on feet (R26.81);Muscle weakness (generalized) (M62.81);Difficulty in walking, not elsewhere classified (R26.2) ?  ? ? ?Time: 5809-9833 ?PT Time Calculation (min) (ACUTE ONLY): 26 min ? ?Charges:  $Gait Training: 8-22 mins          ?          ? ? ?Jerolyn Center, PT ?Acute Rehabilitation Services  ?Pager [phone] ?Office [phone] ? ? ? ?Scherrie November Arlyce Circle ?01/08/2022, 4:04 PM ? ?
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
## 2022-01-08 NOTE — Progress Notes (Signed)
Occupational Therapy Treatment ?Patient Details ?Name: Mark [name] ?MRN: [id number] ?DOB: Feb 17, 1986 ?Today's Date: 01/08/2022 ? ? ?History of present illness 36 y.o. male presenting 01/04/22 with a fall. 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 Neurosurgery consult with no interventions indicated.  He was last admitted fro 3/8-14 with AMS and seizures and left AMA.  PMH significant of seizures; hydrocephalus; DM; polyneuropathy, bipolar disorder ?  ?OT comments ? Patient received in supine and agreeable to PT/OT session.  Patient was able to get to EOB and complained of dizziness with BP being within normal range.  Patient performed mobility in room with +2 hand held assist with unsteady gait and balance.  2nd attempt performed with RW with increased stability but continues to demonstrate unsafe balance. Patient performed seated activities to address sitting balance.  Acute OT to continue to follow.   ? ?Recommendations for follow up therapy are one component of a multi-disciplinary discharge planning process, led by the attending physician.  Recommendations may be updated based on patient status, additional functional criteria and insurance authorization. ?   ?Follow Up Recommendations ? Skilled nursing-short term rehab (<3 hours/day)  ?  ?Assistance Recommended at Discharge Frequent or constant Supervision/Assistance  ?Patient can return home with the following ? Assistance with cooking/housework;Direct supervision/assist for medications management;Assist for transportation;Help with stairs or ramp for entrance;A lot of help with walking and/or transfers;Two people to help with walking and/or transfers;A lot of help with bathing/dressing/bathroom ?  ?Equipment Recommendations ? BSC/3in1  ?  ?Recommendations for Other Services   ? ?  ?Precautions / Restrictions Precautions ?Precautions: Fall ?Precaution Comments: seizure ?Restrictions ?Weight Bearing Restrictions: No   ? ? ?  ? ?Mobility Bed Mobility ?Overal bed mobility: Needs Assistance ?Bed Mobility: Supine to Sit, Sit to Supine ?  ?  ?Supine to sit: Min guard ?Sit to supine: Min assist ?  ?General bed mobility comments: verbap cues and patient used rail to assist ?  ? ?Transfers ?Overall transfer level: Needs assistance ?Equipment used: 2 person hand held assist, Rolling walker (2 wheels) ?Transfers: Sit to/from Stand ?Sit to Stand: Min assist, +2 physical assistance, +2 safety/equipment ?  ?  ?Step pivot transfers: +2 physical assistance, +2 safety/equipment, Mod assist ?  ?  ?General transfer comment: unsteady with mobility and transfers with 2 person hand held or with RW ?  ?  ?Balance Overall balance assessment: Needs assistance ?Sitting-balance support: Feet supported ?Sitting balance-Leahy Scale: Fair ?Sitting balance - Comments: able to maintain sitting balance on EOB ?  ?Standing balance support: During functional activity, Bilateral upper extremity supported ?Standing balance-Leahy Scale: Poor ?Standing balance comment: reliant on exeternal support ?  ?  ?  ?  ?  ?  ?  ?  ?  ?  ?  ?   ? ?ADL either performed or assessed with clinical judgement  ? ?ADL   ?  ?  ?  ?  ?  ?  ?  ?  ?  ?  ?  ?  ?  ?  ?  ?  ?  ?  ?  ?  ?  ? ?Extremity/Trunk Assessment Upper Extremity Assessment ?RUE Deficits / Details: grossly weak 4/5, poor coordination ?RUE Sensation: WNL ?RUE Coordination: decreased fine motor ?LUE Deficits / Details: unable to assess secondary to behavior/cognition ?LUE Sensation: WNL ?LUE Coordination: decreased fine motor ?  ?  ?  ?  ?  ? ?Vision   ?  ?  ?  Perception   ?  ?Praxis   ?  ? ?Cognition Arousal/Alertness: Awake/alert ?Behavior During Therapy: Flat affect ?Overall Cognitive Status: Impaired/Different from baseline ?Area of Impairment: Safety/judgement, Problem solving ?  ?  ?  ?  ?  ?  ?  ?  ?  ?Current Attention Level: Focused ?Memory: Decreased short-term memory ?Following Commands: Follows one step  commands inconsistently, Follows one step commands with increased time ?Safety/Judgement: Decreased awareness of safety, Decreased awareness of deficits ?Awareness: Intellectual ?Problem Solving: Slow processing, Decreased initiation, Requires tactile cues, Requires verbal cues, Difficulty sequencing ?General Comments: alert this treatment session but requires verbal cues for safety ?  ?  ?   ?Exercises   ? ?  ?Shoulder Instructions   ? ? ?  ?General Comments    ? ? ?Pertinent Vitals/ Pain       Pain Assessment ?Pain Assessment: Faces ?Faces Pain Scale: Hurts even more ?Pain Location: back and buttocks ?Pain Descriptors / Indicators: Sore, Grimacing ?Pain Intervention(s): Limited activity within patient's tolerance, Monitored during session, Repositioned ? ?Home Living   ?  ?  ?  ?  ?  ?  ?  ?  ?  ?  ?  ?  ?  ?  ?  ?  ?  ?  ? ?  ?Prior Functioning/Environment    ?  ?  ?  ?   ? ?Frequency ? Min 2X/week  ? ? ? ? ?  ?Progress Toward Goals ? ?OT Goals(current goals can now be found in the care plan section) ? Progress towards OT goals: Progressing toward goals ? ?Acute Rehab OT Goals ?Patient Stated Goal: go home ?OT Goal Formulation: With patient ?Time For Goal Achievement: 01/20/22 ?Potential to Achieve Goals: Fair ?ADL Goals ?Pt Will Perform Grooming: with modified independence;standing ?Pt Will Perform Lower Body Dressing: with modified independence;sit to/from stand ?Pt Will Transfer to Toilet: with modified independence;ambulating ?Additional ADL Goal #1: Pt will independently complete medication management task. ?Additional ADL Goal #2: Pt will follow 3 step directional task independently as a precursor to safe IADL.  ?Plan Discharge plan remains appropriate   ? ?Co-evaluation ? ? ? PT/OT/SLP Co-Evaluation/Treatment: Yes ?Reason for Co-Treatment: Necessary to address cognition/behavior during functional activity;For patient/therapist safety;To address functional/ADL transfers ?  ?OT goals addressed during  session: Strengthening/ROM ?  ? ?  ?AM-PAC OT "6 Clicks" Daily Activity     ?Outcome Measure ? ? Help from another person eating meals?: A Little ?Help from another person taking care of personal grooming?: A Little ?Help from another person toileting, which includes using toliet, bedpan, or urinal?: A Lot ?Help from another person bathing (including washing, rinsing, drying)?: A Lot ?Help from another person to put on and taking off regular upper body clothing?: A Little ?Help from another person to put on and taking off regular lower body clothing?: A Lot ?6 Click Score: 15 ? ?  ?End of Session Equipment Utilized During Treatment: Gait belt;Rolling walker (2 wheels) ? ?OT Visit Diagnosis: Unsteadiness on feet (R26.81);Other abnormalities of gait and mobility (R26.89);Muscle weakness (generalized) (M62.81) ?  ?Activity Tolerance Patient tolerated treatment well ?  ?Patient Left in bed;with call bell/phone within reach;with bed alarm set ?  ?Nurse Communication Mobility status ?  ? ?   ? ?Time: 1638-4665 ?OT Time Calculation (min): 25 min ? ?Charges: OT General Charges ?$OT Visit: 1 Visit ?OT Treatments ?$Therapeutic Activity: 8-22 mins ? ?Alfonse Flavors, OTA ?Acute Rehabilitation Services  ?Pager [phone] ?Office [phone] ? ? ?Ryshawn Sanzone Jeannett Senior ?  01/08/2022, 2:15 PM ?

## 2022-01-09 ENCOUNTER — Other Ambulatory Visit (HOSPITAL_COMMUNITY): Payer: Self-pay

## 2022-01-09 DIAGNOSIS — E038 Other specified hypothyroidism: Secondary | ICD-10-CM | POA: Diagnosis not present

## 2022-01-09 DIAGNOSIS — F1910 Other psychoactive substance abuse, uncomplicated: Secondary | ICD-10-CM | POA: Diagnosis not present

## 2022-01-09 DIAGNOSIS — U071 COVID-19: Secondary | ICD-10-CM | POA: Diagnosis not present

## 2022-01-09 DIAGNOSIS — E1010 Type 1 diabetes mellitus with ketoacidosis without coma: Secondary | ICD-10-CM | POA: Diagnosis not present

## 2022-01-09 LAB — GLUCOSE, CAPILLARY
Glucose-Capillary: 277 mg/dL — ABNORMAL HIGH (ref 70–99)
Glucose-Capillary: 277 mg/dL — ABNORMAL HIGH (ref 70–99)
Glucose-Capillary: 228 mg/dL — ABNORMAL HIGH (ref 70–99)
Glucose-Capillary: 91 mg/dL (ref 70–99)

## 2022-01-09 MED ORDER — INSULIN ASPART 100 UNIT/ML IJ SOLN
10.0000 [IU] | Freq: Three times a day (TID) | INTRAMUSCULAR | Status: DC
  Administered 2022-01-09 – 2022-01-11 (×5): 10 [IU] via SUBCUTANEOUS

## 2022-01-09 MED ORDER — OXYCODONE HCL 5 MG PO TABS
5.0000 mg | ORAL_TABLET | Freq: Four times a day (QID) | ORAL | Status: AC | PRN
  Administered 2022-01-09 – 2022-01-10 (×4): 5 mg via ORAL
  Filled 2022-01-09 (×4): qty 1

## 2022-01-09 MED ORDER — INSULIN GLARGINE-YFGN 100 UNIT/ML ~~LOC~~ SOLN
46.0000 [IU] | Freq: Every day | SUBCUTANEOUS | Status: DC
  Administered 2022-01-09 – 2022-01-10 (×2): 46 [IU] via SUBCUTANEOUS
  Filled 2022-01-09 (×4): qty 0.46

## 2022-01-09 NOTE — Progress Notes (Signed)
?      ?                 PROGRESS NOTE ? ?      ?PATIENT DETAILS ?Name: Mark [name] ?Age: 36 y.o. ?Sex: male ?Date of Birth: [birth year] ?Admit Date: 01/04/2022 ?Admitting Physician Jonah Blue, MD ?GQQ:[id number], Salomon Fick, NP ? ?Brief Summary: ?Patient is a 36 y.o.  male with a history of seizures, hydrocephalus, DM-1-we will adjust left the hospital AMA on 3/14-presented with frequent falls/weakness/pain-he was found to have diabetic ketoacidosis along with traumatic small SAH/SDH on neuroimaging studies.  Patient was managed with supportive care-neurosurgery evaluated patient-and recommended nonoperative management.  Upon physical therapy evaluation-patient was felt to need SNF on discharge.  See below for further details  ? ?Significant events: ?3/8-3/14>> hospitalization for acute metabolic encephalopathy-seizure activity-left AMA. ?3/16>> presented to the ED with fall/weakness-found to have DKA along with small SDH/SAH. ? ?Significant studies: ?3/16>> CXR: No PNA ?3/16>> CT head: Small volume sulcal SAH in the left parietal lobe, new trace bilateral subdural hygromas with minimal superimposed acute SDH. ?3/17>> CT head: Small volume SAH appears stable, bilateral SDH collections appear stable.  Stable chronic lateral and third ventriculomegaly. ? ?Significant microbiology data: ? ? ?Procedures: ?None ? ?Consults: ?Neurosurgery ? ?Subjective: ?Awake-alert-complains of pain in his gluteal area.  Claims Tylenol does not help. ? ?Objective: ?Vitals: ?Blood pressure 125/70, pulse 77, temperature 97.7 ?F (36.5 ?C), temperature source Oral, resp. rate 15, height 6\' 2"  (1.88 m), weight 95.7 kg, SpO2 96 %.  ? ?Exam: ?Gen Exam:Alert awake-not in any distress ?HEENT:atraumatic, normocephalic ?Chest: B/L clear to auscultation anteriorly ?CVS:S1S2 regular ?Abdomen:soft non tender, non distended ?Extremities:no edema ?Neurology: Non focal ?Skin: no rash  ? ?Pertinent Labs/Radiology: ?CBC Latest Ref Rng & Units 01/07/2022  01/06/2022 01/04/2022  ?WBC 4.0 - 10.5 K/uL 7.3 10.0 -  ?Hemoglobin 13.0 - 17.0 g/dL 11.6(L) 10.4(L) 13.9  ?Hematocrit 39.0 - 52.0 % 33.5(L) 30.2(L) 41.0  ?Platelets 150 - 400 K/uL 267 238 -  ?  ?Lab Results  ?Component Value Date  ? NA 139 01/08/2022  ? K 4.3 01/08/2022  ? CL 100 01/08/2022  ? CO2 29 01/08/2022  ?  ? ? ?Assessment/Plan: ?DKA: Resolved with IVF-IV insulin. ? ?DM-1 (A1c 8.0 on 3/16) with uncontrolled hyperglycemia: CBGs remain on the higher side-increase Semglee to 46 units, increase Premeal NovoLog to 10 units-continue SSI and follow.   ? ?Recent Labs  ?  01/08/22 ?01/10/22 01/09/22 ?0753 01/09/22 ?1134  ?GLUCAP 345* 277* 277*  ? ? ? ?Orthostatic hypotension: Likely a sequelae of DM/autonomic neuropathy: Volume status appears stable.  Repeat orthostatic vital signs over the next few days-if required-can start midodrine. ? ?Small traumatic SAH/SDH: Evaluated by neurosurgery-outpatient follow-up recommended.  No concerning findings on exam. ? ?Lytic lesion lesion seen on T7 vertebral body on CT C-spine on 2/21: Per prior documentation by Dr. 3/21 was planned but patient was uncooperative and was not able to be done.  Since this is an incidental finding-MRI has been deferred to outpatient setting/PCP ? ?Seizure disorder: Continue Keppra-once Depakote levels are therapeutic-plan is to discontinue Keppra (see prior documentation by Dr. Henrene Dodge). Pharmacy now assisting in managing depakote. ? ?Bipolar disorder: Per prior documentation-has had angry outbursts during this hospital stay (apparently this is his baseline)-remains on Depakote, Lexapro and Seroquel. ? ?Hypothyroidism: Continue Synthroid -TSH minimally elevated at 6.9-stable for further dosage adjustment by PCP in the outpatient setting. ? ?COVID-19 infection (positive on 3/8): Asymptomatic-does not need isolation. ? ?Polysubstance  abuse (multiple UDS positive for amphetamines, benzos, opiates, cannabinoids): Counseled ? ?Tobacco abuse:  Counseled ? ?BMI: ?Estimated body mass index is 27.09 kg/m? as calculated from the following: ?  Height as of this encounter: 6\' 2"  (1.88 m). ?  Weight as of this encounter: 95.7 kg.  ? ?Code status: ?  Code Status: Full Code  ? ?DVT Prophylaxis: ?SCDs Start: 01/04/22 1125 ?  ? ?Family Communication: None at bedside ? ? ?Disposition Plan: ?Status is: Inpatient ?Remains inpatient appropriate because: Unsteady gait/orthostatic hypotension-recommendations from therapy for SNF.  Remains medically stable for discharge. ?  ?Planned Discharge Destination:Skilled nursing facility ? ? ?Diet: ?Diet Order   ? ?       ?  Diet Carb Modified Fluid consistency: Thin; Room service appropriate? No  Diet effective now       ?  ? ?  ?  ? ?  ?  ? ? ?Antimicrobial agents: ?Anti-infectives (From admission, onward)  ? ? None  ? ?  ? ? ? ?MEDICATIONS: ?Scheduled Meds: ? divalproex  1,000 mg Oral QHS  ? divalproex  500 mg Oral q morning  ? escitalopram  20 mg Oral Daily  ? feeding supplement (GLUCERNA SHAKE)  237 mL Oral BID BM  ? folic acid  1 mg Oral Daily  ? insulin aspart  0-15 Units Subcutaneous TID WC  ? insulin aspart  0-5 Units Subcutaneous QHS  ? insulin aspart  8 Units Subcutaneous TID WC  ? insulin glargine-yfgn  40 Units Subcutaneous Q24H  ? levETIRAcetam  500 mg Oral BID  ? levothyroxine  50 mcg Oral Q0600  ? multivitamin with minerals  1 tablet Oral Daily  ? nicotine  14 mg Transdermal Daily  ? Ensure Max Protein  11 oz Oral QHS  ? QUEtiapine  400 mg Oral QHS  ? thiamine  100 mg Oral Daily  ? ?Continuous Infusions: ?PRN Meds:.acetaminophen, haloperidol lactate, ondansetron (ZOFRAN) IV, oxyCODONE ? ? ?I have personally reviewed following labs and imaging studies ? ?LABORATORY DATA: ?CBC: ?Recent Labs  ?Lab 01/02/22 ?1854 01/04/22 ?[id number] 01/04/22 ?0900 01/06/22 ?0025 01/07/22 ?[id number]  ?WBC 9.6 15.9*  --  10.0 7.3  ?NEUTROABS 6.8 14.3*  --   --   --   ?HGB 15.0 13.2 13.9 10.4* 11.6*  ?HCT 44.9 41.3 41.0 30.2* 33.5*  ?MCV 95.1 99.3   --  92.6 92.3  ?PLT 311 327  --  238 267  ? ? ? ?Basic Metabolic Panel: ?Recent Labs  ?Lab 01/04/22 ?1730 01/05/22 ?1302 01/06/22 ?0025 01/07/22 ?[id number] 01/08/22 ?[id number]  ?NA 140 141 139 139 139  ?K 4.1 3.6 3.7 3.6 4.3  ?CL 106 108 103 105 100  ?CO2 23 26 26 27 29   ?GLUCOSE 199* 114* 274* 163* 315*  ?BUN 24* 12 12 13 16   ?CREATININE 1.20 1.03 1.10 0.89 1.02  ?CALCIUM 9.2 8.7* 8.9 8.8* 9.2  ?MG  --   --  1.6* 1.9  --   ?PHOS  --   --   --   --  3.4  ? ? ? ?GFR: ?Estimated Creatinine Clearance: 114.2 mL/min (by C-G formula based on SCr of 1.02 mg/dL). ? ?Liver Function Tests: ?Recent Labs  ?Lab 01/02/22 ?1854 01/04/22 ?[id number]  ?AST 23 36  ?ALT 17 28  ?ALKPHOS 80 83  ?BILITOT 0.4 1.7*  ?PROT 8.0 7.7  ?ALBUMIN 4.5 4.6  ? ? ?No results for input(s): LIPASE, AMYLASE in the last 168 hours. ?No results for input(s): AMMONIA in the last 168 hours. ? ?  Coagulation Profile: ?No results for input(s): INR, PROTIME in the last 168 hours. ? ?Cardiac Enzymes: ?No results for input(s): CKTOTAL, CKMB, CKMBINDEX, TROPONINI in the last 168 hours. ? ?BNP (last 3 results) ?No results for input(s): PROBNP in the last 8760 hours. ? ?Lipid Profile: ?No results for input(s): CHOL, HDL, LDLCALC, TRIG, CHOLHDL, LDLDIRECT in the last 72 hours. ? ?Thyroid Function Tests: ?No results for input(s): TSH, T4TOTAL, FREET4, T3FREE, THYROIDAB in the last 72 hours. ? ?Anemia Panel: ?No results for input(s): VITAMINB12, FOLATE, FERRITIN, TIBC, IRON, RETICCTPCT in the last 72 hours. ? ?Urine analysis: ?   ?Component Value Date/Time  ? COLORURINE STRAW (A) 01/04/2022 0830  ? APPEARANCEUR CLEAR 01/04/2022 0830  ? APPEARANCEUR Clear 01/20/2015 2136  ? LABSPEC 1.021 01/04/2022 0830  ? LABSPEC 1.023 01/20/2015 2136  ? PHURINE 5.0 01/04/2022 0830  ? GLUCOSEU >=500 (A) 01/04/2022 0830  ? GLUCOSEU >=500 01/20/2015 2136  ? HGBUR SMALL (A) 01/04/2022 0830  ? BILIRUBINUR NEGATIVE 01/04/2022 0830  ? BILIRUBINUR Negative 01/20/2015 2136  ? KETONESUR 80 (A) 01/04/2022 0830  ?  PROTEINUR NEGATIVE 01/04/2022 0830  ? NITRITE NEGATIVE 01/04/2022 0830  ? LEUKOCYTESUR NEGATIVE 01/04/2022 0830  ? LEUKOCYTESUR Negative 01/20/2015 2136  ? ? ?Sepsis Labs: ?Lactic Acid, Venous ?   ?Component Va

## 2022-01-09 NOTE — Plan of Care (Signed)

## 2022-01-09 NOTE — TOC Benefit Eligibility Note (Signed)
Patient Advocate Encounter ? ?Insurance verification completed.   ? ?The patient is currently admitted and upon discharge could be taking Novolog FlexPen. ? ?The current 30 day co-pay is, $4.00.  ? ?The patient is currently admitted and upon discharge could be taking insulin glargine (Lantus) FlexPen. ? ?The current 30 day co-pay is, $4.00.  ? ?The patient is insured through [facility] Medicaid  ? ? ? ?Roland Earl, CPhT ?Pharmacy Patient Advocate Specialist ?[facility] Patient Advocate Team ?Direct Number: [phone]  Fax: [phone] ? ? ? ? ? ?  ?

## 2022-01-10 DIAGNOSIS — R569 Unspecified convulsions: Secondary | ICD-10-CM | POA: Diagnosis not present

## 2022-01-10 DIAGNOSIS — E1010 Type 1 diabetes mellitus with ketoacidosis without coma: Secondary | ICD-10-CM | POA: Diagnosis not present

## 2022-01-10 DIAGNOSIS — U071 COVID-19: Secondary | ICD-10-CM | POA: Diagnosis not present

## 2022-01-10 DIAGNOSIS — F3189 Other bipolar disorder: Secondary | ICD-10-CM | POA: Diagnosis not present

## 2022-01-10 LAB — GLUCOSE, CAPILLARY
Glucose-Capillary: 188 mg/dL — ABNORMAL HIGH (ref 70–99)
Glucose-Capillary: 189 mg/dL — ABNORMAL HIGH (ref 70–99)
Glucose-Capillary: 64 mg/dL — ABNORMAL LOW (ref 70–99)
Glucose-Capillary: 106 mg/dL — ABNORMAL HIGH (ref 70–99)
Glucose-Capillary: 272 mg/dL — ABNORMAL HIGH (ref 70–99)

## 2022-01-10 NOTE — Progress Notes (Addendum)
Physical Therapy Treatment ?Patient Details ?Name: Mark [name] ?MRN: [id number] ?DOB: 1986-02-16 ?Today's Date: 01/10/2022 ? ? ?History of Present Illness 36 y.o. male presenting 01/04/22 with a fall. 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 Neurosurgery consult with no interventions indicated.  He was last admitted fro 3/8-14 with AMS and seizures and left AMA.  PMH significant of seizures; hydrocephalus; DM; polyneuropathy, bipolar disorder ? ?  ?PT Comments  ? ? Patient continues to be limited in gait due to ataxia. Requires 2 person assist for ambulation with RW due to unsteadiness and impulsive movements with RW (especially when turning). Educated in using heel to knee to ankle coordination exercises when supine in bed.  ?   ?Recommendations for follow up therapy are one component of a multi-disciplinary discharge planning process, led by the attending physician.  Recommendations may be updated based on patient status, additional functional criteria and insurance authorization. ? ?Follow Up Recommendations ? Skilled nursing-short term rehab (<3 hours/day) ?  ?  ?Assistance Recommended at Discharge Intermittent Supervision/Assistance  ?Patient can return home with the following Assistance with cooking/housework;Direct supervision/assist for medications management;Direct supervision/assist for financial management;Assist for transportation;Help with stairs or ramp for entrance;Two people to help with walking and/or transfers;Two people to help with bathing/dressing/bathroom ?  ?Equipment Recommendations ? Rolling walker (2 wheels)  ?  ?Recommendations for Other Services   ? ? ?  ?Precautions / Restrictions Precautions ?Precautions: Fall ?Precaution Comments: seizure ?Restrictions ?Weight Bearing Restrictions: No  ?  ? ?Mobility ? Bed Mobility ?Overal bed mobility: Needs Assistance ?Bed Mobility: Supine to Sit, Sit to Supine ?  ?  ?Supine to sit: Min guard ?Sit to  supine: Min guard ?  ?General bed mobility comments: verbap cues and patient used rail to assist ?  ? ?Transfers ?Overall transfer level: Needs assistance ?Equipment used: Rolling walker (2 wheels) ?Transfers: Sit to/from Stand ?Sit to Stand: +2 physical assistance, Min assist ?  ?  ?  ?  ?  ?General transfer comment: +2 assist for safety with min assist of one and min guard of other due to unsteady with mobility and pt moves impulsively ?  ? ?Ambulation/Gait ?Ambulation/Gait assistance: Min assist, +2 safety/equipment ?Gait Distance (Feet): 60 Feet (seated rest, 30 ft) ?Assistive device: Rolling walker (2 wheels) ?Gait Pattern/deviations: Step-through pattern, Ataxic, Narrow base of support ?  ?  ?  ?General Gait Details: pt with strides too long for his ability/stability--able to shorten with min cues; difficulty placing feet appropriately with ataxic quality to his gait; more stable with RW, however erratic when turning--turns improved with cues to turn RW first, then feet but required cues repeated with each turn ? ? ?Stairs ?  ?  ?  ?  ?  ? ? ?Wheelchair Mobility ?  ? ?Modified Rankin (Stroke Patients Only) ?  ? ? ?  ?Balance Overall balance assessment: Needs assistance ?Sitting-balance support: Feet supported ?Sitting balance-Leahy Scale: Fair ?Sitting balance - Comments: able to maintain sitting balance on EOB ?  ?Standing balance support: During functional activity, Bilateral upper extremity supported ?Standing balance-Leahy Scale: Poor ?Standing balance comment: reliant on exeternal support ?  ?  ?  ?  ?  ?  ?  ?  ?  ?  ?  ?  ? ?  ?Cognition Arousal/Alertness: Awake/alert ?Behavior During Therapy: Flat affect, Impulsive ?Overall Cognitive Status: Impaired/Different from baseline ?Area of Impairment: Safety/judgement, Problem solving ?  ?  ?  ?  ?  ?  ?  ?  ?  ?  ?  ?  Following Commands: Follows one step commands inconsistently ?Safety/Judgement: Decreased awareness of safety, Decreased awareness of  deficits ?Awareness: Intellectual ?Problem Solving: Slow processing, Requires tactile cues, Requires verbal cues, Difficulty sequencing ?General Comments: apprehensive on cooperating with therapy due to lack of sleep.  Participated with encourgement.  Frequent cues for safety ?  ?  ? ?  ?Exercises   ? ?  ?General Comments   ?  ?  ? ?Pertinent Vitals/Pain Pain Assessment ?Pain Assessment: Faces ?Faces Pain Scale: Hurts a little bit ?Pain Location: back ?Pain Descriptors / Indicators: Grimacing ?Pain Intervention(s): Monitored during session  ? ? ?Home Living   ?  ?  ?  ?  ?  ?  ?  ?  ?  ?   ?  ?Prior Function    ?  ?  ?   ? ?PT Goals (current goals can now be found in the care plan section) Acute Rehab PT Goals ?Patient Stated Goal: wants to return home ?Time For Goal Achievement: 01/20/22 ?Potential to Achieve Goals: Fair ?Progress towards PT goals: Progressing toward goals ? ?  ?Frequency ? ? ? Min 3X/week ? ? ? ?  ?PT Plan Current plan remains appropriate  ? ? ?Co-evaluation PT/OT/SLP Co-Evaluation/Treatment: Yes ?Reason for Co-Treatment: Necessary to address cognition/behavior during functional activity;For patient/therapist safety;To address functional/ADL transfers ?PT goals addressed during session: Mobility/safety with mobility;Balance;Proper use of DME ?OT goals addressed during session: ADL's and self-care ?  ? ?  ?AM-PAC PT "6 Clicks" Mobility   ?Outcome Measure ? Help needed turning from your back to your side while in a flat bed without using bedrails?: A Little ?Help needed moving from lying on your back to sitting on the side of a flat bed without using bedrails?: A Little ?Help needed moving to and from a bed to a chair (including a wheelchair)?: Total ?Help needed standing up from a chair using your arms (e.g., wheelchair or bedside chair)?: Total ?Help needed to walk in hospital room?: Total ?Help needed climbing 3-5 steps with a railing? : Total ?6 Click Score: 10 ? ?  ?End of Session Equipment  Utilized During Treatment: Gait belt ?Activity Tolerance: Patient tolerated treatment well ?Patient left: with call bell/phone within reach;in bed;with bed alarm set ?  ?PT Visit Diagnosis: Unsteadiness on feet (R26.81);Muscle weakness (generalized) (M62.81);Difficulty in walking, not elsewhere classified (R26.2) ?  ? ? ?Time: 0981-1914 ?PT Time Calculation (min) (ACUTE ONLY): 15 min ? ?Charges:     NO charge; co-treat with OT        ?          ? ? ?Jerolyn Center, PT ?Acute Rehabilitation Services  ?Pager [phone] ?Office [phone] ? ? ? ?Scherrie November Keyston Ardolino ?01/10/2022, 11:44 AM ? ?

## 2022-01-10 NOTE — Progress Notes (Signed)
?      ?                 PROGRESS NOTE ? ?      ?PATIENT DETAILS ?Name: Mark [name] ?Age: 36 y.o. ?Sex: male ?Date of Birth: 05/21/1986 ?Admit Date: 01/04/2022 ?Admitting Physician Karmen Bongo, MD ?[id number], Flonnie Hailstone, NP ? ?Brief Summary: ?Patient is a 36 y.o.  male with a history of seizures, hydrocephalus, DM-1-we will adjust left the hospital AMA on 3/14-presented with frequent falls/weakness/pain-he was found to have diabetic ketoacidosis along with traumatic small SAH/SDH on neuroimaging studies.  Patient was subsequently admitted to the hospitalist service.  See below for further details. ? ?Significant events: ?3/8-3/14>> hospitalization for acute metabolic encephalopathy-seizure activity-left AMA. ?3/16>> presented to the ED with fall/weakness-found to have DKA along with small SDH/SAH. ? ?Significant studies: ?3/16>> CXR: No PNA ?3/16>> CT head: Small volume sulcal SAH in the left parietal lobe, new trace bilateral subdural hygromas with minimal superimposed acute SDH. ?3/17>> CT head: Small volume SAH appears stable, bilateral SDH collections appear stable.  Stable chronic lateral and third ventriculomegaly. ? ?Significant microbiology data: ? ? ?Procedures: ?None ? ?Consults: ?Neurosurgery ? ?Subjective: ? ?He denies any chest pain, shortness of breath, reports no significant events overnight. ? ?Objective: ?Vitals: ?Blood pressure 108/66, pulse 90, temperature 98.3 ?F (36.8 ?C), temperature source Oral, resp. rate 19, height 6\' 2"  (1.88 m), weight 95.7 kg, SpO2 95 %.  ? ?Exam: ? ?Awake Alert, Oriented X 3, no apparent distress  ?symmetrical Chest wall movement, Good air movement bilaterally, CTAB ?RRR,No Gallops,Rubs or new Murmurs, No Parasternal Heave ?+ve B.Sounds, Abd Soft, No tenderness, No rebound - guarding or rigidity. ?No Cyanosis, Clubbing or edema, No new Rash or bruise   ? ? ?Pertinent Labs/Radiology: ? ?  Latest Ref Rng & Units 01/07/2022  ?  7:13 AM 01/06/2022  ? 12:25 AM 01/04/2022  ?   9:00 AM  ?CBC  ?WBC 4.0 - 10.5 K/uL 7.3   10.0     ?Hemoglobin 13.0 - 17.0 g/dL 11.6   10.4   13.9    ?Hematocrit 39.0 - 52.0 % 33.5   30.2   41.0    ?Platelets 150 - 400 K/uL 267   238     ?  ?Lab Results  ?Component Value Date  ? NA 139 01/08/2022  ? K 4.3 01/08/2022  ? CL 100 01/08/2022  ? CO2 29 01/08/2022  ?  ? ? ?Assessment/Plan: ?DKA:  ?- Resolved with IVF-IV insulin. ? ?DM-1 (A1c 8.0 on 3/16) with uncontrolled hyperglycemia:  ?-Overall CBG has improved, but remains labile, will hold on increasing on his Semglee as it was just increased yesterday to 46 units, continue with insulin sliding scale, and Premeal NovoLog of 8 units.   ? ?Recent Labs  ?  01/09/22 ?1617 01/09/22 ?2055 01/10/22 ?0730  ?GLUCAP 228* 91 188*  ? ? ?Orthostatic hypotension:  ?-Likely a sequelae of DM/autonomic neuropathy: Volume status appears stable.  May consider midodrine if remains significant. ? ?Small traumatic SAH/SDH: Evaluated by neurosurgery-outpatient follow-up recommended.  No concerning findings on exam. ? ?Lytic lesion lesion seen on T7 vertebral body on CT C-spine on 2/21: Per prior documentation by Dr. Willeen Niece was planned but patient was uncooperative and was not able to be done.  Since this is an incidental finding-MRI has been deferred to outpatient setting/PCP ? ?Seizure disorder:  ?- Continue Keppra-once Depakote levels are therapeutic-plan is to discontinue Keppra(as this was original plan during recent hospitalization  prior to him signing AMA), follow Depakote level in a.m. ? ?Bipolar disorder: Per prior documentation-has had angry outbursts during this hospital stay (apparently this is his baseline)-remains on Depakote, Lexapro and Seroquel. ? ?Hypothyroidism: Continue Synthroid -TSH minimally elevated at 6.9-stable for further dosage adjustment by PCP in the outpatient setting. ? ?COVID-19 infection (positive on 3/8): Asymptomatic-does not need isolation. ? ?Polysubstance abuse (multiple UDS positive for  amphetamines, benzos, opiates, cannabinoids): Counseled ? ?Tobacco abuse: Counseled ? ?BMI: ?Estimated body mass index is 27.09 kg/m? as calculated from the following: ?  Height as of this encounter: 6\' 2"  (1.88 m). ?  Weight as of this encounter: 95.7 kg.  ? ?Code status: ?  Code Status: Full Code  ? ?DVT Prophylaxis: ?Place and maintain sequential compression device Start: 01/10/22 0938 ?SCDs Start: 01/04/22 1125 ?Not on chemical DVT prophylaxis due to [place]. ?  ? ?Family Communication: None at bedside ? ? ?Disposition Plan: ?Status is: Inpatient ?Remains inpatient appropriate because: Unsteady gait/orthostatic hypotension-recommendations from therapy for SNF. ?  ?Planned Discharge Destination:Skilled nursing facility ? ? ?Diet: ?Diet Order   ? ?       ?  Diet Carb Modified Fluid consistency: Thin; Room service appropriate? No  Diet effective now       ?  ? ?  ?  ? ?  ?  ? ? ?Antimicrobial agents: ?Anti-infectives (From admission, onward)  ? ? None  ? ?  ? ? ? ?MEDICATIONS: ?Scheduled Meds: ? divalproex  1,000 mg Oral QHS  ? divalproex  500 mg Oral q morning  ? escitalopram  20 mg Oral Daily  ? feeding supplement (GLUCERNA SHAKE)  237 mL Oral BID BM  ? folic acid  1 mg Oral Daily  ? insulin aspart  0-15 Units Subcutaneous TID WC  ? insulin aspart  0-5 Units Subcutaneous QHS  ? insulin aspart  10 Units Subcutaneous TID WC  ? insulin glargine-yfgn  46 Units Subcutaneous QHS  ? levETIRAcetam  500 mg Oral BID  ? levothyroxine  50 mcg Oral Q0600  ? multivitamin with minerals  1 tablet Oral Daily  ? nicotine  14 mg Transdermal Daily  ? Ensure Max Protein  11 oz Oral QHS  ? QUEtiapine  400 mg Oral QHS  ? thiamine  100 mg Oral Daily  ? ?Continuous Infusions: ?PRN Meds:.acetaminophen, haloperidol lactate, ondansetron (ZOFRAN) IV, oxyCODONE ? ? ?I have personally reviewed following labs and imaging studies ? ?LABORATORY DATA: ?CBC: ?Recent Labs  ?Lab 01/04/22 ?[id number] 01/04/22 ?0900 01/06/22 ?0025 01/07/22 ?[id number]  ?WBC 15.9*  --   10.0 7.3  ?NEUTROABS 14.3*  --   --   --   ?HGB 13.2 13.9 10.4* 11.6*  ?HCT 41.3 41.0 30.2* 33.5*  ?MCV 99.3  --  92.6 92.3  ?PLT 327  --  238 267  ? ? ?Basic Metabolic Panel: ?Recent Labs  ?Lab 01/04/22 ?1730 01/05/22 ?1302 01/06/22 ?0025 01/07/22 ?[id number] 01/08/22 ?[id number]  ?NA 140 141 139 139 139  ?K 4.1 3.6 3.7 3.6 4.3  ?CL 106 108 103 105 100  ?CO2 23 26 26 27 29   ?GLUCOSE 199* 114* 274* 163* 315*  ?BUN 24* 12 12 13 16   ?CREATININE 1.20 1.03 1.10 0.89 1.02  ?CALCIUM 9.2 8.7* 8.9 8.8* 9.2  ?MG  --   --  1.6* 1.9  --   ?PHOS  --   --   --   --  3.4  ? ? ?GFR: ?Estimated Creatinine Clearance: 114.2 mL/min (by C-G formula based  on SCr of 1.02 mg/dL). ? ?Liver Function Tests: ?Recent Labs  ?Lab 01/04/22 ?0843  ?AST 36  ?ALT 28  ?ALKPHOS 83  ?BILITOT 1.7*  ?PROT 7.7  ?ALBUMIN 4.6  ? ?No results for input(s): LIPASE, AMYLASE in the last 168 hours. ?No results for input(s): AMMONIA in the last 168 hours. ? ?Coagulation Profile: ?No results for input(s): INR, PROTIME in the last 168 hours. ? ?Cardiac Enzymes: ?No results for input(s): CKTOTAL, CKMB, CKMBINDEX, TROPONINI in the last 168 hours. ? ?BNP (last 3 results) ?No results for input(s): PROBNP in the last 8760 hours. ? ?Lipid Profile: ?No results for input(s): CHOL, HDL, LDLCALC, TRIG, CHOLHDL, LDLDIRECT in the last 72 hours. ? ?Thyroid Function Tests: ?No results for input(s): TSH, T4TOTAL, FREET4, T3FREE, THYROIDAB in the last 72 hours. ? ?Anemia Panel: ?No results for input(s): VITAMINB12, FOLATE, FERRITIN, TIBC, IRON, RETICCTPCT in the last 72 hours. ? ?Urine analysis: ?   ?Component Value Date/Time  ? COLORURINE STRAW (A) 01/04/2022 0830  ? APPEARANCEUR CLEAR 01/04/2022 0830  ? APPEARANCEUR Clear 01/20/2015 2136  ? LABSPEC 1.021 01/04/2022 0830  ? LABSPEC 1.023 01/20/2015 2136  ? PHURINE 5.0 01/04/2022 0830  ? GLUCOSEU >=500 (A) 01/04/2022 0830  ? GLUCOSEU >=500 01/20/2015 2136  ? HGBUR SMALL (A) 01/04/2022 0830  ? [place] NEGATIVE 01/04/2022 0830  ? BILIRUBINUR  Negative 01/20/2015 2136  ? KETONESUR 80 (A) 01/04/2022 0830  ? PROTEINUR NEGATIVE 01/04/2022 0830  ? NITRITE NEGATIVE 01/04/2022 0830  ? LEUKOCYTESUR NEGATIVE 01/04/2022 0830  ? LEUKOCYTESUR Negative 03/

## 2022-01-10 NOTE — Progress Notes (Signed)
Occupational Therapy Treatment ?Patient Details ?Name: Mark [name] ?MRN: [id number] ?DOB: 1986/07/22 ?Today's Date: 01/10/2022 ? ? ?History of present illness 36 y.o. male presenting 01/04/22 with a fall. 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 Neurosurgery consult with no interventions indicated.  He was last admitted fro 3/8-14 with AMS and seizures and left AMA.  PMH significant of seizures; hydrocephalus; DM; polyneuropathy, bipolar disorder ?  ?OT comments ? Patient making progress with OT/PT treatment. Patient able to get to EOB with min guard and states decreased dizziness.  Patient was +2 for mobility due to unsteady balance.  Patient continues to demonstrate limited activity tolerance with therapy.  Acute OT to continue to follow.   ? ?Recommendations for follow up therapy are one component of a multi-disciplinary discharge planning process, led by the attending physician.  Recommendations may be updated based on patient status, additional functional criteria and insurance authorization. ?   ?Follow Up Recommendations ? Skilled nursing-short term rehab (<3 hours/day)  ?  ?Assistance Recommended at Discharge Frequent or constant Supervision/Assistance  ?Patient can return home with the following ? Assistance with cooking/housework;Direct supervision/assist for medications management;Assist for transportation;Help with stairs or ramp for entrance;A lot of help with walking and/or transfers;Two people to help with walking and/or transfers;A lot of help with bathing/dressing/bathroom ?  ?Equipment Recommendations ? BSC/3in1  ?  ?Recommendations for Other Services   ? ?  ?Precautions / Restrictions Precautions ?Precautions: Fall ?Precaution Comments: seizure ?Restrictions ?Weight Bearing Restrictions: No  ? ? ?  ? ?Mobility Bed Mobility ?Overal bed mobility: Needs Assistance ?Bed Mobility: Supine to Sit, Sit to Supine ?  ?  ?Supine to sit: Min guard ?Sit to  supine: Min guard ?  ?General bed mobility comments: verbap cues and patient used rail to assist ?  ? ?Transfers ?Overall transfer level: Needs assistance ?Equipment used: Rolling walker (2 wheels) ?Transfers: Sit to/from Stand ?Sit to Stand: +2 physical assistance ?  ?  ?  ?  ?  ?General transfer comment: +2 assist for safety with min assist of one and min guard of other due to unsteady with mobility ?  ?  ?Balance Overall balance assessment: Needs assistance ?Sitting-balance support: Feet supported ?Sitting balance-Leahy Scale: Fair ?Sitting balance - Comments: able to maintain sitting balance on EOB ?  ?Standing balance support: During functional activity, Bilateral upper extremity supported ?Standing balance-Leahy Scale: Poor ?Standing balance comment: reliant on exeternal support ?  ?  ?  ?  ?  ?  ?  ?  ?  ?  ?  ?   ? ?ADL either performed or assessed with clinical judgement  ? ?ADL   ?  ?  ?  ?  ?  ?  ?  ?  ?  ?  ?  ?  ?  ?  ?  ?  ?  ?  ?  ?  ?  ? ?Extremity/Trunk Assessment Upper Extremity Assessment ?RUE Deficits / Details: grossly weak 4/5, poor coordination ?RUE Sensation: WNL ?RUE Coordination: decreased fine motor ?  ?  ?  ?  ?  ? ?Vision   ?  ?  ?Perception   ?  ?Praxis   ?  ? ?Cognition Arousal/Alertness: Awake/alert ?Behavior During Therapy: Flat affect ?Overall Cognitive Status: Impaired/Different from baseline ?Area of Impairment: Safety/judgement, Problem solving ?  ?  ?  ?  ?  ?  ?  ?  ?  ?Current Attention Level: Focused ?Memory: Decreased short-term  memory ?Following Commands: Follows one step commands inconsistently, Follows one step commands with increased time ?Safety/Judgement: Decreased awareness of safety, Decreased awareness of deficits ?Awareness: Intellectual ?Problem Solving: Slow processing, Decreased initiation, Requires tactile cues, Requires verbal cues, Difficulty sequencing ?General Comments: apprehensive on cooperating with therapy due to lack of sleep.  Participated with  encourgement.  Frequent cues for safety ?  ?  ?   ?Exercises   ? ?  ?Shoulder Instructions   ? ? ?  ?General Comments    ? ? ?Pertinent Vitals/ Pain       Pain Assessment ?Pain Assessment: Faces ?Faces Pain Scale: Hurts a little bit ?Pain Location: back ?Pain Descriptors / Indicators: Grimacing ?Pain Intervention(s): Monitored during session, Repositioned ? ?Home Living   ?  ?  ?  ?  ?  ?  ?  ?  ?  ?  ?  ?  ?  ?  ?  ?  ?  ?  ? ?  ?Prior Functioning/Environment    ?  ?  ?  ?   ? ?Frequency ? Min 2X/week  ? ? ? ? ?  ?Progress Toward Goals ? ?OT Goals(current goals can now be found in the care plan section) ? Progress towards OT goals: Progressing toward goals ? ?Acute Rehab OT Goals ?Patient Stated Goal: get better ?OT Goal Formulation: With patient ?Time For Goal Achievement: 01/20/22 ?Potential to Achieve Goals: Fair ?ADL Goals ?Pt Will Perform Grooming: with modified independence;standing ?Pt Will Perform Lower Body Dressing: with modified independence;sit to/from stand ?Pt Will Transfer to Toilet: with modified independence;ambulating ?Additional ADL Goal #1: Pt will independently complete medication management task. ?Additional ADL Goal #2: Pt will follow 3 step directional task independently as a precursor to safe IADL.  ?Plan Discharge plan remains appropriate   ? ?Co-evaluation ? ? ? PT/OT/SLP Co-Evaluation/Treatment: Yes ?Reason for Co-Treatment: For patient/therapist safety;To address functional/ADL transfers ?  ?OT goals addressed during session: ADL's and self-care ?  ? ?  ?AM-PAC OT "6 Clicks" Daily Activity     ?Outcome Measure ? ? Help from another person eating meals?: None ?Help from another person taking care of personal grooming?: A Little ?Help from another person toileting, which includes using toliet, bedpan, or urinal?: A Lot ?Help from another person bathing (including washing, rinsing, drying)?: A Lot ?Help from another person to put on and taking off regular upper body clothing?: A  Little ?Help from another person to put on and taking off regular lower body clothing?: A Lot ?6 Click Score: 16 ? ?  ?End of Session Equipment Utilized During Treatment: Gait belt;Rolling walker (2 wheels) ? ?OT Visit Diagnosis: Unsteadiness on feet (R26.81);Other abnormalities of gait and mobility (R26.89);Muscle weakness (generalized) (M62.81) ?  ?Activity Tolerance Patient tolerated treatment well ?  ?Patient Left in bed;with call bell/phone within reach;with bed alarm set ?  ?Nurse Communication Mobility status ?  ? ?   ? ?Time: [id number] ?OT Time Calculation (min): 15 min ? ?Charges: OT General Charges ?$OT Visit: 1 Visit ?OT Treatments ?$Therapeutic Activity: 8-22 mins ? ?Lodema Hong, OTA ?Acute Rehabilitation Services  ?Pager [phone] ?Office [phone] ? ? ?[place] ?01/10/2022, 11:25 AM ?

## 2022-01-10 NOTE — Progress Notes (Signed)
Requested patient be placed on STAR program with [facility] leadership. ? ?Mark [name] ?LCSW, MSW, MHA ? ?
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
## 2022-01-11 ENCOUNTER — Other Ambulatory Visit (HOSPITAL_COMMUNITY): Payer: Self-pay

## 2022-01-11 DIAGNOSIS — F3160 Bipolar disorder, current episode mixed, unspecified: Secondary | ICD-10-CM | POA: Diagnosis not present

## 2022-01-11 DIAGNOSIS — E1010 Type 1 diabetes mellitus with ketoacidosis without coma: Secondary | ICD-10-CM | POA: Diagnosis not present

## 2022-01-11 DIAGNOSIS — S06360A Traumatic hemorrhage of cerebrum, unspecified, without loss of consciousness, initial encounter: Secondary | ICD-10-CM | POA: Diagnosis not present

## 2022-01-11 LAB — GLUCOSE, CAPILLARY
Glucose-Capillary: 121 mg/dL — ABNORMAL HIGH (ref 70–99)
Glucose-Capillary: 198 mg/dL — ABNORMAL HIGH (ref 70–99)
Glucose-Capillary: 195 mg/dL — ABNORMAL HIGH (ref 70–99)

## 2022-01-11 LAB — BASIC METABOLIC PANEL
Anion gap: 7 (ref 5–15)
BUN: 21 mg/dL — ABNORMAL HIGH (ref 6–20)
CO2: 29 mmol/L (ref 22–32)
Calcium: 9.4 mg/dL (ref 8.9–10.3)
Chloride: 103 mmol/L (ref 98–111)
Creatinine, Ser: 0.97 mg/dL (ref 0.61–1.24)
GFR, Estimated: >60 mL/min (ref >60)
Glucose, Bld: 109 mg/dL — ABNORMAL HIGH (ref 70–99)
Potassium: 4.2 mmol/L (ref 3.5–5.1)
Sodium: 139 mmol/L (ref 135–145)

## 2022-01-11 LAB — CBC
HCT: 39.2 % (ref 39.0–52.0)
Hemoglobin: 13.2 g/dL (ref 13.0–17.0)
MCH: 31.6 pg (ref 26.0–34.0)
MCHC: 33.7 g/dL (ref 30.0–36.0)
MCV: 93.8 fL (ref 80.0–100.0)
Platelets: 337 10*3/uL (ref 150–400)
RBC: 4.18 MIL/uL — ABNORMAL LOW (ref 4.22–5.81)
RDW: 12.3 % (ref 11.5–15.5)
WBC: 8.6 10*3/uL (ref 4.0–10.5)
nRBC: 0 % (ref 0.0–0.2)

## 2022-01-11 LAB — VALPROIC ACID LEVEL: Valproic Acid Lvl: 60 ug/mL (ref 50.0–100.0)

## 2022-01-11 MED ORDER — HUMULIN 70/30 KWIKPEN (70-30) 100 UNIT/ML ~~LOC~~ SUPN
35.0000 [IU] | PEN_INJECTOR | Freq: Two times a day (BID) | SUBCUTANEOUS | 0 refills | Status: AC
  Filled 2022-01-11: qty 21, 30d supply, fill #0

## 2022-01-11 MED ORDER — INSULIN GLARGINE-YFGN 100 UNIT/ML ~~LOC~~ SOLN
40.0000 [IU] | Freq: Every day | SUBCUTANEOUS | Status: DC
  Filled 2022-01-11: qty 0.4

## 2022-01-11 MED ORDER — DIVALPROEX SODIUM 500 MG PO DR TAB
DELAYED_RELEASE_TABLET | ORAL | 0 refills | Status: AC
  Filled 2022-01-11: qty 90, 30d supply, fill #0

## 2022-01-11 MED ORDER — ENOXAPARIN SODIUM 40 MG/0.4ML IJ SOSY
40.0000 mg | PREFILLED_SYRINGE | Freq: Every day | INTRAMUSCULAR | Status: DC
  Administered 2022-01-11: 40 mg via SUBCUTANEOUS
  Filled 2022-01-11: qty 0.4

## 2022-01-11 MED ORDER — INSULIN PEN NEEDLE 32G X 4 MM MISC
0 refills | Status: AC
Start: 2022-01-11 — End: ?
  Filled 2022-01-11: qty 100, 25d supply, fill #0

## 2022-01-11 MED ORDER — QUETIAPINE FUMARATE 200 MG PO TABS
400.0000 mg | ORAL_TABLET | Freq: Every day | ORAL | 0 refills | Status: AC
  Filled 2022-01-11: qty 60, 30d supply, fill #0

## 2022-01-11 MED ORDER — LEVOTHYROXINE SODIUM 50 MCG PO TABS
50.0000 ug | ORAL_TABLET | Freq: Every day | ORAL | 0 refills | Status: AC
  Filled 2022-01-11: qty 30, 30d supply, fill #0

## 2022-01-11 NOTE — Discharge Instructions (Signed)
Follow with Primary MD Rema Fendt, NP in 7 days  ? ?Get CBC, CMP, checked  by Primary MD next visit.  ? ? ?Activity: As tolerated with Full fall precautions use walker/cane & assistance as needed ? ? ?Disposition Home  ? ? ?Diet: Carb Modified ? ? ?On your next visit with your primary care physician please Get Medicines reviewed and adjusted. ? ? ?Please request your Prim.MD to go over all Hospital Tests and Procedure/Radiological results at the follow up, please get all Hospital records sent to your Prim MD by signing hospital release before you go home. ? ? ?If you experience worsening of your admission symptoms, develop shortness of breath, life threatening emergency, suicidal or homicidal thoughts you must seek medical attention immediately by calling 911 or calling your MD immediately  if symptoms less severe. ? ?You Must read complete instructions/literature along with all the possible adverse reactions/side effects for all the Medicines you take and that have been prescribed to you. Take any new Medicines after you have completely understood and accpet all the possible adverse reactions/side effects.  ? ?Do not drive, operating heavy machinery, perform activities at heights, swimming or participation in water activities or provide baby sitting services if your were admitted for syncope or siezures until you have seen by Primary MD or a Neurologist and advised to do so again. ? ?Do not drive when taking Pain medications.  ? ? ?Do not take more than prescribed Pain, Sleep and Anxiety Medications ? ?Special Instructions: If you have smoked or chewed Tobacco  in the last 2 yrs please stop smoking, stop any regular Alcohol  and or any Recreational drug use. ? ?Wear Seat belts while driving. ? ? ?Please note ? ?You were cared for by a hospitalist during your hospital stay. If you have any questions about your discharge medications or the care you received while you were in the hospital after you are  discharged, you can call the unit and asked to speak with the hospitalist on call if the hospitalist that took care of you is not available. Once you are discharged, your primary care physician will handle any further medical issues. Please note that NO REFILLS for any discharge medications will be authorized once you are discharged, as it is imperative that you return to your primary care physician (or establish a relationship with a primary care physician if you do not have one) for your aftercare needs so that they can reassess your need for medications and monitor your lab values.  ?

## 2022-01-11 NOTE — Progress Notes (Signed)
Pt refused labs this morning.  

## 2022-01-11 NOTE — TOC Progression Note (Signed)
Transition of Care (TOC) - Progression Note  ? ? ?Patient Details  ?Name: GRASON DRAWHORN ?MRN: [id number] ?Date of Birth: [birth year] ? ?Transition of Care (TOC) CM/SW Contact  ?Benard Halsted, LCSW ?Phone Number: ?01/11/2022, 3:34 PM ? ?Clinical Narrative:    ?CSW received notice that patient is requesting to leave and needs a ride. Taxi voucher placed on hard chart. SA resource placed on AVS. RNCM to see if patient interested in outpatient PT.  ? ? ?Expected Discharge Plan: Home/Self Care ?Barriers to Discharge: Inadequate or no insurance, Requiring sitter/restraints, Continued Medical Work up, Active Substance Use - Placement ? ?Expected Discharge Plan and Services ?Expected Discharge Plan: Home/Self Care ?In-house Referral: Clinical Social Work ?Discharge Planning Services: CM Consult ?  ?  ?Expected Discharge Date: 01/11/22               ?  ?  ?  ?  ?  ?  ?  ?  ?  ?  ? ? ?Social Determinants of Health (SDOH) Interventions ?  ? ?Readmission Risk Interventions ? ?  03/25/2020  ?  3:53 PM  ?Readmission Risk Prevention Plan  ?Transportation Screening Complete  ?Medication Review Press photographer) Complete  ?PCP or Specialist appointment within 3-5 days of discharge Complete  ?SW Recovery Care/Counseling Consult Complete  ?Palliative Care Screening Not Applicable  ?[place] Not Applicable  ? ? ?

## 2022-01-11 NOTE — Progress Notes (Signed)
Patient ID: Mark [name], male   DOB: 1986-08-11, 36 y.o.   MRN: [id number] ?I reviewed this patient's CT scans and noted his clinical status has been stable.  He has a small amount of subarachnoid hemorrhage in the cortical region on the left parietal side and small amount of subdural hemorrhage was noted last week.  There is no contraindication to full anticoagulation if needed for DVT prophylaxis or treatment of pulmonary emboli if that is the case. ?

## 2022-01-11 NOTE — TOC Transition Note (Addendum)
Transition of Care (TOC) - CM/SW Discharge Note ? ? ?Patient Details  ?Name: Mark [name] ?MRN: [id number] ?Date of Birth: [birth year] ? ?Transition of Care (TOC) CM/SW Contact:  ?Cyndi Bender, RN ?Phone Number: ?01/11/2022, 3:42 PM ? ? ?Clinical Narrative:    ?Patient stable for discharge.  ?Spoke to patient regarding transition needs. ?Patient agreeable to go to outpatient rehab. [address] is the closest OP rehab.  ?Referral sent and information put on AVS. Walker and 3&1 ordered from [employer] to be delivered to the room. ?Medicaid transportation number placed on AVS when the patient needs assistant to get to apts.  ?Address, Phone number and PCP verified.  ? ?Final next level of care: OP Rehab ?Barriers to Discharge: Barriers Resolved ? ? ?Patient Goals and CMS Choice ?Patient states their goals for this hospitalization and ongoing recovery are:: return home ?CMS Medicare.gov Compare Post Acute Care list provided to:: Patient ?Choice offered to / list presented to : Patient ? ?Discharge Placement ?  ?           ?  ?  ?  ? ome ? ?Discharge Plan and Services ?In-house Referral: Clinical Social Work ?Discharge Planning Services: CM Consult ?           ?DME Arranged: 3-N-1, Walker ?DME Agency: [employer] ?Date DME Agency Contacted: 01/11/22 ?Time DME Agency Contacted: [id number] ?Representative spoke with at DME Agency: Freda Munro ?  ?  ?  ?  ?  ? ?Social Determinants of Health (SDOH) Interventions ?  ? ? ?Readmission Risk Interventions ? ?  01/11/2022  ?  3:42 PM 03/25/2020  ?  3:53 PM  ?Readmission Risk Prevention Plan  ?Transportation Screening Complete Complete  ?PCP or Specialist Appt within 3-5 Days Complete   ?[place] or Home Care Consult Complete   ?Palliative Care Screening Not Applicable   ?Medication Review Press photographer) Complete Complete  ?PCP or Specialist appointment within 3-5 days of discharge  Complete  ?SW Recovery Care/Counseling Consult  Complete  ?Palliative Care Screening  Not Applicable  ?[place]  Not Applicable  ? ? ? ? ? ?

## 2022-01-11 NOTE — Discharge Summary (Addendum)
Physician Discharge Summary  ?ESWIN WORRELL PNT:[id number] DOB: 1985/11/18 DOA: 01/04/2022 ? ?PCP: Rema Fendt, NP ? ?Admit date: 01/04/2022 ?Discharge date: 01/11/2022 ? ?Admitted From: Home, ?Disposition:  Home ? ?Recommendations for Outpatient Follow-up:  ?Follow up with PCP in 1-2 weeks ?Please obtain BMP/CBC in one week ?Patient will need follow-up regarding incidental finding of Lytic lesion lesion seen on T7 vertebral body on CT C-spine on 2/21 ? ?Home Health:NO ?Equipment/Devices: Walker, 3 in  1 ? ?Discharge Condition:Stable ?CODE STATUS:FULL, ?Diet recommendation: Carb Modified  ? ?Brief/Interim Summary: ? ?Patient is a 36 y.o.  male with a history of seizures, hydrocephalus, DM-1-we will adjust left the hospital AMA on 3/14-presented with frequent falls/weakness/pain-he was found to have diabetic ketoacidosis along with traumatic small SAH/SDH on neuroimaging studies.  Patient was subsequently admitted to the hospitalist service.  See below for further details. ?  ?Significant events: ?3/8-3/14>> hospitalization for acute metabolic encephalopathy-seizure activity-left AMA. ?3/16>> presented to the ED with fall/weakness-found to have DKA along with small SDH/SAH. ?  ?Significant studies: ?3/16>> CXR: No PNA ?3/16>> CT head: Small volume sulcal SAH in the left parietal lobe, new trace bilateral subdural hygromas with minimal superimposed acute SDH. ?3/17>> CT head: Small volume SAH appears stable, bilateral SDH collections appear stable.  Stable chronic lateral and third ventriculomegaly. ? ? ?DKA:  ?- Resolved with IVF-IV insulin. ?  ?DM-1 (A1c 8.0 on 3/16) with uncontrolled hyperglycemia:  ?-A1c was 8 on 3/16, overall has CBG well controlled on Semglee 40 units, he will be discharged on his home insulin 70/30, 35 units twice daily, he was instructed about importance of compliance. ?  ?Recent Labs (last 2 labs)   ?     ?Recent Labs  ?  01/10/22 ?1703 01/10/22 ?2048 01/11/22 ?0867  ?GLUCAP 106* 272* 121*   ?  ?  ?  ?Orthostatic hypotension:  ?-This has resolved, no further orthostatics after hydration. ?  ?Small traumatic SAH/SDH: Evaluated by neurosurgery-outpatient follow-up recommended.  No concerning findings on exam. ?  ?Lytic lesion lesion seen on T7 vertebral body on CT C-spine on 2/21: Per prior documentation by Dr. Henrene Dodge was planned but patient was uncooperative and was not able to be done.  Since this is an incidental finding-MRI has been deferred to outpatient setting/PCP ?  ?Seizure disorder:  ?-Patient was on Keppra till his Depakote level is therapeutic, Depakote level this morning is 60 which is within therapeutic range, so Keppra has been discontinued.  Patient will be discharged on Depakote DR 500 mg every morning and 1000 mg at bedtime. ?  ?Bipolar disorder: Per prior documentation-has had angry outbursts during this hospital stay (apparently this is his baseline)-remains on Depakote, Lexapro and Seroquel. ?-Coherent, appropriate, and with intact judgment and insight, he is adamant about leaving today, he will be discharged so we can arrange for his meds to be delivered from [facility] pharmacy. ?  ?Hypothyroidism: Continue Synthroid -TSH minimally elevated at 6.9-stable for further dosage adjustment by PCP in the outpatient setting. ?  ?COVID-19 infection (positive on 3/8): Asymptomatic-does not need isolation. ?  ?Polysubstance abuse (multiple UDS positive for amphetamines, benzos, opiates, cannabinoids): Counseled ?  ?Tobacco abuse: Counseled ?  ?BMI: ?Estimated body mass index is 27.09 kg/m? as calculated from the following: ?  Height as of this encounter: 6\' 2"  (1.88 m). ?  Weight as of this encounter: 95.7 kg.  ? ?Discharge Diagnoses:  ?Principal Problem: ?  DKA (diabetic ketoacidosis) (HCC) ?Active Problems: ?  ICH (intracerebral hemorrhage) (HCC) ?  Hypothyroidism ?  Other bipolar disorder (HCC) ?  Polysubstance abuse (HCC) ?  Tobacco dependence ?  Seizures (HCC) ?  Lytic lesion of bone on  x-ray ?  COVID-19 virus infection ? ? ? ?Discharge Instructions ? ?Discharge Instructions   ? ? Ambulatory referral to Occupational Therapy   Complete by: As directed ?  ? Ambulatory referral to Physical Therapy   Complete by: As directed ?  ? Ambulatory referral to Physical Therapy   Complete by: As directed ?  ? Diet - low sodium heart healthy   Complete by: As directed ?  ? Discharge instructions   Complete by: As directed ?  ? Follow with Primary MD Rema Fendt, NP in 7 days  ? ?Get CBC, CMP, checked  by Primary MD next visit.  ? ? ?Activity: As tolerated with Full fall precautions use walker/cane & assistance as needed ? ? ?Disposition Home  ? ? ?Diet: Carb Modified ? ? ?On your next visit with your primary care physician please Get Medicines reviewed and adjusted. ? ? ?Please request your Prim.MD to go over all Hospital Tests and Procedure/Radiological results at the follow up, please get all Hospital records sent to your Prim MD by signing hospital release before you go home. ? ? ?If you experience worsening of your admission symptoms, develop shortness of breath, life threatening emergency, suicidal or homicidal thoughts you must seek medical attention immediately by calling 911 or calling your MD immediately  if symptoms less severe. ? ?You Must read complete instructions/literature along with all the possible adverse reactions/side effects for all the Medicines you take and that have been prescribed to you. Take any new Medicines after you have completely understood and accpet all the possible adverse reactions/side effects.  ? ?Do not drive, operating heavy machinery, perform activities at heights, swimming or participation in water activities or provide baby sitting services if your were admitted for syncope or siezures until you have seen by Primary MD or a Neurologist and advised to do so again. ? ?Do not drive when taking Pain medications.  ? ? ?Do not take more than prescribed Pain, Sleep and  Anxiety Medications ? ?Special Instructions: If you have smoked or chewed Tobacco  in the last 2 yrs please stop smoking, stop any regular Alcohol  and or any Recreational drug use. ? ?Wear Seat belts while driving. ? ? ?Please note ? ?You were cared for by a hospitalist during your hospital stay. If you have any questions about your discharge medications or the care you received while you were in the hospital after you are discharged, you can call the unit and asked to speak with the hospitalist on call if the hospitalist that took care of you is not available. Once you are discharged, your primary care physician will handle any further medical issues. Please note that NO REFILLS for any discharge medications will be authorized once you are discharged, as it is imperative that you return to your primary care physician (or establish a relationship with a primary care physician if you do not have one) for your aftercare needs so that they can reassess your need for medications and monitor your lab values.  ? Increase activity slowly   Complete by: As directed ?  ? ?  ? ?Allergies as of 01/11/2022   ? ?   Reactions  ? Mushroom Extract Complex Hives, Itching, Nausea And Vomiting  ? Penicillins Anaphylaxis, Hives, Swelling  ? Has patient had a PCN reaction causing  immediate rash, facial/tongue/throat swelling, SOB or lightheadedness with hypotension: Yes ?Has patient had a PCN reaction causing severe rash involving mucus membranes or skin necrosis: No ?Has patient had a PCN reaction that required hospitalization: Yes ?Has patient had a PCN reaction occurring within the last 10 years: Yes ?If all of the above answers are "NO", then may proceed with Cephalosporin use. ?Tolerated ceftriaxone 03/24/20  ? Sulfa Antibiotics Anaphylaxis, Hives  ? Clindamycin/lincomycin Hives  ? ?  ? ?  ?Medication List  ?  ? ?STOP taking these medications   ? ?ibuprofen 200 MG tablet ?Commonly known as: ADVIL ?  ?levETIRAcetam 500 MG  tablet ?Commonly known as: Keppra ?  ? ?  ? ?TAKE these medications   ? ?Accu-Chek Guide test strip ?Generic drug: glucose blood ?Check blood glucose 4 times daily.Before breakfast,lunch and dinner. Also before bed tim

## 2022-01-11 NOTE — Progress Notes (Signed)
Discharge instructions gone over with patient.  Made sure patient knew how to use insulin pens.  Pt. Instructed on importance of making all follow up appointments.  Pt. Instructed to call if he had any questions.  Pt. Instructed to use walker for ambulation to avoid falls.  Pt. Leaving with all meds, walker, and BSC in hand. ?

## 2022-01-11 NOTE — Progress Notes (Signed)
PT Cancellation Note ? ?Patient Details ?Name: Mark [name] ?MRN: [id number] ?DOB: Jul 02, 1986 ? ? ?Cancelled Treatment:    Reason Eval/Treat Not Completed: Fatigue/lethargy limiting ability to participate ? ?Patient politely asking to rest and do therapy later. Explained I have a second person now who could help him walk and he continued to politely decline. Will return if able to arrange a second person to assist.  ? ? ?Jerolyn Center, PT ?Acute Rehabilitation Services  ?Pager [phone] ?Office [phone] ? ? ?Scherrie November [place] Sorn ?01/11/2022, 2:13 PM ?

## 2022-01-11 NOTE — Progress Notes (Addendum)
?      ?                 PROGRESS NOTE ? ?      ?PATIENT DETAILS ?Name: Mark [name] ?Age: 36 y.o. ?Sex: male ?Date of Birth: 11/04/1985 ?Admit Date: 01/04/2022 ?Admitting Physician Jonah BlueJennifer Yates, MD ?WJX:[id number]:Stephens, Salomon FickAmy J, NP ? ?Brief Summary: ?Patient is a 36 y.o.  male with a history of seizures, hydrocephalus, DM-1-we will adjust left the hospital AMA on 3/14-presented with frequent falls/weakness/pain-he was found to have diabetic ketoacidosis along with traumatic small SAH/SDH on neuroimaging studies.  Patient was subsequently admitted to the hospitalist service.  See below for further details. ? ?Significant events: ?3/8-3/14>> hospitalization for acute metabolic encephalopathy-seizure activity-left AMA. ?3/16>> presented to the ED with fall/weakness-found to have DKA along with small SDH/SAH. ? ?Significant studies: ?3/16>> CXR: No PNA ?3/16>> CT head: Small volume sulcal SAH in the left parietal lobe, new trace bilateral subdural hygromas with minimal superimposed acute SDH. ?3/17>> CT head: Small volume SAH appears stable, bilateral SDH collections appear stable.  Stable chronic lateral and third ventriculomegaly. ? ?Significant microbiology data: ? ? ?Procedures: ?None ? ?Consults: ?Neurosurgery ?Psychiatry ? ?Subjective: ? ?He denies any chest pain, shortness of breath, reports no significant events overnight. ?-He was irritable this morning,  refusing care, but he calmed down later in the day(was calm and appropriate when I saw him) ? ?Objective: ?Vitals: ?Blood pressure 98/66, pulse 90, temperature 97.6 ?F (36.4 ?C), temperature source Oral, resp. rate 18, height 6\' 2"  (1.88 m), weight 95.7 kg, SpO2 97 %.  ? ?Exam: ? ?Awake Alert, Oriented X 3, No new F.N deficits, Normal affect ?Symmetrical Chest wall movement, Good air movement bilaterally, CTAB ?RRR,No Gallops,Rubs or new Murmurs, No Parasternal Heave ?+ve B.Sounds, Abd Soft, No tenderness, No rebound - guarding or rigidity. ?No Cyanosis, Clubbing  or edema, No new Rash or bruise   ? ? ? ?Pertinent Labs/Radiology: ? ?  Latest Ref Rng & Units 01/11/2022  ?  7:49 AM 01/07/2022  ?  7:13 AM 01/06/2022  ? 12:25 AM  ?CBC  ?WBC 4.0 - 10.5 K/uL 8.6   7.3   10.0    ?Hemoglobin 13.0 - 17.0 g/dL 62.113.2   30.811.6   65.710.4    ?Hematocrit 39.0 - 52.0 % 39.2   33.5   30.2    ?Platelets 150 - 400 K/uL 337   267   238    ?  ?Lab Results  ?Component Value Date  ? NA 139 01/11/2022  ? K 4.2 01/11/2022  ? CL 103 01/11/2022  ? CO2 29 01/11/2022  ?  ? ? ?Assessment/Plan: ? ?DKA:  ?- Resolved with IVF-IV insulin. ? ?DM-1 (A1c 8.0 on 3/16) with uncontrolled hyperglycemia:  ?-Overall CBG has improved, but remains labile, he had low reading of 64 yesterday, so I will decrease his Semglee to 40 units at bedtime, continue with insulin sliding scale, and Premeal NovoLog of 8 units.   ? ?Recent Labs  ?  01/10/22 ?1703 01/10/22 ?2048 01/11/22 ?[id number]  ?GLUCAP 106* 272* 121*  ? ? ?Orthostatic hypotension:  ?-Likely a sequelae of DM/autonomic neuropathy: Volume status appears stable.  May consider midodrine if remains significant. ? ?Small traumatic SAH/SDH: Evaluated by neurosurgery-outpatient follow-up recommended.  No concerning findings on exam. ? ?Lytic lesion lesion seen on T7 vertebral body on CT C-spine on 2/21: Per prior documentation by Dr. Henrene DodgePahwani-MRI was planned but patient was uncooperative and was not able to be done.  Since this is an incidental finding-MRI has been deferred to outpatient setting/PCP ? ?Seizure disorder:  ?-Patient was on Keppra till his Depakote level is therapeutic, Depakote level this morning is 60 which is within therapeutic range, so Keppra has been discontinued.   ? ?Bipolar disorder: Per prior documentation-has had angry outbursts during this hospital stay (apparently this is his baseline)-remains on Depakote, Lexapro and Seroquel. ? ?Hypothyroidism: Continue Synthroid -TSH minimally elevated at 6.9-stable for further dosage adjustment by PCP in the outpatient  setting. ? ?COVID-19 infection (positive on 3/8): Asymptomatic-does not need isolation. ? ?Polysubstance abuse (multiple UDS positive for amphetamines, benzos, opiates, cannabinoids): Counseled ? ?Tobacco abuse: Counseled ? ?BMI: ?Estimated body mass index is 27.09 kg/m? as calculated from the following: ?  Height as of this encounter: 6\' 2"  (1.88 m). ?  Weight as of this encounter: 95.7 kg.  ? ?Code status: ?  Code Status: Full Code  ? ?DVT Prophylaxis: ?Place and maintain sequential compression device Start: 01/10/22 0938 ?SCDs Start: 01/04/22 1125 ?Patient will be started on subcu Lovenox DVT prophylaxis dose as discussed with Dr. 01/06/22 as it is few days from his SAH/SDH, and he has not been ambulating much and high risk for DVT.  He was noncompliant with SCDs. ?  ? ?Family Communication: None at bedside ? ? ?Disposition Plan: ?Status is: Inpatient ?Remains inpatient appropriate because: Unsteady gait/orthostatic hypotension-recommendations from therapy for SNF. ?  ?Planned Discharge Destination:Skilled nursing facility ? ? ?Diet: ?Diet Order   ? ?       ?  Diet Carb Modified Fluid consistency: Thin; Room service appropriate? No  Diet effective now       ?  ? ?  ?  ? ?  ?  ? ? ?Antimicrobial agents: ?Anti-infectives (From admission, onward)  ? ? None  ? ?  ? ? ? ?MEDICATIONS: ?Scheduled Meds: ? divalproex  1,000 mg Oral QHS  ? divalproex  500 mg Oral q morning  ? escitalopram  20 mg Oral Daily  ? feeding supplement (GLUCERNA SHAKE)  237 mL Oral BID BM  ? folic acid  1 mg Oral Daily  ? insulin aspart  0-15 Units Subcutaneous TID WC  ? insulin aspart  0-5 Units Subcutaneous QHS  ? insulin aspart  10 Units Subcutaneous TID WC  ? insulin glargine-yfgn  46 Units Subcutaneous QHS  ? levothyroxine  50 mcg Oral Q0600  ? multivitamin with minerals  1 tablet Oral Daily  ? nicotine  14 mg Transdermal Daily  ? Ensure Max Protein  11 oz Oral QHS  ? QUEtiapine  400 mg Oral QHS  ? thiamine  100 mg Oral Daily  ? ?Continuous  Infusions: ?PRN Meds:.acetaminophen, haloperidol lactate, ondansetron (ZOFRAN) IV ? ? ?I have personally reviewed following labs and imaging studies ? ?LABORATORY DATA: ?CBC: ?Recent Labs  ?Lab 01/06/22 ?0025 01/07/22 ?01/09/22 01/11/22 ?0749  ?WBC 10.0 7.3 8.6  ?HGB 10.4* 11.6* 13.2  ?HCT 30.2* 33.5* 39.2  ?MCV 92.6 92.3 93.8  ?PLT 238 267 337  ? ? ?Basic Metabolic Panel: ?Recent Labs  ?Lab 01/05/22 ?1302 01/06/22 ?0025 01/07/22 ?01/09/22 01/08/22 ?01/10/22 01/11/22 ?01/13/22  ?NA 141 139 139 139 139  ?K 3.6 3.7 3.6 4.3 4.2  ?CL 108 103 105 100 103  ?CO2 26 26 27 29 29   ?GLUCOSE 114* 274* 163* 315* 109*  ?BUN 12 12 13 16  21*  ?CREATININE 1.03 1.10 0.89 1.02 0.97  ?CALCIUM 8.7* 8.9 8.8* 9.2 9.4  ?MG  --  1.6* 1.9  --   --   ?  PHOS  --   --   --  3.4  --   ? ? ?GFR: ?Estimated Creatinine Clearance: 120.1 mL/min (by C-G formula based on SCr of 0.97 mg/dL). ? ?Liver Function Tests: ?No results for input(s): AST, ALT, ALKPHOS, BILITOT, PROT, ALBUMIN in the last 168 hours. ? ?No results for input(s): LIPASE, AMYLASE in the last 168 hours. ?No results for input(s): AMMONIA in the last 168 hours. ? ?Coagulation Profile: ?No results for input(s): INR, PROTIME in the last 168 hours. ? ?Cardiac Enzymes: ?No results for input(s): CKTOTAL, CKMB, CKMBINDEX, TROPONINI in the last 168 hours. ? ?BNP (last 3 results) ?No results for input(s): PROBNP in the last 8760 hours. ? ?Lipid Profile: ?No results for input(s): CHOL, HDL, LDLCALC, TRIG, CHOLHDL, LDLDIRECT in the last 72 hours. ? ?Thyroid Function Tests: ?No results for input(s): TSH, T4TOTAL, FREET4, T3FREE, THYROIDAB in the last 72 hours. ? ?Anemia Panel: ?No results for input(s): VITAMINB12, FOLATE, FERRITIN, TIBC, IRON, RETICCTPCT in the last 72 hours. ? ?Urine analysis: ?   ?Component Value Date/Time  ? COLORURINE STRAW (A) 01/04/2022 0830  ? APPEARANCEUR CLEAR 01/04/2022 0830  ? APPEARANCEUR Clear 01/20/2015 2136  ? LABSPEC 1.021 01/04/2022 0830  ? LABSPEC 1.023 01/20/2015 2136  ? PHURINE  5.0 01/04/2022 0830  ? GLUCOSEU >=500 (A) 01/04/2022 0830  ? GLUCOSEU >=500 01/20/2015 2136  ? HGBUR SMALL (A) 01/04/2022 0830  ? BILIRUBINUR NEGATIVE 01/04/2022 0830  ? BILIRUBINUR Negative 01/20/2015 2136

## 2022-01-12 ENCOUNTER — Telehealth: Payer: Self-pay

## 2022-01-12 NOTE — Telephone Encounter (Signed)
Transition Care Management Follow-up Telephone Call ?Date of discharge and from where: 01/02/2022 - [place] - left AMA and then returned to ED twice on 01/02/2022 and discharged on 01/11/2022 ? ?Attempts:  1st Attempt ? ?Reason for unsuccessful TCM follow-up call:  Left voice message unable to reach.  ?

## 2022-01-15 ENCOUNTER — Telehealth: Payer: Self-pay

## 2022-01-15 NOTE — Telephone Encounter (Signed)
Transition Care Management Follow-up Telephone Call ?Date of discharge and from where: 01/11/2022, [facility]  ?How have you been since you were released from the hospital? He said he is " hurting all over" and stated that it feels like he has been exercising all day. He explained that the pain started as soon as he left the hospital and nothing he takes helps the pain  ?Any questions or concerns? Yes - noted above ? ?Items Reviewed: ?Did the pt receive and understand the discharge instructions provided?  He said he is not sure where they are.  ?Medications obtained and verified? Yes  - he said he has all of his medications but nothing helps with the pain.  He has a glucometer.  ?Other? No  ?Any new allergies since your discharge? No  ?Dietary orders reviewed? No ?Do you have support at home? Yes  ? ?Home Care and Equipment/Supplies: ?Were home health services ordered? no ?If so, what is the name of the agency? N/a  ?Has the agency set up a time to come to the patient's home? not applicable ?Were any new equipment or medical supplies ordered?  Yes: RW,3:1 commode.  ?What is the name of the medical supply agency? [facility]  ?Were you able to get the supplies/equipment? yes ?Do you have any questions related to the use of the equipment or supplies? No ? ?He has been referred for outpatient PT/OT ? ?Functional Questionnaire: (I = Independent and D = Dependent) ?ADLs: has RW for ambulation  ? ? ?Follow up appointments reviewed: ? ?PCP Hospital f/u appt confirmed? Yes  Scheduled to see Durene Fruits, 01/17/2022.  ?[facility] f/u appt confirmed?  None scheduled at this time.    ?Are transportation arrangements needed? Yes  - the number or Medicaid transportation was text to him as he requested.  Instructed him to call today to schedule his ride for 01/17/2022  ?If their condition worsens, is the pt aware to call PCP or go to the Emergency Dept.? Yes ?Was the patient provided with contact information for  the PCP's office or ED? Yes ?Was to pt encouraged to call back with questions or concerns? Yes ? ?

## 2022-01-17 ENCOUNTER — Ambulatory Visit: Payer: Medicaid Other | Admitting: Family

## 2022-02-05 ENCOUNTER — Other Ambulatory Visit: Payer: Self-pay

## 2022-02-05 ENCOUNTER — Observation Stay (HOSPITAL_COMMUNITY)
Admission: EM | Admit: 2022-02-05 | Discharge: 2022-02-06 | Discharge status: Against Medical Advice | Payer: Medicaid Other | Attending: Internal Medicine | Admitting: Internal Medicine

## 2022-02-05 ENCOUNTER — Encounter (HOSPITAL_COMMUNITY): Payer: Self-pay

## 2022-02-05 ENCOUNTER — Emergency Department (HOSPITAL_COMMUNITY): Payer: Medicaid Other

## 2022-02-05 DIAGNOSIS — F17210 Nicotine dependence, cigarettes, uncomplicated: Secondary | ICD-10-CM | POA: Diagnosis not present

## 2022-02-05 DIAGNOSIS — R569 Unspecified convulsions: Secondary | ICD-10-CM | POA: Diagnosis not present

## 2022-02-05 DIAGNOSIS — Z79899 Other long term (current) drug therapy: Secondary | ICD-10-CM | POA: Insufficient documentation

## 2022-02-05 DIAGNOSIS — E039 Hypothyroidism, unspecified: Secondary | ICD-10-CM | POA: Diagnosis not present

## 2022-02-05 DIAGNOSIS — Z794 Long term (current) use of insulin: Secondary | ICD-10-CM | POA: Insufficient documentation

## 2022-02-05 DIAGNOSIS — G40919 Epilepsy, unspecified, intractable, without status epilepticus: Secondary | ICD-10-CM | POA: Diagnosis not present

## 2022-02-05 DIAGNOSIS — M899 Disorder of bone, unspecified: Secondary | ICD-10-CM | POA: Diagnosis present

## 2022-02-05 DIAGNOSIS — F17200 Nicotine dependence, unspecified, uncomplicated: Secondary | ICD-10-CM | POA: Diagnosis present

## 2022-02-05 DIAGNOSIS — E10649 Type 1 diabetes mellitus with hypoglycemia without coma: Secondary | ICD-10-CM

## 2022-02-05 DIAGNOSIS — E162 Hypoglycemia, unspecified: Secondary | ICD-10-CM | POA: Diagnosis not present

## 2022-02-05 DIAGNOSIS — F3189 Other bipolar disorder: Secondary | ICD-10-CM | POA: Diagnosis present

## 2022-02-05 DIAGNOSIS — I619 Nontraumatic intracerebral hemorrhage, unspecified: Secondary | ICD-10-CM | POA: Diagnosis present

## 2022-02-05 DIAGNOSIS — M898X9 Other specified disorders of bone, unspecified site: Secondary | ICD-10-CM | POA: Diagnosis present

## 2022-02-05 DIAGNOSIS — F1910 Other psychoactive substance abuse, uncomplicated: Secondary | ICD-10-CM | POA: Diagnosis present

## 2022-02-05 DIAGNOSIS — D72829 Elevated white blood cell count, unspecified: Secondary | ICD-10-CM

## 2022-02-05 LAB — CBC WITH DIFFERENTIAL/PLATELET
Abs Immature Granulocytes: 0.08 10*3/uL — ABNORMAL HIGH (ref 0.00–0.07)
Basophils Absolute: 0 10*3/uL (ref 0.0–0.1)
Basophils Relative: 0 %
Eosinophils Absolute: 0.1 10*3/uL (ref 0.0–0.5)
Eosinophils Relative: 1 %
HCT: 39.4 % (ref 39.0–52.0)
Hemoglobin: 13.4 g/dL (ref 13.0–17.0)
Immature Granulocytes: 1 %
Lymphocytes Relative: 11 %
Lymphs Abs: 1.6 10*3/uL (ref 0.7–4.0)
MCH: 31.6 pg (ref 26.0–34.0)
MCHC: 34 g/dL (ref 30.0–36.0)
MCV: 92.9 fL (ref 80.0–100.0)
Monocytes Absolute: 0.7 10*3/uL (ref 0.1–1.0)
Monocytes Relative: 5 %
Neutro Abs: 12.3 10*3/uL — ABNORMAL HIGH (ref 1.7–7.7)
Neutrophils Relative %: 82 %
Platelets: 315 10*3/uL (ref 150–400)
RBC: 4.24 MIL/uL (ref 4.22–5.81)
RDW: 12.4 % (ref 11.5–15.5)
WBC: 14.8 10*3/uL — ABNORMAL HIGH (ref 4.0–10.5)
nRBC: 0 % (ref 0.0–0.2)

## 2022-02-05 LAB — T4, FREE: Free T4: 0.73 ng/dL (ref 0.61–1.12)

## 2022-02-05 LAB — COMPREHENSIVE METABOLIC PANEL
ALT: 12 U/L (ref 0–44)
AST: 22 U/L (ref 15–41)
Albumin: 4.4 g/dL (ref 3.5–5.0)
Alkaline Phosphatase: 89 U/L (ref 38–126)
Anion gap: 9 (ref 5–15)
BUN: 12 mg/dL (ref 6–20)
CO2: 25 mmol/L (ref 22–32)
Calcium: 9.6 mg/dL (ref 8.9–10.3)
Chloride: 108 mmol/L (ref 98–111)
Creatinine, Ser: 1.06 mg/dL (ref 0.61–1.24)
GFR, Estimated: >60 mL/min (ref >60)
Glucose, Bld: <20 mg/dL — CL (ref 70–99)
Potassium: 3.6 mmol/L (ref 3.5–5.1)
Sodium: 142 mmol/L (ref 135–145)
Total Bilirubin: 0.5 mg/dL (ref 0.3–1.2)
Total Protein: 8 g/dL (ref 6.5–8.1)

## 2022-02-05 LAB — URINALYSIS, ROUTINE W REFLEX MICROSCOPIC
Bacteria, UA: NONE SEEN
Bilirubin Urine: NEGATIVE
Glucose, UA: 50 mg/dL — AB
Ketones, ur: NEGATIVE mg/dL
Leukocytes,Ua: NEGATIVE
Nitrite: NEGATIVE
Protein, ur: 30 mg/dL — AB
Specific Gravity, Urine: 1.02 (ref 1.005–1.030)
pH: 5 (ref 5.0–8.0)

## 2022-02-05 LAB — CBG MONITORING, ED
Glucose-Capillary: 46 mg/dL — ABNORMAL LOW (ref 70–99)
Glucose-Capillary: 118 mg/dL — ABNORMAL HIGH (ref 70–99)
Glucose-Capillary: 272 mg/dL — ABNORMAL HIGH (ref 70–99)
Glucose-Capillary: 95 mg/dL (ref 70–99)
Glucose-Capillary: 33 mg/dL — CL (ref 70–99)
Glucose-Capillary: 96 mg/dL (ref 70–99)
Glucose-Capillary: 344 mg/dL — ABNORMAL HIGH (ref 70–99)

## 2022-02-05 LAB — CK: Total CK: 405 U/L — ABNORMAL HIGH (ref 49–397)

## 2022-02-05 LAB — TSH: TSH: 2.478 u[IU]/mL (ref 0.350–4.500)

## 2022-02-05 LAB — MAGNESIUM: Magnesium: 2.1 mg/dL (ref 1.7–2.4)

## 2022-02-05 LAB — VALPROIC ACID LEVEL: Valproic Acid Lvl: 14 ug/mL — ABNORMAL LOW (ref 50.0–100.0)

## 2022-02-05 LAB — BETA-HYDROXYBUTYRIC ACID: Beta-Hydroxybutyric Acid: 0.2 mmol/L (ref 0.05–0.27)

## 2022-02-05 LAB — RAPID URINE DRUG SCREEN, HOSP PERFORMED
Amphetamines: POSITIVE — AB
Barbiturates: NOT DETECTED
Benzodiazepines: NOT DETECTED
Cocaine: NOT DETECTED
Opiates: NOT DETECTED
Tetrahydrocannabinol: POSITIVE — AB

## 2022-02-05 LAB — ETHANOL: Alcohol, Ethyl (B): <10 mg/dL (ref <10)

## 2022-02-05 MED ORDER — VALPROATE SODIUM 100 MG/ML IV SOLN
500.0000 mg | Freq: Once | INTRAVENOUS | Status: DC
  Filled 2022-02-05: qty 5

## 2022-02-05 MED ORDER — LACTATED RINGERS IV BOLUS
1000.0000 mL | Freq: Once | INTRAVENOUS | Status: AC
  Administered 2022-02-05: 1000 mL via INTRAVENOUS

## 2022-02-05 MED ORDER — VALPROATE SODIUM 100 MG/ML IV SOLN
750.0000 mg | Freq: Once | INTRAVENOUS | Status: AC
  Administered 2022-02-05: 750 mg via INTRAVENOUS
  Filled 2022-02-05: qty 7.5

## 2022-02-05 MED ORDER — LORAZEPAM 2 MG/ML IJ SOLN
2.0000 mg | Freq: Once | INTRAMUSCULAR | Status: AC
  Administered 2022-02-05: 2 mg via INTRAVENOUS
  Filled 2022-02-05: qty 1

## 2022-02-05 MED ORDER — DEXTROSE 50 % IV SOLN
INTRAVENOUS | Status: AC
  Filled 2022-02-05: qty 50

## 2022-02-05 MED ORDER — LEVOTHYROXINE SODIUM 25 MCG PO TABS: 50.0000 ug | ORAL_TABLET | Freq: Every day | ORAL | Status: DC

## 2022-02-05 MED ORDER — ACETAMINOPHEN 650 MG RE SUPP: 650.0000 mg | Freq: Four times a day (QID) | RECTAL | Status: DC | PRN

## 2022-02-05 MED ORDER — DEXTROSE 10 % IV SOLN: INTRAVENOUS | Status: DC

## 2022-02-05 MED ORDER — ACETAMINOPHEN 325 MG PO TABS: 650.0000 mg | ORAL_TABLET | Freq: Four times a day (QID) | ORAL | Status: DC | PRN

## 2022-02-05 MED ORDER — NICOTINE 21 MG/24HR TD PT24
21.0000 mg | MEDICATED_PATCH | Freq: Every day | TRANSDERMAL | Status: DC
  Administered 2022-02-05 – 2022-02-06 (×2): 21 mg via TRANSDERMAL
  Filled 2022-02-05 (×2): qty 1

## 2022-02-05 MED ORDER — VALPROATE SODIUM 100 MG/ML IV SOLN
500.0000 mg | Freq: Once | INTRAVENOUS | Status: AC
  Administered 2022-02-05: 500 mg via INTRAVENOUS
  Filled 2022-02-05: qty 5

## 2022-02-05 MED ORDER — HYDROCODONE-ACETAMINOPHEN 5-325 MG PO TABS
1.0000 | ORAL_TABLET | ORAL | Status: DC | PRN
  Administered 2022-02-05: 2 via ORAL
  Filled 2022-02-05: qty 2

## 2022-02-05 MED ORDER — DEXTROSE 50 % IV SOLN: 1.0000 | Freq: Once | INTRAVENOUS | Status: AC

## 2022-02-05 MED ORDER — ESCITALOPRAM OXALATE 10 MG PO TABS
20.0000 mg | ORAL_TABLET | Freq: Every day | ORAL | Status: DC
  Administered 2022-02-06: 20 mg via ORAL
  Filled 2022-02-05: qty 2

## 2022-02-05 MED ORDER — FENTANYL CITRATE PF 50 MCG/ML IJ SOSY
25.0000 ug | PREFILLED_SYRINGE | Freq: Once | INTRAMUSCULAR | Status: AC
  Administered 2022-02-05: 25 ug via INTRAVENOUS
  Filled 2022-02-05: qty 1

## 2022-02-05 MED ORDER — KETOROLAC TROMETHAMINE 30 MG/ML IJ SOLN
30.0000 mg | Freq: Once | INTRAMUSCULAR | Status: AC
  Administered 2022-02-05: 30 mg via INTRAVENOUS
  Filled 2022-02-05: qty 1

## 2022-02-05 MED ORDER — ONDANSETRON HCL 4 MG/2ML IJ SOLN: 4.0000 mg | Freq: Four times a day (QID) | INTRAMUSCULAR | Status: DC | PRN

## 2022-02-05 MED ORDER — QUETIAPINE FUMARATE 200 MG PO TABS
400.0000 mg | ORAL_TABLET | Freq: Every day | ORAL | Status: DC
Start: 2022-02-05 — End: 2022-02-06
  Administered 2022-02-05: 400 mg via ORAL
  Filled 2022-02-05 (×2): qty 1
  Filled 2022-02-05: qty 2

## 2022-02-05 MED ORDER — ONDANSETRON HCL 4 MG PO TABS: 4.0000 mg | ORAL_TABLET | Freq: Four times a day (QID) | ORAL | Status: DC | PRN

## 2022-02-05 NOTE — Assessment & Plan Note (Signed)
Abnormal TSH in past with fluctuating level ?Repeat TSH/free t4 today ?Continue with home synthroid dose for now  ?

## 2022-02-05 NOTE — Assessment & Plan Note (Addendum)
36 year old male with history of seizures presenting for 3 witnessed seizures likely in setting of medication noncompliance as he has not been taking any medication in past 3 weeks. He verifies he isn't sure if he has taken them ?-obs to [facility] ?-neurology consulted ?-MRI brain/eeg ?-neuro checks ?-IV valproic acid ?-seizure precaution  ?

## 2022-02-05 NOTE — Assessment & Plan Note (Addendum)
Presenting with BS of <20 ?Despite dextrose x 2, blood sugars continued to drop and EdP started dextrose 10 IV ?Will check CBG q hourly x 4 and once BS stable x 3 readings can stop drip. Also asked to page if BS >200 ?Resume home insulin when hypoglycemia resolves ?A1C on 01/04/22 was 8.0  ?Routine accuechecks once hypoglycemia resolves  ?Alert and oriented and protecting airway. Okay for diet  ?

## 2022-02-05 NOTE — ED Notes (Signed)
Pt provided [place] sandwhich and diet coke.  ?

## 2022-02-05 NOTE — Procedures (Signed)
Patient Name: Mark [name]  ?MRN: [id number]  ?Epilepsy Attending: Charlsie Quest  ?Referring Physician/Provider: Milon Dikes, MD ?Date: 02/05/2022 ?Duration: 24.01 mins ? ?Patient history: 36 y.o. 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 EEG to evaluate for seizure.  ? ?Level of alertness: Awake, asleep ? ?AEDs during EEG study: VPA ? ?Technical aspects: This EEG study was done with scalp electrodes positioned according to the 10-20 International system of electrode placement. Electrical activity was acquired at a sampling rate of 500Hz  and reviewed with a high frequency filter of 70Hz  and a low frequency filter of 1Hz . EEG data were recorded continuously and digitally stored.  ? ?Description: No posterior dominant rhythm was seen. Sleep was characterized by vertex waves, sleep spindles (12 to 14 Hz), maximal frontocentral region.  EEG showed continuous generalized 3 to 6 Hz theta-delta slowing. Hyperventilation and photic stimulation were not performed.    ? ?ABNORMALITY ?- Continuous slow, generalized ? ?IMPRESSION: ?This study is suggestive of moderate diffuse encephalopathy, nonspecific etiology. No seizures or epileptiform discharges were seen throughout the recording. ? ?  ? ?

## 2022-02-05 NOTE — Consult Note (Addendum)
NEUROLOGY CONSULTATION NOTE  ? ?Date of service: February 05, 2022 ?Patient Name: Mark [name] ?MRN:  [id number] ?DOB:  Feb 27, 1986 ?Reason for consult: "provoked seizure" ?Requesting Provider: Terald Sleeper, MD ? ?History of Present Illness  ?Mark [name] is a 36 y.o. 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  ? ?He presented to [facility] 01/03/2022 for reported seizure, then admitted 01/04/2022-01/11/2022 for DKA. During that time he was switched from keppra to depakote and d/c'd on depakote 500 mg qAM & 1000 mg qPM.   ? ?On assessment, patient had eyes closed, awake and responsive to verbal stimuli. Poor engagement on eval, but will answer questions about dogs. Declined to answer orientation questions.  ? ?VPA lvl subtherapeutic, UDS + amphetamines and THC, glc 20.  ?  ?ROS  ? ?Reported headaches. Declined to answer other questions.  ?Past History  ? ?Past Medical History:  ?Diagnosis Date  ? Anxiety   ? Bipolar disorder (HCC)   ? Depression   ? Diabetes mellitus without complication (HCC)   ? Diabetic polyneuropathy associated with type 1 diabetes mellitus (HCC)   ? Hydrocephalus (HCC)   ? Kidney stones   ? Seizures (HCC)   ? Truncal ataxia   ? ?Past Surgical History:  ?Procedure Laterality Date  ? BRAIN SURGERY    ? hydrocephalus  ? KIDNEY STONE SURGERY    ? ?Family History  ?Problem Relation Age of Onset  ? Breast cancer Mother   ? Other Father   ?     unsure of medical history  ? Diabetes Paternal Aunt   ? ?Social History  ? ?Socioeconomic History  ? Marital status: Single  ?  Spouse name: Not on file  ? Number of children: 0  ? Years of education: 9th grade  ? Highest education level: Not on file  ?Occupational History  ? Occupation: seeking disability  ?Tobacco Use  ? Smoking status: Every Day  ?  Packs/day: 1.00  ?  Years: 20.00   ?  Pack years: 20.00  ?  Types: Cigarettes  ? Smokeless tobacco: Never  ?Vaping Use  ? Vaping Use: Never used  ?Substance and Sexual Activity  ? Alcohol use: Yes  ?  Comment: 2-3 drinks per day  ? Drug use: Yes  ?  Frequency: 7.0 times per week  ?  Types: Marijuana  ?  Comment: smoke daily  ? Sexual activity: Not Currently  ?Other Topics Concern  ? Not on file  ?Social History Narrative  ? Lives with a roommate.  ? Right-handed.  ? Drinks 2 liter of soda each day.  ? ?Social Determinants of Health  ? ?Financial Resource Strain: Medium Risk  ? Difficulty of Paying Living Expenses: Somewhat hard  ?Food Insecurity: Food Insecurity Present  ? Worried About Programme researcher, broadcasting/film/video in the Last Year: Sometimes true  ? Ran Out of Food in the Last Year: Sometimes true  ?Transportation Needs: Unmet Transportation Needs  ? Lack of Transportation (Medical): Yes  ? Lack of Transportation (Non-Medical): Yes  ?Physical Activity: Not on file  ?Stress: Not on file  ?Social Connections: Not on file  ? ?Allergies  ?Allergen Reactions  ? Mushroom Extract Complex Hives, Itching and Nausea And Vomiting  ? Penicillins Anaphylaxis, Hives and Swelling  ?  Has patient had a PCN reaction causing immediate rash, facial/tongue/throat swelling, SOB or  lightheadedness with hypotension: Yes ?Has patient had a PCN reaction causing severe rash involving mucus membranes or skin necrosis: No ?Has patient had a PCN reaction that required hospitalization: Yes ?Has patient had a PCN reaction occurring within the last 10 years: Yes ?If all of the above answers are "NO", then may proceed with Cephalosporin use. ?Tolerated ceftriaxone 03/24/20  ? Sulfa Antibiotics Anaphylaxis and Hives  ? Clindamycin/Lincomycin Hives  ? ? ?Medications  ?(Not in a hospital admission) ?  ? ?Vitals  ? ?Vitals:  ? 02/05/22 1215 02/05/22 1345 02/05/22 1445 02/05/22 1515  ?BP: (!) 120/92 121/80 119/70 128/80  ?Pulse: 69 75  99  ?Resp: 17 15 13  (!) 25  ?Temp:      ?TempSrc:      ?SpO2:  100% 100% 97% 98%  ?  ? ?There is no height or weight on file to calculate BMI. ? ?Physical Exam  ?General: Laying comfortably in bed; in no acute distress.  ?Pulmonary: Symmetric chest rise. Non-labored respiratory effort.  ? ?Neurologic Examination  ?Mental status/Cognition:  ?Alert. Oriented to self. Declined to answer questions. ?Speech/language:  ?Fluent, thought content appropriate. No dysarthria. Appropriate response to questions.  ? ?Cranial Nerves: ?VII: symmetric, no facial droop noted when talking ?VIII: hearing normal bilaterally ?IX,X: cough intact ?XI: bilateral shoulder shrug and head turn ?Motor: ?Able to move all 4 limbs spontaneously against gravity. ?normal tone throughout. normal bulk throughout. no atrophy noted ? ?Labs  ? ?CBC:  ?Recent Labs  ?Lab 02/05/22 ?1020  ?WBC 14.8*  ?NEUTROABS 12.3*  ?HGB 13.4  ?HCT 39.4  ?MCV 92.9  ?PLT 315  ? ? ?Basic Metabolic Panel:  ?Lab Results  ?Component Value Date  ? NA 142 02/05/2022  ? K 3.6 02/05/2022  ? CO2 25 02/05/2022  ? GLUCOSE <20 (LL) 02/05/2022  ? BUN 12 02/05/2022  ? CREATININE 1.06 02/05/2022  ? CALCIUM 9.6 02/05/2022  ? GFRNONAA >60 02/05/2022  ? GFRAA >60 06/26/2020  ? ?Lipid Panel:  ?Lab Results  ?Component Value Date  ? LDLCALC 153 (H) 03/05/2020  ? ?HgbA1c:  ?Lab Results  ?Component Value Date  ? HGBA1C 8.0 (H) 01/04/2022  ? ?Urine Drug Screen:  ?   ?Component Value Date/Time  ? LABOPIA NONE DETECTED 02/05/2022 1215  ? COCAINSCRNUR NONE DETECTED 02/05/2022 1215  ? COCAINSCRNUR NONE DETECTED 05/31/2018 2003  ? LABBENZ NONE DETECTED 02/05/2022 1215  ? AMPHETMU POSITIVE (A) 02/05/2022 1215  ? THCU POSITIVE (A) 02/05/2022 1215  ? LABBARB NONE DETECTED 02/05/2022 1215  ?  ?Alcohol Level  ?   ?Component Value Date/Time  ? ETH <10 02/05/2022 1020  ? ?CT Head without contrast: ?IMPRESSION: There is interval resolution of subarachnoid hemorrhage seen in the posterior left cerebral hemisphere. There is interval resolution of small amount of  intraventricular blood. There is dilation of third and both lateral ventricles which may suggest aqueduct stenosis or normal pressure hydrocephalus ? ?rEEG:  ?Pending ?Impression  ?Kathy Wares is a 36 y.o. 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 He was d/c'd last month on Depakote 1500mg  daily. Admission VPA level subtherapeutic,. ? ?IMP ?-Breakthrough sz ?-Non compliance to meds ?-Substance use ?-Prior SI ?-Hypoglycemia ? ? ?Recommendations  ?- Seizure precautions ?- SSI ?- rEEG ?- ED loaded patient with 500 mg VPA. Give additional 750mg  VPA for total 1250mg . ?- MR brain w+w/o  at some point ?-Has a significant psychiatric history.  Once more awake, needs to be plugged into psychiatry outpatient practice. ?-Has had previous SI and attempts with OD including OD with insulin. Management of DM/sugars per IM. ?- We will continue to follow ? ? ?Signed: ?Princess BruinsJulie Nguyen, DO ?Resident, PGY-1 ?[facility] ?02/05/2022, 4:30 PM   ? ?SEIZURE PRECAUTIONS ?[place], patients with seizures are not allowed to drive until they have been seizure-free for six months.  ? ?Use caution when using heavy equipment or power tools. Avoid working on ladders or at heights. Take showers instead of baths. Ensure the water temperature is not too high on the home water heater. Do not go swimming alone. Do not lock yourself in a room alone (i.e. bathroom). When caring for infants or small children, sit down when holding, feeding, or changing them to minimize risk of injury to the child in the event you have a seizure. Maintain good sleep hygiene. Avoid alcohol.  ?  ?If patient has another seizure, call 911 and bring them back to the ED if: ?A.  The seizure lasts longer than 5 minutes.       ?B.  The patient doesn't wake shortly after the seizure or has new problems such as difficulty seeing, speaking or moving following the seizure ?C.  The patient was injured during the seizure ?D.  The patient

## 2022-02-05 NOTE — H&P (Addendum)
?History and Physical  ? ? ?Patient: Mark [name] QTM:[id number] DOB: [birth year] ?DOA: 02/05/2022 ?DOS: the patient was seen and examined on 02/05/2022 ?PCP: Camillia Herter, NP  ?Patient coming from: Home - lives with friend: [name] ? ? ?Chief Complaint: breakthrough seizure  ? ?HPI: Mark [name] is a 36 y.o. male with medical history significant of seizures; hydrocephalus, bipolar, polysubstance abuse and DM type 1 who presented to ED with complaints of seizure and also found to have hypoglycemia.  ?He had 3 witnessed seizures, including 2 with EMS. Continues to be intermittently be confused while in ED. He apparently has not been taking any medication since his hospital discharge three weeks ago. He states he has a bad headache, but denies any other complaints. He would like to eat. He is alert and talking with no confusion on exam.  ? ?Recently hospitalized from 3/16-3/23/23 for DKA with traumatic small SAH/SDH on neuroimaging.  ? ? ?Denies any fever/chills, vision changes, chest pain or palpitations, shortness of breath or cough, abdominal pain, N/V/D, dysuria or leg swelling.  ? ?Smokes 1PPD, no alcohol. +MJ. States he took someones adhd medication  ? ?ER Course:  vitals: afebrile, bp: 144/94, HR; 93, RR: 17, oxygen: 98% RA ?Pertinent labs: wbc: 14.8, glucose <20, CK 405, UDS: + amphetamines, +THC  ?CTH: interval resolution of SAH in posterior left hemisphere. Interval resolution of small amount of intraventricular blood. There is dilation of third and both lateral ventricles which may ?suggest aqueduct stenosis or normal pressure hydrocephalus. This ?finding has not changed significantly. ?In ED: neurology consulted. Given 2L IVF, IV valproate. MRI brain and eeg ordered. Dextrose given>D10 ? ? ? ? ?Review of Systems: As mentioned in the history of present illness. All other systems reviewed and are negative. ?Past Medical History:  ?Diagnosis Date  ? Anxiety   ? Bipolar disorder ([place])   ?  Depression   ? Diabetes mellitus without complication ([place])   ? Diabetic polyneuropathy associated with type 1 diabetes mellitus ([place])   ? Hydrocephalus ([place])   ? Kidney stones   ? Seizures ([place])   ? Truncal ataxia   ? ?Past Surgical History:  ?Procedure Laterality Date  ? BRAIN SURGERY    ? hydrocephalus  ? KIDNEY STONE SURGERY    ? ?Social History:  reports that he has been smoking cigarettes. He has a 20.00 pack-year smoking history. He has never used smokeless tobacco. He reports current alcohol use. He reports current drug use. Frequency: 7.00 times per week. Drug: Marijuana. ? ?Allergies  ?Allergen Reactions  ? Mushroom Extract Complex Hives, Itching and Nausea And Vomiting  ? Penicillins Anaphylaxis, Hives and Swelling  ?  Has patient had a PCN reaction causing immediate rash, facial/tongue/throat swelling, SOB or lightheadedness with hypotension: Yes ?Has patient had a PCN reaction causing severe rash involving mucus membranes or skin necrosis: No ?Has patient had a PCN reaction that required hospitalization: Yes ?Has patient had a PCN reaction occurring within the last 10 years: Yes ?If all of the above answers are "NO", then may proceed with Cephalosporin use. ?Tolerated ceftriaxone 03/24/20  ? Sulfa Antibiotics Anaphylaxis and Hives  ? Clindamycin/Lincomycin Hives  ? ? ?Family History  ?Problem Relation Age of Onset  ? Breast cancer Mother   ? Other Father   ?     unsure of medical history  ? Diabetes Paternal Aunt   ? ? ?Prior to Admission medications   ?Medication Sig Start Date End Date Taking? Authorizing Provider  ?  Blood Glucose Monitoring Suppl (ACCU-CHEK GUIDE) w/Device KIT 1 Device by Does not apply route daily. 12/13/21   Madalyn Rob, MD  ?divalproex (DEPAKOTE) 500 MG DR tablet Please take 1 tablet (500 mg) by mouth every morning, and 2 tablets (1000 mg) at bedtime. ?Patient taking differently: 500-1,000 mg. 519m in the morning, 1,0097mat bedtime 01/11/22   Elgergawy, DaSilver HugueninMD  ?escitalopram  (LEXAPRO) 20 MG tablet Take 20 mg by mouth daily.    [provider]  ?glucose blood (ACCU-CHEK GUIDE) test strip Check blood glucose 4 times daily.Before breakfast,lunch and dinner. Also before bed time. 12/13/21   StMadalyn RobMD  ?insulin isophane & regular human KwikPen (HUMULIN 70/30 KWIKPEN) (70-30) 100 UNIT/ML KwikPen Inject 35 Units into the skin 2 (two) times daily. 01/11/22 02/10/22  Elgergawy, DaSilver HugueninMD  ?Insulin Pen Needle 32G X 4 MM MISC Use to inject insulin up to 4 times daily as needed. 01/11/22   Elgergawy, DaSilver HugueninMD  ?Accu-Chek Softclix Lancets lancets Check blood glucose 3 times a day before meals and before bedtime. 12/13/21   StMadalyn RobMD  ?levothyroxine (SYNTHROID) 50 MCG tablet Take 1 tablet (50 mcg total) by mouth daily before breakfast. 01/11/22   Elgergawy, DaSilver HugueninMD  ?QUEtiapine (SEROQUEL) 200 MG tablet Take 2 tablets (400 mg total) by mouth at bedtime. 01/11/22   Elgergawy, DaSilver HugueninMD  ? ? ?Physical Exam: ?Vitals:  ? 02/05/22 1445 02/05/22 1515 02/05/22 1600 02/05/22 1630  ?BP: 119/70 128/80 124/78 135/79  ?Pulse:  99  78  ?Resp: 13 (!) 25 15 18   ?Temp:      ?TempSrc:      ?SpO2: 97% 98%  99%  ? ?General:  Appears calm and comfortable and is in NAD. Sleeping, but easily woken up and answers questions appropriately  ?Eyes:  PERRL, EOMI, normal lids, iris ?ENT:  grossly normal hearing, lips & tongue, mmm; appropriate dentition ?Neck:  no LAD, masses or thyromegaly; no carotid bruits ?Cardiovascular:  RRR, no m/r/g. No LE edema.  ?Respiratory:   CTA bilaterally with no wheezes/rales/rhonchi.  Normal respiratory effort. ?Abdomen:  soft, NT, ND, NABS ?Back:   normal alignment, no CVAT ?Skin:  no rash or induration seen on limited exam ?Musculoskeletal:  grossly normal tone BUE/BLE, good ROM, no bony abnormality ?Lower extremity:  No LE edema.  Limited foot exam with no ulcerations.  2+ distal pulses. ?Psychiatric:  grossly normal mood and affect, speech fluent and  appropriate, AO to self and place. Tells me he doesn't keep up with date.  ?Neurologic:  CN 2-12 grossly intact, moves all extremities in coordinated fashion, sensation intact. Strength 5/5.  ? ? ?Radiological Exams on Admission: ?Independently reviewed - see discussion in A/P where applicable ? ?CT Head Wo Contrast ? ?Result Date: 02/05/2022 ?CLINICAL DATA:  Altered mental status EXAM: CT HEAD WITHOUT CONTRAST TECHNIQUE: Contiguous axial images were obtained from the base of the skull through the vertex without intravenous contrast. RADIATION DOSE REDUCTION: This exam was performed according to the departmental dose-optimization program which includes automated exposure control, adjustment of the mA and/or kV according to patient size and/or use of iterative reconstruction technique. COMPARISON:  Previous studies including the examination done on 01/05/2022 FINDINGS: Brain: No acute intracranial findings are seen. There is interval clearing of subarachnoid hemorrhage seen in the left posterior cerebral hemisphere and interval resolution of small amount of intraventricular blood since the previous study. There is dilation of third and both lateral ventricles out of proportion  to cortical atrophy. Possibility of normal pressure hydrocephalus is not excluded. Another possibility would be stenosis of aqueduct. Fourth ventricle is not dilated. There are no signs of recent bleeding. Vascular: Unremarkable. Skull: There is previous right frontal craniotomy. Sinuses/Orbits: Unremarkable. Other: None IMPRESSION: There is interval resolution of subarachnoid hemorrhage seen in the posterior left cerebral hemisphere. There is interval resolution of small amount of intraventricular blood. There is dilation of third and both lateral ventricles which may suggest aqueduct stenosis or normal pressure hydrocephalus. This finding has not changed significantly. Electronically Signed   By: Elmer Picker M.D.   On: 02/05/2022 11:21   ? ?DG Chest Portable 1 View ? ?Result Date: 02/05/2022 ?CLINICAL DATA:  seizure EXAM: PORTABLE CHEST 1 VIEW COMPARISON:  January 04, 2022 chest x-ray. FINDINGS: No consolidation. No visible pleural effusions or pneumothora

## 2022-02-05 NOTE — Assessment & Plan Note (Signed)
Likely reactive, no signs of infection ?Monitor fever curve and trend  ?

## 2022-02-05 NOTE — Assessment & Plan Note (Signed)
Has not been taking medication since hospital d/c 3 weeks ago ?Will resume lexapro, seroquel  ?

## 2022-02-05 NOTE — Progress Notes (Signed)
EEG completed, results pending. 

## 2022-02-05 NOTE — ED Provider Notes (Addendum)
[facility] EMERGENCY DEPARTMENT Provider Note   CSN: [id number] Arrival date & time: 02/05/22  1607     History  Chief Complaint  Patient presents with   Seizures    Mark [name] is a 36 y.o. male.   Seizures Patient presents via EMS from home for reported seizure.  This was reportedly witnessed by his roommates.  Medical history includes DM, bipolar disorder, anxiety, depression, hypothyroidism, hydrocephalus, homelessness, and seizures.  He had a recent hospitalization for DKA with a small traumatic SAH/SDH.  He was discharged 3 weeks ago.  EMS reported drug paraphernalia on scene.  BG prior to arrival was elevated.  Patient currently refusing to provide any history.    Home Medications Prior to Admission medications   Medication Sig Start Date End Date Taking? Authorizing Provider  Blood Glucose Monitoring Suppl (ACCU-CHEK GUIDE) w/Device KIT 1 Device by Does not apply route daily. 12/13/21   Albertha Ghee, MD  divalproex (DEPAKOTE) 500 MG DR tablet Please take 1 tablet (500 mg) by mouth every morning, and 2 tablets (1000 mg) at bedtime. Patient taking differently: 500-1,000 mg. 500mg  in the morning, 1,000mg  at bedtime 01/11/22   Elgergawy, Leana Roe, MD  escitalopram (LEXAPRO) 20 MG tablet Take 20 mg by mouth daily.    [provider]  glucose blood (ACCU-CHEK GUIDE) test strip Check blood glucose 4 times daily.Before breakfast,lunch and dinner. Also before bed time. 12/13/21   Albertha Ghee, MD  insulin isophane & regular human KwikPen (HUMULIN 70/30 KWIKPEN) (70-30) 100 UNIT/ML KwikPen Inject 35 Units into the skin 2 (two) times daily. 01/11/22 02/10/22  Elgergawy, Leana Roe, MD  Insulin Pen Needle 32G X 4 MM MISC Use to inject insulin up to 4 times daily as needed. 01/11/22   Elgergawy, Leana Roe, MD  Accu-Chek Softclix Lancets lancets Check blood glucose 3 times a day before meals and before bedtime. 12/13/21   Albertha Ghee, MD  levothyroxine (SYNTHROID)  50 MCG tablet Take 1 tablet (50 mcg total) by mouth daily before breakfast. 01/11/22   Elgergawy, Leana Roe, MD  QUEtiapine (SEROQUEL) 200 MG tablet Take 2 tablets (400 mg total) by mouth at bedtime. 01/11/22   Elgergawy, Leana Roe, MD      Allergies    Mushroom extract complex, Penicillins, Sulfa antibiotics, and Clindamycin/lincomycin    Review of Systems   Review of Systems  Unable to perform ROS: Mental status change  Neurological:  Positive for seizures.   Physical Exam Updated Vital Signs BP 135/79   Pulse 78   Temp 97.8 F (36.6 C) (Oral)   Resp 18   SpO2 99%  Physical Exam Vitals and nursing note reviewed.  Constitutional:      General: He is not in acute distress.    Appearance: Normal appearance. He is well-developed and normal weight. He is not ill-appearing, toxic-appearing or diaphoretic.  HENT:     Head: Normocephalic and atraumatic.     Right Ear: External ear normal.     Left Ear: External ear normal.     Nose: Nose normal.     Mouth/Throat:     Comments: Patient refusing to open mouth Eyes:     Extraocular Movements: Extraocular movements intact.     Conjunctiva/sclera: Conjunctivae normal.  Cardiovascular:     Rate and Rhythm: Normal rate and regular rhythm.     Heart sounds: No murmur heard. Pulmonary:     Effort: Pulmonary effort is normal. No respiratory distress.     Breath  sounds: Normal breath sounds. No wheezing or rales.  Abdominal:     General: Abdomen is flat.     Palpations: Abdomen is soft.     Tenderness: There is no abdominal tenderness.  Musculoskeletal:        General: No swelling.     Cervical back: Neck supple. No rigidity.     Right lower leg: No edema.     Left lower leg: No edema.  Skin:    General: Skin is warm and dry.     Capillary Refill: Capillary refill takes less than 2 seconds.     Coloration: Skin is not jaundiced or pale.  Neurological:     Mental Status: He is alert.     Comments: Patient awake but refusing to  follow commands  Psychiatric:        Speech: He is noncommunicative.        Behavior: Behavior is uncooperative.    ED Results / Procedures / Treatments   Labs (all labs ordered are listed, but only abnormal results are displayed) Labs Reviewed  COMPREHENSIVE METABOLIC PANEL - Abnormal; Notable for the following components:      Result Value   Glucose, Bld <20 (*)    All other components within normal limits  CBC WITH DIFFERENTIAL/PLATELET - Abnormal; Notable for the following components:   WBC 14.8 (*)    Neutro Abs 12.3 (*)    Abs Immature Granulocytes 0.08 (*)    All other components within normal limits  URINALYSIS, ROUTINE W REFLEX MICROSCOPIC - Abnormal; Notable for the following components:   APPearance HAZY (*)    Glucose, UA 50 (*)    Hgb urine dipstick MODERATE (*)    Protein, ur 30 (*)    All other components within normal limits  RAPID URINE DRUG SCREEN, HOSP PERFORMED - Abnormal; Notable for the following components:   Amphetamines POSITIVE (*)    Tetrahydrocannabinol POSITIVE (*)    All other components within normal limits  CK - Abnormal; Notable for the following components:   Total CK 405 (*)    All other components within normal limits  VALPROIC ACID LEVEL - Abnormal; Notable for the following components:   Valproic Acid Lvl 14 (*)    All other components within normal limits  CBG MONITORING, ED - Abnormal; Notable for the following components:   Glucose-Capillary 46 (*)    All other components within normal limits  CBG MONITORING, ED - Abnormal; Notable for the following components:   Glucose-Capillary 272 (*)    All other components within normal limits  CBG MONITORING, ED - Abnormal; Notable for the following components:   Glucose-Capillary 118 (*)    All other components within normal limits  CBG MONITORING, ED - Abnormal; Notable for the following components:   Glucose-Capillary 33 (*)    All other components within normal limits  ETHANOL   MAGNESIUM  BETA-HYDROXYBUTYRIC ACID  TSH  T4, FREE  CBG MONITORING, ED    EKG EKG Interpretation  Date/Time:  Monday February 05 2022 10:02:40 EDT Ventricular Rate:  92 PR Interval:  163 QRS Duration: 85 QT Interval:  374 QTC Calculation: 463 R Axis:   69 Text Interpretation: Sinus rhythm Consider left ventricular hypertrophy Confirmed by Gloris Manchester ([id number]) on 02/05/2022 10:58:05 AM  Radiology CT Head Wo Contrast  Result Date: 02/05/2022 CLINICAL DATA:  Altered mental status EXAM: CT HEAD WITHOUT CONTRAST TECHNIQUE: Contiguous axial images were obtained from the base of the skull through the vertex without  intravenous contrast. RADIATION DOSE REDUCTION: This exam was performed according to the departmental dose-optimization program which includes automated exposure control, adjustment of the mA and/or kV according to patient size and/or use of iterative reconstruction technique. COMPARISON:  Previous studies including the examination done on 01/05/2022 FINDINGS: Brain: No acute intracranial findings are seen. There is interval clearing of subarachnoid hemorrhage seen in the left posterior cerebral hemisphere and interval resolution of small amount of intraventricular blood since the previous study. There is dilation of third and both lateral ventricles out of proportion to cortical atrophy. Possibility of normal pressure hydrocephalus is not excluded. Another possibility would be stenosis of aqueduct. Fourth ventricle is not dilated. There are no signs of recent bleeding. Vascular: Unremarkable. Skull: There is previous right frontal craniotomy. Sinuses/Orbits: Unremarkable. Other: None IMPRESSION: There is interval resolution of subarachnoid hemorrhage seen in the posterior left cerebral hemisphere. There is interval resolution of small amount of intraventricular blood. There is dilation of third and both lateral ventricles which may suggest aqueduct stenosis or normal pressure hydrocephalus.  This finding has not changed significantly. Electronically Signed   By: Ernie Avena M.D.   On: 02/05/2022 11:21   DG Chest Portable 1 View  Result Date: 02/05/2022 CLINICAL DATA:  seizure EXAM: PORTABLE CHEST 1 VIEW COMPARISON:  January 04, 2022 chest x-ray. FINDINGS: No consolidation. No visible pleural effusions or pneumothorax. Cardiomediastinal silhouette is within normal limits. No evidence of acute osseous abnormality. IMPRESSION: No evidence of acute cardiopulmonary disease. Electronically Signed   By: Feliberto Harts M.D.   On: 02/05/2022 10:46   EEG adult  Result Date: 02/05/2022 Charlsie Quest, MD     02/05/2022  5:43 PM Patient Name: Mark [name] MRN: [id number] Epilepsy Attending: Charlsie Quest Referring Physician/Provider: Milon Dikes, MD Date: 02/05/2022 Duration: 24.01 mins Patient history: 36 y.o. 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 EEG to evaluate for seizure. Level of alertness: Awake, asleep AEDs during EEG study: VPA Technical aspects: This EEG study was done with scalp electrodes positioned according to the 10-20 International system of electrode placement. Electrical activity was acquired at a sampling rate of 500Hz  and reviewed with a high frequency filter of 70Hz  and a low frequency filter of 1Hz . EEG data were recorded continuously and digitally stored. Description: No posterior dominant rhythm was seen. Sleep was characterized by vertex waves, sleep spindles (12 to 14 Hz), maximal frontocentral region.  EEG showed continuous generalized 3 to 6 Hz theta-delta slowing. Hyperventilation and photic stimulation were not performed.   ABNORMALITY - Continuous slow, generalized IMPRESSION: This study is suggestive of moderate diffuse encephalopathy, nonspecific  etiology. No seizures or epileptiform discharges were seen throughout the recording. Charlsie Quest    Procedures Procedures    Medications Ordered in ED Medications  valproate (DEPACON) 750 mg in dextrose 5 % 50 mL IVPB (has no administration in time range)  dextrose 50 % solution (has no administration in time range)  dextrose 50 % solution 50 mL (has no administration in time range)  dextrose 10 % infusion (has no administration in time range)  nicotine (NICODERM CQ - dosed in mg/24 hours) patch 21 mg (has no administration in time range)  lactated ringers bolus 1,000 mL (0 mLs Intravenous Stopped 02/05/22 1221)  LORazepam (ATIVAN) injection 2 mg (2 mg Intravenous Given 02/05/22 1116)  dextrose 50 %  solution (  Given by Other 02/05/22 1128)  lactated ringers bolus 1,000 mL (0 mLs Intravenous Stopped 02/05/22 1647)  valproate (DEPACON) 500 mg in dextrose 5 % 50 mL IVPB (500 mg Intravenous New Bag/Given 02/05/22 1705)  ketorolac (TORADOL) 30 MG/ML injection 30 mg (30 mg Intravenous Given 02/05/22 1651)  fentaNYL (SUBLIMAZE) injection 25 mcg (25 mcg Intravenous Given 02/05/22 1651)    ED Course/ Medical Decision Making/ A&P Clinical Course as of 02/05/22 1806  Mon Feb 05, 2022  1650 On my assessment pt is awake and answering questions, reports "My head hurts," but is willing to lie still for EEG if pain meds given. [MT]    Clinical Course User Index [MT] Trifan, Kermit Balo, MD                           Medical Decision Making Amount and/or Complexity of Data Reviewed Labs: ordered. Radiology: ordered.  Risk Prescription drug management. Decision regarding hospitalization.   This patient presents to the ED for concern of seizure, this involves an extensive number of treatment options, and is a complaint that carries with it a high risk of complications and morbidity.  The differential diagnosis includes medication nonadherence, trauma, substance use, alcohol withdrawal   Co  morbidities that complicate the patient evaluation  Polysubstance abuse, ICH, bipolar disorder, seizure disorder, depression, obstructive hydrocephalus, diabetes, DVT   Additional history obtained:  Additional history obtained from N/A External records from outside source obtained and reviewed including EMR   Lab Tests:  I Ordered, and personally interpreted labs.  The pertinent results include: Initial hypoglycemia, leukocytosis, mild elevation in CK, subtherapeutic Depakote level, UDS positive for amphetamines and THC   Imaging Studies ordered:  I ordered imaging studies including chest x-ray, CT head I independently visualized and interpreted imaging which showed no acute findings, interval improvement of previous SAH, lateral ventricle and third ventricle enlargement, also seen on prior imaging I agree with the radiologist interpretation   Cardiac Monitoring: / EKG:  The patient was maintained on a cardiac monitor.  I personally viewed and interpreted the cardiac monitored which showed an underlying rhythm of: Sinus rhythm   Consultations Obtained:  I requested consultation with the neurologist, Dr. Jerrell Belfast,  and discussed lab and imaging findings as well as pertinent plan - they recommend: MRI, EEG, and admission for observation   Problem List / ED Course / Critical interventions / Medication management  Patient is a 36 year old male with history of polysubstance abuse, seizure disorder, diabetes, who had a recent hospitalization last month for seizures and encephalopathy followed by another hospitalization for DKA and subarachnoid hemorrhage, presents to the ED following a witnessed seizure episode by his roommates.  He arrives via EMS.  EMS reports postictal state with some mild agitation during transit.  EMS also reported hyperglycemia prior to arrival.  On initial exam, patient appears to be awake but is unable/unwilling to answer any questions.  There is no external  evidence of trauma.  Patient was placed on bedside cardiac monitor.  EKG was obtained and IV fluids were ordered.  Due to his history of substance use, patient was given 2 mg of Ativan.  On initial CBG check in the ED, patient was found to be hypoglycemic.  D50 was given.  Patient's subsequent blood sugar was in the range of 250.  Plan will be for frequent CBG checks.  I spoke with pharmacy regarding loading with Depakote.  They recommended 500 mg  initially.  This was ordered.  Patient underwent diagnostic work-up.  Labs are notable for amphetamines and THC on UDS, leukocytosis, and a subtherapeutic Depakote level.  Patient remains somnolent and disoriented while in the ED.  I spoke with neurology regarding what appears to be a prolonged postictal state.  They did recommend MRI, EEG, and admission for observation.  Care of patient was signed out to oncoming ED provider. I ordered medication including IV fluids for hydration, D50 for hypoglycemia, Depakote for seizure prophylaxis Reevaluation of the patient after these medicines showed that the patient improved I have reviewed the patients home medicines and have made adjustments as needed   Social Determinants of Health:  History of polysubstance use, multiple hospital admissions for similar presentations, history of leaving AMA   CRITICAL CARE Performed by: Gloris Manchester   Total critical care time: 32 minutes  Critical care time was exclusive of separately billable procedures and treating other patients.  Critical care was necessary to treat or prevent imminent or life-threatening deterioration.  Critical care was time spent personally by me on the following activities: development of treatment plan with patient and/or surrogate as well as nursing, discussions with consultants, evaluation of patient's response to treatment, examination of patient, obtaining history from patient or surrogate, ordering and performing treatments and interventions,  ordering and review of laboratory studies, ordering and review of radiographic studies, pulse oximetry and re-evaluation of patient's condition.       Final Clinical Impression(s) / ED Diagnoses Final diagnoses:  Seizure (HCC)  Hypoglycemia    Rx / DC Orders ED Discharge Orders     None         Gloris Manchester, MD 02/05/22 1807    Gloris Manchester, MD 02/05/22 [phone]

## 2022-02-05 NOTE — Assessment & Plan Note (Signed)
Nicotine patch, counseled on cessation  ?

## 2022-02-05 NOTE — Assessment & Plan Note (Addendum)
Interval resolution of SAH in posterior left hemisphere and interval resolution of small amount of intraventricular blood ?Neurosurgery consulted during last hospital stay in march with no intervention/fu outpatient  ? ?

## 2022-02-05 NOTE — ED Triage Notes (Signed)
Pt arrived via [facility] from home for seizure. Per EMS, pt is A&Ox4 and agitated. Per EMS, a pipe with a white substance in it was found on scene, but pt denies substance use. Per EMS, pt lives with a roommate. Pt has been growling and is now doing intermittent jerking.  ?

## 2022-02-05 NOTE — Assessment & Plan Note (Signed)
-  per prior hospital d/c note: MRI was planned but patient was uncooperative and was not able to be done. ?Since this is an incidental finding-MRI has been deferred to outpatient setting/PCP ?-with poor compliance, may see if more keen to have done during this stay  ?

## 2022-02-05 NOTE — Assessment & Plan Note (Addendum)
UDS + for amphetamines and THC ?Counseled on cessation  ?

## 2022-02-05 NOTE — ED Provider Notes (Signed)
Pt signed out to me by Dr Durwin Nora ? ?Briefly 36 yo male w/ hx of seizures, presenting with seizure in ED ?Still not to baseline mental status ? ?Hx of subdural bleed last month, hospitalist ? ?On depakote at home ? ?Received 2 mg ativan here for question of subclinical seizure (?), and depakote ? ?UDS positive for THC, amphetamines ? ?Initially hypoglycemic on arrival, received D50 ? ?Patient remains confused - neurologist Dr Wilford Corner recommending overnight monitoring, MRI brain, EEG ? ?Clinical Course as of 02/05/22 1727  ?Mon Feb 05, 2022  ?1650 On my assessment pt is awake and answering questions, reports "My head hurts," but is willing to lie still for EEG if pain meds given. [MT]  ?  ?Clinical Course User Index ?[MT] Terald Sleeper, MD  ? ?Repeat glucose ordered ? ?530 pm - D10 infusion ordered for persistent hypoglycemia; patient admitted to hospitalist  ? ?.Critical Care ?Performed by: Terald Sleeper, MD ?Authorized by: Terald Sleeper, MD  ? ?Critical care provider statement:  ?  Critical care time (minutes):  35 ?  Critical care time was exclusive of:  Separately billable procedures and treating other patients ?  Critical care was necessary to treat or prevent imminent or life-threatening deterioration of the following conditions:  Metabolic crisis ?  Critical care was time spent personally by me on the following activities:  Ordering and performing treatments and interventions, ordering and review of laboratory studies, ordering and review of radiographic studies, pulse oximetry, review of old charts, examination of patient and evaluation of patient's response to treatment ?Comments:  ?   Hypoglycemia management ? ?  ?Terald Sleeper, MD ?02/05/22 1727 ? ?

## 2022-02-05 NOTE — Significant Event (Signed)
CBG 344, stopping D10, repeat CBG in 1h. ? ?Due to hypoglycemia, not ordering insulin yet, but may need shortly depending on what CBG does in the next hour or so off of D10. ?

## 2022-02-06 ENCOUNTER — Other Ambulatory Visit: Payer: Self-pay

## 2022-02-06 ENCOUNTER — Observation Stay (HOSPITAL_COMMUNITY): Payer: Medicaid Other

## 2022-02-06 DIAGNOSIS — D72829 Elevated white blood cell count, unspecified: Secondary | ICD-10-CM

## 2022-02-06 DIAGNOSIS — R569 Unspecified convulsions: Secondary | ICD-10-CM | POA: Insufficient documentation

## 2022-02-06 DIAGNOSIS — G40919 Epilepsy, unspecified, intractable, without status epilepticus: Secondary | ICD-10-CM | POA: Diagnosis not present

## 2022-02-06 DIAGNOSIS — S06360A Traumatic hemorrhage of cerebrum, unspecified, without loss of consciousness, initial encounter: Secondary | ICD-10-CM

## 2022-02-06 DIAGNOSIS — F1910 Other psychoactive substance abuse, uncomplicated: Secondary | ICD-10-CM

## 2022-02-06 DIAGNOSIS — F3189 Other bipolar disorder: Secondary | ICD-10-CM

## 2022-02-06 DIAGNOSIS — E10649 Type 1 diabetes mellitus with hypoglycemia without coma: Secondary | ICD-10-CM

## 2022-02-06 DIAGNOSIS — R451 Restlessness and agitation: Secondary | ICD-10-CM

## 2022-02-06 DIAGNOSIS — E038 Other specified hypothyroidism: Secondary | ICD-10-CM

## 2022-02-06 DIAGNOSIS — F17200 Nicotine dependence, unspecified, uncomplicated: Secondary | ICD-10-CM

## 2022-02-06 LAB — CBG MONITORING, ED
Glucose-Capillary: 430 mg/dL — ABNORMAL HIGH (ref 70–99)
Glucose-Capillary: 455 mg/dL — ABNORMAL HIGH (ref 70–99)
Glucose-Capillary: 374 mg/dL — ABNORMAL HIGH (ref 70–99)
Glucose-Capillary: 183 mg/dL — ABNORMAL HIGH (ref 70–99)
Glucose-Capillary: 152 mg/dL — ABNORMAL HIGH (ref 70–99)
Glucose-Capillary: 141 mg/dL — ABNORMAL HIGH (ref 70–99)
Glucose-Capillary: 503 mg/dL (ref 70–99)

## 2022-02-06 LAB — BASIC METABOLIC PANEL
Anion gap: 8 (ref 5–15)
BUN: 8 mg/dL (ref 6–20)
CO2: 24 mmol/L (ref 22–32)
Calcium: 8.4 mg/dL — ABNORMAL LOW (ref 8.9–10.3)
Chloride: 100 mmol/L (ref 98–111)
Creatinine, Ser: 1.18 mg/dL (ref 0.61–1.24)
GFR, Estimated: >60 mL/min (ref >60)
Glucose, Bld: 482 mg/dL — ABNORMAL HIGH (ref 70–99)
Potassium: 4.1 mmol/L (ref 3.5–5.1)
Sodium: 132 mmol/L — ABNORMAL LOW (ref 135–145)

## 2022-02-06 LAB — CBC
HCT: 35.9 % — ABNORMAL LOW (ref 39.0–52.0)
Hemoglobin: 11.9 g/dL — ABNORMAL LOW (ref 13.0–17.0)
MCH: 31.2 pg (ref 26.0–34.0)
MCHC: 33.1 g/dL (ref 30.0–36.0)
MCV: 94 fL (ref 80.0–100.0)
Platelets: 200 10*3/uL (ref 150–400)
RBC: 3.82 MIL/uL — ABNORMAL LOW (ref 4.22–5.81)
RDW: 12.5 % (ref 11.5–15.5)
WBC: 9.8 10*3/uL (ref 4.0–10.5)
nRBC: 0 % (ref 0.0–0.2)

## 2022-02-06 MED ORDER — INSULIN ASPART 100 UNIT/ML IJ SOLN: 0.0000 [IU] | Freq: Three times a day (TID) | INTRAMUSCULAR | Status: DC

## 2022-02-06 MED ORDER — HALOPERIDOL LACTATE 5 MG/ML IJ SOLN: 2.0000 mg | Freq: Four times a day (QID) | INTRAMUSCULAR | Status: DC | PRN

## 2022-02-06 MED ORDER — VALPROATE SODIUM 100 MG/ML IV SOLN: 500.0000 mg | Freq: Two times a day (BID) | INTRAVENOUS | Status: DC

## 2022-02-06 MED ORDER — LEVOTHYROXINE SODIUM 100 MCG/5ML IV SOLN: 25.0000 ug | Freq: Every day | INTRAVENOUS | Status: DC

## 2022-02-06 MED ORDER — INSULIN NPH (HUMAN) (ISOPHANE) 100 UNIT/ML ~~LOC~~ SUSP
20.0000 [IU] | Freq: Two times a day (BID) | SUBCUTANEOUS | Status: DC
  Administered 2022-02-06: 20 [IU] via SUBCUTANEOUS
  Filled 2022-02-06: qty 10

## 2022-02-06 MED ORDER — QUETIAPINE FUMARATE 100 MG PO TABS: 100.0000 mg | ORAL_TABLET | Freq: Every morning | ORAL | Status: DC

## 2022-02-06 MED ORDER — INSULIN ASPART 100 UNIT/ML IJ SOLN: 20.0000 [IU] | Freq: Once | INTRAMUSCULAR | Status: DC

## 2022-02-06 MED ORDER — QUETIAPINE FUMARATE 300 MG PO TABS
300.0000 mg | ORAL_TABLET | Freq: Every day | ORAL | Status: DC
Start: 2022-02-06 — End: 2022-02-06

## 2022-02-06 MED ORDER — INSULIN ASPART 100 UNIT/ML IJ SOLN
5.0000 [IU] | Freq: Once | INTRAMUSCULAR | Status: AC
  Administered 2022-02-06: 5 [IU] via SUBCUTANEOUS

## 2022-02-06 MED ORDER — INSULIN ASPART 100 UNIT/ML IJ SOLN
0.0000 [IU] | INTRAMUSCULAR | Status: DC
  Administered 2022-02-06: 9 [IU] via SUBCUTANEOUS

## 2022-02-06 NOTE — ED Notes (Signed)
I attempted to get pt's blood and started screaming and saying, "that fucking hurts." I told him I was getting his blood and to stop cussing and he said, I don't give a fuck. Fuck that. Fuck you." I notified security and I had the attending physician paged. ?  ?

## 2022-02-06 NOTE — Progress Notes (Addendum)
Inpatient Diabetes Program Recommendations ? ?AACE/ADA: New Consensus Statement on Inpatient Glycemic Control (2015) ? ?Target Ranges:  Prepandial:   less than 140 mg/dL ?     Peak postprandial:   less than 180 mg/dL (1-2 hours) ?     Critically ill patients:  140 - 180 mg/dL  ? ?Lab Results  ?Component Value Date  ? GLUCAP 141 (H) 02/06/2022  ? HGBA1C 8.0 (H) 01/04/2022  ? ? ?Review of Glycemic Control ? Latest Reference Range & Units 02/06/22 03:04 02/06/22 03:59 02/06/22 05:26 02/06/22 06:13 02/06/22 08:31  ?Glucose-Capillary 70 - 99 mg/dL 025 (H) 852 (H) 778 (H) 152 (H) 141 (H)  ?(H): Data is abnormally high ?Diabetes history: Type 1 DM ?Outpatient Diabetes medications: Novolog 70/30 35 units BID ?Current orders for Inpatient glycemic control: Novolog 0-9 units Q4H, NPH 20 units BID ? ?Inpatient Diabetes Program Recommendations:   ?Noted consult. Dm team familiar with this patient. Recently left hospital AMA and threatening to do so this AM. Psychosocial barriers limiting control outpatient. ? ?Now that patient is eating, consider: ?-Novolog 0-9 units TID & HS ?-Novolog 3 units TID (Assuming patient is consuming >50% of meals).  ?Following.  ? ?Thanks, ?Lujean Rave, MSN, RNC-OB ?Diabetes Coordinator ?[phone] (8a-5p) ? ?

## 2022-02-06 NOTE — ED Notes (Signed)
Pt refused MRI.

## 2022-02-06 NOTE — ED Notes (Signed)
I went into pt's room and pt was ripping everything off and saying he is leaving. He said, "that black bitch came in here starting with me and talking about my mother." I told him that he is not going to talk that way and that that is not appropriate. He got up out of bed and started getting dressed and throwing things around. I called security. When security came the pt stated he was going to stay and let us perform our jobs on him.  ?

## 2022-02-06 NOTE — Discharge Summary (Signed)
Physician Discharge Summary  ?Mark [name] JKK:[id number] DOB: 1985-12-09 DOA: 02/05/2022 ? ?PCP: Rema Fendt, NP ? ?Admit date: 02/05/2022 ?Discharge date: 02/06/2022 ? ?  Patient left AMA ? ?Time spent: 25 minutes ? ?Recommendations for Outpatient Follow-up:  ?Patient left AMA ? ? ?Discharge Diagnoses:  ?Principal Problem: ?  Breakthrough seizure (HCC) ?Active Problems: ?  Uncontrolled type 1 diabetes mellitus with hypoglycemia, with long-term current use of insulin (HCC) ?  ICH (intracerebral hemorrhage) (HCC) ?  Other bipolar disorder (HCC) ?  Lytic lesion lesion seen on T7 vertebral body on CT C-spine on 2/21 ?  Hypothyroidism ?  Polysubstance abuse (HCC) ?  Tobacco dependence ?  Leukocytosis ? ? ?Discharge Condition: Patient left AMA ? ?Diet recommendation: Patient left AMA ? ?There were no vitals filed for this visit. ? ?History of present illness:  ?HPI per Dr. Artis Flock ?Mark [name] is a 36 y.o. male with medical history significant of seizures; hydrocephalus, bipolar, polysubstance abuse and DM type 1 who presented to ED with complaints of seizure and also found to have hypoglycemia.  ?He had 3 witnessed seizures, including 2 with EMS. Continues to be intermittently be confused while in ED. He apparently has not been taking any medication since his hospital discharge three weeks ago. He states he has a bad headache, but denies any other complaints. He would like to eat. He is alert and talking with no confusion on exam.  ?  ?Recently hospitalized from 3/16-3/23/23 for DKA with traumatic small SAH/SDH on neuroimaging.  ?  ?  ?Denies any fever/chills, vision changes, chest pain or palpitations, shortness of breath or cough, abdominal pain, N/V/D, dysuria or leg swelling.  ?  ?Smokes 1PPD, no alcohol. +MJ. States he took someones adhd medication  ?  ?ER Course:  vitals: afebrile, bp: 144/94, HR; 93, RR: 17, oxygen: 98% RA ?Pertinent labs: wbc: 14.8, glucose <20, CK 405, UDS: + amphetamines, +THC  ?CTH:  interval resolution of SAH in posterior left hemisphere. Interval resolution of small amount of intraventricular blood. There is dilation of third and both lateral ventricles which may ?suggest aqueduct stenosis or normal pressure hydrocephalus. This ?finding has not changed significantly. ?In ED: neurology consulted. Given 2L IVF, IV valproate. MRI brain and eeg ordered. Dextrose given>D10 ?  ?Hospital Course:  ?#1 breakthrough seizures ?-Patient presented with history of seizures, noted to have 3 witnessed seizures prior to admission for likely set in the setting of medication noncompliance as noted that he not been taken in his medication in the past 3 weeks and hypoglycemia. ?-Patient admitted, head CT done with interval resolution of subarachnoid hemorrhage seen in posterior left cerebral hemisphere.  Interval resolution of small amount of intraventricular blood.  Dilation of the third and both lateral ventricles which may suggest aqueduct stenosis of normal pressure hydrocephalus.  This finding has not changed significantly. ?-MRI ordered which patient refused. ?-Patient placed on IV valproic acid and seizure precautions. ?-Neurology consult who recommended MRI however patient refused. ?-EEG also recommended. ?-Patient was to be placed on IV Depakote twice daily. ?-Patient subsequently left AMA. ? ?2.  Uncontrolled type 1 diabetes with hyperglycemia, long-term use of insulin. ?-Patient noted to have presented with a blood glucose level of < 20. ?-Patient subsequently started on dextrose 10 IV and CBGs checked hourly. ?-Patient subsequently started to have hypoglycemic episodes and as such D10 was discontinued and patient placed on insulin. ?-Patient subsequently left AMA. ? ?3.  ICH ?-Head CT done on admission showed interval resolution of  SAH in posterior left hemisphere and interval resolution of small amount of intraventricular blood. ?-Patient left AMA. ? ?4.  Bipolar disorder ?-Patient noted to have been  noncompliant with his medications since discharge 3 weeks prior to admission. ?-Patient admitted and placed on Lexapro and Seroquel. ?-Psychiatry consulted and patient seen in consultation by Dr. Gasper Sells. ?-It was felt patient was low risk of self-harm, medication recommendations made. ?-Patient subsequently left AMA. ? ?5.  Hypothyroidism ?-Patient placed on home regimen Synthroid. ?-Patient left AMA. ? ?6.  Polysubstance abuse ?-UDS noted to be positive for amphetamines and THC. ?-Patient counseled on cessation. ?-Patient left AMA. ? ?7.  Leukocytosis ?-Felt likely to be reactive. ?-Patient with no signs or symptoms of infection. ?-Patient left AMA ? ? ? ?Procedures: ?CT head 02/05/2022 ?Chest x-ray 02/05/2022 ? ? ?Consultations: ?Psychiatry: Dr. Gasper Sells 02/06/2022 ?Neurology: Dr. Wilford Corner 02/05/2022 ? ?Discharge Exam: ?Vitals:  ? 02/06/22 1100 02/06/22 1200  ?BP: 114/79 109/83  ?Pulse: 82 68  ?Resp: 20 20  ?Temp:    ?SpO2: 100% 99%  ? ? ?General: NAD ?Cardiovascular: RRR no murmurs rubs or gallops. ?Respiratory: CTA B ? ?Discharge Instructions ? ? ? ? ?Allergies  ?Allergen Reactions  ? Mushroom Extract Complex Hives, Itching and Nausea And Vomiting  ? Penicillins Anaphylaxis, Hives and Swelling  ?  Has patient had a PCN reaction causing immediate rash, facial/tongue/throat swelling, SOB or lightheadedness with hypotension: Yes ?Has patient had a PCN reaction causing severe rash involving mucus membranes or skin necrosis: No ?Has patient had a PCN reaction that required hospitalization: Yes ?Has patient had a PCN reaction occurring within the last 10 years: Yes ?If all of the above answers are "NO", then may proceed with Cephalosporin use. ?Tolerated ceftriaxone 03/24/20  ? Sulfa Antibiotics Anaphylaxis and Hives  ? Clindamycin/Lincomycin Hives  ? ? ? ? ?The results of significant diagnostics from this hospitalization (including imaging, microbiology, ancillary and laboratory) are listed below for reference.    ? ?Significant Diagnostic Studies: ?CT Head Wo Contrast ? ?Result Date: 02/05/2022 ?CLINICAL DATA:  Altered mental status EXAM: CT HEAD WITHOUT CONTRAST TECHNIQUE: Contiguous axial images were obtained from the base of the skull through the vertex without intravenous contrast. RADIATION DOSE REDUCTION: This exam was performed according to the departmental dose-optimization program which includes automated exposure control, adjustment of the mA and/or kV according to patient size and/or use of iterative reconstruction technique. COMPARISON:  Previous studies including the examination done on 01/05/2022 FINDINGS: Brain: No acute intracranial findings are seen. There is interval clearing of subarachnoid hemorrhage seen in the left posterior cerebral hemisphere and interval resolution of small amount of intraventricular blood since the previous study. There is dilation of third and both lateral ventricles out of proportion to cortical atrophy. Possibility of normal pressure hydrocephalus is not excluded. Another possibility would be stenosis of aqueduct. Fourth ventricle is not dilated. There are no signs of recent bleeding. Vascular: Unremarkable. Skull: There is previous right frontal craniotomy. Sinuses/Orbits: Unremarkable. Other: None IMPRESSION: There is interval resolution of subarachnoid hemorrhage seen in the posterior left cerebral hemisphere. There is interval resolution of small amount of intraventricular blood. There is dilation of third and both lateral ventricles which may suggest aqueduct stenosis or normal pressure hydrocephalus. This finding has not changed significantly. Electronically Signed   By: Ernie Avena M.D.   On: 02/05/2022 11:21  ? ?DG Chest Portable 1 View ? ?Result Date: 02/05/2022 ?CLINICAL DATA:  seizure EXAM: PORTABLE CHEST 1 VIEW COMPARISON:  January 04, 2022 chest  x-ray. FINDINGS: No consolidation. No visible pleural effusions or pneumothorax. Cardiomediastinal silhouette is  within normal limits. No evidence of acute osseous abnormality. IMPRESSION: No evidence of acute cardiopulmonary disease. Electronically Signed   By: Feliberto HartsFrederick S Jones M.D.   On: 02/05/2022 10:46  ? ?EEG adult ? ?Re

## 2022-02-06 NOTE — ED Notes (Signed)
Patient continuing to be verbally abusive to staff, noncompliant with plan of care but alert and oriented. [employer] and security remain at bedside ?
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
## 2022-02-06 NOTE — ED Notes (Signed)
Pt refusing medication. Pt verbally escalating and cursing at RN.   ?

## 2022-02-06 NOTE — ED Notes (Signed)
Pt was given a [place] bag as per Dr Janee Morn verbal order. ?

## 2022-02-06 NOTE — Progress Notes (Addendum)
NEUROLOGY CONSULTATION PROGRESS NOTE  ? ?Date of service: February 06, 2022 ?Patient Name: Mark [name] ?MRN:  [id number] ?DOB:  10/13/1986 ? ?Brief HPI  ?Mark [name] is a 36 y.o. 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  ?  ?He presented to [facility] 01/[facility]/2022 for reported seizure, then admitted 01/04/2022-01/11/2022 for DKA. During that time he was switched from keppra to depakote and d/c'd on depakote 500 mg qAM & 1000 mg qPM.   ?  ?On assessment, patient had eyes closed, awake and responsive to verbal stimuli. Poor engagement on eval, but will answer questions about dogs. Declined to answer orientation questions.  ?  ?VPA lvl subtherapeutic, UDS + amphetamines and THC, glc 20.  ?  ?Interval Hx  ?No family at bedside. ? ?Patient was seen awake and alert, in no respiratory distress.  He is dysphoric, reporting that he is very hungry and demand more food.  He also brought up concerns of posterior right leg pain. ?His last memory was of him lying in his bed with his door locked at home.  Reported no memory of the day prior or how he got to the hospital. ?He reported that there is no cost issue with side effect issues with home Depakote, reported he just has issues with having to take it.  His biggest concern is his poor memory, where he reported forgetting multiple things throughout the day. ?Reported using meth about twice a month to help with concentration and energy. ?He is disinterested and smoking cessation currently. ?York Spaniel he will undergo an MRI he gets food. ? ?Vitals  ? ?Vitals:  ? 02/06/22 0800 02/06/22 0900 02/06/22 1000 02/06/22 1100  ?BP: 96/70 107/80 105/79 114/79  ?Pulse: 91 99 86 82  ?Resp: 14 (!) 24 (!) 22 20  ?Temp:      ?TempSrc:      ?SpO2: 99% 100% 100% 100%  ?  ? ?There is no height or weight on file to calculate  BMI. ? ?Physical Exam  ?General: Laying comfortably in bed; in no acute distress.  ?Pulmonary: Symmetric chest rise. Non-labored respiratory effort.  ?Ext: Lesion on posterior right lower extremity that is tender to touch, erythematous.  ?Musculoskeletal: Normal digits and nails by inspection. No clubbing.  ? ?Neurologic Examination  ?Mental status/Cognition:  ?Alert. Oriented to self, place, month and year. ?Good attention-days of the week backwards. ?Speech/language:  ?Fluent, thought content appropriate.  ?Comprehension intact-able to follow 3 step commands without difficulty.  ?Object naming intact, repetition intact.    ? ?Cranial Nerves: ?II: No VF deficits.  ?III,IV, VI: ptosis not present, extra-ocular motions intact bilaterally. pupils equal, round, reactive to light and accommodation, no gaze preference or deviation. no nystagmus  ?V: facial light touch sensation normal bilaterally ?VII: Left lower lip droop at rest.  Symmetric when smiling.  ?VIII: hearing normal bilaterally ?IX,X: cough and gag intact. uvula rises symmetrically.  ?XI: bilateral shoulder shrug and head turn ?XII: midline tongue extension ?Motor: ?R  UE 5/5 LE 5/5  ?L UE 5/5 LE 5/5  ?normal tone throughout. normal bulk throughout. no atrophy noted.  No tremors ? ?Sensory: Pinprick and light touch intact throughout bilaterally ? ?Deep Tendon Reflexes: 2+ and symmetric throughout.  No clonus ? ?Coordination/Complex Motor:  ?- Finger to Nose intact ?- Rapid alternating movement intact ?- Heel to shin intact ?- Gait: deferred ? ?  Labs  ? ?Basic Metabolic Panel:  ?Lab Results  ?Component Value Date  ? NA 132 (L) 02/06/2022  ? K 4.1 02/06/2022  ? CO2 24 02/06/2022  ? GLUCOSE 482 (H) 02/06/2022  ? BUN 8 02/06/2022  ? CREATININE 1.18 02/06/2022  ? CALCIUM 8.4 (L) 02/06/2022  ? GFRNONAA >60 02/06/2022  ? GFRAA >60 06/26/2020  ? ?HbA1c:  ?Lab Results  ?Component Value Date  ? HGBA1C 8.0 (H) 01/04/2022  ? ?LDL:  ?Lab Results  ?Component Value Date  ?  LDLCALC 153 (H) 03/05/2020  ? ?Urine Drug Screen:  ?   ?Component Value Date/Time  ? LABOPIA NONE DETECTED 02/05/2022 1215  ? COCAINSCRNUR NONE DETECTED 02/05/2022 1215  ? COCAINSCRNUR NONE DETECTED 05/31/2018 2003  ? LABBENZ NONE DETECTED 02/05/2022 1215  ? AMPHETMU POSITIVE (A) 02/05/2022 1215  ? THCU POSITIVE (A) 02/05/2022 1215  ? LABBARB NONE DETECTED 02/05/2022 1215  ?  ?Alcohol Level  ?   ?Component Value Date/Time  ? ETH <10 02/05/2022 1020  ? ?No results found for: PHENYTOIN, ZONISAMIDE, LAMOTRIGINE, LEVETIRACETA ?Lab Results  ?Component Value Date  ? VALPROATE 14 (L) 02/05/2022  ? ? ?Imaging and Diagnostic studies  ? ?CT Head without contrast: 02/05/2022 ?IMPRESSION: There is interval resolution of subarachnoid hemorrhage seen in the posterior left cerebral hemisphere. There is interval resolution of small amount of intraventricular blood. There is dilation of third and both lateral ventricles which may suggest aqueduct stenosis or normal pressure hydrocephalus ?  ?rEEG: 02/05/2022 ?- Continuous slow, generalized ?This study is suggestive of moderate diffuse encephalopathy, nonspecific etiology. No seizures or epileptiform discharges were seen throughout the recording. ? ?MRI brain wwo: ?Patient refused. ? ?Impression  ? ?Mark [name] is a 36 y.o. male with PMH significant for epilepsy on Depakote, polysubstance abuse (tobacco, THC, amphetamines, opioids), DM1, hypothyroidism, MDD/GAD, ICH (12/29/2019), VTEs presented to [facility] (02/05/2022) via EMS for seizure at home witness by roommates.  ? ?Seizure, likely induced in the setting of medication nonadherent, amphetamine abuse, hypoglycemia. He was d/c'd last month on Depakote 1500mg  daily. Admission VPA level subtherapeutic, UDS +amphetamine and THC. ? ?IMP ?-Breakthrough sz ?-Non compliance to meds ?-Substance use ?-Prior SI ?-Hypoglycemia ? ?Recommendations  ?- Seizure precautions ?- ED loaded patient with 500 mg VPA. Give additional 750mg  VPA for total  1250mg . ?-Continue Depakote 500 twice daily.  Check level today. ?- MR brain w+w/o at some point-initially refused ?- Once more awake, needs to be plugged into psychiatry outpatient practice. ?- Has had previous SI and attempts with OD including OD with insulin. Management of DM/sugars per IM. ?- We will continue to follow ?______________________________________________________________________ ? ?Thank you for the opportunity to take part in the care of this patient. If you have any further questions, please contact the neurology consultation attending. ? ?Signed: ? , DO ?Resident, PGY-1 ?[facility] ?02/06/2022, 12:01 PM   ? ?Attending Neurohospitalist Addendum ?Patient seen and examined with APP/Resident. ?Agree with the history and physical as documented above. ?Agree with the plan as documented, which I helped formulate. ?I have independently reviewed the chart, obtained history, review of systems and examined the patient.I have personally reviewed pertinent head/neck/spine imaging (CT/MRI). ?Please feel free to call with any questions. ? ?-- ?Princess Bruins, MD ?Neurologist ?[facility] ?Pager: [phone] ? ? ?

## 2022-02-06 NOTE — ED Notes (Signed)
Breakfast order placed ?

## 2022-02-06 NOTE — ED Notes (Addendum)
I was trying to get a CBG on PT. The PT became verbally hostile and started cursing at this NT, PT than started calling racist names. PT than started throwing items and told  me to get the F'''k out. This NT leeft the room and reported the PT actions to the RN. ?

## 2022-02-07 NOTE — Progress Notes (Signed)
Erroneous encounter

## 2022-02-12 ENCOUNTER — Encounter: Payer: Medicaid Other | Admitting: Family

## 2022-02-12 DIAGNOSIS — Z09 Encounter for follow-up examination after completed treatment for conditions other than malignant neoplasm: Secondary | ICD-10-CM

## 2022-02-12 DIAGNOSIS — R569 Unspecified convulsions: Secondary | ICD-10-CM

## 2022-02-12 DIAGNOSIS — E10649 Type 1 diabetes mellitus with hypoglycemia without coma: Secondary | ICD-10-CM

## 2022-03-07 NOTE — Progress Notes (Signed)
Erroneous encounter-disregard

## 2022-03-12 ENCOUNTER — Encounter: Payer: Medicaid Other | Admitting: Family

## 2022-10-09 ENCOUNTER — Other Ambulatory Visit: Payer: Self-pay
